# Patient Record
Sex: Male | Born: 1965 | Race: White | Hispanic: No | Smoking: Current every day smoker
Health system: Southern US, Community
[De-identification: ages and names within clinical notes are randomized; demographics above are authoritative.]

## PROBLEM LIST (undated history)

## (undated) ENCOUNTER — Emergency Department (HOSPITAL_COMMUNITY): Admission: EM | Payer: Self-pay | Source: Home / Self Care

## (undated) DIAGNOSIS — I10 Essential (primary) hypertension: Secondary | ICD-10-CM

## (undated) DIAGNOSIS — F32A Depression, unspecified: Secondary | ICD-10-CM

## (undated) DIAGNOSIS — R4589 Other symptoms and signs involving emotional state: Secondary | ICD-10-CM

## (undated) DIAGNOSIS — F329 Major depressive disorder, single episode, unspecified: Secondary | ICD-10-CM

## (undated) DIAGNOSIS — K7010 Alcoholic hepatitis without ascites: Secondary | ICD-10-CM

## (undated) DIAGNOSIS — K922 Gastrointestinal hemorrhage, unspecified: Secondary | ICD-10-CM

## (undated) DIAGNOSIS — R569 Unspecified convulsions: Secondary | ICD-10-CM

## (undated) DIAGNOSIS — K859 Acute pancreatitis without necrosis or infection, unspecified: Secondary | ICD-10-CM

## (undated) DIAGNOSIS — F1010 Alcohol abuse, uncomplicated: Secondary | ICD-10-CM

## (undated) DIAGNOSIS — R4689 Other symptoms and signs involving appearance and behavior: Secondary | ICD-10-CM

## (undated) HISTORY — PX: LEG SURGERY: SHX1003

## (undated) HISTORY — PX: HERNIA REPAIR: SHX51

---
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
## 2015-11-03 ENCOUNTER — Emergency Department (HOSPITAL_COMMUNITY): Payer: Self-pay

## 2015-11-03 ENCOUNTER — Encounter (HOSPITAL_COMMUNITY): Payer: Self-pay

## 2015-11-03 ENCOUNTER — Emergency Department (HOSPITAL_COMMUNITY)
Admission: EM | Admit: 2015-11-03 | Discharge: 2015-11-03 | Disposition: A | Discharge status: Home / Self Care | Payer: Self-pay | Attending: Dermatology | Admitting: Dermatology

## 2015-11-03 DIAGNOSIS — S0083XA Contusion of other part of head, initial encounter: Secondary | ICD-10-CM | POA: Insufficient documentation

## 2015-11-03 DIAGNOSIS — Z5321 Procedure and treatment not carried out due to patient leaving prior to being seen by health care provider: Secondary | ICD-10-CM | POA: Insufficient documentation

## 2015-11-03 DIAGNOSIS — F17210 Nicotine dependence, cigarettes, uncomplicated: Secondary | ICD-10-CM | POA: Insufficient documentation

## 2015-11-03 DIAGNOSIS — S60222A Contusion of left hand, initial encounter: Secondary | ICD-10-CM | POA: Insufficient documentation

## 2015-11-03 DIAGNOSIS — Y939 Activity, unspecified: Secondary | ICD-10-CM | POA: Insufficient documentation

## 2015-11-03 DIAGNOSIS — Y929 Unspecified place or not applicable: Secondary | ICD-10-CM | POA: Insufficient documentation

## 2015-11-03 DIAGNOSIS — Y999 Unspecified external cause status: Secondary | ICD-10-CM | POA: Insufficient documentation
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
## 2015-11-21 ENCOUNTER — Encounter (HOSPITAL_COMMUNITY): Payer: Self-pay

## 2015-11-21 ENCOUNTER — Emergency Department (HOSPITAL_COMMUNITY): Payer: Self-pay

## 2015-11-21 ENCOUNTER — Emergency Department (HOSPITAL_COMMUNITY)
Admission: EM | Admit: 2015-11-21 | Discharge: 2015-11-22 | Disposition: A | Discharge status: Home / Self Care | Payer: Self-pay | Attending: Emergency Medicine | Admitting: Emergency Medicine

## 2015-11-21 DIAGNOSIS — Y9302 Activity, running: Secondary | ICD-10-CM | POA: Insufficient documentation

## 2015-11-21 DIAGNOSIS — F17210 Nicotine dependence, cigarettes, uncomplicated: Secondary | ICD-10-CM | POA: Insufficient documentation

## 2015-11-21 DIAGNOSIS — S0081XA Abrasion of other part of head, initial encounter: Secondary | ICD-10-CM

## 2015-11-21 DIAGNOSIS — Y999 Unspecified external cause status: Secondary | ICD-10-CM | POA: Insufficient documentation

## 2015-11-21 DIAGNOSIS — W19XXXA Unspecified fall, initial encounter: Secondary | ICD-10-CM | POA: Insufficient documentation

## 2015-11-21 DIAGNOSIS — S0292XA Unspecified fracture of facial bones, initial encounter for closed fracture: Secondary | ICD-10-CM

## 2015-11-21 DIAGNOSIS — S42002A Fracture of unspecified part of left clavicle, initial encounter for closed fracture: Secondary | ICD-10-CM

## 2015-11-21 DIAGNOSIS — Y9289 Other specified places as the place of occurrence of the external cause: Secondary | ICD-10-CM | POA: Insufficient documentation

## 2015-11-21 DIAGNOSIS — S00412A Abrasion of left ear, initial encounter: Secondary | ICD-10-CM | POA: Insufficient documentation

## 2015-11-21 DIAGNOSIS — S02401A Maxillary fracture, unspecified, initial encounter for closed fracture: Secondary | ICD-10-CM | POA: Insufficient documentation

## 2015-11-21 DIAGNOSIS — S42022A Displaced fracture of shaft of left clavicle, initial encounter for closed fracture: Secondary | ICD-10-CM | POA: Insufficient documentation

## 2015-11-21 DIAGNOSIS — S0240EA Zygomatic fracture, right side, initial encounter for closed fracture: Secondary | ICD-10-CM | POA: Insufficient documentation

## 2015-11-21 LAB — BASIC METABOLIC PANEL
Anion gap: 8 (ref 5–15)
BUN: 6 mg/dL (ref 6–20)
CALCIUM: 8.8 mg/dL — AB (ref 8.9–10.3)
CO2: 23 mmol/L (ref 22–32)
CREATININE: 0.97 mg/dL (ref 0.61–1.24)
Chloride: 104 mmol/L (ref 101–111)
GFR calc Af Amer: >60 mL/min (ref >60)
GFR calc non Af Amer: >60 mL/min (ref >60)
GLUCOSE: 100 mg/dL — AB (ref 65–99)
Potassium: 3.7 mmol/L (ref 3.5–5.1)
Sodium: 135 mmol/L (ref 135–145)

## 2015-11-21 LAB — CBC WITH DIFFERENTIAL/PLATELET
BASOS PCT: 0 %
Basophils Absolute: 0 10*3/uL (ref 0.0–0.1)
EOS PCT: 2 %
Eosinophils Absolute: 0.2 10*3/uL (ref 0.0–0.7)
HEMATOCRIT: 45.1 % (ref 39.0–52.0)
Hemoglobin: 15.6 g/dL (ref 13.0–17.0)
LYMPHS PCT: 31 %
Lymphs Abs: 2.7 10*3/uL (ref 0.7–4.0)
MCH: 33.1 pg (ref 26.0–34.0)
MCHC: 34.6 g/dL (ref 30.0–36.0)
MCV: 95.8 fL (ref 78.0–100.0)
MONO ABS: 0.9 10*3/uL (ref 0.1–1.0)
MONOS PCT: 10 %
NEUTROS ABS: 4.9 10*3/uL (ref 1.7–7.7)
Neutrophils Relative %: 57 %
Platelets: 265 10*3/uL (ref 150–400)
RBC: 4.71 MIL/uL (ref 4.22–5.81)
RDW: 12.3 % (ref 11.5–15.5)
WBC: 8.7 10*3/uL (ref 4.0–10.5)

## 2015-11-21 LAB — ETHANOL: Alcohol, Ethyl (B): 270 mg/dL — ABNORMAL HIGH (ref <5)

## 2015-11-21 MED ORDER — IBUPROFEN 800 MG PO TABS
800.0000 mg | ORAL_TABLET | Freq: Four times a day (QID) | ORAL | 0 refills | Status: DC | PRN
Start: 2015-11-21 — End: 2016-02-19

## 2015-11-21 MED ORDER — IBUPROFEN 800 MG PO TABS
800.0000 mg | ORAL_TABLET | Freq: Once | ORAL | Status: AC
  Administered 2015-11-21: 800 mg via ORAL
  Filled 2015-11-21: qty 1

## 2015-11-21 NOTE — ED Notes (Signed)
Patient fell back asleep. Waiting on results
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
## 2015-11-21 NOTE — Progress Notes (Signed)
Orthopedic Tech Progress Note Patient Details:  Barry Moore 1965/08/28 [id number]  Ortho Devices Type of Ortho Device: Arm sling Ortho Device/Splint Location: Lt arm Ortho Device/Splint Interventions: Application   Charlott Rakes 11/21/2015, 8:40 PM

## 2015-11-21 NOTE — ED Notes (Signed)
Phlebotomy at the bedside  

## 2015-11-21 NOTE — ED Notes (Signed)
Pt taken to CT and Xray

## 2015-11-22 NOTE — ED Notes (Signed)
Pt advised he was going to leave rather than wait for breakfast. Pt ambulatory.

## 2015-11-22 NOTE — ED Notes (Addendum)
Breakfast tray ordered via phone. Coffee given to pt as requested. Pt noted w/sling on arm and dressed in blue paper scrubs - watching tv. Pt w/discharge paperwork - aware may eat breakfast then leave.

## 2016-02-14 ENCOUNTER — Inpatient Hospital Stay (HOSPITAL_COMMUNITY)
Admission: EM | Admit: 2016-02-14 | Discharge: 2016-02-19 | DRG: 897 | Disposition: A | Discharge status: Home / Self Care | Payer: Self-pay | Attending: Internal Medicine | Admitting: Internal Medicine

## 2016-02-14 ENCOUNTER — Inpatient Hospital Stay (HOSPITAL_COMMUNITY)
Admission: AD | Admit: 2016-02-14 | Payer: Federal, State, Local not specified - Other | Source: Intra-hospital | Admitting: Psychiatry

## 2016-02-14 ENCOUNTER — Encounter (HOSPITAL_COMMUNITY): Payer: Self-pay

## 2016-02-14 DIAGNOSIS — F17210 Nicotine dependence, cigarettes, uncomplicated: Secondary | ICD-10-CM | POA: Diagnosis present

## 2016-02-14 DIAGNOSIS — R7989 Other specified abnormal findings of blood chemistry: Secondary | ICD-10-CM

## 2016-02-14 DIAGNOSIS — I10 Essential (primary) hypertension: Secondary | ICD-10-CM | POA: Diagnosis present

## 2016-02-14 DIAGNOSIS — F10229 Alcohol dependence with intoxication, unspecified: Secondary | ICD-10-CM | POA: Diagnosis present

## 2016-02-14 DIAGNOSIS — F10930 Alcohol use, unspecified with withdrawal, uncomplicated: Secondary | ICD-10-CM | POA: Diagnosis present

## 2016-02-14 DIAGNOSIS — F1020 Alcohol dependence, uncomplicated: Secondary | ICD-10-CM

## 2016-02-14 DIAGNOSIS — Z915 Personal history of self-harm: Secondary | ICD-10-CM

## 2016-02-14 DIAGNOSIS — D6959 Other secondary thrombocytopenia: Secondary | ICD-10-CM | POA: Diagnosis present

## 2016-02-14 DIAGNOSIS — Z833 Family history of diabetes mellitus: Secondary | ICD-10-CM

## 2016-02-14 DIAGNOSIS — F332 Major depressive disorder, recurrent severe without psychotic features: Secondary | ICD-10-CM | POA: Diagnosis present

## 2016-02-14 DIAGNOSIS — Z801 Family history of malignant neoplasm of trachea, bronchus and lung: Secondary | ICD-10-CM

## 2016-02-14 DIAGNOSIS — K700 Alcoholic fatty liver: Secondary | ICD-10-CM | POA: Diagnosis present

## 2016-02-14 DIAGNOSIS — R32 Unspecified urinary incontinence: Secondary | ICD-10-CM | POA: Diagnosis present

## 2016-02-14 DIAGNOSIS — R945 Abnormal results of liver function studies: Secondary | ICD-10-CM

## 2016-02-14 DIAGNOSIS — F319 Bipolar disorder, unspecified: Secondary | ICD-10-CM | POA: Diagnosis present

## 2016-02-14 DIAGNOSIS — Y908 Blood alcohol level of 240 mg/100 ml or more: Secondary | ICD-10-CM | POA: Diagnosis present

## 2016-02-14 DIAGNOSIS — Z825 Family history of asthma and other chronic lower respiratory diseases: Secondary | ICD-10-CM

## 2016-02-14 DIAGNOSIS — F1024 Alcohol dependence with alcohol-induced mood disorder: Secondary | ICD-10-CM | POA: Diagnosis present

## 2016-02-14 DIAGNOSIS — Z590 Homelessness: Secondary | ICD-10-CM

## 2016-02-14 DIAGNOSIS — F10239 Alcohol dependence with withdrawal, unspecified: Principal | ICD-10-CM | POA: Diagnosis present

## 2016-02-14 DIAGNOSIS — F10230 Alcohol dependence with withdrawal, uncomplicated: Secondary | ICD-10-CM | POA: Diagnosis present

## 2016-02-14 DIAGNOSIS — R45851 Suicidal ideations: Secondary | ICD-10-CM | POA: Diagnosis present

## 2016-02-14 DIAGNOSIS — D696 Thrombocytopenia, unspecified: Secondary | ICD-10-CM

## 2016-02-14 DIAGNOSIS — K7010 Alcoholic hepatitis without ascites: Secondary | ICD-10-CM | POA: Diagnosis present

## 2016-02-14 HISTORY — DX: Essential (primary) hypertension: I10

## 2016-02-14 LAB — CBC WITH DIFFERENTIAL/PLATELET
BASOS ABS: 0 10*3/uL (ref 0.0–0.1)
Basophils Relative: 1 %
EOS ABS: 0.2 10*3/uL (ref 0.0–0.7)
EOS PCT: 4 %
HCT: 44.7 % (ref 39.0–52.0)
Hemoglobin: 15.6 g/dL (ref 13.0–17.0)
Lymphocytes Relative: 28 %
Lymphs Abs: 1.8 10*3/uL (ref 0.7–4.0)
MCH: 33.3 pg (ref 26.0–34.0)
MCHC: 34.9 g/dL (ref 30.0–36.0)
MCV: 95.5 fL (ref 78.0–100.0)
MONO ABS: 0.9 10*3/uL (ref 0.1–1.0)
Monocytes Relative: 14 %
Neutro Abs: 3.4 10*3/uL (ref 1.7–7.7)
Neutrophils Relative %: 53 %
PLATELETS: 149 10*3/uL — AB (ref 150–400)
RBC: 4.68 MIL/uL (ref 4.22–5.81)
RDW: 13.9 % (ref 11.5–15.5)
WBC: 6.3 10*3/uL (ref 4.0–10.5)

## 2016-02-14 LAB — COMPREHENSIVE METABOLIC PANEL
ALT: 153 U/L — AB (ref 17–63)
AST: 191 U/L — ABNORMAL HIGH (ref 15–41)
Albumin: 4.7 g/dL (ref 3.5–5.0)
Alkaline Phosphatase: 137 U/L — ABNORMAL HIGH (ref 38–126)
Anion gap: 10 (ref 5–15)
BUN: 11 mg/dL (ref 6–20)
CHLORIDE: 105 mmol/L (ref 101–111)
CO2: 25 mmol/L (ref 22–32)
CREATININE: 0.75 mg/dL (ref 0.61–1.24)
Calcium: 9.3 mg/dL (ref 8.9–10.3)
GFR calc non Af Amer: >60 mL/min (ref >60)
Glucose, Bld: 85 mg/dL (ref 65–99)
POTASSIUM: 4.1 mmol/L (ref 3.5–5.1)
SODIUM: 140 mmol/L (ref 135–145)
Total Bilirubin: 0.7 mg/dL (ref 0.3–1.2)
Total Protein: 8.2 g/dL — ABNORMAL HIGH (ref 6.5–8.1)

## 2016-02-14 LAB — RAPID URINE DRUG SCREEN, HOSP PERFORMED
AMPHETAMINES: NOT DETECTED
BARBITURATES: NOT DETECTED
BENZODIAZEPINES: NOT DETECTED
Cocaine: NOT DETECTED
Opiates: NOT DETECTED
Tetrahydrocannabinol: NOT DETECTED

## 2016-02-14 LAB — MAGNESIUM: MAGNESIUM: 2.3 mg/dL (ref 1.7–2.4)

## 2016-02-14 MED ORDER — ONDANSETRON HCL 4 MG PO TABS: 4.0000 mg | ORAL_TABLET | Freq: Four times a day (QID) | ORAL | Status: DC | PRN

## 2016-02-14 MED ORDER — LORAZEPAM 1 MG PO TABS
0.0000 mg | ORAL_TABLET | Freq: Four times a day (QID) | ORAL | Status: DC
  Administered 2016-02-14: 2 mg via ORAL
  Filled 2016-02-14 (×2): qty 2

## 2016-02-14 MED ORDER — NICOTINE 21 MG/24HR TD PT24
21.0000 mg | MEDICATED_PATCH | Freq: Every day | TRANSDERMAL | Status: DC
  Administered 2016-02-16 – 2016-02-19 (×4): 21 mg via TRANSDERMAL
  Filled 2016-02-14 (×4): qty 1

## 2016-02-14 MED ORDER — ENOXAPARIN SODIUM 40 MG/0.4ML ~~LOC~~ SOLN
40.0000 mg | Freq: Every day | SUBCUTANEOUS | Status: DC
  Administered 2016-02-15 – 2016-02-18 (×5): 40 mg via SUBCUTANEOUS
  Filled 2016-02-14 (×5): qty 0.4

## 2016-02-14 MED ORDER — THIAMINE HCL 100 MG/ML IJ SOLN
Freq: Once | INTRAVENOUS | Status: AC
  Administered 2016-02-14: via INTRAVENOUS
  Filled 2016-02-14: qty 1000

## 2016-02-14 MED ORDER — ALUM & MAG HYDROXIDE-SIMETH 200-200-20 MG/5ML PO SUSP: 30.0000 mL | ORAL | Status: DC | PRN

## 2016-02-14 MED ORDER — LORAZEPAM 2 MG/ML IJ SOLN
2.0000 mg | INTRAMUSCULAR | Status: DC | PRN
  Administered 2016-02-14 – 2016-02-18 (×25): 2 mg via INTRAVENOUS
  Filled 2016-02-14 (×25): qty 1

## 2016-02-14 MED ORDER — ONDANSETRON HCL 4 MG/2ML IJ SOLN
4.0000 mg | Freq: Four times a day (QID) | INTRAMUSCULAR | Status: DC | PRN
  Administered 2016-02-14: 4 mg via INTRAVENOUS
  Filled 2016-02-14: qty 2

## 2016-02-14 MED ORDER — LORAZEPAM 1 MG PO TABS
0.0000 mg | ORAL_TABLET | Freq: Two times a day (BID) | ORAL | Status: DC
  Administered 2016-02-14: 2 mg via ORAL

## 2016-02-14 MED ORDER — ONDANSETRON HCL 4 MG PO TABS: 4.0000 mg | ORAL_TABLET | Freq: Three times a day (TID) | ORAL | Status: DC | PRN

## 2016-02-14 MED ORDER — LORAZEPAM 2 MG/ML IJ SOLN
1.0000 mg | Freq: Once | INTRAMUSCULAR | Status: AC
  Administered 2016-02-14: 1 mg via INTRAVENOUS
  Filled 2016-02-14: qty 1

## 2016-02-14 MED ORDER — LORAZEPAM 1 MG PO TABS
2.0000 mg | ORAL_TABLET | Freq: Once | ORAL | Status: AC
  Administered 2016-02-14: 2 mg via ORAL
  Filled 2016-02-14: qty 2

## 2016-02-14 NOTE — H&P (Addendum)
History and Physical  Patient Name: Barry Moore     [id number]    DOB: Aug 24, 1965    DOA: 02/14/2016 PCP: No PCP Per Patient   Patient coming from: Tent  Chief Complaint: Suicidality  HPI: Barry Moore is a 50 y.o. male with a past medical history significant for homelessness, alcohol use disorder severe and depression who presents with suicidality and alcohol withdrawal.  The patient consumes 1 quart of spirits daily, plus beer, has for years (last sober briefly about three months ago).  His last drink was this morning around 9 or 9:30A, after which he called EMS stating he was thinking of hurting himself and he felt bad.  ED course: -Heart rate 87, RR 20,l BP 126/91, pulse ox 96% -Na 140, K 4.1, Cr 0.75, WBC 6.3K, Hgb 15.6 -Alcohol level 371 mg/dL -AST/ALT 191 and 153, respectively -UDS negative -He was evaluated by TTS and accepted to a bed at [facility] for suicidality, when it was noted that his CIWA (within 12 hrs of last drink) was 15.  He was given lorazepam 2 mg PO and repeat CIWA 14.  He was given additional lorazepam 2 mg PO and repeat CIWA was 13 and so TRH were asked to evaluate for alcohol withdrawal  The patient reports a history of alcohol withdrawal seizure but never delirium or intubation.     ROS: Review of Systems  Constitutional: Positive for diaphoresis.  Neurological: Positive for tremors and headaches.  Psychiatric/Behavioral: Positive for depression and suicidal ideas. The patient is nervous/anxious.   All other systems reviewed and are negative.         Past Medical History:  Diagnosis Date  . Hypertension     History reviewed. No pertinent surgical history.  Social History: Patient lives in a tent.  The patient walks unassisted.  He is from [place].  He smokes.  He drinks lately about 1 fifth of liquor daily, plus beer.  Denies IVDU.  Allergies  Allergen Reactions  . Aspirin Nausea And Vomiting    Family history: family history includes  Diabetes in his mother; Emphysema in his father; Lung cancer in his father.  Prior to Admission medications   Medication Sig Start Date End Date Taking? Authorizing Provider  ibuprofen (ADVIL,MOTRIN) 800 MG tablet Take 1 tablet (800 mg total) by mouth every 6 (six) hours as needed for moderate pain. Patient not taking: Reported on 02/14/2016 11/21/15   Leo Grosser, MD       Physical Exam: BP 149/98   Pulse 104   Resp 26   SpO2 96%  General appearance: Well-developed, adult male, sleeping but easily roused.  Malodorous, hair matted.   Eyes: Anicteric, conjunctiva pink, lids and lashes normal. PERRL.    ENT: No nasal deformity, discharge, epistaxis.  Hearing normal. OP moist without lesions.  Dentures. Skin: Diaphoretic, beads on forehead.  Warm and dry.  No jaundice.  No suspicious rashes or lesions. Cardiac: Tachycardic, regular, nl S1-S2, no murmurs appreciated.  Capillary refill is brisk.  JVP normal.  No LE edema.  Radial and DP pulses 2+ and symmetric. Respiratory: Normal respiratory rate and rhythm.  CTAB without rales or wheezes. Abdomen: Abdomen soft.  No TTP. No ascites, distension.  Liver edge palpated.   MSK: No deformities or effusions.  No cyanosis or clubbing. Neuro: Cranial nerves grossly normal.  Sensation intact to light touch. Speech is fluent.  Muscle strength normal.  Tremor slight at rest, worse with arms extended.    Psych: Sensorium intact and  responding to questions, attention normal but sleepy.  Behavior appropriate.  Affect blunted.     Labs on Admission:  I have personally reviewed following labs and imaging studies: CBC:  Recent Labs Lab 02/14/16 1040  WBC 6.3  NEUTROABS 3.4  HGB 15.6  HCT 44.7  MCV 95.5  PLT 123456*   Basic Metabolic Panel:  Recent Labs Lab 02/14/16 1040  NA 140  K 4.1  CL 105  CO2 25  GLUCOSE 85  BUN 11  CREATININE 0.75  CALCIUM 9.3   GFR: CrCl cannot be calculated (Unknown ideal weight.).  Liver Function  Tests:  Recent Labs Lab 02/14/16 1040  AST 191*  ALT 153*  ALKPHOS 137*  BILITOT 0.7  PROT 8.2*  ALBUMIN 4.7   No results for input(s): LIPASE, AMYLASE in the last 168 hours. No results for input(s): AMMONIA in the last 168 hours. Coagulation Profile: No results for input(s): INR, PROTIME in the last 168 hours. Cardiac Enzymes: No results for input(s): CKTOTAL, CKMB, CKMBINDEX, TROPONINI in the last 168 hours. BNP (last 3 results) No results for input(s): PROBNP in the last 8760 hours. HbA1C: No results for input(s): HGBA1C in the last 72 hours. CBG: No results for input(s): GLUCAP in the last 168 hours. Lipid Profile: No results for input(s): CHOL, HDL, LDLCALC, TRIG, CHOLHDL, LDLDIRECT in the last 72 hours. Thyroid Function Tests: No results for input(s): TSH, T4TOTAL, FREET4, T3FREE, THYROIDAB in the last 72 hours. Anemia Panel: No results for input(s): VITAMINB12, FOLATE, FERRITIN, TIBC, IRON, RETICCTPCT in the last 72 hours. Sepsis Labs: Invalid input(s): PROCALCITONIN, LACTICIDVEN No results found for this or any previous visit (from the past 240 hour(s)).          Assessment/Plan  1. Alcohol withdrawal:  Withdrawal symptoms at presentation while EtOH level still >300 mg/dL.  History of seizures.   -Admit to stepdown -CIWA q4hrs -Lorazepam 2-3 mg PRN -Thiamine and folate supplementation -IVF -Seizure precautions   2. Suicidality:  -1:1 sitter -Consult to TTS -Transfer to [facility] when CIWA stable <8  3. Alcoholic hepatitis, mild:  -IVF -Alcohol abstinence discussed, patient resolved to quit  4. Thrombocytopenia, mild:  Likely from alcohol           DVT prophylaxis: Lovenox  Code Status: FULL  Family Communication: None present  Disposition Plan: Anticipate lorazepam per CIWA protocol and close monitoring in stepdown Consults called: None Admission status: INPATIENT         Medical decision making: Patient seen at 10:00 PM on  02/14/2016.  The patient was discussed with Dr. Ralene Bathe.  What exists of the patient's chart was reviewed in depth and summarized above.  Clinical condition: CIWA persistently 13-15 despite 5 mg lorazepam.  Will monitor CIWA closely.        Edwin Dada [facility] Pager [phone]

## 2016-02-14 NOTE — ED Provider Notes (Signed)
[facility] DEPT Provider Note   CSN: [id number] Arrival date & time: 02/14/16  [id number]     History   Chief Complaint Chief Complaint  Patient presents with  . Suicidal  . Alcohol Intoxication    HPI Barry Moore is a 50 y.o. male.  50 year old male with history of bipolar disorder presents with suicidal ideations due to being homeless. He has a plan to cut his wrists or take himself to death. Reportedly had a seizure prior to arrival here. Patient admits to bladder incontinence. Denies any intentional ingestions at this time. Does have a prior history of suicide attempt. Denies responding to internal stimuli. No homicidal ideations. Has a history of alcohol withdrawal seizures and he did self medicate with alcohol consisting of 2-40 ounce beers as well as a fifth of liquor. Called EMS and was transported here      History reviewed. No pertinent past medical history.  There are no active problems to display for this patient.   History reviewed. No pertinent surgical history.     Home Medications    Prior to Admission medications   Medication Sig Start Date End Date Taking? Authorizing Provider  ibuprofen (ADVIL,MOTRIN) 800 MG tablet Take 1 tablet (800 mg total) by mouth every 6 (six) hours as needed for moderate pain. 11/21/15   Leo Grosser, MD    Family History History reviewed. No pertinent family history.  Social History Social History  Substance Use Topics  . Smoking status: Current Every Day Smoker    Packs/day: 2.00    Types: Cigarettes  . Smokeless tobacco: Never Used  . Alcohol use Yes     Comment: Two Forty's      Allergies   Aspirin   Review of Systems Review of Systems  All other systems reviewed and are negative.    Physical Exam Updated Vital Signs BP 126/91 (BP Location: Right Arm)   Pulse 87   Resp 20   SpO2 96%   Physical Exam  Constitutional: He is oriented to person, place, and time. He appears well-developed and  well-nourished.  Non-toxic appearance. No distress.  HENT:  Head: Normocephalic and atraumatic.  Eyes: Conjunctivae, EOM and lids are normal. Pupils are equal, round, and reactive to light.  Neck: Normal range of motion. Neck supple. No tracheal deviation present. No thyroid mass present.  Cardiovascular: Normal rate, regular rhythm and normal heart sounds.  Exam reveals no gallop.   No murmur heard. Pulmonary/Chest: Effort normal and breath sounds normal. No stridor. No respiratory distress. He has no decreased breath sounds. He has no wheezes. He has no rhonchi. He has no rales.  Abdominal: Soft. Normal appearance and bowel sounds are normal. He exhibits no distension. There is no tenderness. There is no rebound and no CVA tenderness.  Musculoskeletal: Normal range of motion. He exhibits no edema or tenderness.  Neurological: He is alert and oriented to person, place, and time. He has normal strength. No cranial nerve deficit or sensory deficit. GCS eye subscore is 4. GCS verbal subscore is 5. GCS motor subscore is 6.  Skin: Skin is warm and dry. No abrasion and no rash noted.  Psychiatric: His speech is normal. His affect is blunt. He is withdrawn. Thought content is not paranoid and not delusional. He expresses suicidal ideation. He expresses no homicidal ideation. He expresses suicidal plans. He expresses no homicidal plans.  Nursing note and vitals reviewed.    ED Treatments / Results  Labs (all labs ordered are listed, but only  abnormal results are displayed) Labs Reviewed  ETHANOL  RAPID URINE DRUG SCREEN, HOSP PERFORMED  CBC WITH DIFFERENTIAL/PLATELET  COMPREHENSIVE METABOLIC PANEL    EKG  EKG Interpretation None       Radiology No results found.  Procedures Procedures (including critical care time)  Medications Ordered in ED Medications  LORazepam (ATIVAN) tablet 2 mg (not administered)     Initial Impression / Assessment and Plan / ED Course  I have reviewed  the triage vital signs and the nursing notes.  Pertinent labs & imaging results that were available during my care of the patient were reviewed by me and considered in my medical decision making (see chart for details).  Clinical Course     Patient given Ativan here and CIWA score noted. He is resting comfortably. Patient has elevated LFTs likely from his alcohol use. He has active suicidal ideations and I have consult to psychiatry for placement  Final Clinical Impressions(s) / ED Diagnoses   Final diagnoses:  None    New Prescriptions New Prescriptions   No medications on file     Lacretia Leigh, MD 02/14/16 1325

## 2016-02-14 NOTE — ED Notes (Signed)
Dr Zenia Resides made aware of critical ETOH

## 2016-02-14 NOTE — ED Triage Notes (Signed)
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

## 2016-02-14 NOTE — ED Notes (Signed)
Bed: [id number] Expected date: 02/14/16 Expected time: 9:57 AM Means of arrival: Ambulance Comments: ETOH SI

## 2016-02-14 NOTE — BH Assessment (Signed)
[facility] Assessment Progress Note    Patient has been accepted to [facility] at 8pm

## 2016-02-14 NOTE — BH Assessment (Signed)
[facility] Assessment Progress Note   Case was staffed with Lord DNP who recommended an inpatient admission as appropriate bed placement is investigated.     
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
## 2016-02-14 NOTE — BH Assessment (Addendum)
Assessment Note  Barry Moore is an 50 y.o. male that presents this date with thoughts of self harm with a plan to cut his wrist. Patient contacted EMS earlier this date after consuming over 1/5th of liquor and "a lot of beer." Patient has been residing in a tent with some friends reporting he has been homeless for months now. Patient stated he has lost his employment, family and home within the last six months and has been living in a tent with three other people until today. Patient stated they had a verbal altercation this date and they ask patient to leave. Patient stated he was "very overwhelmed with feelings of ending it" and decided to "get really drunk" and cut his wrist. Patient stated he was "too drunk" to hold a knife and contacted EMS to transport him to [facility] to get "some help." Patient reports at least 2 prior attempts at self harm but cannot remember when or where he was treated stating it was "a long time ago." Patient denies any OP treatment or ever being on any MH medications. Patient was still impaired at the time of assessment and was drowsy speaking in a soft slurred voice. Patient is oriented to time/place but is a poor historian. Patient reports daily use of alcohol "for years" but is unable to recall the amount he consumes daily. Patient states "I drink as much of everything I can get everyday." Patient denies any other illicit SA use. Patient does report current withdrawals to include: increased agitation, weakness and tremors. Patient also reports ongoing depression to include symptoms of feelings hopeless and "guilt" over his life. 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 No pertinent past medical history.  History reviewed. No pertinent surgical history.  Family History: History reviewed. No pertinent family history.  Social History:  reports that he has been smoking Cigarettes.  He has been smoking about 2.00 packs per day. He has never used smokeless tobacco. He reports that he drinks alcohol. He reports that he does not use drugs.  Additional Social History:  Alcohol / Drug Use Pain Medications: See MAR Prescriptions: See MAR Over the Counter: See MAR History of alcohol / drug use?: Yes Longest period of sobriety (when/how long): Unknown Withdrawal Symptoms: Tremors, Cramps, Agitation Substance #1 Name of Substance 1: Alcohol 1 - Age of First Use: 20 1 - Amount (size/oz): 12 oz beers 1 - Frequency: daily 1 - Duration: Unknown 1 - Last Use / Amount: 02/14/16 Unknown amount  CIWA: CIWA-Ar BP: 128/89 Pulse Rate: 104 Nausea and Vomiting: 3 Tactile Disturbances: very mild itching, pins and needles, burning or numbness Tremor: two Auditory Disturbances: not present Paroxysmal Sweats: no sweat visible Visual Disturbances: not present Anxiety: three Headache, Fullness in Head: moderately severe Agitation: somewhat more than normal activity Orientation and Clouding of Sensorium: oriented and can  do serial additions CIWA-Ar Total: 14 COWS:    Allergies:  Allergies  Allergen Reactions  . Aspirin Nausea And Vomiting    Home Medications:  (Not in a hospital admission)  OB/GYN Status:  No LMP for male patient.  General Assessment Data Location of Assessment: [facility] TTS Assessment: In system Is this a  Tele or Face-to-Face Assessment?: Face-to-Face Is this an Initial Assessment or a Re-assessment for this encounter?: Initial Assessment Marital status: Single Maiden name: na Is patient pregnant?: No Pregnancy Status: No Living Arrangements: Alone Can pt return to current living arrangement?: Yes Admission Status: Voluntary Is patient capable of signing voluntary admission?: Yes Referral Source: Self/Family/Friend Insurance type: None  Medical Screening Exam ([place]) Medical Exam completed: Yes  Crisis Care Plan Living Arrangements: Alone Legal Guardian:  (na) Name of Psychiatrist: None Name of Therapist: None  Education Status Is patient currently in school?: No Current Grade: na Highest grade of school patient has completed: 65 Name of school: na Contact person: na  Risk to self with the past 6 months Suicidal Ideation: Yes-Currently Present Has patient been a risk to self within the past 6 months prior to admission? : No Suicidal Intent: Yes-Currently Present Has patient had any suicidal intent within the past 6 months prior to admission? : No Is patient at risk for suicide?: Yes Suicidal Plan?: Yes-Currently Present Has patient had any suicidal plan within the past 6 months prior to admission? : No Specify Current Suicidal Plan: Cut wrist with knife Access to Means: Yes Specify Access to Suicidal Means: pt has knife What has been your use of drugs/alcohol within the last 12 months?: current use Previous Attempts/Gestures: Yes How many times?: 2 Other Self Harm Risks: na Triggers for Past Attempts: Unknown Intentional Self Injurious Behavior: None Family Suicide History: No Recent stressful life event(s): Other (Comment) (homeless) Persecutory voices/beliefs?: No Depression: Yes Depression Symptoms: Loss of interest in usual pleasures, Feeling worthless/self pity Substance abuse history and/or treatment for substance abuse?: No Suicide prevention  information given to non-admitted patients: Not applicable  Risk to Others within the past 6 months Homicidal Ideation: No Does patient have any lifetime risk of violence toward others beyond the six months prior to admission? : No Thoughts of Harm to Others: No Current Homicidal Intent: No Current Homicidal Plan: No Access to Homicidal Means: No Identified Victim: na History of harm to others?: No Assessment of Violence: None Noted Violent Behavior Description: na Does patient have access to weapons?: No Criminal Charges Pending?: No Does patient have a court date: No Is patient on probation?: No  Psychosis Hallucinations: None noted Delusions: None noted  Mental Status Report Appearance/Hygiene: In scrubs Eye Contact: Poor Motor Activity: Unremarkable Speech: Slow, Slurred Level of Consciousness: Drowsy Mood: Depressed Affect: Depressed Anxiety Level: Minimal Thought Processes: Coherent, Relevant Judgement: Partial Orientation: Person, Place, Time Obsessive Compulsive Thoughts/Behaviors: None  Cognitive Functioning Concentration: Decreased Memory: Recent Intact IQ: Average Insight: Poor Impulse Control: Poor Appetite: Poor Weight Loss: 10 Weight Gain: 0 Sleep: Decreased Total Hours of Sleep: 5 Vegetative Symptoms: None  ADLScreening [facility] Assessment Services) Patient's cognitive ability adequate to safely complete daily activities?: Yes Patient able to express need for assistance with ADLs?: Yes Independently performs ADLs?: Yes (appropriate for developmental age)  Prior Inpatient Therapy Prior Inpatient Therapy: Yes Prior Therapy Dates:  (pt cannot remember) Prior Therapy Facilty/Provider(s):  (pt does not remember) Reason for Treatment:  (SI attempt)  Prior Outpatient Therapy Prior Outpatient Therapy: No Prior Therapy Dates:  na Prior Therapy Facilty/Provider(s): na Reason for Treatment: na Does patient have an ACCT team?: No Does patient have  Intensive In-House Services?  : No Does patient have [facility] services? : No Does patient have P4CC services?: No  ADL Screening (condition at time of admission) Patient's cognitive ability adequate to safely complete daily activities?: Yes Is the patient deaf or have difficulty hearing?: No Does the patient have difficulty seeing, even when wearing glasses/contacts?: No Does the patient have difficulty concentrating, remembering, or making decisions?: No Patient able to express need for assistance with ADLs?: Yes Does the patient have difficulty dressing or bathing?: No Independently performs ADLs?: Yes (appropriate for developmental age) Does the patient have difficulty walking or climbing stairs?: No Weakness of Legs: None Weakness of Arms/Hands: None  Home Assistive Devices/Equipment Home Assistive Devices/Equipment: Cane (specify quad or straight) (straight)  Therapy Consults (therapy consults require a physician order) PT Evaluation Needed: No OT Evalulation Needed: No SLP Evaluation Needed: No Abuse/Neglect Assessment (Assessment to be complete while patient is alone) Physical Abuse: Denies Verbal Abuse: Denies Sexual Abuse: Denies Exploitation of patient/patient's resources: Denies Self-Neglect: Denies Values / Beliefs Cultural Requests During Hospitalization: None Spiritual Requests During Hospitalization: None Consults Spiritual Care Consult Needed: No Social Work Consult Needed: No Regulatory affairs officer (For Healthcare) Does Patient Have a Medical Advance Directive?: No    Additional Information 1:1 In Past 12 Months?: No CIRT Risk: No Elopement Risk: No Does patient have medical clearance?: Yes     Disposition:  Disposition Initial Assessment Completed for this Encounter: Yes Disposition of Patient: Other dispositions Other disposition(s): Other (Comment) (pt to be re-evaluated in the morning)  On Site Evaluation by:   Reviewed with Physician:    Mamie Nick 02/14/2016 3:19 PM

## 2016-02-15 ENCOUNTER — Inpatient Hospital Stay (HOSPITAL_COMMUNITY): Payer: Self-pay

## 2016-02-15 LAB — CBC
HCT: 40.4 % (ref 39.0–52.0)
HEMOGLOBIN: 13.8 g/dL (ref 13.0–17.0)
MCH: 32.8 pg (ref 26.0–34.0)
MCHC: 34.2 g/dL (ref 30.0–36.0)
MCV: 96 fL (ref 78.0–100.0)
Platelets: 142 10*3/uL — ABNORMAL LOW (ref 150–400)
RBC: 4.21 MIL/uL — ABNORMAL LOW (ref 4.22–5.81)
RDW: 13.8 % (ref 11.5–15.5)
WBC: 8.3 10*3/uL (ref 4.0–10.5)

## 2016-02-15 LAB — COMPREHENSIVE METABOLIC PANEL
ALBUMIN: 4 g/dL (ref 3.5–5.0)
ALK PHOS: 126 U/L (ref 38–126)
ALT: 124 U/L — AB (ref 17–63)
ANION GAP: 7 (ref 5–15)
AST: 125 U/L — ABNORMAL HIGH (ref 15–41)
BILIRUBIN TOTAL: 1.2 mg/dL (ref 0.3–1.2)
BUN: 16 mg/dL (ref 6–20)
CALCIUM: 9.3 mg/dL (ref 8.9–10.3)
CO2: 28 mmol/L (ref 22–32)
CREATININE: 0.76 mg/dL (ref 0.61–1.24)
Chloride: 105 mmol/L (ref 101–111)
GFR calc Af Amer: >60 mL/min (ref >60)
GFR calc non Af Amer: >60 mL/min (ref >60)
GLUCOSE: 105 mg/dL — AB (ref 65–99)
Potassium: 3.9 mmol/L (ref 3.5–5.1)
Sodium: 140 mmol/L (ref 135–145)
TOTAL PROTEIN: 7.2 g/dL (ref 6.5–8.1)

## 2016-02-15 LAB — CK: CK TOTAL: 110 U/L (ref 49–397)

## 2016-02-15 LAB — VITAMIN B12: Vitamin B-12: 420 pg/mL (ref 180–914)

## 2016-02-15 LAB — AMMONIA: Ammonia: 51 umol/L — ABNORMAL HIGH (ref 9–35)

## 2016-02-15 LAB — FOLATE: Folate: 46.3 ng/mL (ref >5.9)

## 2016-02-15 LAB — LIPASE, BLOOD: LIPASE: 36 U/L (ref 11–51)

## 2016-02-15 LAB — PROTIME-INR
INR: 0.96
INR: 0.93
Prothrombin Time: 12.8 seconds (ref 11.4–15.2)
Prothrombin Time: 12.5 seconds (ref 11.4–15.2)

## 2016-02-15 LAB — MRSA PCR SCREENING: MRSA BY PCR: NEGATIVE

## 2016-02-15 MED ORDER — SODIUM CHLORIDE 0.9 % IV SOLN
INTRAVENOUS | Status: AC
  Administered 2016-02-15 – 2016-02-16 (×2): via INTRAVENOUS

## 2016-02-15 MED ORDER — HYDRALAZINE HCL 20 MG/ML IJ SOLN
10.0000 mg | Freq: Once | INTRAMUSCULAR | Status: AC
  Administered 2016-02-15: 10 mg via INTRAVENOUS
  Filled 2016-02-15: qty 1

## 2016-02-15 MED ORDER — HYDRALAZINE HCL 20 MG/ML IJ SOLN
10.0000 mg | Freq: Four times a day (QID) | INTRAMUSCULAR | Status: DC | PRN
  Administered 2016-02-16 – 2016-02-17 (×2): 10 mg via INTRAVENOUS
  Filled 2016-02-15 (×2): qty 1

## 2016-02-15 NOTE — Progress Notes (Signed)
PROGRESS NOTE  Barry Moore [id number] DOB: February 08, 1966 DOA: 02/14/2016 PCP: No PCP Per Patient  HPI/Recap of past 24 hours:  Feeling shaky, denies pain, no fever, no confusion or agitation Sitter in room  Assessment/Plan: Principal Problem:   Severe alcohol withdrawal without perceptual disturbances without complication (HCC) Active Problems:   Major depressive disorder, recurrent severe without psychotic features ([place])   Alcohol use disorder, severe, dependence ([place])   Alcoholic hepatitis without ascites   Thrombocytopenia (HCC)  Alcohol intoxication/alcohol withdrawal Withdrawal symptoms at presentation while EtOH level still >300 mg/dL.  History of seizures.   -Admit to stepdown -CIWA q4hrs -Lorazepam 2-3 mg PRN -Thiamine and folate supplementation -IVF -Seizure precautions  Suicidality:  -uds negative, will check tsh, ammonia level -1:1 sitter -Consult to TTS -Transfer to [facility] when CIWA stable <8   Elevated ast/alt (191/153) with normal alk phos, normal tbili From alcohol? Will check ck, hepatitis panel, liver US uds negative, tylenol level not checked   Mild thrombocytopenia: plt 265 in 11/2015, 149 on admission, possible from alcohol use, will check b12/folate  Alcohol use and smoker: education provided, on ciwa protocol, nicotine patch provided  Homeless, has been on the street since 08/2015  Code Status: full  Family Communication: patient   Disposition Plan: likely will need inpatient psych placement    Consultants:  Psych  Social worker  Procedures:  none  Antibiotics:  none   Objective: BP (!) 158/99 (BP Location: Left Arm)   Pulse 82   Temp 98.2 F (36.8 C) (Oral)   Resp 20   Ht _0  (1.702 m)   Wt 75.5 kg (166 lb 7.2 oz)   SpO2 96%   BMI 26.07 kg/m   Intake/Output Summary (Last 24 hours) at 02/15/16 0910 Last data filed at 02/15/16 0559  Gross per 24 hour  Intake              480 ml  Output              700 ml  Net              -220 ml   Filed Weights   02/14/16 2340  Weight: 75.5 kg (166 lb 7.2 oz)    Exam:   General:  unkempt, flat affect, mild tremor  Cardiovascular: RRR  Respiratory: CTABL  Abdomen: Soft/ND/NT, positive BS  Musculoskeletal: No Edema  Neuro: aaox3  Data Reviewed: Basic Metabolic Panel:  Recent Labs Lab 02/14/16 1040 02/14/16 1053 02/15/16 0332  NA 140  --  140  K 4.1  --  3.9  CL 105  --  105  CO2 25  --  28  GLUCOSE 85  --  105*  BUN 11  --  16  CREATININE 0.75  --  0.76  CALCIUM 9.3  --  9.3  MG  --  2.3  --    Liver Function Tests:  Recent Labs Lab 02/14/16 1040 02/15/16 0332  AST 191* 125*  ALT 153* 124*  ALKPHOS 137* 126  BILITOT 0.7 1.2  PROT 8.2* 7.2  ALBUMIN 4.7 4.0   No results for input(s): LIPASE, AMYLASE in the last 168 hours. No results for input(s): AMMONIA in the last 168 hours. CBC:  Recent Labs Lab 02/14/16 1040 02/15/16 0332  WBC 6.3 8.3  NEUTROABS 3.4  --   HGB 15.6 13.8  HCT 44.7 40.4  MCV 95.5 96.0  PLT 149* 142*   Cardiac Enzymes:   No results for input(s): CKTOTAL, CKMB, CKMBINDEX,  TROPONINI in the last 168 hours. BNP (last 3 results) No results for input(s): BNP in the last 8760 hours.  ProBNP (last 3 results) No results for input(s): PROBNP in the last 8760 hours.  CBG: No results for input(s): GLUCAP in the last 168 hours.  Recent Results (from the past 240 hour(s))  MRSA PCR Screening     Status: None   Collection Time: 02/14/16 11:40 PM  Result Value Ref Range Status   MRSA by PCR NEGATIVE NEGATIVE Final    Comment:        The GeneXpert MRSA Assay (FDA approved for NASAL specimens only), is one component of a comprehensive MRSA colonization surveillance program. It is not intended to diagnose MRSA infection nor to guide or monitor treatment for MRSA infections.      Studies: No results found.  Scheduled Meds: . enoxaparin (LOVENOX) injection  40 mg Subcutaneous QHS  . nicotine  21  mg Transdermal Daily    Continuous Infusions: . sodium chloride       Time spent: 34mns  Nghia Mcentee MD, PhD  [facility] Pager [phone] If 7PM-7AM, please contact night-coverage at [email or web address], password [email or web address]11/26/2017, 9:10 AM  LOS: 1 day

## 2016-02-16 DIAGNOSIS — Z833 Family history of diabetes mellitus: Secondary | ICD-10-CM | POA: Diagnosis not present

## 2016-02-16 DIAGNOSIS — F17210 Nicotine dependence, cigarettes, uncomplicated: Secondary | ICD-10-CM

## 2016-02-16 DIAGNOSIS — F1094 Alcohol use, unspecified with alcohol-induced mood disorder: Secondary | ICD-10-CM | POA: Diagnosis not present

## 2016-02-16 DIAGNOSIS — F10239 Alcohol dependence with withdrawal, unspecified: Secondary | ICD-10-CM | POA: Diagnosis not present

## 2016-02-16 DIAGNOSIS — F332 Major depressive disorder, recurrent severe without psychotic features: Secondary | ICD-10-CM | POA: Diagnosis not present

## 2016-02-16 DIAGNOSIS — K7010 Alcoholic hepatitis without ascites: Secondary | ICD-10-CM

## 2016-02-16 DIAGNOSIS — Z801 Family history of malignant neoplasm of trachea, bronchus and lung: Secondary | ICD-10-CM

## 2016-02-16 DIAGNOSIS — R45851 Suicidal ideations: Secondary | ICD-10-CM

## 2016-02-16 DIAGNOSIS — Z79899 Other long term (current) drug therapy: Secondary | ICD-10-CM

## 2016-02-16 LAB — CBC WITH DIFFERENTIAL/PLATELET
Basophils Absolute: 0 10*3/uL (ref 0.0–0.1)
Basophils Relative: 0 %
Eosinophils Absolute: 0.2 10*3/uL (ref 0.0–0.7)
Eosinophils Relative: 2 %
HEMATOCRIT: 39.3 % (ref 39.0–52.0)
HEMOGLOBIN: 13.4 g/dL (ref 13.0–17.0)
Lymphocytes Relative: 20 %
Lymphs Abs: 1.3 10*3/uL (ref 0.7–4.0)
MCH: 32.8 pg (ref 26.0–34.0)
MCHC: 34.1 g/dL (ref 30.0–36.0)
MCV: 96.1 fL (ref 78.0–100.0)
MONO ABS: 0.9 10*3/uL (ref 0.1–1.0)
MONOS PCT: 15 %
NEUTROS ABS: 3.9 10*3/uL (ref 1.7–7.7)
NEUTROS PCT: 63 %
Platelets: 134 10*3/uL — ABNORMAL LOW (ref 150–400)
RBC: 4.09 MIL/uL — ABNORMAL LOW (ref 4.22–5.81)
RDW: 13.7 % (ref 11.5–15.5)
WBC: 6.2 10*3/uL (ref 4.0–10.5)

## 2016-02-16 LAB — COMPREHENSIVE METABOLIC PANEL
ALK PHOS: 122 U/L (ref 38–126)
ALT: 116 U/L — ABNORMAL HIGH (ref 17–63)
AST: 112 U/L — AB (ref 15–41)
Albumin: 3.6 g/dL (ref 3.5–5.0)
Anion gap: 7 (ref 5–15)
BILIRUBIN TOTAL: 1.1 mg/dL (ref 0.3–1.2)
BUN: 13 mg/dL (ref 6–20)
CALCIUM: 8.8 mg/dL — AB (ref 8.9–10.3)
CO2: 25 mmol/L (ref 22–32)
Chloride: 106 mmol/L (ref 101–111)
Creatinine, Ser: 0.84 mg/dL (ref 0.61–1.24)
GFR calc Af Amer: >60 mL/min (ref >60)
GLUCOSE: 108 mg/dL — AB (ref 65–99)
POTASSIUM: 3.6 mmol/L (ref 3.5–5.1)
Sodium: 138 mmol/L (ref 135–145)
TOTAL PROTEIN: 6.8 g/dL (ref 6.5–8.1)

## 2016-02-16 LAB — HIV ANTIBODY (ROUTINE TESTING W REFLEX): HIV Screen 4th Generation wRfx: NONREACTIVE

## 2016-02-16 LAB — HEPATITIS PANEL, ACUTE
HEP A IGM: NEGATIVE
HEP B S AG: NEGATIVE
Hep B C IgM: NEGATIVE

## 2016-02-16 LAB — AMMONIA: AMMONIA: 37 umol/L — AB (ref 9–35)

## 2016-02-16 LAB — ETHANOL: Alcohol, Ethyl (B): 371 mg/dL (ref <5)

## 2016-02-16 NOTE — Progress Notes (Signed)
PROGRESS NOTE    Barry Moore  [id number] DOB: Apr 26, 1965 DOA: 02/14/2016 PCP: No PCP Per Patient   Brief Narrative: 50 y.o. male with a past medical history significant for homelessness, alcohol use disorder severe and depression who presents with suicidality and alcohol withdrawal.  Assessment & Plan:   #  Severe alcohol withdrawal: Patient is still has some tremor. Continue CIWA protocol. Continue vitamins, Ativan when necessary. -Education provided to the patient -Case manager evaluation for safe discharge planning. Patient is homeless.  #  Major depressive disorder, recurrent severe without psychotic features ([place]) and suicidal ideation:  -Continue bed sitter -Psychiatric consult called today morning. He may need medications, defer to psychiatrist. -Patient denied suicidal or homicidal ideation this morning  #  Alcohol use disorder, severe, dependence ([place]): Monitoring for withdrawal. Patient was educated.    # Fatty liver and elevated liver enzymes due to alcohol abuse: Hepatitis B and C negative. HIV nonreactive. Liver enzymes trending down.  # Thrombocytopenia ([place]): Due to liver disease. Stable platelet. No sign of bleeding.  DVT prophylaxis: Lovenox subcutaneous Code Status: Full code Family Communication: No family present at bedside Disposition Plan: Likely discharge in 1-2 days. Pending psychiatric consult    Consultants:   Called psych today  Procedures: None Antimicrobials: None  Subjective: Patient was seen and examined at bedside. Denied headache, dizziness, chest pain, shortness of breath. Has mild tremor and nausea. No vomiting or abdominal pain. Sitter at bedside.   Objective: Vitals:   02/16/16 0900 02/16/16 1000 02/16/16 1100 02/16/16 1200  BP:  (!) 196/92  (!) 168/101  Pulse: 85 99 85 85  Resp: 17 17 (!) 21 (!) 25  Temp:      TempSrc:      SpO2: 97% 97% 97% 98%  Weight:      Height:        Intake/Output Summary (Last 24 hours) at  02/16/16 1429 Last data filed at 02/16/16 1138  Gross per 24 hour  Intake          2026.25 ml  Output              750 ml  Net          1276.25 ml   Filed Weights   02/14/16 2340  Weight: 75.5 kg (166 lb 7.2 oz)    Examination:  General exam: Appears calm and comfortable Respiratory system: Clear to auscultation. Respiratory effort normal. No wheezing or crackle Cardiovascular system: S1 & S2 heard, RRR.  No pedal edema. Gastrointestinal system: Abdomen is nondistended, soft and nontender. Normal bowel sounds heard. Central nervous system: Alert. Awake and following commands. Extremities: Symmetric 5 x 5 power. Upper extremities tremor Skin: No rashes, lesions or ulcers Psychiatry: Denied suicidal or homicidal ideation today.    Data Reviewed: I have personally reviewed following labs and imaging studies  CBC:  Recent Labs Lab 02/14/16 1040 02/15/16 0332 02/16/16 0325  WBC 6.3 8.3 6.2  NEUTROABS 3.4  --  3.9  HGB 15.6 13.8 13.4  HCT 44.7 40.4 39.3  MCV 95.5 96.0 96.1  PLT 149* 142* [id number]*   Basic Metabolic Panel:  Recent Labs Lab 02/14/16 1040 02/14/16 1053 02/15/16 0332 02/16/16 0325  NA 140  --  140 138  K 4.1  --  3.9 3.6  CL 105  --  105 106  CO2 25  --  28 25  GLUCOSE 85  --  105* 108*  BUN 11  --  16 13  CREATININE 0.75  --  0.76 0.84  CALCIUM 9.3  --  9.3 8.8*  MG  --  2.3  --   --    GFR: Estimated Creatinine Clearance: 97.3 mL/min (by C-G formula based on SCr of 0.84 mg/dL). Liver Function Tests:  Recent Labs Lab 02/14/16 1040 02/15/16 0332 02/16/16 0325  AST 191* 125* 112*  ALT 153* 124* 116*  ALKPHOS 137* 126 122  BILITOT 0.7 1.2 1.1  PROT 8.2* 7.2 6.8  ALBUMIN 4.7 4.0 3.6    Recent Labs Lab 02/15/16 1005  LIPASE 36    Recent Labs Lab 02/15/16 1000 02/16/16 0641  AMMONIA 51* 37*   Coagulation Profile:  Recent Labs Lab 02/15/16 0332 02/15/16 1005  INR 0.96 0.93   Cardiac Enzymes:  Recent Labs Lab 02/15/16 1005   CKTOTAL 110   BNP (last 3 results) No results for input(s): PROBNP in the last 8760 hours. HbA1C: No results for input(s): HGBA1C in the last 72 hours. CBG: No results for input(s): GLUCAP in the last 168 hours. Lipid Profile: No results for input(s): CHOL, HDL, LDLCALC, TRIG, CHOLHDL, LDLDIRECT in the last 72 hours. Thyroid Function Tests: No results for input(s): TSH, T4TOTAL, FREET4, T3FREE, THYROIDAB in the last 72 hours. Anemia Panel:  Recent Labs  02/15/16 1000  VITAMINB12 420  FOLATE 46.3   Sepsis Labs: No results for input(s): PROCALCITON, LATICACIDVEN in the last 168 hours.  Recent Results (from the past 240 hour(s))  MRSA PCR Screening     Status: None   Collection Time: 02/14/16 11:40 PM  Result Value Ref Range Status   MRSA by PCR NEGATIVE NEGATIVE Final    Comment:        The GeneXpert MRSA Assay (FDA approved for NASAL specimens only), is one component of a comprehensive MRSA colonization surveillance program. It is not intended to diagnose MRSA infection nor to guide or monitor treatment for MRSA infections.          Radiology Studies: US Abdomen Limited Ruq  Result Date: 02/15/2016 CLINICAL DATA:  Abnormal liver function test EXAM: US ABDOMEN LIMITED - RIGHT UPPER QUADRANT COMPARISON:  None. FINDINGS: Gallbladder: No gallstones or wall thickening visualized. No sonographic Murphy sign noted by sonographer. Common bile duct: Diameter: 4.3 mm in diameter within normal limits Liver: No focal hepatic mass. There is diffuse increased echogenicity of the liver suspicious for fatty infiltration. IMPRESSION: 1. No gallstones are noted within gallbladder.  Normal CBD. 2. No focal hepatic mass. Diffuse increased echogenicity of the liver suspicious for fatty infiltration. Electronically Signed   By: Lahoma Crocker M.D.   On: 02/15/2016 16:49        Scheduled Meds: . enoxaparin (LOVENOX) injection  40 mg Subcutaneous QHS  . nicotine  21 mg Transdermal  Daily   Continuous Infusions:   LOS: 2 days    Ashir Kunz Tanna Furry, MD [facility] Pager [phone]  If 7PM-7AM, please contact night-coverage [email or web address] Password [facility] 02/16/2016, 2:29 PM

## 2016-02-16 NOTE — Care Management Note (Signed)
Case Management Note  Patient Details  Name: Barry Moore MRN: [id number] Date of Birth: October 06, 1965  Subjective/Objective:         etoh withdrawal           Action/Plan:Date:  February 16, 2016 Chart reviewed for concurrent status and case management needs. Will continue to follow patient progress. Discharge Planning: following for needs Expected discharge date: [id number] Velva Harman, BSN, [place], [place]   Expected Discharge Date:                  Expected Discharge Plan:  Homeless Shelter  In-House Referral:  Clinical Social Work  Discharge planning Services  CM Consult  Post Acute Care Choice:    Choice offered to:     DME Arranged:    DME Agency:     HH Arranged:    Interior Agency:     Status of Service:  In process, will continue to follow  If discussed at Long Length of Stay Meetings, dates discussed:    Additional Comments:  Leeroy Cha, RN 02/16/2016, 10:44 AM

## 2016-02-16 NOTE — Progress Notes (Signed)
PT states that he feels fine and is ready to go. Stated he needed to move his things out of his home before they were thrown out and had no one to help with it, and said he would sign a release so he could leave. MD notified.

## 2016-02-16 NOTE — Consult Note (Signed)
[place] [facility] Consult   Reason for Consult:  Alcohol intoxication and depression with SI Referring Physician:  Dr. Carolin Sicks Patient Identification: Barry Moore MRN:  [id number] Principal Diagnosis: Severe alcohol withdrawal without perceptual disturbances without complication [facility]) Diagnosis:   Patient Active Problem List   Diagnosis Date Noted  . Major depressive disorder, recurrent severe without psychotic features ([place]) [F33.2] 02/14/2016  . Alcohol use disorder, severe, dependence ([place]) [F10.20] 02/14/2016  . Severe alcohol withdrawal without perceptual disturbances without complication ([place]) [M62.863] 02/14/2016  . Alcoholic hepatitis without ascites [K70.10] 02/14/2016  . Thrombocytopenia ([place]) [D69.6] 02/14/2016    Total Time spent with patient: 1 hour  Subjective:   Barry Moore is a 50 y.o. male patient admitted with alcohol intoxication and depression.[name]  HPI:  Barry Moore is a 50 y.o. male with a past medical history significant for homelessness, alcohol use disorder severe and depression who presents with suicidality and alcohol withdrawal.  The patient consumes 1 quart of spirits daily, plus beer, has for years (last sober briefly about three months ago).  His last drink was this morning around 9 or 9:30A, after which he called EMS stating he was thinking of hurting himself and he felt bad.  ED course: -Heart rate 87, RR 20,l BP 126/91, pulse ox 96% -Na 140, K 4.1, Cr 0.75, WBC 6.3K, Hgb 15.6 -Alcohol level 371 mg/dL -AST/ALT 191 and 153, respectively -UDS negative -He was evaluated by TTS and accepted to a bed at [facility] for suicidality, when it was noted that his CIWA (within 12 hrs of last drink) was 15.  He was given lorazepam 2 mg PO and repeat CIWA 14.  He was given additional lorazepam 2 mg PO and repeat CIWA was 13 and so TRH were asked to evaluate for alcohol withdrawal  The patient reports a history of alcohol withdrawal seizure but never  delirium or intubation  Past Psychiatric History: Alcohol dependence, alcohol-induced depression and history of acute psychiatric hospitalization in the state of [place].  Risk to Self: Suicidal Ideation: Yes-Currently Present Suicidal Intent: Yes-Currently Present Is patient at risk for suicide?: Yes Suicidal Plan?: Yes-Currently Present Specify Current Suicidal Plan: Cut wrist with knife Access to Means: Yes Specify Access to Suicidal Means: pt has knife What has been your use of drugs/alcohol within the last 12 months?: current use How many times?: 2 Other Self Harm Risks: na Triggers for Past Attempts: Unknown Intentional Self Injurious Behavior: None Risk to Others: Homicidal Ideation: No Thoughts of Harm to Others: No Current Homicidal Intent: No Current Homicidal Plan: No Access to Homicidal Means: No Identified Victim: na History of harm to others?: No Assessment of Violence: None Noted Violent Behavior Description: na Does patient have access to weapons?: No Criminal Charges Pending?: No Does patient have a court date: No Prior Inpatient Therapy: Prior Inpatient Therapy: Yes Prior Therapy Dates:  (pt cannot remember) Prior Therapy Facilty/Provider(s):  (pt does not remember) Reason for Treatment:  (SI attempt) Prior Outpatient Therapy: Prior Outpatient Therapy: No Prior Therapy Dates: na Prior Therapy Facilty/Provider(s): na Reason for Treatment: na Does patient have an ACCT team?: No Does patient have Intensive In-House Services?  : No Does patient have [facility] services? : No Does patient have P4CC services?: No  Past Medical History:  Past Medical History:  Diagnosis Date  . Hypertension    History reviewed. No pertinent surgical history. Family History:  Family History  Problem Relation Age of Onset  . Diabetes Mother   . Emphysema Father   .  Lung cancer Father    Family Psychiatric  History:Noncontributory Social History:  History  Alcohol Use  .  Yes    Comment: [employer]      History  Drug Use No    Social History   Social History  . Marital status: Divorced    Spouse name: N/A  . Number of children: N/A  . Years of education: N/A   Social History Main Topics  . Smoking status: Current Every Day Smoker    Packs/day: 2.00    Types: Cigarettes  . Smokeless tobacco: Never Used  . Alcohol use Yes     Comment: Two Forty's   . Drug use: No  . Sexual activity: Not Asked   Other Topics Concern  . None   Social History Narrative  . None   Additional Social History:    Allergies:   Allergies  Allergen Reactions  . Aspirin Nausea And Vomiting    Labs:  Results for orders placed or performed during the hospital encounter of 02/14/16 (from the past 48 hour(s))  Magnesium     Status: None   Collection Time: 02/14/16 10:53 AM  Result Value Ref Range   Magnesium 2.3 1.7 - 2.4 mg/dL  Rapid urine drug screen (hospital performed)     Status: None   Collection Time: 02/14/16 12:02 PM  Result Value Ref Range   Opiates NONE DETECTED NONE DETECTED   Cocaine NONE DETECTED NONE DETECTED   Benzodiazepines NONE DETECTED NONE DETECTED   Amphetamines NONE DETECTED NONE DETECTED   Tetrahydrocannabinol NONE DETECTED NONE DETECTED   Barbiturates NONE DETECTED NONE DETECTED    Comment:        DRUG SCREEN FOR MEDICAL PURPOSES ONLY.  IF CONFIRMATION IS NEEDED FOR ANY PURPOSE, NOTIFY LAB WITHIN 5 DAYS.        LOWEST DETECTABLE LIMITS FOR URINE DRUG SCREEN Drug Class       Cutoff (ng/mL) Amphetamine      1000 Barbiturate      200 Benzodiazepine   500 Tricyclics       938 Opiates          300 Cocaine          300 THC              50   MRSA PCR Screening     Status: None   Collection Time: 02/14/16 11:40 PM  Result Value Ref Range   MRSA by PCR NEGATIVE NEGATIVE    Comment:        The GeneXpert MRSA Assay (FDA approved for NASAL specimens only), is one component of a comprehensive MRSA colonization surveillance  program. It is not intended to diagnose MRSA infection nor to guide or monitor treatment for MRSA infections.   Comprehensive metabolic panel     Status: Abnormal   Collection Time: 02/15/16  3:32 AM  Result Value Ref Range   Sodium 140 135 - 145 mmol/L   Potassium 3.9 3.5 - 5.1 mmol/L   Chloride 105 101 - 111 mmol/L   CO2 28 22 - 32 mmol/L   Glucose, Bld 105 (H) 65 - 99 mg/dL   BUN 16 6 - 20 mg/dL   Creatinine, Ser 0.76 0.61 - 1.24 mg/dL   Calcium 9.3 8.9 - 10.3 mg/dL   Total Protein 7.2 6.5 - 8.1 g/dL   Albumin 4.0 3.5 - 5.0 g/dL   AST 125 (H) 15 - 41 U/L   ALT 124 (H) 17 - 63 U/L  Alkaline Phosphatase 126 38 - 126 U/L   Total Bilirubin 1.2 0.3 - 1.2 mg/dL   GFR calc non Af Amer >60 >60 mL/min   GFR calc Af Amer >60 >60 mL/min    Comment: (NOTE) The eGFR has been calculated using the CKD EPI equation. This calculation has not been validated in all clinical situations. eGFR's persistently <60 mL/min signify possible Chronic Kidney Disease.    Anion gap 7 5 - 15  CBC     Status: Abnormal   Collection Time: 02/15/16  3:32 AM  Result Value Ref Range   WBC 8.3 4.0 - 10.5 K/uL   RBC 4.21 (L) 4.22 - 5.81 MIL/uL   Hemoglobin 13.8 13.0 - 17.0 g/dL   HCT 40.4 39.0 - 52.0 %   MCV 96.0 78.0 - 100.0 fL   MCH 32.8 26.0 - 34.0 pg   MCHC 34.2 30.0 - 36.0 g/dL   RDW 13.8 11.5 - 15.5 %   Platelets 142 (L) 150 - 400 K/uL  Protime-INR     Status: None   Collection Time: 02/15/16  3:32 AM  Result Value Ref Range   Prothrombin Time 12.8 11.4 - 15.2 seconds   INR 0.96   Ammonia     Status: Abnormal   Collection Time: 02/15/16 10:00 AM  Result Value Ref Range   Ammonia 51 (H) 9 - 35 umol/L  Vitamin B12     Status: None   Collection Time: 02/15/16 10:00 AM  Result Value Ref Range   Vitamin B-12 420 180 - 914 pg/mL    Comment: (NOTE) This assay is not validated for testing neonatal or myeloproliferative syndrome specimens for Vitamin B12 levels. Performed at [facility]    Folate     Status: None   Collection Time: 02/15/16 10:00 AM  Result Value Ref Range   Folate 46.3 >5.9 ng/mL    Comment: Performed at [facility]  CK     Status: None   Collection Time: 02/15/16 10:05 AM  Result Value Ref Range   Total CK 110 49 - 397 U/L  Lipase, blood     Status: None   Collection Time: 02/15/16 10:05 AM  Result Value Ref Range   Lipase 36 11 - 51 U/L  Protime-INR     Status: None   Collection Time: 02/15/16 10:05 AM  Result Value Ref Range   Prothrombin Time 12.5 11.4 - 15.2 seconds   INR 0.93   CBC with Differential/Platelet     Status: Abnormal   Collection Time: 02/16/16  3:25 AM  Result Value Ref Range   WBC 6.2 4.0 - 10.5 K/uL   RBC 4.09 (L) 4.22 - 5.81 MIL/uL   Hemoglobin 13.4 13.0 - 17.0 g/dL   HCT 39.3 39.0 - 52.0 %   MCV 96.1 78.0 - 100.0 fL   MCH 32.8 26.0 - 34.0 pg   MCHC 34.1 30.0 - 36.0 g/dL   RDW 13.7 11.5 - 15.5 %   Platelets 134 (L) 150 - 400 K/uL   Neutrophils Relative % 63 %   Neutro Abs 3.9 1.7 - 7.7 K/uL   Lymphocytes Relative 20 %   Lymphs Abs 1.3 0.7 - 4.0 K/uL   Monocytes Relative 15 %   Monocytes Absolute 0.9 0.1 - 1.0 K/uL   Eosinophils Relative 2 %   Eosinophils Absolute 0.2 0.0 - 0.7 K/uL   Basophils Relative 0 %   Basophils Absolute 0.0 0.0 - 0.1 K/uL  Comprehensive metabolic panel  Status: Abnormal   Collection Time: 02/16/16  3:25 AM  Result Value Ref Range   Sodium 138 135 - 145 mmol/L   Potassium 3.6 3.5 - 5.1 mmol/L   Chloride 106 101 - 111 mmol/L   CO2 25 22 - 32 mmol/L   Glucose, Bld 108 (H) 65 - 99 mg/dL   BUN 13 6 - 20 mg/dL   Creatinine, Ser 0.84 0.61 - 1.24 mg/dL   Calcium 8.8 (L) 8.9 - 10.3 mg/dL   Total Protein 6.8 6.5 - 8.1 g/dL   Albumin 3.6 3.5 - 5.0 g/dL   AST 112 (H) 15 - 41 U/L   ALT 116 (H) 17 - 63 U/L   Alkaline Phosphatase 122 38 - 126 U/L   Total Bilirubin 1.1 0.3 - 1.2 mg/dL   GFR calc non Af Amer >60 >60 mL/min   GFR calc Af Amer >60 >60 mL/min    Comment: (NOTE) The eGFR  has been calculated using the CKD EPI equation. This calculation has not been validated in all clinical situations. eGFR's persistently <60 mL/min signify possible Chronic Kidney Disease.    Anion gap 7 5 - 15  Ammonia     Status: Abnormal   Collection Time: 02/16/16  6:41 AM  Result Value Ref Range   Ammonia 37 (H) 9 - 35 umol/L    Current Facility-Administered Medications  Medication Dose Route Frequency Provider Last Rate Last Dose  . alum & mag hydroxide-simeth (MAALOX/MYLANTA) 200-200-20 MG/5ML suspension 30 mL  30 mL Oral PRN Lacretia Leigh, MD      . enoxaparin (LOVENOX) injection 40 mg  40 mg Subcutaneous QHS Edwin Dada, MD   40 mg at 02/15/16 2135  . hydrALAZINE (APRESOLINE) injection 10 mg  10 mg Intravenous Q6H PRN Florencia Reasons, MD      . LORazepam (ATIVAN) injection 2-3 mg  2-3 mg Intravenous Q1H PRN Edwin Dada, MD   2 mg at 02/16/16 0930  . nicotine (NICODERM CQ - dosed in mg/24 hours) patch 21 mg  21 mg Transdermal Daily Lacretia Leigh, MD   21 mg at 02/16/16 0923  . ondansetron (ZOFRAN) tablet 4 mg  4 mg Oral Q6H PRN Edwin Dada, MD       Or  . ondansetron (ZOFRAN) injection 4 mg  4 mg Intravenous Q6H PRN Edwin Dada, MD   4 mg at 02/14/16 2359    Musculoskeletal: Strength & Muscle Tone: decreased Gait & Station: unable to stand Patient leans: N/A  Psychiatric Specialty Exam: Physical Exam as per history and physical   ROS patient has been depressed, anxious and having suicidal thoughts and also alcohol withdrawal symptoms including hand tremors. No Fever-chills, No Headache, No changes with Vision or hearing, reports vertigo No problems swallowing food or Liquids, No Chest pain, Cough or Shortness of Breath, No Abdominal pain, No Nausea or Vommitting, Bowel movements are regular, No Blood in stool or Urine, No dysuria, No new skin rashes or bruises, No new joints pains-aches,  No new weakness, tingling, numbness in any  extremity, No recent weight gain or loss, No polyuria, polydypsia or polyphagia,   A full 10 point Review of Systems was done, except as stated above, all other Review of Systems were negative.  Blood pressure (!) 158/96, pulse 80, temperature 98.2 F (36.8 C), resp. rate 18, height 5' 7"  (1.702 m), weight 75.5 kg (166 lb 7.2 oz), SpO2 99 %.Body mass index is 26.07 kg/m.  General Appearance: Disheveled and Guarded  Eye Contact:  Good  Speech:  Clear and Coherent  Volume:  Decreased  Mood:  Anxious and Depressed  Affect:  Constricted and Depressed  Thought Process:  Coherent and Goal Directed  Orientation:  Full (Time, Place, and Person)  Thought Content:  WDL  Suicidal Thoughts:  Yes.  without intent/plan  Homicidal Thoughts:  No  Memory:  Immediate;   Good Recent;   Fair Remote;   Fair  Judgement:  Impaired  Insight:  Fair  Psychomotor Activity:  Restlessness  Concentration:  Concentration: Fair and Attention Span: Fair  Recall:  Good  Fund of Knowledge:  Good  Language:  Good  Akathisia:  Negative  Handed:  Right  AIMS (if indicated):     Assets:  Communication Skills Desire for Improvement Leisure Time Resilience  ADL's:  Intact  Cognition:  WNL  Sleep:        Treatment Plan Summary: 50 years old male admitted with alcohol intoxication and history of alcohol dependence with the alcohol withdrawal seizures. Patient has been under significant psychosocial stresses seems to be homeless, broke up with his relationship and very limited psychosocial support in [place]. Patient minimizes his alcohol withdrawals symptoms, depression and suicidal thoughts/ideation, intention or plans. Patient has no homicidal ideation. Patient has no evidence of psychosis.  Patient is dangerous to leave the hospital without proper treatment for alcohol withdrawal symptoms  Recommended Ativan detox protocol and CIWA monitoring Patient may need involuntary commitment if he refuses treatment  and trying to leave the hospital Referred to the unit social service for alcohol inpatient treatment centers and medically stable like [facility] or [facility]. Patient is currently not willing to participate in [facility] treatment center and prefers chemical dependency intensive outpatient treatment in the does not have appropriate resources for transportation etc.  Appreciate psychiatric consultation and we sign off as of today Please contact [phone] or [phone] if needs further assistance   Daily contact with patient to assess and evaluate symptoms and progress in treatment and Medication management  Disposition:  No evidence of imminent risk to self or others at present.   Patient does not meet criteria for psychiatric inpatient admission. Supportive therapy provided about ongoing stressors. Refer to IOP. or residential treatment Center for chemical dependency   Ambrose Finland, MD 02/16/2016 10:41 AM

## 2016-02-17 DIAGNOSIS — F10239 Alcohol dependence with withdrawal, unspecified: Secondary | ICD-10-CM | POA: Diagnosis not present

## 2016-02-17 DIAGNOSIS — Z833 Family history of diabetes mellitus: Secondary | ICD-10-CM | POA: Diagnosis not present

## 2016-02-17 DIAGNOSIS — F1094 Alcohol use, unspecified with alcohol-induced mood disorder: Secondary | ICD-10-CM | POA: Diagnosis not present

## 2016-02-17 DIAGNOSIS — F332 Major depressive disorder, recurrent severe without psychotic features: Secondary | ICD-10-CM | POA: Diagnosis not present

## 2016-02-17 LAB — CERULOPLASMIN: Ceruloplasmin: 19.3 mg/dL (ref 16.0–31.0)

## 2016-02-17 MED ORDER — VITAMIN B-1 100 MG PO TABS
100.0000 mg | ORAL_TABLET | Freq: Every day | ORAL | Status: DC
  Administered 2016-02-17 – 2016-02-19 (×3): 100 mg via ORAL
  Filled 2016-02-17 (×3): qty 1

## 2016-02-17 MED ORDER — FOLIC ACID 1 MG PO TABS
1.0000 mg | ORAL_TABLET | Freq: Every day | ORAL | Status: DC
  Administered 2016-02-17 – 2016-02-19 (×3): 1 mg via ORAL
  Filled 2016-02-17 (×3): qty 1

## 2016-02-17 NOTE — Progress Notes (Addendum)
PROGRESS NOTE    Barry Moore  [id number] DOB: 1965-08-06 DOA: 02/14/2016 PCP: No PCP Per Patient   Brief Narrative: 50 y.o. male with a past medical history significant for homelessness, alcohol use disorder severe and depression who presents with suicidality and alcohol withdrawal.  Assessment & Plan:   #  Severe alcohol withdrawal: Patient still has upper extremities  Tremor. He has tachycardia and elevated BP consistent with withdrawal. Continue CIWA protocol. Continue vitamins, Ativan when necessary. -Education provided to the patient -Consult SW for safe discharge plan. Patient is homeless.  #  Major depressive disorder, recurrent severe without psychotic features ([place]) and suicidal ideation:  -Continue bed sitter for suicidal ideation. Discussed with Dr. Louretta Shorten from psychiatry. He recommended continue safety sitter, treat withdrawal and  pt is unable to leave AMA. Consult appreciated. -Patient denied suicidal or homicidal ideation this morning. He wants to go home  #  Alcohol use disorder, severe, dependence [facility]): Monitoring for withdrawal. Patient was educated.on CIWA protocol. Rehab inpatient vs outpatient.    # Fatty liver and elevated liver enzymes due to alcohol abuse: Hepatitis B and C negative. HIV nonreactive. Liver enzymes trending down.  # Thrombocytopenia ([place]): Due to liver disease. Stable platelet. No sign of bleeding.  DVT prophylaxis: Lovenox subcutaneous Code Status: Full code Family Communication: No family present at bedside Disposition Plan: Likely discharge in 1-2 days.     Consultants:   psychiatry  Procedures: None Antimicrobials: None  Subjective: Patient was seen and examined at bedside. Patient wanted to go home today. Denied headache, dizziness, nausea, vomiting, chest pain or shortness of breath patient was minimizing symptoms. Upper extremity tremor was visible. Objective: Vitals:   02/17/16 1200 02/17/16 1300 02/17/16 1400  02/17/16 1557  BP: (!) 142/79 (!) 150/80 (!) 138/95   Pulse: 93 (!) 107 96   Resp: (!) 22 (!) 21 (!) 22   Temp:    98.2 F (36.8 C)  TempSrc:    Oral  SpO2: 97% 99% 99%   Weight:      Height:        Intake/Output Summary (Last 24 hours) at 02/17/16 1558 Last data filed at 02/17/16 1437  Gross per 24 hour  Intake             2040 ml  Output                0 ml  Net             2040 ml   Filed Weights   02/14/16 2340  Weight: 75.5 kg (166 lb 7.2 oz)    Examination:  General exam: Not in distress, Respiratory system: Clear to auscultation. Respiratory effort normal. No wheezing or crackle Cardiovascular system: S1 & S2 heard, tachycardic.  No pedal edema. Gastrointestinal system: Abdomen is nondistended, soft and nontender. Normal bowel sounds heard. Central nervous system: Alert. Awake and following commands. Extremities: Symmetric 5 x 5 power. Upper extremities tremor Skin: No rashes, lesions or ulcers Psychiatry: Continue to deny suicidal or homicidal ideation     Data Reviewed: I have personally reviewed following labs and imaging studies  CBC:  Recent Labs Lab 02/14/16 1040 02/15/16 0332 02/16/16 0325  WBC 6.3 8.3 6.2  NEUTROABS 3.4  --  3.9  HGB 15.6 13.8 13.4  HCT 44.7 40.4 39.3  MCV 95.5 96.0 96.1  PLT 149* 142* [id number]*   Basic Metabolic Panel:  Recent Labs Lab 02/14/16 1040 02/14/16 1053 02/15/16 0332 02/16/16 0325  NA 140  --  140 138  K 4.1  --  3.9 3.6  CL 105  --  105 106  CO2 25  --  28 25  GLUCOSE 85  --  105* 108*  BUN 11  --  16 13  CREATININE 0.75  --  0.76 0.84  CALCIUM 9.3  --  9.3 8.8*  MG  --  2.3  --   --    GFR: Estimated Creatinine Clearance: 97.3 mL/min (by C-G formula based on SCr of 0.84 mg/dL). Liver Function Tests:  Recent Labs Lab 02/14/16 1040 02/15/16 0332 02/16/16 0325  AST 191* 125* 112*  ALT 153* 124* 116*  ALKPHOS 137* 126 122  BILITOT 0.7 1.2 1.1  PROT 8.2* 7.2 6.8  ALBUMIN 4.7 4.0 3.6    Recent  Labs Lab 02/15/16 1005  LIPASE 36    Recent Labs Lab 02/15/16 1000 02/16/16 0641  AMMONIA 51* 37*   Coagulation Profile:  Recent Labs Lab 02/15/16 0332 02/15/16 1005  INR 0.96 0.93   Cardiac Enzymes:  Recent Labs Lab 02/15/16 1005  CKTOTAL 110   BNP (last 3 results) No results for input(s): PROBNP in the last 8760 hours. HbA1C: No results for input(s): HGBA1C in the last 72 hours. CBG: No results for input(s): GLUCAP in the last 168 hours. Lipid Profile: No results for input(s): CHOL, HDL, LDLCALC, TRIG, CHOLHDL, LDLDIRECT in the last 72 hours. Thyroid Function Tests: No results for input(s): TSH, T4TOTAL, FREET4, T3FREE, THYROIDAB in the last 72 hours. Anemia Panel:  Recent Labs  02/15/16 1000  VITAMINB12 420  FOLATE 46.3   Sepsis Labs: No results for input(s): PROCALCITON, LATICACIDVEN in the last 168 hours.  Recent Results (from the past 240 hour(s))  MRSA PCR Screening     Status: None   Collection Time: 02/14/16 11:40 PM  Result Value Ref Range Status   MRSA by PCR NEGATIVE NEGATIVE Final    Comment:        The GeneXpert MRSA Assay (FDA approved for NASAL specimens only), is one component of a comprehensive MRSA colonization surveillance program. It is not intended to diagnose MRSA infection nor to guide or monitor treatment for MRSA infections.          Radiology Studies: No results found.      Scheduled Meds: . enoxaparin (LOVENOX) injection  40 mg Subcutaneous QHS  . folic acid  1 mg Oral Daily  . nicotine  21 mg Transdermal Daily  . thiamine  100 mg Oral Daily   Continuous Infusions:   LOS: 3 days    Clydene Burack Tanna Furry, MD [facility] Pager [phone]  If 7PM-7AM, please contact night-coverage [email or web address] Password [facility] 02/17/2016, 3:58 PM

## 2016-02-17 NOTE — Consult Note (Signed)
[place] Psychiatry Consult   Reason for Consult:  Alcohol intoxication and depression with SI Referring Physician:  Dr. Carolin Sicks Patient Identification: Barry Moore MRN:  [id number] Principal Diagnosis: Severe alcohol withdrawal without perceptual disturbances without complication [facility]) Diagnosis:   Patient Active Problem List   Diagnosis Date Noted  . Major depressive disorder, recurrent severe without psychotic features ([place]) [F33.2] 02/14/2016  . Alcohol use disorder, severe, dependence ([place]) [F10.20] 02/14/2016  . Severe alcohol withdrawal without perceptual disturbances without complication ([place]) [F64.332] 02/14/2016  . Alcoholic hepatitis without ascites [K70.10] 02/14/2016  . Thrombocytopenia ([place]) [D69.6] 02/14/2016    Total Time spent with patient: 1 hour  Subjective:   Barry Moore is a 50 y.o. male patient admitted with alcohol intoxication and depression.[name]  HPI:  [name] is a 50 y.o. male with a past medical history significant for homelessness, alcohol use disorder severe and depression who presents with suicidality and alcohol withdrawal.  The patient consumes 1 quart of spirits daily, plus beer, has for years (last sober briefly about three months ago).  His last drink was this morning around 9 or 9:30A, after which he called EMS stating he was thinking of hurting himself and he felt bad.  ED course: -Heart rate 87, RR 20,l BP 126/91, pulse ox 96% -Na 140, K 4.1, Cr 0.75, WBC 6.3K, Hgb 15.6 -Alcohol level 371 mg/dL -AST/ALT 191 and 153, respectively -UDS negative -He was evaluated by TTS and accepted to a bed at [facility] for suicidality, when it was noted that his CIWA (within 12 hrs of last drink) was 15.  He was given lorazepam 2 mg PO and repeat CIWA 14.  He was given additional lorazepam 2 mg PO and repeat CIWA was 13 and so TRH were asked to evaluate for alcohol withdrawal  The patient reports a history of alcohol withdrawal seizure but never  delirium or intubation  Past Psychiatric History: Alcohol dependence, alcohol-induced depression and history of acute psychiatric hospitalization in the state of [place].  Interval history: Patient seen face-to-face for psychiatric consultation follow-up today along with psychiatric LCSW. Patient continued to have mild hand tremors able to eat and drink without stomach upset. Patient reportedly started feeding better today than yesterday. Patient minimizes the possibility of delirium tremens and also withdrawal seizures. Patient is highly focused our ruminated about going back to the woods and correcting his personal belongings. Patient denies current symptoms of depression, suicidal/homicidal ideation, intention or plans.  Risk to Self: Suicidal Ideation: NO Suicidal Intent: No Is patient at risk for suicide?: NO Suicidal Plan?: NO Specify Current Suicidal Plan: None Access to Means:  Specify Access to Suicidal Means:  What has been your use of drugs/alcohol within the last 12 months?: current use How many times?: 2 Other Self Harm Risks: na Triggers for Past Attempts: Unknown Intentional Self Injurious Behavior: None Risk to Others: Homicidal Ideation: No Thoughts of Harm to Others: No Current Homicidal Intent: No Current Homicidal Plan: No Access to Homicidal Means: No Identified Victim: na History of harm to others?: No Assessment of Violence: None Noted Violent Behavior Description: na Does patient have access to weapons?: No Criminal Charges Pending?: No Does patient have a court date: No Prior Inpatient Therapy: Prior Inpatient Therapy: Yes Prior Therapy Dates:  (pt cannot remember) Prior Therapy Facilty/Provider(s):  (pt does not remember) Reason for Treatment:  (SI attempt) Prior Outpatient Therapy: Prior Outpatient Therapy: No Prior Therapy Dates: na Prior Therapy Facilty/Provider(s): na Reason for Treatment: na Does patient have an ACCT  team?: No Does patient have  Intensive In-House Services?  : No Does patient have [facility] services? : No Does patient have P4CC services?: No  Past Medical History:  Past Medical History:  Diagnosis Date  . Hypertension    History reviewed. No pertinent surgical history. Family History:  Family History  Problem Relation Age of Onset  . Diabetes Mother   . Emphysema Father   . Lung cancer Father    Family Psychiatric  History:Noncontributory Social History:  History  Alcohol Use  . Yes    Comment: Two Forty's      History  Drug Use No    Social History   Social History  . Marital status: Divorced    Spouse name: N/A  . Number of children: N/A  . Years of education: N/A   Social History Main Topics  . Smoking status: Current Every Day Smoker    Packs/day: 2.00    Types: Cigarettes  . Smokeless tobacco: Never Used  . Alcohol use Yes     Comment: Two Forty's   . Drug use: No  . Sexual activity: Not Asked   Other Topics Concern  . None   Social History Narrative  . None   Additional Social History:    Allergies:   Allergies  Allergen Reactions  . Aspirin Nausea And Vomiting    Labs:  Results for orders placed or performed during the hospital encounter of 02/14/16 (from the past 48 hour(s))  CBC with Differential/Platelet     Status: Abnormal   Collection Time: 02/16/16  3:25 AM  Result Value Ref Range   WBC 6.2 4.0 - 10.5 K/uL   RBC 4.09 (L) 4.22 - 5.81 MIL/uL   Hemoglobin 13.4 13.0 - 17.0 g/dL   HCT 39.3 39.0 - 52.0 %   MCV 96.1 78.0 - 100.0 fL   MCH 32.8 26.0 - 34.0 pg   MCHC 34.1 30.0 - 36.0 g/dL   RDW 13.7 11.5 - 15.5 %   Platelets 134 (L) 150 - 400 K/uL   Neutrophils Relative % 63 %   Neutro Abs 3.9 1.7 - 7.7 K/uL   Lymphocytes Relative 20 %   Lymphs Abs 1.3 0.7 - 4.0 K/uL   Monocytes Relative 15 %   Monocytes Absolute 0.9 0.1 - 1.0 K/uL   Eosinophils Relative 2 %   Eosinophils Absolute 0.2 0.0 - 0.7 K/uL   Basophils Relative 0 %   Basophils Absolute 0.0 0.0 - 0.1  K/uL  Comprehensive metabolic panel     Status: Abnormal   Collection Time: 02/16/16  3:25 AM  Result Value Ref Range   Sodium 138 135 - 145 mmol/L   Potassium 3.6 3.5 - 5.1 mmol/L   Chloride 106 101 - 111 mmol/L   CO2 25 22 - 32 mmol/L   Glucose, Bld 108 (H) 65 - 99 mg/dL   BUN 13 6 - 20 mg/dL   Creatinine, Ser 0.84 0.61 - 1.24 mg/dL   Calcium 8.8 (L) 8.9 - 10.3 mg/dL   Total Protein 6.8 6.5 - 8.1 g/dL   Albumin 3.6 3.5 - 5.0 g/dL   AST 112 (H) 15 - 41 U/L   ALT 116 (H) 17 - 63 U/L   Alkaline Phosphatase 122 38 - 126 U/L   Total Bilirubin 1.1 0.3 - 1.2 mg/dL   GFR calc non Af Amer >60 >60 mL/min   GFR calc Af Amer >60 >60 mL/min    Comment: (NOTE) The eGFR has been calculated using  the CKD EPI equation. This calculation has not been validated in all clinical situations. eGFR's persistently <60 mL/min signify possible Chronic Kidney Disease.    Anion gap 7 5 - 15  Ceruloplasmin     Status: None   Collection Time: 02/16/16  3:25 AM  Result Value Ref Range   Ceruloplasmin 19.3 16.0 - 31.0 mg/dL    Comment: (NOTE) Performed At: [facility] [place], [place] [id number] Lindon Romp MD GO:[id number]   Ammonia     Status: Abnormal   Collection Time: 02/16/16  6:41 AM  Result Value Ref Range   Ammonia 37 (H) 9 - 35 umol/L    Current Facility-Administered Medications  Medication Dose Route Frequency Provider Last Rate Last Dose  . alum & mag hydroxide-simeth (MAALOX/MYLANTA) 200-200-20 MG/5ML suspension 30 mL  30 mL Oral PRN Lacretia Leigh, MD      . enoxaparin (LOVENOX) injection 40 mg  40 mg Subcutaneous QHS Edwin Dada, MD   40 mg at 02/16/16 2243  . folic acid (FOLVITE) tablet 1 mg  1 mg Oral Daily Dron Tanna Furry, MD   1 mg at 02/17/16 0900  . hydrALAZINE (APRESOLINE) injection 10 mg  10 mg Intravenous Q6H PRN Florencia Reasons, MD   10 mg at 02/17/16 1141  . LORazepam (ATIVAN) injection 2-3 mg  2-3 mg Intravenous Q1H PRN Edwin Dada, MD   2 mg at 02/17/16 1336  . nicotine (NICODERM CQ - dosed in mg/24 hours) patch 21 mg  21 mg Transdermal Daily Lacretia Leigh, MD   21 mg at 02/17/16 1000  . ondansetron (ZOFRAN) tablet 4 mg  4 mg Oral Q6H PRN Edwin Dada, MD       Or  . ondansetron (ZOFRAN) injection 4 mg  4 mg Intravenous Q6H PRN Edwin Dada, MD   4 mg at 02/14/16 2359  . thiamine (VITAMIN B-1) tablet 100 mg  100 mg Oral Daily Dron Tanna Furry, MD   100 mg at 02/17/16 0900    Musculoskeletal: Strength & Muscle Tone: decreased Gait & Station: unable to stand Patient leans: N/A  Psychiatric Specialty Exam: Physical Exam  as per history and physical   ROS  patient has been depressed, anxious and having suicidal thoughts and also alcohol withdrawal symptoms including hand tremors. No Fever-chills, No Headache, No changes with Vision or hearing, reports vertigo No problems swallowing food or Liquids, No Chest pain, Cough or Shortness of Breath, No Abdominal pain, No Nausea or Vommitting, Bowel movements are regular, No Blood in stool or Urine, No dysuria, No new skin rashes or bruises, No new joints pains-aches,  No new weakness, tingling, numbness in any extremity, No recent weight gain or loss, No polyuria, polydypsia or polyphagia,   A full 10 point Review of Systems was done, except as stated above, all other Review of Systems were negative.  Blood pressure (!) 138/95, pulse 96, temperature 98.2 F (36.8 C), temperature source Oral, resp. rate (!) 22, height 5' 7" (1.702 m), weight 75.5 kg (166 lb 7.2 oz), SpO2 99 %.Body mass index is 26.07 kg/m.  General Appearance: Disheveled and Guarded  Eye Contact:  Good  Speech:  Clear and Coherent  Volume:  Decreased  Mood:  Anxious and Depressed  Affect:  Constricted and Depressed  Thought Process:  Coherent and Goal Directed  Orientation:  Full (Time, Place, and Person)  Thought Content:  WDL  Suicidal Thoughts:  Yes.  without  intent/plan  Homicidal  Thoughts:  No  Memory:  Immediate;   Good Recent;   Fair Remote;   Fair  Judgement:  Impaired  Insight:  Fair  Psychomotor Activity:  Restlessness  Concentration:  Concentration: Fair and Attention Span: Fair  Recall:  Good  Fund of Knowledge:  Good  Language:  Good  Akathisia:  Negative  Handed:  Right  AIMS (if indicated):     Assets:  Communication Skills Desire for Improvement Leisure Time Resilience  ADL's:  Intact  Cognition:  WNL  Sleep:        Treatment Plan Summary: 50 years old male admitted with alcohol intoxication and history of alcohol dependence with the alcohol withdrawal seizures. Patient has been under significant psychosocial stresses seems to be homeless, broke up with his relationship and very limited psychosocial support in [place]. Patient Denied alcohol withdrawals delirium, psychosis, withdrawal seizures, depression and suicidal thoughts/ideation, intention or plans. Patient has no homicidal ideation. Patient has no evidence of psychosis.  Case discussed with LCSW who followed with mean during this rounds  Patient minimizes his symptoms of withdrawal and saying he has been out of danger for the delirium tremens since withdrawal seizures. 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 Continue Ativan detox protocol and CIWA monitoring Patient may need involuntary commitment if he refuses treatment and trying to leave the hospital Referred to alcohol inpatient treatment when medically stable like [facility] or [facility] patient stated his priority is going back to the woods and correcting his personal belongings and then may think about going for the rehabilitation treatment.   Appreciate psychiatric consultation and we sign off as of today Please contact [phone] or [phone] if needs further assistance  Disposition: No evidence of imminent risk to self or others at present.   Supportive therapy  provided about ongoing stressors. Refer to IOP. or residential treatment Center for chemical dependency   Ambrose Finland, MD 02/17/2016 3:37 PM

## 2016-02-18 MED ORDER — LABETALOL HCL 100 MG PO TABS
100.0000 mg | ORAL_TABLET | Freq: Two times a day (BID) | ORAL | Status: DC
  Administered 2016-02-18 – 2016-02-19 (×3): 100 mg via ORAL
  Filled 2016-02-18 (×3): qty 1

## 2016-02-18 MED ORDER — TRAMADOL HCL 50 MG PO TABS
50.0000 mg | ORAL_TABLET | Freq: Once | ORAL | Status: AC | PRN
  Administered 2016-02-18: 50 mg via ORAL
  Filled 2016-02-18: qty 1

## 2016-02-18 NOTE — Progress Notes (Signed)
Patient ID: Barry Moore, male   DOB: 1965-09-05, 50 y.o.   MRN: [id number]    PROGRESS NOTE    Yosniel Spragg  [id number] DOB: February 17, 1966 DOA: 02/14/2016  PCP: case manager consult for assistance with setting up PCP  Brief Narrative:   50 y.o.male, homeless, with known alcohol use disorder, severe depressionwho presented with suicidality and alcohol withdrawal.  Assessment & Plan: Severe alcohol withdrawal - still with upper extremity termor and tachycardia, elevated BP consistent with withdrawal  - keep on CIWA protocol for now but OK to transfer to tele unit - continue MVI, thiamine, folate - pt tolerating diet well so far  - Consult SW for safe discharge plan. Patient is homeless  Major depressive disorder, recurrent severe without psychotic features ([place]), with suicidal ideation - psych recommends to continue Air cabin crew, treat withdrawal - pt is unable to leave AMA until treated appropriately, if tries to leave, needs involuntary commitment paper signed  - pt currently denies SI  Hypertension, essential - from alcohol withdrawal - added low dose Labetalol scheduled   Alcohol use disorder, severe, dependence ([place]) - will need rehab inpatient vs outpatient.    Fatty liver and elevated liver enzymes  - due to alcohol abuse - LFT's trending down   Thrombocytopenia (HCC) - due to liver disease in the setting of alcohol abuse - no signs of bleeding - CBC in AM  DVT prophylaxis: Lovenox SQ Code Status: Full  Family Communication: Patient at bedside  Disposition Plan: Home vs inpatient alcohol treatment program in 1-2 days, OK to transfer to tele today   Consultants:   Psych   Procedures:   None  Antimicrobials:   None  Subjective: No events overnight.   Objective: Vitals:   02/17/16 2000 02/18/16 0000 02/18/16 0400 02/18/16 0725  BP: (!) 146/95 (!) 157/99 (!) 160/107 (!) 171/100  Pulse: 95 87 79 87  Resp: (!) 23 (!) 34 19 14  Temp:   97.6 F  (36.4 C)   TempSrc:   Oral   SpO2:  97% 96% 96%  Weight:      Height:        Intake/Output Summary (Last 24 hours) at 02/18/16 0847 Last data filed at 02/18/16 0838  Gross per 24 hour  Intake             2160 ml  Output                0 ml  Net             2160 ml   Filed Weights   02/14/16 2340  Weight: 75.5 kg (166 lb 7.2 oz)    Examination:  General exam: Appears calm and comfortable  Respiratory system: Clear to auscultation. Respiratory effort normal. Cardiovascular system: S1 & S2 heard, RRR. No JVD, murmurs, rubs, gallops or clicks. No pedal edema. Gastrointestinal system: Abdomen is nondistended, soft and nontender. No organomegaly or masses felt. Normal bowel sounds heard. Central nervous system: Alert and oriented. Upper extremity tremor still present.  Extremities: Symmetric 5 x 5 power. Skin: No rashes, lesions or ulcers Psychiatry: Judgement and insight appear normal. Mood & affect appropriate.    Data Reviewed: I have personally reviewed following labs and imaging studies  CBC:  Recent Labs Lab 02/14/16 1040 02/15/16 0332 02/16/16 0325  WBC 6.3 8.3 6.2  NEUTROABS 3.4  --  3.9  HGB 15.6 13.8 13.4  HCT 44.7 40.4 39.3  MCV 95.5 96.0 96.1  PLT 149* 142* 134*  Basic Metabolic Panel:  Recent Labs Lab 02/14/16 1040 02/14/16 1053 02/15/16 0332 02/16/16 0325  NA 140  --  140 138  K 4.1  --  3.9 3.6  CL 105  --  105 106  CO2 25  --  28 25  GLUCOSE 85  --  105* 108*  BUN 11  --  16 13  CREATININE 0.75  --  0.76 0.84  CALCIUM 9.3  --  9.3 8.8*  MG  --  2.3  --   --    Liver Function Tests:  Recent Labs Lab 02/14/16 1040 02/15/16 0332 02/16/16 0325  AST 191* 125* 112*  ALT 153* 124* 116*  ALKPHOS 137* 126 122  BILITOT 0.7 1.2 1.1  PROT 8.2* 7.2 6.8  ALBUMIN 4.7 4.0 3.6    Recent Labs Lab 02/15/16 1005  LIPASE 36    Recent Labs Lab 02/15/16 1000 02/16/16 0641  AMMONIA 51* 37*   Coagulation Profile:  Recent Labs Lab  02/15/16 0332 02/15/16 1005  INR 0.96 0.93   Cardiac Enzymes:  Recent Labs Lab 02/15/16 1005  CKTOTAL 110   Anemia Panel:  Recent Labs  02/15/16 1000  VITAMINB12 79  FOLATE 46.3   Recent Results (from the past 240 hour(s))  MRSA PCR Screening     Status: None   Collection Time: 02/14/16 11:40 PM  Result Value Ref Range Status   MRSA by PCR NEGATIVE NEGATIVE Final    Comment:        The GeneXpert MRSA Assay (FDA approved for NASAL specimens only), is one component of a comprehensive MRSA colonization surveillance program. It is not intended to diagnose MRSA infection nor to guide or monitor treatment for MRSA infections.     Radiology Studies: No results found.    Scheduled Meds: . enoxaparin (LOVENOX) injection  40 mg Subcutaneous QHS  . folic acid  1 mg Oral Daily  . nicotine  21 mg Transdermal Daily  . thiamine  100 mg Oral Daily   Continuous Infusions:   LOS: 4 days   Time spent: 20 minutes   Faye Ramsay, MD [facility] Pager [phone]  If 7PM-7AM, please contact night-coverage [email or web address] Password [email or web address] [facility] 02/18/2016, 8:47 AM

## 2016-02-19 DIAGNOSIS — F332 Major depressive disorder, recurrent severe without psychotic features: Secondary | ICD-10-CM

## 2016-02-19 DIAGNOSIS — F1020 Alcohol dependence, uncomplicated: Secondary | ICD-10-CM

## 2016-02-19 DIAGNOSIS — F10230 Alcohol dependence with withdrawal, uncomplicated: Secondary | ICD-10-CM

## 2016-02-19 LAB — COPPER, SERUM: Copper: 91 ug/dL (ref 72–166)

## 2016-02-19 MED ORDER — LABETALOL HCL 100 MG PO TABS: 100.0000 mg | ORAL_TABLET | Freq: Two times a day (BID) | ORAL | 0 refills | Status: DC

## 2016-02-19 MED ORDER — FOLIC ACID 1 MG PO TABS
1.0000 mg | ORAL_TABLET | Freq: Every day | ORAL | 0 refills | Status: DC
Start: 2016-02-20 — End: 2016-03-09

## 2016-02-19 MED ORDER — THIAMINE HCL 100 MG PO TABS: 100.0000 mg | ORAL_TABLET | Freq: Every day | ORAL | 0 refills | Status: DC

## 2016-02-19 NOTE — Clinical Social Work Psych Assess (Addendum)
Clinical Social Work Nature conservation officer  Clinical Social Worker:  Lia Hopping, LCSW Date/Time:  02/19/2016, 11:03 AM Referred By:  Clinical Social Work Date Referred:  02/19/16 Reason for Referral:  [place]   Presenting Symptoms/Problems  Presenting Symptoms/Problems(in person's/family's own words):  "I just moved here from, [place] I live in a tent community with other residents. I was drinking with friends feeling sad and started drinking a lot. " Presented to ED for suicidal ideations and depression   Abuse/Neglect/Trauma History  Abuse/Neglect/Trauma History:  Denies History Abuse/Neglect/Trauma History Comments (indicate dates):  Did not go into detail about relationship with family.    Psychiatric History  Psychiatric History:  Denies History Psychiatric Medication:     Current Mental Health Hospitalizations/Previous Mental Health History:  Reports No hx   Current Provider:  No PCP Place and Date:  n/a Current Medications:  for [place], [place] as of 02/19/16 1136   Legend:                    Inactive   Active   Linked          Medications 02/19/16 02/20/16 02/21/16 02/22/16 02/23/16 02/24/16 02/25/16  enoxaparin (LOVENOX) injection 40 mg Dose: 40 mg Freq: Daily at bedtime Route: [place] Start: 02/14/16 2345   Admin Instructions:  Pharmacy may adjust. Do NOT expel air bubble from syringe before giving.   2200    2200    2200    2200    2200    3903    0092     folic acid (FOLVITE) tablet 1 mg Dose: 1 mg Freq: Daily Route: PO Start: 02/17/16 1000   1009    1000    1000    1000    1000    1000    1000     labetalol (NORMODYNE) tablet 100 mg Dose: 100 mg Freq: 2 times daily Route: PO Start: 02/18/16 1000   Admin Instructions:  (BETA BLOCKER)   1009   2200    1000   2200    1000   2200    1000   2200    1000   2200    1000   2200    1000   2200     nicotine  (NICODERM CQ - dosed in mg/24 hours) patch 21 mg Dose: 21 mg Freq: Daily Route: TD Start: 02/14/16 1330   Admin Instructions:  Remove old patch before applying new patch   1009   1010    0959   1000    1000    1000    1000    1000    1000     thiamine (VITAMIN B-1) tablet 100 mg Dose: 100 mg Freq: Daily Route: PO Start: 02/17/16 1000   1009    1000    1000    1000    1000    1000    1000     Medications 02/19/16 02/20/16 02/21/16 02/22/16 02/23/16 02/24/16 02/25/16      Previous Inpatient Admission/Date/Reason: Three hospital admissions in six months. Back pain, Fall and intoxication/ SI.    Emotional Health/Current Symptoms  Suicide/Self Harm:   Suicide Attempt in Past (date/description): Patient presented to ED intoxicated reporting to drink his self to death.   Other Harmful Behavior (ex. homicidal ideation) (describe):  No HI   Psychotic/Dissociative Symptoms  Psychotic/Dissociative Symptoms:   Other Psychotic/Dissociative Symptoms:  No reported auditory or visual hallucinations.  Attention/Behavioral Symptoms  Attention/Behavioral Symptoms: Within Normal Limits Other Attention/Behavioral Symptoms:  Homeless, recently came to [place] area from [place].    Cognitive Impairment  Cognitive Impairment:  Orientation - Self, Orientation - Situation, Orientation - Place, Orientation - Time, Within Normal Limits Other Cognitive Impairment:  Alert and Oriented    Mood and Adjustment  Mood and Adjustment:   Calm and Cooperative, [place] about retrieving his belongings    Stress, Anxiety, Trauma, Any Recent Loss/Stressor  Stress, Anxiety, Trauma, Any Recent Loss/Stressor: None Reported Anxiety (frequency):  None reported  Phobia (specify):  None reported  Compulsive Behavior (specify):  None reported  Obsessive Behavior (specify):  None reported  Other Stress, Anxiety, Trauma, Any Recent Loss/Stressor:  Family, employment,  housing.    Substance Abuse/Use  Substance Abuse/Use: Current Substance Use SBIRT Completed (please refer for detailed history): N/A Self-reported Substance Use (last use and frequency): Alcohol use, day of admission   Urinary Drug Screen Completed: Yes Alcohol Level: 371     Environment/Housing/Living Arrangement  Environmental/Housing/Living Arrangement:   Who is in the Home:  Homeless  Emergency Contact:  No contacts listed.    Financial  Financial:  (Medicaid Potential )   Patient's Strengths and Goals  Patient's Strengths and Goals (patient's own words):  " I have heard of these services ([facility], [facility]) and use the [facility].   Clinical Social Worker's Interpretive Summary  Clinical Social Workers Interpretive Summary:  LCSWA met with patient at bedside, explained reason for consult. At this time patient denies SI/HI. Patient reports he has been living in tent community with friends. Patient recently was kicked out of his tent and lost most of his belongings. He moved to [place] from [place] after loosing his job and lost contact with family. Patient reports he is often depressed when he reflects on his life and has SI. Patient reports he drinks a lot of beer and drinks vodka. Patient reports he is open to going to outpatient to follow up with services. LCSWA provided patient with information for [facility] and [facility] services for his depression and alcohol use. Patient reports he is not interested in residential, at this time he is concerned about recovering his belongings.LCSWA explained how patient can follow up, and provided contact information.  LCSWA explained appointment time and walk In services for [facility]. LCSWA provided patient with shelter list and [facility] information. Patient reports he is familiar with the [facility] services.   LCSWA provided patient with bus tickets.     Disposition  Disposition: Outpatient Referral Made/Needed

## 2016-02-19 NOTE — Discharge Instructions (Signed)
Finding Treatment for Addiction °Introduction °WHAT IS ADDICTION? °Addiction is a complex disease of the brain. It causes an uncontrollable (compulsive) need for a substance. You can be addicted to alcohol, illegal drugs, or prescription medicines such as painkillers. Addiction can also be a behavior, like gambling or shopping. The need for the drug or activity can become so strong that you think about it all the time. You can also become physically dependent on a substance. °Addiction can change the way your brain works. Because of these changes, getting more of whatever you are addicted to becomes the most important thing to you and feels better than other activities or relationships. Addiction can lead to changes in health, behavior, emotions, relationships, and choices that affect you and everyone around you. °HOW DO I KNOW IF I NEED TREATMENT FOR ADDICTION? °Addiction is a progressive disease. Without treatment, addiction can get worse. Living with addiction puts you at higher risk for injury, poor health, lost employment, loss of money, and even death. °You might need treatment for addiction if: °· You have tried to stop or cut down, but you cannot. °· Your addiction is causing physical health problems. °· You find it annoying that your friends and family are concerned about your alcohol or substance use. °· You feel guilty about substance abuse or a compulsive behavior. °· You have lied or tried to hide your addiction. °· You need a particular substance or activity to start your day or to calm down. °· You are getting in trouble at school, work, home, or with the police. °· You have done something illegal to support your addiction. °· You are running out of money because of your addiction. °· You have no time for anything other than your addiction. °WHAT TYPES OF TREATMENT ARE AVAILABLE? °The treatment program that is right for you will depend on many factors, including the type of addiction you have.  Treatment programs can be outpatient or inpatient. In an outpatient program, you live at home and go to work or school, but you also go to a clinic for treatment. With an inpatient program, you live and sleep at the program facility during treatment. After treatment, you might need a plan for support during recovery. Other treatment options include: °· Medicine. °¨ Some addictions may be treated with prescription medicines. °¨ You might also need medicine to treat anxiety or depression. °· Counseling and behavior therapy. Therapy can help individuals and families behave in healthier ways and relate more effectively. °· Support groups. Confidential group therapy, such as a 12-step program, can help individuals and families during treatment and recovery. °No single type of program is right for everyone. Many treatment programs involve a combination of education, counseling, and a 12-step, spiritually-based approach. Some treatment programs are government sponsored. They are geared for patients who do not have private insurance. Treatment programs can vary in many respects, such as: °· Cost and types of insurance that are accepted. °· Types of on-site medical services that are offered. °· Length of stay, setting, and size. °· Overall philosophy of treatment. °WHAT SHOULD I CONSIDER WHEN SELECTING A TREATMENT PROGRAM? °It is important to think about your individual requirements when selecting a treatment program. There are a number of things to consider, such as: °· If the program is certified by the appropriate government agency. Even private programs must be certified and employ certified professionals. °· If the program is covered by your insurance. If finances are a concern, the first call you should make   is to your insurance company, if you have health insurance. Ask for a list of treatment programs that are in your network, and confirm any copayments and deductibles that you may have to pay. °¨ If you do not have  insurance, or if you choose to attend a program that does not accept your insurance, discuss whether a payment plan can be set up. °· If treatment is available in languages other than English, if needed. °· If the program offers detoxification treatment, if needed. °· If 12-step meetings are held at the center or if transport is available for patients to attend meetings at other locations. °· If the program is professional, organized, and clean. °· If the program meets all of your needs, including physical and cultural needs. °· If the facility offers specific treatment for your particular addiction. °· If support continues to be offered after you have left the program. °· If your treatment plan is continually looked at to make sure you are receiving the right treatment at the right time. °· If mental health counseling is part of your treatment. °· If medicine is included in treatment, if needed. °· If your family is included in your treatment plan and if support is offered to them throughout the treatment process. °· How the treatment works to prevent relapse. °WHERE ELSE CAN I GET HELP? °· Your health care provider. Ask him or her to help you find addiction treatment. These discussions are confidential. °· The National Council on Alcoholism and Drug Dependence (NCADD). This group has information about treatment centers and programs for people who have an addiction and for family members. °¨ The telephone number is 1-800-NCA-CALL (1-800-622-2255). °¨ The website is https://ncadd.org/about-ncadd/our-affiliates °· The Substance Abuse and Mental Health Services Administration (SAMHSA). This group will help you find publicly funded treatment centers, help hotlines, and counseling services near you. °¨ The telephone number is 1-800-662-HELP (1-800-662-4357). °¨ The website is www.findtreatment.samhsa.gov °In countries outside of the [place] and [place], look in local directories for contact information for services in your  area. °This information is not intended to replace advice given to you by your health care provider. Make sure you discuss any questions you have with your health care provider. °Document Released: 02/04/2005 Document Revised: 08/14/2015 Document Reviewed: 12/25/2013 °© 2017 Elsevier ° °

## 2016-02-19 NOTE — Discharge Summary (Signed)
Physician Discharge Summary  Barry Moore [id number] DOB: 06-22-1965 DOA: 02/14/2016  PCP: No PCP Per Patient  Admit date: 02/14/2016 Discharge date: 02/19/2016  Recommendations for Outpatient Follow-up:  1. Continue folic acid, thiamine on discharge 2. Continue labetalol for BP control   Discharge Diagnoses:  Principal Problem:   Severe alcohol withdrawal without perceptual disturbances without complication (HCC) Active Problems:   Major depressive disorder, recurrent severe without psychotic features (HCC)   Alcohol use disorder, severe, dependence ([place])   Alcoholic hepatitis without ascites   Thrombocytopenia ([place])    Discharge Condition: stable   Diet recommendation: as tolerated   History of present illness:  50 y.o.male, homeless, with known alcohol use disorder, severe depressionwho presented with suicidality and alcohol withdrawal.  Hospital Course:   Assessment & Plan:  Severe alcohol withdrawal - No further withdrawals - Seen by psych, provided outpt rehab resources - No need for sitter at the bedside - Continue thiamine, folic acid  Major depressive disorder, recurrent severe without psychotic features ([place]), with suicidal ideation - Okay for outpt rehab per psych recommendations   Hypertension, essential - from alcohol withdrawal - added low dose Labetalol scheduled  - BP 140/88, HR 84  Alcohol use disorder, severe, dependence ([place]) - will need rehab outpatient.  Fatty liver and elevated liver enzymes  - due to alcohol abuse - LFT's trending down   Thrombocytopenia (HCC) - due to liver disease in the setting of alcohol abuse - no signs of bleeding  DVT prophylaxis: Lovenox SQ Code Status: Full  Family Communication: Patient at bedside    Consultants:   Psych   Procedures:   None  Antimicrobials:   None    Signed:  Leisa Lenz, MD  [facility] 02/19/2016, 10:18 AM  Pager #: [phone]  Time  spent in minutes:less than 30 minutes   Discharge Exam: Vitals:   02/19/16 0602 02/19/16 1012  BP: 140/89 140/88  Pulse: 76 84  Resp: 17 20  Temp: 98.2 F (36.8 C) 98.6 F (37 C)   Vitals:   02/18/16 2019 02/19/16 0239 02/19/16 0602 02/19/16 1012  BP: 133/80 138/89 140/89 140/88  Pulse: 81 82 76 84  Resp: 18 18 17 20   Temp: 98.3 F (36.8 C) 98 F (36.7 C) 98.2 F (36.8 C) 98.6 F (37 C)  TempSrc: Oral Oral Oral Oral  SpO2: 96% 99% 99% 98%  Weight:      Height:        General: Pt is alert, follows commands appropriately, not in acute distress Cardiovascular: Regular rate and rhythm, S1/S2 +, no murmurs Respiratory: Clear to auscultation bilaterally, no wheezing, no crackles, no rhonchi Abdominal: Soft, non tender, non distended, bowel sounds +, no guarding Extremities: no edema, no cyanosis, pulses palpable bilaterally DP and PT Neuro: Grossly nonfocal  Discharge Instructions  Discharge Instructions    Call MD for:  persistant nausea and vomiting    Complete by:  As directed    Call MD for:  redness, tenderness, or signs of infection (pain, swelling, redness, odor or green/yellow discharge around incision site)    Complete by:  As directed    Call MD for:  severe uncontrolled pain    Complete by:  As directed    Diet - low sodium heart healthy    Complete by:  As directed    Discharge instructions    Complete by:  As directed    Continue labetalol for blood pressure control   Increase activity slowly    Complete  by:  As directed        Medication List    STOP taking these medications   ibuprofen 800 MG tablet Commonly known as:  ADVIL,MOTRIN     TAKE these medications   folic acid 1 MG tablet Commonly known as:  FOLVITE Take 1 tablet (1 mg total) by mouth daily. Start taking on:  02/20/2016   labetalol 100 MG tablet Commonly known as:  NORMODYNE Take 1 tablet (100 mg total) by mouth 2 (two) times daily.   thiamine 100 MG tablet Take 1 tablet (100  mg total) by mouth daily. Start taking on:  02/20/2016         The results of significant diagnostics from this hospitalization (including imaging, microbiology, ancillary and laboratory) are listed below for reference.    Significant Diagnostic Studies: US Abdomen Limited Ruq  Result Date: 02/15/2016 CLINICAL DATA:  Abnormal liver function test EXAM: US ABDOMEN LIMITED - RIGHT UPPER QUADRANT COMPARISON:  None. FINDINGS: Gallbladder: No gallstones or wall thickening visualized. No sonographic Murphy sign noted by sonographer. Common bile duct: Diameter: 4.3 mm in diameter within normal limits Liver: No focal hepatic mass. There is diffuse increased echogenicity of the liver suspicious for fatty infiltration. IMPRESSION: 1. No gallstones are noted within gallbladder.  Normal CBD. 2. No focal hepatic mass. Diffuse increased echogenicity of the liver suspicious for fatty infiltration. Electronically Signed   By: Lahoma Crocker M.D.   On: 02/15/2016 16:49    Microbiology: Recent Results (from the past 240 hour(s))  MRSA PCR Screening     Status: None   Collection Time: 02/14/16 11:40 PM  Result Value Ref Range Status   MRSA by PCR NEGATIVE NEGATIVE Final    Comment:        The GeneXpert MRSA Assay (FDA approved for NASAL specimens only), is one component of a comprehensive MRSA colonization surveillance program. It is not intended to diagnose MRSA infection nor to guide or monitor treatment for MRSA infections.      Labs: Basic Metabolic Panel:  Recent Labs Lab 02/14/16 1040 02/14/16 1053 02/15/16 0332 02/16/16 0325  NA 140  --  140 138  K 4.1  --  3.9 3.6  CL 105  --  105 106  CO2 25  --  28 25  GLUCOSE 85  --  105* 108*  BUN 11  --  16 13  CREATININE 0.75  --  0.76 0.84  CALCIUM 9.3  --  9.3 8.8*  MG  --  2.3  --   --    Liver Function Tests:  Recent Labs Lab 02/14/16 1040 02/15/16 0332 02/16/16 0325  AST 191* 125* 112*  ALT 153* 124* 116*  ALKPHOS 137* 126  122  BILITOT 0.7 1.2 1.1  PROT 8.2* 7.2 6.8  ALBUMIN 4.7 4.0 3.6    Recent Labs Lab 02/15/16 1005  LIPASE 36    Recent Labs Lab 02/15/16 1000 02/16/16 0641  AMMONIA 51* 37*   CBC:  Recent Labs Lab 02/14/16 1040 02/15/16 0332 02/16/16 0325  WBC 6.3 8.3 6.2  NEUTROABS 3.4  --  3.9  HGB 15.6 13.8 13.4  HCT 44.7 40.4 39.3  MCV 95.5 96.0 96.1  PLT 149* 142* 134*   Cardiac Enzymes:  Recent Labs Lab 02/15/16 1005  CKTOTAL 110   BNP: BNP (last 3 results) No results for input(s): BNP in the last 8760 hours.  ProBNP (last 3 results) No results for input(s): PROBNP in the last 8760 hours.  CBG:  No results for input(s): GLUCAP in the last 168 hours.

## 2016-02-19 NOTE — Progress Notes (Signed)
Pt will need to call [facility] for an appointment on Monday.  Instructions given to his RN, related to pt taking a shower at present time.

## 2016-02-19 NOTE — Progress Notes (Signed)
Reviewed discharge information with patient. Answered all questions. Patient able to teach back medications and reasons to contact MD/911. Patient verbalizes importance of PCP follow up appointment and to call [facility] on Monday for appt. Barbee [place]ro[place]. Brigitte Pulse, RN

## 2016-03-04 ENCOUNTER — Emergency Department (HOSPITAL_COMMUNITY)
Admission: EM | Admit: 2016-03-04 | Discharge: 2016-03-05 | Disposition: A | Discharge status: Home / Self Care | Payer: Federal, State, Local not specified - Other

## 2016-03-04 DIAGNOSIS — I10 Essential (primary) hypertension: Secondary | ICD-10-CM | POA: Insufficient documentation

## 2016-03-04 DIAGNOSIS — Z79899 Other long term (current) drug therapy: Secondary | ICD-10-CM | POA: Insufficient documentation

## 2016-03-04 DIAGNOSIS — F17210 Nicotine dependence, cigarettes, uncomplicated: Secondary | ICD-10-CM | POA: Insufficient documentation

## 2016-03-04 DIAGNOSIS — F10120 Alcohol abuse with intoxication, uncomplicated: Secondary | ICD-10-CM | POA: Insufficient documentation

## 2016-03-04 DIAGNOSIS — F332 Major depressive disorder, recurrent severe without psychotic features: Secondary | ICD-10-CM | POA: Diagnosis present

## 2016-03-04 DIAGNOSIS — R45851 Suicidal ideations: Secondary | ICD-10-CM

## 2016-03-04 DIAGNOSIS — F1024 Alcohol dependence with alcohol-induced mood disorder: Secondary | ICD-10-CM | POA: Diagnosis present

## 2016-03-04 DIAGNOSIS — F10920 Alcohol use, unspecified with intoxication, uncomplicated: Secondary | ICD-10-CM

## 2016-03-04 LAB — RAPID URINE DRUG SCREEN, HOSP PERFORMED
Amphetamines: NOT DETECTED
BARBITURATES: NOT DETECTED
BENZODIAZEPINES: NOT DETECTED
COCAINE: NOT DETECTED
Opiates: NOT DETECTED
TETRAHYDROCANNABINOL: NOT DETECTED

## 2016-03-04 LAB — CBC WITH DIFFERENTIAL/PLATELET
BASOS ABS: 0.1 10*3/uL (ref 0.0–0.1)
BASOS PCT: 1 %
Eosinophils Absolute: 0.4 10*3/uL (ref 0.0–0.7)
Eosinophils Relative: 4 %
HEMATOCRIT: 40.7 % (ref 39.0–52.0)
HEMOGLOBIN: 15 g/dL (ref 13.0–17.0)
LYMPHS PCT: 30 %
Lymphs Abs: 2.5 10*3/uL (ref 0.7–4.0)
MCH: 33.7 pg (ref 26.0–34.0)
MCHC: 36.9 g/dL — AB (ref 30.0–36.0)
MCV: 91.5 fL (ref 78.0–100.0)
MONO ABS: 0.7 10*3/uL (ref 0.1–1.0)
MONOS PCT: 9 %
NEUTROS ABS: 4.6 10*3/uL (ref 1.7–7.7)
NEUTROS PCT: 56 %
Platelets: 244 10*3/uL (ref 150–400)
RBC: 4.45 MIL/uL (ref 4.22–5.81)
RDW: 13.1 % (ref 11.5–15.5)
WBC: 8.2 10*3/uL (ref 4.0–10.5)

## 2016-03-04 LAB — URINALYSIS, ROUTINE W REFLEX MICROSCOPIC
BILIRUBIN URINE: NEGATIVE
GLUCOSE, UA: NEGATIVE mg/dL
HGB URINE DIPSTICK: NEGATIVE
KETONES UR: NEGATIVE mg/dL
Leukocytes, UA: NEGATIVE
NITRITE: NEGATIVE
Protein, ur: NEGATIVE mg/dL
SPECIFIC GRAVITY, URINE: 1.003 — AB (ref 1.005–1.030)
pH: 5 (ref 5.0–8.0)

## 2016-03-04 LAB — COMPREHENSIVE METABOLIC PANEL
ALBUMIN: 4.4 g/dL (ref 3.5–5.0)
ALT: 74 U/L — ABNORMAL HIGH (ref 17–63)
ANION GAP: 12 (ref 5–15)
AST: 61 U/L — AB (ref 15–41)
Alkaline Phosphatase: 101 U/L (ref 38–126)
BUN: 7 mg/dL (ref 6–20)
CHLORIDE: 105 mmol/L (ref 101–111)
CO2: 24 mmol/L (ref 22–32)
Calcium: 8.9 mg/dL (ref 8.9–10.3)
Creatinine, Ser: 0.81 mg/dL (ref 0.61–1.24)
GFR calc Af Amer: >60 mL/min (ref >60)
GFR calc non Af Amer: >60 mL/min (ref >60)
GLUCOSE: 81 mg/dL (ref 65–99)
POTASSIUM: 3.9 mmol/L (ref 3.5–5.1)
SODIUM: 141 mmol/L (ref 135–145)
Total Bilirubin: 0.5 mg/dL (ref 0.3–1.2)
Total Protein: 7.7 g/dL (ref 6.5–8.1)

## 2016-03-04 LAB — ACETAMINOPHEN LEVEL: Acetaminophen (Tylenol), Serum: <10 ug/mL — ABNORMAL LOW (ref 10–30)

## 2016-03-04 LAB — SALICYLATE LEVEL

## 2016-03-04 LAB — ETHANOL: Alcohol, Ethyl (B): 366 mg/dL (ref <5)

## 2016-03-04 MED ORDER — ONDANSETRON HCL 4 MG PO TABS: 4.0000 mg | ORAL_TABLET | Freq: Three times a day (TID) | ORAL | Status: DC | PRN

## 2016-03-04 MED ORDER — LORAZEPAM 1 MG PO TABS: 0.0000 mg | ORAL_TABLET | Freq: Two times a day (BID) | ORAL | Status: DC

## 2016-03-04 MED ORDER — LORAZEPAM 1 MG PO TABS
0.0000 mg | ORAL_TABLET | Freq: Four times a day (QID) | ORAL | Status: DC
Start: 2016-03-04 — End: 2016-03-05
  Administered 2016-03-04: 2 mg via ORAL
  Administered 2016-03-05: 1 mg via ORAL
  Administered 2016-03-05: 2 mg via ORAL
  Filled 2016-03-04 (×2): qty 2
  Filled 2016-03-04: qty 1

## 2016-03-04 MED ORDER — THIAMINE HCL 100 MG/ML IJ SOLN: 100.0000 mg | Freq: Every day | INTRAMUSCULAR | Status: DC

## 2016-03-04 MED ORDER — IBUPROFEN 200 MG PO TABS: 600.0000 mg | ORAL_TABLET | Freq: Three times a day (TID) | ORAL | Status: DC | PRN

## 2016-03-04 MED ORDER — NICOTINE 21 MG/24HR TD PT24: 21.0000 mg | MEDICATED_PATCH | Freq: Every day | TRANSDERMAL | Status: DC | PRN

## 2016-03-04 MED ORDER — ACETAMINOPHEN 325 MG PO TABS
650.0000 mg | ORAL_TABLET | ORAL | Status: DC | PRN
  Administered 2016-03-05: 650 mg via ORAL
  Filled 2016-03-04: qty 2

## 2016-03-04 MED ORDER — ALUM & MAG HYDROXIDE-SIMETH 200-200-20 MG/5ML PO SUSP: 30.0000 mL | ORAL | Status: DC | PRN

## 2016-03-04 MED ORDER — VITAMIN B-1 100 MG PO TABS
100.0000 mg | ORAL_TABLET | Freq: Every day | ORAL | Status: DC
  Administered 2016-03-04 – 2016-03-05 (×2): 100 mg via ORAL
  Filled 2016-03-04 (×2): qty 1

## 2016-03-04 NOTE — Progress Notes (Signed)
Please use the resources provided to you in emergency room by case manager to assist you're your choice of doctor for follow up  These [place] uninsured resources provide possible primary care providers, resources for discounted medications, housing, dental resources, affordable care act information, plus other resources for [place]

## 2016-03-04 NOTE — ED Notes (Signed)
Bed: [facility] Expected date:  Expected time:  Means of arrival:  Comments: 

## 2016-03-04 NOTE — Progress Notes (Signed)
ED CM left pt uninsured [place] resources in his belonging bag CM spoke with pt who confirms uninsured [employer] resident with no pcp.  CM provided written information to assist pt with determining choice for uninsured accepting pcps, discussed the importance of pcp vs EDP services for f/u care, www.needymeds.org, www.goodrx.com, discounted pharmacies and other [employer] such as [employer] [facility] [employer], affordable care act, financial assistance, uninsured dental services, [place] med assist, DSS and  health department  Provided resources for [employer] uninsured accepting pcps like [facility], family medicine at [employer], [facility], [facility], local urgent care centers, [facility], [facility] family practice, general medical clinics, family services of the [place], [facility] urgent care plus others, medication resources, [facility] out patient pharmacies and housing Provided [employer]

## 2016-03-04 NOTE — ED Notes (Signed)
Bed: [facility] Expected date:  Expected time:  Means of arrival:  Comments: EMS- 50s, M, ETOH/SI

## 2016-03-04 NOTE — BH Assessment (Signed)
Tele Assessment Note   Barry Moore is an 50 y.o. male. Pt reports SI with a plan to shoot himself. Pt reports HI. Pt will not disclose who he wants to kills or if he has a plan. Pt reports auditory and visual hallucinations.  Pt states "I'm tired of being depressed and homeless." Pt reports the following depressive symptoms: isolating, fatigue, loss of interest in activities, and depressed mood most of the day. Pt denies current outpatient treatment. Pt reports previous inpatient treatment but he cannot remember when or where. Pt denies other drug use. Pt denies self-harming behaviors. Pt reports ongoing alcohol abuse. Pt states "I drink as much as I can." Pt denies current family/friend support.  Writer consulted with Theodoro Clock, [place]. Per Theodoro Clock Pt will be re-evaluated in the am.   Diagnosis:  F33.2 MDD; F10.20 Alcohol use, severe  Past Medical History:  Past Medical History:  Diagnosis Date  . Hypertension     No past surgical history on file.  Family History:  Family History  Problem Relation Age of Onset  . Diabetes Mother   . Emphysema Father   . Lung cancer Father     Social History:  reports that he has been smoking Cigarettes.  He has been smoking about 2.00 packs per day. He has never used smokeless tobacco. He reports that he drinks alcohol. He reports that he does not use drugs.  Additional Social History:  Alcohol / Drug Use Pain Medications: Pt denies Prescriptions: Pt denies Over the Counter: Pt denies Longest period of sobriety (when/how long): NA Withdrawal Symptoms: Agitation, Seizures, Aggressive/Assaultive, Fever / Chills, Nausea / Vomiting Onset of Seizures: unknown Date of most recent seizure: 7 months ago Substance #1 Name of Substance 1: Alcohol 1 - Age of First Use: unknown 1 - Amount (size/oz): "as much as I can" 1 - Frequency: daily 1 - Duration: ongoing  CIWA: CIWA-Ar BP: 138/94 Pulse Rate: 94 Nausea and Vomiting: no nausea and no  vomiting Tactile Disturbances: none Tremor: no tremor Auditory Disturbances: not present Paroxysmal Sweats: barely perceptible sweating, palms moist Visual Disturbances: not present Anxiety: two Headache, Fullness in Head: mild Agitation: somewhat more than normal activity Orientation and Clouding of Sensorium: cannot do serial additions or is uncertain about date CIWA-Ar Total: 7 COWS:    PATIENT STRENGTHS: (choose at least two) Capable of independent living Communication skills  Allergies:  Allergies  Allergen Reactions  . Aspirin Nausea And Vomiting    Home Medications:  (Not in a hospital admission)  OB/GYN Status:  No LMP for male patient.  General Assessment Data [facility] of Assessment: [facility] TTS Assessment: In system Is this a Tele or Face-to-Face Assessment?: Face-to-Face Is this an Initial Assessment or a Re-assessment for this encounter?: Initial Assessment Marital status: Single Maiden name: NA Is patient pregnant?: No Pregnancy Status: No Living Arrangements: Other (Comment) (homeless) Can pt return to current living arrangement?: Yes Admission Status: Voluntary Is patient capable of signing voluntary admission?: Yes Referral Source: Self/Family/Friend Insurance type: NA     Crisis Care Plan Living Arrangements: Other (Comment) (homeless) Legal Guardian: Other: (self) Name of Psychiatrist: NA Name of Therapist: NA  Education Status Is patient currently in school?: No Current Grade: NA Highest grade of school patient has completed: 52 Name of school: NA Contact person: NA  Risk to self with the past 6 months Suicidal Ideation: Yes-Currently Present Has patient been a risk to self within the past 6 months prior to admission? : Yes Suicidal Intent: Yes-Currently  Present Has patient had any suicidal intent within the past 6 months prior to admission? : Yes Is patient at risk for suicide?: Yes Suicidal Plan?: Yes-Currently Present Has patient  had any suicidal plan within the past 6 months prior to admission? : No Specify Current Suicidal Plan: NA Access to Means: Yes Specify Access to Suicidal Means: access to pills What has been your use of drugs/alcohol within the last 12 months?: alcohol Previous Attempts/Gestures: Yes How many times?: 2 Other Self Harm Risks: alcohol Triggers for Past Attempts: None known Intentional Self Injurious Behavior: None Family Suicide History: No Recent stressful life event(s): Other (Comment) (homelessness) Persecutory voices/beliefs?: Yes Depression: Yes Depression Symptoms: Tearfulness, Insomnia, Loss of interest in usual pleasures, Feeling worthless/self pity, Feeling angry/irritable, Guilt Substance abuse history and/or treatment for substance abuse?: Yes Suicide prevention information given to non-admitted patients: Not applicable  Risk to Others within the past 6 months Homicidal Ideation: Yes-Currently Present Does patient have any lifetime risk of violence toward others beyond the six months prior to admission? : No Thoughts of Harm to Others: No Current Homicidal Intent: No Current Homicidal Plan: No Access to Homicidal Means: No Identified Victim: no History of harm to others?: No Assessment of Violence: None Noted Violent Behavior Description: NA Does patient have access to weapons?: No Criminal Charges Pending?: Yes Describe Pending Criminal Charges: charges in [place] Does patient have a court date: No Is patient on probation?: No  Psychosis Hallucinations: Auditory, Visual Delusions: None noted  Mental Status Report Appearance/Hygiene: In scrubs Eye Contact: Poor Motor Activity: Unremarkable Speech: Logical/coherent Level of Consciousness: Alert Mood: Depressed Affect: Depressed Anxiety Level: None Thought Processes: Coherent, Relevant Judgement: Unimpaired Orientation: Person, Place, Situation, Time Obsessive Compulsive Thoughts/Behaviors: None  Cognitive  Functioning Concentration: Decreased Memory: Remote Intact, Recent Intact IQ: Average Insight: Fair Impulse Control: Fair Appetite: Fair Weight Loss: 0 Weight Gain: 0 Sleep: Decreased Total Hours of Sleep: 3 Vegetative Symptoms: None  ADLScreening [facility] Assessment Services) Patient's cognitive ability adequate to safely complete daily activities?: Yes Patient able to express need for assistance with ADLs?: Yes Independently performs ADLs?: Yes (appropriate for developmental age)  Prior Inpatient Therapy Prior Inpatient Therapy: Yes Prior Therapy Dates: unknown Prior Therapy Facilty/Provider(s): unknown Reason for Treatment: unknown  Prior Outpatient Therapy Prior Outpatient Therapy: No Prior Therapy Dates: NA Prior Therapy Facilty/Provider(s): NA Reason for Treatment: NA Does patient have an ACCT team?: No Does patient have Intensive In-House Services?  : No Does patient have [facility] services? : No Does patient have P4CC services?: No  ADL Screening (condition at time of admission) Patient's cognitive ability adequate to safely complete daily activities?: Yes Is the patient deaf or have difficulty hearing?: No Does the patient have difficulty seeing, even when wearing glasses/contacts?: No Does the patient have difficulty concentrating, remembering, or making decisions?: No Patient able to express need for assistance with ADLs?: Yes Does the patient have difficulty dressing or bathing?: No Independently performs ADLs?: Yes (appropriate for developmental age) Does the patient have difficulty walking or climbing stairs?: No Weakness of Legs: None Weakness of Arms/Hands: None       Abuse/Neglect Assessment (Assessment to be complete while patient is alone) Physical Abuse: Denies Verbal Abuse: Denies Sexual Abuse: Denies Exploitation of patient/patient's resources: Denies Self-Neglect: Denies     Regulatory affairs officer (For Healthcare) Does Patient Have a Medical  Advance Directive?: No    Additional Information 1:1 In Past 12 Months?: No CIRT Risk: No Elopement Risk: No Does patient have medical clearance?: Yes  Disposition:  Disposition Initial Assessment Completed for this Encounter: Yes Disposition of Patient: Other dispositions (re-eval in the am) Type of inpatient treatment program: Adult Other disposition(s): Other (Comment)  Bekka Qian D 03/04/2016 6:02 PM

## 2016-03-04 NOTE — ED Notes (Addendum)
Pt has been wanded by security. Pt belongings: wallet, black watch, comb, arm band, green sweat shirt, jeans, brown boots. Pt belongings placed I locker 31

## 2016-03-04 NOTE — ED Triage Notes (Signed)
Pt c/o ETOH with last consumption this am (20 40oz beers and 5th of vodka.) Pt partner called 911 for SI. Pt states per [facility] "If I had a gun I'd blow my brains out." Pt reports no food for x2 days. Pt presents in NAD, drowsy with verbal response, hypertensive, and requesting food.

## 2016-03-04 NOTE — ED Provider Notes (Signed)
[facility] DEPT Provider Note   CSN: [id number] Arrival date & time: 03/04/16  1528     History   Chief Complaint Chief Complaint  Patient presents with  . Suicidal  . Alcohol Intoxication    HPI Barry Moore is a 50 y.o. male.  50 year old Caucasian male with a past medical history significant for bipolar disorder, alcohol abuse, alcoholic hepatitis, hypertension that presents to the ED today by EMS for suicidal ideations and alcohol intoxication. Patient states that he wants to cut his wrist with knives. Per EMS report patient told them that "if I had a gun I'd blow my brains out". Patient has history of suicide attempt. He has been seen in the ED with admission to behavioral health unit in the past. Patient denies any attempt at this time. States that he is homeless and has nothing and just "wants his life to end". Patient also endorses homicidal ideation. States that he would "punch people in her throat in the life". He also endorses hearing voices are telling him to hurt himself. He denies any visual hallucinations. Patient has a history of alcohol withdrawal seizures and states that he drank 2040 ounce beers and a fifth of vodka this a.m. Patient states that he does not have any money for food and has not eaten the past 2 days. Patient is requesting food at this time. He denies any fevers, chills, headache, vision changes, lightheadedness, dizziness, chest pain, shortness of breath, abdominal pain, nausea, emesis, urinary symptoms, change in bowel habits, numbness/tingling.        Past Medical History:  Diagnosis Date  . Hypertension     Patient Active Problem List   Diagnosis Date Noted  . Major depressive disorder, recurrent severe without psychotic features ([place]) 02/14/2016  . Alcohol use disorder, severe, dependence ([place]) 02/14/2016  . Severe alcohol withdrawal without perceptual disturbances without complication ([place]) [id number]  . Alcoholic hepatitis without  ascites 02/14/2016  . Thrombocytopenia ([place]) 02/14/2016    No past surgical history on file.     Home Medications    Prior to Admission medications   Medication Sig Start Date End Date Taking? Authorizing Provider  folic acid (FOLVITE) 1 MG tablet Take 1 tablet (1 mg total) by mouth daily. 02/20/16   Robbie Lis, MD  labetalol (NORMODYNE) 100 MG tablet Take 1 tablet (100 mg total) by mouth 2 (two) times daily. 02/19/16   Robbie Lis, MD  thiamine 100 MG tablet Take 1 tablet (100 mg total) by mouth daily. 02/20/16   Robbie Lis, MD    Family History Family History  Problem Relation Age of Onset  . Diabetes Mother   . Emphysema Father   . Lung cancer Father     Social History Social History  Substance Use Topics  . Smoking status: Current Every Day Smoker    Packs/day: 2.00    Types: Cigarettes  . Smokeless tobacco: Never Used  . Alcohol use Yes     Comment: [employer]      Allergies   Aspirin   Review of Systems Review of Systems  Constitutional: Positive for appetite change (unable to afford food). Negative for chills and fever.  HENT: Negative for congestion, ear pain, rhinorrhea and sore throat.   Eyes: Negative for pain and discharge.  Respiratory: Negative for cough and shortness of breath.   Cardiovascular: Negative for chest pain and palpitations.  Gastrointestinal: Negative for abdominal pain, diarrhea, nausea and vomiting.  Genitourinary: Negative for flank pain, frequency, hematuria  and urgency.  Musculoskeletal: Negative for myalgias and neck pain.  Skin: Negative.   Neurological: Negative for dizziness, syncope, weakness, light-headedness, numbness and headaches.  Psychiatric/Behavioral: Positive for hallucinations and suicidal ideas.  All other systems reviewed and are negative.    Physical Exam Updated Vital Signs BP 138/94 (BP Location: Right Arm)   Pulse 94   Temp 98.2 F (36.8 C) (Oral)   Resp 18   SpO2 96%   Physical Exam    Constitutional: He is oriented to person, place, and time. He appears well-developed and well-nourished. No distress.  HENT:  Head: Normocephalic and atraumatic.  Right Ear: Tympanic membrane, external ear and ear canal normal.  Left Ear: Tympanic membrane, external ear and ear canal normal.  Nose: Nose normal.  Mouth/Throat: Uvula is midline, oropharynx is clear and moist and mucous membranes are normal.  Eyes: Conjunctivae are normal. Pupils are equal, round, and reactive to light. Right eye exhibits no discharge. Left eye exhibits no discharge. No scleral icterus.  Neck: Normal range of motion. Neck supple. No thyromegaly present.  Cardiovascular: Normal rate, regular rhythm, normal heart sounds and intact distal pulses.  Exam reveals no gallop and no friction rub.   No murmur heard. Pulmonary/Chest: Effort normal and breath sounds normal. No respiratory distress. He has no wheezes.  Abdominal: Soft. Bowel sounds are normal. He exhibits no distension. There is no tenderness. There is no rebound and no guarding.  Musculoskeletal: Normal range of motion.  Lymphadenopathy:    He has no cervical adenopathy.  Neurological: He is alert and oriented to person, place, and time.  Skin: Skin is warm and dry. Capillary refill takes less than 2 seconds.  Psychiatric: Judgment normal. His speech is delayed. He is withdrawn and actively hallucinating. Thought content is delusional. Cognition and memory are normal. He does not exhibit a depressed mood (crying on exam due to social issues). He expresses suicidal ideation. He expresses suicidal plans.  Nursing note and vitals reviewed.    ED Treatments / Results  Labs (all labs ordered are listed, but only abnormal results are displayed) Labs Reviewed  COMPREHENSIVE METABOLIC PANEL - Abnormal; Notable for the following:       Result Value   AST 61 (*)    ALT 74 (*)    All other components within normal limits  ETHANOL - Abnormal; Notable for the  following:    Alcohol, Ethyl (B) 366 (*)    All other components within normal limits  CBC WITH DIFFERENTIAL/PLATELET - Abnormal; Notable for the following:    MCHC 36.9 (*)    All other components within normal limits  ACETAMINOPHEN LEVEL - Abnormal; Notable for the following:    Acetaminophen (Tylenol), Serum <10 (*)    All other components within normal limits  URINALYSIS, ROUTINE W REFLEX MICROSCOPIC - Abnormal; Notable for the following:    Color, Urine COLORLESS (*)    Specific Gravity, Urine 1.003 (*)    All other components within normal limits  RAPID URINE DRUG SCREEN, HOSP PERFORMED  SALICYLATE LEVEL    EKG  EKG Interpretation  Date/Time:  Thursday March 04 2016 17:54:04 EST Ventricular Rate:  79 PR Interval:  152 QRS Duration: 90 QT Interval:  366 QTC Calculation: 419 R Axis:   85 Text Interpretation:  Normal sinus rhythm Early repolarization Otherwise within normal limits Confirmed by Carmin Muskrat  MD [phone]) on 03/04/2016 5:04:14 PM       Radiology No results found.  Procedures Procedures (including critical care  time)  Medications Ordered in ED Medications  LORazepam (ATIVAN) tablet 0-4 mg (0 mg Oral Not Given 03/04/16 1851)    Followed by  LORazepam (ATIVAN) tablet 0-4 mg (not administered)  thiamine (VITAMIN B-1) tablet 100 mg (100 mg Oral Given 03/04/16 1911)    Or  thiamine (B-1) injection 100 mg ( Intravenous See Alternative 03/04/16 1911)  acetaminophen (TYLENOL) tablet 650 mg (not administered)  ibuprofen (ADVIL,MOTRIN) tablet 600 mg (not administered)  ondansetron (ZOFRAN) tablet 4 mg (not administered)  alum & mag hydroxide-simeth (MAALOX/MYLANTA) 200-200-20 MG/5ML suspension 30 mL (not administered)  nicotine (NICODERM CQ - dosed in mg/24 hours) patch 21 mg (not administered)     Initial Impression / Assessment and Plan / ED Course  I have reviewed the triage vital signs and the nursing notes.  Pertinent labs & imaging results  that were available during my care of the patient were reviewed by me and considered in my medical decision making (see chart for details).  Clinical Course   Patient presents to the ED today for suicidal ideations, homicidal ideation, alcohol intoxication. Medical screening labs were unremarkable except for elevated alcohol level of 366 and liver enzymes were elevated but down from prior results patient with history of alcoholic hepatitis. EKG was unremarkable. Patient feels like he will go through alcohol withdrawals and has a history of alcohol withdrawal seizures. Asked nurse to perform CIWA and will order CIWA orders for ativan. Patient is requesting food on my exam. Very tearful on my exam due to him being homeless and wanting to hurt himself. Patient is actively [place] and I have consulted psychiatry for placement. They recommend patient have re-eval in the AM. Patient see what was performed. From medical standpoint patient has no medical complaints. He is medically cleared and will await re-eval in the morning with CIWA protocol and place. Dicussed patient with Dr. Vanita Panda.  Final Clinical Impressions(s) / ED Diagnoses   Final diagnoses:  Alcoholic intoxication without complication ([place])  Suicidal ideations    New Prescriptions New Prescriptions   No medications on file     Doristine Devoid, Hershal Coria 03/04/16 1918    Carmin Muskrat, MD 03/04/16 [phone]

## 2016-03-05 ENCOUNTER — Inpatient Hospital Stay (HOSPITAL_COMMUNITY)
Admission: AD | Admit: 2016-03-05 | Discharge: 2016-03-09 | DRG: 885 | Disposition: A | Discharge status: Home / Self Care | Payer: Federal, State, Local not specified - Other | Source: Intra-hospital | Attending: Psychiatry | Admitting: Psychiatry

## 2016-03-05 ENCOUNTER — Encounter (HOSPITAL_COMMUNITY): Payer: Self-pay

## 2016-03-05 DIAGNOSIS — Z9114 Patient's other noncompliance with medication regimen: Secondary | ICD-10-CM | POA: Diagnosis not present

## 2016-03-05 DIAGNOSIS — Z811 Family history of alcohol abuse and dependence: Secondary | ICD-10-CM

## 2016-03-05 DIAGNOSIS — F17210 Nicotine dependence, cigarettes, uncomplicated: Secondary | ICD-10-CM | POA: Diagnosis present

## 2016-03-05 DIAGNOSIS — Z590 Homelessness: Secondary | ICD-10-CM

## 2016-03-05 DIAGNOSIS — F419 Anxiety disorder, unspecified: Secondary | ICD-10-CM | POA: Diagnosis present

## 2016-03-05 DIAGNOSIS — F1024 Alcohol dependence with alcohol-induced mood disorder: Secondary | ICD-10-CM | POA: Diagnosis present

## 2016-03-05 DIAGNOSIS — Z888 Allergy status to other drugs, medicaments and biological substances status: Secondary | ICD-10-CM

## 2016-03-05 DIAGNOSIS — Z801 Family history of malignant neoplasm of trachea, bronchus and lung: Secondary | ICD-10-CM

## 2016-03-05 DIAGNOSIS — F10259 Alcohol dependence with alcohol-induced psychotic disorder, unspecified: Secondary | ICD-10-CM | POA: Diagnosis present

## 2016-03-05 DIAGNOSIS — Z602 Problems related to living alone: Secondary | ICD-10-CM | POA: Diagnosis present

## 2016-03-05 DIAGNOSIS — F10239 Alcohol dependence with withdrawal, unspecified: Secondary | ICD-10-CM | POA: Diagnosis present

## 2016-03-05 DIAGNOSIS — G4700 Insomnia, unspecified: Secondary | ICD-10-CM | POA: Diagnosis present

## 2016-03-05 DIAGNOSIS — I10 Essential (primary) hypertension: Secondary | ICD-10-CM | POA: Diagnosis present

## 2016-03-05 DIAGNOSIS — R440 Auditory hallucinations: Secondary | ICD-10-CM | POA: Diagnosis present

## 2016-03-05 DIAGNOSIS — K7010 Alcoholic hepatitis without ascites: Secondary | ICD-10-CM | POA: Diagnosis not present

## 2016-03-05 DIAGNOSIS — F332 Major depressive disorder, recurrent severe without psychotic features: Secondary | ICD-10-CM | POA: Diagnosis present

## 2016-03-05 DIAGNOSIS — Z560 Unemployment, unspecified: Secondary | ICD-10-CM | POA: Diagnosis not present

## 2016-03-05 DIAGNOSIS — Z818 Family history of other mental and behavioral disorders: Secondary | ICD-10-CM

## 2016-03-05 DIAGNOSIS — Z833 Family history of diabetes mellitus: Secondary | ICD-10-CM

## 2016-03-05 DIAGNOSIS — Y908 Blood alcohol level of 240 mg/100 ml or more: Secondary | ICD-10-CM | POA: Diagnosis present

## 2016-03-05 DIAGNOSIS — F10230 Alcohol dependence with withdrawal, uncomplicated: Secondary | ICD-10-CM | POA: Diagnosis not present

## 2016-03-05 DIAGNOSIS — Z8489 Family history of other specified conditions: Secondary | ICD-10-CM

## 2016-03-05 DIAGNOSIS — Z825 Family history of asthma and other chronic lower respiratory diseases: Secondary | ICD-10-CM | POA: Diagnosis not present

## 2016-03-05 DIAGNOSIS — R45851 Suicidal ideations: Secondary | ICD-10-CM

## 2016-03-05 DIAGNOSIS — R441 Visual hallucinations: Secondary | ICD-10-CM | POA: Diagnosis present

## 2016-03-05 DIAGNOSIS — R45850 Homicidal ideations: Secondary | ICD-10-CM | POA: Diagnosis present

## 2016-03-05 DIAGNOSIS — F29 Unspecified psychosis not due to a substance or known physiological condition: Secondary | ICD-10-CM | POA: Diagnosis present

## 2016-03-05 DIAGNOSIS — Z79899 Other long term (current) drug therapy: Secondary | ICD-10-CM

## 2016-03-05 DIAGNOSIS — F1020 Alcohol dependence, uncomplicated: Secondary | ICD-10-CM

## 2016-03-05 DIAGNOSIS — D696 Thrombocytopenia, unspecified: Secondary | ICD-10-CM | POA: Diagnosis present

## 2016-03-05 MED ORDER — IBUPROFEN 600 MG PO TABS: 600.0000 mg | ORAL_TABLET | Freq: Three times a day (TID) | ORAL | Status: DC | PRN

## 2016-03-05 MED ORDER — LORAZEPAM 1 MG PO TABS
0.0000 mg | ORAL_TABLET | Freq: Four times a day (QID) | ORAL | Status: DC
  Administered 2016-03-05 – 2016-03-06 (×2): 1 mg via ORAL
  Filled 2016-03-05 (×2): qty 1

## 2016-03-05 MED ORDER — ACETAMINOPHEN 325 MG PO TABS: 650.0000 mg | ORAL_TABLET | ORAL | Status: DC | PRN

## 2016-03-05 MED ORDER — HYDROXYZINE HCL 25 MG PO TABS
25.0000 mg | ORAL_TABLET | Freq: Every evening | ORAL | Status: AC | PRN
  Administered 2016-03-05: 25 mg via ORAL
  Filled 2016-03-05 (×3): qty 1

## 2016-03-05 MED ORDER — VITAMIN B-1 100 MG PO TABS
100.0000 mg | ORAL_TABLET | Freq: Every day | ORAL | Status: DC
  Administered 2016-03-06: 100 mg via ORAL
  Filled 2016-03-05 (×2): qty 1

## 2016-03-05 MED ORDER — GABAPENTIN 100 MG PO CAPS
200.0000 mg | ORAL_CAPSULE | Freq: Two times a day (BID) | ORAL | Status: DC
  Administered 2016-03-05: 200 mg via ORAL
  Filled 2016-03-05: qty 2

## 2016-03-05 MED ORDER — GABAPENTIN 100 MG PO CAPS
200.0000 mg | ORAL_CAPSULE | Freq: Two times a day (BID) | ORAL | Status: DC
  Administered 2016-03-05 – 2016-03-07 (×4): 200 mg via ORAL
  Filled 2016-03-05 (×10): qty 2

## 2016-03-05 MED ORDER — NICOTINE 21 MG/24HR TD PT24: 21.0000 mg | MEDICATED_PATCH | Freq: Every day | TRANSDERMAL | Status: DC | PRN

## 2016-03-05 MED ORDER — ALUM & MAG HYDROXIDE-SIMETH 200-200-20 MG/5ML PO SUSP: 30.0000 mL | ORAL | Status: DC | PRN

## 2016-03-05 MED ORDER — FLUOXETINE HCL 10 MG PO CAPS
10.0000 mg | ORAL_CAPSULE | Freq: Every day | ORAL | Status: DC
  Administered 2016-03-05: 10 mg via ORAL
  Filled 2016-03-05: qty 1

## 2016-03-05 MED ORDER — NICOTINE POLACRILEX 2 MG MT GUM: 2.0000 mg | CHEWING_GUM | OROMUCOSAL | Status: DC | PRN

## 2016-03-05 MED ORDER — MAGNESIUM HYDROXIDE 400 MG/5ML PO SUSP: 30.0000 mL | Freq: Every day | ORAL | Status: DC | PRN

## 2016-03-05 MED ORDER — LORAZEPAM 1 MG PO TABS: 0.0000 mg | ORAL_TABLET | Freq: Two times a day (BID) | ORAL | Status: DC

## 2016-03-05 MED ORDER — THIAMINE HCL 100 MG/ML IJ SOLN: 100.0000 mg | Freq: Every day | INTRAMUSCULAR | Status: DC

## 2016-03-05 MED ORDER — FLUOXETINE HCL 10 MG PO CAPS
10.0000 mg | ORAL_CAPSULE | Freq: Every day | ORAL | Status: DC
  Administered 2016-03-06: 10 mg via ORAL
  Filled 2016-03-05 (×2): qty 1

## 2016-03-05 MED ORDER — ONDANSETRON HCL 4 MG PO TABS: 4.0000 mg | ORAL_TABLET | Freq: Three times a day (TID) | ORAL | Status: DC | PRN

## 2016-03-05 NOTE — Progress Notes (Signed)
Writer has observed patient up in the dayroom watching tv and no interaction with peers. He was compliant with his scheduled medications and requested a sleep aid which he received. He reported to Probation officer that he has been experiencing visual hallucinations where he thought he saw a black cat running across his room. He denies si/hi or auditory hallucinations. Support given and safety maintained on unit with 15 min checks.

## 2016-03-05 NOTE — ED Notes (Signed)
Patient escorted to shower for privacy. Patient educated about search process and term "contraband " and routine search performed. No contraband found.
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
## 2016-03-05 NOTE — Progress Notes (Signed)
03/05/16 1337:  Pt introduced self to pt and offered activities, pt declined.  Victorino Sparrow, LRT/CTRS

## 2016-03-05 NOTE — Progress Notes (Signed)
Laquincy is a 50 year old male being admitted voluntarily to [facility] from [facility].  He came to the ED with suicidal and homicidal ideation.  He had a plan to shoot self but wouldn't disclose who he wanted to harm.  He reported a hisotry of depression and is currently homeless.  He has history of HTN and cardiac problems but cannot explain.  He is unable to afford his medications so he hasn't been on them in months.  He has been drinking steadily since 11/21/15 and averages 42 ounces of beer daily.  He currently denies SI/HI and is able to contract for safety on the unit.  He hears voices and they are calling his name.  He denies any pain or discomfort and appears to be in no physical distress.  Oriented him to the unit.  Admission paperwork completed and signed.  Belongings searched and secured in locker # 61.  Skin assessment completed and noted tattoos on both his arm and no other skin issues noted.  Q 15 minute checks initiated for safety.  We will monitor the progress towards his goals.

## 2016-03-05 NOTE — BH Assessment (Signed)
[facility] Assessment Progress Note  Per Corena Pilgrim, MD, this pt requires psychiatric hospitalization at this time.  [name], RN, [facility] has assigned pt to [facility] Rm 504-2.  Pt has signed Voluntary Admission and Consent for Treatment, as well as Consent to Release Information to no one, and signed forms have been faxed to [facility].  Pt's nurse, Jan, has been notified, and agrees to send original paperwork along with pt via [facility], and to call report to [phone].  Jalene Mullet, [facility] Triage Specialist [phone]

## 2016-03-05 NOTE — Tx Team (Signed)
Initial Treatment Plan 03/05/2016 5:24 PM [name] [id number]    PATIENT STRESSORS: Financial difficulties Health problems Marital or family conflict Substance abuse   PATIENT STRENGTHS: Capable of independent living General fund of knowledge Motivation for treatment/growth   PATIENT IDENTIFIED PROBLEMS: Depression  Anxiety  Suicidal ideation  Homicidal ideation  Substance abuse  "Try to figure out why I keep getting suicidal thoughts"  "I want to quit drinking"         DISCHARGE CRITERIA:  Improved stabilization in mood, thinking, and/or behavior Verbal commitment to aftercare and medication compliance Withdrawal symptoms are absent or subacute and managed without 24-hour nursing intervention  PRELIMINARY DISCHARGE PLAN: Outpatient therapy Medication management  PATIENT/FAMILY INVOLVEMENT: This treatment plan has been presented to and reviewed with the patient, Barry Moore.  The patient and family have been given the opportunity to ask questions and make suggestions.  Windell Moment, RN 03/05/2016, 5:24 PM

## 2016-03-05 NOTE — Consult Note (Signed)
[facility] Face-to-Face Psychiatry Consult   Reason for Consult:  Psych evaluation Referring Physician:  EDP Patient Identification: Barry Moore MRN:  [id number] Principal Diagnosis: Major depressive disorder, recurrent severe without psychotic features [facility]) Diagnosis:   Patient Active Problem List   Diagnosis Date Noted  . Major depressive disorder, recurrent severe without psychotic features ([place]) [F33.2] 02/14/2016    Priority: High  . Alcohol use disorder, severe, dependence ([place]) [F10.20] 02/14/2016    Priority: High  . Severe alcohol withdrawal without perceptual disturbances without complication ([place]) [Q67.619] 02/14/2016  . Alcoholic hepatitis without ascites [K70.10] 02/14/2016  . Thrombocytopenia ([place]) [D69.6] 02/14/2016    Total Time spent with patient: 45 minutes  Subjective:   Barry Moore is a 50 y.o. male patient admitted with suicidal thoughts and alcohol intoxication.  HPI:  50 year old Caucasian male with a past  history significant for Depression, mood disorder,  alcohol use disorder, alcoholic hepatitis, hypertension who came to [facility] due to worsening depression, suicidal thoughts with plan to blow his head off. Patient reports that he is homeless and has no food to it. However, he has been drinking alcohol on a daily basis. Lat drink was yesterday when he consumed 20 40 oz of beer and a 5th of liquor. Patient is requesting for alcohol detox and he is currently unable to contract for safety.   Past Psychiatric History: as above   Risk to Self: Suicidal Ideation: Yes-Currently Present Suicidal Intent: Yes-Currently Present Is patient at risk for suicide?: Yes Suicidal Plan?: Yes-Currently Present Specify Current Suicidal Plan: NA Access to Means: Yes Specify Access to Suicidal Means: access to pills What has been your use of drugs/alcohol within the last 12 months?: alcohol How many times?: 2 Other Self Harm Risks: alcohol Triggers for Past Attempts: None  known Intentional Self Injurious Behavior: None Risk to Others: Homicidal Ideation: Yes-Currently Present Thoughts of Harm to Others: No Current Homicidal Intent: No Current Homicidal Plan: No Access to Homicidal Means: No Identified Victim: no History of harm to others?: No Assessment of Violence: None Noted Violent Behavior Description: NA Does patient have access to weapons?: No Criminal Charges Pending?: Yes Describe Pending Criminal Charges: charges in [place] Does patient have a court date: No Prior Inpatient Therapy: Prior Inpatient Therapy: Yes Prior Therapy Dates: unknown Prior Therapy Facilty/Provider(s): unknown Reason for Treatment: unknown Prior Outpatient Therapy: Prior Outpatient Therapy: No Prior Therapy Dates: NA Prior Therapy Facilty/Provider(s): NA Reason for Treatment: NA Does patient have an ACCT team?: No Does patient have Intensive In-House Services?  : No Does patient have [facility] services? : No Does patient have P4CC services?: No  Past Medical History:  Past Medical History:  Diagnosis Date  . Hypertension    No past surgical history on file. Family History:  Family History  Problem Relation Age of Onset  . Diabetes Mother   . Emphysema Father   . Lung cancer Father    Family Psychiatric  History:  Social History:  History  Alcohol Use  . Yes    Comment: Two Forty's      History  Drug Use No    Social History   Social History  . Marital status: Divorced    Spouse name: N/A  . Number of children: N/A  . Years of education: N/A   Social History Main Topics  . Smoking status: Current Every Day Smoker    Packs/day: 2.00    Types: Cigarettes  . Smokeless tobacco: Never Used  . Alcohol use Yes  Comment: Two Forty's   . Drug use: No  . Sexual activity: Not on file   Other Topics Concern  . Not on file   Social History Narrative  . No narrative on file   Additional Social History:    Allergies:   Allergies  Allergen  Reactions  . Aspirin Nausea And Vomiting    Labs:  Results for orders placed or performed during the hospital encounter of 03/04/16 (from the past 48 hour(s))  Urine rapid drug screen (hosp performed)not at [facility]     Status: None   Collection Time: 03/04/16  4:00 PM  Result Value Ref Range   Opiates NONE DETECTED NONE DETECTED   Cocaine NONE DETECTED NONE DETECTED   Benzodiazepines NONE DETECTED NONE DETECTED   Amphetamines NONE DETECTED NONE DETECTED   Tetrahydrocannabinol NONE DETECTED NONE DETECTED   Barbiturates NONE DETECTED NONE DETECTED    Comment:        DRUG SCREEN FOR MEDICAL PURPOSES ONLY.  IF CONFIRMATION IS NEEDED FOR ANY PURPOSE, NOTIFY LAB WITHIN 5 DAYS.        LOWEST DETECTABLE LIMITS FOR URINE DRUG SCREEN Drug Class       Cutoff (ng/mL) Amphetamine      1000 Barbiturate      200 Benzodiazepine   638 Tricyclics       756 Opiates          300 Cocaine          300 THC              50   Urinalysis, Routine w reflex microscopic     Status: Abnormal   Collection Time: 03/04/16  4:00 PM  Result Value Ref Range   Color, Urine COLORLESS (A) YELLOW   APPearance CLEAR CLEAR   Specific Gravity, Urine 1.003 (L) 1.005 - 1.030   pH 5.0 5.0 - 8.0   Glucose, UA NEGATIVE NEGATIVE mg/dL   Hgb urine dipstick NEGATIVE NEGATIVE   Bilirubin Urine NEGATIVE NEGATIVE   Ketones, ur NEGATIVE NEGATIVE mg/dL   Protein, ur NEGATIVE NEGATIVE mg/dL   Nitrite NEGATIVE NEGATIVE   Leukocytes, UA NEGATIVE NEGATIVE  Comprehensive metabolic panel     Status: Abnormal   Collection Time: 03/04/16  4:47 PM  Result Value Ref Range   Sodium 141 135 - 145 mmol/L   Potassium 3.9 3.5 - 5.1 mmol/L   Chloride 105 101 - 111 mmol/L   CO2 24 22 - 32 mmol/L   Glucose, Bld 81 65 - 99 mg/dL   BUN 7 6 - 20 mg/dL   Creatinine, Ser 0.81 0.61 - 1.24 mg/dL   Calcium 8.9 8.9 - 10.3 mg/dL   Total Protein 7.7 6.5 - 8.1 g/dL   Albumin 4.4 3.5 - 5.0 g/dL   AST 61 (H) 15 - 41 U/L   ALT 74 (H) 17 - 63 U/L    Alkaline Phosphatase 101 38 - 126 U/L   Total Bilirubin 0.5 0.3 - 1.2 mg/dL   GFR calc non Af Amer >60 >60 mL/min   GFR calc Af Amer >60 >60 mL/min    Comment: (NOTE) The eGFR has been calculated using the CKD EPI equation. This calculation has not been validated in all clinical situations. eGFR's persistently <60 mL/min signify possible Chronic Kidney Disease.    Anion gap 12 5 - 15  Ethanol     Status: Abnormal   Collection Time: 03/04/16  4:47 PM  Result Value Ref Range   Alcohol, Ethyl (B) 366 (  HH) <5 mg/dL    Comment:        LOWEST DETECTABLE LIMIT FOR SERUM ALCOHOL IS 5 mg/dL FOR MEDICAL PURPOSES ONLY CRITICAL RESULT CALLED TO, READ BACK BY AND VERIFIED WITH: DAVIS,N. AT 1744 03/04/16 BY THOMPSON,N.   CBC with Diff     Status: Abnormal   Collection Time: 03/04/16  4:47 PM  Result Value Ref Range   WBC 8.2 4.0 - 10.5 K/uL   RBC 4.45 4.22 - 5.81 MIL/uL   Hemoglobin 15.0 13.0 - 17.0 g/dL   HCT 40.7 39.0 - 52.0 %   MCV 91.5 78.0 - 100.0 fL   MCH 33.7 26.0 - 34.0 pg   MCHC 36.9 (H) 30.0 - 36.0 g/dL   RDW 13.1 11.5 - 15.5 %   Platelets 244 150 - 400 K/uL   Neutrophils Relative % 56 %   Neutro Abs 4.6 1.7 - 7.7 K/uL   Lymphocytes Relative 30 %   Lymphs Abs 2.5 0.7 - 4.0 K/uL   Monocytes Relative 9 %   Monocytes Absolute 0.7 0.1 - 1.0 K/uL   Eosinophils Relative 4 %   Eosinophils Absolute 0.4 0.0 - 0.7 K/uL   Basophils Relative 1 %   Basophils Absolute 0.1 0.0 - 0.1 K/uL  Salicylate level     Status: None   Collection Time: 03/04/16  4:47 PM  Result Value Ref Range   Salicylate Lvl <5.6 2.8 - 30.0 mg/dL  Acetaminophen level     Status: Abnormal   Collection Time: 03/04/16  4:47 PM  Result Value Ref Range   Acetaminophen (Tylenol), Serum <10 (L) 10 - 30 ug/mL    Comment:        THERAPEUTIC CONCENTRATIONS VARY SIGNIFICANTLY. A RANGE OF 10-30 ug/mL MAY BE AN EFFECTIVE CONCENTRATION FOR MANY PATIENTS. HOWEVER, SOME ARE BEST TREATED AT CONCENTRATIONS OUTSIDE  THIS RANGE. ACETAMINOPHEN CONCENTRATIONS >150 ug/mL AT 4 HOURS AFTER INGESTION AND >50 ug/mL AT 12 HOURS AFTER INGESTION ARE OFTEN ASSOCIATED WITH TOXIC REACTIONS.     Current Facility-Administered Medications  Medication Dose Route Frequency Provider Last Rate Last Dose  . acetaminophen (TYLENOL) tablet 650 mg  650 mg Oral Q4H PRN Doristine Devoid, PA-C   650 mg at 03/05/16 0845  . alum & mag hydroxide-simeth (MAALOX/MYLANTA) 200-200-20 MG/5ML suspension 30 mL  30 mL Oral PRN Doristine Devoid, PA-C      . FLUoxetine (PROZAC) capsule 10 mg  10 mg Oral Daily Lawton Dollinger, MD      . gabapentin (NEURONTIN) capsule 200 mg  200 mg Oral BID Zabella Wease, MD      . ibuprofen (ADVIL,MOTRIN) tablet 600 mg  600 mg Oral Q8H PRN Doristine Devoid, PA-C      . LORazepam (ATIVAN) tablet 0-4 mg  0-4 mg Oral Q6H Doristine Devoid, PA-C   1 mg at 03/05/16 1124   Followed by  . [START ON 03/06/2016] LORazepam (ATIVAN) tablet 0-4 mg  0-4 mg Oral Q12H Kenneth T Leaphart, PA-C      . nicotine (NICODERM CQ - dosed in mg/24 hours) patch 21 mg  21 mg Transdermal Daily PRN Doristine Devoid, PA-C      . ondansetron (ZOFRAN) tablet 4 mg  4 mg Oral Q8H PRN Doristine Devoid, PA-C      . thiamine (VITAMIN B-1) tablet 100 mg  100 mg Oral Daily Doristine Devoid, PA-C   100 mg at 03/05/16 0845   Or  . thiamine (B-1) injection 100 mg  100 mg Intravenous Daily Doristine Devoid, PA-C       Current Outpatient Prescriptions  Medication Sig Dispense Refill  . folic acid (FOLVITE) 1 MG tablet Take 1 tablet (1 mg total) by mouth daily. (Patient not taking: Reported on 03/04/2016) 30 tablet 0  . labetalol (NORMODYNE) 100 MG tablet Take 1 tablet (100 mg total) by mouth 2 (two) times daily. (Patient not taking: Reported on 03/04/2016) 60 tablet 0  . thiamine 100 MG tablet Take 1 tablet (100 mg total) by mouth daily. (Patient not taking: Reported on 03/04/2016) 30 tablet 0    Musculoskeletal: Strength &  Muscle Tone: within normal limits Gait & Station: normal Patient leans: N/A  Psychiatric Specialty Exam: Physical Exam  Psychiatric: His speech is delayed. He is slowed and withdrawn. Cognition and memory are normal. He expresses impulsivity. He exhibits a depressed mood. He expresses suicidal ideation.    Review of Systems  Constitutional: Positive for malaise/fatigue.  HENT: Negative.   Eyes: Negative.   Respiratory: Negative.   Cardiovascular: Negative.   Gastrointestinal: Positive for nausea.  Genitourinary: Negative.   Musculoskeletal: Positive for myalgias.  Skin: Negative.   Neurological: Positive for tremors.  Endo/Heme/Allergies: Negative.   Psychiatric/Behavioral: Positive for depression, substance abuse and suicidal ideas. The patient is nervous/anxious and has insomnia.     Blood pressure 155/99, pulse 99, temperature 98 F (36.7 C), resp. rate 18, SpO2 100 %.There is no height or weight on file to calculate BMI.  General Appearance: Disheveled  Eye Contact:  Minimal  Speech:  Clear and Coherent and Slow  Volume:  Decreased  Mood:  Depressed, Dysphoric and Hopeless  Affect:  Constricted  Thought Process:  Coherent and Descriptions of Associations: Intact  Orientation:  Full (Time, Place, and Person)  Thought Content:  Logical  Suicidal Thoughts:  Yes.  with intent/plan  Homicidal Thoughts:  No  Memory:  Immediate;   Good Recent;   Good Remote;   Good  Judgement:  Poor  Insight:  Shallow  Psychomotor Activity:  Decreased and Psychomotor Retardation  Concentration:  Concentration: Fair and Attention Span: Fair  Recall:  Good  Fund of Knowledge:  Good  Language:  Good  Akathisia:  No  Handed:  Right  AIMS (if indicated):     Assets:  Communication Skills Desire for Improvement  ADL's:  Intact  Cognition:  WNL  Sleep:   poor     Treatment Plan Summary: Daily contact with patient to assess and evaluate symptoms and progress in treatment, Medication  management and Plan Continue Alcohol detox protocol. Start Trazodone 119m Qhs as needed for sleep. Start Gabapentin 2070mbid for alcohol withdrawal and mood  Disposition: Recommend psychiatric Inpatient admission when medically cleared. Supportive therapy provided about ongoing stressors.  AkCorena PilgrimMD 03/05/2016 11:43 AM

## 2016-03-05 NOTE — ED Notes (Signed)
Patient admits to [facility] with a plan to shoot himself. Patient denies HI and AVh at this time. Plan of care discussed with patient. Patient voices no complaints or concerns at this time. Encouragement and support provided and safety maintain. Q 15 min safety checks in place and video monitoring.

## 2016-03-06 ENCOUNTER — Encounter (HOSPITAL_COMMUNITY): Payer: Self-pay | Admitting: Psychiatry

## 2016-03-06 DIAGNOSIS — F332 Major depressive disorder, recurrent severe without psychotic features: Secondary | ICD-10-CM | POA: Diagnosis present

## 2016-03-06 DIAGNOSIS — Z801 Family history of malignant neoplasm of trachea, bronchus and lung: Secondary | ICD-10-CM

## 2016-03-06 DIAGNOSIS — Z811 Family history of alcohol abuse and dependence: Secondary | ICD-10-CM

## 2016-03-06 DIAGNOSIS — F17210 Nicotine dependence, cigarettes, uncomplicated: Secondary | ICD-10-CM

## 2016-03-06 DIAGNOSIS — K7010 Alcoholic hepatitis without ascites: Secondary | ICD-10-CM

## 2016-03-06 DIAGNOSIS — F10230 Alcohol dependence with withdrawal, uncomplicated: Secondary | ICD-10-CM

## 2016-03-06 DIAGNOSIS — Z833 Family history of diabetes mellitus: Secondary | ICD-10-CM

## 2016-03-06 DIAGNOSIS — D696 Thrombocytopenia, unspecified: Secondary | ICD-10-CM

## 2016-03-06 DIAGNOSIS — Z79899 Other long term (current) drug therapy: Secondary | ICD-10-CM

## 2016-03-06 MED ORDER — FLUOXETINE HCL 20 MG PO CAPS
20.0000 mg | ORAL_CAPSULE | Freq: Every day | ORAL | Status: DC
  Administered 2016-03-07 – 2016-03-09 (×3): 20 mg via ORAL
  Filled 2016-03-06: qty 1
  Filled 2016-03-06: qty 7
  Filled 2016-03-06 (×4): qty 1

## 2016-03-06 MED ORDER — LORAZEPAM 1 MG PO TABS
1.0000 mg | ORAL_TABLET | Freq: Three times a day (TID) | ORAL | Status: AC
  Administered 2016-03-07 – 2016-03-08 (×3): 1 mg via ORAL
  Filled 2016-03-06 (×3): qty 1

## 2016-03-06 MED ORDER — HYDROXYZINE HCL 25 MG PO TABS
25.0000 mg | ORAL_TABLET | Freq: Four times a day (QID) | ORAL | Status: AC | PRN
  Filled 2016-03-06: qty 1

## 2016-03-06 MED ORDER — LORAZEPAM 1 MG PO TABS
1.0000 mg | ORAL_TABLET | Freq: Two times a day (BID) | ORAL | Status: AC
  Administered 2016-03-08 – 2016-03-09 (×2): 1 mg via ORAL
  Filled 2016-03-06 (×2): qty 1

## 2016-03-06 MED ORDER — LORAZEPAM 1 MG PO TABS: 1.0000 mg | ORAL_TABLET | Freq: Every day | ORAL | Status: DC

## 2016-03-06 MED ORDER — MIRTAZAPINE 7.5 MG PO TABS
7.5000 mg | ORAL_TABLET | Freq: Every day | ORAL | Status: DC
  Administered 2016-03-06: 7.5 mg via ORAL
  Filled 2016-03-06 (×4): qty 1

## 2016-03-06 MED ORDER — LOPERAMIDE HCL 2 MG PO CAPS: 2.0000 mg | ORAL_CAPSULE | ORAL | Status: AC | PRN

## 2016-03-06 MED ORDER — BENZTROPINE MESYLATE 0.5 MG PO TABS
0.5000 mg | ORAL_TABLET | Freq: Two times a day (BID) | ORAL | Status: DC
  Administered 2016-03-06 – 2016-03-09 (×7): 0.5 mg via ORAL
  Filled 2016-03-06: qty 14
  Filled 2016-03-06 (×5): qty 1
  Filled 2016-03-06: qty 14
  Filled 2016-03-06 (×5): qty 1

## 2016-03-06 MED ORDER — HALOPERIDOL 2 MG PO TABS
2.0000 mg | ORAL_TABLET | Freq: Two times a day (BID) | ORAL | Status: DC
  Administered 2016-03-06 – 2016-03-07 (×3): 2 mg via ORAL
  Filled 2016-03-06 (×8): qty 1

## 2016-03-06 MED ORDER — ADULT MULTIVITAMIN W/MINERALS CH
1.0000 | ORAL_TABLET | Freq: Every day | ORAL | Status: DC
  Administered 2016-03-06 – 2016-03-09 (×4): 1 via ORAL
  Filled 2016-03-06 (×6): qty 1

## 2016-03-06 MED ORDER — LORAZEPAM 1 MG PO TABS
1.0000 mg | ORAL_TABLET | Freq: Four times a day (QID) | ORAL | Status: AC
  Administered 2016-03-06 – 2016-03-07 (×4): 1 mg via ORAL
  Filled 2016-03-06 (×4): qty 1

## 2016-03-06 MED ORDER — VITAMIN B-1 100 MG PO TABS
100.0000 mg | ORAL_TABLET | Freq: Every day | ORAL | Status: DC
  Administered 2016-03-07 – 2016-03-09 (×3): 100 mg via ORAL
  Filled 2016-03-06 (×5): qty 1

## 2016-03-06 MED ORDER — LORAZEPAM 1 MG PO TABS
1.0000 mg | ORAL_TABLET | Freq: Four times a day (QID) | ORAL | Status: AC | PRN
  Administered 2016-03-07: 1 mg via ORAL
  Filled 2016-03-06 (×2): qty 1

## 2016-03-06 NOTE — BHH Suicide Risk Assessment (Signed)
[facility] INPATIENT:  Family/Significant Other Suicide Prevention Education  Suicide Prevention Education:  Patient Refusal for Family/Significant Other Suicide Prevention Education: The patient Barry Moore has refused to provide written consent for family/significant other to be provided Family/Significant Other Suicide Prevention Education during admission and/or prior to discharge.  Physician notified.  Berlin Hun Grossman-Orr 03/06/2016, 5:24 PM

## 2016-03-06 NOTE — Progress Notes (Signed)
D: Patient isolative to his room this shift, but is cooperative and pleasant with staff on assessment. Pt denies SI/HI at this time. A: Q 15 minute safety checks, encourage staff/peer interaction and group participation. Administer medications as ordered. R: No signs/symptoms of distress this shift.

## 2016-03-06 NOTE — BHH Suicide Risk Assessment (Signed)
[facility] Admission Suicide Risk Assessment   Nursing information obtained from:  Patient Demographic factors:  Male, Caucasian, Low socioeconomic status, Unemployed, Living alone Current Mental Status:  NA Loss Factors:  Decline in physical health, Financial problems / change in socioeconomic status Historical Factors:  Prior suicide attempts, Family history of mental illness or substance abuse Risk Reduction Factors:  NA  Total Time spent with patient: 30 minutes  Principal Problem: MDD (major depressive disorder), recurrent severe, without psychosis ([place]) Diagnosis:   Patient Active Problem List   Diagnosis Date Noted  . MDD (major depressive disorder), recurrent severe, without psychosis ([place]) [F33.2] 03/06/2016  . Alcohol use disorder, severe, dependence ([place]) [F10.20] 02/14/2016  . Severe alcohol withdrawal without perceptual disturbances without complication ([place]) [id number] 02/14/2016  . Alcoholic hepatitis without ascites [K70.10] 02/14/2016  . Thrombocytopenia ([place]) [D69.6] 02/14/2016   Subjective Data: PT reports worsening anxiety , mood sx, states he has an alcohol problem. Pt also reports he was suicidal and homicidal on admission. Pt reported SI with plan on admission , was planning to use a knife. Pt reports that it is improving. Pt however continues to have HI towards this ex girl friend's ex. Pt reports he wants to get help and wants to go to a substance abuse program and may be an [facility].    Continued Clinical Symptoms:  Alcohol Use Disorder Identification Test Final Score (AUDIT): 16 The "Alcohol Use Disorders Identification Test", Guidelines for Use in Primary Care, Second Edition.  World Pharmacologist [facility]). Score between 0-7:  no or low risk or alcohol related problems. Score between 8-15:  moderate risk of alcohol related problems. Score between 16-19:  high risk of alcohol related problems. Score 20 or above:  warrants further diagnostic evaluation for  alcohol dependence and treatment.   CLINICAL FACTORS:   Severe Anxiety and/or Agitation Panic Attacks Alcohol/Substance Abuse/Dependencies Unstable or Poor Therapeutic Relationship Previous Psychiatric Diagnoses and Treatments   Musculoskeletal: Strength & Muscle Tone: within normal limits Gait & Station: normal Patient leans: N/A  Psychiatric Specialty Exam: Physical Exam  Review of Systems  Musculoskeletal: Positive for myalgias.  Psychiatric/Behavioral: Positive for depression, hallucinations, substance abuse and suicidal ideas. The patient is nervous/anxious and has insomnia.   All other systems reviewed and are negative.   Blood pressure (!) 140/96, pulse 94, temperature 98.5 F (36.9 C), resp. rate 20, height 5\' 8"  (1.727 m), weight 74.4 kg (164 lb), SpO2 100 %.Body mass index is 24.94 kg/m.  General Appearance: Guarded  Eye Contact:  Fair  Speech:  Clear and Coherent  Volume:  Decreased  Mood:  Anxious and Depressed  Affect:  Appropriate  Thought Process:  Goal Directed and Descriptions of Associations: Intact  Orientation:  Full (Time, Place, and Person)  Thought Content:  Hallucinations: voices, Paranoid Ideation and Rumination  Suicidal Thoughts:  Yes.  with intent/plan  Homicidal Thoughts:  Yes.  without intent/plan  Memory:  Immediate;   Fair Recent;   Fair Remote;   Fair  Judgement:  Impaired  Insight:  Shallow  Psychomotor Activity:  Restlessness  Concentration:  Concentration: Fair and Attention Span: Fair  Recall:  [employer] of Knowledge:  Fair  Language:  Fair  Akathisia:  No  Handed:  Right  AIMS (if indicated):     Assets:  Desire for Improvement  ADL's:  Intact  Cognition:  WNL  Sleep:  Number of Hours: 6.75      COGNITIVE FEATURES THAT CONTRIBUTE TO RISK:  Closed-mindedness, Polarized thinking  and Thought constriction (tunnel vision)    SUICIDE RISK:   Moderate:  Frequent suicidal ideation with limited intensity, and duration, some  specificity in terms of plans, no associated intent, good self-control, limited dysphoria/symptomatology, some risk factors present, and identifiable protective factors, including available and accessible social support.   PLAN OF CARE: Patient with anxiety , mood sx, alcoholism , is motivated to get back on medications and go to a substance abuse treatment program. Start CIWA/Ativan protocol. Start Haldol 2 mg po bid for mood sx/psychosis. Cogentin 0.5 mg po bid for eps. Prozac 20 mg po daily for affective sx. Neurontin 200 mg po bid for anxiety/pain. Case discussed with Ricky Ala NP. PLease also see H&P.  I certify that inpatient services furnished can reasonably be expected to improve the patient's condition.  Alyannah Sanks, MD 03/06/2016, 12:32 PM

## 2016-03-06 NOTE — H&P (Signed)
Psychiatric Admission Assessment Adult  Patient Identification: Barry Moore MRN:  [id number] Date of Evaluation:  03/06/2016 Chief Complaint:  ALCOHOL USE,SEVERE SUBSTANCE INDUCED PSYCHOSIS Principal Diagnosis: Psychosis Diagnosis:   Patient Active Problem List   Diagnosis Date Noted  . Psychosis [F29] 03/05/2016  . Major depressive disorder, recurrent severe without psychotic features ([place]) [F33.2] 02/14/2016  . Alcohol use disorder, severe, dependence ([place]) [F10.20] 02/14/2016  . Severe alcohol withdrawal without perceptual disturbances without complication ([place]) [W09.811] 02/14/2016  . Alcoholic hepatitis without ascites [K70.10] 02/14/2016  . Thrombocytopenia ([place]) [D69.6] 02/14/2016   History of Present Illness:Per counselor notes: Barry Moore is an 50 y.o. male. Pt reports SI with a plan to shoot himself. Pt reports HI. Pt will not disclose who he wants to kills or if he has a plan. Pt reports auditory and visual hallucinations.  Pt states "I'm tired of being depressed and homeless." Pt reports the following depressive symptoms: isolating, fatigue, loss of interest in activities, and depressed mood most of the day. Pt denies current outpatient treatment. Pt reports previous inpatient treatment but he cannot remember when or where. Pt denies other drug use. Pt denies self-harming behaviors. Pt reports ongoing alcohol abuse. Pt states "I drink as much as I can." Pt denies current family/friend support. Associated Signs/Symptoms: Depression Symptoms:  depressed mood, difficulty concentrating, hopelessness, loss of energy/fatigue, (Hypo) Manic Symptoms:  Distractibility, Anxiety Symptoms:  Social Anxiety, Psychotic Symptoms:  Hallucinations: None PTSD Symptoms: Avoidance:  Decreased Interest/Participation Total Time spent with patient: 45 minutes  Past Psychiatric History:  Is the patient at risk to self? Yes.    Has the patient been a risk to self in the past 6 months?  Yes.    Has the patient been a risk to self within the distant past? Yes.    Is the patient a risk to others? No.  Has the patient been a risk to others in the past 6 months? No.  Has the patient been a risk to others within the distant past? No.   Prior Inpatient Therapy:   Prior Outpatient Therapy:    Alcohol Screening: 1. How often do you have a drink containing alcohol?: 4 or more times a week 2. How many drinks containing alcohol do you have on a typical day when you are drinking?: 3 or 4 3. How often do you have six or more drinks on one occasion?: Monthly Preliminary Score: 3 4. How often during the last year have you found that you were not able to stop drinking once you had started?: Monthly 5. How often during the last year have you failed to do what was normally expected from you becasue of drinking?: Monthly 6. How often during the last year have you needed a first drink in the morning to get yourself going after a heavy drinking session?: Never 7. How often during the last year have you had a feeling of guilt of remorse after drinking?: Monthly 8. How often during the last year have you been unable to remember what happened the night before because you had been drinking?: Less than monthly 9. Have you or someone else been injured as a result of your drinking?: No 10. Has a relative or friend or a doctor or another health worker been concerned about your drinking or suggested you cut down?: Yes, but not in the last year Alcohol Use Disorder Identification Test Final Score (AUDIT): 16 Brief Intervention: Yes Substance Abuse History in the last 12 months:  Yes.   Consequences  of Substance Abuse: Negative Previous Psychotropic Medications: YES Psychological Evaluations: YES Past Medical History:  Past Medical History:  Diagnosis Date  . Hypertension    History reviewed. No pertinent surgical history. Family History:  Family History  Problem Relation Age of Onset  .  Diabetes Mother   . Emphysema Father   . Lung cancer Father    Family Psychiatric  History:  Tobacco Screening: Have you used any form of tobacco in the last 30 days? (Cigarettes, Smokeless Tobacco, Cigars, and/or Pipes): Yes Tobacco use, Select all that apply: 5 or more cigarettes per day Are you interested in Tobacco Cessation Medications?: Yes, will notify MD for an order Counseled patient on smoking cessation including recognizing danger situations, developing coping skills and basic information about quitting provided: Refused/Declined practical counseling Social History:  History  Alcohol Use  . Yes    Comment: [employer]      History  Drug Use No    Additional Social History:      Pain Medications: Pt denies Prescriptions: Pt denies Over the Counter: Pt denies History of alcohol / drug use?: Yes Longest period of sobriety (when/how long): NA Withdrawal Symptoms: Agitation, Seizures, Aggressive/Assaultive, Fever / Chills, Nausea / Vomiting Onset of Seizures: unknown Date of most recent seizure: 7 months ago Name of Substance 1: Alcohol 1 - Age of First Use: unknown 1 - Amount (size/oz): "as much as I can" 1 - Frequency: daily 1 - Duration: ongoing 1 - Last Use / Amount: 02/14/16 Unknown amount                  Allergies:   Allergies  Allergen Reactions  . Aspirin Nausea And Vomiting   Lab Results:  Results for orders placed or performed during the hospital encounter of 03/04/16 (from the past 48 hour(s))  Urine rapid drug screen (hosp performed)not at [facility]     Status: None   Collection Time: 03/04/16  4:00 PM  Result Value Ref Range   Opiates NONE DETECTED NONE DETECTED   Cocaine NONE DETECTED NONE DETECTED   Benzodiazepines NONE DETECTED NONE DETECTED   Amphetamines NONE DETECTED NONE DETECTED   Tetrahydrocannabinol NONE DETECTED NONE DETECTED   Barbiturates NONE DETECTED NONE DETECTED    Comment:        DRUG SCREEN FOR MEDICAL PURPOSES ONLY.  IF  CONFIRMATION IS NEEDED FOR ANY PURPOSE, NOTIFY LAB WITHIN 5 DAYS.        LOWEST DETECTABLE LIMITS FOR URINE DRUG SCREEN Drug Class       Cutoff (ng/mL) Amphetamine      1000 Barbiturate      200 Benzodiazepine   197 Tricyclics       588 Opiates          300 Cocaine          300 THC              50   Urinalysis, Routine w reflex microscopic     Status: Abnormal   Collection Time: 03/04/16  4:00 PM  Result Value Ref Range   Color, Urine COLORLESS (A) YELLOW   APPearance CLEAR CLEAR   Specific Gravity, Urine 1.003 (L) 1.005 - 1.030   pH 5.0 5.0 - 8.0   Glucose, UA NEGATIVE NEGATIVE mg/dL   Hgb urine dipstick NEGATIVE NEGATIVE   Bilirubin Urine NEGATIVE NEGATIVE   Ketones, ur NEGATIVE NEGATIVE mg/dL   Protein, ur NEGATIVE NEGATIVE mg/dL   Nitrite NEGATIVE NEGATIVE   Leukocytes, UA NEGATIVE NEGATIVE  Comprehensive metabolic panel     Status: Abnormal   Collection Time: 03/04/16  4:47 PM  Result Value Ref Range   Sodium 141 135 - 145 mmol/L   Potassium 3.9 3.5 - 5.1 mmol/L   Chloride 105 101 - 111 mmol/L   CO2 24 22 - 32 mmol/L   Glucose, Bld 81 65 - 99 mg/dL   BUN 7 6 - 20 mg/dL   Creatinine, Ser 0.81 0.61 - 1.24 mg/dL   Calcium 8.9 8.9 - 10.3 mg/dL   Total Protein 7.7 6.5 - 8.1 g/dL   Albumin 4.4 3.5 - 5.0 g/dL   AST 61 (H) 15 - 41 U/L   ALT 74 (H) 17 - 63 U/L   Alkaline Phosphatase 101 38 - 126 U/L   Total Bilirubin 0.5 0.3 - 1.2 mg/dL   GFR calc non Af Amer >60 >60 mL/min   GFR calc Af Amer >60 >60 mL/min    Comment: (NOTE) The eGFR has been calculated using the CKD EPI equation. This calculation has not been validated in all clinical situations. eGFR's persistently <60 mL/min signify possible Chronic Kidney Disease.    Anion gap 12 5 - 15  Ethanol     Status: Abnormal   Collection Time: 03/04/16  4:47 PM  Result Value Ref Range   Alcohol, Ethyl (B) 366 (HH) <5 mg/dL    Comment:        LOWEST DETECTABLE LIMIT FOR SERUM ALCOHOL IS 5 mg/dL FOR MEDICAL  PURPOSES ONLY CRITICAL RESULT CALLED TO, READ BACK BY AND VERIFIED WITH: DAVIS,N. AT 1744 03/04/16 BY THOMPSON,N.   CBC with Diff     Status: Abnormal   Collection Time: 03/04/16  4:47 PM  Result Value Ref Range   WBC 8.2 4.0 - 10.5 K/uL   RBC 4.45 4.22 - 5.81 MIL/uL   Hemoglobin 15.0 13.0 - 17.0 g/dL   HCT 40.7 39.0 - 52.0 %   MCV 91.5 78.0 - 100.0 fL   MCH 33.7 26.0 - 34.0 pg   MCHC 36.9 (H) 30.0 - 36.0 g/dL   RDW 13.1 11.5 - 15.5 %   Platelets 244 150 - 400 K/uL   Neutrophils Relative % 56 %   Neutro Abs 4.6 1.7 - 7.7 K/uL   Lymphocytes Relative 30 %   Lymphs Abs 2.5 0.7 - 4.0 K/uL   Monocytes Relative 9 %   Monocytes Absolute 0.7 0.1 - 1.0 K/uL   Eosinophils Relative 4 %   Eosinophils Absolute 0.4 0.0 - 0.7 K/uL   Basophils Relative 1 %   Basophils Absolute 0.1 0.0 - 0.1 K/uL  Salicylate level     Status: None   Collection Time: 03/04/16  4:47 PM  Result Value Ref Range   Salicylate Lvl <0.7 2.8 - 30.0 mg/dL  Acetaminophen level     Status: Abnormal   Collection Time: 03/04/16  4:47 PM  Result Value Ref Range   Acetaminophen (Tylenol), Serum <10 (L) 10 - 30 ug/mL    Comment:        THERAPEUTIC CONCENTRATIONS VARY SIGNIFICANTLY. A RANGE OF 10-30 ug/mL MAY BE AN EFFECTIVE CONCENTRATION FOR MANY PATIENTS. HOWEVER, SOME ARE BEST TREATED AT CONCENTRATIONS OUTSIDE THIS RANGE. ACETAMINOPHEN CONCENTRATIONS >150 ug/mL AT 4 HOURS AFTER INGESTION AND >50 ug/mL AT 12 HOURS AFTER INGESTION ARE OFTEN ASSOCIATED WITH TOXIC REACTIONS.     Blood Alcohol level:  Lab Results  Component Value Date   ETH 366 [facility]) 03/04/2016   ETH 371 (HH) 02/14/2016  Metabolic Disorder Labs:  No results found for: HGBA1C, MPG No results found for: PROLACTIN No results found for: CHOL, TRIG, HDL, CHOLHDL, VLDL, LDLCALC  Current Medications: Current Facility-Administered Medications  Medication Dose Route Frequency Provider Last Rate Last Dose  . acetaminophen (TYLENOL) tablet 650 mg   650 mg Oral Q4H PRN Benjamine Mola, FNP      . alum & mag hydroxide-simeth (MAALOX/MYLANTA) 200-200-20 MG/5ML suspension 30 mL  30 mL Oral PRN Benjamine Mola, FNP      . FLUoxetine (PROZAC) capsule 10 mg  10 mg Oral Daily Benjamine Mola, FNP   10 mg at 03/06/16 4481  . gabapentin (NEURONTIN) capsule 200 mg  200 mg Oral BID Benjamine Mola, FNP   200 mg at 03/06/16 8563  . hydrOXYzine (ATARAX/VISTARIL) tablet 25 mg  25 mg Oral QHS,MR X 1 Rozetta Nunnery, NP   25 mg at 03/05/16 2206  . ibuprofen (ADVIL,MOTRIN) tablet 600 mg  600 mg Oral Q8H PRN Benjamine Mola, FNP      . LORazepam (ATIVAN) tablet 0-4 mg  0-4 mg Oral Q6H Benjamine Mola, FNP   1 mg at 03/06/16 0617   Followed by  . LORazepam (ATIVAN) tablet 0-4 mg  0-4 mg Oral Q12H John C Withrow, FNP      . magnesium hydroxide (MILK OF MAGNESIA) suspension 30 mL  30 mL Oral Daily PRN Benjamine Mola, FNP      . nicotine polacrilex (NICORETTE) gum 2 mg  2 mg Oral PRN Ursula Alert, MD      . ondansetron (ZOFRAN) tablet 4 mg  4 mg Oral Q8H PRN Benjamine Mola, FNP      . thiamine (VITAMIN B-1) tablet 100 mg  100 mg Oral Daily Benjamine Mola, FNP   100 mg at 03/06/16 0813   Or  . thiamine (B-1) injection 100 mg  100 mg Intravenous Daily Benjamine Mola, FNP       PTA Medications: Prescriptions Prior to Admission  Medication Sig Dispense Refill Last Dose  . folic acid (FOLVITE) 1 MG tablet Take 1 tablet (1 mg total) by mouth daily. (Patient not taking: Reported on 03/04/2016) 30 tablet 0 Not Taking at Unknown time  . labetalol (NORMODYNE) 100 MG tablet Take 1 tablet (100 mg total) by mouth 2 (two) times daily. (Patient not taking: Reported on 03/04/2016) 60 tablet 0 Not Taking at Unknown time  . thiamine 100 MG tablet Take 1 tablet (100 mg total) by mouth daily. (Patient not taking: Reported on 03/04/2016) 30 tablet 0 Not Taking at Unknown time    Musculoskeletal: Strength & Muscle Tone: within normal limits Gait & Station: normal Patient leans:  N/A  Psychiatric Specialty Exam: Physical Exam  Nursing note and vitals reviewed. Constitutional: He is oriented to person, place, and time.  Neurological: He is alert and oriented to person, place, and time.  Psychiatric: He has a normal mood and affect. His behavior is normal.    Review of Systems  Psychiatric/Behavioral: Positive for depression, substance abuse and suicidal ideas. The patient is nervous/anxious.     Blood pressure (!) 140/96, pulse 94, temperature 98.5 F (36.9 C), resp. rate 20, height 5' 8"  (1.727 m), weight 74.4 kg (164 lb), SpO2 100 %.Body mass index is 24.94 kg/m.  General Appearance: Casual paper scrubs   Eye Contact:  Fair  Speech:  Clear and Coherent  Volume:  Normal  Mood:  Depressed and Hopeless  Affect:  Congruent  Thought Process:  Coherent  Orientation:  Full (Time, Place, and Person)  Thought Content:  Hallucinations: Auditory  Suicidal Thoughts:  No patient reports thought on admission, denies during this assessment  Homicidal Thoughts:  No  Memory:  Immediate;   Fair Recent;   Fair Remote;   Fair  Judgement:  Fair  Insight:  Present  Psychomotor Activity:  Normal  Concentration:  Concentration: Fair  Recall:  [employer] of Knowledge:  Fair  Language:  Fair  Akathisia:  No  Handed:  Right  AIMS (if indicated):     Assets:  Communication Skills Desire for Improvement Housing Resilience Social Support  ADL's:  Intact  Cognition:  WNL  Sleep:  Number of Hours: 6.75    I agree with current treatment plan on 03/06/2016, Patient seen face-to-face for psychiatric evaluation follow-up, chart reviewed and case discussed with the MD [place] , Advanced Practice Provider and Treatment team. Reviewed the information documented and agree with the treatment plan.  Treatment Plan Summary: Daily contact with patient to assess and evaluate symptoms and progress in treatment and Medication management  Continue with Prozac 20 mg, Neurontin 200 mg and  Haldol 2 mg mgs for mood stabilization. Continue with Trazodone 50 mg for insomnia Started on CWIA/ Ativan Protocol Will continue to monitor vitals ,medication compliance and treatment side effects while patient is here.  Reviewed labs: BAL - 366, UDS -  CSW will start working on disposition.  Patient to participate in therapeutic milieu  Observation Level/Precautions:  15 minute checks  Laboratory:  CBC Chemistry Profile UDS UA  Psychotherapy:  individual and group session  Medications:  See above  Consultations:  psychiatry  Discharge Concerns: Safety, stabilization, and risk of access to medication and medication stabilization    Estimated LOS: 5-7day  Other:     Physician Treatment Plan for Primary Diagnosis: Psychosis Long Term Goal(s): Improvement in symptoms so as ready for discharge  Short Term Goals: Ability to identify changes in lifestyle to reduce recurrence of condition will improve, Ability to verbalize feelings will improve and Ability to demonstrate self-control will improve  Physician Treatment Plan for Secondary Diagnosis: Principal Problem:   Psychosis  Long Term Goal(s): Improvement in symptoms so as ready for discharge  Short Term Goals: Ability to maintain clinical measurements within normal limits will improve, Compliance with prescribed medications will improve and Ability to identify triggers associated with substance abuse/mental health issues will improve  I certify that inpatient services furnished can reasonably be expected to improve the patient's condition.    [place], NP 12/16/201710:26 AM

## 2016-03-06 NOTE — Progress Notes (Signed)
Did not attend group 

## 2016-03-06 NOTE — B[facility] Group Notes (Signed)
[place] Group Notes:  (Nursing/MHT/Case Management/Adjunct)  Date:  03/06/2016  Time:  2:23 PM  Type of Therapy:  Nurse Education  Participation Level:  Active  Participation Quality:  Appropriate  Affect:  Appropriate  Cognitive:  Appropriate  Insight:  Appropriate  Engagement in Group:  Engaged  Modes of Intervention:  Discussion  Summary of Progress/Problems:  Aura Dials 03/06/2016, 2:23 PM
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
## 2016-03-06 NOTE — B[facility] Group Notes (Signed)
[facility] Group Notes: (Clinical Social Work)   03/06/2016      Type of Therapy:  Group Therapy   Participation Level:  Did Not Attend despite MHT prompting   Selmer Dominion, LCSW 03/06/2016, 1:47 PM

## 2016-03-06 NOTE — Progress Notes (Signed)
Pt present in the milieu with minimal interaction with peers. States that he is tired of living the way he does and feels demoralized but does not have active SI. Said he continues to have some visual hallucinations and some whispers. Reports good appetite and sleep.

## 2016-03-07 LAB — LIPID PANEL
CHOLESTEROL: 215 mg/dL — AB (ref 0–200)
HDL: 105 mg/dL (ref >40)
LDL Cholesterol: 94 mg/dL (ref 0–99)
TRIGLYCERIDES: 82 mg/dL (ref <150)
Total CHOL/HDL Ratio: 2 RATIO
VLDL: 16 mg/dL (ref 0–40)

## 2016-03-07 LAB — TSH: TSH: 1.038 u[IU]/mL (ref 0.350–4.500)

## 2016-03-07 MED ORDER — HALOPERIDOL 5 MG PO TABS
5.0000 mg | ORAL_TABLET | Freq: Two times a day (BID) | ORAL | Status: DC
  Administered 2016-03-07 – 2016-03-09 (×4): 5 mg via ORAL
  Filled 2016-03-07: qty 14
  Filled 2016-03-07: qty 1
  Filled 2016-03-07: qty 14
  Filled 2016-03-07 (×5): qty 1

## 2016-03-07 MED ORDER — MIRTAZAPINE 15 MG PO TABS
15.0000 mg | ORAL_TABLET | Freq: Every day | ORAL | Status: DC
  Administered 2016-03-07: 15 mg via ORAL
  Filled 2016-03-07 (×3): qty 1

## 2016-03-07 MED ORDER — OLANZAPINE 5 MG PO TBDP
5.0000 mg | ORAL_TABLET | Freq: Three times a day (TID) | ORAL | Status: DC | PRN
  Administered 2016-03-07: 5 mg via ORAL
  Filled 2016-03-07: qty 1

## 2016-03-07 MED ORDER — GABAPENTIN 300 MG PO CAPS
300.0000 mg | ORAL_CAPSULE | Freq: Three times a day (TID) | ORAL | Status: DC
  Filled 2016-03-07 (×6): qty 1

## 2016-03-07 MED ORDER — GABAPENTIN 300 MG PO CAPS
300.0000 mg | ORAL_CAPSULE | ORAL | Status: DC
  Administered 2016-03-07 – 2016-03-09 (×5): 300 mg via ORAL
  Filled 2016-03-07 (×5): qty 1
  Filled 2016-03-07: qty 21
  Filled 2016-03-07: qty 1
  Filled 2016-03-07: qty 21
  Filled 2016-03-07 (×2): qty 1
  Filled 2016-03-07: qty 21
  Filled 2016-03-07 (×3): qty 1

## 2016-03-07 NOTE — Progress Notes (Signed)
D: Patient's self inventory sheet: patient has fair sleep, no sleep medication.good  Appetite, low energy level, poor concentration. Rated depression 9/10, hopeless 8/10, anxiety 5/10. SI/HI/AVH: continues to have thoughts of dying and of killing undisclosed other. Physical complaints are denied. Goal is "stay focus on stay hear to do the right thin". Plans to work on "stay focus".   A: Medications administered, assessed medication knowledge and education given on medication regimen.  Emotional support and encouragement given patient. R: Still continues to have some SI and HI , contracts for safety. Safety maintained with 15 minute checks.

## 2016-03-07 NOTE — Progress Notes (Signed)
[facility] MD Progress Note  03/07/2016 11:58 AM Barry Moore  MRN:  [id number] Subjective: Patient states " I am still anxious ."  Objective:Patient presented with worsening depressive sx , SI and HI. Patient with severe alcohol abuse , noncompliant on medications. Pt today seen and chart reviewed. Pt discussed with treatment team. Pt continues to be anxious , depressed- has withdrawal sx. CIWA/VS reviewed. Pt offered PRN Zyprexa for severe anxiety /agitation. Pt also reports sleep issues, will increase his remeron. Pt per RN continues to endorse some HI towards this person outside of the hospital. Pt continues to be motivated to attend groups and participate in milieu. Continue to encourage and support.    Principal Problem: MDD (major depressive disorder), recurrent severe, without psychosis ([place]) Diagnosis:   Patient Active Problem List   Diagnosis Date Noted  . MDD (major depressive disorder), recurrent severe, without psychosis ([place]) [F33.2] 03/06/2016  . Alcohol use disorder, severe, dependence ([place]) [F10.20] 02/14/2016  . Severe alcohol withdrawal without perceptual disturbances without complication ([place]) [id number] 02/14/2016  . Alcoholic hepatitis without ascites [K70.10] 02/14/2016  . Thrombocytopenia ([place]) [D69.6] 02/14/2016   Total Time spent with patient: 25 minutes  Past Psychiatric History: Please see H&P.   Past Medical History:  Past Medical History:  Diagnosis Date  . Hypertension    History reviewed. No pertinent surgical history. Family History:  Family History  Problem Relation Age of Onset  . Diabetes Mother   . Alcoholism Mother   . Emphysema Father   . Lung cancer Father   . Alcoholism Father    Family Psychiatric  History: Please see H&P.  Social History: Please see H&P.  History  Alcohol Use  . Yes    Comment: Two Forty's      History  Drug Use No    Social History   Social History  . Marital status: Divorced    Spouse name: N/A   . Number of children: N/A  . Years of education: N/A   Social History Main Topics  . Smoking status: Current Every Day Smoker    Packs/day: 2.00    Types: Cigarettes  . Smokeless tobacco: Never Used  . Alcohol use Yes     Comment: Two Forty's   . Drug use: No  . Sexual activity: Not Currently   Other Topics Concern  . None   Social History Narrative  . None   Additional Social History:    Pain Medications: Pt denies Prescriptions: Pt denies Over the Counter: Pt denies History of alcohol / drug use?: Yes Longest period of sobriety (when/how long): NA Withdrawal Symptoms: Agitation, Seizures, Aggressive/Assaultive, Fever / Chills, Nausea / Vomiting Onset of Seizures: unknown Date of most recent seizure: 7 months ago Name of Substance 1: Alcohol 1 - Age of First Use: unknown 1 - Amount (size/oz): "as much as I can" 1 - Frequency: daily 1 - Duration: ongoing 1 - Last Use / Amount: 02/14/16 Unknown amount                  Sleep: restless  Appetite:  Fair  Current Medications: Current Facility-Administered Medications  Medication Dose Route Frequency Provider Last Rate Last Dose  . acetaminophen (TYLENOL) tablet 650 mg  650 mg Oral Q4H PRN Benjamine Mola, FNP      . alum & mag hydroxide-simeth (MAALOX/MYLANTA) 200-200-20 MG/5ML suspension 30 mL  30 mL Oral PRN Benjamine Mola, FNP      . benztropine (COGENTIN) tablet 0.5 mg  0.5 mg  Oral BID Ursula Alert, MD   0.5 mg at 03/07/16 0757  . FLUoxetine (PROZAC) capsule 20 mg  20 mg Oral Daily Ursula Alert, MD   20 mg at 03/07/16 0754  . gabapentin (NEURONTIN) capsule 300 mg  300 mg Oral TID Ursula Alert, MD      . haloperidol (HALDOL) tablet 5 mg  5 mg Oral BID Ursula Alert, MD      . hydrOXYzine (ATARAX/VISTARIL) tablet 25 mg  25 mg Oral Q6H PRN Ursula Alert, MD      . ibuprofen (ADVIL,MOTRIN) tablet 600 mg  600 mg Oral Q8H PRN Benjamine Mola, FNP      . loperamide (IMODIUM) capsule 2-4 mg  2-4 mg Oral PRN  Rubee Vega, MD      . LORazepam (ATIVAN) tablet 1 mg  1 mg Oral Q6H PRN Endya Austin, MD      . LORazepam (ATIVAN) tablet 1 mg  1 mg Oral TID Ursula Alert, MD       Followed by  . [START ON 03/08/2016] LORazepam (ATIVAN) tablet 1 mg  1 mg Oral BID Ursula Alert, MD       Followed by  . [START ON 03/10/2016] LORazepam (ATIVAN) tablet 1 mg  1 mg Oral Daily Shonteria Abeln, MD      . magnesium hydroxide (MILK OF MAGNESIA) suspension 30 mL  30 mL Oral Daily PRN Benjamine Mola, FNP      . mirtazapine (REMERON) tablet 7.5 mg  7.5 mg Oral QHS Ursula Alert, MD   7.5 mg at 03/06/16 2109  . multivitamin with minerals tablet 1 tablet  1 tablet Oral Daily Ursula Alert, MD   1 tablet at 03/07/16 0754  . nicotine polacrilex (NICORETTE) gum 2 mg  2 mg Oral PRN Ursula Alert, MD      . OLANZapine zydis (ZYPREXA) disintegrating tablet 5 mg  5 mg Oral TID PRN Ursula Alert, MD      . ondansetron (ZOFRAN) tablet 4 mg  4 mg Oral Q8H PRN Benjamine Mola, FNP      . thiamine (VITAMIN B-1) tablet 100 mg  100 mg Oral Daily Miking Usrey, MD   100 mg at 03/07/16 [id number]    Lab Results:  Results for orders placed or performed during the hospital encounter of 03/05/16 (from the past 48 hour(s))  TSH     Status: None   Collection Time: 03/07/16  6:32 AM  Result Value Ref Range   TSH 1.038 0.350 - 4.500 uIU/mL    Comment: Performed by a 3rd Generation assay with a functional sensitivity of <=0.01 uIU/mL. Performed at [facility]   Lipid panel     Status: Abnormal   Collection Time: 03/07/16  6:32 AM  Result Value Ref Range   Cholesterol 215 (H) 0 - 200 mg/dL   Triglycerides 82 <150 mg/dL   HDL 105 >40 mg/dL   Total CHOL/HDL Ratio 2.0 RATIO   VLDL 16 0 - 40 mg/dL   LDL Cholesterol 94 0 - 99 mg/dL    Comment:        Total Cholesterol/HDL:CHD Risk Coronary Heart Disease Risk Table                     Men   Women  1/2 Average Risk   3.4   3.3  Average Risk       5.0   4.4  2 X  Average Risk   9.6   7.1  3 X Average Risk  23.4   11.0        Use the calculated Patient Ratio above and the CHD Risk Table to determine the patient's CHD Risk.        ATP III CLASSIFICATION (LDL):  <100     mg/dL   Optimal  100-129  mg/dL   Near or Above                    Optimal  130-159  mg/dL   Borderline  160-189  mg/dL   High  >190     mg/dL   Very High Performed at [facility]     Blood Alcohol level:  Lab Results  Component Value Date   ETH 366 (HH) 03/04/2016   ETH 371 (HH) [id number]    Metabolic Disorder Labs: No results found for: HGBA1C, MPG No results found for: PROLACTIN Lab Results  Component Value Date   CHOL 215 (H) 03/07/2016   TRIG 82 03/07/2016   HDL 105 03/07/2016   CHOLHDL 2.0 03/07/2016   VLDL 16 03/07/2016   LDLCALC 94 03/07/2016    Physical Findings: AIMS: Facial and Oral Movements Muscles of Facial Expression: None, normal Lips and Perioral Area: None, normal Jaw: None, normal Tongue: None, normal,Extremity Movements Upper (arms, wrists, hands, fingers): None, normal Lower (legs, knees, ankles, toes): None, normal, Trunk Movements Neck, shoulders, hips: None, normal, Overall Severity Severity of abnormal movements (highest score from questions above): None, normal Incapacitation due to abnormal movements: None, normal Patient's awareness of abnormal movements (rate only patient's report): No Awareness, Dental Status Current problems with teeth and/or dentures?: No Does patient usually wear dentures?: No  CIWA:  CIWA-Ar Total: 0 COWS:     Musculoskeletal: Strength & Muscle Tone: within normal limits Gait & Station: normal Patient leans: N/A  Psychiatric Specialty Exam: Physical Exam  Nursing note and vitals reviewed.   Review of Systems  Psychiatric/Behavioral: Positive for depression and substance abuse. The patient is nervous/anxious.   All other systems reviewed and are negative.   Blood pressure (!) 128/91,  pulse 90, temperature 98.8 F (37.1 C), resp. rate 20, height 5\' 8"  (1.727 m), weight 74.4 kg (164 lb), SpO2 100 %.Body mass index is 24.94 kg/m.  General Appearance: Disheveled  Eye Contact:  Fair  Speech:  Normal Rate  Volume:  Decreased  Mood:  Anxious, Depressed and Irritable  Affect:  Congruent  Thought Process:  Goal Directed and Descriptions of Associations: Circumstantial  Orientation:  Full (Time, Place, and Person)  Thought Content:  Paranoid Ideation and Rumination  Suicidal Thoughts:  No  Homicidal Thoughts:  Yes.  without intent/plan to his ex grl friend's ex.  Memory:  Immediate;   Fair Recent;   Fair Remote;   Fair  Judgement:  Impaired  Insight:  Shallow  Psychomotor Activity:  Restlessness and Tremor  Concentration:  Concentration: Fair and Attention Span: Fair  Recall:  [employer] of Knowledge:  Fair  Language:  Fair  Akathisia:  No  Handed:  Right  AIMS (if indicated):     Assets:  Desire for Improvement  ADL's:  Intact  Cognition:  WNL  Sleep:  Number of Hours: 6.75     Treatment Plan Summary:Patient with depression, alcohol abuse - continues to have anxiety , irritability , sleep issues - continues to be motivated to go to a substance abuse program. Continue treatment- CSW to work on referral.  Daily contact with patient to assess and  evaluate symptoms and progress in treatment and Medication management MDD (major depressive disorder), recurrent severe, without psychosis ([place]) unstable  Will continue today 03/07/16 plan as below except where it is noted.  For Depression: Prozac 20 mg po daily.  For mood sx/psychosis: Increase Haldol to 5 mg po bid. Cogentin 0.5 mg po bid for EPS.  For anxiety sx: Increase Neurontin to 300 mg po tid.  For alcohol use do: Provided substance abuse counseling. CIWA/Ativan protocol. Refer to substance abuse treatment program - he is very motivated to do so.  For insomnia: Increase Remeron to 15 mg po  qhs.  For anxiety/agitation: PRN medications as per agitation protocol.  Reviewed past medical records,treatment plan.   Will continue to monitor vitals ,medication compliance and treatment side effects while patient is here.   Will monitor for medical issues as well as call consult as needed.   Reviewed labs ,pending hba1c, pl, tsh - wnl, lipid panel - wnl , will also order acute hep panel since AST/ALT abnormal.  I have reviewed his EKG - qtc - wnl.  CSW will continue working on disposition.   Patient to participate in therapeutic milieu .      Chauncey Sciulli, MD 03/07/2016, 11:58 AM

## 2016-03-07 NOTE — [facility] Group Notes (Signed)
[facility] Group Notes:  (Nursing/MHT/Case Management/Adjunct)  Date:  03/07/2016  Time:  11:14 AM  Type of Therapy:  patient self inventory group  Participation Level:  Active  Participation Quality:  Appropriate  Affect:  Appropriate  Cognitive:  Appropriate  Insight:  Appropriate  Engagement in Group:  Engaged  Modes of Intervention:  Discussion  Summary of Progress/Problems:  Aura Dials 03/07/2016, 11:14 AM

## 2016-03-07 NOTE — [facility] Group Notes (Signed)
[facility] Group Notes:  (Clinical Social Work)  03/07/2016  11:00AM-12:00PM  Summary of Progress/Problems:  The main focus of today's process group was to listen to a variety of genres of music and to identify that different types of music provoke different responses.  The patient then was able to identify personally what was soothing for them, as well as energizing, as well as how patient can personally use this knowledge in sleep habits, with depression, and with other symptoms.  The patient expressed at the beginning of group the overall feeling of "down/depressed" and said this was at an 8 out of 10 on a scale indicating 10 as the worst depression.  He stated he felt better at the end of group, but said his depression was still at an 8.  He said he uses music at home a lot.  Type of Therapy:  Music Therapy   Participation Level:  Active  Participation Quality:  Attentive and Sharing  Affect:  Blunted  Cognitive:  Oriented  Insight:  Engaged  Engagement in Therapy:  Engaged  Modes of Intervention:   Activity, Exploration  Selmer Dominion, LCSW 03/07/2016

## 2016-03-08 DIAGNOSIS — R45850 Homicidal ideations: Secondary | ICD-10-CM

## 2016-03-08 LAB — HEMOGLOBIN A1C
Hgb A1c MFr Bld: 5.2 % (ref 4.8–5.6)
Mean Plasma Glucose: 103 mg/dL

## 2016-03-08 LAB — PROLACTIN: PROLACTIN: 23.2 ng/mL — AB (ref 4.0–15.2)

## 2016-03-08 MED ORDER — MIRTAZAPINE 30 MG PO TABS
30.0000 mg | ORAL_TABLET | Freq: Every day | ORAL | Status: DC
  Filled 2016-03-08: qty 7
  Filled 2016-03-08 (×3): qty 1

## 2016-03-08 NOTE — Progress Notes (Signed)
[facility] MD Progress Note  03/08/2016 3:34 PM Barry Moore  MRN:  [id number] Subjective: Patient states " I am still feeling depressed, I am hopeful to get into the [facility] because it's long term treatment."   Objective:Barry Moore is awake, alert and oriented *3. Seen resting in dayroom interacting with peers. Denies suicidal or homicidal ideation. Denies auditory or visual hallucination and does not appear to be responding to internal stimuli.Patient reports he is medication compliant without mediation side effects.  States his depression 8/10. Patient reports he knows that he will have to start with [facility], but doesn't want to relapse when he is discharged from here. Reports good appetite and reports resting "okay." Support, encouragement and reassurance was provided.    Principal Problem: MDD (major depressive disorder), recurrent severe, without psychosis ([place]) Diagnosis:   Patient Active Problem List   Diagnosis Date Noted  . MDD (major depressive disorder), recurrent severe, without psychosis ([place]) [F33.2] 03/06/2016  . Alcohol use disorder, severe, dependence ([place]) [F10.20] 02/14/2016  . Severe alcohol withdrawal without perceptual disturbances without complication ([place]) [id number] 02/14/2016  . Alcoholic hepatitis without ascites [K70.10] 02/14/2016  . Thrombocytopenia ([place]) [D69.6] 02/14/2016   Total Time spent with patient: 25 minutes  Past Psychiatric History: Please see H&P.   Past Medical History:  Past Medical History:  Diagnosis Date  . Hypertension    History reviewed. No pertinent surgical history. Family History:  Family History  Problem Relation Age of Onset  . Diabetes Mother   . Alcoholism Mother   . Emphysema Father   . Lung cancer Father   . Alcoholism Father    Family Psychiatric  History: Please see H&P.  Social History: Please see H&P.  History  Alcohol Use  . Yes    Comment: Two Forty's      History  Drug Use No    Social History    Social History  . Marital status: Divorced    Spouse name: N/A  . Number of children: N/A  . Years of education: N/A   Social History Main Topics  . Smoking status: Current Every Day Smoker    Packs/day: 2.00    Types: Cigarettes  . Smokeless tobacco: Never Used  . Alcohol use Yes     Comment: Two Forty's   . Drug use: No  . Sexual activity: Not Currently   Other Topics Concern  . None   Social History Narrative  . None   Additional Social History:    Pain Medications: Pt denies Prescriptions: Pt denies Over the Counter: Pt denies History of alcohol / drug use?: Yes Longest period of sobriety (when/how long): NA Withdrawal Symptoms: Agitation, Seizures, Aggressive/Assaultive, Fever / Chills, Nausea / Vomiting Onset of Seizures: unknown Date of most recent seizure: 7 months ago Name of Substance 1: Alcohol 1 - Age of First Use: unknown 1 - Amount (size/oz): "as much as I can" 1 - Frequency: daily 1 - Duration: ongoing 1 - Last Use / Amount: 02/14/16 Unknown amount                  Sleep: restless  Appetite:  Fair  Current Medications: Current Facility-Administered Medications  Medication Dose Route Frequency Provider Last Rate Last Dose  . acetaminophen (TYLENOL) tablet 650 mg  650 mg Oral Q4H PRN Benjamine Mola, FNP      . alum & mag hydroxide-simeth (MAALOX/MYLANTA) 200-200-20 MG/5ML suspension 30 mL  30 mL Oral PRN Benjamine Mola, FNP      .  benztropine (COGENTIN) tablet 0.5 mg  0.5 mg Oral BID Ursula Alert, MD   0.5 mg at 03/08/16 0826  . FLUoxetine (PROZAC) capsule 20 mg  20 mg Oral Daily Ursula Alert, MD   20 mg at 03/08/16 0826  . gabapentin (NEURONTIN) capsule 300 mg  300 mg Oral [id number] Ursula Alert, MD   300 mg at 03/08/16 [id number]  . haloperidol (HALDOL) tablet 5 mg  5 mg Oral BID Ursula Alert, MD   5 mg at 03/08/16 0827  . hydrOXYzine (ATARAX/VISTARIL) tablet 25 mg  25 mg Oral Q6H PRN Ursula Alert, MD      . ibuprofen (ADVIL,MOTRIN)  tablet 600 mg  600 mg Oral Q8H PRN Benjamine Mola, FNP      . loperamide (IMODIUM) capsule 2-4 mg  2-4 mg Oral PRN Ursula Alert, MD      . LORazepam (ATIVAN) tablet 1 mg  1 mg Oral Q6H PRN Ursula Alert, MD   1 mg at 03/07/16 2108  . LORazepam (ATIVAN) tablet 1 mg  1 mg Oral BID Ursula Alert, MD       Followed by  . [START ON 03/10/2016] LORazepam (ATIVAN) tablet 1 mg  1 mg Oral Daily Saramma Eappen, MD      . magnesium hydroxide (MILK OF MAGNESIA) suspension 30 mL  30 mL Oral Daily PRN Benjamine Mola, FNP      . mirtazapine (REMERON) tablet 15 mg  15 mg Oral QHS Ursula Alert, MD   15 mg at 03/07/16 2108  . multivitamin with minerals tablet 1 tablet  1 tablet Oral Daily Ursula Alert, MD   1 tablet at 03/08/16 0826  . nicotine polacrilex (NICORETTE) gum 2 mg  2 mg Oral PRN Ursula Alert, MD      . OLANZapine zydis (ZYPREXA) disintegrating tablet 5 mg  5 mg Oral TID PRN Ursula Alert, MD   5 mg at 03/07/16 1207  . ondansetron (ZOFRAN) tablet 4 mg  4 mg Oral Q8H PRN Benjamine Mola, FNP      . thiamine (VITAMIN B-1) tablet 100 mg  100 mg Oral Daily Ursula Alert, MD   100 mg at 03/08/16 [id number]    Lab Results:  Results for orders placed or performed during the hospital encounter of 03/05/16 (from the past 48 hour(s))  TSH     Status: None   Collection Time: 03/07/16  6:32 AM  Result Value Ref Range   TSH 1.038 0.350 - 4.500 uIU/mL    Comment: Performed by a 3rd Generation assay with a functional sensitivity of <=0.01 uIU/mL. Performed at [facility]   Lipid panel     Status: Abnormal   Collection Time: 03/07/16  6:32 AM  Result Value Ref Range   Cholesterol 215 (H) 0 - 200 mg/dL   Triglycerides 82 <150 mg/dL   HDL 105 >40 mg/dL   Total CHOL/HDL Ratio 2.0 RATIO   VLDL 16 0 - 40 mg/dL   LDL Cholesterol 94 0 - 99 mg/dL    Comment:        Total Cholesterol/HDL:CHD Risk Coronary Heart Disease Risk Table                     Men   Women  1/2 Average Risk   3.4    3.3  Average Risk       5.0   4.4  2 X Average Risk   9.6   7.1  3 X Average Risk  23.4   11.0        Use the calculated Patient Ratio above and the CHD Risk Table to determine the patient's CHD Risk.        ATP III CLASSIFICATION (LDL):  <100     mg/dL   Optimal  100-129  mg/dL   Near or Above                    Optimal  130-159  mg/dL   Borderline  160-189  mg/dL   High  >190     mg/dL   Very High Performed at [facility]   Hemoglobin A1c     Status: None   Collection Time: 03/07/16  6:32 AM  Result Value Ref Range   Hgb A1c MFr Bld 5.2 4.8 - 5.6 %    Comment: (NOTE)         Pre-diabetes: 5.7 - 6.4         Diabetes: >6.4         Glycemic control for adults with diabetes: <7.0    Mean Plasma Glucose 103 mg/dL    Comment: (NOTE) Performed At: [facility] [address] [place], [place] [id number] Lindon Romp MD [id number] Performed at [facility]   Prolactin     Status: Abnormal   Collection Time: 03/07/16  6:32 AM  Result Value Ref Range   Prolactin 23.2 (H) 4.0 - 15.2 ng/mL    Comment: (NOTE) Performed At: [facility] [address] [place], [place] [id number] Lindon Romp MD [id number] Performed at [facility]     Blood Alcohol level:  Lab Results  Component Value Date   ETH 366 ([place]) 03/04/2016   ETH 371 (HH) [id number]    Metabolic Disorder Labs: Lab Results  Component Value Date   HGBA1C 5.2 03/07/2016   MPG 103 03/07/2016   Lab Results  Component Value Date   PROLACTIN 23.2 (H) 03/07/2016   Lab Results  Component Value Date   CHOL 215 (H) 03/07/2016   TRIG 82 03/07/2016   HDL 105 03/07/2016   CHOLHDL 2.0 03/07/2016   VLDL 16 03/07/2016   LDLCALC 94 03/07/2016    Physical Findings: AIMS: Facial and Oral Movements Muscles of Facial Expression: None, normal Lips and Perioral Area: None, normal Jaw: None, normal Tongue: None, normal,Extremity Movements Upper  (arms, wrists, hands, fingers): None, normal Lower (legs, knees, ankles, toes): None, normal, Trunk Movements Neck, shoulders, hips: None, normal, Overall Severity Severity of abnormal movements (highest score from questions above): None, normal Incapacitation due to abnormal movements: None, normal Patient's awareness of abnormal movements (rate only patient's report): No Awareness, Dental Status Current problems with teeth and/or dentures?: No Does patient usually wear dentures?: No  CIWA:  CIWA-Ar Total: 3 COWS:     Musculoskeletal: Strength & Muscle Tone: within normal limits Gait & Station: normal Patient leans: N/A  Psychiatric Specialty Exam: Physical Exam  Nursing note and vitals reviewed. Constitutional: He is oriented to person, place, and time. He appears well-developed.  Neurological: He is alert and oriented to person, place, and time.  Psychiatric: He has a normal mood and affect.    Review of Systems  Psychiatric/Behavioral: Positive for depression and substance abuse. The patient is nervous/anxious.   All other systems reviewed and are negative.   Blood pressure 134/78, pulse 91, temperature 97.6 F (36.4 C), temperature source Oral, resp. rate 20, height 5\' 8"  (1.727 m), weight 74.4 kg (164  lb), SpO2 100 %.Body mass index is 24.94 kg/m.  General Appearance: Casual paper scrubs  Eye Contact:  Fair  Speech:  Normal Rate  Volume:  Normal  Mood:  Anxious, Depressed and Irritable  Affect:  Congruent  Thought Process:  Goal Directed and Descriptions of Associations: Circumstantial  Orientation:  Full (Time, Place, and Person)  Thought Content:  Paranoid Ideation and Rumination  Suicidal Thoughts:  No  Homicidal Thoughts:  Yes.  without intent/plan   Memory:  Immediate;   Fair Recent;   Fair Remote;   Fair  Judgement:  Impaired  Insight:  Shallow  Psychomotor Activity:  Restlessness  Concentration:  Concentration: Fair  Recall:  [employer] of Knowledge:   Fair  Language:  Fair  Akathisia:  No  Handed:  Right  AIMS (if indicated):     Assets:  Desire for Improvement  ADL's:  Intact  Cognition:  WNL  Sleep:  Number of Hours: 6.75     I agree with current treatment plan on 03/08/2016, Patient seen face-to-face for psychiatric evaluation follow-up, chart reviewed. Reviewed the information documented and agree with the treatment plan.  Treatment Plan Summary: Daily contact with patient to assess and evaluate symptoms and progress in treatment and Medication management MDD (major depressive disorder), recurrent severe, without psychosis ([place]) unstable  For Depression: Prozac 20 mg po daily.  For mood sx/psychosis: continue  Haldol to 5 mg po bid. Cogentin 0.5 mg po bid for EPS.  For anxiety sx: continue  Neurontin to 300 mg po tid.  For alcohol use do: Provided substance abuse counseling. CIWA/Ativan protocol. Refer to substance abuse treatment program - he is very motivated to do so.  For insomnia: Increased Remeron to 15 mg to 30mg  po qhs.  For anxiety/agitation: PRN medications as per agitation protocol.  Reviewed past medical records,treatment plan.   Will continue to monitor vitals ,medication compliance and treatment side effects while patient is here.   Will monitor for medical issues as well as call consult as needed.   Reviewed labs ,pending hba1c, pl, tsh - wnl, lipid panel - wnl , will also order acute hep panel since AST/ALT abnormal.  I have reviewed his EKG - qtc - wnl.  CSW will continue working on disposition.   Patient to participate in therapeutic milieu   Derrill [place], NP 03/08/2016, 3:34 PM   Agree with NP note as above  Neita Garnet, MD

## 2016-03-08 NOTE — Progress Notes (Signed)
Recreation Therapy Notes  Date: 03/08/16 Time: 1000 Location: 500 Hall Dayroom  Group Topic: Coping Skills  Goal Area(s) Addresses:  Patients will be able to identify the triggers for coping skills. Patients will be able to identify coping skills for triggers. Patients will be able to identify the benefits of using coping skills post d/c.   Behavioral Response: Engaged  Intervention: Worksheet, pencils  Activity: Coping Skills Mindmap.  LRT assisted patients in filling out the center rectangle and eight (8) corresponding rectangles with triggers that initiate the need for coping skills.  Once the triggers were filled in, patients were to identify three (3) coping skills for each trigger.        Education: Radiographer, therapeutic, Dentist.   Education Outcome: Acknowledges understanding/In group clarification offered/Needs additional education.   Clinical Observations/Feedback: Pt was active and attentive.  Pt stated some of his coping skills were fishing, reading and listening to music.  Pt stated he needs to get back in AA and get a sponsor.  He also stated that he has tried AA in the past but hasn't stuck with it.    Victorino Sparrow, LRT/CTRS       Ria Comment, Sarthak Rubenstein A 03/08/2016 12:00 PM

## 2016-03-08 NOTE — BHH Group Notes (Signed)
[place] LCSW Group Therapy  03/08/2016 1:15 pm  Type of Therapy: Process Group Therapy  Participation Level:  Active  Participation Quality:  Appropriate  Affect:  Flat  Cognitive:  Oriented  Insight:  Improving  Engagement in Group:  Limited  Engagement in Therapy:  Limited  Modes of Intervention:  Activity, Clarification, Education, Problem-solving and Support  Summary of Progress/Problems: Today's group addressed the issue of overcoming obstacles.  Patients were asked to identify their biggest obstacle post d/c that stands in the way of their on-going success, and then problem solve as to how to manage this. Engaged while present.  Was on the phone with [facility] for part of group.  While there, talked about his need to quit drinking, about his otimism that going to rehab for help, and his hope that he will be able to get his own place and keep a job once he is clean and sober.  "I did it before, there' no reason I can't do it again."  Cited his mother's death 7 years ago as the thing that threw everything off.  Roque Lias B 03/08/2016   3:52 PM

## 2016-03-08 NOTE — Progress Notes (Signed)
DMeredith Leeds had been up and visible in milieu this evening, seen interacting appropriately in milieu. He has complained of on-going anxiety and depression and does report some withdrawal symptoms including agitation and anxiety and was visibly tremulous this evening. Barry Moore did receive all bedtime medications without incident and did not verbalize any complaints of pain. A. Support and encouragement provided. R. Safety maintained, will continue to monitor.

## 2016-03-08 NOTE — Tx Team (Signed)
Interdisciplinary Treatment and Diagnostic Plan Update  03/08/2016 Time of Session: 10:59 AM  Tillie Veldkamp MRN: [id number]  Principal Diagnosis: MDD (major depressive disorder), recurrent severe, without psychosis ([place])  Secondary Diagnoses: Principal Problem:   MDD (major depressive disorder), recurrent severe, without psychosis ([place]) Active Problems:   Alcohol use disorder, severe, dependence ([place])   Current Medications:  Current Facility-Administered Medications  Medication Dose Route Frequency Provider Last Rate Last Dose  . acetaminophen (TYLENOL) tablet 650 mg  650 mg Oral Q4H PRN Benjamine Mola, FNP      . alum & mag hydroxide-simeth (MAALOX/MYLANTA) 200-200-20 MG/5ML suspension 30 mL  30 mL Oral PRN Benjamine Mola, FNP      . benztropine (COGENTIN) tablet 0.5 mg  0.5 mg Oral BID Ursula Alert, MD   0.5 mg at 03/08/16 0826  . FLUoxetine (PROZAC) capsule 20 mg  20 mg Oral Daily Ursula Alert, MD   20 mg at 03/08/16 0826  . gabapentin (NEURONTIN) capsule 300 mg  300 mg Oral [id number] Ursula Alert, MD   300 mg at 03/08/16 [id number]  . haloperidol (HALDOL) tablet 5 mg  5 mg Oral BID Ursula Alert, MD   5 mg at 03/08/16 0827  . hydrOXYzine (ATARAX/VISTARIL) tablet 25 mg  25 mg Oral Q6H PRN Ursula Alert, MD      . ibuprofen (ADVIL,MOTRIN) tablet 600 mg  600 mg Oral Q8H PRN Benjamine Mola, FNP      . loperamide (IMODIUM) capsule 2-4 mg  2-4 mg Oral PRN Ursula Alert, MD      . LORazepam (ATIVAN) tablet 1 mg  1 mg Oral Q6H PRN Ursula Alert, MD   1 mg at 03/07/16 2108  . LORazepam (ATIVAN) tablet 1 mg  1 mg Oral BID Ursula Alert, MD       Followed by  . [START ON 03/10/2016] LORazepam (ATIVAN) tablet 1 mg  1 mg Oral Daily Saramma Eappen, MD      . magnesium hydroxide (MILK OF MAGNESIA) suspension 30 mL  30 mL Oral Daily PRN Benjamine Mola, FNP      . mirtazapine (REMERON) tablet 15 mg  15 mg Oral QHS Ursula Alert, MD   15 mg at 03/07/16 2108  . multivitamin with minerals  tablet 1 tablet  1 tablet Oral Daily Ursula Alert, MD   1 tablet at 03/08/16 0826  . nicotine polacrilex (NICORETTE) gum 2 mg  2 mg Oral PRN Ursula Alert, MD      . OLANZapine zydis (ZYPREXA) disintegrating tablet 5 mg  5 mg Oral TID PRN Ursula Alert, MD   5 mg at 03/07/16 1207  . ondansetron (ZOFRAN) tablet 4 mg  4 mg Oral Q8H PRN Benjamine Mola, FNP      . thiamine (VITAMIN B-1) tablet 100 mg  100 mg Oral Daily Ursula Alert, MD   100 mg at 03/08/16 [id number]    PTA Medications: Prescriptions Prior to Admission  Medication Sig Dispense Refill Last Dose  . folic acid (FOLVITE) 1 MG tablet Take 1 tablet (1 mg total) by mouth daily. (Patient not taking: Reported on 03/04/2016) 30 tablet 0 Not Taking at Unknown time  . labetalol (NORMODYNE) 100 MG tablet Take 1 tablet (100 mg total) by mouth 2 (two) times daily. (Patient not taking: Reported on 03/04/2016) 60 tablet 0 Not Taking at Unknown time  . thiamine 100 MG tablet Take 1 tablet (100 mg total) by mouth daily. (Patient not taking: Reported on 03/04/2016) 30 tablet 0 Not  Taking at Unknown time    Treatment Modalities: Medication Management, Group therapy, Case management,  1 to 1 session with clinician, Psychoeducation, Recreational therapy.   Physician Treatment Plan for Primary Diagnosis: MDD (major depressive disorder), recurrent severe, without psychosis ([place]) Long Term Goal(s): Improvement in symptoms so as ready for discharge  Short Term Goals: Ability to identify changes in lifestyle to reduce recurrence of condition will improve Ability to verbalize feelings will improve Ability to demonstrate self-control will improve Ability to maintain clinical measurements within normal limits will improve Compliance with prescribed medications will improve Ability to identify triggers associated with substance abuse/mental health issues will improve  Medication Management: Evaluate patient's response, side effects, and tolerance of  medication regimen.  Therapeutic Interventions: 1 to 1 sessions, Unit Group sessions and Medication administration.  Evaluation of Outcomes: Progressing  Physician Treatment Plan for Secondary Diagnosis: Principal Problem:   MDD (major depressive disorder), recurrent severe, without psychosis ([place]) Active Problems:   Alcohol use disorder, severe, dependence ([place])   Long Term Goal(s): Improvement in symptoms so as ready for discharge  Short Term Goals: Ability to identify changes in lifestyle to reduce recurrence of condition will improve Ability to verbalize feelings will improve Ability to demonstrate self-control will improve Ability to maintain clinical measurements within normal limits will improve Compliance with prescribed medications will improve Ability to identify triggers associated with substance abuse/mental health issues will improve  Medication Management: Evaluate patient's response, side effects, and tolerance of medication regimen.  Therapeutic Interventions: 1 to 1 sessions, Unit Group sessions and Medication administration.  Evaluation of Outcomes: Progressing   RN Treatment Plan for Primary Diagnosis: MDD (major depressive disorder), recurrent severe, without psychosis ([place]) Long Term Goal(s): Knowledge of disease and therapeutic regimen to maintain health will improve  Short Term Goals: Ability to verbalize feelings will improve and Ability to identify and develop effective coping behaviors will improve  Medication Management: RN will administer medications as ordered by provider, will assess and evaluate patient's response and provide education to patient for prescribed medication. RN will report any adverse and/or side effects to prescribing provider.  Therapeutic Interventions: 1 on 1 counseling sessions, Psychoeducation, Medication administration, Evaluate responses to treatment, Monitor vital signs and CBGs as ordered, Perform/monitor CIWA, COWS, AIMS and  Fall Risk screenings as ordered, Perform wound care treatments as ordered.  Evaluation of Outcomes: Progressing   Recreational Therapy Treatment Plan for Primary Diagnosis: MDD (major depressive disorder), recurrent severe, without psychosis ([place]) Long Term Goal(s): LTG- Patient will participate in recreation therapy tx in at least 2 group sessions without prompting from LRT.  Short Term Goals: Patient will be able to identify at least 5 coping skills for admitting dx by conclusion of recreation therapy tx.  Treatment Modalities: Group and Pet Therapy  Therapeutic Interventions: Psychoeducation  Evaluation of Outcomes: Progressing   LCSW Treatment Plan for Primary Diagnosis: MDD (major depressive disorder), recurrent severe, without psychosis ([place]) Long Term Goal(s): Safe transition to appropriate next level of care at discharge, Engage patient in therapeutic group addressing interpersonal concerns.  Short Term Goals: Engage patient in aftercare planning with referrals and resources  Therapeutic Interventions: Assess for all discharge needs, 1 to 1 time with Social worker, Explore available resources and support systems, Assess for adequacy in community support network, Educate family and significant other(s) on suicide prevention, Complete Psychosocial Assessment, Interpersonal group therapy.  Evaluation of Outcomes: Progressing   Pt states he needs help with drinking-wants to go to rehab. Has an appointment at [facility]  for Wed., phone interview with [facility] today   Progress in Treatment: Attending groups: Yes Participating in groups: Yes Taking medication as prescribed: Yes Toleration medication: Yes, no side effects reported at this time Family/Significant other contact made: No Patient understands diagnosis: Yes AEB asking for help with depression, anxiety, HI and detox Discussing patient identified problems/goals with staff: Yes Medical problems stabilized or resolved: Yes Denies  suicidal/homicidal ideation: Yes Issues/concerns per patient self-inventory: None Other: N/A  New problem(s) identified: None identified at this time.   New Short Term/Long Term Goal(s): None identified at this time.   Discharge Plan or Barriers:   Reason for Continuation of Hospitalization: Anxiety  Depression  Homicidal ideation   Medication stabilization  Withdrawal symptoms  Estimated Length of Stay: 3-5 days  Attendees: Patient: 03/08/2016  10:59 AM  Physician: Ursula Alert, MD 03/08/2016  10:59 AM  Nursing: Phillis Haggis, RN 03/08/2016  10:59 AM  RN Care Manager: Lars Pinks, RN 03/08/2016  10:59 AM  Social Worker: Ripley Fraise 03/08/2016  10:59 AM  Recreational Therapist: Umeka Wrench  03/08/2016  10:59 AM  Other: Norberto Sorenson 03/08/2016  10:59 AM  Other:  03/08/2016  10:59 AM    Scribe for Treatment Team:  Roque Lias 03/08/2016 10:59 AM

## 2016-03-08 NOTE — Progress Notes (Signed)
D: Pt A & O X 3. Denies SI, HI, AVH and pain at present. Presents with depressed affect and mood. Observed interacting well with staff and peers in milieu, though minimal; he's appropriate. Continues to report sweats, mild tremors noted in bilateral hands during CIWA assessment. Rated his anxiety, depression and hopelessness all 8/10. Reports he's sleeping well and his appetite is good.  A: Emotional support and availability provided to pt. Medications administered as prescribed. Encouraged pt to voice needs, attend groups and comply with treatment regimen. Q 15 minutes safety checks maintained on and off unit without issues.  R: Pt receptive to care. Compliant with medications. Denies adverse drug reactions. POC maintained.

## 2016-03-08 NOTE — Progress Notes (Signed)
Recreation Therapy Notes  INPATIENT RECREATION THERAPY ASSESSMENT  Patient Details Name: Barry Moore MRN: [id number] DOB: 03/12/1966 Today's Date: 03/08/2016  Patient Stressors: Other (Comment) (Homeless, no family, not knowing where next meal will come from)  Pt stated he was here for suicidal thoughts.  Coping Skills:   Isolate, Arguments, Substance Abuse, Avoidance, Self-Injury, Exercise, Music, Sports  Personal Challenges: Anger, Concentration, Decision-Making, Expressing Yourself, Relationships, Self-Esteem/Confidence, Social Interaction, Stress Management, Substance Abuse, Trusting Others  Leisure Interests (2+):  Music - Listen, Individual - Reading  Awareness of Community Resources:  Yes  Community Resources:  Park, Art therapist  Current Use: Yes  Patient Strengths:  Biomedical scientist; Communication  Patient Identified Areas of Improvement:  Get off the streets  Current Recreation Participation:  Everyday  Patient Goal for Hospitalization:  "Get my mind back in order"  [place] of Residence:  [place] of Residence:  [place]   Current SI (including self-harm):  No  Current HI:  Yes (Rated a 9 out of 10; person is not here)  Consent to Intern Participation: N/A   Victorino Sparrow, LRT/CTRS  Victorino Sparrow A 03/08/2016, 12:41 PM

## 2016-03-09 LAB — HEPATITIS PANEL, ACUTE
HCV Ab: <0.1 s/co ratio (ref 0.0–0.9)
HEP B C IGM: NEGATIVE
HEP B S AG: NEGATIVE
Hep A IgM: NEGATIVE

## 2016-03-09 MED ORDER — NICOTINE POLACRILEX 2 MG MT GUM: 2.0000 mg | CHEWING_GUM | OROMUCOSAL | 0 refills | Status: DC | PRN

## 2016-03-09 MED ORDER — BENZTROPINE MESYLATE 0.5 MG PO TABS: 0.5000 mg | ORAL_TABLET | Freq: Two times a day (BID) | ORAL | 0 refills | Status: DC

## 2016-03-09 MED ORDER — GABAPENTIN 300 MG PO CAPS: 300.0000 mg | ORAL_CAPSULE | ORAL | 0 refills | Status: DC

## 2016-03-09 MED ORDER — HALOPERIDOL 5 MG PO TABS: 5.0000 mg | ORAL_TABLET | Freq: Two times a day (BID) | ORAL | 0 refills | Status: DC

## 2016-03-09 MED ORDER — FLUOXETINE HCL 20 MG PO CAPS: 20.0000 mg | ORAL_CAPSULE | Freq: Every day | ORAL | 0 refills | Status: DC

## 2016-03-09 MED ORDER — HYDROXYZINE HCL 25 MG PO TABS: 25.0000 mg | ORAL_TABLET | Freq: Four times a day (QID) | ORAL | 0 refills | Status: DC | PRN

## 2016-03-09 MED ORDER — MIRTAZAPINE 30 MG PO TABS: 30.0000 mg | ORAL_TABLET | Freq: Every day | ORAL | 0 refills | Status: DC

## 2016-03-09 NOTE — Progress Notes (Signed)
Recreation Therapy Notes  Date: 03/09/16 Time: 1000 Location: 500 Hall Dayroom  Group Topic: Leisure Education  Goal Area(s) Addresses:  Patient will identify positive leisure activities.  Patient will identify one positive benefit of participation in leisure activities.   Behavioral Response: Engaged  Intervention: Visual merchandiser, markers, scissors, glue sticks, magazines  Activity: Leisure Programmer, systems.  Patients were to create a brochure that showed the various aspects of leisure.  The brochure could include activities from reading to traveling the world.  Patients were to highlight activities that interest them and activities the would like to learn to do or try.  Education: Leisure Education, Dentist  Education Outcome: Acknowledges education/In group clarification offered/Needs additional education  Clinical Observations/Feedback: Pt highlighted activities like music, swimming and mountain bike riding for his brochure.  Pt explained he would like to climb the [employer].   Victorino Sparrow, LRT/CTRS        Victorino Sparrow A 03/09/2016 12:01 PM

## 2016-03-09 NOTE — Progress Notes (Signed)
  [facility] Adult Case Management Discharge Plan :  Will you be returning to the same living situation after discharge:  Yes,  tent At discharge, do you have transportation home?: Yes,  bus pass Do you have the ability to pay for your medications: Yes,  mental health  Release of information consent forms completed and in the chart;  Patient's signature needed at discharge.  Patient to Follow up at: Follow-up Information    [facility] Follow up.   Specialty:  Behavioral Health Why:  Go to the walk-in clinic M-F between 8 and 11 in the next 5 days for your hospital follow up appointment Contact information: [place]  [address] [phone]           Next level of care provider has access to [place] and Suicide Prevention discussed: Yes,  yes  Have you used any form of tobacco in the last 30 days? (Cigarettes, Smokeless Tobacco, Cigars, and/or Pipes): Yes  Has patient been referred to the Quitline?: Patient refused referral  Patient has been referred for addiction treatment: Pt. refused referral  Barry Moore 03/09/2016, 10:36 AM

## 2016-03-09 NOTE — BHH Suicide Risk Assessment (Signed)
[facility] Discharge Suicide Risk Assessment   Principal Problem: MDD (major depressive disorder), recurrent severe, without psychosis ([place]) Discharge Diagnoses:  Patient Active Problem List   Diagnosis Date Noted  . MDD (major depressive disorder), recurrent severe, without psychosis ([place]) [F33.2] 03/06/2016  . Alcohol use disorder, severe, dependence ([place]) [F10.20] 02/14/2016  . Severe alcohol withdrawal without perceptual disturbances without complication ([place]) [id number] 02/14/2016  . Alcoholic hepatitis without ascites [K70.10] 02/14/2016  . Thrombocytopenia ([place]) [D69.6] 02/14/2016    Total Time spent with patient: 30 minutes  Musculoskeletal: Strength & Muscle Tone: within normal limits Gait & Station: normal Patient leans: N/A  Psychiatric Specialty Exam: Review of Systems  Psychiatric/Behavioral: Positive for substance abuse. Negative for depression. The patient is not nervous/anxious.   All other systems reviewed and are negative.   Blood pressure 122/87, pulse (!) 103, temperature 98.5 F (36.9 C), resp. rate 18, height 5\' 8"  (1.727 m), weight 74.4 kg (164 lb), SpO2 100 %.Body mass index is 24.94 kg/m.  General Appearance: Casual  Eye Contact::  Good  Speech:  Clear and Coherent409  Volume:  Normal  Mood:  Euthymic  Affect:  Appropriate  Thought Process:  Goal Directed and Descriptions of Associations: Intact  Orientation:  Full (Time, Place, and Person)  Thought Content:  WDL  Suicidal Thoughts:  No  Homicidal Thoughts:  No  Memory:  Immediate;   Fair Recent;   Fair Remote;   Fair  Judgement:  Fair  Insight:  Fair  Psychomotor Activity:  Normal  Concentration:  Fair  Recall:  [employer] of Knowledge:Fair  Language: Fair  Akathisia:  No  Handed:  Right  AIMS (if indicated):     Assets:  Communication Skills Desire for Improvement  Sleep:  Number of Hours: 6.75  Cognition: WNL  ADL's:  Intact   Mental Status Per Nursing Assessment::   On Admission:   NA  Demographic Factors:  Male  Loss Factors: NA  Historical Factors: Impulsivity  Risk Reduction Factors:   Positive coping skills or problem solving skills  Continued Clinical Symptoms:  Alcohol/Substance Abuse/Dependencies Previous Psychiatric Diagnoses and Treatments  Cognitive Features That Contribute To Risk:  None    Suicide Risk:  Minimal: No identifiable suicidal ideation.  Patients presenting with no risk factors but with morbid ruminations; may be classified as minimal risk based on the severity of the depressive symptoms  Follow-up Information    [facility] Follow up on 03/10/2016.   Why:  12/20 at 11:45 AM for your screening for admission appointment Contact information: [place] [phone]        [facility] Follow up.   Contact information: [place]  [[id number]] [place] [facility]/Follow-up recommendations:  Activity:  no restrictions Diet:  regular Tests:  as needed Other:  none  Knight Oelkers, MD 03/09/2016, 10:01 AM

## 2016-03-09 NOTE — Plan of Care (Signed)
Problem: [facility] Participation in Recreation Therapeutic Interventions Goal: STG-Patient will identify at least five coping skills for ** STG: Coping Skills - Patient will be able to identify at least 5 coping skills for suicidal thoughts by conclusion of recreation therapy tx  Outcome: Completed/Met Date Met: 03/09/16 Pt was able to identify coping skills at the conclusion of coping skills recreation therapy session.  Victorino Sparrow, LRT/CTRS

## 2016-03-09 NOTE — Progress Notes (Signed)
  D: Pt was laying down prior to the assessment. When asked if he was attending group pt stated, "I don't feel good my stomach's bothering me". When asked if he was still suicidal pt stated, "I don't want to shoot myself now". Informed the writer that when he goes to sleep he wakes up because he thinks somebody's talking to him.  Pt has no questions or concerns.    A:  Support and encouragement was offered. 15 min checks continued for safety.  R: Pt remains safe.

## 2016-03-09 NOTE — Tx Team (Signed)
Interdisciplinary Treatment and Diagnostic Plan Update  03/09/2016 Time of Session: 10:31 AM  Barry Moore MRN: [id number]  Principal Diagnosis: MDD (major depressive disorder), recurrent severe, without psychosis ([place])  Secondary Diagnoses: Principal Problem:   MDD (major depressive disorder), recurrent severe, without psychosis ([place]) Active Problems:   Alcohol use disorder, severe, dependence ([place])   Current Medications:  Current Facility-Administered Medications  Medication Dose Route Frequency Provider Last Rate Last Dose  . acetaminophen (TYLENOL) tablet 650 mg  650 mg Oral Q4H PRN Benjamine Mola, FNP      . alum & mag hydroxide-simeth (MAALOX/MYLANTA) 200-200-20 MG/5ML suspension 30 mL  30 mL Oral PRN Benjamine Mola, FNP      . benztropine (COGENTIN) tablet 0.5 mg  0.5 mg Oral BID Ursula Alert, MD   0.5 mg at 03/09/16 0808  . FLUoxetine (PROZAC) capsule 20 mg  20 mg Oral Daily Ursula Alert, MD   20 mg at 03/09/16 0808  . gabapentin (NEURONTIN) capsule 300 mg  300 mg Oral [id number] Saramma Eappen, MD   300 mg at 03/09/16 0808  . haloperidol (HALDOL) tablet 5 mg  5 mg Oral BID Ursula Alert, MD   5 mg at 03/09/16 0808  . ibuprofen (ADVIL,MOTRIN) tablet 600 mg  600 mg Oral Q8H PRN Benjamine Mola, FNP      . [START ON 03/10/2016] LORazepam (ATIVAN) tablet 1 mg  1 mg Oral Daily Saramma Eappen, MD      . magnesium hydroxide (MILK OF MAGNESIA) suspension 30 mL  30 mL Oral Daily PRN Benjamine Mola, FNP      . mirtazapine (REMERON) tablet 30 mg  30 mg Oral QHS Derrill Center, NP      . multivitamin with minerals tablet 1 tablet  1 tablet Oral Daily Ursula Alert, MD   1 tablet at 03/09/16 0808  . nicotine polacrilex (NICORETTE) gum 2 mg  2 mg Oral PRN Ursula Alert, MD      . OLANZapine zydis (ZYPREXA) disintegrating tablet 5 mg  5 mg Oral TID PRN Ursula Alert, MD   5 mg at 03/07/16 1207  . ondansetron (ZOFRAN) tablet 4 mg  4 mg Oral Q8H PRN Benjamine Mola, FNP      . thiamine  (VITAMIN B-1) tablet 100 mg  100 mg Oral Daily Ursula Alert, MD   100 mg at 03/09/16 0807    PTA Medications: Prescriptions Prior to Admission  Medication Sig Dispense Refill Last Dose  . folic acid (FOLVITE) 1 MG tablet Take 1 tablet (1 mg total) by mouth daily. (Patient not taking: Reported on 03/04/2016) 30 tablet 0 Not Taking at Unknown time  . labetalol (NORMODYNE) 100 MG tablet Take 1 tablet (100 mg total) by mouth 2 (two) times daily. (Patient not taking: Reported on 03/04/2016) 60 tablet 0 Not Taking at Unknown time  . thiamine 100 MG tablet Take 1 tablet (100 mg total) by mouth daily. (Patient not taking: Reported on 03/04/2016) 30 tablet 0 Not Taking at Unknown time    Treatment Modalities: Medication Management, Group therapy, Case management,  1 to 1 session with clinician, Psychoeducation, Recreational therapy.   Physician Treatment Plan for Primary Diagnosis: MDD (major depressive disorder), recurrent severe, without psychosis ([place]) Long Term Goal(s): Improvement in symptoms so as ready for discharge  Short Term Goals: Ability to identify changes in lifestyle to reduce recurrence of condition will improve Ability to verbalize feelings will improve Ability to demonstrate self-control will improve Ability to maintain clinical measurements  within normal limits will improve Compliance with prescribed medications will improve Ability to identify triggers associated with substance abuse/mental health issues will improve  Medication Management: Evaluate patient's response, side effects, and tolerance of medication regimen.  Therapeutic Interventions: 1 to 1 sessions, Unit Group sessions and Medication administration.  Evaluation of Outcomes: Adequate for Discharge  Physician Treatment Plan for Secondary Diagnosis: Principal Problem:   MDD (major depressive disorder), recurrent severe, without psychosis ([place]) Active Problems:   Alcohol use disorder, severe, dependence  ([place])   Long Term Goal(s): Improvement in symptoms so as ready for discharge  Short Term Goals: Ability to identify changes in lifestyle to reduce recurrence of condition will improve Ability to verbalize feelings will improve Ability to demonstrate self-control will improve Ability to maintain clinical measurements within normal limits will improve Compliance with prescribed medications will improve Ability to identify triggers associated with substance abuse/mental health issues will improve  Medication Management: Evaluate patient's response, side effects, and tolerance of medication regimen.  Therapeutic Interventions: 1 to 1 sessions, Unit Group sessions and Medication administration.  Evaluation of Outcomes: Adequate for Discharge   RN Treatment Plan for Primary Diagnosis: MDD (major depressive disorder), recurrent severe, without psychosis ([place]) Long Term Goal(s): Knowledge of disease and therapeutic regimen to maintain health will improve  Short Term Goals: Ability to verbalize feelings will improve and Ability to identify and develop effective coping behaviors will improve  Medication Management: RN will administer medications as ordered by provider, will assess and evaluate patient's response and provide education to patient for prescribed medication. RN will report any adverse and/or side effects to prescribing provider.  Therapeutic Interventions: 1 on 1 counseling sessions, Psychoeducation, Medication administration, Evaluate responses to treatment, Monitor vital signs and CBGs as ordered, Perform/monitor CIWA, COWS, AIMS and Fall Risk screenings as ordered, Perform wound care treatments as ordered.  Evaluation of Outcomes: Adequate for Discharge   Recreational Therapy Treatment Plan for Primary Diagnosis: MDD (major depressive disorder), recurrent severe, without psychosis ([place]) Long Term Goal(s): LTG- Patient will participate in recreation therapy tx in at least 2 group  sessions without prompting from LRT.  Short Term Goals: Patient will be able to identify at least 5 coping skills for admitting dx by conclusion of recreation therapy tx.  Treatment Modalities: Group and Pet Therapy  Therapeutic Interventions: Psychoeducation  Evaluation of Outcomes: Adequate for Discharge   LCSW Treatment Plan for Primary Diagnosis: MDD (major depressive disorder), recurrent severe, without psychosis ([place]) Long Term Goal(s): Safe transition to appropriate next level of care at discharge, Engage patient in therapeutic group addressing interpersonal concerns.  Short Term Goals: Engage patient in aftercare planning with referrals and resources  Therapeutic Interventions: Assess for all discharge needs, 1 to 1 time with Social worker, Explore available resources and support systems, Assess for adequacy in community support network, Educate family and significant other(s) on suicide prevention, Complete Psychosocial Assessment, Interpersonal group therapy.  Evaluation of Outcomes: Met   Pt states he needs help with drinking-wants to go to rehab. Has an appointment at [facility] for Wed., phone interview with [facility] today 12/19:  Phone interview went well.  [facility] called to offer bed today.  Pt declined.  Said he did not want to be away from sons over Christmas, and has to check on his belongings at [facility] and in the woods.  D/C to tent, follow up [facility]   Progress in Treatment: Attending groups: Yes Participating in groups: Yes Taking medication as prescribed: Yes Toleration medication: Yes, no side effects  reported at this time Family/Significant other contact made: No Patient understands diagnosis: Yes AEB asking for help with depression, anxiety, HI and detox Discussing patient identified problems/goals with staff: Yes Medical problems stabilized or resolved: Yes Denies suicidal/homicidal ideation: Yes Issues/concerns per patient self-inventory: None Other: N/A  New  problem(s) identified: None identified at this time.   New Short Term/Long Term Goal(s): None identified at this time.   Discharge Plan or Barriers:   Reason for Continuation of Hospitalization:   Estimated Length of Stay: D/C today  Attendees: Patient: 03/09/2016  10:31 AM  Physician: Ursula Alert, MD 03/09/2016  10:31 AM  Nursing: Phillis Haggis, RN 03/09/2016  10:31 AM  RN Care Manager: Lars Pinks, RN 03/09/2016  10:31 AM  Social Worker: Ripley Fraise 03/09/2016  10:31 AM  Recreational Therapist: Marjette  03/09/2016  10:31 AM  Other: Norberto Sorenson 03/09/2016  10:31 AM  Other:  03/09/2016  10:31 AM    Scribe for Treatment Team:  Roque Lias LCSW 03/09/2016 10:31 AM

## 2016-03-09 NOTE — Discharge Summary (Signed)
Physician Discharge Summary Note  Patient:  Barry Moore is an 50 y.o., male MRN:  [id number] DOB:  07/15/1965 Patient phone:  [phone] (home)  Patient address:   [place] [address],  Total Time spent with patient: 30 minutes  Date of Admission:  03/05/2016 Date of Discharge: 03/09/2016  Reason for Admission:  Suicidal ideations with plan to shoot self  Principal Problem: MDD (major depressive disorder), recurrent severe, without psychosis [facility]) Discharge Diagnoses: Patient Active Problem List   Diagnosis Date Noted  . MDD (major depressive disorder), recurrent severe, without psychosis ([place]) [F33.2] 03/06/2016  . Alcohol use disorder, severe, dependence ([place]) [F10.20] 02/14/2016  . Severe alcohol withdrawal without perceptual disturbances without complication ([place]) [id number] 02/14/2016  . Alcoholic hepatitis without ascites [K70.10] 02/14/2016  . Thrombocytopenia ([place]) [D69.6] 02/14/2016    Past Psychiatric History: see HPI  Past Medical History:  Past Medical History:  Diagnosis Date  . Hypertension    History reviewed. No pertinent surgical history. Family History:  Family History  Problem Relation Age of Onset  . Diabetes Mother   . Alcoholism Mother   . Emphysema Father   . Lung cancer Father   . Alcoholism Father    Family Psychiatric  History: see HPI Social History:  History  Alcohol Use  . Yes    Comment: [employer]      History  Drug Use No    Social History   Social History  . Marital status: Divorced    Spouse name: N/A  . Number of children: N/A  . Years of education: N/A   Social History Main Topics  . Smoking status: Current Every Day Smoker    Packs/day: 2.00    Types: Cigarettes  . Smokeless tobacco: Never Used  . Alcohol use Yes     Comment: [employer] Forty's   . Drug use: No  . Sexual activity: Not Currently   Other Topics Concern  . None   Social History Narrative  . None    Hospital Course:  Barry Elmoreis  an 50 y.o.male admitted reporting SI with a plan to shoot himself. Patient reported HI.  Patient  reported auditory and visual hallucinations. Patient  stated "I'm tired of being depressed and homeless." Patient reported ongoing alcohol abuse.  Barry Moore was admitted for MDD (major depressive disorder), recurrent severe, without psychosis ([place]) and crisis management.  Patient was treated with medications with their indications listed below in detail under Medication List.  Medical problems were identified and treated as needed.  Home medications were restarted as appropriate.  Improvement was monitored by observation and Barry Moore daily report of symptom reduction.  Emotional and mental status was monitored by daily self inventory reports completed by Barry Moore and clinical staff.  Patient reported continued improvement, denied any new concerns.  Patient had been compliant on medications and denied side effects.  Support and encouragement was provided.         Barry Moore was evaluated by the treatment team for stability and plans for continued recovery upon discharge.  Patient was offered further treatment options upon discharge including Residential, Intensive Outpatient and Outpatient treatment. Patient will follow up with agency listed below for medication management and counseling.  Encouraged patient to maintain satisfactory support network and home environment.  Advised to adhere to medication compliance and outpatient treatment follow up.  Prescriptions provided.       Barry Moore motivation was an integral factor for scheduling further treatment.  Employment, transportation, bed availability, health  status, family support, and any pending legal issues were also considered during patient's hospital stay.  Upon completion of this admission the patient was both mentally and medically stable for discharge denying suicidal/homicidal ideation, auditory/visual/tactile hallucinations,  delusional thoughts and paranoia.      Physical Findings: AIMS: Facial and Oral Movements Muscles of Facial Expression: None, normal Lips and Perioral Area: None, normal Jaw: None, normal Tongue: None, normal,Extremity Movements Upper (arms, wrists, hands, fingers): None, normal Lower (legs, knees, ankles, toes): None, normal, Trunk Movements Neck, shoulders, hips: None, normal, Overall Severity Severity of abnormal movements (highest score from questions above): None, normal Incapacitation due to abnormal movements: None, normal Patient's awareness of abnormal movements (rate only patient's report): No Awareness, Dental Status Current problems with teeth and/or dentures?: No Does patient usually wear dentures?: No  CIWA:  CIWA-Ar Total: 3 COWS:     Musculoskeletal: Strength & Muscle Tone: within normal limits Gait & Station: normal Patient leans: N/A  Psychiatric Specialty Exam: Physical Exam  Nursing note and vitals reviewed. Psychiatric: He has a normal mood and affect. His speech is normal. Judgment and thought content normal. Cognition and memory are normal.    ROS  Blood pressure 122/87, pulse (!) 103, temperature 98.5 F (36.9 C), resp. rate 18, height 5\' 8"  (1.727 m), weight 74.4 kg (164 lb), SpO2 100 %.Body mass index is 24.94 kg/m.   Have you used any form of tobacco in the last 30 days? (Cigarettes, Smokeless Tobacco, Cigars, and/or Pipes): Yes  Has this patient used any form of tobacco in the last 30 days? (Cigarettes, Smokeless Tobacco, Cigars, and/or Pipes) Yes, Rx given to patient   Blood Alcohol level:  Lab Results  Component Value Date   ETH 366 (HH) 03/04/2016   ETH 371 (HH) [id number]    Metabolic Disorder Labs:  Lab Results  Component Value Date   HGBA1C 5.2 03/07/2016   MPG 103 03/07/2016   Lab Results  Component Value Date   PROLACTIN 23.2 (H) 03/07/2016   Lab Results  Component Value Date   CHOL 215 (H) 03/07/2016   TRIG 82 03/07/2016    HDL 105 03/07/2016   CHOLHDL 2.0 03/07/2016   VLDL 16 03/07/2016   LDLCALC 94 03/07/2016    See Psychiatric Specialty Exam and Suicide Risk Assessment completed by Attending Physician prior to discharge.  Discharge destination:  Home  Is patient on multiple antipsychotic therapies at discharge:  No   Has Patient had three or more failed trials of antipsychotic monotherapy by history:  No  Recommended Plan for Multiple Antipsychotic Therapies: NA   Allergies as of 03/09/2016      Reactions   Aspirin Nausea And Vomiting      Medication List    STOP taking these medications   folic acid 1 MG tablet Commonly known as:  FOLVITE   labetalol 100 MG tablet Commonly known as:  NORMODYNE   thiamine 100 MG tablet     TAKE these medications     Indication  benztropine 0.5 MG tablet Commonly known as:  COGENTIN Take 1 tablet (0.5 mg total) by mouth 2 (two) times daily.  Indication:  Extrapyramidal Reaction caused by Medications   FLUoxetine 20 MG capsule Commonly known as:  PROZAC Take 1 capsule (20 mg total) by mouth daily. Start taking on:  03/10/2016  Indication:  Depression   gabapentin 300 MG capsule Commonly known as:  NEURONTIN Take 1 capsule (300 mg total) by mouth 3 (three) times daily at 8am, 3pm  and bedtime.  Indication:  Alcohol Withdrawal Syndrome   haloperidol 5 MG tablet Commonly known as:  HALDOL Take 1 tablet (5 mg total) by mouth 2 (two) times daily.  Indication:  Psychosis, Schizophrenia   hydrOXYzine 25 MG tablet Commonly known as:  ATARAX/VISTARIL Take 1 tablet (25 mg total) by mouth every 6 (six) hours as needed (anxiety/agitation or CIWA < or = 10).  Indication:  Anxiety Neurosis   mirtazapine 30 MG tablet Commonly known as:  REMERON Take 1 tablet (30 mg total) by mouth at bedtime.  Indication:  Trouble Sleeping   nicotine polacrilex 2 MG gum Commonly known as:  NICORETTE Take 1 each (2 mg total) by mouth as needed for smoking  cessation.  Indication:  Nicotine Addiction      Follow-up Information    [facility] Follow up.   Specialty:  Behavioral Health Why:  Go to the walk-in clinic M-F between 8 and 11 in the next 5 days for your hospital follow up appointment Contact information: [place] [address] [phone]           Follow-up recommendations:  Activity:  as tol Diet:  as tol  Comments:  1.  Take all your medications as prescribed.   2.  Report any adverse side effects to outpatient provider. 3.  Patient instructed to not use alcohol or illegal drugs while on prescription medicines. 4.  In the event of worsening symptoms, instructed patient to call 911, the crisis hotline or go to nearest emergency room for evaluation of symptoms.  Signed: Janett Labella, NP [facility] 03/09/2016, 10:42 AM

## 2016-03-09 NOTE — Progress Notes (Signed)
Patient discharged per physician order; patient denies SI/HI and A/V hallucinations; patient received prescriptions, samples, AVS, suicide risk assessment note, and transition record given to the patient after it was reviewed; patient had no other questions or concerns at this time; patient verbalized and signed that all belongings were returned; patient left the unit ambulatory 

## 2016-04-28 ENCOUNTER — Encounter (HOSPITAL_COMMUNITY): Payer: Self-pay | Admitting: Emergency Medicine

## 2016-04-28 DIAGNOSIS — Z79899 Other long term (current) drug therapy: Secondary | ICD-10-CM | POA: Insufficient documentation

## 2016-04-28 DIAGNOSIS — F17210 Nicotine dependence, cigarettes, uncomplicated: Secondary | ICD-10-CM | POA: Insufficient documentation

## 2016-04-28 DIAGNOSIS — I10 Essential (primary) hypertension: Secondary | ICD-10-CM | POA: Insufficient documentation

## 2016-04-28 DIAGNOSIS — R040 Epistaxis: Secondary | ICD-10-CM | POA: Insufficient documentation

## 2016-04-28 NOTE — ED Triage Notes (Signed)
Pt to triage by [facility] for nosebleed (bilateral nares) and headache x 2 days.  Nosebleed has currently resolved.  Pt states he is suppose to take HTN meds but doesn't.

## 2016-04-29 ENCOUNTER — Emergency Department (HOSPITAL_COMMUNITY): Payer: Self-pay

## 2016-04-29 ENCOUNTER — Emergency Department (HOSPITAL_COMMUNITY)
Admission: EM | Admit: 2016-04-29 | Discharge: 2016-04-29 | Disposition: A | Discharge status: Home / Self Care | Payer: Self-pay | Attending: Emergency Medicine | Admitting: Emergency Medicine

## 2016-04-29 DIAGNOSIS — R040 Epistaxis: Secondary | ICD-10-CM

## 2016-04-29 MED ORDER — KETOROLAC TROMETHAMINE 30 MG/ML IJ SOLN
15.0000 mg | Freq: Once | INTRAMUSCULAR | Status: AC
  Administered 2016-04-29: 15 mg via INTRAMUSCULAR
  Filled 2016-04-29: qty 1

## 2016-04-29 MED ORDER — CEPHALEXIN 500 MG PO CAPS: 500.0000 mg | ORAL_CAPSULE | Freq: Two times a day (BID) | ORAL | 0 refills | Status: DC

## 2016-04-29 MED ORDER — OXYMETAZOLINE HCL 0.05 % NA SOLN
1.0000 | Freq: Once | NASAL | Status: AC
  Administered 2016-04-29: 1 via NASAL
  Filled 2016-04-29: qty 15

## 2016-04-29 MED ORDER — DIPHENHYDRAMINE HCL 25 MG PO CAPS
25.0000 mg | ORAL_CAPSULE | Freq: Once | ORAL | Status: AC
  Administered 2016-04-29: 25 mg via ORAL
  Filled 2016-04-29: qty 1

## 2016-04-29 MED ORDER — METOCLOPRAMIDE HCL 10 MG PO TABS
10.0000 mg | ORAL_TABLET | Freq: Once | ORAL | Status: AC
  Administered 2016-04-29: 10 mg via ORAL
  Filled 2016-04-29: qty 1

## 2016-04-29 NOTE — ED Notes (Signed)
Xray tech stated that once patient stood up for xray, his nose started really bleeding again

## 2016-04-29 NOTE — Discharge Instructions (Signed)
Please read attached information. If you experience any new or worsening signs or symptoms please return to the emergency room for evaluation. Please follow-up with your primary care provider or specialist as discussed. Please use medication prescribed only as directed and discontinue taking if you have any concerning signs or symptoms.   °

## 2016-04-29 NOTE — ED Notes (Signed)
Pt returned from xray

## 2016-04-29 NOTE — ED Notes (Signed)
Pt transported to xray 

## 2016-04-29 NOTE — ED Provider Notes (Signed)
[facility] DEPT Provider Note   CSN: [id number] Arrival date & time: 04/28/16  2059     History   Chief Complaint Chief Complaint  Patient presents with  . Epistaxis    HPI Barry Moore is a 51 y.o. male.  HPI   51 year old male presents today with epistaxis. Patient reports symptoms started 3 days ago with bleeding out of the left nostril. He notes this was intermittent, with more persistent today. He notes very small amount of blood loss. Denies any trauma to the nose, no history of the same. Patient denies any abnormal bruising or bleeding, or bleeding disorders. Patient reports he's had a dry nonproductive cough, upper respiratory congestion and some runny nose. Patient denies any liver dysfunction, aspirin. Patient notes that he drinks alcohol daily. He denies use of antiplatelet or anticoagulation. Patient also notes that over the last several days he's had a frontal headache described as pressure, no associated neurological deficits, fever, neck stiffness, or any other red flags for headache.  Past Medical History:  Diagnosis Date  . Hypertension     Patient Active Problem List   Diagnosis Date Noted  . MDD (major depressive disorder), recurrent severe, without psychosis ([place]) 03/06/2016  . Alcohol use disorder, severe, dependence ([place]) 02/14/2016  . Severe alcohol withdrawal without perceptual disturbances without complication ([place]) [id number]  . Alcoholic hepatitis without ascites 02/14/2016  . Thrombocytopenia ([place]) 02/14/2016    History reviewed. No pertinent surgical history.     Home Medications    Prior to Admission medications   Medication Sig Start Date End Date Taking? Authorizing Provider  benztropine (COGENTIN) 0.5 MG tablet Take 1 tablet (0.5 mg total) by mouth 2 (two) times daily. 03/09/16   Kerrie Buffalo, NP  cephALEXin (KEFLEX) 500 MG capsule Take 1 capsule (500 mg total) by mouth 2 (two) times daily. 04/29/16   Okey Regal, PA-C    FLUoxetine (PROZAC) 20 MG capsule Take 1 capsule (20 mg total) by mouth daily. 03/10/16   Kerrie Buffalo, NP  gabapentin (NEURONTIN) 300 MG capsule Take 1 capsule (300 mg total) by mouth 3 (three) times daily at 8am, 3pm and bedtime. 03/09/16   Kerrie Buffalo, NP  haloperidol (HALDOL) 5 MG tablet Take 1 tablet (5 mg total) by mouth 2 (two) times daily. 03/09/16   Kerrie Buffalo, NP  hydrOXYzine (ATARAX/VISTARIL) 25 MG tablet Take 1 tablet (25 mg total) by mouth every 6 (six) hours as needed (anxiety/agitation or CIWA < or = 10). 03/09/16   Kerrie Buffalo, NP  mirtazapine (REMERON) 30 MG tablet Take 1 tablet (30 mg total) by mouth at bedtime. 03/09/16   Kerrie Buffalo, NP  nicotine polacrilex (NICORETTE) 2 MG gum Take 1 each (2 mg total) by mouth as needed for smoking cessation. 03/09/16   Kerrie Buffalo, NP    Family History Family History  Problem Relation Age of Onset  . Diabetes Mother   . Alcoholism Mother   . Emphysema Father   . Lung cancer Father   . Alcoholism Father     Social History Social History  Substance Use Topics  . Smoking status: Current Every Day Smoker    Packs/day: 2.00    Types: Cigarettes  . Smokeless tobacco: Never Used  . Alcohol use Yes     Comment: [employer]      Allergies   Aspirin   Review of Systems Review of Systems  All other systems reviewed and are negative.    Physical Exam Updated Vital Signs BP 143/88 (BP  Location: Right Arm)   Pulse 102   Temp 98.3 F (36.8 C) (Oral)   Resp 16   SpO2 98%   Physical Exam  Constitutional: He is oriented to person, place, and time. He appears well-developed and well-nourished.  HENT:  Head: Normocephalic and atraumatic.  Epistaxis left nares, no obvious source noted; right near clear  Eyes: Conjunctivae are normal. Pupils are equal, round, and reactive to light. Right eye exhibits no discharge. Left eye exhibits no discharge. No scleral icterus.  Neck: Normal range of motion. No JVD  present. No tracheal deviation present.  Pulmonary/Chest: Effort normal. No stridor.  Neurological: He is alert and oriented to person, place, and time. He has normal strength. No cranial nerve deficit or sensory deficit. Coordination normal. GCS eye subscore is 4. GCS verbal subscore is 5. GCS motor subscore is 6.  Psychiatric: He has a normal mood and affect. His behavior is normal. Judgment and thought content normal.  Nursing note and vitals reviewed.   ED Treatments / Results  Labs (all labs ordered are listed, but only abnormal results are displayed) Labs Reviewed - No data to display  EKG  EKG Interpretation None       Radiology Dg Chest 2 View  Result Date: 04/29/2016 CLINICAL DATA:  Cough and nose bleeds for 3 days. EXAM: CHEST  2 VIEW COMPARISON:  None. FINDINGS: Emphysematous changes in the lungs. Peribronchial thickening and central interstitial changes consistent with chronic bronchitis. Normal heart size and pulmonary vascularity. No focal airspace disease or consolidation in the lungs. No blunting of costophrenic angles. No pneumothorax. Mediastinal contours appear intact. Fracture of the midshaft left clavicle with inferior displacement of distal fracture fragment, previously seen on left shoulder radiographs from 11/21/2015. IMPRESSION: No evidence of active pulmonary disease. Emphysematous and chronic bronchitic changes in the lungs. Chronic left clavicular fracture. Electronically Signed   By: Lucienne Capers M.D.   On: 04/29/2016 03:43    Procedures Procedures (including critical care time)  Medications Ordered in ED Medications  oxymetazoline (AFRIN) 0.05 % nasal spray 1 spray (1 spray Each Nare Given 04/29/16 0319)  ketorolac (TORADOL) 30 MG/ML injection 15 mg (15 mg Intramuscular Given 04/29/16 0319)  metoCLOPramide (REGLAN) tablet 10 mg (10 mg Oral Given 04/29/16 0326)  diphenhydrAMINE (BENADRYL) capsule 25 mg (25 mg Oral Given 04/29/16 0319)     Initial  Impression / Assessment and Plan / ED Course  I have reviewed the triage vital signs and the nursing notes.  Pertinent labs & imaging results that were available during my care of the patient were reviewed by me and considered in my medical decision making (see chart for details).     Labs:  Imaging:DG chest 2 view  Consults:  Therapeutics:  Discharge Meds: Keflex  Assessment/Plan:  Patient presents with epistaxis. No obvious anterior source. Numerous attempts at hemostasis with Afrin and gauze packing. Rhino Rocket was placed which provided hemostasis patient was monitor here in the ED with no noted bleed. Patient will be placed on antibiotics prophylactically due to nasal packing. He is instructed to return the emergency room in 2 days for packing removal and reassessment, return sooner as needed.  Patient also noted to have headache here with nasal congestion and dry nonproductive cough. Patient presentation is consistent with viral URI. He has clear lung sounds, negative chest x-ray with no respiratory distress. These upper respiratory symptoms could be contributing to his nosebleed here, additionally patient is an alcoholic and drinks every day. Most recent laboratory  analysis did not show any significant derangement of his liver function that would require laboratory analysis here with given upper respiratory symptoms. No other bruising bleeding noted. Patient given strict return precautions, he verbalizes understanding and agreement to today's plan had no further questions or concerns. Discharge     Final Clinical Impressions(s) / ED Diagnoses   Final diagnoses:  Epistaxis    New Prescriptions Discharge Medication List as of 04/29/2016  5:40 AM    START taking these medications   Details  cephALEXin (KEFLEX) 500 MG capsule Take 1 capsule (500 mg total) by mouth 2 (two) times daily., Starting Thu 04/29/2016, Print         [employer], PA-C 04/29/16 [id number]    Sherwood Gambler, MD 05/03/16 [id number]

## 2016-05-02 ENCOUNTER — Emergency Department (HOSPITAL_COMMUNITY)
Admission: EM | Admit: 2016-05-02 | Discharge: 2016-05-02 | Disposition: A | Discharge status: Home / Self Care | Payer: Self-pay | Attending: Emergency Medicine | Admitting: Emergency Medicine

## 2016-05-02 ENCOUNTER — Encounter (HOSPITAL_COMMUNITY): Payer: Self-pay

## 2016-05-02 DIAGNOSIS — Z79899 Other long term (current) drug therapy: Secondary | ICD-10-CM | POA: Insufficient documentation

## 2016-05-02 DIAGNOSIS — R040 Epistaxis: Secondary | ICD-10-CM | POA: Insufficient documentation

## 2016-05-02 DIAGNOSIS — F17210 Nicotine dependence, cigarettes, uncomplicated: Secondary | ICD-10-CM | POA: Insufficient documentation

## 2016-05-02 DIAGNOSIS — I10 Essential (primary) hypertension: Secondary | ICD-10-CM | POA: Insufficient documentation

## 2016-05-02 LAB — CBC WITH DIFFERENTIAL/PLATELET
Basophils Absolute: 0 10*3/uL (ref 0.0–0.1)
Basophils Relative: 0 %
Eosinophils Absolute: 0.4 10*3/uL (ref 0.0–0.7)
Eosinophils Relative: 4 %
HCT: 33 % — ABNORMAL LOW (ref 39.0–52.0)
Hemoglobin: 11.7 g/dL — ABNORMAL LOW (ref 13.0–17.0)
Lymphocytes Relative: 19 %
Lymphs Abs: 1.7 10*3/uL (ref 0.7–4.0)
MCH: 33.4 pg (ref 26.0–34.0)
MCHC: 35.5 g/dL (ref 30.0–36.0)
MCV: 94.3 fL (ref 78.0–100.0)
Monocytes Absolute: 0.8 10*3/uL (ref 0.1–1.0)
Monocytes Relative: 9 %
Neutro Abs: 6 10*3/uL (ref 1.7–7.7)
Neutrophils Relative %: 68 %
Platelets: 144 10*3/uL — ABNORMAL LOW (ref 150–400)
RBC: 3.5 MIL/uL — ABNORMAL LOW (ref 4.22–5.81)
RDW: 13.7 % (ref 11.5–15.5)
WBC: 8.8 10*3/uL (ref 4.0–10.5)

## 2016-05-02 LAB — PROTIME-INR
INR: 1
Prothrombin Time: 13.2 seconds (ref 11.4–15.2)

## 2016-05-02 LAB — COMPREHENSIVE METABOLIC PANEL WITH GFR
ALT: 99 U/L — ABNORMAL HIGH (ref 17–63)
AST: 143 U/L — ABNORMAL HIGH (ref 15–41)
Albumin: 4 g/dL (ref 3.5–5.0)
Alkaline Phosphatase: 128 U/L — ABNORMAL HIGH (ref 38–126)
Anion gap: 12 (ref 5–15)
BUN: 10 mg/dL (ref 6–20)
CO2: 22 mmol/L (ref 22–32)
Calcium: 8.3 mg/dL — ABNORMAL LOW (ref 8.9–10.3)
Chloride: 104 mmol/L (ref 101–111)
Creatinine, Ser: 0.82 mg/dL (ref 0.61–1.24)
GFR calc Af Amer: >60 mL/min
GFR calc non Af Amer: >60 mL/min
Glucose, Bld: 121 mg/dL — ABNORMAL HIGH (ref 65–99)
Potassium: 3.5 mmol/L (ref 3.5–5.1)
Sodium: 138 mmol/L (ref 135–145)
Total Bilirubin: 0.3 mg/dL (ref 0.3–1.2)
Total Protein: 7.2 g/dL (ref 6.5–8.1)

## 2016-05-02 MED ORDER — SODIUM CHLORIDE 0.9 % IV BOLUS (SEPSIS)
1000.0000 mL | Freq: Once | INTRAVENOUS | Status: AC
  Administered 2016-05-02: 1000 mL via INTRAVENOUS

## 2016-05-02 MED ORDER — BACITRACIN ZINC 500 UNIT/GM EX OINT
TOPICAL_OINTMENT | Freq: Once | CUTANEOUS | Status: AC
  Administered 2016-05-02: 1 via TOPICAL
  Filled 2016-05-02: qty 0.9

## 2016-05-02 MED ORDER — OXYMETAZOLINE HCL 0.05 % NA SOLN
1.0000 | Freq: Once | NASAL | Status: AC
  Administered 2016-05-02: 1 via NASAL
  Filled 2016-05-02: qty 15

## 2016-05-02 NOTE — Discharge Instructions (Signed)
Please contact ENT specialist in follow-up for reassessment. Please stop using alcohol as this is likely contributing to your bleeds. Please return the emergency room if any new or worsening signs or symptoms present.

## 2016-05-02 NOTE — ED Provider Notes (Signed)
[facility] DEPT Provider Note   CSN: [id number] Arrival date & time: 05/02/16  1235  By signing my name below, I, Julien Nordmann, attest that this documentation has been prepared under the direction and in the presence of [employer], PA-C.  Electronically Signed: Julien Nordmann, ED Scribe. 05/02/16. 8:23 PM.    History   Chief Complaint Chief Complaint  Patient presents with  . Epistaxis   The history is provided by the patient. No language interpreter was used.   HPI Comments: Barry Moore is a 51 y.o. male brought in by ambulance, who has a PMhx of HTN, severe ETOH abuse and withdrawal, and thrombocytopenia presents to the Emergency Department complaining of intermittent, moderate epistaxis from both nares that began today.Patient notes majority of bleeding is out of the left nostril, intermittently has somewhat of the right. Pt was seen on 04/28/16 for moderate epistaxis of the left nare. Pt had packing placed in his nose and was prescribed Keflex but has not been taking them. Pt says he left the packing in for two days as he was advised to and once he took them out, he only had one day of no bleeding from the area. Pt was told to follow up with ENT but he has not. He is an ETOH abuser and reports drinking a 40 oz beer today. He denies fever or any other symptoms.  Past Medical History:  Diagnosis Date  . Hypertension     Patient Active Problem List   Diagnosis Date Noted  . MDD (major depressive disorder), recurrent severe, without psychosis ([place]) 03/06/2016  . Alcohol use disorder, severe, dependence ([place]) 02/14/2016  . Severe alcohol withdrawal without perceptual disturbances without complication ([place]) [id number]  . Alcoholic hepatitis without ascites 02/14/2016  . Thrombocytopenia ([place]) 02/14/2016    History reviewed. No pertinent surgical history.     Home Medications    Prior to Admission medications   Medication Sig Start Date End Date Taking?  Authorizing Provider  benztropine (COGENTIN) 0.5 MG tablet Take 1 tablet (0.5 mg total) by mouth 2 (two) times daily. 03/09/16   Kerrie Buffalo, NP  cephALEXin (KEFLEX) 500 MG capsule Take 1 capsule (500 mg total) by mouth 2 (two) times daily. 04/29/16   Okey Regal, PA-C  FLUoxetine (PROZAC) 20 MG capsule Take 1 capsule (20 mg total) by mouth daily. 03/10/16   Kerrie Buffalo, NP  gabapentin (NEURONTIN) 300 MG capsule Take 1 capsule (300 mg total) by mouth 3 (three) times daily at 8am, 3pm and bedtime. 03/09/16   Kerrie Buffalo, NP  haloperidol (HALDOL) 5 MG tablet Take 1 tablet (5 mg total) by mouth 2 (two) times daily. 03/09/16   Kerrie Buffalo, NP  hydrOXYzine (ATARAX/VISTARIL) 25 MG tablet Take 1 tablet (25 mg total) by mouth every 6 (six) hours as needed (anxiety/agitation or CIWA < or = 10). 03/09/16   Kerrie Buffalo, NP  mirtazapine (REMERON) 30 MG tablet Take 1 tablet (30 mg total) by mouth at bedtime. 03/09/16   Kerrie Buffalo, NP  nicotine polacrilex (NICORETTE) 2 MG gum Take 1 each (2 mg total) by mouth as needed for smoking cessation. 03/09/16   Kerrie Buffalo, NP    Family History Family History  Problem Relation Age of Onset  . Diabetes Mother   . Alcoholism Mother   . Emphysema Father   . Lung cancer Father   . Alcoholism Father     Social History Social History  Substance Use Topics  . Smoking status: Current Every Day Smoker  Packs/day: 2.00    Types: Cigarettes  . Smokeless tobacco: Never Used  . Alcohol use 1.2 oz/week    2 Cans of beer per week     Comment: [employer]      Allergies   Aspirin   Review of Systems Review of Systems  A complete 10 system review of systems was obtained and all systems are negative except as noted in the HPI and PMH.   Physical Exam Updated Vital Signs BP 126/82 (BP Location: Left Arm)   Pulse 89   Temp 98.2 F (36.8 C)   Resp 16   Ht 5\' 8"  (1.727 m)   Wt 80.7 kg   SpO2 96%   BMI 27.06 kg/m   Physical Exam    Constitutional: He is oriented to person, place, and time. He appears well-developed and well-nourished.  HENT:  Head: Normocephalic.  Several areas of very minor friability to the mucosa of the nares. No active bleeding noted.  Eyes: EOM are normal.  Neck: Normal range of motion.  Pulmonary/Chest: Effort normal.  Abdominal: He exhibits no distension.  Musculoskeletal: Normal range of motion.  Neurological: He is alert and oriented to person, place, and time.  Psychiatric: He has a normal mood and affect.  Nursing note and vitals reviewed.    ED Treatments / Results  DIAGNOSTIC STUDIES: Oxygen Saturation is 93% on RA, low by my interpretation.  COORDINATION OF CARE:  1:38 PM Discussed treatment plan with pt at bedside and pt agreed to plan.  Labs (all labs ordered are listed, but only abnormal results are displayed) Labs Reviewed  CBC WITH DIFFERENTIAL/PLATELET - Abnormal; Notable for the following:       Result Value   RBC 3.50 (*)    Hemoglobin 11.7 (*)    HCT 33.0 (*)    Platelets 144 (*)    All other components within normal limits  COMPREHENSIVE METABOLIC PANEL - Abnormal; Notable for the following:    Glucose, Bld 121 (*)    Calcium 8.3 (*)    AST 143 (*)    ALT 99 (*)    Alkaline Phosphatase 128 (*)    All other components within normal limits  PROTIME-INR    EKG  EKG Interpretation None       Radiology No results found.  Procedures Procedures (including critical care time)  Medications Ordered in ED Medications  sodium chloride 0.9 % bolus 1,000 mL (0 mLs Intravenous Stopped 05/02/16 1502)  oxymetazoline (AFRIN) 0.05 % nasal spray 1 spray (1 spray Each Nare Given 05/02/16 1422)  bacitracin ointment (1 application Topical Given 05/02/16 1422)     Initial Impression / Assessment and Plan / ED Course  I have reviewed the triage vital signs and the nursing notes.  Pertinent labs & imaging results that were available during my care of the patient  were reviewed by me and considered in my medical decision making (see chart for details).       Final Clinical Impressions(s) / ED Diagnoses   Final diagnoses:  Epistaxis   Labs: CMP, INR, CBC  Imaging:  Consults:  Discharge Meds:   Assessment/Plan:51 year old male presents today with epistaxis. Patient had no bleeding while here in the ED. She was monitored for several hours, due to recurrence of bleeds, ongoing alcohol use, packing was placed. Pending ENT evaluation. Patient's hemoglobin noted to be 11.7, with most recent urinalysis in December at 9 and November 13. Patient has no signs of symptomatic anemia. Due to ongoing alcohol  use labs were drawn. Patient's INR 1.0. Patient was noted to have elevation in his LFTs and alkaline phosphatase. Patient denies any abdominal pain. Patient was counseled on alcohol abuse, laboratory findings and need for follow-up evaluation. Asian notes he will follow-up with ENT, and will attempt to follow up for primary care evaluation and subsequent specialty evaluation. Patient is instructed to return to the emergency room immediately if he develops any new or worsening signs or symptoms, he verbalized understanding and agreement to today's plan had no further questions or concerns.  I personally performed the services described in this documentation, which was scribed in my presence. The recorded information has been reviewed and is accurate. New Prescriptions Discharge Medication List as of 05/02/2016  4:18 PM       Okey Regal, PA-C 05/02/16 2023    Margette Fast, MD 05/03/16 1122

## 2016-05-02 NOTE — ED Triage Notes (Signed)
PT RECEIVED FROM HOME VIA EMS C/O A NOSE BLEED TO THE EFT NARE. PER EMS, THE PT WAS GIVEN 2 SPRAYS OF AFRIN AND THE BLEEDING STOPPED. PT WAS SEEN AT [facility] 2 DAYS AGO FOR SAME AND GIVEN AFRIN AS WELL. PT ALSO C/O A HEADACHE AND NOT EATING, BUT HAS BEEN DRINKING X2 DAYS, AS WELL. HE STS HE DRANK 1 40 oz BEER TODAY.

## 2016-05-26 ENCOUNTER — Encounter (HOSPITAL_COMMUNITY): Payer: Self-pay | Admitting: Emergency Medicine

## 2016-05-26 ENCOUNTER — Emergency Department (HOSPITAL_COMMUNITY): Payer: Self-pay

## 2016-05-26 ENCOUNTER — Inpatient Hospital Stay (HOSPITAL_COMMUNITY)
Admission: EM | Admit: 2016-05-26 | Discharge: 2016-05-29 | DRG: 101 | Disposition: A | Discharge status: Home / Self Care | Payer: Self-pay | Attending: Internal Medicine | Admitting: Internal Medicine

## 2016-05-26 DIAGNOSIS — Z801 Family history of malignant neoplasm of trachea, bronchus and lung: Secondary | ICD-10-CM

## 2016-05-26 DIAGNOSIS — Z720 Tobacco use: Secondary | ICD-10-CM | POA: Diagnosis present

## 2016-05-26 DIAGNOSIS — K7010 Alcoholic hepatitis without ascites: Secondary | ICD-10-CM | POA: Diagnosis present

## 2016-05-26 DIAGNOSIS — F10239 Alcohol dependence with withdrawal, unspecified: Secondary | ICD-10-CM | POA: Diagnosis present

## 2016-05-26 DIAGNOSIS — Z5900 Homelessness unspecified: Secondary | ICD-10-CM

## 2016-05-26 DIAGNOSIS — F419 Anxiety disorder, unspecified: Secondary | ICD-10-CM | POA: Diagnosis present

## 2016-05-26 DIAGNOSIS — Z825 Family history of asthma and other chronic lower respiratory diseases: Secondary | ICD-10-CM

## 2016-05-26 DIAGNOSIS — F332 Major depressive disorder, recurrent severe without psychotic features: Secondary | ICD-10-CM | POA: Diagnosis present

## 2016-05-26 DIAGNOSIS — F10230 Alcohol dependence with withdrawal, uncomplicated: Secondary | ICD-10-CM

## 2016-05-26 DIAGNOSIS — I10 Essential (primary) hypertension: Secondary | ICD-10-CM | POA: Diagnosis present

## 2016-05-26 DIAGNOSIS — F1020 Alcohol dependence, uncomplicated: Secondary | ICD-10-CM

## 2016-05-26 DIAGNOSIS — Z833 Family history of diabetes mellitus: Secondary | ICD-10-CM

## 2016-05-26 DIAGNOSIS — Z811 Family history of alcohol abuse and dependence: Secondary | ICD-10-CM

## 2016-05-26 DIAGNOSIS — F17210 Nicotine dependence, cigarettes, uncomplicated: Secondary | ICD-10-CM | POA: Diagnosis present

## 2016-05-26 DIAGNOSIS — F1024 Alcohol dependence with alcohol-induced mood disorder: Secondary | ICD-10-CM | POA: Diagnosis present

## 2016-05-26 DIAGNOSIS — R569 Unspecified convulsions: Principal | ICD-10-CM

## 2016-05-26 HISTORY — DX: Major depressive disorder, single episode, unspecified: F32.9

## 2016-05-26 HISTORY — DX: Alcohol abuse, uncomplicated: F10.10

## 2016-05-26 HISTORY — DX: Depression, unspecified: F32.A

## 2016-05-26 HISTORY — DX: Alcoholic hepatitis without ascites: K70.10

## 2016-05-26 HISTORY — DX: Unspecified convulsions: R56.9

## 2016-05-26 LAB — COMPREHENSIVE METABOLIC PANEL
ALT: 93 U/L — ABNORMAL HIGH (ref 17–63)
AST: 188 U/L — ABNORMAL HIGH (ref 15–41)
Albumin: 4.1 g/dL (ref 3.5–5.0)
Alkaline Phosphatase: 191 U/L — ABNORMAL HIGH (ref 38–126)
Anion gap: 10 (ref 5–15)
BUN: 9 mg/dL (ref 6–20)
CO2: 27 mmol/L (ref 22–32)
Calcium: 8.8 mg/dL — ABNORMAL LOW (ref 8.9–10.3)
Chloride: 103 mmol/L (ref 101–111)
Creatinine, Ser: 0.87 mg/dL (ref 0.61–1.24)
GFR calc Af Amer: >60 mL/min (ref >60)
GFR calc non Af Amer: >60 mL/min (ref >60)
Glucose, Bld: 108 mg/dL — ABNORMAL HIGH (ref 65–99)
Potassium: 4 mmol/L (ref 3.5–5.1)
Sodium: 140 mmol/L (ref 135–145)
Total Bilirubin: 0.3 mg/dL (ref 0.3–1.2)
Total Protein: 8 g/dL (ref 6.5–8.1)

## 2016-05-26 LAB — CBC WITH DIFFERENTIAL/PLATELET
Basophils Absolute: 0.1 10*3/uL (ref 0.0–0.1)
Basophils Relative: 1 %
Eosinophils Absolute: 0.4 10*3/uL (ref 0.0–0.7)
Eosinophils Relative: 5 %
HCT: 36.9 % — ABNORMAL LOW (ref 39.0–52.0)
Hemoglobin: 12.7 g/dL — ABNORMAL LOW (ref 13.0–17.0)
Lymphocytes Relative: 17 %
Lymphs Abs: 1.6 10*3/uL (ref 0.7–4.0)
MCH: 32.2 pg (ref 26.0–34.0)
MCHC: 34.4 g/dL (ref 30.0–36.0)
MCV: 93.7 fL (ref 78.0–100.0)
Monocytes Absolute: 1.2 10*3/uL — ABNORMAL HIGH (ref 0.1–1.0)
Monocytes Relative: 13 %
Neutro Abs: 6.2 10*3/uL (ref 1.7–7.7)
Neutrophils Relative %: 66 %
Platelets: 196 10*3/uL (ref 150–400)
RBC: 3.94 MIL/uL — ABNORMAL LOW (ref 4.22–5.81)
RDW: 13.3 % (ref 11.5–15.5)
WBC: 9.5 10*3/uL (ref 4.0–10.5)

## 2016-05-26 LAB — RAPID URINE DRUG SCREEN, HOSP PERFORMED
Amphetamines: NOT DETECTED
Barbiturates: NOT DETECTED
Benzodiazepines: NOT DETECTED
Cocaine: NOT DETECTED
Opiates: NOT DETECTED
Tetrahydrocannabinol: NOT DETECTED

## 2016-05-26 LAB — SALICYLATE LEVEL: Salicylate Lvl: <7 mg/dL (ref 2.8–30.0)

## 2016-05-26 LAB — ACETAMINOPHEN LEVEL: Acetaminophen (Tylenol), Serum: <10 ug/mL — ABNORMAL LOW (ref 10–30)

## 2016-05-26 LAB — ETHANOL: Alcohol, Ethyl (B): 403 mg/dL (ref <5)

## 2016-05-26 MED ORDER — ONDANSETRON HCL 4 MG/2ML IJ SOLN: 4.0000 mg | Freq: Four times a day (QID) | INTRAMUSCULAR | Status: DC | PRN

## 2016-05-26 MED ORDER — LORAZEPAM 2 MG/ML IJ SOLN
0.0000 mg | Freq: Two times a day (BID) | INTRAMUSCULAR | Status: DC
  Filled 2016-05-26: qty 1

## 2016-05-26 MED ORDER — LORAZEPAM 2 MG/ML IJ SOLN
1.0000 mg | Freq: Four times a day (QID) | INTRAMUSCULAR | Status: DC | PRN
  Administered 2016-05-27 – 2016-05-28 (×3): 1 mg via INTRAVENOUS
  Filled 2016-05-26 (×2): qty 1

## 2016-05-26 MED ORDER — THIAMINE HCL 100 MG/ML IJ SOLN: 100.0000 mg | Freq: Every day | INTRAMUSCULAR | Status: DC

## 2016-05-26 MED ORDER — CHLORDIAZEPOXIDE HCL 5 MG PO CAPS: 10.0000 mg | ORAL_CAPSULE | Freq: Once | ORAL | Status: DC

## 2016-05-26 MED ORDER — PANTOPRAZOLE SODIUM 40 MG PO TBEC
40.0000 mg | DELAYED_RELEASE_TABLET | Freq: Every day | ORAL | Status: DC
  Administered 2016-05-27 – 2016-05-28 (×2): 40 mg via ORAL
  Filled 2016-05-26 (×2): qty 1

## 2016-05-26 MED ORDER — CHLORDIAZEPOXIDE HCL 5 MG PO CAPS
5.0000 mg | ORAL_CAPSULE | Freq: Three times a day (TID) | ORAL | Status: DC
  Administered 2016-05-26 – 2016-05-29 (×8): 5 mg via ORAL
  Filled 2016-05-26 (×8): qty 1

## 2016-05-26 MED ORDER — SODIUM CHLORIDE 0.9% FLUSH
3.0000 mL | Freq: Two times a day (BID) | INTRAVENOUS | Status: DC
  Administered 2016-05-27 (×2): 3 mL via INTRAVENOUS

## 2016-05-26 MED ORDER — LORAZEPAM 1 MG PO TABS: 1.0000 mg | ORAL_TABLET | Freq: Four times a day (QID) | ORAL | Status: DC | PRN

## 2016-05-26 MED ORDER — LORAZEPAM 2 MG/ML IJ SOLN: 0.0000 mg | Freq: Two times a day (BID) | INTRAMUSCULAR | Status: DC

## 2016-05-26 MED ORDER — ENOXAPARIN SODIUM 40 MG/0.4ML ~~LOC~~ SOLN
40.0000 mg | SUBCUTANEOUS | Status: DC
  Administered 2016-05-26 – 2016-05-28 (×3): 40 mg via SUBCUTANEOUS
  Filled 2016-05-26 (×3): qty 0.4

## 2016-05-26 MED ORDER — LORAZEPAM 2 MG/ML IJ SOLN
0.0000 mg | Freq: Four times a day (QID) | INTRAMUSCULAR | Status: DC
  Administered 2016-05-26: 2 mg via INTRAVENOUS
  Filled 2016-05-26: qty 1

## 2016-05-26 MED ORDER — ADULT MULTIVITAMIN W/MINERALS CH
1.0000 | ORAL_TABLET | Freq: Every day | ORAL | Status: DC
  Administered 2016-05-27 – 2016-05-29 (×3): 1 via ORAL
  Filled 2016-05-26 (×3): qty 1

## 2016-05-26 MED ORDER — FOLIC ACID 1 MG PO TABS
1.0000 mg | ORAL_TABLET | Freq: Every day | ORAL | Status: DC
  Administered 2016-05-27 – 2016-05-29 (×3): 1 mg via ORAL
  Filled 2016-05-26 (×3): qty 1

## 2016-05-26 MED ORDER — FLUOXETINE HCL 20 MG PO CAPS
20.0000 mg | ORAL_CAPSULE | Freq: Every day | ORAL | Status: DC
  Administered 2016-05-27 – 2016-05-29 (×3): 20 mg via ORAL
  Filled 2016-05-26 (×3): qty 1

## 2016-05-26 MED ORDER — NICOTINE 21 MG/24HR TD PT24
21.0000 mg | MEDICATED_PATCH | Freq: Every day | TRANSDERMAL | Status: DC
  Administered 2016-05-27 – 2016-05-28 (×2): 21 mg via TRANSDERMAL
  Filled 2016-05-26 (×3): qty 1

## 2016-05-26 MED ORDER — ONDANSETRON HCL 4 MG PO TABS: 4.0000 mg | ORAL_TABLET | Freq: Four times a day (QID) | ORAL | Status: DC | PRN

## 2016-05-26 MED ORDER — VITAMIN B-1 100 MG PO TABS
100.0000 mg | ORAL_TABLET | Freq: Every day | ORAL | Status: DC
  Administered 2016-05-27 – 2016-05-29 (×3): 100 mg via ORAL
  Filled 2016-05-26 (×3): qty 1

## 2016-05-26 MED ORDER — SODIUM CHLORIDE 0.9 % IV SOLN
INTRAVENOUS | Status: DC
  Administered 2016-05-26 – 2016-05-29 (×5): via INTRAVENOUS

## 2016-05-26 MED ORDER — LORAZEPAM 2 MG/ML IJ SOLN
0.0000 mg | Freq: Four times a day (QID) | INTRAMUSCULAR | Status: AC
  Administered 2016-05-26 – 2016-05-27 (×4): 2 mg via INTRAVENOUS
  Administered 2016-05-27: 4 mg via INTRAVENOUS
  Administered 2016-05-28 (×2): 2 mg via INTRAVENOUS
  Filled 2016-05-26 (×4): qty 1
  Filled 2016-05-26: qty 2
  Filled 2016-05-26 (×2): qty 1

## 2016-05-26 NOTE — ED Provider Notes (Signed)
[place] DEPT Provider Note   CSN: [id number] Arrival date & time: 05/26/16  1707     History   Chief Complaint Chief Complaint  Patient presents with  . Seizures    HPI Barry Moore is a 51 y.o. male with hx of EtOH abuse presents to ED today with history of seizures. He states he attempted to cut back on his drinking beginning yesterday and has seized 9 times today with seizures lasting up to 4 minutes in duration as per pt's friend. He states he typically drinks "2 fifths and 2-3 40s" and since yesterday has only had 1 70ml bottle of malt liquor. He also developed b/l hand tremor today, as well as frontal HA. He is unsure if he hit his head or bit his tongue during his seizures but his friend states he does not believe so. Does endorse some difficulty walking but no recent falls. Also endorses diarrhea, nausea, lightheadedness, agitation, and  increasing anxiety.   Denies bowel/bladder incontinence, syncope, fever/chills, abd pain, melena, dysuria, hematuria.   The history is provided by the patient and a friend.    Past Medical History:  Diagnosis Date  . Hypertension   . Seizures [facility])     Patient Active Problem List   Diagnosis Date Noted  . Seizure ([place]) 05/26/2016  . Tobacco abuse 05/26/2016  . Homeless 05/26/2016  . MDD (major depressive disorder), recurrent severe, without psychosis ([place]) 03/06/2016  . Alcohol use disorder, severe, dependence ([place]) 02/14/2016  . Severe alcohol withdrawal without perceptual disturbances without complication ([place]) 32/44/0102  . Alcoholic hepatitis without ascites 02/14/2016  . Thrombocytopenia ([place]) 02/14/2016    Past Surgical History:  Procedure Laterality Date  . HERNIA REPAIR    . LEG SURGERY         Home Medications    Prior to Admission medications   Medication Sig Start Date End Date Taking? Authorizing Provider  benztropine (COGENTIN) 0.5 MG tablet Take 1 tablet (0.5 mg total) by mouth 2 (two) times  daily. Patient not taking: Reported on 05/26/2016 03/09/16   Kerrie Buffalo, NP  cephALEXin (KEFLEX) 500 MG capsule Take 1 capsule (500 mg total) by mouth 2 (two) times daily. Patient not taking: Reported on 05/26/2016 04/29/16   Okey Regal, PA-C  FLUoxetine (PROZAC) 20 MG capsule Take 1 capsule (20 mg total) by mouth daily. Patient not taking: Reported on 05/26/2016 03/10/16   Kerrie Buffalo, NP  gabapentin (NEURONTIN) 300 MG capsule Take 1 capsule (300 mg total) by mouth 3 (three) times daily at 8am, 3pm and bedtime. Patient not taking: Reported on 05/26/2016 03/09/16   Kerrie Buffalo, NP  haloperidol (HALDOL) 5 MG tablet Take 1 tablet (5 mg total) by mouth 2 (two) times daily. Patient not taking: Reported on 05/26/2016 03/09/16   Kerrie Buffalo, NP  hydrOXYzine (ATARAX/VISTARIL) 25 MG tablet Take 1 tablet (25 mg total) by mouth every 6 (six) hours as needed (anxiety/agitation or CIWA < or = 10). Patient not taking: Reported on 05/26/2016 03/09/16   Kerrie Buffalo, NP  mirtazapine (REMERON) 30 MG tablet Take 1 tablet (30 mg total) by mouth at bedtime. Patient not taking: Reported on 05/26/2016 03/09/16   Kerrie Buffalo, NP  nicotine polacrilex (NICORETTE) 2 MG gum Take 1 each (2 mg total) by mouth as needed for smoking cessation. Patient not taking: Reported on 05/26/2016 03/09/16   Kerrie Buffalo, NP    Family History Family History  Problem Relation Age of Onset  . Diabetes Mother   . Alcoholism Mother   .  Emphysema Father   . Lung cancer Father   . Alcoholism Father     Social History Social History  Substance Use Topics  . Smoking status: Current Every Day Smoker    Packs/day: 2.00    Types: Cigarettes  . Smokeless tobacco: Never Used  . Alcohol use Yes     Comment: 5th liquor and one Forty daily       Allergies   Aspirin   Review of Systems Review of Systems  Constitutional: Negative for chills and fever.  HENT: Negative for hearing loss and tinnitus.   Eyes: Negative for  discharge and redness.  Respiratory: Negative for shortness of breath.   Cardiovascular: Negative for chest pain and palpitations.  Gastrointestinal: Positive for diarrhea and nausea. Negative for abdominal distention, abdominal pain and vomiting.  Genitourinary: Negative for dysuria and hematuria.  Musculoskeletal: Negative for arthralgias and myalgias.  Neurological: Positive for tremors, seizures, light-headedness and headaches. Negative for syncope.  Psychiatric/Behavioral: Positive for agitation and confusion.     Physical Exam Updated Vital Signs BP 140/96   Pulse 112   Temp 98.3 F (36.8 C) (Oral)   Resp 24   SpO2 97%   Physical Exam  Constitutional:  Anxious appearing, thin, with dirty clothing and nails, unkempt beard  HENT:  Head: Normocephalic and atraumatic.  Right Ear: External ear normal.  Left Ear: External ear normal.  Eyes: Conjunctivae and EOM are normal. Pupils are equal, round, and reactive to light. Right eye exhibits no discharge. Left eye exhibits no discharge. No scleral icterus.  Neck: Normal range of motion. Neck supple. No JVD present. No tracheal deviation present.  Cardiovascular: Normal rate, regular rhythm, normal heart sounds and intact distal pulses.   Pulmonary/Chest: Effort normal.  Globally diminished breath sounds   Abdominal: Soft. Bowel sounds are normal. He exhibits no distension. There is no tenderness.  Musculoskeletal: He exhibits tenderness.  Limited ROM of Ue, ttp of midline at C7 protuberance   Lymphadenopathy:    He has no cervical adenopathy.  Neurological: He is alert.  Oriented to person and place only  Essential tremor of hands noted b/l 5/5 strength in UE, 4/5 strength in LE (pt states this is baseline), sensation intact in all four extremities  Skin: Skin is warm and dry.  Psychiatric: His behavior is normal. Judgment and thought content normal.  Anxious-appearing     ED Treatments / Results  Labs (all labs  ordered are listed, but only abnormal results are displayed) Labs Reviewed  CBC WITH DIFFERENTIAL/PLATELET - Abnormal; Notable for the following:       Result Value   RBC 3.94 (*)    Hemoglobin 12.7 (*)    HCT 36.9 (*)    Monocytes Absolute 1.2 (*)    All other components within normal limits  COMPREHENSIVE METABOLIC PANEL - Abnormal; Notable for the following:    Glucose, Bld 108 (*)    Calcium 8.8 (*)    AST 188 (*)    ALT 93 (*)    Alkaline Phosphatase 191 (*)    All other components within normal limits  ETHANOL - Abnormal; Notable for the following:    Alcohol, Ethyl (B) 403 (*)    All other components within normal limits  ACETAMINOPHEN LEVEL - Abnormal; Notable for the following:    Acetaminophen (Tylenol), Serum <10 (*)    All other components within normal limits  RAPID URINE DRUG SCREEN, HOSP PERFORMED  SALICYLATE LEVEL  COMPREHENSIVE METABOLIC PANEL  CBC  EKG  EKG Interpretation None       Radiology Ct Head Wo Contrast  Result Date: 05/26/2016 CLINICAL DATA:  Witnessed seizure.  Altered mental status. EXAM: CT HEAD WITHOUT CONTRAST CT CERVICAL SPINE WITHOUT CONTRAST TECHNIQUE: Multidetector CT imaging of the head and cervical spine was performed following the standard protocol without intravenous contrast. Multiplanar CT image reconstructions of the cervical spine were also generated. COMPARISON:  CT HEAD AND CERVICAL SPINE 11/21/2015. FINDINGS: CT HEAD FINDINGS Brain: A mild atrophy and white matter changes are advanced for age, but stable no acute infarct, hemorrhage, or mass lesion is present. The ventricles are of normal size. No significant extraaxial fluid collection is present. Cerebellar atrophy is noted. Vascular: Atherosclerotic calcifications are present within the cavernous internal carotid arteries. There is no hyperdense vessel. Skull: The calvarium is within normal limits. No focal lytic or blastic lesions are present. Sinuses/Orbits: The paranasal  sinuses and mastoid air cells are clear. Debris is present within the external auditory canals bilaterally. Globes and orbits are within normal limits. CT CERVICAL SPINE FINDINGS Alignment: AP alignment is anatomic. Rightward curvature is likely positional. Skull base and vertebrae: The craniocervical junction is normal. Vertebral body heights are maintained. No acute fracture or traumatic subluxation is present. Soft tissues and spinal canal: The soft tissues the neck are unremarkable. No significant intracanalicular lesion is evident. Disc levels:  No focal stenosis is evident. Upper chest: The lung apices are clear. IMPRESSION: 1. No acute intracranial abnormality or significant interval change. 2. Mild age advanced atrophy and white matter disease including cerebellar atrophy. 3. No acute fracture or traumatic subluxation within the cervical spine. Electronically Signed   By: San Morelle M.D.   On: 05/26/2016 18:51   Ct Cervical Spine Wo Contrast  Result Date: 05/26/2016 CLINICAL DATA:  Witnessed seizure.  Altered mental status. EXAM: CT HEAD WITHOUT CONTRAST CT CERVICAL SPINE WITHOUT CONTRAST TECHNIQUE: Multidetector CT imaging of the head and cervical spine was performed following the standard protocol without intravenous contrast. Multiplanar CT image reconstructions of the cervical spine were also generated. COMPARISON:  CT HEAD AND CERVICAL SPINE 11/21/2015. FINDINGS: CT HEAD FINDINGS Brain: A mild atrophy and white matter changes are advanced for age, but stable no acute infarct, hemorrhage, or mass lesion is present. The ventricles are of normal size. No significant extraaxial fluid collection is present. Cerebellar atrophy is noted. Vascular: Atherosclerotic calcifications are present within the cavernous internal carotid arteries. There is no hyperdense vessel. Skull: The calvarium is within normal limits. No focal lytic or blastic lesions are present. Sinuses/Orbits: The paranasal sinuses  and mastoid air cells are clear. Debris is present within the external auditory canals bilaterally. Globes and orbits are within normal limits. CT CERVICAL SPINE FINDINGS Alignment: AP alignment is anatomic. Rightward curvature is likely positional. Skull base and vertebrae: The craniocervical junction is normal. Vertebral body heights are maintained. No acute fracture or traumatic subluxation is present. Soft tissues and spinal canal: The soft tissues the neck are unremarkable. No significant intracanalicular lesion is evident. Disc levels:  No focal stenosis is evident. Upper chest: The lung apices are clear. IMPRESSION: 1. No acute intracranial abnormality or significant interval change. 2. Mild age advanced atrophy and white matter disease including cerebellar atrophy. 3. No acute fracture or traumatic subluxation within the cervical spine. Electronically Signed   By: San Morelle M.D.   On: 05/26/2016 18:51    Procedures Procedures (including critical care time)  Medications Ordered in ED Medications  chlordiazePOXIDE (LIBRIUM) capsule  10 mg (not administered)  pantoprazole (PROTONIX) EC tablet 40 mg (not administered)  nicotine (NICODERM CQ - dosed in mg/24 hours) patch 21 mg (not administered)  0.9 %  sodium chloride infusion (not administered)  LORazepam (ATIVAN) tablet 1 mg (not administered)    Or  LORazepam (ATIVAN) injection 1 mg (not administered)  thiamine (VITAMIN B-1) tablet 100 mg (not administered)    Or  thiamine (B-1) injection 100 mg (not administered)  folic acid (FOLVITE) tablet 1 mg (not administered)  multivitamin with minerals tablet 1 tablet (not administered)  LORazepam (ATIVAN) injection 0-4 mg (not administered)    Followed by  LORazepam (ATIVAN) injection 0-4 mg (not administered)  enoxaparin (LOVENOX) injection 40 mg (not administered)  sodium chloride flush (NS) 0.9 % injection 3 mL (not administered)  ondansetron (ZOFRAN) tablet 4 mg (not  administered)    Or  ondansetron (ZOFRAN) injection 4 mg (not administered)  FLUoxetine (PROZAC) capsule 20 mg (not administered)     Initial Impression / Assessment and Plan / ED Course  I have reviewed the triage vital signs and the nursing notes.  Pertinent labs & imaging results that were available during my care of the patient were reviewed by me and considered in my medical decision making (see chart for details).     50yom presents to ED with chief complaint of multiple seizures 2/2 precipitous decrease in alcohol intake. Multiple seizures reported, with associated hand tremor, HA, and nausea/diarrhea.  Question if true seizure due to friend's description of seizures, but definitely tremulous. Pt unsure if he hit his head while seizing but endorses mild neck pain. CT of head and CSP show no acute processes. Labs positive for ethanol, negative for salicylate and ibuprofen toxicity. Likely in alcohol withdrawal. Initiated CIWA protocol; CIWA score 19. Will bring pt into hospital for monitoring and management of EtOH withdrawal.   Final Clinical Impressions(s) / ED Diagnoses   Final diagnoses:  Alcohol withdrawal seizure without complication [facility])    New Prescriptions New Prescriptions   No medications on file     Renita Papa, [place] 05/26/16 [place], MD 05/27/16 [phone]

## 2016-05-26 NOTE — H&P (Signed)
History and Physical    Barry Moore WGY:[id number] DOB: [birth year] DOA: 05/26/2016  Referring MD/NP/PA:   PCP: No PCP Per Patient   Patient coming from:  The patient is coming from home.  At baseline, pt is independent for most of ADL. SNF  Assistant living facility   Retirement center.      Chief Complaint: seizure  HPI: Barry Moore is a 51 y.o. male with medical history significant of alcohol abuse, alcoholic hepatitis, seizure, tobacco abuse, homelessness, depression, hypertension, who presents with seizure.  Pt states he typically drinks "2 fifths and 2-3 40s" and since yesterday has only had 1 62ml bottle of malt liquor since he attempted to cut back on his drinking. Per his friend, pt may have had seized 9 times today, with seizures lasting up to 4 minutes in duration. He also has hand tremors bilaterally today. He is unsure if he hit his head or bit his tongue during his seizures but his friend states he does not believe so. Pt has some difficulty walking, but denies fall, unilateral weakness, difficulty speaking, vision change or hearing loss. He states that he has nausea and loose stool sometimes recently. He also has lightheadedness, agitation and  increasing anxiety. Patient denies chest pain, cough, shortness of breath, abdominal pain or symptoms of UTI.  ED Course: pt was found to have alcohol level was 403, negative UDS, Tylenol level less than 10, salicylate level less than 7, WBC 9.5, creatinine normal electrolytes renal function okay, temperature normal, tachycardia, tachypnea, oxygen saturation 93% on room air. CT head and CT of C-spine negative for acute issues. Pt is placed on tele bed for obs.  Review of Systems:   General: no fevers, chills, no changes in body weight, has poor appetite, has fatigue HEENT: no blurry vision, hearing changes or sore throat Respiratory: no dyspnea, coughing, wheezing CV: no chest pain, no palpitations GI: has and loose stool. No vomiting,  abdominal pain, constipation GU: no dysuria, burning on urination, increased urinary frequency, hematuria  Ext: no leg edema Neuro: no unilateral weakness, numbness, or tingling, no vision change or hearing loss Skin: no rash, no skin tear. MSK: No muscle spasm, no deformity, no limitation of range of movement in spin Heme: No easy bruising.  Travel history: No recent long distant travel.  Allergy:  Allergies  Allergen Reactions  . Aspirin Nausea And Vomiting    Past Medical History:  Diagnosis Date  . Hypertension   . Seizures ([place])     Past Surgical History:  Procedure Laterality Date  . HERNIA REPAIR    . LEG SURGERY      Social History:  reports that he has been smoking Cigarettes.  He has been smoking about 2.00 packs per day. He has never used smokeless tobacco. He reports that he drinks alcohol. He reports that he does not use drugs.  Family History:  Family History  Problem Relation Age of Onset  . Diabetes Mother   . Alcoholism Mother   . Emphysema Father   . Lung cancer Father   . Alcoholism Father      Prior to Admission medications   Medication Sig Start Date End Date Taking? Authorizing Provider  benztropine (COGENTIN) 0.5 MG tablet Take 1 tablet (0.5 mg total) by mouth 2 (two) times daily. Patient not taking: Reported on 05/26/2016 03/09/16   Kerrie Buffalo, NP  cephALEXin (KEFLEX) 500 MG capsule Take 1 capsule (500 mg total) by mouth 2 (two) times daily. Patient not taking: Reported  on 05/26/2016 04/29/16   Okey Regal, PA-C  FLUoxetine (PROZAC) 20 MG capsule Take 1 capsule (20 mg total) by mouth daily. Patient not taking: Reported on 05/26/2016 03/10/16   Kerrie Buffalo, NP  gabapentin (NEURONTIN) 300 MG capsule Take 1 capsule (300 mg total) by mouth 3 (three) times daily at 8am, 3pm and bedtime. Patient not taking: Reported on 05/26/2016 03/09/16   Kerrie Buffalo, NP  haloperidol (HALDOL) 5 MG tablet Take 1 tablet (5 mg total) by mouth 2 (two) times  daily. Patient not taking: Reported on 05/26/2016 03/09/16   Kerrie Buffalo, NP  hydrOXYzine (ATARAX/VISTARIL) 25 MG tablet Take 1 tablet (25 mg total) by mouth every 6 (six) hours as needed (anxiety/agitation or CIWA < or = 10). Patient not taking: Reported on 05/26/2016 03/09/16   Kerrie Buffalo, NP  mirtazapine (REMERON) 30 MG tablet Take 1 tablet (30 mg total) by mouth at bedtime. Patient not taking: Reported on 05/26/2016 03/09/16   Kerrie Buffalo, NP  nicotine polacrilex (NICORETTE) 2 MG gum Take 1 each (2 mg total) by mouth as needed for smoking cessation. Patient not taking: Reported on 05/26/2016 03/09/16   Kerrie Buffalo, NP    Physical Exam: Vitals:   05/26/16 1745 05/26/16 1800 05/26/16 1830 05/26/16 2000  BP:  140/96  112/74  Pulse: 103 103 112 111  Resp: 24 16 24 24   Temp:      TempSrc:      SpO2: 95% 97% 97% 94%   General: Not in acute distress. Pt is anxious and tremulous. HEENT:       Eyes: PERRL, EOMI, no scleral icterus.       ENT: No discharge from the ears and nose, no pharynx injection, no tonsillar enlargement.        Neck: No JVD, no bruit, no mass felt. Heme: No neck lymph node enlargement. Cardiac: S1/S2, RRR, No murmurs, No gallops or rubs. Respiratory: No rales, wheezing, rhonchi or rubs. GI: Soft, nondistended, nontender, no rebound pain, no organomegaly, BS present. GU: No hematuria Ext: No pitting leg edema bilaterally. 2+DP/PT pulse bilaterally. Musculoskeletal: No joint deformities, No joint redness or warmth, no limitation of ROM in spin. Skin: No rashes.  Neuro: drowsy, oriented X3, cranial nerves II-XII grossly intact, moves all extremities normally.  Psych: Patient is not psychotic, no suicidal or hemocidal ideation.  Labs on Admission: I have personally reviewed following labs and imaging studies  CBC:  Recent Labs Lab 05/26/16 1832  WBC 9.5  NEUTROABS 6.2  HGB 12.7*  HCT 36.9*  MCV 93.7  PLT 703   Basic Metabolic Panel:  Recent  Labs Lab 05/26/16 1832  NA 140  K 4.0  CL 103  CO2 27  GLUCOSE 108*  BUN 9  CREATININE 0.87  CALCIUM 8.8*   GFR: CrCl cannot be calculated (Unknown ideal weight.). Liver Function Tests:  Recent Labs Lab 05/26/16 1832  AST 188*  ALT 93*  ALKPHOS 191*  BILITOT 0.3  PROT 8.0  ALBUMIN 4.1   No results for input(s): LIPASE, AMYLASE in the last 168 hours. No results for input(s): AMMONIA in the last 168 hours. Coagulation Profile: No results for input(s): INR, PROTIME in the last 168 hours. Cardiac Enzymes: No results for input(s): CKTOTAL, CKMB, CKMBINDEX, TROPONINI in the last 168 hours. BNP (last 3 results) No results for input(s): PROBNP in the last 8760 hours. HbA1C: No results for input(s): HGBA1C in the last 72 hours. CBG: No results for input(s): GLUCAP in the last 168 hours. Lipid  Profile: No results for input(s): CHOL, HDL, LDLCALC, TRIG, CHOLHDL, LDLDIRECT in the last 72 hours. Thyroid Function Tests: No results for input(s): TSH, T4TOTAL, FREET4, T3FREE, THYROIDAB in the last 72 hours. Anemia Panel: No results for input(s): VITAMINB12, FOLATE, FERRITIN, TIBC, IRON, RETICCTPCT in the last 72 hours. Urine analysis:    Component Value Date/Time   COLORURINE COLORLESS (A) 03/04/2016 1600   APPEARANCEUR CLEAR 03/04/2016 1600   LABSPEC 1.003 (L) 03/04/2016 1600   PHURINE 5.0 03/04/2016 1600   GLUCOSEU NEGATIVE 03/04/2016 1600   HGBUR NEGATIVE 03/04/2016 1600   BILIRUBINUR NEGATIVE 03/04/2016 1600   KETONESUR NEGATIVE 03/04/2016 1600   PROTEINUR NEGATIVE 03/04/2016 1600   NITRITE NEGATIVE 03/04/2016 1600   LEUKOCYTESUR NEGATIVE 03/04/2016 1600   Sepsis Labs: @LABRCNTIP (procalcitonin:4,lacticidven:4) )No results found for this or any previous visit (from the past 240 hour(s)).   Radiological Exams on Admission: Ct Head Wo Contrast  Result Date: 05/26/2016 CLINICAL DATA:  Witnessed seizure.  Altered mental status. EXAM: CT HEAD WITHOUT CONTRAST CT  CERVICAL SPINE WITHOUT CONTRAST TECHNIQUE: Multidetector CT imaging of the head and cervical spine was performed following the standard protocol without intravenous contrast. Multiplanar CT image reconstructions of the cervical spine were also generated. COMPARISON:  CT HEAD AND CERVICAL SPINE 11/21/2015. FINDINGS: CT HEAD FINDINGS Brain: A mild atrophy and white matter changes are advanced for age, but stable no acute infarct, hemorrhage, or mass lesion is present. The ventricles are of normal size. No significant extraaxial fluid collection is present. Cerebellar atrophy is noted. Vascular: Atherosclerotic calcifications are present within the cavernous internal carotid arteries. There is no hyperdense vessel. Skull: The calvarium is within normal limits. No focal lytic or blastic lesions are present. Sinuses/Orbits: The paranasal sinuses and mastoid air cells are clear. Debris is present within the external auditory canals bilaterally. Globes and orbits are within normal limits. CT CERVICAL SPINE FINDINGS Alignment: AP alignment is anatomic. Rightward curvature is likely positional. Skull base and vertebrae: The craniocervical junction is normal. Vertebral body heights are maintained. No acute fracture or traumatic subluxation is present. Soft tissues and spinal canal: The soft tissues the neck are unremarkable. No significant intracanalicular lesion is evident. Disc levels:  No focal stenosis is evident. Upper chest: The lung apices are clear. IMPRESSION: 1. No acute intracranial abnormality or significant interval change. 2. Mild age advanced atrophy and white matter disease including cerebellar atrophy. 3. No acute fracture or traumatic subluxation within the cervical spine. Electronically Signed   By: San Morelle M.D.   On: 05/26/2016 18:51   Ct Cervical Spine Wo Contrast  Result Date: 05/26/2016 CLINICAL DATA:  Witnessed seizure.  Altered mental status. EXAM: CT HEAD WITHOUT CONTRAST CT CERVICAL  SPINE WITHOUT CONTRAST TECHNIQUE: Multidetector CT imaging of the head and cervical spine was performed following the standard protocol without intravenous contrast. Multiplanar CT image reconstructions of the cervical spine were also generated. COMPARISON:  CT HEAD AND CERVICAL SPINE 11/21/2015. FINDINGS: CT HEAD FINDINGS Brain: A mild atrophy and white matter changes are advanced for age, but stable no acute infarct, hemorrhage, or mass lesion is present. The ventricles are of normal size. No significant extraaxial fluid collection is present. Cerebellar atrophy is noted. Vascular: Atherosclerotic calcifications are present within the cavernous internal carotid arteries. There is no hyperdense vessel. Skull: The calvarium is within normal limits. No focal lytic or blastic lesions are present. Sinuses/Orbits: The paranasal sinuses and mastoid air cells are clear. Debris is present within the external auditory canals bilaterally. Globes and orbits  are within normal limits. CT CERVICAL SPINE FINDINGS Alignment: AP alignment is anatomic. Rightward curvature is likely positional. Skull base and vertebrae: The craniocervical junction is normal. Vertebral body heights are maintained. No acute fracture or traumatic subluxation is present. Soft tissues and spinal canal: The soft tissues the neck are unremarkable. No significant intracanalicular lesion is evident. Disc levels:  No focal stenosis is evident. Upper chest: The lung apices are clear. IMPRESSION: 1. No acute intracranial abnormality or significant interval change. 2. Mild age advanced atrophy and white matter disease including cerebellar atrophy. 3. No acute fracture or traumatic subluxation within the cervical spine. Electronically Signed   By: San Morelle M.D.   On: 05/26/2016 18:51     EKG: Independently reviewed. Sinus rhythm, QTC 436, no ischemic change.  Assessment/Plan Principal Problem:   Seizure ([place]) Active Problems:   Alcohol use  disorder, severe, dependence ([facility])   Alcoholic hepatitis without ascites   MDD (major depressive disorder), recurrent severe, without psychosis ([place])   Tobacco abuse   Homeless   Essential hypertension   Seizure [facility]): most likely due to alcohol withdraw since pt attempted to cutting back his drinking. CT scan of head and C spin negative. Airway is protected. Hemodynamically stable. -will place on tele bed for obs -started CIWA protocol -seizure precaution. -CIWA score is 19-->start Librium 5 mg tid -start protonix for possible alcoholic gastritis -IVF: NS 125cc/h  Tobacco abuse and Alcohol abuse: -Did counseling about importance of quitting smoking -Nicotine patch -Did counseling about the importance of quitting drinking -CIWA protocol  Depression and anxiety:  no suicidal or homicidal ideations. Not taking meds. -will resume Fluoxetine now  Homeless: -SW and CM consult  HTN: not taking meds at home. Bp 140/96 -IV hydralazine when necessary   DVT ppx: SQ Lovenox Code Status: Full code Family Communication: None at bed side.  Disposition Plan:  Anticipate discharge to shelter, consulted SW for homeless issue Consults called:  none Admission status: Obs / tele    Date of Service 05/26/2016    Ivor Costa [facility] Pager [phone]  If 7PM-7AM, please contact night-coverage [email or web address] Password [email or web address] 05/26/2016, 9:01 PM

## 2016-05-26 NOTE — ED Triage Notes (Signed)
Per [facility] patient brought in for having seizures witnessed by friend.  Patient did have some tremors noted and gets little agitated with EMS.  Pt reports to EMS that he hasnt had as much ETOH today that he normally drinks.  Patient has 18g left hand. Vital signs 140/88, HR 101, 18RR, 94%.

## 2016-05-27 LAB — COMPREHENSIVE METABOLIC PANEL
ALT: 74 U/L — ABNORMAL HIGH (ref 17–63)
ANION GAP: 10 (ref 5–15)
AST: 138 U/L — ABNORMAL HIGH (ref 15–41)
Albumin: 3.6 g/dL (ref 3.5–5.0)
Alkaline Phosphatase: 143 U/L — ABNORMAL HIGH (ref 38–126)
BILIRUBIN TOTAL: 0.8 mg/dL (ref 0.3–1.2)
BUN: 11 mg/dL (ref 6–20)
CO2: 25 mmol/L (ref 22–32)
Calcium: 8.1 mg/dL — ABNORMAL LOW (ref 8.9–10.3)
Chloride: 108 mmol/L (ref 101–111)
Creatinine, Ser: 0.77 mg/dL (ref 0.61–1.24)
Glucose, Bld: 87 mg/dL (ref 65–99)
POTASSIUM: 3.6 mmol/L (ref 3.5–5.1)
Sodium: 143 mmol/L (ref 135–145)
TOTAL PROTEIN: 6.7 g/dL (ref 6.5–8.1)

## 2016-05-27 LAB — CBC
HEMATOCRIT: 33.3 % — AB (ref 39.0–52.0)
HEMOGLOBIN: 11.1 g/dL — AB (ref 13.0–17.0)
MCH: 31.4 pg (ref 26.0–34.0)
MCHC: 33.3 g/dL (ref 30.0–36.0)
MCV: 94.1 fL (ref 78.0–100.0)
Platelets: 140 10*3/uL — ABNORMAL LOW (ref 150–400)
RBC: 3.54 MIL/uL — AB (ref 4.22–5.81)
RDW: 13.4 % (ref 11.5–15.5)
WBC: 10.7 10*3/uL — AB (ref 4.0–10.5)

## 2016-05-27 LAB — GLUCOSE, CAPILLARY: Glucose-Capillary: 124 mg/dL — ABNORMAL HIGH (ref 65–99)

## 2016-05-27 NOTE — Progress Notes (Signed)
Pt. Arrived to Rm 1508 @ 2115 from the Ed. Pt. Is sleepy but responds appropriately when spoken to. Pt. Is unable to tell me what the day is but is oriented to self and place. Pt. Denies having visual or auditory disturbances. Admits to feeling some pins and needle sensation in BLE and has visible tremors. Upon assessment pt. Is noted to have MASD to his feet with areas between both pinky toes that are tender to touch. Pt's. Feet washed and dried.

## 2016-05-27 NOTE — Progress Notes (Signed)
Initial Nutrition Assessment  DOCUMENTATION CODES:   Not applicable  INTERVENTION:   RD will order Ensure Enlive po BID when diet advanced, each supplement provides 350 kcal and 20 grams of protein  MVI  NUTRITION DIAGNOSIS:   Inadequate oral intake related to inability to eat, poor appetite as evidenced by per patient/family report, NPO status.  GOAL:   Patient will meet greater than or equal to 90% of their needs  MONITOR:   Diet advancement, Labs, Weight trends  REASON FOR ASSESSMENT:   Malnutrition Screening Tool    ASSESSMENT:   51 y.o. male with medical history significant of alcohol abuse, alcoholic hepatitis, seizure, tobacco abuse, homelessness, depression, hypertension, who presents with seizure.   Met with pt in room today. Pt not feeling well today and having tremors. Pt reports poor appetite for two weeks pta. Pt reports that he has lost 10lbs. Per chart, pt has lost 8lbs(4%) in 1 month. This is not significant but worth noting given history. Per H&P, pt is homeless. Pt would like to have Ensure once diet advanced. RD will continue to monitor for pt's needs.   Medications reviewed and include: lovenox, folic acid, MVI, nicotine, protonix, thiamine  Labs reviewed: Ca 8.1(L), AlkPhos 143(H), AST 138(H), ALT 74(H) WBC- 10.7(H)  Nutrition-Focused physical exam completed. Findings are no fat depletion, no muscle depletion, and no edema.   Diet Order:  Diet NPO time specified Except for: Sips with Meds, Ice Chips  Skin:  Reviewed, no issues  Last BM:  3/7  Height:   Ht Readings from Last 1 Encounters:  05/26/16 _0  (1.676 m)    Weight:   Wt Readings from Last 1 Encounters:  05/26/16 170 lb (77.1 kg)    Ideal Body Weight:  64.5 kg  BMI:  Body mass index is 27.44 kg/m.  Estimated Nutritional Needs:   Kcal:  2000-2300kcal/day   Protein:  100-116g/day   Fluid:  >2L/day   EDUCATION NEEDS:   Education needs no appropriate at this  time  Koleen Distance, RD, [place] Pager #[phone]

## 2016-05-27 NOTE — Progress Notes (Addendum)
CSW met with pt to discuss needed homelessness resources, per consult.  CSW and pt discussed pt's reporting that he had a "problem with drinking".  CSW provided active listening and validated the pt's concerns with these issues.  CSW provided pt with a meeting schedule for area Alcoholics Anonymous groups.  CSW provided education to the pt as to the efficacy of 12-step programs for community support for those needing support in addition to or other than outpatient treatment.  Pt appreciated CSW's efforts and thanked the CSW.  CSW completed SBIRT with pt and pt scored a consumption score of 12, a dependence score of 12 and an Alcohol/Substance Related Problem Score of 11.  CSW called [name] at ph: [phone] with pt's verbal permission and confirmed [name] had open beds at one of his "[facility] homes in [place].  [name] stated he would call the pt on the morning of 05/28/16 and ask the pt if the pt would commit to living in this home, staying sober, attend 12-Step meetings, and pay back the [facility] with a percentage of the proceeds of his checks once the pt attained a job.  If pt confirms [name] will pick pt up and transport the pt to his [facility] on the day of discharge.  Pt confirmed with the CSW he would be willing to do so.  [name] was provided with the CSW's phone number at ph: [phone] who is on-duty during the day on 05/28/16 and [name] can answer any questions the CSW on duty might have.   A second CSW consult was placed for medication needs and CM needs to be alerted.  This is a CM consult need.  CSW will contact CM and update physician of possible consult needed for CM for assistance with medications. Pt paged covering physician and was unable to reach.  Pt will need a second consult placed for assistance with medications using MATCH.  CSW updated RN who agreed to contact covering physician on 3/8.  CSW signing off.  Please reconsult if  future social work needs arise.    Barry Moore. Barry Moore, Latanya Presser, LCAS Clinical Social Worker Ph: [phone]

## 2016-05-27 NOTE — Clinical Social Work Note (Signed)
Clinical Social Work Assessment  Patient Details  Name: Barry Moore MRN: [id number] Date of Birth: 1965/11/17  Date of referral:  05/27/16               Reason for consult:  Substance Use/ETOH Abuse, Housing Concerns/Homelessness                Permission sought to share information with:  Facility Sport and exercise psychologist, Family Supports Permission granted to share information::  Yes, Verbal Permission Granted  Name::      Barry Moore:: N/A    Relationship::     Contact Information:    Barry Moore at [employer]  Housing/Transportation Moore arrangements for the past 2 months:  Homeless Source of Information:  Patient Patient Interpreter Needed:  None Criminal Activity/Legal Involvement Pertinent to Current Situation/Hospitalization:    Significant  Do you feel safe going back to the place where you live?  No Need for family participation in patient care:  No (Coment)  Care giving concerns:  None listed by pt/family   Social Worker assessment / plan:  CSW met with pt and confirmed pt's plan to be discharged to [facility] at ph: [phone] to live at discharge.  CSW provided active listening and validated pt's concerns over homelessness and alcohol use.   CSW called Barry Moore with pt's verbal permission and confirmed Barry Moore had open beds at one of his "[facility] homes in [place].  Barry Moore stated he would call the pt on the morning of 05/28/16 and ask the pt if the pt would commit to Moore in this home, staying sober, attend 12-Step meetings, and pay back the [facility] with a percentage of the proceeds of his checks once the pt attained a job.  If pt confirms Barry Moore will pick pt up and transport the pt to his [facility] on the day of discharge.  Pt confirmed with the CSW he would be willing to do so.  Barry Moore was provided with the CSW's phone number at ph: [phone] who is on-duty  during the day on 05/28/16 and Barry Moore can answer any questions the CSW on duty might have .  Pt has been homeless in [place] prior to being admitted to [facility].  Employment status:  Unemployed Forensic scientist:  Self Pay (Medicaid Pending) PT Recommendations:  Not assessed at this time Information / Referral to community resources:  Other (Comment Required), Residential Substance Abuse Treatment Options  Patient/Family's Response to care:  Patient alert and oriented.  Patient agreeable to plan.  Pt currently has no family/Barry support.  Pt.pleasant and appreciated CSW intervention.    Patient/Family's Understanding of and Emotional Response to Diagnosis, Current Treatment, and Prognosis:  None listed by pt/family   Emotional Assessment Appearance:  Appears stated age Attitude/Demeanor/Rapport:    Affect (typically observed):  Calm, Pleasant, Accepting, Adaptable Orientation:  Oriented to Self, Oriented to Place, Oriented to  Time, Oriented to Situation Alcohol / Substance use:    Psych involvement (Current and /or in the community):     Discharge Needs  Concerns to be addressed:  Substance Abuse Concerns Readmission within the last 30 days:  No (2 ED's visits) Current discharge risk:  Substance Abuse Barriers to Discharge:   ([place] (seizures))   Barry Moore, LCSWA 05/27/2016, 9:30 PM

## 2016-05-27 NOTE — Progress Notes (Signed)
PROGRESS NOTE  Barry Moore CHE:[id number] DOB: [birth year] DOA: 05/26/2016 PCP: No PCP Per Patient   LOS: 0 days   Brief Narrative: Barry Moore is a 51 y.o. male with medical history significant of alcohol abuse, alcoholic hepatitis, seizure, tobacco abuse, homelessness, depression, hypertension, who presented with seizures in the setting of alcohol withdrawal.  Assessment & Plan: Principal Problem:   Seizure ([place]) Active Problems:   Alcohol use disorder, severe, dependence ([place])   Alcoholic hepatitis without ascites   MDD (major depressive disorder), recurrent severe, without psychosis ([place])   Tobacco abuse   Homeless   Essential hypertension   Alcohol abuse with withdrawal seizures -Patient drinks significant amounts of alcohol every day, he has been withdrawing and having seizures with an alcohol level of ~ 400. -He appears tremulous this morning, has not had any seizures overnight -Continue CIWA, continue Librium -Seizure precautions  Tobacco abuse -counseled for cessation  Depression and anxiety -continue Fluoxetine now  Homeless -SW and CM consult  HTN -IV hydralazine when necessary -Not on any chronic medications  DVT prophylaxis: Lovenox Code Status: Full code Family Communication: no family present Disposition Plan: home when ready   Consultants:   None   Procedures:   None   Antimicrobials:  None    Subjective: -Feels tremulous, denies any chest pain, denies any shortness of breath, no abdominal pain, nausea or vomiting.  States he has occasional auditory hallucinations when he is half asleep, hears someone calling his name  Objective: Vitals:   05/26/16 2000 05/26/16 2106 05/26/16 2135 05/27/16 0442  BP: 112/74 (!) 117/101 118/78 (!) 144/89  Pulse: 111 111 (!) 110 100  Resp: 24 24 20 19   Temp:   98.9 F (37.2 C) 99.2 F (37.3 C)  TempSrc:   Oral Oral  SpO2: 94% 93% 97% 98%  Weight:   77.1 kg (170 lb)   Height:   5\' 6"  (1.676 m)       Intake/Output Summary (Last 24 hours) at 05/27/16 1217 Last data filed at 05/27/16 0900  Gross per 24 hour  Intake             1025 ml  Output                0 ml  Net             1025 ml   Filed Weights   05/26/16 2135  Weight: 77.1 kg (170 lb)    Examination: Constitutional: NAD Vitals:   05/26/16 2000 05/26/16 2106 05/26/16 2135 05/27/16 0442  BP: 112/74 (!) 117/101 118/78 (!) 144/89  Pulse: 111 111 (!) 110 100  Resp: 24 24 20 19   Temp:   98.9 F (37.2 C) 99.2 F (37.3 C)  TempSrc:   Oral Oral  SpO2: 94% 93% 97% 98%  Weight:   77.1 kg (170 lb)   Height:   5\' 6"  (1.676 m)     Eyes: PERRL, lids and conjunctivae normal, no scleral icterus Respiratory: clear to auscultation bilaterally, no wheezing, no crackles. Normal respiratory effort.  Cardiovascular: Regular rate and rhythm, no murmurs / rubs / gallops. No LE edema. 2+ pedal pulses. No carotid bruits.  Abdomen: no tenderness. Bowel sounds positive.  Musculoskeletal: no clubbing / cyanosis.  Skin: no rashes, lesions, ulcers. No induration. Tattoos present Psychiatric: Normal judgment and insight. Alert and oriented x 3. Normal mood.    Data Reviewed: I have personally reviewed following labs and imaging studies  CBC:  Recent Labs Lab 05/26/16 1832  05/27/16 0620  WBC 9.5 10.7*  NEUTROABS 6.2  --   HGB 12.7* 11.1*  HCT 36.9* 33.3*  MCV 93.7 94.1  PLT 196 242*   Basic Metabolic Panel:  Recent Labs Lab 05/26/16 1832 05/27/16 0620  NA 140 143  K 4.0 3.6  CL 103 108  CO2 27 25  GLUCOSE 108* 87  BUN 9 11  CREATININE 0.87 0.77  CALCIUM 8.8* 8.1*   GFR: Estimated Creatinine Clearance: 106.8 mL/min (by C-G formula based on SCr of 0.77 mg/dL). Liver Function Tests:  Recent Labs Lab 05/26/16 1832 05/27/16 0620  AST 188* 138*  ALT 93* 74*  ALKPHOS 191* 143*  BILITOT 0.3 0.8  PROT 8.0 6.7  ALBUMIN 4.1 3.6   No results for input(s): LIPASE, AMYLASE in the last 168 hours. No results for  input(s): AMMONIA in the last 168 hours. Coagulation Profile: No results for input(s): INR, PROTIME in the last 168 hours. Cardiac Enzymes: No results for input(s): CKTOTAL, CKMB, CKMBINDEX, TROPONINI in the last 168 hours. BNP (last 3 results) No results for input(s): PROBNP in the last 8760 hours. HbA1C: No results for input(s): HGBA1C in the last 72 hours. CBG:  Recent Labs Lab 05/26/16 1728  GLUCAP 124*   Lipid Profile: No results for input(s): CHOL, HDL, LDLCALC, TRIG, CHOLHDL, LDLDIRECT in the last 72 hours. Thyroid Function Tests: No results for input(s): TSH, T4TOTAL, FREET4, T3FREE, THYROIDAB in the last 72 hours. Anemia Panel: No results for input(s): VITAMINB12, FOLATE, FERRITIN, TIBC, IRON, RETICCTPCT in the last 72 hours. Urine analysis:    Component Value Date/Time   COLORURINE COLORLESS (A) 03/04/2016 1600   APPEARANCEUR CLEAR 03/04/2016 1600   LABSPEC 1.003 (L) 03/04/2016 1600   PHURINE 5.0 03/04/2016 1600   GLUCOSEU NEGATIVE 03/04/2016 1600   HGBUR NEGATIVE 03/04/2016 1600   BILIRUBINUR NEGATIVE 03/04/2016 1600   KETONESUR NEGATIVE 03/04/2016 1600   PROTEINUR NEGATIVE 03/04/2016 1600   NITRITE NEGATIVE 03/04/2016 1600   LEUKOCYTESUR NEGATIVE 03/04/2016 1600   Sepsis Labs: Invalid input(s): PROCALCITONIN, LACTICIDVEN  No results found for this or any previous visit (from the past 240 hour(s)).    Radiology Studies: Ct Head Wo Contrast  Result Date: 05/26/2016 CLINICAL DATA:  Witnessed seizure.  Altered mental status. EXAM: CT HEAD WITHOUT CONTRAST CT CERVICAL SPINE WITHOUT CONTRAST TECHNIQUE: Multidetector CT imaging of the head and cervical spine was performed following the standard protocol without intravenous contrast. Multiplanar CT image reconstructions of the cervical spine were also generated. COMPARISON:  CT HEAD AND CERVICAL SPINE 11/21/2015. FINDINGS: CT HEAD FINDINGS Brain: A mild atrophy and white matter changes are advanced for age, but stable  no acute infarct, hemorrhage, or mass lesion is present. The ventricles are of normal size. No significant extraaxial fluid collection is present. Cerebellar atrophy is noted. Vascular: Atherosclerotic calcifications are present within the cavernous internal carotid arteries. There is no hyperdense vessel. Skull: The calvarium is within normal limits. No focal lytic or blastic lesions are present. Sinuses/Orbits: The paranasal sinuses and mastoid air cells are clear. Debris is present within the external auditory canals bilaterally. Globes and orbits are within normal limits. CT CERVICAL SPINE FINDINGS Alignment: AP alignment is anatomic. Rightward curvature is likely positional. Skull base and vertebrae: The craniocervical junction is normal. Vertebral body heights are maintained. No acute fracture or traumatic subluxation is present. Soft tissues and spinal canal: The soft tissues the neck are unremarkable. No significant intracanalicular lesion is evident. Disc levels:  No focal stenosis is evident. Upper chest:  The lung apices are clear. IMPRESSION: 1. No acute intracranial abnormality or significant interval change. 2. Mild age advanced atrophy and white matter disease including cerebellar atrophy. 3. No acute fracture or traumatic subluxation within the cervical spine. Electronically Signed   By: San Morelle M.D.   On: 05/26/2016 18:51   Ct Cervical Spine Wo Contrast  Result Date: 05/26/2016 CLINICAL DATA:  Witnessed seizure.  Altered mental status. EXAM: CT HEAD WITHOUT CONTRAST CT CERVICAL SPINE WITHOUT CONTRAST TECHNIQUE: Multidetector CT imaging of the head and cervical spine was performed following the standard protocol without intravenous contrast. Multiplanar CT image reconstructions of the cervical spine were also generated. COMPARISON:  CT HEAD AND CERVICAL SPINE 11/21/2015. FINDINGS: CT HEAD FINDINGS Brain: A mild atrophy and white matter changes are advanced for age, but stable no acute  infarct, hemorrhage, or mass lesion is present. The ventricles are of normal size. No significant extraaxial fluid collection is present. Cerebellar atrophy is noted. Vascular: Atherosclerotic calcifications are present within the cavernous internal carotid arteries. There is no hyperdense vessel. Skull: The calvarium is within normal limits. No focal lytic or blastic lesions are present. Sinuses/Orbits: The paranasal sinuses and mastoid air cells are clear. Debris is present within the external auditory canals bilaterally. Globes and orbits are within normal limits. CT CERVICAL SPINE FINDINGS Alignment: AP alignment is anatomic. Rightward curvature is likely positional. Skull base and vertebrae: The craniocervical junction is normal. Vertebral body heights are maintained. No acute fracture or traumatic subluxation is present. Soft tissues and spinal canal: The soft tissues the neck are unremarkable. No significant intracanalicular lesion is evident. Disc levels:  No focal stenosis is evident. Upper chest: The lung apices are clear. IMPRESSION: 1. No acute intracranial abnormality or significant interval change. 2. Mild age advanced atrophy and white matter disease including cerebellar atrophy. 3. No acute fracture or traumatic subluxation within the cervical spine. Electronically Signed   By: San Morelle M.D.   On: 05/26/2016 18:51     Scheduled Meds: . chlordiazePOXIDE  5 mg Oral TID  . enoxaparin (LOVENOX) injection  40 mg Subcutaneous Q24H  . FLUoxetine  20 mg Oral Daily  . folic acid  1 mg Oral Daily  . LORazepam  0-4 mg Intravenous Q6H   Followed by  . [START ON 05/28/2016] LORazepam  0-4 mg Intravenous Q12H  . multivitamin with minerals  1 tablet Oral Daily  . nicotine  21 mg Transdermal Daily  . pantoprazole  40 mg Oral Q1200  . sodium chloride flush  3 mL Intravenous Q12H  . thiamine  100 mg Oral Daily   Or  . thiamine  100 mg Intravenous Daily   Continuous Infusions: . sodium  chloride 125 mL/hr at 05/27/16 0612   Marzetta Board, MD, PhD [facility] Pager [phone]  If 7PM-7AM, please contact night-coverage [email or web address] Password [facility] 05/27/2016, 12:17 PM

## 2016-05-27 NOTE — Care Management Note (Signed)
Case Management Note  Patient Details  Name: Barry Moore MRN: [id number] Date of Birth: 1965-07-02  Subjective/Objective:                  Seizure [facility] Action/Plan: Discharge planning Expected Discharge Date:   (unknown)               Expected Discharge Plan:  Homeless Shelter  In-House Referral:     Discharge planning Services  CM Consult  Post Acute Care Choice:    Choice offered to:  Patient  DME Arranged:  N/A DME Agency:  NA  HH Arranged:  NA HH Agency:  NA  Status of Service:  In process, will continue to follow  If discussed at Long Length of Stay Meetings, dates discussed:    Additional Comments: CM met with pt to discuss discharge needs. Pt willing to go to residential [facility] for treatment.  Pt [facility] 2016 at 10:00am (also on AVS).  If pt is in need of medication, CM will need to be contacted for [facility].  CSW to provide bus passes for appointments. CM will continue to follow for additional needs. Dellie Catholic, RN 05/27/2016, 3:13 PM

## 2016-05-28 ENCOUNTER — Encounter (HOSPITAL_COMMUNITY): Payer: Self-pay

## 2016-05-28 LAB — CBC
HCT: 34 % — ABNORMAL LOW (ref 39.0–52.0)
HEMOGLOBIN: 11.3 g/dL — AB (ref 13.0–17.0)
MCH: 30.9 pg (ref 26.0–34.0)
MCHC: 33.2 g/dL (ref 30.0–36.0)
MCV: 92.9 fL (ref 78.0–100.0)
PLATELETS: 136 10*3/uL — AB (ref 150–400)
RBC: 3.66 MIL/uL — ABNORMAL LOW (ref 4.22–5.81)
RDW: 13.1 % (ref 11.5–15.5)
WBC: 12.7 10*3/uL — ABNORMAL HIGH (ref 4.0–10.5)

## 2016-05-28 LAB — COMPREHENSIVE METABOLIC PANEL
ALK PHOS: 155 U/L — AB (ref 38–126)
ALT: 70 U/L — AB (ref 17–63)
ANION GAP: 8 (ref 5–15)
AST: 133 U/L — ABNORMAL HIGH (ref 15–41)
Albumin: 3.7 g/dL (ref 3.5–5.0)
BUN: 7 mg/dL (ref 6–20)
CALCIUM: 8.4 mg/dL — AB (ref 8.9–10.3)
CO2: 23 mmol/L (ref 22–32)
CREATININE: 0.61 mg/dL (ref 0.61–1.24)
Chloride: 104 mmol/L (ref 101–111)
Glucose, Bld: 94 mg/dL (ref 65–99)
Potassium: 3.5 mmol/L (ref 3.5–5.1)
SODIUM: 135 mmol/L (ref 135–145)
Total Bilirubin: 1.2 mg/dL (ref 0.3–1.2)
Total Protein: 6.6 g/dL (ref 6.5–8.1)

## 2016-05-28 LAB — GLUCOSE, CAPILLARY: GLUCOSE-CAPILLARY: 95 mg/dL (ref 65–99)

## 2016-05-28 MED ORDER — ENSURE ENLIVE PO LIQD
237.0000 mL | Freq: Two times a day (BID) | ORAL | Status: DC
  Administered 2016-05-28 – 2016-05-29 (×3): 237 mL via ORAL

## 2016-05-28 NOTE — Progress Notes (Signed)
PHARMACIST - PHYSICIAN COMMUNICATION  DR:   Cruzita Lederer  CONCERNING: IV to Oral Route Change Policy  RECOMMENDATION: This patient is receiving Thiamine 100 mg by the intravenous route. Based on criteria approved by the Pharmacy and Therapeutics Committee, the intravenous medication(s) is/are being converted to the equivalent oral dose form(s).   DESCRIPTION: These criteria include:  The patient is eating (either orally or via tube) and/or has been taking other orally administered medications for a least 24 hours  The patient has no evidence of active gastrointestinal bleeding or impaired GI absorption (gastrectomy, short bowel, patient on TNA or NPO).  If you have questions about this conversion, please contact the Pharmacy Department  []   [phone] )  [facility] []   [phone] )  [facility] []   [phone] )  [name] []   [phone] )  [facility] [x]   [phone] )  [place], [place] 05/28/2016 11:30 AM

## 2016-05-28 NOTE — Progress Notes (Signed)
[id number]/SFSELT Lucero Ide,BSN,RN3,CCM:[phone]/Referral for poss. LTACH transition made to both [facility] and [facility].

## 2016-05-28 NOTE — Progress Notes (Signed)
PROGRESS NOTE  Barry Moore CNO:[id number] DOB: May 11, 1965 DOA: 05/26/2016 PCP: No PCP Per Patient   LOS: 1 day   Brief Narrative: Barry Moore is a 51 y.o. male with medical history significant of alcohol abuse, alcoholic hepatitis, seizure, tobacco abuse, homelessness, depression, hypertension, who presented with seizures in the setting of alcohol withdrawal.  Assessment & Plan: Principal Problem:   Seizure ([place]) Active Problems:   Alcohol use disorder, severe, dependence ([place])   Alcoholic hepatitis without ascites   MDD (major depressive disorder), recurrent severe, without psychosis ([place])   Tobacco abuse   Homeless   Essential hypertension   Alcohol abuse with withdrawal seizures -Patient drinks significant amounts of alcohol every day, he has been withdrawing and having seizures with an alcohol level of ~ 400. -No further seizures -Continue CIWA, continue Librium -Seizure precautions -Seems to be withdrawing less this morning -Appreciate social worker input, patient was given resources for outpatient follow-up in terms of alcohol abstinence  Tobacco abuse -counseled for cessation  Depression and anxiety -continue Fluoxetine now  Homeless -SW and CM consult  HTN -IV hydralazine when necessary -Not on any chronic medications  DVT prophylaxis: Lovenox Code Status: Full code Family Communication: no family present Disposition Plan: home when ready   Consultants:   None   Procedures:   None   Antimicrobials:  None    Subjective: -Continues to feel "rough", no chest pain, no shortness of breath, no fever chills  Objective: Vitals:   05/27/16 0442 05/27/16 1452 05/28/16 0001 05/28/16 0542  BP: (!) 144/89 (!) 156/87 (!) 148/100 (!) 152/100  Pulse: 100 (!) 107 85 91  Resp: 19 18 18 18   Temp: 99.2 F (37.3 C) 99.5 F (37.5 C) 98.5 F (36.9 C) 99.2 F (37.3 C)  TempSrc: Oral Oral Oral Oral  SpO2: 98% 98% 98% 98%  Weight:      Height:         Intake/Output Summary (Last 24 hours) at 05/28/16 1121 Last data filed at 05/27/16 1512  Gross per 24 hour  Intake              240 ml  Output              700 ml  Net             -460 ml   Filed Weights   05/26/16 2135  Weight: 77.1 kg (170 lb)    Examination: Constitutional: NAD Vitals:   05/27/16 0442 05/27/16 1452 05/28/16 0001 05/28/16 0542  BP: (!) 144/89 (!) 156/87 (!) 148/100 (!) 152/100  Pulse: 100 (!) 107 85 91  Resp: 19 18 18 18   Temp: 99.2 F (37.3 C) 99.5 F (37.5 C) 98.5 F (36.9 C) 99.2 F (37.3 C)  TempSrc: Oral Oral Oral Oral  SpO2: 98% 98% 98% 98%  Weight:      Height:        Eyes: PERRL, lids and conjunctivae normal, no scleral icterus Respiratory: clear to auscultation bilaterally, no wheezing, no crackles. Normal respiratory effort.  Cardiovascular: Regular rate and rhythm, no murmurs / rubs / gallops. No LE edema. 2+ pedal pulses. No carotid bruits.  Abdomen: no tenderness. Bowel sounds positive.  Musculoskeletal: no clubbing / cyanosis.  Skin: no rashes, lesions, ulcers. No induration. Tattoos present Psychiatric: Normal judgment and insight. Alert and oriented x 3. Normal mood.    Data Reviewed: I have personally reviewed following labs and imaging studies  CBC:  Recent Labs Lab 05/26/16 1832 05/27/16 0620 05/28/16  0544  WBC 9.5 10.7* 12.7*  NEUTROABS 6.2  --   --   HGB 12.7* 11.1* 11.3*  HCT 36.9* 33.3* 34.0*  MCV 93.7 94.1 92.9  PLT 196 140* 732*   Basic Metabolic Panel:  Recent Labs Lab 05/26/16 1832 05/27/16 0620 05/28/16 0544  NA 140 143 135  K 4.0 3.6 3.5  CL 103 108 104  CO2 27 25 23   GLUCOSE 108* 87 94  BUN 9 11 7   CREATININE 0.87 0.77 0.61  CALCIUM 8.8* 8.1* 8.4*   GFR: Estimated Creatinine Clearance: 106.8 mL/min (by C-G formula based on SCr of 0.61 mg/dL). Liver Function Tests:  Recent Labs Lab 05/26/16 1832 05/27/16 0620 05/28/16 0544  AST 188* 138* 133*  ALT 93* 74* 70*  ALKPHOS 191* 143* 155*   BILITOT 0.3 0.8 1.2  PROT 8.0 6.7 6.6  ALBUMIN 4.1 3.6 3.7   No results for input(s): LIPASE, AMYLASE in the last 168 hours. No results for input(s): AMMONIA in the last 168 hours. Coagulation Profile: No results for input(s): INR, PROTIME in the last 168 hours. Cardiac Enzymes: No results for input(s): CKTOTAL, CKMB, CKMBINDEX, TROPONINI in the last 168 hours. BNP (last 3 results) No results for input(s): PROBNP in the last 8760 hours. HbA1C: No results for input(s): HGBA1C in the last 72 hours. CBG:  Recent Labs Lab 05/26/16 1728 05/28/16 0742  GLUCAP 124* 95   Lipid Profile: No results for input(s): CHOL, HDL, LDLCALC, TRIG, CHOLHDL, LDLDIRECT in the last 72 hours. Thyroid Function Tests: No results for input(s): TSH, T4TOTAL, FREET4, T3FREE, THYROIDAB in the last 72 hours. Anemia Panel: No results for input(s): VITAMINB12, FOLATE, FERRITIN, TIBC, IRON, RETICCTPCT in the last 72 hours. Urine analysis:    Component Value Date/Time   COLORURINE COLORLESS (A) 03/04/2016 1600   APPEARANCEUR CLEAR 03/04/2016 1600   LABSPEC 1.003 (L) 03/04/2016 1600   PHURINE 5.0 03/04/2016 1600   GLUCOSEU NEGATIVE 03/04/2016 1600   HGBUR NEGATIVE 03/04/2016 1600   BILIRUBINUR NEGATIVE 03/04/2016 1600   KETONESUR NEGATIVE 03/04/2016 1600   PROTEINUR NEGATIVE 03/04/2016 1600   NITRITE NEGATIVE 03/04/2016 1600   LEUKOCYTESUR NEGATIVE 03/04/2016 1600   Sepsis Labs: Invalid input(s): PROCALCITONIN, LACTICIDVEN  No results found for this or any previous visit (from the past 240 hour(s)).    Radiology Studies: Ct Head Wo Contrast  Result Date: 05/26/2016 CLINICAL DATA:  Witnessed seizure.  Altered mental status. EXAM: CT HEAD WITHOUT CONTRAST CT CERVICAL SPINE WITHOUT CONTRAST TECHNIQUE: Multidetector CT imaging of the head and cervical spine was performed following the standard protocol without intravenous contrast. Multiplanar CT image reconstructions of the cervical spine were also  generated. COMPARISON:  CT HEAD AND CERVICAL SPINE 11/21/2015. FINDINGS: CT HEAD FINDINGS Brain: A mild atrophy and white matter changes are advanced for age, but stable no acute infarct, hemorrhage, or mass lesion is present. The ventricles are of normal size. No significant extraaxial fluid collection is present. Cerebellar atrophy is noted. Vascular: Atherosclerotic calcifications are present within the cavernous internal carotid arteries. There is no hyperdense vessel. Skull: The calvarium is within normal limits. No focal lytic or blastic lesions are present. Sinuses/Orbits: The paranasal sinuses and mastoid air cells are clear. Debris is present within the external auditory canals bilaterally. Globes and orbits are within normal limits. CT CERVICAL SPINE FINDINGS Alignment: AP alignment is anatomic. Rightward curvature is likely positional. Skull base and vertebrae: The craniocervical junction is normal. Vertebral body heights are maintained. No acute fracture or traumatic subluxation is present.  Soft tissues and spinal canal: The soft tissues the neck are unremarkable. No significant intracanalicular lesion is evident. Disc levels:  No focal stenosis is evident. Upper chest: The lung apices are clear. IMPRESSION: 1. No acute intracranial abnormality or significant interval change. 2. Mild age advanced atrophy and white matter disease including cerebellar atrophy. 3. No acute fracture or traumatic subluxation within the cervical spine. Electronically Signed   By: San Morelle M.D.   On: 05/26/2016 18:51   Ct Cervical Spine Wo Contrast  Result Date: 05/26/2016 CLINICAL DATA:  Witnessed seizure.  Altered mental status. EXAM: CT HEAD WITHOUT CONTRAST CT CERVICAL SPINE WITHOUT CONTRAST TECHNIQUE: Multidetector CT imaging of the head and cervical spine was performed following the standard protocol without intravenous contrast. Multiplanar CT image reconstructions of the cervical spine were also  generated. COMPARISON:  CT HEAD AND CERVICAL SPINE 11/21/2015. FINDINGS: CT HEAD FINDINGS Brain: A mild atrophy and white matter changes are advanced for age, but stable no acute infarct, hemorrhage, or mass lesion is present. The ventricles are of normal size. No significant extraaxial fluid collection is present. Cerebellar atrophy is noted. Vascular: Atherosclerotic calcifications are present within the cavernous internal carotid arteries. There is no hyperdense vessel. Skull: The calvarium is within normal limits. No focal lytic or blastic lesions are present. Sinuses/Orbits: The paranasal sinuses and mastoid air cells are clear. Debris is present within the external auditory canals bilaterally. Globes and orbits are within normal limits. CT CERVICAL SPINE FINDINGS Alignment: AP alignment is anatomic. Rightward curvature is likely positional. Skull base and vertebrae: The craniocervical junction is normal. Vertebral body heights are maintained. No acute fracture or traumatic subluxation is present. Soft tissues and spinal canal: The soft tissues the neck are unremarkable. No significant intracanalicular lesion is evident. Disc levels:  No focal stenosis is evident. Upper chest: The lung apices are clear. IMPRESSION: 1. No acute intracranial abnormality or significant interval change. 2. Mild age advanced atrophy and white matter disease including cerebellar atrophy. 3. No acute fracture or traumatic subluxation within the cervical spine. Electronically Signed   By: San Morelle M.D.   On: 05/26/2016 18:51     Scheduled Meds: . chlordiazePOXIDE  5 mg Oral TID  . enoxaparin (LOVENOX) injection  40 mg Subcutaneous Q24H  . feeding supplement (ENSURE ENLIVE)  237 mL Oral BID BM  . FLUoxetine  20 mg Oral Daily  . folic acid  1 mg Oral Daily  . LORazepam  0-4 mg Intravenous Q6H   Followed by  . LORazepam  0-4 mg Intravenous Q12H  . multivitamin with minerals  1 tablet Oral Daily  . nicotine  21 mg  Transdermal Daily  . pantoprazole  40 mg Oral Q1200  . sodium chloride flush  3 mL Intravenous Q12H  . thiamine  100 mg Oral Daily   Or  . thiamine  100 mg Intravenous Daily   Continuous Infusions: . sodium chloride 125 mL/hr at 05/28/16 0802   Marzetta Board, MD, PhD [facility] Pager [phone]  If 7PM-7AM, please contact night-coverage [email or web address] Password [email or web address] 05/28/2016, 11:21 AM

## 2016-05-28 NOTE — Progress Notes (Signed)
[id number]/referrals to [facility] cancelled per Dr. Rex Kras parties notified./Roan Miklos,BSN,RN3,CCM [phone].

## 2016-05-29 LAB — GLUCOSE, CAPILLARY: GLUCOSE-CAPILLARY: 85 mg/dL (ref 65–99)

## 2016-05-29 MED ORDER — CHLORDIAZEPOXIDE HCL 5 MG PO CAPS: 5.0000 mg | ORAL_CAPSULE | Freq: Three times a day (TID) | ORAL | 0 refills | Status: DC | PRN

## 2016-05-29 MED ORDER — MULTIVITAMINS PO CAPS: 1.0000 | ORAL_CAPSULE | Freq: Every day | ORAL | 0 refills | Status: DC

## 2016-05-29 NOTE — Discharge Summary (Signed)
Physician Discharge Summary  Barry Moore KGM:[id number] DOB: [birth year] DOA: 05/26/2016  PCP: No PCP Per Patient  Admit date: 05/26/2016 Discharge date: 05/29/2016  Admitted From: homeless Disposition:  "[facility]" home  Recommendations for Outpatient Follow-up:  1. Follow up with PCP as established below  Home Health: none Equipment/Devices: none  Discharge Condition: stable CODE STATUS: Full code Diet recommendation: regular  HPI: Per Dr. Blaine Hamper, Barry Moore is a 51 y.o. male with medical history significant of alcohol abuse, alcoholic hepatitis, seizure, tobacco abuse, homelessness, depression, hypertension, who presents with seizure.  Pt states he typically drinks "2 fifths and 2-3 40s" and since yesterday has only had 1 68ml bottle of malt liquor since he attempted to cut back on his drinking. Per his friend, pt may have had seized 9 times today, with seizures lasting up to 4 minutes in duration. He also has hand tremors bilaterally today. He is unsure if he hit his head or bit his tongue during his seizures but his friend states he does not believe so. Pt has some difficulty walking, but denies fall, unilateral weakness, difficulty speaking, vision change or hearing loss. He states that he has nausea and loose stool sometimes recently. He also has lightheadedness, agitation and increasing anxiety. Patient denies chest pain, cough, shortness of breath, abdominal pain or symptoms of UTI.  Hospital Course: Discharge Diagnoses:  Principal Problem:   Seizure [facility]) Active Problems:   Alcohol use disorder, severe, dependence ([place])   Alcoholic hepatitis without ascites   MDD (major depressive disorder), recurrent severe, without psychosis ([place])   Tobacco abuse   Homeless   Essential hypertension   Alcohol abuse with withdrawal seizures -Patient drinks significant amounts of alcohol every day, he has been withdrawing and having seizures with an alcohol level  of ~ 400.  He was monitored closely in the inpatient setting, he had no further seizures, he did exhibit withdrawal symptoms in the first hospital day, he subsequently improved, on the day of discharge he is back to normal, no longer has any tremors, he denies any auditory or visual hallucinations, and is not triggering CIWA.  Social worker was consulted and have discussed with patient, he was referred to local home in [place] who will be willing to give him room and board, helping with remaining abstinent from alcohol, and patient tells me that they will arrange for him to get a job as well.  He is determined to quit alcohol. Tobacco abuse -counseled for cessation Depression and anxiety -continue Fluoxetine now Homeless    Discharge Instructions   Allergies as of 05/29/2016      Reactions   Aspirin Nausea And Vomiting      Medication List    STOP taking these medications   cephALEXin 500 MG capsule Commonly known as:  [place] these medications   benztropine 0.5 MG tablet Commonly known as:  COGENTIN Take 1 tablet (0.5 mg total) by mouth 2 (two) times daily.   chlordiazePOXIDE 5 MG capsule Commonly known as:  LIBRIUM Take 1 capsule (5 mg total) by mouth 3 (three) times daily as needed for anxiety or withdrawal.   FLUoxetine 20 MG capsule Commonly known as:  PROZAC Take 1 capsule (20 mg total) by mouth daily.   gabapentin 300 MG capsule Commonly known as:  NEURONTIN Take 1 capsule (300 mg total) by mouth 3 (three) times daily at 8am, 3pm and bedtime.   haloperidol 5 MG tablet Commonly known as:  HALDOL Take 1 tablet (5 mg total) by mouth 2 (two) times daily.   hydrOXYzine 25 MG tablet Commonly known as:  ATARAX/VISTARIL Take 1 tablet (25 mg total) by mouth every 6 (six) hours as needed (anxiety/agitation or CIWA < or = 10).   mirtazapine 30 MG tablet Commonly known as:  REMERON Take 1 tablet (30 mg total) by mouth at bedtime.   multivitamin capsule Take 1  capsule by mouth daily.   nicotine polacrilex 2 MG gum Commonly known as:  NICORETTE Take 1 each (2 mg total) by mouth as needed for smoking cessation.      Follow-up Information    [facility] Follow up.   Specialty:  Internal Medicine Why:  You have a follow up medical appointment June 22, 2016 at 10:00am.  Contact information: [address] [phone]         Allergies  Allergen Reactions  . Aspirin Nausea And Vomiting    Consultations:  None   Procedures/Studies:  Ct Head Wo Contrast  Result Date: 05/26/2016 CLINICAL DATA:  Witnessed seizure.  Altered mental status. EXAM: CT HEAD WITHOUT CONTRAST CT CERVICAL SPINE WITHOUT CONTRAST TECHNIQUE: Multidetector CT imaging of the head and cervical spine was performed following the standard protocol without intravenous contrast. Multiplanar CT image reconstructions of the cervical spine were also generated. COMPARISON:  CT HEAD AND CERVICAL SPINE 11/21/2015. FINDINGS: CT HEAD FINDINGS Brain: A mild atrophy and white matter changes are advanced for age, but stable no acute infarct, hemorrhage, or mass lesion is present. The ventricles are of normal size. No significant extraaxial fluid collection is present. Cerebellar atrophy is noted. Vascular: Atherosclerotic calcifications are present within the cavernous internal carotid arteries. There is no hyperdense vessel. Skull: The calvarium is within normal limits. No focal lytic or blastic lesions are present. Sinuses/Orbits: The paranasal sinuses and mastoid air cells are clear. Debris is present within the external auditory canals bilaterally. Globes and orbits are within normal limits. CT CERVICAL SPINE FINDINGS Alignment: AP alignment is anatomic. Rightward curvature is likely positional. Skull base and vertebrae: The craniocervical junction is normal. Vertebral body heights are maintained. No acute fracture or traumatic  subluxation is present. Soft tissues and spinal canal: The soft tissues the neck are unremarkable. No significant intracanalicular lesion is evident. Disc levels:  No focal stenosis is evident. Upper chest: The lung apices are clear. IMPRESSION: 1. No acute intracranial abnormality or significant interval change. 2. Mild age advanced atrophy and white matter disease including cerebellar atrophy. 3. No acute fracture or traumatic subluxation within the cervical spine. Electronically Signed   By: San Morelle M.D.   On: 05/26/2016 18:51   Ct Cervical Spine Wo Contrast  Result Date: 05/26/2016 CLINICAL DATA:  Witnessed seizure.  Altered mental status. EXAM: CT HEAD WITHOUT CONTRAST CT CERVICAL SPINE WITHOUT CONTRAST TECHNIQUE: Multidetector CT imaging of the head and cervical spine was performed following the standard protocol without intravenous contrast. Multiplanar CT image reconstructions of the cervical spine were also generated. COMPARISON:  CT HEAD AND CERVICAL SPINE 11/21/2015. FINDINGS: CT HEAD FINDINGS Brain: A mild atrophy and white matter changes are advanced for age, but stable no acute infarct, hemorrhage, or mass lesion is present. The ventricles are of normal size. No significant extraaxial fluid collection is present. Cerebellar atrophy is noted. Vascular: Atherosclerotic calcifications are present within the cavernous internal carotid arteries. There is no hyperdense vessel. Skull: The calvarium is within normal limits. No focal  lytic or blastic lesions are present. Sinuses/Orbits: The paranasal sinuses and mastoid air cells are clear. Debris is present within the external auditory canals bilaterally. Globes and orbits are within normal limits. CT CERVICAL SPINE FINDINGS Alignment: AP alignment is anatomic. Rightward curvature is likely positional. Skull base and vertebrae: The craniocervical junction is normal. Vertebral body heights are maintained. No acute fracture or traumatic  subluxation is present. Soft tissues and spinal canal: The soft tissues the neck are unremarkable. No significant intracanalicular lesion is evident. Disc levels:  No focal stenosis is evident. Upper chest: The lung apices are clear. IMPRESSION: 1. No acute intracranial abnormality or significant interval change. 2. Mild age advanced atrophy and white matter disease including cerebellar atrophy. 3. No acute fracture or traumatic subluxation within the cervical spine. Electronically Signed   By: San Morelle M.D.   On: 05/26/2016 18:51      Subjective: - no chest pain, shortness of breath, no abdominal pain, nausea or vomiting. Feels great, slept well. Asking to go home  Discharge Exam: Vitals:   05/28/16 1949 05/29/16 0423  BP: (!) 149/99 (!) 153/101  Pulse: 90 82  Resp: 16 18  Temp: 99.1 F (37.3 C) 98.6 F (37 C)   Vitals:   05/28/16 0001 05/28/16 0542 05/28/16 1949 05/29/16 0423  BP: (!) 148/100 (!) 152/100 (!) 149/99 (!) 153/101  Pulse: 85 91 90 82  Resp: 18 18 16 18   Temp: 98.5 F (36.9 C) 99.2 F (37.3 C) 99.1 F (37.3 C) 98.6 F (37 C)  TempSrc: Oral Oral Oral Oral  SpO2: 98% 98% 99% 95%  Weight:      Height:        General: Pt is alert, awake, not in acute distress Cardiovascular: RRR, S1/S2 +, no rubs, no gallops Respiratory: CTA bilaterally, no wheezing, no rhonchi Abdominal: Soft, NT, ND, bowel sounds + Extremities: no edema, no cyanosis    The results of significant diagnostics from this hospitalization (including imaging, microbiology, ancillary and laboratory) are listed below for reference.     Microbiology: No results found for this or any previous visit (from the past 240 hour(s)).   Labs: BNP (last 3 results) No results for input(s): BNP in the last 8760 hours. Basic Metabolic Panel:  Recent Labs Lab 05/26/16 1832 05/27/16 0620 05/28/16 0544  NA 140 143 135  K 4.0 3.6 3.5  CL 103 108 104  CO2 27 25 23   GLUCOSE 108* 87 94  BUN 9  11 7   CREATININE 0.87 0.77 0.61  CALCIUM 8.8* 8.1* 8.4*   Liver Function Tests:  Recent Labs Lab 05/26/16 1832 05/27/16 0620 05/28/16 0544  AST 188* 138* 133*  ALT 93* 74* 70*  ALKPHOS 191* 143* 155*  BILITOT 0.3 0.8 1.2  PROT 8.0 6.7 6.6  ALBUMIN 4.1 3.6 3.7   No results for input(s): LIPASE, AMYLASE in the last 168 hours. No results for input(s): AMMONIA in the last 168 hours. CBC:  Recent Labs Lab 05/26/16 1832 05/27/16 0620 05/28/16 0544  WBC 9.5 10.7* 12.7*  NEUTROABS 6.2  --   --   HGB 12.7* 11.1* 11.3*  HCT 36.9* 33.3* 34.0*  MCV 93.7 94.1 92.9  PLT 196 140* 136*   Cardiac Enzymes: No results for input(s): CKTOTAL, CKMB, CKMBINDEX, TROPONINI in the last 168 hours. BNP: Invalid input(s): POCBNP CBG:  Recent Labs Lab 05/26/16 1728 05/28/16 0742 05/29/16 0803  GLUCAP 124* 95 85   D-Dimer No results for input(s): DDIMER in the last 72  hours. Hgb A1c No results for input(s): HGBA1C in the last 72 hours. Lipid Profile No results for input(s): CHOL, HDL, LDLCALC, TRIG, CHOLHDL, LDLDIRECT in the last 72 hours. Thyroid function studies No results for input(s): TSH, T4TOTAL, T3FREE, THYROIDAB in the last 72 hours.  Invalid input(s): FREET3 Anemia work up No results for input(s): VITAMINB12, FOLATE, FERRITIN, TIBC, IRON, RETICCTPCT in the last 72 hours. Urinalysis    Component Value Date/Time   COLORURINE COLORLESS (A) 03/04/2016 1600   APPEARANCEUR CLEAR 03/04/2016 1600   LABSPEC 1.003 (L) 03/04/2016 1600   PHURINE 5.0 03/04/2016 1600   GLUCOSEU NEGATIVE 03/04/2016 1600   HGBUR NEGATIVE 03/04/2016 1600   BILIRUBINUR NEGATIVE 03/04/2016 1600   KETONESUR NEGATIVE 03/04/2016 1600   PROTEINUR NEGATIVE 03/04/2016 1600   NITRITE NEGATIVE 03/04/2016 1600   LEUKOCYTESUR NEGATIVE 03/04/2016 1600   Sepsis Labs Invalid input(s): PROCALCITONIN,  WBC,  LACTICIDVEN Microbiology No results found for this or any previous visit (from the past 240  hour(s)).   Time coordinating discharge: 35 minutes  SIGNED:  Marzetta Board, MD  [facility] 05/29/2016, 1:51 PM Pager [phone]  If 7PM-7AM, please contact night-coverage [email or web address] Password [email or web address]

## 2016-05-29 NOTE — Discharge Instructions (Signed)
Follow with PCP as arranged  Please get a complete blood count and chemistry panel checked by your Primary MD at your next visit, and again as instructed by your Primary MD. Please get your medications reviewed and adjusted by your Primary MD.  Please request your Primary MD to go over all Hospital Tests and Procedure/Radiological results at the follow up, please get all Hospital records sent to your Prim MD by signing hospital release before you go home.  If you had Pneumonia of Lung problems at the Hospital: Please get a 2 view Chest X ray done in 6-8 weeks after hospital discharge or sooner if instructed by your Primary MD.  If you have Congestive Heart Failure: Please call your Cardiologist or Primary MD anytime you have any of the following symptoms:  1) 3 pound weight gain in 24 hours or 5 pounds in 1 week  2) shortness of breath, with or without a dry hacking cough  3) swelling in the hands, feet or stomach  4) if you have to sleep on extra pillows at night in order to breathe  Follow cardiac low salt diet and 1.5 lit/day fluid restriction.  If you have diabetes Accuchecks 4 times/day, Once in AM empty stomach and then before each meal. Log in all results and show them to your primary doctor at your next visit. If any glucose reading is under 80 or above 300 call your primary MD immediately.  If you have Seizure/Convulsions/Epilepsy: Please do not drive, operate heavy machinery, participate in activities at heights or participate in high speed sports until you have seen by Primary MD or a Neurologist and advised to do so again.  If you had Gastrointestinal Bleeding: Please ask your Primary MD to check a complete blood count within one week of discharge or at your next visit. Your endoscopic/colonoscopic biopsies that are pending at the time of discharge, will also need to followed by your Primary MD.  Get Medicines reviewed and adjusted. Please take all your medications with you  for your next visit with your Primary MD  Please request your Primary MD to go over all hospital tests and procedure/radiological results at the follow up, please ask your Primary MD to get all Hospital records sent to his/her office.  If you experience worsening of your admission symptoms, develop shortness of breath, life threatening emergency, suicidal or homicidal thoughts you must seek medical attention immediately by calling 911 or calling your MD immediately  if symptoms less severe.  You must read complete instructions/literature along with all the possible adverse reactions/side effects for all the Medicines you take and that have been prescribed to you. Take any new Medicines after you have completely understood and accpet all the possible adverse reactions/side effects.   Do not drive or operate heavy machinery when taking Pain medications.   Do not take more than prescribed Pain, Sleep and Anxiety Medications  Special Instructions: If you have smoked or chewed Tobacco  in the last 2 yrs please stop smoking, stop any regular Alcohol  and or any Recreational drug use.  Wear Seat belts while driving.  Please note You were cared for by a hospitalist during your hospital stay. If you have any questions about your discharge medications or the care you received while you were in the hospital after you are discharged, you can call the unit and asked to speak with the hospitalist on call if the hospitalist that took care of you is not available. Once you are discharged,  your primary care physician will handle any further medical issues. Please note that NO REFILLS for any discharge medications will be authorized once you are discharged, as it is imperative that you return to your primary care physician (or establish a relationship with a primary care physician if you do not have one) for your aftercare needs so that they can reassess your need for medications and monitor your lab values.  You  can reach the hospitalist office at phone [phone] or fax [phone]   If you do not have a primary care physician, you can call [phone] for a physician referral.  Activity: As tolerated with Full fall precautions use walker/cane & assistance as needed  Diet: regular  Disposition Home

## 2016-05-29 NOTE — Progress Notes (Signed)
05/29/16  1200  Reviewed discharge instructions with patient. Patient verbalized understanding of discharge instructions. Copy of discharge instructions and prescriptions given to patient. Bus pass given to patient.

## 2016-06-14 ENCOUNTER — Encounter (HOSPITAL_COMMUNITY): Payer: Self-pay | Admitting: Emergency Medicine

## 2016-06-14 ENCOUNTER — Emergency Department (HOSPITAL_COMMUNITY)
Admission: EM | Admit: 2016-06-14 | Discharge: 2016-06-15 | Disposition: A | Discharge status: Other Acute Inpatient Hospital | Payer: Self-pay | Attending: Emergency Medicine | Admitting: Emergency Medicine

## 2016-06-14 DIAGNOSIS — F32A Depression, unspecified: Secondary | ICD-10-CM

## 2016-06-14 DIAGNOSIS — F1024 Alcohol dependence with alcohol-induced mood disorder: Secondary | ICD-10-CM | POA: Diagnosis present

## 2016-06-14 DIAGNOSIS — F10920 Alcohol use, unspecified with intoxication, uncomplicated: Secondary | ICD-10-CM

## 2016-06-14 DIAGNOSIS — I10 Essential (primary) hypertension: Secondary | ICD-10-CM | POA: Insufficient documentation

## 2016-06-14 DIAGNOSIS — Z79899 Other long term (current) drug therapy: Secondary | ICD-10-CM | POA: Insufficient documentation

## 2016-06-14 DIAGNOSIS — F329 Major depressive disorder, single episode, unspecified: Secondary | ICD-10-CM | POA: Insufficient documentation

## 2016-06-14 DIAGNOSIS — R45851 Suicidal ideations: Secondary | ICD-10-CM | POA: Insufficient documentation

## 2016-06-14 DIAGNOSIS — F332 Major depressive disorder, recurrent severe without psychotic features: Secondary | ICD-10-CM | POA: Diagnosis present

## 2016-06-14 DIAGNOSIS — F10120 Alcohol abuse with intoxication, uncomplicated: Secondary | ICD-10-CM | POA: Insufficient documentation

## 2016-06-14 DIAGNOSIS — F17210 Nicotine dependence, cigarettes, uncomplicated: Secondary | ICD-10-CM | POA: Insufficient documentation

## 2016-06-14 LAB — COMPREHENSIVE METABOLIC PANEL
ALBUMIN: 3.9 g/dL (ref 3.5–5.0)
ALK PHOS: 192 U/L — AB (ref 38–126)
ALT: 60 U/L (ref 17–63)
ANION GAP: 11 (ref 5–15)
AST: 116 U/L — ABNORMAL HIGH (ref 15–41)
BUN: 6 mg/dL (ref 6–20)
CALCIUM: 8.7 mg/dL — AB (ref 8.9–10.3)
CHLORIDE: 103 mmol/L (ref 101–111)
CO2: 26 mmol/L (ref 22–32)
Creatinine, Ser: 0.66 mg/dL (ref 0.61–1.24)
GFR calc Af Amer: >60 mL/min (ref >60)
GFR calc non Af Amer: >60 mL/min (ref >60)
GLUCOSE: 93 mg/dL (ref 65–99)
Potassium: 3.7 mmol/L (ref 3.5–5.1)
SODIUM: 140 mmol/L (ref 135–145)
Total Bilirubin: 0.5 mg/dL (ref 0.3–1.2)
Total Protein: 8.2 g/dL — ABNORMAL HIGH (ref 6.5–8.1)

## 2016-06-14 LAB — CBC
HEMATOCRIT: 35.5 % — AB (ref 39.0–52.0)
HEMOGLOBIN: 12 g/dL — AB (ref 13.0–17.0)
MCH: 30.1 pg (ref 26.0–34.0)
MCHC: 33.8 g/dL (ref 30.0–36.0)
MCV: 89 fL (ref 78.0–100.0)
Platelets: 167 10*3/uL (ref 150–400)
RBC: 3.99 MIL/uL — AB (ref 4.22–5.81)
RDW: 13.5 % (ref 11.5–15.5)
WBC: 13.1 10*3/uL — AB (ref 4.0–10.5)

## 2016-06-14 LAB — CARBOXYHEMOGLOBIN - COOX: Carboxyhemoglobin: 4.8 % — ABNORMAL HIGH (ref 0.5–1.5)

## 2016-06-14 LAB — RAPID URINE DRUG SCREEN, HOSP PERFORMED
AMPHETAMINES: NOT DETECTED
BARBITURATES: NOT DETECTED
BENZODIAZEPINES: NOT DETECTED
COCAINE: NOT DETECTED
OPIATES: NOT DETECTED
TETRAHYDROCANNABINOL: NOT DETECTED

## 2016-06-14 LAB — I-STAT TROPONIN, ED: Troponin i, poc: 0 ng/mL (ref 0.00–0.08)

## 2016-06-14 LAB — ETHANOL: Alcohol, Ethyl (B): 436 mg/dL (ref <5)

## 2016-06-14 LAB — ACETAMINOPHEN LEVEL

## 2016-06-14 LAB — SALICYLATE LEVEL: Salicylate Lvl: <7 mg/dL (ref 2.8–30.0)

## 2016-06-14 MED ORDER — LORAZEPAM 2 MG/ML IJ SOLN: 0.0000 mg | Freq: Two times a day (BID) | INTRAMUSCULAR | Status: DC

## 2016-06-14 MED ORDER — ACETAMINOPHEN 325 MG PO TABS
650.0000 mg | ORAL_TABLET | ORAL | Status: DC | PRN
  Administered 2016-06-14: 650 mg via ORAL
  Filled 2016-06-14: qty 2

## 2016-06-14 MED ORDER — LORAZEPAM 2 MG/ML IJ SOLN
0.0000 mg | Freq: Four times a day (QID) | INTRAMUSCULAR | Status: DC
  Filled 2016-06-14: qty 1

## 2016-06-14 MED ORDER — VITAMIN B-1 100 MG PO TABS
100.0000 mg | ORAL_TABLET | Freq: Every day | ORAL | Status: DC
  Administered 2016-06-15: 100 mg via ORAL
  Filled 2016-06-14: qty 1

## 2016-06-14 MED ORDER — THIAMINE HCL 100 MG/ML IJ SOLN: 100.0000 mg | Freq: Every day | INTRAMUSCULAR | Status: DC

## 2016-06-14 NOTE — ED Triage Notes (Addendum)
Per EMS, pt's friend called EMS for patient seizure-like activity. EMS witness of episode showed shaking, trembling activity, pt remained alert and oriented throughout, stopped when distracted. No shaking activity during transport. Pt states he is withdrawing from alcohol and needs alcohol, pt's friend states pt drank about 7/8 of a fifth of vodka today, bottle at bedside. Pt reports feeling suicidal, plan to drink himself to death, tried to locate his knife to kill himself in ambulance. Pt asked RN for "a shot to kill me."   Pt adds c/o constant chest pain and SOB x several days.  Per EMS carbon monoxide level was 17, EMS administered high dose oxygen which lowered carbon monoxide to 9.

## 2016-06-14 NOTE — ED Notes (Signed)
Bedside reporting done with [facility]

## 2016-06-14 NOTE — ED Provider Notes (Addendum)
[facility] DEPT Provider Note   CSN: [id number] Arrival date & time: 06/14/16  1537     History   Chief Complaint Chief Complaint  Patient presents with  . Suicidal  . Alcohol Problem    HPI Ahmeer Tuman is a 51 y.o. male. CC:  Alcohol intoxication.   HPI:  52 year old male history of alcoholism. Presents via EMS with an episode of shaking. Not seizure activity per EMS she was awake during his episode of shaking. States he was suicidal. He drinks daily. States that he wants to kill himself by using a knife.  The carbon monoxide level is place of residence was high. Was given high flow O2.  Past Medical History:  Diagnosis Date  . Alcoholic hepatitis   . Depression   . ETOH abuse   . Hypertension   . Seizures [facility])     Patient Active Problem List   Diagnosis Date Noted  . Seizure ([place]) 05/26/2016  . Tobacco abuse 05/26/2016  . Homeless 05/26/2016  . Essential hypertension 05/26/2016  . MDD (major depressive disorder), recurrent severe, without psychosis ([place]) 03/06/2016  . Alcohol use disorder, severe, dependence ([place]) 02/14/2016  . Severe alcohol withdrawal without perceptual disturbances without complication ([place]) 67/02/4579  . Alcoholic hepatitis without ascites 02/14/2016  . Thrombocytopenia ([place]) 02/14/2016    Past Surgical History:  Procedure Laterality Date  . HERNIA REPAIR    . LEG SURGERY         Home Medications    Prior to Admission medications   Medication Sig Start Date End Date Taking? Authorizing Provider  benztropine (COGENTIN) 0.5 MG tablet Take 1 tablet (0.5 mg total) by mouth 2 (two) times daily. Patient not taking: Reported on 05/26/2016 03/09/16   Kerrie Buffalo, NP  chlordiazePOXIDE (LIBRIUM) 5 MG capsule Take 1 capsule (5 mg total) by mouth 3 (three) times daily as needed for anxiety or withdrawal. Patient not taking: Reported on 06/14/2016 05/29/16   Caren Griffins, MD  FLUoxetine (PROZAC) 20 MG capsule Take 1 capsule (20 mg  total) by mouth daily. Patient not taking: Reported on 05/26/2016 03/10/16   Kerrie Buffalo, NP  gabapentin (NEURONTIN) 300 MG capsule Take 1 capsule (300 mg total) by mouth 3 (three) times daily at 8am, 3pm and bedtime. Patient not taking: Reported on 05/26/2016 03/09/16   Kerrie Buffalo, NP  haloperidol (HALDOL) 5 MG tablet Take 1 tablet (5 mg total) by mouth 2 (two) times daily. Patient not taking: Reported on 05/26/2016 03/09/16   Kerrie Buffalo, NP  hydrOXYzine (ATARAX/VISTARIL) 25 MG tablet Take 1 tablet (25 mg total) by mouth every 6 (six) hours as needed (anxiety/agitation or CIWA < or = 10). Patient not taking: Reported on 05/26/2016 03/09/16   Kerrie Buffalo, NP  mirtazapine (REMERON) 30 MG tablet Take 1 tablet (30 mg total) by mouth at bedtime. Patient not taking: Reported on 05/26/2016 03/09/16   Kerrie Buffalo, NP  Multiple Vitamin (MULTIVITAMIN) capsule Take 1 capsule by mouth daily. Patient not taking: Reported on 06/14/2016 05/29/16   Caren Griffins, MD  nicotine polacrilex (NICORETTE) 2 MG gum Take 1 each (2 mg total) by mouth as needed for smoking cessation. Patient not taking: Reported on 05/26/2016 03/09/16   Kerrie Buffalo, NP    Family History Family History  Problem Relation Age of Onset  . Diabetes Mother   . Alcoholism Mother   . Emphysema Father   . Lung cancer Father   . Alcoholism Father     Social History Social History  Substance Use Topics  . Smoking status: Current Every Day Smoker    Packs/day: 2.00    Types: Cigarettes  . Smokeless tobacco: Never Used  . Alcohol use Yes     Comment: 5th liquor and one Forty daily       Allergies   Aspirin   Review of Systems Review of Systems  Constitutional: Negative for appetite change, chills, diaphoresis, fatigue and fever.  HENT: Negative for mouth sores, sore throat and trouble swallowing.   Eyes: Negative for visual disturbance.  Respiratory: Negative for cough, chest tightness, shortness of breath and  wheezing.   Cardiovascular: Negative for chest pain.  Gastrointestinal: Negative for abdominal distention, abdominal pain, diarrhea, nausea and vomiting.  Endocrine: Negative for polydipsia, polyphagia and polyuria.  Genitourinary: Negative for dysuria, frequency and hematuria.  Musculoskeletal: Negative for gait problem.  Skin: Negative for color change, pallor and rash.  Neurological: Negative for dizziness, syncope, light-headedness and headaches.  Hematological: Does not bruise/bleed easily.  Psychiatric/Behavioral: Positive for dysphoric mood and suicidal ideas. Negative for behavioral problems and confusion.     Physical Exam Updated Vital Signs BP 119/64   Pulse 97   Temp 98.9 F (37.2 C) (Oral)   Resp 18   SpO2 93%   Physical Exam  Constitutional: He is oriented to person, place, and time. He appears well-developed and well-nourished. No distress.  Sleeping. Awakens to vigorous stimulation.  HENT:  Head: Normocephalic.  Eyes: Conjunctivae are normal. Pupils are equal, round, and reactive to light. No scleral icterus.  Neck: Normal range of motion. Neck supple. No thyromegaly present.  Cardiovascular: Normal rate and regular rhythm.  Exam reveals no gallop and no friction rub.   No murmur heard. Pulmonary/Chest: Effort normal and breath sounds normal. No respiratory distress. He has no wheezes. He has no rales.  Abdominal: Soft. Bowel sounds are normal. He exhibits no distension. There is no tenderness. There is no rebound.  Musculoskeletal: Normal range of motion.  Neurological: He is alert and oriented to person, place, and time.  Skin: Skin is warm and dry. No rash noted.  Psychiatric: He has a normal mood and affect. His behavior is normal.     ED Treatments / Results  Labs (all labs ordered are listed, but only abnormal results are displayed) Labs Reviewed  COMPREHENSIVE METABOLIC PANEL - Abnormal; Notable for the following:       Result Value   Calcium 8.7  (*)    Total Protein 8.2 (*)    AST 116 (*)    Alkaline Phosphatase 192 (*)    All other components within normal limits  ETHANOL - Abnormal; Notable for the following:    Alcohol, Ethyl (B) 436 (*)    All other components within normal limits  ACETAMINOPHEN LEVEL - Abnormal; Notable for the following:    Acetaminophen (Tylenol), Serum <10 (*)    All other components within normal limits  CBC - Abnormal; Notable for the following:    WBC 13.1 (*)    RBC 3.99 (*)    Hemoglobin 12.0 (*)    HCT 35.5 (*)    All other components within normal limits  CARBOXYHEMOGLOBIN - COOX - Abnormal; Notable for the following:    Carboxyhemoglobin 4.8 (*)    All other components within normal limits  SALICYLATE LEVEL  RAPID URINE DRUG SCREEN, HOSP PERFORMED  I-STAT TROPOININ, ED    EKG  EKG Interpretation None       Radiology No results found.  Procedures Procedures (  including critical care time)  Medications Ordered in ED Medications  LORazepam (ATIVAN) injection 0-4 mg (not administered)    Followed by  LORazepam (ATIVAN) injection 0-4 mg (not administered)  thiamine (VITAMIN B-1) tablet 100 mg (not administered)    Or  thiamine (B-1) injection 100 mg (not administered)  acetaminophen (TYLENOL) tablet 650 mg (650 mg Oral Given 06/14/16 2221)     Initial Impression / Assessment and Plan / ED Course  I have reviewed the triage vital signs and the nursing notes.  Pertinent labs & imaging results that were available during my care of the patient were reviewed by me and considered in my medical decision making (see chart for details).     Patient sobering. Is ambulatory. Recheck alcohol level here. Awaiting TTS evaluation as he is more able to participate in the evaluation with his improving alcohol level.  CIWA protocol initiated.  Final Clinical Impressions(s) / ED Diagnoses   Final diagnoses:  Depression, unspecified depression type  Alcoholic intoxication without  complication ([place])  Suicidal thoughts    Patient exception to Behavioral Health inpatient.  New Prescriptions New Prescriptions   No medications on file     Tanna Furry, MD 06/14/16 0321    Tanna Furry, MD 06/15/16 [phone]

## 2016-06-14 NOTE — ED Notes (Signed)
EMS stated that patient is intoxicated and wanting to harm self. While in EMS truck patient was looking for his knife to stab himself. Patient did not located knife in route. Paramedic and this tech took patients jacket and book bag and didn't see any knife. Just cigarettes and a bottle of vodka. Security was asked to wand patient in hall bed to see if patient has knife on his person. Security attempted to wand patient but was unsuccessful to the stretcher setting off wand alarm. Security will attempt to wand patient when we change his clothing. Patient is too unstable to stand at this time. Patient asking for him alcohol to fix his withdraws.

## 2016-06-14 NOTE — ED Notes (Addendum)
Pt is calm and cooperative, Pt does admit to having SI with a plan to cut himself, and states that they had taken his knife. Pt states that he is trying to sleep to stop thinking about harming himself. Pt  does report thaving auditory hallucinations. Pt has a CIWA of 16.

## 2016-06-14 NOTE — ED Notes (Signed)
Pt has been seen and wand by Security.  Pt has 2 bgs of belongings.

## 2016-06-14 NOTE — Progress Notes (Signed)
Spoke with RN Lanelle Bal about Carboxyhemoglobin. RN is going to obtain venous sample for this lab value.

## 2016-06-15 ENCOUNTER — Encounter (HOSPITAL_COMMUNITY): Payer: Self-pay | Admitting: *Deleted

## 2016-06-15 ENCOUNTER — Inpatient Hospital Stay (HOSPITAL_COMMUNITY)
Admission: AD | Admit: 2016-06-15 | Discharge: 2016-06-23 | DRG: 897 | Disposition: A | Discharge status: Home / Self Care | Payer: No Typology Code available for payment source | Source: Intra-hospital | Attending: Psychiatry | Admitting: Psychiatry

## 2016-06-15 DIAGNOSIS — R45851 Suicidal ideations: Secondary | ICD-10-CM | POA: Diagnosis present

## 2016-06-15 DIAGNOSIS — F332 Major depressive disorder, recurrent severe without psychotic features: Secondary | ICD-10-CM | POA: Diagnosis present

## 2016-06-15 DIAGNOSIS — R569 Unspecified convulsions: Secondary | ICD-10-CM | POA: Diagnosis not present

## 2016-06-15 DIAGNOSIS — F17210 Nicotine dependence, cigarettes, uncomplicated: Secondary | ICD-10-CM | POA: Diagnosis present

## 2016-06-15 DIAGNOSIS — Z590 Homelessness: Secondary | ICD-10-CM | POA: Diagnosis not present

## 2016-06-15 DIAGNOSIS — Z825 Family history of asthma and other chronic lower respiratory diseases: Secondary | ICD-10-CM | POA: Diagnosis not present

## 2016-06-15 DIAGNOSIS — Z801 Family history of malignant neoplasm of trachea, bronchus and lung: Secondary | ICD-10-CM

## 2016-06-15 DIAGNOSIS — Z79899 Other long term (current) drug therapy: Secondary | ICD-10-CM | POA: Diagnosis not present

## 2016-06-15 DIAGNOSIS — F10239 Alcohol dependence with withdrawal, unspecified: Secondary | ICD-10-CM | POA: Diagnosis present

## 2016-06-15 DIAGNOSIS — F419 Anxiety disorder, unspecified: Secondary | ICD-10-CM | POA: Diagnosis present

## 2016-06-15 DIAGNOSIS — F1024 Alcohol dependence with alcohol-induced mood disorder: Principal | ICD-10-CM | POA: Diagnosis present

## 2016-06-15 DIAGNOSIS — Z811 Family history of alcohol abuse and dependence: Secondary | ICD-10-CM

## 2016-06-15 DIAGNOSIS — K7010 Alcoholic hepatitis without ascites: Secondary | ICD-10-CM | POA: Diagnosis present

## 2016-06-15 DIAGNOSIS — Z720 Tobacco use: Secondary | ICD-10-CM | POA: Diagnosis not present

## 2016-06-15 DIAGNOSIS — F1094 Alcohol use, unspecified with alcohol-induced mood disorder: Secondary | ICD-10-CM | POA: Diagnosis present

## 2016-06-15 DIAGNOSIS — I10 Essential (primary) hypertension: Secondary | ICD-10-CM | POA: Diagnosis not present

## 2016-06-15 DIAGNOSIS — Z833 Family history of diabetes mellitus: Secondary | ICD-10-CM | POA: Diagnosis not present

## 2016-06-15 LAB — ETHANOL: Alcohol, Ethyl (B): 114 mg/dL — ABNORMAL HIGH

## 2016-06-15 MED ORDER — LORAZEPAM 1 MG PO TABS
1.0000 mg | ORAL_TABLET | Freq: Two times a day (BID) | ORAL | Status: DC
  Filled 2016-06-15: qty 1

## 2016-06-15 MED ORDER — ENSURE ENLIVE PO LIQD
237.0000 mL | Freq: Two times a day (BID) | ORAL | Status: DC
  Administered 2016-06-16 – 2016-06-23 (×11): 237 mL via ORAL

## 2016-06-15 MED ORDER — LORAZEPAM 1 MG PO TABS
0.0000 mg | ORAL_TABLET | Freq: Four times a day (QID) | ORAL | Status: DC
  Administered 2016-06-15: 1 mg via ORAL
  Administered 2016-06-15 (×3): 2 mg via ORAL
  Filled 2016-06-15: qty 1
  Filled 2016-06-15 (×3): qty 2

## 2016-06-15 MED ORDER — LORAZEPAM 1 MG PO TABS
1.0000 mg | ORAL_TABLET | Freq: Four times a day (QID) | ORAL | Status: AC | PRN
  Administered 2016-06-16: 1 mg via ORAL
  Filled 2016-06-15: qty 1

## 2016-06-15 MED ORDER — HYDROXYZINE HCL 25 MG PO TABS: 25.0000 mg | ORAL_TABLET | Freq: Four times a day (QID) | ORAL | Status: DC | PRN

## 2016-06-15 MED ORDER — ACETAMINOPHEN 325 MG PO TABS: 650.0000 mg | ORAL_TABLET | Freq: Four times a day (QID) | ORAL | Status: DC | PRN

## 2016-06-15 MED ORDER — NICOTINE POLACRILEX 2 MG MT GUM: 2.0000 mg | CHEWING_GUM | OROMUCOSAL | Status: DC | PRN

## 2016-06-15 MED ORDER — THIAMINE HCL 100 MG/ML IJ SOLN: 100.0000 mg | Freq: Once | INTRAMUSCULAR | Status: DC

## 2016-06-15 MED ORDER — LORAZEPAM 1 MG PO TABS
2.0000 mg | ORAL_TABLET | Freq: Once | ORAL | Status: AC
  Administered 2016-06-15: 2 mg via ORAL
  Filled 2016-06-15: qty 2

## 2016-06-15 MED ORDER — PROMETHAZINE HCL 25 MG/ML IJ SOLN
25.0000 mg | Freq: Once | INTRAMUSCULAR | Status: AC
  Administered 2016-06-15: 25 mg via INTRAMUSCULAR
  Filled 2016-06-15: qty 1

## 2016-06-15 MED ORDER — LORAZEPAM 1 MG PO TABS
1.0000 mg | ORAL_TABLET | Freq: Four times a day (QID) | ORAL | Status: AC
  Administered 2016-06-16 – 2016-06-17 (×5): 1 mg via ORAL
  Filled 2016-06-15 (×5): qty 1

## 2016-06-15 MED ORDER — ALUM & MAG HYDROXIDE-SIMETH 200-200-20 MG/5ML PO SUSP: 30.0000 mL | ORAL | Status: DC | PRN

## 2016-06-15 MED ORDER — LOPERAMIDE HCL 2 MG PO CAPS: 2.0000 mg | ORAL_CAPSULE | ORAL | Status: AC | PRN

## 2016-06-15 MED ORDER — GABAPENTIN 300 MG PO CAPS
300.0000 mg | ORAL_CAPSULE | ORAL | Status: DC
  Administered 2016-06-16: 300 mg via ORAL
  Filled 2016-06-15 (×8): qty 1

## 2016-06-15 MED ORDER — LORAZEPAM 1 MG PO TABS: 0.0000 mg | ORAL_TABLET | Freq: Two times a day (BID) | ORAL | Status: DC

## 2016-06-15 MED ORDER — VITAMIN B-1 100 MG PO TABS
100.0000 mg | ORAL_TABLET | Freq: Every day | ORAL | Status: DC
  Administered 2016-06-16 – 2016-06-23 (×8): 100 mg via ORAL
  Filled 2016-06-15 (×11): qty 1

## 2016-06-15 MED ORDER — FLUOXETINE HCL 20 MG PO CAPS
20.0000 mg | ORAL_CAPSULE | Freq: Every day | ORAL | Status: DC
  Administered 2016-06-16: 20 mg via ORAL
  Filled 2016-06-15 (×4): qty 1

## 2016-06-15 MED ORDER — ONDANSETRON 4 MG PO TBDP: 4.0000 mg | ORAL_TABLET | Freq: Four times a day (QID) | ORAL | Status: AC | PRN

## 2016-06-15 MED ORDER — GABAPENTIN 300 MG PO CAPS
300.0000 mg | ORAL_CAPSULE | Freq: Three times a day (TID) | ORAL | Status: DC
  Administered 2016-06-15 (×3): 300 mg via ORAL
  Filled 2016-06-15 (×3): qty 1

## 2016-06-15 MED ORDER — MAGNESIUM HYDROXIDE 400 MG/5ML PO SUSP
30.0000 mL | Freq: Every day | ORAL | Status: DC | PRN
Start: 2016-06-15 — End: 2016-06-23

## 2016-06-15 MED ORDER — LORAZEPAM 1 MG PO TABS
1.0000 mg | ORAL_TABLET | Freq: Three times a day (TID) | ORAL | Status: AC
  Administered 2016-06-17 – 2016-06-18 (×3): 1 mg via ORAL
  Filled 2016-06-15 (×3): qty 1

## 2016-06-15 MED ORDER — TRAZODONE HCL 50 MG PO TABS
50.0000 mg | ORAL_TABLET | Freq: Every evening | ORAL | Status: DC | PRN
  Filled 2016-06-15 (×4): qty 1

## 2016-06-15 MED ORDER — ADULT MULTIVITAMIN W/MINERALS CH
1.0000 | ORAL_TABLET | Freq: Every day | ORAL | Status: DC
  Administered 2016-06-16 – 2016-06-23 (×8): 1 via ORAL
  Filled 2016-06-15 (×11): qty 1

## 2016-06-15 MED ORDER — LORAZEPAM 1 MG PO TABS: 1.0000 mg | ORAL_TABLET | Freq: Every day | ORAL | Status: DC

## 2016-06-15 MED ORDER — FLUOXETINE HCL 20 MG PO CAPS
20.0000 mg | ORAL_CAPSULE | Freq: Every day | ORAL | Status: DC
  Administered 2016-06-15: 20 mg via ORAL
  Filled 2016-06-15: qty 1

## 2016-06-15 NOTE — ED Notes (Signed)
Pt sleeping at present, no distress noted, calm & cooperative, monitoring for safety, Q 15 min checks in effect.

## 2016-06-15 NOTE — BH Assessment (Signed)
[facility] Assessment Progress Note  Per Corena Pilgrim, MD, this pt requires psychiatric hospitalization at this time.  [name], RN, [facility] anticipates that a bed will be available for this pt some time after 21:00, and has pre-assigned pt to [facility] Rm 303-1.  Pt has signed Voluntary Admission and Consent for Treatment, as well as Consent to Release Information to no one, and signed forms have been faxed to [facility].  Pt's nurse, Nena Jordan, has been notified, and agrees to send original paperwork along with pt via [facility], and to call report to [phone] when the time comes.  Jalene Mullet, [place] Triage Specialist [phone]

## 2016-06-15 NOTE — ED Notes (Signed)
Pt presents with SI, plan to drink self to death or cut self with knife.  Pt abuses alcohol and admits to homelessness.  A&O x 3, no distress noted, calm & cooperative.  Monitoring for safety, Q 15 min checks in effect.  Safety check for contraband completed, no items found.

## 2016-06-15 NOTE — ED Notes (Signed)
Report called to [place], [facility].  Pending [facility] transport.

## 2016-06-15 NOTE — Progress Notes (Signed)
06/15/16 1349:  LRT introduced self and offered activities, pt declined.  Victorino Sparrow, LRT/CTRS

## 2016-06-15 NOTE — ED Notes (Signed)
Md informed of elevated CIWA.  Pt is very shaky and sweaty.  Pt vomited once.  Pt contracts for safety.  15 minute checks and video monitoring in place.

## 2016-06-15 NOTE — BH Assessment (Signed)
Tele Assessment Note   Barry Moore is an 51 y.o. male.  -Clinician reviewed note by Dr. Jeneen Rinks.  Presents via EMS with an episode of shaking. Not seizure activity per EMS she was awake during his episode of shaking. States he was suicidal. He drinks daily. States that he wants to kill himself by using a knife.  Patient's friend called EMS because patient was having shaking episode.  Patient says that he has been feeling suicidal.  He drinks heavily and is homeless.  He says "I just want to give up."  Patient says that he does drink heavily but cannot quantify how much.  Patient had to have his pocket knife taken away because he said he would cut his wrists with it.    Patient says he wishes to hurt people that may make fun of him or be hurtful to him.  "I would like to beat the crap out of them."  He has no particular person he wishes to kill.  Patient denies any A/V hallucinations.  Patient is was at [facility] in December '17.  He has no current outpatient provider.  Patient is interested in getting help for his detox.  He reports having seizures but cannot say if it is an underlying condition or if he experiences them with detox.  -Clinician discussed patient care with Patriciaann Clan, PA who recommends inpatient care.  Patient discussed with Dr. Jeneen Rinks also who favors inpatient care.  TTS to seek placement as there are no appropriate beds at [facility].  Diagnosis: ETOH use d/o severe; MDD, recurrent severe  Past Medical History:  Past Medical History:  Diagnosis Date  . Alcoholic hepatitis   . Depression   . ETOH abuse   . Hypertension   . Seizures ([place])     Past Surgical History:  Procedure Laterality Date  . HERNIA REPAIR    . LEG SURGERY      Family History:  Family History  Problem Relation Age of Onset  . Diabetes Mother   . Alcoholism Mother   . Emphysema Father   . Lung cancer Father   . Alcoholism Father     Social History:  reports that he has been smoking Cigarettes.   He has been smoking about 2.00 packs per day. He has never used smokeless tobacco. He reports that he drinks alcohol. He reports that he does not use drugs.  Additional Social History:  Alcohol / Drug Use Pain Medications: See PTA medication list Prescriptions: See PTA medication list Over the Counter: See PTA medication list History of alcohol / drug use?: Yes Withdrawal Symptoms: Agitation, Nausea / Vomiting, Tremors, Irritability, Fever / Chills, Sweats, Seizures, Weakness, Patient aware of relationship between substance abuse and physical/medical complications Onset of Seizures: Pt says he does not know if it purely ETOH withdrawal induced. Date of most recent seizure: Pt says they are frequent. Substance #1 Name of Substance 1: ETOH 1 - Age of First Use: Teens 1 - Amount (size/oz): "I don't know, as much as I can get." 1 - Frequency: Daily 1 - Duration: ongoing 1 - Last Use / Amount: Pt can't recall.  His BAL was 436 at 16:39.  CIWA: CIWA-Ar BP: (!) 142/87 Pulse Rate: 97 Nausea and Vomiting: mild nausea with no vomiting Tactile Disturbances: mild itching, pins and needles, burning or numbness Tremor: moderate, with patient's arms extended Auditory Disturbances: very mild harshness or ability to frighten Paroxysmal Sweats: two Visual Disturbances: not present Anxiety: mildly anxious Headache, Fullness in Head:  severe Agitation: normal activity Orientation and Clouding of Sensorium: oriented and can do serial additions CIWA-Ar Total: 16 COWS:    PATIENT STRENGTHS: (choose at least two) Ability for insight Average or above average intelligence Capable of independent living Communication skills  Allergies:  Allergies  Allergen Reactions  . Aspirin Nausea And Vomiting    Home Medications:  (Not in a hospital admission)  OB/GYN Status:  No LMP for male patient.  General Assessment Data Location of Assessment: [facility] TTS Assessment: In system Is this a Tele or  Face-to-Face Assessment?: Face-to-Face Is this an Initial Assessment or a Re-assessment for this encounter?: Initial Assessment Marital status: Single Is patient pregnant?: No Pregnancy Status: No Living Arrangements: Other (Comment) (Pt is homeless.) Can pt return to current living arrangement?: Yes Admission Status: Voluntary Is patient capable of signing voluntary admission?: Yes Referral Source: Self/Family/Friend (A friend called EMS.) Insurance type: self pay     Crisis Care Plan Living Arrangements: Other (Comment) (Pt is homeless.) Name of Psychiatrist: None Name of Therapist: None  Education Status Is patient currently in school?: No Highest grade of school patient has completed: 11th grade  Risk to self with the past 6 months Suicidal Ideation: Yes-Currently Present Has patient been a risk to self within the past 6 months prior to admission? : Yes Suicidal Intent: Yes-Currently Present Has patient had any suicidal intent within the past 6 months prior to admission? : Yes Is patient at risk for suicide?: Yes Suicidal Plan?: Yes-Currently Present Has patient had any suicidal plan within the past 6 months prior to admission? : Yes Specify Current Suicidal Plan: Cut his wrists Access to Means: Yes Specify Access to Suicidal Means: Had a knive. What has been your use of drugs/alcohol within the last 12 months?: ETOH Previous Attempts/Gestures: Yes How many times?: 2 Other Self Harm Risks: No Triggers for Past Attempts: Unpredictable Intentional Self Injurious Behavior: None Family Suicide History: No Recent stressful life event(s): Other (Comment), Financial Problems (Homelessness) Persecutory voices/beliefs?: Yes Depression: Yes Depression Symptoms: Despondent, Insomnia, Isolating, Loss of interest in usual pleasures, Feeling worthless/self pity Substance abuse history and/or treatment for substance abuse?: Yes Suicide prevention information given to non-admitted  patients: Not applicable  Risk to Others within the past 6 months Homicidal Ideation: No Does patient have any lifetime risk of violence toward others beyond the six months prior to admission? : No Thoughts of Harm to Others: Yes-Currently Present Comment - Thoughts of Harm to Others: Hit people that may make fun of him, etc. Current Homicidal Intent: No Current Homicidal Plan: No Access to Homicidal Means: No Identified Victim: No one History of harm to others?: No Assessment of Violence: None Noted Violent Behavior Description: None reported Does patient have access to weapons?: No Criminal Charges Pending?: Yes Describe Pending Criminal Charges: In [place], none in [place] Does patient have a court date: No Is patient on probation?: No  Psychosis Hallucinations: None noted Delusions: None noted  Mental Status Report Appearance/Hygiene: Disheveled, In scrubs Eye Contact: Good Motor Activity: Freedom of movement, Unremarkable Speech: Logical/coherent Level of Consciousness: Alert Mood: Depressed Affect: Depressed, Sad Anxiety Level: None Thought Processes: Coherent, Relevant Judgement: Impaired Orientation: Appropriate for developmental age Obsessive Compulsive Thoughts/Behaviors: None  Cognitive Functioning Concentration: Decreased Memory: Recent Intact, Remote Intact IQ: Average Insight: Fair Impulse Control: Poor Appetite: Good Weight Loss: 0 Weight Gain: 0 Sleep: Decreased Total Hours of Sleep:  (<4H/D) Vegetative Symptoms: None  ADLScreening [facility] Assessment Services) Patient's cognitive ability adequate to safely complete  daily activities?: Yes Patient able to express need for assistance with ADLs?: Yes Independently performs ADLs?: Yes (appropriate for developmental age)  Prior Inpatient Therapy Prior Inpatient Therapy: Yes Prior Therapy Dates: Dec 2017 Prior Therapy Facilty/Provider(s): [facility] Reason for Treatment: SI/SA  Prior Outpatient Therapy Prior  Outpatient Therapy: No Prior Therapy Dates: N/A Prior Therapy Facilty/Provider(s): N/A Reason for Treatment: N/A Does patient have an ACCT team?: No Does patient have Intensive In-House Services?  : No Does patient have [facility] services? : No Does patient have P4CC services?: No  ADL Screening (condition at time of admission) Patient's cognitive ability adequate to safely complete daily activities?: Yes Is the patient deaf or have difficulty hearing?: No Does the patient have difficulty seeing, even when wearing glasses/contacts?: No Does the patient have difficulty concentrating, remembering, or making decisions?: Yes Patient able to express need for assistance with ADLs?: Yes Does the patient have difficulty dressing or bathing?: No Independently performs ADLs?: Yes (appropriate for developmental age) Does the patient have difficulty walking or climbing stairs?: Yes (Pt says he fell three times last week for no reason.) Weakness of Legs: Both Weakness of Arms/Hands: None       Abuse/Neglect Assessment (Assessment to be complete while patient is alone) Physical Abuse: Denies Verbal Abuse: Denies Sexual Abuse: Denies Exploitation of patient/patient's resources: Denies Self-Neglect: Denies     Regulatory affairs officer (For Healthcare) Does Patient Have a Medical Advance Directive?: No    Additional Information 1:1 In Past 12 Months?: No CIRT Risk: No Elopement Risk: No Does patient have medical clearance?: Yes     Disposition:  Disposition Initial Assessment Completed for this Encounter: Yes Disposition of Patient: Other dispositions Other disposition(s): Other (Comment) (Pt to be reviewed with PA)  Curlene Dolphin Ray 06/15/2016 12:15 AM

## 2016-06-16 DIAGNOSIS — I10 Essential (primary) hypertension: Secondary | ICD-10-CM

## 2016-06-16 DIAGNOSIS — Z79899 Other long term (current) drug therapy: Secondary | ICD-10-CM

## 2016-06-16 DIAGNOSIS — R569 Unspecified convulsions: Secondary | ICD-10-CM

## 2016-06-16 DIAGNOSIS — Z811 Family history of alcohol abuse and dependence: Secondary | ICD-10-CM

## 2016-06-16 DIAGNOSIS — Z590 Homelessness: Secondary | ICD-10-CM

## 2016-06-16 MED ORDER — TRAZODONE HCL 50 MG PO TABS
50.0000 mg | ORAL_TABLET | Freq: Every evening | ORAL | Status: DC | PRN
  Administered 2016-06-16 – 2016-06-17 (×2): 50 mg via ORAL
  Filled 2016-06-16: qty 1

## 2016-06-16 MED ORDER — GABAPENTIN 100 MG PO CAPS
100.0000 mg | ORAL_CAPSULE | Freq: Three times a day (TID) | ORAL | Status: DC
  Administered 2016-06-16 – 2016-06-21 (×15): 100 mg via ORAL
  Filled 2016-06-16 (×23): qty 1

## 2016-06-16 MED ORDER — CITALOPRAM HYDROBROMIDE 10 MG PO TABS
10.0000 mg | ORAL_TABLET | Freq: Every day | ORAL | Status: DC
  Administered 2016-06-17 – 2016-06-21 (×5): 10 mg via ORAL
  Filled 2016-06-16 (×9): qty 1

## 2016-06-16 NOTE — Tx Team (Signed)
Interdisciplinary Treatment and Diagnostic Plan Update 06/16/2016 Time of Session: 9:30am  Barry Moore  MRN: [id number]  Principal Diagnosis: Alcohol-induced mood disorder ([place])  Secondary Diagnoses: Principal Problem:   Alcohol-induced mood disorder ([place])   Current Medications:  Current Facility-Administered Medications  Medication Dose Route Frequency Provider Last Rate Last Dose  . alum & mag hydroxide-simeth (MAALOX/MYLANTA) 200-200-20 MG/5ML suspension 30 mL  30 mL Oral Q4H PRN Laverle Hobby, PA-C      . [START ON 06/17/2016] citalopram (CELEXA) tablet 10 mg  10 mg Oral Daily Fernando A Cobos, MD      . feeding supplement (ENSURE ENLIVE) (ENSURE ENLIVE) liquid 237 mL  237 mL Oral BID BM Laverle Hobby, PA-C   237 mL at 06/16/16 0942  . gabapentin (NEURONTIN) capsule 100 mg  100 mg Oral TID Jenne Campus, MD      . loperamide (IMODIUM) capsule 2-4 mg  2-4 mg Oral PRN Laverle Hobby, PA-C      . LORazepam (ATIVAN) tablet 1 mg  1 mg Oral Q6H PRN Laverle Hobby, PA-C      . LORazepam (ATIVAN) tablet 1 mg  1 mg Oral QID Laverle Hobby, PA-C   1 mg at 06/16/16 1221   Followed by  . [START ON 06/17/2016] LORazepam (ATIVAN) tablet 1 mg  1 mg Oral TID Laverle Hobby, PA-C       Followed by  . [START ON 06/18/2016] LORazepam (ATIVAN) tablet 1 mg  1 mg Oral BID Laverle Hobby, PA-C       Followed by  . [START ON 06/20/2016] LORazepam (ATIVAN) tablet 1 mg  1 mg Oral Daily Spencer E Simon, PA-C      . magnesium hydroxide (MILK OF MAGNESIA) suspension 30 mL  30 mL Oral Daily PRN Laverle Hobby, PA-C      . multivitamin with minerals tablet 1 tablet  1 tablet Oral Daily Laverle Hobby, PA-C   1 tablet at 06/16/16 0941  . nicotine polacrilex (NICORETTE) gum 2 mg  2 mg Oral PRN Laverle Hobby, PA-C      . ondansetron (ZOFRAN-ODT) disintegrating tablet 4 mg  4 mg Oral Q6H PRN Laverle Hobby, PA-C      . thiamine (B-1) injection 100 mg  100 mg Intramuscular Once [employer], PA-C       . thiamine (VITAMIN B-1) tablet 100 mg  100 mg Oral Daily Laverle Hobby, PA-C   100 mg at 06/16/16 0941  . traZODone (DESYREL) tablet 50 mg  50 mg Oral QHS PRN Jenne Campus, MD        PTA Medications: Prescriptions Prior to Admission  Medication Sig Dispense Refill Last Dose  . benztropine (COGENTIN) 0.5 MG tablet Take 1 tablet (0.5 mg total) by mouth 2 (two) times daily. (Patient not taking: Reported on 05/26/2016) 60 tablet 0 Not Taking at Unknown time  . chlordiazePOXIDE (LIBRIUM) 5 MG capsule Take 1 capsule (5 mg total) by mouth 3 (three) times daily as needed for anxiety or withdrawal. (Patient not taking: Reported on 06/14/2016) 10 capsule 0 Not Taking at Unknown time  . FLUoxetine (PROZAC) 20 MG capsule Take 1 capsule (20 mg total) by mouth daily. (Patient not taking: Reported on 05/26/2016) 30 capsule 0 Not Taking at Unknown time  . gabapentin (NEURONTIN) 300 MG capsule Take 1 capsule (300 mg total) by mouth 3 (three) times daily at 8am, 3pm and bedtime. (Patient not taking: Reported on 05/26/2016)  90 capsule 0 Not Taking at Unknown time  . haloperidol (HALDOL) 5 MG tablet Take 1 tablet (5 mg total) by mouth 2 (two) times daily. (Patient not taking: Reported on 05/26/2016) 60 tablet 0 Not Taking at Unknown time  . hydrOXYzine (ATARAX/VISTARIL) 25 MG tablet Take 1 tablet (25 mg total) by mouth every 6 (six) hours as needed (anxiety/agitation or CIWA < or = 10). (Patient not taking: Reported on 05/26/2016) 30 tablet 0 Not Taking at Unknown time  . mirtazapine (REMERON) 30 MG tablet Take 1 tablet (30 mg total) by mouth at bedtime. (Patient not taking: Reported on 05/26/2016) 30 tablet 0 Not Taking at Unknown time  . Multiple Vitamin (MULTIVITAMIN) capsule Take 1 capsule by mouth daily. (Patient not taking: Reported on 06/14/2016) 30 capsule 0 Not Taking at Unknown time  . nicotine polacrilex (NICORETTE) 2 MG gum Take 1 each (2 mg total) by mouth as needed for smoking cessation. (Patient not taking:  Reported on 05/26/2016) 100 tablet 0 Not Taking at Unknown time    Treatment Modalities: Medication Management, Group therapy, Case management,  1 to 1 session with clinician, Psychoeducation, Recreational therapy.  Patient Stressors: Financial difficulties Loss of relationship with girlfriend Medication change or noncompliance Substance abuse Patient Strengths: Ability for insight Average or above average intelligence Curator fund of knowledge  Physician Treatment Plan for Primary Diagnosis: Alcohol-induced mood disorder ([place]) Long Term Goal(s): Improvement in symptoms so as ready for discharge Short Term Goals: Ability to verbalize feelings will improve Ability to disclose and discuss suicidal ideas Ability to demonstrate self-control will improve Ability to identify and develop effective coping behaviors will improve Ability to maintain clinical measurements within normal limits will improve Ability to identify triggers associated with substance abuse/mental health issues will improve  Medication Management: Evaluate patient's response, side effects, and tolerance of medication regimen.  Therapeutic Interventions: 1 to 1 sessions, Unit Group sessions and Medication administration.  Evaluation of Outcomes: Not Met  Physician Treatment Plan for Secondary Diagnosis: Principal Problem:   Alcohol-induced mood disorder ([place])  Long Term Goal(s): Improvement in symptoms so as ready for discharge  Short Term Goals: Ability to verbalize feelings will improve Ability to disclose and discuss suicidal ideas Ability to demonstrate self-control will improve Ability to identify and develop effective coping behaviors will improve Ability to maintain clinical measurements within normal limits will improve Ability to identify triggers associated with substance abuse/mental health issues will improve  Medication Management: Evaluate patient's response, side effects, and  tolerance of medication regimen.  Therapeutic Interventions: 1 to 1 sessions, Unit Group sessions and Medication administration.  Evaluation of Outcomes: Not Met  RN Treatment Plan for Primary Diagnosis: Alcohol-induced mood disorder ([place]) Long Term Goal(s): Knowledge of disease and therapeutic regimen to maintain health will improve  Short Term Goals: Ability to disclose and discuss suicidal ideas and Compliance with prescribed medications will improve  Medication Management: RN will administer medications as ordered by provider, will assess and evaluate patient's response and provide education to patient for prescribed medication. RN will report any adverse and/or side effects to prescribing provider.  Therapeutic Interventions: 1 on 1 counseling sessions, Psychoeducation, Medication administration, Evaluate responses to treatment, Monitor vital signs and CBGs as ordered, Perform/monitor CIWA, COWS, AIMS and Fall Risk screenings as ordered, Perform wound care treatments as ordered.  Evaluation of Outcomes: Not Met  LCSW Treatment Plan for Primary Diagnosis: Alcohol-induced mood disorder ([place]) Long Term Goal(s): Safe transition to appropriate next level of care at discharge, Engage  patient in therapeutic group addressing interpersonal concerns. Short Term Goals: Engage patient in aftercare planning with referrals and resources, Facilitate patient progression through stages of change regarding substance use diagnoses and concerns, Identify triggers associated with mental health/substance abuse issues and Increase skills for wellness and recovery  Therapeutic Interventions: Assess for all discharge needs, 1 to 1 time with Social worker, Explore available resources and support systems, Assess for adequacy in community support network, Educate family and significant other(s) on suicide prevention, Complete Psychosocial Assessment, Interpersonal group therapy.  Evaluation of Outcomes: Not  Met  Progress in Treatment: Attending groups: Pt is new to milieu, continuing to assess  Participating in groups: Pt is new to milieu, continuing to assess  Taking medication as prescribed: Yes, MD continues to assess for medication changes as needed Toleration medication: Yes, no side effects reported at this time Family/Significant other contact made: No, CSW assessing for appropriate contact Patient understands diagnosis: Continuing to assess Discussing patient identified problems/goals with staff: Yes Medical problems stabilized or resolved: Yes Denies suicidal/homicidal ideation: No, pt recently admitted with suicidal ideation. Issues/concerns per patient self-inventory: None Other: N/A  New problem(s) identified: None identified at this time.   New Short Term/Long Term Goal(s): None identified at this time.   Discharge Plan or Barriers: Pt plans to discharge to [facility] or [facility].  Reason for Continuation of Hospitalization:  Anxiety  Depression Medication stabilization Suicidal ideation Withdrawal symptoms  Estimated Length of Stay: 3-5 days  Attendees: Patient: 06/16/2016 3:19 PM  Physician: Dr. Parke Poisson 06/16/2016 3:19 PM  Nursing: Mortimer Fries RN; [place], RN 06/16/2016 3:19 PM  RN Care Manager: Lars Pinks, RN 06/16/2016 3:19 PM  Social Worker: Matthew Saras, [place] 06/16/2016 3:19 PM  Recreational Therapist:  06/16/2016 3:19 PM  Other: Lindell Spar, NP; Samuel Jester, NP 06/16/2016 3:19 PM  Other:  06/16/2016 3:19 PM  Other: 06/16/2016 3:19 PM   Scribe for Treatment Team: Georga Kaufmann, MSW,LCSWA 06/16/2016 3:19 PM

## 2016-06-16 NOTE — Progress Notes (Signed)
D: Patient continues to detox from alcohol.  His CIWA score is over a 10 this afternoon.  He was given prn ativan and will be given another dose at 1700.  Patient has eaten small portions of food throughout the day and drank 2 ensures.  Fluids have been encouraged.  Patient is sweating profusely and tremulous.  He rates his depression and hopelessness; anxiety as a 5.  Patient has been sleeping throughout the day.  He reports withdrawal symptoms as tremors, runny nose, chilling and irritability.  His meals and medications have been brought to him today due to his unsteadiness.  Patient's goal today is to "stay alive and try to feel better."  Patient was able to bathe in the bathtub this afternoon.  His hygiene is extremely poor and he has a no roommate order for same.  He denies any thoughts of self harm. A: Continue to monitor withdrawal symptoms and medicate as needed.  Safety checks completed every 15 minutes per protocol.  Offer support and encouragement as needed. R: Patient is receptive to staff; his behavior is appropriate.

## 2016-06-16 NOTE — Progress Notes (Signed)
Recreation Therapy Notes  Date: 06/16/16 Time: 0930 Location: 300 Hall Dayroom  Group Topic: Stress Management  Goal Area(s) Addresses:  Patient will verbalize importance of using healthy stress management.  Patient will identify positive emotions associated with healthy stress management.   Intervention: Stress Management  Activity :  Body Scan Meditation.  LRT introduced the stress management technique of meditation.  LRT played a meditation that allowed patients to focus on the feelings and sensations they were feeling.  Patients were to follow along as the meditation played to engage in the activity.  Education:  Stress Management, Discharge Planning.   Education Outcome: Acknowledges edcuation/In group clarification offered/Needs additional education  Clinical Observations/Feedback: Pt did not attend group.   Victorino Sparrow, LRT/CTRS         Victorino Sparrow A 06/16/2016 12:26 PM

## 2016-06-16 NOTE — BHH Suicide Risk Assessment (Signed)
[facility] Admission Suicide Risk Assessment   Nursing information obtained from:   patient and chart  Demographic factors:   homelessness, unemployment, limited support system Current Mental Status:   see below  Loss Factors:   death of friend recently, homelessness  Historical Factors:   depression, alcohol dependence, severe  Risk Reduction Factors:   resilience   Total Time spent with patient: 45 minutes Principal Problem:  Alcohol Dependence, Alcohol Induced Mood Disorder  Diagnosis:     Continued Clinical Symptoms:  Alcohol Use Disorder Identification Test Final Score (AUDIT): 37 The "Alcohol Use Disorders Identification Test", Guidelines for Use in Primary Care, Second Edition.  World Pharmacologist [facility]). Score between 0-7:  no or low risk or alcohol related problems. Score between 8-15:  moderate risk of alcohol related problems. Score between 16-19:  high risk of alcohol related problems. Score 20 or above:  warrants further diagnostic evaluation for alcohol dependence and treatment.   CLINICAL FACTORS:  51 year old male, currently homeless. History of alcohol dependence, drinking daily and heavily up to 3/26.  Admitted due to symptoms of WDL and depression .      Psychiatric Specialty Exam: Physical Exam  ROS  Blood pressure (!) 141/97, pulse 81, temperature 98.5 F (36.9 C), temperature source Oral, resp. rate 18, height 5\' 7"  (1.702 m), weight 74.8 kg (165 lb), SpO2 97 %.Body mass index is 25.84 kg/m.   see admit note MSE   COGNITIVE FEATURES THAT CONTRIBUTE TO RISK:  Closed-mindedness and Loss of executive function    SUICIDE RISK:   Moderate:  Frequent suicidal ideation with limited intensity, and duration, some specificity in terms of plans, no associated intent, good self-control, limited dysphoria/symptomatology, some risk factors present, and identifiable protective factors, including available and accessible social support.  PLAN OF CARE: Patient will  be admitted to inpatient psychiatric unit for stabilization and safety. Will provide and encourage milieu participation. Provide medication management and maked adjustments as needed. Will also provide medications to minimize risk of WDL symptoms.  Will follow daily.    I certify that inpatient services furnished can reasonably be expected to improve the patient's condition.   Jenne Campus, MD 06/16/2016, 2:09 PM

## 2016-06-16 NOTE — BHH Group Notes (Signed)
[place] LCSW Group Therapy 06/16/2016 1:15 PM  Type of Therapy: Group Therapy- Emotion Regulation  Participation Level: Pt invited. Did not attend.  Georga Kaufmann, MSW, LCSWA 06/16/2016 3:56 PM

## 2016-06-16 NOTE — Progress Notes (Signed)
NUTRITION ASSESSMENT  Pt identified as at risk on the Malnutrition Screen Tool  INTERVENTION: 1. Educated patient on the importance of nutrition and encouraged intake of food and beverages. 2. Discussed weight goals. 3. Supplements: continue Ensure Enlive BID, each supplement provides 350 kcal and 20 grams of protein. Continue daily multivitamin with minerals.   NUTRITION DIAGNOSIS: Unintentional weight loss related to sub-optimal intake as evidenced by pt report.   Goal: Pt to meet >/= 90% of their estimated nutrition needs.  Monitor:  PO intake  Assessment:  Pt admitted for SI with plan to "drink myself to death". Pt with hx of alcoholic cirrhosis but continues to drink a fifth of liquor and three 40s/day. He did have a prolonged period of sobriety from 2013-2016 during which time he was in jail.  Per review, weight has been fluctuating since September (160-178 lbs) which is likely 2/2 cirrhosis with ongoing alcohol abuse. He does report that recently he has had decreased appetite and PO intakes.   Ensure Jeanne Ivan has already been ordered BID. Continue to encourage PO intakes of meals, snacks, and supplement.     51 y.o. male  Height: Ht Readings from Last 1 Encounters:  06/15/16 5\' 7"  (1.702 m)    Weight: Wt Readings from Last 1 Encounters:  06/15/16 165 lb (74.8 kg)    Weight Hx: Wt Readings from Last 10 Encounters:  06/15/16 165 lb (74.8 kg)  05/26/16 170 lb (77.1 kg)  05/02/16 178 lb (80.7 kg)  03/05/16 164 lb (74.4 kg)  02/14/16 166 lb 7.2 oz (75.5 kg)  11/21/15 170 lb (77.1 kg)    BMI:  Body mass index is 25.84 kg/m. Pt meets criteria for overweight based on current BMI.  Estimated Nutritional Needs: Kcal: 25-30 kcal/kg Protein: > 1 gram protein/kg Fluid: 1 ml/kcal  Diet Order: Diet regular Room service appropriate? Yes; Fluid consistency: Thin Pt is also offered choice of unit snacks mid-morning and mid-afternoon.  Pt is eating as desired.   Lab  results and medications reviewed.     Jarome Matin, MS, RD, LDN, [facility] Inpatient Clinical Dietitian Pager # [phone] After hours/weekend pager # [phone]

## 2016-06-16 NOTE — Progress Notes (Signed)
Pt is a 51 year old male admitted to [facility] voluntarily for SI and substance abuse.  He reports he is here for "suicidal thoughts, I was trying to drink myself to death and I did have a knife."  Pt reports medical history of alcoholic hepatitis, seizures, alcohol abuse, depression, and hypertension.  He reports he drinks "a fifth to a fifth of liquor and three 40's" daily.  He reports his alcohol abuse is on-going and the longest period of sobriety he has had was between 2013 and 2016 "when I was in prison."  He reports he was in prison for "probation violation."  Pt is a high fall risk because he reports he has fallen multiple times recently.  Pt reports he has lost weight recently and has had decreased appetite.  Pt does report SI, denies HI, denies hallucinations, denies pain.  Pt denies history of abuse.  He reports he does not have a support system.    Introduced self to pt.  Admission process and paperwork completed with pt.  Actively listened to pt and offered support and encouragement.  Pt is tremulous and anxious.  On-site provider notified of CIWA score of 11 and Ativan 2 mg POX1 ordered and administered.  Non-invasive body assessment completed.  Pt has tattoos on both forearms and scar on right forearm.  Belongings searched for contraband.  Items not allowed on unit are in locker 26.  Pt oriented to unit and unit routine.  Pt provided with meal and beverage.  Fall prevention techniques reviewed with pt, pt verbalizes understanding.    Pt is safe on the unit.  He was cooperative with admission process.  He verbally contracts for safety and reports he will inform staff of needs and concerns.  Will continue to monitor and assess.
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
## 2016-06-16 NOTE — Progress Notes (Signed)
Per pt request, CSW sent a referral to [facility] via fax number [phone].  Georga Kaufmann, MSW, [place]

## 2016-06-16 NOTE — Tx Team (Signed)
Initial Treatment Plan 06/16/2016 12:17 AM Barry Moore EML:[id number]    PATIENT STRESSORS: Financial difficulties Loss of relationship with girlfriend Medication change or noncompliance Substance abuse   PATIENT STRENGTHS: Ability for insight Average or above average intelligence Communication skills General fund of knowledge   PATIENT IDENTIFIED PROBLEMS: "work on why I keep having these suicidal thoughts and get them to stop."    "drinking is gonna have to stop"    SI  depression  Substance abuse (alcohol)         DISCHARGE CRITERIA:  Improved stabilization in mood, thinking, and/or behavior Withdrawal symptoms are absent or subacute and managed without 24-hour nursing intervention  PRELIMINARY DISCHARGE PLAN: Attend 12-step recovery group Placement in alternative living arrangements  PATIENT/FAMILY INVOLVEMENT: This treatment plan has been presented to and reviewed with the patient, Barry Moore.  The patient and family have been given the opportunity to ask questions and make suggestions.  Karie Kirks, [place] 06/16/2016, 12:17 AM
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
## 2016-06-17 ENCOUNTER — Other Ambulatory Visit: Payer: Self-pay

## 2016-06-17 DIAGNOSIS — F1094 Alcohol use, unspecified with alcohol-induced mood disorder: Secondary | ICD-10-CM

## 2016-06-17 MED ORDER — ACAMPROSATE CALCIUM 333 MG PO TBEC
666.0000 mg | DELAYED_RELEASE_TABLET | Freq: Three times a day (TID) | ORAL | Status: DC
  Administered 2016-06-17 – 2016-06-23 (×18): 666 mg via ORAL
  Filled 2016-06-17 (×7): qty 2
  Filled 2016-06-17 (×2): qty 42
  Filled 2016-06-17 (×7): qty 2
  Filled 2016-06-17: qty 42
  Filled 2016-06-17 (×12): qty 2

## 2016-06-17 MED ORDER — AMLODIPINE BESYLATE 5 MG PO TABS
5.0000 mg | ORAL_TABLET | Freq: Every day | ORAL | Status: DC
  Administered 2016-06-17 – 2016-06-23 (×7): 5 mg via ORAL
  Filled 2016-06-17 (×3): qty 1
  Filled 2016-06-17: qty 7
  Filled 2016-06-17 (×8): qty 1

## 2016-06-17 NOTE — Progress Notes (Signed)
Patient ID: Barry Moore, male   DOB: Jan 29, 1966, 51 y.o.   MRN: [id number] D: Client visible on the unit for a short time then returns to room and plays solitaire. Client reports goal: "making it through the day" "tremors a little bit" depression "8" of 10. Client denies SHI. A: Writer provided emotional support, encouraged client to reports any concerns. Medications reviewed, administered as ordered. Staff will monitor q19min for safety. R: Client is safe on the unit, did not attend karaoke.

## 2016-06-17 NOTE — Progress Notes (Signed)
Pt has been accepted for admission to [facility] tomorrow at 11am. An [facility] representative will pick pt up from the hospital. Pt will need a 21 day supply of medications.   Georga Kaufmann, MSW, [place]

## 2016-06-17 NOTE — Progress Notes (Addendum)
[facility] MD Progress Note  06/17/2016 11:55 AM Barry Moore  MRN:  [id number] Subjective:  Patient reports feeling  " a little bit better today". Symptoms of alcohol withdrawal have been gradually improving . Today reports some ongoing anxiety, but acknowledges overall improvement. Still feels vaguely jittery and diaphoretic .  Denies medication side effects. Objective : I have discussed case with treatment team and have met with patient . Patient presents partially improved compared to yesterday, and at this time does not appear to be in any acute distress or discomfort . He has mild distal tremors, and is not psycho-motorically restless. Vitals are improved . Denies headache, denies visual disturbances.  No disruptive or agitated behaviors on unit. As he improves he is starting to be more visible on unit, participate a little more in milieu. We discussed medication issues- he has never been on Antabuse, Naltrexone, or Campral.    Principal Problem: Alcohol-induced mood disorder ([place]) Diagnosis:   Patient Active Problem List   Diagnosis Date Noted  . Alcohol-induced mood disorder ([place]) [F10.94] 06/15/2016  . Seizure ([place]) [R56.9] 05/26/2016  . Tobacco abuse [Z72.0] 05/26/2016  . Homeless [Z59.0] 05/26/2016  . Essential hypertension [I10] 05/26/2016  . MDD (major depressive disorder), recurrent severe, without psychosis ([place]) [F33.2] 03/06/2016  . Alcohol use disorder, severe, dependence ([place]) [F10.20] 02/14/2016  . Severe alcohol withdrawal without perceptual disturbances without complication ([place]) [M35.361] 02/14/2016  . Alcoholic hepatitis without ascites [K70.10] 02/14/2016  . Thrombocytopenia ([place]) [D69.6] 02/14/2016   Total Time spent with patient: 20 minutes    Past Medical History:  Past Medical History:  Diagnosis Date  . Alcoholic hepatitis   . Depression   . ETOH abuse   . Hypertension   . Seizures ([place])     Past Surgical History:  Procedure Laterality Date  . HERNIA  REPAIR    . LEG SURGERY     Family History:  Family History  Problem Relation Age of Onset  . Diabetes Mother   . Alcoholism Mother   . Emphysema Father   . Lung cancer Father   . Alcoholism Father    Social History:  History  Alcohol Use  . Yes    Comment: 5th liquor and one Forty daily       History  Drug Use No    Social History   Social History  . Marital status: Divorced    Spouse name: N/A  . Number of children: N/A  . Years of education: N/A   Social History Main Topics  . Smoking status: Current Every Day Smoker    Packs/day: 2.00    Types: Cigarettes  . Smokeless tobacco: Never Used  . Alcohol use Yes     Comment: 5th liquor and one Forty daily    . Drug use: No  . Sexual activity: Not Currently   Other Topics Concern  . None   Social History Narrative  . None   Additional Social History:    Pain Medications: denies Prescriptions: denies Over the Counter: denies History of alcohol / drug use?: Yes Longest period of sobriety (when/how long): "3 years when I was in prison" reports it was from 2013-2016 Negative Consequences of Use: Personal relationships, Museum/gallery curator, Work / School Withdrawal Symptoms: Agitation, Sweats, Seizures, Tremors (Hx of seizures) Name of Substance 1: ETOH 1 - Age of First Use: teens 1 - Amount (size/oz): 1/5 of liquor and 1/5 of liquor and (3) 40 oz. beers 1 - Frequency: daily 1 - Duration: ongoing 1 -  Last Use / Amount: yesterday  Sleep: improving   Appetite:  fair , improving   Current Medications: Current Facility-Administered Medications  Medication Dose Route Frequency Provider Last Rate Last Dose  . alum & mag hydroxide-simeth (MAALOX/MYLANTA) 200-200-20 MG/5ML suspension 30 mL  30 mL Oral Q4H PRN Laverle Hobby, PA-C      . citalopram (CELEXA) tablet 10 mg  10 mg Oral Daily Jenne Campus, MD   10 mg at 06/17/16 0815  . feeding supplement (ENSURE ENLIVE) (ENSURE ENLIVE) liquid 237 mL  237 mL Oral BID BM  Maurine Minister Simon, PA-C   237 mL at 06/16/16 1547  . gabapentin (NEURONTIN) capsule 100 mg  100 mg Oral TID Jenne Campus, MD   100 mg at 06/17/16 0815  . loperamide (IMODIUM) capsule 2-4 mg  2-4 mg Oral PRN Laverle Hobby, PA-C      . LORazepam (ATIVAN) tablet 1 mg  1 mg Oral Q6H PRN Laverle Hobby, PA-C   1 mg at 06/16/16 1547  . LORazepam (ATIVAN) tablet 1 mg  1 mg Oral QID Laverle Hobby, PA-C   1 mg at 06/17/16 0815   Followed by  . LORazepam (ATIVAN) tablet 1 mg  1 mg Oral TID Laverle Hobby, PA-C       Followed by  . [START ON 06/18/2016] LORazepam (ATIVAN) tablet 1 mg  1 mg Oral BID Laverle Hobby, PA-C       Followed by  . [START ON 06/20/2016] LORazepam (ATIVAN) tablet 1 mg  1 mg Oral Daily Spencer E Simon, PA-C      . magnesium hydroxide (MILK OF MAGNESIA) suspension 30 mL  30 mL Oral Daily PRN Laverle Hobby, PA-C      . multivitamin with minerals tablet 1 tablet  1 tablet Oral Daily Laverle Hobby, PA-C   1 tablet at 06/17/16 8921  . nicotine polacrilex (NICORETTE) gum 2 mg  2 mg Oral PRN Laverle Hobby, PA-C      . ondansetron (ZOFRAN-ODT) disintegrating tablet 4 mg  4 mg Oral Q6H PRN Laverle Hobby, PA-C      . thiamine (B-1) injection 100 mg  100 mg Intramuscular Once Spencer E Simon, PA-C      . thiamine (VITAMIN B-1) tablet 100 mg  100 mg Oral Daily Laverle Hobby, PA-C   100 mg at 06/17/16 0815  . traZODone (DESYREL) tablet 50 mg  50 mg Oral QHS PRN Jenne Campus, MD   50 mg at 06/16/16 2107    Lab Results: No results found for this or any previous visit (from the past 43 hour(s)).  Blood Alcohol level:  Lab Results  Component Value Date   ETH 114 (H) 06/15/2016   ETH 436 (HH) 19/41/7408    Metabolic Disorder Labs: Lab Results  Component Value Date   HGBA1C 5.2 03/07/2016   MPG 103 03/07/2016   Lab Results  Component Value Date   PROLACTIN 23.2 (H) 03/07/2016   Lab Results  Component Value Date   CHOL 215 (H) 03/07/2016   TRIG 82 03/07/2016    HDL 105 03/07/2016   CHOLHDL 2.0 03/07/2016   VLDL 16 03/07/2016   LDLCALC 94 03/07/2016    Physical Findings: AIMS: Facial and Oral Movements Muscles of Facial Expression: None, normal Lips and Perioral Area: None, normal Jaw: None, normal Tongue: None, normal,Extremity Movements Upper (arms, wrists, hands, fingers): Mild Lower (legs, knees, ankles, toes): None, normal, Trunk Movements Neck,  shoulders, hips: None, normal, Overall Severity Severity of abnormal movements (highest score from questions above): Minimal Incapacitation due to abnormal movements: None, normal Patient's awareness of abnormal movements (rate only patient's report): Aware, no distress, Dental Status Current problems with teeth and/or dentures?: No Does patient usually wear dentures?: No  CIWA:  CIWA-Ar Total: 10 COWS:  COWS Total Score: 7  Musculoskeletal: Strength & Muscle Tone: within normal limits Gait & Station: normal Patient leans: N/A  Psychiatric Specialty Exam: Physical Exam  ROS denies headache, no chest pain, no shortness of breath, no vomiting   Blood pressure (!) 134/94, pulse 99, temperature 98.1 F (36.7 C), temperature source Oral, resp. rate 18, height 5' 7"  (1.702 m), weight 74.8 kg (165 lb), SpO2 97 %.Body mass index is 25.84 kg/m.  General Appearance: Fairly Groomed- improved compared to admission  Eye Contact:  Good  Speech:  Normal Rate  Volume:  Normal  Mood:  less dysphoric, improving mood  Affect:  vaguely anxious, reactive  Thought Process:  Linear and Descriptions of Associations: Intact  Orientation:  Other:  fully alert and attentive   Thought Content:  denies hallucinations, no delusions, not internally preoccupied   Suicidal Thoughts:  No denies suicidal or self injurious ideations, denies homicidal or violent ideations  Homicidal Thoughts:  No  Memory:  recent and remote grossly intact   Judgement:  Fair  Insight:  Fair  Psychomotor Activity:  mild distal tremors,  but not in any acute distress nor agitated or restless at this time  Concentration:  Concentration: Good and Attention Span: Good  Recall:  Good  Fund of Knowledge:  Good  Language:  Good  Akathisia:  Negative  Handed:  Right  AIMS (if indicated):     Assets:  Desire for Improvement Resilience  ADL's:  Intact  Cognition:  WNL  Sleep:  Number of Hours: 5.5   Assessment- patient is improving gradually. Symptoms of alcohol WDL are abating, and today is less tremulous, " less shaky", although does continue to report feeling clammy and diaphoretic. Mood improving , denies SI.   Treatment Plan Summary: Daily contact with patient to assess and evaluate symptoms and progress in treatment, Medication management, Plan inpatient treatment  and medications as below I have discussed case with treatment team and have met with patient. Continue Ativan detox protocol to minimize symptoms of alcohol WDL . Start Campral 666 mgrs TID for alcohol dependence history/ cravings- rationale and side effects reviewed. Continue Celexa 10 mgrs QDAY for depression, anxiety Continue Neurontin 100 mgrs TID for anxiety, pain Continue Trazodone 50 mgrs QHS PRN for insomnia as needed  Treatment team working on disposition planning options- patient encouraged to consider residential setting/rehab after discharge, if possible  * have reviewed with hospitalist consultant- EKG not felt to be concerning for MI, and patient has no chest pain or associated symptoms. Due to persistently elevated BP, in spite of improvement of other dimensions of withdrawal , suggested to start Norvasc 5 mgrs QDAY .  Jenne Campus, MD 06/17/2016, 11:55 AM

## 2016-06-17 NOTE — Progress Notes (Signed)
Adult Psychoeducational Group Note  Date:  06/17/2016 Time:  0900 am  Group Topic/Focus:  Orientation:   The focus of this group is to educate the patient on the purpose and policies of crisis stabilization and provide a format to answer questions about their admission.  The group details unit policies and expectations of patients while admitted.  Participation Level:  Active  Participation Quality:  Appropriate  Affect:  Appropriate  Cognitive:  Appropriate  Insight: Appropriate  Engagement in Group:  Engaged  Modes of Intervention:  Discussion, Education and Orientation  Additional Comments:    Ayari Liwanag L 06/17/2016, 9:32 AM

## 2016-06-17 NOTE — Progress Notes (Signed)
Pt.s BP is elevated at 171/102.  His pulse is 93.  Temp. Is 98.3.  Manually it is 180/100.  Notified Dr. Parke Poisson of same.  EKG to be given.  [facility] is looking at patient, however, he is still detoxing.  Hospitalist consult ordered.

## 2016-06-17 NOTE — BHH Group Notes (Signed)
[facility] Mental Health Association Group Therapy 06/17/2016 1:15pm  Type of Therapy: Mental Health Association Presentation  Participation Level: Active  Participation Quality: Attentive  Affect: Appropriate  Cognitive: Oriented  Insight: Developing/Improving  Engagement in Therapy: Engaged  Modes of Intervention: Discussion, Education and Socialization  Summary of Progress/Problems: Mental Health Association ([place]) Speaker came to talk about his personal journey with substance abuse and addiction. The pt processed ways by which to relate to the speaker. [place] speaker provided handouts and educational information pertaining to groups and services offered by the [facility]. Pt was engaged in speaker's presentation and was receptive to resources provided.    Kara Mead. Marshell Levan, MSW, [facility] 06/17/2016 5:06 PM

## 2016-06-17 NOTE — Progress Notes (Signed)
   D: Pt informed the writer that he was last at [facility] in dec 2017. Writer observed pt in the dayroom with his peers however, didn't note the pt having any interaction.  Pt denied all except a/h, stating that he hears "weird voices".  Pt didn't freely engage in conversation with the writer, appeared to be apprehensive. Pt has no questions or concerns.    A:  Support and encouragement was offered. 15 min checks continued for safety.  R: Pt remains safe.

## 2016-06-17 NOTE — Progress Notes (Signed)
D: Patient states that he "feels better today."  He has been visible in the milieu and has been going to meals.  His withdrawal symptoms are not as severe today.  He reports tremors (are visible), cravings, runny nose, chilling and irritability.  Patient has bathed and body odor not present today.  He rates his depression and hopelessness as a 9; anxiety as an 8.  He reports passive thoughts of self harm, however, he contracts for safety on the unit.  Patient has intermittent auditory hallucinations.  He is quiet and stays mostly to himself.  He is attending groups today and seems more active then yesterday.  He is interested in a bed at [facility] possibly. A: Continue to monitor medication management and MD orders.  Safety checks completed every 15 minutes per protocol.  Offer support and encouragement as needed. R: Patient is receptive to staff; his behavior is appropriate.

## 2016-06-17 NOTE — Progress Notes (Signed)
[facility] TELEPHONE ENCOUNTER NOTE  Patient: Kwamaine Cuppett ZMO:[id number]   PCP: No PCP Per Patient DOB: Oct 29, 1965     DOS: 06/17/2016    Subjective: Received a call from psychiatry Dr. Parke Poisson. Call was regarding patient's elevated blood pressure. Patient does not have any acute complaint at present per Dr. Parke Poisson.  Objective:  Vitals:   06/17/16 1648 06/17/16 1649  BP: (!) 171/102 (!) 180/100  Pulse: 93   Resp: 18   Temp: 98.3 F (36.8 C)    EKG reviewed no evidence of acute ischemia.  Assessment and plan: Recommend to start the patient on Norvasc 5 mg daily. No contraindication based on my discussion as well as review of the chart.  Author: Berle Mull, MD [facility] Pager: [phone] 06/17/2016 4:57 PM   If 7PM-7AM, please contact night-coverage at [email or web address], password [facility]

## 2016-06-17 NOTE — Progress Notes (Signed)
Pt attend wrap up group. Pt said he like to be call Barry Moore. Pt said he do not have a goal. He wanted to sleep. He had a shower. He plans to leave and go to another in patient unit. Pt said he's working make this happen.

## 2016-06-18 DIAGNOSIS — F17210 Nicotine dependence, cigarettes, uncomplicated: Secondary | ICD-10-CM

## 2016-06-18 MED ORDER — LORAZEPAM 1 MG PO TABS
1.0000 mg | ORAL_TABLET | Freq: Every day | ORAL | Status: AC
  Administered 2016-06-21: 1 mg via ORAL
  Filled 2016-06-18: qty 1

## 2016-06-18 MED ORDER — LORAZEPAM 1 MG PO TABS
1.0000 mg | ORAL_TABLET | Freq: Two times a day (BID) | ORAL | Status: AC
  Administered 2016-06-20 (×2): 1 mg via ORAL
  Filled 2016-06-18 (×2): qty 1

## 2016-06-18 MED ORDER — LORAZEPAM 1 MG PO TABS
1.0000 mg | ORAL_TABLET | Freq: Four times a day (QID) | ORAL | Status: AC
  Administered 2016-06-18 (×3): 1 mg via ORAL
  Filled 2016-06-18 (×3): qty 1

## 2016-06-18 MED ORDER — LORAZEPAM 1 MG PO TABS
1.0000 mg | ORAL_TABLET | Freq: Three times a day (TID) | ORAL | Status: AC
  Administered 2016-06-19 (×3): 1 mg via ORAL
  Filled 2016-06-18 (×3): qty 1

## 2016-06-18 MED ORDER — TRAZODONE HCL 50 MG PO TABS
50.0000 mg | ORAL_TABLET | Freq: Every evening | ORAL | Status: DC | PRN
  Administered 2016-06-18 – 2016-06-20 (×4): 50 mg via ORAL
  Filled 2016-06-18 (×10): qty 1

## 2016-06-18 NOTE — Progress Notes (Signed)
D: Pt denies SI/HI/AVH. Pt is pleasant and cooperative. Pt goal for today was to attend groups. A: Pt was offered support and encouragement. Pt was given scheduled medications. Pt was encourage to attend groups. Q 15 minute checks were done for safety.  R:Pt attends groups and interacts well with peers and staff. Pt is taking medication. Pt has no complaints.Pt receptive to treatment and safety maintained on unit.

## 2016-06-18 NOTE — Progress Notes (Signed)
Pt did not attend group. 

## 2016-06-18 NOTE — Progress Notes (Signed)
Patient attended the evening group session and answered all discussion prompts from this Probation officer. Patient stated his goal for the day was to keep living. Patient rated his day an 8 out of 10 and his affect was appropriate.

## 2016-06-18 NOTE — Progress Notes (Signed)
D Ryman says he's " feeling   better" this evening ( as opposed to this morning). He is not as tremulous at dinner, he has been OOB .[name] Observed  using the phone in the hall  this evening and he has been able to attend lunch and dinner meals in the cafe', ambulating there without diff. A He contracts with Probation officer for safety. And he rates his depression, hopelessness and anxiety " 9/9/9", respectively. He remains a little trmulous at [address] and states " it never quite goes away..myalgias hands always shake...but they've calmed down a whold lot. R Safety in place. No withdrawal complications.

## 2016-06-18 NOTE — BHH Group Notes (Signed)
[place] LCSW Group Therapy 06/18/2016 1:15pm  Type of Therapy: Group Therapy- Feelings Around Relapse and Recovery  Participation Level: Pt invited. Did not attend.   Georga Kaufmann, MSW, [place] [phone] 06/18/2016 2:42 PM

## 2016-06-18 NOTE — Plan of Care (Signed)
Problem: Safety: Goal: Ability to remain free from injury will improve Ability to remain free from injury will improve AEB CIWA of 3, assessment and q5min safety checks.

## 2016-06-18 NOTE — Progress Notes (Signed)
[facility] MD Progress Note  06/18/2016 3:17 PM Barry Moore  MRN:  [id number]  Subjective:  Patient reports, "I'm not feeling well today. I'm not good. I got the shakes.".   Objective : I have discussed case with treatment team and have met with patient . Patient presents tremulous today. He was scheduled to be discharged when he presents with the alcohol withdrawal symptoms. Ativan detox protocols re-initiated, discharge cancelled. No disruptive or agitated behaviors on unit.  Principal Problem: Alcohol-induced mood disorder ([place]) Diagnosis:   Patient Active Problem List   Diagnosis Date Noted  . Alcohol-induced mood disorder ([place]) [F10.94] 06/15/2016  . Seizure ([place]) [R56.9] 05/26/2016  . Tobacco abuse [Z72.0] 05/26/2016  . Homeless [Z59.0] 05/26/2016  . Essential hypertension [I10] 05/26/2016  . MDD (major depressive disorder), recurrent severe, without psychosis ([place]) [F33.2] 03/06/2016  . Alcohol use disorder, severe, dependence ([place]) [F10.20] 02/14/2016  . Severe alcohol withdrawal without perceptual disturbances without complication ([place]) [O84.166] 02/14/2016  . Alcoholic hepatitis without ascites [K70.10] 02/14/2016  . Thrombocytopenia ([place]) [D69.6] 02/14/2016   Total Time spent with patient: 20 minutes   Past Medical History:  Past Medical History:  Diagnosis Date  . Alcoholic hepatitis   . Depression   . ETOH abuse   . Hypertension   . Seizures ([place])     Past Surgical History:  Procedure Laterality Date  . HERNIA REPAIR    . LEG SURGERY     Family History:  Family History  Problem Relation Age of Onset  . Diabetes Mother   . Alcoholism Mother   . Emphysema Father   . Lung cancer Father   . Alcoholism Father    Social History:  History  Alcohol Use  . Yes    Comment: 5th liquor and one Forty daily       History  Drug Use No    Social History   Social History  . Marital status: Divorced    Spouse name: N/A  . Number of children: N/A  . Years of  education: N/A   Social History Main Topics  . Smoking status: Current Every Day Smoker    Packs/day: 2.00    Types: Cigarettes  . Smokeless tobacco: Never Used  . Alcohol use Yes     Comment: 5th liquor and one Forty daily    . Drug use: No  . Sexual activity: Not Currently   Other Topics Concern  . None   Social History Narrative  . None   Additional Social History:    Pain Medications: denies Prescriptions: denies Over the Counter: denies History of alcohol / drug use?: Yes Longest period of sobriety (when/how long): "3 years when I was in prison" reports it was from 2013-2016 Negative Consequences of Use: Personal relationships, Museum/gallery curator, Work / School Withdrawal Symptoms: Agitation, Sweats, Seizures, Tremors (Hx of seizures) Name of Substance 1: ETOH 1 - Age of First Use: teens 1 - Amount (size/oz): 1/5 of liquor and 1/5 of liquor and (3) 40 oz. beers 1 - Frequency: daily 1 - Duration: ongoing 1 - Last Use / Amount: yesterday  Sleep: improving   Appetite:  fair , improving   Current Medications: Current Facility-Administered Medications  Medication Dose Route Frequency Provider Last Rate Last Dose  . acamprosate (CAMPRAL) tablet 666 mg  666 mg Oral TID WC Jenne Campus, MD   666 mg at 06/18/16 1312  . alum & mag hydroxide-simeth (MAALOX/MYLANTA) 200-200-20 MG/5ML suspension 30 mL  30 mL Oral Q4H PRN Maurine Minister  Simon, PA-C      . amLODipine (NORVASC) tablet 5 mg  5 mg Oral Daily Jenne Campus, MD   5 mg at 06/18/16 0750  . citalopram (CELEXA) tablet 10 mg  10 mg Oral Daily Jenne Campus, MD   10 mg at 06/18/16 0750  . feeding supplement (ENSURE ENLIVE) (ENSURE ENLIVE) liquid 237 mL  237 mL Oral BID BM Maurine Minister Simon, PA-C   237 mL at 06/17/16 1507  . gabapentin (NEURONTIN) capsule 100 mg  100 mg Oral TID Jenne Campus, MD   100 mg at 06/18/16 1311  . loperamide (IMODIUM) capsule 2-4 mg  2-4 mg Oral PRN Laverle Hobby, PA-C      . LORazepam (ATIVAN)  tablet 1 mg  1 mg Oral Q6H PRN Laverle Hobby, PA-C   1 mg at 06/16/16 1547  . LORazepam (ATIVAN) tablet 1 mg  1 mg Oral QID Jenne Campus, MD   1 mg at 06/18/16 1312   Followed by  . [START ON 06/19/2016] LORazepam (ATIVAN) tablet 1 mg  1 mg Oral TID Jenne Campus, MD       Followed by  . [START ON 06/20/2016] LORazepam (ATIVAN) tablet 1 mg  1 mg Oral BID Jenne Campus, MD       Followed by  . [START ON 06/21/2016] LORazepam (ATIVAN) tablet 1 mg  1 mg Oral Daily Fernando A Cobos, MD      . magnesium hydroxide (MILK OF MAGNESIA) suspension 30 mL  30 mL Oral Daily PRN Laverle Hobby, PA-C      . multivitamin with minerals tablet 1 tablet  1 tablet Oral Daily Laverle Hobby, PA-C   1 tablet at 06/18/16 0750  . nicotine polacrilex (NICORETTE) gum 2 mg  2 mg Oral PRN Laverle Hobby, PA-C      . ondansetron (ZOFRAN-ODT) disintegrating tablet 4 mg  4 mg Oral Q6H PRN Laverle Hobby, PA-C      . thiamine (B-1) injection 100 mg  100 mg Intramuscular Once [employer], PA-C      . thiamine (VITAMIN B-1) tablet 100 mg  100 mg Oral Daily Laverle Hobby, PA-C   100 mg at 06/18/16 0750  . traZODone (DESYREL) tablet 50 mg  50 mg Oral QHS,MR X 1 Rozetta Nunnery, NP   50 mg at 06/18/16 0021   Lab Results: No results found for this or any previous visit (from the past 48 hour(s)).  Blood Alcohol level:  Lab Results  Component Value Date   ETH 114 (H) 06/15/2016   ETH 436 (HH) 85/46/2703   Metabolic Disorder Labs: Lab Results  Component Value Date   HGBA1C 5.2 03/07/2016   MPG 103 03/07/2016   Lab Results  Component Value Date   PROLACTIN 23.2 (H) 03/07/2016   Lab Results  Component Value Date   CHOL 215 (H) 03/07/2016   TRIG 82 03/07/2016   HDL 105 03/07/2016   CHOLHDL 2.0 03/07/2016   VLDL 16 03/07/2016   LDLCALC 94 03/07/2016    Physical Findings: AIMS: Facial and Oral Movements Muscles of Facial Expression: None, normal Lips and Perioral Area: None, normal Jaw: None,  normal Tongue: None, normal,Extremity Movements Upper (arms, wrists, hands, fingers): Mild Lower (legs, knees, ankles, toes): None, normal, Trunk Movements Neck, shoulders, hips: None, normal, Overall Severity Severity of abnormal movements (highest score from questions above): Minimal Incapacitation due to abnormal movements: None, normal Patient's awareness of abnormal  movements (rate only patient's report): Aware, no distress, Dental Status Current problems with teeth and/or dentures?: No Does patient usually wear dentures?: No  CIWA:  CIWA-Ar Total: 4 COWS:  COWS Total Score: 7  Musculoskeletal: Strength & Muscle Tone: within normal limits Gait & Station: normal Patient leans: N/A  Psychiatric Specialty Exam: Physical Exam  ROS denies headache, no chest pain, no shortness of breath, no vomiting   Blood pressure (!) 140/94, pulse (!) 102, temperature 98.4 F (36.9 C), temperature source Oral, resp. rate 15, height _0  (1.702 m), weight 74.8 kg (165 lb), SpO2 97 %.Body mass index is 25.84 kg/m.  General Appearance: Disheveled- improved compared to admission  Eye Contact:  Fair  Speech:  Normal Rate and Slow  Volume:  Decreased  Mood:  Anxious  Affect:  vaguely anxious, reactive  Thought Process:  Linear and Descriptions of Associations: Intact  Orientation:  Other:  fully alert and attentive   Thought Content:  denies hallucinations, no delusions, not internally preoccupied   Suicidal Thoughts:  No denies suicidal or self injurious ideations, denies homicidal or violent ideations  Homicidal Thoughts:  No  Memory:  recent and remote grossly intact   Judgement:  Fair  Insight:  Fair  Psychomotor Activity:  mild distal tremors, but not in any acute distress nor agitated or restless at this time  Concentration:  Concentration: Good and Attention Span: Good  Recall:  Good  Fund of Knowledge:  Good  Language:  Good  Akathisia:  Negative  Handed:  Right  AIMS (if indicated):      Assets:  Desire for Improvement Resilience  ADL's:  Intact  Cognition:  WNL  Sleep:  Number of Hours: 6   Assessment- Patient is improving gradually. Symptoms of alcohol WDL are still lingering & today presents tremulous. more shaky", Mood improving , denies SI.   Treatment Plan Summary: Daily contact with patient to assess and evaluate symptoms and progress in treatment, Medication management, Plan inpatient treatment  and medications as below  Will continue the Ativan detox protocol to minimize symptoms of alcohol WDL .  Will continue Campral 666 mgrs TID for alcohol dependence history/ cravings- rationale and side effects reviewed.  Will continue Celexa 10 mgrs QDAY for depression, anxiety  Will continue Neurontin 100 mgrs TID for anxiety, pain  Will continue Trazodone 50 mgrs QHS PRN for insomnia as needed   Treatment team working on disposition planning options- patient encouraged to consider residential setting/rehab after discharge, if possible   Attending Md had reviewed with hospitalist consultant- EKG not felt to be concerning for MI, and patient has no chest pain or associated symptoms. Due to persistently elevated BP, in spite of improvement of other dimensions of withdrawal, will continue Norvasc 5 mgrs QDAY .  Encarnacion Slates, NP, PMHNP, FNP-BC. 06/18/2016, 3:17 PMPatient ID: Barry Moore, male   DOB: 11-01-1965, 51 y.o.   MRN: [id number]

## 2016-06-18 NOTE — Progress Notes (Signed)
Per MD, pt will NOT be discharging to [facility] today. Pt is still currently withdrawing. CSW notified Shayla from [facility] [phone]). NP and RN also aware.  Georga Kaufmann, MSW, [place]

## 2016-06-18 NOTE — B[facility] Group Notes (Signed)
[facility] LCSW Aftercare Discharge Planning Group Note   06/18/2016 9:53 AM  Participation Quality:  Pt invited. Did not attend.   Georga Kaufmann, MSW, Latanya Presser

## 2016-06-18 NOTE — Progress Notes (Signed)
Recreation Therapy Notes  Date: 06/18/16 Time: 0930 Location: 300 Hall Dayroom  Group Topic: Stress Management  Goal Area(s) Addresses:  Patient will verbalize importance of using healthy stress management.  Patient will identify positive emotions associated with healthy stress management.   Intervention: Stress Management  Activity :  Self Forgiveness.  LRT introduced the stress management technique of meditation.  LRT played a meditation focused on self forgiveness allowing patients to participate in the technique.  Patients were to follow along as the meditation played to engage in the technique.  Education:  Stress Management, Discharge Planning.   Education Outcome: Acknowledges edcuation/In group clarification offered/Needs additional education  Clinical Observations/Feedback: Pt did not attend group.   Victorino Sparrow, LRT/CTRS         Victorino Sparrow A 06/18/2016 11:56 AM

## 2016-06-19 DIAGNOSIS — Z720 Tobacco use: Secondary | ICD-10-CM

## 2016-06-19 NOTE — BHH Group Notes (Signed)
Adult Group Therapy Note  Date:  06/19/2016  Time: 10:00AM-11:00AM  Group Topic/Focus: Today's group focused on the topic of healthy and unhealthy coping skills.  Commonalities were found in group members, including drinking, using drugs, binge eating, isolation, anger outbursts, isolation and suicide attempts.  The reasons unhealthy coping skills are used were listed by group members, with a focus on them being fast, easy, and initially working.  The down sides were also discussed at length.  We ended the discussion by brainstorming ways to learn specific new healthy coping skills that patients said they would like to try.  Participation Level:  Active  Participation Quality:  Attentive  Affect:  Blunted  Cognitive:  Appropriate  Insight: Improving  Engagement in Group:  Improving  Modes of Intervention:  Activity, Discussion and Support  Additional Comments:  The patient expressed initially that he could not identify any healthy coping skills he uses, but eventually said he likes mountain bike riding and get off in the woods to take in nature.  He said that he likes to think that drinking is helpful for him because he does it so much and it did initially help; however, he acknowledged that he needs to go to the liquor store first thing in the morning, and is harming his body.  He spoke little except when called on directly.  Barry Moore 06/19/2016 , 1:06 PM

## 2016-06-19 NOTE — Progress Notes (Signed)
D: Pt visible in milieu at intervals during shift. Pleasant and animated on initial approach. Denies SI, HI, AVH and pain, bilateral hand tremors noted at time of medication administration. Pt A & O X3."I still have some shakes and sweats but I'm doing ok" when asked about withdrawal symptoms. Rates his depression 8/10, hopelessness 9/10 and anxiety 9/10 on self inventory sheet. States appetite is good with fair sleep, normal energy and poor concentration level.  A: Medications administered as per physician's order. Support and availability provided to pt. Encouraged pt to voice concerns, attend groups and comply with medications as ordered. Q 15 minutes safety checks continues without outburst or self harm gestures.  R: Pt receptive to care. Compliant with medications, denies adverse drug reactions. Attended scheduled groups. Tolerated all PO intake well. Remains safe on and off unit. Denies concerns at this time.

## 2016-06-19 NOTE — Progress Notes (Signed)
[facility] MD Progress Note  06/19/2016 3:08 PM Terrie Grajales  MRN:  [id number]  Subjective:  Patient reports " I am feeling better today than yesterday." Reports they have been working on getting my blood pressure down   Objective :[name] is awake, alert and oriented *3.  Denies suicidal or homicidal ideation. Denies auditory or visual hallucination and does not appear to be responding to internal stimuli.  Patient interacts well with staff and others. Patient reports he is medication compliant without mediation side effects. Patient reports he feels ready to discharge on Monday. patient reports he is resting well throughout the night. Support, encouragement and reassurance was provided.   Principal Problem: Alcohol-induced mood disorder ([place]) Diagnosis:   Patient Active Problem List   Diagnosis Date Noted  . Alcohol-induced mood disorder ([place]) [F10.94] 06/15/2016  . Seizure ([place]) [R56.9] 05/26/2016  . Tobacco abuse [Z72.0] 05/26/2016  . Homeless [Z59.0] 05/26/2016  . Essential hypertension [I10] 05/26/2016  . MDD (major depressive disorder), recurrent severe, without psychosis ([place]) [F33.2] 03/06/2016  . Alcohol use disorder, severe, dependence ([place]) [F10.20] 02/14/2016  . Severe alcohol withdrawal without perceptual disturbances without complication ([place]) [W09.811] 02/14/2016  . Alcoholic hepatitis without ascites [K70.10] 02/14/2016  . Thrombocytopenia ([place]) [D69.6] 02/14/2016   Total Time spent with patient: 20 minutes   Past Medical History:  Past Medical History:  Diagnosis Date  . Alcoholic hepatitis   . Depression   . ETOH abuse   . Hypertension   . Seizures ([place])     Past Surgical History:  Procedure Laterality Date  . HERNIA REPAIR    . LEG SURGERY     Family History:  Family History  Problem Relation Age of Onset  . Diabetes Mother   . Alcoholism Mother   . Emphysema Father   . Lung cancer Father   . Alcoholism Father    Social History:  History   Alcohol Use  . Yes    Comment: 5th liquor and one Forty daily       History  Drug Use No    Social History   Social History  . Marital status: Divorced    Spouse name: N/A  . Number of children: N/A  . Years of education: N/A   Social History Main Topics  . Smoking status: Current Every Day Smoker    Packs/day: 2.00    Types: Cigarettes  . Smokeless tobacco: Never Used  . Alcohol use Yes     Comment: 5th liquor and one Forty daily    . Drug use: No  . Sexual activity: Not Currently   Other Topics Concern  . None   Social History Narrative  . None   Additional Social History:    Pain Medications: denies Prescriptions: denies Over the Counter: denies History of alcohol / drug use?: Yes Longest period of sobriety (when/how long): "3 years when I was in prison" reports it was from 2013-2016 Negative Consequences of Use: Personal relationships, Museum/gallery curator, Work / School Withdrawal Symptoms: Agitation, Sweats, Seizures, Tremors (Hx of seizures) Name of Substance 1: ETOH 1 - Age of First Use: teens 1 - Amount (size/oz): 1/5 of liquor and 1/5 of liquor and (3) 40 oz. beers 1 - Frequency: daily 1 - Duration: ongoing 1 - Last Use / Amount: yesterday  Sleep: improving   Appetite:  fair , improving   Current Medications: Current Facility-Administered Medications  Medication Dose Route Frequency Provider Last Rate Last Dose  . acamprosate (CAMPRAL) tablet 666 mg  666 mg  Oral TID WC Myer Peer Cobos, MD   666 mg at 06/19/16 1146  . alum & mag hydroxide-simeth (MAALOX/MYLANTA) 200-200-20 MG/5ML suspension 30 mL  30 mL Oral Q4H PRN Laverle Hobby, PA-C      . amLODipine (NORVASC) tablet 5 mg  5 mg Oral Daily Jenne Campus, MD   5 mg at 06/19/16 0834  . citalopram (CELEXA) tablet 10 mg  10 mg Oral Daily Jenne Campus, MD   10 mg at 06/19/16 0834  . feeding supplement (ENSURE ENLIVE) (ENSURE ENLIVE) liquid 237 mL  237 mL Oral BID BM Maurine Minister Simon, PA-C   237 mL at  06/19/16 1020  . gabapentin (NEURONTIN) capsule 100 mg  100 mg Oral TID Jenne Campus, MD   100 mg at 06/19/16 1146  . LORazepam (ATIVAN) tablet 1 mg  1 mg Oral TID Jenne Campus, MD   1 mg at 06/19/16 1146   Followed by  . [START ON 06/20/2016] LORazepam (ATIVAN) tablet 1 mg  1 mg Oral BID Jenne Campus, MD       Followed by  . [START ON 06/21/2016] LORazepam (ATIVAN) tablet 1 mg  1 mg Oral Daily Fernando A Cobos, MD      . magnesium hydroxide (MILK OF MAGNESIA) suspension 30 mL  30 mL Oral Daily PRN Laverle Hobby, PA-C      . multivitamin with minerals tablet 1 tablet  1 tablet Oral Daily Laverle Hobby, PA-C   1 tablet at 06/19/16 [phone]  . nicotine polacrilex (NICORETTE) gum 2 mg  2 mg Oral PRN Laverle Hobby, PA-C      . thiamine (B-1) injection 100 mg  100 mg Intramuscular Once [employer], PA-C      . thiamine (VITAMIN B-1) tablet 100 mg  100 mg Oral Daily Laverle Hobby, PA-C   100 mg at 06/19/16 1779  . traZODone (DESYREL) tablet 50 mg  50 mg Oral QHS,MR X 1 Rozetta Nunnery, NP   50 mg at 06/18/16 2146   Lab Results: No results found for this or any previous visit (from the past 48 hour(s)).  Blood Alcohol level:  Lab Results  Component Value Date   ETH 114 (H) 06/15/2016   ETH 436 (HH) 39/05/90   Metabolic Disorder Labs: Lab Results  Component Value Date   HGBA1C 5.2 03/07/2016   MPG 103 03/07/2016   Lab Results  Component Value Date   PROLACTIN 23.2 (H) 03/07/2016   Lab Results  Component Value Date   CHOL 215 (H) 03/07/2016   TRIG 82 03/07/2016   HDL 105 03/07/2016   CHOLHDL 2.0 03/07/2016   VLDL 16 03/07/2016   LDLCALC 94 03/07/2016    Physical Findings: AIMS: Facial and Oral Movements Muscles of Facial Expression: None, normal Lips and Perioral Area: None, normal Jaw: None, normal Tongue: None, normal,Extremity Movements Upper (arms, wrists, hands, fingers): Mild (tremors) Lower (legs, knees, ankles, toes): None, normal, Trunk  Movements Neck, shoulders, hips: None, normal, Overall Severity Severity of abnormal movements (highest score from questions above): Minimal Incapacitation due to abnormal movements: None, normal Patient's awareness of abnormal movements (rate only patient's report): Aware, no distress, Dental Status Current problems with teeth and/or dentures?: No Does patient usually wear dentures?: No  CIWA:  CIWA-Ar Total: 1 COWS:  COWS Total Score: 7  Musculoskeletal: Strength & Muscle Tone: within normal limits Gait & Station: normal Patient leans: N/A  Psychiatric Specialty Exam: Physical Exam  Nursing note and vitals reviewed. Constitutional: He is oriented to person, place, and time. He appears well-developed.  Neurological: He is alert and oriented to person, place, and time.  Skin: Skin is warm and dry.  Psychiatric: He has a normal mood and affect. His behavior is normal.    Review of Systems  Psychiatric/Behavioral: Positive for depression. The patient is nervous/anxious.    denies headache, no chest pain, no shortness of breath, no vomiting   Blood pressure 125/75, pulse 76, temperature 98 F (36.7 C), temperature source Oral, resp. rate 18, height 5\' 7"  (1.702 m), weight 74.8 kg (165 lb), SpO2 97 %.Body mass index is 25.84 kg/m.  General Appearance: Casual  Eye Contact:  Fair  Speech:  Normal Rate and Slow  Volume:  Decreased  Mood:  Anxious  Affect:  Depressed and Flat  Thought Process:  Coherent and Linear  Orientation:  Full (Time, Place, and Person)  Thought Content:  denies hallucinations, no delusions, not internally preoccupied   Suicidal Thoughts:  No   Homicidal Thoughts:  No  Memory:  recent and remote grossly intact   Judgement:  Fair  Insight:  Fair  Psychomotor Activity:  Normal  Concentration:  Concentration: Good and Attention Span: Good  Recall:  Good  Fund of Knowledge:  Good  Language:  Good  Akathisia:  Negative  Handed:  Right  AIMS (if indicated):      Assets:  Desire for Improvement Resilience  ADL's:  Intact  Cognition:  WNL  Sleep:  Number of Hours: 6.75     I agree with current treatment plan on 06-19-2016, Patient seen face-to-face for psychiatric evaluation follow-up, chart reviewed. Reviewed the information documented and agree with the treatment plan.  Treatment Plan Summary: Daily contact with patient to assess and evaluate symptoms and progress in treatment, Medication management, Plan inpatient treatment  and medications as below  -continue with current treatment plan as listed below on 06-19-2016 except where noted.  Will continue the Ativan detox protocol to minimize symptoms of alcohol WDL .  Will continue Campral 666 mgrs TID for alcohol dependence history/ cravings- rationale and side effects reviewed.  Will continue Celexa 10 mgrs QDAY for depression, anxiety  Will continue Neurontin 100 mgrs TID for anxiety, pain  Will continue Trazodone 50 mgrs QHS PRN for insomnia as needed   Treatment team working on disposition planning options- patient encouraged to consider residential setting/rehab after discharge, if possible   Noted : 3/28/2018Attending Md had reviewed with hospitalist consultant- EKG not felt to be concerning for MI, and patient has no chest pain or associated symptoms. Due to persistently elevated BP, in spite of improvement of other dimensions of withdrawal, will continue Norvasc 5 mgrs QDAY .  Derrill [place], NP 06/19/2016, 3:08 PM

## 2016-06-19 NOTE — Progress Notes (Signed)
D    Pt is pleasant on approach and cooperative    He reports minimal signs and symptoms of withdrawal and said he is feeling much better    Pt requested his sleep medication A     Verbal support given   Medications administered and effectiveness monitored  Q 15 min checks R    Pt safe and receptive to verbal support

## 2016-06-20 NOTE — Progress Notes (Signed)
D    Pt is pleasant on approach and cooperative    He reports minimal signs and symptoms of withdrawal and said he is feeling much better    Pt requested his sleep medication   Pt reports feeling much better than when he first came in A     Verbal support given   Medications administered and effectiveness monitored  Q 15 min checks R    Pt safe and receptive to verbal support

## 2016-06-20 NOTE — Progress Notes (Signed)
Adult Psychoeducational Group Note  Date:  06/20/2016 Time:  9:05 PM  Group Topic/Focus:  Wrap-Up Group:   The focus of this group is to help patients review their daily goal of treatment and discuss progress on daily workbooks.  Participation Level:  Active  Participation Quality:  Appropriate and Attentive  Affect:  Appropriate  Cognitive:  Appropriate  Insight: Appropriate  Engagement in Group:  Engaged  Modes of Intervention:  Discussion  Additional Comments:  Pt stated he didn't really have a goal for today. Pt stated he enjoyed being able to go outside and get some fresh air.  Clint Bolder 06/20/2016, 9:05 PM

## 2016-06-20 NOTE — Progress Notes (Signed)
[facility] MD Progress Note  06/20/2016 1:14 PM Barry Moore  MRN:  [id number]  Subjective:  Patient reports " I am doing okay"    Objective :Barry Moore seen resting in bed room. Awake and alert. Reports he has been attending group sessions. States he get's alone with peers and staff. Patient denies suicidal or homicidal ideation or thoughts of self harm. Denies auditory or visual hallucination and does not appear to be responding to internal stimuli.  Patient interacts well with staff and others. Patient reports he is medication compliant without mediation side effects. patient continues to report he is resting well throughout the night. Support, encouragement and reassurance was provided.   Principal Problem: Alcohol-induced mood disorder ([place]) Diagnosis:   Patient Active Problem List   Diagnosis Date Noted  . Alcohol-induced mood disorder ([place]) [F10.94] 06/15/2016  . Seizure ([place]) [R56.9] 05/26/2016  . Tobacco abuse [Z72.0] 05/26/2016  . Homeless [Z59.0] 05/26/2016  . Essential hypertension [I10] 05/26/2016  . MDD (major depressive disorder), recurrent severe, without psychosis ([place]) [F33.2] 03/06/2016  . Alcohol use disorder, severe, dependence ([place]) [F10.20] 02/14/2016  . Severe alcohol withdrawal without perceptual disturbances without complication ([place]) [B34.193] 02/14/2016  . Alcoholic hepatitis without ascites [K70.10] 02/14/2016  . Thrombocytopenia ([place]) [D69.6] 02/14/2016   Total Time spent with patient: 20 minutes   Past Medical History:  Past Medical History:  Diagnosis Date  . Alcoholic hepatitis   . Depression   . ETOH abuse   . Hypertension   . Seizures ([place])     Past Surgical History:  Procedure Laterality Date  . HERNIA REPAIR    . LEG SURGERY     Family History:  Family History  Problem Relation Age of Onset  . Diabetes Mother   . Alcoholism Mother   . Emphysema Father   . Lung cancer Father   . Alcoholism Father    Social History:  History   Alcohol Use  . Yes    Comment: 5th liquor and one Forty daily       History  Drug Use No    Social History   Social History  . Marital status: Divorced    Spouse name: N/A  . Number of children: N/A  . Years of education: N/A   Social History Main Topics  . Smoking status: Current Every Day Smoker    Packs/day: 2.00    Types: Cigarettes  . Smokeless tobacco: Never Used  . Alcohol use Yes     Comment: 5th liquor and one Forty daily    . Drug use: No  . Sexual activity: Not Currently   Other Topics Concern  . None   Social History Narrative  . None   Additional Social History:    Pain Medications: denies Prescriptions: denies Over the Counter: denies History of alcohol / drug use?: Yes Longest period of sobriety (when/how long): "3 years when I was in prison" reports it was from 2013-2016 Negative Consequences of Use: Personal relationships, Museum/gallery curator, Work / School Withdrawal Symptoms: Agitation, Sweats, Seizures, Tremors (Hx of seizures) Name of Substance 1: ETOH 1 - Age of First Use: teens 1 - Amount (size/oz): 1/5 of liquor and 1/5 of liquor and (3) 40 oz. beers 1 - Frequency: daily 1 - Duration: ongoing 1 - Last Use / Amount: yesterday  Sleep: improving   Appetite:  fair , improving   Current Medications: Current Facility-Administered Medications  Medication Dose Route Frequency Provider Last Rate Last Dose  . acamprosate (CAMPRAL) tablet 666 mg  666 mg Oral TID WC Jenne Campus, MD   666 mg at 06/20/16 1206  . alum & mag hydroxide-simeth (MAALOX/MYLANTA) 200-200-20 MG/5ML suspension 30 mL  30 mL Oral Q4H PRN Laverle Hobby, PA-C      . amLODipine (NORVASC) tablet 5 mg  5 mg Oral Daily Jenne Campus, MD   5 mg at 06/20/16 0749  . citalopram (CELEXA) tablet 10 mg  10 mg Oral Daily Jenne Campus, MD   10 mg at 06/20/16 0749  . feeding supplement (ENSURE ENLIVE) (ENSURE ENLIVE) liquid 237 mL  237 mL Oral BID BM Laverle Hobby, PA-C   237 mL at  06/20/16 1206  . gabapentin (NEURONTIN) capsule 100 mg  100 mg Oral TID Jenne Campus, MD   100 mg at 06/20/16 1206  . LORazepam (ATIVAN) tablet 1 mg  1 mg Oral BID Jenne Campus, MD   1 mg at 06/20/16 0759   Followed by  . [START ON 06/21/2016] LORazepam (ATIVAN) tablet 1 mg  1 mg Oral Daily Fernando A Cobos, MD      . magnesium hydroxide (MILK OF MAGNESIA) suspension 30 mL  30 mL Oral Daily PRN Laverle Hobby, PA-C      . multivitamin with minerals tablet 1 tablet  1 tablet Oral Daily Laverle Hobby, PA-C   1 tablet at 06/20/16 0750  . nicotine polacrilex (NICORETTE) gum 2 mg  2 mg Oral PRN Laverle Hobby, PA-C      . thiamine (B-1) injection 100 mg  100 mg Intramuscular Once [employer], PA-C      . thiamine (VITAMIN B-1) tablet 100 mg  100 mg Oral Daily Laverle Hobby, PA-C   100 mg at 06/20/16 0749  . traZODone (DESYREL) tablet 50 mg  50 mg Oral QHS,MR X 1 Rozetta Nunnery, NP   50 mg at 06/19/16 2222   Lab Results: No results found for this or any previous visit (from the past 51 hour(s)).  Blood Alcohol level:  Lab Results  Component Value Date   ETH 114 (H) 06/15/2016   ETH 436 (HH) 14/48/1856   Metabolic Disorder Labs: Lab Results  Component Value Date   HGBA1C 5.2 03/07/2016   MPG 103 03/07/2016   Lab Results  Component Value Date   PROLACTIN 23.2 (H) 03/07/2016   Lab Results  Component Value Date   CHOL 215 (H) 03/07/2016   TRIG 82 03/07/2016   HDL 105 03/07/2016   CHOLHDL 2.0 03/07/2016   VLDL 16 03/07/2016   LDLCALC 94 03/07/2016    Physical Findings: AIMS: Facial and Oral Movements Muscles of Facial Expression: None, normal Lips and Perioral Area: None, normal Jaw: None, normal Tongue: None, normal,Extremity Movements Upper (arms, wrists, hands, fingers): Mild (tremors) Lower (legs, knees, ankles, toes): None, normal, Trunk Movements Neck, shoulders, hips: None, normal, Overall Severity Severity of abnormal movements (highest score from  questions above): Minimal Incapacitation due to abnormal movements: None, normal Patient's awareness of abnormal movements (rate only patient's report): Aware, no distress, Dental Status Current problems with teeth and/or dentures?: No Does patient usually wear dentures?: No  CIWA:  CIWA-Ar Total: 1 COWS:  COWS Total Score: 7  Musculoskeletal: Strength & Muscle Tone: within normal limits Gait & Station: normal Patient leans: N/A  Psychiatric Specialty Exam: Physical Exam  Nursing note and vitals reviewed. Constitutional: He is oriented to person, place, and time. He appears well-developed.  Cardiovascular: Normal rate.   Neurological: He  is alert and oriented to person, place, and time.  Skin: Skin is warm and dry.  Psychiatric: He has a normal mood and affect. His behavior is normal.    Review of Systems  Psychiatric/Behavioral: Positive for depression. The patient is nervous/anxious.    denies headache, no chest pain, no shortness of breath, no vomiting   Blood pressure 139/80, pulse 86, temperature 98.3 F (36.8 C), temperature source Oral, resp. rate 18, height 5\' 7"  (1.702 m), weight 74.8 kg (165 lb), SpO2 100 %.Body mass index is 25.84 kg/m.  General Appearance: Casual and Guarded  Eye Contact:  Fair  Speech:  Normal Rate  Volume:  Decreased  Mood:  Depressed  Affect:  Congruent  Thought Process:  Coherent and Linear  Orientation:  Full (Time, Place, and Person)  Thought Content:  Hallucinations: None and Rumination  Suicidal Thoughts:  No   Homicidal Thoughts:  No  Memory:  Immediate;   Fair Recent;   Fair Remote;   Fair  Judgement:  Fair  Insight:  Fair  Psychomotor Activity:  Normal  Concentration:  Concentration: Good and Attention Span: Good  Recall:  Good  Fund of Knowledge:  Good  Language:  Good  Akathisia:  Negative  Handed:  Right  AIMS (if indicated):     Assets:  Desire for Improvement Resilience  ADL's:  Intact  Cognition:  WNL  Sleep:   Number of Hours: 6.25     I agree with current treatment plan on 06/20/2016, Patient seen face-to-face for psychiatric evaluation follow-up, chart reviewed. Reviewed the information documented and agree with the treatment plan.  Treatment Plan Summary: Daily contact with patient to assess and evaluate symptoms and progress in treatment, Medication management, Plan inpatient treatment  and medications as below  -continue with current treatment plan as listed below on 06/20/2016 except where noted.  Will continue the Ativan detox protocol to minimize symptoms of alcohol WDL .  Will continue Campral 666 mgrs TID for alcohol dependence history/ cravings- rationale and side effects reviewed.  Will continue Celexa 10 mgrs QDAY for depression, anxiety  Will continue Neurontin 100 mgrs TID for anxiety, pain  Will continue Trazodone 50 mgrs QHS PRN for insomnia as needed   Treatment team working on disposition planning options- patient encouraged to consider residential setting/rehab after discharge, if possible   Noted : 3/28/2018Attending Md had reviewed with hospitalist consultant- EKG not felt to be concerning for MI, and patient has no chest pain or associated symptoms. Due to persistently elevated BP, in spite of improvement of other dimensions of withdrawal, will continue Norvasc 5 mgrs QDAY .  Derrill Center, NP 06/20/2016, 1:14 PM

## 2016-06-20 NOTE — BHH Suicide Risk Assessment (Signed)
[place] INPATIENT:  Family/Significant Other Suicide Prevention Education  Suicide Prevention Education:  Patient Refusal for Family/Significant Other Suicide Prevention Education: The patient Barry Moore has refused to provide written consent for family/significant other to be provided Family/Significant Other Suicide Prevention Education during admission and/or prior to discharge.  Physician notified.  Pt states he has nobody to contact.  Berlin Hun Grossman-Orr 06/20/2016, 6:09 PM

## 2016-06-20 NOTE — Progress Notes (Addendum)
D: Barry Moore noted he does not remember what really brought him. "I had a lot of drinks that day, lost my backpack and I just felt like killing myself, suicidal." Denies SI/AH/VH. He resides in a tent near [place] on [address]. He expresses concern of losing his tent and other personal items. He has no family support. He is undecided about inpt services with [facility]. Tremors are noted during the day.    A: Barry Moore encouraged to speak with social worker about placement, outpt, and inpt services. He has at hand outpt resources that he has interest in. Encourage him to attend groups more.   R: Educated pt on medications and resources. Pt has no complaints. He will attempt to go to more groups and to communicate what his needs are.

## 2016-06-20 NOTE — BHH Group Notes (Signed)
Adult Therapy Group Note  Date: 06/20/2016  Time:  10:00-11:00AM  Group Topic/Focus: Healthy Support Systems   Building Self Esteem:   The focus of this group was to assist patients in identifying their current healthy supports and unhealthy supports, then discussing how to add additional healthy supports and reduce existing unhealthy ones.    Participation Level:  Active  Participation Quality:  Attentive and Sharing  Affect:  Appropriate and Depressed  Cognitive:  Appropriate  Insight: Good  Engagement in Group:  Engaged  Modes of Intervention:  Discussion and Support  Additional Comments:  The patient expressed that a current health support is nonexistent while an unhealthy support is himself.  He spoke quite a bit throughout this group, talking about his anger at sister that he still holds after 15 years.  He was able to eventually decide that perhaps she could be of some support to him, if he would let her.  He stated that he does not want to return [place] 2 [place], asked for an [employer] for [place].Maretta Los, LCSW 06/20/2016  1:08 PM

## 2016-06-21 MED ORDER — CITALOPRAM HYDROBROMIDE 20 MG PO TABS
20.0000 mg | ORAL_TABLET | Freq: Every day | ORAL | Status: DC
  Administered 2016-06-22 – 2016-06-23 (×2): 20 mg via ORAL
  Filled 2016-06-21: qty 7
  Filled 2016-06-21 (×4): qty 1

## 2016-06-21 MED ORDER — TRAZODONE HCL 100 MG PO TABS
100.0000 mg | ORAL_TABLET | Freq: Every evening | ORAL | Status: DC | PRN
  Administered 2016-06-21 – 2016-06-22 (×3): 100 mg via ORAL
  Filled 2016-06-21 (×3): qty 1
  Filled 2016-06-21: qty 28
  Filled 2016-06-21 (×2): qty 1
  Filled 2016-06-21: qty 28
  Filled 2016-06-21 (×3): qty 1
  Filled 2016-06-21: qty 28
  Filled 2016-06-21: qty 1

## 2016-06-21 MED ORDER — GABAPENTIN 100 MG PO CAPS
200.0000 mg | ORAL_CAPSULE | Freq: Three times a day (TID) | ORAL | Status: DC
  Administered 2016-06-21 – 2016-06-23 (×6): 200 mg via ORAL
  Filled 2016-06-21: qty 2
  Filled 2016-06-21 (×2): qty 84
  Filled 2016-06-21 (×2): qty 2
  Filled 2016-06-21: qty 84
  Filled 2016-06-21 (×8): qty 2

## 2016-06-21 NOTE — BHH Group Notes (Signed)
[place] LCSW Group Therapy  06/21/2016 1:15pm  Type of Therapy: Group Therapy   Topic: Overcoming Obstacles  Participation Level: Active  Participation Quality: Appropriate   Affect: Appropriate  Cognitive: Appropriate and Oriented  Insight: Developing/Improving and Improving  Engagement in Therapy: Improving  Modes of Intervention: Discussion, Exploration, Problem-solving and Support  Description of Group:  In this group patients will be encouraged to explore what they see as obstacles to their own wellness and recovery. They will be guided to discuss their thoughts, feelings, and behaviors related to these obstacles. The group will process together ways to cope with barriers, with attention given to specific choices patients can make. Each patient will be challenged to identify changes they are motivated to make in order to overcome their obstacles. This group will be process-oriented, with patients participating in exploration of their own experiences as well as giving and receiving support and challenge from other group members.  Summary of Patient Progress:  Pt states that the biggest obstacle he is facing is maintaining his recovery. Pt states that he would still like to discharge to [facility] and go through residential treatment. Pt states that he really doesn't want to go through residential treatment but he knows that it is the best chance he has at recovery so he will do it anyway.  Therapeutic Modalities:  Cognitive Behavioral Therapy Solution Focused Therapy Motivational Interviewing Relapse Prevention [place], MSW, [place]

## 2016-06-21 NOTE — Progress Notes (Signed)
Per pt request, CSW called [facility] to follow-up on the status of pt's referral. Intake worker states there are no beds available today but she will call this writer back. Awaiting call back.  Georga Kaufmann, MSW, [place]

## 2016-06-21 NOTE — Plan of Care (Signed)
Problem: Activity: Goal: Interest or engagement in activities will improve Outcome: Progressing Pt participates in most unit activities Goal: Sleeping patterns will improve Outcome: Progressing Reports improved sleep

## 2016-06-21 NOTE — Progress Notes (Signed)
[facility] MD Progress Note  06/21/2016 1:52 PM Mason Dibiasio  MRN:  [id number]  Subjective:  Patient reports he is doing better . He is still reporting some residual symptoms of alcohol WDL , primarily vague distal tremors, a mild sense of being jittery, and alcohol cravings. States , however, that he is feeling " better every day". Is experiencing alcohol cravings and states for example that he is ambivalent about a back pack he had prior to admission " I don't know if I lost it or not, but I am thinking of it partly because I know there is a bottle of liquor in there ". He is tolerating medications well .   Objective : I have discussed case with treatment team and I have met with patient . Presents improved compared to admission . Patient had presented with protracted and even worsening symptoms of alcohol WDL as he completed routine Ativan detox protocol, so that it was restarted/continued, to minimize risk of worsening detox or even emerging delirium. At this time improved , with only subtle distal tremors, vague anxiety. No visual disturbances, 0x 3, no evidence of delirium or confusion. Denies medication side effects. As he improves he is becoming more visible in day room, groups.   Principal Problem: Alcohol-induced mood disorder ([place]) Diagnosis:   Patient Active Problem List   Diagnosis Date Noted  . Alcohol-induced mood disorder ([place]) [F10.94] 06/15/2016  . Seizure ([place]) [R56.9] 05/26/2016  . Tobacco abuse [Z72.0] 05/26/2016  . Homeless [Z59.0] 05/26/2016  . Essential hypertension [I10] 05/26/2016  . MDD (major depressive disorder), recurrent severe, without psychosis ([place]) [F33.2] 03/06/2016  . Alcohol use disorder, severe, dependence ([place]) [F10.20] 02/14/2016  . Severe alcohol withdrawal without perceptual disturbances without complication ([place]) [L45.625] 02/14/2016  . Alcoholic hepatitis without ascites [K70.10] 02/14/2016  . Thrombocytopenia ([place]) [D69.6] 02/14/2016   Total  Time spent with patient: 20 minutes    Past Medical History:  Past Medical History:  Diagnosis Date  . Alcoholic hepatitis   . Depression   . ETOH abuse   . Hypertension   . Seizures ([place])     Past Surgical History:  Procedure Laterality Date  . HERNIA REPAIR    . LEG SURGERY     Family History:  Family History  Problem Relation Age of Onset  . Diabetes Mother   . Alcoholism Mother   . Emphysema Father   . Lung cancer Father   . Alcoholism Father    Social History:  History  Alcohol Use  . Yes    Comment: 5th liquor and one Forty daily       History  Drug Use No    Social History   Social History  . Marital status: Divorced    Spouse name: N/A  . Number of children: N/A  . Years of education: N/A   Social History Main Topics  . Smoking status: Current Every Day Smoker    Packs/day: 2.00    Types: Cigarettes  . Smokeless tobacco: Never Used  . Alcohol use Yes     Comment: 5th liquor and one Forty daily    . Drug use: No  . Sexual activity: Not Currently   Other Topics Concern  . None   Social History Narrative  . None   Additional Social History:    Pain Medications: denies Prescriptions: denies Over the Counter: denies History of alcohol / drug use?: Yes Longest period of sobriety (when/how long): "3 years when I was in prison" reports it was  from 2013-2016 Negative Consequences of Use: Personal relationships, Financial, Work / School Withdrawal Symptoms: Agitation, Sweats, Seizures, Tremors (Hx of seizures) Name of Substance 1: ETOH 1 - Age of First Use: teens 1 - Amount (size/oz): 1/5 of liquor and 1/5 of liquor and (3) 40 oz. beers 1 - Frequency: daily 1 - Duration: ongoing 1 - Last Use / Amount: yesterday  Sleep: improved  Appetite:  improving   Current Medications: Current Facility-Administered Medications  Medication Dose Route Frequency Provider Last Rate Last Dose  . acamprosate (CAMPRAL) tablet 666 mg  666 mg Oral TID WC  Jenne Campus, MD   666 mg at 06/21/16 1159  . alum & mag hydroxide-simeth (MAALOX/MYLANTA) 200-200-20 MG/5ML suspension 30 mL  30 mL Oral Q4H PRN Laverle Hobby, PA-C      . amLODipine (NORVASC) tablet 5 mg  5 mg Oral Daily Jenne Campus, MD   5 mg at 06/21/16 0801  . citalopram (CELEXA) tablet 10 mg  10 mg Oral Daily Jenne Campus, MD   10 mg at 06/21/16 0801  . feeding supplement (ENSURE ENLIVE) (ENSURE ENLIVE) liquid 237 mL  237 mL Oral BID BM Laverle Hobby, PA-C   237 mL at 06/20/16 1206  . gabapentin (NEURONTIN) capsule 100 mg  100 mg Oral TID Jenne Campus, MD   100 mg at 06/21/16 1159  . magnesium hydroxide (MILK OF MAGNESIA) suspension 30 mL  30 mL Oral Daily PRN Laverle Hobby, PA-C      . multivitamin with minerals tablet 1 tablet  1 tablet Oral Daily Laverle Hobby, PA-C   1 tablet at 06/21/16 0801  . nicotine polacrilex (NICORETTE) gum 2 mg  2 mg Oral PRN Laverle Hobby, PA-C      . thiamine (B-1) injection 100 mg  100 mg Intramuscular Once [employer], PA-C      . thiamine (VITAMIN B-1) tablet 100 mg  100 mg Oral Daily Laverle Hobby, PA-C   100 mg at 06/21/16 0801  . traZODone (DESYREL) tablet 50 mg  50 mg Oral QHS,MR X 1 Rozetta Nunnery, NP   50 mg at 06/20/16 2135   Lab Results: No results found for this or any previous visit (from the past 48 hour(s)).  Blood Alcohol level:  Lab Results  Component Value Date   ETH 114 (H) 06/15/2016   ETH 436 (HH) 32/44/0102   Metabolic Disorder Labs: Lab Results  Component Value Date   HGBA1C 5.2 03/07/2016   MPG 103 03/07/2016   Lab Results  Component Value Date   PROLACTIN 23.2 (H) 03/07/2016   Lab Results  Component Value Date   CHOL 215 (H) 03/07/2016   TRIG 82 03/07/2016   HDL 105 03/07/2016   CHOLHDL 2.0 03/07/2016   VLDL 16 03/07/2016   LDLCALC 94 03/07/2016    Physical Findings: AIMS: Facial and Oral Movements Muscles of Facial Expression: None, normal Lips and Perioral Area: None,  normal Jaw: None, normal Tongue: None, normal,Extremity Movements Upper (arms, wrists, hands, fingers): Mild (tremors) Lower (legs, knees, ankles, toes): None, normal, Trunk Movements Neck, shoulders, hips: None, normal, Overall Severity Severity of abnormal movements (highest score from questions above): Minimal Incapacitation due to abnormal movements: None, normal Patient's awareness of abnormal movements (rate only patient's report): Aware, no distress, Dental Status Current problems with teeth and/or dentures?: No Does patient usually wear dentures?: No  CIWA:  CIWA-Ar Total: 3 COWS:  COWS Total Score: 7  Musculoskeletal:  Strength & Muscle Tone: within normal limits Gait & Station: normal Patient leans: N/A  Psychiatric Specialty Exam: Physical Exam  Nursing note and vitals reviewed. Constitutional: He is oriented to person, place, and time. He appears well-developed.  Cardiovascular: Normal rate.   Neurological: He is alert and oriented to person, place, and time.  Skin: Skin is warm and dry.  Psychiatric: He has a normal mood and affect. His behavior is normal.      Review of Systems  Psychiatric/Behavioral: Positive for depression. The patient is nervous/anxious.   mild headache, intermittent- no chest pain, no shortness of breath, no vomiting   Blood pressure 133/83, pulse 82, temperature 98.2 F (36.8 C), temperature source Oral, resp. rate 18, height 5' 7" (1.702 m), weight 74.8 kg (165 lb), SpO2 100 %.Body mass index is 25.84 kg/m.  General Appearance: Fairly Groomed- improved compared to admission  Eye Contact:  Good  Speech:  Normal Rate  Volume:  Normal  Mood:  states he is feeling better   Affect:  more reactive   Thought Process:  Linear and Descriptions of Associations: Intact  Orientation:  Full (Time, Place, and Person)  Thought Content:  denies hallucinations,no delusions   Suicidal Thoughts:  No at this time denies suicidal or self injurious  ideations, denies homicidal or violent ideations  Homicidal Thoughts:  No  Memory:  Recent and remote grossly intact   Judgement:  Other:  improving   Insight:  improving  Psychomotor Activity:  Normal  Concentration:  Concentration: Good and Attention Span: Good  Recall:  Good  Fund of Knowledge:  Good  Language:  Good  Akathisia:  Negative  Handed:  Right  AIMS (if indicated):     Assets:  Desire for Improvement Resilience  ADL's:  Intact  Cognition:  WNL  Sleep:  Number of Hours: 6.5    Assessment - patient is reporting improvement and alcohol WDL symptoms, which had been persistent, are currently much improved after added days of Ativan taper. Mood is improved, remains vaguely anxious, partly related to cravings. Denies medication side effects.   Treatment Plan Summary: Treatment plan reviewed as below today 4/2.  Daily contact with patient to assess and evaluate symptoms and progress in treatment, Medication management, Plan inpatient treatment  and medications as below  -Continue to encourage group and milieu participation to work on coping skills and symptom reduction   He is currently completing  Ativan detox protocol to minimize symptoms of alcohol WDL .  Continue  Campral 666 mgrs TID for alcohol dependence history/ cravings  Increase Celexa to 20  mgrs QDAY for depression, anxiety  Increase  Neurontin to 200 mgrs TID for anxiety, pain  Increase Trazodone to 100  mgrs QHS PRN for insomnia as needed   Treatment team working on disposition planning options- I have continued to encourage patient to go to a residential rehab after discharge .     Jenne Campus, MD 06/21/2016, 1:52 PM  Patient ID: [name], male   DOB: [birth year], 51 y.o.   MRN: [id number]

## 2016-06-21 NOTE — Tx Team (Signed)
Interdisciplinary Treatment and Diagnostic Plan Update 06/21/2016 Time of Session: 9:30am  Barry Moore  MRN: [id number]  Principal Diagnosis: Alcohol-induced mood disorder ([place])  Secondary Diagnoses: Principal Problem:   Alcohol-induced mood disorder ([place])   Current Medications:  Current Facility-Administered Medications  Medication Dose Route Frequency Provider Last Rate Last Dose  . acamprosate (CAMPRAL) tablet 666 mg  666 mg Oral TID WC Jenne Campus, MD   666 mg at 06/21/16 1159  . alum & mag hydroxide-simeth (MAALOX/MYLANTA) 200-200-20 MG/5ML suspension 30 mL  30 mL Oral Q4H PRN Laverle Hobby, PA-C      . amLODipine (NORVASC) tablet 5 mg  5 mg Oral Daily Jenne Campus, MD   5 mg at 06/21/16 0801  . [START ON 06/22/2016] citalopram (CELEXA) tablet 20 mg  20 mg Oral Daily Myer Peer Cobos, MD      . feeding supplement (ENSURE ENLIVE) (ENSURE ENLIVE) liquid 237 mL  237 mL Oral BID BM Laverle Hobby, PA-C   237 mL at 06/21/16 1412  . gabapentin (NEURONTIN) capsule 200 mg  200 mg Oral TID Jenne Campus, MD      . magnesium hydroxide (MILK OF MAGNESIA) suspension 30 mL  30 mL Oral Daily PRN Laverle Hobby, PA-C      . multivitamin with minerals tablet 1 tablet  1 tablet Oral Daily Laverle Hobby, PA-C   1 tablet at 06/21/16 0801  . nicotine polacrilex (NICORETTE) gum 2 mg  2 mg Oral PRN Laverle Hobby, PA-C      . thiamine (B-1) injection 100 mg  100 mg Intramuscular Once [employer], PA-C      . thiamine (VITAMIN B-1) tablet 100 mg  100 mg Oral Daily Laverle Hobby, PA-C   100 mg at 06/21/16 0801  . traZODone (DESYREL) tablet 100 mg  100 mg Oral QHS,MR X 1 Jenne Campus, MD        PTA Medications: Prescriptions Prior to Admission  Medication Sig Dispense Refill Last Dose  . benztropine (COGENTIN) 0.5 MG tablet Take 1 tablet (0.5 mg total) by mouth 2 (two) times daily. (Patient not taking: Reported on 05/26/2016) 60 tablet 0 Not Taking at Unknown time  .  chlordiazePOXIDE (LIBRIUM) 5 MG capsule Take 1 capsule (5 mg total) by mouth 3 (three) times daily as needed for anxiety or withdrawal. (Patient not taking: Reported on 06/14/2016) 10 capsule 0 Not Taking at Unknown time  . FLUoxetine (PROZAC) 20 MG capsule Take 1 capsule (20 mg total) by mouth daily. (Patient not taking: Reported on 05/26/2016) 30 capsule 0 Not Taking at Unknown time  . gabapentin (NEURONTIN) 300 MG capsule Take 1 capsule (300 mg total) by mouth 3 (three) times daily at 8am, 3pm and bedtime. (Patient not taking: Reported on 05/26/2016) 90 capsule 0 Not Taking at Unknown time  . haloperidol (HALDOL) 5 MG tablet Take 1 tablet (5 mg total) by mouth 2 (two) times daily. (Patient not taking: Reported on 05/26/2016) 60 tablet 0 Not Taking at Unknown time  . hydrOXYzine (ATARAX/VISTARIL) 25 MG tablet Take 1 tablet (25 mg total) by mouth every 6 (six) hours as needed (anxiety/agitation or CIWA < or = 10). (Patient not taking: Reported on 05/26/2016) 30 tablet 0 Not Taking at Unknown time  . mirtazapine (REMERON) 30 MG tablet Take 1 tablet (30 mg total) by mouth at bedtime. (Patient not taking: Reported on 05/26/2016) 30 tablet 0 Not Taking at Unknown time  . Multiple Vitamin (MULTIVITAMIN)  capsule Take 1 capsule by mouth daily. (Patient not taking: Reported on 06/14/2016) 30 capsule 0 Not Taking at Unknown time  . nicotine polacrilex (NICORETTE) 2 MG gum Take 1 each (2 mg total) by mouth as needed for smoking cessation. (Patient not taking: Reported on 05/26/2016) 100 tablet 0 Not Taking at Unknown time    Treatment Modalities: Medication Management, Group therapy, Case management,  1 to 1 session with clinician, Psychoeducation, Recreational therapy.  Patient Stressors: Financial difficulties Loss of relationship with girlfriend Medication change or noncompliance Substance abuse Patient Strengths: Ability for insight Average or above average intelligence Curator fund of  knowledge  Physician Treatment Plan for Primary Diagnosis: Alcohol-induced mood disorder ([place]) Long Term Goal(s): Improvement in symptoms so as ready for discharge Short Term Goals: Ability to verbalize feelings will improve Ability to disclose and discuss suicidal ideas Ability to demonstrate self-control will improve Ability to identify and develop effective coping behaviors will improve Ability to maintain clinical measurements within normal limits will improve Ability to identify triggers associated with substance abuse/mental health issues will improve  Medication Management: Evaluate patient's response, side effects, and tolerance of medication regimen.  Therapeutic Interventions: 1 to 1 sessions, Unit Group sessions and Medication administration.  Evaluation of Outcomes: Progressing  Physician Treatment Plan for Secondary Diagnosis: Principal Problem:   Alcohol-induced mood disorder ([place])  Long Term Goal(s): Improvement in symptoms so as ready for discharge  Short Term Goals: Ability to verbalize feelings will improve Ability to disclose and discuss suicidal ideas Ability to demonstrate self-control will improve Ability to identify and develop effective coping behaviors will improve Ability to maintain clinical measurements within normal limits will improve Ability to identify triggers associated with substance abuse/mental health issues will improve  Medication Management: Evaluate patient's response, side effects, and tolerance of medication regimen.  Therapeutic Interventions: 1 to 1 sessions, Unit Group sessions and Medication administration.  Evaluation of Outcomes: Progressing  RN Treatment Plan for Primary Diagnosis: Alcohol-induced mood disorder ([place]) Long Term Goal(s): Knowledge of disease and therapeutic regimen to maintain health will improve  Short Term Goals: Ability to disclose and discuss suicidal ideas and Compliance with prescribed medications will  improve  Medication Management: RN will administer medications as ordered by provider, will assess and evaluate patient's response and provide education to patient for prescribed medication. RN will report any adverse and/or side effects to prescribing provider.  Therapeutic Interventions: 1 on 1 counseling sessions, Psychoeducation, Medication administration, Evaluate responses to treatment, Monitor vital signs and CBGs as ordered, Perform/monitor CIWA, COWS, AIMS and Fall Risk screenings as ordered, Perform wound care treatments as ordered.  Evaluation of Outcomes: Progressing  LCSW Treatment Plan for Primary Diagnosis: Alcohol-induced mood disorder ([place]) Long Term Goal(s): Safe transition to appropriate next level of care at discharge, Engage patient in therapeutic group addressing interpersonal concerns. Short Term Goals: Engage patient in aftercare planning with referrals and resources, Facilitate patient progression through stages of change regarding substance use diagnoses and concerns, Identify triggers associated with mental health/substance abuse issues and Increase skills for wellness and recovery  Therapeutic Interventions: Assess for all discharge needs, 1 to 1 time with Social worker, Explore available resources and support systems, Assess for adequacy in community support network, Educate family and significant other(s) on suicide prevention, Complete Psychosocial Assessment, Interpersonal group therapy.  Evaluation of Outcomes: Progressing  Progress in Treatment: Attending groups: Yes Participating in groups: Yes Taking medication as prescribed: Yes, MD continues to assess for medication changes as needed Toleration medication: Yes,  no side effects reported at this time Family/Significant other contact made: No, pt declined. Patient understands diagnosis: Developing insight Discussing patient identified problems/goals with staff: Yes Medical problems stabilized or resolved:  Yes Denies suicidal/homicidal ideation: Yes Issues/concerns per patient self-inventory: None Other: N/A  New problem(s) identified: None identified at this time.   New Short Term/Long Term Goal(s): None identified at this time.   Discharge Plan or Barriers: Pt plans to discharge to [facility] or [facility].  Reason for Continuation of Hospitalization:  Anxiety  Depression Medication stabilization Suicidal ideation Withdrawal symptoms  Estimated Length of Stay: 2-4 days  Attendees: Patient: 06/21/2016 3:06 PM  Physician: Dr. Parke Poisson 06/21/2016 3:06 PM  Nursing: Trinna Post RN; Chrys Racer, RN 06/21/2016 3:06 PM  RN Care Manager: Lars Pinks, RN 06/21/2016 3:06 PM  Social Worker: Matthew Saras, [place] 06/21/2016 3:06 PM  Recreational Therapist:  06/21/2016 3:06 PM  Other: Lindell Spar, NP; Samuel Jester, NP 06/21/2016 3:06 PM  Other:  06/21/2016 3:06 PM  Other: 06/21/2016 3:06 PM   Scribe for Treatment Team: Georga Kaufmann, MSW,LCSWA 06/21/2016 3:06 PM

## 2016-06-21 NOTE — BHH Group Notes (Signed)
[facility] LCSW Aftercare Discharge Planning Group Note   06/21/2016 12:21 PM  Participation Quality: Pt invited. Did not attend.   Georga Kaufmann, MSW, Latanya Presser

## 2016-06-21 NOTE — Progress Notes (Signed)
Patient ID: Barry Moore, male   DOB: 05-02-1965, 51 y.o.   MRN: [id number]  D.Patient complained of slightly blurred vision and a headache. Denies SI and HI at this time. Has slept in some this AM. Affect brighter at noon than this AM. A. Meds given as ordered.  R. Patient cooperative.

## 2016-06-21 NOTE — Progress Notes (Signed)
Recreation Therapy Notes  Date: 06/21/16 Time: 0930 Location: [address] Dayroom  Group Topic: Stress Management  Goal Area(s) Addresses:  Patient will verbalize importance of using healthy stress management.  Patient will identify positive emotions associated with healthy stress management.   Intervention: Stress Management  Activity :  LRT introduced the stress management technique of guided imagery.  LRT read a script to engage patients in guided imagery.  Patients were to follow along as the script was read in order to fully participate in the technique.  Education:  Stress Management, Discharge Planning.   Education Outcome: Acknowledges edcuation/In group clarification offered/Needs additional education  Clinical Observations/Feedback: Pt did not attend group.   Victorino Sparrow, LRT/CTRS         Victorino Sparrow A 06/21/2016 12:06 PM

## 2016-06-21 NOTE — Progress Notes (Signed)
Adult Psychoeducational Group Note  Date:  06/21/2016 Time:  8:41 PM  Group Topic/Focus:  Wrap-Up Group:   The focus of this group is to help patients review their daily goal of treatment and discuss progress on daily workbooks.  Participation Level:  Active  Participation Quality:  Appropriate and Attentive  Affect:  Appropriate  Cognitive:  Appropriate  Insight: Appropriate  Engagement in Group:  Engaged  Modes of Intervention:  Discussion  Additional Comments:  Pt attended group, pt stated he did not have a goal, nothing really happens around here, but he did make it through another day. Pt stated something positive for today is that he is trying to get into [facility].  Clint Bolder 06/21/2016, 8:41 PM

## 2016-06-22 ENCOUNTER — Ambulatory Visit: Payer: Self-pay | Admitting: Family Medicine

## 2016-06-22 NOTE — Progress Notes (Signed)
Pt attend group his day was a 10. Pt said he did not have a goal.

## 2016-06-22 NOTE — Progress Notes (Signed)
D:  Patient's self inventory sheet, patient has fair sleep, no sleep medication given.  Good appetite, normal energy level, poor concentration.  Rated depression 5, hopeless 3, denied anxiety.  Withdrawals, runny nose, irritability.  Denied SI.  Physical problems, headaches.  Denied physical pain.  Goal is discharge.  Plans to talk to MD.  Does have discharge plans. A:  Medications administered per MD orders.  Emotional support and encouragement given patient. R:  Denied SI and HI, contracts for safety.  Denied A/V hallucinations. Safety maintained with 15 minute checks.

## 2016-06-22 NOTE — Progress Notes (Signed)
Pt has a [facility] screening date on Monday 4/9 @7 :45am. CSW also called [facility] to see if there are beds available. [facility] will not have any beds available until Thursday at the earliest.   Georga Kaufmann, MSW, [place]

## 2016-06-22 NOTE — Progress Notes (Signed)
[name] has been up and visible in milieu this evening, attended and participated in group activity. He spoke about feeling good, reports no signs or symptoms of withdrawal and does not appear in any acute withdrawal. He spoke about hopefully going to [facility] soon, did not verbalize any complaints of pain and did receive bedtime medication without incident. A. Support and encouragement provided, R. Safety maintained, will continue to monitor.

## 2016-06-22 NOTE — [facility] Group Notes (Signed)
The focus of this group is to educate the patient on the purpose and policies of crisis stabilization and provide a format to answer questions about their admission.  The group details unit policies and expectations of patients while admitted.  Patient attended 0900 nurse education orientation group this morning.  Patient actively participated and had appropriate affect.  Patient was alert.  Patient had appropriate inisight and appropriate engagement.  Today patient will work on 3 goals for discharge.

## 2016-06-22 NOTE — Plan of Care (Signed)
Problem: Education: Goal: Emotional status will improve Outcome: Progressing Nurse discussed depression/coping skills with patient.

## 2016-06-22 NOTE — Progress Notes (Signed)
[facility] MD Progress Note  06/22/2016 3:35 PM Barry Moore  MRN:  [id number]  Subjective:  Patient is feeling better, and residual symptoms of alcohol withdrawal have abated/resolved. At this time denies any active alcohol WDL symptoms. Denies medication side effects. .   Objective : I have discussed case with treatment team and I have met with patient . Patient is presenting with improved mood and range of affect. As reviewed in prior notes, patient had a somewhat protracted alcohol WDL with worsening /recurrence of withdrawal symptoms as Ativan detox protocol progressed, necessitating longer Ativan taper. At this time, however, he is not presenting with any current symptoms of alcohol WDL, and residual anxiety, jitteriness have tended to resolve. Denies medication side effects. Less alcohol cravings today. He is interested in going to a Rehab residential program after discharge but is concerned about possible rules regarding not being able to smoke there. We discussed and stated " I guess it's not a big deal, I have not been smoking here ".  No disruptive or agitated behaviors on unit, going to some groups .   Principal Problem: Alcohol-induced mood disorder ([place]) Diagnosis:   Patient Active Problem List   Diagnosis Date Noted  . Alcohol-induced mood disorder ([place]) [F10.94] 06/15/2016  . Seizure ([place]) [R56.9] 05/26/2016  . Tobacco abuse [Z72.0] 05/26/2016  . Homeless [Z59.0] 05/26/2016  . Essential hypertension [I10] 05/26/2016  . MDD (major depressive disorder), recurrent severe, without psychosis ([place]) [F33.2] 03/06/2016  . Alcohol use disorder, severe, dependence ([place]) [F10.20] 02/14/2016  . Severe alcohol withdrawal without perceptual disturbances without complication ([place]) [W23.762] 02/14/2016  . Alcoholic hepatitis without ascites [K70.10] 02/14/2016  . Thrombocytopenia ([place]) [D69.6] 02/14/2016   Total Time spent with patient: 20 minutes   Past Medical History:  Past Medical  History:  Diagnosis Date  . Alcoholic hepatitis   . Depression   . ETOH abuse   . Hypertension   . Seizures ([place])     Past Surgical History:  Procedure Laterality Date  . HERNIA REPAIR    . LEG SURGERY     Family History:  Family History  Problem Relation Age of Onset  . Diabetes Mother   . Alcoholism Mother   . Emphysema Father   . Lung cancer Father   . Alcoholism Father    Social History:  History  Alcohol Use  . Yes    Comment: 5th liquor and one Forty daily       History  Drug Use No    Social History   Social History  . Marital status: Divorced    Spouse name: N/A  . Number of children: N/A  . Years of education: N/A   Social History Main Topics  . Smoking status: Current Every Day Smoker    Packs/day: 2.00    Types: Cigarettes  . Smokeless tobacco: Never Used  . Alcohol use Yes     Comment: 5th liquor and one Forty daily    . Drug use: No  . Sexual activity: Not Currently   Other Topics Concern  . None   Social History Narrative  . None   Additional Social History:    Pain Medications: denies Prescriptions: denies Over the Counter: denies History of alcohol / drug use?: Yes Longest period of sobriety (when/how long): "3 years when I was in prison" reports it was from 2013-2016 Negative Consequences of Use: Personal relationships, Museum/gallery curator, Work / School Withdrawal Symptoms: Agitation, Sweats, Seizures, Tremors (Hx of seizures) Name of Substance 1: ETOH 1 -  Age of First Use: teens 1 - Amount (size/oz): 1/5 of liquor and 1/5 of liquor and (3) 40 oz. beers 1 - Frequency: daily 1 - Duration: ongoing 1 - Last Use / Amount: yesterday  Sleep: Good  Appetite:  Good  Current Medications: Current Facility-Administered Medications  Medication Dose Route Frequency Provider Last Rate Last Dose  . acamprosate (CAMPRAL) tablet 666 mg  666 mg Oral TID WC Jenne Campus, MD   666 mg at 06/22/16 1137  . alum & mag hydroxide-simeth  (MAALOX/MYLANTA) 200-200-20 MG/5ML suspension 30 mL  30 mL Oral Q4H PRN Laverle Hobby, PA-C      . amLODipine (NORVASC) tablet 5 mg  5 mg Oral Daily Jenne Campus, MD   5 mg at 06/22/16 0834  . citalopram (CELEXA) tablet 20 mg  20 mg Oral Daily Jenne Campus, MD   20 mg at 06/22/16 0834  . feeding supplement (ENSURE ENLIVE) (ENSURE ENLIVE) liquid 237 mL  237 mL Oral BID BM Maurine Minister Simon, PA-C   237 mL at 06/22/16 1137  . gabapentin (NEURONTIN) capsule 200 mg  200 mg Oral TID Jenne Campus, MD   200 mg at 06/22/16 1136  . magnesium hydroxide (MILK OF MAGNESIA) suspension 30 mL  30 mL Oral Daily PRN Laverle Hobby, PA-C      . multivitamin with minerals tablet 1 tablet  1 tablet Oral Daily Laverle Hobby, PA-C   1 tablet at 06/22/16 9675  . nicotine polacrilex (NICORETTE) gum 2 mg  2 mg Oral PRN Laverle Hobby, PA-C      . thiamine (B-1) injection 100 mg  100 mg Intramuscular Once Spencer E Simon, PA-C      . thiamine (VITAMIN B-1) tablet 100 mg  100 mg Oral Daily Laverle Hobby, PA-C   100 mg at 06/22/16 0835  . traZODone (DESYREL) tablet 100 mg  100 mg Oral QHS,MR X 1 Jenne Campus, MD   100 mg at 06/21/16 2131   Lab Results: No results found for this or any previous visit (from the past 48 hour(s)).  Blood Alcohol level:  Lab Results  Component Value Date   ETH 114 (H) 06/15/2016   ETH 436 (HH) 91/63/8466   Metabolic Disorder Labs: Lab Results  Component Value Date   HGBA1C 5.2 03/07/2016   MPG 103 03/07/2016   Lab Results  Component Value Date   PROLACTIN 23.2 (H) 03/07/2016   Lab Results  Component Value Date   CHOL 215 (H) 03/07/2016   TRIG 82 03/07/2016   HDL 105 03/07/2016   CHOLHDL 2.0 03/07/2016   VLDL 16 03/07/2016   LDLCALC 94 03/07/2016    Physical Findings: AIMS: Facial and Oral Movements Muscles of Facial Expression: None, normal Lips and Perioral Area: None, normal Jaw: None, normal Tongue: None, normal,Extremity Movements Upper (arms,  wrists, hands, fingers): Mild (tremors) Lower (legs, knees, ankles, toes): None, normal, Trunk Movements Neck, shoulders, hips: None, normal, Overall Severity Severity of abnormal movements (highest score from questions above): Minimal Incapacitation due to abnormal movements: None, normal Patient's awareness of abnormal movements (rate only patient's report): Aware, no distress, Dental Status Current problems with teeth and/or dentures?: No Does patient usually wear dentures?: No  CIWA:  CIWA-Ar Total: 1 COWS:  COWS Total Score: 7  Musculoskeletal: Strength & Muscle Tone: within normal limits Gait & Station: normal Patient leans: N/A  Psychiatric Specialty Exam: Physical Exam  Nursing note and vitals reviewed. Constitutional: He is  oriented to person, place, and time. He appears well-developed.  Cardiovascular: Normal rate.   Neurological: He is alert and oriented to person, place, and time.  Skin: Skin is warm and dry.  Psychiatric: He has a normal mood and affect. His behavior is normal.      Review of Systems  Psychiatric/Behavioral: Positive for depression. The patient is nervous/anxious.   no headache, no chest pain, no shortness of breath. No vomiting   Blood pressure 128/84, pulse 74, temperature 98.1 F (36.7 C), temperature source Oral, resp. rate 18, height 5' 7" (1.702 m), weight 74.8 kg (165 lb), SpO2 100 %.Body mass index is 25.84 kg/m.  General Appearance: improved grooming   Eye Contact:  Good  Speech:  Normal Rate  Volume:  Normal  Mood:  Euthymic  Affect:  Full Range  Thought Process:  Goal Directed and Descriptions of Associations: Intact  Orientation:  Other:  fully alert and attentive   Thought Content:  no hallucinations,. no delusions   Suicidal Thoughts:  No at this time denies suicidal or self injurious ideations, denies homicidal or violent ideations  Homicidal Thoughts:  No  Memory:  Recent and remote grossly intact   Judgement:  Other:   improving   Insight:  Present  Psychomotor Activity:  Normal no residual symptoms of alcohol WDL at this time- no tremors, no diaphoresis, no restlessness   Concentration:  Concentration: Good and Attention Span: Good  Recall:  Good  Fund of Knowledge:  Good  Language:  Good  Akathisia:  No  Handed:  Right  AIMS (if indicated):     Assets:  Desire for Improvement Resilience  ADL's:  Intact  Cognition:  WNL  Sleep:  Number of Hours: 6.5    Assessment - patient is improved compared to admission. No residual symptoms of alcohol WDL at this time , mood much improved and currently euthymic, and future oriented.   Treatment Plan Summary:   Treatment plan reviewed as below today 4/3 .  Daily contact with patient to assess and evaluate symptoms and progress in treatment, Medication management, Plan inpatient treatment  and medications as below  -Continue to encourage group and milieu participation to work on coping skills and symptom reduction     Continue  Campral 666 mgrs TID for alcohol dependence history/ cravings  Continue  Celexa 20  mgrs QDAY for depression, anxiety  Continue  Neurontin  200 mgrs TID for anxiety, pain  Continue Trazodone  100  mgrs QHS PRN for insomnia as needed    At this time focus is on discharge planning, CSW has sent out  referrals to rehab settings .     Jenne Campus, MD 06/22/2016, 3:35 PM  Patient ID: Barry Moore, male   DOB: [birth year], 51 y.o.   MRN: [id number]

## 2016-06-22 NOTE — B[facility] Group Notes (Signed)
[place] LCSW Group Therapy 06/22/2016 1:15 PM  Type of Therapy: Group Therapy- Feelings about Diagnosis  Participation Level: Active   Participation Quality:  Appropriate  Affect:  Appropriate  Cognitive: Alert and Oriented   Insight:  Developing   Engagement in Therapy: Developing/Improving and Engaged   Modes of Intervention: Clarification, Confrontation, Discussion, Education, Exploration, Limit-setting, Orientation, Problem-solving, Rapport Building, Art therapist, Socialization and Support  Description of Group:   This group will allow patients to explore their thoughts and feelings about diagnoses they have received. Patients will be guided to explore their level of understanding and acceptance of these diagnoses. Facilitator will encourage patients to process their thoughts and feelings about the reactions of others to their diagnosis, and will guide patients in identifying ways to discuss their diagnosis with significant others in their lives. This group will be process-oriented, with patients participating in exploration of their own experiences as well as giving and receiving support and challenge from other group members.  Summary of Progress/Problems:   Pt states that he receives a new diagnosis almost every time he comes into the hospital. Pt reported that he does feel the diagnosis of MDD and Alcohol Use Disorder is accurate for him. Pt mentioned that he does not share his diagnosis with any of his friends because he feels it is personal and something that a lot of people do not understand.  Therapeutic Modalities:   Cognitive Behavioral Therapy Solution Focused Therapy Motivational Interviewing Relapse Prevention Therapy  Georga Kaufmann, MSW, [facility] 06/22/2016 3:41 PM

## 2016-06-22 NOTE — [facility] Group Notes (Signed)
Pt attended spiritual care group on grief and loss facilitated by chaplain Jerene Pitch   Group opened with brief discussion and psycho-social ed around grief and loss in relationships and in relation to self - identifying life patterns, circumstances, changes that cause losses. Established group norm of speaking from own life experience. Group goal of establishing open and affirming space for members to share loss and experience with grief, normalize grief experience and provide psycho social education and grief support.    Barry Moore, who introduced himself as buddy, was present through group with exception of being pulled to speak with treatment team.  He returned to group afterward.  He was attentive to group members.  Did not contribute to group discussion.    Barry Moore, [place]

## 2016-06-23 MED ORDER — CITALOPRAM HYDROBROMIDE 20 MG PO TABS: 20.0000 mg | ORAL_TABLET | Freq: Every day | ORAL | 0 refills | Status: DC

## 2016-06-23 MED ORDER — AMLODIPINE BESYLATE 5 MG PO TABS: 5.0000 mg | ORAL_TABLET | Freq: Every day | ORAL | 0 refills | Status: DC

## 2016-06-23 MED ORDER — ACAMPROSATE CALCIUM 333 MG PO TBEC: 666.0000 mg | DELAYED_RELEASE_TABLET | Freq: Three times a day (TID) | ORAL | 0 refills | Status: DC

## 2016-06-23 MED ORDER — TRAZODONE HCL 100 MG PO TABS: 100.0000 mg | ORAL_TABLET | Freq: Every evening | ORAL | 0 refills | Status: DC | PRN

## 2016-06-23 MED ORDER — NICOTINE POLACRILEX 2 MG MT GUM: 2.0000 mg | CHEWING_GUM | OROMUCOSAL | 0 refills | Status: DC | PRN

## 2016-06-23 MED ORDER — GABAPENTIN 100 MG PO CAPS: 200.0000 mg | ORAL_CAPSULE | Freq: Three times a day (TID) | ORAL | 0 refills | Status: DC

## 2016-06-23 NOTE — B[facility] Suicide Risk Assessment (Signed)
[facility] Discharge Suicide Risk Assessment   Principal Problem: Alcohol-induced mood disorder [facility]) Discharge Diagnoses:  Patient Active Problem List   Diagnosis Date Noted  . Alcohol-induced mood disorder ([place]) [F10.94] 06/15/2016  . Seizure ([place]) [R56.9] 05/26/2016  . Tobacco abuse [Z72.0] 05/26/2016  . Homeless [Z59.0] 05/26/2016  . Essential hypertension [I10] 05/26/2016  . MDD (major depressive disorder), recurrent severe, without psychosis ([place]) [F33.2] 03/06/2016  . Alcohol use disorder, severe, dependence ([place]) [F10.20] 02/14/2016  . Severe alcohol withdrawal without perceptual disturbances without complication ([place]) [T55.732] 02/14/2016  . Alcoholic hepatitis without ascites [K70.10] 02/14/2016  . Thrombocytopenia ([place]) [D69.6] 02/14/2016    Total Time spent with patient: 30 minutes  Musculoskeletal: Strength & Muscle Tone: within normal limits Gait & Station: normal Patient leans: N/A  Psychiatric Specialty Exam: ROS no chest pain, no shortness of breath  Blood pressure 114/75, pulse 87, temperature 98.2 F (36.8 C), temperature source Oral, resp. rate 18, height 5\' 7"  (1.702 m), weight 74.8 kg (165 lb), SpO2 100 %.Body mass index is 25.84 kg/m.  General Appearance: improved grooming  Eye Contact::  Good  Speech:  Normal Rate409  Volume:  Normal  Mood:  improved mood, currently euthymic, denies depression   Affect:  Appropriate and Full Range  Thought Process:  Linear and Descriptions of Associations: Intact  Orientation:  Full (Time, Place, and Person)  Thought Content:  denies hallucinations, no delusions , not internally preoccupied   Suicidal Thoughts:  No denies any suicidal or self injurious ideations, denies any homicidal or violent ideations   Homicidal Thoughts:  No  Memory:  recent and remote grossly intact  Judgement:  Other:  improving   Insight:  improving  Psychomotor Activity:  Normal- no residual symptoms of WDL   Concentration:  Good  Recall:  Good   Fund of Knowledge:Good  Language: Good  Akathisia:  Negative  Handed:  Right  AIMS (if indicated):     Assets:  Communication Skills Desire for Improvement Resilience  Sleep:  Number of Hours: 6.5  Cognition: WNL  ADL's:  Intact   Mental Status Per Nursing Assessment::   On Admission:     Demographic Factors:  51 year old male, currently homeless  Loss Factors: Homelessness, limited support network, alcohol dependence  Historical Factors: History of alcohol dependence. History of depression  Risk Reduction Factors:   Positive coping skills or problem solving skills  Continued Clinical Symptoms:  At this time patient is much improved compared to admission- no residual WDL symptoms. Mood improved and currently presents euthymic, with full range of affect. No thought disorder, no suicidal or self injurious ideations, no psychotic symptoms. Behavior on unit in good control. Denies medication side effects  Cognitive Features That Contribute To Risk:  No gross cognitive deficits noted upon discharge. Is alert , attentive, and oriented x 3   Suicide Risk:  Mild:  Suicidal ideation of limited frequency, intensity, duration, and specificity.  There are no identifiable plans, no associated intent, mild dysphoria and related symptoms, good self-control (both objective and subjective assessment), few other risk factors, and identifiable protective factors, including available and accessible social support.  Follow-up Information    [facility] Follow up on 06/18/2016.   Why:  You have been accepted for residential treatment at [facility]. The driver will pick you up at 11AM. Please have 21 day supply of medications.  Contact information: [place].  [place], [place]  [place]: [phone] F: [phone]  Plan Of Care/Follow-up recommendations:  Activity:  as tolerated  Diet:  Regular Tests:  NA Other:  See below Patient is requesting to discharge today and there are no  grounds for involuntary commitment at this time Encouraged to remain in hospital until a bed available at [facility] or similar, but at this time prefers to be discharged. States he plans to follow up , maintain sobriety. 12 step participation encouraged . Jenne Campus, MD 06/23/2016, 11:14 AM

## 2016-06-23 NOTE — Discharge Summary (Signed)
Physician Discharge Summary Note  Patient:  Barry Moore is an 51 y.o., male MRN:  [id number] DOB:  September 17, 1965 Patient phone:  [phone] (home)  Patient address:   [place] [address],  Total Time spent with patient: 30 minutes  Date of Admission:  06/15/2016 Date of Discharge: 06/23/2016  Reason for Admission:  Substance abuse  Principal Problem: Alcohol-induced mood disorder [facility]) Discharge Diagnoses: Patient Active Problem List   Diagnosis Date Noted  . Alcohol-induced mood disorder ([place]) [F10.94] 06/15/2016  . Seizure ([place]) [R56.9] 05/26/2016  . Tobacco abuse [Z72.0] 05/26/2016  . Homeless [Z59.0] 05/26/2016  . Essential hypertension [I10] 05/26/2016  . MDD (major depressive disorder), recurrent severe, without psychosis ([place]) [F33.2] 03/06/2016  . Alcohol use disorder, severe, dependence ([place]) [F10.20] 02/14/2016  . Severe alcohol withdrawal without perceptual disturbances without complication ([place]) [O16.073] 02/14/2016  . Alcoholic hepatitis without ascites [K70.10] 02/14/2016  . Thrombocytopenia ([place]) [D69.6] 02/14/2016    Past Psychiatric History: see HPI  Past Medical History:  Past Medical History:  Diagnosis Date  . Alcoholic hepatitis   . Depression   . ETOH abuse   . Hypertension   . Seizures ([place])     Past Surgical History:  Procedure Laterality Date  . HERNIA REPAIR    . LEG SURGERY     Family History:  Family History  Problem Relation Age of Onset  . Diabetes Mother   . Alcoholism Mother   . Emphysema Father   . Lung cancer Father   . Alcoholism Father    Family Psychiatric  History: see HPI Social History:  History  Alcohol Use  . Yes    Comment: 5th liquor and one Forty daily       History  Drug Use No    Social History   Social History  . Marital status: Divorced    Spouse name: N/A  . Number of children: N/A  . Years of education: N/A   Social History Main Topics  . Smoking status: Current Every Day Smoker     Packs/day: 2.00    Types: Cigarettes  . Smokeless tobacco: Never Used  . Alcohol use Yes     Comment: 5th liquor and one Forty daily    . Drug use: No  . Sexual activity: Not Currently   Other Topics Concern  . None   Social History Narrative  . None    Hospital Course:  Barry Moore, 51 year old male, currently homeless . Presented to ED voluntarily, after a friend called EMS due to seizure like symptoms in the context of alcohol WDL .  Admission BAL ( 3/26) was 436. UDS was negative.  Barry Moore was admitted for Alcohol-induced mood disorder ([place]) and crisis management.  Patient was treated with medications with their indications listed below in detail under Medication List.  Medical problems were identified and treated as needed.  Home medications were restarted as appropriate.  Improvement was monitored by observation and Barry Moore.  Emotional and mental status was monitored by daily self inventory reports completed by Barry Moore and clinical staff.  Patient reported continued improvement, denied any new concerns.  Patient had been compliant on medications and denied side effects.  Support and encouragement was provided.    Patient encouraged to attend groups to help with recognizing triggers of emotional crises and de-stabilizations.  Patient encouraged to attend group to help identify the positive things in life that would help in dealing with feelings of loss, depression  and unhealthy or abusive tendencies.         Barry Moore was evaluated by the treatment team for stability and plans for continued recovery upon discharge.  Patient was offered further treatment options upon discharge including Residential, Intensive Outpatient and Outpatient treatment. Patient will follow up with agency listed below for medication management and counseling.  Encouraged patient to maintain satisfactory support network and home environment.  Advised to  adhere to medication compliance and outpatient treatment follow up.  Prescriptions provided.       Barry Moore motivation was an integral factor for scheduling further treatment.  Employment, transportation, bed availability, health status, family support, and any pending legal issues were also considered during patient's hospital stay.  Upon completion of this admission the patient was both mentally and medically stable for discharge denying suicidal/homicidal ideation, auditory/visual/tactile hallucinations, delusional thoughts and paranoia.      Physical Findings: AIMS: Facial and Oral Movements Muscles of Facial Expression: None, normal Lips and Perioral Area: None, normal Jaw: None, normal Tongue: None, normal,Extremity Movements Upper (arms, wrists, hands, fingers): None, normal Lower (legs, knees, ankles, toes): None, normal, Trunk Movements Neck, shoulders, hips: None, normal, Overall Severity Severity of abnormal movements (highest score from questions above): None, normal Incapacitation due to abnormal movements: None, normal Patient's awareness of abnormal movements (rate only patient's report): No Awareness, Dental Status Current problems with teeth and/or dentures?: No Does patient usually wear dentures?: No  CIWA:  CIWA-Ar Total: 0 COWS:  COWS Total Score: 1  Musculoskeletal: Strength & Muscle Tone: within normal limits Gait & Station: normal Patient leans: N/A  Psychiatric Specialty Exam:  SEE MD SRA Physical Exam  Nursing note and vitals reviewed.   ROS  Blood pressure 114/75, pulse 87, temperature 98.2 F (36.8 C), temperature source Oral, resp. rate 18, height 5\' 7"  (1.702 m), weight 74.8 kg (165 lb), SpO2 100 %.Body mass index is 25.84 kg/m.    Have you used any form of tobacco in the last 30 days? (Cigarettes, Smokeless Tobacco, Cigars, and/or Pipes): Yes  Has this patient used any form of tobacco in the last 30 days? (Cigarettes, Smokeless Tobacco, Cigars,  and/or Pipes) Yes, N/A  Blood Alcohol level:  Lab Results  Component Value Date   ETH 114 (H) 06/15/2016   ETH 436 (HH) 22/97/9892    Metabolic Disorder Labs:  Lab Results  Component Value Date   HGBA1C 5.2 03/07/2016   MPG 103 03/07/2016   Lab Results  Component Value Date   PROLACTIN 23.2 (H) 03/07/2016   Lab Results  Component Value Date   CHOL 215 (H) 03/07/2016   TRIG 82 03/07/2016   HDL 105 03/07/2016   CHOLHDL 2.0 03/07/2016   VLDL 16 03/07/2016   LDLCALC 94 03/07/2016    See Psychiatric Specialty Exam and Suicide Risk Assessment completed by Attending Physician prior to discharge.  Discharge destination:  Home  Is patient on multiple antipsychotic therapies at discharge:  No   Has Patient had three or more failed trials of antipsychotic monotherapy by history:  No  Recommended Plan for Multiple Antipsychotic Therapies: NA   Allergies as of 06/23/2016      Reactions   Aspirin Nausea And Vomiting      Medication List    STOP taking these medications   benztropine 0.5 MG tablet Commonly known as:  COGENTIN   chlordiazePOXIDE 5 MG capsule Commonly known as:  LIBRIUM   FLUoxetine 20 MG capsule Commonly known as:  PROZAC   haloperidol  5 MG tablet Commonly known as:  HALDOL   hydrOXYzine 25 MG tablet Commonly known as:  ATARAX/VISTARIL   mirtazapine 30 MG tablet Commonly known as:  REMERON   multivitamin capsule     TAKE these medications     Indication  acamprosate 333 MG tablet Commonly known as:  CAMPRAL Take 2 tablets (666 mg total) by mouth 3 (three) times daily with meals.  Indication:  Excessive Use of Alcohol   amLODipine 5 MG tablet Commonly known as:  NORVASC Take 1 tablet (5 mg total) by mouth daily. Start taking on:  06/24/2016  Indication:  High Blood Pressure Disorder   citalopram 20 MG tablet Commonly known as:  CELEXA Take 1 tablet (20 mg total) by mouth daily. Start taking on:  06/24/2016  Indication:  Depression    gabapentin 100 MG capsule Commonly known as:  NEURONTIN Take 2 capsules (200 mg total) by mouth 3 (three) times daily. What changed:  medication strength  how much to take  when to take this  Indication:  Alcohol Withdrawal Syndrome   nicotine polacrilex 2 MG gum Commonly known as:  NICORETTE Take 1 each (2 mg total) by mouth as needed for smoking cessation.  Indication:  Nicotine Addiction   traZODone 100 MG tablet Commonly known as:  DESYREL Take 1 tablet (100 mg total) by mouth at bedtime and may repeat dose one time if needed.  Indication:  Trouble Sleeping      Follow-up Information    [facility] Follow up.   Specialty:  Behavioral Health Why:  Please go for a walk-in appointment to be established with the intensive outpatient program. Walk-in hours are Jory Sims, and Fridays 12PM-3PM. Contact information: [place] [address] [phone]        [facility] Follow up.   Why:  A referral for residential treatment has been made on your behalf. Please continue to call and check for bed availability. Thank you. Contact information: [place], [place] [address] P: [phone] F: [phone]          Follow-up recommendations:  Activity:  as tol Diet:  as tol  Comments:  1.  Take all your medications as prescribed.   2.  Report any adverse side effects to outpatient provider. 3.  Patient instructed to not use alcohol or illegal drugs while on prescription medicines. 4.  In the event of worsening symptoms, instructed patient to call 911, the crisis hotline or go to nearest emergency room for evaluation of symptoms.  Signed: Janett Labella, NP [facility], [place] 06/23/2016, 3:53 PM   Patient seen, Suicide Assessment Completed.  Disposition Plan Reviewed

## 2016-06-23 NOTE — Progress Notes (Signed)
Nursing Progress Note: 7p-7a D: Pt currently presents with a flat/pleasant affect and behavior. Pt states "I don't want to go to an inpatient facility. I just wanna go home after I discharge from here." Interacting well with milieu. Pt reports good sleep with current medication regimen.   A: Pt provided with medications per providers orders. Pt's labs and vitals were monitored throughout the night. Pt supported emotionally and encouraged to express concerns and questions. Pt educated on medications.  R: Pt's safety ensured with 15 minute and environmental checks. Pt currently denies SI/HI/Self Harm and AVH. Pt verbally contracts to seek staff if SI/HI or A/VH occurs and to consult with staff before acting on any harmful thoughts. Will continue to monitor.

## 2016-06-23 NOTE — Progress Notes (Signed)
  [facility] Adult Case Management Discharge Plan :  Will you be returning to the same living situation after discharge:  Yes,  pt returning to [place]. At discharge, do you have transportation home?: Yes,  bus pass provided. Do you have the ability to pay for your medications: Yes,  prescriptions and samples provided.  Release of information consent forms completed and in the chart;  Patient's signature needed at discharge.  Patient to Follow up at: Follow-up Information    [facility] Follow up.   Specialty:  Behavioral Health Why:  Please go for a walk-in appointment to be established with the intensive outpatient program. Walk-in hours are Jory Sims, and Fridays 12PM-3PM. Contact information: [place] [address] [phone]        [facility] Follow up.   Why:  A referral for residential treatment has been made on your behalf. Please continue to call and check for bed availability. Thank you. Contact information: [place], [place] [address] P: [phone] F: [phone]          Next level of care provider has access to [place] and Suicide Prevention discussed: Yes,  with pt.  Have you used any form of tobacco in the last 30 days? (Cigarettes, Smokeless Tobacco, Cigars, and/or Pipes): Yes  Has patient been referred to the Quitline?: Patient refused referral  Patient has been referred for addiction treatment: Yes  Georga Kaufmann 06/23/2016, 2:36 PM

## 2016-06-23 NOTE — Progress Notes (Signed)
Discharge note: Pt received both written and verbal discharge instructions. Pt verbalized understanding of discharge instructions. Pt agreed to f/u appt and med regimen. Pt received sample meds, prescriptions, AVS, SRA, bus pass and transitional record. Pt gathered belongings from room and locker. Pt safely discharged to the lobby.

## 2016-06-23 NOTE — Progress Notes (Signed)
Recreation Therapy Notes  Date: 06/23/16 Time: 0930 Location: 300 Hall Dayroom  Group Topic: Stress Management  Goal Area(s) Addresses:  Patient will verbalize importance of using healthy stress management.  Patient will identify positive emotions associated with healthy stress management.   Intervention: Stress Management  Activity :  Progressive Muscle Relaxation.  LRT introduced the stress management technique of progressive muscle relaxation.  LRT read a script to allow patients to tense and relax each muscle group individually.  Patients were to follow along as LRT read script to engage in activity.  Education:  Stress Management, Discharge Planning.   Education Outcome: Acknowledges edcuation/In group clarification offered/Needs additional education  Clinical Observations/Feedback: Pt did not attend group.   Victorino Sparrow, LRT/CTRS         Victorino Sparrow A 06/23/2016 12:21 PM

## 2016-07-05 ENCOUNTER — Encounter (HOSPITAL_COMMUNITY): Payer: Self-pay | Admitting: *Deleted

## 2016-07-05 ENCOUNTER — Emergency Department (HOSPITAL_COMMUNITY)
Admission: EM | Admit: 2016-07-05 | Discharge: 2016-07-06 | Disposition: A | Discharge status: Other Acute Inpatient Hospital | Payer: Self-pay | Attending: Emergency Medicine | Admitting: Emergency Medicine

## 2016-07-05 DIAGNOSIS — F17210 Nicotine dependence, cigarettes, uncomplicated: Secondary | ICD-10-CM | POA: Insufficient documentation

## 2016-07-05 DIAGNOSIS — I10 Essential (primary) hypertension: Secondary | ICD-10-CM | POA: Insufficient documentation

## 2016-07-05 DIAGNOSIS — F1020 Alcohol dependence, uncomplicated: Secondary | ICD-10-CM

## 2016-07-05 DIAGNOSIS — F1029 Alcohol dependence with unspecified alcohol-induced disorder: Secondary | ICD-10-CM | POA: Insufficient documentation

## 2016-07-05 DIAGNOSIS — F332 Major depressive disorder, recurrent severe without psychotic features: Secondary | ICD-10-CM | POA: Diagnosis present

## 2016-07-05 DIAGNOSIS — F1024 Alcohol dependence with alcohol-induced mood disorder: Secondary | ICD-10-CM | POA: Diagnosis present

## 2016-07-05 DIAGNOSIS — Z79899 Other long term (current) drug therapy: Secondary | ICD-10-CM | POA: Insufficient documentation

## 2016-07-05 LAB — CBC
HCT: 37.2 % — ABNORMAL LOW (ref 39.0–52.0)
Hemoglobin: 12.6 g/dL — ABNORMAL LOW (ref 13.0–17.0)
MCH: 29.9 pg (ref 26.0–34.0)
MCHC: 33.9 g/dL (ref 30.0–36.0)
MCV: 88.4 fL (ref 78.0–100.0)
PLATELETS: 213 10*3/uL (ref 150–400)
RBC: 4.21 MIL/uL — ABNORMAL LOW (ref 4.22–5.81)
RDW: 13.9 % (ref 11.5–15.5)
WBC: 13.5 10*3/uL — ABNORMAL HIGH (ref 4.0–10.5)

## 2016-07-05 LAB — COMPREHENSIVE METABOLIC PANEL
ALBUMIN: 3.9 g/dL (ref 3.5–5.0)
ALK PHOS: 170 U/L — AB (ref 38–126)
ALT: 38 U/L (ref 17–63)
AST: 71 U/L — AB (ref 15–41)
Anion gap: 10 (ref 5–15)
BILIRUBIN TOTAL: 0.2 mg/dL — AB (ref 0.3–1.2)
BUN: 10 mg/dL (ref 6–20)
CALCIUM: 8.4 mg/dL — AB (ref 8.9–10.3)
CO2: 24 mmol/L (ref 22–32)
CREATININE: 0.81 mg/dL (ref 0.61–1.24)
Chloride: 105 mmol/L (ref 101–111)
GFR calc Af Amer: >60 mL/min (ref >60)
GFR calc non Af Amer: >60 mL/min (ref >60)
GLUCOSE: 90 mg/dL (ref 65–99)
Potassium: 3.6 mmol/L (ref 3.5–5.1)
Sodium: 139 mmol/L (ref 135–145)
TOTAL PROTEIN: 7.5 g/dL (ref 6.5–8.1)

## 2016-07-05 LAB — RAPID URINE DRUG SCREEN, HOSP PERFORMED
Amphetamines: NOT DETECTED
Barbiturates: NOT DETECTED
Benzodiazepines: NOT DETECTED
Cocaine: NOT DETECTED
OPIATES: NOT DETECTED
TETRAHYDROCANNABINOL: NOT DETECTED

## 2016-07-05 LAB — ETHANOL: Alcohol, Ethyl (B): 368 mg/dL (ref <5)

## 2016-07-05 MED ORDER — AMLODIPINE BESYLATE 5 MG PO TABS
5.0000 mg | ORAL_TABLET | Freq: Every day | ORAL | Status: DC
  Administered 2016-07-06: 5 mg via ORAL
  Filled 2016-07-05: qty 1

## 2016-07-05 MED ORDER — LORAZEPAM 1 MG PO TABS
0.0000 mg | ORAL_TABLET | Freq: Four times a day (QID) | ORAL | Status: DC
  Administered 2016-07-06: 1 mg via ORAL
  Filled 2016-07-05: qty 1

## 2016-07-05 MED ORDER — LORAZEPAM 1 MG PO TABS: 0.0000 mg | ORAL_TABLET | Freq: Two times a day (BID) | ORAL | Status: DC

## 2016-07-05 NOTE — ED Notes (Signed)
ED Provider at bedside. Yao 

## 2016-07-05 NOTE — ED Notes (Signed)
Urine sample requested.

## 2016-07-05 NOTE — ED Notes (Signed)
Bed: WA29 Expected date:  Expected time:  Means of arrival:  Comments: 51 yr old ETOH/SI

## 2016-07-05 NOTE — ED Provider Notes (Signed)
[facility] DEPT Provider Note   CSN: [id number] Arrival date & time: 07/05/16  2113     History   Chief Complaint Chief Complaint  Patient presents with  . Depression    HPI Barry Moore is a 51 y.o. male history of alcohol abuse, alcohol-induced hepatitis, alcohol-induced mood disorder, hypertension, alcohol withdraw seizures here presenting with depression, alcohol intoxication. Patient lives out in the street and is currently homeless. Patient states that his friend died yesterday and he has been binging on alcohol. He has been drinking constantly and has thoughts of killing himself. He plans to drink himself to death or cut himself or overdose on other medications. He was recently admitted to [facility] for alcohol-induced mood disorder and hasn't been taking his meds.   The history is provided by the patient.    Past Medical History:  Diagnosis Date  . Alcoholic hepatitis   . Depression   . ETOH abuse   . Hypertension   . Seizures [facility])     Patient Active Problem List   Diagnosis Date Noted  . Alcohol-induced mood disorder ([place]) 06/15/2016  . Seizure ([place]) 05/26/2016  . Tobacco abuse 05/26/2016  . Homeless 05/26/2016  . Essential hypertension 05/26/2016  . MDD (major depressive disorder), recurrent severe, without psychosis ([place]) 03/06/2016  . Alcohol use disorder, severe, dependence ([place]) 02/14/2016  . Severe alcohol withdrawal without perceptual disturbances without complication ([place]) 68/02/7516  . Alcoholic hepatitis without ascites 02/14/2016  . Thrombocytopenia ([place]) 02/14/2016    Past Surgical History:  Procedure Laterality Date  . HERNIA REPAIR    . LEG SURGERY         Home Medications    Prior to Admission medications   Medication Sig Start Date End Date Taking? Authorizing Provider  acamprosate (CAMPRAL) 333 MG tablet Take 2 tablets (666 mg total) by mouth 3 (three) times daily with meals. 06/23/16   Kerrie Buffalo, NP  amLODipine (NORVASC) 5 MG  tablet Take 1 tablet (5 mg total) by mouth daily. 06/24/16   Kerrie Buffalo, NP  citalopram (CELEXA) 20 MG tablet Take 1 tablet (20 mg total) by mouth daily. 06/24/16   Kerrie Buffalo, NP  gabapentin (NEURONTIN) 100 MG capsule Take 2 capsules (200 mg total) by mouth 3 (three) times daily. 06/23/16   Kerrie Buffalo, NP  nicotine polacrilex (NICORETTE) 2 MG gum Take 1 each (2 mg total) by mouth as needed for smoking cessation. 06/23/16   Kerrie Buffalo, NP  traZODone (DESYREL) 100 MG tablet Take 1 tablet (100 mg total) by mouth at bedtime and may repeat dose one time if needed. 06/23/16   Kerrie Buffalo, NP    Family History Family History  Problem Relation Age of Onset  . Diabetes Mother   . Alcoholism Mother   . Emphysema Father   . Lung cancer Father   . Alcoholism Father     Social History Social History  Substance Use Topics  . Smoking status: Current Every Day Smoker    Packs/day: 2.00    Types: Cigarettes  . Smokeless tobacco: Never Used  . Alcohol use Yes     Comment: 5th liquor and one Forty daily       Allergies   Aspirin   Review of Systems Review of Systems  Psychiatric/Behavioral: Positive for depression, dysphoric mood and suicidal ideas.  All other systems reviewed and are negative.    Physical Exam Updated Vital Signs BP (!) 146/91   Pulse 85   Temp 98.7 F (37.1 C) (Oral)  Resp 18   SpO2 98%   Physical Exam  Constitutional:  Intoxicated   HENT:  Head: Normocephalic.  Mouth/Throat: Oropharynx is clear and moist.  Eyes: EOM are normal. Pupils are equal, round, and reactive to light.  Neck: Normal range of motion. Neck supple.  Cardiovascular: Normal rate, regular rhythm and normal heart sounds.   Pulmonary/Chest: Effort normal and breath sounds normal. No respiratory distress. He has no wheezes. He has no rales.  Abdominal: Soft. Bowel sounds are normal. He exhibits no distension. There is no tenderness. There is no guarding.  Musculoskeletal: Normal  range of motion.  Neurological: He is alert.  Intoxicated. Slightly sleepy. Nl strength throughout   Skin: Skin is warm.  Psychiatric:  Depressed, poor judgment   Nursing note and vitals reviewed.    ED Treatments / Results  Labs (all labs ordered are listed, but only abnormal results are displayed) Labs Reviewed  COMPREHENSIVE METABOLIC PANEL - Abnormal; Notable for the following:       Result Value   Calcium 8.4 (*)    AST 71 (*)    Alkaline Phosphatase 170 (*)    Total Bilirubin 0.2 (*)    All other components within normal limits  ETHANOL - Abnormal; Notable for the following:    Alcohol, Ethyl (B) 368 (*)    All other components within normal limits  CBC - Abnormal; Notable for the following:    WBC 13.5 (*)    RBC 4.21 (*)    Hemoglobin 12.6 (*)    HCT 37.2 (*)    All other components within normal limits  RAPID URINE DRUG SCREEN, HOSP PERFORMED    EKG  EKG Interpretation None       Radiology No results found.  Procedures Procedures (including critical care time)  Medications Ordered in ED Medications  LORazepam (ATIVAN) tablet 0-4 mg (not administered)    Followed by  LORazepam (ATIVAN) tablet 0-4 mg (not administered)  amLODipine (NORVASC) tablet 5 mg (not administered)     Initial Impression / Assessment and Plan / ED Course  I have reviewed the triage vital signs and the nursing notes.  Pertinent labs & imaging results that were available during my care of the patient were reviewed by me and considered in my medical decision making (see chart for details).     Barry Moore is a 51 y.o. male here with alcohol intoxication, depression. Appears intoxicated and very depressed with suicidal ideation and plan. His ETOH was 368. LFTs baseline. Will start on CIWA.   11:32 PM TTS tried to assess patient and patient appears intoxicated. They will see patient in AM when he is more sober. Medically cleared.    Final Clinical Impressions(s) / ED  Diagnoses   Final diagnoses:  None    New Prescriptions New Prescriptions   No medications on file     Drenda Freeze, MD 07/05/16 2332

## 2016-07-05 NOTE — ED Notes (Signed)
EMS sts patient has attention seeking behavior and is seen here often; EMS sts they smell alcohol on his breath; pt sts he "wants a gun to shoot himself"

## 2016-07-05 NOTE — ED Notes (Signed)
[phone]  Call TTS when patient is awake and more sober

## 2016-07-05 NOTE — BH Assessment (Signed)
[facility] Assessment Progress Note   Clinician spoke to nurse Estill Bamberg.  She said that patient is fast asleep.  He had a BAL of 368 at 21:35.  Clinician asked her to let us know when patient was alert and oriented.  Clinician also spoke with Dr. Darl Householder and requested the consult be taken out until patient is more alert and oriented.  He said that was fine as long as patient is on the list to be seen in AM.

## 2016-07-05 NOTE — ED Triage Notes (Signed)
Patient walked to a gas station and asked to use the phone to call 911; pt is homeless; patient sts he has rib pain and hes having suicidial thoughts; sts his depression is worse than usual

## 2016-07-06 ENCOUNTER — Inpatient Hospital Stay (HOSPITAL_COMMUNITY)
Admission: EM | Admit: 2016-07-06 | Discharge: 2016-07-12 | DRG: 897 | Disposition: A | Discharge status: Home / Self Care | Payer: No Typology Code available for payment source | Source: Intra-hospital | Attending: Psychiatry | Admitting: Psychiatry

## 2016-07-06 ENCOUNTER — Encounter (HOSPITAL_COMMUNITY): Payer: Self-pay

## 2016-07-06 DIAGNOSIS — Z811 Family history of alcohol abuse and dependence: Secondary | ICD-10-CM | POA: Diagnosis not present

## 2016-07-06 DIAGNOSIS — F17210 Nicotine dependence, cigarettes, uncomplicated: Secondary | ICD-10-CM | POA: Diagnosis present

## 2016-07-06 DIAGNOSIS — Z590 Homelessness: Secondary | ICD-10-CM

## 2016-07-06 DIAGNOSIS — F1024 Alcohol dependence with alcohol-induced mood disorder: Secondary | ICD-10-CM | POA: Diagnosis present

## 2016-07-06 DIAGNOSIS — I10 Essential (primary) hypertension: Secondary | ICD-10-CM | POA: Diagnosis present

## 2016-07-06 DIAGNOSIS — F332 Major depressive disorder, recurrent severe without psychotic features: Secondary | ICD-10-CM | POA: Diagnosis present

## 2016-07-06 DIAGNOSIS — Z825 Family history of asthma and other chronic lower respiratory diseases: Secondary | ICD-10-CM

## 2016-07-06 DIAGNOSIS — Z801 Family history of malignant neoplasm of trachea, bronchus and lung: Secondary | ICD-10-CM | POA: Diagnosis not present

## 2016-07-06 DIAGNOSIS — Z833 Family history of diabetes mellitus: Secondary | ICD-10-CM | POA: Diagnosis not present

## 2016-07-06 DIAGNOSIS — F10239 Alcohol dependence with withdrawal, unspecified: Secondary | ICD-10-CM | POA: Diagnosis present

## 2016-07-06 MED ORDER — LORAZEPAM 1 MG PO TABS: 0.0000 mg | ORAL_TABLET | Freq: Two times a day (BID) | ORAL | Status: DC

## 2016-07-06 MED ORDER — ONDANSETRON 4 MG PO TBDP: 4.0000 mg | ORAL_TABLET | Freq: Four times a day (QID) | ORAL | Status: AC | PRN

## 2016-07-06 MED ORDER — MAGNESIUM HYDROXIDE 400 MG/5ML PO SUSP: 30.0000 mL | Freq: Every day | ORAL | Status: DC | PRN

## 2016-07-06 MED ORDER — LORAZEPAM 1 MG PO TABS
1.0000 mg | ORAL_TABLET | Freq: Four times a day (QID) | ORAL | Status: AC
  Administered 2016-07-06 (×3): 1 mg via ORAL
  Filled 2016-07-06 (×3): qty 1

## 2016-07-06 MED ORDER — ADULT MULTIVITAMIN W/MINERALS CH
1.0000 | ORAL_TABLET | Freq: Every day | ORAL | Status: DC
  Administered 2016-07-06 – 2016-07-12 (×7): 1 via ORAL
  Filled 2016-07-06 (×10): qty 1

## 2016-07-06 MED ORDER — VITAMIN B-1 100 MG PO TABS
100.0000 mg | ORAL_TABLET | Freq: Every day | ORAL | Status: DC
  Administered 2016-07-07 – 2016-07-12 (×6): 100 mg via ORAL
  Filled 2016-07-06 (×8): qty 1

## 2016-07-06 MED ORDER — AMLODIPINE BESYLATE 5 MG PO TABS
5.0000 mg | ORAL_TABLET | Freq: Every day | ORAL | Status: DC
  Administered 2016-07-07 – 2016-07-12 (×6): 5 mg via ORAL
  Filled 2016-07-06: qty 1
  Filled 2016-07-06: qty 21
  Filled 2016-07-06 (×6): qty 1

## 2016-07-06 MED ORDER — THIAMINE HCL 100 MG/ML IJ SOLN
100.0000 mg | Freq: Once | INTRAMUSCULAR | Status: AC
  Administered 2016-07-06: 100 mg via INTRAMUSCULAR
  Filled 2016-07-06: qty 2

## 2016-07-06 MED ORDER — CITALOPRAM HYDROBROMIDE 10 MG PO TABS
10.0000 mg | ORAL_TABLET | Freq: Every day | ORAL | Status: DC
  Administered 2016-07-07 – 2016-07-09 (×3): 10 mg via ORAL
  Filled 2016-07-06 (×5): qty 1

## 2016-07-06 MED ORDER — GABAPENTIN 300 MG PO CAPS
300.0000 mg | ORAL_CAPSULE | Freq: Three times a day (TID) | ORAL | Status: DC
  Administered 2016-07-06: 300 mg via ORAL
  Filled 2016-07-06: qty 1

## 2016-07-06 MED ORDER — LOPERAMIDE HCL 2 MG PO CAPS: 2.0000 mg | ORAL_CAPSULE | ORAL | Status: AC | PRN

## 2016-07-06 MED ORDER — HYDROXYZINE HCL 25 MG PO TABS: 25.0000 mg | ORAL_TABLET | Freq: Four times a day (QID) | ORAL | Status: AC | PRN

## 2016-07-06 MED ORDER — GABAPENTIN 300 MG PO CAPS
300.0000 mg | ORAL_CAPSULE | Freq: Three times a day (TID) | ORAL | Status: DC
  Filled 2016-07-06 (×2): qty 1

## 2016-07-06 MED ORDER — LORAZEPAM 1 MG PO TABS
0.0000 mg | ORAL_TABLET | Freq: Four times a day (QID) | ORAL | Status: DC
  Administered 2016-07-06: 2 mg via ORAL

## 2016-07-06 MED ORDER — ACETAMINOPHEN 325 MG PO TABS
650.0000 mg | ORAL_TABLET | Freq: Four times a day (QID) | ORAL | Status: DC | PRN
  Administered 2016-07-10 (×2): 650 mg via ORAL
  Filled 2016-07-06 (×2): qty 2

## 2016-07-06 MED ORDER — LORAZEPAM 1 MG PO TABS
1.0000 mg | ORAL_TABLET | Freq: Three times a day (TID) | ORAL | Status: AC
  Administered 2016-07-07 (×3): 1 mg via ORAL
  Filled 2016-07-06 (×3): qty 1

## 2016-07-06 MED ORDER — GABAPENTIN 100 MG PO CAPS
100.0000 mg | ORAL_CAPSULE | Freq: Three times a day (TID) | ORAL | Status: DC
  Administered 2016-07-06 – 2016-07-09 (×9): 100 mg via ORAL
  Filled 2016-07-06 (×14): qty 1

## 2016-07-06 MED ORDER — LORAZEPAM 1 MG PO TABS
ORAL_TABLET | ORAL | Status: AC
  Administered 2016-07-06: 2 mg via ORAL
  Filled 2016-07-06: qty 2

## 2016-07-06 MED ORDER — LORAZEPAM 1 MG PO TABS
1.0000 mg | ORAL_TABLET | Freq: Two times a day (BID) | ORAL | Status: AC
  Administered 2016-07-08 (×2): 1 mg via ORAL
  Filled 2016-07-06 (×2): qty 1

## 2016-07-06 MED ORDER — LORAZEPAM 1 MG PO TABS
1.0000 mg | ORAL_TABLET | Freq: Four times a day (QID) | ORAL | Status: AC | PRN
  Administered 2016-07-06 – 2016-07-07 (×2): 1 mg via ORAL
  Filled 2016-07-06 (×2): qty 1

## 2016-07-06 MED ORDER — LORAZEPAM 1 MG PO TABS
1.0000 mg | ORAL_TABLET | Freq: Every day | ORAL | Status: AC
  Administered 2016-07-09: 1 mg via ORAL
  Filled 2016-07-06: qty 1

## 2016-07-06 NOTE — Progress Notes (Signed)
Nursing Progress Note: 7p-7a D: Pt currently presents with a sad/depressed/pained affect and behavior. Pt states "I'm detoxing so bad this time." Pt's most recent CIWA scored a 12. Not Interacting with milieu. Pt reports good sleep with current medication regimen.   A: Pt provided with medications per providers orders. Pt's labs and vitals were monitored throughout the night. Pt supported emotionally and encouraged to express concerns and questions. Pt educated on medications.  R: Pt's safety ensured with 15 minute and environmental checks. Pt currently denies SI/HI/Self Harm and AVH. Pt verbally contracts to seek staff if SI/HI or A/VH occurs and to consult with staff before acting on any harmful thoughts. Will continue to monitor.

## 2016-07-06 NOTE — ED Notes (Addendum)
Pt stated "I moved here from [place] about 8 months ago.  I followed a woman I met @ a homeless shelter in [place] down here.  I have kids but they don't want nothing to do with me.  They live in [place] too.  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  Being homeless is not a healthy lifestyle.  I live in a tent behind [place] on [address]."

## 2016-07-06 NOTE — Progress Notes (Signed)
Admission note:  Barry Moore was admitted to [facility] hall after reporting that he wanted to drink himself to death. His BAL in the ED was 368 yesterday at 21:35. His CIWA on admission was 14. He was so tremulous it affected his ability to hold a pen, he complained of a headache 8/10, and he was visibly sweaty. He denied SI, HI, and AVH at admission, though he admitted he had felt SI without a plan yesterday and had felt HI yesterday to a guy named [place], whom he said gave drugs to a friend who died recently.   Barry Moore's last drink was right before the rescue squad showed up, per pt. He says he has had a seizure this week. He reports a hx of seizures that are related to alcohol withdrawal as well as ones that aren't withdrawal-related. He said he can usually tell when one is coming on.   No contraband was found during his skin assessment. He had tattoos on both forearms, a scar on his right forearms, and dry/cracked feet. He had a scab on his left knee as well as a lump of unknown origin on his lower right leg that he said has been there for years.   Pt smokes 1 1/2PPD but declined the patch or gum. He said his longest time of sobriety has been 3 1/2 - 4 years while he was in prison. He said he lives in a tent behind [place] and has no source of support. He was at [facility] in December and March.   Pt was oriented to unit, medicated, and is now resting with eyes closed. Fifteen-minute checks are in place. Will continue to monitor for needs/safety.

## 2016-07-06 NOTE — BH Assessment (Signed)
[facility] Assessment Progress Note  Per Patriciaann Clan, PA, this pt requires psychiatric hospitalization at this time.  [name], RN, [facility] has assigned pt to [facility] Rm 401-2; they are now ready to receive pt.  Pt has signed Voluntary Admission and Consent for Treatment, as well as Consent to Release Information to no one, and signed forms have been faxed to [facility].  Pt's nurse has been notified, and agrees to send original paperwork along with pt via [place], and to call report to [phone].  Jalene Mullet, [place] Triage Specialist [phone]

## 2016-07-06 NOTE — BHH Suicide Risk Assessment (Signed)
[facility] INPATIENT:  Family/Significant Other Suicide Prevention Education  Suicide Prevention Education:  Patient Refusal for Family/Significant Other Suicide Prevention Education: The patient Barry Moore has refused to provide written consent for family/significant other to be provided Family/Significant Other Suicide Prevention Education during admission and/or prior to discharge.  Physician notified.  SPE completed with pt, as pt refused to consent to family contact. SPI pamphlet provided to pt and pt was encouraged to share information with support network, ask questions, and talk about any concerns relating to SPE. Pt denies access to guns/firearms and verbalized understanding of information provided. Mobile Crisis information also provided to pt.   [name] N Smart LCSW 07/06/2016, 3:13 PM

## 2016-07-06 NOTE — Tx Team (Addendum)
Initial Treatment Plan 07/06/2016 1:09 PM Barry Moore VBT:[id number]    PATIENT STRESSORS: Financial difficulties Substance abuse   PATIENT STRENGTHS: Ability for insight Agricultural engineer for treatment/growth   PATIENT IDENTIFIED PROBLEMS: "Learn how to deal with grief"  "Learn how to not drink"  "suicidal"                 DISCHARGE CRITERIA:  Improved stabilization in mood, thinking, and/or behavior  PRELIMINARY DISCHARGE PLAN: Attend 12-step recovery group Outpatient therapy  PATIENT/FAMILY INVOLVEMENT: This treatment plan has been presented to and reviewed with the patient, Barry Moore.  The patient and family have been given the opportunity to ask questions and make suggestions.  Marya Landry, RN 07/06/2016, 1:09 PM

## 2016-07-06 NOTE — BH Assessment (Signed)
Tele Assessment Note   Barry Moore is an 51 y.o. male.  -Clinician reviewed note from Dr. Darl Householder.  Patient was too inebriated and sleepy to be assessed last night.  Patient was assessed this morning when he was alert and oriented.    Patient went to a store and asked to use the phone.  He called EMS to let them know he was suicidal.  Patient was transported to [facility].  Patient told Dr. Darl Householder that he planned to "drink himself to death."  His BAL was 368 at 21:35.    Patient still is endorsing SI.  He says "there's lots of ways" when asked how he would carry out suicide.  Patient says he has tried to kill himself twice before, once by overdose.  Pt cannot contract for safety.    Patient is depressed about the death of a friend of his earlier this week (either Sunday or yesterday).  Patient thinks that his friend was hanging around another guy that was giving him drugs which may have contributed to his death.  Patient would like to harm the person that may have given his friend drugs.  No real plan or intention however.   Patient denies any A/V hallucinations.  Patient drinks about half a gallon of liquor over the course of two days on average.  Last night he said he finished off a half gallon then drank about six 40's.  Patient says he has a history of ETOH withdrawal seizures.  Patient says his last one was "the other day."  Patient denies the use of any other illicit drugs.  Pt is currently homeless.  He lives in a tent behind [place] on [address]  Patient has children but they have nothing to do with him.  Pt has no outpatient care.  He has been to [facility] in Dec 2017 and 06/15/16.  -Clinician reviewed patient with Patriciaann Clan, PA who recommends inpatient care.  Patient accepted to [facility] 401-2 by Dr. Parke Poisson.  Daytme AC to make decision on when patient to come to [facility].  Diagnosis: MDD, recurrent severe; ETOH use d/o severe  Past Medical History:  Past Medical History:  Diagnosis Date  . Alcoholic  hepatitis   . Depression   . ETOH abuse   . Hypertension   . Seizures ([place])     Past Surgical History:  Procedure Laterality Date  . HERNIA REPAIR    . LEG SURGERY      Family History:  Family History  Problem Relation Age of Onset  . Diabetes Mother   . Alcoholism Mother   . Emphysema Father   . Lung cancer Father   . Alcoholism Father     Social History:  reports that he has been smoking Cigarettes.  He has been smoking about 2.00 packs per day. He has never used smokeless tobacco. He reports that he drinks alcohol. He reports that he does not use drugs.  Additional Social History:  Alcohol / Drug Use Pain Medications: None Prescriptions: None Over the Counter: None History of alcohol / drug use?: Yes Withdrawal Symptoms: Seizures, Sweats, Nausea / Vomiting, Fever / Chills, Tremors, Diarrhea, Patient aware of relationship between substance abuse and physical/medical complications Onset of Seizures: Detox Date of most recent seizure: "the other day" Substance #1 Name of Substance 1: ETOH ( usually liquor) 1 - Age of First Use: 51 years of age 79 - Amount (size/oz): "A half gallon will last me two days." 1 - Frequency: Daily 1 -  Duration: Last two months at this rate 1 - Last Use / Amount: 04/16 "Finished a half gallon and six 40's"  CIWA: CIWA-Ar BP: (!) 142/91 Pulse Rate: 93 Nausea and Vomiting: no nausea and no vomiting Tactile Disturbances: none Tremor: severe, even with arms not extended Auditory Disturbances: not present Paroxysmal Sweats: no sweat visible Visual Disturbances: not present Anxiety: no anxiety, at ease Headache, Fullness in Head: none present Agitation: normal activity Orientation and Clouding of Sensorium: oriented and can do serial additions CIWA-Ar Total: 7 COWS:    PATIENT STRENGTHS: (choose at least two) Ability for insight Average or above average intelligence Capable of independent living Communication skills  Allergies:   Allergies  Allergen Reactions  . Aspirin Nausea And Vomiting    Home Medications:  (Not in a hospital admission)  OB/GYN Status:  No LMP for male patient.  General Assessment Data Location of Assessment: [facility] ED TTS Assessment: In system Is this a Tele or Face-to-Face Assessment?: Tele Assessment Is this an Initial Assessment or a Re-assessment for this encounter?: Initial Assessment Marital status: Single Is patient pregnant?: No Pregnancy Status: No Living Arrangements: Other (Comment) (Pt is homeless) Can pt return to current living arrangement?: Yes Admission Status: Voluntary Is patient capable of signing voluntary admission?: Yes Referral Source: Self/Family/Friend (Pt called EMS from a store.) Insurance type: self pay     Crisis Care Plan Living Arrangements: Other (Comment) (Pt is homeless) Name of Psychiatrist: None Name of Therapist: None  Education Status Is patient currently in school?: No Highest grade of school patient has completed: GED  Risk to self with the past 6 months Suicidal Ideation: Yes-Currently Present Has patient been a risk to self within the past 6 months prior to admission? : Yes Suicidal Intent: Yes-Currently Present Has patient had any suicidal intent within the past 6 months prior to admission? : Yes Is patient at risk for suicide?: Yes Suicidal Plan?: Yes-Currently Present Has patient had any suicidal plan within the past 6 months prior to admission? : Yes Specify Current Suicidal Plan: Drink himself to death Access to Means: Yes Specify Access to Suicidal Means: ETOH What has been your use of drugs/alcohol within the last 12 months?: ETOH Previous Attempts/Gestures: Yes How many times?: 2 Other Self Harm Risks: None Triggers for Past Attempts: Unpredictable Intentional Self Injurious Behavior: None Family Suicide History: No Recent stressful life event(s): Loss (Comment), Other (Comment) (Friend died earlier in week;  homelessness) Persecutory voices/beliefs?: Yes Depression: Yes Depression Symptoms: Despondent, Guilt, Loss of interest in usual pleasures, Feeling worthless/self pity, Isolating, Insomnia Substance abuse history and/or treatment for substance abuse?: Yes Suicide prevention information given to non-admitted patients: Not applicable  Risk to Others within the past 6 months Homicidal Ideation: No Does patient have any lifetime risk of violence toward others beyond the six months prior to admission? : No Thoughts of Harm to Others: Yes-Currently Present Comment - Thoughts of Harm to Others: Yes, harm the person that may have contributed to friend's death Current Homicidal Intent: No Current Homicidal Plan: No Access to Homicidal Means: No Identified Victim: Unknown History of harm to others?: No Assessment of Violence: None Noted Violent Behavior Description: Arguments but no fithts Does patient have access to weapons?: No Criminal Charges Pending?: Yes Describe Pending Criminal Charges: In [place] but not in [place] Does patient have a court date: No Is patient on probation?: No  Psychosis Hallucinations: None noted Delusions: None noted  Mental Status Report Appearance/Hygiene: Disheveled, In scrubs Eye Contact: Good Motor  Activity: Freedom of movement, Unremarkable Speech: Logical/coherent Level of Consciousness: Alert Mood: Depressed, Despair, Helpless, Sad Affect: Depressed, Sad Anxiety Level: Minimal Thought Processes: Coherent, Relevant Judgement: Impaired Orientation: Person, Place, Situation Obsessive Compulsive Thoughts/Behaviors: None  Cognitive Functioning Concentration: Decreased Memory: Recent Impaired, Remote Intact IQ: Average Insight: Poor Impulse Control: Poor Appetite: Fair Weight Loss:  (Drinking more than eating.) Weight Gain: 0 Sleep: Decreased Total Hours of Sleep:  (<4H/D) Vegetative Symptoms: Not bathing  ADLScreening [facility] Assessment  Services) Patient's cognitive ability adequate to safely complete daily activities?: Yes Patient able to express need for assistance with ADLs?: Yes Independently performs ADLs?: Yes (appropriate for developmental age)  Prior Inpatient Therapy Prior Inpatient Therapy: Yes Prior Therapy Dates: Dec 2017; 05/2016 Prior Therapy Facilty/Provider(s): [facility] Reason for Treatment: SI  Prior Outpatient Therapy Prior Outpatient Therapy: No Prior Therapy Dates: N/A Prior Therapy Facilty/Provider(s): N/A Reason for Treatment: N/A Does patient have an ACCT team?: No Does patient have Intensive In-House Services?  : No Does patient have [facility] services? : No Does patient have P4CC services?: No  ADL Screening (condition at time of admission) Patient's cognitive ability adequate to safely complete daily activities?: Yes Is the patient deaf or have difficulty hearing?: No Does the patient have difficulty seeing, even when wearing glasses/contacts?: No Does the patient have difficulty concentrating, remembering, or making decisions?: Yes Patient able to express need for assistance with ADLs?: Yes Does the patient have difficulty dressing or bathing?: No Independently performs ADLs?: Yes (appropriate for developmental age) Does the patient have difficulty walking or climbing stairs?: No Weakness of Legs: None Weakness of Arms/Hands: None       Abuse/Neglect Assessment (Assessment to be complete while patient is alone) Physical Abuse: Denies Verbal Abuse: Yes, past (Comment) (Past emotional abuse.) Sexual Abuse: Denies Exploitation of patient/patient's resources: Denies Self-Neglect: Denies     Regulatory affairs officer (For Healthcare) Does Patient Have a Medical Advance Directive?: No Would patient like information on creating a medical advance directive?: No - Patient declined    Additional Information 1:1 In Past 12 Months?: No CIRT Risk: No Elopement Risk: No Does patient have medical  clearance?: Yes     Disposition:  Disposition Initial Assessment Completed for this Encounter: Yes Disposition of Patient: Other dispositions Other disposition(s): Other (Comment) (Pt to be reviewed by PA)  Raymondo Band 07/06/2016 6:45 AM

## 2016-07-06 NOTE — [facility] Suicide Risk Assessment (Signed)
[facility] Admission Suicide Risk Assessment   Nursing information obtained from:    patient and chart  Demographic factors:   52 year old male, currently homeless  Current Mental Status:   see below Loss Factors:   homelessness  Historical Factors:   alcohol dependence Risk Reduction Factors:   resilience   Total Time spent with patient: 45 minutes Principal Problem: Major depressive disorder, recurrent severe without psychotic features ([place]) Diagnosis:   Patient Active Problem List   Diagnosis Date Noted  . Major depressive disorder, recurrent severe without psychotic features ([place]) [F33.2] 07/06/2016  . Alcohol-induced mood disorder ([place]) [F10.94] 06/15/2016  . Seizure ([place]) [R56.9] 05/26/2016  . Tobacco abuse [Z72.0] 05/26/2016  . Homeless [Z59.0] 05/26/2016  . Essential hypertension [I10] 05/26/2016  . MDD (major depressive disorder), recurrent severe, without psychosis ([place]) [F33.2] 03/06/2016  . Alcohol use disorder, severe, dependence ([place]) [F10.20] 02/14/2016  . Severe alcohol withdrawal without perceptual disturbances without complication ([place]) [K35.465] 02/14/2016  . Alcoholic hepatitis without ascites [K70.10] 02/14/2016  . Thrombocytopenia ([place]) [D69.6] 02/14/2016    Continued Clinical Symptoms:  Alcohol Use Disorder Identification Test Final Score (AUDIT): 36 The "Alcohol Use Disorders Identification Test", Guidelines for Use in Primary Care, Second Edition.  World Pharmacologist [facility]). Score between 0-7:  no or low risk or alcohol related problems. Score between 8-15:  moderate risk of alcohol related problems. Score between 16-19:  high risk of alcohol related problems. Score 20 or above:  warrants further diagnostic evaluation for alcohol dependence and treatment.   CLINICAL FACTORS:  Patient is a 51 year old male, currently homeless. He is well known to our unit from recent psychiatric admission, from 3/27 through 06/23/16. At the time was admitted due to heavy,  daily alcohol consumption, with an admission BAL of 436, and depression. He was discharged on Celexa , Campral, Neurontin. He has a history of severe alcohol dependence. States that 2-3 days  after his recent discharge from the inpatient unit  he relapsed on ETOH . Since then he has been drinking daily and heavily. His admission BAL was 368. Admission UDS negative . Of note, patient states he has not been taking prescribed medications over recent days .  He presented to the ED reporting worsening depression , suicidal ideations. At this time presents with symptoms of alcohol WDL - presents flushed, tremulous.  Dx- Alcohol Dependence, Alcohol WDL , Alcohol Induced Mood Disorder, versus MDD  Plan - Ativan Detox Protocol to minimize risk of alcohol WDL symptoms , continue Neurontin at 100 mgrs TID, restart Celexa at 10 mgrs QDAY initially    Musculoskeletal: Strength & Muscle Tone: tremulous, vaguely restless  Gait & Station: in bed , gait not examined at this time Patient leans: N/A  Psychiatric Specialty Exam: Physical Exam  ROS (+) headache, no chest pain, no shortness of breath, no vomiting   Blood pressure (!) 150/94, pulse 97, temperature 98 F (36.7 C), temperature source Oral, resp. rate 16, height 5\' 8"  (1.727 m), weight 81.6 kg (180 lb), SpO2 100 %.Body mass index is 27.37 kg/m.  General Appearance: Disheveled  Eye Contact:  Fair  Speech:  Normal Rate  Volume:  Decreased  Mood:  depressed, anxious   Affect:  constricted, anxious   Thought Process:  Linear and Descriptions of Associations: Intact  Orientation:  Full (Time, Place, and Person)- no evidence of delirium at this time  Thought Content:  denies hallucinations, no delusions expressed   Suicidal Thoughts:  No at this time denies  suicidal plan or intention and contracts for safety on unit, also denies any homicidal or violent ideations  Homicidal Thoughts:  No  Memory:  recent and remote grossly intact   Judgement:  Fair   Insight:  Fair  Psychomotor Activity:  tremulous  Concentration:  Concentration: Fair and Attention Span: Fair  Recall:  [place] of Knowledge:  Good  Language:  Good  Akathisia:  Negative  Handed:  Right  AIMS (if indicated):     Assets:  Desire for Improvement Resilience  ADL's:  Impaired  Cognition:  WNL  Sleep:         COGNITIVE FEATURES THAT CONTRIBUTE TO RISK:  Closed-mindedness and Loss of executive function    SUICIDE RISK:   Moderate:  Frequent suicidal ideation with limited intensity, and duration, some specificity in terms of plans, no associated intent, good self-control, limited dysphoria/symptomatology, some risk factors present, and identifiable protective factors, including available and accessible social support.  PLAN OF CARE: Admit patient to inpatient unit for the purpose of detoxification/ management of withdrawal. Provide support and encouragement regarding sobriety/abstinence efforts. Will follow daily.   I certify that inpatient services furnished can reasonably be expected to improve the patient's condition.   Jenne Campus, MD 07/06/2016, 2:12 PM

## 2016-07-06 NOTE — Progress Notes (Signed)
Recreation Therapy Notes  Animal-Assisted Activity (AAA) Program Checklist/Progress Notes Patient Eligibility Criteria Checklist & Daily Group note for Rec TxIntervention  Date: 04.17.2018 Time: 2:45pm Location: [address]   AAA/T Program Assumption of Risk Form signed by Patient/ or Parent Legal Guardian Yes  Patient is free of allergies or sever asthma Yes  Patient reports no fear of animals Yes  Patient reports no history of cruelty to animals Yes  Patient understands his/her participation is voluntary Yes  Behavioral Response: Did not attend.   Laureen Ochs Aluna Whiston, LRT/CTRS        Izora Benn L 07/06/2016 3:09 PM

## 2016-07-06 NOTE — Plan of Care (Signed)
Problem: Safety: Goal: Ability to remain free from injury will improve Outcome: Progressing Pt remains free from harm with q14min checks in place. Pt medicated per orders.

## 2016-07-06 NOTE — ED Notes (Signed)
Report given to Santiago Glad, Therapist, sports at [facility].    Contacted [facility] and they are behind.  Will comes ASAP.

## 2016-07-06 NOTE — H&P (Signed)
Psychiatric Admission Assessment Adult  Patient Identification: Barry Moore MRN:  [id number] Date of Evaluation:  07/06/2016 Chief Complaint:  Etoh use disorder severe MDD,rec,sev Principal Diagnosis: Major depressive disorder, recurrent severe without psychotic features ([place]) Diagnosis:   Patient Active Problem List   Diagnosis Date Noted  . Major depressive disorder, recurrent severe without psychotic features ([place]) [F33.2] 07/06/2016    Priority: High  . Alcohol-induced mood disorder ([place]) [F10.94] 06/15/2016  . Seizure ([place]) [R56.9] 05/26/2016  . Tobacco abuse [Z72.0] 05/26/2016  . Homeless [Z59.0] 05/26/2016  . Essential hypertension [I10] 05/26/2016  . MDD (major depressive disorder), recurrent severe, without psychosis ([place]) [F33.2] 03/06/2016  . Alcohol use disorder, severe, dependence ([place]) [F10.20] 02/14/2016  . Severe alcohol withdrawal without perceptual disturbances without complication ([place]) [L07.867] 02/14/2016  . Alcoholic hepatitis without ascites [K70.10] 02/14/2016  . Thrombocytopenia ([place]) [D69.6] 02/14/2016   History of Present Illness:  Barry Moore, 51 yo male patient, homeless, single, presented to ED intoxicated, stating that he "wanted to drink himself to death."  His BAL 368.  He was recently discharged from [facility] with date on 06/24/15.  He states that he didn't do well after he left, his best friend was killed by someone who gave him drugs.  He felt homicidal towards this person.  Patient was alert upon assessment, however, diaphoretic and tremulous.    He states that he drinks daily.  He states he wishes to learn better coping skills to deal with grief and learn not to drink.  Associated Signs/Symptoms: Depression Symptoms:  depressed mood, difficulty concentrating, hopelessness, (Hypo) Manic Symptoms:  Labiality of Mood, Anxiety Symptoms:  Excessive Worry, Psychotic Symptoms:  NA PTSD Symptoms: NA Total Time spent with patient: 30 minutes  Past  Psychiatric History:   See HPI Is the patient at risk to self? Yes.    Has the patient been a risk to self in the past 6 months? Yes.    Has the patient been a risk to self within the distant past? Yes.    Is the patient a risk to others? No.  Has the patient been a risk to others in the past 6 months? No.  Has the patient been a risk to others within the distant past? No.   Prior Inpatient Therapy:   Prior Outpatient Therapy:    Alcohol Screening: 1. How often do you have a drink containing alcohol?: 4 or more times a week 2. How many drinks containing alcohol do you have on a typical day when you are drinking?: 10 or more 3. How often do you have six or more drinks on one occasion?: Daily or almost daily Preliminary Score: 8 4. How often during the last year have you found that you were not able to stop drinking once you had started?: Daily or almost daily 5. How often during the last year have you failed to do what was normally expected from you becasue of drinking?: Daily or almost daily 6. How often during the last year have you needed a first drink in the morning to get yourself going after a heavy drinking session?: Daily or almost daily 7. How often during the last year have you had a feeling of guilt of remorse after drinking?: Daily or almost daily 8. How often during the last year have you been unable to remember what happened the night before because you had been drinking?: Daily or almost daily 9. Have you or someone else been injured as a result of your drinking?: No 10.  Has a relative or friend or a doctor or another health worker been concerned about your drinking or suggested you cut down?: Yes, during the last year Alcohol Use Disorder Identification Test Final Score (AUDIT): 36 Brief Intervention: Yes Substance Abuse History in the last 12 months:  Yes.   Consequences of Substance Abuse: inpatient admission for crisis managment Previous Psychotropic Medications: Yes   Psychological Evaluations: No  Past Medical History:  Past Medical History:  Diagnosis Date  . Alcoholic hepatitis   . Depression   . ETOH abuse   . Hypertension   . Seizures ([place])     Past Surgical History:  Procedure Laterality Date  . HERNIA REPAIR    . LEG SURGERY     Family History:  Family History  Problem Relation Age of Onset  . Diabetes Mother   . Alcoholism Mother   . Emphysema Father   . Lung cancer Father   . Alcoholism Father    Family Psychiatric  History: see HPI Tobacco Screening: Have you used any form of tobacco in the last 30 days? (Cigarettes, Smokeless Tobacco, Cigars, and/or Pipes): Yes Tobacco use, Select all that apply: 5 or more cigarettes per day Are you interested in Tobacco Cessation Medications?: No, patient refused Counseled patient on smoking cessation including recognizing danger situations, developing coping skills and basic information about quitting provided: Refused/Declined practical counseling Social History:  History  Alcohol Use  . Yes    Comment: 5th liquor and one Forty daily       History  Drug Use No    Additional Social History:                           Allergies:   Allergies  Allergen Reactions  . Aspirin Nausea And Vomiting   Lab Results:  Results for orders placed or performed during the hospital encounter of 07/05/16 (from the past 48 hour(s))  Comprehensive metabolic panel     Status: Abnormal   Collection Time: 07/05/16  9:35 PM  Result Value Ref Range   Sodium 139 135 - 145 mmol/L   Potassium 3.6 3.5 - 5.1 mmol/L   Chloride 105 101 - 111 mmol/L   CO2 24 22 - 32 mmol/L   Glucose, Bld 90 65 - 99 mg/dL   BUN 10 6 - 20 mg/dL   Creatinine, Ser 0.81 0.61 - 1.24 mg/dL   Calcium 8.4 (L) 8.9 - 10.3 mg/dL   Total Protein 7.5 6.5 - 8.1 g/dL   Albumin 3.9 3.5 - 5.0 g/dL   AST 71 (H) 15 - 41 U/L   ALT 38 17 - 63 U/L   Alkaline Phosphatase 170 (H) 38 - 126 U/L   Total Bilirubin 0.2 (L) 0.3 - 1.2 mg/dL    GFR calc non Af Amer >60 >60 mL/min   GFR calc Af Amer >60 >60 mL/min    Comment: (NOTE) The eGFR has been calculated using the CKD EPI equation. This calculation has not been validated in all clinical situations. eGFR's persistently <60 mL/min signify possible Chronic Kidney Disease.    Anion gap 10 5 - 15  Ethanol     Status: Abnormal   Collection Time: 07/05/16  9:35 PM  Result Value Ref Range   Alcohol, Ethyl (B) 368 (HH) <5 mg/dL    Comment:        LOWEST DETECTABLE LIMIT FOR SERUM ALCOHOL IS 5 mg/dL FOR MEDICAL PURPOSES ONLY CRITICAL RESULT CALLED TO, READ  BACK BY AND VERIFIED WITH: LAWSON,A RN 4.16.18 _0  ZANDO,C   cbc     Status: Abnormal   Collection Time: 07/05/16  9:35 PM  Result Value Ref Range   WBC 13.5 (H) 4.0 - 10.5 K/uL   RBC 4.21 (L) 4.22 - 5.81 MIL/uL   Hemoglobin 12.6 (L) 13.0 - 17.0 g/dL   HCT 37.2 (L) 39.0 - 52.0 %   MCV 88.4 78.0 - 100.0 fL   MCH 29.9 26.0 - 34.0 pg   MCHC 33.9 30.0 - 36.0 g/dL   RDW 13.9 11.5 - 15.5 %   Platelets 213 150 - 400 K/uL  Rapid urine drug screen (hospital performed)     Status: None   Collection Time: 07/05/16  9:40 PM  Result Value Ref Range   Opiates NONE DETECTED NONE DETECTED   Cocaine NONE DETECTED NONE DETECTED   Benzodiazepines NONE DETECTED NONE DETECTED   Amphetamines NONE DETECTED NONE DETECTED   Tetrahydrocannabinol NONE DETECTED NONE DETECTED   Barbiturates NONE DETECTED NONE DETECTED    Comment:        DRUG SCREEN FOR MEDICAL PURPOSES ONLY.  IF CONFIRMATION IS NEEDED FOR ANY PURPOSE, NOTIFY LAB WITHIN 5 DAYS.        LOWEST DETECTABLE LIMITS FOR URINE DRUG SCREEN Drug Class       Cutoff (ng/mL) Amphetamine      1000 Barbiturate      200 Benzodiazepine   326 Tricyclics       712 Opiates          300 Cocaine          300 THC              50     Blood Alcohol level:  Lab Results  Component Value Date   ETH 368 (HH) 07/05/2016   ETH 114 (H) 45/80/9983    Metabolic Disorder Labs:  Lab  Results  Component Value Date   HGBA1C 5.2 03/07/2016   MPG 103 03/07/2016   Lab Results  Component Value Date   PROLACTIN 23.2 (H) 03/07/2016   Lab Results  Component Value Date   CHOL 215 (H) 03/07/2016   TRIG 82 03/07/2016   HDL 105 03/07/2016   CHOLHDL 2.0 03/07/2016   VLDL 16 03/07/2016   LDLCALC 94 03/07/2016    Current Medications: Current Facility-Administered Medications  Medication Dose Route Frequency Provider Last Rate Last Dose  . acetaminophen (TYLENOL) tablet 650 mg  650 mg Oral Q6H PRN Patrecia Pour, NP      . Derrill Memo ON 07/07/2016] amLODipine (NORVASC) tablet 5 mg  5 mg Oral Daily Patrecia Pour, NP      . gabapentin (NEURONTIN) capsule 300 mg  300 mg Oral TID Patrecia Pour, NP      . LORazepam (ATIVAN) tablet 0-4 mg  0-4 mg Oral Q6H Patrecia Pour, NP   2 mg at 07/06/16 1209   Followed by  . [START ON 07/08/2016] LORazepam (ATIVAN) tablet 0-4 mg  0-4 mg Oral Q12H Patrecia Pour, NP      . magnesium hydroxide (MILK OF MAGNESIA) suspension 30 mL  30 mL Oral Daily PRN Patrecia Pour, NP       PTA Medications: Prescriptions Prior to Admission  Medication Sig Dispense Refill Last Dose  . acamprosate (CAMPRAL) 333 MG tablet Take 2 tablets (666 mg total) by mouth 3 (three) times daily with meals. (Patient not taking: Reported on 07/06/2016) 180 tablet 0 Not Taking at  Unknown time  . citalopram (CELEXA) 20 MG tablet Take 1 tablet (20 mg total) by mouth daily. (Patient not taking: Reported on 07/06/2016) 30 tablet 0 Not Taking at Unknown time  . gabapentin (NEURONTIN) 100 MG capsule Take 2 capsules (200 mg total) by mouth 3 (three) times daily. (Patient not taking: Reported on 07/06/2016) 180 capsule 0 Not Taking at Unknown time  . nicotine polacrilex (NICORETTE) 2 MG gum Take 1 each (2 mg total) by mouth as needed for smoking cessation. (Patient not taking: Reported on 07/06/2016) 100 tablet 0 Not Taking at Unknown time  . traZODone (DESYREL) 100 MG tablet Take 1 tablet (100  mg total) by mouth at bedtime and may repeat dose one time if needed. (Patient not taking: Reported on 07/06/2016) 30 tablet 0 Not Taking at Unknown time   Musculoskeletal: Strength & Muscle Tone: within normal limits Gait & Station: normal Patient leans: N/A  Psychiatric Specialty Exam: Physical Exam  Nursing note and vitals reviewed.   ROS  Blood pressure (!) 150/94, pulse 97, temperature 98 F (36.7 C), temperature source Oral, resp. rate 16, height '5\' 8"'$  (1.727 m), weight 81.6 kg (180 lb), SpO2 100 %.Body mass index is 27.37 kg/m.  General Appearance: Disheveled  Eye Contact:  Minimal  Speech:  Normal Rate  Volume:  Normal  Mood:  Anxious, Depressed and Hopeless  Affect:  Constricted  Thought Process:  NA  Orientation:  Full (Time, Place, and Person)  Thought Content:  Logical  Suicidal Thoughts:  No  Homicidal Thoughts:  No  Memory:  Immediate;   Fair Recent;   Fair Remote;   Fair  Judgement:  Fair  Insight:  Fair  Psychomotor Activity:  Normal  Concentration:  Concentration: Fair and Attention Span: Fair  Recall:  [employer] of Knowledge:  Fair  Language:  Fair  Akathisia:  No  Handed:  Right  AIMS (if indicated):     Assets:  Desire for Improvement Social Support  ADL's:  Intact  Cognition:  WNL  Sleep:  poor   Treatment Plan Summary: Admit for crisis management and mood stabilization. Medication management to re-stabilize current mood symptoms Group counseling sessions for coping skills Medical consults as needed Review and reinstate any pertinent home medications for other health problems  Observation Level/Precautions:  15 minute checks  Laboratory:  per ED  Psychotherapy:  group  Medications:  As per medlist  Consultations:  As needed  Discharge Concerns:  safety  Estimated LOS:  2-7 days  Other:     Physician Treatment Plan for Primary Diagnosis: Major depressive disorder, recurrent severe without psychotic features ([place]) Long Term Goal(s):  Improvement in symptoms so as ready for discharge  Short Term Goals: Ability to identify changes in lifestyle to reduce recurrence of condition will improve, Ability to verbalize feelings will improve, Ability to disclose and discuss suicidal ideas, Ability to demonstrate self-control will improve, Ability to identify and develop effective coping behaviors will improve, Ability to maintain clinical measurements within normal limits will improve, Compliance with prescribed medications will improve and Ability to identify triggers associated with substance abuse/mental health issues will improve  Physician Treatment Plan for Secondary Diagnosis: Principal Problem:   Major depressive disorder, recurrent severe without psychotic features ([place])  Long Term Goal(s): Improvement in symptoms so as ready for discharge  Short Term Goals: Ability to identify changes in lifestyle to reduce recurrence of condition will improve, Ability to verbalize feelings will improve, Ability to disclose and discuss suicidal ideas, Ability  to demonstrate self-control will improve, Ability to identify and develop effective coping behaviors will improve, Ability to maintain clinical measurements within normal limits will improve, Compliance with prescribed medications will improve and Ability to identify triggers associated with substance abuse/mental health issues will improve  I certify that inpatient services furnished can reasonably be expected to improve the patient's condition.    Janett Labella, NP [facility] 4/17/20181:18 PM  I have discussed case with NP and have met with patient Agree with NP note and assessment  Patient is a 52 year old male, currently homeless. He is well known to our unit from recent psychiatric admission, from 3/27 through 06/23/16. At the time was admitted due to heavy, daily alcohol consumption, with an admission BAL of 436, and depression. He was discharged on Celexa , Campral, Neurontin. He has a  history of severe alcohol dependence. States that 2-3 days  after his recent discharge from the inpatient unit  he relapsed on ETOH . Since then he has been drinking daily and heavily. His admission BAL was 368. Admission UDS negative . Of note, patient states he has not been taking prescribed medications over recent days .  He presented to the ED reporting worsening depression , suicidal ideations. At this time presents with symptoms of alcohol WDL - presents flushed, tremulous.  Dx- Alcohol Dependence, Alcohol WDL , Alcohol Induced Mood Disorder, versus MDD  Plan - Ativan Detox Protocol to minimize risk of alcohol WDL symptoms , continue Neurontin at 100 mgrs TID, restart Celexa at 10 mgrs QDAY initially

## 2016-07-06 NOTE — ED Notes (Signed)
TTS assessment in progress via tele psych with [name].
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
## 2016-07-07 NOTE — BHH Group Notes (Signed)
[place] LCSW Group Therapy 07/07/2016 1:15pm  Type of Therapy and Topic: Group Therapy: Avoiding Self-Sabotaging and Enabling Behaviors   Participation Level: Minimal  Description of Group:  Learn how to identify obstacles, self-sabotaging and enabling behaviors, what are they, why do we do them and what needs do these behaviors meet? Discuss unhealthy relationships and how to have positive healthy boundaries with those that sabotage and enable. Explore aspects of self-sabotage and enabling in yourself and how to limit these self-destructive behaviors in everyday life. A scaling question is used to help patient look at where they are now in their motivation to change, from 1 to 10 (lowest to highest motivation).   Therapeutic Goals:  1. Patient will identify one obstacle that relates to self-sabotage and enabling behaviors 2. Patient will identify one personal self-sabotaging or enabling behavior they did prior to admission 3. Patient able to establish a plan to change the above identified behavior they did prior to admission:  4. Patient will demonstrate ability to communicate their needs through discussion and/or role plays.  Summary of Patient Progress:  Pt was attentive to group discussion but did not participate in discussion.   Therapeutic Modalities:  Cognitive Behavioral Therapy  Person-Centered Therapy  Motivational Interviewing   Adriana Reams, LCSW 07/07/2016 5:07 PM

## 2016-07-07 NOTE — Progress Notes (Signed)
Patient ID: Barry Moore, male   DOB: Feb 15, 1966, 51 y.o.   MRN: [id number]  DAR: Pt. Denies SI/HI and A/V Hallucinations. He reports sleep is poor, appetite is poor, energy level is low, and concentration is poor. He rates his depression and hopelessness 9/10. He rates his anxiety 8/10. Scheduled medications administered to patient per physician's orders. Patient reports withdrawal symptoms including agitation, irritability, chilling, runny nose, and is visibly tremulous. He is given a PRN Ativan along with scheduled Ativan to combat his withdrawal symptoms. MD Cobos was made aware of this and patient's CIWA score. Writer also informed MD Cobos of patient's elevated BP. Patient presents with flat affect and depressed mood. He is disheveled with body odor and currently poor hygiene. He is seen in the milieu intermittently, he reports "I'm trying to walk and see if it makes me feel better." Q15 minute checks are maintained for safety.

## 2016-07-07 NOTE — Progress Notes (Signed)
Pt rated his day a 3. Pt goal is to learn how to stop thinking about losing his friends last weeks. Pt state two of his close friends died back to back last week and he is not coping well with their lose. This Probation officer asked if he needed to talk with a chaplin and he refused.

## 2016-07-07 NOTE — Progress Notes (Signed)
Pt did not attend wrap-up group   

## 2016-07-07 NOTE — Progress Notes (Signed)
Nursing Progress Note: 7p-7a D: Pt currently presents with a depressed/anxious affect and behavior. Pt states "I feel like my detox is definitely better than it was yesterday." Interacting appropriately with milieu. Pt reports ok sleep with current medication regimen.   A: Pt provided with medications per providers orders. Pt's labs and vitals were monitored throughout the night. Pt supported emotionally and encouraged to express concerns and questions. Pt educated on medications.  R: Pt's safety ensured with 15 minute and environmental checks. Pt currently denies SI/HI/Self Harm and AVH. Pt verbally contracts to seek staff if SI/HI or A/VH occurs and to consult with staff before acting on any harmful thoughts. Will continue to monitor.

## 2016-07-07 NOTE — Progress Notes (Signed)
Recreation Therapy Notes  Date: 07/07/16 Time: 0930 Location: 400 Hall Dayroom  Group Topic: Stress Management  Goal Area(s) Addresses:  Patient will verbalize importance of using healthy stress management.  Patient will identify positive emotions associated with healthy stress management.   Intervention: Stress Management  Activity :  Meditation.  LRT introduced the stress management technique of meditation.  LRT played a meditation on choice from the Calm app to guide patients through the meditation.  Patients were to follow along as the meditation was played to engage in the technique.  Education:  Stress Management, Discharge Planning.   Education Outcome: Acknowledges edcuation/In group clarification offered/Needs additional education  Clinical Observations/Feedback: Pt did not attend group.   Victorino Sparrow, LRT/CTRS         Victorino Sparrow A 07/07/2016 12:50 PM

## 2016-07-07 NOTE — Progress Notes (Signed)
[facility] MD Progress Note  07/07/2016 2:12 PM Barry Moore  MRN:  [id number] Subjective:  Patient reports feeling partially better today, although states that he still feels shaky and tremulous . Denies any suicidal ideations, but does state he is feeling depressed, which he feels is related in part to recent deaths of friends, one of whom died about 1-2 weeks ago from drug overdose. Objective : I have discussed case with treatment team and have met with patient . Patient presents tremulous, jittery, but less severely so than yesterday. He does continue to score on CIWA scores, and as discussed with Nursing staff, CIWA score was 16 earlier today. At this time he is tolerating Ativan detox protocol well, denies side effects. As above, reports a sense of depression/ grief, relating to death of friend recently . Responsive to support, encouragement, empathy. Denies any current suicidal ideations.   Principal Problem: Alcohol dependence with alcohol-induced mood disorder ([place]) Diagnosis:   Patient Active Problem List   Diagnosis Date Noted  . Major depressive disorder, recurrent severe without psychotic features ([place]) [F33.2] 07/06/2016  . Alcohol-induced mood disorder ([place]) [F10.94] 06/15/2016  . Seizure ([place]) [R56.9] 05/26/2016  . Tobacco abuse [Z72.0] 05/26/2016  . Homeless [Z59.0] 05/26/2016  . Essential hypertension [I10] 05/26/2016  . MDD (major depressive disorder), recurrent severe, without psychosis ([place]) [F33.2] 03/06/2016  . Alcohol dependence with alcohol-induced mood disorder ([place]) [F10.24] 02/14/2016  . Severe alcohol withdrawal without perceptual disturbances without complication ([place]) [Y30.160] 02/14/2016  . Alcoholic hepatitis without ascites [K70.10] 02/14/2016  . Thrombocytopenia ([place]) [D69.6] 02/14/2016   Total Time spent with patient: 20 minutes  Past Medical History:  Past Medical History:  Diagnosis Date  . Alcoholic hepatitis   . Depression   . ETOH abuse   .  Hypertension   . Seizures ([place])     Past Surgical History:  Procedure Laterality Date  . HERNIA REPAIR    . LEG SURGERY     Family History:  Family History  Problem Relation Age of Onset  . Diabetes Mother   . Alcoholism Mother   . Emphysema Father   . Lung cancer Father   . Alcoholism Father    Social History:  History  Alcohol Use  . Yes    Comment: 5th liquor and one Forty daily       History  Drug Use No    Social History   Social History  . Marital status: Divorced    Spouse name: N/A  . Number of children: N/A  . Years of education: N/A   Social History Main Topics  . Smoking status: Current Every Day Smoker    Packs/day: 2.00    Types: Cigarettes  . Smokeless tobacco: Never Used  . Alcohol use Yes     Comment: 5th liquor and one Forty daily    . Drug use: No  . Sexual activity: Not Currently   Other Topics Concern  . None   Social History Narrative  . None   Additional Social History:   Sleep: improving   Appetite:  improving   Current Medications: Current Facility-Administered Medications  Medication Dose Route Frequency Provider Last Rate Last Dose  . acetaminophen (TYLENOL) tablet 650 mg  650 mg Oral Q6H PRN Patrecia Pour, NP      . amLODipine (NORVASC) tablet 5 mg  5 mg Oral Daily Patrecia Pour, NP   5 mg at 07/07/16 0819  . citalopram (CELEXA) tablet 10 mg  10 mg Oral Daily Felicita Gage  A Lillion Elbert, MD   10 mg at 07/07/16 0819  . gabapentin (NEURONTIN) capsule 100 mg  100 mg Oral TID Jenne Campus, MD   100 mg at 07/07/16 1125  . hydrOXYzine (ATARAX/VISTARIL) tablet 25 mg  25 mg Oral Q6H PRN Jenne Campus, MD      . loperamide (IMODIUM) capsule 2-4 mg  2-4 mg Oral PRN Jenne Campus, MD      . LORazepam (ATIVAN) tablet 1 mg  1 mg Oral Q6H PRN Jenne Campus, MD   1 mg at 07/07/16 1125  . LORazepam (ATIVAN) tablet 1 mg  1 mg Oral TID Jenne Campus, MD   1 mg at 07/07/16 1125   Followed by  . [START ON 07/08/2016] LORazepam (ATIVAN)  tablet 1 mg  1 mg Oral BID Jenne Campus, MD       Followed by  . [START ON 07/09/2016] LORazepam (ATIVAN) tablet 1 mg  1 mg Oral Daily Airam Runions A Denece Shearer, MD      . magnesium hydroxide (MILK OF MAGNESIA) suspension 30 mL  30 mL Oral Daily PRN Patrecia Pour, NP      . multivitamin with minerals tablet 1 tablet  1 tablet Oral Daily Jenne Campus, MD   1 tablet at 07/07/16 0818  . ondansetron (ZOFRAN-ODT) disintegrating tablet 4 mg  4 mg Oral Q6H PRN Jenne Campus, MD      . thiamine (VITAMIN B-1) tablet 100 mg  100 mg Oral Daily Jenne Campus, MD   100 mg at 07/07/16 0818    Lab Results:  Results for orders placed or performed during the hospital encounter of 07/05/16 (from the past 48 hour(s))  Comprehensive metabolic panel     Status: Abnormal   Collection Time: 07/05/16  9:35 PM  Result Value Ref Range   Sodium 139 135 - 145 mmol/L   Potassium 3.6 3.5 - 5.1 mmol/L   Chloride 105 101 - 111 mmol/L   CO2 24 22 - 32 mmol/L   Glucose, Bld 90 65 - 99 mg/dL   BUN 10 6 - 20 mg/dL   Creatinine, Ser 0.81 0.61 - 1.24 mg/dL   Calcium 8.4 (L) 8.9 - 10.3 mg/dL   Total Protein 7.5 6.5 - 8.1 g/dL   Albumin 3.9 3.5 - 5.0 g/dL   AST 71 (H) 15 - 41 U/L   ALT 38 17 - 63 U/L   Alkaline Phosphatase 170 (H) 38 - 126 U/L   Total Bilirubin 0.2 (L) 0.3 - 1.2 mg/dL   GFR calc non Af Amer >60 >60 mL/min   GFR calc Af Amer >60 >60 mL/min    Comment: (NOTE) The eGFR has been calculated using the CKD EPI equation. This calculation has not been validated in all clinical situations. eGFR's persistently <60 mL/min signify possible Chronic Kidney Disease.    Anion gap 10 5 - 15  Ethanol     Status: Abnormal   Collection Time: 07/05/16  9:35 PM  Result Value Ref Range   Alcohol, Ethyl (B) 368 (HH) <5 mg/dL    Comment:        LOWEST DETECTABLE LIMIT FOR SERUM ALCOHOL IS 5 mg/dL FOR MEDICAL PURPOSES ONLY CRITICAL RESULT CALLED TO, READ BACK BY AND VERIFIED WITH: LAWSON,A RN 4.16.18 _0   ZANDO,C   cbc     Status: Abnormal   Collection Time: 07/05/16  9:35 PM  Result Value Ref Range   WBC 13.5 (H) 4.0 - 10.5 K/uL  RBC 4.21 (L) 4.22 - 5.81 MIL/uL   Hemoglobin 12.6 (L) 13.0 - 17.0 g/dL   HCT 37.2 (L) 39.0 - 52.0 %   MCV 88.4 78.0 - 100.0 fL   MCH 29.9 26.0 - 34.0 pg   MCHC 33.9 30.0 - 36.0 g/dL   RDW 13.9 11.5 - 15.5 %   Platelets 213 150 - 400 K/uL  Rapid urine drug screen (hospital performed)     Status: None   Collection Time: 07/05/16  9:40 PM  Result Value Ref Range   Opiates NONE DETECTED NONE DETECTED   Cocaine NONE DETECTED NONE DETECTED   Benzodiazepines NONE DETECTED NONE DETECTED   Amphetamines NONE DETECTED NONE DETECTED   Tetrahydrocannabinol NONE DETECTED NONE DETECTED   Barbiturates NONE DETECTED NONE DETECTED    Comment:        DRUG SCREEN FOR MEDICAL PURPOSES ONLY.  IF CONFIRMATION IS NEEDED FOR ANY PURPOSE, NOTIFY LAB WITHIN 5 DAYS.        LOWEST DETECTABLE LIMITS FOR URINE DRUG SCREEN Drug Class       Cutoff (ng/mL) Amphetamine      1000 Barbiturate      200 Benzodiazepine   450 Tricyclics       388 Opiates          300 Cocaine          300 THC              50     Blood Alcohol level:  Lab Results  Component Value Date   ETH 368 (HH) 07/05/2016   ETH 114 (H) 82/80/0349    Metabolic Disorder Labs: Lab Results  Component Value Date   HGBA1C 5.2 03/07/2016   MPG 103 03/07/2016   Lab Results  Component Value Date   PROLACTIN 23.2 (H) 03/07/2016   Lab Results  Component Value Date   CHOL 215 (H) 03/07/2016   TRIG 82 03/07/2016   HDL 105 03/07/2016   CHOLHDL 2.0 03/07/2016   VLDL 16 03/07/2016   LDLCALC 94 03/07/2016    Physical Findings: AIMS: Facial and Oral Movements Muscles of Facial Expression: None, normal Lips and Perioral Area: None, normal Jaw: None, normal Tongue: None, normal,Extremity Movements Upper (arms, wrists, hands, fingers): None, normal Lower (legs, knees, ankles, toes): None, normal, Trunk  Movements Neck, shoulders, hips: None, normal, Overall Severity Severity of abnormal movements (highest score from questions above): None, normal Incapacitation due to abnormal movements: None, normal Patient's awareness of abnormal movements (rate only patient's report): No Awareness, Dental Status Current problems with teeth and/or dentures?: No Does patient usually wear dentures?: No  CIWA:  CIWA-Ar Total: 16 COWS:     Musculoskeletal: Strength & Muscle Tone: remains tremulous  Gait & Station: normal Patient leans: N/A  Psychiatric Specialty Exam: Physical Exam  ROS describes mild headache, denies visual disturbances , no chest pain, no shortness of breath, no vomiting   Blood pressure (!) 153/93, pulse 82, temperature 98.8 F (37.1 C), temperature source Oral, resp. rate 18, height _0  (1.727 m), weight 81.6 kg (180 lb), SpO2 100 %.Body mass index is 27.37 kg/m.  General Appearance: Fairly Groomed  Eye Contact:  Fair  Speech:  Normal Rate  Volume:  Decreased  Mood:  depressed, anxious   Affect:  remains constricted, but does smile appropriately at times   Thought Process:  Linear and Descriptions of Associations: Intact  Orientation:  Full (Time, Place, and Person) no evidence of delirium at this time   Thought  Content:  denies hallucinations, no delusions expressed, no visual hallucinations or disturbances   Suicidal Thoughts:  No currently denies any suicidal plan or intention, denies any homicidal or violent ideations   Homicidal Thoughts:  No  Memory:  recent and remote grossly intact   Judgement:  Fair  Insight:  Fair  Psychomotor Activity:  less restless, remains tremulous   Concentration:  Concentration: Good and Attention Span: Good  Recall:  Good  Fund of Knowledge:  Good  Language:  Good  Akathisia:  Negative  Handed:  Right  AIMS (if indicated):     Assets:  Desire for Improvement Resilience  ADL's:  Improving   Cognition:  WNL  Sleep:  Number of Hours:  6.75   Assessment - patient presents with ongoing symptoms of alcohol WDL  , but less severe than yesterday. Remains tremulous, jittery, but not presenting with headache, visual disturbances, mentation changes or confusion. Tolerating Ativan detox protocol well . Reports depression, partly related to recent death of a friend, but denies any active SI.   Treatment Plan Summary: Daily contact with patient to assess and evaluate symptoms and progress in treatment, Medication management, Plan inpatient admission  and medications as below Encourage ongoing group and milieu participation to work on coping skills and symptom reduction Continue Celexa 10 mgrs QDAY for depression, anxiety  Continue Neurontin 100 mgrs TID for mood disorder, anxiety  Continue Ativan detox protocol to minimize symptoms of alcohol WDL Continue Norvasc 5 mgrs QDAY for HTN.  Treatment team working on disposition planning - patient encouraged to consider going to a rehab after discharge, particularly in the context of recent discharge/relapse.  Jenne Campus, MD 07/07/2016, 2:12 PM

## 2016-07-08 MED ORDER — IBUPROFEN 600 MG PO TABS
600.0000 mg | ORAL_TABLET | Freq: Four times a day (QID) | ORAL | Status: DC | PRN
  Administered 2016-07-08 – 2016-07-09 (×3): 600 mg via ORAL
  Filled 2016-07-08 (×3): qty 1

## 2016-07-08 MED ORDER — TRAZODONE HCL 100 MG PO TABS
100.0000 mg | ORAL_TABLET | Freq: Every evening | ORAL | Status: DC | PRN
  Administered 2016-07-08 – 2016-07-11 (×4): 100 mg via ORAL
  Filled 2016-07-08: qty 42
  Filled 2016-07-08 (×3): qty 1
  Filled 2016-07-08: qty 14
  Filled 2016-07-08: qty 42
  Filled 2016-07-08 (×4): qty 1
  Filled 2016-07-08: qty 42
  Filled 2016-07-08 (×5): qty 1

## 2016-07-08 NOTE — Progress Notes (Signed)
Patient ID: Moua Rasmusson, male   DOB: 12-20-1965, 51 y.o.   MRN: [id number]  Pt currently presents with a flat affect and anxious behavior. Pt states "it was a better day." Exhibiting improving signs and symptoms of withdrawal including tremors and malaise. Pt reports good sleep with current medication regimen. Pt chooses not to attend karaoke group tonight. Interacts minimally with peers.   Pt provided with medications per providers orders. Pt's labs and vitals were monitored throughout the night. Pt given a 1:1 about emotional and mental status. Pt supported and encouraged to express concerns and questions. Pt educated on medications.  Pt's safety ensured with 15 minute and environmental checks.  Pt endorses passive HI towards someone outside of [facility], refused to elaborate. Pt currently denies SI and A/V hallucinations. Pt verbally agrees to seek staff if SI or A/VH occurs, HI worsens and to consult with staff before acting on any harmful thoughts. Will continue POC.

## 2016-07-08 NOTE — BHH Group Notes (Signed)
[place] LCSW Group Therapy 07/08/2016 1:15 PM  Type of Therapy: Group Therapy- Feelings about Diagnosis  Participation Level: Minimal  Participation Quality:  Attentive  Affect:  Flat  Cognitive: Alert and Oriented   Insight:  Unable to assess  Engagement in Therapy: Limited   Modes of Intervention: Clarification, Confrontation, Discussion, Education, Exploration, Limit-setting, Orientation, Problem-solving, Rapport Building, Art therapist, Socialization and Support  Description of Group:   This group will allow patients to explore their thoughts and feelings about diagnoses they have received. Patients will be guided to explore their level of understanding and acceptance of these diagnoses. Facilitator will encourage patients to process their thoughts and feelings about the reactions of others to their diagnosis, and will guide patients in identifying ways to discuss their diagnosis with significant others in their lives. This group will be process-oriented, with patients participating in exploration of their own experiences as well as giving and receiving support and challenge from other group members.  Summary of Progress/Problems:  Pt did not participate in group discussion but remained attentive throughout.  Therapeutic Modalities:   Cognitive Behavioral Therapy Solution Focused Therapy Motivational Interviewing Relapse Prevention Therapy  Adriana Reams, LCSW 07/08/2016 3:43 PM

## 2016-07-08 NOTE — Progress Notes (Signed)
Patient ID: Barry Moore, male   DOB: 02/17/1966, 51 y.o.   MRN: [id number]  DAR: Pt. Denies SI/HI and A/V Hallucinations. He presents with depressed affect and mood. He reports sleep is poor, appetite is fair, energy level is normal, and concentration is poor. He rates depression 9/10, hopelessness 9/10, and anxiety 0/10. Patient does report a headache and received PRN Ibuprofen when provided some relief. He reports withdrawal symptoms including tremors, diarrhea, runny nose, and irritability. He also reports some lightheadedness. Patient's BP remains elevated, MD Cobos is aware. Support and encouragement provided to the patient to come to writer with questions or concerns. Scheduled medications administered to patient per physician's orders. Patient is seen in the milieu more frequently today than yesterday. He appears less tremulous and his CIWA score is decreasing. Q15 minute checks are maintained for safety.

## 2016-07-08 NOTE — Progress Notes (Signed)
[facility] MD Progress Note  07/08/2016 6:02 PM Barry Moore  MRN:  [id number] Subjective:  Patient reports feeling better, and states he feels less shaky and less tremulous than yesterday. He reports ongoing sadness and depression regarding recent death of a friend, whom he states died from accidental opiate overdose. Denies any suicidal ideations. Objective : I have discussed case with treatment team and have met with patient . Patient presents improved today- still slightly tremulous, but definitely less so than yesterday, when tremors were very apparent. Gait improved. At this time not restless , not agitated, and denies any visual or ocular disturbances. Tolerating detox protocol well, denies side effects- not  sedated- continues to present alert and attentive . As above, reports depression related to loss of friend, but presents with improving range of affect .  No disruptive or agitated behaviors on unit, going to some groups.   Principal Problem: Alcohol dependence with alcohol-induced mood disorder ([place]) Diagnosis:   Patient Active Problem List   Diagnosis Date Noted  . Major depressive disorder, recurrent severe without psychotic features ([place]) [F33.2] 07/06/2016  . Alcohol-induced mood disorder ([place]) [F10.94] 06/15/2016  . Seizure ([place]) [R56.9] 05/26/2016  . Tobacco abuse [Z72.0] 05/26/2016  . Homeless [Z59.0] 05/26/2016  . Essential hypertension [I10] 05/26/2016  . MDD (major depressive disorder), recurrent severe, without psychosis ([place]) [F33.2] 03/06/2016  . Alcohol dependence with alcohol-induced mood disorder ([place]) [F10.24] 02/14/2016  . Severe alcohol withdrawal without perceptual disturbances without complication ([place]) [M01.027] 02/14/2016  . Alcoholic hepatitis without ascites [K70.10] 02/14/2016  . Thrombocytopenia ([place]) [D69.6] 02/14/2016   Total Time spent with patient: 20 minutes  Past Medical History:  Past Medical History:  Diagnosis Date  . Alcoholic hepatitis    . Depression   . ETOH abuse   . Hypertension   . Seizures ([place])     Past Surgical History:  Procedure Laterality Date  . HERNIA REPAIR    . LEG SURGERY     Family History:  Family History  Problem Relation Age of Onset  . Diabetes Mother   . Alcoholism Mother   . Emphysema Father   . Lung cancer Father   . Alcoholism Father    Social History:  History  Alcohol Use  . Yes    Comment: 5th liquor and one Forty daily       History  Drug Use No    Social History   Social History  . Marital status: Divorced    Spouse name: N/A  . Number of children: N/A  . Years of education: N/A   Social History Main Topics  . Smoking status: Current Every Day Smoker    Packs/day: 2.00    Types: Cigarettes  . Smokeless tobacco: Never Used  . Alcohol use Yes     Comment: 5th liquor and one Forty daily    . Drug use: No  . Sexual activity: Not Currently   Other Topics Concern  . None   Social History Narrative  . None   Additional Social History:   Sleep: better   Appetite:  better  Current Medications: Current Facility-Administered Medications  Medication Dose Route Frequency Provider Last Rate Last Dose  . acetaminophen (TYLENOL) tablet 650 mg  650 mg Oral Q6H PRN Patrecia Pour, NP      . amLODipine (NORVASC) tablet 5 mg  5 mg Oral Daily Patrecia Pour, NP   5 mg at 07/08/16 0753  . citalopram (CELEXA) tablet 10 mg  10 mg Oral Daily  Jenne Campus, MD   10 mg at 07/08/16 0753  . gabapentin (NEURONTIN) capsule 100 mg  100 mg Oral TID Jenne Campus, MD   100 mg at 07/08/16 1724  . hydrOXYzine (ATARAX/VISTARIL) tablet 25 mg  25 mg Oral Q6H PRN Jenne Campus, MD      . ibuprofen (ADVIL,MOTRIN) tablet 600 mg  600 mg Oral Q6H PRN Jenne Campus, MD   600 mg at 07/08/16 1507  . loperamide (IMODIUM) capsule 2-4 mg  2-4 mg Oral PRN Jenne Campus, MD      . LORazepam (ATIVAN) tablet 1 mg  1 mg Oral Q6H PRN Jenne Campus, MD   1 mg at 07/07/16 1125  . [START ON  07/09/2016] LORazepam (ATIVAN) tablet 1 mg  1 mg Oral Daily Fernando A Cobos, MD      . magnesium hydroxide (MILK OF MAGNESIA) suspension 30 mL  30 mL Oral Daily PRN Patrecia Pour, NP      . multivitamin with minerals tablet 1 tablet  1 tablet Oral Daily Jenne Campus, MD   1 tablet at 07/08/16 0753  . ondansetron (ZOFRAN-ODT) disintegrating tablet 4 mg  4 mg Oral Q6H PRN Jenne Campus, MD      . thiamine (VITAMIN B-1) tablet 100 mg  100 mg Oral Daily Jenne Campus, MD   100 mg at 07/08/16 0753    Lab Results:  No results found for this or any previous visit (from the past 48 hour(s)).  Blood Alcohol level:  Lab Results  Component Value Date   ETH 368 (HH) 07/05/2016   ETH 114 (H) 97/04/6376    Metabolic Disorder Labs: Lab Results  Component Value Date   HGBA1C 5.2 03/07/2016   MPG 103 03/07/2016   Lab Results  Component Value Date   PROLACTIN 23.2 (H) 03/07/2016   Lab Results  Component Value Date   CHOL 215 (H) 03/07/2016   TRIG 82 03/07/2016   HDL 105 03/07/2016   CHOLHDL 2.0 03/07/2016   VLDL 16 03/07/2016   LDLCALC 94 03/07/2016    Physical Findings: AIMS: Facial and Oral Movements Muscles of Facial Expression: None, normal Lips and Perioral Area: None, normal Jaw: None, normal Tongue: None, normal,Extremity Movements Upper (arms, wrists, hands, fingers): None, normal Lower (legs, knees, ankles, toes): None, normal, Trunk Movements Neck, shoulders, hips: None, normal, Overall Severity Severity of abnormal movements (highest score from questions above): None, normal Incapacitation due to abnormal movements: None, normal Patient's awareness of abnormal movements (rate only patient's report): No Awareness, Dental Status Current problems with teeth and/or dentures?: No Does patient usually wear dentures?: No  CIWA:  CIWA-Ar Total: 4 COWS:     Musculoskeletal: Strength & Muscle Tone: less tremulous  Gait & Station: normal Patient leans:  N/A  Psychiatric Specialty Exam: Physical Exam  ROS no headache today, no visual disturbances, no vomiting   Blood pressure 132/86, pulse 98, temperature 98.5 F (36.9 C), temperature source Oral, resp. rate 18, height _0  (1.727 m), weight 81.6 kg (180 lb), SpO2 100 %.Body mass index is 27.37 kg/m.  General Appearance: improved grooming   Eye Contact:  improving   Speech:  Normal Rate  Volume:  Decreased  Mood:  less depressed, mood improving   Affect:  remains sad, but affect more reactive   Thought Process:  Goal Directed and Descriptions of Associations: Intact  Orientation:  Full (Time, Place, and Person) no evidence of delirium at this time  Thought Content:  no hallucinations, no delusions, not internally preoccupied   Suicidal Thoughts:  No currently denies any suicidal plan or intention, denies any homicidal or violent ideations   Homicidal Thoughts:  No  Memory:  recent and remote grossly intact   Judgement:  Other:  improving   Insight:  improving   Psychomotor Activity:  improved, less tremulous, no restlessness   Concentration:  Concentration: Good and Attention Span: Good  Recall:  Good  Fund of Knowledge:  Good  Language:  Good  Akathisia:  No  Handed:  Right  AIMS (if indicated):     Assets:  Desire for Improvement Resilience  ADL's:  Improving    Cognition:  WNL  Sleep:  Number of Hours: 5.5   Assessment - patient improving gradually- today significantly less tremulous and symptoms of alcohol WDL appear to be abating . Not restless or in any acute distress at this time. Vitals stable. Remains depressed, sad , but affect more reactive and denies suicidal ideations. Tolerating medications well .    Treatment Plan Summary: Treatment plan reviewed as below today 4/19 Daily contact with patient to assess and evaluate symptoms and progress in treatment, Medication management, Plan inpatient admission  and medications as below Encourage ongoing group and milieu  participation to work on coping skills and symptom reduction Continue Celexa 10 mgrs QDAY for depression, anxiety  Continue Neurontin 100 mgrs TID for mood disorder, anxiety  Continue Ativan detox protocol to minimize symptoms of alcohol WDL Continue Norvasc 5 mgrs QDAY for HTN.  Treatment team working on disposition planning - patient encouraged to consider going to a rehab after discharge, particularly in the context of recent discharge/relapse.  Jenne Campus, MD 07/08/2016, 6:02 PMPatient ID: Barry Moore, male   DOB: [birth year], 51 y.o.   MRN: [id number]

## 2016-07-09 DIAGNOSIS — R45851 Suicidal ideations: Secondary | ICD-10-CM

## 2016-07-09 DIAGNOSIS — F17210 Nicotine dependence, cigarettes, uncomplicated: Secondary | ICD-10-CM

## 2016-07-09 MED ORDER — GABAPENTIN 300 MG PO CAPS
300.0000 mg | ORAL_CAPSULE | Freq: Every day | ORAL | Status: DC
  Administered 2016-07-09 – 2016-07-11 (×3): 300 mg via ORAL
  Filled 2016-07-09 (×4): qty 1
  Filled 2016-07-09: qty 21
  Filled 2016-07-09: qty 1

## 2016-07-09 MED ORDER — CITALOPRAM HYDROBROMIDE 20 MG PO TABS
20.0000 mg | ORAL_TABLET | Freq: Every day | ORAL | Status: DC
Start: 2016-07-10 — End: 2016-07-12
  Administered 2016-07-10 – 2016-07-12 (×3): 20 mg via ORAL
  Filled 2016-07-09: qty 1
  Filled 2016-07-09: qty 21
  Filled 2016-07-09 (×3): qty 1

## 2016-07-09 NOTE — Plan of Care (Signed)
Problem: Physical Regulation: Goal: Complications related to the disease process, condition or treatment will be avoided or minimized Outcome: Progressing Withdrawal symptoms minimized by medication regimen  Problem: Safety: Goal: Ability to remain free from injury will improve Outcome: Progressing Pt remains safe on the unit

## 2016-07-09 NOTE — Progress Notes (Signed)
D    Pt is pleasant on approach and cooperative    He denies SI/HI and denies A/V Hallucinations   He tends to isolate but his behavior and interactions are appropriate He is hoping to get into a long term treatment program A    Verbal support given   Medications administered and effectiveness monitored   Q 15 min checks    R    Pt is safe at present time and receptive to verbal support

## 2016-07-09 NOTE — Progress Notes (Signed)
[facility] MD Progress Note  07/09/2016 1:27 PM Barry Moore  MRN:  [id number] Subjective:  Time discussing with Mr. Barry Moore. He reports that he knows his alcohol has been a problem, and he really wants to quit. He is agreeable to arrangements being made for him to go to [facility].  He has never been there before. He is hopeful he'll be able to stop alcohol use. He feels like Celexa has helped a little bit, but too soon to say. He continues to have trouble sleeping, and is agreeable to [name] consolidating gabapentin to 300 mg at night. He denies any acute SI in hospital, but continues with passive SI and hopelessness.  He feels like the withdrawals are mild and generally gone at this point.  Seizures are altered mental status.  Principal Problem: Alcohol dependence with alcohol-induced mood disorder ([place]) Diagnosis:   Patient Active Problem List   Diagnosis Date Noted  . Major depressive disorder, recurrent severe without psychotic features ([place]) [F33.2] 07/06/2016  . Alcohol-induced mood disorder ([place]) [F10.94] 06/15/2016  . Seizure ([place]) [R56.9] 05/26/2016  . Tobacco abuse [Z72.0] 05/26/2016  . Homeless [Z59.0] 05/26/2016  . Essential hypertension [I10] 05/26/2016  . MDD (major depressive disorder), recurrent severe, without psychosis ([place]) [F33.2] 03/06/2016  . Alcohol dependence with alcohol-induced mood disorder ([place]) [F10.24] 02/14/2016  . Severe alcohol withdrawal without perceptual disturbances without complication ([place]) [N36.144] 02/14/2016  . Alcoholic hepatitis without ascites [K70.10] 02/14/2016  . Thrombocytopenia ([place]) [D69.6] 02/14/2016   Total Time spent with patient: 20 minutes  Past Psychiatric History: See intake H&P for full details. Reviewed, with no updates at this time.   Past Medical History:  Past Medical History:  Diagnosis Date  . Alcoholic hepatitis   . Depression   . ETOH abuse   . Hypertension   . Seizures ([place])     Past Surgical History:  Procedure Laterality Date   . HERNIA REPAIR    . LEG SURGERY     Family History:  Family History  Problem Relation Age of Onset  . Diabetes Mother   . Alcoholism Mother   . Emphysema Father   . Lung cancer Father   . Alcoholism Father    Family Psychiatric  History: See intake H&P for full details. Reviewed, with no updates at this time.  Social History:  History  Alcohol Use  . Yes    Comment: 5th liquor and one Forty daily       History  Drug Use No    Social History   Social History  . Marital status: Divorced    Spouse name: N/A  . Number of children: N/A  . Years of education: N/A   Social History Main Topics  . Smoking status: Current Every Day Smoker    Packs/day: 2.00    Types: Cigarettes  . Smokeless tobacco: Never Used  . Alcohol use Yes     Comment: 5th liquor and one Forty daily    . Drug use: No  . Sexual activity: Not Currently   Other Topics Concern  . None   Social History Narrative  . None   Additional Social History:                         Sleep: Fair  Appetite:  Fair  Current Medications: Current Facility-Administered Medications  Medication Dose Route Frequency Provider Last Rate Last Dose  . acetaminophen (TYLENOL) tablet 650 mg  650 mg Oral Q6H PRN Patrecia Pour, NP      .  amLODipine (NORVASC) tablet 5 mg  5 mg Oral Daily Patrecia Pour, NP   5 mg at 07/09/16 9233  . [START ON 07/10/2016] citalopram (CELEXA) tablet 20 mg  20 mg Oral Daily Aundra Dubin, MD      . gabapentin (NEURONTIN) capsule 300 mg  300 mg Oral QHS Aundra Dubin, MD      . hydrOXYzine (ATARAX/VISTARIL) tablet 25 mg  25 mg Oral Q6H PRN Jenne Campus, MD      . ibuprofen (ADVIL,MOTRIN) tablet 600 mg  600 mg Oral Q6H PRN Jenne Campus, MD   600 mg at 07/09/16 0076  . loperamide (IMODIUM) capsule 2-4 mg  2-4 mg Oral PRN Jenne Campus, MD      . LORazepam (ATIVAN) tablet 1 mg  1 mg Oral Q6H PRN Jenne Campus, MD   1 mg at 07/07/16 1125  . magnesium  hydroxide (MILK OF MAGNESIA) suspension 30 mL  30 mL Oral Daily PRN Patrecia Pour, NP      . multivitamin with minerals tablet 1 tablet  1 tablet Oral Daily Jenne Campus, MD   1 tablet at 07/09/16 2263  . ondansetron (ZOFRAN-ODT) disintegrating tablet 4 mg  4 mg Oral Q6H PRN Jenne Campus, MD      . thiamine (VITAMIN B-1) tablet 100 mg  100 mg Oral Daily Jenne Campus, MD   100 mg at 07/09/16 0921  . traZODone (DESYREL) tablet 100 mg  100 mg Oral QHS,MR X 1 Rozetta Nunnery, NP   100 mg at 07/08/16 2200    Lab Results: No results found for this or any previous visit (from the past 48 hour(s)).  Blood Alcohol level:  Lab Results  Component Value Date   ETH 368 (HH) 07/05/2016   ETH 114 (H) 33/54/5625    Metabolic Disorder Labs: Lab Results  Component Value Date   HGBA1C 5.2 03/07/2016   MPG 103 03/07/2016   Lab Results  Component Value Date   PROLACTIN 23.2 (H) 03/07/2016   Lab Results  Component Value Date   CHOL 215 (H) 03/07/2016   TRIG 82 03/07/2016   HDL 105 03/07/2016   CHOLHDL 2.0 03/07/2016   VLDL 16 03/07/2016   LDLCALC 94 03/07/2016    Physical Findings: AIMS: Facial and Oral Movements Muscles of Facial Expression: None, normal Lips and Perioral Area: None, normal Jaw: None, normal Tongue: None, normal,Extremity Movements Upper (arms, wrists, hands, fingers): None, normal Lower (legs, knees, ankles, toes): None, normal, Trunk Movements Neck, shoulders, hips: None, normal, Overall Severity Severity of abnormal movements (highest score from questions above): None, normal Incapacitation due to abnormal movements: None, normal Patient's awareness of abnormal movements (rate only patient's report): No Awareness, Dental Status Current problems with teeth and/or dentures?: No Does patient usually wear dentures?: No  CIWA:  CIWA-Ar Total: 0 COWS:     Musculoskeletal: Strength & Muscle Tone: within normal limits Gait & Station: normal Patient leans:  N/A  Psychiatric Specialty Exam: Physical Exam  ROS  Blood pressure 125/78, pulse 74, temperature 97.5 F (36.4 C), resp. rate 18, height 5\' 8"  (1.727 m), weight 81.6 kg (180 lb), SpO2 100 %.Body mass index is 27.37 kg/m.  General Appearance: Casual, Fairly Groomed and Large beard  Eye Contact:  Good  Speech:  Clear and Coherent  Volume:  Normal  Mood:  Dysphoric  Affect:  Congruent  Thought Process:  Goal Directed  Orientation:  Full (Time, Place, and Person)  Thought Content:  Logical  Suicidal Thoughts:  Yes.  without intent/plan  Homicidal Thoughts:  No  Memory:  Immediate;   Good  Judgement:  Good  Insight:  Good and Fair  Psychomotor Activity:  Normal  Concentration:  Concentration: Fair  Recall:  NA  Fund of Knowledge:  Good  Language:  Good  Akathisia:  Negative  Handed:  Right  AIMS (if indicated):     Assets:  Communication Skills Desire for Improvement  ADL's:  Intact  Cognition:  WNL  Sleep:  Number of Hours: 6.5     Treatment Plan Summary: Barry Moore is a 51 year old male with alcohol use disorder and depression.  He is out of the acute withdrawal from alcohol, but we continue to adjust medications for depression and insomnia which have been driving factors in his alcohol use. He is also been homeless and living in a tent which is an understandable trigger for his depression and alcohol use. He is agreeable to participating in alcohol rehabilitation at [facility].  I believe he'd be a good candidate for this, and he has not previously received this treatment.  He has been able to stay safe on unit.  He has high risk for relapse, and self-harm, and readmission. Requires continued psychiatric care for appropriate disposition.  Celexa increased to 20 mg Gabapentin consolidated to 300 mg nightly Trazodone 100 mg, with additional when necessary if needed Continue to monitor for any left over withdrawal symptoms No acute medical issues, continue antihypertensive with  amlodipine Tylenol and ibuprofen when necessary for arthritis pain Continue to coordinate with social work for transition to [employer], MD 07/09/2016, 1:27 PM

## 2016-07-09 NOTE — [facility] Group Notes (Signed)
Type of Therapy:  Group therapy  Participation Level:  Active  Participation Quality:  Attentive  Affect:  Flat  Cognitive:  Oriented  Insight:  Limited  Engagement in Therapy:  Limited  Modes of Intervention:  Discussion, Socialization  Summary of Progress/Problems:  Invited, chose not to attend.   Chaplain was here to lead a group on themes of hope and courage.

## 2016-07-09 NOTE — Progress Notes (Signed)
Nursing Note 07/09/2016 [id number]  Data Reports sleeping fair with PRN sleep med.  Rates depression 8/10, hopelessness 10/10, and anxiety 10/10. Affect 9 mood "5."  Denies HI, SI, AVH.  Reports headache this AM.   Attending groups, appropriate and polite with peers.  Spends free time in room usually reading or resting.  Action Spoke with patient 1:1, nurse offered support to patient throughout shift.  Received PRN for headache.  Continues to be monitored on 15 minute checks for safety.  Response PRN effective.

## 2016-07-09 NOTE — Progress Notes (Signed)
Recreation Therapy Notes  Date: 07/09/16 Time: 1000 Location: 300 Hall Dayroom  Group Topic: Communication, Team Building, Problem Solving  Goal Area(s) Addresses:  Patient will effectively work with peer towards shared goal.  Patient will identify skills used to make activity successful.  Patient will identify how skills used during activity can be used to reach post d/c goals.   Behavioral Response: Engaged  Intervention: STEM Activity  Activity: Geophysicist/field seismologist. In teams patients were given 12 plastic drinking straws and a length of masking tape. Using the materials provided patients were asked to build a landing pad to catch a golf ball dropped from approximately 6 feet in the air.   Education: Education officer, community, Discharge Planning   Education Outcome: Acknowledges education/In group clarification offered/Needs additional education.   Clinical Observations/Feedback: Pt was very active with group.  Pt was bright, social and helped the group come up with an idea to complete the activity.   Victorino Sparrow, LRT/CTRS         Victorino Sparrow A 07/09/2016 12:11 PM

## 2016-07-09 NOTE — Tx Team (Signed)
Interdisciplinary Treatment and Diagnostic Plan Update  07/09/2016 Time of Session: 0930 Barry Moore MRN: [id number]  Principal Diagnosis: Alcohol dependence with alcohol-induced mood disorder ([place])  Secondary Diagnoses: Principal Problem:   Alcohol dependence with alcohol-induced mood disorder ([place]) Active Problems:   Major depressive disorder, recurrent severe without psychotic features ([place])   Current Medications:  Current Facility-Administered Medications  Medication Dose Route Frequency Provider Last Rate Last Dose  . acetaminophen (TYLENOL) tablet 650 mg  650 mg Oral Q6H PRN Patrecia Pour, NP      . amLODipine (NORVASC) tablet 5 mg  5 mg Oral Daily Patrecia Pour, NP   5 mg at 07/09/16 1324  . [START ON 07/10/2016] citalopram (CELEXA) tablet 20 mg  20 mg Oral Daily Aundra Dubin, MD      . gabapentin (NEURONTIN) capsule 300 mg  300 mg Oral QHS Aundra Dubin, MD      . ibuprofen (ADVIL,MOTRIN) tablet 600 mg  600 mg Oral Q6H PRN Jenne Campus, MD   600 mg at 07/09/16 4010  . magnesium hydroxide (MILK OF MAGNESIA) suspension 30 mL  30 mL Oral Daily PRN Patrecia Pour, NP      . multivitamin with minerals tablet 1 tablet  1 tablet Oral Daily Jenne Campus, MD   1 tablet at 07/09/16 2725  . thiamine (VITAMIN B-1) tablet 100 mg  100 mg Oral Daily Jenne Campus, MD   100 mg at 07/09/16 3664  . traZODone (DESYREL) tablet 100 mg  100 mg Oral QHS,MR X 1 Rozetta Nunnery, NP   100 mg at 07/08/16 2200   PTA Medications: Prescriptions Prior to Admission  Medication Sig Dispense Refill Last Dose  . acamprosate (CAMPRAL) 333 MG tablet Take 2 tablets (666 mg total) by mouth 3 (three) times daily with meals. (Patient not taking: Reported on 07/06/2016) 180 tablet 0 Not Taking at Unknown time  . citalopram (CELEXA) 20 MG tablet Take 1 tablet (20 mg total) by mouth daily. (Patient not taking: Reported on 07/06/2016) 30 tablet 0 Not Taking at Unknown time  . gabapentin  (NEURONTIN) 100 MG capsule Take 2 capsules (200 mg total) by mouth 3 (three) times daily. (Patient not taking: Reported on 07/06/2016) 180 capsule 0 Not Taking at Unknown time  . nicotine polacrilex (NICORETTE) 2 MG gum Take 1 each (2 mg total) by mouth as needed for smoking cessation. (Patient not taking: Reported on 07/06/2016) 100 tablet 0 Not Taking at Unknown time  . traZODone (DESYREL) 100 MG tablet Take 1 tablet (100 mg total) by mouth at bedtime and may repeat dose one time if needed. (Patient not taking: Reported on 07/06/2016) 30 tablet 0 Not Taking at Unknown time    Patient Stressors: Financial difficulties Substance abuse  Patient Strengths: Ability for Estate manager/land agent for treatment/growth  Treatment Modalities: Medication Management, Group therapy, Case management,  1 to 1 session with clinician, Psychoeducation, Recreational therapy.   Physician Treatment Plan for Primary Diagnosis: Alcohol dependence with alcohol-induced mood disorder ([place]) Long Term Goal(s): Improvement in symptoms so as ready for discharge Improvement in symptoms so as ready for discharge   Short Term Goals: Ability to identify changes in lifestyle to reduce recurrence of condition will improve Ability to verbalize feelings will improve Ability to disclose and discuss suicidal ideas Ability to demonstrate self-control will improve Ability to identify and develop effective coping behaviors will improve Ability to maintain clinical measurements within normal limits will improve Compliance with prescribed  medications will improve Ability to identify triggers associated with substance abuse/mental health issues will improve Ability to identify changes in lifestyle to reduce recurrence of condition will improve Ability to verbalize feelings will improve Ability to disclose and discuss suicidal ideas Ability to demonstrate self-control will improve Ability to identify and develop  effective coping behaviors will improve Ability to maintain clinical measurements within normal limits will improve Compliance with prescribed medications will improve Ability to identify triggers associated with substance abuse/mental health issues will improve  Medication Management: Evaluate patient's response, side effects, and tolerance of medication regimen.  Therapeutic Interventions: 1 to 1 sessions, Unit Group sessions and Medication administration.  Evaluation of Outcomes: Progressing  Physician Treatment Plan for Secondary Diagnosis: Principal Problem:   Alcohol dependence with alcohol-induced mood disorder ([place]) Active Problems:   Major depressive disorder, recurrent severe without psychotic features ([place])  Long Term Goal(s): Improvement in symptoms so as ready for discharge Improvement in symptoms so as ready for discharge   Short Term Goals: Ability to identify changes in lifestyle to reduce recurrence of condition will improve Ability to verbalize feelings will improve Ability to disclose and discuss suicidal ideas Ability to demonstrate self-control will improve Ability to identify and develop effective coping behaviors will improve Ability to maintain clinical measurements within normal limits will improve Compliance with prescribed medications will improve Ability to identify triggers associated with substance abuse/mental health issues will improve Ability to identify changes in lifestyle to reduce recurrence of condition will improve Ability to verbalize feelings will improve Ability to disclose and discuss suicidal ideas Ability to demonstrate self-control will improve Ability to identify and develop effective coping behaviors will improve Ability to maintain clinical measurements within normal limits will improve Compliance with prescribed medications will improve Ability to identify triggers associated with substance abuse/mental health issues will improve      Medication Management: Evaluate patient's response, side effects, and tolerance of medication regimen.  Therapeutic Interventions: 1 to 1 sessions, Unit Group sessions and Medication administration.  Evaluation of Outcomes: Progressing   RN Treatment Plan for Primary Diagnosis: Alcohol dependence with alcohol-induced mood disorder ([place]) Long Term Goal(s): Knowledge of disease and therapeutic regimen to maintain health will improve  Short Term Goals: Ability to remain free from injury will improve, Ability to verbalize feelings will improve and Ability to disclose and discuss suicidal ideas  Medication Management: RN will administer medications as ordered by provider, will assess and evaluate patient's response and provide education to patient for prescribed medication. RN will report any adverse and/or side effects to prescribing provider.  Therapeutic Interventions: 1 on 1 counseling sessions, Psychoeducation, Medication administration, Evaluate responses to treatment, Monitor vital signs and CBGs as ordered, Perform/monitor CIWA, COWS, AIMS and Fall Risk screenings as ordered, Perform wound care treatments as ordered.  Evaluation of Outcomes: Progressing   LCSW Treatment Plan for Primary Diagnosis: Alcohol dependence with alcohol-induced mood disorder ([place]) Long Term Goal(s): Safe transition to appropriate next level of care at discharge, Engage patient in therapeutic group addressing interpersonal concerns.  Short Term Goals: Engage patient in aftercare planning with referrals and resources, Facilitate patient progression through stages of change regarding substance use diagnoses and concerns and Identify triggers associated with mental health/substance abuse issues  Therapeutic Interventions: Assess for all discharge needs, 1 to 1 time with Social worker, Explore available resources and support systems, Assess for adequacy in community support network, Educate family and significant  other(s) on suicide prevention, Complete Psychosocial Assessment, Interpersonal group therapy.  Evaluation of Outcomes:  Progressing   Progress in Treatment: Attending groups: Yes. Participating in groups: Yes. Taking medication as prescribed: Yes. Toleration medication: Yes. Family/Significant other contact made: SPE completed with pt; pt declined to consent to family contact.  Patient understands diagnosis: Yes. Discussing patient identified problems/goals with staff: Yes. Medical problems stabilized or resolved: Yes. Denies suicidal/homicidal ideation: Yes. Issues/concerns per patient self-inventory: No. Other: n/a   New problem(s) identified: No, Describe:  n/a  New Short Term/Long Term Goal(s): detox; medication stabilization; development of comprehensive mental wellness/sobriety plan.   Discharge Plan or Barriers: CSW assessing. Pt referred to [facility] and [facility]; [facility] for outpatient. Hoping to get accepted for inpatient treatment.   Reason for Continuation of Hospitalization: Depression Medication stabilization Suicidal ideation Withdrawal symptoms  Estimated Length of Stay: 3-5 days   Attendees: Patient: 07/09/2016 4:02 PM  Physician: Dr. Parke Poisson MD 07/09/2016 4:02 PM  Nursing: Melina Copa RN 07/09/2016 4:02 PM  RN Care Manager: Lars Pinks CM 07/09/2016 4:02 PM  Social Worker: Press photographer, LCSW 07/09/2016 4:02 PM  Recreational Therapist:X  07/09/2016 4:02 PM  Other: Lindell Spar NP 07/09/2016 4:02 PM  Other:  07/09/2016 4:02 PM  Other: 07/09/2016 4:02 PM    Scribe for Treatment Team: [place], LCSW 07/09/2016 4:02 PM

## 2016-07-09 NOTE — Progress Notes (Addendum)
Pt did not attend Karaoke group in the cafeteria. Pt encouraged to come, pt remained in his room.

## 2016-07-10 NOTE — Progress Notes (Signed)
Patient attended wrap-up group and said that the had a up and down day. His day was a 6 and he feels happy that he woke up this morning.

## 2016-07-10 NOTE — Progress Notes (Addendum)
  DATA ACTION RESPONSE  Objective- Pt. is visible in the dayroom, sitting quietly. Presents with an anxious/depressed affect and mood. Pt. was appropriate and assertive with interaction. Subjective- Denies having any SI//AVH at this time. Rates pain 7/10; Headache. Pt. states "I think I'm going to Barry Moore. My day has been alright". Continues to be cooperative and remain safe & pleasant on the unit.  1:1 interaction in private to establish rapport. Encouragement, education, & support given from staff. Meds. ordered and administered. PRN Tylenol  requested and will re-eval accordingly.   Safety maintained with Q 15 checks. Continues to follow treatment plan and will monitor closely. No additonal questions/concerns noted.

## 2016-07-10 NOTE — BHH Group Notes (Signed)
[place] Group Notes:  (Clinical Social Work)  07/10/2016  10:00-11:00AM  Summary of Progress/Problems:   Today's process group involved patients discussing their feelings related to being hospitalized, as well as benefits they see to being in the hospital.  Patients shared their feelings freely and provided support to each other.  Depression was a commonality and various patients told their specific stories.  Resources in the community to help at discharge were mentioned, such as PATH and ACTT.    The patient expressed a primary feeling about being hospitalized is "content" and he told the story of how he was recently discharged and relapsed when he found out a friend had died (calling this his best friend at a later mention).  He stated he realized that when he was last at [facility], he should have stayed until admitted to [facility], but he was impatient to not feel locked in anymore, so he left.  He is aware that his blood alcohol content was over 500 at admission, is not sure when it would reach a lethal point.  He spoke a lot during group and was supportive of others.  Type of Therapy:  Group Therapy - Process  Participation Level:  Active  Participation Quality:  Attentive, Sharing and Supportive  Affect:  Blunted  Cognitive:  Appropriate and Oriented  Insight:  Developing/Improving  Engagement in Therapy:  Engaged  Modes of Intervention:  Exploration, Discussion  Selmer Dominion, LCSW 07/10/2016, 11:00 AM

## 2016-07-10 NOTE — Progress Notes (Signed)
[facility] MD Progress Note  07/10/2016 11:34 AM Barry Moore  MRN:  [id number] Subjective:  Barry Moore reports he slept well last night with increase in gabapentin. He reports that his mood continues to be down and depressed, but he is tolerating the increase in Celexa. He reports he was previously on Celexa 40 mg, and would like to eventually get to that dose. He understands that the plan is again to proceed with [facility].  He has worries about relapsing on discharge, and wants to make sure he has good follow-up arranged. He has been participating on the milieu. No agitated behaviors. No other acute questions or concerns at this time.  Principal Problem: Alcohol dependence with alcohol-induced mood disorder ([place]) Diagnosis:   Patient Active Problem List   Diagnosis Date Noted  . Major depressive disorder, recurrent severe without psychotic features ([place]) [F33.2] 07/06/2016  . Alcohol-induced mood disorder ([place]) [F10.94] 06/15/2016  . Seizure ([place]) [R56.9] 05/26/2016  . Tobacco abuse [Z72.0] 05/26/2016  . Homeless [Z59.0] 05/26/2016  . Essential hypertension [I10] 05/26/2016  . MDD (major depressive disorder), recurrent severe, without psychosis ([place]) [F33.2] 03/06/2016  . Alcohol dependence with alcohol-induced mood disorder ([place]) [F10.24] 02/14/2016  . Severe alcohol withdrawal without perceptual disturbances without complication ([place]) [N02.725] 02/14/2016  . Alcoholic hepatitis without ascites [K70.10] 02/14/2016  . Thrombocytopenia ([place]) [D69.6] 02/14/2016   Total Time spent with patient: 20 minutes  Past Psychiatric History: See intake H&P for full details. Reviewed, with no updates at this time.   Past Medical History:  Past Medical History:  Diagnosis Date  . Alcoholic hepatitis   . Depression   . ETOH abuse   . Hypertension   . Seizures ([place])     Past Surgical History:  Procedure Laterality Date  . HERNIA REPAIR    . LEG SURGERY     Family History:  Family History   Problem Relation Age of Onset  . Diabetes Mother   . Alcoholism Mother   . Emphysema Father   . Lung cancer Father   . Alcoholism Father    Family Psychiatric  History: See intake H&P for full details. Reviewed, with no updates at this time.  Social History:  History  Alcohol Use  . Yes    Comment: 5th liquor and one Forty daily       History  Drug Use No    Social History   Social History  . Marital status: Divorced    Spouse name: N/A  . Number of children: N/A  . Years of education: N/A   Social History Main Topics  . Smoking status: Current Every Day Smoker    Packs/day: 2.00    Types: Cigarettes  . Smokeless tobacco: Never Used  . Alcohol use Yes     Comment: 5th liquor and one Forty daily    . Drug use: No  . Sexual activity: Not Currently   Other Topics Concern  . None   Social History Narrative  . None   Additional Social History:                         Sleep: Fair  Appetite:  Fair  Current Medications: Current Facility-Administered Medications  Medication Dose Route Frequency Provider Last Rate Last Dose  . acetaminophen (TYLENOL) tablet 650 mg  650 mg Oral Q6H PRN Patrecia Pour, NP   650 mg at 07/10/16 1130  . amLODipine (NORVASC) tablet 5 mg  5 mg Oral Daily Asa Saunas  Lord, NP   5 mg at 07/10/16 0927  . citalopram (CELEXA) tablet 20 mg  20 mg Oral Daily Aundra Dubin, MD   20 mg at 07/10/16 2330  . gabapentin (NEURONTIN) capsule 300 mg  300 mg Oral QHS Aundra Dubin, MD   300 mg at 07/09/16 2208  . ibuprofen (ADVIL,MOTRIN) tablet 600 mg  600 mg Oral Q6H PRN Jenne Campus, MD   600 mg at 07/09/16 0924  . magnesium hydroxide (MILK OF MAGNESIA) suspension 30 mL  30 mL Oral Daily PRN Patrecia Pour, NP      . multivitamin with minerals tablet 1 tablet  1 tablet Oral Daily Jenne Campus, MD   1 tablet at 07/10/16 0762  . thiamine (VITAMIN B-1) tablet 100 mg  100 mg Oral Daily Jenne Campus, MD   100 mg at 07/10/16  0926  . traZODone (DESYREL) tablet 100 mg  100 mg Oral QHS,MR X 1 Rozetta Nunnery, NP   100 mg at 07/09/16 2208    Lab Results: No results found for this or any previous visit (from the past 48 hour(s)).  Blood Alcohol level:  Lab Results  Component Value Date   ETH 368 (HH) 07/05/2016   ETH 114 (H) 26/33/3545    Metabolic Disorder Labs: Lab Results  Component Value Date   HGBA1C 5.2 03/07/2016   MPG 103 03/07/2016   Lab Results  Component Value Date   PROLACTIN 23.2 (H) 03/07/2016   Lab Results  Component Value Date   CHOL 215 (H) 03/07/2016   TRIG 82 03/07/2016   HDL 105 03/07/2016   CHOLHDL 2.0 03/07/2016   VLDL 16 03/07/2016   LDLCALC 94 03/07/2016    Physical Findings: AIMS: Facial and Oral Movements Muscles of Facial Expression: None, normal Lips and Perioral Area: None, normal Jaw: None, normal Tongue: None, normal,Extremity Movements Upper (arms, wrists, hands, fingers): None, normal Lower (legs, knees, ankles, toes): None, normal, Trunk Movements Neck, shoulders, hips: None, normal, Overall Severity Severity of abnormal movements (highest score from questions above): None, normal Incapacitation due to abnormal movements: None, normal Patient's awareness of abnormal movements (rate only patient's report): No Awareness, Dental Status Current problems with teeth and/or dentures?: No Does patient usually wear dentures?: No  CIWA:  CIWA-Ar Total: 4 COWS:     Musculoskeletal: Strength & Muscle Tone: within normal limits Gait & Station: normal Patient leans: N/A  Psychiatric Specialty Exam: Physical Exam  ROS  Blood pressure (!) 135/91, pulse 92, temperature 98.1 F (36.7 C), temperature source Oral, resp. rate 20, height 5\' 8"  (1.727 m), weight 81.6 kg (180 lb), SpO2 100 %.Body mass index is 27.37 kg/m.  General Appearance: Casual, Fairly Groomed and Large beard  Eye Contact:  Good  Speech:  Clear and Coherent  Volume:  Normal  Mood:  Dysphoric   Affect:  Congruent  Thought Process:  Goal Directed  Orientation:  Full (Time, Place, and Person)  Thought Content:  Logical  Suicidal Thoughts:  Yes.  without intent/plan  Homicidal Thoughts:  No  Memory:  Immediate;   Good  Judgement:  Good  Insight:  Good and Fair  Psychomotor Activity:  Normal  Concentration:  Concentration: Fair  Recall:  NA  Fund of Knowledge:  Good  Language:  Good  Akathisia:  Negative  Handed:  Right  AIMS (if indicated):     Assets:  Communication Skills Desire for Improvement  ADL's:  Intact  Cognition:  WNL  Sleep:  Number of Hours: 6.75    Treatment Plan Summary: Shakir Petrosino is a 51 year old male with alcohol use disorder and depression. Continuing to titrate medications and prepare for dispo.  No behavioral issues on unit during admission.  No acute changes today.  Celexa increased to 20 mg Gabapentin consolidated to 300 mg nightly Trazodone 100 mg, with additional when necessary if needed Continue to monitor for any left over withdrawal symptoms No acute medical issues, continue antihypertensive with amlodipine Tylenol and ibuprofen when necessary for arthritis pain Continue to coordinate with social work for transition to [employer], MD 07/10/2016, 11:34 AM

## 2016-07-10 NOTE — BHH Group Notes (Signed)
[place] Group Notes:  (Nursing/MHT/Case Management/Adjunct)  Date:  07/10/2016  Time:  2:06 PM  Type of Therapy:  Nurse Education  Participation Level:  Active  Participation Quality:  Appropriate  Affect:  Appropriate  Cognitive:  Appropriate  Insight:  Appropriate  Engagement in Group:  Engaged  Modes of Intervention:  Problem-solving  Summary of Progress/Problems: Pt attended CBT group.   Barry Moore 07/10/2016, 2:06 PM

## 2016-07-10 NOTE — Plan of Care (Addendum)
Problem: Safety: Goal: Periods of time without injury will increase Outcome: Progressing Pt. denies SI/AVH at this time, endorses HI, remains a high fall risk (hx of seizures), Q 15 checks in effect.

## 2016-07-10 NOTE — Progress Notes (Signed)
Data. Patient denies SI/HI/AVH. Patient interacting well with staff and other patients. Affect is flat. But does brighten with interaction. Patient was able to give insightful answers during group.  Action. Emotional support and encouragement offered. Education provided on medication, indications and side effect. Q 15 minute checks done for safety. Response. Safety on the unit maintained through 15 minute checks.  Medications taken as prescribed. Attended groups. Remained calm and appropriate through out shift.

## 2016-07-11 NOTE — Progress Notes (Signed)
Adult Psychoeducational Group Note  Date:  07/11/2016 Time:  8:41 PM  Group Topic/Focus:  Wrap-Up Group:   The focus of this group is to help patients review their daily goal of treatment and discuss progress on daily workbooks.  Participation Level:  Active  Participation Quality:  Appropriate  Affect:  Appropriate  Cognitive:  Appropriate  Insight: Appropriate  Engagement in Group:  Engaged  Modes of Intervention:  Socialization and Support  Additional Comments:  Patient attended and participated in group tonight. He reports that his day went well. He did not do anything of significant he just felt better today.  He went for his meals and attended his groups.  Salley Scarlet [facility] 07/11/2016, 8:41 PM

## 2016-07-11 NOTE — Progress Notes (Signed)
[facility] MD Progress Note  07/11/2016 8:47 AM Barry Moore  MRN:  [id number] Subjective:  Barry Moore slept well overnight. No behavioral issues on unit. Continues with worried and depressed mood, but is hopeful the medications will take effect. He understands we are working on disposition at this time. Has no questions or concerns presently.  Principal Problem: Alcohol dependence with alcohol-induced mood disorder (Barry Moore) Diagnosis:   Patient Active Problem List   Diagnosis Date Noted  . Major depressive disorder, recurrent severe without psychotic features ([place]) [F33.2] 07/06/2016  . Alcohol-induced mood disorder ([place]) [F10.94] 06/15/2016  . Seizure ([place]) [R56.9] 05/26/2016  . Tobacco abuse [Z72.0] 05/26/2016  . Homeless [Z59.0] 05/26/2016  . Essential hypertension [I10] 05/26/2016  . MDD (major depressive disorder), recurrent severe, without psychosis ([place]) [F33.2] 03/06/2016  . Alcohol dependence with alcohol-induced mood disorder ([place]) [F10.24] 02/14/2016  . Severe alcohol withdrawal without perceptual disturbances without complication ([place]) [G18.299] 02/14/2016  . Alcoholic hepatitis without ascites [K70.10] 02/14/2016  . Thrombocytopenia ([place]) [D69.6] 02/14/2016   Total Time spent with patient: 20 minutes  Past Psychiatric History: See intake H&P for full details. Reviewed, with no updates at this time.   Past Medical History:  Past Medical History:  Diagnosis Date  . Alcoholic hepatitis   . Depression   . ETOH abuse   . Hypertension   . Seizures ([place])     Past Surgical History:  Procedure Laterality Date  . HERNIA REPAIR    . LEG SURGERY     Family History:  Family History  Problem Relation Age of Onset  . Diabetes Mother   . Alcoholism Mother   . Emphysema Father   . Lung cancer Father   . Alcoholism Father    Family Psychiatric  History: See intake H&P for full details. Reviewed, with no updates at this time.  Social History:  History  Alcohol Use  . Yes     Comment: 5th liquor and one Forty daily       History  Drug Use No    Social History   Social History  . Marital status: Divorced    Spouse name: N/A  . Number of children: N/A  . Years of education: N/A   Social History Main Topics  . Smoking status: Current Every Day Smoker    Packs/day: 2.00    Types: Cigarettes  . Smokeless tobacco: Never Used  . Alcohol use Yes     Comment: 5th liquor and one Forty daily    . Drug use: No  . Sexual activity: Not Currently   Other Topics Concern  . None   Social History Narrative  . None   Additional Social History:                         Sleep: Fair  Appetite:  Fair  Current Medications: Current Facility-Administered Medications  Medication Dose Route Frequency Provider Last Rate Last Dose  . acetaminophen (TYLENOL) tablet 650 mg  650 mg Oral Q6H PRN Patrecia Pour, NP   650 mg at 07/10/16 2155  . amLODipine (NORVASC) tablet 5 mg  5 mg Oral Daily Patrecia Pour, NP   5 mg at 07/11/16 0750  . citalopram (CELEXA) tablet 20 mg  20 mg Oral Daily Aundra Dubin, MD   20 mg at 07/11/16 0750  . gabapentin (NEURONTIN) capsule 300 mg  300 mg Oral QHS Aundra Dubin, MD   300 mg at 07/10/16 2154  .  ibuprofen (ADVIL,MOTRIN) tablet 600 mg  600 mg Oral Q6H PRN Jenne Campus, MD   600 mg at 07/09/16 0924  . magnesium hydroxide (MILK OF MAGNESIA) suspension 30 mL  30 mL Oral Daily PRN Patrecia Pour, NP      . multivitamin with minerals tablet 1 tablet  1 tablet Oral Daily Jenne Campus, MD   1 tablet at 07/11/16 0750  . thiamine (VITAMIN B-1) tablet 100 mg  100 mg Oral Daily Jenne Campus, MD   100 mg at 07/11/16 0750  . traZODone (DESYREL) tablet 100 mg  100 mg Oral QHS,MR X 1 Rozetta Nunnery, NP   100 mg at 07/10/16 2154    Lab Results: No results found for this or any previous visit (from the past 48 hour(s)).  Blood Alcohol level:  Lab Results  Component Value Date   ETH 368 (HH) 07/05/2016   ETH  114 (H) 53/97/6734    Metabolic Disorder Labs: Lab Results  Component Value Date   HGBA1C 5.2 03/07/2016   MPG 103 03/07/2016   Lab Results  Component Value Date   PROLACTIN 23.2 (H) 03/07/2016   Lab Results  Component Value Date   CHOL 215 (H) 03/07/2016   TRIG 82 03/07/2016   HDL 105 03/07/2016   CHOLHDL 2.0 03/07/2016   VLDL 16 03/07/2016   LDLCALC 94 03/07/2016    Physical Findings: AIMS: Facial and Oral Movements Muscles of Facial Expression: None, normal Lips and Perioral Area: None, normal Jaw: None, normal Tongue: None, normal,Extremity Movements Upper (arms, wrists, hands, fingers): None, normal Lower (legs, knees, ankles, toes): None, normal, Trunk Movements Neck, shoulders, hips: None, normal, Overall Severity Severity of abnormal movements (highest score from questions above): None, normal Incapacitation due to abnormal movements: None, normal Patient's awareness of abnormal movements (rate only patient's report): No Awareness, Dental Status Current problems with teeth and/or dentures?: No Does patient usually wear dentures?: No  CIWA:  CIWA-Ar Total: 0 COWS:     Musculoskeletal: Strength & Muscle Tone: within normal limits Gait & Station: normal Patient leans: N/A  Psychiatric Specialty Exam: Physical Exam  ROS  Blood pressure 131/85, pulse 84, temperature 98.1 F (36.7 C), temperature source Oral, resp. rate 20, height 5\' 8"  (1.727 m), weight 81.6 kg (180 lb), SpO2 100 %.Body mass index is 27.37 kg/m.  General Appearance: Casual, Fairly Groomed and Large beard  Eye Contact:  Good  Speech:  Clear and Coherent  Volume:  Normal  Mood:  Euthymic  Affect:  Congruent  Thought Process:  Goal Directed  Orientation:  Full (Time, Place, and Person)  Thought Content:  Logical  Suicidal Thoughts:  Yes.  without intent/plan  Homicidal Thoughts:  No  Memory:  Immediate;   Good  Judgement:  Good  Insight:  Good and Fair  Psychomotor Activity:  Normal   Concentration:  Concentration: Fair  Recall:  NA  Fund of Knowledge:  Good  Language:  Good  Akathisia:  Negative  Handed:  Right  AIMS (if indicated):     Assets:  Communication Skills Desire for Improvement  ADL's:  Intact  Cognition:  WNL  Sleep:  Number of Hours: 6.75    Treatment Plan Summary: Rashaan Wyles is a 51 year old male with alcohol use disorder and depression. Continuing to titrate medications and prepare for dispo.  No behavioral issues on unit during admission. Working towards disposition, no changes today.  Celexa 20 mg for mood, depression Gabapentin 300 mg nightly Trazodone 100 mg,  with additional when necessary if needed No acute medical issues, continue antihypertensive with amlodipine Tylenol and ibuprofen when necessary for arthritis pain Continue to coordinate with social work for transition to [employer] (patient preference for sobriety treatment)  Aundra Dubin, MD 07/11/2016, 8:47 AM

## 2016-07-11 NOTE — BHH Group Notes (Signed)
[place] [facility] Group Notes:  (Clinical Social Work)  07/11/2016   10:00-11:00AM  Summary of Progress/Problems:  The main focus of today's process group was to listen to a variety of genres of music and to identify that different types of music provoke different responses.  The patient then was able to identify personally what was soothing for them, as well as energizing, as well as how patient can personally use this knowledge in sleep habits, with depression, and with other symptoms.  The patient expressed at the beginning of group the overall feeling of confused and lost at a level of 7 on a 1-10 scale.  At the end of group, he stated he no longer felt confused, but was content.  He interacted quite a bit throughout group.  Type of Therapy:  Music Therapy   Participation Level:  Active  Participation Quality:  Attentive and Sharing  Affect:  Blunted  Cognitive:  Oriented  Insight:  Engaged  Engagement in Therapy:  Engaged  Modes of Intervention:   Activity, Exploration  Selmer Dominion, LCSW 07/11/2016

## 2016-07-11 NOTE — BHH Group Notes (Signed)
[place] Group Notes:  (Nursing/MHT/Case Management/Adjunct)  Date:  07/11/2016  Time:  1:15  Type of Therapy:  Nurse Education  Participation Level:  Active  Participation Quality:  Appropriate, Sharing and Supportive  Affect:  Appropriate  Cognitive:  Alert and Appropriate  Insight:  Appropriate and Good  Engagement in Group:  Engaged and Supportive  Modes of Intervention:  Clarification, Discussion, Education and Problem-solving  Summary of Progress/Problems::CBT Group: changing negative thoughts to positive.  [name] 07/11/2016, 2:25 PM

## 2016-07-11 NOTE — Progress Notes (Addendum)
Patient ID: Barry Moore, male   DOB: Sep 21, 1965, 51 y.o.   MRN: [id number] D:Affect is appropriate to mood.He has been attending and participating in all groups and activities today.Pt rates both his depression and anxiety as a 6 today. A:Support and encouragement offered. R:Receptive. No complaints of pain or problems at this time.

## 2016-07-12 MED ORDER — AMLODIPINE BESYLATE 5 MG PO TABS: 5.0000 mg | ORAL_TABLET | Freq: Every day | ORAL | 0 refills | Status: DC

## 2016-07-12 MED ORDER — TRAZODONE HCL 100 MG PO TABS: 100.0000 mg | ORAL_TABLET | Freq: Every evening | ORAL | 0 refills | Status: DC | PRN

## 2016-07-12 MED ORDER — GABAPENTIN 300 MG PO CAPS: 300.0000 mg | ORAL_CAPSULE | Freq: Every day | ORAL | 0 refills | Status: DC

## 2016-07-12 MED ORDER — CITALOPRAM HYDROBROMIDE 20 MG PO TABS: 20.0000 mg | ORAL_TABLET | Freq: Every day | ORAL | 0 refills | Status: DC

## 2016-07-12 NOTE — Progress Notes (Signed)
Discharge note: Pt received both written and verbal discharge instructions. Pt verbalized understanding of discharge instructions. Pt agreed to f/u appt and med regimen. Pt received SRA, AVS, transitional record, prescriptions and sample meds. Pt gathered belongings from room and locker. Pt safely discharged to the lobby and picked up by [facility] driver. Writer handed off packet containing meds to [place].

## 2016-07-12 NOTE — Progress Notes (Signed)
  [facility] Adult Case Management Discharge Plan :  Will you be returning to the same living situation after discharge:  No.Pt accepted to [facility].  At discharge, do you have transportation home?: Yes,  [facility] drive will pick him up at 1pm Do you have the ability to pay for your medications: Yes,  mental health  Release of information consent forms completed and submitted to medical records by CSW.  Patient to Follow up at: Follow-up Information    [facility] Follow up.   Specialty:  Behavioral Health Why:  Walk in between 8am-9am to be assessed for hospital/treatment facilty follow-up, medication management, and counseling services. Please go within 7 days of hospital or treatment discharge. Thank you.  Contact information: [place] [address] [phone]        [facility] Follow up.   Why:  You have been accepted to [facility] for today at 1pm. Please make sure you have 21 day supply of medications prior to discharge. [facility] driver will pick you up and transport you directly to facility. Thank you.  Contact information: [address] [place], [place] [address] Phone: [phone] Fax: [phone]          Next level of care provider has access to [facility] and Suicide Prevention discussed: Yes,  SPE completed with pt; pt declined to consent to family contact. SPI pamphlet and Mobile Crisis information provided.  Have you used any form of tobacco in the last 30 days? (Cigarettes, Smokeless Tobacco, Cigars, and/or Pipes): Yes  Has patient been referred to the Quitline?: Patient refused referral  Patient has been referred for addiction treatment: Yes  Jesusmanuel Erbes N Smart LCSW 07/12/2016, 10:14 AM

## 2016-07-12 NOTE — BHH Suicide Risk Assessment (Signed)
[facility] Discharge Suicide Risk Assessment   Principal Problem: Alcohol dependence with alcohol-induced mood disorder [facility]) Discharge Diagnoses:  Patient Active Problem List   Diagnosis Date Noted  . Major depressive disorder, recurrent severe without psychotic features ([place]) [F33.2] 07/06/2016  . Alcohol-induced mood disorder ([place]) [F10.94] 06/15/2016  . Seizure ([place]) [R56.9] 05/26/2016  . Tobacco abuse [Z72.0] 05/26/2016  . Homeless [Z59.0] 05/26/2016  . Essential hypertension [I10] 05/26/2016  . MDD (major depressive disorder), recurrent severe, without psychosis ([place]) [F33.2] 03/06/2016  . Alcohol dependence with alcohol-induced mood disorder ([place]) [F10.24] 02/14/2016  . Severe alcohol withdrawal without perceptual disturbances without complication ([place]) [I34.742] 02/14/2016  . Alcoholic hepatitis without ascites [K70.10] 02/14/2016  . Thrombocytopenia ([place]) [D69.6] 02/14/2016    Total Time spent with patient: 30 minutes  Musculoskeletal: Strength & Muscle Tone: within normal limits Gait & Station: normal Patient leans: N/A  Psychiatric Specialty Exam: ROS denies headache, no chest pain, no shortness of breath   Blood pressure 120/87, pulse 100, temperature 98.6 F (37 C), temperature source Oral, resp. rate 20, height 5\' 8"  (1.727 m), weight 81.6 kg (180 lb), SpO2 100 %.Body mass index is 27.37 kg/m.  General Appearance: Well Groomed  Eye Contact::  Good  Speech:  Normal Rate409  Volume:  Normal  Mood:  mood improved, and now euthymic   Affect:  Appropriate and Full Range  Thought Process:  Linear  Orientation:  Full (Time, Place, and Person)  Thought Content:  no hallucinations, no delusions, not internally preoccupied   Suicidal Thoughts:  No denies any suicidal or self injurious ideations, no violent or homicidal ideations   Homicidal Thoughts:  No  Memory:  recent and remote grossly intact   Judgement:  Other:  improved   Insight:  improved   Psychomotor Activity:   Normal  Concentration:  Good  Recall:  Good  Fund of Knowledge:Good  Language: Good  Akathisia:  Negative  Handed:  Right  AIMS (if indicated):     Assets:  Communication Skills Desire for Improvement  Sleep:  Number of Hours: 6.75  Cognition: WNL  ADL's:  Intact   Mental Status Per Nursing Assessment::   On Admission:     Demographic Factors:  51 year old male, currently homeless, unemployed  Loss Factors: Relapse, homelessness, recent death of a friend   Historical Factors: History of alcohol dependence, history of depression, prior psychiatric admissions, history of suicide attempt   Risk Reduction Factors:   Positive coping skills or problem solving skills  Continued Clinical Symptoms:  At this time patient is improved compared to admission - presents alert, attentive, well related, mood improved, and at this time presents euthymic, full range of affect, no thought disorder, no suicidal or self injurious ideations, no hallucinations, no delusions, not internally preoccupied . Denies medication side effects. No ongoing symptoms of alcohol WDL at this time Behavior on unit in good control  Cognitive Features That Contribute To Risk:  No gross cognitive deficits noted upon discharge. Is alert , attentive, and oriented x 3   Suicide Risk:  Mild:  Suicidal ideation of limited frequency, intensity, duration, and specificity.  There are no identifiable plans, no associated intent, mild dysphoria and related symptoms, good self-control (both objective and subjective assessment), few other risk factors, and identifiable protective factors, including available and accessible social support.  Follow-up Information    [facility] Follow up.   Specialty:  Behavioral Health Why:  Walk in between 8am-9am to be assessed for hospital/treatment facilty follow-up, medication management,  and counseling services. Please go within 7 days of hospital or treatment discharge. Thank you.  Contact  information: [place] [address] [phone]        [facility] Follow up.   Why:  You have been accepted to [facility] for today at 1pm. Please make sure you have 21 day supply of medications prior to discharge. [facility] driver will pick you up and transport you directly to facility. Thank you.  Contact information: [address]. [place], [place] [address] Phone: [phone] Fax: [phone]          Plan Of Care/Follow-up recommendations:  Activity:  as tolerated  Diet:  Heart Healthy Tests:  NA Other:  See below  Patient is going to [facility] rehab for further treatment. Leaving unit in good spirits.   Jenne Campus, MD 07/12/2016, 10:19 AM

## 2016-07-12 NOTE — Discharge Summary (Signed)
Physician Discharge Summary Note  Patient:  Barry Moore is an 51 y.o., male MRN:  [id number] DOB:  1965-10-04 Patient phone:  [phone] (home)  Patient address:   [place] [address],  Total Time spent with patient: 30 minutes  Date of Admission:  07/06/2016 Date of Discharge: 07/12/2016  Reason for Admission:  Alcohol intoxication  Principal Problem: Alcohol dependence with alcohol-induced mood disorder [facility]) Discharge Diagnoses: Patient Active Problem List   Diagnosis Date Noted  . Major depressive disorder, recurrent severe without psychotic features ([place]) [F33.2] 07/06/2016    Priority: High  . Alcohol-induced mood disorder ([place]) [F10.94] 06/15/2016  . Seizure ([place]) [R56.9] 05/26/2016  . Tobacco abuse [Z72.0] 05/26/2016  . Homeless [Z59.0] 05/26/2016  . Essential hypertension [I10] 05/26/2016  . MDD (major depressive disorder), recurrent severe, without psychosis ([place]) [F33.2] 03/06/2016  . Alcohol dependence with alcohol-induced mood disorder ([place]) [F10.24] 02/14/2016  . Severe alcohol withdrawal without perceptual disturbances without complication ([place]) [M84.132] 02/14/2016  . Alcoholic hepatitis without ascites [K70.10] 02/14/2016  . Thrombocytopenia ([place]) [D69.6] 02/14/2016    Past Psychiatric History: see HPI  Past Medical History:  Past Medical History:  Diagnosis Date  . Alcoholic hepatitis   . Depression   . ETOH abuse   . Hypertension   . Seizures ([place])     Past Surgical History:  Procedure Laterality Date  . HERNIA REPAIR    . LEG SURGERY     Family History:  Family History  Problem Relation Age of Onset  . Diabetes Mother   . Alcoholism Mother   . Emphysema Father   . Lung cancer Father   . Alcoholism Father    Family Psychiatric  History: see HPI Social History:  History  Alcohol Use  . Yes    Comment: 5th liquor and one Forty daily       History  Drug Use No    Social History   Social History  . Marital status:  Divorced    Spouse name: N/A  . Number of children: N/A  . Years of education: N/A   Social History Main Topics  . Smoking status: Current Every Day Smoker    Packs/day: 2.00    Types: Cigarettes  . Smokeless tobacco: Never Used  . Alcohol use Yes     Comment: 5th liquor and one Forty daily    . Drug use: No  . Sexual activity: Not Currently   Other Topics Concern  . None   Social History Narrative  . None    Hospital Course:  Barry Moore, 51 yo male patient, homeless, single, presented to ED intoxicated, stating that he "wanted to drink himself to death."  His BAL 368.  He was recently discharged from [facility] with date on 06/24/15.  He stated that he didn't do well after he left [facility].    Barry Moore was admitted for Alcohol dependence with alcohol-induced mood disorder ([place]) and crisis management.  Patient was treated with medications with their indications listed below in detail under Medication List.  Medical problems were identified and treated as needed.  Home medications were restarted as appropriate.  Improvement was monitored by observation and Barry Moore daily report of symptom reduction.  Emotional and mental status was monitored by daily self inventory reports completed by Barry Moore and clinical staff.  Patient reported continued improvement, denied any new concerns.  Patient had been compliant on medications and denied side effects.  Support and encouragement was provided.  Barry Moore was evaluated by the treatment team for stability and plans for continued recovery upon discharge.  Patient was offered further treatment options upon discharge including Residential, Intensive Outpatient and Outpatient treatment. Patient will follow up with agency listed below for medication management and counseling.  Encouraged patient to maintain satisfactory support network and home environment.  Advised to adhere to medication compliance and outpatient treatment follow  up.  Prescriptions provided.       Barry Moore motivation was an integral factor for scheduling further treatment.  Employment, transportation, bed availability, health status, family support, and any pending legal issues were also considered during patient's hospital stay.  Upon completion of this admission the patient was both mentally and medically stable for discharge denying suicidal/homicidal ideation, auditory/visual/tactile hallucinations, delusional thoughts and paranoia.      Physical Findings: AIMS: Facial and Oral Movements Muscles of Facial Expression: None, normal Lips and Perioral Area: None, normal Jaw: None, normal Tongue: None, normal,Extremity Movements Upper (arms, wrists, hands, fingers): None, normal Lower (legs, knees, ankles, toes): None, normal, Trunk Movements Neck, shoulders, hips: None, normal, Overall Severity Severity of abnormal movements (highest score from questions above): None, normal Incapacitation due to abnormal movements: None, normal Patient's awareness of abnormal movements (rate only patient's report): No Awareness, Dental Status Current problems with teeth and/or dentures?: No Does patient usually wear dentures?: No  CIWA:  CIWA-Ar Total: 0 COWS:     Musculoskeletal: Strength & Muscle Tone: within normal limits Gait & Station: normal Patient leans: N/A  Psychiatric Specialty Exam:  See MD SRA Physical Exam  Nursing note and vitals reviewed. Psychiatric: He has a normal mood and affect. His speech is normal and behavior is normal. Thought content normal.    ROS  Blood pressure 120/87, pulse 100, temperature 98.6 F (37 C), temperature source Oral, resp. rate 20, height 5\' 8"  (1.727 m), weight 81.6 kg (180 lb), SpO2 100 %.Body mass index is 27.37 kg/m.   Have you used any form of tobacco in the last 30 days? (Cigarettes, Smokeless Tobacco, Cigars, and/or Pipes): Yes  Has this patient used any form of tobacco in the last 30 days?  (Cigarettes, Smokeless Tobacco, Cigars, and/or Pipes) Yes, NA  Blood Alcohol level:  Lab Results  Component Value Date   ETH 368 (HH) 07/05/2016   ETH 114 (H) 70/48/8891    Metabolic Disorder Labs:  Lab Results  Component Value Date   HGBA1C 5.2 03/07/2016   MPG 103 03/07/2016   Lab Results  Component Value Date   PROLACTIN 23.2 (H) 03/07/2016   Lab Results  Component Value Date   CHOL 215 (H) 03/07/2016   TRIG 82 03/07/2016   HDL 105 03/07/2016   CHOLHDL 2.0 03/07/2016   VLDL 16 03/07/2016   LDLCALC 94 03/07/2016    See Psychiatric Specialty Exam and Suicide Risk Assessment completed by Attending Physician prior to discharge.  Discharge destination:  Home  Is patient on multiple antipsychotic therapies at discharge:  No   Has Patient had three or more failed trials of antipsychotic monotherapy by history:  No  Recommended Plan for Multiple Antipsychotic Therapies: NA   Allergies as of 07/12/2016      Reactions   Aspirin Nausea And Vomiting      Medication List    STOP taking these medications   acamprosate 333 MG tablet Commonly known as:  CAMPRAL   nicotine polacrilex 2 MG gum Commonly known as:  NICORETTE     TAKE these medications  Indication  amLODipine 5 MG tablet Commonly known as:  NORVASC Take 1 tablet (5 mg total) by mouth daily. Start taking on:  07/13/2016  Indication:  High Blood Pressure Disorder   citalopram 20 MG tablet Commonly known as:  CELEXA Take 1 tablet (20 mg total) by mouth daily. Start taking on:  07/13/2016  Indication:  Depression   gabapentin 300 MG capsule Commonly known as:  NEURONTIN Take 1 capsule (300 mg total) by mouth at bedtime. What changed:  medication strength  how much to take  when to take this  Indication:  Agitation   traZODone 100 MG tablet Commonly known as:  DESYREL Take 1 tablet (100 mg total) by mouth at bedtime and may repeat dose one time if needed.  Indication:  Trouble Sleeping       Follow-up Information    [facility] Follow up.   Specialty:  Behavioral Health Why:  Walk in between 8am-9am to be assessed for hospital/treatment facilty follow-up, medication management, and counseling services. Please go within 7 days of hospital or treatment discharge. Thank you.  Contact information: [place] [address] [phone]        [facility] Follow up.   Why:  You have been accepted to [facility] for today at 1pm. Please make sure you have 21 day supply of medications prior to discharge. [facility] driver will pick you up and transport you directly to facility. Thank you.  Contact information: [address] [place], [place] [address] Phone: [phone] Fax: [phone]          Follow-up recommendations:  Activity:  as tol Diet:  as tol  Comments:  1.  Take all your medications as prescribed.   2.  Report any adverse side effects to outpatient provider. 3.  Patient instructed to not use alcohol or illegal drugs while on prescription medicines. 4.  In the event of worsening symptoms, instructed patient to call 911, the crisis hotline or go to nearest emergency room for evaluation of symptoms.  Signed: Janett Labella, NP [facility] 07/12/2016, 1:57 PM   Patient seen, Suicide Assessment Completed.  Disposition Plan Reviewed

## 2016-07-12 NOTE — Progress Notes (Signed)
D   Pt is pleasant on approach and cooperative    He asked if he would be discharged tomorrow and said he thinks he is ready but he wants to go for long term rehab A   Verbal support given   Medications administered and effectiveness monitored    Q 15 min checks R  Pt is safe at present

## 2016-07-12 NOTE — Tx Team (Signed)
Interdisciplinary Treatment and Diagnostic Plan Update  07/12/2016 Time of Session: 0930 Barry Moore MRN: [id number]  Principal Diagnosis: Alcohol dependence with alcohol-induced mood disorder ([place])  Secondary Diagnoses: Principal Problem:   Alcohol dependence with alcohol-induced mood disorder ([place]) Active Problems:   Major depressive disorder, recurrent severe without psychotic features ([place])   Current Medications:  Current Facility-Administered Medications  Medication Dose Route Frequency Provider Last Rate Last Dose  . acetaminophen (TYLENOL) tablet 650 mg  650 mg Oral Q6H PRN Patrecia Pour, NP   650 mg at 07/10/16 2155  . amLODipine (NORVASC) tablet 5 mg  5 mg Oral Daily Patrecia Pour, NP   5 mg at 07/12/16 0831  . citalopram (CELEXA) tablet 20 mg  20 mg Oral Daily Aundra Dubin, MD   20 mg at 07/12/16 0831  . gabapentin (NEURONTIN) capsule 300 mg  300 mg Oral QHS Aundra Dubin, MD   300 mg at 07/11/16 2203  . ibuprofen (ADVIL,MOTRIN) tablet 600 mg  600 mg Oral Q6H PRN Jenne Campus, MD   600 mg at 07/09/16 0924  . magnesium hydroxide (MILK OF MAGNESIA) suspension 30 mL  30 mL Oral Daily PRN Patrecia Pour, NP      . multivitamin with minerals tablet 1 tablet  1 tablet Oral Daily Jenne Campus, MD   1 tablet at 07/12/16 0831  . thiamine (VITAMIN B-1) tablet 100 mg  100 mg Oral Daily Jenne Campus, MD   100 mg at 07/12/16 0831  . traZODone (DESYREL) tablet 100 mg  100 mg Oral QHS,MR X 1 Rozetta Nunnery, NP   100 mg at 07/11/16 2203   PTA Medications: Prescriptions Prior to Admission  Medication Sig Dispense Refill Last Dose  . acamprosate (CAMPRAL) 333 MG tablet Take 2 tablets (666 mg total) by mouth 3 (three) times daily with meals. (Patient not taking: Reported on 07/06/2016) 180 tablet 0 Not Taking at Unknown time  . citalopram (CELEXA) 20 MG tablet Take 1 tablet (20 mg total) by mouth daily. (Patient not taking: Reported on 07/06/2016) 30 tablet 0 Not  Taking at Unknown time  . gabapentin (NEURONTIN) 100 MG capsule Take 2 capsules (200 mg total) by mouth 3 (three) times daily. (Patient not taking: Reported on 07/06/2016) 180 capsule 0 Not Taking at Unknown time  . nicotine polacrilex (NICORETTE) 2 MG gum Take 1 each (2 mg total) by mouth as needed for smoking cessation. (Patient not taking: Reported on 07/06/2016) 100 tablet 0 Not Taking at Unknown time  . traZODone (DESYREL) 100 MG tablet Take 1 tablet (100 mg total) by mouth at bedtime and may repeat dose one time if needed. (Patient not taking: Reported on 07/06/2016) 30 tablet 0 Not Taking at Unknown time    Patient Stressors: Financial difficulties Substance abuse  Patient Strengths: Ability for Estate manager/land agent for treatment/growth  Treatment Modalities: Medication Management, Group therapy, Case management,  1 to 1 session with clinician, Psychoeducation, Recreational therapy.   Physician Treatment Plan for Primary Diagnosis: Alcohol dependence with alcohol-induced mood disorder ([place]) Long Term Goal(s): Improvement in symptoms so as ready for discharge Improvement in symptoms so as ready for discharge   Short Term Goals: Ability to identify changes in lifestyle to reduce recurrence of condition will improve Ability to verbalize feelings will improve Ability to disclose and discuss suicidal ideas Ability to demonstrate self-control will improve Ability to identify and develop effective coping behaviors will improve Ability to maintain clinical measurements within normal  limits will improve Compliance with prescribed medications will improve Ability to identify triggers associated with substance abuse/mental health issues will improve Ability to identify changes in lifestyle to reduce recurrence of condition will improve Ability to verbalize feelings will improve Ability to disclose and discuss suicidal ideas Ability to demonstrate self-control will  improve Ability to identify and develop effective coping behaviors will improve Ability to maintain clinical measurements within normal limits will improve Compliance with prescribed medications will improve Ability to identify triggers associated with substance abuse/mental health issues will improve  Medication Management: Evaluate patient's response, side effects, and tolerance of medication regimen.  Therapeutic Interventions: 1 to 1 sessions, Unit Group sessions and Medication administration.  Evaluation of Outcomes: Met  Physician Treatment Plan for Secondary Diagnosis: Principal Problem:   Alcohol dependence with alcohol-induced mood disorder ([place]) Active Problems:   Major depressive disorder, recurrent severe without psychotic features ([place])  Long Term Goal(s): Improvement in symptoms so as ready for discharge Improvement in symptoms so as ready for discharge   Short Term Goals: Ability to identify changes in lifestyle to reduce recurrence of condition will improve Ability to verbalize feelings will improve Ability to disclose and discuss suicidal ideas Ability to demonstrate self-control will improve Ability to identify and develop effective coping behaviors will improve Ability to maintain clinical measurements within normal limits will improve Compliance with prescribed medications will improve Ability to identify triggers associated with substance abuse/mental health issues will improve Ability to identify changes in lifestyle to reduce recurrence of condition will improve Ability to verbalize feelings will improve Ability to disclose and discuss suicidal ideas Ability to demonstrate self-control will improve Ability to identify and develop effective coping behaviors will improve Ability to maintain clinical measurements within normal limits will improve Compliance with prescribed medications will improve Ability to identify triggers associated with substance  abuse/mental health issues will improve     Medication Management: Evaluate patient's response, side effects, and tolerance of medication regimen.  Therapeutic Interventions: 1 to 1 sessions, Unit Group sessions and Medication administration.  Evaluation of Outcomes: Met   RN Treatment Plan for Primary Diagnosis: Alcohol dependence with alcohol-induced mood disorder ([facility]) Long Term Goal(s): Knowledge of disease and therapeutic regimen to maintain health will improve  Short Term Goals: Ability to remain free from injury will improve, Ability to verbalize feelings will improve and Ability to disclose and discuss suicidal ideas  Medication Management: RN will administer medications as ordered by provider, will assess and evaluate patient's response and provide education to patient for prescribed medication. RN will report any adverse and/or side effects to prescribing provider.  Therapeutic Interventions: 1 on 1 counseling sessions, Psychoeducation, Medication administration, Evaluate responses to treatment, Monitor vital signs and CBGs as ordered, Perform/monitor CIWA, COWS, AIMS and Fall Risk screenings as ordered, Perform wound care treatments as ordered.  Evaluation of Outcomes: Met   LCSW Treatment Plan for Primary Diagnosis: Alcohol dependence with alcohol-induced mood disorder ([place]) Long Term Goal(s): Safe transition to appropriate next level of care at discharge, Engage patient in therapeutic group addressing interpersonal concerns.  Short Term Goals: Engage patient in aftercare planning with referrals and resources, Facilitate patient progression through stages of change regarding substance use diagnoses and concerns and Identify triggers associated with mental health/substance abuse issues  Therapeutic Interventions: Assess for all discharge needs, 1 to 1 time with Social worker, Explore available resources and support systems, Assess for adequacy in community support network,  Educate family and significant other(s) on suicide prevention, Complete Psychosocial Assessment, Interpersonal  group therapy.  Evaluation of Outcomes: Met   Progress in Treatment: Attending groups: Yes. Participating in groups: Yes. Taking medication as prescribed: Yes. Toleration medication: Yes. Family/Significant other contact made: SPE completed with pt; pt declined to consent to family contact.  Patient understands diagnosis: Yes. Discussing patient identified problems/goals with staff: Yes. Medical problems stabilized or resolved: Yes. Denies suicidal/homicidal ideation: Yes. Issues/concerns per patient self-inventory: No. Other: n/a   New problem(s) identified: No, Describe:  n/a  New Short Term/Long Term Goal(s): detox; medication stabilization; development of comprehensive mental wellness/sobriety plan.   Discharge Plan or Barriers: Pt accepted to [facility] for today---Driver will pick him up at 1pm. 21 day supply of medications required. Treatment team notified of above.   Reason for Continuation of Hospitalization: none  Estimated Length of Stay: discharge today   Attendees: Patient: 07/12/2016 10:15 AM  Physician: Dr. Parke Poisson MD 07/12/2016 10:15 AM  Nursing: Beola Cord; Cherene Julian RN 07/12/2016 10:15 AM  RN Care Manager: Lars Pinks CM 07/12/2016 10:15 AM  Social Worker: Press photographer, LCSW 07/12/2016 10:15 AM  Recreational Therapist:X  07/12/2016 10:15 AM  Other: Lindell Spar NP; May Augustin NP 07/12/2016 10:15 AM  Other:  07/12/2016 10:15 AM  Other: 07/12/2016 10:15 AM    Scribe for Treatment Team: [place], LCSW 07/12/2016 10:15 AM

## 2016-08-13 ENCOUNTER — Emergency Department (HOSPITAL_COMMUNITY)
Admission: EM | Admit: 2016-08-13 | Discharge: 2016-08-14 | Disposition: A | Discharge status: Home / Self Care | Payer: Federal, State, Local not specified - Other | Attending: Emergency Medicine | Admitting: Emergency Medicine

## 2016-08-13 ENCOUNTER — Encounter (HOSPITAL_COMMUNITY): Payer: Self-pay | Admitting: Nurse Practitioner

## 2016-08-13 DIAGNOSIS — F17210 Nicotine dependence, cigarettes, uncomplicated: Secondary | ICD-10-CM | POA: Insufficient documentation

## 2016-08-13 DIAGNOSIS — Z5900 Homelessness unspecified: Secondary | ICD-10-CM

## 2016-08-13 DIAGNOSIS — R569 Unspecified convulsions: Secondary | ICD-10-CM | POA: Insufficient documentation

## 2016-08-13 DIAGNOSIS — F10920 Alcohol use, unspecified with intoxication, uncomplicated: Secondary | ICD-10-CM | POA: Insufficient documentation

## 2016-08-13 DIAGNOSIS — R45851 Suicidal ideations: Secondary | ICD-10-CM | POA: Insufficient documentation

## 2016-08-13 DIAGNOSIS — F1024 Alcohol dependence with alcohol-induced mood disorder: Secondary | ICD-10-CM | POA: Diagnosis present

## 2016-08-13 DIAGNOSIS — F332 Major depressive disorder, recurrent severe without psychotic features: Secondary | ICD-10-CM | POA: Diagnosis present

## 2016-08-13 DIAGNOSIS — F1010 Alcohol abuse, uncomplicated: Secondary | ICD-10-CM

## 2016-08-13 DIAGNOSIS — I10 Essential (primary) hypertension: Secondary | ICD-10-CM | POA: Insufficient documentation

## 2016-08-13 LAB — CBC
HEMATOCRIT: 36.1 % — AB (ref 39.0–52.0)
HEMOGLOBIN: 12.4 g/dL — AB (ref 13.0–17.0)
MCH: 28.7 pg (ref 26.0–34.0)
MCHC: 34.3 g/dL (ref 30.0–36.0)
MCV: 83.6 fL (ref 78.0–100.0)
Platelets: 108 10*3/uL — ABNORMAL LOW (ref 150–400)
RBC: 4.32 MIL/uL (ref 4.22–5.81)
RDW: 15.6 % — ABNORMAL HIGH (ref 11.5–15.5)
WBC: 5.5 10*3/uL (ref 4.0–10.5)

## 2016-08-13 LAB — COMPREHENSIVE METABOLIC PANEL
ALBUMIN: 4.2 g/dL (ref 3.5–5.0)
ALT: 33 U/L (ref 17–63)
AST: 90 U/L — AB (ref 15–41)
Alkaline Phosphatase: 134 U/L — ABNORMAL HIGH (ref 38–126)
Anion gap: 12 (ref 5–15)
BUN: 7 mg/dL (ref 6–20)
CHLORIDE: 109 mmol/L (ref 101–111)
CO2: 21 mmol/L — ABNORMAL LOW (ref 22–32)
Calcium: 8.7 mg/dL — ABNORMAL LOW (ref 8.9–10.3)
Creatinine, Ser: 0.91 mg/dL (ref 0.61–1.24)
GFR calc Af Amer: >60 mL/min (ref >60)
GFR calc non Af Amer: >60 mL/min (ref >60)
GLUCOSE: 94 mg/dL (ref 65–99)
POTASSIUM: 3.2 mmol/L — AB (ref 3.5–5.1)
Sodium: 142 mmol/L (ref 135–145)
Total Bilirubin: 0.7 mg/dL (ref 0.3–1.2)
Total Protein: 7.7 g/dL (ref 6.5–8.1)

## 2016-08-13 LAB — RAPID URINE DRUG SCREEN, HOSP PERFORMED
AMPHETAMINES: NOT DETECTED
BARBITURATES: NOT DETECTED
Benzodiazepines: NOT DETECTED
COCAINE: NOT DETECTED
Opiates: NOT DETECTED
TETRAHYDROCANNABINOL: NOT DETECTED

## 2016-08-13 LAB — URINALYSIS, ROUTINE W REFLEX MICROSCOPIC
BILIRUBIN URINE: NEGATIVE
GLUCOSE, UA: NEGATIVE mg/dL
HGB URINE DIPSTICK: NEGATIVE
KETONES UR: NEGATIVE mg/dL
LEUKOCYTES UA: NEGATIVE
Nitrite: NEGATIVE
PROTEIN: NEGATIVE mg/dL
Specific Gravity, Urine: 1.003 — ABNORMAL LOW (ref 1.005–1.030)
pH: 6 (ref 5.0–8.0)

## 2016-08-13 LAB — ETHANOL: ALCOHOL ETHYL (B): 392 mg/dL — AB (ref <5)

## 2016-08-13 MED ORDER — VITAMIN B-1 100 MG PO TABS
100.0000 mg | ORAL_TABLET | Freq: Every day | ORAL | Status: DC
  Administered 2016-08-13 – 2016-08-14 (×2): 100 mg via ORAL
  Filled 2016-08-13 (×3): qty 1

## 2016-08-13 MED ORDER — LORAZEPAM 2 MG/ML IJ SOLN
0.0000 mg | Freq: Two times a day (BID) | INTRAMUSCULAR | Status: DC
Start: 2016-08-16 — End: 2016-08-14

## 2016-08-13 MED ORDER — ONDANSETRON HCL 4 MG PO TABS
4.0000 mg | ORAL_TABLET | Freq: Three times a day (TID) | ORAL | Status: DC | PRN
  Administered 2016-08-13: 4 mg via ORAL
  Filled 2016-08-13: qty 1

## 2016-08-13 MED ORDER — ZOLPIDEM TARTRATE 5 MG PO TABS
5.0000 mg | ORAL_TABLET | Freq: Every evening | ORAL | Status: DC | PRN
  Administered 2016-08-13: 5 mg via ORAL
  Filled 2016-08-13: qty 1

## 2016-08-13 MED ORDER — NICOTINE 21 MG/24HR TD PT24
21.0000 mg | MEDICATED_PATCH | Freq: Every day | TRANSDERMAL | Status: DC
  Filled 2016-08-13 (×2): qty 1

## 2016-08-13 MED ORDER — THIAMINE HCL 100 MG/ML IJ SOLN: 100.0000 mg | Freq: Every day | INTRAMUSCULAR | Status: DC

## 2016-08-13 MED ORDER — AMLODIPINE BESYLATE 5 MG PO TABS
5.0000 mg | ORAL_TABLET | Freq: Every day | ORAL | Status: DC
  Administered 2016-08-13 – 2016-08-14 (×2): 5 mg via ORAL
  Filled 2016-08-13 (×2): qty 1

## 2016-08-13 MED ORDER — LORAZEPAM 2 MG/ML IJ SOLN: 0.0000 mg | Freq: Four times a day (QID) | INTRAMUSCULAR | Status: DC

## 2016-08-13 MED ORDER — LORAZEPAM 1 MG PO TABS
0.0000 mg | ORAL_TABLET | Freq: Four times a day (QID) | ORAL | Status: DC
  Administered 2016-08-13: 1 mg via ORAL
  Administered 2016-08-13: 2 mg via ORAL
  Administered 2016-08-14: 1 mg via ORAL
  Administered 2016-08-14: 2 mg via ORAL
  Filled 2016-08-13 (×2): qty 1
  Filled 2016-08-13 (×2): qty 2

## 2016-08-13 MED ORDER — LORAZEPAM 1 MG PO TABS: 0.0000 mg | ORAL_TABLET | Freq: Two times a day (BID) | ORAL | Status: DC

## 2016-08-13 NOTE — BH Assessment (Signed)
[facility] Assessment Progress Note   Case was staffed with Lord DNP who recommended patient be re-evaluated in the a.m.    

## 2016-08-13 NOTE — ED Provider Notes (Signed)
[facility] DEPT Provider Note   CSN: [id number] Arrival date & time: 08/13/16  1443     History   Chief Complaint Chief Complaint  Patient presents with  . Alcohol Intoxication  . Suicidal    HPI Barry Moore is a 51 y.o. male.  Patient is a 51 year old male with a history of chronic alcohol abuse, seizures, hypertension presenting today with alcohol intoxication and suicidal ideation. Patient states he's been homeless for about a year and he feels very depressed and down. He is feeling worse and worse and would like to just end it all. He states he is planning on drinking himself to death today. However someone else who lives in the woods with him called 911. Patient states the paramedics ruined his plan. He was last here a few months ago but states he has not filled her continued taking his depression medication. He is also not taking any blood pressure medication. He denies any drug use, nausea, vomiting, chest pain or shortness of breath.   The history is provided by the patient.  Alcohol Intoxication  This is a recurrent problem.    Past Medical History:  Diagnosis Date  . Alcoholic hepatitis   . Depression   . ETOH abuse   . Hypertension   . Seizures [facility])     Patient Active Problem List   Diagnosis Date Noted  . Major depressive disorder, recurrent severe without psychotic features ([place]) 07/06/2016  . Alcohol-induced mood disorder ([place]) 06/15/2016  . Seizure ([place]) 05/26/2016  . Tobacco abuse 05/26/2016  . Homeless 05/26/2016  . Essential hypertension 05/26/2016  . MDD (major depressive disorder), recurrent severe, without psychosis ([place]) 03/06/2016  . Alcohol dependence with alcohol-induced mood disorder ([place]) 02/14/2016  . Severe alcohol withdrawal without perceptual disturbances without complication ([place]) 26/83/4196  . Alcoholic hepatitis without ascites 02/14/2016  . Thrombocytopenia ([place]) 02/14/2016    Past Surgical History:  Procedure Laterality  Date  . HERNIA REPAIR    . LEG SURGERY         Home Medications    Prior to Admission medications   Medication Sig Start Date End Date Taking? Authorizing Provider  acetaminophen (TYLENOL) 325 MG tablet Take 975 mg by mouth every 6 (six) hours as needed.   Yes [provider]  amLODipine (NORVASC) 5 MG tablet Take 1 tablet (5 mg total) by mouth daily. Patient not taking: Reported on 08/13/2016 07/13/16   Kerrie Buffalo, NP  citalopram (CELEXA) 20 MG tablet Take 1 tablet (20 mg total) by mouth daily. Patient not taking: Reported on 08/13/2016 07/13/16   Kerrie Buffalo, NP  gabapentin (NEURONTIN) 300 MG capsule Take 1 capsule (300 mg total) by mouth at bedtime. Patient not taking: Reported on 08/13/2016 07/12/16   Kerrie Buffalo, NP  traZODone (DESYREL) 100 MG tablet Take 1 tablet (100 mg total) by mouth at bedtime and may repeat dose one time if needed. Patient not taking: Reported on 08/13/2016 07/12/16   Kerrie Buffalo, NP    Family History Family History  Problem Relation Age of Onset  . Diabetes Mother   . Alcoholism Mother   . Emphysema Father   . Lung cancer Father   . Alcoholism Father     Social History Social History  Substance Use Topics  . Smoking status: Current Every Day Smoker    Packs/day: 2.00    Types: Cigarettes  . Smokeless tobacco: Never Used  . Alcohol use Yes     Comment: 5th liquor and one Forty daily  Allergies   Aspirin   Review of Systems Review of Systems  All other systems reviewed and are negative.    Physical Exam Updated Vital Signs BP 138/84 (BP Location: Right Arm)   Pulse 87   Temp 97.9 F (36.6 C) (Oral)   Resp 18   SpO2 94%   Physical Exam  Constitutional: He is oriented to person, place, and time. He appears well-developed and well-nourished. No distress.  HENT:  Head: Normocephalic and atraumatic.  Mouth/Throat: Oropharynx is clear and moist.  Eyes: Conjunctivae and EOM are normal. Pupils are equal,  round, and reactive to light.  Neck: Normal range of motion. Neck supple.  Cardiovascular: Normal rate, regular rhythm and intact distal pulses.   No murmur heard. Pulmonary/Chest: Effort normal and breath sounds normal. No respiratory distress. He has no wheezes. He has no rales.  Abdominal: Soft. He exhibits no distension. There is tenderness in the right lower quadrant, suprapubic area and left lower quadrant. There is no rebound and no guarding.  Musculoskeletal: Normal range of motion. He exhibits no edema or tenderness.  Neurological: He is alert and oriented to person, place, and time.  Skin: Skin is warm and dry. No rash noted. No erythema.  Psychiatric: His affect is blunt. Cognition and memory are impaired. He exhibits a depressed mood. He expresses suicidal ideation. He expresses suicidal plans.  Tearful on exam.  Desires to drink himself to death  Nursing note and vitals reviewed.    ED Treatments / Results  Labs (all labs ordered are listed, but only abnormal results are displayed) Labs Reviewed  COMPREHENSIVE METABOLIC PANEL - Abnormal; Notable for the following:       Result Value   Potassium 3.2 (*)    CO2 21 (*)    Calcium 8.7 (*)    AST 90 (*)    Alkaline Phosphatase 134 (*)    All other components within normal limits  ETHANOL - Abnormal; Notable for the following:    Alcohol, Ethyl (B) 392 (*)    All other components within normal limits  CBC - Abnormal; Notable for the following:    Hemoglobin 12.4 (*)    HCT 36.1 (*)    RDW 15.6 (*)    Platelets 108 (*)    All other components within normal limits  URINALYSIS, ROUTINE W REFLEX MICROSCOPIC - Abnormal; Notable for the following:    Color, Urine STRAW (*)    Specific Gravity, Urine 1.003 (*)    All other components within normal limits  RAPID URINE DRUG SCREEN, HOSP PERFORMED    EKG  EKG Interpretation None       Radiology No results found.  Procedures Procedures (including critical care  time)  Medications Ordered in ED Medications - No data to display   Initial Impression / Assessment and Plan / ED Course  I have reviewed the triage vital signs and the nursing notes.  Pertinent labs & imaging results that were available during my care of the patient were reviewed by me and considered in my medical decision making (see chart for details).     Patient who is a chronic alcoholic presents today with symptoms of being depressed and wanting to drink himself to death. He repeatedly states he just wants to die. He was doing that by drinking. He does not know how much she has drank today but was drinking liquor. His drug use and does not take any chronic medications. Patient has some mild lower abdominal tenderness which he  does not complain of until abdomen is palpated. He denies any nausea vomiting or diarrhea. He states his belly always hurts in these locations. Vital signs are within normal limits. Lab work is pending. TTS to evaluate.  4:46 PM Labs without acute findings. LFTs improved from some prior evaluation. No evidence of UTI.  Patient is medically clear. TTS has evaluated and recommends observation overnight and reevaluation in the morning. Patient was started on C1. He was started back on his blood pressure medication.  Final Clinical Impressions(s) / ED Diagnoses   Final diagnoses:  Alcohol abuse  Suicidal ideations    New Prescriptions New Prescriptions   No medications on file     Blanchie Dessert, MD 08/13/16 1646

## 2016-08-13 NOTE — ED Notes (Signed)
Patient placed in purple scrubs and wanded.

## 2016-08-13 NOTE — ED Notes (Signed)
Bed: WA27 Expected date:  Expected time:  Means of arrival:  Comments: 

## 2016-08-13 NOTE — BH Assessment (Addendum)
Assessment Note  Barry Moore is an 51 y.o. male that presents this date with thoughts of self harm with a plan to "drink himself to death." Patient is actively impaired at the time of the assessment and renders limited information. Patient has multiple admission to [facility] for similar symptoms with last admission on 07/06/16 when patient presented with similar symptoms being suicidal and impaired. [facility] for concerns of alcohol intoxication & suicidality. Pt reports to EMS that he intends to 'go to the woods and drink myself to death'. EMS called by pt's friend Patient was drowsy during assessment and found it difficult to answer some of the questions on the assessment. Patient did state he intended to "drink himself to death." Information for purposes of assessment was obtained from previous admission notes. BAL was not available at the time of the assessment. Patient was vague in reference to amount consumed this date stating "I drank a lot."  Patient denies any A/V hallucinations or H/I. Patient states "no, no" to most all questions. Per notes, patient drinks about half a gallon of liquor over the course of two days on average. Patient has a history of ETOH withdrawal seizures. Patient denies the use of any other illicit drugs. Patient is currently homeless. Per history patient has no history of OP treatment or is on any MH medications. Patient did answer "yes" when asked if he was depressed but was vague in reference to symptoms. Patient has history of depression per notes.Case was staffed with Reita Cliche DNP who recommended patient be re-evaluated in the a.m.   Diagnosis: MDD recurrent without psychotic features, ETOH abuse severe  Past Medical History:  Past Medical History:  Diagnosis Date  . Alcoholic hepatitis   . Depression   . ETOH abuse   . Hypertension   . Seizures ([place])     Past Surgical History:  Procedure Laterality Date  . HERNIA REPAIR    . LEG SURGERY       Family History:  Family History  Problem Relation Age of Onset  . Diabetes Mother   . Alcoholism Mother   . Emphysema Father   . Lung cancer Father   . Alcoholism Father     Social History:  reports that he has been smoking Cigarettes.  He has been smoking about 2.00 packs per day. He has never used smokeless tobacco. He reports that he drinks alcohol. He reports that he does not use drugs.  Additional Social History:  Alcohol / Drug Use Pain Medications: None Prescriptions: None Over the Counter: None History of alcohol / drug use?: Yes Longest period of sobriety (when/how long): "3 years when I was in prison" reports it was from 2013-2016 Withdrawal Symptoms: Agitation, Tremors, Nausea / Vomiting Onset of Seizures: Detox Date of most recent seizure: Pt states had 3 seizures today Substance #1 Name of Substance 1: ETOH ( usually liquor) 1 - Age of First Use: 51 years of age 16 - Amount (size/oz): half gallon or more and 12 oz beers 1 - Frequency: Daily 1 - Duration: Pt states "forever" 1 - Last Use / Amount: 08/13/16 4 12  oz beers  CIWA: CIWA-Ar BP: 138/84 Pulse Rate: 87 Nausea and Vomiting: no nausea and no vomiting Tactile Disturbances: none Tremor: no tremor Auditory Disturbances: not present Paroxysmal Sweats: no sweat visible Visual Disturbances: not present Anxiety: no anxiety, at ease Headache, Fullness in Head: none present Agitation: normal activity Orientation and Clouding of Sensorium: oriented and can do serial additions  CIWA-Ar Total: 0 COWS:    Allergies:  Allergies  Allergen Reactions  . Aspirin Nausea And Vomiting    Home Medications:  (Not in a hospital admission)  OB/GYN Status:  No LMP for male patient.  General Assessment Data Location of Assessment: [facility] TTS Assessment: In system Is this a Tele or Face-to-Face Assessment?: Face-to-Face Is this an Initial Assessment or a Re-assessment for this encounter?: Initial  Assessment Marital status: Single Maiden name: NA Is patient pregnant?: No Pregnancy Status: No Living Arrangements: Alone (Homeless) Can pt return to current living arrangement?: Yes Admission Status: Voluntary Is patient capable of signing voluntary admission?: Yes Referral Source: Self/Family/Friend Insurance type: Self Pay  Medical Screening Exam ([place]) Medical Exam completed: Yes  Crisis Care Plan Living Arrangements: Alone (Homeless) Legal Guardian:  (NA) Name of Psychiatrist: None Name of Therapist: None  Education Status Is patient currently in school?: No Current Grade: NA (NA) Highest grade of school patient has completed: NA Name of school:  (NA) Contact person: NA  Risk to self with the past 6 months Suicidal Ideation: Yes-Currently Present Has patient been a risk to self within the past 6 months prior to admission? : Yes Suicidal Intent: Yes-Currently Present Has patient had any suicidal intent within the past 6 months prior to admission? : Yes Is patient at risk for suicide?: Yes Suicidal Plan?: Yes-Currently Present Has patient had any suicidal plan within the past 6 months prior to admission? : Yes Specify Current Suicidal Plan: Drink himslf to death Access to Means: Yes Specify Access to Suicidal Means: Has ETOH What has been your use of drugs/alcohol within the last 12 months?: Current use Previous Attempts/Gestures: Yes How many times?:  (Multiple per patient) Other Self Harm Risks: NA Triggers for Past Attempts: Unknown Intentional Self Injurious Behavior: None Family Suicide History: No Recent stressful life event(s): Other (Comment) (being lonely) Persecutory voices/beliefs?: No Depression: Yes Depression Symptoms:  (pt states "everything") Substance abuse history and/or treatment for substance abuse?: Yes Suicide prevention information given to non-admitted patients: Not applicable  Risk to Others within the past 6  months Homicidal Ideation: No Does patient have any lifetime risk of violence toward others beyond the six months prior to admission? : No Thoughts of Harm to Others: No Comment - Thoughts of Harm to Others: NA Current Homicidal Intent: No Current Homicidal Plan: No Access to Homicidal Means: No Identified Victim: NA History of harm to others?: No Assessment of Violence: None Noted Violent Behavior Description: NA Does patient have access to weapons?: No Criminal Charges Pending?: No Describe Pending Criminal Charges: NA Does patient have a court date: No Is patient on probation?: No  Psychosis Hallucinations: None noted Delusions: None noted  Mental Status Report Appearance/Hygiene: In hospital gown Eye Contact: Fair Motor Activity: Freedom of movement Speech: Soft, Slow Level of Consciousness: Drowsy Mood: Depressed Affect: Depressed Anxiety Level: Minimal Thought Processes: Relevant Judgement: Impaired Orientation: Place Obsessive Compulsive Thoughts/Behaviors: Minimal  Cognitive Functioning Concentration: Decreased Memory: Recent Intact IQ: Average Insight: Poor Impulse Control: Poor Appetite:  (UTA) Weight Loss: 0 Weight Gain:  (UTA) Sleep:  (UTA) Total Hours of Sleep:  (UTA)  ADLScreening [facility] Assessment Services) Patient's cognitive ability adequate to safely complete daily activities?: Yes Patient able to express need for assistance with ADLs?: Yes Independently performs ADLs?: Yes (appropriate for developmental age)  Prior Inpatient Therapy Prior Inpatient Therapy: Yes Prior Therapy Dates: 2018, 2017 Prior Therapy Facilty/Provider(s): [facility] Reason for Treatment: SI, SA issues  Prior Outpatient  Therapy Prior Outpatient Therapy: No Prior Therapy Dates: NA Prior Therapy Facilty/Provider(s): NA Reason for Treatment: NA Does patient have an ACCT team?: No Does patient have Intensive In-House Services?  : No Does patient have [facility] services? :  No Does patient have P4CC services?: No  ADL Screening (condition at time of admission) Patient's cognitive ability adequate to safely complete daily activities?: Yes Is the patient deaf or have difficulty hearing?: No Does the patient have difficulty seeing, even when wearing glasses/contacts?: No Does the patient have difficulty concentrating, remembering, or making decisions?: No Patient able to express need for assistance with ADLs?: Yes Does the patient have difficulty dressing or bathing?: No Independently performs ADLs?: Yes (appropriate for developmental age) Does the patient have difficulty walking or climbing stairs?: No Weakness of Legs: None Weakness of Arms/Hands: None  Home Assistive Devices/Equipment Home Assistive Devices/Equipment: None  Therapy Consults (therapy consults require a physician order) PT Evaluation Needed: No OT Evalulation Needed: No SLP Evaluation Needed: No Abuse/Neglect Assessment (Assessment to be complete while patient is alone) Physical Abuse: Denies Verbal Abuse: Yes, past (Comment) (past hx with family) Sexual Abuse: Denies Exploitation of patient/patient's resources: Denies Self-Neglect: Denies Values / Beliefs Cultural Requests During Hospitalization: None Spiritual Requests During Hospitalization: None Consults Spiritual Care Consult Needed: No Social Work Consult Needed: No Regulatory affairs officer (For Healthcare) Does Patient Have a Medical Advance Directive?: No Would patient like information on creating a medical advance directive?: No - Patient declined    Additional Information 1:1 In Past 12 Months?: No CIRT Risk: No Elopement Risk: No Does patient have medical clearance?: Yes     Disposition: Case was staffed with Reita Cliche DNP who recommended patient be re-evaluated in the a.m. Disposition Initial Assessment Completed for this Encounter: Yes Disposition of Patient: Other dispositions Other disposition(s): Other (Comment)  (re-eval in the a.m.)  On Site Evaluation by:   Reviewed with Physician:    Mamie Nick 08/13/2016 4:27 PM

## 2016-08-13 NOTE — ED Notes (Signed)
Bed: WA09 Expected date:  Expected time:  Means of arrival:  Comments: 51 yo seizure-ETOH

## 2016-08-13 NOTE — ED Triage Notes (Signed)
[facility] presents [facility] for concerns of alcohol intoxication & suicidality. Pt reports to EMS that he intends to 'go to the woods and drink myself to death'. EMS called by pt's friend.

## 2016-08-14 ENCOUNTER — Encounter (HOSPITAL_COMMUNITY): Payer: Self-pay | Admitting: Registered Nurse

## 2016-08-14 DIAGNOSIS — F1010 Alcohol abuse, uncomplicated: Secondary | ICD-10-CM | POA: Diagnosis not present

## 2016-08-14 DIAGNOSIS — F1024 Alcohol dependence with alcohol-induced mood disorder: Secondary | ICD-10-CM | POA: Diagnosis not present

## 2016-08-14 DIAGNOSIS — Z811 Family history of alcohol abuse and dependence: Secondary | ICD-10-CM

## 2016-08-14 DIAGNOSIS — F332 Major depressive disorder, recurrent severe without psychotic features: Secondary | ICD-10-CM | POA: Diagnosis not present

## 2016-08-14 DIAGNOSIS — F17210 Nicotine dependence, cigarettes, uncomplicated: Secondary | ICD-10-CM

## 2016-08-14 DIAGNOSIS — Z590 Homelessness: Secondary | ICD-10-CM

## 2016-08-14 MED ORDER — GABAPENTIN 100 MG PO CAPS: 100.0000 mg | ORAL_CAPSULE | Freq: Three times a day (TID) | ORAL | 0 refills | Status: DC

## 2016-08-14 MED ORDER — GABAPENTIN 100 MG PO CAPS
100.0000 mg | ORAL_CAPSULE | Freq: Three times a day (TID) | ORAL | Status: DC
  Administered 2016-08-14: 100 mg via ORAL
  Filled 2016-08-14: qty 1

## 2016-08-14 NOTE — ED Notes (Signed)
Patient is requesting to speak with doctor, states he is ready to leave.

## 2016-08-14 NOTE — [facility] Suicide Risk Assessment (Cosign Needed)
Suicide Risk Assessment  Discharge Assessment   [facility] Discharge Suicide Risk Assessment   Principal Problem: MDD (major depressive disorder), recurrent severe, without psychosis ([place]) Discharge Diagnoses:  Patient Active Problem List   Diagnosis Date Noted  . Alcohol abuse [F10.10]   . Major depressive disorder, recurrent severe without psychotic features ([place]) [F33.2] 07/06/2016  . Alcohol-induced mood disorder ([place]) [F10.94] 06/15/2016  . Seizure ([place]) [R56.9] 05/26/2016  . Tobacco abuse [Z72.0] 05/26/2016  . Homeless [Z59.0] 05/26/2016  . Essential hypertension [I10] 05/26/2016  . MDD (major depressive disorder), recurrent severe, without psychosis ([place]) [F33.2] 03/06/2016  . Alcohol dependence with alcohol-induced mood disorder ([place]) [F10.24] 02/14/2016  . Severe alcohol withdrawal without perceptual disturbances without complication ([place]) [M09.470] 02/14/2016  . Alcoholic hepatitis without ascites [K70.10] 02/14/2016  . Thrombocytopenia ([place]) [D69.6] 02/14/2016    Total Time spent with patient: 30 minutes  Musculoskeletal: Strength & Muscle Tone: within normal limits Gait & Station: normal Patient leans: N/A  Psychiatric Specialty Exam: Psychiatric Specialty Exam: Physical Exam  Neck: Normal range of motion.  Respiratory: Effort normal.  Neurological: He is alert.    ROS  Blood pressure (!) 159/83, pulse 83, temperature 98.7 F (37.1 C), resp. rate 18, SpO2 97 %.There is no height or weight on file to calculate BMI.  General Appearance: Fairly Groomed  Eye Contact:  Good  Speech:  Clear and Coherent and Normal Rate  Volume:  Normal  Mood:  Anxious  Affect:  Appropriate  Thought Process:  Coherent  Orientation:  Full (Time, Place, and Person)  Thought Content:  Denies hallucinations, delusions, and paranoia  Suicidal Thoughts:  Denies at this time  Homicidal Thoughts:  No  Memory:  Immediate;   Good Recent;   Good Remote;   Good  Judgement:  Fair  Insight:   Fair  Psychomotor Activity:  Tremor  Concentration:  Concentration: Fair and Attention Span: Fair  Recall:  Good  Fund of Knowledge:  Fair  Language:  Good  Akathisia:  No  Handed:  Right  AIMS (if indicated):     Assets:  Communication Skills Desire for Improvement  ADL's:  Intact  Cognition:  WNL  Sleep:       Mental Status Per Nursing Assessment::   On Admission:     Demographic Factors:  Male, Caucasian and Low socioeconomic status  Loss Factors: NA  Historical Factors: Family history of mental illness or substance abuse and Impulsivity  Risk Reduction Factors:   Wanting to get better and quite drinking alcohol  Continued Clinical Symptoms:  Alcohol/Substance Abuse/Dependencies  Cognitive Features That Contribute To Risk:  None    Suicide Risk:  Minimal: No identifiable suicidal ideation.  Patients presenting with no risk factors but with morbid ruminations; may be classified as minimal risk based on the severity of the depressive symptoms    Plan Of Care/Follow-up recommendations:  Activity:  As tolerated Diet:  Heart healthy Other:  Follow up with [facility]  Rankin, [place], NP 08/14/2016, 4:45 PM

## 2016-08-14 NOTE — ED Notes (Signed)
Discharge instructions reviewed with patient using teach back method no questions at this time. 

## 2016-08-14 NOTE — Consult Note (Signed)
[facility] Face-to-Face Psychiatry Consult   Reason for Consult:  Suicidal ideation Referring Physician:  EDP Patient Identification: Barry Moore MRN:  [id number] Principal Diagnosis: MDD (major depressive disorder), recurrent severe, without psychosis ([place]) Diagnosis:   Patient Active Problem List   Diagnosis Date Noted  . Major depressive disorder, recurrent severe without psychotic features ([place]) [F33.2] 07/06/2016  . Alcohol-induced mood disorder ([place]) [F10.94] 06/15/2016  . Seizure ([place]) [R56.9] 05/26/2016  . Tobacco abuse [Z72.0] 05/26/2016  . Homeless [Z59.0] 05/26/2016  . Essential hypertension [I10] 05/26/2016  . MDD (major depressive disorder), recurrent severe, without psychosis ([place]) [F33.2] 03/06/2016  . Alcohol dependence with alcohol-induced mood disorder ([employer]) [F10.24] 02/14/2016  . Severe alcohol withdrawal without perceptual disturbances without complication ([place]) [R83.094] 02/14/2016  . Alcoholic hepatitis without ascites [K70.10] 02/14/2016  . Thrombocytopenia ([place]) [D69.6] 02/14/2016    Total Time spent with patient: 45 minutes  Subjective:   Barry Moore is a 51 y.o. male patient present to [facility] intoxicated with complaints of suicidal ideation.  HPI:  patient seen by Dr. Modesta Messing and this provider.  Chart reviewed and face to face evaluation on 08/14/16.   On evaluation:  Barry Moore reports that he was having a bad day yesterday with worsening depression.  States that he recently had two friends that were killed 2 months ago.  Also states that he has no one else; "my friends are dead, my mama, sister, brother and daddy are dead.  I don't have nobody."  Reporting that feeling lonely is his biggest stressor.  States that he stop taking his psychiatric medication after discharge from psychiatric hospital because he doesn't like how it makes him hill.   Patient states that he is drinking fifth of vodka to 3-4 forty ounce beers a day.  Reports that he does have a history  of withdrawal seizures.   At this time patient denies suicidal/homicidal ideation, psychosis, and paranoia.     Past Psychiatric History: Depression, Alcohol abuse/dependence.  Inpatient and outpatient psychiatric services.  Prior psychotropic but not compliant related to not like how medications make him fill  Risk to Self: Suicidal Ideation: Yes-Currently Present Suicidal Intent: Yes-Currently Present Is patient at risk for suicide?: Yes Suicidal Plan?: Yes-Currently Present Specify Current Suicidal Plan: Drink himslf to death Access to Means: Yes Specify Access to Suicidal Means: Has ETOH What has been your use of drugs/alcohol within the last 12 months?: Current use How many times?:  (Multiple per patient) Other Self Harm Risks: NA Triggers for Past Attempts: Unknown Intentional Self Injurious Behavior: None Risk to Others: Homicidal Ideation: No Thoughts of Harm to Others: No Comment - Thoughts of Harm to Others: NA Current Homicidal Intent: No Current Homicidal Plan: No Access to Homicidal Means: No Identified Victim: NA History of harm to others?: No Assessment of Violence: None Noted Violent Behavior Description: NA Does patient have access to weapons?: No Criminal Charges Pending?: No Describe Pending Criminal Charges: NA Does patient have a court date: No Prior Inpatient Therapy: Prior Inpatient Therapy: Yes Prior Therapy Dates: 2018, 2017 Prior Therapy Facilty/Provider(s): [facility] Reason for Treatment: SI, SA issues Prior Outpatient Therapy: Prior Outpatient Therapy: No Prior Therapy Dates: NA Prior Therapy Facilty/Provider(s): NA Reason for Treatment: NA Does patient have an ACCT team?: No Does patient have Intensive In-House Services?  : No Does patient have [facility] services? : No Does patient have P4CC services?: No  Past Medical History:  Past Medical History:  Diagnosis Date  . Alcoholic hepatitis   . Depression   .  ETOH abuse   . Hypertension   .  Seizures ([place])     Past Surgical History:  Procedure Laterality Date  . HERNIA REPAIR    . LEG SURGERY     Family History:  Family History  Problem Relation Age of Onset  . Diabetes Mother   . Alcoholism Mother   . Emphysema Father   . Lung cancer Father   . Alcoholism Father    Family Psychiatric  History:  Alcoholism mother and father Social History:  History  Alcohol Use  . Yes    Comment: 5th liquor and one Forty daily       History  Drug Use No    Social History   Social History  . Marital status: Divorced    Spouse name: N/A  . Number of children: N/A  . Years of education: N/A   Social History Main Topics  . Smoking status: Current Every Day Smoker    Packs/day: 2.00    Types: Cigarettes  . Smokeless tobacco: Never Used  . Alcohol use Yes     Comment: 5th liquor and one Forty daily    . Drug use: No  . Sexual activity: Not Currently   Other Topics Concern  . None   Social History Narrative  . None   Additional Social History:    Allergies:   Allergies  Allergen Reactions  . Aspirin Nausea And Vomiting    Labs:  Results for orders placed or performed during the hospital encounter of 08/13/16 (from the past 48 hour(s))  Comprehensive metabolic panel     Status: Abnormal   Collection Time: 08/13/16  3:22 PM  Result Value Ref Range   Sodium 142 135 - 145 mmol/L   Potassium 3.2 (L) 3.5 - 5.1 mmol/L   Chloride 109 101 - 111 mmol/L   CO2 21 (L) 22 - 32 mmol/L   Glucose, Bld 94 65 - 99 mg/dL   BUN 7 6 - 20 mg/dL   Creatinine, Ser 0.91 0.61 - 1.24 mg/dL   Calcium 8.7 (L) 8.9 - 10.3 mg/dL   Total Protein 7.7 6.5 - 8.1 g/dL   Albumin 4.2 3.5 - 5.0 g/dL   AST 90 (H) 15 - 41 U/L   ALT 33 17 - 63 U/L   Alkaline Phosphatase 134 (H) 38 - 126 U/L   Total Bilirubin 0.7 0.3 - 1.2 mg/dL   GFR calc non Af Amer >60 >60 mL/min   GFR calc Af Amer >60 >60 mL/min    Comment: (NOTE) The eGFR has been calculated using the CKD EPI equation. This calculation  has not been validated in all clinical situations. eGFR's persistently <60 mL/min signify possible Chronic Kidney Disease.    Anion gap 12 5 - 15  Ethanol     Status: Abnormal   Collection Time: 08/13/16  3:22 PM  Result Value Ref Range   Alcohol, Ethyl (B) 392 (HH) <5 mg/dL    Comment:        LOWEST DETECTABLE LIMIT FOR SERUM ALCOHOL IS 5 mg/dL FOR MEDICAL PURPOSES ONLY CRITICAL RESULT CALLED TO, READ BACK BY AND VERIFIED WITH: J.TALKINGTON AT 3005 ON 08/13/16 BY N.THOMPSON   cbc     Status: Abnormal   Collection Time: 08/13/16  3:22 PM  Result Value Ref Range   WBC 5.5 4.0 - 10.5 K/uL   RBC 4.32 4.22 - 5.81 MIL/uL   Hemoglobin 12.4 (L) 13.0 - 17.0 g/dL   HCT 36.1 (L) 39.0 - 52.0 %  MCV 83.6 78.0 - 100.0 fL   MCH 28.7 26.0 - 34.0 pg   MCHC 34.3 30.0 - 36.0 g/dL   RDW 15.6 (H) 11.5 - 15.5 %   Platelets 108 (L) 150 - 400 K/uL    Comment: SPECIMEN CHECKED FOR CLOTS PLATELET COUNT CONFIRMED BY SMEAR   Rapid urine drug screen (hospital performed)     Status: None   Collection Time: 08/13/16  4:26 PM  Result Value Ref Range   Opiates NONE DETECTED NONE DETECTED   Cocaine NONE DETECTED NONE DETECTED   Benzodiazepines NONE DETECTED NONE DETECTED   Amphetamines NONE DETECTED NONE DETECTED   Tetrahydrocannabinol NONE DETECTED NONE DETECTED   Barbiturates NONE DETECTED NONE DETECTED    Comment:        DRUG SCREEN FOR MEDICAL PURPOSES ONLY.  IF CONFIRMATION IS NEEDED FOR ANY PURPOSE, NOTIFY LAB WITHIN 5 DAYS.        LOWEST DETECTABLE LIMITS FOR URINE DRUG SCREEN Drug Class       Cutoff (ng/mL) Amphetamine      1000 Barbiturate      200 Benzodiazepine   573 Tricyclics       220 Opiates          300 Cocaine          300 THC              50   Urinalysis, Routine w reflex microscopic     Status: Abnormal   Collection Time: 08/13/16  4:26 PM  Result Value Ref Range   Color, Urine STRAW (A) YELLOW   APPearance CLEAR CLEAR   Specific Gravity, Urine 1.003 (L) 1.005 - 1.030    pH 6.0 5.0 - 8.0   Glucose, UA NEGATIVE NEGATIVE mg/dL   Hgb urine dipstick NEGATIVE NEGATIVE   Bilirubin Urine NEGATIVE NEGATIVE   Ketones, ur NEGATIVE NEGATIVE mg/dL   Protein, ur NEGATIVE NEGATIVE mg/dL   Nitrite NEGATIVE NEGATIVE   Leukocytes, UA NEGATIVE NEGATIVE    Current Facility-Administered Medications  Medication Dose Route Frequency Provider Last Rate Last Dose  . amLODipine (NORVASC) tablet 5 mg  5 mg Oral Daily Blanchie Dessert, MD   5 mg at 08/14/16 1005  . gabapentin (NEURONTIN) capsule 100 mg  100 mg Oral TID Norman Clay, MD      . LORazepam (ATIVAN) injection 0-4 mg  0-4 mg Intravenous Q6H Blanchie Dessert, MD       Or  . LORazepam (ATIVAN) tablet 0-4 mg  0-4 mg Oral Q6H Plunkett, Whitney, MD   1 mg at 08/14/16 1005  . [START ON 08/16/2016] LORazepam (ATIVAN) injection 0-4 mg  0-4 mg Intravenous Q12H Blanchie Dessert, MD       Or  . Derrill Memo ON 08/16/2016] LORazepam (ATIVAN) tablet 0-4 mg  0-4 mg Oral Q12H Plunkett, Whitney, MD      . nicotine (NICODERM CQ - dosed in mg/24 hours) patch 21 mg  21 mg Transdermal Daily Plunkett, Whitney, MD      . ondansetron (ZOFRAN) tablet 4 mg  4 mg Oral Q8H PRN Blanchie Dessert, MD   4 mg at 08/13/16 2103  . thiamine (VITAMIN B-1) tablet 100 mg  100 mg Oral Daily Blanchie Dessert, MD   100 mg at 08/14/16 1005   Or  . thiamine (B-1) injection 100 mg  100 mg Intravenous Daily Plunkett, Whitney, MD      . zolpidem (AMBIEN) tablet 5 mg  5 mg Oral QHS PRN Blanchie Dessert, MD   5  mg at 08/13/16 2154   Current Outpatient Prescriptions  Medication Sig Dispense Refill  . acetaminophen (TYLENOL) 325 MG tablet Take 975 mg by mouth every 6 (six) hours as needed.    [name] amLODipine (NORVASC) 5 MG tablet Take 1 tablet (5 mg total) by mouth daily. (Patient not taking: Reported on 08/13/2016) 30 tablet 0  . citalopram (CELEXA) 20 MG tablet Take 1 tablet (20 mg total) by mouth daily. (Patient not taking: Reported on 08/13/2016) 30 tablet 0  .  gabapentin (NEURONTIN) 300 MG capsule Take 1 capsule (300 mg total) by mouth at bedtime. (Patient not taking: Reported on 08/13/2016) 30 capsule 0  . traZODone (DESYREL) 100 MG tablet Take 1 tablet (100 mg total) by mouth at bedtime and may repeat dose one time if needed. (Patient not taking: Reported on 08/13/2016) 30 tablet 0    Musculoskeletal: Strength & Muscle Tone: within normal limits Gait & Station: normal Patient leans: N/A  Psychiatric Specialty Exam: Physical Exam  Neck: Normal range of motion.  Respiratory: Effort normal.  Neurological: He is alert.    ROS  Blood pressure (!) 159/83, pulse 83, temperature 98.7 F (37.1 C), resp. rate 18, SpO2 97 %.There is no height or weight on file to calculate BMI.  General Appearance: Fairly Groomed  Eye Contact:  Good  Speech:  Clear and Coherent and Normal Rate  Volume:  Normal  Mood:  Anxious and Depressed  Affect:  Appropriate  Thought Process:  Coherent  Orientation:  Full (Time, Place, and Person)  Thought Content:  Denies hallucinations, delusions, and paranoia  Suicidal Thoughts:  Denies at this time  Homicidal Thoughts:  No  Memory:  Immediate;   Good Recent;   Good Remote;   Good  Judgement:  Fair  Insight:  Fair  Psychomotor Activity:  Restlessness and Tremor  Concentration:  Concentration: Fair and Attention Span: Fair  Recall:  Good  Fund of Knowledge:  Fair  Language:  Good  Akathisia:  No  Handed:  Right  AIMS (if indicated):     Assets:  Communication Skills Desire for Improvement  ADL's:  Intact  Cognition:  WNL  Sleep:        Treatment Plan Summary: Plan Monitor over night for withdrawal symptoms  Disposition: Start detox protocol and monitor for withdrawl symptoms.  Possible discharge tomorrow   Addendum:  Patient states that he is not suicidal/homicidal and denies psychosis and paranoia.  States that he is ready to go home and that he does not need detox.  Will discharge home to follow up with  [facility].  Deanta Mincey, NP 08/14/2016 1:08 PM

## 2016-08-17 ENCOUNTER — Emergency Department (HOSPITAL_COMMUNITY)
Admission: EM | Admit: 2016-08-17 | Discharge: 2016-08-18 | Disposition: A | Discharge status: Home / Self Care | Payer: Self-pay | Attending: Emergency Medicine | Admitting: Emergency Medicine

## 2016-08-17 DIAGNOSIS — F1010 Alcohol abuse, uncomplicated: Secondary | ICD-10-CM | POA: Insufficient documentation

## 2016-08-17 DIAGNOSIS — F17210 Nicotine dependence, cigarettes, uncomplicated: Secondary | ICD-10-CM | POA: Insufficient documentation

## 2016-08-17 DIAGNOSIS — Z79899 Other long term (current) drug therapy: Secondary | ICD-10-CM | POA: Insufficient documentation

## 2016-08-17 DIAGNOSIS — I10 Essential (primary) hypertension: Secondary | ICD-10-CM | POA: Insufficient documentation

## 2016-08-17 LAB — CBC WITH DIFFERENTIAL/PLATELET
BASOS ABS: 0 10*3/uL (ref 0.0–0.1)
BASOS PCT: 1 %
EOS ABS: 0.3 10*3/uL (ref 0.0–0.7)
EOS PCT: 5 %
HCT: 36.3 % — ABNORMAL LOW (ref 39.0–52.0)
HEMOGLOBIN: 12.1 g/dL — AB (ref 13.0–17.0)
LYMPHS ABS: 2.5 10*3/uL (ref 0.7–4.0)
Lymphocytes Relative: 42 %
MCH: 28.9 pg (ref 26.0–34.0)
MCHC: 33.3 g/dL (ref 30.0–36.0)
MCV: 86.6 fL (ref 78.0–100.0)
Monocytes Absolute: 0.6 10*3/uL (ref 0.1–1.0)
Monocytes Relative: 10 %
NEUTROS PCT: 42 %
Neutro Abs: 2.5 10*3/uL (ref 1.7–7.7)
PLATELETS: 174 10*3/uL (ref 150–400)
RBC: 4.19 MIL/uL — AB (ref 4.22–5.81)
RDW: 16.8 % — ABNORMAL HIGH (ref 11.5–15.5)
WBC: 5.8 10*3/uL (ref 4.0–10.5)
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
## 2016-08-17 NOTE — ED Notes (Signed)
seizure pads applied to bed rails.

## 2016-08-18 LAB — BASIC METABOLIC PANEL
ANION GAP: 7 (ref 5–15)
BUN: 7 mg/dL (ref 6–20)
CALCIUM: 8.7 mg/dL — AB (ref 8.9–10.3)
CHLORIDE: 107 mmol/L (ref 101–111)
CO2: 24 mmol/L (ref 22–32)
Creatinine, Ser: 0.83 mg/dL (ref 0.61–1.24)
GFR calc non Af Amer: >60 mL/min (ref >60)
Glucose, Bld: 107 mg/dL — ABNORMAL HIGH (ref 65–99)
Potassium: 4.7 mmol/L (ref 3.5–5.1)
SODIUM: 138 mmol/L (ref 135–145)

## 2016-08-18 LAB — ETHANOL: ALCOHOL ETHYL (B): 462 mg/dL — AB (ref <5)

## 2016-08-18 NOTE — ED Notes (Signed)
Pt is in stable condition upon d/c and ambulates from ED. Pt was given a bus pass prior to dc.

## 2016-08-19 ENCOUNTER — Emergency Department (HOSPITAL_COMMUNITY)
Admission: EM | Admit: 2016-08-19 | Discharge: 2016-08-20 | Disposition: A | Discharge status: Home / Self Care | Payer: Federal, State, Local not specified - Other

## 2016-08-19 ENCOUNTER — Encounter (HOSPITAL_COMMUNITY): Payer: Self-pay

## 2016-08-19 ENCOUNTER — Emergency Department (HOSPITAL_COMMUNITY): Payer: Self-pay

## 2016-08-19 DIAGNOSIS — I10 Essential (primary) hypertension: Secondary | ICD-10-CM | POA: Insufficient documentation

## 2016-08-19 DIAGNOSIS — F17210 Nicotine dependence, cigarettes, uncomplicated: Secondary | ICD-10-CM | POA: Insufficient documentation

## 2016-08-19 DIAGNOSIS — F329 Major depressive disorder, single episode, unspecified: Secondary | ICD-10-CM | POA: Insufficient documentation

## 2016-08-19 DIAGNOSIS — Z79899 Other long term (current) drug therapy: Secondary | ICD-10-CM | POA: Insufficient documentation

## 2016-08-19 DIAGNOSIS — Z9114 Patient's other noncompliance with medication regimen: Secondary | ICD-10-CM | POA: Insufficient documentation

## 2016-08-19 DIAGNOSIS — F1024 Alcohol dependence with alcohol-induced mood disorder: Secondary | ICD-10-CM | POA: Diagnosis present

## 2016-08-19 DIAGNOSIS — R45850 Homicidal ideations: Secondary | ICD-10-CM

## 2016-08-19 DIAGNOSIS — F331 Major depressive disorder, recurrent, moderate: Secondary | ICD-10-CM

## 2016-08-19 LAB — COMPREHENSIVE METABOLIC PANEL
ALBUMIN: 4.7 g/dL (ref 3.5–5.0)
ALT: 27 U/L (ref 17–63)
ANION GAP: 12 (ref 5–15)
AST: 53 U/L — AB (ref 15–41)
Alkaline Phosphatase: 101 U/L (ref 38–126)
BILIRUBIN TOTAL: 0.6 mg/dL (ref 0.3–1.2)
BUN: 8 mg/dL (ref 6–20)
CHLORIDE: 105 mmol/L (ref 101–111)
CO2: 24 mmol/L (ref 22–32)
Calcium: 8.8 mg/dL — ABNORMAL LOW (ref 8.9–10.3)
Creatinine, Ser: 0.81 mg/dL (ref 0.61–1.24)
GFR calc Af Amer: >60 mL/min (ref >60)
GLUCOSE: 87 mg/dL (ref 65–99)
POTASSIUM: 3.4 mmol/L — AB (ref 3.5–5.1)
Sodium: 141 mmol/L (ref 135–145)
TOTAL PROTEIN: 8.1 g/dL (ref 6.5–8.1)

## 2016-08-19 LAB — URINALYSIS, ROUTINE W REFLEX MICROSCOPIC
Bilirubin Urine: NEGATIVE
GLUCOSE, UA: NEGATIVE mg/dL
Hgb urine dipstick: NEGATIVE
Ketones, ur: NEGATIVE mg/dL
Leukocytes, UA: NEGATIVE
Nitrite: NEGATIVE
PH: 6 (ref 5.0–8.0)
Protein, ur: NEGATIVE mg/dL
SPECIFIC GRAVITY, URINE: 1.002 — AB (ref 1.005–1.030)

## 2016-08-19 LAB — CBC
HCT: 37.5 % — ABNORMAL LOW (ref 39.0–52.0)
Hemoglobin: 12.6 g/dL — ABNORMAL LOW (ref 13.0–17.0)
MCH: 28.6 pg (ref 26.0–34.0)
MCHC: 33.6 g/dL (ref 30.0–36.0)
MCV: 85 fL (ref 78.0–100.0)
PLATELETS: 157 10*3/uL (ref 150–400)
RBC: 4.41 MIL/uL (ref 4.22–5.81)
RDW: 16.8 % — AB (ref 11.5–15.5)
WBC: 7 10*3/uL (ref 4.0–10.5)

## 2016-08-19 LAB — ETHANOL: ALCOHOL ETHYL (B): 435 mg/dL — AB (ref <5)

## 2016-08-19 LAB — RAPID URINE DRUG SCREEN, HOSP PERFORMED
AMPHETAMINES: NOT DETECTED
BENZODIAZEPINES: NOT DETECTED
Barbiturates: NOT DETECTED
COCAINE: NOT DETECTED
Opiates: NOT DETECTED
Tetrahydrocannabinol: NOT DETECTED

## 2016-08-19 LAB — ACETAMINOPHEN LEVEL

## 2016-08-19 LAB — SALICYLATE LEVEL: Salicylate Lvl: <7 mg/dL (ref 2.8–30.0)

## 2016-08-19 LAB — LIPASE, BLOOD: Lipase: 46 U/L (ref 11–51)

## 2016-08-19 MED ORDER — GABAPENTIN 300 MG PO CAPS
300.0000 mg | ORAL_CAPSULE | Freq: Every day | ORAL | Status: DC
  Administered 2016-08-19: 300 mg via ORAL
  Filled 2016-08-19: qty 1

## 2016-08-19 MED ORDER — AMLODIPINE BESYLATE 5 MG PO TABS
5.0000 mg | ORAL_TABLET | Freq: Every day | ORAL | Status: DC
  Administered 2016-08-19 – 2016-08-20 (×2): 5 mg via ORAL
  Filled 2016-08-19 (×2): qty 1

## 2016-08-19 MED ORDER — VITAMIN B-1 100 MG PO TABS
100.0000 mg | ORAL_TABLET | Freq: Every day | ORAL | Status: DC
  Administered 2016-08-19 – 2016-08-20 (×2): 100 mg via ORAL
  Filled 2016-08-19 (×2): qty 1

## 2016-08-19 MED ORDER — CITALOPRAM HYDROBROMIDE 10 MG PO TABS
20.0000 mg | ORAL_TABLET | Freq: Every day | ORAL | Status: DC
  Administered 2016-08-19 – 2016-08-20 (×2): 20 mg via ORAL
  Filled 2016-08-19 (×2): qty 2

## 2016-08-19 MED ORDER — THIAMINE HCL 100 MG/ML IJ SOLN: 100.0000 mg | Freq: Every day | INTRAMUSCULAR | Status: DC

## 2016-08-19 MED ORDER — TRAZODONE HCL 100 MG PO TABS
100.0000 mg | ORAL_TABLET | Freq: Every evening | ORAL | Status: DC | PRN
  Administered 2016-08-19: 100 mg via ORAL
  Filled 2016-08-19: qty 1

## 2016-08-19 MED ORDER — LORAZEPAM 1 MG PO TABS
0.0000 mg | ORAL_TABLET | Freq: Four times a day (QID) | ORAL | Status: DC
  Administered 2016-08-19: 2 mg via ORAL
  Filled 2016-08-19: qty 2

## 2016-08-19 MED ORDER — LORAZEPAM 2 MG/ML IJ SOLN: 0.0000 mg | Freq: Four times a day (QID) | INTRAMUSCULAR | Status: DC

## 2016-08-19 MED ORDER — LORAZEPAM 1 MG PO TABS: 0.0000 mg | ORAL_TABLET | Freq: Two times a day (BID) | ORAL | Status: DC

## 2016-08-19 MED ORDER — LORAZEPAM 2 MG/ML IJ SOLN: 0.0000 mg | Freq: Two times a day (BID) | INTRAMUSCULAR | Status: DC
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
## 2016-08-19 NOTE — Care Management (Signed)
ED CM spoke with the patient at the bedside. Patient provided with contact information for [facility]. Encouraged to call or go to the clinic during walk-in hours. Discussed the availability of Financial and Social Services as well as a Administrator, sports. Provided the patient with a coupon for Gabapentin 300 mg tabs for $4.66 at [facility] and discussed the location of [facility] in [facility]. Informed the patient Celexa and Trazodone are $4 at [facility] and Norvasc is $12 at [facility]. He states he is aware of 2 of the medications being low cost at [employer]. Patient verbalizes understanding of the information provided. Presenter, broadcasting BSN CCM

## 2016-08-19 NOTE — BH Assessment (Signed)
Per Jinny Blossom, NP, patient to remain in the ED overnight. Pending am psych evaluation.

## 2016-08-19 NOTE — BH Assessment (Signed)
Assessment Note  Barry Moore is an 51 y.o. male. He presents to [facility] with medical complaints and intoxicated.  He expressed thoughts of wanting to harm others. He sts that it's 3 people that he wants to harm. He refused to disclose andy further details about these individuals. He did share that 2 of those individuals that he wants to harm live in [place] and the other lives in [place]. He would not explain his reason for homicidal ideations toward these individuals. He state that he would harm them by, "Beating the hell out of him". Patient asked if he has access to means and he responded, "I have two fist and two fee". He denies a history of violent or aggressive behaviors. Patient denies suicidal thoughts. He does have a history of suicide attempts (3x's). Current stressors include homelessness and loneliness. He denies current AVH's. He has a history of hearing voices that call his name. He reports occasional cocaine use. Last use was 2 yrs ago. He drinks alcohol regularly. Last use today. Patient's BAL was 435 upon arrival. Patient hospitalized at [facility] in the past.  Last [facility] admission was 07/06/2016. He does not have a current outpatient provider.   Diagnosis: Depressive Disorder and Alcohol Use Disorder, Severe  Past Medical History:  Past Medical History:  Diagnosis Date  . Alcoholic hepatitis   . Depression   . ETOH abuse   . Hypertension   . Seizures ([place])     Past Surgical History:  Procedure Laterality Date  . HERNIA REPAIR    . LEG SURGERY      Family History:  Family History  Problem Relation Age of Onset  . Diabetes Mother   . Alcoholism Mother   . Emphysema Father   . Lung cancer Father   . Alcoholism Father     Social History:  reports that he has been smoking Cigarettes.  He has been smoking about 2.00 packs per day. He has never used smokeless tobacco. He reports that he drinks alcohol. He reports that he does not use drugs.  Additional Social History:  Alcohol  / Drug Use Pain Medications: None Prescriptions: None Over the Counter: None History of alcohol / drug use?: Yes Longest period of sobriety (when/how long): "3 years when I was in prison" reports it was from 2013-2016; since being out of prison 15 days  Negative Consequences of Use: Personal relationships, Financial, Work / School Withdrawal Symptoms: Agitation, Tremors, Nausea / Vomiting Onset of Seizures: Detox Date of most recent seizure: "The other day......I had several back to back" Substance #1 Name of Substance 1: ETOH ( usually liquor) 1 - Age of First Use: 51 years of age 58 - Amount (size/oz): half gallon or more and 12 oz beers 1 - Frequency: Daily 1 - Duration: Pt states "forever" 1 - Last Use / Amount: 08/19/2016 Substance #2 Name of Substance 2: Cocaine  2 - Age of First Use: "I don't know" 2 - Amount (size/oz): "No much" 2 - Frequency: "Not very often...it's really not my thing" 2 - Duration: on-going  2 - Last Use / Amount: "2 days ago"; "I don't know how much...I took a puff through a pipe.Marland KitchenMarland KitchenI just wanted to get a blast"  CIWA: CIWA-Ar BP: 132/83 Pulse Rate: 98 Nausea and Vomiting: no nausea and no vomiting Tactile Disturbances: very mild itching, pins and needles, burning or numbness Tremor: not visible, but can be felt fingertip to fingertip Auditory Disturbances: very mild harshness or ability to frighten Paroxysmal Sweats: no  sweat visible Visual Disturbances: very mild sensitivity Anxiety: mildly anxious Headache, Fullness in Head: none present Agitation: two Orientation and Clouding of Sensorium: oriented and can do serial additions CIWA-Ar Total: 7 COWS:    Allergies:  Allergies  Allergen Reactions  . Aspirin Nausea And Vomiting    Home Medications:  (Not in a hospital admission)  OB/GYN Status:  No LMP for male patient.  General Assessment Data [facility] of Assessment: [facility] TTS Assessment: In system Is this a Tele or Face-to-Face  Assessment?: Face-to-Face Is this an Initial Assessment or a Re-assessment for this encounter?: Initial Assessment Marital status: Single Maiden name:  (n/a) Is patient pregnant?: No Pregnancy Status: No Living Arrangements: Alone (Homeless) Can pt return to current living arrangement?: Yes Admission Status: Voluntary Is patient capable of signing voluntary admission?: Yes Referral Source: Self/Family/Friend Insurance type:  (Self Pay )     Crisis Care Plan Living Arrangements: Alone (Homeless) Legal Guardian: Other: (no legal guardian ) Name of Psychiatrist: None Name of Therapist: None  Education Status Is patient currently in school?: No Current Grade:  (n/a) Highest grade of school patient has completed: NA Name of school:  (NA) Contact person: NA  Risk to self with the past 6 months Suicidal Ideation: No-Not Currently/Within Last 6 Months Has patient been a risk to self within the past 6 months prior to admission? : No Suicidal Intent: No-Not Currently/Within Last 6 Months Has patient had any suicidal intent within the past 6 months prior to admission? : No Is patient at risk for suicide?: No Suicidal Plan?: No-Not Currently/Within Last 6 Months Has patient had any suicidal plan within the past 6 months prior to admission? : No Specify Current Suicidal Plan:  (denies SI) Access to Means: No Specify Access to Suicidal Means:  (no access to firearms) What has been your use of drugs/alcohol within the last 12 months?:  (patient has a hx of cocaine use and alcohol ) Previous Attempts/Gestures: Yes How many times?:  (3x's) Other Self Harm Risks:  (denies ) Triggers for Past Attempts: Other (Comment) ("living in the woods", not having anyone to talk to ) Intentional Self Injurious Behavior: None Family Suicide History: No Recent stressful life event(s): Other (Comment) (living arrangements (living in woods), bad relationships) Persecutory voices/beliefs?: No Depression:  Yes Depression Symptoms: Feeling angry/irritable, Loss of interest in usual pleasures, Fatigue, Isolating Substance abuse history and/or treatment for substance abuse?: Yes Suicide prevention information given to non-admitted patients: Not applicable  Risk to Others within the past 6 months Homicidal Ideation: Yes-Currently Present Does patient have any lifetime risk of violence toward others beyond the six months prior to admission? : Yes (comment) Thoughts of Harm to Others: Yes-Currently Present Comment - Thoughts of Harm to Others:  ("I have 3 people that I want to hurt.Marland KitchenMarland KitchenI'm not giving no nam) Current Homicidal Intent: Yes-Currently Present Current Homicidal Plan: Yes-Currently Present Describe Current Homicidal Plan:  ("I'm gonna beat they ass") Access to Homicidal Means: Yes Describe Access to Homicidal Means:  ("I have 2 fist and 2 feet") Identified Victim:  (3 people...2 in [place]...1 in [place]) History of harm to others?: No Assessment of Violence: None Noted Violent Behavior Description:  (currently calm and cooperative ) Does patient have access to weapons?: No Criminal Charges Pending?:  ("I have charges in [place].Marland KitchenMarland KitchenI'm not worried about it") Describe Pending Criminal Charges:  (Trespassing charges ) Does patient have a court date: No Is patient on probation?: No  Psychosis Hallucinations: Auditory ("I was hearing voices  last night") Delusions: None noted  Mental Status Report Appearance/Hygiene: In hospital gown Eye Contact: Fair Motor Activity: Freedom of movement Speech: Soft, Slow Level of Consciousness: Drowsy Mood: Depressed Affect: Depressed Anxiety Level: Minimal Thought Processes: Relevant Judgement: Impaired Orientation: Place, Situation, Time, Person Obsessive Compulsive Thoughts/Behaviors: Minimal  Cognitive Functioning Concentration: Decreased Memory: Remote Intact, Recent Intact IQ: Average Insight: Poor Impulse Control: Poor Appetite:  Good Weight Loss:  (0) Weight Gain:  (0) Sleep:  ("Not enough") Vegetative Symptoms: Not bathing  ADLScreening [facility] Assessment Services) Patient's cognitive ability adequate to safely complete daily activities?: Yes Patient able to express need for assistance with ADLs?: Yes Independently performs ADLs?: Yes (appropriate for developmental age)  Prior Inpatient Therapy Prior Inpatient Therapy: Yes Prior Therapy Dates: 2018, 2017 Prior Therapy Facilty/Provider(s): [facility] Reason for Treatment: SI, SA issues  Prior Outpatient Therapy Prior Outpatient Therapy: No Prior Therapy Dates: NA Prior Therapy Facilty/Provider(s): NA Reason for Treatment: NA Does patient have an ACCT team?: No Does patient have Intensive In-House Services?  : No Does patient have [facility] services? : No Does patient have P4CC services?: No  ADL Screening (condition at time of admission) Patient's cognitive ability adequate to safely complete daily activities?: Yes Patient able to express need for assistance with ADLs?: Yes Independently performs ADLs?: Yes (appropriate for developmental age)             Regulatory affairs officer (For Healthcare) Does Patient Have a Medical Advance Directive?: No Would patient like information on creating a medical advance directive?: No - Patient declined Nutrition Screen- [place] Adult/[facility] Patient's home diet: Regular  Additional Information 1:1 In Past 12 Months?: No CIRT Risk: No Elopement Risk: No Does patient have medical clearance?: Yes     Disposition:  Disposition Initial Assessment Completed for this Encounter: Yes  On Site Evaluation by:   Reviewed with Physician:    Waldon Merl 08/19/2016 6:00 PM

## 2016-08-19 NOTE — ED Triage Notes (Signed)
PT RECEIVED VIA EMS C/O LEFT ARM NUMBNESS X2 WEEKS. PT ALSO C/O ABDOMINAL PAIN WITH HICCUPS X1 WEEK. PER EMS, +ETOH, AND WHEN THE PT GOT INTO THEIR TRUCK, HE STS "I'M SUICIDAL, AND I WILL HURT PEOPLE BECAUSE I CAN, NOT BECAUSE I WANT TO." PT ALSO STS TO ME THAT HE HAS TRIED MULTIPLE TIMES TO END HIS LIFE.

## 2016-08-19 NOTE — Progress Notes (Signed)
CSW met with the pt and provided active listening and validated patient's concerns.  CSW provided pt with a meeting schedule for area Alcoholics Anonymous and Narcotics Anonymous 12-Step meetings.  CSW provided education to the pt as to the efficacy of 12-step programs for community support for those needing support in addition to or other than outpatient treatment.  CSW provided pt with address and walk-in clinic hours at [facility] to assist pt in seeking assistance with [place].  Pt appreciated CSW's efforts and thanked the CSW.  Alphonse Guild. Gaetano Romberger, Latanya Presser, LCAS Clinical Social Worker Ph: [phone]

## 2016-08-20 DIAGNOSIS — Z811 Family history of alcohol abuse and dependence: Secondary | ICD-10-CM | POA: Diagnosis not present

## 2016-08-20 DIAGNOSIS — F17210 Nicotine dependence, cigarettes, uncomplicated: Secondary | ICD-10-CM

## 2016-08-20 DIAGNOSIS — F1024 Alcohol dependence with alcohol-induced mood disorder: Secondary | ICD-10-CM

## 2016-08-20 MED ORDER — GABAPENTIN 300 MG PO CAPS: 300.0000 mg | ORAL_CAPSULE | Freq: Three times a day (TID) | ORAL | 0 refills | Status: DC

## 2016-08-20 MED ORDER — CITALOPRAM HYDROBROMIDE 20 MG PO TABS: 10.0000 mg | ORAL_TABLET | Freq: Every day | ORAL | 0 refills | Status: DC

## 2016-08-20 MED ORDER — GABAPENTIN 300 MG PO CAPS
300.0000 mg | ORAL_CAPSULE | Freq: Three times a day (TID) | ORAL | Status: DC
  Administered 2016-08-20: 300 mg via ORAL
  Filled 2016-08-20: qty 1

## 2016-08-20 NOTE — Discharge Instructions (Signed)
To help you maintain a sober lifestyle, a substance abuse treatment program may be beneficial to you.  Contact one of the following facilities at your earliest opportunity to ask about enrolling:  RESIDENTIAL PROGRAMS:       [facility]      [place], [place] [address]      [phone]       [facility]      [address] [place], [place] [address]      [phone]       [facility]      [place], [place] [address]      [phone]  OUTPATIENT PROGRAMS:       [facility]      [address] [place]. [place], [place] [address]      [phone]      New patients are seen at the walk-in clinic every Tuesday from 9:00 am - 12:00 pm.

## 2016-08-20 NOTE — BH Assessment (Signed)
[facility] Assessment Progress Note  Per Corena Pilgrim, MD, this pt does not require psychiatric hospitalization at this time.  Pt is to be discharged from [facility] with referral information for area substance abuse treatment providers.  This has been included in pt's discharge instructions.  Pt's nurse, Caren Griffins, has been notified.  Jalene Mullet, [place] Triage Specialist [phone]

## 2016-08-20 NOTE — Consult Note (Addendum)
[facility] Face-to-Face Psychiatry Consult   Reason for Consult:  Alcohol abuse with homicidal ideations Referring Physician:  EDP Patient Identification: Barry Moore MRN:  [id number] Principal Diagnosis: Alcohol dependence with alcohol-induced mood disorder [facility]) Diagnosis:   Patient Active Problem List   Diagnosis Date Noted  . Alcohol dependence with alcohol-induced mood disorder (HCC) [F10.24] 02/14/2016    Priority: High  . Alcohol abuse [F10.10]   . Major depressive disorder, recurrent severe without psychotic features (HCC) [F33.2] 07/06/2016  . Alcohol-induced mood disorder (HCC) [F10.94] 06/15/2016  . Seizure (HCC) [R56.9] 05/26/2016  . Tobacco abuse [Z72.0] 05/26/2016  . Homeless [Z59.0] 05/26/2016  . Essential hypertension [I10] 05/26/2016  . Severe alcohol withdrawal without perceptual disturbances without complication (HCC) [F10.230] 02/14/2016  . Alcoholic hepatitis without ascites [K70.10] 02/14/2016  . Thrombocytopenia (HCC) [D69.6] 02/14/2016    Total Time spent with patient: 45 minutes  Subjective:   Barry Moore is a 51 y.o. male patient does not warrant admission.  HPI:  51 yo male who presented to the ED with vague homicidal ideations.  Today on assessment, he denies coming to the ED for alcohol dependence, suicidal or homicidal ideations.  "That's not why I came, I came because my hands feel numb."  Discussed side effects of drinking and paresis but patient reported ambivalence about rehab as he "just at [facility]."  No suicidal/homicidal ideations, hallucinations, or withdrawal symptoms.  Past Psychiatric History: alcohol dependence, depression  Risk to Self: NOne Risk to Others: None Prior Inpatient Therapy: Prior Inpatient Therapy: Yes Prior Therapy Dates: 2018, 2017 Prior Therapy Facilty/Provider(s): [facility] Reason for Treatment: SI, SA issues Prior Outpatient Therapy: Prior Outpatient Therapy: No Prior Therapy Dates: NA Prior Therapy Facilty/Provider(s):  NA Reason for Treatment: NA Does patient have an ACCT team?: No Does patient have Intensive In-House Services?  : No Does patient have [facility] services? : No Does patient have P4CC services?: No  Past Medical History:  Past Medical History:  Diagnosis Date  . Alcoholic hepatitis   . Depression   . ETOH abuse   . Hypertension   . Seizures (HCC)     Past Surgical History:  Procedure Laterality Date  . HERNIA REPAIR    . LEG SURGERY     Family History:  Family History  Problem Relation Age of Onset  . Diabetes Mother   . Alcoholism Mother   . Emphysema Father   . Lung cancer Father   . Alcoholism Father    Family Psychiatric  History: unknown Social History:  History  Alcohol Use  . Yes    Comment: 5th liquor and one Forty daily       History  Drug Use No    Social History   Social History  . Marital status: Divorced    Spouse name: N/A  . Number of children: N/A  . Years of education: N/A   Social History Main Topics  . Smoking status: Current Every Day Smoker    Packs/day: 2.00    Types: Cigarettes  . Smokeless tobacco: Never Used  . Alcohol use Yes     Comment: 5th liquor and one Forty daily    . Drug use: No  . Sexual activity: Not Currently   Other Topics Concern  . None   Social History Narrative  . None   Additional Social History:    Allergies:   Allergies  Allergen Reactions  . Aspirin Nausea And Vomiting    Labs:  Results for orders placed or performed during the hospital  encounter of 08/19/16 (from the past 48 hour(s))  Rapid urine drug screen (hospital performed)     Status: None   Collection Time: 08/19/16  2:55 PM  Result Value Ref Range   Opiates NONE DETECTED NONE DETECTED   Cocaine NONE DETECTED NONE DETECTED   Benzodiazepines NONE DETECTED NONE DETECTED   Amphetamines NONE DETECTED NONE DETECTED   Tetrahydrocannabinol NONE DETECTED NONE DETECTED   Barbiturates NONE DETECTED NONE DETECTED    Comment:        DRUG SCREEN  FOR MEDICAL PURPOSES ONLY.  IF CONFIRMATION IS NEEDED FOR ANY PURPOSE, NOTIFY LAB WITHIN 5 DAYS.        LOWEST DETECTABLE LIMITS FOR URINE DRUG SCREEN Drug Class       Cutoff (ng/mL) Amphetamine      1000 Barbiturate      200 Benzodiazepine   829 Tricyclics       562 Opiates          300 Cocaine          300 THC              50   Urinalysis, Routine w reflex microscopic     Status: Abnormal   Collection Time: 08/19/16  2:55 PM  Result Value Ref Range   Color, Urine STRAW (A) YELLOW   APPearance CLEAR CLEAR   Specific Gravity, Urine 1.002 (L) 1.005 - 1.030   pH 6.0 5.0 - 8.0   Glucose, UA NEGATIVE NEGATIVE mg/dL   Hgb urine dipstick NEGATIVE NEGATIVE   Bilirubin Urine NEGATIVE NEGATIVE   Ketones, ur NEGATIVE NEGATIVE mg/dL   Protein, ur NEGATIVE NEGATIVE mg/dL   Nitrite NEGATIVE NEGATIVE   Leukocytes, UA NEGATIVE NEGATIVE  Comprehensive metabolic panel     Status: Abnormal   Collection Time: 08/19/16  3:04 PM  Result Value Ref Range   Sodium 141 135 - 145 mmol/L   Potassium 3.4 (L) 3.5 - 5.1 mmol/L   Chloride 105 101 - 111 mmol/L   CO2 24 22 - 32 mmol/L   Glucose, Bld 87 65 - 99 mg/dL   BUN 8 6 - 20 mg/dL   Creatinine, Ser 0.81 0.61 - 1.24 mg/dL   Calcium 8.8 (L) 8.9 - 10.3 mg/dL   Total Protein 8.1 6.5 - 8.1 g/dL   Albumin 4.7 3.5 - 5.0 g/dL   AST 53 (H) 15 - 41 U/L   ALT 27 17 - 63 U/L   Alkaline Phosphatase 101 38 - 126 U/L   Total Bilirubin 0.6 0.3 - 1.2 mg/dL   GFR calc non Af Amer >60 >60 mL/min   GFR calc Af Amer >60 >60 mL/min    Comment: (NOTE) The eGFR has been calculated using the CKD EPI equation. This calculation has not been validated in all clinical situations. eGFR's persistently <60 mL/min signify possible Chronic Kidney Disease.    Anion gap 12 5 - 15  Ethanol     Status: Abnormal   Collection Time: 08/19/16  3:04 PM  Result Value Ref Range   Alcohol, Ethyl (B) 435 (HH) <5 mg/dL    Comment:        LOWEST DETECTABLE LIMIT FOR SERUM ALCOHOL  IS 5 mg/dL FOR MEDICAL PURPOSES ONLY CRITICAL RESULT CALLED TO, READ BACK BY AND VERIFIED WITH: K.Pisgah AT 1308 ON 08/19/16 BY N.THOMPSON   Salicylate level     Status: None   Collection Time: 08/19/16  3:04 PM  Result Value Ref Range   Salicylate Lvl <6.5 2.8 -  30.0 mg/dL  Acetaminophen level     Status: Abnormal   Collection Time: 08/19/16  3:04 PM  Result Value Ref Range   Acetaminophen (Tylenol), Serum <10 (L) 10 - 30 ug/mL    Comment:        THERAPEUTIC CONCENTRATIONS VARY SIGNIFICANTLY. A RANGE OF 10-30 ug/mL MAY BE AN EFFECTIVE CONCENTRATION FOR MANY PATIENTS. HOWEVER, SOME ARE BEST TREATED AT CONCENTRATIONS OUTSIDE THIS RANGE. ACETAMINOPHEN CONCENTRATIONS >150 ug/mL AT 4 HOURS AFTER INGESTION AND >50 ug/mL AT 12 HOURS AFTER INGESTION ARE OFTEN ASSOCIATED WITH TOXIC REACTIONS.   cbc     Status: Abnormal   Collection Time: 08/19/16  3:04 PM  Result Value Ref Range   WBC 7.0 4.0 - 10.5 K/uL   RBC 4.41 4.22 - 5.81 MIL/uL   Hemoglobin 12.6 (L) 13.0 - 17.0 g/dL   HCT 37.5 (L) 39.0 - 52.0 %   MCV 85.0 78.0 - 100.0 fL   MCH 28.6 26.0 - 34.0 pg   MCHC 33.6 30.0 - 36.0 g/dL   RDW 16.8 (H) 11.5 - 15.5 %   Platelets 157 150 - 400 K/uL  Lipase, blood     Status: None   Collection Time: 08/19/16  3:04 PM  Result Value Ref Range   Lipase 46 11 - 51 U/L    Current Facility-Administered Medications  Medication Dose Route Frequency Provider Last Rate Last Dose  . amLODipine (NORVASC) tablet 5 mg  5 mg Oral Daily Kinnie Feil, PA-C   5 mg at 08/19/16 1703  . citalopram (CELEXA) tablet 20 mg  20 mg Oral Daily Kinnie Feil, PA-C   20 mg at 08/19/16 1703  . gabapentin (NEURONTIN) capsule 300 mg  300 mg Oral QHS Kinnie Feil, PA-C   300 mg at 08/19/16 2136  . LORazepam (ATIVAN) injection 0-4 mg  0-4 mg Intravenous Q6H Kinnie Feil, PA-C       Or  . LORazepam (ATIVAN) tablet 0-4 mg  0-4 mg Oral Q6H Kinnie Feil, PA-C   2 mg at 08/19/16 1703  .  [START ON 08/22/2016] LORazepam (ATIVAN) injection 0-4 mg  0-4 mg Intravenous Q12H Gibbons, Claudia J, PA-C       Or  . Derrill Memo ON 08/22/2016] LORazepam (ATIVAN) tablet 0-4 mg  0-4 mg Oral Q12H Gibbons, Claudia J, PA-C      . thiamine (VITAMIN B-1) tablet 100 mg  100 mg Oral Daily Carmon Sails J, PA-C   100 mg at 08/19/16 1703   Or  . thiamine (B-1) injection 100 mg  100 mg Intravenous Daily Gibbons, Claudia J, PA-C      . traZODone (DESYREL) tablet 100 mg  100 mg Oral QHS,MR X 1 Kinnie Feil, PA-C   100 mg at 08/19/16 2136   Current Outpatient Prescriptions  Medication Sig Dispense Refill  . acetaminophen (TYLENOL) 325 MG tablet Take 975 mg by mouth every 6 (six) hours as needed for mild pain.     [name] amLODipine (NORVASC) 5 MG tablet Take 1 tablet (5 mg total) by mouth daily. (Patient not taking: Reported on 08/13/2016) 30 tablet 0  . citalopram (CELEXA) 20 MG tablet Take 1 tablet (20 mg total) by mouth daily. (Patient not taking: Reported on 08/13/2016) 30 tablet 0  . gabapentin (NEURONTIN) 100 MG capsule Take 1 capsule (100 mg total) by mouth 3 (three) times daily. (Patient not taking: Reported on 08/18/2016) 90 capsule 0  . gabapentin (NEURONTIN) 300 MG capsule Take 1 capsule (300 mg  total) by mouth at bedtime. (Patient not taking: Reported on 08/13/2016) 30 capsule 0  . traZODone (DESYREL) 100 MG tablet Take 1 tablet (100 mg total) by mouth at bedtime and may repeat dose one time if needed. (Patient not taking: Reported on 08/13/2016) 30 tablet 0    Musculoskeletal: Strength & Muscle Tone: within normal limits Gait & Station: normal Patient leans: N/A  Psychiatric Specialty Exam: Physical Exam  Constitutional: He is oriented to person, place, and time. He appears well-developed and well-nourished.  HENT:  Head: Normocephalic.  Neck: Normal range of motion.  Respiratory: Effort normal.  Musculoskeletal: Normal range of motion.  Neurological: He is alert and oriented to person, place,  and time.  Psychiatric: He has a normal mood and affect. His speech is normal and behavior is normal. Judgment and thought content normal. Cognition and memory are normal.    Review of Systems  Psychiatric/Behavioral: Positive for substance abuse.  All other systems reviewed and are negative.   Blood pressure (!) 152/95, pulse 96, temperature 98.8 F (37.1 C), temperature source Oral, resp. rate 20, height _0  (1.753 m), weight 81.6 kg (180 lb), SpO2 96 %.Body mass index is 26.58 kg/m.  General Appearance: Disheveled  Eye Contact:  Good  Speech:  Normal Rate  Volume:  Normal  Mood:  Euthymic  Affect:  Congruent  Thought Process:  Coherent and Descriptions of Associations: Intact  Orientation:  Full (Time, Place, and Person)  Thought Content:  WDL and Logical  Suicidal Thoughts:  No  Homicidal Thoughts:  No  Memory:  Immediate;   Good Recent;   Good Remote;   Good  Judgement:  Fair  Insight:  Fair  Psychomotor Activity:  Normal  Concentration:  Concentration: Good and Attention Span: Good  Recall:  Good  Fund of Knowledge:  Fair  Language:  Good  Akathisia:  No  Handed:  Right  AIMS (if indicated):     Assets:  Leisure Time Physical Health Resilience Social Support  ADL's:  Intact  Cognition:  WNL  Sleep:        Treatment Plan Summary: Daily contact with patient to assess and evaluate symptoms and progress in treatment, Medication management and Plan alcohol abuse with alcohol induced mood disorder:  -Crisis stabilization -Medication management:  Ativan alcohol detox protocol implemented along with Gabapentin 300 mg TID for withdrawal symptoms.  Rx given for gabapentin and Celexa 10 mg daily for depression (decreased due to not taking it). -Individual and substance abuse counseling -Substance abuse resources  Disposition: No evidence of imminent risk to self or others at present.    Waylan Boga, NP 08/20/2016 10:25 AM  Patient seen face-to-face for psychiatric  evaluation, chart reviewed and case discussed with the physician extender and developed treatment plan. Reviewed the information documented and agree with the treatment plan. Corena Pilgrim, MD

## 2016-08-20 NOTE — [facility] Suicide Risk Assessment (Signed)
Suicide Risk Assessment  Discharge Assessment   [facility] Discharge Suicide Risk Assessment   Principal Problem: Alcohol dependence with alcohol-induced mood disorder [facility]) Discharge Diagnoses:  Patient Active Problem List   Diagnosis Date Noted  . Alcohol dependence with alcohol-induced mood disorder ([place]) [F10.24] 02/14/2016    Priority: High  . Alcohol abuse [F10.10]   . Major depressive disorder, recurrent severe without psychotic features ([place]) [F33.2] 07/06/2016  . Alcohol-induced mood disorder ([place]) [F10.94] 06/15/2016  . Seizure ([place]) [R56.9] 05/26/2016  . Tobacco abuse [Z72.0] 05/26/2016  . Homeless [Z59.0] 05/26/2016  . Essential hypertension [I10] 05/26/2016  . Severe alcohol withdrawal without perceptual disturbances without complication ([place]) [X79.390] 02/14/2016  . Alcoholic hepatitis without ascites [K70.10] 02/14/2016  . Thrombocytopenia ([place]) [D69.6] 02/14/2016    Total Time spent with patient: 45 minutes  Musculoskeletal: Strength & Muscle Tone: within normal limits Gait & Station: normal Patient leans: N/A  Psychiatric Specialty Exam: Physical Exam  Constitutional: He is oriented to person, place, and time. He appears well-developed and well-nourished.  HENT:  Head: Normocephalic.  Neck: Normal range of motion.  Respiratory: Effort normal.  Musculoskeletal: Normal range of motion.  Neurological: He is alert and oriented to person, place, and time.  Psychiatric: He has a normal mood and affect. His speech is normal and behavior is normal. Judgment and thought content normal. Cognition and memory are normal.    Review of Systems  Psychiatric/Behavioral: Positive for substance abuse.  All other systems reviewed and are negative.   Blood pressure (!) 152/95, pulse 96, temperature 98.8 F (37.1 C), temperature source Oral, resp. rate 20, height 5\' 9"  (1.753 m), weight 81.6 kg (180 lb), SpO2 96 %.Body mass index is 26.58 kg/m.  General Appearance: Disheveled   Eye Contact:  Good  Speech:  Normal Rate  Volume:  Normal  Mood:  Euthymic  Affect:  Congruent  Thought Process:  Coherent and Descriptions of Associations: Intact  Orientation:  Full (Time, Place, and Person)  Thought Content:  WDL and Logical  Suicidal Thoughts:  No  Homicidal Thoughts:  No  Memory:  Immediate;   Good Recent;   Good Remote;   Good  Judgement:  Fair  Insight:  Fair  Psychomotor Activity:  Normal  Concentration:  Concentration: Good and Attention Span: Good  Recall:  Good  Fund of Knowledge:  Fair  Language:  Good  Akathisia:  No  Handed:  Right  AIMS (if indicated):     Assets:  Leisure Time Physical Health Resilience Social Support  ADL's:  Intact  Cognition:  WNL  Sleep:       Mental Status Per Nursing Assessment::   On Admission:   Alcohol abuse with homicidal ideations  Demographic Factors:  Male and Caucasian  Loss Factors: NA  Historical Factors: NA  Risk Reduction Factors:   Sense of responsibility to family and Positive social support  Continued Clinical Symptoms:  None   Cognitive Features That Contribute To Risk:  None    Suicide Risk:  Minimal: No identifiable suicidal ideation.  Patients presenting with no risk factors but with morbid ruminations; may be classified as minimal risk based on the severity of the depressive symptoms    Plan Of Care/Follow-up recommendations:  Activity:  as tolerated Diet:  heart healhty diet  Aaria Happ, NP 08/20/2016, 10:32 AM

## 2016-08-25 NOTE — ED Provider Notes (Signed)
[facility] DEPT Provider Note   CSN: [id number] Arrival date & time: 08/17/16  2305     History   Chief Complaint Chief Complaint  Patient presents with  . Seizures    HPI Barry Moore is a 51 y.o. male.  Patient is significantly intoxicated and is an unreliable historian. Patient presents by EMS with complaint of seizure. Per EMS he reports he has had seizures in the past. Per records, the patient does not take seizure medications. He denies pain.    The history is provided by the patient and the EMS personnel. No language interpreter was used.    Past Medical History:  Diagnosis Date  . Alcoholic hepatitis   . Depression   . ETOH abuse   . Hypertension   . Seizures [facility])     Patient Active Problem List   Diagnosis Date Noted  . Alcohol abuse   . Major depressive disorder, recurrent severe without psychotic features ([place]) 07/06/2016  . Alcohol-induced mood disorder ([place]) 06/15/2016  . Seizure ([place]) 05/26/2016  . Tobacco abuse 05/26/2016  . Homeless 05/26/2016  . Essential hypertension 05/26/2016  . Alcohol dependence with alcohol-induced mood disorder ([place]) 02/14/2016  . Severe alcohol withdrawal without perceptual disturbances without complication ([place]) 18/29/9371  . Alcoholic hepatitis without ascites 02/14/2016  . Thrombocytopenia ([place]) 02/14/2016    Past Surgical History:  Procedure Laterality Date  . HERNIA REPAIR    . LEG SURGERY         Home Medications    Prior to Admission medications   Medication Sig Start Date End Date Taking? Authorizing Provider  acetaminophen (TYLENOL) 325 MG tablet Take 975 mg by mouth every 6 (six) hours as needed for mild pain.    Yes [provider]  amLODipine (NORVASC) 5 MG tablet Take 1 tablet (5 mg total) by mouth daily. Patient not taking: Reported on 08/13/2016 07/13/16   Kerrie Buffalo, NP  citalopram (CELEXA) 20 MG tablet Take 0.5 tablets (10 mg total) by mouth daily. 08/20/16   Patrecia Pour, NP   gabapentin (NEURONTIN) 100 MG capsule Take 1 capsule (100 mg total) by mouth 3 (three) times daily. Patient not taking: Reported on 08/18/2016 08/14/16   Rankin, Shuvon B, NP  gabapentin (NEURONTIN) 300 MG capsule Take 1 capsule (300 mg total) by mouth at bedtime. Patient not taking: Reported on 08/13/2016 07/12/16   Kerrie Buffalo, NP  gabapentin (NEURONTIN) 300 MG capsule Take 1 capsule (300 mg total) by mouth 3 (three) times daily. 08/20/16   Patrecia Pour, NP  traZODone (DESYREL) 100 MG tablet Take 1 tablet (100 mg total) by mouth at bedtime and may repeat dose one time if needed. Patient not taking: Reported on 08/13/2016 07/12/16   Kerrie Buffalo, NP    Family History Family History  Problem Relation Age of Onset  . Diabetes Mother   . Alcoholism Mother   . Emphysema Father   . Lung cancer Father   . Alcoholism Father     Social History Social History  Substance Use Topics  . Smoking status: Current Every Day Smoker    Packs/day: 2.00    Types: Cigarettes  . Smokeless tobacco: Never Used  . Alcohol use Yes     Comment: 5th liquor and one Forty daily       Allergies   Aspirin   Review of Systems Review of Systems  Reason unable to perform ROS: Significantly intoxicated.     Physical Exam Updated Vital Signs BP (!) 124/93  Pulse 80   Temp 97.5 F (36.4 C) (Oral)   Resp 18   SpO2 95%   Physical Exam  Constitutional: He appears well-developed and well-nourished.  Acutely intoxicated. Disheveled.  HENT:  Head: Normocephalic.  Neck: Normal range of motion. Neck supple.  Cardiovascular: Normal rate and regular rhythm.   Pulmonary/Chest: Effort normal and breath sounds normal.  Abdominal: Soft. Bowel sounds are normal. There is no tenderness. There is no rebound and no guarding.  Musculoskeletal: Normal range of motion.  Skin: Skin is warm and dry. No rash noted.  Psychiatric: He has a normal mood and affect.     ED Treatments / Results  Labs (all labs  ordered are listed, but only abnormal results are displayed) Labs Reviewed  BASIC METABOLIC PANEL - Abnormal; Notable for the following:       Result Value   Glucose, Bld 107 (*)    Calcium 8.7 (*)    All other components within normal limits  CBC WITH DIFFERENTIAL/PLATELET - Abnormal; Notable for the following:    RBC 4.19 (*)    Hemoglobin 12.1 (*)    HCT 36.3 (*)    RDW 16.8 (*)    All other components within normal limits  ETHANOL - Abnormal; Notable for the following:    Alcohol, Ethyl (B) 462 (*)    All other components within normal limits  CBG MONITORING, ED    EKG  EKG Interpretation None       Radiology No results found.  Procedures Procedures (including critical care time)  Medications Ordered in ED Medications - No data to display   Initial Impression / Assessment and Plan / ED Course  I have reviewed the triage vital signs and the nursing notes.  Pertinent labs & imaging results that were available during my care of the patient were reviewed by me and considered in my medical decision making (see chart for details).     Patient presents via EMS with complaint of seizures. On arrival the patient is acutely intoxicated and reports he cannot remember why he is here.  He is observed over several hours. No seizure activity in the ED.   He remains easily awakened on multiple rechecks through the night. On final morning re-evaluation:  The patient is more alert. He is eating and drinking. He remembers that EMS was called due to seizure because he had an aura. Labs are unremarkable with the exception of a BA of 462 at 11:45 pm. He denies SI/HI/AVH.   He can be discharged home. He has been ambulatory and is steady.   Final Clinical Impressions(s) / ED Diagnoses   Final diagnoses:  Alcohol abuse    New Prescriptions Discharge Medication List as of 08/18/2016 10:00 AM       Charlann Lange, PA-C 97/53/00 5110    Delora Fuel, MD 21/11/73 1453

## 2016-09-27 ENCOUNTER — Encounter (HOSPITAL_COMMUNITY): Payer: Self-pay | Admitting: Emergency Medicine

## 2016-09-27 ENCOUNTER — Emergency Department (HOSPITAL_COMMUNITY)
Admission: EM | Admit: 2016-09-27 | Discharge: 2016-09-28 | Disposition: A | Discharge status: Home / Self Care | Payer: Self-pay | Attending: Emergency Medicine | Admitting: Emergency Medicine

## 2016-09-27 DIAGNOSIS — Z9114 Patient's other noncompliance with medication regimen: Secondary | ICD-10-CM | POA: Insufficient documentation

## 2016-09-27 DIAGNOSIS — F17210 Nicotine dependence, cigarettes, uncomplicated: Secondary | ICD-10-CM | POA: Insufficient documentation

## 2016-09-27 DIAGNOSIS — I10 Essential (primary) hypertension: Secondary | ICD-10-CM | POA: Insufficient documentation

## 2016-09-27 DIAGNOSIS — Z5900 Homelessness unspecified: Secondary | ICD-10-CM

## 2016-09-27 DIAGNOSIS — F1910 Other psychoactive substance abuse, uncomplicated: Secondary | ICD-10-CM | POA: Insufficient documentation

## 2016-09-27 DIAGNOSIS — Z79899 Other long term (current) drug therapy: Secondary | ICD-10-CM | POA: Insufficient documentation

## 2016-09-27 DIAGNOSIS — F10920 Alcohol use, unspecified with intoxication, uncomplicated: Secondary | ICD-10-CM | POA: Insufficient documentation

## 2016-09-27 LAB — RAPID URINE DRUG SCREEN, HOSP PERFORMED
AMPHETAMINES: NOT DETECTED
BARBITURATES: NOT DETECTED
Benzodiazepines: NOT DETECTED
Cocaine: NOT DETECTED
Opiates: NOT DETECTED
Tetrahydrocannabinol: NOT DETECTED

## 2016-09-27 LAB — COMPREHENSIVE METABOLIC PANEL
ALBUMIN: 4.1 g/dL (ref 3.5–5.0)
ALK PHOS: 131 U/L — AB (ref 38–126)
ALT: 116 U/L — ABNORMAL HIGH (ref 17–63)
ANION GAP: 12 (ref 5–15)
AST: 328 U/L — ABNORMAL HIGH (ref 15–41)
BUN: <5 mg/dL — ABNORMAL LOW (ref 6–20)
CALCIUM: 8.6 mg/dL — AB (ref 8.9–10.3)
CO2: 23 mmol/L (ref 22–32)
Chloride: 102 mmol/L (ref 101–111)
Creatinine, Ser: 0.8 mg/dL (ref 0.61–1.24)
GFR calc non Af Amer: >60 mL/min (ref >60)
GLUCOSE: 98 mg/dL (ref 65–99)
Potassium: 4 mmol/L (ref 3.5–5.1)
Sodium: 137 mmol/L (ref 135–145)
TOTAL PROTEIN: 7.5 g/dL (ref 6.5–8.1)
Total Bilirubin: 0.8 mg/dL (ref 0.3–1.2)

## 2016-09-27 LAB — CBC WITH DIFFERENTIAL/PLATELET
Basophils Absolute: 0 10*3/uL (ref 0.0–0.1)
Basophils Relative: 1 %
Eosinophils Absolute: 0.1 10*3/uL (ref 0.0–0.7)
Eosinophils Relative: 3 %
HEMATOCRIT: 36.7 % — AB (ref 39.0–52.0)
Hemoglobin: 12.8 g/dL — ABNORMAL LOW (ref 13.0–17.0)
LYMPHS PCT: 30 %
Lymphs Abs: 1.1 10*3/uL (ref 0.7–4.0)
MCH: 30.1 pg (ref 26.0–34.0)
MCHC: 34.9 g/dL (ref 30.0–36.0)
MCV: 86.4 fL (ref 78.0–100.0)
MONO ABS: 0.6 10*3/uL (ref 0.1–1.0)
MONOS PCT: 17 %
NEUTROS ABS: 1.8 10*3/uL (ref 1.7–7.7)
Neutrophils Relative %: 50 %
Platelets: 84 10*3/uL — ABNORMAL LOW (ref 150–400)
RBC: 4.25 MIL/uL (ref 4.22–5.81)
RDW: 16.3 % — AB (ref 11.5–15.5)
WBC: 3.7 10*3/uL — ABNORMAL LOW (ref 4.0–10.5)

## 2016-09-27 LAB — URINALYSIS, ROUTINE W REFLEX MICROSCOPIC
BILIRUBIN URINE: NEGATIVE
Glucose, UA: NEGATIVE mg/dL
Hgb urine dipstick: NEGATIVE
Ketones, ur: NEGATIVE mg/dL
Leukocytes, UA: NEGATIVE
NITRITE: NEGATIVE
PROTEIN: NEGATIVE mg/dL
SPECIFIC GRAVITY, URINE: 1.004 — AB (ref 1.005–1.030)
pH: 6 (ref 5.0–8.0)

## 2016-09-27 LAB — PROTIME-INR
INR: 0.97
Prothrombin Time: 12.9 seconds (ref 11.4–15.2)

## 2016-09-27 LAB — MAGNESIUM: Magnesium: 2.3 mg/dL (ref 1.7–2.4)

## 2016-09-27 LAB — ETHANOL: Alcohol, Ethyl (B): 404 mg/dL (ref <5)

## 2016-09-27 MED ORDER — VITAMIN B-1 100 MG PO TABS: 100.0000 mg | ORAL_TABLET | Freq: Every day | ORAL | Status: DC

## 2016-09-27 MED ORDER — SODIUM CHLORIDE 0.9 % IV SOLN
INTRAVENOUS | Status: DC
  Administered 2016-09-27: 22:00:00 via INTRAVENOUS

## 2016-09-27 MED ORDER — CHLORDIAZEPOXIDE HCL 25 MG PO CAPS: ORAL_CAPSULE | ORAL | 0 refills | Status: DC

## 2016-09-27 MED ORDER — SODIUM CHLORIDE 0.9 % IV BOLUS (SEPSIS)
1000.0000 mL | Freq: Once | INTRAVENOUS | Status: AC
  Administered 2016-09-27: 1000 mL via INTRAVENOUS

## 2016-09-27 MED ORDER — LORAZEPAM 1 MG PO TABS: 0.0000 mg | ORAL_TABLET | Freq: Two times a day (BID) | ORAL | Status: DC

## 2016-09-27 MED ORDER — LORAZEPAM 2 MG/ML IJ SOLN
0.0000 mg | Freq: Four times a day (QID) | INTRAMUSCULAR | Status: DC
  Administered 2016-09-27: 2 mg via INTRAVENOUS
  Administered 2016-09-28: 1 mg via INTRAVENOUS
  Filled 2016-09-27 (×2): qty 1

## 2016-09-27 MED ORDER — LORAZEPAM 2 MG/ML IJ SOLN: 0.0000 mg | Freq: Two times a day (BID) | INTRAMUSCULAR | Status: DC

## 2016-09-27 MED ORDER — THIAMINE HCL 100 MG/ML IJ SOLN
100.0000 mg | Freq: Every day | INTRAMUSCULAR | Status: DC
  Administered 2016-09-27: 100 mg via INTRAVENOUS
  Filled 2016-09-27: qty 2

## 2016-09-27 MED ORDER — LORAZEPAM 1 MG PO TABS: 0.0000 mg | ORAL_TABLET | Freq: Four times a day (QID) | ORAL | Status: DC

## 2016-09-27 NOTE — ED Notes (Signed)
Bed: WA07 Expected date:  Expected time:  Means of arrival:  Comments: EMS seizure 

## 2016-09-27 NOTE — ED Provider Notes (Signed)
[facility] DEPT Provider Note   CSN: [id number] Arrival date & time: 09/27/16  0240     History   Chief Complaint Chief Complaint  Patient presents with  . Seizures  . Alcohol Intoxication    HPI Barry Moore is a 51 y.o. male.  Pt presents to the ED today for evaluation of frequent seizures.  The pt said that he drinks a lot and used cocaine yesterday.  He has a hx of seizures.  He has not taken any of his meds in over a month because he does not have them.  He is homeless and has no Insurance underwriter.  He denies any injury with his seizures.  He was not incontinent.  The pt has not stopped drinking and last drank right before EMS arrived.      Past Medical History:  Diagnosis Date  . Alcoholic hepatitis   . Depression   . ETOH abuse   . Hypertension   . Seizures [facility])     Patient Active Problem List   Diagnosis Date Noted  . Alcohol abuse   . Major depressive disorder, recurrent severe without psychotic features ([place]) 07/06/2016  . Alcohol-induced mood disorder ([place]) 06/15/2016  . Seizure ([place]) 05/26/2016  . Tobacco abuse 05/26/2016  . Homeless 05/26/2016  . Essential hypertension 05/26/2016  . Alcohol dependence with alcohol-induced mood disorder ([place]) 02/14/2016  . Severe alcohol withdrawal without perceptual disturbances without complication ([place]) 97/35/3299  . Alcoholic hepatitis without ascites 02/14/2016  . Thrombocytopenia ([place]) 02/14/2016    Past Surgical History:  Procedure Laterality Date  . HERNIA REPAIR    . LEG SURGERY         Home Medications    Prior to Admission medications   Medication Sig Start Date End Date Taking? Authorizing Provider  citalopram (CELEXA) 20 MG tablet Take 0.5 tablets (10 mg total) by mouth daily. 08/20/16  Yes Patrecia Pour, NP  gabapentin (NEURONTIN) 300 MG capsule Take 1 capsule (300 mg total) by mouth 3 (three) times daily. 08/20/16  Yes Patrecia Pour, NP  acetaminophen (TYLENOL) 325 MG tablet Take 975 mg  by mouth every 6 (six) hours as needed for mild pain.     [provider]  amLODipine (NORVASC) 5 MG tablet Take 1 tablet (5 mg total) by mouth daily. Patient not taking: Reported on 08/13/2016 07/13/16   Kerrie Buffalo, NP  chlordiazePOXIDE (LIBRIUM) 25 MG capsule 50mg  PO TID x 1D, then 25-50mg  PO BID X 1D, then 25-50mg  PO QD X 1D 09/27/16   Isla Pence, MD  gabapentin (NEURONTIN) 100 MG capsule Take 1 capsule (100 mg total) by mouth 3 (three) times daily. Patient not taking: Reported on 08/18/2016 08/14/16   Rankin, Shuvon B, NP  gabapentin (NEURONTIN) 300 MG capsule Take 1 capsule (300 mg total) by mouth at bedtime. Patient not taking: Reported on 08/13/2016 07/12/16   Kerrie Buffalo, NP  traZODone (DESYREL) 100 MG tablet Take 1 tablet (100 mg total) by mouth at bedtime and may repeat dose one time if needed. Patient not taking: Reported on 08/13/2016 07/12/16   Kerrie Buffalo, NP    Family History Family History  Problem Relation Age of Onset  . Diabetes Mother   . Alcoholism Mother   . Emphysema Father   . Lung cancer Father   . Alcoholism Father     Social History Social History  Substance Use Topics  . Smoking status: Current Every Day Smoker    Packs/day: 2.00    Types: Cigarettes  .  Smokeless tobacco: Never Used  . Alcohol use Yes     Comment: 5th liquor and one Forty daily       Allergies   Aspirin   Review of Systems Review of Systems  Neurological: Positive for seizures.  All other systems reviewed and are negative.    Physical Exam Updated Vital Signs BP 109/68   Pulse 81   Temp 98.2 F (36.8 C) (Oral)   Resp 19   Ht 5\' 10"  (1.778 m)   Wt 77.1 kg (170 lb)   SpO2 93%   BMI 24.39 kg/m   Physical Exam  Constitutional: He is oriented to person, place, and time. He appears well-developed and well-nourished.  HENT:  Head: Normocephalic and atraumatic.  Right Ear: External ear normal.  Left Ear: External ear normal.  Nose: Nose normal.    Mouth/Throat: Oropharynx is clear and moist.  Eyes: Conjunctivae and EOM are normal. Pupils are equal, round, and reactive to light.  Neck: Normal range of motion. Neck supple.  Cardiovascular: Normal rate, regular rhythm, normal heart sounds and intact distal pulses.   Pulmonary/Chest: Effort normal and breath sounds normal.  Abdominal: Soft. Bowel sounds are normal.  Musculoskeletal: Normal range of motion.  Neurological: He is alert and oriented to person, place, and time.  Skin: Skin is warm.  Psychiatric: He has a normal mood and affect. His behavior is normal. Thought content normal.  Nursing note and vitals reviewed.    ED Treatments / Results  Labs (all labs ordered are listed, but only abnormal results are displayed) Labs Reviewed  CBC WITH DIFFERENTIAL/PLATELET - Abnormal; Notable for the following:       Result Value   WBC 3.7 (*)    Hemoglobin 12.8 (*)    HCT 36.7 (*)    RDW 16.3 (*)    Platelets 84 (*)    All other components within normal limits  COMPREHENSIVE METABOLIC PANEL - Abnormal; Notable for the following:    BUN <5 (*)    Calcium 8.6 (*)    AST 328 (*)    ALT 116 (*)    Alkaline Phosphatase 131 (*)    All other components within normal limits  ETHANOL - Abnormal; Notable for the following:    Alcohol, Ethyl (B) 404 (*)    All other components within normal limits  URINALYSIS, ROUTINE W REFLEX MICROSCOPIC - Abnormal; Notable for the following:    Specific Gravity, Urine 1.004 (*)    All other components within normal limits  MAGNESIUM  PROTIME-INR  RAPID URINE DRUG SCREEN, HOSP PERFORMED  CBG MONITORING, ED    EKG  EKG Interpretation  Date/Time:  Monday September 27 2016 19:38:38 EDT Ventricular Rate:  93 PR Interval:    QRS Duration: 98 QT Interval:  343 QTC Calculation: 427 R Axis:   73 Text Interpretation:  Sinus rhythm ST elev, probable normal early repol pattern Confirmed by Isla Pence [phone]) on 09/27/2016 7:57:13 PM        Radiology No results found.  Procedures Procedures (including critical care time)  Medications Ordered in ED Medications  sodium chloride 0.9 % bolus 1,000 mL (0 mLs Intravenous Stopped 09/27/16 2134)    And  0.9 %  sodium chloride infusion ( Intravenous New Bag/Given 09/27/16 2135)  LORazepam (ATIVAN) injection 0-4 mg (2 mg Intravenous Given 09/27/16 2002)    Or  LORazepam (ATIVAN) tablet 0-4 mg ( Oral See Alternative 09/27/16 2002)  LORazepam (ATIVAN) injection 0-4 mg (not administered)  Or  LORazepam (ATIVAN) tablet 0-4 mg (not administered)  thiamine (VITAMIN B-1) tablet 100 mg ( Oral See Alternative 09/27/16 2002)    Or  thiamine (B-1) injection 100 mg (100 mg Intravenous Given 09/27/16 2002)     Initial Impression / Assessment and Plan / ED Course  I have reviewed the triage vital signs and the nursing notes.  Pertinent labs & imaging results that were available during my care of the patient were reviewed by me and considered in my medical decision making (see chart for details).  Pt has been asleep since he was given his ativan.  Plan is to watch until clinically sober, then he can be discharged.  He will be given a rx to [facility] and a resource guide for substance abuse treatment.  Final Clinical Impressions(s) / ED Diagnoses   Final diagnoses:  Alcoholic intoxication without complication ([place])  Noncompliance with medications  Polysubstance abuse  Homeless    New Prescriptions New Prescriptions   CHLORDIAZEPOXIDE (LIBRIUM) 25 MG CAPSULE    50mg  PO TID x 1D, then 25-50mg  PO BID X 1D, then 25-50mg  PO QD X 1D     Isla Pence, MD 09/27/16 2333

## 2016-09-27 NOTE — ED Notes (Signed)
Dr.Haviland notified of pt CIWA score of 14.

## 2016-09-27 NOTE — ED Notes (Signed)
Pt laying in bed having episodes of tremors but able to follow commands and stops having tremors after being redirected. Seizure pads applied.

## 2016-09-27 NOTE — ED Triage Notes (Signed)
Pt presents by EMS for evaluation of frequent seizures. Pt reports that he has not taken any medications in over a month. Pt reports to daily drinking and used cocaine yesterday. Pt currently alert and oriented x 4 at this time. EMS reported no seizure activity during transport.

## 2016-09-28 NOTE — ED Notes (Signed)
No seizure activity noted since arrival. Pt currently laying in bed no acute distress.

## 2016-09-28 NOTE — ED Provider Notes (Signed)
5:35 AM Patient now awake and ambulatory. CIWA score is 4.     Jewelene Mairena, Jenny Reichmann, MD 09/28/16 [phone]

## 2016-09-28 NOTE — ED Notes (Signed)
Pt was able to ambulate in hallway without difficulty.

## 2016-09-28 NOTE — ED Notes (Signed)
Pt given crackers and gingerale.

## 2016-10-04 ENCOUNTER — Encounter (HOSPITAL_COMMUNITY): Payer: Self-pay

## 2016-10-04 ENCOUNTER — Emergency Department (HOSPITAL_COMMUNITY)
Admission: EM | Admit: 2016-10-04 | Discharge: 2016-10-05 | Disposition: A | Discharge status: Home / Self Care | Payer: Self-pay | Attending: Emergency Medicine | Admitting: Emergency Medicine

## 2016-10-04 DIAGNOSIS — Z79899 Other long term (current) drug therapy: Secondary | ICD-10-CM | POA: Insufficient documentation

## 2016-10-04 DIAGNOSIS — F17210 Nicotine dependence, cigarettes, uncomplicated: Secondary | ICD-10-CM | POA: Insufficient documentation

## 2016-10-04 DIAGNOSIS — F1010 Alcohol abuse, uncomplicated: Secondary | ICD-10-CM | POA: Insufficient documentation

## 2016-10-04 DIAGNOSIS — R112 Nausea with vomiting, unspecified: Secondary | ICD-10-CM | POA: Insufficient documentation

## 2016-10-04 DIAGNOSIS — R197 Diarrhea, unspecified: Secondary | ICD-10-CM | POA: Insufficient documentation

## 2016-10-04 DIAGNOSIS — R109 Unspecified abdominal pain: Secondary | ICD-10-CM | POA: Insufficient documentation

## 2016-10-04 DIAGNOSIS — I10 Essential (primary) hypertension: Secondary | ICD-10-CM | POA: Insufficient documentation

## 2016-10-04 NOTE — ED Triage Notes (Signed)
Pt here via EMS with complaints of generalized pain that began today. Pt told EMS he has been drinking etoh today as well.

## 2016-10-05 LAB — COMPREHENSIVE METABOLIC PANEL
ALBUMIN: 3.8 g/dL (ref 3.5–5.0)
ALT: 120 U/L — ABNORMAL HIGH (ref 17–63)
ANION GAP: 10 (ref 5–15)
AST: 253 U/L — ABNORMAL HIGH (ref 15–41)
Alkaline Phosphatase: 146 U/L — ABNORMAL HIGH (ref 38–126)
BILIRUBIN TOTAL: 0.3 mg/dL (ref 0.3–1.2)
BUN: 5 mg/dL — ABNORMAL LOW (ref 6–20)
CO2: 24 mmol/L (ref 22–32)
Calcium: 8.6 mg/dL — ABNORMAL LOW (ref 8.9–10.3)
Chloride: 107 mmol/L (ref 101–111)
Creatinine, Ser: 0.69 mg/dL (ref 0.61–1.24)
GFR calc non Af Amer: >60 mL/min (ref >60)
GLUCOSE: 100 mg/dL — AB (ref 65–99)
POTASSIUM: 3.4 mmol/L — AB (ref 3.5–5.1)
SODIUM: 141 mmol/L (ref 135–145)
TOTAL PROTEIN: 7.1 g/dL (ref 6.5–8.1)

## 2016-10-05 LAB — CBC WITH DIFFERENTIAL/PLATELET
BASOS PCT: 2 %
Basophils Absolute: 0.1 10*3/uL (ref 0.0–0.1)
EOS ABS: 0.2 10*3/uL (ref 0.0–0.7)
Eosinophils Relative: 4 %
HCT: 34.7 % — ABNORMAL LOW (ref 39.0–52.0)
Hemoglobin: 11.7 g/dL — ABNORMAL LOW (ref 13.0–17.0)
Lymphocytes Relative: 35 %
Lymphs Abs: 1.4 10*3/uL (ref 0.7–4.0)
MCH: 30.2 pg (ref 26.0–34.0)
MCHC: 33.7 g/dL (ref 30.0–36.0)
MCV: 89.7 fL (ref 78.0–100.0)
MONO ABS: 0.8 10*3/uL (ref 0.1–1.0)
MONOS PCT: 20 %
Neutro Abs: 1.6 10*3/uL — ABNORMAL LOW (ref 1.7–7.7)
Neutrophils Relative %: 39 %
Platelets: 141 10*3/uL — ABNORMAL LOW (ref 150–400)
RBC: 3.87 MIL/uL — ABNORMAL LOW (ref 4.22–5.81)
RDW: 15.9 % — AB (ref 11.5–15.5)
WBC: 4 10*3/uL (ref 4.0–10.5)

## 2016-10-05 LAB — URINALYSIS, ROUTINE W REFLEX MICROSCOPIC
Bilirubin Urine: NEGATIVE
Glucose, UA: NEGATIVE mg/dL
Hgb urine dipstick: NEGATIVE
KETONES UR: NEGATIVE mg/dL
LEUKOCYTES UA: NEGATIVE
NITRITE: NEGATIVE
PH: 5 (ref 5.0–8.0)
Protein, ur: NEGATIVE mg/dL
Specific Gravity, Urine: 1.005 (ref 1.005–1.030)

## 2016-10-05 LAB — LIPASE, BLOOD: Lipase: 53 U/L — ABNORMAL HIGH (ref 11–51)

## 2016-10-05 MED ORDER — ONDANSETRON 4 MG PO TBDP: 4.0000 mg | ORAL_TABLET | Freq: Three times a day (TID) | ORAL | 0 refills | Status: DC | PRN

## 2016-10-05 MED ORDER — ONDANSETRON HCL 4 MG/2ML IJ SOLN
4.0000 mg | Freq: Once | INTRAMUSCULAR | Status: AC
  Administered 2016-10-05: 4 mg via INTRAVENOUS
  Filled 2016-10-05: qty 2

## 2016-10-05 MED ORDER — SODIUM CHLORIDE 0.9 % IV BOLUS (SEPSIS)
1000.0000 mL | Freq: Once | INTRAVENOUS | Status: AC
  Administered 2016-10-05: 1000 mL via INTRAVENOUS

## 2016-10-05 NOTE — ED Provider Notes (Signed)
[facility] DEPT Provider Note   CSN: [id number] Arrival date & time: 10/04/16  2223     History   Chief Complaint Chief Complaint  Patient presents with  . Pain    generalized   . Alcohol Intoxication    HPI Barry Moore is a 51 y.o. male.  HPI   Patient is a 51 year old male with history of hypertension, seizures, alcohol abuse, alcoholic hepatitis who presents to the ED via EMS with complaint of abdominal pain. Patient reports having intermittent mid abdominal pain for the past 2 days with associated nausea and NBNB vomiting. He reports having chronic nonbloody diarrhea at baseline which is unchanged. Denies any other associated symptoms. Patient notes he has been drinking alcohol today, reports drinking 240 ounce beers. Denies fever, chills, chest pain, shortness of breath, hematemesis, hematochezia, urinary symptoms, hematuria, flank pain, rectal bleeding. Denies taking any medications for his symptoms. Denies any known sick contacts. Denies any recent antibiotics. Abdominal surgical history includes hernia repair.  Past Medical History:  Diagnosis Date  . Alcoholic hepatitis   . Depression   . ETOH abuse   . Hypertension   . Seizures [facility])     Patient Active Problem List   Diagnosis Date Noted  . Alcohol abuse   . Major depressive disorder, recurrent severe without psychotic features ([place]) 07/06/2016  . Alcohol-induced mood disorder ([place]) 06/15/2016  . Seizure ([place]) 05/26/2016  . Tobacco abuse 05/26/2016  . Homeless 05/26/2016  . Essential hypertension 05/26/2016  . Alcohol dependence with alcohol-induced mood disorder ([place]) 02/14/2016  . Severe alcohol withdrawal without perceptual disturbances without complication ([place]) 23/76/2831  . Alcoholic hepatitis without ascites 02/14/2016  . Thrombocytopenia ([place]) 02/14/2016    Past Surgical History:  Procedure Laterality Date  . HERNIA REPAIR    . LEG SURGERY         Home Medications    Prior to  Admission medications   Medication Sig Start Date End Date Taking? Authorizing Provider  citalopram (CELEXA) 20 MG tablet Take 0.5 tablets (10 mg total) by mouth daily. 08/20/16  Yes Patrecia Pour, NP  gabapentin (NEURONTIN) 300 MG capsule Take 1 capsule (300 mg total) by mouth 3 (three) times daily. 08/20/16  Yes Patrecia Pour, NP  chlordiazePOXIDE (LIBRIUM) 25 MG capsule 6m PO TID x 1D, then 25-566mPO BID X 1D, then 25-5061mO QD X 1D Patient not taking: Reported on 10/04/2016 09/27/16   HavIsla PenceD  ondansetron (ZOFRAN ODT) 4 MG disintegrating tablet Take 1 tablet (4 mg total) by mouth every 8 (eight) hours as needed for nausea or vomiting. 10/05/16   NadNona DellA-C    Family History Family History  Problem Relation Age of Onset  . Diabetes Mother   . Alcoholism Mother   . Emphysema Father   . Lung cancer Father   . Alcoholism Father     Social History Social History  Substance Use Topics  . Smoking status: Current Every Day Smoker    Packs/day: 2.00    Types: Cigarettes  . Smokeless tobacco: Never Used  . Alcohol use Yes     Comment: 5th liquor and one Forty daily       Allergies   Aspirin   Review of Systems Review of Systems  Gastrointestinal: Positive for abdominal pain, nausea and vomiting. Constipation: chronic.  All other systems reviewed and are negative.    Physical Exam Updated Vital Signs BP 124/80   Pulse 72   Temp 98.1 F (36.7 C) (  Oral)   Resp 18   SpO2 96%   Physical Exam  Constitutional: He is oriented to person, place, and time. He appears well-developed and well-nourished.  Pt appears discheveled  HENT:  Head: Normocephalic and atraumatic.  Mouth/Throat: Uvula is midline, oropharynx is clear and moist and mucous membranes are normal. No oropharyngeal exudate, posterior oropharyngeal edema, posterior oropharyngeal erythema or tonsillar abscesses. No tonsillar exudate.  Eyes: Conjunctivae and EOM are normal. Right eye  exhibits no discharge. Left eye exhibits no discharge. No scleral icterus.  Neck: Normal range of motion. Neck supple.  Cardiovascular: Normal rate, regular rhythm, normal heart sounds and intact distal pulses.   Pulmonary/Chest: Effort normal and breath sounds normal. No respiratory distress. He has no wheezes. He has no rales. He exhibits no tenderness.  Abdominal: Soft. Normal appearance and bowel sounds are normal. He exhibits no distension and no mass. There is tenderness. There is no rigidity, no rebound, no guarding and no CVA tenderness. No hernia.  Mild diffuse mid and lower abdominal tenderness  Musculoskeletal: Normal range of motion. He exhibits no edema.  Neurological: He is alert and oriented to person, place, and time.  Skin: Skin is warm and dry. He is not diaphoretic.  Nursing note and vitals reviewed.    ED Treatments / Results  Labs (all labs ordered are listed, but only abnormal results are displayed) Labs Reviewed  CBC WITH DIFFERENTIAL/PLATELET - Abnormal; Notable for the following:       Result Value   RBC 3.87 (*)    Hemoglobin 11.7 (*)    HCT 34.7 (*)    RDW 15.9 (*)    Platelets 141 (*)    Neutro Abs 1.6 (*)    All other components within normal limits  COMPREHENSIVE METABOLIC PANEL - Abnormal; Notable for the following:    Potassium 3.4 (*)    Glucose, Bld 100 (*)    BUN 5 (*)    Calcium 8.6 (*)    AST 253 (*)    ALT 120 (*)    Alkaline Phosphatase 146 (*)    All other components within normal limits  LIPASE, BLOOD - Abnormal; Notable for the following:    Lipase 53 (*)    All other components within normal limits  URINALYSIS, ROUTINE W REFLEX MICROSCOPIC - Abnormal; Notable for the following:    Color, Urine STRAW (*)    All other components within normal limits    EKG  EKG Interpretation None       Radiology No results found.  Procedures Procedures (including critical care time)  Medications Ordered in ED Medications  sodium  chloride 0.9 % bolus 1,000 mL (0 mLs Intravenous Stopped 10/05/16 0300)  ondansetron (ZOFRAN) injection 4 mg (4 mg Intravenous Given 10/05/16 0353)     Initial Impression / Assessment and Plan / ED Course  I have reviewed the triage vital signs and the nursing notes.  Pertinent labs & imaging results that were available during my care of the patient were reviewed by me and considered in my medical decision making (see chart for details).     Patient presents with mid abdominal pain with associated nausea and vomiting for the past 2 days. Endorses drinking alcohol. VSS. Exam revealed mild tenderness over mid and lower abdomen, no peritoneal signs. Remaining exam unremarkable. Patient given IV fluids and Zofran in the ED. Labs showed elevated LFTs; AST 253, ALT 120, Alk Phos 146. Chart review shows pt with chronically elevated LFTs; today's labs appear improved  most prior ED visit on 09/27/16 for alcohol intoxication. Remaining labs and UA unremarkable. Suspect pt's sxs may be due to gastritis from alcohol abuse or viral gastritis. On repeat exam patient does not have a surgical abdomin and there are no peritoneal signs. Tolerating PO. No indication of appendicitis, bowel obstruction, bowel perforation, cholecystitis, diverticulitis.  Patient discharged home with symptomatic treatment and given strict instructions for follow-up with their primary care physician.  I have also discussed reasons to return immediately to the ER.  Patient expresses understanding and agrees with plan.     Final Clinical Impressions(s) / ED Diagnoses   Final diagnoses:  Alcohol abuse  Abdominal pain, unspecified abdominal location  Non-intractable vomiting with nausea, unspecified vomiting type    New Prescriptions New Prescriptions   ONDANSETRON (ZOFRAN ODT) 4 MG DISINTEGRATING TABLET    Take 1 tablet (4 mg total) by mouth every 8 (eight) hours as needed for nausea or vomiting.     Nona Dell,  PA-C 43/71/90 [id number]    Delora Fuel, MD 17/11/65 [phone]

## 2016-10-05 NOTE — ED Notes (Addendum)
Pt tolerated oral intake well.  

## 2016-10-05 NOTE — Discharge Instructions (Signed)
Take your medication as prescribed as needed for nausea. I recommend refraining from drinking alcohol. Please follow up with a primary care provider from the Resource Guide provided below in 4-5 days as needed. Please return to the Emergency Department if symptoms worsen or new onset of fever, chest pain, difficulty breathing, new/worsening abdominal pain, vomiting, blood, unable to keep fluids down, blood in stool, urinary symptoms.

## 2016-10-14 ENCOUNTER — Encounter (HOSPITAL_COMMUNITY): Payer: Self-pay

## 2016-10-14 ENCOUNTER — Emergency Department (HOSPITAL_COMMUNITY)
Admission: EM | Admit: 2016-10-14 | Discharge: 2016-10-15 | Disposition: A | Discharge status: Home / Self Care | Payer: Self-pay | Attending: Emergency Medicine | Admitting: Emergency Medicine

## 2016-10-14 DIAGNOSIS — F10920 Alcohol use, unspecified with intoxication, uncomplicated: Secondary | ICD-10-CM | POA: Insufficient documentation

## 2016-10-14 DIAGNOSIS — R45850 Homicidal ideations: Secondary | ICD-10-CM | POA: Insufficient documentation

## 2016-10-14 DIAGNOSIS — R440 Auditory hallucinations: Secondary | ICD-10-CM | POA: Insufficient documentation

## 2016-10-14 DIAGNOSIS — K529 Noninfective gastroenteritis and colitis, unspecified: Secondary | ICD-10-CM

## 2016-10-14 DIAGNOSIS — I10 Essential (primary) hypertension: Secondary | ICD-10-CM | POA: Insufficient documentation

## 2016-10-14 DIAGNOSIS — R45851 Suicidal ideations: Secondary | ICD-10-CM | POA: Insufficient documentation

## 2016-10-14 DIAGNOSIS — R7401 Elevation of levels of liver transaminase levels: Secondary | ICD-10-CM

## 2016-10-14 DIAGNOSIS — R740 Nonspecific elevation of levels of transaminase and lactic acid dehydrogenase [LDH]: Secondary | ICD-10-CM | POA: Insufficient documentation

## 2016-10-14 DIAGNOSIS — Z720 Tobacco use: Secondary | ICD-10-CM

## 2016-10-14 DIAGNOSIS — Z5900 Homelessness unspecified: Secondary | ICD-10-CM

## 2016-10-14 DIAGNOSIS — F339 Major depressive disorder, recurrent, unspecified: Secondary | ICD-10-CM | POA: Insufficient documentation

## 2016-10-14 DIAGNOSIS — R112 Nausea with vomiting, unspecified: Secondary | ICD-10-CM | POA: Insufficient documentation

## 2016-10-14 DIAGNOSIS — R197 Diarrhea, unspecified: Secondary | ICD-10-CM | POA: Insufficient documentation

## 2016-10-14 DIAGNOSIS — F17210 Nicotine dependence, cigarettes, uncomplicated: Secondary | ICD-10-CM | POA: Insufficient documentation

## 2016-10-14 DIAGNOSIS — Z79899 Other long term (current) drug therapy: Secondary | ICD-10-CM | POA: Insufficient documentation

## 2016-10-14 DIAGNOSIS — F1010 Alcohol abuse, uncomplicated: Secondary | ICD-10-CM

## 2016-10-14 DIAGNOSIS — D649 Anemia, unspecified: Secondary | ICD-10-CM | POA: Insufficient documentation

## 2016-10-14 LAB — CBC WITH DIFFERENTIAL/PLATELET
Basophils Absolute: 0.1 10*3/uL (ref 0.0–0.1)
Basophils Relative: 2 %
Eosinophils Absolute: 0.2 10*3/uL (ref 0.0–0.7)
Eosinophils Relative: 5 %
HEMATOCRIT: 36.4 % — AB (ref 39.0–52.0)
HEMOGLOBIN: 12.1 g/dL — AB (ref 13.0–17.0)
LYMPHS ABS: 1.6 10*3/uL (ref 0.7–4.0)
LYMPHS PCT: 34 %
MCH: 29.5 pg (ref 26.0–34.0)
MCHC: 33.2 g/dL (ref 30.0–36.0)
MCV: 88.8 fL (ref 78.0–100.0)
MONOS PCT: 20 %
Monocytes Absolute: 0.9 10*3/uL (ref 0.1–1.0)
NEUTROS ABS: 1.9 10*3/uL (ref 1.7–7.7)
NEUTROS PCT: 39 %
Platelets: 213 10*3/uL (ref 150–400)
RBC: 4.1 MIL/uL — AB (ref 4.22–5.81)
RDW: 15 % (ref 11.5–15.5)
WBC: 4.7 10*3/uL (ref 4.0–10.5)

## 2016-10-14 LAB — COMPREHENSIVE METABOLIC PANEL
ALT: 128 U/L — AB (ref 17–63)
AST: 202 U/L — ABNORMAL HIGH (ref 15–41)
Albumin: 4 g/dL (ref 3.5–5.0)
Alkaline Phosphatase: 110 U/L (ref 38–126)
Anion gap: 11 (ref 5–15)
BUN: <5 mg/dL — ABNORMAL LOW (ref 6–20)
CHLORIDE: 104 mmol/L (ref 101–111)
CO2: 24 mmol/L (ref 22–32)
CREATININE: 0.83 mg/dL (ref 0.61–1.24)
Calcium: 8.4 mg/dL — ABNORMAL LOW (ref 8.9–10.3)
Glucose, Bld: 93 mg/dL (ref 65–99)
Potassium: 3.5 mmol/L (ref 3.5–5.1)
Sodium: 139 mmol/L (ref 135–145)
Total Bilirubin: 0.6 mg/dL (ref 0.3–1.2)
Total Protein: 7 g/dL (ref 6.5–8.1)

## 2016-10-14 LAB — ETHANOL: ALCOHOL ETHYL (B): 380 mg/dL — AB (ref <5)

## 2016-10-14 LAB — SALICYLATE LEVEL

## 2016-10-14 LAB — ACETAMINOPHEN LEVEL: Acetaminophen (Tylenol), Serum: <10 ug/mL — ABNORMAL LOW (ref 10–30)

## 2016-10-14 LAB — LIPASE, BLOOD: Lipase: 58 U/L — ABNORMAL HIGH (ref 11–51)

## 2016-10-14 LAB — POC OCCULT BLOOD, ED: FECAL OCCULT BLD: NEGATIVE

## 2016-10-14 MED ORDER — LORAZEPAM 2 MG/ML IJ SOLN
0.0000 mg | Freq: Four times a day (QID) | INTRAMUSCULAR | Status: DC
Start: 2016-10-14 — End: 2016-10-15
  Administered 2016-10-14: 1 mg via INTRAVENOUS
  Filled 2016-10-14: qty 1

## 2016-10-14 MED ORDER — M.V.I. ADULT IV INJ
INJECTION | Freq: Once | INTRAVENOUS | Status: AC
  Administered 2016-10-14: via INTRAVENOUS
  Filled 2016-10-14: qty 1000

## 2016-10-14 MED ORDER — THIAMINE HCL 100 MG/ML IJ SOLN
100.0000 mg | Freq: Every day | INTRAMUSCULAR | Status: DC
  Administered 2016-10-14: 100 mg via INTRAVENOUS
  Filled 2016-10-14: qty 2

## 2016-10-14 MED ORDER — LORAZEPAM 2 MG/ML IJ SOLN
0.0000 mg | Freq: Two times a day (BID) | INTRAMUSCULAR | Status: DC
Start: 2016-10-17 — End: 2016-10-15

## 2016-10-14 MED ORDER — LORAZEPAM 1 MG PO TABS
0.0000 mg | ORAL_TABLET | Freq: Four times a day (QID) | ORAL | Status: DC
Start: 2016-10-14 — End: 2016-10-15
  Administered 2016-10-15: 1 mg via ORAL
  Filled 2016-10-14: qty 1
  Filled 2016-10-14: qty 4

## 2016-10-14 MED ORDER — LORAZEPAM 1 MG PO TABS
0.0000 mg | ORAL_TABLET | Freq: Two times a day (BID) | ORAL | Status: DC
  Administered 2016-10-15: 4 mg via ORAL

## 2016-10-14 MED ORDER — VITAMIN B-1 100 MG PO TABS
100.0000 mg | ORAL_TABLET | Freq: Every day | ORAL | Status: DC
  Administered 2016-10-15: 100 mg via ORAL
  Filled 2016-10-14: qty 1

## 2016-10-14 MED ORDER — ONDANSETRON HCL 4 MG/2ML IJ SOLN
4.0000 mg | Freq: Once | INTRAMUSCULAR | Status: AC
  Administered 2016-10-14: 4 mg via INTRAVENOUS
  Filled 2016-10-14: qty 2

## 2016-10-14 NOTE — ED Notes (Signed)
Pt appears to be intoxicated stating "I just want to die".

## 2016-10-14 NOTE — ED Triage Notes (Signed)
Pt arrived via [facility] c/o seizure activity tonight, pt states he drank more then usual endorses at least 4-40oz's. N/V/D. And states that he is SI and HI.

## 2016-10-14 NOTE — ED Provider Notes (Signed)
[facility] DEPT Provider Note   CSN: [id number] Arrival date & time: 10/14/16  2158     History   Chief Complaint Chief Complaint  Patient presents with  . Seizures  . Alcohol Intoxication  . Suicidal  . Homicidal    HPI Lehman Whiteley is a 51 y.o. male with a PMHx of alcohol abuse, alcoholic hepatitis, depression, HTN, seizures, thrombocytopenia, chronic diarrhea, and homelessness, who presents to the ED via EMS with complaints of SI, HI, and alcohol intoxication. LEVEL 5 CAVEAT DUE TO INTOXICATION. Patient states "I want to die", when asked if he has a plan he states that his knife was taken away from him so "that's out". He states that if he had access to his knife he would cut himself. He endorses having thoughts of hurting other people by "using my hands" but is very vague and does not give further details. He admits to drinking approximately three or four 40oz beers today. He also complains of nausea and vomiting 1 week, approximately 3 episodes daily of nonbloody nonbilious emesis, however as the initial evaluation comes to an end, his first request is "something cold to drink". He states that his epigastric area is also sore from vomiting. He mentions that he had one episode of bright red blood on the toilet paper when he wiped, no further episodes of rectal bleeding, and states that it was painless. He states that he has chronic diarrhea which is unchanged. Lastly he mentions that earlier this morning he heard voices talking to him "hollering my name". He denies visual hallucinations or drug use. Admits to being a cigarette smoker. He states he doesn't take any of his medications because he cannot afford them[place] and does not see a primary care doctor. Chart review reveals that he has been seen in the ED several times in the last few months, usually for intoxication/abd pain/n/v/d/psych issues, with the most recent ED visit on 10/04/16 (for c/o abd pain/n/v and  intoxicated). He denies rectal pain, fevers, urinary complaints, or any other complaints at this time, although intoxicated state limits evaluation slightly.    The history is provided by the patient and medical records. No language interpreter was used.  Alcohol Intoxication  This is a chronic problem. The current episode started more than 1 week ago. The problem occurs constantly. The problem has not changed since onset.Associated symptoms include abdominal pain. The symptoms are aggravated by drinking. Nothing relieves the symptoms. He has tried nothing for the symptoms. The treatment provided no relief.    Past Medical History:  Diagnosis Date  . Alcoholic hepatitis   . Depression   . ETOH abuse   . Hypertension   . Seizures [facility])     Patient Active Problem List   Diagnosis Date Noted  . Alcohol abuse   . Major depressive disorder, recurrent severe without psychotic features ([place]) 07/06/2016  . 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 Seizure ([place]) 05/26/2016  . Tobacco abuse 05/26/2016  . Homeless 05/26/2016  . Essential hypertension 05/26/2016  . Alcohol dependence with alcohol-induced mood disorder ([place]) 02/14/2016  . Severe alcohol withdrawal without perceptual disturbances without complication ([place]) 62/83/6629  . Alcoholic hepatitis without ascites 02/14/2016  . Thrombocytopenia ([place]) 02/14/2016    Past Surgical History:  Procedure Laterality Date  . HERNIA REPAIR    . LEG SURGERY         Home Medications    Prior to Admission medications   Medication Sig Start Date End  Date Taking? Authorizing Provider  chlordiazePOXIDE (LIBRIUM) 25 MG capsule 50mg  PO TID x 1D, then 25-50mg  PO BID X 1D, then 25-50mg  PO QD X 1D Patient not taking: Reported on 10/04/2016 09/27/16   Isla Pence, MD  citalopram (CELEXA) 20 MG tablet Take 0.5 tablets (10 mg total) by mouth daily. 08/20/16   Patrecia Pour, NP  gabapentin (NEURONTIN) 300 MG capsule Take 1 capsule (300 mg  total) by mouth 3 (three) times daily. 08/20/16   Patrecia Pour, NP  ondansetron (ZOFRAN ODT) 4 MG disintegrating tablet Take 1 tablet (4 mg total) by mouth every 8 (eight) hours as needed for nausea or vomiting. 10/05/16   Nona Dell, PA-C    Family History Family History  Problem Relation Age of Onset  . Diabetes Mother   . Alcoholism Mother   . Emphysema Father   . Lung cancer Father   . Alcoholism Father     Social History Social History  Substance Use Topics  . Smoking status: Current Every Day Smoker    Packs/day: 2.00    Types: Cigarettes  . Smokeless tobacco: Never Used  . Alcohol use Yes     Comment: 5th liquor and one Forty daily       Allergies   Aspirin   Review of Systems Review of Systems  Unable to perform ROS: Other  Constitutional: Negative for fever.  Gastrointestinal: Positive for abdominal pain, anal bleeding, diarrhea (chronic and unchanged), nausea and vomiting. Negative for rectal pain.  Genitourinary: Negative for dysuria and hematuria.  Neurological: Positive for seizures.  Psychiatric/Behavioral: Positive for hallucinations and suicidal ideas.   Level 5 caveat due to intoxication  Physical Exam Updated Vital Signs BP 127/86 (BP Location: Right Arm)   Pulse 92   Temp 98.2 F (36.8 C) (Oral)   Resp (!) 23   Ht 5\' 7"  (1.702 m)   Wt 81.6 kg (180 lb)   SpO2 96%   BMI 28.19 kg/m   Physical Exam  Constitutional: He is oriented to person, place, and time. Vital signs are normal. He appears well-developed and well-nourished.  Non-toxic appearance. No distress.  Afebrile, nontoxic, NAD, disheveled, clearly intoxicated, smells of alcohol, seems more interested in watching TV than discussing his complaints  HENT:  Head: Normocephalic and atraumatic.  Mouth/Throat: Oropharynx is clear and moist and mucous membranes are normal.  Eyes: Conjunctivae and EOM are normal. Right eye exhibits no discharge. Left eye exhibits no discharge.    Neck: Normal range of motion. Neck supple.  Cardiovascular: Normal rate, regular rhythm, normal heart sounds and intact distal pulses.  Exam reveals no gallop and no friction rub.   No murmur heard. Pulmonary/Chest: Effort normal and breath sounds normal. No respiratory distress. He has no decreased breath sounds. He has no wheezes. He has no rhonchi. He has no rales.  Abdominal: Soft. Normal appearance and bowel sounds are normal. He exhibits no distension. There is generalized tenderness. There is no rigidity, no rebound, no guarding, no CVA tenderness, no tenderness at McBurney's point and negative Murphy's sign.  Soft, nondistended, +BS throughout, with mild diffuse abd TTP without focal area of tenderness, no r/g/r, neg murphy's, neg mcburney's, no CVA TTP   Genitourinary: Prostate normal. Rectal exam shows external hemorrhoid. Rectal exam shows no internal hemorrhoid, no fissure, no mass, no tenderness, anal tone normal and guaiac negative stool.  Genitourinary Comments: Chaperone present No gross blood noted on rectal exam, normal tone, no tenderness, no mass or fissure, small old  ext hemorrhoidal skin tags, but no active/thrombosed hemorrhoids. FOBT neg Prostate without enlargement, warmth, tenderness, or bogginess.   Musculoskeletal: Normal range of motion.  Neurological: He is alert and oriented to person, place, and time. He has normal strength. No sensory deficit.  Skin: Skin is warm, dry and intact. No rash noted.  Psychiatric: He is actively hallucinating. He exhibits a depressed mood. He expresses homicidal and suicidal ideation. He expresses suicidal plans and homicidal plans.  Clearly intoxicated. Depressed affect. Endorsing SI and HI with vague plans for both, reports auditory hallucinations without visual hallucinations but doesn't seem to be responding to internal stimuli.   Nursing note and vitals reviewed.  23:31 CIWA Alcohol Scale BF  CIWA-Ar  BP: 124/89  Pulse Rate: 89   Nausea and Vomiting: mild nausea with no vomiting  Tactile Disturbances: none  Tremor: two  Auditory Disturbances: not present  Paroxysmal Sweats: no sweat visible  Visual Disturbances: not present  Anxiety: mildly anxious  Headache, Fullness in Head: very mild  Agitation: somewhat more than normal activity  Orientation and Clouding of Sensorium: cannot do serial additions or is uncertain about date  CIWA-Ar Total: 7     ED Treatments / Results  Labs (all labs ordered are listed, but only abnormal results are displayed) Labs Reviewed  COMPREHENSIVE METABOLIC PANEL - Abnormal; Notable for the following:       Result Value   BUN <5 (*)    Calcium 8.4 (*)    AST 202 (*)    ALT 128 (*)    All other components within normal limits  ETHANOL - Abnormal; Notable for the following:    Alcohol, Ethyl (B) 380 (*)    All other components within normal limits  ACETAMINOPHEN LEVEL - Abnormal; Notable for the following:    Acetaminophen (Tylenol), Serum <10 (*)    All other components within normal limits  LIPASE, BLOOD - Abnormal; Notable for the following:    Lipase 58 (*)    All other components within normal limits  CBC WITH DIFFERENTIAL/PLATELET - Abnormal; Notable for the following:    RBC 4.10 (*)    Hemoglobin 12.1 (*)    HCT 36.4 (*)    All other components within normal limits  SALICYLATE LEVEL  URINALYSIS, ROUTINE W REFLEX MICROSCOPIC  RAPID URINE DRUG SCREEN, HOSP PERFORMED  POC OCCULT BLOOD, ED    EKG  EKG Interpretation None       Radiology No results found.  Procedures Procedures (including critical care time)  Medications Ordered in ED Medications  LORazepam (ATIVAN) injection 0-4 mg (0 mg Intravenous Hold 10/15/16 0416)    Or  LORazepam (ATIVAN) tablet 0-4 mg ( Oral See Alternative 10/15/16 0416)  LORazepam (ATIVAN) injection 0-4 mg (not administered)    Or  LORazepam (ATIVAN) tablet 0-4 mg (not administered)  thiamine (VITAMIN B-1) tablet 100 mg (  Oral See Alternative 10/14/16 2336)    Or  thiamine (B-1) injection 100 mg (100 mg Intravenous Given 10/14/16 2336)  citalopram (CELEXA) tablet 10 mg (not administered)  gabapentin (NEURONTIN) capsule 300 mg (not administered)  zolpidem (AMBIEN) tablet 5 mg (not administered)  ondansetron (ZOFRAN) tablet 4 mg (not administered)  alum & mag hydroxide-simeth (MAALOX/MYLANTA) 200-200-20 MG/5ML suspension 30 mL (not administered)  nicotine (NICODERM CQ - dosed in mg/24 hours) patch 21 mg (not administered)  ondansetron (ZOFRAN) injection 4 mg (4 mg Intravenous Given 10/14/16 2336)  sodium chloride 0.9 % 1,000 mL with thiamine 782 mg, folic acid 1 mg,  multivitamins adult 10 mL infusion ( Intravenous Stopped 10/14/16 2352)     Initial Impression / Assessment and Plan / ED Course  I have reviewed the triage vital signs and the nursing notes.  Pertinent labs & imaging results that were available during my care of the patient were reviewed by me and considered in my medical decision making (see chart for details).     51 y.o. male here with c/o SI, HI, and intoxication. Also c/o N/V/epigastric soreness x1wk. Reports chronic unchanged diarrhea. Also states BRBPR x1 episode this morning, and hearing voices saying his name this morning. Clearly intoxicated, and level 5 caveat applies. Pt seen in ED 10 days ago for n/v/d/abd pain complaints, and has had multiple ED visits in the last several months, mostly for EtOH abuse. On exam, diffuse abd TTP without focal area of tenderness, nonperitoneal; appears intoxicated, disheveled; more interested in watching TV than talking to me. Rectal exam with old hemorrhoidal skin tags but no active hemorrhoids, no gross blood, no tenderness. Will get labs, CIWA score, FOBT, and give banana bag of fluids and zofran, then reassess shortly.   5:05 AM EtOH level 380. CBC w/diff with chronic baseline anemia. CMP with baseline transaminitis which is similar to prior visits.  Lipase marginally elevated at 58, doubt acute pancreatitis at this value; could actually reflect chronic pancreatitis.  FOBT neg. Salicylate and acetaminophen levels WNL. CIWA score 7. U/A unremarkable. UDS pending, but does not interfere with med clearance. Pt feeling improved after meds, tolerating PO well, and has no ongoing medical complaints at this time. Pt medically cleared at this time. Psych hold orders, CIWA orders, and home med orders placed. Please see TTS notes for further documentation of care/dispo. PLEASE NOTE THAT PT IS HERE VOLUNTARILY AT THIS TIME, IF PT TRIES TO LEAVE THEY WOULD NEED REASSESSMENT TO SEE IF HE WOULD NEED TO HAVE IVC PAPERWORK TAKEN OUT. Pt stable at time of med clearance.  Advised importance of EtOH and tobacco cessation.   Final Clinical Impressions(s) / ED Diagnoses   Final diagnoses:  Suicidal ideation  Homicidal ideation  Alcoholic intoxication without complication ([place])  Alcohol abuse  Tobacco user  Nausea and vomiting in adult patient  Chronic diarrhea  Homelessness  Transaminitis  Chronic anemia    New Prescriptions New Prescriptions   No medications on [address], [place], [place] 10/15/16 0505    Little, Wenda Overland, MD 10/15/16 1255

## 2016-10-15 LAB — URINALYSIS, ROUTINE W REFLEX MICROSCOPIC
BILIRUBIN URINE: NEGATIVE
Glucose, UA: NEGATIVE mg/dL
HGB URINE DIPSTICK: NEGATIVE
Ketones, ur: NEGATIVE mg/dL
Leukocytes, UA: NEGATIVE
Nitrite: NEGATIVE
Protein, ur: NEGATIVE mg/dL
Specific Gravity, Urine: 1.006 (ref 1.005–1.030)
pH: 5 (ref 5.0–8.0)

## 2016-10-15 LAB — RAPID URINE DRUG SCREEN, HOSP PERFORMED
AMPHETAMINES: NOT DETECTED
BENZODIAZEPINES: NOT DETECTED
Barbiturates: NOT DETECTED
Cocaine: POSITIVE — AB
OPIATES: NOT DETECTED
Tetrahydrocannabinol: NOT DETECTED

## 2016-10-15 MED ORDER — NICOTINE 21 MG/24HR TD PT24
21.0000 mg | MEDICATED_PATCH | Freq: Every day | TRANSDERMAL | Status: DC
  Administered 2016-10-15: 21 mg via TRANSDERMAL
  Filled 2016-10-15: qty 1

## 2016-10-15 MED ORDER — ZOLPIDEM TARTRATE 5 MG PO TABS: 5.0000 mg | ORAL_TABLET | Freq: Every evening | ORAL | Status: DC | PRN

## 2016-10-15 MED ORDER — ONDANSETRON HCL 4 MG PO TABS
4.0000 mg | ORAL_TABLET | Freq: Three times a day (TID) | ORAL | Status: DC | PRN
  Administered 2016-10-15: 4 mg via ORAL
  Filled 2016-10-15: qty 1

## 2016-10-15 MED ORDER — CITALOPRAM HYDROBROMIDE 10 MG PO TABS
10.0000 mg | ORAL_TABLET | Freq: Every day | ORAL | Status: DC
  Administered 2016-10-15: 10 mg via ORAL
  Filled 2016-10-15: qty 1

## 2016-10-15 MED ORDER — ALUM & MAG HYDROXIDE-SIMETH 200-200-20 MG/5ML PO SUSP: 30.0000 mL | Freq: Four times a day (QID) | ORAL | Status: DC | PRN

## 2016-10-15 MED ORDER — GABAPENTIN 300 MG PO CAPS
300.0000 mg | ORAL_CAPSULE | Freq: Three times a day (TID) | ORAL | Status: DC
  Administered 2016-10-15: 300 mg via ORAL
  Filled 2016-10-15: qty 1

## 2016-10-15 NOTE — ED Provider Notes (Signed)
Pt presented to the ED yesterday with alcohol intoxication.  He told the provider that he had some suicidal and homicidal ideations.  He was kept overnight for observation.  He denies any specific plans for suicide or homicide.  The pt was reevaluated this afternoon and does not meet inpatient criteria.  The pt is in agreement for outpatient tx.  The pt is able to contract for safety.  I also reexamined him.  He has no current plan for suicide or homicide.  He will be provided with resources for outpatient treatment.   Isla Pence, MD 10/15/16 [phone]

## 2016-10-15 NOTE — ED Notes (Signed)
Lunch tray ordered; regular diet 

## 2016-10-15 NOTE — ED Notes (Signed)
TTS in process 

## 2016-10-15 NOTE — ED Notes (Signed)
Pt given [facility] pamphlett as well as a print out of Substance / Alcohol abuse resouces. Pt verbalized understanding of follow up instructions with [facility]. Pt also given a [place] sandwich, sprite, apple sauce, saltine crackers, graham crackers with peanut butter and a pack of cookies at d/c. Pt also given a bus pass. Pt wheeled out of ED via wheelchair by this RN and pt ambulated to bus stop with independent steady gait.

## 2016-10-15 NOTE — Progress Notes (Signed)
Per Hughie Closs, NP, patient does not meet criteria for inpatient treatment. Patient is recommended for discharge and to follow up with outpatient provider and SA treatment.   CSW spoke to [facility] admissions for possible beds, and per [facility] they do not have any available beds at this time.   CSW faxed available SA treatment resources to patient's nurse, Doylene Bode, RN at [phone]).   [facility] accepts walk-ins, Monday - Friday from 8:30am - 5:00pm.   Doylene Bode, RN, notified and agreed to provide [facility] resources to patient at discharge.    Radonna Ricker MSW, [facility] Disposition [phone]

## 2016-10-15 NOTE — ED Notes (Signed)
This RN informed Dr. Gilford Raid that assessment team that they are recommending d/c.

## 2016-10-15 NOTE — ED Notes (Signed)
TTS Complete 

## 2016-10-15 NOTE — BH Assessment (Addendum)
Tele Assessment Note   Barry Moore is an 51 y.o.separated male, who voluntarily came into [facility]. Patient reported having suicidal ideations, with a plan to cut himself with a knife on his wrists. Patient reported having access to knives.  Patient stated that he is currently having homicidal ideations, without a plan, against an individual that is currently incarcerated.  Patient reports daily use of alcohol and occasional use of Cannabis.  Patient stated that he frequently experiences blackouts when use alcohol.  Per Labs (10/14/2016), Patient's Ethanol Level was 380 mg/dL. Patient reported experiences with auditory hallucinations of someone yelling his name.  Patient reported experiences with depressive symptoms, such as fatigue, insomnia, isolation, tearfulness, feelings of worthlessness, guilt, loss of interest in previously enjoyable activities, and recurrent thoughts of wanting to hurt identified individuals. Patient denies VH or self-injurious behaviors.   Patient reported currently being homeless and staying in a tent in the woods. Patient identified "life" as his current stressors.  Patient reported being homeless and separated from his wife. Patient reported being arrested in 2014 for grand larceny.   Patient reported receiving past inpatient treatment at [facility] and [facility] for substance abuse, depression, and suicidal ideations, however not following through with discharge recommendations from both facilities.  Patient reported currently receiving no outpatient treatment from providers.  Patient reported having a period of sobriety for 1 year.    During assessment, Patient was calm and cooperative.  Patient was dressed in scrubs and laying in his bed.  Patient was oriented to the time, person, location, and situation.  Patient's eye contact was good and exhibited freedom of movement.  Patient's speech was logical and coherent.  Patient thought process appeared to be coherent, relevant, and  circumstantial, while judgement appeared to be unimpaired.  Patient stated that he wanted to receive inpatient treatment.    Diagnosis: Major depressive disorder, recurrent, severe with psychotic features Alcohol Use Disorder Cannabis Use Disorder  Per Lindon Romp, NP: AM psych evaluation recommended.   Past Medical History:  Past Medical History:  Diagnosis Date  . Alcoholic hepatitis   . Depression   . ETOH abuse   . Hypertension   . Seizures ([place])     Past Surgical History:  Procedure Laterality Date  . HERNIA REPAIR    . LEG SURGERY      Family History:  Family History  Problem Relation Age of Onset  . Diabetes Mother   . Alcoholism Mother   . Emphysema Father   . Lung cancer Father   . Alcoholism Father     Social History:  reports that he has been smoking Cigarettes.  He has been smoking about 2.00 packs per day. He has never used smokeless tobacco. He reports that he drinks alcohol. He reports that he uses drugs, including Cocaine.  Additional Social History:  Alcohol / Drug Use Pain Medications: Patient denies Prescriptions: Patient denies Over the Counter: Patient denies History of alcohol / drug use?: Yes Substance #1 Name of Substance 1: Alcohol 1 - Age of First Use: 11  1 - Amount (size/oz): 3-4 40oz 1 - Frequency: Daily  1 - Duration: Ongoing 1 - Last Use / Amount: 10/14/2016 Substance #2 Name of Substance 2: Cocaine  2 - Age of First Use: 25 2 - Amount (size/oz):  Various  2 - Frequency: Unknown 2 - Duration: Ongoing 2 - Last Use / Amount: 10/14/2016  CIWA: CIWA-Ar BP: 110/75 Pulse Rate: 73 Nausea and Vomiting: no nausea and no vomiting Tactile Disturbances:  none Tremor: not visible, but can be felt fingertip to fingertip Auditory Disturbances: not present Paroxysmal Sweats: no sweat visible Visual Disturbances: not present Anxiety: no anxiety, at ease Headache, Fullness in Head: none present Agitation: somewhat more than normal  activity Orientation and Clouding of Sensorium: oriented and can do serial additions CIWA-Ar Total: 2 COWS:    PATIENT STRENGTHS: (choose at least two) Ability for insight Average or above average intelligence Capable of independent living Communication skills General fund of knowledge Motivation for treatment/growth  Allergies:  Allergies  Allergen Reactions  . Aspirin Nausea And Vomiting    Home Medications:  (Not in a hospital admission)  OB/GYN Status:  No LMP for male patient.  General Assessment Data Location of Assessment: [facility] ED TTS Assessment: In system Is this a Tele or Face-to-Face Assessment?: Tele Assessment Is this an Initial Assessment or a Re-assessment for this encounter?: Initial Assessment Marital status: Separated Maiden name: N/A Is patient pregnant?: No Pregnancy Status: No Living Arrangements: Alone Can pt return to current living arrangement?: Yes Admission Status: Voluntary Is patient capable of signing voluntary admission?: Yes Referral Source: Self/Family/Friend Insurance type: None     Crisis Care Plan Living Arrangements: Alone Legal Guardian: Other: (Self) Name of Psychiatrist: None Name of Therapist: None  Education Status Is patient currently in school?: No Current Grade: N/A Highest grade of school patient has completed: N/A Name of school: N/A Contact person: NA  Risk to self with the past 6 months Suicidal Ideation: Yes-Currently Present Has patient been a risk to self within the past 6 months prior to admission? : No Suicidal Intent: Yes-Currently Present Has patient had any suicidal intent within the past 6 months prior to admission? : No Is patient at risk for suicide?: Yes Suicidal Plan?: Yes-Currently Present Has patient had any suicidal plan within the past 6 months prior to admission? : No Specify Current Suicidal Plan: Pt. reports having plan to cut himself on the wrists with a knife. Access to Means:  Yes Specify Access to Suicidal Means: Pt.  reports having access to knives What has been your use of drugs/alcohol within the last 12 months?: Alcohol and Cannabis Previous Attempts/Gestures: Yes How many times?: 0 Other Self Harm Risks: Patient denies  Triggers for Past Attempts: Other (Comment), Unknown, Unpredictable (Patient reported being unable to idenitify past triggers) Intentional Self Injurious Behavior: None Family Suicide History: No Recent stressful life event(s): Other (Comment) Persecutory voices/beliefs?: No Depression: Yes Depression Symptoms: Insomnia, Tearfulness, Isolating, Fatigue, Guilt, Loss of interest in usual pleasures, Feeling worthless/self pity, Feeling angry/irritable Substance abuse history and/or treatment for substance abuse?: Yes Suicide prevention information given to non-admitted patients: Not applicable  Risk to Others within the past 6 months Homicidal Ideation: Yes-Currently Present Does patient have any lifetime risk of violence toward others beyond the six months prior to admission? : Yes (comment) Thoughts of Harm to Others: Yes-Currently Present Comment - Thoughts of Harm to Others: Patient to harm an individual that is currently incarcerated. Current Homicidal Intent: No-Not Currently/Within Last 6 Months Current Homicidal Plan: No Describe Current Homicidal Plan: Patient reports having no plan Access to Homicidal Means: Yes Describe Access to Homicidal Means: Patient reports having access to knives Identified Victim: Patient reports thoughts of harms an unknown individual History of harm to others?: No (Patient denies) Assessment of Violence: On admission Violent Behavior Description: Patient reported having no identified plan Does patient have access to weapons?: Yes (Comment) (Patient reported having access to knives) Criminal Charges Pending?: No (  Patient denies) Describe Pending Criminal Charges: Patient denies Does patient have a  court date: No (Patient denies) Is patient on probation?: No  Psychosis Hallucinations: Auditory Delusions: None noted  Mental Status Report Appearance/Hygiene: In scrubs Eye Contact: Good Motor Activity: Freedom of movement, Unremarkable Speech: Logical/coherent Level of Consciousness: Alert Mood: Depressed, Worthless, low self-esteem Affect: Appropriate to circumstance, Depressed Anxiety Level: None Thought Processes: Coherent, Relevant, Circumstantial Judgement: Unimpaired Orientation: Person, Place, Time, Situation Obsessive Compulsive Thoughts/Behaviors: None  Cognitive Functioning Concentration: Good Memory: Recent Impaired, Remote Intact IQ: Average Insight: Fair Impulse Control: Poor Appetite: Fair Weight Loss: 15 (Per Pt. "A couple of months") Weight Gain: 0 Sleep: Decreased Total Hours of Sleep: 3 Vegetative Symptoms: None  ADLScreening [facility] Assessment Services) Patient's cognitive ability adequate to safely complete daily activities?: Yes Patient able to express need for assistance with ADLs?: Yes Independently performs ADLs?: Yes (appropriate for developmental age)  Prior Inpatient Therapy Prior Inpatient Therapy: Yes Prior Therapy Dates: 2018, 2017 Prior Therapy Facilty/Provider(s): [facility] Reason for Treatment: SI, SA issues  Prior Outpatient Therapy Prior Outpatient Therapy: No Prior Therapy Dates: NA Prior Therapy Facilty/Provider(s): NA Reason for Treatment: NA Does patient have an ACCT team?: No Does patient have Intensive In-House Services?  : No Does patient have [facility] services? : No Does patient have P4CC services?: No  ADL Screening (condition at time of admission) Patient's cognitive ability adequate to safely complete daily activities?: Yes Is the patient deaf or have difficulty hearing?: No Does the patient have difficulty seeing, even when wearing glasses/contacts?: No Does the patient have difficulty concentrating,  remembering, or making decisions?: No Patient able to express need for assistance with ADLs?: Yes Does the patient have difficulty dressing or bathing?: No Independently performs ADLs?: Yes (appropriate for developmental age) Does the patient have difficulty walking or climbing stairs?: No Weakness of Legs: None Weakness of Arms/Hands: None  Home Assistive Devices/Equipment Home Assistive Devices/Equipment: None    Abuse/Neglect Assessment (Assessment to be complete while patient is alone) Physical Abuse: Denies Verbal Abuse: Denies Sexual Abuse: Denies Exploitation of patient/patient's resources: Denies Self-Neglect: Denies     Regulatory affairs officer (For Healthcare) Does Patient Have a Medical Advance Directive?: No Would patient like information on creating a medical advance directive?: No - Patient declined    Additional Information 1:1 In Past 12 Months?: No CIRT Risk: No Elopement Risk: No Does patient have medical clearance?: No (Pending updated Lab results)     Disposition:  Disposition Initial Assessment Completed for this Encounter: Yes (Per Lindon Romp, NP) Disposition of Patient:  (AM psych evaluation) Other disposition(s): Other (Comment) (AM psych evaluation)  Marcine Matar 10/15/2016 6:33 AM

## 2016-10-15 NOTE — Consult Note (Signed)
Telepsych Consultation   Reason for Consult: Depression and alcohol use disorder Referring Physician: EDP Patient Identification: Barry Moore MRN:  [id number] Principal Diagnosis: <principal problem not specified> Diagnosis:   Patient Active Problem List   Diagnosis Date Noted  . Alcohol abuse [F10.10]   . Major depressive disorder, recurrent severe without psychotic features ([place]) [F33.2] 07/06/2016  . Alcohol-induced mood disorder ([place]) [F10.94] 06/15/2016  . Seizure ([place]) [R56.9] 05/26/2016  . Tobacco abuse [Z72.0] 05/26/2016  . Homeless [Z59.0] 05/26/2016  . Essential hypertension [I10] 05/26/2016  . Alcohol dependence with alcohol-induced mood disorder ([place]) [F10.24] 02/14/2016  . Severe alcohol withdrawal without perceptual disturbances without complication ([place]) [B15.176] 02/14/2016  . Alcoholic hepatitis without ascites [K70.10] 02/14/2016  . Thrombocytopenia ([place]) [D69.6] 02/14/2016    Total Time spent with patient: 30 minutes  Subjective:   Barry Moore is a 51 y.o. male patient admitted with Major depressive disorder, recurrent, severe with psychotic features. Alcohol Use Disorder. Cannabis Use Disorder.  HPI: Per the assessment completed earlier today by Leroy Sea: Barry Moore is an 51 y.o.separated male, who voluntarily came into [facility]. Patient reported having suicidal ideations, with a plan to cut himself with a knife on his wrists. Patient reported having access to knives.  Patient stated that he is currently having homicidal ideations, without a plan, against an individual that is currently incarcerated.  Patient reports daily use of alcohol and occasional use of Cannabis.  Patient stated that he frequently experiences blackouts when use alcohol.  Per Labs (10/14/2016), Patient's Ethanol Level was 380 mg/dL. Patient reported experiences with auditory hallucinations of someone yelling his name.  Patient reported experiences with depressive symptoms, such as  fatigue, insomnia, isolation, tearfulness, feelings of worthlessness, guilt, loss of interest in previously enjoyable activities, and recurrent thoughts of wanting to hurt identified individuals. Patient denies VH or self-injurious behaviors.   Patient reported currently being homeless and staying in a tent in the woods. Patient identified "life" as his current stressors.  Patient reported being homeless and separated from his wife. Patient reported being arrested in 2014 for grand larceny.   Patient reported receiving past inpatient treatment at [facility] and [facility] for substance abuse, depression, and suicidal ideations, however not following through with discharge recommendations from both facilities.  Patient reported currently receiving no outpatient treatment from providers.  Patient reported having a period of sobriety for 1 year.    During assessment, Patient was calm and cooperative.  Patient was dressed in scrubs and laying in his bed.  Patient was oriented to the time, person, location, and situation.  Patient's eye contact was good and exhibited freedom of movement.  Patient's speech was logical and coherent.  Patient thought process appeared to be coherent, relevant, and circumstantial, while judgement appeared to be unimpaired.  Patient stated that he wanted to receive inpatient treatment.   On Exam: Patient was seen, chart reviewed with treatment team. Patient was in bed, awake, alert and oriented x4. Patient reiterated the reason for this hospital admission as documented above. Patient stated, "I'm still depressed and somewhat having SI but I have no plans at this time". Patient also stating that he has HI towards his ex girl friend for abandoning him but he denies any plan also. Patient currently denies any [facility] stating that his current stressor is living in the wood for the past 1 week. Patient stated he hasn't been on any medications for sometime now. He admits to substance use but stated that  he wants to get help  and agrees to follow up with OP resources this time. Patient was able to contract for safety.     Past Psychiatric History: See H&P  Risk to Self: Suicidal Ideation: Yes-Currently Present Suicidal Intent: Yes-Currently Present Is patient at risk for suicide?: Yes Suicidal Plan?: Yes-Currently Present Specify Current Suicidal Plan: Pt. reports having plan to cut himself on the wrists with a knife. Access to Means: Yes Specify Access to Suicidal Means: Pt.  reports having access to knives What has been your use of drugs/alcohol within the last 12 months?: Alcohol and Cannabis How many times?: 0 Other Self Harm Risks: Patient denies  Triggers for Past Attempts: Other (Comment), Unknown, Unpredictable (Patient reported being unable to idenitify past triggers) Intentional Self Injurious Behavior: None Risk to Others: Homicidal Ideation: Yes-Currently Present Thoughts of Harm to Others: Yes-Currently Present Comment - Thoughts of Harm to Others: Patient to harm an individual that is currently incarcerated. Current Homicidal Intent: No-Not Currently/Within Last 6 Months Current Homicidal Plan: No Describe Current Homicidal Plan: Patient reports having no plan Access to Homicidal Means: Yes Describe Access to Homicidal Means: Patient reports having access to knives Identified Victim: Patient reports thoughts of harms an unknown individual History of harm to others?: No (Patient denies) Assessment of Violence: On admission Violent Behavior Description: Patient reported having no identified plan Does patient have access to weapons?: Yes (Comment) (Patient reported having access to knives) Criminal Charges Pending?: No (Patient denies) Describe Pending Criminal Charges: Patient denies Does patient have a court date: No (Patient denies) Prior Inpatient Therapy: Prior Inpatient Therapy: Yes Prior Therapy Dates: 2018, 2017 Prior Therapy Facilty/Provider(s): [facility] Reason for Treatment: SI, SA issues Prior Outpatient Therapy: Prior Outpatient Therapy: No Prior Therapy Dates: NA Prior Therapy Facilty/Provider(s): NA Reason for Treatment: NA Does patient have an ACCT team?: No Does patient have Intensive In-House Services?  : No Does patient have [facility] services? : No Does patient have P4CC services?: No  Past Medical History:  Past Medical History:  Diagnosis Date  . Alcoholic hepatitis   . Depression   . ETOH abuse   . Hypertension   . Seizures ([place])     Past Surgical History:  Procedure Laterality Date  . HERNIA REPAIR    . LEG SURGERY     Family History:  Family History  Problem Relation Age of Onset  . Diabetes Mother   . Alcoholism Mother   . Emphysema Father   . Lung cancer Father   . Alcoholism Father    Family Psychiatric  History:   Social History:  History  Alcohol Use  . Yes    Comment: 5th liquor and one Forty daily       History  Drug Use  . Types: Cocaine    Social History   Social History  . Marital status: Divorced    Spouse name: N/A  . Number of children: N/A  . Years of education: N/A   Social History Main Topics  . Smoking status: Current Every Day Smoker    Packs/day: 2.00    Types: Cigarettes  . Smokeless tobacco: Never Used  . Alcohol use Yes     Comment: 5th liquor and one Forty daily    . Drug use: Yes    Types: Cocaine  . Sexual activity: Not Currently   Other Topics Concern  . None   Social History Narrative  . None   Additional Social History:    Allergies:   Allergies  Allergen  Reactions  . Aspirin Nausea And Vomiting    Labs:  Results for orders placed or performed during the hospital encounter of 10/14/16 (from the past 48 hour(s))  POC occult blood, ED Provider will collect     Status: None   Collection Time: 10/14/16 10:47 PM  Result Value Ref Range   Fecal Occult Bld NEGATIVE NEGATIVE  Comprehensive metabolic panel     Status: Abnormal   Collection  Time: 10/14/16 11:05 PM  Result Value Ref Range   Sodium 139 135 - 145 mmol/L   Potassium 3.5 3.5 - 5.1 mmol/L   Chloride 104 101 - 111 mmol/L   CO2 24 22 - 32 mmol/L   Glucose, Bld 93 65 - 99 mg/dL   BUN <5 (L) 6 - 20 mg/dL   Creatinine, Ser 0.83 0.61 - 1.24 mg/dL   Calcium 8.4 (L) 8.9 - 10.3 mg/dL   Total Protein 7.0 6.5 - 8.1 g/dL   Albumin 4.0 3.5 - 5.0 g/dL   AST 202 (H) 15 - 41 U/L   ALT 128 (H) 17 - 63 U/L   Alkaline Phosphatase 110 38 - 126 U/L   Total Bilirubin 0.6 0.3 - 1.2 mg/dL   GFR calc non Af Amer >60 >60 mL/min   GFR calc Af Amer >60 >60 mL/min    Comment: (NOTE) The eGFR has been calculated using the CKD EPI equation. This calculation has not been validated in all clinical situations. eGFR's persistently <60 mL/min signify possible Chronic Kidney Disease.    Anion gap 11 5 - 15  Ethanol     Status: Abnormal   Collection Time: 10/14/16 11:05 PM  Result Value Ref Range   Alcohol, Ethyl (B) 380 (HH) <5 mg/dL    Comment:        LOWEST DETECTABLE LIMIT FOR SERUM ALCOHOL IS 5 mg/dL FOR MEDICAL PURPOSES ONLY CRITICAL RESULT CALLED TO, READ BACK BY AND VERIFIED WITH: MARSHALL,C RN 10/14/2016 8101 JORDANS   Salicylate level     Status: None   Collection Time: 10/14/16 11:05 PM  Result Value Ref Range   Salicylate Lvl <7.5 2.8 - 30.0 mg/dL  Acetaminophen level     Status: Abnormal   Collection Time: 10/14/16 11:05 PM  Result Value Ref Range   Acetaminophen (Tylenol), Serum <10 (L) 10 - 30 ug/mL    Comment:        THERAPEUTIC CONCENTRATIONS VARY SIGNIFICANTLY. A RANGE OF 10-30 ug/mL MAY BE AN EFFECTIVE CONCENTRATION FOR MANY PATIENTS. HOWEVER, SOME ARE BEST TREATED AT CONCENTRATIONS OUTSIDE THIS RANGE. ACETAMINOPHEN CONCENTRATIONS >150 ug/mL AT 4 HOURS AFTER INGESTION AND >50 ug/mL AT 12 HOURS AFTER INGESTION ARE OFTEN ASSOCIATED WITH TOXIC REACTIONS.   Lipase, blood     Status: Abnormal   Collection Time: 10/14/16 11:05 PM  Result Value Ref Range    Lipase 58 (H) 11 - 51 U/L  CBC with Differential     Status: Abnormal   Collection Time: 10/14/16 11:05 PM  Result Value Ref Range   WBC 4.7 4.0 - 10.5 K/uL   RBC 4.10 (L) 4.22 - 5.81 MIL/uL   Hemoglobin 12.1 (L) 13.0 - 17.0 g/dL   HCT 36.4 (L) 39.0 - 52.0 %   MCV 88.8 78.0 - 100.0 fL   MCH 29.5 26.0 - 34.0 pg   MCHC 33.2 30.0 - 36.0 g/dL   RDW 15.0 11.5 - 15.5 %   Platelets 213 150 - 400 K/uL   Neutrophils Relative % 39 %   Neutro Abs  1.9 1.7 - 7.7 K/uL   Lymphocytes Relative 34 %   Lymphs Abs 1.6 0.7 - 4.0 K/uL   Monocytes Relative 20 %   Monocytes Absolute 0.9 0.1 - 1.0 K/uL   Eosinophils Relative 5 %   Eosinophils Absolute 0.2 0.0 - 0.7 K/uL   Basophils Relative 2 %   Basophils Absolute 0.1 0.0 - 0.1 K/uL  Rapid urine drug screen (hospital performed)     Status: Abnormal   Collection Time: 10/15/16  4:25 AM  Result Value Ref Range   Opiates NONE DETECTED NONE DETECTED   Cocaine POSITIVE (A) NONE DETECTED   Benzodiazepines NONE DETECTED NONE DETECTED   Amphetamines NONE DETECTED NONE DETECTED   Tetrahydrocannabinol NONE DETECTED NONE DETECTED   Barbiturates NONE DETECTED NONE DETECTED    Comment:        DRUG SCREEN FOR MEDICAL PURPOSES ONLY.  IF CONFIRMATION IS NEEDED FOR ANY PURPOSE, NOTIFY LAB WITHIN 5 DAYS.        LOWEST DETECTABLE LIMITS FOR URINE DRUG SCREEN Drug Class       Cutoff (ng/mL) Amphetamine      1000 Barbiturate      200 Benzodiazepine   638 Tricyclics       756 Opiates          300 Cocaine          300 THC              50   Urinalysis, Routine w reflex microscopic     Status: None   Collection Time: 10/15/16  4:25 AM  Result Value Ref Range   Color, Urine YELLOW YELLOW   APPearance CLEAR CLEAR   Specific Gravity, Urine 1.006 1.005 - 1.030   pH 5.0 5.0 - 8.0   Glucose, UA NEGATIVE NEGATIVE mg/dL   Hgb urine dipstick NEGATIVE NEGATIVE   Bilirubin Urine NEGATIVE NEGATIVE   Ketones, ur NEGATIVE NEGATIVE mg/dL   Protein, ur NEGATIVE NEGATIVE  mg/dL   Nitrite NEGATIVE NEGATIVE   Leukocytes, UA NEGATIVE NEGATIVE    Current Facility-Administered Medications  Medication Dose Route Frequency Provider Last Rate Last Dose  . alum & mag hydroxide-simeth (MAALOX/MYLANTA) 200-200-20 MG/5ML suspension 30 mL  30 mL Oral Q6H PRN Street, [place], [place]      . citalopram (CELEXA) tablet 10 mg  10 mg Oral Daily [address], [place], [place]   10 mg at 10/15/16 1313  . gabapentin (NEURONTIN) capsule 300 mg  300 mg Oral TID [address], [place], [place]   300 mg at 10/15/16 1313  . LORazepam (ATIVAN) injection 0-4 mg  0-4 mg Intravenous [address], [place], [place]   Stopped at 10/15/16 [phone]   Or  . LORazepam (ATIVAN) tablet 0-4 mg  0-4 mg Oral [address] [place], [place]   1 mg at 10/15/16 1312  . [START ON 10/17/2016] LORazepam (ATIVAN) injection 0-4 mg  0-4 mg Intravenous [address] [place], [place]       Or  . Derrill Memo ON 10/17/2016] LORazepam (ATIVAN) tablet 0-4 mg  0-4 mg Oral [address], [place], [place]   4 mg at 10/15/16 0749  . nicotine (NICODERM CQ - dosed in mg/24 hours) patch 21 mg  21 mg Transdermal Daily [address], [place], [place]   21 mg at 10/15/16 1313  . ondansetron (ZOFRAN) tablet 4 mg  4 mg Oral Q8H PRN [address], [place], [place]   4 mg at 10/15/16 0748  . thiamine (VITAMIN B-1) tablet 100 mg  100 mg Oral Daily [address] [place], [place]   100  mg at 10/15/16 0748   Or  . thiamine (B-1) injection 100 mg  100 mg Intravenous Daily [address], [place], [place]   100 mg at 10/14/16 2336  . zolpidem (AMBIEN) tablet 5 mg  5 mg Oral QHS PRN [address], [place], [place]       Current Outpatient Prescriptions  Medication Sig Dispense Refill  . citalopram (CELEXA) 20 MG tablet Take 0.5 tablets (10 mg total) by mouth daily. 30 tablet 0  . gabapentin (NEURONTIN) 300 MG capsule Take 1 capsule (300 mg total) by mouth 3 (three) times daily. 90 capsule 0  . ondansetron (ZOFRAN ODT) 4 MG disintegrating tablet Take 1 tablet (4 mg total) by mouth every 8 (eight) hours as needed for nausea  or vomiting. 10 tablet 0  . chlordiazePOXIDE (LIBRIUM) 25 MG capsule 41m PO TID x 1D, then 25-533mPO BID X 1D, then 25-5025mO QD X 1D (Patient not taking: Reported on 10/04/2016) 10 capsule 0    Musculoskeletal: UTA via camera  Psychiatric Specialty Exam: Physical Exam  Nursing note and vitals reviewed.   Review of Systems  Psychiatric/Behavioral: Positive for depression, substance abuse and suicidal ideas. Negative for hallucinations and memory loss. The patient is not nervous/anxious and does not have insomnia.   All other systems reviewed and are negative.   Blood pressure (!) 149/91, pulse 82, temperature 98.2 F (36.8 C), temperature source Oral, resp. rate 18, height _0  (1.702 m), weight 81.6 kg (180 lb), SpO2 99 %.Body mass index is 28.19 kg/m.  General Appearance: Disheveled  Eye Contact:  Fair  Speech:  Clear and Coherent and Normal Rate  Volume:  Normal  Mood:  Depressed and Hopeless  Affect:  Constricted  Thought Process:  Coherent and Goal Directed  Orientation:  Full (Time, Place, and Person)  Thought Content:  WDL and Logical  Suicidal Thoughts:  Yes.  without intent/plan  Homicidal Thoughts:  Yes.  without intent/plan  Memory:  Immediate;   Good Recent;   Good Remote;   Fair  Judgement:  Intact  Insight:  Present  Psychomotor Activity:  Normal  Concentration:  Concentration: Good and Attention Span: Good  Recall:  Good  Fund of Knowledge:  Good  Language:  Good  Akathisia:  Negative  Handed:  Right  AIMS (if indicated):     Assets:  Communication Skills Desire for Improvement Physical Health  ADL's:  Intact  Cognition:  WNL  Sleep:        Treatment Plan Summary: Plan to discharge patient when medically cleared.  Patient able to contract for safety and agrees to follow up with OP treatment places this time.  Disposition: No evidence of imminent risk to self or others at present.   Patient does not meet criteria for psychiatric inpatient  admission. Supportive therapy provided about ongoing stressors. Refer to IOP. Discussed crisis plan, support from social network, calling 911, coming to the Emergency Department, and calling Suicide Hotline.    JusVicenta AlyP 10/15/2016 2:11 PM

## 2016-10-15 NOTE — ED Notes (Signed)
Pt calm and cooperative yet startles easily. Pt states he feels very nauseated. New CIWA finished and pt medicated. Very appreciative. States "i couldn't hurt myself right now if I wanted to". Sitter at bedside.

## 2016-10-15 NOTE — ED Notes (Signed)
Patient attempting to give a urine sample now

## 2016-10-22 ENCOUNTER — Emergency Department (HOSPITAL_COMMUNITY): Payer: Self-pay

## 2016-10-22 ENCOUNTER — Encounter (HOSPITAL_COMMUNITY): Payer: Self-pay | Admitting: Emergency Medicine

## 2016-10-22 ENCOUNTER — Emergency Department (HOSPITAL_COMMUNITY)
Admission: EM | Admit: 2016-10-22 | Discharge: 2016-10-23 | Disposition: A | Discharge status: Home / Self Care | Payer: Self-pay | Attending: Emergency Medicine | Admitting: Emergency Medicine

## 2016-10-22 ENCOUNTER — Emergency Department (HOSPITAL_COMMUNITY)
Admission: EM | Admit: 2016-10-22 | Discharge: 2016-10-22 | Disposition: A | Discharge status: Home / Self Care | Payer: Self-pay | Attending: Emergency Medicine | Admitting: Emergency Medicine

## 2016-10-22 DIAGNOSIS — I10 Essential (primary) hypertension: Secondary | ICD-10-CM | POA: Insufficient documentation

## 2016-10-22 DIAGNOSIS — F1010 Alcohol abuse, uncomplicated: Secondary | ICD-10-CM | POA: Insufficient documentation

## 2016-10-22 DIAGNOSIS — Z79899 Other long term (current) drug therapy: Secondary | ICD-10-CM | POA: Insufficient documentation

## 2016-10-22 DIAGNOSIS — F10920 Alcohol use, unspecified with intoxication, uncomplicated: Secondary | ICD-10-CM

## 2016-10-22 DIAGNOSIS — F17210 Nicotine dependence, cigarettes, uncomplicated: Secondary | ICD-10-CM | POA: Insufficient documentation

## 2016-10-22 DIAGNOSIS — G8929 Other chronic pain: Secondary | ICD-10-CM | POA: Insufficient documentation

## 2016-10-22 DIAGNOSIS — R112 Nausea with vomiting, unspecified: Secondary | ICD-10-CM | POA: Insufficient documentation

## 2016-10-22 DIAGNOSIS — F329 Major depressive disorder, single episode, unspecified: Secondary | ICD-10-CM | POA: Insufficient documentation

## 2016-10-22 DIAGNOSIS — R0789 Other chest pain: Secondary | ICD-10-CM | POA: Insufficient documentation

## 2016-10-22 DIAGNOSIS — R109 Unspecified abdominal pain: Secondary | ICD-10-CM | POA: Insufficient documentation

## 2016-10-22 DIAGNOSIS — R197 Diarrhea, unspecified: Secondary | ICD-10-CM | POA: Insufficient documentation

## 2016-10-22 LAB — BASIC METABOLIC PANEL
Anion gap: 10 (ref 5–15)
BUN: <5 mg/dL — ABNORMAL LOW (ref 6–20)
CALCIUM: 8.6 mg/dL — AB (ref 8.9–10.3)
CO2: 27 mmol/L (ref 22–32)
CREATININE: 0.79 mg/dL (ref 0.61–1.24)
Chloride: 105 mmol/L (ref 101–111)
GFR calc non Af Amer: >60 mL/min (ref >60)
Glucose, Bld: 100 mg/dL — ABNORMAL HIGH (ref 65–99)
Potassium: 3.4 mmol/L — ABNORMAL LOW (ref 3.5–5.1)
SODIUM: 142 mmol/L (ref 135–145)

## 2016-10-22 LAB — URINALYSIS, ROUTINE W REFLEX MICROSCOPIC
BILIRUBIN URINE: NEGATIVE
GLUCOSE, UA: NEGATIVE mg/dL
HGB URINE DIPSTICK: NEGATIVE
KETONES UR: NEGATIVE mg/dL
Leukocytes, UA: NEGATIVE
Nitrite: NEGATIVE
PROTEIN: NEGATIVE mg/dL
Specific Gravity, Urine: 1.005 (ref 1.005–1.030)
pH: 5 (ref 5.0–8.0)

## 2016-10-22 LAB — HEPATIC FUNCTION PANEL
ALBUMIN: 4.1 g/dL (ref 3.5–5.0)
ALT: 82 U/L — ABNORMAL HIGH (ref 17–63)
AST: 148 U/L — ABNORMAL HIGH (ref 15–41)
Alkaline Phosphatase: 137 U/L — ABNORMAL HIGH (ref 38–126)
BILIRUBIN TOTAL: 0.5 mg/dL (ref 0.3–1.2)
Total Protein: 7.4 g/dL (ref 6.5–8.1)

## 2016-10-22 LAB — RAPID URINE DRUG SCREEN, HOSP PERFORMED
Amphetamines: NOT DETECTED
Barbiturates: NOT DETECTED
Benzodiazepines: NOT DETECTED
Cocaine: NOT DETECTED
Opiates: NOT DETECTED
Tetrahydrocannabinol: NOT DETECTED

## 2016-10-22 LAB — CBC
HCT: 38.8 % — ABNORMAL LOW (ref 39.0–52.0)
Hemoglobin: 13 g/dL (ref 13.0–17.0)
MCH: 29.7 pg (ref 26.0–34.0)
MCHC: 33.5 g/dL (ref 30.0–36.0)
MCV: 88.8 fL (ref 78.0–100.0)
PLATELETS: 155 10*3/uL (ref 150–400)
RBC: 4.37 MIL/uL (ref 4.22–5.81)
RDW: 14.5 % (ref 11.5–15.5)
WBC: 5.8 10*3/uL (ref 4.0–10.5)

## 2016-10-22 LAB — LIPASE, BLOOD: LIPASE: 61 U/L — AB (ref 11–51)

## 2016-10-22 LAB — I-STAT TROPONIN, ED: TROPONIN I, POC: 0 ng/mL (ref 0.00–0.08)

## 2016-10-22 LAB — ETHANOL: ALCOHOL ETHYL (B): 394 mg/dL — AB (ref <5)

## 2016-10-22 LAB — CBG MONITORING, ED: GLUCOSE-CAPILLARY: 94 mg/dL (ref 65–99)

## 2016-10-22 MED ORDER — ONDANSETRON HCL 4 MG/2ML IJ SOLN
4.0000 mg | Freq: Once | INTRAMUSCULAR | Status: AC
  Administered 2016-10-22: 4 mg via INTRAVENOUS
  Filled 2016-10-22: qty 2

## 2016-10-22 MED ORDER — SODIUM CHLORIDE 0.9 % IV BOLUS (SEPSIS)
1000.0000 mL | Freq: Once | INTRAVENOUS | Status: AC
  Administered 2016-10-22: 1000 mL via INTRAVENOUS

## 2016-10-22 MED ORDER — KETOROLAC TROMETHAMINE 30 MG/ML IJ SOLN
30.0000 mg | Freq: Once | INTRAMUSCULAR | Status: AC
  Administered 2016-10-22: 30 mg via INTRAVENOUS
  Filled 2016-10-22: qty 1

## 2016-10-22 MED ORDER — IOPAMIDOL (ISOVUE-300) INJECTION 61%
INTRAVENOUS | Status: AC
  Administered 2016-10-22: 100 mL
  Filled 2016-10-22: qty 100

## 2016-10-22 NOTE — ED Provider Notes (Addendum)
By signing my name below, I, Theresia Bough, attest that this documentation has been prepared under the direction and in the presence of Emunah Texidor, Delice Bison, DO. Electronically Signed: Theresia Bough, ED Scribe. 10/22/16. 1:55 AM.  TIME SEEN: 1:48 AM  CHIEF COMPLAINT: CP, chronic cough, nausea, vomiting, diarrhea, abdominal pain  HPI: HPI Comments: Barry Moore is a 51 y.o. male with a PMHx of HTN, alcohol abuse and seizures, who presents to the Emergency Department via EMS with multiple complaints. First patient is complaining of left-sided, constant CP onset 2 days ago. Pt states pain is exacerbated by coughing. Describes it as a sharp pain. Has associated shortness of breath. Pt reports associated a productive cough (2-3 months) with white sputum.  He also reports he has had nausea, vomiting (last time was just PTA), diarrhea (2-3 days), and RLQ and LLQ abdominal pain (2 weeks). Pt endorses tobacco use and alcohol consumption of 2-3 40 oz a day. Pt states he does not take seizure medication even though he is supposed to. Can't recall name of medicine.  Describes these as alcohol withdrawal seizures.  No hx of abdominal surgery. No dysuria, hematuria other complaints at this time.   ROS: See HPI Constitutional: no fever  Eyes: no drainage  ENT: no runny nose   Cardiovascular:  +chest pain  Resp: no SOB, +cough GI: +vomiting, +nausea, +diarrhea, +abdominal pain GU: no dysuria, no hematuria Integumentary: no rash  Allergy: no hives  Musculoskeletal: no leg swelling  Neurological: no slurred speech ROS otherwise negative  PAST MEDICAL HISTORY/PAST SURGICAL HISTORY:  Past Medical History:  Diagnosis Date  . Alcoholic hepatitis   . Depression   . ETOH abuse   . Hypertension   . Seizures ([place])     MEDICATIONS:  Prior to Admission medications   Medication Sig Start Date End Date Taking? Authorizing Provider  chlordiazePOXIDE (LIBRIUM) 25 MG capsule 50mg  PO TID x 1D, then 25-50mg  PO  BID X 1D, then 25-50mg  PO QD X 1D Patient not taking: Reported on 10/04/2016 09/27/16   Isla Pence, MD  citalopram (CELEXA) 20 MG tablet Take 0.5 tablets (10 mg total) by mouth daily. 08/20/16   Patrecia Pour, NP  gabapentin (NEURONTIN) 300 MG capsule Take 1 capsule (300 mg total) by mouth 3 (three) times daily. 08/20/16   Patrecia Pour, NP  ondansetron (ZOFRAN ODT) 4 MG disintegrating tablet Take 1 tablet (4 mg total) by mouth every 8 (eight) hours as needed for nausea or vomiting. 10/05/16   Nona Dell, PA-C    ALLERGIES:  Allergies  Allergen Reactions  . Aspirin Nausea And Vomiting    SOCIAL HISTORY:  Social History  Substance Use Topics  . Smoking status: Current Every Day Smoker    Packs/day: 1.00    Types: Cigarettes  . Smokeless tobacco: Never Used  . Alcohol use Yes     Comment: 5th liquor and one Forty daily      FAMILY HISTORY: Family History  Problem Relation Age of Onset  . Diabetes Mother   . Alcoholism Mother   . Emphysema Father   . Lung cancer Father   . Alcoholism Father     EXAM: BP 123/87   Pulse 72   Resp 19   Ht 5\' 7"  (1.702 m)   Wt 180 lb (81.6 kg)   SpO2 97%   BMI 28.19 kg/m  CONSTITUTIONAL: Alert and oriented and responds appropriately to questions. Appears intoxicated, chronically ill-appearing, afebrile, no distress, pleasant HEAD: Normocephalic, atraumatic EYES:  Conjunctivae clear, pupils appear equal, EOMI ENT: normal nose; moist mucous membranes NECK: Supple, no meningismus, no nuchal rigidity, no LAD  CARD: RRR; S1 and S2 appreciated; no murmurs, no clicks, no rubs, no gallops RESP: Normal chest excursion without splinting or tachypnea; breath sounds clear and equal bilaterally; no wheezes, no rhonchi, no rales, no hypoxia or respiratory distress, speaking full sentences ABD/GI: Normal bowel sounds; non-distended; soft, tender throughout the abdomen with intermittent guarding in the right and left lower quadrants, no  rebound, no peritoneal signs, no hepatosplenomegaly BACK:  The back appears normal and is non-tender to palpation, there is no CVA tenderness EXT: Normal ROM in all joints; non-tender to palpation; no edema; normal capillary refill; no cyanosis, no calf tenderness or swelling    SKIN: Normal color for age and race; warm; no rash NEURO: Moves all extremities equally, cranial nerves II through contact, normal speech, sensation to light touch intact diffusely PSYCH: The patient's mood and manner are appropriate. Grooming and personal hygiene are appropriate.  MEDICAL DECISION MAKING: Patient here with multiple complaints. His chest pain seems atypical and is worse with coughing. Has been present for over 24 hours and is constant. Doubt ACS or PE. Doubt dissection. EKG shows no ischemic changes. We'll obtain cardiac labs, chest x-ray.  Also linear nausea, vomiting and diarrhea with abdominal pain. He is tender throughout the abdomen with guarding in the right and left lower quadrants. Differential diagnosis includes appendicitis, colitis, diverticulitis, bowel obstruction, UTI. Will obtain abdominal labs, CT of his abdomen and pelvis, urinalysis.  Will treat symptoms with IV fluids, Toradol, Zofran and reassess.  ED PROGRESS: Patient's workup has been unremarkable other than mild elevation of his liver function tests which is consistent with alcohol abuse. His alcohol level is greater than 300. Troponin negative. Chest x-ray clear. CT of the abdomen and pelvis shows no acute abnormalities. He is resting comfortably. When he is aroused he is requesting something to eat or drink. We'll monitor until clinically sober.   6:30 AM  Patient has been able to eat and drink without any difficulty. He has been able to ambulate without assistance. His speech is clear. He has no current complaints. No vomiting or diarrhea in the emergency department. I feel he is safe to be discharged. I have provided him with  outpatient follow-up information.  At this time, I do not feel there is any life-threatening condition present. I have reviewed and discussed all results (EKG, imaging, lab, urine as appropriate) and exam findings with patient/family. I have reviewed nursing notes and appropriate previous records.  I feel the patient is safe to be discharged home without further emergent workup and can continue workup as an outpatient as needed. Discussed usual and customary return precautions. Patient/family verbalize understanding and are comfortable with this plan.  Outpatient follow-up has been provided if needed. All questions have been answered.      EKG Interpretation  Date/Time:  Friday October 22 2016 01:25:43 EDT Ventricular Rate:  75 PR Interval:    QRS Duration: 97 QT Interval:  380 QTC Calculation: 425 R Axis:   77 Text Interpretation:  Sinus rhythm ST elev, probable normal early repol pattern Baseline wander in lead(s) I No significant change since last tracing Confirmed by Pryor Curia [phone]) on 10/22/2016 1:34:52 AM        I personally performed the services described in this documentation, which was scribed in my presence. The recorded information has been reviewed and is accurate.  Lupe Handley, Delice Bison, DO 10/22/16 [place], Delice Bison, DO 10/22/16 2352

## 2016-10-22 NOTE — Discharge Instructions (Addendum)
To find a primary care or specialty doctor please call [phone] or [phone] to access "[facility] Find a Doctor Service." ° °You may also go on the [facility] website at [email or web address] ° °There are also multiple [facility], [facility] and [facility] practices throughout the [place] that are frequently accepting new patients. You may find a clinic that is close to your home and contact them. ° °[facility] -  °[address] °[place] [address] [phone] ° ° °[facility] -  °[address] °[place] [address] [phone] ° ° °[facility] - °[address]  °[place] [address] °[phone] ° ° °

## 2016-10-22 NOTE — ED Notes (Signed)
BIB EMS, pt is homeless. Reports CP X several days, cough X several years, N/V/D X 2 weeks. ETOH on board.

## 2016-10-22 NOTE — ED Notes (Signed)
Bed: KC00 Expected date:  Expected time:  Means of arrival:  Comments: EMS 51 yo male seizure/alcohol withdrawal

## 2016-10-22 NOTE — ED Provider Notes (Signed)
[facility] DEPT Provider Note   CSN: [id number] Arrival date & time: 10/22/16  2141     History   Chief Complaint Chief Complaint  Patient presents with  . Seizures    HPI Barry Moore is a 51 y.o. male.  HPI   51 year old male with a history of hypertension, alcohol abuse, seizures presents to the emergency department with alcohol intoxication. Patient was picked up via EMS and brought here for evaluation patient reports drinking several beers today, unable to quantify how many he is had. Patient also reports smoking crack cocaine over the last several days, noting he has not slept in 2 days. Patient reports generalized body aches. Patient was seen this morning for similar complaints. Patient had workup here in the ED including laboratory analysis that was significant for elevated LFTs likely secondary to chronic alcohol abuse. Patient has been seen numerous times for similar presentation. Patient denies any focal pain, trauma, or any other significant complaints.   Past Medical History:  Diagnosis Date  . Alcoholic hepatitis   . Depression   . ETOH abuse   . Hypertension   . Seizures [facility])     Patient Active Problem List   Diagnosis Date Noted  . Alcohol abuse   . Major depressive disorder, recurrent severe without psychotic features ([place]) 07/06/2016  . Alcohol-induced mood disorder ([place]) 06/15/2016  . Seizure ([place]) 05/26/2016  . Tobacco abuse 05/26/2016  . Homeless 05/26/2016  . Essential hypertension 05/26/2016  . Alcohol dependence with alcohol-induced mood disorder ([place]) 02/14/2016  . Severe alcohol withdrawal without perceptual disturbances without complication ([place]) 78/29/5621  . Alcoholic hepatitis without ascites 02/14/2016  . Thrombocytopenia ([place]) 02/14/2016    Past Surgical History:  Procedure Laterality Date  . HERNIA REPAIR    . LEG SURGERY         Home Medications    Prior to Admission medications   Medication Sig Start Date End Date  Taking? Authorizing Provider  citalopram (CELEXA) 20 MG tablet Take 0.5 tablets (10 mg total) by mouth daily. 08/20/16  Yes Patrecia Pour, NP  gabapentin (NEURONTIN) 300 MG capsule Take 1 capsule (300 mg total) by mouth 3 (three) times daily. 08/20/16  Yes Patrecia Pour, NP  ondansetron (ZOFRAN ODT) 4 MG disintegrating tablet Take 1 tablet (4 mg total) by mouth every 8 (eight) hours as needed for nausea or vomiting. 10/05/16  Yes Nona Dell, PA-C    Family History Family History  Problem Relation Age of Onset  . Diabetes Mother   . Alcoholism Mother   . Emphysema Father   . Lung cancer Father   . Alcoholism Father     Social History Social History  Substance Use Topics  . Smoking status: Current Every Day Smoker    Packs/day: 1.00    Types: Cigarettes  . Smokeless tobacco: Never Used  . Alcohol use Yes     Comment: 5th liquor and one Forty daily       Allergies   Aspirin   Review of Systems Review of Systems  Unable to perform ROS: Other     Physical Exam Updated Vital Signs BP (!) 136/94 (BP Location: Right Arm)   Pulse 91   Temp 98.8 F (37.1 C) (Oral)   Resp 16   SpO2 97%   Physical Exam  Constitutional: He is oriented to person, place, and time. He appears well-developed and well-nourished.  HENT:  Head: Normocephalic and atraumatic.  Eyes: Pupils are equal, round, and reactive to light.  Conjunctivae are normal. Right eye exhibits no discharge. Left eye exhibits no discharge. No scleral icterus.  Neck: Normal range of motion. No JVD present. No tracheal deviation present.  Cardiovascular: Normal rate, regular rhythm and normal heart sounds.   Pulmonary/Chest: Effort normal and breath sounds normal. No stridor. No respiratory distress. He has no wheezes. He has no rales. He exhibits no tenderness.  Abdominal: Soft. He exhibits no distension. There is no tenderness.  Neurological: He is alert and oriented to person, place, and time. Coordination  normal.  No tremors noted  Psychiatric: He has a normal mood and affect. His behavior is normal. Judgment and thought content normal.  Nursing note and vitals reviewed.    ED Treatments / Results  Labs (all labs ordered are listed, but only abnormal results are displayed) Labs Reviewed  CBG MONITORING, ED    EKG  EKG Interpretation None       Radiology Dg Chest 2 View  Result Date: 10/22/2016 CLINICAL DATA:  51 year old male with chest pain and cough. EXAM: CHEST  2 VIEW COMPARISON:  Chest radiograph dated 04/29/2016 FINDINGS: The heart size and mediastinal contours are within normal limits. Both lungs are clear. The visualized skeletal structures are unremarkable. IMPRESSION: No active cardiopulmonary disease. Electronically Signed   By: Anner Crete M.D.   On: 10/22/2016 01:51   Ct Abdomen Pelvis W Contrast  Result Date: 10/22/2016 CLINICAL DATA:  Chest pain for several days. Cough for several years. Nausea, vomiting, and diarrhea for 2 weeks. EXAM: CT ABDOMEN AND PELVIS WITH CONTRAST TECHNIQUE: Multidetector CT imaging of the abdomen and pelvis was performed using the standard protocol following bolus administration of intravenous contrast. CONTRAST:  18mL ISOVUE-300 IOPAMIDOL (ISOVUE-300) INJECTION 61% COMPARISON:  None. FINDINGS: Lower chest: Mild dependent atelectasis in the lung bases. Hepatobiliary: Diffuse fatty infiltration of the liver. No focal liver abnormality is seen. No gallstones, gallbladder wall thickening, or biliary dilatation. Pancreas: Unremarkable. No pancreatic ductal dilatation or surrounding inflammatory changes. Spleen: Normal in size without focal abnormality. Adrenals/Urinary Tract: Adrenal glands are unremarkable. Kidneys are normal, without renal calculi, focal lesion, or hydronephrosis. Bladder is unremarkable. Stomach/Bowel: Stomach, small bowel, and colon are mostly decompressed. No inflammatory infiltrative changes. No discrete wall thickening  appreciated. Appendix is normal. Vascular/Lymphatic: Aortic atherosclerosis. No enlarged abdominal or pelvic lymph nodes. Reproductive: Prostate is unremarkable. Other: No abdominal wall hernia or abnormality. No abdominopelvic ascites. Musculoskeletal: No acute or significant osseous findings. IMPRESSION: No acute process demonstrated in the abdomen or pelvis. No evidence of bowel obstruction or inflammation. Diffuse fatty infiltration of the liver. Aortic atherosclerosis. Electronically Signed   By: Lucienne Capers M.D.   On: 10/22/2016 03:15    Procedures Procedures (including critical care time)  Medications Ordered in ED Medications - No data to display   Initial Impression / Assessment and Plan / ED Course  I have reviewed the triage vital signs and the nursing notes.  Pertinent labs & imaging results that were available during my care of the patient were reviewed by me and considered in my medical decision making (see chart for details).      Final Clinical Impressions(s) / ED Diagnoses   Final diagnoses:  Alcohol abuse  Alcoholic intoxication without complication (HCC)   Labs: CBG 94  Imaging:  Consults:  Therapeutics:  Discharge Meds:   Assessment/Plan: 51 year old male presents today with alcohol intoxication. Patient has a history of the same, patient was seen this morning with similar presentation. He had workup that was reassuring. Patient  was discharged home. He notes drinking alcohol and smoking crack cocaine today. Upon arrival he shows no signs of acute distress, he is clearly intoxicated. No signs of trauma. CBG performed here, no further laboratory analysis necessary as patient is here with similar presentation to this morning. Patient will be monitored here in the ED until clinically sober, reassessed with likely discharge.  Patient reassessed clinically sober and discharged at this time with outpatient follow-up.      New Prescriptions Discharge  Medication List as of 10/23/2016  5:55 AM       Okey Regal, PA-C 10/23/16 4854    Davonna Belling, MD 11/01/16 [phone]

## 2016-10-22 NOTE — ED Triage Notes (Addendum)
Per EMS pt states he  Normally has 9 to 10 40 oz beers a day and today he only had 3 or 4. Tremors noted by EMS on arrival no postical symptoms noted. Hx seizure pt wants to decrease drinking ETOH and is trying to decrease

## 2016-10-22 NOTE — ED Notes (Signed)
Patient ambulated down the hall and back to room with slow, even, steady gait after having mild difficulty standing.  Pt then ambulated to BR.

## 2016-10-23 NOTE — ED Notes (Signed)
Patient given sandwich and sprite 

## 2016-10-23 NOTE — ED Notes (Signed)
Bed: WA23 Expected date:  Expected time:  Means of arrival:  Comments: Room 4 

## 2016-10-23 NOTE — Discharge Instructions (Signed)
,  Please read attached information. If you experience any new or worsening signs or symptoms please return to the emergency room for evaluation. Please follow-up with your primary care provider or specialist as discussed.

## 2016-10-25 ENCOUNTER — Encounter (HOSPITAL_COMMUNITY): Payer: Self-pay | Admitting: Emergency Medicine

## 2016-10-25 DIAGNOSIS — Z5321 Procedure and treatment not carried out due to patient leaving prior to being seen by health care provider: Secondary | ICD-10-CM | POA: Insufficient documentation

## 2016-10-25 LAB — CBC
HCT: 36.3 % — ABNORMAL LOW (ref 39.0–52.0)
Hemoglobin: 12.5 g/dL — ABNORMAL LOW (ref 13.0–17.0)
MCH: 30.2 pg (ref 26.0–34.0)
MCHC: 34.4 g/dL (ref 30.0–36.0)
MCV: 87.7 fL (ref 78.0–100.0)
PLATELETS: 110 10*3/uL — AB (ref 150–400)
RBC: 4.14 MIL/uL — AB (ref 4.22–5.81)
RDW: 14.5 % (ref 11.5–15.5)
WBC: 5.1 10*3/uL (ref 4.0–10.5)

## 2016-10-25 LAB — COMPREHENSIVE METABOLIC PANEL
ALK PHOS: 129 U/L — AB (ref 38–126)
ALT: 67 U/L — AB (ref 17–63)
AST: 136 U/L — ABNORMAL HIGH (ref 15–41)
Albumin: 4 g/dL (ref 3.5–5.0)
Anion gap: 11 (ref 5–15)
BUN: 7 mg/dL (ref 6–20)
CALCIUM: 8.8 mg/dL — AB (ref 8.9–10.3)
CHLORIDE: 103 mmol/L (ref 101–111)
CO2: 25 mmol/L (ref 22–32)
CREATININE: 0.69 mg/dL (ref 0.61–1.24)
Glucose, Bld: 115 mg/dL — ABNORMAL HIGH (ref 65–99)
Potassium: 3.3 mmol/L — ABNORMAL LOW (ref 3.5–5.1)
Sodium: 139 mmol/L (ref 135–145)
Total Bilirubin: 0.3 mg/dL (ref 0.3–1.2)
Total Protein: 7.5 g/dL (ref 6.5–8.1)

## 2016-10-25 LAB — TYPE AND SCREEN
ABO/RH(D): O POS
ANTIBODY SCREEN: NEGATIVE

## 2016-10-25 NOTE — ED Triage Notes (Signed)
Pt also reports to drinking beer prior to EMS arrival.

## 2016-10-25 NOTE — ED Triage Notes (Addendum)
Pt reports that he has been having nausea and vomiting today and then had nose bleed. No active bleeding at this time. Pt states last episode of vomiting was at 1600 and was bloody in color that occurred prior to nose bleed.

## 2016-10-26 ENCOUNTER — Emergency Department (HOSPITAL_COMMUNITY)
Admission: EM | Admit: 2016-10-26 | Discharge: 2016-10-26 | Discharge status: Against Medical Advice | Payer: Self-pay | Attending: Emergency Medicine | Admitting: Emergency Medicine

## 2016-10-26 LAB — ABO/RH: ABO/RH(D): O POS

## 2016-10-26 NOTE — ED Notes (Signed)
No answer when called for room 

## 2016-11-01 ENCOUNTER — Encounter (HOSPITAL_COMMUNITY): Payer: Self-pay | Admitting: Emergency Medicine

## 2016-11-01 ENCOUNTER — Inpatient Hospital Stay (HOSPITAL_COMMUNITY)
Admission: EM | Admit: 2016-11-01 | Discharge: 2016-11-03 | DRG: 378 | Disposition: A | Discharge status: Home / Self Care | Payer: Self-pay | Attending: Family Medicine | Admitting: Family Medicine

## 2016-11-01 DIAGNOSIS — K449 Diaphragmatic hernia without obstruction or gangrene: Secondary | ICD-10-CM | POA: Diagnosis present

## 2016-11-01 DIAGNOSIS — I10 Essential (primary) hypertension: Secondary | ICD-10-CM | POA: Diagnosis present

## 2016-11-01 DIAGNOSIS — K922 Gastrointestinal hemorrhage, unspecified: Secondary | ICD-10-CM | POA: Diagnosis present

## 2016-11-01 DIAGNOSIS — Z8711 Personal history of peptic ulcer disease: Secondary | ICD-10-CM

## 2016-11-01 DIAGNOSIS — F332 Major depressive disorder, recurrent severe without psychotic features: Secondary | ICD-10-CM | POA: Diagnosis present

## 2016-11-01 DIAGNOSIS — F10239 Alcohol dependence with withdrawal, unspecified: Secondary | ICD-10-CM | POA: Diagnosis present

## 2016-11-01 DIAGNOSIS — Z5900 Homelessness unspecified: Secondary | ICD-10-CM

## 2016-11-01 DIAGNOSIS — Z79899 Other long term (current) drug therapy: Secondary | ICD-10-CM

## 2016-11-01 DIAGNOSIS — K269 Duodenal ulcer, unspecified as acute or chronic, without hemorrhage or perforation: Secondary | ICD-10-CM | POA: Diagnosis present

## 2016-11-01 DIAGNOSIS — D62 Acute posthemorrhagic anemia: Secondary | ICD-10-CM | POA: Diagnosis present

## 2016-11-01 DIAGNOSIS — K3189 Other diseases of stomach and duodenum: Secondary | ICD-10-CM | POA: Diagnosis present

## 2016-11-01 DIAGNOSIS — K2971 Gastritis, unspecified, with bleeding: Secondary | ICD-10-CM

## 2016-11-01 DIAGNOSIS — F1010 Alcohol abuse, uncomplicated: Secondary | ICD-10-CM | POA: Diagnosis present

## 2016-11-01 DIAGNOSIS — F1410 Cocaine abuse, uncomplicated: Secondary | ICD-10-CM | POA: Diagnosis present

## 2016-11-01 DIAGNOSIS — K219 Gastro-esophageal reflux disease without esophagitis: Secondary | ICD-10-CM | POA: Diagnosis present

## 2016-11-01 DIAGNOSIS — K760 Fatty (change of) liver, not elsewhere classified: Secondary | ICD-10-CM | POA: Diagnosis present

## 2016-11-01 DIAGNOSIS — D649 Anemia, unspecified: Secondary | ICD-10-CM

## 2016-11-01 DIAGNOSIS — F331 Major depressive disorder, recurrent, moderate: Secondary | ICD-10-CM

## 2016-11-01 DIAGNOSIS — K2980 Duodenitis without bleeding: Secondary | ICD-10-CM | POA: Diagnosis present

## 2016-11-01 DIAGNOSIS — F419 Anxiety disorder, unspecified: Secondary | ICD-10-CM | POA: Diagnosis present

## 2016-11-01 DIAGNOSIS — K766 Portal hypertension: Secondary | ICD-10-CM | POA: Diagnosis present

## 2016-11-01 DIAGNOSIS — F1020 Alcohol dependence, uncomplicated: Secondary | ICD-10-CM

## 2016-11-01 DIAGNOSIS — Z9114 Patient's other noncompliance with medication regimen: Secondary | ICD-10-CM

## 2016-11-01 DIAGNOSIS — Z886 Allergy status to analgesic agent status: Secondary | ICD-10-CM

## 2016-11-01 DIAGNOSIS — K7010 Alcoholic hepatitis without ascites: Secondary | ICD-10-CM | POA: Diagnosis present

## 2016-11-01 DIAGNOSIS — F17210 Nicotine dependence, cigarettes, uncomplicated: Secondary | ICD-10-CM | POA: Diagnosis present

## 2016-11-01 DIAGNOSIS — K921 Melena: Principal | ICD-10-CM | POA: Diagnosis present

## 2016-11-01 DIAGNOSIS — D696 Thrombocytopenia, unspecified: Secondary | ICD-10-CM | POA: Diagnosis present

## 2016-11-01 DIAGNOSIS — Z811 Family history of alcohol abuse and dependence: Secondary | ICD-10-CM

## 2016-11-01 LAB — COMPREHENSIVE METABOLIC PANEL
ALK PHOS: 110 U/L (ref 38–126)
ALT: 46 U/L (ref 17–63)
ANION GAP: 13 (ref 5–15)
AST: 97 U/L — AB (ref 15–41)
Albumin: 3.9 g/dL (ref 3.5–5.0)
BILIRUBIN TOTAL: 0.7 mg/dL (ref 0.3–1.2)
BUN: 13 mg/dL (ref 6–20)
CHLORIDE: 104 mmol/L (ref 101–111)
CO2: 22 mmol/L (ref 22–32)
CREATININE: 0.62 mg/dL (ref 0.61–1.24)
Calcium: 8.3 mg/dL — ABNORMAL LOW (ref 8.9–10.3)
GFR calc Af Amer: >60 mL/min (ref >60)
GLUCOSE: 89 mg/dL (ref 65–99)
POTASSIUM: 3.6 mmol/L (ref 3.5–5.1)
Sodium: 139 mmol/L (ref 135–145)
Total Protein: 7.1 g/dL (ref 6.5–8.1)

## 2016-11-01 LAB — CBC
HEMATOCRIT: 31.6 % — AB (ref 39.0–52.0)
Hemoglobin: 10.9 g/dL — ABNORMAL LOW (ref 13.0–17.0)
MCH: 30.1 pg (ref 26.0–34.0)
MCHC: 34.5 g/dL (ref 30.0–36.0)
MCV: 87.3 fL (ref 78.0–100.0)
PLATELETS: 86 10*3/uL — AB (ref 150–400)
RBC: 3.62 MIL/uL — ABNORMAL LOW (ref 4.22–5.81)
RDW: 14.5 % (ref 11.5–15.5)
WBC: 6.6 10*3/uL (ref 4.0–10.5)

## 2016-11-01 LAB — POC OCCULT BLOOD, ED: Fecal Occult Bld: POSITIVE — AB

## 2016-11-01 LAB — TYPE AND SCREEN
ABO/RH(D): O POS
Antibody Screen: NEGATIVE

## 2016-11-01 LAB — PROTIME-INR
INR: 1.01
PROTHROMBIN TIME: 13.3 s (ref 11.4–15.2)

## 2016-11-01 LAB — LIPASE, BLOOD: Lipase: 37 U/L (ref 11–51)

## 2016-11-01 MED ORDER — LORAZEPAM 1 MG PO TABS
1.0000 mg | ORAL_TABLET | Freq: Four times a day (QID) | ORAL | Status: DC | PRN
  Administered 2016-11-01 – 2016-11-03 (×2): 1 mg via ORAL
  Filled 2016-11-01 (×2): qty 1

## 2016-11-01 MED ORDER — SODIUM CHLORIDE 0.9 % IV BOLUS (SEPSIS)
1000.0000 mL | Freq: Once | INTRAVENOUS | Status: AC
  Administered 2016-11-01: 1000 mL via INTRAVENOUS

## 2016-11-01 MED ORDER — GI COCKTAIL ~~LOC~~
30.0000 mL | Freq: Once | ORAL | Status: AC
  Administered 2016-11-01: 30 mL via ORAL
  Filled 2016-11-01: qty 30

## 2016-11-01 MED ORDER — CITALOPRAM HYDROBROMIDE 20 MG PO TABS
10.0000 mg | ORAL_TABLET | Freq: Every day | ORAL | Status: DC
  Administered 2016-11-01 – 2016-11-03 (×2): 10 mg via ORAL
  Filled 2016-11-01 (×2): qty 1

## 2016-11-01 MED ORDER — LORAZEPAM 2 MG/ML IJ SOLN
1.0000 mg | Freq: Four times a day (QID) | INTRAMUSCULAR | Status: DC | PRN
  Administered 2016-11-01: 1 mg via INTRAVENOUS
  Filled 2016-11-01: qty 1

## 2016-11-01 MED ORDER — ONDANSETRON HCL 4 MG/2ML IJ SOLN
4.0000 mg | Freq: Once | INTRAMUSCULAR | Status: AC
  Administered 2016-11-01: 4 mg via INTRAVENOUS
  Filled 2016-11-01: qty 2

## 2016-11-01 MED ORDER — ONDANSETRON HCL 4 MG PO TABS: 4.0000 mg | ORAL_TABLET | Freq: Four times a day (QID) | ORAL | Status: DC | PRN

## 2016-11-01 MED ORDER — PANTOPRAZOLE SODIUM 40 MG IV SOLR
40.0000 mg | Freq: Two times a day (BID) | INTRAVENOUS | Status: DC
  Administered 2016-11-01 – 2016-11-03 (×5): 40 mg via INTRAVENOUS
  Filled 2016-11-01 (×5): qty 40

## 2016-11-01 MED ORDER — METOCLOPRAMIDE HCL 5 MG/ML IJ SOLN
10.0000 mg | Freq: Once | INTRAMUSCULAR | Status: AC
  Administered 2016-11-01: 10 mg via INTRAVENOUS
  Filled 2016-11-01: qty 2

## 2016-11-01 MED ORDER — ONDANSETRON HCL 4 MG/2ML IJ SOLN: 4.0000 mg | Freq: Once | INTRAMUSCULAR | Status: DC | PRN

## 2016-11-01 MED ORDER — THIAMINE HCL 100 MG/ML IJ SOLN
100.0000 mg | Freq: Every day | INTRAMUSCULAR | Status: DC
  Administered 2016-11-02: 100 mg via INTRAVENOUS
  Filled 2016-11-01 (×3): qty 2

## 2016-11-01 MED ORDER — ZOLPIDEM TARTRATE 5 MG PO TABS: 5.0000 mg | ORAL_TABLET | Freq: Every evening | ORAL | Status: DC | PRN

## 2016-11-01 MED ORDER — SODIUM CHLORIDE 0.9 % IV SOLN
INTRAVENOUS | Status: DC
  Administered 2016-11-01: 13:00:00 via INTRAVENOUS

## 2016-11-01 MED ORDER — ONDANSETRON HCL 4 MG/2ML IJ SOLN: 4.0000 mg | Freq: Four times a day (QID) | INTRAMUSCULAR | Status: DC | PRN

## 2016-11-01 MED ORDER — FOLIC ACID 1 MG PO TABS
1.0000 mg | ORAL_TABLET | Freq: Every day | ORAL | Status: DC
  Administered 2016-11-01 – 2016-11-03 (×2): 1 mg via ORAL
  Filled 2016-11-01 (×2): qty 1

## 2016-11-01 MED ORDER — GABAPENTIN 300 MG PO CAPS
300.0000 mg | ORAL_CAPSULE | Freq: Three times a day (TID) | ORAL | Status: DC
  Administered 2016-11-01 – 2016-11-03 (×5): 300 mg via ORAL
  Filled 2016-11-01 (×5): qty 1

## 2016-11-01 MED ORDER — VITAMIN B-1 100 MG PO TABS
100.0000 mg | ORAL_TABLET | Freq: Every day | ORAL | Status: DC
  Administered 2016-11-01 – 2016-11-03 (×2): 100 mg via ORAL
  Filled 2016-11-01 (×2): qty 1

## 2016-11-01 MED ORDER — ADULT MULTIVITAMIN W/MINERALS CH
1.0000 | ORAL_TABLET | Freq: Every day | ORAL | Status: DC
  Administered 2016-11-01 – 2016-11-03 (×3): 1 via ORAL
  Filled 2016-11-01 (×2): qty 1

## 2016-11-01 NOTE — ED Triage Notes (Signed)
Pt is a 4 (40 oz) a day drinker.  Pt last drank last night.  Reports gen abd pain and dark tarry stools since yesterday.  Nausea and dry heaves but no vomiting.  Given 4 mg zofran en route.

## 2016-11-01 NOTE — ED Provider Notes (Signed)
[facility] DEPT Provider Note   CSN: [id number] Arrival date & time: 11/01/16  0813     History   Chief Complaint Chief Complaint  Patient presents with  . Abdominal Pain  . Nausea    HPI Barry Moore is a 51 y.o. male.  The history is provided by the patient.  Abdominal Pain   This is a recurrent problem. The current episode started yesterday. The problem occurs constantly. The problem has not changed since onset.The pain is associated with alcohol use. The pain is located in the epigastric region and LUQ. The pain is moderate. Associated symptoms include melena and nausea. Pertinent negatives include anorexia, belching, diarrhea, hematochezia, vomiting, dysuria, hematuria and headaches. The symptoms are aggravated by drinking alcohol. Nothing relieves the symptoms. His past medical history is significant for PUD and GERD.    Past Medical History:  Diagnosis Date  . Alcoholic hepatitis   . Depression   . ETOH abuse   . Hypertension   . Seizures [facility])     Patient Active Problem List   Diagnosis Date Noted  . Alcohol abuse   . Major depressive disorder, recurrent severe without psychotic features ([place]) 07/06/2016  . 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 Seizure ([place]) 05/26/2016  . Tobacco abuse 05/26/2016  . Homeless 05/26/2016  . Essential hypertension 05/26/2016  . Alcohol dependence with alcohol-induced mood disorder ([place]) 02/14/2016  . Severe alcohol withdrawal without perceptual disturbances without complication ([place]) 25/63/8937  . Alcoholic hepatitis without ascites 02/14/2016  . Thrombocytopenia ([place]) 02/14/2016    Past Surgical History:  Procedure Laterality Date  . HERNIA REPAIR    . LEG SURGERY         Home Medications    Prior to Admission medications   Medication Sig Start Date End Date Taking? Authorizing Provider  citalopram (CELEXA) 20 MG tablet Take 0.5 tablets (10 mg total) by mouth daily. Patient not taking: Reported on  11/01/2016 08/20/16   Patrecia Pour, NP  gabapentin (NEURONTIN) 300 MG capsule Take 1 capsule (300 mg total) by mouth 3 (three) times daily. Patient not taking: Reported on 11/01/2016 08/20/16   Patrecia Pour, NP  ondansetron (ZOFRAN ODT) 4 MG disintegrating tablet Take 1 tablet (4 mg total) by mouth every 8 (eight) hours as needed for nausea or vomiting. Patient not taking: Reported on 11/01/2016 10/05/16   Nona Dell, PA-C    Family History Family History  Problem Relation Age of Onset  . Diabetes Mother   . Alcoholism Mother   . Emphysema Father   . Lung cancer Father   . Alcoholism Father     Social History Social History  Substance Use Topics  . Smoking status: Current Every Day Smoker    Packs/day: 1.00    Types: Cigarettes  . Smokeless tobacco: Never Used  . Alcohol use Yes     Comment: 5th liquor and one Forty daily       Allergies   Aspirin   Review of Systems Review of Systems  Gastrointestinal: Positive for abdominal pain, melena and nausea. Negative for anorexia, diarrhea, hematochezia and vomiting.  Genitourinary: Negative for dysuria and hematuria.  Neurological: Negative for headaches.  All other systems are reviewed and are negative for acute change except as noted in the HPI    Physical Exam Updated Vital Signs BP (!) 155/88   Pulse 97   Temp 98.1 F (36.7 C) (Oral)   Resp 16   Ht 5\' 8"  (1.727 m)   Wt  77.1 kg (170 lb)   SpO2 100% Comment: Simultaneous filing. User may not have seen previous data.  BMI 25.85 kg/m   Physical Exam  Constitutional: He is oriented to person, place, and time. He appears well-developed and well-nourished. No distress.  HENT:  Head: Normocephalic and atraumatic.  Nose: Nose normal.  Eyes: Pupils are equal, round, and reactive to light. Conjunctivae and EOM are normal. Right eye exhibits no discharge. Left eye exhibits no discharge. No scleral icterus.  Neck: Normal range of motion. Neck supple.    Cardiovascular: Normal rate and regular rhythm.  Exam reveals no gallop and no friction rub.   No murmur heard. Pulmonary/Chest: Effort normal and breath sounds normal. No stridor. No respiratory distress. He has no rales.  Abdominal: Soft. He exhibits no distension. There is tenderness in the epigastric area and left upper quadrant. There is no rigidity, no rebound and no guarding.  Genitourinary: Rectal exam shows guaiac positive stool.  Genitourinary Comments: Dark, pasty stool  Musculoskeletal: He exhibits no edema or tenderness.  Neurological: He is alert and oriented to person, place, and time.  Skin: Skin is warm and dry. No rash noted. He is not diaphoretic. No erythema.  Psychiatric: He has a normal mood and affect.  Vitals reviewed.    ED Treatments / Results  Labs (all labs ordered are listed, but only abnormal results are displayed) Labs Reviewed  COMPREHENSIVE METABOLIC PANEL - Abnormal; Notable for the following:       Result Value   Calcium 8.3 (*)    AST 97 (*)    All other components within normal limits  CBC - Abnormal; Notable for the following:    RBC 3.62 (*)    Hemoglobin 10.9 (*)    HCT 31.6 (*)    Platelets 86 (*)    All other components within normal limits  POC OCCULT BLOOD, ED - Abnormal; Notable for the following:    Fecal Occult Bld POSITIVE (*)    All other components within normal limits  LIPASE, BLOOD  TYPE AND SCREEN    EKG  EKG Interpretation  Date/Time:  Monday November 01 2016 08:47:15 EDT Ventricular Rate:  100 PR Interval:    QRS Duration: 88 QT Interval:  346 QTC Calculation: 447 R Axis:   78 Text Interpretation:  Sinus tachycardia Otherwise no significant change Confirmed by Addison Lank [phone]) on 11/01/2016 10:59:20 AM       Radiology No results found.  Procedures Procedures (including critical care time)  Medications Ordered in ED Medications  ondansetron (ZOFRAN) injection 4 mg (not administered)  sodium chloride  0.9 % bolus 1,000 mL (0 mLs Intravenous Stopped 11/01/16 1054)  ondansetron (ZOFRAN) injection 4 mg (4 mg Intravenous Given 11/01/16 0858)  gi cocktail (Maalox,Lidocaine,Donnatal) (30 mLs Oral Given 11/01/16 0858)  metoCLOPramide (REGLAN) injection 10 mg (10 mg Intravenous Given 11/01/16 1054)  sodium chloride 0.9 % bolus 1,000 mL (1,000 mLs Intravenous New Bag/Given 11/01/16 1056)     Initial Impression / Assessment and Plan / ED Course  I have reviewed the triage vital signs and the nursing notes.  Pertinent labs & imaging results that were available during my care of the patient were reviewed by me and considered in my medical decision making (see chart for details).  Clinical Course as of Nov 01 1137  Mon Nov 01, 2016  2683 Concern for alcoholic gastritis vs PUD vs pancreatitis.  Abd with LUQ and epigastric tenderness. Not suggestive of peritonitis.  EKG nonischemic. Low  suspicion for cardiac etiology.   [PC]  1157 Labs without evidence of pancreatitis or biliary obstruction. Presentation is most consistent with likely alcoholic gastritis versus peptic ulcer disease. Patient is noted to have a 1.5 g Hb drop in 1 week. Will admit for serial hemoglobin checks and likely endoscopy.  [PC]  2620 Case is discussed with Dr. Collene Mares from gastroenterology who will see the patient and perform endoscopy in the morning. Will discuss case with hospitalist for admission  [PC]    Clinical Course User Index [PC] Lysette Lindenbaum, Grayce Sessions, MD      Final Clinical Impressions(s) / ED Diagnoses   Final diagnoses:  Upper GI bleed  Anemia, unspecified type      Leonette Monarch Grayce Sessions, MD 11/01/16 1140

## 2016-11-01 NOTE — H&P (Signed)
[facility]                                                                                    Patient Demographics  Kevon Tench, is a 51 y.o. male  CSN: [id number]  MRN: [id number]  DOB - September 28, 1965  Admit Date - 11/01/2016  Outpatient Primary MD for the patient is Patient, No Pcp Per   With History of -  Past Medical History:  Diagnosis Date  . Alcoholic hepatitis   . Depression   . ETOH abuse   . Hypertension   . Seizures ([place])       Past Surgical History:  Procedure Laterality Date  . HERNIA REPAIR    . LEG SURGERY      in for   Chief Complaint  Patient presents with  . Abdominal Pain  . Nausea     HPI  Cristiano Capri  is a 51 y.o. male, With past medical history significant for alcoholism, peptic ulcer disease, depression ,seizures and alcoholic hepatitis presenting with 2 days history of nausea, melena and abdominal discomfort . In the emergency room his guaiac was positive. Dr. Collene Mares from GI was consulted and will do EGD in a.m. Hemoglobin is 10.9  Review of Systems    In addition to the HPI above, No Fever-chills, No Headache, No changes with Vision or hearing, No problems swallowing food or Liquids, No Chest pain, Cough or Shortness of Breath, No dysuria, No new skin rashes or bruises, No new joints pains-aches,  No new weakness, tingling, numbness in any extremity, No recent weight gain or loss, No polyuria, polydypsia or polyphagia, No significant Mental Stressors.  A full 10 point Review of Systems was done, except as stated above, all other Review of Systems were negative.   Social History Social History  Substance Use Topics  . Smoking status: Current Every Day Smoker    Packs/day: 1.00    Types: Cigarettes  . Smokeless tobacco: Never Used  . Alcohol use Yes     Comment: 5th liquor and one Forty daily       Family History Family History  Problem Relation Age of Onset  . Diabetes Mother   . Alcoholism Mother    . Emphysema Father   . Lung cancer Father   . Alcoholism Father      Prior to Admission medications   Medication Sig Start Date End Date Taking? Authorizing Provider  citalopram (CELEXA) 20 MG tablet Take 0.5 tablets (10 mg total) by mouth daily. Patient not taking: Reported on 11/01/2016 08/20/16   Patrecia Pour, NP  gabapentin (NEURONTIN) 300 MG capsule Take 1 capsule (300 mg total) by mouth 3 (three) times daily. Patient not taking: Reported on 11/01/2016 08/20/16   Patrecia Pour, NP  ondansetron (ZOFRAN ODT) 4 MG disintegrating tablet Take 1 tablet (4 mg total) by mouth every 8 (eight) hours as needed for nausea or vomiting. Patient not taking: Reported on 11/01/2016 10/05/16   Nona Dell, PA-C    Allergies  Allergen Reactions  . Aspirin Nausea And Vomiting    Physical Exam  Vitals  Blood pressure (!) 136/92, pulse 90, temperature 98.4  F (36.9 C), temperature source Oral, resp. rate 12, height 5\' 8"  (1.727 m), weight 77.1 kg (170 lb), SpO2 99 %.   1. General Elderly male, in withdrawal , shaking in bed  2. Flat affect and insight, Not Suicidal or Homicidal, Awake Alert, Oriented X 3.  3. No F.N deficits, grossly, patient moving all extremities   4. Ears and Eyes appear Normal, Conjunctivae clear, PERRLA. Moist Oral Mucosa.  5. Supple Neck, No JVD, No cervical lymphadenopathy appriciated, No Carotid Bruits.  6. Symmetrical Chest wall movement, Good air movement bilaterally, CTAB.  7. RRR, No Gallops, Rubs or Murmurs, No Parasternal Heave.  8. Positive Bowel Sounds, epigastric tenderness noted, No organomegaly appriciated,No rebound -guarding or rigidity.  9.  No Cyanosis, Normal Skin Turgor, No Skin Rash or Bruise.  10. Good muscle tone,  joints appear normal , no effusions, Normal ROM.    Data Review  CBC  Recent Labs Lab 10/25/16 2214 11/01/16 0900  WBC 5.1 6.6  HGB 12.5* 10.9*  HCT 36.3* 31.6*  PLT 110* 86*  MCV 87.7 87.3  MCH 30.2  30.1  MCHC 34.4 34.5  RDW 14.5 14.5   ------------------------------------------------------------------------------------------------------------------  Chemistries   Recent Labs Lab 10/25/16 2214 11/01/16 0900  NA 139 139  K 3.3* 3.6  CL 103 104  CO2 25 22  GLUCOSE 115* 89  BUN 7 13  CREATININE 0.69 0.62  CALCIUM 8.8* 8.3*  AST 136* 97*  ALT 67* 46  ALKPHOS 129* 110  BILITOT 0.3 0.7   ------------------------------------------------------------------------------------------------------------------ estimated creatinine clearance is 105.7 mL/min (by C-G formula based on SCr of 0.62 mg/dL). ------------------------------------------------------------------------------------------------------------------ No results for input(s): TSH, T4TOTAL, T3FREE, THYROIDAB in the last 72 hours.  Invalid input(s): FREET3   Coagulation profile No results for input(s): INR, PROTIME in the last 168 hours. ------------------------------------------------------------------------------------------------------------------- No results for input(s): DDIMER in the last 72 hours. -------------------------------------------------------------------------------------------------------------------  Cardiac Enzymes No results for input(s): CKMB, TROPONINI, MYOGLOBIN in the last 168 hours.  Invalid input(s): CK ------------------------------------------------------------------------------------------------------------------ Invalid input(s): POCBNP   ---------------------------------------------------------------------------------------------------------------  Urinalysis    Component Value Date/Time   COLORURINE STRAW (A) 10/22/2016 0235   APPEARANCEUR CLEAR 10/22/2016 0235   LABSPEC 1.005 10/22/2016 0235   PHURINE 5.0 10/22/2016 0235   GLUCOSEU NEGATIVE 10/22/2016 0235   HGBUR NEGATIVE 10/22/2016 0235   BILIRUBINUR NEGATIVE 10/22/2016 0235   KETONESUR NEGATIVE 10/22/2016 0235   PROTEINUR  NEGATIVE 10/22/2016 0235   NITRITE NEGATIVE 10/22/2016 0235   LEUKOCYTESUR NEGATIVE 10/22/2016 0235    ----------------------------------------------------------------------------------------------------------------  Imaging results:   Dg Chest 2 View  Result Date: 10/22/2016 CLINICAL DATA:  51 year old male with chest pain and cough. EXAM: CHEST  2 VIEW COMPARISON:  Chest radiograph dated 04/29/2016 FINDINGS: The heart size and mediastinal contours are within normal limits. Both lungs are clear. The visualized skeletal structures are unremarkable. IMPRESSION: No active cardiopulmonary disease. Electronically Signed   By: Anner Crete M.D.   On: 10/22/2016 01:51   Ct Abdomen Pelvis W Contrast  Result Date: 10/22/2016 CLINICAL DATA:  Chest pain for several days. Cough for several years. Nausea, vomiting, and diarrhea for 2 weeks. EXAM: CT ABDOMEN AND PELVIS WITH CONTRAST TECHNIQUE: Multidetector CT imaging of the abdomen and pelvis was performed using the standard protocol following bolus administration of intravenous contrast. CONTRAST:  159mL ISOVUE-300 IOPAMIDOL (ISOVUE-300) INJECTION 61% COMPARISON:  None. FINDINGS: Lower chest: Mild dependent atelectasis in the lung bases. Hepatobiliary: Diffuse fatty infiltration of the liver. No focal liver abnormality is seen. No gallstones, gallbladder wall thickening,  or biliary dilatation. Pancreas: Unremarkable. No pancreatic ductal dilatation or surrounding inflammatory changes. Spleen: Normal in size without focal abnormality. Adrenals/Urinary Tract: Adrenal glands are unremarkable. Kidneys are normal, without renal calculi, focal lesion, or hydronephrosis. Bladder is unremarkable. Stomach/Bowel: Stomach, small bowel, and colon are mostly decompressed. No inflammatory infiltrative changes. No discrete wall thickening appreciated. Appendix is normal. Vascular/Lymphatic: Aortic atherosclerosis. No enlarged abdominal or pelvic lymph nodes. Reproductive:  Prostate is unremarkable. Other: No abdominal wall hernia or abnormality. No abdominopelvic ascites. Musculoskeletal: No acute or significant osseous findings. IMPRESSION: No acute process demonstrated in the abdomen or pelvis. No evidence of bowel obstruction or inflammation. Diffuse fatty infiltration of the liver. Aortic atherosclerosis. Electronically Signed   By: Lucienne Capers M.D.   On: 10/22/2016 03:15      Assessment & Plan  1. Melena with anemia 2. Alcoholism with withdrawal, last drink was yesterday 3. Depression 4. Homeless  Plan  Admit to step down IV fluids IV Ativan/monitor for withdrawal Dr. Collene Mares was consulted. Patient for EGD in a.m. if stable. Monitor hemoglobin hematocrit   DVT Prophylaxis SCDs  AM Labs Ordered, also please review Full Orders  .  Code Status full  Disposition Plan: To be determined  Time spent in minutes : 38 minutes  Condition GUARDED   @SIGNATURE @

## 2016-11-01 NOTE — Progress Notes (Signed)
LCSW consulted for homeless resources:    LCSW has reviewed chart.  Patient is well known to [facility] and has been given homeless resources including referrals to [facility], [facility], [place] and [facility] for follow up and care in the last 2-3 months.  Patient continues to consume ETOH and reports abdominal pain.  Patient has chronic alcohol dependence with drinking daily.  Patient has been given information to [facility] and is familiar with program and also can be assisted with Case management for medical follow up and housing concerns with [facility].  Patient not to be admitted from ED.  Information for [facility] has been placed on AVS in which patient can follow up for Day resources. Aware of services at [facility].  No other needs.  Will sign off.  Lane Hacker, MSW Clinical Social Work: [employer] Coverage for :  [phone]

## 2016-11-01 NOTE — Progress Notes (Addendum)
CM noted pt has been to the ED 13 times with 3 admissions in the last 6 months. Spoke with pt who states he is homeless and is familiar with [facility] but has never seen Audrea Muscat.  Encouraged pt to sign up to see her at D/C.  Placed provider information on AVS for time of D/C.  Consulted CSW for homeless resources.  No further CM needs noted at this time.

## 2016-11-01 NOTE — Consult Note (Addendum)
UNASSIGNED PATIENT Reason for Consult: Abdominal pain and black stools. Referring Physician: THP  Barry Moore is an 51 y.o. male.  HPI: 51 year old white male, with multiple [13] ER visits in the last 3 months. Patient gives a history of acute epigastric pain that started yesterday and worsened this morning. He noticed some dark tarry stools along with the abdominal pain. He has a longstanding history of acid reflux but denies having any dysphagia or odynophagia. He denies having frank hematochezia. He has a longstanding history of alcohol and cocaine abuse. He has had some dry heaves but denies having any nausea or vomiting. He has a good appetite and his weight has been stable. He has a previous history of PUD. He denies the use of NSAIDS. His hemoglobin was 12.5 gm/dl on 10/25/2016.  Past Medical History:  Diagnosis Date  . Alcoholic hepatitis   . Depression   . ETOH abuse   . Hypertension   . Seizures ([place])    Past Surgical History:  Procedure Laterality Date  . HERNIA REPAIR    . LEG SURGERY     Family History  Problem Relation Age of Onset  . Diabetes Mother   . Alcoholism Mother   . Emphysema Father   . Lung cancer Father   . Alcoholism Father    Social History:  reports that he has been smoking Cigarettes.  He has been smoking about 1.00 pack per day. He has never used smokeless tobacco. He reports that he drinks alcohol, four 40 ozs of beer per day. He reports that he uses drugs, including cocaine and cannabis.  Allergies:  Allergies  Allergen Reactions  . Aspirin Nausea And Vomiting   Medications: I have reviewed the patient's current medications.  Results for orders placed or performed during the hospital encounter of 11/01/16 (from the past 48 hour(s))  Lipase, blood     Status: None   Collection Time: 11/01/16  9:00 AM  Result Value Ref Range   Lipase 37 11 - 51 U/L  Comprehensive metabolic panel     Status: Abnormal   Collection Time: 11/01/16  9:00 AM   Result Value Ref Range   Sodium 139 135 - 145 mmol/L   Potassium 3.6 3.5 - 5.1 mmol/L   Chloride 104 101 - 111 mmol/L   CO2 22 22 - 32 mmol/L   Glucose, Bld 89 65 - 99 mg/dL   BUN 13 6 - 20 mg/dL   Creatinine, Ser 0.62 0.61 - 1.24 mg/dL   Calcium 8.3 (L) 8.9 - 10.3 mg/dL   Total Protein 7.1 6.5 - 8.1 g/dL   Albumin 3.9 3.5 - 5.0 g/dL   AST 97 (H) 15 - 41 U/L   ALT 46 17 - 63 U/L   Alkaline Phosphatase 110 38 - 126 U/L   Total Bilirubin 0.7 0.3 - 1.2 mg/dL   GFR calc non Af Amer >60 >60 mL/min   GFR calc Af Amer >60 >60 mL/min    Comment: (NOTE) The eGFR has been calculated using the CKD EPI equation. This calculation has not been validated in all clinical situations. eGFR's persistently <60 mL/min signify possible Chronic Kidney Disease.    Anion gap 13 5 - 15  CBC     Status: Abnormal   Collection Time: 11/01/16  9:00 AM  Result Value Ref Range   WBC 6.6 4.0 - 10.5 K/uL   RBC 3.62 (L) 4.22 - 5.81 MIL/uL   Hemoglobin 10.9 (L) 13.0 - 17.0 g/dL  HCT 31.6 (L) 39.0 - 52.0 %   MCV 87.3 78.0 - 100.0 fL   MCH 30.1 26.0 - 34.0 pg   MCHC 34.5 30.0 - 36.0 g/dL   RDW 14.5 11.5 - 15.5 %   Platelets 86 (L) 150 - 400 K/uL    Comment: REPEATED TO VERIFY SPECIMEN CHECKED FOR CLOTS PLATELET COUNT CONFIRMED BY SMEAR   Type and screen [place]     Status: None   Collection Time: 11/01/16  9:04 AM  Result Value Ref Range   ABO/RH(D) O POS    Antibody Screen NEG    Sample Expiration 11/04/2016   POC occult blood, ED Provider will collect     Status: Abnormal   Collection Time: 11/01/16  9:14 AM  Result Value Ref Range   Fecal Occult Bld POSITIVE (A) NEGATIVE  Protime-INR     Status: None   Collection Time: 11/01/16  2:55 PM  Result Value Ref Range   Prothrombin Time 13.3 11.4 - 15.2 seconds   INR 1.01    Review of Systems  Constitutional: Positive for malaise/fatigue. Negative for chills, fever and weight loss.  HENT: Negative.   Eyes: Negative.    Gastrointestinal: Positive for abdominal pain, blood in stool, heartburn, melena and nausea. Negative for constipation and diarrhea.  Genitourinary: Negative.   Musculoskeletal: Negative.   Skin: Negative.   Neurological: Positive for weakness and headaches. Negative for dizziness, tingling, tremors, sensory change, speech change, focal weakness, seizures and loss of consciousness.  Endo/Heme/Allergies: Negative.   Psychiatric/Behavioral: Positive for substance abuse. Negative for depression, hallucinations, memory loss and suicidal ideas. The patient is nervous/anxious. The patient does not have insomnia.    Blood pressure 135/83, pulse 87, temperature 98.4 F (36.9 C), temperature source Oral, resp. rate 12, height _0  (1.727 m), weight 77.1 kg (170 lb), SpO2 98 %. Physical Exam  Constitutional: He is oriented to person, place, and time. He appears well-developed and well-nourished.  HENT:  Head: Normocephalic and atraumatic.  Eyes: Pupils are equal, round, and reactive to light. Conjunctivae and EOM are normal.  Neck: Normal range of motion. Neck supple.  Cardiovascular: Normal rate and regular rhythm.   Respiratory: Effort normal and breath sounds normal.  GI: Soft. Bowel sounds are normal. There is tenderness.  Neurological: He is alert and oriented to person, place, and time.  Skin: Skin is warm and dry.  Psychiatric: He has a normal mood and affect. His behavior is normal. Judgment and thought content normal.   Assessment/Plan: 1) Epigastric pain with melena and anemia-EGD planned for tomorrow-PPI's and clears for now. 2) Drug abuse/Alcohol abuse with thrombocytopenia-watch for withdrawal symptoms-CIWA protocol. 3) Major depressive disorder-On Celexa.  4) Poor social situation-homeless-clinical Education officer, museum has seen him already.  Barry Moore 11/01/2016, 3:50 PM

## 2016-11-01 NOTE — ED Notes (Signed)
Called to give RN report unavaliable, Charged stated call back in 15 minutes.

## 2016-11-01 NOTE — ED Notes (Signed)
Bed: WA20 Expected date:  Expected time:  Means of arrival:  Comments: 51 yo male, abd pain, dark stool

## 2016-11-01 NOTE — ED Notes (Signed)
Pt is alert and oriented x 4 and is verbally responsive. Pt has increased gross tremors. MD made aware

## 2016-11-02 ENCOUNTER — Inpatient Hospital Stay (HOSPITAL_COMMUNITY): Payer: Self-pay | Admitting: Anesthesiology

## 2016-11-02 ENCOUNTER — Encounter (HOSPITAL_COMMUNITY): Payer: Self-pay | Admitting: *Deleted

## 2016-11-02 ENCOUNTER — Encounter (HOSPITAL_COMMUNITY)
Admission: EM | Disposition: A | Discharge status: Home / Self Care | Payer: Self-pay | Source: Home / Self Care | Attending: Family Medicine

## 2016-11-02 DIAGNOSIS — I10 Essential (primary) hypertension: Secondary | ICD-10-CM

## 2016-11-02 DIAGNOSIS — D649 Anemia, unspecified: Secondary | ICD-10-CM

## 2016-11-02 DIAGNOSIS — F1010 Alcohol abuse, uncomplicated: Secondary | ICD-10-CM

## 2016-11-02 DIAGNOSIS — F332 Major depressive disorder, recurrent severe without psychotic features: Secondary | ICD-10-CM

## 2016-11-02 DIAGNOSIS — K7010 Alcoholic hepatitis without ascites: Secondary | ICD-10-CM

## 2016-11-02 DIAGNOSIS — K922 Gastrointestinal hemorrhage, unspecified: Secondary | ICD-10-CM

## 2016-11-02 DIAGNOSIS — D696 Thrombocytopenia, unspecified: Secondary | ICD-10-CM

## 2016-11-02 DIAGNOSIS — Z590 Homelessness: Secondary | ICD-10-CM

## 2016-11-02 HISTORY — PX: ESOPHAGOGASTRODUODENOSCOPY (EGD) WITH PROPOFOL: SHX5813

## 2016-11-02 LAB — CBC
HCT: 29.4 % — ABNORMAL LOW (ref 39.0–52.0)
Hemoglobin: 9.8 g/dL — ABNORMAL LOW (ref 13.0–17.0)
MCH: 29.3 pg (ref 26.0–34.0)
MCHC: 33.3 g/dL (ref 30.0–36.0)
MCV: 87.8 fL (ref 78.0–100.0)
Platelets: 76 K/uL — ABNORMAL LOW (ref 150–400)
RBC: 3.35 MIL/uL — ABNORMAL LOW (ref 4.22–5.81)
RDW: 14.5 % (ref 11.5–15.5)
WBC: 6.7 K/uL (ref 4.0–10.5)

## 2016-11-02 SURGERY — EGD (ESOPHAGOGASTRODUODENOSCOPY)
Anesthesia: Monitor Anesthesia Care

## 2016-11-02 SURGERY — ESOPHAGOGASTRODUODENOSCOPY (EGD) WITH PROPOFOL
Anesthesia: Monitor Anesthesia Care

## 2016-11-02 MED ORDER — PROPOFOL 10 MG/ML IV BOLUS
INTRAVENOUS | Status: AC
  Filled 2016-11-02: qty 40

## 2016-11-02 MED ORDER — LACTATED RINGERS IV SOLN
INTRAVENOUS | Status: DC
  Administered 2016-11-02 (×2): via INTRAVENOUS

## 2016-11-02 MED ORDER — PROPOFOL 500 MG/50ML IV EMUL
INTRAVENOUS | Status: DC | PRN
  Administered 2016-11-02: 150 ug/kg/min via INTRAVENOUS

## 2016-11-02 MED ORDER — PROPOFOL 500 MG/50ML IV EMUL
INTRAVENOUS | Status: DC | PRN
  Administered 2016-11-02 (×2): 40 mg via INTRAVENOUS
  Administered 2016-11-02 (×2): 50 mg via INTRAVENOUS

## 2016-11-02 MED ORDER — PROPOFOL 10 MG/ML IV BOLUS
INTRAVENOUS | Status: AC
  Filled 2016-11-02: qty 20

## 2016-11-02 MED ORDER — HYDRALAZINE HCL 20 MG/ML IJ SOLN
10.0000 mg | INTRAMUSCULAR | Status: DC | PRN
  Filled 2016-11-02: qty 0.5

## 2016-11-02 MED ORDER — SODIUM CHLORIDE 0.9 % IV SOLN: INTRAVENOUS | Status: DC

## 2016-11-02 SURGICAL SUPPLY — 14 items

## 2016-11-02 NOTE — H&P (View-Only) (Signed)
UNASSIGNED PATIENT Reason for Consult: Abdominal pain and black stools. Referring Physician: THP  Jonanthony Nahar is an 51 y.o. male.  HPI: 51 year old white male, with multiple [13] ER visits in the last 3 months. Patient gives a history of acute epigastric pain that started yesterday and worsened this morning. He noticed some dark tarry stools along with the abdominal pain. He has a longstanding history of acid reflux but denies having any dysphagia or odynophagia. He denies having frank hematochezia. He has a longstanding history of alcohol and cocaine abuse. He has had some dry heaves but denies having any nausea or vomiting. He has a good appetite and his weight has been stable. He has a previous history of PUD. He denies the use of NSAIDS. His hemoglobin was 12.5 gm/dl on 10/25/2016.  Past Medical History:  Diagnosis Date  . Alcoholic hepatitis   . Depression   . ETOH abuse   . Hypertension   . Seizures ([place])    Past Surgical History:  Procedure Laterality Date  . HERNIA REPAIR    . LEG SURGERY     Family History  Problem Relation Age of Onset  . Diabetes Mother   . Alcoholism Mother   . Emphysema Father   . Lung cancer Father   . Alcoholism Father    Social History:  reports that he has been smoking Cigarettes.  He has been smoking about 1.00 pack per day. He has never used smokeless tobacco. He reports that he drinks alcohol, four 40 ozs of beer per day. He reports that he uses drugs, including cocaine and cannabis.  Allergies:  Allergies  Allergen Reactions  . Aspirin Nausea And Vomiting   Medications: I have reviewed the patient's current medications.  Results for orders placed or performed during the hospital encounter of 11/01/16 (from the past 48 hour(s))  Lipase, blood     Status: None   Collection Time: 11/01/16  9:00 AM  Result Value Ref Range   Lipase 37 11 - 51 U/L  Comprehensive metabolic panel     Status: Abnormal   Collection Time: 11/01/16  9:00 AM   Result Value Ref Range   Sodium 139 135 - 145 mmol/L   Potassium 3.6 3.5 - 5.1 mmol/L   Chloride 104 101 - 111 mmol/L   CO2 22 22 - 32 mmol/L   Glucose, Bld 89 65 - 99 mg/dL   BUN 13 6 - 20 mg/dL   Creatinine, Ser 0.62 0.61 - 1.24 mg/dL   Calcium 8.3 (L) 8.9 - 10.3 mg/dL   Total Protein 7.1 6.5 - 8.1 g/dL   Albumin 3.9 3.5 - 5.0 g/dL   AST 97 (H) 15 - 41 U/L   ALT 46 17 - 63 U/L   Alkaline Phosphatase 110 38 - 126 U/L   Total Bilirubin 0.7 0.3 - 1.2 mg/dL   GFR calc non Af Amer >60 >60 mL/min   GFR calc Af Amer >60 >60 mL/min    Comment: (NOTE) The eGFR has been calculated using the CKD EPI equation. This calculation has not been validated in all clinical situations. eGFR's persistently <60 mL/min signify possible Chronic Kidney Disease.    Anion gap 13 5 - 15  CBC     Status: Abnormal   Collection Time: 11/01/16  9:00 AM  Result Value Ref Range   WBC 6.6 4.0 - 10.5 K/uL   RBC 3.62 (L) 4.22 - 5.81 MIL/uL   Hemoglobin 10.9 (L) 13.0 - 17.0 g/dL  HCT 31.6 (L) 39.0 - 52.0 %   MCV 87.3 78.0 - 100.0 fL   MCH 30.1 26.0 - 34.0 pg   MCHC 34.5 30.0 - 36.0 g/dL   RDW 14.5 11.5 - 15.5 %   Platelets 86 (L) 150 - 400 K/uL    Comment: REPEATED TO VERIFY SPECIMEN CHECKED FOR CLOTS PLATELET COUNT CONFIRMED BY SMEAR   Type and screen [place]     Status: None   Collection Time: 11/01/16  9:04 AM  Result Value Ref Range   ABO/RH(D) O POS    Antibody Screen NEG    Sample Expiration 11/04/2016   POC occult blood, ED Provider will collect     Status: Abnormal   Collection Time: 11/01/16  9:14 AM  Result Value Ref Range   Fecal Occult Bld POSITIVE (A) NEGATIVE  Protime-INR     Status: None   Collection Time: 11/01/16  2:55 PM  Result Value Ref Range   Prothrombin Time 13.3 11.4 - 15.2 seconds   INR 1.01    Review of Systems  Constitutional: Positive for malaise/fatigue. Negative for chills, fever and weight loss.  HENT: Negative.   Eyes: Negative.    Gastrointestinal: Positive for abdominal pain, blood in stool, heartburn, melena and nausea. Negative for constipation and diarrhea.  Genitourinary: Negative.   Musculoskeletal: Negative.   Skin: Negative.   Neurological: Positive for weakness and headaches. Negative for dizziness, tingling, tremors, sensory change, speech change, focal weakness, seizures and loss of consciousness.  Endo/Heme/Allergies: Negative.   Psychiatric/Behavioral: Positive for substance abuse. Negative for depression, hallucinations, memory loss and suicidal ideas. The patient is nervous/anxious. The patient does not have insomnia.    Blood pressure 135/83, pulse 87, temperature 98.4 F (36.9 C), temperature source Oral, resp. rate 12, height _0  (1.727 m), weight 77.1 kg (170 lb), SpO2 98 %. Physical Exam  Constitutional: He is oriented to person, place, and time. He appears well-developed and well-nourished.  HENT:  Head: Normocephalic and atraumatic.  Eyes: Pupils are equal, round, and reactive to light. Conjunctivae and EOM are normal.  Neck: Normal range of motion. Neck supple.  Cardiovascular: Normal rate and regular rhythm.   Respiratory: Effort normal and breath sounds normal.  GI: Soft. Bowel sounds are normal. There is tenderness.  Neurological: He is alert and oriented to person, place, and time.  Skin: Skin is warm and dry.  Psychiatric: He has a normal mood and affect. His behavior is normal. Judgment and thought content normal.   Assessment/Plan: 1) Epigastric pain with melena and anemia-EGD planned for tomorrow-PPI's and clears for now. 2) Drug abuse/Alcohol abuse with thrombocytopenia-watch for withdrawal symptoms-CIWA protocol. 3) Major depressive disorder-On Celexa.  4) Poor social situation-homeless-clinical Education officer, museum has seen him already.  Shiheem Corporan 11/01/2016, 3:50 PM

## 2016-11-02 NOTE — Anesthesia Preprocedure Evaluation (Addendum)
Anesthesia Evaluation  Patient identified by MRN, date of birth, ID band Patient awake    Reviewed: Allergy & Precautions, NPO status , Patient's Chart, lab work & pertinent test results  Airway Mallampati: II  TM Distance: >3 FB Neck ROM: Full    Dental no notable dental hx. (+) Edentulous Upper, Edentulous Lower   Pulmonary Current Smoker,    Pulmonary exam normal breath sounds clear to auscultation       Cardiovascular hypertension, Normal cardiovascular exam Rhythm:Regular Rate:Normal     Neuro/Psych Seizures -, Well Controlled,  negative psych ROS   GI/Hepatic negative GI ROS, (+)     substance abuse  alcohol use,   Endo/Other  negative endocrine ROS  Renal/GU negative Renal ROS  negative genitourinary   Musculoskeletal negative musculoskeletal ROS (+)   Abdominal   Peds negative pediatric ROS (+)  Hematology negative hematology ROS (+)   Anesthesia Other Findings   Reproductive/Obstetrics negative OB ROS                             Anesthesia Physical Anesthesia Plan  ASA: III  Anesthesia Plan: MAC   Post-op Pain Management:    Induction:   PONV Risk Score and Plan: Treatment may vary due to age or medical condition  Airway Management Planned: Simple Face Mask  Additional Equipment:   Intra-op Plan:   Post-operative Plan:   Informed Consent: I have reviewed the patients History and Physical, chart, labs and discussed the procedure including the risks, benefits and alternatives for the proposed anesthesia with the patient or authorized representative who has indicated his/her understanding and acceptance.   Dental advisory given  Plan Discussed with: CRNA  Anesthesia Plan Comments:         Anesthesia Quick Evaluation

## 2016-11-02 NOTE — Op Note (Signed)
[facility] Patient Name: Barry Moore Procedure Date: 11/02/2016 MRN: [id number] Attending MD: Carol Ada , MD Date of Birth: 1965/09/08 CSN: [id number] Age: 51 Admit Type: Inpatient Procedure:                Upper GI endoscopy Indications:              Melena Providers:                Carol Ada, MD, Laverta Baltimore RN, RN, Cletis Athens, Technician Referring MD:              Medicines:                Propofol per Anesthesia Complications:            No immediate complications. Estimated Blood Loss:     Estimated blood loss: none. Estimated blood loss:                            none. Estimated blood loss was minimal. Procedure:                Pre-Anesthesia Assessment:                           - Prior to the procedure, a History and Physical                            was performed, and patient medications and                            allergies were reviewed. The patient's tolerance of                            previous anesthesia was also reviewed. The risks                            and benefits of the procedure and the sedation                            options and risks were discussed with the patient.                            All questions were answered, and informed consent                            was obtained. Prior Anticoagulants: The patient has                            taken no previous anticoagulant or antiplatelet                            agents. ASA Grade Assessment: III - A patient with  severe systemic disease. After reviewing the risks                            and benefits, the patient was deemed in                            satisfactory condition to undergo the procedure.                           - Sedation was administered by an anesthesia                            professional. Deep sedation was attained.                           After obtaining informed consent, the  endoscope was                            passed under direct vision. Throughout the                            procedure, the patient's blood pressure, pulse, and                            oxygen saturations were monitored continuously. The                            Olympus GIF-[id number] ([id number]) endoscope was                            introduced through the mouth, and advanced to the                            second part of duodenum. The upper GI endoscopy was                            accomplished without difficulty. The patient                            tolerated the procedure well. Scope In: Scope Out: Findings:      A 3 cm hiatal hernia was present.      A non-obstructing and mild Schatzki ring (acquired) was found in the       lower third of the esophagus.      Mild portal hypertensive gastropathy was found in the entire examined       stomach. Biopsies were taken with a cold forceps for Helicobacter pylori       testing.      One non-bleeding cratered duodenal ulcer with no stigmata of bleeding       was found in the duodenal bulb. The lesion was 5 mm in largest dimension.      Segmental moderate inflammation characterized by erythema and       granularity was found in the duodenal bulb. Impression:               - 3 cm  hiatal hernia.                           - Non-obstructing and mild Schatzki ring.                           - Portal hypertensive gastropathy.                           - One non-bleeding duodenal ulcer with no stigmata                            of bleeding.                           - Duodenitis.                           - No specimens collected. Moderate Sedation:      N/A- Per Anesthesia Care Recommendation:           - Patient has a contact number available for                            emergencies. The signs and symptoms of potential                            delayed complications were discussed with the                            patient. Return  to normal activities tomorrow.                            Written discharge instructions were provided to the                            patient.                           - Resume regular diet.                           - Continue present medications.                           - Await pathology results. Procedure Code(s):        --- Professional ---                           [phone], Esophagogastroduodenoscopy, flexible,                            transoral; with biopsy, single or multiple Diagnosis Code(s):        --- Professional ---                           K26.9, Duodenal ulcer, unspecified as acute or  chronic, without hemorrhage or perforation                           K44.9, Diaphragmatic hernia without obstruction or                            gangrene                           K22.2, Esophageal obstruction                           K76.6, Portal hypertension                           K31.89, Other diseases of stomach and duodenum                           K29.80, Duodenitis without bleeding                           K92.1, Melena (includes Hematochezia) CPT copyright 2016 American Medical Association. All rights reserved. The codes documented in this report are preliminary and upon coder review may  be revised to meet current compliance requirements. Carol Ada, MD Carol Ada, MD 11/02/2016 2:38:25 PM This report has been signed electronically. Number of Addenda: 0

## 2016-11-02 NOTE — Interval H&P Note (Signed)
History and Physical Interval Note:  11/02/2016 2:11 PM  Barry Moore  has presented today for surgery, with the diagnosis of melena  The various methods of treatment have been discussed with the patient and family. After consideration of risks, benefits and other options for treatment, the patient has consented to  Procedure(s): ESOPHAGOGASTRODUODENOSCOPY (EGD) WITH PROPOFOL (N/A) as a surgical intervention .  The patient's history has been reviewed, patient examined, no change in status, stable for surgery.  I have reviewed the patient's chart and labs.  Questions were answered to the patient's satisfaction.     Elise Gladden D

## 2016-11-02 NOTE — Transfer of Care (Signed)
Immediate Anesthesia Transfer of Care Note  Patient: Barry Moore  Procedure(s) Performed: Procedure(s): ESOPHAGOGASTRODUODENOSCOPY (EGD) WITH PROPOFOL (N/A)  Patient Location: PACU  Anesthesia Type:MAC  Level of Consciousness: awake, alert  and oriented  Airway & Oxygen Therapy: Patient Spontanous Breathing and Patient connected to nasal cannula oxygen  Post-op Assessment: Report given to RN and Post -op Vital signs reviewed and stable  Post vital signs: Reviewed and stable  Last Vitals:  Vitals:   11/02/16 1200 11/02/16 1345  BP: (!) 148/90 (!) 156/98  Pulse: 80 94  Resp: 17 19  Temp: 36.8 C 37.1 C  SpO2: 99% 98%    Last Pain:  Vitals:   11/02/16 1415  TempSrc:   PainSc: 0-No pain         Complications: No apparent anesthesia complications

## 2016-11-02 NOTE — Care Management Note (Signed)
Case Management Note  Patient Details  Name: Barry Moore MRN: [id number] Date of Birth: 1965-05-05  Subjective/Objective:      Chronic ETOH abuse              Action/Plan: Date:  November 02, 2016  Chart reviewed for concurrent status and case management needs.  Will continue to follow patient progress.  Discharge Planning: following for needs  Expected discharge date: [id number]  Velva Harman, BSN, [place], [place]   Expected Discharge Date:   (unknown)               Expected Discharge Plan:  Homeless Shelter  In-House Referral:  Clinical Social Work  Discharge planning Services  CM Consult  Post Acute Care Choice:    Choice offered to:  Patient  DME Arranged:    DME Agency:     HH Arranged:    [place] Agency:     Status of Service:  Completed, signed off  If discussed at [employer], dates discussed:    Additional Comments:  Leeroy Cha, RN 11/02/2016, 9:23 AM

## 2016-11-02 NOTE — Anesthesia Procedure Notes (Signed)
Date/Time: 11/02/2016 2:14 PM Performed by: Glory Buff Oxygen Delivery Method: Nasal cannula

## 2016-11-02 NOTE — Progress Notes (Signed)
PROGRESS NOTE  Barry Moore  QIW:[id number] DOB: 05/28/1965 DOA: 11/01/2016 PCP: Patient, No Pcp Per   Brief Narrative: Barry Moore is a 51 y.o. male with a history of alcohol and cocaine abuse, depression, and PUD who presented to the ED for abdominal pain and melena, +FOBT in ED, with hemoglobin down to 10.9 from 12.5mg /dl. He was admitted to the step down unit, placed on CIWA protocol, and GI consulted. EGD performed 8/14 showed portal gastropathy, duodenitis and nonbleeding duodenal ulcer.   Assessment & Plan: Principal Problem:   GI bleed Active Problems:   Alcoholic hepatitis without ascites   Thrombocytopenia (HCC)   Homeless   Essential hypertension   Major depressive disorder, recurrent severe without psychotic features ([place])   Alcohol abuse  Melena and acute blood loss anemia: Hgb down to 9.8 this AM. EGD showed duodenal ulcer without stigmata of bleeding, portal gastropathy and duodenitis, no active bleeding. Symptoms have resolved since admission.  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  - CIWA protocol initiated.  - Folate, thiamine, MVM - CSW consulted. Pt is homeless, has had 11 ED visits in past 6 months.  Alcoholic hepatitis: AST mildly elevated, which is chronic, otherwise normal LFTs. No indication for steroids. CT 8/3 showed diffuse fatty infiltration without cirrhotic changes. - Monitor  Thrombocytopenia: Chronic, and dropping without current bleeding. Not getting heparin. Spleen normal on imaging 8/3.  - Monitor with CBC  MDD: 2 [facility] admissions in past 6 months. Currently has flat affect.  - Continue celexa, neurontin - Psychiatry consulted.  Essential HTN: No home medications. - IV hydralazine prn severe HTN  DVT prophylaxis: SCDs Code Status: Full Family Communication: None at bedside Disposition Plan: Monitor CBC and can DC if symptoms remain  resolved and Hgb stabilizes. Will need to monitor closely for EtOH withdrawal overnight  Consultants:   GI, Dr. Benson Norway  Procedures:   EGD 11/02/2016 by Dr. Benson Norway:  - 3 cm hiatal hernia. - Non-obstructing and mild Schatzki ring. - Portal hypertensive gastropathy. - One non-bleeding duodenal ulcer with no stigmata of bleeding. - Duodenitis.  Antimicrobials:  None   Subjective: No BM since admission, denies N/V/hematemesis or other bleeding. No chest pain, dyspnea, dizziness, palpitations or leg swelling.   Objective: BP 119/76   Pulse 82   Temp 98 F (36.7 C) (Oral)   Resp 20   Ht 5\' 7"  (1.702 m)   Wt 76 kg (167 lb 8.8 oz)   SpO2 100%   BMI 26.24 kg/m   General exam: Chronically ill-appearing male in no distress Respiratory system: Non-labored breathing room air. Clear to auscultation bilaterally.  Cardiovascular system: Regular rate and rhythm. No murmur, rub, or gallop. No JVD, and no pedal edema. Gastrointestinal system: Abdomen soft, non-tender, non-distended, with normoactive bowel sounds. No organomegaly or masses felt. Central nervous system: Alert and oriented. No focal neurological deficits, no tremor noted. Extremities: Warm, no deformities Skin: No rashes, lesions no ulcers Psychiatry: Judgement and insight appear normal. Mood & affect appropriate.   CBC:  Recent Labs Lab 11/01/16 0900 11/02/16 0314  WBC 6.6 6.7  HGB 10.9* 9.8*  HCT 31.6* 29.4*  MCV 87.3 87.8  PLT 86* 76*   Basic Metabolic Panel:  Recent Labs Lab 11/01/16 0900  NA 139  K 3.6  CL 104  CO2 22  GLUCOSE 89  BUN 13  CREATININE 0.62  CALCIUM 8.3*  GFR: Estimated Creatinine Clearance: 102.1 mL/min (by C-G formula based on SCr of 0.62 mg/dL). Liver Function Tests:  Recent Labs Lab 11/01/16 0900  AST 97*  ALT 46  ALKPHOS 110  BILITOT 0.7  PROT 7.1  ALBUMIN 3.9    Recent Labs Lab 11/01/16 0900  LIPASE 37   No results for input(s): AMMONIA in the last 168  hours. Coagulation Profile:  Recent Labs Lab 11/01/16 1455  INR 1.01    Time spent: 25 minutes.  Vance Gather, MD [facility] Pager [phone]  If 7PM-7AM, please contact night-coverage [email or web address] Password [facility] 11/02/2016, 3:38 PM

## 2016-11-03 DIAGNOSIS — Z9114 Patient's other noncompliance with medication regimen: Secondary | ICD-10-CM

## 2016-11-03 DIAGNOSIS — Z811 Family history of alcohol abuse and dependence: Secondary | ICD-10-CM

## 2016-11-03 DIAGNOSIS — Z560 Unemployment, unspecified: Secondary | ICD-10-CM

## 2016-11-03 DIAGNOSIS — F17210 Nicotine dependence, cigarettes, uncomplicated: Secondary | ICD-10-CM

## 2016-11-03 DIAGNOSIS — F1490 Cocaine use, unspecified, uncomplicated: Secondary | ICD-10-CM

## 2016-11-03 LAB — CBC
HEMATOCRIT: 29 % — AB (ref 39.0–52.0)
HEMOGLOBIN: 9.9 g/dL — AB (ref 13.0–17.0)
MCH: 30.5 pg (ref 26.0–34.0)
MCHC: 34.1 g/dL (ref 30.0–36.0)
MCV: 89.2 fL (ref 78.0–100.0)
PLATELETS: 93 10*3/uL — AB (ref 150–400)
RBC: 3.25 MIL/uL — AB (ref 4.22–5.81)
RDW: 14.3 % (ref 11.5–15.5)
WBC: 7.9 10*3/uL (ref 4.0–10.5)

## 2016-11-03 LAB — MRSA PCR SCREENING: MRSA by PCR: NEGATIVE

## 2016-11-03 MED ORDER — ESOMEPRAZOLE MAGNESIUM 40 MG PO CPDR: 40.0000 mg | DELAYED_RELEASE_CAPSULE | Freq: Every day | ORAL | 0 refills | Status: DC

## 2016-11-03 MED ORDER — CITALOPRAM HYDROBROMIDE 20 MG PO TABS: 10.0000 mg | ORAL_TABLET | Freq: Every day | ORAL | 0 refills | Status: DC

## 2016-11-03 NOTE — Discharge Summary (Signed)
Physician Discharge Summary  Barry Moore DGU:[id number] DOB: 02/16/1966 DOA: 11/01/2016  PCP: Patient, No Pcp Per  Admit date: 11/01/2016 Discharge date: 11/03/2016  Admitted From: Homeless Disposition: Homeless   Recommendations for Outpatient Follow-up:  1. Follow up with [facility] for continued support. 2. Continued alcohol cessation efforts.   Home Health: None Equipment/Devices: None Discharge Condition: Stable CODE STATUS: Full Diet recommendation: Regular  Brief/Interim Summary: Barry Moore is a 51 y.o. male with a history of alcohol and cocaine abuse, depression, and PUD who presented to the ED for abdominal pain and melena, +FOBT in ED, with hemoglobin down to 10.9 from 12.5mg /dl. He was admitted to the step down unit, placed on CIWA protocol, and GI consulted. EGD performed 8/14 showed portal gastropathy, duodenitis and nonbleeding duodenal ulcer. PPI was continued and hemoglobin remained stable without recurrence of symptoms.   Discharge Diagnoses:  Principal Problem:   GI bleed Active Problems:   Alcoholic hepatitis without ascites   Thrombocytopenia (HCC)   Homeless   Essential hypertension   Major depressive disorder, recurrent severe without psychotic features ([place])   Alcohol abuse  Melena and acute blood loss anemia: Hgb down to 9.8 this AM. EGD showed duodenal ulcer without stigmata of bleeding, portal gastropathy and duodenitis, no active bleeding. 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  - CIWA was provided while inpatient - Folate, thiamine, MVM - CSW consulted. Pt is homeless, has had 11 ED visits in past 6 months. - Please see CSW consult note for more details: Patient is referred to counselor at [facility], [facility] who can provide application process for disability, medical care needed for  nervousness including free medication. Encouraged to stay sober with Alcohol and cocaine and patient agrees with the advice  Alcoholic hepatitis: AST mildly elevated, which is chronic, otherwise normal LFTs. No indication for steroids. CT 8/3 showed diffuse fatty infiltration without cirrhotic changes. - Monitor  Thrombocytopenia: Chronic, stable. Was 20 > 76 > 93. Not getting heparin. Spleen normal on imaging 8/3.  - Monitor with CBC at follow up  MDD: 2 [facility] admissions in past 6 months. Denies SI/HI - Continue celexa, neurontin - Psychiatry consulted: Reestablish outpatient medication management citalopram 10 mg daily for depression and gabapentin 300 mg 3 times daily for anxiety  Essential HTN: No home medications. - IV hydralazine prn severe HTN  Discharge Instructions Discharge Instructions    Discharge instructions    Complete by:  As directed    You were admitted for upper GI bleeding that may have been related to a duodenal ulcer and irritation of the lining of your stomach and small intestine. This is related to your alcohol use. You are stable for discharge with the following recommendations:  - Stop alcohol - Take nexium daily and continue this until you follow up with your doctor - Start back taking celexa for depression - Seek medical attention if your symptoms return     Allergies as of 11/03/2016      Reactions   Aspirin Nausea And Vomiting      Medication List    TAKE these medications   citalopram 20 MG tablet Commonly known as:  CELEXA Take 0.5 tablets (10 mg total) by mouth daily.   esomeprazole 40 MG capsule Commonly known as:  NEXIUM Take 1 capsule (40 mg total) by mouth daily.   gabapentin 300  MG capsule Commonly known as:  NEURONTIN Take 1 capsule (300 mg total) by mouth 3 (three) times daily.   ondansetron 4 MG disintegrating tablet Commonly known as:  ZOFRAN ODT Take 1 tablet (4 mg total) by mouth every 8 (eight) hours as needed for nausea  or vomiting.      Follow-up Information    Placey, Audrea Muscat, NP. Schedule an appointment as soon as possible for a visit.   Why:  Please go to the [facility] and fill out the medical intake form so that Audrea Muscat can start seeing you for your medical needs. Contact information: [place] [address] [phone]          Allergies  Allergen Reactions  . Aspirin Nausea And Vomiting    Consultations:  GI, Dr. Benson Norway  Psychiatry, Dr. Louretta Shorten  Procedures/Studies: Dg Chest 2 View  Result Date: 10/22/2016 CLINICAL DATA:  51 year old male with chest pain and cough. EXAM: CHEST  2 VIEW COMPARISON:  Chest radiograph dated 04/29/2016 FINDINGS: The heart size and mediastinal contours are within normal limits. Both lungs are clear. The visualized skeletal structures are unremarkable. IMPRESSION: No active cardiopulmonary disease. Electronically Signed   By: Anner Crete M.D.   On: 10/22/2016 01:51   Ct Abdomen Pelvis W Contrast  Result Date: 10/22/2016 CLINICAL DATA:  Chest pain for several days. Cough for several years. Nausea, vomiting, and diarrhea for 2 weeks. EXAM: CT ABDOMEN AND PELVIS WITH CONTRAST TECHNIQUE: Multidetector CT imaging of the abdomen and pelvis was performed using the standard protocol following bolus administration of intravenous contrast. CONTRAST:  142mL ISOVUE-300 IOPAMIDOL (ISOVUE-300) INJECTION 61% COMPARISON:  None. FINDINGS: Lower chest: Mild dependent atelectasis in the lung bases. Hepatobiliary: Diffuse fatty infiltration of the liver. No focal liver abnormality is seen. No gallstones, gallbladder wall thickening, or biliary dilatation. Pancreas: Unremarkable. No pancreatic ductal dilatation or surrounding inflammatory changes. Spleen: Normal in size without focal abnormality. Adrenals/Urinary Tract: Adrenal glands are unremarkable. Kidneys are normal, without renal calculi, focal lesion, or hydronephrosis. Bladder is unremarkable. Stomach/Bowel:  Stomach, small bowel, and colon are mostly decompressed. No inflammatory infiltrative changes. No discrete wall thickening appreciated. Appendix is normal. Vascular/Lymphatic: Aortic atherosclerosis. No enlarged abdominal or pelvic lymph nodes. Reproductive: Prostate is unremarkable. Other: No abdominal wall hernia or abnormality. No abdominopelvic ascites. Musculoskeletal: No acute or significant osseous findings. IMPRESSION: No acute process demonstrated in the abdomen or pelvis. No evidence of bowel obstruction or inflammation. Diffuse fatty infiltration of the liver. Aortic atherosclerosis. Electronically Signed   By: Lucienne Capers M.D.   On: 10/22/2016 03:15   Subjective: Pt with mild abdominal pain, improved from previously. No bleeding noted. No dyspnea, dizziness, chest pain, palpitations.   Discharge Exam: Vitals:   11/03/16 1029 11/03/16 1405  BP: 132/86 123/73  Pulse: 74 79  Resp: 18 18  Temp:  99.3 F (37.4 C)  SpO2: 99% 99%   General: Pt is alert, awake, not in acute distress Cardiovascular: RRR, S1/S2 +, no rubs, no gallops Respiratory: CTA bilaterally, no wheezing, no rhonchi Abdominal: Soft, NT, ND, bowel sounds + Extremities: No edema, no cyanosis  Labs: Basic Metabolic Panel:  Recent Labs Lab 11/01/16 0900  NA 139  K 3.6  CL 104  CO2 22  GLUCOSE 89  BUN 13  CREATININE 0.62  CALCIUM 8.3*   Liver Function Tests:  Recent Labs Lab 11/01/16 0900  AST 97*  ALT 46  ALKPHOS 110  BILITOT 0.7  PROT 7.1  ALBUMIN 3.9  Recent Labs Lab 11/01/16 0900  LIPASE 37   CBC:  Recent Labs Lab 11/01/16 0900 11/02/16 0314 11/03/16 0300  WBC 6.6 6.7 7.9  HGB 10.9* 9.8* 9.9*  HCT 31.6* 29.4* 29.0*  MCV 87.3 87.8 89.2  PLT 86* 76* 93*    Time coordinating discharge: Approximately 40 minutes  Vance Gather, MD  [facility] 11/03/2016, 5:35 PM Pager [phone]

## 2016-11-03 NOTE — Progress Notes (Signed)
Received pt as transfer from ICU. Agree with earlier assessment. Voicing no complaints at present. Will continue to monitor. Eulas Post, RN

## 2016-11-03 NOTE — Anesthesia Postprocedure Evaluation (Signed)
Anesthesia Post Note  Patient: Barry Moore  Procedure(s) Performed: Procedure(s) (LRB): ESOPHAGOGASTRODUODENOSCOPY (EGD) WITH PROPOFOL (N/A)     Patient location during evaluation: Endoscopy Anesthesia Type: MAC Level of consciousness: awake and alert Pain management: pain level controlled Vital Signs Assessment: post-procedure vital signs reviewed and stable Respiratory status: spontaneous breathing, nonlabored ventilation, respiratory function stable and patient connected to nasal cannula oxygen Cardiovascular status: stable and blood pressure returned to baseline Anesthetic complications: no    Last Vitals:  Vitals:   11/03/16 0955 11/03/16 1029  BP:  132/86  Pulse: 78 74  Resp: 18 18  Temp:    SpO2: 99% 99%    Last Pain:  Vitals:   11/03/16 1029  TempSrc: Oral  PainSc:                  Montez Hageman

## 2016-11-03 NOTE — Consult Note (Signed)
[facility] Face-to-Face Psychiatry Consult   Reason for Consult:  Depression and alcohol abuse. Referring Physician:  Dr. Bonner Puna Patient Identification: Barry Moore MRN:  [id number] Principal Diagnosis: GI bleed Diagnosis:   Patient Active Problem List   Diagnosis Date Noted  . GI bleed [K92.2] 11/01/2016  . Alcohol abuse [F10.10]   . Major depressive disorder, recurrent severe without psychotic features ([place]) [F33.2] 07/06/2016  . Alcohol-induced mood disorder ([place]) [F10.94] 06/15/2016  . Seizure ([place]) [R56.9] 05/26/2016  . Tobacco abuse [Z72.0] 05/26/2016  . Homeless [Z59.0] 05/26/2016  . Essential hypertension [I10] 05/26/2016  . Alcohol dependence with alcohol-induced mood disorder ([place]) [F10.24] 02/14/2016  . Severe alcohol withdrawal without perceptual disturbances without complication ([place]) [B01.751] 02/14/2016  . Alcoholic hepatitis without ascites [K70.10] 02/14/2016  . Thrombocytopenia ([place]) [D69.6] 02/14/2016    Total Time spent with patient: 1 hour  Subjective:   Barry Moore is a 51 y.o. male patient admitted with depression and alcohol abuse.  HPI:  Barry Moore  is a 51 y.o. male, seen with LCSW, chart reviewed  and case discussed with Dr. Bonner Puna for face-to-face psychiatric consultation and evaluation of depression and alcohol use disorder. Patient stated that he has been staying in the tent, receiving food stamps and has a Social worker at [facility] for services. Reportedly patient participated in [place] for 2 weeks after previous acute psychiatric hospitalization at [facility] in April 2018. Patient reported he did not follow up with the medication management at [facility], saying he does not have money to pay his medications. Patient reported he was able to stay sober for 2 weeks and then relapsed after he found one of his friend overdosed on heroin. Patient also reported he came to the hospital because of black stools. Patient also has a fine hand tremors on bilaterally during  this evaluation. Patient received consultation from the gastroenterology and received endoscopy procedure which indicated peptic ulcers and duodenal ulcers may be the reason for the black stool. Patient reported his smoking 440 ounces of beer for the last 4-5 months. Patient knows he needs to stop drinking because of his acute deterioration and hoping to receive services from his counselor Glennis Brink at [facility], and LCSW provided brochure and educated about services available for people with homelessness. Patient also showing interest to start process of application for disability as not able to work secondary to fall 3 months ago. Patient denied current symptoms of suicidal/homicidal ideation, intention or plans. Patient has no evidence of psychosis.  Review of labs indicated he has blood alcohol level more than 380 on July 9, 26 and 10/22/2016. His urine drug screen is positive for cocaine on 10/15/2016. Patient later injection especially AST was elevated to 97,   His hemoglobin is 9.9 and hematocrit is 29 as of today.   Past Psychiatric History: Patient has been suffering with alcohol use disorder, received inpatient psychiatric hospitalization for alcohol-induced depression and alcohol detox treatment and also referred to alcohol rehabilitation treatment. His last hospitalization at behavioral [facility] was April, 2018.  Risk to Self: Is patient at risk for suicide?: No Risk to Others:   Prior Inpatient Therapy:   Prior Outpatient Therapy:    Past Medical History:  Past Medical History:  Diagnosis Date  . Alcoholic hepatitis   . Depression   . ETOH abuse   . Hypertension   . Seizures ([place])     Past Surgical History:  Procedure Laterality Date  . HERNIA REPAIR    . LEG SURGERY  Family History:  Family History  Problem Relation Age of Onset  . Diabetes Mother   . Alcoholism Mother   . Emphysema Father   . Lung cancer Father   . Alcoholism Father    Family Psychiatric   History: unknown Social History:  History  Alcohol Use  . Yes    Comment: 5th liquor and one Forty daily       History  Drug Use  . Types: Cocaine    Social History   Social History  . Marital status: Divorced    Spouse name: N/A  . Number of children: N/A  . Years of education: N/A   Social History Main Topics  . Smoking status: Current Every Day Smoker    Packs/day: 1.00    Types: Cigarettes  . Smokeless tobacco: Never Used  . Alcohol use Yes     Comment: 5th liquor and one Forty daily    . Drug use: Yes    Types: Cocaine  . Sexual activity: Not Currently   Other Topics Concern  . None   Social History Narrative  . None   Additional Social History:    Allergies:   Allergies  Allergen Reactions  . Aspirin Nausea And Vomiting    Labs:  Results for orders placed or performed during the hospital encounter of 11/01/16 (from the past 48 hour(s))  Protime-INR     Status: None   Collection Time: 11/01/16  2:55 PM  Result Value Ref Range   Prothrombin Time 13.3 11.4 - 15.2 seconds   INR 1.01   CBC     Status: Abnormal   Collection Time: 11/02/16  3:14 AM  Result Value Ref Range   WBC 6.7 4.0 - 10.5 K/uL   RBC 3.35 (L) 4.22 - 5.81 MIL/uL   Hemoglobin 9.8 (L) 13.0 - 17.0 g/dL   HCT 29.4 (L) 39.0 - 52.0 %   MCV 87.8 78.0 - 100.0 fL   MCH 29.3 26.0 - 34.0 pg   MCHC 33.3 30.0 - 36.0 g/dL   RDW 14.5 11.5 - 15.5 %   Platelets 76 (L) 150 - 400 K/uL    Comment: CONSISTENT WITH PREVIOUS RESULT  CBC     Status: Abnormal   Collection Time: 11/03/16  3:00 AM  Result Value Ref Range   WBC 7.9 4.0 - 10.5 K/uL   RBC 3.25 (L) 4.22 - 5.81 MIL/uL   Hemoglobin 9.9 (L) 13.0 - 17.0 g/dL   HCT 29.0 (L) 39.0 - 52.0 %   MCV 89.2 78.0 - 100.0 fL   MCH 30.5 26.0 - 34.0 pg   MCHC 34.1 30.0 - 36.0 g/dL   RDW 14.3 11.5 - 15.5 %   Platelets 93 (L) 150 - 400 K/uL    Comment: CONSISTENT WITH PREVIOUS RESULT    Current Facility-Administered Medications  Medication Dose Route  Frequency Provider Last Rate Last Dose  . citalopram (CELEXA) tablet 10 mg  10 mg Oral Daily Merton Border, MD   10 mg at 11/03/16 0939  . folic acid (FOLVITE) tablet 1 mg  1 mg Oral Daily Merton Border, MD   1 mg at 11/03/16 0939  . gabapentin (NEURONTIN) capsule 300 mg  300 mg Oral TID Merton Border, MD   300 mg at 11/03/16 0814  . hydrALAZINE (APRESOLINE) injection 10 mg  10 mg Intravenous Q4H PRN Patrecia Pour, MD      . LORazepam (ATIVAN) tablet 1 mg  1 mg Oral Q6H PRN Merton Border, MD  1 mg at 11/01/16 2130   Or  . LORazepam (ATIVAN) injection 1 mg  1 mg Intravenous Q6H PRN Merton Border, MD   1 mg at 11/01/16 1321  . multivitamin with minerals tablet 1 tablet  1 tablet Oral Daily Merton Border, MD   1 tablet at 11/03/16 [phone]  . ondansetron (ZOFRAN) tablet 4 mg  4 mg Oral Q6H PRN Merton Border, MD       Or  . ondansetron (ZOFRAN) injection 4 mg  4 mg Intravenous Q6H PRN Merton Border, MD      . pantoprazole (PROTONIX) injection 40 mg  40 mg Intravenous Q12H Merton Border, MD   40 mg at 11/03/16 0939  . thiamine (VITAMIN B-1) tablet 100 mg  100 mg Oral Daily Merton Border, MD   100 mg at 11/03/16 2841   Or  . thiamine (B-1) injection 100 mg  100 mg Intravenous Daily Merton Border, MD   100 mg at 11/02/16 1012  . zolpidem (AMBIEN) tablet 5 mg  5 mg Oral QHS PRN,MR X 1 Merton Border, MD        Musculoskeletal: Strength & Muscle Tone: decreased Gait & Station: unable to stand Patient leans: N/A  Psychiatric Specialty Exam: Physical Exam as per history and physical   ROS endorses drinking alcohol, black stools, nausea and vomiting along with fine hand tremors. Patient is known for alcohol use disorder and received treatment in the past. Patient denied shortness of breath and chest pain. No Fever-chills, No Headache, No changes with Vision or hearing, reports vertigo No problems swallowing food or Liquids, No Chest pain, Cough or Shortness of Breath, No Abdominal pain, No Nausea or Vommitting, Bowel  movements are regular, No Blood in stool or Urine, No dysuria, No new skin rashes or bruises, No new joints pains-aches,  No new weakness, tingling, numbness in any extremity, No recent weight gain or loss, No polyuria, polydypsia or polyphagia,   A full 10 point Review of Systems was done, except as stated above, all other Review of Systems were negative.  Blood pressure 132/86, pulse 74, temperature 98.6 F (37 C), temperature source Oral, resp. rate 18, height 5\' 7"  (1.702 m), weight 76 kg (167 lb 8.8 oz), SpO2 99 %.Body mass index is 26.24 kg/m.  General Appearance: Casual  Eye Contact:  Good  Speech:  Clear and Coherent and Slow  Volume:  Decreased  Mood:  Depressed  Affect:  Constricted and Depressed  Thought Process:  Coherent and Goal Directed  Orientation:  Full (Time, Place, and Person)  Thought Content:  Rumination  Suicidal Thoughts:  No  Homicidal Thoughts:  No  Memory:  Immediate;   Good Recent;   Fair Remote;   Fair  Judgement:  Impaired  Insight:  Fair  Psychomotor Activity:  Decreased and Tremor  Concentration:  Concentration: Fair and Attention Span: Fair  Recall:  Good  Fund of Knowledge:  Good  Language:  Good  Akathisia:  Negative  Handed:  Right  AIMS (if indicated):     Assets:  Communication Skills Desire for Improvement Leisure Time Resilience Social Support Transportation Others:  lives in tent, other wise he is considered homeless.  ADL's:  Intact  Cognition:  WNL  Sleep:        Treatment Plan Summary: 51 years old male with homelessness, noncompliant medications, cocaine abuse and alcohol use disorder presented with nausea, vomiting and black stools. Patient is currently medically stable and has mild fine tremors.  Alcohol use disorder  Cocaine abuse Alcohol-induced mood disorder Noncompliant with medication management Homelessness  Recommendation: Monitor for alcohol withdrawal symptoms, ativan alcohol detox protocol and  CIWA Continue supportive care including thiamine supplementation Reestablishes outpatient medication management citalopram 10 mg daily for depression and gabapentin 300 mg 3 times daily for anxiety Patient has no safety concerns and contract for safety while in the hospital.  Please see CSW consult note for more details: Patient is referred to counselor at [facility], Glennis Brink who can provide application process for disability, medical care needed for nervousness including free medication. Encouraged to stay sober with Alcohol and cocaine and patient agrees with the advice  Disposition: No evidence of imminent risk to self or others at present.   Supportive therapy provided about ongoing stressors.  Ambrose Finland, MD 11/03/2016 12:43 PM

## 2016-11-03 NOTE — Clinical Social Work Note (Signed)
Clinical Social Work Assessment  Patient Details  Name: Barry Moore MRN: [id number] Date of Birth: 01-18-1966  Date of referral:  11/03/16               Reason for consult:                   Permission sought to share information with:    Permission granted to share information::  Yes, Verbal Permission Granted  Name::        Agency::     Relationship::     Contact Information:     Housing/Transportation Living arrangements for the past 2 months:  Homeless Source of Information:  Patient Patient Interpreter Needed:  None Criminal Activity/Legal Involvement Pertinent to Current Situation/Hospitalization:  No Significant Relationships:  Adult Children Lives with:  Self Do you feel safe going back to the place where you live?  Yes Need for family participation in patient care:  No, pt. Is independent.  Care giving concerns: Depression/ Community resources for [employer] / plan:  CSW and psychiatrist met with patient at bedside, explain role and reason for visit. Patient a&O x4 and agreeable to talk. Patient reports he is homeless, lives in tent and plans to return at discharge. Patient was recently at [facility] for depression and etoh use. He was discharged to [facility] for 15 day treatment. Patient reports soon after he started drinking again after learning his best friend overdosed on heroin. Patient reports he has been feeling depressed but denies suicidal or homicidal ideations.  The patient reports he has been working with counselor at the [facility] to find housing and apply for disability. Patient reports, " at times I get inpatient and I cannot never finish what I start." Patient given information for free healthcare through [place] at [facility].  Patient given information about [facility] in past, pt. Has not followed up because he is "inaptient" and cannot wait. Patient reports he will try not to be inpatient with others and follow up for  medication management.   Plan: Follow up with [facility] for continued support.   Employment status:  Unemployed Forensic scientist:  Medicare PT Recommendations:  Not assessed at this time Information / Referral to community resources:  Other [facility])  Patient/Family's Response to care: Agreeable and responding well to care.   Patient/Family's Understanding of and Emotional Response to Diagnosis, Current Treatment, and Prognosis: No family at bedside.  Patient reports, "My stools are black." Patient is concern about internal bleeding. He understands his lack of nutrstion and daily alcohol use is affecting his health.  Patient reports, "I have to stop drinking." Patient is not interested in completing another residential treatment program at this time.   Emotional Assessment Appearance:    Attitude/Demeanor/Rapport:    Affect (typically observed):  Calm, Pleasant Orientation:  Oriented to Self, Oriented to Place, Oriented to  Time, Oriented to Situation Alcohol / Substance use:  Alcohol Use Psych involvement (Current and /or in the community):  No (Comment)  Discharge Needs  Concerns to be addressed:  Discharge Planning Concerns Readmission within the last 30 days:  No Current discharge risk:  None Barriers to Discharge:  Continued Medical Work up   [employer], [place] 11/03/2016, 3:20 PM

## 2016-11-04 ENCOUNTER — Encounter (HOSPITAL_COMMUNITY): Payer: Self-pay | Admitting: Gastroenterology

## 2016-11-06 ENCOUNTER — Encounter (HOSPITAL_COMMUNITY): Payer: Self-pay | Admitting: Emergency Medicine

## 2016-11-06 ENCOUNTER — Emergency Department (HOSPITAL_COMMUNITY)
Admission: EM | Admit: 2016-11-06 | Discharge: 2016-11-07 | Disposition: A | Discharge status: Home / Self Care | Payer: Self-pay | Attending: Emergency Medicine | Admitting: Emergency Medicine

## 2016-11-06 DIAGNOSIS — I10 Essential (primary) hypertension: Secondary | ICD-10-CM | POA: Insufficient documentation

## 2016-11-06 DIAGNOSIS — K920 Hematemesis: Secondary | ICD-10-CM | POA: Insufficient documentation

## 2016-11-06 DIAGNOSIS — D649 Anemia, unspecified: Secondary | ICD-10-CM

## 2016-11-06 DIAGNOSIS — F10929 Alcohol use, unspecified with intoxication, unspecified: Secondary | ICD-10-CM

## 2016-11-06 DIAGNOSIS — F1024 Alcohol dependence with alcohol-induced mood disorder: Secondary | ICD-10-CM | POA: Insufficient documentation

## 2016-11-06 DIAGNOSIS — R45850 Homicidal ideations: Secondary | ICD-10-CM | POA: Insufficient documentation

## 2016-11-06 DIAGNOSIS — F17210 Nicotine dependence, cigarettes, uncomplicated: Secondary | ICD-10-CM | POA: Insufficient documentation

## 2016-11-06 DIAGNOSIS — R45851 Suicidal ideations: Secondary | ICD-10-CM | POA: Insufficient documentation

## 2016-11-06 LAB — COMPREHENSIVE METABOLIC PANEL
ALBUMIN: 3.5 g/dL (ref 3.5–5.0)
ALT: 173 U/L — AB (ref 17–63)
AST: 236 U/L — AB (ref 15–41)
Alkaline Phosphatase: 155 U/L — ABNORMAL HIGH (ref 38–126)
Anion gap: 10 (ref 5–15)
BUN: 6 mg/dL (ref 6–20)
CHLORIDE: 108 mmol/L (ref 101–111)
CO2: 23 mmol/L (ref 22–32)
CREATININE: 0.85 mg/dL (ref 0.61–1.24)
Calcium: 8.8 mg/dL — ABNORMAL LOW (ref 8.9–10.3)
GFR calc non Af Amer: >60 mL/min (ref >60)
GLUCOSE: 110 mg/dL — AB (ref 65–99)
Potassium: 3.5 mmol/L (ref 3.5–5.1)
SODIUM: 141 mmol/L (ref 135–145)
Total Bilirubin: 0.4 mg/dL (ref 0.3–1.2)
Total Protein: 6.8 g/dL (ref 6.5–8.1)

## 2016-11-06 LAB — CBC WITH DIFFERENTIAL/PLATELET
BASOS ABS: 0 10*3/uL (ref 0.0–0.1)
Basophils Relative: 1 %
EOS ABS: 0.3 10*3/uL (ref 0.0–0.7)
EOS PCT: 4 %
HCT: 27.2 % — ABNORMAL LOW (ref 39.0–52.0)
HEMOGLOBIN: 9.1 g/dL — AB (ref 13.0–17.0)
Lymphocytes Relative: 33 %
Lymphs Abs: 1.9 10*3/uL (ref 0.7–4.0)
MCH: 29.8 pg (ref 26.0–34.0)
MCHC: 33.5 g/dL (ref 30.0–36.0)
MCV: 89.2 fL (ref 78.0–100.0)
Monocytes Absolute: 1.3 10*3/uL — ABNORMAL HIGH (ref 0.1–1.0)
Monocytes Relative: 22 %
NEUTROS PCT: 40 %
Neutro Abs: 2.3 10*3/uL (ref 1.7–7.7)
PLATELETS: 217 10*3/uL (ref 150–400)
RBC: 3.05 MIL/uL — ABNORMAL LOW (ref 4.22–5.81)
RDW: 15.6 % — ABNORMAL HIGH (ref 11.5–15.5)
WBC: 5.9 10*3/uL (ref 4.0–10.5)

## 2016-11-06 LAB — RAPID URINE DRUG SCREEN, HOSP PERFORMED
Amphetamines: NOT DETECTED
BARBITURATES: NOT DETECTED
BENZODIAZEPINES: NOT DETECTED
COCAINE: NOT DETECTED
OPIATES: NOT DETECTED
TETRAHYDROCANNABINOL: NOT DETECTED

## 2016-11-06 LAB — LIPASE, BLOOD: Lipase: 71 U/L — ABNORMAL HIGH (ref 11–51)

## 2016-11-06 LAB — SALICYLATE LEVEL: Salicylate Lvl: <7 mg/dL (ref 2.8–30.0)

## 2016-11-06 LAB — ETHANOL: Alcohol, Ethyl (B): 379 mg/dL (ref <5)

## 2016-11-06 LAB — ACETAMINOPHEN LEVEL: Acetaminophen (Tylenol), Serum: <10 ug/mL — ABNORMAL LOW (ref 10–30)

## 2016-11-06 MED ORDER — SODIUM CHLORIDE 0.9 % IV SOLN
INTRAVENOUS | Status: DC
  Administered 2016-11-06: 22:00:00 via INTRAVENOUS

## 2016-11-06 NOTE — ED Provider Notes (Signed)
[facility] DEPT Provider Note   CSN: [id number] Arrival date & time: 11/06/16  2025     History   Chief Complaint No chief complaint on file.   HPI Barry Moore is a 51 y.o. male.  HPI Patient has multiple medical problems including alcohol abuse, depression and a recent hospitalization in the past week for a GI bleed. Patient was found to have an ulcer without any active bleeding. Patient was instructed to avoid any alcohol or drug use. Patient states he was out drinking alcohol today. He was found at the bus stop complaining of homicidal and suicidal ideation. Patient told EMS he wanted to kill himself. He wanted to run into traffic and get hit. EMS also reported that bystanders indicated he may have had a seizure although it is not clear.  Patient is very drowsy here in the emergency room. He admits he has been drinking. He is depressed and does not want to live any longer. Past Medical History:  Diagnosis Date  . Alcoholic hepatitis   . Depression   . ETOH abuse   . Hypertension   . Seizures [facility])     Patient Active Problem List   Diagnosis Date Noted  . GI bleed 11/01/2016  . Alcohol abuse   . Major depressive disorder, recurrent severe without psychotic features ([place]) 07/06/2016  . Alcohol-induced mood disorder ([place]) 06/15/2016  . Seizure ([place]) 05/26/2016  . Tobacco abuse 05/26/2016  . Homeless 05/26/2016  . Essential hypertension 05/26/2016  . Alcohol dependence with alcohol-induced mood disorder ([place]) 02/14/2016  . Severe alcohol withdrawal without perceptual disturbances without complication ([place]) 10/62/6948  . Alcoholic hepatitis without ascites 02/14/2016  . Thrombocytopenia ([place]) 02/14/2016    Past Surgical History:  Procedure Laterality Date  . ESOPHAGOGASTRODUODENOSCOPY (EGD) WITH PROPOFOL N/A 11/02/2016   Procedure: ESOPHAGOGASTRODUODENOSCOPY (EGD) WITH PROPOFOL;  Surgeon: Carol Ada, MD;  Location: [facility] ENDOSCOPY;  Service: Endoscopy;  Laterality:  N/A;  . HERNIA REPAIR    . LEG SURGERY         Home Medications    Prior to Admission medications   Medication Sig Start Date End Date Taking? Authorizing Provider  citalopram (CELEXA) 20 MG tablet Take 0.5 tablets (10 mg total) by mouth daily. Patient not taking: Reported on 11/06/2016 11/03/16   Patrecia Pour, MD  esomeprazole (NEXIUM) 40 MG capsule Take 1 capsule (40 mg total) by mouth daily. Patient not taking: Reported on 11/06/2016 11/03/16   Patrecia Pour, MD  gabapentin (NEURONTIN) 300 MG capsule Take 1 capsule (300 mg total) by mouth 3 (three) times daily. Patient not taking: Reported on 11/01/2016 08/20/16   Patrecia Pour, NP  ondansetron (ZOFRAN ODT) 4 MG disintegrating tablet Take 1 tablet (4 mg total) by mouth every 8 (eight) hours as needed for nausea or vomiting. Patient not taking: Reported on 11/01/2016 10/05/16   Nona Dell, PA-C    Family History Family History  Problem Relation Age of Onset  . Diabetes Mother   . Alcoholism Mother   . Emphysema Father   . Lung cancer Father   . Alcoholism Father     Social History Social History  Substance Use Topics  . Smoking status: Current Every Day Smoker    Packs/day: 1.00    Types: Cigarettes  . Smokeless tobacco: Never Used  . Alcohol use Yes     Comment: 5th liquor and one Forty daily       Allergies   Aspirin   Review of Systems Review of  Systems  All other systems reviewed and are negative.    Physical Exam Updated Vital Signs BP (!) 106/57   Pulse 89   Temp 97.7 F (36.5 C) (Oral)   Resp 16   Ht 1.727 m (5\' 8" )   Wt 77.1 kg (170 lb)   SpO2 94%   BMI 25.85 kg/m   Physical Exam  Constitutional: He appears lethargic. No distress.  HENT:  Head: Normocephalic and atraumatic.  Right Ear: External ear normal.  Left Ear: External ear normal.  Eyes: Conjunctivae are normal. Right eye exhibits no discharge. Left eye exhibits no discharge. No scleral icterus.  Neck: Neck supple. No  tracheal deviation present.  Cardiovascular: Normal rate, regular rhythm and intact distal pulses.   Pulmonary/Chest: Effort normal and breath sounds normal. No stridor. No respiratory distress. He has no wheezes. He has no rales.  Abdominal: Soft. Bowel sounds are normal. He exhibits no distension. There is no tenderness. There is no rebound and no guarding.  Musculoskeletal: He exhibits no edema or tenderness.  Neurological: He appears lethargic. No cranial nerve deficit (no facial droop, extraocular movements intact, no slurred speech) or sensory deficit. He exhibits normal muscle tone. He displays no seizure activity.  The patient is somnolent, his speech is slurred however does follow commands and answer questions after I shake his arm and speak to him loudly  Skin: Skin is warm and dry. No rash noted. He is not diaphoretic.  Psychiatric: He has a normal mood and affect.  Nursing note and vitals reviewed.    ED Treatments / Results  Labs (all labs ordered are listed, but only abnormal results are displayed) Labs Reviewed  CBC WITH DIFFERENTIAL/PLATELET - Abnormal; Notable for the following:       Result Value   RBC 3.05 (*)    Hemoglobin 9.1 (*)    HCT 27.2 (*)    RDW 15.6 (*)    Monocytes Absolute 1.3 (*)    All other components within normal limits  COMPREHENSIVE METABOLIC PANEL - Abnormal; Notable for the following:    Glucose, Bld 110 (*)    Calcium 8.8 (*)    AST 236 (*)    ALT 173 (*)    Alkaline Phosphatase 155 (*)    All other components within normal limits  ETHANOL - Abnormal; Notable for the following:    Alcohol, Ethyl (B) 379 (*)    All other components within normal limits  ACETAMINOPHEN LEVEL - Abnormal; Notable for the following:    Acetaminophen (Tylenol), Serum <10 (*)    All other components within normal limits  LIPASE, BLOOD - Abnormal; Notable for the following:    Lipase 71 (*)    All other components within normal limits  SALICYLATE LEVEL  RAPID  URINE DRUG SCREEN, HOSP PERFORMED     Procedures Procedures (including critical care time)  Medications Ordered in ED Medications  0.9 %  sodium chloride infusion ( Intravenous New Bag/Given 11/06/16 2150)     Initial Impression / Assessment and Plan / ED Course  I have reviewed the triage vital signs and the nursing notes.  Pertinent labs & imaging results that were available during my care of the patient were reviewed by me and considered in my medical decision making (see chart for details).   patient presents to the emergency room with recurrent alcohol intoxication. Unfortunately this is a recurrent issue for the patient. He is seen frequently in the emergency room. Laboratory tests show a stable anemia and  profound alcohol intoxication. The patient complained of suicidal ideation earlier. He will need to be reassessed once he sobers up.  Final Clinical Impressions(s) / ED Diagnoses   Final diagnoses:  Alcoholic intoxication with complication [facility])    New Prescriptions New Prescriptions   No medications on file     Dorie Rank, MD 11/06/16 2318

## 2016-11-06 NOTE — ED Triage Notes (Signed)
Per EMS, pt. Picked up from bus stop with complaint of  HI,SI and possible seizure per friend's report to EMS. ETOH. Pt. Last drink this a.m. With 4 of 40 oz . Pt. Stated to EMS ' I want to kill myself, I run on a traffic earlier but did not get hit." I wanted to kill that person that always annoyed me."  Pt.'s friend he witnessed pt. Had tremors / siezure like activity prior to calling EMS. Alert and oriented upon arrival to ED, calm and cooperative.

## 2016-11-06 NOTE — ED Notes (Signed)
Bed: FT95 Expected date:  Expected time:  Means of arrival:  Comments: 52m states feels like seizure with etoh.

## 2016-11-07 NOTE — ED Provider Notes (Signed)
6:55 AM Patient now awake and alert. He denies suicidal ideation or homicidal ideation now.     Aymee Fomby, Jenny Reichmann, MD 11/07/16 [phone]

## 2016-11-17 ENCOUNTER — Emergency Department (HOSPITAL_COMMUNITY)
Admission: EM | Admit: 2016-11-17 | Discharge: 2016-11-18 | Disposition: A | Discharge status: Home / Self Care | Payer: Self-pay | Attending: Emergency Medicine | Admitting: Emergency Medicine

## 2016-11-17 ENCOUNTER — Encounter (HOSPITAL_COMMUNITY): Payer: Self-pay | Admitting: Emergency Medicine

## 2016-11-17 DIAGNOSIS — F1910 Other psychoactive substance abuse, uncomplicated: Secondary | ICD-10-CM | POA: Insufficient documentation

## 2016-11-17 DIAGNOSIS — F1024 Alcohol dependence with alcohol-induced mood disorder: Secondary | ICD-10-CM | POA: Diagnosis present

## 2016-11-17 DIAGNOSIS — R45850 Homicidal ideations: Secondary | ICD-10-CM | POA: Insufficient documentation

## 2016-11-17 DIAGNOSIS — R45851 Suicidal ideations: Secondary | ICD-10-CM | POA: Insufficient documentation

## 2016-11-17 DIAGNOSIS — F17210 Nicotine dependence, cigarettes, uncomplicated: Secondary | ICD-10-CM | POA: Insufficient documentation

## 2016-11-17 DIAGNOSIS — I10 Essential (primary) hypertension: Secondary | ICD-10-CM | POA: Insufficient documentation

## 2016-11-17 DIAGNOSIS — F10920 Alcohol use, unspecified with intoxication, uncomplicated: Secondary | ICD-10-CM

## 2016-11-17 DIAGNOSIS — F1410 Cocaine abuse, uncomplicated: Secondary | ICD-10-CM | POA: Diagnosis present

## 2016-11-17 LAB — CBC
HCT: 28.4 % — ABNORMAL LOW (ref 39.0–52.0)
HEMOGLOBIN: 9.5 g/dL — AB (ref 13.0–17.0)
MCH: 28.7 pg (ref 26.0–34.0)
MCHC: 33.5 g/dL (ref 30.0–36.0)
MCV: 85.8 fL (ref 78.0–100.0)
PLATELETS: 138 10*3/uL — AB (ref 150–400)
RBC: 3.31 MIL/uL — ABNORMAL LOW (ref 4.22–5.81)
RDW: 15.6 % — ABNORMAL HIGH (ref 11.5–15.5)
WBC: 7.8 10*3/uL (ref 4.0–10.5)

## 2016-11-17 LAB — RAPID URINE DRUG SCREEN, HOSP PERFORMED
Amphetamines: NOT DETECTED
Barbiturates: NOT DETECTED
Benzodiazepines: NOT DETECTED
COCAINE: POSITIVE — AB
OPIATES: NOT DETECTED
TETRAHYDROCANNABINOL: NOT DETECTED

## 2016-11-17 MED ORDER — LORAZEPAM 2 MG/ML IJ SOLN
2.0000 mg | Freq: Once | INTRAMUSCULAR | Status: AC
  Administered 2016-11-17: 2 mg via INTRAVENOUS
  Filled 2016-11-17: qty 1

## 2016-11-17 NOTE — ED Triage Notes (Signed)
Pt brought in by EMS after he was seen having a seizure in his sleeping bag [place] at [place]  Pt is homeless Pt has hx of seizures and is noncompliant with his meds  Pt states he has had general malaise for about a week  Pt also states he has been trying to stop drinking for about a week   Pt also states that he has been having tremors in his arms off and on for about a month   EMS states pt was never postictal with them

## 2016-11-17 NOTE — ED Provider Notes (Addendum)
[place] DEPT Provider Note   CSN: [id number] Arrival date & time: 11/17/16  2243     History   Chief Complaint Chief Complaint  Patient presents with  . Seizures    HPI Barry Moore is a 51 y.o. male.  Patient presents to the ER for evaluation after seizure. Patient thinks he had 3 seizures today. He is brought to the emergency department by ambulance. EMS did not witness any seizure activity or postictal state. Patient reports that he is under stress, has an individual that he wants to kill. He says that he had a plan to kill the person and that kill himself. He is not actively wanting to harm anybody at this time because "I can get my hands on him right now".      Past Medical History:  Diagnosis Date  . Alcoholic hepatitis   . Depression   . ETOH abuse   . Hypertension   . Seizures [facility])     Patient Active Problem List   Diagnosis Date Noted  . GI bleed 11/01/2016  . Alcohol abuse   . Major depressive disorder, recurrent severe without psychotic features ([place]) 07/06/2016  . Alcohol-induced mood disorder ([place]) 06/15/2016  . Seizure ([place]) 05/26/2016  . Tobacco abuse 05/26/2016  . Homeless 05/26/2016  . Essential hypertension 05/26/2016  . Alcohol dependence with alcohol-induced mood disorder ([place]) 02/14/2016  . Severe alcohol withdrawal without perceptual disturbances without complication ([place]) 29/52/8413  . Alcoholic hepatitis without ascites 02/14/2016  . Thrombocytopenia ([place]) 02/14/2016    Past Surgical History:  Procedure Laterality Date  . ESOPHAGOGASTRODUODENOSCOPY (EGD) WITH PROPOFOL N/A 11/02/2016   Procedure: ESOPHAGOGASTRODUODENOSCOPY (EGD) WITH PROPOFOL;  Surgeon: Carol Ada, MD;  Location[facility] END[facility];  Service: Endoscopy;  Laterality: N/A;  . HERNIA REPAIR    . LEG SURGERY         Home Medications    Prior to Admission medications   Not on File    Family History Family History  Problem Relation Age of Onset  . Diabetes  Mother   . Alcoholism Mother   . Emphysema Father   . Lung cancer Father   . Alcoholism Father     Social History Social History  Substance Use Topics  . Smoking status: Current Every Day Smoker    Packs/day: 1.00    Types: Cigarettes  . Smokeless tobacco: Never Used  . Alcohol use Yes     Comment: 5th liquor and one Forty daily       Allergies   Aspirin   Review of Systems Review of Systems  Neurological: Positive for seizures.  Psychiatric/Behavioral: Positive for suicidal ideas.  All other systems reviewed and are negative.    Physical Exam Updated Vital Signs BP 122/82   Pulse 82   Temp 97.9 F (36.6 C) (Oral)   Resp 18   SpO2 96%   Physical Exam  Constitutional: He is oriented to person, place, and time. He appears well-developed and well-nourished. No distress.  HENT:  Head: Normocephalic and atraumatic.  Right Ear: Hearing normal.  Left Ear: Hearing normal.  Nose: Nose normal.  Mouth/Throat: Oropharynx is clear and moist and mucous membranes are normal.  Eyes: Pupils are equal, round, and reactive to light. Conjunctivae and EOM are normal.  Neck: Normal range of motion. Neck supple.  Cardiovascular: Regular rhythm, S1 normal and S2 normal.  Exam reveals no gallop and no friction rub.   No murmur heard. Pulmonary/Chest: Effort normal and breath sounds normal. No respiratory distress. He  exhibits no tenderness.  Abdominal: Soft. Normal appearance and bowel sounds are normal. There is no hepatosplenomegaly. There is no tenderness. There is no rebound, no guarding, no tenderness at McBurney's point and negative Murphy's sign. No hernia.  Musculoskeletal: Normal range of motion.  Neurological: He is alert and oriented to person, place, and time. He has normal strength. No cranial nerve deficit or sensory deficit. Coordination normal. GCS eye subscore is 4. GCS verbal subscore is 5. GCS motor subscore is 6.  Skin: Skin is warm, dry and intact. No rash noted.  No cyanosis.  Psychiatric: He has a normal mood and affect. His speech is normal and behavior is normal. He expresses homicidal and suicidal ideation. He expresses suicidal plans and homicidal plans.  Nursing note and vitals reviewed.    ED Treatments / Results  Labs (all labs ordered are listed, but only abnormal results are displayed) Labs Reviewed  CBC - Abnormal; Notable for the following:       Result Value   RBC 3.31 (*)    Hemoglobin 9.5 (*)    HCT 28.4 (*)    RDW 15.6 (*)    Platelets 138 (*)    All other components within normal limits  COMPREHENSIVE METABOLIC PANEL - Abnormal; Notable for the following:    Calcium 8.6 (*)    AST 111 (*)    Alkaline Phosphatase 136 (*)    All other components within normal limits  ETHANOL - Abnormal; Notable for the following:    Alcohol, Ethyl (B) 355 (*)    All other components within normal limits  RAPID URINE DRUG SCREEN, HOSP PERFORMED - Abnormal; Notable for the following:    Cocaine POSITIVE (*)    All other components within normal limits    EKG  EKG Interpretation  Date/Time:  Wednesday November 17 2016 23:29:30 EDT Ventricular Rate:  98 PR Interval:    QRS Duration: 92 QT Interval:  338 QTC Calculation: 432 R Axis:   99 Text Interpretation:  Right and left arm electrode reversal, interpretation assumes no reversal Sinus rhythm Borderline right axis deviation Probable anteroseptal infarct, old Repol abnrm suggests ischemia, lateral leads Confirmed by Orpah Greek [phone]) on 11/18/2016 12:23:03 AM       Radiology No results found.  Procedures Procedures (including critical care time)  Medications Ordered in ED Medications  LORazepam (ATIVAN) injection 2 mg (2 mg Intravenous Given 11/17/16 2328)     Initial Impression / Assessment and Plan / ED Course  I have reviewed the triage vital signs and the nursing notes.  Pertinent labs & imaging results that were available during my care of the patient were  reviewed by me and considered in my medical decision making (see chart for details).     Patient well-known to the ER for frequent visits for alcohol abuse, polysubstance abuse, homicidality and suicidality. Patient generally retracts his statements after he is sober. He is intoxicated at arrival, alcohol 355. Will allow to sleep and then reevaluate when sober.  He did report that he had seizures earlier today. He reports previous history of seizures. It's not clear if these are alcohol related or if he actually has had 2 seizures in the past. No witnessed episodes here in the ER, EMS report that he was not postictal.  06:20 Addendum -  patient easily awakened this morning. He does not appear to be significantly intoxicated at this time. I once again asked if he was in a danger of harming himself or  others. He thinks he still feels like he wants to harm himself, would like to talk to psychiatry. TTS consult placed.  Final Clinical Impressions(s) / ED Diagnoses   Final diagnoses:  Alcoholic intoxication without complication ([place])  Polysubstance abuse  Homicidal ideation  Suicidal ideation    New Prescriptions New Prescriptions   No medications on file     Orpah Greek, MD 11/18/16 0221    Orpah Greek, MD 11/18/16 [phone]

## 2016-11-18 DIAGNOSIS — F1024 Alcohol dependence with alcohol-induced mood disorder: Secondary | ICD-10-CM

## 2016-11-18 DIAGNOSIS — F1410 Cocaine abuse, uncomplicated: Secondary | ICD-10-CM | POA: Diagnosis present

## 2016-11-18 LAB — COMPREHENSIVE METABOLIC PANEL
ALBUMIN: 3.6 g/dL (ref 3.5–5.0)
ALT: 49 U/L (ref 17–63)
AST: 111 U/L — AB (ref 15–41)
Alkaline Phosphatase: 136 U/L — ABNORMAL HIGH (ref 38–126)
Anion gap: 9 (ref 5–15)
BUN: 7 mg/dL (ref 6–20)
CHLORIDE: 106 mmol/L (ref 101–111)
CO2: 26 mmol/L (ref 22–32)
CREATININE: 0.79 mg/dL (ref 0.61–1.24)
Calcium: 8.6 mg/dL — ABNORMAL LOW (ref 8.9–10.3)
GFR calc Af Amer: >60 mL/min (ref >60)
GFR calc non Af Amer: >60 mL/min (ref >60)
GLUCOSE: 99 mg/dL (ref 65–99)
POTASSIUM: 3.7 mmol/L (ref 3.5–5.1)
SODIUM: 141 mmol/L (ref 135–145)
Total Bilirubin: 0.5 mg/dL (ref 0.3–1.2)
Total Protein: 7 g/dL (ref 6.5–8.1)

## 2016-11-18 LAB — ETHANOL: ALCOHOL ETHYL (B): 355 mg/dL — AB (ref <5)

## 2016-11-18 MED ORDER — LORAZEPAM 1 MG PO TABS
0.0000 mg | ORAL_TABLET | Freq: Two times a day (BID) | ORAL | Status: DC
Start: 2016-11-20 — End: 2016-11-18

## 2016-11-18 MED ORDER — THIAMINE HCL 100 MG/ML IJ SOLN: 100.0000 mg | Freq: Every day | INTRAMUSCULAR | Status: DC

## 2016-11-18 MED ORDER — VITAMIN B-1 100 MG PO TABS
100.0000 mg | ORAL_TABLET | Freq: Every day | ORAL | Status: DC
  Administered 2016-11-18: 100 mg via ORAL
  Filled 2016-11-18: qty 1

## 2016-11-18 MED ORDER — LORAZEPAM 1 MG PO TABS
0.0000 mg | ORAL_TABLET | Freq: Four times a day (QID) | ORAL | Status: DC
Start: 2016-11-18 — End: 2016-11-18
  Administered 2016-11-18: 2 mg via ORAL
  Filled 2016-11-18: qty 2

## 2016-11-18 MED ORDER — NICOTINE 21 MG/24HR TD PT24: 21.0000 mg | MEDICATED_PATCH | Freq: Every day | TRANSDERMAL | Status: DC

## 2016-11-18 MED ORDER — LORAZEPAM 2 MG/ML IJ SOLN: 0.0000 mg | Freq: Four times a day (QID) | INTRAMUSCULAR | Status: DC

## 2016-11-18 MED ORDER — ONDANSETRON HCL 4 MG PO TABS: 4.0000 mg | ORAL_TABLET | Freq: Three times a day (TID) | ORAL | Status: DC | PRN

## 2016-11-18 MED ORDER — ACETAMINOPHEN 325 MG PO TABS: 650.0000 mg | ORAL_TABLET | ORAL | Status: DC | PRN

## 2016-11-18 MED ORDER — LORAZEPAM 2 MG/ML IJ SOLN: 0.0000 mg | Freq: Two times a day (BID) | INTRAMUSCULAR | Status: DC

## 2016-11-18 NOTE — BH Assessment (Signed)
[place] Assessment Progress Note  Per Corena Pilgrim, MD, this pt does not require psychiatric hospitalization at this time.  Pt is to be discharged from [facility] with outpatient referrals.  Discharge instructions include information for [facility], as well information for area substance abuse treatment providers.  They also include information about supportive services for the homeless.  Pt's nurse, Diane, has been notified.  Jalene Mullet, [place] Triage Specialist [phone]
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
## 2016-11-18 NOTE — ED Notes (Signed)
Pt discharged home. Discharged instructions read to pt who verbalized understanding. All belongings returned to pt who signed for same. Denies SI/HI, is not delusional and not responding to internal stimuli. Escorted pt to the ED exit.    

## 2016-11-18 NOTE — Discharge Instructions (Signed)
For your ongoing mental health needs, you are advised to follow up with [facility].  New and returning patients are seen at their walk-in clinic.  Walk-in hours are Monday - Friday from 8:00 am - 3:00 pm.  Walk-in patients are seen on a first come, first served basis.  Try to arrive as early as possible for he best chance of being seen the same day:       [facility]      [address], [place] [address]      [phone]  To help you maintain a sober lifestyle, a substance abuse treatment program may be beneficial to you.  Contact one of the following facilities at your earliest opportunity to ask about enrolling:  RESIDENTIAL PROGRAMS:       [place], [place] [address]      [phone]       [facility]      [address] [place], [place] [address]      [phone]       [facility]      [place], [place] [address]      [phone]  OUTPATIENT PROGRAMS:       [facility]      [place], [place] [address]      [phone]      New patients are seen at the walk-in clinic every Tuesday from 9:00 am - 12:00 pm   For your shelter needs, contact the following service providers:       [facility] ([facility])      [place], [place] [address]      [phone]       [facility]      [address]      [place], [place] [address]      [phone]  For day shelter and other supportive services for the homeless, contact the [facility]):       [place]      [place], [place] [address]      [phone]  For transitional housing, contact one of the following agencies.  They provide longer term housing than a shelter, but there is an application process:       [facility]      [address]      [place], [place]  [address]      [phone]       [facility]. [address], [place] [address]      [phone]
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
## 2016-11-18 NOTE — ED Notes (Signed)
Introduced self to patient. Pt oriented to unit expectations.  Assessed pt for:  A) Anxiety &/or agitation: On admission to the [facility] pt is calm and cooperative, but irritable. He reports alcohol use and homelessness.   S) Safety: Safety maintained with q-15-minute checks and hourly rounds by staff.  A) ADLs: Pt able to perform ADLs independently.  P) Pick-Up (room cleanliness): Pt's room clean and free of clutter.

## 2016-11-22 ENCOUNTER — Emergency Department (HOSPITAL_COMMUNITY): Payer: Self-pay

## 2016-11-22 ENCOUNTER — Encounter (HOSPITAL_COMMUNITY): Payer: Self-pay

## 2016-11-22 ENCOUNTER — Emergency Department (HOSPITAL_COMMUNITY)
Admission: EM | Admit: 2016-11-22 | Discharge: 2016-11-23 | Disposition: A | Discharge status: Home / Self Care | Payer: Self-pay | Attending: Emergency Medicine | Admitting: Emergency Medicine

## 2016-11-22 DIAGNOSIS — I10 Essential (primary) hypertension: Secondary | ICD-10-CM | POA: Insufficient documentation

## 2016-11-22 DIAGNOSIS — Z590 Homelessness: Secondary | ICD-10-CM | POA: Insufficient documentation

## 2016-11-22 DIAGNOSIS — Y908 Blood alcohol level of 240 mg/100 ml or more: Secondary | ICD-10-CM | POA: Insufficient documentation

## 2016-11-22 DIAGNOSIS — F17210 Nicotine dependence, cigarettes, uncomplicated: Secondary | ICD-10-CM | POA: Insufficient documentation

## 2016-11-22 DIAGNOSIS — F10929 Alcohol use, unspecified with intoxication, unspecified: Secondary | ICD-10-CM | POA: Insufficient documentation

## 2016-11-22 LAB — COMPREHENSIVE METABOLIC PANEL
ALK PHOS: 150 U/L — AB (ref 38–126)
ALT: 42 U/L (ref 17–63)
ANION GAP: 9 (ref 5–15)
AST: 126 U/L — ABNORMAL HIGH (ref 15–41)
Albumin: 3.8 g/dL (ref 3.5–5.0)
BILIRUBIN TOTAL: 0.5 mg/dL (ref 0.3–1.2)
BUN: 6 mg/dL (ref 6–20)
CALCIUM: 8.8 mg/dL — AB (ref 8.9–10.3)
CO2: 22 mmol/L (ref 22–32)
CREATININE: 0.8 mg/dL (ref 0.61–1.24)
Chloride: 106 mmol/L (ref 101–111)
GFR calc non Af Amer: >60 mL/min (ref >60)
Glucose, Bld: 117 mg/dL — ABNORMAL HIGH (ref 65–99)
Potassium: 3.4 mmol/L — ABNORMAL LOW (ref 3.5–5.1)
Sodium: 137 mmol/L (ref 135–145)
TOTAL PROTEIN: 7 g/dL (ref 6.5–8.1)

## 2016-11-22 LAB — MAGNESIUM: Magnesium: 2.2 mg/dL (ref 1.7–2.4)

## 2016-11-22 LAB — CBC WITH DIFFERENTIAL/PLATELET
Basophils Absolute: 0.1 10*3/uL (ref 0.0–0.1)
Basophils Relative: 1 %
Eosinophils Absolute: 0.4 10*3/uL (ref 0.0–0.7)
Eosinophils Relative: 4 %
HCT: 29.7 % — ABNORMAL LOW (ref 39.0–52.0)
HEMOGLOBIN: 9.7 g/dL — AB (ref 13.0–17.0)
LYMPHS PCT: 19 %
Lymphs Abs: 1.8 10*3/uL (ref 0.7–4.0)
MCH: 28 pg (ref 26.0–34.0)
MCHC: 32.7 g/dL (ref 30.0–36.0)
MCV: 85.8 fL (ref 78.0–100.0)
MONOS PCT: 15 %
Monocytes Absolute: 1.5 10*3/uL — ABNORMAL HIGH (ref 0.1–1.0)
NEUTROS ABS: 5.8 10*3/uL (ref 1.7–7.7)
NEUTROS PCT: 61 %
Platelets: 91 10*3/uL — ABNORMAL LOW (ref 150–400)
RBC: 3.46 MIL/uL — AB (ref 4.22–5.81)
RDW: 15.9 % — ABNORMAL HIGH (ref 11.5–15.5)
WBC: 9.5 10*3/uL (ref 4.0–10.5)

## 2016-11-22 LAB — RAPID URINE DRUG SCREEN, HOSP PERFORMED
Amphetamines: NOT DETECTED
Barbiturates: NOT DETECTED
Benzodiazepines: NOT DETECTED
Cocaine: POSITIVE — AB
OPIATES: NOT DETECTED
TETRAHYDROCANNABINOL: NOT DETECTED

## 2016-11-22 LAB — ETHANOL: ALCOHOL ETHYL (B): 421 mg/dL — AB (ref <5)

## 2016-11-22 LAB — CBG MONITORING, ED: Glucose-Capillary: 119 mg/dL — ABNORMAL HIGH (ref 65–99)

## 2016-11-22 MED ORDER — LORAZEPAM 2 MG/ML IJ SOLN: 0.0000 mg | Freq: Two times a day (BID) | INTRAMUSCULAR | Status: DC

## 2016-11-22 MED ORDER — LORAZEPAM 2 MG/ML IJ SOLN
0.0000 mg | Freq: Four times a day (QID) | INTRAMUSCULAR | Status: DC
Start: 2016-11-22 — End: 2016-11-23
  Administered 2016-11-23: 1 mg via INTRAVENOUS
  Filled 2016-11-22: qty 1

## 2016-11-22 MED ORDER — THIAMINE HCL 100 MG/ML IJ SOLN
Freq: Once | INTRAVENOUS | Status: AC
  Administered 2016-11-22: 23:00:00 via INTRAVENOUS
  Filled 2016-11-22: qty 1000

## 2016-11-22 MED ORDER — LORAZEPAM 1 MG PO TABS: 0.0000 mg | ORAL_TABLET | Freq: Two times a day (BID) | ORAL | Status: DC

## 2016-11-22 MED ORDER — LORAZEPAM 1 MG PO TABS: 0.0000 mg | ORAL_TABLET | Freq: Four times a day (QID) | ORAL | Status: DC

## 2016-11-22 MED ORDER — LORAZEPAM 2 MG/ML IJ SOLN
1.0000 mg | Freq: Once | INTRAMUSCULAR | Status: AC
  Administered 2016-11-22: 1 mg via INTRAVENOUS
  Filled 2016-11-22: qty 1

## 2016-11-22 NOTE — ED Notes (Signed)
ED Provider at bedside. 

## 2016-11-22 NOTE — ED Notes (Signed)
Patient transported to CT 

## 2016-11-22 NOTE — ED Provider Notes (Signed)
[facility] DEPT Provider Note   CSN: [id number] Arrival date & time: 11/22/16  2137     History   Chief Complaint Chief Complaint  Patient presents with  . Seizures    HPI Barry Moore is a 51 y.o. male.  The history is provided by the patient and medical records. No language interpreter was used.  Seizures   Associated symptoms include headaches.   Barry Moore is a 51 y.o. male  with a PMH of alcohol abuse, alcoholic hepatitis, homelessness, seizures, thrombocytopenia who presents to the Emergency Department complaining of three seizures earlier today. Patient states that he is homeless and tried to "drink myself to death" tonight. He states that a friend told him he started shaking like a seizure and that it lasted a couple minutes then resolved. He then had two similar seizure-like episodes. At some point through these incidents, patient did have bowel incontinence. No bladder incontinence. He notes hx of similar in the past when he drinks too much. No meds prior to arrival for symptoms. Feels tired and has headache at present.  Past Medical History:  Diagnosis Date  . Alcoholic hepatitis   . Depression   . ETOH abuse   . Hypertension   . Seizures [facility])     Patient Active Problem List   Diagnosis Date Noted  . Cocaine abuse 11/18/2016  . GI bleed 11/01/2016  . Alcohol abuse   . Major depressive disorder, recurrent severe without psychotic features ([place]) 07/06/2016  . Alcohol-induced mood disorder ([place]) 06/15/2016  . Seizure ([place]) 05/26/2016  . Tobacco abuse 05/26/2016  . Homeless 05/26/2016  . Essential hypertension 05/26/2016  . Alcohol dependence with alcohol-induced mood disorder ([place]) 02/14/2016  . Severe alcohol withdrawal without perceptual disturbances without complication ([place]) 81/82/9937  . Alcoholic hepatitis without ascites 02/14/2016  . Thrombocytopenia ([place]) 02/14/2016    Past Surgical History:  Procedure Laterality Date  .  ESOPHAGOGASTRODUODENOSCOPY (EGD) WITH PROPOFOL N/A 11/02/2016   Procedure: ESOPHAGOGASTRODUODENOSCOPY (EGD) WITH PROPOFOL;  Surgeon: Carol Ada, MD;  Location[facility];  Service: Endoscopy;  Laterality: N/A;  . HERNIA REPAIR    . LEG SURGERY         Home Medications    Prior to Admission medications   Not on File    Family History Family History  Problem Relation Age of Onset  . Diabetes Mother   . Alcoholism Mother   . Emphysema Father   . Lung cancer Father   . Alcoholism Father     Social History Social History  Substance Use Topics  . Smoking status: Current Every Day Smoker    Packs/day: 1.00    Types: Cigarettes  . Smokeless tobacco: Never Used  . Alcohol use Yes     Comment: 5th liquor and one Forty daily       Allergies   Aspirin   Review of Systems Review of Systems  Constitutional: Positive for fatigue.  Neurological: Positive for seizures and headaches.  All other systems reviewed and are negative.    Physical Exam Updated Vital Signs BP 103/72   Pulse 86   Temp 98.2 F (36.8 C) (Oral)   Resp (!) 22   SpO2 96%   Physical Exam  Constitutional: He appears well-developed and well-nourished. No distress.  HENT:  Head: Normocephalic and atraumatic.  Eyes: Pupils are equal, round, and reactive to light.  + horizontal nystagmus  Cardiovascular: Normal rate, regular rhythm and normal heart sounds.   No murmur heard. Pulmonary/Chest: Effort normal and breath  sounds normal. No respiratory distress. He has no wheezes. He has no rales.  Abdominal: Soft. He exhibits no distension. There is no tenderness.  Neurological:  Alert and oriented x3. Goal-oriented speech. CN 2-12 grossly intact. Normal finger-to-nose and rapid alternating movements. No drift. Strength and sensation intact.  Skin: Skin is warm and dry.  Nursing note and vitals reviewed.    ED Treatments / Results  Labs (all labs ordered are listed, but only abnormal results are  displayed) Labs Reviewed  CBC WITH DIFFERENTIAL/PLATELET - Abnormal; Notable for the following:       Result Value   RBC 3.46 (*)    Hemoglobin 9.7 (*)    HCT 29.7 (*)    RDW 15.9 (*)    Platelets 91 (*)    Monocytes Absolute 1.5 (*)    All other components within normal limits  COMPREHENSIVE METABOLIC PANEL - Abnormal; Notable for the following:    Potassium 3.4 (*)    Glucose, Bld 117 (*)    Calcium 8.8 (*)    AST 126 (*)    Alkaline Phosphatase 150 (*)    All other components within normal limits  ETHANOL - Abnormal; Notable for the following:    Alcohol, Ethyl (B) 421 (*)    All other components within normal limits  RAPID URINE DRUG SCREEN, HOSP PERFORMED - Abnormal; Notable for the following:    Cocaine POSITIVE (*)    All other components within normal limits  CBG MONITORING, ED - Abnormal; Notable for the following:    Glucose-Capillary 119 (*)    All other components within normal limits  MAGNESIUM    EKG  EKG Interpretation None       Radiology Ct Head Wo Contrast  Result Date: 11/22/2016 CLINICAL DATA:  51 y/o M; seizures with history of ETOH, hypertension, and alcoholic hepatitis. EXAM: CT HEAD WITHOUT CONTRAST TECHNIQUE: Contiguous axial images were obtained from the base of the skull through the vertex without intravenous contrast. COMPARISON:  05/26/2016 CT head FINDINGS: Brain: No evidence of acute infarction, hemorrhage, hydrocephalus, extra-axial collection or mass lesion/mass effect. Stable mild chronic microvascular ischemic changes and parenchymal volume loss of the brain. Vascular: Mild calcific atherosclerosis of carotid siphons. No hyperdense vessel identified. Skull: Normal. Negative for fracture or focal lesion. Sinuses/Orbits: No acute finding. Other: None. IMPRESSION: 1. No acute intracranial abnormality identified. 2. Stable mild chronic microvascular ischemic changes and parenchymal volume loss of the brain. Electronically Signed   By: Kristine Garbe M.D.   On: 11/22/2016 23:22    Procedures Procedures (including critical care time)  Medications Ordered in ED Medications  LORazepam (ATIVAN) injection 0-4 mg (0 mg Intravenous Not Given 11/22/16 2313)    Or  LORazepam (ATIVAN) tablet 0-4 mg ( Oral See Alternative 11/22/16 2313)  LORazepam (ATIVAN) injection 0-4 mg (not administered)    Or  LORazepam (ATIVAN) tablet 0-4 mg (not administered)  LORazepam (ATIVAN) injection 1 mg (1 mg Intravenous Given 11/22/16 2258)  sodium chloride 0.9 % 1,000 mL with thiamine 161 mg, folic acid 1 mg, multivitamins adult 10 mL infusion ( Intravenous New Bag/Given 11/22/16 2259)     Initial Impression / Assessment and Plan / ED Course  I have reviewed the triage vital signs and the nursing notes.  Pertinent labs & imaging results that were available during my care of the patient were reviewed by me and considered in my medical decision making (see chart for details).    Barry Moore is a 51 y.o.  male who presents to ED for seizure-like activity. Hx of ETOH abuse and seizures in the past. He states that he is suppose to be on medication for seizures, but unsure of the name and has not been taking them. CN 2-12 intact. Does have horizontal nystagmus on exam. He is alert and oriented x 3. . He endorses that he is homeless and has nothing to live for, therefore tried to "drink myself to death" tonight. ETOH of 421. Labs otherwise baseline. CT head with no acute abnormalities. TTS consulted who state that ETOH must be less than 200 for their assessment. Patient given Ativan upon arrival and placed on CIWA protocol. Care assumed by oncoming provider PA Sanders. Case discussed, plan agreed upon. Will continue to monitor patient and follow up on TTS recommendations.     Final Clinical Impressions(s) / ED Diagnoses   Final diagnoses:  Alcohol use with intoxication [facility])    New Prescriptions New Prescriptions   No medications on file       Ward, [name], PA-C 11/23/16 0101    Little, Wenda Overland, MD 11/23/16 [phone]

## 2016-11-22 NOTE — ED Triage Notes (Signed)
Pt from home ETOH on board, hx of psuedoseizures. Pt admits to drinking 5 40oz beers today. Pt was on the ground drinking with friends and starting having a seizure. Per EMS pt had 4 seizures on the way to the ED. Pt alert, BP 124/86 Hr 90 CBG 136. nad at this time

## 2016-11-23 NOTE — BH Assessment (Signed)
CSW spoke with Minna Merritts, RN, who reports pt is still asleep and was also given ativan.  She asked that TTS assessment wait for a few more hours. Winferd Humphrey, MSW, LCSW Clinical Social Worker 11/23/2016 5:53 AM

## 2016-11-23 NOTE — ED Notes (Signed)
Pt given ginger ale and pocket knife given to security. Pt very cooperative.

## 2016-11-23 NOTE — Progress Notes (Signed)
Per Hughie Closs, NP, she recommends that the patient discharge to a substance abuse treatment facility for detox services.      Patient meets criteria for inpatient substance abuse treatment. CSW faxed referrals to the following inpatient facilities for review:  [facility], [facility]  TTS will continue to seek bed placement.  Radonna Ricker MSW, [place] Disposition [phone]
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
## 2016-11-23 NOTE — ED Provider Notes (Signed)
Pt signed out to me by L Sanders PA-C. He is a 51 year old male who presents with alcohol intoxication and possible seizure activity. No hx of alcohol withdrawal seizure but he was admitted to [facility] back in March for similar presentation. He is also expressing SI stating he drank large amounts of alcohol to kill himself. Vitals are normal at shift change and he is still intoxicated. ETOH is in the 400s as of 10PM last night. Plan is TTS consult in the morning when he is more sober to participate.  Psych recommendations are for outpatient f/u. He has had no seizure activity in the ED. Will d/c.   Recardo Evangelist, PA-C 11/23/16 1458    Merryl Hacker, MD 11/24/16 [phone]

## 2016-11-23 NOTE — ED Provider Notes (Signed)
Assumed care from PA Ward at shift change.  See her note for full H&P.  Briefly, patient reports suicidal ideation with plan to drink himself to death.  Reports he had 3 seizures earlier today.  Labs consistent with alcohol intoxication.  Patient given ativan upon arrival, on CIWA protocol.  Plan:  TTS consult pending-- needs to sober before can be assessed.  Will remains on CIWA protocol.  5:47 AM Patient has been resting here.  Attempted to wake patient, still intoxicated wit some difficulty with cohesive thoughts. Will allow to sober for TTS assessment.  Care will be signed out to morning team to follow-up on this.   Larene Pickett, PA-C 11/23/16 3545    Merryl Hacker, MD 11/24/16 [phone]

## 2016-11-23 NOTE — ED Notes (Signed)
TTs at bedside awaiting cal.

## 2016-12-02 ENCOUNTER — Emergency Department (HOSPITAL_COMMUNITY)
Admission: EM | Admit: 2016-12-02 | Discharge: 2016-12-03 | Disposition: A | Discharge status: Home / Self Care | Payer: Self-pay | Attending: Emergency Medicine | Admitting: Emergency Medicine

## 2016-12-02 ENCOUNTER — Encounter (HOSPITAL_COMMUNITY): Payer: Self-pay

## 2016-12-02 DIAGNOSIS — F17210 Nicotine dependence, cigarettes, uncomplicated: Secondary | ICD-10-CM | POA: Insufficient documentation

## 2016-12-02 DIAGNOSIS — R079 Chest pain, unspecified: Secondary | ICD-10-CM | POA: Insufficient documentation

## 2016-12-02 DIAGNOSIS — F10929 Alcohol use, unspecified with intoxication, unspecified: Secondary | ICD-10-CM | POA: Insufficient documentation

## 2016-12-02 NOTE — ED Triage Notes (Signed)
Pt here for detox, last drink was prior to coming to the ED

## 2016-12-02 NOTE — ED Notes (Signed)
Bed: [facility] Expected date:  Expected time:  Means of arrival:  Comments: 

## 2016-12-03 ENCOUNTER — Emergency Department (HOSPITAL_COMMUNITY): Payer: Self-pay

## 2016-12-03 LAB — I-STAT TROPONIN, ED
TROPONIN I, POC: 0 ng/mL (ref 0.00–0.08)
Troponin i, poc: 0 ng/mL (ref 0.00–0.08)

## 2016-12-03 LAB — I-STAT CHEM 8, ED
BUN: 6 mg/dL (ref 6–20)
Calcium, Ion: 1.02 mmol/L — ABNORMAL LOW (ref 1.15–1.40)
Chloride: 105 mmol/L (ref 101–111)
Creatinine, Ser: 1.1 mg/dL (ref 0.61–1.24)
GLUCOSE: 97 mg/dL (ref 65–99)
HCT: 33 % — ABNORMAL LOW (ref 39.0–52.0)
HEMOGLOBIN: 11.2 g/dL — AB (ref 13.0–17.0)
POTASSIUM: 3.9 mmol/L (ref 3.5–5.1)
Sodium: 142 mmol/L (ref 135–145)
TCO2: 27 mmol/L (ref 22–32)

## 2016-12-03 NOTE — ED Notes (Signed)
Pt walked to the bathroom. 

## 2016-12-03 NOTE — ED Provider Notes (Signed)
[facility] DEPT Provider Note   CSN: [id number] Arrival date & time: 12/02/16  2150     History   Chief Complaint Chief Complaint  Patient presents with  . detox    HPI Barry Moore is a 51 y.o. male.  HPI 51 year old Caucasian male past medical history significant for alcohol withdrawal seizures, hypertension, EtOH abuse, depression presents to the emergency department today wanting detox.patient states he wants help for his alcohol abuse. Wells to quit drinking. Patient has been seen several times in the past for same and is well-known to the ED. On my examination patient does complain of "chest pain". Cannot specify the chest pain. Denies any associated diaphoresis, nausea, emesis. Denies any pain at this time. States that his pain was several hours prior to going to the ED. The patient states that his last drink was several hours prior to going to the ED. Cannot quantify how much he drinks daily. He reports "a lot of alcohol daily". Patient denies any associated withdrawal symptoms at this time.patient requesting food and something to drink.  Denies any associated SI, HI, auditory or visual hallucinations. Does report tobacco use. Denies any other illicit drug use.  Patient denies any cardiac history. Denies any significant family cardiac history. Denies any history of PE/DVT, prolonged immobilizations, recent hospitalizations/surgeries, unilateral leg swelling or calf tenderness. Patient denies any associated exertional chest pain or pleuritic chest pain.  Pt denies any fever, chill, ha, vision changes, lightheadedness, dizziness, congestion, neck pain, sob, cough, abd pain, n/v/d, urinary symptoms, change in bowel habits, melena, hematochezia, lower extremity paresthesias.  Past Medical History:  Diagnosis Date  . Alcoholic hepatitis   . Depression   . ETOH abuse   . Hypertension   . Seizures [facility])     Patient Active Problem List   Diagnosis Date Noted  . Cocaine  abuse 11/18/2016  . GI bleed 11/01/2016  . Alcohol abuse   . Major depressive disorder, recurrent severe without psychotic features ([place]) 07/06/2016  . Alcohol-induced mood disorder ([place]) 06/15/2016  . Seizure ([place]) 05/26/2016  . Tobacco abuse 05/26/2016  . Homeless 05/26/2016  . Essential hypertension 05/26/2016  . Alcohol dependence with alcohol-induced mood disorder ([place]) 02/14/2016  . Severe alcohol withdrawal without perceptual disturbances without complication ([place]) 15/17/6160  . Alcoholic hepatitis without ascites 02/14/2016  . Thrombocytopenia ([place]) 02/14/2016    Past Surgical History:  Procedure Laterality Date  . ESOPHAGOGASTRODUODENOSCOPY (EGD) WITH PROPOFOL N/A 11/02/2016   Procedure: ESOPHAGOGASTRODUODENOSCOPY (EGD) WITH PROPOFOL;  Surgeon: Carol Ada, MD;  Location: [facility] ENDOSCOPY;  Service: Endoscopy;  Laterality: N/A;  . HERNIA REPAIR    . LEG SURGERY         Home Medications    Prior to Admission medications   Not on File    Family History Family History  Problem Relation Age of Onset  . Diabetes Mother   . Alcoholism Mother   . Emphysema Father   . Lung cancer Father   . Alcoholism Father     Social History Social History  Substance Use Topics  . Smoking status: Current Every Day Smoker    Packs/day: 1.00    Types: Cigarettes  . Smokeless tobacco: Never Used  . Alcohol use Yes     Comment: 5th liquor and one Forty daily       Allergies   Aspirin   Review of Systems Review of Systems  Constitutional: Negative for chills and fever.  HENT: Negative for congestion and sore throat.   Eyes: Negative for  visual disturbance.  Respiratory: Negative for cough and shortness of breath.   Cardiovascular: Positive for chest pain. Negative for palpitations and leg swelling.  Gastrointestinal: Negative for abdominal pain, diarrhea, nausea and vomiting.  Genitourinary: Negative for dysuria, flank pain, frequency, hematuria, scrotal swelling,  testicular pain and urgency.  Musculoskeletal: Negative for arthralgias and myalgias.  Skin: Negative for rash.  Neurological: Negative for dizziness, syncope, weakness, light-headedness, numbness and headaches.  Psychiatric/Behavioral: Negative for sleep disturbance. The patient is not nervous/anxious.      Physical Exam Updated Vital Signs BP 118/73 (BP Location: Right Arm)   Pulse 88   Temp 98.4 F (36.9 C) (Oral)   Resp 18   SpO2 96%   Physical Exam  Constitutional: He is oriented to person, place, and time. He appears well-developed and well-nourished.  Non-toxic appearance. No distress.  HENT:  Head: Normocephalic and atraumatic.  Nose: Nose normal.  Mouth/Throat: Oropharynx is clear and moist.  Eyes: Pupils are equal, round, and reactive to light. Conjunctivae are normal. Right eye exhibits no discharge. Left eye exhibits no discharge.  Neck: Normal range of motion. Neck supple. No JVD present. No tracheal deviation present.  Cardiovascular: Normal rate, regular rhythm, normal heart sounds and intact distal pulses.  Exam reveals no gallop and no friction rub.   No murmur heard. Pulmonary/Chest: Effort normal and breath sounds normal. No respiratory distress. He has no wheezes. He has no rales. He exhibits no tenderness.  No hypoxia or tachypnea.  Abdominal: Soft. Bowel sounds are normal. He exhibits no distension. There is no tenderness. There is no rebound and no guarding.  Musculoskeletal: Normal range of motion.  No lower extremity edema or calf tenderness.  Lymphadenopathy:    He has no cervical adenopathy.  Neurological: He is alert and oriented to person, place, and time.  Skin: Skin is warm and dry. Capillary refill takes less than 2 seconds. He is not diaphoretic.  Psychiatric: His behavior is normal. Judgment and thought content normal.  Nursing note and vitals reviewed.    ED Treatments / Results  Labs (all labs ordered are listed, but only abnormal results  are displayed) Labs Reviewed  I-STAT CHEM 8, ED - Abnormal; Notable for the following:       Result Value   Calcium, Ion 1.02 (*)    Hemoglobin 11.2 (*)    HCT 33.0 (*)    All other components within normal limits  I-STAT TROPONIN, ED  I-STAT TROPONIN, ED    EKG  EKG Interpretation  Date/Time:  Friday December 03 2016 00:41:27 EDT Ventricular Rate:  88 PR Interval:    QRS Duration: 97 QT Interval:  360 QTC Calculation: 436 R Axis:   86 Text Interpretation:  Sinus rhythm Confirmed by Dory Horn) on 12/03/2016 7:14:05 AM       Radiology Dg Chest 2 View  Result Date: 12/03/2016 CLINICAL DATA:  Mid chest pain today. EXAM: CHEST  2 VIEW COMPARISON:  Radiographs 10/22/2016 FINDINGS: The cardiomediastinal contours are normal. Chronic hyperinflation and bronchitic change. Pulmonary vasculature is normal. No consolidation, pleural effusion, or pneumothorax. Remote left clavicle fracture. No acute osseous abnormalities are seen. Mild scoliotic curvature of the spine, stable from prior. IMPRESSION: No active cardiopulmonary disease. Electronically Signed   By: Jeb Levering M.D.   On: 12/03/2016 00:57    Procedures Procedures (including critical care time)  Medications Ordered in ED Medications - No data to display   Initial Impression / Assessment and Plan / ED Course  I have reviewed the triage vital signs and the nursing notes.  Pertinent labs & imaging results that were available during my care of the patient were reviewed by me and considered in my medical decision making (see chart for details).     Patient presents to the ED with alcohol intoxication, and chest pain. Chest pain sounds atypical. Denies any current symptoms at this time. Patient is intoxicated and smells of alcohol. Patient is well-known to the emergency department and has been seen several times in the past for same. Patient denies any associated symptoms of fever, cough, diaphoresis, nausea,  emesis, shortness of breath.  eKG showed normal sinus rhythm. I-STAT troponin is negative. I-STAT chem 8 is reassuring. Patient's chest pain seems atypical. Clinical presentation does not seem concerning for PE/ACS/dissection. Heart pathway score is 3. Pt will need repeat trop and reassessment to rule clinically sober before discharge.   The patient does not appear to be withdrawing from alcohol this time.denies any associated SI, HI, does not appear to be responding to internal stimuli.  Patient would like detox however patient can be seen in the outpatient setting for this as he has been seen several times in the past for same and continues to drink.  Care handoff to [place]. Pt has pending at this time repeat trop.  Disposition likely home being the pending lab and test results. Care dicussed and plan agreed upon with oncoming PA. Pt updated on plan of care and is currently hemodynamically stable at this time with normal vs.    Dicussed with Dr. Randal Buba who is agreeable to the above plan.   Final Clinical Impressions(s) / ED Diagnoses   Final diagnoses:  Alcoholic intoxication with complication [facility])  Chest pain, unspecified type    New Prescriptions New Prescriptions   No medications on file     Aaron Edelman 12/03/16 [place], April, MD 12/03/16 [phone]

## 2016-12-03 NOTE — Discharge Instructions (Addendum)
Return to ER for new or worsening symptoms, any additional concerns.  °

## 2016-12-03 NOTE — ED Provider Notes (Signed)
Care assumed from previous provider PA Imogene. Please see note for further details. Case discussed, plan agreed upon. Will follow up on 2nd troponin. If negative, patient safe for discharge once clinically sober.   2nd troponin negative.   Patient has eaten multiple trays of food, ambulated independently with steady gait. Patient re-evaluated with clear speech. Clinically sober and discharged home in stable condition.     Tonishia Steffy, [name], PA-C 12/03/16 4210    Charlesetta Shanks, MD 12/07/16 [phone]

## 2016-12-03 NOTE — ED Notes (Signed)
Spoke with ED Provider, once patient can walk and is sober pt may be discharged.

## 2016-12-04 ENCOUNTER — Encounter (HOSPITAL_COMMUNITY): Payer: Self-pay

## 2016-12-04 ENCOUNTER — Emergency Department (HOSPITAL_COMMUNITY)
Admission: EM | Admit: 2016-12-04 | Discharge: 2016-12-05 | Disposition: A | Discharge status: Home / Self Care | Payer: Self-pay | Attending: Emergency Medicine | Admitting: Emergency Medicine

## 2016-12-04 DIAGNOSIS — R1010 Upper abdominal pain, unspecified: Secondary | ICD-10-CM | POA: Insufficient documentation

## 2016-12-04 DIAGNOSIS — F1010 Alcohol abuse, uncomplicated: Secondary | ICD-10-CM

## 2016-12-04 DIAGNOSIS — F17210 Nicotine dependence, cigarettes, uncomplicated: Secondary | ICD-10-CM | POA: Insufficient documentation

## 2016-12-04 DIAGNOSIS — F10129 Alcohol abuse with intoxication, unspecified: Secondary | ICD-10-CM | POA: Insufficient documentation

## 2016-12-04 LAB — COMPREHENSIVE METABOLIC PANEL
ALT: 42 U/L (ref 17–63)
AST: 105 U/L — AB (ref 15–41)
Albumin: 4 g/dL (ref 3.5–5.0)
Alkaline Phosphatase: 126 U/L (ref 38–126)
Anion gap: 12 (ref 5–15)
BUN: 6 mg/dL (ref 6–20)
CALCIUM: 9.4 mg/dL (ref 8.9–10.3)
CO2: 24 mmol/L (ref 22–32)
CREATININE: 0.99 mg/dL (ref 0.61–1.24)
Chloride: 101 mmol/L (ref 101–111)
GFR calc Af Amer: >60 mL/min (ref >60)
Glucose, Bld: 99 mg/dL (ref 65–99)
Potassium: 3.8 mmol/L (ref 3.5–5.1)
Sodium: 137 mmol/L (ref 135–145)
TOTAL PROTEIN: 7.9 g/dL (ref 6.5–8.1)
Total Bilirubin: 0.6 mg/dL (ref 0.3–1.2)

## 2016-12-04 LAB — LIPASE, BLOOD: Lipase: 51 U/L (ref 11–51)

## 2016-12-04 LAB — CBC
HCT: 31.7 % — ABNORMAL LOW (ref 39.0–52.0)
Hemoglobin: 10.5 g/dL — ABNORMAL LOW (ref 13.0–17.0)
MCH: 27.6 pg (ref 26.0–34.0)
MCHC: 33.1 g/dL (ref 30.0–36.0)
MCV: 83.4 fL (ref 78.0–100.0)
PLATELETS: 183 10*3/uL (ref 150–400)
RBC: 3.8 MIL/uL — ABNORMAL LOW (ref 4.22–5.81)
RDW: 16.4 % — AB (ref 11.5–15.5)
WBC: 9.1 10*3/uL (ref 4.0–10.5)

## 2016-12-04 LAB — URINALYSIS, ROUTINE W REFLEX MICROSCOPIC
Bilirubin Urine: NEGATIVE
GLUCOSE, UA: NEGATIVE mg/dL
Hgb urine dipstick: NEGATIVE
Ketones, ur: NEGATIVE mg/dL
LEUKOCYTES UA: NEGATIVE
NITRITE: NEGATIVE
PROTEIN: NEGATIVE mg/dL
Specific Gravity, Urine: 1.002 — ABNORMAL LOW (ref 1.005–1.030)
pH: 6 (ref 5.0–8.0)

## 2016-12-04 NOTE — ED Notes (Signed)
Pt cursing and asking for food refuses to get into hospital gown.

## 2016-12-04 NOTE — ED Triage Notes (Signed)
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

## 2016-12-04 NOTE — ED Notes (Signed)
Bed: JK82 Expected date:  Expected time:  Means of arrival:  Comments: 51 yo m/ vomiting/diarrhea

## 2016-12-04 NOTE — ED Notes (Addendum)
No respiratory or acute distress noted resting in bed with eyes closed call light in reach no seizure activity noted seizure precautions in effect.

## 2016-12-04 NOTE — ED Provider Notes (Addendum)
[facility] DEPT Provider Note   CSN: [id number] Arrival date & time: 12/04/16  2029     History   Chief Complaint Chief Complaint  Patient presents with  . Withdrawal    HPI Barry Moore is a 51 y.o. male.  Patient is a 51 year old male with a history of alcohol abuse and alcohol withdrawal seizures who presents with intoxication and abdominal pain.  There is a questionable seizure prior to patient being evaluated by EMS but this is not confirmed by the patient. He states he's not sure why he is here in the emergency room although he does have some abdominal pain. He states he also had some associated vomiting. He denies any fevers. He currently denies any other complaints.      Past Medical History:  Diagnosis Date  . Alcoholic hepatitis   . Depression   . ETOH abuse   . Hypertension   . Seizures [facility])     Patient Active Problem List   Diagnosis Date Noted  . Cocaine abuse 11/18/2016  . GI bleed 11/01/2016  . Alcohol abuse   . Major depressive disorder, recurrent severe without psychotic features ([place]) 07/06/2016  . Alcohol-induced mood disorder ([place]) 06/15/2016  . Seizure ([place]) 05/26/2016  . Tobacco abuse 05/26/2016  . Homeless 05/26/2016  . Essential hypertension 05/26/2016  . Alcohol dependence with alcohol-induced mood disorder ([place]) 02/14/2016  . Severe alcohol withdrawal without perceptual disturbances without complication ([place]) 40/34/7425  . Alcoholic hepatitis without ascites 02/14/2016  . Thrombocytopenia ([place]) 02/14/2016    Past Surgical History:  Procedure Laterality Date  . ESOPHAGOGASTRODUODENOSCOPY (EGD) WITH PROPOFOL N/A 11/02/2016   Procedure: ESOPHAGOGASTRODUODENOSCOPY (EGD) WITH PROPOFOL;  Surgeon: Carol Ada, MD;  Location[facility];  Service: Endoscopy;  Laterality: N/A;  . HERNIA REPAIR    . LEG SURGERY         Home Medications    Prior to Admission medications   Not on File    Family History Family History    Problem Relation Age of Onset  . Diabetes Mother   . Alcoholism Mother   . Emphysema Father   . Lung cancer Father   . Alcoholism Father     Social History Social History  Substance Use Topics  . Smoking status: Current Every Day Smoker    Packs/day: 1.00    Types: Cigarettes  . Smokeless tobacco: Never Used  . Alcohol use Yes     Comment: 5th liquor and one Forty daily       Allergies   Aspirin   Review of Systems Review of Systems  Constitutional: Negative for chills, diaphoresis, fatigue and fever.  HENT: Negative for congestion, rhinorrhea and sneezing.   Eyes: Negative.   Respiratory: Negative for cough, chest tightness and shortness of breath.   Cardiovascular: Negative for chest pain and leg swelling.  Gastrointestinal: Positive for abdominal pain, nausea and vomiting. Negative for blood in stool and diarrhea.  Genitourinary: Negative for difficulty urinating, flank pain, frequency and hematuria.  Musculoskeletal: Negative for arthralgias and back pain.  Skin: Negative for rash.  Neurological: Negative for dizziness, speech difficulty, weakness, numbness and headaches.     Physical Exam Updated Vital Signs BP 126/75 (BP Location: Right Arm)   Pulse 92   Temp 98.2 F (36.8 C) (Oral)   Resp 18   Ht 5\' 8"  (1.727 m)   Wt 77.1 kg (170 lb)   SpO2 96%   BMI 25.85 kg/m   Physical Exam  Constitutional: He is oriented to person,  place, and time. He appears well-developed and well-nourished.  Patient is disheveled  HENT:  Head: Normocephalic and atraumatic.  Eyes: Pupils are equal, round, and reactive to light.  Neck: Normal range of motion. Neck supple.  Cardiovascular: Normal rate, regular rhythm and normal heart sounds.   Pulmonary/Chest: Effort normal and breath sounds normal. No respiratory distress. He has no wheezes. He has no rales. He exhibits no tenderness.  Abdominal: Soft. Bowel sounds are normal. There is tenderness (mild tenderness across his  upper abdomen). There is no rebound and no guarding.  Musculoskeletal: Normal range of motion. He exhibits no edema.  Lymphadenopathy:    He has no cervical adenopathy.  Neurological: He is alert and oriented to person, place, and time.  Skin: Skin is warm and dry. No rash noted.  Psychiatric: He has a normal mood and affect.     ED Treatments / Results  Labs (all labs ordered are listed, but only abnormal results are displayed) Labs Reviewed  COMPREHENSIVE METABOLIC PANEL - Abnormal; Notable for the following:       Result Value   AST 105 (*)    All other components within normal limits  CBC - Abnormal; Notable for the following:    RBC 3.80 (*)    Hemoglobin 10.5 (*)    HCT 31.7 (*)    RDW 16.4 (*)    All other components within normal limits  URINALYSIS, ROUTINE W REFLEX MICROSCOPIC - Abnormal; Notable for the following:    Color, Urine STRAW (*)    Specific Gravity, Urine 1.002 (*)    All other components within normal limits  LIPASE, BLOOD    EKG  EKG Interpretation None       Radiology Dg Chest 2 View  Result Date: 12/03/2016 CLINICAL DATA:  Mid chest pain today. EXAM: CHEST  2 VIEW COMPARISON:  Radiographs 10/22/2016 FINDINGS: The cardiomediastinal contours are normal. Chronic hyperinflation and bronchitic change. Pulmonary vasculature is normal. No consolidation, pleural effusion, or pneumothorax. Remote left clavicle fracture. No acute osseous abnormalities are seen. Mild scoliotic curvature of the spine, stable from prior. IMPRESSION: No active cardiopulmonary disease. Electronically Signed   By: Jeb Levering M.D.   On: 12/03/2016 00:57    Procedures Procedures (including critical care time)  Medications Ordered in ED Medications - No data to display   Initial Impression / Assessment and Plan / ED Course  I have reviewed the triage vital signs and the nursing notes.  Pertinent labs & imaging results that were available during my care of the patient  were reviewed by me and considered in my medical decision making (see chart for details).     Patient is a 51 year old male who is a frequent visitor to the emergency department for alcohol abuse. He appears to be intoxicated. His abdomen exam is non-concerning. His labs are similar to prior values. He will need to be observed until clinically sober.  Dr. Randal Buba to take over care.  Final Clinical Impressions(s) / ED Diagnoses   Final diagnoses:  Alcohol abuse  Pain of upper abdomen    New Prescriptions New Prescriptions   No medications on file     Malvin Johns, MD 12/04/16 [id number]    Malvin Johns, MD 12/04/16 2333

## 2016-12-04 NOTE — ED Notes (Signed)
No respiratory or acute distress noted resting in bed with eyes closed call light in reach. 

## 2016-12-04 NOTE — ED Notes (Signed)
Seizure precaution in effect with padded bed rails, suction, and call light in reach.

## 2016-12-05 NOTE — ED Notes (Signed)
No respiratory or acute distress noted resting in bed with eyes closed no seizure activity noted seizure precautions in effect call light in reach.

## 2016-12-05 NOTE — ED Notes (Signed)
No respiratory or acute distress noted resting in bed with eyes closed call light in reach. 

## 2016-12-05 NOTE — Discharge Instructions (Signed)
Please see attached resource guide for shelters in the area.   Decrease alcohol consumption.   Return to ER for new or worsening symptoms, any additional concerns.

## 2016-12-05 NOTE — ED Notes (Addendum)
Pt ambulated over 30 feet w/o assist.   Pt showed no signs of distress.  Pt sitting on side of bed eating.

## 2016-12-05 NOTE — ED Provider Notes (Signed)
Patient in ED for ETOH use. See prior provider H&P for full history. Plan to observe until clinically sober. Patient evaluated.   Gen: afebrile, VSS HEENT: Atraumatic, EOMI Resp: no resp distress CV: RRR Abd: soft, ND. Mild epigastric tenderness without rebound or guarding.  MsK: moving all extremities well Neuro: A&O x4  Patient endorses being hungry. Otherwise no complaints. Patient ambulated with steady gait. Given sandwich and tolerated well. Resource guides for homeless shelters provided. All questions answered.    Barry Moore, [name], PA-C 12/05/16 [name], [name], MD 12/05/16 1536

## 2016-12-12 ENCOUNTER — Emergency Department (HOSPITAL_COMMUNITY)
Admission: EM | Admit: 2016-12-12 | Discharge: 2016-12-13 | Disposition: A | Discharge status: Home / Self Care | Payer: Self-pay | Attending: Emergency Medicine | Admitting: Emergency Medicine

## 2016-12-12 ENCOUNTER — Emergency Department (HOSPITAL_COMMUNITY): Payer: Self-pay

## 2016-12-12 ENCOUNTER — Encounter (HOSPITAL_COMMUNITY): Payer: Self-pay | Admitting: Emergency Medicine

## 2016-12-12 DIAGNOSIS — I10 Essential (primary) hypertension: Secondary | ICD-10-CM | POA: Insufficient documentation

## 2016-12-12 DIAGNOSIS — R079 Chest pain, unspecified: Secondary | ICD-10-CM | POA: Insufficient documentation

## 2016-12-12 DIAGNOSIS — K279 Peptic ulcer, site unspecified, unspecified as acute or chronic, without hemorrhage or perforation: Secondary | ICD-10-CM | POA: Insufficient documentation

## 2016-12-12 DIAGNOSIS — R112 Nausea with vomiting, unspecified: Secondary | ICD-10-CM | POA: Insufficient documentation

## 2016-12-12 DIAGNOSIS — F1410 Cocaine abuse, uncomplicated: Secondary | ICD-10-CM | POA: Insufficient documentation

## 2016-12-12 DIAGNOSIS — F17210 Nicotine dependence, cigarettes, uncomplicated: Secondary | ICD-10-CM | POA: Insufficient documentation

## 2016-12-12 DIAGNOSIS — F1910 Other psychoactive substance abuse, uncomplicated: Secondary | ICD-10-CM

## 2016-12-12 DIAGNOSIS — K859 Acute pancreatitis without necrosis or infection, unspecified: Secondary | ICD-10-CM | POA: Insufficient documentation

## 2016-12-12 DIAGNOSIS — K861 Other chronic pancreatitis: Secondary | ICD-10-CM | POA: Insufficient documentation

## 2016-12-12 DIAGNOSIS — Z79899 Other long term (current) drug therapy: Secondary | ICD-10-CM | POA: Insufficient documentation

## 2016-12-12 LAB — COMPREHENSIVE METABOLIC PANEL
ALT: 59 U/L (ref 17–63)
AST: 138 U/L — ABNORMAL HIGH (ref 15–41)
Albumin: 3.9 g/dL (ref 3.5–5.0)
Alkaline Phosphatase: 128 U/L — ABNORMAL HIGH (ref 38–126)
Anion gap: 11 (ref 5–15)
BILIRUBIN TOTAL: 0.3 mg/dL (ref 0.3–1.2)
BUN: 5 mg/dL — AB (ref 6–20)
CHLORIDE: 106 mmol/L (ref 101–111)
CO2: 23 mmol/L (ref 22–32)
CREATININE: 0.77 mg/dL (ref 0.61–1.24)
Calcium: 8.6 mg/dL — ABNORMAL LOW (ref 8.9–10.3)
Glucose, Bld: 103 mg/dL — ABNORMAL HIGH (ref 65–99)
Potassium: 3.7 mmol/L (ref 3.5–5.1)
Sodium: 140 mmol/L (ref 135–145)
TOTAL PROTEIN: 7.2 g/dL (ref 6.5–8.1)

## 2016-12-12 LAB — CBC
HCT: 32.7 % — ABNORMAL LOW (ref 39.0–52.0)
Hemoglobin: 10.5 g/dL — ABNORMAL LOW (ref 13.0–17.0)
MCH: 26.9 pg (ref 26.0–34.0)
MCHC: 32.1 g/dL (ref 30.0–36.0)
MCV: 83.6 fL (ref 78.0–100.0)
PLATELETS: 159 10*3/uL (ref 150–400)
RBC: 3.91 MIL/uL — AB (ref 4.22–5.81)
RDW: 16.4 % — AB (ref 11.5–15.5)
WBC: 5.9 10*3/uL (ref 4.0–10.5)

## 2016-12-12 LAB — LIPASE, BLOOD: LIPASE: 93 U/L — AB (ref 11–51)

## 2016-12-12 LAB — TROPONIN I

## 2016-12-12 MED ORDER — GI COCKTAIL ~~LOC~~
30.0000 mL | Freq: Once | ORAL | Status: AC
  Administered 2016-12-12: 30 mL via ORAL
  Filled 2016-12-12: qty 30

## 2016-12-12 MED ORDER — HYDROMORPHONE HCL 1 MG/ML IJ SOLN: 1.0000 mg | Freq: Once | INTRAMUSCULAR | Status: DC

## 2016-12-12 MED ORDER — LORAZEPAM 2 MG/ML IJ SOLN
1.0000 mg | Freq: Once | INTRAMUSCULAR | Status: AC
  Administered 2016-12-12: 1 mg via INTRAVENOUS
  Filled 2016-12-12: qty 1

## 2016-12-12 MED ORDER — PANTOPRAZOLE SODIUM 40 MG IV SOLR
40.0000 mg | Freq: Once | INTRAVENOUS | Status: AC
  Administered 2016-12-12: 40 mg via INTRAVENOUS
  Filled 2016-12-12: qty 40

## 2016-12-12 MED ORDER — CLOPIDOGREL BISULFATE 75 MG PO TABS
75.0000 mg | ORAL_TABLET | Freq: Once | ORAL | Status: AC
  Administered 2016-12-13: 75 mg via ORAL
  Filled 2016-12-12: qty 1

## 2016-12-12 MED ORDER — LIDOCAINE VISCOUS 2 % MT SOLN: 15.0000 mL | Freq: Once | OROMUCOSAL | Status: DC

## 2016-12-12 MED ORDER — HYDROCODONE-ACETAMINOPHEN 5-325 MG PO TABS
1.0000 | ORAL_TABLET | Freq: Once | ORAL | Status: DC
  Filled 2016-12-12: qty 1

## 2016-12-12 MED ORDER — DOXYCYCLINE HYCLATE 100 MG PO TABS: 100.0000 mg | ORAL_TABLET | Freq: Once | ORAL | Status: DC

## 2016-12-12 NOTE — ED Provider Notes (Signed)
[facility] DEPT Provider Note   CSN: [id number] Arrival date & time: 12/12/16  2107     History   Chief Complaint Chief Complaint  Patient presents with  . Abdominal Pain  . Emesis    HPI Barry Moore is a 51 y.o. male.  HPI Pt comes in with cc of abd pain and emesis. Pt has had 3 episodes of emesis and he has mucus coming out. Pt has not seen any blood. His nausea is continuous. Pt also has abd pain in the periumbilical region that is constant and started yday. Pain is non radiating. Patient has no pain with urination, blood in the urine, or frequent urination. ROS is also positive for chest pain, which is described as sharp pain, constant and has been present since past few hours. Pt's pain is worse with ambulation and with deep breathing. Pain is non radiating. Pt has hx of gastritis. Pt has no CAD hx.  Pt smoked 1.5 ppd and admits to cocaine use every other day. Pt also drinks 3-6 x 40 oz beers/day.  Past Medical History:  Diagnosis Date  . Alcoholic hepatitis   . Depression   . ETOH abuse   . Hypertension   . Seizures [facility])     Patient Active Problem List   Diagnosis Date Noted  . Cocaine abuse 11/18/2016  . GI bleed 11/01/2016  . Alcohol abuse   . Major depressive disorder, recurrent severe without psychotic features ([place]) 07/06/2016  . Alcohol-induced mood disorder ([place]) 06/15/2016  . Seizure ([place]) 05/26/2016  . Tobacco abuse 05/26/2016  . Homeless 05/26/2016  . Essential hypertension 05/26/2016  . Alcohol dependence with alcohol-induced mood disorder ([place]) 02/14/2016  . Severe alcohol withdrawal without perceptual disturbances without complication ([place]) 15/17/6160  . Alcoholic hepatitis without ascites 02/14/2016  . Thrombocytopenia ([place]) 02/14/2016    Past Surgical History:  Procedure Laterality Date  . ESOPHAGOGASTRODUODENOSCOPY (EGD) WITH PROPOFOL N/A 11/02/2016   Procedure: ESOPHAGOGASTRODUODENOSCOPY (EGD) WITH PROPOFOL;  Surgeon: Carol Ada,  MD;  Location: [facility] ENDOSCOPY;  Service: Endoscopy;  Laterality: N/A;  . HERNIA REPAIR    . LEG SURGERY         Home Medications    Prior to Admission medications   Medication Sig Start Date End Date Taking? Authorizing Provider  citalopram (CELEXA) 10 MG tablet Take 10 mg by mouth daily.    [provider]  ClonazePAM (KLONOPIN PO) Take 1 tablet by mouth every 3 (three) hours as needed.    [provider]    Family History Family History  Problem Relation Age of Onset  . Diabetes Mother   . Alcoholism Mother   . Emphysema Father   . Lung cancer Father   . Alcoholism Father     Social History Social History  Substance Use Topics  . Smoking status: Current Every Day Smoker    Packs/day: 1.00    Types: Cigarettes  . Smokeless tobacco: Never Used  . Alcohol use Yes     Comment: 5th liquor and one Forty daily       Allergies   Aspirin   Review of Systems Review of Systems  Constitutional: Positive for activity change.  Respiratory: Positive for cough.   Cardiovascular: Positive for chest pain.  Gastrointestinal: Positive for abdominal pain, nausea and vomiting.  Genitourinary: Negative for dysuria.  All other systems reviewed and are negative.    Physical Exam Updated Vital Signs BP 121/68   Pulse 87   Temp 98.2 F (36.8 C) (Oral)  Resp 20   SpO2 92%   Physical Exam  Constitutional: He is oriented to person, place, and time. He appears well-developed.  HENT:  Head: Atraumatic.  Neck: Neck supple.  Cardiovascular: Normal rate and intact distal pulses.   Pulmonary/Chest: Effort normal.  Abdominal: Soft. There is tenderness. There is guarding. There is no rebound.  generalized tenderness  Neurological: He is alert and oriented to person, place, and time.  Skin: Skin is warm.  Nursing note and vitals reviewed.    ED Treatments / Results  Labs (all labs ordered are listed, but only abnormal results are displayed) Labs Reviewed    LIPASE, BLOOD - Abnormal; Notable for the following:       Result Value   Lipase 93 (*)    All other components within normal limits  COMPREHENSIVE METABOLIC PANEL - Abnormal; Notable for the following:    Glucose, Bld 103 (*)    BUN 5 (*)    Calcium 8.6 (*)    AST 138 (*)    Alkaline Phosphatase 128 (*)    All other components within normal limits  CBC - Abnormal; Notable for the following:    RBC 3.91 (*)    Hemoglobin 10.5 (*)    HCT 32.7 (*)    RDW 16.4 (*)    All other components within normal limits  TROPONIN I  URINALYSIS, ROUTINE W REFLEX MICROSCOPIC    EKG  EKG Interpretation  Date/Time:  Sunday December 12 2016 22:17:17 EDT Ventricular Rate:  80 PR Interval:    QRS Duration: 96 QT Interval:  382 QTC Calculation: 441 R Axis:   84 Text Interpretation:  Sinus rhythm Anteroseptal infarct, age indeterminate Borderline ST elevation, inferior leads No acute changes Nonspecific ST abnormality  - not new Confirmed by Varney Biles [phone]) on 12/12/2016 11:10:11 PM       Radiology No results found.  Procedures Procedures (including critical care time)  Medications Ordered in ED Medications  HYDROcodone-acetaminophen (NORCO/VICODIN) 5-325 MG per tablet 1 tablet (not administered)  pantoprazole (PROTONIX) injection 40 mg (40 mg Intravenous Given 12/12/16 2203)  gi cocktail (Maalox,Lidocaine,Donnatal) (30 mLs Oral Given 12/12/16 2203)  LORazepam (ATIVAN) injection 1 mg (1 mg Intravenous Given 12/12/16 2203)     Initial Impression / Assessment and Plan / ED Course  I have reviewed the triage vital signs and the nursing notes.  Pertinent labs & imaging results that were available during my care of the patient were reviewed by me and considered in my medical decision making (see chart for details).    Pt comes in with cc of abd pain. Abd pain is generalized. Pt has hx of PUD and pancreatitis hx and he drinks actively. We will get CT non contrast to ensure there  is no free air.  Pt also has chest pain. Admits to substance abuse. Pt has bilateral, equal radial pulse. Pain is not radiating to the back. Pt has cocaine abuse hx, no CAD hx.  Concerns for dissection is low. We will get trops x 2 and aspirin.   Final Clinical Impressions(s) / ED Diagnoses   Final diagnoses:  Acute on chronic pancreatitis (HCC)  PUD (peptic ulcer disease)  Chest pain, unspecified type  Polysubstance abuse    New Prescriptions New Prescriptions   No medications on file     Varney Biles, MD 12/12/16 2328

## 2016-12-12 NOTE — ED Triage Notes (Signed)
[facility] [place] [facility] [place] [facility] with c/o CP and abd pain today with N/V; pt was loaded into back of ambulance and asked paramedic "where are you taking me?" Paramedic responded with "You reported CP so Im taking you to the hospital", pt replied "oh"; EMS reports guarding and tenderness in all quadrants; pt reported drinking 3 32oz beers today; EMS gave no medication en route

## 2016-12-12 NOTE — ED Notes (Signed)
Patient transported to CT 

## 2016-12-13 LAB — TROPONIN I: Troponin I: <0.03 ng/mL (ref <0.03)

## 2016-12-13 LAB — I-STAT TROPONIN, ED: Troponin i, poc: 0.01 ng/mL (ref 0.00–0.08)

## 2016-12-13 MED ORDER — HYDROCODONE-ACETAMINOPHEN 5-325 MG PO TABS: 1.0000 | ORAL_TABLET | Freq: Four times a day (QID) | ORAL | 0 refills | Status: DC | PRN

## 2016-12-13 MED ORDER — OMEPRAZOLE 20 MG PO CPDR: 20.0000 mg | DELAYED_RELEASE_CAPSULE | Freq: Every day | ORAL | 0 refills | Status: DC

## 2016-12-13 MED ORDER — AZITHROMYCIN 250 MG PO TABS: 250.0000 mg | ORAL_TABLET | Freq: Every day | ORAL | 0 refills | Status: DC

## 2016-12-13 MED ORDER — ACETAMINOPHEN 325 MG PO TABS: 650.0000 mg | ORAL_TABLET | Freq: Four times a day (QID) | ORAL | 0 refills | Status: DC | PRN

## 2016-12-13 NOTE — Discharge Instructions (Signed)
All the results in the ER are normal, except for elevated pancreas enzyme and possibly a pneumonia - which is the likely cause of your abdominal pain and chest pain. Cocaine use likely causing chest pain.  The workup in the ER is not complete, and is limited to screening for life threatening and emergent conditions only, so please see a primary care doctor for further evaluation.  Refrain from drug use and alcohol abuse. Take the medicine prescribed.

## 2016-12-13 NOTE — ED Provider Notes (Signed)
Patient with abdominal pain and vomiting signed out to me pending repeat troponin. Repeat troponin is normal. Patient is resting comfortably, and is actually sleeping to the point where I had difficulty waking him up to explain the test results. He is discharged with prescriptions and instructions per Dr. Kathrynn Humble.   Delora Fuel, MD 12/12/28 [phone]

## 2016-12-21 ENCOUNTER — Encounter (HOSPITAL_COMMUNITY): Payer: Self-pay | Admitting: Emergency Medicine

## 2016-12-21 DIAGNOSIS — R1084 Generalized abdominal pain: Secondary | ICD-10-CM | POA: Insufficient documentation

## 2016-12-21 DIAGNOSIS — F17210 Nicotine dependence, cigarettes, uncomplicated: Secondary | ICD-10-CM | POA: Insufficient documentation

## 2016-12-21 DIAGNOSIS — F10920 Alcohol use, unspecified with intoxication, uncomplicated: Secondary | ICD-10-CM | POA: Insufficient documentation

## 2016-12-21 DIAGNOSIS — D649 Anemia, unspecified: Secondary | ICD-10-CM | POA: Insufficient documentation

## 2016-12-21 DIAGNOSIS — D696 Thrombocytopenia, unspecified: Secondary | ICD-10-CM | POA: Insufficient documentation

## 2016-12-21 DIAGNOSIS — R740 Nonspecific elevation of levels of transaminase and lactic acid dehydrogenase [LDH]: Secondary | ICD-10-CM | POA: Insufficient documentation

## 2016-12-21 DIAGNOSIS — I10 Essential (primary) hypertension: Secondary | ICD-10-CM | POA: Insufficient documentation

## 2016-12-21 NOTE — ED Triage Notes (Signed)
Per [facility] patient states he does not feel well from not eating for past 3 days. ETOH on board. Patient also c/o hiccups for past 3 days causing chest wall pain.

## 2016-12-22 ENCOUNTER — Emergency Department (HOSPITAL_COMMUNITY): Payer: Self-pay

## 2016-12-22 ENCOUNTER — Emergency Department (HOSPITAL_COMMUNITY)
Admission: EM | Admit: 2016-12-22 | Discharge: 2016-12-22 | Disposition: A | Discharge status: Home / Self Care | Payer: Self-pay | Attending: Emergency Medicine | Admitting: Emergency Medicine

## 2016-12-22 ENCOUNTER — Encounter (HOSPITAL_COMMUNITY): Payer: Self-pay | Admitting: Radiology

## 2016-12-22 DIAGNOSIS — F10920 Alcohol use, unspecified with intoxication, uncomplicated: Secondary | ICD-10-CM

## 2016-12-22 DIAGNOSIS — R7401 Elevation of levels of liver transaminase levels: Secondary | ICD-10-CM

## 2016-12-22 DIAGNOSIS — D649 Anemia, unspecified: Secondary | ICD-10-CM

## 2016-12-22 DIAGNOSIS — R740 Nonspecific elevation of levels of transaminase and lactic acid dehydrogenase [LDH]: Secondary | ICD-10-CM

## 2016-12-22 DIAGNOSIS — D696 Thrombocytopenia, unspecified: Secondary | ICD-10-CM

## 2016-12-22 DIAGNOSIS — R109 Unspecified abdominal pain: Secondary | ICD-10-CM

## 2016-12-22 LAB — CBC WITH DIFFERENTIAL/PLATELET
Basophils Absolute: 0 10*3/uL (ref 0.0–0.1)
Basophils Relative: 1 %
EOS PCT: 6 %
Eosinophils Absolute: 0.4 10*3/uL (ref 0.0–0.7)
HCT: 30.8 % — ABNORMAL LOW (ref 39.0–52.0)
Hemoglobin: 10 g/dL — ABNORMAL LOW (ref 13.0–17.0)
LYMPHS ABS: 1.4 10*3/uL (ref 0.7–4.0)
LYMPHS PCT: 24 %
MCH: 26.7 pg (ref 26.0–34.0)
MCHC: 32.5 g/dL (ref 30.0–36.0)
MCV: 82.1 fL (ref 78.0–100.0)
MONO ABS: 0.9 10*3/uL (ref 0.1–1.0)
MONOS PCT: 16 %
Neutro Abs: 3.1 10*3/uL (ref 1.7–7.7)
Neutrophils Relative %: 53 %
PLATELETS: 140 10*3/uL — AB (ref 150–400)
RBC: 3.75 MIL/uL — AB (ref 4.22–5.81)
RDW: 17.2 % — ABNORMAL HIGH (ref 11.5–15.5)
WBC: 5.8 10*3/uL (ref 4.0–10.5)

## 2016-12-22 LAB — COMPREHENSIVE METABOLIC PANEL
ALBUMIN: 3.9 g/dL (ref 3.5–5.0)
ALK PHOS: 109 U/L (ref 38–126)
ALT: 59 U/L (ref 17–63)
ANION GAP: 12 (ref 5–15)
AST: 162 U/L — ABNORMAL HIGH (ref 15–41)
BILIRUBIN TOTAL: 0.6 mg/dL (ref 0.3–1.2)
BUN: <5 mg/dL — ABNORMAL LOW (ref 6–20)
CALCIUM: 8.5 mg/dL — AB (ref 8.9–10.3)
CO2: 20 mmol/L — ABNORMAL LOW (ref 22–32)
Chloride: 105 mmol/L (ref 101–111)
Creatinine, Ser: 0.72 mg/dL (ref 0.61–1.24)
Glucose, Bld: 87 mg/dL (ref 65–99)
POTASSIUM: 3.9 mmol/L (ref 3.5–5.1)
Sodium: 137 mmol/L (ref 135–145)
TOTAL PROTEIN: 7.2 g/dL (ref 6.5–8.1)

## 2016-12-22 LAB — ETHANOL: ALCOHOL ETHYL (B): 235 mg/dL — AB (ref <10)

## 2016-12-22 LAB — LIPASE, BLOOD: Lipase: 38 U/L (ref 11–51)

## 2016-12-22 MED ORDER — IOPAMIDOL (ISOVUE-300) INJECTION 61%
INTRAVENOUS | Status: AC
  Administered 2016-12-22: 100 mL via INTRAVENOUS
  Filled 2016-12-22: qty 100

## 2016-12-22 MED ORDER — SODIUM CHLORIDE 0.9 % IV BOLUS (SEPSIS)
1000.0000 mL | Freq: Once | INTRAVENOUS | Status: AC
  Administered 2016-12-22: 1000 mL via INTRAVENOUS

## 2016-12-22 MED ORDER — IOPAMIDOL (ISOVUE-300) INJECTION 61%
100.0000 mL | Freq: Once | INTRAVENOUS | Status: AC | PRN
  Administered 2016-12-22: 100 mL via INTRAVENOUS

## 2016-12-22 NOTE — ED Provider Notes (Signed)
[facility] DEPT Provider Note   CSN: [id number] Arrival date & time: 12/21/16  2217     History   Chief Complaint Chief Complaint  Patient presents with  . Alcohol Intoxication    HPI Barry Moore is a 51 y.o. male.  The history is provided by the patient.  He complains of chest and abdomen and back pain for the last 3 days. Pain is constant and he rates it at 8/10. Nothing makes it better, nothing makes it worse. He states he has not eaten anything in there is 3 days, but has been drinking approximately 80 ounces of beer a day. He denies fever, chills, sweats. There has been no nausea or vomiting but he has had diarrhea.he has not taken anything to treat this.  Past Medical History:  Diagnosis Date  . Alcoholic hepatitis   . Depression   . ETOH abuse   . Hypertension   . Seizures [facility])     Patient Active Problem List   Diagnosis Date Noted  . Cocaine abuse ([place]) 11/18/2016  . GI bleed 11/01/2016  . Alcohol abuse   . Major depressive disorder, recurrent severe without psychotic features ([place]) 07/06/2016  . Alcohol-induced mood disorder ([place]) 06/15/2016  . Seizure ([place]) 05/26/2016  . Tobacco abuse 05/26/2016  . Homeless 05/26/2016  . Essential hypertension 05/26/2016  . Alcohol dependence with alcohol-induced mood disorder ([place]) 02/14/2016  . Severe alcohol withdrawal without perceptual disturbances without complication ([place]) 78/29/5621  . Alcoholic hepatitis without ascites 02/14/2016  . Thrombocytopenia ([place]) 02/14/2016    Past Surgical History:  Procedure Laterality Date  . ESOPHAGOGASTRODUODENOSCOPY (EGD) WITH PROPOFOL N/A 11/02/2016   Procedure: ESOPHAGOGASTRODUODENOSCOPY (EGD) WITH PROPOFOL;  Surgeon: Carol Ada, MD;  Location: [facility];  Service: Endoscopy;  Laterality: N/A;  . HERNIA REPAIR    . LEG SURGERY         Home Medications    Prior to Admission medications   Medication Sig Start Date End Date Taking? Authorizing Provider    acetaminophen (TYLENOL) 325 MG tablet Take 2 tablets (650 mg total) by mouth every 6 (six) hours as needed. 12/13/16   Varney Biles, MD  azithromycin (ZITHROMAX) 250 MG tablet Take 1 tablet (250 mg total) by mouth daily. Take first 2 tablets together, then 1 every day until finished. 12/13/16   Varney Biles, MD  citalopram (CELEXA) 10 MG tablet Take 10 mg by mouth daily.    [provider]  ClonazePAM (KLONOPIN PO) Take 1 tablet by mouth every 3 (three) hours as needed.    [provider]  HYDROcodone-acetaminophen (NORCO/VICODIN) 5-325 MG tablet Take 1 tablet by mouth every 6 (six) hours as needed. 12/13/16   Varney Biles, MD  omeprazole (PRILOSEC) 20 MG capsule Take 1 capsule (20 mg total) by mouth daily. 12/13/16   Varney Biles, MD    Family History Family History  Problem Relation Age of Onset  . Diabetes Mother   . Alcoholism Mother   . Emphysema Father   . Lung cancer Father   . Alcoholism Father     Social History Social History  Substance Use Topics  . Smoking status: Current Every Day Smoker    Packs/day: 1.00    Types: Cigarettes  . Smokeless tobacco: Never Used  . Alcohol use Yes     Comment: 5th liquor and one Forty daily       Allergies   Aspirin   Review of Systems Review of Systems  All other systems reviewed and are negative.  Physical Exam Updated Vital Signs BP 120/78 (BP Location: Right Arm)   Pulse 90   Temp 98.4 F (36.9 C) (Oral)   Resp 18   SpO2 96%   Physical Exam  Nursing note and vitals reviewed.  51 year old male, resting comfortably and in no acute distress. Vital signs are normal. Oxygen saturation is 96%, which is normal. Head is normocephalic and atraumatic. PERRLA, EOMI. Oropharynx is clear. Neck is nontender and supple without adenopathy or JVD. Back is nontender and there is no CVA tenderness. Lungs are clear without rales, wheezes, or rhonchi. Chest is nontender. Heart has regular rate and  rhythm without murmur. Abdomen is soft, flat, with moderate tenderness across the lower abdomen. There is no rebound or guarding. There are no masses or hepatosplenomegaly and peristalsis is hypoactive. Extremities have no cyanosis or edema, full range of motion is present. Skin is warm and dry without rash. Neurologic: Mental status is normal, cranial nerves are intact, there are no motor or sensory deficits.  ED Treatments / Results  Labs (all labs ordered are listed, but only abnormal results are displayed) Labs Reviewed  COMPREHENSIVE METABOLIC PANEL - Abnormal; Notable for the following:       Result Value   CO2 20 (*)    BUN <5 (*)    Calcium 8.5 (*)    AST 162 (*)    All other components within normal limits  ETHANOL - Abnormal; Notable for the following:    Alcohol, Ethyl (B) 235 (*)    All other components within normal limits  CBC WITH DIFFERENTIAL/PLATELET - Abnormal; Notable for the following:    RBC 3.75 (*)    Hemoglobin 10.0 (*)    HCT 30.8 (*)    RDW 17.2 (*)    Platelets 140 (*)    All other components within normal limits  LIPASE, BLOOD  URINALYSIS, ROUTINE W REFLEX MICROSCOPIC    Radiology Ct Abdomen Pelvis W Contrast  Result Date: 12/22/2016 CLINICAL DATA:  Abdominal pain.  Alcohol abuse.  Not eating. EXAM: CT ABDOMEN AND PELVIS WITH CONTRAST TECHNIQUE: Multidetector CT imaging of the abdomen and pelvis was performed using the standard protocol following bolus administration of intravenous contrast. CONTRAST:  100 mL Isovue-300 COMPARISON:  12/12/2016 FINDINGS: Lower chest: Peripheral densities in the lower lungs may represent a combination atelectasis and mild scarring. No pleural effusions. Hepatobiliary: Diffuse low-density of the liver is compatible with steatosis. Portal venous system is patent. No suspicious liver lesions. No biliary dilatation. Normal gallbladder. Pancreas: Normal appearance of the pancreas without inflammation or duct dilatation. Spleen:  Normal appearance of spleen without enlargement. Adrenals/Urinary Tract: Normal adrenal glands. Urinary bladder is markedly distended and extends into the abdomen. 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 No hydronephrosis. No suspicious renal lesions. Stomach/Bowel: Normal appearance of the stomach and duodenum. There is no evidence for bowel dilatation or obstruction. No focal bowel inflammation. Normal appendix. Vascular/Lymphatic: No lymph node enlargement in the abdomen or pelvis. Atherosclerotic calcifications involving the aorta and iliac arteries without aneurysm. Main visceral arteries appear to be patent. Reproductive: Prostate and seminal vesicles are unremarkable. Other: No free fluid.  No free air. Musculoskeletal: Disc space narrowing at L5-S1. No acute abnormality. IMPRESSION: Urinary bladder is markedly distended and could be related to urinary retention. No acute inflammatory changes involving the urinary system. Hepatic steatosis. Electronically Signed   By: Markus Daft M.D.   On: 12/22/2016 08:08    Procedures Procedures (including critical care time)  Medications Ordered in ED Medications  sodium chloride 0.9 % bolus 1,000 mL (0 mLs Intravenous Stopped 12/22/16 0644)  iopamidol (ISOVUE-300) 61 % injection 100 mL (100 mLs Intravenous Contrast Given 12/22/16 0741)     Initial Impression / Assessment and Plan / ED Course  I have reviewed the triage vital signs and the nursing notes.  Pertinent labs & imaging results that were available during my care of the patient were reviewed by me and considered in my medical decision making (see chart for details).  Abdominal pain of uncertain cause.old records are reviewed, and he has history of upper GI bleed and multiple ED visits for alcohol abuse. He has had CT scans of his abdomen which were essentially unremarkable, thalassemia either ED visits with similar pain. Will check screening labs and CT of abdomen and  pelvis.  Laboratory workup shows stable anemia and elevation of AST, mild thrombocytopenia presumably secondary to liver disease and within the range that he has been in before. CT of abdomen and pelvis are unremarkable. Patient is advised of these findings and is discharged.  Final Clinical Impressions(s) / ED Diagnoses   Final diagnoses:  Alcoholic intoxication without complication ([place])  Abdominal pain, unspecified abdominal location  Elevated AST (SGOT)  Normochromic normocytic anemia  Thrombocytopenia (HCC)    New Prescriptions New Prescriptions   No medications on file     Delora Fuel, MD 64/40/34 [phone]

## 2016-12-22 NOTE — Discharge Instructions (Signed)
Stop drinking alcohol! Take ibuprofen as needed for pain.

## 2016-12-28 ENCOUNTER — Encounter (HOSPITAL_COMMUNITY): Payer: Self-pay | Admitting: Nurse Practitioner

## 2016-12-28 ENCOUNTER — Emergency Department (HOSPITAL_COMMUNITY)
Admission: EM | Admit: 2016-12-28 | Discharge: 2016-12-30 | Disposition: A | Discharge status: Home / Self Care | Payer: Self-pay | Attending: Emergency Medicine | Admitting: Emergency Medicine

## 2016-12-28 DIAGNOSIS — Z79899 Other long term (current) drug therapy: Secondary | ICD-10-CM | POA: Insufficient documentation

## 2016-12-28 DIAGNOSIS — Z5900 Homelessness unspecified: Secondary | ICD-10-CM

## 2016-12-28 DIAGNOSIS — F1024 Alcohol dependence with alcohol-induced mood disorder: Secondary | ICD-10-CM

## 2016-12-28 DIAGNOSIS — F10920 Alcohol use, unspecified with intoxication, uncomplicated: Secondary | ICD-10-CM

## 2016-12-28 DIAGNOSIS — F17210 Nicotine dependence, cigarettes, uncomplicated: Secondary | ICD-10-CM | POA: Insufficient documentation

## 2016-12-28 DIAGNOSIS — F10929 Alcohol use, unspecified with intoxication, unspecified: Secondary | ICD-10-CM | POA: Insufficient documentation

## 2016-12-28 LAB — COMPREHENSIVE METABOLIC PANEL
ALT: 55 U/L (ref 17–63)
AST: 128 U/L — AB (ref 15–41)
Albumin: 4.3 g/dL (ref 3.5–5.0)
Alkaline Phosphatase: 120 U/L (ref 38–126)
Anion gap: 11 (ref 5–15)
BILIRUBIN TOTAL: 0.5 mg/dL (ref 0.3–1.2)
BUN: 5 mg/dL — AB (ref 6–20)
CALCIUM: 9.1 mg/dL (ref 8.9–10.3)
CO2: 24 mmol/L (ref 22–32)
CREATININE: 0.79 mg/dL (ref 0.61–1.24)
Chloride: 103 mmol/L (ref 101–111)
Glucose, Bld: 100 mg/dL — ABNORMAL HIGH (ref 65–99)
Potassium: 3.9 mmol/L (ref 3.5–5.1)
Sodium: 138 mmol/L (ref 135–145)
TOTAL PROTEIN: 8.2 g/dL — AB (ref 6.5–8.1)

## 2016-12-28 LAB — CBC
HCT: 34.3 % — ABNORMAL LOW (ref 39.0–52.0)
Hemoglobin: 11.2 g/dL — ABNORMAL LOW (ref 13.0–17.0)
MCH: 26.5 pg (ref 26.0–34.0)
MCHC: 32.7 g/dL (ref 30.0–36.0)
MCV: 81.3 fL (ref 78.0–100.0)
PLATELETS: 187 10*3/uL (ref 150–400)
RBC: 4.22 MIL/uL (ref 4.22–5.81)
RDW: 17.3 % — AB (ref 11.5–15.5)
WBC: 7.3 10*3/uL (ref 4.0–10.5)

## 2016-12-28 LAB — RAPID URINE DRUG SCREEN, HOSP PERFORMED
AMPHETAMINES: NOT DETECTED
BARBITURATES: NOT DETECTED
Benzodiazepines: NOT DETECTED
COCAINE: POSITIVE — AB
Opiates: NOT DETECTED
TETRAHYDROCANNABINOL: NOT DETECTED

## 2016-12-28 LAB — SALICYLATE LEVEL: Salicylate Lvl: <7 mg/dL (ref 2.8–30.0)

## 2016-12-28 LAB — ETHANOL: ALCOHOL ETHYL (B): 386 mg/dL — AB (ref <10)

## 2016-12-28 LAB — ACETAMINOPHEN LEVEL: Acetaminophen (Tylenol), Serum: <10 ug/mL — ABNORMAL LOW (ref 10–30)

## 2016-12-28 MED ORDER — NICOTINE 21 MG/24HR TD PT24
21.0000 mg | MEDICATED_PATCH | Freq: Every day | TRANSDERMAL | Status: DC
  Administered 2016-12-28 – 2016-12-30 (×3): 21 mg via TRANSDERMAL
  Filled 2016-12-28 (×3): qty 1

## 2016-12-28 MED ORDER — LORAZEPAM 1 MG PO TABS: 0.0000 mg | ORAL_TABLET | Freq: Two times a day (BID) | ORAL | Status: DC

## 2016-12-28 MED ORDER — LORAZEPAM 2 MG/ML IJ SOLN: 0.0000 mg | Freq: Two times a day (BID) | INTRAMUSCULAR | Status: DC

## 2016-12-28 MED ORDER — THIAMINE HCL 100 MG/ML IJ SOLN: 100.0000 mg | Freq: Every day | INTRAMUSCULAR | Status: DC

## 2016-12-28 MED ORDER — VITAMIN B-1 100 MG PO TABS
100.0000 mg | ORAL_TABLET | Freq: Every day | ORAL | Status: DC
  Administered 2016-12-28 – 2016-12-30 (×3): 100 mg via ORAL
  Filled 2016-12-28 (×3): qty 1

## 2016-12-28 MED ORDER — ONDANSETRON HCL 4 MG PO TABS
4.0000 mg | ORAL_TABLET | Freq: Three times a day (TID) | ORAL | Status: DC | PRN
  Administered 2016-12-28: 4 mg via ORAL
  Filled 2016-12-28: qty 1

## 2016-12-28 MED ORDER — LORAZEPAM 1 MG PO TABS
0.0000 mg | ORAL_TABLET | Freq: Four times a day (QID) | ORAL | Status: DC
  Administered 2016-12-28: 1 mg via ORAL
  Administered 2016-12-29: 2 mg via ORAL
  Administered 2016-12-29 – 2016-12-30 (×4): 1 mg via ORAL
  Administered 2016-12-30: 2 mg via ORAL
  Filled 2016-12-28 (×2): qty 1
  Filled 2016-12-28: qty 2
  Filled 2016-12-28: qty 1
  Filled 2016-12-28: qty 2
  Filled 2016-12-28 (×2): qty 1

## 2016-12-28 MED ORDER — LORAZEPAM 2 MG/ML IJ SOLN: 0.0000 mg | Freq: Four times a day (QID) | INTRAMUSCULAR | Status: DC

## 2016-12-28 MED ORDER — ZOLPIDEM TARTRATE 5 MG PO TABS: 5.0000 mg | ORAL_TABLET | Freq: Every evening | ORAL | Status: DC | PRN

## 2016-12-28 MED ORDER — ALUM & MAG HYDROXIDE-SIMETH 200-200-20 MG/5ML PO SUSP: 30.0000 mL | Freq: Four times a day (QID) | ORAL | Status: DC | PRN

## 2016-12-28 MED ORDER — ACETAMINOPHEN 325 MG PO TABS
650.0000 mg | ORAL_TABLET | ORAL | Status: DC | PRN
  Administered 2016-12-28: 650 mg via ORAL
  Filled 2016-12-28: qty 2

## 2016-12-28 NOTE — ED Triage Notes (Signed)
Patient is homeless. Patient had a seizure due to ETOH withdrawals and not taking seizure medication which he has not took in a "long time". Patient is also having SI.

## 2016-12-28 NOTE — ED Notes (Signed)
Bed: RZ73 Expected date:  Expected time:  Means of arrival:  Comments: EMS-ETOH/SZ/SI

## 2016-12-28 NOTE — ED Provider Notes (Signed)
[facility] DEPT Provider Note   CSN: [id number] Arrival date & time: 12/28/16  1557     History   Chief Complaint No chief complaint on file.   HPI Barry Moore is a 51 y.o. male.  Patient presents for evaluation of "seizures," and "drinking too much."  He is homeless.  He drinks alcohol daily varying amounts.  He has seizures 2-3 times a week which he feels are unrelated to use of alcohol.  He states he can feel them coming on lies down then "have a seizure."  He is unable to describe them further.  He has not had witnesses to the seizures that he can recall.  He has had frequent ED evaluations.  He is not currently taking any other medications.  He admits to using crack cocaine as well.  He typically smokes it.  He denies use of other illegal substances.  He denies recent illnesses including chest pain, cough, fever, chills, nausea, vomiting or diarrhea.  There are no other known modifying factors.  HPI  Past Medical History:  Diagnosis Date  . Alcoholic hepatitis   . Depression   . ETOH abuse   . Hypertension   . Seizures [facility])     Patient Active Problem List   Diagnosis Date Noted  . Cocaine abuse ([place]) 11/18/2016  . GI bleed 11/01/2016  . Alcohol abuse   . Major depressive disorder, recurrent severe without psychotic features ([place]) 07/06/2016  . 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 Seizure ([place]) 05/26/2016  . Tobacco abuse 05/26/2016  . Homeless 05/26/2016  . Essential hypertension 05/26/2016  . Alcohol dependence with alcohol-induced mood disorder ([place]) 02/14/2016  . Severe alcohol withdrawal without perceptual disturbances without complication ([place]) 82/50/5397  . Alcoholic hepatitis without ascites 02/14/2016  . Thrombocytopenia ([place]) 02/14/2016    Past Surgical History:  Procedure Laterality Date  . ESOPHAGOGASTRODUODENOSCOPY (EGD) WITH PROPOFOL N/A 11/02/2016   Procedure: ESOPHAGOGASTRODUODENOSCOPY (EGD) WITH PROPOFOL;  Surgeon: Carol Ada, MD;  Location: [facility];  Service: Endoscopy;  Laterality: N/A;  . HERNIA REPAIR    . LEG SURGERY         Home Medications    Prior to Admission medications   Medication Sig Start Date End Date Taking? Authorizing Provider  acetaminophen (TYLENOL) 325 MG tablet Take 2 tablets (650 mg total) by mouth every 6 (six) hours as needed. 12/13/16   Varney Biles, MD  azithromycin (ZITHROMAX) 250 MG tablet Take 1 tablet (250 mg total) by mouth daily. Take first 2 tablets together, then 1 every day until finished. 12/13/16   Varney Biles, MD  citalopram (CELEXA) 10 MG tablet Take 10 mg by mouth daily.    [provider]  ClonazePAM (KLONOPIN PO) Take 1 tablet by mouth every 3 (three) hours as needed.    [provider]  HYDROcodone-acetaminophen (NORCO/VICODIN) 5-325 MG tablet Take 1 tablet by mouth every 6 (six) hours as needed. 12/13/16   Varney Biles, MD  omeprazole (PRILOSEC) 20 MG capsule Take 1 capsule (20 mg total) by mouth daily. 12/13/16   Varney Biles, MD    Family History Family History  Problem Relation Age of Onset  . Diabetes Mother   . Alcoholism Mother   . Emphysema Father   . Lung cancer Father   . Alcoholism Father     Social History Social History  Substance Use Topics  . Smoking status: Current Every Day Smoker    Packs/day: 1.00    Types: Cigarettes  . Smokeless tobacco: Never  Used  . Alcohol use Yes     Comment: 5th liquor and one Forty daily       Allergies   Aspirin   Review of Systems Review of Systems  All other systems reviewed and are negative.    Physical Exam Updated Vital Signs BP (!) 142/80   Pulse 100   Temp 98.3 F (36.8 C)   Resp 16   Ht 5\' 8"  (1.727 m)   Wt 77.1 kg (170 lb)   SpO2 98%   BMI 25.85 kg/m   Physical Exam  Constitutional: He is oriented to person, place, and time. He appears well-developed and well-nourished. No distress.  He appears robust  HENT:  Head: Normocephalic and  atraumatic.  Right Ear: External ear normal.  Left Ear: External ear normal.  Eyes: Pupils are equal, round, and reactive to light. Conjunctivae and EOM are normal.  Neck: Normal range of motion and phonation normal. Neck supple.  Cardiovascular: Normal rate.   Pulmonary/Chest: Effort normal. He exhibits no bony tenderness.  Musculoskeletal: Normal range of motion.  Neurological: He is alert and oriented to person, place, and time. No cranial nerve deficit or sensory deficit. He exhibits normal muscle tone. Coordination normal.  No dysarthria, aphasia or nystagmus.  Skin: Skin is warm, dry and intact.  Psychiatric: He has a normal mood and affect. His behavior is normal. Judgment and thought content normal.  Nursing note and vitals reviewed.    ED Treatments / Results  Labs (all labs ordered are listed, but only abnormal results are displayed) Labs Reviewed  RAPID URINE DRUG SCREEN, HOSP PERFORMED - Abnormal; Notable for the following:       Result Value   Cocaine POSITIVE (*)    All other components within normal limits  COMPREHENSIVE METABOLIC PANEL - Abnormal; Notable for the following:    Glucose, Bld 100 (*)    BUN 5 (*)    Total Protein 8.2 (*)    AST 128 (*)    All other components within normal limits  ETHANOL - Abnormal; Notable for the following:    Alcohol, Ethyl (B) 386 (*)    All other components within normal limits  ACETAMINOPHEN LEVEL - Abnormal; Notable for the following:    Acetaminophen (Tylenol), Serum <10 (*)    All other components within normal limits  CBC - Abnormal; Notable for the following:    Hemoglobin 11.2 (*)    HCT 34.3 (*)    RDW 17.3 (*)    All other components within normal limits  SALICYLATE LEVEL    EKG  EKG Interpretation None       Radiology No results found.  Procedures Procedures (including critical care time)  Medications Ordered in ED Medications  LORazepam (ATIVAN) injection 0-4 mg (not administered)    Or    LORazepam (ATIVAN) tablet 0-4 mg (not administered)  LORazepam (ATIVAN) injection 0-4 mg (not administered)    Or  LORazepam (ATIVAN) tablet 0-4 mg (not administered)  thiamine (VITAMIN B-1) tablet 100 mg (not administered)    Or  thiamine (B-1) injection 100 mg (not administered)  acetaminophen (TYLENOL) tablet 650 mg (not administered)  zolpidem (AMBIEN) tablet 5 mg (not administered)  ondansetron (ZOFRAN) tablet 4 mg (not administered)  alum & mag hydroxide-simeth (MAALOX/MYLANTA) 200-200-20 MG/5ML suspension 30 mL (not administered)  nicotine (NICODERM CQ - dosed in mg/24 hours) patch 21 mg (not administered)     Initial Impression / Assessment and Plan / ED Course  I have reviewed  the triage vital signs and the nursing notes.  Pertinent labs & imaging results that were available during my care of the patient were reviewed by me and considered in my medical decision making (see chart for details).  Clinical Course as of Dec 28 1801  Tue Dec 28, 2016  1726 Normal Acetaminophen (Tylenol), S: (!) <10 [EW]  1726 Normal Sodium: 138 [EW]  1726 Normal Potassium: 3.9 [EW]  1727 Normal Creatinine: 0.79 [EW]  1727 Slightly low Hemoglobin: (!) 11.2 [EW]  1727 Normal WBC: 7.3 [EW]  1727 Markedly elevated, in non-intoxicated appearing patient Alcohol, Ethyl (B): (!!) 386 [EW]  1727 Abnormal, substance of abuse COCAINE: (!) POSITIVE [EW]  1801 Patient is resting comfortably, eyes shut but arouses to voice.  He would like to stay here to metabolize alcohol and see if he has a seizure.  Since he is homeless this is an appropriate strategy.  Holding orders, and symptomatic treatment ordered.  We will also give 1 dose of thiamine.  We will plan on observation for 20-24 hours.    [EW]    Clinical Course User Index [EW] Daleen Bo, MD     Patient Vitals for the past 24 hrs:  BP Temp Pulse Resp SpO2 Height Weight  12/28/16 1609 - - - - - 5\' 8"  (1.727 m) 77.1 kg (170 lb)  12/28/16 1607  (!) 142/80 98.3 F (36.8 C) 100 16 98 % - -         Final Clinical Impressions(s) / ED Diagnoses   Final diagnoses:  Alcoholic intoxication without complication ([place])  Homelessness    Homelessness with alcoholism and current alcohol intoxication.  Patient requires observation, and may need treatment for alcohol withdrawal syndrome.  Nursing Notes Reviewed/ Care Coordinated Applicable Imaging Reviewed Interpretation of Laboratory Data incorporated into ED treatment  Plan: As per oncoming provider team, dependent on course of observation.   New Prescriptions New Prescriptions   No medications on file     Daleen Bo, MD 12/28/16 1806

## 2016-12-29 ENCOUNTER — Encounter (HOSPITAL_COMMUNITY): Payer: Self-pay | Admitting: Behavioral Health

## 2016-12-29 MED ORDER — LORAZEPAM 1 MG PO TABS
2.0000 mg | ORAL_TABLET | Freq: Once | ORAL | Status: AC
  Administered 2016-12-29: 2 mg via ORAL
  Filled 2016-12-29: qty 2

## 2016-12-29 MED ORDER — TRAZODONE HCL 50 MG PO TABS
50.0000 mg | ORAL_TABLET | Freq: Every day | ORAL | Status: DC
  Administered 2016-12-29: 50 mg via ORAL
  Filled 2016-12-29: qty 1

## 2016-12-29 MED ORDER — HYDROXYZINE HCL 25 MG PO TABS
50.0000 mg | ORAL_TABLET | Freq: Three times a day (TID) | ORAL | Status: DC
  Administered 2016-12-29 – 2016-12-30 (×3): 50 mg via ORAL
  Filled 2016-12-29 (×3): qty 2

## 2016-12-29 MED ORDER — GABAPENTIN 300 MG PO CAPS
300.0000 mg | ORAL_CAPSULE | Freq: Three times a day (TID) | ORAL | Status: DC
Start: 2016-12-29 — End: 2016-12-30
  Administered 2016-12-29 – 2016-12-30 (×3): 300 mg via ORAL
  Filled 2016-12-29 (×3): qty 1

## 2016-12-29 MED ORDER — GABAPENTIN 300 MG PO CAPS: 300.0000 mg | ORAL_CAPSULE | Freq: Three times a day (TID) | ORAL | Status: DC

## 2016-12-29 MED ORDER — HYDROXYZINE HCL 25 MG PO TABS: 50.0000 mg | ORAL_TABLET | Freq: Three times a day (TID) | ORAL | Status: DC

## 2016-12-29 NOTE — Progress Notes (Addendum)
12/29/16 1425:  LRT introduced self to pt and offered activities.  Pt declined.  Victorino Sparrow, LRT/CTRS

## 2016-12-29 NOTE — ED Notes (Signed)
;  peer support into see

## 2016-12-29 NOTE — ED Notes (Signed)
TTS into see 

## 2016-12-29 NOTE — ED Notes (Signed)
Patient denies SI/HI/VH at this time. Patient reports increase depression and AH. Plan of care discussed. Encouragement and support provided and safety maintain. Q 15 min safety checks remain in place and video monitoring.
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
## 2016-12-29 NOTE — Patient Outreach (Signed)
ED Peer Support Specialist Patient Intake (Complete at intake & 30-60 Day Follow-up)  Name: Barry Moore  MRN: [id number]  Age: 51 y.o.   Date of Admission: 12/29/2016  Intake: 30 Day Comments:      Primary Reason Admitted: Patient is homeless. Patient had a seizure due to ETOH withdrawals and not taking seizure medication which he has not took in a "long time". Patient is also having SI.   Lab values: Alcohol/ETOH: Positive Positive UDS? No Amphetamines: No Barbiturates: No Benzodiazepines: No Cocaine: Yes Opiates: No Cannabinoids: No  Demographic information: Gender: Male Ethnicity:   Marital Status: Single Insurance Status: Uninsured/Self-pay Ecologist (Work Neurosurgeon, Physicist, medical, etc.: Yes (Food stamps) Lives with: Alone Living situation: Homeless  Reported Patient History: Patient reported health conditions: Depression Patient aware of HIV and hepatitis status: No  In past year, has patient visited ED for any reason? Yes  Number of ED visits: 5  Reason(s) for visit: withdrawl seizures  In past year, has patient been hospitalized for any reason? Yes  Number of hospitalizations:    Reason(s) for hospitalization:    In past year, has patient been arrested? No  Number of arrests:    Reason(s) for arrest:    In past year, has patient been incarcerated? No  Number of incarcerations:    Reason(s) for incarceration:    In past year, has patient received medication-assisted treatment? No  In past year, patient received the following treatments:    In past year, has patient received any harm reduction services? No  Did this include any of the following?    In past year, has patient received care from a mental health provider for diagnosis other than SUD? No  In past year, is this first time patient has overdosed? No  Number of past overdoses: 0  In past year, is this first time patient has been hospitalized for an  overdose? No  Number of hospitalizations for overdose(s):    Is patient currently receiving treatment for a mental health diagnosis? No  Patient reports experiencing difficulty participating in SUD treatment: No    Most important reason(s) for this difficulty?    Has patient received prior services for treatment? No  In past, patient has received services from following agencies:    Plan of Care:  Suggested follow up at these agencies/treatment centers: Other (comment) (detox [facility])  Other information: CPSS Aaron Edelman and John processed with Pt to see what plan of care Pt wanted to work towards after being discharged. CPSS John addressed the fact that Pt could benefit from detox services, and gave Pt information on several facilities.   Aaron Edelman Sampson Self, CPSS  12/29/2016 11:30 AM

## 2016-12-29 NOTE — ED Notes (Signed)
Gatorade given and patient encouraged to drink.

## 2016-12-29 NOTE — ED Notes (Signed)
Dr Tomi Bamberger updated and will see

## 2016-12-29 NOTE — ED Notes (Signed)
community support person into see

## 2016-12-30 DIAGNOSIS — F1024 Alcohol dependence with alcohol-induced mood disorder: Secondary | ICD-10-CM

## 2016-12-30 DIAGNOSIS — Z590 Homelessness: Secondary | ICD-10-CM

## 2016-12-30 DIAGNOSIS — R450 Nervousness: Secondary | ICD-10-CM

## 2016-12-30 DIAGNOSIS — Z811 Family history of alcohol abuse and dependence: Secondary | ICD-10-CM

## 2016-12-30 DIAGNOSIS — F17210 Nicotine dependence, cigarettes, uncomplicated: Secondary | ICD-10-CM

## 2016-12-30 DIAGNOSIS — F419 Anxiety disorder, unspecified: Secondary | ICD-10-CM

## 2016-12-30 DIAGNOSIS — F1410 Cocaine abuse, uncomplicated: Secondary | ICD-10-CM

## 2016-12-30 DIAGNOSIS — R4587 Impulsiveness: Secondary | ICD-10-CM

## 2016-12-30 DIAGNOSIS — R569 Unspecified convulsions: Secondary | ICD-10-CM

## 2016-12-30 MED ORDER — HYDROXYZINE HCL 50 MG PO TABS: 50.0000 mg | ORAL_TABLET | Freq: Three times a day (TID) | ORAL | 0 refills | Status: DC

## 2016-12-30 MED ORDER — TRAZODONE HCL 50 MG PO TABS: 50.0000 mg | ORAL_TABLET | Freq: Every day | ORAL | 0 refills | Status: DC

## 2016-12-30 MED ORDER — GABAPENTIN 300 MG PO CAPS: 300.0000 mg | ORAL_CAPSULE | Freq: Three times a day (TID) | ORAL | 0 refills | Status: DC

## 2016-12-30 NOTE — ED Notes (Addendum)
Discharge Note:  Patient AVS and prescriptions given to and gone over with patient and peer support from community (consent to release information on chart), all questions/concerns addressed. Patient denies SI/HI/AVH at this time. Patient discharged home per MD orders. Personal property returned and patient signed e-signature. Patient ambulatory off the unit.

## 2016-12-30 NOTE — Discharge Instructions (Signed)
Follow up for outpatient substance abuse treatment:  [facility] [place], [place] [address] Office: [phone]   Fax: [phone]  [facility]

## 2016-12-30 NOTE — ED Notes (Signed)
Social worker at bedside.

## 2016-12-30 NOTE — Consult Note (Signed)
[facility] Face-to-Face Psychiatry Consult   Reason for Consult: Alcohol intoxication Referring Physician:  EDP Patient Identification: Barry Moore MRN:  [id number] Principal Diagnosis: Alcohol dependence with alcohol-induced mood disorder [facility]) Diagnosis:   Patient Active Problem List   Diagnosis Date Noted  . Cocaine abuse ([place]) [F14.10] 11/18/2016  . GI bleed [K92.2] 11/01/2016  . Alcohol abuse [F10.10]   . 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 Alcohol-induced mood disorder ([place]) [F10.94] 06/15/2016  . Seizure ([place]) [R56.9] 05/26/2016  . Tobacco abuse [Z72.0] 05/26/2016  . Homeless [Z59.0] 05/26/2016  . Essential hypertension [I10] 05/26/2016  . Alcohol dependence with alcohol-induced mood disorder ([place]) [F10.24] 02/14/2016  . Severe alcohol withdrawal without perceptual disturbances without complication ([place]) [F62.130] 02/14/2016  . Alcoholic hepatitis without ascites [K70.10] 02/14/2016  . Thrombocytopenia ([place]) [D69.6] 02/14/2016    Total Time spent with patient: 45 minutes  Subjective:   Barry Moore is a 51 y.o. male patient admitted with alcohol intoxication.  HPI:  Pt was seen and chart reviewed with treatment team and Dr Darleene Cleaver. Pt has along history of alcoholism and drug abuse. Pt has frequented local emergency rooms 18 times in the past 6 months and has had two inpatient admissions. Pt refuses to follow up with outpatient resources which are continually provided to him. Pt's BAL 386 and UDS positive for cocaine on admission. Pt is homeless and states he has a seizure disorder. No seizures witnessed while in the [facility]. Pt was seen by Peer Support Specialist for assistance with finding community resources available for substance abuse treatment and support.  Pt denies suicidal/homicidal ideation, denies auditory/visual hallucinations and does not appear to be responding to internal stimuli. Pt is psychiatrically clear  for discharge.   Past Psychiatric History: As above  Risk to Self: None Risk to Others: None Prior Inpatient Therapy: Prior Inpatient Therapy: Yes Prior Therapy Dates: 2018,2017 Prior Therapy Facilty/Provider(s): [facility] and [facility] Reason for Treatment: SI, SA issues Prior Outpatient Therapy: Prior Outpatient Therapy: No Does patient have an ACCT team?: No Does patient have Intensive In-House Services?  : No Does patient have [facility] services? : No Does patient have P4CC services?: No  Past Medical History:  Past Medical History:  Diagnosis Date  . Alcoholic hepatitis   . Depression   . ETOH abuse   . Hypertension   . Seizures ([place])     Past Surgical History:  Procedure Laterality Date  . ESOPHAGOGASTRODUODENOSCOPY (EGD) WITH PROPOFOL N/A 11/02/2016   Procedure: ESOPHAGOGASTRODUODENOSCOPY (EGD) WITH PROPOFOL;  Surgeon: Carol Ada, MD;  Location: [facility];  Service: Endoscopy;  Laterality: N/A;  . HERNIA REPAIR    . LEG SURGERY     Family History:  Family History  Problem Relation Age of Onset  . Diabetes Mother   . Alcoholism Mother   . Emphysema Father   . Lung cancer Father   . Alcoholism Father    Family Psychiatric  History: Unknown Social History:  History  Alcohol Use  . Yes    Comment: BAC on admission was 386     History  Drug Use  . Frequency: 3.0 times per week  . Types: Cocaine    Comment: uds + Coc    Social History   Social History  . Marital status: Divorced    Spouse name: N/A  . Number of children: N/A  . Years of education: N/A   Social History Main Topics  . Smoking status: Current Every Day Smoker  Packs/day: 1.00    Types: Cigarettes  . Smokeless tobacco: Never Used  . Alcohol use Yes     Comment: BAC on admission was 386  . Drug use: Yes    Frequency: 3.0 times per week    Types: Cocaine     Comment: uds + Coc  . Sexual activity: Not Currently   Other Topics Concern  . None   Social History Narrative  . None    Additional Social History:    Allergies:   Allergies  Allergen Reactions  . Aspirin Nausea And Vomiting    Labs:  Results for orders placed or performed during the hospital encounter of 12/28/16 (from the past 48 hour(s))  Rapid urine drug screen (hospital performed)     Status: Abnormal   Collection Time: 12/28/16  4:24 PM  Result Value Ref Range   Opiates NONE DETECTED NONE DETECTED   Cocaine POSITIVE (A) NONE DETECTED   Benzodiazepines NONE DETECTED NONE DETECTED   Amphetamines NONE DETECTED NONE DETECTED   Tetrahydrocannabinol NONE DETECTED NONE DETECTED   Barbiturates NONE DETECTED NONE DETECTED    Comment:        DRUG SCREEN FOR MEDICAL PURPOSES ONLY.  IF CONFIRMATION IS NEEDED FOR ANY PURPOSE, NOTIFY LAB WITHIN 5 DAYS.        LOWEST DETECTABLE LIMITS FOR URINE DRUG SCREEN Drug Class       Cutoff (ng/mL) Amphetamine      1000 Barbiturate      200 Benzodiazepine   415 Tricyclics       830 Opiates          300 Cocaine          300 THC              50   Comprehensive metabolic panel     Status: Abnormal   Collection Time: 12/28/16  4:26 PM  Result Value Ref Range   Sodium 138 135 - 145 mmol/L   Potassium 3.9 3.5 - 5.1 mmol/L   Chloride 103 101 - 111 mmol/L   CO2 24 22 - 32 mmol/L   Glucose, Bld 100 (H) 65 - 99 mg/dL   BUN 5 (L) 6 - 20 mg/dL   Creatinine, Ser 0.79 0.61 - 1.24 mg/dL   Calcium 9.1 8.9 - 10.3 mg/dL   Total Protein 8.2 (H) 6.5 - 8.1 g/dL   Albumin 4.3 3.5 - 5.0 g/dL   AST 128 (H) 15 - 41 U/L   ALT 55 17 - 63 U/L   Alkaline Phosphatase 120 38 - 126 U/L   Total Bilirubin 0.5 0.3 - 1.2 mg/dL   GFR calc non Af Amer >60 >60 mL/min   GFR calc Af Amer >60 >60 mL/min    Comment: (NOTE) The eGFR has been calculated using the CKD EPI equation. This calculation has not been validated in all clinical situations. eGFR's persistently <60 mL/min signify possible Chronic Kidney Disease.    Anion gap 11 5 - 15  Ethanol     Status: Abnormal    Collection Time: 12/28/16  4:26 PM  Result Value Ref Range   Alcohol, Ethyl (B) 386 (HH) <10 mg/dL    Comment:        LOWEST DETECTABLE LIMIT FOR SERUM ALCOHOL IS 10 mg/dL FOR MEDICAL PURPOSES ONLY Please note change in reference range. CRITICAL RESULT CALLED TO, READ BACK BY AND VERIFIED WITH: R.LOCKAMY AT 1722 ON 12/28/16 BY N.THOMPSON   Salicylate level     Status: None  Collection Time: 12/28/16  4:26 PM  Result Value Ref Range   Salicylate Lvl <6.2 2.8 - 30.0 mg/dL  Acetaminophen level     Status: Abnormal   Collection Time: 12/28/16  4:26 PM  Result Value Ref Range   Acetaminophen (Tylenol), Serum <10 (L) 10 - 30 ug/mL    Comment:        THERAPEUTIC CONCENTRATIONS VARY SIGNIFICANTLY. A RANGE OF 10-30 ug/mL MAY BE AN EFFECTIVE CONCENTRATION FOR MANY PATIENTS. HOWEVER, SOME ARE BEST TREATED AT CONCENTRATIONS OUTSIDE THIS RANGE. ACETAMINOPHEN CONCENTRATIONS >150 ug/mL AT 4 HOURS AFTER INGESTION AND >50 ug/mL AT 12 HOURS AFTER INGESTION ARE OFTEN ASSOCIATED WITH TOXIC REACTIONS.   cbc     Status: Abnormal   Collection Time: 12/28/16  4:26 PM  Result Value Ref Range   WBC 7.3 4.0 - 10.5 K/uL   RBC 4.22 4.22 - 5.81 MIL/uL   Hemoglobin 11.2 (L) 13.0 - 17.0 g/dL   HCT 34.3 (L) 39.0 - 52.0 %   MCV 81.3 78.0 - 100.0 fL   MCH 26.5 26.0 - 34.0 pg   MCHC 32.7 30.0 - 36.0 g/dL   RDW 17.3 (H) 11.5 - 15.5 %   Platelets 187 150 - 400 K/uL    Current Facility-Administered Medications  Medication Dose Route Frequency Provider Last Rate Last Dose  . acetaminophen (TYLENOL) tablet 650 mg  650 mg Oral Q4H PRN Daleen Bo, MD   650 mg at 12/28/16 2245  . alum & mag hydroxide-simeth (MAALOX/MYLANTA) 200-200-20 MG/5ML suspension 30 mL  30 mL Oral Q6H PRN Daleen Bo, MD      . gabapentin (NEURONTIN) capsule 300 mg  300 mg Oral TID Ethelene Hal, NP   300 mg at 12/30/16 0959  . hydrOXYzine (ATARAX/VISTARIL) tablet 50 mg  50 mg Oral TID Ethelene Hal, NP   50 mg  at 12/30/16 0959  . LORazepam (ATIVAN) injection 0-4 mg  0-4 mg Intravenous Q6H Daleen Bo, MD       Or  . LORazepam (ATIVAN) tablet 0-4 mg  0-4 mg Oral Q6H Daleen Bo, MD   1 mg at 12/30/16 0604  . [START ON 12/31/2016] LORazepam (ATIVAN) injection 0-4 mg  0-4 mg Intravenous Q12H Daleen Bo, MD       Or  . Derrill Memo ON 12/31/2016] LORazepam (ATIVAN) tablet 0-4 mg  0-4 mg Oral Q12H Daleen Bo, MD      . nicotine (NICODERM CQ - dosed in mg/24 hours) patch 21 mg  21 mg Transdermal Daily Daleen Bo, MD   21 mg at 12/30/16 1000  . ondansetron (ZOFRAN) tablet 4 mg  4 mg Oral Q8H PRN Daleen Bo, MD   4 mg at 12/28/16 2237  . thiamine (VITAMIN B-1) tablet 100 mg  100 mg Oral Daily Daleen Bo, MD   100 mg at 12/30/16 1000   Or  . thiamine (B-1) injection 100 mg  100 mg Intravenous Daily Daleen Bo, MD      . traZODone (DESYREL) tablet 50 mg  50 mg Oral QHS Ethelene Hal, NP   50 mg at 12/29/16 2152   Current Outpatient Prescriptions  Medication Sig Dispense Refill  . acetaminophen (TYLENOL) 325 MG tablet Take 2 tablets (650 mg total) by mouth every 6 (six) hours as needed. 30 tablet 0  . azithromycin (ZITHROMAX) 250 MG tablet Take 1 tablet (250 mg total) by mouth daily. Take first 2 tablets together, then 1 every day until finished. 6 tablet 0  . citalopram (CELEXA)  10 MG tablet Take 10 mg by mouth daily.    . ClonazePAM (KLONOPIN PO) Take 1 tablet by mouth every 3 (three) hours as needed.    [name] HYDROcodone-acetaminophen (NORCO/VICODIN) 5-325 MG tablet Take 1 tablet by mouth every 6 (six) hours as needed. 6 tablet 0  . omeprazole (PRILOSEC) 20 MG capsule Take 1 capsule (20 mg total) by mouth daily. 60 capsule 0    Musculoskeletal: Strength & Muscle Tone: within normal limits Gait & Station: normal Patient leans: N/A  Psychiatric Specialty Exam: Physical Exam  Constitutional: He is oriented to person, place, and time. He appears well-developed and  well-nourished.  HENT:  Head: Normocephalic.  Respiratory: Effort normal.  Musculoskeletal: Normal range of motion.  Neurological: He is alert and oriented to person, place, and time.  Psychiatric: His speech is normal and behavior is normal. Thought content normal. His mood appears anxious. Cognition and memory are impaired (ETOH and Cocaine abuse). He expresses impulsivity. He exhibits a depressed mood.    Review of Systems  Psychiatric/Behavioral: Positive for depression and substance abuse. Negative for hallucinations, memory loss and suicidal ideas. The patient is nervous/anxious. The patient does not have insomnia.   All other systems reviewed and are negative.   Blood pressure (!) 149/89, pulse 89, temperature 98.6 F (37 C), temperature source Oral, resp. rate 16, height 5' 8"  (1.727 m), weight 77.1 kg (170 lb), SpO2 100 %.Body mass index is 25.85 kg/m.  General Appearance: Disheveled  Eye Contact:  Fair  Speech:  Clear and Coherent and Normal Rate  Volume:  Normal  Mood:  Anxious and Depressed  Affect:  Congruent and Depressed  Thought Process:  Coherent and Linear  Orientation:  Full (Time, Place, and Person)  Thought Content:  Logical  Suicidal Thoughts:  No  Homicidal Thoughts:  No  Memory:  Immediate;   Good Recent;   Fair Remote;   Fair  Judgement:  Fair  Insight:  Lacking  Psychomotor Activity:  Normal  Concentration:  Concentration: Good and Attention Span: Good  Recall:  Good  Fund of Knowledge:  Good  Language:  Good  Akathisia:  No  Handed:  Right  AIMS (if indicated):     Assets:  Agricultural consultant Resilience Social Support  ADL's:  Intact  Cognition:  WNL  Sleep:        Treatment Plan Summary: Plan Alcohol dependence with alcohol induced mood disorder  Follow up with Alcohol and Drug services for substance abuse treatment Stay in contact with Peer Support Specialist in the community (business card was  provided) Avoid the use of alcohol and illicit drugs Take all medications as prescribed  Disposition: No evidence of imminent risk to self or others at present.   Patient does not meet criteria for psychiatric inpatient admission. Supportive therapy provided about ongoing stressors. Discussed crisis plan, support from social network, calling 911, coming to the Emergency Department, and calling Suicide Hotline.  Ethelene Hal, NP 12/30/2016 10:30 AM  Patient seen face-to-face for psychiatric evaluation, chart reviewed and case discussed with the physician extender and developed treatment plan. Reviewed the information documented and agree with the treatment plan. Corena Pilgrim, MD

## 2016-12-30 NOTE — [facility] Suicide Risk Assessment (Signed)
Suicide Risk Assessment  Discharge Assessment   [facility] Discharge Suicide Risk Assessment   Principal Problem: Alcohol dependence with alcohol-induced mood disorder [facility]) Discharge Diagnoses:  Patient Active Problem List   Diagnosis Date Noted  . Cocaine abuse ([place]) [F14.10] 11/18/2016  . GI bleed [K92.2] 11/01/2016  . Alcohol abuse [F10.10]   . Major depressive disorder, recurrent severe without psychotic features ([place]) [F33.2] 07/06/2016  . Alcohol-induced mood disorder ([place]) [F10.94] 06/15/2016  . Seizure ([place]) [R56.9] 05/26/2016  . Tobacco abuse [Z72.0] 05/26/2016  . Homeless [Z59.0] 05/26/2016  . Essential hypertension [I10] 05/26/2016  . Alcohol dependence with alcohol-induced mood disorder ([place]) [F10.24] 02/14/2016  . Severe alcohol withdrawal without perceptual disturbances without complication ([place]) [Q03.474] 02/14/2016  . Alcoholic hepatitis without ascites [K70.10] 02/14/2016  . Thrombocytopenia ([place]) [D69.6] 02/14/2016    Total Time spent with patient: 45 minutes  Musculoskeletal: Strength & Muscle Tone: within normal limits Gait & Station: normal Patient leans: N/A  Psychiatric Specialty Exam: Physical Exam  Constitutional: He is oriented to person, place, and time. He appears well-developed and well-nourished.  HENT:  Head: Normocephalic.  Respiratory: Effort normal.  Musculoskeletal: Normal range of motion.  Neurological: He is alert and oriented to person, place, and time.  Psychiatric: His speech is normal and behavior is normal. Thought content normal. His mood appears anxious. Cognition and memory are impaired (ETOH and Cocaine abuse). He expresses impulsivity. He exhibits a depressed mood.   Review of Systems  Psychiatric/Behavioral: Positive for depression and substance abuse. Negative for hallucinations, memory loss and suicidal ideas. The patient is nervous/anxious. The patient does not have insomnia.   All other systems reviewed and are  negative.  Blood pressure (!) 149/89, pulse 89, temperature 98.6 F (37 C), temperature source Oral, resp. rate 16, height 5\' 8"  (1.727 m), weight 77.1 kg (170 lb), SpO2 100 %.Body mass index is 25.85 kg/m. General Appearance: Disheveled Eye Contact:  Fair Speech:  Clear and Coherent and Normal Rate Volume:  Normal Mood:  Anxious and Depressed Affect:  Congruent and Depressed Thought Process:  Coherent and Linear Orientation:  Full (Time, Place, and Person) Thought Content:  Logical Suicidal Thoughts:  No Homicidal Thoughts:  No Memory:  Immediate;   Good Recent;   Fair Remote;   Fair Judgement:  Fair Insight:  Lacking Psychomotor Activity:  Normal Concentration:  Concentration: Good and Attention Span: Good Recall:  Good Fund of Knowledge:  Good Language:  Good Akathisia:  No Handed:  Right AIMS (if indicated):    Assets:  Agricultural consultant Resilience Social Support ADL's:  Intact Cognition:  WNL   Mental Status Per Nursing Assessment::   On Admission:   Intoxicated  Demographic Factors:  Male, Caucasian, Low socioeconomic status, Living alone and Unemployed  Loss Factors: Decline in physical health and Financial problems/change in socioeconomic status  Historical Factors: Impulsivity  Risk Reduction Factors:   Sense of responsibility to family  Continued Clinical Symptoms:  Depression:   Comorbid alcohol abuse/dependence Impulsivity Alcohol/Substance Abuse/Dependencies More than one psychiatric diagnosis Previous Psychiatric Diagnoses and Treatments Medical Diagnoses and Treatments/Surgeries  Cognitive Features That Contribute To Risk:  Closed-mindedness    Suicide Risk:  Minimal: No identifiable suicidal ideation.  Patients presenting with no risk factors but with morbid ruminations; may be classified as minimal risk based on the severity of the depressive symptoms    Plan Of Care/Follow-up recommendations:   Activity:  as tolerated Diet:  Heart Healthy  Ethelene Hal, NP 12/30/2016, 11:37 AM

## 2017-01-06 ENCOUNTER — Emergency Department (HOSPITAL_COMMUNITY): Admission: EM | Admit: 2017-01-06 | Discharge: 2017-01-06 | Discharge status: Against Medical Advice | Payer: Self-pay

## 2017-01-06 NOTE — ED Notes (Signed)
Called pt, no response. 

## 2017-01-18 ENCOUNTER — Emergency Department (HOSPITAL_COMMUNITY)
Admission: EM | Admit: 2017-01-18 | Discharge: 2017-01-19 | Disposition: A | Discharge status: Home / Self Care | Payer: Self-pay | Attending: Emergency Medicine | Admitting: Emergency Medicine

## 2017-01-18 DIAGNOSIS — Z79899 Other long term (current) drug therapy: Secondary | ICD-10-CM | POA: Insufficient documentation

## 2017-01-18 DIAGNOSIS — F10929 Alcohol use, unspecified with intoxication, unspecified: Secondary | ICD-10-CM | POA: Insufficient documentation

## 2017-01-18 DIAGNOSIS — F10920 Alcohol use, unspecified with intoxication, uncomplicated: Secondary | ICD-10-CM

## 2017-01-18 DIAGNOSIS — F17210 Nicotine dependence, cigarettes, uncomplicated: Secondary | ICD-10-CM | POA: Insufficient documentation

## 2017-01-18 NOTE — ED Notes (Signed)
Bed: [facility] Expected date:  Expected time:  Means of arrival:  Comments: 

## 2017-01-18 NOTE — ED Notes (Signed)
Patient asleep in the lobby. Equal chest rise and fall noted.

## 2017-01-19 ENCOUNTER — Encounter (HOSPITAL_COMMUNITY): Payer: Self-pay

## 2017-01-19 MED ORDER — ONDANSETRON 4 MG PO TBDP
4.0000 mg | ORAL_TABLET | Freq: Once | ORAL | Status: AC
  Administered 2017-01-19: 4 mg via ORAL
  Filled 2017-01-19: qty 1

## 2017-01-19 NOTE — ED Triage Notes (Signed)
Pt in from EMS intoxicated, pt sleeping in lobby

## 2017-01-19 NOTE — ED Notes (Signed)
Pt tolerated fluid/PO challenge well---- has had no nausea or vomiting.

## 2017-01-19 NOTE — ED Provider Notes (Signed)
[place] DEPT Provider Note   CSN: [id number] Arrival date & time: 01/18/17  2026     History   Chief Complaint Chief Complaint  Patient presents with  . Alcohol Intoxication    HPI Barry Moore is a 51 y.o. male.  HPI  This is a 51 year old male with a history of alcohol abuse, hypertension, seizures who presents with intoxication.  Patient has been sleeping in the lobby for approximately 8 hours.  On my evaluation he states that he came because he feels nauseous and jittery.  He drank several 40s of alcohol yesterday.  He drinks daily.  Reports one episode of nonbloody emesis.  No abdominal pain, shortness of breath, chest pain.  Patient states that he feel "sick."  Denies any fevers.  Patient is homeless.  Past Medical History:  Diagnosis Date  . Alcoholic hepatitis   . Depression   . ETOH abuse   . Hypertension   . Seizures [facility])     Patient Active Problem List   Diagnosis Date Noted  . Cocaine abuse ([place]) 11/18/2016  . GI bleed 11/01/2016  . Alcohol abuse   . Major depressive disorder, recurrent severe without psychotic features ([place]) 07/06/2016  . Alcohol-induced mood disorder ([place]) 06/15/2016  . Seizure ([place]) 05/26/2016  . Tobacco abuse 05/26/2016  . Homeless 05/26/2016  . Essential hypertension 05/26/2016  . Alcohol dependence with alcohol-induced mood disorder ([place]) 02/14/2016  . Severe alcohol withdrawal without perceptual disturbances without complication ([place]) 79/89/2119  . Alcoholic hepatitis without ascites 02/14/2016  . Thrombocytopenia ([place]) 02/14/2016    Past Surgical History:  Procedure Laterality Date  . ESOPHAGOGASTRODUODENOSCOPY (EGD) WITH PROPOFOL N/A 11/02/2016   Procedure: ESOPHAGOGASTRODUODENOSCOPY (EGD) WITH PROPOFOL;  Surgeon: Carol Ada, MD;  Location: [facility];  Service: Endoscopy;  Laterality: N/A;  . HERNIA REPAIR    . LEG SURGERY         Home Medications    Prior to Admission  medications   Medication Sig Start Date End Date Taking? Authorizing Provider  acetaminophen (TYLENOL) 325 MG tablet Take 2 tablets (650 mg total) by mouth every 6 (six) hours as needed. 12/13/16   Varney Biles, MD  azithromycin (ZITHROMAX) 250 MG tablet Take 1 tablet (250 mg total) by mouth daily. Take first 2 tablets together, then 1 every day until finished. 12/13/16   Varney Biles, MD  citalopram (CELEXA) 10 MG tablet Take 10 mg by mouth daily.    [provider]  ClonazePAM (KLONOPIN PO) Take 1 tablet by mouth every 3 (three) hours as needed.    [provider]  gabapentin (NEURONTIN) 300 MG capsule Take 1 capsule (300 mg total) by mouth 3 (three) times daily. 12/30/16   Ethelene Hal, NP  HYDROcodone-acetaminophen (NORCO/VICODIN) 5-325 MG tablet Take 1 tablet by mouth every 6 (six) hours as needed. 12/13/16   Varney Biles, MD  hydrOXYzine (ATARAX/VISTARIL) 50 MG tablet Take 1 tablet (50 mg total) by mouth 3 (three) times daily. 12/30/16   Ethelene Hal, NP  omeprazole (PRILOSEC) 20 MG capsule Take 1 capsule (20 mg total) by mouth daily. 12/13/16   Varney Biles, MD  traZODone (DESYREL) 50 MG tablet Take 1 tablet (50 mg total) by mouth at bedtime. 12/30/16   Ethelene Hal, NP    Family History Family History  Problem Relation Age of Onset  . Diabetes Mother   . Alcoholism Mother   . Emphysema Father   . Lung cancer Father   . Alcoholism Father  Social History Social History  Substance Use Topics  . Smoking status: Current Every Day Smoker    Packs/day: 1.00    Types: Cigarettes  . Smokeless tobacco: Never Used  . Alcohol use Yes     Comment: BAC on admission was 386     Allergies   Aspirin   Review of Systems Review of Systems  Constitutional: Negative for fever.  Respiratory: Negative for shortness of breath.   Cardiovascular: Negative for chest pain.  Gastrointestinal: Positive for nausea and vomiting. Negative for  abdominal pain and diarrhea.  Neurological: Positive for tremors.  Psychiatric/Behavioral: Negative for suicidal ideas. The patient is not nervous/anxious.   All other systems reviewed and are negative.    Physical Exam Updated Vital Signs BP (!) 155/82 (BP Location: Left Arm)   Pulse 100   Resp 17   SpO2 97%   Physical Exam  Constitutional: He is oriented to person, place, and time. He appears well-developed and well-nourished.  Disheveled appearing, dirty, no acute distress  HENT:  Head: Normocephalic and atraumatic.  Bilateral injected conjunctiva  Eyes: Pupils are equal, round, and reactive to light.  Pupils 4 mm reactive bilaterally  Cardiovascular: Normal rate, regular rhythm and normal heart sounds.   No murmur heard. Pulmonary/Chest: Effort normal and breath sounds normal. No respiratory distress. He has no wheezes.  Abdominal: Soft. Bowel sounds are normal. There is no tenderness. There is no rebound.  Musculoskeletal: He exhibits no edema.  Neurological: He is alert and oriented to person, place, and time.  Slight resting tremor noted  Skin: Skin is warm and dry.  Psychiatric: He has a normal mood and affect.  Nursing note and vitals reviewed.    ED Treatments / Results  Labs (all labs ordered are listed, but only abnormal results are displayed) Labs Reviewed - No data to display  EKG  EKG Interpretation None       Radiology No results found.  Procedures Procedures (including critical care time)  Medications Ordered in ED Medications  ondansetron (ZOFRAN-ODT) disintegrating tablet 4 mg (4 mg Oral Given 01/19/17 0446)     Initial Impression / Assessment and Plan / ED Course  I have reviewed the triage vital signs and the nursing notes.  Pertinent labs & imaging results that were available during my care of the patient were reviewed by me and considered in my medical decision making (see chart for details).  Clinical Course as of Jan 20 508    Wed Jan 19, 2017  0508 Patient tolerating p.o. without difficulty.    [CH]    Clinical Course User Index [CH] Horton, Barbette Hair, MD    Patient presents with alcohol intoxication.  States he does not feel well but has slept in the waiting room for the last 8 hours.  He has a mild tremor but otherwise no significant signs of withdrawal.  Heart rate is 100.  Blood pressure 155/82.  Exam is largely benign.  Patient was given Zofran and is tolerating fluids without difficulty.  Patient has been medically screened for acute emergency.  Will discharge home.  After history, exam, and medical workup I feel the patient has been appropriately medically screened and is safe for discharge home. Pertinent diagnoses were discussed with the patient. Patient was given return precautions.   Final Clinical Impressions(s) / ED Diagnoses   Final diagnoses:  Alcoholic intoxication without complication ([place])    New Prescriptions New Prescriptions   No medications on file     Horton,  Barbette Hair, MD 01/19/17 [phone]

## 2017-01-19 NOTE — ED Notes (Signed)
Pt was given sprite and [place] sandwich for fluid/PO challenge.

## 2017-02-08 ENCOUNTER — Other Ambulatory Visit: Payer: Self-pay

## 2017-02-08 ENCOUNTER — Inpatient Hospital Stay (HOSPITAL_COMMUNITY)
Admission: EM | Admit: 2017-02-08 | Discharge: 2017-02-12 | DRG: 378 | Disposition: A | Discharge status: Home / Self Care | Payer: Self-pay | Attending: Internal Medicine | Admitting: Internal Medicine

## 2017-02-08 ENCOUNTER — Encounter (HOSPITAL_COMMUNITY): Payer: Self-pay

## 2017-02-08 DIAGNOSIS — D500 Iron deficiency anemia secondary to blood loss (chronic): Secondary | ICD-10-CM

## 2017-02-08 DIAGNOSIS — K921 Melena: Principal | ICD-10-CM | POA: Diagnosis present

## 2017-02-08 DIAGNOSIS — Z811 Family history of alcohol abuse and dependence: Secondary | ICD-10-CM

## 2017-02-08 DIAGNOSIS — F17210 Nicotine dependence, cigarettes, uncomplicated: Secondary | ICD-10-CM | POA: Diagnosis present

## 2017-02-08 DIAGNOSIS — Z8711 Personal history of peptic ulcer disease: Secondary | ICD-10-CM

## 2017-02-08 DIAGNOSIS — I10 Essential (primary) hypertension: Secondary | ICD-10-CM | POA: Diagnosis present

## 2017-02-08 DIAGNOSIS — Z833 Family history of diabetes mellitus: Secondary | ICD-10-CM

## 2017-02-08 DIAGNOSIS — K922 Gastrointestinal hemorrhage, unspecified: Secondary | ICD-10-CM | POA: Diagnosis present

## 2017-02-08 DIAGNOSIS — Z720 Tobacco use: Secondary | ICD-10-CM | POA: Diagnosis present

## 2017-02-08 DIAGNOSIS — T471X6A Underdosing of other antacids and anti-gastric-secretion drugs, initial encounter: Secondary | ICD-10-CM | POA: Diagnosis present

## 2017-02-08 DIAGNOSIS — K7010 Alcoholic hepatitis without ascites: Secondary | ICD-10-CM | POA: Diagnosis present

## 2017-02-08 DIAGNOSIS — F10939 Alcohol use, unspecified with withdrawal, unspecified: Secondary | ICD-10-CM | POA: Diagnosis present

## 2017-02-08 DIAGNOSIS — Z91128 Patient's intentional underdosing of medication regimen for other reason: Secondary | ICD-10-CM

## 2017-02-08 DIAGNOSIS — Z825 Family history of asthma and other chronic lower respiratory diseases: Secondary | ICD-10-CM

## 2017-02-08 DIAGNOSIS — D62 Acute posthemorrhagic anemia: Secondary | ICD-10-CM | POA: Diagnosis present

## 2017-02-08 DIAGNOSIS — G40909 Epilepsy, unspecified, not intractable, without status epilepticus: Secondary | ICD-10-CM | POA: Diagnosis present

## 2017-02-08 DIAGNOSIS — R569 Unspecified convulsions: Secondary | ICD-10-CM

## 2017-02-08 DIAGNOSIS — Z801 Family history of malignant neoplasm of trachea, bronchus and lung: Secondary | ICD-10-CM

## 2017-02-08 DIAGNOSIS — Z590 Homelessness: Secondary | ICD-10-CM

## 2017-02-08 DIAGNOSIS — F10239 Alcohol dependence with withdrawal, unspecified: Secondary | ICD-10-CM | POA: Diagnosis present

## 2017-02-08 LAB — PROTIME-INR
INR: 0.95
Prothrombin Time: 12.6 seconds (ref 11.4–15.2)

## 2017-02-08 LAB — COMPREHENSIVE METABOLIC PANEL
ALBUMIN: 3.7 g/dL (ref 3.5–5.0)
ALT: 93 U/L — ABNORMAL HIGH (ref 17–63)
AST: 215 U/L — AB (ref 15–41)
Alkaline Phosphatase: 104 U/L (ref 38–126)
Anion gap: 10 (ref 5–15)
BUN: 9 mg/dL (ref 6–20)
CHLORIDE: 102 mmol/L (ref 101–111)
CO2: 22 mmol/L (ref 22–32)
Calcium: 8.8 mg/dL — ABNORMAL LOW (ref 8.9–10.3)
Creatinine, Ser: 0.76 mg/dL (ref 0.61–1.24)
GFR calc Af Amer: >60 mL/min (ref >60)
GLUCOSE: 95 mg/dL (ref 65–99)
POTASSIUM: 3.4 mmol/L — AB (ref 3.5–5.1)
SODIUM: 134 mmol/L — AB (ref 135–145)
TOTAL PROTEIN: 7 g/dL (ref 6.5–8.1)
Total Bilirubin: 0.4 mg/dL (ref 0.3–1.2)

## 2017-02-08 LAB — ETHANOL: ALCOHOL ETHYL (B): 226 mg/dL — AB (ref <10)

## 2017-02-08 LAB — CBC
HEMATOCRIT: 20.8 % — AB (ref 39.0–52.0)
Hemoglobin: 6.7 g/dL — CL (ref 13.0–17.0)
MCH: 25.5 pg — ABNORMAL LOW (ref 26.0–34.0)
MCHC: 32.2 g/dL (ref 30.0–36.0)
MCV: 79.1 fL (ref 78.0–100.0)
Platelets: 157 10*3/uL (ref 150–400)
RBC: 2.63 MIL/uL — ABNORMAL LOW (ref 4.22–5.81)
RDW: 17.5 % — AB (ref 11.5–15.5)
WBC: 6.6 10*3/uL (ref 4.0–10.5)

## 2017-02-08 LAB — LIPASE, BLOOD: Lipase: 58 U/L — ABNORMAL HIGH (ref 11–51)

## 2017-02-08 LAB — POC OCCULT BLOOD, ED: Fecal Occult Bld: POSITIVE — AB

## 2017-02-08 LAB — PREPARE RBC (CROSSMATCH)

## 2017-02-08 MED ORDER — SODIUM CHLORIDE 0.9 % IV BOLUS (SEPSIS)
1000.0000 mL | Freq: Once | INTRAVENOUS | Status: AC
  Administered 2017-02-08: 1000 mL via INTRAVENOUS

## 2017-02-08 MED ORDER — PANTOPRAZOLE SODIUM 40 MG IV SOLR
40.0000 mg | Freq: Once | INTRAVENOUS | Status: AC
  Administered 2017-02-08: 40 mg via INTRAVENOUS
  Filled 2017-02-08: qty 40

## 2017-02-08 MED ORDER — SODIUM CHLORIDE 0.9 % IV SOLN
10.0000 mL/h | Freq: Once | INTRAVENOUS | Status: AC
  Administered 2017-02-08: 10 mL/h via INTRAVENOUS

## 2017-02-08 MED ORDER — THIAMINE HCL 100 MG/ML IJ SOLN
100.0000 mg | Freq: Once | INTRAMUSCULAR | Status: AC
  Administered 2017-02-08: 100 mg via INTRAVENOUS
  Filled 2017-02-08: qty 2

## 2017-02-08 MED ORDER — LORAZEPAM 2 MG/ML IJ SOLN
1.0000 mg | Freq: Once | INTRAMUSCULAR | Status: AC
  Administered 2017-02-08: 1 mg via INTRAVENOUS
  Filled 2017-02-08: qty 1

## 2017-02-08 NOTE — ED Triage Notes (Signed)
Pt presents with [facility] c/o blood in stool since Sunday. He states that his stools have been "black diarrhea." He endorses drink 1 beer.  A&Ox4.

## 2017-02-08 NOTE — H&P (Signed)
History and Physical    Barry Moore OAC:[id number] DOB: November 10, 1965 DOA: 02/08/2017  PCP: Patient, No Pcp Per   Patient coming from: Homeless.  I have personally briefly reviewed patient's old medical records in [place]  Chief Complaint: "Black diarrhea"  HPI: Barry Moore is a 51 y.o. male with medical history significant of alcohol abuse, history of alcoholic hepatitis, depression, history of alcohol induced mood disorder, hypertension, seizure disorder who is coming to the emergency department with complaints of having melena since Sunday associated with epigastric abdominal pain.  He also complains of postural dizziness and palpitations. He denies nausea, emesis, constipation or hematochezia. Denies fever, chills, headaches, dyspnea, chest pain, diaphoresis, PND, orthopnea or pitting edema of the lower extremities.  Denies GU symptoms.  Patient stated that he feels like he is getting alcohol withdrawal.  ED Course: Initial vital signs in the emergency department temperature 36.7C, pulse 90, respirations 14, blood pressure 109/86 mmHg and O2 sat 100% on room air.  Patient was given Protonix 40 mg IVP, thiamine 100 mg IVP, 1000 mL normal saline bolus and a 2 unit PRBC transfusion was started.  Workup shows positive FOBT.  WBC was 6.6, hemoglobin 6.7 g/dL and platelets 157.  Sodium was 134, potassium 3.4 mmol/L.  His calcium was 8.8 and ethanol level was 226 mg/dL.  AST was 215 and ALT 93 u/L. All other chemistry values were normal.  Review of Systems: As per HPI otherwise 10 point review of systems negative.    Past Medical History:  Diagnosis Date  . Alcoholic hepatitis   . Depression   . ETOH abuse   . Hypertension   . Seizures ([place])     Past Surgical History:  Procedure Laterality Date  . ESOPHAGOGASTRODUODENOSCOPY (EGD) WITH PROPOFOL N/A 11/02/2016   Procedure: ESOPHAGOGASTRODUODENOSCOPY (EGD) WITH PROPOFOL;  Surgeon: Carol Ada, MD;  Location: [facility];   Service: Endoscopy;  Laterality: N/A;  . HERNIA REPAIR    . LEG SURGERY       reports that he has been smoking cigarettes.  He has been smoking about 1.00 pack per day. he has never used smokeless tobacco. He reports that he drinks alcohol. He reports that he uses drugs. Drug: Cocaine. Frequency: 3.00 times per week.  Allergies  Allergen Reactions  . Aspirin Nausea And Vomiting    Family History  Problem Relation Age of Onset  . Diabetes Mother   . Alcoholism Mother   . Emphysema Father   . Lung cancer Father   . Alcoholism Father     Prior to Admission medications   Medication Sig Start Date End Date Taking? Authorizing Provider  acetaminophen (TYLENOL) 500 MG tablet Take 1,000 mg by mouth every 6 (six) hours as needed for headache.   Yes [provider]  acetaminophen (TYLENOL) 325 MG tablet Take 2 tablets (650 mg total) by mouth every 6 (six) hours as needed. Patient not taking: Reported on 02/08/2017 12/13/16   Varney Biles, MD  azithromycin (ZITHROMAX) 250 MG tablet Take 1 tablet (250 mg total) by mouth daily. Take first 2 tablets together, then 1 every day until finished. Patient not taking: Reported on 02/08/2017 12/13/16   Varney Biles, MD  gabapentin (NEURONTIN) 300 MG capsule Take 1 capsule (300 mg total) by mouth 3 (three) times daily. Patient not taking: Reported on 02/08/2017 12/30/16   Ethelene Hal, NP  HYDROcodone-acetaminophen (NORCO/VICODIN) 5-325 MG tablet Take 1 tablet by mouth every 6 (six) hours as needed. Patient not taking: Reported on  02/08/2017 12/13/16   Varney Biles, MD  hydrOXYzine (ATARAX/VISTARIL) 50 MG tablet Take 1 tablet (50 mg total) by mouth 3 (three) times daily. Patient not taking: Reported on 02/08/2017 12/30/16   Ethelene Hal, NP  omeprazole (PRILOSEC) 20 MG capsule Take 1 capsule (20 mg total) by mouth daily. Patient not taking: Reported on 02/08/2017 12/13/16   Varney Biles, MD  traZODone (DESYREL) 50 MG  tablet Take 1 tablet (50 mg total) by mouth at bedtime. Patient not taking: Reported on 02/08/2017 12/30/16   Ethelene Hal, NP    Physical Exam: Vitals:   02/08/17 2253 02/08/17 2330 02/08/17 2340 02/08/17 2342  BP: 125/78 125/67 125/67   Pulse: 91 94 95 95  Resp: 18 17 17 17   Temp:  98.4 F (36.9 C) 98.4 F (36.9 C)   TempSrc:  Oral Oral   SpO2: 94% 100% 100%     Constitutional: Disheveled, but in NAD, calm, comfortable. Eyes: PERRL, lids and conjunctivae are pale. ENMT: Mucous membranes are moist. Posterior pharynx clear of any exudate or lesions. Poor state of repair of dentition.  Neck: normal, supple, no masses, no thyromegaly Respiratory: Clear to auscultation bilaterally, no wheezing, no crackles. Normal respiratory effort. No accessory muscle use.  Cardiovascular: Regular rate and rhythm, no murmurs / rubs / gallops. No extremity edema. 2+ pedal pulses. No carotid bruits.  Abdomen: Soft, positive epigastric tenderness, no guarding/rebound/masses palpated. No hepatosplenomegaly. Bowel sounds positive.  Musculoskeletal: no clubbing / cyanosis. No joint deformity upper and lower extremities. Good ROM, no contractures. Normal muscle tone.  Skin: no significant rashes, lesions, ulcers on limited skin exam. Neurologic: CN 2-12 grossly intact. Sensation intact, DTR normal. Strength 5/5 in all 4.  Psychiatric: Alert and oriented x 4.  Mildly anxious mood.    Labs on Admission: I have personally reviewed following labs and imaging studies  CBC: Recent Labs  Lab 02/08/17 2145  WBC 6.6  HGB 6.7*  HCT 20.8*  MCV 79.1  PLT 242   Basic Metabolic Panel: Recent Labs  Lab 02/08/17 2145  NA 134*  K 3.4*  CL 102  CO2 22  GLUCOSE 95  BUN 9  CREATININE 0.76  CALCIUM 8.8*   GFR: CrCl cannot be calculated (Unknown ideal weight.). Liver Function Tests: Recent Labs  Lab 02/08/17 2145  AST 215*  ALT 93*  ALKPHOS 104  BILITOT 0.4  PROT 7.0  ALBUMIN 3.7    Recent Labs  Lab 02/08/17 2240  LIPASE 58*   No results for input(s): AMMONIA in the last 168 hours. Coagulation Profile: Recent Labs  Lab 02/08/17 2240  INR 0.95   Cardiac Enzymes: No results for input(s): CKTOTAL, CKMB, CKMBINDEX, TROPONINI in the last 168 hours. BNP (last 3 results) No results for input(s): PROBNP in the last 8760 hours. HbA1C: No results for input(s): HGBA1C in the last 72 hours. CBG: No results for input(s): GLUCAP in the last 168 hours. Lipid Profile: No results for input(s): CHOL, HDL, LDLCALC, TRIG, CHOLHDL, LDLDIRECT in the last 72 hours. Thyroid Function Tests: No results for input(s): TSH, T4TOTAL, FREET4, T3FREE, THYROIDAB in the last 72 hours. Anemia Panel: No results for input(s): VITAMINB12, FOLATE, FERRITIN, TIBC, IRON, RETICCTPCT in the last 72 hours. Urine analysis:    Component Value Date/Time   COLORURINE STRAW (A) 12/04/2016 2059   APPEARANCEUR CLEAR 12/04/2016 2059   LABSPEC 1.002 (L) 12/04/2016 2059   PHURINE 6.0 12/04/2016 2059   GLUCOSEU NEGATIVE 12/04/2016 2059   HGBUR NEGATIVE 12/04/2016 2059  [place] NEGATIVE 12/04/2016 2059   [place] NEGATIVE 12/04/2016 2059   PROTEINUR NEGATIVE 12/04/2016 2059   NITRITE NEGATIVE 12/04/2016 2059   LEUKOCYTESUR NEGATIVE 12/04/2016 2059    Radiological Exams on Admission: No results found.  EKG: Independently reviewed.  Assessment/Plan Principal Problem:   UGI bleed Admit to stepdown/inpatient. Continue PRBC transfusion. Monitor hematocrit and hemoglobin. Pantoprazole 40 mg IVP every 12 hours. Please consult GI in the morning.  Active Problems:   Alcohol withdrawal (HCC) CiWA protocol has been started with IV lorazepam. Continue thiamine, folic acid and multivitamin supplementation. Magnesium sulfate 2 g IVPB given.    Seizure [facility]) The patient was unable to specify which type, likely due to EtOH withdrawal. Not currently taking anti-epileptic medications.     Tobacco abuse Declined nicotine replacement therapy. Tobacco cessation information to be provided by the staff.    Essential hypertension Currently not taking antihypertensives. Will defer the use of medications at this time due to low H&H. Monitor blood pressure.     DVT prophylaxis: SCDs. Code Status: Full code. Family Communication:  Disposition Plan: Admit for blood transfusion and alcohol withdrawal treatment. GI to evaluate in the morning. Consults called:  Admission status: Inpatient/stepdown.   Reubin Milan MD [facility] Pager [phone].  If 7PM-7AM, please contact night-coverage [email or web address] Password [email or web address]  02/08/2017, 11:55 PM

## 2017-02-08 NOTE — ED Provider Notes (Signed)
[facility]-ICU/STEPDOWN Provider Note   CSN: [id number] Arrival date & time: 02/08/17  2059     History   Chief Complaint Chief Complaint  Patient presents with  . Rectal Bleeding    HPI Barry Moore is a 51 y.o. male.  The history is provided by the patient. No language interpreter was used.  Rectal Bleeding    Barry Moore is a 51 y.o. male who presents to the Emergency Department complaining of black stools.  He reports of black stools for the last 3 days.  He has associated lower abdominal pain is been ongoing for the last several weeks.  He endorses associated generalized weakness and fatigue.  He does have a history of alcohol abuse and drinks alcohol regularly.  Last drink was today.  He denies any nausea or vomiting.  Symptoms are moderate, constant, worsening.  Past Medical History:  Diagnosis Date  . Alcoholic hepatitis   . Depression   . ETOH abuse   . Hypertension   . Seizures [facility])     Patient Active Problem List   Diagnosis Date Noted  . UGI bleed 02/08/2017  . Alcohol withdrawal ([place]) 02/08/2017  . Cocaine abuse ([place]) 11/18/2016  . GI bleed 11/01/2016  . Alcohol abuse   . Major depressive disorder, recurrent severe without psychotic features ([place]) 07/06/2016  . Alcohol-induced mood disorder ([place]) 06/15/2016  . Seizure ([place]) 05/26/2016  . Tobacco abuse 05/26/2016  . Homeless 05/26/2016  . Essential hypertension 05/26/2016  . Alcohol dependence with alcohol-induced mood disorder ([place]) 02/14/2016  . Severe alcohol withdrawal without perceptual disturbances without complication ([place]) 75/12/2583  . Alcoholic hepatitis without ascites 02/14/2016  . Thrombocytopenia ([place]) 02/14/2016    Past Surgical History:  Procedure Laterality Date  . ESOPHAGOGASTRODUODENOSCOPY (EGD) WITH PROPOFOL N/A 11/02/2016   Procedure: ESOPHAGOGASTRODUODENOSCOPY (EGD) WITH PROPOFOL;  Surgeon: Carol Ada, MD;  Location: [facility];  Service:  Endoscopy;  Laterality: N/A;  . HERNIA REPAIR    . LEG SURGERY         Home Medications    Prior to Admission medications   Medication Sig Start Date End Date Taking? Authorizing Provider  acetaminophen (TYLENOL) 500 MG tablet Take 1,000 mg by mouth every 6 (six) hours as needed for headache.   Yes [provider]  acetaminophen (TYLENOL) 325 MG tablet Take 2 tablets (650 mg total) by mouth every 6 (six) hours as needed. Patient not taking: Reported on 02/08/2017 12/13/16   Varney Biles, MD  azithromycin (ZITHROMAX) 250 MG tablet Take 1 tablet (250 mg total) by mouth daily. Take first 2 tablets together, then 1 every day until finished. Patient not taking: Reported on 02/08/2017 12/13/16   Varney Biles, MD  gabapentin (NEURONTIN) 300 MG capsule Take 1 capsule (300 mg total) by mouth 3 (three) times daily. Patient not taking: Reported on 02/08/2017 12/30/16   Ethelene Hal, NP  HYDROcodone-acetaminophen (NORCO/VICODIN) 5-325 MG tablet Take 1 tablet by mouth every 6 (six) hours as needed. Patient not taking: Reported on 02/08/2017 12/13/16   Varney Biles, MD  hydrOXYzine (ATARAX/VISTARIL) 50 MG tablet Take 1 tablet (50 mg total) by mouth 3 (three) times daily. Patient not taking: Reported on 02/08/2017 12/30/16   Ethelene Hal, NP  omeprazole (PRILOSEC) 20 MG capsule Take 1 capsule (20 mg total) by mouth daily. Patient not taking: Reported on 02/08/2017 12/13/16   Varney Biles, MD  traZODone (DESYREL) 50 MG tablet Take 1 tablet (50 mg total) by mouth at bedtime. Patient not taking:  Reported on 02/08/2017 12/30/16   Ethelene Hal, NP    Family History Family History  Problem Relation Age of Onset  . Diabetes Mother   . Alcoholism Mother   . Emphysema Father   . Lung cancer Father   . Alcoholism Father     Social History Social History   Tobacco Use  . Smoking status: Current Every Day Smoker    Packs/day: 1.00    Types: Cigarettes  .  Smokeless tobacco: Never Used  Substance Use Topics  . Alcohol use: Yes    Comment: BAC on admission was 386  . Drug use: Yes    Frequency: 3.0 times per week    Types: Cocaine    Comment: uds + Coc     Allergies   Aspirin   Review of Systems Review of Systems  Gastrointestinal: Positive for hematochezia.  All other systems reviewed and are negative.    Physical Exam Updated Vital Signs BP 132/67 (BP Location: Left Arm)   Pulse 95   Temp 98.4 F (36.9 C) (Oral)   Resp 20   Ht 5\' 8"  (1.727 m)   Wt 79.1 kg (174 lb 6.1 oz)   SpO2 98%   BMI 26.51 kg/m   Physical Exam  Constitutional: He is oriented to person, place, and time. He appears well-developed and well-nourished.  Disheveled  HENT:  Head: Normocephalic and atraumatic.  Cardiovascular: Regular rhythm.  No murmur heard. tachcyardic  Pulmonary/Chest: Effort normal and breath sounds normal. No respiratory distress.  Abdominal: Soft. There is no rebound and no guarding.  Moderate diffuse abdominal tenderness  Genitourinary:  Genitourinary Comments: Melanotic stool  Musculoskeletal: He exhibits no edema or tenderness.  Neurological: He is alert and oriented to person, place, and time.  Skin: Skin is warm and dry.  Psychiatric: He has a normal mood and affect. His behavior is normal.  Nursing note and vitals reviewed.    ED Treatments / Results  Labs (all labs ordered are listed, but only abnormal results are displayed) Labs Reviewed  COMPREHENSIVE METABOLIC PANEL - Abnormal; Notable for the following components:      Result Value   Sodium 134 (*)    Potassium 3.4 (*)    Calcium 8.8 (*)    AST 215 (*)    ALT 93 (*)    All other components within normal limits  CBC - Abnormal; Notable for the following components:   RBC 2.63 (*)    Hemoglobin 6.7 (*)    HCT 20.8 (*)    MCH 25.5 (*)    RDW 17.5 (*)    All other components within normal limits  ETHANOL - Abnormal; Notable for the following  components:   Alcohol, Ethyl (B) 226 (*)    All other components within normal limits  LIPASE, BLOOD - Abnormal; Notable for the following components:   Lipase 58 (*)    All other components within normal limits  POC OCCULT BLOOD, ED - Abnormal; Notable for the following components:   Fecal Occult Bld POSITIVE (*)    All other components within normal limits  MRSA PCR SCREENING  PROTIME-INR  MAGNESIUM  PHOSPHORUS  TYPE AND SCREEN  PREPARE RBC (CROSSMATCH)    EKG  EKG Interpretation None       Radiology No results found.  Procedures Procedures (including critical care time) CRITICAL CARE Performed by: Quintella Reichert   Total critical care time: 35 minutes  Critical care time was exclusive of separately billable procedures and treating  other patients.  Critical care was necessary to treat or prevent imminent or life-threatening deterioration.  Critical care was time spent personally by me on the following activities: development of treatment plan with patient and/or surrogate as well as nursing, discussions with consultants, evaluation of patient's response to treatment, examination of patient, obtaining history from patient or surrogate, ordering and performing treatments and interventions, ordering and review of laboratory studies, ordering and review of radiographic studies, pulse oximetry and re-evaluation of patient's condition.  Medications Ordered in ED Medications  sodium chloride 0.9 % bolus 1,000 mL (0 mLs Intravenous Stopped 02/08/17 2345)  0.9 %  sodium chloride infusion (0 mL/hr Intravenous Stopping Infusion hung by another clincian 02/09/17 0049)  thiamine (B-1) injection 100 mg (100 mg Intravenous Given 02/08/17 2302)  LORazepam (ATIVAN) injection 1 mg (1 mg Intravenous Given 02/08/17 2305)  pantoprazole (PROTONIX) injection 40 mg (40 mg Intravenous Given 02/08/17 2300)     Initial Impression / Assessment and Plan / ED Course  I have reviewed the triage  vital signs and the nursing notes.  Pertinent labs & imaging results that were available during my care of the patient were reviewed by me and considered in my medical decision making (see chart for details).     Patient here for evaluation of weakness, black stools at home.  He is generally weak on examination and intoxicated.  He does have melanotic stool and hemoglobin of 6.7.  He was treated with IV fluids, Protonix, PRBC transfusion for symptomatic anemia.  Hospitalist consulted for admission for further treatment.  Final Clinical Impressions(s) / ED Diagnoses   Final diagnoses:  None    ED Discharge Orders    None       Quintella Reichert, MD 02/09/17 [phone]

## 2017-02-09 LAB — MAGNESIUM: Magnesium: 2.3 mg/dL (ref 1.7–2.4)

## 2017-02-09 LAB — COMPREHENSIVE METABOLIC PANEL
ALK PHOS: 86 U/L (ref 38–126)
ALT: 100 U/L — AB (ref 17–63)
AST: 233 U/L — AB (ref 15–41)
Albumin: 3.2 g/dL — ABNORMAL LOW (ref 3.5–5.0)
Anion gap: 7 (ref 5–15)
BILIRUBIN TOTAL: 0.8 mg/dL (ref 0.3–1.2)
BUN: 8 mg/dL (ref 6–20)
CALCIUM: 7.8 mg/dL — AB (ref 8.9–10.3)
CHLORIDE: 108 mmol/L (ref 101–111)
CO2: 21 mmol/L — ABNORMAL LOW (ref 22–32)
CREATININE: 0.74 mg/dL (ref 0.61–1.24)
Glucose, Bld: 89 mg/dL (ref 65–99)
Potassium: 3.7 mmol/L (ref 3.5–5.1)
Sodium: 136 mmol/L (ref 135–145)
Total Protein: 6.1 g/dL — ABNORMAL LOW (ref 6.5–8.1)

## 2017-02-09 LAB — MRSA PCR SCREENING: MRSA by PCR: NEGATIVE

## 2017-02-09 LAB — CBC WITH DIFFERENTIAL/PLATELET
BASOS ABS: 0 10*3/uL (ref 0.0–0.1)
Basophils Relative: 1 %
EOS PCT: 4 %
Eosinophils Absolute: 0.2 10*3/uL (ref 0.0–0.7)
HEMATOCRIT: 24 % — AB (ref 39.0–52.0)
HEMOGLOBIN: 7.9 g/dL — AB (ref 13.0–17.0)
LYMPHS ABS: 1 10*3/uL (ref 0.7–4.0)
LYMPHS PCT: 20 %
MCH: 26.5 pg (ref 26.0–34.0)
MCHC: 32.9 g/dL (ref 30.0–36.0)
MCV: 80.5 fL (ref 78.0–100.0)
Monocytes Absolute: 0.6 10*3/uL (ref 0.1–1.0)
Monocytes Relative: 12 %
NEUTROS ABS: 3.1 10*3/uL (ref 1.7–7.7)
NEUTROS PCT: 63 %
PLATELETS: 146 10*3/uL — AB (ref 150–400)
RBC: 2.98 MIL/uL — AB (ref 4.22–5.81)
RDW: 17.1 % — ABNORMAL HIGH (ref 11.5–15.5)
WBC: 4.9 10*3/uL (ref 4.0–10.5)

## 2017-02-09 LAB — PHOSPHORUS: Phosphorus: 4.5 mg/dL (ref 2.5–4.6)

## 2017-02-09 LAB — HEMOGLOBIN: Hemoglobin: 8.8 g/dL — ABNORMAL LOW (ref 13.0–17.0)

## 2017-02-09 MED ORDER — LORAZEPAM 2 MG/ML IJ SOLN
2.0000 mg | INTRAMUSCULAR | Status: DC | PRN
  Administered 2017-02-09 – 2017-02-11 (×6): 2 mg via INTRAVENOUS
  Filled 2017-02-09 (×6): qty 1

## 2017-02-09 MED ORDER — HYDROCODONE-ACETAMINOPHEN 5-325 MG PO TABS: 1.0000 | ORAL_TABLET | ORAL | Status: DC | PRN

## 2017-02-09 MED ORDER — ADULT MULTIVITAMIN W/MINERALS CH
1.0000 | ORAL_TABLET | Freq: Every day | ORAL | Status: DC
  Administered 2017-02-10 – 2017-02-12 (×3): 1 via ORAL
  Filled 2017-02-09 (×3): qty 1

## 2017-02-09 MED ORDER — ENSURE ENLIVE PO LIQD
237.0000 mL | Freq: Two times a day (BID) | ORAL | Status: DC
  Administered 2017-02-10 – 2017-02-12 (×6): 237 mL via ORAL

## 2017-02-09 MED ORDER — PANTOPRAZOLE SODIUM 40 MG IV SOLR
40.0000 mg | Freq: Two times a day (BID) | INTRAVENOUS | Status: DC
  Administered 2017-02-09 – 2017-02-10 (×4): 40 mg via INTRAVENOUS
  Filled 2017-02-09 (×4): qty 40

## 2017-02-09 NOTE — Progress Notes (Signed)
Initial Nutrition Assessment  DOCUMENTATION CODES:   Not applicable  INTERVENTION:    Provide MVI daily  Ensure Enlive po BID, each supplement provides 350 kcal and 20 grams of protein  NUTRITION DIAGNOSIS:   Inadequate oral intake related to social / environmental circumstances(homlessness/food insecurity) as evidenced by per patient/family report.  GOAL:   Patient will meet greater than or equal to 90% of their needs  MONITOR:   PO intake, Supplement acceptance, Weight trends, Labs  REASON FOR ASSESSMENT:   Malnutrition Screening Tool    ASSESSMENT:   Pt with PMH significant for alcohol abuse, homelessness, alcoholic hepatitis, HTN, and seizures. Recently admitted for upper GI bleed 11/03/16, had EGD that reveled duodenal ulcer, was prescribed protonix but did not fill the prescription.  Presents this admission with complaints of melena that started 3 days prior to admission and epigastric abdominal pain. Admitted for lower GI bleed and alcohol withdrawal.    Spoke with pt at bedside. Observed pt to be restless, constantly moving his extremities. Currently NPO.  Pt denies any loss in appetite but had significant decreased PO intake in the last three months related to food insecurity. Pt typically eats soup twice per day and drinks 2-4 40 oz beers. Pt does not go to homeless shelter or soup kitchens because he "cant stand the crowd." Discussed the effects alcohol has on nutrition status. Pt amendable to supplementation this hospital stay.  Pt reports a UBW of 180 lb and a recent wt loss of 10 lb. Records indicate pt has gained weight from admission in August 2018.  Nutrition-Focused physical exam completed. See findings below.   Medications reviewed and include: protonix Labs reviewed: Lipase 58 (H) AST 233 (H) ALT 100 (H)  NUTRITION - FOCUSED PHYSICAL EXAM:    Most Recent Value  Orbital Region  No depletion  Upper Arm Region  No depletion  Thoracic and Lumbar Region   Unable to assess  Buccal Region  No depletion  Temple Region  No depletion  Clavicle Bone Region  No depletion  Clavicle and Acromion Bone Region  No depletion  Scapular Bone Region  Unable to assess  Dorsal Hand  No depletion  Patellar Region  No depletion  Anterior Thigh Region  No depletion  Posterior Calf Region  No depletion  Edema (RD Assessment)  None  Hair  Reviewed  Eyes  Reviewed  Mouth  Unable to assess  Skin  Reviewed  Nails  Reviewed       Diet Order:  Diet NPO time specified  EDUCATION NEEDS:   Education needs have been addressed  Skin:  Skin Assessment: Reviewed RN Assessment  Last BM:  02/08/17  Height:   Ht Readings from Last 1 Encounters:  02/09/17 5\' 8"  (1.727 m)    Weight:   Wt Readings from Last 1 Encounters:  02/09/17 174 lb 6.1 oz (79.1 kg)    Ideal Body Weight:  70 kg  BMI:  Body mass index is 26.51 kg/m.  Estimated Nutritional Needs:   Kcal:  1900-2100 kcal/day  Protein:  90-100 g/day  Fluid:  >1.9 L/day    Mariana Single RD, LDN Clinical Nutrition Pager # - [phone]

## 2017-02-09 NOTE — Progress Notes (Signed)
[facility] PROGRESS NOTE    Progress Note  Barry Moore  WUJ:[id number] DOB: 05/22/1965 DOA: 02/08/2017 PCP: Patient, No Pcp Per     Brief Narrative:   Barry Moore is an 51 y.o. male past medical history a lot of all views and alcoholic hepatitis, essential hypertension seizure disorder, recently discharged from the hospital for upper GI bleed with an EGD on 11/03/2016 that showed duodenal ulcer without stigmata of bleeding, portal gastropathy and duodenitis, who is coming into the emergency room complaining of a multiple melanotic stools that started 3 days prior to admission with epigastric abdominal pain and postural  Assessment/Plan:   Lower GI bleed: Hemoglobin on 12/12/2016 was 10.5 on admission and EGD was 6.7 Status post one unit of packed red blood cells, head is repeated hemoglobin is 7.9 Recent EGD on 11/03/2016 that showed duodenal ulcer. Will continue conservative management with IV Protonix. He related he never filled his Protonix when he was discharged on 11/03/2016.  Alcohol withdrawal (HCC) Continue Ativan protocol, the with oral thiamine and folate.  Elevated LFTs: Ratio is 2-1, AST to ALT. Likely due to abuse Billi of 0.8 and alkaline phosphatase is 88.  Seizure ([place]) No signs of withdrawals, he is currently not taking antiepileptic medication. Was not able to specify which medication he is taking.  Tobacco abuse He declined nicotine patch, he was counseled.  Essential hypertension Blood pressure seems to be stable continue to hold antihypertensive medication.   DVT prophylaxis: SCD's Family Communication:none Disposition Plan/Barrier to D/C: keep in SDU Code Status:     Code Status Orders  (From admission, onward)        Start     Ordered   02/09/17 0107  Full code  Continuous     02/09/17 0106    Code Status History    Date Active Date Inactive Code Status Order ID Comments User Context   12/28/2016 18:01 12/30/2016 17:35 Full  Code [id number]  Daleen Bo, MD ED   11/22/2016 23:06 11/23/2016 17:32 Full Code [id number]  Ward, [name], PA-C ED   11/18/2016 06:25 11/18/2016 14:25 Full Code [id number]  Orpah Greek, MD ED   11/01/2016 13:10 11/03/2016 21:30 Full Code [id number]  Merton Border, MD ED   11/01/2016 12:44 11/01/2016 13:10 Full Code [id number]  Merton Border, MD ED   10/15/2016 05:04 10/15/2016 19:02 Full Code [id number]  Street, [place], PA-C ED   08/19/2016 16:25 08/20/2016 13:59 Full Code [id number]  Kinnie Feil, PA-C ED   08/13/2016 15:57 08/14/2016 20:31 Full Code [id number]  Blanchie Dessert, MD ED   07/06/2016 12:00 07/12/2016 18:28 Full Code [id number]  Patrecia Pour, NP Inpatient   07/05/2016 22:30 07/06/2016 11:32 Full Code [id number]  Drenda Freeze, MD ED   06/15/2016 23:47 06/23/2016 18:54 Full Code [id number]  Laverle Hobby, PA-C Inpatient   06/14/2016 22:07 06/15/2016 23:12 Full Code [id number]  Tanna Furry, MD ED   05/26/2016 19:55 05/29/2016 15:47 Full Code [id number]  Ivor Costa, MD ED   05/26/2016 18:04 05/26/2016 19:55 Full Code [id number]  Renita Papa, [place] ED   03/05/2016 18:26 03/09/2016 21:12 Full Code [id number]  Benjamine Mola, [place] Inpatient   03/05/2016 18:26 03/05/2016 18:26 Full Code [id number]  Benjamine Mola, [place] Inpatient   03/04/2016 18:38 03/05/2016 16:26 Full Code [id number]  Aaron Edelman ED   02/14/2016 23:35 02/19/2016 15:34 Full Code [id number]  Edwin Dada, MD ED   02/14/2016 13:23 02/14/2016 23:35 Full Code [id number]  Lacretia Leigh, MD  ED        IV Access:    Peripheral IV   Procedures and diagnostic studies:   No results found.   Medical Consultants:    None.  Anti-Infectives:   None  Subjective:    Daquann Scruton no new complaints he denies any abdominal pain.  Objective:    Vitals:   02/09/17 0400 02/09/17 0424 02/09/17 0500 02/09/17 0600  BP: 128/73 135/74 140/78 140/77  Pulse: (!) 101 96 95 91  Resp: (!) 23 (!) 22 (!) 21  18  Temp:  98.3 F (36.8 C)    TempSrc:  Oral    SpO2: 99% 98% 94% 98%  Weight:      Height:        Intake/Output Summary (Last 24 hours) at 02/09/2017 0739 Last data filed at 02/09/2017 0600 Gross per 24 hour  Intake 1791.83 ml  Output -  Net 1791.83 ml   Filed Weights   02/09/17 0025  Weight: 79.1 kg (174 lb 6.1 oz)    Exam: General exam: In no acute distress. Respiratory system: Good air movement clear to auscultation.. Cardiovascular system: Positive S1-S2 regular rate and rhythm no JVD. Gastrointestinal system: Some mild epigastric tenderness. Central nervous system: Alert and oriented. No focal neurological deficits. Extremities: No lower extremity edema. Skin: No rashes, lesions or ulcers Psychiatry: Judgement and insight appear normal. Mood & affect appropriate.    Data Reviewed:    Labs: Basic Metabolic Panel: Recent Labs  Lab 02/08/17 2145 02/08/17 2240 02/09/17 0623  NA 134*  --  136  K 3.4*  --  3.7  CL 102  --  108  CO2 22  --  21*  GLUCOSE 95  --  89  BUN 9  --  8  CREATININE 0.76  --  0.74  CALCIUM 8.8*  --  7.8*  MG  --  2.3  --   PHOS  --  4.5  --    GFR Estimated Creatinine Clearance: 104.5 mL/min (by C-G formula based on SCr of 0.74 mg/dL). Liver Function Tests: Recent Labs  Lab 02/08/17 2145 02/09/17 0623  AST 215* 233*  ALT 93* 100*  ALKPHOS 104 86  BILITOT 0.4 0.8  PROT 7.0 6.1*  ALBUMIN 3.7 3.2*   Recent Labs  Lab 02/08/17 2240  LIPASE 58*   No results for input(s): AMMONIA in the last 168 hours. Coagulation profile Recent Labs  Lab 02/08/17 2240  INR 0.95    CBC: Recent Labs  Lab 02/08/17 2145 02/09/17 0623  WBC 6.6 4.9  NEUTROABS  --  3.1  HGB 6.7* 7.9*  HCT 20.8* 24.0*  MCV 79.1 80.5  PLT 157 146*   Cardiac Enzymes: No results for input(s): CKTOTAL, CKMB, CKMBINDEX, TROPONINI in the last 168 hours. BNP (last 3 results) No results for input(s): PROBNP in the last 8760 hours. CBG: No results for  input(s): GLUCAP in the last 168 hours. D-Dimer: No results for input(s): DDIMER in the last 72 hours. Hgb A1c: No results for input(s): HGBA1C in the last 72 hours. Lipid Profile: No results for input(s): CHOL, HDL, LDLCALC, TRIG, CHOLHDL, LDLDIRECT in the last 72 hours. Thyroid function studies: No results for input(s): TSH, T4TOTAL, T3FREE, THYROIDAB in the last 72 hours.  Invalid input(s): FREET3 Anemia work up: No results for input(s): VITAMINB12, FOLATE, FERRITIN, TIBC, IRON, RETICCTPCT in the last 72 hours. Sepsis Labs: Recent Labs  Lab 02/08/17 2145 02/09/17 0623  WBC 6.6 4.9   Microbiology Recent Results (from  the past 240 hour(s))  MRSA PCR Screening     Status: None   Collection Time: 02/09/17 12:25 AM  Result Value Ref Range Status   MRSA by PCR NEGATIVE NEGATIVE Final    Comment:        The GeneXpert MRSA Assay (FDA approved for NASAL specimens only), is one component of a comprehensive MRSA colonization surveillance program. It is not intended to diagnose MRSA infection nor to guide or monitor treatment for MRSA infections.      Medications:   . pantoprazole (PROTONIX) IV  40 mg Intravenous Q12H   Continuous Infusions:    LOS: 1 day   Charlynne Cousins  [facility] Pager [phone]  *Please refer to [email or web address], password [email or web address] to get updated schedule on who will round on this patient, as hospitalists switch teams weekly. If 7PM-7AM, please contact night-coverage at [email or web address], password [email or web address] for any overnight needs.  02/09/2017, 7:39 AM

## 2017-02-10 DIAGNOSIS — Z720 Tobacco use: Secondary | ICD-10-CM

## 2017-02-10 DIAGNOSIS — I10 Essential (primary) hypertension: Secondary | ICD-10-CM

## 2017-02-10 LAB — CBC WITH DIFFERENTIAL/PLATELET
Basophils Absolute: 0 10*3/uL (ref 0.0–0.1)
Basophils Relative: 1 %
EOS ABS: 0.2 10*3/uL (ref 0.0–0.7)
Eosinophils Relative: 3 %
HEMATOCRIT: 27.8 % — AB (ref 39.0–52.0)
HEMOGLOBIN: 8.8 g/dL — AB (ref 13.0–17.0)
LYMPHS ABS: 1.3 10*3/uL (ref 0.7–4.0)
LYMPHS PCT: 20 %
MCH: 25.7 pg — AB (ref 26.0–34.0)
MCHC: 31.7 g/dL (ref 30.0–36.0)
MCV: 81.3 fL (ref 78.0–100.0)
MONOS PCT: 13 %
Monocytes Absolute: 0.9 10*3/uL (ref 0.1–1.0)
NEUTROS ABS: 4.2 10*3/uL (ref 1.7–7.7)
NEUTROS PCT: 63 %
Platelets: 157 10*3/uL (ref 150–400)
RBC: 3.42 MIL/uL — ABNORMAL LOW (ref 4.22–5.81)
RDW: 17.1 % — ABNORMAL HIGH (ref 11.5–15.5)
WBC: 6.6 10*3/uL (ref 4.0–10.5)

## 2017-02-10 LAB — COMPREHENSIVE METABOLIC PANEL
ALK PHOS: 93 U/L (ref 38–126)
ALT: 93 U/L — ABNORMAL HIGH (ref 17–63)
ANION GAP: 7 (ref 5–15)
AST: 148 U/L — ABNORMAL HIGH (ref 15–41)
Albumin: 3.4 g/dL — ABNORMAL LOW (ref 3.5–5.0)
BILIRUBIN TOTAL: 1 mg/dL (ref 0.3–1.2)
BUN: 10 mg/dL (ref 6–20)
CALCIUM: 8.3 mg/dL — AB (ref 8.9–10.3)
CO2: 24 mmol/L (ref 22–32)
CREATININE: 0.71 mg/dL (ref 0.61–1.24)
Chloride: 102 mmol/L (ref 101–111)
GFR calc non Af Amer: >60 mL/min (ref >60)
Glucose, Bld: 93 mg/dL (ref 65–99)
Potassium: 3.6 mmol/L (ref 3.5–5.1)
Sodium: 133 mmol/L — ABNORMAL LOW (ref 135–145)
TOTAL PROTEIN: 6.6 g/dL (ref 6.5–8.1)

## 2017-02-10 LAB — TYPE AND SCREEN
ABO/RH(D): O POS
Antibody Screen: NEGATIVE
UNIT DIVISION: 0
Unit division: 0

## 2017-02-10 LAB — BPAM RBC
Blood Product Expiration Date: 201812122359
Blood Product Expiration Date: 201812122359
ISSUE DATE / TIME: 201811210208
ISSUE DATE / TIME: 201811202327
UNIT TYPE AND RH: 5100
Unit Type and Rh: 5100

## 2017-02-10 LAB — HEMOGLOBIN: Hemoglobin: 8.8 g/dL — ABNORMAL LOW (ref 13.0–17.0)

## 2017-02-10 MED ORDER — TRAZODONE HCL 50 MG PO TABS
50.0000 mg | ORAL_TABLET | Freq: Every day | ORAL | Status: DC
  Administered 2017-02-10 – 2017-02-11 (×2): 50 mg via ORAL
  Filled 2017-02-10 (×2): qty 1

## 2017-02-10 MED ORDER — HYDROXYZINE HCL 25 MG PO TABS
50.0000 mg | ORAL_TABLET | Freq: Three times a day (TID) | ORAL | Status: DC
  Administered 2017-02-10 – 2017-02-12 (×7): 50 mg via ORAL
  Filled 2017-02-10 (×7): qty 2

## 2017-02-10 MED ORDER — GABAPENTIN 300 MG PO CAPS
300.0000 mg | ORAL_CAPSULE | Freq: Three times a day (TID) | ORAL | Status: DC
  Administered 2017-02-10 – 2017-02-12 (×7): 300 mg via ORAL
  Filled 2017-02-10 (×7): qty 1

## 2017-02-10 NOTE — Progress Notes (Signed)
[facility] PROGRESS NOTE    Progress Note  Barry Moore  JKK:[id number] DOB: 1965-08-21 DOA: 02/08/2017 PCP: Patient, No Pcp Per     Brief Narrative:   Barry Moore is an 51 y.o. male past medical history a lot of all views and alcoholic hepatitis, essential hypertension seizure disorder, recently discharged from the hospital for upper GI bleed with an EGD on 11/03/2016 that showed duodenal ulcer without stigmata of bleeding, portal gastropathy and duodenitis, who is coming into the emergency room complaining of a multiple melanotic stools that started 3 days prior to admission with epigastric abdominal pain and postural  Assessment/Plan:   Lower GI bleed: Hemoglobin on 12/12/2016 was 10.5 on admission and EGD was 6.7 Status post one unit of packed red blood cells, Hbg stable 8.8 Recent EGD on 11/03/2016 that showed duodenal ulcer. Continue  IV Protonix. He related he never filled his Protonix when he was discharged on 11/03/2016.  Alcohol withdrawal (HCC) Continue Ativan protocol, the with oral thiamine and folate. He has no signs of withdrawl  Elevated LFTs: Ratio is 2-1, AST to ALT. Likely due to alcohol abuse. LFT's are improving. Billi of 0.8 and alkaline phosphatase is 88.  Seizure ([place]) No signs of withdrawals, he is currently not taking antiepileptic medication. Was not able to specify which medication he is taking.  Tobacco abuse He declined nicotine patch, he was counseled.  Essential hypertension Blood pressure seems to be stable continue to hold antihypertensive medication.   DVT prophylaxis: SCD's Family Communication:none Disposition Plan/Barrier to D/C: transfer to SDU Code Status:     Code Status Orders  (From admission, onward)        Start     Ordered   02/09/17 0107  Full code  Continuous     02/09/17 0106    Code Status History    Date Active Date Inactive Code Status Order ID Comments User Context   12/28/2016 18:01 12/30/2016  17:35 Full Code [id number]  Daleen Bo, MD ED   11/22/2016 23:06 11/23/2016 17:32 Full Code [id number]  Ward, [name], PA-C ED   11/18/2016 06:25 11/18/2016 14:25 Full Code [id number]  Orpah Greek, MD ED   11/01/2016 13:10 11/03/2016 21:30 Full Code [id number]  Merton Border, MD ED   11/01/2016 12:44 11/01/2016 13:10 Full Code [id number]  Merton Border, MD ED   10/15/2016 05:04 10/15/2016 19:02 Full Code [id number]  Street, [place], PA-C ED   08/19/2016 16:25 08/20/2016 13:59 Full Code [id number]  Kinnie Feil, PA-C ED   08/13/2016 15:57 08/14/2016 20:31 Full Code [id number]  Blanchie Dessert, MD ED   07/06/2016 12:00 07/12/2016 18:28 Full Code [id number]  Patrecia Pour, NP Inpatient   07/05/2016 22:30 07/06/2016 11:32 Full Code [id number]  Drenda Freeze, MD ED   06/15/2016 23:47 06/23/2016 18:54 Full Code [id number]  Laverle Hobby, PA-C Inpatient   06/14/2016 22:07 06/15/2016 23:12 Full Code [id number]  Tanna Furry, MD ED   05/26/2016 19:55 05/29/2016 15:47 Full Code [id number]  Ivor Costa, MD ED   05/26/2016 18:04 05/26/2016 19:55 Full Code [id number]  Renita Papa, [place] ED   03/05/2016 18:26 03/09/2016 21:12 Full Code [id number]  Benjamine Mola, [place] Inpatient   03/05/2016 18:26 03/05/2016 18:26 Full Code [id number]  Benjamine Mola, [place] Inpatient   03/04/2016 18:38 03/05/2016 16:26 Full Code [id number]  Aaron Edelman ED   02/14/2016 23:35 02/19/2016 15:34 Full Code [id number]  Edwin Dada, MD ED   02/14/2016 13:23 02/14/2016 23:35 Full Code [id number]  Lacretia Leigh, MD ED        IV Access:    Peripheral IV   Procedures and diagnostic studies:   No results found.   Medical Consultants:    None.  Anti-Infectives:   None  Subjective:    Barry Moore feels good,no new complains.  Objective:    Vitals:   02/10/17 0400 02/10/17 0440 02/10/17 0500 02/10/17 0600  BP:  (!) 159/92 (!) 149/84 (!) 143/74  Pulse: 72 86 91 82  Resp: 17 13 (!) 22 19  Temp:        TempSrc:      SpO2: 99% 95% 97% 99%  Weight:   78.2 kg (172 lb 6.4 oz)   Height:        Intake/Output Summary (Last 24 hours) at 02/10/2017 0721 Last data filed at 02/10/2017 0600 Gross per 24 hour  Intake 1220 ml  Output 2100 ml  Net -880 ml   Filed Weights   02/09/17 0025 02/10/17 0500  Weight: 79.1 kg (174 lb 6.1 oz) 78.2 kg (172 lb 6.4 oz)    Exam: General exam: In no acute distress. Respiratory system: Good air movement clear to auscultation.. Cardiovascular system: Positive S1-S2 regular rate and rhythm no JVD. Gastrointestinal system: Some mild epigastric tenderness, no rebound or guarding. Central nervous system: Alert and oriented. No focal neurological deficits. Extremities: No lower extremity edema. Skin: No rashes, lesions or ulcers Psychiatry: Judgement and insight appear normal. Mood & affect appropriate.    Data Reviewed:    Labs: Basic Metabolic Panel: Recent Labs  Lab 02/08/17 2145 02/08/17 2240 02/09/17 0623 02/10/17 0127  NA 134*  --  136 133*  K 3.4*  --  3.7 3.6  CL 102  --  108 102  CO2 22  --  21* 24  GLUCOSE 95  --  89 93  BUN 9  --  8 10  CREATININE 0.76  --  0.74 0.71  CALCIUM 8.8*  --  7.8* 8.3*  MG  --  2.3  --   --   PHOS  --  4.5  --   --    GFR Estimated Creatinine Clearance: 104.5 mL/min (by C-G formula based on SCr of 0.71 mg/dL). Liver Function Tests: Recent Labs  Lab 02/08/17 2145 02/09/17 0623 02/10/17 0127  AST 215* 233* 148*  ALT 93* 100* 93*  ALKPHOS 104 86 93  BILITOT 0.4 0.8 1.0  PROT 7.0 6.1* 6.6  ALBUMIN 3.7 3.2* 3.4*   Recent Labs  Lab 02/08/17 2240  LIPASE 58*   No results for input(s): AMMONIA in the last 168 hours. Coagulation profile Recent Labs  Lab 02/08/17 2240  INR 0.95    CBC: Recent Labs  Lab 02/08/17 2145 02/09/17 0623 02/09/17 1924 02/10/17 0127  WBC 6.6 4.9  --  6.6  NEUTROABS  --  3.1  --  4.2  HGB 6.7* 7.9* 8.8* 8.8*  8.8*  HCT 20.8* 24.0*  --  27.8*  MCV 79.1 80.5   --  81.3  PLT 157 146*  --  157   Cardiac Enzymes: No results for input(s): CKTOTAL, CKMB, CKMBINDEX, TROPONINI in the last 168 hours. BNP (last 3 results) No results for input(s): PROBNP in the last 8760 hours. CBG: No results for input(s): GLUCAP in the last 168 hours. D-Dimer: No results for input(s): DDIMER in the last 72 hours. Hgb A1c: No results for input(s): HGBA1C in the last 72 hours. Lipid Profile: No results for input(s): CHOL, HDL,  LDLCALC, TRIG, CHOLHDL, LDLDIRECT in the last 72 hours. Thyroid function studies: No results for input(s): TSH, T4TOTAL, T3FREE, THYROIDAB in the last 72 hours.  Invalid input(s): FREET3 Anemia work up: No results for input(s): VITAMINB12, FOLATE, FERRITIN, TIBC, IRON, RETICCTPCT in the last 72 hours. Sepsis Labs: Recent Labs  Lab 02/08/17 2145 02/09/17 0623 02/10/17 0127  WBC 6.6 4.9 6.6   Microbiology Recent Results (from the past 240 hour(s))  MRSA PCR Screening     Status: None   Collection Time: 02/09/17 12:25 AM  Result Value Ref Range Status   MRSA by PCR NEGATIVE NEGATIVE Final    Comment:        The GeneXpert MRSA Assay (FDA approved for NASAL specimens only), is one component of a comprehensive MRSA colonization surveillance program. It is not intended to diagnose MRSA infection nor to guide or monitor treatment for MRSA infections.      Medications:   . feeding supplement (ENSURE ENLIVE)  237 mL Oral BID BM  . multivitamin with minerals  1 tablet Oral Daily  . pantoprazole (PROTONIX) IV  40 mg Intravenous Q12H   Continuous Infusions:    LOS: 2 days   Charlynne Cousins  [facility] Pager [phone]  *Please refer to [email or web address], password [email or web address] to get updated schedule on who will round on this patient, as hospitalists switch teams weekly. If 7PM-7AM, please contact night-coverage at [email or web address], password [email or web address] for any overnight needs.  02/10/2017, 7:21 AM

## 2017-02-10 NOTE — Progress Notes (Signed)
Transferred to room 1512 via wheelchair. Report given to RN.

## 2017-02-11 DIAGNOSIS — R569 Unspecified convulsions: Secondary | ICD-10-CM

## 2017-02-11 LAB — CBC WITH DIFFERENTIAL/PLATELET
Basophils Absolute: 0 10*3/uL (ref 0.0–0.1)
Basophils Relative: 1 %
Eosinophils Absolute: 0.3 10*3/uL (ref 0.0–0.7)
Eosinophils Relative: 4 %
HCT: 29.4 % — ABNORMAL LOW (ref 39.0–52.0)
Hemoglobin: 9.4 g/dL — ABNORMAL LOW (ref 13.0–17.0)
Lymphocytes Relative: 20 %
Lymphs Abs: 1.5 10*3/uL (ref 0.7–4.0)
MCH: 26.3 pg (ref 26.0–34.0)
MCHC: 32 g/dL (ref 30.0–36.0)
MCV: 82.4 fL (ref 78.0–100.0)
Monocytes Absolute: 1.3 10*3/uL — ABNORMAL HIGH (ref 0.1–1.0)
Monocytes Relative: 18 %
Neutro Abs: 4.3 10*3/uL (ref 1.7–7.7)
Neutrophils Relative %: 57 %
Platelets: 209 10*3/uL (ref 150–400)
RBC: 3.57 MIL/uL — ABNORMAL LOW (ref 4.22–5.81)
RDW: 18.3 % — ABNORMAL HIGH (ref 11.5–15.5)
WBC: 7.4 10*3/uL (ref 4.0–10.5)

## 2017-02-11 LAB — COMPREHENSIVE METABOLIC PANEL
ALT: 139 U/L — ABNORMAL HIGH (ref 17–63)
AST: 241 U/L — ABNORMAL HIGH (ref 15–41)
Albumin: 3.5 g/dL (ref 3.5–5.0)
Alkaline Phosphatase: 108 U/L (ref 38–126)
Anion gap: 6 (ref 5–15)
BUN: 14 mg/dL (ref 6–20)
CO2: 28 mmol/L (ref 22–32)
Calcium: 9 mg/dL (ref 8.9–10.3)
Chloride: 103 mmol/L (ref 101–111)
Creatinine, Ser: 0.88 mg/dL (ref 0.61–1.24)
GFR calc Af Amer: >60 mL/min (ref >60)
GFR calc non Af Amer: >60 mL/min (ref >60)
Glucose, Bld: 104 mg/dL — ABNORMAL HIGH (ref 65–99)
Potassium: 4.2 mmol/L (ref 3.5–5.1)
Sodium: 137 mmol/L (ref 135–145)
Total Bilirubin: 0.6 mg/dL (ref 0.3–1.2)
Total Protein: 6.7 g/dL (ref 6.5–8.1)

## 2017-02-11 LAB — GLUCOSE, CAPILLARY: Glucose-Capillary: 127 mg/dL — ABNORMAL HIGH (ref 65–99)

## 2017-02-11 MED ORDER — PANTOPRAZOLE SODIUM 40 MG PO TBEC: 40.0000 mg | DELAYED_RELEASE_TABLET | Freq: Two times a day (BID) | ORAL | 3 refills | Status: DC

## 2017-02-11 MED ORDER — PANTOPRAZOLE SODIUM 40 MG PO TBEC: 40.0000 mg | DELAYED_RELEASE_TABLET | Freq: Every day | ORAL | Status: DC

## 2017-02-11 MED ORDER — PANTOPRAZOLE SODIUM 40 MG PO TBEC
40.0000 mg | DELAYED_RELEASE_TABLET | Freq: Two times a day (BID) | ORAL | Status: DC
  Administered 2017-02-11 – 2017-02-12 (×3): 40 mg via ORAL
  Filled 2017-02-11 (×4): qty 1

## 2017-02-11 MED FILL — PANTOPRAZOLE SOD DR 40 MG T: 40 | 30 days supply | Qty: 60 | Fill #0

## 2017-02-11 NOTE — Progress Notes (Addendum)
[facility] PROGRESS NOTE    Progress Note  Graham Hyun  CBJ:[id number] DOB: 1965/10/13 DOA: 02/08/2017 PCP: Patient, No Pcp Per     Brief Narrative:   Barry Moore is an 51 y.o. male past medical history a lot of all views and alcoholic hepatitis, essential hypertension seizure disorder, recently discharged from the hospital for upper GI bleed with an EGD on 11/03/2016 that showed duodenal ulcer without stigmata of bleeding, portal gastropathy and duodenitis, who is coming into the emergency room complaining of a multiple melanotic stools that started 3 days prior to admission with epigastric abdominal pain and postural  Assessment/Plan:   Upper GI bleed: Hemoglobin has remained stable ranging from 9.4-8.8. He's had no further melanotic stools. Recent EGD on 11/03/2016 that showed duodenal ulcer. Continue  IV Protonix. He related he never filled his Protonix when he was discharged on 11/03/2016. He relates some nausea but no vomiting occasional epiGastric pain.  Alcohol withdrawal (HCC) Continue Ativan protocol, the with oral thiamine and folate. He has no signs of withdrawl  Elevated LFTs: Ratio is 2-1, AST to ALT. Likely due to alcohol abuse. LFT's are improving. Billi of 0.8 and alkaline phosphatase is 88.  Seizure ([place]) No signs of withdrawals, he is currently not taking antiepileptic medication. Was not able to specify which medication he is taking.  Tobacco abuse He declined nicotine patch, he was counseled.  Essential hypertension Blood pressure seems to be stable continue to hold antihypertensive medication.   DVT prophylaxis: SCD's Family Communication:none Disposition Plan/Barrier to D/C: Home in am Code Status:     Code Status Orders  (From admission, onward)        Start     Ordered   02/09/17 0107  Full code  Continuous     02/09/17 0106    Code Status History    Date Active Date Inactive Code Status Order ID Comments User Context   12/28/2016 18:01 12/30/2016 17:35 Full Code [id number]  Daleen Bo, MD ED   11/22/2016 23:06 11/23/2016 17:32 Full Code [id number]  Ward, [name], PA-C ED   11/18/2016 06:25 11/18/2016 14:25 Full Code [id number]  Orpah Greek, MD ED   11/01/2016 13:10 11/03/2016 21:30 Full Code [id number]  Merton Border, MD ED   11/01/2016 12:44 11/01/2016 13:10 Full Code [id number]  Merton Border, MD ED   10/15/2016 05:04 10/15/2016 19:02 Full Code [id number]  Street, [place], PA-C ED   08/19/2016 16:25 08/20/2016 13:59 Full Code [id number]  Kinnie Feil, PA-C ED   08/13/2016 15:57 08/14/2016 20:31 Full Code [id number]  Blanchie Dessert, MD ED   07/06/2016 12:00 07/12/2016 18:28 Full Code [id number]  Patrecia Pour, NP Inpatient   07/05/2016 22:30 07/06/2016 11:32 Full Code [id number]  Drenda Freeze, MD ED   06/15/2016 23:47 06/23/2016 18:54 Full Code [id number]  Laverle Hobby, PA-C Inpatient   06/14/2016 22:07 06/15/2016 23:12 Full Code [id number]  Tanna Furry, MD ED   05/26/2016 19:55 05/29/2016 15:47 Full Code [id number]  Ivor Costa, MD ED   05/26/2016 18:04 05/26/2016 19:55 Full Code [id number]  Renita Papa, [place] ED   03/05/2016 18:26 03/09/2016 21:12 Full Code [id number]  Benjamine Mola, [place] Inpatient   03/05/2016 18:26 03/05/2016 18:26 Full Code [id number]  Benjamine Mola, [place] Inpatient   03/04/2016 18:38 03/05/2016 16:26 Full Code [id number]  Aaron Edelman ED   02/14/2016 23:35 02/19/2016 15:34 Full Code [id number]  Edwin Dada, MD ED   02/14/2016 13:23 02/14/2016 23:35 Full Code [id number]  Lacretia Leigh, MD ED        IV Access:    Peripheral IV   Procedures and diagnostic studies:   No results found.   Medical Consultants:    None.  Anti-Infectives:   None  Subjective:    [name] feels good,no new complains.  Objective:    Vitals:   02/10/17 1300 02/10/17 1610 02/10/17 2033 02/11/17 0515  BP: 127/85 126/82 (!) 131/95 127/84  Pulse: 100 97 82 95  Resp: (!)  21 (!) 21 20 20   Temp: 98 F (36.7 C) 98.3 F (36.8 C) 98.9 F (37.2 C) 98.6 F (37 C)  TempSrc: Oral Oral Oral Oral  SpO2: 96% 100% 99% 100%  Weight:      Height:        Intake/Output Summary (Last 24 hours) at 02/11/2017 0817 Last data filed at 02/10/2017 1759 Gross per 24 hour  Intake 480 ml  Output -  Net 480 ml   Filed Weights   02/09/17 0025 02/10/17 0500  Weight: 79.1 kg (174 lb 6.1 oz) 78.2 kg (172 lb 6.4 oz)    Exam: General exam: In no acute distress. Respiratory system: Good air movement clear to auscultation.. Cardiovascular system: Positive S1-S2 regular rate and rhythm no JVD. Gastrointestinal system: Some mild epigastric tenderness, no rebound or guarding. Central nervous system: Alert and oriented. No focal neurological deficits. Extremities: No lower extremity edema. Skin: No rashes, lesions or ulcers Psychiatry: Judgement and insight appear normal. Mood & affect appropriate.    Data Reviewed:    Labs: Basic Metabolic Panel: Recent Labs  Lab 02/08/17 2145 02/08/17 2240 02/09/17 0623 02/10/17 0127 02/11/17 0505  NA 134*  --  136 133* 137  K 3.4*  --  3.7 3.6 4.2  CL 102  --  108 102 103  CO2 22  --  21* 24 28  GLUCOSE 95  --  89 93 104*  BUN 9  --  8 10 14   CREATININE 0.76  --  0.74 0.71 0.88  CALCIUM 8.8*  --  7.8* 8.3* 9.0  MG  --  2.3  --   --   --   PHOS  --  4.5  --   --   --    GFR Estimated Creatinine Clearance: 95 mL/min (by C-G formula based on SCr of 0.88 mg/dL). Liver Function Tests: Recent Labs  Lab 02/08/17 2145 02/09/17 0623 02/10/17 0127 02/11/17 0505  AST 215* 233* 148* 241*  ALT 93* 100* 93* 139*  ALKPHOS 104 86 93 108  BILITOT 0.4 0.8 1.0 0.6  PROT 7.0 6.1* 6.6 6.7  ALBUMIN 3.7 3.2* 3.4* 3.5   Recent Labs  Lab 02/08/17 2240  LIPASE 58*   No results for input(s): AMMONIA in the last 168 hours. Coagulation profile Recent Labs  Lab 02/08/17 2240  INR 0.95    CBC: Recent Labs  Lab 02/08/17 2145  02/09/17 0623 02/09/17 1924 02/10/17 0127 02/11/17 0505  WBC 6.6 4.9  --  6.6 7.4  NEUTROABS  --  3.1  --  4.2 4.3  HGB 6.7* 7.9* 8.8* 8.8*  8.8* 9.4*  HCT 20.8* 24.0*  --  27.8* 29.4*  MCV 79.1 80.5  --  81.3 82.4  PLT 157 146*  --  157 209   Cardiac Enzymes: No results for input(s): CKTOTAL, CKMB, CKMBINDEX, TROPONINI in the last 168 hours. BNP (last 3 results) No results for input(s): PROBNP in the last 8760 hours. CBG: Recent Labs  Lab 02/11/17  0006  GLUCAP 127*   D-Dimer: No results for input(s): DDIMER in the last 72 hours. Hgb A1c: No results for input(s): HGBA1C in the last 72 hours. Lipid Profile: No results for input(s): CHOL, HDL, LDLCALC, TRIG, CHOLHDL, LDLDIRECT in the last 72 hours. Thyroid function studies: No results for input(s): TSH, T4TOTAL, T3FREE, THYROIDAB in the last 72 hours.  Invalid input(s): FREET3 Anemia work up: No results for input(s): VITAMINB12, FOLATE, FERRITIN, TIBC, IRON, RETICCTPCT in the last 72 hours. Sepsis Labs: Recent Labs  Lab 02/08/17 2145 02/09/17 0623 02/10/17 0127 02/11/17 0505  WBC 6.6 4.9 6.6 7.4   Microbiology Recent Results (from the past 240 hour(s))  MRSA PCR Screening     Status: None   Collection Time: 02/09/17 12:25 AM  Result Value Ref Range Status   MRSA by PCR NEGATIVE NEGATIVE Final    Comment:        The GeneXpert MRSA Assay (FDA approved for NASAL specimens only), is one component of a comprehensive MRSA colonization surveillance program. It is not intended to diagnose MRSA infection nor to guide or monitor treatment for MRSA infections.      Medications:   . feeding supplement (ENSURE ENLIVE)  237 mL Oral BID BM  . gabapentin  300 mg Oral TID  . hydrOXYzine  50 mg Oral TID  . multivitamin with minerals  1 tablet Oral Daily  . pantoprazole (PROTONIX) IV  40 mg Intravenous Q12H  . traZODone  50 mg Oral QHS   Continuous Infusions:    LOS: 3 days   Charlynne Cousins  [facility] Pager [phone]  *Please refer to [email or web address], password [email or web address] to get updated schedule on who will round on this patient, as hospitalists switch teams weekly. If 7PM-7AM, please contact night-coverage at [email or web address], password [email or web address] for any overnight needs.  02/11/2017, 8:17 AM

## 2017-02-12 LAB — CBC
HCT: 30 % — ABNORMAL LOW (ref 39.0–52.0)
Hemoglobin: 9.5 g/dL — ABNORMAL LOW (ref 13.0–17.0)
MCH: 25.9 pg — AB (ref 26.0–34.0)
MCHC: 31.7 g/dL (ref 30.0–36.0)
MCV: 81.7 fL (ref 78.0–100.0)
PLATELETS: 258 10*3/uL (ref 150–400)
RBC: 3.67 MIL/uL — ABNORMAL LOW (ref 4.22–5.81)
RDW: 18.3 % — ABNORMAL HIGH (ref 11.5–15.5)
WBC: 7.6 10*3/uL (ref 4.0–10.5)

## 2017-02-12 NOTE — Progress Notes (Signed)
Pt is discharged to home. DC instructions given. No concerns voiced. Prescription x 1 given for protonix. Bus pass also given to pt. Pt left unit ambulatory and in stable condition. Received AM meds prior to DC. Hale Bogus.

## 2017-02-12 NOTE — Clinical Social Work Note (Signed)
CSW notified by nursing to provide a bus pass for patient.  Pass given to nurse for patient who is facilitating d/c.  No further SW intervention indicated.  SW signing off.  Lorie Phenix. Pauline Good, [place]  (weekend coverage)

## 2017-02-12 NOTE — Discharge Summary (Addendum)
Physician Discharge Summary  Barry Moore RWE:[id number] DOB: 02-Nov-1965 DOA: 02/08/2017  PCP: Patient, No Pcp Per  Admit date: 02/08/2017 Discharge date: 02/21/2017  Admitted From: home Disposition:  Home  Recommendations for Outpatient Follow-up:  1. Follow up with PCP in 1-2 weeks   Home Health:no Equipment/Devices:None  Discharge Condition:stable CODE STATUS:DNR Diet recommendation: Heart Healthy  Brief/Interim Summary: 51 y.o. male past medical history a lot of all views and alcoholic hepatitis, essential hypertension seizure disorder, recently discharged from the hospital for upper GI bleed with an EGD on 11/03/2016 that showed duodenal ulcer without stigmata of bleeding, portal gastropathy and duodenitis, who is coming into the emergency room complaining of a multiple melanotic stools that started 3 days prior to admission with epigastric abdominal pain and postural    Discharge Diagnoses:  Principal Problem:   UGI bleed Active Problems:   Seizure ([place])   Tobacco abuse   Essential hypertension   Alcohol withdrawal ([place])   Acute blood loss anemia  Upper GI bleed/acute blood loss anemia: The patient relates he did not refill his medication, on 11/03/2016 he had an EGD that showed duodenal ulcer.  Since discharge from the hospital he has not taking his Protonix but he started having melanotic stools the day prior to admission. He was started on IV Protonix, on admission his hemoglobin was 6.7, after unit of packed red blood cells his hemoglobin came up to 8 and has been stable since then. We will talk to the case manager to have obtained his Protonix which she will continue to take for 6 weeks follow-up with his GI appointment.  Alcohol abuse: He was started on the Ativan protocol thiamine and folate he showed no signs of withdrawals. This is definitely playing a a role in him being noncompliant with his medication.  Next  Related LFTs: In the ratio of 2-1 AST to ALT  bilirubin and alkaline phosphatase are below the course of, this is likely due to alcohol abuse repeated LFTs are trending down.  Seizure disorder: No  signs of withdrawals he is currently not taking any antiepileptic medications no events during hospital stay.  Tobacco abuse: He declined nicotine patch.  Essential hypertension: No changes were made. Discharge Instructions  Discharge Instructions    Diet - low sodium heart healthy   Complete by:  As directed    Increase activity slowly   Complete by:  As directed      Allergies as of 02/12/2017      Reactions   Aspirin Nausea And Vomiting      Medication List    STOP taking these medications   azithromycin 250 MG tablet Commonly known as:  ZITHROMAX   HYDROcodone-acetaminophen 5-325 MG tablet Commonly known as:  NORCO/VICODIN   omeprazole 20 MG capsule Commonly known as:  PRILOSEC     TAKE these medications   acetaminophen 500 MG tablet Commonly known as:  TYLENOL Take 1,000 mg by mouth every 6 (six) hours as needed for headache. What changed:  Another medication with the same name was removed. Continue taking this medication, and follow the directions you see here.   gabapentin 300 MG capsule Commonly known as:  NEURONTIN Take 1 capsule (300 mg total) by mouth 3 (three) times daily.   hydrOXYzine 50 MG tablet Commonly known as:  ATARAX/VISTARIL Take 1 tablet (50 mg total) by mouth 3 (three) times daily.   pantoprazole 40 MG tablet Commonly known as:  PROTONIX Take 1 tablet (40 mg total) by mouth 2 (two)  times daily.   traZODone 50 MG tablet Commonly known as:  DESYREL Take 1 tablet (50 mg total) by mouth at bedtime.      Follow-up [facility] Follow up.   Why:  please call to arrange follow appointment Contact information: [place] [id number] [phone]         Allergies  Allergen Reactions  . Aspirin Nausea  And Vomiting    Consultations:  None   Procedures/Studies: No results found.   Subjective: No complaints feels great no abdominal pain.  Discharge Exam: Vitals:   02/11/17 2030 02/12/17 0606  BP: (!) 145/89 126/74  Pulse: 83 91  Resp: 18 18  Temp: 98.3 F (36.8 C) 98.4 F (36.9 C)  SpO2: 99% 98%   Vitals:   02/11/17 0515 02/11/17 1429 02/11/17 2030 02/12/17 0606  BP: 127/84 135/82 (!) 145/89 126/74  Pulse: 95 93 83 91  Resp: 20 20 18 18   Temp: 98.6 F (37 C) 99.7 F (37.6 C) 98.3 F (36.8 C) 98.4 F (36.9 C)  TempSrc: Oral Oral Oral Oral  SpO2: 100% 98% 99% 98%  Weight:      Height:        General: Pt is alert, awake, not in acute distress Cardiovascular: RRR, S1/S2 +, no rubs, no gallops Respiratory: CTA bilaterally, no wheezing, no rhonchi Abdominal: Soft, NT, ND, bowel sounds + Extremities: no edema, no cyanosis    The results of significant diagnostics from this hospitalization (including imaging, microbiology, ancillary and laboratory) are listed below for reference.     Microbiology: No results found for this or any previous visit (from the past 240 hour(s)).   Labs: BNP (last 3 results) No results for input(s): BNP in the last 8760 hours. Basic Metabolic Panel: No results for input(s): NA, K, CL, CO2, GLUCOSE, BUN, CREATININE, CALCIUM, MG, PHOS in the last 168 hours. Liver Function Tests: No results for input(s): AST, ALT, ALKPHOS, BILITOT, PROT, ALBUMIN in the last 168 hours. No results for input(s): LIPASE, AMYLASE in the last 168 hours. No results for input(s): AMMONIA in the last 168 hours. CBC: No results for input(s): WBC, NEUTROABS, HGB, HCT, MCV, PLT in the last 168 hours. Cardiac Enzymes: No results for input(s): CKTOTAL, CKMB, CKMBINDEX, TROPONINI in the last 168 hours. BNP: Invalid input(s): POCBNP CBG: No results for input(s): GLUCAP in the last 168 hours. D-Dimer No results for input(s): DDIMER in the last 72 hours. Hgb  A1c No results for input(s): HGBA1C in the last 72 hours. Lipid Profile No results for input(s): CHOL, HDL, LDLCALC, TRIG, CHOLHDL, LDLDIRECT in the last 72 hours. Thyroid function studies No results for input(s): TSH, T4TOTAL, T3FREE, THYROIDAB in the last 72 hours.  Invalid input(s): FREET3 Anemia work up No results for input(s): VITAMINB12, FOLATE, FERRITIN, TIBC, IRON, RETICCTPCT in the last 72 hours. Urinalysis    Component Value Date/Time   COLORURINE STRAW (A) 12/04/2016 2059   APPEARANCEUR CLEAR 12/04/2016 2059   LABSPEC 1.002 (L) 12/04/2016 2059   PHURINE 6.0 12/04/2016 2059   GLUCOSEU NEGATIVE 12/04/2016 2059   HGBUR NEGATIVE 12/04/2016 2059   BILIRUBINUR NEGATIVE 12/04/2016 2059   [place] NEGATIVE 12/04/2016 2059   PROTEINUR NEGATIVE 12/04/2016 2059   NITRITE NEGATIVE 12/04/2016 2059   LEUKOCYTESUR NEGATIVE 12/04/2016 2059   Sepsis Labs Invalid input(s): PROCALCITONIN,  WBC,  LACTICIDVEN Microbiology No results found for this or any previous visit (from the past 240 hour(s)).  Time coordinating discharge: Over 30 minutes  SIGNED:   Charlynne Cousins, MD  [facility] 02/21/2017, 1:17 PM Pager   If 7PM-7AM, please contact night-coverage [email or web address] Password [email or web address]

## 2017-02-21 DIAGNOSIS — D500 Iron deficiency anemia secondary to blood loss (chronic): Secondary | ICD-10-CM

## 2017-02-21 DIAGNOSIS — D62 Acute posthemorrhagic anemia: Secondary | ICD-10-CM

## 2017-02-28 ENCOUNTER — Emergency Department (HOSPITAL_COMMUNITY)
Admission: EM | Admit: 2017-02-28 | Discharge: 2017-02-28 | Disposition: A | Discharge status: Home / Self Care | Payer: Self-pay | Attending: Emergency Medicine | Admitting: Emergency Medicine

## 2017-02-28 ENCOUNTER — Encounter (HOSPITAL_COMMUNITY): Payer: Self-pay

## 2017-02-28 ENCOUNTER — Emergency Department (HOSPITAL_COMMUNITY): Payer: Self-pay

## 2017-02-28 ENCOUNTER — Other Ambulatory Visit: Payer: Self-pay

## 2017-02-28 DIAGNOSIS — F17210 Nicotine dependence, cigarettes, uncomplicated: Secondary | ICD-10-CM | POA: Insufficient documentation

## 2017-02-28 DIAGNOSIS — F10920 Alcohol use, unspecified with intoxication, uncomplicated: Secondary | ICD-10-CM | POA: Insufficient documentation

## 2017-02-28 DIAGNOSIS — R42 Dizziness and giddiness: Secondary | ICD-10-CM | POA: Insufficient documentation

## 2017-02-28 DIAGNOSIS — Y939 Activity, unspecified: Secondary | ICD-10-CM | POA: Insufficient documentation

## 2017-02-28 DIAGNOSIS — I10 Essential (primary) hypertension: Secondary | ICD-10-CM | POA: Insufficient documentation

## 2017-02-28 DIAGNOSIS — Z79899 Other long term (current) drug therapy: Secondary | ICD-10-CM | POA: Insufficient documentation

## 2017-02-28 DIAGNOSIS — D649 Anemia, unspecified: Secondary | ICD-10-CM | POA: Insufficient documentation

## 2017-02-28 DIAGNOSIS — Y999 Unspecified external cause status: Secondary | ICD-10-CM | POA: Insufficient documentation

## 2017-02-28 DIAGNOSIS — W19XXXA Unspecified fall, initial encounter: Secondary | ICD-10-CM | POA: Insufficient documentation

## 2017-02-28 DIAGNOSIS — S0990XA Unspecified injury of head, initial encounter: Secondary | ICD-10-CM | POA: Insufficient documentation

## 2017-02-28 DIAGNOSIS — Y929 Unspecified place or not applicable: Secondary | ICD-10-CM | POA: Insufficient documentation

## 2017-02-28 LAB — BASIC METABOLIC PANEL
ANION GAP: 9 (ref 5–15)
BUN: 7 mg/dL (ref 6–20)
CHLORIDE: 105 mmol/L (ref 101–111)
CO2: 22 mmol/L (ref 22–32)
Calcium: 9.2 mg/dL (ref 8.9–10.3)
Creatinine, Ser: 0.87 mg/dL (ref 0.61–1.24)
GFR calc non Af Amer: >60 mL/min (ref >60)
Glucose, Bld: 101 mg/dL — ABNORMAL HIGH (ref 65–99)
Potassium: 3.6 mmol/L (ref 3.5–5.1)
Sodium: 136 mmol/L (ref 135–145)

## 2017-02-28 LAB — CBC
HEMATOCRIT: 28.8 % — AB (ref 39.0–52.0)
HEMOGLOBIN: 8.8 g/dL — AB (ref 13.0–17.0)
MCH: 24 pg — AB (ref 26.0–34.0)
MCHC: 30.6 g/dL (ref 30.0–36.0)
MCV: 78.7 fL (ref 78.0–100.0)
Platelets: 198 10*3/uL (ref 150–400)
RBC: 3.66 MIL/uL — AB (ref 4.22–5.81)
RDW: 18.7 % — ABNORMAL HIGH (ref 11.5–15.5)
WBC: 6.4 10*3/uL (ref 4.0–10.5)

## 2017-02-28 LAB — URINALYSIS, ROUTINE W REFLEX MICROSCOPIC
Bilirubin Urine: NEGATIVE
GLUCOSE, UA: NEGATIVE mg/dL
HGB URINE DIPSTICK: NEGATIVE
Ketones, ur: NEGATIVE mg/dL
Leukocytes, UA: NEGATIVE
Nitrite: NEGATIVE
Protein, ur: NEGATIVE mg/dL
SPECIFIC GRAVITY, URINE: 1.002 — AB (ref 1.005–1.030)
pH: 6 (ref 5.0–8.0)

## 2017-02-28 LAB — ETHANOL: ALCOHOL ETHYL (B): 329 mg/dL — AB (ref <10)

## 2017-02-28 MED ORDER — FERROUS SULFATE 325 (65 FE) MG PO TABS: 325.0000 mg | ORAL_TABLET | Freq: Every day | ORAL | 0 refills | Status: DC

## 2017-02-28 MED ORDER — DOCUSATE SODIUM 100 MG PO CAPS: 100.0000 mg | ORAL_CAPSULE | Freq: Every day | ORAL | 0 refills | Status: DC

## 2017-02-28 MED ORDER — VITAMIN B-1 100 MG PO TABS
100.0000 mg | ORAL_TABLET | Freq: Once | ORAL | Status: AC
  Administered 2017-02-28: 100 mg via ORAL
  Filled 2017-02-28: qty 1

## 2017-02-28 NOTE — ED Triage Notes (Signed)
Per EMS, pt endorses dizziness x 2 weeks, pt state that he fell 2 days ago and that dizziness "is normal for me" pt admits to heavy use of etoh today. VS 138/96, Pulse 92, CBG 125, RR 18, spo2 98% on RA. Pt ambulatory.

## 2017-02-28 NOTE — ED Notes (Signed)
Pt states he drank an unknown amount of beer before coming to hospital.

## 2017-02-28 NOTE — ED Notes (Signed)
Pt very upset regarding d/c, pt angry we are not keeping him d/t his homelessness.  Pt steady on feet, dressed himself and ambulated from room, [facility] offered to call [facility] for patient, pt angrily states to staff and [employer] he would rather sleep in [place] than go to [facility].  Steady d/c from department with [employer]

## 2017-02-28 NOTE — ED Provider Notes (Signed)
[facility] EMERGENCY DEPARTMENT Provider Note   CSN: [id number] Arrival date & time: 02/28/17  1936     History   Chief Complaint Chief Complaint  Patient presents with  . Dizziness    HPI Barry Moore is a 51 y.o. male.  51yo M w/ PMH including alcohol abuse, homelessness, GI bleed who presents with head injury and dizziness.  Patient states he called EMS for hitting the back of his head when he fell 2 days ago.  He states that he has had dizziness for 2 weeks but stated "this is normal for me."  He admits to heavy alcohol use, at least 3 or 4 40s per day.  He admits to being homeless.   The history is provided by the patient.  Dizziness    Past Medical History:  Diagnosis Date  . Alcoholic hepatitis   . Depression   . ETOH abuse   . Hypertension   . Seizures [facility])     Patient Active Problem List   Diagnosis Date Noted  . Acute blood loss anemia 02/21/2017  . UGI bleed 02/08/2017  . Alcohol withdrawal ([place]) 02/08/2017  . Cocaine abuse ([place]) 11/18/2016  . GI bleed 11/01/2016  . Alcohol abuse   . Major depressive disorder, recurrent severe without psychotic features ([place]) 07/06/2016  . 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 Seizure ([place]) 05/26/2016  . Tobacco abuse 05/26/2016  . Homeless 05/26/2016  . Essential hypertension 05/26/2016  . Alcohol dependence with alcohol-induced mood disorder ([place]) 02/14/2016  . Severe alcohol withdrawal without perceptual disturbances without complication ([place]) 67/89/3810  . Alcoholic hepatitis without ascites 02/14/2016  . Thrombocytopenia ([place]) 02/14/2016    Past Surgical History:  Procedure Laterality Date  . ESOPHAGOGASTRODUODENOSCOPY (EGD) WITH PROPOFOL N/A 11/02/2016   Procedure: ESOPHAGOGASTRODUODENOSCOPY (EGD) WITH PROPOFOL;  Surgeon: Carol Ada, MD;  Location: [facility] ENDOSCOPY;  Service: Endoscopy;  Laterality: N/A;  . HERNIA REPAIR    . LEG SURGERY         Home Medications     Prior to Admission medications   Medication Sig Start Date End Date Taking? Authorizing Provider  acetaminophen (TYLENOL) 500 MG tablet Take 1,000 mg by mouth every 6 (six) hours as needed (for headaches).    Yes [provider]  pantoprazole (PROTONIX) 40 MG tablet Take 1 tablet (40 mg total) by mouth 2 (two) times daily. 02/11/17  Yes Charlynne Cousins, MD  docusate sodium (COLACE) 100 MG capsule Take 1 capsule (100 mg total) by mouth daily. 02/28/17   Little, Wenda Overland, MD  ferrous sulfate 325 (65 FE) MG tablet Take 1 tablet (325 mg total) by mouth daily. 02/28/17   Little, Wenda Overland, MD  gabapentin (NEURONTIN) 300 MG capsule Take 1 capsule (300 mg total) by mouth 3 (three) times daily. Patient not taking: Reported on 02/28/2017 12/30/16   Ethelene Hal, NP  hydrOXYzine (ATARAX/VISTARIL) 50 MG tablet Take 1 tablet (50 mg total) by mouth 3 (three) times daily. Patient not taking: Reported on 02/28/2017 12/30/16   Ethelene Hal, NP  traZODone (DESYREL) 50 MG tablet Take 1 tablet (50 mg total) by mouth at bedtime. Patient not taking: Reported on 02/28/2017 12/30/16   Ethelene Hal, NP    Family History Family History  Problem Relation Age of Onset  . Diabetes Mother   . Alcoholism Mother   . Emphysema Father   . Lung cancer Father   . Alcoholism Father     Social History Social History  Tobacco Use  . Smoking status: Current Every Day Smoker    Packs/day: 1.00    Types: Cigarettes  . Smokeless tobacco: Never Used  Substance Use Topics  . Alcohol use: Yes    Comment: BAC on admission was 386  . Drug use: Yes    Frequency: 3.0 times per week    Types: Cocaine    Comment: last use today 02/28/17     Allergies   Tomato and Aspirin   Review of Systems Review of Systems  Neurological: Positive for dizziness.   All other systems reviewed and are negative except that which was mentioned in HPI   Physical Exam Updated Vital  Signs BP 119/60   Pulse (!) 104   Temp 97.9 F (36.6 C) (Oral)   Resp 18   Ht 5\' 8"  (1.727 m)   Wt 78 kg (172 lb)   SpO2 97%   BMI 26.15 kg/m   Physical Exam  Constitutional: He is oriented to person, place, and time. He appears well-developed and well-nourished. No distress.  Sleeping comfortably  HENT:  Head: Normocephalic.  Moist mucous membranes Abrasion posterior scalp  Eyes: Conjunctivae are normal.  Neck: Neck supple.  Cardiovascular: Normal rate, regular rhythm and normal heart sounds.  No murmur heard. Pulmonary/Chest: Effort normal and breath sounds normal.  Abdominal: Soft. Bowel sounds are normal. He exhibits no distension. There is no tenderness.  Musculoskeletal: He exhibits no edema.  Neurological: He is alert and oriented to person, place, and time. He exhibits normal muscle tone.  Slow, fluent speech  Skin: Skin is warm and dry.  Psychiatric:  Disheveled, flat affect  Nursing note and vitals reviewed.    ED Treatments / Results  Labs (all labs ordered are listed, but only abnormal results are displayed) Labs Reviewed  BASIC METABOLIC PANEL - Abnormal; Notable for the following components:      Result Value   Glucose, Bld 101 (*)    All other components within normal limits  CBC - Abnormal; Notable for the following components:   RBC 3.66 (*)    Hemoglobin 8.8 (*)    HCT 28.8 (*)    MCH 24.0 (*)    RDW 18.7 (*)    All other components within normal limits  URINALYSIS, ROUTINE W REFLEX MICROSCOPIC - Abnormal; Notable for the following components:   Color, Urine COLORLESS (*)    Specific Gravity, Urine 1.002 (*)    All other components within normal limits  ETHANOL - Abnormal; Notable for the following components:   Alcohol, Ethyl (B) 329 (*)    All other components within normal limits    EKG  EKG Interpretation  Date/Time:  Monday February 28 2017 19:41:18 EST Ventricular Rate:  102 PR Interval:  136 QRS Duration: 92 QT  Interval:  332 QTC Calculation: 432 R Axis:   80 Text Interpretation:  Sinus tachycardia Otherwise normal ECG tachycardia new,  otherwise similar to previous Confirmed by Theotis Burrow [id number]) on 02/28/2017 9:22:36 PM       Radiology Ct Head Wo Contrast  Result Date: 02/28/2017 CLINICAL DATA:  Dizziness for 2 weeks EXAM: CT HEAD WITHOUT CONTRAST TECHNIQUE: Contiguous axial images were obtained from the base of the skull through the vertex without intravenous contrast. COMPARISON:  11/22/2016 FINDINGS: Brain: Very mild atrophic changes are noted. No findings to suggest acute hemorrhage, acute infarction or space-occupying mass lesion are noted. Vascular: No hyperdense vessel or unexpected calcification. Skull: Normal. Negative for fracture or focal lesion. Sinuses/Orbits: No  acute finding. Other: None. IMPRESSION: Mild atrophy.  No acute abnormality noted. Electronically Signed   By: Inez Catalina M.D.   On: 02/28/2017 20:12    Procedures Procedures (including critical care time)  Medications Ordered in ED Medications  thiamine (VITAMIN B-1) tablet 100 mg (100 mg Oral Given 02/28/17 2143)     Initial Impression / Assessment and Plan / ED Course  I have reviewed the triage vital signs and the nursing notes.  Pertinent labs & imaging results that were available during my care of the patient were reviewed by me and considered in my medical decision making (see chart for details).    Pt here w/ alcohol intoxication, hit head recently.  Head CT negative acute, vital signs stable, afebrile.  His hemoglobin is 8.8 which is stable from previous.  I did remind him of his anemia and need supplementation.  He was not demonstrating any signs of acute alcohol withdrawal, able to ambulate and tolerating PO.  Patient discharged in satisfactory condition.  Final Clinical Impressions(s) / ED Diagnoses   Final diagnoses:  Minor head injury, initial encounter  Dizziness  Alcoholic intoxication  without complication (HCC)  Anemia, unspecified type    ED Discharge Orders        Ordered    ferrous sulfate 325 (65 FE) MG tablet  Daily     02/28/17 2249    docusate sodium (COLACE) 100 MG capsule  Daily     02/28/17 2249       Little, Wenda Overland, MD 02/28/17 2330

## 2017-03-08 ENCOUNTER — Encounter (HOSPITAL_COMMUNITY): Payer: Self-pay | Admitting: Emergency Medicine

## 2017-03-08 ENCOUNTER — Emergency Department (HOSPITAL_COMMUNITY): Payer: Self-pay

## 2017-03-08 ENCOUNTER — Emergency Department (HOSPITAL_COMMUNITY)
Admission: EM | Admit: 2017-03-08 | Discharge: 2017-03-09 | Disposition: A | Discharge status: Home / Self Care | Payer: Self-pay | Attending: Emergency Medicine | Admitting: Emergency Medicine

## 2017-03-08 DIAGNOSIS — I10 Essential (primary) hypertension: Secondary | ICD-10-CM | POA: Insufficient documentation

## 2017-03-08 DIAGNOSIS — R112 Nausea with vomiting, unspecified: Secondary | ICD-10-CM | POA: Insufficient documentation

## 2017-03-08 DIAGNOSIS — R197 Diarrhea, unspecified: Secondary | ICD-10-CM | POA: Insufficient documentation

## 2017-03-08 DIAGNOSIS — R102 Pelvic and perineal pain: Secondary | ICD-10-CM

## 2017-03-08 DIAGNOSIS — F17210 Nicotine dependence, cigarettes, uncomplicated: Secondary | ICD-10-CM | POA: Insufficient documentation

## 2017-03-08 DIAGNOSIS — F1099 Alcohol use, unspecified with unspecified alcohol-induced disorder: Secondary | ICD-10-CM | POA: Insufficient documentation

## 2017-03-08 DIAGNOSIS — N390 Urinary tract infection, site not specified: Secondary | ICD-10-CM | POA: Insufficient documentation

## 2017-03-08 HISTORY — DX: Acute pancreatitis without necrosis or infection, unspecified: K85.90

## 2017-03-08 LAB — COMPREHENSIVE METABOLIC PANEL
ALBUMIN: 3.7 g/dL (ref 3.5–5.0)
ALK PHOS: 122 U/L (ref 38–126)
ALT: 50 U/L (ref 17–63)
ANION GAP: 8 (ref 5–15)
AST: 96 U/L — ABNORMAL HIGH (ref 15–41)
BILIRUBIN TOTAL: 0.3 mg/dL (ref 0.3–1.2)
BUN: 6 mg/dL (ref 6–20)
CALCIUM: 8.6 mg/dL — AB (ref 8.9–10.3)
CO2: 25 mmol/L (ref 22–32)
Chloride: 108 mmol/L (ref 101–111)
Creatinine, Ser: 0.83 mg/dL (ref 0.61–1.24)
Glucose, Bld: 95 mg/dL (ref 65–99)
POTASSIUM: 3.8 mmol/L (ref 3.5–5.1)
Sodium: 141 mmol/L (ref 135–145)
TOTAL PROTEIN: 7.4 g/dL (ref 6.5–8.1)

## 2017-03-08 LAB — URINALYSIS, ROUTINE W REFLEX MICROSCOPIC
BILIRUBIN URINE: NEGATIVE
Glucose, UA: NEGATIVE mg/dL
Hgb urine dipstick: NEGATIVE
Ketones, ur: NEGATIVE mg/dL
LEUKOCYTES UA: NEGATIVE
NITRITE: NEGATIVE
PH: 6 (ref 5.0–8.0)
Protein, ur: NEGATIVE mg/dL
SPECIFIC GRAVITY, URINE: 1.009 (ref 1.005–1.030)

## 2017-03-08 LAB — CBC
HEMATOCRIT: 28.8 % — AB (ref 39.0–52.0)
HEMOGLOBIN: 8.9 g/dL — AB (ref 13.0–17.0)
MCH: 23.6 pg — ABNORMAL LOW (ref 26.0–34.0)
MCHC: 30.9 g/dL (ref 30.0–36.0)
MCV: 76.4 fL — ABNORMAL LOW (ref 78.0–100.0)
Platelets: 203 10*3/uL (ref 150–400)
RBC: 3.77 MIL/uL — AB (ref 4.22–5.81)
RDW: 18.6 % — ABNORMAL HIGH (ref 11.5–15.5)
WBC: 4.8 10*3/uL (ref 4.0–10.5)

## 2017-03-08 LAB — LIPASE, BLOOD: Lipase: 56 U/L — ABNORMAL HIGH (ref 11–51)

## 2017-03-08 LAB — ETHANOL: ALCOHOL ETHYL (B): 271 mg/dL — AB (ref <10)

## 2017-03-08 MED ORDER — IOPAMIDOL (ISOVUE-300) INJECTION 61%
INTRAVENOUS | Status: AC
  Filled 2017-03-08: qty 30

## 2017-03-08 MED ORDER — SODIUM CHLORIDE 0.9 % IV BOLUS (SEPSIS)
1000.0000 mL | Freq: Once | INTRAVENOUS | Status: AC
  Administered 2017-03-08: 1000 mL via INTRAVENOUS

## 2017-03-08 MED ORDER — IOPAMIDOL (ISOVUE-300) INJECTION 61%
INTRAVENOUS | Status: AC
  Administered 2017-03-08: 100 mL
  Filled 2017-03-08: qty 75

## 2017-03-08 MED ORDER — CEPHALEXIN 500 MG PO CAPS: 500.0000 mg | ORAL_CAPSULE | Freq: Two times a day (BID) | ORAL | 0 refills | Status: AC

## 2017-03-08 MED ORDER — ONDANSETRON HCL 4 MG/2ML IJ SOLN
4.0000 mg | Freq: Once | INTRAMUSCULAR | Status: AC
  Administered 2017-03-08: 4 mg via INTRAVENOUS
  Filled 2017-03-08: qty 2

## 2017-03-08 MED ORDER — PANTOPRAZOLE SODIUM 40 MG IV SOLR
40.0000 mg | Freq: Once | INTRAVENOUS | Status: AC
  Administered 2017-03-08: 40 mg via INTRAVENOUS
  Filled 2017-03-08: qty 40

## 2017-03-08 MED ORDER — ONDANSETRON 4 MG PO TBDP: 4.0000 mg | ORAL_TABLET | Freq: Three times a day (TID) | ORAL | 0 refills | Status: DC | PRN

## 2017-03-08 MED ORDER — MORPHINE SULFATE (PF) 4 MG/ML IV SOLN
4.0000 mg | Freq: Once | INTRAVENOUS | Status: AC
  Administered 2017-03-08: 4 mg via INTRAVENOUS
  Filled 2017-03-08: qty 1

## 2017-03-08 NOTE — ED Notes (Signed)
Pt reports new onset of abd pain earlier today.  Pt drinks liquor and beer all day.  He denies any n/v/d.  Pt is A&Ox 4.  Calm and cooperative.  Pt reports mid abd pain, very tender to palpation.

## 2017-03-08 NOTE — ED Triage Notes (Signed)
Pt reports lower abd pain present since this AM, states pain is sharp, 8/10. Reports diarrhea. Pt has hx of alcoholic pancreatitis.

## 2017-03-08 NOTE — ED Notes (Signed)
Patient transported to CT 

## 2017-03-08 NOTE — ED Provider Notes (Signed)
[facility] EMERGENCY DEPARTMENT Provider Note   CSN: [id number] Arrival date & time: 03/08/17  2035     History   Chief Complaint Chief Complaint  Patient presents with  . Abdominal Pain    HPI Emani Morad is a 51 y.o. male.  HPI 51 year old male who presents with abdominal pain.  He has a history of alcohol abuse, hypertension, alcoholic pancreatitis and peptic ulcer disease.  States onset of sharp lower abdominal pain that started this evening associated with dry heaving and diarrhea.  Does endorse drinking daily, 3-440 ounces per day.  Denies any fevers, chills, melena or hematochezia, dysuria, urinary frequency or hematuria.  Unsure if he has had similar symptoms in the past.  States that pain is very different from that of peptic ulcer disease and pancreatitis.   Past Medical History:  Diagnosis Date  . Alcoholic hepatitis   . Depression   . ETOH abuse   . Hypertension   . Pancreatitis   . Seizures [facility])     Patient Active Problem List   Diagnosis Date Noted  . Acute blood loss anemia 02/21/2017  . UGI bleed 02/08/2017  . Alcohol withdrawal ([name]) 02/08/2017  . Cocaine abuse ([place]) 11/18/2016  . GI bleed 11/01/2016  . Alcohol abuse   . 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 Seizure ([place]) 05/26/2016  . Tobacco abuse 05/26/2016  . Homeless 05/26/2016  . Essential hypertension 05/26/2016  . Alcohol dependence with alcohol-induced mood disorder ([place]) 02/14/2016  . Severe alcohol withdrawal without perceptual disturbances without complication ([place]) 23/55/7322  . Alcoholic hepatitis without ascites 02/14/2016  . Thrombocytopenia ([place]) 02/14/2016    Past Surgical History:  Procedure Laterality Date  . ESOPHAGOGASTRODUODENOSCOPY (EGD) WITH PROPOFOL N/A 11/02/2016   Procedure: ESOPHAGOGASTRODUODENOSCOPY (EGD) WITH PROPOFOL;  Surgeon: Carol Ada, MD;  Location: [facility] ENDOSCOPY;  Service: Endoscopy;  Laterality: N/A;  . HERNIA REPAIR    . LEG SURGERY         Home Medications    Prior to Admission medications   Medication Sig Start Date End Date Taking? Authorizing Provider  cephALEXin (KEFLEX) 500 MG capsule Take 1 capsule (500 mg total) by mouth 2 (two) times daily for 7 days. 03/08/17 03/15/17  Forde Dandy, MD  docusate sodium (COLACE) 100 MG capsule Take 1 capsule (100 mg total) by mouth daily. Patient not taking: Reported on 03/08/2017 02/28/17   Little, Wenda Overland, MD  ferrous sulfate 325 (65 FE) MG tablet Take 1 tablet (325 mg total) by mouth daily. Patient not taking: Reported on 03/08/2017 02/28/17   Little, Wenda Overland, MD  gabapentin (NEURONTIN) 300 MG capsule Take 1 capsule (300 mg total) by mouth 3 (three) times daily. Patient not taking: Reported on 02/28/2017 12/30/16   Ethelene Hal, NP  hydrOXYzine (ATARAX/VISTARIL) 50 MG tablet Take 1 tablet (50 mg total) by mouth 3 (three) times daily. Patient not taking: Reported on 02/28/2017 12/30/16   Ethelene Hal, NP  ondansetron (ZOFRAN ODT) 4 MG disintegrating tablet Take 1 tablet (4 mg total) by mouth every 8 (eight) hours as needed for nausea or vomiting. 03/08/17   Forde Dandy, MD  pantoprazole (PROTONIX) 40 MG tablet Take 1 tablet (40 mg total) by mouth 2 (two) times daily. Patient not taking: Reported on 03/08/2017 02/11/17   Charlynne Cousins, MD  traZODone (DESYREL) 50 MG tablet Take 1 tablet (50 mg total) by mouth at bedtime.  Patient not taking: Reported on 02/28/2017 12/30/16   Ethelene Hal, NP    Family History Family History  Problem Relation Age of Onset  . Diabetes Mother   . Alcoholism Mother   . Emphysema Father   . Lung cancer Father   . Alcoholism Father     Social History Social History   Tobacco Use  . Smoking status: Current Every Day Smoker    Packs/day: 1.00    Types: Cigarettes  . Smokeless  tobacco: Never Used  Substance Use Topics  . Alcohol use: Yes    Comment: BAC on admission was 386  . Drug use: Yes    Frequency: 3.0 times per week    Types: Cocaine    Comment: last use today 02/28/17     Allergies   Tomato and Aspirin   Review of Systems Review of Systems  Constitutional: Negative for fever.  Respiratory: Negative for shortness of breath.   Cardiovascular: Negative for chest pain.  Gastrointestinal: Positive for abdominal pain, diarrhea and nausea.  Genitourinary: Negative for dysuria and frequency.  All other systems reviewed and are negative.    Physical Exam Updated Vital Signs BP 138/83   Pulse 88   Temp 98.3 F (36.8 C) (Oral)   Resp 18   Ht 5\' 8"  (1.727 m)   Wt 77.1 kg (170 lb)   SpO2 97%   BMI 25.85 kg/m   Physical Exam Physical Exam  Nursing note and vitals reviewed. Constitutional: Well developed, well nourished, disheveled, non-toxic, and in no acute distress Head: Normocephalic and atraumatic.  Mouth/Throat: Oropharynx is clear and moist.  Neck: Normal range of motion. Neck supple.  Cardiovascular: Normal rate and regular rhythm.   Pulmonary/Chest: Effort normal and breath sounds normal.  Abdominal: Soft.  Mild distention.  There is diffuse tenderness, worse in the suprapubic and left lower quadrant. There is no rebound.  With guarding on exam. Musculoskeletal: Normal range of motion.  Neurological: Alert, no facial droop, fluent speech, moves all extremities symmetrically Skin: Skin is warm and dry.  Psychiatric: Cooperative   ED Treatments / Results  Labs (all labs ordered are listed, but only abnormal results are displayed) Labs Reviewed  LIPASE, BLOOD - Abnormal; Notable for the following components:      Result Value   Lipase 56 (*)    All other components within normal limits  COMPREHENSIVE METABOLIC PANEL - Abnormal; Notable for the following components:   Calcium 8.6 (*)    AST 96 (*)    All other components  within normal limits  CBC - Abnormal; Notable for the following components:   RBC 3.77 (*)    Hemoglobin 8.9 (*)    HCT 28.8 (*)    MCV 76.4 (*)    MCH 23.6 (*)    RDW 18.6 (*)    All other components within normal limits  ETHANOL - Abnormal; Notable for the following components:   Alcohol, Ethyl (B) 271 (*)    All other components within normal limits  URINE CULTURE  URINALYSIS, ROUTINE W REFLEX MICROSCOPIC    EKG  EKG Interpretation None       Radiology Ct Abdomen Pelvis W Contrast  Result Date: 03/08/2017 CLINICAL DATA:  Abdomen distension with nausea and vomiting EXAM: CT ABDOMEN AND PELVIS WITH CONTRAST TECHNIQUE: Multidetector CT imaging of the abdomen and pelvis was performed using the standard protocol following bolus administration of intravenous contrast. CONTRAST:  115mL ISOVUE-300 IOPAMIDOL (ISOVUE-300) INJECTION 61% COMPARISON:  12/22/2016, 12/12/2016, 10/22/2016  FINDINGS: Lower chest: No acute abnormality. Hepatobiliary: Hepatic steatosis. No calcified gallstones or biliary dilatation Pancreas: Unremarkable. No pancreatic ductal dilatation or surrounding inflammatory changes. Spleen: Normal in size without focal abnormality. Adrenals/Urinary Tract: Adrenal glands are unremarkable. Kidneys are normal, without renal calculi, focal lesion, or hydronephrosis. Bladder is thick walled. Stomach/Bowel: Stomach is within normal limits. Appendix appears normal. No evidence of bowel wall thickening, distention, or inflammatory changes. Vascular/Lymphatic: Aortic atherosclerosis. No enlarged abdominal or pelvic lymph nodes. Reproductive: Slightly enlarged prostate Other: Negative for free air or free fluid Musculoskeletal: No acute or significant osseous findings. Degenerative changes at L5-S1 IMPRESSION: 1. Thick-walled appearance of the urinary bladder, suggest correlation with urinalysis to evaluate for cystitis. 2. Otherwise no acute abnormalities are seen 3. Hepatic steatosis  Electronically Signed   By: Donavan Foil M.D.   On: 03/08/2017 22:33    Procedures Procedures (including critical care time)  Medications Ordered in ED Medications  iopamidol (ISOVUE-300) 61 % injection (not administered)  sodium chloride 0.9 % bolus 1,000 mL (0 mLs Intravenous Stopped 03/08/17 2316)  ondansetron (ZOFRAN) injection 4 mg (4 mg Intravenous Given 03/08/17 2119)  pantoprazole (PROTONIX) injection 40 mg (40 mg Intravenous Given 03/08/17 2125)  morphine 4 MG/ML injection 4 mg (4 mg Intravenous Given 03/08/17 2120)  iopamidol (ISOVUE-300) 61 % injection (100 mLs  Contrast Given 03/08/17 2156)     Initial Impression / Assessment and Plan / ED Course  I have reviewed the triage vital signs and the nursing notes.  Pertinent labs & imaging results that were available during my care of the patient were reviewed by me and considered in my medical decision making (see chart for details).     51 year old male who presents with low abdominal pain.  Patient is nontoxic in no acute distress with normal vital signs.  He has no signs of alcohol withdrawal during the ED stay.  Abdomen is soft, but with primarily suprapubic and left lower quadrant abdominal pain.  CT was obtained to rule out potential diverticulitis or other serious intra-abdominal process.  This is visualized and shows evidence of potential cystitis/bladder wall thickening.  He does have pain primarily in this region and also with some urinary frequency.  UA however does not suggest infection.  However given his symptoms we will treat for potential UTI with course of Keflex.  Remainder blood work close to baseline.  Patient does feel improved in the ED.  Has been able to tolerate p.o. intake.  Will be discharged home. Strict return and follow-up instructions reviewed. He expressed understanding of all discharge instructions and felt comfortable with the plan of care.   Final Clinical Impressions(s) / ED Diagnoses    Final diagnoses:  Suprapubic abdominal pain  Lower urinary tract infectious disease    ED Discharge Orders        Ordered    ondansetron (ZOFRAN ODT) 4 MG disintegrating tablet  Every 8 hours PRN     03/08/17 2321    cephALEXin (KEFLEX) 500 MG capsule  2 times daily     03/08/17 2321       Forde Dandy, MD 03/08/17 [phone]

## 2017-03-08 NOTE — Discharge Instructions (Signed)
Your CT scan shows possible urinary tract infection. Although your urine is normal, your pain is primarily over the bladder. Please take a course of antibiotics for possible UTI.  Return without fail for worsening symptoms, including fever, intractable vomiting, escalating pain, or any other symptoms concerning ot you

## 2017-03-08 NOTE — ED Notes (Addendum)
Patient has been waiting in waiting room over 4 hours to be triaged.  Original arrival time via ambulance was at 1630.

## 2017-03-10 LAB — URINE CULTURE: CULTURE: NO GROWTH

## 2017-03-17 ENCOUNTER — Inpatient Hospital Stay (HOSPITAL_COMMUNITY)
Admission: EM | Admit: 2017-03-17 | Discharge: 2017-03-19 | DRG: 897 | Disposition: A | Discharge status: Home / Self Care | Payer: Self-pay | Attending: Internal Medicine | Admitting: Internal Medicine

## 2017-03-17 DIAGNOSIS — Z5900 Homelessness unspecified: Secondary | ICD-10-CM

## 2017-03-17 DIAGNOSIS — F10239 Alcohol dependence with withdrawal, unspecified: Principal | ICD-10-CM | POA: Diagnosis present

## 2017-03-17 DIAGNOSIS — F10939 Alcohol use, unspecified with withdrawal, unspecified: Secondary | ICD-10-CM | POA: Diagnosis present

## 2017-03-17 DIAGNOSIS — Z801 Family history of malignant neoplasm of trachea, bronchus and lung: Secondary | ICD-10-CM

## 2017-03-17 DIAGNOSIS — I10 Essential (primary) hypertension: Secondary | ICD-10-CM | POA: Diagnosis present

## 2017-03-17 DIAGNOSIS — Z811 Family history of alcohol abuse and dependence: Secondary | ICD-10-CM

## 2017-03-17 DIAGNOSIS — G4089 Other seizures: Secondary | ICD-10-CM | POA: Diagnosis present

## 2017-03-17 DIAGNOSIS — Y908 Blood alcohol level of 240 mg/100 ml or more: Secondary | ICD-10-CM | POA: Diagnosis present

## 2017-03-17 DIAGNOSIS — Z825 Family history of asthma and other chronic lower respiratory diseases: Secondary | ICD-10-CM

## 2017-03-17 DIAGNOSIS — D509 Iron deficiency anemia, unspecified: Secondary | ICD-10-CM | POA: Diagnosis present

## 2017-03-17 DIAGNOSIS — F10229 Alcohol dependence with intoxication, unspecified: Secondary | ICD-10-CM | POA: Diagnosis present

## 2017-03-17 DIAGNOSIS — F1010 Alcohol abuse, uncomplicated: Secondary | ICD-10-CM | POA: Diagnosis present

## 2017-03-17 DIAGNOSIS — D649 Anemia, unspecified: Secondary | ICD-10-CM | POA: Diagnosis present

## 2017-03-17 DIAGNOSIS — Z833 Family history of diabetes mellitus: Secondary | ICD-10-CM

## 2017-03-17 DIAGNOSIS — R569 Unspecified convulsions: Secondary | ICD-10-CM

## 2017-03-17 NOTE — ED Notes (Signed)
Bed: [facility] Expected date:  Expected time:  Means of arrival:  Comments: 

## 2017-03-18 ENCOUNTER — Encounter (HOSPITAL_COMMUNITY): Payer: Self-pay | Admitting: Emergency Medicine

## 2017-03-18 ENCOUNTER — Emergency Department (HOSPITAL_COMMUNITY): Payer: Self-pay

## 2017-03-18 ENCOUNTER — Other Ambulatory Visit: Payer: Self-pay

## 2017-03-18 DIAGNOSIS — Z590 Homelessness: Secondary | ICD-10-CM

## 2017-03-18 DIAGNOSIS — D509 Iron deficiency anemia, unspecified: Secondary | ICD-10-CM | POA: Diagnosis present

## 2017-03-18 DIAGNOSIS — D649 Anemia, unspecified: Secondary | ICD-10-CM

## 2017-03-18 DIAGNOSIS — F10230 Alcohol dependence with withdrawal, uncomplicated: Secondary | ICD-10-CM

## 2017-03-18 DIAGNOSIS — R569 Unspecified convulsions: Secondary | ICD-10-CM

## 2017-03-18 LAB — COMPREHENSIVE METABOLIC PANEL
ALBUMIN: 3.9 g/dL (ref 3.5–5.0)
ALK PHOS: 140 U/L — AB (ref 38–126)
ALT: 41 U/L (ref 17–63)
ANION GAP: 8 (ref 5–15)
AST: 93 U/L — ABNORMAL HIGH (ref 15–41)
BILIRUBIN TOTAL: 0.6 mg/dL (ref 0.3–1.2)
BUN: 6 mg/dL (ref 6–20)
CALCIUM: 8.6 mg/dL — AB (ref 8.9–10.3)
CO2: 24 mmol/L (ref 22–32)
Chloride: 108 mmol/L (ref 101–111)
Creatinine, Ser: 0.81 mg/dL (ref 0.61–1.24)
GLUCOSE: 112 mg/dL — AB (ref 65–99)
POTASSIUM: 4.4 mmol/L (ref 3.5–5.1)
Sodium: 140 mmol/L (ref 135–145)
TOTAL PROTEIN: 7.9 g/dL (ref 6.5–8.1)

## 2017-03-18 LAB — CBC
HCT: 28.3 % — ABNORMAL LOW (ref 39.0–52.0)
HEMOGLOBIN: 8.8 g/dL — AB (ref 13.0–17.0)
MCH: 23.8 pg — AB (ref 26.0–34.0)
MCHC: 31.1 g/dL (ref 30.0–36.0)
MCV: 76.5 fL — ABNORMAL LOW (ref 78.0–100.0)
PLATELETS: 211 10*3/uL (ref 150–400)
RBC: 3.7 MIL/uL — AB (ref 4.22–5.81)
RDW: 19 % — ABNORMAL HIGH (ref 11.5–15.5)
WBC: 4.8 10*3/uL (ref 4.0–10.5)

## 2017-03-18 LAB — I-STAT CG4 LACTIC ACID, ED
LACTIC ACID, VENOUS: 1.19 mmol/L (ref 0.5–1.9)
Lactic Acid, Venous: 1.72 mmol/L (ref 0.5–1.9)

## 2017-03-18 LAB — ACETAMINOPHEN LEVEL

## 2017-03-18 LAB — ETHANOL: ALCOHOL ETHYL (B): 369 mg/dL — AB (ref <10)

## 2017-03-18 LAB — HIV ANTIBODY (ROUTINE TESTING W REFLEX): HIV SCREEN 4TH GENERATION: NONREACTIVE

## 2017-03-18 LAB — SALICYLATE LEVEL

## 2017-03-18 MED ORDER — LORAZEPAM 1 MG PO TABS
0.0000 mg | ORAL_TABLET | Freq: Two times a day (BID) | ORAL | Status: DC
Start: 2017-03-20 — End: 2017-03-19

## 2017-03-18 MED ORDER — LORAZEPAM 2 MG/ML IJ SOLN: 0.0000 mg | Freq: Two times a day (BID) | INTRAMUSCULAR | Status: DC

## 2017-03-18 MED ORDER — TRAZODONE HCL 50 MG PO TABS
50.0000 mg | ORAL_TABLET | Freq: Every day | ORAL | Status: DC
  Administered 2017-03-18: 50 mg via ORAL
  Filled 2017-03-18: qty 1

## 2017-03-18 MED ORDER — ACETAMINOPHEN 650 MG RE SUPP: 650.0000 mg | Freq: Four times a day (QID) | RECTAL | Status: DC | PRN

## 2017-03-18 MED ORDER — FERROUS SULFATE 325 (65 FE) MG PO TABS
325.0000 mg | ORAL_TABLET | Freq: Every day | ORAL | Status: DC
  Administered 2017-03-18 – 2017-03-19 (×2): 325 mg via ORAL
  Filled 2017-03-18 (×2): qty 1

## 2017-03-18 MED ORDER — ONDANSETRON HCL 4 MG PO TABS: 4.0000 mg | ORAL_TABLET | Freq: Four times a day (QID) | ORAL | Status: DC | PRN

## 2017-03-18 MED ORDER — HYDROXYZINE HCL 25 MG PO TABS
50.0000 mg | ORAL_TABLET | Freq: Three times a day (TID) | ORAL | Status: DC
  Administered 2017-03-18 – 2017-03-19 (×4): 50 mg via ORAL
  Filled 2017-03-18 (×4): qty 2

## 2017-03-18 MED ORDER — LORAZEPAM 2 MG/ML IJ SOLN
0.0000 mg | Freq: Four times a day (QID) | INTRAMUSCULAR | Status: DC
Start: 2017-03-18 — End: 2017-03-19
  Administered 2017-03-18: 1 mg via INTRAVENOUS
  Filled 2017-03-18: qty 1

## 2017-03-18 MED ORDER — ONDANSETRON 4 MG PO TBDP: 4.0000 mg | ORAL_TABLET | Freq: Three times a day (TID) | ORAL | Status: DC | PRN

## 2017-03-18 MED ORDER — ACETAMINOPHEN 325 MG PO TABS
650.0000 mg | ORAL_TABLET | Freq: Four times a day (QID) | ORAL | Status: DC | PRN
Start: 2017-03-18 — End: 2017-03-19

## 2017-03-18 MED ORDER — LORAZEPAM 1 MG PO TABS
0.0000 mg | ORAL_TABLET | Freq: Four times a day (QID) | ORAL | Status: DC
  Administered 2017-03-18 (×2): 2 mg via ORAL
  Filled 2017-03-18 (×2): qty 2

## 2017-03-18 MED ORDER — GABAPENTIN 300 MG PO CAPS
300.0000 mg | ORAL_CAPSULE | Freq: Three times a day (TID) | ORAL | Status: DC
  Administered 2017-03-18 – 2017-03-19 (×4): 300 mg via ORAL
  Filled 2017-03-18 (×4): qty 1

## 2017-03-18 MED ORDER — VITAMIN B-1 100 MG PO TABS
100.0000 mg | ORAL_TABLET | Freq: Every day | ORAL | Status: DC
  Administered 2017-03-18 – 2017-03-19 (×2): 100 mg via ORAL
  Filled 2017-03-18 (×2): qty 1

## 2017-03-18 MED ORDER — THIAMINE HCL 100 MG/ML IJ SOLN
Freq: Once | INTRAVENOUS | Status: AC
  Administered 2017-03-18: 04:00:00 via INTRAVENOUS
  Filled 2017-03-18: qty 1000

## 2017-03-18 MED ORDER — FAMOTIDINE IN NACL 20-0.9 MG/50ML-% IV SOLN
20.0000 mg | INTRAVENOUS | Status: AC
  Administered 2017-03-18: 20 mg via INTRAVENOUS
  Filled 2017-03-18: qty 50

## 2017-03-18 MED ORDER — FOLIC ACID 1 MG PO TABS
1.0000 mg | ORAL_TABLET | Freq: Every day | ORAL | Status: DC
  Administered 2017-03-18 – 2017-03-19 (×2): 1 mg via ORAL
  Filled 2017-03-18 (×2): qty 1

## 2017-03-18 MED ORDER — ONDANSETRON HCL 4 MG/2ML IJ SOLN: 4.0000 mg | Freq: Four times a day (QID) | INTRAMUSCULAR | Status: DC | PRN

## 2017-03-18 MED ORDER — THIAMINE HCL 100 MG/ML IJ SOLN: 100.0000 mg | Freq: Every day | INTRAMUSCULAR | Status: DC

## 2017-03-18 MED ORDER — PANTOPRAZOLE SODIUM 40 MG PO TBEC
40.0000 mg | DELAYED_RELEASE_TABLET | Freq: Two times a day (BID) | ORAL | Status: DC
  Administered 2017-03-18 – 2017-03-19 (×3): 40 mg via ORAL
  Filled 2017-03-18 (×3): qty 1

## 2017-03-18 MED ORDER — DOCUSATE SODIUM 100 MG PO CAPS
100.0000 mg | ORAL_CAPSULE | Freq: Every day | ORAL | Status: DC
  Administered 2017-03-18 – 2017-03-19 (×2): 100 mg via ORAL
  Filled 2017-03-18 (×2): qty 1

## 2017-03-18 NOTE — ED Notes (Signed)
Pt reminded again that we need a urine sample. Urinal still at bedside.

## 2017-03-18 NOTE — ED Provider Notes (Signed)
[facility] DEPT Provider Note   CSN: [id number] Arrival date & time: 03/17/17  2353     History   Chief Complaint Chief Complaint  Patient presents with  . Delirium Tremens (DTS)    HPI Barry Moore is a 51 y.o. male.  51 year old male with past medical history including alcohol abuse, alcoholic hepatitis, alcohol withdrawal seizures, hypertension who presents with seizure-like activity.  Patient was brought in by EMS after a friend called them when they witnessed seizure-like activity for approximately 1.5 minutes.  No seizure activity on EMS arrival.  The patient is unable to recall any of these details.  He does endorse history of alcohol withdrawal seizures.  He reports that he drank less than usual today but did have some alcohol this evening.  He denies any fevers, vomiting, or recent illness.  LEVEL  5 CAVEAT DUE TO AMS   The history is provided by the patient and the EMS personnel.    Past Medical History:  Diagnosis Date  . Alcoholic hepatitis   . Depression   . ETOH abuse   . Hypertension   . Pancreatitis   . Seizures [facility])     Patient Active Problem List   Diagnosis Date Noted  . Anemia 03/18/2017  . Acute blood loss anemia 02/21/2017  . UGI bleed 02/08/2017  . Alcohol withdrawal ([place]) 02/08/2017  . Cocaine abuse ([place]) 11/18/2016  . GI bleed 11/01/2016  . Alcohol abuse   . 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 Seizure ([place]) 05/26/2016  . Tobacco abuse 05/26/2016  . Homeless 05/26/2016  . Essential hypertension 05/26/2016  . Alcohol dependence with alcohol-induced mood disorder ([place]) 02/14/2016  . Severe alcohol withdrawal without perceptual disturbances without complication ([place]) 73/71/0626  . Alcoholic hepatitis without ascites 02/14/2016  . Thrombocytopenia ([place]) 02/14/2016    Past Surgical History:  Procedure  Laterality Date  . ESOPHAGOGASTRODUODENOSCOPY (EGD) WITH PROPOFOL N/A 11/02/2016   Procedure: ESOPHAGOGASTRODUODENOSCOPY (EGD) WITH PROPOFOL;  Surgeon: Carol Ada, MD;  Location: [facility] ENDOS[facility];  Service: Endoscopy;  Laterality: N/A;  . HERNIA REPAIR    . LEG SURGERY         Home Medications    Prior to Admission medications   Medication Sig Start Date End Date Taking? Authorizing Provider  docusate sodium (COLACE) 100 MG capsule Take 1 capsule (100 mg total) by mouth daily. 02/28/17  Yes Little, Wenda Overland, MD  ferrous sulfate 325 (65 FE) MG tablet Take 1 tablet (325 mg total) by mouth daily. 02/28/17  Yes Little, Wenda Overland, MD  gabapentin (NEURONTIN) 300 MG capsule Take 1 capsule (300 mg total) by mouth 3 (three) times daily. 12/30/16  Yes Ethelene Hal, NP  hydrOXYzine (ATARAX/VISTARIL) 50 MG tablet Take 1 tablet (50 mg total) by mouth 3 (three) times daily. 12/30/16  Yes Ethelene Hal, NP  ondansetron (ZOFRAN ODT) 4 MG disintegrating tablet Take 1 tablet (4 mg total) by mouth every 8 (eight) hours as needed for nausea or vomiting. 03/08/17  Yes Forde Dandy, MD  pantoprazole (PROTONIX) 40 MG tablet Take 1 tablet (40 mg total) by mouth 2 (two) times daily. 02/11/17  Yes Charlynne Cousins, MD  traZODone (DESYREL) 50 MG tablet Take 1 tablet (50 mg total) by mouth at bedtime. 12/30/16  Yes Ethelene Hal, NP    Family History Family History  Problem Relation Age of Onset  . Diabetes Mother   .  Alcoholism Mother   . Emphysema Father   . Lung cancer Father   . Alcoholism Father     Social History Social History   Tobacco Use  . Smoking status: Current Every Day Smoker    Packs/day: 1.00    Types: Cigarettes  . Smokeless tobacco: Never Used  Substance Use Topics  . Alcohol use: Yes    Comment: BAC on admission was 386  . Drug use: Yes    Frequency: 3.0 times per week    Types: Cocaine    Comment: last use today 02/28/17     Allergies     Tomato and Aspirin   Review of Systems Review of Systems  Unable to perform ROS: Mental status change     Physical Exam Updated Vital Signs BP 130/85   Pulse 91   Temp 98.3 F (36.8 C) (Oral)   Resp 17   Ht 5\' 8"  (1.727 m)   Wt 77.1 kg (170 lb)   SpO2 97%   BMI 25.85 kg/m   Physical Exam  Constitutional: He appears well-developed. No distress.  Appears older than stated age, sleepy  HENT:  Head: Normocephalic.  Moist mucous membranes Healing abrasion posterior scalp  Eyes: Conjunctivae are normal. Pupils are equal, round, and reactive to light.  Neck: Neck supple.  Cardiovascular: Normal rate, regular rhythm and normal heart sounds.  No murmur heard. Pulmonary/Chest: Effort normal and breath sounds normal.  Abdominal: Soft. Bowel sounds are normal. He exhibits no distension. There is no tenderness.  Musculoskeletal: He exhibits no edema.  Neurological: He is alert.  Sleepy but arouses to voice, moving all 4 extremities equally, slowed speech  Skin: Skin is warm and dry.  Psychiatric:  Disheveled appearance  Nursing note and vitals reviewed.    ED Treatments / Results  Labs (all labs ordered are listed, but only abnormal results are displayed) Labs Reviewed  COMPREHENSIVE METABOLIC PANEL - Abnormal; Notable for the following components:      Result Value   Glucose, Bld 112 (*)    Calcium 8.6 (*)    AST 93 (*)    Alkaline Phosphatase 140 (*)    All other components within normal limits  ETHANOL - Abnormal; Notable for the following components:   Alcohol, Ethyl (B) 369 (*)    All other components within normal limits  CBC - Abnormal; Notable for the following components:   RBC 3.70 (*)    Hemoglobin 8.8 (*)    HCT 28.3 (*)    MCV 76.5 (*)    MCH 23.8 (*)    RDW 19.0 (*)    All other components within normal limits  ACETAMINOPHEN LEVEL - Abnormal; Notable for the following components:   Acetaminophen (Tylenol), Serum <10 (*)    All other components  within normal limits  SALICYLATE LEVEL  RAPID URINE DRUG SCREEN, HOSP PERFORMED  HIV ANTIBODY (ROUTINE TESTING)  I-STAT CG4 LACTIC ACID, ED  I-STAT CG4 LACTIC ACID, ED    EKG  EKG Interpretation None       Radiology Ct Head Wo Contrast  Result Date: 03/18/2017 CLINICAL DATA:  51 year old male with seizures. EXAM: CT HEAD WITHOUT CONTRAST TECHNIQUE: Contiguous axial images were obtained from the base of the skull through the vertex without intravenous contrast. COMPARISON:  Head CT dated 02/28/2017 FINDINGS: Brain: The ventricles and sulci are appropriate size for patient's age. Minimal periventricular and deep white matter chronic microvascular ischemic changes noted. There is no acute intracranial hemorrhage. No mass effect or  midline shift noted. No extra-axial fluid collection. Vascular: No hyperdense vessel or unexpected calcification. Skull: Normal. Negative for fracture or focal lesion. Sinuses/Orbits: No acute finding. Other: None IMPRESSION: No acute intracranial pathology. Electronically Signed   By: Anner Crete M.D.   On: 03/18/2017 02:06    Procedures Procedures (including critical care time)  Medications Ordered in ED Medications  LORazepam (ATIVAN) injection 0-4 mg (1 mg Intravenous Given 03/18/17 0134)    Or  LORazepam (ATIVAN) tablet 0-4 mg ( Oral See Alternative 03/18/17 0134)  LORazepam (ATIVAN) injection 0-4 mg (not administered)    Or  LORazepam (ATIVAN) tablet 0-4 mg (not administered)  thiamine (VITAMIN B-1) tablet 100 mg (not administered)    Or  thiamine (B-1) injection 100 mg (not administered)  ferrous sulfate tablet 325 mg (not administered)  gabapentin (NEURONTIN) capsule 300 mg (not administered)  docusate sodium (COLACE) capsule 100 mg (not administered)  hydrOXYzine (ATARAX/VISTARIL) tablet 50 mg (not administered)  pantoprazole (PROTONIX) EC tablet 40 mg (not administered)  traZODone (DESYREL) tablet 50 mg (not administered)  acetaminophen  (TYLENOL) tablet 650 mg (not administered)    Or  acetaminophen (TYLENOL) suppository 650 mg (not administered)  ondansetron (ZOFRAN) tablet 4 mg (not administered)    Or  ondansetron (ZOFRAN) injection 4 mg (not administered)  famotidine (PEPCID) IVPB 20 mg premix (0 mg Intravenous Stopped 03/18/17 0239)  sodium chloride 0.9 % 1,000 mL with thiamine 353 mg, folic acid 1 mg, multivitamins adult 10 mL infusion ( Intravenous Stopped 03/18/17 0608)     Initial Impression / Assessment and Plan / ED Course  I have reviewed the triage vital signs and the nursing notes.  Pertinent labs & imaging results that were available during my care of the patient were reviewed by me and considered in my medical decision making (see chart for details).    Pt w/ seizure activity witnessed by friend, has h/o alcohol withdrawal sz. No focal neuro deficits or seizure activity on exam. VS stable.  Labs notable for AST 93, alcohol level 369, hemoglobin 8.8 with history of anemia.  He later told me that he had his had a few weeks ago therefore obtain CT head which was negative for acute process.  I am concerned about his seizure-like activity in the setting of trying to cut back alcohol today.  He has history of the same.  Therefore recommended admission, which I discussed with Dr. Alcario Drought. Pt admitted for CIWA, received 1 dose ativan in ED.  Final Clinical Impressions(s) / ED Diagnoses   Final diagnoses:  None    ED Discharge Orders    None       Little, Wenda Overland, MD 03/18/17 [phone]

## 2017-03-18 NOTE — ED Notes (Signed)
Bed: WA14 Expected date:  Expected time:  Means of arrival:  Comments: Triage 4  

## 2017-03-18 NOTE — ED Triage Notes (Signed)
Pt presents by EMS for possible seizure activity. EMS reports that friend on scene had seizure for appx 1.9min. EMS reported that pt was not postictal on scene.

## 2017-03-18 NOTE — ED Notes (Signed)
Urinal at bedside. Pt aware we need a sample.

## 2017-03-18 NOTE — ED Notes (Signed)
Pt sleeping during ciwa but administering 1 mg ativan with score of 8 via triage

## 2017-03-18 NOTE — ED Notes (Signed)
Called lab add on HIV blood testing done, with past rainbow collect sent

## 2017-03-18 NOTE — H&P (Signed)
History and Physical    Barry Moore YDX:[id number] DOB: [birth year] DOA: 03/17/2017  PCP: Patient, No Pcp Per  Patient coming from: Homeless  I have personally briefly reviewed patient's old medical records in [place]  Chief Complaint: Seizure  HPI: Barry Moore is a 51 y.o. male with medical history significant of EtOH abuse, GIB.  Patient apparently was drinking less than usual today in an effort to cut back.  Began to feel ill with withdrawal symptoms so drank a lot of alcohol.  Immediately after this he had a reported seizure lasting 1.5 mins per friend who was on scene when EMS arrived.   ED Course: BAL 369, no evidence of further withdrawal at this time.  Patient put on CIWA.  EDP pushing for admission due to h/o seizure.  Patient expressing desire to go through detox at this time.   Review of Systems: As per HPI otherwise 10 point review of systems negative.   Past Medical History:  Diagnosis Date  . Alcoholic hepatitis   . Depression   . ETOH abuse   . Hypertension   . Pancreatitis   . Seizures ([place])     Past Surgical History:  Procedure Laterality Date  . ESOPHAGOGASTRODUODENOSCOPY (EGD) WITH PROPOFOL N/A 11/02/2016   Procedure: ESOPHAGOGASTRODUODENOSCOPY (EGD) WITH PROPOFOL;  Surgeon: Carol Ada, MD;  Location: [facility];  Service: Endoscopy;  Laterality: N/A;  . HERNIA REPAIR    . LEG SURGERY       reports that he has been smoking cigarettes.  He has been smoking about 1.00 pack per day. he has never used smokeless tobacco. He reports that he drinks alcohol. He reports that he uses drugs. Drug: Cocaine. Frequency: 3.00 times per week.  Allergies  Allergen Reactions  . Tomato Shortness Of Breath and Nausea And Vomiting  . Aspirin Nausea And Vomiting    Family History  Problem Relation Age of Onset  . Diabetes Mother   . Alcoholism Mother   . Emphysema Father   . Lung cancer Father   . Alcoholism Father      Prior to Admission  medications   Medication Sig Start Date End Date Taking? Authorizing Provider  docusate sodium (COLACE) 100 MG capsule Take 1 capsule (100 mg total) by mouth daily. 02/28/17  Yes Little, Wenda Overland, MD  ferrous sulfate 325 (65 FE) MG tablet Take 1 tablet (325 mg total) by mouth daily. 02/28/17  Yes Little, Wenda Overland, MD  gabapentin (NEURONTIN) 300 MG capsule Take 1 capsule (300 mg total) by mouth 3 (three) times daily. 12/30/16  Yes Ethelene Hal, NP  hydrOXYzine (ATARAX/VISTARIL) 50 MG tablet Take 1 tablet (50 mg total) by mouth 3 (three) times daily. 12/30/16  Yes Ethelene Hal, NP  ondansetron (ZOFRAN ODT) 4 MG disintegrating tablet Take 1 tablet (4 mg total) by mouth every 8 (eight) hours as needed for nausea or vomiting. 03/08/17  Yes Forde Dandy, MD  pantoprazole (PROTONIX) 40 MG tablet Take 1 tablet (40 mg total) by mouth 2 (two) times daily. 02/11/17  Yes Charlynne Cousins, MD  traZODone (DESYREL) 50 MG tablet Take 1 tablet (50 mg total) by mouth at bedtime. 12/30/16  Yes Ethelene Hal, NP    Physical Exam: Vitals:   03/18/17 0007 03/18/17 0114 03/18/17 0206 03/18/17 0334  BP: 124/74 132/82 135/79 130/80  Pulse: 95 87 92 98  Resp:   20 18  Temp:    98.3 F (36.8 C)  TempSrc:  Oral  SpO2:   97% 97%  Weight:      Height:        Constitutional: NAD, calm, comfortable Eyes: PERRL, lids and conjunctivae normal ENMT: Mucous membranes are moist. Posterior pharynx clear of any exudate or lesions.Normal dentition.  Neck: normal, supple, no masses, no thyromegaly Respiratory: clear to auscultation bilaterally, no wheezing, no crackles. Normal respiratory effort. No accessory muscle use.  Cardiovascular: Regular rate and rhythm, no murmurs / rubs / gallops. No extremity edema. 2+ pedal pulses. No carotid bruits.  Abdomen: no tenderness, no masses palpated. No hepatosplenomegaly. Bowel sounds positive.  Musculoskeletal: no clubbing / cyanosis. No  joint deformity upper and lower extremities. Good ROM, no contractures. Normal muscle tone.  Skin: no rashes, lesions, ulcers. No induration Neurologic: CN 2-12 grossly intact. Sensation intact, DTR normal. Strength 5/5 in all 4.  Psychiatric: Normal judgment and insight. Alert and oriented x 3. Normal mood.    Labs on Admission: I have personally reviewed following labs and imaging studies  CBC: Recent Labs  Lab 03/18/17 0112  WBC 4.8  HGB 8.8*  HCT 28.3*  MCV 76.5*  PLT 621   Basic Metabolic Panel: Recent Labs  Lab 03/18/17 0112  NA 140  K 4.4  CL 108  CO2 24  GLUCOSE 112*  BUN 6  CREATININE 0.81  CALCIUM 8.6*   GFR: Estimated Creatinine Clearance: 103.2 mL/min (by C-G formula based on SCr of 0.81 mg/dL). Liver Function Tests: Recent Labs  Lab 03/18/17 0112  AST 93*  ALT 41  ALKPHOS 140*  BILITOT 0.6  PROT 7.9  ALBUMIN 3.9   No results for input(s): LIPASE, AMYLASE in the last 168 hours. No results for input(s): AMMONIA in the last 168 hours. Coagulation Profile: No results for input(s): INR, PROTIME in the last 168 hours. Cardiac Enzymes: No results for input(s): CKTOTAL, CKMB, CKMBINDEX, TROPONINI in the last 168 hours. BNP (last 3 results) No results for input(s): PROBNP in the last 8760 hours. HbA1C: No results for input(s): HGBA1C in the last 72 hours. CBG: No results for input(s): GLUCAP in the last 168 hours. Lipid Profile: No results for input(s): CHOL, HDL, LDLCALC, TRIG, CHOLHDL, LDLDIRECT in the last 72 hours. Thyroid Function Tests: No results for input(s): TSH, T4TOTAL, FREET4, T3FREE, THYROIDAB in the last 72 hours. Anemia Panel: No results for input(s): VITAMINB12, FOLATE, FERRITIN, TIBC, IRON, RETICCTPCT in the last 72 hours. Urine analysis:    Component Value Date/Time   COLORURINE YELLOW 03/08/2017 2050   APPEARANCEUR CLEAR 03/08/2017 2050   LABSPEC 1.009 03/08/2017 2050   [place] 6.0 03/08/2017 2050   GLUCOSEU NEGATIVE  03/08/2017 2050   HGBUR NEGATIVE 03/08/2017 2050   [place] NEGATIVE 03/08/2017 2050   [place] 03/08/2017 2050   PROTEINUR NEGATIVE 03/08/2017 2050   NITRITE NEGATIVE 03/08/2017 2050   LEUKOCYTESUR NEGATIVE 03/08/2017 2050    Radiological Exams on Admission: Ct Head Wo Contrast  Result Date: 03/18/2017 CLINICAL DATA:  51 year old male with seizures. EXAM: CT HEAD WITHOUT CONTRAST TECHNIQUE: Contiguous axial images were obtained from the base of the skull through the vertex without intravenous contrast. COMPARISON:  Head CT dated 02/28/2017 FINDINGS: Brain: The ventricles and sulci are appropriate size for patient's age. Minimal periventricular and deep white matter chronic microvascular ischemic changes noted. There is no acute intracranial hemorrhage. No mass effect or midline shift noted. No extra-axial fluid collection. Vascular: No hyperdense vessel or unexpected calcification. Skull: Normal. Negative for fracture or focal lesion. Sinuses/Orbits: No acute finding. Other:  None IMPRESSION: No acute intracranial pathology. Electronically Signed   By: Anner Crete M.D.   On: 03/18/2017 02:06    EKG: Independently reviewed.  Assessment/Plan Principal Problem:   Seizure [facility] Active Problems:   Homeless   Essential hypertension   Alcohol abuse   Alcohol withdrawal (HCC)   Anemia    1. Seizure - 1. Likely related to EtOH withdrawal 2. Self treated with EtOH intake for now 3. Putting on CIWA to prevent further seizures 2. EtOH abuse - 1. CIWA 2. Tele monitor 3. SW consult 3. Anemia - stable over past couple of weeks 1. Continue PO iron 2. Monitor CBC while inpatient 3. Note that patient had GIB in Nov  DVT prophylaxis: SCDs Code Status: Full Family Communication: No family in room Disposition Plan: Home Consults called: None Admission status: Place in [place], [place] Hospital[facility] Pager [phone]  If 7AM-7PM, please contact day  team taking care of patient [email or web address] Password [facility]  03/18/2017, 5:32 AM

## 2017-03-18 NOTE — Progress Notes (Signed)
Patient is a 51 year old homeless male admitted overnight for the management of alcohol intoxication and alcohol withdrawal seizure. Patient seen and examined the bedside this morning.  His vitals are stable. Patient reported daily drinking of 4-5 bottles of 40 ounce beer. Patient was having mild alcohol withdrawal symptoms  along with hand tremors.  Currently alert and oriented, not agitated.  CIWA protocol has been started. We will continue to monitor the patient. Anticipate discharge tomorrow. Social worker has been consulted for homelessness.

## 2017-03-18 NOTE — ED Notes (Signed)
Asked for urine  

## 2017-03-19 MED ORDER — FOLIC ACID 1 MG PO TABS: 1.0000 mg | ORAL_TABLET | Freq: Every day | ORAL | 0 refills | Status: DC

## 2017-03-19 MED ORDER — THIAMINE HCL 100 MG PO TABS: 100.0000 mg | ORAL_TABLET | Freq: Every day | ORAL | 0 refills | Status: DC

## 2017-03-19 NOTE — Discharge Instructions (Signed)
Alcohol Abuse and Nutrition °Alcohol abuse is any pattern of alcohol consumption that harms your health, relationships, or work. Alcohol abuse can affect how your body breaks down and absorbs nutrients from food by causing your liver to work abnormally. Additionally, many people who abuse alcohol do not eat enough carbohydrates, protein, fat, vitamins, and minerals. This can cause poor nutrition (malnutrition) and a lack of nutrients (nutrient deficiencies), which can lead to further complications. °Nutrients that are commonly lacking (deficient) among people who abuse alcohol include: °· Vitamins. °? Vitamin A. This is stored in your liver. It is important for your vision, metabolism, and ability to fight off infections (immunity). °? B vitamins. These include vitamins such as folate, thiamin, and niacin. These are important in new cell growth and maintenance. °? Vitamin C. This plays an important role in iron absorption, wound healing, and immunity. °? Vitamin D. This is produced by your liver, but you can also get vitamin D from food. Vitamin D is necessary for your body to absorb and use calcium. °· Minerals. °? Calcium. This is important for your bones and your heart and blood vessel (cardiovascular) function. °? Iron. This is important for blood, muscle, and nervous system functioning. °? Magnesium. This plays an important role in muscle and nerve function, and it helps to control blood sugar and blood pressure. °? Zinc. This is important for the normal function of your nervous system and digestive system (gastrointestinal tract). ° °Nutrition is an essential component of therapy for alcohol abuse. Your health care provider or dietitian will work with you to design a plan that can help restore nutrients to your body and prevent potential complications. °What is my plan? °Your dietitian may develop a specific diet plan that is based on your condition and any other complications you may have. A diet plan will  commonly include: °· A balanced diet. °? Grains: 6-8 oz per day. °? Vegetables: 2-3 cups per day. °? Fruits: 1-2 cups per day. °? Meat and other protein: 5-6 oz per day. °? Dairy: 2-3 cups per day. °· Vitamin and mineral supplements. ° °What do I need to know about alcohol and nutrition? °· Consume foods that are high in antioxidants, such as grapes, berries, nuts, green tea, and dark green and orange vegetables. This can help to counteract some of the stress that is placed on your liver by consuming alcohol. °· Avoid food and drinks that are high in fat and sugar. Foods such as sugared soft drinks, salty snack foods, and candy contain empty calories. This means that they lack important nutrients such as protein, fiber, and vitamins. °· Eat frequent meals and snacks. Try to eat 5-6 small meals each day. °· Eat a variety of fresh fruits and vegetables each day. This will help you get plenty of water, fiber, and vitamins in your diet. °· Drink plenty of water and other clear fluids. Try to drink at least 48-64 oz (1.5-2 L) of water per day. °· If you are a vegetarian, eat a variety of protein-rich foods. Pair whole grains with plant-based proteins at meals and snacks to obtain the greatest nutrient benefit from your food. For example, eat rice with beans, put peanut butter on whole-grain toast, or eat oatmeal with sunflower seeds. °· Soak beans and whole grains overnight before cooking. This can help your body to absorb the nutrients more easily. °· Include foods fortified with vitamins and minerals in your diet. Commonly fortified foods include milk, orange juice, cereal, and bread. °·   If you are malnourished, your dietitian may recommend a high-protein, high-calorie diet. This may include: ? 2,000-3,000 calories (kilocalories) per day. ? 70-100 grams of protein per day.  Your health care provider may recommend a complete nutritional supplement beverage. This can help to restore calories, protein, and vitamins to  your body. Depending on your condition, you may be advised to consume this instead of or in addition to meals.  Limit your intake of caffeine. Replace drinks like coffee and black tea with decaffeinated coffee and herbal tea.  Eat a variety of foods that are high in omega fatty acids. These include fish, nuts and seeds, and soybeans. These foods may help your liver to recover and may also stabilize your mood.  Certain medicines may cause changes in your appetite, taste, and weight. Work with your health care provider and dietitian to make any adjustments to your medicines and diet plan.  Include other healthy lifestyle choices in your daily routine. ? Be physically active. ? Get enough sleep. ? Spend time doing activities that you enjoy.  If you are unable to take in enough food and calories by mouth, your health care provider may recommend a feeding tube. This is a tube that passes through your nose and throat, directly into your stomach. Nutritional supplement beverages can be given to you through the feeding tube to help you get the nutrients you need.  Take vitamin or mineral supplements as recommended by your health care provider. What foods can I eat? Grains Enriched pasta. Enriched rice. Fortified whole-grain bread. Fortified whole-grain cereal. Barley. Brown rice. Quinoa. [place]. Vegetables All fresh, frozen, and canned vegetables. Spinach. Kale. Artichoke. Carrots. Winter squash and pumpkin. Sweet potatoes. Broccoli. Cabbage. Cucumbers. Tomatoes. Sweet peppers. Green beans. Peas. Corn. Fruits All fresh and frozen fruits. Berries. Grapes. Mango. Papaya. Guava. Cherries. Apples. Bananas. Peaches. Plums. Pineapple. Watermelon. Cantaloupe. Oranges. Avocado. Meats and Other Protein Sources Beef liver. Lean beef. Pork. Fresh and canned chicken. Fresh fish. Oysters. Sardines. Canned tuna. Shrimp. Eggs with yolks. Nuts and seeds. Peanut butter. Beans and lentils. Soybeans.  Tofu. Dairy Whole, low-fat, and nonfat milk. Whole, low-fat, and nonfat yogurt. Cottage cheese. Sour cream. Hard and soft cheeses. Beverages Water. Herbal tea. Decaffeinated coffee. Decaffeinated green tea. 100% fruit juice. 100% vegetable juice. Instant breakfast shakes. Condiments Ketchup. Mayonnaise. Mustard. Salad dressing. Barbecue sauce. Sweets and Desserts Sugar-free ice cream. Sugar-free pudding. Sugar-free gelatin. Fats and Oils Butter. Vegetable oil, flaxseed oil, olive oil, and walnut oil. Other Complete nutrition shakes. Protein bars. Sugar-free gum. The items listed above may not be a complete list of recommended foods or beverages. Contact your dietitian for more options. What foods are not recommended? Grains Sugar-sweetened breakfast cereals. Flavored instant oatmeal. Fried breads. Vegetables Breaded or deep-fried vegetables. Fruits Dried fruit with added sugar. Candied fruit. Canned fruit in syrup. Meats and Other Protein Sources Breaded or deep-fried meats. Dairy Flavored milks. Fried cheese curds or fried cheese sticks. Beverages Alcohol. Sugar-sweetened soft drinks. Sugar-sweetened tea. Caffeinated coffee and tea. Condiments Sugar. Honey. Agave nectar. Molasses. Sweets and Desserts Chocolate. Cake. Cookies. Candy. Other Potato chips. Pretzels. Salted nuts. Candied nuts. The items listed above may not be a complete list of foods and beverages to avoid. Contact your dietitian for more information. This information is not intended to replace advice given to you by your health care provider. Make sure you discuss any questions you have with your health care provider. Document Released: 12/31/2004 Document Revised: 07/16/2015 Document Reviewed: 10/09/2013 Elsevier Interactive Patient Education  2018 [employer].

## 2017-03-19 NOTE — Discharge Summary (Signed)
Physician Discharge Summary  Barry Moore TIW:[id number] DOB: 26-Jul-1965 DOA: 03/17/2017  PCP: Patient, No Pcp Per  Admit date: 03/17/2017 Discharge date: 03/19/2017  Recommendations for Outpatient Follow-up:  1. Check CBC and BMP in outpt setting  Discharge Diagnoses:  Principal Problem:   Seizure [facility]) Active Problems:   Homeless   Essential hypertension   Alcohol abuse   Alcohol withdrawal ([place])   Anemia    Discharge Condition: stable   Diet recommendation: as tolerated   History of present illness:  51 year old homeless male admitted overnight for the management of alcohol intoxication and alcohol withdrawal seizure. Patient seen and examined the bedside this morning.  His vitals are stable. Patient reported daily drinking of 4-5 bottles of 40 ounce beer. Patient was having mild alcohol withdrawal symptoms  along with hand tremors.  Currently alert and oriented, not agitated. CIWA protocol has been started.   Hospital Course:   Principal Problem:   Seizure ([place]) - Related to Alcohol abuse - No seizures since admission  Active Problems:   Homeless - SW assistance appreciated     Essential hypertension - Stable    Alcohol abuse - Counseled on cessation     Alcohol withdrawal ([place]) - Stable - Continue thiamine and folic acid    Signed:  Leisa Lenz, MD  [facility] 03/19/2017, 1:56 PM  Pager #: [phone]  Time spent in minutes: more than 30 minutes  Procedures:  None   Consultations:  None   Discharge Exam: Vitals:   03/19/17 0852 03/19/17 1307  BP: (!) 162/89 (!) 145/81  Pulse: (!) 104 99  Resp:  16  Temp:  98.3 F (36.8 C)  SpO2:  99%   Vitals:   03/19/17 0559 03/19/17 0600 03/19/17 0852 03/19/17 1307  BP: (!) 164/89 (!) 160/88 (!) 162/89 (!) 145/81  Pulse: 87 89 (!) 104 99  Resp: 16   16  Temp: 98.7 F (37.1 C)   98.3 F (36.8 C)  TempSrc: Oral   Oral  SpO2: 98%   99%  Weight:      Height:        General:  Pt is alert, follows commands appropriately, not in acute distress Cardiovascular: Regular rate and rhythm, S1/S2 +, no murmurs Respiratory: Clear to auscultation bilaterally, no wheezing, no crackles, no rhonchi Abdominal: Soft, non tender, non distended, bowel sounds +, no guarding Extremities: no edema, no cyanosis, pulses palpable bilaterally DP and PT Neuro: Grossly nonfocal  Discharge Instructions  Discharge Instructions    Call MD for:  persistant nausea and vomiting   Complete by:  As directed    Call MD for:  redness, tenderness, or signs of infection (pain, swelling, redness, odor or green/yellow discharge around incision site)   Complete by:  As directed    Call MD for:  severe uncontrolled pain   Complete by:  As directed    Diet - low sodium heart healthy   Complete by:  As directed    Increase activity slowly   Complete by:  As directed      Allergies as of 03/19/2017      Reactions   Tomato Shortness Of Breath, Nausea And Vomiting   Aspirin Nausea And Vomiting      Medication List    TAKE these medications   docusate sodium 100 MG capsule Commonly known as:  COLACE Take 1 capsule (100 mg total) by mouth daily.   ferrous sulfate 325 (65 FE) MG tablet Take 1 tablet (325 mg total) by  mouth daily.   folic acid 1 MG tablet Commonly known as:  FOLVITE Take 1 tablet (1 mg total) by mouth daily. Start taking on:  03/20/2017   gabapentin 300 MG capsule Commonly known as:  NEURONTIN Take 1 capsule (300 mg total) by mouth 3 (three) times daily.   hydrOXYzine 50 MG tablet Commonly known as:  ATARAX/VISTARIL Take 1 tablet (50 mg total) by mouth 3 (three) times daily.   ondansetron 4 MG disintegrating tablet Commonly known as:  ZOFRAN ODT Take 1 tablet (4 mg total) by mouth every 8 (eight) hours as needed for nausea or vomiting.   pantoprazole 40 MG tablet Commonly known as:  PROTONIX Take 1 tablet (40 mg total) by mouth 2 (two) times daily.   thiamine 100  MG tablet Take 1 tablet (100 mg total) by mouth daily. Start taking on:  03/20/2017   traZODone 50 MG tablet Commonly known as:  DESYREL Take 1 tablet (50 mg total) by mouth at bedtime.         The results of significant diagnostics from this hospitalization (including imaging, microbiology, ancillary and laboratory) are listed below for reference.    Significant Diagnostic Studies: Ct Head Wo Contrast  Result Date: 03/18/2017 CLINICAL DATA:  51 year old male with seizures. EXAM: CT HEAD WITHOUT CONTRAST TECHNIQUE: Contiguous axial images were obtained from the base of the skull through the vertex without intravenous contrast. COMPARISON:  Head CT dated 02/28/2017 FINDINGS: Brain: The ventricles and sulci are appropriate size for patient's age. Minimal periventricular and deep white matter chronic microvascular ischemic changes noted. There is no acute intracranial hemorrhage. No mass effect or midline shift noted. No extra-axial fluid collection. Vascular: No hyperdense vessel or unexpected calcification. Skull: Normal. Negative for fracture or focal lesion. Sinuses/Orbits: No acute finding. Other: None IMPRESSION: No acute intracranial pathology. Electronically Signed   By: Anner Crete M.D.   On: 03/18/2017 02:06   Ct Head Wo Contrast  Result Date: 02/28/2017 CLINICAL DATA:  Dizziness for 2 weeks EXAM: CT HEAD WITHOUT CONTRAST TECHNIQUE: Contiguous axial images were obtained from the base of the skull through the vertex without intravenous contrast. COMPARISON:  11/22/2016 FINDINGS: Brain: Very mild atrophic changes are noted. No findings to suggest acute hemorrhage, acute infarction or space-occupying mass lesion are noted. Vascular: No hyperdense vessel or unexpected calcification. Skull: Normal. Negative for fracture or focal lesion. Sinuses/Orbits: No acute finding. Other: None. IMPRESSION: Mild atrophy.  No acute abnormality noted. Electronically Signed   By: Inez Catalina M.D.    On: 02/28/2017 20:12   Ct Abdomen Pelvis W Contrast  Result Date: 03/08/2017 CLINICAL DATA:  Abdomen distension with nausea and vomiting EXAM: CT ABDOMEN AND PELVIS WITH CONTRAST TECHNIQUE: Multidetector CT imaging of the abdomen and pelvis was performed using the standard protocol following bolus administration of intravenous contrast. CONTRAST:  117mL ISOVUE-300 IOPAMIDOL (ISOVUE-300) INJECTION 61% COMPARISON:  12/22/2016, 12/12/2016, 10/22/2016 FINDINGS: Lower chest: No acute abnormality. Hepatobiliary: Hepatic steatosis. No calcified gallstones or biliary dilatation Pancreas: Unremarkable. No pancreatic ductal dilatation or surrounding inflammatory changes. Spleen: Normal in size without focal abnormality. Adrenals/Urinary Tract: Adrenal glands are unremarkable. Kidneys are normal, without renal calculi, focal lesion, or hydronephrosis. Bladder is thick walled. Stomach/Bowel: Stomach is within normal limits. Appendix appears normal. No evidence of bowel wall thickening, distention, or inflammatory changes. Vascular/Lymphatic: Aortic atherosclerosis. No enlarged abdominal or pelvic lymph nodes. Reproductive: Slightly enlarged prostate Other: Negative for free air or free fluid Musculoskeletal: No acute or significant osseous findings. Degenerative changes at  L5-S1 IMPRESSION: 1. Thick-walled appearance of the urinary bladder, suggest correlation with urinalysis to evaluate for cystitis. 2. Otherwise no acute abnormalities are seen 3. Hepatic steatosis Electronically Signed   By: Donavan Foil M.D.   On: 03/08/2017 22:33    Microbiology: No results found for this or any previous visit (from the past 240 hour(s)).   Labs: Basic Metabolic Panel: Recent Labs  Lab 03/18/17 0112  NA 140  K 4.4  CL 108  CO2 24  GLUCOSE 112*  BUN 6  CREATININE 0.81  CALCIUM 8.6*   Liver Function Tests: Recent Labs  Lab 03/18/17 0112  AST 93*  ALT 41  ALKPHOS 140*  BILITOT 0.6  PROT 7.9  ALBUMIN 3.9    No results for input(s): LIPASE, AMYLASE in the last 168 hours. No results for input(s): AMMONIA in the last 168 hours. CBC: Recent Labs  Lab 03/18/17 0112  WBC 4.8  HGB 8.8*  HCT 28.3*  MCV 76.5*  PLT 211   Cardiac Enzymes: No results for input(s): CKTOTAL, CKMB, CKMBINDEX, TROPONINI in the last 168 hours. BNP: BNP (last 3 results) No results for input(s): BNP in the last 8760 hours.  ProBNP (last 3 results) No results for input(s): PROBNP in the last 8760 hours.  CBG: No results for input(s): GLUCAP in the last 168 hours.

## 2017-03-19 NOTE — Progress Notes (Signed)
Discharge instructions reviewed with patient using teach back method. Patient in need of bus pass for transportation home social worker notified.

## 2017-03-24 ENCOUNTER — Emergency Department (HOSPITAL_COMMUNITY)
Admission: EM | Admit: 2017-03-24 | Discharge: 2017-03-25 | Disposition: A | Discharge status: Home / Self Care | Payer: Self-pay | Attending: Emergency Medicine | Admitting: Emergency Medicine

## 2017-03-24 ENCOUNTER — Other Ambulatory Visit: Payer: Self-pay

## 2017-03-24 ENCOUNTER — Encounter (HOSPITAL_COMMUNITY): Payer: Self-pay | Admitting: Emergency Medicine

## 2017-03-24 DIAGNOSIS — I10 Essential (primary) hypertension: Secondary | ICD-10-CM | POA: Insufficient documentation

## 2017-03-24 DIAGNOSIS — Z79899 Other long term (current) drug therapy: Secondary | ICD-10-CM | POA: Insufficient documentation

## 2017-03-24 DIAGNOSIS — F17210 Nicotine dependence, cigarettes, uncomplicated: Secondary | ICD-10-CM | POA: Insufficient documentation

## 2017-03-24 DIAGNOSIS — F10929 Alcohol use, unspecified with intoxication, unspecified: Secondary | ICD-10-CM | POA: Insufficient documentation

## 2017-03-24 LAB — COMPREHENSIVE METABOLIC PANEL
ALBUMIN: 3.6 g/dL (ref 3.5–5.0)
ALT: 50 U/L (ref 17–63)
AST: 91 U/L — ABNORMAL HIGH (ref 15–41)
Alkaline Phosphatase: 110 U/L (ref 38–126)
Anion gap: 8 (ref 5–15)
BILIRUBIN TOTAL: 0.1 mg/dL — AB (ref 0.3–1.2)
BUN: 6 mg/dL (ref 6–20)
CO2: 25 mmol/L (ref 22–32)
Calcium: 8.4 mg/dL — ABNORMAL LOW (ref 8.9–10.3)
Chloride: 107 mmol/L (ref 101–111)
Creatinine, Ser: 0.74 mg/dL (ref 0.61–1.24)
GFR calc Af Amer: >60 mL/min (ref >60)
GFR calc non Af Amer: >60 mL/min (ref >60)
GLUCOSE: 99 mg/dL (ref 65–99)
Potassium: 3.6 mmol/L (ref 3.5–5.1)
SODIUM: 140 mmol/L (ref 135–145)
TOTAL PROTEIN: 7.2 g/dL (ref 6.5–8.1)

## 2017-03-24 LAB — CBC
HEMATOCRIT: 26.9 % — AB (ref 39.0–52.0)
Hemoglobin: 8.5 g/dL — ABNORMAL LOW (ref 13.0–17.0)
MCH: 23.9 pg — ABNORMAL LOW (ref 26.0–34.0)
MCHC: 31.6 g/dL (ref 30.0–36.0)
MCV: 75.6 fL — ABNORMAL LOW (ref 78.0–100.0)
Platelets: 195 10*3/uL (ref 150–400)
RBC: 3.56 MIL/uL — ABNORMAL LOW (ref 4.22–5.81)
RDW: 19.8 % — AB (ref 11.5–15.5)
WBC: 4 10*3/uL (ref 4.0–10.5)

## 2017-03-24 LAB — LIPASE, BLOOD: LIPASE: 53 U/L — AB (ref 11–51)

## 2017-03-24 LAB — ETHANOL: Alcohol, Ethyl (B): 312 mg/dL (ref <10)

## 2017-03-24 MED ORDER — CHLORDIAZEPOXIDE HCL 25 MG PO CAPS: ORAL_CAPSULE | ORAL | 0 refills | Status: DC

## 2017-03-24 MED ORDER — SODIUM CHLORIDE 0.9 % IV BOLUS (SEPSIS)
1000.0000 mL | Freq: Once | INTRAVENOUS | Status: AC
  Administered 2017-03-24: 1000 mL via INTRAVENOUS

## 2017-03-24 MED ORDER — LORAZEPAM 2 MG/ML IJ SOLN
1.0000 mg | Freq: Once | INTRAMUSCULAR | Status: AC
  Administered 2017-03-24: 1 mg via INTRAVENOUS
  Filled 2017-03-24: qty 1

## 2017-03-24 NOTE — ED Triage Notes (Signed)
Patient BIB EMS for possible seizure activity. Patient has history of seizures and has history of being non-compliant with his medications. Patient's intoxicated friend told EMS that the patient had 3 seizures in the past hour. Patient did not experience any post-ichtal symptoms for EMS. Patient AxOx4 in triage. Patient complaining of abdominal pain with nausea/vomiting starting at 0700 today. Patient states he has history of stomach ulcer and is concerned he has "another one."  EMS Vital Signs: CBG 154, HR 96, RR20, BP 136/82, 98% on RA.

## 2017-03-24 NOTE — Discharge Instructions (Signed)
Follow-up with follow-up with the primary care doctor.  Please consider seeing an alcohol treatment center

## 2017-03-24 NOTE — ED Notes (Signed)
Bed: WA08 Expected date:  Expected time:  Means of arrival:  Comments: EMS seizure

## 2017-03-24 NOTE — ED Provider Notes (Signed)
[facility] DEPT Provider Note   CSN: [id number] Arrival date & time: 03/24/17  1853     History   Chief Complaint No chief complaint on file.   HPI Barry Moore is a 52 y.o. male.  HPI Patient has a history of alcohol abuse, alcohol withdrawal and seizures.  Patient was recently admitted to the hospital on December 27.  Patient was admitted to hospital for alcohol withdrawal seizures.  Patient presents to the emergency room for similar symptoms.  Patient admits to drinking alcohol today.  He states he had a seizure today.  Is also been having some abdominal pain.  He called EMS and was brought to the emergency room.  Patient medical history is complicated by homelessness. Past Medical History:  Diagnosis Date  . Alcoholic hepatitis   . Depression   . ETOH abuse   . Hypertension   . Pancreatitis   . Seizures [facility])     Patient Active Problem List   Diagnosis Date Noted  . Anemia 03/18/2017  . Acute blood loss anemia 02/21/2017  . UGI bleed 02/08/2017  . Alcohol withdrawal ([place]) 02/08/2017  . Cocaine abuse ([place]) 11/18/2016  . GI bleed 11/01/2016  . Alcohol abuse   . 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 Seizure ([place]) 05/26/2016  . Tobacco abuse 05/26/2016  . Homeless 05/26/2016  . Essential hypertension 05/26/2016  . Alcohol dependence with alcohol-induced mood disorder ([place]) 02/14/2016  . Severe alcohol withdrawal without perceptual disturbances without complication ([place]) 59/16/3846  . Alcoholic hepatitis without ascites 02/14/2016  . Thrombocytopenia ([place]) 02/14/2016    Past Surgical History:  Procedure Laterality Date  . ESOPHAGOGASTRODUODENOSCOPY (EGD) WITH PROPOFOL N/A 11/02/2016   Procedure: ESOPHAGOGASTRODUODENOSCOPY (EGD) WITH PROPOFOL;  Surgeon: Carol Ada, MD;  Location: [facility] ENDOSCOPY;  Service: Endoscopy;  Laterality:  N/A;  . HERNIA REPAIR    . LEG SURGERY         Home Medications    Prior to Admission medications   Medication Sig Start Date End Date Taking? Authorizing Provider  chlordiazePOXIDE (LIBRIUM) 25 MG capsule 50mg  PO TID x 1D, then 25-50mg  PO BID X 1D, then 25-50mg  PO QD X 1D 03/24/17   Dorie Rank, MD  docusate sodium (COLACE) 100 MG capsule Take 1 capsule (100 mg total) by mouth daily. 02/28/17   Little, Wenda Overland, MD  ferrous sulfate 325 (65 FE) MG tablet Take 1 tablet (325 mg total) by mouth daily. 02/28/17   Little, Wenda Overland, MD  folic acid (FOLVITE) 1 MG tablet Take 1 tablet (1 mg total) by mouth daily. 03/20/17   Robbie Lis, MD  gabapentin (NEURONTIN) 300 MG capsule Take 1 capsule (300 mg total) by mouth 3 (three) times daily. 12/30/16   Ethelene Hal, NP  hydrOXYzine (ATARAX/VISTARIL) 50 MG tablet Take 1 tablet (50 mg total) by mouth 3 (three) times daily. 12/30/16   Ethelene Hal, NP  ondansetron (ZOFRAN ODT) 4 MG disintegrating tablet Take 1 tablet (4 mg total) by mouth every 8 (eight) hours as needed for nausea or vomiting. 03/08/17   Forde Dandy, MD  pantoprazole (PROTONIX) 40 MG tablet Take 1 tablet (40 mg total) by mouth 2 (two) times daily. 02/11/17   Charlynne Cousins, MD  thiamine 100 MG tablet Take 1 tablet (100 mg total) by mouth daily. 03/20/17   Robbie Lis, MD  traZODone (DESYREL) 50 MG tablet Take 1 tablet (50  mg total) by mouth at bedtime. 12/30/16   Ethelene Hal, NP    Family History Family History  Problem Relation Age of Onset  . Diabetes Mother   . Alcoholism Mother   . Emphysema Father   . Lung cancer Father   . Alcoholism Father     Social History Social History   Tobacco Use  . Smoking status: Current Every Day Smoker    Packs/day: 1.00    Types: Cigarettes  . Smokeless tobacco: Never Used  Substance Use Topics  . Alcohol use: Yes    Comment: BAC on admission was 386  . Drug use: Yes    Frequency: 3.0  times per week    Types: Cocaine    Comment: last use today 02/28/17     Allergies   Tomato and Aspirin   Review of Systems Review of Systems  Constitutional: Negative for fever.  HENT: Negative for trouble swallowing.   Respiratory: Negative for cough and shortness of breath.   Cardiovascular: Negative for chest pain.  Gastrointestinal: Negative for diarrhea and vomiting.  Genitourinary: Negative for dysuria.  Neurological: Positive for seizures. Negative for numbness.  All other systems reviewed and are negative.    Physical Exam Updated Vital Signs BP 116/71   Pulse 99   Temp 98.8 F (37.1 C) (Oral)   Resp (!) 23   SpO2 94%   Physical Exam  Constitutional: He appears well-developed and well-nourished. No distress.  HENT:  Head: Normocephalic and atraumatic.  Right Ear: External ear normal.  Left Ear: External ear normal.  Eyes: Conjunctivae are normal. Right eye exhibits no discharge. Left eye exhibits no discharge. No scleral icterus.  Neck: Neck supple. No tracheal deviation present.  Cardiovascular: Normal rate, regular rhythm and intact distal pulses.  Pulmonary/Chest: Effort normal and breath sounds normal. No stridor. No respiratory distress. He has no wheezes. He has no rales.  Abdominal: Soft. Bowel sounds are normal. He exhibits no distension. There is tenderness in the epigastric area. There is no rebound and no guarding.  Musculoskeletal: He exhibits no edema or tenderness.  Neurological: He is alert. He has normal strength. No cranial nerve deficit (no facial droop, extraocular movements intact, no slurred speech) or sensory deficit. He exhibits normal muscle tone. He displays no seizure activity. Coordination normal.  Skin: Skin is warm and dry. No rash noted.  Psychiatric: He has a normal mood and affect.  Nursing note and vitals reviewed.    ED Treatments / Results  Labs (all labs ordered are listed, but only abnormal results are displayed) Labs  Reviewed  CBC - Abnormal; Notable for the following components:      Result Value   RBC 3.56 (*)    Hemoglobin 8.5 (*)    HCT 26.9 (*)    MCV 75.6 (*)    MCH 23.9 (*)    RDW 19.8 (*)    All other components within normal limits  COMPREHENSIVE METABOLIC PANEL - Abnormal; Notable for the following components:   Calcium 8.4 (*)    AST 91 (*)    Total Bilirubin 0.1 (*)    All other components within normal limits  LIPASE, BLOOD - Abnormal; Notable for the following components:   Lipase 53 (*)    All other components within normal limits  ETHANOL - Abnormal; Notable for the following components:   Alcohol, Ethyl (B) 312 (*)    All other components within normal limits  URINALYSIS, ROUTINE W REFLEX MICROSCOPIC   Procedures  Procedures (including critical care time)  Medications Ordered in ED Medications  LORazepam (ATIVAN) injection 1 mg (1 mg Intravenous Given 03/24/17 2025)  sodium chloride 0.9 % bolus 1,000 mL (1,000 mLs Intravenous New Bag/Given 03/24/17 2025)     Initial Impression / Assessment and Plan / ED Course  I have reviewed the triage vital signs and the nursing notes.  Pertinent labs & imaging results that were available during my care of the patient were reviewed by me and considered in my medical decision making (see chart for details).  Clinical Course as of Mar 24 2354  Thu Mar 24, 2017  2049 Resting comfortably.  Not tremulous  [JK]  2242 Anemia is stable.  Alcohol intoxication noted  [JK]    Clinical Course User Index [JK] Dorie Rank, MD   Patient presented to the emergency room with complaints of possible seizures and alcohol intoxication.  Patient remained stable in the emergency room.  He did not have any seizure activity.  His alcohol level was elevated.  Patient was monitored in the emergency room.  He remained stable.  No signs of DTs.  Will discharge home with a prescription for Librium  Final Clinical Impressions(s) / ED Diagnoses   Final diagnoses:    Alcoholic intoxication with complication [facility])    ED Discharge Orders        Ordered    chlordiazePOXIDE (LIBRIUM) 25 MG capsule     03/24/17 2354       Dorie Rank, MD 03/24/17 2357

## 2017-03-26 ENCOUNTER — Encounter (HOSPITAL_COMMUNITY): Payer: Self-pay | Admitting: Emergency Medicine

## 2017-03-26 ENCOUNTER — Emergency Department (HOSPITAL_COMMUNITY)
Admission: EM | Admit: 2017-03-26 | Discharge: 2017-03-27 | Disposition: A | Discharge status: Home / Self Care | Payer: Self-pay | Attending: Emergency Medicine | Admitting: Emergency Medicine

## 2017-03-26 DIAGNOSIS — F17210 Nicotine dependence, cigarettes, uncomplicated: Secondary | ICD-10-CM | POA: Insufficient documentation

## 2017-03-26 DIAGNOSIS — R110 Nausea: Secondary | ICD-10-CM | POA: Insufficient documentation

## 2017-03-26 DIAGNOSIS — I10 Essential (primary) hypertension: Secondary | ICD-10-CM | POA: Insufficient documentation

## 2017-03-26 DIAGNOSIS — Z79899 Other long term (current) drug therapy: Secondary | ICD-10-CM | POA: Insufficient documentation

## 2017-03-26 MED ORDER — ONDANSETRON 4 MG PO TBDP
4.0000 mg | ORAL_TABLET | Freq: Once | ORAL | Status: AC
  Administered 2017-03-26: 4 mg via ORAL
  Filled 2017-03-26: qty 1

## 2017-03-26 MED ORDER — ONDANSETRON 4 MG PO TBDP: 4.0000 mg | ORAL_TABLET | Freq: Three times a day (TID) | ORAL | 0 refills | Status: DC | PRN

## 2017-03-26 NOTE — Discharge Instructions (Signed)
Take Zofran as needed for nausea. Return to ED for worsening symptoms, vomiting, vomiting up blood, severe abdominal pain.

## 2017-03-26 NOTE — ED Provider Notes (Signed)
[facility] EMERGENCY DEPARTMENT Provider Note   CSN: [id number] Arrival date & time: 03/26/17  2217     History   Chief Complaint Chief Complaint  Patient presents with  . Nausea  . Alcohol Intoxication    HPI Barry Moore is a 52 y.o. male with a past medical history of EtOH abuse, alcoholic hepatitis, depression, hypertension, who presents to ED for evaluation of nausea. States that he began feeling nauseous this morning.  States that every time he tries to eat something he gets nauseous.  He denies any vomiting, hematemesis, abdominal pain, head injuries or falls.  He is not taking any medications to help with nausea prior to arrival.  HPI  Past Medical History:  Diagnosis Date  . Alcoholic hepatitis   . Depression   . ETOH abuse   . Hypertension   . Pancreatitis   . Seizures [facility])     Patient Active Problem List   Diagnosis Date Noted  . Anemia 03/18/2017  . Acute blood loss anemia 02/21/2017  . UGI bleed 02/08/2017  . Alcohol withdrawal ([place]) 02/08/2017  . Cocaine abuse ([place]) 11/18/2016  . GI bleed 11/01/2016  . Alcohol abuse   . 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 Seizure ([place]) 05/26/2016  . Tobacco abuse 05/26/2016  . Homeless 05/26/2016  . Essential hypertension 05/26/2016  . Alcohol dependence with alcohol-induced mood disorder ([place]) 02/14/2016  . Severe alcohol withdrawal without perceptual disturbances without complication ([place]) 55/73/2202  . Alcoholic hepatitis without ascites 02/14/2016  . Thrombocytopenia ([place]) 02/14/2016    Past Surgical History:  Procedure Laterality Date  . ESOPHAGOGASTRODUODENOSCOPY (EGD) WITH PROPOFOL N/A 11/02/2016   Procedure: ESOPHAGOGASTRODUODENOSCOPY (EGD) WITH PROPOFOL;  Surgeon: Carol Ada, MD;  Location: [facility] ENDOSCOPY;  Service: Endoscopy;  Laterality: N/A;  . HERNIA REPAIR    . LEG SURGERY          Home Medications    Prior to Admission medications   Medication Sig Start Date End Date Taking? Authorizing Provider  chlordiazePOXIDE (LIBRIUM) 25 MG capsule 50mg  PO TID x 1D, then 25-50mg  PO BID X 1D, then 25-50mg  PO QD X 1D 03/24/17   Dorie Rank, MD  docusate sodium (COLACE) 100 MG capsule Take 1 capsule (100 mg total) by mouth daily. 02/28/17   Little, Wenda Overland, MD  ferrous sulfate 325 (65 FE) MG tablet Take 1 tablet (325 mg total) by mouth daily. 02/28/17   Little, Wenda Overland, MD  folic acid (FOLVITE) 1 MG tablet Take 1 tablet (1 mg total) by mouth daily. 03/20/17   Robbie Lis, MD  gabapentin (NEURONTIN) 300 MG capsule Take 1 capsule (300 mg total) by mouth 3 (three) times daily. 12/30/16   Ethelene Hal, NP  hydrOXYzine (ATARAX/VISTARIL) 50 MG tablet Take 1 tablet (50 mg total) by mouth 3 (three) times daily. 12/30/16   Ethelene Hal, NP  ondansetron (ZOFRAN ODT) 4 MG disintegrating tablet Take 1 tablet (4 mg total) by mouth every 8 (eight) hours as needed for nausea or vomiting. 03/26/17   Laurren Lepkowski, PA-C  pantoprazole (PROTONIX) 40 MG tablet Take 1 tablet (40 mg total) by mouth 2 (two) times daily. 02/11/17   Charlynne Cousins, MD  thiamine 100 MG tablet Take 1 tablet (100 mg total) by mouth daily. 03/20/17   Robbie Lis, MD  traZODone (DESYREL) 50 MG tablet Take 1 tablet (50 mg total) by mouth at bedtime. 12/30/16  Ethelene Hal, NP    Family History Family History  Problem Relation Age of Onset  . Diabetes Mother   . Alcoholism Mother   . Emphysema Father   . Lung cancer Father   . Alcoholism Father     Social History Social History   Tobacco Use  . Smoking status: Current Every Day Smoker    Packs/day: 1.00    Types: Cigarettes  . Smokeless tobacco: Never Used  Substance Use Topics  . Alcohol use: Yes    Comment: BAC on admission was 386  . Drug use: Yes    Frequency: 3.0 times per week    Types: Cocaine     Comment: last use today 02/28/17     Allergies   Tomato and Aspirin   Review of Systems Review of Systems  Constitutional: Negative for chills and fever.  Respiratory: Negative for shortness of breath.   Gastrointestinal: Positive for nausea. Negative for abdominal pain, constipation, diarrhea and vomiting.  Genitourinary: Negative for dysuria.  Skin: Negative for wound.     Physical Exam Updated Vital Signs BP (!) 140/98 (BP Location: Right Arm)   Pulse 80   Temp 97.8 F (36.6 C) (Oral)   Resp 15   Ht 5\' 10"  (1.778 m)   Wt 83.5 kg (184 lb)   SpO2 100%   BMI 26.40 kg/m   Physical Exam  Constitutional: He appears well-developed and well-nourished. No distress.  Nontoxic appearing and in no acute distress.  HENT:  Head: Normocephalic and atraumatic.  Eyes: Conjunctivae and EOM are normal. No scleral icterus.  Neck: Normal range of motion.  Cardiovascular: Normal rate, regular rhythm and normal heart sounds.  Pulmonary/Chest: Effort normal and breath sounds normal. No respiratory distress.  Abdominal: Soft. Bowel sounds are normal. There is no tenderness.  No abdominal tenderness to palpation.  Neurological: He is alert.  Skin: No rash noted. He is not diaphoretic.  Psychiatric: He has a normal mood and affect.  Nursing note and vitals reviewed.    ED Treatments / Results  Labs (all labs ordered are listed, but only abnormal results are displayed) Labs Reviewed - No data to display  EKG  EKG Interpretation None       Radiology No results found.  Procedures Procedures (including critical care time)  Medications Ordered in ED Medications  ondansetron (ZOFRAN-ODT) disintegrating tablet 4 mg (4 mg Oral Given 03/26/17 2338)     Initial Impression / Assessment and Plan / ED Course  I have reviewed the triage vital signs and the nursing notes.  Pertinent labs & imaging results that were available during my care of the patient were reviewed by me and  considered in my medical decision making (see chart for details).     Patient, with a past medical history of EtOH abuse, alcoholic hepatitis, depression, hypertension who presents to ED for evaluation of nausea since this morning.  States nausea every time he tries to eat.  Denies any vomiting, hematemesis, abdominal pain, head injuries or falls.  States that he otherwise feels fine.  He admits to drinking alcohol today.  On physical exam patient does not have any tremors or other abnormalities noted.  Does not appear to be in withdrawal.  Normal heart rate noted.  No abdominal tenderness to palpation here.  He has been resting comfortably on examination with no emesis or complaints of nausea.  Patient given Zofran with relief in his symptoms, states he is no longer feeling "queasy."  Will discharge  home with Zofran and advised him to take as needed.  Patient appears stable for discharge at this time.  Strict return precautions given.  Final Clinical Impressions(s) / ED Diagnoses   Final diagnoses:  Nausea    ED Discharge Orders        Ordered    ondansetron (ZOFRAN ODT) 4 MG disintegrating tablet  Every 8 hours PRN     03/26/17 2359     Portions of this note were generated with Dragon dictation software. Dictation errors may occur despite best attempts at proofreading.    Delia Heady, PA-C 03/27/17 0025    Little, Wenda Overland, MD 03/28/17 2042

## 2017-03-26 NOTE — ED Triage Notes (Signed)
Per EMS, pt homeless and called out nausea, denies emesis. Pt admits to drinking beer/liquer.  Pt alert and oriented.  Pt requested detox.

## 2017-03-26 NOTE — ED Notes (Signed)
Pt sleeping soundly at this time in exam chair, NAD, no emesis

## 2017-04-01 ENCOUNTER — Emergency Department (HOSPITAL_COMMUNITY)
Admission: EM | Admit: 2017-04-01 | Discharge: 2017-04-02 | Disposition: A | Discharge status: Home / Self Care | Payer: Self-pay | Attending: Emergency Medicine | Admitting: Emergency Medicine

## 2017-04-01 ENCOUNTER — Encounter (HOSPITAL_COMMUNITY): Payer: Self-pay | Admitting: Nurse Practitioner

## 2017-04-01 DIAGNOSIS — R109 Unspecified abdominal pain: Secondary | ICD-10-CM | POA: Insufficient documentation

## 2017-04-01 DIAGNOSIS — Z5321 Procedure and treatment not carried out due to patient leaving prior to being seen by health care provider: Secondary | ICD-10-CM | POA: Insufficient documentation

## 2017-04-01 DIAGNOSIS — F10929 Alcohol use, unspecified with intoxication, unspecified: Secondary | ICD-10-CM | POA: Insufficient documentation

## 2017-04-01 DIAGNOSIS — R110 Nausea: Secondary | ICD-10-CM | POA: Insufficient documentation

## 2017-04-01 LAB — COMPREHENSIVE METABOLIC PANEL
ALBUMIN: 4.1 g/dL (ref 3.5–5.0)
ALK PHOS: 106 U/L (ref 38–126)
ALT: 46 U/L (ref 17–63)
AST: 94 U/L — ABNORMAL HIGH (ref 15–41)
Anion gap: 9 (ref 5–15)
BUN: 7 mg/dL (ref 6–20)
CALCIUM: 8.9 mg/dL (ref 8.9–10.3)
CHLORIDE: 107 mmol/L (ref 101–111)
CO2: 24 mmol/L (ref 22–32)
Creatinine, Ser: 0.9 mg/dL (ref 0.61–1.24)
GFR calc Af Amer: >60 mL/min (ref >60)
GFR calc non Af Amer: >60 mL/min (ref >60)
GLUCOSE: 103 mg/dL — AB (ref 65–99)
Potassium: 4 mmol/L (ref 3.5–5.1)
SODIUM: 140 mmol/L (ref 135–145)
Total Bilirubin: 0.5 mg/dL (ref 0.3–1.2)
Total Protein: 8 g/dL (ref 6.5–8.1)

## 2017-04-01 LAB — CBC
HCT: 29.9 % — ABNORMAL LOW (ref 39.0–52.0)
Hemoglobin: 9.2 g/dL — ABNORMAL LOW (ref 13.0–17.0)
MCH: 23.2 pg — AB (ref 26.0–34.0)
MCHC: 30.8 g/dL (ref 30.0–36.0)
MCV: 75.5 fL — AB (ref 78.0–100.0)
Platelets: 217 10*3/uL (ref 150–400)
RBC: 3.96 MIL/uL — AB (ref 4.22–5.81)
RDW: 19.6 % — ABNORMAL HIGH (ref 11.5–15.5)
WBC: 4.6 10*3/uL (ref 4.0–10.5)

## 2017-04-01 LAB — ETHANOL: ALCOHOL ETHYL (B): 314 mg/dL — AB (ref <10)

## 2017-04-01 LAB — LIPASE, BLOOD: LIPASE: 56 U/L — AB (ref 11–51)

## 2017-04-01 NOTE — ED Notes (Signed)
Pt aware urine sample is needed, pt unable at this time. Pt is ambulatory in lobby

## 2017-04-01 NOTE — ED Triage Notes (Signed)
Pt is c/o abdominal pain with nausea. Reports drinking liquor earlier today. C/o constant nausea without emesis.

## 2017-04-02 NOTE — ED Notes (Signed)
Per registration, patient left. 

## 2017-04-02 NOTE — ED Notes (Signed)
Patient called for room x3 

## 2017-04-02 NOTE — ED Notes (Signed)
Patient called twice with no answer.

## 2017-04-02 NOTE — ED Notes (Signed)
Pt sleeping soundly in lobby

## 2017-04-04 ENCOUNTER — Emergency Department (HOSPITAL_COMMUNITY)
Admission: EM | Admit: 2017-04-04 | Discharge: 2017-04-05 | Disposition: A | Discharge status: Home / Self Care | Payer: Self-pay | Attending: Emergency Medicine | Admitting: Emergency Medicine

## 2017-04-04 ENCOUNTER — Encounter (HOSPITAL_COMMUNITY): Payer: Self-pay

## 2017-04-04 DIAGNOSIS — F17210 Nicotine dependence, cigarettes, uncomplicated: Secondary | ICD-10-CM | POA: Insufficient documentation

## 2017-04-04 DIAGNOSIS — F10920 Alcohol use, unspecified with intoxication, uncomplicated: Secondary | ICD-10-CM

## 2017-04-04 DIAGNOSIS — I10 Essential (primary) hypertension: Secondary | ICD-10-CM | POA: Insufficient documentation

## 2017-04-04 DIAGNOSIS — Z79899 Other long term (current) drug therapy: Secondary | ICD-10-CM | POA: Insufficient documentation

## 2017-04-04 DIAGNOSIS — F10929 Alcohol use, unspecified with intoxication, unspecified: Secondary | ICD-10-CM | POA: Insufficient documentation

## 2017-04-04 LAB — CBC WITH DIFFERENTIAL/PLATELET
BASOS ABS: 0 10*3/uL (ref 0.0–0.1)
Basophils Relative: 1 %
EOS PCT: 5 %
Eosinophils Absolute: 0.2 10*3/uL (ref 0.0–0.7)
HCT: 29.1 % — ABNORMAL LOW (ref 39.0–52.0)
Hemoglobin: 9.1 g/dL — ABNORMAL LOW (ref 13.0–17.0)
LYMPHS PCT: 39 %
Lymphs Abs: 1.5 10*3/uL (ref 0.7–4.0)
MCH: 23.6 pg — ABNORMAL LOW (ref 26.0–34.0)
MCHC: 31.3 g/dL (ref 30.0–36.0)
MCV: 75.6 fL — AB (ref 78.0–100.0)
Monocytes Absolute: 0.6 10*3/uL (ref 0.1–1.0)
Monocytes Relative: 16 %
Neutro Abs: 1.5 10*3/uL — ABNORMAL LOW (ref 1.7–7.7)
Neutrophils Relative %: 39 %
PLATELETS: 171 10*3/uL (ref 150–400)
RBC: 3.85 MIL/uL — AB (ref 4.22–5.81)
RDW: 19.6 % — ABNORMAL HIGH (ref 11.5–15.5)
WBC: 3.9 10*3/uL — AB (ref 4.0–10.5)

## 2017-04-04 NOTE — ED Provider Notes (Signed)
[facility] DEPT Provider Note   CSN: [id number] Arrival date & time: 04/04/17  2306     History   Chief Complaint Chief Complaint  Patient presents with  . Withdrawal    HPI Barry Moore is a 52 y.o. male.  HPI  This is a 52 year old male with a history of alcohol abuse, seizures, pancreatitis who presents with bright red blood per rectum.  Patient was triaged with concerns for alcohol withdrawal.  However, he is snoring on my initial assessment.  He is arousable and able to provide history.  He states that he developed bright red blood per rectum today.  His last drink was today.  He drinks daily.  Denies any bloody emesis.  Does report some lower abdominal pain which she rates at 6 out of 10.  Denies any dizziness, chest pain, shortness of breath.  Denies any fevers.  Past Medical History:  Diagnosis Date  . Alcoholic hepatitis   . Depression   . ETOH abuse   . Hypertension   . Pancreatitis   . Seizures [facility])     Patient Active Problem List   Diagnosis Date Noted  . Anemia 03/18/2017  . Acute blood loss anemia 02/21/2017  . UGI bleed 02/08/2017  . Alcohol withdrawal ([place]) 02/08/2017  . Cocaine abuse ([place]) 11/18/2016  . GI bleed 11/01/2016  . Alcohol abuse   . 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 Seizure ([place]) 05/26/2016  . Tobacco abuse 05/26/2016  . Homeless 05/26/2016  . Essential hypertension 05/26/2016  . Alcohol dependence with alcohol-induced mood disorder ([place]) 02/14/2016  . Severe alcohol withdrawal without perceptual disturbances without complication ([place]) 26/94/8546  . Alcoholic hepatitis without ascites 02/14/2016  . Thrombocytopenia ([place]) 02/14/2016    Past Surgical History:  Procedure Laterality Date  . ESOPHAGOGASTRODUODENOSCOPY (EGD) WITH PROPOFOL N/A 11/02/2016   Procedure: ESOPHAGOGASTRODUODENOSCOPY (EGD)  WITH PROPOFOL;  Surgeon: Carol Ada, MD;  [facility] END[facility];  Service: Endoscopy;  Laterality: N/A;  . HERNIA REPAIR    . LEG SURGERY         Home Medications    Prior to Admission medications   Medication Sig Start Date End Date Taking? Authorizing Provider  docusate sodium (COLACE) 100 MG capsule Take 1 capsule (100 mg total) by mouth daily. Patient taking differently: Take 100 mg by mouth daily as needed for mild constipation or moderate constipation.  02/28/17  Yes Little, Wenda Overland, MD  ferrous sulfate 325 (65 FE) MG tablet Take 1 tablet (325 mg total) by mouth daily. 02/28/17  Yes Little, Wenda Overland, MD  folic acid (FOLVITE) 1 MG tablet Take 1 tablet (1 mg total) by mouth daily. 03/20/17  Yes Robbie Lis, MD  gabapentin (NEURONTIN) 300 MG capsule Take 1 capsule (300 mg total) by mouth 3 (three) times daily. Patient taking differently: Take 300 mg by mouth 3 (three) times daily as needed (pain).  12/30/16  Yes Ethelene Hal, NP  hydrOXYzine (ATARAX/VISTARIL) 50 MG tablet Take 1 tablet (50 mg total) by mouth 3 (three) times daily. Patient taking differently: Take 50 mg by mouth every 8 (eight) hours as needed for anxiety.  12/30/16  Yes Ethelene Hal, NP  pantoprazole (PROTONIX) 40 MG tablet Take 1 tablet (40 mg total) by mouth 2 (two) times daily. Patient taking differently: Take 40 mg by mouth 2 (two) times daily as needed (reflux).  02/11/17  Yes Charlynne Cousins, MD  thiamine 100 MG tablet Take 1 tablet (100 mg total) by mouth daily. 03/20/17  Yes Robbie Lis, MD  traZODone (DESYREL) 50 MG tablet Take 1 tablet (50 mg total) by mouth at bedtime. Patient taking differently: Take 50 mg by mouth at bedtime as needed for sleep.  12/30/16  Yes Ethelene Hal, NP  chlordiazePOXIDE (LIBRIUM) 25 MG capsule 50mg  PO TID x 1D, then 25-50mg  PO BID X 1D, then 25-50mg  PO QD X 1D Patient not taking: Reported on 04/04/2017 03/24/17   Dorie Rank, MD    omeprazole (PRILOSEC) 20 MG capsule Take 1 capsule (20 mg total) by mouth daily. 04/05/17   Kristen Bushway, Barbette Hair, MD  ondansetron (ZOFRAN ODT) 4 MG disintegrating tablet Take 1 tablet (4 mg total) by mouth every 8 (eight) hours as needed for nausea or vomiting. Patient not taking: Reported on 04/04/2017 03/26/17   Delia Heady, PA-C    Family History Family History  Problem Relation Age of Onset  . Diabetes Mother   . Alcoholism Mother   . Emphysema Father   . Lung cancer Father   . Alcoholism Father     Social History Social History   Tobacco Use  . Smoking status: Current Every Day Smoker    Packs/day: 1.00    Types: Cigarettes  . Smokeless tobacco: Never Used  Substance Use Topics  . Alcohol use: Yes    Comment: BAC on admission was 386  . Drug use: Yes    Frequency: 3.0 times per week    Types: Cocaine    Comment: last use today 02/28/17     Allergies   Tomato and Aspirin   Review of Systems Review of Systems  Constitutional: Negative for fever.  Respiratory: Negative for shortness of breath.   Cardiovascular: Negative for chest pain.  Gastrointestinal: Positive for abdominal pain and blood in stool. Negative for nausea and vomiting.  Genitourinary: Negative for dysuria.  Neurological: Negative for weakness.  All other systems reviewed and are negative.    Physical Exam Updated Vital Signs BP 134/87   Pulse 88   Temp 98.3 F (36.8 C) (Oral)   Resp 18   Ht 5\' 10"  (1.778 m)   Wt 83.5 kg (184 lb)   SpO2 96%   BMI 26.40 kg/m   Physical Exam  Constitutional: He is oriented to person, place, and time.  Disheveled appearing, smells of alcohol, no acute distress  HENT:  Head: Normocephalic and atraumatic.  Eyes: Pupils are equal, round, and reactive to light.  Cardiovascular: Normal rate, regular rhythm and normal heart sounds.  No murmur heard. Pulmonary/Chest: Effort normal and breath sounds normal. No respiratory distress. He has no wheezes.   Abdominal: Soft. Bowel sounds are normal. There is tenderness. There is no rebound.  Mild diffuse tenderness to palpation without rebound or guarding  Musculoskeletal: He exhibits no edema.  Neurological: He is alert and oriented to person, place, and time.  No tremors noted  Skin: Skin is warm and dry.  Psychiatric: He has a normal mood and affect.  Appears intoxicated  Nursing note and vitals reviewed.    ED Treatments / Results  Labs (all labs ordered are listed, but only abnormal results are displayed) Labs Reviewed  CBC WITH DIFFERENTIAL/PLATELET - Abnormal; Notable for the following components:      Result Value   WBC 3.9 (*)    RBC 3.85 (*)    Hemoglobin 9.1 (*)    HCT 29.1 (*)    MCV 75.6 (*)  MCH 23.6 (*)    RDW 19.6 (*)    Neutro Abs 1.5 (*)    All other components within normal limits  COMPREHENSIVE METABOLIC PANEL - Abnormal; Notable for the following components:   Glucose, Bld 109 (*)    Calcium 8.8 (*)    AST 87 (*)    All other components within normal limits  LIPASE, BLOOD - Abnormal; Notable for the following components:   Lipase 55 (*)    All other components within normal limits  ETHANOL - Abnormal; Notable for the following components:   Alcohol, Ethyl (B) 333 (*)    All other components within normal limits  POC OCCULT BLOOD, ED    EKG  EKG Interpretation None       Radiology No results found.  Procedures Procedures (including critical care time)  Medications Ordered in ED Medications - No data to display   Initial Impression / Assessment and Plan / ED Course  I have reviewed the triage vital signs and the nursing notes.  Pertinent labs & imaging results that were available during my care of the patient were reviewed by me and considered in my medical decision making (see chart for details).  Clinical Course as of Apr 05 604  Tue Apr 05, 2017  4650 Patient continues to sleep.  Refusing rectal exam.  No indications of acute  bleeding.  [CH]    Clinical Course User Index [CH] Ersilia Brawley, Barbette Hair, MD    Patient presents with reported bloody stools.  He was brought in by EMS with concerns for DTs.  He denies any seizures to me.  He is acutely intoxicated and his initial see was score is 0.  Is hemodynamically stable.  Lab work obtained.  No evidence of anemia.  On multiple rechecks he is resting without difficulty.  Nursing did attempt Hemoccult exam; however, there was not enough to test.  Denied any blood on her finger.  Patient declined repeat exam for me.  No active evidence of ongoing bleeding.  Blood alcohol level 333.  Patient slept without any incident throughout the night.  On recheck this morning he is arousable and ambulatory independently.  No signs or symptoms of withdrawal at this time.  After history, exam, and medical workup I feel the patient has been appropriately medically screened and is safe for discharge home. Pertinent diagnoses were discussed with the patient. Patient was given return precautions.   Final Clinical Impressions(s) / ED Diagnoses   Final diagnoses:  Alcoholic intoxication without complication [facility])    ED Discharge Orders        Ordered    omeprazole (PRILOSEC) 20 MG capsule  Daily     04/05/17 0605       Merryl Hacker, MD 04/05/17 [phone]

## 2017-04-04 NOTE — ED Triage Notes (Signed)
Pt arrived via [facility] with delirium tremors. Pt reports he normally drinks roughly 4 pints a day and has been trying to cut down.

## 2017-04-04 NOTE — ED Notes (Signed)
Bed: TX64 Expected date:  Expected time:  Means of arrival:  Comments: 52 yo M/Alcohol withdrawal

## 2017-04-05 LAB — COMPREHENSIVE METABOLIC PANEL
ALBUMIN: 3.9 g/dL (ref 3.5–5.0)
ALT: 41 U/L (ref 17–63)
AST: 87 U/L — AB (ref 15–41)
Alkaline Phosphatase: 110 U/L (ref 38–126)
Anion gap: 11 (ref 5–15)
BUN: 8 mg/dL (ref 6–20)
CHLORIDE: 107 mmol/L (ref 101–111)
CO2: 22 mmol/L (ref 22–32)
CREATININE: 0.67 mg/dL (ref 0.61–1.24)
Calcium: 8.8 mg/dL — ABNORMAL LOW (ref 8.9–10.3)
GFR calc Af Amer: >60 mL/min (ref >60)
GLUCOSE: 109 mg/dL — AB (ref 65–99)
Potassium: 3.7 mmol/L (ref 3.5–5.1)
Sodium: 140 mmol/L (ref 135–145)
Total Bilirubin: 0.5 mg/dL (ref 0.3–1.2)
Total Protein: 7.2 g/dL (ref 6.5–8.1)

## 2017-04-05 LAB — ETHANOL: Alcohol, Ethyl (B): 333 mg/dL (ref <10)

## 2017-04-05 LAB — LIPASE, BLOOD: Lipase: 55 U/L — ABNORMAL HIGH (ref 11–51)

## 2017-04-05 MED ORDER — OMEPRAZOLE 20 MG PO CPDR: 20.0000 mg | DELAYED_RELEASE_CAPSULE | Freq: Every day | ORAL | 0 refills | Status: DC

## 2017-04-07 ENCOUNTER — Other Ambulatory Visit: Payer: Self-pay

## 2017-04-07 ENCOUNTER — Encounter (HOSPITAL_COMMUNITY): Payer: Self-pay | Admitting: Emergency Medicine

## 2017-04-07 ENCOUNTER — Emergency Department (HOSPITAL_COMMUNITY)
Admission: EM | Admit: 2017-04-07 | Discharge: 2017-04-07 | Discharge status: Against Medical Advice | Payer: Self-pay | Attending: Emergency Medicine | Admitting: Emergency Medicine

## 2017-04-07 DIAGNOSIS — Z5321 Procedure and treatment not carried out due to patient leaving prior to being seen by health care provider: Secondary | ICD-10-CM | POA: Insufficient documentation

## 2017-04-07 DIAGNOSIS — M549 Dorsalgia, unspecified: Secondary | ICD-10-CM | POA: Insufficient documentation

## 2017-04-07 NOTE — ED Notes (Signed)
Pt does not want to stay. This tech encouraged pt to stay and informed him to please come back if symptoms change or worsen

## 2017-04-09 ENCOUNTER — Encounter (HOSPITAL_COMMUNITY): Payer: Self-pay

## 2017-04-09 ENCOUNTER — Other Ambulatory Visit: Payer: Self-pay

## 2017-04-09 ENCOUNTER — Emergency Department (HOSPITAL_COMMUNITY)
Admission: EM | Admit: 2017-04-09 | Discharge: 2017-04-09 | Disposition: A | Discharge status: Home / Self Care | Payer: Self-pay | Attending: Emergency Medicine | Admitting: Emergency Medicine

## 2017-04-09 DIAGNOSIS — F10929 Alcohol use, unspecified with intoxication, unspecified: Secondary | ICD-10-CM | POA: Insufficient documentation

## 2017-04-09 DIAGNOSIS — Z79899 Other long term (current) drug therapy: Secondary | ICD-10-CM | POA: Insufficient documentation

## 2017-04-09 DIAGNOSIS — F17210 Nicotine dependence, cigarettes, uncomplicated: Secondary | ICD-10-CM | POA: Insufficient documentation

## 2017-04-09 DIAGNOSIS — I10 Essential (primary) hypertension: Secondary | ICD-10-CM | POA: Insufficient documentation

## 2017-04-09 DIAGNOSIS — F10920 Alcohol use, unspecified with intoxication, uncomplicated: Secondary | ICD-10-CM

## 2017-04-09 LAB — BASIC METABOLIC PANEL
Anion gap: 11 (ref 5–15)
BUN: 5 mg/dL — ABNORMAL LOW (ref 6–20)
CHLORIDE: 107 mmol/L (ref 101–111)
CO2: 20 mmol/L — ABNORMAL LOW (ref 22–32)
Calcium: 8.2 mg/dL — ABNORMAL LOW (ref 8.9–10.3)
Creatinine, Ser: 0.66 mg/dL (ref 0.61–1.24)
GFR calc Af Amer: >60 mL/min (ref >60)
GFR calc non Af Amer: >60 mL/min (ref >60)
GLUCOSE: 92 mg/dL (ref 65–99)
POTASSIUM: 3.8 mmol/L (ref 3.5–5.1)
Sodium: 138 mmol/L (ref 135–145)

## 2017-04-09 LAB — CBC
HEMATOCRIT: 30.2 % — AB (ref 39.0–52.0)
HEMOGLOBIN: 9.1 g/dL — AB (ref 13.0–17.0)
MCH: 22.6 pg — ABNORMAL LOW (ref 26.0–34.0)
MCHC: 30.1 g/dL (ref 30.0–36.0)
MCV: 74.9 fL — AB (ref 78.0–100.0)
Platelets: 178 10*3/uL (ref 150–400)
RBC: 4.03 MIL/uL — ABNORMAL LOW (ref 4.22–5.81)
RDW: 19.5 % — AB (ref 11.5–15.5)
WBC: 4.3 10*3/uL (ref 4.0–10.5)

## 2017-04-09 LAB — URINALYSIS, ROUTINE W REFLEX MICROSCOPIC
Bilirubin Urine: NEGATIVE
GLUCOSE, UA: NEGATIVE mg/dL
Hgb urine dipstick: NEGATIVE
KETONES UR: NEGATIVE mg/dL
LEUKOCYTES UA: NEGATIVE
Nitrite: NEGATIVE
PH: 6 (ref 5.0–8.0)
Protein, ur: NEGATIVE mg/dL
SPECIFIC GRAVITY, URINE: 1.001 — AB (ref 1.005–1.030)

## 2017-04-09 LAB — ETHANOL: Alcohol, Ethyl (B): 386 mg/dL (ref <10)

## 2017-04-09 MED ORDER — PANTOPRAZOLE SODIUM 40 MG IV SOLR
40.0000 mg | Freq: Once | INTRAVENOUS | Status: AC
  Administered 2017-04-09: 40 mg via INTRAVENOUS
  Filled 2017-04-09: qty 40

## 2017-04-09 MED ORDER — CHLORDIAZEPOXIDE HCL 25 MG PO CAPS: ORAL_CAPSULE | ORAL | 0 refills | Status: DC

## 2017-04-09 MED ORDER — THIAMINE HCL 100 MG/ML IJ SOLN
100.0000 mg | Freq: Once | INTRAMUSCULAR | Status: AC
  Administered 2017-04-09: 1 mg via INTRAVENOUS
  Filled 2017-04-09: qty 2

## 2017-04-09 MED ORDER — SODIUM CHLORIDE 0.9 % IV BOLUS (SEPSIS)
1000.0000 mL | Freq: Once | INTRAVENOUS | Status: AC
  Administered 2017-04-09: 1000 mL via INTRAVENOUS

## 2017-04-09 NOTE — ED Triage Notes (Signed)
Pt presents via [facility] for evaluation of near syncopal episode and tremors. Patient seen on 1/14 for similar, however, ems reports when they attempted to stand patient he lost radial pulses and had increased tremors. BP 767 systolic while supine. Patient endorses ETOH use routinely.

## 2017-04-09 NOTE — Discharge Instructions (Signed)
It was my pleasure taking care of you today!   Librium will help prevent alcohol withdrawal symptoms if you decide to cut back on your drinking. I highly encourage you to do so. Please follow up with your primary care provider. If you do not have one, please see the information below. Return to ER for new or worsening symptoms, any additional concerns.   To find a primary care or specialty doctor please call [phone] or [phone] to access "[facility]."  You may also go on the [facility] website at [email or web address]  There are also multiple [facility] and [facility] practices throughout the [place] that are frequently accepting new patients. You may find a clinic that is close to your home and contact them.  [place] [address] [phone]  [facility] in [place] (also locations in [place] and [place]) - [place] [phone]  [name] [phone]

## 2017-04-09 NOTE — ED Provider Notes (Signed)
[facility] EMERGENCY DEPARTMENT Provider Note   CSN: [id number] Arrival date & time: 04/09/17  1121     History   Chief Complaint Chief Complaint  Patient presents with  . Near Syncope    HPI Barry Moore is a 52 y.o. male.  The history is provided by the patient and medical records. No language interpreter was used.  Near Syncope    Barry Moore is a 52 y.o. male  with a PMH of ETOH abuse, seizures who presents to the Emergency Department complaining of seizure-like-activity prior to arrival. Per patient, he was with a friend who noticed he started shaking very badly. He drank "2 or 3 40's" and shaking resolved. He is concerned that he had a seizure and called EMS. His last drink prior to today was yesterday - he states that he had several liquor drinks and beer throughout the day yesterday. Denies head injury. No head aches. No nausea, vomiting or blood in the stool. No abdominal pain. No medications taken prior to arrival for symptoms.  Past Medical History:  Diagnosis Date  . Alcoholic hepatitis   . Depression   . ETOH abuse   . Hypertension   . Pancreatitis   . Seizures [facility])     Patient Active Problem List   Diagnosis Date Noted  . Anemia 03/18/2017  . Acute blood loss anemia 02/21/2017  . UGI bleed 02/08/2017  . Alcohol withdrawal ([place]) 02/08/2017  . Cocaine abuse ([place]) 11/18/2016  . GI bleed 11/01/2016  . Alcohol abuse   . 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 Seizure ([place]) 05/26/2016  . Tobacco abuse 05/26/2016  . Homeless 05/26/2016  . Essential hypertension 05/26/2016  . Alcohol dependence with alcohol-induced mood disorder ([place]) 02/14/2016  . Severe alcohol withdrawal without perceptual disturbances without complication ([place]) 90/24/0973  . Alcoholic hepatitis without ascites 02/14/2016  . Thrombocytopenia ([place]) 02/14/2016     Past Surgical History:  Procedure Laterality Date  . ESOPHAGOGASTRODUODENOSCOPY (EGD) WITH PROPOFOL N/A 11/02/2016   Procedure: ESOPHAGOGASTRODUODENOSCOPY (EGD) WITH PROPOFOL;  Surgeon: Carol Ada, MD;  Location: [facility] ENDOSCOPY;  Service: Endoscopy;  Laterality: N/A;  . HERNIA REPAIR    . LEG SURGERY         Home Medications    Prior to Admission medications   Medication Sig Start Date End Date Taking? Authorizing Provider  chlordiazePOXIDE (LIBRIUM) 25 MG capsule 50mg  PO TID x 1D, then 25-50mg  PO BID X 1D, then 25-50mg  PO QD X 1D 04/09/17   Epiphany Seltzer, [name], PA-C  docusate sodium (COLACE) 100 MG capsule Take 1 capsule (100 mg total) by mouth daily. Patient taking differently: Take 100 mg by mouth daily as needed for mild constipation or moderate constipation.  02/28/17   Little, Wenda Overland, MD  ferrous sulfate 325 (65 FE) MG tablet Take 1 tablet (325 mg total) by mouth daily. 02/28/17   Little, Wenda Overland, MD  folic acid (FOLVITE) 1 MG tablet Take 1 tablet (1 mg total) by mouth daily. 03/20/17   Robbie Lis, MD  gabapentin (NEURONTIN) 300 MG capsule Take 1 capsule (300 mg total) by mouth 3 (three) times daily. Patient taking differently: Take 300 mg by mouth 3 (three) times daily as needed (pain).  12/30/16   Ethelene Hal, NP  hydrOXYzine (ATARAX/VISTARIL) 50 MG tablet Take 1 tablet (50 mg total) by mouth 3 (three) times daily. Patient taking differently: Take 50 mg by mouth  every 8 (eight) hours as needed for anxiety.  12/30/16   Ethelene Hal, NP  omeprazole (PRILOSEC) 20 MG capsule Take 1 capsule (20 mg total) by mouth daily. 04/05/17   Horton, Barbette Hair, MD  ondansetron (ZOFRAN ODT) 4 MG disintegrating tablet Take 1 tablet (4 mg total) by mouth every 8 (eight) hours as needed for nausea or vomiting. Patient not taking: Reported on 04/04/2017 03/26/17   Delia Heady, PA-C  pantoprazole (PROTONIX) 40 MG tablet Take 1 tablet (40 mg total) by mouth 2 (two)  times daily. Patient taking differently: Take 40 mg by mouth 2 (two) times daily as needed (reflux).  02/11/17   Charlynne Cousins, MD  thiamine 100 MG tablet Take 1 tablet (100 mg total) by mouth daily. 03/20/17   Robbie Lis, MD  traZODone (DESYREL) 50 MG tablet Take 1 tablet (50 mg total) by mouth at bedtime. Patient taking differently: Take 50 mg by mouth at bedtime as needed for sleep.  12/30/16   Ethelene Hal, NP    Family History Family History  Problem Relation Age of Onset  . Diabetes Mother   . Alcoholism Mother   . Emphysema Father   . Lung cancer Father   . Alcoholism Father     Social History Social History   Tobacco Use  . Smoking status: Current Every Day Smoker    Packs/day: 1.00    Types: Cigarettes  . Smokeless tobacco: Never Used  Substance Use Topics  . Alcohol use: Yes    Comment: 4-5 40oz beers daily  . Drug use: Yes    Frequency: 3.0 times per week    Types: Cocaine    Comment: last use today 2 weeks ago (04-09-17)      Allergies   Tomato and Aspirin   Review of Systems Review of Systems  Cardiovascular: Positive for near-syncope.  Neurological: Positive for tremors and seizures.  All other systems reviewed and are negative.    Physical Exam Updated Vital Signs BP 124/82   Pulse 87   Temp 98.2 F (36.8 C) (Oral)   Resp (!) 21   SpO2 99%   Physical Exam  Constitutional: He is oriented to person, place, and time. He appears well-developed and well-nourished. No distress.  HENT:  Head: Normocephalic and atraumatic.  Cardiovascular: Normal rate, regular rhythm and normal heart sounds.  No murmur heard. Pulmonary/Chest: Effort normal and breath sounds normal. No respiratory distress.  Abdominal: Soft. He exhibits no distension. There is no tenderness.  Neurological: He is alert and oriented to person, place, and time.  A&Ox3. Speech goal oriented. CN 2-12 grossly intact. No drift. Strength and sensation intact.  Skin:  Skin is warm and dry.  Nursing note and vitals reviewed.    ED Treatments / Results  Labs (all labs ordered are listed, but only abnormal results are displayed) Labs Reviewed  BASIC METABOLIC PANEL - Abnormal; Notable for the following components:      Result Value   CO2 20 (*)    BUN 5 (*)    Calcium 8.2 (*)    All other components within normal limits  CBC - Abnormal; Notable for the following components:   RBC 4.03 (*)    Hemoglobin 9.1 (*)    HCT 30.2 (*)    MCV 74.9 (*)    MCH 22.6 (*)    RDW 19.5 (*)    All other components within normal limits  URINALYSIS, ROUTINE W REFLEX MICROSCOPIC - Abnormal; Notable for  the following components:   Color, Urine COLORLESS (*)    Specific Gravity, Urine 1.001 (*)    All other components within normal limits  ETHANOL - Abnormal; Notable for the following components:   Alcohol, Ethyl (B) 386 (*)    All other components within normal limits  CBG MONITORING, ED    EKG  EKG Interpretation  Date/Time:  Saturday April 09 2017 11:39:57 EST Ventricular Rate:  85 PR Interval:    QRS Duration: 99 QT Interval:  375 QTC Calculation: 446 R Axis:   86 Text Interpretation:  Sinus rhythm Confirmed by Tanna Furry [phone]) on 04/09/2017 12:19:52 PM Also confirmed by Tanna Furry [phone]), editor Laurena Spies [phone])  on 04/09/2017 1:13:13 PM       Radiology No results found.  Procedures Procedures (including critical care time)  Medications Ordered in ED Medications  sodium chloride 0.9 % bolus 1,000 mL (1,000 mLs Intravenous New Bag/Given 04/09/17 1322)  thiamine (B-1) injection 100 mg (1 mg Intravenous Given 04/09/17 1323)  pantoprazole (PROTONIX) injection 40 mg (40 mg Intravenous Given 04/09/17 1323)     Initial Impression / Assessment and Plan / ED Course  I have reviewed the triage vital signs and the nursing notes.  Pertinent labs & imaging results that were available during my care of the patient were reviewed by me and  considered in my medical decision making (see chart for details).    Barry Moore is a 52 y.o. male who presents to ED for possible seizure and alcohol intoxication. Labs reviewed: ETOH of 386, otherwise baseline. Patient remained stable in the emergency room. He did not have any seizure activity and remained stable. No signs of DTs. Eating and drinking fine.  Will discharge home with a prescription for Librium once able to safely ambulate with nursing staff.    Final Clinical Impressions(s) / ED Diagnoses   Final diagnoses:  Alcoholic intoxication without complication [facility])    ED Discharge Orders        Ordered    chlordiazePOXIDE (LIBRIUM) 25 MG capsule     04/09/17 1509       Tatiyanna Lashley, [name], PA-C 04/09/17 1524    Tanna Furry, MD 04/10/17 2145

## 2017-04-14 ENCOUNTER — Emergency Department (HOSPITAL_COMMUNITY)
Admission: EM | Admit: 2017-04-14 | Discharge: 2017-04-14 | Disposition: A | Discharge status: Home / Self Care | Payer: Self-pay | Attending: Emergency Medicine | Admitting: Emergency Medicine

## 2017-04-14 ENCOUNTER — Encounter (HOSPITAL_COMMUNITY): Payer: Self-pay | Admitting: Emergency Medicine

## 2017-04-14 DIAGNOSIS — F10929 Alcohol use, unspecified with intoxication, unspecified: Secondary | ICD-10-CM | POA: Insufficient documentation

## 2017-04-14 DIAGNOSIS — Z5321 Procedure and treatment not carried out due to patient leaving prior to being seen by health care provider: Secondary | ICD-10-CM | POA: Insufficient documentation

## 2017-04-14 NOTE — ED Triage Notes (Signed)
Per EMS-states he is having tremors-ETOH since 0700-states he hasn't eaten and complaining of lower back pain

## 2017-04-16 ENCOUNTER — Emergency Department (HOSPITAL_COMMUNITY)
Admission: EM | Admit: 2017-04-16 | Discharge: 2017-04-16 | Disposition: A | Discharge status: Home / Self Care | Payer: Self-pay | Attending: Emergency Medicine | Admitting: Emergency Medicine

## 2017-04-16 ENCOUNTER — Other Ambulatory Visit: Payer: Self-pay

## 2017-04-16 ENCOUNTER — Encounter (HOSPITAL_COMMUNITY): Payer: Self-pay | Admitting: *Deleted

## 2017-04-16 DIAGNOSIS — F10929 Alcohol use, unspecified with intoxication, unspecified: Secondary | ICD-10-CM | POA: Insufficient documentation

## 2017-04-16 DIAGNOSIS — Z5321 Procedure and treatment not carried out due to patient leaving prior to being seen by health care provider: Secondary | ICD-10-CM | POA: Insufficient documentation

## 2017-04-16 NOTE — ED Notes (Signed)
Was about to obtain Pt's vitals in lobby. Pt became irritated and got up and stormed out of the lobby. Pt stated he was leaving, not sure if Pt actually left or not. Have not seen Pt reenter lobby.

## 2017-04-16 NOTE — ED Notes (Signed)
Pt knife given to security to lockup until d/c

## 2017-04-16 NOTE — ED Triage Notes (Signed)
Friend called EMS stating pt was having seizures. He was noted to be rolling on ground without s/s of true seizures and was not post ictal. ETOH involved.

## 2017-04-22 ENCOUNTER — Emergency Department (HOSPITAL_COMMUNITY)
Admission: EM | Admit: 2017-04-22 | Discharge: 2017-04-23 | Disposition: A | Discharge status: Home / Self Care | Payer: Self-pay | Attending: Emergency Medicine | Admitting: Emergency Medicine

## 2017-04-22 ENCOUNTER — Encounter (HOSPITAL_COMMUNITY): Payer: Self-pay | Admitting: Emergency Medicine

## 2017-04-22 ENCOUNTER — Other Ambulatory Visit: Payer: Self-pay

## 2017-04-22 DIAGNOSIS — D509 Iron deficiency anemia, unspecified: Secondary | ICD-10-CM | POA: Insufficient documentation

## 2017-04-22 DIAGNOSIS — Z79899 Other long term (current) drug therapy: Secondary | ICD-10-CM | POA: Insufficient documentation

## 2017-04-22 DIAGNOSIS — F1410 Cocaine abuse, uncomplicated: Secondary | ICD-10-CM | POA: Insufficient documentation

## 2017-04-22 DIAGNOSIS — F332 Major depressive disorder, recurrent severe without psychotic features: Secondary | ICD-10-CM | POA: Insufficient documentation

## 2017-04-22 DIAGNOSIS — R7401 Elevation of levels of liver transaminase levels: Secondary | ICD-10-CM

## 2017-04-22 DIAGNOSIS — R740 Nonspecific elevation of levels of transaminase and lactic acid dehydrogenase [LDH]: Secondary | ICD-10-CM | POA: Insufficient documentation

## 2017-04-22 DIAGNOSIS — F17210 Nicotine dependence, cigarettes, uncomplicated: Secondary | ICD-10-CM | POA: Insufficient documentation

## 2017-04-22 DIAGNOSIS — I10 Essential (primary) hypertension: Secondary | ICD-10-CM | POA: Insufficient documentation

## 2017-04-22 DIAGNOSIS — F10120 Alcohol abuse with intoxication, uncomplicated: Secondary | ICD-10-CM | POA: Insufficient documentation

## 2017-04-22 LAB — COMPREHENSIVE METABOLIC PANEL
ALBUMIN: 4 g/dL (ref 3.5–5.0)
ALT: 104 U/L — ABNORMAL HIGH (ref 17–63)
AST: 195 U/L — AB (ref 15–41)
Alkaline Phosphatase: 125 U/L (ref 38–126)
Anion gap: 14 (ref 5–15)
BUN: 6 mg/dL (ref 6–20)
CHLORIDE: 103 mmol/L (ref 101–111)
CO2: 20 mmol/L — AB (ref 22–32)
CREATININE: 0.98 mg/dL (ref 0.61–1.24)
Calcium: 8.9 mg/dL (ref 8.9–10.3)
GFR calc non Af Amer: >60 mL/min (ref >60)
GLUCOSE: 116 mg/dL — AB (ref 65–99)
Potassium: 3.6 mmol/L (ref 3.5–5.1)
SODIUM: 137 mmol/L (ref 135–145)
Total Bilirubin: 0.5 mg/dL (ref 0.3–1.2)
Total Protein: 7.3 g/dL (ref 6.5–8.1)

## 2017-04-22 LAB — RAPID URINE DRUG SCREEN, HOSP PERFORMED
AMPHETAMINES: NOT DETECTED
BARBITURATES: NOT DETECTED
BENZODIAZEPINES: POSITIVE — AB
Cocaine: POSITIVE — AB
Opiates: NOT DETECTED
Tetrahydrocannabinol: NOT DETECTED

## 2017-04-22 LAB — CBC
HEMATOCRIT: 30.9 % — AB (ref 39.0–52.0)
Hemoglobin: 9.4 g/dL — ABNORMAL LOW (ref 13.0–17.0)
MCH: 22.2 pg — AB (ref 26.0–34.0)
MCHC: 30.4 g/dL (ref 30.0–36.0)
MCV: 73 fL — AB (ref 78.0–100.0)
Platelets: 179 10*3/uL (ref 150–400)
RBC: 4.23 MIL/uL (ref 4.22–5.81)
RDW: 19.4 % — AB (ref 11.5–15.5)
WBC: 4.7 10*3/uL (ref 4.0–10.5)

## 2017-04-22 LAB — ETHANOL: Alcohol, Ethyl (B): 368 mg/dL (ref <10)

## 2017-04-22 MED ORDER — LORAZEPAM 1 MG PO TABS: 0.0000 mg | ORAL_TABLET | Freq: Two times a day (BID) | ORAL | Status: DC

## 2017-04-22 MED ORDER — LORAZEPAM 2 MG/ML IJ SOLN
0.0000 mg | Freq: Four times a day (QID) | INTRAMUSCULAR | Status: DC
  Administered 2017-04-23: 1 mg via INTRAVENOUS
  Filled 2017-04-22: qty 1

## 2017-04-22 MED ORDER — SODIUM CHLORIDE 0.9 % IV BOLUS (SEPSIS)
1000.0000 mL | Freq: Once | INTRAVENOUS | Status: AC
  Administered 2017-04-23: 1000 mL via INTRAVENOUS

## 2017-04-22 MED ORDER — VITAMIN B-1 100 MG PO TABS: 100.0000 mg | ORAL_TABLET | Freq: Every day | ORAL | Status: DC

## 2017-04-22 MED ORDER — THIAMINE HCL 100 MG/ML IJ SOLN: 100.0000 mg | Freq: Every day | INTRAMUSCULAR | Status: DC

## 2017-04-22 MED ORDER — LORAZEPAM 2 MG/ML IJ SOLN: 0.0000 mg | Freq: Two times a day (BID) | INTRAMUSCULAR | Status: DC

## 2017-04-22 MED ORDER — LORAZEPAM 1 MG PO TABS: 0.0000 mg | ORAL_TABLET | Freq: Four times a day (QID) | ORAL | Status: DC

## 2017-04-22 NOTE — ED Notes (Signed)
ED Provider at bedside. 

## 2017-04-22 NOTE — ED Triage Notes (Signed)
Pt to ED stating he wants detox for ETOH.  Pt st's he has had 2-3 pints of liquor today plus 2-3 40oz beers.  Pt is homeless.

## 2017-04-22 NOTE — ED Notes (Signed)
Pt requesting something to eat. Ok per [employer] pt given a [place] sandwich, apple sauce, graham crackers, peanut butter and a coke to drink

## 2017-04-22 NOTE — ED Provider Notes (Addendum)
[facility] EMERGENCY DEPARTMENT Provider Note   CSN: [id number] Arrival date & time: 04/22/17  2124     History   Chief Complaint Chief Complaint  Patient presents with  . Medical Clearance    HPI Barry Moore is a 52 y.o. male.  The history is provided by the patient and a caregiver. The history is limited by the condition of the patient (Severe intoxication).  He is brought in by a case manager because he is wanting to go through detox for alcohol.  He had consumed several pints of liquor today.  Case manager states that he does have history of alcohol withdrawal seizures.  He had attempted to call [facility], but they were not answering the phones.  Patient is sleeping and able to grunt when aroused, but not able to actually answer any questions.  Past Medical History:  Diagnosis Date  . Alcoholic hepatitis   . Depression   . ETOH abuse   . Hypertension   . Pancreatitis   . Seizures [facility])     Patient Active Problem List   Diagnosis Date Noted  . Anemia 03/18/2017  . Acute blood loss anemia 02/21/2017  . UGI bleed 02/08/2017  . Alcohol withdrawal ([place]) 02/08/2017  . Cocaine abuse ([place]) 11/18/2016  . GI bleed 11/01/2016  . Alcohol abuse   . Major depressive disorder, recurrent severe without psychotic features ([place]) 07/06/2016  . 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 Seizure ([place]) 05/26/2016  . Tobacco abuse 05/26/2016  . Homeless 05/26/2016  . Essential hypertension 05/26/2016  . Alcohol dependence with alcohol-induced mood disorder ([place]) 02/14/2016  . Severe alcohol withdrawal without perceptual disturbances without complication ([place]) 61/60/7371  . Alcoholic hepatitis without ascites 02/14/2016  . Thrombocytopenia ([place]) 02/14/2016    Past Surgical History:  Procedure Laterality Date  . ESOPHAGOGASTRODUODENOSCOPY (EGD) WITH PROPOFOL N/A 11/02/2016   Procedure: ESOPHAGOGASTRODUODENOSCOPY (EGD) WITH PROPOFOL;  Surgeon: Carol Ada,  MD;  Location: [facility] ENDOSCOPY;  Service: Endoscopy;  Laterality: N/A;  . HERNIA REPAIR    . LEG SURGERY         Home Medications    Prior to Admission medications   Medication Sig Start Date End Date Taking? Authorizing Provider  chlordiazePOXIDE (LIBRIUM) 25 MG capsule 50mg  PO TID x 1D, then 25-50mg  PO BID X 1D, then 25-50mg  PO QD X 1D 04/09/17   [place]ard, [name], PA-C  docusate sodium (COLACE) 100 MG capsule Take 1 capsule (100 mg total) by mouth daily. Patient taking differently: Take 100 mg by mouth daily as needed for mild constipation or moderate constipation.  02/28/17   Little, Wenda Overland, MD  ferrous sulfate 325 (65 FE) MG tablet Take 1 tablet (325 mg total) by mouth daily. 02/28/17   Little, Wenda Overland, MD  folic acid (FOLVITE) 1 MG tablet Take 1 tablet (1 mg total) by mouth daily. 03/20/17   Robbie Lis, MD  gabapentin (NEURONTIN) 300 MG capsule Take 1 capsule (300 mg total) by mouth 3 (three) times daily. Patient taking differently: Take 300 mg by mouth 3 (three) times daily as needed (pain).  12/30/16   Ethelene Hal, NP  hydrOXYzine (ATARAX/VISTARIL) 50 MG tablet Take 1 tablet (50 mg total) by mouth 3 (three) times daily. Patient taking differently: Take 50 mg by mouth every 8 (eight) hours as needed for anxiety.  12/30/16   Ethelene Hal, NP  omeprazole (PRILOSEC) 20 MG capsule Take 1 capsule (20 mg total) by mouth daily. 04/05/17   [place],  Barbette Hair, MD  ondansetron (ZOFRAN ODT) 4 MG disintegrating tablet Take 1 tablet (4 mg total) by mouth every 8 (eight) hours as needed for nausea or vomiting. Patient not taking: Reported on 04/04/2017 03/26/17   Delia Heady, PA-C  pantoprazole (PROTONIX) 40 MG tablet Take 1 tablet (40 mg total) by mouth 2 (two) times daily. Patient taking differently: Take 40 mg by mouth 2 (two) times daily as needed (reflux).  02/11/17   Charlynne Cousins, MD  thiamine 100 MG tablet Take 1 tablet (100 mg total) by mouth daily.  03/20/17   Robbie Lis, MD  traZODone (DESYREL) 50 MG tablet Take 1 tablet (50 mg total) by mouth at bedtime. Patient taking differently: Take 50 mg by mouth at bedtime as needed for sleep.  12/30/16   Ethelene Hal, NP    Family History Family History  Problem Relation Age of Onset  . Diabetes Mother   . Alcoholism Mother   . Emphysema Father   . Lung cancer Father   . Alcoholism Father     Social History Social History   Tobacco Use  . Smoking status: Current Every Day Smoker    Packs/day: 1.00    Types: Cigarettes  . Smokeless tobacco: Never Used  Substance Use Topics  . Alcohol use: Yes    Comment: 4-5 40oz beers daily  . Drug use: Yes    Frequency: 3.0 times per week    Types: Cocaine    Comment: last use today 2 weeks ago (04-09-17)      Allergies   Tomato and Aspirin   Review of Systems Review of Systems  Unable to perform ROS: Mental status change     Physical Exam Updated Vital Signs BP 138/81 (BP Location: Right Arm)   Pulse (!) 112   Temp 98.7 F (37.1 C) (Oral)   Resp 18   SpO2 96%   Physical Exam  Nursing note and vitals reviewed.  52 year old male, resting comfortably and in no acute distress. Vital signs are significant for tachycardia. Oxygen saturation is 96%, which is normal.  He is somnolent but arousable. Head is normocephalic and atraumatic. PERRLA, EOMI. Oropharynx is clear. Neck is nontender and supple without adenopathy or JVD. Back is nontender and there is no CVA tenderness. Lungs are clear without rales, wheezes, or rhonchi. Chest is nontender. Heart has regular rate and rhythm without murmur. Abdomen is soft, flat, nontender without masses or hepatosplenomegaly and peristalsis is normoactive. Extremities have no cyanosis or edema, full range of motion is present. Skin is warm and dry without rash. Neurologic: He is somnolent but arousable but will not speak or follow commands, cranial nerves are intact, there are  no motor or sensory deficits.  ED Treatments / Results  Labs (all labs ordered are listed, but only abnormal results are displayed) Labs Reviewed  COMPREHENSIVE METABOLIC PANEL - Abnormal; Notable for the following components:      Result Value   CO2 20 (*)    Glucose, Bld 116 (*)    AST 195 (*)    ALT 104 (*)    All other components within normal limits  ETHANOL - Abnormal; Notable for the following components:   Alcohol, Ethyl (B) 368 (*)    All other components within normal limits  CBC - Abnormal; Notable for the following components:   Hemoglobin 9.4 (*)    HCT 30.9 (*)    MCV 73.0 (*)    MCH 22.2 (*)  RDW 19.4 (*)    All other components within normal limits  RAPID URINE DRUG SCREEN, HOSP PERFORMED - Abnormal; Notable for the following components:   Cocaine POSITIVE (*)    Benzodiazepines POSITIVE (*)    All other components within normal limits     Procedures Procedures (including critical care time)  Medications Ordered in ED Medications  LORazepam (ATIVAN) injection 0-4 mg (0 mg Intravenous Not Given 04/23/17 0615)    Or  LORazepam (ATIVAN) tablet 0-4 mg ( Oral See Alternative 04/23/17 0615)  LORazepam (ATIVAN) injection 0-4 mg (not administered)    Or  LORazepam (ATIVAN) tablet 0-4 mg (not administered)  thiamine (VITAMIN B-1) tablet 100 mg (not administered)    Or  thiamine (B-1) injection 100 mg (not administered)  nicotine (NICODERM CQ - dosed in mg/24 hours) patch 14 mg (not administered)  alum & mag hydroxide-simeth (MAALOX/MYLANTA) 200-200-20 MG/5ML suspension 30 mL (not administered)  ondansetron (ZOFRAN) tablet 4 mg (not administered)  acetaminophen (TYLENOL) tablet 650 mg (not administered)  gabapentin (NEURONTIN) capsule 300 mg (not administered)  pantoprazole (PROTONIX) EC tablet 40 mg (not administered)  hydrOXYzine (ATARAX/VISTARIL) tablet 50 mg (not administered)  sodium chloride 0.9 % bolus 1,000 mL (0 mLs Intravenous Stopped 04/23/17 0127)      Initial Impression / Assessment and Plan / ED Course  I have reviewed the triage vital signs and the nursing notes.  Pertinent labs & imaging results that were available during my care of the patient were reviewed by me and considered in my medical decision making (see chart for details).  Severe alcohol intoxication.  Patient is too intoxicated currently to give any history.  Old records are reviewed, and he does have history of depression, multiple ED visits for alcohol intoxication, admissions for upper GI bleed secondary to gastric ulcer and portal gastropathy, and also an admission for alcohol withdrawal seizure.  Laboratory workup shows severely elevated alcohol level, elevation of transaminases which is significantly increased from January 14, chronic anemia which is unchanged from baseline.  Drug screen is positive for benzodiazepines, cocaine.  Since he is tachycardic, he will be given IV fluids.  He will be need to be kept in the ED in July can do a proper interview.  Given history of alcohol withdrawal seizures, he is prophylactically started on CIWA protocol.  7:11 AM Patient has slept through the night.  I have gone back to interview him and he confirms history of alcohol abuse and desire for going through detox.  He admits to severe depression with crying spells and anhedonia.  He also admits to some auditory hallucinations but no command hallucinations.  He denies suicidal ideation.  He was offered evaluation by TTS to see if he would qualify for inpatient detox.  However, he states that he does not wish to stay and will pursue outpatient detox.  He is given outpatient detox resources.  Advised to return if he changes his mind.  Final Clinical Impressions(s) / ED Diagnoses   Final diagnoses:  Severe episode of recurrent major depressive disorder, with psychotic features ([place])  Alcohol abuse with intoxication, uncomplicated ([place])  Cocaine abuse ([place])  Elevated transaminase level   Microcytic anemia    ED Discharge Orders    None       Delora Fuel, MD 70/62/37 [id number]    Delora Fuel, MD 15/17/61 [phone]

## 2017-04-23 MED ORDER — PANTOPRAZOLE SODIUM 40 MG PO TBEC: 40.0000 mg | DELAYED_RELEASE_TABLET | Freq: Every day | ORAL | Status: DC

## 2017-04-23 MED ORDER — GABAPENTIN 300 MG PO CAPS: 300.0000 mg | ORAL_CAPSULE | Freq: Three times a day (TID) | ORAL | Status: DC | PRN

## 2017-04-23 MED ORDER — NICOTINE 14 MG/24HR TD PT24: 14.0000 mg | MEDICATED_PATCH | Freq: Every day | TRANSDERMAL | Status: DC

## 2017-04-23 MED ORDER — ONDANSETRON HCL 4 MG PO TABS: 4.0000 mg | ORAL_TABLET | Freq: Three times a day (TID) | ORAL | Status: DC | PRN

## 2017-04-23 MED ORDER — ALUM & MAG HYDROXIDE-SIMETH 200-200-20 MG/5ML PO SUSP: 30.0000 mL | Freq: Four times a day (QID) | ORAL | Status: DC | PRN

## 2017-04-23 MED ORDER — HYDROXYZINE HCL 50 MG PO TABS: 50.0000 mg | ORAL_TABLET | Freq: Three times a day (TID) | ORAL | Status: DC | PRN

## 2017-04-23 MED ORDER — ACETAMINOPHEN 325 MG PO TABS: 650.0000 mg | ORAL_TABLET | ORAL | Status: DC | PRN

## 2017-04-23 NOTE — ED Notes (Signed)
Patient stated he did not want any treatment and wants to be discharged immediately. MD notified.

## 2017-04-23 NOTE — ED Notes (Signed)
Per family friend needs a medical release to get pt in a placement.

## 2017-04-26 ENCOUNTER — Other Ambulatory Visit: Payer: Self-pay

## 2017-04-26 ENCOUNTER — Emergency Department (HOSPITAL_COMMUNITY)
Admission: EM | Admit: 2017-04-26 | Discharge: 2017-04-28 | Disposition: A | Discharge status: Home / Self Care | Payer: Self-pay | Attending: Emergency Medicine | Admitting: Emergency Medicine

## 2017-04-26 DIAGNOSIS — F1410 Cocaine abuse, uncomplicated: Secondary | ICD-10-CM | POA: Insufficient documentation

## 2017-04-26 DIAGNOSIS — Z79899 Other long term (current) drug therapy: Secondary | ICD-10-CM | POA: Insufficient documentation

## 2017-04-26 DIAGNOSIS — R740 Nonspecific elevation of levels of transaminase and lactic acid dehydrogenase [LDH]: Secondary | ICD-10-CM | POA: Insufficient documentation

## 2017-04-26 DIAGNOSIS — R45851 Suicidal ideations: Secondary | ICD-10-CM | POA: Insufficient documentation

## 2017-04-26 DIAGNOSIS — Y908 Blood alcohol level of 240 mg/100 ml or more: Secondary | ICD-10-CM | POA: Insufficient documentation

## 2017-04-26 DIAGNOSIS — F1024 Alcohol dependence with alcohol-induced mood disorder: Secondary | ICD-10-CM | POA: Diagnosis present

## 2017-04-26 DIAGNOSIS — D649 Anemia, unspecified: Secondary | ICD-10-CM | POA: Insufficient documentation

## 2017-04-26 DIAGNOSIS — F329 Major depressive disorder, single episode, unspecified: Secondary | ICD-10-CM | POA: Insufficient documentation

## 2017-04-26 DIAGNOSIS — F10120 Alcohol abuse with intoxication, uncomplicated: Secondary | ICD-10-CM | POA: Insufficient documentation

## 2017-04-26 DIAGNOSIS — F17210 Nicotine dependence, cigarettes, uncomplicated: Secondary | ICD-10-CM | POA: Insufficient documentation

## 2017-04-26 DIAGNOSIS — I10 Essential (primary) hypertension: Secondary | ICD-10-CM | POA: Insufficient documentation

## 2017-04-26 LAB — COMPREHENSIVE METABOLIC PANEL
ALBUMIN: 4 g/dL (ref 3.5–5.0)
ALK PHOS: 129 U/L — AB (ref 38–126)
ALT: 80 U/L — ABNORMAL HIGH (ref 17–63)
ANION GAP: 8 (ref 5–15)
AST: 162 U/L — AB (ref 15–41)
BUN: 7 mg/dL (ref 6–20)
CALCIUM: 8.9 mg/dL (ref 8.9–10.3)
CO2: 27 mmol/L (ref 22–32)
Chloride: 106 mmol/L (ref 101–111)
Creatinine, Ser: 0.82 mg/dL (ref 0.61–1.24)
GFR calc Af Amer: >60 mL/min (ref >60)
GFR calc non Af Amer: >60 mL/min (ref >60)
GLUCOSE: 97 mg/dL (ref 65–99)
Potassium: 3.8 mmol/L (ref 3.5–5.1)
SODIUM: 141 mmol/L (ref 135–145)
Total Bilirubin: 0.2 mg/dL — ABNORMAL LOW (ref 0.3–1.2)
Total Protein: 8.1 g/dL (ref 6.5–8.1)

## 2017-04-26 LAB — CBC
HEMATOCRIT: 29.3 % — AB (ref 39.0–52.0)
Hemoglobin: 9.1 g/dL — ABNORMAL LOW (ref 13.0–17.0)
MCH: 22.2 pg — ABNORMAL LOW (ref 26.0–34.0)
MCHC: 31.1 g/dL (ref 30.0–36.0)
MCV: 71.5 fL — AB (ref 78.0–100.0)
Platelets: 184 10*3/uL (ref 150–400)
RBC: 4.1 MIL/uL — ABNORMAL LOW (ref 4.22–5.81)
RDW: 19.4 % — ABNORMAL HIGH (ref 11.5–15.5)
WBC: 5.4 10*3/uL (ref 4.0–10.5)

## 2017-04-26 LAB — ETHANOL: Alcohol, Ethyl (B): 365 mg/dL (ref <10)

## 2017-04-26 LAB — ACETAMINOPHEN LEVEL

## 2017-04-26 LAB — SALICYLATE LEVEL: Salicylate Lvl: <7 mg/dL (ref 2.8–30.0)

## 2017-04-26 NOTE — ED Provider Notes (Signed)
[facility] DEPT Provider Note   CSN: [id number] Arrival date & time: 04/26/17  2138     History   Chief Complaint Chief Complaint  Patient presents with  . Medical Clearance  Level 5 caveat patient with altered mental status  HPI Barry Moore is a 52 y.o. male.  Patient presented to the triage area stating that he wanted to kill himself.  Reportedly [employer] took a knife from the patient.  Presently he is somnolent arousable to tactile stimulus and falls asleep after a few seconds.  He admits to drinking alcohol earlier tonight.  HPI  Past Medical History:  Diagnosis Date  . Alcoholic hepatitis   . Depression   . ETOH abuse   . Hypertension   . Pancreatitis   . Seizures [facility])     Patient Active Problem List   Diagnosis Date Noted  . Anemia 03/18/2017  . Acute blood loss anemia 02/21/2017  . UGI bleed 02/08/2017  . Alcohol withdrawal ([place]) 02/08/2017  . Cocaine abuse ([place]) 11/18/2016  . GI bleed 11/01/2016  . Alcohol abuse   . Major depressive disorder, recurrent severe without psychotic features ([place]) 07/06/2016  . Alcohol-induced mood disorder ([place]) 06/15/2016  . Seizure ([place]) 05/26/2016  . Tobacco abuse 05/26/2016  . Homeless 05/26/2016  . Essential hypertension 05/26/2016  . Alcohol dependence with alcohol-induced mood disorder ([place]) 02/14/2016  . Severe alcohol withdrawal without perceptual disturbances without complication ([place]) 78/24/2353  . Alcoholic hepatitis without ascites 02/14/2016  . Thrombocytopenia ([place]) 02/14/2016    Past Surgical History:  Procedure Laterality Date  . ESOPHAGOGASTRODUODENOSCOPY (EGD) WITH PROPOFOL N/A 11/02/2016   Procedure: ESOPHAGOGASTRODUODENOSCOPY (EGD) WITH PROPOFOL;  Surgeon: Carol Ada, MD;  Location: [facility] ENDOSCOPY;  Service: Endoscopy;  Laterality: N/A;  . HERNIA REPAIR    . LEG SURGERY         Home Medications    Prior to Admission medications   Medication Sig Start Date End Date  Taking? Authorizing Provider  chlordiazePOXIDE (LIBRIUM) 25 MG capsule 50mg  PO TID x 1D, then 25-50mg  PO BID X 1D, then 25-50mg  PO QD X 1D Patient not taking: Reported on 04/26/2017 04/09/17   Ward, [name], PA-C  docusate sodium (COLACE) 100 MG capsule Take 1 capsule (100 mg total) by mouth daily. Patient not taking: Reported on 04/26/2017 02/28/17   Little, Wenda Overland, MD  ferrous sulfate 325 (65 FE) MG tablet Take 1 tablet (325 mg total) by mouth daily. Patient not taking: Reported on 04/26/2017 02/28/17   Little, Wenda Overland, MD  folic acid (FOLVITE) 1 MG tablet Take 1 tablet (1 mg total) by mouth daily. Patient not taking: Reported on 04/26/2017 03/20/17   Robbie Lis, MD  gabapentin (NEURONTIN) 300 MG capsule Take 1 capsule (300 mg total) by mouth 3 (three) times daily. Patient not taking: Reported on 04/26/2017 12/30/16   Ethelene Hal, NP  hydrOXYzine (ATARAX/VISTARIL) 50 MG tablet Take 1 tablet (50 mg total) by mouth 3 (three) times daily. Patient not taking: Reported on 04/26/2017 12/30/16   Ethelene Hal, NP  omeprazole (PRILOSEC) 20 MG capsule Take 1 capsule (20 mg total) by mouth daily. Patient not taking: Reported on 04/26/2017 04/05/17   Horton, Barbette Hair, MD  ondansetron (ZOFRAN ODT) 4 MG disintegrating tablet Take 1 tablet (4 mg total) by mouth every 8 (eight) hours as needed for nausea or vomiting. Patient not taking: Reported on 04/04/2017 03/26/17   Delia Heady, PA-C  pantoprazole (PROTONIX) 40 MG tablet Take 1 tablet (  40 mg total) by mouth 2 (two) times daily. Patient not taking: Reported on 04/26/2017 02/11/17   Charlynne Cousins, MD  thiamine 100 MG tablet Take 1 tablet (100 mg total) by mouth daily. Patient not taking: Reported on 04/26/2017 03/20/17   Robbie Lis, MD  traZODone (DESYREL) 50 MG tablet Take 1 tablet (50 mg total) by mouth at bedtime. Patient not taking: Reported on 04/26/2017 12/30/16   Ethelene Hal, NP    Family History Family  History  Problem Relation Age of Onset  . Diabetes Mother   . Alcoholism Mother   . Emphysema Father   . Lung cancer Father   . Alcoholism Father     Social History Social History   Tobacco Use  . Smoking status: Current Every Day Smoker    Packs/day: 1.00    Types: Cigarettes  . Smokeless tobacco: Never Used  Substance Use Topics  . Alcohol use: Yes    Comment: 4-5 40oz beers daily  . Drug use: Yes    Frequency: 3.0 times per week    Types: Cocaine    Comment: last use today 2 weeks ago (04-09-17)      Allergies   Tomato and Aspirin   Review of Systems Review of Systems  Unable to perform ROS: Mental status change     Physical Exam Updated Vital Signs reviewed, nurse's notes reviewed. SpO2 99%   Physical Exam HEENT exam no facial asymmetry neck supple trachea midline lungs clear to auscultation.  Heart normal rate abdomen nondistended nontender.  All 4 extremities or contusion abrasion or tenderness neurovascular intact.  Skin warm dry  Neurologic exam sleepy arousable to tactile stimulus.  Speech is slurred.  Appears intoxicated, moves all extremities cranial nerves II through XII intact  ED Treatments / Results  Labs (all labs ordered are listed, but only abnormal results are displayed) Labs Reviewed  COMPREHENSIVE METABOLIC PANEL - Abnormal; Notable for the following components:      Result Value   AST 162 (*)    ALT 80 (*)    Alkaline Phosphatase 129 (*)    Total Bilirubin 0.2 (*)    All other components within normal limits  ETHANOL - Abnormal; Notable for the following components:   Alcohol, Ethyl (B) 365 (*)    All other components within normal limits  ACETAMINOPHEN LEVEL - Abnormal; Notable for the following components:   Acetaminophen (Tylenol), Serum <10 (*)    All other components within normal limits  CBC - Abnormal; Notable for the following components:   RBC 4.10 (*)    Hemoglobin 9.1 (*)    HCT 29.3 (*)    MCV 71.5 (*)    MCH 22.2  (*)    RDW 19.4 (*)    All other components within normal limits  SALICYLATE LEVEL  RAPID URINE DRUG SCREEN, HOSP PERFORMED   Results for orders placed or performed during the hospital encounter of 04/26/17  Comprehensive metabolic panel  Result Value Ref Range   Sodium 141 135 - 145 mmol/L   Potassium 3.8 3.5 - 5.1 mmol/L   Chloride 106 101 - 111 mmol/L   CO2 27 22 - 32 mmol/L   Glucose, Bld 97 65 - 99 mg/dL   BUN 7 6 - 20 mg/dL   Creatinine, Ser 0.82 0.61 - 1.24 mg/dL   Calcium 8.9 8.9 - 10.3 mg/dL   Total Protein 8.1 6.5 - 8.1 g/dL   Albumin 4.0 3.5 - 5.0 g/dL   AST  162 (H) 15 - 41 U/L   ALT 80 (H) 17 - 63 U/L   Alkaline Phosphatase 129 (H) 38 - 126 U/L   Total Bilirubin 0.2 (L) 0.3 - 1.2 mg/dL   GFR calc non Af Amer >60 >60 mL/min   GFR calc Af Amer >60 >60 mL/min   Anion gap 8 5 - 15  Ethanol  Result Value Ref Range   Alcohol, Ethyl (B) 365 (HH) <74 mg/dL  Salicylate level  Result Value Ref Range   Salicylate Lvl <1.6 2.8 - 30.0 mg/dL  Acetaminophen level  Result Value Ref Range   Acetaminophen (Tylenol), Serum <10 (L) 10 - 30 ug/mL  cbc  Result Value Ref Range   WBC 5.4 4.0 - 10.5 K/uL   RBC 4.10 (L) 4.22 - 5.81 MIL/uL   Hemoglobin 9.1 (L) 13.0 - 17.0 g/dL   HCT 29.3 (L) 39.0 - 52.0 %   MCV 71.5 (L) 78.0 - 100.0 fL   MCH 22.2 (L) 26.0 - 34.0 pg   MCHC 31.1 30.0 - 36.0 g/dL   RDW 19.4 (H) 11.5 - 15.5 %   Platelets 184 150 - 400 K/uL   No results found.  EKG  EKG Interpretation None       Radiology No results found.  Procedures Procedures (including critical care time)  Medications Ordered in ED Medications - No data to display   Initial Impression / Assessment and Plan / ED Course  I have reviewed the triage vital signs and the nursing notes.  Pertinent labs & imaging results that were available during my care of the patient were reviewed by me and considered in my medical decision making (see chart for details).     At this point patient  is not medically cleared as he is intoxicated.  Patient is anemic however hemoglobin stable and anemia is chronic.  Also has chronically elevated transaminases Patient signed out to Dr.Nanavati 11:45 PM Final Clinical Impressions(s) / ED Diagnoses  Dx#1 acute alcohol intoxication #2 anemia #3 elevated transaminases #4 chronic alcohol abuse  Final diagnoses:  None    ED Discharge Orders    None       Orlie Dakin, MD 04/26/17 2350

## 2017-04-26 NOTE — ED Triage Notes (Signed)
Pt making suicidal statements pulled knife out tonight threatening to cut self and [employer] took blade from Mr Behar. Hx of suicidal ideation for the last 15 yrs

## 2017-04-27 LAB — RAPID URINE DRUG SCREEN, HOSP PERFORMED
AMPHETAMINES: NOT DETECTED
BENZODIAZEPINES: POSITIVE — AB
Barbiturates: NOT DETECTED
Cocaine: POSITIVE — AB
Opiates: NOT DETECTED
Tetrahydrocannabinol: NOT DETECTED

## 2017-04-27 MED ORDER — ZOLPIDEM TARTRATE 5 MG PO TABS: 5.0000 mg | ORAL_TABLET | Freq: Every evening | ORAL | Status: DC | PRN

## 2017-04-27 MED ORDER — HALOPERIDOL 5 MG PO TABS: 5.0000 mg | ORAL_TABLET | Freq: Four times a day (QID) | ORAL | Status: DC | PRN

## 2017-04-27 MED ORDER — ALUM & MAG HYDROXIDE-SIMETH 200-200-20 MG/5ML PO SUSP: 30.0000 mL | Freq: Four times a day (QID) | ORAL | Status: DC | PRN

## 2017-04-27 MED ORDER — ONDANSETRON HCL 4 MG PO TABS
4.0000 mg | ORAL_TABLET | Freq: Three times a day (TID) | ORAL | Status: DC | PRN
Start: 2017-04-27 — End: 2017-04-28

## 2017-04-27 MED ORDER — BENZTROPINE MESYLATE 1 MG PO TABS: 1.0000 mg | ORAL_TABLET | Freq: Four times a day (QID) | ORAL | Status: DC | PRN

## 2017-04-27 NOTE — ED Notes (Signed)
No changes from previous am psych assessment.

## 2017-04-27 NOTE — ED Notes (Signed)
Report given to SAPPU  

## 2017-04-27 NOTE — ED Notes (Signed)
SBAR Report received from previous nurse. Pt received calm and visible on unit. Pt reports he is tired and would like to answer assessment questions later and appears otherwise stable and free of distress. Pt reminded of camera surveillance, q 15 min rounds, and rules of the milieu. Will continue to assess.

## 2017-04-27 NOTE — ED Notes (Signed)
Bed: WBH40 Expected date:  Expected time:  Means of arrival:  Comments: 26 

## 2017-04-27 NOTE — ED Notes (Signed)
Pt refused VS, snack, or any needs at this time. Will continue to assess.

## 2017-04-27 NOTE — BH Assessment (Signed)
Assessment Note  Barry Moore is an 52 y.o. male who came to [facility] after grabbing a knife with intent to cut his wrists but his friend intervened. Pt states that he has been depressed recently and felt suicidal last night. Pt was intoxicated when he pulled out the knife. He denies current suicidal ideations. Pt states that he has no family because they are all dead. He is currently homeless and living in a [place] the woods. He states that he drinks 6-7 40 ounce beers daily and sometimes drinks liquor on top of that. Pt states that he has had seizures and hallucinations from alcohol withdrawal in the past. He believes he might have had a seizure "a couple days ago".  Pt is currently feeling nauseaus from drinking last night. Pt states that he has had "thoughts to harm others that irritate him" but no homicidal thoughts or plan to harm anyone. Pt denies AVH at this time. Pt has been on medication for depression in the past but has not taken anything recently. He does not have an outpatient provider at this time. Pt states that he has been drinking "all his life" and has been to substance abuse treatment but has never been able to stay sober. He states that the only time he was sober was 4.5 years when he was in jail from 2012-2016.   Observe overnight per Dr. Mariea Clonts, peer support consulted.  Diagnosis: Major Depressive Disorder, Recurrent Severe, Alcohol Use Disorder Severe   Past Medical History:  Past Medical History:  Diagnosis Date  . Alcoholic hepatitis   . Depression   . ETOH abuse   . Hypertension   . Pancreatitis   . Seizures ([place])     Past Surgical History:  Procedure Laterality Date  . ESOPHAGOGASTRODUODENOSCOPY (EGD) WITH PROPOFOL N/A 11/02/2016   Procedure: ESOPHAGOGASTRODUODENOSCOPY (EGD) WITH PROPOFOL;  Surgeon: Carol Ada, MD;  [facility];  Service: Endoscopy;  Laterality: N/A;  . HERNIA REPAIR    . LEG SURGERY      Family History:  Family History  Problem  Relation Age of Onset  . Diabetes Mother   . Alcoholism Mother   . Emphysema Father   . Lung cancer Father   . Alcoholism Father     Social History:  reports that he has been smoking cigarettes.  He has been smoking about 1.00 pack per day. he has never used smokeless tobacco. He reports that he drinks alcohol. He reports that he uses drugs. Drug: Cocaine. Frequency: 3.00 times per week.  Additional Social History:  Alcohol / Drug Use Pain Medications: See MAR Prescriptions: See MAR Over the Counter: See MAR History of alcohol / drug use?: Yes Longest period of sobriety (when/how long): none Negative Consequences of Use: Personal relationships, Financial, Work / School Substance #1 Name of Substance 1: Alcohol  1 - Age of First Use: teenager 1 - Amount (size/oz): 6-7 40 oz beers 1 - Frequency: daily  1 - Last Use / Amount: last night  CIWA: CIWA-Ar BP: 115/72 Pulse Rate: 84 Nausea and Vomiting: no nausea and no vomiting Tactile Disturbances: none Tremor: no tremor Auditory Disturbances: not present Paroxysmal Sweats: no sweat visible Visual Disturbances: not present Anxiety: mildly anxious Headache, Fullness in Head: none present Agitation: normal activity Orientation and Clouding of Sensorium: oriented and can do serial additions CIWA-Ar Total: 1 COWS:    Allergies:  Allergies  Allergen Reactions  . Tomato Shortness Of Breath and Nausea And Vomiting  . Aspirin Nausea And  Vomiting    Home Medications:  (Not in a hospital admission)  OB/GYN Status:  No LMP for male patient.  General Assessment Data [facility] of [facility]: [facility] ED TTS Assessment: In system Is this a Tele or Face-to-Face Assessment?: Face-to-Face Is this an Initial Assessment or a Re-assessment for this encounter?: Initial Assessment Marital status: Single Is patient pregnant?: No Pregnancy Status: No Living Arrangements: (homeless) Can pt return to current living arrangement?: Yes Admission  Status: Voluntary Is patient capable of signing voluntary admission?: Yes Referral Source: Self/Family/Friend Insurance type: None     Crisis Care Plan Living Arrangements: (homeless) Legal Guardian: (None) Name of Psychiatrist: None Name of Therapist: None  Education Status Is patient currently in school?: No Highest grade of school patient has completed: GED  Risk to self with the past 6 months Suicidal Ideation: Yes-Currently Present Has patient been a risk to self within the past 6 months prior to admission? : Yes Suicidal Intent: Yes-Currently Present Has patient had any suicidal intent within the past 6 months prior to admission? : Yes Is patient at risk for suicide?: Yes Suicidal Plan?: Yes-Currently Present Has patient had any suicidal plan within the past 6 months prior to admission? : Yes Specify Current Suicidal Plan: cut wrists with a knife Access to Means: Yes Specify Access to Suicidal Means: access to knives What has been your use of drugs/alcohol within the last 12 months?: chronic alcoholic Previous Attempts/Gestures: Yes How many times?: (multiple) Other Self Harm Risks: yes Triggers for Past Attempts: Unpredictable Intentional Self Injurious Behavior: None Family Suicide History: Unknown Recent stressful life event(s): Loss (Comment), Recent negative physical changes Persecutory voices/beliefs?: No Depression: Yes Depression Symptoms: Despondent, Feeling worthless/self pity, Insomnia, Feeling angry/irritable, Loss of interest in usual pleasures Substance abuse history and/or treatment for substance abuse?: Yes Suicide prevention information given to non-admitted patients: Not applicable  Risk to Others within the past 6 months Homicidal Ideation: No Does patient have any lifetime risk of violence toward others beyond the six months prior to admission? : No Thoughts of Harm to Others: Yes-Currently Present Comment - Thoughts of Harm to Others: states he  would like to "hurt those that irritate him" Current Homicidal Intent: No Current Homicidal Plan: No Access to Homicidal Means: No Identified Victim: none History of harm to others?: No Assessment of Violence: None Noted Violent Behavior Description: none Does patient have access to weapons?: No Criminal Charges Pending?: No Does patient have a court date: No Is patient on probation?: No  Psychosis Hallucinations: None noted Delusions: None noted  Mental Status Report Appearance/Hygiene: Disheveled Eye Contact: Fair Motor Activity: Freedom of movement Speech: Logical/coherent Level of Consciousness: Alert Mood: Depressed Affect: Appropriate to circumstance Anxiety Level: None Thought Processes: Coherent Judgement: Impaired Orientation: Person, Place, Time, Situation Obsessive Compulsive Thoughts/Behaviors: None  Cognitive Functioning Concentration: Normal Memory: Recent Intact, Remote Intact IQ: Average Insight: Poor Impulse Control: Poor Appetite: Poor Weight Loss: 0 Weight Gain: 0 Sleep: Decreased Total Hours of Sleep: 4  ADLScreening [facility] Assessment Services) Patient's cognitive ability adequate to safely complete daily activities?: Yes Patient able to express need for assistance with ADLs?: Yes Independently performs ADLs?: Yes (appropriate for developmental age)  Prior Inpatient Therapy Prior Inpatient Therapy: Yes Prior Therapy Dates: multiple Prior Therapy Facilty/Provider(s): [facility] Reason for Treatment: Alcoholism, depression  Prior Outpatient Therapy Prior Outpatient Therapy: Yes Does patient have an ACCT team?: No Does patient have Intensive In-House Services?  : No Does patient have [facility] services? : No Does patient have P4CC  services?: No  ADL Screening (condition at time of admission) Patient's cognitive ability adequate to safely complete daily activities?: Yes Is the patient deaf or have difficulty hearing?: No Does the patient have  difficulty seeing, even when wearing glasses/contacts?: No Does the patient have difficulty concentrating, remembering, or making decisions?: No Patient able to express need for assistance with ADLs?: Yes Does the patient have difficulty dressing or bathing?: No Independently performs ADLs?: Yes (appropriate for developmental age) Does the patient have difficulty walking or climbing stairs?: No Weakness of Legs: None Weakness of Arms/Hands: None  Home Assistive Devices/Equipment Home Assistive Devices/Equipment: None  Therapy Consults (therapy consults require a physician order) PT Evaluation Needed: No OT Evalulation Needed: No SLP Evaluation Needed: No Abuse/Neglect Assessment (Assessment to be complete while patient is alone) Abuse/Neglect Assessment Can Be Completed: Yes Physical Abuse: Denies Verbal Abuse: Yes, past (Comment) Sexual Abuse: Denies Exploitation of patient/patient's resources: Denies Self-Neglect: Yes, present (Comment) Values / Beliefs Cultural Requests During Hospitalization: None Spiritual Requests During Hospitalization: None Consults Spiritual Care Consult Needed: No Social Work Consult Needed: No Regulatory affairs officer (For Healthcare) Does Patient Have a Medical Advance Directive?: No Would patient like information on creating a medical advance directive?: No - Patient declined    Additional Information 1:1 In Past 12 Months?: No CIRT Risk: No Elopement Risk: No Does patient have medical clearance?: Yes     Disposition:  Disposition Initial Assessment Completed for this Encounter: Yes Disposition of Patient: Other dispositions  On Site Evaluation by:  Mindi Curling, LCAS  Reviewed with Physician:  Dr. Annamary Rummage [facility] 04/27/2017 10:10 AM

## 2017-04-28 ENCOUNTER — Ambulatory Visit (HOSPITAL_COMMUNITY): Payer: Self-pay

## 2017-04-28 DIAGNOSIS — Z811 Family history of alcohol abuse and dependence: Secondary | ICD-10-CM

## 2017-04-28 DIAGNOSIS — F1024 Alcohol dependence with alcohol-induced mood disorder: Secondary | ICD-10-CM

## 2017-04-28 DIAGNOSIS — F17210 Nicotine dependence, cigarettes, uncomplicated: Secondary | ICD-10-CM

## 2017-04-28 DIAGNOSIS — R45851 Suicidal ideations: Secondary | ICD-10-CM

## 2017-04-28 MED ORDER — THIAMINE HCL 100 MG/ML IJ SOLN: 100.0000 mg | Freq: Every day | INTRAMUSCULAR | Status: DC

## 2017-04-28 MED ORDER — VITAMIN B-1 100 MG PO TABS
100.0000 mg | ORAL_TABLET | Freq: Every day | ORAL | Status: DC
  Administered 2017-04-28: 100 mg via ORAL
  Filled 2017-04-28: qty 1

## 2017-04-28 MED ORDER — LORAZEPAM 1 MG PO TABS: 0.0000 mg | ORAL_TABLET | Freq: Two times a day (BID) | ORAL | Status: DC

## 2017-04-28 MED ORDER — LORAZEPAM 2 MG/ML IJ SOLN: 0.0000 mg | Freq: Two times a day (BID) | INTRAMUSCULAR | Status: DC

## 2017-04-28 MED ORDER — LORAZEPAM 2 MG/ML IJ SOLN: 0.0000 mg | Freq: Four times a day (QID) | INTRAMUSCULAR | Status: DC

## 2017-04-28 MED ORDER — LORAZEPAM 1 MG PO TABS
0.0000 mg | ORAL_TABLET | Freq: Four times a day (QID) | ORAL | Status: DC
  Administered 2017-04-28: 1 mg via ORAL
  Filled 2017-04-28: qty 1

## 2017-04-28 NOTE — BHH Suicide Risk Assessment (Signed)
Suicide Risk Assessment  Discharge Assessment   [facility] Discharge Suicide Risk Assessment   Principal Problem: Alcohol dependence with alcohol-induced mood disorder [facility]) Discharge Diagnoses:  Patient Active Problem List   Diagnosis Date Noted  . Cocaine abuse ([place]) [F14.10] 11/18/2016    Priority: High  . Alcohol dependence with alcohol-induced mood disorder ([place]) [F10.24] 02/14/2016    Priority: High  . Anemia [D64.9] 03/18/2017  . Acute blood loss anemia [D62] 02/21/2017  . UGI bleed [K92.2] 02/08/2017  . Alcohol withdrawal ([place]) [F10.239] 02/08/2017  . GI bleed [K92.2] 11/01/2016  . Alcohol abuse [F10.10]   . Major depressive disorder, recurrent severe without psychotic features ([place]) [F33.2] 07/06/2016  . Alcohol-induced mood disorder ([place]) [F10.94] 06/15/2016  . Seizure ([place]) [R56.9] 05/26/2016  . Tobacco abuse [Z72.0] 05/26/2016  . Homeless [Z59.0] 05/26/2016  . Essential hypertension [I10] 05/26/2016  . Severe alcohol withdrawal without perceptual disturbances without complication ([place]) [K99.833] 02/14/2016  . Alcoholic hepatitis without ascites [K70.10] 02/14/2016  . Thrombocytopenia ([place]) [D69.6] 02/14/2016    Total Time spent with patient: 45 minutes   Musculoskeletal: Strength & Muscle Tone: within normal limits Gait & Station: normal Patient leans: N/A  Psychiatric Specialty Exam: Physical Exam  Constitutional: He is oriented to person, place, and time. He appears well-developed and well-nourished.  HENT:  Head: Normocephalic.  Neck: Normal range of motion.  Respiratory: Effort normal.  Musculoskeletal: Normal range of motion.  Neurological: He is alert and oriented to person, place, and time.  Psychiatric: He has a normal mood and affect. His speech is normal and behavior is normal. Judgment and thought content normal. Cognition and memory are normal.    Review of Systems  Psychiatric/Behavioral: Positive for substance abuse.  All other systems reviewed  and are negative.   Blood pressure (!) 154/100, pulse 79, temperature 98.7 F (37.1 C), temperature source Oral, resp. rate 18, SpO2 98 %.There is no height or weight on file to calculate BMI.  General Appearance: Disheveled  Eye Contact:  Good  Speech:  Normal Rate  Volume:  Normal  Mood:  Euthymic  Affect:  Congruent  Thought Process:  Coherent and Descriptions of Associations: Intact  Orientation:  Full (Time, Place, and Person)  Thought Content:  WDL and Logical  Suicidal Thoughts:  No  Homicidal Thoughts:  No  Memory:  Immediate;   Good Recent;   Good Remote;   Good  Judgement:  Fair  Insight:  Fair  Psychomotor Activity:  Normal  Concentration:  Concentration: Good and Attention Span: Good  Recall:  Good  Fund of Knowledge:  Fair  Language:  Good  Akathisia:  No  Handed:  Right  AIMS (if indicated):     Assets:  Leisure Time Physical Health Resilience Social Support  ADL's:  Intact  Cognition:  WNL  Sleep:      Mental Status Per Nursing Assessment::   On Admission:   alcohol abuse with suicidal ideations  Demographic Factors:  Male and Caucasian  Loss Factors: NA  Historical Factors: NA  Risk Reduction Factors:   Sense of responsibility to family  Continued Clinical Symptoms:  None   Cognitive Features That Contribute To Risk:  None    Suicide Risk:  Minimal: No identifiable suicidal ideation.  Patients presenting with no risk factors but with morbid ruminations; may be classified as minimal risk based on the severity of the depressive symptoms    Plan Of Care/Follow-up recommendations:  Activity:  as tolerated Diet:  heart healthy diet  Novia Lansberry,  NP 04/28/2017, 2:37 PM

## 2017-04-28 NOTE — ED Notes (Signed)
Pt d/c home per MD order. Discharge summary reviewed with pt. Pt verbalizes understanding. Pt denies SI/HI/AVH. Pt signed e-signature. Bus pass provided per pt request. Pt ambulatory off unit with [facility].

## 2017-04-28 NOTE — Consult Note (Signed)
[facility] Face-to-Face Psychiatry Consult   Reason for Consult:  Alcohol abuse with suicidal ideations Referring Physician:  EDP Patient Identification: Barry Moore MRN:  [id number] Principal Diagnosis: Alcohol dependence with alcohol-induced mood disorder [facility]) Diagnosis:   Patient Active Problem List   Diagnosis Date Noted  . Cocaine abuse ([place]) [F14.10] 11/18/2016    Priority: High  . Alcohol dependence with alcohol-induced mood disorder ([place]) [F10.24] 02/14/2016    Priority: High  . Anemia [D64.9] 03/18/2017  . Acute blood loss anemia [D62] 02/21/2017  . UGI bleed [K92.2] 02/08/2017  . Alcohol withdrawal ([place]) [F10.239] 02/08/2017  . GI bleed [K92.2] 11/01/2016  . Alcohol abuse [F10.10]   . Major depressive disorder, recurrent severe without psychotic features ([place]) [F33.2] 07/06/2016  . Alcohol-induced mood disorder ([place]) [F10.94] 06/15/2016  . Seizure ([place]) [R56.9] 05/26/2016  . Tobacco abuse [Z72.0] 05/26/2016  . Homeless [Z59.0] 05/26/2016  . Essential hypertension [I10] 05/26/2016  . Severe alcohol withdrawal without perceptual disturbances without complication ([place]) [D32.202] 02/14/2016  . Alcoholic hepatitis without ascites [K70.10] 02/14/2016  . Thrombocytopenia ([place]) [D69.6] 02/14/2016    Total Time spent with patient: 45 minutes  Subjective:   Barry Moore is a 52 y.o. male patient does not warrant admission.  HPI:  52 yo male who presented to the ED with alcohol intoxication with suicidal ideations.  Today on assessment, he denies suicidal/homicidal ideations, hallucinations, and withdrawal symptoms.  Peer support met with him but he had no interest in rehabs or recovery facilities for his alcohol dependence.  Requested to leave, stable for discharge.  Past Psychiatric History: alcohol dependence  Risk to Self: None Risk to Others: None Prior Inpatient Therapy: Prior Inpatient Therapy: Yes Prior Therapy Dates: multiple Prior Therapy Facilty/Provider(s):  [facility] Reason for Treatment: Alcoholism, depression Prior Outpatient Therapy: Prior Outpatient Therapy: Yes Does patient have an ACCT team?: No Does patient have Intensive In-House Services?  : No Does patient have [facility] services? : No Does patient have P4CC services?: No  Past Medical History:  Past Medical History:  Diagnosis Date  . Alcoholic hepatitis   . Depression   . ETOH abuse   . Hypertension   . Pancreatitis   . Seizures ([place])     Past Surgical History:  Procedure Laterality Date  . ESOPHAGOGASTRODUODENOSCOPY (EGD) WITH PROPOFOL N/A 11/02/2016   Procedure: ESOPHAGOGASTRODUODENOSCOPY (EGD) WITH PROPOFOL;  Surgeon: Carol Ada, MD;  Location: [facility] END[facility]COPY;  Service: Endoscopy;  Laterality: N/A;  . HERNIA REPAIR    . LEG SURGERY     Family History:  Family History  Problem Relation Age of Onset  . Diabetes Mother   . Alcoholism Mother   . Emphysema Father   . Lung cancer Father   . Alcoholism Father    Family Psychiatric  History: none Social History:  Social History   Substance and Sexual Activity  Alcohol Use Yes   Comment: 4-5 40oz beers daily     Social History   Substance and Sexual Activity  Drug Use Yes  . Frequency: 3.0 times per week  . Types: Cocaine   Comment: last use today 2 weeks ago (04-09-17)     Social History   Socioeconomic History  . Marital status: Divorced    Spouse name: Not on file  . Number of children: Not on file  . Years of education: Not on file  . Highest education level: Not on file  Social Needs  . Financial resource strain: Not on file  . Food insecurity - worry: Not on file  .  Food insecurity - inability: Not on file  . Transportation needs - medical: Not on file  . Transportation needs - non-medical: Not on file  Occupational History  . Not on file  Tobacco Use  . Smoking status: Current Every Day Smoker    Packs/day: 1.00    Types: Cigarettes  . Smokeless tobacco: Never Used  Substance and Sexual Activity   . Alcohol use: Yes    Comment: 4-5 40oz beers daily  . Drug use: Yes    Frequency: 3.0 times per week    Types: Cocaine    Comment: last use today 2 weeks ago (04-09-17)   . Sexual activity: Not Currently  Other Topics Concern  . Not on file  Social History Narrative  . Not on file   Additional Social History: N/A    Allergies:   Allergies  Allergen Reactions  . Tomato Shortness Of Breath and Nausea And Vomiting  . Aspirin Nausea And Vomiting    Labs:  Results for orders placed or performed during the hospital encounter of 04/26/17 (from the past 48 hour(s))  Comprehensive metabolic panel     Status: Abnormal   Collection Time: 04/26/17  9:58 PM  Result Value Ref Range   Sodium 141 135 - 145 mmol/L   Potassium 3.8 3.5 - 5.1 mmol/L   Chloride 106 101 - 111 mmol/L   CO2 27 22 - 32 mmol/L   Glucose, Bld 97 65 - 99 mg/dL   BUN 7 6 - 20 mg/dL   Creatinine, Ser 0.82 0.61 - 1.24 mg/dL   Calcium 8.9 8.9 - 10.3 mg/dL   Total Protein 8.1 6.5 - 8.1 g/dL   Albumin 4.0 3.5 - 5.0 g/dL   AST 162 (H) 15 - 41 U/L   ALT 80 (H) 17 - 63 U/L   Alkaline Phosphatase 129 (H) 38 - 126 U/L   Total Bilirubin 0.2 (L) 0.3 - 1.2 mg/dL   GFR calc non Af Amer >60 >60 mL/min   GFR calc Af Amer >60 >60 mL/min    Comment: (NOTE) The eGFR has been calculated using the CKD EPI equation. This calculation has not been validated in all clinical situations. eGFR's persistently <60 mL/min signify possible Chronic Kidney Disease.    Anion gap 8 5 - 15    Comment: Performed at [facility], [place] [address], [place], [place] [address]  Ethanol     Status: Abnormal   Collection Time: 04/26/17  9:58 PM  Result Value Ref Range   Alcohol, Ethyl (B) 365 (HH) <10 mg/dL    Comment:        LOWEST DETECTABLE LIMIT FOR SERUM ALCOHOL IS 10 mg/dL FOR MEDICAL PURPOSES ONLY CRITICAL RESULT CALLED TO, READ BACK BY AND VERIFIED WITH: RAND,K RN 2.5.19 _0  ZANDO,C Performed at [facility], [place] [address], [place], [place] [address]   Salicylate level     Status: None   Collection Time: 04/26/17  9:58 PM  Result Value Ref Range   Salicylate Lvl <8.6 2.8 - 30.0 mg/dL    Comment: Performed at [facility], [place] [address], [place], [place] [address]  Acetaminophen level     Status: Abnormal   Collection Time: 04/26/17  9:58 PM  Result Value Ref Range   Acetaminophen (Tylenol), Serum <10 (L) 10 - 30 ug/mL    Comment:        THERAPEUTIC CONCENTRATIONS VARY SIGNIFICANTLY. A RANGE OF 10-30 ug/mL MAY BE AN EFFECTIVE CONCENTRATION  FOR MANY PATIENTS. HOWEVER, SOME ARE BEST TREATED AT CONCENTRATIONS OUTSIDE THIS RANGE. ACETAMINOPHEN CONCENTRATIONS >150 ug/mL AT 4 HOURS AFTER INGESTION AND >50 ug/mL AT 12 HOURS AFTER INGESTION ARE OFTEN ASSOCIATED WITH TOXIC REACTIONS. Performed at [facility] [place] [address] [place], [place] [address]   cbc     Status: Abnormal   Collection Time: 04/26/17  9:58 PM  Result Value Ref Range   WBC 5.4 4.0 - 10.5 K/uL   RBC 4.10 (L) 4.22 - 5.81 MIL/uL   Hemoglobin 9.1 (L) 13.0 - 17.0 g/dL   HCT 29.3 (L) 39.0 - 52.0 %   MCV 71.5 (L) 78.0 - 100.0 fL   MCH 22.2 (L) 26.0 - 34.0 pg   MCHC 31.1 30.0 - 36.0 g/dL   RDW 19.4 (H) 11.5 - 15.5 %   Platelets 184 150 - 400 K/uL    Comment: Performed at [facility], [place] [address], [place], [place] [address]  Rapid urine drug screen (hospital performed)     Status: Abnormal   Collection Time: 04/27/17  3:15 AM  Result Value Ref Range   Opiates NONE DETECTED NONE DETECTED   Cocaine POSITIVE (A) NONE DETECTED   Benzodiazepines POSITIVE (A) NONE DETECTED   Amphetamines NONE DETECTED NONE DETECTED   Tetrahydrocannabinol NONE DETECTED NONE DETECTED   Barbiturates NONE DETECTED NONE DETECTED    Comment: (NOTE) DRUG SCREEN FOR MEDICAL PURPOSES ONLY.  IF CONFIRMATION IS NEEDED FOR ANY PURPOSE, NOTIFY LAB WITHIN 5  DAYS. LOWEST DETECTABLE LIMITS FOR URINE DRUG SCREEN Drug Class                     Cutoff (ng/mL) Amphetamine and metabolites    1000 Barbiturate and metabolites    200 Benzodiazepine                 098 Tricyclics and metabolites     300 Opiates and metabolites        300 Cocaine and metabolites        300 THC                            50 Performed at [facility], [place] [address] [place],  [address]     Current Facility-Administered Medications  Medication Dose Route Frequency Provider Last Rate Last Dose  . alum & mag hydroxide-simeth (MAALOX/MYLANTA) 200-200-20 MG/5ML suspension 30 mL  30 mL Oral Q6H PRN Nanavati, Ankit, MD      . haloperidol (HALDOL) tablet 5 mg  5 mg Oral Q6H PRN Nanavati, Ankit, MD       And  . benztropine (COGENTIN) tablet 1 mg  1 mg Oral Q6H PRN Nanavati, Ankit, MD      . LORazepam (ATIVAN) injection 0-4 mg  0-4 mg Intravenous Q6H Lord, Asa Saunas, NP       Or  . LORazepam (ATIVAN) tablet 0-4 mg  0-4 mg Oral Q6H Patrecia Pour, NP   1 mg at 04/28/17 7829  . [START ON 04/30/2017] LORazepam (ATIVAN) injection 0-4 mg  0-4 mg Intravenous Q12H Patrecia Pour, NP       Or  . Derrill Memo ON 04/30/2017] LORazepam (ATIVAN) tablet 0-4 mg  0-4 mg Oral Q12H Lord, Asa Saunas, NP      . ondansetron [facility]) tablet 4 mg  4 mg Oral Q8H PRN Varney Biles, MD      . thiamine (VITAMIN B-1) tablet  100 mg  100 mg Oral Daily Patrecia Pour, NP   100 mg at 04/28/17 7829   Or  . thiamine (B-1) injection 100 mg  100 mg Intravenous Daily Patrecia Pour, NP       Current Outpatient Medications  Medication Sig Dispense Refill  . docusate sodium (COLACE) 100 MG capsule Take 1 capsule (100 mg total) by mouth daily. (Patient not taking: Reported on 04/26/2017) 30 capsule 0  . ferrous sulfate 325 (65 FE) MG tablet Take 1 tablet (325 mg total) by mouth daily. (Patient not taking: Reported on 04/26/2017) 30 tablet 0  . folic acid (FOLVITE) 1 MG tablet Take 1 tablet (1  mg total) by mouth daily. (Patient not taking: Reported on 04/26/2017) 30 tablet 0  . gabapentin (NEURONTIN) 300 MG capsule Take 1 capsule (300 mg total) by mouth 3 (three) times daily. (Patient not taking: Reported on 04/26/2017) 90 capsule 0  . hydrOXYzine (ATARAX/VISTARIL) 50 MG tablet Take 1 tablet (50 mg total) by mouth 3 (three) times daily. (Patient not taking: Reported on 04/26/2017) 30 tablet 0  . omeprazole (PRILOSEC) 20 MG capsule Take 1 capsule (20 mg total) by mouth daily. (Patient not taking: Reported on 04/26/2017) 30 capsule 0  . thiamine 100 MG tablet Take 1 tablet (100 mg total) by mouth daily. (Patient not taking: Reported on 04/26/2017) 30 tablet 0  . traZODone (DESYREL) 50 MG tablet Take 1 tablet (50 mg total) by mouth at bedtime. (Patient not taking: Reported on 04/26/2017) 30 tablet 0    Musculoskeletal: Strength & Muscle Tone: within normal limits Gait & Station: normal Patient leans: N/A  Psychiatric Specialty Exam: Physical Exam  Nursing note and vitals reviewed. Constitutional: He is oriented to person, place, and time. He appears well-developed and well-nourished.  HENT:  Head: Normocephalic.  Neck: Normal range of motion.  Respiratory: Effort normal.  Musculoskeletal: Normal range of motion.  Neurological: He is alert and oriented to person, place, and time.  Psychiatric: He has a normal mood and affect. His speech is normal and behavior is normal. Judgment and thought content normal. Cognition and memory are normal.    Review of Systems  Psychiatric/Behavioral: Positive for substance abuse.  All other systems reviewed and are negative.   Blood pressure (!) 154/100, pulse 79, temperature 98.7 F (37.1 C), temperature source Oral, resp. rate 18, SpO2 98 %.There is no height or weight on file to calculate BMI.  General Appearance: Disheveled  Eye Contact:  Good  Speech:  Normal Rate  Volume:  Normal  Mood:  Euthymic  Affect:  Congruent  Thought Process:  Coherent  and Descriptions of Associations: Intact  Orientation:  Full (Time, Place, and Person)  Thought Content:  WDL and Logical  Suicidal Thoughts:  No  Homicidal Thoughts:  No  Memory:  Immediate;   Good Recent;   Good Remote;   Good  Judgement:  Fair  Insight:  Fair  Psychomotor Activity:  Normal  Concentration:  Concentration: Good and Attention Span: Good  Recall:  Good  Fund of Knowledge:  Fair  Language:  Good  Akathisia:  No  Handed:  Right  AIMS (if indicated):   N/A  Assets:  Leisure Time Physical Health Resilience Social Support  ADL's:  Intact  Cognition:  WNL  Sleep:   N/A     Treatment Plan Summary: Daily contact with patient to assess and evaluate symptoms and progress in treatment, Medication management and Plan alcohol abuse with alcohol induced mood disorder:  -  Crisis stabilization -Medication management:  Ativan alcohol detox protocol started -Individual and substance abuse counseling -Peer support counseling Disposition: No evidence of imminent risk to self or others at present.   Patient does not meet criteria for psychiatric inpatient admission.  Waylan Boga, NP 04/28/2017 2:30 PM   Patient seen face-to-face for psychiatric evaluation, chart reviewed and case discussed with the physician extender and developed treatment plan. Reviewed the information documented and agree with the treatment plan.  Buford Dresser, DO 04/28/17 4:40 PM

## 2017-04-28 NOTE — Discharge Instructions (Signed)
For your mental health needs, you are advised to follow up with [facility].  New and returning patients are seen at their walk-in clinic.  Walk-in hours are Monday - Friday from 8:00 am - 3:00 pm.  Walk-in patients are seen on a first come, first served basis.  Try to arrive as early as possible for he best chance of being seen the same day: ° °     [facility] °     [address] °     [place], [place] [address] °     [phone] °

## 2017-04-28 NOTE — Patient Outreach (Signed)
[facility]PSS met with the patient and provided substance use recovery support. Patient stated that he is not interested in substance use treatment at this time. CPSS still provided support for the patient and talked to the patient about AA meetings. Patient is currently homeless. CPSS has met with the patient multiple times in the past year and provided support. The patient was working with a peer support specialist at the [facility] as well. CPSS also provided CPSS contact information. CPSS encouraged the patient to contact CPSS at anytime for substance use recovery support.

## 2017-04-28 NOTE — BH Assessment (Signed)
[facility] Assessment Progress Note  Per Buford Dresser, DO, this pt does not require psychiatric hospitalization at this time.  Pt is to be discharged from [facility] with recommendation to follow up with [facility].  This has been included in pt's discharge instructions.  Pt would also benefit from seeing Peer Support Specialists; they will be asked to speak to pt.  Pt's nurse, Caryl Pina, has been notified.  Jalene Mullet, [place] Triage Specialist [phone]

## 2017-05-05 ENCOUNTER — Emergency Department (HOSPITAL_COMMUNITY): Payer: Self-pay

## 2017-05-05 ENCOUNTER — Encounter (HOSPITAL_COMMUNITY): Payer: Self-pay | Admitting: Emergency Medicine

## 2017-05-05 DIAGNOSIS — I10 Essential (primary) hypertension: Secondary | ICD-10-CM | POA: Insufficient documentation

## 2017-05-05 DIAGNOSIS — F17210 Nicotine dependence, cigarettes, uncomplicated: Secondary | ICD-10-CM | POA: Insufficient documentation

## 2017-05-05 DIAGNOSIS — F332 Major depressive disorder, recurrent severe without psychotic features: Secondary | ICD-10-CM | POA: Insufficient documentation

## 2017-05-05 DIAGNOSIS — R45850 Homicidal ideations: Secondary | ICD-10-CM | POA: Insufficient documentation

## 2017-05-05 DIAGNOSIS — F1020 Alcohol dependence, uncomplicated: Secondary | ICD-10-CM | POA: Insufficient documentation

## 2017-05-05 DIAGNOSIS — R45851 Suicidal ideations: Secondary | ICD-10-CM | POA: Insufficient documentation

## 2017-05-05 DIAGNOSIS — Z79899 Other long term (current) drug therapy: Secondary | ICD-10-CM | POA: Insufficient documentation

## 2017-05-05 LAB — CBC
HEMATOCRIT: 30.9 % — AB (ref 39.0–52.0)
HEMOGLOBIN: 9.3 g/dL — AB (ref 13.0–17.0)
MCH: 21.8 pg — AB (ref 26.0–34.0)
MCHC: 30.1 g/dL (ref 30.0–36.0)
MCV: 72.5 fL — ABNORMAL LOW (ref 78.0–100.0)
Platelets: 254 10*3/uL (ref 150–400)
RBC: 4.26 MIL/uL (ref 4.22–5.81)
RDW: 19.3 % — ABNORMAL HIGH (ref 11.5–15.5)
WBC: 4.7 10*3/uL (ref 4.0–10.5)

## 2017-05-05 LAB — BASIC METABOLIC PANEL
ANION GAP: 12 (ref 5–15)
BUN: <5 mg/dL — ABNORMAL LOW (ref 6–20)
CO2: 22 mmol/L (ref 22–32)
Calcium: 8.8 mg/dL — ABNORMAL LOW (ref 8.9–10.3)
Chloride: 104 mmol/L (ref 101–111)
Creatinine, Ser: 0.89 mg/dL (ref 0.61–1.24)
GFR calc non Af Amer: >60 mL/min (ref >60)
GLUCOSE: 119 mg/dL — AB (ref 65–99)
POTASSIUM: 3.5 mmol/L (ref 3.5–5.1)
Sodium: 138 mmol/L (ref 135–145)

## 2017-05-05 LAB — RAPID URINE DRUG SCREEN, HOSP PERFORMED
AMPHETAMINES: NOT DETECTED
Barbiturates: NOT DETECTED
Benzodiazepines: POSITIVE — AB
COCAINE: NOT DETECTED
OPIATES: NOT DETECTED
TETRAHYDROCANNABINOL: NOT DETECTED

## 2017-05-05 LAB — ETHANOL: Alcohol, Ethyl (B): 363 mg/dL (ref <10)

## 2017-05-05 LAB — LIPASE, BLOOD: Lipase: 60 U/L — ABNORMAL HIGH (ref 11–51)

## 2017-05-05 LAB — I-STAT TROPONIN, ED: TROPONIN I, POC: 0 ng/mL (ref 0.00–0.08)

## 2017-05-05 MED ORDER — ACETAMINOPHEN 325 MG PO TABS: 325.0000 mg | ORAL_TABLET | Freq: Once | ORAL | Status: DC

## 2017-05-05 NOTE — ED Notes (Signed)
Called to make them aware of need for sitter

## 2017-05-05 NOTE — ED Notes (Signed)
Patient reports to PA thoughts of SI.

## 2017-05-05 NOTE — ED Triage Notes (Signed)
Per EMS:  Patient picked up behind a local store with c/o SOB, generalized weakness, diarrhea (x 3 days), nausea and emesis chest pain and ETOH abuse.

## 2017-05-05 NOTE — ED Provider Notes (Signed)
Patient placed in Quick Look pathway, seen and evaluated   Chief Complaint: Chest Pain and Suicidal Ideation  HPI:  Presents with thoughts of killing himself with a knife for the past two days. States that he has tried overdosing with "pills" in the past. Has drank alcohol today, denies recreational drug use today. Also reports bilateral anterior chest pain which is sharp and stabbing with associated shortness of breath. Also endorses diaphoresis. Denies history of MI.   ROS: + SOB and SI  Physical Exam:   Gen: No distress  Neuro: Awake and Alert  Skin: Warm    Focused Exam: Lungs CTA. Heart regular rate and rhythm.    Initiation of care has begun. The patient has been counseled on the process, plan, and necessity for staying for the completion/evaluation, and the remainder of the medical screening examination    Barry Moore 05/05/17 1904    Daleen Bo, MD 05/06/17 1116

## 2017-05-06 ENCOUNTER — Emergency Department (HOSPITAL_COMMUNITY)
Admission: EM | Admit: 2017-05-06 | Discharge: 2017-05-06 | Disposition: A | Discharge status: Home / Self Care | Payer: Self-pay | Attending: Emergency Medicine | Admitting: Emergency Medicine

## 2017-05-06 ENCOUNTER — Emergency Department (HOSPITAL_COMMUNITY): Payer: Self-pay

## 2017-05-06 DIAGNOSIS — R0789 Other chest pain: Secondary | ICD-10-CM

## 2017-05-06 DIAGNOSIS — G8929 Other chronic pain: Secondary | ICD-10-CM

## 2017-05-06 DIAGNOSIS — R109 Unspecified abdominal pain: Secondary | ICD-10-CM

## 2017-05-06 DIAGNOSIS — R45851 Suicidal ideations: Secondary | ICD-10-CM

## 2017-05-06 DIAGNOSIS — R45850 Homicidal ideations: Secondary | ICD-10-CM

## 2017-05-06 DIAGNOSIS — R509 Fever, unspecified: Secondary | ICD-10-CM

## 2017-05-06 LAB — INFLUENZA PANEL BY PCR (TYPE A & B)
Influenza A By PCR: NEGATIVE
Influenza B By PCR: NEGATIVE

## 2017-05-06 LAB — URINALYSIS, ROUTINE W REFLEX MICROSCOPIC
Bilirubin Urine: NEGATIVE
GLUCOSE, UA: NEGATIVE mg/dL
Hgb urine dipstick: NEGATIVE
KETONES UR: NEGATIVE mg/dL
LEUKOCYTES UA: NEGATIVE
NITRITE: NEGATIVE
Protein, ur: NEGATIVE mg/dL
SPECIFIC GRAVITY, URINE: 1.011 (ref 1.005–1.030)
pH: 5 (ref 5.0–8.0)

## 2017-05-06 LAB — I-STAT TROPONIN, ED: Troponin i, poc: 0 ng/mL (ref 0.00–0.08)

## 2017-05-06 MED ORDER — LORAZEPAM 2 MG/ML IJ SOLN: 0.0000 mg | Freq: Two times a day (BID) | INTRAMUSCULAR | Status: DC

## 2017-05-06 MED ORDER — LORAZEPAM 2 MG/ML IJ SOLN: 0.0000 mg | Freq: Four times a day (QID) | INTRAMUSCULAR | Status: DC

## 2017-05-06 MED ORDER — LORAZEPAM 1 MG PO TABS
0.0000 mg | ORAL_TABLET | Freq: Four times a day (QID) | ORAL | Status: DC
  Administered 2017-05-06: 1 mg via ORAL
  Filled 2017-05-06: qty 1

## 2017-05-06 MED ORDER — IOPAMIDOL (ISOVUE-300) INJECTION 61%
INTRAVENOUS | Status: AC
  Administered 2017-05-06: 100 mL
  Filled 2017-05-06: qty 100

## 2017-05-06 MED ORDER — LORAZEPAM 1 MG PO TABS: 0.0000 mg | ORAL_TABLET | Freq: Two times a day (BID) | ORAL | Status: DC

## 2017-05-06 MED ORDER — ACETAMINOPHEN 500 MG PO TABS: 500.0000 mg | ORAL_TABLET | Freq: Four times a day (QID) | ORAL | Status: DC | PRN

## 2017-05-06 MED ORDER — DICYCLOMINE HCL 10 MG/ML IM SOLN
20.0000 mg | Freq: Once | INTRAMUSCULAR | Status: AC
  Administered 2017-05-06: 20 mg via INTRAMUSCULAR
  Filled 2017-05-06: qty 2

## 2017-05-06 MED ORDER — VITAMIN B-1 100 MG PO TABS: 100.0000 mg | ORAL_TABLET | Freq: Every day | ORAL | Status: DC

## 2017-05-06 MED ORDER — SODIUM CHLORIDE 0.9 % IV BOLUS (SEPSIS)
1000.0000 mL | Freq: Once | INTRAVENOUS | Status: AC
  Administered 2017-05-06: 1000 mL via INTRAVENOUS

## 2017-05-06 MED ORDER — FOLIC ACID 1 MG PO TABS: 1.0000 mg | ORAL_TABLET | Freq: Every day | ORAL | Status: DC

## 2017-05-06 MED ORDER — THIAMINE HCL 100 MG/ML IJ SOLN: 100.0000 mg | Freq: Every day | INTRAMUSCULAR | Status: DC

## 2017-05-06 MED ORDER — ONDANSETRON HCL 4 MG/2ML IJ SOLN
4.0000 mg | Freq: Once | INTRAMUSCULAR | Status: AC
  Administered 2017-05-06: 4 mg via INTRAVENOUS
  Filled 2017-05-06: qty 2

## 2017-05-06 NOTE — ED Provider Notes (Signed)
TIME SEEN: 1:27 AM  CHIEF COMPLAINT: Multiple complaints  HPI: Patient is a 52 year old male with history of alcohol abuse, hypertension, pancreatitis who presents to the emergency department with multiple complaints.  Patient reports that he has had sharp lower abdominal pain for the past 2 days.  No aggravating or relieving has also had diarrhea without bloody stool or melena.  Has had nausea and dry heaves but no vomiting.  No history of previous abdominal surgery.  Found to have a temperature of 101 with EMS.  Also complaining of middle sharp chest pain that is "constant" for the past day.  No shortness of breath.  No aggravating factors.  States it feels better when he is staying still.  Has also had cough that is nonproductive.  Has felt weak all over.  Did not have a flu shot this year.  Also endorses suicidal thoughts and homicidal thoughts.  No hallucinations.  ROS: See HPI Constitutional:  fever  Eyes: no drainage  ENT: no runny nose   Cardiovascular:   chest pain  Resp: no SOB  GI: no vomiting, + diarrhea GU: no dysuria Integumentary: no rash  Allergy: no hives  Musculoskeletal: no leg swelling  Neurological: no slurred speech ROS otherwise negative  PAST MEDICAL HISTORY/PAST SURGICAL HISTORY:  Past Medical History:  Diagnosis Date  . Alcoholic hepatitis   . Depression   . ETOH abuse   . Hypertension   . Pancreatitis   . Seizures ([place])     MEDICATIONS:  Prior to Admission medications   Medication Sig Start Date End Date Taking? Authorizing Provider  docusate sodium (COLACE) 100 MG capsule Take 1 capsule (100 mg total) by mouth daily. Patient not taking: Reported on 04/26/2017 02/28/17   Little, Wenda Overland, MD  ferrous sulfate 325 (65 FE) MG tablet Take 1 tablet (325 mg total) by mouth daily. Patient not taking: Reported on 04/26/2017 02/28/17   Little, Wenda Overland, MD  folic acid (FOLVITE) 1 MG tablet Take 1 tablet (1 mg total) by mouth daily. Patient not taking:  Reported on 04/26/2017 03/20/17   Robbie Lis, MD  gabapentin (NEURONTIN) 300 MG capsule Take 1 capsule (300 mg total) by mouth 3 (three) times daily. Patient not taking: Reported on 04/26/2017 12/30/16   Ethelene Hal, NP  hydrOXYzine (ATARAX/VISTARIL) 50 MG tablet Take 1 tablet (50 mg total) by mouth 3 (three) times daily. Patient not taking: Reported on 04/26/2017 12/30/16   Ethelene Hal, NP  omeprazole (PRILOSEC) 20 MG capsule Take 1 capsule (20 mg total) by mouth daily. Patient not taking: Reported on 04/26/2017 04/05/17   Horton, Barbette Hair, MD  thiamine 100 MG tablet Take 1 tablet (100 mg total) by mouth daily. Patient not taking: Reported on 04/26/2017 03/20/17   Robbie Lis, MD  traZODone (DESYREL) 50 MG tablet Take 1 tablet (50 mg total) by mouth at bedtime. Patient not taking: Reported on 04/26/2017 12/30/16   Ethelene Hal, NP    ALLERGIES:  Allergies  Allergen Reactions  . Tomato Shortness Of Breath and Nausea And Vomiting  . Aspirin Nausea And Vomiting    SOCIAL HISTORY:  Social History   Tobacco Use  . Smoking status: Current Every Day Smoker    Packs/day: 1.00    Types: Cigarettes  . Smokeless tobacco: Never Used  Substance Use Topics  . Alcohol use: Yes    Comment: 4-5 40oz beers daily    FAMILY HISTORY: Family History  Problem Relation Age of Onset  .  Diabetes Mother   . Alcoholism Mother   . Emphysema Father   . Lung cancer Father   . Alcoholism Father     EXAM: BP (!) 146/77 (BP Location: Right Arm)   Pulse 94   Temp 98.6 F (37 C) (Oral)   Resp 18   SpO2 97%  CONSTITUTIONAL: Alert and oriented and responds appropriately to questions.  Afebrile here, chronically ill-appearing, does not appear significantly intoxicated at this time but does smell of alcohol HEAD: Normocephalic EYES: Conjunctivae clear, pupils appear equal, EOMI ENT: normal nose; moist mucous membranes; No pharyngeal erythema or petechiae, no tonsillar  hypertrophy or exudate, no uvular deviation, no unilateral swelling, no trismus or drooling, no muffled voice, normal phonation, no stridor, no dental caries present, no drainable dental abscess noted, no Ludwig's angina, tongue sits flat in the bottom of the mouth, no angioedema, no facial erythema or warmth, no facial swelling; no pain with movement of the neck. NECK: Supple, no meningismus, no nuchal rigidity, no LAD  CARD: RRR; S1 and S2 appreciated; no murmurs, no clicks, no rubs, no gallops RESP: Normal chest excursion without splinting or tachypnea; breath sounds clear and equal bilaterally; no wheezes, no rhonchi, no rales, no hypoxia or respiratory distress, speaking full sentences ABD/GI: Normal bowel sounds; non-distended; soft, diffusely tender throughout the abdomen, no rebound, no guarding, no peritoneal signs, no hepatosplenomegaly BACK:  The back appears normal and is non-tender to palpation, there is no CVA tenderness EXT: Normal ROM in all joints; non-tender to palpation; no edema; normal capillary refill; no cyanosis, no calf tenderness or swelling    SKIN: Normal color for age and race; warm; no rash NEURO: Moves all extremities equally PSYCH: Patient endorses SI and HI.  Unable to contract for safety.  No hallucinations.  MEDICAL DECISION MAKING: Patient here with multiple complaints.  Found to be febrile with EMS.  Otherwise hematin appearing.  Reports cough, nausea, diarrhea.  No headache or neck pain.  Lungs are clear.  Chest x-ray shows no sign of pneumonia.  Diffusely tender throughout the abdomen but frequently presents for the same but given now with new presentation of fever will obtain CT of his abdomen pelvis for further evaluation.  Differential includes appendicitis, colitis, diverticulitis, bowel obstruction, pancreatitis.  Also endorsing SI and HI and unable to contract for safety.  Labs are unremarkable.  No leukocytosis.  LFTs and lipase at baseline.  Urine shows no  sign of infection.  Drug screen positive for benzodiazepines.  He has had 2 troponins that are negative.  EKG shows no ischemic abnormality.  Plan is to obtain a CT of abdomen and pelvis.  Will treat symptoms with IV fluids, Zofran, Bentyl.  If no acute abnormality seen on CT scan, will consult TTS for evaluation.  ED PROGRESS: CT of the abdomen pelvis shows no acute abnormality.  Question chronic cystitis but urine shows no sign of infection.  Patient is medically cleared.  Will consult TTS.  5:30 AM  D/w Marijean Bravo with [facility].  Patient does not meet inpatient criteria for psychiatric admission.  Will discharge patient with outpatient resources.  Flu swab is pending but patient states he could not afford Tamiflu at this time.  Recommended supportive care instructions.  At this time, I do not feel there is any life-threatening condition present. I have reviewed and discussed all results (EKG, imaging, lab, urine as appropriate) and exam findings with patient/family. I have reviewed nursing notes and appropriate previous records.  I feel the patient  is safe to be discharged home without further emergent workup and can continue workup as an outpatient as needed. Discussed usual and customary return precautions. Patient/family verbalize understanding and are comfortable with this plan.  Outpatient follow-up has been provided if needed. All questions have been answered.    EKG Interpretation  Date/Time:  Thursday May 05 2017 18:51:58 EST Ventricular Rate:  104 PR Interval:  140 QRS Duration: 86 QT Interval:  326 QTC Calculation: 428 R Axis:   87 Text Interpretation:  Sinus tachycardia Otherwise normal ECG No significant change since last tracing Confirmed by Josecarlos Harriott, Cyril Mourning [phone]) on 05/06/2017 1:28:04 AM         Carylon Tamburro, Delice Bison, DO 05/06/17 0532

## 2017-05-06 NOTE — ED Notes (Signed)
TTS at bedside. 

## 2017-05-06 NOTE — ED Notes (Addendum)
Regular breakfast tray ordered, advised of tomato allergy. Pt signed Medical Clearance pt policy, copy placed at bedside.
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
## 2017-05-06 NOTE — ED Notes (Signed)
This RN returned valuables and belongings to pt. Pt signed and given copies of inventory sheet and valuables sheet. Copies placed in medical records. Original sheets placed in discharge binder.

## 2017-05-06 NOTE — Discharge Instructions (Signed)
Your labs, urine, CT scan, chest x-ray, EKG were normal today.  You have been seen by the psychiatry team do not meet criteria for inpatient psychiatric treatment.  You did have a fever with EMS.  Your flu swab is pending.  This is treated just like any other viral illness with rest, increase fluids, alternating Tylenol and Motrin for fever and pain.  You may use over-the-counter Imodium for diarrhea.

## 2017-05-06 NOTE — ED Notes (Signed)
Patient wearing maroon  paper scrubs , sitter at bedside , patient's personal belongings inventoried and stored at locker#[id number] , valuables ( wrist watch , wallet and money) at security office safe , wanded by security at triage , sleeping with no distress/respirations unlabored.

## 2017-05-14 ENCOUNTER — Emergency Department (HOSPITAL_COMMUNITY)
Admission: EM | Admit: 2017-05-14 | Discharge: 2017-05-15 | Disposition: A | Discharge status: Other Acute Inpatient Hospital | Payer: Self-pay | Attending: Emergency Medicine | Admitting: Emergency Medicine

## 2017-05-14 ENCOUNTER — Encounter (HOSPITAL_COMMUNITY): Payer: Self-pay | Admitting: Emergency Medicine

## 2017-05-14 DIAGNOSIS — R45851 Suicidal ideations: Secondary | ICD-10-CM | POA: Insufficient documentation

## 2017-05-14 DIAGNOSIS — F332 Major depressive disorder, recurrent severe without psychotic features: Secondary | ICD-10-CM | POA: Insufficient documentation

## 2017-05-14 DIAGNOSIS — F10129 Alcohol abuse with intoxication, unspecified: Secondary | ICD-10-CM | POA: Insufficient documentation

## 2017-05-14 DIAGNOSIS — Y908 Blood alcohol level of 240 mg/100 ml or more: Secondary | ICD-10-CM | POA: Insufficient documentation

## 2017-05-14 DIAGNOSIS — X58XXXA Exposure to other specified factors, initial encounter: Secondary | ICD-10-CM | POA: Insufficient documentation

## 2017-05-14 DIAGNOSIS — I10 Essential (primary) hypertension: Secondary | ICD-10-CM | POA: Insufficient documentation

## 2017-05-14 DIAGNOSIS — Y929 Unspecified place or not applicable: Secondary | ICD-10-CM | POA: Insufficient documentation

## 2017-05-14 DIAGNOSIS — Y939 Activity, unspecified: Secondary | ICD-10-CM | POA: Insufficient documentation

## 2017-05-14 DIAGNOSIS — T1491XA Suicide attempt, initial encounter: Secondary | ICD-10-CM | POA: Insufficient documentation

## 2017-05-14 DIAGNOSIS — F17210 Nicotine dependence, cigarettes, uncomplicated: Secondary | ICD-10-CM | POA: Insufficient documentation

## 2017-05-14 DIAGNOSIS — Y999 Unspecified external cause status: Secondary | ICD-10-CM | POA: Insufficient documentation

## 2017-05-14 LAB — CBC
HEMATOCRIT: 32.1 % — AB (ref 39.0–52.0)
HEMOGLOBIN: 9.8 g/dL — AB (ref 13.0–17.0)
MCH: 22.2 pg — ABNORMAL LOW (ref 26.0–34.0)
MCHC: 30.5 g/dL (ref 30.0–36.0)
MCV: 72.6 fL — ABNORMAL LOW (ref 78.0–100.0)
Platelets: 190 10*3/uL (ref 150–400)
RBC: 4.42 MIL/uL (ref 4.22–5.81)
RDW: 19.4 % — ABNORMAL HIGH (ref 11.5–15.5)
WBC: 4.2 10*3/uL (ref 4.0–10.5)

## 2017-05-14 LAB — RAPID URINE DRUG SCREEN, HOSP PERFORMED
AMPHETAMINES: NOT DETECTED
BARBITURATES: NOT DETECTED
Benzodiazepines: NOT DETECTED
COCAINE: NOT DETECTED
Opiates: NOT DETECTED
TETRAHYDROCANNABINOL: NOT DETECTED

## 2017-05-14 LAB — COMPREHENSIVE METABOLIC PANEL
ALBUMIN: 4.2 g/dL (ref 3.5–5.0)
ALK PHOS: 110 U/L (ref 38–126)
ALT: 59 U/L (ref 17–63)
AST: 127 U/L — ABNORMAL HIGH (ref 15–41)
Anion gap: 12 (ref 5–15)
BUN: <5 mg/dL — ABNORMAL LOW (ref 6–20)
CALCIUM: 9 mg/dL (ref 8.9–10.3)
CO2: 24 mmol/L (ref 22–32)
CREATININE: 0.77 mg/dL (ref 0.61–1.24)
Chloride: 104 mmol/L (ref 101–111)
GFR calc Af Amer: >60 mL/min (ref >60)
GFR calc non Af Amer: >60 mL/min (ref >60)
GLUCOSE: 100 mg/dL — AB (ref 65–99)
Potassium: 4.5 mmol/L (ref 3.5–5.1)
SODIUM: 140 mmol/L (ref 135–145)
Total Bilirubin: 0.5 mg/dL (ref 0.3–1.2)
Total Protein: 8.2 g/dL — ABNORMAL HIGH (ref 6.5–8.1)

## 2017-05-14 LAB — ETHANOL: Alcohol, Ethyl (B): 398 mg/dL (ref <10)

## 2017-05-14 LAB — ACETAMINOPHEN LEVEL

## 2017-05-14 LAB — SALICYLATE LEVEL: Salicylate Lvl: <7 mg/dL (ref 2.8–30.0)

## 2017-05-14 MED ORDER — LORAZEPAM 2 MG/ML IJ SOLN: 0.0000 mg | Freq: Four times a day (QID) | INTRAMUSCULAR | Status: DC

## 2017-05-14 MED ORDER — NICOTINE 21 MG/24HR TD PT24
21.0000 mg | MEDICATED_PATCH | Freq: Every day | TRANSDERMAL | Status: DC
  Administered 2017-05-14: 21 mg via TRANSDERMAL
  Filled 2017-05-14: qty 1

## 2017-05-14 MED ORDER — VITAMIN B-1 100 MG PO TABS
100.0000 mg | ORAL_TABLET | Freq: Every day | ORAL | Status: DC
  Administered 2017-05-14 – 2017-05-15 (×2): 100 mg via ORAL
  Filled 2017-05-14 (×2): qty 1

## 2017-05-14 MED ORDER — ONDANSETRON HCL 4 MG PO TABS
4.0000 mg | ORAL_TABLET | Freq: Three times a day (TID) | ORAL | Status: DC | PRN
  Administered 2017-05-14 – 2017-05-15 (×2): 4 mg via ORAL
  Filled 2017-05-14 (×2): qty 1

## 2017-05-14 MED ORDER — IBUPROFEN 400 MG PO TABS
600.0000 mg | ORAL_TABLET | Freq: Three times a day (TID) | ORAL | Status: DC | PRN
  Administered 2017-05-14: 600 mg via ORAL
  Filled 2017-05-14: qty 1

## 2017-05-14 MED ORDER — LORAZEPAM 1 MG PO TABS
0.0000 mg | ORAL_TABLET | Freq: Four times a day (QID) | ORAL | Status: DC
  Administered 2017-05-14 – 2017-05-15 (×2): 1 mg via ORAL
  Administered 2017-05-15: 2 mg via ORAL
  Filled 2017-05-14 (×4): qty 1

## 2017-05-14 MED ORDER — THIAMINE HCL 100 MG/ML IJ SOLN: 100.0000 mg | Freq: Every day | INTRAMUSCULAR | Status: DC

## 2017-05-14 MED ORDER — LORAZEPAM 2 MG/ML IJ SOLN: 0.0000 mg | Freq: Two times a day (BID) | INTRAMUSCULAR | Status: DC

## 2017-05-14 MED ORDER — LORAZEPAM 1 MG PO TABS: 0.0000 mg | ORAL_TABLET | Freq: Two times a day (BID) | ORAL | Status: DC

## 2017-05-14 NOTE — BH Assessment (Signed)
Received TTS consult request. Spoke to RN who said Pt is not in a room yet. ED staff will call TTS at [phone] when Pt is in a private area and tele-cart is ready.   Orpah Greek Anson Fret, LPC, [facility], [facility] Triage Specialist [phone]

## 2017-05-14 NOTE — ED Notes (Signed)
TTS assessment in process now.
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
## 2017-05-14 NOTE — ED Triage Notes (Signed)
Pt presents with [employer] for SI and ETOH intoxication; pt states plan to shoot self with gun or cut with knife; [employer] took weapon from patient PTA; pt arrives in wet clothing; pt states he is homeless and has no resources; pt states he drinks 4-5 40oz beers daily; pt also states he has been here before and given resources but no transportation; currently voluntary

## 2017-05-15 ENCOUNTER — Encounter (HOSPITAL_COMMUNITY): Payer: Self-pay

## 2017-05-15 ENCOUNTER — Inpatient Hospital Stay (HOSPITAL_COMMUNITY)
Admission: AD | Admit: 2017-05-15 | Discharge: 2017-05-19 | DRG: 885 | Disposition: A | Discharge status: Home / Self Care | Payer: Federal, State, Local not specified - Other | Source: Intra-hospital | Attending: Psychiatry | Admitting: Psychiatry

## 2017-05-15 ENCOUNTER — Other Ambulatory Visit: Payer: Self-pay

## 2017-05-15 DIAGNOSIS — F4310 Post-traumatic stress disorder, unspecified: Secondary | ICD-10-CM | POA: Diagnosis present

## 2017-05-15 DIAGNOSIS — K7010 Alcoholic hepatitis without ascites: Secondary | ICD-10-CM | POA: Diagnosis present

## 2017-05-15 DIAGNOSIS — Z825 Family history of asthma and other chronic lower respiratory diseases: Secondary | ICD-10-CM | POA: Diagnosis not present

## 2017-05-15 DIAGNOSIS — G4700 Insomnia, unspecified: Secondary | ICD-10-CM | POA: Diagnosis present

## 2017-05-15 DIAGNOSIS — Z590 Homelessness: Secondary | ICD-10-CM | POA: Diagnosis not present

## 2017-05-15 DIAGNOSIS — F17210 Nicotine dependence, cigarettes, uncomplicated: Secondary | ICD-10-CM | POA: Diagnosis not present

## 2017-05-15 DIAGNOSIS — Z833 Family history of diabetes mellitus: Secondary | ICD-10-CM | POA: Diagnosis not present

## 2017-05-15 DIAGNOSIS — R45851 Suicidal ideations: Secondary | ICD-10-CM | POA: Diagnosis not present

## 2017-05-15 DIAGNOSIS — F10239 Alcohol dependence with withdrawal, unspecified: Secondary | ICD-10-CM | POA: Diagnosis present

## 2017-05-15 DIAGNOSIS — Z91018 Allergy to other foods: Secondary | ICD-10-CM

## 2017-05-15 DIAGNOSIS — Y908 Blood alcohol level of 240 mg/100 ml or more: Secondary | ICD-10-CM | POA: Diagnosis not present

## 2017-05-15 DIAGNOSIS — Z888 Allergy status to other drugs, medicaments and biological substances status: Secondary | ICD-10-CM | POA: Diagnosis not present

## 2017-05-15 DIAGNOSIS — F332 Major depressive disorder, recurrent severe without psychotic features: Secondary | ICD-10-CM | POA: Diagnosis not present

## 2017-05-15 DIAGNOSIS — Z915 Personal history of self-harm: Secondary | ICD-10-CM | POA: Diagnosis not present

## 2017-05-15 DIAGNOSIS — Z818 Family history of other mental and behavioral disorders: Secondary | ICD-10-CM | POA: Diagnosis not present

## 2017-05-15 DIAGNOSIS — F1024 Alcohol dependence with alcohol-induced mood disorder: Secondary | ICD-10-CM | POA: Diagnosis present

## 2017-05-15 DIAGNOSIS — Z91410 Personal history of adult physical and sexual abuse: Secondary | ICD-10-CM

## 2017-05-15 DIAGNOSIS — Z811 Family history of alcohol abuse and dependence: Secondary | ICD-10-CM

## 2017-05-15 DIAGNOSIS — I10 Essential (primary) hypertension: Secondary | ICD-10-CM | POA: Diagnosis present

## 2017-05-15 DIAGNOSIS — Z801 Family history of malignant neoplasm of trachea, bronchus and lung: Secondary | ICD-10-CM | POA: Diagnosis not present

## 2017-05-15 DIAGNOSIS — R569 Unspecified convulsions: Secondary | ICD-10-CM | POA: Diagnosis present

## 2017-05-15 MED ORDER — LORAZEPAM 1 MG PO TABS
1.0000 mg | ORAL_TABLET | Freq: Four times a day (QID) | ORAL | Status: AC
  Administered 2017-05-15 – 2017-05-16 (×4): 1 mg via ORAL
  Filled 2017-05-15 (×4): qty 1

## 2017-05-15 MED ORDER — LORAZEPAM 1 MG PO TABS
1.0000 mg | ORAL_TABLET | Freq: Every day | ORAL | Status: AC
  Administered 2017-05-19: 1 mg via ORAL
  Filled 2017-05-15: qty 1

## 2017-05-15 MED ORDER — LOPERAMIDE HCL 2 MG PO CAPS: 2.0000 mg | ORAL_CAPSULE | ORAL | Status: AC | PRN

## 2017-05-15 MED ORDER — TRAZODONE HCL 50 MG PO TABS
50.0000 mg | ORAL_TABLET | Freq: Every evening | ORAL | Status: DC | PRN
  Administered 2017-05-15: 50 mg via ORAL
  Filled 2017-05-15: qty 1

## 2017-05-15 MED ORDER — GABAPENTIN 100 MG PO CAPS
100.0000 mg | ORAL_CAPSULE | Freq: Three times a day (TID) | ORAL | Status: DC
  Administered 2017-05-15 – 2017-05-16 (×3): 100 mg via ORAL
  Filled 2017-05-15 (×7): qty 1

## 2017-05-15 MED ORDER — LORAZEPAM 1 MG PO TABS
1.0000 mg | ORAL_TABLET | Freq: Three times a day (TID) | ORAL | Status: AC
  Administered 2017-05-16 – 2017-05-17 (×3): 1 mg via ORAL
  Filled 2017-05-15 (×3): qty 1

## 2017-05-15 MED ORDER — ONDANSETRON 4 MG PO TBDP: 4.0000 mg | ORAL_TABLET | Freq: Four times a day (QID) | ORAL | Status: AC | PRN

## 2017-05-15 MED ORDER — CITALOPRAM HYDROBROMIDE 10 MG PO TABS
10.0000 mg | ORAL_TABLET | Freq: Every day | ORAL | Status: DC
Start: 2017-05-16 — End: 2017-05-17
  Administered 2017-05-16 – 2017-05-17 (×2): 10 mg via ORAL
  Filled 2017-05-15 (×3): qty 1

## 2017-05-15 MED ORDER — LORAZEPAM 1 MG PO TABS
1.0000 mg | ORAL_TABLET | Freq: Four times a day (QID) | ORAL | Status: AC | PRN
  Filled 2017-05-15: qty 1

## 2017-05-15 MED ORDER — VITAMIN B-1 100 MG PO TABS
100.0000 mg | ORAL_TABLET | Freq: Every day | ORAL | Status: DC
  Administered 2017-05-16 – 2017-05-19 (×4): 100 mg via ORAL
  Filled 2017-05-15 (×5): qty 1

## 2017-05-15 MED ORDER — MAGNESIUM HYDROXIDE 400 MG/5ML PO SUSP: 30.0000 mL | Freq: Every day | ORAL | Status: DC | PRN

## 2017-05-15 MED ORDER — LORAZEPAM 1 MG PO TABS
1.0000 mg | ORAL_TABLET | Freq: Two times a day (BID) | ORAL | Status: AC
  Administered 2017-05-17 – 2017-05-18 (×2): 1 mg via ORAL
  Filled 2017-05-15 (×2): qty 1

## 2017-05-15 MED ORDER — ADULT MULTIVITAMIN W/MINERALS CH
1.0000 | ORAL_TABLET | Freq: Every day | ORAL | Status: DC
  Administered 2017-05-15 – 2017-05-19 (×5): 1 via ORAL
  Filled 2017-05-15 (×7): qty 1

## 2017-05-15 MED ORDER — HYDROXYZINE HCL 25 MG PO TABS
25.0000 mg | ORAL_TABLET | Freq: Four times a day (QID) | ORAL | Status: AC | PRN
  Administered 2017-05-15 – 2017-05-17 (×3): 25 mg via ORAL
  Filled 2017-05-15 (×3): qty 1

## 2017-05-15 MED ORDER — ALUM & MAG HYDROXIDE-SIMETH 200-200-20 MG/5ML PO SUSP: 30.0000 mL | ORAL | Status: DC | PRN

## 2017-05-15 NOTE — Progress Notes (Signed)
D.  Pt pleasant on approach, moderate s/s of withdrawal noted.  Pt did attend evening AA group, observed engaged in appropriate interaction with peers on the unit.  Pt is a new admission today, see admission note.  Pt denies SI/HI/AVH at this time.  A.  Support and encouragement offered, medication given as ordered.  R.  Pt remains safe on the unit, will continue to monitor.

## 2017-05-15 NOTE — Progress Notes (Signed)
Patient did attend the evening speaker AA meeting.  

## 2017-05-15 NOTE — Progress Notes (Signed)
Quency Tober is a 52 y.o. male Voluntary admitted for alcohol use and suiced attempt from [facility]. Pt stated he has been struggling with depression and has been self medicating with alcohol. Pt also stated he has been homeless for 17 years and has been living in a tent. Pt stated all his family is deceased except  for two sisters who does not want anything to do with him. Pt reports no support system, no insurance and so can not afford his medications. Pt would like to linked with long term txt upon discharge. Pt goal is to back on his medication and to learn hoe to live without drinking so much. Pt alert and oriented x 4, calm and cooperative with admission and answered all the questions. Denied SI/HI and AVH.  Consents signed, skin/belongings search completed and pt oriented to unit. Pt stable at this time. Pt given the opportunity to express concerns and ask questions. Pt given toiletries. Will continue to monitor.
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
## 2017-05-15 NOTE — BHH Suicide Risk Assessment (Signed)
[facility] Admission Suicide Risk Assessment   Nursing information obtained from:   patient and chart  Demographic factors:   52 year old male, homeless, unemployed  Current Mental Status:   see below Loss Factors:   homelessness , poor support system  Historical Factors:   alcohol dependence, depression Risk Reduction Factors:   resilience   Total Time spent with patient: 45 minutes Principal Problem:  Alcohol Induced Mood Disorder, Depressed, Alcohol Dependence  Diagnosis:  As above   Continued Clinical Symptoms:  Alcohol Use Disorder Identification Test Final Score (AUDIT): 30 The "Alcohol Use Disorders Identification Test", Guidelines for Use in Primary Care, Second Edition.  World Pharmacologist [facility]). Score between 0-7:  no or low risk or alcohol related problems. Score between 8-15:  moderate risk of alcohol related problems. Score between 16-19:  high risk of alcohol related problems. Score 20 or above:  warrants further diagnostic evaluation for alcohol dependence and treatment.   CLINICAL FACTORS:  52 year old male, homeless. Long history of Alcohol Dependence, as well as of depression, which he states is aggravated by alcohol but present even when sober. He presented via [facility] due to worsening depression and suicidal ideations of cutting self of shooting self . He has been drinking heavily and daily. (+) history of WDL seizures during previous WDLs .   Psychiatric Specialty Exam: Physical Exam  ROS  Blood pressure (!) 144/86, pulse (!) 108, temperature 99.3 F (37.4 C), temperature source Oral, resp. rate 18, height 5\' 8"  (1.727 m), weight 81.6 kg (180 lb), SpO2 98 %.Body mass index is 27.37 kg/m.  See admit note MSE    COGNITIVE FEATURES THAT CONTRIBUTE TO RISK:  Closed-mindedness and Loss of executive function    SUICIDE RISK:   Moderate:  Frequent suicidal ideation with limited intensity, and duration, some specificity in terms of plans, no associated intent, good  self-control, limited dysphoria/symptomatology, some risk factors present, and identifiable protective factors, including available and accessible social support.  PLAN OF CARE: Patient will be admitted to inpatient psychiatric unit for stabilization and safety. Will provide and encourage milieu participation. Provide medication management and maked adjustments as needed. Will also provide medication management to minimize risk of WDL.  Will follow daily.    I certify that inpatient services furnished can reasonably be expected to improve the patient's condition.   Jenne Campus, MD 05/15/2017, 3:36 PM

## 2017-05-15 NOTE — BHH Counselor (Signed)
Clinician re-assessed pt, who is currently experiencing ETOH w/d. Pt shares he is currently feeling nauseous and did not eat breakfast, though he was able to eat a [place] sandwich last night after arriving. Pt states he "tossed and turned" all night. Pt states he has "pretty much given up," though expresses no HI. Pt denies AVH.

## 2017-05-15 NOTE — Tx Team (Signed)
Initial Treatment Plan 05/15/2017 3:43 PM Barry Moore [id number]:[id number]    PATIENT STRESSORS: Financial difficulties Legal issue Medication change or noncompliance Occupational concerns Substance abuse   PATIENT STRENGTHS: Ability for insight Agricultural engineer for treatment/growth   PATIENT IDENTIFIED PROBLEMS:     Depression  Substance abuse  "get back on medications."  "Learn how to live without drinking so much."           DISCHARGE CRITERIA:  Ability to meet basic life and health needs Adequate post-discharge living arrangements Improved stabilization in mood, thinking, and/or behavior Motivation to continue treatment in a less acute level of care Reduction of life-threatening or endangering symptoms to within safe limits Verbal commitment to aftercare and medication compliance  PRELIMINARY DISCHARGE PLAN: Attend aftercare/continuing care group Attend PHP/IOP Attend 12-step recovery group Outpatient therapy Placement in alternative living arrangements  PATIENT/FAMILY INVOLVEMENT: This treatment plan has been presented to and reviewed with the patient, Barry Moore, and/or family member.  The patient and family have been given the opportunity to ask questions and make suggestions.  Barry Phoenix, RN 05/15/2017, 3:43 PM

## 2017-05-15 NOTE — Progress Notes (Signed)
Per Doree Albee, pt has been accepted to [facility] bed 300/02. Accepting provider is Lindon Romp NP, Attending provider is Dr. Parke Poisson. Patient can arrive by 1300. Number for report is [phone].   Maxie Better, MSW, LCSW Clinical Social Worker 05/15/2017 9:00 AM

## 2017-05-15 NOTE — ED Notes (Signed)
Pt voiced agreement and understanding of acceptance to [facility]. Pt signed consent forms - copy faxed to [facility], copy sent to Medical Records, and original placed in folder for [facility].

## 2017-05-15 NOTE — ED Notes (Signed)
Re-TTS being performed.  

## 2017-05-15 NOTE — ED Provider Notes (Signed)
Pt has been accepted at [facility] and there is ready for transfer.  Vitals stable.     Dorie Rank, MD 05/15/17 1213

## 2017-05-16 DIAGNOSIS — G4700 Insomnia, unspecified: Secondary | ICD-10-CM

## 2017-05-16 DIAGNOSIS — R450 Nervousness: Secondary | ICD-10-CM

## 2017-05-16 DIAGNOSIS — F419 Anxiety disorder, unspecified: Secondary | ICD-10-CM

## 2017-05-16 DIAGNOSIS — F17210 Nicotine dependence, cigarettes, uncomplicated: Secondary | ICD-10-CM

## 2017-05-16 DIAGNOSIS — F1490 Cocaine use, unspecified, uncomplicated: Secondary | ICD-10-CM

## 2017-05-16 DIAGNOSIS — R45851 Suicidal ideations: Secondary | ICD-10-CM

## 2017-05-16 MED ORDER — TRAZODONE HCL 100 MG PO TABS
100.0000 mg | ORAL_TABLET | Freq: Every evening | ORAL | Status: DC | PRN
  Administered 2017-05-16 – 2017-05-18 (×3): 100 mg via ORAL
  Filled 2017-05-16: qty 7
  Filled 2017-05-16 (×3): qty 1

## 2017-05-16 MED ORDER — GABAPENTIN 300 MG PO CAPS
300.0000 mg | ORAL_CAPSULE | Freq: Three times a day (TID) | ORAL | Status: DC
  Administered 2017-05-16 – 2017-05-19 (×9): 300 mg via ORAL
  Filled 2017-05-16 (×4): qty 1
  Filled 2017-05-16: qty 21
  Filled 2017-05-16 (×4): qty 1
  Filled 2017-05-16: qty 21
  Filled 2017-05-16 (×2): qty 1
  Filled 2017-05-16: qty 21
  Filled 2017-05-16 (×2): qty 1

## 2017-05-16 NOTE — Progress Notes (Signed)
Patient ID: Barry Moore, male   DOB: Aug 28, 1965, 52 y.o.   MRN: [id number]  Pt currently presents with a blunted affect and appropriate behavior. Pt endorses ongoing anxiety about relationship and thoughts of wanting to harm (HI) someone "not here." Pt reports good sleep with current medication regimen.   Pt provided with medications per providers orders. Pt's labs and vitals were monitored throughout the night. Pt given a 1:1 about emotional and mental status. Pt supported and encouraged to express concerns and questions. Pt educated on medications.  Pt's safety ensured with 15 minute and environmental checks. Pt currently denies SI and A/V hallucinations. Pt verbally agrees to seek staff if SI or A/VH occurs and to consult with staff before acting on any harmful thoughts. Will continue POC.

## 2017-05-16 NOTE — BHH Group Notes (Signed)
Pt attended spiritual care group on grief and loss facilitated by chaplain Jerene Pitch   Group opened with brief discussion and psycho-social ed around grief and loss in relationships and in relation to self - identifying life patterns, circumstances, changes that cause losses. Established group norm of speaking from own life experience. Group goal of establishing open and affirming space for members to share loss and experience with grief, normalize grief experience and provide psycho social education and grief support.  Donnald (Buddy) was present throughout group.  He was attentive to group discussion.  Did not engage in discussion.    [facility] / [place], MDiv

## 2017-05-16 NOTE — BHH Suicide Risk Assessment (Signed)
[facility] INPATIENT:  Family/Significant Other Suicide Prevention Education  Suicide Prevention Education:  Patient Refusal for Family/Significant Other Suicide Prevention Education: The patient Barry Moore has refused to provide written consent for family/significant other to be provided Family/Significant Other Suicide Prevention Education during admission and/or prior to discharge.  Physician notified.  SPE completed with pt, as pt refused to consent to family contact. SPI pamphlet provided to pt and pt was encouraged to share information with support network, ask questions, and talk about any concerns relating to SPE. Pt denies access to guns/firearms and verbalized understanding of information provided. Mobile Crisis information also provided to pt.   Marielle Mantione N Smart LCSW 05/16/2017, 10:45 AM

## 2017-05-16 NOTE — Progress Notes (Addendum)
[facility] MD Progress Note  05/16/2017 1:51 PM Barry Moore  MRN:  [id number]   Subjective:  Patient reports that he is doing ok today. He reports his depression at 8/10  And his anxiety at 8/10. He reports intermittent SI, but denies any HI/AVH and contracts for safety. He reports that he doesn't want another residential treatment program and just wants to be set up with outpatient services.He reports poor sleep and would like to have his Trazodone increased as well. He denies any medication side effects. He reports feeling "jittery inside" due to withdrawals.  Objective: Patient's chart and findings reviewed and discussed with treatment team. Patient is pleasant and cooperative. He presents in the day room and has been interacting appropriately and attending groups. Will increase Trazodone for improved sleep.   Principal Problem: Severe recurrent major depression without psychotic features ([place]) Diagnosis:   Patient Active Problem List   Diagnosis Date Noted  . Severe recurrent major depression without psychotic features ([place]) [F33.2] 05/15/2017  . Anemia [D64.9] 03/18/2017  . Acute blood loss anemia [D62] 02/21/2017  . UGI bleed [K92.2] 02/08/2017  . Alcohol withdrawal ([place]) [F10.239] 02/08/2017  . Cocaine abuse ([place]) [F14.10] 11/18/2016  . GI bleed [K92.2] 11/01/2016  . Alcohol abuse [F10.10]   . Major depressive disorder, recurrent severe without psychotic features ([place]) [F33.2] 07/06/2016  . Alcohol-induced mood disorder ([place]) [F10.94] 06/15/2016  . Seizure ([place]) [R56.9] 05/26/2016  . Tobacco abuse [Z72.0] 05/26/2016  . Homeless [Z59.0] 05/26/2016  . Essential hypertension [I10] 05/26/2016  . Alcohol dependence with alcohol-induced mood disorder ([place]) [F10.24] 02/14/2016  . Severe alcohol withdrawal without perceptual disturbances without complication ([place]) [G29.528] 02/14/2016  . Alcoholic hepatitis without ascites [K70.10] 02/14/2016  . Thrombocytopenia ([place]) [D69.6] 02/14/2016    Total Time spent with patient: 30 minutes  Past Psychiatric History: See H&P  Past Medical History:  Past Medical History:  Diagnosis Date  . Alcoholic hepatitis   . Depression   . ETOH abuse   . Hypertension   . Pancreatitis   . Seizures ([place])     Past Surgical History:  Procedure Laterality Date  . ESOPHAGOGASTRODUODENOSCOPY (EGD) WITH PROPOFOL N/A 11/02/2016   Procedure: ESOPHAGOGASTRODUODENOSCOPY (EGD) WITH PROPOFOL;  Surgeon: Carol Ada, MD;  Location: [facility];  Service: Endoscopy;  Laterality: N/A;  . HERNIA REPAIR    . LEG SURGERY     Family History:  Family History  Problem Relation Age of Onset  . Diabetes Mother   . Alcoholism Mother   . Emphysema Father   . Lung cancer Father   . Alcoholism Father    Family Psychiatric  History: See H&P Social History:  Social History   Substance and Sexual Activity  Alcohol Use Yes   Comment: 4-5 40oz beers daily     Social History   Substance and Sexual Activity  Drug Use Yes  . Frequency: 3.0 times per week  . Types: Cocaine   Comment: last use today 2 weeks ago (04-09-17)     Social History   Socioeconomic History  . Marital status: Divorced    Spouse name: None  . Number of children: None  . Years of education: None  . Highest education level: None  Social Needs  . Financial resource strain: None  . Food insecurity - worry: None  . Food insecurity - inability: None  . Transportation needs - medical: None  . Transportation needs - non-medical: None  Occupational History  . None  Tobacco Use  . Smoking status: Current Every  Day Smoker    Packs/day: 1.00    Types: Cigarettes  . Smokeless tobacco: Never Used  Substance and Sexual Activity  . Alcohol use: Yes    Comment: 4-5 40oz beers daily  . Drug use: Yes    Frequency: 3.0 times per week    Types: Cocaine    Comment: last use today 2 weeks ago (04-09-17)   . Sexual activity: Not Currently  Other Topics Concern  . None  Social History  Narrative  . None   Additional Social History:    Pain Medications: None Prescriptions: None Over the Counter: Tylenol when needed. History of alcohol / drug use?: Yes Longest period of sobriety (when/how long): Four years when in prison Negative Consequences of Use: Personal relationships, Museum/gallery curator, Work / School Withdrawal Symptoms: Nausea / Vomiting, Tremors, Patient aware of relationship between substance abuse and physical/medical complications, Seizures, Sweats Onset of Seizures: ETOH withdrawal Date of most recent seizure: One week ago Name of Substance 1: ETOH 1 - Age of First Use: Teens 1 - Amount (size/oz): 1/5 liquor and four or five forties 1 - Frequency: Daily 1 - Duration: Ongoing  1 - Last Use / Amount: 05/14/17, One fifth of liquor and four 40-ounce beers                  Sleep: Fair  Appetite:  Good  Current Medications: Current Facility-Administered Medications  Medication Dose Route Frequency Provider Last Rate Last Dose  . alum & mag hydroxide-simeth (MAALOX/MYLANTA) 200-200-20 MG/5ML suspension 30 mL  30 mL Oral Q4H PRN Lindon Romp A, NP      . citalopram (CELEXA) tablet 10 mg  10 mg Oral Daily Cobos, Myer Peer, MD   10 mg at 05/16/17 0827  . gabapentin (NEURONTIN) capsule 100 mg  100 mg Oral TID Cobos, Myer Peer, MD   100 mg at 05/16/17 1347  . hydrOXYzine (ATARAX/VISTARIL) tablet 25 mg  25 mg Oral Q6H PRN Lindon Romp A, NP   25 mg at 05/15/17 2202  . loperamide (IMODIUM) capsule 2-4 mg  2-4 mg Oral PRN Lindon Romp A, NP      . LORazepam (ATIVAN) tablet 1 mg  1 mg Oral Q6H PRN Lindon Romp A, NP      . LORazepam (ATIVAN) tablet 1 mg  1 mg Oral TID Rozetta Nunnery, NP       Followed by  . [START ON 05/17/2017] LORazepam (ATIVAN) tablet 1 mg  1 mg Oral BID Rozetta Nunnery, NP       Followed by  . [START ON 05/19/2017] LORazepam (ATIVAN) tablet 1 mg  1 mg Oral Daily Lindon Romp A, NP      . magnesium hydroxide (MILK OF MAGNESIA) suspension 30 mL   30 mL Oral Daily PRN Lindon Romp A, NP      . multivitamin with minerals tablet 1 tablet  1 tablet Oral Daily Lindon Romp A, NP   1 tablet at 05/16/17 0827  . ondansetron (ZOFRAN-ODT) disintegrating tablet 4 mg  4 mg Oral Q6H PRN Lindon Romp A, NP      . thiamine (VITAMIN B-1) tablet 100 mg  100 mg Oral Daily Lindon Romp A, NP   100 mg at 05/16/17 0827  . traZODone (DESYREL) tablet 100 mg  100 mg Oral QHS PRN Money, Lowry Ram, FNP        Lab Results:  Results for orders placed or performed during the hospital encounter of 05/14/17 (from the past 48  hour(s))  Rapid urine drug screen (hospital performed)     Status: None   Collection Time: 05/14/17  6:04 PM  Result Value Ref Range   Opiates NONE DETECTED NONE DETECTED   Cocaine NONE DETECTED NONE DETECTED   Benzodiazepines NONE DETECTED NONE DETECTED   Amphetamines NONE DETECTED NONE DETECTED   Tetrahydrocannabinol NONE DETECTED NONE DETECTED   Barbiturates NONE DETECTED NONE DETECTED    Comment: (NOTE) DRUG SCREEN FOR MEDICAL PURPOSES ONLY.  IF CONFIRMATION IS NEEDED FOR ANY PURPOSE, NOTIFY LAB WITHIN 5 DAYS. LOWEST DETECTABLE LIMITS FOR URINE DRUG SCREEN Drug Class                     Cutoff (ng/mL) Amphetamine and metabolites    1000 Barbiturate and metabolites    200 Benzodiazepine                 778 Tricyclics and metabolites     300 Opiates and metabolites        300 Cocaine and metabolites        300 THC                            50 Performed at [facility], [place] [address] [place], [place] [address]   Comprehensive metabolic panel     Status: Abnormal   Collection Time: 05/14/17  6:05 PM  Result Value Ref Range   Sodium 140 135 - 145 mmol/L   Potassium 4.5 3.5 - 5.1 mmol/L   Chloride 104 101 - 111 mmol/L   CO2 24 22 - 32 mmol/L   Glucose, Bld 100 (H) 65 - 99 mg/dL   BUN <5 (L) 6 - 20 mg/dL   Creatinine, Ser 0.77 0.61 - 1.24 mg/dL   Calcium 9.0 8.9 - 10.3 mg/dL   Total Protein 8.2 (H) 6.5 - 8.1 g/dL    Albumin 4.2 3.5 - 5.0 g/dL   AST 127 (H) 15 - 41 U/L   ALT 59 17 - 63 U/L   Alkaline Phosphatase 110 38 - 126 U/L   Total Bilirubin 0.5 0.3 - 1.2 mg/dL   GFR calc non Af Amer >60 >60 mL/min   GFR calc Af Amer >60 >60 mL/min    Comment: (NOTE) The eGFR has been calculated using the CKD EPI equation. This calculation has not been validated in all clinical situations. eGFR's persistently <60 mL/min signify possible Chronic Kidney Disease.    Anion gap 12 5 - 15    Comment: Performed at [facility] [address] [place], [place] [address]  Ethanol     Status: Abnormal   Collection Time: 05/14/17  6:05 PM  Result Value Ref Range   Alcohol, Ethyl (B) 398 (HH) <10 mg/dL    Comment: CRITICAL RESULT CALLED TO, READ BACK BY AND VERIFIED WITH: LOWEST DETECTABLE LIMIT FOR SERUM ALCOHOL IS 10 mg/dL FOR MEDICAL PURPOSES ONLY Melonie Florida, RN [phone] 05/14/2017 TH[place]ON V Performed at [facility], [place] [address], [place], [place] [address]   Salicylate level     Status: None   Collection Time: 05/14/17  6:05 PM  Result Value Ref Range   Salicylate Lvl <6.1 2.8 - 30.0 mg/dL    Comment: Performed at [facility] [address] [place],  [address]  Acetaminophen level     Status: Abnormal   Collection Time: 05/14/17  6:05 PM  Result Value Ref  Range   Acetaminophen (Tylenol), Serum <10 (L) 10 - 30 ug/mL    Comment:        THERAPEUTIC CONCENTRATIONS VARY SIGNIFICANTLY. A RANGE OF 10-30 ug/mL MAY BE AN EFFECTIVE CONCENTRATION FOR MANY PATIENTS. HOWEVER, SOME ARE BEST TREATED AT CONCENTRATIONS OUTSIDE THIS RANGE. ACETAMINOPHEN CONCENTRATIONS >150 ug/mL AT 4 HOURS AFTER INGESTION AND >50 ug/mL AT 12 HOURS AFTER INGESTION ARE OFTEN ASSOCIATED WITH TOXIC REACTIONS. Performed at [facility], [place] [address] [place], [place] [address]   cbc     Status: Abnormal   Collection Time: 05/14/17  6:05 PM  Result Value Ref Range   WBC 4.2 4.0 - 10.5 K/uL   RBC  4.42 4.22 - 5.81 MIL/uL   Hemoglobin 9.8 (L) 13.0 - 17.0 g/dL   HCT 32.1 (L) 39.0 - 52.0 %   MCV 72.6 (L) 78.0 - 100.0 fL   MCH 22.2 (L) 26.0 - 34.0 pg   MCHC 30.5 30.0 - 36.0 g/dL   RDW 19.4 (H) 11.5 - 15.5 %   Platelets 190 150 - 400 K/uL    Comment: Performed at [facility], [place] [address] [place], [place] [address]    Blood Alcohol level:  Lab Results  Component Value Date   ETH 398 [facility]) 05/14/2017   ETH 363 (HH) 62/95/2841    Metabolic Disorder Labs: Lab Results  Component Value Date   HGBA1C 5.2 03/07/2016   MPG 103 03/07/2016   Lab Results  Component Value Date   PROLACTIN 23.2 (H) 03/07/2016   Lab Results  Component Value Date   CHOL 215 (H) 03/07/2016   TRIG 82 03/07/2016   HDL 105 03/07/2016   CHOLHDL 2.0 03/07/2016   VLDL 16 03/07/2016   LDLCALC 94 03/07/2016    Physical Findings: AIMS: Facial and Oral Movements Muscles of Facial Expression: None, normal Lips and Perioral Area: None, normal Jaw: None, normal Tongue: None, normal,Extremity Movements Upper (arms, wrists, hands, fingers): None, normal Lower (legs, knees, ankles, toes): None, normal, Trunk Movements Neck, shoulders, hips: None, normal, Overall Severity Severity of abnormal movements (highest score from questions above): None, normal Incapacitation due to abnormal movements: None, normal Patient's awareness of abnormal movements (rate only patient's report): No Awareness, Dental Status Current problems with teeth and/or dentures?: No Does patient usually wear dentures?: No  CIWA:  CIWA-Ar Total: 2 COWS:  COWS Total Score: 8  Musculoskeletal: Strength & Muscle Tone: within normal limits Gait & Station: normal Patient leans: N/A  Psychiatric Specialty Exam: Physical Exam  Nursing note and vitals reviewed. Constitutional: He is oriented to person, place, and time. He appears well-developed and well-nourished.  Cardiovascular: Normal rate.  Respiratory: Effort normal.   Musculoskeletal: Normal range of motion.  Neurological: He is alert and oriented to person, place, and time.  Skin: Skin is warm.    Review of Systems  Constitutional: Negative.   HENT: Negative.   Eyes: Negative.   Respiratory: Negative.   Cardiovascular: Negative.   Gastrointestinal: Negative.   Genitourinary: Negative.   Musculoskeletal: Negative.   Skin: Negative.   Neurological: Negative.   Endo/Heme/Allergies: Negative.   Psychiatric/Behavioral: Positive for depression, substance abuse and suicidal ideas. Negative for hallucinations. The patient is nervous/anxious.     Blood pressure (!) 148/88, pulse 82, temperature 98.2 F (36.8 C), temperature source Oral, resp. rate 18, height 5' 8"  (1.727 m), weight 81.6 kg (180 lb), SpO2 98 %.Body mass index is 27.37 kg/m.  General Appearance: Casual  Eye Contact:  Good  Speech:  Clear and Coherent and Normal Rate  Volume:  Normal  Mood:  Anxious and Depressed  Affect:  Congruent  Thought Process:  Goal Directed and Descriptions of Associations: Intact  Orientation:  Full (Time, Place, and Person)  Thought Content:  WDL  Suicidal Thoughts:  Yes.  without intent/plan intermittent  Homicidal Thoughts:  No  Memory:  Immediate;   Good Recent;   Good Remote;   Good  Judgement:  Fair  Insight:  Good  Psychomotor Activity:  Normal  Concentration:  Concentration: Good and Attention Span: Good  Recall:  Good  Fund of Knowledge:  Good  Language:  Good  Akathisia:  No  Handed:  Right  AIMS (if indicated):     Assets:  Communication Skills Desire for Improvement Financial Resources/Insurance Social Support  ADL's:  Intact  Cognition:  WNL  Sleep:  Number of Hours: 4.25   Problems Addressed: MDD severe Alcohol dependence  Treatment Plan Summary: Daily contact with patient to assess and evaluate symptoms and progress in treatment, Medication management and Plan is to:  -Increase Trazodone 100 mg PO QHS PRN for  insomnia -Continue Celexa 10 mg PO [place] for mood stability -Continue Ativan Detox Protocol -Increase Gabapentin 300 mg PO TID for withdrawal symptoms -Continue Vistaril 25 mg PO Q6H PRN for anxiety -Encourage group therapy participation  Lewis Shock, FNP 05/16/2017, 1:51 PM   Agree with NP Progress Note
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
## 2017-05-16 NOTE — Progress Notes (Signed)
DAR NOTE: Pt present with flat affect and depressed mood in the unit. Pt has been isolating himself not interacting much. Pt has been encouraged to take a shower due to bad odor, but not willing to shower. Pt denies physical pain, took all his meds as scheduled. As per self inventory, pt had a good night sleep, good appetite, normal energy, and good concentration. Pt rate depression at 08, hopeless ness at 08, and anxiety at 08. Pt's safety ensured with 15 minute and environmental checks. Pt currently denies SI/HI and A/V hallucinations. Pt verbally agrees to seek staff if SI/HI or A/VH occurs and to consult with staff before acting on these thoughts. Will continue POC.

## 2017-05-16 NOTE — Progress Notes (Signed)
Adult Psychoeducational Group Note  Date:  05/16/2017 Time:  10:48 AM  Group Topic/Focus:  Wellness Toolbox:   The focus of this group is to discuss various aspects of wellness, balancing those aspects and exploring ways to increase the ability to experience wellness.  Patients will create a wellness toolbox for use upon discharge.  Participation Level:  None  Participation Quality:  Inattentive  Affect:  Flat  Cognitive:  Alert  Insight: None  Engagement in Group:  None  Modes of Intervention:  Discussion and Education  Additional Comments:  Pt was able to attend group this morning but did not want to participate.  [name] E 05/16/2017, 10:48 AM

## 2017-05-16 NOTE — [facility] Group Notes (Signed)
LCSW Group Therapy Note   05/16/2017 1:15pm   Type of Therapy and Topic:  Group Therapy:  Overcoming Obstacles   Participation Level:  Active   Description of Group:    In this group patients will be encouraged to explore what they see as obstacles to their own wellness and recovery. They will be guided to discuss their thoughts, feelings, and behaviors related to these obstacles. The group will process together ways to cope with barriers, with attention given to specific choices patients can make. Each patient will be challenged to identify changes they are motivated to make in order to overcome their obstacles. This group will be process-oriented, with patients participating in exploration of their own experiences as well as giving and receiving support and challenge from other group members.   Therapeutic Goals: 1. Patient will identify personal and current obstacles as they relate to admission. 2. Patient will identify barriers that currently interfere with their wellness or overcoming obstacles.  3. Patient will identify feelings, thought process and behaviors related to these barriers. 4. Patient will identify two changes they are willing to make to overcome these obstacles:      Summary of Patient Progress   Shelley was attentive and engaged during today's processing group. He shared that his biggest obstacle is maintaining his sobriety. He expressed some interest in [facility] and was provided with information about this program. Iverson shared that he has been living in a tent and finds it difficult to remain sober but is not able to commit to residential treatment at this time. Raiden shared resources and experiences with others in the group setting and continues to make progress in the group setting.    Therapeutic Modalities:   Cognitive Behavioral Therapy Solution Focused Therapy Motivational Interviewing Relapse Prevention Therapy  Kimber Relic Smart, LCSW 05/16/2017  2:58 PM

## 2017-05-16 NOTE — Tx Team (Signed)
Interdisciplinary Treatment and Diagnostic Plan Update  05/16/2017 Time of Session: 0830AM Barry Moore MRN: [id number]  Principal Diagnosis: MDD, recurrent, severe without psychotic features  Secondary Diagnoses: Active Problems:   Severe recurrent major depression without psychotic features (HCC)   Current Medications:  Current Facility-Administered Medications  Medication Dose Route Frequency Provider Last Rate Last Dose  . alum & mag hydroxide-simeth (MAALOX/MYLANTA) 200-200-20 MG/5ML suspension 30 mL  30 mL Oral Q4H PRN Lindon Romp A, NP      . citalopram (CELEXA) tablet 10 mg  10 mg Oral Daily Cobos, Myer Peer, MD   10 mg at 05/16/17 0827  . gabapentin (NEURONTIN) capsule 100 mg  100 mg Oral TID Cobos, Myer Peer, MD   100 mg at 05/16/17 0827  . hydrOXYzine (ATARAX/VISTARIL) tablet 25 mg  25 mg Oral Q6H PRN Lindon Romp A, NP   25 mg at 05/15/17 2202  . loperamide (IMODIUM) capsule 2-4 mg  2-4 mg Oral PRN Lindon Romp A, NP      . LORazepam (ATIVAN) tablet 1 mg  1 mg Oral Q6H PRN Lindon Romp A, NP      . LORazepam (ATIVAN) tablet 1 mg  1 mg Oral QID Lindon Romp A, NP   1 mg at 05/16/17 0827   Followed by  . LORazepam (ATIVAN) tablet 1 mg  1 mg Oral TID Rozetta Nunnery, NP       Followed by  . [START ON 05/17/2017] LORazepam (ATIVAN) tablet 1 mg  1 mg Oral BID Rozetta Nunnery, NP       Followed by  . [START ON 05/19/2017] LORazepam (ATIVAN) tablet 1 mg  1 mg Oral Daily Lindon Romp A, NP      . magnesium hydroxide (MILK OF MAGNESIA) suspension 30 mL  30 mL Oral Daily PRN Lindon Romp A, NP      . multivitamin with minerals tablet 1 tablet  1 tablet Oral Daily Lindon Romp A, NP   1 tablet at 05/16/17 0827  . ondansetron (ZOFRAN-ODT) disintegrating tablet 4 mg  4 mg Oral Q6H PRN Lindon Romp A, NP      . thiamine (VITAMIN B-1) tablet 100 mg  100 mg Oral Daily Lindon Romp A, NP   100 mg at 05/16/17 0827  . traZODone (DESYREL) tablet 50 mg  50 mg Oral QHS PRN Rozetta Nunnery, NP    50 mg at 05/15/17 2202   PTA Medications: Medications Prior to Admission  Medication Sig Dispense Refill Last Dose  . folic acid (FOLVITE) 1 MG tablet Take 1 tablet (1 mg total) by mouth daily. (Patient not taking: Reported on 04/26/2017) 30 tablet 0 Not Taking at Unknown time    Patient Stressors: Financial difficulties Legal issue Medication change or noncompliance Occupational concerns Substance abuse  Patient Strengths: Ability for insight Agricultural engineer for treatment/growth  Treatment Modalities: Medication Management, Group therapy, Case management,  1 to 1 session with clinician, Psychoeducation, Recreational therapy.   Physician Treatment Plan for Primary Diagnosis: MDD, recurrent, severe without psychotic features Long Term Goal(s): Improvement in symptoms so as ready for discharge Improvement in symptoms so as ready for discharge   Short Term Goals: Ability to identify triggers associated with substance abuse/mental health issues will improve Ability to identify changes in lifestyle to reduce recurrence of condition will improve Ability to verbalize feelings will improve Ability to disclose and discuss suicidal ideas Ability to demonstrate self-control will improve Ability to identify and develop effective coping behaviors will improve  Ability to maintain clinical measurements within normal limits will improve Compliance with prescribed medications will improve  Medication Management: Evaluate patient's response, side effects, and tolerance of medication regimen.  Therapeutic Interventions: 1 to 1 sessions, Unit Group sessions and Medication administration.  Evaluation of Outcomes: Not Met  Physician Treatment Plan for Secondary Diagnosis: Active Problems:   Severe recurrent major depression without psychotic features ([place])  Long Term Goal(s): Improvement in symptoms so as ready for discharge Improvement in symptoms so as ready for discharge    Short Term Goals: Ability to identify triggers associated with substance abuse/mental health issues will improve Ability to identify changes in lifestyle to reduce recurrence of condition will improve Ability to verbalize feelings will improve Ability to disclose and discuss suicidal ideas Ability to demonstrate self-control will improve Ability to identify and develop effective coping behaviors will improve Ability to maintain clinical measurements within normal limits will improve Compliance with prescribed medications will improve     Medication Management: Evaluate patient's response, side effects, and tolerance of medication regimen.  Therapeutic Interventions: 1 to 1 sessions, Unit Group sessions and Medication administration.  Evaluation of Outcomes: Not Met   RN Treatment Plan for Primary Diagnosis:MDD, recurrent, severe without psychotic features  Long Term Goal(s): Knowledge of disease and therapeutic regimen to maintain health will improve  Short Term Goals: Ability to remain free from injury will improve, Ability to demonstrate self-control, Ability to disclose and discuss suicidal ideas and Ability to identify and develop effective coping behaviors will improve  Medication Management: RN will administer medications as ordered by provider, will assess and evaluate patient's response and provide education to patient for prescribed medication. RN will report any adverse and/or side effects to prescribing provider.  Therapeutic Interventions: 1 on 1 counseling sessions, Psychoeducation, Medication administration, Evaluate responses to treatment, Monitor vital signs and CBGs as ordered, Perform/monitor CIWA, COWS, AIMS and Fall Risk screenings as ordered, Perform wound care treatments as ordered.  Evaluation of Outcomes: Not Met   LCSW Treatment Plan for Primary Diagnosis: MDD, recurrent, severe without psychotic features Long Term Goal(s): Safe transition to appropriate next  level of care at discharge, Engage patient in therapeutic group addressing interpersonal concerns.  Short Term Goals: Engage patient in aftercare planning with referrals and resources, Facilitate patient progression through stages of change regarding substance use diagnoses and concerns and Identify triggers associated with mental health/substance abuse issues  Therapeutic Interventions: Assess for all discharge needs, 1 to 1 time with Social worker, Explore available resources and support systems, Assess for adequacy in community support network, Educate family and significant other(s) on suicide prevention, Complete Psychosocial Assessment, Interpersonal group therapy.  Evaluation of Outcomes: Not Met   Progress in Treatment: Attending groups: No.  New to unit. Continuing to assess.  Participating in groups: No. Taking medication as prescribed: Yes. Toleration medication: Yes. Family/Significant other contact made: No, will contact:  family member if patient consents to collateral contact.  Patient understands diagnosis: Yes. Discussing patient identified problems/goals with staff: Yes. Medical problems stabilized or resolved: Yes. Denies suicidal/homicidal ideation: Yes. Issues/concerns per patient self-inventory: No. Other: n/a   New problem(s) identified: No, Describe:  n/a  New Short Term/Long Term Goal(s): detox, medication management for mood stabilization; elimination of SI thoughts; development of comprehensive mental wellness/sobriety plan.   Patient Goal: "to get back on my medication and get some outpatient help."  Discharge Plan or Barriers: CSW assessing. Pt reports that he has been homeless and living in tent for 17 yrs and  has no family supports and no money to afford medications. He is interested in long term residential treatment. [place] pamphlet and AA/NA list provided for additional community supports.   Reason for Continuation of Hospitalization:  Anxiety Depression Medication stabilization Suicidal ideation Withdrawal symptoms  Estimated Length of Stay: Thursday, 05/19/17  Attendees: Patient: Barry Moore 05/16/2017 8:45 AM  Physician: Dr. Parke Poisson MD 05/16/2017 8:45 AM  Nursing: Opal Sidles RN 05/16/2017 8:45 AM  RN Care Manager: Lars Pinks CM 05/16/2017 8:45 AM  Social Worker: Maxie Better, LCSW 05/16/2017 8:45 AM  Recreational Therapist: x 05/16/2017 8:45 AM  Other: Lindell Spar NP; Ricky Ala NP; Lindell Spar NP 05/16/2017 8:45 AM  Other:  05/16/2017 8:45 AM  Other: 05/16/2017 8:45 AM    Scribe for Treatment Team: [place], LCSW 05/16/2017 8:45 AM

## 2017-05-16 NOTE — Progress Notes (Signed)
Pt attended AA group this evening.  

## 2017-05-17 MED ORDER — LISINOPRIL 10 MG PO TABS
10.0000 mg | ORAL_TABLET | Freq: Every day | ORAL | Status: DC
  Administered 2017-05-17 – 2017-05-19 (×3): 10 mg via ORAL
  Filled 2017-05-17: qty 7
  Filled 2017-05-17: qty 2
  Filled 2017-05-17 (×4): qty 1

## 2017-05-17 MED ORDER — CITALOPRAM HYDROBROMIDE 20 MG PO TABS
20.0000 mg | ORAL_TABLET | Freq: Every day | ORAL | Status: DC
  Administered 2017-05-18 – 2017-05-19 (×2): 20 mg via ORAL
  Filled 2017-05-17 (×3): qty 1
  Filled 2017-05-17: qty 7

## 2017-05-17 NOTE — [facility] Group Notes (Signed)
[facility] Mental Health Association Group Therapy 05/17/2017 1:15pm  Type of Therapy: Mental Health Association Presentation  Participation Level: Active  Participation Quality: Attentive  Affect: Appropriate  Cognitive: Oriented  Insight: Developing/Improving  Engagement in Therapy: Engaged  Modes of Intervention: Discussion, Education and Socialization  Summary of Progress/Problems: [place] Speaker came to talk about his personal journey with mental health. The pt processed ways by which to relate to the speaker. [place] speaker provided handouts and educational information pertaining to groups and services offered by the [facility]. Pt was engaged in speaker's presentation and was receptive to resources provided.    [employer], LCSW 05/17/2017 3:24 PM

## 2017-05-17 NOTE — Progress Notes (Addendum)
Patient ID: Barry Moore, male   DOB: 08/16/1965, 52 y.o.   MRN: [id number]  Pt currently presents with a flat affect and depressed, guarded behavior. Pt reports ambivalence about his sobriety post discharge. Pt states "I need to get new friends, my best friend is a guy who lives in a tent next to me, he is not sober." Pt reports intermittent sleep with current medication regimen.   Pt provided with medications per providers orders. Pt's labs and vitals were monitored throughout the night. Pt given a 1:1 about emotional and mental status. Pt supported and encouraged to express concerns and questions. Pt educated on medications. Encouraged to consult with writer if unable to sleep.   Pt's safety ensured with 15 minute and environmental checks. Pt currently denies SI/HI and A/V hallucinations. Pt verbally agrees to seek staff if SI/HI or A/VH occurs and to consult with staff before acting on any harmful thoughts. Pt reports he is going to be following up with [facility] post discharge. Will continue POC.

## 2017-05-17 NOTE — Progress Notes (Signed)
D:Pt rates depression and anxiety as an 8 on 0-10 scale and with 10 being the most. Pt is having tremors and agitation. He c/o of a headache this morning and was offered aromatherapy that he refused.  A:Reported to NP/MD about pt's complaint. Offered encouragement and 15 minute checks. R:Pt denies si and hi. Safety maintained on the unit.

## 2017-05-17 NOTE — Progress Notes (Signed)
[facility] Group Notes:  (Nursing/MHT/Case Management/Adjunct)  Date:  05/17/2017  Time:  4:40 PM  Type of Therapy:  Nurse Education  Participation Level:  Active  Participation Quality:  Appropriate  Affect:  Flat  Cognitive:  Alert  Insight:  Appropriate  Engagement in Group:  Engaged  Modes of Intervention:  Activity, Education, Socialization and Support  Summary of Progress/Problems:The purpose of this group is to support patient's in mindfulness and relaxation.  Mosie Lukes 05/17/2017, 4:40 PM

## 2017-05-17 NOTE — Progress Notes (Signed)
[facility] MD Progress Note  05/17/2017 1:04 PM Barry Moore  MRN:  [id number]   Subjective:  Patient states he is feeling better today. He denies any SI/HI/AVH and contracts for safety. He denies any withdrawal symptoms today. He reports some disrupted sleep, but still feels rested. He still rates his depression at 8/10 and his anxiety at 8/10. He reports that his mood has been improving though.    Objective: Patient's chart and findings reviewed and discussed with treatment team. Patient presents in his room and is pleasant and cooperative. He has been attending group and participating. Due to him reporting stable depression, but at an 8 will increase the Celexa 20 mg Daily.     Principal Problem: Severe recurrent major depression without psychotic features ([place]) Diagnosis:   Patient Active Problem List   Diagnosis Date Noted  . Severe recurrent major depression without psychotic features ([place]) [F33.2] 05/15/2017  . Anemia [D64.9] 03/18/2017  . Acute blood loss anemia [D62] 02/21/2017  . UGI bleed [K92.2] 02/08/2017  . Alcohol withdrawal ([place]) [F10.239] 02/08/2017  . Cocaine abuse ([place]) [F14.10] 11/18/2016  . GI bleed [K92.2] 11/01/2016  . Alcohol abuse [F10.10]   . Major depressive disorder, recurrent severe without psychotic features ([place]) [F33.2] 07/06/2016  . Alcohol-induced mood disorder ([place]) [F10.94] 06/15/2016  . Seizure ([place]) [R56.9] 05/26/2016  . Tobacco abuse [Z72.0] 05/26/2016  . Homeless [Z59.0] 05/26/2016  . Essential hypertension [I10] 05/26/2016  . Alcohol dependence with alcohol-induced mood disorder ([place]) [F10.24] 02/14/2016  . Severe alcohol withdrawal without perceptual disturbances without complication ([place]) [Y69.485] 02/14/2016  . Alcoholic hepatitis without ascites [K70.10] 02/14/2016  . Thrombocytopenia ([place]) [D69.6] 02/14/2016   Total Time spent with patient: 30 minutes  Past Psychiatric History: See H&P  Past Medical History:  Past Medical History:   Diagnosis Date  . Alcoholic hepatitis   . Depression   . ETOH abuse   . Hypertension   . Pancreatitis   . Seizures ([place])     Past Surgical History:  Procedure Laterality Date  . ESOPHAGOGASTRODUODENOSCOPY (EGD) WITH PROPOFOL N/A 11/02/2016   Procedure: ESOPHAGOGASTRODUODENOSCOPY (EGD) WITH PROPOFOL;  Surgeon: Carol Ada, MD;  Location: [facility] ENDOSCOPY;  Service: Endoscopy;  Laterality: N/A;  . HERNIA REPAIR    . LEG SURGERY     Family History:  Family History  Problem Relation Age of Onset  . Diabetes Mother   . Alcoholism Mother   . Emphysema Father   . Lung cancer Father   . Alcoholism Father    Family Psychiatric  History: See H&P Social History:  Social History   Substance and Sexual Activity  Alcohol Use Yes   Comment: 4-5 40oz beers daily     Social History   Substance and Sexual Activity  Drug Use Yes  . Frequency: 3.0 times per week  . Types: Cocaine   Comment: last use today 2 weeks ago (04-09-17)     Social History   Socioeconomic History  . Marital status: Divorced    Spouse name: None  . Number of children: None  . Years of education: None  . Highest education level: None  Social Needs  . Financial resource strain: None  . Food insecurity - worry: None  . Food insecurity - inability: None  . Transportation needs - medical: None  . Transportation needs - non-medical: None  Occupational History  . None  Tobacco Use  . Smoking status: Current Every Day Smoker    Packs/day: 1.00    Types: Cigarettes  . Smokeless  tobacco: Never Used  Substance and Sexual Activity  . Alcohol use: Yes    Comment: 4-5 40oz beers daily  . Drug use: Yes    Frequency: 3.0 times per week    Types: Cocaine    Comment: last use today 2 weeks ago (04-09-17)   . Sexual activity: Not Currently  Other Topics Concern  . None  Social History Narrative  . None   Additional Social History:    Pain Medications: None Prescriptions: None Over the Counter: Tylenol when  needed. History of alcohol / drug use?: Yes Longest period of sobriety (when/how long): Four years when in prison Negative Consequences of Use: Personal relationships, Museum/gallery curator, Work / School Withdrawal Symptoms: Nausea / Vomiting, Tremors, Patient aware of relationship between substance abuse and physical/medical complications, Seizures, Sweats Onset of Seizures: ETOH withdrawal Date of most recent seizure: One week ago Name of Substance 1: ETOH 1 - Age of First Use: Teens 1 - Amount (size/oz): 1/5 liquor and four or five forties 1 - Frequency: Daily 1 - Duration: Ongoing  1 - Last Use / Amount: 05/14/17, One fifth of liquor and four 40-ounce beers                  Sleep: Fair  Appetite:  Good  Current Medications: Current Facility-Administered Medications  Medication Dose Route Frequency Provider Last Rate Last Dose  . alum & mag hydroxide-simeth (MAALOX/MYLANTA) 200-200-20 MG/5ML suspension 30 mL  30 mL Oral Q4H PRN Rozetta Nunnery, NP      . Derrill Memo ON 05/18/2017] citalopram (CELEXA) tablet 20 mg  20 mg Oral Daily Money, Travis B, FNP      . gabapentin (NEURONTIN) capsule 300 mg  300 mg Oral TID Money, Lowry Ram, FNP   300 mg at 05/17/17 1252  . hydrOXYzine (ATARAX/VISTARIL) tablet 25 mg  25 mg Oral Q6H PRN Lindon Romp A, NP   25 mg at 05/16/17 2225  . lisinopril (PRINIVIL,ZESTRIL) tablet 10 mg  10 mg Oral Daily Money, Travis B, FNP      . loperamide (IMODIUM) capsule 2-4 mg  2-4 mg Oral PRN Lindon Romp A, NP      . LORazepam (ATIVAN) tablet 1 mg  1 mg Oral Q6H PRN Lindon Romp A, NP      . LORazepam (ATIVAN) tablet 1 mg  1 mg Oral BID Rozetta Nunnery, NP       Followed by  . [START ON 05/19/2017] LORazepam (ATIVAN) tablet 1 mg  1 mg Oral Daily Lindon Romp A, NP      . magnesium hydroxide (MILK OF MAGNESIA) suspension 30 mL  30 mL Oral Daily PRN Lindon Romp A, NP      . multivitamin with minerals tablet 1 tablet  1 tablet Oral Daily Lindon Romp A, NP   1 tablet at 05/17/17  0831  . ondansetron (ZOFRAN-ODT) disintegrating tablet 4 mg  4 mg Oral Q6H PRN Lindon Romp A, NP      . thiamine (VITAMIN B-1) tablet 100 mg  100 mg Oral Daily Lindon Romp A, NP   100 mg at 05/17/17 9702  . traZODone (DESYREL) tablet 100 mg  100 mg Oral QHS PRN Money, Lowry Ram, FNP   100 mg at 05/16/17 2225    Lab Results:  No results found for this or any previous visit (from the past 48 hour(s)).  Blood Alcohol level:  Lab Results  Component Value Date   ETH 398 (HH) 05/14/2017   ETH 363 (  HH) 91/47/8295    Metabolic Disorder Labs: Lab Results  Component Value Date   HGBA1C 5.2 03/07/2016   MPG 103 03/07/2016   Lab Results  Component Value Date   PROLACTIN 23.2 (H) 03/07/2016   Lab Results  Component Value Date   CHOL 215 (H) 03/07/2016   TRIG 82 03/07/2016   HDL 105 03/07/2016   CHOLHDL 2.0 03/07/2016   VLDL 16 03/07/2016   LDLCALC 94 03/07/2016    Physical Findings: AIMS: Facial and Oral Movements Muscles of Facial Expression: None, normal Lips and Perioral Area: None, normal Jaw: None, normal Tongue: None, normal,Extremity Movements Upper (arms, wrists, hands, fingers): None, normal Lower (legs, knees, ankles, toes): None, normal, Trunk Movements Neck, shoulders, hips: None, normal, Overall Severity Severity of abnormal movements (highest score from questions above): None, normal Incapacitation due to abnormal movements: None, normal Patient's awareness of abnormal movements (rate only patient's report): No Awareness, Dental Status Current problems with teeth and/or dentures?: No Does patient usually wear dentures?: No  CIWA:  CIWA-Ar Total: 0 COWS:  COWS Total Score: 8  Musculoskeletal: Strength & Muscle Tone: within normal limits Gait & Station: normal Patient leans: N/A  Psychiatric Specialty Exam: Physical Exam  Nursing note and vitals reviewed. Constitutional: He is oriented to person, place, and time. He appears well-developed and  well-nourished.  Cardiovascular: Normal rate.  Respiratory: Effort normal.  Musculoskeletal: Normal range of motion.  Neurological: He is alert and oriented to person, place, and time.  Skin: Skin is warm.    Review of Systems  Constitutional: Negative.   HENT: Negative.   Eyes: Negative.   Respiratory: Negative.   Cardiovascular: Negative.   Gastrointestinal: Negative.   Genitourinary: Negative.   Musculoskeletal: Negative.   Skin: Negative.   Neurological: Negative.   Endo/Heme/Allergies: Negative.   Psychiatric/Behavioral: Positive for depression, substance abuse and suicidal ideas. Negative for hallucinations. The patient is nervous/anxious.     Blood pressure (!) 148/81, pulse 89, temperature 98.3 F (36.8 C), temperature source Oral, resp. rate 18, height 5\' 8"  (1.727 m), weight 81.6 kg (180 lb), SpO2 98 %.Body mass index is 27.37 kg/m.  General Appearance: Casual  Eye Contact:  Good  Speech:  Clear and Coherent and Normal Rate  Volume:  Normal  Mood:  Anxious and Depressed  Affect:  Congruent  Thought Process:  Goal Directed and Descriptions of Associations: Intact  Orientation:  Full (Time, Place, and Person)  Thought Content:  WDL  Suicidal Thoughts:  No   Homicidal Thoughts:  No  Memory:  Immediate;   Good Recent;   Good Remote;   Good  Judgement:  Fair  Insight:  Good  Psychomotor Activity:  Normal  Concentration:  Concentration: Good and Attention Span: Good  Recall:  Good  Fund of Knowledge:  Good  Language:  Good  Akathisia:  No  Handed:  Right  AIMS (if indicated):     Assets:  Communication Skills Desire for Improvement Financial Resources/Insurance Social Support  ADL's:  Intact  Cognition:  WNL  Sleep:  Number of Hours: 6   Problems Addressed: MDD severe Alcohol dependence HTN  Treatment Plan Summary: Daily contact with patient to assess and evaluate symptoms and progress in treatment, Medication management and Plan is to:   -ContinueTrazodone 100 mg PO QHS PRN for insomnia -Increase Celexa 20 mg PO [place] for mood stability -Continue Ativan Detox Protocol -Start Lisinopril 10 mg PO Daily for HTN -Continue Gabapentin 300 mg PO TID for withdrawal symptoms -Continue Vistaril 25  mg PO Q6H PRN for anxiety -Encourage group therapy participation  Lewis Shock, FNP 05/17/2017, 1:04 PM

## 2017-05-17 NOTE — Plan of Care (Signed)
No self injurious behavior observed or expressed. 

## 2017-05-18 NOTE — Progress Notes (Signed)
[facility] MD Progress Note  05/18/2017 3:28 PM Barry Moore  MRN:  [id number]   Subjective:  I have to face living in a tent but since I been here things have gotten much better. I just dont have a shower to stay clean.    Objective: Patient's chart and findings reviewed and discussed with treatment team. He notes much improvement since his admission to the hospital " I feel pretty good actually. " He rates his depression 0/10 and anxiety 0/10 with 10 being the worse. Upon discharge he plans to remain compliant on his medications and go to [facility] for medication assistance. He is attending groups and denies any behavioral disturbances. He has learned that he is hyperfocused on the past and that it triggers his anger. Goal today is to prepare for discharge. He is eating and sleeping well with no disturbances. He is taking his medication well, with no side effects.  Principal Problem: Severe recurrent major depression without psychotic features ([place]) Diagnosis:   Patient Active Problem List   Diagnosis Date Noted  . Severe recurrent major depression without psychotic features ([place]) [F33.2] 05/15/2017  . Anemia [D64.9] 03/18/2017  . Acute blood loss anemia [D62] 02/21/2017  . UGI bleed [K92.2] 02/08/2017  . Alcohol withdrawal ([place]) [F10.239] 02/08/2017  . Cocaine abuse ([place]) [F14.10] 11/18/2016  . GI bleed [K92.2] 11/01/2016  . Alcohol abuse [F10.10]   . Major depressive disorder, recurrent severe without psychotic features ([place]) [F33.2] 07/06/2016  . Alcohol-induced mood disorder ([place]) [F10.94] 06/15/2016  . Seizure ([place]) [R56.9] 05/26/2016  . Tobacco abuse [Z72.0] 05/26/2016  . Homeless [Z59.0] 05/26/2016  . Essential hypertension [I10] 05/26/2016  . Alcohol dependence with alcohol-induced mood disorder ([place]) [F10.24] 02/14/2016  . Severe alcohol withdrawal without perceptual disturbances without complication ([place]) [V78.469] 02/14/2016  . Alcoholic hepatitis without ascites [K70.10] 02/14/2016   . Thrombocytopenia ([place]) [D69.6] 02/14/2016   Total Time spent with patient: 30 minutes  Past Psychiatric History: See H&P  Past Medical History:  Past Medical History:  Diagnosis Date  . Alcoholic hepatitis   . Depression   . ETOH abuse   . Hypertension   . Pancreatitis   . Seizures ([place])     Past Surgical History:  Procedure Laterality Date  . ESOPHAGOGASTRODUODENOSCOPY (EGD) WITH PROPOFOL N/A 11/02/2016   Procedure: ESOPHAGOGASTRODUODENOSCOPY (EGD) WITH PROPOFOL;  Surgeon: Carol Ada, MD;  Location: [facility] ENDOSCOPY;  Service: Endoscopy;  Laterality: N/A;  . HERNIA REPAIR    . LEG SURGERY     Family History:  Family History  Problem Relation Age of Onset  . Diabetes Mother   . Alcoholism Mother   . Emphysema Father   . Lung cancer Father   . Alcoholism Father    Family Psychiatric  History: See H&P Social History:  Social History   Substance and Sexual Activity  Alcohol Use Yes   Comment: 4-5 40oz beers daily     Social History   Substance and Sexual Activity  Drug Use Yes  . Frequency: 3.0 times per week  . Types: Cocaine   Comment: last use today 2 weeks ago (04-09-17)     Social History   Socioeconomic History  . Marital status: Divorced    Spouse name: None  . Number of children: None  . Years of education: None  . Highest education level: None  Social Needs  . Financial resource strain: None  . Food insecurity - worry: None  . Food insecurity - inability: None  . Transportation needs - medical: None  .  Transportation needs - non-medical: None  Occupational History  . None  Tobacco Use  . Smoking status: Current Every Day Smoker    Packs/day: 1.00    Types: Cigarettes  . Smokeless tobacco: Never Used  Substance and Sexual Activity  . Alcohol use: Yes    Comment: 4-5 40oz beers daily  . Drug use: Yes    Frequency: 3.0 times per week    Types: Cocaine    Comment: last use today 2 weeks ago (04-09-17)   . Sexual activity: Not Currently   Other Topics Concern  . None  Social History Narrative  . None   Additional Social History:    Pain Medications: None Prescriptions: None Over the Counter: Tylenol when needed. History of alcohol / drug use?: Yes Longest period of sobriety (when/how long): Four years when in prison Negative Consequences of Use: Personal relationships, Museum/gallery curator, Work / School Withdrawal Symptoms: Nausea / Vomiting, Tremors, Patient aware of relationship between substance abuse and physical/medical complications, Seizures, Sweats Onset of Seizures: ETOH withdrawal Date of most recent seizure: One week ago Name of Substance 1: ETOH 1 - Age of First Use: Teens 1 - Amount (size/oz): 1/5 liquor and four or five forties 1 - Frequency: Daily 1 - Duration: Ongoing  1 - Last Use / Amount: 05/14/17, One fifth of liquor and four 40-ounce beers                  Sleep: Fair  Appetite:  Good  Current Medications: Current Facility-Administered Medications  Medication Dose Route Frequency Provider Last Rate Last Dose  . alum & mag hydroxide-simeth (MAALOX/MYLANTA) 200-200-20 MG/5ML suspension 30 mL  30 mL Oral Q4H PRN Lindon Romp A, NP      . citalopram (CELEXA) tablet 20 mg  20 mg Oral Daily Money, Lowry Ram, FNP   20 mg at 05/18/17 2297  . gabapentin (NEURONTIN) capsule 300 mg  300 mg Oral TID Money, Lowry Ram, FNP   300 mg at 05/18/17 1211  . lisinopril (PRINIVIL,ZESTRIL) tablet 10 mg  10 mg Oral Daily Money, Lowry Ram, FNP   10 mg at 05/18/17 9892  . [START ON 05/19/2017] LORazepam (ATIVAN) tablet 1 mg  1 mg Oral Daily Lindon Romp A, NP      . magnesium hydroxide (MILK OF MAGNESIA) suspension 30 mL  30 mL Oral Daily PRN Lindon Romp A, NP      . multivitamin with minerals tablet 1 tablet  1 tablet Oral Daily Lindon Romp A, NP   1 tablet at 05/18/17 0913  . thiamine (VITAMIN B-1) tablet 100 mg  100 mg Oral Daily Lindon Romp A, NP   100 mg at 05/18/17 0913  . traZODone (DESYREL) tablet 100 mg  100  mg Oral QHS PRN Money, Lowry Ram, FNP   100 mg at 05/17/17 2149    Lab Results:  No results found for this or any previous visit (from the past 48 hour(s)).  Blood Alcohol level:  Lab Results  Component Value Date   ETH 398 (HH) 05/14/2017   ETH 363 (HH) 11/94/1740    Metabolic Disorder Labs: Lab Results  Component Value Date   HGBA1C 5.2 03/07/2016   MPG 103 03/07/2016   Lab Results  Component Value Date   PROLACTIN 23.2 (H) 03/07/2016   Lab Results  Component Value Date   CHOL 215 (H) 03/07/2016   TRIG 82 03/07/2016   HDL 105 03/07/2016   CHOLHDL 2.0 03/07/2016   VLDL 16 03/07/2016  [place] 94 03/07/2016    Physical Findings: AIMS: Facial and Oral Movements Muscles of Facial Expression: None, normal Lips and Perioral Area: None, normal Jaw: None, normal Tongue: None, normal,Extremity Movements Upper (arms, wrists, hands, fingers): None, normal Lower (legs, knees, ankles, toes): None, normal, Trunk Movements Neck, shoulders, hips: None, normal, Overall Severity Severity of abnormal movements (highest score from questions above): None, normal Incapacitation due to abnormal movements: None, normal Patient's awareness of abnormal movements (rate only patient's report): No Awareness, Dental Status Current problems with teeth and/or dentures?: No Does patient usually wear dentures?: No  CIWA:  CIWA-Ar Total: 1 COWS:  COWS Total Score: 8  Musculoskeletal: Strength & Muscle Tone: within normal limits Gait & Station: normal Patient leans: N/A  Psychiatric Specialty Exam: Physical Exam  Nursing note and vitals reviewed. Constitutional: He is oriented to person, place, and time. He appears well-developed and well-nourished.  Cardiovascular: Normal rate.  Respiratory: Effort normal.  Musculoskeletal: Normal range of motion.  Neurological: He is alert and oriented to person, place, and time.  Skin: Skin is warm.    Review of Systems  Constitutional: Negative.    HENT: Negative.   Eyes: Negative.   Respiratory: Negative.   Cardiovascular: Negative.   Gastrointestinal: Negative.   Genitourinary: Negative.   Musculoskeletal: Negative.   Skin: Negative.   Neurological: Negative.   Endo/Heme/Allergies: Negative.   Psychiatric/Behavioral: Positive for depression, substance abuse and suicidal ideas. Negative for hallucinations. The patient is nervous/anxious.     Blood pressure 104/63, pulse 93, temperature 97.7 F (36.5 C), temperature source Oral, resp. rate 18, height 5\' 8"  (1.727 m), weight 81.6 kg (180 lb), SpO2 98 %.Body mass index is 27.37 kg/m.  General Appearance: Casual  Eye Contact:  Good  Speech:  Clear and Coherent and Normal Rate  Volume:  Normal  Mood:  Anxious and Depressed  Affect:  Congruent  Thought Process:  Goal Directed and Descriptions of Associations: Intact  Orientation:  Full (Time, Place, and Person)  Thought Content:  WDL  Suicidal Thoughts:  No   Homicidal Thoughts:  No  Memory:  Immediate;   Good Recent;   Good Remote;   Good  Judgement:  Fair  Insight:  Good  Psychomotor Activity:  Normal  Concentration:  Concentration: Good and Attention Span: Good  Recall:  Good  Fund of Knowledge:  Good  Language:  Good  Akathisia:  No  Handed:  Right  AIMS (if indicated):     Assets:  Communication Skills Desire for Improvement Financial Resources/Insurance Social Support  ADL's:  Intact  Cognition:  WNL  Sleep:  Number of Hours: 6   Problems Addressed: MDD severe Alcohol dependence HTN  Treatment Plan Summary: Daily contact with patient to assess and evaluate symptoms and progress in treatment, Medication management and Plan is to:  -ContinueTrazodone 100 mg PO QHS PRN for insomnia -COntinue Celexa 20 mg PO [place] for mood stability -Continue Ativan Detox Protocol -Continue Lisinopril 10 mg PO Daily for HTN -Continue Gabapentin 300 mg PO TID for withdrawal symptoms -Continue Vistaril 25 mg PO Q6H PRN for  anxiety -Encourage group therapy participation  Nanci Pina, FNP 05/18/2017, 3:28 PM

## 2017-05-18 NOTE — Plan of Care (Signed)
Patient verbalizes understanding of information, education provided. 

## 2017-05-18 NOTE — Progress Notes (Signed)
D: Patient observed isolative to room until midday however has been visible since lunch. Attended afternoon SW group.  Patient forwards minimal information. Patient's affect flat, mood depressed, ambivalent. Per self inventory and discussions with writer, rates depression, hopelessness and anxiety all at a 6/10. Rates sleep as fair, appetite as fair, energy as low and concentration as poor.  States goal for today is to "stay here." Denies pain. Slightly tremulous and reports withdrawal related irritability.   A: Medicated per orders, no prns requested or required. Level III obs in place for safety. Emotional support offered and self inventory reviewed. Encouraged completion of Suicide Safety Plan and programming participation. Discussed POC with MD, SW.  Fall prevention plan in place and reviewed with patient as pt is a high fall risk due to hx of falls.   R: Patient verbalizes understanding of POC, falls prevention education.  Patient denies SI/HI/AVH and remains safe on level III obs. Will continue to monitor closely and make verbal contact frequently.

## 2017-05-18 NOTE — Progress Notes (Signed)
The patient is interacting well with peers. He was in day room palying cards with peers at the beginning of the shift. He denied SI/HI and denied Hallucinations. Patient denied withdrawal symptoms . He attended NA group and participated in the group. Patient seemed to be doing well. He denied SI/HI and denied hallucinations. Q 15 minute checks continues for safety.

## 2017-05-18 NOTE — [facility] Group Notes (Signed)
LCSW Group Therapy Note 05/18/2017 12:56 PM  Type of Therapy/Topic: Group Therapy: Feelings about Diagnosis  Participation Level: Active   Description of Group:  This group will allow patients to explore their thoughts and feelings about diagnoses they have received. Patients will be guided to explore their level of understanding and acceptance of these diagnoses. Facilitator will encourage patients to process their thoughts and feelings about the reactions of others to their diagnosis and will guide patients in identifying ways to discuss their diagnosis with significant others in their lives. This group will be process-oriented, with patients participating in exploration of their own experiences, giving and receiving support, and processing challenge from other group members.  Therapeutic Goals: 1. Patient will demonstrate understanding of diagnosis as evidenced by identifying two or more symptoms of the disorder 2. Patient will be able to express two feelings regarding the diagnosis 3. Patient will demonstrate their ability to communicate their needs through discussion and/or role play  Summary of Patient Progress:  Barry Moore was engaged throughout the group session. He participated and contributed to the group's discussion. Barry Moore reports that his mental health diagnosis doesn't effect him. He states that his main issue is drinking to numb past traumatic experiences. Barry Moore states that he plans to build a stronger support network when he discharges from the hospital. He also reports that he plans to look for work and to quit drinking alcohol.   Therapeutic Modalities:  Cognitive Behavioral Therapy Brief Therapy Feelings Identification    [place]iv[place] Clinical Social Worker

## 2017-05-18 NOTE — [facility] Group Notes (Signed)
[place] Group Notes:  (Nursing/MHT/Case Management/Adjunct)  Date:  05/18/2017  Time:  1630  Type of Therapy:  Nurse Education - Promoting Wellness Through Positive Self Talk  Participation Level:  Minimal  Participation Quality:  Attentive  Affect:  Flat  Cognitive:  Alert  Insight:  Limited  Engagement in Group:  Limited  Modes of Intervention:  Discussion, Education and Support  Summary of Progress/Problems: Patient attended group however did not participate in discussion. Patients were taught how to rephrase negative self talk to promote positive self esteem and wellness.  Loletta Specter [facility] 05/18/2017, 5:34 PM

## 2017-05-18 NOTE — Progress Notes (Signed)
Recreation Therapy Notes  Date: 05/18/17 Time: 0930 Location: 300 Hall Dayroom  Group Topic: Stress Management  Goal Area(s) Addresses:  Patient will verbalize importance of using healthy stress management.  Patient will identify positive emotions associated with healthy stress management.   Intervention: Stress Management  Activity :  Progressive Muscle Relaxation.  LRT introduced the stress management technique of progressive muscle relaxation.  LRT read a script to guide the patients through the process of tensing and relaxing each muscle group individually.    Education:  Stress Management, Discharge Planning.   Education Outcome: Acknowledges edcuation/In group clarification offered/Needs additional education  Clinical Observations/Feedback: Pt did not attend group.     Victorino Sparrow, LRT/CTRS         Victorino Sparrow A 05/18/2017 11:56 AM

## 2017-05-19 MED ORDER — LISINOPRIL 10 MG PO TABS: 10.0000 mg | ORAL_TABLET | Freq: Every day | ORAL | 0 refills | Status: DC

## 2017-05-19 MED ORDER — CITALOPRAM HYDROBROMIDE 20 MG PO TABS: 20.0000 mg | ORAL_TABLET | Freq: Every day | ORAL | 0 refills | Status: DC

## 2017-05-19 MED ORDER — GABAPENTIN 300 MG PO CAPS: 300.0000 mg | ORAL_CAPSULE | Freq: Three times a day (TID) | ORAL | 0 refills | Status: DC

## 2017-05-19 MED ORDER — TRAZODONE HCL 100 MG PO TABS: 100.0000 mg | ORAL_TABLET | Freq: Every evening | ORAL | 0 refills | Status: DC | PRN

## 2017-05-19 NOTE — Discharge Summary (Signed)
Physician Discharge Summary Note  Patient:  Barry Moore is an 52 y.o., male MRN:  [id number] DOB:  Jul 10, 1965 Patient phone:  [phone] (home)  Patient address:   [place] [address],  Total Time spent with patient: 20 minutes  Date of Admission:  05/15/2017 Date of Discharge: 05/19/17  Reason for Admission:  Worsening depression with SI  Principal Problem: Severe recurrent major depression without psychotic features [facility]) Discharge Diagnoses: Patient Active Problem List   Diagnosis Date Noted  . Severe recurrent major depression without psychotic features ([place]) [F33.2] 05/15/2017  . Anemia [D64.9] 03/18/2017  . Acute blood loss anemia [D62] 02/21/2017  . UGI bleed [K92.2] 02/08/2017  . Alcohol withdrawal ([place]) [F10.239] 02/08/2017  . Cocaine abuse ([place]) [F14.10] 11/18/2016  . GI bleed [K92.2] 11/01/2016  . Alcohol abuse [F10.10]   . Major depressive disorder, recurrent severe without psychotic features ([place]) [F33.2] 07/06/2016  . Alcohol-induced mood disorder ([place]) [F10.94] 06/15/2016  . Seizure ([place]) [R56.9] 05/26/2016  . Tobacco abuse [Z72.0] 05/26/2016  . Homeless [Z59.0] 05/26/2016  . Essential hypertension [I10] 05/26/2016  . Alcohol dependence with alcohol-induced mood disorder ([place]) [F10.24] 02/14/2016  . Severe alcohol withdrawal without perceptual disturbances without complication ([place]) [H70.263] 02/14/2016  . Alcoholic hepatitis without ascites [K70.10] 02/14/2016  . Thrombocytopenia ([place]) [D69.6] 02/14/2016    Past Psychiatric History: patient states he has a long history of depression, which he feels is exacerbated by, but independent of, alcohol abuse . Denies history of mania or bipolarity. Reports history of PTSD symptoms as above . History of suicide attempt by overdosing about 15 years ago. No history of self cutting. Reports isolated hallucinations, which he associates with alcohol .  He has been admitted to psychiatric unit in the past, most  recently 06/2016. He has also been at [facility] rehab in the past, which he states he completed successfully   Past Medical History:  Past Medical History:  Diagnosis Date  . Alcoholic hepatitis   . Depression   . ETOH abuse   . Hypertension   . Pancreatitis   . Seizures ([place])     Past Surgical History:  Procedure Laterality Date  . ESOPHAGOGASTRODUODENOSCOPY (EGD) WITH PROPOFOL N/A 11/02/2016   Procedure: ESOPHAGOGASTRODUODENOSCOPY (EGD) WITH PROPOFOL;  Surgeon: Carol Ada, MD;  Location: [facility] ENDOSCOPY;  Service: Endoscopy;  Laterality: N/A;  . HERNIA REPAIR    . LEG SURGERY     Family History:  Family History  Problem Relation Age of Onset  . Diabetes Mother   . Alcoholism Mother   . Emphysema Father   . Lung cancer Father   . Alcoholism Father    Family Psychiatric  History: states both biological parents were alcoholic. No history of suicides of family. States that his mother had history of depression   Social History:  Social History   Substance and Sexual Activity  Alcohol Use Yes   Comment: 4-5 40oz beers daily     Social History   Substance and Sexual Activity  Drug Use Yes  . Frequency: 3.0 times per week  . Types: Cocaine   Comment: last use today 2 weeks ago (04-09-17)     Social History   Socioeconomic History  . Marital status: Divorced    Spouse name: None  . Number of children: None  . Years of education: None  . Highest education level: None  Social Needs  . Financial resource strain: None  . Food insecurity - worry: None  . Food insecurity - inability: None  . Transportation  needs - medical: None  . Transportation needs - non-medical: None  Occupational History  . None  Tobacco Use  . Smoking status: Current Every Day Smoker    Packs/day: 1.00    Types: Cigarettes  . Smokeless tobacco: Never Used  Substance and Sexual Activity  . Alcohol use: Yes    Comment: 4-5 40oz beers daily  . Drug use: Yes    Frequency: 3.0 times per week     Types: Cocaine    Comment: last use today 2 weeks ago (04-09-17)   . Sexual activity: Not Currently  Other Topics Concern  . None  Social History Narrative  . None    Hospital Course:   05/15/17 [facility] MD Assessment: 52 year old male, presented to ED via [employer]. States a friend of his called 911. Patient reports history of depression which he feels has been worsening . He reports suicidal ideations, and had been thinking of shooting or stabbing self . Patient has a long history of alcohol dependence and has been drinking heavily, and daily, up to a fifth of liquor and 4-5 40 ounce beers. Admission BAL 398. Admission UDS negative. Endorses neuro-vegetative symptoms of depression as below. Denies psychotic symptoms.  Patient remained on the [facility] unit for 4 days and stabilized with sobriety, medication, and therapy. Patient completed detox protocol, started on Celexa and titrated to 20 mg Daily, Gabapentin 300 mg TID, Trazodone 100 mg QHS PRN, and Lisinopril 10 mg Daily for HTN. Patient showed improvement with improved mood, affect, sleep, appetite, and interaction. Patient was seen in the day room interacting with peers and staff appropriately. Patient has been seen attending groups and participating. He was offered various programs and residential to attend, but he refused them and only wanted outpatient treatment. Patient agrees to follow up at [facility]. Patient denies any SI/HI/AVH and contracts for safety. Patient is provided with prescriptions and samples for his medications upon discharge.    Physical Findings: AIMS: Facial and Oral Movements Muscles of Facial Expression: None, normal Lips and Perioral Area: None, normal Jaw: None, normal Tongue: None, normal,Extremity Movements Upper (arms, wrists, hands, fingers): None, normal Lower (legs, knees, ankles, toes): None, normal, Trunk Movements Neck, shoulders, hips: None, normal, Overall Severity Severity of abnormal movements (highest score from  questions above): None, normal Incapacitation due to abnormal movements: None, normal Patient's awareness of abnormal movements (rate only patient's report): No Awareness, Dental Status Current problems with teeth and/or dentures?: No Does patient usually wear dentures?: No  CIWA:  CIWA-Ar Total: 1 COWS:  COWS Total Score: 8  Musculoskeletal: Strength & Muscle Tone: within normal limits Gait & Station: normal Patient leans: N/A  Psychiatric Specialty Exam: Physical Exam  Nursing note and vitals reviewed. Constitutional: He is oriented to person, place, and time. He appears well-developed and well-nourished.  Cardiovascular: Normal rate.  Respiratory: Effort normal.  Musculoskeletal: Normal range of motion.  Neurological: He is alert and oriented to person, place, and time.  Skin: Skin is warm.    Review of Systems  Constitutional: Negative.   HENT: Negative.   Eyes: Negative.   Respiratory: Negative.   Cardiovascular: Negative.   Gastrointestinal: Negative.   Genitourinary: Negative.   Musculoskeletal: Negative.   Skin: Negative.   Neurological: Negative.   Endo/Heme/Allergies: Negative.   Psychiatric/Behavioral: Negative.     Blood pressure 112/71, pulse 90, temperature 98.2 F (36.8 C), temperature source Oral, resp. rate 16, height 5\' 8"  (1.727 m), weight 81.6 kg (180 lb), SpO2 98 %.Body mass  index is 27.37 kg/m.  General Appearance: Casual  Eye Contact:  Good  Speech:  Clear and Coherent and Normal Rate  Volume:  Normal  Mood:  Euthymic  Affect:  Appropriate  Thought Process:  Goal Directed and Descriptions of Associations: Intact  Orientation:  Full (Time, Place, and Person)  Thought Content:  WDL  Suicidal Thoughts:  No  Homicidal Thoughts:  No  Memory:  Immediate;   Good Recent;   Good Remote;   Good  Judgement:  Good  Insight:  Good  Psychomotor Activity:  Normal  Concentration:  Concentration: Good and Attention Span: Good  Recall:  Good  Fund of  Knowledge:  Good  Language:  Good  Akathisia:  No  Handed:  Right  AIMS (if indicated):     Assets:  Communication Skills Desire for Improvement Financial Resources/Insurance Social Support  ADL's:  Intact  Cognition:  WNL  Sleep:  Number of Hours: 5.75     Have you used any form of tobacco in the last 30 days? (Cigarettes, Smokeless Tobacco, Cigars, and/or Pipes): Yes  Has this patient used any form of tobacco in the last 30 days? (Cigarettes, Smokeless Tobacco, Cigars, and/or Pipes) Yes, Yes, A prescription for an FDA-approved tobacco cessation medication was offered at discharge and the patient refused  Blood Alcohol level:  Lab Results  Component Value Date   ETH 398 ([place]) 05/14/2017   ETH 363 ([place]) 86/75/4492    Metabolic Disorder Labs:  Lab Results  Component Value Date   HGBA1C 5.2 03/07/2016   MPG 103 03/07/2016   Lab Results  Component Value Date   PROLACTIN 23.2 (H) 03/07/2016   Lab Results  Component Value Date   CHOL 215 (H) 03/07/2016   TRIG 82 03/07/2016   HDL 105 03/07/2016   CHOLHDL 2.0 03/07/2016   VLDL 16 03/07/2016   LDLCALC 94 03/07/2016    See Psychiatric Specialty Exam and Suicide Risk Assessment completed by Attending Physician prior to discharge.  Discharge destination:  Home  Is patient on multiple antipsychotic therapies at discharge:  No   Has Patient had three or more failed trials of antipsychotic monotherapy by history:  No  Recommended Plan for Multiple Antipsychotic Therapies: NA   Allergies as of 05/19/2017      Reactions   Tomato Shortness Of Breath, Nausea And Vomiting   Aspirin Nausea And Vomiting      Medication List    STOP taking these medications   folic acid 1 MG tablet Commonly known as:  FOLVITE     TAKE these medications     Indication  citalopram 20 MG tablet Commonly known as:  CELEXA Take 1 tablet (20 mg total) by mouth daily. For mood control Start taking on:  05/20/2017  Indication:  mood  stability   gabapentin 300 MG capsule Commonly known as:  NEURONTIN Take 1 capsule (300 mg total) by mouth 3 (three) times daily. for withdrawal symptoms  Indication:  Alcohol Withdrawal Syndrome   lisinopril 10 MG tablet Commonly known as:  PRINIVIL,ZESTRIL Take 1 tablet (10 mg total) by mouth daily. Start taking on:  05/20/2017  Indication:  High Blood Pressure Disorder   traZODone 100 MG tablet Commonly known as:  DESYREL Take 1 tablet (100 mg total) by mouth at bedtime as needed for sleep.  Indication:  Trouble Sleeping      Follow-up Information    [facility] Follow up on 05/23/2017.   Specialty:  Behavioral Health Why:  Hospital follow-up on Monday,  3/4 at 8:15AM. Please bring: photo ID, social security card, and hospital discharge paperwork with you to this appt if you have them. Thank you.  Contact information: [address] [place] [address] [phone]           Follow-up recommendations:  Continue activity as tolerated. Continue diet as recommended by your PCP. Ensure to keep all appointments with outpatient providers.  Comments:  Patient is instructed prior to discharge to: Take all medications as prescribed by his/her mental healthcare provider. Report any adverse effects and or reactions from the medicines to his/her outpatient provider promptly. Patient has been instructed & cautioned: To not engage in alcohol and or illegal drug use while on prescription medicines. In the event of worsening symptoms, patient is instructed to call the crisis hotline, 911 and or go to the nearest ED for appropriate evaluation and treatment of symptoms. To follow-up with his/her primary care provider for your other medical issues, concerns and or health care needs.    Signed: [place], FNP 05/19/2017, 9:05 AM

## 2017-05-19 NOTE — Plan of Care (Signed)
Patient has not engaged in self harm. Denies thoughts, intent to do so.  Patient is med compliant.

## 2017-05-19 NOTE — Progress Notes (Signed)
  [facility] Adult Case Management Discharge Plan :  Will you be returning to the same living situation after discharge:  Yes,  pt plans to return to his tent. At discharge, do you have transportation home?: Yes,  bus pass provided Do you have the ability to pay for your medications: Yes,  mental health  Release of information consent forms completed and submitted to medical records by CSW.  Patient to Follow up at: Follow-up Information    [facility] Follow up on 05/23/2017.   Specialty:  Behavioral Health Why:  Hospital follow-up on Monday, 3/4 at 8:15AM. Please bring: photo ID, social security card, and hospital discharge paperwork with you to this appt if you have them. Thank you.  Contact information: [place] [address] [phone]           Next level of care provider has access to [place] and Suicide Prevention discussed: Yes,  SPE completed with pt; pt declined to consent to family contact. SPI pamphlet and Mobile Crisis information provided.   Have you used any form of tobacco in the last 30 days? (Cigarettes, Smokeless Tobacco, Cigars, and/or Pipes): Yes  Has patient been referred to the Quitline?: Patient refused referral  Patient has been referred for addiction treatment: Yes  [employer], LCSW 05/19/2017, 9:12 AM

## 2017-05-19 NOTE — Progress Notes (Signed)
Patient verbalizes readiness for discharge. Follow up plan explained, AVS, transition record and SRA given along with prescriptions. Sample meds provided. All belongings returned. Suicide Safety Plan completed, on chart and copy given to patient. Patient verbalizes understanding. Denies SI/HI and assures this Probation officer he will seek assistance should that change. Patient discharged ambulatory and in stable condition to with bus passes and directions to bus stop.

## 2017-05-19 NOTE — Progress Notes (Signed)
D: Patient observed up and visible in milieu however did not elect to go outside at rec time. Patient states he is leaving today and feels comfortable with plan. Patient's affect flat but brightens with interaction. Mood depressed but pleasant. Per self inventory and discussions with writer, rates depression at a 5/10, hopelessness at a 2/10 and anxiety at a 2/10. Rates sleep as good, appetite as fair, energy as normal and concentration as poor.  States goal for today is "I am getting out today." Denies pain, physical complaints.   A: Medicated per orders, no prns requested or required. Level III obs in place for safety. Emotional support offered and self inventory reviewed. Encouraged completion of Suicide Safety Plan and programming participation. Discussed POC with MD, SW.  Fall prevention plan in place and reviewed with patient as pt is a high fall risk due to hx of falls PTA.   R: Patient verbalizes understanding of POC, falls prevention education. Patient denies SI/HI/AVH and remains safe on level III obs. Will continue to monitor closely and make verbal contact frequently.

## 2017-05-19 NOTE — Progress Notes (Signed)
Pt attended morning Orientation/Daily Goals Group and stated that for the day he would like to work towards discharge within the next few days and learning to have patience with people in the real world. Pt is aware of the rules of the unit and has agreed to abide by them.

## 2017-05-19 NOTE — BHH Suicide Risk Assessment (Signed)
[facility] Discharge Suicide Risk Assessment   Principal Problem: Severe recurrent major depression without psychotic features [facility]) Discharge Diagnoses: Substance Induced Mood Disorder Patient Active Problem List   Diagnosis Date Noted  . Severe recurrent major depression without psychotic features ([place]) [F33.2] 05/15/2017  . Anemia [D64.9] 03/18/2017  . Acute blood loss anemia [D62] 02/21/2017  . UGI bleed [K92.2] 02/08/2017  . Alcohol withdrawal ([place]) [F10.239] 02/08/2017  . Cocaine abuse ([place]) [F14.10] 11/18/2016  . GI bleed [K92.2] 11/01/2016  . Alcohol abuse [F10.10]   . Major depressive disorder, recurrent severe without psychotic features ([place]) [F33.2] 07/06/2016  . Alcohol-induced mood disorder ([place]) [F10.94] 06/15/2016  . Seizure ([place]) [R56.9] 05/26/2016  . Tobacco abuse [Z72.0] 05/26/2016  . Homeless [Z59.0] 05/26/2016  . Essential hypertension [I10] 05/26/2016  . Alcohol dependence with alcohol-induced mood disorder ([place]) [F10.24] 02/14/2016  . Severe alcohol withdrawal without perceptual disturbances without complication ([place]) [X51.700] 02/14/2016  . Alcoholic hepatitis without ascites [K70.10] 02/14/2016  . Thrombocytopenia ([place]) [D69.6] 02/14/2016    Total Time spent with patient: 45 minutes  Musculoskeletal: Strength & Muscle Tone: within normal limits Gait & Station: normal Patient leans: N/A  Psychiatric Specialty Exam: Review of Systems  Constitutional: Negative.   HENT: Negative.   Eyes: Negative.   Respiratory: Negative.   Cardiovascular: Negative.   Gastrointestinal: Negative.   Genitourinary: Negative.   Musculoskeletal: Negative.   Skin: Negative.   Neurological: Negative.   Endo/Heme/Allergies: Negative.   Psychiatric/Behavioral: Negative for depression, hallucinations, memory loss, substance abuse and suicidal ideas. The patient is not nervous/anxious and does not have insomnia.     Blood pressure 112/71, pulse 90, temperature 98.2 F (36.8 C),  temperature source Oral, resp. rate 16, height 5' 8" (1.727 m), weight 81.6 kg (180 lb), SpO2 98 %.Body mass index is 27.37 kg/m.  General Appearance:  Limited grooming, pleasant and engaged well. Not in any distress. No tremors, not sweaty.   Eye Contact::  Good  Speech:  Spontaneous, normal prosody. Normal tone and rate.   Volume:  Normal  Mood:  Euthymic  Affect:  Appropriate and Full Range  Thought Process:  Goal Directed  Orientation:  Full (Time, Place, and Person)  Thought Content:  No delusional theme. No preoccupation with violent thoughts. No negative ruminations. No obsession.  No hallucination in any modality.   Suicidal Thoughts:  No  Homicidal Thoughts:  No  Memory:  Immediate;   Good Recent;   Good Remote;   Good  Judgement:  Good  Insight:  Fair  Psychomotor Activity:  Normal  Concentration:  Good  Recall:  Good  Fund of Knowledge:Good  Language: Good  Akathisia:  Negative  Handed:    AIMS (if indicated):     Assets:  Communication Skills Desire for Improvement Resilience  Sleep:  Number of Hours: 5.75  Cognition: WNL  ADL's:  Fair   Clinical Assessment::    52 y.o Caucasian male, unemployed, homeless, has chronically lived in a tent. Background history of AUD and mood disorder. Multiple presentation to the ER in the past couple of weeks. Usually presents intoxicated. This presentation was via the police. BAL 398 mg/dl. While intoxicated, he expressed thoughts of slitting own vessel. He expressed thoughts of shooting self. He reported thoughts of homicide. Patient has detoxed well from alcohol. He has been started on medications for mood disorder.  Chart reviewed today. Patient discussed at team today. Team members feels that patient is back to his baseline level of function. Team agrees with plan to  discharge patient today.  Nursing staff reports that patient has been appropriate on the unit. Patient has been interacting well with peers. No behavioral issues.  Patient has not voiced any suicidal thoughts. Patient has not been observed to be internally stimulated. Patient has been adherent with treatment recommendations. Patient has been tolerating their medication well.   I met with him today, tells me that he was intoxicated and said things he cannot remember. Says the police showed up and offered to give him a ride to the hospital. Patient says he is grateful for the opportunity to come off alcohol. Says he plans to stay sober by attending AA and follow up appointment. Says he had been drinking on a daily basis. He was waking at night just to drink. He is aware of the effects it has had on his liver. Patient says it's time to turn it around. Says he had been on anti-craving agent in the past but it did not help. Refused offer to try Naltrexone. Patient dismissed homicidal thoughts he made at presentation. Tells me that he was drunk then. He denies any lingering suicidal thoughts. No thoughts of violence. Reports that he is in good spirits. Not feeling depressed. Reports normal energy and interest. Has been maintaining normal biological functions. He is able to think clearly. He is able to focus on task. His thoughts are not crowded or racing. No evidence of mania. No hallucination in any modality. He is not making any delusional statement. No passivity of will/thought. He is fully in touch with reality. No access to weapons. No new stressor.    Demographic Factors:  Male, Divorced or widowed, Caucasian, Low socioeconomic status and Living alone  Loss Factors: Financial problems/change in socioeconomic status  Historical Factors: Impulsivity  Risk Reduction Factors:   Positive social support, Positive therapeutic relationship and Positive coping skills or problem solving skills  Continued Clinical Symptoms:  As above   Cognitive Features That Contribute To Risk:  None    Suicide Risk:  Minimal: No identifiable suicidal ideation.  Patient is not  having any thoughts of suicide at this time. Modifiable risk factors targeted during this admission includes depression  and substance use. Demographical and historical risk factors cannot be modified. Patient is now engaging well. Patient is reliable and is future oriented. We have buffered patient's support structures. At this point, patient is at low risk of suicide. Patient is aware of the effects of psychoactive substances on decision making process. Patient has been provided with emergency contacts. Patient acknowledges to use resources provided if unforseen circumstances changes their current risk stratification.    Follow-up Information    [facility] Follow up on 05/23/2017.   Specialty:  Behavioral Health Why:  Hospital follow-up on Monday, 3/4 at 8:15AM. Please bring: photo ID, social security card, and hospital discharge paperwork with you to this appt if you have them. Thank you.  Contact information: [place] [address] [phone]           Plan Of Care/Follow-up recommendations:  1. Continue current psychotropic medications 2. Mental health and addiction follow up as arranged.  3. Provided limited quantity of prescriptions   [place], MD 05/19/2017, 10:21 AM

## 2017-05-21 ENCOUNTER — Encounter (HOSPITAL_COMMUNITY): Payer: Self-pay | Admitting: Emergency Medicine

## 2017-05-21 ENCOUNTER — Other Ambulatory Visit: Payer: Self-pay

## 2017-05-21 ENCOUNTER — Emergency Department (HOSPITAL_COMMUNITY)
Admission: EM | Admit: 2017-05-21 | Discharge: 2017-05-22 | Disposition: A | Discharge status: Home / Self Care | Payer: Self-pay | Attending: Emergency Medicine | Admitting: Emergency Medicine

## 2017-05-21 DIAGNOSIS — Z79899 Other long term (current) drug therapy: Secondary | ICD-10-CM | POA: Insufficient documentation

## 2017-05-21 DIAGNOSIS — I10 Essential (primary) hypertension: Secondary | ICD-10-CM | POA: Insufficient documentation

## 2017-05-21 DIAGNOSIS — F10920 Alcohol use, unspecified with intoxication, uncomplicated: Secondary | ICD-10-CM | POA: Insufficient documentation

## 2017-05-21 DIAGNOSIS — R740 Nonspecific elevation of levels of transaminase and lactic acid dehydrogenase [LDH]: Secondary | ICD-10-CM | POA: Insufficient documentation

## 2017-05-21 DIAGNOSIS — F332 Major depressive disorder, recurrent severe without psychotic features: Secondary | ICD-10-CM | POA: Insufficient documentation

## 2017-05-21 DIAGNOSIS — F17210 Nicotine dependence, cigarettes, uncomplicated: Secondary | ICD-10-CM | POA: Insufficient documentation

## 2017-05-21 DIAGNOSIS — F1994 Other psychoactive substance use, unspecified with psychoactive substance-induced mood disorder: Secondary | ICD-10-CM | POA: Insufficient documentation

## 2017-05-21 DIAGNOSIS — R7401 Elevation of levels of liver transaminase levels: Secondary | ICD-10-CM

## 2017-05-21 DIAGNOSIS — F1010 Alcohol abuse, uncomplicated: Secondary | ICD-10-CM

## 2017-05-21 LAB — CBC
HEMATOCRIT: 29.8 % — AB (ref 39.0–52.0)
Hemoglobin: 8.9 g/dL — ABNORMAL LOW (ref 13.0–17.0)
MCH: 21.7 pg — AB (ref 26.0–34.0)
MCHC: 29.9 g/dL — ABNORMAL LOW (ref 30.0–36.0)
MCV: 72.7 fL — AB (ref 78.0–100.0)
PLATELETS: 208 10*3/uL (ref 150–400)
RBC: 4.1 MIL/uL — ABNORMAL LOW (ref 4.22–5.81)
RDW: 19.4 % — AB (ref 11.5–15.5)
WBC: 4.7 10*3/uL (ref 4.0–10.5)

## 2017-05-21 LAB — COMPREHENSIVE METABOLIC PANEL
ALK PHOS: 96 U/L (ref 38–126)
ALT: 166 U/L — ABNORMAL HIGH (ref 17–63)
AST: 159 U/L — AB (ref 15–41)
Albumin: 4 g/dL (ref 3.5–5.0)
Anion gap: 9 (ref 5–15)
BUN: 7 mg/dL (ref 6–20)
CALCIUM: 8.8 mg/dL — AB (ref 8.9–10.3)
CO2: 25 mmol/L (ref 22–32)
CREATININE: 0.82 mg/dL (ref 0.61–1.24)
Chloride: 107 mmol/L (ref 101–111)
GFR calc Af Amer: >60 mL/min (ref >60)
Glucose, Bld: 119 mg/dL — ABNORMAL HIGH (ref 65–99)
POTASSIUM: 3.7 mmol/L (ref 3.5–5.1)
Sodium: 141 mmol/L (ref 135–145)
TOTAL PROTEIN: 7.7 g/dL (ref 6.5–8.1)
Total Bilirubin: 0.2 mg/dL — ABNORMAL LOW (ref 0.3–1.2)

## 2017-05-21 LAB — SALICYLATE LEVEL: Salicylate Lvl: <7 mg/dL (ref 2.8–30.0)

## 2017-05-21 LAB — ACETAMINOPHEN LEVEL: Acetaminophen (Tylenol), Serum: <10 ug/mL — ABNORMAL LOW (ref 10–30)

## 2017-05-21 LAB — ETHANOL: ALCOHOL ETHYL (B): 370 mg/dL — AB (ref <10)

## 2017-05-21 LAB — I-STAT TROPONIN, ED: TROPONIN I, POC: 0 ng/mL (ref 0.00–0.08)

## 2017-05-21 LAB — RAPID URINE DRUG SCREEN, HOSP PERFORMED
Amphetamines: NOT DETECTED
BARBITURATES: NOT DETECTED
Benzodiazepines: NOT DETECTED
Cocaine: NOT DETECTED
Opiates: NOT DETECTED
Tetrahydrocannabinol: NOT DETECTED

## 2017-05-21 MED ORDER — LORAZEPAM 2 MG/ML IJ SOLN: 0.0000 mg | Freq: Two times a day (BID) | INTRAMUSCULAR | Status: DC

## 2017-05-21 MED ORDER — VITAMIN B-1 100 MG PO TABS: 100.0000 mg | ORAL_TABLET | Freq: Every day | ORAL | Status: DC

## 2017-05-21 MED ORDER — LORAZEPAM 1 MG PO TABS
0.0000 mg | ORAL_TABLET | Freq: Four times a day (QID) | ORAL | Status: DC
  Administered 2017-05-21: 1 mg via ORAL
  Filled 2017-05-21: qty 1

## 2017-05-21 MED ORDER — LORAZEPAM 1 MG PO TABS: 0.0000 mg | ORAL_TABLET | Freq: Two times a day (BID) | ORAL | Status: DC

## 2017-05-21 MED ORDER — LORAZEPAM 2 MG/ML IJ SOLN: 0.0000 mg | Freq: Four times a day (QID) | INTRAMUSCULAR | Status: DC

## 2017-05-21 MED ORDER — THIAMINE HCL 100 MG/ML IJ SOLN: 100.0000 mg | Freq: Every day | INTRAMUSCULAR | Status: DC

## 2017-05-21 NOTE — ED Provider Notes (Signed)
[facility] DEPT Provider Note   CSN: [id number] Arrival date & time: 05/21/17  1934     History   Chief Complaint Chief Complaint  Patient presents with  . Alcohol Intoxication  . Suicidal    HPI Barry Moore is a 52 y.o. male who presents with SI. PMH significant for homelessness, frequent ED visits, substance induced depression/anxiety, HTN, polysubstance abuse. The patient is currently intoxicated. He states that he was doing better but then he got upset at someone and started drinking again. He reports passive SI stating that "what's the point". He was last admitted at [facility] from 2/23-2/28. He did not have acute withdrawal symptoms during last admission. He reports vague chest pain which he states "they told me it's from stress", a headache, and back pain. He denies recent falls. He is able to walk.  LEVEL 5 caveat due to intoxication  HPI  Past Medical History:  Diagnosis Date  . Alcoholic hepatitis   . Depression   . ETOH abuse   . Hypertension   . Pancreatitis   . Seizures [facility])     Patient Active Problem List   Diagnosis Date Noted  . Severe recurrent major depression without psychotic features ([place]) 05/15/2017  . Anemia 03/18/2017  . Acute blood loss anemia 02/21/2017  . UGI bleed 02/08/2017  . Alcohol withdrawal ([place]) 02/08/2017  . Cocaine abuse ([place]) 11/18/2016  . GI bleed 11/01/2016  . Alcohol abuse   . Major depressive disorder, recurrent severe without psychotic features ([place]) 07/06/2016  . Alcohol-induced mood disorder ([place]) 06/15/2016  . Seizure ([place]) 05/26/2016  . Tobacco abuse 05/26/2016  . Homeless 05/26/2016  . Essential hypertension 05/26/2016  . Alcohol dependence with alcohol-induced mood disorder ([place]) 02/14/2016  . Severe alcohol withdrawal without perceptual disturbances without complication ([place]) 09/32/6712  . Alcoholic hepatitis without ascites 02/14/2016  . Thrombocytopenia ([place]) 02/14/2016    Past  Surgical History:  Procedure Laterality Date  . ESOPHAGOGASTRODUODENOSCOPY (EGD) WITH PROPOFOL N/A 11/02/2016   Procedure: ESOPHAGOGASTRODUODENOSCOPY (EGD) WITH PROPOFOL;  Surgeon: Carol Ada, MD;  Location: [facility] END[facility];  Service: Endoscopy;  Laterality: N/A;  . HERNIA REPAIR    . LEG SURGERY         Home Medications    Prior to Admission medications   Medication Sig Start Date End Date Taking? Authorizing Provider  citalopram (CELEXA) 20 MG tablet Take 1 tablet (20 mg total) by mouth daily. For mood control 05/20/17   Money, Lowry Ram, FNP  gabapentin (NEURONTIN) 300 MG capsule Take 1 capsule (300 mg total) by mouth 3 (three) times daily. for withdrawal symptoms 05/19/17   Money, Lowry Ram, FNP  lisinopril (PRINIVIL,ZESTRIL) 10 MG tablet Take 1 tablet (10 mg total) by mouth daily. 05/20/17   Money, Lowry Ram, FNP  traZODone (DESYREL) 100 MG tablet Take 1 tablet (100 mg total) by mouth at bedtime as needed for sleep. 05/19/17   Money, Lowry Ram, FNP    Family History Family History  Problem Relation Age of Onset  . Diabetes Mother   . Alcoholism Mother   . Emphysema Father   . Lung cancer Father   . Alcoholism Father     Social History Social History   Tobacco Use  . Smoking status: Current Every Day Smoker    Packs/day: 1.00    Types: Cigarettes  . Smokeless tobacco: Never Used  Substance Use Topics  . Alcohol use: Yes    Comment: 4-5 40oz beers daily  . Drug use: Yes  Frequency: 3.0 times Barry week    Types: Cocaine    Comment: last use today 2 weeks ago (04-09-17)      Allergies   Tomato and Aspirin   Review of Systems Review of Systems  Respiratory: Negative for shortness of breath.   Cardiovascular: Positive for chest pain.  Gastrointestinal: Negative for abdominal pain, nausea and vomiting.  Musculoskeletal: Positive for back pain. Negative for gait problem.  Neurological: Positive for headaches.  Psychiatric/Behavioral: Positive for dysphoric mood and  suicidal ideas. Negative for self-injury.  All other systems reviewed and are negative.    Physical Exam Updated Vital Signs BP 122/76   Pulse 70   Temp 98.1 F (36.7 C) (Oral)   Resp 18   Ht 5\' 7"  (1.702 m)   Wt 86.2 kg (190 lb)   SpO2 100%   BMI 29.76 kg/m   Physical Exam  Constitutional: He is oriented to person, place, and time. He appears well-developed and well-nourished. No distress.  Clinically intoxicated, disheveled  HENT:  Head: Normocephalic and atraumatic.  Eyes: Conjunctivae are normal. Pupils are equal, round, and reactive to light. Right eye exhibits no discharge. Left eye exhibits no discharge. No scleral icterus.  Neck: Normal range of motion.  Cardiovascular: Normal rate and regular rhythm.  Pulmonary/Chest: Effort normal and breath sounds normal. No respiratory distress.  Abdominal: Soft. Bowel sounds are normal. He exhibits no distension. There is no tenderness.  Musculoskeletal:  Minimal low back tenderness  Ambulatory  Neurological: He is alert and oriented to person, place, and time.  Skin: Skin is warm and dry.  Psychiatric: He has a normal mood and affect. His behavior is normal.  Nursing note and vitals reviewed.    ED Treatments / Results  Labs (all labs ordered are listed, but only abnormal results are displayed) Labs Reviewed  COMPREHENSIVE METABOLIC PANEL - Abnormal; Notable for the following components:      Result Value   Glucose, Bld 119 (*)    Calcium 8.8 (*)    AST 159 (*)    ALT 166 (*)    Total Bilirubin 0.2 (*)    All other components within normal limits  ETHANOL - Abnormal; Notable for the following components:   Alcohol, Ethyl (B) 370 (*)    All other components within normal limits  ACETAMINOPHEN LEVEL - Abnormal; Notable for the following components:   Acetaminophen (Tylenol), Serum <10 (*)    All other components within normal limits  CBC - Abnormal; Notable for the following components:   RBC 4.10 (*)     Hemoglobin 8.9 (*)    HCT 29.8 (*)    MCV 72.7 (*)    MCH 21.7 (*)    MCHC 29.9 (*)    RDW 19.4 (*)    All other components within normal limits  SALICYLATE LEVEL  RAPID URINE DRUG SCREEN, HOSP PERFORMED  I-STAT TROPONIN, ED    EKG  EKG Interpretation  Date/Time:  Saturday May 21 2017 21:37:37 EST Ventricular Rate:  96 PR Interval:  154 QRS Duration: 94 QT Interval:  338 QTC Calculation: 427 R Axis:   91 Text Interpretation:  Normal sinus rhythm Rightward axis Borderline ECG Since last EKG, TWI in lead III Otherwise no significant change Confirmed by Duffy Bruce [phone]) on 05/21/2017 10:50:21 PM       Radiology No results found.  Procedures Procedures (including critical care time)  Medications Ordered in ED Medications  LORazepam (ATIVAN) injection 0-4 mg (not administered)  Or  LORazepam (ATIVAN) tablet 0-4 mg (not administered)  LORazepam (ATIVAN) injection 0-4 mg (not administered)    Or  LORazepam (ATIVAN) tablet 0-4 mg (not administered)  thiamine (VITAMIN B-1) tablet 100 mg (not administered)    Or  thiamine (B-1) injection 100 mg (not administered)     Initial Impression / Assessment and Plan / ED Course  I have reviewed the triage vital signs and the nursing notes.  Pertinent labs & imaging results that were available during my care of the patient were reviewed by me and considered in my medical decision making (see chart for details).  52 year old male presents acutely intoxicated with SI. Vitals are normal. He reports chest pain, headache, back pain. Exam is overall unremarkable. CBC is remarkable for anemia which is slightly worse than baseline. CMP is remarkable for elevated transaminases which is due to his ongoing alcohol abuse. He has known liver disease. EKG is NSR. Trop is 0. He is vague in describing his symptoms therefore likely non-cardiac. UDS is normal however ETOH is 370 which is around his baseline when he presents to the ED. He has  passive SI which usually resolves when he is sober. He was placed on CIWA protocol and TTS consult was placed.  TTS recommends monitoring and reassessment in the morning.  Final Clinical Impressions(s) / ED Diagnoses   Final diagnoses:  Alcohol abuse  Alcoholic intoxication without complication ([place])  Elevated transaminase level  Suicidal ideation    ED Discharge Orders    None       Barry Evangelist, PA-C 05/21/17 2314    Duffy Bruce, MD 05/22/17 [phone]

## 2017-05-21 NOTE — ED Notes (Signed)
TTS at bedside to assess pt.

## 2017-05-21 NOTE — ED Notes (Signed)
Bed: [facility] Expected date:  Expected time:  Means of arrival:  Comments: ems 
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
## 2017-05-21 NOTE — ED Notes (Signed)
ED Provider at bedside. 

## 2017-05-21 NOTE — ED Notes (Signed)
PT ambulated to conference room to have TTS consult done

## 2017-05-21 NOTE — ED Triage Notes (Signed)
PT presents by EMS for pt wanting detox from alcohol. EMS reports pt had drank vodka and beer prior to arrival. Pt reports having an episode and wants to die. Pt denies any plan of harm at this time. Pt states he hasn't drank enough tonight because he has not passed out.

## 2017-05-21 NOTE — ED Notes (Addendum)
Date and time results received: 05/21/17 10:18 PM  Test: ethanol Critical Value: 370  Name of Provider Notified: Informed RN Ilene  Orders Received? Or Actions Taken?: Informed RN Kallie Locks and MD Issacs

## 2017-05-22 NOTE — ED Notes (Signed)
Pt. Denied to be SI/HI. Alert and oriented x4. Stated " I got a lot of things to do today and im ready to go. im fine now"

## 2017-05-22 NOTE — ED Provider Notes (Signed)
6:05 AM Patient is up, awake, alert.  He is requesting discharge.  He denies any suicidal or homicidal thoughts.  When asked if the patient would kill himself if he left the emergency department he responds, "Why? I have a wonderful life to live".  Plan for discharge with primary care follow-up as needed.  Return precautions discussed and provided.  Patient discharged in stable condition with no unaddressed concerns.  Vitals:   05/21/17 2331 05/22/17 0441 05/22/17 0446 05/22/17 0603  BP: 117/84 123/76 123/75 (!) 153/92  Pulse: 87 88 88 92  Resp:  17  18  Temp:      TempSrc:      SpO2:  97%  96%  Weight:      Height:          Antonietta Breach, PA-C 05/22/17 0606    Carmin Muskrat, MD 05/22/17 [phone]

## 2017-05-23 ENCOUNTER — Other Ambulatory Visit: Payer: Self-pay

## 2017-05-23 ENCOUNTER — Emergency Department (HOSPITAL_COMMUNITY)
Admission: EM | Admit: 2017-05-23 | Discharge: 2017-05-23 | Disposition: A | Discharge status: Home / Self Care | Payer: Self-pay | Attending: Emergency Medicine | Admitting: Emergency Medicine

## 2017-05-23 ENCOUNTER — Encounter (HOSPITAL_COMMUNITY): Payer: Self-pay | Admitting: Emergency Medicine

## 2017-05-23 DIAGNOSIS — Z590 Homelessness: Secondary | ICD-10-CM | POA: Insufficient documentation

## 2017-05-23 DIAGNOSIS — F17210 Nicotine dependence, cigarettes, uncomplicated: Secondary | ICD-10-CM | POA: Insufficient documentation

## 2017-05-23 DIAGNOSIS — F10229 Alcohol dependence with intoxication, unspecified: Secondary | ICD-10-CM | POA: Insufficient documentation

## 2017-05-23 DIAGNOSIS — R0602 Shortness of breath: Secondary | ICD-10-CM | POA: Insufficient documentation

## 2017-05-23 DIAGNOSIS — Z79899 Other long term (current) drug therapy: Secondary | ICD-10-CM | POA: Insufficient documentation

## 2017-05-23 DIAGNOSIS — F10920 Alcohol use, unspecified with intoxication, uncomplicated: Secondary | ICD-10-CM

## 2017-05-23 NOTE — ED Provider Notes (Signed)
[facility] DEPT Provider Note   CSN: [id number] Arrival date & time: 05/23/17  0240     History   Chief Complaint Chief Complaint  Patient presents with  . Cold Extremity    HPI Barry Moore is a 52 y.o. male.  Patient comes in stating that he is cold and his clothes are wet.  He reports that this is making him feel bad.  He does not have any other specific complaints.      Past Medical History:  Diagnosis Date  . Alcoholic hepatitis   . Depression   . ETOH abuse   . Hypertension   . Pancreatitis   . Seizures [facility]     Patient Active Problem List   Diagnosis Date Noted  . Severe recurrent major depression without psychotic features ([place]) 05/15/2017  . Anemia 03/18/2017  . Acute blood loss anemia 02/21/2017  . UGI bleed 02/08/2017  . Alcohol withdrawal ([place]) 02/08/2017  . Cocaine abuse ([place]) 11/18/2016  . GI bleed 11/01/2016  . Alcohol abuse   . Major depressive disorder, recurrent severe without psychotic features ([place]) 07/06/2016  . Alcohol-induced mood disorder ([place]) 06/15/2016  . Seizure ([place]) 05/26/2016  . Tobacco abuse 05/26/2016  . Homeless 05/26/2016  . Essential hypertension 05/26/2016  . Alcohol dependence with alcohol-induced mood disorder ([place]) 02/14/2016  . Severe alcohol withdrawal without perceptual disturbances without complication ([place]) 02/77/4128  . Alcoholic hepatitis without ascites 02/14/2016  . Thrombocytopenia ([place]) 02/14/2016    Past Surgical History:  Procedure Laterality Date  . ESOPHAGOGASTRODUODENOSCOPY (EGD) WITH PROPOFOL N/A 11/02/2016   Procedure: ESOPHAGOGASTRODUODENOSCOPY (EGD) WITH PROPOFOL;  Surgeon: Carol Ada, MD;  Location: [facility] ENDOSCOPY;  Service: Endoscopy;  Laterality: N/A;  . HERNIA REPAIR    . LEG SURGERY         Home Medications    Prior to Admission medications   Medication Sig Start Date End Date Taking? Authorizing Provider  citalopram (CELEXA) 20 MG tablet Take 1  tablet (20 mg total) by mouth daily. For mood control 05/20/17   Money, Lowry Ram, FNP  gabapentin (NEURONTIN) 300 MG capsule Take 1 capsule (300 mg total) by mouth 3 (three) times daily. for withdrawal symptoms 05/19/17   Money, Lowry Ram, FNP  lisinopril (PRINIVIL,ZESTRIL) 10 MG tablet Take 1 tablet (10 mg total) by mouth daily. 05/20/17   Money, Lowry Ram, FNP  traZODone (DESYREL) 100 MG tablet Take 1 tablet (100 mg total) by mouth at bedtime as needed for sleep. 05/19/17   Money, Lowry Ram, FNP    Family History Family History  Problem Relation Age of Onset  . Diabetes Mother   . Alcoholism Mother   . Emphysema Father   . Lung cancer Father   . Alcoholism Father     Social History Social History   Tobacco Use  . Smoking status: Current Every Day Smoker    Packs/day: 1.00    Types: Cigarettes  . Smokeless tobacco: Never Used  Substance Use Topics  . Alcohol use: Yes    Comment: 4-5 40oz beers daily  . Drug use: Yes    Frequency: 3.0 times per week    Types: Cocaine    Comment: last use today 2 weeks ago (04-09-17)      Allergies   Tomato and Aspirin   Review of Systems Review of Systems  Respiratory: Positive for shortness of breath.   Cardiovascular: Negative for chest pain.  All other systems reviewed and are negative.    Physical Exam Updated  Vital Signs BP 124/84 (BP Location: Left Arm)   Pulse 79   Temp 97.8 F (36.6 C) (Oral)   Resp 16   Ht 5\' 7"  (1.702 m)   Wt 86.2 kg (190 lb)   SpO2 97%   BMI 29.76 kg/m   Physical Exam  Constitutional: He appears well-developed and well-nourished. No distress.  HENT:  Head: Normocephalic and atraumatic.  Right Ear: Hearing normal.  Left Ear: Hearing normal.  Nose: Nose normal.  Mouth/Throat: Oropharynx is clear and moist and mucous membranes are normal.  Eyes: Conjunctivae and EOM are normal. Pupils are equal, round, and reactive to light.  Neck: Normal range of motion. Neck supple.  Cardiovascular: Regular rhythm,  S1 normal and S2 normal. Exam reveals no gallop and no friction rub.  No murmur heard. Pulmonary/Chest: Effort normal and breath sounds normal. No respiratory distress. He exhibits no tenderness.  Abdominal: Soft. Normal appearance and bowel sounds are normal. There is no hepatosplenomegaly. There is no tenderness. There is no rebound, no guarding, no tenderness at McBurney's point and negative Murphy's sign. No hernia.  Musculoskeletal: Normal range of motion.  Neurological: He is alert. He has normal strength. No cranial nerve deficit or sensory deficit. Coordination normal. GCS eye subscore is 4. GCS verbal subscore is 5. GCS motor subscore is 6.  Sleepy but awakens to voice  Skin: Skin is warm, dry and intact. No rash noted. No cyanosis.  Psychiatric: His behavior is normal. Thought content normal.  Nursing note and vitals reviewed.    ED Treatments / Results  Labs (all labs ordered are listed, but only abnormal results are displayed) Labs Reviewed - No data to display  EKG  EKG Interpretation None       Radiology No results found.  Procedures Procedures (including critical care time)  Medications Ordered in ED Medications - No data to display   Initial Impression / Assessment and Plan / ED Course  I have reviewed the triage vital signs and the nursing notes.  Pertinent labs & imaging results that were available during my care of the patient were reviewed by me and considered in my medical decision making (see chart for details).     This is the 25th visit in 6 months for this patient.  He is a chronic alcoholic and frequently presents secondary to alcohol intoxication and homelessness.  He appears well and has no specific complaints.  We will allow to sober and then reevaluate.  Final Clinical Impressions(s) / ED Diagnoses   Final diagnoses:  Alcoholic intoxication without complication [facility])    ED Discharge Orders    None       Orpah Greek,  MD 05/23/17 [phone]

## 2017-05-23 NOTE — ED Triage Notes (Signed)
Patient is complaining that he is cold and wet. Patient states this is making him feel bad.

## 2017-05-25 ENCOUNTER — Emergency Department (HOSPITAL_COMMUNITY)
Admission: EM | Admit: 2017-05-25 | Discharge: 2017-05-25 | Disposition: A | Discharge status: Home / Self Care | Payer: Self-pay | Attending: Emergency Medicine | Admitting: Emergency Medicine

## 2017-05-25 ENCOUNTER — Encounter (HOSPITAL_COMMUNITY): Payer: Self-pay

## 2017-05-25 ENCOUNTER — Other Ambulatory Visit: Payer: Self-pay

## 2017-05-25 ENCOUNTER — Emergency Department (HOSPITAL_COMMUNITY): Payer: Self-pay

## 2017-05-25 DIAGNOSIS — I10 Essential (primary) hypertension: Secondary | ICD-10-CM | POA: Insufficient documentation

## 2017-05-25 DIAGNOSIS — Z79899 Other long term (current) drug therapy: Secondary | ICD-10-CM | POA: Insufficient documentation

## 2017-05-25 DIAGNOSIS — F17210 Nicotine dependence, cigarettes, uncomplicated: Secondary | ICD-10-CM | POA: Insufficient documentation

## 2017-05-25 DIAGNOSIS — J189 Pneumonia, unspecified organism: Secondary | ICD-10-CM | POA: Insufficient documentation

## 2017-05-25 DIAGNOSIS — J181 Lobar pneumonia, unspecified organism: Secondary | ICD-10-CM

## 2017-05-25 MED ORDER — LEVOFLOXACIN 750 MG PO TABS: 750.0000 mg | ORAL_TABLET | Freq: Every day | ORAL | 0 refills | Status: DC

## 2017-05-25 MED ORDER — LIDOCAINE HCL (PF) 1 % IJ SOLN
INTRAMUSCULAR | Status: AC
  Administered 2017-05-25: 2.1 mL
  Filled 2017-05-25: qty 5

## 2017-05-25 MED ORDER — ONDANSETRON 4 MG PO TBDP
4.0000 mg | ORAL_TABLET | Freq: Once | ORAL | Status: AC
Start: 2017-05-25 — End: 2017-05-25
  Administered 2017-05-25: 4 mg via ORAL
  Filled 2017-05-25: qty 1

## 2017-05-25 MED ORDER — CEFTRIAXONE SODIUM 1 G IJ SOLR
1.0000 g | Freq: Once | INTRAMUSCULAR | Status: AC
  Administered 2017-05-25: 1 g via INTRAMUSCULAR
  Filled 2017-05-25: qty 10

## 2017-05-25 MED ORDER — AZITHROMYCIN 250 MG PO TABS
500.0000 mg | ORAL_TABLET | Freq: Once | ORAL | Status: AC
  Administered 2017-05-25: 500 mg via ORAL
  Filled 2017-05-25: qty 2

## 2017-05-25 NOTE — ED Provider Notes (Signed)
[place] EMERGENCY DEPARTMENT Provider Note   CSN: [id number] Arrival date & time: 05/25/17  1918     History   Chief Complaint Chief Complaint  Patient presents with  . URI    HPI   Blood pressure (!) 138/96, pulse 75, temperature 98.7 F (37.1 C), temperature source Oral, height 5\' 7"  (1.702 m), weight 86.2 kg (190 lb), SpO2 99 %.  Clearance Chenault is a 52 y.o. male complaining of nausea, productive cough and shortness of breath worsening over the course of last day.  He denies fevers, chills, emesis, chest pain.  Past Medical History:  Diagnosis Date  . Alcoholic hepatitis   . Depression   . ETOH abuse   . Hypertension   . Pancreatitis   . Seizures [facility])     Patient Active Problem List   Diagnosis Date Noted  . Severe recurrent major depression without psychotic features ([place]) 05/15/2017  . Anemia 03/18/2017  . Acute blood loss anemia 02/21/2017  . UGI bleed 02/08/2017  . Alcohol withdrawal ([place]) 02/08/2017  . Cocaine abuse ([place]) 11/18/2016  . GI bleed 11/01/2016  . Alcohol abuse   . Major depressive disorder, recurrent severe without psychotic features ([place]) 07/06/2016  . 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 Seizure ([place]) 05/26/2016  . Tobacco abuse 05/26/2016  . Homeless 05/26/2016  . Essential hypertension 05/26/2016  . Alcohol dependence with alcohol-induced mood disorder ([place]) 02/14/2016  . Severe alcohol withdrawal without perceptual disturbances without complication ([place]) 61/60/7371  . Alcoholic hepatitis without ascites 02/14/2016  . Thrombocytopenia ([place]) 02/14/2016    Past Surgical History:  Procedure Laterality Date  . ESOPHAGOGASTRODUODENOSCOPY (EGD) WITH PROPOFOL N/A 11/02/2016   Procedure: ESOPHAGOGASTRODUODENOSCOPY (EGD) WITH PROPOFOL;  Surgeon: Carol Ada, MD;  [facility];  Service: Endoscopy;  Laterality: N/A;  . HERNIA REPAIR    . LEG SURGERY         Home Medications    Prior to  Admission medications   Medication Sig Start Date End Date Taking? Authorizing Provider  citalopram (CELEXA) 20 MG tablet Take 1 tablet (20 mg total) by mouth daily. For mood control 05/20/17   Money, Lowry Ram, FNP  gabapentin (NEURONTIN) 300 MG capsule Take 1 capsule (300 mg total) by mouth 3 (three) times daily. for withdrawal symptoms 05/19/17   Money, Lowry Ram, FNP  levofloxacin (LEVAQUIN) 750 MG tablet Take 1 tablet (750 mg total) by mouth daily. X 7 days 05/25/17   Raesean Bartoletti, Elmyra Ricks, PA-C  lisinopril (PRINIVIL,ZESTRIL) 10 MG tablet Take 1 tablet (10 mg total) by mouth daily. 05/20/17   Money, Lowry Ram, FNP  traZODone (DESYREL) 100 MG tablet Take 1 tablet (100 mg total) by mouth at bedtime as needed for sleep. 05/19/17   Money, Lowry Ram, FNP    Family History Family History  Problem Relation Age of Onset  . Diabetes Mother   . Alcoholism Mother   . Emphysema Father   . Lung cancer Father   . Alcoholism Father     Social History Social History   Tobacco Use  . Smoking status: Current Every Day Smoker    Packs/day: 1.00    Types: Cigarettes  . Smokeless tobacco: Never Used  Substance Use Topics  . Alcohol use: Yes    Comment: 4-5 40oz beers daily  . Drug use: Yes    Frequency: 3.0 times per week    Types: Cocaine    Comment: last use today 2 weeks ago (04-09-17)      Allergies  Tomato and Aspirin   Review of Systems Review of Systems  A complete review of systems was obtained and all systems are negative except as noted in the HPI and PMH.   Physical Exam Updated Vital Signs BP 125/77   Pulse 85   Temp 98.7 F (37.1 C) (Oral)   Resp 20   Ht 5\' 7"  (1.702 m)   Wt 86.2 kg (190 lb)   SpO2 99%   BMI 29.76 kg/m   Physical Exam  Constitutional: He is oriented to person, place, and time. He appears well-developed and well-nourished. No distress.  HENT:  Head: Normocephalic and atraumatic.  Right Ear: External ear normal.  Left Ear: External ear normal.    Mouth/Throat: Oropharynx is clear and moist. No oropharyngeal exudate.  No drooling or stridor. Posterior pharynx mildly erythematous no significant tonsillar hypertrophy. No exudate. Soft palate rises symmetrically. No TTP or induration under tongue.   No tenderness to palpation of frontal or bilateral maxillary sinuses.  Mild mucosal edema in the nares with scant rhinorrhea.  Bilateral tympanic membranes with normal architecture and good light reflex.    Eyes: Conjunctivae and EOM are normal. Pupils are equal, round, and reactive to light.  Neck: Normal range of motion. Neck supple.  Cardiovascular: Normal rate, regular rhythm and intact distal pulses.  Pulmonary/Chest: Effort normal and breath sounds normal. No stridor. No respiratory distress. He has no wheezes. He has no rales. He exhibits no tenderness.  Abdominal: Soft. There is no tenderness. There is no rebound and no guarding.  Musculoskeletal: Normal range of motion.  Neurological: He is alert and oriented to person, place, and time.  Skin: He is not diaphoretic.  Psychiatric: He has a normal mood and affect.  Nursing note and vitals reviewed.    ED Treatments / Results  Labs (all labs ordered are listed, but only abnormal results are displayed) Labs Reviewed - No data to display  EKG  EKG Interpretation None       Radiology Dg Chest 2 View  Result Date: 05/25/2017 CLINICAL DATA:  Cough and body pain EXAM: CHEST - 2 VIEW COMPARISON:  05/05/2017 FINDINGS: The heart size and mediastinal contours are within normal limits. Both lungs are well aerated. Increased density is noted in the medial aspect of the right lung base projecting in the right lower lobe consistent with early infiltrate. 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 IMPRESSION: Early infiltrate in the right lower lobe. Electronically Signed   By: Inez Catalina M.D.   On: 05/25/2017 20:06     Procedures Procedures (including critical care time)  Medications Ordered in ED Medications  cefTRIAXone (ROCEPHIN) injection 1 g (1 g Intramuscular Given 05/25/17 2249)  azithromycin (ZITHROMAX) tablet 500 mg (500 mg Oral Given 05/25/17 2248)  ondansetron (ZOFRAN-ODT) disintegrating tablet 4 mg (4 mg Oral Given 05/25/17 2248)  lidocaine (PF) (XYLOCAINE) 1 % injection (2.1 mLs  Given 05/25/17 2249)     Initial Impression / Assessment and Plan / ED Course  I have reviewed the triage vital signs and the nursing notes.  Pertinent labs & imaging results that were available during my care of the patient were reviewed by me and considered in my medical decision making (see chart for details).     Vitals:   05/25/17 1922 05/25/17 1923 05/25/17 2300  BP: (!) 138/96  125/77  Pulse: 75  85  Resp:   20  Temp: 98.7 F (37.1 C)    TempSrc: Oral  SpO2: 99%  99%  Weight:  86.2 kg (190 lb)   Height:  5\' 7"  (1.702 m)     Medications  cefTRIAXone (ROCEPHIN) injection 1 g (1 g Intramuscular Given 05/25/17 2249)  azithromycin (ZITHROMAX) tablet 500 mg (500 mg Oral Given 05/25/17 2248)  ondansetron (ZOFRAN-ODT) disintegrating tablet 4 mg (4 mg Oral Given 05/25/17 2248)  lidocaine (PF) (XYLOCAINE) 1 % injection (2.1 mLs  Given 05/25/17 2249)    Jesse Nosbisch is 52 y.o. male presenting with cough, shortness of breath and nausea onset yesterday, patient afebrile, vital signs reassuring, he saturating well on room air, lung sounds clear to auscultation.  This gentleman is a smoker and heavy drinker, chest x-ray reveals a early right lower lobe pneumonia.  Initially patient given Rocephin and azithromycin however after discussion with attending will write prescription for Levaquin for broader coverage at discharge.  Extensive discussion of return precautions and patient verbalized understanding and teach back technique.  Evaluation does not show pathology that would require ongoing emergent intervention or  inpatient treatment. Pt is hemodynamically stable and mentating appropriately. Discussed findings and plan with patient/guardian, who agrees with care plan. All questions answered. Return precautions discussed and outpatient follow up given.      Final Clinical Impressions(s) / ED Diagnoses   Final diagnoses:  Community acquired pneumonia of right lower lobe of lung [facility])    ED Discharge Orders        Ordered    levofloxacin (LEVAQUIN) 750 MG tablet  Daily     05/25/17 2321       Kysa Calais, Charna Elizabeth 05/25/17 2331    Julianne Rice, MD 05/30/17 0730

## 2017-05-25 NOTE — ED Triage Notes (Signed)
Pt endorses cold sx, sore throat and cough x 3 days. VS 138/88, HR 75.

## 2017-05-25 NOTE — Discharge Instructions (Signed)
Do not hesitate to return to the emergency room for any new, worsening or concerning symptoms. ° °Please obtain primary care using resource guide below. Let them know that you were seen in the emergency room and that they will need to obtain records for further outpatient management. ° ° °

## 2017-05-29 ENCOUNTER — Emergency Department (HOSPITAL_COMMUNITY): Admit: 2017-05-29 | Discharge: 2017-05-30 | Discharge status: Against Medical Advice | Payer: Self-pay

## 2017-05-30 NOTE — ED Triage Notes (Signed)
Pt continues to sleep in lobby

## 2017-05-30 NOTE — ED Triage Notes (Signed)
Pt sleeping in lobby, in no acute distress

## 2017-05-30 NOTE — ED Triage Notes (Signed)
Pt didn't answer when called again for triage

## 2017-05-31 ENCOUNTER — Emergency Department (HOSPITAL_COMMUNITY)
Admission: EM | Admit: 2017-05-31 | Discharge: 2017-06-01 | Disposition: A | Discharge status: Home / Self Care | Payer: Self-pay | Attending: Emergency Medicine | Admitting: Emergency Medicine

## 2017-05-31 DIAGNOSIS — F10920 Alcohol use, unspecified with intoxication, uncomplicated: Secondary | ICD-10-CM

## 2017-05-31 DIAGNOSIS — F1010 Alcohol abuse, uncomplicated: Secondary | ICD-10-CM

## 2017-05-31 DIAGNOSIS — I10 Essential (primary) hypertension: Secondary | ICD-10-CM | POA: Insufficient documentation

## 2017-05-31 DIAGNOSIS — Z79899 Other long term (current) drug therapy: Secondary | ICD-10-CM | POA: Insufficient documentation

## 2017-05-31 DIAGNOSIS — F17210 Nicotine dependence, cigarettes, uncomplicated: Secondary | ICD-10-CM | POA: Insufficient documentation

## 2017-05-31 DIAGNOSIS — F10120 Alcohol abuse with intoxication, uncomplicated: Secondary | ICD-10-CM | POA: Insufficient documentation

## 2017-05-31 NOTE — ED Triage Notes (Signed)
EMS stated, he wants to be detoxed, He smells ETOH.

## 2017-05-31 NOTE — ED Notes (Signed)
Pt reports he wants to quit drinking, states he had a "full 40 today."  Pt is alert and oriented.  Speech is slurred, but pt was able to ambulate to the wheelchair and then to the chairs in the waiting room.  Denies S/I

## 2017-06-01 LAB — COMPREHENSIVE METABOLIC PANEL
ALBUMIN: 3.7 g/dL (ref 3.5–5.0)
ALT: 50 U/L (ref 17–63)
ANION GAP: 13 (ref 5–15)
AST: 74 U/L — ABNORMAL HIGH (ref 15–41)
Alkaline Phosphatase: 97 U/L (ref 38–126)
BILIRUBIN TOTAL: 0.4 mg/dL (ref 0.3–1.2)
BUN: <5 mg/dL — ABNORMAL LOW (ref 6–20)
CO2: 21 mmol/L — ABNORMAL LOW (ref 22–32)
Calcium: 8.3 mg/dL — ABNORMAL LOW (ref 8.9–10.3)
Chloride: 106 mmol/L (ref 101–111)
Creatinine, Ser: 0.83 mg/dL (ref 0.61–1.24)
GFR calc non Af Amer: >60 mL/min (ref >60)
GLUCOSE: 92 mg/dL (ref 65–99)
POTASSIUM: 4 mmol/L (ref 3.5–5.1)
Sodium: 140 mmol/L (ref 135–145)
TOTAL PROTEIN: 7 g/dL (ref 6.5–8.1)

## 2017-06-01 LAB — CBC WITH DIFFERENTIAL/PLATELET
BASOS ABS: 0.1 10*3/uL (ref 0.0–0.1)
Basophils Relative: 2 %
EOS ABS: 0.2 10*3/uL (ref 0.0–0.7)
Eosinophils Relative: 7 %
HEMATOCRIT: 30.5 % — AB (ref 39.0–52.0)
Hemoglobin: 9 g/dL — ABNORMAL LOW (ref 13.0–17.0)
LYMPHS ABS: 1.5 10*3/uL (ref 0.7–4.0)
Lymphocytes Relative: 43 %
MCH: 21.1 pg — ABNORMAL LOW (ref 26.0–34.0)
MCHC: 29.5 g/dL — ABNORMAL LOW (ref 30.0–36.0)
MCV: 71.4 fL — ABNORMAL LOW (ref 78.0–100.0)
Monocytes Absolute: 0.4 10*3/uL (ref 0.1–1.0)
Monocytes Relative: 12 %
NEUTROS ABS: 1.3 10*3/uL — AB (ref 1.7–7.7)
Neutrophils Relative %: 36 %
Platelets: 208 10*3/uL (ref 150–400)
RBC: 4.27 MIL/uL (ref 4.22–5.81)
RDW: 18.8 % — AB (ref 11.5–15.5)
WBC: 3.5 10*3/uL — ABNORMAL LOW (ref 4.0–10.5)

## 2017-06-01 LAB — ETHANOL: Alcohol, Ethyl (B): 265 mg/dL — ABNORMAL HIGH (ref <10)

## 2017-06-01 LAB — LIPASE, BLOOD: LIPASE: 44 U/L (ref 11–51)

## 2017-06-01 MED ORDER — LORAZEPAM 2 MG/ML IJ SOLN
0.0000 mg | Freq: Four times a day (QID) | INTRAMUSCULAR | Status: DC
  Administered 2017-06-01: 2 mg via INTRAVENOUS
  Filled 2017-06-01: qty 1

## 2017-06-01 MED ORDER — SODIUM CHLORIDE 0.9 % IV BOLUS (SEPSIS)
1000.0000 mL | Freq: Once | INTRAVENOUS | Status: AC
  Administered 2017-06-01: 1000 mL via INTRAVENOUS

## 2017-06-01 MED ORDER — ONDANSETRON HCL 4 MG/2ML IJ SOLN
4.0000 mg | Freq: Once | INTRAMUSCULAR | Status: AC
Start: 2017-06-01 — End: 2017-06-01
  Administered 2017-06-01: 4 mg via INTRAVENOUS
  Filled 2017-06-01: qty 2

## 2017-06-01 MED ORDER — VITAMIN B-1 100 MG PO TABS: 100.0000 mg | ORAL_TABLET | Freq: Every day | ORAL | Status: DC

## 2017-06-01 MED ORDER — THIAMINE HCL 100 MG/ML IJ SOLN: 100.0000 mg | Freq: Every day | INTRAMUSCULAR | Status: DC

## 2017-06-01 MED ORDER — LORAZEPAM 1 MG PO TABS
0.0000 mg | ORAL_TABLET | Freq: Four times a day (QID) | ORAL | Status: DC
  Administered 2017-06-01: 1 mg via ORAL
  Filled 2017-06-01: qty 1

## 2017-06-01 MED ORDER — PANTOPRAZOLE SODIUM 40 MG IV SOLR
40.0000 mg | Freq: Once | INTRAVENOUS | Status: AC
  Administered 2017-06-01: 40 mg via INTRAVENOUS
  Filled 2017-06-01: qty 40

## 2017-06-01 MED ORDER — LORAZEPAM 2 MG/ML IJ SOLN: 0.0000 mg | Freq: Two times a day (BID) | INTRAMUSCULAR | Status: DC

## 2017-06-01 MED ORDER — LORAZEPAM 1 MG PO TABS: 0.0000 mg | ORAL_TABLET | Freq: Two times a day (BID) | ORAL | Status: DC

## 2017-06-01 NOTE — ED Provider Notes (Signed)
[place] EMERGENCY DEPARTMENT Provider Note   CSN: [id number] Arrival date & time: 05/31/17  1497     History   Chief Complaint Chief Complaint  Patient presents with  . Alcohol Problem    HPI Barry Moore is a 52 y.o. male.  Patient with PMH of alcohol abuse, pancreatitis, alcoholic hepatitis, and withdrawal and seizures presents to the ED with a chief complaint of abdominal pain.  He states that the pain started 3 days ago.  He reports having nausea and vomiting for the same amount of time.  He reports having diarrhea x 1 week.  He has not taken anything for his symptoms but has tried to "drown the pain in alcohol."  He states that his last drink was today.  He denies any other associated symptoms.  Denies SI/HI.   The history is provided by the patient. No language interpreter was used.    Past Medical History:  Diagnosis Date  . Alcoholic hepatitis   . Depression   . ETOH abuse   . Hypertension   . Pancreatitis   . Seizures [facility])     Patient Active Problem List   Diagnosis Date Noted  . Severe recurrent major depression without psychotic features ([place]) 05/15/2017  . Anemia 03/18/2017  . Acute blood loss anemia 02/21/2017  . UGI bleed 02/08/2017  . Alcohol withdrawal ([place]) 02/08/2017  . Cocaine abuse ([place]) 11/18/2016  . GI bleed 11/01/2016  . Alcohol abuse   . Major depressive disorder, recurrent severe without psychotic features ([place]) 07/06/2016  . Alcohol-induced mood disorder ([place]) 06/15/2016  . Seizure ([place]) 05/26/2016  . Tobacco abuse 05/26/2016  . Homeless 05/26/2016  . Essential hypertension 05/26/2016  . Alcohol dependence with alcohol-induced mood disorder ([place]) 02/14/2016  . Severe alcohol withdrawal without perceptual disturbances without complication ([place]) 02/63/7858  . Alcoholic hepatitis without ascites 02/14/2016  . Thrombocytopenia ([place]) 02/14/2016    Past Surgical History:  Procedure Laterality Date  .  ESOPHAGOGASTRODUODENOSCOPY (EGD) WITH PROPOFOL N/A 11/02/2016   Procedure: ESOPHAGOGASTRODUODENOSCOPY (EGD) WITH PROPOFOL;  Surgeon: Carol Ada, MD;  [facility] ENDOS[facility];  Service: Endoscopy;  Laterality: N/A;  . HERNIA REPAIR    . LEG SURGERY         Home Medications    Prior to Admission medications   Medication Sig Start Date End Date Taking? Authorizing Provider  citalopram (CELEXA) 20 MG tablet Take 1 tablet (20 mg total) by mouth daily. For mood control 05/20/17   Money, Lowry Ram, FNP  gabapentin (NEURONTIN) 300 MG capsule Take 1 capsule (300 mg total) by mouth 3 (three) times daily. for withdrawal symptoms 05/19/17   Money, Lowry Ram, FNP  levofloxacin (LEVAQUIN) 750 MG tablet Take 1 tablet (750 mg total) by mouth daily. X 7 days Patient not taking: Reported on 05/30/2017 05/25/17   Pisciotta, Elmyra Ricks, PA-C  lisinopril (PRINIVIL,ZESTRIL) 10 MG tablet Take 1 tablet (10 mg total) by mouth daily. 05/20/17   Money, Lowry Ram, FNP  traZODone (DESYREL) 100 MG tablet Take 1 tablet (100 mg total) by mouth at bedtime as needed for sleep. 05/19/17   Money, Lowry Ram, FNP    Family History Family History  Problem Relation Age of Onset  . Diabetes Mother   . Alcoholism Mother   . Emphysema Father   . Lung cancer Father   . Alcoholism Father     Social History Social History   Tobacco Use  . Smoking status: Current Every Day Smoker    Packs/day: 1.00  Types: Cigarettes  . Smokeless tobacco: Never Used  Substance Use Topics  . Alcohol use: Yes    Comment: 4-5 40oz beers daily  . Drug use: Yes    Frequency: 3.0 times per week    Types: Cocaine    Comment: last use today 2 weeks ago (04-09-17)      Allergies   Tomato and Aspirin   Review of Systems Review of Systems  All other systems reviewed and are negative.    Physical Exam Updated Vital Signs BP (!) 148/75 (BP Location: Right Arm)   Pulse (!) 103   Temp 99 F (37.2 C) (Oral)   Resp 16   SpO2 95%   Physical  Exam  Constitutional: He is oriented to person, place, and time. He appears well-developed and well-nourished.  HENT:  Head: Normocephalic and atraumatic.  Eyes: Conjunctivae and EOM are normal. Pupils are equal, round, and reactive to light. Right eye exhibits no discharge. Left eye exhibits no discharge. No scleral icterus.  Neck: Normal range of motion. Neck supple. No JVD present.  Cardiovascular: Normal rate, regular rhythm and normal heart sounds. Exam reveals no gallop and no friction rub.  No murmur heard. Pulmonary/Chest: Effort normal and breath sounds normal. No respiratory distress. He has no wheezes. He has no rales. He exhibits no tenderness.  Abdominal: Soft. He exhibits no distension and no mass. There is no tenderness. There is no rebound and no guarding.  Significant upper abdominal tenderness No focal lower abdominal tenderness  Musculoskeletal: Normal range of motion. He exhibits no edema or tenderness.  Neurological: He is alert and oriented to person, place, and time.  Skin: Skin is warm and dry.  Psychiatric: He has a normal mood and affect. His behavior is normal. Judgment and thought content normal.  Nursing note and vitals reviewed.    ED Treatments / Results  Labs (all labs ordered are listed, but only abnormal results are displayed) Labs Reviewed  CBC WITH DIFFERENTIAL/PLATELET - Abnormal; Notable for the following components:      Result Value   WBC 3.5 (*)    Hemoglobin 9.0 (*)    HCT 30.5 (*)    MCV 71.4 (*)    MCH 21.1 (*)    MCHC 29.5 (*)    RDW 18.8 (*)    Neutro Abs 1.3 (*)    All other components within normal limits  COMPREHENSIVE METABOLIC PANEL - Abnormal; Notable for the following components:   CO2 21 (*)    BUN <5 (*)    Calcium 8.3 (*)    AST 74 (*)    All other components within normal limits  ETHANOL - Abnormal; Notable for the following components:   Alcohol, Ethyl (B) 265 (*)    All other components within normal limits    LIPASE, BLOOD    EKG  EKG Interpretation None       Radiology No results found.  Procedures Procedures (including critical care time)  Medications Ordered in ED Medications  LORazepam (ATIVAN) injection 0-4 mg ( Intravenous See Alternative 06/01/17 1324)    Or  LORazepam (ATIVAN) tablet 0-4 mg (1 mg Oral Given 06/01/17 0608)  LORazepam (ATIVAN) injection 0-4 mg (not administered)    Or  LORazepam (ATIVAN) tablet 0-4 mg (not administered)  thiamine (VITAMIN B-1) tablet 100 mg (not administered)    Or  thiamine (B-1) injection 100 mg (not administered)  sodium chloride 0.9 % bolus 1,000 mL (0 mLs Intravenous Stopped 06/01/17 0521)  ondansetron (ZOFRAN)  injection 4 mg (4 mg Intravenous Given 06/01/17 0137)  pantoprazole (PROTONIX) injection 40 mg (40 mg Intravenous Given 06/01/17 0138)     Initial Impression / Assessment and Plan / ED Course  I have reviewed the triage vital signs and the nursing notes.  Pertinent labs & imaging results that were available during my care of the patient were reviewed by me and considered in my medical decision making (see chart for details).     Patient is intoxicated.  He is complaining of abdominal pain.  Laboratory workup is reassuring.  Lipase not elevated.  LFTs look better than in the past.  Ethanol level was 265.  Patient has had time to sober up.  He is drinking without any problems now.  I reassessed the patient, and he feels that he is ready to be discharged.  His current CIWA is 8, will give one dose of ativan here prior to discharge.  Abdominal pain and vomiting likely alcohol gastritis.  Final Clinical Impressions(s) / ED Diagnoses   Final diagnoses:  Alcohol abuse  Alcoholic intoxication without complication [facility])    ED Discharge Orders    None       Montine Circle, PA-C 15/40/08 [id number]    Delora Fuel, MD 95/09/32 [phone]

## 2017-06-01 NOTE — ED Notes (Signed)
Pt was able to drink fluids without any problems

## 2017-06-01 NOTE — ED Notes (Signed)
PT states understanding of care given, follow up care. PT ambulated from ED to car with a steady gait.  

## 2017-06-02 ENCOUNTER — Emergency Department (HOSPITAL_COMMUNITY)
Admission: EM | Admit: 2017-06-02 | Discharge: 2017-06-03 | Disposition: A | Discharge status: Home / Self Care | Payer: Self-pay | Attending: Emergency Medicine | Admitting: Emergency Medicine

## 2017-06-02 ENCOUNTER — Other Ambulatory Visit: Payer: Self-pay

## 2017-06-02 ENCOUNTER — Encounter (HOSPITAL_COMMUNITY): Payer: Self-pay | Admitting: *Deleted

## 2017-06-02 DIAGNOSIS — F17210 Nicotine dependence, cigarettes, uncomplicated: Secondary | ICD-10-CM | POA: Insufficient documentation

## 2017-06-02 DIAGNOSIS — F10920 Alcohol use, unspecified with intoxication, uncomplicated: Secondary | ICD-10-CM | POA: Insufficient documentation

## 2017-06-02 DIAGNOSIS — I10 Essential (primary) hypertension: Secondary | ICD-10-CM | POA: Insufficient documentation

## 2017-06-02 NOTE — ED Notes (Signed)
[place]  I guess I'm here because I'm too drunk."

## 2017-06-02 NOTE — ED Provider Notes (Signed)
TIME SEEN: 11:40 PM  CHIEF COMPLAINT: Alcohol abuse  HPI: Patient is a 52 year old male well-known to our emergency department 27 visits in the past 6 months with history of alcohol abuse, depression, chronic pancreatitis who presents to the emergency department today intoxicated.  It is difficult to get a history from patient this time given his intoxication.  He states to me "I am just dying".  He does not endorse any specific symptoms at this time.  Does not endorse suicidality.  No HI or hallucinations.  He states that he has drank "a lot" today.  States he is currently homeless.  ROS: Level 5 caveat secondary to alcohol intoxication  PAST MEDICAL HISTORY/PAST SURGICAL HISTORY:  Past Medical History:  Diagnosis Date  . Alcoholic hepatitis   . Depression   . ETOH abuse   . Hypertension   . Pancreatitis   . Seizures ([place])     MEDICATIONS:  Prior to Admission medications   Medication Sig Start Date End Date Taking? Authorizing Provider  citalopram (CELEXA) 20 MG tablet Take 1 tablet (20 mg total) by mouth daily. For mood control Patient not taking: Reported on 06/02/2017 05/20/17   Money, Lowry Ram, FNP  gabapentin (NEURONTIN) 300 MG capsule Take 1 capsule (300 mg total) by mouth 3 (three) times daily. for withdrawal symptoms Patient not taking: Reported on 06/02/2017 05/19/17   Money, Lowry Ram, FNP  levofloxacin (LEVAQUIN) 750 MG tablet Take 1 tablet (750 mg total) by mouth daily. X 7 days Patient not taking: Reported on 05/30/2017 05/25/17   Pisciotta, Elmyra Ricks, PA-C  lisinopril (PRINIVIL,ZESTRIL) 10 MG tablet Take 1 tablet (10 mg total) by mouth daily. Patient not taking: Reported on 06/02/2017 05/20/17   Money, Lowry Ram, FNP  traZODone (DESYREL) 100 MG tablet Take 1 tablet (100 mg total) by mouth at bedtime as needed for sleep. Patient not taking: Reported on 06/02/2017 05/19/17   Money, Lowry Ram, FNP    ALLERGIES:  Allergies  Allergen Reactions  . Tomato Shortness Of Breath and Nausea And  Vomiting  . Aspirin Nausea And Vomiting    SOCIAL HISTORY:  Social History   Tobacco Use  . Smoking status: Current Every Day Smoker    Packs/day: 1.00    Types: Cigarettes  . Smokeless tobacco: Never Used  Substance Use Topics  . Alcohol use: Yes    Comment: 4-5 40oz beers daily    FAMILY HISTORY: Family History  Problem Relation Age of Onset  . Diabetes Mother   . Alcoholism Mother   . Emphysema Father   . Lung cancer Father   . Alcoholism Father     EXAM: BP (!) 164/98 (BP Location: Right Arm)   Pulse (!) 107   Temp 98.8 F (37.1 C) (Oral)   Resp 16   Ht 5\' 7"  (1.702 m)   Wt 86.2 kg (190 lb)   SpO2 96%   BMI 29.76 kg/m  CONSTITUTIONAL: Alert and oriented and responds appropriately to questions.  Ill-appearing, smells strongly of alcohol, intoxicated, eating a sandwich and drinking a soda when I walk into the room HEAD: Normocephalic, atraumatic EYES: Conjunctivae clear, pupils appear equal, EOMI ENT: normal nose; moist mucous membranes NECK: Supple, no meningismus, no nuchal rigidity, no LAD  CARD: Regular and minimally tachycardic; S1 and S2 appreciated; no murmurs, no clicks, no rubs, no gallops RESP: Normal chest excursion without splinting or tachypnea; breath sounds clear and equal bilaterally; no wheezes, no rhonchi, no rales, no hypoxia or respiratory distress, speaking full sentences  ABD/GI: Normal bowel sounds; non-distended; soft, mildly tender throughout the upper abdomen, no rebound, no guarding, no peritoneal signs, no hepatosplenomegaly BACK:  The back appears normal and is non-tender to palpation, there is no CVA tenderness EXT: Normal ROM in all joints; non-tender to palpation; no edema; normal capillary refill; no cyanosis, no calf tenderness or swelling    SKIN: Normal color for age and race; warm; no rash NEURO: Moves all extremities equally PSYCH: Patient is intoxicated.  Has poor personal hygiene.  MEDICAL DECISION MAKING: Patient here with  alcohol intoxication.  It is difficult to determine what brought patient to the emergency department.  He does not seem to have any specific complaints.  He will need to be monitored until clinically sober and reassess.  Hemodynamically stable.  No sign of trauma on exam.  ED PROGRESS: Patient has been resting comfortably with CIWAs scores of 0.  I woke him up and he states he has no complaints other than chronic abdominal pain.  Patient laughs when I am talking to him.  He has been able to eat and drink here.  No vomiting.  He states he is ready to go home "whenever we are ready to discharge him".  I feel he is safe for discharge.  He has no acute emergent complaints today.   At this time, I do not feel there is any life-threatening condition present. I have reviewed and discussed all results (EKG, imaging, lab, urine as appropriate) and exam findings with patient/family. I have reviewed nursing notes and appropriate previous records.  I feel the patient is safe to be discharged home without further emergent workup and can continue workup as an outpatient as needed. Discussed usual and customary return precautions. Patient/family verbalize understanding and are comfortable with this plan.  Outpatient follow-up has been provided if needed. All questions have been answered.     Katryna Tschirhart, Delice Bison, DO 06/03/17 [phone]

## 2017-06-02 NOTE — ED Triage Notes (Signed)
Pt brought in by EMS for alcohol use  Pt requested to come here for detox  Pt states he cannot afford his medications and is noncompliant when he has them  Pt states he has seizures when he attempts to detox himself

## 2017-06-03 MED ORDER — GABAPENTIN 300 MG PO CAPS: 300.0000 mg | ORAL_CAPSULE | Freq: Three times a day (TID) | ORAL | Status: DC

## 2017-06-03 MED ORDER — CITALOPRAM HYDROBROMIDE 10 MG PO TABS: 20.0000 mg | ORAL_TABLET | Freq: Every day | ORAL | Status: DC

## 2017-06-03 MED ORDER — THIAMINE HCL 100 MG/ML IJ SOLN: 100.0000 mg | Freq: Every day | INTRAMUSCULAR | Status: DC

## 2017-06-03 MED ORDER — LORAZEPAM 1 MG PO TABS: 0.0000 mg | ORAL_TABLET | Freq: Four times a day (QID) | ORAL | Status: DC

## 2017-06-03 MED ORDER — VITAMIN B-1 100 MG PO TABS: 100.0000 mg | ORAL_TABLET | Freq: Every day | ORAL | Status: DC

## 2017-06-03 MED ORDER — LORAZEPAM 1 MG PO TABS: 0.0000 mg | ORAL_TABLET | Freq: Two times a day (BID) | ORAL | Status: DC

## 2017-06-03 MED ORDER — LORAZEPAM 2 MG/ML IJ SOLN: 0.0000 mg | Freq: Two times a day (BID) | INTRAMUSCULAR | Status: DC

## 2017-06-03 MED ORDER — TRAZODONE HCL 100 MG PO TABS: 100.0000 mg | ORAL_TABLET | Freq: Every evening | ORAL | Status: DC | PRN

## 2017-06-03 MED ORDER — LISINOPRIL 10 MG PO TABS
10.0000 mg | ORAL_TABLET | Freq: Every day | ORAL | Status: DC
  Filled 2017-06-03: qty 1

## 2017-06-03 MED ORDER — LORAZEPAM 2 MG/ML IJ SOLN: 0.0000 mg | Freq: Four times a day (QID) | INTRAMUSCULAR | Status: DC

## 2017-06-03 NOTE — Discharge Instructions (Signed)
To find a primary care or specialty doctor please call [phone] or [phone] to access "[place] Find a Doctor Service." ° °You may also go on the [facility] website at [email or web address] ° °There are also multiple [facility], [facility], [facility] and [facility] practices throughout the [place] that are frequently accepting new patients. You may find a clinic that is close to your home and contact them. ° °[facility] -  °[address] °[place] [address] °[phone] ° ° °[facility] -  °[address] °[place] [address] °[phone] ° ° °[facility] - °[address]  °[place] [address] °[phone] ° ° °

## 2017-06-06 ENCOUNTER — Emergency Department (HOSPITAL_COMMUNITY)
Admission: EM | Admit: 2017-06-06 | Discharge: 2017-06-07 | Disposition: A | Discharge status: Home / Self Care | Payer: Self-pay | Attending: Emergency Medicine | Admitting: Emergency Medicine

## 2017-06-06 ENCOUNTER — Encounter (HOSPITAL_COMMUNITY): Payer: Self-pay | Admitting: Emergency Medicine

## 2017-06-06 DIAGNOSIS — F332 Major depressive disorder, recurrent severe without psychotic features: Secondary | ICD-10-CM

## 2017-06-06 DIAGNOSIS — F1094 Alcohol use, unspecified with alcohol-induced mood disorder: Secondary | ICD-10-CM

## 2017-06-06 DIAGNOSIS — R45851 Suicidal ideations: Secondary | ICD-10-CM | POA: Insufficient documentation

## 2017-06-06 DIAGNOSIS — D696 Thrombocytopenia, unspecified: Secondary | ICD-10-CM

## 2017-06-06 DIAGNOSIS — I10 Essential (primary) hypertension: Secondary | ICD-10-CM | POA: Insufficient documentation

## 2017-06-06 DIAGNOSIS — Y908 Blood alcohol level of 240 mg/100 ml or more: Secondary | ICD-10-CM | POA: Insufficient documentation

## 2017-06-06 DIAGNOSIS — F1024 Alcohol dependence with alcohol-induced mood disorder: Secondary | ICD-10-CM | POA: Diagnosis present

## 2017-06-06 DIAGNOSIS — F17210 Nicotine dependence, cigarettes, uncomplicated: Secondary | ICD-10-CM | POA: Insufficient documentation

## 2017-06-06 NOTE — ED Notes (Signed)
Two labeled patient belongings bags placed in triage cabinet.

## 2017-06-06 NOTE — ED Notes (Signed)
Patient changed into paper scrubs. 

## 2017-06-06 NOTE — ED Provider Notes (Signed)
[facility] DEPT Provider Note   CSN: [id number] Arrival date & time: 06/06/17  2218     History   Chief Complaint Chief Complaint  Patient presents with  . Suicidal    HPI Barry Moore is a 52 y.o. male.  Level 5 caveat secondary to alcohol intoxication.  52 year old male with a history of 27 visits in the past 6 months presents to the emergency department intoxicated.  He cannot tell me specifically why he is here.  He is complaining about his backpack and carrying things.  In triage, patient referencing suicidal thoughts and requesting detox.  He has been seen for the specific complaints on a number of prior occasions and often leaves in the morning when he is sober and no longer suicidal.  He denies any other illicit drug use today.  He is homeless.      Past Medical History:  Diagnosis Date  . Alcoholic hepatitis   . Depression   . ETOH abuse   . Hypertension   . Pancreatitis   . Seizures [facility])     Patient Active Problem List   Diagnosis Date Noted  . Severe recurrent major depression without psychotic features ([place]) 05/15/2017  . Anemia 03/18/2017  . Acute blood loss anemia 02/21/2017  . UGI bleed 02/08/2017  . Alcohol withdrawal ([place]) 02/08/2017  . Cocaine abuse ([place]) 11/18/2016  . GI bleed 11/01/2016  . Alcohol abuse   . Major depressive disorder, recurrent severe without psychotic features ([place]) 07/06/2016  . 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 Seizure ([place]) 05/26/2016  . Tobacco abuse 05/26/2016  . Homeless 05/26/2016  . Essential hypertension 05/26/2016  . Alcohol dependence with alcohol-induced mood disorder ([place]) 02/14/2016  . Severe alcohol withdrawal without perceptual disturbances without complication ([place]) 96/22/2979  . Alcoholic hepatitis without ascites 02/14/2016  . Thrombocytopenia ([place]) 02/14/2016    Past Surgical History:  Procedure Laterality Date  . ESOPHAGOGASTRODUODENOSCOPY (EGD) WITH  PROPOFOL N/A 11/02/2016   Procedure: ESOPHAGOGASTRODUODENOSCOPY (EGD) WITH PROPOFOL;  Surgeon: Carol Ada, MD;  Location: [facility] ENDOSCOPY;  Service: Endoscopy;  Laterality: N/A;  . HERNIA REPAIR    . LEG SURGERY         Home Medications    Prior to Admission medications   Medication Sig Start Date End Date Taking? Authorizing Provider  citalopram (CELEXA) 20 MG tablet Take 1 tablet (20 mg total) by mouth daily. For mood control Patient not taking: Reported on 06/02/2017 05/20/17   Money, Lowry Ram, FNP  gabapentin (NEURONTIN) 300 MG capsule Take 1 capsule (300 mg total) by mouth 3 (three) times daily. for withdrawal symptoms Patient not taking: Reported on 06/02/2017 05/19/17   Money, Lowry Ram, FNP  lisinopril (PRINIVIL,ZESTRIL) 10 MG tablet Take 1 tablet (10 mg total) by mouth daily. Patient not taking: Reported on 06/02/2017 05/20/17   Money, Lowry Ram, FNP  traZODone (DESYREL) 100 MG tablet Take 1 tablet (100 mg total) by mouth at bedtime as needed for sleep. Patient not taking: Reported on 06/02/2017 05/19/17   Money, Lowry Ram, FNP    Family History Family History  Problem Relation Age of Onset  . Diabetes Mother   . Alcoholism Mother   . Emphysema Father   . Lung cancer Father   . Alcoholism Father     Social History Social History   Tobacco Use  . Smoking status: Current Every Day Smoker    Packs/day: 1.00    Types: Cigarettes  . Smokeless tobacco: Never Used  Substance Use  Topics  . Alcohol use: Yes    Comment: 4-5 40oz beers daily  . Drug use: Yes    Frequency: 3.0 times per week    Types: Cocaine    Comment: last use today 2 weeks ago (04-09-17)      Allergies   Tomato and Aspirin   Review of Systems Review of Systems  Unable to perform ROS: Other  Patient intoxicated   Physical Exam Updated Vital Signs BP 132/78 (BP Location: Right Arm)   Pulse 98   Temp 98.7 F (37.1 C) (Oral)   Resp 18   SpO2 100%   Physical Exam  Constitutional: He is oriented to  person, place, and time. He appears well-developed and well-nourished. No distress.  Nontoxic appearing and in no distress. Dirty.  HENT:  Head: Normocephalic and atraumatic.  Eyes: Conjunctivae and EOM are normal. No scleral icterus.  Neck: Normal range of motion.  Pulmonary/Chest: Effort normal. No stridor. No respiratory distress.  Respirations even and unlabored  Musculoskeletal: Normal range of motion.  Neurological: He is alert and oriented to person, place, and time. He exhibits normal muscle tone. Coordination normal.  Skin: Skin is warm and dry. No rash noted. He is not diaphoretic. No erythema. No pallor.  Psychiatric: He has a normal mood and affect. His speech is slurred. He is slowed.  Nursing note and vitals reviewed.    ED Treatments / Results  Labs (all labs ordered are listed, but only abnormal results are displayed) Labs Reviewed  COMPREHENSIVE METABOLIC PANEL - Abnormal; Notable for the following components:      Result Value   Calcium 8.7 (*)    AST 113 (*)    ALT 76 (*)    All other components within normal limits  ETHANOL - Abnormal; Notable for the following components:   Alcohol, Ethyl (B) 339 (*)    All other components within normal limits  ACETAMINOPHEN LEVEL - Abnormal; Notable for the following components:   Acetaminophen (Tylenol), Serum <10 (*)    All other components within normal limits  CBC - Abnormal; Notable for the following components:   WBC 3.6 (*)    Hemoglobin 9.4 (*)    HCT 31.0 (*)    MCV 71.3 (*)    MCH 21.6 (*)    RDW 18.8 (*)    Platelets 82 (*)    All other components within normal limits  SALICYLATE LEVEL  RAPID URINE DRUG SCREEN, HOSP PERFORMED    EKG  EKG Interpretation None       Radiology No results found.  Procedures Procedures (including critical care time)  Medications Ordered in ED Medications  LORazepam (ATIVAN) injection 0-4 mg (not administered)    Or  LORazepam (ATIVAN) tablet 0-4 mg (not  administered)  LORazepam (ATIVAN) injection 0-4 mg (not administered)    Or  LORazepam (ATIVAN) tablet 0-4 mg (not administered)  thiamine (VITAMIN B-1) tablet 100 mg (not administered)    Or  thiamine (B-1) injection 100 mg (not administered)  ondansetron (ZOFRAN) tablet 4 mg (not administered)  nicotine (NICODERM CQ - dosed in mg/24 hours) patch 21 mg (not administered)     6:28 AM Patient reassessed.  He is easily awoken.  Speech is clear.  He is clinically sober.  Patient does report continued suicidal ideations without plan.  Will have him evaluated by psychiatry.  Holding orders placed as well as CIWA protocol orders.   Initial Impression / Assessment and Plan / ED Course  I have reviewed  the triage vital signs and the nursing notes.  Pertinent labs & imaging results that were available during my care of the patient were reviewed by me and considered in my medical decision making (see chart for details).     Patient presenting to the emergency department intoxicated.  History of multiple prior ED visits with underlying psychiatric complaints.  The patient has been able to sober in the emergency department for just under 9 hours.  On repeat assessment he continues to complain of suicidal ideations.  No expressed plan.  We will have him evaluated by psychiatry for further recommendations.  The patient has been medically cleared at this time.  He does have a thrombocytopenia which is fairly new, but historically chronic on chart review.  He was noted to have a similar and slightly more severe thrombocytopenia in August 2018.  This is most likely attributable to his ongoing alcohol abuse.  Disposition to be determined by oncoming ED provider.   Final Clinical Impressions(s) / ED Diagnoses   Final diagnoses:  Alcohol-induced mood disorder ([place])  Thrombocytopenia [facility])    ED Discharge Orders    None       Antonietta Breach, PA-C 06/07/17 0631    Palumbo, April, MD 06/07/17  [phone]

## 2017-06-06 NOTE — ED Triage Notes (Signed)
Per [facility] pt reports having SI thoughts and requesting detox. Patient calm and cooperative in triage.

## 2017-06-06 NOTE — ED Notes (Signed)
Bed: WTR7 Expected date:  Expected time:  Means of arrival:  Comments: 

## 2017-06-06 NOTE — ED Notes (Signed)
Bed: WTR6 Expected date:  Expected time:  Means of arrival:  Comments: 

## 2017-06-07 LAB — COMPREHENSIVE METABOLIC PANEL
ALBUMIN: 4 g/dL (ref 3.5–5.0)
ALK PHOS: 110 U/L (ref 38–126)
ALT: 76 U/L — ABNORMAL HIGH (ref 17–63)
ANION GAP: 13 (ref 5–15)
AST: 113 U/L — ABNORMAL HIGH (ref 15–41)
BILIRUBIN TOTAL: 0.5 mg/dL (ref 0.3–1.2)
BUN: 7 mg/dL (ref 6–20)
CALCIUM: 8.7 mg/dL — AB (ref 8.9–10.3)
CO2: 23 mmol/L (ref 22–32)
Chloride: 105 mmol/L (ref 101–111)
Creatinine, Ser: 0.81 mg/dL (ref 0.61–1.24)
GFR calc Af Amer: >60 mL/min (ref >60)
GFR calc non Af Amer: >60 mL/min (ref >60)
GLUCOSE: 86 mg/dL (ref 65–99)
POTASSIUM: 3.9 mmol/L (ref 3.5–5.1)
Sodium: 141 mmol/L (ref 135–145)
Total Protein: 7.8 g/dL (ref 6.5–8.1)

## 2017-06-07 LAB — CBC
HEMATOCRIT: 31 % — AB (ref 39.0–52.0)
Hemoglobin: 9.4 g/dL — ABNORMAL LOW (ref 13.0–17.0)
MCH: 21.6 pg — ABNORMAL LOW (ref 26.0–34.0)
MCHC: 30.3 g/dL (ref 30.0–36.0)
MCV: 71.3 fL — AB (ref 78.0–100.0)
PLATELETS: 82 10*3/uL — AB (ref 150–400)
RBC: 4.35 MIL/uL (ref 4.22–5.81)
RDW: 18.8 % — AB (ref 11.5–15.5)
WBC: 3.6 10*3/uL — AB (ref 4.0–10.5)

## 2017-06-07 LAB — RAPID URINE DRUG SCREEN, HOSP PERFORMED
Amphetamines: NOT DETECTED
BARBITURATES: NOT DETECTED
Benzodiazepines: NOT DETECTED
Cocaine: NOT DETECTED
Opiates: NOT DETECTED
Tetrahydrocannabinol: NOT DETECTED

## 2017-06-07 LAB — ETHANOL: ALCOHOL ETHYL (B): 339 mg/dL — AB (ref <10)

## 2017-06-07 LAB — ACETAMINOPHEN LEVEL

## 2017-06-07 LAB — SALICYLATE LEVEL: Salicylate Lvl: <7 mg/dL (ref 2.8–30.0)

## 2017-06-07 MED ORDER — NICOTINE 21 MG/24HR TD PT24: 21.0000 mg | MEDICATED_PATCH | Freq: Every day | TRANSDERMAL | Status: DC

## 2017-06-07 MED ORDER — ONDANSETRON HCL 4 MG PO TABS: 4.0000 mg | ORAL_TABLET | Freq: Three times a day (TID) | ORAL | Status: DC | PRN

## 2017-06-07 MED ORDER — VITAMIN B-1 100 MG PO TABS: 100.0000 mg | ORAL_TABLET | Freq: Every day | ORAL | Status: DC

## 2017-06-07 MED ORDER — LORAZEPAM 1 MG PO TABS
0.0000 mg | ORAL_TABLET | Freq: Four times a day (QID) | ORAL | Status: DC
  Administered 2017-06-07: 1 mg via ORAL
  Filled 2017-06-07: qty 1

## 2017-06-07 MED ORDER — LORAZEPAM 1 MG PO TABS: 0.0000 mg | ORAL_TABLET | Freq: Two times a day (BID) | ORAL | Status: DC

## 2017-06-07 MED ORDER — THIAMINE HCL 100 MG/ML IJ SOLN: 100.0000 mg | Freq: Every day | INTRAMUSCULAR | Status: DC

## 2017-06-07 MED ORDER — LORAZEPAM 2 MG/ML IJ SOLN: 0.0000 mg | Freq: Two times a day (BID) | INTRAMUSCULAR | Status: DC

## 2017-06-07 MED ORDER — LORAZEPAM 2 MG/ML IJ SOLN: 0.0000 mg | Freq: Four times a day (QID) | INTRAMUSCULAR | Status: DC

## 2017-06-07 NOTE — [facility] Suicide Risk Assessment (Signed)
Suicide Risk Assessment  Discharge Assessment   [facility] Discharge Suicide Risk Assessment   Principal Problem: Alcohol dependence with alcohol-induced mood disorder [facility]) Discharge Diagnoses:  Patient Active Problem List   Diagnosis Date Noted  . Severe recurrent major depression without psychotic features ([place]) [F33.2] 05/15/2017  . Anemia [D64.9] 03/18/2017  . Acute blood loss anemia [D62] 02/21/2017  . UGI bleed [K92.2] 02/08/2017  . Alcohol withdrawal ([place]) [F10.239] 02/08/2017  . Cocaine abuse ([place]) [F14.10] 11/18/2016  . GI bleed [K92.2] 11/01/2016  . Alcohol abuse [F10.10]   . Major depressive disorder, recurrent severe without psychotic features ([place]) [F33.2] 07/06/2016  . Alcohol-induced mood disorder ([place]) [F10.94] 06/15/2016  . Seizure ([place]) [R56.9] 05/26/2016  . Tobacco abuse [Z72.0] 05/26/2016  . Homeless [Z59.0] 05/26/2016  . Essential hypertension [I10] 05/26/2016  . Alcohol dependence with alcohol-induced mood disorder ([place]) [F10.24] 02/14/2016  . Severe alcohol withdrawal without perceptual disturbances without complication ([place]) [A63.016] 02/14/2016  . Alcoholic hepatitis without ascites [K70.10] 02/14/2016  . Thrombocytopenia ([place]) [D69.6] 02/14/2016   Pt was seen and chart reviewed with treatment team. Pt has presented to the [facility] 24 times in the past 6 months for the same reason. He has a history of alcohol abuse and homelessness. Pt has a high degree of secondary gain by seeking shelter and food in the emergency room. Pt has a peer support specialist with [facility] and he has been instructed to contact her for help with housing. Pt is psychiatrically clear for discharge.   Total Time spent with patient: 30 minutes  Musculoskeletal: Strength & Muscle Tone: within normal limits Gait & Station: normal Patient leans: N/A  Psychiatric Specialty Exam:   Blood pressure (!) 144/77, pulse 93, temperature 98.4 F (36.9 C), temperature source Oral, resp. rate 18, SpO2 98  %.There is no height or weight on file to calculate BMI.  General Appearance: Disheveled  Eye Contact::  Good  Speech:  Clear and [id number]  Volume:  Normal  Mood:  Depressed  Affect:  Congruent and Depressed  Thought Process:  Coherent, Goal Directed and Linear  Orientation:  Full (Time, Place, and Person)  Thought Content:  Logical  Suicidal Thoughts:  No  Homicidal Thoughts:  No  Memory:  Immediate;   Good Recent;   Good Remote;   Fair  Judgement:  Poor  Insight:  Lacking  Psychomotor Activity:  Normal  Concentration:  Good  Recall:  Good  Fund of Knowledge:Good  Language: Good  Akathisia:  No  Handed:  Right  AIMS (if indicated):     Assets:  Communication Skills  Sleep:     Cognition: WNL  ADL's:  Intact   Mental Status Per Nursing Assessment::   On Admission:     Demographic Factors:  Male  Loss Factors: Financial problems/change in socioeconomic status  Historical Factors: Impulsivity  Risk Reduction Factors:   Sense of responsibility to family  Continued Clinical Symptoms:  Depression:   Comorbid alcohol abuse/dependence Impulsivity Alcohol/Substance Abuse/Dependencies  Cognitive Features That Contribute To Risk:  Closed-mindedness    Suicide Risk:  Minimal: No identifiable suicidal ideation.  Patients presenting with no risk factors but with morbid ruminations; may be classified as minimal risk based on the severity of the depressive symptoms  Follow-up Information    [facility]. Go to.   Specialty:  Behavioral Health Why:  Please go to [facility] for medication management. Contact information: [place] [address] [phone]        [facility] Peer Support Specialist)  Follow up.   Why:  Please contact Glennis Brink, your peer support specialist with [facility], for assistance with self-sufficiency.  Contact information: [address] [place], [place] [address] [phone]          Plan Of Care/Follow-up  recommendations:  Activity:  as tolerated Diet:  Heart Healthy  Ethelene Hal, NP 06/07/2017, 1:03 PM

## 2017-06-07 NOTE — BH Assessment (Addendum)
Assessment Note  Barry Moore is a 52 y.o. male who presents to [facility] due to feeling "like I was going to hurt myself". Pt reports he feels the same way today. Pt reports having these thoughts for @ a month. Pt acknowledges that his daily alcohol use probably "didn't help it". Pt reports his main stressor as being homeless, which he has been for several years now. Pt indicates that he's "just tired and fed up". Pt denies any suicidal plans, just passive thoughts ("I just don't wanna be here).   Pt was at [place] for treatment in February and d/c on 05/19/2017 w/ a recommendation to f/u with [facility] for med mgmt. Pt reports that he missed the appt b/c he forgot about it. Pt also reports occasional AH of his name being called. No HI. Pt is unsure what he needs or what will help him.  Pt has a Midwife peer support specialist through [facility], who helps homeless residents with mental health issues to obtain self-sufficiency. Her name is Glennis Brink. Pt reports that she doesn't appear to be doing what she is supposed to do so he doesn't feel that he needs to go out of his way to meet with her. He gave clinician permission to contact Ms. Margorie John.   Clinician contacted Ms. Margorie John [phone]) and left a message, requesting a call back.   Pt recommended for d/c. [place] notified.   Diagnosis: Alcohol Use d/o, severe; MDD, recurrent episode, moderate  Past Medical History:  Past Medical History:  Diagnosis Date  . Alcoholic hepatitis   . Depression   . ETOH abuse   . Hypertension   . Pancreatitis   . Seizures ([place])     Past Surgical History:  Procedure Laterality Date  . ESOPHAGOGASTRODUODENOSCOPY (EGD) WITH PROPOFOL N/A 11/02/2016   Procedure: ESOPHAGOGASTRODUODENOSCOPY (EGD) WITH PROPOFOL;  Surgeon: Carol Ada, MD;  Location[facility];  Service: Endoscopy;  Laterality: N/A;  . HERNIA REPAIR    . LEG SURGERY      Family History:  Family History  Problem Relation Age of Onset  .  Diabetes Mother   . Alcoholism Mother   . Emphysema Father   . Lung cancer Father   . Alcoholism Father     Social History:  reports that he has been smoking cigarettes.  He has been smoking about 1.00 pack per day. he has never used smokeless tobacco. He reports that he drinks alcohol. He reports that he uses drugs. Drug: Cocaine. Frequency: 3.00 times per week.  Additional Social History:  Alcohol / Drug Use Pain Medications: denies Prescriptions: denies Over the Counter: denies History of alcohol / drug use?: Yes Longest period of sobriety (when/how long): Four years when in prison Negative Consequences of Use: Personal relationships, Museum/gallery curator, Work / School Withdrawal Symptoms: Nausea / Vomiting, Tremors, Patient aware of relationship between substance abuse and physical/medical complications, Seizures, Sweats Onset of Seizures: ETOH withdrawal Substance #1 Name of Substance 1: Alcohol 1 - Age of First Use: in teens 1 - Frequency: daily 1 - Duration: ongoing 1 - Last Use / Amount: 06/06/2017  CIWA: CIWA-Ar BP: 138/77 Pulse Rate: 100 Nausea and Vomiting: no nausea and no vomiting Tactile Disturbances: very mild itching, pins and needles, burning or numbness Tremor: not visible, but can be felt fingertip to fingertip Auditory Disturbances: not present Paroxysmal Sweats: barely perceptible sweating, palms moist Visual Disturbances: not present Anxiety: mildly anxious Headache, Fullness in Head: none present Agitation: somewhat more than normal activity Orientation and  Clouding of Sensorium: oriented and can do serial additions CIWA-Ar Total: 5 COWS:    Allergies:  Allergies  Allergen Reactions  . Tomato Shortness Of Breath and Nausea And Vomiting  . Aspirin Nausea And Vomiting    Home Medications:  (Not in a hospital admission)  OB/GYN Status:  No LMP for male patient.  General Assessment Data [facility]: [facility] TTS Assessment: In system Is this a  Tele or Face-to-Face Assessment?: Face-to-Face Is this an Initial Assessment or a Re-assessment for this encounter?: Initial Assessment Marital status: Single Living Arrangements: Other (Comment)(Homeless) Can pt return to current living arrangement?: Yes Admission Status: Voluntary Is patient capable of signing voluntary admission?: Yes Referral Source: Self/Family/Friend     Crisis Care Plan Living Arrangements: Other (Comment)(Homeless) Name of Psychiatrist: none Name of Therapist: none  Education Status Is patient currently in school?: No Is the patient employed, unemployed or receiving disability?: Unemployed  Risk to self with the past 6 months Suicidal Ideation: Yes-Currently Present Has patient been a risk to self within the past 6 months prior to admission? : No Suicidal Intent: No Has patient had any suicidal intent within the past 6 months prior to admission? : No Is patient at risk for suicide?: No Suicidal Plan?: No Has patient had any suicidal plan within the past 6 months prior to admission? : No Access to Means: No Previous Attempts/Gestures: Yes Triggers for Past Attempts: Unknown Intentional Self Injurious Behavior: None Family Suicide History: Unknown Recent stressful life event(s): Other (Comment)(homelessness) Persecutory voices/beliefs?: No Depression: Yes Depression Symptoms: Fatigue, Loss of interest in usual pleasures, Feeling worthless/self pity, Feeling angry/irritable Substance abuse history and/or treatment for substance abuse?: Yes Suicide prevention information given to non-admitted patients: Yes  Risk to Others within the past 6 months Homicidal Ideation: No Does patient have any lifetime risk of violence toward others beyond the six months prior to admission? : No Thoughts of Harm to Others: No Current Homicidal Intent: No Current Homicidal Plan: No Access to Homicidal Means: No History of harm to others?: No Assessment of Violence:  None Noted Does patient have access to weapons?: No Criminal Charges Pending?: No Does patient have a court date: No Is patient on probation?: No  Psychosis Hallucinations: Auditory Delusions: None noted  Mental Status Report Appearance/Hygiene: Disheveled Eye Contact: Fair Motor Activity: Unremarkable Speech: Logical/coherent Level of Consciousness: Quiet/awake, Drowsy Mood: Depressed, Helpless Affect: Appropriate to circumstance Anxiety Level: Minimal Thought Processes: Coherent, Relevant Judgement: Partial Orientation: Person, Place, Situation, Time Obsessive Compulsive Thoughts/Behaviors: None  Cognitive Functioning Concentration: Normal Memory: Recent Intact, Remote Intact Is patient IDD: No Is patient DD?: No Insight: Fair Impulse Control: Fair Appetite: Fair Have you had any weight changes? : No Change Sleep: No Change Vegetative Symptoms: None  ADLScreening [facility] Assessment Services) Patient's cognitive ability adequate to safely complete daily activities?: Yes Patient able to express need for assistance with ADLs?: Yes Independently performs ADLs?: Yes (appropriate for developmental age)  Prior Inpatient Therapy Prior Inpatient Therapy: Yes Prior Therapy Dates: February 2019. Prior Therapy Facilty/Provider(s): [facility] Reason for Treatment: Alcoholism, depression  Prior Outpatient Therapy Prior Outpatient Therapy: No Does patient have an ACCT team?: No Does patient have Intensive In-House Services?  : No Does patient have [facility] services? : No Does patient have P4CC services?: No  ADL Screening (condition at time of admission) Patient's cognitive ability adequate to safely complete daily activities?: Yes Is the patient deaf or have difficulty hearing?: No Does the patient have difficulty seeing, even  when wearing glasses/contacts?: No Does the patient have difficulty concentrating, remembering, or making decisions?: No Patient able to express need  for assistance with ADLs?: Yes Does the patient have difficulty dressing or bathing?: No Independently performs ADLs?: Yes (appropriate for developmental age) Does the patient have difficulty walking or climbing stairs?: No Weakness of Legs: None Weakness of Arms/Hands: None  Home Assistive Devices/Equipment Home Assistive Devices/Equipment: None  Therapy Consults (therapy consults require a physician order) PT Evaluation Needed: No OT Evalulation Needed: No SLP Evaluation Needed: No Abuse/Neglect Assessment (Assessment to be complete while patient is alone) Physical Abuse: Denies(Pt denies. ) Verbal Abuse: Denies(Pt denies. ) Sexual Abuse: Denies(Pt denies. ) Exploitation of patient/patient's resources: Denies(Pt denies. ) Self-Neglect: Denies(Pt denies. )     Advance Directives (For Healthcare) Does Patient Have a Medical Advance Directive?: No Would patient like information on creating a medical advance directive?: No - Patient declined Nutrition Screen- [facility] Adult/[facility] Patient's home diet: Regular  Additional Information 1:1 In Past 12 Months?: No CIRT Risk: No Elopement Risk: No Does patient have medical clearance?: Yes     Disposition:  Disposition Initial Assessment Completed for this Encounter: Yes(consulted with Dr. Mariea Clonts)  On Site Evaluation by:   Reviewed with Physician:    Rexene Edison 06/07/2017 10:27 AM

## 2017-06-11 ENCOUNTER — Emergency Department (HOSPITAL_COMMUNITY)
Admission: EM | Admit: 2017-06-11 | Discharge: 2017-06-12 | Disposition: A | Discharge status: Home / Self Care | Payer: Self-pay | Attending: Emergency Medicine | Admitting: Emergency Medicine

## 2017-06-11 ENCOUNTER — Encounter (HOSPITAL_COMMUNITY): Payer: Self-pay

## 2017-06-11 DIAGNOSIS — M79604 Pain in right leg: Secondary | ICD-10-CM | POA: Insufficient documentation

## 2017-06-11 DIAGNOSIS — Z5321 Procedure and treatment not carried out due to patient leaving prior to being seen by health care provider: Secondary | ICD-10-CM | POA: Insufficient documentation

## 2017-06-11 NOTE — ED Triage Notes (Signed)
Pt called EMS for leg pain, pt is intoxicated

## 2017-06-11 NOTE — ED Notes (Signed)
Pt is alert to pain, and he is sleeping on the couch in the lobby. Un-rousable to get up to get vital signs.

## 2017-06-12 NOTE — ED Triage Notes (Signed)
Pt is asleep in the lobby

## 2017-06-17 ENCOUNTER — Other Ambulatory Visit: Payer: Self-pay

## 2017-06-17 ENCOUNTER — Emergency Department (HOSPITAL_COMMUNITY)
Admission: EM | Admit: 2017-06-17 | Discharge: 2017-06-19 | Disposition: A | Discharge status: Home / Self Care | Payer: Self-pay | Attending: Emergency Medicine | Admitting: Emergency Medicine

## 2017-06-17 DIAGNOSIS — F17210 Nicotine dependence, cigarettes, uncomplicated: Secondary | ICD-10-CM | POA: Insufficient documentation

## 2017-06-17 DIAGNOSIS — F332 Major depressive disorder, recurrent severe without psychotic features: Secondary | ICD-10-CM | POA: Insufficient documentation

## 2017-06-17 DIAGNOSIS — R45851 Suicidal ideations: Secondary | ICD-10-CM | POA: Insufficient documentation

## 2017-06-17 DIAGNOSIS — I10 Essential (primary) hypertension: Secondary | ICD-10-CM | POA: Insufficient documentation

## 2017-06-17 DIAGNOSIS — F10220 Alcohol dependence with intoxication, uncomplicated: Secondary | ICD-10-CM | POA: Insufficient documentation

## 2017-06-17 DIAGNOSIS — Y908 Blood alcohol level of 240 mg/100 ml or more: Secondary | ICD-10-CM | POA: Insufficient documentation

## 2017-06-17 DIAGNOSIS — Z79899 Other long term (current) drug therapy: Secondary | ICD-10-CM | POA: Insufficient documentation

## 2017-06-17 DIAGNOSIS — F10920 Alcohol use, unspecified with intoxication, uncomplicated: Secondary | ICD-10-CM

## 2017-06-17 DIAGNOSIS — F1024 Alcohol dependence with alcohol-induced mood disorder: Secondary | ICD-10-CM | POA: Diagnosis present

## 2017-06-17 LAB — COMPREHENSIVE METABOLIC PANEL
ALBUMIN: 4.2 g/dL (ref 3.5–5.0)
ALT: 48 U/L (ref 17–63)
AST: 127 U/L — AB (ref 15–41)
Alkaline Phosphatase: 113 U/L (ref 38–126)
Anion gap: 12 (ref 5–15)
BUN: 6 mg/dL (ref 6–20)
CHLORIDE: 108 mmol/L (ref 101–111)
CO2: 21 mmol/L — ABNORMAL LOW (ref 22–32)
Calcium: 8.7 mg/dL — ABNORMAL LOW (ref 8.9–10.3)
Creatinine, Ser: 0.79 mg/dL (ref 0.61–1.24)
GFR calc Af Amer: >60 mL/min (ref >60)
GFR calc non Af Amer: >60 mL/min (ref >60)
GLUCOSE: 110 mg/dL — AB (ref 65–99)
POTASSIUM: 3.5 mmol/L (ref 3.5–5.1)
Sodium: 141 mmol/L (ref 135–145)
Total Bilirubin: 0.3 mg/dL (ref 0.3–1.2)
Total Protein: 8 g/dL (ref 6.5–8.1)

## 2017-06-17 LAB — CBC
HEMATOCRIT: 30.2 % — AB (ref 39.0–52.0)
Hemoglobin: 9.2 g/dL — ABNORMAL LOW (ref 13.0–17.0)
MCH: 22.2 pg — ABNORMAL LOW (ref 26.0–34.0)
MCHC: 30.5 g/dL (ref 30.0–36.0)
MCV: 72.9 fL — AB (ref 78.0–100.0)
PLATELETS: 128 10*3/uL — AB (ref 150–400)
RBC: 4.14 MIL/uL — AB (ref 4.22–5.81)
RDW: 19.9 % — ABNORMAL HIGH (ref 11.5–15.5)
WBC: 3.7 10*3/uL — AB (ref 4.0–10.5)

## 2017-06-17 LAB — RAPID URINE DRUG SCREEN, HOSP PERFORMED
AMPHETAMINES: NOT DETECTED
BARBITURATES: NOT DETECTED
BENZODIAZEPINES: NOT DETECTED
Cocaine: NOT DETECTED
Opiates: NOT DETECTED
Tetrahydrocannabinol: NOT DETECTED

## 2017-06-17 LAB — ETHANOL: Alcohol, Ethyl (B): 425 mg/dL (ref <10)

## 2017-06-17 LAB — SALICYLATE LEVEL: Salicylate Lvl: <7 mg/dL (ref 2.8–30.0)

## 2017-06-17 LAB — ACETAMINOPHEN LEVEL: Acetaminophen (Tylenol), Serum: <10 ug/mL — ABNORMAL LOW (ref 10–30)

## 2017-06-17 MED ORDER — LORAZEPAM 2 MG/ML IJ SOLN: 0.0000 mg | Freq: Four times a day (QID) | INTRAMUSCULAR | Status: DC

## 2017-06-17 MED ORDER — ONDANSETRON HCL 4 MG PO TABS: 4.0000 mg | ORAL_TABLET | Freq: Three times a day (TID) | ORAL | Status: DC | PRN

## 2017-06-17 MED ORDER — NICOTINE 21 MG/24HR TD PT24
21.0000 mg | MEDICATED_PATCH | Freq: Every day | TRANSDERMAL | Status: DC
  Administered 2017-06-17 – 2017-06-19 (×3): 21 mg via TRANSDERMAL
  Filled 2017-06-17 (×3): qty 1

## 2017-06-17 MED ORDER — THIAMINE HCL 100 MG/ML IJ SOLN: 100.0000 mg | Freq: Every day | INTRAMUSCULAR | Status: DC

## 2017-06-17 MED ORDER — LORAZEPAM 1 MG PO TABS: 0.0000 mg | ORAL_TABLET | Freq: Two times a day (BID) | ORAL | Status: DC

## 2017-06-17 MED ORDER — ALUM & MAG HYDROXIDE-SIMETH 200-200-20 MG/5ML PO SUSP: 30.0000 mL | Freq: Four times a day (QID) | ORAL | Status: DC | PRN

## 2017-06-17 MED ORDER — LORAZEPAM 1 MG PO TABS
0.0000 mg | ORAL_TABLET | Freq: Four times a day (QID) | ORAL | Status: DC
  Administered 2017-06-17 – 2017-06-18 (×4): 2 mg via ORAL
  Administered 2017-06-19: 1 mg via ORAL
  Filled 2017-06-17: qty 2
  Filled 2017-06-17: qty 1
  Filled 2017-06-17 (×3): qty 2

## 2017-06-17 MED ORDER — LORAZEPAM 2 MG/ML IJ SOLN: 0.0000 mg | Freq: Two times a day (BID) | INTRAMUSCULAR | Status: DC

## 2017-06-17 MED ORDER — VITAMIN B-1 100 MG PO TABS
100.0000 mg | ORAL_TABLET | Freq: Every day | ORAL | Status: DC
  Administered 2017-06-17 – 2017-06-19 (×3): 100 mg via ORAL
  Filled 2017-06-17 (×3): qty 1

## 2017-06-17 NOTE — ED Notes (Addendum)
Date and time results received: 06/17/17 2141 (use smartphrase ".now" to insert current time)  Test: ETOH Critical Value: 425  Name of Provider Notified:  Elmyra Ricks PA  Orders Received? Or Actions Taken?: Orders Received - See Orders for details

## 2017-06-17 NOTE — ED Notes (Signed)
Pt's knife given to security with pt sticker.

## 2017-06-17 NOTE — ED Triage Notes (Signed)
Pt here with police. Pt reports he wants to kill himself.

## 2017-06-17 NOTE — ED Notes (Signed)
Bed: [facility] Expected date:  Expected time:  Means of arrival:  Comments: 

## 2017-06-17 NOTE — BHH Counselor (Signed)
Clinician noted pt's BAL was 425 at 2052. Per Shana Chute, pt is not medically cleared. Clinician spoke to [place], [place] and expressed TTS consult is cannot be completed until pt's BAL is lower as his TTS consult will be inaccurate.   Per Elmyra Ricks, [place] have pt assessed by TTS during day shift as the pt will be sober and alert. Per chart, pt has 24 ED visits in the past six months.   Updated disposition discussed with Penelope Galas.   Vertell Novak, MS, [facility], [facility] Triage Specialist [phone]

## 2017-06-17 NOTE — ED Provider Notes (Signed)
[facility] DEPT Provider Note   CSN: [id number] Arrival date & time: 06/17/17  1932     History   Chief Complaint Chief Complaint  Patient presents with  . Suicidal    HPI   Blood pressure 111/68, pulse (!) 103, temperature 98.6 F (37 C), temperature source Oral, resp. rate 18, height 5\' 7"  (1.702 m), weight 86.2 kg (190 lb), SpO2 96 %.  Barry Moore is a 52 y.o. male brought in with police for suicidal ideation, he has a plan to overdose or take a knife and slit his wrists.  He states he has a prior suicide attempt and that he tried to overdose in the past.  He also is planning to drink himself to death.  He is endorsing auditory command hallucinations to kill himself.  No homicidal ideation, no other drug abuse.  No pain.  Patient is noncompliant with all of his medications, has history of seizure disorder, unclear whether it is secondary to alcohol withdrawal.   Past Medical History:  Diagnosis Date  . Alcoholic hepatitis   . Depression   . ETOH abuse   . Hypertension   . Pancreatitis   . Seizures [facility])     Patient Active Problem List   Diagnosis Date Noted  . Severe recurrent major depression without psychotic features ([place]) 05/15/2017  . Anemia 03/18/2017  . Acute blood loss anemia 02/21/2017  . UGI bleed 02/08/2017  . Alcohol withdrawal ([place]) 02/08/2017  . Cocaine abuse ([place]) 11/18/2016  . GI bleed 11/01/2016  . Alcohol abuse   . Major depressive disorder, recurrent severe without psychotic features ([place]) 07/06/2016  . 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 Seizure ([place]) 05/26/2016  . Tobacco abuse 05/26/2016  . Homeless 05/26/2016  . Essential hypertension 05/26/2016  . Alcohol dependence with alcohol-induced mood disorder ([place]) 02/14/2016  . Severe alcohol withdrawal without perceptual disturbances without complication ([place]) 35/45/6256  . Alcoholic hepatitis without ascites 02/14/2016  . Thrombocytopenia ([place])  02/14/2016    Past Surgical History:  Procedure Laterality Date  . ESOPHAGOGASTRODUODENOSCOPY (EGD) WITH PROPOFOL N/A 11/02/2016   Procedure: ESOPHAGOGASTRODUODENOSCOPY (EGD) WITH PROPOFOL;  Surgeon: Carol Ada, MD;  [facility];  Service: Endoscopy;  Laterality: N/A;  . HERNIA REPAIR    . LEG SURGERY          Home Medications    Prior to Admission medications   Medication Sig Start Date End Date Taking? Authorizing Provider  citalopram (CELEXA) 20 MG tablet Take 1 tablet (20 mg total) by mouth daily. For mood control Patient not taking: Reported on 06/02/2017 05/20/17   Money, Lowry Ram, FNP  gabapentin (NEURONTIN) 300 MG capsule Take 1 capsule (300 mg total) by mouth 3 (three) times daily. for withdrawal symptoms Patient not taking: Reported on 06/02/2017 05/19/17   Money, Lowry Ram, FNP  lisinopril (PRINIVIL,ZESTRIL) 10 MG tablet Take 1 tablet (10 mg total) by mouth daily. Patient not taking: Reported on 06/02/2017 05/20/17   Money, Lowry Ram, FNP  traZODone (DESYREL) 100 MG tablet Take 1 tablet (100 mg total) by mouth at bedtime as needed for sleep. Patient not taking: Reported on 06/02/2017 05/19/17   Money, Lowry Ram, FNP    Family History Family History  Problem Relation Age of Onset  . Diabetes Mother   . Alcoholism Mother   . Emphysema Father   . Lung cancer Father   . Alcoholism Father     Social History Social History   Tobacco Use  . Smoking status:  Current Every Day Smoker    Packs/day: 1.00    Types: Cigarettes  . Smokeless tobacco: Never Used  Substance Use Topics  . Alcohol use: Yes    Comment: 4-5 40oz beers daily  . Drug use: Yes    Frequency: 3.0 times per week    Types: Cocaine    Comment: last use today 2 weeks ago (04-09-17)      Allergies   Tomato and Aspirin   Review of Systems Review of Systems  A complete review of systems was obtained and all systems are negative except as noted in the HPI and PMH.   Physical Exam Updated  Vital Signs BP 111/68 (BP Location: Left Arm)   Pulse (!) 103   Temp 98.6 F (37 C) (Oral)   Resp 18   Ht 5\' 7"  (1.702 m)   Wt 86.2 kg (190 lb)   SpO2 96%   BMI 29.76 kg/m   Physical Exam  Constitutional: He is oriented to person, place, and time. He appears well-developed and well-nourished. No distress.  HENT:  Head: Normocephalic and atraumatic.  Mouth/Throat: Oropharynx is clear and moist.  Eyes: Pupils are equal, round, and reactive to light. Conjunctivae and EOM are normal.  Neck: Normal range of motion.  Cardiovascular: Normal rate, regular rhythm and intact distal pulses.  Pulmonary/Chest: Effort normal and breath sounds normal. No stridor. No respiratory distress. He has no wheezes. He has no rales. He exhibits no tenderness.  Abdominal: Soft. He exhibits no distension and no mass. There is no tenderness. There is no rebound and no guarding. No hernia.  Musculoskeletal: Normal range of motion.  Neurological: He is alert and oriented to person, place, and time.  Skin: He is not diaphoretic.  Psychiatric:  Slurred speech, grossly intoxicated  Nursing note and vitals reviewed.    ED Treatments / Results  Labs (all labs ordered are listed, but only abnormal results are displayed) Labs Reviewed  COMPREHENSIVE METABOLIC PANEL - Abnormal; Notable for the following components:      Result Value   CO2 21 (*)    Glucose, Bld 110 (*)    Calcium 8.7 (*)    AST 127 (*)    All other components within normal limits  ETHANOL - Abnormal; Notable for the following components:   Alcohol, Ethyl (B) 425 (*)    All other components within normal limits  ACETAMINOPHEN LEVEL - Abnormal; Notable for the following components:   Acetaminophen (Tylenol), Serum <10 (*)    All other components within normal limits  CBC - Abnormal; Notable for the following components:   WBC 3.7 (*)    RBC 4.14 (*)    Hemoglobin 9.2 (*)    HCT 30.2 (*)    MCV 72.9 (*)    MCH 22.2 (*)    RDW 19.9 (*)     Platelets 128 (*)    All other components within normal limits  SALICYLATE LEVEL  RAPID URINE DRUG SCREEN, HOSP PERFORMED    EKG EKG Interpretation  Date/Time:  Friday June 17 2017 21:42:36 EDT Ventricular Rate:  97 PR Interval:    QRS Duration: 98 QT Interval:  335 QTC Calculation: 426 R Axis:   87 Text Interpretation:  Sinus rhythm No significant change since last tracing Confirmed by Dorie Rank [phone]) on 06/17/2017 10:04:36 PM   Radiology No results found.  Procedures Procedures (including critical care time)  Medications Ordered in ED Medications  LORazepam (ATIVAN) injection 0-4 mg (has no administration in time  range)    Or  LORazepam (ATIVAN) tablet 0-4 mg (has no administration in time range)  LORazepam (ATIVAN) injection 0-4 mg (has no administration in time range)    Or  LORazepam (ATIVAN) tablet 0-4 mg (has no administration in time range)  thiamine (VITAMIN B-1) tablet 100 mg (has no administration in time range)    Or  thiamine (B-1) injection 100 mg (has no administration in time range)  nicotine (NICODERM CQ - dosed in mg/24 hours) patch 21 mg (has no administration in time range)  alum & mag hydroxide-simeth (MAALOX/MYLANTA) 200-200-20 MG/5ML suspension 30 mL (has no administration in time range)  ondansetron (ZOFRAN) tablet 4 mg (has no administration in time range)     Initial Impression / Assessment and Plan / ED Course  I have reviewed the triage vital signs and the nursing notes.  Pertinent labs & imaging results that were available during my care of the patient were reviewed by me and considered in my medical decision making (see chart for details).     Vitals:   06/17/17 2018 06/17/17 2123  BP:  111/68  Pulse:  (!) 103  Resp:  18  Temp:  98.6 F (37 C)  TempSrc:  Oral  SpO2:  96%  Weight: 86.2 kg (190 lb)   Height: 5\' 7"  (1.702 m)     Medications  LORazepam (ATIVAN) injection 0-4 mg (has no administration in time range)    Or    LORazepam (ATIVAN) tablet 0-4 mg (has no administration in time range)  LORazepam (ATIVAN) injection 0-4 mg (has no administration in time range)    Or  LORazepam (ATIVAN) tablet 0-4 mg (has no administration in time range)  thiamine (VITAMIN B-1) tablet 100 mg (has no administration in time range)    Or  thiamine (B-1) injection 100 mg (has no administration in time range)  nicotine (NICODERM CQ - dosed in mg/24 hours) patch 21 mg (has no administration in time range)  alum & mag hydroxide-simeth (MAALOX/MYLANTA) 200-200-20 MG/5ML suspension 30 mL (has no administration in time range)  ondansetron (ZOFRAN) tablet 4 mg (has no administration in time range)    Barry Moore is 52 y.o. male presenting with suicidal ideation and plan to overdose or slit his wrists.  He has a prior suicide attempt.  Patient grossly intoxicated, alcohol level over 400, cleared for psychiatric evaluation after he sobers.  Patient is medically cleared for psychiatric evaluation will be transferred to the psych ED. TTS consulted, home meds and psych standard holding orders placed.   Discussed with TTS who given his intoxication will evaluate him in the morning.    Final Clinical Impressions(s) / ED Diagnoses   Final diagnoses:  None    ED Discharge Orders    None       Cherokee Clowers, Charna Elizabeth 06/17/17 2209    Dorie Rank, MD 06/17/17 [phone]

## 2017-06-18 NOTE — ED Notes (Signed)
Patient transported to the restroom via wheelchair by sitter.

## 2017-06-18 NOTE — BH Assessment (Signed)
[place] Assessment Progress Note  Case was staffed with Romilda Garret FNP who recommends patient be observed for safety and monitored for withdrawals. Patient will be seen by psychiatry in the a.m.

## 2017-06-18 NOTE — BH Assessment (Addendum)
Assessment Note  Barry Moore is an 52 y.o. male that presents this date highly impaired with a BAL of 452 on admission. Patient cannot initially be assessed 0800 hours due to being impaired. Patient was noted to have visible tremors at the time of assessment at 1600 hours. Patient is unable to render any history due to being partially intoxicated. Patient does nod his head "yes" when asked if he has thoughts of self harm. Patient shakes his head "no" when asked if he had any plan or intent. Information was utilized from prior history and admission notes to complete assessment. Per notes, patient has a history of alcohol abuse, hypertension, pancreatitis who presents to the emergency department with multiple complaints this date. Patient reports a history of being homeless. Per history review patient reports daily alcohol use. Patient reports consuming different amounts daily with last use on 06/18/17. Patient is noted to have a history of seizures. Patient denies any other use of illegal substances. Patient stated earlier this date "I want to die", when asked if he has a plan he does not reply. He admits to drinking a unknown amount this date but reports daily consumption of three or four 40 oz beers along with different amount of liquor. Patient complains of nausea and vomiting on admission. Patient denies any H/I, AVH or drug use. Patient admits to being a cigarette smoker. Patient states he doesn't take any of his medications because he cannot afford them[place] and does not see a primary care doctor. Chart review reveals that he has been seen in the ED several times in the last few months, usually for intoxication with most recent visit on 06/07/17. Case was staffed with Romilda Garret FNP who recommends patient be observed for safety and monitored for withdrawals. Patient will be seen by psychiatry in the a.m.   Diagnosis: F33.2 MDD recurrent severe without psychotic features, Alcohol abuse  Past  Medical History:  Past Medical History:  Diagnosis Date  . Alcoholic hepatitis   . Depression   . ETOH abuse   . Hypertension   . Pancreatitis   . Seizures ([place])     Past Surgical History:  Procedure Laterality Date  . ESOPHAGOGASTRODUODENOSCOPY (EGD) WITH PROPOFOL N/A 11/02/2016   Procedure: ESOPHAGOGASTRODUODENOSCOPY (EGD) WITH PROPOFOL;  Surgeon: Carol Ada, MD;  Location: [facility];  Service: Endoscopy;  Laterality: N/A;  . HERNIA REPAIR    . LEG SURGERY      Family History:  Family History  Problem Relation Age of Onset  . Diabetes Mother   . Alcoholism Mother   . Emphysema Father   . Lung cancer Father   . Alcoholism Father     Social History:  reports that he has been smoking cigarettes.  He has been smoking about 1.00 pack per day. He has never used smokeless tobacco. He reports that he drinks alcohol. He reports that he has current or past drug history. Drug: Cocaine. Frequency: 3.00 times per week.  Additional Social History:  Alcohol / Drug Use Pain Medications: denies Prescriptions: denies Over the Counter: denies History of alcohol / drug use?: Yes Longest period of sobriety (when/how long): Four years when in prison Negative Consequences of Use: Personal relationships, Museum/gallery curator, Work / School Withdrawal Symptoms: Weakness, Agitation, Sweats, Tremors, Nausea / Vomiting, Seizures Onset of Seizures: ETOH withdrawal Date of most recent seizure: 3 days ago  Substance #1 Name of Substance 1: Alcohol 1 - Age of First Use: in teens 1 - Amount (size/oz): Pt  reports "different amounts"  1 - Frequency: daily 1 - Duration: ongoing 1 - Last Use / Amount: 06/18/17 unknown amount Substance #2 Name of Substance 2: Cigarettes.  2 - Age of First Use: UTA 2 - Amount (size/oz): Pt reported, smoking a pack, daily.  2 - Frequency: Daily.  2 - Duration: Ongoing 2 - Last Use / Amount: Daily.   CIWA: CIWA-Ar BP: (!) 156/84 Pulse Rate: 85 Nausea and Vomiting: mild  nausea with no vomiting Tactile Disturbances: very mild itching, pins and needles, burning or numbness Tremor: moderate, with patient's arms extended Auditory Disturbances: not present Paroxysmal Sweats: two Visual Disturbances: not present Anxiety: mildly anxious Headache, Fullness in Head: mild Agitation: somewhat more than normal activity Orientation and Clouding of Sensorium: cannot do serial additions or is uncertain about date CIWA-Ar Total: 13 COWS:    Allergies:  Allergies  Allergen Reactions  . Tomato Shortness Of Breath and Nausea And Vomiting  . Aspirin Nausea And Vomiting    Home Medications:  (Not in a hospital admission)  OB/GYN Status:  No LMP for male patient.  General Assessment Data Assessment unable to be completed: Yes Reason for not completing assessment: Per Elmyra Ricks, PA have pt assessed by TTS during day shift as the pt will be sober and alert. Per chart, pt has 24 ED visits in the past six months. (Clinician noted pt's BAL was 425 at 2052. ) Location of Assessment: [facility] ED TTS Assessment: In system Is this a Tele or Face-to-Face Assessment?: Face-to-Face Is this an Initial Assessment or a Re-assessment for this encounter?: Initial Assessment Marital status: Single Maiden name: NA Is patient pregnant?: No Pregnancy Status: No Living Arrangements: Other (Comment)(Homeless) Can pt return to current living arrangement?: Yes Admission Status: Voluntary Is patient capable of signing voluntary admission?: Yes Referral Source: Self/Family/Friend  Medical Screening Exam ([place]) Medical Exam completed: Yes  Crisis Care Plan Living Arrangements: Other (Comment)(Homeless) Legal Guardian: (NA) Name of Psychiatrist: None Name of Therapist: None  Education Status Is patient currently in school?: No Current Grade: (NA) Highest grade of school patient has completed: (NA) Name of school: (NA) Contact person: (NA) Is the patient employed,  unemployed or receiving disability?: Unemployed  Risk to self with the past 6 months Suicidal Ideation: Yes-Currently Present Has patient been a risk to self within the past 6 months prior to admission? : No Suicidal Intent: No Has patient had any suicidal intent within the past 6 months prior to admission? : No Is patient at risk for suicide?: No Suicidal Plan?: No Has patient had any suicidal plan within the past 6 months prior to admission? : No Specify Current Suicidal Plan: Pt declines to answer Access to Means: (UTA) Specify Access to Suicidal Means: (UTA) What has been your use of drugs/alcohol within the last 12 months?: Current use Previous Attempts/Gestures: Yes How many times?: (Multiple per history) Other Self Harm Risks: (NA) Triggers for Past Attempts: Unknown Intentional Self Injurious Behavior: None Family Suicide History: Unknown Recent stressful life event(s): Other (Comment)(Excessive SA use) Persecutory voices/beliefs?: No Depression: Yes Depression Symptoms: (Pt refuses to answer) Substance abuse history and/or treatment for substance abuse?: Yes Suicide prevention information given to non-admitted patients: Not applicable  Risk to Others within the past 6 months Homicidal Ideation: No Does patient have any lifetime risk of violence toward others beyond the six months prior to admission? : No Thoughts of Harm to Others: No Comment - Thoughts of Harm to Others: NA Current Homicidal Intent: No Current  Homicidal Plan: No Access to Homicidal Means: No Identified Victim: n History of harm to others?: No Assessment of Violence: None Noted Violent Behavior Description: NA Does patient have access to weapons?: No Criminal Charges Pending?: No Does patient have a court date: No Is patient on probation?: No  Psychosis Hallucinations: None noted Delusions: None noted  Mental Status Report Appearance/Hygiene: In scrubs Eye Contact: Poor Motor Activity:  Freedom of movement Speech: Slow, Slurred Level of Consciousness: Drowsy Mood: Depressed Affect: Flat Anxiety Level: None Thought Processes: Unable to Assess Judgement: Impaired Orientation: Unable to assess Obsessive Compulsive Thoughts/Behaviors: Unable to Assess  Cognitive Functioning Concentration: Unable to Assess Memory: Unable to Assess Is patient IDD: No Is patient DD?: No Insight: Unable to Assess Impulse Control: Unable to Assess Appetite: (UTA) Have you had any weight changes? : (UTA) Sleep: (UTA) Total Hours of Sleep: (UTA) Vegetative Symptoms: None  ADLScreening [facility] Assessment Services) Patient's cognitive ability adequate to safely complete daily activities?: Yes Patient able to express need for assistance with ADLs?: Yes Independently performs ADLs?: Yes (appropriate for developmental age)  Prior Inpatient Therapy Prior Inpatient Therapy: Yes Prior Therapy Dates: 2019 Prior Therapy Facilty/Provider(s): [facility] Reason for Treatment: ETOH issues  Prior Outpatient Therapy Prior Outpatient Therapy: No Prior Therapy Dates: NA Prior Therapy Facilty/Provider(s): NA Reason for Treatment: NA Does patient have an ACCT team?: No Does patient have Intensive In-House Services?  : No Does patient have [facility] services? : No Does patient have P4CC services?: No  ADL Screening (condition at time of admission) Patient's cognitive ability adequate to safely complete daily activities?: Yes Is the patient deaf or have difficulty hearing?: No Does the patient have difficulty seeing, even when wearing glasses/contacts?: No Does the patient have difficulty concentrating, remembering, or making decisions?: No Patient able to express need for assistance with ADLs?: Yes Does the patient have difficulty dressing or bathing?: No Independently performs ADLs?: Yes (appropriate for developmental age) Does the patient have difficulty walking or climbing stairs?: No Weakness of Legs:  None Weakness of Arms/Hands: None  Home Assistive Devices/Equipment Home Assistive Devices/Equipment: None  Therapy Consults (therapy consults require a physician order) PT Evaluation Needed: No OT Evalulation Needed: No SLP Evaluation Needed: No Abuse/Neglect Assessment (Assessment to be complete while patient is alone) Physical Abuse: Denies Verbal Abuse: Denies Sexual Abuse: Denies Exploitation of patient/patient's resources: Denies Self-Neglect: Denies Values / Beliefs Cultural Requests During Hospitalization: None Spiritual Requests During Hospitalization: None Consults Spiritual Care Consult Needed: No Social Work Consult Needed: No Regulatory affairs officer (For Healthcare) Does Patient Have a Medical Advance Directive?: No Would patient like information on creating a medical advance directive?: No - Patient declined    Additional Information 1:1 In Past 12 Months?: No CIRT Risk: No Elopement Risk: No     Disposition: Case was staffed with Romilda Garret FNP who recommends patient be observed for safety and monitored for withdrawals. Patient will be seen by psychiatry in the a.m. Disposition Initial Assessment Completed for this Encounter: Yes Disposition of Patient: (Pt will monitored for safety and observation) Patient refused recommended treatment: No Mode of transportation if patient is discharged?: (Unknown)  On Site Evaluation by:   Reviewed with Physician:    Mamie Nick 06/18/2017 4:23 PM

## 2017-06-18 NOTE — ED Notes (Signed)
Pt with noted tremors and goose bumps on his skin, as well as sweating noted to his forehead. Pt unsteady while ambulating and was assisted to the bathroom. Pt given Ativan per protocol. Pt in no obvious distress at this time. Sitter remains at bedside for safety.

## 2017-06-18 NOTE — ED Notes (Signed)
Patient resting in bed with no signs of distress noted. Pt denies suicidal or homicidal ideation at this time. Pt endorsing depression, but brightens on approach. Pt with fair eye contact during assessment. Respirations regular and unlabored; skin warm and dry.

## 2017-06-18 NOTE — ED Notes (Signed)
Bed: LL97 Expected date:  Expected time:  Means of arrival:  Comments: Held for [employer]

## 2017-06-18 NOTE — ED Notes (Signed)
TTS at the bedside. 

## 2017-06-18 NOTE — ED Notes (Signed)
Patient is asleep and resting comfortably.  

## 2017-06-19 DIAGNOSIS — Z811 Family history of alcohol abuse and dependence: Secondary | ICD-10-CM

## 2017-06-19 DIAGNOSIS — F1024 Alcohol dependence with alcohol-induced mood disorder: Secondary | ICD-10-CM

## 2017-06-19 DIAGNOSIS — R45851 Suicidal ideations: Secondary | ICD-10-CM

## 2017-06-19 DIAGNOSIS — F17210 Nicotine dependence, cigarettes, uncomplicated: Secondary | ICD-10-CM

## 2017-06-19 NOTE — Consult Note (Addendum)
[facility] Face-to-Face Psychiatry Consult   Reason for Consult:  Alcohol abuse with suicidal ideations Referring Physician:  EDP Patient Identification: Barry Moore MRN:  [id number] Principal Diagnosis: Alcohol dependence with alcohol-induced mood disorder [facility] Diagnosis:   Patient Active Problem List   Diagnosis Date Noted  . Alcohol dependence with alcohol-induced mood disorder ([place]) [F10.24] 02/14/2016    Priority: High  . Cocaine abuse ([place]) [F14.10] 11/18/2016    Priority: Low  . Severe recurrent major depression without psychotic features ([place]) [F33.2] 05/15/2017  . Anemia [D64.9] 03/18/2017  . Acute blood loss anemia [D62] 02/21/2017  . UGI bleed [K92.2] 02/08/2017  . Alcohol withdrawal ([place]) [F10.239] 02/08/2017  . GI bleed [K92.2] 11/01/2016  . Alcohol abuse [F10.10]   . Major depressive disorder, recurrent severe without psychotic features ([place]) [F33.2] 07/06/2016  . Alcohol-induced mood disorder ([place]) [F10.94] 06/15/2016  . Seizure ([place]) [R56.9] 05/26/2016  . Tobacco abuse [Z72.0] 05/26/2016  . Homeless [Z59.0] 05/26/2016  . Essential hypertension [I10] 05/26/2016  . Severe alcohol withdrawal without perceptual disturbances without complication ([place]) [R48.546] 02/14/2016  . Alcoholic hepatitis without ascites [K70.10] 02/14/2016  . Thrombocytopenia ([place]) [D69.6] 02/14/2016    Total Time spent with patient: 45 minutes  Subjective:   Barry Moore is a 52 y.o. male patient does not warrant admission.  HPI:  52 yo male who presented to the ED with alcohol intoxication and suicidal ideations.  Today, he is clear and coherent with no suicidal/homicidal ideations, hallucinations, and withdrawal symptoms.  Barry Moore is well-known to this ED and providers.  Peer support consult placed.  Past Psychiatric History: alcohol abuse, depression  Risk to Self: NOne  Risk to Others: Homicidal Ideation: No Thoughts of Harm to Others: No Comment - Thoughts of Harm to Others:  NA Current Homicidal Intent: No Current Homicidal Plan: No Access to Homicidal Means: No Identified Victim: n History of harm to others?: No Assessment of Violence: None Noted Violent Behavior Description: NA Does patient have access to weapons?: No Criminal Charges Pending?: No Does patient have a court date: No Prior Inpatient Therapy: Prior Inpatient Therapy: Yes Prior Therapy Dates: 2019 Prior Therapy Facilty/Provider(s): [facility] Reason for Treatment: ETOH issues Prior Outpatient Therapy: Prior Outpatient Therapy: No Prior Therapy Dates: NA Prior Therapy Facilty/Provider(s): NA Reason for Treatment: NA Does patient have an ACCT team?: No Does patient have Intensive In-House Services?  : No Does patient have [facility] services? : No Does patient have P4CC services?: No  Past Medical History:  Past Medical History:  Diagnosis Date  . Alcoholic hepatitis   . Depression   . ETOH abuse   . Hypertension   . Pancreatitis   . Seizures ([place])     Past Surgical History:  Procedure Laterality Date  . ESOPHAGOGASTRODUODENOSCOPY (EGD) WITH PROPOFOL N/A 11/02/2016   Procedure: ESOPHAGOGASTRODUODENOSCOPY (EGD) WITH PROPOFOL;  Surgeon: Carol Ada, MD;  Location: [facility] ENDOSCOPY;  Service: Endoscopy;  Laterality: N/A;  . HERNIA REPAIR    . LEG SURGERY     Family History:  Family History  Problem Relation Age of Onset  . Diabetes Mother   . Alcoholism Mother   . Emphysema Father   . Lung cancer Father   . Alcoholism Father    Family Psychiatric  History: father and mother with alcohol Social History:  Social History   Substance and Sexual Activity  Alcohol Use Yes   Comment: 4-5 40oz beers daily     Social History   Substance and Sexual Activity  Drug Use Yes  . Frequency:  3.0 times per week  . Types: Cocaine   Comment: last use today 2 weeks ago (04-09-17)     Social History   Socioeconomic History  . Marital status: Divorced    Spouse name: Not on file  . Number of  children: Not on file  . Years of education: Not on file  . Highest education level: Not on file  Occupational History  . Not on file  Social Needs  . Financial resource strain: Not on file  . Food insecurity:    Worry: Not on file    Inability: Not on file  . Transportation needs:    Medical: Not on file    Non-medical: Not on file  Tobacco Use  . Smoking status: Current Every Day Smoker    Packs/day: 1.00    Types: Cigarettes  . Smokeless tobacco: Never Used  Substance and Sexual Activity  . Alcohol use: Yes    Comment: 4-5 40oz beers daily  . Drug use: Yes    Frequency: 3.0 times per week    Types: Cocaine    Comment: last use today 2 weeks ago (04-09-17)   . Sexual activity: Not Currently  Lifestyle  . Physical activity:    Days per week: Not on file    Minutes per session: Not on file  . Stress: Not on file  Relationships  . Social connections:    Talks on phone: Not on file    Gets together: Not on file    Attends religious service: Not on file    Active member of club or organization: Not on file    Attends meetings of clubs or organizations: Not on file    Relationship status: Not on file  Other Topics Concern  . Not on file  Social History Narrative  . Not on file   Additional Social History:    Allergies:   Allergies  Allergen Reactions  . Tomato Shortness Of Breath and Nausea And Vomiting  . Aspirin Nausea And Vomiting    Labs:  Results for orders placed or performed during the hospital encounter of 06/17/17 (from the past 48 hour(s))  Rapid urine drug screen (hospital performed)     Status: None   Collection Time: 06/17/17  8:03 PM  Result Value Ref Range   Opiates NONE DETECTED NONE DETECTED   Cocaine NONE DETECTED NONE DETECTED   Benzodiazepines NONE DETECTED NONE DETECTED   Amphetamines NONE DETECTED NONE DETECTED   Tetrahydrocannabinol NONE DETECTED NONE DETECTED   Barbiturates NONE DETECTED NONE DETECTED    Comment: (NOTE) DRUG SCREEN  FOR MEDICAL PURPOSES ONLY.  IF CONFIRMATION IS NEEDED FOR ANY PURPOSE, NOTIFY LAB WITHIN 5 DAYS. LOWEST DETECTABLE LIMITS FOR URINE DRUG SCREEN Drug Class                     Cutoff (ng/mL) Amphetamine and metabolites    1000 Barbiturate and metabolites    200 Benzodiazepine                 132 Tricyclics and metabolites     300 Opiates and metabolites        300 Cocaine and metabolites        300 THC                            50 Performed at [facility], [place] [address], [place], [place] [address]   Comprehensive metabolic panel  Status: Abnormal   Collection Time: 06/17/17  8:52 PM  Result Value Ref Range   Sodium 141 135 - 145 mmol/L   Potassium 3.5 3.5 - 5.1 mmol/L   Chloride 108 101 - 111 mmol/L   CO2 21 (L) 22 - 32 mmol/L   Glucose, Bld 110 (H) 65 - 99 mg/dL   BUN 6 6 - 20 mg/dL   Creatinine, Ser 0.79 0.61 - 1.24 mg/dL   Calcium 8.7 (L) 8.9 - 10.3 mg/dL   Total Protein 8.0 6.5 - 8.1 g/dL   Albumin 4.2 3.5 - 5.0 g/dL   AST 127 (H) 15 - 41 U/L   ALT 48 17 - 63 U/L   Alkaline Phosphatase 113 38 - 126 U/L   Total Bilirubin 0.3 0.3 - 1.2 mg/dL   GFR calc non Af Amer >60 >60 mL/min   GFR calc Af Amer >60 >60 mL/min    Comment: (NOTE) The eGFR has been calculated using the CKD EPI equation. This calculation has not been validated in all clinical situations. eGFR's persistently <60 mL/min signify possible Chronic Kidney Disease.    Anion gap 12 5 - 15    Comment: Performed at [facility], [place] [address], [place], [place] [address]  Ethanol     Status: Abnormal   Collection Time: 06/17/17  8:52 PM  Result Value Ref Range   Alcohol, Ethyl (B) 425 (HH) <10 mg/dL    Comment:        LOWEST DETECTABLE LIMIT FOR SERUM ALCOHOL IS 10 mg/dL FOR MEDICAL PURPOSES ONLY CRITICAL RESULT CALLED TO, READ BACK BY AND VERIFIED WITH: NASH,D AT 2141 ON 06/17/2017 BY MOSLEY,J Performed at [facility], [place] [address],  [place], [place] [address]   Salicylate level     Status: None   Collection Time: 06/17/17  8:52 PM  Result Value Ref Range   Salicylate Lvl <8.4 2.8 - 30.0 mg/dL    Comment: Performed at [facility], [place] [address] [place], [place] [address]  Acetaminophen level     Status: Abnormal   Collection Time: 06/17/17  8:52 PM  Result Value Ref Range   Acetaminophen (Tylenol), Serum <10 (L) 10 - 30 ug/mL    Comment:        THERAPEUTIC CONCENTRATIONS VARY SIGNIFICANTLY. A RANGE OF 10-30 ug/mL MAY BE AN EFFECTIVE CONCENTRATION FOR MANY PATIENTS. HOWEVER, SOME ARE BEST TREATED AT CONCENTRATIONS OUTSIDE THIS RANGE. ACETAMINOPHEN CONCENTRATIONS >150 ug/mL AT 4 HOURS AFTER INGESTION AND >50 ug/mL AT 12 HOURS AFTER INGESTION ARE OFTEN ASSOCIATED WITH TOXIC REACTIONS. Performed at [facility], [place] [address], [place], [place] [address]   cbc     Status: Abnormal   Collection Time: 06/17/17  8:52 PM  Result Value Ref Range   WBC 3.7 (L) 4.0 - 10.5 K/uL   RBC 4.14 (L) 4.22 - 5.81 MIL/uL   Hemoglobin 9.2 (L) 13.0 - 17.0 g/dL   HCT 30.2 (L) 39.0 - 52.0 %   MCV 72.9 (L) 78.0 - 100.0 fL   MCH 22.2 (L) 26.0 - 34.0 pg   MCHC 30.5 30.0 - 36.0 g/dL   RDW 19.9 (H) 11.5 - 15.5 %   Platelets 128 (L) 150 - 400 K/uL    Comment: Performed at [facility], [place] [address] [place], [place] [address]    Current Facility-Administered Medications  Medication Dose Route Frequency Provider Last Rate Last Dose  . alum & mag hydroxide-simeth (MAALOX/MYLANTA) 200-200-20 MG/5ML  suspension 30 mL  30 mL Oral Q6H PRN Pisciotta, Elmyra Ricks, PA-C      . LORazepam (ATIVAN) injection 0-4 mg  0-4 mg Intravenous Q6H Pisciotta, Nicole, PA-C       Or  . LORazepam (ATIVAN) tablet 0-4 mg  0-4 mg Oral Q6H Pisciotta, Nicole, PA-C   1 mg at 06/19/17 2641  . [START ON 06/20/2017] LORazepam (ATIVAN) injection 0-4 mg  0-4 mg Intravenous Q12H Pisciotta, Nicole, PA-C       Or  .  [START ON 06/20/2017] LORazepam (ATIVAN) tablet 0-4 mg  0-4 mg Oral Q12H Pisciotta, Nicole, PA-C      . nicotine (NICODERM CQ - dosed in mg/24 hours) patch 21 mg  21 mg Transdermal Daily Pisciotta, Nicole, PA-C   21 mg at 06/19/17 0953  . ondansetron (ZOFRAN) tablet 4 mg  4 mg Oral Q8H PRN Pisciotta, Nicole, PA-C      . thiamine (VITAMIN B-1) tablet 100 mg  100 mg Oral Daily Pisciotta, Elmyra Ricks, PA-C   100 mg at 06/19/17 5830   Or  . thiamine (B-1) injection 100 mg  100 mg Intravenous Daily Pisciotta, Elmyra Ricks, PA-C       Current Outpatient Medications  Medication Sig Dispense Refill  . citalopram (CELEXA) 20 MG tablet Take 1 tablet (20 mg total) by mouth daily. For mood control (Patient not taking: Reported on 06/02/2017) 30 tablet 0  . gabapentin (NEURONTIN) 300 MG capsule Take 1 capsule (300 mg total) by mouth 3 (three) times daily. for withdrawal symptoms (Patient not taking: Reported on 06/02/2017) 90 capsule 0  . lisinopril (PRINIVIL,ZESTRIL) 10 MG tablet Take 1 tablet (10 mg total) by mouth daily. (Patient not taking: Reported on 06/02/2017) 30 tablet 0  . traZODone (DESYREL) 100 MG tablet Take 1 tablet (100 mg total) by mouth at bedtime as needed for sleep. (Patient not taking: Reported on 06/02/2017) 30 tablet 0    Musculoskeletal: Strength & Muscle Tone: within normal limits Gait & Station: normal Patient leans: N/A  Psychiatric Specialty Exam: Physical Exam  Constitutional: He is oriented to person, place, and time. He appears well-developed and well-nourished.  HENT:  Head: Normocephalic.  Neck: Normal range of motion.  Respiratory: Effort normal.  Musculoskeletal: Normal range of motion.  Neurological: He is alert and oriented to person, place, and time.  Psychiatric: He has a normal mood and affect. His speech is normal and behavior is normal. Judgment and thought content normal. Cognition and memory are normal.    Review of Systems  Psychiatric/Behavioral: Positive for substance  abuse.  All other systems reviewed and are negative.   Blood pressure (!) 168/95, pulse 91, temperature 98.7 F (37.1 C), temperature source Oral, resp. rate 16, height 5' 7" (1.702 m), weight 86.2 kg (190 lb), SpO2 97 %.Body mass index is 29.76 kg/m.  General Appearance: Disheveled  Eye Contact:  Good  Speech:  Normal Rate  Volume:  Normal  Mood:  Euthymic  Affect:  Congruent  Thought Process:  Coherent and Descriptions of Associations: Intact  Orientation:  Full (Time, Place, and Person)  Thought Content:  WDL and Logical  Suicidal Thoughts:  No  Homicidal Thoughts:  No  Memory:  Immediate;   Good Recent;   Good Remote;   Good  Judgement:  Fair  Insight:  Fair  Psychomotor Activity:  Normal  Concentration:  Concentration: Good and Attention Span: Good  Recall:  Good  Fund of Knowledge:  Fair  Language:  Good  Akathisia:  No  Handed:  Right  AIMS (if indicated):     Assets:  Leisure Time Physical Health Resilience  ADL's:  Intact  Cognition:  WNL  Sleep:        Treatment Plan Summary: Daily contact with patient to assess and evaluate symptoms and progress in treatment, Medication management and Plan alcohol abuse with alcohol induced mood disorder:  -Crisis stabilization -Medication management:  Started Ativan alcohol detox protocol -Individual and substance abuse counseling -Peer support consult  Disposition: No evidence of imminent risk to self or others at present.    Waylan Boga, NP 06/19/2017 10:13 AM  Patient seen face-to-face for psychiatric evaluation, chart reviewed and case discussed with the physician extender and developed treatment plan. Reviewed the information documented and agree with the treatment plan. Corena Pilgrim, MD

## 2017-06-19 NOTE — ED Notes (Signed)
Pt awake, ambulated in hallway independently without problems. Pt oriented. Will continue to monitor status, sitter present.

## 2017-06-19 NOTE — ED Notes (Signed)
Pt received his clothes and discharge instructions then left ambulatory.

## 2017-06-19 NOTE — BHH Suicide Risk Assessment (Signed)
Suicide Risk Assessment  Discharge Assessment   [facility] Discharge Suicide Risk Assessment   Principal Problem: Alcohol dependence with alcohol-induced mood disorder [facility]) Discharge Diagnoses:  Patient Active Problem List   Diagnosis Date Noted  . Alcohol dependence with alcohol-induced mood disorder ([place]) [F10.24] 02/14/2016    Priority: High  . Cocaine abuse ([place]) [F14.10] 11/18/2016    Priority: Low  . Severe recurrent major depression without psychotic features ([place]) [F33.2] 05/15/2017  . Anemia [D64.9] 03/18/2017  . Acute blood loss anemia [D62] 02/21/2017  . UGI bleed [K92.2] 02/08/2017  . Alcohol withdrawal ([place]) [F10.239] 02/08/2017  . GI bleed [K92.2] 11/01/2016  . Alcohol abuse [F10.10]   . Major depressive disorder, recurrent severe without psychotic features ([place]) [F33.2] 07/06/2016  . Alcohol-induced mood disorder ([place]) [F10.94] 06/15/2016  . Seizure ([place]) [R56.9] 05/26/2016  . Tobacco abuse [Z72.0] 05/26/2016  . Homeless [Z59.0] 05/26/2016  . Essential hypertension [I10] 05/26/2016  . Severe alcohol withdrawal without perceptual disturbances without complication ([place]) [I95.188] 02/14/2016  . Alcoholic hepatitis without ascites [K70.10] 02/14/2016  . Thrombocytopenia ([place]) [D69.6] 02/14/2016    Total Time spent with patient: 45 minutes  Musculoskeletal: Strength & Muscle Tone: within normal limits Gait & Station: normal Patient leans: N/A  Psychiatric Specialty Exam: Physical Exam  Constitutional: He is oriented to person, place, and time. He appears well-developed and well-nourished.  HENT:  Head: Normocephalic.  Neck: Normal range of motion.  Respiratory: Effort normal.  Musculoskeletal: Normal range of motion.  Neurological: He is alert and oriented to person, place, and time.  Psychiatric: He has a normal mood and affect. His speech is normal and behavior is normal. Judgment and thought content normal. Cognition and memory are normal.    Review of Systems   Psychiatric/Behavioral: Positive for substance abuse.  All other systems reviewed and are negative.   Blood pressure (!) 168/95, pulse 91, temperature 98.7 F (37.1 C), temperature source Oral, resp. rate 16, height 5\' 7"  (1.702 m), weight 86.2 kg (190 lb), SpO2 97 %.Body mass index is 29.76 kg/m.  General Appearance: Disheveled  Eye Contact:  Good  Speech:  Normal Rate  Volume:  Normal  Mood:  Euthymic  Affect:  Congruent  Thought Process:  Coherent and Descriptions of Associations: Intact  Orientation:  Full (Time, Place, and Person)  Thought Content:  WDL and Logical  Suicidal Thoughts:  No  Homicidal Thoughts:  No  Memory:  Immediate;   Good Recent;   Good Remote;   Good  Judgement:  Fair  Insight:  Fair  Psychomotor Activity:  Normal  Concentration:  Concentration: Good and Attention Span: Good  Recall:  Good  Fund of Knowledge:  Fair  Language:  Good  Akathisia:  No  Handed:  Right  AIMS (if indicated):     Assets:  Leisure Time Physical Health Resilience  ADL's:  Intact  Cognition:  WNL  Sleep:       Mental Status Per Nursing Assessment::   On Admission:   alcohol abuse with suicidal ideations  Demographic Factors:  Male and Caucasian  Loss Factors: NA  Historical Factors: NA  Risk Reduction Factors:   Sense of responsibility to family and Positive social support  Continued Clinical Symptoms:  None  Cognitive Features That Contribute To Risk:  None    Suicide Risk:  Minimal: No identifiable suicidal ideation.  Patients presenting with no risk factors but with morbid ruminations; may be classified as minimal risk based on the severity of the depressive symptoms  Plan Of Care/Follow-up recommendations:  Activity:  as tolerated Diet:  heart healthy diet  Katherina Wimer, NP 06/19/2017, 10:22 AM

## 2017-06-24 ENCOUNTER — Other Ambulatory Visit: Payer: Self-pay

## 2017-06-24 ENCOUNTER — Encounter (HOSPITAL_COMMUNITY): Payer: Self-pay

## 2017-06-24 ENCOUNTER — Emergency Department (HOSPITAL_COMMUNITY)
Admission: EM | Admit: 2017-06-24 | Discharge: 2017-06-26 | Disposition: A | Discharge status: Home / Self Care | Payer: Self-pay | Attending: Emergency Medicine | Admitting: Emergency Medicine

## 2017-06-24 DIAGNOSIS — F1024 Alcohol dependence with alcohol-induced mood disorder: Secondary | ICD-10-CM | POA: Insufficient documentation

## 2017-06-24 DIAGNOSIS — Y908 Blood alcohol level of 240 mg/100 ml or more: Secondary | ICD-10-CM | POA: Insufficient documentation

## 2017-06-24 DIAGNOSIS — R45851 Suicidal ideations: Secondary | ICD-10-CM | POA: Insufficient documentation

## 2017-06-24 DIAGNOSIS — F10920 Alcohol use, unspecified with intoxication, uncomplicated: Secondary | ICD-10-CM

## 2017-06-24 LAB — COMPREHENSIVE METABOLIC PANEL
ALBUMIN: 4.2 g/dL (ref 3.5–5.0)
ALK PHOS: 103 U/L (ref 38–126)
ALT: 104 U/L — ABNORMAL HIGH (ref 17–63)
ANION GAP: 11 (ref 5–15)
AST: 174 U/L — ABNORMAL HIGH (ref 15–41)
BUN: 6 mg/dL (ref 6–20)
CHLORIDE: 107 mmol/L (ref 101–111)
CO2: 24 mmol/L (ref 22–32)
Calcium: 8.7 mg/dL — ABNORMAL LOW (ref 8.9–10.3)
Creatinine, Ser: 0.83 mg/dL (ref 0.61–1.24)
GFR calc Af Amer: >60 mL/min (ref >60)
GFR calc non Af Amer: >60 mL/min (ref >60)
GLUCOSE: 108 mg/dL — AB (ref 65–99)
POTASSIUM: 3.7 mmol/L (ref 3.5–5.1)
SODIUM: 142 mmol/L (ref 135–145)
Total Bilirubin: 0.5 mg/dL (ref 0.3–1.2)
Total Protein: 8.1 g/dL (ref 6.5–8.1)

## 2017-06-24 LAB — RAPID URINE DRUG SCREEN, HOSP PERFORMED
AMPHETAMINES: NOT DETECTED
BARBITURATES: NOT DETECTED
BENZODIAZEPINES: NOT DETECTED
COCAINE: NOT DETECTED
Opiates: NOT DETECTED
TETRAHYDROCANNABINOL: NOT DETECTED

## 2017-06-24 LAB — CBC WITH DIFFERENTIAL/PLATELET
BASOS ABS: 0.1 10*3/uL (ref 0.0–0.1)
BASOS PCT: 2 %
EOS ABS: 0.1 10*3/uL (ref 0.0–0.7)
Eosinophils Relative: 4 %
HCT: 32.6 % — ABNORMAL LOW (ref 39.0–52.0)
Hemoglobin: 9.6 g/dL — ABNORMAL LOW (ref 13.0–17.0)
LYMPHS ABS: 1.4 10*3/uL (ref 0.7–4.0)
Lymphocytes Relative: 40 %
MCH: 21.8 pg — AB (ref 26.0–34.0)
MCHC: 29.4 g/dL — AB (ref 30.0–36.0)
MCV: 73.9 fL — ABNORMAL LOW (ref 78.0–100.0)
MONO ABS: 0.7 10*3/uL (ref 0.1–1.0)
Monocytes Relative: 21 %
NEUTROS ABS: 1.2 10*3/uL — AB (ref 1.7–7.7)
Neutrophils Relative %: 33 %
Platelets: 220 10*3/uL (ref 150–400)
RBC: 4.41 MIL/uL (ref 4.22–5.81)
RDW: 19.8 % — AB (ref 11.5–15.5)
WBC: 3.5 10*3/uL — ABNORMAL LOW (ref 4.0–10.5)

## 2017-06-24 LAB — URINALYSIS, ROUTINE W REFLEX MICROSCOPIC
Bilirubin Urine: NEGATIVE
GLUCOSE, UA: NEGATIVE mg/dL
HGB URINE DIPSTICK: NEGATIVE
KETONES UR: NEGATIVE mg/dL
Leukocytes, UA: NEGATIVE
Nitrite: NEGATIVE
PH: 5 (ref 5.0–8.0)
Protein, ur: NEGATIVE mg/dL
Specific Gravity, Urine: 1.006 (ref 1.005–1.030)

## 2017-06-24 LAB — ACETAMINOPHEN LEVEL

## 2017-06-24 LAB — SALICYLATE LEVEL

## 2017-06-24 LAB — ETHANOL: Alcohol, Ethyl (B): 404 mg/dL (ref <10)

## 2017-06-24 MED ORDER — LORAZEPAM 2 MG/ML IJ SOLN: 0.0000 mg | Freq: Four times a day (QID) | INTRAMUSCULAR | Status: DC

## 2017-06-24 MED ORDER — ALUM & MAG HYDROXIDE-SIMETH 200-200-20 MG/5ML PO SUSP: 30.0000 mL | Freq: Four times a day (QID) | ORAL | Status: DC | PRN

## 2017-06-24 MED ORDER — LORAZEPAM 1 MG PO TABS
0.0000 mg | ORAL_TABLET | Freq: Four times a day (QID) | ORAL | Status: DC
  Administered 2017-06-25 (×3): 2 mg via ORAL
  Filled 2017-06-24 (×3): qty 2

## 2017-06-24 MED ORDER — IBUPROFEN 200 MG PO TABS: 600.0000 mg | ORAL_TABLET | Freq: Three times a day (TID) | ORAL | Status: DC | PRN

## 2017-06-24 MED ORDER — ONDANSETRON HCL 4 MG PO TABS: 4.0000 mg | ORAL_TABLET | Freq: Three times a day (TID) | ORAL | Status: DC | PRN

## 2017-06-24 MED ORDER — VITAMIN B-1 100 MG PO TABS
100.0000 mg | ORAL_TABLET | Freq: Every day | ORAL | Status: DC
  Administered 2017-06-25 – 2017-06-26 (×2): 100 mg via ORAL
  Filled 2017-06-24 (×2): qty 1

## 2017-06-24 MED ORDER — NICOTINE 21 MG/24HR TD PT24
21.0000 mg | MEDICATED_PATCH | Freq: Every day | TRANSDERMAL | Status: DC
  Filled 2017-06-24 (×2): qty 1

## 2017-06-24 MED ORDER — LORAZEPAM 2 MG/ML IJ SOLN: 0.0000 mg | Freq: Two times a day (BID) | INTRAMUSCULAR | Status: DC

## 2017-06-24 MED ORDER — LORAZEPAM 1 MG PO TABS: 0.0000 mg | ORAL_TABLET | Freq: Two times a day (BID) | ORAL | Status: DC

## 2017-06-24 MED ORDER — THIAMINE HCL 100 MG/ML IJ SOLN: 100.0000 mg | Freq: Every day | INTRAMUSCULAR | Status: DC

## 2017-06-24 NOTE — ED Notes (Addendum)
SBAR Report received from previous nurse. Pt received calm and visible on unit. Pt denies current HI, A/V H,  anxiety, or pain at this time, and appears otherwise stable and free of distress Pt endorses SI and depression at this time.  Will continue to assess.

## 2017-06-24 NOTE — BHH Counselor (Addendum)
Clinician noted pt's BAL was 404 at 2037. Per Shana Chute, pt is not medically cleared. Clinician spoke to [place], PA and expressed TTS consult will be completed when the pt's BAL is lower.   Update disposition with Richardson Landry, RN. Clinician to check back on pt's status.   Vertell Novak, MS, [facility], [facility] Triage Specialist [phone]

## 2017-06-24 NOTE — Progress Notes (Signed)
Pt to room 40.  Pt is 52 yr old male with hx of ETOH abuse.  Pt BAL= 404.  Pt is alert and steady.  Pt was in ED previous night.  Pt is irritated and asking about some kind of medicine.  Pt currently denies SI and contracts for safety.  Pt CIWA=2.  Pt is in bed watching TV.  Pt denies pain or discomfort with some minor tremors. Pt is disheveled and has body odor.  Pt encouraged to shower but refuses. Pt remains safe on unit.

## 2017-06-24 NOTE — ED Provider Notes (Signed)
[place] DEPT Provider Note   CSN: [id number] Arrival date & time: 06/24/17  1844     History   Chief Complaint Chief Complaint  Patient presents with  . Alcohol Intoxication    HPI Barry Moore is a 52 y.o. male with a history of homelessness, HTN, cocaine abuse, and alcohol abuse who presents to the emergency department with a chief complaint of suicidal ideation.  The patient states " I know how to fucking kill myself, and I'll do it." He states that he wants to stab himself with a knife.  He denies HI or auditory and visual hallucinations.  He also endorses low mood "for a long time."  He also endorses generalized abdominal pain.  No nausea or emesis.  He endorses diarrhea, but is unable to state how long it is been ongoing.  He also endorses chills, onset today.  No fever.  He denies dyspnea, rash, chest pain, melena, dysuria, hematochezia, or headache.  He has a history of homelessness and currently resides in a tent.  Staff states that on arrival the patient was wearing pants soiled with feces.  He endorses drinking three to four 40 ounce beers today.  Unable to state when his last beer was.  He smokes 1.5 ppd of cigarettes. Denies illicit or IVDU.    The history is provided by the patient. No language interpreter was used.  Alcohol Intoxication  Associated symptoms include abdominal pain. Pertinent negatives include no chest pain, no headaches and no shortness of breath.    Past Medical History:  Diagnosis Date  . Alcoholic hepatitis   . Depression   . ETOH abuse   . Hypertension   . Pancreatitis   . Seizures [facility])     Patient Active Problem List   Diagnosis Date Noted  . Severe recurrent major depression without psychotic features ([place]) 05/15/2017  . Anemia 03/18/2017  . Acute blood loss anemia 02/21/2017  . UGI bleed 02/08/2017  . Alcohol withdrawal ([place]) 02/08/2017  . Cocaine abuse ([place]) 11/18/2016  . GI bleed 11/01/2016  .  Alcohol abuse   . Major depressive disorder, recurrent severe without psychotic features ([place]) 07/06/2016  . Alcohol-induced mood disorder ([place]) 06/15/2016  . Seizure ([place]) 05/26/2016  . Tobacco abuse 05/26/2016  . Homeless 05/26/2016  . Essential hypertension 05/26/2016  . Alcohol dependence with alcohol-induced mood disorder ([place]) 02/14/2016  . Severe alcohol withdrawal without perceptual disturbances without complication ([place]) 74/25/9563  . Alcoholic hepatitis without ascites 02/14/2016  . Thrombocytopenia ([place]) 02/14/2016    Past Surgical History:  Procedure Laterality Date  . ESOPHAGOGASTRODUODENOSCOPY (EGD) WITH PROPOFOL N/A 11/02/2016   Procedure: ESOPHAGOGASTRODUODENOSCOPY (EGD) WITH PROPOFOL;  Surgeon: Carol Ada, MD;  Location: [facility] ENDOSCOPY;  Service: Endoscopy;  Laterality: N/A;  . HERNIA REPAIR    . LEG SURGERY          Home Medications    Prior to Admission medications   Medication Sig Start Date End Date Taking? Authorizing Provider  citalopram (CELEXA) 20 MG tablet Take 1 tablet (20 mg total) by mouth daily. For mood control Patient not taking: Reported on 06/02/2017 05/20/17   Money, Lowry Ram, FNP  gabapentin (NEURONTIN) 300 MG capsule Take 1 capsule (300 mg total) by mouth 3 (three) times daily. for withdrawal symptoms Patient not taking: Reported on 06/02/2017 05/19/17   Money, Lowry Ram, FNP  lisinopril (PRINIVIL,ZESTRIL) 10 MG tablet Take 1 tablet (10 mg total) by mouth daily. Patient not taking: Reported on 06/02/2017 05/20/17  Money, Lowry Ram, FNP  traZODone (DESYREL) 100 MG tablet Take 1 tablet (100 mg total) by mouth at bedtime as needed for sleep. Patient not taking: Reported on 06/02/2017 05/19/17   Money, Lowry Ram, FNP    Family History Family History  Problem Relation Age of Onset  . Diabetes Mother   . Alcoholism Mother   . Emphysema Father   . Lung cancer Father   . Alcoholism Father     Social History Social History   Tobacco Use  . Smoking  status: Current Every Day Smoker    Packs/day: 1.00    Types: Cigarettes  . Smokeless tobacco: Never Used  Substance Use Topics  . Alcohol use: Yes    Comment: 4-5 40oz beers daily  . Drug use: Yes    Frequency: 3.0 times per week    Types: Cocaine    Comment: last use today 2 weeks ago (04-09-17)      Allergies   Tomato and Aspirin   Review of Systems Review of Systems  Constitutional: Negative for appetite change, chills and fever.  HENT: Negative for congestion.   Respiratory: Negative for shortness of breath.   Cardiovascular: Negative for chest pain.  Gastrointestinal: Positive for abdominal pain and diarrhea. Negative for nausea and vomiting.  Genitourinary: Negative for dysuria, penile pain and testicular pain.  Musculoskeletal: Negative for back pain.  Skin: Negative for rash.  Allergic/Immunologic: Negative for immunocompromised state.  Neurological: Negative for dizziness, syncope, weakness, numbness and headaches.  Psychiatric/Behavioral: Negative for confusion.     Physical Exam Updated Vital Signs BP (!) 160/90 (BP Location: Right Arm)   Pulse 95   Temp 98.2 F (36.8 C) (Oral)   Resp 17   Ht 5\' 7"  (1.702 m)   Wt 86.2 kg (190 lb)   SpO2 96%   BMI 29.76 kg/m   Physical Exam  Constitutional: He is oriented to person, place, and time. He appears well-developed.  Disheveled and unkept.  HENT:  Head: Normocephalic.  Eyes: Conjunctivae are normal. No scleral icterus.  Neck: Normal range of motion. Neck supple.  Cardiovascular: Normal rate, regular rhythm, normal heart sounds and intact distal pulses. Exam reveals no gallop and no friction rub.  No murmur heard. Pulmonary/Chest: Effort normal. No stridor. No respiratory distress. He has no wheezes. He has no rales. He exhibits no tenderness.  Abdominal: Soft. He exhibits no distension and no mass. There is no rebound and no guarding. No hernia.  Inconsistent exam.  Patient was initially diffusely tender  with minimal palpation.  However, when pressure was applied using the stethoscope the patient had no tenderness.  Abdomen is soft, nondistended.  Normoactive bowel sounds in all 4 quadrants.  No rebound or guarding.  No CVA tenderness bilaterally.  Neurological: He is alert and oriented to person, place, and time.  No slurred speech.  Skin: Skin is warm and dry. Capillary refill takes less than 2 seconds. No rash noted. No erythema. No pallor.  Psychiatric: He has a normal mood and affect. His speech is normal. He is not actively hallucinating. Thought content is not delusional. He expresses suicidal ideation. He expresses no homicidal ideation. He expresses no homicidal plans. He is attentive.  Nursing note and vitals reviewed.   ED Treatments / Results  Labs (all labs ordered are listed, but only abnormal results are displayed) Labs Reviewed  COMPREHENSIVE METABOLIC PANEL - Abnormal; Notable for the following components:      Result Value   Glucose, Bld 108 (*)  Calcium 8.7 (*)    AST 174 (*)    ALT 104 (*)    All other components within normal limits  ETHANOL - Abnormal; Notable for the following components:   Alcohol, Ethyl (B) 404 (*)    All other components within normal limits  CBC WITH DIFFERENTIAL/PLATELET - Abnormal; Notable for the following components:   WBC 3.5 (*)    Hemoglobin 9.6 (*)    HCT 32.6 (*)    MCV 73.9 (*)    MCH 21.8 (*)    MCHC 29.4 (*)    RDW 19.8 (*)    Neutro Abs 1.2 (*)    All other components within normal limits  ACETAMINOPHEN LEVEL - Abnormal; Notable for the following components:   Acetaminophen (Tylenol), Serum <10 (*)    All other components within normal limits  RAPID URINE DRUG SCREEN, HOSP PERFORMED  SALICYLATE LEVEL  URINALYSIS, ROUTINE W REFLEX MICROSCOPIC    EKG None  Radiology No results found.  Procedures Procedures (including critical care time)  Medications Ordered in ED Medications  LORazepam (ATIVAN) injection 0-4  mg ( Intravenous See Alternative 06/24/17 2306)    Or  LORazepam (ATIVAN) tablet 0-4 mg (0 mg Oral Not Given 06/24/17 2306)  LORazepam (ATIVAN) injection 0-4 mg (has no administration in time range)    Or  LORazepam (ATIVAN) tablet 0-4 mg (has no administration in time range)  thiamine (VITAMIN B-1) tablet 100 mg (has no administration in time range)    Or  thiamine (B-1) injection 100 mg (has no administration in time range)  ibuprofen (ADVIL,MOTRIN) tablet 600 mg (has no administration in time range)  ondansetron (ZOFRAN) tablet 4 mg (has no administration in time range)  alum & mag hydroxide-simeth (MAALOX/MYLANTA) 200-200-20 MG/5ML suspension 30 mL (has no administration in time range)  nicotine (NICODERM CQ - dosed in mg/24 hours) patch 21 mg (has no administration in time range)     Initial Impression / Assessment and Plan / ED Course  I have reviewed the triage vital signs and the nursing notes.  Pertinent labs & imaging results that were available during my care of the patient were reviewed by me and considered in my medical decision making (see chart for details).     52 year old male with a history of homelessness, HTN, cocaine abuse, and alcohol abuse presenting with suicidal ideation and vague complaints of abdominal pain.  He is well-known to this ED and has been evaluated at 26 times in the last 6 months.  Abdominal exam is benign.  Doubt surgical abdomen.  AST 174.  ALT 104.  Elevated, but improved from previous.  He has no isolated right upper quadrant tenderness.  Ethanol 404; patient does not clinically appear intoxicated, likely secondary to chronic alcohol use.  Patient is medically cleared for TTS consult. CIWA protocol ordered. Psych hold orders and home med orders placed. TTS consult pending; please see psych team notes for further documentation of care/dispo. Pt stable at time of med clearance.    Final Clinical Impressions(s) / ED Diagnoses   Final diagnoses:  Acute  alcoholic intoxication without complication [facility])  Suicidal ideation    ED Discharge Orders    None       Joanne Gavel, PA-C 06/25/17 Napoleon Form, MD 06/25/17 [phone]

## 2017-06-24 NOTE — ED Triage Notes (Signed)
Per EMS, pt presents today after drinking 2 40 oz of beer/vodka. Pt is stating he wants to kill himself with a knife he has, however EMS did not find one.

## 2017-06-24 NOTE — ED Notes (Signed)
Date and time results received: 06/24/17 1945 (use smartphrase ".now" to insert current time)  Test: ETOHCritical Value: 404  Name of Provider Notified: MIA  Orders Received?  Or Actions Taken?:  Orders Received - See Orders for details

## 2017-06-25 MED ORDER — GABAPENTIN 300 MG PO CAPS
300.0000 mg | ORAL_CAPSULE | Freq: Two times a day (BID) | ORAL | Status: DC
  Administered 2017-06-25 – 2017-06-26 (×3): 300 mg via ORAL
  Filled 2017-06-25 (×3): qty 1

## 2017-06-25 NOTE — ED Notes (Signed)
Discussed with patient the need for a shower d/t body odor.  Pt is alert and has symptoms of detox.  Pt scores a 12 on CIWA.   Pt takes shower and returns to room.  Pt given drink and snack. Pt in bed awake.  Pt remains safe on unit.

## 2017-06-25 NOTE — ED Notes (Signed)
Patient eating his lunch at bedside.  Reports he feels to shaky to take a shower now, but will take one later.

## 2017-06-25 NOTE — BH Assessment (Addendum)
Assessment Note  Barry Moore is an 52 y.o. male.who presents voluntary and unaccompanied to [facility]. Clinician asked the pt, "what brought you to the hospital?" Pt reported, "I've been suicidal for a while." Pt reported, he would drink himself to death or use his knife." Pt reported, his main stressor his being homeless. Pt reported, he has been homeless for fifteen years. Pt reported, he wanted to hurt "any individual I dont mind seeing die." Pt reported, attempting suicide in the past. Pt denies, AVH and self-injurious behaviors.   Pt denies abuse. Pt reported, "I can't tell you, right much." Pt's BAL was 404 at 2037. Pt denies, being linked to OPT resources (medication management and/or counseling.)  Pt reported, previous inpatient admissions.   Pt presents disheveled, body order in scrubs with logical/coherent speech. Pt's eye contact was poor. Pt's mood was depressed. Pt's affect was flat. Pt's thought process was coherent/relevant. Pt's judgement was impaired. Pt's was oriented x4. Pt's concentration and insight are fair. Pt's impulse control was poor. Pt reported, he never really feel safe because he is homeless. Pt reported, if inpatient treatment is recommended he will sign-in voluntarily.    Diagnosis: F33.2 Major Depressive Disorder, recurrent, severe without psychotic features.                    F10.20 Alcohol Use Disorder, Severe.  Past Medical History:  Past Medical History:  Diagnosis Date  . Alcoholic hepatitis   . Depression   . ETOH abuse   . Hypertension   . Pancreatitis   . Seizures ([place])     Past Surgical History:  Procedure Laterality Date  . ESOPHAGOGASTRODUODENOSCOPY (EGD) WITH PROPOFOL N/A 11/02/2016   Procedure: ESOPHAGOGASTRODUODENOSCOPY (EGD) WITH PROPOFOL;  Surgeon: Carol Ada, MD;  [facility] END[facility];  Service: Endoscopy;  Laterality: N/A;  . HERNIA REPAIR    . LEG SURGERY      Family History:  Family History  Problem Relation Age of Onset  .  Diabetes Mother   . Alcoholism Mother   . Emphysema Father   . Lung cancer Father   . Alcoholism Father     Social History:  reports that he has been smoking cigarettes.  He has been smoking about 1.00 pack per day. He has never used smokeless tobacco. He reports that he drinks alcohol. He reports that he has current or past drug history. Drug: Cocaine. Frequency: 3.00 times per week.  Additional Social History:  Alcohol / Drug Use Pain Medications: See MAR Prescriptions: See MAR Over the Counter: See MAR History of alcohol / drug use?: Yes Substance #1 Name of Substance 1: Alcohol 1 - Age of First Use: Per chart, "in teens."  1 - Amount (size/oz): Pt reported, "I can't tell you, right much." Pt's BAL was 404 at 2037. 1 - Frequency: Daily. 1 - Duration: Ongoing.  1 - Last Use / Amount: Pt reported, yesterday, (06/24/2017).  CIWA: CIWA-Ar BP: (!) 160/90 Pulse Rate: 95 Nausea and Vomiting: no nausea and no vomiting Tactile Disturbances: none Tremor: not visible, but can be felt fingertip to fingertip Auditory Disturbances: not present Paroxysmal Sweats: no sweat visible Visual Disturbances: not present Anxiety: no anxiety, at ease Headache, Fullness in Head: very mild Agitation: normal activity Orientation and Clouding of Sensorium: oriented and can do serial additions CIWA-Ar Total: 2 COWS:    Allergies:  Allergies  Allergen Reactions  . Tomato Shortness Of Breath and Nausea And Vomiting  . Aspirin Nausea And Vomiting    Home  Medications:  (Not in a hospital admission)  OB/GYN Status:  No LMP for male patient.  General Assessment Data Assessment unable to be completed: Yes Reason for not completing assessment: Clinician noted pt's BAL was 404 at 2037. Per Shana Chute, pt is not medically cleared. Clinician spoke to [place], PA and expressed TTS consult will be completed when the pt's BAL is lower. Update disposition with Richardson Landry, RN. Clinician to check back on pt's status.   [facility] of Assessment: [facility] TTS Assessment: In system Is this a Tele or Face-to-Face Assessment?: Face-to-Face Is this an Initial Assessment or a Re-assessment for this encounter?: Initial Assessment Marital status: Single Is patient pregnant?: No Pregnancy Status: No Living Arrangements: Other (Comment)(Homeless.) Can pt return to current living arrangement?: Yes Admission Status: Voluntary Is patient capable of signing voluntary admission?: Yes Referral Source: Self/Family/Friend Insurance type: Self-pay.      Crisis Care Plan Living Arrangements: Other (Comment)(Homeless.) Legal Guardian: Other:(Self. ) Name of Psychiatrist: NA Name of Therapist: NA  Education Status Is patient currently in school?: No Current Grade: NA Highest grade of school patient has completed: 11th grade.  Name of school: NA Contact person: NA Is the patient employed, unemployed or receiving disability?: Unemployed  Risk to self with the past 6 months Suicidal Ideation: Yes-Currently Present Has patient been a risk to self within the past 6 months prior to admission? : Yes Suicidal Intent: Yes-Currently Present Has patient had any suicidal intent within the past 6 months prior to admission? : Yes Is patient at risk for suicide?: Yes Suicidal Plan?: Yes-Currently Present Has patient had any suicidal plan within the past 6 months prior to admission? : Yes Specify Current Suicidal Plan: Pt reported to stab himself with a knife.  Access to Means: Yes Specify Access to Suicidal Means: Pt reported, access to knives.  What has been your use of drugs/alcohol within the last 12 months?: Alcohol. Previous Attempts/Gestures: Yes How many times?: 1 Other Self Harm Risks: Pt denies.  Triggers for Past Attempts: Unknown Intentional Self Injurious Behavior: None(Pt denies. ) Family Suicide History: No Recent stressful life event(s): Other (Comment)(Homelessness. ) Persecutory voices/beliefs?:  No Depression: Yes Depression Symptoms: Feeling angry/irritable, Feeling worthless/self pity, Loss of interest in usual pleasures, Guilt, Fatigue, Isolating Substance abuse history and/or treatment for substance abuse?: Yes Suicide prevention information given to non-admitted patients: Not applicable  Risk to Others within the past 6 months Homicidal Ideation: Yes-Currently Present Does patient have any lifetime risk of violence toward others beyond the six months prior to admission? : Yes (comment)(Pt reported, he was charged with assault 1-2 years ago.) Thoughts of Harm to Others: No Comment - Thoughts of Harm to Others: Pt reported, he wanted to hurt "any individual I dont mind seeing die."  Current Homicidal Intent: Yes-Currently Present Current Homicidal Plan: No Access to Homicidal Means: No Identified Victim: Any individual he don't mind seeing die.  History of harm to others?: Yes Assessment of Violence: In distant past Violent Behavior Description: Pt reported, he was charged with assault 1-2 years ago. Does patient have access to weapons?: Yes (Comment)(Pt reported, access to a knife. ) Criminal Charges Pending?: No Does patient have a court date: No Is patient on probation?: No  Psychosis Hallucinations: None noted Delusions: None noted  Mental Status Report Appearance/Hygiene: In scrubs, Disheveled, Body odor Eye Contact: Poor Motor Activity: Unremarkable Speech: Logical/coherent Level of Consciousness: Quiet/awake Mood: Depressed Affect: Flat Anxiety Level: None Thought Processes: Coherent, Relevant Judgement: Impaired Orientation: Person, Place, Time,  Situation Obsessive Compulsive Thoughts/Behaviors: Unable to Assess  Cognitive Functioning Concentration: Fair Memory: Recent Intact Is patient IDD: No Is patient DD?: No Insight: Fair Impulse Control: Poor Appetite: Poor Sleep: Decreased Total Hours of Sleep: (Pt reported, "not much." ) Vegetative  Symptoms: None  ADLScreening [facility] Assessment Services) Patient's cognitive ability adequate to safely complete daily activities?: Yes Patient able to express need for assistance with ADLs?: Yes Independently performs ADLs?: Yes (appropriate for developmental age)  Prior Inpatient Therapy Prior Inpatient Therapy: Yes Prior Therapy Dates: Per chart, 2019. Prior Therapy Facilty/Provider(s): Per chart, [facility].  Reason for Treatment: Per chart, alcohol issues.   Prior Outpatient Therapy Prior Outpatient Therapy: No Prior Therapy Dates: NA Prior Therapy Facilty/Provider(s): NA Reason for Treatment: NA Does patient have an ACCT team?: No Does patient have Intensive In-House Services?  : No Does patient have [facility] services? : No Does patient have P4CC services?: No  ADL Screening (condition at time of admission) Patient's cognitive ability adequate to safely complete daily activities?: Yes Is the patient deaf or have difficulty hearing?: No Does the patient have difficulty seeing, even when wearing glasses/contacts?: No Does the patient have difficulty concentrating, remembering, or making decisions?: Yes Patient able to express need for assistance with ADLs?: Yes Does the patient have difficulty dressing or bathing?: No Independently performs ADLs?: Yes (appropriate for developmental age) Does the patient have difficulty walking or climbing stairs?: No Weakness of Arms/Hands: None  Home Assistive Devices/Equipment Home Assistive Devices/Equipment: None    Abuse/Neglect Assessment (Assessment to be complete while patient is alone) Abuse/Neglect Assessment Can Be Completed: Yes Physical Abuse: Denies(Pt denies. ) Verbal Abuse: Denies(Pt denies.) Sexual Abuse: Denies(Pt denies. ) Exploitation of patient/patient's resources: Denies(Pt denies. ) Self-Neglect: Denies(Pt denies. )     Advance Directives (For Healthcare) Does Patient Have a Medical Advance Directive?: No     Additional Information 1:1 In Past 12 Months?: No CIRT Risk: No Elopement Risk: No Does patient have medical clearance?: No     Disposition: Lindon Romp, NP recommends overnight observation for safety and stabilization. Disposition discussed with Ken-Tyler, PA and Richardson Landry, RN.   Disposition Initial Assessment Completed for this Encounter: Yes Disposition of Patient: (overnight observation for safety and stabilization.) Patient refused recommended treatment: No Mode of transportation if patient is discharged?: N/A  On Site Evaluation by:  Alyson Ingles. Ally Knodel, MS, LPC, CRC. Reviewed with Physician:  Barton Fanny, PA and Lindon Romp, NP.  Vertell Novak 06/25/2017 6:48 AM   Vertell Novak, MS, [facility], [facility] Triage Specialist [phone]

## 2017-06-26 DIAGNOSIS — F1024 Alcohol dependence with alcohol-induced mood disorder: Secondary | ICD-10-CM

## 2017-06-26 DIAGNOSIS — R45851 Suicidal ideations: Secondary | ICD-10-CM

## 2017-06-26 DIAGNOSIS — Z811 Family history of alcohol abuse and dependence: Secondary | ICD-10-CM

## 2017-06-26 DIAGNOSIS — F17210 Nicotine dependence, cigarettes, uncomplicated: Secondary | ICD-10-CM

## 2017-06-26 MED ORDER — GABAPENTIN 300 MG PO CAPS: 300.0000 mg | ORAL_CAPSULE | Freq: Three times a day (TID) | ORAL | 0 refills | Status: DC

## 2017-06-26 MED ORDER — GABAPENTIN 300 MG PO CAPS: 300.0000 mg | ORAL_CAPSULE | Freq: Three times a day (TID) | ORAL | Status: DC

## 2017-06-26 NOTE — ED Notes (Signed)
Pt was discharged per order. Pt was ambulatory and in no acute distress. He declined an offer of peer support services prior to discharge. He received his AVS and was offered an opportunity to ask questions. Bus pass and belongings were given. Pt signed that he had received all of the latter. Pt was escorted off the unit by staff.

## 2017-06-26 NOTE — [facility] Suicide Risk Assessment (Signed)
Suicide Risk Assessment  Discharge Assessment   [facility] Discharge Suicide Risk Assessment   Principal Problem: Alcohol dependence with alcohol-induced mood disorder [facility]) Discharge Diagnoses:  Patient Active Problem List   Diagnosis Date Noted  . Alcohol dependence with alcohol-induced mood disorder ([place]) [F10.24] 02/14/2016    Priority: High  . Cocaine abuse ([place]) [F14.10] 11/18/2016    Priority: Low  . Severe recurrent major depression without psychotic features ([place]) [F33.2] 05/15/2017  . Anemia [D64.9] 03/18/2017  . Acute blood loss anemia [D62] 02/21/2017  . UGI bleed [K92.2] 02/08/2017  . Alcohol withdrawal ([place]) [F10.239] 02/08/2017  . GI bleed [K92.2] 11/01/2016  . Alcohol abuse [F10.10]   . Major depressive disorder, recurrent severe without psychotic features ([place]) [F33.2] 07/06/2016  . Alcohol-induced mood disorder ([place]) [F10.94] 06/15/2016  . Seizure ([place]) [R56.9] 05/26/2016  . Tobacco abuse [Z72.0] 05/26/2016  . Homeless [Z59.0] 05/26/2016  . Essential hypertension [I10] 05/26/2016  . Severe alcohol withdrawal without perceptual disturbances without complication ([place]) [X91.478] 02/14/2016  . Alcoholic hepatitis without ascites [K70.10] 02/14/2016  . Thrombocytopenia ([place]) [D69.6] 02/14/2016    Total Time spent with patient: 45 minutes   Musculoskeletal: Strength & Muscle Tone: within normal limits Gait & Station: normal Patient leans: N/A  Psychiatric Specialty Exam: Physical Exam  Constitutional: He is oriented to person, place, and time. He appears well-developed and well-nourished.  HENT:  Head: Normocephalic.  Neck: Normal range of motion.  Respiratory: Effort normal.  Musculoskeletal: Normal range of motion.  Neurological: He is alert and oriented to person, place, and time.  Psychiatric: He has a normal mood and affect. His speech is normal and behavior is normal. Judgment and thought content normal. Cognition and memory are normal.    Review of  Systems  Psychiatric/Behavioral: Positive for substance abuse.  All other systems reviewed and are negative.   Blood pressure (!) 151/74, pulse 76, temperature 98.2 F (36.8 C), temperature source Oral, resp. rate 18, height 5\' 7"  (1.702 m), weight 86.2 kg (190 lb), SpO2 99 %.Body mass index is 29.76 kg/m.  General Appearance: Disheveled  Eye Contact:  Good  Speech:  Normal Rate  Volume:  Normal  Mood:  Euthymic  Affect:  Congruent  Thought Process:  Coherent and Descriptions of Associations: Intact  Orientation:  Full (Time, Place, and Person)  Thought Content:  WDL and Logical  Suicidal Thoughts:  No  Homicidal Thoughts:  No  Memory:  Immediate;   Good Recent;   Good Remote;   Good  Judgement:  Fair  Insight:  Fair  Psychomotor Activity:  Normal  Concentration:  Concentration: Good and Attention Span: Good  Recall:  Good  Fund of Knowledge:  Fair  Language:  Good  Akathisia:  No  Handed:  Right  AIMS (if indicated):     Assets:  Leisure Time Physical Health Resilience  ADL's:  Intact  Cognition:  WNL  Sleep:      Mental Status Per Nursing Assessment::   On Admission:   alcohol intoxication with suicidal ideations  Demographic Factors:  Male and Caucasian  Loss Factors: NA  Historical Factors: NA  Risk Reduction Factors:   Sense of responsibility to family and Positive therapeutic relationship  Continued Clinical Symptoms:  None   Cognitive Features That Contribute To Risk:  None    Suicide Risk:  Minimal: No identifiable suicidal ideation.  Patients presenting with no risk factors but with morbid ruminations; may be classified as minimal risk based on the severity of the depressive symptoms  Plan Of Care/Follow-up recommendations:  Activity:  as tolerated Diet:  heart healthy diet  Aser Nylund, NP 06/26/2017, 10:12 AM

## 2017-06-26 NOTE — Consult Note (Addendum)
[facility] Face-to-Face Psychiatry Consult   Reason for Consult:  Alcohol abuse with suicidal ideations Referring Physician:  EDP Patient Identification: Barry Moore MRN:  [id number] Principal Diagnosis: Alcohol dependence with alcohol-induced mood disorder (HCC) Diagnosis:   Patient Active Problem List   Diagnosis Date Noted  . Alcohol dependence with alcohol-induced mood disorder (HCC) [F10.24] 02/14/2016    Priority: High  . Cocaine abuse (H[facility]) [F14.10] 11/18/2016    Priority: Low  . Severe recurrent major depression without psychotic features (HCC) [F33.2] 05/15/2017  . Anemia [D64.9] 03/18/2017  . Acute blood loss anemia [D62] 02/21/2017  . UGI bleed [K92.2] 02/08/2017  . Alcohol withdrawal (HCC) [F10.239] 02/08/2017  . GI bleed [K92.2] 11/01/2016  . Alcohol abuse [F10.10]   . Major depressive disorder, recurrent severe without psychotic features (HCC) [F33.2] 07/06/2016  . Alcohol-induced mood disorder (HCC) [F10.94] 06/15/2016  . Seizure (HCC) [R56.9] 05/26/2016  . Tobacco abuse [Z72.0] 05/26/2016  . Homeless [Z59.0] 05/26/2016  . Essential hypertension [I10] 05/26/2016  . Severe alcohol withdrawal without perceptual disturbances without complication (HCC) [F10.230] 02/14/2016  . Alcoholic hepatitis without ascites [K70.10] 02/14/2016  . Thrombocytopenia (HCC) [D69.6] 02/14/2016    Total Time spent with patient: 45 minutes  Subjective:   Barry Moore is a 51 y.o. male patient does not warrant admission.  HPI:  51 yo male who presented to the ED with alcohol intoxication and suicidal ideations.  Today, he is clear and coherent with no suicidal/homicidal ideations, hallucinations, and withdrawal symptoms.  Barry Moore is well-known to this ED and providers.  Peer support consult completed yesterday.  Today, he requests to leave and wants to follow-up outpatient and with AA.  Stable for discharge.  Past Psychiatric History: alcohol abuse, depression  Risk to Self: NOne  Risk to  Others: None Prior Inpatient Therapy: Prior Inpatient Therapy: Yes Prior Therapy Dates: Per chart, 2019. Prior Therapy Facilty/Provider(s): Per chart, [facility].  Reason for Treatment: Per chart, alcohol issues.  Prior Outpatient Therapy: Prior Outpatient Therapy: No Prior Therapy Dates: NA Prior Therapy Facilty/Provider(s): NA Reason for Treatment: NA Does patient have an ACCT team?: No Does patient have Intensive In-House Services?  : No Does patient have [facility] services? : No Does patient have P4CC services?: No  Past Medical History:  Past Medical History:  Diagnosis Date  . Alcoholic hepatitis   . Depression   . ETOH abuse   . Hypertension   . Pancreatitis   . Seizures (HCC)     Past Surgical History:  Procedure Laterality Date  . ESOPHAGOGASTRODUODENOSCOPY (EGD) WITH PROPOFOL N/A 11/02/2016   Procedure: ESOPHAGOGASTRODUODENOSCOPY (EGD) WITH PROPOFOL;  Surgeon: Hung, Patrick, MD;  Location: [facility];  Service: Endoscopy;  Laterality: N/A;  . HERNIA REPAIR    . LEG SURGERY     Family History:  Family History  Problem Relation Age of Onset  . Diabetes Mother   . Alcoholism Mother   . Emphysema Father   . Lung cancer Father   . Alcoholism Father    Family Psychiatric  History: father and mother with alcohol Social History:  Social History   Substance and Sexual Activity  Alcohol Use Yes   Comment: 4-5 40oz beers daily     Social History   Substance and Sexual Activity  Drug Use Yes  . Frequency: 3.0 times per week  . Types: Cocaine   Comment: last use today 2 weeks ago (04-09-17)     Social History   Socioeconomic History  . Marital status: Divorced    Spouse   name: Not on file  . Number of children: Not on file  . Years of education: Not on file  . Highest education level: Not on file  Occupational History  . Not on file  Social Needs  . Financial resource strain: Not on file  . Food insecurity:    Worry: Not on file    Inability: Not on file   . Transportation needs:    Medical: Not on file    Non-medical: Not on file  Tobacco Use  . Smoking status: Current Every Day Smoker    Packs/day: 1.00    Types: Cigarettes  . Smokeless tobacco: Never Used  Substance and Sexual Activity  . Alcohol use: Yes    Comment: 4-5 40oz beers daily  . Drug use: Yes    Frequency: 3.0 times per week    Types: Cocaine    Comment: last use today 2 weeks ago (04-09-17)   . Sexual activity: Not Currently  Lifestyle  . Physical activity:    Days per week: Not on file    Minutes per session: Not on file  . Stress: Not on file  Relationships  . Social connections:    Talks on phone: Not on file    Gets together: Not on file    Attends religious service: Not on file    Active member of club or organization: Not on file    Attends meetings of clubs or organizations: Not on file    Relationship status: Not on file  Other Topics Concern  . Not on file  Social History Narrative  . Not on file   Additional Social History:    Allergies:   Allergies  Allergen Reactions  . Tomato Shortness Of Breath and Nausea And Vomiting  . Aspirin Nausea And Vomiting    Labs:  Results for orders placed or performed during the hospital encounter of 06/24/17 (from the past 48 hour(s))  Comprehensive metabolic panel     Status: Abnormal   Collection Time: 06/24/17  8:37 PM  Result Value Ref Range   Sodium 142 135 - 145 mmol/L   Potassium 3.7 3.5 - 5.1 mmol/L   Chloride 107 101 - 111 mmol/L   CO2 24 22 - 32 mmol/L   Glucose, Bld 108 (H) 65 - 99 mg/dL   BUN 6 6 - 20 mg/dL   Creatinine, Ser 0.83 0.61 - 1.24 mg/dL   Calcium 8.7 (L) 8.9 - 10.3 mg/dL   Total Protein 8.1 6.5 - 8.1 g/dL   Albumin 4.2 3.5 - 5.0 g/dL   AST 174 (H) 15 - 41 U/L   ALT 104 (H) 17 - 63 U/L   Alkaline Phosphatase 103 38 - 126 U/L   Total Bilirubin 0.5 0.3 - 1.2 mg/dL   GFR calc non Af Amer >60 >60 mL/min   GFR calc Af Amer >60 >60 mL/min    Comment: (NOTE) The eGFR has been  calculated using the CKD EPI equation. This calculation has not been validated in all clinical situations. eGFR's persistently <60 mL/min signify possible Chronic Kidney Disease.    Anion gap 11 5 - 15    Comment: Performed at [facility], [place] [address], [place], [place] [address]  Ethanol     Status: Abnormal   Collection Time: 06/24/17  8:37 PM  Result Value Ref Range   Alcohol, Ethyl (B) 404 (HH) <10 mg/dL    Comment:        LOWEST DETECTABLE LIMIT FOR SERUM  ALCOHOL IS 10 mg/dL FOR MEDICAL PURPOSES ONLY CRITICAL RESULT CALLED TO, READ BACK BY AND VERIFIED WITH: JUDY T, RN AT 2135 ON 06/24/2017 BY MOSLEY,J Performed at [facility], [address] [place], [place] [address]   CBC with Diff     Status: Abnormal   Collection Time: 06/24/17  8:37 PM  Result Value Ref Range   WBC 3.5 (L) 4.0 - 10.5 K/uL   RBC 4.41 4.22 - 5.81 MIL/uL   Hemoglobin 9.6 (L) 13.0 - 17.0 g/dL   HCT 32.6 (L) 39.0 - 52.0 %   MCV 73.9 (L) 78.0 - 100.0 fL   MCH 21.8 (L) 26.0 - 34.0 pg   MCHC 29.4 (L) 30.0 - 36.0 g/dL   RDW 19.8 (H) 11.5 - 15.5 %   Platelets 220 150 - 400 K/uL   Neutrophils Relative % 33 %   Lymphocytes Relative 40 %   Monocytes Relative 21 %   Eosinophils Relative 4 %   Basophils Relative 2 %   Neutro Abs 1.2 (L) 1.7 - 7.7 K/uL   Lymphs Abs 1.4 0.7 - 4.0 K/uL   Monocytes Absolute 0.7 0.1 - 1.0 K/uL   Eosinophils Absolute 0.1 0.0 - 0.7 K/uL   Basophils Absolute 0.1 0.0 - 0.1 K/uL   RBC Morphology TARGET CELLS     Comment: Performed at [facility], [address] [place], [place] [address]  Acetaminophen level     Status: Abnormal   Collection Time: 06/24/17  8:37 PM  Result Value Ref Range   Acetaminophen (Tylenol), Serum <10 (L) 10 - 30 ug/mL    Comment:        THERAPEUTIC CONCENTRATIONS VARY SIGNIFICANTLY. A RANGE OF 10-30 ug/mL MAY BE AN EFFECTIVE CONCENTRATION FOR MANY PATIENTS. HOWEVER, SOME ARE BEST TREATED AT  CONCENTRATIONS OUTSIDE THIS RANGE. ACETAMINOPHEN CONCENTRATIONS >150 ug/mL AT 4 HOURS AFTER INGESTION AND >50 ug/mL AT 12 HOURS AFTER INGESTION ARE OFTEN ASSOCIATED WITH TOXIC REACTIONS. Performed at [facility], [address] [place], [place] [address]   Salicylate level     Status: None   Collection Time: 06/24/17  8:37 PM  Result Value Ref Range   Salicylate Lvl <7.0 2.8 - 30.0 mg/dL    Comment: Performed at [facility], [address] [place], [place] [address]  Urine rapid drug screen (hosp performed)     Status: None   Collection Time: 06/24/17  8:42 PM  Result Value Ref Range   Opiates NONE DETECTED NONE DETECTED   Cocaine NONE DETECTED NONE DETECTED   Benzodiazepines NONE DETECTED NONE DETECTED   Amphetamines NONE DETECTED NONE DETECTED   Tetrahydrocannabinol NONE DETECTED NONE DETECTED   Barbiturates NONE DETECTED NONE DETECTED    Comment: (NOTE) DRUG SCREEN FOR MEDICAL PURPOSES ONLY.  IF CONFIRMATION IS NEEDED FOR ANY PURPOSE, NOTIFY LAB WITHIN 5 DAYS. LOWEST DETECTABLE LIMITS FOR URINE DRUG SCREEN Drug Class                     Cutoff (ng/mL) Amphetamine and metabolites    1000 Barbiturate and metabolites    200 Benzodiazepine                 200 Tricyclics and metabolites     300 Opiates and metabolites        300 Cocaine and metabolites        300 THC                              50 Performed at [facility], [address] [place], [place] [address]   Urinalysis, Routine w reflex microscopic     Status: None   Collection Time: 06/24/17  8:42 PM  Result Value Ref Range   Color, Urine YELLOW YELLOW   APPearance CLEAR CLEAR   Specific Gravity, Urine 1.006 1.005 - 1.030   pH 5.0 5.0 - 8.0   Glucose, UA NEGATIVE NEGATIVE mg/dL   Hgb urine dipstick NEGATIVE NEGATIVE   Bilirubin Urine NEGATIVE NEGATIVE   Ketones, ur NEGATIVE NEGATIVE mg/dL   Protein, ur NEGATIVE NEGATIVE mg/dL   Nitrite NEGATIVE  NEGATIVE   Leukocytes, UA NEGATIVE NEGATIVE    Comment: Performed at [facility], [address] [place], [place] [address]    Current Facility-Administered Medications  Medication Dose Route Frequency Provider Last Rate Last Dose  . alum & mag hydroxide-simeth (MAALOX/MYLANTA) 200-200-20 MG/5ML suspension 30 mL  30 mL Oral Q6H PRN McDonald, Mia A, PA-C      . gabapentin (NEURONTIN) capsule 300 mg  300 mg Oral BID , , MD   300 mg at 06/26/17 0916  . ibuprofen (ADVIL,MOTRIN) tablet 600 mg  600 mg Oral Q8H PRN McDonald, Mia A, PA-C      . LORazepam (ATIVAN) injection 0-4 mg  0-4 mg Intravenous Q6H McDonald, Mia A, PA-C       Or  . LORazepam (ATIVAN) tablet 0-4 mg  0-4 mg Oral Q6H McDonald, Mia A, PA-C   2 mg at 06/25/17 2233  . [START ON 06/27/2017] LORazepam (ATIVAN) injection 0-4 mg  0-4 mg Intravenous Q12H McDonald, Mia A, PA-C       Or  . [START ON 06/27/2017] LORazepam (ATIVAN) tablet 0-4 mg  0-4 mg Oral Q12H McDonald, Mia A, PA-C      . nicotine (NICODERM CQ - dosed in mg/24 hours) patch 21 mg  21 mg Transdermal Daily McDonald, Mia A, PA-C      . ondansetron (ZOFRAN) tablet 4 mg  4 mg Oral Q8H PRN McDonald, Mia A, PA-C      . thiamine (VITAMIN B-1) tablet 100 mg  100 mg Oral Daily McDonald, Mia A, PA-C   100 mg at 06/26/17 0916   Or  . thiamine (B-1) injection 100 mg  100 mg Intravenous Daily McDonald, Mia A, PA-C       Current Outpatient Medications  Medication Sig Dispense Refill  . citalopram (CELEXA) 20 MG tablet Take 1 tablet (20 mg total) by mouth daily. For mood control (Patient not taking: Reported on 06/02/2017) 30 tablet 0  . gabapentin (NEURONTIN) 300 MG capsule Take 1 capsule (300 mg total) by mouth 3 (three) times daily. for withdrawal symptoms (Patient not taking: Reported on 06/02/2017) 90 capsule 0  . lisinopril (PRINIVIL,ZESTRIL) 10 MG tablet Take 1 tablet (10 mg total) by mouth daily. (Patient not taking: Reported on 06/02/2017) 30 tablet 0   . traZODone (DESYREL) 100 MG tablet Take 1 tablet (100 mg total) by mouth at bedtime as needed for sleep. (Patient not taking: Reported on 06/02/2017) 30 tablet 0    Musculoskeletal: Strength & Muscle Tone: within normal limits Gait & Station: normal Patient leans: N/A  Psychiatric Specialty Exam: Physical Exam  Constitutional: He is oriented to person, place, and time. He appears well-developed and well-nourished.  HENT:  Head: Normocephalic.  Neck: Normal range of motion.  Respiratory: Effort normal.  Musculoskeletal: Normal range of motion.  Neurological: He is alert and oriented to person, place, and   time.  Psychiatric: He has a normal mood and affect. His speech is normal and behavior is normal. Judgment and thought content normal. Cognition and memory are normal.    Review of Systems  Psychiatric/Behavioral: Positive for substance abuse.  All other systems reviewed and are negative.   Blood pressure (!) 151/74, pulse 76, temperature 98.2 F (36.8 C), temperature source Oral, resp. rate 18, height 5' 7" (1.702 m), weight 86.2 kg (190 lb), SpO2 99 %.Body mass index is 29.76 kg/m.  General Appearance: Disheveled  Eye Contact:  Good  Speech:  Normal Rate  Volume:  Normal  Mood:  Euthymic  Affect:  Congruent  Thought Process:  Coherent and Descriptions of Associations: Intact  Orientation:  Full (Time, Place, and Person)  Thought Content:  WDL and Logical  Suicidal Thoughts:  No  Homicidal Thoughts:  No  Memory:  Immediate;   Good Recent;   Good Remote;   Good  Judgement:  Fair  Insight:  Fair  Psychomotor Activity:  Normal  Concentration:  Concentration: Good and Attention Span: Good  Recall:  Good  Fund of Knowledge:  Fair  Language:  Good  Akathisia:  No  Handed:  Right  AIMS (if indicated):     Assets:  Leisure Time Physical Health Resilience  ADL's:  Intact  Cognition:  WNL  Sleep:        Treatment Plan Summary: Daily contact with patient to assess  and evaluate symptoms and progress in treatment, Medication management and Plan alcohol abuse with alcohol induced mood disorder:  -Crisis stabilization -Medication management:  Started Ativan alcohol detox protocol and gabapentin 300 mg TID for alcohol withdrawal symptoms. -Individual and substance abuse counseling -Peer support consult  Disposition: No evidence of imminent risk to self or others at present.    LORD, JAMISON, NP 06/26/2017 10:08 AM  Patient seen face-to-face for psychiatric evaluation, chart reviewed and case discussed with the physician extender and developed treatment plan. Reviewed the information documented and agree with the treatment plan.  , MD  

## 2017-07-15 ENCOUNTER — Encounter (HOSPITAL_COMMUNITY): Payer: Self-pay | Admitting: Emergency Medicine

## 2017-07-15 ENCOUNTER — Emergency Department (HOSPITAL_COMMUNITY)
Admission: EM | Admit: 2017-07-15 | Discharge: 2017-07-16 | Disposition: A | Discharge status: Other Acute Inpatient Hospital | Payer: Self-pay | Attending: Emergency Medicine | Admitting: Emergency Medicine

## 2017-07-15 DIAGNOSIS — F1010 Alcohol abuse, uncomplicated: Secondary | ICD-10-CM | POA: Insufficient documentation

## 2017-07-15 DIAGNOSIS — F17210 Nicotine dependence, cigarettes, uncomplicated: Secondary | ICD-10-CM | POA: Insufficient documentation

## 2017-07-15 DIAGNOSIS — F32A Depression, unspecified: Secondary | ICD-10-CM

## 2017-07-15 DIAGNOSIS — F329 Major depressive disorder, single episode, unspecified: Secondary | ICD-10-CM | POA: Insufficient documentation

## 2017-07-15 DIAGNOSIS — I10 Essential (primary) hypertension: Secondary | ICD-10-CM | POA: Insufficient documentation

## 2017-07-15 DIAGNOSIS — Z590 Homelessness: Secondary | ICD-10-CM | POA: Insufficient documentation

## 2017-07-15 DIAGNOSIS — R45851 Suicidal ideations: Secondary | ICD-10-CM | POA: Insufficient documentation

## 2017-07-15 DIAGNOSIS — Z79899 Other long term (current) drug therapy: Secondary | ICD-10-CM | POA: Insufficient documentation

## 2017-07-15 DIAGNOSIS — F1410 Cocaine abuse, uncomplicated: Secondary | ICD-10-CM | POA: Insufficient documentation

## 2017-07-15 LAB — CBC WITH DIFFERENTIAL/PLATELET
BASOS ABS: 0.1 10*3/uL (ref 0.0–0.1)
Basophils Relative: 2 %
EOS ABS: 0.2 10*3/uL (ref 0.0–0.7)
Eosinophils Relative: 6 %
HCT: 33.6 % — ABNORMAL LOW (ref 39.0–52.0)
Hemoglobin: 10.4 g/dL — ABNORMAL LOW (ref 13.0–17.0)
LYMPHS ABS: 1.1 10*3/uL (ref 0.7–4.0)
Lymphocytes Relative: 42 %
MCH: 22.6 pg — AB (ref 26.0–34.0)
MCHC: 31 g/dL (ref 30.0–36.0)
MCV: 72.9 fL — AB (ref 78.0–100.0)
MONO ABS: 0.5 10*3/uL (ref 0.1–1.0)
Monocytes Relative: 17 %
NEUTROS ABS: 1 10*3/uL — AB (ref 1.7–7.7)
Neutrophils Relative %: 33 %
PLATELETS: 85 10*3/uL — AB (ref 150–400)
RBC: 4.61 MIL/uL (ref 4.22–5.81)
RDW: 19.2 % — ABNORMAL HIGH (ref 11.5–15.5)
WBC: 2.9 10*3/uL — ABNORMAL LOW (ref 4.0–10.5)

## 2017-07-15 LAB — RAPID URINE DRUG SCREEN, HOSP PERFORMED
Amphetamines: NOT DETECTED
Barbiturates: NOT DETECTED
Benzodiazepines: NOT DETECTED
Cocaine: NOT DETECTED
OPIATES: NOT DETECTED
TETRAHYDROCANNABINOL: NOT DETECTED

## 2017-07-15 LAB — I-STAT CHEM 8, ED
BUN: 4 mg/dL — ABNORMAL LOW (ref 6–20)
CALCIUM ION: 1.01 mmol/L — AB (ref 1.15–1.40)
Chloride: 105 mmol/L (ref 101–111)
Creatinine, Ser: 1.3 mg/dL — ABNORMAL HIGH (ref 0.61–1.24)
GLUCOSE: 92 mg/dL (ref 65–99)
HCT: 37 % — ABNORMAL LOW (ref 39.0–52.0)
HEMOGLOBIN: 12.6 g/dL — AB (ref 13.0–17.0)
Potassium: 3.9 mmol/L (ref 3.5–5.1)
Sodium: 143 mmol/L (ref 135–145)
TCO2: 24 mmol/L (ref 22–32)

## 2017-07-15 MED ORDER — VITAMIN B-1 100 MG PO TABS
100.0000 mg | ORAL_TABLET | Freq: Every day | ORAL | Status: DC
  Administered 2017-07-16: 100 mg via ORAL
  Filled 2017-07-15: qty 1

## 2017-07-15 MED ORDER — LORAZEPAM 2 MG/ML IJ SOLN: 0.0000 mg | Freq: Four times a day (QID) | INTRAMUSCULAR | Status: DC

## 2017-07-15 MED ORDER — LORAZEPAM 2 MG/ML IJ SOLN: 0.0000 mg | Freq: Two times a day (BID) | INTRAMUSCULAR | Status: DC

## 2017-07-15 MED ORDER — LORAZEPAM 1 MG PO TABS: 0.0000 mg | ORAL_TABLET | Freq: Two times a day (BID) | ORAL | Status: DC

## 2017-07-15 MED ORDER — THIAMINE HCL 100 MG/ML IJ SOLN: 100.0000 mg | Freq: Every day | INTRAMUSCULAR | Status: DC

## 2017-07-15 MED ORDER — LORAZEPAM 1 MG PO TABS
0.0000 mg | ORAL_TABLET | Freq: Four times a day (QID) | ORAL | Status: DC
  Administered 2017-07-16 (×2): 1 mg via ORAL
  Filled 2017-07-15 (×2): qty 1

## 2017-07-15 NOTE — ED Notes (Signed)
Bed: WA08 Expected date:  Expected time:  Means of arrival:  Comments: ETOH w/ seizure, emesis

## 2017-07-15 NOTE — ED Triage Notes (Signed)
Per EMS, patient from the street, friend reports seizure today. EMS reports seizure like activity en route, reports patient tracking with eyes and responding during that time. Reports drinking ETOH today.

## 2017-07-15 NOTE — ED Notes (Signed)
Pt states "I seriously want to die"

## 2017-07-15 NOTE — ED Notes (Signed)
Patient made aware urine sample is needed. 

## 2017-07-15 NOTE — ED Notes (Signed)
Patient requesting food. Made aware he can have nothing by mouth at this time.

## 2017-07-15 NOTE — ED Notes (Signed)
Patient given sandwich, cheese, and ginger ale.

## 2017-07-15 NOTE — ED Provider Notes (Signed)
[facility] DEPT Provider Note   CSN: [id number] Arrival date & time: 07/15/17  1723     History   Chief Complaint Chief Complaint  Patient presents with  . Alcohol Intoxication    HPI Barry Moore is a 52 y.o. male.  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  Patient admits to drinking today but does not know how much.  He denies any drug use.  He states that he supposed to be on medications for his seizures but does not have them because it cost too much.  When asked if he goes to the [facility] he states the people they are not doing what they are supposed to.  Patient states he just feels like giving up and he does not know what else to do.  He states there is one person he would like to kill and if he came in contact with that person he would kill that person without a second thought.  Patient is requesting to talk to a behavioral health provider.  The history is provided by the patient.  Alcohol Intoxication  This is a chronic problem.    Past Medical History:  Diagnosis Date  . Alcoholic hepatitis   . Depression   . ETOH abuse   . Hypertension   . Pancreatitis   . Seizures [facility])     Patient Active Problem List   Diagnosis Date Noted  . Severe recurrent major depression without psychotic features ([place]) 05/15/2017  . Anemia 03/18/2017  . Acute blood loss anemia 02/21/2017  . UGI bleed 02/08/2017  . Alcohol withdrawal ([place]) 02/08/2017  . Cocaine abuse ([place]) 11/18/2016  . GI bleed 11/01/2016  . Alcohol abuse   . Major depressive disorder, recurrent severe without psychotic features ([place]) 07/06/2016  . Alcohol-induced mood disorder ([place]) 06/15/2016  . Seizure ([place]) 05/26/2016  . Tobacco abuse 05/26/2016  . Homeless  05/26/2016  . Essential hypertension 05/26/2016  . Alcohol dependence with alcohol-induced mood disorder ([place]) 02/14/2016  . Severe alcohol withdrawal without perceptual disturbances without complication ([place]) 53/61/4431  . Alcoholic hepatitis without ascites 02/14/2016  . Thrombocytopenia ([place]) 02/14/2016    Past Surgical History:  Procedure Laterality Date  . ESOPHAGOGASTRODUODENOSCOPY (EGD) WITH PROPOFOL N/A 11/02/2016   Procedure: ESOPHAGOGASTRODUODENOSCOPY (EGD) WITH PROPOFOL;  Surgeon: Carol Ada, MD;  Location: [facility] ENDOSCOPY;  Service: Endoscopy;  Laterality: N/A;  . HERNIA REPAIR    . LEG SURGERY          Home Medications    Prior to Admission medications   Medication Sig Start Date End Date Taking? Authorizing Provider  citalopram (CELEXA) 20 MG tablet Take 20 mg by mouth daily.   Yes [provider]  gabapentin (NEURONTIN) 300 MG capsule Take 1 capsule (300 mg total) by mouth 3 (three) times daily. 06/26/17  Yes Patrecia Pour, NP  traZODone (DESYREL) 100 MG tablet Take 100 mg by mouth at bedtime.   Yes [provider]    Family History Family History  Problem Relation Age of Onset  . Diabetes Mother   . Alcoholism Mother   . Emphysema Father   . Lung cancer Father   . Alcoholism Father     Social History Social History   Tobacco Use  . Smoking status: Current Every Day Smoker    Packs/day: 1.00    Types:  Cigarettes  . Smokeless tobacco: Never Used  Substance Use Topics  . Alcohol use: Yes    Comment: 4-5 40oz beers daily  . Drug use: Yes    Frequency: 3.0 times per week    Types: Cocaine    Comment: last use today 2 weeks ago (04-09-17)      Allergies   Tomato and Aspirin   Review of Systems Review of Systems  All other systems reviewed and are negative.    Physical Exam Updated Vital Signs BP (!) 151/83   Pulse 78   Temp (!) 97.4 F (36.3 C) (Oral)   Resp 14   SpO2 96%   Physical Exam  Constitutional: He is  oriented to person, place, and time. He appears well-developed and well-nourished. No distress.  Disheveled and not well-kempt  HENT:  Head: Normocephalic and atraumatic.  Mouth/Throat: Oropharynx is clear and moist.  Eyes: Pupils are equal, round, and reactive to light. Conjunctivae and EOM are normal.  Neck: Normal range of motion. Neck supple.  Cardiovascular: Normal rate, regular rhythm and intact distal pulses.  No murmur heard. Pulmonary/Chest: Effort normal and breath sounds normal. No respiratory distress. He has no wheezes. He has no rales.  Abdominal: Soft. He exhibits no distension. There is no tenderness. There is no rebound and no guarding.  Musculoskeletal: Normal range of motion. He exhibits no edema or tenderness.  Neurological: He is alert and oriented to person, place, and time.  No evidence of tremor, tongue fasciculations or seizure-like activity at this time.  Patient is awake alert and able to answer all questions  Skin: Skin is warm and dry. No rash noted. No erythema.  Psychiatric: His behavior is normal. His affect is blunt. He exhibits a depressed mood. He expresses homicidal and suicidal ideation.  Patient states he would like to be dead but does not have a specific plan.  He is calm and cooperative  Nursing note and vitals reviewed.    ED Treatments / Results  Labs (all labs ordered are listed, but only abnormal results are displayed) Labs Reviewed  CBC WITH DIFFERENTIAL/PLATELET - Abnormal; Notable for the following components:      Result Value   WBC 2.9 (*)    Hemoglobin 10.4 (*)    HCT 33.6 (*)    MCV 72.9 (*)    MCH 22.6 (*)    RDW 19.2 (*)    Platelets 85 (*)    Neutro Abs 1.0 (*)    All other components within normal limits  I-STAT CHEM 8, ED - Abnormal; Notable for the following components:   BUN 4 (*)    Creatinine, Ser 1.30 (*)    Calcium, Ion 1.01 (*)    Hemoglobin 12.6 (*)    HCT 37.0 (*)    All other components within normal limits    RAPID URINE DRUG SCREEN, HOSP PERFORMED  ETHANOL    EKG None  Radiology No results found.  Procedures Procedures (including critical care time)  Medications Ordered in ED Medications  LORazepam (ATIVAN) injection 0-4 mg (has no administration in time range)    Or  LORazepam (ATIVAN) tablet 0-4 mg (has no administration in time range)  LORazepam (ATIVAN) injection 0-4 mg (has no administration in time range)    Or  LORazepam (ATIVAN) tablet 0-4 mg (has no administration in time range)  thiamine (VITAMIN B-1) tablet 100 mg (has no administration in time range)    Or  thiamine (B-1) injection 100 mg (has no administration  in time range)     Initial Impression / Assessment and Plan / ED Course  I have reviewed the triage vital signs and the nursing notes.  Pertinent labs & imaging results that were available during my care of the patient were reviewed by me and considered in my medical decision making (see chart for details).     52 year old male with history of polysubstance abuse single chronic depression who is presenting with feelings of hopelessness, suicidality and homicidality.  Pt is medically clear.  Patient's labs with mild elevation in creatinine today most likely related to being dehydrated.  However patient is eating and drinking here.  He has no medical complaints at this time.  Patient states that he had a seizure today but has not been taking his seizure medicine.  He has no seizure-like activity here.  Will have case management consult to see if they are able to help coordinate with the [facility] with his medications.  We will have TTS evaluate.  Final Clinical Impressions(s) / ED Diagnoses   Final diagnoses:  ETOH abuse  Depression, unspecified depression type  Suicidal thoughts    ED Discharge Orders    None       Blanchie Dessert, MD 07/15/17 2358

## 2017-07-15 NOTE — ED Notes (Signed)
Pt sleeping. Steady rise and fall of chest noted. No obvious distress.

## 2017-07-16 ENCOUNTER — Inpatient Hospital Stay (HOSPITAL_COMMUNITY)
Admission: AD | Admit: 2017-07-16 | Discharge: 2017-07-18 | DRG: 897 | Disposition: A | Discharge status: Home / Self Care | Payer: No Typology Code available for payment source | Source: Intra-hospital | Attending: Psychiatry | Admitting: Psychiatry

## 2017-07-16 ENCOUNTER — Encounter (HOSPITAL_COMMUNITY): Payer: Self-pay | Admitting: *Deleted

## 2017-07-16 ENCOUNTER — Other Ambulatory Visit: Payer: Self-pay

## 2017-07-16 DIAGNOSIS — R45851 Suicidal ideations: Secondary | ICD-10-CM | POA: Diagnosis present

## 2017-07-16 DIAGNOSIS — Z79899 Other long term (current) drug therapy: Secondary | ICD-10-CM | POA: Diagnosis not present

## 2017-07-16 DIAGNOSIS — K7010 Alcoholic hepatitis without ascites: Secondary | ICD-10-CM | POA: Diagnosis present

## 2017-07-16 DIAGNOSIS — F17210 Nicotine dependence, cigarettes, uncomplicated: Secondary | ICD-10-CM | POA: Diagnosis not present

## 2017-07-16 DIAGNOSIS — Z590 Homelessness: Secondary | ICD-10-CM | POA: Diagnosis not present

## 2017-07-16 DIAGNOSIS — F1020 Alcohol dependence, uncomplicated: Secondary | ICD-10-CM | POA: Diagnosis not present

## 2017-07-16 DIAGNOSIS — R569 Unspecified convulsions: Secondary | ICD-10-CM | POA: Diagnosis present

## 2017-07-16 DIAGNOSIS — Z833 Family history of diabetes mellitus: Secondary | ICD-10-CM

## 2017-07-16 DIAGNOSIS — I10 Essential (primary) hypertension: Secondary | ICD-10-CM | POA: Diagnosis present

## 2017-07-16 DIAGNOSIS — Y907 Blood alcohol level of 200-239 mg/100 ml: Secondary | ICD-10-CM | POA: Diagnosis not present

## 2017-07-16 DIAGNOSIS — Z811 Family history of alcohol abuse and dependence: Secondary | ICD-10-CM

## 2017-07-16 DIAGNOSIS — F17200 Nicotine dependence, unspecified, uncomplicated: Secondary | ICD-10-CM | POA: Diagnosis present

## 2017-07-16 DIAGNOSIS — Z801 Family history of malignant neoplasm of trachea, bronchus and lung: Secondary | ICD-10-CM | POA: Diagnosis not present

## 2017-07-16 DIAGNOSIS — Z9114 Patient's other noncompliance with medication regimen: Secondary | ICD-10-CM | POA: Diagnosis not present

## 2017-07-16 DIAGNOSIS — R450 Nervousness: Secondary | ICD-10-CM | POA: Diagnosis not present

## 2017-07-16 DIAGNOSIS — Z825 Family history of asthma and other chronic lower respiratory diseases: Secondary | ICD-10-CM

## 2017-07-16 DIAGNOSIS — F1994 Other psychoactive substance use, unspecified with psychoactive substance-induced mood disorder: Secondary | ICD-10-CM | POA: Diagnosis not present

## 2017-07-16 DIAGNOSIS — F419 Anxiety disorder, unspecified: Secondary | ICD-10-CM | POA: Diagnosis present

## 2017-07-16 DIAGNOSIS — F10230 Alcohol dependence with withdrawal, uncomplicated: Secondary | ICD-10-CM | POA: Diagnosis present

## 2017-07-16 DIAGNOSIS — F1024 Alcohol dependence with alcohol-induced mood disorder: Principal | ICD-10-CM | POA: Diagnosis present

## 2017-07-16 DIAGNOSIS — G4700 Insomnia, unspecified: Secondary | ICD-10-CM | POA: Diagnosis present

## 2017-07-16 DIAGNOSIS — Y908 Blood alcohol level of 240 mg/100 ml or more: Secondary | ICD-10-CM | POA: Diagnosis present

## 2017-07-16 DIAGNOSIS — Z9119 Patient's noncompliance with other medical treatment and regimen: Secondary | ICD-10-CM

## 2017-07-16 DIAGNOSIS — F332 Major depressive disorder, recurrent severe without psychotic features: Secondary | ICD-10-CM | POA: Diagnosis present

## 2017-07-16 LAB — ETHANOL: Alcohol, Ethyl (B): 314 mg/dL (ref <10)

## 2017-07-16 MED ORDER — LORAZEPAM 2 MG/ML IJ SOLN: 0.0000 mg | Freq: Two times a day (BID) | INTRAMUSCULAR | Status: DC

## 2017-07-16 MED ORDER — TRAZODONE HCL 100 MG PO TABS
ORAL_TABLET | ORAL | Status: AC
  Filled 2017-07-16: qty 1

## 2017-07-16 MED ORDER — THIAMINE HCL 100 MG/ML IJ SOLN: 100.0000 mg | Freq: Every day | INTRAMUSCULAR | Status: DC

## 2017-07-16 MED ORDER — HALOPERIDOL 5 MG PO TABS
5.0000 mg | ORAL_TABLET | Freq: Four times a day (QID) | ORAL | Status: DC | PRN
  Administered 2017-07-17: 5 mg via ORAL
  Filled 2017-07-16: qty 1

## 2017-07-16 MED ORDER — NICOTINE 21 MG/24HR TD PT24
21.0000 mg | MEDICATED_PATCH | Freq: Every day | TRANSDERMAL | Status: DC
  Administered 2017-07-17 – 2017-07-18 (×2): 21 mg via TRANSDERMAL
  Filled 2017-07-16 (×3): qty 1

## 2017-07-16 MED ORDER — LORAZEPAM 1 MG PO TABS
ORAL_TABLET | ORAL | Status: AC
  Filled 2017-07-16: qty 2

## 2017-07-16 MED ORDER — CITALOPRAM HYDROBROMIDE 20 MG PO TABS
20.0000 mg | ORAL_TABLET | Freq: Every day | ORAL | Status: DC
  Administered 2017-07-17 – 2017-07-18 (×2): 20 mg via ORAL
  Filled 2017-07-16 (×2): qty 1
  Filled 2017-07-16: qty 7
  Filled 2017-07-16: qty 1

## 2017-07-16 MED ORDER — LORAZEPAM 1 MG PO TABS
2.0000 mg | ORAL_TABLET | Freq: Once | ORAL | Status: AC
  Administered 2017-07-16: 2 mg via ORAL
  Filled 2017-07-16: qty 2

## 2017-07-16 MED ORDER — TRAZODONE HCL 100 MG PO TABS
100.0000 mg | ORAL_TABLET | Freq: Every day | ORAL | Status: DC
  Administered 2017-07-16 – 2017-07-17 (×2): 100 mg via ORAL
  Filled 2017-07-16 (×3): qty 1
  Filled 2017-07-16: qty 7

## 2017-07-16 MED ORDER — LORAZEPAM 2 MG/ML IJ SOLN: 0.0000 mg | Freq: Four times a day (QID) | INTRAMUSCULAR | Status: DC

## 2017-07-16 MED ORDER — VITAMIN B-1 100 MG PO TABS
100.0000 mg | ORAL_TABLET | Freq: Every day | ORAL | Status: DC
  Administered 2017-07-17: 100 mg via ORAL
  Filled 2017-07-16 (×2): qty 1

## 2017-07-16 MED ORDER — BENZTROPINE MESYLATE 1 MG PO TABS
1.0000 mg | ORAL_TABLET | Freq: Four times a day (QID) | ORAL | Status: DC | PRN
  Administered 2017-07-17: 1 mg via ORAL
  Filled 2017-07-16: qty 1

## 2017-07-16 MED ORDER — TRAZODONE HCL 50 MG PO TABS: 50.0000 mg | ORAL_TABLET | Freq: Every evening | ORAL | Status: DC | PRN

## 2017-07-16 MED ORDER — CITALOPRAM HYDROBROMIDE 10 MG PO TABS
20.0000 mg | ORAL_TABLET | Freq: Every day | ORAL | Status: DC
  Administered 2017-07-16: 20 mg via ORAL
  Filled 2017-07-16: qty 2

## 2017-07-16 MED ORDER — HYDROXYZINE HCL 25 MG PO TABS: 25.0000 mg | ORAL_TABLET | Freq: Three times a day (TID) | ORAL | Status: DC | PRN

## 2017-07-16 MED ORDER — MAGNESIUM HYDROXIDE 400 MG/5ML PO SUSP: 30.0000 mL | Freq: Every day | ORAL | Status: DC | PRN

## 2017-07-16 MED ORDER — GABAPENTIN 300 MG PO CAPS
300.0000 mg | ORAL_CAPSULE | Freq: Three times a day (TID) | ORAL | Status: DC
  Administered 2017-07-17 – 2017-07-18 (×4): 300 mg via ORAL
  Filled 2017-07-16 (×2): qty 1
  Filled 2017-07-16: qty 21
  Filled 2017-07-16 (×3): qty 1
  Filled 2017-07-16: qty 21
  Filled 2017-07-16: qty 1
  Filled 2017-07-16: qty 21
  Filled 2017-07-16: qty 1

## 2017-07-16 MED ORDER — LORAZEPAM 1 MG PO TABS
0.0000 mg | ORAL_TABLET | Freq: Four times a day (QID) | ORAL | Status: DC
  Administered 2017-07-16: 2 mg via ORAL
  Administered 2017-07-17: 1 mg via ORAL
  Filled 2017-07-16: qty 1

## 2017-07-16 MED ORDER — TRAZODONE HCL 100 MG PO TABS: 100.0000 mg | ORAL_TABLET | Freq: Every day | ORAL | Status: DC

## 2017-07-16 MED ORDER — GABAPENTIN 300 MG PO CAPS
300.0000 mg | ORAL_CAPSULE | Freq: Three times a day (TID) | ORAL | Status: DC
Start: 2017-07-16 — End: 2017-07-16
  Administered 2017-07-16 (×2): 300 mg via ORAL
  Filled 2017-07-16 (×2): qty 1

## 2017-07-16 MED ORDER — ACETAMINOPHEN 325 MG PO TABS
650.0000 mg | ORAL_TABLET | Freq: Four times a day (QID) | ORAL | Status: DC | PRN
  Administered 2017-07-17: 650 mg via ORAL
  Filled 2017-07-16: qty 2

## 2017-07-16 MED ORDER — ENSURE ENLIVE PO LIQD
237.0000 mL | Freq: Two times a day (BID) | ORAL | Status: DC
  Administered 2017-07-17 (×2): 237 mL via ORAL

## 2017-07-16 MED ORDER — ALUM & MAG HYDROXIDE-SIMETH 200-200-20 MG/5ML PO SUSP: 30.0000 mL | ORAL | Status: DC | PRN

## 2017-07-16 MED ORDER — LORAZEPAM 1 MG PO TABS: 0.0000 mg | ORAL_TABLET | Freq: Two times a day (BID) | ORAL | Status: DC

## 2017-07-16 NOTE — Progress Notes (Signed)
Barry Moore is a 52 year old male pt admitted on voluntary basis. On admission, [name] presents as tremulous and spoke about how he has been drinking on a daily basis. He does endorse depression but denies SI currently and is able to contract for safety while in the hospital. He reports that he has not been taking his medications like he should and reports that he was unable to follow-up with [facility] when he left last admission. He reports that he is homeless and lives in a tent and reports that is where he will go once he is discharged as he reports he has no other choice. Quantae was oriented to the unit and safety maintained.

## 2017-07-16 NOTE — ED Notes (Signed)
Bed: WA29 Expected date:  Expected time:  Means of arrival:  Comments: Room 8

## 2017-07-16 NOTE — ED Notes (Signed)
[facility] called for transport. 

## 2017-07-16 NOTE — ED Notes (Signed)
Pt admitted to room #43. Pt denies SI/HI/AVH. Pt presents with obvious s/s of withdrawal, this nurse notified Laurie,NP awaiting orders. Encouragement and support provided. Special checks q 15 mins in place for safety, Video monitoring in place. Will continue to monitor.
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
## 2017-07-16 NOTE — Tx Team (Signed)
Initial Treatment Plan 07/16/2017 10:06 PM Barry Moore PJK:[id number]    PATIENT STRESSORS: Medication change or noncompliance Substance abuse   PATIENT STRENGTHS: Ability for insight Average or above average intelligence Capable of independent living General fund of knowledge   PATIENT IDENTIFIED PROBLEMS: Depression Suicidal thoughts Alcohol Abuse "Need to get detoxed and back on my medicine"                     DISCHARGE CRITERIA:  Ability to meet basic life and health needs Improved stabilization in mood, thinking, and/or behavior Reduction of life-threatening or endangering symptoms to within safe limits Verbal commitment to aftercare and medication compliance Withdrawal symptoms are absent or subacute and managed without 24-hour nursing intervention  PRELIMINARY DISCHARGE PLAN: Attend aftercare/continuing care group  PATIENT/FAMILY INVOLVEMENT: This treatment plan has been presented to and reviewed with the patient, Barry Moore, and/or family member, .  The patient and family have been given the opportunity to ask questions and make suggestions.  Bashar Milam, [place], [place] 07/16/2017, 10:06 PM

## 2017-07-16 NOTE — ED Notes (Signed)
SBAR Report received from previous nurse. Pt received calm and visible on unit. Pt asleep and unable to participate with assessment of current SI/ HI, A/V H, depression, anxiety, or pain at this time, and appears otherwise stable and free of distress. Pt reminded of camera surveillance, q 15 min rounds, and rules of the milieu. Will continue to assess.

## 2017-07-16 NOTE — Progress Notes (Signed)
Patient ID: Barry Moore, male   DOB: 11-13-1965, 52 y.o.   MRN: [id number]  Dr Darleene Cleaver and Jinny Blossom, NP have determined that patient would benefit from an inpatient psychiatric admission for crisis stabilization anad medication management.   Ethelene Hal, NP-C 07-16-2017     1150

## 2017-07-16 NOTE — ED Notes (Signed)
Bed: [facility] Expected date:  Expected time:  Means of arrival:  Comments: 47

## 2017-07-16 NOTE — BHH Counselor (Signed)
Patient accepted to [facility] 305-1 and can arrive at 8pm

## 2017-07-17 DIAGNOSIS — F17210 Nicotine dependence, cigarettes, uncomplicated: Secondary | ICD-10-CM

## 2017-07-17 DIAGNOSIS — R450 Nervousness: Secondary | ICD-10-CM

## 2017-07-17 DIAGNOSIS — F1994 Other psychoactive substance use, unspecified with psychoactive substance-induced mood disorder: Secondary | ICD-10-CM

## 2017-07-17 DIAGNOSIS — Z811 Family history of alcohol abuse and dependence: Secondary | ICD-10-CM

## 2017-07-17 DIAGNOSIS — F419 Anxiety disorder, unspecified: Secondary | ICD-10-CM

## 2017-07-17 DIAGNOSIS — Y907 Blood alcohol level of 200-239 mg/100 ml: Secondary | ICD-10-CM

## 2017-07-17 DIAGNOSIS — R45851 Suicidal ideations: Secondary | ICD-10-CM

## 2017-07-17 DIAGNOSIS — F1020 Alcohol dependence, uncomplicated: Secondary | ICD-10-CM

## 2017-07-17 LAB — COMPREHENSIVE METABOLIC PANEL
ALT: 77 U/L — AB (ref 17–63)
AST: 193 U/L — ABNORMAL HIGH (ref 15–41)
Albumin: 4.1 g/dL (ref 3.5–5.0)
Alkaline Phosphatase: 122 U/L (ref 38–126)
Anion gap: 10 (ref 5–15)
BUN: 12 mg/dL (ref 6–20)
CHLORIDE: 99 mmol/L — AB (ref 101–111)
CO2: 26 mmol/L (ref 22–32)
CREATININE: 0.99 mg/dL (ref 0.61–1.24)
Calcium: 10.5 mg/dL — ABNORMAL HIGH (ref 8.9–10.3)
Glucose, Bld: 110 mg/dL — ABNORMAL HIGH (ref 65–99)
Potassium: 4 mmol/L (ref 3.5–5.1)
Sodium: 135 mmol/L (ref 135–145)
Total Bilirubin: 0.6 mg/dL (ref 0.3–1.2)
Total Protein: 7.8 g/dL (ref 6.5–8.1)

## 2017-07-17 MED ORDER — NALTREXONE HCL 50 MG PO TABS
50.0000 mg | ORAL_TABLET | Freq: Every day | ORAL | Status: DC
  Administered 2017-07-17 – 2017-07-18 (×2): 50 mg via ORAL
  Filled 2017-07-17 (×3): qty 1
  Filled 2017-07-17: qty 7

## 2017-07-17 MED ORDER — ONDANSETRON 4 MG PO TBDP: 4.0000 mg | ORAL_TABLET | Freq: Four times a day (QID) | ORAL | Status: DC | PRN

## 2017-07-17 MED ORDER — VITAMIN B-1 100 MG PO TABS
100.0000 mg | ORAL_TABLET | Freq: Every day | ORAL | Status: DC
  Administered 2017-07-18: 100 mg via ORAL
  Filled 2017-07-17 (×2): qty 1

## 2017-07-17 MED ORDER — LORAZEPAM 1 MG PO TABS
1.0000 mg | ORAL_TABLET | Freq: Three times a day (TID) | ORAL | Status: DC
  Administered 2017-07-18: 1 mg via ORAL
  Filled 2017-07-17: qty 1

## 2017-07-17 MED ORDER — LOPERAMIDE HCL 2 MG PO CAPS: 2.0000 mg | ORAL_CAPSULE | ORAL | Status: DC | PRN

## 2017-07-17 MED ORDER — LORAZEPAM 1 MG PO TABS: 1.0000 mg | ORAL_TABLET | Freq: Every day | ORAL | Status: DC

## 2017-07-17 MED ORDER — ADULT MULTIVITAMIN W/MINERALS CH
1.0000 | ORAL_TABLET | Freq: Every day | ORAL | Status: DC
  Administered 2017-07-17 – 2017-07-18 (×2): 1 via ORAL
  Filled 2017-07-17 (×3): qty 1

## 2017-07-17 MED ORDER — LORAZEPAM 1 MG PO TABS: 1.0000 mg | ORAL_TABLET | Freq: Two times a day (BID) | ORAL | Status: DC

## 2017-07-17 MED ORDER — LORAZEPAM 1 MG PO TABS: 1.0000 mg | ORAL_TABLET | Freq: Four times a day (QID) | ORAL | Status: DC | PRN

## 2017-07-17 MED ORDER — HYDROXYZINE HCL 25 MG PO TABS: 25.0000 mg | ORAL_TABLET | Freq: Four times a day (QID) | ORAL | Status: DC | PRN

## 2017-07-17 MED ORDER — LORAZEPAM 1 MG PO TABS
1.0000 mg | ORAL_TABLET | Freq: Four times a day (QID) | ORAL | Status: AC
  Administered 2017-07-17 (×4): 1 mg via ORAL
  Filled 2017-07-17 (×4): qty 1

## 2017-07-17 NOTE — Progress Notes (Signed)
D:  Patient's self inventory sheet, patient has fair sleep, no sleep medication given.  Poor appetite, low energy level, poor concentration.  Rated depression, hopeless and anxiety 9.  Withdrawals, tremors, chilling, irritability.  Denied SI.  Physical problems, lightheaded, dizziness, blurred vision. Physical pain, worst pain in past 24 hours is #8, lower back.  No pain medicine.  Goal is "gt over D.T."  Plans to think about what to do.  No discharge plans. A:  Medications administered per MD orders.  Emotional support and encouragement given patient. R:  Denied SI and HI while talking to nurse this morning.  Denied A/V hallucinations.  Safety maintained with 15 minute checks. Patient given haldol, cogentin, tylenol prn this morning.

## 2017-07-17 NOTE — BHH Suicide Risk Assessment (Signed)
[place] INPATIENT:  Family/Significant Other Suicide Prevention Education  Suicide Prevention Education:  Patient Refusal for Family/Significant Other Suicide Prevention Education: The patient Barry Moore has refused to provide written consent for family/significant other to be provided Family/Significant Other Suicide Prevention Education during admission and/or prior to discharge.  Physician notified.  Joanne Chars, LCSW 07/17/2017, 12:41 PM

## 2017-07-17 NOTE — Plan of Care (Signed)
Nurse discussed anxiety, depression and coping skills with patient.  

## 2017-07-17 NOTE — [facility] Suicide Risk Assessment (Signed)
[facility] Admission Suicide Risk Assessment   Nursing information obtained from:    Demographic factors:    Current Mental Status:    Loss Factors:    Historical Factors:    Risk Reduction Factors:     Total Time spent with patient: 45 minutes Principal Problem: <principal problem not specified> Diagnosis:   Patient Active Problem List   Diagnosis Date Noted  . Substance induced mood disorder ([place]) [F19.94] 07/16/2017  . Severe recurrent major depression without psychotic features ([place]) [F33.2] 05/15/2017  . Anemia [D64.9] 03/18/2017  . Acute blood loss anemia [D62] 02/21/2017  . UGI bleed [K92.2] 02/08/2017  . Alcohol withdrawal ([place]) [F10.239] 02/08/2017  . Cocaine abuse ([place]) [F14.10] 11/18/2016  . GI bleed [K92.2] 11/01/2016  . Alcohol abuse [F10.10]   . Major depressive disorder, recurrent severe without psychotic features ([place]) [F33.2] 07/06/2016  . Alcohol-induced mood disorder ([place]) [F10.94] 06/15/2016  . Seizure ([place]) [R56.9] 05/26/2016  . Tobacco abuse [Z72.0] 05/26/2016  . Homeless [Z59.0] 05/26/2016  . Essential hypertension [I10] 05/26/2016  . Alcohol dependence with alcohol-induced mood disorder ([place]) [F10.24] 02/14/2016  . Severe alcohol withdrawal without perceptual disturbances without complication ([place]) [L93.790] 02/14/2016  . Alcoholic hepatitis without ascites [K70.10] 02/14/2016  . Thrombocytopenia ([place]) [D69.6] 02/14/2016   Subjective Data: Patient is seen and examined.  Patient is a 52 year old male with a past psychiatric history significant for alcohol dependence who presented with what was called suicidal ideation.  The patient is well-known to the [facility] emergency department.  Review of the chart shows that he has had over 20 ER visits since 2018.  It appears as though he just got out of some facility in March.  Patient stated he wanted to get back on his medications.  This included Celexa and Klonopin.  He has been admitted on multiple  occasions.  His blood alcohol was 314 on arrival in the emergency room.  He drinks as much as he can every day.  He is essentially homeless and [place] the woods.  He stated he had no transportation to be able to get to [facility] to continue his medications.  He was admitted to the hospital for detox.  He did state that he had a history of alcohol related withdrawal seizures.  Continued Clinical Symptoms:  Alcohol Use Disorder Identification Test Final Score (AUDIT): 40 The "Alcohol Use Disorders Identification Test", Guidelines for Use in Primary Care, Second Edition.  World Pharmacologist [facility]). Score between 0-7:  no or low risk or alcohol related problems. Score between 8-15:  moderate risk of alcohol related problems. Score between 16-19:  high risk of alcohol related problems. Score 20 or above:  warrants further diagnostic evaluation for alcohol dependence and treatment.   CLINICAL FACTORS:   Alcohol/Substance Abuse/Dependencies   Musculoskeletal: Strength & Muscle Tone: decreased Gait & Station: unsteady Patient leans: N/A  Psychiatric Specialty Exam: Physical Exam  Nursing note and vitals reviewed. Constitutional: He is oriented to person, place, and time.  Respiratory: Effort normal.  Neurological: He is alert and oriented to person, place, and time.    ROS  Blood pressure 121/83, pulse (!) 111, temperature 98.5 F (36.9 C), temperature source Oral, resp. rate 18, height 5\' 7"  (1.702 m), weight 79.4 kg (175 lb).Body mass index is 27.41 kg/m.  General Appearance: Disheveled  Eye Contact:  Poor  Speech:  Slow  Volume:  Decreased  Mood:  Dysphoric  Affect:  Flat  Thought Process:  Linear  Orientation:  Full (Time,  Place, and Person)  Thought Content:  Logical  Suicidal Thoughts:  Yes.  without intent/plan  Homicidal Thoughts:  No  Memory:  Immediate;   Fair  Judgement:  Impaired  Insight:  Lacking  Psychomotor Activity:  Tremor  Concentration:  Concentration:  Poor  Recall:  [employer] of Knowledge:  Fair  Language:  Fair  Akathisia:  No  Handed:  Right  AIMS (if indicated):     Assets:  Resilience  ADL's:  Intact  Cognition:  WNL  Sleep:  Number of Hours: 6.5      COGNITIVE FEATURES THAT CONTRIBUTE TO RISK:  Thought constriction (tunnel vision)    SUICIDE RISK:   Minimal: No identifiable suicidal ideation.  Patients presenting with no risk factors but with morbid ruminations; may be classified as minimal risk based on the severity of the depressive symptoms  PLAN OF CARE: Patient seen and examined.  Patient's 52 year old male with a past psychiatric history significant for alcohol dependence.  He was admitted to the hospital for detox.  We will restart the Celexa, but I am not sure that is of any benefit.  We will put him on a benzodiazepine withdrawal taper.  He will be watch for seizure precautions.  He will be monitored every 15 minutes for suicidal thoughts as well as alcohol withdrawal.  We will have to discuss some options for this gentleman after he is detoxed for some kind of disposition that we can decrease his emergency room visits and utilization of the healthcare system.  I certify that inpatient services furnished can reasonably be expected to improve the patient's condition.   Sharma Covert, MD 07/17/2017, 8:21 AM

## 2017-07-17 NOTE — Progress Notes (Signed)
Pt attended group and participated.
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
## 2017-07-17 NOTE — Progress Notes (Signed)
D   Pt depressed and sad   He reports some withdrawal symptoms including anxiety and tremors and irritability    His behavior is appropriate and his interactions are minimal but he is visible on the milieu A    Verbal support given   Medications administered and effectiveness monitored   Q 15 min checks  R   Pt is safe at present time
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
## 2017-07-18 MED ORDER — TRAZODONE HCL 100 MG PO TABS: 100.0000 mg | ORAL_TABLET | Freq: Every day | ORAL | 0 refills | Status: DC

## 2017-07-18 MED ORDER — GABAPENTIN 300 MG PO CAPS: 300.0000 mg | ORAL_CAPSULE | Freq: Three times a day (TID) | ORAL | 0 refills | Status: DC

## 2017-07-18 MED ORDER — CITALOPRAM HYDROBROMIDE 20 MG PO TABS: 20.0000 mg | ORAL_TABLET | Freq: Every day | ORAL | 0 refills | Status: DC

## 2017-07-18 MED ORDER — NALTREXONE HCL 50 MG PO TABS: 50.0000 mg | ORAL_TABLET | Freq: Every day | ORAL | 0 refills | Status: DC

## 2017-07-18 NOTE — Discharge Summary (Signed)
Physician Discharge Summary Note  Patient:  Barry Moore is an 52 y.o., male MRN:  [id number] DOB:  1965/10/26 Patient phone:  [phone] (home)  Patient address:   [place] [address],  Total Time spent with patient: 20 minutes  Date of Admission:  07/16/2017 Date of Discharge: 07/18/17  Reason for Admission:  Alcohol dependence and SI  Principal Problem: Substance induced mood disorder [facility]) Discharge Diagnoses: Patient Active Problem List   Diagnosis Date Noted  . Substance induced mood disorder ([place]) [F19.94] 07/16/2017  . Severe recurrent major depression without psychotic features ([place]) [F33.2] 05/15/2017  . Anemia [D64.9] 03/18/2017  . Acute blood loss anemia [D62] 02/21/2017  . UGI bleed [K92.2] 02/08/2017  . Alcohol withdrawal ([place]) [F10.239] 02/08/2017  . Cocaine abuse ([place]) [F14.10] 11/18/2016  . GI bleed [K92.2] 11/01/2016  . Alcohol abuse [F10.10]   . Major depressive disorder, recurrent severe without psychotic features ([place]) [F33.2] 07/06/2016  . Alcohol-induced mood disorder ([place]) [F10.94] 06/15/2016  . Seizure ([place]) [R56.9] 05/26/2016  . Tobacco abuse [Z72.0] 05/26/2016  . Homeless [Z59.0] 05/26/2016  . Essential hypertension [I10] 05/26/2016  . Alcohol dependence with alcohol-induced mood disorder ([place]) [F10.24] 02/14/2016  . Severe alcohol withdrawal without perceptual disturbances without complication ([place]) [Z02.585] 02/14/2016  . Alcoholic hepatitis without ascites [K70.10] 02/14/2016  . Thrombocytopenia ([place]) [D69.6] 02/14/2016    Past Psychiatric History: The patient is well-known to the [facility] emergency department. Review of the chart shows that he has had over 20 ER visits since 2018. [facility] once in the past. Denies suicide attampts  Past Medical History:  Past Medical History:  Diagnosis Date  . Alcoholic hepatitis   . Depression   . ETOH abuse   . Hypertension   . Pancreatitis   . Seizures ([place])     Past  Surgical History:  Procedure Laterality Date  . ESOPHAGOGASTRODUODENOSCOPY (EGD) WITH PROPOFOL N/A 11/02/2016   Procedure: ESOPHAGOGASTRODUODENOSCOPY (EGD) WITH PROPOFOL;  Surgeon: Carol Ada, MD;  Location: [facility] ENDOSCOPY;  Service: Endoscopy;  Laterality: N/A;  . HERNIA REPAIR    . LEG SURGERY     Family History:  Family History  Problem Relation Age of Onset  . Diabetes Mother   . Alcoholism Mother   . Emphysema Father   . Lung cancer Father   . Alcoholism Father    Family Psychiatric  History: Denies Social History:  Social History   Substance and Sexual Activity  Alcohol Use Yes   Comment: 4-5 40oz beers daily     Social History   Substance and Sexual Activity  Drug Use Yes  . Frequency: 3.0 times per week  . Types: Cocaine   Comment: last use today 2 weeks ago (04-09-17)     Social History   Socioeconomic History  . Marital status: Divorced    Spouse name: Not on file  . Number of children: Not on file  . Years of education: Not on file  . Highest education level: Not on file  Occupational History  . Not on file  Social Needs  . Financial resource strain: Not on file  . Food insecurity:    Worry: Not on file    Inability: Not on file  . Transportation needs:    Medical: Not on file    Non-medical: Not on file  Tobacco Use  . Smoking status: Current Every Day Smoker    Packs/day: 1.00    Types: Cigarettes  . Smokeless tobacco: Never Used  Substance and Sexual Activity  .  Alcohol use: Yes    Comment: 4-5 40oz beers daily  . Drug use: Yes    Frequency: 3.0 times per week    Types: Cocaine    Comment: last use today 2 weeks ago (04-09-17)   . Sexual activity: Not Currently  Lifestyle  . Physical activity:    Days per week: Not on file    Minutes per session: Not on file  . Stress: Not on file  Relationships  . Social connections:    Talks on phone: Not on file    Gets together: Not on file    Attends religious service: Not on file    Active  member of club or organization: Not on file    Attends meetings of clubs or organizations: Not on file    Relationship status: Not on file  Other Topics Concern  . Not on file  Social History Narrative  . Not on file    Hospital Course:   07/16/17 [facility] Counselor Assessment: 52 y.o.malewho presents to the ED voluntarily due to alcohol dependence and suicidal thoughts.Pt is well known to this ED and has 23 ED visits in the past 6 months. Pt often presents to the ED due to alcohol dependence and suicidal thoughts. Pt has been consulted by TTS on 06/25/17, 06/18/17, 05/21/17, 05/14/17, 05/06/17, 04/27/17 and per chart, has been given resources during his visits to the ED. Pt does not follow up with any OPT providers. Pt has met with several peer support specialist while in the ED in order to assist with substance abuse issues. Pt often presents to the ED with a BAL in the 400s. During the pt's previous 2 ED visits the pt's BAL was over 400. Pt's BAL is currently 314 on arrival to the ED. Pt states he drinks as much as he can everyday. Pt states he is homeless and sleeps in the woods. Pt states he has thoughts of drinking himself to death. Pt endorses AH and when asked what he hears he states "it's nothing serious." Pt endorses HI towards a person that he believes "has a smart ass mouth and thinks that he is all that." Pt denies any other stressors or complaints at this time.   07/17/17 [place] MD Assessment: Patient is seen and evaluated today and he is pleasant and cooperative.  He confirms the above information.  Patient also adds that he drinks approximately 640 ounce beers a day, but denies any other substance use.  He reports that his biggest issue is that after he leaves from the hospital he will stop his antidepressants voluntarily, started drinking alcohol again, and then become very depressed due to his living arrangements and being homeless and living in a tent.  However, patient does not want to do  any residential treatments, "it makes me feel like I am trapped."  Patient has had numerous ED visits and patient is noncompliant with his medications and treatments after discharge.  Recommended the patient to seek residential treatment and he stated that he would think about it.  Requested patient did mention that tomorrow and to continue his medications and follow-up at his outpatient providers after discharge. Patient seems to be he is in the hospital secondary gain and is not made any progression towards seeking further treatment other than detox at the hospital.  Patient remained on the [facility] unit for 2 days. The patient stabilized on medication and therapy. Patient was discharged on Celexa 20 mg Daily, Neurontin 300 mg TID, Naltrexone 50 mg Daily,  and Trazodone 100 mg QHS. Patient has shown improvement with improved mood, affect, sleep, appetite, and interaction. Patient has attended group and participated. Patient has been seen in the day room interacting with peers and staff appropriately. Patient denies any SI/HI/AVH and contracts for safety. Patient agrees to follow up at [facility]. Patient is provided with prescriptions for their medications upon discharge.    Physical Findings: AIMS: Facial and Oral Movements Muscles of Facial Expression: None, normal Lips and Perioral Area: None, normal Jaw: None, normal Tongue: None, normal,Extremity Movements Upper (arms, wrists, hands, fingers): None, normal Lower (legs, knees, ankles, toes): None, normal, Trunk Movements Neck, shoulders, hips: None, normal, Overall Severity Severity of abnormal movements (highest score from questions above): None, normal Incapacitation due to abnormal movements: None, normal Patient's awareness of abnormal movements (rate only patient's report): No Awareness, Dental Status Current problems with teeth and/or dentures?: No Does patient usually wear dentures?: No  CIWA:  CIWA-Ar Total: 2 COWS:  COWS Total Score:  4  Musculoskeletal: Strength & Muscle Tone: within normal limits Gait & Station: normal Patient leans: N/A  Psychiatric Specialty Exam: Physical Exam  Nursing note and vitals reviewed. Constitutional: He is oriented to person, place, and time. He appears well-developed and well-nourished.  Respiratory: Effort normal.  Musculoskeletal: Normal range of motion.  Neurological: He is alert and oriented to person, place, and time.  Skin: Skin is warm.    Review of Systems  Constitutional: Negative.   HENT: Negative.   Eyes: Negative.   Respiratory: Negative.   Cardiovascular: Negative.   Gastrointestinal: Negative.   Genitourinary: Negative.   Musculoskeletal: Negative.   Skin: Negative.   Neurological: Negative.   Endo/Heme/Allergies: Negative.   Psychiatric/Behavioral: Negative.     Blood pressure (!) 157/98, pulse 95, temperature 98 F (36.7 C), temperature source Oral, resp. rate 18, height 5' 7"  (1.702 m), weight 79.4 kg (175 lb).Body mass index is 27.41 kg/m.  General Appearance: Casual  Eye Contact:  Good  Speech:  Clear and Coherent and Normal Rate  Volume:  Normal  Mood:  Euthymic  Affect:  Appropriate  Thought Process:  Goal Directed and Descriptions of Associations: Intact  Orientation:  Full (Time, Place, and Person)  Thought Content:  WDL  Suicidal Thoughts:  No  Homicidal Thoughts:  No  Memory:  Immediate;   Good Recent;   Good Remote;   Good  Judgement:  Fair  Insight:  Fair  Psychomotor Activity:  Normal  Concentration:  Concentration: Good and Attention Span: Good  Recall:  Good  Fund of Knowledge:  Good  Language:  Good  Akathisia:  No  Handed:  Right  AIMS (if indicated):     Assets:  Communication Skills Desire for Improvement Physical Health Social Support Transportation  ADL's:  Intact  Cognition:  WNL  Sleep:  Number of Hours: 6.5     Have you used any form of tobacco in the last 30 days? (Cigarettes, Smokeless Tobacco, Cigars,  and/or Pipes): Yes  Has this patient used any form of tobacco in the last 30 days? (Cigarettes, Smokeless Tobacco, Cigars, and/or Pipes) Yes, Yes, A prescription for an FDA-approved tobacco cessation medication was offered at discharge and the patient refused  Blood Alcohol level:  Lab Results  Component Value Date   ETH 314 ([place]) 07/15/2017   ETH 404 ([place]) 73/22/0254    Metabolic Disorder Labs:  Lab Results  Component Value Date   HGBA1C 5.2 03/07/2016   MPG 103 03/07/2016   Lab Results  Component Value Date   PROLACTIN 23.2 (H) 03/07/2016   Lab Results  Component Value Date   CHOL 215 (H) 03/07/2016   TRIG 82 03/07/2016   HDL 105 03/07/2016   CHOLHDL 2.0 03/07/2016   VLDL 16 03/07/2016   LDLCALC 94 03/07/2016    See Psychiatric Specialty Exam and Suicide Risk Assessment completed by Attending Physician prior to discharge.  Discharge destination:  Home  Is patient on multiple antipsychotic therapies at discharge:  No   Has Patient had three or more failed trials of antipsychotic monotherapy by history:  No  Recommended Plan for Multiple Antipsychotic Therapies: NA   Allergies as of 07/18/2017      Reactions   Tomato Shortness Of Breath, Nausea And Vomiting   Aspirin Nausea And Vomiting      Medication List    TAKE these medications     Indication  citalopram 20 MG tablet Commonly known as:  CELEXA Take 1 tablet (20 mg total) by mouth daily. For mood control What changed:  additional instructions  Indication:  mood stability   gabapentin 300 MG capsule Commonly known as:  NEURONTIN Take 1 capsule (300 mg total) by mouth 3 (three) times daily. For withdrawal symptoms What changed:  additional instructions  Indication:  Abuse or Misuse of Alcohol, Alcohol Withdrawal Syndrome   naltrexone 50 MG tablet Commonly known as:  DEPADE Take 1 tablet (50 mg total) by mouth daily. For cravings Start taking on:  07/19/2017  Indication:  Excessive Use of Alcohol    traZODone 100 MG tablet Commonly known as:  DESYREL Take 1 tablet (100 mg total) by mouth at bedtime. For insomnia What changed:  additional instructions  Indication:  Trouble Sleeping      Follow-up Information    [facility] Follow up on 07/21/2017.   Specialty:  Behavioral Health Why:  Hospital follow-up on Thursday, 5/2 at 8:00AM. Please bring photo ID and hospital discharge paperwork. Thank you.  Contact information: [address] [place] [address] [phone]           Follow-up recommendations:  Continue activity as tolerated. Continue diet as recommended by your PCP. Ensure to keep all appointments with outpatient providers.  Comments:  Patient is instructed prior to discharge to: Take all medications as prescribed by his/her mental healthcare provider. Report any adverse effects and or reactions from the medicines to his/her outpatient provider promptly. Patient has been instructed & cautioned: To not engage in alcohol and or illegal drug use while on prescription medicines. In the event of worsening symptoms, patient is instructed to call the crisis hotline, 911 and or go to the nearest ED for appropriate evaluation and treatment of symptoms. To follow-up with his/her primary care provider for your other medical issues, concerns and or health care needs.    Signed: Lowry Ram Money, FNP 07/18/2017, 2:59 PM

## 2017-07-18 NOTE — BHH Suicide Risk Assessment (Signed)
[facility] Discharge Suicide Risk Assessment   Principal Problem: Substance induced mood disorder [facility]) Discharge Diagnoses:  Patient Active Problem List   Diagnosis Date Noted  . Substance induced mood disorder ([place]) [F19.94] 07/16/2017  . Severe recurrent major depression without psychotic features ([place]) [F33.2] 05/15/2017  . Anemia [D64.9] 03/18/2017  . Acute blood loss anemia [D62] 02/21/2017  . UGI bleed [K92.2] 02/08/2017  . Alcohol withdrawal ([place]) [F10.239] 02/08/2017  . Cocaine abuse ([place]) [F14.10] 11/18/2016  . GI bleed [K92.2] 11/01/2016  . Alcohol abuse [F10.10]   . Major depressive disorder, recurrent severe without psychotic features ([place]) [F33.2] 07/06/2016  . Alcohol-induced mood disorder ([place]) [F10.94] 06/15/2016  . Seizure ([place]) [R56.9] 05/26/2016  . Tobacco abuse [Z72.0] 05/26/2016  . Homeless [Z59.0] 05/26/2016  . Essential hypertension [I10] 05/26/2016  . Alcohol dependence with alcohol-induced mood disorder ([place]) [F10.24] 02/14/2016  . Severe alcohol withdrawal without perceptual disturbances without complication ([place]) [M35.361] 02/14/2016  . Alcoholic hepatitis without ascites [K70.10] 02/14/2016  . Thrombocytopenia ([place]) [D69.6] 02/14/2016    Total Time spent with patient: 30 minutes  Musculoskeletal: Strength & Muscle Tone: within normal limits Gait & Station: shuffle Patient leans: N/A  Psychiatric Specialty Exam: Review of Systems  Neurological: Positive for tremors.    Blood pressure 126/82, pulse (!) 117, temperature 98 F (36.7 C), temperature source Oral, resp. rate 18, height 5\' 7"  (1.702 m), weight 79.4 kg (175 lb).Body mass index is 27.41 kg/m.  General Appearance: Casual  Eye Contact::  Fair  Speech:  Normal Rate409  Volume:  Normal  Mood:  Anxious  Affect:  Appropriate  Thought Process:  Coherent  Orientation:  Full (Time, Place, and Person)  Thought Content:  Logical  Suicidal Thoughts:  No  Homicidal Thoughts:  No  Memory:   Immediate;   Fair  Judgement:  Intact  Insight:  Fair  Psychomotor Activity:  Tremor  Concentration:  Fair  Recall:  [employer] of Knowledge:Fair  Language: Fair  Akathisia:  No  Handed:  Right  AIMS (if indicated):     Assets:  Desire for Improvement Resilience  Sleep:  Number of Hours: 6.5  Cognition: WNL  ADL's:  Intact   Mental Status Per Nursing Assessment::   On Admission:     Demographic Factors:  Male, Divorced or widowed, Caucasian and Low socioeconomic status  Loss Factors: Financial problems/change in socioeconomic status  Historical Factors: Prior suicide attempts and Impulsivity  Risk Reduction Factors:   Positive coping skills or problem solving skills  Continued Clinical Symptoms:  Alcohol/Substance Abuse/Dependencies  Cognitive Features That Contribute To Risk:  None    Suicide Risk:  Minimal: No identifiable suicidal ideation.  Patients presenting with no risk factors but with morbid ruminations; may be classified as minimal risk based on the severity of the depressive symptoms    Plan Of Care/Follow-up recommendations:  Activity:  ad lib  Sharma Covert, MD 07/18/2017, 8:10 AM

## 2017-07-18 NOTE — Progress Notes (Signed)
D:  Patient denied SI and HI, contracts for safety.  Denied A/V hallucinations.   A:  Medications administered per MD orders.  Emotional support and encouragement given patient. R:  Safety maintained with 15 minute checks.  

## 2017-07-18 NOTE — Progress Notes (Addendum)
  [facility] Adult Case Management Discharge Plan :  Will you be returning to the same living situation after discharge:  Yes,  home/tent in woods At discharge, do you have transportation home?: Yes,  bus pass Do you have the ability to pay for your medications: Yes,  mental health  Release of information consent forms completed and submitted to medical records by CSW.  Patient to Follow up at: Follow-up Information    [facility] Follow up on 07/21/2017.   Specialty:  Behavioral Health Why:  Hospital follow-up on Thursday, 5/2 at 8:00AM. Please bring photo ID and hospital discharge paperwork. Thank you.  Contact information: [place] [address] [phone]           Next level of care provider has access to [place] and Suicide Prevention discussed: Yes,  SPE completed with pt; pt declined to consent to collateral contact. SPI pamphlet and mobile crisis information provided to pt.   Have you used any form of tobacco in the last 30 days? (Cigarettes, Smokeless Tobacco, Cigars, and/or Pipes): Yes  Has patient been referred to the Quitline?: Patient refused referral  Patient has been referred for addiction treatment: Yes  [employer], LCSW 07/18/2017, 10:28 AM

## 2017-07-18 NOTE — Progress Notes (Signed)
Discharge Note:  Patient discharged.  Patient denied SI and HI.  Denied A/V hallucinations.  Denied pain.  Suicide prevention information given and discussed with patient who stated he understood and had no questions.  Patient stated he received all his belongings, shoes, clothing, wallet, toiletries, misc items, prescriptions, medications, etc.  Patient stated he appreciated all assistance received from [facility] staff.  All required discharge information given to patient at discharge.

## 2017-07-18 NOTE — Progress Notes (Signed)
Recreation Therapy Notes  Date: 4.29.19 Time: 0930 Location: 300 [address] Dayroom  Group Topic: Stress Management  Goal Area(s) Addresses:  Patient will verbalize importance of using healthy stress management.  Patient will identify positive emotions associated with healthy stress management.   Intervention: Stress Management  Activity :  Body Scan Meditation.  LRT introduced the stress management technique of meditation to patients.  LRT played a meditation that focused on identifying any feeling of tension and stress in the body.  Patients were to follow along as the meditation played.  Education:  Stress Management, Discharge Planning.   Education Outcome: Acknowledges edcuation/In group clarification offered/Needs additional education  Clinical Observations/Feedback: Pt did not attend group.    Victorino Sparrow, LRT/CTRS         Victorino Sparrow A 07/18/2017 12:33 PM

## 2017-07-18 NOTE — Progress Notes (Signed)
Pt attended this evening's AA group.

## 2017-07-18 NOTE — Tx Team (Signed)
Interdisciplinary Treatment and Diagnostic Plan Update  07/18/2017 Time of Session: [place] MRN: [id number]  Principal Diagnosis: Substance induced mood disorder ([place])  Secondary Diagnoses: Principal Problem:   Substance induced mood disorder ([place])   Current Medications:  Current Facility-Administered Medications  Medication Dose Route Frequency Provider Last Rate Last Dose  . acetaminophen (TYLENOL) tablet 650 mg  650 mg Oral Q6H PRN Ethelene Hal, NP   650 mg at 07/17/17 0802  . alum & mag hydroxide-simeth (MAALOX/MYLANTA) 200-200-20 MG/5ML suspension 30 mL  30 mL Oral Q4H PRN Ethelene Hal, NP      . haloperidol (HALDOL) tablet 5 mg  5 mg Oral Q6H PRN Ethelene Hal, NP   5 mg at 07/17/17 0801   And  . benztropine (COGENTIN) tablet 1 mg  1 mg Oral Q6H PRN Ethelene Hal, NP   1 mg at 07/17/17 0801  . citalopram (CELEXA) tablet 20 mg  20 mg Oral Daily Ethelene Hal, NP   20 mg at 07/18/17 0748  . feeding supplement (ENSURE ENLIVE) (ENSURE ENLIVE) liquid 237 mL  237 mL Oral BID BM Sharma Covert, MD   237 mL at 07/17/17 1550  . gabapentin (NEURONTIN) capsule 300 mg  300 mg Oral TID Ethelene Hal, NP   300 mg at 07/18/17 0749  . hydrOXYzine (ATARAX/VISTARIL) tablet 25 mg  25 mg Oral Q6H PRN Money, Lowry Ram, FNP      . loperamide (IMODIUM) capsule 2-4 mg  2-4 mg Oral PRN Money, Lowry Ram, FNP      . LORazepam (ATIVAN) tablet 1 mg  1 mg Oral Q6H PRN Money, Lowry Ram, FNP      . LORazepam (ATIVAN) tablet 1 mg  1 mg Oral TID Money, Lowry Ram, FNP   1 mg at 07/18/17 0751   Followed by  . [START ON 07/19/2017] LORazepam (ATIVAN) tablet 1 mg  1 mg Oral BID Money, Lowry Ram, FNP       Followed by  . [START ON 07/20/2017] LORazepam (ATIVAN) tablet 1 mg  1 mg Oral Daily Money, Travis B, FNP      . magnesium hydroxide (MILK OF MAGNESIA) suspension 30 mL  30 mL Oral Daily PRN Ethelene Hal, NP      . multivitamin with minerals tablet  1 tablet  1 tablet Oral Daily Money, Lowry Ram, FNP   1 tablet at 07/18/17 0749  . naltrexone (DEPADE) tablet 50 mg  50 mg Oral Daily Money, Lowry Ram, FNP   50 mg at 07/18/17 0749  . nicotine (NICODERM CQ - dosed in mg/24 hours) patch 21 mg  21 mg Transdermal Daily Sharma Covert, MD   21 mg at 07/18/17 0749  . ondansetron (ZOFRAN-ODT) disintegrating tablet 4 mg  4 mg Oral Q6H PRN Money, Lowry Ram, FNP      . thiamine (VITAMIN B-1) tablet 100 mg  100 mg Oral Daily Money, Travis B, FNP   100 mg at 07/18/17 0750  . traZODone (DESYREL) tablet 100 mg  100 mg Oral QHS Ethelene Hal, NP   100 mg at 07/17/17 2119   PTA Medications: Medications Prior to Admission  Medication Sig Dispense Refill Last Dose  . [DISCONTINUED] citalopram (CELEXA) 20 MG tablet Take 20 mg by mouth daily.   Past Week at Unknown time  . [DISCONTINUED] gabapentin (NEURONTIN) 300 MG capsule Take 1 capsule (300 mg total) by mouth 3 (three) times daily. 90 capsule 0 Past Week at  Unknown time  . [DISCONTINUED] traZODone (DESYREL) 100 MG tablet Take 100 mg by mouth at bedtime.   Past Week at Unknown time    Patient Stressors: Medication change or noncompliance Substance abuse  Patient Strengths: Ability for insight Average or above average intelligence Capable of independent living General fund of knowledge  Treatment Modalities: Medication Management, Group therapy, Case management,  1 to 1 session with clinician, Psychoeducation, Recreational therapy.   Physician Treatment Plan for Primary Diagnosis: Substance induced mood disorder ([place]) Long Term Goal(s): Improvement in symptoms so as ready for discharge Improvement in symptoms so as ready for discharge   Short Term Goals: Ability to identify changes in lifestyle to reduce recurrence of condition will improve Ability to verbalize feelings will improve Ability to disclose and discuss suicidal ideas Ability to identify triggers associated with substance  abuse/mental health issues will improve Ability to identify changes in lifestyle to reduce recurrence of condition will improve Ability to disclose and discuss suicidal ideas Ability to demonstrate self-control will improve Compliance with prescribed medications will improve Ability to identify triggers associated with substance abuse/mental health issues will improve  Medication Management: Evaluate patient's response, side effects, and tolerance of medication regimen.  Therapeutic Interventions: 1 to 1 sessions, Unit Group sessions and Medication administration.  Evaluation of Outcomes: Adequate for Discharge  Physician Treatment Plan for Secondary Diagnosis: Principal Problem:   Substance induced mood disorder ([place])  Long Term Goal(s): Improvement in symptoms so as ready for discharge Improvement in symptoms so as ready for discharge   Short Term Goals: Ability to identify changes in lifestyle to reduce recurrence of condition will improve Ability to verbalize feelings will improve Ability to disclose and discuss suicidal ideas Ability to identify triggers associated with substance abuse/mental health issues will improve Ability to identify changes in lifestyle to reduce recurrence of condition will improve Ability to disclose and discuss suicidal ideas Ability to demonstrate self-control will improve Compliance with prescribed medications will improve Ability to identify triggers associated with substance abuse/mental health issues will improve     Medication Management: Evaluate patient's response, side effects, and tolerance of medication regimen.  Therapeutic Interventions: 1 to 1 sessions, Unit Group sessions and Medication administration.  Evaluation of Outcomes: Adequate for Discharge   RN Treatment Plan for Primary Diagnosis: Substance induced mood disorder ([place]) Long Term Goal(s): Knowledge of disease and therapeutic regimen to maintain health will improve  Short  Term Goals: Ability to remain free from injury will improve, Ability to verbalize feelings will improve and Ability to disclose and discuss suicidal ideas  Medication Management: RN will administer medications as ordered by provider, will assess and evaluate patient's response and provide education to patient for prescribed medication. RN will report any adverse and/or side effects to prescribing provider.  Therapeutic Interventions: 1 on 1 counseling sessions, Psychoeducation, Medication administration, Evaluate responses to treatment, Monitor vital signs and CBGs as ordered, Perform/monitor CIWA, COWS, AIMS and Fall Risk screenings as ordered, Perform wound care treatments as ordered.  Evaluation of Outcomes: Adequate for Discharge   LCSW Treatment Plan for Primary Diagnosis: Substance induced mood disorder ([place]) Long Term Goal(s): Safe transition to appropriate next level of care at discharge, Engage patient in therapeutic group addressing interpersonal concerns.  Short Term Goals: Engage patient in aftercare planning with referrals and resources, Facilitate patient progression through stages of change regarding substance use diagnoses and concerns and Identify triggers associated with mental health/substance abuse issues  Therapeutic Interventions: Assess for all discharge needs, 1 to 1  time with Education officer, museum, Explore available resources and support systems, Assess for adequacy in community support network, Educate family and significant other(s) on suicide prevention, Complete Psychosocial Assessment, Interpersonal group therapy.  Evaluation of Outcomes: Adequate for Discharge   Progress in Treatment: Attending groups: Yes. Participating in groups: Yes. Taking medication as prescribed: Yes. Toleration medication: Yes. Family/Significant other contact made: SPE completed with pt; pt declined to consent to collateral contact.  Patient understands diagnosis: Yes. Discussing patient  identified problems/goals with staff: Yes. Medical problems stabilized or resolved: Yes. Denies suicidal/homicidal ideation: Yes. Issues/concerns per patient self-inventory: No. Other: n/a   New problem(s) identified: No, Describe:  n/a  New Short Term/Long Term Goal(s): detox, medication management for mood stabilization; elimination of SI thoughts; development of comprehensive mental wellness/sobriety plan.   Patient Goal: "To stay sober and get out of the hospital."   Discharge Plan or Barriers: CSW is declining residential treatment and plans to return to his tent. Pt has follow-up in place at [facility]. Pt has been provided with IRC/PATH information, [place] pamphlet, AA/NA list for [employer], and [employer] information for additional community support.   Reason for Continuation of Hospitalization: none  Estimated Length of Stay: today, 07/18/17  Attendees: Patient: Barry Moore  07/18/2017 9:53 AM  Physician: Dr. Mallie Darting MD; Dr. Nancy Fetter MD 07/18/2017 9:53 AM  Nursing: Opal Sidles RN; [place] RN 07/18/2017 9:53 AM  RN Care Manager:x 07/18/2017 9:53 AM  Social Worker: Maxie Better, LCSW 07/18/2017 9:53 AM  Recreational Therapist: x 07/18/2017 9:53 AM  Other: Lindell Spar NP; Darnelle Maffucci Money NP 07/18/2017 9:53 AM  Other:  07/18/2017 9:53 AM  Other: 07/18/2017 9:53 AM    Scribe for Treatment Team: [place], LCSW 07/18/2017 9:53 AM

## 2017-07-18 NOTE — Progress Notes (Signed)
Patient's self inventory sheet, patient sleeps good, no sleep medication.  Good appetite, normal energy level, good concentration.  Rated depression, hopeless 5, anxiety 3.  Withdrawals, runny nose.  Denied SI.  Physical problems, none.  Denied physical pain.  Goal is discharge.  Plans to talk today.  Does have discharge plans.

## 2017-07-30 ENCOUNTER — Emergency Department (HOSPITAL_COMMUNITY)
Admission: EM | Admit: 2017-07-30 | Discharge: 2017-07-30 | Disposition: A | Discharge status: Home / Self Care | Payer: Self-pay | Attending: Emergency Medicine | Admitting: Emergency Medicine

## 2017-07-30 ENCOUNTER — Encounter (HOSPITAL_COMMUNITY): Payer: Self-pay

## 2017-07-30 ENCOUNTER — Other Ambulatory Visit: Payer: Self-pay

## 2017-07-30 ENCOUNTER — Emergency Department (HOSPITAL_COMMUNITY): Payer: Self-pay

## 2017-07-30 ENCOUNTER — Other Ambulatory Visit (HOSPITAL_COMMUNITY): Payer: Self-pay

## 2017-07-30 DIAGNOSIS — Z79899 Other long term (current) drug therapy: Secondary | ICD-10-CM | POA: Insufficient documentation

## 2017-07-30 DIAGNOSIS — F10929 Alcohol use, unspecified with intoxication, unspecified: Secondary | ICD-10-CM | POA: Insufficient documentation

## 2017-07-30 DIAGNOSIS — G8929 Other chronic pain: Secondary | ICD-10-CM | POA: Insufficient documentation

## 2017-07-30 DIAGNOSIS — F17210 Nicotine dependence, cigarettes, uncomplicated: Secondary | ICD-10-CM | POA: Insufficient documentation

## 2017-07-30 DIAGNOSIS — F10920 Alcohol use, unspecified with intoxication, uncomplicated: Secondary | ICD-10-CM

## 2017-07-30 DIAGNOSIS — R1110 Vomiting, unspecified: Secondary | ICD-10-CM | POA: Insufficient documentation

## 2017-07-30 DIAGNOSIS — K852 Alcohol induced acute pancreatitis without necrosis or infection: Secondary | ICD-10-CM

## 2017-07-30 DIAGNOSIS — R109 Unspecified abdominal pain: Secondary | ICD-10-CM | POA: Insufficient documentation

## 2017-07-30 DIAGNOSIS — I10 Essential (primary) hypertension: Secondary | ICD-10-CM | POA: Insufficient documentation

## 2017-07-30 LAB — COMPREHENSIVE METABOLIC PANEL
ALBUMIN: 4.1 g/dL (ref 3.5–5.0)
ALT: 76 U/L — AB (ref 17–63)
AST: 124 U/L — AB (ref 15–41)
Alkaline Phosphatase: 119 U/L (ref 38–126)
Anion gap: 12 (ref 5–15)
BUN: 8 mg/dL (ref 6–20)
CO2: 23 mmol/L (ref 22–32)
CREATININE: 0.81 mg/dL (ref 0.61–1.24)
Calcium: 8.5 mg/dL — ABNORMAL LOW (ref 8.9–10.3)
Chloride: 107 mmol/L (ref 101–111)
GFR calc Af Amer: >60 mL/min (ref >60)
GLUCOSE: 123 mg/dL — AB (ref 65–99)
Potassium: 3.5 mmol/L (ref 3.5–5.1)
Sodium: 142 mmol/L (ref 135–145)
Total Bilirubin: 0.5 mg/dL (ref 0.3–1.2)
Total Protein: 7.9 g/dL (ref 6.5–8.1)

## 2017-07-30 LAB — URINALYSIS, ROUTINE W REFLEX MICROSCOPIC
BILIRUBIN URINE: NEGATIVE
GLUCOSE, UA: NEGATIVE mg/dL
HGB URINE DIPSTICK: NEGATIVE
Ketones, ur: NEGATIVE mg/dL
Leukocytes, UA: NEGATIVE
Nitrite: NEGATIVE
PROTEIN: NEGATIVE mg/dL
Specific Gravity, Urine: 1.01 (ref 1.005–1.030)
pH: 5 (ref 5.0–8.0)

## 2017-07-30 LAB — CBC
HEMATOCRIT: 31.5 % — AB (ref 39.0–52.0)
Hemoglobin: 9.7 g/dL — ABNORMAL LOW (ref 13.0–17.0)
MCH: 22.8 pg — AB (ref 26.0–34.0)
MCHC: 30.8 g/dL (ref 30.0–36.0)
MCV: 73.9 fL — ABNORMAL LOW (ref 78.0–100.0)
PLATELETS: 159 10*3/uL (ref 150–400)
RBC: 4.26 MIL/uL (ref 4.22–5.81)
RDW: 19.1 % — AB (ref 11.5–15.5)
WBC: 4 10*3/uL (ref 4.0–10.5)

## 2017-07-30 LAB — ETHANOL: Alcohol, Ethyl (B): 431 mg/dL (ref <10)

## 2017-07-30 LAB — LIPASE, BLOOD: LIPASE: 62 U/L — AB (ref 11–51)

## 2017-07-30 MED ORDER — SODIUM CHLORIDE 0.9 % IV BOLUS
1000.0000 mL | Freq: Once | INTRAVENOUS | Status: AC
  Administered 2017-07-30: 1000 mL via INTRAVENOUS

## 2017-07-30 MED ORDER — OXYCODONE-ACETAMINOPHEN 5-325 MG PO TABS
1.0000 | ORAL_TABLET | Freq: Once | ORAL | Status: AC
  Administered 2017-07-30: 1 via ORAL
  Filled 2017-07-30: qty 1

## 2017-07-30 MED ORDER — PROCHLORPERAZINE EDISYLATE 10 MG/2ML IJ SOLN
10.0000 mg | Freq: Once | INTRAMUSCULAR | Status: AC
  Administered 2017-07-30: 10 mg via INTRAVENOUS
  Filled 2017-07-30: qty 2

## 2017-07-30 MED ORDER — SODIUM CHLORIDE 0.9 % IV BOLUS: 1000.0000 mL | Freq: Once | INTRAVENOUS | Status: DC

## 2017-07-30 MED ORDER — GI COCKTAIL ~~LOC~~
30.0000 mL | Freq: Once | ORAL | Status: AC
  Administered 2017-07-30: 30 mL via ORAL
  Filled 2017-07-30: qty 30

## 2017-07-30 NOTE — ED Triage Notes (Addendum)
Pt BIBA - called by friend. Friend says pt keeps having seizures. Pt unable to obtain meds d/t homelessness.  Pt wants to get sober. Pt has had 20-30 beers today. Pt c/o back pain and emesis (at least 3x).

## 2017-07-30 NOTE — ED Notes (Signed)
Pt was ambulated, walked fine.

## 2017-08-01 NOTE — ED Provider Notes (Signed)
[place] DEPT Provider Note   CSN: [id number] Arrival date & time: 07/30/17  1346     History   Chief Complaint Chief Complaint  Patient presents with  . Seizures  . Emesis    HPI Barry Moore is a 52 y.o. male.  HPI 52 year old male past medical history significant for EtOH abuse, chronic pancreatitis, withdrawal seizures presents to the ED for evaluation of chronic abdominal pain along with alcohol intoxication and seizure-like activity.  Patient is well-known to the ED.  He has been seen 25 times in the past 6 months.  Patient states that he is been having abdominal pain that is in his epigastric region for the past 2 to 3 days.  He reports some nausea but denies any vomiting.  He denies any associated diarrhea, hematemesis, melena or hematochezia.  Patient reports chronic alcohol ingestion.  States he drinks 20-30 beers today.  Patient states he has a history of seizures but does not take any medications because he cannot afford them and he is homeless.  States that a friend says that he kept having seizures today.  Patient states that his epigastric pain is typical of his pancreatitis.  Denies any associated fevers.  Has not taken anything for pain.  Denies any associated headache, vision changes, paresthesias, weakness, neck pain, back pain, chest pain, shortness of breath, urinary symptoms.  Patient denies any SI, HI, auditory or visual hallucinations. Past Medical History:  Diagnosis Date  . Alcoholic hepatitis   . Depression   . ETOH abuse   . Hypertension   . Pancreatitis   . Seizures [facility])     Patient Active Problem List   Diagnosis Date Noted  . Substance induced mood disorder ([place]) 07/16/2017  . Severe recurrent major depression without psychotic features ([place]) 05/15/2017  . Anemia 03/18/2017  . Acute blood loss anemia 02/21/2017  . UGI bleed 02/08/2017  . Alcohol withdrawal ([place]) 02/08/2017  . Cocaine abuse ([place]) 11/18/2016  .  GI bleed 11/01/2016  . Alcohol abuse   . Major depressive disorder, recurrent severe without psychotic features ([place]) 07/06/2016  . Alcohol-induced mood disorder ([place]) 06/15/2016  . Seizure ([place]) 05/26/2016  . Tobacco abuse 05/26/2016  . Homeless 05/26/2016  . Essential hypertension 05/26/2016  . Alcohol dependence with alcohol-induced mood disorder ([place]) 02/14/2016  . Severe alcohol withdrawal without perceptual disturbances without complication ([place]) [id number]/5573  . Alcoholic hepatitis without ascites 02/14/2016  . Thrombocytopenia ([place]) 02/14/2016    Past Surgical History:  Procedure Laterality Date  . ESOPHAGOGASTRODUODENOSCOPY (EGD) WITH PROPOFOL N/A 11/02/2016   Procedure: ESOPHAGOGASTRODUODENOSCOPY (EGD) WITH PROPOFOL;  Surgeon: Carol Ada, MD;  Location: [facility] ENDOSCOPY;  Service: Endoscopy;  Laterality: N/A;  . HERNIA REPAIR    . LEG SURGERY          Home Medications    Prior to Admission medications   Medication Sig Start Date End Date Taking? Authorizing Provider  citalopram (CELEXA) 20 MG tablet Take 1 tablet (20 mg total) by mouth daily. For mood control 07/18/17   Money, Lowry Ram, FNP  gabapentin (NEURONTIN) 300 MG capsule Take 1 capsule (300 mg total) by mouth 3 (three) times daily. For withdrawal symptoms 07/18/17   Money, Lowry Ram, FNP  naltrexone (DEPADE) 50 MG tablet Take 1 tablet (50 mg total) by mouth daily. For cravings 07/19/17   Money, Lowry Ram, FNP  traZODone (DESYREL) 100 MG tablet Take 1 tablet (100 mg total) by mouth at bedtime. For insomnia 07/18/17   Money, Darnelle Maffucci  B, FNP    Family History Family History  Problem Relation Age of Onset  . Diabetes Mother   . Alcoholism Mother   . Emphysema Father   . Lung cancer Father   . Alcoholism Father     Social History Social History   Tobacco Use  . Smoking status: Current Every Day Smoker    Packs/day: 1.00    Types: Cigarettes  . Smokeless tobacco: Never Used  Substance Use Topics  . Alcohol use:  Yes    Comment: 4-5 40oz beers daily  . Drug use: Yes    Frequency: 3.0 times per week    Types: Cocaine    Comment: last use today 2 weeks ago (04-09-17)      Allergies   Tomato and Aspirin   Review of Systems Review of Systems  All other systems reviewed and are negative.    Physical Exam Updated Vital Signs BP 132/89 (BP Location: Left Arm)   Pulse 98   Temp 98.1 F (36.7 C) (Oral)   Resp 18   Ht 5\' 7"  (1.702 m)   Wt 78.5 kg (173 lb)   SpO2 96%   BMI 27.10 kg/m   Physical Exam  Constitutional: He is oriented to person, place, and time. He appears well-developed and well-nourished. No distress.  Patient is very disheveled.  Smells of alcohol.  HENT:  Head: Normocephalic and atraumatic.  Eyes: Right eye exhibits no discharge. Left eye exhibits no discharge. No scleral icterus.  Neck: Normal range of motion. Neck supple.  No c spine midline tenderness. No paraspinal tenderness. No deformities or step offs noted. Full ROM. Supple. No nuchal rigidity.    Cardiovascular: Normal rate, regular rhythm, normal heart sounds and intact distal pulses. Exam reveals no friction rub.  No murmur heard. Pulmonary/Chest: Effort normal and breath sounds normal. No stridor. No respiratory distress. He has no wheezes. He has no rales. He exhibits no tenderness.  Abdominal: Soft. Normal appearance and bowel sounds are normal. He exhibits no ascites. There is no hepatomegaly. There is tenderness in the epigastric area. There is no rigidity, no rebound, no guarding, no CVA tenderness, no tenderness at McBurney's point and negative Murphy's sign.  Musculoskeletal: Normal range of motion.  Neurological: He is alert and oriented to person, place, and time.  Speech is slurred.  Follows commands appropriately.  Moves all extremities without any difficulties.  Skin: Skin is warm and dry. Capillary refill takes less than 2 seconds. No pallor.  Psychiatric: His behavior is normal. Judgment and  thought content normal.  Nursing note and vitals reviewed.    ED Treatments / Results  Labs (all labs ordered are listed, but only abnormal results are displayed) Labs Reviewed  LIPASE, BLOOD - Abnormal; Notable for the following components:      Result Value   Lipase 62 (*)    All other components within normal limits  COMPREHENSIVE METABOLIC PANEL - Abnormal; Notable for the following components:   Glucose, Bld 123 (*)    Calcium 8.5 (*)    AST 124 (*)    ALT 76 (*)    All other components within normal limits  CBC - Abnormal; Notable for the following components:   Hemoglobin 9.7 (*)    HCT 31.5 (*)    MCV 73.9 (*)    MCH 22.8 (*)    RDW 19.1 (*)    All other components within normal limits  ETHANOL - Abnormal; Notable for the following components:   Alcohol,  Ethyl (B) 431 (*)    All other components within normal limits  URINALYSIS, ROUTINE W REFLEX MICROSCOPIC    EKG None  Radiology US Abdomen Complete  Result Date: 07/30/2017 CLINICAL DATA:  Pancreatitis. Back pain. Nausea, vomiting. Symptoms for 3 weeks. History of hypertension alcoholic hepatitis. EXAM: ABDOMEN ULTRASOUND COMPLETE COMPARISON:  CT of the abdomen and pelvis on 05/06/2017 FINDINGS: Gallbladder: Gallbladder wall is normal in thickness, 2.4 millimeters. Gallbladder sludge is present. There is no pericholecystic fluid. No stones or sonographic Murphy's sign. Common bile duct: Diameter: 3.3 millimeters Liver: Echogenic liver parenchyma without other features of hepatic steatosis. No focal liver lesions. Portal vein is patent on color Doppler imaging with normal direction of blood flow towards the liver. IVC: No abnormality visualized. Pancreas: Pancreas is mildly heterogeneous. No discrete fluid collections are identified sonographically. Spleen: Size and appearance within normal limits. Right Kidney: Length: 12.4 centimeter. Echogenicity within normal limits. No mass or hydronephrosis visualized. Left Kidney:  Length: 13.3 centimeter. Echogenicity within normal limits. No mass or hydronephrosis visualized. Abdominal aorta: Abdominal aorta measures 2.4 centimeters. Atherosclerotic calcification is noted along the distal aspect of the abdominal aorta. Other findings: None. IMPRESSION: 1. No evidence for acute cholecystitis. 2. Mildly heterogeneous pancreas without discrete fluid collections identified. 3. Echogenic liver without other stigmata of hepatic steatosis. 4.  Aortic atherosclerosis.  (ICD10-I70.0) Electronically Signed   By: Nolon Nations M.D.   On: 07/30/2017 17:59   Dg Abdomen Acute W/chest  Result Date: 07/30/2017 CLINICAL DATA:  Epigastric pain.  Known pancreatitis. EXAM: DG ABDOMEN ACUTE W/ 1V CHEST COMPARISON:  May 25, 2017 FINDINGS: There is no evidence of dilated bowel loops or free intraperitoneal air. No radiopaque calculi or other significant radiographic abnormality is seen. Chronic change of the left clavicle is stable. Heart size and mediastinal contours are within normal limits. Both lungs are clear. IMPRESSION: No bowel obstruction or free air.  No acute cardiopulmonary disease. Electronically Signed   By: Abelardo Diesel M.D.   On: 07/30/2017 16:08    Procedures Procedures (including critical care time)  Medications Ordered in ED Medications  sodium chloride 0.9 % bolus 1,000 mL (0 mLs Intravenous Stopped 07/30/17 1811)  gi cocktail (Maalox,Lidocaine,Donnatal) (30 mLs Oral Given 07/30/17 1600)  prochlorperazine (COMPAZINE) injection 10 mg (10 mg Intravenous Given 07/30/17 1600)  oxyCODONE-acetaminophen (PERCOCET/ROXICET) 5-325 MG per tablet 1 tablet (1 tablet Oral Given 07/30/17 1600)     Initial Impression / Assessment and Plan / ED Course  I have reviewed the triage vital signs and the nursing notes.  Pertinent labs & imaging results that were available during my care of the patient were reviewed by me and considered in my medical decision making (see chart for details).       Patient presents to the ED for alcohol intoxication, chronic epigastric abdominal pain and seizure-like activity.  Patient has a history of withdrawal seizures.  States he drank 20-30 beers today.  Denies any associated fevers change in bowel habits or vomiting.  Vital signs are reassuring.  Patient is afebrile.  Does not meet Sirs or sepsis criteria.  Lab work shows no leukocytosis.  Hemoglobin is at baseline.  No significant electrolyte derangement.  Elevated liver enzymes with history of same likely secondary to alcohol use.  Patient's lipase is 62 which is at his baseline.  Ethanol is 431.  A UA shows no signs of infection.  On exam patient has no signs of peritonitis.  Does have some pain with palpation of the epigastric  region.  Bowel sounds present in all 4 quadrants.  Heart regular rate and rhythm.  Lungs clear to auscultation bilaterally.  Neurovascularly intact in all extremities.  Abdominal ultrasound shows no signs of cholecystitis or fluid collection over the pancreas.  Abdominal x-ray shows no signs of free air.  Patient given GI cocktail, Compazine, pain medication and fluids.  Pain significantly improved.  Patient was reassessed several times while in the ED.  He has had no signs of seizure-like activity.  No signs of withdrawal including tremors.    Pain seems consistent with chronic pancreatitis.  Do not feel that patient needs further imaging at this time.  Doubt cholecystitis, bowel obstruction, appendicitis, diverticulitis.  Patient woke up several hours later while in the ED and states that he is ready to go.  He has tolerated p.o. fluid and food.  Ambulated to bathroom with normal gait and without assistance.  Patient appears clinically sober.    Pt is hemodynamically stable, in NAD, & able to ambulate in the ED. Evaluation does not show pathology that would require ongoing emergent intervention or inpatient treatment. I explained the diagnosis to the patient. Pain has been  managed & has no complaints prior to dc. Pt is comfortable with above plan and is stable for discharge at this time. All questions were answered prior to disposition. Strict return precautions for f/u to the ED were discussed. Encouraged follow up with PCP.   Final Clinical Impressions(s) / ED Diagnoses   Final diagnoses:  Alcoholic intoxication without complication [facility])  Chronic abdominal pain    ED Discharge Orders    None       Aaron Edelman 08/01/17 0201    Lacretia Leigh, MD 08/01/17 1319

## 2017-08-05 ENCOUNTER — Emergency Department (HOSPITAL_COMMUNITY): Admission: EM | Admit: 2017-08-05 | Discharge: 2017-08-05 | Discharge status: Absent without leave | Payer: MEDICAID

## 2017-08-05 NOTE — ED Notes (Signed)
Pt becoming very irrate in lobby, pt got up out his wheelchair sts he is leaving, pt currently standing outside yelling that he has seizure and staff is just going to let him die. Pt is stable on his feet and speaking in clear sentences.

## 2017-08-05 NOTE — ED Triage Notes (Signed)
Pt BIB [facility] d/t ETOH and seizure-like activity. When asked how much, he states, "not enough." No seizure-like activity noted in triage. A&Ox4.

## 2017-08-06 ENCOUNTER — Emergency Department (HOSPITAL_COMMUNITY)
Admission: EM | Admit: 2017-08-06 | Discharge: 2017-08-07 | Disposition: A | Discharge status: Home / Self Care | Payer: Self-pay | Attending: Emergency Medicine | Admitting: Emergency Medicine

## 2017-08-06 ENCOUNTER — Encounter (HOSPITAL_COMMUNITY): Payer: Self-pay | Admitting: Emergency Medicine

## 2017-08-06 DIAGNOSIS — R45851 Suicidal ideations: Secondary | ICD-10-CM | POA: Insufficient documentation

## 2017-08-06 DIAGNOSIS — F1024 Alcohol dependence with alcohol-induced mood disorder: Secondary | ICD-10-CM | POA: Diagnosis present

## 2017-08-06 DIAGNOSIS — F1020 Alcohol dependence, uncomplicated: Secondary | ICD-10-CM | POA: Insufficient documentation

## 2017-08-06 DIAGNOSIS — F17210 Nicotine dependence, cigarettes, uncomplicated: Secondary | ICD-10-CM | POA: Insufficient documentation

## 2017-08-06 DIAGNOSIS — Z79899 Other long term (current) drug therapy: Secondary | ICD-10-CM | POA: Insufficient documentation

## 2017-08-06 DIAGNOSIS — F332 Major depressive disorder, recurrent severe without psychotic features: Secondary | ICD-10-CM | POA: Insufficient documentation

## 2017-08-06 DIAGNOSIS — F1010 Alcohol abuse, uncomplicated: Secondary | ICD-10-CM

## 2017-08-06 DIAGNOSIS — I10 Essential (primary) hypertension: Secondary | ICD-10-CM | POA: Insufficient documentation

## 2017-08-06 LAB — CBC WITH DIFFERENTIAL/PLATELET
ABS IMMATURE GRANULOCYTES: 0 10*3/uL (ref 0.0–0.1)
Basophils Absolute: 0.1 10*3/uL (ref 0.0–0.1)
Basophils Relative: 2 %
Eosinophils Absolute: 0.1 10*3/uL (ref 0.0–0.7)
Eosinophils Relative: 2 %
HEMATOCRIT: 32.9 % — AB (ref 39.0–52.0)
HEMOGLOBIN: 10 g/dL — AB (ref 13.0–17.0)
Immature Granulocytes: 0 %
LYMPHS ABS: 1.2 10*3/uL (ref 0.7–4.0)
LYMPHS PCT: 35 %
MCH: 22.3 pg — AB (ref 26.0–34.0)
MCHC: 30.4 g/dL (ref 30.0–36.0)
MCV: 73.3 fL — AB (ref 78.0–100.0)
MONO ABS: 0.7 10*3/uL (ref 0.1–1.0)
MONOS PCT: 20 %
NEUTROS PCT: 41 %
Neutro Abs: 1.5 10*3/uL — ABNORMAL LOW (ref 1.7–7.7)
Platelets: 78 10*3/uL — ABNORMAL LOW (ref 150–400)
RBC: 4.49 MIL/uL (ref 4.22–5.81)
RDW: 18.9 % — ABNORMAL HIGH (ref 11.5–15.5)
WBC: 3.5 10*3/uL — AB (ref 4.0–10.5)

## 2017-08-06 LAB — COMPREHENSIVE METABOLIC PANEL
ALT: 51 U/L (ref 17–63)
AST: 105 U/L — ABNORMAL HIGH (ref 15–41)
Albumin: 4.2 g/dL (ref 3.5–5.0)
Alkaline Phosphatase: 110 U/L (ref 38–126)
Anion gap: 15 (ref 5–15)
BILIRUBIN TOTAL: 0.8 mg/dL (ref 0.3–1.2)
BUN: 6 mg/dL (ref 6–20)
CHLORIDE: 101 mmol/L (ref 101–111)
CO2: 21 mmol/L — ABNORMAL LOW (ref 22–32)
Calcium: 9.1 mg/dL (ref 8.9–10.3)
Creatinine, Ser: 0.9 mg/dL (ref 0.61–1.24)
GFR calc non Af Amer: >60 mL/min (ref >60)
Glucose, Bld: 98 mg/dL (ref 65–99)
POTASSIUM: 3.4 mmol/L — AB (ref 3.5–5.1)
Sodium: 137 mmol/L (ref 135–145)
TOTAL PROTEIN: 7.8 g/dL (ref 6.5–8.1)

## 2017-08-06 LAB — SALICYLATE LEVEL: Salicylate Lvl: <7 mg/dL (ref 2.8–30.0)

## 2017-08-06 LAB — RAPID URINE DRUG SCREEN, HOSP PERFORMED
AMPHETAMINES: NOT DETECTED
BENZODIAZEPINES: NOT DETECTED
Barbiturates: NOT DETECTED
Cocaine: POSITIVE — AB
OPIATES: NOT DETECTED
Tetrahydrocannabinol: NOT DETECTED

## 2017-08-06 LAB — ACETAMINOPHEN LEVEL: Acetaminophen (Tylenol), Serum: <10 ug/mL — ABNORMAL LOW (ref 10–30)

## 2017-08-06 LAB — ETHANOL: ALCOHOL ETHYL (B): 362 mg/dL — AB (ref <10)

## 2017-08-06 MED ORDER — ONDANSETRON HCL 4 MG PO TABS: 4.0000 mg | ORAL_TABLET | Freq: Three times a day (TID) | ORAL | Status: DC | PRN

## 2017-08-06 MED ORDER — ALUM & MAG HYDROXIDE-SIMETH 200-200-20 MG/5ML PO SUSP: 30.0000 mL | ORAL | Status: DC | PRN

## 2017-08-06 MED ORDER — GABAPENTIN 300 MG PO CAPS
300.0000 mg | ORAL_CAPSULE | Freq: Three times a day (TID) | ORAL | Status: DC
Start: 2017-08-06 — End: 2017-08-07
  Administered 2017-08-06 – 2017-08-07 (×2): 300 mg via ORAL
  Filled 2017-08-06 (×2): qty 1

## 2017-08-06 MED ORDER — VITAMIN B-1 100 MG PO TABS
100.0000 mg | ORAL_TABLET | Freq: Every day | ORAL | Status: DC
  Administered 2017-08-06 – 2017-08-07 (×2): 100 mg via ORAL
  Filled 2017-08-06 (×2): qty 1

## 2017-08-06 MED ORDER — THIAMINE HCL 100 MG/ML IJ SOLN: 100.0000 mg | Freq: Every day | INTRAMUSCULAR | Status: DC

## 2017-08-06 MED ORDER — ALUM & MAG HYDROXIDE-SIMETH 200-200-20 MG/5ML PO SUSP: 30.0000 mL | Freq: Four times a day (QID) | ORAL | Status: DC | PRN

## 2017-08-06 MED ORDER — LORAZEPAM 1 MG PO TABS
1.0000 mg | ORAL_TABLET | Freq: Four times a day (QID) | ORAL | Status: DC | PRN
Start: 2017-08-06 — End: 2017-08-07
  Administered 2017-08-06 – 2017-08-07 (×2): 1 mg via ORAL
  Filled 2017-08-06 (×2): qty 1

## 2017-08-06 MED ORDER — LORAZEPAM 2 MG/ML IJ SOLN: 1.0000 mg | Freq: Four times a day (QID) | INTRAMUSCULAR | Status: DC | PRN

## 2017-08-06 MED ORDER — NICOTINE 21 MG/24HR TD PT24
21.0000 mg | MEDICATED_PATCH | Freq: Every day | TRANSDERMAL | Status: DC
  Administered 2017-08-06: 21 mg via TRANSDERMAL
  Filled 2017-08-06: qty 1

## 2017-08-06 MED ORDER — ADULT MULTIVITAMIN W/MINERALS CH
1.0000 | ORAL_TABLET | Freq: Every day | ORAL | Status: DC
  Administered 2017-08-06 – 2017-08-07 (×2): 1 via ORAL
  Filled 2017-08-06 (×2): qty 1

## 2017-08-06 MED ORDER — ACETAMINOPHEN 325 MG PO TABS: 650.0000 mg | ORAL_TABLET | ORAL | Status: DC | PRN

## 2017-08-06 MED ORDER — CITALOPRAM HYDROBROMIDE 10 MG PO TABS
20.0000 mg | ORAL_TABLET | Freq: Every day | ORAL | Status: DC
  Administered 2017-08-06 – 2017-08-07 (×2): 20 mg via ORAL
  Filled 2017-08-06 (×2): qty 2

## 2017-08-06 MED ORDER — DIAZEPAM 5 MG PO TABS
10.0000 mg | ORAL_TABLET | Freq: Once | ORAL | Status: AC
  Administered 2017-08-06: 10 mg via ORAL
  Filled 2017-08-06: qty 2

## 2017-08-06 MED ORDER — FOLIC ACID 1 MG PO TABS
1.0000 mg | ORAL_TABLET | Freq: Every day | ORAL | Status: DC
  Administered 2017-08-06 – 2017-08-07 (×2): 1 mg via ORAL
  Filled 2017-08-06 (×2): qty 1

## 2017-08-06 NOTE — ED Notes (Addendum)
Pt wanded by security and cleared.  

## 2017-08-06 NOTE — ED Provider Notes (Signed)
[facility] EMERGENCY DEPARTMENT Provider Note   CSN: [id number] Arrival date & time: 08/06/17  2028     History   Chief Complaint No chief complaint on file.   HPI Barry Moore is a 52 y.o. male.  He presents by EMS with a complaint of possible seizure, feeling depressed and wanting to kill himself or kill someone else.  Patient has a history of same.  He is a daily drinker half gallon of vodka.  Is been seen multiple times for alcohol intoxication, alcohol withdrawal, depression, possible seizure.  He states he has a history of attempted suicide in the past.  Is complaining of ongoing depression and feeling worthlessness.  He also has abdominal pain but is unable to explain how long this been going on.  He was drinking today earlier.  He feels he has not had enough to drink today and that is why he had a seizure.  The history is provided by the patient and the EMS personnel.  Seizures   This is a recurrent problem. The current episode started less than 1 hour ago. The problem has been resolved. There was 1 seizure. Duration: unknonwn. Associated symptoms include nausea. Pertinent negatives include no confusion, no speech difficulty, no sore throat and no chest pain. not witnessed The episode was not witnessed. The seizures did not continue in the ED. There has been no fever. There were no medications administered prior to arrival.    Past Medical History:  Diagnosis Date  . Alcoholic hepatitis   . Depression   . ETOH abuse   . Hypertension   . Pancreatitis   . Seizures [facility])     Patient Active Problem List   Diagnosis Date Noted  . Substance induced mood disorder ([place]) 07/16/2017  . Severe recurrent major depression without psychotic features ([place]) 05/15/2017  . Anemia 03/18/2017  . Acute blood loss anemia 02/21/2017  . UGI bleed 02/08/2017  . Alcohol withdrawal ([place]) 02/08/2017  . Cocaine abuse ([place]) 11/18/2016  . GI bleed 11/01/2016  . Alcohol abuse     . Major depressive disorder, recurrent severe without psychotic features ([place]) 07/06/2016  . 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 Seizure ([place]) 05/26/2016  . Tobacco abuse 05/26/2016  . Homeless 05/26/2016  . Essential hypertension 05/26/2016  . Alcohol dependence with alcohol-induced mood disorder ([place]) 02/14/2016  . Severe alcohol withdrawal without perceptual disturbances without complication ([place]) 40/12/2723  . Alcoholic hepatitis without ascites 02/14/2016  . Thrombocytopenia ([place]) 02/14/2016    Past Surgical History:  Procedure Laterality Date  . ESOPHAGOGASTRODUODENOSCOPY (EGD) WITH PROPOFOL N/A 11/02/2016   Procedure: ESOPHAGOGASTRODUODENOSCOPY (EGD) WITH PROPOFOL;  Surgeon: Carol Ada, MD;  Location: [facility] END[facility]COPY;  Service: Endoscopy;  Laterality: N/A;  . HERNIA REPAIR    . LEG SURGERY          Home Medications    Prior to Admission medications   Medication Sig Start Date End Date Taking? Authorizing Provider  citalopram (CELEXA) 20 MG tablet Take 1 tablet (20 mg total) by mouth daily. For mood control 07/18/17   Money, Lowry Ram, FNP  gabapentin (NEURONTIN) 300 MG capsule Take 1 capsule (300 mg total) by mouth 3 (three) times daily. For withdrawal symptoms 07/18/17   Money, Lowry Ram, FNP  naltrexone (DEPADE) 50 MG tablet Take 1 tablet (50 mg total) by mouth daily. For cravings 07/19/17   Money, Lowry Ram, FNP  traZODone (DESYREL) 100 MG tablet Take 1 tablet (100 mg total) by mouth at bedtime. For  insomnia 07/18/17   Money, Lowry Ram, FNP    Family History Family History  Problem Relation Age of Onset  . Diabetes Mother   . Alcoholism Mother   . Emphysema Father   . Lung cancer Father   . Alcoholism Father     Social History Social History   Tobacco Use  . Smoking status: Current Every Day Smoker    Packs/day: 1.00    Types: Cigarettes  . Smokeless tobacco: Never Used  Substance Use Topics  . Alcohol use: Yes    Comment: 4-5 40oz beers daily  .  Drug use: Yes    Frequency: 3.0 times per week    Types: Cocaine    Comment: last use today 2 weeks ago (04-09-17)      Allergies   Tomato and Aspirin   Review of Systems Review of Systems  Constitutional: Negative for fever.  HENT: Negative for sore throat.   Respiratory: Negative for shortness of breath.   Cardiovascular: Negative for chest pain.  Gastrointestinal: Positive for abdominal pain and nausea.  Genitourinary: Negative for dysuria.  Skin: Negative for rash.  Neurological: Positive for seizures. Negative for speech difficulty.  Psychiatric/Behavioral: Positive for suicidal ideas. Negative for confusion.     Physical Exam Updated Vital Signs BP (!) 150/96 (BP Location: Right Arm)   Pulse 96   Temp 98 F (36.7 C) (Oral)   Resp 18   SpO2 97%   Physical Exam  Constitutional: He appears well-developed and well-nourished.  HENT:  Head: Normocephalic and atraumatic.  Eyes: Conjunctivae are normal.  Neck: Neck supple.  Cardiovascular: Normal rate and regular rhythm.  No murmur heard. Pulmonary/Chest: Effort normal and breath sounds normal. No respiratory distress.  Abdominal: Soft. There is no tenderness.  Musculoskeletal: He exhibits no edema or deformity.  Neurological: He is alert. He has normal strength. No cranial nerve deficit or sensory deficit.  Skin: Skin is warm and dry.  Psychiatric: He has a normal mood and affect. His speech is normal and behavior is normal. He expresses homicidal and suicidal ideation.  Nursing note and vitals reviewed.    ED Treatments / Results  Labs (all labs ordered are listed, but only abnormal results are displayed) Labs Reviewed  COMPREHENSIVE METABOLIC PANEL - Abnormal; Notable for the following components:      Result Value   Potassium 3.4 (*)    CO2 21 (*)    AST 105 (*)    All other components within normal limits  ETHANOL - Abnormal; Notable for the following components:   Alcohol, Ethyl (B) 362 (*)    All  other components within normal limits  RAPID URINE DRUG SCREEN, HOSP PERFORMED - Abnormal; Notable for the following components:   Cocaine POSITIVE (*)    All other components within normal limits  CBC WITH DIFFERENTIAL/PLATELET - Abnormal; Notable for the following components:   WBC 3.5 (*)    Hemoglobin 10.0 (*)    HCT 32.9 (*)    MCV 73.3 (*)    MCH 22.3 (*)    RDW 18.9 (*)    Platelets 78 (*)    Neutro Abs 1.5 (*)    All other components within normal limits  ACETAMINOPHEN LEVEL - Abnormal; Notable for the following components:   Acetaminophen (Tylenol), Serum <10 (*)    All other components within normal limits  SALICYLATE LEVEL    EKG EKG Interpretation  Date/Time:  Saturday Aug 06 2017 20:54:02 EDT Ventricular Rate:  98 PR Interval:  QRS Duration: 103 QT Interval:  348 QTC Calculation: 445 R Axis:   76 Text Interpretation:  Sinus rhythm no acute st/ts similar to prior 3/19 Confirmed by Aletta Edouard [phone]) on 08/06/2017 8:56:49 PM Also confirmed by Aletta Edouard [phone]), editor Philomena Doheny [phone])  on 08/07/2017 8:52:54 AM   Radiology No results found.  Procedures Procedures (including critical care time)  Medications Ordered in ED Medications  alum & mag hydroxide-simeth (MAALOX/MYLANTA) 200-200-20 MG/5ML suspension 30 mL (has no administration in time range)  LORazepam (ATIVAN) tablet 1 mg (has no administration in time range)    Or  LORazepam (ATIVAN) injection 1 mg (has no administration in time range)  thiamine (VITAMIN B-1) tablet 100 mg (has no administration in time range)    Or  thiamine (B-1) injection 100 mg (has no administration in time range)  folic acid (FOLVITE) tablet 1 mg (has no administration in time range)  multivitamin with minerals tablet 1 tablet (has no administration in time range)     Initial Impression / Assessment and Plan / ED Course  I have reviewed the triage vital signs and the nursing notes.  Pertinent labs &  imaging results that were available during my care of the patient were reviewed by me and considered in my medical decision making (see chart for details).  Clinical Course as of Aug 08 1044  Sat Aug 06, 2017  2113 Was informed by the nurse that the patient felt like he was having a seizure and he was awake and shaking all over.  There is no postictal period.  His vital signs do not indicate any acute withdrawal at present.  It is unlikely that he has withdrawal seizure despite the patient looking for some benzodiazepines.   [MB]  2135 Alcohol level came back at 362 so I doubt he had a withdrawal seizure.  I have ordered him 10 of Valium anyway as just keep him settle down.  He is positive for cocaine and his salicylates acetaminophen are negative.  Does have some mild thrombocytopenia but that was seen in prior visits also.  Feel patient is medically cleared and will book him for a TTS consult.   [MB]  2219 Got a call from behavioral health that they will defer on seeing the patient tomorrow morning when he is more sober.   [MB]    Clinical Course User Index [MB] Hayden Rasmussen, MD     Final Clinical Impressions(s) / ED Diagnoses   Final diagnoses:  Alcohol abuse  Suicidal thoughts    ED Discharge Orders    None       Hayden Rasmussen, MD 08/08/17 1046
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
## 2017-08-07 ENCOUNTER — Other Ambulatory Visit: Payer: Self-pay

## 2017-08-07 ENCOUNTER — Encounter (HOSPITAL_COMMUNITY): Payer: Self-pay | Admitting: Registered Nurse

## 2017-08-07 DIAGNOSIS — R45850 Homicidal ideations: Secondary | ICD-10-CM

## 2017-08-07 DIAGNOSIS — F1024 Alcohol dependence with alcohol-induced mood disorder: Secondary | ICD-10-CM

## 2017-08-07 DIAGNOSIS — R45851 Suicidal ideations: Secondary | ICD-10-CM

## 2017-08-07 DIAGNOSIS — Z811 Family history of alcohol abuse and dependence: Secondary | ICD-10-CM

## 2017-08-07 DIAGNOSIS — Z590 Homelessness: Secondary | ICD-10-CM

## 2017-08-07 DIAGNOSIS — Z560 Unemployment, unspecified: Secondary | ICD-10-CM

## 2017-08-07 DIAGNOSIS — F1410 Cocaine abuse, uncomplicated: Secondary | ICD-10-CM

## 2017-08-07 DIAGNOSIS — F17210 Nicotine dependence, cigarettes, uncomplicated: Secondary | ICD-10-CM

## 2017-08-07 NOTE — ED Notes (Signed)
Pt Lunch order placed. 

## 2017-08-07 NOTE — ED Provider Notes (Signed)
Per behavioral health patient is cleared for discharge.   Hayden Rasmussen, MD 08/07/17 [phone]

## 2017-08-07 NOTE — ED Notes (Signed)
Pt given snack and H20.

## 2017-08-07 NOTE — ED Notes (Signed)
ALL belongings - 1 labeled belongings bag and 1 valuables envelope - returned to pt - Pt signed verifying all items present. 
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
## 2017-08-13 ENCOUNTER — Encounter (HOSPITAL_COMMUNITY): Payer: Self-pay

## 2017-08-13 ENCOUNTER — Other Ambulatory Visit: Payer: Self-pay

## 2017-08-13 ENCOUNTER — Emergency Department (HOSPITAL_COMMUNITY)
Admission: EM | Admit: 2017-08-13 | Discharge: 2017-08-14 | Disposition: A | Discharge status: Home / Self Care | Payer: Self-pay | Attending: Emergency Medicine | Admitting: Emergency Medicine

## 2017-08-13 ENCOUNTER — Emergency Department (HOSPITAL_COMMUNITY): Payer: Self-pay

## 2017-08-13 DIAGNOSIS — F331 Major depressive disorder, recurrent, moderate: Secondary | ICD-10-CM | POA: Insufficient documentation

## 2017-08-13 DIAGNOSIS — F1024 Alcohol dependence with alcohol-induced mood disorder: Secondary | ICD-10-CM | POA: Diagnosis present

## 2017-08-13 DIAGNOSIS — R45851 Suicidal ideations: Secondary | ICD-10-CM | POA: Insufficient documentation

## 2017-08-13 DIAGNOSIS — F1010 Alcohol abuse, uncomplicated: Secondary | ICD-10-CM | POA: Insufficient documentation

## 2017-08-13 DIAGNOSIS — F1994 Other psychoactive substance use, unspecified with psychoactive substance-induced mood disorder: Secondary | ICD-10-CM

## 2017-08-13 DIAGNOSIS — Z008 Encounter for other general examination: Secondary | ICD-10-CM

## 2017-08-13 DIAGNOSIS — R4182 Altered mental status, unspecified: Secondary | ICD-10-CM | POA: Insufficient documentation

## 2017-08-13 DIAGNOSIS — Z79899 Other long term (current) drug therapy: Secondary | ICD-10-CM | POA: Insufficient documentation

## 2017-08-13 DIAGNOSIS — F17210 Nicotine dependence, cigarettes, uncomplicated: Secondary | ICD-10-CM | POA: Insufficient documentation

## 2017-08-13 DIAGNOSIS — I10 Essential (primary) hypertension: Secondary | ICD-10-CM | POA: Insufficient documentation

## 2017-08-13 HISTORY — DX: Other symptoms and signs involving emotional state: R45.89

## 2017-08-13 HISTORY — DX: Other symptoms and signs involving appearance and behavior: R46.89

## 2017-08-13 LAB — COMPREHENSIVE METABOLIC PANEL
ALK PHOS: 119 U/L (ref 38–126)
ALT: 58 U/L (ref 17–63)
ANION GAP: 13 (ref 5–15)
AST: 143 U/L — ABNORMAL HIGH (ref 15–41)
Albumin: 4.3 g/dL (ref 3.5–5.0)
BUN: 8 mg/dL (ref 6–20)
CALCIUM: 8.4 mg/dL — AB (ref 8.9–10.3)
CO2: 22 mmol/L (ref 22–32)
Chloride: 105 mmol/L (ref 101–111)
Creatinine, Ser: 0.83 mg/dL (ref 0.61–1.24)
Glucose, Bld: 100 mg/dL — ABNORMAL HIGH (ref 65–99)
Potassium: 3.3 mmol/L — ABNORMAL LOW (ref 3.5–5.1)
SODIUM: 140 mmol/L (ref 135–145)
Total Bilirubin: 0.9 mg/dL (ref 0.3–1.2)
Total Protein: 8.1 g/dL (ref 6.5–8.1)

## 2017-08-13 LAB — CBC
HEMATOCRIT: 33.6 % — AB (ref 39.0–52.0)
HEMOGLOBIN: 10.4 g/dL — AB (ref 13.0–17.0)
MCH: 23.1 pg — ABNORMAL LOW (ref 26.0–34.0)
MCHC: 31 g/dL (ref 30.0–36.0)
MCV: 74.7 fL — AB (ref 78.0–100.0)
Platelets: 92 10*3/uL — ABNORMAL LOW (ref 150–400)
RBC: 4.5 MIL/uL (ref 4.22–5.81)
RDW: 19.1 % — ABNORMAL HIGH (ref 11.5–15.5)
WBC: 3.8 10*3/uL — AB (ref 4.0–10.5)

## 2017-08-13 LAB — RAPID URINE DRUG SCREEN, HOSP PERFORMED
Amphetamines: NOT DETECTED
BARBITURATES: NOT DETECTED
BENZODIAZEPINES: POSITIVE — AB
COCAINE: POSITIVE — AB
Opiates: NOT DETECTED
TETRAHYDROCANNABINOL: NOT DETECTED

## 2017-08-13 LAB — SALICYLATE LEVEL: Salicylate Lvl: <7 mg/dL (ref 2.8–30.0)

## 2017-08-13 LAB — ETHANOL: Alcohol, Ethyl (B): 453 mg/dL (ref <10)

## 2017-08-13 LAB — ACETAMINOPHEN LEVEL: Acetaminophen (Tylenol), Serum: <10 ug/mL — ABNORMAL LOW (ref 10–30)

## 2017-08-13 MED ORDER — ONDANSETRON HCL 4 MG PO TABS
4.0000 mg | ORAL_TABLET | Freq: Three times a day (TID) | ORAL | Status: DC | PRN
Start: 2017-08-13 — End: 2017-08-14

## 2017-08-13 MED ORDER — TRAZODONE HCL 100 MG PO TABS
100.0000 mg | ORAL_TABLET | Freq: Every evening | ORAL | Status: DC | PRN
Start: 2017-08-13 — End: 2017-08-14
  Administered 2017-08-13: 100 mg via ORAL
  Filled 2017-08-13: qty 1

## 2017-08-13 MED ORDER — CITALOPRAM HYDROBROMIDE 10 MG PO TABS
20.0000 mg | ORAL_TABLET | Freq: Every day | ORAL | Status: DC
  Administered 2017-08-13 – 2017-08-14 (×2): 20 mg via ORAL
  Filled 2017-08-13 (×2): qty 2

## 2017-08-13 MED ORDER — ADULT MULTIVITAMIN W/MINERALS CH
1.0000 | ORAL_TABLET | Freq: Once | ORAL | Status: AC
  Administered 2017-08-13: 1 via ORAL
  Filled 2017-08-13: qty 1

## 2017-08-13 MED ORDER — THIAMINE HCL 100 MG/ML IJ SOLN: 100.0000 mg | Freq: Every day | INTRAMUSCULAR | Status: DC

## 2017-08-13 MED ORDER — LORAZEPAM 2 MG/ML IJ SOLN: 0.0000 mg | Freq: Four times a day (QID) | INTRAMUSCULAR | Status: DC

## 2017-08-13 MED ORDER — LORAZEPAM 1 MG PO TABS
0.0000 mg | ORAL_TABLET | Freq: Four times a day (QID) | ORAL | Status: DC
  Administered 2017-08-13 – 2017-08-14 (×3): 2 mg via ORAL
  Filled 2017-08-13 (×3): qty 2

## 2017-08-13 MED ORDER — VITAMIN B-1 100 MG PO TABS
100.0000 mg | ORAL_TABLET | Freq: Every day | ORAL | Status: DC
  Administered 2017-08-13 – 2017-08-14 (×2): 100 mg via ORAL
  Filled 2017-08-13 (×2): qty 1

## 2017-08-13 MED ORDER — POTASSIUM CHLORIDE CRYS ER 20 MEQ PO TBCR
20.0000 meq | EXTENDED_RELEASE_TABLET | Freq: Once | ORAL | Status: AC
  Administered 2017-08-13: 20 meq via ORAL
  Filled 2017-08-13: qty 1

## 2017-08-13 NOTE — ED Notes (Signed)
Patient transported to CT 

## 2017-08-13 NOTE — ED Notes (Signed)
Pt A&O x 3, no distress noted, calm & cooperative at present.  Sleeping at present.  Monitoring for safety, Q 15 min checks in effect.

## 2017-08-13 NOTE — ED Notes (Signed)
TTS Marcus at bedside to eval pt.

## 2017-08-13 NOTE — ED Notes (Signed)
EDP notified of pt BAL 453 mg/dl. No new orders given. Will continue to monitor.

## 2017-08-13 NOTE — ED Notes (Signed)
PT CURRENTLY SITTING UPRIGHT ON SIDE OF BED EATING LUNCH WITHOUT DIFFICULTY. NO COMPLAINTS AT THIS TIME
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
## 2017-08-13 NOTE — ED Provider Notes (Signed)
[name]   CSN: [id number] Arrival date & time: 08/13/17  1208     History   Chief Complaint Chief Complaint  Patient presents with  . Alcohol Intoxication  . Suicidal  . Medical Clearance    HPI Barry Moore is a 52 y.o. male with past medical history of alcoholic hepatitis, alcohol abuse, hypertension, pancreatitis, seizures who presents the emergency department today for alcohol intoxication and suicidal ideation.  His friend recently called 911 earlier today.  EMS arrived and stated the patient gestured "cutting his wrist/arm with a knife".  He admits to this and states that he has been having suicidal ideation for some time. Reports that if he had a knife he would cut himself.  Patient reports he also made gestures to commit a violent towards his friend.  Patient does not remember this.  Patient states that he is drinking 1/5 of liquor as well as three 12 ounce beers today which is normal for him.  Patient is currently intoxicated with slurred speech but able to respond to questions.  He denies any drug use today.  He denies overdosing or taking more medications than prescribed.  Patient has been seen here in the past for similar. Patient states that he may have fallen last night and hit his head but he can't remember how long ago that was. Denies HA, neck pain, weakness or paresthesias. No other complaints at this time.   HPI  Past Medical History:  Diagnosis Date  . Alcoholic hepatitis   . Depression   . ETOH abuse   . Hypertension   . Pancreatitis   . Seizures ([place])   . Suicidal behavior     Patient Active Problem List   Diagnosis Date Noted  . Substance induced mood disorder ([place]) 07/16/2017  . Severe recurrent major depression without psychotic features ([place]) 05/15/2017  . Anemia 03/18/2017  . Acute blood loss anemia 02/21/2017  . UGI bleed 02/08/2017  . Alcohol withdrawal ([place]) 02/08/2017  . Cocaine abuse ([place])  11/18/2016  . GI bleed 11/01/2016  . Alcohol abuse   . Major depressive disorder, recurrent severe without psychotic features ([place]) 07/06/2016  . Alcohol-induced mood disorder ([place]) 06/15/2016  . Seizure ([place]) 05/26/2016  . Tobacco abuse 05/26/2016  . Homeless 05/26/2016  . Essential hypertension 05/26/2016  . Alcohol dependence with alcohol-induced mood disorder ([place]) 02/14/2016  . Severe alcohol withdrawal without perceptual disturbances without complication ([place]) 16/60/6301  . Alcoholic hepatitis without ascites 02/14/2016  . Thrombocytopenia ([place]) 02/14/2016    Past Surgical History:  Procedure Laterality Date  . ESOPHAGOGASTRODUODENOSCOPY (EGD) WITH PROPOFOL N/A 11/02/2016   Procedure: ESOPHAGOGASTRODUODENOSCOPY (EGD) WITH PROPOFOL;  Surgeon: Carol Ada, MD;  Location: [facility] ENDOSCOPY;  Service: Endoscopy;  Laterality: N/A;  . HERNIA REPAIR    . LEG SURGERY          Home Medications    Prior to Admission medications   Medication Sig Start Date End Date Taking? Authorizing Provider  citalopram (CELEXA) 20 MG tablet Take 1 tablet (20 mg total) by mouth daily. For mood control 07/18/17   Money, Lowry Ram, FNP  gabapentin (NEURONTIN) 300 MG capsule Take 1 capsule (300 mg total) by mouth 3 (three) times daily. For withdrawal symptoms 07/18/17   Money, Lowry Ram, FNP  naltrexone (DEPADE) 50 MG tablet Take 1 tablet (50 mg total) by mouth daily. For cravings 07/19/17   Money, Lowry Ram, FNP  traZODone (DESYREL) 100 MG tablet Take 1 tablet (100 mg  total) by mouth at bedtime. For insomnia 07/18/17   Money, Lowry Ram, FNP    Family History Family History  Problem Relation Age of Onset  . Diabetes Mother   . Alcoholism Mother   . Emphysema Father   . Lung cancer Father   . Alcoholism Father     Social History Social History   Tobacco Use  . Smoking status: Current Every Day Smoker    Packs/day: 1.00    Types: Cigarettes  . Smokeless tobacco: Never Used  Substance Use Topics  .  Alcohol use: Yes    Comment: 4-5 40oz beers daily  . Drug use: Yes    Frequency: 3.0 times per week    Types: Cocaine    Comment: last use today 2 weeks ago (04-09-17)      Allergies   Tomato and Aspirin   Review of Systems Review of Systems  All other systems reviewed and are negative.    Physical Exam Updated Vital Signs BP (!) 144/77 (BP Location: Right Arm)   Pulse 96   Temp 98.9 F (37.2 C) (Oral)   Resp 18   Ht 5\' 7"  (1.702 m)   Wt 78.5 kg (173 lb)   SpO2 96%   BMI 27.10 kg/m   Physical Exam  Constitutional: He appears well-developed and well-nourished.  HENT:  Head: Normocephalic and atraumatic.  Right Ear: External ear normal.  Left Ear: External ear normal.  Nose: Nose normal.  Mouth/Throat: Uvula is midline, oropharynx is clear and moist and mucous membranes are normal. No tonsillar exudate.  Eyes: Pupils are equal, round, and reactive to light. Right eye exhibits no discharge. Left eye exhibits no discharge. No scleral icterus.  Neck: Trachea normal. Neck supple. No spinous process tenderness present. No neck rigidity. Normal range of motion present.  Cardiovascular: Normal rate, regular rhythm and intact distal pulses.  No murmur heard. Pulses:      Radial pulses are 2+ on the right side, and 2+ on the left side.       Dorsalis pedis pulses are 2+ on the right side, and 2+ on the left side.       Posterior tibial pulses are 2+ on the right side, and 2+ on the left side.  No lower extremity swelling or edema. Calves symmetric in size bilaterally.  Pulmonary/Chest: Effort normal and breath sounds normal. He exhibits no tenderness.  Abdominal: Soft. Bowel sounds are normal. There is no tenderness. There is no rebound and no guarding.  Musculoskeletal: He exhibits no edema.  Lymphadenopathy:    He has no cervical adenopathy.  Neurological: He is alert.  Slurred speech.  Is responding to questions appropriately.  Awakens with verbal cues.  He was off of 4  extremities without difficulty.  Skin: Skin is warm and dry. No rash noted. He is not diaphoretic.  Psychiatric: He has a normal mood and affect.  Nursing note and vitals reviewed.    ED Treatments / Results  Labs (all labs ordered are listed, but only abnormal results are displayed) Labs Reviewed  COMPREHENSIVE METABOLIC PANEL - Abnormal; Notable for the following components:      Result Value   Potassium 3.3 (*)    Glucose, Bld 100 (*)    Calcium 8.4 (*)    AST 143 (*)    All other components within normal limits  ETHANOL - Abnormal; Notable for the following components:   Alcohol, Ethyl (B) 453 (*)    All other components within normal limits  ACETAMINOPHEN LEVEL - Abnormal; Notable for the following components:   Acetaminophen (Tylenol), Serum <10 (*)    All other components within normal limits  CBC - Abnormal; Notable for the following components:   WBC 3.8 (*)    Hemoglobin 10.4 (*)    HCT 33.6 (*)    MCV 74.7 (*)    MCH 23.1 (*)    RDW 19.1 (*)    Platelets 92 (*)    All other components within normal limits  RAPID URINE DRUG SCREEN, HOSP PERFORMED - Abnormal; Notable for the following components:   Cocaine POSITIVE (*)    Benzodiazepines POSITIVE (*)    All other components within normal limits  SALICYLATE LEVEL    EKG None  Radiology Ct Head Wo Contrast  Result Date: 08/13/2017 CLINICAL DATA:  Altered mental status. Minor head trauma. Alcohol consumption. EXAM: CT HEAD WITHOUT CONTRAST CT CERVICAL SPINE WITHOUT CONTRAST TECHNIQUE: Multidetector CT imaging of the head and cervical spine was performed following the standard protocol without intravenous contrast. Multiplanar CT image reconstructions of the cervical spine were also generated. COMPARISON:  Head CT dated 03/18/2017 and cervical spine CT dated 05/26/2016. FINDINGS: CT HEAD FINDINGS Brain: Diffusely enlarged ventricles and subarachnoid spaces. Patchy white matter low density in both cerebral  hemispheres. No intracranial hemorrhage, mass lesion or CT evidence of acute infarction. Vascular: No hyperdense vessel or unexpected calcification. Skull: Normal. Negative for fracture or focal lesion. Sinuses/Orbits: Unremarkable. Other: There is some motion degradation. CT CERVICAL SPINE FINDINGS Alignment: Stable mild dextroconvex cervicothoracic scoliosis. No subluxations. Skull base and vertebrae: No acute fracture. No primary bone lesion or focal pathologic process. Soft tissues and spinal canal: No prevertebral fluid or swelling. No visible canal hematoma. Disc levels: Mild anterior spur formation and mild posterior disc bulging at the C4-5 level. Minimal anterior spur formation at the C5-6 level. Upper chest: Clear lung apices. Other: Multiple areas of motion degradation. IMPRESSION: 1. Mildly limited examinations due to artifacts as result of the patient moving multiple times during image acquisition. 2. No gross acute abnormality involving the head or cervical spine. 3. Mild cervical spine degenerative changes. 4. Mild diffuse cerebral and cerebellar atrophy and minimal chronic small vessel white matter ischemic changes in both cerebral hemispheres. Electronically Signed   By: Claudie Revering M.D.   On: 08/13/2017 15:06   Ct Cervical Spine Wo Contrast  Result Date: 08/13/2017 CLINICAL DATA:  Altered mental status. Minor head trauma. Alcohol consumption. EXAM: CT HEAD WITHOUT CONTRAST CT CERVICAL SPINE WITHOUT CONTRAST TECHNIQUE: Multidetector CT imaging of the head and cervical spine was performed following the standard protocol without intravenous contrast. Multiplanar CT image reconstructions of the cervical spine were also generated. COMPARISON:  Head CT dated 03/18/2017 and cervical spine CT dated 05/26/2016. FINDINGS: CT HEAD FINDINGS Brain: Diffusely enlarged ventricles and subarachnoid spaces. Patchy white matter low density in both cerebral hemispheres. No intracranial hemorrhage, mass lesion or  CT evidence of acute infarction. Vascular: No hyperdense vessel or unexpected calcification. Skull: Normal. Negative for fracture or focal lesion. Sinuses/Orbits: Unremarkable. Other: There is some motion degradation. CT CERVICAL SPINE FINDINGS Alignment: Stable mild dextroconvex cervicothoracic scoliosis. No subluxations. Skull base and vertebrae: No acute fracture. No primary bone lesion or focal pathologic process. Soft tissues and spinal canal: No prevertebral fluid or swelling. No visible canal hematoma. Disc levels: Mild anterior spur formation and mild posterior disc bulging at the C4-5 level. Minimal anterior spur formation at the C5-6 level. Upper chest: Clear lung apices. Other: Multiple areas  of motion degradation. IMPRESSION: 1. Mildly limited examinations due to artifacts as result of the patient moving multiple times during image acquisition. 2. No gross acute abnormality involving the head or cervical spine. 3. Mild cervical spine degenerative changes. 4. Mild diffuse cerebral and cerebellar atrophy and minimal chronic small vessel white matter ischemic changes in both cerebral hemispheres. Electronically Signed   By: Claudie Revering M.D.   On: 08/13/2017 15:06    Procedures Procedures (including critical care time)  Medications Ordered in ED Medications  LORazepam (ATIVAN) injection 0-4 mg ( Intravenous See Alternative 08/13/17 1757)    Or  LORazepam (ATIVAN) tablet 0-4 mg (2 mg Oral Given 08/13/17 1757)  thiamine (VITAMIN B-1) tablet 100 mg (100 mg Oral Given 08/13/17 1827)    Or  thiamine (B-1) injection 100 mg ( Intravenous See Alternative 08/13/17 1827)  ondansetron (ZOFRAN) tablet 4 mg (has no administration in time range)  citalopram (CELEXA) tablet 20 mg (has no administration in time range)  traZODone (DESYREL) tablet 100 mg (has no administration in time range)  multivitamin with minerals tablet 1 tablet (1 tablet Oral Given 08/13/17 1827)  potassium chloride SA (K-DUR,KLOR-CON) CR  tablet 20 mEq (20 mEq Oral Given 08/13/17 1836)     Initial Impression / Assessment and Plan / ED Course  I have reviewed the triage vital signs and the nursing notes.  Pertinent labs & imaging results that were available during my care of the patient were reviewed by me and considered in my medical decision making (see chart for details).     52 year old male who was brought in by EMS today for alcohol intoxication and suicidal ideation. EMS arrived and stated the patient gestured "cutting his wrist/arm with a knife".  He admits to this and states that he has been having suicidal ideation for some time.  Patient reports he also made gestures to commit a violent towards his friend.  Patient does not remember this.  Patient states that he is drinking 1/5 of liquor as well as three 12 ounce beers today which is normal for him.  Patient is currently intoxicated with slurred speech but able to respond to questions.  He denies any drug use today.  He denies overdosing or taking more medications than prescribed.  Patient has been seen here in the past for similar. Patient states that he may have fallen last night and hit his head but he can't remember how long ago that was. Denies HA, neck pain, weakness or paresthesias. No other complaints at this time.  Patient appears clinically intoxicated on exam.  No focal findings as above.  Vital signs are reassuring.  UDS positive for cocaine and benzos.  Mild hypokalemia that was replaced orally.  No evidence of DKA or alcoholic ketoacidosis.  AST chronically elevated.  LFTs otherwise reassuring.  Alcohol elevated at 453.  Patient is protecting airway.  He responds to verbal stimuli moves all 4 extremities without difficulty.  Acetaminophen and during.  CBC near baseline.  CT head and neck without any acute findings.  Chronic changes as noted above.  Patient was observed for several hours and to clinically able to be cleared.  At time of medical clearance patient is  answering questions in full sentences, following two-step commands, ambulating without difficulty up and down hall.  Will start patient on CIWA protocol. Thiamine and multivitamin ordered. Consult placed to TTS and peers support. Home meds ordered.  Appreciate TTS for seeing the patient.  They recommend overnight observation reassessment in  the morning. Patient signed out to provider default.   Final Clinical Impressions(s) / ED Diagnoses   Final diagnoses:  Alcohol abuse  Suicidal thoughts  Encounter for medical clearance for patient hold    ED Discharge Orders    None       Lorelle Gibbs 08/13/17 2057    Davonna Belling, MD 08/14/17 1244
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
## 2017-08-13 NOTE — ED Notes (Addendum)
EDPA WILL CONSIDER TRANSFERRING PT TO [facility] ONCE ALL PENDING TEST ARE RESULTED. [facility] MADE AWARE

## 2017-08-14 DIAGNOSIS — F17210 Nicotine dependence, cigarettes, uncomplicated: Secondary | ICD-10-CM

## 2017-08-14 DIAGNOSIS — Y907 Blood alcohol level of 200-239 mg/100 ml: Secondary | ICD-10-CM

## 2017-08-14 DIAGNOSIS — Z811 Family history of alcohol abuse and dependence: Secondary | ICD-10-CM

## 2017-08-14 DIAGNOSIS — F1024 Alcohol dependence with alcohol-induced mood disorder: Secondary | ICD-10-CM

## 2017-08-14 DIAGNOSIS — F1490 Cocaine use, unspecified, uncomplicated: Secondary | ICD-10-CM

## 2017-08-14 DIAGNOSIS — Z590 Homelessness: Secondary | ICD-10-CM

## 2017-08-14 DIAGNOSIS — F1390 Sedative, hypnotic, or anxiolytic use, unspecified, uncomplicated: Secondary | ICD-10-CM

## 2017-08-14 MED ORDER — GABAPENTIN 300 MG PO CAPS
300.0000 mg | ORAL_CAPSULE | Freq: Two times a day (BID) | ORAL | Status: DC
  Administered 2017-08-14: 300 mg via ORAL
  Filled 2017-08-14: qty 1

## 2017-08-14 NOTE — B[facility] Suicide Risk Assessment (Cosign Needed)
Suicide Risk Assessment  Discharge Assessment   [facility] Discharge Suicide Risk Assessment   Principal Problem: Alcohol dependence with alcohol-induced mood disorder [facility]) Discharge Diagnoses:  Patient Active Problem List   Diagnosis Date Noted  . Substance induced mood disorder ([place]) [F19.94] 07/16/2017  . Severe recurrent major depression without psychotic features ([place]) [F33.2] 05/15/2017  . Anemia [D64.9] 03/18/2017  . Acute blood loss anemia [D62] 02/21/2017  . UGI bleed [K92.2] 02/08/2017  . Alcohol withdrawal ([place]) [F10.239] 02/08/2017  . Cocaine abuse ([place]) [F14.10] 11/18/2016  . GI bleed [K92.2] 11/01/2016  . Alcohol abuse [F10.10]   . Major depressive disorder, recurrent severe without psychotic features ([place]) [F33.2] 07/06/2016  . 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 Seizure ([place]) [R56.9] 05/26/2016  . Tobacco abuse [Z72.0] 05/26/2016  . Homeless [Z59.0] 05/26/2016  . Essential hypertension [I10] 05/26/2016  . Alcohol dependence with alcohol-induced mood disorder ([place]) [F10.24] 02/14/2016  . Severe alcohol withdrawal without perceptual disturbances without complication ([place]) [U44.034] 02/14/2016  . Alcoholic hepatitis without ascites [K70.10] 02/14/2016  . Thrombocytopenia ([place]) [D69.6] 02/14/2016    Total Time spent with patient: 45 minutes  Musculoskeletal: Strength & Muscle Tone: within normal limits Gait & Station: normal Patient leans: N/A  Psychiatric Specialty Exam: Physical Exam  Constitutional: He is oriented to person, place, and time. He appears well-developed and well-nourished.  Respiratory: Effort normal.  Musculoskeletal: Normal range of motion.  Neurological: He is alert and oriented to person, place, and time.  Psychiatric: His speech is normal and behavior is normal. Thought content normal. Cognition and memory are normal. He expresses impulsivity. He exhibits a depressed mood.   Review of Systems  Psychiatric/Behavioral:  Positive for depression and substance abuse. Negative for hallucinations, memory loss and suicidal ideas. The patient is not nervous/anxious and does not have insomnia.   All other systems reviewed and are negative.  Blood pressure (!) 162/96, pulse 78, temperature 98.5 F (36.9 C), temperature source Oral, resp. rate 18, height 5\' 7"  (1.702 m), weight 173 lb (78.5 kg), SpO2 97 %.Body mass index is 27.1 kg/m. General Appearance: Casual Eye Contact:  Good Speech:  Clear and Coherent Volume:  Normal Mood:  Depressed and Dysphoric Affect:  Congruent and Depressed Thought Process:  Coherent Orientation:  Full (Time, Place, and Person) Thought Content:  Logical Suicidal Thoughts:  No Homicidal Thoughts:  No Memory:  Immediate;   Good Recent;   Good Remote;   Fair Judgement:  Poor Insight:  Shallow Psychomotor Activity:  Tremor Concentration:  Concentration: Good and Attention Span: Good Recall:  Good Fund of Knowledge:  Good Language:  Good Akathisia:  No Handed:  Right AIMS (if indicated):    Assets:  Communication Skills ADL's:  Intact Cognition:  WNL Sleep:   Fair   Mental Status Per Nursing Assessment::   On Admission:   Suicidal ideation while intoxicated  Demographic Factors:  Male, Caucasian, Low socioeconomic status and Unemployed  Loss Factors: Financial problems/change in socioeconomic status  Historical Factors: Impulsivity  Risk Reduction Factors:   Sense of responsibility to family  Continued Clinical Symptoms:  Depression:   Comorbid alcohol abuse/dependence Impulsivity Alcohol/Substance Abuse/Dependencies  Cognitive Features That Contribute To Risk:  Closed-mindedness    Suicide Risk:  Minimal: No identifiable suicidal ideation.  Patients presenting with no risk factors but with morbid ruminations; may be classified as minimal risk based on the severity of the depressive symptoms    Plan Of Care/Follow-up recommendations:  Activity:  as  tolerated  Diet:  Heart  healthy  Ethelene Hal, NP 08/14/2017, 11:37 AM

## 2017-08-14 NOTE — ED Notes (Signed)
Pt discharged safely with resources.  All belongings were returned to patient.  Pt was in no distress. 

## 2017-08-14 NOTE — Consult Note (Addendum)
[facility] Face-to-Face Psychiatry Consult   Reason for Consult:  Suicidal ideation Referring Physician:  EDP Patient Identification: Barry Moore MRN:  [id number] Principal Diagnosis: Alcohol dependence with alcohol-induced mood disorder [facility]) Diagnosis:   Patient Active Problem List   Diagnosis Date Noted  . Substance induced mood disorder ([place]) [F19.94] 07/16/2017  . Severe recurrent major depression without psychotic features ([place]) [F33.2] 05/15/2017  . Anemia [D64.9] 03/18/2017  . Acute blood loss anemia [D62] 02/21/2017  . UGI bleed [K92.2] 02/08/2017  . Alcohol withdrawal ([place]) [F10.239] 02/08/2017  . Cocaine abuse ([place]) [F14.10] 11/18/2016  . GI bleed [K92.2] 11/01/2016  . Alcohol abuse [F10.10]   . Major depressive disorder, recurrent severe without psychotic features ([place]) [F33.2] 07/06/2016  . Alcohol-induced mood disorder ([place]) [F10.94] 06/15/2016  . Seizure ([place]) [R56.9] 05/26/2016  . Tobacco abuse [Z72.0] 05/26/2016  . Homeless [Z59.0] 05/26/2016  . Essential hypertension [I10] 05/26/2016  . Alcohol dependence with alcohol-induced mood disorder ([place]) [F10.24] 02/14/2016  . Severe alcohol withdrawal without perceptual disturbances without complication ([place]) [W40.973] 02/14/2016  . Alcoholic hepatitis without ascites [K70.10] 02/14/2016  . Thrombocytopenia ([place]) [D69.6] 02/14/2016    Total Time spent with patient: 45 minutes  Subjective:   Barry Moore is a 52 y.o. male patient admitted with suicidal ideation while intoxicated.  HPI:  Pt was seen and chart reviewed with treatment team and Dr Darleene Cleaver. Pt denies suicidal/homicidal ideation, denies auditory/visual hallucinations and does not appear to be responding to internal stimuli. Pt stated he was drinking and smoking crack cocaine and doesn't remember how he got to the hospital. [name] called 911 and EMS brought pt to the hospital after he made a suicidal gesture to cut his wrist. Pt is well known to this ED and  has had 28 visits and 4 inpatient admissions in the past six months. Pt often becomes suicidal when he is intoxicated and achieves secondary gain by making suicidal statements or gestures so he can come in to the hospital and sleep, eat, and withdraw from alcohol. Pt's UDS positive for cocaine and benzos, BAL 453. Pt is homeless. Pt to be seen by Peer Support for substance abuse resources. Pt stated he has an appointment at [facility] on Wednesday to see the psychiatrist. Pt is stable and psychiatrically clear for discharge.   Past Psychiatric History: As above   Risk to Self: None Risk to Others: None Prior Inpatient Therapy: Prior Inpatient Therapy: Yes Prior Therapy Dates: 06/2017, 04/2017, multiple admits Prior Therapy Facilty/Provider(s): [facility] Reason for Treatment: MDD, alcohol dependence Prior Outpatient Therapy: Prior Outpatient Therapy: Yes Prior Therapy Dates: unknown Prior Therapy Facilty/Provider(s): facility in [place]  Reason for Treatment: ALCOHOL DEPENDENCE  Does patient have an ACCT team?: No Does patient have Intensive In-House Services?  : No Does patient have [facility] services? : No Does patient have P4CC services?: No  Past Medical History:  Past Medical History:  Diagnosis Date  . Alcoholic hepatitis   . Depression   . ETOH abuse   . Hypertension   . Pancreatitis   . Seizures ([place])   . Suicidal behavior     Past Surgical History:  Procedure Laterality Date  . ESOPHAGOGASTRODUODENOSCOPY (EGD) WITH PROPOFOL N/A 11/02/2016   Procedure: ESOPHAGOGASTRODUODENOSCOPY (EGD) WITH PROPOFOL;  Surgeon: Carol Ada, MD;  Location: [facility];  Service: Endoscopy;  Laterality: N/A;  . HERNIA REPAIR    . LEG SURGERY     Family History:  Family History  Problem Relation Age of Onset  . Diabetes Mother   .  Alcoholism Mother   . Emphysema Father   . Lung cancer Father   . Alcoholism Father    Family Psychiatric  History: Unknown Social History:  Social History    Substance and Sexual Activity  Alcohol Use Yes   Comment: 4-5 40oz beers daily     Social History   Substance and Sexual Activity  Drug Use Yes  . Frequency: 3.0 times per week  . Types: Cocaine   Comment: last use today 2 weeks ago (04-09-17)     Social History   Socioeconomic History  . Marital status: Divorced    Spouse name: Not on file  . Number of children: Not on file  . Years of education: Not on file  . Highest education level: Not on file  Occupational History  . Not on file  Social Needs  . Financial resource strain: Not on file  . Food insecurity:    Worry: Not on file    Inability: Not on file  . Transportation needs:    Medical: Not on file    Non-medical: Not on file  Tobacco Use  . Smoking status: Current Every Day Smoker    Packs/day: 1.00    Types: Cigarettes  . Smokeless tobacco: Never Used  Substance and Sexual Activity  . Alcohol use: Yes    Comment: 4-5 40oz beers daily  . Drug use: Yes    Frequency: 3.0 times per week    Types: Cocaine    Comment: last use today 2 weeks ago (04-09-17)   . Sexual activity: Not Currently  Lifestyle  . Physical activity:    Days per week: Not on file    Minutes per session: Not on file  . Stress: Not on file  Relationships  . Social connections:    Talks on phone: Not on file    Gets together: Not on file    Attends religious service: Not on file    Active member of club or organization: Not on file    Attends meetings of clubs or organizations: Not on file    Relationship status: Not on file  Other Topics Concern  . Not on file  Social History Narrative  . Not on file   Additional Social History:    Allergies:   Allergies  Allergen Reactions  . Tomato Shortness Of Breath and Nausea And Vomiting  . Aspirin Nausea And Vomiting    Labs:  Results for orders placed or performed during the hospital encounter of 08/13/17 (from the past 48 hour(s))  Comprehensive metabolic panel     Status:  Abnormal   Collection Time: 08/13/17 12:33 PM  Result Value Ref Range   Sodium 140 135 - 145 mmol/L   Potassium 3.3 (L) 3.5 - 5.1 mmol/L   Chloride 105 101 - 111 mmol/L   CO2 22 22 - 32 mmol/L   Glucose, Bld 100 (H) 65 - 99 mg/dL   BUN 8 6 - 20 mg/dL   Creatinine, Ser 0.83 0.61 - 1.24 mg/dL   Calcium 8.4 (L) 8.9 - 10.3 mg/dL   Total Protein 8.1 6.5 - 8.1 g/dL   Albumin 4.3 3.5 - 5.0 g/dL   AST 143 (H) 15 - 41 U/L   ALT 58 17 - 63 U/L   Alkaline Phosphatase 119 38 - 126 U/L   Total Bilirubin 0.9 0.3 - 1.2 mg/dL   GFR calc non Af Amer >60 >60 mL/min   GFR calc Af Amer >60 >60 mL/min  Comment: (NOTE) The eGFR has been calculated using the CKD EPI equation. This calculation has not been validated in all clinical situations. eGFR's persistently <60 mL/min signify possible Chronic Kidney Disease.    Anion gap 13 5 - 15    Comment: Performed at [facility], [place] [address] [place], [place] [address]  Ethanol     Status: Abnormal   Collection Time: 08/13/17 12:33 PM  Result Value Ref Range   Alcohol, Ethyl (B) 453 (HH) <10 mg/dL    Comment: CRITICAL RESULT CALLED TO, READ BACK BY AND VERIFIED WITH: A.STRADER RN 08/13/2017 1415 JR (NOTE) Lowest detectable limit for serum alcohol is 10 mg/dL. For medical purposes only. Performed at [facility], [place] [address] [place], [place] [address]   Salicylate level     Status: None   Collection Time: 08/13/17 12:33 PM  Result Value Ref Range   Salicylate Lvl <9.9 2.8 - 30.0 mg/dL    Comment: Performed at [facility], [place] [address], [place], [place] [address]  Acetaminophen level     Status: Abnormal   Collection Time: 08/13/17 12:33 PM  Result Value Ref Range   Acetaminophen (Tylenol), Serum <10 (L) 10 - 30 ug/mL    Comment: (NOTE) Therapeutic concentrations vary significantly. A range of 10-30 ug/mL  may be an effective concentration for many patients. However, some  are  best treated at concentrations outside of this range. Acetaminophen concentrations >150 ug/mL at 4 hours after ingestion  and >50 ug/mL at 12 hours after ingestion are often associated with  toxic reactions. Performed at [facility], [place] [address] [place], [place] [address]   cbc     Status: Abnormal   Collection Time: 08/13/17 12:33 PM  Result Value Ref Range   WBC 3.8 (L) 4.0 - 10.5 K/uL   RBC 4.50 4.22 - 5.81 MIL/uL   Hemoglobin 10.4 (L) 13.0 - 17.0 g/dL   HCT 33.6 (L) 39.0 - 52.0 %   MCV 74.7 (L) 78.0 - 100.0 fL   MCH 23.1 (L) 26.0 - 34.0 pg   MCHC 31.0 30.0 - 36.0 g/dL   RDW 19.1 (H) 11.5 - 15.5 %   Platelets 92 (L) 150 - 400 K/uL    Comment: SPECIMEN CHECKED FOR CLOTS REPEATED TO VERIFY PLATELET COUNT CONFIRMED BY SMEAR Performed at [place] [address] [place],  [address]   Rapid urine drug screen (hospital performed)     Status: Abnormal   Collection Time: 08/13/17  1:40 PM  Result Value Ref Range   Opiates NONE DETECTED NONE DETECTED   Cocaine POSITIVE (A) NONE DETECTED   Benzodiazepines POSITIVE (A) NONE DETECTED   Amphetamines NONE DETECTED NONE DETECTED   Tetrahydrocannabinol NONE DETECTED NONE DETECTED   Barbiturates NONE DETECTED NONE DETECTED    Comment: (NOTE) DRUG SCREEN FOR MEDICAL PURPOSES ONLY.  IF CONFIRMATION IS NEEDED FOR ANY PURPOSE, NOTIFY LAB WITHIN 5 DAYS. LOWEST DETECTABLE LIMITS FOR URINE DRUG SCREEN Drug Class                     Cutoff (ng/mL) Amphetamine and metabolites    1000 Barbiturate and metabolites    200 Benzodiazepine                 233 Tricyclics and metabolites     300 Opiates and metabolites        300 Cocaine and metabolites  300 THC                            50 Performed at [facility], [place] [address], [place], [place] [address]     Current Facility-Administered Medications  Medication Dose Route Frequency Provider Last Rate Last Dose   . citalopram (CELEXA) tablet 20 mg  20 mg Oral Daily Jillyn Ledger, PA-C   20 mg at 08/14/17 1053  . gabapentin (NEURONTIN) capsule 300 mg  300 mg Oral BID Darleene Cleaver, Elizabethanne Lusher, MD   300 mg at 08/14/17 1054  . ondansetron (ZOFRAN) tablet 4 mg  4 mg Oral Q8H PRN Maczis, Barth Kirks, PA-C      . thiamine (VITAMIN B-1) tablet 100 mg  100 mg Oral Daily Jillyn Ledger, PA-C   100 mg at 08/14/17 1054   Or  . thiamine (B-1) injection 100 mg  100 mg Intravenous Daily Maczis, Barth Kirks, PA-C      . traZODone (DESYREL) tablet 100 mg  100 mg Oral QHS PRN Jillyn Ledger, PA-C   100 mg at 08/13/17 2127   Current Outpatient Medications  Medication Sig Dispense Refill  . citalopram (CELEXA) 20 MG tablet Take 1 tablet (20 mg total) by mouth daily. For mood control 30 tablet 0  . gabapentin (NEURONTIN) 300 MG capsule Take 1 capsule (300 mg total) by mouth 3 (three) times daily. For withdrawal symptoms 90 capsule 0  . traZODone (DESYREL) 100 MG tablet Take 1 tablet (100 mg total) by mouth at bedtime. For insomnia 30 tablet 0  . naltrexone (DEPADE) 50 MG tablet Take 1 tablet (50 mg total) by mouth daily. For cravings (Patient not taking: Reported on 08/13/2017) 30 tablet 0    Musculoskeletal: Strength & Muscle Tone: within normal limits Gait & Station: normal Patient leans: N/A  Psychiatric Specialty Exam: Physical Exam  Constitutional: He is oriented to person, place, and time. He appears well-developed and well-nourished.  Respiratory: Effort normal.  Musculoskeletal: Normal range of motion.  Neurological: He is alert and oriented to person, place, and time.  Psychiatric: His speech is normal and behavior is normal. Thought content normal. Cognition and memory are normal. He expresses impulsivity. He exhibits a depressed mood.    Review of Systems  Psychiatric/Behavioral: Positive for depression and substance abuse. Negative for hallucinations, memory loss and suicidal ideas. The patient is not  nervous/anxious and does not have insomnia.   All other systems reviewed and are negative.   Blood pressure (!) 162/96, pulse 78, temperature 98.5 F (36.9 C), temperature source Oral, resp. rate 18, height 5' 7"  (1.702 m), weight 173 lb (78.5 kg), SpO2 97 %.Body mass index is 27.1 kg/m.  General Appearance: Casual  Eye Contact:  Good  Speech:  Clear and Coherent  Volume:  Normal  Mood:  Depressed and Dysphoric  Affect:  Congruent and Depressed  Thought Process:  Coherent  Orientation:  Full (Time, Place, and Person)  Thought Content:  Logical  Suicidal Thoughts:  No  Homicidal Thoughts:  No  Memory:  Immediate;   Good Recent;   Good Remote;   Fair  Judgement:  Poor  Insight:  Shallow  Psychomotor Activity:  Tremor  Concentration:  Concentration: Good and Attention Span: Good  Recall:  Good  Fund of Knowledge:  Good  Language:  Good  Akathisia:  No  Handed:  Right  AIMS (if indicated):     Assets:  Communication Skills  ADL's:  Intact  Cognition:  WNL  Sleep:   Fair     Treatment Plan Summary: Plan Alcohol dependence with alcohol-induced mood disorder ([place])  Discharge home Take all medications as prescribed Avoid the use of alcohol and illicit drugs Follow up with [facility] for medication management and substance abuse assistance   Disposition: No evidence of imminent risk to self or others at present.   Patient does not meet criteria for psychiatric inpatient admission. Supportive therapy provided about ongoing stressors. Discussed crisis plan, support from social network, calling 911, coming to the Emergency Department, and calling Suicide Hotline.  Ethelene Hal, NP 08/14/2017 11:11 AM  Patient seen face-to-face for psychiatric evaluation, chart reviewed and case discussed with the physician extender and developed treatment plan. Reviewed the information documented and agree with the treatment plan. Corena Pilgrim, MD

## 2017-08-14 NOTE — BHH Counselor (Addendum)
08/14/17 - Jinny Blossom NP and Dr Darleene Cleaver recommend discharge.  Arnold Long, [place] Therapeutic Triage Specialist

## 2017-08-14 NOTE — Patient Outreach (Signed)
CPSS met with the patient and provided substance use recovery support. CPSS has met with the patient multiple times. CPSS strongly suggested that the patient continue stay in contact for CPSS for continued substance use recovery support. CPSS provided AA meeting list, outpatient/residential substance use treatment center list, and CPSS contact information. CPSS also strongly suggested the patient attend some AA meetings for further substance use recovery support from other people with long-term recovery experience from alcoholism.

## 2017-08-14 NOTE — Discharge Instructions (Signed)
Follow up:   [facility] [address] [place], [place] [address] [phone] Description: A variety of behavioral health services are offered, including: Open Access  Outpatient Therapy & Psychiatric Services  Assertive Community Treatment Team (ACTT) (adults only)  [place] (children & adults)  Assertive Engagement (children & adults)  Peer Support Services Referrals may be made to the facility-based crisis center 24/7, 365 days a year. Walk-ins are always welcome or you may call ahead to speak with a [facility] staff member regarding availability (preferred method).  [facility] [place], [place] [address] Office: [phone]   Fax: [phone] [ Google Maps Link ]  Prevention & Early Intervention Services Outpatient Counseling Opioid Treatment Program

## 2017-08-18 ENCOUNTER — Encounter (HOSPITAL_COMMUNITY): Payer: Self-pay

## 2017-08-18 ENCOUNTER — Emergency Department (HOSPITAL_COMMUNITY)
Admission: EM | Admit: 2017-08-18 | Discharge: 2017-08-19 | Disposition: A | Discharge status: Home / Self Care | Payer: Self-pay | Attending: Emergency Medicine | Admitting: Emergency Medicine

## 2017-08-18 ENCOUNTER — Emergency Department (HOSPITAL_COMMUNITY): Payer: Self-pay

## 2017-08-18 DIAGNOSIS — M79661 Pain in right lower leg: Secondary | ICD-10-CM | POA: Insufficient documentation

## 2017-08-18 DIAGNOSIS — Y908 Blood alcohol level of 240 mg/100 ml or more: Secondary | ICD-10-CM | POA: Insufficient documentation

## 2017-08-18 DIAGNOSIS — F329 Major depressive disorder, single episode, unspecified: Secondary | ICD-10-CM | POA: Insufficient documentation

## 2017-08-18 DIAGNOSIS — R45851 Suicidal ideations: Secondary | ICD-10-CM | POA: Insufficient documentation

## 2017-08-18 DIAGNOSIS — F10920 Alcohol use, unspecified with intoxication, uncomplicated: Secondary | ICD-10-CM | POA: Insufficient documentation

## 2017-08-18 DIAGNOSIS — I10 Essential (primary) hypertension: Secondary | ICD-10-CM | POA: Insufficient documentation

## 2017-08-18 DIAGNOSIS — M79604 Pain in right leg: Secondary | ICD-10-CM

## 2017-08-18 DIAGNOSIS — Z79899 Other long term (current) drug therapy: Secondary | ICD-10-CM | POA: Insufficient documentation

## 2017-08-18 DIAGNOSIS — F17210 Nicotine dependence, cigarettes, uncomplicated: Secondary | ICD-10-CM | POA: Insufficient documentation

## 2017-08-18 NOTE — ED Provider Notes (Signed)
[facility] DEPT Provider Note   CSN: [id number] Arrival date & time: 08/18/17  1941     History   Chief Complaint Chief Complaint  Patient presents with  . Suicidal  . right leg pain    HPI Barry Moore is a 52 y.o. male.  HPI Patient is a 52 year old male with history of alcoholic hepatitis, alcohol abuse, HTN, pancreatitis, seizures who presents emergency department for alcohol intoxication, right leg pain, and suicidal ideation.  [place] police were called to check on patient in the parking lot that he lives in.  They found the patient to be complaining of right leg pain and threatening to kill himself.  Patient reports he is having thoughts of cutting himself with a knife.  He does admit to drinking alcohol today.  He denies any homicidal ideation, auditory or visual hallucinations.    Past Medical History:  Diagnosis Date  . Alcoholic hepatitis   . Depression   . ETOH abuse   . Hypertension   . Pancreatitis   . Seizures ([place])   . Suicidal behavior     Patient Active Problem List   Diagnosis Date Noted  . Substance induced mood disorder ([place]) 07/16/2017  . Severe recurrent major depression without psychotic features ([place]) 05/15/2017  . Anemia 03/18/2017  . Acute blood loss anemia 02/21/2017  . UGI bleed 02/08/2017  . Alcohol withdrawal ([place]) 02/08/2017  . Cocaine abuse ([place]) 11/18/2016  . GI bleed 11/01/2016  . Alcohol abuse   . Major depressive disorder, recurrent severe without psychotic features ([place]) 07/06/2016  . Alcohol-induced mood disorder ([place]) 06/15/2016  . Seizure ([place]) 05/26/2016  . Tobacco abuse 05/26/2016  . Homeless 05/26/2016  . Essential hypertension 05/26/2016  . Alcohol dependence with alcohol-induced mood disorder ([place]) 02/14/2016  . Severe alcohol withdrawal without perceptual disturbances without complication ([place]) 99/24/2683  . Alcoholic hepatitis without ascites 02/14/2016  . Thrombocytopenia ([place])  02/14/2016    Past Surgical History:  Procedure Laterality Date  . ESOPHAGOGASTRODUODENOSCOPY (EGD) WITH PROPOFOL N/A 11/02/2016   Procedure: ESOPHAGOGASTRODUODENOSCOPY (EGD) WITH PROPOFOL;  Surgeon: Carol Ada, MD;  [facility];  Service: Endoscopy;  Laterality: N/A;  . HERNIA REPAIR    . LEG SURGERY          Home Medications    Prior to Admission medications   Medication Sig Start Date End Date Taking? Authorizing Provider  citalopram (CELEXA) 20 MG tablet Take 1 tablet (20 mg total) by mouth daily. For mood control 07/18/17  Yes Money, Lowry Ram, FNP  gabapentin (NEURONTIN) 300 MG capsule Take 1 capsule (300 mg total) by mouth 3 (three) times daily. For withdrawal symptoms 07/18/17  Yes Money, Lowry Ram, FNP  traZODone (DESYREL) 100 MG tablet Take 1 tablet (100 mg total) by mouth at bedtime. For insomnia 07/18/17  Yes Money, Lowry Ram, FNP  naltrexone (DEPADE) 50 MG tablet Take 1 tablet (50 mg total) by mouth daily. For cravings Patient not taking: Reported on 08/13/2017 07/19/17   Money, Lowry Ram, FNP    Family History Family History  Problem Relation Age of Onset  . Diabetes Mother   . Alcoholism Mother   . Emphysema Father   . Lung cancer Father   . Alcoholism Father     Social History Social History   Tobacco Use  . Smoking status: Current Every Day Smoker    Packs/day: 1.00    Types: Cigarettes  . Smokeless tobacco: Never Used  Substance Use Topics  . Alcohol use: Yes  Comment: 4-5 40oz beers daily  . Drug use: Yes    Frequency: 3.0 times per week    Types: Cocaine    Comment: last use today 2 weeks ago (04-09-17)      Allergies   Tomato and Aspirin   Review of Systems Review of Systems  Constitutional: Negative for chills and fever.  HENT: Negative for ear pain and sore throat.   Eyes: Negative for pain and visual disturbance.  Respiratory: Negative for cough and shortness of breath.   Cardiovascular: Negative for chest pain and  palpitations.  Gastrointestinal: Negative for abdominal pain and vomiting.  Genitourinary: Negative for dysuria and hematuria.  Musculoskeletal: Positive for gait problem. Negative for arthralgias and back pain.  Skin: Negative for color change and rash.  Neurological: Negative for seizures and syncope.  Psychiatric/Behavioral: Positive for suicidal ideas.  All other systems reviewed and are negative.    Physical Exam Updated Vital Signs BP 138/86 (BP Location: Right Arm)   Pulse 83   Temp 97.7 F (36.5 C) (Oral)   Resp 14   SpO2 98%   Physical Exam  Constitutional: He appears well-developed and well-nourished.  HENT:  Head: Normocephalic and atraumatic.  Eyes: Conjunctivae are normal.  Neck: Neck supple.  Cardiovascular: Normal rate and regular rhythm.  No murmur heard. Pulmonary/Chest: Effort normal and breath sounds normal. No respiratory distress.  Abdominal: Soft. There is no tenderness.  Musculoskeletal: He exhibits tenderness (right lower extremity from knee to level of ankle with ttp. No obvious trauma, deformity, ecchymoses or erythema noted.). He exhibits no edema.  Neurological: He is alert.  Skin: Skin is warm and dry.  Psychiatric: He has a normal mood and affect. His speech is slurred. He is slowed. Cognition and memory are impaired. He expresses impulsivity. He expresses suicidal ideation. He expresses no homicidal ideation. He expresses suicidal plans. He expresses no homicidal plans. He is inattentive.  Nursing note and vitals reviewed.    ED Treatments / Results  Labs (all labs ordered are listed, but only abnormal results are displayed) Labs Reviewed  CBC WITH DIFFERENTIAL/PLATELET  COMPREHENSIVE METABOLIC PANEL  SALICYLATE LEVEL  ACETAMINOPHEN LEVEL  RAPID URINE DRUG SCREEN, HOSP PERFORMED  ETHANOL    EKG None  Radiology Dg Knee 2 Views Right  Result Date: 08/18/2017 CLINICAL DATA:  [place] 2 days ago.  Knee pain. EXAM: RIGHT KNEE - 1-2 VIEW  COMPARISON:  None. FINDINGS: No evidence of fracture, dislocation, or joint effusion. No evidence of arthropathy or other focal bone abnormality. Soft tissues are unremarkable. IMPRESSION: Negative. Electronically Signed   By: Nelson Chimes M.D.   On: 08/18/2017 20:40   Dg Tibia/fibula Right  Result Date: 08/18/2017 CLINICAL DATA:  [place] 2 days ago. EXAM: RIGHT TIBIA AND FIBULA - 2 VIEW COMPARISON:  None. FINDINGS: There is no evidence of fracture or other focal bone lesions. Soft tissues are unremarkable. IMPRESSION: Negative. Electronically Signed   By: Nelson Chimes M.D.   On: 08/18/2017 20:41    Procedures Procedures (including critical care time)  Medications Ordered in ED Medications - No data to display   Initial Impression / Assessment and Plan / ED Course  I have reviewed the triage vital signs and the nursing notes.  Pertinent labs & imaging results that were available during my care of the patient were reviewed by me and considered in my medical decision making (see chart for details).    Patient is a 52 year old male with history of alcoholic hepatitis, alcohol abuse, HTN,  pancreatitis, seizures who presents emergency department for alcohol intoxication, right leg pain, and suicidal ideation.   Imaging negative for acute injury to right lower extremity.   Clinically intoxicated on exam. Pending labs and reassessment for suicidality. Please see ongoing provider note for final patient disposition.   Patient and plan of care discussed with Attending physician, Dr. Alvino Chapel.    Final Clinical Impressions(s) / ED Diagnoses   Final diagnoses:  Suicidal ideation  Alcoholic intoxication without complication [facility])    ED Discharge Orders    None       Arnetha Massy, MD 08/19/17 8638    Davonna Belling, MD 08/22/17 2231

## 2017-08-18 NOTE — ED Triage Notes (Signed)
[employer] was called out to check on patient in the parking lot that he lives in and they called EMS because he was complaining about right leg pain and saying that he wanted to kill himself, EMS took his knife from him and left it with his friend Pt is intoxicated

## 2017-08-19 LAB — CBC WITH DIFFERENTIAL/PLATELET
Basophils Absolute: 0 10*3/uL (ref 0.0–0.1)
Basophils Relative: 1 %
EOS PCT: 6 %
Eosinophils Absolute: 0.2 10*3/uL (ref 0.0–0.7)
HEMATOCRIT: 30.5 % — AB (ref 39.0–52.0)
Hemoglobin: 9.2 g/dL — ABNORMAL LOW (ref 13.0–17.0)
LYMPHS ABS: 1.1 10*3/uL (ref 0.7–4.0)
LYMPHS PCT: 35 %
MCH: 22.9 pg — AB (ref 26.0–34.0)
MCHC: 30.2 g/dL (ref 30.0–36.0)
MCV: 76.1 fL — AB (ref 78.0–100.0)
MONO ABS: 0.6 10*3/uL (ref 0.1–1.0)
Monocytes Relative: 19 %
Neutro Abs: 1.2 10*3/uL — ABNORMAL LOW (ref 1.7–7.7)
Neutrophils Relative %: 39 %
PLATELETS: 112 10*3/uL — AB (ref 150–400)
RBC: 4.01 MIL/uL — ABNORMAL LOW (ref 4.22–5.81)
RDW: 19.4 % — AB (ref 11.5–15.5)
WBC: 3.1 10*3/uL — ABNORMAL LOW (ref 4.0–10.5)

## 2017-08-19 LAB — RAPID URINE DRUG SCREEN, HOSP PERFORMED
Amphetamines: NOT DETECTED
BARBITURATES: NOT DETECTED
BENZODIAZEPINES: POSITIVE — AB
COCAINE: POSITIVE — AB
Opiates: NOT DETECTED
Tetrahydrocannabinol: NOT DETECTED

## 2017-08-19 LAB — COMPREHENSIVE METABOLIC PANEL
ALT: 61 U/L (ref 17–63)
AST: 137 U/L — ABNORMAL HIGH (ref 15–41)
Albumin: 3.8 g/dL (ref 3.5–5.0)
Alkaline Phosphatase: 113 U/L (ref 38–126)
Anion gap: 8 (ref 5–15)
BILIRUBIN TOTAL: 0.5 mg/dL (ref 0.3–1.2)
BUN: 8 mg/dL (ref 6–20)
CHLORIDE: 109 mmol/L (ref 101–111)
CO2: 24 mmol/L (ref 22–32)
CREATININE: 0.78 mg/dL (ref 0.61–1.24)
Calcium: 8 mg/dL — ABNORMAL LOW (ref 8.9–10.3)
Glucose, Bld: 92 mg/dL (ref 65–99)
POTASSIUM: 3.3 mmol/L — AB (ref 3.5–5.1)
Sodium: 141 mmol/L (ref 135–145)
TOTAL PROTEIN: 7.4 g/dL (ref 6.5–8.1)

## 2017-08-19 LAB — ETHANOL: ALCOHOL ETHYL (B): 343 mg/dL — AB (ref <10)

## 2017-08-19 LAB — SALICYLATE LEVEL: Salicylate Lvl: <7 mg/dL (ref 2.8–30.0)

## 2017-08-19 LAB — ACETAMINOPHEN LEVEL

## 2017-08-19 MED ORDER — THIAMINE HCL 100 MG/ML IJ SOLN: 100.0000 mg | Freq: Every day | INTRAMUSCULAR | Status: DC

## 2017-08-19 MED ORDER — NICOTINE 21 MG/24HR TD PT24: 21.0000 mg | MEDICATED_PATCH | Freq: Every day | TRANSDERMAL | Status: DC

## 2017-08-19 MED ORDER — ONDANSETRON HCL 4 MG PO TABS: 4.0000 mg | ORAL_TABLET | Freq: Three times a day (TID) | ORAL | Status: DC | PRN

## 2017-08-19 MED ORDER — ALUM & MAG HYDROXIDE-SIMETH 200-200-20 MG/5ML PO SUSP: 30.0000 mL | Freq: Four times a day (QID) | ORAL | Status: DC | PRN

## 2017-08-19 MED ORDER — LORAZEPAM 2 MG/ML IJ SOLN: 0.0000 mg | Freq: Four times a day (QID) | INTRAMUSCULAR | Status: DC

## 2017-08-19 MED ORDER — VITAMIN B-1 100 MG PO TABS: 100.0000 mg | ORAL_TABLET | Freq: Every day | ORAL | Status: DC

## 2017-08-19 MED ORDER — LORAZEPAM 1 MG PO TABS
0.0000 mg | ORAL_TABLET | Freq: Four times a day (QID) | ORAL | Status: DC
  Administered 2017-08-19: 1 mg via ORAL
  Filled 2017-08-19: qty 1

## 2017-08-19 NOTE — ED Provider Notes (Signed)
7:07 AM Patient care assumed from Dr. Massie Maroon change of shift.  Patient pending sobering.  He has been seen 28 times in the emergency department in the past 6 months.  Presenting today for leg pain and suicidal ideations.  Imaging of the lower extremity has not shown any acute process.  He was allowed to sober in the emergency department.  Ativan given at 6 AM in conjunction with CIWA assessment.  On reassessment of the patient, he is awake and alert.  When asked if he is suicidal he states "not really".  He was last evaluated by behavioral health 5 days ago.  Patient diagnosed with alcohol induced mood disorder.  I believe he is stable for discharge at this time.  Patient told to return for any new or concerning symptoms.  Vitals:   08/19/17 0005 08/19/17 0319 08/19/17 0535 08/19/17 0638  BP: 138/86 (!) 142/94 (!) 142/94 (!) 152/97  Pulse: 83 86 86 88  Resp: 14 17  13   Temp: 97.7 F (36.5 C) 97.7 F (36.5 C)  97.8 F (36.6 C)  TempSrc: Oral Oral  Oral  SpO2: 98% 97%  97%      Antonietta Breach, PA-C 08/19/17 0709    Shanon Rosser, MD 08/19/17 [phone]

## 2017-08-23 ENCOUNTER — Emergency Department (HOSPITAL_COMMUNITY)
Admission: EM | Admit: 2017-08-23 | Discharge: 2017-08-24 | Disposition: A | Discharge status: Home / Self Care | Payer: Self-pay | Attending: Emergency Medicine | Admitting: Emergency Medicine

## 2017-08-23 ENCOUNTER — Encounter (HOSPITAL_COMMUNITY): Payer: Self-pay | Admitting: *Deleted

## 2017-08-23 DIAGNOSIS — F332 Major depressive disorder, recurrent severe without psychotic features: Secondary | ICD-10-CM | POA: Insufficient documentation

## 2017-08-23 DIAGNOSIS — Z79899 Other long term (current) drug therapy: Secondary | ICD-10-CM | POA: Insufficient documentation

## 2017-08-23 DIAGNOSIS — I10 Essential (primary) hypertension: Secondary | ICD-10-CM | POA: Insufficient documentation

## 2017-08-23 DIAGNOSIS — F17210 Nicotine dependence, cigarettes, uncomplicated: Secondary | ICD-10-CM | POA: Insufficient documentation

## 2017-08-23 DIAGNOSIS — F1020 Alcohol dependence, uncomplicated: Secondary | ICD-10-CM | POA: Insufficient documentation

## 2017-08-23 DIAGNOSIS — Z590 Homelessness: Secondary | ICD-10-CM | POA: Insufficient documentation

## 2017-08-23 DIAGNOSIS — R45851 Suicidal ideations: Secondary | ICD-10-CM | POA: Insufficient documentation

## 2017-08-23 DIAGNOSIS — F1910 Other psychoactive substance abuse, uncomplicated: Secondary | ICD-10-CM

## 2017-08-23 LAB — COMPREHENSIVE METABOLIC PANEL
ALT: 52 U/L (ref 17–63)
AST: 120 U/L — AB (ref 15–41)
Albumin: 3.9 g/dL (ref 3.5–5.0)
Alkaline Phosphatase: 120 U/L (ref 38–126)
Anion gap: 10 (ref 5–15)
BUN: 6 mg/dL (ref 6–20)
CALCIUM: 8.8 mg/dL — AB (ref 8.9–10.3)
CHLORIDE: 102 mmol/L (ref 101–111)
CO2: 24 mmol/L (ref 22–32)
Creatinine, Ser: 0.88 mg/dL (ref 0.61–1.24)
Glucose, Bld: 107 mg/dL — ABNORMAL HIGH (ref 65–99)
Potassium: 3.8 mmol/L (ref 3.5–5.1)
Sodium: 136 mmol/L (ref 135–145)
Total Bilirubin: 0.8 mg/dL (ref 0.3–1.2)
Total Protein: 7.5 g/dL (ref 6.5–8.1)

## 2017-08-23 LAB — RAPID URINE DRUG SCREEN, HOSP PERFORMED
Amphetamines: NOT DETECTED
BENZODIAZEPINES: NOT DETECTED
Barbiturates: NOT DETECTED
Cocaine: POSITIVE — AB
OPIATES: NOT DETECTED
Tetrahydrocannabinol: NOT DETECTED

## 2017-08-23 LAB — CBC
HCT: 32.2 % — ABNORMAL LOW (ref 39.0–52.0)
Hemoglobin: 9.9 g/dL — ABNORMAL LOW (ref 13.0–17.0)
MCH: 23.1 pg — AB (ref 26.0–34.0)
MCHC: 30.7 g/dL (ref 30.0–36.0)
MCV: 75.1 fL — AB (ref 78.0–100.0)
Platelets: 137 10*3/uL — ABNORMAL LOW (ref 150–400)
RBC: 4.29 MIL/uL (ref 4.22–5.81)
RDW: 19.7 % — AB (ref 11.5–15.5)
WBC: 4.7 10*3/uL (ref 4.0–10.5)

## 2017-08-23 NOTE — ED Triage Notes (Addendum)
Homeless pt picked up by ems due to pt having "hiccups for a month", pain in right hip and would like help to stop drinking because he states that he can not do it by himself.  Pt has hx of seizures and ETOH abuse.  CBG 104

## 2017-08-23 NOTE — ED Notes (Signed)
Pt given paper scrubs to change into 

## 2017-08-24 ENCOUNTER — Other Ambulatory Visit: Payer: Self-pay

## 2017-08-24 ENCOUNTER — Encounter (HOSPITAL_COMMUNITY): Payer: Self-pay | Admitting: Behavioral Health

## 2017-08-24 LAB — ETHANOL: Alcohol, Ethyl (B): 371 mg/dL (ref <10)

## 2017-08-24 MED ORDER — LORAZEPAM 1 MG PO TABS: 0.0000 mg | ORAL_TABLET | Freq: Two times a day (BID) | ORAL | Status: DC

## 2017-08-24 MED ORDER — LORAZEPAM 2 MG/ML IJ SOLN: 0.0000 mg | Freq: Two times a day (BID) | INTRAMUSCULAR | Status: DC

## 2017-08-24 MED ORDER — LORAZEPAM 2 MG/ML IJ SOLN: 0.0000 mg | Freq: Four times a day (QID) | INTRAMUSCULAR | Status: DC

## 2017-08-24 MED ORDER — LORAZEPAM 1 MG PO TABS
0.0000 mg | ORAL_TABLET | Freq: Four times a day (QID) | ORAL | Status: DC
  Administered 2017-08-24: 1 mg via ORAL
  Filled 2017-08-24: qty 1

## 2017-08-24 MED ORDER — THIAMINE HCL 100 MG/ML IJ SOLN: 100.0000 mg | Freq: Every day | INTRAMUSCULAR | Status: DC

## 2017-08-24 MED ORDER — VITAMIN B-1 100 MG PO TABS
100.0000 mg | ORAL_TABLET | Freq: Every day | ORAL | Status: DC
  Administered 2017-08-24: 100 mg via ORAL
  Filled 2017-08-24: qty 1

## 2017-08-24 NOTE — BH Assessment (Signed)
Pt is in the hallway. RN states he will call once the pt is in a room.

## 2017-08-24 NOTE — ED Notes (Signed)
Called received from [place] at [facility]. Patient recommended for discharge. Miller aware.

## 2017-08-24 NOTE — Consult Note (Signed)
  Tele Assessment  Barry Moore, 52 y.o., male patient presented to [facility] intoxicated with complaints of suicidal ideation.  Patient seen via telepsych TTS and by this provider; chart reviewed and consulted with Dr. Dwyane Dee on 08/24/17.  On evaluation Barry Moore reports that he was depressed and having suicidal thoughts when he came to the hospital.  Patient states that his suicidal thoughts are more like he doesn't care if he lives/dies, or if he doesn't wake from sleep.  Patient states that he has no plans to kill himself but his stressors are his drug, alcohol use and having to live in a tent.  Patient endorses passive suicidal ideation with no plan or intent.  Patient denies homicidal ideation, psychosis, and paranoia.  Patient states that he has case worker at [facility] that comes to visit every Tuesday and is assisting him in finding housing.  Patient states that he has not been going to [facility] for outpatient services because he has no transportation and no other way of getting there.  Discussed ACTT team with patient and states that he is interested if he is eligible.   During evaluation Barry Moore is alert/oriented x 4; calm/cooperative; and mood is congruent with affect.  He does not appear to be responding to internal/external stimuli or delusional thoughts.  Patient denies active suicidal thoughts with intent/plan.  Patient also denies homicidal ideation, psychosis, and paranoia.  Patient seeking assistance with substance use disorder; sent information to [facility] which had 4 bed available this morning; patient may need to follow up.  Also checking to see if patient is eligible for ACTT and may be able to visit while patient in ED to do assessment.  Patient answered question appropriately.  Patient psychiatrically cleared.    Recommendation:   Patient psychiatrically cleared.  If possible to keep patient in ED until information from [facility] has come in and ACTT team information; if eligible an  appointment or meeting can be set up with patient .  Social worker will inform patient of that disposition.    Disposition: No evidence of imminent risk to self or others at present.   Patient does not meet criteria for psychiatric inpatient admission.  Barry Birkland B. Khyri Hinzman, NP

## 2017-08-24 NOTE — ED Provider Notes (Signed)
[facility] EMERGENCY DEPARTMENT Provider Note   CSN: [id number] Arrival date & time: 08/23/17  2234     History   Chief Complaint Chief Complaint  Patient presents with  . Medical Clearance    HPI Barry Moore is a 52 y.o. male.  Patient is a 52 year old male with past medical history of alcoholism, hypertension, homelessness, and depression/suicidal ideation.  He presents today with complaints of suicidal thoughts and wanting help with his alcoholism.  He reports drinking heavily on a daily basis.  He states his alcohol issues and living situation as stressors.  He is currently homeless and tells me that he [place].  The history is provided by the patient.    Past Medical History:  Diagnosis Date  . Alcoholic hepatitis   . Depression   . ETOH abuse   . Hypertension   . Pancreatitis   . Seizures ([place])   . Suicidal behavior     Patient Active Problem List   Diagnosis Date Noted  . Substance induced mood disorder ([place]) 07/16/2017  . Severe recurrent major depression without psychotic features ([place]) 05/15/2017  . Anemia 03/18/2017  . Acute blood loss anemia 02/21/2017  . UGI bleed 02/08/2017  . Alcohol withdrawal ([place]) 02/08/2017  . Cocaine abuse ([place]) 11/18/2016  . GI bleed 11/01/2016  . Alcohol abuse   . Major depressive disorder, recurrent severe without psychotic features ([place]) 07/06/2016  . 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 Seizure ([place]) 05/26/2016  . Tobacco abuse 05/26/2016  . Homeless 05/26/2016  . Essential hypertension 05/26/2016  . Alcohol dependence with alcohol-induced mood disorder ([place]) 02/14/2016  . Severe alcohol withdrawal without perceptual disturbances without complication ([place]) 58/85/0277  . Alcoholic hepatitis without ascites 02/14/2016  . Thrombocytopenia ([place]) 02/14/2016    Past Surgical History:  Procedure Laterality Date  . ESOPHAGOGASTRODUODENOSCOPY (EGD) WITH PROPOFOL N/A  11/02/2016   Procedure: ESOPHAGOGASTRODUODENOSCOPY (EGD) WITH PROPOFOL;  Surgeon: Carol Ada, MD;  Location: [facility] END[facility]COPY;  Service: Endoscopy;  Laterality: N/A;  . HERNIA REPAIR    . LEG SURGERY          Home Medications    Prior to Admission medications   Medication Sig Start Date End Date Taking? Authorizing Provider  citalopram (CELEXA) 20 MG tablet Take 1 tablet (20 mg total) by mouth daily. For mood control 07/18/17   Money, Lowry Ram, FNP  gabapentin (NEURONTIN) 300 MG capsule Take 1 capsule (300 mg total) by mouth 3 (three) times daily. For withdrawal symptoms 07/18/17   Money, Lowry Ram, FNP  naltrexone (DEPADE) 50 MG tablet Take 1 tablet (50 mg total) by mouth daily. For cravings Patient not taking: Reported on 08/13/2017 07/19/17   Money, Lowry Ram, FNP  traZODone (DESYREL) 100 MG tablet Take 1 tablet (100 mg total) by mouth at bedtime. For insomnia 07/18/17   Money, Lowry Ram, FNP    Family History Family History  Problem Relation Age of Onset  . Diabetes Mother   . Alcoholism Mother   . Emphysema Father   . Lung cancer Father   . Alcoholism Father     Social History Social History   Tobacco Use  . Smoking status: Current Every Day Smoker    Packs/day: 1.00    Types: Cigarettes  . Smokeless tobacco: Never Used  Substance Use Topics  . Alcohol use: Yes    Comment: 4-5 40oz beers daily  . Drug use: Yes    Frequency: 3.0 times per week  Types: Cocaine    Comment: last use today 2 weeks ago (04-09-17)      Allergies   Tomato and Aspirin   Review of Systems Review of Systems  All other systems reviewed and are negative.    Physical Exam Updated Vital Signs BP 118/64 (BP Location: Left Arm)   Pulse 92   Temp 98.8 F (37.1 C) (Oral)   Resp 16   SpO2 99%   Physical Exam  Constitutional: He is oriented to person, place, and time. He appears well-developed and well-nourished. No distress.  HENT:  Head: Normocephalic and atraumatic.  Mouth/Throat:  Oropharynx is clear and moist.  Neck: Normal range of motion. Neck supple.  Cardiovascular: Normal rate and regular rhythm. Exam reveals no friction rub.  No murmur heard. Pulmonary/Chest: Effort normal and breath sounds normal. No respiratory distress. He has no wheezes. He has no rales.  Abdominal: Soft. Bowel sounds are normal. He exhibits no distension. There is no tenderness.  Musculoskeletal: Normal range of motion. He exhibits no edema.  Neurological: He is alert and oriented to person, place, and time. Coordination normal.  Skin: Skin is warm and dry. He is not diaphoretic.  Nursing note and vitals reviewed.    ED Treatments / Results  Labs (all labs ordered are listed, but only abnormal results are displayed) Labs Reviewed  COMPREHENSIVE METABOLIC PANEL - Abnormal; Notable for the following components:      Result Value   Glucose, Bld 107 (*)    Calcium 8.8 (*)    AST 120 (*)    All other components within normal limits  ETHANOL - Abnormal; Notable for the following components:   Alcohol, Ethyl (B) 371 (*)    All other components within normal limits  CBC - Abnormal; Notable for the following components:   Hemoglobin 9.9 (*)    HCT 32.2 (*)    MCV 75.1 (*)    MCH 23.1 (*)    RDW 19.7 (*)    Platelets 137 (*)    All other components within normal limits  RAPID URINE DRUG SCREEN, HOSP PERFORMED - Abnormal; Notable for the following components:   Cocaine POSITIVE (*)    All other components within normal limits    EKG None  Radiology No results found.  Procedures Procedures (including critical care time)  Medications Ordered in ED Medications - No data to display   Initial Impression / Assessment and Plan / ED Course  I have reviewed the triage vital signs and the nursing notes.  Pertinent labs & imaging results that were available during my care of the patient were reviewed by me and considered in my medical decision making (see chart for  details).  Patient with history of alcoholism, homelessness, and depression presenting with suicidal ideation and requesting help with alcohol.  His blood alcohol is elevated, but otherwise appears medically cleared.  TTS has been consulted and will determine the final disposition.  Final Clinical Impressions(s) / ED Diagnoses   Final diagnoses:  None    ED Discharge Orders    None       Veryl Speak, MD 08/24/17 2310

## 2017-08-24 NOTE — ED Notes (Signed)
TTS in process 

## 2017-08-24 NOTE — ED Provider Notes (Signed)
The patient has been seen and evaluated by the psychiatric service.  I did not get a call from them, I was told to discharge the patient by the nurse, I discussed this with the nurse and asked for psychiatry to be paged.  The social worker called me and told me that the patient had been seen by the nurse practitioner who did not call me, we have discussed the case the patient is not suicidal, at this time he wishes only for substance abuse treatment, this process has been started by the social worker who has contacted [facility] a, the patient will need to make the follow-up arrangements to get into that place.   Noemi Chapel, MD 08/24/17 [phone]
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
## 2017-08-24 NOTE — ED Notes (Signed)
Pt alert and compliant  VS documented Bedrails  Up X2 Breakfast tray ordered

## 2017-08-24 NOTE — Discharge Instructions (Signed)
Please see the list of treatment programs below, you will need to follow-up on your own, make your own phone calls and get into a substance abuse treatment program if that is what you wish.  You may return to the emergency department for any severe or worsening symptoms   Substance Abuse Treatment Programs  Intensive Outpatient Programs [facility]     [address], [place]       [facility] [address] [place], [place]  [facility]     (Inpatient and outpatient)     [address]           [place] [phone] (Suboxone and Methadone)  [place], [place] [address]      [place] [address] [place], [place]  [facility] (Outpatient/Inpatient, Chemical)    (insurance only) [phone]             [facility] ([place]) [place], [place]     [facility]     [address]     [place], [place]       [facility] (for caregivers and family) [phone] [address] Kristeen Mans. [place], [place]      Residential Treatment Programs [facility]      [address], [place], [place] [address]  [phone]       [facility] [address] [place], [place] [address] [phone]  [facility] [place]        [phone]       [facility] [phone]  [facility] ([place]             [place], [place] or [place] of [place], [address] [phone]  [facility] [place]    [address]      [place], [place]       [facility] [address] [place], [place]  [place]   [address] [place], [place] [address]     [phone]      Admissions: 8am-3pm M-F  [facility] (RTS) [address] [place], [place]  BATS Program: Residential Program [phone] Days)   [place], [place] or [phone]     ADATC: [facility] [place], [place] (Walk in Hours  over the weekend or by referral)  [facility] [place], [place], [place] [address] [phone]  Crisis Mobile: [facility]:  [phone] (for crisis response 24 hours a day) [facility] Hotline:      [phone] Outpatient Psychiatry and Counseling  [facility]: Mobile Crisis Management 24 hours:  [phone]  [facility] the [employer] sliding scale fee and walk in schedule: M-F 8am-12pm/1pm-3pm [place], [place] [address] [place], [place] [address] [phone]  [facility] (Formerly known as [facility] [employer])- new patient walk-in appointments available Monday - Friday 8am -3pm.          [address] [place], [place] [address] [phone] or crisis line- [facility]/ Intensive Outpatient Therapy Program [place], [place] [address] [place]      [phone] [address], [place] [address]                 [place]   [facility] [phone]. [address] [place], [place] [address]   [facility]          [address] Johnette Abraham  [place], [place] [address]       [phone]  [facility] [address] [place], [place]  [address] [phone]  [facility]    [address] [place], [place] [address]     [place], [place] Joycelyn Man     [place] [address]     [phone]       [facility] [place] [address]  [facility]     [address], [place], [place] [address], [place] [address] [place]     [address]     [place], [place] [address]     [phone]       [facility] [address] [place], [place] [address] [phone] Resources for Temporary Residential Assistance/Crisis [facility]) M-F 8am-3pm   [address], [place] [address]   [phone] Services include: laundry, barbering, support groups, case management, phone  & computer access, showers, AA/NA mtgs, mental health/substance abuse nurse, job skills class, disability information, VA assistance, spiritual classes, etc.   [facility]   [address] [place]     [place]              [employer] (women and children)       [address]. [place], [place] [address] [phone] [email or web address] for application and process Application Required  [facility]   [address]    [place]  [address]     [phone]  [place], [place] [address] [phone](DUKGURKY application appt.) Application Required  [facility] (women only)    [address]     [place], [place] [address]     [phone]      Intake starts 6pm daily Need valid ID, SSC, & Police report [employer] [address] [place], [place] [phone] Application Required  [employer] (men only)     [place].      [place], [place]       [place] (Pregnant women only) [address] [place], [place]  [facility]      [address]      [place], [place] [address]     [phone]             [facility] [address] [place], [place] 90 day commitment/SA/Application process  [facility](men only)     [address]     [place], [place]       Check-in at [facility] [address] [place], [place] [address] [phone] Men/Women/Women and Children must be there by 7 pm  [place], [place]

## 2017-08-24 NOTE — ED Notes (Signed)
Date and time results received: 08/24/17 12:19 AM (use smartphrase ".now" to insert current time)  Test: Ethanol Critical Value: 371  Name of Provider Notified: Dr. Stark Jock  Orders Received? Or Actions Taken?: no new orders

## 2017-08-27 ENCOUNTER — Emergency Department (HOSPITAL_COMMUNITY)
Admission: EM | Admit: 2017-08-27 | Discharge: 2017-08-27 | Disposition: A | Discharge status: Home / Self Care | Payer: Self-pay | Attending: Emergency Medicine | Admitting: Emergency Medicine

## 2017-08-27 ENCOUNTER — Other Ambulatory Visit: Payer: Self-pay

## 2017-08-27 ENCOUNTER — Encounter (HOSPITAL_COMMUNITY): Payer: Self-pay

## 2017-08-27 DIAGNOSIS — F17210 Nicotine dependence, cigarettes, uncomplicated: Secondary | ICD-10-CM | POA: Insufficient documentation

## 2017-08-27 DIAGNOSIS — I10 Essential (primary) hypertension: Secondary | ICD-10-CM | POA: Insufficient documentation

## 2017-08-27 DIAGNOSIS — R569 Unspecified convulsions: Secondary | ICD-10-CM | POA: Insufficient documentation

## 2017-08-27 LAB — BASIC METABOLIC PANEL
Anion gap: 11 (ref 5–15)
BUN: 7 mg/dL (ref 6–20)
CALCIUM: 8.3 mg/dL — AB (ref 8.9–10.3)
CHLORIDE: 103 mmol/L (ref 101–111)
CO2: 26 mmol/L (ref 22–32)
CREATININE: 0.74 mg/dL (ref 0.61–1.24)
GFR calc non Af Amer: >60 mL/min (ref >60)
GLUCOSE: 94 mg/dL (ref 65–99)
Potassium: 3.7 mmol/L (ref 3.5–5.1)
Sodium: 140 mmol/L (ref 135–145)

## 2017-08-27 LAB — CBC WITH DIFFERENTIAL/PLATELET
Basophils Absolute: 0 10*3/uL (ref 0.0–0.1)
Basophils Relative: 1 %
EOS ABS: 0.1 10*3/uL (ref 0.0–0.7)
Eosinophils Relative: 3 %
HEMATOCRIT: 30.7 % — AB (ref 39.0–52.0)
HEMOGLOBIN: 9.5 g/dL — AB (ref 13.0–17.0)
LYMPHS ABS: 1.1 10*3/uL (ref 0.7–4.0)
Lymphocytes Relative: 30 %
MCH: 23.6 pg — AB (ref 26.0–34.0)
MCHC: 30.9 g/dL (ref 30.0–36.0)
MCV: 76.4 fL — ABNORMAL LOW (ref 78.0–100.0)
MONO ABS: 0.7 10*3/uL (ref 0.1–1.0)
MONOS PCT: 17 %
NEUTROS ABS: 1.8 10*3/uL (ref 1.7–7.7)
Neutrophils Relative %: 49 %
Platelets: 106 10*3/uL — ABNORMAL LOW (ref 150–400)
RBC: 4.02 MIL/uL — ABNORMAL LOW (ref 4.22–5.81)
RDW: 19.3 % — AB (ref 11.5–15.5)
WBC: 3.7 10*3/uL — ABNORMAL LOW (ref 4.0–10.5)

## 2017-08-27 LAB — PHOSPHORUS: Phosphorus: 4 mg/dL (ref 2.5–4.6)

## 2017-08-27 LAB — CBG MONITORING, ED: Glucose-Capillary: 84 mg/dL (ref 65–99)

## 2017-08-27 LAB — MAGNESIUM: Magnesium: 2.2 mg/dL (ref 1.7–2.4)

## 2017-08-27 NOTE — ED Provider Notes (Addendum)
[facility] DEPT Provider Note   CSN: [id number] Arrival date & time: 08/27/17  1911     History   Chief Complaint Chief Complaint  Patient presents with  . Seizures    HPI Barry Moore is a 52 y.o. male.  HPI  52 year old comes in with chief complaint of seizures. Patient has history of alcoholic hepatitis, depression, alcohol abuse, hypertension, pancreatitis, and chronic suicidal behavior.  Patient states that he was drinking, when suddenly he had 6 episodes of seizures.  He denies any prodrome to his symptoms.  Patient denies any pain in his mouth or incontinence.  Patient denies any headaches, neck pain, recent infections, fevers, chills.  Patient admits to continued drinking, denies any other substance abuse.  Patient does not remember the last time he had a seizure.  He states that he has never been prescribed any seizure medications.  Otherwise patient states that he continues to have suicidal thoughts.  He does not have any active plan.  Past Medical History:  Diagnosis Date  . Alcoholic hepatitis   . Depression   . ETOH abuse   . Hypertension   . Pancreatitis   . Seizures ([place])   . Suicidal behavior     Patient Active Problem List   Diagnosis Date Noted  . Substance induced mood disorder ([place]) 07/16/2017  . Severe recurrent major depression without psychotic features ([place]) 05/15/2017  . Anemia 03/18/2017  . Acute blood loss anemia 02/21/2017  . UGI bleed 02/08/2017  . Alcohol withdrawal ([place]) 02/08/2017  . Cocaine abuse ([place]) 11/18/2016  . GI bleed 11/01/2016  . Alcohol abuse   . Major depressive disorder, recurrent severe without psychotic features ([place]) 07/06/2016  . 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 Seizure ([place]) 05/26/2016  . Tobacco abuse 05/26/2016  . Homeless 05/26/2016  . Essential hypertension 05/26/2016  . Alcohol dependence with alcohol-induced mood disorder ([place]) 02/14/2016  . Severe alcohol  withdrawal without perceptual disturbances without complication ([place]) 28/41/3244  . Alcoholic hepatitis without ascites 02/14/2016  . Thrombocytopenia ([place]) 02/14/2016    Past Surgical History:  Procedure Laterality Date  . ESOPHAGOGASTRODUODENOSCOPY (EGD) WITH PROPOFOL N/A 11/02/2016   Procedure: ESOPHAGOGASTRODUODENOSCOPY (EGD) WITH PROPOFOL;  Surgeon: Carol Ada, MD;  [facility];  Service: Endoscopy;  Laterality: N/A;  . HERNIA REPAIR    . LEG SURGERY          Home Medications    Prior to Admission medications   Medication Sig Start Date End Date Taking? Authorizing Provider  acetaminophen (TYLENOL) 500 MG tablet Take 500 mg by mouth every 6 (six) hours as needed for mild pain.    [provider]  citalopram (CELEXA) 20 MG tablet Take 1 tablet (20 mg total) by mouth daily. For mood control Patient not taking: Reported on 08/27/2017 07/18/17   Money, Lowry Ram, FNP  gabapentin (NEURONTIN) 300 MG capsule Take 1 capsule (300 mg total) by mouth 3 (three) times daily. For withdrawal symptoms Patient not taking: Reported on 08/27/2017 07/18/17   Money, Lowry Ram, FNP  naltrexone (DEPADE) 50 MG tablet Take 1 tablet (50 mg total) by mouth daily. For cravings Patient not taking: Reported on 08/13/2017 07/19/17   Money, Lowry Ram, FNP  traZODone (DESYREL) 100 MG tablet Take 1 tablet (100 mg total) by mouth at bedtime. For insomnia Patient not taking: Reported on 08/27/2017 07/18/17   Money, Lowry Ram, FNP    Family History Family History  Problem Relation Age of Onset  . Diabetes Mother   .  Alcoholism Mother   . Emphysema Father   . Lung cancer Father   . Alcoholism Father     Social History Social History   Tobacco Use  . Smoking status: Current Every Day Smoker    Packs/day: 1.00    Types: Cigarettes  . Smokeless tobacco: Never Used  Substance Use Topics  . Alcohol use: Yes    Comment: 4-5 40oz beers daily; BAC .371  . Drug use: Yes    Frequency: 3.0 times per  week    Types: Cocaine    Comment: +Coc     Allergies   Tomato and Aspirin   Review of Systems Review of Systems  Constitutional: Positive for activity change. Negative for chills and fever.  Eyes: Negative for visual disturbance.  Respiratory: Negative for cough, chest tightness and shortness of breath.   Cardiovascular: Negative for chest pain.  Gastrointestinal: Negative for abdominal distention, nausea and vomiting.  Genitourinary: Negative for difficulty urinating, dysuria and enuresis.  Musculoskeletal: Negative for arthralgias and neck pain.  Allergic/Immunologic: Negative for immunocompromised state.  Neurological: Positive for dizziness and seizures. Negative for light-headedness and headaches.  Hematological: Does not bruise/bleed easily.  Psychiatric/Behavioral: Negative for confusion.  All other systems reviewed and are negative.    Physical Exam Updated Vital Signs BP 138/88   Pulse 90   Temp 98.4 F (36.9 C) (Oral)   Resp (!) 26   Ht 5\' 8"  (1.727 m)   Wt 79.4 kg (175 lb)   SpO2 97%   BMI 26.61 kg/m   Physical Exam  Constitutional: He is oriented to person, place, and time. He appears well-developed.  HENT:  Head: Atraumatic.  Eyes: Pupils are equal, round, and reactive to light.  Saccadic eye-movement horizontally with lateral gaze and medial gaze  Neck: Neck supple.  Cardiovascular: Normal rate.  Pulmonary/Chest: Effort normal.  Neurological: He is alert and oriented to person, place, and time. No cranial nerve deficit. Coordination normal.  Cerebellar exam is normal (finger to nose) Sensory exam normal for bilateral upper and lower extremities - and patient is able to discriminate between sharp and dull. Motor exam is 4+/5   Skin: Skin is warm.  Nursing note and vitals reviewed.    ED Treatments / Results  Labs (all labs ordered are listed, but only abnormal results are displayed) Labs Reviewed  BASIC METABOLIC PANEL - Abnormal; Notable  for the following components:      Result Value   Calcium 8.3 (*)    All other components within normal limits  CBC WITH DIFFERENTIAL/PLATELET - Abnormal; Notable for the following components:   WBC 3.7 (*)    RBC 4.02 (*)    Hemoglobin 9.5 (*)    HCT 30.7 (*)    MCV 76.4 (*)    MCH 23.6 (*)    RDW 19.3 (*)    All other components within normal limits  MAGNESIUM  PHOSPHORUS  CBG MONITORING, ED    EKG None  Radiology No results found.  Procedures Procedures (including critical care time)  Medications Ordered in ED Medications - No data to display   Initial Impression / Assessment and Plan / ED Course  I have reviewed the triage vital signs and the nursing notes.  Pertinent labs & imaging results that were available during my care of the patient were reviewed by me and considered in my medical decision making (see chart for details).  Clinical Course as of Aug 27 2301  Sat Aug 27, 2017  2302 Patient  had no seizure-like activity here.  We have watched him for close to 4 hours now, and we will discharge him.    [AN]  2302 Strict ER return precautions have been discussed, and patient is agreeing with the plan and is comfortable with the workup done and the recommendations from the ER.    [AN]    Clinical Course User Index [AN] Varney Biles, MD    52 year old comes in with chief complaint of seizures. Patient has history of alcoholism and allegedly he has had seizures in the past, but he has not been put on any seizure medications.  He states that he had 6 episodes of seizure -with seems rather odd.  He describes his seizure-like activity as generalized shaking, but in addition to remembering that he had 6 seizures, he also denies any incontinence or tongue biting.  We will monitor patient in the ED for 4 hours, to see if there is any episodes here. Basic labs have been ordered along with seizure precautions. Patient does not have any symptoms or signs of alcohol  withdrawal, therefore I do not think the seizure was related to delirium tremens.  Final Clinical Impressions(s) / ED Diagnoses   Final diagnoses:  Seizure-like activity [facility])    ED Discharge Orders    None       Varney Biles, MD 08/27/17 7619    Varney Biles, MD 08/27/17 2303

## 2017-08-27 NOTE — ED Notes (Signed)
Signature box not working 

## 2017-08-27 NOTE — Discharge Instructions (Addendum)
We saw you in the ER for seizure-like activity. All the results in the ER are normal, labs and imaging. We are not sure what is causing your symptoms. The workup in the ER is not complete, and is limited to screening for life threatening and emergent conditions only, so please see a primary care doctor for further evaluation.

## 2017-08-27 NOTE — ED Triage Notes (Signed)
Arrived via EMS for seizure, has hx of same. Also has hiccups and has ETOH on board.

## 2017-08-27 NOTE — ED Notes (Signed)
Bed: WV14 Expected date:  Expected time:  Means of arrival:  Comments: 52 yo seizures

## 2017-08-30 ENCOUNTER — Encounter (HOSPITAL_COMMUNITY): Payer: Self-pay

## 2017-08-30 ENCOUNTER — Other Ambulatory Visit: Payer: Self-pay

## 2017-08-30 ENCOUNTER — Emergency Department (HOSPITAL_COMMUNITY)
Admission: EM | Admit: 2017-08-30 | Discharge: 2017-08-31 | Disposition: A | Discharge status: Home / Self Care | Payer: Self-pay | Attending: Emergency Medicine | Admitting: Emergency Medicine

## 2017-08-30 DIAGNOSIS — F17210 Nicotine dependence, cigarettes, uncomplicated: Secondary | ICD-10-CM | POA: Insufficient documentation

## 2017-08-30 DIAGNOSIS — F10120 Alcohol abuse with intoxication, uncomplicated: Secondary | ICD-10-CM | POA: Insufficient documentation

## 2017-08-30 DIAGNOSIS — F329 Major depressive disorder, single episode, unspecified: Secondary | ICD-10-CM | POA: Insufficient documentation

## 2017-08-30 DIAGNOSIS — I10 Essential (primary) hypertension: Secondary | ICD-10-CM | POA: Insufficient documentation

## 2017-08-30 DIAGNOSIS — R569 Unspecified convulsions: Secondary | ICD-10-CM

## 2017-08-30 DIAGNOSIS — F10920 Alcohol use, unspecified with intoxication, uncomplicated: Secondary | ICD-10-CM

## 2017-08-30 DIAGNOSIS — Z590 Homelessness: Secondary | ICD-10-CM | POA: Insufficient documentation
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
## 2017-08-30 NOTE — ED Notes (Signed)
Bed: WHALE Expected date:  Expected time:  Means of arrival:  Comments: 

## 2017-08-30 NOTE — ED Notes (Signed)
Spoke with EDP about patient. No new orders at this time. Continue observation.

## 2017-08-31 LAB — COMPREHENSIVE METABOLIC PANEL
ALK PHOS: 122 U/L (ref 38–126)
ALT: 56 U/L (ref 17–63)
ANION GAP: 11 (ref 5–15)
AST: 144 U/L — ABNORMAL HIGH (ref 15–41)
Albumin: 4 g/dL (ref 3.5–5.0)
BILIRUBIN TOTAL: 0.4 mg/dL (ref 0.3–1.2)
BUN: 8 mg/dL (ref 6–20)
CO2: 23 mmol/L (ref 22–32)
Calcium: 8.5 mg/dL — ABNORMAL LOW (ref 8.9–10.3)
Chloride: 108 mmol/L (ref 101–111)
Creatinine, Ser: 0.81 mg/dL (ref 0.61–1.24)
Glucose, Bld: 89 mg/dL (ref 65–99)
POTASSIUM: 3.7 mmol/L (ref 3.5–5.1)
Sodium: 142 mmol/L (ref 135–145)
TOTAL PROTEIN: 7.7 g/dL (ref 6.5–8.1)

## 2017-08-31 LAB — RAPID URINE DRUG SCREEN, HOSP PERFORMED
Amphetamines: NOT DETECTED
BENZODIAZEPINES: POSITIVE — AB
Barbiturates: NOT DETECTED
COCAINE: NOT DETECTED
OPIATES: NOT DETECTED
TETRAHYDROCANNABINOL: NOT DETECTED

## 2017-08-31 LAB — CBC WITH DIFFERENTIAL/PLATELET
BASOS ABS: 0.1 10*3/uL (ref 0.0–0.1)
BASOS PCT: 2 %
Eosinophils Absolute: 0.2 10*3/uL (ref 0.0–0.7)
Eosinophils Relative: 6 %
HEMATOCRIT: 32.2 % — AB (ref 39.0–52.0)
HEMOGLOBIN: 9.7 g/dL — AB (ref 13.0–17.0)
Lymphocytes Relative: 42 %
Lymphs Abs: 1.5 10*3/uL (ref 0.7–4.0)
MCH: 22.9 pg — ABNORMAL LOW (ref 26.0–34.0)
MCHC: 30.1 g/dL (ref 30.0–36.0)
MCV: 75.9 fL — ABNORMAL LOW (ref 78.0–100.0)
MONO ABS: 0.6 10*3/uL (ref 0.1–1.0)
Monocytes Relative: 17 %
NEUTROS ABS: 1.2 10*3/uL — AB (ref 1.7–7.7)
NEUTROS PCT: 33 %
Platelets: 118 10*3/uL — ABNORMAL LOW (ref 150–400)
RBC: 4.24 MIL/uL (ref 4.22–5.81)
RDW: 19.4 % — AB (ref 11.5–15.5)
WBC: 3.5 10*3/uL — AB (ref 4.0–10.5)

## 2017-08-31 LAB — ETHANOL: Alcohol, Ethyl (B): 355 mg/dL (ref <10)

## 2017-08-31 NOTE — ED Notes (Signed)
Pt ambulated in hallway and stated he was dizzy upon walking. MD notified. Pt given gingerale when back in room.

## 2017-08-31 NOTE — ED Notes (Signed)
Date and time results received: 08/31/17 12:29 AM  (use smartphrase ".now" to insert current time)  Test: ETOH Critical Value: 355  Name of Provider Notified: Tomi Bamberger  Orders Received? Or Actions Taken?: no new orders at this time

## 2017-08-31 NOTE — Discharge Instructions (Addendum)
Please follow up with Barry Moore. You need help with your alcohol problem.

## 2017-08-31 NOTE — ED Notes (Signed)
Patient provided with a sandwich and water

## 2017-08-31 NOTE — ED Provider Notes (Signed)
[facility] DEPT Provider Note   CSN: [id number] Arrival date & time: 08/30/17  2053  Time seen 23:15 PM    History   Chief Complaint Chief Complaint  Patient presents with  . Seizures   Level 5 caveat for altered mental status  HPI Barry Moore is a 52 y.o. male.  HPI patient presents to the emergency department per EMS.  EMS states they witnessed 3 grand mal seizures and gave the patient Versed 5 mg IM after which patient had no further seizure activity.  He however arrives to the ED unresponsive.  Patient is a frequent ED visitor for alcohol abuse and seizure disorder.  PCP Placey, Audrea Muscat, NP   Past Medical History:  Diagnosis Date  . Alcoholic hepatitis   . Depression   . ETOH abuse   . Hypertension   . Pancreatitis   . Seizures ([place])   . Suicidal behavior     Patient Active Problem List   Diagnosis Date Noted  . Substance induced mood disorder ([place]) 07/16/2017  . Severe recurrent major depression without psychotic features ([place]) 05/15/2017  . Anemia 03/18/2017  . Acute blood loss anemia 02/21/2017  . UGI bleed 02/08/2017  . Alcohol withdrawal ([place]) 02/08/2017  . Cocaine abuse ([place]) 11/18/2016  . GI bleed 11/01/2016  . Alcohol abuse   . Major depressive disorder, recurrent severe without psychotic features ([place]) 07/06/2016  . 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 Seizure ([place]) 05/26/2016  . Tobacco abuse 05/26/2016  . Homeless 05/26/2016  . Essential hypertension 05/26/2016  . Alcohol dependence with alcohol-induced mood disorder ([place]) 02/14/2016  . Severe alcohol withdrawal without perceptual disturbances without complication ([place]) 67/61/9509  . Alcoholic hepatitis without ascites 02/14/2016  . Thrombocytopenia ([place]) 02/14/2016    Past Surgical History:  Procedure Laterality Date  . ESOPHAGOGASTRODUODENOSCOPY (EGD) WITH PROPOFOL N/A 11/02/2016   Procedure: ESOPHAGOGASTRODUODENOSCOPY (EGD) WITH PROPOFOL;   Surgeon: Carol Ada, MD;  [facility];  Service: Endoscopy;  Laterality: N/A;  . HERNIA REPAIR    . LEG SURGERY          Home Medications    Prior to Admission medications   Medication Sig Start Date End Date Taking? Authorizing Provider  citalopram (CELEXA) 20 MG tablet Take 1 tablet (20 mg total) by mouth daily. For mood control Patient not taking: Reported on 08/27/2017 07/18/17   Money, Lowry Ram, FNP  gabapentin (NEURONTIN) 300 MG capsule Take 1 capsule (300 mg total) by mouth 3 (three) times daily. For withdrawal symptoms Patient not taking: Reported on 08/27/2017 07/18/17   Money, Lowry Ram, FNP  naltrexone (DEPADE) 50 MG tablet Take 1 tablet (50 mg total) by mouth daily. For cravings Patient not taking: Reported on 08/13/2017 07/19/17   Money, Lowry Ram, FNP  traZODone (DESYREL) 100 MG tablet Take 1 tablet (100 mg total) by mouth at bedtime. For insomnia Patient not taking: Reported on 08/27/2017 07/18/17   Money, Lowry Ram, FNP    Family History Family History  Problem Relation Age of Onset  . Diabetes Mother   . Alcoholism Mother   . Emphysema Father   . Lung cancer Father   . Alcoholism Father     Social History Social History   Tobacco Use  . Smoking status: Current Every Day Smoker    Packs/day: 1.00    Types: Cigarettes  . Smokeless tobacco: Never Used  Substance Use Topics  . Alcohol use: Yes    Comment: 4-5 40oz beers daily; BAC .371  .  Drug use: Yes    Frequency: 3.0 times per week    Types: Cocaine    Comment: +Coc  Homeless   Allergies   Tomato and Aspirin   Review of Systems Review of Systems  Unable to perform ROS: Mental status change     Physical Exam Updated Vital Signs BP 120/82   Pulse 91   Temp 98.4 F (36.9 C) (Oral)   Resp (!) 31   SpO2 100%   Vital signs normal    Physical Exam  Constitutional: He appears well-developed and well-nourished.  HENT:  Head: Normocephalic and atraumatic.  Right Ear: External ear  normal.  Left Ear: External ear normal.  Nose: Nose normal.  Eyes: Pupils are equal, round, and reactive to light. Conjunctivae and EOM are normal.  Cardiovascular: Normal rate, regular rhythm and normal heart sounds.  Pulmonary/Chest: Effort normal and breath sounds normal. No respiratory distress.  Abdominal: Soft. There is no tenderness.  Musculoskeletal: He exhibits no deformity.  Neurological:  Unable to assess  Skin: Skin is warm and dry.  Psychiatric: He is noncommunicative.  Nursing note and vitals reviewed.    ED Treatments / Results  Labs (all labs ordered are listed, but only abnormal results are displayed) Results for orders placed or performed during the hospital encounter of 08/30/17  Comprehensive metabolic panel  Result Value Ref Range   Sodium 142 135 - 145 mmol/L   Potassium 3.7 3.5 - 5.1 mmol/L   Chloride 108 101 - 111 mmol/L   CO2 23 22 - 32 mmol/L   Glucose, Bld 89 65 - 99 mg/dL   BUN 8 6 - 20 mg/dL   Creatinine, Ser 0.81 0.61 - 1.24 mg/dL   Calcium 8.5 (L) 8.9 - 10.3 mg/dL   Total Protein 7.7 6.5 - 8.1 g/dL   Albumin 4.0 3.5 - 5.0 g/dL   AST 144 (H) 15 - 41 U/L   ALT 56 17 - 63 U/L   Alkaline Phosphatase 122 38 - 126 U/L   Total Bilirubin 0.4 0.3 - 1.2 mg/dL   GFR calc non Af Amer >60 >60 mL/min   GFR calc Af Amer >60 >60 mL/min   Anion gap 11 5 - 15  CBC with Differential  Result Value Ref Range   WBC 3.5 (L) 4.0 - 10.5 K/uL   RBC 4.24 4.22 - 5.81 MIL/uL   Hemoglobin 9.7 (L) 13.0 - 17.0 g/dL   HCT 32.2 (L) 39.0 - 52.0 %   MCV 75.9 (L) 78.0 - 100.0 fL   MCH 22.9 (L) 26.0 - 34.0 pg   MCHC 30.1 30.0 - 36.0 g/dL   RDW 19.4 (H) 11.5 - 15.5 %   Platelets 118 (L) 150 - 400 K/uL   Neutrophils Relative % 33 %   Neutro Abs 1.2 (L) 1.7 - 7.7 K/uL   Lymphocytes Relative 42 %   Lymphs Abs 1.5 0.7 - 4.0 K/uL   Monocytes Relative 17 %   Monocytes Absolute 0.6 0.1 - 1.0 K/uL   Eosinophils Relative 6 %   Eosinophils Absolute 0.2 0.0 - 0.7 K/uL    Basophils Relative 2 %   Basophils Absolute 0.1 0.0 - 0.1 K/uL  Ethanol  Result Value Ref Range   Alcohol, Ethyl (B) 355 (HH) <10 mg/dL  Urine rapid drug screen (hosp performed)  Result Value Ref Range   Opiates NONE DETECTED NONE DETECTED   Cocaine NONE DETECTED NONE DETECTED   Benzodiazepines POSITIVE (A) NONE DETECTED   Amphetamines NONE DETECTED  NONE DETECTED   Tetrahydrocannabinol NONE DETECTED NONE DETECTED   Barbiturates NONE DETECTED NONE DETECTED   Laboratory interpretation all normal except stable anemia, alcohol intoxication, positive UDS for benzos    EKG None  Radiology No results found.  Procedures Procedures (including critical care time)  Medications Ordered in ED Medications - No data to display   Initial Impression / Assessment and Plan / ED Course  I have reviewed the triage vital signs and the nursing notes.  Pertinent labs & imaging results that were available during my care of the patient were reviewed by me and considered in my medical decision making (see chart for details).     Recheck at 12 midnight patient is now awake and talking.  He states he has had a seizure disorder for about 4 years.  He states he can have them while drinking or when he quits drinking.  He states he has never been prescribed medication for his seizures.  Patient is hungry and is eating.  4:10 AM patient was seen ambulatory to the bathroom.  When I asked him how he felt he states he feels a little dizzy.  He was given fluids to drink.  Hopefully patient will be able be discharged home soon.  06:30 Pt discharged, he hasn't had any more seizures since EMS brought him to the ED>   Final Clinical Impressions(s) / ED Diagnoses   Final diagnoses:  Seizure-like activity ([place])  Alcoholic intoxication without complication [facility])    ED Discharge Orders    None      Plan discharge  Rolland Porter, MD, Barbette Or, MD 08/31/17 [phone]

## 2017-08-31 NOTE — ED Notes (Signed)
Pt aware that a urine sample is needed. Urinal at bedside.

## 2017-09-01 ENCOUNTER — Emergency Department (HOSPITAL_COMMUNITY)
Admission: EM | Admit: 2017-09-01 | Discharge: 2017-09-02 | Disposition: A | Discharge status: Home / Self Care | Payer: Self-pay | Attending: Emergency Medicine | Admitting: Emergency Medicine

## 2017-09-01 DIAGNOSIS — I10 Essential (primary) hypertension: Secondary | ICD-10-CM | POA: Insufficient documentation

## 2017-09-01 DIAGNOSIS — F10229 Alcohol dependence with intoxication, unspecified: Secondary | ICD-10-CM | POA: Insufficient documentation

## 2017-09-01 DIAGNOSIS — F329 Major depressive disorder, single episode, unspecified: Secondary | ICD-10-CM | POA: Insufficient documentation

## 2017-09-01 DIAGNOSIS — F17210 Nicotine dependence, cigarettes, uncomplicated: Secondary | ICD-10-CM | POA: Insufficient documentation

## 2017-09-01 DIAGNOSIS — F1024 Alcohol dependence with alcohol-induced mood disorder: Secondary | ICD-10-CM | POA: Diagnosis present

## 2017-09-01 DIAGNOSIS — F10920 Alcohol use, unspecified with intoxication, uncomplicated: Secondary | ICD-10-CM

## 2017-09-01 DIAGNOSIS — R45851 Suicidal ideations: Secondary | ICD-10-CM | POA: Insufficient documentation

## 2017-09-01 LAB — BASIC METABOLIC PANEL
Anion gap: 11 (ref 5–15)
BUN: 5 mg/dL — AB (ref 6–20)
CHLORIDE: 104 mmol/L (ref 101–111)
CO2: 24 mmol/L (ref 22–32)
Calcium: 8.7 mg/dL — ABNORMAL LOW (ref 8.9–10.3)
Creatinine, Ser: 0.59 mg/dL — ABNORMAL LOW (ref 0.61–1.24)
GFR calc Af Amer: >60 mL/min (ref >60)
GFR calc non Af Amer: >60 mL/min (ref >60)
Glucose, Bld: 88 mg/dL (ref 65–99)
POTASSIUM: 3.5 mmol/L (ref 3.5–5.1)
SODIUM: 139 mmol/L (ref 135–145)

## 2017-09-01 LAB — RAPID URINE DRUG SCREEN, HOSP PERFORMED
AMPHETAMINES: NOT DETECTED
Benzodiazepines: NOT DETECTED
COCAINE: NOT DETECTED
OPIATES: NOT DETECTED
TETRAHYDROCANNABINOL: NOT DETECTED

## 2017-09-01 LAB — CBC WITH DIFFERENTIAL/PLATELET
Basophils Absolute: 0.1 10*3/uL (ref 0.0–0.1)
Basophils Relative: 2 %
EOS PCT: 3 %
Eosinophils Absolute: 0.2 10*3/uL (ref 0.0–0.7)
HCT: 33.7 % — ABNORMAL LOW (ref 39.0–52.0)
HEMOGLOBIN: 10.5 g/dL — AB (ref 13.0–17.0)
LYMPHS ABS: 1.5 10*3/uL (ref 0.7–4.0)
LYMPHS PCT: 32 %
MCH: 23.5 pg — AB (ref 26.0–34.0)
MCHC: 31.2 g/dL (ref 30.0–36.0)
MCV: 75.4 fL — AB (ref 78.0–100.0)
MONOS PCT: 17 %
Monocytes Absolute: 0.8 10*3/uL (ref 0.1–1.0)
Neutro Abs: 2.3 10*3/uL (ref 1.7–7.7)
Neutrophils Relative %: 46 %
Platelets: 132 10*3/uL — ABNORMAL LOW (ref 150–400)
RBC: 4.47 MIL/uL (ref 4.22–5.81)
RDW: 19.2 % — ABNORMAL HIGH (ref 11.5–15.5)
WBC: 4.8 10*3/uL (ref 4.0–10.5)

## 2017-09-01 LAB — SALICYLATE LEVEL: Salicylate Lvl: <7 mg/dL (ref 2.8–30.0)

## 2017-09-01 LAB — ETHANOL: Alcohol, Ethyl (B): 408 mg/dL (ref <10)

## 2017-09-01 LAB — ACETAMINOPHEN LEVEL: Acetaminophen (Tylenol), Serum: <10 ug/mL — ABNORMAL LOW (ref 10–30)

## 2017-09-01 LAB — CBG MONITORING, ED: Glucose-Capillary: 94 mg/dL (ref 65–99)

## 2017-09-01 MED ORDER — LORAZEPAM 2 MG/ML IJ SOLN: 0.0000 mg | Freq: Two times a day (BID) | INTRAMUSCULAR | Status: DC

## 2017-09-01 MED ORDER — LORAZEPAM 1 MG PO TABS
0.0000 mg | ORAL_TABLET | Freq: Four times a day (QID) | ORAL | Status: DC
  Administered 2017-09-02: 1 mg via ORAL
  Filled 2017-09-01: qty 1

## 2017-09-01 MED ORDER — LORAZEPAM 2 MG/ML IJ SOLN: 0.0000 mg | Freq: Four times a day (QID) | INTRAMUSCULAR | Status: DC

## 2017-09-01 MED ORDER — VITAMIN B-1 100 MG PO TABS
100.0000 mg | ORAL_TABLET | Freq: Every day | ORAL | Status: DC
  Administered 2017-09-02: 100 mg via ORAL
  Filled 2017-09-01: qty 1

## 2017-09-01 MED ORDER — LORAZEPAM 1 MG PO TABS: 0.0000 mg | ORAL_TABLET | Freq: Two times a day (BID) | ORAL | Status: DC

## 2017-09-01 MED ORDER — THIAMINE HCL 100 MG/ML IJ SOLN
100.0000 mg | Freq: Every day | INTRAMUSCULAR | Status: DC
  Administered 2017-09-01: 100 mg via INTRAVENOUS
  Filled 2017-09-01: qty 2

## 2017-09-01 NOTE — ED Triage Notes (Signed)
Transported by [facility]--homeless--friend called EMS stating that the patient had 5 "witnessed" seizures today. ETOH on board, EMS denies any post-ictal phase or witnessed seizure activity. VSS with EMS.

## 2017-09-01 NOTE — ED Notes (Signed)
Two bags of personal belongings placed in the [place] [facility] cabinets behind at the nurses station. Patient placed in purple scrubs.

## 2017-09-01 NOTE — ED Notes (Signed)
Patient report suicidal ideations with no plan. Patient denies homicidal ideations, denies visual / auditory hallucinations. Plan of care discussed. Encouragement and support provided and safety maintain. Q 15 min safety checks in place.
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
## 2017-09-01 NOTE — ED Provider Notes (Signed)
[place] DEPT Provider Note   CSN: [id number] Arrival date & time: 09/01/17  1515     History   Chief Complaint Chief Complaint  Patient presents with  . Seizures    HPI Barry Moore is a 52 y.o. male.  52 year old male with prior history significant for alcohol abuse, reported seizure disorder, suicidality, and polysubstance abuse presents for evaluation.  Patient reportedly told EMS that he had had "5 seizures" prior to their arrival.  No witnessed seizure activity per EMS.  Patient appears to be intoxicated upon initial evaluation.  Of note, patient has had multiple prior evaluations at this facility for very similar complaints.  During my initial evaluation the patient reported thoughts of self-harm.  He has a plan to harm himself with a knife -- which he no longer has.  He denies recent overdose.  Patient does not take any medications to control his reported seizure disorder.  He reports that he has had at least 2 to 3 40 ounce drinks already today.   The history is provided by the patient and medical records.  Seizures   This is a recurrent problem. The current episode started 1 to 2 hours ago. The problem has been resolved. There were 4 to 5 seizures. The episode was not witnessed. The seizures did not continue in the ED.    Past Medical History:  Diagnosis Date  . Alcoholic hepatitis   . Depression   . ETOH abuse   . Hypertension   . Pancreatitis   . Seizures ([place])   . Suicidal behavior     Patient Active Problem List   Diagnosis Date Noted  . Substance induced mood disorder ([place]) 07/16/2017  . Severe recurrent major depression without psychotic features ([place]) 05/15/2017  . Anemia 03/18/2017  . Acute blood loss anemia 02/21/2017  . UGI bleed 02/08/2017  . Alcohol withdrawal ([place]) 02/08/2017  . Cocaine abuse ([place]) 11/18/2016  . GI bleed 11/01/2016  . Alcohol abuse   . Major depressive disorder, recurrent severe without  psychotic features ([place]) 07/06/2016  . Alcohol-induced mood disorder ([place]) 06/15/2016  . Seizure ([place]) 05/26/2016  . Tobacco abuse 05/26/2016  . Homeless 05/26/2016  . Essential hypertension 05/26/2016  . Alcohol dependence with alcohol-induced mood disorder ([place]) 02/14/2016  . Severe alcohol withdrawal without perceptual disturbances without complication ([place]) 57/26/2035  . Alcoholic hepatitis without ascites 02/14/2016  . Thrombocytopenia ([place]) 02/14/2016    Past Surgical History:  Procedure Laterality Date  . ESOPHAGOGASTRODUODENOSCOPY (EGD) WITH PROPOFOL N/A 11/02/2016   Procedure: ESOPHAGOGASTRODUODENOSCOPY (EGD) WITH PROPOFOL;  Surgeon: Carol Ada, MD;  [facility];  Service: Endoscopy;  Laterality: N/A;  . HERNIA REPAIR    . LEG SURGERY          Home Medications    Prior to Admission medications   Medication Sig Start Date End Date Taking? Authorizing Provider  citalopram (CELEXA) 20 MG tablet Take 1 tablet (20 mg total) by mouth daily. For mood control Patient not taking: Reported on 08/27/2017 07/18/17   Money, Lowry Ram, FNP  gabapentin (NEURONTIN) 300 MG capsule Take 1 capsule (300 mg total) by mouth 3 (three) times daily. For withdrawal symptoms Patient not taking: Reported on 08/27/2017 07/18/17   Money, Lowry Ram, FNP  naltrexone (DEPADE) 50 MG tablet Take 1 tablet (50 mg total) by mouth daily. For cravings Patient not taking: Reported on 08/13/2017 07/19/17   Money, Lowry Ram, FNP  traZODone (DESYREL) 100 MG tablet Take 1 tablet (100 mg total)  by mouth at bedtime. For insomnia Patient not taking: Reported on 08/27/2017 07/18/17   Money, Lowry Ram, FNP    Family History Family History  Problem Relation Age of Onset  . Diabetes Mother   . Alcoholism Mother   . Emphysema Father   . Lung cancer Father   . Alcoholism Father     Social History Social History   Tobacco Use  . Smoking status: Current Every Day Smoker    Packs/day: 1.00    Types: Cigarettes    . Smokeless tobacco: Never Used  Substance Use Topics  . Alcohol use: Yes    Comment: 4-5 40oz beers daily; BAC .371  . Drug use: Yes    Frequency: 3.0 times per week    Types: Cocaine    Comment: +Coc     Allergies   Tomato and Aspirin   Review of Systems Review of Systems  Neurological: Positive for seizures.  Psychiatric/Behavioral: Positive for suicidal ideas.  All other systems reviewed and are negative.    Physical Exam Updated Vital Signs BP (!) 151/103   Pulse 87   Temp 98.6 F (37 C) (Oral)   Resp 18   SpO2 97%   Physical Exam  Constitutional: He is oriented to person, place, and time. He appears well-developed and well-nourished. No distress.  No acute distress  disheveled  smells of alcohol     HENT:  Head: Normocephalic and atraumatic.  Mouth/Throat: Oropharynx is clear and moist.  Eyes: Pupils are equal, round, and reactive to light. Conjunctivae and EOM are normal.  Neck: Normal range of motion. Neck supple.  Cardiovascular: Normal rate, regular rhythm and normal heart sounds.  Pulmonary/Chest: Effort normal and breath sounds normal. No respiratory distress.  Abdominal: Soft. He exhibits no distension. There is no tenderness.  Musculoskeletal: Normal range of motion. He exhibits no edema or deformity.  Neurological: He is alert and oriented to person, place, and time.  Skin: Skin is warm and dry.  Psychiatric: He has a normal mood and affect.  Nursing note and vitals reviewed.    ED Treatments / Results  Labs (all labs ordered are listed, but only abnormal results are displayed) Labs Reviewed  BASIC METABOLIC PANEL  CBC WITH DIFFERENTIAL/PLATELET  ETHANOL  ACETAMINOPHEN LEVEL  SALICYLATE LEVEL  RAPID URINE DRUG SCREEN, HOSP PERFORMED  CBG MONITORING, ED    EKG None  Radiology No results found.  Procedures Procedures (including critical care time)  Medications Ordered in ED Medications - No data to display   Initial  Impression / Assessment and Plan / ED Course  I have reviewed the triage vital signs and the nursing notes.  Pertinent labs & imaging results that were available during my care of the patient were reviewed by me and considered in my medical decision making (see chart for details).     MDM  Screen complete  Patient is presenting for evaluation of possible seizure. Upon arrival, he appears intoxicated (confirmed by labs). He also reports SI with a plan (to cut and bleed to death with a knife).    Additional screening labs are not suggestive of other acute pathology.    Patient will require a TTS/Psych evaluation once sober. He will be placed on psych hold with CIWA.    At this time he is medically clear for further psychiatric assessment.     Final Clinical Impressions(s) / ED Diagnoses   Final diagnoses:  Suicidal ideation  Alcoholic intoxication without complication [facility])    ED  Discharge Orders    None       Valarie Merino, MD 09/01/17 1801

## 2017-09-01 NOTE — BHH Counselor (Signed)
Disposition:   Patient has a BAL of 403. Patient present with passive suicidal ideations. Per Dr. Mariea Clonts patient is observation for safety.

## 2017-09-01 NOTE — ED Notes (Signed)
ED Provider at bedside. 

## 2017-09-01 NOTE — ED Notes (Signed)
CRITICAL VALUE STICKER  CRITICAL VALUE: ETOH 408  RECEIVER (on-site recipient of call): Jake T RN  DATE & TIME NOTIFIED: 09/01/17 457p  MESSENGER (representative from lab): Nunzio Cory  MD NOTIFIED: Francia Greaves MD  TIME OF NOTIFICATION: 455p  RESPONSE: see orders

## 2017-09-02 ENCOUNTER — Other Ambulatory Visit: Payer: Self-pay

## 2017-09-02 NOTE — BHH Suicide Risk Assessment (Cosign Needed)
Suicide Risk Assessment  Discharge Assessment   [facility] Discharge Suicide Risk Assessment   Principal Problem: Alcohol dependence with alcohol-induced mood disorder [facility]) Discharge Diagnoses:  Patient Active Problem List   Diagnosis Date Noted  . Substance induced mood disorder ([place]) [F19.94] 07/16/2017  . Severe recurrent major depression without psychotic features ([place]) [F33.2] 05/15/2017  . Anemia [D64.9] 03/18/2017  . Acute blood loss anemia [D62] 02/21/2017  . UGI bleed [K92.2] 02/08/2017  . Alcohol withdrawal ([place]) [F10.239] 02/08/2017  . Cocaine abuse ([place]) [F14.10] 11/18/2016  . GI bleed [K92.2] 11/01/2016  . Alcohol abuse [F10.10]   . Major depressive disorder, recurrent severe without psychotic features ([place]) [F33.2] 07/06/2016  . Alcohol-induced mood disorder ([place]) [F10.94] 06/15/2016  . Seizure ([place]) [R56.9] 05/26/2016  . Tobacco abuse [Z72.0] 05/26/2016  . Homeless [Z59.0] 05/26/2016  . Essential hypertension [I10] 05/26/2016  . Alcohol dependence with alcohol-induced mood disorder ([place]) [F10.24] 02/14/2016  . Severe alcohol withdrawal without perceptual disturbances without complication ([place]) [T55.732] 02/14/2016  . Alcoholic hepatitis without ascites [K70.10] 02/14/2016  . Thrombocytopenia ([place]) [D69.6] 02/14/2016    Total Time spent with patient: 30 minutes  Musculoskeletal: Strength & Muscle Tone: within normal limits Gait & Station: normal Patient leans: N/A  Psychiatric Specialty Exam:   Blood pressure (!) 169/93, pulse 78, temperature 98.6 F (37 C), temperature source Oral, resp. rate 20, SpO2 97 %.There is no height or weight on file to calculate BMI.  General Appearance: Disheveled  Eye Sport and exercise psychologist::  Fair  Speech:  Slow and Slurred409  Volume:  Decreased  Mood:  Depressed  Affect:  Congruent  Thought Process:  Coherent  Orientation:  Full (Time, Place, and Person)  Thought Content:  Logical  Suicidal Thoughts:  No  Homicidal Thoughts:  No  Memory:   Immediate;   Good Recent;   Fair Remote;   Fair  Judgement:  Poor  Insight:  Lacking  Psychomotor Activity:  Decreased  Concentration:  Fair  Recall:  [employer] of Knowledge:Fair  Language: Good  Akathisia:  No  Handed:  Right  AIMS (if indicated):     Assets:  Communication Skills  Sleep:     Cognition: WNL  ADL's:  Intact   Mental Status Per Nursing Assessment::   On Admission:   Intoxicated  Demographic Factors:  Male, Caucasian, Low socioeconomic status and Unemployed  Loss Factors: Financial problems/change in socioeconomic status  Historical Factors: Impulsivity  Risk Reduction Factors:   Sense of responsibility to family  Continued Clinical Symptoms:  Depression:   Impulsivity Alcohol/Substance Abuse/Dependencies  Cognitive Features That Contribute To Risk:  Closed-mindedness    Suicide Risk:  Minimal: No identifiable suicidal ideation.  Patients presenting with no risk factors but with morbid ruminations; may be classified as minimal risk based on the severity of the depressive symptoms  Follow-up Information    Placey, Audrea Muscat, NP Follow up.   Contact information: [place] [address] [phone]           Plan Of Care/Follow-up recommendations:  Activity:  as tolerated  Diet:  Heart healthy  Ethelene Hal, NP 09/02/2017, 10:45 AM

## 2017-09-02 NOTE — BH Assessment (Signed)
[facility] Assessment Progress Note  Per Buford Dresser, DO, this pt does not require psychiatric hospitalization at this time.  Pt is to be discharged from [facility] with recommendation to follow up with [facility].  This has been included in pt's discharge instructions.  Pt's nurse, Nena Jordan, has been notified.  Jalene Mullet, [place] Triage Specialist [phone]

## 2017-09-02 NOTE — ED Notes (Signed)
Pt discharged safely with resources and a bus pass.  All belongings were returned to pt.

## 2017-09-02 NOTE — Discharge Instructions (Signed)
For your mental health needs, you are advised to follow up with [facility].  New and returning patients are seen at their walk-in clinic.  Walk-in hours are Monday - Friday from 8:00 am - 3:00 pm.  Walk-in patients are seen on a first come, first served basis.  Try to arrive as early as possible for he best chance of being seen the same day: ° °     [facility] °     [address] °     [place], [place] [address] °     [phone] °

## 2017-09-05 ENCOUNTER — Emergency Department (HOSPITAL_COMMUNITY)
Admission: EM | Admit: 2017-09-05 | Discharge: 2017-09-06 | Disposition: A | Discharge status: Home / Self Care | Payer: Self-pay | Attending: Emergency Medicine | Admitting: Emergency Medicine

## 2017-09-05 ENCOUNTER — Encounter (HOSPITAL_COMMUNITY): Payer: Self-pay | Admitting: Emergency Medicine

## 2017-09-05 ENCOUNTER — Other Ambulatory Visit: Payer: Self-pay

## 2017-09-05 DIAGNOSIS — I10 Essential (primary) hypertension: Secondary | ICD-10-CM | POA: Insufficient documentation

## 2017-09-05 DIAGNOSIS — F10920 Alcohol use, unspecified with intoxication, uncomplicated: Secondary | ICD-10-CM

## 2017-09-05 DIAGNOSIS — R45851 Suicidal ideations: Secondary | ICD-10-CM | POA: Insufficient documentation

## 2017-09-05 DIAGNOSIS — F17210 Nicotine dependence, cigarettes, uncomplicated: Secondary | ICD-10-CM | POA: Insufficient documentation

## 2017-09-05 DIAGNOSIS — F10120 Alcohol abuse with intoxication, uncomplicated: Secondary | ICD-10-CM | POA: Insufficient documentation

## 2017-09-05 DIAGNOSIS — Z79899 Other long term (current) drug therapy: Secondary | ICD-10-CM | POA: Insufficient documentation

## 2017-09-05 DIAGNOSIS — Z590 Homelessness: Secondary | ICD-10-CM | POA: Insufficient documentation

## 2017-09-05 LAB — COMPREHENSIVE METABOLIC PANEL
ALBUMIN: 4.3 g/dL (ref 3.5–5.0)
ALT: 65 U/L — ABNORMAL HIGH (ref 17–63)
ANION GAP: 11 (ref 5–15)
AST: 134 U/L — ABNORMAL HIGH (ref 15–41)
Alkaline Phosphatase: 113 U/L (ref 38–126)
BILIRUBIN TOTAL: 0.9 mg/dL (ref 0.3–1.2)
BUN: <5 mg/dL — ABNORMAL LOW (ref 6–20)
CO2: 24 mmol/L (ref 22–32)
Calcium: 9.2 mg/dL (ref 8.9–10.3)
Chloride: 103 mmol/L (ref 101–111)
Creatinine, Ser: 0.87 mg/dL (ref 0.61–1.24)
GFR calc Af Amer: >60 mL/min (ref >60)
GFR calc non Af Amer: >60 mL/min (ref >60)
Glucose, Bld: 101 mg/dL — ABNORMAL HIGH (ref 65–99)
POTASSIUM: 3.6 mmol/L (ref 3.5–5.1)
SODIUM: 138 mmol/L (ref 135–145)
TOTAL PROTEIN: 8.1 g/dL (ref 6.5–8.1)

## 2017-09-05 LAB — CBC
HEMATOCRIT: 34.8 % — AB (ref 39.0–52.0)
Hemoglobin: 10.5 g/dL — ABNORMAL LOW (ref 13.0–17.0)
MCH: 23.3 pg — ABNORMAL LOW (ref 26.0–34.0)
MCHC: 30.2 g/dL (ref 30.0–36.0)
MCV: 77.2 fL — ABNORMAL LOW (ref 78.0–100.0)
Platelets: 155 10*3/uL (ref 150–400)
RBC: 4.51 MIL/uL (ref 4.22–5.81)
RDW: 19.4 % — AB (ref 11.5–15.5)
WBC: 5.5 10*3/uL (ref 4.0–10.5)

## 2017-09-05 LAB — RAPID URINE DRUG SCREEN, HOSP PERFORMED
Amphetamines: NOT DETECTED
BENZODIAZEPINES: NOT DETECTED
COCAINE: NOT DETECTED
OPIATES: NOT DETECTED
Tetrahydrocannabinol: NOT DETECTED

## 2017-09-05 LAB — ACETAMINOPHEN LEVEL

## 2017-09-05 LAB — SALICYLATE LEVEL: Salicylate Lvl: <7 mg/dL (ref 2.8–30.0)

## 2017-09-05 LAB — ETHANOL: Alcohol, Ethyl (B): 399 mg/dL (ref <10)

## 2017-09-05 NOTE — ED Triage Notes (Signed)
Patient from town, he is homeless and having feelings of SI.  Patient states that he has been having his aura, but has not had a seizure at this time.  Patient does have some ETOH on board today and feels like he wants to die.

## 2017-09-06 ENCOUNTER — Other Ambulatory Visit: Payer: Self-pay

## 2017-09-06 MED ORDER — LORAZEPAM 1 MG PO TABS: 0.0000 mg | ORAL_TABLET | Freq: Two times a day (BID) | ORAL | Status: DC

## 2017-09-06 MED ORDER — THIAMINE HCL 100 MG/ML IJ SOLN: 100.0000 mg | Freq: Every day | INTRAMUSCULAR | Status: DC

## 2017-09-06 MED ORDER — LORAZEPAM 2 MG/ML IJ SOLN: 0.0000 mg | Freq: Two times a day (BID) | INTRAMUSCULAR | Status: DC

## 2017-09-06 MED ORDER — NICOTINE 21 MG/24HR TD PT24: 21.0000 mg | MEDICATED_PATCH | Freq: Every day | TRANSDERMAL | Status: DC

## 2017-09-06 MED ORDER — ONDANSETRON 4 MG PO TBDP
8.0000 mg | ORAL_TABLET | Freq: Once | ORAL | Status: AC
  Administered 2017-09-06: 8 mg via ORAL
  Filled 2017-09-06: qty 2

## 2017-09-06 MED ORDER — VITAMIN B-1 100 MG PO TABS: 100.0000 mg | ORAL_TABLET | Freq: Every day | ORAL | Status: DC

## 2017-09-06 MED ORDER — LORAZEPAM 2 MG/ML IJ SOLN
0.0000 mg | Freq: Four times a day (QID) | INTRAMUSCULAR | Status: DC
  Administered 2017-09-06: 2 mg via INTRAVENOUS
  Filled 2017-09-06: qty 1

## 2017-09-06 MED ORDER — LORAZEPAM 1 MG PO TABS: 0.0000 mg | ORAL_TABLET | Freq: Four times a day (QID) | ORAL | Status: DC

## 2017-09-06 NOTE — ED Notes (Signed)
Pt signed medical clearance policy
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
## 2017-09-06 NOTE — ED Notes (Signed)
Regular breakfast tray ordered with no sharps requested

## 2017-09-06 NOTE — ED Notes (Signed)
Patient ate breakfast and then walked to bathroom with no issues or complaints. Patient stated he did not want a shower at this time and was "ready to go".

## 2017-09-06 NOTE — ED Notes (Signed)
Pt will be discharged when they sober up.

## 2017-09-06 NOTE — ED Notes (Signed)
Pt belongings stored in locker #4 with 3 bags.

## 2017-09-11 ENCOUNTER — Emergency Department (HOSPITAL_COMMUNITY)
Admission: EM | Admit: 2017-09-11 | Discharge: 2017-09-12 | Disposition: A | Discharge status: Home / Self Care | Payer: Self-pay | Attending: Emergency Medicine | Admitting: Emergency Medicine

## 2017-09-11 ENCOUNTER — Emergency Department (HOSPITAL_COMMUNITY): Payer: Self-pay

## 2017-09-11 ENCOUNTER — Encounter (HOSPITAL_COMMUNITY): Payer: Self-pay

## 2017-09-11 DIAGNOSIS — F17210 Nicotine dependence, cigarettes, uncomplicated: Secondary | ICD-10-CM | POA: Insufficient documentation

## 2017-09-11 DIAGNOSIS — K2920 Alcoholic gastritis without bleeding: Secondary | ICD-10-CM | POA: Insufficient documentation

## 2017-09-11 DIAGNOSIS — Z79899 Other long term (current) drug therapy: Secondary | ICD-10-CM | POA: Insufficient documentation

## 2017-09-11 DIAGNOSIS — I10 Essential (primary) hypertension: Secondary | ICD-10-CM | POA: Insufficient documentation

## 2017-09-11 LAB — CBC
HCT: 32.9 % — ABNORMAL LOW (ref 39.0–52.0)
Hemoglobin: 9.9 g/dL — ABNORMAL LOW (ref 13.0–17.0)
MCH: 23.3 pg — AB (ref 26.0–34.0)
MCHC: 30.1 g/dL (ref 30.0–36.0)
MCV: 77.4 fL — AB (ref 78.0–100.0)
Platelets: 160 10*3/uL (ref 150–400)
RBC: 4.25 MIL/uL (ref 4.22–5.81)
RDW: 19.1 % — ABNORMAL HIGH (ref 11.5–15.5)
WBC: 4 10*3/uL (ref 4.0–10.5)

## 2017-09-11 LAB — HEPATIC FUNCTION PANEL
ALBUMIN: 3.9 g/dL (ref 3.5–5.0)
ALK PHOS: 149 U/L — AB (ref 38–126)
ALT: 58 U/L (ref 17–63)
AST: 138 U/L — ABNORMAL HIGH (ref 15–41)
BILIRUBIN DIRECT: 0.2 mg/dL (ref 0.1–0.5)
BILIRUBIN TOTAL: 0.4 mg/dL (ref 0.3–1.2)
Indirect Bilirubin: 0.2 mg/dL — ABNORMAL LOW (ref 0.3–0.9)
Total Protein: 7.4 g/dL (ref 6.5–8.1)

## 2017-09-11 LAB — BASIC METABOLIC PANEL
Anion gap: 12 (ref 5–15)
BUN: 5 mg/dL — ABNORMAL LOW (ref 6–20)
CALCIUM: 8.6 mg/dL — AB (ref 8.9–10.3)
CHLORIDE: 102 mmol/L (ref 101–111)
CO2: 24 mmol/L (ref 22–32)
Creatinine, Ser: 0.83 mg/dL (ref 0.61–1.24)
GFR calc non Af Amer: >60 mL/min (ref >60)
GLUCOSE: 114 mg/dL — AB (ref 65–99)
Potassium: 3.3 mmol/L — ABNORMAL LOW (ref 3.5–5.1)
Sodium: 138 mmol/L (ref 135–145)

## 2017-09-11 LAB — I-STAT TROPONIN, ED: TROPONIN I, POC: 0 ng/mL (ref 0.00–0.08)

## 2017-09-11 LAB — LIPASE, BLOOD: Lipase: 70 U/L — ABNORMAL HIGH (ref 11–51)

## 2017-09-11 MED ORDER — ONDANSETRON HCL 4 MG/2ML IJ SOLN
4.0000 mg | Freq: Once | INTRAMUSCULAR | Status: AC
  Administered 2017-09-11: 4 mg via INTRAVENOUS
  Filled 2017-09-11: qty 2

## 2017-09-11 MED ORDER — FAMOTIDINE IN NACL 20-0.9 MG/50ML-% IV SOLN
20.0000 mg | Freq: Once | INTRAVENOUS | Status: AC
  Administered 2017-09-11: 20 mg via INTRAVENOUS
  Filled 2017-09-11: qty 50

## 2017-09-11 MED ORDER — GI COCKTAIL ~~LOC~~
30.0000 mL | Freq: Once | ORAL | Status: AC
  Administered 2017-09-11: 30 mL via ORAL
  Filled 2017-09-11: qty 30

## 2017-09-11 MED ORDER — ONDANSETRON HCL 4 MG/2ML IJ SOLN: 4.0000 mg | Freq: Once | INTRAMUSCULAR | Status: DC

## 2017-09-11 MED ORDER — CHLORPROMAZINE HCL 25 MG PO TABS
25.0000 mg | ORAL_TABLET | Freq: Once | ORAL | Status: AC
  Administered 2017-09-11: 25 mg via ORAL
  Filled 2017-09-11: qty 1

## 2017-09-11 MED ORDER — SODIUM CHLORIDE 0.9 % IV BOLUS
500.0000 mL | Freq: Once | INTRAVENOUS | Status: AC
  Administered 2017-09-11: 500 mL via INTRAVENOUS

## 2017-09-11 NOTE — ED Notes (Signed)
Patient tolerated PO fluids well. No nausea at this time.

## 2017-09-11 NOTE — ED Provider Notes (Signed)
[facility] EMERGENCY DEPARTMENT Provider Note   CSN: [id number] Arrival date & time: 09/11/17  2103     History   Chief Complaint Chief Complaint  Patient presents with  . Chest Pain    HPI Barry Moore is a 52 y.o. male.  The history is provided by the patient. No language interpreter was used.  Chest Pain      Barry Moore is a 52 y.o. male who presents to the Emergency Department complaining of CP/AP. Resents to the emergency department for chest pain and abdominal pain that began earlier today. He reports several days to a week of vomiting and diarrhea with associated hiccups. He also endorses several weeks of cough and shortness of breath. Today he developed severe central chest and abdominal pain. Pain is constant and nonradiating. Vomiting and diarrhea has been persistent throughout the last week. He does drink alcohol daily. He drank four, 40 ounces beers today. He also endorses smoking crack yesterday. No prior similar symptoms. He has a history of hypertension but is not compliant with his medications.  Past Medical History:  Diagnosis Date  . Alcoholic hepatitis   . Depression   . ETOH abuse   . Hypertension   . Pancreatitis   . Seizures ([place])   . Suicidal behavior     Patient Active Problem List   Diagnosis Date Noted  . Substance induced mood disorder ([place]) 07/16/2017  . Severe recurrent major depression without psychotic features ([place]) 05/15/2017  . Anemia 03/18/2017  . Acute blood loss anemia 02/21/2017  . UGI bleed 02/08/2017  . Alcohol withdrawal ([place]) 02/08/2017  . Cocaine abuse ([place]) 11/18/2016  . GI bleed 11/01/2016  . Alcohol abuse   . Major depressive disorder, recurrent severe without psychotic features ([place]) 07/06/2016  . 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 Seizure ([place]) 05/26/2016  . Tobacco abuse 05/26/2016  . Homeless 05/26/2016  . Essential hypertension 05/26/2016  . Alcohol dependence with  alcohol-induced mood disorder ([place]) 02/14/2016  . Severe alcohol withdrawal without perceptual disturbances without complication ([place]) 62/13/0865  . Alcoholic hepatitis without ascites 02/14/2016  . Thrombocytopenia ([place]) 02/14/2016    Past Surgical History:  Procedure Laterality Date  . ESOPHAGOGASTRODUODENOSCOPY (EGD) WITH PROPOFOL N/A 11/02/2016   Procedure: ESOPHAGOGASTRODUODENOSCOPY (EGD) WITH PROPOFOL;  Surgeon: Carol Ada, MD;  Location: [facility];  Service: Endoscopy;  Laterality: N/A;  . HERNIA REPAIR    . LEG SURGERY          Home Medications    Prior to Admission medications   Medication Sig Start Date End Date Taking? Authorizing Provider  citalopram (CELEXA) 20 MG tablet Take 1 tablet (20 mg total) by mouth daily. For mood control Patient not taking: Reported on 09/01/2017 07/18/17   Money, Lowry Ram, FNP  famotidine (PEPCID) 20 MG tablet Take 1 tablet (20 mg total) by mouth 2 (two) times daily. 09/12/17   Quintella Reichert, MD  gabapentin (NEURONTIN) 300 MG capsule Take 1 capsule (300 mg total) by mouth 3 (three) times daily. For withdrawal symptoms Patient not taking: Reported on 09/01/2017 07/18/17   Money, Lowry Ram, FNP  naltrexone (DEPADE) 50 MG tablet Take 1 tablet (50 mg total) by mouth daily. For cravings Patient not taking: Reported on 09/01/2017 07/19/17   Money, Lowry Ram, FNP  ondansetron (ZOFRAN ODT) 4 MG disintegrating tablet Take 1 tablet (4 mg total) by mouth every 8 (eight) hours as needed for nausea or vomiting. 09/12/17   Quintella Reichert, MD  sucralfate ([place]) 1  GM/10ML suspension Take 10 mLs (1 g total) by mouth 4 (four) times daily -  with meals and at bedtime. 09/12/17   Quintella Reichert, MD  traZODone (DESYREL) 100 MG tablet Take 1 tablet (100 mg total) by mouth at bedtime. For insomnia Patient not taking: Reported on 09/01/2017 07/18/17   Money, Lowry Ram, FNP    Family History Family History  Problem Relation Age of Onset  . Diabetes Mother   .  Alcoholism Mother   . Emphysema Father   . Lung cancer Father   . Alcoholism Father     Social History Social History   Tobacco Use  . Smoking status: Current Every Day Smoker    Packs/day: 1.00    Types: Cigarettes  . Smokeless tobacco: Never Used  Substance Use Topics  . Alcohol use: Yes    Comment: 4-5 40oz beers daily; BAC .371  . Drug use: Yes    Frequency: 3.0 times per week    Types: Cocaine    Comment: +Coc     Allergies   Tomato and Aspirin   Review of Systems Review of Systems  Cardiovascular: Positive for chest pain.  All other systems reviewed and are negative.    Physical Exam Updated Vital Signs BP (!) 149/94   Pulse 98   Temp 98.8 F (37.1 C) (Oral)   Resp (!) 26   Ht 5\' 7"  (1.702 m)   Wt 77.1 kg (170 lb)   SpO2 95%   BMI 26.63 kg/m   Physical Exam  Constitutional: He is oriented to person, place, and time. He appears well-developed and well-nourished.  Smells of EtOH, frequent hiccups.    HENT:  Head: Normocephalic and atraumatic.  Cardiovascular: Normal rate and regular rhythm.  No murmur heard. Pulmonary/Chest: Effort normal and breath sounds normal. No respiratory distress.  Abdominal: Soft. There is no rebound and no guarding.  Moderate generalized abdominal tenderness  Musculoskeletal: He exhibits no edema or tenderness.  Neurological: He is alert and oriented to person, place, and time.  Skin: Skin is warm and dry.  Psychiatric: He has a normal mood and affect. His behavior is normal.  Nursing note and vitals reviewed.    ED Treatments / Results  Labs (all labs ordered are listed, but only abnormal results are displayed) Labs Reviewed  BASIC METABOLIC PANEL - Abnormal; Notable for the following components:      Result Value   Potassium 3.3 (*)    Glucose, Bld 114 (*)    BUN 5 (*)    Calcium 8.6 (*)    All other components within normal limits  CBC - Abnormal; Notable for the following components:   Hemoglobin 9.9 (*)     HCT 32.9 (*)    MCV 77.4 (*)    MCH 23.3 (*)    RDW 19.1 (*)    All other components within normal limits  HEPATIC FUNCTION PANEL - Abnormal; Notable for the following components:   AST 138 (*)    Alkaline Phosphatase 149 (*)    Indirect Bilirubin 0.2 (*)    All other components within normal limits  LIPASE, BLOOD - Abnormal; Notable for the following components:   Lipase 70 (*)    All other components within normal limits  I-STAT TROPONIN, ED    EKG EKG Interpretation  Date/Time:  Sunday September 11 2017 21:09:32 EDT Ventricular Rate:  96 PR Interval:    QRS Duration: 103 QT Interval:  352 QTC Calculation: 445 R Axis:  84 Text Interpretation:  Sinus rhythm Baseline wander Confirmed by Quintella Reichert [id number]) on 09/11/2017 9:25:56 PM   Radiology Dg Chest 2 View  Result Date: 09/11/2017 CLINICAL DATA:  Chest pain since 1200 hours today. EXAM: CHEST - 2 VIEW COMPARISON:  07/30/2017 FINDINGS: The heart size and mediastinal contours are within normal limits. Both lungs are clear. Mild dextroconvex curvature of the lower thoracic spine. IMPRESSION: No active cardiopulmonary disease. Electronically Signed   By: Ashley Royalty M.D.   On: 09/11/2017 22:28    Procedures Procedures (including critical care time)  Medications Ordered in ED Medications  chlorproMAZINE (THORAZINE) tablet 25 mg (25 mg Oral Given 09/11/17 2203)  famotidine (PEPCID) IVPB 20 mg premix ( Intravenous Stopped 09/11/17 2233)  ondansetron (ZOFRAN) injection 4 mg (4 mg Intravenous Given 09/11/17 2203)  sodium chloride 0.9 % bolus 500 mL (0 mLs Intravenous Stopped 09/11/17 2322)  gi cocktail (Maalox,Lidocaine,Donnatal) (30 mLs Oral Given 09/11/17 2342)     Initial Impression / Assessment and Plan / ED Course  I have reviewed the triage vital signs and the nursing notes.  Pertinent labs & imaging results that were available during my care of the patient were reviewed by me and considered in my medical decision  making (see chart for details).     Patient with history of EtOH abuse here for evaluation of nausea, vomiting, chest pain, abdominal pain, diarrhea. He has hiccups on initial evaluation. Following treatment his hiccups have resolved. He does have mild to moderate epigastric tenderness without peritoneal findings. Patient without any recurrent vomiting in the emergency department but he is still clinically intoxicated. Patient care transferred pending with capitalization of alcohol and re assessment. Current clinical picture is not consistent with acute pancreatitis, PE, ACS, dissection.  Final Clinical Impressions(s) / ED Diagnoses   Final diagnoses:  Acute alcoholic gastritis without hemorrhage    ED Discharge Orders        Ordered    ondansetron (ZOFRAN ODT) 4 MG disintegrating tablet  Every 8 hours PRN     09/12/17 0003    sucralfate (CARAFATE) 1 GM/10ML suspension  3 times daily with meals & bedtime     09/12/17 0003    famotidine (PEPCID) 20 MG tablet  2 times daily     09/12/17 0003       Quintella Reichert, MD 09/12/17 [phone]

## 2017-09-11 NOTE — ED Triage Notes (Signed)
Patient BIB [facility] for chest pain since 1200 today. Patient states he was sitting down when he "all of the sudden had this dull sharpe chest pain with nausea". Also c/o hiccups x 2 weeks. Reports drinking (2) 40oz/day. EMS gave 4 mg Zofran with relief.

## 2017-09-12 MED ORDER — FAMOTIDINE 20 MG PO TABS: 20.0000 mg | ORAL_TABLET | Freq: Two times a day (BID) | ORAL | 0 refills | Status: DC

## 2017-09-12 MED ORDER — SUCRALFATE 1 GM/10ML PO SUSP: 1.0000 g | Freq: Three times a day (TID) | ORAL | 0 refills | Status: DC

## 2017-09-12 MED ORDER — ONDANSETRON 4 MG PO TBDP: 4.0000 mg | ORAL_TABLET | Freq: Three times a day (TID) | ORAL | 0 refills | Status: DC | PRN

## 2017-09-12 NOTE — ED Notes (Signed)
Ambulated patient in hallway with minimal assistance.

## 2017-09-12 NOTE — ED Provider Notes (Signed)
Assumed care from Dr. Ralene Bathe at 0000.  Patient here with alcoholic gastritis.  Will be allowed to metabolize to freedom.  5:20 AM On reassessment patient clinically sober and able to ambulate without complication.  No evidence of withdrawal.  Tolerating oral hydration.  The patient is safe for discharge with strict return precautions.  Disposition: Discharge  Condition: Good  I have discussed the results, Dx and Tx plan with the patient who expressed understanding and agree(s) with the plan. Discharge instructions discussed at great length. The patient was given strict return precautions who verbalized understanding of the instructions. No further questions at time of discharge.    ED Discharge Orders        Ordered    ondansetron (ZOFRAN ODT) 4 MG disintegrating tablet  Every 8 hours PRN     09/12/17 0003    sucralfate (CARAFATE) 1 GM/10ML suspension  3 times daily with meals & bedtime     09/12/17 0003    famotidine (PEPCID) 20 MG tablet  2 times daily     09/12/17 0003       Follow Up: Marliss Coots, NP [place] [address] [phone]  Schedule an appointment as soon as possible for a visit        Cariann Kinnamon, Grayce Sessions, MD 09/12/17 [phone]

## 2017-09-17 ENCOUNTER — Encounter (HOSPITAL_COMMUNITY): Payer: Self-pay

## 2017-09-17 ENCOUNTER — Other Ambulatory Visit: Payer: Self-pay

## 2017-09-17 ENCOUNTER — Emergency Department (HOSPITAL_COMMUNITY)
Admission: EM | Admit: 2017-09-17 | Discharge: 2017-09-17 | Disposition: A | Discharge status: Home / Self Care | Payer: Self-pay | Attending: Emergency Medicine | Admitting: Emergency Medicine

## 2017-09-17 DIAGNOSIS — F17210 Nicotine dependence, cigarettes, uncomplicated: Secondary | ICD-10-CM | POA: Insufficient documentation

## 2017-09-17 DIAGNOSIS — Z79899 Other long term (current) drug therapy: Secondary | ICD-10-CM | POA: Insufficient documentation

## 2017-09-17 DIAGNOSIS — F10239 Alcohol dependence with withdrawal, unspecified: Secondary | ICD-10-CM | POA: Insufficient documentation

## 2017-09-17 DIAGNOSIS — I10 Essential (primary) hypertension: Secondary | ICD-10-CM | POA: Insufficient documentation

## 2017-09-17 MED ORDER — VITAMIN B-1 100 MG PO TABS
100.0000 mg | ORAL_TABLET | Freq: Every day | ORAL | Status: DC
  Administered 2017-09-17: 100 mg via ORAL
  Filled 2017-09-17: qty 1

## 2017-09-17 MED ORDER — CHLORDIAZEPOXIDE HCL 25 MG PO CAPS
50.0000 mg | ORAL_CAPSULE | Freq: Once | ORAL | Status: AC
  Administered 2017-09-17: 50 mg via ORAL
  Filled 2017-09-17: qty 2

## 2017-09-17 MED ORDER — FOLIC ACID 1 MG PO TABS
1.0000 mg | ORAL_TABLET | Freq: Every day | ORAL | Status: DC
  Administered 2017-09-17: 1 mg via ORAL
  Filled 2017-09-17: qty 1

## 2017-09-17 MED ORDER — ADULT MULTIVITAMIN W/MINERALS CH
1.0000 | ORAL_TABLET | Freq: Every day | ORAL | Status: DC
  Administered 2017-09-17: 1 via ORAL
  Filled 2017-09-17: qty 1

## 2017-09-17 MED ORDER — THIAMINE HCL 100 MG/ML IJ SOLN: 100.0000 mg | Freq: Every day | INTRAMUSCULAR | Status: DC

## 2017-09-17 MED ORDER — CHLORDIAZEPOXIDE HCL 25 MG PO CAPS: ORAL_CAPSULE | ORAL | 0 refills | Status: DC

## 2017-09-17 NOTE — ED Triage Notes (Signed)
Pt from [place] parking lot via EMS c/o malaise and ETOH withdrawal seizures. Pt not post-ictal on scene. Pt is A&O and in NAD

## 2017-09-17 NOTE — Discharge Instructions (Addendum)
Your evaluated in the emergency department for possible seizure after trying to taper your alcohol use.  We are prescribing you some medications to help with withdrawal symptoms but it is important for you to not be drinking when you are taking these medications.  We are also giving you the list of detox areas to consider going somewhere for detox help.  Please return to the emergency department if any worsening symptoms.

## 2017-09-17 NOTE — ED Notes (Signed)
Bed: [facility] Expected date:  Expected time:  Means of arrival:  Comments: 52 yo ETOH, seizures

## 2017-09-17 NOTE — ED Provider Notes (Signed)
[facility] DEPT Provider Note   CSN: [id number] Arrival date & time: 09/17/17  1110     History   Chief Complaint Chief Complaint  Patient presents with  . Alcohol Intoxication  . Seizures    HPI Barry Moore is a 52 y.o. male.  He is here after possibly having an alcohol withdrawal seizure.  He is a daily drinker usually 6 - 40 ounce beers a day but has been trying to cut back and when he does he gets shaky.  Unclear if he truly had a seizure or just had some shaking.  It was not associated with any incontinence.  This is his fifth visit here this month for either abdominal pain/gastritis or alcohol intoxication or withdrawal.  When asked if he did consider detox he was thinking he might because he really wants to stop drinking.  The history is provided by the patient.  Alcohol Intoxication  This is a new problem. The problem occurs daily. Associated symptoms include abdominal pain. Pertinent negatives include no chest pain, no headaches and no shortness of breath. Exacerbated by: drinking. Relieved by: drinking. He has tried nothing for the symptoms. The treatment provided no relief.  Seizures   This is a recurrent problem. There was 1 seizure. Pertinent negatives include no headaches, no visual disturbance, no sore throat, no chest pain, no nausea and no vomiting. Characteristics do not include bowel incontinence, bladder incontinence or bit tongue. shaking The seizures did not continue in the ED. There has been no fever. There were no medications administered prior to arrival.    Past Medical History:  Diagnosis Date  . Alcoholic hepatitis   . Depression   . ETOH abuse   . Hypertension   . Pancreatitis   . Seizures ([place])   . Suicidal behavior     Patient Active Problem List   Diagnosis Date Noted  . Substance induced mood disorder ([place]) 07/16/2017  . Severe recurrent major depression without psychotic features ([place]) 05/15/2017  . Anemia  03/18/2017  . Acute blood loss anemia 02/21/2017  . UGI bleed 02/08/2017  . Alcohol withdrawal ([place]) 02/08/2017  . Cocaine abuse ([place]) 11/18/2016  . GI bleed 11/01/2016  . Alcohol abuse   . Major depressive disorder, recurrent severe without psychotic features ([place]) 07/06/2016  . 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 Seizure ([place]) 05/26/2016  . Tobacco abuse 05/26/2016  . Homeless 05/26/2016  . Essential hypertension 05/26/2016  . Alcohol dependence with alcohol-induced mood disorder ([place]) 02/14/2016  . Severe alcohol withdrawal without perceptual disturbances without complication ([place]) 78/93/8101  . Alcoholic hepatitis without ascites 02/14/2016  . Thrombocytopenia ([place]) 02/14/2016    Past Surgical History:  Procedure Laterality Date  . ESOPHAGOGASTRODUODENOSCOPY (EGD) WITH PROPOFOL N/A 11/02/2016   Procedure: ESOPHAGOGASTRODUODENOSCOPY (EGD) WITH PROPOFOL;  Surgeon: Carol Ada, MD;  Location: [facility] ENDOSCOPY;  Service: Endoscopy;  Laterality: N/A;  . HERNIA REPAIR    . LEG SURGERY          Home Medications    Prior to Admission medications   Medication Sig Start Date End Date Taking? Authorizing Provider  citalopram (CELEXA) 20 MG tablet Take 1 tablet (20 mg total) by mouth daily. For mood control Patient not taking: Reported on 09/01/2017 07/18/17   Money, Lowry Ram, FNP  famotidine (PEPCID) 20 MG tablet Take 1 tablet (20 mg total) by mouth 2 (two) times daily. 09/12/17   Quintella Reichert, MD  gabapentin (NEURONTIN) 300 MG capsule Take 1 capsule (300 mg total)  by mouth 3 (three) times daily. For withdrawal symptoms Patient not taking: Reported on 09/01/2017 07/18/17   Money, Lowry Ram, FNP  naltrexone (DEPADE) 50 MG tablet Take 1 tablet (50 mg total) by mouth daily. For cravings Patient not taking: Reported on 09/01/2017 07/19/17   Money, Lowry Ram, FNP  ondansetron (ZOFRAN ODT) 4 MG disintegrating tablet Take 1 tablet (4 mg total) by mouth every 8 (eight) hours as needed  for nausea or vomiting. 09/12/17   Quintella Reichert, MD  sucralfate (CARAFATE) 1 GM/10ML suspension Take 10 mLs (1 g total) by mouth 4 (four) times daily -  with meals and at bedtime. 09/12/17   Quintella Reichert, MD  traZODone (DESYREL) 100 MG tablet Take 1 tablet (100 mg total) by mouth at bedtime. For insomnia Patient not taking: Reported on 09/01/2017 07/18/17   Money, Lowry Ram, FNP    Family History Family History  Problem Relation Age of Onset  . Diabetes Mother   . Alcoholism Mother   . Emphysema Father   . Lung cancer Father   . Alcoholism Father     Social History Social History   Tobacco Use  . Smoking status: Current Every Day Smoker    Packs/day: 1.00    Types: Cigarettes  . Smokeless tobacco: Never Used  Substance Use Topics  . Alcohol use: Yes    Comment: 4-5 40oz beers daily; BAC .371  . Drug use: Yes    Frequency: 3.0 times per week    Types: Cocaine    Comment: +Coc     Allergies   Tomato and Aspirin   Review of Systems Review of Systems  Constitutional: Negative for fever.  HENT: Negative for sore throat.   Eyes: Negative for visual disturbance.  Respiratory: Negative for shortness of breath.   Cardiovascular: Negative for chest pain.  Gastrointestinal: Positive for abdominal pain. Negative for bowel incontinence, nausea and vomiting.  Genitourinary: Negative for bladder incontinence and dysuria.  Musculoskeletal: Negative for neck pain.  Skin: Negative for rash.  Neurological: Positive for seizures. Negative for headaches.     Physical Exam Updated Vital Signs BP (!) 134/92   Pulse 87   Temp 98.1 F (36.7 C) (Oral)   Resp 18   SpO2 97%   Physical Exam  Constitutional: He appears well-developed and well-nourished.  HENT:  Head: Normocephalic and atraumatic.  Right Ear: External ear normal.  Left Ear: External ear normal.  Nose: Nose normal.  Mouth/Throat: Oropharynx is clear and moist.  Eyes: Pupils are equal, round, and reactive to  light. Conjunctivae are normal. No scleral icterus.  Neck: Neck supple.  Cardiovascular: Normal rate and regular rhythm.  No murmur heard. Pulmonary/Chest: Effort normal and breath sounds normal. No respiratory distress.  Abdominal: Soft. There is no tenderness.  Musculoskeletal: He exhibits no edema or deformity.  Neurological: He is alert. He has normal strength. Gait abnormal. GCS eye subscore is 4. GCS verbal subscore is 5. GCS motor subscore is 6.  Skin: Skin is warm and dry.  Psychiatric: He has a normal mood and affect.  Nursing note and vitals reviewed.    ED Treatments / Results  Labs (all labs ordered are listed, but only abnormal results are displayed) Labs Reviewed - No data to display  EKG None  Radiology No results found.  Procedures Procedures (including critical care time)  Medications Ordered in ED Medications  thiamine (VITAMIN B-1) tablet 100 mg (100 mg Oral Given 09/17/17 1423)    Or  thiamine (B-1) injection  100 mg ( Intravenous See Alternative 11/03/92 7076)  folic acid (FOLVITE) tablet 1 mg (1 mg Oral Given 09/17/17 1423)  multivitamin with minerals tablet 1 tablet (1 tablet Oral Given 09/17/17 1423)  chlordiazePOXIDE (LIBRIUM) capsule 50 mg (50 mg Oral Given 09/17/17 1423)     Initial Impression / Assessment and Plan / ED Course  I have reviewed the triage vital signs and the nursing notes.  Pertinent labs & imaging results that were available during my care of the patient were reviewed by me and considered in my medical decision making (see chart for details).  Clinical Course as of Sep 17 1541  Sat Sep 17, 2017  1252 Patient is here with possible seizure-like activity in the setting of trying to taper his alcohol intake.  Patient is not particularly hypertensive and not tachycardic and has been ambulatory in the department without much difficulty.  Not really sure he had a seizure but at least had some tremulous activity.  He has been interested in  stopping rankings are working to give him a dose of some Librium here along with vitamins and numbers for detox beds.   [MB]    Clinical Course User Index [MB] Hayden Rasmussen, MD      Final Clinical Impressions(s) / ED Diagnoses   Final diagnoses:  Alcohol dependence with withdrawal with complication [facility])    ED Discharge Orders        Ordered    chlordiazePOXIDE (LIBRIUM) 25 MG capsule     09/17/17 1454       Hayden Rasmussen, MD 09/17/17 1544

## 2017-09-25 ENCOUNTER — Encounter (HOSPITAL_COMMUNITY): Payer: Self-pay | Admitting: Emergency Medicine

## 2017-09-25 ENCOUNTER — Emergency Department (HOSPITAL_COMMUNITY)
Admission: EM | Admit: 2017-09-25 | Discharge: 2017-09-26 | Disposition: A | Discharge status: Home / Self Care | Payer: Self-pay | Attending: Emergency Medicine | Admitting: Emergency Medicine

## 2017-09-25 ENCOUNTER — Emergency Department (HOSPITAL_COMMUNITY): Payer: Self-pay

## 2017-09-25 DIAGNOSIS — R569 Unspecified convulsions: Secondary | ICD-10-CM

## 2017-09-25 DIAGNOSIS — F1010 Alcohol abuse, uncomplicated: Secondary | ICD-10-CM | POA: Insufficient documentation

## 2017-09-25 DIAGNOSIS — D508 Other iron deficiency anemias: Secondary | ICD-10-CM | POA: Insufficient documentation

## 2017-09-25 DIAGNOSIS — I10 Essential (primary) hypertension: Secondary | ICD-10-CM | POA: Insufficient documentation

## 2017-09-25 DIAGNOSIS — E876 Hypokalemia: Secondary | ICD-10-CM | POA: Insufficient documentation

## 2017-09-25 DIAGNOSIS — F17210 Nicotine dependence, cigarettes, uncomplicated: Secondary | ICD-10-CM | POA: Insufficient documentation

## 2017-09-25 LAB — CBC
HCT: 35.2 % — ABNORMAL LOW (ref 39.0–52.0)
Hemoglobin: 10.4 g/dL — ABNORMAL LOW (ref 13.0–17.0)
MCH: 23.4 pg — ABNORMAL LOW (ref 26.0–34.0)
MCHC: 29.5 g/dL — ABNORMAL LOW (ref 30.0–36.0)
MCV: 79.1 fL (ref 78.0–100.0)
PLATELETS: 98 10*3/uL — AB (ref 150–400)
RBC: 4.45 MIL/uL (ref 4.22–5.81)
RDW: 18.7 % — ABNORMAL HIGH (ref 11.5–15.5)
WBC: 3.5 10*3/uL — AB (ref 4.0–10.5)

## 2017-09-25 LAB — BASIC METABOLIC PANEL
Anion gap: 14 (ref 5–15)
BUN: 5 mg/dL — ABNORMAL LOW (ref 6–20)
CALCIUM: 9 mg/dL (ref 8.9–10.3)
CHLORIDE: 102 mmol/L (ref 98–111)
CO2: 19 mmol/L — ABNORMAL LOW (ref 22–32)
CREATININE: 0.76 mg/dL (ref 0.61–1.24)
GFR calc non Af Amer: >60 mL/min (ref >60)
Glucose, Bld: 93 mg/dL (ref 70–99)
Potassium: 3.3 mmol/L — ABNORMAL LOW (ref 3.5–5.1)
Sodium: 135 mmol/L (ref 135–145)

## 2017-09-25 LAB — RAPID URINE DRUG SCREEN, HOSP PERFORMED
Amphetamines: NOT DETECTED
BENZODIAZEPINES: NOT DETECTED
COCAINE: NOT DETECTED
Opiates: NOT DETECTED
Tetrahydrocannabinol: NOT DETECTED

## 2017-09-25 LAB — ETHANOL: ALCOHOL ETHYL (B): 334 mg/dL — AB (ref <10)

## 2017-09-25 MED ORDER — POTASSIUM CHLORIDE CRYS ER 20 MEQ PO TBCR
40.0000 meq | EXTENDED_RELEASE_TABLET | Freq: Once | ORAL | Status: AC
  Administered 2017-09-25: 40 meq via ORAL
  Filled 2017-09-25: qty 2

## 2017-09-25 NOTE — ED Notes (Signed)
Pt ambulatory with stand-by assist, steady gait. C/o back pain.

## 2017-09-25 NOTE — ED Notes (Signed)
Pt states last drink was at 6pm tonight, states he has had about 3 40 oz beers. States he typically drinks about 6-7 40oz beers a day.

## 2017-09-25 NOTE — ED Notes (Signed)
Pt reports having 4 seizures today and one yesterday. States this occurs almost daily, pt doe not take any medications for seizures

## 2017-09-25 NOTE — ED Triage Notes (Signed)
[facility] from [facility], EMS got called out for another pt and he decided he wanted to come to the hospital as well. Pt states he had a "seizure" this AM bc he drank X2 40's vs his normal 4-5 40's. A/OX4, VSS.

## 2017-09-25 NOTE — ED Notes (Signed)
Patient did not answer when called x 3 in the lobby

## 2017-09-25 NOTE — ED Provider Notes (Signed)
[facility] EMERGENCY DEPARTMENT Provider Note   CSN: [id number] Arrival date & time: 09/25/17  1905     History   Chief Complaint Chief Complaint  Patient presents with  . Seizures    HPI Barry Moore is a 52 y.o. male with a hx of EtOH abuse, HTN, pancreatitis, seizures presents to the Emergency Department complaining of acute onset seizure around 4pm today.  He denies urinary incontinence with today's episode.  Pt reports this happens when he slows down on his drinking.  He reports today he has had 4 forty oz beers and he normally drinks 7+ each day.  Pt reports reports he has been trying to cut back for awhile.  Pt reports intermittent crack cocaine usage, last time was 1 week ago.  Pt denies fever, chills, headache, neck pain, chest pain, SOB, abd pain, N/VD.  Pt does report chronic back pain, worse after the seizure.  He thinks he might have fallen.    Per EMS, they were approached by ambulatory patient who requested transport to the hospital for evaluation of seizure earlier in the day.  They report pt A&Ox4 with them and no seizure activity during their care.    The history is provided by the patient and medical records. No language interpreter was used.    Past Medical History:  Diagnosis Date  . Alcoholic hepatitis   . Depression   . ETOH abuse   . Hypertension   . Pancreatitis   . Seizures ([place])   . Suicidal behavior     Patient Active Problem List   Diagnosis Date Noted  . Substance induced mood disorder ([place]) 07/16/2017  . Severe recurrent major depression without psychotic features ([place]) 05/15/2017  . Anemia 03/18/2017  . Acute blood loss anemia 02/21/2017  . UGI bleed 02/08/2017  . Alcohol withdrawal ([place]) 02/08/2017  . Cocaine abuse ([place]) 11/18/2016  . GI bleed 11/01/2016  . Alcohol abuse   . Major depressive disorder, recurrent severe without psychotic features ([place]) 07/06/2016  . 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  Seizure ([place]) 05/26/2016  . Tobacco abuse 05/26/2016  . Homeless 05/26/2016  . Essential hypertension 05/26/2016  . Alcohol dependence with alcohol-induced mood disorder ([place]) 02/14/2016  . Severe alcohol withdrawal without perceptual disturbances without complication ([place]) 41/96/2229  . Alcoholic hepatitis without ascites 02/14/2016  . Thrombocytopenia ([place]) 02/14/2016    Past Surgical History:  Procedure Laterality Date  . ESOPHAGOGASTRODUODENOSCOPY (EGD) WITH PROPOFOL N/A 11/02/2016   Procedure: ESOPHAGOGASTRODUODENOSCOPY (EGD) WITH PROPOFOL;  Surgeon: Carol Ada, MD;  Location: [facility] END[facility]COPY;  Service: Endoscopy;  Laterality: N/A;  . HERNIA REPAIR    . LEG SURGERY          Home Medications    Prior to Admission medications   Medication Sig Start Date End Date Taking? Authorizing Provider  chlordiazePOXIDE (LIBRIUM) 25 MG capsule 50mg  PO TID x 1D, then 25-50mg  PO BID X 1D, then 25-50mg  PO QD X 1D Patient not taking: Reported on 09/25/2017 09/17/17   Hayden Rasmussen, MD  citalopram (CELEXA) 20 MG tablet Take 1 tablet (20 mg total) by mouth daily. For mood control Patient not taking: Reported on 09/17/2017 07/18/17   Money, Lowry Ram, FNP  famotidine (PEPCID) 20 MG tablet Take 1 tablet (20 mg total) by mouth 2 (two) times daily. Patient not taking: Reported on 09/25/2017 09/12/17   Quintella Reichert, MD  gabapentin (NEURONTIN) 300 MG capsule Take 1 capsule (300 mg total) by mouth 3 (three) times daily.  For withdrawal symptoms Patient not taking: Reported on 09/17/2017 07/18/17   Money, Lowry Ram, FNP  naltrexone (DEPADE) 50 MG tablet Take 1 tablet (50 mg total) by mouth daily. For cravings Patient not taking: Reported on 09/17/2017 07/19/17   Money, Lowry Ram, FNP  ondansetron (ZOFRAN ODT) 4 MG disintegrating tablet Take 1 tablet (4 mg total) by mouth every 8 (eight) hours as needed for nausea or vomiting. Patient not taking: Reported on 09/25/2017 09/12/17   Quintella Reichert, MD  sucralfate  (CARAFATE) 1 GM/10ML suspension Take 10 mLs (1 g total) by mouth 4 (four) times daily -  with meals and at bedtime. Patient not taking: Reported on 09/17/2017 09/12/17   Quintella Reichert, MD  traZODone (DESYREL) 100 MG tablet Take 1 tablet (100 mg total) by mouth at bedtime. For insomnia Patient not taking: Reported on 09/17/2017 07/18/17   Money, Lowry Ram, FNP    Family History Family History  Problem Relation Age of Onset  . Diabetes Mother   . Alcoholism Mother   . Emphysema Father   . Lung cancer Father   . Alcoholism Father     Social History Social History   Tobacco Use  . Smoking status: Current Every Day Smoker    Packs/day: 1.00    Types: Cigarettes  . Smokeless tobacco: Never Used  Substance Use Topics  . Alcohol use: Yes    Comment: 4-5 40oz beers daily  . Drug use: Yes    Frequency: 3.0 times per week    Types: Cocaine     Allergies   Tomato and Aspirin   Review of Systems Review of Systems  Constitutional: Negative for appetite change, diaphoresis, fatigue, fever and unexpected weight change.  HENT: Negative for mouth sores.   Eyes: Negative for visual disturbance.  Respiratory: Negative for cough, chest tightness, shortness of breath and wheezing.   Cardiovascular: Negative for chest pain.  Gastrointestinal: Negative for abdominal pain, constipation, diarrhea, nausea and vomiting.  Endocrine: Negative for polydipsia, polyphagia and polyuria.  Genitourinary: Negative for dysuria, frequency, hematuria and urgency.  Musculoskeletal: Positive for back pain. Negative for neck stiffness.  Skin: Negative for rash.  Allergic/Immunologic: Negative for immunocompromised state.  Neurological: Positive for seizures. Negative for syncope, light-headedness and headaches.  Hematological: Does not bruise/bleed easily.  Psychiatric/Behavioral: Negative for sleep disturbance. The patient is not nervous/anxious.      Physical Exam Updated Vital Signs BP (!) 144/94    Pulse 82   Resp 18   Ht 5\' 7"  (1.702 m)   Wt 77.1 kg (170 lb)   SpO2 96%   BMI 26.63 kg/m   Physical Exam  Constitutional: He appears well-developed and well-nourished. No distress.  Awake, alert, nontoxic appearance  HENT:  Head: Normocephalic and atraumatic.  Mouth/Throat: Oropharynx is clear and moist. No oropharyngeal exudate.  Eyes: Pupils are equal, round, and reactive to light. Conjunctivae and EOM are normal. No scleral icterus.  Neck: Normal range of motion. Neck supple.  Full ROM without pain  Cardiovascular: Normal rate, regular rhythm and intact distal pulses.  Pulmonary/Chest: Effort normal and breath sounds normal. No respiratory distress. He has no wheezes.  Equal chest expansion  Abdominal: Soft. Bowel sounds are normal. He exhibits no distension and no mass. There is no tenderness. There is no rebound and no guarding.  Musculoskeletal: He exhibits no edema.       Lumbar back: He exhibits tenderness and pain.       Back:  Midline and paraspinal tenderness to the  L-spine.  No midline or paraspinal tenderness to the T-spine.  Lymphadenopathy:    He has no cervical adenopathy.  Neurological: He is alert.  Speech is clear and goal oriented, follows commands Normal 5/5 strength in upper and lower extremities bilaterally including dorsiflexion and plantar flexion, strong and equal grip strength Sensation normal to light and sharp touch Moves extremities without ataxia, coordination intact Normal gait Normal balance No Clonus  Skin: Skin is warm and dry. No rash noted. He is not diaphoretic. No erythema.  Psychiatric: He has a normal mood and affect. His behavior is normal.  Nursing note and vitals reviewed.    ED Treatments / Results  Labs (all labs ordered are listed, but only abnormal results are displayed) Labs Reviewed  BASIC METABOLIC PANEL - Abnormal; Notable for the following components:      Result Value   Potassium 3.3 (*)    CO2 19 (*)    BUN 5  (*)    All other components within normal limits  CBC - Abnormal; Notable for the following components:   WBC 3.5 (*)    Hemoglobin 10.4 (*)    HCT 35.2 (*)    MCH 23.4 (*)    MCHC 29.5 (*)    RDW 18.7 (*)    Platelets 98 (*)    All other components within normal limits  ETHANOL - Abnormal; Notable for the following components:   Alcohol, Ethyl (B) 334 (*)    All other components within normal limits  RAPID URINE DRUG SCREEN, HOSP PERFORMED - Abnormal; Notable for the following components:   Barbiturates   (*)    Value: Result not available. Reagent lot number recalled by manufacturer.   All other components within normal limits     Radiology Dg Lumbar Spine Complete  Result Date: 09/25/2017 CLINICAL DATA:  Initial evaluation for acute trauma, fall, lower back pain. EXAM: LUMBAR SPINE - COMPLETE 4+ VIEW COMPARISON:  None. FINDINGS: Vertebral bodies normally aligned with preservation of the normal lumbar lordosis. No listhesis. Vertebral body heights maintained without evidence for acute or chronic fracture. Visualized sacrum intact. 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 No acute soft tissue abnormality. IMPRESSION: 1. No acute abnormality within the lumbar spine. 2. 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 Electronically Signed   By: Jeannine Boga M.D.   On: 09/25/2017 23:30    Procedures Procedures (including critical care time)  Medications Ordered in ED Medications  potassium chloride SA (K-DUR,KLOR-CON) CR tablet 40 mEq (40 mEq Oral Given 09/25/17 2336)     Initial Impression / Assessment and Plan / ED Course  I have reviewed the triage vital signs and the nursing notes.  Pertinent labs & imaging results that were available during my care of the patient were reviewed by me and considered in my medical decision making (see chart for details).  Clinical Course as of Sep 26 99  Sun Sep 25, 2017  2259 Mild; will replace in the ED  Potassium(!): 3.3 [HM]    2304 baseline  Hemoglobin(!): 10.4 [HM]  Mon Sep 26, 2017  0029 Pt ambulates with steady gait.  No seizure like activity in the ED.  Pt remains A&Ox4.  No shaking, sweating, vomiting, hallucinations.  Pt has tolerated PO.   [HM]  0030 Initial tachycardia and HTN have resolved without intervention.    Pulse Rate(!): 102 [HM]    Clinical Course User Index [HM] Abdulmalik Darco, Gwenlyn Perking    Presents with complaints of seizure earlier in the day and back  pain after episode.  He is alert and oriented on arrival.  He ambulates without difficulty.  Patient reports his seizures are due to alcohol withdrawal.  Record review shows that patient has been seen numerous times for seizure-like activity and alcohol intoxication in the past.  He has not required admission to the hospital for this previously.  Evaluation today shows alcohol level of greater than 300.  He has no clinical signs of alcohol withdrawal at this time.  He is not sweating, shaking, vomiting or altered.  Initial blood pressure was elevated however this resolved without intervention.  Initial heart rate was 102 however this also resolved.  He is not tachycardic on my exam.  His labs appear to be baseline.  Mild hypokalemia was replaced here in the emergency department.  Plain films of his L-spine are without evidence of fracture.  I personally evaluated these images.  Patient is ambulatory and tolerating p.o. here in the emergency department.  At this time there is no emergent medical condition which requires intervention.  Discussed with patient possible detox.  He is not interested today.  Discussed reasons to return to the emergency department and patient understands.  Final Clinical Impressions(s) / ED Diagnoses   Final diagnoses:  Seizure-like activity ([place])  Alcohol abuse  Hypokalemia  Iron deficiency anemia secondary to inadequate dietary iron intake    ED Discharge Orders    None       Loni Muse Gwenlyn Perking 09/26/17  0106    Milton Ferguson, MD 09/27/17 1027

## 2017-09-25 NOTE — ED Notes (Signed)
Patient transported to X-ray 

## 2017-09-25 NOTE — ED Notes (Signed)
ED Provider at bedside. 

## 2017-09-25 NOTE — ED Notes (Signed)
Ethanol of 334 received from lab.

## 2017-09-26 NOTE — Discharge Instructions (Addendum)
1. Medications: usual home medications 2. Treatment: rest, drink plenty of fluids,  3. Follow Up: Please followup with your primary doctor in 1-2 days for discussion of your diagnoses and further evaluation after today's visit; if you do not have a primary care doctor use the resource guide provided to find one; Please return to the ER for return of seizures or other concerns

## 2017-09-28 ENCOUNTER — Emergency Department (HOSPITAL_COMMUNITY)
Admission: EM | Admit: 2017-09-28 | Discharge: 2017-09-30 | Disposition: A | Discharge status: Home / Self Care | Payer: Self-pay | Attending: Emergency Medicine | Admitting: Emergency Medicine

## 2017-09-28 DIAGNOSIS — I10 Essential (primary) hypertension: Secondary | ICD-10-CM | POA: Insufficient documentation

## 2017-09-28 DIAGNOSIS — R45851 Suicidal ideations: Secondary | ICD-10-CM | POA: Insufficient documentation

## 2017-09-28 DIAGNOSIS — Z008 Encounter for other general examination: Secondary | ICD-10-CM | POA: Insufficient documentation

## 2017-09-28 DIAGNOSIS — F332 Major depressive disorder, recurrent severe without psychotic features: Secondary | ICD-10-CM | POA: Insufficient documentation

## 2017-09-28 DIAGNOSIS — Y908 Blood alcohol level of 240 mg/100 ml or more: Secondary | ICD-10-CM | POA: Insufficient documentation

## 2017-09-28 DIAGNOSIS — R569 Unspecified convulsions: Secondary | ICD-10-CM | POA: Insufficient documentation

## 2017-09-28 DIAGNOSIS — F17210 Nicotine dependence, cigarettes, uncomplicated: Secondary | ICD-10-CM | POA: Insufficient documentation

## 2017-09-28 DIAGNOSIS — D649 Anemia, unspecified: Secondary | ICD-10-CM | POA: Insufficient documentation

## 2017-09-28 DIAGNOSIS — E7220 Disorder of urea cycle metabolism, unspecified: Secondary | ICD-10-CM | POA: Insufficient documentation

## 2017-09-28 DIAGNOSIS — F10920 Alcohol use, unspecified with intoxication, uncomplicated: Secondary | ICD-10-CM

## 2017-09-28 DIAGNOSIS — F10229 Alcohol dependence with intoxication, unspecified: Secondary | ICD-10-CM | POA: Insufficient documentation

## 2017-09-29 ENCOUNTER — Emergency Department (HOSPITAL_COMMUNITY): Payer: Self-pay

## 2017-09-29 ENCOUNTER — Other Ambulatory Visit: Payer: Self-pay

## 2017-09-29 ENCOUNTER — Encounter (HOSPITAL_COMMUNITY): Payer: Self-pay

## 2017-09-29 LAB — SALICYLATE LEVEL

## 2017-09-29 LAB — COMPREHENSIVE METABOLIC PANEL
ALT: 56 U/L — ABNORMAL HIGH (ref 0–44)
AST: 163 U/L — ABNORMAL HIGH (ref 15–41)
Albumin: 3.8 g/dL (ref 3.5–5.0)
Alkaline Phosphatase: 118 U/L (ref 38–126)
Anion gap: 10 (ref 5–15)
BUN: <5 mg/dL — ABNORMAL LOW (ref 6–20)
CHLORIDE: 105 mmol/L (ref 98–111)
CO2: 23 mmol/L (ref 22–32)
Calcium: 8.6 mg/dL — ABNORMAL LOW (ref 8.9–10.3)
Creatinine, Ser: 0.89 mg/dL (ref 0.61–1.24)
Glucose, Bld: 125 mg/dL — ABNORMAL HIGH (ref 70–99)
Potassium: 3.5 mmol/L (ref 3.5–5.1)
Sodium: 138 mmol/L (ref 135–145)
Total Bilirubin: 0.9 mg/dL (ref 0.3–1.2)
Total Protein: 6.8 g/dL (ref 6.5–8.1)

## 2017-09-29 LAB — CBC
HCT: 32.6 % — ABNORMAL LOW (ref 39.0–52.0)
Hemoglobin: 9.9 g/dL — ABNORMAL LOW (ref 13.0–17.0)
MCH: 24 pg — AB (ref 26.0–34.0)
MCHC: 30.4 g/dL (ref 30.0–36.0)
MCV: 79.1 fL (ref 78.0–100.0)
PLATELETS: 114 10*3/uL — AB (ref 150–400)
RBC: 4.12 MIL/uL — AB (ref 4.22–5.81)
RDW: 18.7 % — ABNORMAL HIGH (ref 11.5–15.5)
WBC: 3.4 10*3/uL — AB (ref 4.0–10.5)

## 2017-09-29 LAB — PROTIME-INR
INR: 1.04
Prothrombin Time: 13.5 seconds (ref 11.4–15.2)

## 2017-09-29 LAB — RAPID URINE DRUG SCREEN, HOSP PERFORMED
AMPHETAMINES: NOT DETECTED
BENZODIAZEPINES: POSITIVE — AB
Cocaine: POSITIVE — AB
OPIATES: NOT DETECTED
Tetrahydrocannabinol: NOT DETECTED

## 2017-09-29 LAB — AMMONIA: AMMONIA: 75 umol/L — AB (ref 9–35)

## 2017-09-29 LAB — ETHANOL: ALCOHOL ETHYL (B): 386 mg/dL — AB (ref <10)

## 2017-09-29 LAB — ACETAMINOPHEN LEVEL: Acetaminophen (Tylenol), Serum: <10 ug/mL — ABNORMAL LOW (ref 10–30)

## 2017-09-29 MED ORDER — LORAZEPAM 2 MG/ML IJ SOLN: 0.0000 mg | Freq: Four times a day (QID) | INTRAMUSCULAR | Status: DC

## 2017-09-29 MED ORDER — ONDANSETRON HCL 4 MG PO TABS: 4.0000 mg | ORAL_TABLET | Freq: Three times a day (TID) | ORAL | Status: DC | PRN

## 2017-09-29 MED ORDER — BENZTROPINE MESYLATE 1 MG PO TABS: 1.0000 mg | ORAL_TABLET | Freq: Four times a day (QID) | ORAL | Status: DC | PRN

## 2017-09-29 MED ORDER — LORAZEPAM 1 MG PO TABS
2.0000 mg | ORAL_TABLET | Freq: Once | ORAL | Status: AC
  Administered 2017-09-29: 2 mg via ORAL
  Filled 2017-09-29: qty 2

## 2017-09-29 MED ORDER — NICOTINE 21 MG/24HR TD PT24
21.0000 mg | MEDICATED_PATCH | Freq: Every day | TRANSDERMAL | Status: DC
  Administered 2017-09-30: 21 mg via TRANSDERMAL
  Filled 2017-09-29: qty 1

## 2017-09-29 MED ORDER — LORAZEPAM 1 MG PO TABS: 0.0000 mg | ORAL_TABLET | Freq: Two times a day (BID) | ORAL | Status: DC

## 2017-09-29 MED ORDER — CHLORDIAZEPOXIDE HCL 25 MG PO CAPS: ORAL_CAPSULE | ORAL | 0 refills | Status: DC

## 2017-09-29 MED ORDER — LORAZEPAM 2 MG/ML IJ SOLN: 0.0000 mg | Freq: Two times a day (BID) | INTRAMUSCULAR | Status: DC

## 2017-09-29 MED ORDER — ADULT MULTIVITAMIN W/MINERALS CH
1.0000 | ORAL_TABLET | Freq: Once | ORAL | Status: AC
  Administered 2017-09-29: 1 via ORAL
  Filled 2017-09-29: qty 1

## 2017-09-29 MED ORDER — LORAZEPAM 1 MG PO TABS
0.0000 mg | ORAL_TABLET | Freq: Four times a day (QID) | ORAL | Status: DC
  Administered 2017-09-29: 2 mg via ORAL
  Administered 2017-09-29: 1 mg via ORAL
  Administered 2017-09-30: 2 mg via ORAL
  Filled 2017-09-29: qty 1
  Filled 2017-09-29 (×2): qty 2

## 2017-09-29 MED ORDER — LORAZEPAM 1 MG PO TABS
2.0000 mg | ORAL_TABLET | Freq: Once | ORAL | Status: AC
Start: 2017-09-29 — End: 2017-09-29
  Administered 2017-09-29: 2 mg via ORAL
  Filled 2017-09-29: qty 2

## 2017-09-29 MED ORDER — VITAMIN B-1 100 MG PO TABS
100.0000 mg | ORAL_TABLET | Freq: Every day | ORAL | Status: DC
  Administered 2017-09-29 – 2017-09-30 (×2): 100 mg via ORAL
  Filled 2017-09-29 (×2): qty 1

## 2017-09-29 MED ORDER — HALOPERIDOL 1 MG PO TABS: 5.0000 mg | ORAL_TABLET | Freq: Four times a day (QID) | ORAL | Status: DC | PRN

## 2017-09-29 MED ORDER — VITAMIN B-1 100 MG PO TABS
100.0000 mg | ORAL_TABLET | Freq: Once | ORAL | Status: AC
Start: 2017-09-29 — End: 2017-09-29
  Administered 2017-09-29: 100 mg via ORAL
  Filled 2017-09-29: qty 1

## 2017-09-29 MED ORDER — THIAMINE HCL 100 MG/ML IJ SOLN: 100.0000 mg | Freq: Every day | INTRAMUSCULAR | Status: DC

## 2017-09-29 MED ORDER — CHLORDIAZEPOXIDE HCL 25 MG PO CAPS
100.0000 mg | ORAL_CAPSULE | Freq: Once | ORAL | Status: AC
  Administered 2017-09-29: 100 mg via ORAL
  Filled 2017-09-29: qty 4

## 2017-09-29 MED ORDER — LORAZEPAM 2 MG/ML IJ SOLN: 2.0000 mg | Freq: Once | INTRAMUSCULAR | Status: DC

## 2017-09-29 MED ORDER — CHLORDIAZEPOXIDE HCL 25 MG PO CAPS
25.0000 mg | ORAL_CAPSULE | Freq: Once | ORAL | Status: AC
  Administered 2017-09-29: 25 mg via ORAL
  Filled 2017-09-29: qty 1

## 2017-09-29 MED ORDER — LORAZEPAM 1 MG PO TABS: 2.0000 mg | ORAL_TABLET | Freq: Once | ORAL | Status: DC

## 2017-09-29 NOTE — ED Triage Notes (Signed)
Pt arrives via EMS. Pt found outside (homeless) where source reported full body seizure like activity. Unknown length. Pt reports recent decrease in ETOH consumption. Last drink 1 hour ago. Per EMS, pt having SI thoughts of slitting his throat. EMS took pt pocket knife, ED staff to put with security.

## 2017-09-29 NOTE — ED Notes (Signed)
Pt discussed with Dr. Tyrone Nine.

## 2017-09-29 NOTE — ED Notes (Signed)
Per staffing, sitter not available till 0700. Will continue to monitor.
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
## 2017-09-29 NOTE — Progress Notes (Signed)
Patient's referral packet has been completed and faxed to [facility].  Per clinician Barbera Setters at [facility] they have 59 male detox bed available today and are awaiting pt's referral.  This CSW in disposition will continue to follow up with potential placement at [facility]. Patient has been psychiatrically cleared by [name] NP, today, on 09/29/17.   Verlon Setting, MSW, [name] Clinical social worker in [facility], [place] Office [phone] and [phone] 09/29/2017 11:41 AM

## 2017-09-29 NOTE — ED Notes (Signed)
Pt returned from CT °

## 2017-09-29 NOTE — ED Notes (Signed)
Regular/no sharps ordered

## 2017-09-29 NOTE — ED Notes (Signed)
Patient transported to CT 

## 2017-09-29 NOTE — ED Notes (Signed)
Pt's BP was 174/98, informed Millie-RN.

## 2017-09-29 NOTE — ED Provider Notes (Signed)
Assumed patient care at noon, patient is a 52 year old male here for suicidal ideation and alcoholism.  He had a prolonged stay in the emergency department while they are waiting to see if he met inpatient criteria.  Plans were made to send him to [facility] however there is no bed available.  The nurse became concerned because he was persistently high on the CIWA and having some difficulty with ambulation.  He is able to ambulate with very minimal assistance that he feels this is significantly different from his baseline.  He is having a mild headache and thinks he may have fallen and struck his head recently.  Will obtain a CT of the head.  Re-dose Ativan and Librium.  This is negative I feel he is safe for discharge and follow-up in our care.  CT is negative, will discharge home.   Deno Etienne, DO 09/29/17 2214

## 2017-09-29 NOTE — ED Notes (Signed)
Pt received lunch tray 

## 2017-09-29 NOTE — ED Notes (Signed)
Pts CIWA is still elevated at 16. Pt sweating with tremors. Will speak with md.

## 2017-09-29 NOTE — Progress Notes (Addendum)
ED CSW met with pt. Pt informed CSW [place] Pt declined shelters. Pt interested in [place]. Pt gave verbal permission for CSW to reach out to additional rehab centers.   CSW called Vulnerable Population advocate, Anner Crete. Updated Anner Crete about pt. Anner Crete plans to meet pt to complete SOARS application, if we are able to secure a location of where he [place] the woods. CSW will check back with pt.   CSW called [facility]. [facility] stated that pt has to call back tomorrow. CSW explained that pt does not have a phone. [facility] stated that pt would have to go [facility] to see the Peer Support Specialist there tomorrow to have pt call [facility] to check bed availability.   CSW called [facility] in [place], [place]. [facility] will take [facility]ts without [facility]. [facility] is currently full. [facility] encouraged pt to call back tomorrow around noon.   Update: Spoke with peer support, Gaspar Bidding. Gaspar Bidding provided another number for [facility]. [facility] confirmed that they do not have bed available. Encouraged call back tomorrow at 9 am.   CSW called [facility] to check bed availability. No beds available.   5:30 PM Pt stated that his tent is behind the [facility] on [address] near the [place]. CSW and RNCM updated pt about Vulnerable Population Advocate coming out to meet him to complete SOARS application for disability and to update him about [facility] bed availability tomorrow. Pt agreed to that. Pt agreed to going to a different rehab center if [facility] cannot take him.   CSW provided pt with bus tickets.   10:15 Checked on pt's status. CSW, RN CM, and vulnerable population advocate continue to plan to meet with pt at 10:30 tomorrow. Provided nurse with taxi voucher if pt requires it tonight.   Wendelyn Breslow, [facility]  [phone]

## 2017-09-29 NOTE — ED Notes (Signed)
TTS in progress 

## 2017-09-29 NOTE — ED Notes (Signed)
Belongings collected, pt in purple scrubs, seizure pad applied

## 2017-09-29 NOTE — ED Notes (Addendum)
Pt sitting up in bed and eating [place] sandwhich. Drinking drink. Pt warm and dry.minimal  tremor noted.c/o headache 8/10 after waking

## 2017-09-29 NOTE — ED Notes (Signed)
Pt wanded by security. TTS placed at bedside.

## 2017-09-29 NOTE — ED Notes (Signed)
Discussed lab values and ambulation of pt with Dr. Tyrone Nine. Pt ambulated to Pod E for evaluation of gait and pt by Dr. Tyrone Nine. Tolerates fairly well and returns to [place] new c/o

## 2017-09-29 NOTE — ED Notes (Signed)
Pt ambulates in hallway with minimal assist.

## 2017-09-29 NOTE — ED Notes (Signed)
Belongings inventoried and in locker 13. Valuables with security. Envelope number [id number]

## 2017-09-29 NOTE — ED Notes (Signed)
Social worker here to see how pt is. Told her that will repeat CIWA and see how pt feels. Social worker to ED md to speak with him.

## 2017-09-29 NOTE — ED Notes (Signed)
In to see pt, pt states that he still feels bad. Still has tremors and headache. Will re-eval pt shortly to assess his CIWA. Sitter at bedside.

## 2017-09-29 NOTE — ED Notes (Signed)
Pt and sitter are aware that a urine sample is needed from pt.
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
## 2017-09-29 NOTE — ED Provider Notes (Signed)
[facility] EMERGENCY DEPARTMENT Provider Note   CSN: [id number] Arrival date & time: 09/28/17  2359     History   Chief Complaint Chief Complaint  Patient presents with  . Seizures  . Suicidal    HPI Barry Moore is a 52 y.o. male.  Patient presents to the emergency department by ambulance.  He was brought from a local fast food restaurant where he had a seizure-like episode.  Patient is intoxicated, admits to having a drink within the last hour.  He reports that he has had seizures before, both with withdrawal as well as with drinking.  There is no injury.  Patient reports that he is suicidal.  He reports that he was planning on stabbing himself with a knife.  He had a knife in his possession upon arrival to the ER.     Past Medical History:  Diagnosis Date  . Alcoholic hepatitis   . Depression   . ETOH abuse   . Hypertension   . Pancreatitis   . Seizures ([place])   . Suicidal behavior     Patient Active Problem List   Diagnosis Date Noted  . Substance induced mood disorder ([place]) 07/16/2017  . Severe recurrent major depression without psychotic features ([place]) 05/15/2017  . Anemia 03/18/2017  . Acute blood loss anemia 02/21/2017  . UGI bleed 02/08/2017  . Alcohol withdrawal ([place]) 02/08/2017  . Cocaine abuse ([place]) 11/18/2016  . GI bleed 11/01/2016  . Alcohol abuse   . Major depressive disorder, recurrent severe without psychotic features ([place]) 07/06/2016  . 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 Seizure ([place]) 05/26/2016  . Tobacco abuse 05/26/2016  . Homeless 05/26/2016  . Essential hypertension 05/26/2016  . Alcohol dependence with alcohol-induced mood disorder ([place]) 02/14/2016  . Severe alcohol withdrawal without perceptual disturbances without complication ([place]) 25/85/2778  . Alcoholic hepatitis without ascites 02/14/2016  . Thrombocytopenia ([place]) 02/14/2016    Past Surgical History:  Procedure Laterality Date  .  ESOPHAGOGASTRODUODENOSCOPY (EGD) WITH PROPOFOL N/A 11/02/2016   Procedure: ESOPHAGOGASTRODUODENOSCOPY (EGD) WITH PROPOFOL;  Surgeon: Carol Ada, MD;  Location: [facility] END[facility]COPY;  Service: Endoscopy;  Laterality: N/A;  . HERNIA REPAIR    . LEG SURGERY          Home Medications    Prior to Admission medications   Medication Sig Start Date End Date Taking? Authorizing Provider  chlordiazePOXIDE (LIBRIUM) 25 MG capsule 50mg  PO TID x 1D, then 25-50mg  PO BID X 1D, then 25-50mg  PO QD X 1D Patient not taking: Reported on 09/25/2017 09/17/17   Hayden Rasmussen, MD  citalopram (CELEXA) 20 MG tablet Take 1 tablet (20 mg total) by mouth daily. For mood control Patient not taking: Reported on 09/17/2017 07/18/17   Money, Lowry Ram, FNP  famotidine (PEPCID) 20 MG tablet Take 1 tablet (20 mg total) by mouth 2 (two) times daily. Patient not taking: Reported on 09/25/2017 09/12/17   Quintella Reichert, MD  gabapentin (NEURONTIN) 300 MG capsule Take 1 capsule (300 mg total) by mouth 3 (three) times daily. For withdrawal symptoms Patient not taking: Reported on 09/17/2017 07/18/17   Money, Lowry Ram, FNP  naltrexone (DEPADE) 50 MG tablet Take 1 tablet (50 mg total) by mouth daily. For cravings Patient not taking: Reported on 09/17/2017 07/19/17   Money, Lowry Ram, FNP  ondansetron (ZOFRAN ODT) 4 MG disintegrating tablet Take 1 tablet (4 mg total) by mouth every 8 (eight) hours as needed for nausea or vomiting. Patient not taking: Reported on 09/25/2017  09/12/17   Quintella Reichert, MD  sucralfate (CARAFATE) 1 GM/10ML suspension Take 10 mLs (1 g total) by mouth 4 (four) times daily -  with meals and at bedtime. Patient not taking: Reported on 09/17/2017 09/12/17   Quintella Reichert, MD  traZODone (DESYREL) 100 MG tablet Take 1 tablet (100 mg total) by mouth at bedtime. For insomnia Patient not taking: Reported on 09/17/2017 07/18/17   Money, Lowry Ram, FNP    Family History Family History  Problem Relation Age of Onset  . Diabetes  Mother   . Alcoholism Mother   . Emphysema Father   . Lung cancer Father   . Alcoholism Father     Social History Social History   Tobacco Use  . Smoking status: Current Every Day Smoker    Packs/day: 1.00    Types: Cigarettes  . Smokeless tobacco: Never Used  Substance Use Topics  . Alcohol use: Yes    Comment: 4-5 40oz beers daily  . Drug use: Yes    Frequency: 3.0 times per week    Types: Cocaine     Allergies   Tomato and Aspirin   Review of Systems Review of Systems  Neurological: Positive for seizures.  Psychiatric/Behavioral: Positive for dysphoric mood and suicidal ideas.  All other systems reviewed and are negative.    Physical Exam Updated Vital Signs BP 133/83   Pulse 86   Temp 98.9 F (37.2 C) (Oral)   Resp 16   Ht 5\' 7"  (1.702 m)   Wt 77.1 kg (170 lb)   SpO2 99%   BMI 26.63 kg/m   Physical Exam  Constitutional: He is oriented to person, place, and time. He appears well-developed and well-nourished. No distress.  Patient is filthy and disheveled, malodorous  HENT:  Head: Normocephalic and atraumatic.  Right Ear: Hearing normal.  Left Ear: Hearing normal.  Nose: Nose normal.  Mouth/Throat: Oropharynx is clear and moist and mucous membranes are normal.  Eyes: Pupils are equal, round, and reactive to light. Conjunctivae and EOM are normal.  Neck: Normal range of motion. Neck supple.  Cardiovascular: Regular rhythm, S1 normal and S2 normal. Exam reveals no gallop and no friction rub.  No murmur heard. Pulmonary/Chest: Effort normal and breath sounds normal. No respiratory distress. He exhibits no tenderness.  Abdominal: Soft. Normal appearance and bowel sounds are normal. There is no hepatosplenomegaly. There is no tenderness. There is no rebound, no guarding, no tenderness at McBurney's point and negative Murphy's sign. No hernia.  Musculoskeletal: Normal range of motion.  Neurological: He is alert and oriented to person, place, and time. He  has normal strength. No cranial nerve deficit or sensory deficit. Coordination normal. GCS eye subscore is 4. GCS verbal subscore is 5. GCS motor subscore is 6.  Skin: Skin is warm, dry and intact. No rash noted. No cyanosis.  Psychiatric: His speech is slurred. He is slowed and withdrawn. He exhibits a depressed mood. He expresses suicidal ideation.  Nursing note and vitals reviewed.    ED Treatments / Results  Labs (all labs ordered are listed, but only abnormal results are displayed) Labs Reviewed  COMPREHENSIVE METABOLIC PANEL - Abnormal; Notable for the following components:      Result Value   Glucose, Bld 125 (*)    BUN <5 (*)    Calcium 8.6 (*)    AST 163 (*)    ALT 56 (*)    All other components within normal limits  ETHANOL - Abnormal; Notable for the following  components:   Alcohol, Ethyl (B) 386 (*)    All other components within normal limits  ACETAMINOPHEN LEVEL - Abnormal; Notable for the following components:   Acetaminophen (Tylenol), Serum <10 (*)    All other components within normal limits  CBC - Abnormal; Notable for the following components:   WBC 3.4 (*)    RBC 4.12 (*)    Hemoglobin 9.9 (*)    HCT 32.6 (*)    MCH 24.0 (*)    RDW 18.7 (*)    Platelets 114 (*)    All other components within normal limits  AMMONIA - Abnormal; Notable for the following components:   Ammonia 75 (*)    All other components within normal limits  SALICYLATE LEVEL  PROTIME-INR  RAPID URINE DRUG SCREEN, HOSP PERFORMED    EKG None  Radiology No results found.  Procedures Procedures (including critical care time)  Medications Ordered in ED Medications - No data to display   Initial Impression / Assessment and Plan / ED Course  I have reviewed the triage vital signs and the nursing notes.  Pertinent labs & imaging results that were available during my care of the patient were reviewed by me and considered in my medical decision making (see chart for details).       Patient presents to the emergency department intoxicated.  He was outside and someone apparently witnessed him having seizure-like activity.  Patient reports a history of seizures.  Various records indicate seizures with withdrawal as well as seizures not related to withdrawal in this patient.  No seizure activity has been witnessed here in the ER.  He was obviously intoxicated on arrival.  Blood work essentially normal other than mildly elevated ammonia.  Likely would benefit from some lactulose which will be administered when he is more awake and alert.  Patient did have an active suicide plan.  He presented to the ER with a knife on him and his plan was to kill himself with a knife, will have behavioral health evaluation.  Final Clinical Impressions(s) / ED Diagnoses   Final diagnoses:  Hyperammonemia ([place])  Seizure ([place])  Suicidal ideations  Alcoholic intoxication without complication [facility])    ED Discharge Orders    None       Betsey Holiday Gwenyth Allegra, MD 09/29/17 [phone]

## 2017-09-30 ENCOUNTER — Other Ambulatory Visit: Payer: Self-pay

## 2017-09-30 NOTE — ED Notes (Signed)
Patient is sitting in bed watching TV , waiting SW .

## 2017-09-30 NOTE — Progress Notes (Addendum)
CSW updated advocate from [facility] that pt is in the ED. Advocate will meet pt in the ED.   Pt accepted at [facility]. CSW updated RN, number to call at [facility].   Pt decided he no longer wants to go to [facility]. Pt stated that he just wants to go home. CSW called to update [facility].   Update: [facility] application completed with Advocate. Clothes provided to pt.   Wendelyn Breslow, [facility]  [phone]

## 2017-09-30 NOTE — ED Notes (Signed)
Patient discharged . He is homeless however has refused a  Resources offered.

## 2017-09-30 NOTE — ED Notes (Signed)
Called to speak with Dr. Tyrone Nine, but md had already left. Spoke with PA Humes about order for ativan for pt that md put in. Wanted to verify that he only wanted 2mg  of po Ativan from the elevated CIWA score, and not another 2mg  of Ativan on top of the other 2mg . Verified to only give the 2mg  Ativan and 100mg  Librum per order.

## 2017-09-30 NOTE — ED Notes (Signed)
Waiting SW re- eval.

## 2017-09-30 NOTE — ED Notes (Signed)
Spoke with Agricultural consultant about pt status to be discharged. Feel like pt needs to be reassessed throughout the night and will be seen by social workers and can be discharged tomorrow per plan. Will continue CIWA throughout the night and monitor pts VS. Charge RN aware and agrees and will speak with MD.

## 2017-10-06 ENCOUNTER — Encounter (HOSPITAL_COMMUNITY): Payer: Self-pay

## 2017-10-06 ENCOUNTER — Emergency Department (HOSPITAL_COMMUNITY)
Admission: EM | Admit: 2017-10-06 | Discharge: 2017-10-07 | Disposition: A | Discharge status: Home / Self Care | Payer: Self-pay | Attending: Emergency Medicine | Admitting: Emergency Medicine

## 2017-10-06 DIAGNOSIS — I10 Essential (primary) hypertension: Secondary | ICD-10-CM | POA: Insufficient documentation

## 2017-10-06 DIAGNOSIS — F1994 Other psychoactive substance use, unspecified with psychoactive substance-induced mood disorder: Secondary | ICD-10-CM | POA: Insufficient documentation

## 2017-10-06 DIAGNOSIS — F10929 Alcohol use, unspecified with intoxication, unspecified: Secondary | ICD-10-CM | POA: Insufficient documentation

## 2017-10-06 DIAGNOSIS — F17210 Nicotine dependence, cigarettes, uncomplicated: Secondary | ICD-10-CM | POA: Insufficient documentation

## 2017-10-06 DIAGNOSIS — Z79899 Other long term (current) drug therapy: Secondary | ICD-10-CM | POA: Insufficient documentation

## 2017-10-06 LAB — COMPREHENSIVE METABOLIC PANEL
ALBUMIN: 3.7 g/dL (ref 3.5–5.0)
ALK PHOS: 107 U/L (ref 38–126)
ALT: 42 U/L (ref 0–44)
ANION GAP: 9 (ref 5–15)
AST: 82 U/L — ABNORMAL HIGH (ref 15–41)
BUN: 6 mg/dL (ref 6–20)
CO2: 24 mmol/L (ref 22–32)
Calcium: 8.5 mg/dL — ABNORMAL LOW (ref 8.9–10.3)
Chloride: 107 mmol/L (ref 98–111)
Creatinine, Ser: 0.72 mg/dL (ref 0.61–1.24)
GFR calc non Af Amer: >60 mL/min (ref >60)
GLUCOSE: 98 mg/dL (ref 70–99)
POTASSIUM: 3.6 mmol/L (ref 3.5–5.1)
SODIUM: 140 mmol/L (ref 135–145)
Total Bilirubin: 0.2 mg/dL — ABNORMAL LOW (ref 0.3–1.2)
Total Protein: 7.2 g/dL (ref 6.5–8.1)

## 2017-10-06 LAB — CBC
HEMATOCRIT: 30.7 % — AB (ref 39.0–52.0)
HEMOGLOBIN: 9.4 g/dL — AB (ref 13.0–17.0)
MCH: 24.2 pg — ABNORMAL LOW (ref 26.0–34.0)
MCHC: 30.6 g/dL (ref 30.0–36.0)
MCV: 79.1 fL (ref 78.0–100.0)
PLATELETS: 213 10*3/uL (ref 150–400)
RBC: 3.88 MIL/uL — AB (ref 4.22–5.81)
RDW: 18.2 % — AB (ref 11.5–15.5)
WBC: 4.1 10*3/uL (ref 4.0–10.5)

## 2017-10-06 NOTE — ED Notes (Signed)
Black sneakers, pants and shirt. Locked in locker # 30, 1 backpack also.

## 2017-10-06 NOTE — ED Triage Notes (Signed)
Pt reports that he has been having SZ ad was shaking his hands and talking. Pt reports that he is tired of being "homeless and wish he could die, and has nothing to live for." Pt is reported to have Ethol use.    EMS v/s 142/90 HR 894 RR 16, 97% RA

## 2017-10-06 NOTE — ED Notes (Signed)
Pt presents with SI, plan to jump into traffic or cut self with knife.  Denies HI, complaint of hearing voices.  Feeling hopeless, admits to drinking beer today.  A&O x 3, no distress noted, calm & cooperative.  Monitoring for safety, sitter at bedside.

## 2017-10-07 LAB — ETHANOL: ALCOHOL ETHYL (B): 263 mg/dL — AB (ref <10)

## 2017-10-07 LAB — SALICYLATE LEVEL

## 2017-10-07 LAB — ACETAMINOPHEN LEVEL: Acetaminophen (Tylenol), Serum: <10 ug/mL — ABNORMAL LOW (ref 10–30)

## 2017-10-07 MED ORDER — LORAZEPAM 1 MG PO TABS
1.0000 mg | ORAL_TABLET | Freq: Once | ORAL | Status: AC
  Administered 2017-10-07: 1 mg via ORAL
  Filled 2017-10-07: qty 1

## 2017-10-07 MED ORDER — ONDANSETRON 8 MG PO TBDP
8.0000 mg | ORAL_TABLET | Freq: Once | ORAL | Status: AC
  Administered 2017-10-07: 8 mg via ORAL
  Filled 2017-10-07: qty 1

## 2017-10-07 NOTE — ED Provider Notes (Signed)
[facility] DEPT Provider Note   CSN: [id number] Arrival date & time: 10/06/17  2209     History   Chief Complaint Chief Complaint  Patient presents with  . Suicidal    HPI Barry Moore is a 52 y.o. male.   52 year old male well-known to the emergency department presents today for evaluation.  He reports suicidal ideation to nursing with plan to jump into traffic or cut himself with a knife.  During my encounter with the patient, he is sleeping comfortably.  Upon waking he states that he has been feeling weak and feels as though he may have had a seizure recently.  He does admit to drinking beer today.  He has a long-standing history of alcohol abuse.  He has no complaints of homicidal ideations.  No complaints of hallucinations.     Past Medical History:  Diagnosis Date  . Alcoholic hepatitis   . Depression   . ETOH abuse   . Hypertension   . Pancreatitis   . Seizures ([place])   . Suicidal behavior     Patient Active Problem List   Diagnosis Date Noted  . Substance induced mood disorder ([place]) 07/16/2017  . Severe recurrent major depression without psychotic features ([place]) 05/15/2017  . Anemia 03/18/2017  . Acute blood loss anemia 02/21/2017  . UGI bleed 02/08/2017  . Alcohol withdrawal ([place]) 02/08/2017  . Cocaine abuse ([place]) 11/18/2016  . GI bleed 11/01/2016  . Alcohol abuse   . Major depressive disorder, recurrent severe without psychotic features ([place]) 07/06/2016  . 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 Seizure ([place]) 05/26/2016  . Tobacco abuse 05/26/2016  . Homeless 05/26/2016  . Essential hypertension 05/26/2016  . Alcohol dependence with alcohol-induced mood disorder ([place]) 02/14/2016  . Severe alcohol withdrawal without perceptual disturbances without complication ([place]) 49/67/5916  . Alcoholic hepatitis without ascites 02/14/2016  . Thrombocytopenia ([place]) 02/14/2016    Past Surgical History:  Procedure  Laterality Date  . ESOPHAGOGASTRODUODENOSCOPY (EGD) WITH PROPOFOL N/A 11/02/2016   Procedure: ESOPHAGOGASTRODUODENOSCOPY (EGD) WITH PROPOFOL;  Surgeon: Carol Ada, MD;  Location: [facility] END[facility];  Service: Endoscopy;  Laterality: N/A;  . HERNIA REPAIR    . LEG SURGERY          Home Medications    Prior to Admission medications   Medication Sig Start Date End Date Taking? Authorizing Provider  chlordiazePOXIDE (LIBRIUM) 25 MG capsule 50mg  PO TID x 1D, then 25-50mg  PO BID X 1D, then 25-50mg  PO QD X 1D Patient not taking: Reported on 10/06/2017 09/29/17   Deno Etienne, DO  citalopram (CELEXA) 20 MG tablet Take 1 tablet (20 mg total) by mouth daily. For mood control Patient not taking: Reported on 09/17/2017 07/18/17   Money, Lowry Ram, FNP  famotidine (PEPCID) 20 MG tablet Take 1 tablet (20 mg total) by mouth 2 (two) times daily. Patient not taking: Reported on 09/25/2017 09/12/17   Quintella Reichert, MD  gabapentin (NEURONTIN) 300 MG capsule Take 1 capsule (300 mg total) by mouth 3 (three) times daily. For withdrawal symptoms Patient not taking: Reported on 09/17/2017 07/18/17   Money, Lowry Ram, FNP  naltrexone (DEPADE) 50 MG tablet Take 1 tablet (50 mg total) by mouth daily. For cravings Patient not taking: Reported on 09/17/2017 07/19/17   Money, Lowry Ram, FNP  ondansetron (ZOFRAN ODT) 4 MG disintegrating tablet Take 1 tablet (4 mg total) by mouth every 8 (eight) hours as needed for nausea or vomiting. Patient not taking: Reported on 09/25/2017 09/12/17  Quintella Reichert, MD  sucralfate (CARAFATE) 1 GM/10ML suspension Take 10 mLs (1 g total) by mouth 4 (four) times daily -  with meals and at bedtime. Patient not taking: Reported on 09/17/2017 09/12/17   Quintella Reichert, MD  traZODone (DESYREL) 100 MG tablet Take 1 tablet (100 mg total) by mouth at bedtime. For insomnia Patient not taking: Reported on 09/17/2017 07/18/17   Money, Lowry Ram, FNP    Family History Family History  Problem Relation Age of Onset    . Diabetes Mother   . Alcoholism Mother   . Emphysema Father   . Lung cancer Father   . Alcoholism Father     Social History Social History   Tobacco Use  . Smoking status: Current Every Day Smoker    Packs/day: 1.00    Types: Cigarettes  . Smokeless tobacco: Never Used  Substance Use Topics  . Alcohol use: Yes    Comment: 4-5 40oz beers daily  . Drug use: Yes    Frequency: 3.0 times per week    Types: Cocaine     Allergies   Tomato and Aspirin   Review of Systems Review of Systems Ten systems reviewed and are negative for acute change, except as noted in the HPI.    Physical Exam Updated Vital Signs BP 125/87 (BP Location: Right Arm)   Pulse 79   Temp 98.6 F (37 C) (Oral)   Resp 20   SpO2 95%   Physical Exam  Constitutional: He appears well-developed and well-nourished. No distress.  Nontoxic appearing and in NAD  HENT:  Head: Normocephalic and atraumatic.  Eyes: Conjunctivae and EOM are normal. No scleral icterus.  Neck: Normal range of motion.  Pulmonary/Chest: Effort normal. No respiratory distress.  Respirations even and unlabored  Musculoskeletal: Normal range of motion.  Neurological: He is alert. He exhibits normal muscle tone. Coordination normal.  Slurred speech  Skin: Skin is warm and dry. No rash noted. He is not diaphoretic. No erythema. No pallor.  Psychiatric: He has a normal mood and affect. His behavior is normal.  No c/o SI or HI during my encounter  Nursing note and vitals reviewed.    ED Treatments / Results  Labs (all labs ordered are listed, but only abnormal results are displayed) Labs Reviewed  COMPREHENSIVE METABOLIC PANEL - Abnormal; Notable for the following components:      Result Value   Calcium 8.5 (*)    AST 82 (*)    Total Bilirubin 0.2 (*)    All other components within normal limits  ETHANOL - Abnormal; Notable for the following components:   Alcohol, Ethyl (B) 263 (*)    All other components within normal  limits  ACETAMINOPHEN LEVEL - Abnormal; Notable for the following components:   Acetaminophen (Tylenol), Serum <10 (*)    All other components within normal limits  CBC - Abnormal; Notable for the following components:   RBC 3.88 (*)    Hemoglobin 9.4 (*)    HCT 30.7 (*)    MCH 24.2 (*)    RDW 18.2 (*)    All other components within normal limits  SALICYLATE LEVEL  RAPID URINE DRUG SCREEN, HOSP PERFORMED    EKG None  Radiology No results found.  Procedures Procedures (including critical care time)  Medications Ordered in ED Medications  ondansetron (ZOFRAN-ODT) disintegrating tablet 8 mg (8 mg Oral Given 10/07/17 0140)    6:40 AM Patient reassessed.  He is ambulatory in the room and to the bathroom  without assistance.  States that he does not feel well, but cannot articulate what specifically is bothering him.  He states, "I think it is just withdrawal".  No complaints of SI.   Initial Impression / Assessment and Plan / ED Course  I have reviewed the triage vital signs and the nursing notes.  Pertinent labs & imaging results that were available during my care of the patient were reviewed by me and considered in my medical decision making (see chart for details).     52 year old male presents to the emergency department for evaluation.  He has a history of frequent ED visits as well as known alcohol abuse.  Frequent complaints of suicidality which resolved with sobriety.  The patient was last evaluated by behavioral health on 09/29/2017.  He was also seen by case management on this day; offered placement at [facility] where the patient was accepted, but later declined transfer for treatment.  The patient allegedly had suicidal ideations in triage.  He has not expressed any suicidal thoughts during my encounter with him on 2 separate occasions.  His laboratory work-up is at baseline and vital signs have remained stable.  The patient has been allowed to sober in the emergency  department.  He is ambulatory with steady gait without assistance.  Plan for discharge with outpatient primary care follow-up.  Resource guide given.   Final Clinical Impressions(s) / ED Diagnoses   Final diagnoses:  Substance induced mood disorder ([place])  Alcoholic intoxication with complication [facility])    ED Discharge Orders    None       Antonietta Breach, PA-C 10/07/17 [id number]    Drenda Freeze, MD 10/07/17 1501

## 2017-10-07 NOTE — ED Notes (Signed)
Pt discharged home. Discharged instructions read to pt who verbalized understanding. All belongings returned to pt who signed for same. Denies SI/HI, is not delusional and not responding to internal stimuli. Escorted pt to the ED exit.    

## 2017-10-07 NOTE — ED Notes (Signed)
Pt ambulated to bathroom, gait steady

## 2017-10-13 ENCOUNTER — Encounter (HOSPITAL_COMMUNITY): Payer: Self-pay | Admitting: Emergency Medicine

## 2017-10-13 ENCOUNTER — Emergency Department (HOSPITAL_COMMUNITY)
Admission: EM | Admit: 2017-10-13 | Discharge: 2017-10-14 | Disposition: A | Discharge status: Home / Self Care | Payer: Self-pay | Attending: Emergency Medicine | Admitting: Emergency Medicine

## 2017-10-13 DIAGNOSIS — F17210 Nicotine dependence, cigarettes, uncomplicated: Secondary | ICD-10-CM | POA: Insufficient documentation

## 2017-10-13 DIAGNOSIS — F1010 Alcohol abuse, uncomplicated: Secondary | ICD-10-CM

## 2017-10-13 DIAGNOSIS — I10 Essential (primary) hypertension: Secondary | ICD-10-CM | POA: Insufficient documentation

## 2017-10-13 DIAGNOSIS — R569 Unspecified convulsions: Secondary | ICD-10-CM | POA: Insufficient documentation

## 2017-10-13 DIAGNOSIS — F10920 Alcohol use, unspecified with intoxication, uncomplicated: Secondary | ICD-10-CM | POA: Insufficient documentation

## 2017-10-13 LAB — CBG MONITORING, ED: Glucose-Capillary: 100 mg/dL — ABNORMAL HIGH (ref 70–99)

## 2017-10-13 NOTE — ED Notes (Signed)
Bed: WA06 Expected date:  Expected time:  Means of arrival:  Comments: EMS 52 yo male seizure/hx seizures-lower ETOH intake today

## 2017-10-13 NOTE — ED Triage Notes (Signed)
Patient arrives by [facility] by friend because patient had 2 seizures-patient has only had 2-3 40's today and he normally drinks 6-7 40's. Friend could not tell EMS how long the seizures lasted. EMS states patient slept on the ride over. Patient picked up at [facility] parking lot.

## 2017-10-14 ENCOUNTER — Emergency Department (HOSPITAL_COMMUNITY): Payer: Self-pay

## 2017-10-14 LAB — COMPREHENSIVE METABOLIC PANEL
ALBUMIN: 3.6 g/dL (ref 3.5–5.0)
ALK PHOS: 103 U/L (ref 38–126)
ALT: 37 U/L (ref 0–44)
AST: 93 U/L — AB (ref 15–41)
Anion gap: 11 (ref 5–15)
BILIRUBIN TOTAL: 0.4 mg/dL (ref 0.3–1.2)
BUN: 6 mg/dL (ref 6–20)
CALCIUM: 8.6 mg/dL — AB (ref 8.9–10.3)
CO2: 23 mmol/L (ref 22–32)
CREATININE: 0.8 mg/dL (ref 0.61–1.24)
Chloride: 108 mmol/L (ref 98–111)
Glucose, Bld: 100 mg/dL — ABNORMAL HIGH (ref 70–99)
Potassium: 3.8 mmol/L (ref 3.5–5.1)
SODIUM: 142 mmol/L (ref 135–145)
TOTAL PROTEIN: 7.2 g/dL (ref 6.5–8.1)

## 2017-10-14 LAB — CBC
HCT: 30.7 % — ABNORMAL LOW (ref 39.0–52.0)
HEMOGLOBIN: 9.5 g/dL — AB (ref 13.0–17.0)
MCH: 24.1 pg — AB (ref 26.0–34.0)
MCHC: 30.9 g/dL (ref 30.0–36.0)
MCV: 77.7 fL — ABNORMAL LOW (ref 78.0–100.0)
PLATELETS: 203 10*3/uL (ref 150–400)
RBC: 3.95 MIL/uL — AB (ref 4.22–5.81)
RDW: 17.3 % — ABNORMAL HIGH (ref 11.5–15.5)
WBC: 3.8 10*3/uL — AB (ref 4.0–10.5)

## 2017-10-14 LAB — RAPID URINE DRUG SCREEN, HOSP PERFORMED
Amphetamines: NOT DETECTED
Barbiturates: NOT DETECTED
Benzodiazepines: POSITIVE — AB
Cocaine: POSITIVE — AB
OPIATES: NOT DETECTED
Tetrahydrocannabinol: NOT DETECTED

## 2017-10-14 LAB — ETHANOL: ALCOHOL ETHYL (B): 306 mg/dL — AB (ref <10)

## 2017-10-14 MED ORDER — SODIUM CHLORIDE 0.9 % IV BOLUS
1000.0000 mL | Freq: Once | INTRAVENOUS | Status: AC
  Administered 2017-10-14: 1000 mL via INTRAVENOUS

## 2017-10-14 NOTE — Discharge Instructions (Addendum)
Please call the neurologist for follow-up  Please call your primary care physician for follow-up  Please call the resources provided regarding your alcohol abuse

## 2017-10-14 NOTE — ED Notes (Signed)
Pt in ct 

## 2017-10-14 NOTE — ED Provider Notes (Signed)
[facility] DEPT Provider Note   CSN: [id number] Arrival date & time: 10/13/17  2258     History   Chief Complaint Chief Complaint  Patient presents with  . Seizures   Level 5 caveat: Alcohol intoxication  HPI Barry Moore is a 52 y.o. male.  HPI Patient is a 52 year old male with a history of alcohol abuse who reportedly had 2 witnessed seizures today by a friend.  Patient has been drinking alcohol through the day.  EMS reports no seizure-like activity for the patient.  He was picked up in a local parking lot.  No reported recent head injury.   Past Medical History:  Diagnosis Date  . Alcoholic hepatitis   . Depression   . ETOH abuse   . Hypertension   . Pancreatitis   . Seizures ([place])   . Suicidal behavior     Patient Active Problem List   Diagnosis Date Noted  . Substance induced mood disorder ([place]) 07/16/2017  . Severe recurrent major depression without psychotic features ([place]) 05/15/2017  . Anemia 03/18/2017  . Acute blood loss anemia 02/21/2017  . UGI bleed 02/08/2017  . Alcohol withdrawal ([place]) 02/08/2017  . Cocaine abuse ([place]) 11/18/2016  . GI bleed 11/01/2016  . Alcohol abuse   . Major depressive disorder, recurrent severe without psychotic features ([place]) 07/06/2016  . 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 Seizure ([place]) 05/26/2016  . Tobacco abuse 05/26/2016  . Homeless 05/26/2016  . Essential hypertension 05/26/2016  . Alcohol dependence with alcohol-induced mood disorder ([place]) 02/14/2016  . Severe alcohol withdrawal without perceptual disturbances without complication ([place]) 96/28/3662  . Alcoholic hepatitis without ascites 02/14/2016  . Thrombocytopenia ([place]) 02/14/2016    Past Surgical History:  Procedure Laterality Date  . ESOPHAGOGASTRODUODENOSCOPY (EGD) WITH PROPOFOL N/A 11/02/2016   Procedure: ESOPHAGOGASTRODUODENOSCOPY (EGD) WITH PROPOFOL;  Surgeon: Carol Ada, MD;  Location: [facility];   Service: Endoscopy;  Laterality: N/A;  . HERNIA REPAIR    . LEG SURGERY          Home Medications    Prior to Admission medications   Medication Sig Start Date End Date Taking? Authorizing Provider  chlordiazePOXIDE (LIBRIUM) 25 MG capsule 50mg  PO TID x 1D, then 25-50mg  PO BID X 1D, then 25-50mg  PO QD X 1D Patient not taking: Reported on 10/06/2017 09/29/17   Deno Etienne, DO  citalopram (CELEXA) 20 MG tablet Take 1 tablet (20 mg total) by mouth daily. For mood control Patient not taking: Reported on 09/17/2017 07/18/17   Money, Lowry Ram, FNP  famotidine (PEPCID) 20 MG tablet Take 1 tablet (20 mg total) by mouth 2 (two) times daily. Patient not taking: Reported on 09/25/2017 09/12/17   Quintella Reichert, MD  gabapentin (NEURONTIN) 300 MG capsule Take 1 capsule (300 mg total) by mouth 3 (three) times daily. For withdrawal symptoms Patient not taking: Reported on 09/17/2017 07/18/17   Money, Lowry Ram, FNP  naltrexone (DEPADE) 50 MG tablet Take 1 tablet (50 mg total) by mouth daily. For cravings Patient not taking: Reported on 09/17/2017 07/19/17   Money, Lowry Ram, FNP  ondansetron (ZOFRAN ODT) 4 MG disintegrating tablet Take 1 tablet (4 mg total) by mouth every 8 (eight) hours as needed for nausea or vomiting. Patient not taking: Reported on 09/25/2017 09/12/17   Quintella Reichert, MD  sucralfate (CARAFATE) 1 GM/10ML suspension Take 10 mLs (1 g total) by mouth 4 (four) times daily -  with meals and at bedtime. Patient not taking: Reported on 09/17/2017 09/12/17  Quintella Reichert, MD  traZODone (DESYREL) 100 MG tablet Take 1 tablet (100 mg total) by mouth at bedtime. For insomnia Patient not taking: Reported on 09/17/2017 07/18/17   Money, Lowry Ram, FNP    Family History Family History  Problem Relation Age of Onset  . Diabetes Mother   . Alcoholism Mother   . Emphysema Father   . Lung cancer Father   . Alcoholism Father     Social History Social History   Tobacco Use  . Smoking status: Current  Every Day Smoker    Packs/day: 1.00    Types: Cigarettes  . Smokeless tobacco: Never Used  Substance Use Topics  . Alcohol use: Yes    Comment: 4-5 40oz beers daily  . Drug use: Yes    Frequency: 3.0 times per week    Types: Cocaine     Allergies   Tomato and Aspirin   Review of Systems Review of Systems  Unable to perform ROS: Mental status change     Physical Exam Updated Vital Signs BP (!) 145/83 (BP Location: Left Arm)   Pulse 76   Temp 97.8 F (36.6 C) (Oral)   Resp 18   SpO2 99%   Physical Exam  Constitutional: He appears well-developed and well-nourished.  HENT:  Head: Normocephalic and atraumatic.  Eyes: Pupils are equal, round, and reactive to light. EOM are normal.  Neck: Normal range of motion.  Cardiovascular: Normal rate and regular rhythm.  Pulmonary/Chest: Effort normal and breath sounds normal. No respiratory distress.  Abdominal: Soft. He exhibits no distension. There is no tenderness.  Musculoskeletal: Normal range of motion.  Neurological:  Opens eyes to voice and pain.  Follow simple commands.  Moves all 4 extremities equally.  No facial asymmetry  Skin: Skin is warm and dry.  Psychiatric:  Unable to test  Nursing note and vitals reviewed.    ED Treatments / Results  Labs (all labs ordered are listed, but only abnormal results are displayed) Labs Reviewed  CBC - Abnormal; Notable for the following components:      Result Value   WBC 3.8 (*)    RBC 3.95 (*)    Hemoglobin 9.5 (*)    HCT 30.7 (*)    MCV 77.7 (*)    MCH 24.1 (*)    RDW 17.3 (*)    All other components within normal limits  COMPREHENSIVE METABOLIC PANEL - Abnormal; Notable for the following components:   Glucose, Bld 100 (*)    Calcium 8.6 (*)    AST 93 (*)    All other components within normal limits  ETHANOL - Abnormal; Notable for the following components:   Alcohol, Ethyl (B) 306 (*)    All other components within normal limits  RAPID URINE DRUG SCREEN, HOSP  PERFORMED - Abnormal; Notable for the following components:   Cocaine POSITIVE (*)    Benzodiazepines POSITIVE (*)    All other components within normal limits  CBG MONITORING, ED - Abnormal; Notable for the following components:   Glucose-Capillary 100 (*)    All other components within normal limits    EKG None  Radiology Ct Head Wo Contrast  Result Date: 10/14/2017 CLINICAL DATA:  Two seizures after drinking. EXAM: CT HEAD WITHOUT CONTRAST TECHNIQUE: Contiguous axial images were obtained from the base of the skull through the vertex without intravenous contrast. COMPARISON:  09/29/2017 FINDINGS: Brain: Mild cerebral atrophy. No ventricular dilatation. No mass effect or midline shift. No abnormal extra-axial fluid collections. Gray-white matter  junctions are distinct. Basal cisterns are not effaced. No acute intracranial hemorrhage. Vascular: No hyperdense vessel or unexpected calcification. Skull: Normal. Negative for fracture or focal lesion. Sinuses/Orbits: No acute finding. Other: None. IMPRESSION: No acute intracranial abnormality. Electronically Signed   By: Lucienne Capers M.D.   On: 10/14/2017 01:36    Procedures Procedures (including critical care time)  Medications Ordered in ED Medications  sodium chloride 0.9 % bolus 1,000 mL (1,000 mLs Intravenous New Bag/Given 10/14/17 0357)     Initial Impression / Assessment and Plan / ED Course  I have reviewed the triage vital signs and the nursing notes.  Pertinent labs & imaging results that were available during my care of the patient were reviewed by me and considered in my medical decision making (see chart for details).     Patient observed in the emergency department.  No seizure activity noted here in the ER.  No tongue biting or urinary incontinence occurred today.  Polysubstance abuse.  Positive drug screen.  Alcohol 306.  Patient observed in the emergency department and seems to be doing better.  Clinically sober.   Discharged home in good condition.  Outpatient resources given for alcohol abuse.  He will need to see neurology as an outpatient.  Standard seizure precautions given.  Primary care follow-up.  Patient understands return to the ER for new or worsening symptoms.  He is encouraged to work with the team regarding his ongoing alcohol abuse.  Final Clinical Impressions(s) / ED Diagnoses   Final diagnoses:  Seizure-like activity ([place])  Alcoholic intoxication without complication [facility])  Alcohol abuse    ED Discharge Orders    None       Jola Schmidt, MD 10/14/17 [phone]

## 2017-10-20 ENCOUNTER — Encounter (HOSPITAL_COMMUNITY): Payer: Self-pay | Admitting: Emergency Medicine

## 2017-10-20 ENCOUNTER — Other Ambulatory Visit: Payer: Self-pay

## 2017-10-20 ENCOUNTER — Emergency Department (HOSPITAL_COMMUNITY)
Admission: EM | Admit: 2017-10-20 | Discharge: 2017-10-20 | Disposition: A | Discharge status: Home / Self Care | Payer: Self-pay | Attending: Emergency Medicine | Admitting: Emergency Medicine

## 2017-10-20 ENCOUNTER — Emergency Department (HOSPITAL_COMMUNITY): Payer: Self-pay

## 2017-10-20 DIAGNOSIS — M545 Low back pain, unspecified: Secondary | ICD-10-CM

## 2017-10-20 DIAGNOSIS — F17210 Nicotine dependence, cigarettes, uncomplicated: Secondary | ICD-10-CM | POA: Insufficient documentation

## 2017-10-20 DIAGNOSIS — F10920 Alcohol use, unspecified with intoxication, uncomplicated: Secondary | ICD-10-CM

## 2017-10-20 DIAGNOSIS — I10 Essential (primary) hypertension: Secondary | ICD-10-CM | POA: Insufficient documentation

## 2017-10-20 DIAGNOSIS — F10220 Alcohol dependence with intoxication, uncomplicated: Secondary | ICD-10-CM | POA: Insufficient documentation

## 2017-10-20 MED ORDER — NAPROXEN 500 MG PO TABS
500.0000 mg | ORAL_TABLET | Freq: Once | ORAL | Status: AC
  Administered 2017-10-20: 500 mg via ORAL
  Filled 2017-10-20: qty 1

## 2017-10-20 NOTE — ED Provider Notes (Signed)
[facility] DEPT Provider Note   CSN: [id number] Arrival date & time: 10/20/17  0144     History   Chief Complaint Chief Complaint  Patient presents with  . Alcohol Intoxication    HPI Barry Moore is a 52 y.o. male.  HPI  Barry Moore is a 52 year old male with a history of alcohol abuse, alcoholic hepatitis, pancreatitis and withdrawal seizures who presents to the emergency department via EMS for evaluation after being found in the [place] parking lot intoxicated and nauseated.  Patient was in the waiting room for 6 hours prior to my evaluation.  He states that he "drinks too much."  He has tried several alcohol treatment programs, none of which have worked for him.  He is not interested today in alcohol abuse treatment.  He reports that he cannot remember much from last night.  States that he has had lower back pain for the past week now intermittently, believes that he fell earlier this week from intoxication.  Reports that pain is in the middle of his lower back and radiates toward the left side. Back pain is aching and worsened with movement.  He does not take any over-the-counter medications for pain.  States that he has a mild headache.  He denies known seizure activity last night, denies urinary or fecal incontinence.  He denies new numbness or weakness, fever, chills, headache, nausea/vomiting, abdominal pain, chest pain, shortness of breath.  Past Medical History:  Diagnosis Date  . Alcoholic hepatitis   . Depression   . ETOH abuse   . Hypertension   . Pancreatitis   . Seizures ([place])   . Suicidal behavior     Patient Active Problem List   Diagnosis Date Noted  . Substance induced mood disorder ([place]) 07/16/2017  . Severe recurrent major depression without psychotic features ([place]) 05/15/2017  . Anemia 03/18/2017  . Acute blood loss anemia 02/21/2017  . UGI bleed 02/08/2017  . Alcohol withdrawal ([place]) 02/08/2017  . Cocaine abuse ([place])  11/18/2016  . GI bleed 11/01/2016  . Alcohol abuse   . Major depressive disorder, recurrent severe without psychotic features ([place]) 07/06/2016  . 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 Seizure ([place]) 05/26/2016  . Tobacco abuse 05/26/2016  . Homeless 05/26/2016  . Essential hypertension 05/26/2016  . Alcohol dependence with alcohol-induced mood disorder ([place]) 02/14/2016  . Severe alcohol withdrawal without perceptual disturbances without complication ([place]) 74/94/4967  . Alcoholic hepatitis without ascites 02/14/2016  . Thrombocytopenia ([place]) 02/14/2016    Past Surgical History:  Procedure Laterality Date  . ESOPHAGOGASTRODUODENOSCOPY (EGD) WITH PROPOFOL N/A 11/02/2016   Procedure: ESOPHAGOGASTRODUODENOSCOPY (EGD) WITH PROPOFOL;  Surgeon: Carol Ada, MD;  Location[facility];  Service: Endoscopy;  Laterality: N/A;  . HERNIA REPAIR    . LEG SURGERY          Home Medications    Prior to Admission medications   Medication Sig Start Date End Date Taking? Authorizing Provider  chlordiazePOXIDE (LIBRIUM) 25 MG capsule 50mg  PO TID x 1D, then 25-50mg  PO BID X 1D, then 25-50mg  PO QD X 1D Patient not taking: Reported on 10/06/2017 09/29/17   Deno Etienne, DO  citalopram (CELEXA) 20 MG tablet Take 1 tablet (20 mg total) by mouth daily. For mood control Patient not taking: Reported on 09/17/2017 07/18/17   Money, Lowry Ram, FNP  famotidine (PEPCID) 20 MG tablet Take 1 tablet (20 mg total) by mouth 2 (two) times daily. Patient not taking: Reported on 09/25/2017 09/12/17  Quintella Reichert, MD  gabapentin (NEURONTIN) 300 MG capsule Take 1 capsule (300 mg total) by mouth 3 (three) times daily. For withdrawal symptoms Patient not taking: Reported on 09/17/2017 07/18/17   Money, Lowry Ram, FNP  naltrexone (DEPADE) 50 MG tablet Take 1 tablet (50 mg total) by mouth daily. For cravings Patient not taking: Reported on 09/17/2017 07/19/17   Money, Lowry Ram, FNP  ondansetron (ZOFRAN ODT) 4 MG  disintegrating tablet Take 1 tablet (4 mg total) by mouth every 8 (eight) hours as needed for nausea or vomiting. Patient not taking: Reported on 09/25/2017 09/12/17   Quintella Reichert, MD  sucralfate (CARAFATE) 1 GM/10ML suspension Take 10 mLs (1 g total) by mouth 4 (four) times daily -  with meals and at bedtime. Patient not taking: Reported on 09/17/2017 09/12/17   Quintella Reichert, MD  traZODone (DESYREL) 100 MG tablet Take 1 tablet (100 mg total) by mouth at bedtime. For insomnia Patient not taking: Reported on 09/17/2017 07/18/17   Money, Lowry Ram, FNP    Family History Family History  Problem Relation Age of Onset  . Diabetes Mother   . Alcoholism Mother   . Emphysema Father   . Lung cancer Father   . Alcoholism Father     Social History Social History   Tobacco Use  . Smoking status: Current Every Day Smoker    Packs/day: 1.00    Types: Cigarettes  . Smokeless tobacco: Never Used  Substance Use Topics  . Alcohol use: Yes    Comment: 4-5 40oz beers daily  . Drug use: Yes    Frequency: 3.0 times per week    Types: Cocaine     Allergies   Tomato and Aspirin   Review of Systems Review of Systems  Constitutional: Negative for chills and fever.  Respiratory: Negative for shortness of breath.   Cardiovascular: Negative for chest pain.  Gastrointestinal: Negative for abdominal pain.  Genitourinary: Negative for difficulty urinating, dysuria and frequency.  Musculoskeletal: Positive for back pain (lower back).  Skin: Negative for wound.  Neurological: Negative for seizures, weakness, numbness and headaches.  Psychiatric/Behavioral: Negative for agitation and confusion.     Physical Exam Updated Vital Signs BP 117/75 (BP Location: Left Arm)   Pulse 87   Temp 98.1 F (36.7 C) (Oral)   Resp 14   SpO2 98%   Physical Exam  Constitutional: He is oriented to person, place, and time. He appears well-developed and well-nourished. No distress.  Wakes to verbal stimuli.   Alert and oriented x3.  HENT:  Head: Normocephalic and atraumatic.  Mouth/Throat: Oropharynx is clear and moist.  No evidence of head trauma. No racoon eyes or battle sign. No rhinorrhea.   Eyes: Pupils are equal, round, and reactive to light. Conjunctivae and EOM are normal. Right eye exhibits no discharge. Left eye exhibits no discharge.  Neck: Normal range of motion.  Cardiovascular: Normal rate and regular rhythm.  Pulmonary/Chest: Effort normal and breath sounds normal. No stridor. No respiratory distress. He has no wheezes. He has no rales.  Abdominal: Soft. Bowel sounds are normal.  Musculoskeletal: Normal range of motion.  Tender to palpation over several spinous processes of the lumbar spine as well as left-sided paraspinal muscles of the lumbar spine.  No SI joint tenderness.  Bilateral upper and lower extremities with 5/5 strength including grip strength and ankle dorsiflexion/plantarflexion.  DP pulses 1+ and symmetric bilaterally.  Neurological: He is alert and oriented to person, place, and time. Coordination normal.  Sensation to  light touch intact in bilateral lower extremities.  Gait normal in coordination and balance.   Skin: Skin is warm and dry. Capillary refill takes less than 2 seconds. He is not diaphoretic.  Psychiatric: He has a normal mood and affect. His behavior is normal.  Nursing note and vitals reviewed.    ED Treatments / Results  Labs (all labs ordered are listed, but only abnormal results are displayed) Labs Reviewed - No data to display  EKG None  Radiology Dg Lumbar Spine Complete  Result Date: 10/20/2017 CLINICAL DATA:  52 year old male status post fall several days ago with continued pain radiating to the right hip, leg and foot. EXAM: LUMBAR SPINE - COMPLETE 4+ VIEW COMPARISON:  Lumbar radiographs 09/25/2017. CT Abdomen and Pelvis 05/06/2017, and earlier. FINDINGS: Normal lumbar segmentation. Continued stable vertebral height and alignment. Mild  chronic straightening of lumbar lordosis. Mild chronic retrolisthesis of L5 on S1 with disc space loss and endplate spurring at that level. Vacuum disc is demonstrated at L5-S1 on the February CT. Other disc spaces are preserved. The no pars fracture. Mild lower lumbar facet hypertrophy. Sacral ala and SI joints appear stable and intact. No acute osseous abnormality identified. Negative visible bowel gas pattern. Mild Calcified aortic atherosclerosis. IMPRESSION: 1.  No acute osseous abnormality identified in lumbar spine. 2. Advanced L5-S1 disc degeneration. Electronically Signed   By: Genevie Ann M.D.   On: 10/20/2017 08:43    Procedures Procedures (including critical care time)  Medications Ordered in ED Medications  naproxen (NAPROSYN) tablet 500 mg (500 mg Oral Given 10/20/17 0757)     Initial Impression / Assessment and Plan / ED Course  I have reviewed the triage vital signs and the nursing notes.  Pertinent labs & imaging results that were available during my care of the patient were reviewed by me and considered in my medical decision making (see chart for details).     Patient presents to the ED via EMS for alcohol intoxication. Has his history of this in the past with frequent ED visits. He was monitored in the ED for 7hrs and is now clinically sober. No seizure activity, loss of bowel or bladder control. Is complaining of some lower back pain which is intermittent. Lumbar spine xray reveals disk degeneration, no acute osseous abnormality. He is able to ambulate independently, no loss of bowel or bladder control and no neurological deficits. I am not  Concerned for cauda equina. No fever or history of IVDU and I do not suspect diskitis, epidural abscess or osteomyelitis of the spine requiring further imaging. Patient has been given instructions to take NSAIDs like ibuprofen or naproxen for pain. Have advised him not to take tylenol given his history of alcoholism. He was treated with naproxen  today and reports improvement. Patient is not interested in treatment for alcohol abuse. I have given him information for this in his discharge paperwork if he changes his mind. Have counseled him on reasons to return to the Emergency Department and he agrees.   Final Clinical Impressions(s) / ED Diagnoses   Final diagnoses:  Alcoholic intoxication without complication ([place])  Acute midline low back pain without sciatica    ED Discharge Orders    None       Bernarda Caffey 10/20/17 1093    Lajean Saver, MD 10/20/17 1205

## 2017-10-20 NOTE — Discharge Instructions (Signed)
Take ibuprofen as needed for lower back pain.   I have listed resources with this paperwork if you are interested in alcohol abuse treatment.   Return to the ER if you have any new or concerning symptoms.

## 2017-10-20 NOTE — ED Notes (Signed)
Bed: [facility] Expected date:  Expected time:  Means of arrival:  Comments: 

## 2017-10-20 NOTE — ED Triage Notes (Signed)
Pt arriving with [facility] for intoxication and nausea from [place] parking lot.

## 2017-10-29 ENCOUNTER — Encounter (HOSPITAL_COMMUNITY): Payer: Self-pay | Admitting: Internal Medicine

## 2017-10-29 ENCOUNTER — Emergency Department (HOSPITAL_COMMUNITY)
Admission: EM | Admit: 2017-10-29 | Discharge: 2017-10-29 | Disposition: A | Discharge status: Home / Self Care | Payer: Self-pay | Attending: Emergency Medicine | Admitting: Emergency Medicine

## 2017-10-29 DIAGNOSIS — I10 Essential (primary) hypertension: Secondary | ICD-10-CM | POA: Insufficient documentation

## 2017-10-29 DIAGNOSIS — F17210 Nicotine dependence, cigarettes, uncomplicated: Secondary | ICD-10-CM | POA: Insufficient documentation

## 2017-10-29 DIAGNOSIS — D696 Thrombocytopenia, unspecified: Secondary | ICD-10-CM

## 2017-10-29 DIAGNOSIS — Z590 Homelessness: Secondary | ICD-10-CM | POA: Insufficient documentation

## 2017-10-29 DIAGNOSIS — F10120 Alcohol abuse with intoxication, uncomplicated: Secondary | ICD-10-CM | POA: Insufficient documentation

## 2017-10-29 DIAGNOSIS — F1010 Alcohol abuse, uncomplicated: Secondary | ICD-10-CM

## 2017-10-29 DIAGNOSIS — E876 Hypokalemia: Secondary | ICD-10-CM

## 2017-10-29 DIAGNOSIS — Z79899 Other long term (current) drug therapy: Secondary | ICD-10-CM | POA: Insufficient documentation

## 2017-10-29 LAB — COMPREHENSIVE METABOLIC PANEL
ALBUMIN: 3.7 g/dL (ref 3.5–5.0)
ALT: 36 U/L (ref 0–44)
ANION GAP: 11 (ref 5–15)
AST: 124 U/L — AB (ref 15–41)
Alkaline Phosphatase: 102 U/L (ref 38–126)
CHLORIDE: 105 mmol/L (ref 98–111)
CO2: 22 mmol/L (ref 22–32)
Calcium: 8.7 mg/dL — ABNORMAL LOW (ref 8.9–10.3)
Creatinine, Ser: 0.8 mg/dL (ref 0.61–1.24)
GFR calc Af Amer: >60 mL/min (ref >60)
GFR calc non Af Amer: >60 mL/min (ref >60)
GLUCOSE: 98 mg/dL (ref 70–99)
POTASSIUM: 3.4 mmol/L — AB (ref 3.5–5.1)
Sodium: 138 mmol/L (ref 135–145)
Total Bilirubin: 0.7 mg/dL (ref 0.3–1.2)
Total Protein: 6.9 g/dL (ref 6.5–8.1)

## 2017-10-29 LAB — CBC
HEMATOCRIT: 32.8 % — AB (ref 39.0–52.0)
Hemoglobin: 9.8 g/dL — ABNORMAL LOW (ref 13.0–17.0)
MCH: 23.5 pg — ABNORMAL LOW (ref 26.0–34.0)
MCHC: 29.9 g/dL — AB (ref 30.0–36.0)
MCV: 78.7 fL (ref 78.0–100.0)
Platelets: 110 10*3/uL — ABNORMAL LOW (ref 150–400)
RBC: 4.17 MIL/uL — ABNORMAL LOW (ref 4.22–5.81)
RDW: 17.2 % — AB (ref 11.5–15.5)
WBC: 3.4 10*3/uL — AB (ref 4.0–10.5)

## 2017-10-29 LAB — LIPASE, BLOOD: Lipase: 55 U/L — ABNORMAL HIGH (ref 11–51)

## 2017-10-29 LAB — ETHANOL: Alcohol, Ethyl (B): 317 mg/dL (ref <10)

## 2017-10-29 MED ORDER — POTASSIUM CHLORIDE CRYS ER 20 MEQ PO TBCR
40.0000 meq | EXTENDED_RELEASE_TABLET | Freq: Once | ORAL | Status: AC
  Administered 2017-10-29: 40 meq via ORAL
  Filled 2017-10-29: qty 2

## 2017-10-29 MED ORDER — ACETAMINOPHEN 325 MG PO TABS
650.0000 mg | ORAL_TABLET | Freq: Once | ORAL | Status: AC
  Administered 2017-10-29: 650 mg via ORAL
  Filled 2017-10-29: qty 2

## 2017-10-29 MED ORDER — SODIUM CHLORIDE 0.9 % IV BOLUS
500.0000 mL | Freq: Once | INTRAVENOUS | Status: AC
  Administered 2017-10-29: 500 mL via INTRAVENOUS

## 2017-10-29 NOTE — Discharge Instructions (Addendum)
It was our pleasure to provide your ER care today - we hope that you feel better.  Avoid drinking alcohol. Follow up with AA, and use resource guide provided for additional community resources.   From today's lab tests, your potassium level is slightly low (3.4) - eat plenty of fruits and vegetables.

## 2017-10-29 NOTE — ED Notes (Signed)
Pt given food and beverage 

## 2017-10-29 NOTE — ED Notes (Signed)
Offered pt fluids (water) per ED provider, pt took several sips, cup within reach at bedside

## 2017-10-29 NOTE — ED Provider Notes (Signed)
[facility] EMERGENCY DEPARTMENT Provider Note   CSN: [id number] Arrival date & time: 10/29/17  1334     History   Chief Complaint Chief Complaint  Patient presents with  . Tremors  . Alcohol Intoxication    HPI Barry Moore is a 52 y.o. male.  Patient with hx etoh abuse, presents from parking lot near homeless camp where by was noted to appear shaky. No loc noted. No postictal period described. Ems denies witnessing any seizure. Pt c/o epigastric pain, mild, dull, non radiating. No vomiting or diarrhea. No chest pain or discomfort. Denies headache. No neck or back pain. Denies trauma or fall. Pt is unable to quantify amount of etoh use today.   The history is provided by the patient and the EMS personnel.  Alcohol Intoxication  Pertinent negatives include no chest pain, no headaches and no shortness of breath.    Past Medical History:  Diagnosis Date  . Alcoholic hepatitis   . Depression   . ETOH abuse   . Hypertension   . Pancreatitis   . Seizures ([place])   . Suicidal behavior     Patient Active Problem List   Diagnosis Date Noted  . Substance induced mood disorder ([place]) 07/16/2017  . Severe recurrent major depression without psychotic features ([place]) 05/15/2017  . Anemia 03/18/2017  . Acute blood loss anemia 02/21/2017  . UGI bleed 02/08/2017  . Alcohol withdrawal ([place]) 02/08/2017  . Cocaine abuse ([place]) 11/18/2016  . GI bleed 11/01/2016  . Alcohol abuse   . Major depressive disorder, recurrent severe without psychotic features ([place]) 07/06/2016  . 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 Seizure ([place]) 05/26/2016  . Tobacco abuse 05/26/2016  . Homeless 05/26/2016  . Essential hypertension 05/26/2016  . Alcohol dependence with alcohol-induced mood disorder ([place]) 02/14/2016  . Severe alcohol withdrawal without perceptual disturbances without complication ([place]) [birth year]1941  . Alcoholic hepatitis without ascites 02/14/2016  .  Thrombocytopenia ([place]) 02/14/2016    Past Surgical History:  Procedure Laterality Date  . ESOPHAGOGASTRODUODENOSCOPY (EGD) WITH PROPOFOL N/A 11/02/2016   Procedure: ESOPHAGOGASTRODUODENOSCOPY (EGD) WITH PROPOFOL;  Surgeon: Carol Ada, MD;  Location: [facility];  Service: Endoscopy;  Laterality: N/A;  . HERNIA REPAIR    . LEG SURGERY          Home Medications    Prior to Admission medications   Medication Sig Start Date End Date Taking? Authorizing Provider  chlordiazePOXIDE (LIBRIUM) 25 MG capsule 50mg  PO TID x 1D, then 25-50mg  PO BID X 1D, then 25-50mg  PO QD X 1D Patient not taking: Reported on 10/06/2017 09/29/17   Deno Etienne, DO  citalopram (CELEXA) 20 MG tablet Take 1 tablet (20 mg total) by mouth daily. For mood control Patient not taking: Reported on 09/17/2017 07/18/17   Money, Lowry Ram, FNP  famotidine (PEPCID) 20 MG tablet Take 1 tablet (20 mg total) by mouth 2 (two) times daily. Patient not taking: Reported on 09/25/2017 09/12/17   Quintella Reichert, MD  gabapentin (NEURONTIN) 300 MG capsule Take 1 capsule (300 mg total) by mouth 3 (three) times daily. For withdrawal symptoms Patient not taking: Reported on 09/17/2017 07/18/17   Money, Lowry Ram, FNP  naltrexone (DEPADE) 50 MG tablet Take 1 tablet (50 mg total) by mouth daily. For cravings Patient not taking: Reported on 09/17/2017 07/19/17   Money, Lowry Ram, FNP  ondansetron (ZOFRAN ODT) 4 MG disintegrating tablet Take 1 tablet (4 mg total) by mouth every 8 (eight) hours as needed for nausea or vomiting.  Patient not taking: Reported on 09/25/2017 09/12/17   Quintella Reichert, MD  sucralfate (CARAFATE) 1 GM/10ML suspension Take 10 mLs (1 g total) by mouth 4 (four) times daily -  with meals and at bedtime. Patient not taking: Reported on 09/17/2017 09/12/17   Quintella Reichert, MD  traZODone (DESYREL) 100 MG tablet Take 1 tablet (100 mg total) by mouth at bedtime. For insomnia Patient not taking: Reported on 09/17/2017 07/18/17   Money, Lowry Ram, FNP    Family History Family History  Problem Relation Age of Onset  . Diabetes Mother   . Alcoholism Mother   . Emphysema Father   . Lung cancer Father   . Alcoholism Father     Social History Social History   Tobacco Use  . Smoking status: Current Every Day Smoker    Packs/day: 1.00    Types: Cigarettes  . Smokeless tobacco: Never Used  Substance Use Topics  . Alcohol use: Yes    Comment: 4-5 40oz beers daily  . Drug use: Yes    Frequency: 3.0 times per week    Types: Cocaine     Allergies   Tomato and Aspirin   Review of Systems Review of Systems  Constitutional: Negative for fever.  HENT: Negative for sore throat.   Eyes: Negative for redness.  Respiratory: Negative for shortness of breath.   Cardiovascular: Negative for chest pain.  Gastrointestinal: Negative for vomiting.  Genitourinary: Negative for flank pain.  Musculoskeletal: Negative for back pain and neck pain.  Skin: Negative for rash.  Neurological: Negative for headaches.  Hematological: Does not bruise/bleed easily.  Psychiatric/Behavioral: Negative for confusion.     Physical Exam Updated Vital Signs BP 139/84   Pulse 86   Temp 99 F (37.2 C)   Resp (!) 26   Ht 1.702 m (5\' 7" )   Wt 83.9 kg   SpO2 95%   BMI 28.98 kg/m   Physical Exam  Constitutional: He appears well-developed and well-nourished.  HENT:  Head: Atraumatic.  Mouth/Throat: Oropharynx is clear and moist.  Eyes: Pupils are equal, round, and reactive to light. Conjunctivae are normal.  Neck: Normal range of motion. Neck supple. No tracheal deviation present.  Cardiovascular: Normal rate, regular rhythm, normal heart sounds and intact distal pulses. Exam reveals no gallop and no friction rub.  No murmur heard. Pulmonary/Chest: Effort normal and breath sounds normal. No accessory muscle usage. No respiratory distress.  Abdominal: Soft. Bowel sounds are normal. He exhibits no distension.  Musculoskeletal: He exhibits  no edema.  CTLS spine, non tender, aligned, no step off. Good rom bil extremities, no focal bony tenderness.   Neurological: He is alert.  Skin: Skin is warm and dry. No rash noted.  Psychiatric:  Denies SI/HI. Alert, ?intoxicated.   Nursing note and vitals reviewed.    ED Treatments / Results  Labs (all labs ordered are listed, but only abnormal results are displayed) Results for orders placed or performed during the hospital encounter of 10/29/17  CBC  Result Value Ref Range   WBC 3.4 (L) 4.0 - 10.5 K/uL   RBC 4.17 (L) 4.22 - 5.81 MIL/uL   Hemoglobin 9.8 (L) 13.0 - 17.0 g/dL   HCT 32.8 (L) 39.0 - 52.0 %   MCV 78.7 78.0 - 100.0 fL   MCH 23.5 (L) 26.0 - 34.0 pg   MCHC 29.9 (L) 30.0 - 36.0 g/dL   RDW 17.2 (H) 11.5 - 15.5 %   Platelets 110 (L) 150 - 400 K/uL  Comprehensive metabolic panel  Result Value Ref Range   Sodium 138 135 - 145 mmol/L   Potassium 3.4 (L) 3.5 - 5.1 mmol/L   Chloride 105 98 - 111 mmol/L   CO2 22 22 - 32 mmol/L   Glucose, Bld 98 70 - 99 mg/dL   BUN <5 (L) 6 - 20 mg/dL   Creatinine, Ser 0.80 0.61 - 1.24 mg/dL   Calcium 8.7 (L) 8.9 - 10.3 mg/dL   Total Protein 6.9 6.5 - 8.1 g/dL   Albumin 3.7 3.5 - 5.0 g/dL   AST 124 (H) 15 - 41 U/L   ALT 36 0 - 44 U/L   Alkaline Phosphatase 102 38 - 126 U/L   Total Bilirubin 0.7 0.3 - 1.2 mg/dL   GFR calc non Af Amer >60 >60 mL/min   GFR calc Af Amer >60 >60 mL/min   Anion gap 11 5 - 15  Lipase, blood  Result Value Ref Range   Lipase 55 (H) 11 - 51 U/L   Dg Lumbar Spine Complete  Result Date: 10/20/2017 CLINICAL DATA:  52 year old male status post fall several days ago with continued pain radiating to the right hip, leg and foot. EXAM: LUMBAR SPINE - COMPLETE 4+ VIEW COMPARISON:  Lumbar radiographs 09/25/2017. CT Abdomen and Pelvis 05/06/2017, and earlier. FINDINGS: Normal lumbar segmentation. Continued stable vertebral height and alignment. Mild chronic straightening of lumbar lordosis. Mild chronic retrolisthesis  of L5 on S1 with disc space loss and endplate spurring at that level. Vacuum disc is demonstrated at L5-S1 on the February CT. Other disc spaces are preserved. The no pars fracture. Mild lower lumbar facet hypertrophy. Sacral ala and SI joints appear stable and intact. No acute osseous abnormality identified. Negative visible bowel gas pattern. Mild Calcified aortic atherosclerosis. IMPRESSION: 1.  No acute osseous abnormality identified in lumbar spine. 2. Advanced L5-S1 disc degeneration. Electronically Signed   By: Genevie Ann M.D.   On: 10/20/2017 08:43   Ct Head Wo Contrast  Result Date: 10/14/2017 CLINICAL DATA:  Two seizures after drinking. EXAM: CT HEAD WITHOUT CONTRAST TECHNIQUE: Contiguous axial images were obtained from the base of the skull through the vertex without intravenous contrast. COMPARISON:  09/29/2017 FINDINGS: Brain: Mild cerebral atrophy. No ventricular dilatation. No mass effect or midline shift. No abnormal extra-axial fluid collections. Gray-white matter junctions are distinct. Basal cisterns are not effaced. No acute intracranial hemorrhage. Vascular: No hyperdense vessel or unexpected calcification. Skull: Normal. Negative for fracture or focal lesion. Sinuses/Orbits: No acute finding. Other: None. IMPRESSION: No acute intracranial abnormality. Electronically Signed   By: Lucienne Capers M.D.   On: 10/14/2017 01:36   Ct Head Wo Contrast  Result Date: 09/29/2017 CLINICAL DATA:  Head trauma.  Now with ataxia. EXAM: CT HEAD WITHOUT CONTRAST TECHNIQUE: Contiguous axial images were obtained from the base of the skull through the vertex without intravenous contrast. COMPARISON:  08/13/2017 FINDINGS: Brain: There is no evidence for acute hemorrhage, hydrocephalus, mass lesion, or abnormal extra-axial fluid collection. No definite CT evidence for acute infarction. Diffuse loss of parenchymal volume is consistent with atrophy. Vascular: No hyperdense vessel or unexpected calcification.  Skull: No evidence for fracture. No worrisome lytic or sclerotic lesion. Sinuses/Orbits: The visualized paranasal sinuses and mastoid air cells are clear. Visualized portions of the globes and intraorbital fat are unremarkable. Other: None. IMPRESSION: 1. No acute intracranial abnormality with stable appearance of diffuse atrophy. Electronically Signed   By: Misty [place] M.D.   On: 09/29/2017 20:03  EKG None  Radiology No results found.  Procedures Procedures (including critical care time)  Medications Ordered in ED Medications  sodium chloride 0.9 % bolus 500 mL (has no administration in time range)     Initial Impression / Assessment and Plan / ED Course  I have reviewed the triage vital signs and the nursing notes.  Pertinent labs & imaging results that were available during my care of the patient were reviewed by me and considered in my medical decision making (see chart for details).  Iv ns. Labs sent. Monitor. Pulse ox.   Reviewed nursing notes and prior charts for additional history.   Labs reviewed - k slightly low. kcl po.   Po fluids, food.   Tolerating po.  Recheck abd soft nt. No vomiting.   1530, etoh pending. Signed out to Dr Sedonia Small to check labs when resulted, reassess, dispo accordingly.     Final Clinical Impressions(s) / ED Diagnoses   Final diagnoses:  None    ED Discharge Orders    None       Lajean Saver, MD 10/29/17 1529

## 2017-10-29 NOTE — ED Triage Notes (Signed)
Pt here via [facility] after drinking 3 40s this morning. EMS was called by his peers at the homeless [place] he stays d/t reports of the patient having "seizures." EMS states no observed seizure like activity or post ictal state. Pt c/o nausea, vomiting, and diarrhea x1 day. Vital signs stable at this time. Bed locked and low. Seizure pads initiated and maintained. Pt in position of comfort.

## 2017-10-29 NOTE — ED Notes (Signed)
MD called lab to inquire of whereabouts of ETOH results; per lab, they did not receive the tube; this RN drew another ETOH and sent it down, called lab to notify; lab received the ETOH order, and will be on the lookout for the new tube

## 2017-10-29 NOTE — ED Notes (Signed)
MD notified of ETOH level

## 2017-10-29 NOTE — ED Provider Notes (Signed)
  I received sign out for this patient at shift change from Dr. Ashok Cordia.  Clinical Course as of Oct 29 1804  Sat Oct 29, 2017  1541 Homeless, h/o withdrawal seizures, found tremulous in a parking lot.  Fu ethanol, and reassess.  [MB]  1804 Ethanol level 317.  Patient allowed time to metabolize alcohol in the ED.  Upon reevaluation, patient is clinically sober and ready for discharge.  No evidence of active withdrawal during time of discharge.After the discussed management above, the patient was determined to be safe for discharge.  The patient was in agreement with this plan and all questions regarding their care were answered.  ED return precautions were discussed and the patient will return to the ED with any significant worsening of condition.   [MB]    Clinical Course User Index [MB] Sedonia Small Barth Kirks, MD    Barth Kirks. Sedonia Small, [place] [email or web address]     [name], MD 10/29/17 [phone]

## 2017-11-06 ENCOUNTER — Emergency Department (HOSPITAL_COMMUNITY)
Admission: EM | Admit: 2017-11-06 | Discharge: 2017-11-07 | Disposition: A | Discharge status: Home / Self Care | Payer: Self-pay | Attending: Emergency Medicine | Admitting: Emergency Medicine

## 2017-11-06 ENCOUNTER — Emergency Department (HOSPITAL_COMMUNITY): Payer: Self-pay

## 2017-11-06 ENCOUNTER — Encounter (HOSPITAL_COMMUNITY): Payer: Self-pay

## 2017-11-06 DIAGNOSIS — F17210 Nicotine dependence, cigarettes, uncomplicated: Secondary | ICD-10-CM | POA: Insufficient documentation

## 2017-11-06 DIAGNOSIS — F329 Major depressive disorder, single episode, unspecified: Secondary | ICD-10-CM | POA: Insufficient documentation

## 2017-11-06 DIAGNOSIS — F1010 Alcohol abuse, uncomplicated: Secondary | ICD-10-CM

## 2017-11-06 DIAGNOSIS — R569 Unspecified convulsions: Secondary | ICD-10-CM

## 2017-11-06 DIAGNOSIS — I10 Essential (primary) hypertension: Secondary | ICD-10-CM | POA: Insufficient documentation

## 2017-11-06 DIAGNOSIS — Z590 Homelessness: Secondary | ICD-10-CM | POA: Insufficient documentation

## 2017-11-06 LAB — ETHANOL: Alcohol, Ethyl (B): 321 mg/dL (ref <10)

## 2017-11-06 LAB — CBC WITH DIFFERENTIAL/PLATELET
Abs Immature Granulocytes: 0 K/uL (ref 0.0–0.1)
Basophils Absolute: 0.1 K/uL (ref 0.0–0.1)
Basophils Relative: 2 %
Eosinophils Absolute: 0.1 K/uL (ref 0.0–0.7)
Eosinophils Relative: 3 %
HCT: 30.4 % — ABNORMAL LOW (ref 39.0–52.0)
Hemoglobin: 9.3 g/dL — ABNORMAL LOW (ref 13.0–17.0)
Immature Granulocytes: 0 %
Lymphocytes Relative: 30 %
Lymphs Abs: 1 K/uL (ref 0.7–4.0)
MCH: 23.6 pg — ABNORMAL LOW (ref 26.0–34.0)
MCHC: 30.6 g/dL (ref 30.0–36.0)
MCV: 77.2 fL — ABNORMAL LOW (ref 78.0–100.0)
Monocytes Absolute: 0.7 K/uL (ref 0.1–1.0)
Monocytes Relative: 21 %
Neutro Abs: 1.5 K/uL — ABNORMAL LOW (ref 1.7–7.7)
Neutrophils Relative %: 44 %
Platelets: 95 K/uL — ABNORMAL LOW (ref 150–400)
RBC: 3.94 MIL/uL — ABNORMAL LOW (ref 4.22–5.81)
RDW: 16.7 % — ABNORMAL HIGH (ref 11.5–15.5)
WBC: 3.3 K/uL — ABNORMAL LOW (ref 4.0–10.5)

## 2017-11-06 LAB — I-STAT CHEM 8, ED
BUN: <3 mg/dL — ABNORMAL LOW (ref 6–20)
Calcium, Ion: 0.98 mmol/L — ABNORMAL LOW (ref 1.15–1.40)
Chloride: 104 mmol/L (ref 98–111)
Creatinine, Ser: 1.1 mg/dL (ref 0.61–1.24)
Glucose, Bld: 88 mg/dL (ref 70–99)
HCT: 33 % — ABNORMAL LOW (ref 39.0–52.0)
Hemoglobin: 11.2 g/dL — ABNORMAL LOW (ref 13.0–17.0)
Potassium: 3.5 mmol/L (ref 3.5–5.1)
Sodium: 140 mmol/L (ref 135–145)
TCO2: 23 mmol/L (ref 22–32)

## 2017-11-06 LAB — COMPREHENSIVE METABOLIC PANEL WITH GFR
ALT: 31 U/L (ref 0–44)
AST: 112 U/L — ABNORMAL HIGH (ref 15–41)
Albumin: 3.5 g/dL (ref 3.5–5.0)
Alkaline Phosphatase: 85 U/L (ref 38–126)
Anion gap: 10 (ref 5–15)
BUN: <5 mg/dL — ABNORMAL LOW (ref 6–20)
CO2: 21 mmol/L — ABNORMAL LOW (ref 22–32)
Calcium: 7.7 mg/dL — ABNORMAL LOW (ref 8.9–10.3)
Chloride: 104 mmol/L (ref 98–111)
Creatinine, Ser: 0.78 mg/dL (ref 0.61–1.24)
GFR calc Af Amer: >60 mL/min
GFR calc non Af Amer: >60 mL/min
Glucose, Bld: 94 mg/dL (ref 70–99)
Potassium: 3.3 mmol/L — ABNORMAL LOW (ref 3.5–5.1)
Sodium: 135 mmol/L (ref 135–145)
Total Bilirubin: 0.8 mg/dL (ref 0.3–1.2)
Total Protein: 6.6 g/dL (ref 6.5–8.1)

## 2017-11-06 LAB — I-STAT TROPONIN, ED: Troponin i, poc: 0 ng/mL (ref 0.00–0.08)

## 2017-11-06 MED ORDER — SODIUM CHLORIDE 0.9 % IV BOLUS
1000.0000 mL | Freq: Once | INTRAVENOUS | Status: AC
  Administered 2017-11-06: 1000 mL via INTRAVENOUS

## 2017-11-06 MED ORDER — SODIUM CHLORIDE 0.9 % IV SOLN
INTRAVENOUS | Status: DC
  Administered 2017-11-06: 22:00:00 via INTRAVENOUS

## 2017-11-06 NOTE — ED Provider Notes (Signed)
[facility] EMERGENCY DEPARTMENT Provider Note   CSN: [id number] Arrival date & time: 11/06/17  1841     History   Chief Complaint Chief Complaint  Patient presents with  . Seizures    HPI Mohammed Mcandrew is a 52 y.o. male.  Patient had a seizure today and was brought in by EMS.  They gave him some Versed.  Patient has a history of EtOH abuse and seizures.  The history is provided by the EMS personnel. No language interpreter was used.  Seizures   This is a recurrent problem. The current episode started less than 1 hour ago. The problem has been resolved. There was 1 seizure. The most recent episode lasted 2 to 5 minutes. Associated symptoms include sleepiness. Pertinent negatives include no headaches, no chest pain, no cough and no diarrhea. Characteristics do not include eye blinking. The episode was witnessed. There was no sensation of an aura present. The seizure(s) had no focality. Possible causes do not include medication or dosage change. There has been no fever (No fever).    Past Medical History:  Diagnosis Date  . Alcoholic hepatitis   . Depression   . ETOH abuse   . Hypertension   . Pancreatitis   . Seizures ([place])   . Suicidal behavior     Patient Active Problem List   Diagnosis Date Noted  . Substance induced mood disorder ([place]) 07/16/2017  . Severe recurrent major depression without psychotic features ([place]) 05/15/2017  . Anemia 03/18/2017  . Acute blood loss anemia 02/21/2017  . UGI bleed 02/08/2017  . Alcohol withdrawal ([place]) 02/08/2017  . Cocaine abuse ([place]) 11/18/2016  . GI bleed 11/01/2016  . Alcohol abuse   . Major depressive disorder, recurrent severe without psychotic features ([place]) 07/06/2016  . 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 Seizure ([place]) 05/26/2016  . Tobacco abuse 05/26/2016  . Homeless 05/26/2016  . Essential hypertension 05/26/2016  . Alcohol dependence with alcohol-induced mood disorder ([place])  02/14/2016  . Severe alcohol withdrawal without perceptual disturbances without complication ([place]) 08/65/7846  . Alcoholic hepatitis without ascites 02/14/2016  . Thrombocytopenia ([place]) 02/14/2016    Past Surgical History:  Procedure Laterality Date  . ESOPHAGOGASTRODUODENOSCOPY (EGD) WITH PROPOFOL N/A 11/02/2016   Procedure: ESOPHAGOGASTRODUODENOSCOPY (EGD) WITH PROPOFOL;  Surgeon: Carol Ada, MD;  Location: [facility];  Service: Endoscopy;  Laterality: N/A;  . HERNIA REPAIR    . LEG SURGERY          Home Medications    Prior to Admission medications   Medication Sig Start Date End Date Taking? Authorizing Provider  chlordiazePOXIDE (LIBRIUM) 25 MG capsule 50mg  PO TID x 1D, then 25-50mg  PO BID X 1D, then 25-50mg  PO QD X 1D Patient not taking: Reported on 10/06/2017 09/29/17   Deno Etienne, DO  citalopram (CELEXA) 20 MG tablet Take 1 tablet (20 mg total) by mouth daily. For mood control Patient not taking: Reported on 09/17/2017 07/18/17   Money, Lowry Ram, FNP  famotidine (PEPCID) 20 MG tablet Take 1 tablet (20 mg total) by mouth 2 (two) times daily. Patient not taking: Reported on 09/25/2017 09/12/17   Quintella Reichert, MD  gabapentin (NEURONTIN) 300 MG capsule Take 1 capsule (300 mg total) by mouth 3 (three) times daily. For withdrawal symptoms Patient not taking: Reported on 09/17/2017 07/18/17   Money, Lowry Ram, FNP  naltrexone (DEPADE) 50 MG tablet Take 1 tablet (50 mg total) by mouth daily. For cravings Patient not taking: Reported on 09/17/2017 07/19/17  Money, Lowry Ram, FNP  ondansetron (ZOFRAN ODT) 4 MG disintegrating tablet Take 1 tablet (4 mg total) by mouth every 8 (eight) hours as needed for nausea or vomiting. Patient not taking: Reported on 09/25/2017 09/12/17   Quintella Reichert, MD  sucralfate (CARAFATE) 1 GM/10ML suspension Take 10 mLs (1 g total) by mouth 4 (four) times daily -  with meals and at bedtime. Patient not taking: Reported on 09/17/2017 09/12/17   Quintella Reichert, MD    traZODone (DESYREL) 100 MG tablet Take 1 tablet (100 mg total) by mouth at bedtime. For insomnia Patient not taking: Reported on 09/17/2017 07/18/17   Money, Lowry Ram, FNP    Family History Family History  Problem Relation Age of Onset  . Diabetes Mother   . Alcoholism Mother   . Emphysema Father   . Lung cancer Father   . Alcoholism Father     Social History Social History   Tobacco Use  . Smoking status: Current Every Day Smoker    Packs/day: 1.00    Types: Cigarettes  . Smokeless tobacco: Never Used  Substance Use Topics  . Alcohol use: Yes    Comment: 4-5 40oz beers daily  . Drug use: Yes    Frequency: 3.0 times per week    Types: Cocaine     Allergies   Tomato and Aspirin   Review of Systems Review of Systems  Unable to perform ROS: Mental status change  Constitutional: Negative for appetite change and fatigue.  HENT: Negative for congestion, ear discharge and sinus pressure.   Eyes: Negative for discharge.  Respiratory: Negative for cough.   Cardiovascular: Negative for chest pain.  Gastrointestinal: Negative for abdominal pain and diarrhea.  Genitourinary: Negative for frequency and hematuria.  Musculoskeletal: Negative for back pain.  Skin: Negative for rash.  Neurological: Positive for seizures. Negative for headaches.  Psychiatric/Behavioral: Negative for hallucinations.     Physical Exam Updated Vital Signs BP 127/86   Pulse (!) 101   Temp 98.4 F (36.9 C) (Oral)   Resp (!) 29   Ht 5\' 7"  (1.702 m)   Wt 81.6 kg   SpO2 99%   BMI 28.19 kg/m   Physical Exam  Constitutional: He appears well-developed.  HENT:  Head: Normocephalic.  Eyes: Conjunctivae and EOM are normal. No scleral icterus.  Neck: Neck supple. No thyromegaly present.  Cardiovascular: Normal rate and regular rhythm. Exam reveals no gallop and no friction rub.  No murmur heard. Pulmonary/Chest: No stridor. He has no wheezes. He has no rales. He exhibits no tenderness.   Abdominal: He exhibits no distension. There is no tenderness. There is no rebound.  Musculoskeletal: Normal range of motion. He exhibits no edema.  Lymphadenopathy:    He has no cervical adenopathy.  Neurological: He exhibits normal muscle tone. Coordination normal.  Patient lethargic  Skin: No rash noted. No erythema.  Psychiatric: He has a normal mood and affect. His behavior is normal.     ED Treatments / Results  Labs (all labs ordered are listed, but only abnormal results are displayed) Labs Reviewed  CBC WITH DIFFERENTIAL/PLATELET - Abnormal; Notable for the following components:      Result Value   WBC 3.3 (*)    RBC 3.94 (*)    Hemoglobin 9.3 (*)    HCT 30.4 (*)    MCV 77.2 (*)    MCH 23.6 (*)    RDW 16.7 (*)    Platelets 95 (*)    Neutro Abs 1.5 (*)  All other components within normal limits  COMPREHENSIVE METABOLIC PANEL - Abnormal; Notable for the following components:   Potassium 3.3 (*)    CO2 21 (*)    BUN <5 (*)    Calcium 7.7 (*)    AST 112 (*)    All other components within normal limits  ETHANOL - Abnormal; Notable for the following components:   Alcohol, Ethyl (B) 321 (*)    All other components within normal limits  I-STAT CHEM 8, ED - Abnormal; Notable for the following components:   BUN <3 (*)    Calcium, Ion 0.98 (*)    Hemoglobin 11.2 (*)    HCT 33.0 (*)    All other components within normal limits  I-STAT TROPONIN, ED    EKG None  Radiology Ct Head Wo Contrast  Result Date: 11/06/2017 CLINICAL DATA:  Loss of consciousness. EXAM: CT HEAD WITHOUT CONTRAST TECHNIQUE: Contiguous axial images were obtained from the base of the skull through the vertex without intravenous contrast. COMPARISON:  10/14/2017 FINDINGS: Brain: No evidence of acute infarction, hemorrhage, hydrocephalus, extra-axial collection or mass lesion/mass effect. Vascular: Mild calcific atherosclerotic disease. Skull: Normal. Negative for fracture or focal lesion.  Sinuses/Orbits: No acute finding. Other: None. IMPRESSION: No acute intracranial abnormality. Electronically Signed   By: Fidela Salisbury M.D.   On: 11/06/2017 20:06   Dg Chest Port 1 View  Result Date: 11/06/2017 CLINICAL DATA:  Shortness of breath. EXAM: PORTABLE CHEST 1 VIEW COMPARISON:  09/11/2017 FINDINGS: Cardiomediastinal silhouette is normal. Mediastinal contours appear intact. There is no evidence of focal airspace consolidation, pleural effusion or pneumothorax. Osseous structures are without acute abnormality. Soft tissues are grossly normal. IMPRESSION: No active disease. Electronically Signed   By: Fidela Salisbury M.D.   On: 11/06/2017 19:32    Procedures Procedures (including critical care time)  Medications Ordered in ED Medications  0.9 %  sodium chloride infusion (has no administration in time range)  sodium chloride 0.9 % bolus 1,000 mL (1,000 mLs Intravenous New Bag/Given 11/06/17 1930)     Initial Impression / Assessment and Plan / ED Course  I have reviewed the triage vital signs and the nursing notes.  Pertinent labs & imaging results that were available during my care of the patient were reviewed by me and considered in my medical decision making (see chart for details).     She with EtOH abuse and seizure today.  Patient was reevaluated at 2 hours and he was awake alert oriented.  Patient stated he wanted to rest for little while.  Patient to call over 300 he will be observed for about 6 hours and if he is able to ambulate without falling he can be discharged home  Final Clinical Impressions(s) / ED Diagnoses   Final diagnoses:  None    ED Discharge Orders    None       Milton Ferguson, MD 11/06/17 2133

## 2017-11-06 NOTE — ED Notes (Signed)
Date and time results received: 11/06/17 2100 (use smartphrase ".now" to insert current time)  Test: Ethanol Critical Value: 321  Name of Provider Notified: Dr. Roderic Palau  Orders Received? Or Actions Taken?: no new orders

## 2017-11-06 NOTE — ED Triage Notes (Signed)
[facility] reported cbg of 113 with stable v/s.  Pt laid down for 1st seizure and was with ems for 2nd seizure. EMS gave a dose of  Versed.

## 2017-11-07 NOTE — ED Provider Notes (Signed)
Patient is alert and oriented.  Clinically sober for discharge.     Montine Circle, PA-C 11/07/17 Clyda Greener, MD 11/08/17 1155

## 2017-11-07 NOTE — ED Notes (Signed)
Ambulated pt, stated he was dizzy. Informed PA, okay to rest here a while longer, will reasess

## 2017-11-16 ENCOUNTER — Emergency Department (HOSPITAL_COMMUNITY)
Admission: EM | Admit: 2017-11-16 | Discharge: 2017-11-17 | Disposition: A | Discharge status: Home / Self Care | Payer: Self-pay | Attending: Emergency Medicine | Admitting: Emergency Medicine

## 2017-11-16 ENCOUNTER — Emergency Department (HOSPITAL_COMMUNITY): Payer: Self-pay

## 2017-11-16 ENCOUNTER — Other Ambulatory Visit: Payer: Self-pay

## 2017-11-16 ENCOUNTER — Encounter (HOSPITAL_COMMUNITY): Payer: Self-pay | Admitting: Emergency Medicine

## 2017-11-16 DIAGNOSIS — F10920 Alcohol use, unspecified with intoxication, uncomplicated: Secondary | ICD-10-CM

## 2017-11-16 DIAGNOSIS — M549 Dorsalgia, unspecified: Secondary | ICD-10-CM

## 2017-11-16 DIAGNOSIS — F10929 Alcohol use, unspecified with intoxication, unspecified: Secondary | ICD-10-CM | POA: Insufficient documentation

## 2017-11-16 DIAGNOSIS — F17210 Nicotine dependence, cigarettes, uncomplicated: Secondary | ICD-10-CM | POA: Insufficient documentation

## 2017-11-16 DIAGNOSIS — M545 Low back pain, unspecified: Secondary | ICD-10-CM

## 2017-11-16 DIAGNOSIS — W19XXXA Unspecified fall, initial encounter: Secondary | ICD-10-CM

## 2017-11-16 MED ORDER — GI COCKTAIL ~~LOC~~
30.0000 mL | Freq: Once | ORAL | Status: AC
  Administered 2017-11-17: 30 mL via ORAL
  Filled 2017-11-16: qty 30

## 2017-11-16 MED ORDER — ACETAMINOPHEN 325 MG PO TABS
650.0000 mg | ORAL_TABLET | Freq: Once | ORAL | Status: AC
  Administered 2017-11-17: 650 mg via ORAL
  Filled 2017-11-16: qty 2

## 2017-11-16 NOTE — ED Provider Notes (Signed)
[facility] EMERGENCY DEPARTMENT Provider Note   CSN: [id number] Arrival date & time: 11/16/17  2044     History   Chief Complaint Chief Complaint  Patient presents with  . Fall  . Back Pain  . Abdominal Pain    HPI Barry Moore is a 52 y.o. male.  HPI  This a 52 year old male with history of alcohol abuse, pancreatitis, hypertension who presents with back pain.  Patient reports that he tripped and fell backwards yesterday landing on his bottom.  Since that time he has had increasing back pain.  Currently rates pain at 9 out of 10.  He has not taken anything for the pain.  He states the pain occasionally radiates into the left leg.  Pain is worse with walking.  Denies weakness, numbness, bowel or bladder issues.  Denies hitting his head or loss of consciousness.  Additionally patient reports abdominal pain.  He states he has had waxing and waning abdominal pain over the last 2 weeks.  It is sharp in nature and epigastric.  Reports some nausea without vomiting.  No diarrhea.  He continues to drink daily.  Past Medical History:  Diagnosis Date  . Alcoholic hepatitis   . Depression   . ETOH abuse   . Hypertension   . Pancreatitis   . Seizures ([place])   . Suicidal behavior     Patient Active Problem List   Diagnosis Date Noted  . Substance induced mood disorder ([place]) 07/16/2017  . Severe recurrent major depression without psychotic features ([place]) 05/15/2017  . Anemia 03/18/2017  . Acute blood loss anemia 02/21/2017  . UGI bleed 02/08/2017  . Alcohol withdrawal ([place]) 02/08/2017  . Cocaine abuse ([place]) 11/18/2016  . GI bleed 11/01/2016  . Alcohol abuse   . Major depressive disorder, recurrent severe without psychotic features ([place]) 07/06/2016  . 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 Seizure ([place]) 05/26/2016  . Tobacco abuse 05/26/2016  . Homeless 05/26/2016  . Essential hypertension 05/26/2016  . Alcohol dependence with alcohol-induced mood  disorder ([place]) 02/14/2016  . Severe alcohol withdrawal without perceptual disturbances without complication ([place]) 16/09/3708  . Alcoholic hepatitis without ascites 02/14/2016  . Thrombocytopenia ([place]) 02/14/2016    Past Surgical History:  Procedure Laterality Date  . ESOPHAGOGASTRODUODENOSCOPY (EGD) WITH PROPOFOL N/A 11/02/2016   Procedure: ESOPHAGOGASTRODUODENOSCOPY (EGD) WITH PROPOFOL;  Surgeon: Carol Ada, MD;  Location: [facility] ENDOSCOPY;  Service: Endoscopy;  Laterality: N/A;  . HERNIA REPAIR    . LEG SURGERY          Home Medications    Prior to Admission medications   Medication Sig Start Date End Date Taking? Authorizing Provider  acetaminophen (TYLENOL) 325 MG tablet Take 2 tablets (650 mg total) by mouth every 6 (six) hours as needed. 11/17/17   Tamaria Dunleavy, Barbette Hair, MD  chlordiazePOXIDE (LIBRIUM) 25 MG capsule 50mg  PO TID x 1D, then 25-50mg  PO BID X 1D, then 25-50mg  PO QD X 1D Patient not taking: Reported on 10/06/2017 09/29/17   Deno Etienne, DO  citalopram (CELEXA) 20 MG tablet Take 1 tablet (20 mg total) by mouth daily. For mood control Patient not taking: Reported on 09/17/2017 07/18/17   Money, Lowry Ram, FNP  famotidine (PEPCID) 20 MG tablet Take 1 tablet (20 mg total) by mouth 2 (two) times daily. Patient not taking: Reported on 09/25/2017 09/12/17   Quintella Reichert, MD  gabapentin (NEURONTIN) 300 MG capsule Take 1 capsule (300 mg total) by mouth 3 (three) times daily. For withdrawal symptoms  Patient not taking: Reported on 09/17/2017 07/18/17   Money, Lowry Ram, FNP  naltrexone (DEPADE) 50 MG tablet Take 1 tablet (50 mg total) by mouth daily. For cravings Patient not taking: Reported on 09/17/2017 07/19/17   Money, Lowry Ram, FNP  ondansetron (ZOFRAN ODT) 4 MG disintegrating tablet Take 1 tablet (4 mg total) by mouth every 8 (eight) hours as needed for nausea or vomiting. Patient not taking: Reported on 11/06/2017 09/12/17   Quintella Reichert, MD  sucralfate (CARAFATE) 1 GM/10ML suspension  Take 10 mLs (1 g total) by mouth 4 (four) times daily -  with meals and at bedtime. Patient not taking: Reported on 09/17/2017 09/12/17   Quintella Reichert, MD  traZODone (DESYREL) 100 MG tablet Take 1 tablet (100 mg total) by mouth at bedtime. For insomnia Patient not taking: Reported on 09/17/2017 07/18/17   Money, Lowry Ram, FNP    Family History Family History  Problem Relation Age of Onset  . Diabetes Mother   . Alcoholism Mother   . Emphysema Father   . Lung cancer Father   . Alcoholism Father     Social History Social History   Tobacco Use  . Smoking status: Current Every Day Smoker    Packs/day: 1.00    Types: Cigarettes  . Smokeless tobacco: Never Used  Substance Use Topics  . Alcohol use: Yes    Comment: 4-5 40oz beers daily  . Drug use: Yes    Frequency: 3.0 times per week    Types: Cocaine     Allergies   Tomato and Aspirin   Review of Systems Review of Systems  Constitutional: Negative for fever.  Respiratory: Negative for shortness of breath.   Cardiovascular: Negative for chest pain.  Gastrointestinal: Positive for abdominal pain and nausea. Negative for diarrhea and vomiting.  Genitourinary: Negative for difficulty urinating and dysuria.  Musculoskeletal: Positive for back pain.  Neurological: Negative for weakness and numbness.  All other systems reviewed and are negative.    Physical Exam Updated Vital Signs BP (!) 140/91 (BP Location: Right Arm)   Pulse 100   Temp 99.6 F (37.6 C) (Oral)   Resp 16   SpO2 100%   Physical Exam  Constitutional: He is oriented to person, place, and time.  Disheveled appearing but nontoxic  HENT:  Head: Normocephalic and atraumatic.  Neck: Neck supple.  Cardiovascular: Normal rate, regular rhythm and normal heart sounds.  No murmur heard. Pulmonary/Chest: Effort normal and breath sounds normal. No respiratory distress. He has no wheezes.  Abdominal: Soft. Bowel sounds are normal. There is no tenderness. There  is no rebound and no guarding.  Musculoskeletal: He exhibits no edema.  Tenderness palpation lower lumbar spine, no step-off or deformity  Lymphadenopathy:    He has no cervical adenopathy.  Neurological: He is alert and oriented to person, place, and time.  5 out of 5 strength bilateral lower extremities, normal reflexes bilaterally, negative straight leg raise  Skin: Skin is warm and dry.  Psychiatric: He has a normal mood and affect.  Nursing note and vitals reviewed.    ED Treatments / Results  Labs (all labs ordered are listed, but only abnormal results are displayed) Labs Reviewed  CBC WITH DIFFERENTIAL/PLATELET - Abnormal; Notable for the following components:      Result Value   WBC 3.5 (*)    RBC 3.80 (*)    Hemoglobin 9.0 (*)    HCT 29.8 (*)    MCH 23.7 (*)    RDW 17.4 (*)  Platelets 105 (*)    Neutro Abs 1.6 (*)    All other components within normal limits  COMPREHENSIVE METABOLIC PANEL - Abnormal; Notable for the following components:   CO2 20 (*)    Glucose, Bld 103 (*)    BUN <5 (*)    Calcium 8.5 (*)    AST 155 (*)    ALT 52 (*)    All other components within normal limits  LIPASE, BLOOD - Abnormal; Notable for the following components:   Lipase 52 (*)    All other components within normal limits  ETHANOL - Abnormal; Notable for the following components:   Alcohol, Ethyl (B) 254 (*)    All other components within normal limits  URINALYSIS, ROUTINE W REFLEX MICROSCOPIC    EKG None  Radiology Dg Lumbar Spine Complete  Result Date: 11/17/2017 CLINICAL DATA:  Fall, low back pain EXAM: LUMBAR SPINE - COMPLETE 4+ VIEW COMPARISON:  10/20/2017 FINDINGS: Degenerative disc disease changes at L4-5 and L5-S1 with disc space narrowing and spurring. Mild degenerative facet disease throughout the lumbar spine, most pronounced in the lower lumbar spine. No fracture or malalignment. SI joints are symmetric and unremarkable. IMPRESSION: Degenerative disc and facet  disease as above. No acute bony abnormality. Electronically Signed   By: Rolm Baptise M.D.   On: 11/17/2017 00:06    Procedures Procedures (including critical care time)  Medications Ordered in ED Medications  acetaminophen (TYLENOL) tablet 650 mg (650 mg Oral Given 11/17/17 0014)  gi cocktail (Maalox,Lidocaine,Donnatal) (30 mLs Oral Given 11/17/17 0014)     Initial Impression / Assessment and Plan / ED Course  I have reviewed the triage vital signs and the nursing notes.  Pertinent labs & imaging results that were available during my care of the patient were reviewed by me and considered in my medical decision making (see chart for details).    Patient presents with back pain.  Reports fall 2020 4 hours ago.  He is disheveled appearing but nontoxic and vital signs are mostly reassuring.  No signs or symptoms of cauda equina.  Exam is not consistent with sciatica.  He has no red flags.  Plain films obtained and are reassuring without fracture.  Additionally, patient has reported some ongoing abdominal pain.  He continues to abuse alcohol.  Suspect gastritis.  Lab work-up is at the patient's baseline including a minimally elevated lipase and LFTs.  Patient was given Carafate.  Will discharge with omeprazole and Tylenol for pain.  Patient clinically sober at discharge and able to ambulate without difficulty.  After history, exam, and medical workup I feel the patient has been appropriately medically screened and is safe for discharge home. Pertinent diagnoses were discussed with the patient. Patient was given return precautions.   Final Clinical Impressions(s) / ED Diagnoses   Final diagnoses:  Back pain  Fall  Acute midline low back pain without sciatica  Alcoholic intoxication without complication [facility])    ED Discharge Orders         Ordered    acetaminophen (TYLENOL) 325 MG tablet  Every 6 hours PRN     11/17/17 0254           Merryl Hacker, MD 11/17/17 [phone]

## 2017-11-16 NOTE — ED Notes (Signed)
Pt complains to PA-C that he is having abd pains with a history of pancreatitis. PA-C states the pt needs a room for evaluation of his abd pains and he is no longer appropriate for fast track.

## 2017-11-16 NOTE — ED Triage Notes (Signed)
Pt states he tripped and fell yesterday.  C/o pain to lower back that radiates down R leg.  Taking Ibuprofen without relief.

## 2017-11-16 NOTE — ED Notes (Signed)
Pt reports tripping and falling yesterday, injuring his lower back. Pt denies any dizziness when he fell.

## 2017-11-16 NOTE — ED Provider Notes (Signed)
Patient placed in Quick Look pathway, seen and evaluated   Chief Complaint: Fall, back pain, abdominal pain, emesis  HPI:   Patient presents initially for evaluation of fall with right-sided back pain radiating into the right leg.  Patient reports he tripped and landed on his low back yesterday.  After gathering further information patient also reports that he has been having diffuse abdominal pain worsening over the past few days, patient reports he vomited 4 times today, and has had multiple episodes of dry heaving today.  He is unsure if he has had any fevers.  He denies any urinary symptoms.  Does report he has been constipated and is unsure if he seen any blood in his stools.  Patient denies loss of bowel or bladder control.  No saddle anesthesia.  He reports some tingling in his right leg, but no numbness or weakness, he has been ambulatory with some discomfort.  Has tried ibuprofen has not taken anything else for low back pain.  Pt reports active alcohol use last drink just prior to arrival.  Does have history of pancreatitis.  ROS: + Back pain, abdominal pain, vomiting, constipation. -Fevers, diarrhea, melena, hematochezia, numbness, weakness  Physical Exam:   Gen: No distress  Neuro: Awake and Alert  Skin: Warm    Focused Exam: Patient has generalized abdominal tenderness with diffuse guarding.  Tenderness across the lumbar spine with focal tenderness at midline, no palpable deformity or overlying skin changes.  Bilateral lower extremities with 5/5 strength, and sensation intact.  Initiation of care has begun.  Abdominal labs and CT ordered.  The patient has been counseled on the process, plan, and necessity for staying for the completion/evaluation, and the remainder of the medical screening examination   Janet Berlin 11/16/17 2223    Drenda Freeze, MD 11/19/17 2226

## 2017-11-16 NOTE — ED Notes (Signed)
Patient transported to X-ray 

## 2017-11-17 LAB — CBC WITH DIFFERENTIAL/PLATELET
Abs Immature Granulocytes: 0 10*3/uL (ref 0.0–0.1)
BASOS ABS: 0.1 10*3/uL (ref 0.0–0.1)
BASOS PCT: 1 %
EOS ABS: 0.1 10*3/uL (ref 0.0–0.7)
Eosinophils Relative: 2 %
HCT: 29.8 % — ABNORMAL LOW (ref 39.0–52.0)
Hemoglobin: 9 g/dL — ABNORMAL LOW (ref 13.0–17.0)
IMMATURE GRANULOCYTES: 0 %
Lymphocytes Relative: 29 %
Lymphs Abs: 1 10*3/uL (ref 0.7–4.0)
MCH: 23.7 pg — ABNORMAL LOW (ref 26.0–34.0)
MCHC: 30.2 g/dL (ref 30.0–36.0)
MCV: 78.4 fL (ref 78.0–100.0)
MONOS PCT: 22 %
Monocytes Absolute: 0.8 10*3/uL (ref 0.1–1.0)
NEUTROS PCT: 46 %
Neutro Abs: 1.6 10*3/uL — ABNORMAL LOW (ref 1.7–7.7)
PLATELETS: 105 10*3/uL — AB (ref 150–400)
RBC: 3.8 MIL/uL — AB (ref 4.22–5.81)
RDW: 17.4 % — AB (ref 11.5–15.5)
WBC: 3.5 10*3/uL — AB (ref 4.0–10.5)

## 2017-11-17 LAB — COMPREHENSIVE METABOLIC PANEL
ALT: 52 U/L — ABNORMAL HIGH (ref 0–44)
AST: 155 U/L — AB (ref 15–41)
Albumin: 3.6 g/dL (ref 3.5–5.0)
Alkaline Phosphatase: 108 U/L (ref 38–126)
Anion gap: 15 (ref 5–15)
CHLORIDE: 103 mmol/L (ref 98–111)
CO2: 20 mmol/L — ABNORMAL LOW (ref 22–32)
Calcium: 8.5 mg/dL — ABNORMAL LOW (ref 8.9–10.3)
Creatinine, Ser: 0.82 mg/dL (ref 0.61–1.24)
GFR calc Af Amer: >60 mL/min (ref >60)
Glucose, Bld: 103 mg/dL — ABNORMAL HIGH (ref 70–99)
POTASSIUM: 3.7 mmol/L (ref 3.5–5.1)
SODIUM: 138 mmol/L (ref 135–145)
Total Bilirubin: 1 mg/dL (ref 0.3–1.2)
Total Protein: 6.7 g/dL (ref 6.5–8.1)

## 2017-11-17 LAB — ETHANOL: Alcohol, Ethyl (B): 254 mg/dL — ABNORMAL HIGH (ref <10)

## 2017-11-17 LAB — LIPASE, BLOOD: LIPASE: 52 U/L — AB (ref 11–51)

## 2017-11-17 MED ORDER — OMEPRAZOLE 20 MG PO CPDR: 20.0000 mg | DELAYED_RELEASE_CAPSULE | Freq: Every day | ORAL | 0 refills | Status: DC

## 2017-11-17 MED ORDER — ACETAMINOPHEN 325 MG PO TABS: 650.0000 mg | ORAL_TABLET | Freq: Four times a day (QID) | ORAL | 0 refills | Status: DC | PRN

## 2017-11-17 MED ORDER — SUCRALFATE 1 G PO TABS: 1.0000 g | ORAL_TABLET | Freq: Three times a day (TID) | ORAL | 0 refills | Status: DC

## 2017-11-17 NOTE — Discharge Instructions (Addendum)
He was seen today for back pain.  Your x-rays are negative.  Take ibuprofen or Tylenol as needed for pain.  You are also noted to be intoxicated.  This will increase her fall risk.

## 2017-11-17 NOTE — ED Notes (Signed)
Patient verbalizes understanding of discharge instructions. Opportunity for questioning and answers were provided. Armband removed by staff, pt discharged from ED.  

## 2017-11-23 ENCOUNTER — Emergency Department (HOSPITAL_COMMUNITY)
Admission: EM | Admit: 2017-11-23 | Discharge: 2017-11-24 | Disposition: A | Discharge status: Home / Self Care | Payer: Self-pay | Attending: Emergency Medicine | Admitting: Emergency Medicine

## 2017-11-23 ENCOUNTER — Encounter (HOSPITAL_COMMUNITY): Payer: Self-pay

## 2017-11-23 DIAGNOSIS — F10120 Alcohol abuse with intoxication, uncomplicated: Secondary | ICD-10-CM | POA: Insufficient documentation

## 2017-11-23 DIAGNOSIS — F1010 Alcohol abuse, uncomplicated: Secondary | ICD-10-CM

## 2017-11-23 DIAGNOSIS — F1490 Cocaine use, unspecified, uncomplicated: Secondary | ICD-10-CM | POA: Insufficient documentation

## 2017-11-23 DIAGNOSIS — F10920 Alcohol use, unspecified with intoxication, uncomplicated: Secondary | ICD-10-CM

## 2017-11-23 DIAGNOSIS — Y908 Blood alcohol level of 240 mg/100 ml or more: Secondary | ICD-10-CM | POA: Insufficient documentation

## 2017-11-23 DIAGNOSIS — I10 Essential (primary) hypertension: Secondary | ICD-10-CM | POA: Insufficient documentation

## 2017-11-23 DIAGNOSIS — F17210 Nicotine dependence, cigarettes, uncomplicated: Secondary | ICD-10-CM | POA: Insufficient documentation

## 2017-11-23 LAB — RAPID URINE DRUG SCREEN, HOSP PERFORMED
Amphetamines: NOT DETECTED
BARBITURATES: NOT DETECTED
Benzodiazepines: NOT DETECTED
Cocaine: NOT DETECTED
Opiates: NOT DETECTED
Tetrahydrocannabinol: NOT DETECTED

## 2017-11-23 LAB — COMPREHENSIVE METABOLIC PANEL
ALK PHOS: 125 U/L (ref 38–126)
ALT: 58 U/L — AB (ref 0–44)
AST: 194 U/L — ABNORMAL HIGH (ref 15–41)
Albumin: 3.8 g/dL (ref 3.5–5.0)
Anion gap: 13 (ref 5–15)
BUN: 5 mg/dL — AB (ref 6–20)
CALCIUM: 8.8 mg/dL — AB (ref 8.9–10.3)
CO2: 23 mmol/L (ref 22–32)
CREATININE: 0.8 mg/dL (ref 0.61–1.24)
Chloride: 105 mmol/L (ref 98–111)
GFR calc non Af Amer: >60 mL/min (ref >60)
GLUCOSE: 95 mg/dL (ref 70–99)
Potassium: 3.6 mmol/L (ref 3.5–5.1)
SODIUM: 141 mmol/L (ref 135–145)
Total Bilirubin: 0.7 mg/dL (ref 0.3–1.2)
Total Protein: 7.2 g/dL (ref 6.5–8.1)

## 2017-11-23 LAB — CBC
HCT: 31.2 % — ABNORMAL LOW (ref 39.0–52.0)
Hemoglobin: 9.7 g/dL — ABNORMAL LOW (ref 13.0–17.0)
MCH: 23.7 pg — AB (ref 26.0–34.0)
MCHC: 31.1 g/dL (ref 30.0–36.0)
MCV: 76.3 fL — AB (ref 78.0–100.0)
PLATELETS: 132 10*3/uL — AB (ref 150–400)
RBC: 4.09 MIL/uL — ABNORMAL LOW (ref 4.22–5.81)
RDW: 17.7 % — AB (ref 11.5–15.5)
WBC: 2.8 10*3/uL — AB (ref 4.0–10.5)

## 2017-11-23 LAB — ETHANOL: Alcohol, Ethyl (B): 404 mg/dL (ref <10)

## 2017-11-23 NOTE — ED Triage Notes (Signed)
Pt arrived via EMS and reports that he had 4 Sz today. Per EMS no active SZ enroute. Per witness Pt has hand tremors but no LOC. Pt also reports diarrhea x 2 weeks, and he has SI. PT reports that he will run into traffic or slit his wrist as his plan. Ethol onboard reports .      EMS  V/s 132/86, 95% RA  HR 93, RR 20

## 2017-11-23 NOTE — Discharge Instructions (Signed)
It was our pleasure to provide your ER care today - we hope that you feel better.  Avoid any alcohol use and never drink and drive. Follow up with AA, and use resource guide provided for additional community resources.   For mental health issues and/or crisis, go directly to [facility].  Also follow up with primary care doctor in the coming week.  Return to ER if worse, new symptoms, fevers, trouble breathing, other concern.

## 2017-11-23 NOTE — ED Notes (Signed)
Bed: YP49 Expected date:  Expected time:  Means of arrival:  Comments: EMS/seizure/s.i./diarrhea

## 2017-11-23 NOTE — ED Provider Notes (Signed)
[place] DEPT Provider Note   CSN: [id number] Arrival date & time: 11/23/17  1742     History   Chief Complaint Chief Complaint  Patient presents with  . Alcohol Intoxication    HPI Barry Moore is a 52 y.o. male.  Patient with history etoh abuse, presents w ems with report of possible seizure earlier. Ems did not witness any seizure activity. +etoh use, pt cant quantify amount. Pt notes feels depressed at times. Currently denies SI. Pt poor historian ?intoxicated -  Level 5 caveat.   The history is provided by the patient and the EMS personnel. The history is limited by the condition of the patient.  Seizures      Past Medical History:  Diagnosis Date  . Alcoholic hepatitis   . Depression   . ETOH abuse   . Hypertension   . Pancreatitis   . Seizures ([place])   . Suicidal behavior     Patient Active Problem List   Diagnosis Date Noted  . Substance induced mood disorder ([place]) 07/16/2017  . Severe recurrent major depression without psychotic features ([place]) 05/15/2017  . Anemia 03/18/2017  . Acute blood loss anemia 02/21/2017  . UGI bleed 02/08/2017  . Alcohol withdrawal ([place]) 02/08/2017  . Cocaine abuse ([place]) 11/18/2016  . GI bleed 11/01/2016  . Alcohol abuse   . Major depressive disorder, recurrent severe without psychotic features ([place]) 07/06/2016  . Alcohol-induced mood disorder ([place]) 06/15/2016  . Seizure ([place]) 05/26/2016  . Tobacco abuse 05/26/2016  . Homeless 05/26/2016  . Essential hypertension 05/26/2016  . Alcohol dependence with alcohol-induced mood disorder ([place]) 02/14/2016  . Severe alcohol withdrawal without perceptual disturbances without complication ([place]) 25/42/7062  . Alcoholic hepatitis without ascites 02/14/2016  . Thrombocytopenia ([place]) 02/14/2016    Past Surgical History:  Procedure Laterality Date  . ESOPHAGOGASTRODUODENOSCOPY (EGD) WITH PROPOFOL N/A 11/02/2016   Procedure: ESOPHAGOGASTRODUODENOSCOPY  (EGD) WITH PROPOFOL;  Surgeon: Carol Ada, MD;  Location: [facility] ENDOSCOPY;  Service: Endoscopy;  Laterality: N/A;  . HERNIA REPAIR    . LEG SURGERY          Home Medications    Prior to Admission medications   Medication Sig Start Date End Date Taking? Authorizing Provider  acetaminophen (TYLENOL) 325 MG tablet Take 2 tablets (650 mg total) by mouth every 6 (six) hours as needed. 11/17/17   Horton, Barbette Hair, MD  chlordiazePOXIDE (LIBRIUM) 25 MG capsule 50mg  PO TID x 1D, then 25-50mg  PO BID X 1D, then 25-50mg  PO QD X 1D Patient not taking: Reported on 10/06/2017 09/29/17   Deno Etienne, DO  citalopram (CELEXA) 20 MG tablet Take 1 tablet (20 mg total) by mouth daily. For mood control Patient not taking: Reported on 09/17/2017 07/18/17   Money, Lowry Ram, FNP  famotidine (PEPCID) 20 MG tablet Take 1 tablet (20 mg total) by mouth 2 (two) times daily. Patient not taking: Reported on 09/25/2017 09/12/17   Quintella Reichert, MD  gabapentin (NEURONTIN) 300 MG capsule Take 1 capsule (300 mg total) by mouth 3 (three) times daily. For withdrawal symptoms Patient not taking: Reported on 09/17/2017 07/18/17   Money, Lowry Ram, FNP  naltrexone (DEPADE) 50 MG tablet Take 1 tablet (50 mg total) by mouth daily. For cravings Patient not taking: Reported on 09/17/2017 07/19/17   Money, Lowry Ram, FNP  omeprazole (PRILOSEC) 20 MG capsule Take 1 capsule (20 mg total) by mouth daily. 11/17/17   Horton, Barbette Hair, MD  ondansetron (ZOFRAN ODT) 4 MG disintegrating tablet  Take 1 tablet (4 mg total) by mouth every 8 (eight) hours as needed for nausea or vomiting. Patient not taking: Reported on 11/06/2017 09/12/17   Quintella Reichert, MD  sucralfate (CARAFATE) 1 g tablet Take 1 tablet (1 g total) by mouth 4 (four) times daily -  with meals and at bedtime. 11/17/17   Horton, Barbette Hair, MD  sucralfate (CARAFATE) 1 GM/10ML suspension Take 10 mLs (1 g total) by mouth 4 (four) times daily -  with meals and at bedtime. Patient not taking:  Reported on 09/17/2017 09/12/17   Quintella Reichert, MD  traZODone (DESYREL) 100 MG tablet Take 1 tablet (100 mg total) by mouth at bedtime. For insomnia Patient not taking: Reported on 09/17/2017 07/18/17   Money, Lowry Ram, FNP    Family History Family History  Problem Relation Age of Onset  . Diabetes Mother   . Alcoholism Mother   . Emphysema Father   . Lung cancer Father   . Alcoholism Father     Social History Social History   Tobacco Use  . Smoking status: Current Every Day Smoker    Packs/day: 1.00    Types: Cigarettes  . Smokeless tobacco: Never Used  Substance Use Topics  . Alcohol use: Yes    Comment: 4-5 40oz beers daily  . Drug use: Yes    Frequency: 3.0 times per week    Types: Cocaine     Allergies   Tomato and Aspirin   Review of Systems Review of Systems  Unable to perform ROS: Other  Constitutional: Negative for fever.  etoh abuse, poorly cooperative w questions - level 5 caveat   Physical Exam Updated Vital Signs There were no vitals taken for this visit.  Physical Exam  Constitutional: He appears well-developed and well-nourished.  HENT:  Head: Atraumatic.  Mouth/Throat: Oropharynx is clear and moist.  No oral or tongue injury.   Eyes: Pupils are equal, round, and reactive to light. Conjunctivae are normal. No scleral icterus.  Neck: Neck supple. No tracheal deviation present.  Cardiovascular: Normal rate, regular rhythm, normal heart sounds and intact distal pulses. Exam reveals no gallop and no friction rub.  No murmur heard. Pulmonary/Chest: Effort normal and breath sounds normal. No accessory muscle usage. No respiratory distress.  Abdominal: Soft. Bowel sounds are normal. He exhibits no distension. There is no tenderness.  Musculoskeletal: He exhibits no edema or tenderness.  CTLS spine, non tender, aligned, no step off.   Neurological: He is alert.  Awake and alert. Moves bil extremities purposefully w good strength.   Skin: Skin is  warm and dry. No rash noted.  Psychiatric:  Intoxicated.   Nursing note and vitals reviewed.    ED Treatments / Results  Labs (all labs ordered are listed, but only abnormal results are displayed) Results for orders placed or performed during the hospital encounter of 11/23/17  Comprehensive metabolic panel  Result Value Ref Range   Sodium 141 135 - 145 mmol/L   Potassium 3.6 3.5 - 5.1 mmol/L   Chloride 105 98 - 111 mmol/L   CO2 23 22 - 32 mmol/L   Glucose, Bld 95 70 - 99 mg/dL   BUN 5 (L) 6 - 20 mg/dL   Creatinine, Ser 0.80 0.61 - 1.24 mg/dL   Calcium 8.8 (L) 8.9 - 10.3 mg/dL   Total Protein 7.2 6.5 - 8.1 g/dL   Albumin 3.8 3.5 - 5.0 g/dL   AST 194 (H) 15 - 41 U/L   ALT 58 (H) 0 -  44 U/L   Alkaline Phosphatase 125 38 - 126 U/L   Total Bilirubin 0.7 0.3 - 1.2 mg/dL   GFR calc non Af Amer >60 >60 mL/min   GFR calc Af Amer >60 >60 mL/min   Anion gap 13 5 - 15  Ethanol  Result Value Ref Range   Alcohol, Ethyl (B) 404 (HH) <10 mg/dL  cbc  Result Value Ref Range   WBC 2.8 (L) 4.0 - 10.5 K/uL   RBC 4.09 (L) 4.22 - 5.81 MIL/uL   Hemoglobin 9.7 (L) 13.0 - 17.0 g/dL   HCT 31.2 (L) 39.0 - 52.0 %   MCV 76.3 (L) 78.0 - 100.0 fL   MCH 23.7 (L) 26.0 - 34.0 pg   MCHC 31.1 30.0 - 36.0 g/dL   RDW 17.7 (H) 11.5 - 15.5 %   Platelets 132 (L) 150 - 400 K/uL  Rapid urine drug screen (hospital performed)  Result Value Ref Range   Opiates NONE DETECTED NONE DETECTED   Cocaine NONE DETECTED NONE DETECTED   Benzodiazepines NONE DETECTED NONE DETECTED   Amphetamines NONE DETECTED NONE DETECTED   Tetrahydrocannabinol NONE DETECTED NONE DETECTED   Barbiturates NONE DETECTED NONE DETECTED   Dg Lumbar Spine Complete  Result Date: 11/17/2017 CLINICAL DATA:  Fall, low back pain EXAM: LUMBAR SPINE - COMPLETE 4+ VIEW COMPARISON:  10/20/2017 FINDINGS: Degenerative disc disease changes at L4-5 and L5-S1 with disc space narrowing and spurring. Mild degenerative facet disease throughout the lumbar  spine, most pronounced in the lower lumbar spine. No fracture or malalignment. SI joints are symmetric and unremarkable. IMPRESSION: Degenerative disc and facet disease as above. No acute bony abnormality. Electronically Signed   By: Rolm Baptise M.D.   On: 11/17/2017 00:06   Ct Head Wo Contrast  Result Date: 11/06/2017 CLINICAL DATA:  Loss of consciousness. EXAM: CT HEAD WITHOUT CONTRAST TECHNIQUE: Contiguous axial images were obtained from the base of the skull through the vertex without intravenous contrast. COMPARISON:  10/14/2017 FINDINGS: Brain: No evidence of acute infarction, hemorrhage, hydrocephalus, extra-axial collection or mass lesion/mass effect. Vascular: Mild calcific atherosclerotic disease. Skull: Normal. Negative for fracture or focal lesion. Sinuses/Orbits: No acute finding. Other: None. IMPRESSION: No acute intracranial abnormality. Electronically Signed   By: Fidela Salisbury M.D.   On: 11/06/2017 20:06   Dg Chest Port 1 View  Result Date: 11/06/2017 CLINICAL DATA:  Shortness of breath. EXAM: PORTABLE CHEST 1 VIEW COMPARISON:  09/11/2017 FINDINGS: Cardiomediastinal silhouette is normal. Mediastinal contours appear intact. There is no evidence of focal airspace consolidation, pleural effusion or pneumothorax. Osseous structures are without acute abnormality. Soft tissues are grossly normal. IMPRESSION: No active disease. Electronically Signed   By: Fidela Salisbury M.D.   On: 11/06/2017 19:32    EKG None  Radiology No results found.  Procedures Procedures (including critical care time)  Medications Ordered in ED Medications - No data to display   Initial Impression / Assessment and Plan / ED Course  I have reviewed the triage vital signs and the nursing notes.  Pertinent labs & imaging results that were available during my care of the patient were reviewed by me and considered in my medical decision making (see chart for details).  Labs sent.   Reviewed  nursing notes and prior charts for additional history.   Labs reviewed - etoh level v high. Hx same.  Recheck, pt easily aroused, no distress, no c/o of pain or other acute symptoms.   Ambulates to bathroom. Vital signs stable.  Will plan to reassess and d/c to home when clinically more sober. Will provide resource guide, and pt is encouraged to f/u pcp and etoh rehab resources, and to pursue sobriety.  Recheck pt - alert, content. Signed out to Dr Roxanne Mins to reassess when clinically more sober.     Final Clinical Impressions(s) / ED Diagnoses   Final diagnoses:  None    ED Discharge Orders    None       Lajean Saver, MD 11/23/17 2322

## 2017-11-24 NOTE — ED Provider Notes (Signed)
12:06 AM Care assumed from Dr. Ashok Cordia.  Patient presenting with ethanol intoxication.  Will need to be observed as ethanol is metabolized.  5:29 AM Patient is now awake and alert.  He is discharged with outpatient resources for detox programs.   Delora Fuel, MD 62/44/69 [phone]

## 2017-12-01 ENCOUNTER — Emergency Department (HOSPITAL_COMMUNITY): Payer: Self-pay

## 2017-12-01 ENCOUNTER — Emergency Department (HOSPITAL_COMMUNITY)
Admission: EM | Admit: 2017-12-01 | Discharge: 2017-12-02 | Disposition: A | Discharge status: Home / Self Care | Payer: Self-pay | Attending: Emergency Medicine | Admitting: Emergency Medicine

## 2017-12-01 DIAGNOSIS — Y908 Blood alcohol level of 240 mg/100 ml or more: Secondary | ICD-10-CM | POA: Insufficient documentation

## 2017-12-01 DIAGNOSIS — Y929 Unspecified place or not applicable: Secondary | ICD-10-CM | POA: Insufficient documentation

## 2017-12-01 DIAGNOSIS — F1490 Cocaine use, unspecified, uncomplicated: Secondary | ICD-10-CM | POA: Insufficient documentation

## 2017-12-01 DIAGNOSIS — W1830XA Fall on same level, unspecified, initial encounter: Secondary | ICD-10-CM | POA: Insufficient documentation

## 2017-12-01 DIAGNOSIS — F1910 Other psychoactive substance abuse, uncomplicated: Secondary | ICD-10-CM | POA: Insufficient documentation

## 2017-12-01 DIAGNOSIS — Y999 Unspecified external cause status: Secondary | ICD-10-CM | POA: Insufficient documentation

## 2017-12-01 DIAGNOSIS — I10 Essential (primary) hypertension: Secondary | ICD-10-CM | POA: Insufficient documentation

## 2017-12-01 DIAGNOSIS — F10229 Alcohol dependence with intoxication, unspecified: Secondary | ICD-10-CM | POA: Insufficient documentation

## 2017-12-01 DIAGNOSIS — R569 Unspecified convulsions: Secondary | ICD-10-CM | POA: Insufficient documentation

## 2017-12-01 DIAGNOSIS — Y939 Activity, unspecified: Secondary | ICD-10-CM | POA: Insufficient documentation

## 2017-12-01 DIAGNOSIS — F1024 Alcohol dependence with alcohol-induced mood disorder: Secondary | ICD-10-CM | POA: Diagnosis present

## 2017-12-01 DIAGNOSIS — F17210 Nicotine dependence, cigarettes, uncomplicated: Secondary | ICD-10-CM | POA: Insufficient documentation

## 2017-12-01 DIAGNOSIS — R45851 Suicidal ideations: Secondary | ICD-10-CM | POA: Insufficient documentation

## 2017-12-01 LAB — COMPREHENSIVE METABOLIC PANEL
ALT: 51 U/L — AB (ref 0–44)
AST: 153 U/L — ABNORMAL HIGH (ref 15–41)
Albumin: 3.8 g/dL (ref 3.5–5.0)
Alkaline Phosphatase: 132 U/L — ABNORMAL HIGH (ref 38–126)
Anion gap: 11 (ref 5–15)
BUN: 5 mg/dL — ABNORMAL LOW (ref 6–20)
CHLORIDE: 107 mmol/L (ref 98–111)
CO2: 23 mmol/L (ref 22–32)
CREATININE: 0.72 mg/dL (ref 0.61–1.24)
Calcium: 8.5 mg/dL — ABNORMAL LOW (ref 8.9–10.3)
GFR calc non Af Amer: >60 mL/min (ref >60)
Glucose, Bld: 112 mg/dL — ABNORMAL HIGH (ref 70–99)
Potassium: 3.6 mmol/L (ref 3.5–5.1)
SODIUM: 141 mmol/L (ref 135–145)
Total Bilirubin: 0.7 mg/dL (ref 0.3–1.2)
Total Protein: 7.4 g/dL (ref 6.5–8.1)

## 2017-12-01 LAB — CBC WITH DIFFERENTIAL/PLATELET
BASOS ABS: 0.1 10*3/uL (ref 0.0–0.1)
Basophils Relative: 2 %
EOS ABS: 0.1 10*3/uL (ref 0.0–0.7)
EOS PCT: 4 %
HCT: 31.1 % — ABNORMAL LOW (ref 39.0–52.0)
Hemoglobin: 9.6 g/dL — ABNORMAL LOW (ref 13.0–17.0)
Lymphocytes Relative: 37 %
Lymphs Abs: 1.1 10*3/uL (ref 0.7–4.0)
MCH: 23.8 pg — ABNORMAL LOW (ref 26.0–34.0)
MCHC: 30.9 g/dL (ref 30.0–36.0)
MCV: 77.2 fL — ABNORMAL LOW (ref 78.0–100.0)
Monocytes Absolute: 0.6 10*3/uL (ref 0.1–1.0)
Monocytes Relative: 20 %
Neutro Abs: 1.1 10*3/uL — ABNORMAL LOW (ref 1.7–7.7)
Neutrophils Relative %: 37 %
PLATELETS: 127 10*3/uL — AB (ref 150–400)
RBC: 4.03 MIL/uL — ABNORMAL LOW (ref 4.22–5.81)
RDW: 17.6 % — ABNORMAL HIGH (ref 11.5–15.5)
WBC: 3 10*3/uL — AB (ref 4.0–10.5)

## 2017-12-01 LAB — LIPASE, BLOOD: Lipase: 59 U/L — ABNORMAL HIGH (ref 11–51)

## 2017-12-01 LAB — RAPID URINE DRUG SCREEN, HOSP PERFORMED
AMPHETAMINES: NOT DETECTED
BENZODIAZEPINES: NOT DETECTED
Barbiturates: NOT DETECTED
COCAINE: POSITIVE — AB
Opiates: NOT DETECTED
Tetrahydrocannabinol: NOT DETECTED

## 2017-12-01 LAB — ETHANOL: ALCOHOL ETHYL (B): 367 mg/dL — AB (ref <10)

## 2017-12-01 MED ORDER — VITAMIN B-1 100 MG PO TABS
100.0000 mg | ORAL_TABLET | Freq: Once | ORAL | Status: AC
  Administered 2017-12-01: 100 mg via ORAL
  Filled 2017-12-01: qty 1

## 2017-12-01 MED ORDER — LORAZEPAM 2 MG/ML IJ SOLN: 0.0000 mg | Freq: Two times a day (BID) | INTRAMUSCULAR | Status: DC

## 2017-12-01 MED ORDER — LORAZEPAM 1 MG PO TABS: 0.0000 mg | ORAL_TABLET | Freq: Two times a day (BID) | ORAL | Status: DC

## 2017-12-01 MED ORDER — LORAZEPAM 1 MG PO TABS: 0.0000 mg | ORAL_TABLET | Freq: Four times a day (QID) | ORAL | Status: DC

## 2017-12-01 MED ORDER — LORAZEPAM 2 MG/ML IJ SOLN
0.0000 mg | Freq: Four times a day (QID) | INTRAMUSCULAR | Status: DC
  Administered 2017-12-02: 2 mg via INTRAVENOUS
  Filled 2017-12-01: qty 1

## 2017-12-01 NOTE — ED Provider Notes (Signed)
[facility] DEPT Provider Note   CSN: [id number] Arrival date & time: 12/01/17  1918     History   Chief Complaint Chief Complaint  Patient presents with  . Drug Problem    HPI Semaje Kinker is a 52 y.o. male with history of hypertension, seizures, EtOH abuse, homelessness, is here for evaluation of seizures.  Patient reports "back-to-back" seizures today, approximately 3.  States he fell and hit his head, but denies headache.  He describes them as whole body shaking.  Afterwards he feels confused and tired.  States he has long history of seizures but "nobody will give me medicine".  He is having daily seizures.  Reports heavy use of alcohol today but cannot tell me how much.  He cannot tell me when the last time he had alcohol today was.  Additionally, reports low back pain onset 1 week ago after a fall, non radiating.  States he tripped and fell backwards landing on his buttocks and he has had moderate back pain, constant, worse with movement.  Has not tried any over-the-counter analgesics for it.  He denies loss of sensation or heaviness or weakness to his extremities.  No incontinence. Patient does not report any other concerns today, when asked directly about changes in his mood he states he has had thoughts of "ending it all".  States he would probably use a knife to slit his wrists.  Has had aggressive/homicidal thoughts towards 1 person.  Has been having auditory hallucinations that he states he hears when he is asleep.  He cannot determine if he is having a dream.  HPI  Past Medical History:  Diagnosis Date  . Alcoholic hepatitis   . Depression   . ETOH abuse   . Hypertension   . Pancreatitis   . Seizures ([place])   . Suicidal behavior     Patient Active Problem List   Diagnosis Date Noted  . Substance induced mood disorder ([place]) 07/16/2017  . Severe recurrent major depression without psychotic features ([place]) 05/15/2017  . Anemia 03/18/2017  .  Acute blood loss anemia 02/21/2017  . UGI bleed 02/08/2017  . Alcohol withdrawal ([place]) 02/08/2017  . Cocaine abuse ([place]) 11/18/2016  . GI bleed 11/01/2016  . Alcohol abuse   . Major depressive disorder, recurrent severe without psychotic features ([place]) 07/06/2016  . Alcohol-induced mood disorder ([place]) 06/15/2016  . Seizure ([place]) 05/26/2016  . Tobacco abuse 05/26/2016  . Homeless 05/26/2016  . Essential hypertension 05/26/2016  . Alcohol dependence with alcohol-induced mood disorder ([place]) 02/14/2016  . Severe alcohol withdrawal without perceptual disturbances without complication ([place]) 61/60/7371  . Alcoholic hepatitis without ascites 02/14/2016  . Thrombocytopenia ([place]) 02/14/2016    Past Surgical History:  Procedure Laterality Date  . ESOPHAGOGASTRODUODENOSCOPY (EGD) WITH PROPOFOL N/A 11/02/2016   Procedure: ESOPHAGOGASTRODUODENOSCOPY (EGD) WITH PROPOFOL;  Surgeon: Carol Ada, MD;  Location: [facility] ENDOSCOPY;  Service: Endoscopy;  Laterality: N/A;  . HERNIA REPAIR    . LEG SURGERY          Home Medications    Prior to Admission medications   Medication Sig Start Date End Date Taking? Authorizing Provider  acetaminophen (TYLENOL) 325 MG tablet Take 2 tablets (650 mg total) by mouth every 6 (six) hours as needed. 11/17/17   Horton, Barbette Hair, MD  chlordiazePOXIDE (LIBRIUM) 25 MG capsule 50mg  PO TID x 1D, then 25-50mg  PO BID X 1D, then 25-50mg  PO QD X 1D Patient not taking: Reported on 10/06/2017 09/29/17   Deno Etienne, DO  citalopram (CELEXA) 20 MG tablet Take 1 tablet (20 mg total) by mouth daily. For mood control Patient not taking: Reported on 09/17/2017 07/18/17   Money, Lowry Ram, FNP  gabapentin (NEURONTIN) 300 MG capsule Take 1 capsule (300 mg total) by mouth 3 (three) times daily. For withdrawal symptoms Patient not taking: Reported on 11/23/2017 07/18/17   Money, Lowry Ram, FNP  naltrexone (DEPADE) 50 MG tablet Take 1 tablet (50 mg total) by mouth daily. For cravings Patient not  taking: Reported on 11/23/2017 07/19/17   Money, Lowry Ram, FNP  ondansetron (ZOFRAN ODT) 4 MG disintegrating tablet Take 1 tablet (4 mg total) by mouth every 8 (eight) hours as needed for nausea or vomiting. 09/12/17   Quintella Reichert, MD  traZODone (DESYREL) 100 MG tablet Take 1 tablet (100 mg total) by mouth at bedtime. For insomnia 07/18/17   Money, Lowry Ram, FNP    Family History Family History  Problem Relation Age of Onset  . Diabetes Mother   . Alcoholism Mother   . Emphysema Father   . Lung cancer Father   . Alcoholism Father     Social History Social History   Tobacco Use  . Smoking status: Current Every Day Smoker    Packs/day: 1.00    Types: Cigarettes  . Smokeless tobacco: Never Used  Substance Use Topics  . Alcohol use: Yes    Comment: 4-5 40oz beers daily  . Drug use: Yes    Frequency: 3.0 times per week    Types: Cocaine     Allergies   Tomato and Aspirin   Review of Systems Review of Systems  Musculoskeletal: Positive for back pain.  Neurological: Positive for seizures.  Psychiatric/Behavioral: Positive for suicidal ideas.  All other systems reviewed and are negative.    Physical Exam Updated Vital Signs BP (!) 156/92   Pulse 98   SpO2 99%   Physical Exam  Constitutional: He is oriented to person, place, and time. He appears well-developed and well-nourished. No distress.  NAD.  Unkept, strong malodor.  In hall bed.  HENT:  Head: Normocephalic and atraumatic.  Right Ear: External ear normal.  Left Ear: External ear normal.  Nose: Nose normal.  Edentulous. MMM. No intraoral or tongue injury or bleed. No facial or scalp tenderness.   Eyes: Conjunctivae are normal. No scleral icterus.  PERRL and EOM intact   Neck: Normal range of motion. Neck supple.  C-spine: No midline tenderness.  Moves neck spontaneously without any obvious discomfort.  Cardiovascular: Normal rate, regular rhythm and normal heart sounds.  Pulmonary/Chest: Effort normal and  breath sounds normal.  Abdominal: Soft. There is no tenderness.  Musculoskeletal: Normal range of motion. He exhibits no deformity.  T-spine: No midline tenderness.  No paraspinal muscle tenderness.  L-spine: Diffuse, low midline tenderness with overlying approximately 1 cm abrasion.  Diffuse paraspinal muscle tenderness.  Pelvis: No AP/lateral instability with compression.  Full PROM of hips without pain.  Neurological: He is alert and oriented to person, place, and time.  Alert and oriented to self, place, time and event.  Speech is fluent without obvious dysarthria or dysphasia. Strength 5/5 with hand grip and ankle F/E.   Sensation to light touch intact in hands and feet. CN I and VIII not tested. CN II-XII grossly intact bilaterally.   Skin: Skin is warm and dry. Capillary refill takes less than 2 seconds.  Psychiatric: He has a normal mood and affect. His behavior is normal. Judgment and thought content normal.  Nursing note and vitals reviewed.    ED Treatments / Results  Labs (all labs ordered are listed, but only abnormal results are displayed) Labs Reviewed  CBC WITH DIFFERENTIAL/PLATELET - Abnormal; Notable for the following components:      Result Value   WBC 3.0 (*)    RBC 4.03 (*)    Hemoglobin 9.6 (*)    HCT 31.1 (*)    MCV 77.2 (*)    MCH 23.8 (*)    RDW 17.6 (*)    Platelets 127 (*)    Neutro Abs 1.1 (*)    All other components within normal limits  COMPREHENSIVE METABOLIC PANEL - Abnormal; Notable for the following components:   Glucose, Bld 112 (*)    BUN 5 (*)    Calcium 8.5 (*)    AST 153 (*)    ALT 51 (*)    Alkaline Phosphatase 132 (*)    All other components within normal limits  LIPASE, BLOOD - Abnormal; Notable for the following components:   Lipase 59 (*)    All other components within normal limits  ETHANOL - Abnormal; Notable for the following components:   Alcohol, Ethyl (B) 367 (*)    All other components within normal limits  RAPID  URINE DRUG SCREEN, HOSP PERFORMED - Abnormal; Notable for the following components:   Cocaine POSITIVE (*)    All other components within normal limits    EKG None  Radiology Dg Lumbar Spine Complete  Result Date: 12/01/2017 CLINICAL DATA:  Tripped and fell landing on buttocks several days ago, increasing low back pain since EXAM: LUMBAR SPINE - COMPLETE 4+ VIEW COMPARISON:  11/16/2017 FINDINGS: Five non-rib-bearing lumbar vertebra. Low normal osseous mineralization for technique. Disc space narrowing L4-L5 and L5-S1. Vertebral body and remaining disc space heights maintained. No acute fracture, subluxation, or bone destruction. No spondylolysis. SI joints preserved. IMPRESSION: Mild degenerative disc disease changes at lower lumbar spine. No acute abnormalities. Electronically Signed   By: Lavonia Dana M.D.   On: 12/01/2017 21:37   Ct Head Wo Contrast  Result Date: 12/01/2017 CLINICAL DATA:  Three seizures today, slight tremors, less alcohol today than normal, history of hypertension, seizure disorder, ethanol abuse, smoker EXAM: CT HEAD WITHOUT CONTRAST TECHNIQUE: Contiguous axial images were obtained from the base of the skull through the vertex without intravenous contrast. Sagittal and coronal MPR images reconstructed from axial data set. COMPARISON:  11/06/2017 FINDINGS: Brain: Generalized atrophy. Normal ventricular morphology. No midline shift or mass effect. Minimal small vessel chronic ischemic changes of periventricular white matter. No intracranial hemorrhage, mass lesion, or evidence of acute infarction. No extra-axial fluid collections. Vascular: Mild atherosclerotic calcification of internal carotid arteries at skull base Skull: Intact Sinuses/Orbits: Clear Other: N/A IMPRESSION: Atrophy with minimal small vessel chronic ischemic changes of deep cerebral white matter. No acute intracranial abnormalities. Electronically Signed   By: Lavonia Dana M.D.   On: 12/01/2017 21:46     Procedures Procedures (including critical care time)  Medications Ordered in ED Medications  LORazepam (ATIVAN) injection 0-4 mg (0 mg Intravenous Not Given 12/01/17 2319)    Or  LORazepam (ATIVAN) tablet 0-4 mg ( Oral See Alternative 12/01/17 2319)  LORazepam (ATIVAN) injection 0-4 mg (has no administration in time range)    Or  LORazepam (ATIVAN) tablet 0-4 mg (has no administration in time range)  thiamine (VITAMIN B-1) tablet 100 mg (100 mg Oral Given 12/01/17 2254)     Initial Impression / Assessment and Plan / ED  Course  I have reviewed the triage vital signs and the nursing notes.  Pertinent labs & imaging results that were available during my care of the patient were reviewed by me and considered in my medical decision making (see chart for details).  Clinical Course as of Dec 02 2319  Thu Dec 01, 2017  2157 AST(!): 153 [CG]  2158 ALT(!): 51 [CG]  2158 Alkaline Phosphatase(!): 132 [CG]    Clinical Course User Index [CG] Kinnie Feil, PA-C    52 year old with long history of EtOH abuse, polysubstance abuse and seizures is here for seizures in setting of acute EtOH intoxication.  Has been in the ER for similar presentations frequently.  Reports 3 seizures today with subsequent fall.  No obvious head injury on exam, no focal neuro deficits however given risk CT head obtained which was negative.  He had midline L spine tenderness with negative L spine x-rays.  No signs of cauda equina.  He is ambulatory without any weakness.  Labs are at baseline including elevated LFTs.  EtOH elevated.  Positive cocaine in urine.  Initial CIWA score equals 5.  He has been placed on seizure precautions and CIWA protocol.  No witnessed seizure-like activity in the ER.  Chart review shows patient has documented seizures from alcohol intake but no previous antiepileptics prescription that I can find.  I have reevaluated patient and he is in no apparent distress, he is asleep without obvious  tremors.  He is tolerating oral fluids for oral hydration.  Given reported SI with plan, HI. TTS consult ordered and pending.  Patient will be handed off to oncoming ED PA at shift change who will follow-up on details recommendations, reassess.  He is medically cleared and deemed appropriate for discharge once clinically sober, if cleared by TTS.  Final Clinical Impressions(s) / ED Diagnoses   Final diagnoses:  Polysubstance abuse ([place])  Acute alcoholic intoxication in alcoholism with complication [facility])  Suicidal ideation    ED Discharge Orders    None       Arlean Hopping 12/01/17 2321    Dorie Rank, MD 12/02/17 0003

## 2017-12-01 NOTE — ED Notes (Signed)
Bed: [facility] Expected date:  Expected time:  Means of arrival:  Comments: 

## 2017-12-01 NOTE — ED Triage Notes (Signed)
PT BIB [facility] from [address]. Pt stated that he has had 3 seizures today. EMS reports that the pt remembers the events and has slight tremors. Pt has not had as much alcohol as normal today. Pt does admit to Cocaine use today. Pt is homeless. 20 ga IV left hand. Pt CAOx4.

## 2017-12-02 ENCOUNTER — Other Ambulatory Visit: Payer: Self-pay

## 2017-12-02 NOTE — Patient Outreach (Signed)
ED Peer Support Specialist Patient Intake (Complete at intake & 30-60 Day Follow-up)  Name: Barry Moore  MRN: [id number]  Age: 52 y.o.   Date of Admission: 12/02/2017  Intake: Initial Comments:      Primary Reason Admitted: PT BIB [facility] from [address]. Pt stated that he has had 3 seizures today. EMS reports that the pt remembers the events and has slight tremors. Pt has not had as much alcohol as normal today. Pt does admit to Cocaine use today. Pt is homeless. 20 ga IV left hand. Pt CAOx4.  Lab values: Alcohol/ETOH: Positive Positive UDS? Yes Amphetamines: No Barbiturates: No Benzodiazepines: No Cocaine:   Opiates:   Cannabinoids: No  Demographic information: Gender: Male Ethnicity: White Marital Status: Single Insurance Status: Uninsured/Self-pay Ecologist (Work Neurosurgeon, Physicist, medical, Social research officer, government.: Yes(Food Public librarian ) Lives with: Alone Living situation: Homeless  Reported Patient History: Patient reported health conditions: Depression, Anxiety disorders Patient aware of HIV and hepatitis status: No  In past year, has patient visited ED for any reason? Yes  Number of ED visits: 15  Reason(s) for visit: Same situation   In past year, has patient been hospitalized for any reason? No  Number of hospitalizations:    Reason(s) for hospitalization:    In past year, has patient been arrested? No  Number of arrests:    Reason(s) for arrest:    In past year, has patient been incarcerated? No  Number of incarcerations:    Reason(s) for incarceration:    In past year, has patient received medication-assisted treatment? No  In past year, patient received the following treatments:    In past year, has patient received any harm reduction services? No  Did this include any of the following?    In past year, has patient received care from a mental health provider for diagnosis other than SUD? No  In past year, is this first time  patient has overdosed? No  Number of past overdoses:    In past year, is this first time patient has been hospitalized for an overdose? No  Number of hospitalizations for overdose(s):    Is patient currently receiving treatment for a mental health diagnosis? No  Patient reports experiencing difficulty participating in SUD treatment: No    Most important reason(s) for this difficulty?    Has patient received prior services for treatment? No  In past, patient has received services from following agencies:    Plan of Care:  Suggested follow up at these agencies/treatment centers: (Seeking assistance at [facility] )  Other information: Pt stated that he was doing better and also addressed the fact that he is always at the hospital because of his drinking. CPSS discussed the importance of Pt making sure that he follows up with [facility] and his caseworker on what Pt stated he was seeking with there services. CPSS left contact information so that Pt can stay in contact in the community.    Aaron Edelman Ladasha Schnackenberg, CPSS  12/02/2017 2:32 PM

## 2017-12-02 NOTE — ED Provider Notes (Signed)
  Physical Exam  BP (!) 157/93 (BP Location: Left Arm)   Pulse 78   Temp 99 F (37.2 C) (Oral)   Resp (!) 22   SpO2 100%   Physical Exam  ED Course/Procedures   Clinical Course as of Dec 02 1453  Thu Dec 01, 2017  2157 AST(!): 153 [CG]  2158 ALT(!): 51 [CG]  2158 Alkaline Phosphatase(!): 132 [CG]    Clinical Course User Index [CG] Kinnie Feil, PA-C    Procedures  MDM  Patient cleared by psych. He is homeless and is given bus pass. Stable for discharge. Clinically sober.    Drenda Freeze, MD 12/02/17 [phone]

## 2017-12-02 NOTE — Progress Notes (Signed)
Patient ID: Barry Moore, male   DOB: 1965-04-08, 52 y.o.   MRN: [id number]   Patient has chronic homicidal and suicidal ideations with no intent, no past attempts.  Substance abuse is his issue and needs to meet with Peer Support, not psych team as he has had multiple admissions and resources but continues not to use these.  He is at his baseline.  Waylan Boga, PMHNP

## 2017-12-02 NOTE — Discharge Instructions (Signed)
See a Social worker. Avoid drinking alcohol or doing drugs.   See your doctor  Return to ER if you have hallucinations, thoughts of harming yourself or others, hallucinations.

## 2017-12-02 NOTE — ED Provider Notes (Signed)
Patient signed out to me at shift change.  Patient has sobered up.  Has reported SI and HI.  Wants to "end it all."  Also aggressive and homicidal toward one person.   Medically clear.  TTS evaluation pending.   Montine Circle, PA-C 12/02/17 [phone]

## 2017-12-11 ENCOUNTER — Emergency Department (HOSPITAL_COMMUNITY)
Admission: EM | Admit: 2017-12-11 | Discharge: 2017-12-12 | Disposition: A | Discharge status: Home / Self Care | Payer: Self-pay | Attending: Emergency Medicine | Admitting: Emergency Medicine

## 2017-12-11 ENCOUNTER — Other Ambulatory Visit: Payer: Self-pay

## 2017-12-11 ENCOUNTER — Encounter (HOSPITAL_COMMUNITY): Payer: Self-pay | Admitting: Emergency Medicine

## 2017-12-11 DIAGNOSIS — F10920 Alcohol use, unspecified with intoxication, uncomplicated: Secondary | ICD-10-CM

## 2017-12-11 DIAGNOSIS — F17210 Nicotine dependence, cigarettes, uncomplicated: Secondary | ICD-10-CM | POA: Insufficient documentation

## 2017-12-11 DIAGNOSIS — F1010 Alcohol abuse, uncomplicated: Secondary | ICD-10-CM

## 2017-12-11 DIAGNOSIS — Z79899 Other long term (current) drug therapy: Secondary | ICD-10-CM | POA: Insufficient documentation

## 2017-12-11 DIAGNOSIS — K2920 Alcoholic gastritis without bleeding: Secondary | ICD-10-CM | POA: Insufficient documentation

## 2017-12-11 DIAGNOSIS — F10120 Alcohol abuse with intoxication, uncomplicated: Secondary | ICD-10-CM | POA: Insufficient documentation

## 2017-12-11 DIAGNOSIS — I10 Essential (primary) hypertension: Secondary | ICD-10-CM | POA: Insufficient documentation

## 2017-12-11 LAB — LIPASE, BLOOD: Lipase: 53 U/L — ABNORMAL HIGH (ref 11–51)

## 2017-12-11 LAB — COMPREHENSIVE METABOLIC PANEL
ALBUMIN: 3.9 g/dL (ref 3.5–5.0)
ALT: 48 U/L — ABNORMAL HIGH (ref 0–44)
AST: 130 U/L — AB (ref 15–41)
Alkaline Phosphatase: 142 U/L — ABNORMAL HIGH (ref 38–126)
Anion gap: 9 (ref 5–15)
BILIRUBIN TOTAL: 0.6 mg/dL (ref 0.3–1.2)
BUN: <5 mg/dL — ABNORMAL LOW (ref 6–20)
CO2: 21 mmol/L — AB (ref 22–32)
Calcium: 8.7 mg/dL — ABNORMAL LOW (ref 8.9–10.3)
Chloride: 105 mmol/L (ref 98–111)
Creatinine, Ser: 0.75 mg/dL (ref 0.61–1.24)
GFR calc Af Amer: >60 mL/min (ref >60)
GFR calc non Af Amer: >60 mL/min (ref >60)
GLUCOSE: 99 mg/dL (ref 70–99)
POTASSIUM: 3.5 mmol/L (ref 3.5–5.1)
Sodium: 135 mmol/L (ref 135–145)
TOTAL PROTEIN: 7.4 g/dL (ref 6.5–8.1)

## 2017-12-11 LAB — URINALYSIS, ROUTINE W REFLEX MICROSCOPIC
BILIRUBIN URINE: NEGATIVE
Glucose, UA: NEGATIVE mg/dL
HGB URINE DIPSTICK: NEGATIVE
Ketones, ur: NEGATIVE mg/dL
Leukocytes, UA: NEGATIVE
NITRITE: NEGATIVE
PH: 6 (ref 5.0–8.0)
Protein, ur: NEGATIVE mg/dL
Specific Gravity, Urine: 1.001 — ABNORMAL LOW (ref 1.005–1.030)

## 2017-12-11 LAB — CBC
HEMATOCRIT: 32.8 % — AB (ref 39.0–52.0)
Hemoglobin: 9.9 g/dL — ABNORMAL LOW (ref 13.0–17.0)
MCH: 23.8 pg — AB (ref 26.0–34.0)
MCHC: 30.2 g/dL (ref 30.0–36.0)
MCV: 78.8 fL (ref 78.0–100.0)
Platelets: 155 10*3/uL (ref 150–400)
RBC: 4.16 MIL/uL — ABNORMAL LOW (ref 4.22–5.81)
RDW: 17.5 % — AB (ref 11.5–15.5)
WBC: 4.6 10*3/uL (ref 4.0–10.5)

## 2017-12-11 NOTE — ED Triage Notes (Addendum)
Pt to triage via [facility]> pt found behind an abandoned building and had ? Seizure.  EMS reports pt sitting up talking when they got there.  History of ETOH abuse.  Last ETOH 2 hours ago. C/o pain to lower back and generalized abd pain since earlier today.

## 2017-12-12 ENCOUNTER — Encounter (HOSPITAL_COMMUNITY): Payer: Self-pay | Admitting: Emergency Medicine

## 2017-12-12 MED ORDER — DICYCLOMINE HCL 10 MG PO CAPS
10.0000 mg | ORAL_CAPSULE | Freq: Once | ORAL | Status: AC
  Administered 2017-12-12: 10 mg via ORAL
  Filled 2017-12-12: qty 1

## 2017-12-12 MED ORDER — GI COCKTAIL ~~LOC~~
30.0000 mL | Freq: Once | ORAL | Status: AC
  Administered 2017-12-12: 30 mL via ORAL
  Filled 2017-12-12: qty 30

## 2017-12-12 MED ORDER — SUCRALFATE 1 GM/10ML PO SUSP: 1.0000 g | Freq: Three times a day (TID) | ORAL | 0 refills | Status: DC

## 2017-12-12 NOTE — ED Notes (Signed)
ED Provider at bedside. 

## 2017-12-12 NOTE — ED Provider Notes (Signed)
[facility]ER[facility]PART[facility] Provider Note   CSN: [id number] Arrival date & time: 12/11/17  2131     History   Chief Complaint Chief Complaint  Patient presents with  . Alcohol Problem  . Abdominal Pain  . ? Seizure    HPI Barry Moore is a 52 y.o. male.  The history is provided by the patient.  Alcohol Problem  This is a chronic problem. The current episode started more than 1 week ago. The problem occurs constantly. The problem has not changed since onset.Associated symptoms include abdominal pain. Pertinent negatives include no chest pain and no shortness of breath. Nothing aggravates the symptoms. Nothing relieves the symptoms. He has tried nothing for the symptoms. The treatment provided no relief.  Abdominal Pain   This is a recurrent problem. The current episode started more than 2 days ago. The problem occurs constantly. The problem has not changed since onset.The pain is associated with an unknown factor. The pain is located in the generalized abdominal region. The quality of the pain is cramping. Pertinent negatives include fever and dysuria.  ETOH abuse drank 160-200 ounces of beer today and has abdominal pain. No f/c/r.  No n/v/d.    Past Medical History:  Diagnosis Date  . Alcoholic hepatitis   . Depression   . ETOH abuse   . Hypertension   . Pancreatitis   . Seizures ([place])   . Suicidal behavior     Patient Active Problem List   Diagnosis Date Noted  . Substance induced mood disorder ([place]) 07/16/2017  . Severe recurrent major depression without psychotic features ([place]) 05/15/2017  . Anemia 03/18/2017  . Acute blood loss anemia 02/21/2017  . UGI bleed 02/08/2017  . Alcohol withdrawal ([place]) 02/08/2017  . Cocaine abuse ([place]) 11/18/2016  . GI bleed 11/01/2016  . Alcohol abuse   . Major depressive disorder, recurrent severe without psychotic features ([place]) 07/06/2016  . 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 Seizure ([place])  05/26/2016  . Tobacco abuse 05/26/2016  . Homeless 05/26/2016  . Essential hypertension 05/26/2016  . Alcohol dependence with alcohol-induced mood disorder ([place]) 02/14/2016  . Severe alcohol withdrawal without perceptual disturbances without complication ([place]) 32/44/0102  . Alcoholic hepatitis without ascites 02/14/2016  . Thrombocytopenia ([place]) 02/14/2016    Past Surgical History:  Procedure Laterality Date  . ESOPHAGOGASTRODUODENOSCOPY (EGD) WITH PROPOFOL N/A 11/02/2016   Procedure: ESOPHAGOGASTRODUODENOSCOPY (EGD) WITH PROPOFOL;  Surgeon: Carol Ada, MD;  Location: [facility] END[facility];  Service: Endoscopy;  Laterality: N/A;  . HERNIA REPAIR    . LEG SURGERY          Home Medications    Prior to Admission medications   Medication Sig Start Date End Date Taking? Authorizing Provider  acetaminophen (TYLENOL) 325 MG tablet Take 2 tablets (650 mg total) by mouth every 6 (six) hours as needed. Patient not taking: Reported on 12/12/2017 11/17/17   Horton, Barbette Hair, MD  chlordiazePOXIDE (LIBRIUM) 25 MG capsule 50mg  PO TID x 1D, then 25-50mg  PO BID X 1D, then 25-50mg  PO QD X 1D Patient not taking: Reported on 10/06/2017 09/29/17   Deno Etienne, DO  citalopram (CELEXA) 20 MG tablet Take 1 tablet (20 mg total) by mouth daily. For mood control Patient not taking: Reported on 09/17/2017 07/18/17   Money, Lowry Ram, FNP  gabapentin (NEURONTIN) 300 MG capsule Take 1 capsule (300 mg total) by mouth 3 (three) times daily. For withdrawal symptoms Patient not taking: Reported on 11/23/2017 07/18/17   Money, Lowry Ram,  FNP  naltrexone (DEPADE) 50 MG tablet Take 1 tablet (50 mg total) by mouth daily. For cravings Patient not taking: Reported on 11/23/2017 07/19/17   Money, Lowry Ram, FNP  ondansetron (ZOFRAN ODT) 4 MG disintegrating tablet Take 1 tablet (4 mg total) by mouth every 8 (eight) hours as needed for nausea or vomiting. Patient not taking: Reported on 12/12/2017 09/12/17   Quintella Reichert, MD  traZODone  (DESYREL) 100 MG tablet Take 1 tablet (100 mg total) by mouth at bedtime. For insomnia Patient not taking: Reported on 12/12/2017 07/18/17   Money, Lowry Ram, FNP    Family History Family History  Problem Relation Age of Onset  . Diabetes Mother   . Alcoholism Mother   . Emphysema Father   . Lung cancer Father   . Alcoholism Father     Social History Social History   Tobacco Use  . Smoking status: Current Every Day Smoker    Packs/day: 1.00    Types: Cigarettes  . Smokeless tobacco: Never Used  Substance Use Topics  . Alcohol use: Yes    Comment: 4-5 40oz beers daily  . Drug use: Yes    Frequency: 3.0 times per week    Types: Cocaine     Allergies   Tomato and Aspirin   Review of Systems Review of Systems  Constitutional: Negative for appetite change and fever.  Respiratory: Negative for shortness of breath.   Cardiovascular: Negative for chest pain.  Gastrointestinal: Positive for abdominal pain.  Genitourinary: Negative for dysuria and flank pain.  Musculoskeletal: Negative for neck pain and neck stiffness.  Neurological: Negative for light-headedness.  Psychiatric/Behavioral: Negative for decreased concentration, dysphoric mood, hallucinations, self-injury and suicidal ideas.  All other systems reviewed and are negative.    Physical Exam Updated Vital Signs BP 121/78 (BP Location: Right Arm)   Pulse (!) 105   Temp 98 F (36.7 C) (Oral)   Resp 16   SpO2 99%   Physical Exam  Constitutional: He is oriented to person, place, and time. He appears well-developed and well-nourished. No distress.  HENT:  Head: Normocephalic and atraumatic.  Nose: Nose normal.  Mouth/Throat: No oropharyngeal exudate.  Eyes: Pupils are equal, round, and reactive to light. Conjunctivae are normal.  Neck: Normal range of motion. Neck supple.  Cardiovascular: Normal rate, regular rhythm, normal heart sounds and intact distal pulses.  Pulmonary/Chest: Effort normal and breath  sounds normal. No stridor. He has no wheezes. He has no rales.  Abdominal: Soft. Bowel sounds are normal. He exhibits no mass. There is no tenderness. There is no rebound and no guarding.  Musculoskeletal: Normal range of motion.  Neurological: He is alert and oriented to person, place, and time. He displays normal reflexes.  Skin: Skin is warm and dry. Capillary refill takes less than 2 seconds.  Psychiatric: He has a normal mood and affect.     ED Treatments / Results  Labs (all labs ordered are listed, but only abnormal results are displayed) Labs Reviewed  LIPASE, BLOOD - Abnormal; Notable for the following components:      Result Value   Lipase 53 (*)    All other components within normal limits  COMPREHENSIVE METABOLIC PANEL - Abnormal; Notable for the following components:   CO2 21 (*)    BUN <5 (*)    Calcium 8.7 (*)    AST 130 (*)    ALT 48 (*)    Alkaline Phosphatase 142 (*)    All other components within normal  limits  CBC - Abnormal; Notable for the following components:   RBC 4.16 (*)    Hemoglobin 9.9 (*)    HCT 32.8 (*)    MCH 23.8 (*)    RDW 17.5 (*)    All other components within normal limits  URINALYSIS, ROUTINE W REFLEX MICROSCOPIC - Abnormal; Notable for the following components:   Color, Urine COLORLESS (*)    Specific Gravity, Urine 1.001 (*)    All other components within normal limits     Radiology No results found.  Procedures Procedures (including critical care time)  Medications Ordered in ED Medications  gi cocktail (Maalox,Lidocaine,Donnatal) (has no administration in time range)  dicyclomine (BENTYL) capsule 10 mg (has no administration in time range)      Final Clinical Impressions(s) / ED Diagnoses  Exam and vitals are benign and reassuring.  Patient PO challenged successfully.    Return for fevers >100.4 unrelieved by medication, shortness of breath, intractable vomiting, or diarrhea, Inability to tolerate liquids or food,  cough, altered mental status or any concerns. No signs of systemic illness or infection. The patient is nontoxic-appearing on exam and vital signs are within normal limits.   I have reviewed the triage vital signs and the nursing notes. Pertinent labs &imaging results that were available during my care of the patient were reviewed by me and considered in my medical decision making (see chart for details).  After history, exam, and medical workup I feel the patient has been appropriately medically screened and is safe for discharge home. Pertinent diagnoses were discussed with the patient. Patient was given return precautions.   Katelyn Kohlmeyer, MD 12/12/17 [phone]

## 2017-12-24 ENCOUNTER — Emergency Department (HOSPITAL_COMMUNITY)
Admission: EM | Admit: 2017-12-24 | Discharge: 2017-12-25 | Disposition: A | Discharge status: Home / Self Care | Payer: Self-pay | Attending: Emergency Medicine | Admitting: Emergency Medicine

## 2017-12-24 ENCOUNTER — Encounter (HOSPITAL_COMMUNITY): Payer: Self-pay | Admitting: Emergency Medicine

## 2017-12-24 ENCOUNTER — Other Ambulatory Visit: Payer: Self-pay

## 2017-12-24 DIAGNOSIS — Z79899 Other long term (current) drug therapy: Secondary | ICD-10-CM | POA: Insufficient documentation

## 2017-12-24 DIAGNOSIS — F1910 Other psychoactive substance abuse, uncomplicated: Secondary | ICD-10-CM | POA: Insufficient documentation

## 2017-12-24 DIAGNOSIS — I10 Essential (primary) hypertension: Secondary | ICD-10-CM | POA: Insufficient documentation

## 2017-12-24 DIAGNOSIS — F17210 Nicotine dependence, cigarettes, uncomplicated: Secondary | ICD-10-CM | POA: Insufficient documentation

## 2017-12-24 DIAGNOSIS — R569 Unspecified convulsions: Secondary | ICD-10-CM | POA: Insufficient documentation

## 2017-12-24 LAB — HEPATIC FUNCTION PANEL
ALBUMIN: 3.7 g/dL (ref 3.5–5.0)
ALT: 45 U/L — ABNORMAL HIGH (ref 0–44)
AST: 137 U/L — AB (ref 15–41)
Alkaline Phosphatase: 123 U/L (ref 38–126)
BILIRUBIN DIRECT: 0.3 mg/dL — AB (ref 0.0–0.2)
BILIRUBIN TOTAL: 0.7 mg/dL (ref 0.3–1.2)
Indirect Bilirubin: 0.4 mg/dL (ref 0.3–0.9)
Total Protein: 7.3 g/dL (ref 6.5–8.1)

## 2017-12-24 LAB — RAPID URINE DRUG SCREEN, HOSP PERFORMED
AMPHETAMINES: NOT DETECTED
BENZODIAZEPINES: NOT DETECTED
Barbiturates: NOT DETECTED
Cocaine: POSITIVE — AB
Opiates: NOT DETECTED
TETRAHYDROCANNABINOL: NOT DETECTED

## 2017-12-24 LAB — LIPASE, BLOOD: Lipase: 48 U/L (ref 11–51)

## 2017-12-24 LAB — ETHANOL: ALCOHOL ETHYL (B): 375 mg/dL — AB (ref <10)

## 2017-12-24 MED ORDER — SODIUM CHLORIDE 0.9 % IV BOLUS
500.0000 mL | Freq: Once | INTRAVENOUS | Status: AC
  Administered 2017-12-24: 500 mL via INTRAVENOUS
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
## 2017-12-25 LAB — BASIC METABOLIC PANEL
Anion gap: 12 (ref 5–15)
BUN: <5 mg/dL — ABNORMAL LOW (ref 6–20)
CALCIUM: 8.2 mg/dL — AB (ref 8.9–10.3)
CO2: 20 mmol/L — AB (ref 22–32)
CREATININE: 0.64 mg/dL (ref 0.61–1.24)
Chloride: 108 mmol/L (ref 98–111)
GFR calc non Af Amer: >60 mL/min (ref >60)
Glucose, Bld: 86 mg/dL (ref 70–99)
Potassium: 3.5 mmol/L (ref 3.5–5.1)
Sodium: 140 mmol/L (ref 135–145)

## 2017-12-25 LAB — CBC
HCT: 34.5 % — ABNORMAL LOW (ref 39.0–52.0)
Hemoglobin: 10.3 g/dL — ABNORMAL LOW (ref 13.0–17.0)
MCH: 23.7 pg — ABNORMAL LOW (ref 26.0–34.0)
MCHC: 29.9 g/dL — ABNORMAL LOW (ref 30.0–36.0)
MCV: 79.3 fL (ref 78.0–100.0)
PLATELETS: 115 10*3/uL — AB (ref 150–400)
RBC: 4.35 MIL/uL (ref 4.22–5.81)
RDW: 17.3 % — ABNORMAL HIGH (ref 11.5–15.5)
WBC: 3.9 10*3/uL — AB (ref 4.0–10.5)

## 2017-12-25 NOTE — ED Provider Notes (Signed)
Patient observed overnight and has had no additional seizure-like activity.  His blood alcohol is markedly elevated.  He is now awake and alert and eating.  At this point I feel as though he is appropriate for discharge.   Veryl Speak, MD 12/25/17 [phone]

## 2018-02-04 ENCOUNTER — Encounter (HOSPITAL_COMMUNITY): Payer: Self-pay

## 2018-02-04 ENCOUNTER — Emergency Department (HOSPITAL_COMMUNITY)
Admission: EM | Admit: 2018-02-04 | Discharge: 2018-02-05 | Disposition: A | Discharge status: Home / Self Care | Payer: Self-pay | Attending: Emergency Medicine | Admitting: Emergency Medicine

## 2018-02-04 DIAGNOSIS — F17210 Nicotine dependence, cigarettes, uncomplicated: Secondary | ICD-10-CM | POA: Insufficient documentation

## 2018-02-04 DIAGNOSIS — F1010 Alcohol abuse, uncomplicated: Secondary | ICD-10-CM

## 2018-02-04 DIAGNOSIS — I10 Essential (primary) hypertension: Secondary | ICD-10-CM | POA: Insufficient documentation

## 2018-02-04 DIAGNOSIS — F10929 Alcohol use, unspecified with intoxication, unspecified: Secondary | ICD-10-CM | POA: Insufficient documentation

## 2018-02-04 DIAGNOSIS — F10920 Alcohol use, unspecified with intoxication, uncomplicated: Secondary | ICD-10-CM

## 2018-02-04 LAB — BASIC METABOLIC PANEL
Anion gap: 10 (ref 5–15)
BUN: <5 mg/dL — ABNORMAL LOW (ref 6–20)
CALCIUM: 8.5 mg/dL — AB (ref 8.9–10.3)
CO2: 22 mmol/L (ref 22–32)
CREATININE: 0.62 mg/dL (ref 0.61–1.24)
Chloride: 107 mmol/L (ref 98–111)
GFR calc non Af Amer: >60 mL/min (ref >60)
Glucose, Bld: 95 mg/dL (ref 70–99)
Potassium: 3.3 mmol/L — ABNORMAL LOW (ref 3.5–5.1)
Sodium: 139 mmol/L (ref 135–145)

## 2018-02-04 LAB — CBC
HEMATOCRIT: 33 % — AB (ref 39.0–52.0)
Hemoglobin: 9.9 g/dL — ABNORMAL LOW (ref 13.0–17.0)
MCH: 23.8 pg — ABNORMAL LOW (ref 26.0–34.0)
MCHC: 30 g/dL (ref 30.0–36.0)
MCV: 79.3 fL — ABNORMAL LOW (ref 80.0–100.0)
Platelets: 118 10*3/uL — ABNORMAL LOW (ref 150–400)
RBC: 4.16 MIL/uL — ABNORMAL LOW (ref 4.22–5.81)
RDW: 17.7 % — AB (ref 11.5–15.5)
WBC: 4.6 10*3/uL (ref 4.0–10.5)
nRBC: 0 % (ref 0.0–0.2)

## 2018-02-04 LAB — ETHANOL: Alcohol, Ethyl (B): 397 mg/dL (ref <10)

## 2018-02-04 NOTE — Discharge Instructions (Addendum)
It was our pleasure to provide your ER care today - we hope that you feel better.  Do not drink alcohol - follow up with AA, and use resource guide provided for additional alcohol treatment options.   Follow up with primary care doctor in the coming week.  Return to ER if worse, new symptoms, new or severe pain, fevers, trouble breathing, other concern.

## 2018-02-04 NOTE — ED Notes (Signed)
Pt up taking off monitoring and becoming agitated. States "I am trying to be nice"

## 2018-02-04 NOTE — ED Provider Notes (Signed)
[facility] EMERGENCY DEPARTMENT Provider Note   CSN: [id number] Arrival date & time: 02/04/18  1825     History   Chief Complaint Chief Complaint  Patient presents with  . Alcohol Intoxication    HPI Barry Moore is a 52 y.o. male.  Patient with hx etoh abuse, presents vis EMS/[facility] after being called to a local [employer] where patient was noted to be intoxicated, bottle of liquor in hand, and becoming argumentative/belligerent.  Due to his belligerent behavior, swinging arms, etc, EMS gave patient haldol 5 mg and versed 5 mg.  No report of trauma or fall. Pt altered due to etoh and meds by EMS - unable to provide history - level 5 caveat.   The history is provided by the EMS personnel and the patient. The history is limited by the condition of the patient.    Past Medical History:  Diagnosis Date  . Alcoholic hepatitis   . Depression   . ETOH abuse   . Hypertension   . Pancreatitis   . Seizures ([place])   . Suicidal behavior     Patient Active Problem List   Diagnosis Date Noted  . Substance induced mood disorder ([place]) 07/16/2017  . Severe recurrent major depression without psychotic features ([place]) 05/15/2017  . Anemia 03/18/2017  . Acute blood loss anemia 02/21/2017  . UGI bleed 02/08/2017  . Alcohol withdrawal ([place]) 02/08/2017  . Cocaine abuse ([place]) 11/18/2016  . GI bleed 11/01/2016  . Alcohol abuse   . 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 Seizure ([place]) 05/26/2016  . Tobacco abuse 05/26/2016  . Homeless 05/26/2016  . Essential hypertension 05/26/2016  . Alcohol dependence with alcohol-induced mood disorder ([place]) 02/14/2016  . Severe alcohol withdrawal without perceptual disturbances without complication ([place]) 83/66/2947  . Alcoholic hepatitis without ascites 02/14/2016  . Thrombocytopenia ([place]) 02/14/2016    Past Surgical History:    Procedure Laterality Date  . ESOPHAGOGASTRODUODENOSCOPY (EGD) WITH PROPOFOL N/A 11/02/2016   Procedure: ESOPHAGOGASTRODUODENOSCOPY (EGD) WITH PROPOFOL;  Surgeon: Carol Ada, MD;  Location: [facility];  Service: Endoscopy;  Laterality: N/A;  . HERNIA REPAIR    . LEG SURGERY          Home Medications    Prior to Admission medications   Medication Sig Start Date End Date Taking? Authorizing Provider  acetaminophen (TYLENOL) 325 MG tablet Take 2 tablets (650 mg total) by mouth every 6 (six) hours as needed. Patient not taking: Reported on 12/12/2017 11/17/17   Horton, Barbette Hair, MD  chlordiazePOXIDE (LIBRIUM) 25 MG capsule 50mg  PO TID x 1D, then 25-50mg  PO BID X 1D, then 25-50mg  PO QD X 1D Patient not taking: Reported on 10/06/2017 09/29/17   Deno Etienne, DO  citalopram (CELEXA) 20 MG tablet Take 1 tablet (20 mg total) by mouth daily. For mood control Patient not taking: Reported on 09/17/2017 07/18/17   Money, Lowry Ram, FNP  gabapentin (NEURONTIN) 300 MG capsule Take 1 capsule (300 mg total) by mouth 3 (three) times daily. For withdrawal symptoms Patient not taking: Reported on 11/23/2017 07/18/17   Money, Lowry Ram, FNP  naltrexone (DEPADE) 50 MG tablet Take 1 tablet (50 mg total) by mouth daily. For cravings Patient not taking: Reported on 11/23/2017 07/19/17   Money, Lowry Ram, FNP  ondansetron (ZOFRAN ODT) 4 MG disintegrating tablet Take 1 tablet (4 mg total) by mouth every 8 (eight) hours as needed for nausea or vomiting. Patient  not taking: Reported on 12/12/2017 09/12/17   Quintella Reichert, MD  sucralfate (CARAFATE) 1 GM/10ML suspension Take 10 mLs (1 g total) by mouth 4 (four) times daily -  with meals and at bedtime. Patient not taking: Reported on 12/24/2017 12/12/17   Palumbo, April, MD  traZODone (DESYREL) 100 MG tablet Take 1 tablet (100 mg total) by mouth at bedtime. For insomnia Patient not taking: Reported on 12/12/2017 07/18/17   Money, Lowry Ram, FNP    Family History Family History   Problem Relation Age of Onset  . Diabetes Mother   . Alcoholism Mother   . Emphysema Father   . Lung cancer Father   . Alcoholism Father     Social History Social History   Tobacco Use  . Smoking status: Current Every Day Smoker    Packs/day: 1.00    Types: Cigarettes  . Smokeless tobacco: Never Used  Substance Use Topics  . Alcohol use: Yes    Comment: 4-5 40oz beers daily  . Drug use: Yes    Frequency: 3.0 times per week    Types: Cocaine     Allergies   Tomato and Aspirin   Review of Systems Review of Systems  Unable to perform ROS: Mental status change  level 5 caveat.   Physical Exam Updated Vital Signs There were no vitals taken for this visit.  Physical Exam  Constitutional: He appears well-developed and well-nourished.  HENT:  Head: Atraumatic.  Mouth/Throat: Oropharynx is clear and moist.  Eyes: Pupils are equal, round, and reactive to light. Conjunctivae are normal.  Neck: Neck supple. No tracheal deviation present.  Cardiovascular: Normal rate, regular rhythm, normal heart sounds and intact distal pulses.  Pulmonary/Chest: Effort normal and breath sounds normal. No accessory muscle usage. No respiratory distress.  Abdominal: Soft. Bowel sounds are normal. He exhibits no distension. There is no tenderness.  Musculoskeletal: He exhibits no edema.  No obvious deformity, sts, or focal tenderness on exam.   Neurological:  Pt drowsy. Moves bilateral extremities purposefully. Not cooperative, does not follow commands.   Skin: Skin is warm and dry.  Psychiatric:  Pt drowsy/sedated.   Nursing note and vitals reviewed.    ED Treatments / Results  Labs (all labs ordered are listed, but only abnormal results are displayed) Results for orders placed or performed during the hospital encounter of 16/10/96  Basic metabolic panel  Result Value Ref Range   Sodium 139 135 - 145 mmol/L   Potassium 3.3 (L) 3.5 - 5.1 mmol/L   Chloride 107 98 - 111 mmol/L    CO2 22 22 - 32 mmol/L   Glucose, Bld 95 70 - 99 mg/dL   BUN <5 (L) 6 - 20 mg/dL   Creatinine, Ser 0.62 0.61 - 1.24 mg/dL   Calcium 8.5 (L) 8.9 - 10.3 mg/dL   GFR calc non Af Amer >60 >60 mL/min   GFR calc Af Amer >60 >60 mL/min   Anion gap 10 5 - 15  CBC  Result Value Ref Range   WBC 4.6 4.0 - 10.5 K/uL   RBC 4.16 (L) 4.22 - 5.81 MIL/uL   Hemoglobin 9.9 (L) 13.0 - 17.0 g/dL   HCT 33.0 (L) 39.0 - 52.0 %   MCV 79.3 (L) 80.0 - 100.0 fL   MCH 23.8 (L) 26.0 - 34.0 pg   MCHC 30.0 30.0 - 36.0 g/dL   RDW 17.7 (H) 11.5 - 15.5 %   Platelets 118 (L) 150 - 400 K/uL   nRBC 0.0  0.0 - 0.2 %  Ethanol  Result Value Ref Range   Alcohol, Ethyl (B) 397 (HH) <10 mg/dL    EKG None  Radiology No results found.  Procedures Procedures (including critical care time)  Medications Ordered in ED Medications - No data to display   Initial Impression / Assessment and Plan / ED Course  I have reviewed the triage vital signs and the nursing notes.  Pertinent labs & imaging results that were available during my care of the patient were reviewed by me and considered in my medical decision making (see chart for details).  Iv ns. Continuous pulse ox and monitor.   Reviewed nursing notes and prior charts for additional history.   Labs reviewed - chem/cbc c/w baseline. etoh very high.   Recheck pt, moves about in stretcher purposefully, +intoxicated. No new c/o.   Given level of etoh intoxication will plan to observe in ED until clinically more sober.   Po fluids.   Recheck, arousable, more awake. No new c/o. Vitals normal. Will continue to observe until more sober.   2311, signed out to Dr Dolly Rias, tentative plan d/c in AM if/when clinically sober.    Final Clinical Impressions(s) / ED Diagnoses   Final diagnoses:  None    ED Discharge Orders    None       Lajean Saver, MD 02/04/18 2311

## 2018-02-04 NOTE — ED Triage Notes (Signed)
To room via EMS.  Friend called EMS, pt was picked up at [facility] for seizure in [place].   EMS gave Haldol 5mg , Versed 5mg  for combativeness.  Pt is resting quietly with eyes closed at this time.   EMS BP 110/72 HR 90's SpO2 95% CBG 112 RR 18-22 Pt has hiccups, per EMS pt had hiccups when they first approached.

## 2018-02-05 NOTE — ED Provider Notes (Signed)
1:48 AM Assumed care from Dr. Ashok Cordia, please see their note for full history, physical and decision making until this point. In brief this is a 52 y.o. year old male who presented to the ED tonight with Alcohol Intoxication     Clinically intoxicated with EtOH near 400. Pending metabolization and clinical sobriety for reevaluation and likely discharge.   Patient ambulates without difficulty. Slightly shaky, but not in obvious withdrawal. Tolerates PO. No complaints. Stable for discharge.   Discharge instructions, including strict return precautions for new or worsening symptoms, given. Patient and/or family verbalized understanding and agreement with the plan as described.   Labs, studies and imaging reviewed by myself and considered in medical decision making if ordered. Imaging interpreted by radiology.  Labs Reviewed  BASIC METABOLIC PANEL - Abnormal; Notable for the following components:      Result Value   Potassium 3.3 (*)    BUN <5 (*)    Calcium 8.5 (*)    All other components within normal limits  CBC - Abnormal; Notable for the following components:   RBC 4.16 (*)    Hemoglobin 9.9 (*)    HCT 33.0 (*)    MCV 79.3 (*)    MCH 23.8 (*)    RDW 17.7 (*)    Platelets 118 (*)    All other components within normal limits  ETHANOL - Abnormal; Notable for the following components:   Alcohol, Ethyl (B) 397 (*)    All other components within normal limits    No orders to display    No follow-ups on file.    Merrily Pew, MD 02/05/18 [phone]

## 2018-02-05 NOTE — ED Notes (Signed)
Candace RN reviewed d/c papers with pt. Pt refused to take papers with him. Pt ambulatory at discharge.

## 2018-03-25 ENCOUNTER — Emergency Department (HOSPITAL_COMMUNITY)
Admission: EM | Admit: 2018-03-25 | Discharge: 2018-03-26 | Disposition: A | Discharge status: Home / Self Care | Payer: Self-pay | Attending: Emergency Medicine | Admitting: Emergency Medicine

## 2018-03-25 ENCOUNTER — Encounter (HOSPITAL_COMMUNITY): Payer: Self-pay

## 2018-03-25 DIAGNOSIS — F1010 Alcohol abuse, uncomplicated: Secondary | ICD-10-CM | POA: Insufficient documentation

## 2018-03-25 DIAGNOSIS — K2921 Alcoholic gastritis with bleeding: Secondary | ICD-10-CM | POA: Insufficient documentation

## 2018-03-25 DIAGNOSIS — F17210 Nicotine dependence, cigarettes, uncomplicated: Secondary | ICD-10-CM | POA: Insufficient documentation

## 2018-03-25 DIAGNOSIS — I10 Essential (primary) hypertension: Secondary | ICD-10-CM | POA: Insufficient documentation

## 2018-03-25 LAB — ABO/RH: ABO/RH(D): O POS

## 2018-03-25 LAB — TYPE AND SCREEN
ABO/RH(D): O POS
Antibody Screen: NEGATIVE

## 2018-03-25 LAB — CBC
HEMATOCRIT: 33.2 % — AB (ref 39.0–52.0)
HEMOGLOBIN: 10.1 g/dL — AB (ref 13.0–17.0)
MCH: 23 pg — ABNORMAL LOW (ref 26.0–34.0)
MCHC: 30.4 g/dL (ref 30.0–36.0)
MCV: 75.5 fL — ABNORMAL LOW (ref 80.0–100.0)
PLATELETS: 78 10*3/uL — AB (ref 150–400)
RBC: 4.4 MIL/uL (ref 4.22–5.81)
RDW: 17 % — ABNORMAL HIGH (ref 11.5–15.5)
WBC: 3.1 10*3/uL — AB (ref 4.0–10.5)
nRBC: 0 % (ref 0.0–0.2)

## 2018-03-25 LAB — COMPREHENSIVE METABOLIC PANEL
ALBUMIN: 3.8 g/dL (ref 3.5–5.0)
ALT: 40 U/L (ref 0–44)
ANION GAP: 11 (ref 5–15)
AST: 140 U/L — AB (ref 15–41)
Alkaline Phosphatase: 110 U/L (ref 38–126)
BILIRUBIN TOTAL: 0.7 mg/dL (ref 0.3–1.2)
BUN: <5 mg/dL — ABNORMAL LOW (ref 6–20)
CO2: 22 mmol/L (ref 22–32)
CREATININE: 0.73 mg/dL (ref 0.61–1.24)
Calcium: 8.8 mg/dL — ABNORMAL LOW (ref 8.9–10.3)
Chloride: 95 mmol/L — ABNORMAL LOW (ref 98–111)
GFR calc Af Amer: >60 mL/min (ref >60)
Glucose, Bld: 112 mg/dL — ABNORMAL HIGH (ref 70–99)
POTASSIUM: 3.5 mmol/L (ref 3.5–5.1)
SODIUM: 128 mmol/L — AB (ref 135–145)
TOTAL PROTEIN: 7.4 g/dL (ref 6.5–8.1)

## 2018-03-25 NOTE — ED Triage Notes (Signed)
Pt had been drinking alcohol as his usual, started vomiting coffee ground emesis and then started having abd pain.

## 2018-03-26 LAB — LIPASE, BLOOD: Lipase: 46 U/L (ref 11–51)

## 2018-03-26 MED ORDER — ONDANSETRON 4 MG PO TBDP: 4.0000 mg | ORAL_TABLET | Freq: Three times a day (TID) | ORAL | 0 refills | Status: AC | PRN

## 2018-03-26 MED ORDER — ONDANSETRON HCL 4 MG/2ML IJ SOLN
4.0000 mg | Freq: Once | INTRAMUSCULAR | Status: AC
  Administered 2018-03-26: 4 mg via INTRAVENOUS
  Filled 2018-03-26: qty 2

## 2018-03-26 MED ORDER — SODIUM CHLORIDE 0.9 % IV BOLUS
1000.0000 mL | Freq: Once | INTRAVENOUS | Status: AC
  Administered 2018-03-26: 1000 mL via INTRAVENOUS

## 2018-03-26 MED ORDER — PANTOPRAZOLE SODIUM 20 MG PO TBEC: 20.0000 mg | DELAYED_RELEASE_TABLET | Freq: Every day | ORAL | 0 refills | Status: DC

## 2018-03-26 MED ORDER — ALUM & MAG HYDROXIDE-SIMETH 200-200-20 MG/5ML PO SUSP
30.0000 mL | Freq: Once | ORAL | Status: AC
  Administered 2018-03-26: 30 mL via ORAL
  Filled 2018-03-26: qty 30

## 2018-03-26 MED ORDER — SUCRALFATE 1 GM/10ML PO SUSP: 1.0000 g | Freq: Three times a day (TID) | ORAL | 0 refills | Status: DC

## 2018-03-26 NOTE — ED Provider Notes (Signed)
[facility] EMERGENCY DEPARTMENT Provider Note  CSN: [id number] Arrival date & time: 03/25/18 1920  Chief Complaint(s) Hematemesis  HPI Barry Moore is a 53 y.o. male past medical history listed below including alcohol abuse.  The history is provided by the patient.  GI Problem  This is a recurrent problem. Episode onset: 8-10 hrs ago. Episode frequency: 2-3 times. Associated symptoms include abdominal pain (LUQ pain). Pertinent negatives include no chest pain, no headaches and no shortness of breath. Nothing aggravates the symptoms. Nothing relieves the symptoms.   Patient denied any melena.  Noted coffee ground emesis. Then dry heaving. Last vomited 8 hrs ago.   Past Medical History Past Medical History:  Diagnosis Date  . Alcoholic hepatitis   . Depression   . ETOH abuse   . Hypertension   . Pancreatitis   . Seizures ([place])   . Suicidal behavior    Patient Active Problem List   Diagnosis Date Noted  . Substance induced mood disorder ([place]) 07/16/2017  . Severe recurrent major depression without psychotic features ([place]) 05/15/2017  . Anemia 03/18/2017  . Acute blood loss anemia 02/21/2017  . UGI bleed 02/08/2017  . Alcohol withdrawal ([place]) 02/08/2017  . Cocaine abuse ([place]) 11/18/2016  . GI bleed 11/01/2016  . Alcohol abuse   . Major depressive disorder, recurrent severe without psychotic features ([place]) 07/06/2016  . 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 Seizure ([place]) 05/26/2016  . Tobacco abuse 05/26/2016  . Homeless 05/26/2016  . Essential hypertension 05/26/2016  . Alcohol dependence with alcohol-induced mood disorder ([place]) 02/14/2016  . Severe alcohol withdrawal without perceptual disturbances without complication ([place]) 63/87/5643  . Alcoholic hepatitis without ascites 02/14/2016  . Thrombocytopenia ([place]) 02/14/2016   Home Medication(s) Prior to Admission medications   Medication Sig Start Date End Date Taking? Authorizing  Provider  ondansetron (ZOFRAN ODT) 4 MG disintegrating tablet Take 1 tablet (4 mg total) by mouth every 8 (eight) hours as needed for up to 3 days for nausea or vomiting. 03/26/18 03/29/18  Fatima Blank, MD  pantoprazole (PROTONIX) 20 MG tablet Take 1 tablet (20 mg total) by mouth daily. 03/26/18   Lulla Linville, Grayce Sessions, MD  sucralfate (CARAFATE) 1 GM/10ML suspension Take 10 mLs (1 g total) by mouth 4 (four) times daily -  with meals and at bedtime. 03/26/18   Fatima Blank, MD                                                                                                                                    Past Surgical History Past Surgical History:  Procedure Laterality Date  . ESOPHAGOGASTRODUODENOSCOPY (EGD) WITH PROPOFOL N/A 11/02/2016   Procedure: ESOPHAGOGASTRODUODENOSCOPY (EGD) WITH PROPOFOL;  Surgeon: Carol Ada, MD;  Location: [facility];  Service: Endoscopy;  Laterality: N/A;  . HERNIA REPAIR    . LEG SURGERY     Family History Family History  Problem Relation Age of  Onset  . Diabetes Mother   . Alcoholism Mother   . Emphysema Father   . Lung cancer Father   . Alcoholism Father     Social History Social History   Tobacco Use  . Smoking status: Current Every Day Smoker    Packs/day: 1.00    Types: Cigarettes  . Smokeless tobacco: Never Used  Substance Use Topics  . Alcohol use: Yes    Comment: 4-5 40oz beers daily  . Drug use: Not Currently    Frequency: 3.0 times per week    Types: Cocaine   Allergies Tomato and Aspirin  Review of Systems Review of Systems  Respiratory: Negative for shortness of breath.   Cardiovascular: Negative for chest pain.  Gastrointestinal: Positive for abdominal pain (LUQ pain).  Neurological: Negative for headaches.   All other systems are reviewed and are negative for acute change except as noted in the HPI  Physical Exam Vital Signs  I have reviewed the triage vital signs BP 134/89   Pulse 85   Temp 98.6 F  (37 C) (Oral)   Resp 18   SpO2 97%   Physical Exam Vitals signs reviewed.  Constitutional:      General: He is not in acute distress.    Appearance: He is well-developed. He is not diaphoretic.  HENT:     Head: Normocephalic and atraumatic.     Jaw: No trismus.     Right Ear: External ear normal.     Left Ear: External ear normal.     Nose: Nose normal.  Eyes:     General: No scleral icterus.    Conjunctiva/sclera: Conjunctivae normal.  Neck:     Musculoskeletal: Normal range of motion.     Trachea: Phonation normal.  Cardiovascular:     Rate and Rhythm: Normal rate and regular rhythm.  Pulmonary:     Effort: Pulmonary effort is normal. No respiratory distress.     Breath sounds: No stridor.  Abdominal:     General: There is no distension.     Tenderness: There is abdominal tenderness in the left upper quadrant. There is no guarding or rebound.  Musculoskeletal: Normal range of motion.  Neurological:     Mental Status: He is alert and oriented to person, place, and time.  Psychiatric:        Behavior: Behavior normal.     ED Results and Treatments Labs (all labs ordered are listed, but only abnormal results are displayed) Labs Reviewed  COMPREHENSIVE METABOLIC PANEL - Abnormal; Notable for the following components:      Result Value   Sodium 128 (*)    Chloride 95 (*)    Glucose, Bld 112 (*)    BUN <5 (*)    Calcium 8.8 (*)    AST 140 (*)    All other components within normal limits  CBC - Abnormal; Notable for the following components:   WBC 3.1 (*)    Hemoglobin 10.1 (*)    HCT 33.2 (*)    MCV 75.5 (*)    MCH 23.0 (*)    RDW 17.0 (*)    Platelets 78 (*)    All other components within normal limits  LIPASE, BLOOD  POC OCCULT BLOOD, ED  TYPE AND SCREEN  ABO/RH  EKG  EKG Interpretation  Date/Time:    Ventricular Rate:    PR  Interval:    QRS Duration:   QT Interval:    QTC Calculation:   R Axis:     Text Interpretation:        Radiology No results found. Pertinent labs & imaging results that were available during my care of the patient were reviewed by me and considered in my medical decision making (see chart for details).  Medications Ordered in ED Medications  sodium chloride 0.9 % bolus 1,000 mL (0 mLs Intravenous Stopped 03/26/18 0410)  ondansetron (ZOFRAN) injection 4 mg (4 mg Intravenous Given 03/26/18 0308)  alum & mag hydroxide-simeth (MAALOX/MYLANTA) 200-200-20 MG/5ML suspension 30 mL (30 mLs Oral Given 03/26/18 0308)                                                                                                                                    Procedures Procedures  (including critical care time)  Medical Decision Making / ED Course I have reviewed the nursing notes for this encounter and the patient's prior records (if available in EHR or on provided paperwork).    Patient presented with left upper quadrant abdominal pain and coffee-ground emesis.  Work-up reassuring without leukocytosis and stable hemoglobin.  Metabolic panel did reveal mild electrolyte derangements including mild hyponatremia and hypochloremia.  No significant renal insufficiency.  No evidence of bili obstruction or pancreatitis.  Patient was treated symptomatically with IV fluids antiemetics and GI cocktail.  Patient reported that he was still going to drink.  Not appropriate for Librium.  We will provide prescription for PPIs and nausea medicine.  The patient appears reasonably screened and/or stabilized for discharge and I doubt any other medical condition or other [facility] requiring further screening, evaluation, or treatment in the ED at this time prior to discharge.  The patient is safe for discharge with strict return precautions.   Final Clinical Impression(s) / ED Diagnoses Final diagnoses:  Acute alcoholic  gastritis with hemorrhage   Disposition: Discharge  Condition: Good  I have discussed the results, Dx and Tx plan with the patient who expressed understanding and agree(s) with the plan. Discharge instructions discussed at great length. The patient was given strict return precautions who verbalized understanding of the instructions. No further questions at time of discharge.    ED Discharge Orders         Ordered    ondansetron (ZOFRAN ODT) 4 MG disintegrating tablet  Every 8 hours PRN     03/26/18 0506    pantoprazole (PROTONIX) 20 MG tablet  Daily     03/26/18 0506    sucralfate (CARAFATE) 1 GM/10ML suspension  3 times daily with meals & bedtime     03/26/18 0506           Follow Up: Primary care provider  Schedule an appointment as soon as possible for a visit  If you do not have a primary care physician, contact [facility] at [phone] for referral      This chart was dictated using voice recognition software.  Despite best efforts to proofread,  errors can occur which can change the documentation meaning.   Fatima Blank, MD 03/26/18 [phone]

## 2018-03-26 NOTE — ED Notes (Signed)
Pt discharged from ED; instructions provided and scripts given; Pt encouraged to return to ED if symptoms worsen and to f/u with PCP; Pt verbalized understanding of all instructions 

## 2018-05-24 ENCOUNTER — Emergency Department (HOSPITAL_COMMUNITY)
Admission: EM | Admit: 2018-05-24 | Discharge: 2018-05-24 | Disposition: A | Discharge status: Home / Self Care | Payer: Self-pay | Attending: Emergency Medicine | Admitting: Emergency Medicine

## 2018-05-24 ENCOUNTER — Emergency Department (HOSPITAL_COMMUNITY): Payer: Self-pay

## 2018-05-24 ENCOUNTER — Encounter (HOSPITAL_COMMUNITY): Payer: Self-pay | Admitting: Emergency Medicine

## 2018-05-24 ENCOUNTER — Other Ambulatory Visit: Payer: Self-pay

## 2018-05-24 DIAGNOSIS — Z79899 Other long term (current) drug therapy: Secondary | ICD-10-CM | POA: Insufficient documentation

## 2018-05-24 DIAGNOSIS — R109 Unspecified abdominal pain: Secondary | ICD-10-CM

## 2018-05-24 DIAGNOSIS — I10 Essential (primary) hypertension: Secondary | ICD-10-CM | POA: Insufficient documentation

## 2018-05-24 DIAGNOSIS — Y908 Blood alcohol level of 240 mg/100 ml or more: Secondary | ICD-10-CM | POA: Insufficient documentation

## 2018-05-24 DIAGNOSIS — R1084 Generalized abdominal pain: Secondary | ICD-10-CM | POA: Insufficient documentation

## 2018-05-24 DIAGNOSIS — R079 Chest pain, unspecified: Secondary | ICD-10-CM | POA: Insufficient documentation

## 2018-05-24 DIAGNOSIS — F17210 Nicotine dependence, cigarettes, uncomplicated: Secondary | ICD-10-CM | POA: Insufficient documentation

## 2018-05-24 DIAGNOSIS — F1010 Alcohol abuse, uncomplicated: Secondary | ICD-10-CM | POA: Insufficient documentation

## 2018-05-24 DIAGNOSIS — R110 Nausea: Secondary | ICD-10-CM | POA: Insufficient documentation

## 2018-05-24 LAB — COMPREHENSIVE METABOLIC PANEL
ALT: 30 U/L (ref 0–44)
AST: 155 U/L — ABNORMAL HIGH (ref 15–41)
Albumin: 3.4 g/dL — ABNORMAL LOW (ref 3.5–5.0)
Alkaline Phosphatase: 175 U/L — ABNORMAL HIGH (ref 38–126)
Anion gap: 9 (ref 5–15)
BUN: <5 mg/dL — ABNORMAL LOW (ref 6–20)
CO2: 24 mmol/L (ref 22–32)
Calcium: 8.4 mg/dL — ABNORMAL LOW (ref 8.9–10.3)
Chloride: 101 mmol/L (ref 98–111)
Creatinine, Ser: 0.69 mg/dL (ref 0.61–1.24)
GFR calc Af Amer: >60 mL/min (ref >60)
GFR calc non Af Amer: >60 mL/min (ref >60)
Glucose, Bld: 99 mg/dL (ref 70–99)
Potassium: 3.6 mmol/L (ref 3.5–5.1)
Sodium: 134 mmol/L — ABNORMAL LOW (ref 135–145)
Total Bilirubin: 0.6 mg/dL (ref 0.3–1.2)
Total Protein: 8.6 g/dL — ABNORMAL HIGH (ref 6.5–8.1)

## 2018-05-24 LAB — TYPE AND SCREEN
ABO/RH(D): O POS
Antibody Screen: NEGATIVE

## 2018-05-24 LAB — I-STAT TROPONIN, ED: TROPONIN I, POC: 0 ng/mL (ref 0.00–0.08)

## 2018-05-24 LAB — LIPASE, BLOOD: Lipase: 44 U/L (ref 11–51)

## 2018-05-24 LAB — CBC WITH DIFFERENTIAL/PLATELET
Abs Immature Granulocytes: 0.02 10*3/uL (ref 0.00–0.07)
Basophils Absolute: 0.1 10*3/uL (ref 0.0–0.1)
Basophils Relative: 1 %
EOS PCT: 1 %
Eosinophils Absolute: 0.1 10*3/uL (ref 0.0–0.5)
HCT: 33.1 % — ABNORMAL LOW (ref 39.0–52.0)
HEMOGLOBIN: 10.1 g/dL — AB (ref 13.0–17.0)
Immature Granulocytes: 0 %
Lymphocytes Relative: 27 %
Lymphs Abs: 1.4 10*3/uL (ref 0.7–4.0)
MCH: 24.3 pg — ABNORMAL LOW (ref 26.0–34.0)
MCHC: 30.5 g/dL (ref 30.0–36.0)
MCV: 79.8 fL — ABNORMAL LOW (ref 80.0–100.0)
Monocytes Absolute: 0.6 10*3/uL (ref 0.1–1.0)
Monocytes Relative: 11 %
Neutro Abs: 3 10*3/uL (ref 1.7–7.7)
Neutrophils Relative %: 60 %
Platelets: 104 10*3/uL — ABNORMAL LOW (ref 150–400)
RBC: 4.15 MIL/uL — ABNORMAL LOW (ref 4.22–5.81)
RDW: 17.8 % — ABNORMAL HIGH (ref 11.5–15.5)
WBC: 5.1 10*3/uL (ref 4.0–10.5)
nRBC: 0 % (ref 0.0–0.2)

## 2018-05-24 LAB — ETHANOL: Alcohol, Ethyl (B): 378 mg/dL (ref <10)

## 2018-05-24 LAB — POC OCCULT BLOOD, ED: Fecal Occult Bld: POSITIVE — AB

## 2018-05-24 MED ORDER — SODIUM CHLORIDE 0.9 % IV BOLUS
1000.0000 mL | Freq: Once | INTRAVENOUS | Status: AC
  Administered 2018-05-24: 1000 mL via INTRAVENOUS

## 2018-05-24 MED ORDER — LORAZEPAM 2 MG/ML IJ SOLN: 1.0000 mg | Freq: Once | INTRAMUSCULAR | Status: DC | PRN

## 2018-05-24 MED ORDER — OMEPRAZOLE 20 MG PO CPDR: 20.0000 mg | DELAYED_RELEASE_CAPSULE | Freq: Every day | ORAL | 0 refills | Status: DC

## 2018-05-24 MED ORDER — ALUM & MAG HYDROXIDE-SIMETH 200-200-20 MG/5ML PO SUSP
30.0000 mL | Freq: Once | ORAL | Status: AC
  Administered 2018-05-24: 30 mL via ORAL
  Filled 2018-05-24: qty 30

## 2018-05-24 MED ORDER — ONDANSETRON HCL 4 MG/2ML IJ SOLN
4.0000 mg | Freq: Once | INTRAMUSCULAR | Status: AC
  Administered 2018-05-24: 4 mg via INTRAVENOUS
  Filled 2018-05-24: qty 2

## 2018-05-24 MED ORDER — IOHEXOL 300 MG/ML  SOLN
100.0000 mL | Freq: Once | INTRAMUSCULAR | Status: AC | PRN
  Administered 2018-05-24: 100 mL via INTRAVENOUS

## 2018-05-24 NOTE — ED Triage Notes (Signed)
Patient arrived from home reporting abdominal pain, black stools, nausea. Denies vomiting  Patient endorses alcohol use. Last drink was this morning, he states he drank "2 forties."  States he normally drink "8 forties a day."

## 2018-05-24 NOTE — ED Notes (Signed)
Patient transported to CT 

## 2018-05-24 NOTE — ED Notes (Signed)
New blood tubes sent to lab as requested

## 2018-05-24 NOTE — ED Notes (Signed)
Patient transported to X-ray 

## 2018-05-24 NOTE — ED Notes (Signed)
Patient verbalizes understanding of discharge instructions. Opportunity for questioning and answers were provided. Armband removed by staff, pt discharged from ED.  

## 2018-05-24 NOTE — Discharge Instructions (Addendum)
Please follow up with [facility] if you need a PCP. Otherwise please follow up with your regular doctor regarding symptoms.  Information for [facility] given on discharge paperwork for you to follow up with.  Please take Prilosec as prescribed

## 2018-05-24 NOTE — ED Provider Notes (Signed)
[facility] EMERGENCY DEPARTMENT Provider Note   CSN: [id number] Arrival date & time: 05/24/18  1112    History   Chief Complaint Chief Complaint  Patient presents with  . Abdominal Pain  . Melena    HPI Barry Moore is a 53 y.o. male.     Pt presents to the ED complaining of gradual onset, constant, worsening, diffuse abdominal pain that began this morning shortly after waking up. He also complains of nausea and diarrhea with 5-6 episodes of watery stools today. Pt states that his stools have been watery with flecks of dark black stool. He also states he has noticed bright red blood on the toilet paper after wiping although denies rectal pain with bowel movements. Pt drank 2-3 40 ounces of beer earlier this morning prior to the pain; states that he was having mild right sided chest pain and shortness of breath while drinking but it resolved after 30 minutes. Pt typically drinks 8 40 ounces per day; has a significant hx for alcoholic hepatitis, alcohol abuse, and pancreatitis. Pt has not taken anything for his pain or symptoms today. No recent foreign travel, suspicious food intake, or antibiotic use. Denies hematemesis, coffee ground emesis, constipation, or any other associated symptoms.   Pt reports during exam that he has been having consistent hiccups for the past week that will not dissipate. He states he goes to sleep with them and wakes up with them. He has never had this before.   The history is provided by the patient.    Past Medical History:  Diagnosis Date  . Alcoholic hepatitis   . Depression   . ETOH abuse   . Hypertension   . Pancreatitis   . Seizures ([place])   . Suicidal behavior     Patient Active Problem List   Diagnosis Date Noted  . Substance induced mood disorder ([place]) 07/16/2017  . Severe recurrent major depression without psychotic features ([place]) 05/15/2017  . Anemia 03/18/2017  . Acute blood loss anemia 02/21/2017  . UGI bleed  02/08/2017  . Alcohol withdrawal ([place]) 02/08/2017  . Cocaine abuse ([place]) 11/18/2016  . GI bleed 11/01/2016  . Alcohol abuse   . 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 Seizure ([place]) 05/26/2016  . Tobacco abuse 05/26/2016  . Homeless 05/26/2016  . Essential hypertension 05/26/2016  . Alcohol dependence with alcohol-induced mood disorder ([place]) 02/14/2016  . Severe alcohol withdrawal without perceptual disturbances without complication ([place]) 97/67/3419  . Alcoholic hepatitis without ascites 02/14/2016  . Thrombocytopenia ([place]) 02/14/2016    Past Surgical History:  Procedure Laterality Date  . ESOPHAGOGASTRODUODENOSCOPY (EGD) WITH PROPOFOL N/A 11/02/2016   Procedure: ESOPHAGOGASTRODUODENOSCOPY (EGD) WITH PROPOFOL;  Surgeon: Carol Ada, MD;  Location: [facility] ENDOSCOPY;  Service: Endoscopy;  Laterality: N/A;  . HERNIA REPAIR    . LEG SURGERY          Home Medications    Prior to Admission medications   Medication Sig Start Date End Date Taking? Authorizing Provider  pantoprazole (PROTONIX) 20 MG tablet Take 1 tablet (20 mg total) by mouth daily. 03/26/18   Cardama, Grayce Sessions, MD  sucralfate (CARAFATE) 1 GM/10ML suspension Take 10 mLs (1 g total) by mouth 4 (four) times daily -  with meals and at bedtime. 03/26/18   Fatima Blank, MD    Family History Family History  Problem Relation Age of Onset  . Diabetes Mother   . Alcoholism  Mother   . Emphysema Father   . Lung cancer Father   . Alcoholism Father     Social History Social History   Tobacco Use  . Smoking status: Current Every Day Smoker    Packs/day: 1.00    Types: Cigarettes  . Smokeless tobacco: Never Used  Substance Use Topics  . Alcohol use: Yes    Comment: 4-5 40oz beers daily  . Drug use: Not Currently    Frequency: 3.0 times per week    Types: Cocaine     Allergies   Tomato and  Aspirin   Review of Systems Review of Systems  Constitutional: Positive for fever (subjective). Negative for chills.  Respiratory: Positive for shortness of breath.   Cardiovascular: Positive for chest pain. Negative for leg swelling.  Gastrointestinal: Positive for abdominal pain, blood in stool, diarrhea and nausea. Negative for vomiting.  Musculoskeletal: Negative for myalgias.  Skin: Negative for rash.  Neurological: Negative for dizziness and light-headedness.     Physical Exam Updated Vital Signs BP 123/81   Pulse 91   Temp 98.1 F (36.7 C) (Oral)   Resp 17   SpO2 97%   Physical Exam Vitals signs and nursing note reviewed. Exam conducted with a chaperone present.  Constitutional:      Comments: Pt actively having hiccups during exam Disheveled appearance  HENT:     Head: Normocephalic and atraumatic.  Eyes:     Conjunctiva/sclera: Conjunctivae normal.  Neck:     Musculoskeletal: Neck supple.  Cardiovascular:     Rate and Rhythm: Normal rate and regular rhythm.     Heart sounds: No murmur.  Pulmonary:     Effort: Pulmonary effort is normal. No respiratory distress.     Breath sounds: Normal breath sounds.  Abdominal:     General: Abdomen is flat. Bowel sounds are normal. There is no distension.     Palpations: Abdomen is soft.     Tenderness: There is generalized abdominal tenderness. There is no guarding or rebound.     Comments: Unable to fully palpate the abdomen due to pt discomfort  Genitourinary:    Rectum: Normal. Guaiac result positive.     Comments: Pt with dried watery feces to buttock area Skin:    General: Skin is warm and dry.  Neurological:     Mental Status: He is alert.      ED Treatments / Results  Labs (all labs ordered are listed, but only abnormal results are displayed) Labs Reviewed  ETHANOL - Abnormal; Notable for the following components:      Result Value   Alcohol, Ethyl (B) 378 (*)    All other components within normal  limits  CBC WITH DIFFERENTIAL/PLATELET - Abnormal; Notable for the following components:   RBC 4.15 (*)    Hemoglobin 10.1 (*)    HCT 33.1 (*)    MCV 79.8 (*)    MCH 24.3 (*)    RDW 17.8 (*)    Platelets 104 (*)    All other components within normal limits  COMPREHENSIVE METABOLIC PANEL - Abnormal; Notable for the following components:   Sodium 134 (*)    BUN <5 (*)    Calcium 8.4 (*)    Total Protein 8.6 (*)    Albumin 3.4 (*)    AST 155 (*)    Alkaline Phosphatase 175 (*)    All other components within normal limits  POC OCCULT BLOOD, ED - Abnormal; Notable for the following components:   Fecal  Occult Bld POSITIVE (*)    All other components within normal limits  LIPASE, BLOOD  OCCULT BLOOD X 1 CARD TO LAB, STOOL  I-STAT TROPONIN, ED  TYPE AND SCREEN    EKG EKG Interpretation  Date/Time:  Wednesday May 24 2018 11:21:39 EST Ventricular Rate:  85 PR Interval:    QRS Duration: 105 QT Interval:  370 QTC Calculation: 440 R Axis:   67 Text Interpretation:  Sinus rhythm Low voltage, precordial leads Baseline wander in lead(s) I No significant change was found Confirmed by Jola Schmidt [phone]) on 05/24/2018 1:25:07 PM Also confirmed by Jola Schmidt [phone]), editor Philomena Doheny [phone])  on 05/24/2018 1:29:27 PM   Radiology Dg Chest 2 View  Result Date: 05/24/2018 CLINICAL DATA:  Chest pain. EXAM: CHEST - 2 VIEW COMPARISON:  Chest x-ray dated November 06, 2017. FINDINGS: The heart size and mediastinal contours are within normal limits. Normal pulmonary vascularity. No focal consolidation, pleural effusion, or pneumothorax. No acute osseous abnormality. 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 IMPRESSION: No active cardiopulmonary disease. Electronically Signed   By: Titus Dubin M.D.   On: 05/24/2018 12:10   Ct Abdomen Pelvis W Contrast  Result Date: 05/24/2018 CLINICAL DATA:  Abdominal pain EXAM: CT ABDOMEN AND PELVIS WITH CONTRAST TECHNIQUE: Multidetector CT imaging of  the abdomen and pelvis was performed using the standard protocol following bolus administration of intravenous contrast. CONTRAST:  15m OMNIPAQUE IOHEXOL 300 MG/ML  SOLN COMPARISON:  05/06/2017 FINDINGS: Lower chest: No acute abnormality. Hepatobiliary: No focal liver abnormality is seen. Thickening of the gallbladder wall. No radiopaque calculi are identified. No biliary ductal dilatation. Pancreas: Unremarkable. No pancreatic ductal dilatation or surrounding inflammatory changes. Spleen: Normal in size without focal abnormality. Adrenals/Urinary Tract: Adrenal glands are unremarkable. Kidneys are normal, without renal calculi, focal lesion, or hydronephrosis. Distended urinary bladder. Stomach/Bowel: Stomach is within normal limits. Appendix not clearly visualized and may be surgically absent. No evidence of bowel wall thickening, distention, or inflammatory changes. Vascular/Lymphatic: No significant vascular findings are present. No enlarged abdominal or pelvic lymph nodes. Reproductive: No mass or other abnormality. Other: No abdominal wall hernia or abnormality. No abdominopelvic ascites. Musculoskeletal: No acute or significant osseous findings. IMPRESSION: 1. Thickening of the gallbladder wall. No radiopaque calculi are identified. No biliary ductal dilatation. Findings are concerning for acute cholecystitis. Ultrasound may be used to more sensitively evaluate for calculi and HIDA may be used to further evaluate for patency of the cystic and common bile ducts. 2.  Distended urinary bladder.  Correlate for urinary retention. Electronically Signed   By: AEddie CandleM.D.   On: 05/24/2018 14:29   [place]domen Limited Ruq  Result Date: 05/24/2018 CLINICAL DATA:  Abdominal pain for 1 month. EXAM: ULTRASOUND ABDOMEN LIMITED RIGHT UPPER QUADRANT COMPARISON:  CT scan dated 05/24/2018 FINDINGS: Gallbladder: No gallstones. Diffuse edema of the gallbladder wall, thickened to 7.2 mm. Negative sonographic Murphy's  sign. Common bile duct: Diameter: 5.5 mm, normal Liver: No focal lesion identified. Diffuse increased echogenicity of the liver parenchyma consistent with hepatic steatosis. Portal vein is patent on color Doppler imaging with normal direction of blood flow towards the liver. IMPRESSION: 1. Hepatic steatosis. 2. Edema of the gallbladder wall. This may be due to the patient's hypoalbuminemia. Electronically Signed   By: JLorriane ShireM.D.   On: 05/24/2018 15:34    Procedures Procedures (including critical care time)  Medications Ordered in ED Medications  LORazepam (ATIVAN) injection 1 mg (has no administration in time range)  ondansetron (  ZOFRAN) injection 4 mg (4 mg Intravenous Given 05/24/18 1218)  alum & mag hydroxide-simeth (MAALOX/MYLANTA) 200-200-20 MG/5ML suspension 30 mL (30 mLs Oral Given 05/24/18 1218)  sodium chloride 0.9 % bolus 1,000 mL (0 mLs Intravenous Stopped 05/24/18 1400)  iohexol (OMNIPAQUE) 300 MG/ML solution 100 mL (100 mLs Intravenous Contrast Given 05/24/18 1406)     Initial Impression / Assessment and Plan / ED Course  I have reviewed the triage vital signs and the nursing notes.  Pertinent labs & imaging results that were available during my care of the patient were reviewed by me and considered in my medical decision making (see chart for details).    Pt presents with complaints of generalized abdominal pain, nausea, and melena. He also endorses chest pain and shortness of breath x 30 minutes while drinking this morning - states he drank 2-3 40 ounces of beer this morning PTA. Pt with significant hx of alcohol abuse, pancreatitis, and alcoholic hepatitis. Will get baseline labs including CBC, CMP to check LFTs, Lipase, and EtOH. ACS workup initiated as well given complaints. Rectal exam done in room with chaperone present; stool does not appear black in nature although sent to lab for analysis. GI cocktail given as well as zofran and PRN ativan given pt's hx. Will await labs  prior to initiating any imaging.   11:44 AM Guaiac positive lab result.   12:12 PM Upon reeval pt states he only noticed the dark stools this AM. He had similar symptoms in 2018 when he needed a blood transfusion; reports he has been feeling lightheaded this AM and had a near syncopal episode earlier today while standing. No LOC, no head injury. Will give 1 L NS bolus for symptom relief. CT A/P also ordered as pt is exquisitely tender diffusely; cannot fully palpate due to pain although pt asking for food during re check.   1:01 PM CBC with mildly decreased H&H of 10.1/33.3; chart review with similar values 1 month ago; no need for blood transfusion at this time.   1:23 PM CMP with elevated AST of 155; appears to be patient's baseline. Alk phos elevated at 175; likely elevated due to alcohol abuse.   2:47 PM CT A/P with findings of thickening of the gallbladder wall. Will get RUQ U/S to rule out cholecystitis. Updated patient on plan; he is still asking for food. States his pain has subsided with GI cocktail. Likely CT scan is showing cirrhosis.   3:38 PM U/S with findings of alcoholic steatosis and edema of gallbladder wall that may be due to pt's hypoalbuminemia. Low suspicion of acute cholecystitis given pt's presentation; negative ultrasonic murphy's sign; and pt asking for food during entirety of hospital stay. Although pt reports melena, stool was light brown with rectal exam, guaiac positive. Pt endorses mild rectal pain with defecation; suspect hemorrhoids vs GI bleed. Denies coffee ground emesis or hematemesis. Pt likely experiencing abdominal pain due to drinking vs GERD which he is supposed to take protonix for; will discharge home with Rx for Prilosec given cost.       Final Clinical Impressions(s) / ED Diagnoses   Final diagnoses:  Abdominal pain    ED Discharge Orders    None       Eustaquio Maize, PA-C 05/24/18 1615    Jola Schmidt, MD 05/26/18 [phone]

## 2018-08-04 ENCOUNTER — Inpatient Hospital Stay (HOSPITAL_COMMUNITY)
Admission: EM | Admit: 2018-08-04 | Discharge: 2018-08-09 | DRG: 378 | Disposition: A | Discharge status: Home / Self Care | Payer: Self-pay | Attending: Family Medicine | Admitting: Family Medicine

## 2018-08-04 ENCOUNTER — Encounter (HOSPITAL_COMMUNITY): Payer: Self-pay | Admitting: Emergency Medicine

## 2018-08-04 ENCOUNTER — Emergency Department (HOSPITAL_COMMUNITY): Payer: Self-pay

## 2018-08-04 ENCOUNTER — Other Ambulatory Visit: Payer: Self-pay

## 2018-08-04 DIAGNOSIS — R112 Nausea with vomiting, unspecified: Secondary | ICD-10-CM

## 2018-08-04 DIAGNOSIS — K08109 Complete loss of teeth, unspecified cause, unspecified class: Secondary | ICD-10-CM | POA: Diagnosis present

## 2018-08-04 DIAGNOSIS — B3781 Candidal esophagitis: Secondary | ICD-10-CM | POA: Diagnosis present

## 2018-08-04 DIAGNOSIS — I8501 Esophageal varices with bleeding: Secondary | ICD-10-CM | POA: Diagnosis present

## 2018-08-04 DIAGNOSIS — E876 Hypokalemia: Secondary | ICD-10-CM | POA: Diagnosis present

## 2018-08-04 DIAGNOSIS — K264 Chronic or unspecified duodenal ulcer with hemorrhage: Principal | ICD-10-CM | POA: Diagnosis present

## 2018-08-04 DIAGNOSIS — Z915 Personal history of self-harm: Secondary | ICD-10-CM

## 2018-08-04 DIAGNOSIS — Z91018 Allergy to other foods: Secondary | ICD-10-CM

## 2018-08-04 DIAGNOSIS — K633 Ulcer of intestine: Secondary | ICD-10-CM

## 2018-08-04 DIAGNOSIS — K648 Other hemorrhoids: Secondary | ICD-10-CM | POA: Diagnosis present

## 2018-08-04 DIAGNOSIS — K2270 Barrett's esophagus without dysplasia: Secondary | ICD-10-CM | POA: Diagnosis present

## 2018-08-04 DIAGNOSIS — K635 Polyp of colon: Secondary | ICD-10-CM

## 2018-08-04 DIAGNOSIS — K222 Esophageal obstruction: Secondary | ICD-10-CM | POA: Diagnosis present

## 2018-08-04 DIAGNOSIS — D649 Anemia, unspecified: Secondary | ICD-10-CM | POA: Diagnosis present

## 2018-08-04 DIAGNOSIS — Y908 Blood alcohol level of 240 mg/100 ml or more: Secondary | ICD-10-CM | POA: Diagnosis present

## 2018-08-04 DIAGNOSIS — K922 Gastrointestinal hemorrhage, unspecified: Secondary | ICD-10-CM | POA: Diagnosis present

## 2018-08-04 DIAGNOSIS — D509 Iron deficiency anemia, unspecified: Secondary | ICD-10-CM | POA: Diagnosis present

## 2018-08-04 DIAGNOSIS — K760 Fatty (change of) liver, not elsewhere classified: Secondary | ICD-10-CM | POA: Diagnosis present

## 2018-08-04 DIAGNOSIS — K766 Portal hypertension: Secondary | ICD-10-CM | POA: Diagnosis present

## 2018-08-04 DIAGNOSIS — R7401 Elevation of levels of liver transaminase levels: Secondary | ICD-10-CM

## 2018-08-04 DIAGNOSIS — E871 Hypo-osmolality and hyponatremia: Secondary | ICD-10-CM | POA: Diagnosis present

## 2018-08-04 DIAGNOSIS — F1024 Alcohol dependence with alcohol-induced mood disorder: Secondary | ICD-10-CM | POA: Diagnosis present

## 2018-08-04 DIAGNOSIS — I10 Essential (primary) hypertension: Secondary | ICD-10-CM | POA: Diagnosis present

## 2018-08-04 DIAGNOSIS — F10239 Alcohol dependence with withdrawal, unspecified: Secondary | ICD-10-CM | POA: Diagnosis not present

## 2018-08-04 DIAGNOSIS — K7010 Alcoholic hepatitis without ascites: Secondary | ICD-10-CM | POA: Diagnosis present

## 2018-08-04 DIAGNOSIS — K3189 Other diseases of stomach and duodenum: Secondary | ICD-10-CM | POA: Diagnosis present

## 2018-08-04 DIAGNOSIS — F17210 Nicotine dependence, cigarettes, uncomplicated: Secondary | ICD-10-CM | POA: Diagnosis present

## 2018-08-04 DIAGNOSIS — Q438 Other specified congenital malformations of intestine: Secondary | ICD-10-CM

## 2018-08-04 DIAGNOSIS — Z801 Family history of malignant neoplasm of trachea, bronchus and lung: Secondary | ICD-10-CM

## 2018-08-04 DIAGNOSIS — Z811 Family history of alcohol abuse and dependence: Secondary | ICD-10-CM

## 2018-08-04 DIAGNOSIS — G8929 Other chronic pain: Secondary | ICD-10-CM | POA: Diagnosis present

## 2018-08-04 DIAGNOSIS — F1490 Cocaine use, unspecified, uncomplicated: Secondary | ICD-10-CM | POA: Diagnosis present

## 2018-08-04 DIAGNOSIS — R109 Unspecified abdominal pain: Secondary | ICD-10-CM

## 2018-08-04 DIAGNOSIS — Z590 Homelessness: Secondary | ICD-10-CM

## 2018-08-04 DIAGNOSIS — F10939 Alcohol use, unspecified with withdrawal, unspecified: Secondary | ICD-10-CM | POA: Diagnosis present

## 2018-08-04 DIAGNOSIS — Z1159 Encounter for screening for other viral diseases: Secondary | ICD-10-CM

## 2018-08-04 DIAGNOSIS — Z720 Tobacco use: Secondary | ICD-10-CM | POA: Diagnosis present

## 2018-08-04 DIAGNOSIS — D696 Thrombocytopenia, unspecified: Secondary | ICD-10-CM | POA: Diagnosis present

## 2018-08-04 DIAGNOSIS — Z886 Allergy status to analgesic agent status: Secondary | ICD-10-CM

## 2018-08-04 DIAGNOSIS — K644 Residual hemorrhoidal skin tags: Secondary | ICD-10-CM | POA: Diagnosis present

## 2018-08-04 DIAGNOSIS — D61818 Other pancytopenia: Secondary | ICD-10-CM | POA: Diagnosis present

## 2018-08-04 LAB — COMPREHENSIVE METABOLIC PANEL
ALT: 32 U/L (ref 0–44)
AST: 139 U/L — ABNORMAL HIGH (ref 15–41)
Albumin: 3.3 g/dL — ABNORMAL LOW (ref 3.5–5.0)
Alkaline Phosphatase: 137 U/L — ABNORMAL HIGH (ref 38–126)
Anion gap: 15 (ref 5–15)
BUN: <5 mg/dL — ABNORMAL LOW (ref 6–20)
CO2: 17 mmol/L — ABNORMAL LOW (ref 22–32)
Calcium: 8.3 mg/dL — ABNORMAL LOW (ref 8.9–10.3)
Chloride: 105 mmol/L (ref 98–111)
Creatinine, Ser: 0.66 mg/dL (ref 0.61–1.24)
GFR calc Af Amer: >60 mL/min (ref >60)
GFR calc non Af Amer: >60 mL/min (ref >60)
Glucose, Bld: 98 mg/dL (ref 70–99)
Potassium: 3.8 mmol/L (ref 3.5–5.1)
Sodium: 137 mmol/L (ref 135–145)
Total Bilirubin: 1.1 mg/dL (ref 0.3–1.2)
Total Protein: 7.9 g/dL (ref 6.5–8.1)

## 2018-08-04 LAB — CBC WITH DIFFERENTIAL/PLATELET
Abs Immature Granulocytes: 0.02 10*3/uL (ref 0.00–0.07)
Basophils Absolute: 0.1 10*3/uL (ref 0.0–0.1)
Basophils Relative: 2 %
Eosinophils Absolute: 0.1 10*3/uL (ref 0.0–0.5)
Eosinophils Relative: 2 %
HCT: 30.4 % — ABNORMAL LOW (ref 39.0–52.0)
Hemoglobin: 9.7 g/dL — ABNORMAL LOW (ref 13.0–17.0)
Immature Granulocytes: 1 %
Lymphocytes Relative: 40 %
Lymphs Abs: 1.4 10*3/uL (ref 0.7–4.0)
MCH: 24.6 pg — ABNORMAL LOW (ref 26.0–34.0)
MCHC: 31.9 g/dL (ref 30.0–36.0)
MCV: 77.2 fL — ABNORMAL LOW (ref 80.0–100.0)
Monocytes Absolute: 0.5 10*3/uL (ref 0.1–1.0)
Monocytes Relative: 14 %
Neutro Abs: 1.4 10*3/uL — ABNORMAL LOW (ref 1.7–7.7)
Neutrophils Relative %: 41 %
Platelets: 80 10*3/uL — ABNORMAL LOW (ref 150–400)
RBC: 3.94 MIL/uL — ABNORMAL LOW (ref 4.22–5.81)
RDW: 17.2 % — ABNORMAL HIGH (ref 11.5–15.5)
WBC: 3.4 10*3/uL — ABNORMAL LOW (ref 4.0–10.5)
nRBC: 0 % (ref 0.0–0.2)

## 2018-08-04 LAB — SARS CORONAVIRUS 2 BY RT PCR (HOSPITAL ORDER, PERFORMED IN ~~LOC~~ HOSPITAL LAB): SARS Coronavirus 2: NEGATIVE

## 2018-08-04 LAB — LIPASE, BLOOD: Lipase: 48 U/L (ref 11–51)

## 2018-08-04 LAB — POC OCCULT BLOOD, ED: Fecal Occult Bld: POSITIVE — AB

## 2018-08-04 LAB — TROPONIN I: Troponin I: <0.03 ng/mL (ref <0.03)

## 2018-08-04 MED ORDER — PANTOPRAZOLE SODIUM 40 MG IV SOLR
40.0000 mg | Freq: Once | INTRAVENOUS | Status: AC
  Administered 2018-08-04: 20:00:00 40 mg via INTRAVENOUS
  Filled 2018-08-04: qty 40

## 2018-08-04 MED ORDER — PANTOPRAZOLE SODIUM 40 MG IV SOLR
40.0000 mg | Freq: Once | INTRAVENOUS | Status: AC
  Administered 2018-08-04: 40 mg via INTRAVENOUS
  Filled 2018-08-04: qty 40

## 2018-08-04 MED ORDER — SODIUM CHLORIDE 0.9 % IV BOLUS
1000.0000 mL | Freq: Once | INTRAVENOUS | Status: AC
  Administered 2018-08-04: 20:00:00 1000 mL via INTRAVENOUS

## 2018-08-04 MED ORDER — SODIUM CHLORIDE 0.9 % IV SOLN
8.0000 mg/h | INTRAVENOUS | Status: DC
  Administered 2018-08-04 – 2018-08-05 (×2): 8 mg/h via INTRAVENOUS
  Filled 2018-08-04 (×2): qty 80

## 2018-08-04 MED ORDER — SODIUM CHLORIDE 0.9 % IV SOLN
1.0000 g | Freq: Once | INTRAVENOUS | Status: AC
  Administered 2018-08-04: 23:00:00 1 g via INTRAVENOUS
  Filled 2018-08-04: qty 10

## 2018-08-04 MED ORDER — SODIUM CHLORIDE 0.9 % IV SOLN
50.0000 ug/h | INTRAVENOUS | Status: DC
  Administered 2018-08-04 – 2018-08-05 (×2): 50 ug/h via INTRAVENOUS
  Filled 2018-08-04 (×3): qty 1

## 2018-08-04 MED ORDER — OCTREOTIDE LOAD VIA INFUSION
50.0000 ug | Freq: Once | INTRAVENOUS | Status: DC
  Filled 2018-08-04: qty 25

## 2018-08-04 NOTE — ED Triage Notes (Signed)
Patient in via [facility] from street (homeless) c/o chest pain and lower back x 3 days. Denies shortness of breath or fevers/chills. EMS VS: HR 82 NSR, RR 18, 120/88, 100% RA, temp 97.55F temporal. Resp e/u, skin w/d.

## 2018-08-04 NOTE — ED Provider Notes (Signed)
[facility] Provider Note   CSN: [id number] Arrival date & time: 08/04/18  1703    History   Chief Complaint Chief Complaint  Patient presents with  . Chest Pain    HPI Barry Moore is a 53 y.o. male.     Barry Moore is a 53 y.o. male with a history of alcoholic hepatitis, pancreatitis, seizures, hypertension, and substance abuse, who presents to the emergency department via EMS complaining of chest and abdominal pain for the past 3 days.  Patient is homeless.  He reports that for the past 3 days he has been having epigastric pain radiating up into his chest.  About 4 days ago he had one episode of reported coffee grounds emesis, none since then.  He reports that over the past few days he has noted multiple episodes of dark black stools.  He denies lower abdominal pain.  No bright red blood per rectum.  He has had some intermittent nausea.  Chest pain does not radiate it is not exertional, not associated with any shortness of breath.  He has not had any lightheadedness or syncope.  He does report that he is continued to drink and last drink just prior to arrival.  Also reports cocaine use yesterday, denies any other drugs.     Past Medical History:  Diagnosis Date  . Alcoholic hepatitis   . Depression   . ETOH abuse   . Hypertension   . Pancreatitis   . Seizures ([place])   . Suicidal behavior     Patient Active Problem List   Diagnosis Date Noted  . Substance induced mood disorder ([place]) 07/16/2017  . Severe recurrent major depression without psychotic features ([place]) 05/15/2017  . Anemia 03/18/2017  . Acute blood loss anemia 02/21/2017  . UGI bleed 02/08/2017  . Alcohol withdrawal ([place]) 02/08/2017  . Cocaine abuse ([place]) 11/18/2016  . GI bleed 11/01/2016  . Alcohol abuse   . Major depressive disorder, recurrent severe without psychotic features ([place]) 07/06/2016  . 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 Seizure ([place])  05/26/2016  . Tobacco abuse 05/26/2016  . Homeless 05/26/2016  . Essential hypertension 05/26/2016  . Alcohol dependence with alcohol-induced mood disorder ([place]) 02/14/2016  . Severe alcohol withdrawal without perceptual disturbances without complication ([place]) 33/00/7622  . Alcoholic hepatitis without ascites 02/14/2016  . Thrombocytopenia ([place]) 02/14/2016    Past Surgical History:  Procedure Laterality Date  . ESOPHAGOGASTRODUODENOSCOPY (EGD) WITH PROPOFOL N/A 11/02/2016   Procedure: ESOPHAGOGASTRODUODENOSCOPY (EGD) WITH PROPOFOL;  Surgeon: Carol Ada, MD;  Location: [facility];  Service: Endoscopy;  Laterality: N/A;  . HERNIA REPAIR    . LEG SURGERY          Home Medications    Prior to Admission medications   Medication Sig Start Date End Date Taking? Authorizing Provider  omeprazole (PRILOSEC) 20 MG capsule Take 1 capsule (20 mg total) by mouth daily. Patient not taking: Reported on 08/04/2018 05/24/18   Eustaquio Maize, PA-C  pantoprazole (PROTONIX) 20 MG tablet Take 1 tablet (20 mg total) by mouth daily. Patient not taking: Reported on 08/04/2018 03/26/18   Fatima Blank, MD  sucralfate (CARAFATE) 1 GM/10ML suspension Take 10 mLs (1 g total) by mouth 4 (four) times daily -  with meals and at bedtime. Patient not taking: Reported on 08/04/2018 03/26/18   Fatima Blank, MD    Family History Family History  Problem Relation Age of Onset  . Diabetes Mother   . Alcoholism Mother   .  Emphysema Father   . Lung cancer Father   . Alcoholism Father     Social History Social History   Tobacco Use  . Smoking status: Current Every Day Smoker    Packs/day: 1.00    Types: Cigarettes  . Smokeless tobacco: Never Used  Substance Use Topics  . Alcohol use: Yes    Comment: 4-5 40oz beers daily  . Drug use: Not Currently    Frequency: 3.0 times per week    Types: Cocaine     Allergies   Tomato and Aspirin   Review of Systems Review of Systems   Constitutional: Positive for fatigue. Negative for chills and fever.  HENT: Negative.   Eyes: Negative for visual disturbance.  Respiratory: Negative for cough and shortness of breath.   Cardiovascular: Positive for chest pain. Negative for leg swelling.  Gastrointestinal: Positive for abdominal pain, blood in stool, nausea and vomiting. Negative for constipation and diarrhea.  Genitourinary: Negative for dysuria and frequency.  Musculoskeletal: Negative for arthralgias and myalgias.  Skin: Negative for color change and rash.  Neurological: Negative for dizziness, syncope and light-headedness.     Physical Exam Updated Vital Signs BP 133/81   Pulse 90   Temp 97.8 F (36.6 C) (Oral)   Resp 20   Ht 5\' 7"  (1.702 m)   Wt 79.4 kg   SpO2 100%   BMI 27.41 kg/m   Physical Exam Vitals signs and nursing note reviewed.  Constitutional:      General: He is not in acute distress.    Appearance: He is well-developed and normal weight. He is not diaphoretic.     Comments: Disheveled appearance  HENT:     Head: Normocephalic and atraumatic.  Eyes:     General:        Right eye: No discharge.        Left eye: No discharge.     Pupils: Pupils are equal, round, and reactive to light.  Neck:     Musculoskeletal: Neck supple.  Cardiovascular:     Rate and Rhythm: Normal rate and regular rhythm.     Pulses:          Radial pulses are 2+ on the right side and 2+ on the left side.     Heart sounds: Normal heart sounds. No murmur. No friction rub. No gallop.   Pulmonary:     Effort: Pulmonary effort is normal. No respiratory distress.     Breath sounds: Normal breath sounds. No wheezing or rales.     Comments: Respirations equal and unlabored, patient able to speak in full sentences, lungs clear to auscultation bilaterally Abdominal:     General: Bowel sounds are normal. There is no distension.     Palpations: Abdomen is soft. There is no mass.     Tenderness: There is abdominal  tenderness. There is no guarding.     Comments: Abdomen is soft, nondistended, bowel sounds present throughout, focal tenderness in the epigastrium without guarding or peritoneal signs, all other quadrants nontender to palpation.  Musculoskeletal:        General: No deformity.     Right lower leg: He exhibits no tenderness. No edema.     Left lower leg: He exhibits no tenderness. No edema.  Skin:    General: Skin is warm and dry.     Capillary Refill: Capillary refill takes less than 2 seconds.     Coloration: Skin is pale.  Neurological:     Mental Status: He  is alert.     Coordination: Coordination normal.     Comments: Speech is clear, able to follow commands Moves extremities without ataxia, coordination intact Ambulatory with steady gait  Psychiatric:        Mood and Affect: Mood normal.        Behavior: Behavior normal.      ED Treatments / Results  Labs (all labs ordered are listed, but only abnormal results are displayed) Labs Reviewed  CBC WITH DIFFERENTIAL/PLATELET - Abnormal; Notable for the following components:      Result Value   WBC 3.4 (*)    RBC 3.94 (*)    Hemoglobin 9.7 (*)    HCT 30.4 (*)    MCV 77.2 (*)    MCH 24.6 (*)    RDW 17.2 (*)    Platelets 80 (*)    Neutro Abs 1.4 (*)    All other components within normal limits  COMPREHENSIVE METABOLIC PANEL - Abnormal; Notable for the following components:   CO2 17 (*)    BUN <5 (*)    Calcium 8.3 (*)    Albumin 3.3 (*)    AST 139 (*)    Alkaline Phosphatase 137 (*)    All other components within normal limits  POC OCCULT BLOOD, ED - Abnormal; Notable for the following components:   Fecal Occult Bld POSITIVE (*)    All other components within normal limits  SARS CORONAVIRUS 2 (HOSPITAL ORDER, [facility])  LIPASE, BLOOD  TROPONIN I  URINALYSIS, ROUTINE W REFLEX MICROSCOPIC  PROTIME-INR  TYPE AND SCREEN    EKG EKG Interpretation  Date/Time:  Friday Aug 04 2018  17:09:44 EDT Ventricular Rate:  88 PR Interval:  160 QRS Duration: 96 QT Interval:  368 QTC Calculation: 445 R Axis:   67 Text Interpretation:  Normal sinus rhythm Normal ECG Confirmed by Quintella Reichert [phone]) on 08/04/2018 7:07:50 PM   Radiology Dg Chest Port 1 View  Result Date: 08/04/2018 CLINICAL DATA:  Chest pain EXAM: PORTABLE CHEST 1 VIEW COMPARISON:  May 24, 2018. FINDINGS: No edema or consolidation. Heart size and pulmonary vascularity are normal. No adenopathy. There is evidence of old trauma involving the medial left clavicle with remodeling. No pneumothorax. IMPRESSION: No edema or consolidation.  Stable cardiac silhouette. Electronically Signed   By: Lowella Grip III M.D.   On: 08/04/2018 20:27    Procedures Procedures (including critical care time)  Medications Ordered in ED Medications  pantoprazole (PROTONIX) 80 mg in sodium chloride 0.9 % 250 mL (0.32 mg/mL) infusion (8 mg/hr Intravenous New Bag/Given 08/04/18 2308)  octreotide (SANDOSTATIN) 2 mcg/mL load via infusion 50 mcg (has no administration in time range)    And  octreotide (SANDOSTATIN) 500 mcg in sodium chloride 0.9 % 250 mL (2 mcg/mL) infusion (50 mcg/hr Intravenous New Bag/Given 08/04/18 2321)  sodium chloride 0.9 % bolus 1,000 mL (0 mLs Intravenous Stopped 08/04/18 2048)  pantoprazole (PROTONIX) injection 40 mg (40 mg Intravenous Given 08/04/18 2003)  pantoprazole (PROTONIX) injection 40 mg (40 mg Intravenous Given 08/04/18 2224)  cefTRIAXone (ROCEPHIN) 1 g in sodium chloride 0.9 % 100 mL IVPB (0 g Intravenous Stopped 08/04/18 2317)     Initial Impression / Assessment and Plan / ED Course  I have reviewed the triage vital signs and the nursing notes.  Pertinent labs & imaging results that were available during my care of the patient were reviewed by me and considered in my medical decision making (see chart for  details).  Patient is currently homeless, reports complaining of chest and epigastric pain  for the past few days, reports one episode of coffee-ground emesis is a few days ago and multiple episodes of dark tarry stools.  History of prior GI bleeding.  Known history of alcoholic cirrhosis and is still actively drinking.  On arrival vitals are stable and patient appears to be in no acute distress, mildly intoxicated, but calm and cooperative.  Suspect epigastric pain may be related to alcoholic gastritis or PUD and that patient may be having a GI bleed.  Chest pain seems to be radiating up from the epigastrium and is atypical I have low suspicion for ACS but will check EKG, troponin and chest x-ray.  We will also check basic labs, type and screen and Hemoccult.  EKG is unremarkable, negative troponin and chest x-ray shows no active cardiopulmonary disease, images reviewed by myself agree with radiologist findings.  Patient has chronic pancytopenia's which are slightly worsened today, white count of 3.4, hemoglobin of 9.7 which is slightly lower than baseline and platelets of 80.  Patient is Hemoccult positive stools and had large melenic bowel movement while here in the emergency department, no bright red blood.  LFTs are at baseline, normal renal function, normal lipase.  Patient started on Protonix drip, given that he had large melenic bowel movement here in the emergency department and also has low platelets feel he require admission for observation and likely GI consult.  We will also order COVID test, asymptomatic but necessary for admission.  Case discussed with Dr. Rush Landmark with GI who agrees with plan for admission to medicine, agrees with Protonix drip and Rocephin, and recommends going ahead and starting octreotide since it is been 2 years since his last scope and given his history of alcoholic cirrhosis unsure if this could be a variceal bleed.  Requests ethanol level be checked.  Consult placed for medicine admission.  Case discussed with Dr. Josephine Cables with [facility] who will see  and admit the patient.  Final Clinical Impressions(s) / ED Diagnoses   Final diagnoses:  Gastrointestinal hemorrhage, unspecified gastrointestinal hemorrhage type  Thrombocytopenia ([place])  Alcoholic hepatitis, unspecified whether ascites present    ED Discharge Orders    None       Janet Berlin 08/05/18 Rod Mae, MD 08/08/18 [phone]

## 2018-08-05 ENCOUNTER — Inpatient Hospital Stay (HOSPITAL_COMMUNITY): Payer: Self-pay | Admitting: Certified Registered"

## 2018-08-05 ENCOUNTER — Encounter (HOSPITAL_COMMUNITY)
Admission: EM | Disposition: A | Discharge status: Home / Self Care | Payer: Self-pay | Source: Home / Self Care | Attending: Internal Medicine

## 2018-08-05 ENCOUNTER — Encounter (HOSPITAL_COMMUNITY): Payer: Self-pay | Admitting: Gastroenterology

## 2018-08-05 DIAGNOSIS — R195 Other fecal abnormalities: Secondary | ICD-10-CM

## 2018-08-05 DIAGNOSIS — K209 Esophagitis, unspecified: Secondary | ICD-10-CM

## 2018-08-05 DIAGNOSIS — R7401 Elevation of levels of liver transaminase levels: Secondary | ICD-10-CM

## 2018-08-05 DIAGNOSIS — K922 Gastrointestinal hemorrhage, unspecified: Secondary | ICD-10-CM | POA: Diagnosis present

## 2018-08-05 DIAGNOSIS — K3189 Other diseases of stomach and duodenum: Secondary | ICD-10-CM

## 2018-08-05 DIAGNOSIS — K766 Portal hypertension: Secondary | ICD-10-CM

## 2018-08-05 DIAGNOSIS — R109 Unspecified abdominal pain: Secondary | ICD-10-CM

## 2018-08-05 DIAGNOSIS — R112 Nausea with vomiting, unspecified: Secondary | ICD-10-CM

## 2018-08-05 DIAGNOSIS — I8510 Secondary esophageal varices without bleeding: Secondary | ICD-10-CM

## 2018-08-05 DIAGNOSIS — K709 Alcoholic liver disease, unspecified: Secondary | ICD-10-CM

## 2018-08-05 HISTORY — PX: ESOPHAGOGASTRODUODENOSCOPY (EGD) WITH PROPOFOL: SHX5813

## 2018-08-05 HISTORY — PX: BIOPSY: SHX5522

## 2018-08-05 LAB — PROTIME-INR
INR: 1.2 (ref 0.8–1.2)
Prothrombin Time: 14.7 seconds (ref 11.4–15.2)

## 2018-08-05 LAB — CBC WITH DIFFERENTIAL/PLATELET
Abs Immature Granulocytes: 0 10*3/uL (ref 0.00–0.07)
Basophils Absolute: 0 10*3/uL (ref 0.0–0.1)
Basophils Relative: 2 %
Eosinophils Absolute: 0.1 10*3/uL (ref 0.0–0.5)
Eosinophils Relative: 2 %
HCT: 25.7 % — ABNORMAL LOW (ref 39.0–52.0)
Hemoglobin: 8.1 g/dL — ABNORMAL LOW (ref 13.0–17.0)
Immature Granulocytes: 0 %
Lymphocytes Relative: 32 %
Lymphs Abs: 0.9 10*3/uL (ref 0.7–4.0)
MCH: 24.6 pg — ABNORMAL LOW (ref 26.0–34.0)
MCHC: 31.5 g/dL (ref 30.0–36.0)
MCV: 78.1 fL — ABNORMAL LOW (ref 80.0–100.0)
Monocytes Absolute: 0.4 10*3/uL (ref 0.1–1.0)
Monocytes Relative: 13 %
Neutro Abs: 1.4 10*3/uL — ABNORMAL LOW (ref 1.7–7.7)
Neutrophils Relative %: 51 %
Platelets: 83 10*3/uL — ABNORMAL LOW (ref 150–400)
RBC: 3.29 MIL/uL — ABNORMAL LOW (ref 4.22–5.81)
RDW: 17.2 % — ABNORMAL HIGH (ref 11.5–15.5)
WBC: 2.8 10*3/uL — ABNORMAL LOW (ref 4.0–10.5)
nRBC: 0 % (ref 0.0–0.2)

## 2018-08-05 LAB — URINALYSIS, ROUTINE W REFLEX MICROSCOPIC
Bilirubin Urine: NEGATIVE
Glucose, UA: NEGATIVE mg/dL
Hgb urine dipstick: NEGATIVE
Ketones, ur: NEGATIVE mg/dL
Leukocytes,Ua: NEGATIVE
Nitrite: NEGATIVE
Protein, ur: NEGATIVE mg/dL
Specific Gravity, Urine: 1.012 (ref 1.005–1.030)
pH: 7 (ref 5.0–8.0)

## 2018-08-05 LAB — COMPREHENSIVE METABOLIC PANEL
ALT: 31 U/L (ref 0–44)
AST: 139 U/L — ABNORMAL HIGH (ref 15–41)
Albumin: 3 g/dL — ABNORMAL LOW (ref 3.5–5.0)
Alkaline Phosphatase: 120 U/L (ref 38–126)
Anion gap: 14 (ref 5–15)
BUN: <5 mg/dL — ABNORMAL LOW (ref 6–20)
CO2: 20 mmol/L — ABNORMAL LOW (ref 22–32)
Calcium: 7.7 mg/dL — ABNORMAL LOW (ref 8.9–10.3)
Chloride: 100 mmol/L (ref 98–111)
Creatinine, Ser: 0.71 mg/dL (ref 0.61–1.24)
GFR calc Af Amer: >60 mL/min (ref >60)
GFR calc non Af Amer: >60 mL/min (ref >60)
Glucose, Bld: 107 mg/dL — ABNORMAL HIGH (ref 70–99)
Potassium: 3.7 mmol/L (ref 3.5–5.1)
Sodium: 134 mmol/L — ABNORMAL LOW (ref 135–145)
Total Bilirubin: 1.3 mg/dL — ABNORMAL HIGH (ref 0.3–1.2)
Total Protein: 6.9 g/dL (ref 6.5–8.1)

## 2018-08-05 LAB — IRON AND TIBC
Iron: 14 ug/dL — ABNORMAL LOW (ref 45–182)
Saturation Ratios: 3 % — ABNORMAL LOW (ref 17.9–39.5)
TIBC: 451 ug/dL — ABNORMAL HIGH (ref 250–450)
UIBC: 437 ug/dL

## 2018-08-05 LAB — TYPE AND SCREEN
ABO/RH(D): O POS
Antibody Screen: NEGATIVE

## 2018-08-05 LAB — ETHANOL: Alcohol, Ethyl (B): 196 mg/dL — ABNORMAL HIGH (ref <10)

## 2018-08-05 LAB — FERRITIN: Ferritin: 13 ng/mL — ABNORMAL LOW (ref 24–336)

## 2018-08-05 SURGERY — ESOPHAGOGASTRODUODENOSCOPY (EGD) WITH PROPOFOL
Anesthesia: Monitor Anesthesia Care

## 2018-08-05 MED ORDER — LORAZEPAM 2 MG/ML IJ SOLN: 2.0000 mg | INTRAMUSCULAR | Status: DC | PRN

## 2018-08-05 MED ORDER — PROPOFOL 10 MG/ML IV BOLUS
INTRAVENOUS | Status: DC | PRN
  Administered 2018-08-05 (×5): 20 mg via INTRAVENOUS

## 2018-08-05 MED ORDER — PROPOFOL 500 MG/50ML IV EMUL
INTRAVENOUS | Status: DC | PRN
  Administered 2018-08-05: 100 ug/kg/min via INTRAVENOUS

## 2018-08-05 MED ORDER — ADULT MULTIVITAMIN W/MINERALS CH
1.0000 | ORAL_TABLET | Freq: Every day | ORAL | Status: DC
  Administered 2018-08-05 – 2018-08-09 (×5): 1 via ORAL
  Filled 2018-08-05 (×5): qty 1

## 2018-08-05 MED ORDER — SODIUM CHLORIDE 0.9 % IV SOLN
Freq: Once | INTRAVENOUS | Status: AC
  Administered 2018-08-05: 02:00:00 via INTRAVENOUS

## 2018-08-05 MED ORDER — FOLIC ACID 1 MG PO TABS
1.0000 mg | ORAL_TABLET | Freq: Every day | ORAL | Status: DC
  Administered 2018-08-05 – 2018-08-09 (×5): 1 mg via ORAL
  Filled 2018-08-05 (×5): qty 1

## 2018-08-05 MED ORDER — PANTOPRAZOLE SODIUM 40 MG PO TBEC
40.0000 mg | DELAYED_RELEASE_TABLET | Freq: Two times a day (BID) | ORAL | Status: DC
  Administered 2018-08-05 – 2018-08-09 (×9): 40 mg via ORAL
  Filled 2018-08-05 (×12): qty 1

## 2018-08-05 MED ORDER — VITAMIN B-1 100 MG PO TABS
100.0000 mg | ORAL_TABLET | Freq: Every day | ORAL | Status: DC
  Administered 2018-08-05 – 2018-08-09 (×5): 100 mg via ORAL
  Filled 2018-08-05 (×5): qty 1

## 2018-08-05 MED ORDER — SODIUM CHLORIDE 0.9 % IV SOLN: INTRAVENOUS | Status: DC

## 2018-08-05 MED ORDER — LACTATED RINGERS IV SOLN
INTRAVENOUS | Status: DC
  Administered 2018-08-05: 12:00:00 via INTRAVENOUS

## 2018-08-05 MED ORDER — ONDANSETRON HCL 4 MG/2ML IJ SOLN: 4.0000 mg | Freq: Four times a day (QID) | INTRAMUSCULAR | Status: DC | PRN

## 2018-08-05 SURGICAL SUPPLY — 15 items

## 2018-08-05 NOTE — Consult Note (Signed)
Referring Provider: Dr. Cristal Ford Primary Care Physician:  Patient, No Pcp Per Primary Gastroenterologist:  Althia Forts   Reason for Consultation:  Epigastric pain, GI bleed/melena  HPI: Barry Moore is a 53 y.o. male with a past medical history of alcoholic hepatitis, pancreatitis, seizures, hypertension depression and substance abuse.  He is homeless. He vomited dark red blood x 2  And passed loose dark brown stool on Tues. 5/12. Wed he no vomiting but continued to pass dark brown almost black loose stools 3 to 4 episodes. Thurs he developed central abdominal pain around the umbilicus which radiated up to his epigastric area. Intermittent chest pain. He continued to pass dark brown loose stools. No bright red blood per the rectum. Fri 5/15 his central abdominal pain was severe, passed 5 to 6 very dark loose stools. He drinks 6 to 7 (42oz) beers daily for 20+ years. Last alcohol intake was 5/15. He smoke cocaine once monthly, last smoked cocaine on 5/14.    ED course: Sodium 137.  Potassium 3.8.  Glucose 98.  Creatinine 0.66.  BUN less than 5.  Alk phos 137.  Lipase 48.  Albumin 3.3.  AST 139.  ALT 32.  Total bili 1.1.  Troponin less than 0.03.  Iron 14.  TIBC 451.  Iron saturation 3.  Ferritin 13.  WBC 3.4.  Hemoglobin 9.7.  Hematocrit 30.4.  MCV 77.2.  Platelets 80.  PT 14.7.  INR 1.2. SARS negative. Ethyl Alcohol 196.   GI History: EGD by Dr. Benson Norway 11/02/2016:    A 3 cm hiatal hernia was present. A non-obstructing and mild Schatzki ring (acquired) was found in the lower third of the esophagus. Mild portal hypertensive gastropathy was found in the entire examined stomach. One non-bleeding cratered duodenal ulcer with no stigmata of bleeding was found in the duodenal bulb.The lesion was 5 mm in largest dimension.Segmental moderate inflammation characterized by erythema and granularity was found in the duodenal Bulb  Abdominal pelvic CT with IV contrast 05/24/2018:  No focal lesion  identified. Diffuse increased echogenicity of the liver parenchyma consistent with hepatic steatosis. Portal vein is patent on color Doppler imaging with normal direction of blood flow towards the liver. Edema of the gallbladder wall. This may be due to the patient's hypoalbuminemia. Appendix was not visualized. (Pt denies having past appendectomy).   Past Medical History:  Diagnosis Date  . Alcoholic hepatitis   . Depression   . ETOH abuse   . Hypertension   . Pancreatitis   . Seizures ([place])   . Suicidal behavior     Past Surgical History:  Procedure Laterality Date  . ESOPHAGOGASTRODUODENOSCOPY (EGD) WITH PROPOFOL N/A 11/02/2016   Procedure: ESOPHAGOGASTRODUODENOSCOPY (EGD) WITH PROPOFOL;  Surgeon: Carol Ada, MD;  Location: [facility];  Service: Endoscopy;  Laterality: N/A;  . HERNIA REPAIR    . LEG SURGERY      Prior to Admission medications   Medication Sig Start Date End Date Taking? Authorizing Provider  omeprazole (PRILOSEC) 20 MG capsule Take 1 capsule (20 mg total) by mouth daily. Patient not taking: Reported on 08/04/2018 05/24/18   Eustaquio Maize, PA-C  pantoprazole (PROTONIX) 20 MG tablet Take 1 tablet (20 mg total) by mouth daily. Patient not taking: Reported on 08/04/2018 03/26/18   Fatima Blank, MD  sucralfate (CARAFATE) 1 GM/10ML suspension Take 10 mLs (1 g total) by mouth 4 (four) times daily -  with meals and at bedtime. Patient not taking: Reported on 08/04/2018 03/26/18   Cardama, Grayce Sessions,  MD    Current Facility-Administered Medications  Medication Dose Route Frequency Provider Last Rate Last Dose  . folic acid (FOLVITE) tablet 1 mg  1 mg Oral Daily Adefeso, Oladapo, DO      . LORazepam (ATIVAN) injection 2-3 mg  2-3 mg Intravenous Q1H PRN Adefeso, Oladapo, DO      . multivitamin with minerals tablet 1 tablet  1 tablet Oral Daily Adefeso, Oladapo, DO      . octreotide (SANDOSTATIN) 2 mcg/mL load via infusion 50 mcg  50 mcg Intravenous Once  Ford, Kelsey N, PA-C       And  . octreotide (SANDOSTATIN) 500 mcg in sodium chloride 0.9 % 250 mL (2 mcg/mL) infusion  50 mcg/hr Intravenous Continuous Jacqlyn Larsen, PA-C 25 mL/hr at 08/05/18 0322 50 mcg/hr at 08/05/18 0322  . ondansetron (ZOFRAN) injection 4 mg  4 mg Intravenous Q6H PRN Adefeso, Oladapo, DO      . pantoprazole (PROTONIX) 80 mg in sodium chloride 0.9 % 250 mL (0.32 mg/mL) infusion  8 mg/hr Intravenous Continuous Jacqlyn Larsen, PA-C   Stopped at 08/04/18 2311  . thiamine (VITAMIN B-1) tablet 100 mg  100 mg Oral Daily Adefeso, Oladapo, DO        Allergies as of 08/04/2018 - Review Complete 08/04/2018  Allergen Reaction Noted  . Tomato Shortness Of Breath and Nausea And Vomiting 02/28/2017  . Aspirin Nausea And Vomiting 11/21/2015    Family History  Problem Relation Age of Onset  . Diabetes Mother   . Alcoholism Mother   . Emphysema Father   . Lung cancer Father   . Alcoholism Father     Social History   Socioeconomic History  . Marital status: Divorced    Spouse name: Not on file  . Number of children: Not on file  . Years of education: Not on file  . Highest education level: Not on file  Occupational History  . Not on file  Social Needs  . Financial resource strain: Not on file  . Food insecurity:    Worry: Not on file    Inability: Not on file  . Transportation needs:    Medical: Not on file    Non-medical: Not on file  Tobacco Use  . Smoking status: Current Every Day Smoker    Packs/day: 1.00    Types: Cigarettes  . Smokeless tobacco: Never Used  Substance and Sexual Activity  . Alcohol use: Yes    Comment: 4-5 40oz beers daily  . Drug use: Not Currently    Frequency: 3.0 times per week    Types: Cocaine  . Sexual activity: Not Currently  Lifestyle  . Physical activity:    Days per week: Not on file    Minutes per session: Not on file  . Stress: Not on file  Relationships  . Social connections:    Talks on phone: Not on file    Gets  together: Not on file    Attends religious service: Not on file    Active member of club or organization: Not on file    Attends meetings of clubs or organizations: Not on file    Relationship status: Not on file  . Intimate partner violence:    Fear of current or ex partner: Not on file    Emotionally abused: Not on file    Physically abused: Not on file    Forced sexual activity: Not on file  Other Topics Concern  . Not on file  Social  History Narrative  . Not on file    Review of Systems: Gen: + fatigue, no weight loss CV: see  HPI, no current chest pain, no palpitations Resp: No SOB or cough GI: See HPI, no heartburn or dysphagia GU : No dysuria or change in urine color MS: Denies joint pain or muscle aches Derm: Denies rash, itching, Psych: + depression and anxiety  Heme: Denies bruising Neuro:  Denies any headaches, dizziness, paresthesias. Endo:  Denies any problems with DM, thyroid, adrenal function.  Physical Exam: Vital signs in last 24 hours: Temp:  [97.8 F (36.6 C)-99.5 F (37.5 C)] 98.6 F (37 C) (05/16 0800) Pulse Rate:  [73-103] 73 (05/16 0800) Resp:  [14-20] 14 (05/16 0800) BP: (110-141)/(68-91) 141/91 (05/16 0800) SpO2:  [92 %-100 %] 97 % (05/16 0800) Weight:  [70.8 kg-79.4 kg] 70.8 kg (05/16 0058) Last BM Date: 08/04/18 General:   Alert, fatigued appearing in NAD. Head:  Normocephalic and atraumatic. Eyes:  Sclera with erythematous injections. Conjunctiva pink. Ears:  Normal auditory acuity. Nose:  No deformity, discharge,  or lesions. Mouth:  Poor dentition.  Neck:  Supple; no masses or thyromegaly. Lungs: Clear throughout, diminished bases. Heart: RRR, S1,S2 no murmurs. Abdomen: Soft, nondistended, tenderness to the umbilical area and RLQ without rebound or guarding, + BS x 4 quads.   Rectal:  Deferred, FOBT positive. Msk:  Symmetrical without gross deformities. . Pulses:  Normal pulses noted. Extremities:  Without clubbing or edema.  Neurologic:  Alert and  oriented x4;  grossly normal neurologically. UEs mildly tremulous, no asterixis. Skin:  Intact without significant lesions or rashes.. Psych:  Alert and cooperative. Normal mood and affect.  Intake/Output from previous day: 05/15 0701 - 05/16 0700 In: 236.5 [I.V.:236.5] Out: 600 [Urine:600] Intake/Output this shift: No intake/output data recorded.  Lab Results: Recent Labs    08/04/18 2042  WBC 3.4*  HGB 9.7*  HCT 30.4*  PLT 80*   BMET Recent Labs    08/04/18 2042  NA 137  K 3.8  CL 105  CO2 17*  GLUCOSE 98  BUN <5*  CREATININE 0.66  CALCIUM 8.3*   LFT Recent Labs    08/04/18 2042  PROT 7.9  ALBUMIN 3.3*  AST 139*  ALT 32  ALKPHOS 137*  BILITOT 1.1   PT/INR Recent Labs    08/04/18 2343  LABPROT 14.7  INR 1.2    Studies/Results: Dg Chest Port 1 View  Result Date: 08/04/2018 CLINICAL DATA:  Chest pain EXAM: PORTABLE CHEST 1 VIEW COMPARISON:  May 24, 2018. FINDINGS: No edema or consolidation. Heart size and pulmonary vascularity are normal. No adenopathy. There is evidence of old trauma involving the medial left clavicle with remodeling. No pneumothorax. IMPRESSION: No edema or consolidation.  Stable cardiac silhouette. Electronically Signed   By: Lowella Grip III M.D.   On: 08/04/2018 20:27    IMPRESSION/PLAN:   1. 53 y.o. male with a hx of Etoh hepatitis, pancreatitis, cocaine use, nonbleeding duodenal ulcer per EGD presents with central/epigastsric pain, GI bleed, melena and IDA. Hg 9.7. Iron 14. BUN <5. -NPO -EGD today -continue Protonix 90m/hr infusion -continue Octreotide 50cmg/min infusion for now -repeat H/H 10 am -transfuse for Hg < 7 -monitor for GI bleeding, melena/hematemesis -abdominal/pelvic CT with IV contrast (periumbilical and RLQ tenderness on exam without rebound)  2. Pancytopenia: WBC 3.4. Hg 9.7. PlT 80. Rule out alcoholic cirrhosis. Rule out esophageal/gastric varices   3. Alcohol abuse, active  drug use at risk for Etoh withdrawl -CIWA  protocol -monitor for withdrawal    Noralyn Pick  08/05/2018, 8:24 AM

## 2018-08-05 NOTE — Op Note (Signed)
[name] Patient Name: Barry Moore Procedure Date : 08/05/2018 MRN: [id number] Attending MD: Justice Britain , MD Date of Birth: 03-10-1966 CSN: [id number] Age: 53 Admit Type: Inpatient Procedure:                Upper GI endoscopy Indications:              Coffee-ground emesis, Hematemesis, Melena Providers:                Justice Britain, MD, Burtis Junes, RN, William Dalton, Technician Referring MD:              Medicines:                Monitored Anesthesia Care Complications:            No immediate complications. Estimated Blood Loss:     Estimated blood loss was minimal. Procedure:                Pre-Anesthesia Assessment:                           - Prior to the procedure, a History and Physical                            was performed, and patient medications and                            allergies were reviewed. The patient's tolerance of                            previous anesthesia was also reviewed. The risks                            and benefits of the procedure and the sedation                            options and risks were discussed with the patient.                            All questions were answered, and informed consent                            was obtained. Prior Anticoagulants: The patient has                            taken no previous anticoagulant or antiplatelet                            agents. ASA Grade Assessment: III - A patient with                            severe systemic disease. After reviewing the risks  and benefits, the patient was deemed in                            satisfactory condition to undergo the procedure.                           After obtaining informed consent, the endoscope was                            passed under direct vision. Throughout the                            procedure, the patient's blood pressure, pulse, and         oxygen saturations were monitored continuously. The                            GIF-H190 ([id number] Olympus gastroscope was                            introduced through the mouth, and advanced to the                            second part of duodenum. The upper GI endoscopy was                            accomplished without difficulty. The patient                            tolerated the procedure. Scope In: Scope Out: Findings:      White nummular lesions were noted in the entire esophagus. Biopsies were       taken with a cold forceps for histology.      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      LA Grade B (one or more mucosal breaks greater than 5 mm, not extending       between the tops of two mucosal folds) esophagitis with no bleeding was       found in the distal esophagus.      One tongue of salmon-colored mucosa was present from 37 to 40 cm and       scattered islands of salmon-colored mucosa were present at 38 cm.       Erosion was present at 40 cm.      Mild portal hypertensive gastropathy was found in the gastric body.      Multiple dispersed small erosions were found in the gastric body and in       the gastric antrum. Biopsies were taken with a cold forceps for       histology and Helicobacter pylori testing.      One superficial duodenal ulcer with a clean ulcer base (Forrest Class       III) was found in the duodenal bulb. The lesion was 6 mm in largest       dimension.      Localized nodular mucosa was found in the duodenal bulb. Biopsies were       taken with a cold forceps for histology.  No gross lesions were noted in the second portion of the duodenum. Impression:               - White nummular lesions in esophageal mucosa.                            Biopsied.                           - Grade I and small (< 5 mm) esophageal varices.                           - LA Grade B esophagitis.                           -  Salmon-colored mucosa suspicious for                            short-segment Barrett's esophagus.                           - Portal hypertensive gastropathy.                           - Erosive gastropathy. Biopsied.                           - Duodenal ulcer with a clean ulcer base (Forrest                            Class III).                           - Nodular mucosa in the duodenal bulb. Biopsied.                           - No gross lesions in the second portion of the                            duodenum. Recommendation:           - The patient will be observed post-procedure,                            until all discharge criteria are met.                           - Return patient to hospital ward for ongoing care.                           - Patient has a contact number available for                            emergencies. The signs and symptoms of potential                            delayed complications were discussed with the  patient. Return to normal activities tomorrow.                            Written discharge instructions were provided to the                            patient.                           - Resume previous diet.                           - Transition to PO PPI BID. Stop Octreotide.                           - Patient will need a colonoscopy for workup of                            Iron deficiency. Will discuss due to his                            homelessness whether he would want to do here or do                            in the future. Current presentation of anemia and                            hematemesis is most likely associated with his                            findings on EGD today. Needs repeat EGD In 74-month                            to re-evaluate the regions and esophagitis and                            consider biopsies to rule out Barrett's (though                            would have to be mindful  since the changes are on                            varices).                           - The findings and recommendations were discussed                            with the patient.                           - The findings and recommendations were discussed  with the Hospital Team. Procedure Code(s):        --- Professional ---                           [id number], Esophagogastroduodenoscopy, flexible,                            transoral; with biopsy, single or multiple Diagnosis Code(s):        --- Professional ---                           K22.8, Other specified diseases of esophagus                           I85.00, Esophageal varices without bleeding                           K20.9, Esophagitis, unspecified                           K76.6, Portal hypertension                           K31.89, Other diseases of stomach and duodenum                           K26.9, Duodenal ulcer, unspecified as acute or                            chronic, without hemorrhage or perforation                           K92.0, Hematemesis                           K92.1, Melena (includes Hematochezia) CPT copyright 2019 [employer] Association. All rights reserved. The codes documented in this report are preliminary and upon coder review may  be revised to meet current compliance requirements. Justice Britain, MD 08/05/2018 1:42:40 PM Number of Addenda: 0

## 2018-08-05 NOTE — Progress Notes (Signed)
Pt went to EGD via wheelchair with Maurine Minister accompany, consent form was signed. Vital signs remained stable.Pt was alert and oriented, no epigastric pain. Ambulated in the room with one standby assist to bathroom. CORONA virus screening was negative , the result from 08/04/2018.   Kennyth Lose, BSN,RN,PICC-CSC,CMC

## 2018-08-05 NOTE — H&P (Signed)
GASTROENTEROLOGY PROCEDURE H&P NOTE   Primary Care Physician: Patient, No Pcp Per  HPI: Barry Moore is a 52 y.o. male who presents for EGD.  Past Medical History:  Diagnosis Date  . Alcoholic hepatitis   . Depression   . ETOH abuse   . Hypertension   . Pancreatitis   . Seizures ([place])   . Suicidal behavior    Past Surgical History:  Procedure Laterality Date  . ESOPHAGOGASTRODUODENOSCOPY (EGD) WITH PROPOFOL N/A 11/02/2016   Procedure: ESOPHAGOGASTRODUODENOSCOPY (EGD) WITH PROPOFOL;  Surgeon: Carol Ada, MD;  Location: [facility] ENDOSCOPY;  Service: Endoscopy;  Laterality: N/A;  . HERNIA REPAIR    . LEG SURGERY     Current Facility-Administered Medications  Medication Dose Route Frequency Provider Last Rate Last Dose  . [MAR Hold] folic acid (FOLVITE) tablet 1 mg  1 mg Oral Daily Adefeso, Oladapo, DO   1 mg at 08/05/18 0834  . lactated ringers infusion   Intravenous Continuous Mansouraty, Telford Nab., MD 50 mL/hr at 08/05/18 1145    . [MAR Hold] LORazepam (ATIVAN) injection 2-3 mg  2-3 mg Intravenous Q1H PRN Adefeso, Oladapo, DO      . [MAR Hold] multivitamin with minerals tablet 1 tablet  1 tablet Oral Daily Adefeso, Oladapo, DO   1 tablet at 08/05/18 0833  . [MAR Hold] octreotide (SANDOSTATIN) 2 mcg/mL load via infusion 50 mcg  50 mcg Intravenous Once Ford, Kelsey N, PA-C       And  . [MAR Hold] octreotide (SANDOSTATIN) 500 mcg in sodium chloride 0.9 % 250 mL (2 mcg/mL) infusion  50 mcg/hr Intravenous Continuous Jacqlyn Larsen, PA-C 25 mL/hr at 08/05/18 1012 50 mcg/hr at 08/05/18 1012  . [MAR Hold] ondansetron (ZOFRAN) injection 4 mg  4 mg Intravenous Q6H PRN Adefeso, Oladapo, DO      . pantoprazole (PROTONIX) 80 mg in sodium chloride 0.9 % 250 mL (0.32 mg/mL) infusion  8 mg/hr Intravenous Continuous Jacqlyn Larsen, PA-C 25 mL/hr at 08/05/18 1010 8 mg/hr at 08/05/18 1010  . [MAR Hold] thiamine (VITAMIN B-1) tablet 100 mg  100 mg Oral Daily Adefeso, Oladapo, DO   100 mg at  08/05/18 0834   Allergies  Allergen Reactions  . Tomato Shortness Of Breath and Nausea And Vomiting  . Aspirin Nausea And Vomiting   Family History  Problem Relation Age of Onset  . Diabetes Mother   . Alcoholism Mother   . Emphysema Father   . Lung cancer Father   . Alcoholism Father    Social History   Socioeconomic History  . Marital status: Divorced    Spouse name: Not on file  . Number of children: Not on file  . Years of education: Not on file  . Highest education level: Not on file  Occupational History  . Not on file  Social Needs  . Financial resource strain: Not on file  . Food insecurity:    Worry: Not on file    Inability: Not on file  . Transportation needs:    Medical: Not on file    Non-medical: Not on file  Tobacco Use  . Smoking status: Current Every Day Smoker    Packs/day: 1.00    Types: Cigarettes  . Smokeless tobacco: Never Used  Substance and Sexual Activity  . Alcohol use: Yes    Comment: 4-5 40oz beers daily  . Drug use: Not Currently    Frequency: 3.0 times per week    Types: Cocaine  . Sexual activity: Not  Currently  Lifestyle  . Physical activity:    Days per week: Not on file    Minutes per session: Not on file  . Stress: Not on file  Relationships  . Social connections:    Talks on phone: Not on file    Gets together: Not on file    Attends religious service: Not on file    Active member of club or organization: Not on file    Attends meetings of clubs or organizations: Not on file    Relationship status: Not on file  . Intimate partner violence:    Fear of current or ex partner: Not on file    Emotionally abused: Not on file    Physically abused: Not on file    Forced sexual activity: Not on file  Other Topics Concern  . Not on file  Social History Narrative  . Not on file    Physical Exam: Vital signs in last 24 hours: Temp:  [97.8 F (36.6 C)-99.5 F (37.5 C)] 99.3 F (37.4 C) (05/16 1137) Pulse Rate:  [73-103]  77 (05/16 1137) Resp:  [14-20] 16 (05/16 1137) BP: (110-160)/(68-95) 160/95 (05/16 1137) SpO2:  [92 %-100 %] 98 % (05/16 1137) Weight:  [70.8 kg-79.4 kg] 70.8 kg (05/16 1137) Last BM Date: 08/04/18 GEN: NAD EYE: Sclerae anicteric ENT: MMM CV: RR without R/Gs  RESP: CTAB posteriorly GI: Soft, NT/ND NEURO:  Alert & Oriented x 3  Lab Results: Recent Labs    08/04/18 2042 08/05/18 0944  WBC 3.4* 2.8*  HGB 9.7* 8.1*  HCT 30.4* 25.7*  PLT 80* 83*   BMET Recent Labs    08/04/18 2042 08/05/18 0944  NA 137 134*  K 3.8 3.7  CL 105 100  CO2 17* 20*  GLUCOSE 98 107*  BUN <5* <5*  CREATININE 0.66 0.71  CALCIUM 8.3* 7.7*   LFT Recent Labs    08/05/18 0944  PROT 6.9  ALBUMIN 3.0*  AST 139*  ALT 31  ALKPHOS 120  BILITOT 1.3*   PT/INR Recent Labs    08/04/18 2343  LABPROT 14.7  INR 1.2     Impression / Plan: This is a 53 y.o.male who presents for EGD.  The risks and benefits of endoscopic evaluation were discussed with the patient; these include but are not limited to the risk of perforation, infection, bleeding, missed lesions, lack of diagnosis, severe illness requiring hospitalization, as well as anesthesia and sedation related illnesses.  The patient is agreeable to proceed.    Justice Britain, MD [facility] Office # [phone]

## 2018-08-05 NOTE — Anesthesia Preprocedure Evaluation (Addendum)
Anesthesia Evaluation  Patient identified by MRN, date of birth, ID band Patient awake    Reviewed: Allergy & Precautions, NPO status , Patient's Chart, lab work & pertinent test results  Airway Mallampati: II  TM Distance: >3 FB Neck ROM: Full    Dental  (+) Edentulous Upper, Edentulous Lower, Dental Advisory Given   Pulmonary Current Smoker,    breath sounds clear to auscultation       Cardiovascular hypertension,  Rhythm:Regular Rate:Normal     Neuro/Psych    GI/Hepatic   Endo/Other    Renal/GU      Musculoskeletal   Abdominal   Peds  Hematology   Anesthesia Other Findings   Reproductive/Obstetrics                            Anesthesia Physical Anesthesia Plan  ASA: III  Anesthesia Plan: MAC   Post-op Pain Management:    Induction:   PONV Risk Score and Plan: 1 and Propofol infusion  Airway Management Planned: Nasal Cannula  Additional Equipment: None  Intra-op Plan:   Post-operative Plan: Extubation in OR  Informed Consent: I have reviewed the patients History and Physical, chart, labs and discussed the procedure including the risks, benefits and alternatives for the proposed anesthesia with the patient or authorized representative who has indicated his/her understanding and acceptance.     Dental advisory given  Plan Discussed with: CRNA, Anesthesiologist and Surgeon  Anesthesia Plan Comments:         Anesthesia Quick Evaluation

## 2018-08-05 NOTE — Progress Notes (Signed)
Pt came back from EGD, alert and oriented x 4, able to get up and walk to his bed, vital signs remained stable, no distress. We ordered cardiac diet for him and he was able to eat without any nausea and vomiting. No abdominal pain.Will continue to monitor.  Rodolph Hagemann,BSN,RN,PCCN-CMC,CSC

## 2018-08-05 NOTE — Transfer of Care (Signed)
Immediate Anesthesia Transfer of Care Note  Patient: Barry Moore  Procedure(s) Performed: ESOPHAGOGASTRODUODENOSCOPY (EGD) WITH PROPOFOL (N/A ) BIOPSY  Patient Location: Endoscopy Unit  Anesthesia Type:MAC  Level of Consciousness: drowsy and patient cooperative  Airway & Oxygen Therapy: Patient Spontanous Breathing and Patient connected to nasal cannula oxygen  Post-op Assessment: Report given to RN, Post -op Vital signs reviewed and stable and Patient moving all extremities  Post vital signs: Reviewed and stable  Last Vitals:  Vitals Value Taken Time  BP    Temp    Pulse    Resp    SpO2      Last Pain:  Vitals:   08/05/18 1137  TempSrc: Oral  PainSc: 7          Complications: No apparent anesthesia complications

## 2018-08-05 NOTE — ED Notes (Signed)
ED TO INPATIENT HANDOFF REPORT  ED Nurse Name and Phone #: Denyse Dago, RN [phone]  S Name/Age/Gender Barry Moore 53 y.o. male Room/Bed: 033C/033C  Code Status   Code Status: Prior  Home/SNF/Other Home Patient oriented to: self, place, time and situation Is this baseline? Yes   Triage Complete: Triage complete  Chief Complaint CP  Triage Note Patient in via [facility] from street (homeless) c/o chest pain and lower back x 3 days. Denies shortness of breath or fevers/chills. EMS VS: HR 82 NSR, RR 18, 120/88, 100% RA, temp 97.80F temporal. Resp e/u, skin w/d.    Allergies Allergies  Allergen Reactions  . Tomato Shortness Of Breath and Nausea And Vomiting  . Aspirin Nausea And Vomiting    Level of Care/Admitting Diagnosis ED Disposition    ED Disposition Condition [facility] Area: [place] [[id number]]  Level of Care: Progressive [102]  Covid Evaluation: N/A  Diagnosis: Acute GI bleeding [381017]  Admitting Physician: Bernadette Hoit [[id number]]  Attending Physician: Bernadette Hoit [[id number]]  Estimated length of stay: past midnight tomorrow  Certification:: I certify this patient will need inpatient services for at least 2 midnights  PT Class (Do Not Modify): Inpatient [101]  PT Acc Code (Do Not Modify): Private [1]       B Medical/Surgery History Past Medical History:  Diagnosis Date  . Alcoholic hepatitis   . Depression   . ETOH abuse   . Hypertension   . Pancreatitis   . Seizures ([place])   . Suicidal behavior    Past Surgical History:  Procedure Laterality Date  . ESOPHAGOGASTRODUODENOSCOPY (EGD) WITH PROPOFOL N/A 11/02/2016   Procedure: ESOPHAGOGASTRODUODENOSCOPY (EGD) WITH PROPOFOL;  Surgeon: Carol Ada, MD;  Location: [facility];  Service: Endoscopy;  Laterality: N/A;  . HERNIA REPAIR    . LEG SURGERY       A IV Location/Drains/Wounds Patient Lines/Drains/Airways Status   Active Line/Drains/Airways    Name:    Placement date:   Placement time:   Site:   Days:   Peripheral IV 08/04/18 Left Forearm   08/04/18    1700    Forearm   1   Peripheral IV 08/04/18 Right Forearm   08/04/18    2319    Forearm   1          Intake/Output Last 24 hours No intake or output data in the 24 hours ending 08/05/18 0010  Labs/Imaging Results for orders placed or performed during the hospital encounter of 08/04/18 (from the past 48 hour(s))  CBC with Differential     Status: Abnormal   Collection Time: 08/04/18  8:42 PM  Result Value Ref Range   WBC 3.4 (L) 4.0 - 10.5 K/uL   RBC 3.94 (L) 4.22 - 5.81 MIL/uL   Hemoglobin 9.7 (L) 13.0 - 17.0 g/dL   HCT 30.4 (L) 39.0 - 52.0 %   MCV 77.2 (L) 80.0 - 100.0 fL   MCH 24.6 (L) 26.0 - 34.0 pg   MCHC 31.9 30.0 - 36.0 g/dL   RDW 17.2 (H) 11.5 - 15.5 %   Platelets 80 (L) 150 - 400 K/uL    Comment: REPEATED TO VERIFY PLATELET COUNT CONFIRMED BY SMEAR Immature Platelet Fraction may be clinically indicated, consider ordering this additional test [id number]    nRBC 0.0 0.0 - 0.2 %   Neutrophils Relative % 41 %   Neutro Abs 1.4 (L) 1.7 - 7.7 K/uL   Lymphocytes Relative 40 %   Lymphs  Abs 1.4 0.7 - 4.0 K/uL   Monocytes Relative 14 %   Monocytes Absolute 0.5 0.1 - 1.0 K/uL   Eosinophils Relative 2 %   Eosinophils Absolute 0.1 0.0 - 0.5 K/uL   Basophils Relative 2 %   Basophils Absolute 0.1 0.0 - 0.1 K/uL   Immature Granulocytes 1 %   Abs Immature Granulocytes 0.02 0.00 - 0.07 K/uL    Comment: Performed at [facility], [place] [address], [place], [place] [address]  Lipase, blood     Status: None   Collection Time: 08/04/18  8:42 PM  Result Value Ref Range   Lipase 48 11 - 51 U/L    Comment: Performed at [facility] [address], [place], [place] [address]  Troponin I - Once     Status: None   Collection Time: 08/04/18  8:42 PM  Result Value Ref Range   Troponin I <0.03 <0.03 ng/mL    Comment: Performed at [facility] [address],  [place], [place] [address]  Comprehensive metabolic panel     Status: Abnormal   Collection Time: 08/04/18  8:42 PM  Result Value Ref Range   Sodium 137 135 - 145 mmol/L   Potassium 3.8 3.5 - 5.1 mmol/L   Chloride 105 98 - 111 mmol/L   CO2 17 (L) 22 - 32 mmol/L   Glucose, Bld 98 70 - 99 mg/dL   BUN <5 (L) 6 - 20 mg/dL   Creatinine, Ser 0.66 0.61 - 1.24 mg/dL   Calcium 8.3 (L) 8.9 - 10.3 mg/dL   Total Protein 7.9 6.5 - 8.1 g/dL   Albumin 3.3 (L) 3.5 - 5.0 g/dL   AST 139 (H) 15 - 41 U/L   ALT 32 0 - 44 U/L   Alkaline Phosphatase 137 (H) 38 - 126 U/L   Total Bilirubin 1.1 0.3 - 1.2 mg/dL   GFR calc non Af Amer >60 >60 mL/min   GFR calc Af Amer >60 >60 mL/min   Anion gap 15 5 - 15    Comment: Performed at [facility], [place] [address], [place], [place] [address]  POC occult blood, ED Provider will collect     Status: Abnormal   Collection Time: 08/04/18  9:45 PM  Result Value Ref Range   Fecal Occult Bld POSITIVE (A) NEGATIVE  SARS Coronavirus 2 (CEPHEID - Performed in [facility] [facility] hospital lab), Hosp Order     Status: None   Collection Time: 08/04/18 10:07 PM  Result Value Ref Range   SARS Coronavirus 2 NEGATIVE NEGATIVE    Comment: (NOTE) If result is NEGATIVE SARS-CoV-2 target nucleic acids are NOT DETECTED. The SARS-CoV-2 RNA is generally detectable in upper and lower  respiratory specimens during the acute phase of infection. The lowest  concentration of SARS-CoV-2 viral copies this assay can detect is 250  copies / mL. A negative result does not preclude SARS-CoV-2 infection  and should not be used as the sole basis for treatment or other  patient management decisions.  A negative result may occur with  improper specimen collection / handling, submission of specimen other  than nasopharyngeal swab, presence of viral mutation(s) within the  areas targeted by this assay, and inadequate number of viral copies  (<250 copies / mL). A negative result must be combined with clinical   observations, patient history, and epidemiological information. If result is POSITIVE SARS-CoV-2 target nucleic acids are DETECTED. The SARS-CoV-2 RNA is generally detectable in  upper and lower  respiratory specimens dur ing the acute phase of infection.  Positive  results are indicative of active infection with SARS-CoV-2.  Clinical  correlation with patient history and other diagnostic information is  necessary to determine patient infection status.  Positive results do  not rule out bacterial infection or co-infection with other viruses. If result is PRESUMPTIVE POSTIVE SARS-CoV-2 nucleic acids MAY BE PRESENT.   A presumptive positive result was obtained on the submitted specimen  and confirmed on repeat testing.  While 2019 novel coronavirus  (SARS-CoV-2) nucleic acids may be present in the submitted sample  additional confirmatory testing may be necessary for epidemiological  and / or clinical management purposes  to differentiate between  SARS-CoV-2 and other Sarbecovirus currently known to infect humans.  If clinically indicated additional testing with an alternate test  methodology [phone]) is advised. The SARS-CoV-2 RNA is generally  detectable in upper and lower respiratory sp ecimens during the acute  phase of infection. The expected result is Negative. Fact Sheet for Patients:  StrictlyIdeas.no Fact Sheet for Healthcare Providers: BankingDealers.co.za This test is not yet approved or cleared by the [place] FDA and has been authorized for detection and/or diagnosis of SARS-CoV-2 by FDA under an Emergency Use Authorization (EUA).  This EUA will remain in effect (meaning this test can be used) for the duration of the COVID-19 declaration under Section 564(b)(1) of the Act, 21 U.S.C. section 360bbb-3(b)(1), unless the authorization is terminated or revoked sooner. Performed at [facility], [place] [address],  [place], [place] [address]   Type and screen     Status: None (Preliminary result)   Collection Time: 08/04/18 11:45 PM  Result Value Ref Range   ABO/RH(D) PENDING    Antibody Screen PENDING    Sample Expiration      08/07/2018,2359 Performed at [facility], [place] [address], [place], [place] [address]    Dg Chest Port 1 View  Result Date: 08/04/2018 CLINICAL DATA:  Chest pain EXAM: PORTABLE CHEST 1 VIEW COMPARISON:  May 24, 2018. FINDINGS: No edema or consolidation. Heart size and pulmonary vascularity are normal. No adenopathy. There is evidence of old trauma involving the medial left clavicle with remodeling. No pneumothorax. IMPRESSION: No edema or consolidation.  Stable cardiac silhouette. Electronically Signed   By: Lowella Grip III M.D.   On: 08/04/2018 20:27    Pending Labs Unresulted Labs (From admission, onward)    Start     Ordered   08/04/18 2303  Protime-INR  ONCE - STAT,   STAT     08/04/18 2302   08/04/18 1925  Urinalysis, Routine w reflex microscopic  ONCE - STAT,   STAT     08/04/18 1933          Vitals/Pain Today's Vitals   08/04/18 2000 08/04/18 2030 08/04/18 2100 08/04/18 2130  BP: 115/81 123/68 120/82 113/75  Pulse: 84 83 88 (!) 103  Resp:      Temp:      TempSrc:      SpO2: 100% 98% 99% 100%  Weight:      Height:      PainSc:        Isolation Precautions No active isolations  Medications Medications  pantoprazole (PROTONIX) 80 mg in sodium chloride 0.9 % 250 mL (0.32 mg/mL) infusion (8 mg/hr Intravenous New Bag/Given 08/04/18 2308)  octreotide (SANDOSTATIN) 2 mcg/mL load via infusion 50 mcg (has no administration in time range)    And  octreotide (SANDOSTATIN) 500 mcg in sodium chloride 0.9 % 250 mL (2 mcg/mL) infusion (50 mcg/hr Intravenous New Bag/Given 08/04/18 2321)  sodium chloride 0.9 % bolus 1,000 mL (0 mLs Intravenous Stopped 08/04/18 2048)  pantoprazole (PROTONIX) injection 40 mg (40 mg Intravenous Given 08/04/18 2003)   pantoprazole (PROTONIX) injection 40 mg (40 mg Intravenous Given 08/04/18 2224)  cefTRIAXone (ROCEPHIN) 1 g in sodium chloride 0.9 % 100 mL IVPB (0 g Intravenous Stopped 08/04/18 2317)    Mobility walks Low fall risk   Focused Assessments GI   R Recommendations: See Admitting Provider Note  Report given to:   Additional Notes:  Pt having dark watery stool. Other than that GI WDL.

## 2018-08-05 NOTE — Progress Notes (Signed)
Received report from [place], RN in ED about pt coming to [id number]. Lajoyce Corners, RN

## 2018-08-05 NOTE — Progress Notes (Addendum)
Pt arrived to [id number]. Assessments and CHG bath completed. Pt oriented to room and call bell. Pt placed on telemetry. CIWA scale perforned with result of 7. Pt says last drink of EtOH taken earlier last evening before coming to ED. Pt asked for ice chips. I will ask the MD. Pt resting in bed with fluids running. Will continue to monitor. Lajoyce Corners, RN

## 2018-08-05 NOTE — Anesthesia Postprocedure Evaluation (Signed)
Anesthesia Post Note  Patient: Barry Moore  Procedure(s) Performed: ESOPHAGOGASTRODUODENOSCOPY (EGD) WITH PROPOFOL (N/A ) BIOPSY     Patient location during evaluation: Endoscopy Anesthesia Type: MAC Level of consciousness: awake and alert Pain management: pain level controlled Vital Signs Assessment: post-procedure vital signs reviewed and stable Respiratory status: spontaneous breathing, nonlabored ventilation, respiratory function stable and patient connected to nasal cannula oxygen Cardiovascular status: stable and blood pressure returned to baseline Postop Assessment: no apparent nausea or vomiting Anesthetic complications: no    Last Vitals:  Vitals:   08/05/18 1406 08/05/18 1643  BP: (!) 161/83 (!) 147/82  Pulse: 79 76  Resp:  20  Temp: 36.8 C 37.2 C  SpO2: 99% 100%    Last Pain:  Vitals:   08/05/18 1643  TempSrc: Oral  PainSc:                  Hermione Havlicek COKER

## 2018-08-05 NOTE — H&P (Signed)
History and Physical    Barry Moore ZOX:[id number] DOB: 1965-05-17 DOA: 08/04/2018  Referring MD/NP/PA: Merleen Nicely PA PCP: Patient, No Pcp Per  Patient coming from:Homeless  Chief Complaint: Abdominal pain and low back pain  HPI: Barry Moore is a 53 y.o. homeless male with medical history significant of alcohol abuse, alcoholic hepatitis, hypertension, anemia and GI bleed who presents to the emergency department via EMS due to sharp abdominal pain in the epigastric area that has been ongoing for several days.  Apparently, it appears that patient complained of chest pain on arrival to the ED but he only complains of sharp abdominal pain in the epigastric area that has been ongoing for about 4 days, he states that he vomited blood about 4 days ago.  Patient admits to about 7- 8 40oz beer daily, with last consumption being shortly prior to arrival at the ED. Lying  still without movement appears to improve the pain, there was no aggravating factor.  He denies fever, chills, shortness of breath, diarrhea, numbness or tingling.  ED Course:  In the emergency department, vital signs are within normal range, work-up in the ED showed pancytopenia, increased transaminases, fecal occult blood was positive. SARS coronavirus 2 was negative.  Chest x-ray showed no edema or consolidation.  [place] gastro[facility]ologist (Dr. Rush Landmark) was consulted and recommended IV Protonix, IV octreotide with plan to see patient in the morning per ED physician.  Hospitalist was consulted.  For further evaluation and management  Review of Systems:  Constitutional: Negative for chills and fever.  HENT: Negative for ear pain and sore throat.   Eyes: Negative for pain and visual disturbance.  Respiratory: Negative for cough, chest tightness and shortness of breath.   Cardiovascular: Negative for chest pain and palpitations.  Gastrointestinal: Complaint of abdominal pain and vomiting of blood.  Endocrine: Negative for polyphagia  and polyuria.  Genitourinary: Negative for decreased urine volume, dysuria, enuresis. Musculoskeletal: Negative for arthralgias and back pain.  Skin: Negative for color change and rash.  Allergic/Immunologic: Negative for immunocompromised state.  Neurological: Negative for tremors, syncope, speech difficulty, weakness, light-headedness and headaches.  Hematological: Does not bruise/bleed easily.     Past Medical History:  Diagnosis Date  . Alcoholic hepatitis   . Depression   . ETOH abuse   . Hypertension   . Pancreatitis   . Seizures ([place])   . Suicidal behavior     Past Surgical History:  Procedure Laterality Date  . ESOPHAGOGASTRODUODENOSCOPY (EGD) WITH PROPOFOL N/A 11/02/2016   Procedure: ESOPHAGOGASTRODUODENOSCOPY (EGD) WITH PROPOFOL;  Surgeon: Carol Ada, MD;  Location: [facility];  Service: Endoscopy;  Laterality: N/A;  . HERNIA REPAIR    . LEG SURGERY       reports that he has been smoking cigarettes. He has been smoking about 1.00 pack per day. He has never used smokeless tobacco. He reports current alcohol use. He reports previous drug use. Frequency: 3.00 times per week. Drug: Cocaine.  Allergies  Allergen Reactions  . Tomato Shortness Of Breath and Nausea And Vomiting  . Aspirin Nausea And Vomiting    Family History  Problem Relation Age of Onset  . Diabetes Mother   . Alcoholism Mother   . Emphysema Father   . Lung cancer Father   . Alcoholism Father    Unacceptable: Noncontributory, unremarkable, or negative. Acceptable: Family history reviewed and not pertinent (If you reviewed it)  Prior to Admission medications   Medication Sig Start Date End Date Taking? Authorizing Provider  omeprazole (PRILOSEC) 20 MG  capsule Take 1 capsule (20 mg total) by mouth daily. Patient not taking: Reported on 08/04/2018 05/24/18   Eustaquio Maize, PA-C  pantoprazole (PROTONIX) 20 MG tablet Take 1 tablet (20 mg total) by mouth daily. Patient not taking: Reported on  08/04/2018 03/26/18   Fatima Blank, MD  sucralfate (CARAFATE) 1 GM/10ML suspension Take 10 mLs (1 g total) by mouth 4 (four) times daily -  with meals and at bedtime. Patient not taking: Reported on 08/04/2018 03/26/18   Fatima Blank, MD    Physical Exam: Vitals:   08/04/18 2330 08/05/18 0000 08/05/18 0058 08/05/18 0100  BP: 110/77 122/79 119/69 119/69  Pulse: 95 82 96 97  Resp:   15   Temp:   99.1 F (37.3 C)   TempSrc:   Oral   SpO2: 92% 97% 96%   Weight:   70.8 kg   Height:   5\' 7"  (1.702 m)       Constitutional: NAD, calm, comfortable Vitals:   08/04/18 2330 08/05/18 0000 08/05/18 0058 08/05/18 0100  BP: 110/77 122/79 119/69 119/69  Pulse: 95 82 96 97  Resp:   15   Temp:   99.1 F (37.3 C)   TempSrc:   Oral   SpO2: 92% 97% 96%   Weight:   70.8 kg   Height:   5\' 7"  (1.702 m)    Eyes: PERRL, lids and conjunctivae normal ENMT: Mucous membranes are moist. Posterior pharynx clear of any exudate or lesions.Normal dentition.  Neck: normal, supple, no masses, no thyromegaly Respiratory: clear to auscultation bilaterally, no wheezing, no crackles. Normal respiratory effort. No accessory muscle use.  Cardiovascular: Regular rate and rhythm, no murmurs / rubs / gallops. No extremity edema. 2+ pedal pulses. No carotid bruits.  Abdomen:Tender to palpation of epigastric area, No guarding or rebound tenderness. Bowel sounds positive.  Musculoskeletal: no clubbing / cyanosis. No joint deformity upper and lower extremities. Good ROM, no contractures. Normal muscle tone.  Skin: no rashes, lesions, ulcers. No induration Neurologic: CN 2-12 grossly intact. Sensation intact, DTR normal. Strength 5/5 in all 4.  Psychiatric: Normal judgment and insight. Alert and oriented x 3. Normal mood.   Labs on Admission: I have personally reviewed following labs and imaging studies  CBC: Recent Labs  Lab 08/04/18 2042  WBC 3.4*  NEUTROABS 1.4*  HGB 9.7*  HCT 30.4*  MCV 77.2*   PLT 80*   Basic Metabolic Panel: Recent Labs  Lab 08/04/18 2042  NA 137  K 3.8  CL 105  CO2 17*  GLUCOSE 98  BUN <5*  CREATININE 0.66  CALCIUM 8.3*   GFR: Estimated Creatinine Clearance: 99.8 mL/min (by C-G formula based on SCr of 0.66 mg/dL). Liver Function Tests: Recent Labs  Lab 08/04/18 2042  AST 139*  ALT 32  ALKPHOS 137*  BILITOT 1.1  PROT 7.9  ALBUMIN 3.3*   Recent Labs  Lab 08/04/18 2042  LIPASE 48   No results for input(s): AMMONIA in the last 168 hours. Coagulation Profile: Recent Labs  Lab 08/04/18 2343  INR 1.2   Cardiac Enzymes: Recent Labs  Lab 08/04/18 2042  TROPONINI <0.03   BNP (last 3 results) No results for input(s): PROBNP in the last 8760 hours. HbA1C: No results for input(s): HGBA1C in the last 72 hours. CBG: No results for input(s): GLUCAP in the last 168 hours. Lipid Profile: No results for input(s): CHOL, HDL, LDLCALC, TRIG, CHOLHDL, LDLDIRECT in the last 72 hours. Thyroid Function Tests: No results for  input(s): TSH, T4TOTAL, FREET4, T3FREE, THYROIDAB in the last 72 hours. Anemia Panel: No results for input(s): VITAMINB12, FOLATE, FERRITIN, TIBC, IRON, RETICCTPCT in the last 72 hours. Urine analysis:    Component Value Date/Time   COLORURINE COLORLESS (A) 12/11/2017 2200   APPEARANCEUR CLEAR 12/11/2017 2200   LABSPEC 1.001 (L) 12/11/2017 2200   PHURINE 6.0 12/11/2017 2200   GLUCOSEU NEGATIVE 12/11/2017 2200   HGBUR NEGATIVE 12/11/2017 2200   BILIRUBINUR NEGATIVE 12/11/2017 2200   KETONESUR NEGATIVE 12/11/2017 2200   PROTEINUR NEGATIVE 12/11/2017 2200   NITRITE NEGATIVE 12/11/2017 2200   LEUKOCYTESUR NEGATIVE 12/11/2017 2200   Sepsis Labs: @LABRCNTIP (procalcitonin:4,lacticidven:4) ) Recent Results (from the past 240 hour(s))  SARS Coronavirus 2 (CEPHEID - Performed in [place] hospital lab), Hosp Order     Status: None   Collection Time: 08/04/18 10:07 PM  Result Value Ref Range Status   SARS Coronavirus 2  NEGATIVE NEGATIVE Final    Comment: (NOTE) If result is NEGATIVE SARS-CoV-2 target nucleic acids are NOT DETECTED. The SARS-CoV-2 RNA is generally detectable in upper and lower  respiratory specimens during the acute phase of infection. The lowest  concentration of SARS-CoV-2 viral copies this assay can detect is 250  copies / mL. A negative result does not preclude SARS-CoV-2 infection  and should not be used as the sole basis for treatment or other  patient management decisions.  A negative result may occur with  improper specimen collection / handling, submission of specimen other  than nasopharyngeal swab, presence of viral mutation(s) within the  areas targeted by this assay, and inadequate number of viral copies  (<250 copies / mL). A negative result must be combined with clinical  observations, patient history, and epidemiological information. If result is POSITIVE SARS-CoV-2 target nucleic acids are DETECTED. The SARS-CoV-2 RNA is generally detectable in upper and lower  respiratory specimens dur ing the acute phase of infection.  Positive  results are indicative of active infection with SARS-CoV-2.  Clinical  correlation with patient history and other diagnostic information is  necessary to determine patient infection status.  Positive results do  not rule out bacterial infection or co-infection with other viruses. If result is PRESUMPTIVE POSTIVE SARS-CoV-2 nucleic acids MAY BE PRESENT.   A presumptive positive result was obtained on the submitted specimen  and confirmed on repeat testing.  While 2019 novel coronavirus  (SARS-CoV-2) nucleic acids may be present in the submitted sample  additional confirmatory testing may be necessary for epidemiological  and / or clinical management purposes  to differentiate between  SARS-CoV-2 and other Sarbecovirus currently known to infect humans.  If clinically indicated additional testing with an alternate test  methodology [phone])  is advised. The SARS-CoV-2 RNA is generally  detectable in upper and lower respiratory sp ecimens during the acute  phase of infection. The expected result is Negative. Fact Sheet for Patients:  StrictlyIdeas.no Fact Sheet for Healthcare Providers: BankingDealers.co.za This test is not yet approved or cleared by the [place] FDA and has been authorized for detection and/or diagnosis of SARS-CoV-2 by FDA under an Emergency Use Authorization (EUA).  This EUA will remain in effect (meaning this test can be used) for the duration of the COVID-19 declaration under Section 564(b)(1) of the Act, 21 U.S.C. section 360bbb-3(b)(1), unless the authorization is terminated or revoked sooner. Performed at [facility], [place] [address] [place], [place] [address]      Radiological Exams on Admission: Dg Chest [facility] 1 View  Result Date:  08/04/2018 CLINICAL DATA:  Chest pain EXAM: PORTABLE CHEST 1 VIEW COMPARISON:  May 24, 2018. FINDINGS: No edema or consolidation. Heart size and pulmonary vascularity are normal. No adenopathy. There is evidence of old trauma involving the medial left clavicle with remodeling. No pneumothorax. IMPRESSION: No edema or consolidation.  Stable cardiac silhouette. Electronically Signed   By: Lowella Grip III M.D.   On: 08/04/2018 20:27    EKG: Independently reviewed.  Sinus rhythm at a rate of 85bpm  Assessment/Plan Principal Problem:   Acute GI bleeding Active Problems:   Alcohol dependence with alcohol-induced mood disorder (HCC)   Alcoholic hepatitis without ascites   Thrombocytopenia (HCC)   Tobacco abuse   Essential hypertension   Alcohol withdrawal (HCC)   Anemia   Transaminitis   Abdominal pain   Nausea and vomiting   Acute GI bleed Abdominal pain, nausea, vomiting Patient complained of vomiting of blood about 4 days ago, occult blood in the ED was positive, complaining of abdominal pain which  probably was due to gastritis related to alcohol consumption, initial chest pain was possibly due to GERD.  Patient currently not complaining of any chest pain. Patient was started on IV Protonix and IV octreotide per gastroenterologist recommendation per ED physician, which I continue with same at this time Patient will be kept n.p.o in anticipation for possible endoscopy in the morning Continue IV hydration Continue IV Zofran as needed  Alcohol dependence with alcohol induced mood disorder Alcoholic hepatitis without ascites Alcohol withdrawal Patient has a history of alcohol withdrawal.  Last alcohol consumption was few hours prior to presentation to the ED per patient.  alcohol level will be checked.  Continue thiamine, multivitamin and folic acid once patient is able to tolerate oral intake Patient will be placed on CIWA protocol Ethyl alcohol level was 378mg /dl  Pancytopenia /thrombocytopenia Anemia Patient presents with H/H 9.7/30.49 (this was 10.1/33.1, 2 months ago, WC 3.4 and platelets 80 (104,  2 months ago) Continue management as described above for acute GI bleed Anemia work-up will be done  Essential hypertension (controlled)  Patient was not on any home meds for hypertension per med rec  DVT prophylaxis: SCDs Code Status: Full Family Communication: None at bedside  Disposition Plan: Patient will be discharged possibly within 48 to 72 hours with help of social worker considering the patient is homeless.  Consults called: [place] gastroenterologist (Dr. Rush Landmark)  Admission status: Inpatient    Bernadette Hoit MD [facility] If 7PM-7AM, please contact night-coverage 08/05/2018, 1:04 AM

## 2018-08-05 NOTE — Progress Notes (Signed)
Text paged Dr. Josephine Cables. Given verbal order for NPO to now be with some ice chips. Will continue to monitor. Lajoyce Corners, RN

## 2018-08-05 NOTE — Progress Notes (Signed)
PROGRESS NOTE    Barry Moore  BLT:[id number] DOB: 21-Feb-1966 DOA: 08/04/2018 PCP: Patient, No Pcp Per   Brief Narrative:  HPI On 08/05/2018 by Dr. Bernadette Hoit Barry Moore is a 53 y.o. homeless male with medical history significant of alcohol abuse, alcoholic hepatitis, hypertension, anemia and GI bleed who presents to the emergency department via EMS due to sharp abdominal pain in the epigastric area that has been ongoing for several days.  Apparently, it appears that patient complained of chest pain on arrival to the ED but he only complains of sharp abdominal pain in the epigastric area that has been ongoing for about 4 days, he states that he vomited blood about 4 days ago.  Patient admits to about 7- 8 40oz beer daily, with last consumption being shortly prior to arrival at the ED. Lying  still without movement appears to improve the pain, there was no aggravating factor.  He denies fever, chills, shortness of breath, diarrhea, numbness or tingling.  Assessment & Plan   Admitted earlier today by Dr. Josephine Cables. See H&P for details  GI bleed with abdominal pain nausea and vomiting -He has noticed dark stools recently along with vomiting of blood a few days prior to admission -FOBT positive -Suspect GI bleed secondary to alcohol consumption, ?  Gastritis -On IV Protonix and octreotide -Gastroenterology consulted and appreciated -Continue IV fluids, antiemetics as needed -Currently n.p.o. -Monitor hemoglobin -Of note, patient did have EGD 11/02/2016 showing hiatal hernia, nonobstructing and mild Schatzki ring.  Portal hypertensive gastropathy.  1 nonbleeding duodenal ulcer with no stigmata of bleeding.  Duodenitis.  Chronic microcytic anemia/pancytopenia -Likely due to alcohol intake -Hemoglobin currently 9.7 (upon review of patient's chart, baseline hemoglobin approximately 9-10) -Continue to monitor CBC  Alcohol dependence -Last alcohol consumption was hours prior to admission  -Patient states that he drinks "too much" -Alcohol level 196 -Continue CIWA protocol, thiamine, folic acid, multivitamin -Monitor for withdrawal symptoms  Essential hypertension -Not on any home medications, will place on IV hydralazine as needed  DVT Prophylaxis  SCDs  Code Status: Full  Family Communication: None at bedside  Disposition Plan: Admitted. Pending GI workup/recommendatins. Suspect home on discharge  Consultants Gastroenterology  Procedures  None  Antibiotics   Anti-infectives (From admission, onward)   Start     Dose/Rate Route Frequency Ordered Stop   08/04/18 2200  cefTRIAXone (ROCEPHIN) 1 g in sodium chloride 0.9 % 100 mL IVPB     1 g 200 mL/hr over 30 Minutes Intravenous  Once 08/04/18 2154 08/04/18 2317      Subjective:   Barry Moore seen and examined today.  Patient states that he is thirsty and hungry.  Currently denies any further nausea or vomiting or abdominal pain.  Denies any alcohol withdrawal symptoms, states he knows when they will occur.  Denies chest pain, shortness of breath, dizziness or headache.  Objective:   Vitals:   08/05/18 0058 08/05/18 0100 08/05/18 0404 08/05/18 0800  BP: 119/69 119/69 122/86 (!) 141/91  Pulse: 96 97 88 73  Resp: 15  17 14   Temp: 99.1 F (37.3 C)  99.5 F (37.5 C) 98.6 F (37 C)  TempSrc: Oral  Oral Oral  SpO2: 96%  99% 97%  Weight: 70.8 kg     Height: 5\' 7"  (1.702 m)       Intake/Output Summary (Last 24 hours) at 08/05/2018 0901 Last data filed at 08/05/2018 0600 Gross per 24 hour  Intake 236.52 ml  Output 600 ml  Net -363.48  ml   Filed Weights   08/04/18 1716 08/05/18 0058  Weight: 79.4 kg 70.8 kg    Exam  General: Well developed, well nourished, NAD, unkept  HEENT: NCAT, mucous membranes moist. Poor dentition  Neck: Supple  Cardiovascular: S1 S2 auscultated, RRR, no murmur  Respiratory: Clear to auscultation bilaterally with equal chest rise  Abdomen: Soft, epigastric TTP,  nondistended, + bowel sounds, no rebound or guarding   Extremities: warm dry without cyanosis clubbing or edema  Neuro: AAOx3, nonfocal  Psych: Normal affect and demeanor    Data Reviewed: I have personally reviewed following labs and imaging studies  CBC: Recent Labs  Lab 08/04/18 2042  WBC 3.4*  NEUTROABS 1.4*  HGB 9.7*  HCT 30.4*  MCV 77.2*  PLT 80*   Basic Metabolic Panel: Recent Labs  Lab 08/04/18 2042  NA 137  K 3.8  CL 105  CO2 17*  GLUCOSE 98  BUN <5*  CREATININE 0.66  CALCIUM 8.3*   GFR: Estimated Creatinine Clearance: 99.8 mL/min (by C-G formula based on SCr of 0.66 mg/dL). Liver Function Tests: Recent Labs  Lab 08/04/18 2042  AST 139*  ALT 32  ALKPHOS 137*  BILITOT 1.1  PROT 7.9  ALBUMIN 3.3*   Recent Labs  Lab 08/04/18 2042  LIPASE 48   No results for input(s): AMMONIA in the last 168 hours. Coagulation Profile: Recent Labs  Lab 08/04/18 2343  INR 1.2   Cardiac Enzymes: Recent Labs  Lab 08/04/18 2042  TROPONINI <0.03   BNP (last 3 results) No results for input(s): PROBNP in the last 8760 hours. HbA1C: No results for input(s): HGBA1C in the last 72 hours. CBG: No results for input(s): GLUCAP in the last 168 hours. Lipid Profile: No results for input(s): CHOL, HDL, LDLCALC, TRIG, CHOLHDL, LDLDIRECT in the last 72 hours. Thyroid Function Tests: No results for input(s): TSH, T4TOTAL, FREET4, T3FREE, THYROIDAB in the last 72 hours. Anemia Panel: Recent Labs    08/05/18 0226  FERRITIN 13*  TIBC 451*  IRON 14*   Urine analysis:    Component Value Date/Time   COLORURINE COLORLESS (A) 12/11/2017 2200   APPEARANCEUR CLEAR 12/11/2017 2200   LABSPEC 1.001 (L) 12/11/2017 2200   PHURINE 6.0 12/11/2017 2200   GLUCOSEU NEGATIVE 12/11/2017 2200   HGBUR NEGATIVE 12/11/2017 2200   BILIRUBINUR NEGATIVE 12/11/2017 2200   KETONESUR NEGATIVE 12/11/2017 2200   PROTEINUR NEGATIVE 12/11/2017 2200   NITRITE NEGATIVE 12/11/2017 2200    LEUKOCYTESUR NEGATIVE 12/11/2017 2200   Sepsis Labs: @LABRCNTIP (procalcitonin:4,lacticidven:4)  ) Recent Results (from the past 240 hour(s))  SARS Coronavirus 2 (CEPHEID - Performed in [place] hospital lab), Hosp Order     Status: None   Collection Time: 08/04/18 10:07 PM  Result Value Ref Range Status   SARS Coronavirus 2 NEGATIVE NEGATIVE Final    Comment: (NOTE) If result is NEGATIVE SARS-CoV-2 target nucleic acids are NOT DETECTED. The SARS-CoV-2 RNA is generally detectable in upper and lower  respiratory specimens during the acute phase of infection. The lowest  concentration of SARS-CoV-2 viral copies this assay can detect is 250  copies / mL. A negative result does not preclude SARS-CoV-2 infection  and should not be used as the sole basis for treatment or other  patient management decisions.  A negative result may occur with  improper specimen collection / handling, submission of specimen other  than nasopharyngeal swab, presence of viral mutation(s) within the  areas targeted by this assay, and inadequate number of viral copies  (<  250 copies / mL). A negative result must be combined with clinical  observations, patient history, and epidemiological information. If result is POSITIVE SARS-CoV-2 target nucleic acids are DETECTED. The SARS-CoV-2 RNA is generally detectable in upper and lower  respiratory specimens dur ing the acute phase of infection.  Positive  results are indicative of active infection with SARS-CoV-2.  Clinical  correlation with patient history and other diagnostic information is  necessary to determine patient infection status.  Positive results do  not rule out bacterial infection or co-infection with other viruses. If result is PRESUMPTIVE POSTIVE SARS-CoV-2 nucleic acids MAY BE PRESENT.   A presumptive positive result was obtained on the submitted specimen  and confirmed on repeat testing.  While 2019 novel coronavirus  (SARS-CoV-2) nucleic acids  may be present in the submitted sample  additional confirmatory testing may be necessary for epidemiological  and / or clinical management purposes  to differentiate between  SARS-CoV-2 and other Sarbecovirus currently known to infect humans.  If clinically indicated additional testing with an alternate test  methodology [phone]) is advised. The SARS-CoV-2 RNA is generally  detectable in upper and lower respiratory sp ecimens during the acute  phase of infection. The expected result is Negative. Fact Sheet for Patients:  StrictlyIdeas.no Fact Sheet for Healthcare Providers: BankingDealers.co.za This test is not yet approved or cleared by the [place] FDA and has been authorized for detection and/or diagnosis of SARS-CoV-2 by FDA under an Emergency Use Authorization (EUA).  This EUA will remain in effect (meaning this test can be used) for the duration of the COVID-19 declaration under Section 564(b)(1) of the Act, 21 U.S.C. section 360bbb-3(b)(1), unless the authorization is terminated or revoked sooner. Performed at [facility], [place] [address], [place], [place] [address]       Radiology Studies: Dg Chest Port 1 View  Result Date: 08/04/2018 CLINICAL DATA:  Chest pain EXAM: PORTABLE CHEST 1 VIEW COMPARISON:  May 24, 2018. FINDINGS: No edema or consolidation. Heart size and pulmonary vascularity are normal. No adenopathy. There is evidence of old trauma involving the medial left clavicle with remodeling. No pneumothorax. IMPRESSION: No edema or consolidation.  Stable cardiac silhouette. Electronically Signed   By: Lowella Grip III M.D.   On: 08/04/2018 20:27     Scheduled Meds: . folic acid  1 mg Oral Daily  . multivitamin with minerals  1 tablet Oral Daily  . octreotide  50 mcg Intravenous Once  . thiamine  100 mg Oral Daily   Continuous Infusions: . octreotide  (SANDOSTATIN)    IV infusion 50 mcg/hr (08/05/18  0322)  . pantoprozole (PROTONIX) infusion Stopped (08/04/18 2311)     LOS: 0 days   Time Spent in minutes   30 minutes  Arther Heisler D.O. on 08/05/2018 at 9:01 AM  Between 7am to 7pm - Please see pager noted on [email or web address]  After 7pm go to [email or web address]  And look for the night coverage person covering for me after hours  [facility] Office  [phone]

## 2018-08-06 ENCOUNTER — Encounter (HOSPITAL_COMMUNITY): Payer: Self-pay

## 2018-08-06 DIAGNOSIS — R109 Unspecified abdominal pain: Secondary | ICD-10-CM

## 2018-08-06 DIAGNOSIS — D696 Thrombocytopenia, unspecified: Secondary | ICD-10-CM

## 2018-08-06 DIAGNOSIS — D508 Other iron deficiency anemias: Secondary | ICD-10-CM

## 2018-08-06 DIAGNOSIS — R112 Nausea with vomiting, unspecified: Secondary | ICD-10-CM

## 2018-08-06 DIAGNOSIS — R740 Nonspecific elevation of levels of transaminase and lactic acid dehydrogenase [LDH]: Secondary | ICD-10-CM

## 2018-08-06 DIAGNOSIS — D649 Anemia, unspecified: Secondary | ICD-10-CM

## 2018-08-06 DIAGNOSIS — Z720 Tobacco use: Secondary | ICD-10-CM

## 2018-08-06 DIAGNOSIS — F1024 Alcohol dependence with alcohol-induced mood disorder: Secondary | ICD-10-CM

## 2018-08-06 DIAGNOSIS — F10230 Alcohol dependence with withdrawal, uncomplicated: Secondary | ICD-10-CM

## 2018-08-06 LAB — CBC
HCT: 27.8 % — ABNORMAL LOW (ref 39.0–52.0)
Hemoglobin: 8.8 g/dL — ABNORMAL LOW (ref 13.0–17.0)
MCH: 24.4 pg — ABNORMAL LOW (ref 26.0–34.0)
MCHC: 31.7 g/dL (ref 30.0–36.0)
MCV: 77 fL — ABNORMAL LOW (ref 80.0–100.0)
Platelets: 76 10*3/uL — ABNORMAL LOW (ref 150–400)
RBC: 3.61 MIL/uL — ABNORMAL LOW (ref 4.22–5.81)
RDW: 16.6 % — ABNORMAL HIGH (ref 11.5–15.5)
WBC: 3.4 10*3/uL — ABNORMAL LOW (ref 4.0–10.5)
nRBC: 0 % (ref 0.0–0.2)

## 2018-08-06 LAB — BASIC METABOLIC PANEL
Anion gap: 8 (ref 5–15)
Anion gap: 9 (ref 5–15)
BUN: <5 mg/dL — ABNORMAL LOW (ref 6–20)
BUN: <5 mg/dL — ABNORMAL LOW (ref 6–20)
CO2: 24 mmol/L (ref 22–32)
CO2: 23 mmol/L (ref 22–32)
Calcium: 8.3 mg/dL — ABNORMAL LOW (ref 8.9–10.3)
Calcium: 8.5 mg/dL — ABNORMAL LOW (ref 8.9–10.3)
Chloride: 94 mmol/L — ABNORMAL LOW (ref 98–111)
Chloride: 95 mmol/L — ABNORMAL LOW (ref 98–111)
Creatinine, Ser: 0.67 mg/dL (ref 0.61–1.24)
Creatinine, Ser: 0.74 mg/dL (ref 0.61–1.24)
GFR calc Af Amer: >60 mL/min (ref >60)
GFR calc Af Amer: >60 mL/min (ref >60)
GFR calc non Af Amer: >60 mL/min (ref >60)
GFR calc non Af Amer: >60 mL/min (ref >60)
Glucose, Bld: 114 mg/dL — ABNORMAL HIGH (ref 70–99)
Glucose, Bld: 83 mg/dL (ref 70–99)
Potassium: 3.5 mmol/L (ref 3.5–5.1)
Potassium: 3.1 mmol/L — ABNORMAL LOW (ref 3.5–5.1)
Sodium: 126 mmol/L — ABNORMAL LOW (ref 135–145)
Sodium: 127 mmol/L — ABNORMAL LOW (ref 135–145)

## 2018-08-06 MED ORDER — ACETAMINOPHEN 325 MG PO TABS
650.0000 mg | ORAL_TABLET | ORAL | Status: DC | PRN
  Administered 2018-08-06: 650 mg via ORAL
  Filled 2018-08-06: qty 2

## 2018-08-06 MED ORDER — SODIUM CHLORIDE 0.9 % IV SOLN
INTRAVENOUS | Status: DC
  Administered 2018-08-06 – 2018-08-09 (×7): via INTRAVENOUS

## 2018-08-06 MED ORDER — PEG-KCL-NACL-NASULF-NA ASC-C 100 G PO SOLR
0.5000 | Freq: Once | ORAL | Status: AC
  Administered 2018-08-06: 100 g via ORAL
  Filled 2018-08-06: qty 1

## 2018-08-06 MED ORDER — HYDRALAZINE HCL 20 MG/ML IJ SOLN: 10.0000 mg | Freq: Four times a day (QID) | INTRAMUSCULAR | Status: DC | PRN

## 2018-08-06 MED ORDER — PEG-KCL-NACL-NASULF-NA ASC-C 100 G PO SOLR: 1.0000 | Freq: Once | ORAL | Status: DC

## 2018-08-06 NOTE — H&P (View-Only) (Signed)
     [facility] Progress Note  CC:  Epigastric pain, GI bleed/melena  Subjective: No nausea or abdominal pain. Tolerating clear liquids. Passed a small BM today, no blood or melena.  Objective:  Vital signs in last 24 hours: Temp:  [98.5 F (36.9 C)-99.2 F (37.3 C)] 98.5 F (36.9 C) (05/17 1621) Pulse Rate:  [71-80] 80 (05/17 1621) Resp:  [19-26] 20 (05/17 1621) BP: (87-149)/(61-90) 128/89 (05/17 1621) SpO2:  [99 %-100 %] 100 % (05/17 1621) Last BM Date: 08/04/18 General:   Alert in NAD Heart:  Regular rate and rhythm; no murmurs Pulm: clear throughout Abdomen:  Soft, nontender and nondistended. Normal bowel sounds, without guarding, and without rebound.   Extremities:  Without edema. Neurologic:  Alert and  oriented x4;  grossly normal neurologically. Psych:  Alert and cooperative. Normal mood and affect.  Intake/Output from previous day: 05/16 0701 - 05/17 0700 In: 1308.3 [P.O.:860; I.V.:448.3] Out: 825 [Urine:825] Intake/Output this shift: Total I/O In: 901.7 [P.O.:420; I.V.:481.7] Out: 400 [Urine:400]  Lab Results: Recent Labs    08/04/18 2042 08/05/18 0944 08/06/18 0336  WBC 3.4* 2.8* 3.4*  HGB 9.7* 8.1* 8.8*  HCT 30.4* 25.7* 27.8*  PLT 80* 83* 76*   BMET Recent Labs    08/04/18 2042 08/05/18 0944 08/06/18 0336  NA 137 134* 126*  K 3.8 3.7 3.5  CL 105 100 94*  CO2 17* 20* 24  GLUCOSE 98 107* 114*  BUN <5* <5* <5*  CREATININE 0.66 0.71 0.67  CALCIUM 8.3* 7.7* 8.3*   LFT Recent Labs    08/05/18 0944  PROT 6.9  ALBUMIN 3.0*  AST 139*  ALT 31  ALKPHOS 120  BILITOT 1.3*   PT/INR Recent Labs    08/04/18 2343  LABPROT 14.7  INR 1.2   Hepatitis Panel No results for input(s): HEPBSAG, HCVAB, HEPAIGM, HEPBIGM in the last 72 hours.  Dg Chest Port 1 View  Result Date: 08/04/2018 CLINICAL DATA:  Chest pain EXAM: PORTABLE CHEST 1 VIEW COMPARISON:  May 24, 2018. FINDINGS: No edema or consolidation. Heart size and pulmonary  vascularity are normal. No adenopathy. There is evidence of old trauma involving the medial left clavicle with remodeling. No pneumothorax. IMPRESSION: No edema or consolidation.  Stable cardiac silhouette. Electronically Signed   By: Lowella Grip III M.D.   On: 08/04/2018 20:27    Assessment / Plan:  1. 53 y.o. male with a hx of Etoh hepatitis, pancreatitis, cocaine use, nonbleeding duodenal ulcer per EGD presents with central/epigastsric pain, GI bleed, melena and IDA. Hg 9.7. Iron 14. BUN <5. EGD 5/17 showed grade I esophageal varices, esophagitis, portal hypertensive gastropathy, erosive gastropathy, a duodenal ulcer with a clean base. Octreotide dc'd. PPI  Po bid.  Hg 8.8 >>8.1. No further melena.  -repeat EGD in 3 months -colonoscopy 08/07/2018 -Movi Prep -NPO after midnight  -repeat CBC in am -transfuse for Hg < 7  2. Pancytopenia: WBC 3.4. RBC 3.61. Hg 9.7. PlT 80.   3. Alcohol abuse, active drug use at risk for Etoh withdrawl -CIWA protocol -monitor for withdrawal   4. Hyponatremia. Sodium 126 -Repeat BMP in am     LOS: 1 day   Noralyn Pick  08/06/2018, 4:31 PM

## 2018-08-06 NOTE — Anesthesia Preprocedure Evaluation (Addendum)
Anesthesia Evaluation  Patient identified by MRN, date of birth, ID band Patient awake    Reviewed: Allergy & Precautions, NPO status , Patient's Chart, lab work & pertinent test results  History of Anesthesia Complications Negative for: history of anesthetic complications  Airway Mallampati: II  TM Distance: >3 FB Neck ROM: Full   Comment:  Full beard  Dental  (+) Edentulous Lower, Edentulous Upper   Pulmonary Current Smoker,    breath sounds clear to auscultation       Cardiovascular hypertension,  Rhythm:Regular Rate:Normal     Neuro/Psych Seizures -, Poorly Controlled,  PSYCHIATRIC DISORDERS Depression  Hx suicidal behavior    GI/Hepatic negative GI ROS, (+)     substance abuse  alcohol use, Hepatitis - (alcoholic)  Endo/Other   Hyponatremia   Renal/GU negative Renal ROS     Musculoskeletal negative musculoskeletal ROS (+)   Abdominal   Peds  Hematology  (+) anemia ,  Thrombocytopenia    Anesthesia Other Findings   Reproductive/Obstetrics                            Anesthesia Physical Anesthesia Plan  ASA: III  Anesthesia Plan: MAC   Post-op Pain Management:    Induction: Intravenous  PONV Risk Score and Plan: 1 and Propofol infusion and Treatment may vary due to age or medical condition  Airway Management Planned: Nasal Cannula and Simple Face Mask  Additional Equipment: None  Intra-op Plan:   Post-operative Plan:   Informed Consent: I have reviewed the patients History and Physical, chart, labs and discussed the procedure including the risks, benefits and alternatives for the proposed anesthesia with the patient or authorized representative who has indicated his/her understanding and acceptance.       Plan Discussed with: CRNA and Anesthesiologist  Anesthesia Plan Comments:        Anesthesia Quick Evaluation

## 2018-08-06 NOTE — Progress Notes (Addendum)
PROGRESS NOTE    Martel Galvan  URK:[id number] DOB: 1966-01-20 DOA: 08/04/2018 PCP: Patient, No Pcp Per   Brief Narrative:  HPI On 08/05/2018 by Dr. Bernadette Hoit Nilan Iddings is a 53 y.o. homeless male with medical history significant of alcohol abuse, alcoholic hepatitis, hypertension, anemia and GI bleed who presents to the emergency department via EMS due to sharp abdominal pain in the epigastric area that has been ongoing for several days.  Apparently, it appears that patient complained of chest pain on arrival to the ED but he only complains of sharp abdominal pain in the epigastric area that has been ongoing for about 4 days, he states that he vomited blood about 4 days ago.  Patient admits to about 7- 8 40oz beer daily, with last consumption being shortly prior to arrival at the ED. Lying  still without movement appears to improve the pain, there was no aggravating factor.  He denies fever, chills, shortness of breath, diarrhea, numbness or tingling.  Interim history Admitted with GI bleed.  Status post EGD.  GI planning for colonoscopy on 08/07/2018.  Assessment & Plan   GI bleed with abdominal pain nausea and vomiting -He has noticed dark stools recently along with vomiting of blood a few days prior to admission -FOBT positive -Suspect GI bleed secondary to alcohol consumption, ?  Gastritis -Gastroenterology consulted and appreciated -s/p EGD: White nummular lesions in the entire esophagus.  Biopsies taken.  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  LA grade B esophagitis with no bleeding found in the distal esophagus.  Salmon-colored mucosa suspicious for short segment Barrett's esophagus.  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  Multiple dispersed small erosions in the gastric body and antrum.  Biopsies taken.  H. pylori testing.  1 superficial duodenal ulcer clean ulcer base, duodenal bulb (Forrest class III), 6 mm.  -Gastroenterology recommended PPI twice daily.  Discontinue octreotide.  Would need repeat EGD in 3 months.  Colonoscopy. -Continue IV fluids, antiemetics as needed -Monitor hemoglobin -Will place patient on clear liquid diet today in preparation for colonoscopy on 08/07/2018  Chronic microcytic anemia/pancytopenia -Likely due to alcohol intake -Hemoglobin currently 8.8 (upon review of patient's chart, baseline hemoglobin approximately 9-10) -Anemia panel: iron def, 14. Ferritin 13.   -Will place patient on iron supplementation after colonoscopy and will also order Feraheme -Continue to monitor CBC  Alcohol dependence with possible withdrawal -Last alcohol consumption was hours prior to admission -Patient states that he drinks "too much" -Alcohol level 196 -Continue CIWA protocol, thiamine, folic acid, multivitamin -Monitor for withdrawal symptoms- patient feels he is starting to withdraw and complains of sweating   Essential hypertension -Not on any home medications, will place on IV hydralazine as needed  DVT Prophylaxis  SCDs  Code Status: Full  Family Communication: None at bedside  Disposition Plan: Admitted. Pending GI workup/recommendatins. Suspect home on discharge  Consultants Gastroenterology  Procedures  EGD  Antibiotics   Anti-infectives (From admission, onward)   Start     Dose/Rate Route Frequency Ordered Stop   08/04/18 2200  cefTRIAXone (ROCEPHIN) 1 g in sodium chloride 0.9 % 100 mL IVPB     1 g 200 mL/hr over 30 Minutes Intravenous  Once 08/04/18 2154 08/04/18 2317      Subjective:   Nolyn Tiedeman seen and examined today.  Denies current chest pain, shortness of breath, abdominal pain, further nausea or vomiting, dizziness or headache.  Has not noted any further melena  since admission.  Does feel very sweaty and feels that he is starting to have withdrawal symptoms. Objective:   Vitals:   08/05/18 1946 08/06/18 0025 08/06/18 0438 08/06/18 0817   BP: (!) 87/61 (!) 145/84 (!) 149/90   Pulse: 71 71 74   Resp: 20 (!) 26 (!) 24   Temp: 99 F (37.2 C) 99.2 F (37.3 C) 98.7 F (37.1 C) 99 F (37.2 C)  TempSrc: Oral Oral Oral Oral  SpO2: 100% 100% 99%   Weight:      Height:        Intake/Output Summary (Last 24 hours) at 08/06/2018 0831 Last data filed at 08/06/2018 0818 Gross per 24 hour  Intake 1548.33 ml  Output 825 ml  Net 723.33 ml   Filed Weights   08/04/18 1716 08/05/18 0058 08/05/18 1137  Weight: 79.4 kg 70.8 kg 70.8 kg   Exam  General: Well developed, well nourished, NAD, unkept  HEENT: NCAT, mucous membranes moist.  Poor dentition  Neck: Supple  Cardiovascular: S1 S2 auscultated, RRR, no murmur  Respiratory: Clear to auscultation bilaterally  Abdomen: Soft, mildly TTP, nondistended, + bowel sounds  Extremities: warm dry without cyanosis clubbing or edema  Neuro: AAOx3, nonfocal  Psych: Appropriate mood and affect  Data Reviewed: I have personally reviewed following labs and imaging studies  CBC: Recent Labs  Lab 08/04/18 2042 08/05/18 0944 08/06/18 0336  WBC 3.4* 2.8* 3.4*  NEUTROABS 1.4* 1.4*  --   HGB 9.7* 8.1* 8.8*  HCT 30.4* 25.7* 27.8*  MCV 77.2* 78.1* 77.0*  PLT 80* 83* 76*   Basic Metabolic Panel: Recent Labs  Lab 08/04/18 2042 08/05/18 0944 08/06/18 0336  NA 137 134* 126*  K 3.8 3.7 3.5  CL 105 100 94*  CO2 17* 20* 24  GLUCOSE 98 107* 114*  BUN <5* <5* <5*  CREATININE 0.66 0.71 0.67  CALCIUM 8.3* 7.7* 8.3*   GFR: Estimated Creatinine Clearance: 103.3 mL/min (by C-G formula based on SCr of 0.67 mg/dL). Liver Function Tests: Recent Labs  Lab 08/04/18 2042 08/05/18 0944  AST 139* 139*  ALT 32 31  ALKPHOS 137* 120  BILITOT 1.1 1.3*  PROT 7.9 6.9  ALBUMIN 3.3* 3.0*   Recent Labs  Lab 08/04/18 2042  LIPASE 48   No results for input(s): AMMONIA in the last 168 hours. Coagulation Profile: Recent Labs  Lab 08/04/18 2343  INR 1.2   Cardiac Enzymes: Recent  Labs  Lab 08/04/18 2042  TROPONINI <0.03   BNP (last 3 results) No results for input(s): PROBNP in the last 8760 hours. HbA1C: No results for input(s): HGBA1C in the last 72 hours. CBG: No results for input(s): GLUCAP in the last 168 hours. Lipid Profile: No results for input(s): CHOL, HDL, LDLCALC, TRIG, CHOLHDL, LDLDIRECT in the last 72 hours. Thyroid Function Tests: No results for input(s): TSH, T4TOTAL, FREET4, T3FREE, THYROIDAB in the last 72 hours. Anemia Panel: Recent Labs    08/05/18 0226  FERRITIN 13*  TIBC 451*  IRON 14*   Urine analysis:    Component Value Date/Time   COLORURINE YELLOW 08/05/2018 2014   APPEARANCEUR CLEAR 08/05/2018 2014   LABSPEC 1.012 08/05/2018 2014   [place] 7.0 08/05/2018 2014   [place] 08/05/2018 2014   HGBUR NEGATIVE 08/05/2018 2014   [place] NEGATIVE 08/05/2018 2014   [place] 08/05/2018 2014   PROTEINUR NEGATIVE 08/05/2018 2014   NITRITE NEGATIVE 08/05/2018 2014   LEUKOCYTESUR NEGATIVE 08/05/2018 2014   Sepsis Labs: @LABRCNTIP (procalcitonin:4,lacticidven:4)  ) Recent  Results (from the past 240 hour(s))  SARS Coronavirus 2 (CEPHEID - Performed in [facility] hospital lab), Hosp Order     Status: None   Collection Time: 08/04/18 10:07 PM  Result Value Ref Range Status   SARS Coronavirus 2 NEGATIVE NEGATIVE Final    Comment: (NOTE) If result is NEGATIVE SARS-CoV-2 target nucleic acids are NOT DETECTED. The SARS-CoV-2 RNA is generally detectable in upper and lower  respiratory specimens during the acute phase of infection. The lowest  concentration of SARS-CoV-2 viral copies this assay can detect is 250  copies / mL. A negative result does not preclude SARS-CoV-2 infection  and should not be used as the sole basis for treatment or other  patient management decisions.  A negative result may occur with  improper specimen collection / handling, submission of specimen other  than nasopharyngeal swab,  presence of viral mutation(s) within the  areas targeted by this assay, and inadequate number of viral copies  (<250 copies / mL). A negative result must be combined with clinical  observations, patient history, and epidemiological information. If result is POSITIVE SARS-CoV-2 target nucleic acids are DETECTED. The SARS-CoV-2 RNA is generally detectable in upper and lower  respiratory specimens dur ing the acute phase of infection.  Positive  results are indicative of active infection with SARS-CoV-2.  Clinical  correlation with patient history and other diagnostic information is  necessary to determine patient infection status.  Positive results do  not rule out bacterial infection or co-infection with other viruses. If result is PRESUMPTIVE POSTIVE SARS-CoV-2 nucleic acids MAY BE PRESENT.   A presumptive positive result was obtained on the submitted specimen  and confirmed on repeat testing.  While 2019 novel coronavirus  (SARS-CoV-2) nucleic acids may be present in the submitted sample  additional confirmatory testing may be necessary for epidemiological  and / or clinical management purposes  to differentiate between  SARS-CoV-2 and other Sarbecovirus currently known to infect humans.  If clinically indicated additional testing with an alternate test  methodology [phone]) is advised. The SARS-CoV-2 RNA is generally  detectable in upper and lower respiratory sp ecimens during the acute  phase of infection. The expected result is Negative. Fact Sheet for Patients:  StrictlyIdeas.no Fact Sheet for Healthcare Providers: BankingDealers.co.za This test is not yet approved or cleared by the [place] FDA and has been authorized for detection and/or diagnosis of SARS-CoV-2 by FDA under an Emergency Use Authorization (EUA).  This EUA will remain in effect (meaning this test can be used) for the duration of the COVID-19 declaration under  Section 564(b)(1) of the Act, 21 U.S.C. section 360bbb-3(b)(1), unless the authorization is terminated or revoked sooner. Performed at [facility], [place] [address] [place], [place] [address]       Radiology Studies: Dg Chest Port 1 View  Result Date: 08/04/2018 CLINICAL DATA:  Chest pain EXAM: PORTABLE CHEST 1 VIEW COMPARISON:  May 24, 2018. FINDINGS: No edema or consolidation. Heart size and pulmonary vascularity are normal. No adenopathy. There is evidence of old trauma involving the medial left clavicle with remodeling. No pneumothorax. IMPRESSION: No edema or consolidation.  Stable cardiac silhouette. Electronically Signed   By: Lowella Grip III M.D.   On: 08/04/2018 20:27     Scheduled Meds: . folic acid  1 mg Oral Daily  . multivitamin with minerals  1 tablet Oral Daily  . pantoprazole  40 mg Oral BID  . thiamine  100 mg Oral Daily   Continuous  Infusions: . sodium chloride       LOS: 1 day   Time Spent in minutes   30 minutes  [place]oka Stringfield D.O. on 08/06/2018 at 8:31 AM  Between 7am to 7pm - Please see pager noted on [email or web address]  After 7pm go to [email or web address]  And look for the night coverage person covering for me after hours  [facility] Office  [phone]

## 2018-08-06 NOTE — Progress Notes (Signed)
     [facility] Progress Note  CC:  Epigastric pain, GI bleed/melena  Subjective: No nausea or abdominal pain. Tolerating clear liquids. Passed a small BM today, no blood or melena.  Objective:  Vital signs in last 24 hours: Temp:  [98.5 F (36.9 C)-99.2 F (37.3 C)] 98.5 F (36.9 C) (05/17 1621) Pulse Rate:  [71-80] 80 (05/17 1621) Resp:  [19-26] 20 (05/17 1621) BP: (87-149)/(61-90) 128/89 (05/17 1621) SpO2:  [99 %-100 %] 100 % (05/17 1621) Last BM Date: 08/04/18 General:   Alert in NAD Heart:  Regular rate and rhythm; no murmurs Pulm: clear throughout Abdomen:  Soft, nontender and nondistended. Normal bowel sounds, without guarding, and without rebound.   Extremities:  Without edema. Neurologic:  Alert and  oriented x4;  grossly normal neurologically. Psych:  Alert and cooperative. Normal mood and affect.  Intake/Output from previous day: 05/16 0701 - 05/17 0700 In: 1308.3 [P.O.:860; I.V.:448.3] Out: 825 [Urine:825] Intake/Output this shift: Total I/O In: 901.7 [P.O.:420; I.V.:481.7] Out: 400 [Urine:400]  Lab Results: Recent Labs    08/04/18 2042 08/05/18 0944 08/06/18 0336  WBC 3.4* 2.8* 3.4*  HGB 9.7* 8.1* 8.8*  HCT 30.4* 25.7* 27.8*  PLT 80* 83* 76*   BMET Recent Labs    08/04/18 2042 08/05/18 0944 08/06/18 0336  NA 137 134* 126*  K 3.8 3.7 3.5  CL 105 100 94*  CO2 17* 20* 24  GLUCOSE 98 107* 114*  BUN <5* <5* <5*  CREATININE 0.66 0.71 0.67  CALCIUM 8.3* 7.7* 8.3*   LFT Recent Labs    08/05/18 0944  PROT 6.9  ALBUMIN 3.0*  AST 139*  ALT 31  ALKPHOS 120  BILITOT 1.3*   PT/INR Recent Labs    08/04/18 2343  LABPROT 14.7  INR 1.2   Hepatitis Panel No results for input(s): HEPBSAG, HCVAB, HEPAIGM, HEPBIGM in the last 72 hours.  Dg Chest Port 1 View  Result Date: 08/04/2018 CLINICAL DATA:  Chest pain EXAM: PORTABLE CHEST 1 VIEW COMPARISON:  May 24, 2018. FINDINGS: No edema or consolidation. Heart size and pulmonary  vascularity are normal. No adenopathy. There is evidence of old trauma involving the medial left clavicle with remodeling. No pneumothorax. IMPRESSION: No edema or consolidation.  Stable cardiac silhouette. Electronically Signed   By: Lowella Grip III M.D.   On: 08/04/2018 20:27    Assessment / Plan:  1. 53 y.o. male with a hx of Etoh hepatitis, pancreatitis, cocaine use, nonbleeding duodenal ulcer per EGD presents with central/epigastsric pain, GI bleed, melena and IDA. Hg 9.7. Iron 14. BUN <5. EGD 5/17 showed grade I esophageal varices, esophagitis, portal hypertensive gastropathy, erosive gastropathy, a duodenal ulcer with a clean base. Octreotide dc'd. PPI  Po bid.  Hg 8.8 >>8.1. No further melena.  -repeat EGD in 3 months -colonoscopy 08/07/2018 -Movi Prep -NPO after midnight  -repeat CBC in am -transfuse for Hg < 7  2. Pancytopenia: WBC 3.4. RBC 3.61. Hg 9.7. PlT 80.   3. Alcohol abuse, active drug use at risk for Etoh withdrawl -CIWA protocol -monitor for withdrawal   4. Hyponatremia. Sodium 126 -Repeat BMP in am     LOS: 1 day   Noralyn Pick  08/06/2018, 4:31 PM

## 2018-08-07 ENCOUNTER — Inpatient Hospital Stay (HOSPITAL_COMMUNITY): Payer: Self-pay | Admitting: Anesthesiology

## 2018-08-07 ENCOUNTER — Other Ambulatory Visit: Payer: Self-pay

## 2018-08-07 ENCOUNTER — Encounter (HOSPITAL_COMMUNITY)
Admission: EM | Disposition: A | Discharge status: Home / Self Care | Payer: Self-pay | Source: Home / Self Care | Attending: Internal Medicine

## 2018-08-07 ENCOUNTER — Encounter (HOSPITAL_COMMUNITY): Payer: Self-pay | Admitting: *Deleted

## 2018-08-07 DIAGNOSIS — D120 Benign neoplasm of cecum: Secondary | ICD-10-CM

## 2018-08-07 DIAGNOSIS — K633 Ulcer of intestine: Secondary | ICD-10-CM

## 2018-08-07 DIAGNOSIS — D122 Benign neoplasm of ascending colon: Secondary | ICD-10-CM

## 2018-08-07 DIAGNOSIS — K635 Polyp of colon: Secondary | ICD-10-CM

## 2018-08-07 HISTORY — PX: POLYPECTOMY: SHX5525

## 2018-08-07 HISTORY — PX: BIOPSY: SHX5522

## 2018-08-07 HISTORY — PX: COLONOSCOPY WITH PROPOFOL: SHX5780

## 2018-08-07 LAB — CBC
HCT: 26.3 % — ABNORMAL LOW (ref 39.0–52.0)
Hemoglobin: 8.2 g/dL — ABNORMAL LOW (ref 13.0–17.0)
MCH: 24.7 pg — ABNORMAL LOW (ref 26.0–34.0)
MCHC: 31.2 g/dL (ref 30.0–36.0)
MCV: 79.2 fL — ABNORMAL LOW (ref 80.0–100.0)
Platelets: 83 10*3/uL — ABNORMAL LOW (ref 150–400)
RBC: 3.32 MIL/uL — ABNORMAL LOW (ref 4.22–5.81)
RDW: 17.2 % — ABNORMAL HIGH (ref 11.5–15.5)
WBC: 4 10*3/uL (ref 4.0–10.5)
nRBC: 0 % (ref 0.0–0.2)

## 2018-08-07 LAB — BASIC METABOLIC PANEL
Anion gap: 8 (ref 5–15)
BUN: <5 mg/dL — ABNORMAL LOW (ref 6–20)
CO2: 19 mmol/L — ABNORMAL LOW (ref 22–32)
Calcium: 8.3 mg/dL — ABNORMAL LOW (ref 8.9–10.3)
Chloride: 104 mmol/L (ref 98–111)
Creatinine, Ser: 0.7 mg/dL (ref 0.61–1.24)
GFR calc Af Amer: >60 mL/min (ref >60)
GFR calc non Af Amer: >60 mL/min (ref >60)
Glucose, Bld: 96 mg/dL (ref 70–99)
Potassium: 3.6 mmol/L (ref 3.5–5.1)
Sodium: 131 mmol/L — ABNORMAL LOW (ref 135–145)

## 2018-08-07 SURGERY — COLONOSCOPY WITH PROPOFOL
Anesthesia: Monitor Anesthesia Care

## 2018-08-07 MED ORDER — LACTATED RINGERS IV SOLN
INTRAVENOUS | Status: DC
  Administered 2018-08-07: 08:00:00 via INTRAVENOUS

## 2018-08-07 MED ORDER — SODIUM CHLORIDE 0.9 % IV SOLN
510.0000 mg | Freq: Once | INTRAVENOUS | Status: AC
  Administered 2018-08-07: 510 mg via INTRAVENOUS
  Filled 2018-08-07: qty 17

## 2018-08-07 MED ORDER — LIDOCAINE 2% (20 MG/ML) 5 ML SYRINGE
INTRAMUSCULAR | Status: DC | PRN
  Administered 2018-08-07: 100 mg via INTRAVENOUS

## 2018-08-07 MED ORDER — LACTATED RINGERS IV SOLN
INTRAVENOUS | Status: DC | PRN
  Administered 2018-08-07: 08:00:00 via INTRAVENOUS

## 2018-08-07 MED ORDER — SODIUM CHLORIDE 0.9 % IV BOLUS
500.0000 mL | Freq: Once | INTRAVENOUS | Status: AC
  Administered 2018-08-07: 02:00:00 500 mL via INTRAVENOUS

## 2018-08-07 MED ORDER — PROPOFOL 10 MG/ML IV BOLUS
INTRAVENOUS | Status: DC | PRN
  Administered 2018-08-07: 50 mg via INTRAVENOUS
  Administered 2018-08-07: 30 mg via INTRAVENOUS
  Administered 2018-08-07: 50 mg via INTRAVENOUS

## 2018-08-07 MED ORDER — PROPOFOL 500 MG/50ML IV EMUL
INTRAVENOUS | Status: DC | PRN
  Administered 2018-08-07: 100 ug/kg/min via INTRAVENOUS

## 2018-08-07 MED ORDER — POTASSIUM CHLORIDE CRYS ER 20 MEQ PO TBCR
40.0000 meq | EXTENDED_RELEASE_TABLET | Freq: Once | ORAL | Status: AC
  Administered 2018-08-07: 10:00:00 40 meq via ORAL
  Filled 2018-08-07 (×2): qty 2

## 2018-08-07 SURGICAL SUPPLY — 22 items

## 2018-08-07 NOTE — Anesthesia Procedure Notes (Signed)
Procedure Name: MAC Date/Time: 08/07/2018 8:11 AM Performed by: Renato Shin, CRNA Pre-anesthesia Checklist: Patient identified, Emergency Drugs available, Suction available and Patient being monitored Patient Re-evaluated:Patient Re-evaluated prior to induction Oxygen Delivery Method: Simple face mask Preoxygenation: Pre-oxygenation with 100% oxygen Induction Type: IV induction Placement Confirmation: positive ETCO2 and breath sounds checked- equal and bilateral Dental Injury: Teeth and Oropharynx as per pre-operative assessment

## 2018-08-07 NOTE — Anesthesia Postprocedure Evaluation (Signed)
Anesthesia Post Note  Patient: Barry Moore  Procedure(s) Performed: COLONOSCOPY WITH PROPOFOL (N/A ) POLYPECTOMY BIOPSY     Patient location during evaluation: PACU Anesthesia Type: MAC Level of consciousness: awake and alert Pain management: pain level controlled Vital Signs Assessment: post-procedure vital signs reviewed and stable Respiratory status: spontaneous breathing, nonlabored ventilation and respiratory function stable Cardiovascular status: stable and blood pressure returned to baseline Anesthetic complications: no    Last Vitals:  Vitals:   08/07/18 0854 08/07/18 0856  BP:  (!) 145/72  Pulse: 85 80  Resp: 20 20  Temp:    SpO2: 100% 100%    Last Pain:  Vitals:   08/07/18 0842  TempSrc: Oral  PainSc: 0-No pain                 Audry Pili

## 2018-08-07 NOTE — Progress Notes (Signed)
PROGRESS NOTE    Kyllian Clingerman  CWU:[id number] DOB: 1965-09-29 DOA: 08/04/2018 PCP: Patient, No Pcp Per   Brief Narrative:  HPI On 08/05/2018 by Dr. Bernadette Hoit Keishawn Rajewski is a 53 y.o. homeless male with medical history significant of alcohol abuse, alcoholic hepatitis, hypertension, anemia and GI bleed who presents to the emergency department via EMS due to sharp abdominal pain in the epigastric area that has been ongoing for several days.  Apparently, it appears that patient complained of chest pain on arrival to the ED but he only complains of sharp abdominal pain in the epigastric area that has been ongoing for about 4 days, he states that he vomited blood about 4 days ago.  Patient admits to about 7- 8 40oz beer daily, with last consumption being shortly prior to arrival at the ED. Lying  still without movement appears to improve the pain, there was no aggravating factor.  He denies fever, chills, shortness of breath, diarrhea, numbness or tingling.  Interim history Admitted with GI bleed.  Status post EGD.  GI planning for colonoscopy today 08/07/2018.  Assessment & Plan   GI bleed with abdominal pain nausea and vomiting -He has noticed dark stools recently along with vomiting of blood a few days prior to admission -FOBT positive -Suspect GI bleed secondary to alcohol consumption, ?  Gastritis -Gastroenterology consulted and appreciated -s/p EGD: White nummular lesions in the entire esophagus.  Biopsies taken.  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  LA grade B esophagitis with no bleeding found in the distal esophagus.  Salmon-colored mucosa suspicious for short segment Barrett's esophagus.  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  Multiple dispersed small erosions in the gastric body and antrum.  Biopsies taken.  H. pylori testing.  1 superficial duodenal ulcer clean ulcer base, duodenal bulb (Forrest class III), 6 mm.  -Gastroenterology recommended PPI twice daily.  Discontinue octreotide.  Would need repeat EGD in 3 months.  Colonoscopy. -Pending biopsy results -Continue IV fluids, antiemetics as needed -Monitor hemoglobin -pending colonoscopy today  Chronic microcytic anemia/pancytopenia -Likely due to alcohol intake -Hemoglobin currently 8.2 (upon review of patient's chart, baseline hemoglobin approximately 9-10) -Anemia panel: iron def, 14. Ferritin 13.   -Will place patient on iron supplementation after colonoscopy and will also order Feraheme after colonoscopy  -platelets stable -Continue to monitor CBC  Alcohol dependence with possible withdrawal -Last alcohol consumption was hours prior to admission -Patient states that he drinks "too much" -Alcohol level 196 -Continue CIWA protocol, thiamine, folic acid, multivitamin -Monitor for withdrawal symptoms- patient feels he is starting to withdraw and complains of sweating   Essential hypertension -Not on any home medications, will place on IV hydralazine as needed  Hypokalemia -resolved with replacement, currently 3.6 -will give additional supplementation today -monitor BMP  Hyponatremia -continue IVF -monitor BMP  DVT Prophylaxis  SCDs  Code Status: Full  Family Communication: None at bedside  Disposition Plan: Admitted. Pending GI workup/recommendatins. Suspect home on discharge  Consultants Gastroenterology  Procedures  EGD  Antibiotics   Anti-infectives (From admission, onward)   Start     Dose/Rate Route Frequency Ordered Stop   08/04/18 2200  cefTRIAXone (ROCEPHIN) 1 g in sodium chloride 0.9 % 100 mL IVPB     1 g 200 mL/hr over 30 Minutes Intravenous  Once 08/04/18 2154 08/04/18 2317      Subjective:   Jaze Vent seen and examined today.  Denies current chest pain, shortness of  breath, abdominal pain, further nausea, vomiting, dizziness, headache. Does have "shakes" and sweating. Feels hungry.  Objective:    Vitals:   08/06/18 1130 08/06/18 1621 08/06/18 1952 08/07/18 0426  BP: 130/87 128/89 (!) 127/97 135/84  Pulse: 72 80 83 82  Resp: 20 20 20  (!) 23  Temp: 98.6 F (37 C) 98.5 F (36.9 C) 98 F (36.7 C) 98.6 F (37 C)  TempSrc: Oral Oral Oral Oral  SpO2: 100% 100% 100% 99%  Weight:      Height:        Intake/Output Summary (Last 24 hours) at 08/07/2018 0740 Last data filed at 08/07/2018 0400 Gross per 24 hour  Intake 4414.77 ml  Output 700 ml  Net 3714.77 ml   Filed Weights   08/04/18 1716 08/05/18 0058 08/05/18 1137  Weight: 79.4 kg 70.8 kg 70.8 kg   Exam  General: Well developed, well nourished, NAD  HEENT: NCAT, mucous membranes moist. Poor dentition   Neck: Supple  Cardiovascular: S1 S2 auscultated, RRR, no murmur  Respiratory: Clear to auscultation bilaterally with equal chest rise  Abdomen: Soft, nontender, nondistended, + bowel sounds  Extremities: warm dry without cyanosis clubbing or edema  Neuro: AAOx3, nonfocal, mild tremor  Psych: Appropriate mood and affect, pleasant    Data Reviewed: I have personally reviewed following labs and imaging studies  CBC: Recent Labs  Lab 08/04/18 2042 08/05/18 0944 08/06/18 0336 08/07/18 0244  WBC 3.4* 2.8* 3.4* 4.0  NEUTROABS 1.4* 1.4*  --   --   HGB 9.7* 8.1* 8.8* 8.2*  HCT 30.4* 25.7* 27.8* 26.3*  MCV 77.2* 78.1* 77.0* 79.2*  PLT 80* 83* 76* 83*   Basic Metabolic Panel: Recent Labs  Lab 08/04/18 2042 08/05/18 0944 08/06/18 0336 08/06/18 2010 08/07/18 0244  NA 137 134* 126* 127* 131*  K 3.8 3.7 3.5 3.1* 3.6  CL 105 100 94* 95* 104  CO2 17* 20* 24 23 19*  GLUCOSE 98 107* 114* 83 96  BUN <5* <5* <5* <5* <5*  CREATININE 0.66 0.71 0.67 0.74 0.70  CALCIUM 8.3* 7.7* 8.3* 8.5* 8.3*   GFR: Estimated Creatinine Clearance: 103.3 mL/min (by C-G formula based on SCr of 0.7 mg/dL). Liver Function Tests: Recent Labs  Lab 08/04/18 2042 08/05/18 0944  AST 139* 139*  ALT 32 31  ALKPHOS 137* 120   BILITOT 1.1 1.3*  PROT 7.9 6.9  ALBUMIN 3.3* 3.0*   Recent Labs  Lab 08/04/18 2042  LIPASE 48   No results for input(s): AMMONIA in the last 168 hours. Coagulation Profile: Recent Labs  Lab 08/04/18 2343  INR 1.2   Cardiac Enzymes: Recent Labs  Lab 08/04/18 2042  TROPONINI <0.03   BNP (last 3 results) No results for input(s): PROBNP in the last 8760 hours. HbA1C: No results for input(s): HGBA1C in the last 72 hours. CBG: No results for input(s): GLUCAP in the last 168 hours. Lipid Profile: No results for input(s): CHOL, HDL, LDLCALC, TRIG, CHOLHDL, LDLDIRECT in the last 72 hours. Thyroid Function Tests: No results for input(s): TSH, T4TOTAL, FREET4, T3FREE, THYROIDAB in the last 72 hours. Anemia Panel: Recent Labs    08/05/18 0226  FERRITIN 13*  TIBC 451*  IRON 14*   Urine analysis:    Component Value Date/Time   COLORURINE YELLOW 08/05/2018 2014   APPEARANCEUR CLEAR 08/05/2018 2014   LABSPEC 1.012 08/05/2018 2014   PHURINE 7.0 08/05/2018 2014   [place] 08/05/2018 2014   [place] 08/05/2018 2014   BILIRUBINUR NEGATIVE 08/05/2018  2014   [place] 08/05/2018 2014   PROTEINUR NEGATIVE 08/05/2018 2014   NITRITE NEGATIVE 08/05/2018 2014   LEUKOCYTESUR NEGATIVE 08/05/2018 2014   Sepsis Labs: @LABRCNTIP (procalcitonin:4,lacticidven:4)  ) Recent Results (from the past 240 hour(s))  SARS Coronavirus 2 (CEPHEID - Performed in [place] hospital lab), Hosp Order     Status: None   Collection Time: 08/04/18 10:07 PM  Result Value Ref Range Status   SARS Coronavirus 2 NEGATIVE NEGATIVE Final    Comment: (NOTE) If result is NEGATIVE SARS-CoV-2 target nucleic acids are NOT DETECTED. The SARS-CoV-2 RNA is generally detectable in upper and lower  respiratory specimens during the acute phase of infection. The lowest  concentration of SARS-CoV-2 viral copies this assay can detect is 250  copies / mL. A negative result does not preclude  SARS-CoV-2 infection  and should not be used as the sole basis for treatment or other  patient management decisions.  A negative result may occur with  improper specimen collection / handling, submission of specimen other  than nasopharyngeal swab, presence of viral mutation(s) within the  areas targeted by this assay, and inadequate number of viral copies  (<250 copies / mL). A negative result must be combined with clinical  observations, patient history, and epidemiological information. If result is POSITIVE SARS-CoV-2 target nucleic acids are DETECTED. The SARS-CoV-2 RNA is generally detectable in upper and lower  respiratory specimens dur ing the acute phase of infection.  Positive  results are indicative of active infection with SARS-CoV-2.  Clinical  correlation with patient history and other diagnostic information is  necessary to determine patient infection status.  Positive results do  not rule out bacterial infection or co-infection with other viruses. If result is PRESUMPTIVE POSTIVE SARS-CoV-2 nucleic acids MAY BE PRESENT.   A presumptive positive result was obtained on the submitted specimen  and confirmed on repeat testing.  While 2019 novel coronavirus  (SARS-CoV-2) nucleic acids may be present in the submitted sample  additional confirmatory testing may be necessary for epidemiological  and / or clinical management purposes  to differentiate between  SARS-CoV-2 and other Sarbecovirus currently known to infect humans.  If clinically indicated additional testing with an alternate test  methodology [phone]) is advised. The SARS-CoV-2 RNA is generally  detectable in upper and lower respiratory sp ecimens during the acute  phase of infection. The expected result is Negative. Fact Sheet for Patients:  StrictlyIdeas.no Fact Sheet for Healthcare Providers: BankingDealers.co.za This test is not yet approved or cleared by the  [place] FDA and has been authorized for detection and/or diagnosis of SARS-CoV-2 by FDA under an Emergency Use Authorization (EUA).  This EUA will remain in effect (meaning this test can be used) for the duration of the COVID-19 declaration under Section 564(b)(1) of the Act, 21 U.S.C. section 360bbb-3(b)(1), unless the authorization is terminated or revoked sooner. Performed at [facility], [place] [address], [place], [place] [address]       Radiology Studies: No results found.   Scheduled Meds: . [MAR Hold] folic acid  1 mg Oral Daily  . [MAR Hold] multivitamin with minerals  1 tablet Oral Daily  . [MAR Hold] pantoprazole  40 mg Oral BID  . [MAR Hold] thiamine  100 mg Oral Daily   Continuous Infusions: . sodium chloride Stopped (08/07/18 0723)  . lactated ringers       LOS: 2 days   Time Spent in minutes   30 minutes  Aizen Duval D.O. on  08/07/2018 at 7:40 AM  Between 7am to 7pm - Please see pager noted on [email or web address]  After 7pm go to [email or web address]  And look for the night coverage person covering for me after hours  [facility] Office  [phone]

## 2018-08-07 NOTE — Op Note (Signed)
[facility] Patient Name: Barry Moore Procedure Date : 08/07/2018 MRN: [id number] Attending MD: Thornton Park MD, MD Date of Birth: [birth year] CSN: [id number] Age: 53 Admit Type: Inpatient Procedure:                Colonoscopy Indications:              Unexplained iron deficiency anemia. No known family                            history of colon cancer or polyps. This is the                            patient's first colonoscopy. Providers:                Thornton Park MD, MD, Grace Isaac, RN, Marguerita Merles, Technician, Charolette Child, Technician Referring MD:              Medicines:                See the Anesthesia note for documentation of the                            administered medications Complications:            No immediate complications. Estimated blood loss:                            Minimal. Estimated Blood Loss:     Estimated blood loss was minimal. Procedure:                Pre-Anesthesia Assessment:                           - Prior to the procedure, a History and Physical                            was performed, and patient medications and                            allergies were reviewed. The patient's tolerance of                            previous anesthesia was also reviewed. The risks                            and benefits of the procedure and the sedation                            options and risks were discussed with the patient.                            All questions were answered, and informed consent  was obtained. Prior Anticoagulants: The patient has                            taken no previous anticoagulant or antiplatelet                            agents. ASA Grade Assessment: III - A patient with                            severe systemic disease. After reviewing the risks                            and benefits, the patient was deemed in   satisfactory condition to undergo the procedure.                           After obtaining informed consent, the colonoscope                            was passed under direct vision. Throughout the                            procedure, the patient's blood pressure, pulse, and                            oxygen saturations were monitored continuously. The                            CF-HQ190L ([id number]) Olympus colonoscope was                            introduced through the anus and advanced to the the                            terminal ileum, with identification of the                            appendiceal orifice and IC valve. The colonoscopy                            was performed with moderate difficulty due to a                            redundant colon and significant looping. Successful                            completion of the procedure was aided by applying                            abdominal pressure. The patient tolerated the                            procedure well. The quality of the bowel  preparation was good. The terminal ileum, ileocecal                            valve, appendiceal orifice, and rectum were                            photographed. Scope In: 8:16:02 AM Scope Out: 8:35:08 AM Scope Withdrawal Time: 0 hours 14 minutes 6 seconds  Total Procedure Duration: 0 hours 19 minutes 6 seconds  Findings:      Hemorrhoids were found on perianal exam. Small internal and external       hemorrhoids are present.      Two sessile polyps were found in the ascending colon and cecum. The       polyps were 2 to 3 mm in size. These polyps were removed with a cold       snare. Resection and retrieval were complete. Estimated blood loss was       minimal.      A single (solitary), cratered three mm ulcer was found in the proximal       ascending colon. No bleeding was present. No stigmata of recent bleeding       were seen. Biopsies were taken  with the ulcer margins with a cold       forceps for histology. Estimated blood loss was minimal.      The colon (entire examined portion) appeared normal. Biopsies were taken       throughout the colon with a cold forceps for histology.      The entire examined ileum appeared normal.      The exam was otherwise without abnormality on direct and retroflexion       views. Impression:               - Hemorrhoids found on perianal exam.                           - Two 2 to 3 mm polyps in the ascending colon and                            in the cecum, removed with a cold snare. Resected                            and retrieved.                           - A single (solitary) ulcer in the proximal                            ascending colon. This is of unclear clinical                            significance. Biopsied.                           - The entire examined colon is normal. Biopsied                            given the unexplained ascending colon ulcer.                           -  The examined portion of the ileum was normal.                           - The examination was otherwise normal on direct                            and retroflexion views. Recommendation:           - Patient has a contact number available for                            emergencies. The signs and symptoms of potential                            delayed complications were discussed with the                            patient. Return to normal activities tomorrow.                            Written discharge instructions were provided to the                            patient.                           - Advance diet as tolerated today.                           - Continue present medications.                           - Avoid all NSAIDs.                           - Await pathology results of the ulcer.                           - Repeat colonoscopy in 7 years for surveillance if                             one or two polyps is adenomatous.                           - No additional inpatient GI evaluation planned for                            his iron deficiency anemia. Procedure Code(s):        --- Professional ---                           [phone], Colonoscopy, flexible; with removal of                            tumor(s), polyp(s), or other lesion(s) by snare  technique                           45380, 59, Colonoscopy, flexible; with biopsy,                            single or multiple Diagnosis Code(s):        --- Professional ---                           K63.5, Polyp of colon                           K63.3, Ulcer of intestine                           K64.9, Unspecified hemorrhoids                           D50.9, Iron deficiency anemia, unspecified CPT copyright 2019 American Medical Association. All rights reserved. The codes documented in this report are preliminary and upon coder review may  be revised to meet current compliance requirements. Thornton Park MD, MD 08/07/2018 8:48:54 AM This report has been signed electronically. Number of Addenda: 0

## 2018-08-07 NOTE — Interval H&P Note (Signed)
History and Physical Interval Note:  08/07/2018 8:00 AM  Barry Moore  has presented today for surgery, with the diagnosis of iron deficiency anemia.  The various methods of treatment have been discussed with the patient and family. After consideration of risks, benefits and other options for treatment, the patient has consented to  Procedure(s): COLONOSCOPY WITH PROPOFOL (N/A) as a surgical intervention.  The patient's history has been reviewed, patient examined, no change in status, stable for surgery.  I have reviewed the patient's chart and labs.  Questions were answered to the patient's satisfaction.     Thornton Park

## 2018-08-07 NOTE — Transfer of Care (Signed)
Immediate Anesthesia Transfer of Care Note  Patient: Barry Moore  Procedure(s) Performed: COLONOSCOPY WITH PROPOFOL (N/A ) POLYPECTOMY BIOPSY  Patient Location: Endoscopy Unit  Anesthesia Type:MAC  Level of Consciousness: awake, oriented and patient cooperative  Airway & Oxygen Therapy: Patient Spontanous Breathing and Patient connected to face mask oxygen  Post-op Assessment: Report given to RN and Post -op Vital signs reviewed and stable  Post vital signs: Reviewed and stable  Last Vitals:  Vitals Value Taken Time  BP 128/66 08/07/2018  8:42 AM  Temp    Pulse 97 08/07/2018  8:43 AM  Resp 24 08/07/2018  8:43 AM  SpO2 100 % 08/07/2018  8:43 AM  Vitals shown include unvalidated device data.  Last Pain:  Vitals:   08/07/18 0740  TempSrc: Oral  PainSc: 0-No pain      Patients Stated Pain Goal: 0 (13/08/65 7846)  Complications: No apparent anesthesia complications

## 2018-08-08 ENCOUNTER — Encounter: Payer: Self-pay | Admitting: Gastroenterology

## 2018-08-08 DIAGNOSIS — K269 Duodenal ulcer, unspecified as acute or chronic, without hemorrhage or perforation: Secondary | ICD-10-CM

## 2018-08-08 LAB — BASIC METABOLIC PANEL
Anion gap: 6 (ref 5–15)
BUN: <5 mg/dL — ABNORMAL LOW (ref 6–20)
CO2: 21 mmol/L — ABNORMAL LOW (ref 22–32)
Calcium: 8.2 mg/dL — ABNORMAL LOW (ref 8.9–10.3)
Chloride: 105 mmol/L (ref 98–111)
Creatinine, Ser: 0.68 mg/dL (ref 0.61–1.24)
GFR calc Af Amer: >60 mL/min (ref >60)
GFR calc non Af Amer: >60 mL/min (ref >60)
Glucose, Bld: 99 mg/dL (ref 70–99)
Potassium: 3.5 mmol/L (ref 3.5–5.1)
Sodium: 132 mmol/L — ABNORMAL LOW (ref 135–145)

## 2018-08-08 LAB — HEMOGLOBIN AND HEMATOCRIT, BLOOD
HCT: 24.9 % — ABNORMAL LOW (ref 39.0–52.0)
Hemoglobin: 7.6 g/dL — ABNORMAL LOW (ref 13.0–17.0)

## 2018-08-08 MED ORDER — FLUCONAZOLE 100 MG PO TABS
200.0000 mg | ORAL_TABLET | Freq: Every day | ORAL | Status: DC
  Administered 2018-08-09: 10:00:00 200 mg via ORAL
  Filled 2018-08-08: qty 2

## 2018-08-08 MED ORDER — POTASSIUM CHLORIDE CRYS ER 20 MEQ PO TBCR
40.0000 meq | EXTENDED_RELEASE_TABLET | Freq: Once | ORAL | Status: AC
  Administered 2018-08-08: 40 meq via ORAL
  Filled 2018-08-08: qty 2

## 2018-08-08 MED ORDER — FLUCONAZOLE 100 MG PO TABS
400.0000 mg | ORAL_TABLET | Freq: Once | ORAL | Status: AC
  Administered 2018-08-08: 400 mg via ORAL
  Filled 2018-08-08: qty 4

## 2018-08-08 MED ORDER — SODIUM CHLORIDE 0.9 % IV SOLN: 510.0000 mg | Freq: Once | INTRAVENOUS | Status: DC

## 2018-08-08 NOTE — Progress Notes (Addendum)
PROGRESS NOTE    Barry Moore  NFA:[id number] DOB: May 11, 1965 DOA: 08/04/2018 PCP: Patient, No Pcp Per   Brief Narrative:  HPI On 08/05/2018 by Dr. Bernadette Hoit Barry Moore is a 53 y.o. homeless male with medical history significant of alcohol abuse, alcoholic hepatitis, hypertension, anemia and GI bleed who presents to the emergency department via EMS due to sharp abdominal pain in the epigastric area that has been ongoing for several days.  Apparently, it appears that patient complained of chest pain on arrival to the ED but he only complains of sharp abdominal pain in the epigastric area that has been ongoing for about 4 days, he states that he vomited blood about 4 days ago.  Patient admits to about 7- 8 40oz beer daily, with last consumption being shortly prior to arrival at the ED. Lying  still without movement appears to improve the pain, there was no aggravating factor.  He denies fever, chills, shortness of breath, diarrhea, numbness or tingling.  Interim history Admitted with GI bleed.  Status post EGD and Colonoscopy. GI wanting to watch patient and await pathology results given drop in hemoglobin today to 7.6.  Assessment & Plan   GI bleed with abdominal pain nausea and vomiting -He has noticed dark stools recently along with vomiting of blood a few days prior to admission -FOBT positive -Suspect GI bleed secondary to alcohol consumption, ?  Gastritis -Gastroenterology consulted and appreciated -s/p EGD: White nummular lesions in the entire esophagus.  Biopsies taken.  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  LA grade B esophagitis with no bleeding found in the distal esophagus.  Salmon-colored mucosa suspicious for short segment Barrett's esophagus.  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  Multiple dispersed small erosions in the gastric body and antrum.  Biopsies taken.  H. pylori testing.  1 superficial duodenal ulcer clean  ulcer base, duodenal bulb (Forrest class III), 6 mm. -Gastroenterology recommended PPI twice daily.  Discontinue octreotide.  Would need repeat EGD in 3 months.  Colonoscopy. -s/p colonoscopy: Hemorrhoids found on perianal exam. Two 2-3 millimeters polyps in the ascending colon and in cecum, removed.  Single solitary ulcer in the proximal ascending colon biopsied.  Recommended avoid all NSAIDs, repeat colonoscopy in 7 years. -tolerating diet  -Pending biopsy results of EGD and colonoscopy  -Continue to monitor hemoglobin   Addendum: EGD pathology showed candida- started Fluconazole 400mg  today, followed by 200mg  daily for 14 days.  Chronic microcytic anemia/pancytopenia -Likely due to alcohol intake -Hemoglobin currently 7.6 (upon review of patient's chart, baseline hemoglobin approximately 9-10) -Anemia panel: iron def, 14. Ferritin 13.   -Given dose of Feraheme -Will place patient on oral iron supplementation, have discussed side effects -platelets stable -Continue to monitor CBC -given drop in hemoglobin, GI wanting to continue to observe and wait on pathology results  Alcohol dependence with possible withdrawal -Last alcohol consumption was hours prior to admission -Patient states that he drinks "too much" -Alcohol level 196 -Continue CIWA protocol, thiamine, folic acid, multivitamin -Monitor for withdrawal symptoms- patient feels he is starting to withdraw and complains of sweating   Essential hypertension -Not on any home medications, will place on IV hydralazine as needed  Hypokalemia -resolved with replacement, currently 3.5 -will give additional supplementation today -monitor BMP  Hyponatremia -continue IVF, sodium improving 132 -monitor BMP  DVT Prophylaxis  SCDs  Code Status: Full  Family Communication: None at bedside  Disposition Plan: Admitted. Pending GI recommendatins. Suspect  home on discharge  Consultants Gastroenterology  Procedures   EGD Colonoscopy  Antibiotics   Anti-infectives (From admission, onward)   Start     Dose/Rate Route Frequency Ordered Stop   08/04/18 2200  cefTRIAXone (ROCEPHIN) 1 g in sodium chloride 0.9 % 100 mL IVPB     1 g 200 mL/hr over 30 Minutes Intravenous  Once 08/04/18 2154 08/04/18 2317      Subjective:   Barry Moore seen and examined today.  Denies current chest pain, shortness of breath, abdominal pain, nausea vomiting, diarrhea, constipation, dizziness or headache.  Feels tremors are improving. Objective:   Vitals:   08/07/18 1542 08/07/18 2100 08/08/18 0428 08/08/18 0953  BP: 122/72 138/90 137/82 136/82  Pulse:  85 81 86  Resp:  20 18   Temp:  99.1 F (37.3 C) 99.1 F (37.3 C) 98.8 F (37.1 C)  TempSrc:  Oral Oral Oral  SpO2:  100% 99% 99%  Weight:      Height:        Intake/Output Summary (Last 24 hours) at 08/08/2018 1437 Last data filed at 08/08/2018 1420 Gross per 24 hour  Intake 340 ml  Output 1050 ml  Net -710 ml   Filed Weights   08/04/18 1716 08/05/18 0058 08/05/18 1137  Weight: 79.4 kg 70.8 kg 70.8 kg   Exam  General: Well developed, well nourished, NAD, appears stated age  75: NCAT, mucous membranes moist.   Cardiovascular: S1 S2 auscultated, no murmurs, RRR  Respiratory: Clear to auscultation bilaterally with equal chest rise  Abdomen: Soft, nontender, nondistended, + bowel sounds  Extremities: warm dry without cyanosis clubbing or edema  Neuro: AAOx3, nonfocal, tremors improving   Skin: Without rashes exudates or nodules  Psych: Normal affect and demeanor, pleasant    Data Reviewed: I have personally reviewed following labs and imaging studies  CBC: Recent Labs  Lab 08/04/18 2042 08/05/18 0944 08/06/18 0336 08/07/18 0244 08/08/18 0330  WBC 3.4* 2.8* 3.4* 4.0  --   NEUTROABS 1.4* 1.4*  --   --   --   HGB 9.7* 8.1* 8.8* 8.2* 7.6*  HCT 30.4* 25.7* 27.8* 26.3* 24.9*  MCV 77.2* 78.1* 77.0* 79.2*  --   PLT 80* 83* 76* 83*  --     Basic Metabolic Panel: Recent Labs  Lab 08/05/18 0944 08/06/18 0336 08/06/18 2010 08/07/18 0244 08/08/18 0330  NA 134* 126* 127* 131* 132*  K 3.7 3.5 3.1* 3.6 3.5  CL 100 94* 95* 104 105  CO2 20* 24 23 19* 21*  GLUCOSE 107* 114* 83 96 99  BUN <5* <5* <5* <5* <5*  CREATININE 0.71 0.67 0.74 0.70 0.68  CALCIUM 7.7* 8.3* 8.5* 8.3* 8.2*   GFR: Estimated Creatinine Clearance: 103.3 mL/min (by C-G formula based on SCr of 0.68 mg/dL). Liver Function Tests: Recent Labs  Lab 08/04/18 2042 08/05/18 0944  AST 139* 139*  ALT 32 31  ALKPHOS 137* 120  BILITOT 1.1 1.3*  PROT 7.9 6.9  ALBUMIN 3.3* 3.0*   Recent Labs  Lab 08/04/18 2042  LIPASE 48   No results for input(s): AMMONIA in the last 168 hours. Coagulation Profile: Recent Labs  Lab 08/04/18 2343  INR 1.2   Cardiac Enzymes: Recent Labs  Lab 08/04/18 2042  TROPONINI <0.03   BNP (last 3 results) No results for input(s): PROBNP in the last 8760 hours. HbA1C: No results for input(s): HGBA1C in the last 72 hours. CBG: No results for input(s): GLUCAP in the last 168 hours.  Lipid Profile: No results for input(s): CHOL, HDL, LDLCALC, TRIG, CHOLHDL, LDLDIRECT in the last 72 hours. Thyroid Function Tests: No results for input(s): TSH, T4TOTAL, FREET4, T3FREE, THYROIDAB in the last 72 hours. Anemia Panel: No results for input(s): VITAMINB12, FOLATE, FERRITIN, TIBC, IRON, RETICCTPCT in the last 72 hours. Urine analysis:    Component Value Date/Time   COLORURINE YELLOW 08/05/2018 2014   APPEARANCEUR CLEAR 08/05/2018 2014   LABSPEC 1.012 08/05/2018 2014   PHURINE 7.0 08/05/2018 2014   GLUCOSEU NEGATIVE 08/05/2018 2014   HGBUR NEGATIVE 08/05/2018 2014   [place] NEGATIVE 08/05/2018 2014   [place] 08/05/2018 2014   PROTEINUR NEGATIVE 08/05/2018 2014   NITRITE NEGATIVE 08/05/2018 2014   LEUKOCYTESUR NEGATIVE 08/05/2018 2014   Sepsis Labs: @LABRCNTIP (procalcitonin:4,lacticidven:4)  ) Recent  Results (from the past 240 hour(s))  SARS Coronavirus 2 (CEPHEID - Performed in [place] hospital lab), Hosp Order     Status: None   Collection Time: 08/04/18 10:07 PM  Result Value Ref Range Status   SARS Coronavirus 2 NEGATIVE NEGATIVE Final    Comment: (NOTE) If result is NEGATIVE SARS-CoV-2 target nucleic acids are NOT DETECTED. The SARS-CoV-2 RNA is generally detectable in upper and lower  respiratory specimens during the acute phase of infection. The lowest  concentration of SARS-CoV-2 viral copies this assay can detect is 250  copies / mL. A negative result does not preclude SARS-CoV-2 infection  and should not be used as the sole basis for treatment or other  patient management decisions.  A negative result may occur with  improper specimen collection / handling, submission of specimen other  than nasopharyngeal swab, presence of viral mutation(s) within the  areas targeted by this assay, and inadequate number of viral copies  (<250 copies / mL). A negative result must be combined with clinical  observations, patient history, and epidemiological information. If result is POSITIVE SARS-CoV-2 target nucleic acids are DETECTED. The SARS-CoV-2 RNA is generally detectable in upper and lower  respiratory specimens dur ing the acute phase of infection.  Positive  results are indicative of active infection with SARS-CoV-2.  Clinical  correlation with patient history and other diagnostic information is  necessary to determine patient infection status.  Positive results do  not rule out bacterial infection or co-infection with other viruses. If result is PRESUMPTIVE POSTIVE SARS-CoV-2 nucleic acids MAY BE PRESENT.   A presumptive positive result was obtained on the submitted specimen  and confirmed on repeat testing.  While 2019 novel coronavirus  (SARS-CoV-2) nucleic acids may be present in the submitted sample  additional confirmatory testing may be necessary for epidemiological   and / or clinical management purposes  to differentiate between  SARS-CoV-2 and other Sarbecovirus currently known to infect humans.  If clinically indicated additional testing with an alternate test  methodology [phone]) is advised. The SARS-CoV-2 RNA is generally  detectable in upper and lower respiratory sp ecimens during the acute  phase of infection. The expected result is Negative. Fact Sheet for Patients:  StrictlyIdeas.no Fact Sheet for Healthcare Providers: BankingDealers.co.za This test is not yet approved or cleared by the [place] FDA and has been authorized for detection and/or diagnosis of SARS-CoV-2 by FDA under an Emergency Use Authorization (EUA).  This EUA will remain in effect (meaning this test can be used) for the duration of the COVID-19 declaration under Section 564(b)(1) of the Act, 21 U.S.C. section 360bbb-3(b)(1), unless the authorization is terminated or revoked sooner. Performed at [facility], [place]  [address], [place], [place] [address]       Radiology Studies: No results found.   Scheduled Meds:  folic acid  1 mg Oral Daily   multivitamin with minerals  1 tablet Oral Daily   pantoprazole  40 mg Oral BID   thiamine  100 mg Oral Daily   Continuous Infusions:  sodium chloride 75 mL/hr at 08/08/18 0312     LOS: 3 days   Time Spent in minutes   30 minutes  Devun Anna D.O. on 08/08/2018 at 2:37 PM  Between 7am to 7pm - Please see pager noted on [email or web address]  After 7pm go to [email or web address]  And look for the night coverage person covering for me after hours  [facility] Office  [phone]

## 2018-08-08 NOTE — TOC Initial Note (Addendum)
Transition of Care [facility]) - Initial/Assessment Note    Patient Details  Name: Barry Moore MRN: [id number] Date of Birth: [birth year]  [facility]) CM/SW Contact:    Vinie Sill, [place] Phone Number: 08/08/2018, 11:09 AM  Clinical Narrative:                 CSW consulted for medication assistance and transportation. Readmission risk assessment completed. CSW advised RNCM  regarding medication assistance. Patient states he lives in an apartment alone. Patient states he needs transportation to get home. Since Covid-19, [place] waived their bus fare- CSW provided patient with bus pass but advised to inquire about fare before using his bus pass.  CSW offered food and pantry resources, including alcohol and substance abuse resources. patient declined- patient states no community resources needed at this time. Patient states he will go to the [facility] if needed, which is not far from where he stays. Patient expressed appreciation for CSW assistance and states no further questions or concerns.  Clinical Social Worker will sign off for now as social work intervention is no longer needed. Please consult [name] if new need arises.    Thurmond Butts, [facility] Clinical Social Worker [phone]    Expected Discharge Plan: Home/Self Care Barriers to Discharge: No Barriers Identified   Patient Goals and CMS Choice Patient states their goals for this hospitalization and ongoing recovery are:: to go home       Expected Discharge Plan and Services Expected Discharge Plan: Home/Self Care In-house Referral: Clinical Social Work     Living arrangements for the past 2 months: Apartment                                      Prior Living Arrangements/Services Living arrangements for the past 2 months: Apartment Lives with:: Self Patient language and need for interpreter reviewed:: No Do you feel safe going back to the place where you live?: Yes            Criminal  Activity/Legal Involvement Pertinent to Current Situation/Hospitalization: Yes - Comment as needed  Activities of Daily Living Home Assistive Devices/Equipment: None ADL Screening (condition at time of admission) Patient's cognitive ability adequate to safely complete daily activities?: Yes Is the patient deaf or have difficulty hearing?: No Does the patient have difficulty seeing, even when wearing glasses/contacts?: No Does the patient have difficulty concentrating, remembering, or making decisions?: No Patient able to express need for assistance with ADLs?: Yes Does the patient have difficulty dressing or bathing?: No Independently performs ADLs?: Yes (appropriate for developmental age) Does the patient have difficulty walking or climbing stairs?: No Weakness of Legs: None Weakness of Arms/Hands: None  Permission Sought/Granted   Permission granted to share information with : No              Emotional Assessment Appearance:: Appears stated age Attitude/Demeanor/Rapport: Engaged Affect (typically observed): Appropriate Orientation: : Oriented to Self, Oriented to Place, Oriented to  Time, Oriented to Situation Alcohol / Substance Use: Not Applicable Psych Involvement: No (comment)  Admission diagnosis:  Thrombocytopenia ([place]) [D69.6] Gastrointestinal hemorrhage, unspecified gastrointestinal hemorrhage type [T61.4] Alcoholic hepatitis, unspecified whether ascites present [K70.10] Patient Active Problem List   Diagnosis Date Noted  . Colon ulcer   . Polyp of ascending colon   . Acute GI bleeding 08/05/2018  . Transaminitis 08/05/2018  . Abdominal pain 08/05/2018  . Nausea and vomiting 08/05/2018  .  Substance induced mood disorder ([place]) 07/16/2017  . Severe recurrent major depression without psychotic features ([place]) 05/15/2017  . Anemia 03/18/2017  . Acute blood loss anemia 02/21/2017  . UGI bleed 02/08/2017  . Alcohol withdrawal ([place]) 02/08/2017  . Cocaine abuse ([place])  11/18/2016  . GI bleed 11/01/2016  . Alcohol abuse   . Major depressive disorder, recurrent severe without psychotic features ([place]) 07/06/2016  . 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 Seizure ([place]) 05/26/2016  . Tobacco abuse 05/26/2016  . Homeless 05/26/2016  . Essential hypertension 05/26/2016  . Alcohol dependence with alcohol-induced mood disorder ([place]) 02/14/2016  . Severe alcohol withdrawal without perceptual disturbances without complication ([place]) 87/19/5974  . Alcoholic hepatitis without ascites 02/14/2016  . Thrombocytopenia ([place]) 02/14/2016   PCP:  Patient, No Pcp Per Pharmacy:   [place], [place] - [address] [place] [address] Phone: [phone] Fax: [phone]     Social Determinants of Health (SDOH) Interventions    Readmission Risk Interventions No flowsheet data found.

## 2018-08-08 NOTE — Discharge Instructions (Signed)
Gastrointestinal Bleeding ° °Gastrointestinal bleeding is bleeding somewhere along the path food travels through the body (digestive tract). This path is anywhere between the mouth and the opening of the butt (anus). You may have blood in your poop (stools) or have black poop. If you throw up (vomit), there may be blood in it. °This condition can be mild, serious, or even life-threatening. If you have a lot of bleeding, you may need to stay in the hospital. °Follow these instructions at home: °· Take over-the-counter and prescription medicines only as told by your doctor. °· Eat foods that have a lot of fiber in them. These foods include whole grains, fruits, and vegetables. You can also try eating 1-3 prunes each day. °· Drink enough fluid to keep your pee (urine) clear or pale yellow. °· Keep all follow-up visits as told by your doctor. This is important. °Contact a doctor if: °· Your symptoms do not get better. °Get help right away if: °· Your bleeding gets worse. °· You feel dizzy or you pass out (faint). °· You feel weak. °· You have very bad cramps in your back or belly (abdomen). °· You pass large clumps of blood (clots) in your poop. °· Your symptoms are getting worse. °This information is not intended to replace advice given to you by your health care provider. Make sure you discuss any questions you have with your health care provider. °Document Released: 12/16/2007 Document Revised: 08/14/2015 Document Reviewed: 08/26/2014 °Elsevier Interactive Patient Education © 2019 Elsevier Inc. ° °

## 2018-08-08 NOTE — Progress Notes (Signed)
Daily Rounding Note  08/08/2018, 10:29 AM  LOS: 3 days   SUBJECTIVE:   Chief complaint: Anemia, GI bleed.  Colon polyps.  Small esophageal varices.  Esophagitis, rule out Barrett's. Ate all of his breakfast.  No nausea.  Epigastric pain resolved. Walking from his bed to the bathroom without difficulty. Tremors improved.  OBJECTIVE:         Vital signs in last 24 hours:    Temp:  [98.1 F (36.7 C)-99.1 F (37.3 C)] 98.8 F (37.1 C) (05/19 0953) Pulse Rate:  [81-98] 86 (05/19 0953) Resp:  [18-20] 18 (05/19 0428) BP: (108-138)/(68-90) 136/82 (05/19 0953) SpO2:  [99 %-100 %] 99 % (05/19 0953) Last BM Date: 08/07/18 Filed Weights   08/04/18 1716 08/05/18 0058 08/05/18 1137  Weight: 79.4 kg 70.8 kg 70.8 kg   General: Looks lathered/weathered but not ill Heart: RRR Chest: No labored breathing or cough.  Lungs clear bilaterally Abdomen: Soft.  Not tender or distended.  No masses, HSM, bruits, hernias. Extremities: 3+ DP pulses bilaterally.  No edema. Neuro/Psych: Oriented x3.  Fully alert.  Slight tremor in the hands, no asterixis.  Intake/Output from previous day: 05/18 0701 - 05/19 0700 In: 1000 [P.O.:600; I.V.:400] Out: 250 [Urine:250]  Intake/Output this shift: Total I/O In: 160 [P.O.:160] Out: -   Lab Results: Recent Labs    08/06/18 0336 08/07/18 0244 08/08/18 0330  WBC 3.4* 4.0  --   HGB 8.8* 8.2* 7.6*  HCT 27.8* 26.3* 24.9*  PLT 76* 83*  --    BMET Recent Labs    08/06/18 2010 08/07/18 0244 08/08/18 0330  NA 127* 131* 132*  K 3.1* 3.6 3.5  CL 95* 104 105  CO2 23 19* 21*  GLUCOSE 83 96 99  BUN <5* <5* <5*  CREATININE 0.74 0.70 0.68  CALCIUM 8.5* 8.3* 8.2*   Scheduled Meds: . folic acid  1 mg Oral Daily  . multivitamin with minerals  1 tablet Oral Daily  . pantoprazole  40 mg Oral BID  . thiamine  100 mg Oral Daily   Continuous Infusions: . sodium chloride 75 mL/hr at 08/08/18 0312    PRN Meds:.acetaminophen, hydrALAZINE, LORazepam, ondansetron (ZOFRAN) IV   ASSESMENT:   *   Acute on chronic microcytic anemia dating to least 02/2017.  IDA, low iron and ferritin 13..  CGE, hematemesis.  Melena. Previous hx PUD.  Portal hypertensive gastropathy, nonbleeding duodenal ulcer, duodenitis.  3 sonometer hiatal hernia.  Nonobstructing mild Schatzki ring on EGD 10/2016 08/05/2018 EGD.  Nummular, white esophageal lesions.  Grade 1 and small esophageal varices.  Grade B esophagitis.  Salmon-colored mucosa suspicious for short segment Barrett's esophagus.  Portal hypertensive gastropathy.  Erosive gastropathy, biopsied.  Clean-based duodenal ulcer. 08/07/2018 colonoscopy. Two, 2 to 3 mm, sessile polyps in the ascending colon, cecum.  Cold snare removed.  Cratered, 3 mm, solitary ulcer in proximal ascending colon, this was not bleeding.  Biopsies obtained.  Hemorrhoids. Hgb 9.7 >> 7.6 over the course of 4 days admission.  *   Alcoholic hepatitis.  Hepatic steatosis, gallbladder wall edema, normal portal venous blood flow per ultrasound 05/24/2018.  No evidence of portal vein thrombosis or liver disease on CT abdomen pelvis with contrast on 05/24/2018.   Normal PT/INR. Suspect cirrhosis of the liver.  *   Acute on chronic epigastric pain.  *    Alcoholism.  Polysubstance abuse.  Elevated EtOH level on admission.  Above protocol in place.  *  Thrombocytopenia dating to 05/2017.  *    Hyponatremia.  Proved.  *    Deviously homeless but in the last few weeks obtained apartment through [employer].   PLAN   *    Awaiting pathology report from the EGD and colon specimens  *    Continue twice daily oral Protonix 40 mg  *    ? stop IV fluids? taking adequate PO, renal function not compromised.  However he remains somewhat hyponatremic.   Azucena Freed  08/08/2018, 10:29 AM Phone [phone]

## 2018-08-08 NOTE — Progress Notes (Signed)
Initial Nutrition Assessment  RD working remotely.  DOCUMENTATION CODES:   Not applicable  INTERVENTION:   -Continue MVI with minerals daily -Ensure Enlive po BID, each supplement provides 350 kcal and 20 grams of protein  NUTRITION DIAGNOSIS:   Increased nutrient needs related to chronic illness(alcoholic hepatitis) as evidenced by estimated needs.  GOAL:   Patient will meet greater than or equal to 90% of their needs  MONITOR:   PO intake, Supplement acceptance, Weight trends  REASON FOR ASSESSMENT:   Malnutrition Screening Tool    ASSESSMENT:   Barry Moore is a 53 y.o. homeless male with medical history significant of alcohol abuse, alcoholic hepatitis, hypertension, anemia and GI bleed who presents to the emergency department via EMS due to sharp abdominal pain in the epigastric area that has been ongoing for several days.  Apparently, it appears that patient complained of chest pain on arrival to the ED but he only complains of sharp abdominal pain in the epigastric area that has been ongoing for about 4 days, he states that he vomited blood about 4 days ago.  Patient admits to about 7- 8 40oz beer daily, with last consumption being shortly prior to arrival at the ED. Lying  still without movement appears to improve the pain, there was no aggravating factor.  He denies fever, chills, shortness of breath, diarrhea, numbness or tingling.  Pt admitted with acute GIB.   5/16- s/p EGD- revealed nummular, white esophageal lesions.  Grade 1 and small esophageal varices.  Grade B esophagitis.  Salmon-colored mucosa suspicious for short segment Barrett's esophagus.  Portal hypertensive gastropathy.  Erosive gastropathy, biopsied.  Clean-based duodenal ulcer. 5/18- s/p colonoscopy- revealed Two, 2 to 3 mm, sessile polyps in the ascending colon, cecum.  Cold snare removed.  Cratered, 3 mm, solitary ulcer in proximal ascending colon, this was not bleeding.  Biopsies obtained.   Hemorrhoids.  Reviewed I/O's: +750 ml x 24 hours and +4.6 L since admission  UOP: 250 ml x 24 hours  Per GI notes, awaiting pathology reports from EGD and colonoscopy.  Called pt x 2 in attempt to obtain further nutrition-related history, however, unable to reach pt at this time.  Pt currently with good appetite. Noted meal completion 50-100%.  Reviewed wt hx; noted pt has experienced a 9.8% wt loss over the past year. While this is not significant, this is concerning given history of ETOH abuse. Of note, pt has a history of homelessness, but recently obtained an apartment through the housing authority.   Pt at risk for malnutrition, however, RD unable to identify at this time.   Labs reviewed: Na: 131.   NUTRITION - FOCUSED PHYSICAL EXAM:    Most Recent Value  Orbital Region  Unable to assess  Upper Arm Region  Unable to assess  Thoracic and Lumbar Region  Unable to assess  Buccal Region  Unable to assess  Temple Region  Unable to assess  Clavicle Bone Region  Unable to assess  Clavicle and Acromion Bone Region  Unable to assess  Scapular Bone Region  Unable to assess  Dorsal Hand  Unable to assess  Patellar Region  Unable to assess  Anterior Thigh Region  Unable to assess  Posterior Calf Region  Unable to assess  Edema (RD Assessment)  Unable to assess  Hair  Unable to assess  Eyes  Unable to assess  Mouth  Unable to assess  Skin  Unable to assess  Nails  Unable to assess  Diet Order:   Diet Order            Diet regular Room service appropriate? Yes; Fluid consistency: Thin  Diet effective now              EDUCATION NEEDS:   No education needs have been identified at this time  Skin:  Skin Assessment: Reviewed RN Assessment  Last BM:  08/07/18  Height:   Ht Readings from Last 1 Encounters:  08/05/18 5\' 8"  (1.727 m)    Weight:   Wt Readings from Last 1 Encounters:  08/05/18 70.8 kg    Ideal Body Weight:  70 kg  BMI:  Body mass index is  23.73 kg/m.  Estimated Nutritional Needs:   Kcal:  2100-2300  Protein:  105-120 grams  Fluid:  2.1-2.3 L    Vondell Babers A. Jimmye Norman, RD, LDN, [place] Registered Dietitian II Certified Diabetes Care and Education Specialist Pager: [phone] After hours Pager: [phone]

## 2018-08-09 ENCOUNTER — Encounter: Payer: Self-pay | Admitting: Gastroenterology

## 2018-08-09 ENCOUNTER — Encounter (HOSPITAL_COMMUNITY): Payer: Self-pay | Admitting: Gastroenterology

## 2018-08-09 LAB — BASIC METABOLIC PANEL
Anion gap: 9 (ref 5–15)
BUN: <5 mg/dL — ABNORMAL LOW (ref 6–20)
CO2: 23 mmol/L (ref 22–32)
Calcium: 8.6 mg/dL — ABNORMAL LOW (ref 8.9–10.3)
Chloride: 101 mmol/L (ref 98–111)
Creatinine, Ser: 0.66 mg/dL (ref 0.61–1.24)
GFR calc Af Amer: >60 mL/min (ref >60)
GFR calc non Af Amer: >60 mL/min (ref >60)
Glucose, Bld: 94 mg/dL (ref 70–99)
Potassium: 3.7 mmol/L (ref 3.5–5.1)
Sodium: 133 mmol/L — ABNORMAL LOW (ref 135–145)

## 2018-08-09 LAB — CBC
HCT: 24.3 % — ABNORMAL LOW (ref 39.0–52.0)
Hemoglobin: 7.4 g/dL — ABNORMAL LOW (ref 13.0–17.0)
MCH: 24.4 pg — ABNORMAL LOW (ref 26.0–34.0)
MCHC: 30.5 g/dL (ref 30.0–36.0)
MCV: 80.2 fL (ref 80.0–100.0)
Platelets: 92 10*3/uL — ABNORMAL LOW (ref 150–400)
RBC: 3.03 MIL/uL — ABNORMAL LOW (ref 4.22–5.81)
RDW: 18 % — ABNORMAL HIGH (ref 11.5–15.5)
WBC: 3.7 10*3/uL — ABNORMAL LOW (ref 4.0–10.5)
nRBC: 0 % (ref 0.0–0.2)

## 2018-08-09 MED ORDER — FERROUS SULFATE 325 (65 FE) MG PO TABS: 325.0000 mg | ORAL_TABLET | Freq: Every day | ORAL | Status: DC

## 2018-08-09 MED ORDER — FERROUS SULFATE 325 (65 FE) MG PO TABS: 325.0000 mg | ORAL_TABLET | Freq: Every day | ORAL | 11 refills | Status: DC

## 2018-08-09 MED ORDER — FOLIC ACID 1 MG PO TABS: 1.0000 mg | ORAL_TABLET | Freq: Every day | ORAL | 0 refills | Status: DC

## 2018-08-09 MED ORDER — ADULT MULTIVITAMIN W/MINERALS CH: 1.0000 | ORAL_TABLET | Freq: Every day | ORAL | 0 refills | Status: DC

## 2018-08-09 MED ORDER — THIAMINE HCL 100 MG PO TABS: 100.0000 mg | ORAL_TABLET | Freq: Every day | ORAL | 0 refills | Status: DC

## 2018-08-09 MED ORDER — FLUCONAZOLE 200 MG PO TABS: 200.0000 mg | ORAL_TABLET | Freq: Every day | ORAL | 0 refills | Status: AC

## 2018-08-09 MED ORDER — PANTOPRAZOLE SODIUM 40 MG PO TBEC: 40.0000 mg | DELAYED_RELEASE_TABLET | Freq: Two times a day (BID) | ORAL | 0 refills | Status: DC

## 2018-08-09 MED FILL — FERROUS SULFATE 325 MG TAB: 325 (65 FE) | 30 days supply | Qty: 30 | Fill #0

## 2018-08-09 MED FILL — FOLIC ACID 1 MG TABS: 1 | 30 days supply | Qty: 30 | Fill #0

## 2018-08-09 MED FILL — THIAMINE HCL 100 MG TABS: 100 | 30 days supply | Qty: 30 | Fill #0

## 2018-08-09 MED FILL — FLUCONAZOLE 200 MG TABLET: 200 | 12 days supply | Qty: 12 | Fill #0

## 2018-08-09 MED FILL — PANTOPRAZOLE SOD DR 40 MG T: 40 | 30 days supply | Qty: 60 | Fill #0

## 2018-08-09 NOTE — TOC Transition Note (Signed)
Transition of Care [facility]) - CM/SW Discharge Note Marvetta Gibbons RN, BSN Transitions of Care Unit 4E- RN Case Manager [phone]   Patient Details  Name: Barry Moore MRN: [id number] Date of Birth: 1965/04/16  [facility] CM/SW Contact:  Dawayne Patricia, RN Phone Number: 08/09/2018, 2:42 PM   Clinical Narrative:    Pt stable for transition home today, CM spoke with pt regarding transition needs, appointment has been made for f/u with [facility]. Pt concerned about med cost. Scripts have been sent to [facility]- pt last used [facility] assistance September 29 2017. CM will re-instate pt for [facility] program for medication assistance and waive copay cost as pt reports he is not sure he can cover the copay of $3 per med. [place] [facility] will fill meds and deliver to bedside prior to discharge. Pt states he will take bus and has apartment to return home to.    Final next level of care: Home/Self Care Barriers to Discharge: No Barriers Identified   Patient Goals and CMS Choice Patient states their goals for this hospitalization and ongoing recovery are:: to go home    Choice offered to / list presented to : NA  Discharge Placement  Home                     Discharge Plan and Services In-house Referral: Clinical Social Work Discharge Planning Services: [place] [facility], Medication Assistance Post Acute Care Choice: NA          DME Arranged: N/A DME Agency: NA       HH Arranged: NA HH Agency: NA        Social Determinants of Health (SDOH) Interventions     Readmission Risk Interventions Readmission Risk Prevention Plan 08/09/2018  Transportation Screening Complete  PCP or Specialist Appt within 5-7 Days Complete  Home Care Screening Complete  Medication Review (RN CM) Complete  Some recent data might be hidden

## 2018-08-09 NOTE — Progress Notes (Signed)
Pt doing well, no abd pain, eating well.  Stools are brown.   Vital signs stable Hospitalist planning discharge today Hgb 7.4.   Platelets 92.   Problems  *   Acute on chronic IDA, microcytosis. Small esophageal varices, mild esophagitis, question Barrett's esophagus, portal hypertensive gastropathy, erosive gastropathy, clean-based duodenal ulcer at EGD. Small colon polyps, solitary ascending colon ulcer, hemorrhoids on colonoscopy.  *    Alcoholic hepatitis.  Hepatic steatosis, GB wall edema, normal portal venous blood flow per ultrasound.  No liver disease or portal vein thrombosis per CT. Suspect cirrhosis of liver. Normal PT/INR.   *    Chronic alcoholism, substance abuse.  *    Thrombocytopenia.  *   Hyponatremia, improved.    Plan  *   He has an appointment for follow-up with [facility] on 5/[facility].  He ought to undergo repeat CBC at that point to make sure his Hgb is trending up.  *  Protonix 40 mg BID. Fe SO4 325 mg daily (just added). Low sodium diet, 2 gm daily if he can stick with this.    *    Virtual visit with Dr. Rush Landmark 6/19   *   Complete and total alcohol abstinence

## 2018-08-09 NOTE — Discharge Summary (Signed)
Physician Discharge Summary  Barry Moore ZSW:[id number] DOB: [birth year] DOA: 08/04/2018  PCP: Barry Moore, No Pcp Per  Admit date: 08/04/2018 Discharge date: 08/09/2018  Time spent: 40 minutes  Recommendations for Outpatient Follow-up:  1. Follow up outpatient CBC/CMP 2. Ensure follow up with gastroenterology outpatient.  Follow up biopsy results.  3. Follow up EGD in about 3 months.  Needs repeat colonoscopy as well in 7 years. 4. Consider CTA if pt has abdominal pain given isolated ischemic colon ulcer  Discharge Diagnoses:  Principal Problem:   Acute GI bleeding Active Problems:   Alcohol dependence with alcohol-induced mood disorder (HCC)   Alcoholic hepatitis without ascites   Thrombocytopenia (HCC)   Tobacco abuse   Essential hypertension   Alcohol withdrawal (HCC)   Anemia   Transaminitis   Abdominal pain   Nausea and vomiting   Colon ulcer   Polyp of ascending colon   Discharge Condition: stable  Diet recommendation: heart healthy  Filed Weights   08/04/18 1716 08/05/18 0058 08/05/18 1137  Weight: 79.4 kg 70.8 kg 70.8 kg    History of present illness:  HPI On 08/05/2018 by Dr. Wiliam Ke Elmoreis a 53 y.o.homeless malewith medical history significant ofalcohol abuse, alcoholic hepatitis, hypertension, anemia and GI bleed who presents to the emergency department via EMS due to sharp abdominal pain in the epigastric area that has been ongoing for several days. Apparently, it appears that Barry Moore complained of chest pain on arrival to the ED but he only complainsof sharp abdominal pain in the epigastric area that has been ongoing for about 4days, he states that he vomited blood about 4days ago. Barry Moore admits to about 7- 8 40oz beer daily,with last consumption being shortly prior to arrival at the ED.Lying still without movement appears to improve the pain, there was no aggravating factor. He denies fever, chills, shortness of breath, diarrhea,  numbness or tingling.  Interim history Admitted with GI bleed.  Status post EGD and Colonoscopy. Pt stable today with stable hb.  Plan for d/c with outpatient GI follow up.    Hospital Course:  GI bleed with abdominal pain nausea and vomiting -He has noticed dark stools recently along with vomiting of blood a few days prior to admission -FOBT positive -Suspect GI bleed secondary to alcohol consumption, ?  Gastritis -Gastroenterology consulted and appreciated -s/p EGD: White nummular lesions in the entire esophagus.  Biopsies taken.  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  LA grade B esophagitis with no bleeding found in the distal esophagus.  Salmon-colored mucosa suspicious for short segment Barrett's esophagus.  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  Multiple dispersed small erosions in the gastric body and antrum.  Biopsies taken.  H. pylori testing.  1 superficial duodenal ulcer clean ulcer base, duodenal bulb (Forrest class III), 6 mm. -Gastroenterology recommended PPI twice daily.  Discontinue octreotide.  Would need repeat EGD in 3 months.  Colonoscopy. -s/p colonoscopy:Hemorrhoids found on perianal exam.Two 2-71mllimeters polyps in the ascending colon and in cecum, removed. Single solitary ulcer in the proximal ascending colon biopsied. Recommended avoid all NSAIDs, repeat colonoscopy in 7 years. -tolerating diet -Pending biopsy results of EGD and colonoscopy - notable for peptic duodenitis, mild reactive gastropathy, candida esophagitis.  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  -Continue to monitor hemoglobin - stable at d/c  - started on fluconazole for candida esophagitis  Chronic microcytic anemia/pancytopenia -  Likely due to alcohol intake -Hemoglobin currently 7.4, stable at discahrge -Anemia panel: iron def, 14. Ferritin 13.    -Given dose of Feraheme -Will place Barry Moore on oral iron supplementation, have discussed side effects -platelets stable -Continue to monitor CBC  Alcohol dependence with possible withdrawal -Last alcohol consumption was hours prior to admission -Barry Moore states that he drinks "too much" -Alcohol level 196 -Continue CIWA protocol, thiamine, folic acid, multivitamin -Monitor for withdrawal symptoms- Barry Moore feels he is starting to withdraw and complains of sweating  - encourage cessation   Essential hypertension -Not on any home medications, will place on IV hydralazine as needed  Hypokalemia -resolved  -monitor BMP  Hyponatremia -continue IVF, sodium improving  -monitor BMP  Procedures: EGD - White nummular lesions in esophageal mucosa. Biopsied. - Grade I and small (< 5 mm) esophageal varices. - LA Grade B esophagitis. - Salmon-colored mucosa suspicious for short-segment Barrett's esophagus. - Portal hypertensive gastropathy. - Erosive gastropathy. Biopsied. - Duodenal ulcer with a clean ulcer base (Forrest Class III). - Nodular mucosa in the duodenal bulb. Biopsied. - No gross lesions in the second portion of the duodenum. Impression: - The Barry Moore will be observed post-procedure, until all discharge criteria are met. - Return Barry Moore to hospital ward for ongoing care. - Barry Moore has a contact number available for emergencies. The signs and symptoms of potential delayed complications were discussed with the Barry Moore. Return to normal activities tomorrow. Written discharge instructions were provided to the Barry Moore. - Resume previous diet. - Transition to PO PPI BID. Stop Octreotide. - Barry Moore will need a colonoscopy for workup of Iron deficiency. Will discuss due to his homelessness whether he would want to do here or do in the future. Current presentation of anemia and hematemesis is most likely associated with his findings on EGD today. Needs repeat EGD In 34-month  to re-evaluate the regions and esophagitis and consider biopsies to rule out Barrett's (though would have to be mindful since the changes are on varices). - The findings and recommendations were discussed with the Barry Moore. - The findings and recommendations were discussed with the Hospital Team.  Colonoscopy - Hemorrhoids found on perianal exam. - Two 2 to 3 mm polyps in the ascending colon and in the cecum, removed with a cold snare. Resected and retrieved. - A single (solitary) ulcer in the proximal ascending colon. This is of unclear clinical significance. Biopsied. - The entire examined colon is normal. Biopsied given the unexplained ascending colon ulcer. - The examined portion of the ileum was normal. - The examination was otherwise normal on direct and retroflexion views. Impression: - Barry Moore has a contact number available for emergencies. The signs and symptoms of potential delayed complications were discussed with the Barry Moore. Return to normal activities tomorrow. Written discharge instructions were provided to the Barry Moore. - Advance diet as tolerated today. - Continue present medications. - Avoid all NSAIDs. - Await pathology results of the ulcer. - Repeat colonoscopy in 7 years for surveillance if one or two polyps is adenomatous. - No additional inpatient GI evaluation planned for his iron deficiency anemia.  Consultations:  GI  Discharge Exam: Vitals:   08/09/18 0947 08/09/18 0950  BP:  119/78  Pulse: 80 82  Resp:    Temp:    SpO2:  100%   Feels ready for d/c today  General: No acute distress. Cardiovascular: Heart sounds show a regular rate, and rhythm. Lungs: Clear to auscultation bilaterally  Abdomen: Soft, nontender, nondistended Neurological: Alert and oriented 3. Moves all  extremities 4 . Cranial nerves II through XII grossly intact. Skin: Warm and dry. No rashes or lesions. Extremities: No clubbing or cyanosis. No edema.  Psychiatric: Mood and  affect are normal. Insight and judgment are appropriate.  Discharge Instructions   Discharge Instructions    Call MD for:  difficulty breathing, headache or visual disturbances   Complete by:  As directed    Call MD for:  extreme fatigue   Complete by:  As directed    Call MD for:  hives   Complete by:  As directed    Call MD for:  persistant dizziness or light-headedness   Complete by:  As directed    Call MD for:  persistant nausea and vomiting   Complete by:  As directed    Call MD for:  redness, tenderness, or signs of infection (pain, swelling, redness, odor or green/yellow discharge around incision site)   Complete by:  As directed    Call MD for:  severe uncontrolled pain   Complete by:  As directed    Call MD for:  temperature >100.4   Complete by:  As directed    Diet - low sodium heart healthy   Complete by:  As directed    Discharge instructions   Complete by:  As directed    Barry Moore will be discharged to home.  Barry Moore will need to follow up with primary care provider within one week of discharge, repeat CBC and BMP.  Follow up with gastroenterology. Barry Moore should continue medications as prescribed.  Barry Moore should follow a heart healthy diet.   Discharge instructions   Complete by:  As directed    You were seen for a gastrointestinal bleed.  You had an upper endoscopy and a colonoscopy.    The upper endoscopy showed duodenitis (inflammation in the small intestine), esophagitis (inflammation in the esophagus), a fungal infection in the esophagus, duodenal ulcer (ulcer in the small intestine), barrett's esophagus, and small varicies and portal hypertensive gastropathy.  You've been started on protonix twice daily.  Please continue this and avoid aspirin and NSAIDs (ibuprofen, advil, motrin, etc).  You have been started on a 2 week course of fluconazole for the fungal infection.  Please follow up with GI as an outpatient regarding your biopsy results and plan for a repeat  endoscopy (they recommended repeat upper endoscopy in 3 months to follow up these findings).  Your colonoscopy showed hemorrhoids, polyps (which were resected), and an ulcer which was biopsied.  It's extremely important that you follow up with gastroenterology regarding the results of the biopsy and the polyps.    You'll need the acid blocker (proton pump inhibitor - protonix) for 8 weeks.  If you have abdominal pain, they may need to consider follow up with a CT angiogram.    Return for new, recurrent, or worsening symptoms.  Please ask your PCP to request records from this hospitalization so they know what was done and what the next steps will be.   Increase activity slowly   Complete by:  As directed      Allergies as of 08/09/2018      Reactions   Tomato Shortness Of Breath, Nausea And Vomiting   Aspirin Nausea And Vomiting      Medication List    STOP taking these medications   omeprazole 20 MG capsule Commonly known as:  PRILOSEC     TAKE these medications   ferrous sulfate 325 (65 FE) MG tablet Take 1 tablet (325 mg  total) by mouth daily with breakfast.   fluconazole 200 MG tablet Commonly known as:  DIFLUCAN Take 1 tablet (200 mg total) by mouth daily for 12 days. Start taking on:  Aug 09, 2681   folic acid 1 MG tablet Commonly known as:  FOLVITE Take 1 tablet (1 mg total) by mouth daily.   multivitamin with minerals Tabs tablet Take 1 tablet by mouth daily.   pantoprazole 40 MG tablet Commonly known as:  PROTONIX Take 1 tablet (40 mg total) by mouth 2 (two) times daily. What changed:    medication strength  how much to take  when to take this   sucralfate 1 GM/10ML suspension Commonly known as:  Carafate Take 10 mLs (1 g total) by mouth 4 (four) times daily -  with meals and at bedtime.   thiamine 100 MG tablet Take 1 tablet (100 mg total) by mouth daily.      Allergies  Allergen Reactions  . Tomato Shortness Of Breath and Nausea And Vomiting   . Aspirin Nausea And Vomiting   Follow-up Information    Mansouraty, Telford Nab., MD Follow up on 09/08/2018.   Specialties:  Gastroenterology, Internal Medicine Why:  9:30 AM.  virtual visit.  MD office will text you the link for the visit.   Contact information: [place] [address] [phone]        [facility] Follow up on 08/16/2018.   Why:  Hospital follow-up at 2:30pm. This will be a telephone visit. This office will call you at the time of appointment. Contact information: [address]  [place] [address] [phone]           The results of significant diagnostics from this hospitalization (including imaging, microbiology, ancillary and laboratory) are listed below for reference.    Significant Diagnostic Studies: Dg Chest Port 1 View  Result Date: 08/04/2018 CLINICAL DATA:  Chest pain EXAM: PORTABLE CHEST 1 VIEW COMPARISON:  May 24, 2018. FINDINGS: No edema or consolidation. Heart size and pulmonary vascularity are normal. No adenopathy. There is evidence of old trauma involving the medial left clavicle with remodeling. No pneumothorax. IMPRESSION: No edema or consolidation.  Stable cardiac silhouette. Electronically Signed   By: Lowella Grip III M.D.   On: 08/04/2018 20:27    Microbiology: Recent Results (from the past 240 hour(s))  SARS Coronavirus 2 (CEPHEID - Performed in [place] hospital lab), Hosp Order     Status: None   Collection Time: 08/04/18 10:07 PM  Result Value Ref Range Status   SARS Coronavirus 2 NEGATIVE NEGATIVE Final    Comment: (NOTE) If result is NEGATIVE SARS-CoV-2 target nucleic acids are NOT DETECTED. The SARS-CoV-2 RNA is generally detectable in upper and lower  respiratory specimens during the acute phase of infection. The lowest  concentration of SARS-CoV-2 viral copies this assay can detect is 250  copies / mL. A negative result does not preclude  SARS-CoV-2 infection  and should not be used as the sole basis for treatment or other  Barry Moore management decisions.  A negative result may occur with  improper specimen collection / handling, submission of specimen other  than nasopharyngeal swab, presence of viral mutation(s) within the  areas targeted by this assay, and inadequate number of viral copies  (<250 copies / mL). A negative result must be combined with clinical  observations, Barry Moore history, and epidemiological information. If result is POSITIVE SARS-CoV-2 target nucleic acids are DETECTED. The SARS-CoV-2  RNA is generally detectable in upper and lower  respiratory specimens dur ing the acute phase of infection.  Positive  results are indicative of active infection with SARS-CoV-2.  Clinical  correlation with Barry Moore history and other diagnostic information is  necessary to determine Barry Moore infection status.  Positive results do  not rule out bacterial infection or co-infection with other viruses. If result is PRESUMPTIVE POSTIVE SARS-CoV-2 nucleic acids MAY BE PRESENT.   A presumptive positive result was obtained on the submitted specimen  and confirmed on repeat testing.  While 2019 novel coronavirus  (SARS-CoV-2) nucleic acids may be present in the submitted sample  additional confirmatory testing may be necessary for epidemiological  and / or clinical management purposes  to differentiate between  SARS-CoV-2 and other Sarbecovirus currently known to infect humans.  If clinically indicated additional testing with an alternate test  methodology [phone]) is advised. The SARS-CoV-2 RNA is generally  detectable in upper and lower respiratory sp ecimens during the acute  phase of infection. The expected result is Negative. Fact Sheet for Patients:  StrictlyIdeas.no Fact Sheet for Healthcare Providers: BankingDealers.co.za This test is not yet approved or cleared by the  [place] FDA and has been authorized for detection and/or diagnosis of SARS-CoV-2 by FDA under an Emergency Use Authorization (EUA).  This EUA will remain in effect (meaning this test can be used) for the duration of the COVID-19 declaration under Section 564(b)(1) of the Act, 21 U.S.C. section 360bbb-3(b)(1), unless the authorization is terminated or revoked sooner. Performed at [facility], [place] [address], [place], [place] [address]      Labs: Basic Metabolic Panel: Recent Labs  Lab 08/06/18 0336 08/06/18 2010 08/07/18 0244 08/08/18 0330 08/09/18 0306  NA 126* 127* 131* 132* 133*  K 3.5 3.1* 3.6 3.5 3.7  CL 94* 95* 104 105 101  CO2 24 23 19* 21* 23  GLUCOSE 114* 83 96 99 94  BUN <5* <5* <5* <5* <5*  CREATININE 0.67 0.74 0.70 0.68 0.66  CALCIUM 8.3* 8.5* 8.3* 8.2* 8.6*   Liver Function Tests: Recent Labs  Lab 08/04/18 2042 08/05/18 0944  AST 139* 139*  ALT 32 31  ALKPHOS 137* 120  BILITOT 1.1 1.3*  PROT 7.9 6.9  ALBUMIN 3.3* 3.0*   Recent Labs  Lab 08/04/18 2042  LIPASE 48   No results for input(s): AMMONIA in the last 168 hours. CBC: Recent Labs  Lab 08/04/18 2042 08/05/18 0944 08/06/18 0336 08/07/18 0244 08/08/18 0330 08/09/18 0306  WBC 3.4* 2.8* 3.4* 4.0  --  3.7*  NEUTROABS 1.4* 1.4*  --   --   --   --   HGB 9.7* 8.1* 8.8* 8.2* 7.6* 7.4*  HCT 30.4* 25.7* 27.8* 26.3* 24.9* 24.3*  MCV 77.2* 78.1* 77.0* 79.2*  --  80.2  PLT 80* 83* 76* 83*  --  92*   Cardiac Enzymes: Recent Labs  Lab 08/04/18 2042  TROPONINI <0.03   BNP: BNP (last 3 results) No results for input(s): BNP in the last 8760 hours.  ProBNP (last 3 results) No results for input(s): PROBNP in the last 8760 hours.  CBG: No results for input(s): GLUCAP in the last 168 hours.     Signed:  Fayrene Helper MD.  [facility] 08/09/2018, 3:25 PM

## 2018-08-13 ENCOUNTER — Emergency Department (HOSPITAL_COMMUNITY): Payer: Self-pay

## 2018-08-13 ENCOUNTER — Emergency Department (HOSPITAL_COMMUNITY)
Admission: EM | Admit: 2018-08-13 | Discharge: 2018-08-14 | Disposition: A | Discharge status: Home / Self Care | Payer: Self-pay | Attending: Emergency Medicine | Admitting: Emergency Medicine

## 2018-08-13 ENCOUNTER — Other Ambulatory Visit: Payer: Self-pay

## 2018-08-13 DIAGNOSIS — F10220 Alcohol dependence with intoxication, uncomplicated: Secondary | ICD-10-CM | POA: Insufficient documentation

## 2018-08-13 DIAGNOSIS — Y999 Unspecified external cause status: Secondary | ICD-10-CM | POA: Insufficient documentation

## 2018-08-13 DIAGNOSIS — S00212A Abrasion of left eyelid and periocular area, initial encounter: Secondary | ICD-10-CM | POA: Insufficient documentation

## 2018-08-13 DIAGNOSIS — Y9389 Activity, other specified: Secondary | ICD-10-CM | POA: Insufficient documentation

## 2018-08-13 DIAGNOSIS — Y908 Blood alcohol level of 240 mg/100 ml or more: Secondary | ICD-10-CM | POA: Insufficient documentation

## 2018-08-13 DIAGNOSIS — F17210 Nicotine dependence, cigarettes, uncomplicated: Secondary | ICD-10-CM | POA: Insufficient documentation

## 2018-08-13 DIAGNOSIS — S20211A Contusion of right front wall of thorax, initial encounter: Secondary | ICD-10-CM | POA: Insufficient documentation

## 2018-08-13 DIAGNOSIS — I10 Essential (primary) hypertension: Secondary | ICD-10-CM | POA: Insufficient documentation

## 2018-08-13 DIAGNOSIS — F10920 Alcohol use, unspecified with intoxication, uncomplicated: Secondary | ICD-10-CM

## 2018-08-13 DIAGNOSIS — S0083XA Contusion of other part of head, initial encounter: Secondary | ICD-10-CM | POA: Insufficient documentation

## 2018-08-13 DIAGNOSIS — S0031XA Abrasion of nose, initial encounter: Secondary | ICD-10-CM | POA: Insufficient documentation

## 2018-08-13 DIAGNOSIS — Y929 Unspecified place or not applicable: Secondary | ICD-10-CM | POA: Insufficient documentation
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
## 2018-08-14 LAB — COMPREHENSIVE METABOLIC PANEL
ALT: 44 U/L (ref 0–44)
AST: 123 U/L — ABNORMAL HIGH (ref 15–41)
Albumin: 3.3 g/dL — ABNORMAL LOW (ref 3.5–5.0)
Alkaline Phosphatase: 159 U/L — ABNORMAL HIGH (ref 38–126)
Anion gap: 9 (ref 5–15)
BUN: 5 mg/dL — ABNORMAL LOW (ref 6–20)
CO2: 26 mmol/L (ref 22–32)
Calcium: 8.5 mg/dL — ABNORMAL LOW (ref 8.9–10.3)
Chloride: 108 mmol/L (ref 98–111)
Creatinine, Ser: 0.67 mg/dL (ref 0.61–1.24)
GFR calc Af Amer: >60 mL/min (ref >60)
GFR calc non Af Amer: >60 mL/min (ref >60)
Glucose, Bld: 98 mg/dL (ref 70–99)
Potassium: 3.5 mmol/L (ref 3.5–5.1)
Sodium: 143 mmol/L (ref 135–145)
Total Bilirubin: 0.4 mg/dL (ref 0.3–1.2)
Total Protein: 7.6 g/dL (ref 6.5–8.1)

## 2018-08-14 LAB — CBC WITH DIFFERENTIAL/PLATELET
Abs Immature Granulocytes: 0.01 10*3/uL (ref 0.00–0.07)
Basophils Absolute: 0.1 10*3/uL (ref 0.0–0.1)
Basophils Relative: 1 %
Eosinophils Absolute: 0.1 10*3/uL (ref 0.0–0.5)
Eosinophils Relative: 2 %
HCT: 26.9 % — ABNORMAL LOW (ref 39.0–52.0)
Hemoglobin: 7.9 g/dL — ABNORMAL LOW (ref 13.0–17.0)
Immature Granulocytes: 0 %
Lymphocytes Relative: 41 %
Lymphs Abs: 1.7 10*3/uL (ref 0.7–4.0)
MCH: 25.2 pg — ABNORMAL LOW (ref 26.0–34.0)
MCHC: 29.4 g/dL — ABNORMAL LOW (ref 30.0–36.0)
MCV: 85.9 fL (ref 80.0–100.0)
Monocytes Absolute: 0.7 10*3/uL (ref 0.1–1.0)
Monocytes Relative: 17 %
Neutro Abs: 1.6 10*3/uL — ABNORMAL LOW (ref 1.7–7.7)
Neutrophils Relative %: 39 %
Platelets: 164 10*3/uL (ref 150–400)
RBC: 3.13 MIL/uL — ABNORMAL LOW (ref 4.22–5.81)
RDW: 22.8 % — ABNORMAL HIGH (ref 11.5–15.5)
WBC: 4.2 10*3/uL (ref 4.0–10.5)
nRBC: 0 % (ref 0.0–0.2)

## 2018-08-14 LAB — RAPID URINE DRUG SCREEN, HOSP PERFORMED
Amphetamines: NOT DETECTED
Barbiturates: NOT DETECTED
Benzodiazepines: NOT DETECTED
Cocaine: NOT DETECTED
Opiates: NOT DETECTED
Tetrahydrocannabinol: NOT DETECTED

## 2018-08-14 LAB — ETHANOL: Alcohol, Ethyl (B): 334 mg/dL (ref <10)

## 2018-08-16 ENCOUNTER — Inpatient Hospital Stay: Payer: Self-pay | Admitting: Family Medicine

## 2018-08-17 ENCOUNTER — Emergency Department (HOSPITAL_COMMUNITY)
Admission: EM | Admit: 2018-08-17 | Discharge: 2018-08-18 | Disposition: A | Discharge status: Home / Self Care | Payer: Self-pay | Attending: Emergency Medicine | Admitting: Emergency Medicine

## 2018-08-17 ENCOUNTER — Other Ambulatory Visit: Payer: Self-pay

## 2018-08-17 ENCOUNTER — Encounter (HOSPITAL_COMMUNITY): Payer: Self-pay

## 2018-08-17 DIAGNOSIS — R45851 Suicidal ideations: Secondary | ICD-10-CM | POA: Insufficient documentation

## 2018-08-17 DIAGNOSIS — F1024 Alcohol dependence with alcohol-induced mood disorder: Secondary | ICD-10-CM | POA: Insufficient documentation

## 2018-08-17 DIAGNOSIS — Z1159 Encounter for screening for other viral diseases: Secondary | ICD-10-CM | POA: Insufficient documentation

## 2018-08-17 DIAGNOSIS — F17210 Nicotine dependence, cigarettes, uncomplicated: Secondary | ICD-10-CM | POA: Insufficient documentation

## 2018-08-17 DIAGNOSIS — F1410 Cocaine abuse, uncomplicated: Secondary | ICD-10-CM | POA: Insufficient documentation

## 2018-08-17 DIAGNOSIS — F332 Major depressive disorder, recurrent severe without psychotic features: Secondary | ICD-10-CM | POA: Insufficient documentation

## 2018-08-17 DIAGNOSIS — I10 Essential (primary) hypertension: Secondary | ICD-10-CM | POA: Insufficient documentation

## 2018-08-17 DIAGNOSIS — F10929 Alcohol use, unspecified with intoxication, unspecified: Secondary | ICD-10-CM | POA: Insufficient documentation

## 2018-08-17 LAB — COMPREHENSIVE METABOLIC PANEL
ALT: 35 U/L (ref 0–44)
AST: 112 U/L — ABNORMAL HIGH (ref 15–41)
Albumin: 3.3 g/dL — ABNORMAL LOW (ref 3.5–5.0)
Alkaline Phosphatase: 158 U/L — ABNORMAL HIGH (ref 38–126)
Anion gap: 9 (ref 5–15)
BUN: 5 mg/dL — ABNORMAL LOW (ref 6–20)
CO2: 22 mmol/L (ref 22–32)
Calcium: 8.3 mg/dL — ABNORMAL LOW (ref 8.9–10.3)
Chloride: 108 mmol/L (ref 98–111)
Creatinine, Ser: 0.64 mg/dL (ref 0.61–1.24)
GFR calc Af Amer: >60 mL/min (ref >60)
GFR calc non Af Amer: >60 mL/min (ref >60)
Glucose, Bld: 91 mg/dL (ref 70–99)
Potassium: 3.5 mmol/L (ref 3.5–5.1)
Sodium: 139 mmol/L (ref 135–145)
Total Bilirubin: 0.5 mg/dL (ref 0.3–1.2)
Total Protein: 7.7 g/dL (ref 6.5–8.1)

## 2018-08-17 LAB — CBC WITH DIFFERENTIAL/PLATELET
Abs Immature Granulocytes: 0.01 10*3/uL (ref 0.00–0.07)
Basophils Absolute: 0 10*3/uL (ref 0.0–0.1)
Basophils Relative: 1 %
Eosinophils Absolute: 0.1 10*3/uL (ref 0.0–0.5)
Eosinophils Relative: 2 %
HCT: 27.3 % — ABNORMAL LOW (ref 39.0–52.0)
Hemoglobin: 8.4 g/dL — ABNORMAL LOW (ref 13.0–17.0)
Immature Granulocytes: 0 %
Lymphocytes Relative: 32 %
Lymphs Abs: 1.5 10*3/uL (ref 0.7–4.0)
MCH: 26.5 pg (ref 26.0–34.0)
MCHC: 30.8 g/dL (ref 30.0–36.0)
MCV: 86.1 fL (ref 80.0–100.0)
Monocytes Absolute: 0.7 10*3/uL (ref 0.1–1.0)
Monocytes Relative: 15 %
Neutro Abs: 2.3 10*3/uL (ref 1.7–7.7)
Neutrophils Relative %: 50 %
Platelets: 128 10*3/uL — ABNORMAL LOW (ref 150–400)
RBC: 3.17 MIL/uL — ABNORMAL LOW (ref 4.22–5.81)
RDW: 24.2 % — ABNORMAL HIGH (ref 11.5–15.5)
WBC: 4.6 10*3/uL (ref 4.0–10.5)
nRBC: 0 % (ref 0.0–0.2)

## 2018-08-17 LAB — TROPONIN I: Troponin I: <0.03 ng/mL (ref <0.03)

## 2018-08-17 LAB — RAPID URINE DRUG SCREEN, HOSP PERFORMED
Amphetamines: NOT DETECTED
Barbiturates: NOT DETECTED
Benzodiazepines: NOT DETECTED
Cocaine: NOT DETECTED
Opiates: NOT DETECTED
Tetrahydrocannabinol: NOT DETECTED

## 2018-08-17 LAB — SALICYLATE LEVEL: Salicylate Lvl: <7 mg/dL (ref 2.8–30.0)

## 2018-08-17 LAB — LIPASE, BLOOD: Lipase: 42 U/L (ref 11–51)

## 2018-08-17 LAB — LACTIC ACID, PLASMA: Lactic Acid, Venous: 1.2 mmol/L (ref 0.5–1.9)

## 2018-08-17 LAB — ETHANOL: Alcohol, Ethyl (B): 295 mg/dL — ABNORMAL HIGH (ref <10)

## 2018-08-17 LAB — ACETAMINOPHEN LEVEL: Acetaminophen (Tylenol), Serum: <10 ug/mL — ABNORMAL LOW (ref 10–30)

## 2018-08-17 MED ORDER — SODIUM CHLORIDE 0.9 % IV BOLUS
1000.0000 mL | Freq: Once | INTRAVENOUS | Status: AC
  Administered 2018-08-17: 1000 mL via INTRAVENOUS

## 2018-08-17 NOTE — ED Provider Notes (Addendum)
[facility] Emergency Department Provider Note MRN:  [id number]  Arrival date & time: 08/17/18     Chief Complaint   Seizures and Suicidal   History of Present Illness   Barry Moore is a 53 y.o. year-old male with a history of alcoholic hepatitis, alcohol abuse, depression presenting to the ED with chief complaint of seizures and suicidal ideation.  Report of withdrawal seizure this morning, followed by continued alcohol intake today.  Was found with a knife, threatening to cut himself.  Patient is endorsing suicidal ideation, hoping to "drink himself to death".  I was unable to obtain an accurate HPI, PMH, or ROS due to the patient's alcohol intoxication.  Review of Systems  Positive for suicidal ideation, alcohol abuse, seizure.  Patient's Health History    Past Medical History:  Diagnosis Date  . Alcoholic hepatitis   . Depression   . ETOH abuse   . Hypertension   . Pancreatitis   . Seizures ([place])   . Suicidal behavior     Past Surgical History:  Procedure Laterality Date  . BIOPSY  08/05/2018   Procedure: BIOPSY;  Surgeon: Irving Copas., MD;  Location: [place];  Service: Gastroenterology;;  . BIOPSY  08/07/2018   Procedure: BIOPSY;  Surgeon: Thornton Park, MD;  Location: [place];  Service: Gastroenterology;;  . COLONOSCOPY WITH PROPOFOL N/A 08/07/2018   Procedure: COLONOSCOPY WITH PROPOFOL;  Surgeon: Thornton Park, MD;  Location: [place];  Service: Gastroenterology;  Laterality: N/A;  . ESOPHAGOGASTRODUODENOSCOPY (EGD) WITH PROPOFOL N/A 11/02/2016   Procedure: ESOPHAGOGASTRODUODENOSCOPY (EGD) WITH PROPOFOL;  Surgeon: Carol Ada, MD;  Location: [facility];  Service: Endoscopy;  Laterality: N/A;  . ESOPHAGOGASTRODUODENOSCOPY (EGD) WITH PROPOFOL N/A 08/05/2018   Procedure: ESOPHAGOGASTRODUODENOSCOPY (EGD) WITH PROPOFOL;  Surgeon: Rush Landmark Telford Nab., MD;  Location: [place];  Service: Gastroenterology;   Laterality: N/A;  . HERNIA REPAIR    . LEG SURGERY    . POLYPECTOMY  08/07/2018   Procedure: POLYPECTOMY;  Surgeon: Thornton Park, MD;  Location: [facility] ENDOSCOPY;  Service: Gastroenterology;;    Family History  Problem Relation Age of Onset  . Diabetes Mother   . Alcoholism Mother   . Emphysema Father   . Lung cancer Father   . Alcoholism Father     Social History   Socioeconomic History  . Marital status: Divorced    Spouse name: Not on file  . Number of children: Not on file  . Years of education: Not on file  . Highest education level: Not on file  Occupational History  . Not on file  Social Needs  . Financial resource strain: Not on file  . Food insecurity:    Worry: Not on file    Inability: Not on file  . Transportation needs:    Medical: Not on file    Non-medical: Not on file  Tobacco Use  . Smoking status: Current Every Day Smoker    Packs/day: 1.00    Types: Cigarettes  . Smokeless tobacco: Never Used  Substance and Sexual Activity  . Alcohol use: Yes    Comment: 4-5 40oz beers daily  . Drug use: Not Currently    Frequency: 3.0 times per week    Types: Cocaine  . Sexual activity: Not Currently  Lifestyle  . Physical activity:    Days per week: Not on file    Minutes per session: Not on file  . Stress: Not on file  Relationships  . Social connections:    Talks on  phone: Not on file    Gets together: Not on file    Attends religious service: Not on file    Active member of club or organization: Not on file    Attends meetings of clubs or organizations: Not on file    Relationship status: Not on file  . Intimate partner violence:    Fear of current or ex partner: Not on file    Emotionally abused: Not on file    Physically abused: Not on file    Forced sexual activity: Not on file  Other Topics Concern  . Not on file  Social History Narrative  . Not on file     Physical Exam  Vital Signs and Nursing Notes reviewed Vitals:   08/17/18 2230  08/17/18 2330  BP: 121/76 110/78  Pulse: 96 87  Resp: 14 16  Temp:    SpO2: 100% 99%    CONSTITUTIONAL: Chronically ill-appearing, NAD NEURO: Somnolent but wakes to voice, moving all extremities, confused, inebriated EYES:  eyes equal and reactive ENT/NECK:  no LAD, no JVD CARDIO: Regular rate, well-perfused, normal S1 and S2 PULM:  CTAB no wheezing or rhonchi GI/GU:  normal bowel sounds, non-distended, moderate epigastric tenderness to palpation MSK/SPINE:  No gross deformities, no edema SKIN:  no rash; superficial abrasion to the left forearm PSYCH:  Appropriate speech and behavior  Diagnostic and Interventional Summary    EKG Interpretation  Date/Time:  Thursday Aug 17 2018 20:38:16 EDT Ventricular Rate:  89 PR Interval:    QRS Duration: 94 QT Interval:  356 QTC Calculation: 434 R Axis:   81 Text Interpretation:  Sinus rhythm Confirmed by Gerlene Fee [phone]) on 08/17/2018 9:24:17 PM      Labs Reviewed  COMPREHENSIVE METABOLIC PANEL - Abnormal; Notable for the following components:      Result Value   BUN 5 (*)    Calcium 8.3 (*)    Albumin 3.3 (*)    AST 112 (*)    Alkaline Phosphatase 158 (*)    All other components within normal limits  ETHANOL - Abnormal; Notable for the following components:   Alcohol, Ethyl (B) 295 (*)    All other components within normal limits  CBC WITH DIFFERENTIAL/PLATELET - Abnormal; Notable for the following components:   RBC 3.17 (*)    Hemoglobin 8.4 (*)    HCT 27.3 (*)    RDW 24.2 (*)    Platelets 128 (*)    All other components within normal limits  ACETAMINOPHEN LEVEL - Abnormal; Notable for the following components:   Acetaminophen (Tylenol), Serum <10 (*)    All other components within normal limits  SARS CORONAVIRUS 2 (HOSPITAL ORDER, [place] [facility])  RAPID URINE DRUG SCREEN, HOSP PERFORMED  TROPONIN I  SALICYLATE LEVEL  LACTIC ACID, PLASMA  LIPASE, BLOOD    No orders to display     Medications  sodium chloride 0.9 % bolus 1,000 mL (0 mLs Intravenous Stopped 08/17/18 2235)     Procedures Critical Care  ED Course and Medical Decision Making  I have reviewed the triage vital signs and the nursing notes.  Pertinent labs & imaging results that were available during my care of the patient were reviewed by me and considered in my medical decision making (see below for details).  Acute alcohol intoxication, depression, suicidality in this 53 year old male, homeless, describing hopelessness.  Normal vital signs, patient has a tender epigastrium, will check lipase, screening labs for overdose.  Labs are  reassuring.  Ethanol 240.  Upon reassessment patient appears more sober, still endorsing suicidal ideation.  Abdominal exam greatly improved, no tenderness, patient is requesting food.  TTS consulted, awaiting recommendations.  Signed out default provider at shift change.  Barth Kirks. [place] Small, [place] [email or web address]  Final Clinical Impressions(s) / ED Diagnoses     ICD-10-CM   1. Suicidal ideation R45.851   2. Alcoholic intoxication with complication [facility]) O27.035     ED Discharge Orders    None         [name], MD 08/17/18 2342    [name], MD 08/18/18 0002

## 2018-08-17 NOTE — ED Triage Notes (Signed)
Pt arrived via [facility] due to reporting having a seizure stating he woke up and felt postical. ETOH on board. Pt suicidal, stating he would cut himself. Per EMS patient made statements of being hopeless, then proceded to pull out a knife in the back of the truck and cut himself. Knife is now out of patients custody.

## 2018-08-18 DIAGNOSIS — F1024 Alcohol dependence with alcohol-induced mood disorder: Secondary | ICD-10-CM

## 2018-08-18 LAB — SARS CORONAVIRUS 2 BY RT PCR (HOSPITAL ORDER, PERFORMED IN ~~LOC~~ HOSPITAL LAB): SARS Coronavirus 2: NEGATIVE

## 2018-08-18 MED ORDER — LORAZEPAM 2 MG/ML IJ SOLN: 0.0000 mg | Freq: Two times a day (BID) | INTRAMUSCULAR | Status: DC

## 2018-08-18 MED ORDER — THIAMINE HCL 100 MG/ML IJ SOLN: 100.0000 mg | Freq: Every day | INTRAMUSCULAR | Status: DC

## 2018-08-18 MED ORDER — VITAMIN B-1 100 MG PO TABS
100.0000 mg | ORAL_TABLET | Freq: Every day | ORAL | Status: DC
  Administered 2018-08-18: 100 mg via ORAL
  Filled 2018-08-18: qty 1

## 2018-08-18 MED ORDER — LORAZEPAM 1 MG PO TABS
0.0000 mg | ORAL_TABLET | Freq: Four times a day (QID) | ORAL | Status: DC
  Administered 2018-08-18: 1 mg via ORAL
  Filled 2018-08-18 (×2): qty 1

## 2018-08-18 MED ORDER — LORAZEPAM 2 MG/ML IJ SOLN
0.0000 mg | Freq: Four times a day (QID) | INTRAMUSCULAR | Status: DC
  Administered 2018-08-18: 2 mg via INTRAVENOUS
  Filled 2018-08-18: qty 1

## 2018-08-18 MED ORDER — LORAZEPAM 1 MG PO TABS: 0.0000 mg | ORAL_TABLET | Freq: Two times a day (BID) | ORAL | Status: DC

## 2018-08-18 NOTE — Consult Note (Addendum)
[facility] Psych ED Discharge  08/18/2018 12:44 PM Barry Moore  MRN:  [id number] Principal Problem: Alcohol dependence with alcohol-induced mood disorder [facility]) Discharge Diagnoses: Principal Problem:   Alcohol dependence with alcohol-induced mood disorder ([place])   Subjective: Pt was seen via telepsych and chart reviewed with treatment team and Dr Mariea Clonts. Pt is well known to the ED system and frequently comes in intoxicated with suicidal ideation. His BAL was 295 on admission. He will often state he had a seizure and when he wakes up he will state he felt postictal so he comes to the ED. 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  Pt denies suicidal/homicidal ideation, denies auditory/visual hallucinations and does not appear to be responding to internal stimuli. Pt requested outpatient resources on discharge, is able to contract for safety, and declines to speak with peer support. Pt is psychiatrically clear.   Total Time spent with patient: 30 minutes  Past Psychiatric History: As above  Past Medical History:  Past Medical History:  Diagnosis Date  . Alcoholic hepatitis   . Depression   . ETOH abuse   . Hypertension   . Pancreatitis   . Seizures ([place])   . Suicidal behavior     Past Surgical History:  Procedure Laterality Date  . BIOPSY  08/05/2018   Procedure: BIOPSY;  Surgeon: Irving Copas., MD;  Location: [place];  Service: Gastroenterology;;  . BIOPSY  08/07/2018   Procedure: BIOPSY;  Surgeon: Thornton Park, MD;  Location: [place];  Service: Gastroenterology;;  . COLONOSCOPY WITH PROPOFOL N/A 08/07/2018   Procedure: COLONOSCOPY WITH PROPOFOL;  Surgeon: Thornton Park, MD;  Location: [place];  Service: Gastroenterology;  Laterality: N/A;  . ESOPHAGOGASTRODUODENOSCOPY (EGD) WITH PROPOFOL N/A 11/02/2016   Procedure: ESOPHAGOGASTRODUODENOSCOPY (EGD) WITH PROPOFOL;  Surgeon: Carol Ada, MD;  [facility];   Service: Endoscopy;  Laterality: N/A;  . ESOPHAGOGASTRODUODENOSCOPY (EGD) WITH PROPOFOL N/A 08/05/2018   Procedure: ESOPHAGOGASTRODUODENOSCOPY (EGD) WITH PROPOFOL;  Surgeon: Rush Landmark Telford Nab., MD;  Location: [place];  Service: Gastroenterology;  Laterality: N/A;  . HERNIA REPAIR    . LEG SURGERY    . POLYPECTOMY  08/07/2018   Procedure: POLYPECTOMY;  Surgeon: Thornton Park, MD;  Location: [facility] ENDOSCOPY;  Service: Gastroenterology;;   Family History:  Family History  Problem Relation Age of Onset  . Diabetes Mother   . Alcoholism Mother   . Emphysema Father   . Lung cancer Father   . Alcoholism Father    Family Psychiatric  History: Pt did not give this information  Social History:  Social History   Substance and Sexual Activity  Alcohol Use Yes   Comment: 4-5 40oz beers daily     Social History   Substance and Sexual Activity  Drug Use Not Currently  . Frequency: 3.0 times per week  . Types: Cocaine    Social History   Socioeconomic History  . Marital status: Divorced    Spouse name: Not on file  . Number of children: Not on file  . Years of education: Not on file  . Highest education level: Not on file  Occupational History  . Not on file  Social Needs  . Financial resource strain: Not on file  . Food insecurity:    Worry: Not on file    Inability: Not on file  . Transportation needs:    Medical: Not on file    Non-medical: Not on file  Tobacco Use  . Smoking status:  Current Every Day Smoker    Packs/day: 1.00    Types: Cigarettes  . Smokeless tobacco: Never Used  Substance and Sexual Activity  . Alcohol use: Yes    Comment: 4-5 40oz beers daily  . Drug use: Not Currently    Frequency: 3.0 times per week    Types: Cocaine  . Sexual activity: Not Currently  Lifestyle  . Physical activity:    Days per week: Not on file    Minutes per session: Not on file  . Stress: Not on file  Relationships  . Social connections:    Talks on phone: Not  on file    Gets together: Not on file    Attends religious service: Not on file    Active member of club or organization: Not on file    Attends meetings of clubs or organizations: Not on file    Relationship status: Not on file  Other Topics Concern  . Not on file  Social History Narrative  . Not on file    Has this patient used any form of tobacco in the last 30 days? (Cigarettes, Smokeless Tobacco, Cigars, and/or Pipes) Prescription not provided because: Pt declined  Current Medications: Current Facility-Administered Medications  Medication Dose Route Frequency Provider Last Rate Last Dose  . LORazepam (ATIVAN) injection 0-4 mg  0-4 mg Intravenous Q6H [name], MD   2 mg at 08/18/18 0640   Or  . LORazepam (ATIVAN) tablet 0-4 mg  0-4 mg Oral Q6H [name], MD   1 mg at 08/18/18 1133  . [START ON 08/20/2018] LORazepam (ATIVAN) injection 0-4 mg  0-4 mg Intravenous Q12H [name], MD       Or  . Derrill Memo ON 08/20/2018] LORazepam (ATIVAN) tablet 0-4 mg  0-4 mg Oral Q12H [name], MD      . thiamine (VITAMIN B-1) tablet 100 mg  100 mg Oral Daily [name], MD   100 mg at 08/18/18 1026   Or  . thiamine (B-1) injection 100 mg  100 mg Intravenous Daily [name], MD       Current Outpatient Medications  Medication Sig Dispense Refill  . ferrous sulfate 325 (65 FE) MG tablet Take 1 tablet (325 mg total) by mouth daily with breakfast. 30 tablet 11  . fluconazole (DIFLUCAN) 200 MG tablet Take 1 tablet (200 mg total) by mouth daily for 12 days. 12 tablet 0  . folic acid (FOLVITE) 1 MG tablet Take 1 tablet (1 mg total) by mouth daily. 30 tablet 0  . Multiple Vitamin (MULTIVITAMIN WITH MINERALS) TABS tablet Take 1 tablet by mouth daily. 30 tablet 0  . pantoprazole (PROTONIX) 40 MG tablet Take 1 tablet (40 mg total) by mouth 2 (two) times daily. 60 tablet 0  . sucralfate (CARAFATE) 1 GM/10ML suspension Take 10 mLs (1 g total) by mouth 4 (four) times daily -   with meals and at bedtime. (Patient not taking: Reported on 08/04/2018) 420 mL 0  . thiamine 100 MG tablet Take 1 tablet (100 mg total) by mouth daily. 30 tablet 0     Musculoskeletal: Strength & Muscle Tone: within normal limits Gait & Station: not tested Patient leans: N/A  Psychiatric Specialty Exam: Physical Exam  Nursing note and vitals reviewed. Constitutional: He is oriented to person, place, and time. He appears well-developed and well-nourished.  HENT:  Head: Normocephalic and atraumatic.  Neck: Normal range of motion.  Respiratory: Effort normal.  Musculoskeletal: Normal  range of motion.  Neurological: He is alert and oriented to person, place, and time.  Psychiatric: His speech is normal and behavior is normal. Judgment and thought content normal. Cognition and memory are normal. He exhibits a depressed mood.    Review of Systems  Psychiatric/Behavioral: Positive for substance abuse. Negative for suicidal ideas.  All other systems reviewed and are negative.   Blood pressure 136/74, pulse 86, temperature 98.9 F (37.2 C), temperature source Oral, resp. rate 17, height 5\' 8"  (1.727 m), weight 68 kg, SpO2 100 %.Body mass index is 22.79 kg/m.  General Appearance: Casual  Eye Contact:  Fair  Speech:  Clear and Coherent and Normal Rate  Volume:  Normal  Mood:  Depressed  Affect:  Congruent and Depressed  Thought Process:  Coherent and Descriptions of Associations: Intact  Orientation:  Full (Time, Place, and Person)  Thought Content:  Logical  Suicidal Thoughts:  No  Homicidal Thoughts:  No  Memory:  Immediate;   Good Recent;   Fair Remote;   Fair  Judgement:  Fair  Insight:  Fair  Psychomotor Activity:  Normal  Concentration:  Concentration: Fair and Attention Span: Fair  Recall:  [employer] of Knowledge:  Good  Language:  Good  Akathisia:  No  Handed:  Right  AIMS (if indicated):   N/A  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  Patients presenting with no risk factors but with morbid ruminations; may be classified as minimal risk based on the severity of the depressive symptoms   Plan Of Care/Follow-up recommendations:  Activity:  as tolerated Diet:  Heart healthy  Disposition: Alcohol abuse with alcohol induced mood disorder  Take all medications as prescribed by your outpatient provider. Keep all follow-up appointments as scheduled. Contact the resources in your discharge paperwork for assistance with substance abuse treatment and medication management.  Do not consume alcohol or use illegal drugs while on prescription medications. Report any adverse effects from your medications to your primary care provider promptly.  In the event of recurrent symptoms or worsening symptoms, call 911, a crisis hotline, or go to the nearest emergency department for evaluation.   Ethelene Hal, NP 08/18/2018, 12:44 PM   Patient seen by telemedicine, chart reviewed and case discussed with the physician extender and developed treatment plan. Reviewed the information documented and agree with the treatment plan.  Buford Dresser, DO 08/18/18 4:57 PM

## 2018-08-18 NOTE — ED Notes (Signed)
TTS called and said pt is recommend for inpatient. There aren't any beds, so he will be holding here for placement.

## 2018-08-18 NOTE — ED Notes (Signed)
Patient given ham sandwich and a coke per request.

## 2018-08-18 NOTE — Discharge Instructions (Signed)
For your behavioral health needs, you are advised to follow up with [facility].  Call them at your earliest opportunity to schedule an intake appointment:       [facility]      [place], [place] [address]      [phone]

## 2018-08-18 NOTE — BH Assessment (Signed)
[facility] Assessment Progress Note  Per Jinny Blossom, FNP, this pt does not require psychiatric hospitalization at this time.  Pt is to be discharged from [facility] with recommendation to follow up with [facility].  This has been included in pt's discharge instructions.  Pt's nurse has been notified.  Barry Moore, [place] Triage Specialist [phone]

## 2018-08-18 NOTE — Progress Notes (Signed)
Lindon Romp, NP recommends inpt treatment. TTS to seek placement due to [facility] at capacity per [facility]. Pt's nurse Joellen Jersey, RN and EDP [name], MD have been advised.   Lind Covert, MSW, LCSW Therapeutic Triage Specialist  [phone]
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
## 2018-08-18 NOTE — ED Notes (Signed)
Pt asleep. Sitter at bedside.

## 2018-08-18 NOTE — ED Notes (Signed)
[facility] NP stated that as long as pt was clinically sober he could be discharged. Pt alert, oriented, ambulatory upon d/c. No signs of acute distress. Two belongings bags and one personal backpack given to patient.

## 2018-08-18 NOTE — ED Notes (Signed)
Sitter remains at bedside

## 2018-08-20 ENCOUNTER — Other Ambulatory Visit: Payer: Self-pay

## 2018-08-20 ENCOUNTER — Encounter (HOSPITAL_COMMUNITY): Payer: Self-pay | Admitting: Emergency Medicine

## 2018-08-20 ENCOUNTER — Emergency Department (HOSPITAL_COMMUNITY)
Admission: EM | Admit: 2018-08-20 | Discharge: 2018-08-21 | Disposition: A | Discharge status: Home / Self Care | Payer: Self-pay | Attending: Emergency Medicine | Admitting: Emergency Medicine

## 2018-08-20 DIAGNOSIS — F1010 Alcohol abuse, uncomplicated: Secondary | ICD-10-CM | POA: Insufficient documentation

## 2018-08-20 DIAGNOSIS — R45851 Suicidal ideations: Secondary | ICD-10-CM | POA: Insufficient documentation

## 2018-08-20 DIAGNOSIS — F333 Major depressive disorder, recurrent, severe with psychotic symptoms: Secondary | ICD-10-CM | POA: Insufficient documentation

## 2018-08-20 DIAGNOSIS — F1410 Cocaine abuse, uncomplicated: Secondary | ICD-10-CM | POA: Insufficient documentation

## 2018-08-20 DIAGNOSIS — F17210 Nicotine dependence, cigarettes, uncomplicated: Secondary | ICD-10-CM | POA: Insufficient documentation

## 2018-08-20 DIAGNOSIS — Z046 Encounter for general psychiatric examination, requested by authority: Secondary | ICD-10-CM | POA: Insufficient documentation

## 2018-08-20 DIAGNOSIS — R45850 Homicidal ideations: Secondary | ICD-10-CM | POA: Insufficient documentation

## 2018-08-20 DIAGNOSIS — Z1159 Encounter for screening for other viral diseases: Secondary | ICD-10-CM | POA: Insufficient documentation

## 2018-08-20 DIAGNOSIS — I10 Essential (primary) hypertension: Secondary | ICD-10-CM | POA: Insufficient documentation

## 2018-08-20 LAB — COMPREHENSIVE METABOLIC PANEL
ALT: 31 U/L (ref 0–44)
AST: 107 U/L — ABNORMAL HIGH (ref 15–41)
Albumin: 3.3 g/dL — ABNORMAL LOW (ref 3.5–5.0)
Alkaline Phosphatase: 161 U/L — ABNORMAL HIGH (ref 38–126)
Anion gap: 9 (ref 5–15)
BUN: <5 mg/dL — ABNORMAL LOW (ref 6–20)
CO2: 24 mmol/L (ref 22–32)
Calcium: 8.8 mg/dL — ABNORMAL LOW (ref 8.9–10.3)
Chloride: 106 mmol/L (ref 98–111)
Creatinine, Ser: 0.62 mg/dL (ref 0.61–1.24)
GFR calc Af Amer: >60 mL/min (ref >60)
GFR calc non Af Amer: >60 mL/min (ref >60)
Glucose, Bld: 113 mg/dL — ABNORMAL HIGH (ref 70–99)
Potassium: 3.5 mmol/L (ref 3.5–5.1)
Sodium: 139 mmol/L (ref 135–145)
Total Bilirubin: 0.9 mg/dL (ref 0.3–1.2)
Total Protein: 8.2 g/dL — ABNORMAL HIGH (ref 6.5–8.1)

## 2018-08-20 LAB — CBC
HCT: 30.7 % — ABNORMAL LOW (ref 39.0–52.0)
Hemoglobin: 9.5 g/dL — ABNORMAL LOW (ref 13.0–17.0)
MCH: 26.7 pg (ref 26.0–34.0)
MCHC: 30.9 g/dL (ref 30.0–36.0)
MCV: 86.2 fL (ref 80.0–100.0)
Platelets: 124 10*3/uL — ABNORMAL LOW (ref 150–400)
RBC: 3.56 MIL/uL — ABNORMAL LOW (ref 4.22–5.81)
RDW: 23.9 % — ABNORMAL HIGH (ref 11.5–15.5)
WBC: 5.4 10*3/uL (ref 4.0–10.5)
nRBC: 0 % (ref 0.0–0.2)

## 2018-08-20 LAB — RAPID URINE DRUG SCREEN, HOSP PERFORMED
Amphetamines: NOT DETECTED
Barbiturates: NOT DETECTED
Benzodiazepines: NOT DETECTED
Cocaine: NOT DETECTED
Opiates: NOT DETECTED
Tetrahydrocannabinol: NOT DETECTED

## 2018-08-20 LAB — SALICYLATE LEVEL: Salicylate Lvl: <7 mg/dL (ref 2.8–30.0)

## 2018-08-20 LAB — ACETAMINOPHEN LEVEL: Acetaminophen (Tylenol), Serum: <10 ug/mL — ABNORMAL LOW (ref 10–30)

## 2018-08-20 LAB — SARS CORONAVIRUS 2 BY RT PCR (HOSPITAL ORDER, PERFORMED IN ~~LOC~~ HOSPITAL LAB): SARS Coronavirus 2: NEGATIVE

## 2018-08-20 LAB — ETHANOL: Alcohol, Ethyl (B): 402 mg/dL (ref <10)

## 2018-08-20 MED ORDER — THIAMINE HCL 100 MG/ML IJ SOLN: 100.0000 mg | Freq: Every day | INTRAMUSCULAR | Status: DC

## 2018-08-20 MED ORDER — ALUM & MAG HYDROXIDE-SIMETH 200-200-20 MG/5ML PO SUSP: 30.0000 mL | Freq: Four times a day (QID) | ORAL | Status: DC | PRN

## 2018-08-20 MED ORDER — ADULT MULTIVITAMIN W/MINERALS CH: 1.0000 | ORAL_TABLET | Freq: Every day | ORAL | Status: DC

## 2018-08-20 MED ORDER — NICOTINE 21 MG/24HR TD PT24: 21.0000 mg | MEDICATED_PATCH | Freq: Every day | TRANSDERMAL | Status: DC

## 2018-08-20 MED ORDER — LORAZEPAM 1 MG PO TABS: 0.0000 mg | ORAL_TABLET | Freq: Two times a day (BID) | ORAL | Status: DC

## 2018-08-20 MED ORDER — VITAMIN B-1 100 MG PO TABS: 100.0000 mg | ORAL_TABLET | Freq: Every day | ORAL | Status: DC

## 2018-08-20 MED ORDER — LORAZEPAM 2 MG/ML IJ SOLN: 0.0000 mg | Freq: Four times a day (QID) | INTRAMUSCULAR | Status: DC

## 2018-08-20 MED ORDER — PANTOPRAZOLE SODIUM 40 MG PO TBEC
40.0000 mg | DELAYED_RELEASE_TABLET | Freq: Two times a day (BID) | ORAL | Status: DC
  Administered 2018-08-21: 40 mg via ORAL
  Filled 2018-08-20: qty 1

## 2018-08-20 MED ORDER — LORAZEPAM 2 MG/ML IJ SOLN: 0.0000 mg | Freq: Two times a day (BID) | INTRAMUSCULAR | Status: DC

## 2018-08-20 MED ORDER — FOLIC ACID 1 MG PO TABS: 1.0000 mg | ORAL_TABLET | Freq: Every day | ORAL | Status: DC

## 2018-08-20 MED ORDER — LORAZEPAM 1 MG PO TABS
0.0000 mg | ORAL_TABLET | Freq: Four times a day (QID) | ORAL | Status: DC
  Administered 2018-08-21: 06:00:00 2 mg via ORAL
  Filled 2018-08-20: qty 2

## 2018-08-20 NOTE — ED Provider Notes (Signed)
Emergency Department Provider Note   I have reviewed the triage vital signs and the nursing notes.   HISTORY  Chief Complaint Suicidal and Homicidal   HPI Barry Moore is a 53 y.o. male with PMH of EtOH abuse, homelessness, and depression presents to the emergency department for evaluation of suicidal thinking.  Patient states he has been drinking and thinking about hurting himself as well as other people.  When asked if he has a plan he states "yeah but I lost my knife."  Patient will not elaborate from there.  He is not experiencing any pain, nausea, or shortness of breath.   Level 5 caveat: EtOH intoxication.   Past Medical History:  Diagnosis Date  . Alcoholic hepatitis   . Depression   . ETOH abuse   . Hypertension   . Pancreatitis   . Seizures ([place])   . Suicidal behavior     Patient Active Problem List   Diagnosis Date Noted  . Colon ulcer   . Polyp of ascending colon   . Acute GI bleeding 08/05/2018  . Transaminitis 08/05/2018  . Abdominal pain 08/05/2018  . Nausea and vomiting 08/05/2018  . Substance induced mood disorder ([place]) 07/16/2017  . Severe recurrent major depression without psychotic features ([place]) 05/15/2017  . Anemia 03/18/2017  . Acute blood loss anemia 02/21/2017  . UGI bleed 02/08/2017  . Alcohol withdrawal ([place]) 02/08/2017  . Cocaine abuse ([place]) 11/18/2016  . GI bleed 11/01/2016  . Alcohol abuse   . Major depressive disorder, recurrent severe without psychotic features ([place]) 07/06/2016  . 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 Seizure ([place]) 05/26/2016  . Tobacco abuse 05/26/2016  . Homeless 05/26/2016  . Essential hypertension 05/26/2016  . Alcohol dependence with alcohol-induced mood disorder ([place]) 02/14/2016  . Severe alcohol withdrawal without perceptual disturbances without complication ([place]) 51/76/1607  . Alcoholic hepatitis without ascites 02/14/2016  . Thrombocytopenia ([place]) 02/14/2016    Past Surgical History:   Procedure Laterality Date  . BIOPSY  08/05/2018   Procedure: BIOPSY;  Surgeon: Irving Copas., MD;  Location: [place];  Service: Gastroenterology;;  . BIOPSY  08/07/2018   Procedure: BIOPSY;  Surgeon: Thornton Park, MD;  Location: [place];  Service: Gastroenterology;;  . COLONOSCOPY WITH PROPOFOL N/A 08/07/2018   Procedure: COLONOSCOPY WITH PROPOFOL;  Surgeon: Thornton Park, MD;  Location: [place];  Service: Gastroenterology;  Laterality: N/A;  . ESOPHAGOGASTRODUODENOSCOPY (EGD) WITH PROPOFOL N/A 11/02/2016   Procedure: ESOPHAGOGASTRODUODENOSCOPY (EGD) WITH PROPOFOL;  Surgeon: Carol Ada, MD;  [facility];  Service: Endoscopy;  Laterality: N/A;  . ESOPHAGOGASTRODUODENOSCOPY (EGD) WITH PROPOFOL N/A 08/05/2018   Procedure: ESOPHAGOGASTRODUODENOSCOPY (EGD) WITH PROPOFOL;  Surgeon: Rush Landmark Telford Nab., MD;  Location: [place];  Service: Gastroenterology;  Laterality: N/A;  . HERNIA REPAIR    . LEG SURGERY    . POLYPECTOMY  08/07/2018   Procedure: POLYPECTOMY;  Surgeon: Thornton Park, MD;  Location: [facility] ENDOSCOPY;  Service: Gastroenterology;;    Allergies Tomato and Aspirin  Family History  Problem Relation Age of Onset  . Diabetes Mother   . Alcoholism Mother   . Emphysema Father   . Lung cancer Father   . Alcoholism Father     Social History Social History   Tobacco Use  . Smoking status: Current Every Day Smoker    Packs/day: 1.00    Types: Cigarettes  . Smokeless tobacco: Never Used  Substance Use Topics  . Alcohol use: Yes    Comment: 36+ beers daily  . Drug  use: Not Currently    Frequency: 3.0 times per week    Types: Cocaine    Review of Systems  Constitutional: No fever/chills Eyes: No visual changes. ENT: No sore throat. Cardiovascular: Denies chest pain. Respiratory: Denies shortness of breath. Gastrointestinal: No abdominal pain.  No nausea, no vomiting.  No diarrhea.  No constipation. Genitourinary:  Negative for dysuria. Musculoskeletal: Negative for back pain. Skin: Negative for rash. Neurological: Negative for headaches, focal weakness or numbness. Psychiatric: Positive suicidal and homicidal thoughts.   10-point ROS otherwise negative.  ____________________________________________   PHYSICAL EXAM:  VITAL SIGNS: Today's Vitals   08/20/18 1826 08/20/18 2053 08/21/18 0001 08/21/18 0002  BP:   117/80 117/80  Pulse:   84 84  Resp:   18   Temp:   98.2 F (36.8 C)   TempSrc:   Oral   SpO2:   99%   Weight: 68 kg     Height: 6\' 2"  (1.88 m)     PainSc:  0-No pain     Body mass index is 19.25 kg/m.  Constitutional: Disheveled.  Appears intoxicated. Eyes: Conjunctivae are normal. PERRL Head: Atraumatic. Nose: No congestion/rhinnorhea. Mouth/Throat: Mucous membranes are moist.  Neck: No stridor.  Cardiovascular: Normal rate, regular rhythm. Good peripheral circulation. Grossly normal heart sounds.   Respiratory: Normal respiratory effort.  No retractions. Lungs CTAB. Gastrointestinal: Soft and nontender. No distention.  Musculoskeletal: No lower extremity tenderness nor edema. No gross deformities of extremities. Neurologic:  Normal speech and language. No gross focal neurologic deficits are appreciated.  Skin:  Skin is warm, dry and intact. No rash noted. Psychiatric: Mood and affect are flat. Speech and behavior are normal.  ____________________________________________   LABS (all labs ordered are listed, but only abnormal results are displayed)  Labs Reviewed  COMPREHENSIVE METABOLIC PANEL - Abnormal; Notable for the following components:      Result Value   Glucose, Bld 113 (*)    BUN <5 (*)    Calcium 8.8 (*)    Total Protein 8.2 (*)    Albumin 3.3 (*)    AST 107 (*)    Alkaline Phosphatase 161 (*)    All other components within normal limits  ETHANOL - Abnormal; Notable for the following components:   Alcohol, Ethyl (B) 402 (*)    All other components  within normal limits  CBC - Abnormal; Notable for the following components:   RBC 3.56 (*)    Hemoglobin 9.5 (*)    HCT 30.7 (*)    RDW 23.9 (*)    Platelets 124 (*)    All other components within normal limits  ACETAMINOPHEN LEVEL - Abnormal; Notable for the following components:   Acetaminophen (Tylenol), Serum <10 (*)    All other components within normal limits  SARS CORONAVIRUS 2 (HOSPITAL ORDER, [place] [facility])  RAPID URINE DRUG SCREEN, HOSP PERFORMED  SALICYLATE LEVEL   ____________________________________________  RADIOLOGY  None  ____________________________________________   PROCEDURES  Procedure(s) performed:   Procedures  None  ____________________________________________   INITIAL IMPRESSION / ASSESSMENT AND PLAN / ED COURSE  Pertinent labs & imaging results that were available during my care of the patient were reviewed by me and considered in my medical decision making (see chart for details).   Patient presents to the emergency department for evaluation of suicidal and homicidal ideation.  He reports a vague plan of having a knife and supposedly hurting either himself or other people with it.  Initially, patient arrived  fairly intoxicated.  I reassessed the patient after some sobering and he continues to endorse suicidal ideation.  Patient is in the emergency department frequently for this.  Last evaluated by behavioral health on 5/29.  Suspect an element of malingering here but given that he has clinically sobered and continues to endorse suicidal and homicidal ideation I will asked that TTS evaluate him again.   Labs reviewed. Patient is medically clear.  ____________________________________________  FINAL CLINICAL IMPRESSION(S) / ED DIAGNOSES  Final diagnoses:  Suicidal ideation  Homicidal ideation    Note:  This document was prepared using Dragon voice recognition software and may include unintentional dictation errors.   Nanda Quinton, MD Emergency Medicine    , [facility], MD 08/21/18 [phone]

## 2018-08-20 NOTE — ED Notes (Signed)
Pt reports that he is SI, wants to cut himself when he finds his knife, HI to anyone that walks in his path with no plan, denies AVH

## 2018-08-20 NOTE — ED Triage Notes (Signed)
Pt BIB [facility] for feeling suicidal and homicidal. EMS reports the pt is highly intoxicated and homeless. EMS reports pt's vital signs are stable and he is cooperative.   Pt states he is feeling suicidal and homicidal for the past 3 hours. Pt endorses consuming ETOH this date but states "Not enough" and as he usually consumes 36+ beers a day. Pt reports he has a plan to kill himself by jumping in front of traffic or using his own knife to stab himself in his heart.

## 2018-08-21 DIAGNOSIS — K922 Gastrointestinal hemorrhage, unspecified: Secondary | ICD-10-CM

## 2018-08-21 HISTORY — DX: Gastrointestinal hemorrhage, unspecified: K92.2

## 2018-08-21 LAB — ETHANOL: Alcohol, Ethyl (B): 186 mg/dL — ABNORMAL HIGH (ref <10)

## 2018-08-21 NOTE — ED Notes (Signed)
Pt ambulated in hallway without difficulty, tolerating fluids

## 2018-08-21 NOTE — BHH Counselor (Signed)
Per chart pt's BAL was 402 at 1815, pt is not medically cleared. Clinician to assess pt once medically cleared. Discussed with Janett Billow, RN to call once pt's BAL is around 250.   Vertell Novak, [place], [facility], [facility] Triage Specialist [phone]

## 2018-08-21 NOTE — BHH Counselor (Signed)
Pt denies, family, friend supports for clinician to call to gather collateral information.  Vertell Novak, [place], [facility], [facility] Triage Specialist [phone]

## 2018-08-21 NOTE — ED Notes (Signed)
Breakfast ordered 

## 2018-08-21 NOTE — BHH Counselor (Signed)
Clinician received a call from [place] from [facility] expressing the pt needs to be on his seizure medication due to him having an alcohol related seizure 6 months ago, has to contract for safety and if he drinks he has to blow less than. 02. Per Lovey Newcomer, at [facility] the pt could come this morning around 1000 for an appointment.   Clinician noted the pt is currently discharged from [facility].    Vertell Novak, [place], [facility], [facility] Triage Specialist [phone]
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
## 2018-08-21 NOTE — BHH Counselor (Signed)
Clinician looked in chart pt's redrawn BAL is pending. As a new BAL was requested by provider, then disposition would be provided.    Vertell Novak, [place], [facility], [facility] Triage Specialist [phone]

## 2018-08-21 NOTE — Discharge Instructions (Addendum)
[facility] [phone])    [facility] [phone])    Both of these facilities currently have open beds for alcohol detox.  You may contact them after 8 AM today.

## 2018-08-21 NOTE — ED Provider Notes (Signed)
6:40 AM  Patient is a 53 year old male with history of alcohol abuse and chronic suicidal ideation who is well-known to our emergency department who presented overnight with alcohol intoxication and SI.  Patient now clinically sober.  Work-up is unremarkable.  TTS has been consulted and recommend outpatient [facility] which [facility] beds.  Patient has been able to eat and drink here without difficulty and has been able to ambulate.  Will discharge with outpatient resources.  At this time, I do not feel there is any life-threatening condition present. I have reviewed and discussed all results (EKG, imaging, lab, urine as appropriate) and exam findings with patient/family. I have reviewed nursing notes and appropriate previous records.  I feel the patient is safe to be discharged home without further emergent workup and can continue workup as an outpatient as needed. Discussed usual and customary return precautions. Patient/family verbalize understanding and are comfortable with this plan.  Outpatient follow-up has been provided as needed. All questions have been answered.    Ward, Delice Bison, DO 08/21/18 [phone]

## 2018-08-21 NOTE — BHH Counselor (Addendum)
Clinician was present with Darlyne Russian, PA while he reassessed the pt. Pt denies, current SI and reported, his SI was alcohol induced. Darlyne Russian, PA recommends discharge and to follow up with substance use treatment. Pt was agreeable to go to substance use treatment.   Clinician contacted [facility] [phone]) and noted they have an open male bed. Clinician faxed over the requested documentation. Sandy at [facility] reported, she will call back with an appointment time.   Clinician contacted [facility] [phone]) and noted they have open male beds. It is recommended the pt call back after 0800.   Clinician called social work at [facility] and left a message to follow up with pt.   Vertell Novak, [place], [facility], [facility] Triage Specialist [phone]

## 2018-08-25 ENCOUNTER — Encounter (HOSPITAL_COMMUNITY): Payer: Self-pay | Admitting: Emergency Medicine

## 2018-08-25 ENCOUNTER — Emergency Department (HOSPITAL_COMMUNITY)
Admission: EM | Admit: 2018-08-25 | Discharge: 2018-08-26 | Disposition: A | Discharge status: Home / Self Care | Payer: Self-pay | Attending: Emergency Medicine | Admitting: Emergency Medicine

## 2018-08-25 DIAGNOSIS — F17210 Nicotine dependence, cigarettes, uncomplicated: Secondary | ICD-10-CM | POA: Insufficient documentation

## 2018-08-25 DIAGNOSIS — F10920 Alcohol use, unspecified with intoxication, uncomplicated: Secondary | ICD-10-CM

## 2018-08-25 DIAGNOSIS — Z79899 Other long term (current) drug therapy: Secondary | ICD-10-CM | POA: Insufficient documentation

## 2018-08-25 DIAGNOSIS — I10 Essential (primary) hypertension: Secondary | ICD-10-CM | POA: Insufficient documentation

## 2018-08-25 LAB — CBC WITH DIFFERENTIAL/PLATELET
Abs Immature Granulocytes: 0.01 10*3/uL (ref 0.00–0.07)
Basophils Absolute: 0 10*3/uL (ref 0.0–0.1)
Basophils Relative: 1 %
Eosinophils Absolute: 0.1 10*3/uL (ref 0.0–0.5)
Eosinophils Relative: 2 %
HCT: 28.1 % — ABNORMAL LOW (ref 39.0–52.0)
Hemoglobin: 8.7 g/dL — ABNORMAL LOW (ref 13.0–17.0)
Immature Granulocytes: 0 %
Lymphocytes Relative: 35 %
Lymphs Abs: 1.1 10*3/uL (ref 0.7–4.0)
MCH: 26.8 pg (ref 26.0–34.0)
MCHC: 31 g/dL (ref 30.0–36.0)
MCV: 86.5 fL (ref 80.0–100.0)
Monocytes Absolute: 0.6 10*3/uL (ref 0.1–1.0)
Monocytes Relative: 17 %
Neutro Abs: 1.4 10*3/uL — ABNORMAL LOW (ref 1.7–7.7)
Neutrophils Relative %: 45 %
Platelets: 73 10*3/uL — ABNORMAL LOW (ref 150–400)
RBC: 3.25 MIL/uL — ABNORMAL LOW (ref 4.22–5.81)
RDW: 22.8 % — ABNORMAL HIGH (ref 11.5–15.5)
WBC: 3.3 10*3/uL — ABNORMAL LOW (ref 4.0–10.5)
nRBC: 0 % (ref 0.0–0.2)

## 2018-08-25 LAB — COMPREHENSIVE METABOLIC PANEL
ALT: 23 U/L (ref 0–44)
AST: 92 U/L — ABNORMAL HIGH (ref 15–41)
Albumin: 3.5 g/dL (ref 3.5–5.0)
Alkaline Phosphatase: 143 U/L — ABNORMAL HIGH (ref 38–126)
Anion gap: 9 (ref 5–15)
BUN: 6 mg/dL (ref 6–20)
CO2: 24 mmol/L (ref 22–32)
Calcium: 8.4 mg/dL — ABNORMAL LOW (ref 8.9–10.3)
Chloride: 107 mmol/L (ref 98–111)
Creatinine, Ser: 0.57 mg/dL — ABNORMAL LOW (ref 0.61–1.24)
GFR calc Af Amer: >60 mL/min (ref >60)
GFR calc non Af Amer: >60 mL/min (ref >60)
Glucose, Bld: 94 mg/dL (ref 70–99)
Potassium: 3.6 mmol/L (ref 3.5–5.1)
Sodium: 140 mmol/L (ref 135–145)
Total Bilirubin: 0.7 mg/dL (ref 0.3–1.2)
Total Protein: 8.3 g/dL — ABNORMAL HIGH (ref 6.5–8.1)

## 2018-08-25 LAB — CBG MONITORING, ED: Glucose-Capillary: 93 mg/dL (ref 70–99)

## 2018-08-25 LAB — ETHANOL: Alcohol, Ethyl (B): 360 mg/dL (ref <10)

## 2018-08-25 NOTE — ED Triage Notes (Signed)
Pt comes to ed via ems, c/o coughing and vomiting dark brown, possible blood. Pt is admits to drinking lots today, and was in the heat and feels weak.  Pt was a local gas station and called ems, V/s on arrival hr 90 reg, spo2 99 room air, bp 120/84, cbg 113. Significant etoh.  Alert x 4.

## 2018-08-25 NOTE — ED Notes (Signed)
Date and time results received: 08/25/18 2211 (use smartphrase ".now" to insert current time)  Test: ETOH Critical Value: 360  Name of Provider Notified: Zenia Resides, A  Orders Received? Or Actions Taken?: monitor

## 2018-08-25 NOTE — ED Provider Notes (Signed)
[facility] DEPT Provider Note   CSN: [id number] Arrival date & time: 08/25/18  2010    History   Chief Complaint Chief Complaint  Patient presents with  . Emesis    w cough   . Alcohol Intoxication    HPI Barry Moore is a 53 y.o. male.     Patient to ED by EMS. He states he vomited x 2 today and there was blood in his emesis. Patient with a history of alcoholism with previous GI bleeding. He reports pain in his abdomen and chest "that is always there". He denies new pain. He reports having loose stools today, unknown if the stool is black. No other complaints.   The history is provided by the patient. No language interpreter was used.  Emesis  Alcohol Intoxication     Past Medical History:  Diagnosis Date  . Alcoholic hepatitis   . Depression   . ETOH abuse   . Hypertension   . Pancreatitis   . Seizures ([place])   . Suicidal behavior     Patient Active Problem List   Diagnosis Date Noted  . Colon ulcer   . Polyp of ascending colon   . Acute GI bleeding 08/05/2018  . Transaminitis 08/05/2018  . Abdominal pain 08/05/2018  . Nausea and vomiting 08/05/2018  . Substance induced mood disorder ([place]) 07/16/2017  . Severe recurrent major depression without psychotic features ([place]) 05/15/2017  . Anemia 03/18/2017  . Acute blood loss anemia 02/21/2017  . UGI bleed 02/08/2017  . Alcohol withdrawal ([place]) 02/08/2017  . Cocaine abuse ([place]) 11/18/2016  . GI bleed 11/01/2016  . Alcohol abuse   . Major depressive disorder, recurrent severe without psychotic features ([place]) 07/06/2016  . 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 Seizure ([place]) 05/26/2016  . Tobacco abuse 05/26/2016  . Homeless 05/26/2016  . Essential hypertension 05/26/2016  . Alcohol dependence with alcohol-induced mood disorder ([place]) 02/14/2016  . Severe alcohol withdrawal without perceptual disturbances without complication ([place]) 48/54/6270  . Alcoholic hepatitis  without ascites 02/14/2016  . Thrombocytopenia ([place]) 02/14/2016    Past Surgical History:  Procedure Laterality Date  . BIOPSY  08/05/2018   Procedure: BIOPSY;  Surgeon: Irving Copas., MD;  Location: [place];  Service: Gastroenterology;;  . BIOPSY  08/07/2018   Procedure: BIOPSY;  Surgeon: [place] Park, MD;  Location: [place];  Service: Gastroenterology;;  . COLONOSCOPY WITH PROPOFOL N/A 08/07/2018   Procedure: COLONOSCOPY WITH PROPOFOL;  Surgeon: [place] Park, MD;  Location: [place];  Service: Gastroenterology;  Laterality: N/A;  . ESOPHAGOGASTRODUODENOSCOPY (EGD) WITH PROPOFOL N/A 11/02/2016   Procedure: ESOPHAGOGASTRODUODENOSCOPY (EGD) WITH PROPOFOL;  Surgeon: Carol Ada, MD;  Location: [facility];  Service: Endoscopy;  Laterality: N/A;  . ESOPHAGOGASTRODUODENOSCOPY (EGD) WITH PROPOFOL N/A 08/05/2018   Procedure: ESOPHAGOGASTRODUODENOSCOPY (EGD) WITH PROPOFOL;  Surgeon: Rush Landmark Telford Nab., MD;  Location: [place];  Service: Gastroenterology;  Laterality: N/A;  . HERNIA REPAIR    . LEG SURGERY    . POLYPECTOMY  08/07/2018   Procedure: POLYPECTOMY;  Surgeon: Thornton Park, MD;  Location: [facility] ENDOSCOPY;  Service: Gastroenterology;;        Home Medications    Prior to Admission medications   Medication Sig Start Date End Date Taking? Authorizing Provider  ferrous sulfate 325 (65 FE) MG tablet Take 1 tablet (325 mg total) by mouth daily with breakfast. 08/09/18 08/09/19  Elodia Florence., MD  folic acid (FOLVITE) 1 MG tablet Take 1 tablet (1 mg total) by mouth  daily. 08/09/18   Elodia Florence., MD  Multiple Vitamin (MULTIVITAMIN WITH MINERALS) TABS tablet Take 1 tablet by mouth daily. 08/09/18   Elodia Florence., MD  pantoprazole (PROTONIX) 40 MG tablet Take 1 tablet (40 mg total) by mouth 2 (two) times daily. 08/09/18   Elodia Florence., MD  sucralfate (CARAFATE) 1 GM/10ML suspension Take 10 mLs (1 g total) by mouth 4  (four) times daily -  with meals and at bedtime. Patient not taking: Reported on 08/04/2018 03/26/18   Fatima Blank, MD  thiamine 100 MG tablet Take 1 tablet (100 mg total) by mouth daily. 08/09/18   Elodia Florence., MD    Family History Family History  Problem Relation Age of Onset  . Diabetes Mother   . Alcoholism Mother   . Emphysema Father   . Lung cancer Father   . Alcoholism Father     Social History Social History   Tobacco Use  . Smoking status: Current Every Day Smoker    Packs/day: 1.00    Types: Cigarettes  . Smokeless tobacco: Never Used  Substance Use Topics  . Alcohol use: Yes    Comment: 36+ beers daily  . Drug use: Not Currently    Frequency: 3.0 times per week    Types: Cocaine     Allergies   Tomato and Aspirin   Review of Systems Review of Systems  Unable to perform ROS: Other (Significant alcohol intoxication)  Gastrointestinal: Positive for vomiting.     Physical Exam Updated Vital Signs BP 112/64   Pulse 88   Temp 98.6 F (37 C) (Oral)   Resp (!) 21   Ht 5\' 9"  (1.753 m)   Wt 65.8 kg   SpO2 96%   BMI 21.41 kg/m   Physical Exam Vitals signs and nursing note reviewed.  Constitutional:      General: He is not in acute distress.    Comments: Acutely intoxicated  HENT:     Head: Atraumatic.     Comments: Patient has a full mustache and beard that is free from emesis or blood. Neck:     Musculoskeletal: Normal range of motion.  Cardiovascular:     Rate and Rhythm: Normal rate and regular rhythm.     Heart sounds: No murmur.  Pulmonary:     Effort: Pulmonary effort is normal.     Breath sounds: No wheezing, rhonchi or rales.  Abdominal:     General: There is no distension.     Palpations: Abdomen is soft.     Comments: Diffuse abdominal tenderness.  Musculoskeletal: Normal range of motion.  Skin:    General: Skin is warm and dry.      ED Treatments / Results  Labs (all labs ordered are listed, but only  abnormal results are displayed) Labs Reviewed  COMPREHENSIVE METABOLIC PANEL - Abnormal; Notable for the following components:      Result Value   Creatinine, Ser 0.57 (*)    Calcium 8.4 (*)    Total Protein 8.3 (*)    AST 92 (*)    Alkaline Phosphatase 143 (*)    All other components within normal limits  ETHANOL - Abnormal; Notable for the following components:   Alcohol, Ethyl (B) 360 (*)    All other components within normal limits  CBC WITH DIFFERENTIAL/PLATELET - Abnormal; Notable for the following components:   WBC 3.3 (*)    RBC 3.25 (*)    Hemoglobin 8.7 (*)  HCT 28.1 (*)    RDW 22.8 (*)    Platelets 73 (*)    Neutro Abs 1.4 (*)    All other components within normal limits  CBG MONITORING, ED    EKG None  Radiology No results found.  Procedures Procedures (including critical care time)  Medications Ordered in ED Medications - No data to display   Initial Impression / Assessment and Plan / ED Course  I have reviewed the triage vital signs and the nursing notes.  Pertinent labs & imaging results that were available during my care of the patient were reviewed by me and considered in my medical decision making (see chart for details).        Patient well known to the ED for alcohol intoxication and chronic SI, previous GI bleeding and alcoholic gastritis. He comes in after reportedly calling EMS from gas station for being in the heat and feeling weak, and for vomiting x 2 that was bloody.   The patient is significant intoxicated. No emesis since arrival. VSS. Hgb 8.7, baseline appears to be around 9.5.   Will observe for now. He is hemodynamically stable, no hypotension, no active emesis and in NAD.   12:00 - no vomiting in ED. Resting/sleeping.  1:45 - no vomiting. No hypotension. Resting/sleeping  2:50 - no vomiting. Feel if there was ongoing GI bleeding, the patient would continue to have pain and/or vomiting. He has been sleeping soundly, appears  comfortable. VSS.  At this point, will wait until he is clinically sober for safe discharge.   6:30 - patient is awake, ambulates with steady gait to the bathroom. He is considered clinically sober and stable for discharge home.   Final Clinical Impressions(s) / ED Diagnoses   Final diagnoses:  None   1. Alcohol intoxication  ED Discharge Orders    None       Charlann Lange, Hershal Coria 08/26/18 [id number]    Quintella Reichert, MD 08/26/18 [phone]

## 2018-08-25 NOTE — ED Notes (Signed)
Bed: WA17 Expected date:  Expected time:  Means of arrival:  Comments: Hemoptysis

## 2018-08-26 MED ORDER — CLOTRIMAZOLE 1 % VA CREA: 1.0000 | TOPICAL_CREAM | Freq: Once | VAGINAL | Status: DC

## 2018-08-26 MED ORDER — FLUCONAZOLE 150 MG PO TABS: 150.0000 mg | ORAL_TABLET | Freq: Once | ORAL | Status: DC

## 2018-08-26 NOTE — ED Notes (Signed)
Pt awake able to hold down water.

## 2018-08-29 ENCOUNTER — Encounter (HOSPITAL_COMMUNITY): Payer: Self-pay | Admitting: Emergency Medicine

## 2018-08-29 ENCOUNTER — Other Ambulatory Visit: Payer: Self-pay

## 2018-08-29 ENCOUNTER — Emergency Department (HOSPITAL_COMMUNITY)
Admission: EM | Admit: 2018-08-29 | Discharge: 2018-08-30 | Disposition: A | Discharge status: Home / Self Care | Payer: Self-pay | Attending: Emergency Medicine | Admitting: Emergency Medicine

## 2018-08-29 ENCOUNTER — Emergency Department (HOSPITAL_COMMUNITY): Payer: Self-pay

## 2018-08-29 DIAGNOSIS — I10 Essential (primary) hypertension: Secondary | ICD-10-CM | POA: Insufficient documentation

## 2018-08-29 DIAGNOSIS — R296 Repeated falls: Secondary | ICD-10-CM | POA: Insufficient documentation

## 2018-08-29 DIAGNOSIS — Y939 Activity, unspecified: Secondary | ICD-10-CM | POA: Insufficient documentation

## 2018-08-29 DIAGNOSIS — F10920 Alcohol use, unspecified with intoxication, uncomplicated: Secondary | ICD-10-CM | POA: Insufficient documentation

## 2018-08-29 DIAGNOSIS — M545 Low back pain: Secondary | ICD-10-CM | POA: Insufficient documentation

## 2018-08-29 DIAGNOSIS — W19XXXA Unspecified fall, initial encounter: Secondary | ICD-10-CM | POA: Insufficient documentation

## 2018-08-29 DIAGNOSIS — Y999 Unspecified external cause status: Secondary | ICD-10-CM | POA: Insufficient documentation

## 2018-08-29 DIAGNOSIS — F17210 Nicotine dependence, cigarettes, uncomplicated: Secondary | ICD-10-CM | POA: Insufficient documentation

## 2018-08-29 DIAGNOSIS — Z79899 Other long term (current) drug therapy: Secondary | ICD-10-CM | POA: Insufficient documentation

## 2018-08-29 DIAGNOSIS — R109 Unspecified abdominal pain: Secondary | ICD-10-CM | POA: Insufficient documentation

## 2018-08-29 DIAGNOSIS — Y908 Blood alcohol level of 240 mg/100 ml or more: Secondary | ICD-10-CM | POA: Insufficient documentation

## 2018-08-29 DIAGNOSIS — S20419A Abrasion of unspecified back wall of thorax, initial encounter: Secondary | ICD-10-CM | POA: Insufficient documentation

## 2018-08-29 DIAGNOSIS — Y929 Unspecified place or not applicable: Secondary | ICD-10-CM | POA: Insufficient documentation

## 2018-08-29 NOTE — ED Provider Notes (Signed)
[facility] EMERGENCY DEPARTMENT Provider Note   CSN: [id number] Arrival date & time: 08/29/18  2109    History   Chief Complaint Chief Complaint  Patient presents with  . multiple falls  . Alcohol Intoxication    HPI Barry Moore is a 53 y.o. male.     HPI Patient presents to the emergency room for evaluation of pain after a fall.  Patient has a history of alcohol abuse.  He admits to drinking today.  He had a fall.  Patient states he is not sure why he fell.  He states he hit his head may have lost consciousness.  He is having pain in his back and chest.  He denies any abdominal pain or shortness of breath.  No vomiting or diarrhea. Past Medical History:  Diagnosis Date  . Alcoholic hepatitis   . Depression   . ETOH abuse   . Hypertension   . Pancreatitis   . Seizures ([place])   . Suicidal behavior     Patient Active Problem List   Diagnosis Date Noted  . Colon ulcer   . Polyp of ascending colon   . Acute GI bleeding 08/05/2018  . Transaminitis 08/05/2018  . Abdominal pain 08/05/2018  . Nausea and vomiting 08/05/2018  . Substance induced mood disorder ([place]) 07/16/2017  . Severe recurrent major depression without psychotic features ([place]) 05/15/2017  . Anemia 03/18/2017  . Acute blood loss anemia 02/21/2017  . UGI bleed 02/08/2017  . Alcohol withdrawal ([place]) 02/08/2017  . Cocaine abuse ([place]) 11/18/2016  . GI bleed 11/01/2016  . Alcohol abuse   . 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 Seizure ([place]) 05/26/2016  . Tobacco abuse 05/26/2016  . Homeless 05/26/2016  . Essential hypertension 05/26/2016  . Alcohol dependence with alcohol-induced mood disorder ([place]) 02/14/2016  . Severe alcohol withdrawal without perceptual disturbances without complication ([place]) 84/13/2440  . Alcoholic hepatitis without ascites 02/14/2016  . Thrombocytopenia ([place])  02/14/2016    Past Surgical History:  Procedure Laterality Date  . BIOPSY  08/05/2018   Procedure: BIOPSY;  Surgeon: Irving Copas., MD;  Location: [facility];  Service: Gastroenterology;;  . BIOPSY  08/07/2018   Procedure: BIOPSY;  Surgeon: Thornton Park, MD;  Location: [facility];  Service: Gastroenterology;;  . COLONOSCOPY WITH PROPOFOL N/A 08/07/2018   Procedure: COLONOSCOPY WITH PROPOFOL;  Surgeon: Thornton Park, MD;  Location: [facility];  Service: Gastroenterology;  Laterality: N/A;  . ESOPHAGOGASTRODUODENOSCOPY (EGD) WITH PROPOFOL N/A 11/02/2016   Procedure: ESOPHAGOGASTRODUODENOSCOPY (EGD) WITH PROPOFOL;  Surgeon: Carol Ada, MD;  Location: [facility];  Service: Endoscopy;  Laterality: N/A;  . ESOPHAGOGASTRODUODENOSCOPY (EGD) WITH PROPOFOL N/A 08/05/2018   Procedure: ESOPHAGOGASTRODUODENOSCOPY (EGD) WITH PROPOFOL;  Surgeon: Rush Landmark Telford Nab., MD;  Location: [facility];  Service: Gastroenterology;  Laterality: N/A;  . HERNIA REPAIR    . LEG SURGERY    . POLYPECTOMY  08/07/2018   Procedure: POLYPECTOMY;  Surgeon: Thornton Park, MD;  Location: [facility] ENDOSCOPY;  Service: Gastroenterology;;        Home Medications    Prior to Admission medications   Medication Sig Start Date End Date Taking? Authorizing Provider  ferrous sulfate 325 (65 FE) MG tablet Take 1 tablet (325 mg total) by mouth daily with breakfast. 08/09/18 08/09/19  Elodia Florence., MD  folic acid (FOLVITE) 1 MG tablet Take 1 tablet (1 mg total) by mouth daily. 08/09/18   Elodia Florence.,  MD  Multiple Vitamin (MULTIVITAMIN WITH MINERALS) TABS tablet Take 1 tablet by mouth daily. 08/09/18   Elodia Florence., MD  pantoprazole (PROTONIX) 40 MG tablet Take 1 tablet (40 mg total) by mouth 2 (two) times daily. 08/09/18   Elodia Florence., MD  sucralfate (CARAFATE) 1 GM/10ML suspension Take 10 mLs (1 g total) by mouth 4 (four) times daily -  with meals and at bedtime.  Patient not taking: Reported on 08/04/2018 03/26/18   Fatima Blank, MD  thiamine 100 MG tablet Take 1 tablet (100 mg total) by mouth daily. 08/09/18   Elodia Florence., MD    Family History Family History  Problem Relation Age of Onset  . Diabetes Mother   . Alcoholism Mother   . Emphysema Father   . Lung cancer Father   . Alcoholism Father     Social History Social History   Tobacco Use  . Smoking status: Current Every Day Smoker    Packs/day: 1.00    Types: Cigarettes  . Smokeless tobacco: Never Used  Substance Use Topics  . Alcohol use: Yes    Comment: 36+ beers daily  . Drug use: Not Currently    Frequency: 3.0 times per week    Types: Cocaine     Allergies   Tomato and Aspirin   Review of Systems Review of Systems  All other systems reviewed and are negative.    Physical Exam Updated Vital Signs BP (!) 141/80 (BP Location: Right Arm)   Pulse 86   Temp 99.2 F (37.3 C) (Oral)   Resp (!) 21   Ht 1.753 m (5\' 9" )   Wt 65.8 kg   SpO2 96%   BMI 21.41 kg/m   Physical Exam Vitals signs and nursing note reviewed.  Constitutional:      General: He is not in acute distress.    Appearance: He is well-developed.  HENT:     Head: Normocephalic and atraumatic.     Right Ear: External ear normal.     Left Ear: External ear normal.  Eyes:     General: No scleral icterus.       Right eye: No discharge.        Left eye: No discharge.     Conjunctiva/sclera: Conjunctivae normal.  Neck:     Musculoskeletal: Neck supple.     Trachea: No tracheal deviation.  Cardiovascular:     Rate and Rhythm: Normal rate and regular rhythm.  Pulmonary:     Effort: Pulmonary effort is normal. No respiratory distress.     Breath sounds: Normal breath sounds. No stridor. No wheezing or rales.     Comments: Slight abrasion noted posterior chest wall Abdominal:     General: Bowel sounds are normal. There is no distension.     Palpations: Abdomen is soft.      Tenderness: There is no abdominal tenderness. There is no guarding or rebound.  Musculoskeletal:     Right hip: Normal.     Left hip: Normal.     Cervical back: He exhibits no tenderness.     Thoracic back: He exhibits tenderness.     Lumbar back: He exhibits tenderness.  Skin:    General: Skin is warm and dry.     Findings: No rash.  Neurological:     Mental Status: He is alert.     Cranial Nerves: No cranial nerve deficit (no facial droop, extraocular movements intact, speech slurred).  Sensory: No sensory deficit.     Motor: No abnormal muscle tone or seizure activity.     Coordination: Coordination normal.      ED Treatments / Results  Labs (all labs ordered are listed, but only abnormal results are displayed) Labs Reviewed  CBC - Abnormal; Notable for the following components:      Result Value   WBC 3.9 (*)    RBC 3.40 (*)    Hemoglobin 9.1 (*)    HCT 28.7 (*)    RDW 21.6 (*)    Platelets 65 (*)    All other components within normal limits  BASIC METABOLIC PANEL - Abnormal; Notable for the following components:   Potassium 3.4 (*)    CO2 21 (*)    Glucose, Bld 101 (*)    BUN <5 (*)    Calcium 8.6 (*)    All other components within normal limits  ETHANOL - Abnormal; Notable for the following components:   Alcohol, Ethyl (B) 319 (*)    All other components within normal limits    EKG None  Radiology No results found.  Procedures Procedures (including critical care time)  Medications Ordered in ED Medications - No data to display   Initial Impression / Assessment and Plan / ED Course  I have reviewed the triage vital signs and the nursing notes.  Pertinent labs & imaging results that were available during my care of the patient were reviewed by me and considered in my medical decision making (see chart for details).   Pt presented to the ED with complaints of alcohol intoxication and a fall.  Labs, IV fluids, xrays ordered.  Care turned over to Dr  Stark Jock at change of shift to follow up on labs, dispo  Final Clinical Impressions(s) / ED Diagnoses  pending   Dorie Rank, MD 08/31/18 [phone]

## 2018-08-29 NOTE — ED Triage Notes (Signed)
Pt presents to ED by EMS c/o multiple falls today ETOH on board. Landed on concrete, +LOC. Pt thiks he drank 5 40 oz beers. Pt also c/o chronic chest pain and back pain. No new pain. Pt also wishing to be dead. Has a plan to cut himself with knife. Says he tried 10 years ago.

## 2018-08-30 ENCOUNTER — Other Ambulatory Visit: Payer: Self-pay

## 2018-08-30 ENCOUNTER — Inpatient Hospital Stay (HOSPITAL_COMMUNITY)
Admission: EM | Admit: 2018-08-30 | Discharge: 2018-09-01 | DRG: 378 | Disposition: A | Discharge status: Home / Self Care | Payer: Self-pay | Attending: Family Medicine | Admitting: Family Medicine

## 2018-08-30 ENCOUNTER — Encounter (HOSPITAL_COMMUNITY): Payer: Self-pay

## 2018-08-30 DIAGNOSIS — Z5900 Homelessness unspecified: Secondary | ICD-10-CM

## 2018-08-30 DIAGNOSIS — D649 Anemia, unspecified: Secondary | ICD-10-CM | POA: Diagnosis present

## 2018-08-30 DIAGNOSIS — K921 Melena: Principal | ICD-10-CM | POA: Diagnosis present

## 2018-08-30 DIAGNOSIS — D6959 Other secondary thrombocytopenia: Secondary | ICD-10-CM | POA: Diagnosis present

## 2018-08-30 DIAGNOSIS — F17210 Nicotine dependence, cigarettes, uncomplicated: Secondary | ICD-10-CM | POA: Diagnosis present

## 2018-08-30 DIAGNOSIS — G8929 Other chronic pain: Secondary | ICD-10-CM | POA: Diagnosis present

## 2018-08-30 DIAGNOSIS — D509 Iron deficiency anemia, unspecified: Secondary | ICD-10-CM | POA: Diagnosis present

## 2018-08-30 DIAGNOSIS — I10 Essential (primary) hypertension: Secondary | ICD-10-CM | POA: Diagnosis present

## 2018-08-30 DIAGNOSIS — K7010 Alcoholic hepatitis without ascites: Secondary | ICD-10-CM | POA: Diagnosis present

## 2018-08-30 DIAGNOSIS — D696 Thrombocytopenia, unspecified: Secondary | ICD-10-CM | POA: Diagnosis present

## 2018-08-30 DIAGNOSIS — F1020 Alcohol dependence, uncomplicated: Secondary | ICD-10-CM | POA: Diagnosis present

## 2018-08-30 DIAGNOSIS — D62 Acute posthemorrhagic anemia: Secondary | ICD-10-CM | POA: Diagnosis present

## 2018-08-30 DIAGNOSIS — F1010 Alcohol abuse, uncomplicated: Secondary | ICD-10-CM | POA: Diagnosis present

## 2018-08-30 DIAGNOSIS — Z9114 Patient's other noncompliance with medication regimen: Secondary | ICD-10-CM

## 2018-08-30 DIAGNOSIS — K922 Gastrointestinal hemorrhage, unspecified: Secondary | ICD-10-CM

## 2018-08-30 DIAGNOSIS — Z1159 Encounter for screening for other viral diseases: Secondary | ICD-10-CM

## 2018-08-30 DIAGNOSIS — F329 Major depressive disorder, single episode, unspecified: Secondary | ICD-10-CM | POA: Diagnosis present

## 2018-08-30 LAB — COMPREHENSIVE METABOLIC PANEL
ALT: 26 U/L (ref 0–44)
AST: 93 U/L — ABNORMAL HIGH (ref 15–41)
Albumin: 3.4 g/dL — ABNORMAL LOW (ref 3.5–5.0)
Alkaline Phosphatase: 169 U/L — ABNORMAL HIGH (ref 38–126)
Anion gap: 11 (ref 5–15)
BUN: <5 mg/dL — ABNORMAL LOW (ref 6–20)
CO2: 22 mmol/L (ref 22–32)
Calcium: 8.8 mg/dL — ABNORMAL LOW (ref 8.9–10.3)
Chloride: 105 mmol/L (ref 98–111)
Creatinine, Ser: 0.61 mg/dL (ref 0.61–1.24)
GFR calc Af Amer: >60 mL/min (ref >60)
GFR calc non Af Amer: >60 mL/min (ref >60)
Glucose, Bld: 102 mg/dL — ABNORMAL HIGH (ref 70–99)
Potassium: 3.6 mmol/L (ref 3.5–5.1)
Sodium: 138 mmol/L (ref 135–145)
Total Bilirubin: 1 mg/dL (ref 0.3–1.2)
Total Protein: 8.1 g/dL (ref 6.5–8.1)

## 2018-08-30 LAB — CBC
HCT: 28.7 % — ABNORMAL LOW (ref 39.0–52.0)
HCT: 29.7 % — ABNORMAL LOW (ref 39.0–52.0)
Hemoglobin: 9.1 g/dL — ABNORMAL LOW (ref 13.0–17.0)
Hemoglobin: 9.4 g/dL — ABNORMAL LOW (ref 13.0–17.0)
MCH: 26.8 pg (ref 26.0–34.0)
MCH: 26.9 pg (ref 26.0–34.0)
MCHC: 31.7 g/dL (ref 30.0–36.0)
MCHC: 31.6 g/dL (ref 30.0–36.0)
MCV: 84.4 fL (ref 80.0–100.0)
MCV: 84.9 fL (ref 80.0–100.0)
Platelets: 65 10*3/uL — ABNORMAL LOW (ref 150–400)
Platelets: 73 10*3/uL — ABNORMAL LOW (ref 150–400)
RBC: 3.4 MIL/uL — ABNORMAL LOW (ref 4.22–5.81)
RBC: 3.5 MIL/uL — ABNORMAL LOW (ref 4.22–5.81)
RDW: 21.6 % — ABNORMAL HIGH (ref 11.5–15.5)
RDW: 21.5 % — ABNORMAL HIGH (ref 11.5–15.5)
WBC: 3.9 10*3/uL — ABNORMAL LOW (ref 4.0–10.5)
WBC: 4.7 10*3/uL (ref 4.0–10.5)
nRBC: 0 % (ref 0.0–0.2)
nRBC: 0 % (ref 0.0–0.2)

## 2018-08-30 LAB — BASIC METABOLIC PANEL
Anion gap: 11 (ref 5–15)
BUN: <5 mg/dL — ABNORMAL LOW (ref 6–20)
CO2: 21 mmol/L — ABNORMAL LOW (ref 22–32)
Calcium: 8.6 mg/dL — ABNORMAL LOW (ref 8.9–10.3)
Chloride: 105 mmol/L (ref 98–111)
Creatinine, Ser: 0.69 mg/dL (ref 0.61–1.24)
GFR calc Af Amer: >60 mL/min (ref >60)
GFR calc non Af Amer: >60 mL/min (ref >60)
Glucose, Bld: 101 mg/dL — ABNORMAL HIGH (ref 70–99)
Potassium: 3.4 mmol/L — ABNORMAL LOW (ref 3.5–5.1)
Sodium: 137 mmol/L (ref 135–145)

## 2018-08-30 LAB — TYPE AND SCREEN
ABO/RH(D): O POS
Antibody Screen: NEGATIVE

## 2018-08-30 LAB — ETHANOL: Alcohol, Ethyl (B): 319 mg/dL (ref <10)

## 2018-08-30 NOTE — ED Notes (Signed)
Discharge instructions discussed with pt. Pt verbalized understanding. Pt stable and ambulatory. No signature pad available. 

## 2018-08-30 NOTE — ED Triage Notes (Signed)
Pt reports chronic GI bleed, seen here yesterday for the same. +ETOH today. C.o abd pain

## 2018-08-30 NOTE — ED Notes (Addendum)
Pt awake, talking normally, eating sandwich and drinking ginger ale. Tolerating well.

## 2018-08-30 NOTE — Discharge Instructions (Addendum)
Follow-up with your primary doctor for any additional problems.

## 2018-08-31 ENCOUNTER — Encounter (HOSPITAL_COMMUNITY): Payer: Self-pay | Admitting: General Practice

## 2018-08-31 DIAGNOSIS — D696 Thrombocytopenia, unspecified: Secondary | ICD-10-CM

## 2018-08-31 DIAGNOSIS — D62 Acute posthemorrhagic anemia: Secondary | ICD-10-CM

## 2018-08-31 DIAGNOSIS — Z590 Homelessness: Secondary | ICD-10-CM

## 2018-08-31 DIAGNOSIS — K7030 Alcoholic cirrhosis of liver without ascites: Secondary | ICD-10-CM

## 2018-08-31 DIAGNOSIS — K922 Gastrointestinal hemorrhage, unspecified: Secondary | ICD-10-CM

## 2018-08-31 DIAGNOSIS — F1010 Alcohol abuse, uncomplicated: Secondary | ICD-10-CM

## 2018-08-31 DIAGNOSIS — K921 Melena: Principal | ICD-10-CM

## 2018-08-31 LAB — CBC
HCT: 27.8 % — ABNORMAL LOW (ref 39.0–52.0)
Hemoglobin: 8.7 g/dL — ABNORMAL LOW (ref 13.0–17.0)
MCH: 27 pg (ref 26.0–34.0)
MCHC: 31.3 g/dL (ref 30.0–36.0)
MCV: 86.3 fL (ref 80.0–100.0)
Platelets: 58 10*3/uL — ABNORMAL LOW (ref 150–400)
RBC: 3.22 MIL/uL — ABNORMAL LOW (ref 4.22–5.81)
RDW: 21.4 % — ABNORMAL HIGH (ref 11.5–15.5)
WBC: 3.3 10*3/uL — ABNORMAL LOW (ref 4.0–10.5)
nRBC: 0 % (ref 0.0–0.2)

## 2018-08-31 LAB — BASIC METABOLIC PANEL
Anion gap: 10 (ref 5–15)
BUN: <5 mg/dL — ABNORMAL LOW (ref 6–20)
CO2: 22 mmol/L (ref 22–32)
Calcium: 8.2 mg/dL — ABNORMAL LOW (ref 8.9–10.3)
Chloride: 108 mmol/L (ref 98–111)
Creatinine, Ser: 0.58 mg/dL — ABNORMAL LOW (ref 0.61–1.24)
GFR calc Af Amer: >60 mL/min (ref >60)
GFR calc non Af Amer: >60 mL/min (ref >60)
Glucose, Bld: 79 mg/dL (ref 70–99)
Potassium: 3.4 mmol/L — ABNORMAL LOW (ref 3.5–5.1)
Sodium: 140 mmol/L (ref 135–145)

## 2018-08-31 LAB — PROTIME-INR
INR: 1.1 (ref 0.8–1.2)
Prothrombin Time: 14.5 seconds (ref 11.4–15.2)

## 2018-08-31 LAB — HIV ANTIBODY (ROUTINE TESTING W REFLEX): HIV Screen 4th Generation wRfx: NONREACTIVE

## 2018-08-31 LAB — POC OCCULT BLOOD, ED: Fecal Occult Bld: POSITIVE — AB

## 2018-08-31 LAB — ETHANOL: Alcohol, Ethyl (B): 207 mg/dL — ABNORMAL HIGH (ref <10)

## 2018-08-31 LAB — OCCULT BLOOD X 1 CARD TO LAB, STOOL: Fecal Occult Bld: POSITIVE — AB

## 2018-08-31 MED ORDER — SODIUM CHLORIDE 0.9 % IV SOLN
INTRAVENOUS | Status: DC
  Administered 2018-08-31: 08:00:00 via INTRAVENOUS
  Administered 2018-08-31: 01:00:00 125 mL/h via INTRAVENOUS

## 2018-08-31 MED ORDER — PANTOPRAZOLE SODIUM 40 MG IV SOLR
40.0000 mg | Freq: Two times a day (BID) | INTRAVENOUS | Status: DC
  Administered 2018-08-31: 40 mg via INTRAVENOUS
  Filled 2018-08-31: qty 40

## 2018-08-31 MED ORDER — FOLIC ACID 1 MG PO TABS
1.0000 mg | ORAL_TABLET | Freq: Every day | ORAL | Status: DC
  Administered 2018-08-31 – 2018-09-01 (×2): 1 mg via ORAL
  Filled 2018-08-31 (×2): qty 1

## 2018-08-31 MED ORDER — ONDANSETRON HCL 4 MG PO TABS: 4.0000 mg | ORAL_TABLET | Freq: Four times a day (QID) | ORAL | Status: DC | PRN

## 2018-08-31 MED ORDER — ONDANSETRON HCL 4 MG/2ML IJ SOLN: 4.0000 mg | Freq: Four times a day (QID) | INTRAMUSCULAR | Status: DC | PRN

## 2018-08-31 MED ORDER — THIAMINE HCL 100 MG/ML IJ SOLN: 100.0000 mg | Freq: Every day | INTRAMUSCULAR | Status: DC

## 2018-08-31 MED ORDER — ADULT MULTIVITAMIN W/MINERALS CH
1.0000 | ORAL_TABLET | Freq: Every day | ORAL | Status: DC
  Administered 2018-08-31 – 2018-09-01 (×2): 1 via ORAL
  Filled 2018-08-31 (×2): qty 1

## 2018-08-31 MED ORDER — SODIUM CHLORIDE 0.9 % IV SOLN
8.0000 mg/h | INTRAVENOUS | Status: DC
  Administered 2018-08-31: 04:00:00 8 mg/h via INTRAVENOUS
  Filled 2018-08-31: qty 80

## 2018-08-31 MED ORDER — ACETAMINOPHEN 325 MG PO TABS: 650.0000 mg | ORAL_TABLET | Freq: Four times a day (QID) | ORAL | Status: DC | PRN

## 2018-08-31 MED ORDER — SODIUM CHLORIDE 0.9 % IV SOLN
510.0000 mg | Freq: Once | INTRAVENOUS | Status: AC
  Administered 2018-08-31: 13:00:00 510 mg via INTRAVENOUS
  Filled 2018-08-31: qty 17

## 2018-08-31 MED ORDER — PANTOPRAZOLE SODIUM 40 MG IV SOLR: 40.0000 mg | INTRAVENOUS | Status: DC

## 2018-08-31 MED ORDER — LORAZEPAM 1 MG PO TABS: 1.0000 mg | ORAL_TABLET | Freq: Four times a day (QID) | ORAL | Status: DC | PRN

## 2018-08-31 MED ORDER — ACETAMINOPHEN 650 MG RE SUPP: 650.0000 mg | Freq: Four times a day (QID) | RECTAL | Status: DC | PRN

## 2018-08-31 MED ORDER — SUCRALFATE 1 GM/10ML PO SUSP
1.0000 g | Freq: Three times a day (TID) | ORAL | Status: DC
  Administered 2018-08-31: 1 g via ORAL
  Filled 2018-08-31: qty 10

## 2018-08-31 MED ORDER — LORAZEPAM 2 MG/ML IJ SOLN: 1.0000 mg | Freq: Four times a day (QID) | INTRAMUSCULAR | Status: DC | PRN

## 2018-08-31 MED ORDER — PANTOPRAZOLE SODIUM 40 MG IV SOLR
40.0000 mg | Freq: Once | INTRAVENOUS | Status: DC
  Filled 2018-08-31: qty 40

## 2018-08-31 MED ORDER — VITAMIN B-1 100 MG PO TABS
100.0000 mg | ORAL_TABLET | Freq: Every day | ORAL | Status: DC
  Administered 2018-08-31 – 2018-09-01 (×2): 100 mg via ORAL
  Filled 2018-08-31 (×2): qty 1

## 2018-08-31 MED ORDER — PANTOPRAZOLE SODIUM 40 MG IV SOLR
40.0000 mg | Freq: Two times a day (BID) | INTRAVENOUS | Status: DC
  Administered 2018-08-31: 01:00:00 40 mg via INTRAVENOUS

## 2018-08-31 MED ORDER — SODIUM CHLORIDE 0.9 % IV SOLN
80.0000 mg | Freq: Once | INTRAVENOUS | Status: AC
  Administered 2018-08-31: 80 mg via INTRAVENOUS
  Filled 2018-08-31: qty 80

## 2018-08-31 MED ORDER — FLUCONAZOLE 200 MG PO TABS
200.0000 mg | ORAL_TABLET | Freq: Every day | ORAL | Status: DC
  Administered 2018-08-31 – 2018-09-01 (×2): 200 mg via ORAL
  Filled 2018-08-31 (×2): qty 1
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
## 2018-08-31 NOTE — ED Provider Notes (Signed)
Care assumed from previous provider.  Patient presenting here complaining of abdominal pain and alcohol intoxication.  Patient has history of recent similar visits.  Patient allowed to sober up here in the ER.  He now appears back to his baseline and appropriate for discharge.   Veryl Speak, MD 08/31/18 [phone]

## 2018-08-31 NOTE — H&P (Signed)
History and Physical    Barry Moore LPF:[id number] DOB: 14-Apr-1965 DOA: 08/30/2018  PCP: Patient, No Pcp Per  Patient coming from: Homeless  I have personally briefly reviewed patient's old medical records in [place]  Chief Complaint: GIB  HPI: Barry Moore is a 53 y.o. male with medical history significant of EtOH abuse, homelessness, chronic abd pain.  Patient recently admitted to hospital earlier last month with GIB.  EGD and colonoscopy performed.  EGD revealed nummular lesions throughout the entire esophagus, grade 1 small esophageal varices in the distal esophagus esophagitis, 1 superficial duodenal ulcer, signs of Barrett's esophagus, portal hypertensive gastropathy, erosive gastropathy.  Colonoscopy revealed internal and external hemorrhoids, 2 polyps in the ascending colon and cecum, 3 mm ulcer in the proximal ascending colon that was not bleeding.  Patient continues to drink alcohol.  Not taking any home meds at all (including PPI, carafate).  No anticoagulation   ED Course: HGB 9.4 up from 9.1 yesterday and 8.7 on 6/5.  Patient with gross melena on exam today per EDP.   Review of Systems: As per HPI otherwise 10 point review of systems negative.   Past Medical History:  Diagnosis Date   Alcoholic hepatitis    Depression    ETOH abuse    Hypertension    Pancreatitis    Seizures ([place])    Suicidal behavior     Past Surgical History:  Procedure Laterality Date   BIOPSY  08/05/2018   Procedure: BIOPSY;  Surgeon: Rush Landmark Telford Nab., MD;  Location: [facility] ENDOSCOPY;  Service: Gastroenterology;;   BIOPSY  08/07/2018   Procedure: BIOPSY;  Surgeon: Thornton Park, MD;  Location: [place];  Service: Gastroenterology;;   COLONOSCOPY WITH PROPOFOL N/A 08/07/2018   Procedure: COLONOSCOPY WITH PROPOFOL;  Surgeon: Thornton Park, MD;  Location: [place];  Service: Gastroenterology;  Laterality: N/A;   ESOPHAGOGASTRODUODENOSCOPY (EGD) WITH  PROPOFOL N/A 11/02/2016   Procedure: ESOPHAGOGASTRODUODENOSCOPY (EGD) WITH PROPOFOL;  Surgeon: Carol Ada, MD;  Location: [facility] ENDOSCOPY;  Service: Endoscopy;  Laterality: N/A;   ESOPHAGOGASTRODUODENOSCOPY (EGD) WITH PROPOFOL N/A 08/05/2018   Procedure: ESOPHAGOGASTRODUODENOSCOPY (EGD) WITH PROPOFOL;  Surgeon: Rush Landmark Telford Nab., MD;  Location: [place];  Service: Gastroenterology;  Laterality: N/A;   HERNIA REPAIR     LEG SURGERY     POLYPECTOMY  08/07/2018   Procedure: POLYPECTOMY;  Surgeon: Thornton Park, MD;  Location: [place];  Service: Gastroenterology;;     reports that he has been smoking cigarettes. He has been smoking about 1.00 pack per day. He has never used smokeless tobacco. He reports current alcohol use. He reports previous drug use. Frequency: 3.00 times per week. Drug: Cocaine.  Allergies  Allergen Reactions   Tomato Shortness Of Breath and Nausea And Vomiting   Aspirin Nausea And Vomiting    Family History  Problem Relation Age of Onset   Diabetes Mother    Alcoholism Mother    Emphysema Father    Lung cancer Father    Alcoholism Father      Prior to Admission medications   Medication Sig Start Date End Date Taking? Authorizing Provider  ferrous sulfate 325 (65 FE) MG tablet Take 1 tablet (325 mg total) by mouth daily with breakfast. 08/09/18 08/09/19  Elodia Florence., MD  folic acid (FOLVITE) 1 MG tablet Take 1 tablet (1 mg total) by mouth daily. 08/09/18   Elodia Florence., MD  Multiple Vitamin (MULTIVITAMIN WITH MINERALS) TABS tablet Take 1 tablet by mouth daily. 08/09/18  Elodia Florence., MD  pantoprazole (PROTONIX) 40 MG tablet Take 1 tablet (40 mg total) by mouth 2 (two) times daily. 08/09/18   Elodia Florence., MD  sucralfate (CARAFATE) 1 GM/10ML suspension Take 10 mLs (1 g total) by mouth 4 (four) times daily -  with meals and at bedtime. Patient not taking: Reported on 08/04/2018 03/26/18   Fatima Blank, MD  thiamine 100 MG tablet Take 1 tablet (100 mg total) by mouth daily. 08/09/18   Elodia Florence., MD    Physical Exam: Vitals:   08/30/18 1842 08/30/18 2056  BP: 127/71 123/78  Pulse: 86 96  Resp: 18 18  Temp: 98.6 F (37 C)   TempSrc: Oral   SpO2: 97% 99%    Constitutional: NAD, calm, comfortable Eyes: PERRL, lids and conjunctivae normal ENMT: Mucous membranes are moist. Posterior pharynx clear of any exudate or lesions.Normal dentition.  Neck: normal, supple, no masses, no thyromegaly Respiratory: clear to auscultation bilaterally, no wheezing, no crackles. Normal respiratory effort. No accessory muscle use.  Cardiovascular: Regular rate and rhythm, no murmurs / rubs / gallops. No extremity edema. 2+ pedal pulses. No carotid bruits.  Abdomen: no tenderness, no masses palpated. No hepatosplenomegaly. Bowel sounds positive.  Musculoskeletal: no clubbing / cyanosis. No joint deformity upper and lower extremities. Good ROM, no contractures. Normal muscle tone.  Skin: no rashes, lesions, ulcers. No induration Neurologic: CN 2-12 grossly intact. Sensation intact, DTR normal. Strength 5/5 in all 4.  Psychiatric: Normal judgment and insight. Alert and oriented x 3. Normal mood.    Labs on Admission: I have personally reviewed following labs and imaging studies  CBC: Recent Labs  Lab 08/25/18 2116 08/29/18 2314 08/30/18 1848  WBC 3.3* 3.9* 4.7  NEUTROABS 1.4*  --   --   HGB 8.7* 9.1* 9.4*  HCT 28.1* 28.7* 29.7*  MCV 86.5 84.4 84.9  PLT 73* 65* 73*   Basic Metabolic Panel: Recent Labs  Lab 08/25/18 2116 08/29/18 2314 08/30/18 1848  NA 140 137 138  K 3.6 3.4* 3.6  CL 107 105 105  CO2 24 21* 22  GLUCOSE 94 101* 102*  BUN 6 <5* <5*  CREATININE 0.57* 0.69 0.61  CALCIUM 8.4* 8.6* 8.8*   GFR: Estimated Creatinine Clearance: 99.4 mL/min (by C-G formula based on SCr of 0.61 mg/dL). Liver Function Tests: Recent Labs  Lab 08/25/18 2116 08/30/18 1848    AST 92* 93*  ALT 23 26  ALKPHOS 143* 169*  BILITOT 0.7 1.0  PROT 8.3* 8.1  ALBUMIN 3.5 3.4*   No results for input(s): LIPASE, AMYLASE in the last 168 hours. No results for input(s): AMMONIA in the last 168 hours. Coagulation Profile: No results for input(s): INR, PROTIME in the last 168 hours. Cardiac Enzymes: No results for input(s): CKTOTAL, CKMB, CKMBINDEX, TROPONINI in the last 168 hours. BNP (last 3 results) No results for input(s): PROBNP in the last 8760 hours. HbA1C: No results for input(s): HGBA1C in the last 72 hours. CBG: Recent Labs  Lab 08/25/18 2023  GLUCAP 93   Lipid Profile: No results for input(s): CHOL, HDL, LDLCALC, TRIG, CHOLHDL, LDLDIRECT in the last 72 hours. Thyroid Function Tests: No results for input(s): TSH, T4TOTAL, FREET4, T3FREE, THYROIDAB in the last 72 hours. Anemia Panel: No results for input(s): VITAMINB12, FOLATE, FERRITIN, TIBC, IRON, RETICCTPCT in the last 72 hours. Urine analysis:    Component Value Date/Time   COLORURINE YELLOW 08/05/2018 2014   APPEARANCEUR CLEAR 08/05/2018 2014  LABSPEC 1.012 08/05/2018 2014   PHURINE 7.0 08/05/2018 2014   [place] 08/05/2018 2014   [place] 08/05/2018 2014   [place] 08/05/2018 2014   [place] 08/05/2018 2014   PROTEINUR NEGATIVE 08/05/2018 2014   NITRITE NEGATIVE 08/05/2018 2014   LEUKOCYTESUR NEGATIVE 08/05/2018 2014    Radiological Exams on Admission: Dg Chest 2 View  Result Date: 08/29/2018 CLINICAL DATA:  53 year old male with falls on concrete. Loss of consciousness. Pain. EXAM: CHEST - 2 VIEW COMPARISON:  08/13/2018 and earlier. FINDINGS: Upright AP and lateral views. Lung volumes and mediastinal contours remain normal. Visualized tracheal air column is within normal limits. Bulky chronic deformity of the medial left clavicle redemonstrated. Both lungs appear clear. No pneumothorax or pleural effusion. No acute osseous abnormality identified.  Negative visible bowel gas pattern. IMPRESSION: 1. No cardiopulmonary abnormality. 2. Bulky chronic deformity of the medial left clavicle. Electronically Signed   By: Genevie Ann M.D.   On: 08/29/2018 22:56   Dg Lumbar Spine Complete  Result Date: 08/29/2018 CLINICAL DATA:  53 year old male with multiple falls landing on concrete. Pain. EXAM: LUMBAR SPINE - COMPLETE 4+ VIEW COMPARISON:  CT Abdomen and Pelvis 05/24/2018 FINDINGS: Normal lumbar segmentation. Stable lumbar lordosis. Chronic L5-S1 disc space loss and vacuum disc. No pars fracture. Visible lower thoracic levels appear intact. No acute osseous abnormality identified. Sacral ala and SI joints appear normal. Negative abdominal visceral contours. IMPRESSION: 1. No acute osseous abnormality identified in the lumbar spine. 2. Advanced chronic L5-S1 disc degeneration. Electronically Signed   By: Genevie Ann M.D.   On: 08/29/2018 22:58   Ct Head Wo Contrast  Result Date: 08/29/2018 CLINICAL DATA:  Pain status post multiple falls. EXAM: CT HEAD WITHOUT CONTRAST CT CERVICAL SPINE WITHOUT CONTRAST TECHNIQUE: Multidetector CT imaging of the head and cervical spine was performed following the standard protocol without intravenous contrast. Multiplanar CT image reconstructions of the cervical spine were also generated. COMPARISON:  08/13/2018 FINDINGS: CT HEAD FINDINGS Brain: No evidence of acute infarction, hemorrhage, hydrocephalus, extra-axial collection or mass lesion/mass effect. There is age related volume loss. Vascular: No hyperdense vessel or unexpected calcification. Skull: Normal. Negative for fracture or focal lesion. There may be some mild posterior scalp swelling on the right. Sinuses/Orbits: No acute finding. Other: None. CT CERVICAL SPINE FINDINGS Alignment: There is reversal of the normal cervical lordotic curvature. Skull base and vertebrae: No acute fracture. No primary bone lesion or focal pathologic process. Soft tissues and spinal canal: No  prevertebral fluid or swelling. No visible canal hematoma. Disc levels:  There is mild multilevel disc height loss. Upper chest: Negative. Other: There is an old fracture of the left clavicle. IMPRESSION: 1. No acute intracranial abnormality detected. 2. No acute cervical spine fracture. 3. Mild-to-moderate volume loss with microvascular ischemic changes bilaterally. Electronically Signed   By: Constance Holster M.D.   On: 08/29/2018 23:14   Ct Cervical Spine Wo Contrast  Result Date: 08/29/2018 CLINICAL DATA:  Pain status post multiple falls. EXAM: CT HEAD WITHOUT CONTRAST CT CERVICAL SPINE WITHOUT CONTRAST TECHNIQUE: Multidetector CT imaging of the head and cervical spine was performed following the standard protocol without intravenous contrast. Multiplanar CT image reconstructions of the cervical spine were also generated. COMPARISON:  08/13/2018 FINDINGS: CT HEAD FINDINGS Brain: No evidence of acute infarction, hemorrhage, hydrocephalus, extra-axial collection or mass lesion/mass effect. There is age related volume loss. Vascular: No hyperdense vessel or unexpected calcification. Skull: Normal. Negative for fracture or focal lesion. There may be some  mild posterior scalp swelling on the right. Sinuses/Orbits: No acute finding. Other: None. CT CERVICAL SPINE FINDINGS Alignment: There is reversal of the normal cervical lordotic curvature. Skull base and vertebrae: No acute fracture. No primary bone lesion or focal pathologic process. Soft tissues and spinal canal: No prevertebral fluid or swelling. No visible canal hematoma. Disc levels:  There is mild multilevel disc height loss. Upper chest: Negative. Other: There is an old fracture of the left clavicle. IMPRESSION: 1. No acute intracranial abnormality detected. 2. No acute cervical spine fracture. 3. Mild-to-moderate volume loss with microvascular ischemic changes bilaterally. Electronically Signed   By: Constance Holster M.D.   On: 08/29/2018 23:14     EKG: Independently reviewed.  Assessment/Plan Principal Problem:   Gastrointestinal hemorrhage with melena Active Problems:   Thrombocytopenia ([place])   Homeless   Alcohol abuse   Anemia    1. UGIB with melena - 1. NPO 2. PPI gtt 3. IVF: NS at 125 cc/hr 4. Tele monitor 5. Call GI in AM 2. Anemia - 1. Acute blood loss anemia 2. HGB actually up today from where it has been the past couple of weeks at 9.4 3. Repeat CBC in AM (4h from now) and transfuse if needed 3. EtOH abuse - 1. CIWA 2. SW consult 4. Thrombocytopenia - 1. Stable and chronic 2. Presumably from EtOH abuse  DVT prophylaxis: SCDs Code Status: Full Family Communication: No family in room Disposition Plan: TBD Consults called: None, call GI in AM Admission status: Place in 32   Reyansh Kushnir, [place] [facility]  How to contact the [facility] Attending or Consulting provider [place] or covering provider during after hours [place], for this patient?  1. Check the care team in [facility] and look for a) attending/consulting [facility] provider listed and b) the [facility] team listed 2. Log into [email or web address]  Amion Physician Scheduling and messaging for groups and whole hospitals  On call and physician scheduling software for group practices, residents, hospitalists and other medical providers for call, clinic, rotation and shift schedules. On[employer] is a hospital-wide system for scheduling doctors and paging doctors on call. EasyPlot is for scientific plotting and data analysis.  [email or web address]  and use [place]'s universal password to access. If you do not have the password, please contact the hospital operator.  3. Locate the [facility] provider you are looking for under [facility] and page to a number that you can be directly reached. 4. If you still have difficulty reaching the provider, please page the [facility] (Director on Call) for the Hospitalists listed on amion for assistance.  08/31/2018, 12:40 AM

## 2018-08-31 NOTE — ED Notes (Signed)
ED TO INPATIENT HANDOFF REPORT  ED Nurse Name and Phone #: [phone]  S Name/Age/Gender Barry Moore 53 y.o. male Room/Bed: 032C/032C  Code Status   Code Status: Full Code  Home/SNF/Other Dc home AO x 4   Triage Complete: Triage complete  Chief Complaint GI Bleed  Triage Note Pt reports chronic GI bleed, seen here yesterday for the same. +ETOH today. C.o abd pain   Allergies Allergies  Allergen Reactions  . Tomato Shortness Of Breath and Nausea And Vomiting  . Aspirin Nausea And Vomiting    Level of Care/Admitting Diagnosis ED Disposition    ED Disposition Condition [facility] Area: [place] [100100]  Level of Care: Telemetry Medical [104]  I expect the patient will be discharged within 24 hours: No (not a candidate for 5C-Observation unit)  Covid Evaluation: Screening Protocol (No Symptoms)  Diagnosis: Gastrointestinal hemorrhage with melena [2778242]  Admitting Physician: Etta Quill [[id number]]  Attending Physician: Etta Quill [[id number]]  PT Class (Do Not Modify): Observation [104]  PT Acc Code (Do Not Modify): Observation [10022]       B Medical/Surgery History Past Medical History:  Diagnosis Date  . Alcoholic hepatitis   . Depression   . ETOH abuse   . Hypertension   . Pancreatitis   . Seizures ([place])   . Suicidal behavior    Past Surgical History:  Procedure Laterality Date  . BIOPSY  08/05/2018   Procedure: BIOPSY;  Surgeon: Irving Copas., MD;  Location: [place];  Service: Gastroenterology;;  . BIOPSY  08/07/2018   Procedure: BIOPSY;  Surgeon: Thornton Park, MD;  Location: [place];  Service: Gastroenterology;;  . COLONOSCOPY WITH PROPOFOL N/A 08/07/2018   Procedure: COLONOSCOPY WITH PROPOFOL;  Surgeon: Thornton Park, MD;  Location: [place];  Service: Gastroenterology;  Laterality: N/A;  . ESOPHAGOGASTRODUODENOSCOPY (EGD) WITH PROPOFOL N/A 11/02/2016   Procedure:  ESOPHAGOGASTRODUODENOSCOPY (EGD) WITH PROPOFOL;  Surgeon: Carol Ada, MD;  Location: [facility];  Service: Endoscopy;  Laterality: N/A;  . ESOPHAGOGASTRODUODENOSCOPY (EGD) WITH PROPOFOL N/A 08/05/2018   Procedure: ESOPHAGOGASTRODUODENOSCOPY (EGD) WITH PROPOFOL;  Surgeon: Rush Landmark Telford Nab., MD;  Location: [place];  Service: Gastroenterology;  Laterality: N/A;  . HERNIA REPAIR    . LEG SURGERY    . POLYPECTOMY  08/07/2018   Procedure: POLYPECTOMY;  Surgeon: Thornton Park, MD;  Location: [place];  Service: Gastroenterology;;     A IV Location/Drains/Wounds Patient Lines/Drains/Airways Status   Active Line/Drains/Airways    Name:   Placement date:   Placement time:   Site:   Days:   Peripheral IV 08/31/18 Right Antecubital   08/31/18    0035    Antecubital   less than 1          Intake/Output Last 24 hours No intake or output data in the 24 hours ending 08/31/18 0338  Labs/Imaging Results for orders placed or performed during the hospital encounter of 08/30/18 (from the past 48 hour(s))  Comprehensive metabolic panel     Status: Abnormal   Collection Time: 08/30/18  6:48 PM  Result Value Ref Range   Sodium 138 135 - 145 mmol/L   Potassium 3.6 3.5 - 5.1 mmol/L   Chloride 105 98 - 111 mmol/L   CO2 22 22 - 32 mmol/L   Glucose, Bld 102 (H) 70 - 99 mg/dL   BUN <5 (L) 6 - 20 mg/dL   Creatinine, Ser 0.61 0.61 - 1.24 mg/dL   Calcium 8.8 (L) 8.9 -  10.3 mg/dL   Total Protein 8.1 6.5 - 8.1 g/dL   Albumin 3.4 (L) 3.5 - 5.0 g/dL   AST 93 (H) 15 - 41 U/L   ALT 26 0 - 44 U/L   Alkaline Phosphatase 169 (H) 38 - 126 U/L   Total Bilirubin 1.0 0.3 - 1.2 mg/dL   GFR calc non Af Amer >60 >60 mL/min   GFR calc Af Amer >60 >60 mL/min   Anion gap 11 5 - 15    Comment: Performed at [facility] [address] [place], [place] [address]  CBC     Status: Abnormal   Collection Time: 08/30/18  6:48 PM  Result Value Ref Range   WBC 4.7 4.0 - 10.5 K/uL   RBC 3.50 (L)  4.22 - 5.81 MIL/uL   Hemoglobin 9.4 (L) 13.0 - 17.0 g/dL   HCT 29.7 (L) 39.0 - 52.0 %   MCV 84.9 80.0 - 100.0 fL   MCH 26.9 26.0 - 34.0 pg   MCHC 31.6 30.0 - 36.0 g/dL   RDW 21.5 (H) 11.5 - 15.5 %   Platelets 73 (L) 150 - 400 K/uL    Comment: REPEATED TO VERIFY Immature Platelet Fraction may be clinically indicated, consider ordering this additional test [id number] CONSISTENT WITH PREVIOUS RESULT    nRBC 0.0 0.0 - 0.2 %    Comment: Performed at [facility], [place] [address], [place], [place] [address]  Type and screen [place]     Status: None   Collection Time: 08/30/18  6:48 PM  Result Value Ref Range   ABO/RH(D) O POS    Antibody Screen NEG    Sample Expiration      09/02/2018,2359 Performed at [facility], [place] [address], [place], [place] [address]   Occult blood card to lab, stool     Status: Abnormal   Collection Time: 08/31/18 12:09 AM  Result Value Ref Range   Fecal Occult Bld POSITIVE (A) NEGATIVE    Comment: Performed at [facility] [address], [place], [place] [address]  Ethanol     Status: Abnormal   Collection Time: 08/31/18 12:11 AM  Result Value Ref Range   Alcohol, Ethyl (B) 207 (H) <10 mg/dL    Comment: (NOTE) Lowest detectable limit for serum alcohol is 10 mg/dL. For medical purposes only. Performed at [facility], [place] [address] [place], [place] [address]   Protime-INR     Status: None   Collection Time: 08/31/18 12:11 AM  Result Value Ref Range   Prothrombin Time 14.5 11.4 - 15.2 seconds   INR 1.1 0.8 - 1.2    Comment: (NOTE) INR goal varies based on device and disease states. Performed at [facility], [place] [address], [place], [place] [address]   POC occult blood, ED     Status: Abnormal   Collection Time: 08/31/18 12:58 AM  Result Value Ref Range   Fecal Occult Bld POSITIVE (A) NEGATIVE   Dg Chest 2 View  Result Date: 08/29/2018 CLINICAL DATA:  54 year old male with falls on  concrete. Loss of consciousness. Pain. EXAM: CHEST - 2 VIEW COMPARISON:  08/13/2018 and earlier. FINDINGS: Upright AP and lateral views. Lung volumes and mediastinal contours remain normal. Visualized tracheal air column is within normal limits. Bulky chronic deformity of the medial left clavicle redemonstrated. Both lungs appear clear. No pneumothorax or pleural effusion. No acute osseous abnormality identified. Negative visible bowel gas  pattern. IMPRESSION: 1. No cardiopulmonary abnormality. 2. Bulky chronic deformity of the medial left clavicle. Electronically Signed   By: Genevie Ann M.D.   On: 08/29/2018 22:56   Dg Lumbar Spine Complete  Result Date: 08/29/2018 CLINICAL DATA:  54 year old male with multiple falls landing on concrete. Pain. EXAM: LUMBAR SPINE - COMPLETE 4+ VIEW COMPARISON:  CT Abdomen and Pelvis 05/24/2018 FINDINGS: Normal lumbar segmentation. Stable lumbar lordosis. Chronic L5-S1 disc space loss and vacuum disc. No pars fracture. Visible lower thoracic levels appear intact. No acute osseous abnormality identified. Sacral ala and SI joints appear normal. Negative abdominal visceral contours. IMPRESSION: 1. No acute osseous abnormality identified in the lumbar spine. 2. Advanced chronic L5-S1 disc degeneration. Electronically Signed   By: Genevie Ann M.D.   On: 08/29/2018 22:58   Ct Head Wo Contrast  Result Date: 08/29/2018 CLINICAL DATA:  Pain status post multiple falls. EXAM: CT HEAD WITHOUT CONTRAST CT CERVICAL SPINE WITHOUT CONTRAST TECHNIQUE: Multidetector CT imaging of the head and cervical spine was performed following the standard protocol without intravenous contrast. Multiplanar CT image reconstructions of the cervical spine were also generated. COMPARISON:  08/13/2018 FINDINGS: CT HEAD FINDINGS Brain: No evidence of acute infarction, hemorrhage, hydrocephalus, extra-axial collection or mass lesion/mass effect. There is age related volume loss. Vascular: No hyperdense vessel or  unexpected calcification. Skull: Normal. Negative for fracture or focal lesion. There may be some mild posterior scalp swelling on the right. Sinuses/Orbits: No acute finding. Other: None. CT CERVICAL SPINE FINDINGS Alignment: There is reversal of the normal cervical lordotic curvature. Skull base and vertebrae: No acute fracture. No primary bone lesion or focal pathologic process. Soft tissues and spinal canal: No prevertebral fluid or swelling. No visible canal hematoma. Disc levels:  There is mild multilevel disc height loss. Upper chest: Negative. Other: There is an old fracture of the left clavicle. IMPRESSION: 1. No acute intracranial abnormality detected. 2. No acute cervical spine fracture. 3. Mild-to-moderate volume loss with microvascular ischemic changes bilaterally. Electronically Signed   By: Constance Holster M.D.   On: 08/29/2018 23:14   Ct Cervical Spine Wo Contrast  Result Date: 08/29/2018 CLINICAL DATA:  Pain status post multiple falls. EXAM: CT HEAD WITHOUT CONTRAST CT CERVICAL SPINE WITHOUT CONTRAST TECHNIQUE: Multidetector CT imaging of the head and cervical spine was performed following the standard protocol without intravenous contrast. Multiplanar CT image reconstructions of the cervical spine were also generated. COMPARISON:  08/13/2018 FINDINGS: CT HEAD FINDINGS Brain: No evidence of acute infarction, hemorrhage, hydrocephalus, extra-axial collection or mass lesion/mass effect. There is age related volume loss. Vascular: No hyperdense vessel or unexpected calcification. Skull: Normal. Negative for fracture or focal lesion. There may be some mild posterior scalp swelling on the right. Sinuses/Orbits: No acute finding. Other: None. CT CERVICAL SPINE FINDINGS Alignment: There is reversal of the normal cervical lordotic curvature. Skull base and vertebrae: No acute fracture. No primary bone lesion or focal pathologic process. Soft tissues and spinal canal: No prevertebral fluid or swelling.  No visible canal hematoma. Disc levels:  There is mild multilevel disc height loss. Upper chest: Negative. Other: There is an old fracture of the left clavicle. IMPRESSION: 1. No acute intracranial abnormality detected. 2. No acute cervical spine fracture. 3. Mild-to-moderate volume loss with microvascular ischemic changes bilaterally. Electronically Signed   By: Constance Holster M.D.   On: 08/29/2018 23:14    Pending Labs Unresulted Labs (From admission, onward)    Start     Ordered   08/31/18 0500  CBC  Tomorrow morning,   R     08/31/18 0039   08/31/18 8309  Basic metabolic panel  Tomorrow morning,   R     08/31/18 0039   08/31/18 0037  HIV antibody (Routine Testing)  Once,   STAT     08/31/18 0039   08/31/18 0025  Novel Coronavirus,NAA,(SEND-OUT TO REF LAB - TAT 24-48 hrs); Hosp Order  (Asymptomatic Patients Labs)  ONCE - STAT,   STAT    Question:  Rule Out  Answer:  Yes   08/31/18 0024          Vitals/Pain Today's Vitals   08/30/18 1840 08/30/18 1842 08/30/18 2056 08/31/18 0000  BP:  127/71 123/78 120/75  Pulse:  86 96 96  Resp:  18 18 18   Temp:  98.6 F (37 C)  98.5 F (36.9 C)  TempSrc:  Oral    SpO2:  97% 99% 99%  PainSc: 8        Isolation Precautions No active isolations  Medications Medications  pantoprazole (PROTONIX) 80 mg in sodium chloride 0.9 % 100 mL IVPB (80 mg Intravenous New Bag/Given 08/31/18 0324)  pantoprazole (PROTONIX) 80 mg in sodium chloride 0.9 % 250 mL (0.32 mg/mL) infusion (has no administration in time range)  pantoprazole (PROTONIX) injection 40 mg (40 mg Intravenous Given 08/31/18 0030)  acetaminophen (TYLENOL) tablet 650 mg (has no administration in time range)    Or  acetaminophen (TYLENOL) suppository 650 mg (has no administration in time range)  ondansetron (ZOFRAN) tablet 4 mg (has no administration in time range)    Or  ondansetron (ZOFRAN) injection 4 mg (has no administration in time range)  0.9 %  sodium chloride infusion (125  mL/hr Intravenous New Bag/Given 08/31/18 0055)  LORazepam (ATIVAN) tablet 1 mg (has no administration in time range)    Or  LORazepam (ATIVAN) injection 1 mg (has no administration in time range)  thiamine (VITAMIN B-1) tablet 100 mg (has no administration in time range)    Or  thiamine (B-1) injection 100 mg (has no administration in time range)  folic acid (FOLVITE) tablet 1 mg (has no administration in time range)  multivitamin with minerals tablet 1 tablet (has no administration in time range)  sucralfate (CARAFATE) 1 GM/10ML suspension 1 g (has no administration in time range)    Mobility self High fall risk   Focused Assessments Chronic GI bleed, homeless, AO x 4 admitted on Protonix gtt  R Recommendations: See Admitting Provider Note  Report given to:   Additional Notes:

## 2018-08-31 NOTE — Progress Notes (Signed)
Patient seen and examined at bedside, patient admitted after midnight, please see earlier detailed admission note by Etta Quill, DO. Briefly, patient presented with melena and hematemesis. Recurrent issue secondary to ongoing alcohol intake. Had a long, sincere discussion with patient with regard to his actions and eventual consequences of his actions. He states he uses alcohol to treat depression. Discussed other ways to manage his depression. He has no insurance. Although he has received many resources in the past, will continue to offer and hopefully he will be able to follow through. With regard to GI bleeding, consulted [facility] since he recent had an EGD performed by them.    Cordelia Poche, MD [facility] 08/31/2018, 11:59 AM

## 2018-08-31 NOTE — Progress Notes (Signed)
Report received from ED nurse.

## 2018-08-31 NOTE — ED Provider Notes (Signed)
TIME SEEN: 12:11 AM  CHIEF COMPLAINT: GI bleed  HPI: Patient is a 53 year old male with history of alcohol abuse, hypertension who presents to the emergency department with complaints of chronic abdominal pain and 2 episodes of hematemesis and 1 week of dark stools.  Was recently admitted to the hospital on May 15 for the same.  Underwent EGD and colonoscopy at that time.  EGD revealed nummular lesions throughout the entire esophagus, grade 1 small esophageal varices in the distal esophagus esophagitis, 1 superficial duodenal ulcer, signs of Barrett's esophagus, portal hypertensive gastropathy, erosive gastropathy.  Colonoscopy revealed internal and external hemorrhoids, 2 polyps in the ascending colon and cecum, 3 mm ulcer in the proximal ascending colon that was not bleeding.  Patient continues to drink alcohol.  States he was drinking alcohol today.  No fevers or chills.  Not on anticoagulation.  ROS: See HPI Constitutional: no fever  Eyes: no drainage  ENT: no runny nose   Cardiovascular:  no chest pain  Resp: no SOB  GI:  vomiting GU: no dysuria Integumentary: no rash  Allergy: no hives  Musculoskeletal: no leg swelling  Neurological: no slurred speech ROS otherwise negative  PAST MEDICAL HISTORY/PAST SURGICAL HISTORY:  Past Medical History:  Diagnosis Date  . Alcoholic hepatitis   . Depression   . ETOH abuse   . Hypertension   . Pancreatitis   . Seizures ([place])   . Suicidal behavior     MEDICATIONS:  Prior to Admission medications   Medication Sig Start Date End Date Taking? Authorizing Provider  ferrous sulfate 325 (65 FE) MG tablet Take 1 tablet (325 mg total) by mouth daily with breakfast. 08/09/18 08/09/19  Elodia Florence., MD  folic acid (FOLVITE) 1 MG tablet Take 1 tablet (1 mg total) by mouth daily. 08/09/18   Elodia Florence., MD  Multiple Vitamin (MULTIVITAMIN WITH MINERALS) TABS tablet Take 1 tablet by mouth daily. 08/09/18   Elodia Florence., MD   pantoprazole (PROTONIX) 40 MG tablet Take 1 tablet (40 mg total) by mouth 2 (two) times daily. 08/09/18   Elodia Florence., MD  sucralfate (CARAFATE) 1 GM/10ML suspension Take 10 mLs (1 g total) by mouth 4 (four) times daily -  with meals and at bedtime. Patient not taking: Reported on 08/04/2018 03/26/18   Fatima Blank, MD  thiamine 100 MG tablet Take 1 tablet (100 mg total) by mouth daily. 08/09/18   Elodia Florence., MD    ALLERGIES:  Allergies  Allergen Reactions  . Tomato Shortness Of Breath and Nausea And Vomiting  . Aspirin Nausea And Vomiting    SOCIAL HISTORY:  Social History   Tobacco Use  . Smoking status: Current Every Day Smoker    Packs/day: 1.00    Types: Cigarettes  . Smokeless tobacco: Never Used  Substance Use Topics  . Alcohol use: Yes    Comment: 36+ beers daily    FAMILY HISTORY: Family History  Problem Relation Age of Onset  . Diabetes Mother   . Alcoholism Mother   . Emphysema Father   . Lung cancer Father   . Alcoholism Father     EXAM: BP 123/78 (BP Location: Left Arm)   Pulse 96   Temp 98.6 F (37 C) (Oral)   Resp 18   SpO2 99%  CONSTITUTIONAL: Alert and oriented and responds appropriately to questions.  Chronically ill-appearing HEAD: Normocephalic EYES: Conjunctivae clear, pupils appear equal, EOMI ENT: normal nose; moist mucous membranes  NECK: Supple, no meningismus, no nuchal rigidity, no LAD  CARD: RRR; S1 and S2 appreciated; no murmurs, no clicks, no rubs, no gallops RESP: Normal chest excursion without splinting or tachypnea; breath sounds clear and equal bilaterally; no wheezes, no rhonchi, no rales, no hypoxia or respiratory distress, speaking full sentences ABD/GI: Normal bowel sounds; non-distended; soft, tender to palpation in the right upper quadrant, no rebound, no guarding, no peritoneal signs RECTAL:  Normal rectal tone, no gross blood but patient has melena, guaiac positive, no hemorrhoids appreciated,  nontender rectal exam, no fecal impaction BACK:  The back appears normal and is non-tender to palpation, there is no CVA tenderness EXT: Normal ROM in all joints; non-tender to palpation; no edema; normal capillary refill; no cyanosis, no calf tenderness or swelling    SKIN: Normal color for age and race; warm; no rash NEURO: Moves all extremities equally PSYCH: The patient's mood and manner are appropriate. Grooming and personal hygiene are appropriate.  MEDICAL DECISION MAKING: Patient here with complaints of GI bleed.  Recently admitted for the same.  Found to have small esophageal varices, erosive gastropathy, Barrett's esophagus, duodenal and colon ulcers.  He is in no distress currently.  He is tender in the right upper quadrant but has chronic abdominal pain.  He is hemodynamically stable.  Labs show chronic anemia and thrombocytopenia which are stable.  Hemoccult is positive.  Will start PPI and admit to medicine.  ED PROGRESS: 12:35 AM Discussed patient's case with hospitalist, Dr. Alcario Drought.  I have recommended admission and patient (and family if present) agree with this plan. Admitting physician will place admission orders.   I reviewed all nursing notes, vitals, pertinent previous records, EKGs, lab and urine results, imaging (as available).     CRITICAL CARE Performed by: Pryor Curia   Total critical care time: 50 minutes  Critical care time was exclusive of separately billable procedures and treating other patients.  Critical care was necessary to treat or prevent imminent or life-threatening deterioration.  Critical care was time spent personally by me on the following activities: development of treatment plan with patient and/or surrogate as well as nursing, discussions with consultants, evaluation of patient's response to treatment, examination of patient, obtaining history from patient or surrogate, ordering and performing treatments and interventions, ordering and review  of laboratory studies, ordering and review of radiographic studies, pulse oximetry and re-evaluation of patient's condition.     , Delice Bison, DO 08/31/18 [phone]

## 2018-09-01 DIAGNOSIS — R195 Other fecal abnormalities: Secondary | ICD-10-CM

## 2018-09-01 LAB — CBC
HCT: 28.7 % — ABNORMAL LOW (ref 39.0–52.0)
Hemoglobin: 9.1 g/dL — ABNORMAL LOW (ref 13.0–17.0)
MCH: 26.6 pg (ref 26.0–34.0)
MCHC: 31.7 g/dL (ref 30.0–36.0)
MCV: 83.9 fL (ref 80.0–100.0)
Platelets: 51 10*3/uL — ABNORMAL LOW (ref 150–400)
RBC: 3.42 MIL/uL — ABNORMAL LOW (ref 4.22–5.81)
RDW: 20.9 % — ABNORMAL HIGH (ref 11.5–15.5)
WBC: 3.4 10*3/uL — ABNORMAL LOW (ref 4.0–10.5)
nRBC: 0 % (ref 0.0–0.2)

## 2018-09-01 LAB — NOVEL CORONAVIRUS, NAA (HOSP ORDER, SEND-OUT TO REF LAB; TAT 18-24 HRS): SARS-CoV-2, NAA: NOT DETECTED

## 2018-09-01 MED ORDER — PANTOPRAZOLE SODIUM 40 MG PO TBEC
40.0000 mg | DELAYED_RELEASE_TABLET | Freq: Two times a day (BID) | ORAL | Status: DC
  Administered 2018-09-01: 10:00:00 40 mg via ORAL
  Filled 2018-09-01: qty 1

## 2018-09-01 MED ORDER — FLUCONAZOLE 200 MG PO TABS: 200.0000 mg | ORAL_TABLET | Freq: Every day | ORAL | 0 refills | Status: AC

## 2018-09-01 MED FILL — FLUCONAZOLE 200 MG TABLET: 200 | 12 days supply | Qty: 12 | Fill #0

## 2018-09-01 NOTE — TOC Transition Note (Signed)
Transition of Care [facility]) - CM/SW Discharge Note   Patient Details  Name: Barry Moore MRN: [id number] Date of Birth: [birth year]  [facility] CM/SW Contact:  Bethena Roys, RN Phone Number: 09/01/2018, 11:48 AM   Clinical Narrative:  Pt presented for abdominal issues. Plan for home today. Patient is without PCP- appointment was scheduled at the [facility] and placed on AVS- Patient can utilize the [facility] for medications and they will range in cost from $4.00-$10.00. CM discussed with patient housing situation and patient states he is not homeless and lives in an apartment- declined resources. Patient states he is aware of ETOH resources and will not need. No further needs from CM at this time.      Final next level of care: Home/Self Care Barriers to Discharge: No Barriers Identified   Patient Goals and CMS Choice Patient states their goals for this hospitalization and ongoing recovery are:: "to go home"   Choice offered to / list presented to : NA  Discharge Placement                       Discharge Plan and Services In-house Referral: NA Discharge Planning Services: CM Consult            DME Arranged: N/A DME Agency: NA       HH Arranged: NA HH Agency: NA        Social Determinants of Health (SDOH) Interventions     Readmission Risk Interventions Readmission Risk Prevention Plan 09/01/2018 08/09/2018  Transportation Screening Complete Complete  PCP or Specialist Appt within 5-7 Days - Complete  Home Care Screening - Complete  Medication Review (RN CM) - Complete  Medication Review Press photographer) Complete -  PCP or Specialist appointment within 3-5 days of discharge Complete -  [place] or Home Care Consult Complete -  SW Recovery Care/Counseling Consult Complete -  Palliative Care Screening Not Applicable -  [place] Not Applicable -  Some recent data might be hidden

## 2018-09-01 NOTE — Progress Notes (Signed)
          Daily Rounding Note  09/01/2018, 8:20 AM  LOS: 1 day   SUBJECTIVE:   Chief complaint:  Hematemesis, dark/FOBT + stool.   Hungry, tolerated clears yest.  No n/v or abd pain.  No BMs  OBJECTIVE:         Vital signs in last 24 hours:    Temp:  [98.3 F (36.8 C)-98.6 F (37 C)] 98.3 F (36.8 C) (06/12 8309) Pulse Rate:  [64] 64 (06/11 2214) Resp:  [16] 16 (06/12 0633) BP: (134-139)/(77-93) 139/93 (06/12 0633) SpO2:  [99 %] 99 % (06/12 0633) Last BM Date: 08/30/18 Filed Weights   08/31/18 0440  Weight: 70.9 kg   General: looks comfortable, alert   Heart: RRR Chest: clear bil.  No sob or cough Abdomen: soft, NT, ND, active BS  Extremities: no CCE Neuro/Psych:  Oriented x 3.  Pleasant, fluid speech.  No tremor  Intake/Output from previous day: 06/11 0701 - 06/12 0700 In: 2155.2 [P.O.:240; I.V.:1798.2; IV Piggyback:117] Out: 1900 [Urine:1900]  Intake/Output this shift: No intake/output data recorded.  Lab Results: Recent Labs    08/30/18 1848 08/31/18 0335 09/01/18 0513  WBC 4.7 3.3* 3.4*  HGB 9.4* 8.7* 9.1*  HCT 29.7* 27.8* 28.7*  PLT 73* 58* 51*   BMET Recent Labs    08/29/18 2314 08/30/18 1848 08/31/18 0335  NA 137 138 140  K 3.4* 3.6 3.4*  CL 105 105 108  CO2 21* 22 22  GLUCOSE 101* 102* 79  BUN <5* <5* <5*  CREATININE 0.69 0.61 0.58*  CALCIUM 8.6* 8.8* 8.2*   LFT Recent Labs    08/30/18 1848  PROT 8.1  ALBUMIN 3.4*  AST 93*  ALT 26  ALKPHOS 169*  BILITOT 1.0   PT/INR Recent Labs    08/31/18 0011  LABPROT 14.5  INR 1.1   Scheduled Meds: . fluconazole  200 mg Oral Daily  . folic acid  1 mg Oral Daily  . multivitamin with minerals  1 tablet Oral Daily  . pantoprazole  40 mg Intravenous Q12H  . thiamine  100 mg Oral Daily   Or  . thiamine  100 mg Intravenous Daily   Continuous Infusions: PRN Meds:.acetaminophen **OR** acetaminophen, LORazepam **OR** LORazepam,  ondansetron **OR** ondansetron (ZOFRAN) IV   ASSESMENT:   *    Recurrent upper GI bleed manifesting as hematemesis, CGE, melena in patient with recent history of same.  Brown stool, FOBT + on DRE.   EGD 08/05/2018: small, nonbleeding esophageal varices, Candida esophagitis, esophageal erosion, erosive gastritis, portal hypertensive gastropathy, nonbleeding duodenal ulcer Non-compliant with meds, specifically BID Protonix, 4 times daily Carafate contd alcohol abuse.    *   Chronic anemia from blood loss, iron deficiency.  Not compliant with oral iron, did receive Feraheme infusion on 08/07/2018.  Last, and only record of blood transfusion was in 01/2017. Hgb stable.  Received Feraheme 5/18 6/12  *   Thrombocytopenia.   *    Chronic alcoholism.  *   Likely cirrhosis of the liver.  Normal PT/INR    PLAN   *  Change to oral PPI, complete Diflucan, oral iron 325/day.  ETOH abstinence.  Does not need Carafate Has ROV with Dr Jerilynn Mages 6/19  *   Advance diet. Ok to d/c home.     Barry Moore  09/01/2018, 8:20 AM Phone [phone]

## 2018-09-01 NOTE — Progress Notes (Signed)
Pt stable, TOC med was delievered to pt's room. Pt received a bus pass to get home. NT took pt out via wheelchair

## 2018-09-01 NOTE — Discharge Summary (Signed)
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 [facility] [address] [place], [place] [id number] Rush Farmer MD XY:[id number]    [place]  Final    Comment: Performed at [facility], [place] [address], [place], [place] [address]    SIGNED:   Cordelia Poche, MD [facility] 09/01/2018, 11:40 AM

## 2018-09-01 NOTE — Discharge Instructions (Signed)
Gastrointestinal Bleeding  Gastrointestinal (GI) bleeding is bleeding somewhere along the digestive tract, between the mouth and anus. This can be caused by various problems. The severity of these problems can range from mild to serious or even life-threatening. If you have GI bleeding, you may find blood in your stools (feces), you may have black stools, or you may vomit blood. If there is a lot of bleeding, you may need to stay in the hospital.  What are the causes?  This condition may be caused by:   Esophagitis. This is inflammation, irritation, or swelling of the esophagus.   Hemorrhoids.These are swollen veins in the rectum.   Anal fissures.These are areas of painful tearing that are often caused by passing hard stool.   Diverticulosis.These are pouches that form on the colon over time, with age, and may bleed a lot.   Diverticulitis.This is inflammation in areas with diverticulosis. It can cause pain, fever, and bloody stools, although bleeding may be mild.   Polyps and cancer. Colon cancer often starts out as precancerous polyps.   Gastritis and ulcers. With these, bleeding may come from the upper GI tract, near the stomach.  What are the signs or symptoms?  Symptoms of this condition may include:   Bright red blood in your vomit, or vomit that looks like coffee grounds.   Bloody, black, or tarry stools.  ? Bleeding from the lower GI tract will usually cause red or maroon blood in the stools.  ? Bleeding from the upper GI tract may cause black, tarry, often bad-smelling stools.  ? In certain cases, if the bleeding is fast enough, the stools may be red.   Pain or cramping in the abdomen.  How is this diagnosed?  This condition may be diagnosed based on:   Medical history and physical exam.   Various tests, such as:  ? Blood tests.  ? X-rays and other imaging tests.  ? Esophagogastroduodenoscopy (EGD). In this test, a flexible, lighted tube is used to look at your esophagus, stomach, and small  intestine.  ? Colonoscopy. In this test, a flexible, lighted tube is used to look at your colon.  How is this treated?  Treatment for this condition depends on the cause of the bleeding. For example:   For bleeding from the esophagus, stomach, small intestine, or colon, the health care provider doing your EGD or colonoscopy may be able to stop the bleeding as part of the procedure.   Inflammation or infection of the colon can be treated with medicines.   Certain rectal problems can be treated with creams, suppositories, or warm baths.   Surgery is sometimes needed.   Blood transfusions are sometimes needed if a lot of blood has been lost.  If bleeding is slow, you may be allowed to go home. If there is a lot of bleeding, you will need to stay in the hospital for observation.  Follow these instructions at home:     Take over-the-counter and prescription medicines only as told by your health care provider.   Eat foods that are high in fiber. This will help to keep your stools soft. These foods include whole grains, legumes, fruits, and vegetables. Eating 1-3 prunes each day works well for many people.   Drink enough fluid to keep your urine clear or pale yellow.   Keep all follow-up visits as told by your health care provider. This is important.  Contact a health care provider if:   Your symptoms do not improve.    Get help right away if:   Your bleeding increases.   You feel light-headed or you faint.   You feel weak.   You have severe cramps in your back or abdomen.   You pass large blood clots in your stool.   Your symptoms are getting worse.  This information is not intended to replace advice given to you by your health care provider. Make sure you discuss any questions you have with your health care provider.  Document Released: 03/05/2000 Document Revised: 08/06/2015 Document Reviewed: 08/26/2014  Elsevier Interactive Patient Education  2019 [employer]

## 2018-09-06 ENCOUNTER — Emergency Department (HOSPITAL_COMMUNITY)
Admission: EM | Admit: 2018-09-06 | Discharge: 2018-09-07 | Disposition: A | Discharge status: Home / Self Care | Payer: Self-pay | Attending: Emergency Medicine | Admitting: Emergency Medicine

## 2018-09-06 ENCOUNTER — Encounter (HOSPITAL_COMMUNITY): Payer: Self-pay | Admitting: Emergency Medicine

## 2018-09-06 DIAGNOSIS — I10 Essential (primary) hypertension: Secondary | ICD-10-CM | POA: Insufficient documentation

## 2018-09-06 DIAGNOSIS — K921 Melena: Secondary | ICD-10-CM | POA: Insufficient documentation

## 2018-09-06 DIAGNOSIS — F17210 Nicotine dependence, cigarettes, uncomplicated: Secondary | ICD-10-CM | POA: Insufficient documentation

## 2018-09-06 DIAGNOSIS — R1084 Generalized abdominal pain: Secondary | ICD-10-CM | POA: Insufficient documentation

## 2018-09-06 LAB — URINALYSIS, ROUTINE W REFLEX MICROSCOPIC
Bilirubin Urine: NEGATIVE
Glucose, UA: NEGATIVE mg/dL
Hgb urine dipstick: NEGATIVE
Ketones, ur: NEGATIVE mg/dL
Leukocytes,Ua: NEGATIVE
Nitrite: NEGATIVE
Protein, ur: NEGATIVE mg/dL
Specific Gravity, Urine: 1.002 — ABNORMAL LOW (ref 1.005–1.030)
pH: 6 (ref 5.0–8.0)

## 2018-09-06 LAB — CBC
HCT: 32 % — ABNORMAL LOW (ref 39.0–52.0)
Hemoglobin: 10.1 g/dL — ABNORMAL LOW (ref 13.0–17.0)
MCH: 27.2 pg (ref 26.0–34.0)
MCHC: 31.6 g/dL (ref 30.0–36.0)
MCV: 86.3 fL (ref 80.0–100.0)
Platelets: 122 10*3/uL — ABNORMAL LOW (ref 150–400)
RBC: 3.71 MIL/uL — ABNORMAL LOW (ref 4.22–5.81)
RDW: 22.2 % — ABNORMAL HIGH (ref 11.5–15.5)
WBC: 4.3 10*3/uL (ref 4.0–10.5)
nRBC: 0 % (ref 0.0–0.2)

## 2018-09-06 LAB — COMPREHENSIVE METABOLIC PANEL
ALT: 26 U/L (ref 0–44)
AST: 78 U/L — ABNORMAL HIGH (ref 15–41)
Albumin: 3.5 g/dL (ref 3.5–5.0)
Alkaline Phosphatase: 162 U/L — ABNORMAL HIGH (ref 38–126)
Anion gap: 11 (ref 5–15)
BUN: <5 mg/dL — ABNORMAL LOW (ref 6–20)
CO2: 23 mmol/L (ref 22–32)
Calcium: 9.2 mg/dL (ref 8.9–10.3)
Chloride: 105 mmol/L (ref 98–111)
Creatinine, Ser: 0.74 mg/dL (ref 0.61–1.24)
GFR calc Af Amer: >60 mL/min (ref >60)
GFR calc non Af Amer: >60 mL/min (ref >60)
Glucose, Bld: 94 mg/dL (ref 70–99)
Potassium: 3.7 mmol/L (ref 3.5–5.1)
Sodium: 139 mmol/L (ref 135–145)
Total Bilirubin: 0.6 mg/dL (ref 0.3–1.2)
Total Protein: 8.2 g/dL — ABNORMAL HIGH (ref 6.5–8.1)

## 2018-09-06 LAB — LIPASE, BLOOD: Lipase: 55 U/L — ABNORMAL HIGH (ref 11–51)

## 2018-09-06 MED ORDER — SODIUM CHLORIDE 0.9% FLUSH: 3.0000 mL | Freq: Once | INTRAVENOUS | Status: DC

## 2018-09-06 NOTE — ED Triage Notes (Signed)
BIB EMS from side of road, pt is homeless. Pt reports dark tarry stool X several days, was released from hospital 09/01/18 for GI bleed. ETOH on board.

## 2018-09-07 ENCOUNTER — Other Ambulatory Visit: Payer: Self-pay

## 2018-09-07 NOTE — ED Provider Notes (Signed)
Emergency Department Provider Note   I have reviewed the triage vital signs and the nursing notes.   HISTORY  Chief Complaint Abdominal Pain   HPI Barry Moore is a 53 y.o. male with multiple medical problems documented below who presents to the emergency department today secondary to persistent dizziness, black tarry stools.  Patient is recently been admitted a couple times for the same.  X-ray is a gastroenterology appointment tomorrow to evaluate for it as well.  He continues to drink, smoke cigarettes but decreased appetite.  He states compliance with all his medications however.  When asked what was his "emergency" and why did he come here tonight what was the change he states "nothing has changed, it is the same old shit".   Right red blood per stools.  No hematemesis.  No worsening abdominal discomfort.  No new medications.  No other associated or modifying symptoms.    Past Medical History:  Diagnosis Date  . Alcoholic hepatitis   . Depression   . ETOH abuse   . Gastric bleed 08/2018  . Hypertension   . Pancreatitis   . Seizures ([place])   . Suicidal behavior     Patient Active Problem List   Diagnosis Date Noted  . Gastrointestinal hemorrhage with melena 08/31/2018  . Colon ulcer   . Polyp of ascending colon   . Acute GI bleeding 08/05/2018  . Transaminitis 08/05/2018  . Abdominal pain 08/05/2018  . Nausea and vomiting 08/05/2018  . Substance induced mood disorder ([place]) 07/16/2017  . Severe recurrent major depression without psychotic features ([place]) 05/15/2017  . Anemia 03/18/2017  . Acute blood loss anemia 02/21/2017  . UGI bleed 02/08/2017  . Alcohol withdrawal ([place]) 02/08/2017  . Cocaine abuse ([place]) 11/18/2016  . GI bleed 11/01/2016  . Alcohol abuse   . Major depressive disorder, recurrent severe without psychotic features ([place]) 07/06/2016  . 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 Seizure ([place]) 05/26/2016  . Tobacco abuse 05/26/2016  .  Homeless 05/26/2016  . Essential hypertension 05/26/2016  . Alcohol dependence with alcohol-induced mood disorder ([place]) 02/14/2016  . Severe alcohol withdrawal without perceptual disturbances without complication ([place]) 54/62/7035  . Alcoholic hepatitis without ascites 02/14/2016  . Thrombocytopenia ([place]) 02/14/2016    Past Surgical History:  Procedure Laterality Date  . BIOPSY  08/05/2018   Procedure: BIOPSY;  Surgeon: Irving Copas., MD;  Location: [facility];  Service: Gastroenterology;;  . BIOPSY  08/07/2018   Procedure: BIOPSY;  Surgeon: Thornton Park, MD;  Location: [facility];  Service: Gastroenterology;;  . COLONOSCOPY WITH PROPOFOL N/A 08/07/2018   Procedure: COLONOSCOPY WITH PROPOFOL;  Surgeon: Thornton Park, MD;  Location: [facility];  Service: Gastroenterology;  Laterality: N/A;  . ESOPHAGOGASTRODUODENOSCOPY (EGD) WITH PROPOFOL N/A 11/02/2016   Procedure: ESOPHAGOGASTRODUODENOSCOPY (EGD) WITH PROPOFOL;  Surgeon: Carol Ada, MD;  Location: [facility];  Service: Endoscopy;  Laterality: N/A;  . ESOPHAGOGASTRODUODENOSCOPY (EGD) WITH PROPOFOL N/A 08/05/2018   Procedure: ESOPHAGOGASTRODUODENOSCOPY (EGD) WITH PROPOFOL;  Surgeon: Rush Landmark Telford Nab., MD;  Location: [facility];  Service: Gastroenterology;  Laterality: N/A;  . HERNIA REPAIR    . LEG SURGERY    . POLYPECTOMY  08/07/2018   Procedure: POLYPECTOMY;  Surgeon: Thornton Park, MD;  Location: [facility];  Service: Gastroenterology;;    Current Outpatient Rx  . Order #: [id number] Class: Normal  . Order #: [id number] Class: Normal  . Order #: [id number] Class: Normal  . Order #: [id number] Class: Normal  . Order #: [id number] Class: Normal  . Order #: [id number] Class: Print  Allergies Tomato and Aspirin  Family History  Problem Relation Age of Onset  . Diabetes Mother   . Alcoholism Mother   . Emphysema Father   . Lung cancer Father   . Alcoholism Father     Social History Social  History   Tobacco Use  . Smoking status: Current Every Day Smoker    Packs/day: 1.00    Types: Cigarettes  . Smokeless tobacco: Never Used  Substance Use Topics  . Alcohol use: Yes    Comment: 36+ beers daily  . Drug use: Not Currently    Frequency: 3.0 times per week    Types: Cocaine    Review of Systems  All other systems negative except as documented in the HPI. All pertinent positives and negatives as reviewed in the HPI. ____________________________________________   PHYSICAL EXAM:  VITAL SIGNS: ED Triage Vitals  Enc Vitals Group     BP 09/06/18 1958 122/87     Pulse Rate 09/06/18 1958 85     Resp 09/06/18 1958 16     Temp 09/06/18 1958 98.4 F (36.9 C)     Temp Source 09/06/18 1958 Oral     SpO2 09/06/18 1938 95 %     Weight 09/07/18 0047 156 lb 4.9 oz (70.9 kg)     Height 09/07/18 0047 5\' 6"  (1.676 m)    Constitutional: Alert and oriented. Well appearing and in no acute distress. Eyes: Conjunctivae are normal. PERRL. EOMI. Head: Atraumatic. Nose: No congestion/rhinnorhea. Mouth/Throat: Mucous membranes are moist.  Oropharynx non-erythematous. Neck: No stridor.  No meningeal signs.   Cardiovascular: Normal rate, regular rhythm. Good peripheral circulation. Grossly normal heart sounds.   Respiratory: Normal respiratory effort.  No retractions. Lungs CTAB. Gastrointestinal: Soft and nontender. No distention.  Musculoskeletal: No lower extremity tenderness nor edema. No gross deformities of extremities. Neurologic:  Normal speech and language. No gross focal neurologic deficits are appreciated.  Skin:  Skin is warm, dry and intact. No rash noted.  ____________________________________________   LABS (all labs ordered are listed, but only abnormal results are displayed)  Labs Reviewed  LIPASE, BLOOD - Abnormal; Notable for the following components:      Result Value   Lipase 55 (*)    All other components within normal limits  COMPREHENSIVE METABOLIC  PANEL - Abnormal; Notable for the following components:   BUN <5 (*)    Total Protein 8.2 (*)    AST 78 (*)    Alkaline Phosphatase 162 (*)    All other components within normal limits  CBC - Abnormal; Notable for the following components:   RBC 3.71 (*)    Hemoglobin 10.1 (*)    HCT 32.0 (*)    RDW 22.2 (*)    Platelets 122 (*)    All other components within normal limits  URINALYSIS, ROUTINE W REFLEX MICROSCOPIC - Abnormal; Notable for the following components:   Color, Urine STRAW (*)    Specific Gravity, Urine 1.002 (*)    All other components within normal limits   ____________________________________________   INITIAL IMPRESSION / ASSESSMENT AND PLAN / ED COURSE  Hemoglobin improved from multiple previous ones.  Is slightly orthostatic but is tolerating p.o. fluids and food.  His abdomen is benign.  Overall patient appears stable I do not doubt that he has an upper GI ooze but he has GI appointment tomorrow do not see any emergent condition requiring emergent intervention in the next 24 hours as this is been going on for prolonged period  of time.  I did have a long discussion with about quit alcohol as that would likely help the situation however he is considering whether that is an option.  Patient persistently normal VS. Tolerating PO. Symptoms resolved. Stable for dc.   GI follow up today.   Pertinent labs & imaging results that were available during my care of the patient were reviewed by me and considered in my medical decision making (see chart for details).  A medical screening exam was performed and I feel the patient has had an appropriate workup for their chief complaint at this time and likelihood of emergent condition existing is low. They have been counseled on decision, discharge, follow up and which symptoms necessitate immediate return to the emergency department. They or their family verbally stated understanding and agreement with plan and discharged in stable  condition.   ____________________________________________  FINAL CLINICAL IMPRESSION(S) / ED DIAGNOSES  Final diagnoses:  Generalized abdominal pain  Melena     MEDICATIONS GIVEN DURING THIS VISIT:  Medications  sodium chloride flush (NS) 0.9 % injection 3 mL (3 mLs Intravenous Not Given 09/07/18 0046)     NEW OUTPATIENT MEDICATIONS STARTED DURING THIS VISIT:  Discharge Medication List as of 09/07/2018  6:35 AM      Note:  This note was prepared with assistance of Dragon voice recognition software. Occasional wrong-word or sound-a-like substitutions may have occurred due to the inherent limitations of voice recognition software.   Shaunee Mulkern, Corene Cornea, MD 09/07/18 [phone]

## 2018-09-07 NOTE — ED Notes (Signed)
Po challenge started.

## 2018-09-08 ENCOUNTER — Ambulatory Visit: Payer: Self-pay | Admitting: Gastroenterology

## 2018-09-08 ENCOUNTER — Inpatient Hospital Stay: Payer: Self-pay | Admitting: Family Medicine

## 2018-09-08 VITALS — Ht 66.0 in | Wt 156.0 lb

## 2018-09-08 NOTE — Progress Notes (Signed)
Called patient this morning.  He states he had forgotten about his clinic visit completely. I reviewed the chart briefly, looks like he was in the ED recently with concern for dark stools. However, hemoglobin continues to trend higher than it has been since his initial hospitalization. ED had suggested that he had bright red blood per rectum, however on review of the colonoscopy images it looks like he has internal hemorrhoids.  When asking the patient if he is having any bleeding he denies. His bowel movements are brown currently. I also reiterated that while he is on iron he will have darker stools. However after a brief couple of minutes the patient states that he cannot continue our visit today. I will have our staff reach out to him trying to get him on a list for follow-up. He noticed to continue taking his Protonix twice daily. He can take Preparation H if necessary for hemorrhoidal bleeding.   Justice Britain, MD [facility] Office # [phone]

## 2018-09-08 NOTE — Patient Instructions (Signed)
We will call and get patient scheduled for a follow-up at his discretion.   Thank you for choosing me and [facility].  Dr. Rush Landmark

## 2018-09-11 ENCOUNTER — Other Ambulatory Visit: Payer: Self-pay

## 2018-09-11 ENCOUNTER — Ambulatory Visit: Payer: Self-pay | Attending: Family Medicine | Admitting: Family Medicine

## 2018-09-15 ENCOUNTER — Encounter (HOSPITAL_COMMUNITY): Payer: Self-pay | Admitting: Emergency Medicine

## 2018-09-15 ENCOUNTER — Emergency Department (HOSPITAL_COMMUNITY)
Admission: EM | Admit: 2018-09-15 | Discharge: 2018-09-16 | Disposition: A | Discharge status: Home / Self Care | Payer: Self-pay | Attending: Emergency Medicine | Admitting: Emergency Medicine

## 2018-09-15 DIAGNOSIS — Z886 Allergy status to analgesic agent status: Secondary | ICD-10-CM | POA: Insufficient documentation

## 2018-09-15 DIAGNOSIS — R1084 Generalized abdominal pain: Secondary | ICD-10-CM | POA: Insufficient documentation

## 2018-09-15 DIAGNOSIS — R45851 Suicidal ideations: Secondary | ICD-10-CM

## 2018-09-15 DIAGNOSIS — F1024 Alcohol dependence with alcohol-induced mood disorder: Secondary | ICD-10-CM | POA: Diagnosis present

## 2018-09-15 DIAGNOSIS — R110 Nausea: Secondary | ICD-10-CM | POA: Insufficient documentation

## 2018-09-15 DIAGNOSIS — Z79899 Other long term (current) drug therapy: Secondary | ICD-10-CM | POA: Insufficient documentation

## 2018-09-15 DIAGNOSIS — F17210 Nicotine dependence, cigarettes, uncomplicated: Secondary | ICD-10-CM | POA: Insufficient documentation

## 2018-09-15 DIAGNOSIS — F10120 Alcohol abuse with intoxication, uncomplicated: Secondary | ICD-10-CM | POA: Insufficient documentation

## 2018-09-15 DIAGNOSIS — Z91018 Allergy to other foods: Secondary | ICD-10-CM | POA: Insufficient documentation

## 2018-09-15 DIAGNOSIS — I10 Essential (primary) hypertension: Secondary | ICD-10-CM | POA: Insufficient documentation

## 2018-09-15 DIAGNOSIS — F332 Major depressive disorder, recurrent severe without psychotic features: Secondary | ICD-10-CM | POA: Insufficient documentation

## 2018-09-15 DIAGNOSIS — F10920 Alcohol use, unspecified with intoxication, uncomplicated: Secondary | ICD-10-CM

## 2018-09-15 DIAGNOSIS — Y908 Blood alcohol level of 240 mg/100 ml or more: Secondary | ICD-10-CM | POA: Insufficient documentation

## 2018-09-15 LAB — COMPREHENSIVE METABOLIC PANEL
ALT: 25 U/L (ref 0–44)
AST: 108 U/L — ABNORMAL HIGH (ref 15–41)
Albumin: 3.5 g/dL (ref 3.5–5.0)
Alkaline Phosphatase: 147 U/L — ABNORMAL HIGH (ref 38–126)
Anion gap: 12 (ref 5–15)
BUN: <5 mg/dL — ABNORMAL LOW (ref 6–20)
CO2: 22 mmol/L (ref 22–32)
Calcium: 8.5 mg/dL — ABNORMAL LOW (ref 8.9–10.3)
Chloride: 103 mmol/L (ref 98–111)
Creatinine, Ser: 0.58 mg/dL — ABNORMAL LOW (ref 0.61–1.24)
GFR calc Af Amer: >60 mL/min (ref >60)
GFR calc non Af Amer: >60 mL/min (ref >60)
Glucose, Bld: 94 mg/dL (ref 70–99)
Potassium: 3.3 mmol/L — ABNORMAL LOW (ref 3.5–5.1)
Sodium: 137 mmol/L (ref 135–145)
Total Bilirubin: 1.1 mg/dL (ref 0.3–1.2)
Total Protein: 7.6 g/dL (ref 6.5–8.1)

## 2018-09-15 LAB — URINALYSIS, ROUTINE W REFLEX MICROSCOPIC
Bilirubin Urine: NEGATIVE
Glucose, UA: NEGATIVE mg/dL
Hgb urine dipstick: NEGATIVE
Ketones, ur: NEGATIVE mg/dL
Leukocytes,Ua: NEGATIVE
Nitrite: NEGATIVE
Protein, ur: NEGATIVE mg/dL
Specific Gravity, Urine: 1.003 — ABNORMAL LOW (ref 1.005–1.030)
pH: 6 (ref 5.0–8.0)

## 2018-09-15 LAB — CBC WITH DIFFERENTIAL/PLATELET
Abs Immature Granulocytes: 0.01 10*3/uL (ref 0.00–0.07)
Basophils Absolute: 0.1 10*3/uL (ref 0.0–0.1)
Basophils Relative: 2 %
Eosinophils Absolute: 0.1 10*3/uL (ref 0.0–0.5)
Eosinophils Relative: 2 %
HCT: 33.4 % — ABNORMAL LOW (ref 39.0–52.0)
Hemoglobin: 10.7 g/dL — ABNORMAL LOW (ref 13.0–17.0)
Immature Granulocytes: 0 %
Lymphocytes Relative: 35 %
Lymphs Abs: 1.4 10*3/uL (ref 0.7–4.0)
MCH: 28.2 pg (ref 26.0–34.0)
MCHC: 32 g/dL (ref 30.0–36.0)
MCV: 87.9 fL (ref 80.0–100.0)
Monocytes Absolute: 0.6 10*3/uL (ref 0.1–1.0)
Monocytes Relative: 14 %
Neutro Abs: 1.9 10*3/uL (ref 1.7–7.7)
Neutrophils Relative %: 47 %
Platelets: 78 10*3/uL — ABNORMAL LOW (ref 150–400)
RBC: 3.8 MIL/uL — ABNORMAL LOW (ref 4.22–5.81)
RDW: 22.1 % — ABNORMAL HIGH (ref 11.5–15.5)
WBC: 4 10*3/uL (ref 4.0–10.5)
nRBC: 0 % (ref 0.0–0.2)

## 2018-09-15 LAB — SALICYLATE LEVEL: Salicylate Lvl: <7 mg/dL (ref 2.8–30.0)

## 2018-09-15 LAB — ACETAMINOPHEN LEVEL: Acetaminophen (Tylenol), Serum: <10 ug/mL — ABNORMAL LOW (ref 10–30)

## 2018-09-15 LAB — LIPASE, BLOOD: Lipase: 40 U/L (ref 11–51)

## 2018-09-15 LAB — ETHANOL: Alcohol, Ethyl (B): 396 mg/dL (ref <10)

## 2018-09-15 LAB — POC OCCULT BLOOD, ED: Fecal Occult Bld: NEGATIVE

## 2018-09-15 MED ORDER — LORAZEPAM 1 MG PO TABS: 0.0000 mg | ORAL_TABLET | Freq: Two times a day (BID) | ORAL | Status: DC

## 2018-09-15 MED ORDER — PANTOPRAZOLE SODIUM 40 MG PO TBEC: 40.0000 mg | DELAYED_RELEASE_TABLET | Freq: Two times a day (BID) | ORAL | Status: DC

## 2018-09-15 MED ORDER — POTASSIUM CHLORIDE CRYS ER 20 MEQ PO TBCR
40.0000 meq | EXTENDED_RELEASE_TABLET | Freq: Once | ORAL | Status: AC
  Administered 2018-09-15: 40 meq via ORAL
  Filled 2018-09-15: qty 2

## 2018-09-15 MED ORDER — LORAZEPAM 1 MG PO TABS
0.0000 mg | ORAL_TABLET | Freq: Four times a day (QID) | ORAL | Status: DC
  Administered 2018-09-15: 20:00:00 2 mg via ORAL
  Administered 2018-09-16: 1 mg via ORAL
  Filled 2018-09-15 (×2): qty 2

## 2018-09-15 MED ORDER — THIAMINE HCL 100 MG/ML IJ SOLN: 100.0000 mg | Freq: Every day | INTRAMUSCULAR | Status: DC

## 2018-09-15 MED ORDER — LORAZEPAM 2 MG/ML IJ SOLN: 0.0000 mg | Freq: Four times a day (QID) | INTRAMUSCULAR | Status: DC

## 2018-09-15 MED ORDER — LORAZEPAM 2 MG/ML IJ SOLN: 0.0000 mg | Freq: Two times a day (BID) | INTRAMUSCULAR | Status: DC

## 2018-09-15 MED ORDER — VITAMIN B-1 100 MG PO TABS
100.0000 mg | ORAL_TABLET | Freq: Every day | ORAL | Status: DC
  Administered 2018-09-15: 20:00:00 100 mg via ORAL
  Filled 2018-09-15: qty 1

## 2018-09-15 NOTE — ED Notes (Signed)
Belongings put in locker 6, valuables brought to security

## 2018-09-15 NOTE — [facility] Counselor (Signed)
Pt reported, having family friend supports however he declined to have clinician contact his supports to gather information.   Vertell Novak, [place], [facility], [facility] Triage Specialist [phone]

## 2018-09-15 NOTE — ED Notes (Signed)
Spoke with [facility] counselor and she recommends a reevaluation with the psychiatrist in the morning.

## 2018-09-15 NOTE — BH Assessment (Addendum)
Tele Assessment Note   Patient Name: Barry Moore MRN: [id number] Referring Physician: Delia Heady, PA-C. Location of Patient: [name], [phone]. Location of Provider: [place] is an 53 y.o. male, who presents voluntary and unaccompanied to [facility]. Per chart, pt was assessed on 08/21/2018, for a similar presentation. Clinician asked the pt, "what brought you to the hospital?" Pt reported, he's been suicidal for the past couple days with a plan of cutting his wrist with his knife. Pt reported, he does not know what triggered his suicidal ideations. Pt reported, a month ago wanting to beat someone to death, but not currently. Pt reported, his knife is locked up in the hospital. Pt denies, HI, AVH, self-injurious behaviors.  Pt denies abuse. Pt reported, drinking alcohol today, but not knowing how much. Pt's BAL was 396 at 1303. Pt reported, he felt like he had an alcohol-related seizure today. Pt denies, being linked to OPT resources (medication management and/or counseling.) Pt reported, previous inpatient admissions.  Pt presents quiet, awake in scrubs with logical, coherent speech. Pt's eye contact was fair. Pt's mood, affect was depressed. Pt's thought process was coherent, relevant. Pt's judgement was impaired. Pt was oriented x4. Pt's concentration was normal. Pt's insight was fair. Pt's impulse control was poor. Pt reported, if discharged from [facility] he could contract for safety.  Diagnosis: Major Depressive Disorder, recurrent, severe without psychotic features.                      Alcohol use Disorder, severe.  Past Medical History:  Past Medical History:  Diagnosis Date  . Alcoholic hepatitis   . Depression   . ETOH abuse   . Gastric bleed 08/2018  . Hypertension   . Pancreatitis   . Seizures ([place])   . Suicidal behavior     Past Surgical History:  Procedure Laterality Date  . BIOPSY  08/05/2018   Procedure: BIOPSY;  Surgeon: Irving Copas., MD;  Location: [place];  Service: Gastroenterology;;  . BIOPSY  08/07/2018   Procedure: BIOPSY;  Surgeon: Thornton Park, MD;  Location: [place];  Service: Gastroenterology;;  . COLONOSCOPY WITH PROPOFOL N/A 08/07/2018   Procedure: COLONOSCOPY WITH PROPOFOL;  Surgeon: Thornton Park, MD;  Location: [place];  Service: Gastroenterology;  Laterality: N/A;  . ESOPHAGOGASTRODUODENOSCOPY (EGD) WITH PROPOFOL N/A 11/02/2016   Procedure: ESOPHAGOGASTRODUODENOSCOPY (EGD) WITH PROPOFOL;  Surgeon: Carol Ada, MD;  [facility];  Service: Endoscopy;  Laterality: N/A;  . ESOPHAGOGASTRODUODENOSCOPY (EGD) WITH PROPOFOL N/A 08/05/2018   Procedure: ESOPHAGOGASTRODUODENOSCOPY (EGD) WITH PROPOFOL;  Surgeon: Rush Landmark Telford Nab., MD;  Location: [place];  Service: Gastroenterology;  Laterality: N/A;  . HERNIA REPAIR    . LEG SURGERY    . POLYPECTOMY  08/07/2018   Procedure: POLYPECTOMY;  Surgeon: Thornton Park, MD;  Location: [facility] ENDOSCOPY;  Service: Gastroenterology;;    Family History:  Family History  Problem Relation Age of Onset  . Diabetes Mother   . Alcoholism Mother   . Emphysema Father   . Lung cancer Father   . Alcoholism Father     Social History:  reports that he has been smoking cigarettes. He has been smoking about 1.00 pack per day. He has never used smokeless tobacco. He reports current alcohol use. He reports previous drug use. Frequency: 3.00 times per week. Drug: Cocaine.  Additional Social History:  Alcohol / Drug Use Pain Medications: See MAR Prescriptions: See MAR Over the Counter: See MAR History of  alcohol / drug use?: Yes Onset of Seizures: Pt reported, he felt he had a alochol-related seizure, today. Date of most recent seizure: Pt reported, today. Substance #1 Name of Substance 1: Alochol 1 - Age of First Use: UTA 1 - Amount (size/oz): Pt reported, "I have not idea." 1 - Frequency: Daily. 1 - Duration: Ongoing. 1 - Last  Use / Amount: Daily.  CIWA: CIWA-Ar BP: 138/88 Pulse Rate: 93 Nausea and Vomiting: intermittent nausea with dry heaves Tactile Disturbances: none Tremor: not visible, but can be felt fingertip to fingertip Auditory Disturbances: very mild harshness or ability to frighten Paroxysmal Sweats: no sweat visible Visual Disturbances: very mild sensitivity Anxiety: no anxiety, at ease Headache, Fullness in Head: moderately severe Agitation: normal activity Orientation and Clouding of Sensorium: oriented and can do serial additions CIWA-Ar Total: 11 COWS:    Allergies:  Allergies  Allergen Reactions  . Tomato Shortness Of Breath and Nausea And Vomiting  . Aspirin Nausea And Vomiting    Home Medications: (Not in a hospital admission)   OB/GYN Status:  No LMP for male patient.  General Assessment Data Assessment unable to be completed: Yes Reason for not completing assessment: Pt is impaired with BAL of 396 Location of Assessment: [facility] ED TTS Assessment: In system Is this a Tele or Face-to-Face Assessment?: Tele Assessment Is this an Initial Assessment or a Re-assessment for this encounter?: Initial Assessment Patient Accompanied by:: N/A Language Other than English: No Living Arrangements: Other (Comment)(Alone. ) What gender do you identify as?: Male Marital status: Separated Living Arrangements: Alone Can pt return to current living arrangement?: No Admission Status: Voluntary Is patient capable of signing voluntary admission?: Yes Referral Source: Self/Family/Friend Insurance type: Self-pay.      Crisis Care Plan Living Arrangements: Alone Legal Guardian: Other:(Self. ) Name of Psychiatrist: NA Name of Therapist: NA  Education Status Is patient currently in school?: No Is the patient employed, unemployed or receiving disability?: Unemployed  Risk to self with the past 6 months Suicidal Ideation: Yes-Currently Present Has patient been a risk to self within the  past 6 months prior to admission? : Yes Suicidal Intent: Yes-Currently Present Has patient had any suicidal intent within the past 6 months prior to admission? : Yes Is patient at risk for suicide?: Yes Suicidal Plan?: Yes-Currently Present Has patient had any suicidal plan within the past 6 months prior to admission? : Yes Specify Current Suicidal Plan: Pt reported, wanted to cut his wrist with his knife.  Access to Means: Yes Specify Access to Suicidal Means: Pt has a knife.  What has been your use of drugs/alcohol within the last 12 months?: Alcohol.  Previous Attempts/Gestures: Yes How many times?: 1 Other Self Harm Risks: Alochol use.  Triggers for Past Attempts: Unknown Intentional Self Injurious Behavior: None(Pt denies. ) Family Suicide History: No Recent stressful life event(s): Other (Comment)(May loose apartment. ) Persecutory voices/beliefs?: No Depression: Yes Depression Symptoms: Feeling worthless/self pity, Loss of interest in usual pleasures, Guilt, Fatigue, Isolating, Insomnia, Despondent Substance abuse history and/or treatment for substance abuse?: Yes Suicide prevention information given to non-admitted patients: Not applicable  Risk to Others within the past 6 months Homicidal Ideation: No-Not Currently/Within Last 6 Months Does patient have any lifetime risk of violence toward others beyond the six months prior to admission? : No(Pt denies. ) Thoughts of Harm to Others: No-Not Currently Present/Within Last 6 Months Current Homicidal Intent: No-Not Currently/Within Last 6 Months Current Homicidal Plan: No-Not Currently/Within Last 6 Months Access to Homicidal  Means: Yes Describe Access to Homicidal Means: Knife.  Identified Victim: "a guy."  History of harm to others?: No(Pt denies. ) Assessment of Violence: None Noted Violent Behavior Description: NA Does patient have access to weapons?: Yes (Comment)(Knife. ) Criminal Charges Pending?: No Does patient have  a court date: No Is patient on probation?: No  Psychosis Hallucinations: None noted Delusions: None noted  Mental Status Report Appearance/Hygiene: In scrubs Eye Contact: Fair Motor Activity: Unremarkable Speech: Logical/coherent Level of Consciousness: Quiet/awake Mood: Depressed Affect: Depressed Anxiety Level: None Thought Processes: Coherent, Relevant Judgement: Impaired Orientation: Person, Place, Time, Situation Obsessive Compulsive Thoughts/Behaviors: None  Cognitive Functioning Concentration: Good Memory: Recent Intact Is patient IDD: No Insight: Fair Impulse Control: Poor Appetite: Poor Have you had any weight changes? : Loss Amount of the weight change? (lbs): 30 lbs(in a month. ) Sleep: Decreased Total Hours of Sleep: (Pt reported, "not many." ) Vegetative Symptoms: Staying in bed  ADLScreening [facility] Assessment Services) Patient's cognitive ability adequate to safely complete daily activities?: Yes Patient able to express need for assistance with ADLs?: Yes Independently performs ADLs?: Yes (appropriate for developmental age)  Prior Inpatient Therapy Prior Inpatient Therapy: Yes Prior Therapy Dates: Per chart, 3 years ago. Prior Therapy Facilty/Provider(s): Per chart, Mental Health in [place], [place] [facility]. Reason for Treatment: Depression, SI.   Prior Outpatient Therapy Prior Outpatient Therapy: No Does patient have an ACCT team?: No Does patient have Intensive In-House Services?  : No Does patient have [facility] services? : No Does patient have P4CC services?: No  ADL Screening (condition at time of admission) Patient's cognitive ability adequate to safely complete daily activities?: Yes Is the patient deaf or have difficulty hearing?: No Does the patient have difficulty seeing, even when wearing glasses/contacts?: No Does the patient have difficulty concentrating, remembering, or making decisions?: No Patient able to express need for assistance with  ADLs?: Yes Does the patient have difficulty dressing or bathing?: No Independently performs ADLs?: Yes (appropriate for developmental age) Does the patient have difficulty walking or climbing stairs?: No Weakness of Legs: None Weakness of Arms/Hands: None  Home Assistive Devices/Equipment Home Assistive Devices/Equipment: None    Abuse/Neglect Assessment (Assessment to be complete while patient is alone) Abuse/Neglect Assessment Can Be Completed: Yes Physical Abuse: Denies(Pt denies.) Verbal Abuse: Denies(Pt denies.) Sexual Abuse: Denies(Pt denies.) Exploitation of patient/patient's resources: Denies(Pt denies.) Self-Neglect: Denies(Pt denies.)     Advance Directives (For Healthcare) Does Patient Have a Medical Advance Directive?: No           Disposition: Patriciaann Clan, PA recommends the pt to be reassessed by psychiatry. Disposition discussed with Lattie Haw, PA and [place], RN.    Disposition Initial Assessment Completed for this Encounter: Yes  This service was provided via telemedicine using a 2-way, interactive audio and video technology.  Names of all persons participating in this telemedicine service and their role in this encounter. Name: Barry Moore. Role: Patient.   Name: Vertell Novak, MS, [facility], [place]. Role: Counselor.           Vertell Novak 09/15/2018 10:16 PM     Vertell Novak, [place], [facility], [facility] Triage Specialist [phone]

## 2018-09-15 NOTE — ED Provider Notes (Signed)
[place] EMERGENCY DEPARTMENT Provider Note   CSN: [id number] Arrival date & time: 09/15/18  1218    History   Chief Complaint Chief Complaint  Patient presents with  . Diarrhea  . Suicidal    HPI Barry Moore is a 53 y.o. male with a past medical history of alcohol abuse, hypertension, prior GI bleed presents to ED for multiple complaints. His first complaint is generalized abdominal pain, nausea and green diarrhea.  States that symptoms have been going on for "a while" but able to tell me exactly when symptoms began.  He notes continuing to drink alcohol daily but is unable to quantify.  He states that nothing new prompted his visit today. He also complains of suicidal ideation.  Plans to cut himself with a knife which he states was taking from him prior to ED arrival.  Endorses homicidal ideations but unable to tell me who he is homicidal towards.  Reports auditory hallucinations but denies visual hallucinations.  Denies any tremors, seizures.  His last drink was just prior to arrival.     HPI  Past Medical History:  Diagnosis Date  . Alcoholic hepatitis   . Depression   . ETOH abuse   . Gastric bleed 08/2018  . Hypertension   . Pancreatitis   . Seizures ([place])   . Suicidal behavior     Patient Active Problem List   Diagnosis Date Noted  . Gastrointestinal hemorrhage with melena 08/31/2018  . Colon ulcer   . Polyp of ascending colon   . Acute GI bleeding 08/05/2018  . Transaminitis 08/05/2018  . Abdominal pain 08/05/2018  . Nausea and vomiting 08/05/2018  . Substance induced mood disorder ([place]) 07/16/2017  . Severe recurrent major depression without psychotic features ([place]) 05/15/2017  . Anemia 03/18/2017  . Acute blood loss anemia 02/21/2017  . UGI bleed 02/08/2017  . Alcohol withdrawal ([place]) 02/08/2017  . Cocaine abuse ([place]) 11/18/2016  . GI bleed 11/01/2016  . Alcohol abuse   . Major depressive disorder, recurrent severe without  psychotic features ([place]) 07/06/2016  . 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 Seizure ([place]) 05/26/2016  . Tobacco abuse 05/26/2016  . Homeless 05/26/2016  . Essential hypertension 05/26/2016  . Alcohol dependence with alcohol-induced mood disorder ([place]) 02/14/2016  . Severe alcohol withdrawal without perceptual disturbances without complication ([place]) 67/61/9509  . Alcoholic hepatitis without ascites 02/14/2016  . Thrombocytopenia ([place]) 02/14/2016    Past Surgical History:  Procedure Laterality Date  . BIOPSY  08/05/2018   Procedure: BIOPSY;  Surgeon: Irving Copas., MD;  Location: [place];  Service: Gastroenterology;;  . BIOPSY  08/07/2018   Procedure: BIOPSY;  Surgeon: Thornton Park, MD;  Location: [place];  Service: Gastroenterology;;  . COLONOSCOPY WITH PROPOFOL N/A 08/07/2018   Procedure: COLONOSCOPY WITH PROPOFOL;  Surgeon: Thornton Park, MD;  Location: [place];  Service: Gastroenterology;  Laterality: N/A;  . ESOPHAGOGASTRODUODENOSCOPY (EGD) WITH PROPOFOL N/A 11/02/2016   Procedure: ESOPHAGOGASTRODUODENOSCOPY (EGD) WITH PROPOFOL;  Surgeon: Carol Ada, MD;  [facility];  Service: Endoscopy;  Laterality: N/A;  . ESOPHAGOGASTRODUODENOSCOPY (EGD) WITH PROPOFOL N/A 08/05/2018   Procedure: ESOPHAGOGASTRODUODENOSCOPY (EGD) WITH PROPOFOL;  Surgeon: Rush Landmark Telford Nab., MD;  Location: [place];  Service: Gastroenterology;  Laterality: N/A;  . HERNIA REPAIR    . LEG SURGERY    . POLYPECTOMY  08/07/2018   Procedure: POLYPECTOMY;  Surgeon: Thornton Park, MD;  Location: [facility] ENDOSCOPY;  Service: Gastroenterology;;        Home Medications  Prior to Admission medications   Medication Sig Start Date End Date Taking? Authorizing Provider  ferrous sulfate 325 (65 FE) MG tablet Take 1 tablet (325 mg total) by mouth daily with breakfast. 08/09/18 08/09/19  Elodia Florence., MD  folic acid (FOLVITE) 1 MG tablet Take 1 tablet (1  mg total) by mouth daily. 08/09/18   Elodia Florence., MD  Multiple Vitamin (MULTIVITAMIN WITH MINERALS) TABS tablet Take 1 tablet by mouth daily. Patient not taking: Reported on 09/07/2018 08/09/18   Elodia Florence., MD  pantoprazole (PROTONIX) 40 MG tablet Take 1 tablet (40 mg total) by mouth 2 (two) times daily. 08/09/18   Elodia Florence., MD  thiamine 100 MG tablet Take 1 tablet (100 mg total) by mouth daily. 08/09/18   Elodia Florence., MD    Family History Family History  Problem Relation Age of Onset  . Diabetes Mother   . Alcoholism Mother   . Emphysema Father   . Lung cancer Father   . Alcoholism Father     Social History Social History   Tobacco Use  . Smoking status: Current Every Day Smoker    Packs/day: 1.00    Types: Cigarettes  . Smokeless tobacco: Never Used  Substance Use Topics  . Alcohol use: Yes    Comment: 36+ beers daily  . Drug use: Not Currently    Frequency: 3.0 times per week    Types: Cocaine     Allergies   Tomato and Aspirin   Review of Systems Review of Systems  Constitutional: Negative for appetite change, chills and fever.  HENT: Negative for ear pain, rhinorrhea, sneezing and sore throat.   Eyes: Negative for photophobia and visual disturbance.  Respiratory: Negative for cough, chest tightness, shortness of breath and wheezing.   Cardiovascular: Negative for chest pain and palpitations.  Gastrointestinal: Positive for abdominal pain, diarrhea and nausea. Negative for blood in stool, constipation and vomiting.  Genitourinary: Negative for dysuria, hematuria and urgency.  Musculoskeletal: Negative for myalgias.  Skin: Negative for rash.  Neurological: Negative for dizziness, weakness and light-headedness.     Physical Exam Updated Vital Signs BP (!) 155/116 (BP Location: Right Arm)   Pulse 88   Temp 98.9 F (37.2 C) (Oral)   Resp 18   SpO2 98%   Physical Exam Vitals signs and nursing note reviewed.   Constitutional:      General: He is not in acute distress.    Appearance: He is well-developed.  HENT:     Head: Normocephalic and atraumatic.     Nose: Nose normal.  Eyes:     General: No scleral icterus.       Right eye: No discharge.        Left eye: No discharge.     Conjunctiva/sclera: Conjunctivae normal.  Neck:     Musculoskeletal: Normal range of motion and neck supple.  Cardiovascular:     Rate and Rhythm: Normal rate and regular rhythm.     Heart sounds: Normal heart sounds. No murmur. No friction rub. No gallop.   Pulmonary:     Effort: Pulmonary effort is normal. No respiratory distress.     Breath sounds: Normal breath sounds.  Abdominal:     General: Bowel sounds are normal. There is no distension.     Palpations: Abdomen is soft.     Tenderness: There is no abdominal tenderness. There is no guarding.  Musculoskeletal: Normal range of motion.  Skin:  General: Skin is warm and dry.     Findings: No rash.  Neurological:     Mental Status: He is alert.     Motor: No abnormal muscle tone.     Coordination: Coordination normal.      ED Treatments / Results  Labs (all labs ordered are listed, but only abnormal results are displayed) Labs Reviewed  COMPREHENSIVE METABOLIC PANEL - Abnormal; Notable for the following components:      Result Value   Potassium 3.3 (*)    BUN <5 (*)    Creatinine, Ser 0.58 (*)    Calcium 8.5 (*)    AST 108 (*)    Alkaline Phosphatase 147 (*)    All other components within normal limits  ETHANOL - Abnormal; Notable for the following components:   Alcohol, Ethyl (B) 396 (*)    All other components within normal limits  CBC WITH DIFFERENTIAL/PLATELET - Abnormal; Notable for the following components:   RBC 3.80 (*)    Hemoglobin 10.7 (*)    HCT 33.4 (*)    RDW 22.1 (*)    Platelets 78 (*)    All other components within normal limits  ACETAMINOPHEN LEVEL - Abnormal; Notable for the following components:   Acetaminophen  (Tylenol), Serum <10 (*)    All other components within normal limits  URINALYSIS, ROUTINE W REFLEX MICROSCOPIC - Abnormal; Notable for the following components:   Color, Urine STRAW (*)    Specific Gravity, Urine 1.003 (*)    All other components within normal limits  LIPASE, BLOOD  SALICYLATE LEVEL  POC OCCULT BLOOD, ED    EKG EKG Interpretation  Date/Time:  Friday September 15 2018 12:54:00 EDT Ventricular Rate:  81 PR Interval:    QRS Duration: 99 QT Interval:  388 QTC Calculation: 451 R Axis:   90 Text Interpretation:  Sinus rhythm Borderline right axis deviation ST elev, probable normal early repol pattern Interpretation limited secondary to artifact Confirmed by Quintella Reichert [phone]) on 09/15/2018 1:07:41 PM   Radiology No results found.  Procedures Procedures (including critical care time)  Medications Ordered in ED Medications  potassium chloride SA (K-DUR) CR tablet 40 mEq (has no administration in time range)  LORazepam (ATIVAN) injection 0-4 mg (has no administration in time range)    Or  LORazepam (ATIVAN) tablet 0-4 mg (has no administration in time range)  LORazepam (ATIVAN) injection 0-4 mg (has no administration in time range)    Or  LORazepam (ATIVAN) tablet 0-4 mg (has no administration in time range)  thiamine (VITAMIN B-1) tablet 100 mg (has no administration in time range)    Or  thiamine (B-1) injection 100 mg (has no administration in time range)     Initial Impression / Assessment and Plan / ED Course  I have reviewed the triage vital signs and the nursing notes.  Pertinent labs & imaging results that were available during my care of the patient were reviewed by me and considered in my medical decision making (see chart for details).        53 year old male past medical history of alcohol abuse presents to ED for diarrhea, generalized abdominal pain and suicidal ideation.  Patient recently suffered from GI bleed, so lab work and Hemoccult  were tested.  Stool is Hemoccult negative.  Hemoglobin is 10.7 which is higher than prior value.  Potassium 3.2 which is repleted orally.  Ethanol of 396.  Patient expresses suicidal ideation with a plan to use a knife to hurt  himself.  Began on CIWA protocol, patient will be evaluated by TTS.  Final Clinical Impressions(s) / ED Diagnoses   Final diagnoses:  Suicidal ideation  Alcoholic intoxication without complication [facility])    ED Discharge Orders    None       Delia Heady, PA-C 09/15/18 1416    Quintella Reichert, MD 09/21/18 [phone]

## 2018-09-15 NOTE — ED Triage Notes (Signed)
Pt to ER by [facility] for suicidal ideations due to be sad about being homeless and hungry. Also complaining of abdominal pain and diarrhea. ETOH onboard.

## 2018-09-16 ENCOUNTER — Other Ambulatory Visit: Payer: Self-pay

## 2018-09-16 DIAGNOSIS — F1024 Alcohol dependence with alcohol-induced mood disorder: Secondary | ICD-10-CM

## 2018-09-16 DIAGNOSIS — R45851 Suicidal ideations: Secondary | ICD-10-CM

## 2018-09-16 NOTE — ED Notes (Signed)
Pt denies SI/HI, AH/VH - states "I feel better".

## 2018-09-16 NOTE — ED Notes (Signed)
Breakfast tray ordered 

## 2018-09-16 NOTE — ED Provider Notes (Signed)
Emergency Medicine Observation Re-evaluation Note  Barry Moore is a 53 y.o. male, seen on rounds today.  Pt initially presented to the ED for complaints of Diarrhea and Suicidal Currently, the patient is awake, awaiting discharge.  Physical Exam  BP (!) 143/78   Pulse 71   Temp 98.4 F (36.9 C) (Oral)   Resp 18   SpO2 100%  Physical Exam Awake, alert, denies suicidal or homicidal ideation.  Reports to be feeling well.  Respirations even and unlabored. ED Course / MDM  EKG:EKG Interpretation  Date/Time:  Friday September 15 2018 12:54:00 EDT Ventricular Rate:  81 PR Interval:    QRS Duration: 99 QT Interval:  388 QTC Calculation: 451 R Axis:   90 Text Interpretation:  Sinus rhythm Borderline right axis deviation ST elev, probable normal early repol pattern Interpretation limited secondary to artifact Confirmed by Quintella Reichert [phone]) on 09/15/2018 1:07:41 PM    I have reviewed the labs performed to date as well as medications administered while in observation.  Recent changes in the last 24 hours include assessed by behavioral health, reports feeling better and is determined ready for discharge.. Plan  Current plan is for discharge, given outpatient resources. Patient is not under full IVC at this time.   Tacy Learn, PA-C 09/16/18 [id number]    Sherwood Gambler, MD 09/19/18 [phone]

## 2018-09-16 NOTE — Progress Notes (Signed)
Per Marvia Pickles, NP, pt has been psych cleared. Darnelle Maffucci notified PA/Becky RN.  Dalaney Needle S. Ouida Sills, MSW, LCSW Clinical Social Worker 09/16/2018 8:39 AM

## 2018-09-16 NOTE — Consult Note (Signed)
Telepsych Consultation   Reason for Consult:  Suicidal ideations Referring Physician:  EDP Location of Patient:  Location of Provider: [facility] Department  Patient Identification: Danis Pembleton MRN:  [id number] Principal Diagnosis: Alcohol dependence with alcohol-induced mood disorder ([place]) Diagnosis:  Principal Problem:   Alcohol dependence with alcohol-induced mood disorder ([place])   Total Time spent with patient: 30 minutes  Subjective:   Jessy Cybulski is a 53 y.o. male patient reports that he is feeling much better this morning.  He denies any suicidal or homicidal ideations and denies any hallucinations.  Patient reports that he was drinking heavily yesterday.  Patient denies any depression or anxiety this morning and states that he feels that he is ready to go.  HPI: Patient is a 53 year old male that is well-known to this provider.  Patient has frequent visits to the ED for alcohol or substance abuse.  Patient frequents the ED reporting SI for a place to stay overnight and then is usually discharged within the next 1 to 2 days.  Patient stated he already had his resources in place and did not require any additional information.  Patient stated that he does not need any medications at this time either.  Patient is informed about the dangers of his heavy alcohol consumption and patient stated agreement and understanding.  Patient is seen by me today via tele-psych and have consulted with Dr. Dwyane Dee.  At this time the patient denies any suicidal or homicidal ideations and does not meet inpatient criteria and is psychiatrically cleared.  I have contacted Suella Broad, PA-C and notified her of the recommendations.  Past Psychiatric History: alcohol dependence severe, MDD severe, cocaine abuse, multiple ED visits, numerous hospitalizations over the years  Risk to Self: Suicidal Ideation: Yes-Currently Present Suicidal Intent: Yes-Currently Present Is patient at risk for  suicide?: Yes Suicidal Plan?: Yes-Currently Present Specify Current Suicidal Plan: Pt reported, wanted to cut his wrist with his knife.  Access to Means: Yes Specify Access to Suicidal Means: Pt has a knife.  What has been your use of drugs/alcohol within the last 12 months?: Alcohol.  How many times?: 1 Other Self Harm Risks: Alochol use.  Triggers for Past Attempts: Unknown Intentional Self Injurious Behavior: None(Pt denies. ) Risk to Others: Homicidal Ideation: No-Not Currently/Within Last 6 Months Thoughts of Harm to Others: No-Not Currently Present/Within Last 6 Months Current Homicidal Intent: No-Not Currently/Within Last 6 Months Current Homicidal Plan: No-Not Currently/Within Last 6 Months Access to Homicidal Means: Yes Describe Access to Homicidal Means: Knife.  Identified Victim: "a guy."  History of harm to others?: No(Pt denies. ) Assessment of Violence: None Noted Violent Behavior Description: NA Does patient have access to weapons?: Yes (Comment)(Knife. ) Criminal Charges Pending?: No Does patient have a court date: No Prior Inpatient Therapy: Prior Inpatient Therapy: Yes Prior Therapy Dates: Per chart, 3 years ago. Prior Therapy Facilty/Provider(s): Per chart, [facility] in [place], [facility]. Reason for Treatment: Depression, SI.  Prior Outpatient Therapy: Prior Outpatient Therapy: No Does patient have an ACCT team?: No Does patient have Intensive In-House Services?  : No Does patient have [facility] services? : No Does patient have P4CC services?: No  Past Medical History:  Past Medical History:  Diagnosis Date  . Alcoholic hepatitis   . Depression   . ETOH abuse   . Gastric bleed 08/2018  . Hypertension   . Pancreatitis   . Seizures ([place])   . Suicidal behavior     Past Surgical History:  Procedure  Laterality Date  . BIOPSY  08/05/2018   Procedure: BIOPSY;  Surgeon: Irving Copas., MD;  Location: [place];  Service: Gastroenterology;;   . BIOPSY  08/07/2018   Procedure: BIOPSY;  Surgeon: Thornton Park, MD;  Location: [facility];  Service: Gastroenterology;;  . COLONOSCOPY WITH PROPOFOL N/A 08/07/2018   Procedure: COLONOSCOPY WITH PROPOFOL;  Surgeon: Thornton Park, MD;  Location: [place];  Service: Gastroenterology;  Laterality: N/A;  . ESOPHAGOGASTRODUODENOSCOPY (EGD) WITH PROPOFOL N/A 11/02/2016   Procedure: ESOPHAGOGASTRODUODENOSCOPY (EGD) WITH PROPOFOL;  Surgeon: Carol Ada, MD;  Location: [facility];  Service: Endoscopy;  Laterality: N/A;  . ESOPHAGOGASTRODUODENOSCOPY (EGD) WITH PROPOFOL N/A 08/05/2018   Procedure: ESOPHAGOGASTRODUODENOSCOPY (EGD) WITH PROPOFOL;  Surgeon: Rush Landmark Telford Nab., MD;  Location: [place];  Service: Gastroenterology;  Laterality: N/A;  . HERNIA REPAIR    . LEG SURGERY    . POLYPECTOMY  08/07/2018   Procedure: POLYPECTOMY;  Surgeon: Thornton Park, MD;  Location: [facility] ENDOSCOPY;  Service: Gastroenterology;;   Family History:  Family History  Problem Relation Age of Onset  . Diabetes Mother   . Alcoholism Mother   . Emphysema Father   . Lung cancer Father   . Alcoholism Father    Family Psychiatric  History: mother and father were alcoholics Social History:  Social History   Substance and Sexual Activity  Alcohol Use Yes   Comment: 36+ beers daily     Social History   Substance and Sexual Activity  Drug Use Not Currently  . Frequency: 3.0 times per week  . Types: Cocaine    Social History   Socioeconomic History  . Marital status: Divorced    Spouse name: Not on file  . Number of children: Not on file  . Years of education: Not on file  . Highest education level: Not on file  Occupational History  . Not on file  Social Needs  . Financial resource strain: Not on file  . Food insecurity    Worry: Not on file    Inability: Not on file  . Transportation needs    Medical: Not on file    Non-medical: Not on file  Tobacco Use  . Smoking status:  Current Every Day Smoker    Packs/day: 1.00    Types: Cigarettes  . Smokeless tobacco: Never Used  Substance and Sexual Activity  . Alcohol use: Yes    Comment: 36+ beers daily  . Drug use: Not Currently    Frequency: 3.0 times per week    Types: Cocaine  . Sexual activity: Not Currently  Lifestyle  . Physical activity    Days per week: Not on file    Minutes per session: Not on file  . Stress: Not on file  Relationships  . Social Herbalist on phone: Not on file    Gets together: Not on file    Attends religious service: Not on file    Active member of club or organization: Not on file    Attends meetings of clubs or organizations: Not on file    Relationship status: Not on file  Other Topics Concern  . Not on file  Social History Narrative  . Not on file   Additional Social History:    Allergies:   Allergies  Allergen Reactions  . Tomato Shortness Of Breath and Nausea And Vomiting  . Aspirin Nausea And Vomiting    Labs:  Results for orders placed or performed during the hospital encounter of 09/15/18 (from the  past 48 hour(s))  Comprehensive metabolic panel     Status: Abnormal   Collection Time: 09/15/18  1:03 PM  Result Value Ref Range   Sodium 137 135 - 145 mmol/L   Potassium 3.3 (L) 3.5 - 5.1 mmol/L   Chloride 103 98 - 111 mmol/L   CO2 22 22 - 32 mmol/L   Glucose, Bld 94 70 - 99 mg/dL   BUN <5 (L) 6 - 20 mg/dL   Creatinine, Ser 0.58 (L) 0.61 - 1.24 mg/dL   Calcium 8.5 (L) 8.9 - 10.3 mg/dL   Total Protein 7.6 6.5 - 8.1 g/dL   Albumin 3.5 3.5 - 5.0 g/dL   AST 108 (H) 15 - 41 U/L   ALT 25 0 - 44 U/L   Alkaline Phosphatase 147 (H) 38 - 126 U/L   Total Bilirubin 1.1 0.3 - 1.2 mg/dL   GFR calc non Af Amer >60 >60 mL/min   GFR calc Af Amer >60 >60 mL/min   Anion gap 12 5 - 15    Comment: Performed at [facility], [address] [place], [place] [address]  Ethanol     Status: Abnormal   Collection Time: 09/15/18  1:03 PM  Result Value  Ref Range   Alcohol, Ethyl (B) 396 (HH) <10 mg/dL    Comment: CRITICAL RESULT CALLED TO, READ BACK BY AND VERIFIED WITH: H.HALL,RN 1350 09/15/2018 CLARK,S (NOTE) Lowest detectable limit for serum alcohol is 10 mg/dL. For medical purposes only. Performed at [facility], [place] [address] [place], [place] [address]   Lipase, blood     Status: None   Collection Time: 09/15/18  1:03 PM  Result Value Ref Range   Lipase 40 11 - 51 U/L    Comment: Performed at [facility], [place] [address], [place], [place] [address]  CBC with Differential     Status: Abnormal   Collection Time: 09/15/18  1:03 PM  Result Value Ref Range   WBC 4.0 4.0 - 10.5 K/uL   RBC 3.80 (L) 4.22 - 5.81 MIL/uL   Hemoglobin 10.7 (L) 13.0 - 17.0 g/dL   HCT 33.4 (L) 39.0 - 52.0 %   MCV 87.9 80.0 - 100.0 fL   MCH 28.2 26.0 - 34.0 pg   MCHC 32.0 30.0 - 36.0 g/dL   RDW 22.1 (H) 11.5 - 15.5 %   Platelets 78 (L) 150 - 400 K/uL    Comment: REPEATED TO VERIFY PLATELET COUNT CONFIRMED BY SMEAR Immature Platelet Fraction may be clinically indicated, consider ordering this additional test [id number]    nRBC 0.0 0.0 - 0.2 %   Neutrophils Relative % 47 %   Neutro Abs 1.9 1.7 - 7.7 K/uL   Lymphocytes Relative 35 %   Lymphs Abs 1.4 0.7 - 4.0 K/uL   Monocytes Relative 14 %   Monocytes Absolute 0.6 0.1 - 1.0 K/uL   Eosinophils Relative 2 %   Eosinophils Absolute 0.1 0.0 - 0.5 K/uL   Basophils Relative 2 %   Basophils Absolute 0.1 0.0 - 0.1 K/uL   Smear Review MORPHOLOGY UNREMARKABLE    Immature Granulocytes 0 %   Abs Immature Granulocytes 0.01 0.00 - 0.07 K/uL    Comment: Performed at [facility], [place] [address], [place], [place] [address]  Acetaminophen level     Status: Abnormal   Collection Time: 09/15/18  1:03 PM  Result Value Ref Range   Acetaminophen (Tylenol), Serum <10 (L) 10 - 30 ug/mL  Comment: Performed at [facility], [place] [address], [place], [place] [address]  Salicylate level      Status: None   Collection Time: 09/15/18  1:03 PM  Result Value Ref Range   Salicylate Lvl <8.1 2.8 - 30.0 mg/dL    Comment: Performed at [facility] [address] [place], [place] [address]  Urinalysis, Routine w reflex microscopic     Status: Abnormal   Collection Time: 09/15/18  1:10 PM  Result Value Ref Range   Color, Urine STRAW (A) YELLOW   APPearance CLEAR CLEAR   Specific Gravity, Urine 1.003 (L) 1.005 - 1.030   pH 6.0 5.0 - 8.0   Glucose, UA NEGATIVE NEGATIVE mg/dL   Hgb urine dipstick NEGATIVE NEGATIVE   Bilirubin Urine NEGATIVE NEGATIVE   Ketones, ur NEGATIVE NEGATIVE mg/dL   Protein, ur NEGATIVE NEGATIVE mg/dL   Nitrite NEGATIVE NEGATIVE   Leukocytes,Ua NEGATIVE NEGATIVE    Comment: Performed at [facility] [address] [place],  [address]  POC occult blood, ED     Status: None   Collection Time: 09/15/18  1:45 PM  Result Value Ref Range   Fecal Occult Bld NEGATIVE NEGATIVE    Medications:  Current Facility-Administered Medications  Medication Dose Route Frequency Provider Last Rate Last Dose  . LORazepam (ATIVAN) injection 0-4 mg  0-4 mg Intravenous Q6H Khatri, Hina, PA-C       Or  . LORazepam (ATIVAN) tablet 0-4 mg  0-4 mg Oral Q6H Khatri, Hina, PA-C   1 mg at 09/16/18 0132  . [START ON 09/17/2018] LORazepam (ATIVAN) injection 0-4 mg  0-4 mg Intravenous Q12H Khatri, Hina, PA-C       Or  . [START ON 09/17/2018] LORazepam (ATIVAN) tablet 0-4 mg  0-4 mg Oral Q12H Khatri, Hina, PA-C      . pantoprazole (PROTONIX) EC tablet 40 mg  40 mg Oral BID Carlisle Cater, PA-C      . thiamine (VITAMIN B-1) tablet 100 mg  100 mg Oral Daily Khatri, Hina, PA-C   100 mg at 09/15/18 1932   Or  . thiamine (B-1) injection 100 mg  100 mg Intravenous Daily Khatri, Hina, PA-C       Current Outpatient Medications  Medication Sig Dispense Refill  . ferrous sulfate 325 (65 FE) MG tablet Take 1 tablet (325 mg total) by mouth daily with breakfast. 30 tablet 11  .  folic acid (FOLVITE) 1 MG tablet Take 1 tablet (1 mg total) by mouth daily. 30 tablet 0  . Multiple Vitamin (MULTIVITAMIN WITH MINERALS) TABS tablet Take 1 tablet by mouth daily. 30 tablet 0  . pantoprazole (PROTONIX) 40 MG tablet Take 1 tablet (40 mg total) by mouth 2 (two) times daily. 60 tablet 0  . thiamine 100 MG tablet Take 1 tablet (100 mg total) by mouth daily. 30 tablet 0    Musculoskeletal: Strength & Muscle Tone: within normal limits Gait & Station: normal Patient leans: N/A  Psychiatric Specialty Exam: Physical Exam  Nursing note and vitals reviewed. Constitutional: He is oriented to person, place, and time. He appears well-developed and well-nourished.  Cardiovascular: Normal rate.  Respiratory: Effort normal.  Musculoskeletal: Normal range of motion.  Neurological: He is alert and oriented to person, place, and time.  Skin: Skin is warm.    Review of Systems  Constitutional: Negative.   HENT: Negative.   Eyes: Negative.   Respiratory: Negative.   Cardiovascular: Negative.   Gastrointestinal: Negative.  Genitourinary: Negative.   Musculoskeletal: Negative.   Skin: Negative.   Neurological: Negative.   Endo/Heme/Allergies: Negative.   Psychiatric/Behavioral: Positive for substance abuse.    Blood pressure (!) 143/78, pulse 71, temperature 98.4 F (36.9 C), temperature source Oral, resp. rate 18, SpO2 100 %.There is no height or weight on file to calculate BMI.  General Appearance: Disheveled  Eye Contact:  Good  Speech:  Clear and Coherent and Normal Rate  Volume:  Normal  Mood:  Euthymic  Affect:  Congruent  Thought Process:  Coherent and Descriptions of Associations: Intact  Orientation:  Full (Time, Place, and Person)  Thought Content:  WDL  Suicidal Thoughts:  No  Homicidal Thoughts:  No  Memory:  Immediate;   Fair Recent;   Fair Remote;   Fair  Judgement:  Fair  Insight:  Good  Psychomotor Activity:  Normal  Concentration:  Concentration: Good  and Attention Span: Good  Recall:  Good  Fund of Knowledge:  Good  Language:  Good  Akathisia:  No  Handed:  Right  AIMS (if indicated):     Assets:  Communication Skills Desire for Improvement Resilience Transportation  ADL's:  Intact  Cognition:  WNL  Sleep:        Treatment Plan Summary: cessation of alcohol use  Follow up with outpatient resources  Disposition: No evidence of imminent risk to self or others at present.   Patient does not meet criteria for psychiatric inpatient admission. Supportive therapy provided about ongoing stressors. Discussed crisis plan, support from social network, calling 911, coming to the Emergency Department, and calling Suicide Hotline.  This service was provided via telemedicine using a 2-way, interactive audio and video technology.  Names of all persons participating in this telemedicine service and their role in this encounter. Name: [name] Role: Patient  Name: Marvia Pickles NP Role: Provider  Name:  Role:   Name:  Role:     Lewis Shock, FNP 09/16/2018 8:38 AM

## 2018-09-16 NOTE — ED Notes (Signed)
Telepsych performed.

## 2018-09-16 NOTE — ED Notes (Signed)
ALL belongings - 1 labeled belongings bag, 1 black back pack, and 1 valuables envelope - returned to pt - Pt signed verifying all items present.

## 2018-09-20 ENCOUNTER — Other Ambulatory Visit: Payer: Self-pay

## 2018-09-20 ENCOUNTER — Encounter (HOSPITAL_COMMUNITY): Payer: Self-pay | Admitting: Emergency Medicine

## 2018-09-20 DIAGNOSIS — R45851 Suicidal ideations: Secondary | ICD-10-CM | POA: Insufficient documentation

## 2018-09-20 DIAGNOSIS — F329 Major depressive disorder, single episode, unspecified: Secondary | ICD-10-CM | POA: Insufficient documentation

## 2018-09-20 DIAGNOSIS — I10 Essential (primary) hypertension: Secondary | ICD-10-CM | POA: Insufficient documentation

## 2018-09-20 DIAGNOSIS — F17210 Nicotine dependence, cigarettes, uncomplicated: Secondary | ICD-10-CM | POA: Insufficient documentation

## 2018-09-20 DIAGNOSIS — Z79899 Other long term (current) drug therapy: Secondary | ICD-10-CM | POA: Insufficient documentation

## 2018-09-20 DIAGNOSIS — F10920 Alcohol use, unspecified with intoxication, uncomplicated: Secondary | ICD-10-CM | POA: Insufficient documentation

## 2018-09-20 DIAGNOSIS — Z046 Encounter for general psychiatric examination, requested by authority: Secondary | ICD-10-CM | POA: Insufficient documentation

## 2018-09-20 DIAGNOSIS — Y908 Blood alcohol level of 240 mg/100 ml or more: Secondary | ICD-10-CM | POA: Insufficient documentation

## 2018-09-20 NOTE — ED Triage Notes (Signed)
Patient complaining of suicidal and is intoxicated. Patient does not have a plan.

## 2018-09-21 ENCOUNTER — Emergency Department (HOSPITAL_COMMUNITY)
Admission: EM | Admit: 2018-09-21 | Discharge: 2018-09-21 | Disposition: A | Discharge status: Home / Self Care | Payer: Self-pay | Attending: Emergency Medicine | Admitting: Emergency Medicine

## 2018-09-21 DIAGNOSIS — F1010 Alcohol abuse, uncomplicated: Secondary | ICD-10-CM

## 2018-09-21 DIAGNOSIS — R45851 Suicidal ideations: Secondary | ICD-10-CM

## 2018-09-21 LAB — COMPREHENSIVE METABOLIC PANEL WITH GFR
ALT: 30 U/L (ref 0–44)
AST: 128 U/L — ABNORMAL HIGH (ref 15–41)
Albumin: 3.9 g/dL (ref 3.5–5.0)
Alkaline Phosphatase: 161 U/L — ABNORMAL HIGH (ref 38–126)
Anion gap: 10 (ref 5–15)
BUN: <5 mg/dL — ABNORMAL LOW (ref 6–20)
CO2: 23 mmol/L (ref 22–32)
Calcium: 8.5 mg/dL — ABNORMAL LOW (ref 8.9–10.3)
Chloride: 109 mmol/L (ref 98–111)
Creatinine, Ser: 0.76 mg/dL (ref 0.61–1.24)
GFR calc Af Amer: >60 mL/min
GFR calc non Af Amer: >60 mL/min
Glucose, Bld: 96 mg/dL (ref 70–99)
Potassium: 3.4 mmol/L — ABNORMAL LOW (ref 3.5–5.1)
Sodium: 142 mmol/L (ref 135–145)
Total Bilirubin: 0.8 mg/dL (ref 0.3–1.2)
Total Protein: 8.5 g/dL — ABNORMAL HIGH (ref 6.5–8.1)

## 2018-09-21 LAB — SALICYLATE LEVEL: Salicylate Lvl: <7 mg/dL (ref 2.8–30.0)

## 2018-09-21 LAB — CBC
HCT: 37 % — ABNORMAL LOW (ref 39.0–52.0)
Hemoglobin: 11.9 g/dL — ABNORMAL LOW (ref 13.0–17.0)
MCH: 29.4 pg (ref 26.0–34.0)
MCHC: 32.2 g/dL (ref 30.0–36.0)
MCV: 91.4 fL (ref 80.0–100.0)
Platelets: 51 10*3/uL — ABNORMAL LOW (ref 150–400)
RBC: 4.05 MIL/uL — ABNORMAL LOW (ref 4.22–5.81)
RDW: 21.6 % — ABNORMAL HIGH (ref 11.5–15.5)
WBC: 3.9 10*3/uL — ABNORMAL LOW (ref 4.0–10.5)
nRBC: 0 % (ref 0.0–0.2)

## 2018-09-21 LAB — RAPID URINE DRUG SCREEN, HOSP PERFORMED
Amphetamines: NOT DETECTED
Barbiturates: NOT DETECTED
Benzodiazepines: NOT DETECTED
Cocaine: NOT DETECTED
Opiates: NOT DETECTED
Tetrahydrocannabinol: NOT DETECTED

## 2018-09-21 LAB — ACETAMINOPHEN LEVEL: Acetaminophen (Tylenol), Serum: <10 ug/mL — ABNORMAL LOW (ref 10–30)

## 2018-09-21 LAB — ETHANOL: Alcohol, Ethyl (B): 344 mg/dL (ref <10)

## 2018-09-21 NOTE — Discharge Instructions (Addendum)
To help you maintain a sober lifestyle, a substance abuse treatment program may be beneficial to you.  Contact one of the following facilities at your earliest opportunity to ask about enrolling:  RESIDENTIAL PROGRAMS:       [facility]      [place], [place] [address]      [phone]       [facility]      [address] [place], [place] [address]      [phone]       [facility]      [place], [place] [address]      [phone]  OUTPATIENT PROGRAMS:       [facility]      [place], [place] [address]      [phone]

## 2018-09-21 NOTE — BH Assessment (Signed)
Tele Assessment Note   Patient Name: Barry Moore MRN: [id number] Referring Physician: ED Physician Location of Patient: [facility] Location of Provider: [place] is an 53 y.o. male. Pt reports previous SI. Pt currently denies SI. Pt denies HI and AVH. Per Pt when he drinks he becomes suicidal. Pt states he drinks beer daily. Pt reports drinking "too much." Pt states he would like to detox from alcohol and enter into a SA program. Pt denies current outpatient treatment. Pt reports multiple inpatient hospitalizations.  Dr. Mariea Clonts and Delphia Grates, NP recommend D/C.  Diagnosis:  F10.20 Alcohol use, severe; F32.9 Depression  Past Medical History:  Past Medical History:  Diagnosis Date  . Alcoholic hepatitis   . Depression   . ETOH abuse   . Gastric bleed 08/2018  . Hypertension   . Pancreatitis   . Seizures ([place])   . Suicidal behavior     Past Surgical History:  Procedure Laterality Date  . BIOPSY  08/05/2018   Procedure: BIOPSY;  Surgeon: Irving Copas., MD;  Location: [place];  Service: Gastroenterology;;  . BIOPSY  08/07/2018   Procedure: BIOPSY;  Surgeon: Thornton Park, MD;  Location: [place];  Service: Gastroenterology;;  . COLONOSCOPY WITH PROPOFOL N/A 08/07/2018   Procedure: COLONOSCOPY WITH PROPOFOL;  Surgeon: Thornton Park, MD;  Location: [place];  Service: Gastroenterology;  Laterality: N/A;  . ESOPHAGOGASTRODUODENOSCOPY (EGD) WITH PROPOFOL N/A 11/02/2016   Procedure: ESOPHAGOGASTRODUODENOSCOPY (EGD) WITH PROPOFOL;  Surgeon: Carol Ada, MD;  [facility]: [facility];  Service: Endoscopy;  Laterality: N/A;  . ESOPHAGOGASTRODUODENOSCOPY (EGD) WITH PROPOFOL N/A 08/05/2018   Procedure: ESOPHAGOGASTRODUODENOSCOPY (EGD) WITH PROPOFOL;  Surgeon: Rush Landmark Telford Nab., MD;  Location: [place];  Service: Gastroenterology;  Laterality: N/A;  . HERNIA REPAIR    . LEG SURGERY    . POLYPECTOMY  08/07/2018   Procedure:  POLYPECTOMY;  Surgeon: Thornton Park, MD;  Location: [facility] ENDOSCOPY;  Service: Gastroenterology;;    Family History:  Family History  Problem Relation Age of Onset  . Diabetes Mother   . Alcoholism Mother   . Emphysema Father   . Lung cancer Father   . Alcoholism Father     Social History:  reports that he has been smoking cigarettes. He has been smoking about 1.00 pack per day. He has never used smokeless tobacco. He reports current alcohol use. He reports previous drug use. Frequency: 3.00 times per week. Drug: Cocaine.  Additional Social History:  Alcohol / Drug Use Pain Medications: please see mar Prescriptions: please see mar Over the Counter: please see mar History of alcohol / drug use?: Yes Longest period of sobriety (when/how long): unknown Negative Consequences of Use: Financial, Legal, Personal relationships, Work / School Substance #1 Name of Substance 1: alcohol 1 - Age of First Use: unknown 1 - Amount (size/oz): unknown 1 - Frequency: unknown 1 - Duration: unknown 1 - Last Use / Amount: unknown  CIWA: CIWA-Ar BP: 124/84 Pulse Rate: 87 COWS:    Allergies:  Allergies  Allergen Reactions  . Tomato Shortness Of Breath and Nausea And Vomiting  . Aspirin Nausea And Vomiting    Home Medications: (Not in a hospital admission)   OB/GYN Status:  No LMP for male patient.  General Assessment Data Assessment unable to be completed: Yes Reason for not completing assessment: Pt BAL 344 at 02:35 [facility] Assessment: [facility] TTS Assessment: In system Is this a Tele or Face-to-Face Assessment?: Tele Assessment Is this an Initial Assessment or a Re-assessment  for this encounter?: Initial Assessment Patient Accompanied by:: N/A Language Other than English: No Living Arrangements: Other (Comment) What gender do you identify as?: Male Marital status: Separated Maiden name: NA Pregnancy Status: No Living Arrangements: Alone Can pt return to current living  arrangement?: No Admission Status: Voluntary Is patient capable of signing voluntary admission?: Yes Referral Source: Self/Family/Friend Insurance type: SP     Crisis Care Plan Living Arrangements: Alone Legal Guardian: Other:(self) Name of Psychiatrist: NA Name of Therapist: NA  Education Status Is patient currently in school?: No Is the patient employed, unemployed or receiving disability?: Unemployed  Risk to self with the past 6 months Suicidal Ideation: No-Not Currently/Within Last 6 Months Has patient been a risk to self within the past 6 months prior to admission? : Yes Suicidal Intent: No-Not Currently/Within Last 6 Months Has patient had any suicidal intent within the past 6 months prior to admission? : No Is patient at risk for suicide?: No Suicidal Plan?: No-Not Currently/Within Last 6 Months Has patient had any suicidal plan within the past 6 months prior to admission? : Yes Specify Current Suicidal Plan: previously to cut wrists Access to Means: No Specify Access to Suicidal Means: access to a knife What has been your use of drugs/alcohol within the last 12 months?: alcohol Previous Attempts/Gestures: Yes How many times?: 1 Other Self Harm Risks: NA Triggers for Past Attempts: Unknown Intentional Self Injurious Behavior: None Family Suicide History: No Recent stressful life event(s): Other (Comment)(SA) Persecutory voices/beliefs?: No Depression: Yes Depression Symptoms: Feeling worthless/self pity, Feeling angry/irritable Substance abuse history and/or treatment for substance abuse?: Yes Suicide prevention information given to non-admitted patients: Not applicable  Risk to Others within the past 6 months Homicidal Ideation: No Does patient have any lifetime risk of violence toward others beyond the six months prior to admission? : No Thoughts of Harm to Others: No Current Homicidal Intent: No Current Homicidal Plan: No Access to Homicidal Means:  No Describe Access to Homicidal Means: NA Identified Victim: NA History of harm to others?: No Assessment of Violence: None Noted Violent Behavior Description: NA Does patient have access to weapons?: No Criminal Charges Pending?: No Does patient have a court date: No Is patient on probation?: No  Psychosis Hallucinations: None noted Delusions: None noted  Mental Status Report Appearance/Hygiene: In scrubs Eye Contact: Fair Motor Activity: Freedom of movement Speech: Logical/coherent Level of Consciousness: Alert Mood: Depressed Affect: Depressed Anxiety Level: Minimal Thought Processes: Coherent, Relevant Judgement: Unimpaired Orientation: Person, Place, Time, Situation Obsessive Compulsive Thoughts/Behaviors: None  Cognitive Functioning Concentration: Normal Memory: Recent Intact, Remote Intact Is patient IDD: No Insight: Fair Impulse Control: Fair Appetite: Fair Have you had any weight changes? : No Change Sleep: Decreased Total Hours of Sleep: 5 Vegetative Symptoms: None  ADLScreening [facility] Assessment Services) Patient's cognitive ability adequate to safely complete daily activities?: Yes Patient able to express need for assistance with ADLs?: Yes Independently performs ADLs?: Yes (appropriate for developmental age)  Prior Inpatient Therapy Prior Inpatient Therapy: Yes Prior Therapy Dates: Per chart, 3 years ago. Prior Therapy Facilty/Provider(s): Per chart, Mental Health in [place], [place] [facility]. Reason for Treatment: Depression, SI.   Prior Outpatient Therapy Prior Outpatient Therapy: No Does patient have an ACCT team?: No Does patient have Intensive In-House Services?  : No Does patient have [facility] services? : No Does patient have P4CC services?: No  ADL Screening (condition at time of admission) Patient's cognitive ability adequate to safely complete daily activities?: Yes Is the patient deaf or have  difficulty hearing?: No Does the patient have  difficulty seeing, even when wearing glasses/contacts?: No Does the patient have difficulty concentrating, remembering, or making decisions?: No Patient able to express need for assistance with ADLs?: Yes Does the patient have difficulty dressing or bathing?: No Independently performs ADLs?: Yes (appropriate for developmental age)       Abuse/Neglect Assessment (Assessment to be complete while patient is alone) Abuse/Neglect Assessment Can Be Completed: Yes Physical Abuse: Denies Verbal Abuse: Denies Sexual Abuse: Denies Exploitation of patient/patient's resources: Denies     Regulatory affairs officer (For Healthcare) Does Patient Have a Medical Advance Directive?: No Would patient like information on creating a medical advance directive?: No - Patient declined          Disposition:  Disposition Initial Assessment Completed for this Encounter: Yes  This service was provided via telemedicine using a 2-way, interactive audio and video technology.  Names of all persons participating in this telemedicine service and their role in this encounter. Name: Dr. Mariea Clonts Role: Physician  Name: Delphia Grates Role: NP  Name:  Role:   Name:  Role:     Phyliss Hulick D 09/21/2018 10:07 AM

## 2018-09-21 NOTE — BH Assessment (Signed)
[facility] Assessment Progress Note  Per Buford Dresser, DO, this pt does not require psychiatric hospitalization at this time.  Pt is to be discharged from [facility] with referral information for area substance abuse treatment providers.  This has been included in pt's discharge instructions.  Pt's nurse has been notified.  Jalene Mullet, [place] Triage Specialist [phone]

## 2018-09-21 NOTE — ED Notes (Signed)
Pt's belongings consist of: Book bag, wallet, cell phone, shoes, socks, pants and shirt.  Pt's belongings placed in cabinets above 19-22 computer. Pt changed into scrubs and resting comfortably.

## 2018-09-21 NOTE — ED Notes (Signed)
Tele-psych consulting with patient at this time. Breakfast tray provided to patient.

## 2018-09-21 NOTE — ED Provider Notes (Signed)
[facility] DEPT Provider Note   CSN: [id number] Arrival date & time: 09/20/18  2157     History   Chief Complaint Chief Complaint  Patient presents with  . Suicidal    HPI Barry Moore is a 53 y.o. male.     Patient with complaint of suicidal thoughts.  He states that he has been drinking too much, this caused him to feel suicidal.  Does not have a clear plan on how he would kill himself.  He denies any auditory or visual hallucinations.  Denies any drug use.  Denies any other associated symptoms.  The history is provided by the patient. No language interpreter was used.    Past Medical History:  Diagnosis Date  . Alcoholic hepatitis   . Depression   . ETOH abuse   . Gastric bleed 08/2018  . Hypertension   . Pancreatitis   . Seizures ([place])   . Suicidal behavior     Patient Active Problem List   Diagnosis Date Noted  . Gastrointestinal hemorrhage with melena 08/31/2018  . Colon ulcer   . Polyp of ascending colon   . Acute GI bleeding 08/05/2018  . Transaminitis 08/05/2018  . Abdominal pain 08/05/2018  . Nausea and vomiting 08/05/2018  . Substance induced mood disorder ([place]) 07/16/2017  . Severe recurrent major depression without psychotic features ([place]) 05/15/2017  . Anemia 03/18/2017  . Acute blood loss anemia 02/21/2017  . UGI bleed 02/08/2017  . Alcohol withdrawal ([place]) 02/08/2017  . Cocaine abuse ([place]) 11/18/2016  . GI bleed 11/01/2016  . Alcohol abuse   . Major depressive disorder, recurrent severe without psychotic features ([place]) 07/06/2016  . 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 Seizure ([place]) 05/26/2016  . Tobacco abuse 05/26/2016  . Homeless 05/26/2016  . Essential hypertension 05/26/2016  . Alcohol dependence with alcohol-induced mood disorder ([place]) 02/14/2016  . Severe alcohol withdrawal without perceptual disturbances without complication ([place]) 96/22/2979  . Alcoholic hepatitis without ascites  02/14/2016  . Thrombocytopenia ([place]) 02/14/2016    Past Surgical History:  Procedure Laterality Date  . BIOPSY  08/05/2018   Procedure: BIOPSY;  Surgeon: Irving Copas., MD;  Location: [place];  Service: Gastroenterology;;  . BIOPSY  08/07/2018   Procedure: BIOPSY;  Surgeon: Thornton Park, MD;  Location: [place];  Service: Gastroenterology;;  . COLONOSCOPY WITH PROPOFOL N/A 08/07/2018   Procedure: COLONOSCOPY WITH PROPOFOL;  Surgeon: Thornton Park, MD;  Location: [place];  Service: Gastroenterology;  Laterality: N/A;  . ESOPHAGOGASTRODUODENOSCOPY (EGD) WITH PROPOFOL N/A 11/02/2016   Procedure: ESOPHAGOGASTRODUODENOSCOPY (EGD) WITH PROPOFOL;  Surgeon: Carol Ada, MD;  [facility];  Service: Endoscopy;  Laterality: N/A;  . ESOPHAGOGASTRODUODENOSCOPY (EGD) WITH PROPOFOL N/A 08/05/2018   Procedure: ESOPHAGOGASTRODUODENOSCOPY (EGD) WITH PROPOFOL;  Surgeon: Rush Landmark Telford Nab., MD;  Location: [place];  Service: Gastroenterology;  Laterality: N/A;  . HERNIA REPAIR    . LEG SURGERY    . POLYPECTOMY  08/07/2018   Procedure: POLYPECTOMY;  Surgeon: Thornton Park, MD;  Location: [facility] ENDOSCOPY;  Service: Gastroenterology;;        Home Medications    Prior to Admission medications   Medication Sig Start Date End Date Taking? Authorizing Provider  ferrous sulfate 325 (65 FE) MG tablet Take 1 tablet (325 mg total) by mouth daily with breakfast. 08/09/18 08/09/19  Elodia Florence., MD  folic acid (FOLVITE) 1 MG tablet Take 1 tablet (1 mg total) by mouth daily. 08/09/18   Elodia Florence., MD  Multiple Vitamin (MULTIVITAMIN WITH MINERALS) TABS tablet Take 1 tablet by mouth daily. 08/09/18   Elodia Florence., MD  pantoprazole (PROTONIX) 40 MG tablet Take 1 tablet (40 mg total) by mouth 2 (two) times daily. 08/09/18   Elodia Florence., MD  thiamine 100 MG tablet Take 1 tablet (100 mg total) by mouth daily. 08/09/18   Elodia Florence., MD    Family History Family History  Problem Relation Age of Onset  . Diabetes Mother   . Alcoholism Mother   . Emphysema Father   . Lung cancer Father   . Alcoholism Father     Social History Social History   Tobacco Use  . Smoking status: Current Every Day Smoker    Packs/day: 1.00    Types: Cigarettes  . Smokeless tobacco: Never Used  Substance Use Topics  . Alcohol use: Yes    Comment: 36+ beers daily  . Drug use: Not Currently    Frequency: 3.0 times per week    Types: Cocaine     Allergies   Tomato and Aspirin   Review of Systems Review of Systems  All other systems reviewed and are negative.    Physical Exam Updated Vital Signs BP (!) 129/93 (BP Location: Left Arm)   Pulse 95   Temp 98.1 F (36.7 C) (Oral)   Resp 18   Ht 5\' 7"  (1.702 m)   Wt 70.3 kg   SpO2 98%   BMI 24.28 kg/m   Physical Exam Vitals signs and nursing note reviewed.  Constitutional:      Appearance: He is well-developed.  HENT:     Head: Normocephalic and atraumatic.  Eyes:     Conjunctiva/sclera: Conjunctivae normal.  Neck:     Musculoskeletal: Neck supple.  Cardiovascular:     Rate and Rhythm: Normal rate and regular rhythm.     Heart sounds: No murmur.  Pulmonary:     Effort: Pulmonary effort is normal. No respiratory distress.     Breath sounds: Normal breath sounds.  Abdominal:     Palpations: Abdomen is soft.     Tenderness: There is no abdominal tenderness.  Skin:    General: Skin is warm and dry.  Neurological:     Mental Status: He is alert and oriented to person, place, and time.  Psychiatric:     Comments: intoxicated      ED Treatments / Results  Labs (all labs ordered are listed, but only abnormal results are displayed) Labs Reviewed  COMPREHENSIVE METABOLIC PANEL  ETHANOL  SALICYLATE LEVEL  ACETAMINOPHEN LEVEL  CBC  RAPID URINE DRUG SCREEN, HOSP PERFORMED    EKG None  Radiology No results found.  Procedures  Procedures (including critical care time)  Medications Ordered in ED Medications - No data to display   Initial Impression / Assessment and Plan / ED Course  I have reviewed the triage vital signs and the nursing notes.  Pertinent labs & imaging results that were available during my care of the patient were reviewed by me and considered in my medical decision making (see chart for details).        Patient here with suicidal thoughts.  Also intoxicated.  VSS.  NAD.   TTS consult pending for suicidal thoughts.   Final Clinical Impressions(s) / ED Diagnoses   Final diagnoses:  Suicidal thoughts  Alcohol abuse    ED Discharge Orders    None       Montine Circle, PA-C 09/21/18  0634    Ripley Fraise, MD 09/21/18 [phone]

## 2018-09-21 NOTE — BH Assessment (Signed)
[facility] Assessment Progress Note   Pt has BAL of 344 at 02:35.  Pt to be assessed when BAL closer to 200.

## 2018-09-21 NOTE — ED Notes (Signed)
Psych NP at bedside

## 2018-09-23 ENCOUNTER — Other Ambulatory Visit: Payer: Self-pay

## 2018-09-23 ENCOUNTER — Emergency Department (HOSPITAL_COMMUNITY)
Admission: EM | Admit: 2018-09-23 | Discharge: 2018-09-24 | Disposition: A | Discharge status: Home / Self Care | Payer: Self-pay | Attending: Emergency Medicine | Admitting: Emergency Medicine

## 2018-09-23 ENCOUNTER — Encounter (HOSPITAL_COMMUNITY): Payer: Self-pay | Admitting: *Deleted

## 2018-09-23 DIAGNOSIS — Z590 Homelessness: Secondary | ICD-10-CM | POA: Insufficient documentation

## 2018-09-23 DIAGNOSIS — F10920 Alcohol use, unspecified with intoxication, uncomplicated: Secondary | ICD-10-CM

## 2018-09-23 DIAGNOSIS — R45851 Suicidal ideations: Secondary | ICD-10-CM | POA: Insufficient documentation

## 2018-09-23 DIAGNOSIS — F1024 Alcohol dependence with alcohol-induced mood disorder: Secondary | ICD-10-CM | POA: Insufficient documentation

## 2018-09-23 DIAGNOSIS — I10 Essential (primary) hypertension: Secondary | ICD-10-CM | POA: Insufficient documentation

## 2018-09-23 DIAGNOSIS — F17210 Nicotine dependence, cigarettes, uncomplicated: Secondary | ICD-10-CM | POA: Insufficient documentation

## 2018-09-23 DIAGNOSIS — R45850 Homicidal ideations: Secondary | ICD-10-CM | POA: Insufficient documentation

## 2018-09-23 LAB — COMPREHENSIVE METABOLIC PANEL
ALT: 29 U/L (ref 0–44)
AST: 125 U/L — ABNORMAL HIGH (ref 15–41)
Albumin: 3.8 g/dL (ref 3.5–5.0)
Alkaline Phosphatase: 176 U/L — ABNORMAL HIGH (ref 38–126)
Anion gap: 10 (ref 5–15)
BUN: <5 mg/dL — ABNORMAL LOW (ref 6–20)
CO2: 22 mmol/L (ref 22–32)
Calcium: 8.7 mg/dL — ABNORMAL LOW (ref 8.9–10.3)
Chloride: 106 mmol/L (ref 98–111)
Creatinine, Ser: 0.63 mg/dL (ref 0.61–1.24)
GFR calc Af Amer: >60 mL/min (ref >60)
GFR calc non Af Amer: >60 mL/min (ref >60)
Glucose, Bld: 117 mg/dL — ABNORMAL HIGH (ref 70–99)
Potassium: 3.5 mmol/L (ref 3.5–5.1)
Sodium: 138 mmol/L (ref 135–145)
Total Bilirubin: 0.7 mg/dL (ref 0.3–1.2)
Total Protein: 8.1 g/dL (ref 6.5–8.1)

## 2018-09-23 LAB — CBC
HCT: 35.9 % — ABNORMAL LOW (ref 39.0–52.0)
Hemoglobin: 11.6 g/dL — ABNORMAL LOW (ref 13.0–17.0)
MCH: 29.7 pg (ref 26.0–34.0)
MCHC: 32.3 g/dL (ref 30.0–36.0)
MCV: 92.1 fL (ref 80.0–100.0)
Platelets: 49 10*3/uL — ABNORMAL LOW (ref 150–400)
RBC: 3.9 MIL/uL — ABNORMAL LOW (ref 4.22–5.81)
RDW: 21.2 % — ABNORMAL HIGH (ref 11.5–15.5)
WBC: 4.3 10*3/uL (ref 4.0–10.5)
nRBC: 0 % (ref 0.0–0.2)

## 2018-09-23 LAB — RAPID URINE DRUG SCREEN, HOSP PERFORMED
Amphetamines: NOT DETECTED
Barbiturates: NOT DETECTED
Benzodiazepines: NOT DETECTED
Cocaine: POSITIVE — AB
Opiates: NOT DETECTED
Tetrahydrocannabinol: NOT DETECTED

## 2018-09-23 LAB — ETHANOL: Alcohol, Ethyl (B): 412 mg/dL (ref <10)

## 2018-09-23 MED ORDER — VITAMIN B-1 100 MG PO TABS
100.0000 mg | ORAL_TABLET | Freq: Every day | ORAL | Status: DC
  Administered 2018-09-23 – 2018-09-24 (×2): 100 mg via ORAL
  Filled 2018-09-23 (×2): qty 1

## 2018-09-23 MED ORDER — LORAZEPAM 1 MG PO TABS: 0.0000 mg | ORAL_TABLET | Freq: Two times a day (BID) | ORAL | Status: DC

## 2018-09-23 MED ORDER — LORAZEPAM 2 MG/ML IJ SOLN: 0.0000 mg | Freq: Four times a day (QID) | INTRAMUSCULAR | Status: DC

## 2018-09-23 MED ORDER — LORAZEPAM 1 MG PO TABS
0.0000 mg | ORAL_TABLET | Freq: Four times a day (QID) | ORAL | Status: DC
  Administered 2018-09-23 – 2018-09-24 (×4): 1 mg via ORAL
  Filled 2018-09-23 (×4): qty 1

## 2018-09-23 MED ORDER — LORAZEPAM 2 MG/ML IJ SOLN: 0.0000 mg | Freq: Two times a day (BID) | INTRAMUSCULAR | Status: DC

## 2018-09-23 MED ORDER — THIAMINE HCL 100 MG/ML IJ SOLN: 100.0000 mg | Freq: Every day | INTRAMUSCULAR | Status: DC

## 2018-09-23 NOTE — ED Triage Notes (Signed)
EMS reports pt is homeless and intoxicated, Pt states he lost count of how much he has had to drink. Ambulatory, recently seen for SI. 140/90-84-20 -98% RA CBG 115

## 2018-09-23 NOTE — ED Notes (Addendum)
Date and time results received: 09/23/18 1:01 PM   Test: Alcohol Critical Value: 72  [place], MD made aware.

## 2018-09-23 NOTE — BH Assessment (Signed)
[place] Assessment Progress Note    TTS cannot perform assessment on a patient with a 412 alcohol level.  He will need to sober some before the assessment can be completed.  TTS will try later.

## 2018-09-23 NOTE — ED Notes (Signed)
Patient awake eating his dinner.  CIWA assessment done.

## 2018-09-23 NOTE — BH Assessment (Signed)
[place] Assessment Progress Note  Case was staffed with Romilda Garret FNP who reccommended patient be observed and monitored

## 2018-09-23 NOTE — ED Notes (Signed)
Pt A&O x 3, no distress noted, calm at present.  Sitter at bedside, monitoring for safety.

## 2018-09-23 NOTE — BH Assessment (Signed)
[place] Assessment Progress Note   Case was staffed with Romilda Garret FNP who reccommended patient be observed and monitored

## 2018-09-23 NOTE — ED Notes (Signed)
Patient ate lunch and now is resting in bed.  He has been calm and cooperative but admitted to provider that he was SI and HI.  Denies AVH.

## 2018-09-23 NOTE — ED Provider Notes (Signed)
[facility] DEPT Provider Note   CSN: [id number] Arrival date & time: 09/23/18  1136    History   Chief Complaint Chief Complaint  Patient presents with  . Alcohol Intoxication    HPI Barry Moore is a 53 y.o. male past medical history of alcohol abuse, hypertension, severe major depression with psychotic features, substance induced mood disorder, presenting to the emergency department voluntarily via EMS with complaint of suicidal and homicidal ideations.  Patient is intoxicated, endorses drinking about five 40 ounce bottles of beer today.  He states he called EMS because he did not want to hurt other people or himself.  He has homicidal thoughts towards a man named Barry Moore.  He states he wants to "hurt him" and then "take him out" before he ends his own life.  He has no particular plan for suicide.  Denies AVH. Of note, patient has been evaluated multiple times, 13 times the last 6 months, majority of the time with similar presentation.     The history is provided by the patient.    Past Medical History:  Diagnosis Date  . Alcoholic hepatitis   . Depression   . ETOH abuse   . Gastric bleed 08/2018  . Hypertension   . Pancreatitis   . Seizures ([place])   . Suicidal behavior     Patient Active Problem List   Diagnosis Date Noted  . Gastrointestinal hemorrhage with melena 08/31/2018  . Colon ulcer   . Polyp of ascending colon   . Acute GI bleeding 08/05/2018  . Transaminitis 08/05/2018  . Abdominal pain 08/05/2018  . Nausea and vomiting 08/05/2018  . Substance induced mood disorder ([place]) 07/16/2017  . Severe recurrent major depression without psychotic features ([place]) 05/15/2017  . Anemia 03/18/2017  . Acute blood loss anemia 02/21/2017  . UGI bleed 02/08/2017  . Alcohol withdrawal ([place]) 02/08/2017  . Cocaine abuse ([place]) 11/18/2016  . GI bleed 11/01/2016  . Alcohol abuse   . Major depressive disorder, recurrent severe without  psychotic features ([place]) 07/06/2016  . Alcohol-induced mood disorder ([place]) 06/15/2016  . Seizure ([place]) 05/26/2016  . Tobacco abuse 05/26/2016  . Homeless 05/26/2016  . Essential hypertension 05/26/2016  . Alcohol dependence with alcohol-induced mood disorder ([place]) 02/14/2016  . Severe alcohol withdrawal without perceptual disturbances without complication ([place]) 50/27/7412  . Alcoholic hepatitis without ascites 02/14/2016  . Thrombocytopenia ([place]) 02/14/2016    Past Surgical History:  Procedure Laterality Date  . BIOPSY  08/05/2018   Procedure: BIOPSY;  Surgeon: Irving Copas., MD;  Location: [facility];  Service: Gastroenterology;;  . BIOPSY  08/07/2018   Procedure: BIOPSY;  Surgeon: Thornton Park, MD;  Location: [facility];  Service: Gastroenterology;;  . COLONOSCOPY WITH PROPOFOL N/A 08/07/2018   Procedure: COLONOSCOPY WITH PROPOFOL;  Surgeon: Thornton Park, MD;  Location: [facility];  Service: Gastroenterology;  Laterality: N/A;  . ESOPHAGOGASTRODUODENOSCOPY (EGD) WITH PROPOFOL N/A 11/02/2016   Procedure: ESOPHAGOGASTRODUODENOSCOPY (EGD) WITH PROPOFOL;  Surgeon: Carol Ada, MD;  Location: [facility];  Service: Endoscopy;  Laterality: N/A;  . ESOPHAGOGASTRODUODENOSCOPY (EGD) WITH PROPOFOL N/A 08/05/2018   Procedure: ESOPHAGOGASTRODUODENOSCOPY (EGD) WITH PROPOFOL;  Surgeon: Rush Landmark Telford Nab., MD;  Location: [facility] [place] [facility];  Service: Gastroenterology;  Laterality: N/A;  . HERNIA REPAIR    . LEG SURGERY    . POLYPECTOMY  08/07/2018   Procedure: POLYPECTOMY;  Surgeon: Thornton Park, MD;  Location: [facility] ENDOSCOPY;  Service: Gastroenterology;;        Home Medications    Prior to  Admission medications   Medication Sig Start Date End Date Taking? Authorizing Provider  ferrous sulfate 325 (65 FE) MG tablet Take 1 tablet (325 mg total) by mouth daily with breakfast. 08/09/18 08/09/19  Elodia Florence., MD  folic acid (FOLVITE) 1 MG tablet Take 1 tablet (1  mg total) by mouth daily. 08/09/18   Elodia Florence., MD  Multiple Vitamin (MULTIVITAMIN WITH MINERALS) TABS tablet Take 1 tablet by mouth daily. 08/09/18   Elodia Florence., MD  pantoprazole (PROTONIX) 40 MG tablet Take 1 tablet (40 mg total) by mouth 2 (two) times daily. 08/09/18   Elodia Florence., MD  thiamine 100 MG tablet Take 1 tablet (100 mg total) by mouth daily. 08/09/18   Elodia Florence., MD    Family History Family History  Problem Relation Age of Onset  . Diabetes Mother   . Alcoholism Mother   . Emphysema Father   . Lung cancer Father   . Alcoholism Father     Social History Social History   Tobacco Use  . Smoking status: Current Every Day Smoker    Packs/day: 1.00    Types: Cigarettes  . Smokeless tobacco: Never Used  Substance Use Topics  . Alcohol use: Yes    Comment: 36+ beers daily  . Drug use: Not Currently    Frequency: 3.0 times per week    Types: Cocaine     Allergies   Tomato and Aspirin   Review of Systems Review of Systems  All other systems reviewed and are negative.    Physical Exam Updated Vital Signs BP 111/76 (BP Location: Left Arm)   Pulse 92   Temp 98.6 F (37 C) (Oral)   Resp 18   Physical Exam Vitals signs and nursing note reviewed.  Constitutional:      General: He is not in acute distress.    Appearance: He is well-developed.  HENT:     Head: Normocephalic and atraumatic.  Eyes:     Conjunctiva/sclera: Conjunctivae normal.  Cardiovascular:     Rate and Rhythm: Normal rate.  Pulmonary:     Effort: Pulmonary effort is normal.  Abdominal:     Palpations: Abdomen is soft.  Skin:    General: Skin is warm.  Neurological:     Mental Status: He is alert.  Psychiatric:        Mood and Affect: Mood normal.        Behavior: Behavior normal.      ED Treatments / Results  Labs (all labs ordered are listed, but only abnormal results are displayed) Labs Reviewed  COMPREHENSIVE METABOLIC PANEL  - Abnormal; Notable for the following components:      Result Value   Glucose, Bld 117 (*)    BUN <5 (*)    Calcium 8.7 (*)    AST 125 (*)    Alkaline Phosphatase 176 (*)    All other components within normal limits  ETHANOL - Abnormal; Notable for the following components:   Alcohol, Ethyl (B) 412 (*)    All other components within normal limits  CBC - Abnormal; Notable for the following components:   RBC 3.90 (*)    Hemoglobin 11.6 (*)    HCT 35.9 (*)    RDW 21.2 (*)    Platelets 49 (*)    All other components within normal limits  RAPID URINE DRUG SCREEN, HOSP PERFORMED - Abnormal; Notable for the following components:   Cocaine POSITIVE (*)  All other components within normal limits    EKG None  Radiology No results found.  Procedures Procedures (including critical care time)  Medications Ordered in ED Medications  LORazepam (ATIVAN) injection 0-4 mg ( Intravenous See Alternative 09/23/18 1634)    Or  LORazepam (ATIVAN) tablet 0-4 mg (1 mg Oral Given 09/23/18 1634)  LORazepam (ATIVAN) injection 0-4 mg (has no administration in time range)    Or  LORazepam (ATIVAN) tablet 0-4 mg (has no administration in time range)  thiamine (VITAMIN B-1) tablet 100 mg (100 mg Oral Given 09/23/18 1640)    Or  thiamine (B-1) injection 100 mg ( Intravenous See Alternative 09/23/18 1640)     Initial Impression / Assessment and Plan / ED Course  I have reviewed the triage vital signs and the nursing notes.  Pertinent labs & imaging results that were available during my care of the patient were reviewed by me and considered in my medical decision making (see chart for details).        Patient presenting voluntarily via EMS with complaint of SI and HI as well as alcohol intoxication.  He states he has homicidal thoughts towards a man named Barry Moore, as well as suicidal thoughts without specific plan.  He states he thinks he is ready to sober up and get help for his alcohol abuse.   Patient is alert and conversant. no medical complaints today.  UDS positive for cocaine, alcohol level 412.  Psych hold orders and CIWA protocol initiated.  TTS to determine disposition.  I spoke with TTS.  Recommends overnight observation.  Agree with assessment.  Psych hold orders in place. ciwa protocol for over night  Final Clinical Impressions(s) / ED Diagnoses   Final diagnoses:  Alcoholic intoxication without complication [facility])  Suicidal ideation  Homicidal ideation    ED Discharge Orders    None       Gedeon Brandow, [place] N, PA-C 09/23/18 1641    Julianne Rice, MD 09/24/18 1757

## 2018-09-23 NOTE — ED Notes (Signed)
Patient asleep.  Did not assess CIWA due to patient sleeping.
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
## 2018-09-24 DIAGNOSIS — R45850 Homicidal ideations: Secondary | ICD-10-CM | POA: Insufficient documentation

## 2018-09-24 DIAGNOSIS — F1490 Cocaine use, unspecified, uncomplicated: Secondary | ICD-10-CM

## 2018-09-24 DIAGNOSIS — F1024 Alcohol dependence with alcohol-induced mood disorder: Secondary | ICD-10-CM

## 2018-09-24 MED ORDER — GABAPENTIN 300 MG PO CAPS
300.0000 mg | ORAL_CAPSULE | Freq: Two times a day (BID) | ORAL | Status: DC
  Administered 2018-09-24: 300 mg via ORAL
  Filled 2018-09-24: qty 1
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
## 2018-09-24 NOTE — ED Notes (Signed)
Pt DCd off unit to home per MD. Pt alert, calm, cooperative, no s/s of distress. Pt DC information reviewed with and given to pt with acknowledged understanding. Belongings given to pt. Pt ambulatory off unit, escort by RN. Pt using bus for transport

## 2018-10-02 ENCOUNTER — Telehealth: Payer: Self-pay | Admitting: General Surgery

## 2018-10-02 NOTE — Telephone Encounter (Signed)
Contacted the patient to Covid screen and his voicemail was not set up.

## 2018-10-03 ENCOUNTER — Ambulatory Visit: Payer: Self-pay | Admitting: Gastroenterology

## 2018-10-08 ENCOUNTER — Emergency Department (HOSPITAL_COMMUNITY)
Admission: EM | Admit: 2018-10-08 | Discharge: 2018-10-09 | Disposition: A | Discharge status: Home / Self Care | Payer: Self-pay | Attending: Emergency Medicine | Admitting: Emergency Medicine

## 2018-10-08 ENCOUNTER — Encounter (HOSPITAL_COMMUNITY): Payer: Self-pay | Admitting: Emergency Medicine

## 2018-10-08 ENCOUNTER — Emergency Department (HOSPITAL_COMMUNITY): Payer: Self-pay

## 2018-10-08 DIAGNOSIS — D649 Anemia, unspecified: Secondary | ICD-10-CM

## 2018-10-08 DIAGNOSIS — D696 Thrombocytopenia, unspecified: Secondary | ICD-10-CM

## 2018-10-08 DIAGNOSIS — Y908 Blood alcohol level of 240 mg/100 ml or more: Secondary | ICD-10-CM | POA: Insufficient documentation

## 2018-10-08 DIAGNOSIS — D509 Iron deficiency anemia, unspecified: Secondary | ICD-10-CM | POA: Insufficient documentation

## 2018-10-08 DIAGNOSIS — Z886 Allergy status to analgesic agent status: Secondary | ICD-10-CM | POA: Insufficient documentation

## 2018-10-08 DIAGNOSIS — M549 Dorsalgia, unspecified: Secondary | ICD-10-CM | POA: Insufficient documentation

## 2018-10-08 DIAGNOSIS — R569 Unspecified convulsions: Secondary | ICD-10-CM

## 2018-10-08 DIAGNOSIS — F10220 Alcohol dependence with intoxication, uncomplicated: Secondary | ICD-10-CM | POA: Insufficient documentation

## 2018-10-08 DIAGNOSIS — F10920 Alcohol use, unspecified with intoxication, uncomplicated: Secondary | ICD-10-CM

## 2018-10-08 DIAGNOSIS — F17210 Nicotine dependence, cigarettes, uncomplicated: Secondary | ICD-10-CM | POA: Insufficient documentation

## 2018-10-08 DIAGNOSIS — I10 Essential (primary) hypertension: Secondary | ICD-10-CM | POA: Insufficient documentation

## 2018-10-08 DIAGNOSIS — R109 Unspecified abdominal pain: Secondary | ICD-10-CM | POA: Insufficient documentation

## 2018-10-08 LAB — URINALYSIS, ROUTINE W REFLEX MICROSCOPIC
Bilirubin Urine: NEGATIVE
Glucose, UA: NEGATIVE mg/dL
Hgb urine dipstick: NEGATIVE
Ketones, ur: NEGATIVE mg/dL
Leukocytes,Ua: NEGATIVE
Nitrite: NEGATIVE
Protein, ur: NEGATIVE mg/dL
Specific Gravity, Urine: 1.003 — ABNORMAL LOW (ref 1.005–1.030)
pH: 6 (ref 5.0–8.0)

## 2018-10-08 LAB — BASIC METABOLIC PANEL
Anion gap: 11 (ref 5–15)
BUN: <5 mg/dL — ABNORMAL LOW (ref 6–20)
CO2: 22 mmol/L (ref 22–32)
Calcium: 8.6 mg/dL — ABNORMAL LOW (ref 8.9–10.3)
Chloride: 101 mmol/L (ref 98–111)
Creatinine, Ser: 0.82 mg/dL (ref 0.61–1.24)
GFR calc Af Amer: >60 mL/min (ref >60)
GFR calc non Af Amer: >60 mL/min (ref >60)
Glucose, Bld: 95 mg/dL (ref 70–99)
Potassium: 3.7 mmol/L (ref 3.5–5.1)
Sodium: 134 mmol/L — ABNORMAL LOW (ref 135–145)

## 2018-10-08 LAB — CBC
HCT: 37.1 % — ABNORMAL LOW (ref 39.0–52.0)
Hemoglobin: 12.5 g/dL — ABNORMAL LOW (ref 13.0–17.0)
MCH: 30.5 pg (ref 26.0–34.0)
MCHC: 33.7 g/dL (ref 30.0–36.0)
MCV: 90.5 fL (ref 80.0–100.0)
Platelets: 49 10*3/uL — ABNORMAL LOW (ref 150–400)
RBC: 4.1 MIL/uL — ABNORMAL LOW (ref 4.22–5.81)
RDW: 18.7 % — ABNORMAL HIGH (ref 11.5–15.5)
WBC: 3.7 10*3/uL — ABNORMAL LOW (ref 4.0–10.5)
nRBC: 0 % (ref 0.0–0.2)

## 2018-10-08 LAB — CK: Total CK: 170 U/L (ref 49–397)

## 2018-10-08 LAB — RAPID URINE DRUG SCREEN, HOSP PERFORMED
Amphetamines: NOT DETECTED
Barbiturates: NOT DETECTED
Benzodiazepines: NOT DETECTED
Cocaine: NOT DETECTED
Opiates: NOT DETECTED
Tetrahydrocannabinol: NOT DETECTED

## 2018-10-08 LAB — ETHANOL: Alcohol, Ethyl (B): 462 mg/dL (ref <10)

## 2018-10-08 LAB — TROPONIN I (HIGH SENSITIVITY): Troponin I (High Sensitivity): 3 ng/L (ref <18)

## 2018-10-08 MED ORDER — SODIUM CHLORIDE 0.9% FLUSH: 3.0000 mL | Freq: Once | INTRAVENOUS | Status: DC

## 2018-10-08 MED ORDER — SODIUM CHLORIDE 0.9 % IV BOLUS
1000.0000 mL | Freq: Once | INTRAVENOUS | Status: AC
  Administered 2018-10-08: 1000 mL via INTRAVENOUS
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
## 2018-10-08 NOTE — ED Notes (Signed)
Pt given urinal, aware that sample is needed.

## 2018-10-08 NOTE — ED Triage Notes (Signed)
Pt transported from sisters home this evening for seizure like activity, EMS reports some tremors but pt remained oriented.  Pt does have etoh w/d seizures but feels like he has consumed enough today (4) 40 oz beers today. Pt does work outside and has not had water. Pt c/o chest pain, back pain, all over muscle pain and generally not feeling well Intravenous fluids were administered by EMS 500cc NS.

## 2018-10-09 NOTE — ED Provider Notes (Signed)
Care assumed from Dr. Venora Maples, patient presenting with seizure-like activity and alcohol intoxication.  Ethanol level was exceedingly high.  Other labs show mild anemia which is unchanged from baseline, and moderately severe thrombocytopenia which is also unchanged from baseline.  These are both felt to be related to alcohol abuse.  Patient was observed overnight and had no seizure-like activity.  At this point, I do not see indication for starting anticonvulsant therapy.  He is ambulated in the ED and discharged.  Given resources for outpatient detox programs.   Delora Fuel, MD 01/60/10 [phone]

## 2018-10-09 NOTE — ED Notes (Signed)
PT states understanding of care given, follow up care. PT ambulated from ED to car with a steady gait.  

## 2018-10-09 NOTE — ED Notes (Signed)
Pt ambulated with no assistance from room to bathroom. Stated that he felt lightheaded when he started walking.

## 2018-10-18 ENCOUNTER — Encounter (HOSPITAL_COMMUNITY): Payer: Self-pay

## 2018-10-18 ENCOUNTER — Other Ambulatory Visit: Payer: Self-pay

## 2018-10-18 ENCOUNTER — Emergency Department (HOSPITAL_COMMUNITY)
Admission: EM | Admit: 2018-10-18 | Discharge: 2018-10-19 | Disposition: A | Discharge status: Home / Self Care | Payer: Self-pay | Attending: Emergency Medicine | Admitting: Emergency Medicine

## 2018-10-18 DIAGNOSIS — F17210 Nicotine dependence, cigarettes, uncomplicated: Secondary | ICD-10-CM | POA: Insufficient documentation

## 2018-10-18 DIAGNOSIS — R45851 Suicidal ideations: Secondary | ICD-10-CM | POA: Insufficient documentation

## 2018-10-18 DIAGNOSIS — I10 Essential (primary) hypertension: Secondary | ICD-10-CM | POA: Insufficient documentation

## 2018-10-18 DIAGNOSIS — F10220 Alcohol dependence with intoxication, uncomplicated: Secondary | ICD-10-CM | POA: Insufficient documentation

## 2018-10-18 DIAGNOSIS — R45850 Homicidal ideations: Secondary | ICD-10-CM | POA: Insufficient documentation

## 2018-10-18 DIAGNOSIS — F339 Major depressive disorder, recurrent, unspecified: Secondary | ICD-10-CM

## 2018-10-18 DIAGNOSIS — D696 Thrombocytopenia, unspecified: Secondary | ICD-10-CM | POA: Insufficient documentation

## 2018-10-18 DIAGNOSIS — F332 Major depressive disorder, recurrent severe without psychotic features: Secondary | ICD-10-CM | POA: Insufficient documentation

## 2018-10-18 LAB — CBC
HCT: 36 % — ABNORMAL LOW (ref 39.0–52.0)
Hemoglobin: 12 g/dL — ABNORMAL LOW (ref 13.0–17.0)
MCH: 30.6 pg (ref 26.0–34.0)
MCHC: 33.3 g/dL (ref 30.0–36.0)
MCV: 91.8 fL (ref 80.0–100.0)
Platelets: 39 10*3/uL — ABNORMAL LOW (ref 150–400)
RBC: 3.92 MIL/uL — ABNORMAL LOW (ref 4.22–5.81)
RDW: 18.3 % — ABNORMAL HIGH (ref 11.5–15.5)
WBC: 4.7 10*3/uL (ref 4.0–10.5)
nRBC: 0 % (ref 0.0–0.2)

## 2018-10-18 LAB — COMPREHENSIVE METABOLIC PANEL
ALT: 30 U/L (ref 0–44)
AST: 126 U/L — ABNORMAL HIGH (ref 15–41)
Albumin: 3.7 g/dL (ref 3.5–5.0)
Alkaline Phosphatase: 175 U/L — ABNORMAL HIGH (ref 38–126)
Anion gap: 12 (ref 5–15)
BUN: <5 mg/dL — ABNORMAL LOW (ref 6–20)
CO2: 23 mmol/L (ref 22–32)
Calcium: 8.8 mg/dL — ABNORMAL LOW (ref 8.9–10.3)
Chloride: 103 mmol/L (ref 98–111)
Creatinine, Ser: 0.58 mg/dL — ABNORMAL LOW (ref 0.61–1.24)
GFR calc Af Amer: >60 mL/min (ref >60)
GFR calc non Af Amer: >60 mL/min (ref >60)
Glucose, Bld: 102 mg/dL — ABNORMAL HIGH (ref 70–99)
Potassium: 3.3 mmol/L — ABNORMAL LOW (ref 3.5–5.1)
Sodium: 138 mmol/L (ref 135–145)
Total Bilirubin: 1 mg/dL (ref 0.3–1.2)
Total Protein: 8.1 g/dL (ref 6.5–8.1)

## 2018-10-18 LAB — SALICYLATE LEVEL: Salicylate Lvl: <7 mg/dL (ref 2.8–30.0)

## 2018-10-18 LAB — RAPID URINE DRUG SCREEN, HOSP PERFORMED
Amphetamines: NOT DETECTED
Barbiturates: NOT DETECTED
Benzodiazepines: NOT DETECTED
Cocaine: NOT DETECTED
Opiates: NOT DETECTED
Tetrahydrocannabinol: NOT DETECTED

## 2018-10-18 LAB — ETHANOL: Alcohol, Ethyl (B): 434 mg/dL (ref <10)

## 2018-10-18 LAB — ACETAMINOPHEN LEVEL: Acetaminophen (Tylenol), Serum: <10 ug/mL — ABNORMAL LOW (ref 10–30)

## 2018-10-18 NOTE — ED Triage Notes (Signed)
Patient brought in by EMS with c/o of suicidal and homicidal ideations. Per patient, he "wants to kill a *expletive*" (one of his friends and himself). Per ems pt c/o of weakness, headache, and abd pain. Had 4 beers today, ETOH.

## 2018-10-19 DIAGNOSIS — Z886 Allergy status to analgesic agent status: Secondary | ICD-10-CM

## 2018-10-19 DIAGNOSIS — R45850 Homicidal ideations: Secondary | ICD-10-CM

## 2018-10-19 DIAGNOSIS — Z91018 Allergy to other foods: Secondary | ICD-10-CM

## 2018-10-19 DIAGNOSIS — F332 Major depressive disorder, recurrent severe without psychotic features: Secondary | ICD-10-CM

## 2018-10-19 DIAGNOSIS — F17210 Nicotine dependence, cigarettes, uncomplicated: Secondary | ICD-10-CM

## 2018-10-19 DIAGNOSIS — R45851 Suicidal ideations: Secondary | ICD-10-CM

## 2018-10-19 DIAGNOSIS — Z915 Personal history of self-harm: Secondary | ICD-10-CM

## 2018-10-19 DIAGNOSIS — F1490 Cocaine use, unspecified, uncomplicated: Secondary | ICD-10-CM

## 2018-10-19 DIAGNOSIS — Y908 Blood alcohol level of 240 mg/100 ml or more: Secondary | ICD-10-CM

## 2018-10-19 DIAGNOSIS — F1024 Alcohol dependence with alcohol-induced mood disorder: Secondary | ICD-10-CM

## 2018-10-19 MED ORDER — THIAMINE HCL 100 MG/ML IJ SOLN: 100.0000 mg | Freq: Every day | INTRAMUSCULAR | Status: DC

## 2018-10-19 MED ORDER — LORAZEPAM 2 MG/ML IJ SOLN: 0.0000 mg | Freq: Four times a day (QID) | INTRAMUSCULAR | Status: DC

## 2018-10-19 MED ORDER — PANTOPRAZOLE SODIUM 20 MG PO TBEC
20.0000 mg | DELAYED_RELEASE_TABLET | Freq: Two times a day (BID) | ORAL | Status: DC
  Administered 2018-10-19: 20 mg via ORAL
  Filled 2018-10-19: qty 1

## 2018-10-19 MED ORDER — LORAZEPAM 2 MG/ML IJ SOLN: 0.0000 mg | Freq: Two times a day (BID) | INTRAMUSCULAR | Status: DC

## 2018-10-19 MED ORDER — NICOTINE 21 MG/24HR TD PT24
21.0000 mg | MEDICATED_PATCH | Freq: Every day | TRANSDERMAL | Status: DC
  Filled 2018-10-19: qty 1

## 2018-10-19 MED ORDER — VITAMIN B-1 100 MG PO TABS
100.0000 mg | ORAL_TABLET | Freq: Every day | ORAL | Status: DC
  Administered 2018-10-19: 100 mg via ORAL
  Filled 2018-10-19: qty 1

## 2018-10-19 MED ORDER — LORAZEPAM 1 MG PO TABS: 0.0000 mg | ORAL_TABLET | Freq: Two times a day (BID) | ORAL | Status: DC

## 2018-10-19 MED ORDER — POTASSIUM CHLORIDE CRYS ER 20 MEQ PO TBCR
40.0000 meq | EXTENDED_RELEASE_TABLET | Freq: Once | ORAL | Status: AC
  Administered 2018-10-19: 40 meq via ORAL
  Filled 2018-10-19: qty 2

## 2018-10-19 MED ORDER — ALUM & MAG HYDROXIDE-SIMETH 200-200-20 MG/5ML PO SUSP: 30.0000 mL | Freq: Four times a day (QID) | ORAL | Status: DC | PRN

## 2018-10-19 MED ORDER — LORAZEPAM 1 MG PO TABS
0.0000 mg | ORAL_TABLET | Freq: Four times a day (QID) | ORAL | Status: DC
  Administered 2018-10-19: 2 mg via ORAL
  Filled 2018-10-19: qty 2

## 2018-10-19 NOTE — ED Notes (Signed)
Patient was given Barry Moore and [employer]inger [employer] for [employer]

## 2018-10-19 NOTE — Consult Note (Addendum)
Telepsych Consultation   Reason for Consult:  SI and HI Referring Physician:  EDP Location of Patient: [facility] ED 051C Location of Provider: [facility]  Patient Identification: Detron Carras MRN:  [id number] Principal Diagnosis: <principal problem not specified> Diagnosis:  Active Problems:   * No active hospital problems. *   Total Time spent with patient: 15 minutes  Subjective: " I feel ok."    HPI: Khristian Phillippi is a 53 y.o. male who was brought to [facility] by EMS due to complaints of SI and HI; pt shared with clinician that "either way, it doesn't matter to me." Pt acknowledged he has been experiencing SI and that the last time he felt this way was one month ago; he states he does not currently have a plan but that he has been hospitalized at behavioral health several times in the past. Pt states he attempted to kill himself on one occasion several years ago by o/d on his lithium. Pt endorsed HI of one of his friends "of a long time" but did not elaborate as to why; pt stated he has never harmed anyone in the past and stated that he would rather not harm anyone, as he'd rather just stay away from the friend so he doesn't have to deal with the situation. Pt denied VH, NSSIB, engagement in the legal system, and access to guns/weapons. Pt shares he has been experiencing AH in the form of hearing birds and "feeling like someone is talking to him; hearing it but no one is there." Pt shares he has been drinking EtOH daily and using cocaine approximately 1x every two weeks. Pt's BAL was 434 when it was taken.  Pt stated he would like to seek treatment for SA and for his depression and expressed a willingness to sign himself in. Clinician shared that, if pt were to address his MH, it would be easier for pt to then take care of his SA, and pt expressed an understanding that this was so.  Pt denied he has anyone for clinician to contact for collateral.  Psychiatric consultation: Mr.  Stanislawski is a 53 y.o. male who was taken to [facility] by EMS due to complaints of SI and HI. During this evaluation, he is alert and oriented x3, calm and cooperative. He denies suicidal thoughts with plan or intent. He states," I don't want to hurt myself. I just don't care if I get hurt."  He continues to endorse homicidal thoughts towards a, " use to be friend" although he denies pLAN OR intent. He states this frined is bothering another frined that makes him upset. He states, " I know to just walk away from he situation if I see him. Patient would not give the name of the individaul. He endorse hearing voices although describes voices as, " hearing someone call my name and when I turn over, no one is there. He denies command or auditory hallucinations. He does have an extensive psychiatric history which includes numerous admissions to [facility] and ED psychiatric evaluations. He has a history of EtOH and cocaine abuse with a most recent ETOH level of 434. His UDS was negative. He denies any withdrawal symptoms at this time. He does request resources for substance abuse treatment. He reports he has not followed up with  outpatient psychiatric services.   Past Psychiatric History: Polysubstance abuse.   Risk to Self: Suicidal Ideation: Yes-Currently Present Suicidal Intent: Yes-Currently Present Is patient at risk for suicide?: Yes Suicidal Plan?: No Specify Current Suicidal  Plan: Pt denies currently having a suicidal plan Access to Means: No What has been your use of drugs/alcohol within the last 12 months?: Pt acknowledges current EtOH and cocaine use How many times?: 1 Other Self Harm Risks: None noted Triggers for Past Attempts: Unknown Intentional Self Injurious Behavior: None Risk to Others: Homicidal Ideation: Yes-Currently Present Thoughts of Harm to Others: Yes-Currently Present Comment - Thoughts of Harm to Others: Pt is having thoughts of harming one of his friends Current Homicidal Intent:  No Current Homicidal Plan: No Access to Homicidal Means: No Describe Access to Homicidal Means: N/A Identified Victim: One of pt's friends History of harm to others?: No Assessment of Violence: On admission Violent Behavior Description: N/A Does patient have access to weapons?: No(Pt denies access to guns/weapons) Criminal Charges Pending?: No Does patient have a court date: No Prior Inpatient Therapy: Prior Inpatient Therapy: Yes Prior Therapy Dates: 2019 Prior Therapy Facilty/Provider(s): [facility], [place] Reason for Treatment: [place] Prior Outpatient Therapy: Prior Outpatient Therapy: No Does patient have an ACCT team?: No Does patient have Intensive In-House Services?  : No Does patient have [facility] services? : No Does patient have P4CC services?: No  Past Medical History:  Past Medical History:  Diagnosis Date  . Alcoholic hepatitis   . Depression   . ETOH abuse   . Gastric bleed 08/2018  . Hypertension   . Pancreatitis   . Seizures ([place])   . Suicidal behavior     Past Surgical History:  Procedure Laterality Date  . BIOPSY  08/05/2018   Procedure: BIOPSY;  Surgeon: Irving Copas., MD;  Location: [place];  Service: Gastroenterology;;  . BIOPSY  08/07/2018   Procedure: BIOPSY;  Surgeon: Thornton Park, MD;  Location: [place];  Service: Gastroenterology;;  . COLONOSCOPY WITH PROPOFOL N/A 08/07/2018   Procedure: COLONOSCOPY WITH PROPOFOL;  Surgeon: Thornton Park, MD;  Location: [place];  Service: Gastroenterology;  Laterality: N/A;  . ESOPHAGOGASTRODUODENOSCOPY (EGD) WITH PROPOFOL N/A 11/02/2016   Procedure: ESOPHAGOGASTRODUODENOSCOPY (EGD) WITH PROPOFOL;  Surgeon: Carol Ada, MD;  Location: [facility] ENDOSCOPY;  Service: Endoscopy;  Laterality: N/A;  . ESOPHAGOGASTRODUODENOSCOPY (EGD) WITH PROPOFOL N/A 08/05/2018   Procedure: ESOPHAGOGASTRODUODENOSCOPY (EGD) WITH PROPOFOL;  Surgeon: Rush Landmark Telford Nab., MD;  Location: [place];  Service: Gastroenterology;  Laterality: N/A;  . HERNIA REPAIR    . LEG SURGERY    . POLYPECTOMY  08/07/2018   Procedure: POLYPECTOMY;  Surgeon: Thornton Park, MD;  Location: [facility] ENDOSCOPY;  Service: Gastroenterology;;   Family History:  Family History  Problem Relation Age of Onset  . Diabetes Mother   . Alcoholism Mother   . Emphysema Father   . Lung cancer Father   . Alcoholism Father    Family Psychiatric  History: Alcoholism  Social History:  Social History   Substance and Sexual Activity  Alcohol Use Yes   Comment: 5+ BEERS DAILY     Social History   Substance and Sexual Activity  Drug Use Not Currently  . Frequency: 3.0 times per week  . Types: Cocaine    Social History   Socioeconomic History  . Marital status: Divorced    Spouse name: Not on file  . Number of children: Not on file  . Years of education: Not on file  . Highest education level: Not on file  Occupational History  . Not on file  Social Needs  . Financial resource strain: Not on file  . Food insecurity    Worry: Not on  file    Inability: Not on file  . Transportation needs    Medical: Not on file    Non-medical: Not on file  Tobacco Use  . Smoking status: Current Every Day Smoker    Packs/day: 1.00    Types: Cigarettes  . Smokeless tobacco: Never Used  Substance and Sexual Activity  . Alcohol use: Yes    Comment: 5+ BEERS DAILY  . Drug use: Not Currently    Frequency: 3.0 times per week    Types: Cocaine  . Sexual activity: Not Currently  Lifestyle  . Physical activity    Days per week: Not on file    Minutes per session: Not on file  . Stress: Not on file  Relationships  . Social Herbalist on phone: Not on file    Gets together: Not on file    Attends religious service: Not on file    Active member of club or organization: Not on file    Attends meetings of clubs or organizations: Not on file    Relationship status: Not on file  Other Topics Concern   . Not on file  Social History Narrative  . Not on file   Additional Social History:    Allergies:   Allergies  Allergen Reactions  . Tomato Shortness Of Breath and Nausea And Vomiting  . Aspirin Nausea And Vomiting    Labs:  Results for orders placed or performed during the hospital encounter of 10/18/18 (from the past 48 hour(s))  Comprehensive metabolic panel     Status: Abnormal   Collection Time: 10/18/18  9:58 PM  Result Value Ref Range   Sodium 138 135 - 145 mmol/L   Potassium 3.3 (L) 3.5 - 5.1 mmol/L   Chloride 103 98 - 111 mmol/L   CO2 23 22 - 32 mmol/L   Glucose, Bld 102 (H) 70 - 99 mg/dL   BUN <5 (L) 6 - 20 mg/dL   Creatinine, Ser 0.58 (L) 0.61 - 1.24 mg/dL   Calcium 8.8 (L) 8.9 - 10.3 mg/dL   Total Protein 8.1 6.5 - 8.1 g/dL   Albumin 3.7 3.5 - 5.0 g/dL   AST 126 (H) 15 - 41 U/L   ALT 30 0 - 44 U/L   Alkaline Phosphatase 175 (H) 38 - 126 U/L   Total Bilirubin 1.0 0.3 - 1.2 mg/dL   GFR calc non Af Amer >60 >60 mL/min   GFR calc Af Amer >60 >60 mL/min   Anion gap 12 5 - 15    Comment: Performed at [facility], [address] [place], [place] [address]  cbc     Status: Abnormal   Collection Time: 10/18/18  9:58 PM  Result Value Ref Range   WBC 4.7 4.0 - 10.5 K/uL   RBC 3.92 (L) 4.22 - 5.81 MIL/uL   Hemoglobin 12.0 (L) 13.0 - 17.0 g/dL   HCT 36.0 (L) 39.0 - 52.0 %   MCV 91.8 80.0 - 100.0 fL   MCH 30.6 26.0 - 34.0 pg   MCHC 33.3 30.0 - 36.0 g/dL   RDW 18.3 (H) 11.5 - 15.5 %   Platelets 39 (L) 150 - 400 K/uL    Comment: Immature Platelet Fraction may be clinically indicated, consider ordering this additional test [id number] CONSISTENT WITH PREVIOUS RESULT    nRBC 0.0 0.0 - 0.2 %    Comment: Performed at [facility], [place] [address] [place], [place] [address]  Ethanol  Status: Abnormal   Collection Time: 10/18/18  9:59 PM  Result Value Ref Range   Alcohol, Ethyl (B) 434 (HH) <10 mg/dL    Comment: CRITICAL RESULT CALLED TO, READ BACK  BY AND VERIFIED WITH: Santa Genera 10/18/18 2257 WAYK Performed at [facility], [address], [place], [place] [address]   Salicylate level     Status: None   Collection Time: 10/18/18  9:59 PM  Result Value Ref Range   Salicylate Lvl <6.0 2.8 - 30.0 mg/dL    Comment: Performed at [place] [address] [place], [place] [address]  Acetaminophen level     Status: Abnormal   Collection Time: 10/18/18  9:59 PM  Result Value Ref Range   Acetaminophen (Tylenol), Serum <10 (L) 10 - 30 ug/mL    Comment: (NOTE) Therapeutic concentrations vary significantly. A range of 10-30 ug/mL  may be an effective concentration for many patients. However, some  are best treated at concentrations outside of this range. Acetaminophen concentrations >150 ug/mL at 4 hours after ingestion  and >50 ug/mL at 12 hours after ingestion are often associated with  toxic reactions. Performed at [facility], [place] [address] [place], [place] [address]   Rapid urine drug screen (hospital performed)     Status: None   Collection Time: 10/18/18 10:02 PM  Result Value Ref Range   Opiates NONE DETECTED NONE DETECTED   Cocaine NONE DETECTED NONE DETECTED   Benzodiazepines NONE DETECTED NONE DETECTED   Amphetamines NONE DETECTED NONE DETECTED   Tetrahydrocannabinol NONE DETECTED NONE DETECTED   Barbiturates NONE DETECTED NONE DETECTED    Comment: (NOTE) DRUG SCREEN FOR MEDICAL PURPOSES ONLY.  IF CONFIRMATION IS NEEDED FOR ANY PURPOSE, NOTIFY LAB WITHIN 5 DAYS. LOWEST DETECTABLE LIMITS FOR URINE DRUG SCREEN Drug Class                     Cutoff (ng/mL) Amphetamine and metabolites    1000 Barbiturate and metabolites    200 Benzodiazepine                 478 Tricyclics and metabolites     300 Opiates and metabolites        300 Cocaine and metabolites        300 THC                            50 Performed at [facility], [place] [address] [place],  [address]      Medications:  Current Facility-Administered Medications  Medication Dose Route Frequency Provider Last Rate Last Dose  . alum & mag hydroxide-simeth (MAALOX/MYLANTA) 200-200-20 MG/5ML suspension 30 mL  30 mL Oral Q6H PRN McDonald, Mia A, PA-C      . LORazepam (ATIVAN) injection 0-4 mg  0-4 mg Intravenous Q6H McDonald, Mia A, PA-C       Or  . LORazepam (ATIVAN) tablet 0-4 mg  0-4 mg Oral Q6H McDonald, Mia A, PA-C   2 mg at 10/19/18 0759  . [START ON 10/21/2018] LORazepam (ATIVAN) injection 0-4 mg  0-4 mg Intravenous Q12H McDonald, Mia A, PA-C       Or  . [START ON 10/21/2018] LORazepam (ATIVAN) tablet 0-4 mg  0-4 mg Oral Q12H McDonald, Mia A, PA-C      . nicotine (NICODERM CQ - dosed in mg/24 hours) patch 21 mg  21 mg Transdermal Daily McDonald, Mia  A, PA-C      . pantoprazole (PROTONIX) EC tablet 20 mg  20 mg Oral BID McDonald, Mia A, PA-C   20 mg at 10/19/18 0759  . potassium chloride SA (K-DUR) CR tablet 40 mEq  40 mEq Oral Once [place], Alyssa B, PA-C      . thiamine (VITAMIN B-1) tablet 100 mg  100 mg Oral Daily McDonald, Mia A, PA-C       Or  . thiamine (B-1) injection 100 mg  100 mg Intravenous Daily McDonald, Mia A, PA-C       No current outpatient medications on file.    Musculoskeletal: Unable to access as evaluation via telepsych   Psychiatric Specialty Exam: Physical Exam  Nursing note reviewed. Constitutional: He is oriented to person, place, and time.  Neurological: He is alert and oriented to person, place, and time.    Review of Systems  Psychiatric/Behavioral: Positive for substance abuse. Negative for depression, hallucinations, memory loss and suicidal ideas. The patient is not nervous/anxious and does not have insomnia.   All other systems reviewed and are negative.   Blood pressure 140/84, pulse 84, temperature 98.2 F (36.8 C), temperature source Oral, resp. rate 18, height 5\' 8"  (1.727 m), weight 69.9 kg, SpO2 95 %.Body mass index is 23.42 kg/m.  General  Appearance: Fairly Groomed  Eye Contact:  Fair  Speech:  Clear and Coherent and Normal Rate  Volume:  Normal  Mood:  Depressed  Affect:  Constricted  Thought Process:  Coherent, Linear and Descriptions of Associations: Intact  Orientation:  Full (Time, Place, and Person)  Thought Content:  WDL  Suicidal Thoughts:  No  Homicidal Thoughts:  Yes.  without intent/plan  Memory:  Immediate;   Fair Recent;   Fair  Judgement:  Impaired  Insight:  Shallow  Psychomotor Activity:  Normal  Concentration:  Concentration: Fair and Attention Span: Fair  Recall:  [employer] of Knowledge:  Fair  Language:  Good  Akathisia:  Negative  Handed:  Right  AIMS (if indicated):     Assets:  Communication Skills Resilience  ADL's:  Intact  Cognition:  WNL  Sleep:        Treatment Plan Summary: Daily contact with patient to assess and evaluate symptoms and progress in treatment  Disposition: No evidence of imminent risk to self or others at present.   Patient does not meet criteria for psychiatric inpatient admission. Will speak with CSW about sending resources for substance abuse treatment.  Patient has been instructed & cautioned: To not engage in alcohol and or illegal drug use. In the event of worsening symptoms, patient is instructed to call the crisis hotline, 911 and or go to the nearest ED for appropriate evaluation and treatment of symptoms.   Emmy patients ED nurse updated on current disposition.   This service was provided via telemedicine using a 2-way, interactive audio and video technology.  Names of all persons participating in this telemedicine service and their role in this encounter. Name: Mordecai Maes  Role: FNP  Name: [name]  Role: Patient    Mordecai Maes, NP 10/19/2018 9:37 AM

## 2018-10-19 NOTE — Progress Notes (Signed)
Patient recommended for d/c.  Substance abuse resources faxed to unit.  CSW recommends a consult to Peer Support for additional SA resources.  Areatha Keas. Judi Cong, MSW, [facility] Clinical Social Work [phone] (cell) [phone] (office)

## 2018-10-19 NOTE — ED Notes (Signed)
Discharge instructions and peer support resources discussed with patient

## 2018-10-19 NOTE — Discharge Instructions (Addendum)
Please follow-up as soon as possible with your primary care provider to get repeat lab work.  We want to make sure that you are platelet count is not dropping.  Please review the resources provided to you by peers support.  Please come back to the emergency department if you develop any worsening symptoms, thoughts of hurting yourself or others, or any new emergent conditions.   Thank you for allowing [name] to participate in your care today.

## 2018-10-19 NOTE — ED Provider Notes (Signed)
Default Provider Psychiatric Rounding Note  This is a 53 yo male with a PMH as below presenting for suicidal ideation/homicidal ideation, alcohol intoxication, and reported seizure like activity. Neuro intact on his evaluation and no signs of seizures.   Past Medical History:  Diagnosis Date  . Alcoholic hepatitis   . Depression   . ETOH abuse   . Gastric bleed 08/2018  . Hypertension   . Pancreatitis   . Seizures ([place])   . Suicidal behavior    Physical Exam:   Vitals:   10/18/18 2139 10/19/18 0648  BP: 106/73 140/84  Pulse: 88 84  Resp: 16 18  Temp: 98.7 F (37.1 C) 98.2 F (36.8 C)  SpO2: 96% 95%   Patient is calm, comfortable, and cooperative.  In no distress.  Resting comfortably with normal respiratory effort.  Expressing no suicidal or homicidal ideations today and clinically sober.  No signs or symptoms of withdrawal.  Plan:   TTS recommending AM psych reevaluation. Will await recommendations.   On CIWA protocol. Also receiving protonix due to history of GI bleed. Hgb stable and no signs of active bleeding.  Patient was instructed to follow-up with his PCP regarding thrombocytopenia.  Patient was medically cleared by psychiatry and is not meeting inpatient criteria.  Patient reports that he has a home to go back to and can take the bus there.  Patient given resources by peers support for alcohol abuse.  Patient discharged in good condition. All concerns addressed.    Albesa Seen, PA-C 10/19/18 1359    Malvin Johns, MD 10/19/18 [phone]

## 2018-10-19 NOTE — BH Assessment (Addendum)
Tele Assessment Note   Patient Name: Barry Moore MRN: [id number] Referring Physician: Joline Maxcy, PA-C Location of Patient: [name] Location of Provider: Henry is a 53 y.o. male who was brought to [facility] by EMS due to complaints of SI and HI; pt shared with clinician that "either way, it doesn't matter to me." Pt acknowledged he has been experiencing SI and that the last time he felt this way was one month ago; he states he does not currently have a plan but that he has been hospitalized at behavioral health several times in the past. Pt states he attempted to kill himself on one occasion several years ago by o/d on his lithium. Pt endorsed HI of one of his friends "of a long time" but did not elaborate as to why; pt stated he has never harmed anyone in the past and stated that he would rather not harm anyone, as he'd rather just stay away from the friend so he doesn't have to deal with the situation. Pt denied VH, NSSIB, engagement in the legal system, and access to guns/weapons. Pt shares he has been experiencing AH in the form of hearing birds and "feeling like someone is talking to him; hearing it but no one is there." Pt shares he has been drinking EtOH daily and using cocaine approximately 1x every two weeks. Pt's BAL was 434 when it was taken.  Pt stated he would like to seek treatment for SA and for his depression and expressed a willingness to sign himself in. Clinician shared that, if pt were to address his MH, it would be easier for pt to then take care of his SA, and pt expressed an understanding that this was so.  Pt denied he has anyone for clinician to contact for collateral.  Pt is oriented x4. His recent and remote memory is impaired. Pt was initially gruff during the assessment but slowly warmed up and became pleasant and cooperative throughout the remainder of the assessment. Pt's insight, judgement, and impulse control is impaired  at this time.   Diagnosis: F33.2, Major depressive disorder, Recurrent episode, Severe; F10.24, Alcohol-induced bipolar and related disorder, With moderate or severe use disorder   Past Medical History:  Past Medical History:  Diagnosis Date  . Alcoholic hepatitis   . Depression   . ETOH abuse   . Gastric bleed 08/2018  . Hypertension   . Pancreatitis   . Seizures ([place])   . Suicidal behavior     Past Surgical History:  Procedure Laterality Date  . BIOPSY  08/05/2018   Procedure: BIOPSY;  Surgeon: Irving Copas., MD;  Location: [place];  Service: Gastroenterology;;  . BIOPSY  08/07/2018   Procedure: BIOPSY;  Surgeon: Thornton Park, MD;  Location: [place];  Service: Gastroenterology;;  . COLONOSCOPY WITH PROPOFOL N/A 08/07/2018   Procedure: COLONOSCOPY WITH PROPOFOL;  Surgeon: Thornton Park, MD;  Location: [place];  Service: Gastroenterology;  Laterality: N/A;  . ESOPHAGOGASTRODUODENOSCOPY (EGD) WITH PROPOFOL N/A 11/02/2016   Procedure: ESOPHAGOGASTRODUODENOSCOPY (EGD) WITH PROPOFOL;  Surgeon: Carol Ada, MD;  Location: [facility];  Service: Endoscopy;  Laterality: N/A;  . ESOPHAGOGASTRODUODENOSCOPY (EGD) WITH PROPOFOL N/A 08/05/2018   Procedure: ESOPHAGOGASTRODUODENOSCOPY (EGD) WITH PROPOFOL;  Surgeon: Rush Landmark Telford Nab., MD;  Location: [place];  Service: Gastroenterology;  Laterality: N/A;  . HERNIA REPAIR    . LEG SURGERY    . POLYPECTOMY  08/07/2018   Procedure: POLYPECTOMY;  Surgeon: Thornton Park, MD;  Location: [facility]  ENDOSCOPY;  Service: Gastroenterology;;    Family History:  Family History  Problem Relation Age of Onset  . Diabetes Mother   . Alcoholism Mother   . Emphysema Father   . Lung cancer Father   . Alcoholism Father     Social History:  reports that he has been smoking cigarettes. He has been smoking about 1.00 pack per day. He has never used smokeless tobacco. He reports current alcohol use. He reports previous  drug use. Frequency: 3.00 times per week. Drug: Cocaine.  Additional Social History:  Alcohol / Drug Use Pain Medications: Please see MAR Prescriptions: Please see MAR Over the Counter: Please see MAR History of alcohol / drug use?: Yes Longest period of sobriety (when/how long): Unknown Substance #1 Name of Substance 1: EtOH 1 - Age of First Use: 11 1 - Amount (size/oz): 6-7 40oz cans 1 - Frequency: Daily 1 - Duration: Unknown 1 - Last Use / Amount: 10/18/2018 Substance #2 Name of Substance 2: Cocaine 2 - Age of First Use: 49 2 - Amount (size/oz): "A 20-piece" 2 - Frequency: 2x/month 2 - Duration: Unknown 2 - Last Use / Amount: 2 weeks ago  CIWA: CIWA-Ar BP: 106/73 Pulse Rate: 88 Nausea and Vomiting: no nausea and no vomiting Tactile Disturbances: none Tremor: no tremor Auditory Disturbances: not present Paroxysmal Sweats: no sweat visible Visual Disturbances: not present Anxiety: no anxiety, at ease Headache, Fullness in Head: none present Agitation: normal activity Orientation and Clouding of Sensorium: oriented and can do serial additions CIWA-Ar Total: 0 COWS:    Allergies:  Allergies  Allergen Reactions  . Tomato Shortness Of Breath and Nausea And Vomiting  . Aspirin Nausea And Vomiting    Home Medications: (Not in a hospital admission)   OB/GYN Status:  No LMP for male patient.  General Assessment Data Location of Assessment: [facility] ED TTS Assessment: In system Is this a Tele or Face-to-Face Assessment?: Tele Assessment Is this an Initial Assessment or a Re-assessment for this encounter?: Initial Assessment Patient Accompanied by:: N/A Language Other than English: No Living Arrangements: Other (Comment)(Pt lives alone in his own home) What gender do you identify as?: Male Marital status: Separated Maiden name: Meske Pregnancy Status: No Living Arrangements: Alone Can pt return to current living arrangement?: Yes Admission Status:  Involuntary Petitioner: ED Attending Is patient capable of signing voluntary admission?: Yes Referral Source: MD Insurance type: None     Crisis Care Plan Living Arrangements: Alone Legal Guardian: Other:(Self) Name of Psychiatrist: None Name of Therapist: None  Education Status Is patient currently in school?: No Is the patient employed, unemployed or receiving disability?: Unemployed  Risk to self with the past 6 months Suicidal Ideation: Yes-Currently Present Has patient been a risk to self within the past 6 months prior to admission? : Yes Suicidal Intent: Yes-Currently Present Has patient had any suicidal intent within the past 6 months prior to admission? : Yes Is patient at risk for suicide?: Yes Suicidal Plan?: No Has patient had any suicidal plan within the past 6 months prior to admission? : Yes Specify Current Suicidal Plan: Pt denies currently having a suicidal plan Access to Means: No What has been your use of drugs/alcohol within the last 12 months?: Pt acknowledges current EtOH and cocaine use Previous Attempts/Gestures: Yes How many times?: 1 Other Self Harm Risks: None noted Triggers for Past Attempts: Unknown Intentional Self Injurious Behavior: None Family Suicide History: No Recent stressful life event(s): Conflict (Comment)(Pt is currently in conflict w/ a  friend) Persecutory voices/beliefs?: No Depression: Yes Depression Symptoms: Insomnia, Loss of interest in usual pleasures, Feeling worthless/self pity, Feeling angry/irritable Substance abuse history and/or treatment for substance abuse?: Yes Suicide prevention information given to non-admitted patients: Not applicable  Risk to Others within the past 6 months Homicidal Ideation: Yes-Currently Present Does patient have any lifetime risk of violence toward others beyond the six months prior to admission? : No Thoughts of Harm to Others: Yes-Currently Present Comment - Thoughts of Harm to Others: Pt  is having thoughts of harming one of his friends Current Homicidal Intent: No Current Homicidal Plan: No Access to Homicidal Means: No Describe Access to Homicidal Means: N/A Identified Victim: One of pt's friends History of harm to others?: No Assessment of Violence: On admission Violent Behavior Description: N/A Does patient have access to weapons?: No(Pt denies access to guns/weapons) Criminal Charges Pending?: No Does patient have a court date: No Is patient on probation?: No  Psychosis Hallucinations: Auditory Delusions: None noted  Mental Status Report Appearance/Hygiene: In scrubs Eye Contact: Good Motor Activity: Unremarkable Speech: Logical/coherent Level of Consciousness: Drowsy Mood: Guilty Affect: Appropriate to circumstance Anxiety Level: Minimal Thought Processes: Coherent Judgement: Partial Orientation: Person, Place, Time, Situation Obsessive Compulsive Thoughts/Behaviors: None  Cognitive Functioning Concentration: Normal Memory: Recent Intact, Remote Intact Is patient IDD: No Insight: Fair Impulse Control: Poor Appetite: Poor Have you had any weight changes? : Loss Amount of the weight change? (lbs): 30 lbs(Has lost 30 lbs in 2 months) Sleep: Decreased Total Hours of Sleep: 3 Vegetative Symptoms: None  ADLScreening [facility] Assessment Services) Patient's cognitive ability adequate to safely complete daily activities?: Yes Patient able to express need for assistance with ADLs?: Yes Independently performs ADLs?: Yes (appropriate for developmental age)  Prior Inpatient Therapy Prior Inpatient Therapy: Yes Prior Therapy Dates: 2019 Prior Therapy Facilty/Provider(s): [facility], [place] Reason for Treatment: MH  Prior Outpatient Therapy Prior Outpatient Therapy: No Does patient have an ACCT team?: No Does patient have Intensive In-House Services?  : No Does patient have [facility] services? : No Does patient have P4CC services?:  No  ADL Screening (condition at time of admission) Patient's cognitive ability adequate to safely complete daily activities?: Yes Patient able to express need for assistance with ADLs?: Yes Independently performs ADLs?: Yes (appropriate for developmental age)       Abuse/Neglect Assessment (Assessment to be complete while patient is alone) Abuse/Neglect Assessment Can Be Completed: Yes Physical Abuse: Denies Verbal Abuse: Yes, past (Comment)(Pt states he has had past girlfriends and his mother was [place] towards him) Sexual Abuse: Denies Exploitation of patient/patient's resources: Denies Self-Neglect: Denies Values / Beliefs Cultural Requests During Hospitalization: None Spiritual Requests During Hospitalization: None Consults Spiritual Care Consult Needed: No Social Work Consult Needed: No Regulatory affairs officer (For Healthcare) Does Patient Have a Medical Advance Directive?: No Would patient like information on creating a medical advance directive?: No - Patient declined       Disposition: Lindon Romp, NP, reviewed pt's chart and information and determined pt should be observed overnight for safety and stability and be re-assessed in the morning. Pt's nurse, Beckie Busing RN, was provided this information at [phone].   Disposition Initial Assessment Completed for this Encounter: Yes Patient referred to: Other (Comment)(Pt will be observed overnight for safety and stability)  This service was provided via telemedicine using a 2-way, interactive audio and video technology.  Names of all persons participating in this telemedicine service and their role in this encounter. Name: Marquel 'Buddy' Donaghue Role:  Patient  Name: Lindon Romp Role: Nurse Practitioner  Name: Windell Hummingbird Role: Clinician    Dannielle Burn 10/19/2018 3:41 AM

## 2018-10-19 NOTE — ED Notes (Signed)
TTS in process 

## 2018-10-19 NOTE — ED Provider Notes (Signed)
[facility] EMERGENCY DEPARTMENT Provider Note   CSN: [id number] Arrival date & time: 10/18/18  2132    History   Chief Complaint Chief Complaint  Patient presents with  . Suicidal  . Homicidal    wants to kill a friend    HPI Barry Moore is a 53 y.o. male with a history of alcoholic hepatitis, alcohol dependence, seizures, pancreatitis, and GI bleed in by EMS with a chief complaint of suicidal ideation, homicidal ideation, and seizure-like activity.  The patient reports that he has been having homicidal thoughts and wants to kill 1 of his "ex-friends".  He also reports passive suicidal ideation and states he just have anything to live for anymore. "It doesn't matter to me."  He does not currently have a plan.  He reports 1 previous suicide attempt several years ago when he attempted to overdose on lithium.  He denies visual hallucinations, but states "I've been hearing birds."   He reports that he drinks alcohol daily.  He drinks 5-6 forty ounce beers daily.  Last drink was just prior to arrival. He smokes 1 to 2 packs of cigarettes daily.  Patient reports that he may have had 3 seizures earlier today.  He denies hitting his head, tongue biting, or incontinence.  He reports that he has been having a mild headache and generalized weakness.  He reports that he is also been having diarrhea and generalized abdominal discomfort.  He denies fever, chills, vomiting, melena, hematochezia, shortness of breath, cough, numbness, focal weakness, visual changes.     The history is provided by the patient. No language interpreter was used.    Past Medical History:  Diagnosis Date  . Alcoholic hepatitis   . Depression   . ETOH abuse   . Gastric bleed 08/2018  . Hypertension   . Pancreatitis   . Seizures ([place])   . Suicidal behavior     Patient Active Problem List   Diagnosis Date Noted  . Homicidal ideation   . Gastrointestinal hemorrhage with melena 08/31/2018  .  Colon ulcer   . Polyp of ascending colon   . Acute GI bleeding 08/05/2018  . Transaminitis 08/05/2018  . Abdominal pain 08/05/2018  . Nausea and vomiting 08/05/2018  . Substance induced mood disorder ([place]) 07/16/2017  . Severe recurrent major depression without psychotic features ([place]) 05/15/2017  . Anemia 03/18/2017  . Acute blood loss anemia 02/21/2017  . UGI bleed 02/08/2017  . Alcohol withdrawal ([place]) 02/08/2017  . Cocaine abuse ([place]) 11/18/2016  . GI bleed 11/01/2016  . Alcohol abuse   . Major depressive disorder, recurrent severe without psychotic features ([place]) 07/06/2016  . Alcohol-induced mood disorder ([place]) 06/15/2016  . Seizure ([place]) 05/26/2016  . Tobacco abuse 05/26/2016  . Homeless 05/26/2016  . Essential hypertension 05/26/2016  . Alcohol dependence with alcohol-induced mood disorder ([place]) 02/14/2016  . Severe alcohol withdrawal without perceptual disturbances without complication ([place]) 62/83/1517  . Alcoholic hepatitis without ascites 02/14/2016  . Thrombocytopenia ([place]) 02/14/2016    Past Surgical History:  Procedure Laterality Date  . BIOPSY  08/05/2018   Procedure: BIOPSY;  Surgeon: Irving Copas., MD;  Location: [place];  Service: Gastroenterology;;  . BIOPSY  08/07/2018   Procedure: BIOPSY;  Surgeon: Thornton Park, MD;  Location: [place];  Service: Gastroenterology;;  . COLONOSCOPY WITH PROPOFOL N/A 08/07/2018   Procedure: COLONOSCOPY WITH PROPOFOL;  Surgeon: Thornton Park, MD;  Location: [place];  Service: Gastroenterology;  Laterality: N/A;  . ESOPHAGOGASTRODUODENOSCOPY (EGD) WITH PROPOFOL  N/A 11/02/2016   Procedure: ESOPHAGOGASTRODUODENOSCOPY (EGD) WITH PROPOFOL;  Surgeon: Carol Ada, MD;  Location: [facility];  Service: Endoscopy;  Laterality: N/A;  . ESOPHAGOGASTRODUODENOSCOPY (EGD) WITH PROPOFOL N/A 08/05/2018   Procedure: ESOPHAGOGASTRODUODENOSCOPY (EGD) WITH PROPOFOL;  Surgeon: Rush Landmark Telford Nab., MD;  Location: [place];  Service: Gastroenterology;  Laterality: N/A;  . HERNIA REPAIR    . LEG SURGERY    . POLYPECTOMY  08/07/2018   Procedure: POLYPECTOMY;  Surgeon: Thornton Park, MD;  Location: [facility] ENDOSCOPY;  Service: Gastroenterology;;        Home Medications    Prior to Admission medications   Medication Sig Start Date End Date Taking? Authorizing Provider  ferrous sulfate 325 (65 FE) MG tablet Take 1 tablet (325 mg total) by mouth daily with breakfast. Patient not taking: Reported on 09/23/2018 08/09/18 10/09/18  Elodia Florence., MD  pantoprazole (PROTONIX) 40 MG tablet Take 1 tablet (40 mg total) by mouth 2 (two) times daily. Patient not taking: Reported on 09/23/2018 08/09/18 10/09/18  Elodia Florence., MD    Family History Family History  Problem Relation Age of Onset  . Diabetes Mother   . Alcoholism Mother   . Emphysema Father   . Lung cancer Father   . Alcoholism Father     Social History Social History   Tobacco Use  . Smoking status: Current Every Day Smoker    Packs/day: 1.00    Types: Cigarettes  . Smokeless tobacco: Never Used  Substance Use Topics  . Alcohol use: Yes    Comment: 5+ BEERS DAILY  . Drug use: Not Currently    Frequency: 3.0 times per week    Types: Cocaine     Allergies   Tomato and Aspirin   Review of Systems Review of Systems  Constitutional: Negative for appetite change, chills and fever.  HENT: Negative for congestion, sinus pressure, sinus pain and sore throat.   Respiratory: Negative for shortness of breath.   Cardiovascular: Negative for chest pain, palpitations and leg swelling.  Gastrointestinal: Positive for abdominal pain and diarrhea. Negative for nausea and vomiting.  Genitourinary: Negative for dysuria, frequency, genital sores, hematuria, penile pain and urgency.  Musculoskeletal: Negative for back pain, myalgias, neck pain and neck stiffness.  Skin: Negative for rash.  Allergic/Immunologic: Negative for  immunocompromised state.  Neurological: Positive for seizures and headaches. Negative for dizziness, syncope, weakness (generalized), light-headedness and numbness.  Psychiatric/Behavioral: Negative for confusion.     Physical Exam Updated Vital Signs BP 140/84 (BP Location: Right Arm)   Pulse 84   Temp 98.2 F (36.8 C) (Oral)   Resp 18   Ht 5\' 8"  (1.727 m)   Wt 69.9 kg   SpO2 95%   BMI 23.42 kg/m   Physical Exam Vitals signs and nursing note reviewed.  Constitutional:      Appearance: He is well-developed.     Comments: Appears much older than stated age.  Disheveled.  HENT:     Head: Normocephalic. Contusion present.     Comments: Head is atraumatic.  Eyes:     General: No scleral icterus.    Extraocular Movements: Extraocular movements intact.     Conjunctiva/sclera: Conjunctivae normal.     Pupils: Pupils are equal, round, and reactive to light.  Neck:     Musculoskeletal: Neck supple.     Comments: Full active and passive range of motion of the neck. Cardiovascular:     Rate and Rhythm: Normal rate and regular rhythm.  Heart sounds: Normal heart sounds. No murmur. No friction rub. No gallop.   Pulmonary:     Effort: Pulmonary effort is normal. No respiratory distress.     Breath sounds: Normal breath sounds. No stridor. No wheezing, rhonchi or rales.  Chest:     Chest wall: No tenderness.  Abdominal:     General: There is no distension.     Palpations: Abdomen is soft. There is no mass.     Tenderness: There is abdominal tenderness. There is no right CVA tenderness, left CVA tenderness, guarding or rebound.     Hernia: No hernia is present.     Comments: Mild generalized tenderness to palpation throughout the abdomen without rebound or guarding.  Musculoskeletal:     Right lower leg: No edema.     Left lower leg: No edema.  Skin:    General: Skin is warm and dry.     Coloration: Skin is not jaundiced.  Neurological:     General: No focal deficit  present.     Mental Status: He is alert.     Comments: GCS 15. Speaks in complete, fluent sentences without slurred speech.  No ataxia.  Moves all 4 extremities.  Cranial nerves II through XII are grossly intact.  Sensation and motor strength are intact and equal to the bilateral upper and lower extremities.  Psychiatric:        Behavior: Behavior normal.      ED Treatments / Results  Labs (all labs ordered are listed, but only abnormal results are displayed) Labs Reviewed  COMPREHENSIVE METABOLIC PANEL - Abnormal; Notable for the following components:      Result Value   Potassium 3.3 (*)    Glucose, Bld 102 (*)    BUN <5 (*)    Creatinine, Ser 0.58 (*)    Calcium 8.8 (*)    AST 126 (*)    Alkaline Phosphatase 175 (*)    All other components within normal limits  ETHANOL - Abnormal; Notable for the following components:   Alcohol, Ethyl (B) 434 (*)    All other components within normal limits  ACETAMINOPHEN LEVEL - Abnormal; Notable for the following components:   Acetaminophen (Tylenol), Serum <10 (*)    All other components within normal limits  CBC - Abnormal; Notable for the following components:   RBC 3.92 (*)    Hemoglobin 12.0 (*)    HCT 36.0 (*)    RDW 18.3 (*)    Platelets 39 (*)    All other components within normal limits  SALICYLATE LEVEL  RAPID URINE DRUG SCREEN, HOSP PERFORMED    EKG None  Radiology No results found.  Procedures Procedures (including critical care time)  Medications Ordered in ED Medications  LORazepam (ATIVAN) injection 0-4 mg (0 mg Intravenous Not Given 10/19/18 0320)    Or  LORazepam (ATIVAN) tablet 0-4 mg ( Oral See Alternative 10/19/18 0320)  LORazepam (ATIVAN) injection 0-4 mg (has no administration in time range)    Or  LORazepam (ATIVAN) tablet 0-4 mg (has no administration in time range)  thiamine (VITAMIN B-1) tablet 100 mg (has no administration in time range)    Or  thiamine (B-1) injection 100 mg (has no  administration in time range)  alum & mag hydroxide-simeth (MAALOX/MYLANTA) 200-200-20 MG/5ML suspension 30 mL (has no administration in time range)  nicotine (NICODERM CQ - dosed in mg/24 hours) patch 21 mg (has no administration in time range)     Initial Impression / Assessment  and Plan / ED Course  I have reviewed the triage vital signs and the nursing notes.  Pertinent labs & imaging results that were available during my care of the patient were reviewed by me and considered in my medical decision making (see chart for details).        53 year old male with a history of homelessness, alcoholic hepatitis, alcohol dependence, seizures, pancreatitis, and GI bleed who is well-known to this emergency department with 14 visits in the last 6 months.    Patient endorses seizure-like activity x3 prior to arrival in the ER.  He has been observed for several hours with no seizure-like activity.  He is also endorsing a headache, abdominal pain, and diarrhea.   Labs are notable for ethanol level of 434.  Hemoglobin is 12 and is stable from previous.  He is also not having any hematemesis, melena, hematochezia.  Doubt GI bleed.  Alkaline phosphatase is 175 and AST is elevated at 126.  This appears to be around the patient's baseline and is likely secondary to chronic alcohol use.  He also has a thrombocytopenia liked related to alcoholism.  UDS is unremarkable.  EKG with normal sinus rhythm.  Since head is atraumatic and patient has no witnessed seizures, I do not feel that head CT is indicated at this time, especially with normal neurologic exam.  CIWA protocol ordered.  Patient is voluntary.  TTS consulted who recommends AM Psych evaluation. Psych hold orders and home med orders placed. TTS consult pending; please see psych team notes for further documentation of care/dispo. Pt stable at time of med clearance.     Final Clinical Impressions(s) / ED Diagnoses   Final diagnoses:  None    ED  Discharge Orders    None       Breeanne Oblinger, Laymond Purser, PA-C 10/19/18 0728    Ward, Delice Bison, DO 10/19/18 [id number]

## 2018-10-19 NOTE — ED Notes (Signed)
Regular Diet was ordered for Lunch. 

## 2018-11-03 ENCOUNTER — Encounter (HOSPITAL_COMMUNITY): Payer: Self-pay

## 2018-11-03 ENCOUNTER — Emergency Department (HOSPITAL_COMMUNITY): Payer: Self-pay

## 2018-11-03 ENCOUNTER — Emergency Department (HOSPITAL_COMMUNITY)
Admission: EM | Admit: 2018-11-03 | Discharge: 2018-11-04 | Disposition: A | Discharge status: Home / Self Care | Payer: Self-pay | Attending: Emergency Medicine | Admitting: Emergency Medicine

## 2018-11-03 ENCOUNTER — Other Ambulatory Visit: Payer: Self-pay

## 2018-11-03 DIAGNOSIS — I10 Essential (primary) hypertension: Secondary | ICD-10-CM | POA: Insufficient documentation

## 2018-11-03 DIAGNOSIS — F17210 Nicotine dependence, cigarettes, uncomplicated: Secondary | ICD-10-CM | POA: Insufficient documentation

## 2018-11-03 DIAGNOSIS — F10920 Alcohol use, unspecified with intoxication, uncomplicated: Secondary | ICD-10-CM | POA: Insufficient documentation

## 2018-11-03 DIAGNOSIS — R569 Unspecified convulsions: Secondary | ICD-10-CM | POA: Insufficient documentation

## 2018-11-03 DIAGNOSIS — R1011 Right upper quadrant pain: Secondary | ICD-10-CM | POA: Insufficient documentation

## 2018-11-03 DIAGNOSIS — R112 Nausea with vomiting, unspecified: Secondary | ICD-10-CM | POA: Insufficient documentation

## 2018-11-03 DIAGNOSIS — K811 Chronic cholecystitis: Secondary | ICD-10-CM | POA: Insufficient documentation

## 2018-11-03 LAB — COMPREHENSIVE METABOLIC PANEL
ALT: 38 U/L (ref 0–44)
AST: 155 U/L — ABNORMAL HIGH (ref 15–41)
Albumin: 3.7 g/dL (ref 3.5–5.0)
Alkaline Phosphatase: 181 U/L — ABNORMAL HIGH (ref 38–126)
Anion gap: 11 (ref 5–15)
BUN: <5 mg/dL — ABNORMAL LOW (ref 6–20)
CO2: 24 mmol/L (ref 22–32)
Calcium: 8.3 mg/dL — ABNORMAL LOW (ref 8.9–10.3)
Chloride: 103 mmol/L (ref 98–111)
Creatinine, Ser: 0.61 mg/dL (ref 0.61–1.24)
GFR calc Af Amer: >60 mL/min (ref >60)
GFR calc non Af Amer: >60 mL/min (ref >60)
Glucose, Bld: 86 mg/dL (ref 70–99)
Potassium: 3.4 mmol/L — ABNORMAL LOW (ref 3.5–5.1)
Sodium: 138 mmol/L (ref 135–145)
Total Bilirubin: 1.4 mg/dL — ABNORMAL HIGH (ref 0.3–1.2)
Total Protein: 8.1 g/dL (ref 6.5–8.1)

## 2018-11-03 LAB — CBC
HCT: 36 % — ABNORMAL LOW (ref 39.0–52.0)
Hemoglobin: 11.7 g/dL — ABNORMAL LOW (ref 13.0–17.0)
MCH: 30.6 pg (ref 26.0–34.0)
MCHC: 32.5 g/dL (ref 30.0–36.0)
MCV: 94.2 fL (ref 80.0–100.0)
Platelets: 89 10*3/uL — ABNORMAL LOW (ref 150–400)
RBC: 3.82 MIL/uL — ABNORMAL LOW (ref 4.22–5.81)
RDW: 17.2 % — ABNORMAL HIGH (ref 11.5–15.5)
WBC: 4 10*3/uL (ref 4.0–10.5)
nRBC: 0 % (ref 0.0–0.2)

## 2018-11-03 LAB — LIPASE, BLOOD: Lipase: 56 U/L — ABNORMAL HIGH (ref 11–51)

## 2018-11-03 MED ORDER — PANTOPRAZOLE SODIUM 40 MG IV SOLR
40.0000 mg | Freq: Once | INTRAVENOUS | Status: AC
  Administered 2018-11-03: 22:00:00 40 mg via INTRAVENOUS
  Filled 2018-11-03: qty 40

## 2018-11-03 MED ORDER — HYDROMORPHONE HCL 1 MG/ML IJ SOLN: 0.5000 mg | Freq: Once | INTRAMUSCULAR | Status: DC

## 2018-11-03 MED ORDER — SODIUM CHLORIDE 0.9 % IV BOLUS
1000.0000 mL | Freq: Once | INTRAVENOUS | Status: AC
  Administered 2018-11-03: 22:00:00 1000 mL via INTRAVENOUS

## 2018-11-03 MED ORDER — ONDANSETRON HCL 4 MG/2ML IJ SOLN
4.0000 mg | Freq: Once | INTRAMUSCULAR | Status: AC
  Administered 2018-11-03: 4 mg via INTRAVENOUS
  Filled 2018-11-03: qty 2

## 2018-11-03 MED ORDER — IOHEXOL 300 MG/ML  SOLN
100.0000 mL | Freq: Once | INTRAMUSCULAR | Status: AC | PRN
  Administered 2018-11-03: 100 mL via INTRAVENOUS

## 2018-11-03 MED ORDER — SODIUM CHLORIDE 0.9% FLUSH
3.0000 mL | Freq: Once | INTRAVENOUS | Status: AC
  Administered 2018-11-03: 22:00:00 3 mL via INTRAVENOUS

## 2018-11-03 NOTE — ED Provider Notes (Signed)
[facility] DEPT Provider Note   CSN: [id number] Arrival date & time: 11/03/18  1806     History   Chief Complaint Chief Complaint  Patient presents with  . Abdominal Pain  . Seizures    HPI Barry Moore is a 53 y.o. male.     Patient complains of right lower quadrant abdominal pain.  Patient has been drinking alcohol.  Patient has a history of alcohol abuse   The history is provided by the patient.  Abdominal Pain Pain location:  Generalized Pain quality: aching   Pain radiates to:  Does not radiate Pain severity:  Moderate Onset quality:  Gradual Timing:  Constant Progression:  Worsening Chronicity:  Recurrent Context: alcohol use   Relieved by:  Nothing Worsened by:  Nothing Associated symptoms: no chest pain, no cough, no diarrhea, no fatigue and no hematuria   Seizures   Past Medical History:  Diagnosis Date  . Alcoholic hepatitis   . Depression   . ETOH abuse   . Gastric bleed 08/2018  . Hypertension   . Pancreatitis   . Seizures ([place])   . Suicidal behavior     Patient Active Problem List   Diagnosis Date Noted  . Homicidal ideation   . Gastrointestinal hemorrhage with melena 08/31/2018  . Colon ulcer   . Polyp of ascending colon   . Acute GI bleeding 08/05/2018  . Transaminitis 08/05/2018  . Abdominal pain 08/05/2018  . Nausea and vomiting 08/05/2018  . Substance induced mood disorder ([place]) 07/16/2017  . Severe recurrent major depression without psychotic features ([place]) 05/15/2017  . Anemia 03/18/2017  . Acute blood loss anemia 02/21/2017  . UGI bleed 02/08/2017  . Alcohol withdrawal ([place]) 02/08/2017  . Cocaine abuse ([place]) 11/18/2016  . GI bleed 11/01/2016  . Alcohol abuse   . 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 Seizure ([place]) 05/26/2016  . Tobacco abuse 05/26/2016  . Homeless 05/26/2016  . Essential  hypertension 05/26/2016  . Alcohol dependence with alcohol-induced mood disorder ([place]) 02/14/2016  . Severe alcohol withdrawal without perceptual disturbances without complication ([place]) 40/34/7425  . Alcoholic hepatitis without ascites 02/14/2016  . Thrombocytopenia ([place]) 02/14/2016    Past Surgical History:  Procedure Laterality Date  . BIOPSY  08/05/2018   Procedure: BIOPSY;  Surgeon: Irving Copas., MD;  Location: [facility];  Service: Gastroenterology;;  . BIOPSY  08/07/2018   Procedure: BIOPSY;  Surgeon: Thornton Park, MD;  Location: [facility];  Service: Gastroenterology;;  . COLONOSCOPY WITH PROPOFOL N/A 08/07/2018   Procedure: COLONOSCOPY WITH PROPOFOL;  Surgeon: Thornton Park, MD;  Location: [facility];  Service: Gastroenterology;  Laterality: N/A;  . ESOPHAGOGASTRODUODENOSCOPY (EGD) WITH PROPOFOL N/A 11/02/2016   Procedure: ESOPHAGOGASTRODUODENOSCOPY (EGD) WITH PROPOFOL;  Surgeon: Carol Ada, MD;  Location: [facility];  Service: Endoscopy;  Laterality: N/A;  . ESOPHAGOGASTRODUODENOSCOPY (EGD) WITH PROPOFOL N/A 08/05/2018   Procedure: ESOPHAGOGASTRODUODENOSCOPY (EGD) WITH PROPOFOL;  Surgeon: Rush Landmark Telford Nab., MD;  Location: [facility];  Service: Gastroenterology;  Laterality: N/A;  . HERNIA REPAIR    . LEG SURGERY    . POLYPECTOMY  08/07/2018   Procedure: POLYPECTOMY;  Surgeon: Thornton Park, MD;  Location: [facility] ENDOSCOPY;  Service: Gastroenterology;;        Home Medications    Prior to Admission medications   Medication Sig Start Date End Date Taking? Authorizing Provider  ferrous sulfate 325 (65 FE) MG tablet Take 1 tablet (325 mg total)  by mouth daily with breakfast. Patient not taking: Reported on 09/23/2018 08/09/18 10/09/18  Elodia Florence., MD  pantoprazole (PROTONIX) 40 MG tablet Take 1 tablet (40 mg total) by mouth 2 (two) times daily. Patient not taking: Reported on 09/23/2018 08/09/18 10/09/18  Elodia Florence., MD     Family History Family History  Problem Relation Age of Onset  . Diabetes Mother   . Alcoholism Mother   . Emphysema Father   . Lung cancer Father   . Alcoholism Father     Social History Social History   Tobacco Use  . Smoking status: Current Every Day Smoker    Packs/day: 1.00    Types: Cigarettes  . Smokeless tobacco: Never Used  Substance Use Topics  . Alcohol use: Yes    Comment: 5+ BEERS DAILY  . Drug use: Not Currently    Frequency: 3.0 times per week    Types: Cocaine     Allergies   Tomato and Aspirin   Review of Systems Review of Systems  Constitutional: Negative for appetite change and fatigue.  HENT: Negative for congestion, ear discharge and sinus pressure.   Eyes: Negative for discharge.  Respiratory: Negative for cough.   Cardiovascular: Negative for chest pain.  Gastrointestinal: Positive for abdominal pain. Negative for diarrhea.  Genitourinary: Negative for frequency and hematuria.  Musculoskeletal: Negative for back pain.  Skin: Negative for rash.  Neurological: Positive for seizures. Negative for headaches.  Psychiatric/Behavioral: Negative for hallucinations.     Physical Exam Updated Vital Signs Ht 6' (1.829 m)   Wt 69.9 kg   BMI 20.90 kg/m   Physical Exam Vitals signs and nursing note reviewed.  Constitutional:      Appearance: He is well-developed.  HENT:     Head: Normocephalic.     Nose: Nose normal.  Eyes:     General: No scleral icterus.    Conjunctiva/sclera: Conjunctivae normal.  Neck:     Musculoskeletal: Neck supple.     Thyroid: No thyromegaly.  Cardiovascular:     Rate and Rhythm: Normal rate and regular rhythm.     Heart sounds: No murmur. No friction rub. No gallop.   Pulmonary:     Breath sounds: No stridor. No wheezing or rales.  Chest:     Chest wall: No tenderness.  Abdominal:     General: There is no distension.     Tenderness: There is abdominal tenderness. There is no rebound.  Musculoskeletal:  Normal range of motion.  Lymphadenopathy:     Cervical: No cervical adenopathy.  Skin:    Findings: No erythema or rash.  Neurological:     Mental Status: He is oriented to person, place, and time.     Motor: No abnormal muscle tone.     Coordination: Coordination normal.  Psychiatric:        Behavior: Behavior normal.      ED Treatments / Results  Labs (all labs ordered are listed, but only abnormal results are displayed) Labs Reviewed  LIPASE, BLOOD  COMPREHENSIVE METABOLIC PANEL  CBC  URINALYSIS, ROUTINE W REFLEX MICROSCOPIC    EKG None  Radiology No results found.  Procedures Procedures (including critical care time)  Medications Ordered in ED Medications  HYDROmorphone (DILAUDID) injection 0.5 mg (0 mg Intravenous Hold 11/03/18 2115)  sodium chloride flush (NS) 0.9 % injection 3 mL (3 mLs Intravenous Given 11/03/18 2134)  ondansetron (ZOFRAN) injection 4 mg (4 mg Intravenous Given 11/03/18 2133)  sodium chloride 0.9 %  bolus 1,000 mL (1,000 mLs Intravenous New Bag/Given 11/03/18 2133)  pantoprazole (PROTONIX) injection 40 mg (40 mg Intravenous Given 11/03/18 2133)     Initial Impression / Assessment and Plan / ED Course  I have reviewed the triage vital signs and the nursing notes.  Pertinent labs & imaging results that were available during my care of the patient were reviewed by me and considered in my medical decision making (see chart for details).   Patient with abdominal pain and alcohol abuse.  Labs pending and CT scan       Final Clinical Impressions(s) / ED Diagnoses   Final diagnoses:  None    ED Discharge Orders    None       Milton Ferguson, MD 11/03/18 2142

## 2018-11-03 NOTE — ED Triage Notes (Signed)
Abdominal pain and nausea and vomiting today, possible seizure per pt.

## 2018-11-03 NOTE — ED Provider Notes (Signed)
53 year old male received a signout from Dr. Roderic Palau pending CT abdomen pelvis.  Per his HPI:  "Patient complains of right lower quadrant abdominal pain.  Patient has been drinking alcohol.  Patient has a history of alcohol abuse   The history is provided by the patient.  Abdominal Pain Pain location:  Generalized Pain quality: aching   Pain radiates to:  Does not radiate Pain severity:  Moderate Onset quality:  Gradual Timing:  Constant Progression:  Worsening Chronicity:  Recurrent Context: alcohol use   Relieved by:  Nothing Worsened by:  Nothing Associated symptoms: no chest pain, no cough, no diarrhea, no fatigue and no hematuria   Seizures"  My evaluation, patient endorses 2 episodes of bilious vomiting over the last 24 hours of multiple episodes of retching.  He reports that his last alcoholic drink was prior to arrival and when asked how much he drank he states "probably too much"  Physical Exam  Ht 6' (1.829 m)   Wt 69.9 kg   BMI 20.90 kg/m   Physical Exam Vitals signs and nursing note reviewed.  Constitutional:      Appearance: He is well-developed.  HENT:     Head: Normocephalic.  Eyes:     Conjunctiva/sclera: Conjunctivae normal.  Neck:     Musculoskeletal: Neck supple.  Cardiovascular:     Rate and Rhythm: Normal rate and regular rhythm.     Heart sounds: No murmur.  Pulmonary:     Effort: Pulmonary effort is normal.  Abdominal:     General: There is no distension.     Palpations: Abdomen is soft. There is no mass.     Tenderness: There is abdominal tenderness. There is no right CVA tenderness, left CVA tenderness, guarding or rebound.     Hernia: No hernia is present.     Comments: Tender palpation in the epigastric and right upper quadrant.  Positive Murphy sign.  No rebound or guarding.  Abdomen is soft, nondistended.  Skin:    General: Skin is warm and dry.  Neurological:     Mental Status: He is alert.     Comments: Speaks in complete, fluent  sentences without slurred speech.  Psychiatric:        Behavior: Behavior normal.     ED Course/Procedures     Procedures  MDM   53 year old male received a signout from Dr. Roderic Palau pending CT abdomen pelvis and labs.  He initially presented with right lower quadrant pain, but on my exam, his pain was localized to the epigastric and right upper quadrant.  He also endorsed 2 episodes of bilious vomiting over the last 24 hours.  He has been afebrile with normal vital signs since arrival in the ER.  No seizure-like activity.  Please see Dr. Ellsworth Lennox note for further work-up and medical decision making.  Ethanol level is 221.  He has no leukocytosis and platelets are 89, which are chronic and likely secondary to chronic alcohol use.  Lipase is minimally elevated at 56 with alkaline phosphatase at 181 and AST is 155.  ALT is normal.  CT abdomen pelvis with gallbladder wall hyperenhancement with small amount of pericholecystic fluid.  No definite gallbladder calculi are noted.  Radiology notes this was similar in appearance to CT in March 2020, but could be secondary to acute cholecystitis.  There is also inflammatory changes seen around the mesentery or pancreatic head which could be due to acute pancreatitis.  For further assessment, right upper quadrant ultrasound was ordered, which demonstrated  a sludge ball or possibly a stone in the gallbladder with diffusely thickened edematous gallbladder, similar to prior ultrasound.  Thought to be chronic gallbladder disease or secondary to underlying liver disease.  On reevaluation, the patient has been tolerating p.o. fluids without difficulty.  He has been ambulatory without assistance and appears clinically sober.  I suspect findings are chronic gallbladder disease as opposed to acute.  We will discharge the patient with Zofran and he is already established with gastroenterology.  Return precautions to the ER given if he develops new or worsening symptoms.   He is hemodynamically stable and in no acute distress.  Safe for discharge at this time.     Joline Maxcy A, PA-C 11/04/18 4446    Ripley Fraise, MD 11/05/18 [phone]

## 2018-11-03 NOTE — ED Notes (Signed)
Called Manuela Schwartz RN charge nurse to let her know I put in a order for sitter due to pts intoxication and trying to get out of bed and unsteady gait.

## 2018-11-04 ENCOUNTER — Emergency Department (HOSPITAL_COMMUNITY): Payer: Self-pay

## 2018-11-04 ENCOUNTER — Other Ambulatory Visit (HOSPITAL_COMMUNITY): Payer: Self-pay

## 2018-11-04 LAB — ETHANOL: Alcohol, Ethyl (B): 221 mg/dL — ABNORMAL HIGH (ref <10)

## 2018-11-04 MED ORDER — ONDANSETRON HCL 4 MG PO TABS: 4.0000 mg | ORAL_TABLET | Freq: Four times a day (QID) | ORAL | 0 refills | Status: DC

## 2018-11-04 NOTE — Discharge Instructions (Addendum)
Thank you for allowing me to care for you today in the Emergency Department.   You can take 1 tablet of Zofran every 6 hours as needed for nausea or vomiting.  Return to the emergency department if you develop persistent vomiting, severe abdominal pain with high fevers, if you have recurrent seizures, or other new, concerning symptoms.

## 2018-11-04 NOTE — ED Notes (Signed)
Pt continues to rest comfortably.  

## 2018-11-04 NOTE — ED Notes (Signed)
Pt tolerated water PO without vomiting.

## 2018-11-04 NOTE — ED Notes (Signed)
Pt ambulated to the restroom without assistance. He did not obtain a urine sample as requested.

## 2018-11-06 ENCOUNTER — Emergency Department (HOSPITAL_COMMUNITY)
Admission: EM | Admit: 2018-11-06 | Discharge: 2018-11-06 | Disposition: A | Discharge status: Home / Self Care | Payer: Self-pay | Attending: Emergency Medicine | Admitting: Emergency Medicine

## 2018-11-06 DIAGNOSIS — Z5321 Procedure and treatment not carried out due to patient leaving prior to being seen by health care provider: Secondary | ICD-10-CM | POA: Insufficient documentation

## 2018-11-06 DIAGNOSIS — F10120 Alcohol abuse with intoxication, uncomplicated: Secondary | ICD-10-CM | POA: Insufficient documentation

## 2018-11-06 LAB — CBC
HCT: 36.4 % — ABNORMAL LOW (ref 39.0–52.0)
Hemoglobin: 12 g/dL — ABNORMAL LOW (ref 13.0–17.0)
MCH: 31.3 pg (ref 26.0–34.0)
MCHC: 33 g/dL (ref 30.0–36.0)
MCV: 95 fL (ref 80.0–100.0)
Platelets: 74 10*3/uL — ABNORMAL LOW (ref 150–400)
RBC: 3.83 MIL/uL — ABNORMAL LOW (ref 4.22–5.81)
RDW: 17.2 % — ABNORMAL HIGH (ref 11.5–15.5)
WBC: 4.5 10*3/uL (ref 4.0–10.5)
nRBC: 0 % (ref 0.0–0.2)

## 2018-11-06 LAB — COMPREHENSIVE METABOLIC PANEL
ALT: 44 U/L (ref 0–44)
AST: 173 U/L — ABNORMAL HIGH (ref 15–41)
Albumin: 3.6 g/dL (ref 3.5–5.0)
Alkaline Phosphatase: 220 U/L — ABNORMAL HIGH (ref 38–126)
Anion gap: 11 (ref 5–15)
BUN: <5 mg/dL — ABNORMAL LOW (ref 6–20)
CO2: 23 mmol/L (ref 22–32)
Calcium: 9 mg/dL (ref 8.9–10.3)
Chloride: 103 mmol/L (ref 98–111)
Creatinine, Ser: 0.81 mg/dL (ref 0.61–1.24)
GFR calc Af Amer: >60 mL/min (ref >60)
GFR calc non Af Amer: >60 mL/min (ref >60)
Glucose, Bld: 111 mg/dL — ABNORMAL HIGH (ref 70–99)
Potassium: 3.4 mmol/L — ABNORMAL LOW (ref 3.5–5.1)
Sodium: 137 mmol/L (ref 135–145)
Total Bilirubin: 1.2 mg/dL (ref 0.3–1.2)
Total Protein: 8 g/dL (ref 6.5–8.1)

## 2018-11-06 LAB — ETHANOL: Alcohol, Ethyl (B): 437 mg/dL (ref <10)
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
## 2018-11-16 ENCOUNTER — Encounter (HOSPITAL_COMMUNITY): Payer: Self-pay

## 2018-11-16 ENCOUNTER — Emergency Department (HOSPITAL_COMMUNITY)
Admission: EM | Admit: 2018-11-16 | Discharge: 2018-11-17 | Disposition: A | Discharge status: Home / Self Care | Payer: Self-pay | Attending: Emergency Medicine | Admitting: Emergency Medicine

## 2018-11-16 ENCOUNTER — Other Ambulatory Visit: Payer: Self-pay

## 2018-11-16 DIAGNOSIS — F1010 Alcohol abuse, uncomplicated: Secondary | ICD-10-CM | POA: Insufficient documentation

## 2018-11-16 DIAGNOSIS — R45851 Suicidal ideations: Secondary | ICD-10-CM | POA: Insufficient documentation

## 2018-11-16 DIAGNOSIS — I10 Essential (primary) hypertension: Secondary | ICD-10-CM | POA: Insufficient documentation

## 2018-11-16 DIAGNOSIS — F17210 Nicotine dependence, cigarettes, uncomplicated: Secondary | ICD-10-CM | POA: Insufficient documentation

## 2018-11-16 DIAGNOSIS — Y908 Blood alcohol level of 240 mg/100 ml or more: Secondary | ICD-10-CM | POA: Insufficient documentation

## 2018-11-16 NOTE — ED Triage Notes (Signed)
Pt reports SI and using large amounts of ETOH today. He also reports lower back pain and dark urine. Pt also reports a cough that sometimes causes him to gag.

## 2018-11-17 LAB — CBC
HCT: 33.4 % — ABNORMAL LOW (ref 39.0–52.0)
Hemoglobin: 10.9 g/dL — ABNORMAL LOW (ref 13.0–17.0)
MCH: 31.4 pg (ref 26.0–34.0)
MCHC: 32.6 g/dL (ref 30.0–36.0)
MCV: 96.3 fL (ref 80.0–100.0)
Platelets: 51 10*3/uL — ABNORMAL LOW (ref 150–400)
RBC: 3.47 MIL/uL — ABNORMAL LOW (ref 4.22–5.81)
RDW: 16.3 % — ABNORMAL HIGH (ref 11.5–15.5)
WBC: 3.8 10*3/uL — ABNORMAL LOW (ref 4.0–10.5)
nRBC: 0 % (ref 0.0–0.2)

## 2018-11-17 LAB — COMPREHENSIVE METABOLIC PANEL
ALT: 40 U/L (ref 0–44)
AST: 204 U/L — ABNORMAL HIGH (ref 15–41)
Albumin: 3.6 g/dL (ref 3.5–5.0)
Alkaline Phosphatase: 179 U/L — ABNORMAL HIGH (ref 38–126)
Anion gap: 14 (ref 5–15)
BUN: <5 mg/dL — ABNORMAL LOW (ref 6–20)
CO2: 21 mmol/L — ABNORMAL LOW (ref 22–32)
Calcium: 8.4 mg/dL — ABNORMAL LOW (ref 8.9–10.3)
Chloride: 105 mmol/L (ref 98–111)
Creatinine, Ser: 0.68 mg/dL (ref 0.61–1.24)
GFR calc Af Amer: >60 mL/min (ref >60)
GFR calc non Af Amer: >60 mL/min (ref >60)
Glucose, Bld: 106 mg/dL — ABNORMAL HIGH (ref 70–99)
Potassium: 3.3 mmol/L — ABNORMAL LOW (ref 3.5–5.1)
Sodium: 140 mmol/L (ref 135–145)
Total Bilirubin: 1.5 mg/dL — ABNORMAL HIGH (ref 0.3–1.2)
Total Protein: 7.9 g/dL (ref 6.5–8.1)

## 2018-11-17 LAB — ETHANOL: Alcohol, Ethyl (B): 373 mg/dL (ref <10)

## 2018-11-17 LAB — SALICYLATE LEVEL: Salicylate Lvl: <7 mg/dL (ref 2.8–30.0)

## 2018-11-17 LAB — ACETAMINOPHEN LEVEL: Acetaminophen (Tylenol), Serum: <10 ug/mL — ABNORMAL LOW (ref 10–30)

## 2018-11-17 NOTE — ED Notes (Signed)
2 bags of patient belongings in cabinets.

## 2018-11-17 NOTE — ED Notes (Signed)
Alcohol 373

## 2018-11-17 NOTE — ED Notes (Signed)
Pt declined discharge vitals and was given his discharge papers and his belongings.

## 2018-11-17 NOTE — ED Provider Notes (Signed)
[facility] DEPT Provider Note   CSN: [id number] Arrival date & time: 11/16/18  2141     History   Chief Complaint Chief Complaint  Patient presents with  . Alcohol Intoxication  . Suicidal    HPI Barry Moore is a 53 y.o. male.      Alcohol Intoxication This is a chronic problem. The current episode started more than 1 week ago. The problem occurs constantly. The problem has not changed since onset.Pertinent negatives include no chest pain. Nothing aggravates the symptoms. Nothing relieves the symptoms.  Mental Health Problem Presenting symptoms: suicidal thoughts   Degree of incapacity (severity):  Moderate Onset quality:  Unable to specify Progression:  Worsening Context: alcohol use   Associated symptoms: no chest pain     Past Medical History:  Diagnosis Date  . Alcoholic hepatitis   . Depression   . ETOH abuse   . Gastric bleed 08/2018  . Hypertension   . Pancreatitis   . Seizures ([place])   . Suicidal behavior     Patient Active Problem List   Diagnosis Date Noted  . Homicidal ideation   . Gastrointestinal hemorrhage with melena 08/31/2018  . Colon ulcer   . Polyp of ascending colon   . Acute GI bleeding 08/05/2018  . Transaminitis 08/05/2018  . Abdominal pain 08/05/2018  . Nausea and vomiting 08/05/2018  . Substance induced mood disorder ([place]) 07/16/2017  . Severe recurrent major depression without psychotic features ([place]) 05/15/2017  . Anemia 03/18/2017  . Acute blood loss anemia 02/21/2017  . UGI bleed 02/08/2017  . Alcohol withdrawal ([place]) 02/08/2017  . Cocaine abuse ([place]) 11/18/2016  . GI bleed 11/01/2016  . Alcohol abuse   . 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 Seizure ([place]) 05/26/2016  . Tobacco abuse 05/26/2016  . Homeless 05/26/2016  . Essential hypertension 05/26/2016  . Alcohol dependence with  alcohol-induced mood disorder ([place]) 02/14/2016  . Severe alcohol withdrawal without perceptual disturbances without complication ([place]) [id number]  . Alcoholic hepatitis without ascites 02/14/2016  . Thrombocytopenia ([place]) 02/14/2016    Past Surgical History:  Procedure Laterality Date  . BIOPSY  08/05/2018   Procedure: BIOPSY;  Surgeon: Irving Copas., MD;  Location: [facility];  Service: Gastroenterology;;  . BIOPSY  08/07/2018   Procedure: BIOPSY;  Surgeon: Thornton Park, MD;  Location: [facility];  Service: Gastroenterology;;  . COLONOSCOPY WITH PROPOFOL N/A 08/07/2018   Procedure: COLONOSCOPY WITH PROPOFOL;  Surgeon: Thornton Park, MD;  Location: [facility];  Service: Gastroenterology;  Laterality: N/A;  . ESOPHAGOGASTRODUODENOSCOPY (EGD) WITH PROPOFOL N/A 11/02/2016   Procedure: ESOPHAGOGASTRODUODENOSCOPY (EGD) WITH PROPOFOL;  Surgeon: Carol Ada, MD;  Location: [facility];  Service: Endoscopy;  Laterality: N/A;  . ESOPHAGOGASTRODUODENOSCOPY (EGD) WITH PROPOFOL N/A 08/05/2018   Procedure: ESOPHAGOGASTRODUODENOSCOPY (EGD) WITH PROPOFOL;  Surgeon: Rush Landmark Telford Nab., MD;  Location: [facility];  Service: Gastroenterology;  Laterality: N/A;  . HERNIA REPAIR    . LEG SURGERY    . POLYPECTOMY  08/07/2018   Procedure: POLYPECTOMY;  Surgeon: Thornton Park, MD;  Location: [facility] ENDOSCOPY;  Service: Gastroenterology;;        Home Medications    Prior to Admission medications   Medication Sig Start Date End Date Taking? Authorizing Provider  ondansetron (ZOFRAN) 4 MG tablet Take 1 tablet (4 mg total) by mouth every 6 (six) hours. 11/04/18   McDonald, Mia A, PA-C  ferrous sulfate 325 (65 FE) MG tablet  Take 1 tablet (325 mg total) by mouth daily with breakfast. Patient not taking: Reported on 09/23/2018 08/09/18 10/09/18  Elodia Florence., MD  pantoprazole (PROTONIX) 40 MG tablet Take 1 tablet (40 mg total) by mouth 2 (two) times daily. Patient not taking:  Reported on 09/23/2018 08/09/18 10/09/18  Elodia Florence., MD    Family History Family History  Problem Relation Age of Onset  . Diabetes Mother   . Alcoholism Mother   . Emphysema Father   . Lung cancer Father   . Alcoholism Father     Social History Social History   Tobacco Use  . Smoking status: Current Every Day Smoker    Packs/day: 1.00    Types: Cigarettes  . Smokeless tobacco: Never Used  Substance Use Topics  . Alcohol use: Yes    Comment: 5+ BEERS DAILY  . Drug use: Not Currently    Frequency: 3.0 times per week    Types: Cocaine     Allergies   Tomato and Aspirin   Review of Systems Review of Systems  Cardiovascular: Negative for chest pain.  Psychiatric/Behavioral: Positive for suicidal ideas.  All other systems reviewed and are negative.    Physical Exam Updated Vital Signs BP 125/77 (BP Location: Left Arm)   Pulse 84   Temp 98.7 F (37.1 C) (Oral)   Resp 16   SpO2 97%   Physical Exam Vitals signs and nursing note reviewed.  Constitutional:      Appearance: He is well-developed.  HENT:     Head: Normocephalic and atraumatic.     Mouth/Throat:     Mouth: Mucous membranes are dry.     Pharynx: Oropharynx is clear.  Neck:     Musculoskeletal: Normal range of motion.  Cardiovascular:     Rate and Rhythm: Normal rate.  Pulmonary:     Effort: Pulmonary effort is normal. No respiratory distress.  Abdominal:     General: There is no distension.  Musculoskeletal: Normal range of motion.  Skin:    General: Skin is warm and dry.  Neurological:     General: No focal deficit present.     Mental Status: He is alert.  Psychiatric:     Comments: Intoxicated, endorses vague SI,      ED Treatments / Results  Labs (all labs ordered are listed, but only abnormal results are displayed) Labs Reviewed  COMPREHENSIVE METABOLIC PANEL - Abnormal; Notable for the following components:      Result Value   Potassium 3.3 (*)    CO2 21 (*)     Glucose, Bld 106 (*)    BUN <5 (*)    Calcium 8.4 (*)    AST 204 (*)    Alkaline Phosphatase 179 (*)    Total Bilirubin 1.5 (*)    All other components within normal limits  ETHANOL - Abnormal; Notable for the following components:   Alcohol, Ethyl (B) 373 (*)    All other components within normal limits  ACETAMINOPHEN LEVEL - Abnormal; Notable for the following components:   Acetaminophen (Tylenol), Serum <10 (*)    All other components within normal limits  CBC - Abnormal; Notable for the following components:   WBC 3.8 (*)    RBC 3.47 (*)    Hemoglobin 10.9 (*)    HCT 33.4 (*)    RDW 16.3 (*)    Platelets 51 (*)    All other components within normal limits  SALICYLATE LEVEL  RAPID URINE DRUG  SCREEN, HOSP PERFORMED    EKG None  Radiology No results found.  Procedures Procedures (including critical care time)  Medications Ordered in ED Medications - No data to display   Initial Impression / Assessment and Plan / ED Course  I have reviewed the triage vital signs and the nursing notes.  Pertinent labs & imaging results that were available during my care of the patient were reviewed by me and considered in my medical decision making (see chart for details).       Labs, workup and presentation similar to baseline. Will allow to sober up and reeval for suicidal thoughts and the need for TTS or not. When sober, no longer suicidal. Stable for dc.   Final Clinical Impressions(s) / ED Diagnoses   Final diagnoses:  Alcohol abuse    ED Discharge Orders    None       Yordin Rhoda, Corene Cornea, MD 11/17/18 [phone]

## 2018-11-19 ENCOUNTER — Other Ambulatory Visit: Payer: Self-pay

## 2018-11-19 ENCOUNTER — Emergency Department (HOSPITAL_COMMUNITY)
Admission: EM | Admit: 2018-11-19 | Discharge: 2018-11-20 | Disposition: A | Discharge status: Home / Self Care | Payer: Self-pay | Attending: Emergency Medicine | Admitting: Emergency Medicine

## 2018-11-19 DIAGNOSIS — I10 Essential (primary) hypertension: Secondary | ICD-10-CM | POA: Insufficient documentation

## 2018-11-19 DIAGNOSIS — Y908 Blood alcohol level of 240 mg/100 ml or more: Secondary | ICD-10-CM | POA: Insufficient documentation

## 2018-11-19 DIAGNOSIS — Z046 Encounter for general psychiatric examination, requested by authority: Secondary | ICD-10-CM | POA: Insufficient documentation

## 2018-11-19 DIAGNOSIS — F1024 Alcohol dependence with alcohol-induced mood disorder: Secondary | ICD-10-CM | POA: Diagnosis present

## 2018-11-19 DIAGNOSIS — R45851 Suicidal ideations: Secondary | ICD-10-CM | POA: Insufficient documentation

## 2018-11-19 DIAGNOSIS — F329 Major depressive disorder, single episode, unspecified: Secondary | ICD-10-CM | POA: Insufficient documentation

## 2018-11-19 DIAGNOSIS — F10929 Alcohol use, unspecified with intoxication, unspecified: Secondary | ICD-10-CM | POA: Insufficient documentation

## 2018-11-19 DIAGNOSIS — F17210 Nicotine dependence, cigarettes, uncomplicated: Secondary | ICD-10-CM | POA: Insufficient documentation

## 2018-11-19 LAB — COMPREHENSIVE METABOLIC PANEL
ALT: 40 U/L (ref 0–44)
AST: 206 U/L — ABNORMAL HIGH (ref 15–41)
Albumin: 3.5 g/dL (ref 3.5–5.0)
Alkaline Phosphatase: 195 U/L — ABNORMAL HIGH (ref 38–126)
Anion gap: 13 (ref 5–15)
BUN: 5 mg/dL — ABNORMAL LOW (ref 6–20)
CO2: 22 mmol/L (ref 22–32)
Calcium: 8.4 mg/dL — ABNORMAL LOW (ref 8.9–10.3)
Chloride: 101 mmol/L (ref 98–111)
Creatinine, Ser: 0.57 mg/dL — ABNORMAL LOW (ref 0.61–1.24)
GFR calc Af Amer: >60 mL/min (ref >60)
GFR calc non Af Amer: >60 mL/min (ref >60)
Glucose, Bld: 95 mg/dL (ref 70–99)
Potassium: 3.3 mmol/L — ABNORMAL LOW (ref 3.5–5.1)
Sodium: 136 mmol/L (ref 135–145)
Total Bilirubin: 1.3 mg/dL — ABNORMAL HIGH (ref 0.3–1.2)
Total Protein: 7.8 g/dL (ref 6.5–8.1)

## 2018-11-19 LAB — RAPID URINE DRUG SCREEN, HOSP PERFORMED
Amphetamines: NOT DETECTED
Barbiturates: NOT DETECTED
Benzodiazepines: NOT DETECTED
Cocaine: NOT DETECTED
Opiates: NOT DETECTED
Tetrahydrocannabinol: NOT DETECTED

## 2018-11-19 LAB — CBC
HCT: 33.9 % — ABNORMAL LOW (ref 39.0–52.0)
Hemoglobin: 11.1 g/dL — ABNORMAL LOW (ref 13.0–17.0)
MCH: 31.3 pg (ref 26.0–34.0)
MCHC: 32.7 g/dL (ref 30.0–36.0)
MCV: 95.5 fL (ref 80.0–100.0)
Platelets: 41 10*3/uL — ABNORMAL LOW (ref 150–400)
RBC: 3.55 MIL/uL — ABNORMAL LOW (ref 4.22–5.81)
RDW: 15.9 % — ABNORMAL HIGH (ref 11.5–15.5)
WBC: 3.9 10*3/uL — ABNORMAL LOW (ref 4.0–10.5)
nRBC: 0 % (ref 0.0–0.2)

## 2018-11-19 LAB — ACETAMINOPHEN LEVEL: Acetaminophen (Tylenol), Serum: <10 ug/mL — ABNORMAL LOW (ref 10–30)

## 2018-11-19 LAB — ETHANOL: Alcohol, Ethyl (B): 430 mg/dL (ref <10)

## 2018-11-19 LAB — SALICYLATE LEVEL: Salicylate Lvl: <7 mg/dL (ref 2.8–30.0)

## 2018-11-19 MED ORDER — LORAZEPAM 1 MG PO TABS
0.0000 mg | ORAL_TABLET | Freq: Four times a day (QID) | ORAL | Status: DC
  Administered 2018-11-20 (×2): 2 mg via ORAL
  Filled 2018-11-19 (×2): qty 2

## 2018-11-19 MED ORDER — LORAZEPAM 1 MG PO TABS: 0.0000 mg | ORAL_TABLET | Freq: Two times a day (BID) | ORAL | Status: DC

## 2018-11-19 MED ORDER — IBUPROFEN 200 MG PO TABS: 600.0000 mg | ORAL_TABLET | Freq: Three times a day (TID) | ORAL | Status: DC | PRN

## 2018-11-19 MED ORDER — ALUM & MAG HYDROXIDE-SIMETH 200-200-20 MG/5ML PO SUSP: 30.0000 mL | Freq: Four times a day (QID) | ORAL | Status: DC | PRN

## 2018-11-19 MED ORDER — LORAZEPAM 2 MG/ML IJ SOLN: 0.0000 mg | Freq: Four times a day (QID) | INTRAMUSCULAR | Status: DC

## 2018-11-19 MED ORDER — VITAMIN B-1 100 MG PO TABS
100.0000 mg | ORAL_TABLET | Freq: Every day | ORAL | Status: DC
  Administered 2018-11-20: 100 mg via ORAL
  Filled 2018-11-19: qty 1

## 2018-11-19 MED ORDER — LORAZEPAM 2 MG/ML IJ SOLN: 0.0000 mg | Freq: Two times a day (BID) | INTRAMUSCULAR | Status: DC

## 2018-11-19 MED ORDER — THIAMINE HCL 100 MG/ML IJ SOLN: 100.0000 mg | Freq: Every day | INTRAMUSCULAR | Status: DC

## 2018-11-19 MED ORDER — ONDANSETRON HCL 4 MG PO TABS: 4.0000 mg | ORAL_TABLET | Freq: Three times a day (TID) | ORAL | Status: DC | PRN

## 2018-11-19 NOTE — ED Provider Notes (Signed)
[facility] DEPT Provider Note   CSN: [id number] Arrival date & time: 11/19/18  1929     History   Chief Complaint Chief Complaint  Patient presents with  . Alcohol Intoxication  . Suicidal    HPI Lauriano Czapla is a 53 y.o. male.     The history is provided by the patient. No language interpreter was used.  Depression This is a new problem. The current episode started more than 1 week ago. The problem occurs constantly. The problem has not changed since onset.Nothing aggravates the symptoms. Nothing relieves the symptoms. He has tried nothing for the symptoms. The treatment provided moderate relief.  Pt complains of depression.  Pt reports suicidal thoughts.  Pt reports he had a plan but security took his knife away.   Past Medical History:  Diagnosis Date  . Alcoholic hepatitis   . Depression   . ETOH abuse   . Gastric bleed 08/2018  . Hypertension   . Pancreatitis   . Seizures ([place])   . Suicidal behavior     Patient Active Problem List   Diagnosis Date Noted  . Homicidal ideation   . Gastrointestinal hemorrhage with melena 08/31/2018  . Colon ulcer   . Polyp of ascending colon   . Acute GI bleeding 08/05/2018  . Transaminitis 08/05/2018  . Abdominal pain 08/05/2018  . Nausea and vomiting 08/05/2018  . Substance induced mood disorder ([place]) 07/16/2017  . Severe recurrent major depression without psychotic features ([place]) 05/15/2017  . Anemia 03/18/2017  . Acute blood loss anemia 02/21/2017  . UGI bleed 02/08/2017  . Alcohol withdrawal ([place]) 02/08/2017  . Cocaine abuse ([place]) 11/18/2016  . GI bleed 11/01/2016  . Alcohol abuse   . Major depressive disorder, recurrent severe without psychotic features ([place]) 07/06/2016  . Alcohol-induced mood disorder ([place]) 06/15/2016  . Seizure ([place]) 05/26/2016  . Tobacco abuse 05/26/2016  . Homeless 05/26/2016  . Essential hypertension 05/26/2016  . Alcohol dependence with alcohol-induced mood  disorder ([place]) 02/14/2016  . Severe alcohol withdrawal without perceptual disturbances without complication ([place]) [id number]  . Alcoholic hepatitis without ascites 02/14/2016  . Thrombocytopenia ([place]) 02/14/2016    Past Surgical History:  Procedure Laterality Date  . BIOPSY  08/05/2018   Procedure: BIOPSY;  Surgeon: Irving Copas., MD;  Location: [facility];  Service: Gastroenterology;;  . BIOPSY  08/07/2018   Procedure: BIOPSY;  Surgeon: Thornton Park, MD;  Location: [place];  Service: Gastroenterology;;  . COLONOSCOPY WITH PROPOFOL N/A 08/07/2018   Procedure: COLONOSCOPY WITH PROPOFOL;  Surgeon: Thornton Park, MD;  Location: [place];  Service: Gastroenterology;  Laterality: N/A;  . ESOPHAGOGASTRODUODENOSCOPY (EGD) WITH PROPOFOL N/A 11/02/2016   Procedure: ESOPHAGOGASTRODUODENOSCOPY (EGD) WITH PROPOFOL;  Surgeon: Carol Ada, MD;  [facility];  Service: Endoscopy;  Laterality: N/A;  . ESOPHAGOGASTRODUODENOSCOPY (EGD) WITH PROPOFOL N/A 08/05/2018   Procedure: ESOPHAGOGASTRODUODENOSCOPY (EGD) WITH PROPOFOL;  Surgeon: Rush Landmark Telford Nab., MD;  Location: [place];  Service: Gastroenterology;  Laterality: N/A;  . HERNIA REPAIR    . LEG SURGERY    . POLYPECTOMY  08/07/2018   Procedure: POLYPECTOMY;  Surgeon: Thornton Park, MD;  Location: [facility] END[facility]COPY;  Service: Gastroenterology;;        Home Medications    Prior to Admission medications   Medication Sig Start Date End Date Taking? Authorizing Provider  ondansetron (ZOFRAN) 4 MG tablet Take 1 tablet (4 mg total) by mouth every 6 (six) hours. 11/04/18   McDonald, Mia A, PA-C  ferrous sulfate 325 (65  FE) MG tablet Take 1 tablet (325 mg total) by mouth daily with breakfast. Patient not taking: Reported on 09/23/2018 08/09/18 10/09/18  Elodia Florence., MD  pantoprazole (PROTONIX) 40 MG tablet Take 1 tablet (40 mg total) by mouth 2 (two) times daily. Patient not taking: Reported on 09/23/2018  08/09/18 10/09/18  Elodia Florence., MD    Family History Family History  Problem Relation Age of Onset  . Diabetes Mother   . Alcoholism Mother   . Emphysema Father   . Lung cancer Father   . Alcoholism Father     Social History Social History   Tobacco Use  . Smoking status: Current Every Day Smoker    Packs/day: 1.00    Types: Cigarettes  . Smokeless tobacco: Never Used  Substance Use Topics  . Alcohol use: Yes    Comment: 5+ BEERS DAILY  . Drug use: Not Currently    Frequency: 3.0 times per week    Types: Cocaine     Allergies   Tomato and Aspirin   Review of Systems Review of Systems  Psychiatric/Behavioral: Positive for depression.  All other systems reviewed and are negative.    Physical Exam Updated Vital Signs BP 132/84 (BP Location: Right Arm)   Pulse 82   Temp 99.2 F (37.3 C) (Oral)   Resp 20   Ht 5\' 10"  (1.778 m)   Wt 69.9 kg   SpO2 94%   BMI 22.10 kg/m   Physical Exam Vitals signs and nursing note reviewed.  Constitutional:      Appearance: He is well-developed.  HENT:     Head: Normocephalic and atraumatic.  Eyes:     Conjunctiva/sclera: Conjunctivae normal.  Neck:     Musculoskeletal: Neck supple.  Cardiovascular:     Rate and Rhythm: Normal rate and regular rhythm.     Heart sounds: No murmur.  Pulmonary:     Effort: Pulmonary effort is normal. No respiratory distress.     Breath sounds: Normal breath sounds.  Abdominal:     Palpations: Abdomen is soft.     Tenderness: There is no abdominal tenderness.  Skin:    General: Skin is warm and dry.  Neurological:     General: No focal deficit present.     Mental Status: He is alert.      ED Treatments / Results  Labs (all labs ordered are listed, but only abnormal results are displayed) Labs Reviewed  COMPREHENSIVE METABOLIC PANEL  ETHANOL  CBC  SALICYLATE LEVEL  ACETAMINOPHEN LEVEL  RAPID URINE DRUG SCREEN, HOSP PERFORMED    EKG None  Radiology No  results found.  Procedures Procedures (including critical care time)  Medications Ordered in ED Medications - No data to display   Initial Impression / Assessment and Plan / ED Course  I have reviewed the triage vital signs and the nursing notes.  Pertinent labs & imaging results that were available during my care of the patient were reviewed by me and considered in my medical decision making (see chart for details).        Labs ordered,  TTS consult.  TTS advised observe overnight and reassess in am.  Pt has etoh of 430  Final Clinical Impressions(s) / ED Diagnoses   Final diagnoses:  Alcoholic intoxication with complication [facility])  Suicidal ideations    ED Discharge Orders    None       Sidney Ace 11/19/18 2111    Daleen Bo, MD 11/20/18  1539  

## 2018-11-19 NOTE — ED Notes (Signed)
Date and time results received: 11/19/18 9:06 PM  (use smartphrase ".now" to insert current time)  Test: Ethenol Critical Value: 430  Name of Provider Notified: Santiago Glad, [place]  Orders Received? Or Actions Taken?:
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
## 2018-11-20 DIAGNOSIS — R45851 Suicidal ideations: Secondary | ICD-10-CM

## 2018-11-20 DIAGNOSIS — R45850 Homicidal ideations: Secondary | ICD-10-CM

## 2018-11-20 DIAGNOSIS — F1024 Alcohol dependence with alcohol-induced mood disorder: Secondary | ICD-10-CM

## 2018-11-20 NOTE — ED Notes (Signed)
Tele psych machine at bedside 

## 2018-11-20 NOTE — Patient Outreach (Signed)
CPSS met with the patient in order to provide substance use recovery support and help with getting connected to substance use treatment resources. Patient reports a history of alcohol use. CPSS talked the patient in depth about available substance use recovery resources that help patients who are uninsured. Some of these resources include residential/outpatient substance use treatment centers and outpatient substance use treatment centers that provide dual diagnosis treatment. CPSS also talked the patient about AA meetings in [place] and provided an in-person [place] meeting list. CPSS talked to the patient about the value of following up with substance use recovery resources in order to help establish a healthy, long-term substance use recovery process. Additionally, CPSS provided CPSS contact information. CPSS highly encouraged the patient follow up with CPSS if needed for further help with getting connected to substance use recovery resources.

## 2018-11-20 NOTE — Discharge Instructions (Signed)
To help you maintain a sober lifestyle, a substance abuse treatment program may be beneficial to you.  Contact one of the following facilities at your earliest opportunity to ask about enrolling:  RESIDENTIAL PROGRAMS:       [facility]      [address]      [place], [place] [address]      [phone]       [facility]      [address]      [place], [place] [address]      [phone]       [facility]      [address]      [place], [place] [address]      [phone]  OUTPATIENT PROGRAMS:       [facility]      [address]      [place], [place] [address]      [phone]       New patients are seen at their walk-in clinic.  Walk-in hours are Monday - Friday from 8:30 am - 12:00 pm, and from 1:00 pm - 2:30 pm.  Walk-in patients are seen on a first come, first served basis, so try to arrive as early as possible for the best chance of being seen the same day. 

## 2018-11-20 NOTE — ED Notes (Signed)
Peer support at bedside 

## 2018-11-20 NOTE — ED Notes (Signed)
Pt d/c home per MD order. Discharge summary reviewed,  Ambulatory off unit. Personal property returned.

## 2018-11-20 NOTE — BH Assessment (Addendum)
[facility] Assessment Progress Note  Per Buford Dresser, DO, this pt does not require psychiatric hospitalization at this time.  Pt is to be discharged from [facility] with referral information for area substance abuse treatment providers.  This has been included in pt's discharge instructions.  Pt would also benefit from seeing Peer Support Specialists; they will be asked to speak to pt.  Pt's nurse, Caryl Pina, has been notified.  Jalene Mullet, Welcome Triage Specialist [phone]

## 2018-11-20 NOTE — Consult Note (Addendum)
[facility] Psych ED Progress Note  11/20/2018 3:59 PM Jumarion Barreca  MRN:  [id number] Subjective:  Patient alert and oriented x4, answered all questions appropriately. Patient verbalizes suicidal ideations x 2 days, chronic stressor "my whole family is dead." When questioned about frequency and amount of alcohol use patient answers "I don't know I keep losing count."Patient denies recent loss of family, friend's death x approx. 2 weeks ago. Patient endorses living in an apartment. Patient does not see outpatient psychiatrist at this time.  Patient denies current SI, patient denies AVH. Patient endorses HI toward a friend Carney Living) who "broke my collar bone." Per patient, "The police are aware of my desire to harm him." Patient confirms "I know this is against the law and I could be punished for acting on these thoughts." He does not have any intention to harm this person and does not know where this person is located.    Principal Problem: Alcohol dependence with alcohol-induced mood disorder (HCC) Diagnosis:  Principal Problem:   Alcohol dependence with alcohol-induced mood disorder ([place])  Total Time spent with patient: 20 minutes  Past Psychiatric History: Alcohol dependence with alcohol-induced mood disorder  Past Medical History:  Past Medical History:  Diagnosis Date  . Alcoholic hepatitis   . Depression   . ETOH abuse   . Gastric bleed 08/2018  . Hypertension   . Pancreatitis   . Seizures ([place])   . Suicidal behavior     Past Surgical History:  Procedure Laterality Date  . BIOPSY  08/05/2018   Procedure: BIOPSY;  Surgeon: Irving Copas., MD;  Location: [place];  Service: Gastroenterology;;  . BIOPSY  08/07/2018   Procedure: BIOPSY;  Surgeon: Thornton Park, MD;  Location: [place];  Service: Gastroenterology;;  . COLONOSCOPY WITH PROPOFOL N/A 08/07/2018   Procedure: COLONOSCOPY WITH PROPOFOL;  Surgeon: Thornton Park, MD;  Location: [place];  Service:  Gastroenterology;  Laterality: N/A;  . ESOPHAGOGASTRODUODENOSCOPY (EGD) WITH PROPOFOL N/A 11/02/2016   Procedure: ESOPHAGOGASTRODUODENOSCOPY (EGD) WITH PROPOFOL;  Surgeon: Carol Ada, MD;  Location: [facility];  Service: Endoscopy;  Laterality: N/A;  . ESOPHAGOGASTRODUODENOSCOPY (EGD) WITH PROPOFOL N/A 08/05/2018   Procedure: ESOPHAGOGASTRODUODENOSCOPY (EGD) WITH PROPOFOL;  Surgeon: Rush Landmark Telford Nab., MD;  Location: [place];  Service: Gastroenterology;  Laterality: N/A;  . HERNIA REPAIR    . LEG SURGERY    . POLYPECTOMY  08/07/2018   Procedure: POLYPECTOMY;  Surgeon: Thornton Park, MD;  Location: [facility] ENDOSCOPY;  Service: Gastroenterology;;   Family History:  Family History  Problem Relation Age of Onset  . Diabetes Mother   . Alcoholism Mother   . Emphysema Father   . Lung cancer Father   . Alcoholism Father    Family Psychiatric  History: Mother-alcoholism Social History:  Social History   Substance and Sexual Activity  Alcohol Use Yes   Comment: 5+ BEERS DAILY     Social History   Substance and Sexual Activity  Drug Use Not Currently  . Frequency: 3.0 times per week  . Types: Cocaine    Social History   Socioeconomic History  . Marital status: Divorced    Spouse name: Not on file  . Number of children: Not on file  . Years of education: Not on file  . Highest education level: Not on file  Occupational History  . Not on file  Social Needs  . Financial resource strain: Not on file  . Food insecurity    Worry: Not on file    Inability: Not on  file  . Transportation needs    Medical: Not on file    Non-medical: Not on file  Tobacco Use  . Smoking status: Current Every Day Smoker    Packs/day: 1.00    Types: Cigarettes  . Smokeless tobacco: Never Used  Substance and Sexual Activity  . Alcohol use: Yes    Comment: 5+ BEERS DAILY  . Drug use: Not Currently    Frequency: 3.0 times per week    Types: Cocaine  . Sexual activity: Not Currently   Lifestyle  . Physical activity    Days per week: Not on file    Minutes per session: Not on file  . Stress: Not on file  Relationships  . Social Herbalist on phone: Not on file    Gets together: Not on file    Attends religious service: Not on file    Active member of club or organization: Not on file    Attends meetings of clubs or organizations: Not on file    Relationship status: Not on file  Other Topics Concern  . Not on file  Social History Narrative  . Not on file    Sleep: Good  Appetite:  Good  Current Medications: No current facility-administered medications for this encounter.    Current Outpatient Medications  Medication Sig Dispense Refill  . ondansetron (ZOFRAN) 4 MG tablet Take 1 tablet (4 mg total) by mouth every 6 (six) hours. (Patient not taking: Reported on 11/19/2018) 12 tablet 0    Lab Results:  Results for orders placed or performed during the hospital encounter of 11/19/18 (from the past 48 hour(s))  Comprehensive metabolic panel     Status: Abnormal   Collection Time: 11/19/18  8:04 PM  Result Value Ref Range   Sodium 136 135 - 145 mmol/L   Potassium 3.3 (L) 3.5 - 5.1 mmol/L   Chloride 101 98 - 111 mmol/L   CO2 22 22 - 32 mmol/L   Glucose, Bld 95 70 - 99 mg/dL   BUN 5 (L) 6 - 20 mg/dL   Creatinine, Ser 0.57 (L) 0.61 - 1.24 mg/dL   Calcium 8.4 (L) 8.9 - 10.3 mg/dL   Total Protein 7.8 6.5 - 8.1 g/dL   Albumin 3.5 3.5 - 5.0 g/dL   AST 206 (H) 15 - 41 U/L   ALT 40 0 - 44 U/L   Alkaline Phosphatase 195 (H) 38 - 126 U/L   Total Bilirubin 1.3 (H) 0.3 - 1.2 mg/dL   GFR calc non Af Amer >60 >60 mL/min   GFR calc Af Amer >60 >60 mL/min   Anion gap 13 5 - 15    Comment: Performed at [facility], [place], [place] [address] [place], [place] [address]  Ethanol     Status: Abnormal   Collection Time: 11/19/18  8:04 PM  Result Value Ref Range   Alcohol, Ethyl (B) 430 (HH) <10 mg/dL    Comment: CRITICAL RESULT CALLED TO, READ BACK  BY AND VERIFIED WITH: HODGES,C RN @2106  ON 11/19/2018 JACKSON,K (NOTE) Lowest detectable limit for serum alcohol is 10 mg/dL. For medical purposes only. Performed at [facility], [place] [address] [place], [place] [address]   cbc     Status: Abnormal   Collection Time: 11/19/18  8:04 PM  Result Value Ref Range   WBC 3.9 (L) 4.0 - 10.5 K/uL   RBC 3.55 (L) 4.22 - 5.81 MIL/uL   Hemoglobin 11.1 (L) 13.0 - 17.0 g/dL  HCT 33.9 (L) 39.0 - 52.0 %   MCV 95.5 80.0 - 100.0 fL   MCH 31.3 26.0 - 34.0 pg   MCHC 32.7 30.0 - 36.0 g/dL   RDW 15.9 (H) 11.5 - 15.5 %   Platelets 41 (L) 150 - 400 K/uL    Comment: REPEATED TO VERIFY PLATELET COUNT CONFIRMED BY SMEAR Immature Platelet Fraction may be clinically indicated, consider ordering this additional test [id number]    nRBC 0.0 0.0 - 0.2 %    Comment: Performed at [facility], [place] [address], [place], [place] [id number]  Salicylate level     Status: None   Collection Time: 11/19/18  8:04 PM  Result Value Ref Range   Salicylate Lvl [id number] 2.8 - 30.0 mg/dL    Comment: Performed at [facility], [place] [address] [place], [place] [address]  Acetaminophen level     Status: Abnormal   Collection Time: 11/19/18  8:04 PM  Result Value Ref Range   Acetaminophen (Tylenol), Serum <10 (L) 10 - 30 ug/mL    Comment: (NOTE) Therapeutic concentrations vary significantly. A range of 10-30 ug/mL  may be an effective concentration for many patients. However, some  are best treated at concentrations outside of this range. Acetaminophen concentrations >150 ug/mL at 4 hours after ingestion  and >50 ug/mL at 12 hours after ingestion are often associated with  toxic reactions. Performed at [facility], [place] [address], [place], [place] [address]   Rapid urine drug screen (hospital performed)     Status: None   Collection Time: 11/19/18  8:04 PM  Result Value Ref Range   Opiates NONE  DETECTED NONE DETECTED   Cocaine NONE DETECTED NONE DETECTED   Benzodiazepines NONE DETECTED NONE DETECTED   Amphetamines NONE DETECTED NONE DETECTED   Tetrahydrocannabinol NONE DETECTED NONE DETECTED   Barbiturates NONE DETECTED NONE DETECTED    Comment: (NOTE) DRUG SCREEN FOR MEDICAL PURPOSES ONLY.  IF CONFIRMATION IS NEEDED FOR ANY PURPOSE, NOTIFY LAB WITHIN 5 DAYS. LOWEST DETECTABLE LIMITS FOR URINE DRUG SCREEN Drug Class                     Cutoff (ng/mL) Amphetamine and metabolites    1000 Barbiturate and metabolites    200 Benzodiazepine                 [id number] Tricyclics and metabolites     300 Opiates and metabolites        300 Cocaine and metabolites        300 THC                            50 Performed at [facility], [place] [address] [place], [place] [address]     Blood Alcohol level:  Lab Results  Component Value Date   ETH 430 [facility]) 11/19/2018   ETH 373 (HH) 11/16/2018    Physical Findings: AIMS:  , ,  ,  ,   N/A CIWA:  CIWA-Ar Total: 11 COWS:   N/A  Musculoskeletal: Strength & Muscle Tone: within normal limits Gait & Station: normal Patient leans: N/A  Psychiatric Specialty Exam: Physical Exam  Nursing note and vitals reviewed. Constitutional: He is oriented to person, place, and time. He appears well-developed.  HENT:  Head: Normocephalic.  Cardiovascular: Normal rate.  Respiratory: Effort normal.  Neurological: He is alert and oriented to person, place, and time.  Psychiatric: He has a normal mood and affect. His behavior is normal. Judgment and thought content normal.    Review of Systems  Constitutional: Negative.   HENT: Negative.   Eyes: Negative.   Respiratory: Negative.   Cardiovascular: Negative.   Gastrointestinal: Negative.   Genitourinary: Negative.   Musculoskeletal: Negative.   Skin: Negative.   Neurological: Negative.   Endo/Heme/Allergies: Negative.   Psychiatric/Behavioral: Positive for substance abuse  (alcohol use disorder). Negative for depression, hallucinations, memory loss and suicidal ideas. The patient is not nervous/anxious and does not have insomnia.     Blood pressure 135/84, pulse 87, temperature 98.4 F (36.9 C), temperature source Oral, resp. rate 16, height 5\' 10"  (1.778 m), weight 69.9 kg, SpO2 95 %.Body mass index is 22.1 kg/m.  General Appearance: Casual  Eye Contact:  Good  Speech:  Clear and Coherent  Volume:  Normal  Mood:  Depressed  Affect:  Appropriate  Thought Process:  Coherent and Descriptions of Associations: Intact  Orientation:  Full (Time, Place, and Person)  Thought Content:  Logical  Suicidal Thoughts:  No  Homicidal Thoughts:  No  Memory:  Immediate;   Good Recent;   Good Remote;   Good  Judgement:  Fair  Insight:  Fair  Psychomotor Activity:  Normal  Concentration:  Concentration: Good and Attention Span: Good  Recall:  Good  Fund of Knowledge:  Fair  Language:  Good  Akathisia:  No  Handed:  Right  AIMS (if indicated):   N/A  Assets:  Communication Skills Desire for Improvement Resilience  ADL's:  Intact  Cognition:  WNL  Sleep:   N/A      Treatment Plan Summary: Plan to discharge home. Peer support to contact for substance treatment options.  Emmaline Kluver, [place] 11/20/2018, 3:59 PM    Patient seen by telemedicine for psychiatric evaluation, chart reviewed and case discussed with the physician extender and developed treatment plan. Reviewed the information documented and agree with the treatment plan.  Buford Dresser, DO 11/20/18 4:57 PM

## 2018-12-12 ENCOUNTER — Emergency Department (HOSPITAL_COMMUNITY): Payer: Self-pay

## 2018-12-12 ENCOUNTER — Other Ambulatory Visit: Payer: Self-pay

## 2018-12-12 ENCOUNTER — Inpatient Hospital Stay (HOSPITAL_COMMUNITY)
Admission: EM | Admit: 2018-12-12 | Discharge: 2018-12-14 | DRG: 439 | Disposition: A | Discharge status: Home / Self Care | Payer: Self-pay | Attending: Internal Medicine | Admitting: Internal Medicine

## 2018-12-12 DIAGNOSIS — E876 Hypokalemia: Secondary | ICD-10-CM | POA: Diagnosis present

## 2018-12-12 DIAGNOSIS — Z91018 Allergy to other foods: Secondary | ICD-10-CM

## 2018-12-12 DIAGNOSIS — K852 Alcohol induced acute pancreatitis without necrosis or infection: Principal | ICD-10-CM | POA: Diagnosis present

## 2018-12-12 DIAGNOSIS — Z886 Allergy status to analgesic agent status: Secondary | ICD-10-CM

## 2018-12-12 DIAGNOSIS — Z590 Homelessness: Secondary | ICD-10-CM

## 2018-12-12 DIAGNOSIS — K760 Fatty (change of) liver, not elsewhere classified: Secondary | ICD-10-CM | POA: Diagnosis present

## 2018-12-12 DIAGNOSIS — Z833 Family history of diabetes mellitus: Secondary | ICD-10-CM

## 2018-12-12 DIAGNOSIS — Y908 Blood alcohol level of 240 mg/100 ml or more: Secondary | ICD-10-CM | POA: Diagnosis present

## 2018-12-12 DIAGNOSIS — D6959 Other secondary thrombocytopenia: Secondary | ICD-10-CM | POA: Diagnosis present

## 2018-12-12 DIAGNOSIS — R45851 Suicidal ideations: Secondary | ICD-10-CM | POA: Diagnosis present

## 2018-12-12 DIAGNOSIS — R109 Unspecified abdominal pain: Secondary | ICD-10-CM | POA: Diagnosis present

## 2018-12-12 DIAGNOSIS — D638 Anemia in other chronic diseases classified elsewhere: Secondary | ICD-10-CM | POA: Diagnosis present

## 2018-12-12 DIAGNOSIS — I10 Essential (primary) hypertension: Secondary | ICD-10-CM | POA: Diagnosis present

## 2018-12-12 DIAGNOSIS — Z8711 Personal history of peptic ulcer disease: Secondary | ICD-10-CM

## 2018-12-12 DIAGNOSIS — K7010 Alcoholic hepatitis without ascites: Secondary | ICD-10-CM | POA: Diagnosis present

## 2018-12-12 DIAGNOSIS — Z811 Family history of alcohol abuse and dependence: Secondary | ICD-10-CM

## 2018-12-12 DIAGNOSIS — F10229 Alcohol dependence with intoxication, unspecified: Secondary | ICD-10-CM | POA: Diagnosis present

## 2018-12-12 DIAGNOSIS — Z801 Family history of malignant neoplasm of trachea, bronchus and lung: Secondary | ICD-10-CM

## 2018-12-12 DIAGNOSIS — K766 Portal hypertension: Secondary | ICD-10-CM | POA: Diagnosis present

## 2018-12-12 DIAGNOSIS — Z86010 Personal history of colonic polyps: Secondary | ICD-10-CM

## 2018-12-12 DIAGNOSIS — F329 Major depressive disorder, single episode, unspecified: Secondary | ICD-10-CM | POA: Diagnosis present

## 2018-12-12 DIAGNOSIS — F10129 Alcohol abuse with intoxication, unspecified: Secondary | ICD-10-CM

## 2018-12-12 DIAGNOSIS — Z79899 Other long term (current) drug therapy: Secondary | ICD-10-CM

## 2018-12-12 DIAGNOSIS — F10239 Alcohol dependence with withdrawal, unspecified: Secondary | ICD-10-CM | POA: Diagnosis present

## 2018-12-12 DIAGNOSIS — Z825 Family history of asthma and other chronic lower respiratory diseases: Secondary | ICD-10-CM

## 2018-12-12 DIAGNOSIS — K859 Acute pancreatitis without necrosis or infection, unspecified: Secondary | ICD-10-CM | POA: Diagnosis present

## 2018-12-12 DIAGNOSIS — F17210 Nicotine dependence, cigarettes, uncomplicated: Secondary | ICD-10-CM | POA: Diagnosis present

## 2018-12-12 DIAGNOSIS — Z20828 Contact with and (suspected) exposure to other viral communicable diseases: Secondary | ICD-10-CM | POA: Diagnosis present

## 2018-12-12 DIAGNOSIS — R569 Unspecified convulsions: Secondary | ICD-10-CM | POA: Diagnosis present

## 2018-12-12 DIAGNOSIS — F1010 Alcohol abuse, uncomplicated: Secondary | ICD-10-CM | POA: Diagnosis present

## 2018-12-12 DIAGNOSIS — R296 Repeated falls: Secondary | ICD-10-CM | POA: Diagnosis present

## 2018-12-12 DIAGNOSIS — K668 Other specified disorders of peritoneum: Secondary | ICD-10-CM | POA: Diagnosis present

## 2018-12-12 LAB — CBC WITH DIFFERENTIAL/PLATELET
Abs Immature Granulocytes: 0.03 10*3/uL (ref 0.00–0.07)
Basophils Absolute: 0.1 10*3/uL (ref 0.0–0.1)
Basophils Relative: 1 %
Eosinophils Absolute: 0.1 10*3/uL (ref 0.0–0.5)
Eosinophils Relative: 1 %
HCT: 30.9 % — ABNORMAL LOW (ref 39.0–52.0)
Hemoglobin: 10 g/dL — ABNORMAL LOW (ref 13.0–17.0)
Immature Granulocytes: 1 %
Lymphocytes Relative: 26 %
Lymphs Abs: 1.1 10*3/uL (ref 0.7–4.0)
MCH: 31.9 pg (ref 26.0–34.0)
MCHC: 32.4 g/dL (ref 30.0–36.0)
MCV: 98.7 fL (ref 80.0–100.0)
Monocytes Absolute: 0.8 10*3/uL (ref 0.1–1.0)
Monocytes Relative: 18 %
Neutro Abs: 2.2 10*3/uL (ref 1.7–7.7)
Neutrophils Relative %: 53 %
Platelets: 67 10*3/uL — ABNORMAL LOW (ref 150–400)
RBC: 3.13 MIL/uL — ABNORMAL LOW (ref 4.22–5.81)
RDW: 15.4 % (ref 11.5–15.5)
WBC: 4.2 10*3/uL (ref 4.0–10.5)
nRBC: 0 % (ref 0.0–0.2)

## 2018-12-12 MED ORDER — METOCLOPRAMIDE HCL 5 MG/ML IJ SOLN
10.0000 mg | Freq: Once | INTRAMUSCULAR | Status: AC
  Administered 2018-12-12: 10 mg via INTRAVENOUS
  Filled 2018-12-12: qty 2

## 2018-12-12 NOTE — ED Provider Notes (Signed)
[facility] DEPT Provider Note   CSN: [id number] Arrival date & time: 12/12/18  2114     History   Chief Complaint Chief Complaint  Patient presents with   Seizures    HPI Barry Moore is a 53 y.o. male with a history of seizures, alcoholic hepatitis, alcohol abuse, GI bleed, hypertension, and homelessness who presents to the emergency department with a chief complaint of seizures.  The patient reports that he had multiple seizures earlier today.  He is having a posterior headache.  He denies biting his tongue.  He is endorsing pain in his left flank from a recent fall. He does not take any seizure medication.  He reports that he also had 2 episodes of vomiting earlier today.  He also notes that he has been having intractable hiccups for most of the day as well as over the last few days.  He also endorses generalized abdominal pain.  He is unable to characterize the pain and there is no aggravating or alleviating factors.  No diarrhea or constipation.  No recent fevers or chills.  He reports that he has been having a cough and shortness of breath for some time.  No chest pain, leg swelling, dizziness, lightheadedness.  He reports that he has been drinking alcohol today.  He endorsed to nursing staff that he has had suicidal thoughts for the last month, but does not have a current plan.     The history is provided by the patient and the EMS personnel. No language interpreter was used.    Past Medical History:  Diagnosis Date   Alcoholic hepatitis    Depression    ETOH abuse    Gastric bleed 08/2018   Hypertension    Pancreatitis    Seizures ([place])    Suicidal behavior     Patient Active Problem List   Diagnosis Date Noted   Acute pancreatitis 12/13/2018   Free intraperitoneal air 12/13/2018   Homicidal ideation    Gastrointestinal hemorrhage with melena 08/31/2018   Colon ulcer    Polyp of ascending colon    Acute GI  bleeding 08/05/2018   Transaminitis 08/05/2018   Abdominal pain 08/05/2018   Nausea and vomiting 08/05/2018   Substance induced mood disorder ([place]) 07/16/2017   Severe recurrent major depression without psychotic features ([place]) 05/15/2017   Anemia 03/18/2017   Acute blood loss anemia 02/21/2017   UGI bleed 02/08/2017   Alcohol withdrawal ([place]) 02/08/2017   Cocaine abuse ([place]) 11/18/2016   GI bleed 11/01/2016   Alcohol abuse    Major depressive disorder, 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/06/2016   Alcohol-induced mood disorder ([place]) 06/15/2016   Seizure ([place]) 05/26/2016   Tobacco abuse 05/26/2016   Homeless 05/26/2016   Essential hypertension 05/26/2016   Alcohol dependence with alcohol-induced mood disorder ([place]) 02/14/2016   Severe alcohol withdrawal without perceptual disturbances without complication ([place]) 60/12/9321   Alcoholic hepatitis without ascites 02/14/2016   Thrombocytopenia ([place]) 02/14/2016    Past Surgical History:  Procedure Laterality Date   BIOPSY  08/05/2018   Procedure: BIOPSY;  Surgeon: Irving Copas., MD;  Location: [facility];  Service: Gastroenterology;;   BIOPSY  08/07/2018   Procedure: BIOPSY;  Surgeon: Thornton Park, MD;  Location: [place];  Service: Gastroenterology;;   COLONOSCOPY WITH PROPOFOL N/A 08/07/2018   Procedure: COLONOSCOPY WITH PROPOFOL;  Surgeon: Thornton Park, MD;  Location: [place];  Service: Gastroenterology;  Laterality: N/A;   ESOPHAGOGASTRODUODENOSCOPY (EGD) WITH PROPOFOL N/A 11/02/2016  Procedure: ESOPHAGOGASTRODUODENOSCOPY (EGD) WITH PROPOFOL;  Surgeon: Carol Ada, MD;  Location: [facility];  Service: Endoscopy;  Laterality: N/A;   ESOPHAGOGASTRODUODENOSCOPY (EGD) WITH PROPOFOL N/A 08/05/2018   Procedure: ESOPHAGOGASTRODUODENOSCOPY (EGD) WITH PROPOFOL;  Surgeon: Rush Landmark Telford Nab., MD;  Location: [facility];  Service: Gastroenterology;  Laterality:  N/A;   HERNIA REPAIR     LEG SURGERY     POLYPECTOMY  08/07/2018   Procedure: POLYPECTOMY;  Surgeon: Thornton Park, MD;  Location: [facility] ENDOSCOPY;  Service: Gastroenterology;;        Home Medications    Prior to Admission medications   Medication Sig Start Date End Date Taking? Authorizing Provider  ondansetron (ZOFRAN) 4 MG tablet Take 1 tablet (4 mg total) by mouth every 6 (six) hours. Patient not taking: Reported on 11/19/2018 11/04/18   Jenniger Figiel A, PA-C  ferrous sulfate 325 (65 FE) MG tablet Take 1 tablet (325 mg total) by mouth daily with breakfast. Patient not taking: Reported on 09/23/2018 08/09/18 10/09/18  Elodia Florence., MD  pantoprazole (PROTONIX) 40 MG tablet Take 1 tablet (40 mg total) by mouth 2 (two) times daily. Patient not taking: Reported on 09/23/2018 08/09/18 10/09/18  Elodia Florence., MD    Family History Family History  Problem Relation Age of Onset   Diabetes Mother    Alcoholism Mother    Emphysema Father    Lung cancer Father    Alcoholism Father     Social History Social History   Tobacco Use   Smoking status: Current Every Day Smoker    Packs/day: 1.00    Types: Cigarettes   Smokeless tobacco: Never Used  Substance Use Topics   Alcohol use: Yes    Comment: 5+ BEERS DAILY   Drug use: Not Currently    Frequency: 3.0 times per week    Types: Cocaine     Allergies   Tomato and Aspirin   Review of Systems Review of Systems  Constitutional: Negative for appetite change, chills, diaphoresis and fever.  HENT: Negative for congestion and sore throat.   Eyes: Negative for visual disturbance.  Respiratory: Positive for shortness of breath. Negative for cough and wheezing.   Cardiovascular: Positive for chest pain. Negative for palpitations.  Gastrointestinal: Positive for abdominal pain and vomiting. Negative for blood in stool, constipation, diarrhea and nausea.  Genitourinary: Positive for flank pain. Negative for  discharge, dysuria and urgency.  Musculoskeletal: Positive for neck pain. Negative for back pain, myalgias and neck stiffness.  Skin: Negative for rash.  Allergic/Immunologic: Negative for immunocompromised state.  Neurological: Positive for seizures and headaches. Negative for dizziness, syncope and weakness.  Psychiatric/Behavioral: Negative for confusion.     Physical Exam Updated Vital Signs BP 134/82 (BP Location: Left Arm)    Pulse 85    Temp 98.5 F (36.9 C) (Oral)    Resp 18    Ht _0  (1.778 m)    Wt 70.6 kg    SpO2 98%    BMI 22.33 kg/m   Physical Exam Vitals signs and nursing note reviewed.  Constitutional:      Appearance: He is well-developed.     Comments: Uncooperative  HENT:     Head: Normocephalic.  Eyes:     Conjunctiva/sclera: Conjunctivae normal.  Neck:     Musculoskeletal: Neck supple.  Cardiovascular:     Rate and Rhythm: Normal rate and regular rhythm.     Heart sounds: No murmur.  Pulmonary:     Effort: Pulmonary effort is normal.  No respiratory distress.     Breath sounds: No wheezing or rales.  Chest:     Chest wall: Tenderness present.  Abdominal:     General: There is no distension.     Palpations: Abdomen is soft.     Tenderness: There is abdominal tenderness. There is no right CVA tenderness or left CVA tenderness.     Comments: Hiccuping with almost paradoxical appearing retractions of the abdomen. Diffusely tender throughout the abdomen with minimal palpation. Bruising is noted to the left flank, just superior to the ASIS    Skin:    General: Skin is warm and dry.     Coloration: Skin is not jaundiced.  Neurological:     Mental Status: He is alert.  Psychiatric:        Behavior: Behavior normal.    ED Treatments / Results  Labs (all labs ordered are listed, but only abnormal results are displayed) Labs Reviewed  CBC WITH DIFFERENTIAL/PLATELET - Abnormal; Notable for the following components:      Result Value   RBC 3.13 (*)     Hemoglobin 10.0 (*)    HCT 30.9 (*)    Platelets 67 (*)    All other components within normal limits  COMPREHENSIVE METABOLIC PANEL - Abnormal; Notable for the following components:   Potassium 3.3 (*)    Glucose, Bld 100 (*)    BUN 5 (*)    Creatinine, Ser 0.44 (*)    Calcium 8.2 (*)    Albumin 3.2 (*)    AST 116 (*)    Alkaline Phosphatase 244 (*)    Total Bilirubin 1.7 (*)    All other components within normal limits  LIPASE, BLOOD - Abnormal; Notable for the following components:   Lipase 58 (*)    All other components within normal limits  ETHANOL - Abnormal; Notable for the following components:   Alcohol, Ethyl (B) 323 (*)    All other components within normal limits  ACETAMINOPHEN LEVEL - Abnormal; Notable for the following components:   Acetaminophen (Tylenol), Serum <10 (*)    All other components within normal limits  BASIC METABOLIC PANEL - Abnormal; Notable for the following components:   Potassium 3.2 (*)    BUN <5 (*)    Creatinine, Ser 0.51 (*)    Calcium 8.0 (*)    All other components within normal limits  CBC - Abnormal; Notable for the following components:   WBC 2.7 (*)    RBC 2.95 (*)    Hemoglobin 9.6 (*)    HCT 29.3 (*)    Platelets 61 (*)    All other components within normal limits  SARS CORONAVIRUS 2 (TAT 6-24 HRS)  URINALYSIS, ROUTINE W REFLEX MICROSCOPIC  SALICYLATE LEVEL  MAGNESIUM  PHOSPHORUS    EKG None  Radiology Dg Chest 2 View  Result Date: 12/12/2018 CLINICAL DATA:  Mid chest and left chest pain. Shortness of breath, cough EXAM: CHEST - 2 VIEW COMPARISON:  10/08/2018 FINDINGS: Heart is normal size. Linear densities in the lung bases, likely atelectasis. No effusions. No acute bony abnormality. IMPRESSION: Bibasilar atelectasis. Electronically Signed   By: Rolm Baptise M.D.   On: 12/12/2018 23:26   Ct Head Wo Contrast  Result Date: 12/13/2018 CLINICAL DATA:  53 year old male with posttraumatic headache and seizures. EXAM: CT  HEAD WITHOUT CONTRAST CT CERVICAL SPINE WITHOUT CONTRAST TECHNIQUE: Multidetector CT imaging of the head and cervical spine was performed following the standard protocol without intravenous contrast. Multiplanar CT  image reconstructions of the cervical spine were also generated. COMPARISON:  Head CT dated 10/08/2018 FINDINGS: CT HEAD FINDINGS Brain: Mild age-related atrophy and chronic microvascular ischemic changes. There is no acute intracranial hemorrhage. No mass effect or midline shift. No extra-axial fluid collection. Vascular: No hyperdense vessel or unexpected calcification. Skull: Normal. Negative for fracture or focal lesion. Sinuses/Orbits: No acute finding. Other: None CT CERVICAL SPINE FINDINGS Alignment: No acute subluxation. Skull base and vertebrae: No acute fracture. Soft tissues and spinal canal: No prevertebral fluid or swelling. No visible canal hematoma. Disc levels:  No acute findings. Mild degenerative changes. Upper chest: Negative. Other: Mild bilateral carotid bulb calcified plaques. IMPRESSION: 1. No acute intracranial hemorrhage. 2. No acute/traumatic cervical spine pathology. Electronically Signed   By: Anner Crete M.D.   On: 12/13/2018 01:52   Ct Cervical Spine Wo Contrast  Result Date: 12/13/2018 CLINICAL DATA:  53 year old male with posttraumatic headache and seizures. EXAM: CT HEAD WITHOUT CONTRAST CT CERVICAL SPINE WITHOUT CONTRAST TECHNIQUE: Multidetector CT imaging of the head and cervical spine was performed following the standard protocol without intravenous contrast. Multiplanar CT image reconstructions of the cervical spine were also generated. COMPARISON:  Head CT dated 10/08/2018 FINDINGS: CT HEAD FINDINGS Brain: Mild age-related atrophy and chronic microvascular ischemic changes. There is no acute intracranial hemorrhage. No mass effect or midline shift. No extra-axial fluid collection. Vascular: No hyperdense vessel or unexpected calcification. Skull: Normal.  Negative for fracture or focal lesion. Sinuses/Orbits: No acute finding. Other: None CT CERVICAL SPINE FINDINGS Alignment: No acute subluxation. Skull base and vertebrae: No acute fracture. Soft tissues and spinal canal: No prevertebral fluid or swelling. No visible canal hematoma. Disc levels:  No acute findings. Mild degenerative changes. Upper chest: Negative. Other: Mild bilateral carotid bulb calcified plaques. IMPRESSION: 1. No acute intracranial hemorrhage. 2. No acute/traumatic cervical spine pathology. Electronically Signed   By: Anner Crete M.D.   On: 12/13/2018 01:52   Ct Abdomen Pelvis W Contrast  Result Date: 12/13/2018 CLINICAL DATA:  Generalized abdominal pain and vomiting. Seizure earlier today with left flank bruising. Intractable hiccups. History of pancreatitis with elevated lipase. EXAM: CT ABDOMEN AND PELVIS WITH CONTRAST TECHNIQUE: Multidetector CT imaging of the abdomen and pelvis was performed using the standard protocol following bolus administration of intravenous contrast. CONTRAST:  136m OMNIPAQUE IOHEXOL 300 MG/ML  SOLN COMPARISON:  Most recent CT 11/03/2018 FINDINGS: Lower chest: Subpleural atelectasis in the bases, right greater than left. No pleural fluid. Hepatobiliary: Hepatic steatosis. Slight nodular hepatic contour suggesting cirrhosis. Similar appearance of gallbladder wall thickening versus pericholecystic fluid prior exam. No calcified gallstone. Recannulated umbilical vein. Pancreas: Peripancreatic fat stranding about the head and uncinate process, slightly increased from prior exam. No pancreatic ductal dilatation. No peripancreatic fluid collection. No evidence pancreatic necrosis. Spleen: Normal in size without focal abnormality. Adrenals/Urinary Tract: Normal adrenal glands. No hydronephrosis or perinephric edema. Homogeneous renal enhancement with symmetric excretion on delayed phase imaging. Urinary bladder is partially distended. There is urinary bladder wall  thickening, similar to prior exam. Stomach/Bowel: Distal esophageal wall thickening. Nondistended stomach limiting assessment. No small bowel obstruction or inflammation. Appendix not visualized. Moderate stool throughout the colon without colonic wall thickening. Small volume of extraluminal mesenteric air adjacent to the transverse colon, series 2, image 93, without associated colonic wall thickening. No adjacent inflammatory change. Vascular/Lymphatic: Aortic atherosclerosis. No aneurysm. Portal and splenic veins are patent. Recannulization of the umbilical vein. Prominent lymph node adjacent to the pancreatic head measuring 9 mm, series 2, image  31, similar to prior exam. Reproductive: Prostate is unremarkable. Other: Small focal subcutaneous contusion involving the left lateral flank. No significant ascites. Small focus of extraluminal mesenteric air in the right mid abdomen, series 2, image 41. small fat containing hernia containing recannulated umbilical vein and venous collaterals. Musculoskeletal: There are no acute or suspicious osseous abnormalities. Critical Value/emergent results were called by telephone at the time of interpretation on 12/13/2018 at 2:14 am to PA [place] , who verbally acknowledged these results. IMPRESSION: 1. Peripancreatic fat stranding about the head and uncinate process suspicious for acute pancreatitis, slightly progressed from exam last month. No peripancreatic fluid collection or pancreatic necrosis. 2. Small volume of extraluminal mesenteric air adjacent to the transverse colon, without associated colonic wall thickening or adjacent inflammation. Underlying etiology and significance is uncertain. 3. Hepatic steatosis. Slight nodular hepatic contour suggesting cirrhosis. Recannulated umbilical vein consistent with portal hypertension. 4. Similar appearance of gallbladder to prior with wall thickening versus pericholecystic fluid. 5. Chronic urinary bladder wall thickening.  6. Small focal subcutaneous contusion involving the left lateral flank. 7. Small fat containing hernia. 8. Distal esophageal wall thickening. Aortic Atherosclerosis (ICD10-I70.0). Electronically Signed   By: Keith Rake M.D.   On: 12/13/2018 02:14    Procedures Procedures (including critical care time)  Medications Ordered in ED Medications  sodium chloride (PF) 0.9 % injection (has no administration in time range)  lactated ringers infusion ( Intravenous Rate/Dose Verify 12/13/18 0600)  piperacillin-tazobactam (ZOSYN) IVPB 3.375 g (has no administration in time range)  LORazepam (ATIVAN) tablet 1-4 mg ( Oral See Alternative 12/13/18 0444)    Or  LORazepam (ATIVAN) injection 1-4 mg (2 mg Intravenous Given 12/13/18 0444)  thiamine (VITAMIN B-1) tablet 100 mg (has no administration in time range)    Or  thiamine (B-1) injection 100 mg (has no administration in time range)  folic acid (FOLVITE) tablet 1 mg (has no administration in time range)  multivitamin with minerals tablet 1 tablet (has no administration in time range)  acetaminophen (TYLENOL) tablet 650 mg (has no administration in time range)    Or  acetaminophen (TYLENOL) suppository 650 mg (has no administration in time range)  ondansetron (ZOFRAN) tablet 4 mg (has no administration in time range)    Or  ondansetron (ZOFRAN) injection 4 mg (has no administration in time range)  LORazepam (ATIVAN) injection 0-4 mg (0 mg Intravenous Not Given 12/13/18 7893)    Followed by  LORazepam (ATIVAN) injection 0-4 mg (has no administration in time range)  fentaNYL (SUBLIMAZE) injection 25 mcg (has no administration in time range)  pantoprazole (PROTONIX) injection 40 mg (has no administration in time range)  metoCLOPramide (REGLAN) injection 10 mg (10 mg Intravenous Given 12/12/18 2342)  iohexol (OMNIPAQUE) 300 MG/ML solution 100 mL (100 mLs Intravenous Contrast Given 12/13/18 0128)  fentaNYL (SUBLIMAZE) injection 75 mcg (75 mcg Intravenous  Given 12/13/18 0300)  lactated ringers bolus 1,000 mL (1,000 mLs Intravenous Bolus from Bag 12/13/18 0326)  piperacillin-tazobactam (ZOSYN) IVPB 3.375 g (0 g Intravenous Stopped 12/13/18 0325)     Initial Impression / Assessment and Plan / ED Course  I have reviewed the triage vital signs and the nursing notes.  Pertinent labs & imaging results that were available during my care of the patient were reviewed by me and considered in my medical decision making (see chart for details).        53 year old male with a history of seizures, alcoholic hepatitis, alcohol abuse, GI bleed, and hypertension who presents to  the emergency department with complaints of seizure activity.  He has been observed in the ER for several hours with no evidence of seizure activity.  He does appear intoxicated and ethanol level is 323, improved from previous.  He is well-known to this ER with 18 visits in the last 6 months.  He typically arrives with complaints of seizure-like activity, but typically does not have seizures throughout his ER visit, but is typically found to be very intoxicated.  On exam, he has diffusely tender throughout the abdomen and is noted to be having hiccups.  He has bruising on his left flank from a recent fall.  Chest x-ray is unremarkable.  Head CT and cervical spine are negative and respect recent trauma with no acute findings.  Alk phos is elevated at 244 and total bilirubin is 1.7, up from 1.3 on previous visit.  AST is elevated at 116, but improved from previous.  The patient was most recently seen and evaluated last month with abdominal pain and vomiting and was found to have chronic pancreatitis and cholecystitis.  His symptoms improved and he was discharged home.  His lipase today is 58.  CT abdomen pelvis with peripancreatic fat stranding around the head and uncinate process concerning for acute pancreatitis, progressed from CT last month.  No fluid collection or pancreatic necrosis.  There  is also a small volume of free air adjacent to the transverse colon with no obvious underlying etiology or significance.  He has hepatic steatosis and cirrhosis with portal hypertension.  Gallbladder appears similar to previous exam.  There is a small focal subcutaneous contusion of the left lateral flank.  The patient was discussed with Dr. Betsey Holiday, attending physician who independently evaluated the patient.  Spoke with Dr. Ninfa Linden with general surgery.  No emergent intervention indicated at this time.  General surgery will evaluate the patient in the a.m.   We will give a small dose of fentanyl for pain control.  CIWA score has been ordered as the patient has a history of heavy alcohol use and is at high risk for withdrawal.  Will place Ativan orders as needed given CIWA score given the patient's high ethanol level at this time.  Consulted the hospitalist team and Dr. Hal Hope will admit. The patient appears reasonably stabilized for admission considering the current resources, flow, and capabilities available in the ED at this time, and I doubt any other [facility] requiring further screening and/or treatment in the ED prior to admission.   Final Clinical Impressions(s) / ED Diagnoses   Final diagnoses:  Alcohol-induced acute pancreatitis, unspecified complication status  Free intraperitoneal air  Alcohol abuse with intoxication [facility])    ED Discharge Orders    None       Joanne Gavel, PA-C 12/13/18 0748    Orpah Greek, MD 12/14/18 [phone]

## 2018-12-12 NOTE — ED Triage Notes (Addendum)
Pt came by EMS after having multiple seizures. AOx4. Pt has hx of hep C, seizures, schizophrenia, and ETOH abuse. Pt is noncompliant with seizure meds. Pt c/o L side pain (from a fall, not related to seizure, yesterday). EMS reports tenderness when palpating L rib cage. Pt has 18LAC. Pt states he has had "some alcohol" tonight.

## 2018-12-12 NOTE — ED Notes (Signed)
Pt states he has thought about committing suicide in the last month but does not have a current plan. States "it will be pretty simple to do it".

## 2018-12-12 NOTE — ED Notes (Addendum)
Patient transported to X-Ray 

## 2018-12-13 ENCOUNTER — Emergency Department (HOSPITAL_COMMUNITY): Payer: Self-pay

## 2018-12-13 ENCOUNTER — Encounter (HOSPITAL_COMMUNITY): Payer: Self-pay

## 2018-12-13 DIAGNOSIS — K668 Other specified disorders of peritoneum: Secondary | ICD-10-CM

## 2018-12-13 DIAGNOSIS — R109 Unspecified abdominal pain: Secondary | ICD-10-CM

## 2018-12-13 DIAGNOSIS — F1010 Alcohol abuse, uncomplicated: Secondary | ICD-10-CM

## 2018-12-13 DIAGNOSIS — K859 Acute pancreatitis without necrosis or infection, unspecified: Secondary | ICD-10-CM

## 2018-12-13 LAB — URINALYSIS, ROUTINE W REFLEX MICROSCOPIC
Bilirubin Urine: NEGATIVE
Glucose, UA: NEGATIVE mg/dL
Hgb urine dipstick: NEGATIVE
Ketones, ur: NEGATIVE mg/dL
Leukocytes,Ua: NEGATIVE
Nitrite: NEGATIVE
Protein, ur: NEGATIVE mg/dL
Specific Gravity, Urine: 1.008 (ref 1.005–1.030)
pH: 6 (ref 5.0–8.0)

## 2018-12-13 LAB — CBC
HCT: 29.3 % — ABNORMAL LOW (ref 39.0–52.0)
Hemoglobin: 9.6 g/dL — ABNORMAL LOW (ref 13.0–17.0)
MCH: 32.5 pg (ref 26.0–34.0)
MCHC: 32.8 g/dL (ref 30.0–36.0)
MCV: 99.3 fL (ref 80.0–100.0)
Platelets: 61 10*3/uL — ABNORMAL LOW (ref 150–400)
RBC: 2.95 MIL/uL — ABNORMAL LOW (ref 4.22–5.81)
RDW: 15.1 % (ref 11.5–15.5)
WBC: 2.7 10*3/uL — ABNORMAL LOW (ref 4.0–10.5)
nRBC: 0 % (ref 0.0–0.2)

## 2018-12-13 LAB — BASIC METABOLIC PANEL
Anion gap: 11 (ref 5–15)
BUN: <5 mg/dL — ABNORMAL LOW (ref 6–20)
CO2: 23 mmol/L (ref 22–32)
Calcium: 8 mg/dL — ABNORMAL LOW (ref 8.9–10.3)
Chloride: 102 mmol/L (ref 98–111)
Creatinine, Ser: 0.51 mg/dL — ABNORMAL LOW (ref 0.61–1.24)
GFR calc Af Amer: >60 mL/min (ref >60)
GFR calc non Af Amer: >60 mL/min (ref >60)
Glucose, Bld: 89 mg/dL (ref 70–99)
Potassium: 3.2 mmol/L — ABNORMAL LOW (ref 3.5–5.1)
Sodium: 136 mmol/L (ref 135–145)

## 2018-12-13 LAB — PHOSPHORUS: Phosphorus: 3.4 mg/dL (ref 2.5–4.6)

## 2018-12-13 LAB — ETHANOL: Alcohol, Ethyl (B): 323 mg/dL (ref <10)

## 2018-12-13 LAB — COMPREHENSIVE METABOLIC PANEL
ALT: 26 U/L (ref 0–44)
AST: 116 U/L — ABNORMAL HIGH (ref 15–41)
Albumin: 3.2 g/dL — ABNORMAL LOW (ref 3.5–5.0)
Alkaline Phosphatase: 244 U/L — ABNORMAL HIGH (ref 38–126)
Anion gap: 10 (ref 5–15)
BUN: 5 mg/dL — ABNORMAL LOW (ref 6–20)
CO2: 26 mmol/L (ref 22–32)
Calcium: 8.2 mg/dL — ABNORMAL LOW (ref 8.9–10.3)
Chloride: 100 mmol/L (ref 98–111)
Creatinine, Ser: 0.44 mg/dL — ABNORMAL LOW (ref 0.61–1.24)
GFR calc Af Amer: >60 mL/min (ref >60)
GFR calc non Af Amer: >60 mL/min (ref >60)
Glucose, Bld: 100 mg/dL — ABNORMAL HIGH (ref 70–99)
Potassium: 3.3 mmol/L — ABNORMAL LOW (ref 3.5–5.1)
Sodium: 136 mmol/L (ref 135–145)
Total Bilirubin: 1.7 mg/dL — ABNORMAL HIGH (ref 0.3–1.2)
Total Protein: 7.7 g/dL (ref 6.5–8.1)

## 2018-12-13 LAB — ACETAMINOPHEN LEVEL: Acetaminophen (Tylenol), Serum: <10 ug/mL — ABNORMAL LOW (ref 10–30)

## 2018-12-13 LAB — SARS CORONAVIRUS 2 (TAT 6-24 HRS): SARS Coronavirus 2: NEGATIVE

## 2018-12-13 LAB — MAGNESIUM: Magnesium: 2.3 mg/dL (ref 1.7–2.4)

## 2018-12-13 LAB — LIPASE, BLOOD: Lipase: 58 U/L — ABNORMAL HIGH (ref 11–51)

## 2018-12-13 LAB — SALICYLATE LEVEL: Salicylate Lvl: <7 mg/dL (ref 2.8–30.0)

## 2018-12-13 MED ORDER — ACETAMINOPHEN 650 MG RE SUPP: 650.0000 mg | Freq: Four times a day (QID) | RECTAL | Status: DC | PRN

## 2018-12-13 MED ORDER — SODIUM CHLORIDE (PF) 0.9 % IJ SOLN
INTRAMUSCULAR | Status: AC
  Filled 2018-12-13: qty 50

## 2018-12-13 MED ORDER — FOLIC ACID 1 MG PO TABS
1.0000 mg | ORAL_TABLET | Freq: Every day | ORAL | Status: DC
  Administered 2018-12-13 – 2018-12-14 (×2): 1 mg via ORAL
  Filled 2018-12-13 (×2): qty 1

## 2018-12-13 MED ORDER — LORAZEPAM 2 MG/ML IJ SOLN
0.0000 mg | Freq: Four times a day (QID) | INTRAMUSCULAR | Status: DC
  Administered 2018-12-13 (×2): 1 mg via INTRAVENOUS
  Filled 2018-12-13 (×2): qty 1

## 2018-12-13 MED ORDER — THIAMINE HCL 100 MG/ML IJ SOLN: 100.0000 mg | Freq: Every day | INTRAMUSCULAR | Status: DC

## 2018-12-13 MED ORDER — LORAZEPAM 1 MG PO TABS
1.0000 mg | ORAL_TABLET | ORAL | Status: DC | PRN
  Administered 2018-12-13: 2 mg via ORAL
  Filled 2018-12-13: qty 2

## 2018-12-13 MED ORDER — ONDANSETRON HCL 4 MG PO TABS: 4.0000 mg | ORAL_TABLET | Freq: Four times a day (QID) | ORAL | Status: DC | PRN

## 2018-12-13 MED ORDER — MAGNESIUM SULFATE 2 GM/50ML IV SOLN
2.0000 g | Freq: Once | INTRAVENOUS | Status: AC
  Administered 2018-12-13: 2 g via INTRAVENOUS
  Filled 2018-12-13: qty 50

## 2018-12-13 MED ORDER — PIPERACILLIN-TAZOBACTAM 3.375 G IVPB
3.3750 g | Freq: Three times a day (TID) | INTRAVENOUS | Status: DC
  Administered 2018-12-13 – 2018-12-14 (×3): 3.375 g via INTRAVENOUS
  Filled 2018-12-13 (×3): qty 50

## 2018-12-13 MED ORDER — PANTOPRAZOLE SODIUM 40 MG IV SOLR
40.0000 mg | Freq: Two times a day (BID) | INTRAVENOUS | Status: DC
  Administered 2018-12-13 – 2018-12-14 (×3): 40 mg via INTRAVENOUS
  Filled 2018-12-13 (×2): qty 40

## 2018-12-13 MED ORDER — VITAMIN B-1 100 MG PO TABS
100.0000 mg | ORAL_TABLET | Freq: Every day | ORAL | Status: DC
  Administered 2018-12-13 – 2018-12-14 (×2): 100 mg via ORAL
  Filled 2018-12-13 (×2): qty 1

## 2018-12-13 MED ORDER — IOHEXOL 300 MG/ML  SOLN
100.0000 mL | Freq: Once | INTRAMUSCULAR | Status: AC | PRN
  Administered 2018-12-13: 01:00:00 100 mL via INTRAVENOUS

## 2018-12-13 MED ORDER — LACTATED RINGERS IV BOLUS
1000.0000 mL | Freq: Once | INTRAVENOUS | Status: AC
  Administered 2018-12-13: 03:00:00 1000 mL via INTRAVENOUS

## 2018-12-13 MED ORDER — ACETAMINOPHEN 325 MG PO TABS: 650.0000 mg | ORAL_TABLET | Freq: Four times a day (QID) | ORAL | Status: DC | PRN

## 2018-12-13 MED ORDER — LORAZEPAM 2 MG/ML IJ SOLN
1.0000 mg | INTRAMUSCULAR | Status: DC | PRN
  Administered 2018-12-13: 2 mg via INTRAVENOUS
  Filled 2018-12-13: qty 1

## 2018-12-13 MED ORDER — ADULT MULTIVITAMIN W/MINERALS CH
1.0000 | ORAL_TABLET | Freq: Every day | ORAL | Status: DC
  Administered 2018-12-14: 1 via ORAL
  Filled 2018-12-13: qty 1

## 2018-12-13 MED ORDER — PIPERACILLIN-TAZOBACTAM 3.375 G IVPB 30 MIN
3.3750 g | Freq: Once | INTRAVENOUS | Status: AC
  Administered 2018-12-13: 3.375 g via INTRAVENOUS
  Filled 2018-12-13: qty 50

## 2018-12-13 MED ORDER — POTASSIUM CHLORIDE 10 MEQ/100ML IV SOLN
10.0000 meq | INTRAVENOUS | Status: AC
  Administered 2018-12-13 (×4): 10 meq via INTRAVENOUS
  Filled 2018-12-13 (×4): qty 100

## 2018-12-13 MED ORDER — LORAZEPAM 2 MG/ML IJ SOLN: 0.0000 mg | Freq: Two times a day (BID) | INTRAMUSCULAR | Status: DC

## 2018-12-13 MED ORDER — FENTANYL CITRATE (PF) 100 MCG/2ML IJ SOLN
25.0000 ug | INTRAMUSCULAR | Status: DC | PRN
  Administered 2018-12-13 (×2): 25 ug via INTRAVENOUS
  Filled 2018-12-13 (×2): qty 2

## 2018-12-13 MED ORDER — FENTANYL CITRATE (PF) 100 MCG/2ML IJ SOLN
75.0000 ug | Freq: Once | INTRAMUSCULAR | Status: AC
  Administered 2018-12-13: 75 ug via INTRAVENOUS
  Filled 2018-12-13: qty 2

## 2018-12-13 MED ORDER — ONDANSETRON HCL 4 MG/2ML IJ SOLN: 4.0000 mg | Freq: Four times a day (QID) | INTRAMUSCULAR | Status: DC | PRN

## 2018-12-13 MED ORDER — LACTATED RINGERS IV SOLN
INTRAVENOUS | Status: AC
  Administered 2018-12-13 (×2): via INTRAVENOUS

## 2018-12-13 NOTE — Plan of Care (Signed)
53 year old male with a history of alcohol abuse alcohol withdrawal seizures admitted with abdominal pain with acute pancreatitis mild with concern for extraluminal air around the transverse colon.  Seen by general surgery who does not feel there is any perforation as there is no stranding around his colon.  Patient continues with abdominal pain we will treat him with IV fluids n.p.o. and pain control and follow-up labs tomorrow and start diet tomorrow if labs are better.

## 2018-12-13 NOTE — H&P (Signed)
History and Physical    Barry Moore [id number] DOB: 12-Apr-1965 DOA: 12/12/2018  PCP: Antony Blackbird, MD  Patient coming from: Patient states he lives in an apartment.  Documents mention he is homeless.  Chief Complaint: Seizure.  HPI: Barry Moore is a 53 y.o. male with history of alcohol abuse and GI bleed who has had last EGD and colonoscopy in May 2020 presents to the ER with complaints of having at least 2 back-to-back seizure.  Also has been having recurrent falls recently.  Over the last 2 days he has been having abdominal pain across the upper part of the abdomen diffuse nonradiating with some nausea vomiting denies any diarrhea.  Given the symptoms patient comes to the ER.  Admits to drinking alcohol every day.  ED Course: In the ER on exam patient has abdominal tenderness in epigastric area with no rigidity.  Labs reveal creatinine of 1.4 lipase 58 AST 116 ALT 26 total bilirubin 1.7 hemoglobin 10 platelets 67 COVID-19 test is pending.  Alcohol level was 323.  CT abdomen pelvis and CT head and C-spine was done.  CT abdomen pelvis shows features concerning for peripancreatic fluid which is worsening.  Also there was some extraluminal air around the transverse colon for which surgery was consulted patient and started on empiric antibiotics.  Admitted for further management of acute pancreatitis with possible extraluminal air concerning for perforation.  Also potential alcohol withdrawal seizure.  On exam patient does have some ecchymotic areas on his left flank and left lower quadrant.  Review of Systems: As per HPI, rest all negative.   Past Medical History:  Diagnosis Date   Alcoholic hepatitis    Depression    ETOH abuse    Gastric bleed 08/2018   Hypertension    Pancreatitis    Seizures ([place])    Suicidal behavior     Past Surgical History:  Procedure Laterality Date   BIOPSY  08/05/2018   Procedure: BIOPSY;  Surgeon: Irving Copas., MD;  Location: [place];  Service: Gastroenterology;;   BIOPSY  08/07/2018   Procedure: BIOPSY;  Surgeon: Thornton Park, MD;  Location: [place];  Service: Gastroenterology;;   COLONOSCOPY WITH PROPOFOL N/A 08/07/2018   Procedure: COLONOSCOPY WITH PROPOFOL;  Surgeon: Thornton Park, MD;  Location: [place];  Service: Gastroenterology;  Laterality: N/A;   ESOPHAGOGASTRODUODENOSCOPY (EGD) WITH PROPOFOL N/A 11/02/2016   Procedure: ESOPHAGOGASTRODUODENOSCOPY (EGD) WITH PROPOFOL;  Surgeon: Carol Ada, MD;  Location: [facility];  Service: Endoscopy;  Laterality: N/A;   ESOPHAGOGASTRODUODENOSCOPY (EGD) WITH PROPOFOL N/A 08/05/2018   Procedure: ESOPHAGOGASTRODUODENOSCOPY (EGD) WITH PROPOFOL;  Surgeon: Rush Landmark Telford Nab., MD;  Location: [place];  Service: Gastroenterology;  Laterality: N/A;   HERNIA REPAIR     LEG SURGERY     POLYPECTOMY  08/07/2018   Procedure: POLYPECTOMY;  Surgeon: Thornton Park, MD;  Location: [place];  Service: Gastroenterology;;     reports that he has been smoking cigarettes. He has been smoking about 1.00 pack per day. He has never used smokeless tobacco. He reports current alcohol use. He reports previous drug use. Frequency: 3.00 times per week. Drug: Cocaine.  Allergies  Allergen Reactions   Tomato Shortness Of Breath and Nausea And Vomiting   Aspirin Nausea And Vomiting    Family History  Problem Relation Age of Onset   Diabetes Mother    Alcoholism Mother    Emphysema Father    Lung cancer Father    Alcoholism Father     Prior to Admission  medications   Medication Sig Start Date End Date Taking? Authorizing Provider  ondansetron (ZOFRAN) 4 MG tablet Take 1 tablet (4 mg total) by mouth every 6 (six) hours. Patient not taking: Reported on 11/19/2018 11/04/18   McDonald, Mia A, PA-C  ferrous sulfate 325 (65 FE) MG tablet Take 1 tablet (325 mg total) by mouth daily with breakfast. Patient not taking: Reported on 09/23/2018 08/09/18  10/09/18  Elodia Florence., MD  pantoprazole (PROTONIX) 40 MG tablet Take 1 tablet (40 mg total) by mouth 2 (two) times daily. Patient not taking: Reported on 09/23/2018 08/09/18 10/09/18  Elodia Florence., MD    Physical Exam: Constitutional: Moderately built and nourished. Vitals:   12/13/18 0143 12/13/18 0200 12/13/18 0230 12/13/18 0300  BP: 126/77 113/72 117/74 119/78  Pulse: 88 84 83 92  Resp: 17 16 18    Temp:      TempSrc:      SpO2: 97% 95% 93% 98%  Weight:      Height:       Eyes: Nonicteric no pallor. ENMT: No discharge from the ears eyes nose or mouth. Neck: No mass felt.  No neck rigidity. Respiratory: No rhonchi or crepitations. Cardiovascular: S1-S2 heard. Abdomen: Soft mild epigastric tenderness no guarding or rigidity. Musculoskeletal: No edema. Skin: No rash. Neurologic: Alert awake oriented to time place and person.  Moves all extremities. Psychiatric: Denies any suicidal ideation.   Labs on Admission: I have personally reviewed following labs and imaging studies  CBC: Recent Labs  Lab 12/12/18 2251  WBC 4.2  NEUTROABS 2.2  HGB 10.0*  HCT 30.9*  MCV 98.7  PLT 67*   Basic Metabolic Panel: Recent Labs  Lab 12/12/18 2251  NA 136  K 3.3*  CL 100  CO2 26  GLUCOSE 100*  BUN 5*  CREATININE 0.44*  CALCIUM 8.2*   GFR: Estimated Creatinine Clearance: 108.4 mL/min (A) (by C-G formula based on SCr of 0.44 mg/dL (L)). Liver Function Tests: Recent Labs  Lab 12/12/18 2251  AST 116*  ALT 26  ALKPHOS 244*  BILITOT 1.7*  PROT 7.7  ALBUMIN 3.2*   Recent Labs  Lab 12/12/18 2251  LIPASE 58*   No results for input(s): AMMONIA in the last 168 hours. Coagulation Profile: No results for input(s): INR, PROTIME in the last 168 hours. Cardiac Enzymes: No results for input(s): CKTOTAL, CKMB, CKMBINDEX, TROPONINI in the last 168 hours. BNP (last 3 results) No results for input(s): PROBNP in the last 8760 hours. HbA1C: No results for  input(s): HGBA1C in the last 72 hours. CBG: No results for input(s): GLUCAP in the last 168 hours. Lipid Profile: No results for input(s): CHOL, HDL, LDLCALC, TRIG, CHOLHDL, LDLDIRECT in the last 72 hours. Thyroid Function Tests: No results for input(s): TSH, T4TOTAL, FREET4, T3FREE, THYROIDAB in the last 72 hours. Anemia Panel: No results for input(s): VITAMINB12, FOLATE, FERRITIN, TIBC, IRON, RETICCTPCT in the last 72 hours. Urine analysis:    Component Value Date/Time   COLORURINE YELLOW 12/12/2018 2355   APPEARANCEUR CLEAR 12/12/2018 2355   LABSPEC 1.008 12/12/2018 2355   PHURINE 6.0 12/12/2018 2355   GLUCOSEU NEGATIVE 12/12/2018 2355   HGBUR NEGATIVE 12/12/2018 2355   BILIRUBINUR NEGATIVE 12/12/2018 2355   KETONESUR NEGATIVE 12/12/2018 2355   PROTEINUR NEGATIVE 12/12/2018 2355   NITRITE NEGATIVE 12/12/2018 2355   LEUKOCYTESUR NEGATIVE 12/12/2018 2355   Sepsis Labs: @LABRCNTIP (procalcitonin:4,lacticidven:4) )No results found for this or any previous visit (from the past 240 hour(s)).   Radiological Exams  on Admission: Dg Chest 2 View  Result Date: 12/12/2018 CLINICAL DATA:  Mid chest and left chest pain. Shortness of breath, cough EXAM: CHEST - 2 VIEW COMPARISON:  10/08/2018 FINDINGS: Heart is normal size. Linear densities in the lung bases, likely atelectasis. No effusions. No acute bony abnormality. IMPRESSION: Bibasilar atelectasis. Electronically Signed   By: Rolm Baptise M.D.   On: 12/12/2018 23:26   Ct Head Wo Contrast  Result Date: 12/13/2018 CLINICAL DATA:  53 year old male with posttraumatic headache and seizures. EXAM: CT HEAD WITHOUT CONTRAST CT CERVICAL SPINE WITHOUT CONTRAST TECHNIQUE: Multidetector CT imaging of the head and cervical spine was performed following the standard protocol without intravenous contrast. Multiplanar CT image reconstructions of the cervical spine were also generated. COMPARISON:  Head CT dated 10/08/2018 FINDINGS: CT HEAD FINDINGS Brain:  Mild age-related atrophy and chronic microvascular ischemic changes. There is no acute intracranial hemorrhage. No mass effect or midline shift. No extra-axial fluid collection. Vascular: No hyperdense vessel or unexpected calcification. Skull: Normal. Negative for fracture or focal lesion. Sinuses/Orbits: No acute finding. Other: None CT CERVICAL SPINE FINDINGS Alignment: No acute subluxation. Skull base and vertebrae: No acute fracture. Soft tissues and spinal canal: No prevertebral fluid or swelling. No visible canal hematoma. Disc levels:  No acute findings. Mild degenerative changes. Upper chest: Negative. Other: Mild bilateral carotid bulb calcified plaques. IMPRESSION: 1. No acute intracranial hemorrhage. 2. No acute/traumatic cervical spine pathology. Electronically Signed   By: Anner Crete M.D.   On: 12/13/2018 01:52   Ct Cervical Spine Wo Contrast  Result Date: 12/13/2018 CLINICAL DATA:  53 year old male with posttraumatic headache and seizures. EXAM: CT HEAD WITHOUT CONTRAST CT CERVICAL SPINE WITHOUT CONTRAST TECHNIQUE: Multidetector CT imaging of the head and cervical spine was performed following the standard protocol without intravenous contrast. Multiplanar CT image reconstructions of the cervical spine were also generated. COMPARISON:  Head CT dated 10/08/2018 FINDINGS: CT HEAD FINDINGS Brain: Mild age-related atrophy and chronic microvascular ischemic changes. There is no acute intracranial hemorrhage. No mass effect or midline shift. No extra-axial fluid collection. Vascular: No hyperdense vessel or unexpected calcification. Skull: Normal. Negative for fracture or focal lesion. Sinuses/Orbits: No acute finding. Other: None CT CERVICAL SPINE FINDINGS Alignment: No acute subluxation. Skull base and vertebrae: No acute fracture. Soft tissues and spinal canal: No prevertebral fluid or swelling. No visible canal hematoma. Disc levels:  No acute findings. Mild degenerative changes. Upper chest:  Negative. Other: Mild bilateral carotid bulb calcified plaques. IMPRESSION: 1. No acute intracranial hemorrhage. 2. No acute/traumatic cervical spine pathology. Electronically Signed   By: Anner Crete M.D.   On: 12/13/2018 01:52   Ct Abdomen Pelvis W Contrast  Result Date: 12/13/2018 CLINICAL DATA:  Generalized abdominal pain and vomiting. Seizure earlier today with left flank bruising. Intractable hiccups. History of pancreatitis with elevated lipase. EXAM: CT ABDOMEN AND PELVIS WITH CONTRAST TECHNIQUE: Multidetector CT imaging of the abdomen and pelvis was performed using the standard protocol following bolus administration of intravenous contrast. CONTRAST:  165mL OMNIPAQUE IOHEXOL 300 MG/ML  SOLN COMPARISON:  Most recent CT 11/03/2018 FINDINGS: Lower chest: Subpleural atelectasis in the bases, right greater than left. No pleural fluid. Hepatobiliary: Hepatic steatosis. Slight nodular hepatic contour suggesting cirrhosis. Similar appearance of gallbladder wall thickening versus pericholecystic fluid prior exam. No calcified gallstone. Recannulated umbilical vein. Pancreas: Peripancreatic fat stranding about the head and uncinate process, slightly increased from prior exam. No pancreatic ductal dilatation. No peripancreatic fluid collection. No evidence pancreatic necrosis. Spleen: Normal in size without  focal abnormality. Adrenals/Urinary Tract: Normal adrenal glands. No hydronephrosis or perinephric edema. Homogeneous renal enhancement with symmetric excretion on delayed phase imaging. Urinary bladder is partially distended. There is urinary bladder wall thickening, similar to prior exam. Stomach/Bowel: Distal esophageal wall thickening. Nondistended stomach limiting assessment. No small bowel obstruction or inflammation. Appendix not visualized. Moderate stool throughout the colon without colonic wall thickening. Small volume of extraluminal mesenteric air adjacent to the transverse colon, series 2,  image 93, without associated colonic wall thickening. No adjacent inflammatory change. Vascular/Lymphatic: Aortic atherosclerosis. No aneurysm. Portal and splenic veins are patent. Recannulization of the umbilical vein. Prominent lymph node adjacent to the pancreatic head measuring 9 mm, series 2, image 31, similar to prior exam. Reproductive: Prostate is unremarkable. Other: Small focal subcutaneous contusion involving the left lateral flank. No significant ascites. Small focus of extraluminal mesenteric air in the right mid abdomen, series 2, image 41. small fat containing hernia containing recannulated umbilical vein and venous collaterals. Musculoskeletal: There are no acute or suspicious osseous abnormalities. Critical Value/emergent results were called by telephone at the time of interpretation on 12/13/2018 at 2:14 am to PA [place] , who verbally acknowledged these results. IMPRESSION: 1. Peripancreatic fat stranding about the head and uncinate process suspicious for acute pancreatitis, slightly progressed from exam last month. No peripancreatic fluid collection or pancreatic necrosis. 2. Small volume of extraluminal mesenteric air adjacent to the transverse colon, without associated colonic wall thickening or adjacent inflammation. Underlying etiology and significance is uncertain. 3. Hepatic steatosis. Slight nodular hepatic contour suggesting cirrhosis. Recannulated umbilical vein consistent with portal hypertension. 4. Similar appearance of gallbladder to prior with wall thickening versus pericholecystic fluid. 5. Chronic urinary bladder wall thickening. 6. Small focal subcutaneous contusion involving the left lateral flank. 7. Small fat containing hernia. 8. Distal esophageal wall thickening. Aortic Atherosclerosis (ICD10-I70.0). Electronically Signed   By: Keith Rake M.D.   On: 12/13/2018 02:14    EKG: Independently reviewed.  Poor baseline rhythm.  Show sinus rhythm on the  monitor.  Assessment/Plan Principal Problem:   Abdominal pain Active Problems:   Seizure ([place])   Alcohol abuse   Acute pancreatitis   Free intraperitoneal air    1. Abdominal pain likely from acute pancreatitis with concern for extraluminal air in the around the transverse colon -discussed with general surgery.  Will be seeing patient in consult we will keep patient n.p.o. for now IV fluids pain relief medication empiric antibiotics.  Follow LFTs and lipase.  IV fluids. 2. Alcohol abuse with possible alcohol withdrawal seizure on CIWA protocol. 3. Chronic anemia with history of GI bleed with last EGD and colonoscopy done in May 2020 which showed portal gastropathy and peptic ulcer disease -we will keep patient on Protonix.  Follow CBC. 4. Chronic thrombocytopenia likely from alcoholism -follow CBC. 5. Mild hypokalemia replace recheck.  Given that patient has acute pancreatitis possible extraluminal air and alcohol abuse with potential for withdrawal will need more than 2 midnight stay and inpatient status.   DVT prophylaxis: SCDs due to possibility of requirement for procedure and also due to severe thrombocytopenia. Code Status: Full code. Family Communication: Discussed with patient. Disposition Plan: To be determined. Consults called: General surgery. Admission status: Inpatient.   Rise Patience MD [facility] Pager [phone].  If 7PM-7AM, please contact night-coverage [email or web address] Password [facility]  12/13/2018, 3:04 AM

## 2018-12-13 NOTE — Plan of Care (Signed)
Patient remains NPO, medicated for abdominal pain x 2 earlier in the shift but then stated pain was improved.  Receiving Ativan per CIWA protocol.  Able to walk to the bathroom with standby assist.  No seizure activity this shift.

## 2018-12-13 NOTE — Progress Notes (Signed)
Pharmacy Antibiotic Note  Barry Moore is a 53 y.o. male admitted on 12/12/2018 with Intra-abdominal infection.  Pharmacy has been consulted for zosyn dosing.  Plan: Zosyn 3.375g IV q8h (4 hour infusion).  Height: 5\' 10"  (177.8 cm) Weight: 160 lb (72.6 kg) IBW/kg (Calculated) : 73  Temp (24hrs), Avg:99.1 F (37.3 C), Min:99.1 F (37.3 C), Max:99.1 F (37.3 C)  Recent Labs  Lab 12/12/18 2251  WBC 4.2  CREATININE 0.44*    Estimated Creatinine Clearance: 108.4 mL/min (A) (by C-G formula based on SCr of 0.44 mg/dL (L)).    Allergies  Allergen Reactions  . Tomato Shortness Of Breath and Nausea And Vomiting  . Aspirin Nausea And Vomiting    Antimicrobials this admission: Zosyn 12/13/2018 >>   Dose adjustments this admission: -  Microbiology results: -  Thank you for allowing pharmacy to be a part of this patient's care.  Nani Skillern [place] 12/13/2018 2:57 AM

## 2018-12-13 NOTE — ED Notes (Signed)
Patient transported to CT via stretcher.

## 2018-12-13 NOTE — ED Notes (Signed)
Report given to Felicia, RN

## 2018-12-13 NOTE — ED Notes (Signed)
ED TO INPATIENT HANDOFF REPORT  ED Nurse Name and Phone #: [place] G, [phone]  S Name/Age/Gender [name] 53 y.o. male Room/Bed: WA16/WA16  Code Status   Code Status: Full Code  Home/SNF/Other Home Patient oriented to: self, place, time and situation Is this baseline? Yes   Triage Complete: Triage complete  Chief Complaint Seizure  Triage Note Pt came by EMS after having multiple seizures. AOx4. Pt has hx of hep C, seizures, schizophrenia, and ETOH abuse. Pt is noncompliant with seizure meds. Pt c/o L side pain (from a fall, not related to seizure, yesterday). EMS reports tenderness when palpating L rib cage. Pt has 18LAC. Pt states he has had "some alcohol" tonight.    Allergies Allergies  Allergen Reactions  . Tomato Shortness Of Breath and Nausea And Vomiting  . Aspirin Nausea And Vomiting    Level of Care/Admitting Diagnosis ED Disposition    ED Disposition Condition Comment   Admit  Hospital Area: [facility] [id number]  Level of Care: Telemetry [5]  Admit to tele based on following criteria: Monitor for Ischemic changes  Covid Evaluation: Asymptomatic Screening Protocol (No Symptoms)  Diagnosis: Abdominal pain [id number]  Admitting Physician: Rise Patience [phone]  Attending Physician: Rise Patience [phone]  Estimated length of stay: past midnight tomorrow  Certification:: I certify this patient will need inpatient services for at least 2 midnights  PT Class (Do Not Modify): Inpatient [101]  PT Acc Code (Do Not Modify): Private [1]       B Medical/Surgery History Past Medical History:  Diagnosis Date  . Alcoholic hepatitis   . Depression   . ETOH abuse   . Gastric bleed 08/2018  . Hypertension   . Pancreatitis   . Seizures ([place])   . Suicidal behavior    Past Surgical History:  Procedure Laterality Date  . BIOPSY  08/05/2018   Procedure: BIOPSY;  Surgeon: Irving Copas., MD;  Location: [place];   Service: Gastroenterology;;  . BIOPSY  08/07/2018   Procedure: BIOPSY;  Surgeon: Thornton Park, MD;  Location: [place];  Service: Gastroenterology;;  . COLONOSCOPY WITH PROPOFOL N/A 08/07/2018   Procedure: COLONOSCOPY WITH PROPOFOL;  Surgeon: Thornton Park, MD;  Location: [place];  Service: Gastroenterology;  Laterality: N/A;  . ESOPHAGOGASTRODUODENOSCOPY (EGD) WITH PROPOFOL N/A 11/02/2016   Procedure: ESOPHAGOGASTRODUODENOSCOPY (EGD) WITH PROPOFOL;  Surgeon: Carol Ada, MD;  Location: [facility];  Service: Endoscopy;  Laterality: N/A;  . ESOPHAGOGASTRODUODENOSCOPY (EGD) WITH PROPOFOL N/A 08/05/2018   Procedure: ESOPHAGOGASTRODUODENOSCOPY (EGD) WITH PROPOFOL;  Surgeon: Rush Landmark Telford Nab., MD;  Location: [place];  Service: Gastroenterology;  Laterality: N/A;  . HERNIA REPAIR    . LEG SURGERY    . POLYPECTOMY  08/07/2018   Procedure: POLYPECTOMY;  Surgeon: Thornton Park, MD;  Location: [place];  Service: Gastroenterology;;     A IV Location/Drains/Wounds Patient Lines/Drains/Airways Status   Active Line/Drains/Airways    Name:   Placement date:   Placement time:   Site:   Days:   Peripheral IV 12/12/18 Left Antecubital   12/12/18    -    Antecubital   1          Intake/Output Last 24 hours  Intake/Output Summary (Last 24 hours) at 12/13/2018 0353 Last data filed at 12/13/2018 0325 Gross per 24 hour  Intake 50 ml  Output -  Net 50 ml    Labs/Imaging Results for orders placed or performed during the hospital encounter of 12/12/18 (from the past 48 hour(s))  CBC with Differential     Status: Abnormal   Collection Time: 12/12/18 10:51 PM  Result Value Ref Range   WBC 4.2 4.0 - 10.5 K/uL   RBC 3.13 (L) 4.22 - 5.81 MIL/uL   Hemoglobin 10.0 (L) 13.0 - 17.0 g/dL   HCT 30.9 (L) 39.0 - 52.0 %   MCV 98.7 80.0 - 100.0 fL   MCH 31.9 26.0 - 34.0 pg   MCHC 32.4 30.0 - 36.0 g/dL   RDW 15.4 11.5 - 15.5 %   Platelets 67 (L) 150 - 400 K/uL    Comment:  REPEATED TO VERIFY Immature Platelet Fraction may be clinically indicated, consider ordering this additional test [id number] CONSISTENT WITH PREVIOUS RESULT    nRBC 0.0 0.0 - 0.2 %   Neutrophils Relative % 53 %   Neutro Abs 2.2 1.7 - 7.7 K/uL   Lymphocytes Relative 26 %   Lymphs Abs 1.1 0.7 - 4.0 K/uL   Monocytes Relative 18 %   Monocytes Absolute 0.8 0.1 - 1.0 K/uL   Eosinophils Relative 1 %   Eosinophils Absolute 0.1 0.0 - 0.5 K/uL   Basophils Relative 1 %   Basophils Absolute 0.1 0.0 - 0.1 K/uL   Immature Granulocytes 1 %   Abs Immature Granulocytes 0.03 0.00 - 0.07 K/uL    Comment: Performed at [facility], [place] [address] [place], [place] [address]  Comprehensive metabolic panel     Status: Abnormal   Collection Time: 12/12/18 10:51 PM  Result Value Ref Range   Sodium 136 135 - 145 mmol/L   Potassium 3.3 (L) 3.5 - 5.1 mmol/L   Chloride 100 98 - 111 mmol/L   CO2 26 22 - 32 mmol/L   Glucose, Bld 100 (H) 70 - 99 mg/dL   BUN 5 (L) 6 - 20 mg/dL   Creatinine, Ser 0.44 (L) 0.61 - 1.24 mg/dL   Calcium 8.2 (L) 8.9 - 10.3 mg/dL   Total Protein 7.7 6.5 - 8.1 g/dL   Albumin 3.2 (L) 3.5 - 5.0 g/dL   AST 116 (H) 15 - 41 U/L   ALT 26 0 - 44 U/L   Alkaline Phosphatase 244 (H) 38 - 126 U/L   Total Bilirubin 1.7 (H) 0.3 - 1.2 mg/dL   GFR calc non Af Amer >60 >60 mL/min   GFR calc Af Amer >60 >60 mL/min   Anion gap 10 5 - 15    Comment: Performed at [facility], [place] [address] [place], [place] [address]  Lipase, blood     Status: Abnormal   Collection Time: 12/12/18 10:51 PM  Result Value Ref Range   Lipase 58 (H) 11 - 51 U/L    Comment: Performed at [facility], [place] [address] [place], [place] [address]  Ethanol     Status: Abnormal   Collection Time: 12/12/18 10:54 PM  Result Value Ref Range   Alcohol, Ethyl (B) 323 (HH) <10 mg/dL    Comment: CRITICAL RESULT CALLED TO, READ BACK BY AND VERIFIED WITH: BROOKS,B @ 0028  ON [id number] BY POTEAT,S (NOTE) Lowest detectable limit for serum alcohol is 10 mg/dL. For medical purposes only. Performed at [facility], [place] [address], [place], [place] [id number]   Salicylate level     Status: None   Collection Time: 12/12/18 10:54 PM  Result Value Ref Range   Salicylate Lvl [id number] 2.8 - 30.0 mg/dL    Comment: Performed at [facility], [place]  [address], [place], [place] [address]  Acetaminophen level     Status: Abnormal   Collection Time: 12/12/18 10:54 PM  Result Value Ref Range   Acetaminophen (Tylenol), Serum <10 (L) 10 - 30 ug/mL    Comment: (NOTE) Therapeutic concentrations vary significantly. A range of 10-30 ug/mL  may be an effective concentration for many patients. However, some  are best treated at concentrations outside of this range. Acetaminophen concentrations >150 ug/mL at 4 hours after ingestion  and >50 ug/mL at 12 hours after ingestion are often associated with  toxic reactions. Performed at [facility], [place] [address] [place], [place] [address]   Urinalysis, Routine w reflex microscopic     Status: None   Collection Time: 12/12/18 11:55 PM  Result Value Ref Range   Color, Urine YELLOW YELLOW   APPearance CLEAR CLEAR   Specific Gravity, Urine 1.008 1.005 - 1.030   pH 6.0 5.0 - 8.0   Glucose, UA NEGATIVE NEGATIVE mg/dL   Hgb urine dipstick NEGATIVE NEGATIVE   Bilirubin Urine NEGATIVE NEGATIVE   Ketones, ur NEGATIVE NEGATIVE mg/dL   Protein, ur NEGATIVE NEGATIVE mg/dL   Nitrite NEGATIVE NEGATIVE   Leukocytes,Ua NEGATIVE NEGATIVE    Comment: Performed at [place] [address], [place], [place] [address]   Dg Chest 2 View  Result Date: 12/12/2018 CLINICAL DATA:  Mid chest and left chest pain. Shortness of breath, cough EXAM: CHEST - 2 VIEW COMPARISON:  10/08/2018 FINDINGS: Heart is normal size. Linear densities in the lung bases, likely atelectasis. No  effusions. No acute bony abnormality. IMPRESSION: Bibasilar atelectasis. Electronically Signed   By: Rolm Baptise M.D.   On: 12/12/2018 23:26   Ct Head Wo Contrast  Result Date: 12/13/2018 CLINICAL DATA:  53 year old male with posttraumatic headache and seizures. EXAM: CT HEAD WITHOUT CONTRAST CT CERVICAL SPINE WITHOUT CONTRAST TECHNIQUE: Multidetector CT imaging of the head and cervical spine was performed following the standard protocol without intravenous contrast. Multiplanar CT image reconstructions of the cervical spine were also generated. COMPARISON:  Head CT dated 10/08/2018 FINDINGS: CT HEAD FINDINGS Brain: Mild age-related atrophy and chronic microvascular ischemic changes. There is no acute intracranial hemorrhage. No mass effect or midline shift. No extra-axial fluid collection. Vascular: No hyperdense vessel or unexpected calcification. Skull: Normal. Negative for fracture or focal lesion. Sinuses/Orbits: No acute finding. Other: None CT CERVICAL SPINE FINDINGS Alignment: No acute subluxation. Skull base and vertebrae: No acute fracture. Soft tissues and spinal canal: No prevertebral fluid or swelling. No visible canal hematoma. Disc levels:  No acute findings. Mild degenerative changes. Upper chest: Negative. Other: Mild bilateral carotid bulb calcified plaques. IMPRESSION: 1. No acute intracranial hemorrhage. 2. No acute/traumatic cervical spine pathology. Electronically Signed   By: Anner Crete M.D.   On: 12/13/2018 01:52   Ct Cervical Spine Wo Contrast  Result Date: 12/13/2018 CLINICAL DATA:  53 year old male with posttraumatic headache and seizures. EXAM: CT HEAD WITHOUT CONTRAST CT CERVICAL SPINE WITHOUT CONTRAST TECHNIQUE: Multidetector CT imaging of the head and cervical spine was performed following the standard protocol without intravenous contrast. Multiplanar CT image reconstructions of the cervical spine were also generated. COMPARISON:  Head CT dated 10/08/2018 FINDINGS: CT  HEAD FINDINGS Brain: Mild age-related atrophy and chronic microvascular ischemic changes. There is no acute intracranial hemorrhage. No mass effect or midline shift. No extra-axial fluid collection. Vascular: No hyperdense vessel or unexpected calcification. Skull: Normal. Negative for fracture or focal lesion. Sinuses/Orbits: No acute finding. Other: None CT  CERVICAL SPINE FINDINGS Alignment: No acute subluxation. Skull base and vertebrae: No acute fracture. Soft tissues and spinal canal: No prevertebral fluid or swelling. No visible canal hematoma. Disc levels:  No acute findings. Mild degenerative changes. Upper chest: Negative. Other: Mild bilateral carotid bulb calcified plaques. IMPRESSION: 1. No acute intracranial hemorrhage. 2. No acute/traumatic cervical spine pathology. Electronically Signed   By: Anner Crete M.D.   On: 12/13/2018 01:52   Ct Abdomen Pelvis W Contrast  Result Date: 12/13/2018 CLINICAL DATA:  Generalized abdominal pain and vomiting. Seizure earlier today with left flank bruising. Intractable hiccups. History of pancreatitis with elevated lipase. EXAM: CT ABDOMEN AND PELVIS WITH CONTRAST TECHNIQUE: Multidetector CT imaging of the abdomen and pelvis was performed using the standard protocol following bolus administration of intravenous contrast. CONTRAST:  174mL OMNIPAQUE IOHEXOL 300 MG/ML  SOLN COMPARISON:  Most recent CT 11/03/2018 FINDINGS: Lower chest: Subpleural atelectasis in the bases, right greater than left. No pleural fluid. Hepatobiliary: Hepatic steatosis. Slight nodular hepatic contour suggesting cirrhosis. Similar appearance of gallbladder wall thickening versus pericholecystic fluid prior exam. No calcified gallstone. Recannulated umbilical vein. Pancreas: Peripancreatic fat stranding about the head and uncinate process, slightly increased from prior exam. No pancreatic ductal dilatation. No peripancreatic fluid collection. No evidence pancreatic necrosis. Spleen:  Normal in size without focal abnormality. Adrenals/Urinary Tract: Normal adrenal glands. No hydronephrosis or perinephric edema. Homogeneous renal enhancement with symmetric excretion on delayed phase imaging. Urinary bladder is partially distended. There is urinary bladder wall thickening, similar to prior exam. Stomach/Bowel: Distal esophageal wall thickening. Nondistended stomach limiting assessment. No small bowel obstruction or inflammation. Appendix not visualized. Moderate stool throughout the colon without colonic wall thickening. Small volume of extraluminal mesenteric air adjacent to the transverse colon, series 2, image 93, without associated colonic wall thickening. No adjacent inflammatory change. Vascular/Lymphatic: Aortic atherosclerosis. No aneurysm. Portal and splenic veins are patent. Recannulization of the umbilical vein. Prominent lymph node adjacent to the pancreatic head measuring 9 mm, series 2, image 31, similar to prior exam. Reproductive: Prostate is unremarkable. Other: Small focal subcutaneous contusion involving the left lateral flank. No significant ascites. Small focus of extraluminal mesenteric air in the right mid abdomen, series 2, image 41. small fat containing hernia containing recannulated umbilical vein and venous collaterals. Musculoskeletal: There are no acute or suspicious osseous abnormalities. Critical Value/emergent results were called by telephone at the time of interpretation on 12/13/2018 at 2:14 am to PA Morton , who verbally acknowledged these results. IMPRESSION: 1. Peripancreatic fat stranding about the head and uncinate process suspicious for acute pancreatitis, slightly progressed from exam last month. No peripancreatic fluid collection or pancreatic necrosis. 2. Small volume of extraluminal mesenteric air adjacent to the transverse colon, without associated colonic wall thickening or adjacent inflammation. Underlying etiology and significance is uncertain.  3. Hepatic steatosis. Slight nodular hepatic contour suggesting cirrhosis. Recannulated umbilical vein consistent with portal hypertension. 4. Similar appearance of gallbladder to prior with wall thickening versus pericholecystic fluid. 5. Chronic urinary bladder wall thickening. 6. Small focal subcutaneous contusion involving the left lateral flank. 7. Small fat containing hernia. 8. Distal esophageal wall thickening. Aortic Atherosclerosis (ICD10-I70.0). Electronically Signed   By: Keith Rake M.D.   On: 12/13/2018 02:14    Pending Labs Unresulted Labs (From admission, onward)    Start     Ordered   12/13/18 [id number]  Basic metabolic panel  Tomorrow morning,   R     12/13/18 0303   12/13/18 0500  CBC  Tomorrow  morning,   R     12/13/18 0303   12/13/18 0259  Magnesium  Once,   STAT     12/13/18 0303   12/13/18 0259  Phosphorus  Once,   STAT     12/13/18 0303   12/13/18 0244  SARS CORONAVIRUS 2 (TAT 6-24 HRS) Nasopharyngeal Nasopharyngeal Swab  (Asymptomatic/Tier 2 Patients Labs)  Once,   STAT    Question Answer Comment  Is this test for diagnosis or screening Screening   Symptomatic for COVID-19 as defined by CDC No   Hospitalized for COVID-19 No   Admitted to ICU for COVID-19 No   Previously tested for COVID-19 Yes   Resident in a congregate (group) care setting Yes   Employed in healthcare setting No      12/13/18 0243          Vitals/Pain Today's Vitals   12/13/18 0230 12/13/18 0300 12/13/18 0324 12/13/18 0330  BP: 117/74 119/78  115/74  Pulse: 83 92  75  Resp: 18 15  15   Temp:      TempSrc:      SpO2: 93% 98%  96%  Weight:      Height:      PainSc:   Asleep     Isolation Precautions No active isolations  Medications Medications  sodium chloride (PF) 0.9 % injection (has no administration in time range)  lactated ringers infusion (has no administration in time range)  piperacillin-tazobactam (ZOSYN) IVPB 3.375 g (has no administration in time range)  LORazepam  (ATIVAN) tablet 1-4 mg (has no administration in time range)    Or  LORazepam (ATIVAN) injection 1-4 mg (has no administration in time range)  thiamine (VITAMIN B-1) tablet 100 mg (has no administration in time range)    Or  thiamine (B-1) injection 100 mg (has no administration in time range)  folic acid (FOLVITE) tablet 1 mg (has no administration in time range)  multivitamin with minerals tablet 1 tablet (has no administration in time range)  acetaminophen (TYLENOL) tablet 650 mg (has no administration in time range)    Or  acetaminophen (TYLENOL) suppository 650 mg (has no administration in time range)  ondansetron (ZOFRAN) tablet 4 mg (has no administration in time range)    Or  ondansetron (ZOFRAN) injection 4 mg (has no administration in time range)  LORazepam (ATIVAN) injection 0-4 mg (has no administration in time range)    Followed by  LORazepam (ATIVAN) injection 0-4 mg (has no administration in time range)  fentaNYL (SUBLIMAZE) injection 25 mcg (has no administration in time range)  metoCLOPramide (REGLAN) injection 10 mg (10 mg Intravenous Given 12/12/18 2342)  iohexol (OMNIPAQUE) 300 MG/ML solution 100 mL (100 mLs Intravenous Contrast Given 12/13/18 0128)  fentaNYL (SUBLIMAZE) injection 75 mcg (75 mcg Intravenous Given 12/13/18 0300)  lactated ringers bolus 1,000 mL (1,000 mLs Intravenous Bolus from Bag 12/13/18 0326)  piperacillin-tazobactam (ZOSYN) IVPB 3.375 g (0 g Intravenous Stopped 12/13/18 0325)    Mobility walks Moderate fall risk

## 2018-12-13 NOTE — Consult Note (Signed)
Reason for Consult: abnormal imaging, abd pain Referring Physician: Dr Barry Moore is an 53 y.o. male.  HPI: Pt with h/o alcohol abuse and GI bleed presents to the ER with complaints of having at least 2 back-to-back seizures.  Also has been having recurrent falls recently.  Over the last 2 days he has been having abdominal pain across his lower abdomen diffuse nonradiating with some nausea and vomiting He denies any diarrhea.  Admits to drinking alcohol every day.  Past Medical History:  Diagnosis Date  . Alcoholic hepatitis   . Depression   . ETOH abuse   . Gastric bleed 08/2018  . Hypertension   . Pancreatitis   . Seizures ([place])   . Suicidal behavior     Past Surgical History:  Procedure Laterality Date  . BIOPSY  08/05/2018   Procedure: BIOPSY;  Surgeon: Barry Moore., MD;  Location: [place];  Service: Gastroenterology;;  . BIOPSY  08/07/2018   Procedure: BIOPSY;  Surgeon: Barry Park, MD;  Location: [place];  Service: Gastroenterology;;  . COLONOSCOPY WITH PROPOFOL N/A 08/07/2018   Procedure: COLONOSCOPY WITH PROPOFOL;  Surgeon: Barry Park, MD;  Location: [place];  Service: Gastroenterology;  Laterality: N/A;  . ESOPHAGOGASTRODUODENOSCOPY (EGD) WITH PROPOFOL N/A 11/02/2016   Procedure: ESOPHAGOGASTRODUODENOSCOPY (EGD) WITH PROPOFOL;  Surgeon: Barry Ada, MD;  Location: [facility];  Service: Endoscopy;  Laterality: N/A;  . ESOPHAGOGASTRODUODENOSCOPY (EGD) WITH PROPOFOL N/A 08/05/2018   Procedure: ESOPHAGOGASTRODUODENOSCOPY (EGD) WITH PROPOFOL;  Surgeon: Barry Moore., MD;  Location: [place];  Service: Gastroenterology;  Laterality: N/A;  . HERNIA REPAIR    . LEG SURGERY    . POLYPECTOMY  08/07/2018   Procedure: POLYPECTOMY;  Surgeon: Barry Park, MD;  Location: [facility] ENDOSCOPY;  Service: Gastroenterology;;    Family History  Problem Relation Age of Onset  . Diabetes Mother   . Alcoholism Mother   .  Emphysema Father   . Lung cancer Father   . Alcoholism Father     Social History:  reports that he has been smoking cigarettes. He has been smoking about 1.00 pack per day. He has never used smokeless tobacco. He reports current alcohol use. He reports previous drug use. Frequency: 3.00 times per week. Drug: Cocaine.  Allergies:  Allergies  Allergen Reactions  . Tomato Shortness Of Breath and Nausea And Vomiting  . Aspirin Nausea And Vomiting    Medications: I have reviewed the patient's current medications.  Results for orders placed or performed during the hospital encounter of 12/12/18 (from the past 48 hour(s))  CBC with Differential     Status: Abnormal   Collection Time: 12/12/18 10:51 PM  Result Value Ref Range   WBC 4.2 4.0 - 10.5 K/uL   RBC 3.13 (L) 4.22 - 5.81 MIL/uL   Hemoglobin 10.0 (L) 13.0 - 17.0 g/dL   HCT 30.9 (L) 39.0 - 52.0 %   MCV 98.7 80.0 - 100.0 fL   MCH 31.9 26.0 - 34.0 pg   MCHC 32.4 30.0 - 36.0 g/dL   RDW 15.4 11.5 - 15.5 %   Platelets 67 (L) 150 - 400 K/uL    Comment: REPEATED TO VERIFY Immature Platelet Fraction may be clinically indicated, consider ordering this additional test [id number] CONSISTENT WITH PREVIOUS RESULT    nRBC 0.0 0.0 - 0.2 %   Neutrophils Relative % 53 %   Neutro Abs 2.2 1.7 - 7.7 K/uL   Lymphocytes Relative 26 %   Lymphs Abs 1.1 0.7 - 4.0 K/uL  Monocytes Relative 18 %   Monocytes Absolute 0.8 0.1 - 1.0 K/uL   Eosinophils Relative 1 %   Eosinophils Absolute 0.1 0.0 - 0.5 K/uL   Basophils Relative 1 %   Basophils Absolute 0.1 0.0 - 0.1 K/uL   Immature Granulocytes 1 %   Abs Immature Granulocytes 0.03 0.00 - 0.07 K/uL    Comment: Performed at [facility], [place] [address], [place], [place] [address]  Comprehensive metabolic panel     Status: Abnormal   Collection Time: 12/12/18 10:51 PM  Result Value Ref Range   Sodium 136 135 - 145 mmol/L   Potassium 3.3 (L) 3.5 - 5.1 mmol/L   Chloride 100 98 - 111  mmol/L   CO2 26 22 - 32 mmol/L   Glucose, Bld 100 (H) 70 - 99 mg/dL   BUN 5 (L) 6 - 20 mg/dL   Creatinine, Ser 0.44 (L) 0.61 - 1.24 mg/dL   Calcium 8.2 (L) 8.9 - 10.3 mg/dL   Total Protein 7.7 6.5 - 8.1 g/dL   Albumin 3.2 (L) 3.5 - 5.0 g/dL   AST 116 (H) 15 - 41 U/L   ALT 26 0 - 44 U/L   Alkaline Phosphatase 244 (H) 38 - 126 U/L   Total Bilirubin 1.7 (H) 0.3 - 1.2 mg/dL   GFR calc non Af Amer >60 >60 mL/min   GFR calc Af Amer >60 >60 mL/min   Anion gap 10 5 - 15    Comment: Performed at [facility], [place] [address], [place], [place] [address]  Lipase, blood     Status: Abnormal   Collection Time: 12/12/18 10:51 PM  Result Value Ref Range   Lipase 58 (H) 11 - 51 U/L    Comment: Performed at [facility], [place] [address] [place], [place] [address]  Magnesium     Status: None   Collection Time: 12/12/18 10:51 PM  Result Value Ref Range   Magnesium 2.3 1.7 - 2.4 mg/dL    Comment: Performed at [facility], [place] [address], [place], [place] [address]  Phosphorus     Status: None   Collection Time: 12/12/18 10:51 PM  Result Value Ref Range   Phosphorus 3.4 2.5 - 4.6 mg/dL    Comment: Performed at [facility], [place] [address], [place], [place] [address]  Ethanol     Status: Abnormal   Collection Time: 12/12/18 10:54 PM  Result Value Ref Range   Alcohol, Ethyl (B) 323 (HH) <10 mg/dL    Comment: CRITICAL RESULT CALLED TO, READ BACK BY AND VERIFIED WITH: BROOKS,B @ 0028 ON [id number] BY POTEAT,S (NOTE) Lowest detectable limit for serum alcohol is 10 mg/dL. For medical purposes only. Performed at [facility], [place] [address] [place], [place] [id number]   Salicylate level     Status: None   Collection Time: 12/12/18 10:54 PM  Result Value Ref Range   Salicylate Lvl [id number] 2.8 - 30.0 mg/dL    Comment: Performed at [facility], [place] [address], [place], [place] [address]   Acetaminophen level     Status: Abnormal   Collection Time: 12/12/18 10:54 PM  Result Value Ref Range   Acetaminophen (Tylenol), Serum <10 (L) 10 - 30 ug/mL    Comment: (NOTE) Therapeutic concentrations vary significantly. A range of 10-30 ug/mL  may be an effective concentration for many patients. However, some  are best treated at concentrations outside of this range. Acetaminophen  concentrations >150 ug/mL at 4 hours after ingestion  and >50 ug/mL at 12 hours after ingestion are often associated with  toxic reactions. Performed at [facility], [place] [address], [place], [place] [address]   Urinalysis, Routine w reflex microscopic     Status: None   Collection Time: 12/12/18 11:55 PM  Result Value Ref Range   Color, Urine YELLOW YELLOW   APPearance CLEAR CLEAR   Specific Gravity, Urine 1.008 1.005 - 1.030   pH 6.0 5.0 - 8.0   Glucose, UA NEGATIVE NEGATIVE mg/dL   Hgb urine dipstick NEGATIVE NEGATIVE   Bilirubin Urine NEGATIVE NEGATIVE   Ketones, ur NEGATIVE NEGATIVE mg/dL   Protein, ur NEGATIVE NEGATIVE mg/dL   Nitrite NEGATIVE NEGATIVE   Leukocytes,Ua NEGATIVE NEGATIVE    Comment: Performed at [place] [address], [place], [place] [id number]  Basic metabolic panel     Status: Abnormal   Collection Time: 12/13/18  5:28 AM  Result Value Ref Range   Sodium 136 135 - 145 mmol/L   Potassium 3.2 (L) 3.5 - 5.1 mmol/L   Chloride 102 98 - 111 mmol/L   CO2 23 22 - 32 mmol/L   Glucose, Bld 89 70 - 99 mg/dL   BUN <5 (L) 6 - 20 mg/dL   Creatinine, Ser 0.51 (L) 0.61 - 1.24 mg/dL   Calcium 8.0 (L) 8.9 - 10.3 mg/dL   GFR calc non Af Amer >60 >60 mL/min   GFR calc Af Amer >60 >60 mL/min   Anion gap 11 5 - 15    Comment: Performed at [facility], [place] [address] [place], [place] [address]  CBC     Status: Abnormal   Collection Time: 12/13/18  5:28 AM  Result Value Ref Range   WBC 2.7 (L) 4.0 - 10.5 K/uL   RBC 2.95 (L)  4.22 - 5.81 MIL/uL   Hemoglobin 9.6 (L) 13.0 - 17.0 g/dL   HCT 29.3 (L) 39.0 - 52.0 %   MCV 99.3 80.0 - 100.0 fL   MCH 32.5 26.0 - 34.0 pg   MCHC 32.8 30.0 - 36.0 g/dL   RDW 15.1 11.5 - 15.5 %   Platelets 61 (L) 150 - 400 K/uL    Comment: Immature Platelet Fraction may be clinically indicated, consider ordering this additional test [id number] CONSISTENT WITH PREVIOUS RESULT    nRBC 0.0 0.0 - 0.2 %    Comment: Performed at [facility], [place] [address] [place], [place] [address]    Dg Chest 2 View  Result Date: 12/12/2018 CLINICAL DATA:  Mid chest and left chest pain. Shortness of breath, cough EXAM: CHEST - 2 VIEW COMPARISON:  10/08/2018 FINDINGS: Heart is normal size. Linear densities in the lung bases, likely atelectasis. No effusions. No acute bony abnormality. IMPRESSION: Bibasilar atelectasis. Electronically Signed   By: Rolm Baptise M.D.   On: 12/12/2018 23:26   Ct Head Wo Contrast  Result Date: 12/13/2018 CLINICAL DATA:  53 year old male with posttraumatic headache and seizures. EXAM: CT HEAD WITHOUT CONTRAST CT CERVICAL SPINE WITHOUT CONTRAST TECHNIQUE: Multidetector CT imaging of the head and cervical spine was performed following the standard protocol without intravenous contrast. Multiplanar CT image reconstructions of the cervical spine were also generated. COMPARISON:  Head CT dated 10/08/2018 FINDINGS: CT HEAD FINDINGS Brain: Mild age-related atrophy and chronic microvascular ischemic changes. There is no acute intracranial hemorrhage. No mass effect or midline shift. No extra-axial fluid collection. Vascular: No hyperdense vessel  or unexpected calcification. Skull: Normal. Negative for fracture or focal lesion. Sinuses/Orbits: No acute finding. Other: None CT CERVICAL SPINE FINDINGS Alignment: No acute subluxation. Skull base and vertebrae: No acute fracture. Soft tissues and spinal canal: No prevertebral fluid or swelling. No visible canal hematoma. Disc  levels:  No acute findings. Mild degenerative changes. Upper chest: Negative. Other: Mild bilateral carotid bulb calcified plaques. IMPRESSION: 1. No acute intracranial hemorrhage. 2. No acute/traumatic cervical spine pathology. Electronically Signed   By: Anner Crete M.D.   On: 12/13/2018 01:52   Ct Cervical Spine Wo Contrast  Result Date: 12/13/2018 CLINICAL DATA:  53 year old male with posttraumatic headache and seizures. EXAM: CT HEAD WITHOUT CONTRAST CT CERVICAL SPINE WITHOUT CONTRAST TECHNIQUE: Multidetector CT imaging of the head and cervical spine was performed following the standard protocol without intravenous contrast. Multiplanar CT image reconstructions of the cervical spine were also generated. COMPARISON:  Head CT dated 10/08/2018 FINDINGS: CT HEAD FINDINGS Brain: Mild age-related atrophy and chronic microvascular ischemic changes. There is no acute intracranial hemorrhage. No mass effect or midline shift. No extra-axial fluid collection. Vascular: No hyperdense vessel or unexpected calcification. Skull: Normal. Negative for fracture or focal lesion. Sinuses/Orbits: No acute finding. Other: None CT CERVICAL SPINE FINDINGS Alignment: No acute subluxation. Skull base and vertebrae: No acute fracture. Soft tissues and spinal canal: No prevertebral fluid or swelling. No visible canal hematoma. Disc levels:  No acute findings. Mild degenerative changes. Upper chest: Negative. Other: Mild bilateral carotid bulb calcified plaques. IMPRESSION: 1. No acute intracranial hemorrhage. 2. No acute/traumatic cervical spine pathology. Electronically Signed   By: Anner Crete M.D.   On: 12/13/2018 01:52   Ct Abdomen Pelvis W Contrast  Result Date: 12/13/2018 CLINICAL DATA:  Generalized abdominal pain and vomiting. Seizure earlier today with left flank bruising. Intractable hiccups. History of pancreatitis with elevated lipase. EXAM: CT ABDOMEN AND PELVIS WITH CONTRAST TECHNIQUE: Multidetector CT  imaging of the abdomen and pelvis was performed using the standard protocol following bolus administration of intravenous contrast. CONTRAST:  110mL OMNIPAQUE IOHEXOL 300 MG/ML  SOLN COMPARISON:  Most recent CT 11/03/2018 FINDINGS: Lower chest: Subpleural atelectasis in the bases, right greater than left. No pleural fluid. Hepatobiliary: Hepatic steatosis. Slight nodular hepatic contour suggesting cirrhosis. Similar appearance of gallbladder wall thickening versus pericholecystic fluid prior exam. No calcified gallstone. Recannulated umbilical vein. Pancreas: Peripancreatic fat stranding about the head and uncinate process, slightly increased from prior exam. No pancreatic ductal dilatation. No peripancreatic fluid collection. No evidence pancreatic necrosis. Spleen: Normal in size without focal abnormality. Adrenals/Urinary Tract: Normal adrenal glands. No hydronephrosis or perinephric edema. Homogeneous renal enhancement with symmetric excretion on delayed phase imaging. Urinary bladder is partially distended. There is urinary bladder wall thickening, similar to prior exam. Stomach/Bowel: Distal esophageal wall thickening. Nondistended stomach limiting assessment. No small bowel obstruction or inflammation. Appendix not visualized. Moderate stool throughout the colon without colonic wall thickening. Small volume of extraluminal mesenteric air adjacent to the transverse colon, series 2, image 93, without associated colonic wall thickening. No adjacent inflammatory change. Vascular/Lymphatic: Aortic atherosclerosis. No aneurysm. Portal and splenic veins are patent. Recannulization of the umbilical vein. Prominent lymph node adjacent to the pancreatic head measuring 9 mm, series 2, image 31, similar to prior exam. Reproductive: Prostate is unremarkable. Other: Small focal subcutaneous contusion involving the left lateral flank. No significant ascites. Small focus of extraluminal mesenteric air in the right mid  abdomen, series 2, image 41. small fat containing hernia containing recannulated umbilical vein and venous  collaterals. Musculoskeletal: There are no acute or suspicious osseous abnormalities. Critical Value/emergent results were called by telephone at the time of interpretation on 12/13/2018 at 2:14 am to PA Burns , who verbally acknowledged these results. IMPRESSION: 1. Peripancreatic fat stranding about the head and uncinate process suspicious for acute pancreatitis, slightly progressed from exam last month. No peripancreatic fluid collection or pancreatic necrosis. 2. Small volume of extraluminal mesenteric air adjacent to the transverse colon, without associated colonic wall thickening or adjacent inflammation. Underlying etiology and significance is uncertain. 3. Hepatic steatosis. Slight nodular hepatic contour suggesting cirrhosis. Recannulated umbilical vein consistent with portal hypertension. 4. Similar appearance of gallbladder to prior with wall thickening versus pericholecystic fluid. 5. Chronic urinary bladder wall thickening. 6. Small focal subcutaneous contusion involving the left lateral flank. 7. Small fat containing hernia. 8. Distal esophageal wall thickening. Aortic Atherosclerosis (ICD10-I70.0). Electronically Signed   By: Keith Rake M.D.   On: 12/13/2018 02:14    Review of Systems  Constitutional: Negative for chills, fever and weight loss.  HENT: Negative for hearing loss and tinnitus.   Eyes: Negative for blurred vision and double vision.  Respiratory: Negative for cough and shortness of breath.   Cardiovascular: Negative for chest pain and palpitations.  Gastrointestinal: Positive for abdominal pain, nausea and vomiting.  Genitourinary: Negative for dysuria, frequency and urgency.  Musculoskeletal: Negative for myalgias.  Skin: Negative for itching and rash.  Neurological: Positive for seizures. Negative for dizziness and headaches.   Blood pressure 134/82, pulse  85, temperature 98.5 F (36.9 C), temperature source Oral, resp. rate 18, height 5\' 10"  (1.778 m), weight 70.6 kg, SpO2 98 %. Physical Exam  Constitutional: He is oriented to person, place, and time. He appears well-developed and well-nourished. No distress.  HENT:  Head: Normocephalic and atraumatic.  Eyes: Pupils are equal, round, and reactive to light. Conjunctivae and EOM are normal.  Neck: Normal range of motion. Neck supple.  Cardiovascular: Normal rate and regular rhythm.  Respiratory: Effort normal. No respiratory distress.  GI: Soft. He exhibits no distension. There is abdominal tenderness (LLQ).  Musculoskeletal: Normal range of motion.  Neurological: He is alert and oriented to person, place, and time.  Skin: Skin is warm and dry.    Assessment/Plan: CT findings of small amount of pericolonic air are of unknown significance.  It doesn't appear that he has any stranding around his colon to suggest perforation.  Pt also most tender on exam in LLQ and not RUQ.  Once pancreatitis better, can try a diet.  If he develops no further RUQ pain with bowel function, I do not think it is reasonable to pursue any further treatment.  Will follow.  Pt recently had a neg colonoscopy.    Barry Moore [id number], 9:27 AM

## 2018-12-13 NOTE — Progress Notes (Signed)
PHARMACY NOTE -  [facility] has been assisting with dosing of Zosyn for intra-abdominal infection.  Dosage remains stable at 3.375gm IV q8hr and need for further dosage adjustment appears unlikely at present.    Will sign off at this time.  Please reconsult if a change in clinical status warrants re-evaluation of dosage.  Leone Haven, PharmD

## 2018-12-14 LAB — COMPREHENSIVE METABOLIC PANEL
ALT: 22 U/L (ref 0–44)
AST: 100 U/L — ABNORMAL HIGH (ref 15–41)
Albumin: 3 g/dL — ABNORMAL LOW (ref 3.5–5.0)
Alkaline Phosphatase: 217 U/L — ABNORMAL HIGH (ref 38–126)
Anion gap: 10 (ref 5–15)
BUN: 7 mg/dL (ref 6–20)
CO2: 24 mmol/L (ref 22–32)
Calcium: 8.5 mg/dL — ABNORMAL LOW (ref 8.9–10.3)
Chloride: 96 mmol/L — ABNORMAL LOW (ref 98–111)
Creatinine, Ser: 0.6 mg/dL — ABNORMAL LOW (ref 0.61–1.24)
GFR calc Af Amer: >60 mL/min (ref >60)
GFR calc non Af Amer: >60 mL/min (ref >60)
Glucose, Bld: 78 mg/dL (ref 70–99)
Potassium: 4.6 mmol/L (ref 3.5–5.1)
Sodium: 130 mmol/L — ABNORMAL LOW (ref 135–145)
Total Bilirubin: 3.3 mg/dL — ABNORMAL HIGH (ref 0.3–1.2)
Total Protein: 7.2 g/dL (ref 6.5–8.1)

## 2018-12-14 LAB — CBC
HCT: 31.9 % — ABNORMAL LOW (ref 39.0–52.0)
Hemoglobin: 10.5 g/dL — ABNORMAL LOW (ref 13.0–17.0)
MCH: 32.6 pg (ref 26.0–34.0)
MCHC: 32.9 g/dL (ref 30.0–36.0)
MCV: 99.1 fL (ref 80.0–100.0)
Platelets: 57 10*3/uL — ABNORMAL LOW (ref 150–400)
RBC: 3.22 MIL/uL — ABNORMAL LOW (ref 4.22–5.81)
RDW: 14.8 % (ref 11.5–15.5)
WBC: 3.4 10*3/uL — ABNORMAL LOW (ref 4.0–10.5)
nRBC: 0 % (ref 0.0–0.2)

## 2018-12-14 LAB — MAGNESIUM: Magnesium: 2.3 mg/dL (ref 1.7–2.4)

## 2018-12-14 LAB — LIPASE, BLOOD: Lipase: 31 U/L (ref 11–51)

## 2018-12-14 MED ORDER — PANTOPRAZOLE SODIUM 40 MG PO TBEC: 40.0000 mg | DELAYED_RELEASE_TABLET | Freq: Every day | ORAL | 1 refills | Status: DC

## 2018-12-14 NOTE — Progress Notes (Signed)
Abdominal pain  Subjective: Pt denies abd pain or nausea, hungry  Objective: Vital signs in last 24 hours: Temp:  [98.8 F (37.1 C)-99.4 F (37.4 C)] 98.8 F (37.1 C) (09/24 0519) Pulse Rate:  [76-90] 77 (09/24 0519) Resp:  [16-18] 18 (09/24 0519) BP: (147-154)/(77-94) 149/77 (09/24 0519) SpO2:  [95 %-97 %] 97 % (09/24 0519) Weight:  [69.9 kg] 69.9 kg (09/24 0436) Last BM Date: 12/13/18  Intake/Output from previous day: 09/23 0701 - 09/24 0700 In: 3021.8 [I.V.:2524.5; IV Piggyback:497.3] Out: 1150 [Urine:1150] Intake/Output this shift: No intake/output data recorded.  General appearance: alert and cooperative GI: normal findings: soft, non-tender  Lab Results:  Results for orders placed or performed during the hospital encounter of 12/12/18 (from the past 24 hour(s))  Magnesium     Status: None   Collection Time: 12/14/18  4:36 AM  Result Value Ref Range   Magnesium 2.3 1.7 - 2.4 mg/dL  CBC     Status: Abnormal   Collection Time: 12/14/18  4:36 AM  Result Value Ref Range   WBC 3.4 (L) 4.0 - 10.5 K/uL   RBC 3.22 (L) 4.22 - 5.81 MIL/uL   Hemoglobin 10.5 (L) 13.0 - 17.0 g/dL   HCT 31.9 (L) 39.0 - 52.0 %   MCV 99.1 80.0 - 100.0 fL   MCH 32.6 26.0 - 34.0 pg   MCHC 32.9 30.0 - 36.0 g/dL   RDW 14.8 11.5 - 15.5 %   Platelets 57 (L) 150 - 400 K/uL   nRBC 0.0 0.0 - 0.2 %  Comprehensive metabolic panel     Status: Abnormal   Collection Time: 12/14/18  4:36 AM  Result Value Ref Range   Sodium 130 (L) 135 - 145 mmol/L   Potassium 4.6 3.5 - 5.1 mmol/L   Chloride 96 (L) 98 - 111 mmol/L   CO2 24 22 - 32 mmol/L   Glucose, Bld 78 70 - 99 mg/dL   BUN 7 6 - 20 mg/dL   Creatinine, Ser 0.60 (L) 0.61 - 1.24 mg/dL   Calcium 8.5 (L) 8.9 - 10.3 mg/dL   Total Protein 7.2 6.5 - 8.1 g/dL   Albumin 3.0 (L) 3.5 - 5.0 g/dL   AST 100 (H) 15 - 41 U/L   ALT 22 0 - 44 U/L   Alkaline Phosphatase 217 (H) 38 - 126 U/L   Total Bilirubin 3.3 (H) 0.3 - 1.2 mg/dL   GFR calc non Af Amer >60 >60  mL/min   GFR calc Af Amer >60 >60 mL/min   Anion gap 10 5 - 15  Lipase, blood     Status: None   Collection Time: 12/14/18  4:36 AM  Result Value Ref Range   Lipase 31 11 - 51 U/L     Studies/Results Radiology     MEDS, Scheduled . folic acid  1 mg Oral Daily  . LORazepam  0-4 mg Intravenous Q6H   Followed by  . [START ON 12/15/2018] LORazepam  0-4 mg Intravenous Q12H  . multivitamin with minerals  1 tablet Oral Daily  . pantoprazole (PROTONIX) IV  40 mg Intravenous Q12H  . thiamine  100 mg Oral Daily   Or  . thiamine  100 mg Intravenous Daily     Assessment: Abdominal pain: seems to be resolving  Plan: Advance diet when felt to be appropriate.  If pt develops no further abd pain with bowel function, then there should be nothing else to do with CT findings.  Rosario Adie, MD  Colorectal and General Surgery [facility]   LOS: 1 day    Rosario Adie, [facility], [place]   12/14/2018 9:19 AM

## 2018-12-14 NOTE — Progress Notes (Signed)
D/c ing to home per bus. Has own bus pass. All d/c instructions given w/ verbal understanding. NAD noted . Denies N/V/D.

## 2018-12-14 NOTE — Progress Notes (Signed)
Most time spent holding on the telephone.

## 2018-12-14 NOTE — Progress Notes (Signed)
To make an appointment at [facility].

## 2018-12-14 NOTE — Discharge Summary (Signed)
Physician Discharge Summary  Barry Moore [id number] DOB: 1966-02-15 DOA: 12/12/2018  PCP: Antony Blackbird, MD  Admit date: 12/12/2018 Discharge date: 12/14/2018  Admitted From:home  Disposition:  home  Recommendations for Outpatient Follow-up:  1. Follow up with PCP in 1-2 weeks 2. Please obtain BMP/CBC in one week   Home Health:none Equipment/Devices:none  Discharge Condition:stable CODE STATUS:full Diet recommendation: cardiac  Brief/Interim Summary:53 y.o. male with history of alcohol abuse and GI bleed who has had last EGD and colonoscopy in May 2020 presents to the ER with complaints of having at least 2 back-to-back seizure.  Also has been having recurrent falls recently.  Over the last 2 days he has been having abdominal pain across the upper part of the abdomen diffuse nonradiating with some nausea vomiting denies any diarrhea.  Given the symptoms patient comes to the ER.  Admits to drinking alcohol every day.  ED Course: In the ER on exam patient has abdominal tenderness in epigastric area with no rigidity.  Labs reveal creatinine of 1.4 lipase 58 AST 116 ALT 26 total bilirubin 1.7 hemoglobin 10 platelets 67 COVID-19 test is pending.  Alcohol level was 323.  CT abdomen pelvis and CT head and C-spine was done.  CT abdomen pelvis shows features concerning for peripancreatic fluid which is worsening.  Also there was some extraluminal air around the transverse colon for which surgery was consulted patient and started on empiric antibiotics.  Admitted for further management of acute pancreatitis with possible extraluminal air concerning for perforation.  Also potential alcohol withdrawal seizure.  On exam patient does have some ecchymotic areas on his left flank and left lower quadrant   Discharge Diagnoses:  Principal Problem:   Abdominal pain Active Problems:   Seizure ([place])   Alcohol abuse   Acute pancreatitis   Free intraperitoneal air   #1 acute pancreatitis patient  treated with n.p.o. IV fluids and pain control.  CT of the abdomen showed possible extraluminal air for which surgery was consulted.  Patient's symptoms resolved by the day of discharge and he tolerated p.o. intake without any increasing abdominal pain nausea vomiting.  He was encouraged to stop drinking alcohol.  She was seen in consult by surgery.  They did not think patient needs any surgical intervention at this time unless his pain increases after eating.  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  #3 chronic thrombocytopenia secondary to #2 stable  #4 anemia of chronic disease with history of GI bleed with evidence of portal gastropathy and peptic ulcer disease by EGD and colonoscopy in May 2020.  I will continue Protonix upon discharge.  Estimated body mass index is 22.11 kg/m as calculated from the following:   Height as of this encounter: 5\' 10"  (1.778 m).   Weight as of this encounter: 69.9 kg.  Discharge Instructions  Discharge Instructions    Call MD for:  difficulty breathing, headache or visual disturbances   Complete by: As directed    Call MD for:  persistant nausea and vomiting   Complete by: As directed    Diet - low sodium heart healthy   Complete by: As directed    Increase activity slowly   Complete by: As directed      Allergies as of 12/14/2018      Reactions   Tomato Shortness Of Breath, Nausea And Vomiting   Aspirin Nausea And Vomiting      Medication List    STOP taking these medications  ondansetron 4 MG tablet Commonly known as: ZOFRAN      Follow-up Information    Fulp, Cammie, MD Follow up.   Specialty: Family Medicine Contact information: [address] [place] [address] [phone]          Allergies  Allergen Reactions  . Tomato Shortness Of Breath and Nausea And Vomiting  . Aspirin Nausea And Vomiting    Consultations:  surgery   Procedures/Studies: Dg  Chest 2 View  Result Date: 12/12/2018 CLINICAL DATA:  Mid chest and left chest pain. Shortness of breath, cough EXAM: CHEST - 2 VIEW COMPARISON:  10/08/2018 FINDINGS: Heart is normal size. Linear densities in the lung bases, likely atelectasis. No effusions. No acute bony abnormality. IMPRESSION: Bibasilar atelectasis. Electronically Signed   By: Rolm Baptise M.D.   On: 12/12/2018 23:26   Ct Head Wo Contrast  Result Date: 12/13/2018 CLINICAL DATA:  53 year old male with posttraumatic headache and seizures. EXAM: CT HEAD WITHOUT CONTRAST CT CERVICAL SPINE WITHOUT CONTRAST TECHNIQUE: Multidetector CT imaging of the head and cervical spine was performed following the standard protocol without intravenous contrast. Multiplanar CT image reconstructions of the cervical spine were also generated. COMPARISON:  Head CT dated 10/08/2018 FINDINGS: CT HEAD FINDINGS Brain: Mild age-related atrophy and chronic microvascular ischemic changes. There is no acute intracranial hemorrhage. No mass effect or midline shift. No extra-axial fluid collection. Vascular: No hyperdense vessel or unexpected calcification. Skull: Normal. Negative for fracture or focal lesion. Sinuses/Orbits: No acute finding. Other: None CT CERVICAL SPINE FINDINGS Alignment: No acute subluxation. Skull base and vertebrae: No acute fracture. Soft tissues and spinal canal: No prevertebral fluid or swelling. No visible canal hematoma. Disc levels:  No acute findings. Mild degenerative changes. Upper chest: Negative. Other: Mild bilateral carotid bulb calcified plaques. IMPRESSION: 1. No acute intracranial hemorrhage. 2. No acute/traumatic cervical spine pathology. Electronically Signed   By: Anner Crete M.D.   On: 12/13/2018 01:52   Ct Cervical Spine Wo Contrast  Result Date: 12/13/2018 CLINICAL DATA:  53 year old male with posttraumatic headache and seizures. EXAM: CT HEAD WITHOUT CONTRAST CT CERVICAL SPINE WITHOUT CONTRAST TECHNIQUE: Multidetector  CT imaging of the head and cervical spine was performed following the standard protocol without intravenous contrast. Multiplanar CT image reconstructions of the cervical spine were also generated. COMPARISON:  Head CT dated 10/08/2018 FINDINGS: CT HEAD FINDINGS Brain: Mild age-related atrophy and chronic microvascular ischemic changes. There is no acute intracranial hemorrhage. No mass effect or midline shift. No extra-axial fluid collection. Vascular: No hyperdense vessel or unexpected calcification. Skull: Normal. Negative for fracture or focal lesion. Sinuses/Orbits: No acute finding. Other: None CT CERVICAL SPINE FINDINGS Alignment: No acute subluxation. Skull base and vertebrae: No acute fracture. Soft tissues and spinal canal: No prevertebral fluid or swelling. No visible canal hematoma. Disc levels:  No acute findings. Mild degenerative changes. Upper chest: Negative. Other: Mild bilateral carotid bulb calcified plaques. IMPRESSION: 1. No acute intracranial hemorrhage. 2. No acute/traumatic cervical spine pathology. Electronically Signed   By: Anner Crete M.D.   On: 12/13/2018 01:52   Ct Abdomen Pelvis W Contrast  Result Date: 12/13/2018 CLINICAL DATA:  Generalized abdominal pain and vomiting. Seizure earlier today with left flank bruising. Intractable hiccups. History of pancreatitis with elevated lipase. EXAM: CT ABDOMEN AND PELVIS WITH CONTRAST TECHNIQUE: Multidetector CT imaging of the abdomen and pelvis was performed using the standard protocol following bolus administration of intravenous contrast. CONTRAST:  166mL OMNIPAQUE IOHEXOL 300 MG/ML  SOLN COMPARISON:  Most recent CT  11/03/2018 FINDINGS: Lower chest: Subpleural atelectasis in the bases, right greater than left. No pleural fluid. Hepatobiliary: Hepatic steatosis. Slight nodular hepatic contour suggesting cirrhosis. Similar appearance of gallbladder wall thickening versus pericholecystic fluid prior exam. No calcified gallstone.  Recannulated umbilical vein. Pancreas: Peripancreatic fat stranding about the head and uncinate process, slightly increased from prior exam. No pancreatic ductal dilatation. No peripancreatic fluid collection. No evidence pancreatic necrosis. Spleen: Normal in size without focal abnormality. Adrenals/Urinary Tract: Normal adrenal glands. No hydronephrosis or perinephric edema. Homogeneous renal enhancement with symmetric excretion on delayed phase imaging. Urinary bladder is partially distended. There is urinary bladder wall thickening, similar to prior exam. Stomach/Bowel: Distal esophageal wall thickening. Nondistended stomach limiting assessment. No small bowel obstruction or inflammation. Appendix not visualized. Moderate stool throughout the colon without colonic wall thickening. Small volume of extraluminal mesenteric air adjacent to the transverse colon, series 2, image 93, without associated colonic wall thickening. No adjacent inflammatory change. Vascular/Lymphatic: Aortic atherosclerosis. No aneurysm. Portal and splenic veins are patent. Recannulization of the umbilical vein. Prominent lymph node adjacent to the pancreatic head measuring 9 mm, series 2, image 31, similar to prior exam. Reproductive: Prostate is unremarkable. Other: Small focal subcutaneous contusion involving the left lateral flank. No significant ascites. Small focus of extraluminal mesenteric air in the right mid abdomen, series 2, image 41. small fat containing hernia containing recannulated umbilical vein and venous collaterals. Musculoskeletal: There are no acute or suspicious osseous abnormalities. Critical Value/emergent results were called by telephone at the time of interpretation on 12/13/2018 at 2:14 am to PA [place] , who verbally acknowledged these results. IMPRESSION: 1. Peripancreatic fat stranding about the head and uncinate process suspicious for acute pancreatitis, slightly progressed from exam last month. No  peripancreatic fluid collection or pancreatic necrosis. 2. Small volume of extraluminal mesenteric air adjacent to the transverse colon, without associated colonic wall thickening or adjacent inflammation. Underlying etiology and significance is uncertain. 3. Hepatic steatosis. Slight nodular hepatic contour suggesting cirrhosis. Recannulated umbilical vein consistent with portal hypertension. 4. Similar appearance of gallbladder to prior with wall thickening versus pericholecystic fluid. 5. Chronic urinary bladder wall thickening. 6. Small focal subcutaneous contusion involving the left lateral flank. 7. Small fat containing hernia. 8. Distal esophageal wall thickening. Aortic Atherosclerosis (ICD10-I70.0). Electronically Signed   By: Keith Rake M.D.   On: 12/13/2018 02:14    (Echo, Carotid, EGD, Colonoscopy, ERCP)    Subjective:  Awake alert hungry anxious to eat denies abdominal pain Discharge Exam: Vitals:   12/14/18 0002 12/14/18 0519  BP: (!) 154/86 (!) 149/77  Pulse: 76 77  Resp: 16 18  Temp: 98.9 F (37.2 C) 98.8 F (37.1 C)  SpO2: 95% 97%   Vitals:   12/13/18 2011 12/14/18 0002 12/14/18 0436 12/14/18 0519  BP: (!) 151/94 (!) 154/86  (!) 149/77  Pulse: 90 76  77  Resp: 16 16  18   Temp: 99.4 F (37.4 C) 98.9 F (37.2 C)  98.8 F (37.1 C)  TempSrc: Oral Oral  Oral  SpO2: 96% 95%  97%  Weight:   69.9 kg   Height:        General: Pt is alert, awake, not in acute distress Cardiovascular: RRR, S1/S2 +, no rubs, no gallops Respiratory: CTA bilaterally, no wheezing, no rhonchi Abdominal: Soft, NT, ND, bowel sounds + Extremities: no edema, no cyanosis    The results of significant diagnostics from this hospitalization (including imaging, microbiology, ancillary and laboratory) are listed below for reference.  Microbiology: Recent Results (from the past 240 hour(s))  SARS CORONAVIRUS 2 (TAT 6-24 HRS) Nasopharyngeal Nasopharyngeal Swab     Status: None    Collection Time: 12/13/18  2:44 AM   Specimen: Nasopharyngeal Swab  Result Value Ref Range Status   SARS Coronavirus 2 NEGATIVE NEGATIVE Final    Comment: (NOTE) SARS-CoV-2 target nucleic acids are NOT DETECTED. The SARS-CoV-2 RNA is generally detectable in upper and lower respiratory specimens during the acute phase of infection. Negative results do not preclude SARS-CoV-2 infection, do not rule out co-infections with other pathogens, and should not be used as the sole basis for treatment or other patient management decisions. Negative results must be combined with clinical observations, patient history, and epidemiological information. The expected result is Negative. Fact Sheet for Patients: SugarRoll.be Fact Sheet for Healthcare Providers: https://www.woods-mathews.com/ This test is not yet approved or cleared by the [place] FDA and  has been authorized for detection and/or diagnosis of SARS-CoV-2 by FDA under an Emergency Use Authorization (EUA). This EUA will remain  in effect (meaning this test can be used) for the duration of the COVID-19 declaration under Section 56 4(b)(1) of the Act, 21 U.S.C. section 360bbb-3(b)(1), unless the authorization is terminated or revoked sooner. Performed at [facility], [place] [address] [place], [place] [address]      Labs: BNP (last 3 results) No results for input(s): BNP in the last 8760 hours. Basic Metabolic Panel: Recent Labs  Lab 12/12/18 2251 12/13/18 0528 12/14/18 0436  NA 136 136 130*  K 3.3* 3.2* 4.6  CL 100 102 96*  CO2 26 23 24   GLUCOSE 100* 89 78  BUN 5* <5* 7  CREATININE 0.44* 0.51* 0.60*  CALCIUM 8.2* 8.0* 8.5*  MG 2.3  --  2.3  PHOS 3.4  --   --    Liver Function Tests: Recent Labs  Lab 12/12/18 2251 12/14/18 0436  AST 116* 100*  ALT 26 22  ALKPHOS 244* 217*  BILITOT 1.7* 3.3*  PROT 7.7 7.2  ALBUMIN 3.2* 3.0*   Recent Labs  Lab 12/12/18 2251  12/14/18 0436  LIPASE 58* 31   No results for input(s): AMMONIA in the last 168 hours. CBC: Recent Labs  Lab 12/12/18 2251 12/13/18 0528 12/14/18 0436  WBC 4.2 2.7* 3.4*  NEUTROABS 2.2  --   --   HGB 10.0* 9.6* 10.5*  HCT 30.9* 29.3* 31.9*  MCV 98.7 99.3 99.1  PLT 67* 61* 57*   Cardiac Enzymes: No results for input(s): CKTOTAL, CKMB, CKMBINDEX, TROPONINI in the last 168 hours. BNP: Invalid input(s): POCBNP CBG: No results for input(s): GLUCAP in the last 168 hours. D-Dimer No results for input(s): DDIMER in the last 72 hours. Hgb A1c No results for input(s): HGBA1C in the last 72 hours. Lipid Profile No results for input(s): CHOL, HDL, LDLCALC, TRIG, CHOLHDL, LDLDIRECT in the last 72 hours. Thyroid function studies No results for input(s): TSH, T4TOTAL, T3FREE, THYROIDAB in the last 72 hours.  Invalid input(s): FREET3 Anemia work up No results for input(s): VITAMINB12, FOLATE, FERRITIN, TIBC, IRON, RETICCTPCT in the last 72 hours. Urinalysis    Component Value Date/Time   COLORURINE YELLOW 12/12/2018 2355   APPEARANCEUR CLEAR 12/12/2018 2355   LABSPEC 1.008 12/12/2018 2355   PHURINE 6.0 12/12/2018 [place] 12/12/2018 2355   HGBUR NEGATIVE 12/12/2018 2355   BILIRUBINUR NEGATIVE 12/12/2018 [place] 12/12/2018 2355   PROTEINUR NEGATIVE 12/12/2018 2355   NITRITE NEGATIVE 12/12/2018  [place] 12/12/2018 2355   Sepsis Labs Invalid input(s): PROCALCITONIN,  WBC,  LACTICIDVEN Microbiology Recent Results (from the past 240 hour(s))  SARS CORONAVIRUS 2 (TAT 6-24 HRS) Nasopharyngeal Nasopharyngeal Swab     Status: None   Collection Time: 12/13/18  2:44 AM   Specimen: Nasopharyngeal Swab  Result Value Ref Range Status   SARS Coronavirus 2 NEGATIVE NEGATIVE Final    Comment: (NOTE) SARS-CoV-2 target nucleic acids are NOT DETECTED. The SARS-CoV-2 RNA is generally detectable in upper and lower respiratory specimens  during the acute phase of infection. Negative results do not preclude SARS-CoV-2 infection, do not rule out co-infections with other pathogens, and should not be used as the sole basis for treatment or other patient management decisions. Negative results must be combined with clinical observations, patient history, and epidemiological information. The expected result is Negative. Fact Sheet for Patients: SugarRoll.be Fact Sheet for Healthcare Providers: https://www.woods-mathews.com/ This test is not yet approved or cleared by the [place] FDA and  has been authorized for detection and/or diagnosis of SARS-CoV-2 by FDA under an Emergency Use Authorization (EUA). This EUA will remain  in effect (meaning this test can be used) for the duration of the COVID-19 declaration under Section 56 4(b)(1) of the Act, 21 U.S.C. section 360bbb-3(b)(1), unless the authorization is terminated or revoked sooner. Performed at [facility], [place] [address], [place], [place] [address]      Time coordinating discharge:  36 minutes  SIGNED:   Georgette Shell, MD  [facility] 12/14/2018, 10:57 AM Pager   If 7PM-7AM, please contact night-coverage [email or web address] Password [email or web address]

## 2018-12-31 ENCOUNTER — Other Ambulatory Visit: Payer: Self-pay

## 2018-12-31 ENCOUNTER — Inpatient Hospital Stay (HOSPITAL_COMMUNITY)
Admission: EM | Admit: 2018-12-31 | Discharge: 2019-01-03 | DRG: 377 | Disposition: A | Discharge status: Home / Self Care | Payer: Self-pay | Attending: Internal Medicine | Admitting: Internal Medicine

## 2018-12-31 DIAGNOSIS — F17210 Nicotine dependence, cigarettes, uncomplicated: Secondary | ICD-10-CM | POA: Diagnosis present

## 2018-12-31 DIAGNOSIS — Z79899 Other long term (current) drug therapy: Secondary | ICD-10-CM

## 2018-12-31 DIAGNOSIS — R066 Hiccough: Secondary | ICD-10-CM | POA: Diagnosis present

## 2018-12-31 DIAGNOSIS — E162 Hypoglycemia, unspecified: Secondary | ICD-10-CM | POA: Diagnosis present

## 2018-12-31 DIAGNOSIS — D61818 Other pancytopenia: Secondary | ICD-10-CM | POA: Diagnosis present

## 2018-12-31 DIAGNOSIS — Y908 Blood alcohol level of 240 mg/100 ml or more: Secondary | ICD-10-CM | POA: Diagnosis present

## 2018-12-31 DIAGNOSIS — D696 Thrombocytopenia, unspecified: Secondary | ICD-10-CM | POA: Diagnosis present

## 2018-12-31 DIAGNOSIS — E871 Hypo-osmolality and hyponatremia: Secondary | ICD-10-CM | POA: Diagnosis not present

## 2018-12-31 DIAGNOSIS — E872 Acidosis: Secondary | ICD-10-CM | POA: Diagnosis present

## 2018-12-31 DIAGNOSIS — Z91018 Allergy to other foods: Secondary | ICD-10-CM

## 2018-12-31 DIAGNOSIS — K766 Portal hypertension: Secondary | ICD-10-CM | POA: Diagnosis present

## 2018-12-31 DIAGNOSIS — K922 Gastrointestinal hemorrhage, unspecified: Secondary | ICD-10-CM

## 2018-12-31 DIAGNOSIS — F1010 Alcohol abuse, uncomplicated: Secondary | ICD-10-CM

## 2018-12-31 DIAGNOSIS — E876 Hypokalemia: Secondary | ICD-10-CM | POA: Diagnosis present

## 2018-12-31 DIAGNOSIS — D62 Acute posthemorrhagic anemia: Secondary | ICD-10-CM | POA: Diagnosis present

## 2018-12-31 DIAGNOSIS — K7030 Alcoholic cirrhosis of liver without ascites: Secondary | ICD-10-CM | POA: Diagnosis present

## 2018-12-31 DIAGNOSIS — Z886 Allergy status to analgesic agent status: Secondary | ICD-10-CM

## 2018-12-31 DIAGNOSIS — Z91128 Patient's intentional underdosing of medication regimen for other reason: Secondary | ICD-10-CM

## 2018-12-31 DIAGNOSIS — Z720 Tobacco use: Secondary | ICD-10-CM | POA: Diagnosis present

## 2018-12-31 DIAGNOSIS — K3189 Other diseases of stomach and duodenum: Secondary | ICD-10-CM | POA: Diagnosis present

## 2018-12-31 DIAGNOSIS — K709 Alcoholic liver disease, unspecified: Secondary | ICD-10-CM | POA: Diagnosis present

## 2018-12-31 DIAGNOSIS — I10 Essential (primary) hypertension: Secondary | ICD-10-CM | POA: Diagnosis present

## 2018-12-31 DIAGNOSIS — Z8711 Personal history of peptic ulcer disease: Secondary | ICD-10-CM

## 2018-12-31 DIAGNOSIS — T471X6A Underdosing of other antacids and anti-gastric-secretion drugs, initial encounter: Secondary | ICD-10-CM | POA: Diagnosis present

## 2018-12-31 DIAGNOSIS — K2981 Duodenitis with bleeding: Secondary | ICD-10-CM | POA: Diagnosis present

## 2018-12-31 DIAGNOSIS — K264 Chronic or unspecified duodenal ulcer with hemorrhage: Principal | ICD-10-CM | POA: Diagnosis present

## 2018-12-31 DIAGNOSIS — Z9114 Patient's other noncompliance with medication regimen: Secondary | ICD-10-CM

## 2018-12-31 DIAGNOSIS — Z20828 Contact with and (suspected) exposure to other viral communicable diseases: Secondary | ICD-10-CM | POA: Diagnosis present

## 2018-12-31 DIAGNOSIS — K2980 Duodenitis without bleeding: Secondary | ICD-10-CM

## 2018-12-31 DIAGNOSIS — Y92009 Unspecified place in unspecified non-institutional (private) residence as the place of occurrence of the external cause: Secondary | ICD-10-CM

## 2018-12-31 DIAGNOSIS — K859 Acute pancreatitis without necrosis or infection, unspecified: Secondary | ICD-10-CM | POA: Diagnosis present

## 2018-12-31 DIAGNOSIS — F1020 Alcohol dependence, uncomplicated: Secondary | ICD-10-CM | POA: Diagnosis present

## 2018-12-31 DIAGNOSIS — I8510 Secondary esophageal varices without bleeding: Secondary | ICD-10-CM | POA: Diagnosis present

## 2018-12-31 DIAGNOSIS — Z811 Family history of alcohol abuse and dependence: Secondary | ICD-10-CM

## 2018-12-31 LAB — POC OCCULT BLOOD, ED: Fecal Occult Bld: POSITIVE — AB

## 2018-12-31 MED ORDER — SODIUM CHLORIDE 0.9 % IV SOLN
8.0000 mg/h | INTRAVENOUS | Status: DC
  Administered 2018-12-31 – 2019-01-02 (×3): 8 mg/h via INTRAVENOUS
  Filled 2018-12-31 (×4): qty 80

## 2018-12-31 MED ORDER — SODIUM CHLORIDE 0.9 % IV BOLUS
1000.0000 mL | Freq: Once | INTRAVENOUS | Status: AC
  Administered 2018-12-31: 23:00:00 1000 mL via INTRAVENOUS

## 2018-12-31 MED ORDER — PANTOPRAZOLE SODIUM 40 MG IV SOLR: 40.0000 mg | Freq: Two times a day (BID) | INTRAVENOUS | Status: DC

## 2018-12-31 MED ORDER — SODIUM CHLORIDE 0.9 % IV SOLN
80.0000 mg | Freq: Once | INTRAVENOUS | Status: AC
  Administered 2018-12-31: 80 mg via INTRAVENOUS
  Filled 2018-12-31: qty 80

## 2018-12-31 NOTE — ED Triage Notes (Signed)
Pt presents to ED via [facility] coming from home c/o GI bleeding x3-4 days with abdominal pain as well. Pt has hx of same and has been self medicating with ETOH. Pt was ambulatory on scene and A&Ox4.   136/74 HR 90 RR 18 98% RA 98.1 110 CBG

## 2018-12-31 NOTE — ED Provider Notes (Signed)
ED ECG REPORT   Date: 12/31/2018  Rate: 82  Rhythm: normal sinus rhythm  QRS Axis: normal  Intervals: normal  ST/T Wave abnormalities: nonspecific ST/T changes  Conduction Disutrbances:none  Narrative Interpretation: artifact limits interpretation  Old EKG Reviewed: unchanged  I have personally reviewed the EKG tracing and agree with the computerized printout as noted.    Ezequiel Essex, MD 12/31/18 2342

## 2018-12-31 NOTE — ED Provider Notes (Signed)
[facility] DEPT Provider Note   CSN: [id number] Arrival date & time: 12/31/18  2141     History   Chief Complaint Chief Complaint  Patient presents with  . GI Bleeding    HPI Barry Moore is a 53 y.o. male.     The history is provided by the patient and medical records.     53 y.o. M with history of alcoholic hepatitis, depression, alcohol abuse, GI bleeding, seizures, pancreatitis, presenting to the ED for GI bleeding.  Patient reports history of same.  States this started today, he has had about 3 episodes of dark, tarry stools.  He denies any hematemesis.  He has hiccups but states "I have them all the time".  He reports abdominal pain which is chronic for him.  Patient has history of GI bleeding in the past, seen by GI in the hospital and had endoscopy/colonscopy in May 2020.   He is not currently on anticoagulation.  He does report he has been drinking alcohol recently, last drink was a few hours ago.   Findings from EGD 08/05/18: White nummular lesions were noted in the entire esophagus. Biopsies were taken with a cold forceps for histology. 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 LA Grade B (one or more mucosal breaks greater than 5 mm, not extending between the tops of two mucosal folds) esophagitis with no bleeding was found in the distal esophagus. One tongue of salmon-colored mucosa was present from 37 to 40 cm and scattered islands of salmoncolored mucosa were present at 38 cm. Erosion was present at 40 cm. Mild portal hypertensive gastropathy was found in the gastric body. Multiple dispersed small erosions were found in the gastric body and in the gastric antrum. Biopsies were taken with a cold forceps for histology and Helicobacter pylori testing.  Findings from colonoscopy from 08/07/18: - Hemorrhoids found on perianal exam. - Two 2 to 3 mm polyps in the ascending colon and in the cecum, removed with a  cold snare. Resected and retrieved. - A single (solitary) ulcer in the proximal ascending colon. This is of unclear clinical significance. Biopsied. - The entire examined colon is normal. Biopsied given the unexplained ascending colon ulcer. - The examined portion of the ileum was normal. - The examination was otherwise normal on direct and retroflexion views  Past Medical History:  Diagnosis Date  . Alcoholic hepatitis   . Depression   . ETOH abuse   . Gastric bleed 08/2018  . Hypertension   . Pancreatitis   . Seizures ([place])   . Suicidal behavior     Patient Active Problem List   Diagnosis Date Noted  . Acute pancreatitis 12/13/2018  . Free intraperitoneal air 12/13/2018  . Homicidal ideation   . Gastrointestinal hemorrhage with melena 08/31/2018  . Colon ulcer   . Polyp of ascending colon   . Acute GI bleeding 08/05/2018  . Transaminitis 08/05/2018  . Abdominal pain 08/05/2018  . Nausea and vomiting 08/05/2018  . Substance induced mood disorder ([place]) 07/16/2017  . Severe recurrent major depression without psychotic features ([place]) 05/15/2017  . Anemia 03/18/2017  . Acute blood loss anemia 02/21/2017  . UGI bleed 02/08/2017  . Alcohol withdrawal ([place]) 02/08/2017  . Cocaine abuse ([place]) 11/18/2016  . GI bleed 11/01/2016  . Alcohol abuse   . Major depressive disorder, recurrent severe without psychotic features ([place]) 07/06/2016  . 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 Seizure ([place]) 05/26/2016  .  Tobacco abuse 05/26/2016  . Homeless 05/26/2016  . Essential hypertension 05/26/2016  . Alcohol dependence with alcohol-induced mood disorder ([place]) 02/14/2016  . Severe alcohol withdrawal without perceptual disturbances without complication ([place]) [id number]  . Alcoholic hepatitis without ascites 02/14/2016  . Thrombocytopenia ([place]) 02/14/2016    Past Surgical History:  Procedure Laterality Date  . BIOPSY  08/05/2018   Procedure: BIOPSY;  Surgeon: Irving Copas., MD;  Location: [facility];  Service: Gastroenterology;;  . BIOPSY  08/07/2018   Procedure: BIOPSY;  Surgeon: Thornton Park, MD;  Location: [facility];  Service: Gastroenterology;;  . COLONOSCOPY WITH PROPOFOL N/A 08/07/2018   Procedure: COLONOSCOPY WITH PROPOFOL;  Surgeon: Thornton Park, MD;  Location: [facility];  Service: Gastroenterology;  Laterality: N/A;  . ESOPHAGOGASTRODUODENOSCOPY (EGD) WITH PROPOFOL N/A 11/02/2016   Procedure: ESOPHAGOGASTRODUODENOSCOPY (EGD) WITH PROPOFOL;  Surgeon: Carol Ada, MD;  Location: [facility];  Service: Endoscopy;  Laterality: N/A;  . ESOPHAGOGASTRODUODENOSCOPY (EGD) WITH PROPOFOL N/A 08/05/2018   Procedure: ESOPHAGOGASTRODUODENOSCOPY (EGD) WITH PROPOFOL;  Surgeon: Rush Landmark Telford Nab., MD;  Location: [facility];  Service: Gastroenterology;  Laterality: N/A;  . HERNIA REPAIR    . LEG SURGERY    . POLYPECTOMY  08/07/2018   Procedure: POLYPECTOMY;  Surgeon: Thornton Park, MD;  Location: [facility] ENDOSCOPY;  Service: Gastroenterology;;        Home Medications    Prior to Admission medications   Medication Sig Start Date End Date Taking? Authorizing Provider  pantoprazole (PROTONIX) 40 MG tablet Take 1 tablet (40 mg total) by mouth daily. 12/14/18 12/14/19  Georgette Shell, MD  ferrous sulfate 325 (65 FE) MG tablet Take 1 tablet (325 mg total) by mouth daily with breakfast. Patient not taking: Reported on 09/23/2018 08/09/18 10/09/18  Elodia Florence., MD    Family History Family History  Problem Relation Age of Onset  . Diabetes Mother   . Alcoholism Mother   . Emphysema Father   . Lung cancer Father   . Alcoholism Father     Social History Social History   Tobacco Use  . Smoking status: Current Every Day Smoker    Packs/day: 1.00    Types: Cigarettes  . Smokeless tobacco: Never Used  Substance Use Topics  . Alcohol use: Yes    Comment: 5+ BEERS DAILY  . Drug use: Not Currently    Frequency: 3.0  times per week    Types: Cocaine     Allergies   Tomato and Aspirin   Review of Systems Review of Systems  Gastrointestinal: Positive for abdominal pain and blood in stool.  All other systems reviewed and are negative.    Physical Exam Updated Vital Signs BP 128/79   Pulse 83   Temp 97.9 F (36.6 C) (Oral)   Resp (!) 33   Ht 5\' 9"  (1.753 m)   Wt 69.9 kg   SpO2 97%   BMI 22.74 kg/m   Physical Exam Vitals signs and nursing note reviewed.  Constitutional:      Appearance: He is well-developed.     Comments: hiccupping throughout exam  HENT:     Head: Normocephalic and atraumatic.  Eyes:     Conjunctiva/sclera: Conjunctivae normal.     Pupils: Pupils are equal, round, and reactive to light.  Neck:     Musculoskeletal: Normal range of motion.  Cardiovascular:     Rate and Rhythm: Normal rate and regular rhythm.     Heart sounds: Normal heart sounds.  Pulmonary:     Effort:  Pulmonary effort is normal. No respiratory distress.     Breath sounds: Normal breath sounds. No stridor. No rhonchi.  Abdominal:     General: Bowel sounds are normal.     Palpations: Abdomen is soft.     Hernia: No hernia is present.     Comments: No apparent distention, normal bowel sounds  Genitourinary:    Rectum: Guaiac result positive.     Comments: Exam chaperoned by NT Rectum with some small, nonthrombosed, nonbleeding external hemorrhoids, dark, tarry stool noted on glove, guiac positive Musculoskeletal: Normal range of motion.  Skin:    General: Skin is warm and dry.  Neurological:     Mental Status: He is alert and oriented to person, place, and time.      ED Treatments / Results  Labs (all labs ordered are listed, but only abnormal results are displayed) Labs Reviewed  CBC WITH DIFFERENTIAL/PLATELET - Abnormal; Notable for the following components:      Result Value   RBC 3.10 (*)    Hemoglobin 9.8 (*)    HCT 30.2 (*)    Platelets 35 (*)    All other components  within normal limits  COMPREHENSIVE METABOLIC PANEL - Abnormal; Notable for the following components:   Sodium 134 (*)    Potassium 3.1 (*)    CO2 21 (*)    BUN 5 (*)    Creatinine, Ser 0.56 (*)    Albumin 3.3 (*)    AST 98 (*)    Alkaline Phosphatase 170 (*)    Total Bilirubin 1.7 (*)    All other components within normal limits  ETHANOL - Abnormal; Notable for the following components:   Alcohol, Ethyl (B) 316 (*)    All other components within normal limits  POC OCCULT BLOOD, ED - Abnormal; Notable for the following components:   Fecal Occult Bld POSITIVE (*)    All other components within normal limits  SARS CORONAVIRUS 2 (TAT 6-24 HRS)  APTT  PROTIME-INR  RAPID URINE DRUG SCREEN, HOSP PERFORMED  TYPE AND SCREEN    EKG None  Radiology No results found.  Procedures Procedures (including critical care time)  CRITICAL CARE Performed by: Larene Pickett   Total critical care time: 45 minutes  Critical care time was exclusive of separately billable procedures and treating other patients.  Critical care was necessary to treat or prevent imminent or life-threatening deterioration.  Critical care was time spent personally by me on the following activities: development of treatment plan with patient and/or surrogate as well as nursing, discussions with consultants, evaluation of patient's response to treatment, examination of patient, obtaining history from patient or surrogate, ordering and performing treatments and interventions, ordering and review of laboratory studies, ordering and review of radiographic studies, pulse oximetry and re-evaluation of patient's condition.   Medications Ordered in ED Medications  pantoprazole (PROTONIX) 80 mg in sodium chloride 0.9 % 250 mL (0.32 mg/mL) infusion (8 mg/hr Intravenous New Bag/Given 12/31/18 2356)  pantoprazole (PROTONIX) injection 40 mg (has no administration in time range)  LORazepam (ATIVAN) injection 0-4 mg (has no  administration in time range)    Or  LORazepam (ATIVAN) tablet 0-4 mg (has no administration in time range)  LORazepam (ATIVAN) injection 0-4 mg (has no administration in time range)    Or  LORazepam (ATIVAN) tablet 0-4 mg (has no administration in time range)  thiamine (VITAMIN B-1) tablet 100 mg (has no administration in time range)    Or  thiamine (B-1) injection  100 mg (has no administration in time range)  pantoprazole (PROTONIX) 80 mg in sodium chloride 0.9 % 100 mL IVPB (0 mg Intravenous Stopped 12/31/18 2346)  sodium chloride 0.9 % bolus 1,000 mL (1,000 mLs Intravenous New Bag/Given 12/31/18 2315)     Initial Impression / Assessment and Plan / ED Course  I have reviewed the triage vital signs and the nursing notes.  Pertinent labs & imaging results that were available during my care of the patient were reviewed by me and considered in my medical decision making (see chart for details).  53 year old male with history of alcohol abuse, presenting to the ED with GI bleeding.  He reports 3 episodes of dark, tarry stools today.  Has had some abdominal pain but this is chronic.  He has been self-medicating with alcohol which is his baseline.  He is awake, alert, appropriately oriented here.  He is hiccuping throughout exam, he reports he chronically has hiccups.  His abdomen is overall soft and benign.  Rectal exam was performed with dark, tarry stool noted.  Hemoccult is positive.  Last EGD with esophageal varices as well as findings of esophagitis in May 2020.  He will likely require admission.  Labs pending.  Given IVF and IV protonix bolus followed by drip.  Labs with hemoglobin of 9.8, slight drop from 10.5 on 9/24.  Platelet count of 35, he has a chronic thrombocytopenia.  No significant electrolyte balance.  Ethanol is 319.  Patient remains hemodynamically stable here.  No further dark stools here. He is not displaying any signs of alcohol withdrawal, however given his history of  chronic alcohol abuse he was placed on CIWA protocol.  COVID swab will be sent.  Will admit for ongoing management.  GI can be consulted in the morning if needed as I do not feel he requires any emergent intervention at this time.  Discussed with Dr. Marlowe Sax-- she will admit for ongoing care.  Of note, patient with documented tachypnea, however he is not truly tachypneic during assessments.  He is not complaining of any shortness of breath.  This may be due to his ongoing hiccups.  Will monitor.  Final Clinical Impressions(s) / ED Diagnoses   Final diagnoses:  Upper GI bleed  Alcohol abuse    ED Discharge Orders    None       Larene Pickett, PA-C 01/01/19 0112    Margette Fast, MD 01/01/19 1410

## 2019-01-01 ENCOUNTER — Observation Stay (HOSPITAL_COMMUNITY): Payer: Self-pay

## 2019-01-01 ENCOUNTER — Other Ambulatory Visit: Payer: Self-pay

## 2019-01-01 ENCOUNTER — Encounter (HOSPITAL_COMMUNITY): Payer: Self-pay

## 2019-01-01 DIAGNOSIS — K922 Gastrointestinal hemorrhage, unspecified: Secondary | ICD-10-CM | POA: Diagnosis present

## 2019-01-01 DIAGNOSIS — R933 Abnormal findings on diagnostic imaging of other parts of digestive tract: Secondary | ICD-10-CM

## 2019-01-01 DIAGNOSIS — E876 Hypokalemia: Secondary | ICD-10-CM

## 2019-01-01 DIAGNOSIS — F1020 Alcohol dependence, uncomplicated: Secondary | ICD-10-CM

## 2019-01-01 DIAGNOSIS — K709 Alcoholic liver disease, unspecified: Secondary | ICD-10-CM | POA: Diagnosis present

## 2019-01-01 LAB — COMPREHENSIVE METABOLIC PANEL
ALT: 23 U/L (ref 0–44)
AST: 98 U/L — ABNORMAL HIGH (ref 15–41)
Albumin: 3.3 g/dL — ABNORMAL LOW (ref 3.5–5.0)
Alkaline Phosphatase: 170 U/L — ABNORMAL HIGH (ref 38–126)
Anion gap: 11 (ref 5–15)
BUN: 5 mg/dL — ABNORMAL LOW (ref 6–20)
CO2: 21 mmol/L — ABNORMAL LOW (ref 22–32)
Calcium: 9 mg/dL (ref 8.9–10.3)
Chloride: 102 mmol/L (ref 98–111)
Creatinine, Ser: 0.56 mg/dL — ABNORMAL LOW (ref 0.61–1.24)
GFR calc Af Amer: >60 mL/min (ref >60)
GFR calc non Af Amer: >60 mL/min (ref >60)
Glucose, Bld: 96 mg/dL (ref 70–99)
Potassium: 3.1 mmol/L — ABNORMAL LOW (ref 3.5–5.1)
Sodium: 134 mmol/L — ABNORMAL LOW (ref 135–145)
Total Bilirubin: 1.7 mg/dL — ABNORMAL HIGH (ref 0.3–1.2)
Total Protein: 7.8 g/dL (ref 6.5–8.1)

## 2019-01-01 LAB — CBC WITH DIFFERENTIAL/PLATELET
Abs Immature Granulocytes: 0.01 10*3/uL (ref 0.00–0.07)
Basophils Absolute: 0 10*3/uL (ref 0.0–0.1)
Basophils Relative: 1 %
Eosinophils Absolute: 0.1 10*3/uL (ref 0.0–0.5)
Eosinophils Relative: 2 %
HCT: 30.2 % — ABNORMAL LOW (ref 39.0–52.0)
Hemoglobin: 9.8 g/dL — ABNORMAL LOW (ref 13.0–17.0)
Immature Granulocytes: 0 %
Lymphocytes Relative: 33 %
Lymphs Abs: 1.4 10*3/uL (ref 0.7–4.0)
MCH: 31.6 pg (ref 26.0–34.0)
MCHC: 32.5 g/dL (ref 30.0–36.0)
MCV: 97.4 fL (ref 80.0–100.0)
Monocytes Absolute: 0.7 10*3/uL (ref 0.1–1.0)
Monocytes Relative: 17 %
Neutro Abs: 2 10*3/uL (ref 1.7–7.7)
Neutrophils Relative %: 47 %
Platelets: 35 10*3/uL — ABNORMAL LOW (ref 150–400)
RBC: 3.1 MIL/uL — ABNORMAL LOW (ref 4.22–5.81)
RDW: 14 % (ref 11.5–15.5)
WBC: 4.3 10*3/uL (ref 4.0–10.5)
nRBC: 0 % (ref 0.0–0.2)

## 2019-01-01 LAB — HEPATIC FUNCTION PANEL
ALT: 20 U/L (ref 0–44)
AST: 86 U/L — ABNORMAL HIGH (ref 15–41)
Albumin: 3.1 g/dL — ABNORMAL LOW (ref 3.5–5.0)
Alkaline Phosphatase: 152 U/L — ABNORMAL HIGH (ref 38–126)
Bilirubin, Direct: 0.6 mg/dL — ABNORMAL HIGH (ref 0.0–0.2)
Indirect Bilirubin: 1.1 mg/dL — ABNORMAL HIGH (ref 0.3–0.9)
Total Bilirubin: 1.7 mg/dL — ABNORMAL HIGH (ref 0.3–1.2)
Total Protein: 6.8 g/dL (ref 6.5–8.1)

## 2019-01-01 LAB — RAPID URINE DRUG SCREEN, HOSP PERFORMED
Amphetamines: NOT DETECTED
Barbiturates: NOT DETECTED
Benzodiazepines: NOT DETECTED
Cocaine: NOT DETECTED
Opiates: NOT DETECTED
Tetrahydrocannabinol: NOT DETECTED

## 2019-01-01 LAB — CBC
HCT: 28.8 % — ABNORMAL LOW (ref 39.0–52.0)
Hemoglobin: 9.3 g/dL — ABNORMAL LOW (ref 13.0–17.0)
MCH: 32 pg (ref 26.0–34.0)
MCHC: 32.3 g/dL (ref 30.0–36.0)
MCV: 99 fL (ref 80.0–100.0)
Platelets: 34 10*3/uL — ABNORMAL LOW (ref 150–400)
RBC: 2.91 MIL/uL — ABNORMAL LOW (ref 4.22–5.81)
RDW: 14.1 % (ref 11.5–15.5)
WBC: 2.9 10*3/uL — ABNORMAL LOW (ref 4.0–10.5)
nRBC: 0 % (ref 0.0–0.2)

## 2019-01-01 LAB — MAGNESIUM: Magnesium: 1.9 mg/dL (ref 1.7–2.4)

## 2019-01-01 LAB — BASIC METABOLIC PANEL
Anion gap: 11 (ref 5–15)
BUN: <5 mg/dL — ABNORMAL LOW (ref 6–20)
CO2: 20 mmol/L — ABNORMAL LOW (ref 22–32)
Calcium: 8.1 mg/dL — ABNORMAL LOW (ref 8.9–10.3)
Chloride: 107 mmol/L (ref 98–111)
Creatinine, Ser: 0.57 mg/dL — ABNORMAL LOW (ref 0.61–1.24)
GFR calc Af Amer: >60 mL/min (ref >60)
GFR calc non Af Amer: >60 mL/min (ref >60)
Glucose, Bld: 67 mg/dL — ABNORMAL LOW (ref 70–99)
Potassium: 4 mmol/L (ref 3.5–5.1)
Sodium: 138 mmol/L (ref 135–145)

## 2019-01-01 LAB — APTT: aPTT: 36 seconds (ref 24–36)

## 2019-01-01 LAB — PROTIME-INR
INR: 1.1 (ref 0.8–1.2)
Prothrombin Time: 14.5 seconds (ref 11.4–15.2)

## 2019-01-01 LAB — TYPE AND SCREEN
ABO/RH(D): O POS
Antibody Screen: NEGATIVE

## 2019-01-01 LAB — GLUCOSE, CAPILLARY: Glucose-Capillary: 252 mg/dL — ABNORMAL HIGH (ref 70–99)

## 2019-01-01 LAB — SARS CORONAVIRUS 2 (TAT 6-24 HRS): SARS Coronavirus 2: NEGATIVE

## 2019-01-01 LAB — ETHANOL: Alcohol, Ethyl (B): 316 mg/dL (ref <10)

## 2019-01-01 LAB — LIPASE, BLOOD: Lipase: 32 U/L (ref 11–51)

## 2019-01-01 MED ORDER — DEXTROSE-NACL 5-0.9 % IV SOLN
INTRAVENOUS | Status: DC
  Administered 2019-01-01 – 2019-01-02 (×2): via INTRAVENOUS

## 2019-01-01 MED ORDER — FOLIC ACID 1 MG PO TABS
1.0000 mg | ORAL_TABLET | Freq: Every day | ORAL | Status: DC
  Administered 2019-01-02 – 2019-01-03 (×2): 1 mg via ORAL
  Filled 2019-01-01 (×2): qty 1

## 2019-01-01 MED ORDER — LORAZEPAM 2 MG/ML IJ SOLN
0.0000 mg | Freq: Four times a day (QID) | INTRAMUSCULAR | Status: AC
  Administered 2019-01-01 (×2): 2 mg via INTRAVENOUS
  Filled 2019-01-01 (×2): qty 1

## 2019-01-01 MED ORDER — VITAMIN B-1 100 MG PO TABS
100.0000 mg | ORAL_TABLET | Freq: Every day | ORAL | Status: DC
  Administered 2019-01-02 – 2019-01-03 (×2): 100 mg via ORAL
  Filled 2019-01-01 (×2): qty 1

## 2019-01-01 MED ORDER — THIAMINE HCL 100 MG/ML IJ SOLN
100.0000 mg | Freq: Every day | INTRAMUSCULAR | Status: DC
  Administered 2019-01-01: 09:00:00 100 mg via INTRAVENOUS
  Filled 2019-01-01: qty 2

## 2019-01-01 MED ORDER — SODIUM CHLORIDE 0.9% IV SOLUTION
Freq: Once | INTRAVENOUS | Status: AC
  Administered 2019-01-01: 03:00:00 via INTRAVENOUS

## 2019-01-01 MED ORDER — ADULT MULTIVITAMIN W/MINERALS CH
1.0000 | ORAL_TABLET | Freq: Every day | ORAL | Status: DC
  Administered 2019-01-02 – 2019-01-03 (×2): 1 via ORAL
  Filled 2019-01-01 (×2): qty 1

## 2019-01-01 MED ORDER — LORAZEPAM 2 MG/ML IJ SOLN: 0.0000 mg | Freq: Two times a day (BID) | INTRAMUSCULAR | Status: DC

## 2019-01-01 MED ORDER — SODIUM CHLORIDE 0.9 % IV SOLN
INTRAVENOUS | Status: DC
  Administered 2019-01-01: 02:00:00 via INTRAVENOUS

## 2019-01-01 MED ORDER — POTASSIUM CHLORIDE 10 MEQ/100ML IV SOLN
10.0000 meq | INTRAVENOUS | Status: DC
  Administered 2019-01-01 (×3): 10 meq via INTRAVENOUS
  Filled 2019-01-01 (×4): qty 100

## 2019-01-01 MED ORDER — NICOTINE 21 MG/24HR TD PT24
21.0000 mg | MEDICATED_PATCH | Freq: Every day | TRANSDERMAL | Status: DC
  Administered 2019-01-01 – 2019-01-03 (×3): 21 mg via TRANSDERMAL
  Filled 2019-01-01 (×3): qty 1

## 2019-01-01 MED ORDER — SODIUM CHLORIDE (PF) 0.9 % IJ SOLN
INTRAMUSCULAR | Status: AC
  Administered 2019-01-01: 06:00:00
  Filled 2019-01-01: qty 50

## 2019-01-01 MED ORDER — LORAZEPAM 1 MG PO TABS
0.0000 mg | ORAL_TABLET | Freq: Two times a day (BID) | ORAL | Status: DC
  Administered 2019-01-03: 08:00:00 2 mg via ORAL
  Filled 2019-01-01 (×2): qty 2

## 2019-01-01 MED ORDER — SODIUM CHLORIDE 0.9 % IV SOLN
50.0000 ug/h | INTRAVENOUS | Status: DC
  Administered 2019-01-01 – 2019-01-02 (×3): 50 ug/h via INTRAVENOUS
  Filled 2019-01-01 (×3): qty 1

## 2019-01-01 MED ORDER — IOHEXOL 350 MG/ML SOLN
100.0000 mL | Freq: Once | INTRAVENOUS | Status: AC | PRN
  Administered 2019-01-01: 03:00:00 100 mL via INTRAVENOUS

## 2019-01-01 MED ORDER — OCTREOTIDE LOAD VIA INFUSION
50.0000 ug | Freq: Once | INTRAVENOUS | Status: AC
  Administered 2019-01-01: 03:00:00 50 ug via INTRAVENOUS
  Filled 2019-01-01: qty 25

## 2019-01-01 MED ORDER — LORAZEPAM 1 MG PO TABS
0.0000 mg | ORAL_TABLET | Freq: Four times a day (QID) | ORAL | Status: AC
  Administered 2019-01-02 – 2019-01-03 (×3): 2 mg via ORAL
  Filled 2019-01-01 (×2): qty 2

## 2019-01-01 NOTE — Consult Note (Addendum)
Referring Provider:  [facility]         Primary Care Physician:  Antony Blackbird, MD Primary Gastroenterologist: Justice Britain, MD            Reason for Consultation:  GI bleed                 ASSESSMENT /  PLAN    1. 53 yo male with Etoh abuse / hx of duodenal ulcer / GI bleed admitted with diffuse mid abdominal pain, dark heme positive stools on iron, dry heaves.  CT scan raises some concern for pancreatitis confusing the picture a little bit. Hgb drifting over last few weeks from 11 >>10 >> 9.8 >> 9.3 today.  -Will plan for EGD in am (was going to do today but SARS not resulted) -Has history of portal hypertensive gastropathy, has continued to imbibe and platelets only 34 so continue Octreotide gtt.   -Continue PPI gtt -Monitor hgb -Clear liquids today, NPO after midnight  2. Etoh abuse, on [place] Attending   I have taken an interval history, reviewed the chart and examined the patient. I agree with the Advanced Practitioner's note, impression and recommendations.     Given persistent and recurrent signs/sxs over months, imaging and lab abnormalities - I think EGD appropriate.  ? Persistent duodenal ulcer vs pancreatitis or both. I favor ulcer   Gatha Mayer, MD, [facility] 01/01/2019 11:18 AM Pager [phone] HPI:     Barry Moore is a 53 y.o. male with a PMH significant for Etoh hepatitis, Etoh pancreatitis, cocaine use, duodenal ulcer, candida esophagitis, hiatal hernia, Schatzki ring, hemorrhoids, seizures, HTN, depression, iron deficiency anemia. Admitted in May and again in June of this year for GI bleeding.   Patient presented to ED yesterday with abdominal pain dark stools, heme + stool ( on iron). Stools normally dark on iron but were on the loose side last week. He has been drinking heavilty. Lipase was 32. CTA abd / pelvis - haziness and soft tissue stranding at the pancreaticoduodenal groove with soft tissue  thickening and small fluid in the right anterior pararenal space. He is a poor historian but described diffuse mid abdominal pain which started yesterday, resolved in an hour of so. He reports a couple of months of frequent dry heaves / "gagging". No hematemesis. He has been our of iron pills for a few days but is compliant with BID PPI, no NSAID  EGD May 2020 White nummular lesions in esophageal mucosa. Biopsied. - Grade I and small (< 5 mm) esophageal varices. - LA Grade B esophagitis. - Salmon-colored mucosa suspicious for short-segment Barrett's esophagus. - Portal hypertensive gastropathy. - Erosive gastropathy. Biopsied. - Duodenal ulcer with a clean ulcer base (Forrest Class III). - Nodular mucosa in the duodenal bulb. Biopsied. - No gross lesions in the second portion of the duodenum.  Path - Chronic gastritis, candida esophagitis, no Barrett's esophagua   Colonoscopy May 2020 Hemorrhoids found on perianal exam. - Two 2 to 3 mm polyps in the ascending colon and in the cecum, removed with a cold snare. Resected and retrieved. - A single (solitary) ulcer in the proximal ascending colon. This is of unclear clinical significance. Biopsied. - The entire examined colon is normal. Biopsied given the unexplained ascending colon ulcer. - The examined portion of the ileum was normal. - The examination was otherwise normal on direct and retroflexion views.  Diagnosis 1. Colon, polyp(s), cecal and distal ascending -  TUBULAR ADENOMA WITHOUT HIGH GRADE DYSPLASIA. 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 2. Colon, biopsy, proximal ascending ulcer - COLONIC MUCOSA WITH ISCHEMIC-TYPE CHANGES, CLINICALLY ULCER. 3. Colon, biopsy, random - COLONIC MUCOSA WITH NO SIGNIFICANT PATHOLOGIC FINDINGS. - NEGATIVE FOR ACTIVE INFLAMMATION AND OTHER ABNORMALITIES.  Past Medical History:  Diagnosis Date   Alcoholic hepatitis    Depression    ETOH abuse    Gastric bleed 08/2018     Hypertension    Pancreatitis    Seizures ([place])    Suicidal behavior     Past Surgical History:  Procedure Laterality Date   BIOPSY  08/05/2018   Procedure: BIOPSY;  Surgeon: Irving Copas., MD;  Location: [place];  Service: Gastroenterology;;   BIOPSY  08/07/2018   Procedure: BIOPSY;  Surgeon: Thornton Park, MD;  Location: [place];  Service: Gastroenterology;;   COLONOSCOPY WITH PROPOFOL N/A 08/07/2018   Procedure: COLONOSCOPY WITH PROPOFOL;  Surgeon: Thornton Park, MD;  Location: [place];  Service: Gastroenterology;  Laterality: N/A;   ESOPHAGOGASTRODUODENOSCOPY (EGD) WITH PROPOFOL N/A 11/02/2016   Procedure: ESOPHAGOGASTRODUODENOSCOPY (EGD) WITH PROPOFOL;  Surgeon: Carol Ada, MD;  Location: [facility];  Service: Endoscopy;  Laterality: N/A;   ESOPHAGOGASTRODUODENOSCOPY (EGD) WITH PROPOFOL N/A 08/05/2018   Procedure: ESOPHAGOGASTRODUODENOSCOPY (EGD) WITH PROPOFOL;  Surgeon: Rush Landmark Telford Nab., MD;  Location: [place];  Service: Gastroenterology;  Laterality: N/A;   HERNIA REPAIR     LEG SURGERY     POLYPECTOMY  08/07/2018   Procedure: POLYPECTOMY;  Surgeon: Thornton Park, MD;  Location: [place];  Service: Gastroenterology;;    Prior to Admission medications   Medication Sig Start Date End Date Taking? Authorizing Provider  pantoprazole (PROTONIX) 40 MG tablet Take 1 tablet (40 mg total) by mouth daily. 12/14/18 12/14/19 Yes Georgette Shell, MD  ferrous sulfate 325 (65 FE) MG tablet Take 1 tablet (325 mg total) by mouth daily with breakfast. Patient not taking: Reported on 09/23/2018 08/09/18 10/09/18  Elodia Florence., MD    Current Facility-Administered Medications  Medication Dose Route Frequency Provider Last Rate Last Dose   dextrose 5 %-0.9 % sodium chloride infusion   Intravenous Continuous Nita Sells, MD 100 mL/hr at [id number]     folic acid (FOLVITE) tablet 1 mg  1 mg Oral Daily Shela Leff, MD       LORazepam (ATIVAN) injection 0-4 mg  0-4 mg Intravenous Q6H Shela Leff, MD       Or   LORazepam (ATIVAN) tablet 0-4 mg  0-4 mg Oral Q6H Shela Leff, MD       [START ON 01/03/2019] LORazepam (ATIVAN) injection 0-4 mg  0-4 mg Intravenous Q12H Shela Leff, MD       Or   Derrill Memo ON 01/03/2019] LORazepam (ATIVAN) tablet 0-4 mg  0-4 mg Oral Q12H Shela Leff, MD       multivitamin with minerals tablet 1 tablet  1 tablet Oral Daily Shela Leff, MD       nicotine (NICODERM CQ - dosed in mg/24 hours) patch 21 mg  21 mg Transdermal Daily Shela Leff, MD   21 mg at 01/01/19 0904   octreotide (SANDOSTATIN) 500 mcg in sodium chloride 0.9 % 250 mL (2 mcg/mL) infusion  50 mcg/hr Intravenous Continuous Shela Leff, MD 25 mL/hr at 01/01/19 0329 50 mcg/hr at 01/01/19 0329   pantoprazole (PROTONIX) 80 mg in sodium chloride 0.9 % 250 mL (0.32 mg/mL) infusion  8 mg/hr Intravenous Continuous Shela Leff, MD 25 mL/hr at 01/01/19 0840  8 mg/hr at 01/01/19 0840   [START ON 01/04/2019] pantoprazole (PROTONIX) injection 40 mg  40 mg Intravenous Q12H Shela Leff, MD       thiamine (VITAMIN B-1) tablet 100 mg  100 mg Oral Daily Shela Leff, MD       Or   thiamine (B-1) injection 100 mg  100 mg Intravenous Daily Shela Leff, MD   100 mg at 01/01/19 0904    Allergies as of 12/31/2018 - Review Complete 12/31/2018  Allergen Reaction Noted   Tomato Shortness Of Breath and Nausea And Vomiting 02/28/2017   Aspirin Nausea And Vomiting 11/21/2015    Family History  Problem Relation Age of Onset   Diabetes Mother    Alcoholism Mother    Emphysema Father    Lung cancer Father    Alcoholism Father     Social History   Socioeconomic History   Marital status: Divorced    Spouse name: Not on file   Number of children: Not on file   Years of education: Not on file   Highest education level: Not on file    Occupational History   Not on file  Social Needs   Financial resource strain: Not on file   Food insecurity    Worry: Not on file    Inability: Not on file   Transportation needs    Medical: Not on file    Non-medical: Not on file  Tobacco Use   Smoking status: Current Every Day Smoker    Packs/day: 1.00    Types: Cigarettes   Smokeless tobacco: Never Used  Substance and Sexual Activity   Alcohol use: Yes    Comment: 5+ BEERS DAILY   Drug use: Not Currently    Frequency: 3.0 times per week    Types: Cocaine   Sexual activity: Not Currently  Lifestyle   Physical activity    Days per week: Not on file    Minutes per session: Not on file   Stress: Not on file  Relationships   Social connections    Talks on phone: Not on file    Gets together: Not on file    Attends religious service: Not on file    Active member of club or organization: Not on file    Attends meetings of clubs or organizations: Not on file    Relationship status: Not on file   Intimate partner violence    Fear of current or ex partner: Not on file    Emotionally abused: Not on file    Physically abused: Not on file    Forced sexual activity: Not on file  Other Topics Concern   Not on file  Social History Narrative   Not on file    Review of Systems: All systems reviewed and negative except where noted in HPI.  Physical Exam: Vital signs in last 24 hours: Temp:  [97.9 F (36.6 C)-98.6 F (37 C)] 98.6 F (37 C) (10/12 0858) Pulse Rate:  [67-97] 75 (10/12 0858) Resp:  [0-33] 18 (10/12 0858) BP: (106-143)/(63-83) 143/83 (10/12 0858) SpO2:  [94 %-99 %] 98 % (10/12 0858) Weight:  [69.9 kg-70 kg] 69.9 kg (10/11 2157)   General:   Alert, well-developed,  male in NAD Psych:   cooperative. Normal mood and affect. Eyes:  Pupils equal, sclera clear, no icterus.   Conjunctiva pink. Ears:  Normal auditory acuity. Nose:  No deformity, discharge,  or lesions. Neck:  Supple; no  masses Lungs:  Clear throughout to auscultation.  No wheezes, crackles, or rhonchi.  Heart:  Regular rate and rhythm; no murmurs, no lower extremity edema Abdomen:  Soft, non-distended, nontender, BS active, no palp mass   Rectal:  Deferred  Msk:  Symmetrical without gross deformities. . Neurologic:  Alert and  oriented x4;  grossly normal neurologically. Skin:  Intact without significant lesions or rashes.   Intake/Output from previous day: 10/11 0701 - 10/12 0700 In: 1496 [Blood:296; IV Piggyback:1200] Out: -  Intake/Output this shift: Total I/O In: 742 [I.V.:50; Blood:592; IV Piggyback:100] Out: -   Lab Results: Recent Labs    12/31/18 2310 01/01/19 0514  WBC 4.3 2.9*  HGB 9.8* 9.3*  HCT 30.2* 28.8*  PLT 35* 34*   BMET Recent Labs    12/31/18 2310 01/01/19 0514  NA 134* 138  K 3.1* 4.0  CL 102 107  CO2 21* 20*  GLUCOSE 96 67*  BUN 5* <5*  CREATININE 0.56* 0.57*  CALCIUM 9.0 8.1*   LFT Recent Labs    01/01/19 0514  PROT 6.8  ALBUMIN 3.1*  AST 86*  ALT 20  ALKPHOS 152*  BILITOT 1.7*  BILIDIR 0.6*  IBILI 1.1*   PT/INR Recent Labs    12/31/18 2310  LABPROT 14.5  INR 1.1   Hepatitis Panel No results for input(s): HEPBSAG, HCVAB, HEPAIGM, HEPBIGM in the last 72 hours.   . CBC Latest Ref Rng & Units 01/01/2019 12/31/2018 12/14/2018  WBC 4.0 - 10.5 K/uL 2.9(L) 4.3 3.4(L)  Hemoglobin 13.0 - 17.0 g/dL 9.3(L) 9.8(L) 10.5(L)  Hematocrit 39.0 - 52.0 % 28.8(L) 30.2(L) 31.9(L)  Platelets 150 - 400 K/uL 34(L) 35(L) 57(L)    . CMP Latest Ref Rng & Units 01/01/2019 12/31/2018 12/14/2018  Glucose 70 - 99 mg/dL 67(L) 96 78  BUN 6 - 20 mg/dL <5(L) 5(L) 7  Creatinine 0.61 - 1.24 mg/dL 0.57(L) 0.56(L) 0.60(L)  Sodium 135 - 145 mmol/L 138 134(L) 130(L)  Potassium 3.5 - 5.1 mmol/L 4.0 3.1(L) 4.6  Chloride 98 - 111 mmol/L 107 102 96(L)  CO2 22 - 32 mmol/L 20(L) 21(L) 24  Calcium 8.9 - 10.3 mg/dL 8.1(L) 9.0 8.5(L)  Total Protein 6.5 - 8.1 g/dL 6.8 7.8 7.2   Total Bilirubin 0.3 - 1.2 mg/dL 1.7(H) 1.7(H) 3.3(H)  Alkaline Phos 38 - 126 U/L 152(H) 170(H) 217(H)  AST 15 - 41 U/L 86(H) 98(H) 100(H)  ALT 0 - 44 U/L 20 23 22    Studies/Results: Ct Angio Abd/pel W/ And/or W/o  Result Date: 01/01/2019 CLINICAL DATA:  GI bleeding with abdominal pain EXAM: CTA ABDOMEN AND PELVIS wITHOUT AND WITH CONTRAST TECHNIQUE: Multidetector CT imaging of the abdomen and pelvis was performed using the standard protocol during bolus administration of intravenous contrast. Multiplanar reconstructed images and MIPs were obtained and reviewed to evaluate the vascular anatomy. CONTRAST:  189mL OMNIPAQUE IOHEXOL 350 MG/ML SOLN COMPARISON:  CT 12/13/2018, 11/04/2018, 05/24/2018 FINDINGS: VASCULAR Aorta: Non contrasted images demonstrate no acute intramural hematoma. Aorta is nonaneurysmal. No dissection is seen. Mild aortic atherosclerosis. No occlusion or stenosis. Celiac: Patent without evidence of aneurysm, dissection, vasculitis or significant stenosis. SMA: Patent without evidence of aneurysm, dissection, vasculitis or significant stenosis. Renals: Single right and single left renal arteries are patent. IMA: Patent without evidence of aneurysm, dissection, vasculitis or significant stenosis. Inflow: Patent without evidence of aneurysm, dissection, vasculitis or significant stenosis. Proximal Outflow: Bilateral common femoral and visualized portions of the superficial and profunda femoral arteries are patent without evidence of aneurysm, dissection, vasculitis or significant stenosis. Review of  the MIP images confirms the above findings. NON-VASCULAR Lower chest: Lung bases demonstrate no acute consolidation or effusion. The heart size is normal. Hepatobiliary: Hepatic steatosis. No focal hepatic abnormality. Similar appearance gallbladder wall thickening or edema. No calcified stone or biliary dilatation. Pancreas: No ductal dilatation. Indistinct haziness and soft tissue stranding at  the pancreaticoduodenal groove with soft tissue thickening and small fluid in the right anterior pararenal space. Spleen: Normal in size without focal abnormality. Adrenals/Urinary Tract: Adrenal glands are unremarkable. Kidneys are normal, without renal calculi, focal lesion, or hydronephrosis. Bladder is very distended Stomach/Bowel: The stomach is nonenlarged. No dilated small bowel. No colon wall thickening. No hyperdensity within the lumen of the bowel. The appendix is not well seen. Lymphatic: No increasing adenopathy. Reproductive: Prostate is unremarkable. Other: Negative for free air.  Hazy edema adjacent to the IVC. Musculoskeletal: Degenerative changes. No acute or suspicious osseous abnormality IMPRESSION: VASCULAR 1. Negative for acute extravasation into the bowel to suggest active GI bleeding at this time. 2. Mild aortic atherosclerosis without evidence for aneurysm, dissection, or significant occlusive disease. NON-VASCULAR 1. Redemonstrated edema and soft tissue stranding near the head and uncinate process of the pancreas and adjacent to the duodenum with fluid in the right anterior pararenal space, potentially representing pancreatitis and or duodenitis. The findings do not appear significantly changed since 12/13/2018. 2. Similar appearance of gallbladder wall edema or gallbladder wall thickening Electronically Signed   By: Donavan Foil M.D.   On: 01/01/2019 03:14    Principal Problem:   Acute upper GI bleed Active Problems:   Thrombocytopenia ([place])   Tobacco abuse   Hypokalemia   Alcohol dependence ([place])   Alcoholic liver disease ([place])    Tye Savoy, NP-C @  01/01/2019, 9:26 AM

## 2019-01-01 NOTE — ED Notes (Signed)
Called lab, awaiting them to call back to let nurse know platelets are ready to be picked up.

## 2019-01-01 NOTE — ED Notes (Signed)
Spoke with Dr. Raliegh Ip. Verbal ordere to hold 4th dose of potasium and and hold 2nd platelet.

## 2019-01-01 NOTE — H&P (Addendum)
History and Physical    Barry Moore WTU:[id number] DOB: 11-26-1965 DOA: 12/31/2018  PCP: Antony Blackbird, MD Patient coming from: Home  Chief Complaint: Melena  HPI: Barry Moore is a 53 y.o. male with medical history significant of chronic alcoholism, alcoholic hepatitis, alcoholic pancreatitis, depression, hypertension, seizures presenting to the ED for evaluation of melena.  Patient reports 1 day history of dark tarry stools.  He has not vomited blood.  He is also having lower abdominal pain for the past 1 day.  States he normally does not get abdominal pains.  Reports drinking alcohol heavily on a daily basis and states his last drink was prior to coming into the ED.  He has not been taking Protonix regularly as he does not like taking pills.  ED Course: Has chronic hiccups, not tachypnic. Not tachycardic or hypotensive.  Dark tarry stool noted on rectal exam done in the ED.  Hemoccult positive.  Hemoglobin 9.8, was 10.5 on 9/24.  Platelet count 35,000, chronic thrombocytopenia now worse.  AST 98, alk phos 170, and T bili 1.7.  LFTs previously elevated as well and now improved compared to labs done 2 weeks ago.  INR 1.1. Potassium 3.1.  Blood ethanol level 316.  SARS-CoV-2 test pending. Patient started on CIWA protocol and PPI bolus plus infusion.  Received 1 L fluid bolus.  Review of Systems:  All systems reviewed and apart from history of presenting illness, are negative.  Past Medical History:  Diagnosis Date  . Alcoholic hepatitis   . Depression   . ETOH abuse   . Gastric bleed 08/2018  . Hypertension   . Pancreatitis   . Seizures ([place])   . Suicidal behavior     Past Surgical History:  Procedure Laterality Date  . BIOPSY  08/05/2018   Procedure: BIOPSY;  Surgeon: Irving Copas., MD;  Location: [place];  Service: Gastroenterology;;  . BIOPSY  08/07/2018   Procedure: BIOPSY;  Surgeon: Thornton Park, MD;  Location: [place];  Service: Gastroenterology;;  .  COLONOSCOPY WITH PROPOFOL N/A 08/07/2018   Procedure: COLONOSCOPY WITH PROPOFOL;  Surgeon: Thornton Park, MD;  Location: [place];  Service: Gastroenterology;  Laterality: N/A;  . ESOPHAGOGASTRODUODENOSCOPY (EGD) WITH PROPOFOL N/A 11/02/2016   Procedure: ESOPHAGOGASTRODUODENOSCOPY (EGD) WITH PROPOFOL;  Surgeon: Carol Ada, MD;  Location: [facility];  Service: Endoscopy;  Laterality: N/A;  . ESOPHAGOGASTRODUODENOSCOPY (EGD) WITH PROPOFOL N/A 08/05/2018   Procedure: ESOPHAGOGASTRODUODENOSCOPY (EGD) WITH PROPOFOL;  Surgeon: Rush Landmark Telford Nab., MD;  Location: [place];  Service: Gastroenterology;  Laterality: N/A;  . HERNIA REPAIR    . LEG SURGERY    . POLYPECTOMY  08/07/2018   Procedure: POLYPECTOMY;  Surgeon: Thornton Park, MD;  Location: [place];  Service: Gastroenterology;;     reports that he has been smoking cigarettes. He has been smoking about 1.00 pack per day. He has never used smokeless tobacco. He reports current alcohol use. He reports previous drug use. Frequency: 3.00 times per week. Drug: Cocaine.  Allergies  Allergen Reactions  . Tomato Shortness Of Breath and Nausea And Vomiting  . Aspirin Nausea And Vomiting    Family History  Problem Relation Age of Onset  . Diabetes Mother   . Alcoholism Mother   . Emphysema Father   . Lung cancer Father   . Alcoholism Father     Prior to Admission medications   Medication Sig Start Date End Date Taking? Authorizing Provider  pantoprazole (PROTONIX) 40 MG tablet Take 1 tablet (40 mg total) by mouth daily.  12/14/18 12/14/19 Yes Georgette Shell, MD  ferrous sulfate 325 (65 FE) MG tablet Take 1 tablet (325 mg total) by mouth daily with breakfast. Patient not taking: Reported on 09/23/2018 08/09/18 10/09/18  Elodia Florence., MD    Physical Exam: Vitals:   01/01/19 0000 01/01/19 0030 01/01/19 0100 01/01/19 0125  BP: 121/67 135/78 113/71 113/71  Pulse: 87  88 88  Resp: (!) 24 (!) 27 (!) 23   Temp:       TempSrc:      SpO2: 97%  97%   Weight:      Height:        Physical Exam  Constitutional: He is oriented to person, place, and time. No distress.  HENT:  Head: Normocephalic.  Mouth/Throat: Oropharynx is clear and moist.  Eyes: Right eye exhibits no discharge. Left eye exhibits no discharge.  Neck: Neck supple.  Cardiovascular: Normal rate, regular rhythm and intact distal pulses.  Pulmonary/Chest: Effort normal and breath sounds normal. No respiratory distress. He has no wheezes. He has no rales.  Abdominal: Soft. Bowel sounds are normal. He exhibits no distension. There is abdominal tenderness. There is guarding. There is no rebound.  Generalized tenderness to minimal palpation with guarding, especially worse in the epigastric region  Musculoskeletal:        General: No edema.  Neurological: He is alert and oriented to person, place, and time.  Skin: Skin is warm and dry. He is not diaphoretic.     Labs on Admission: I have personally reviewed following labs and imaging studies  CBC: Recent Labs  Lab 12/31/18 2310  WBC 4.3  NEUTROABS 2.0  HGB 9.8*  HCT 30.2*  MCV 97.4  PLT 35*   Basic Metabolic Panel: Recent Labs  Lab 12/31/18 2310  NA 134*  K 3.1*  CL 102  CO2 21*  GLUCOSE 96  BUN 5*  CREATININE 0.56*  CALCIUM 9.0   GFR: Estimated Creatinine Clearance: 104.4 mL/min (A) (by C-G formula based on SCr of 0.56 mg/dL (L)). Liver Function Tests: Recent Labs  Lab 12/31/18 2310  AST 98*  ALT 23  ALKPHOS 170*  BILITOT 1.7*  PROT 7.8  ALBUMIN 3.3*   No results for input(s): LIPASE, AMYLASE in the last 168 hours. No results for input(s): AMMONIA in the last 168 hours. Coagulation Profile: Recent Labs  Lab 12/31/18 2310  INR 1.1   Cardiac Enzymes: No results for input(s): CKTOTAL, CKMB, CKMBINDEX, TROPONINI in the last 168 hours. BNP (last 3 results) No results for input(s): PROBNP in the last 8760 hours. HbA1C: No results for input(s): HGBA1C in  the last 72 hours. CBG: No results for input(s): GLUCAP in the last 168 hours. Lipid Profile: No results for input(s): CHOL, HDL, LDLCALC, TRIG, CHOLHDL, LDLDIRECT in the last 72 hours. Thyroid Function Tests: No results for input(s): TSH, T4TOTAL, FREET4, T3FREE, THYROIDAB in the last 72 hours. Anemia Panel: No results for input(s): VITAMINB12, FOLATE, FERRITIN, TIBC, IRON, RETICCTPCT in the last 72 hours. Urine analysis:    Component Value Date/Time   COLORURINE YELLOW 12/12/2018 2355   APPEARANCEUR CLEAR 12/12/2018 2355   LABSPEC 1.008 12/12/2018 2355   PHURINE 6.0 12/12/2018 2355   GLUCOSEU NEGATIVE 12/12/2018 2355   HGBUR NEGATIVE 12/12/2018 2355   BILIRUBINUR NEGATIVE 12/12/2018 2355   [place] 12/12/2018 2355   PROTEINUR NEGATIVE 12/12/2018 2355   NITRITE NEGATIVE 12/12/2018 2355   LEUKOCYTESUR NEGATIVE 12/12/2018 2355    Radiological Exams on Admission: No results found.  EKG: Independently reviewed.  Sinus rhythm, early repolarization abnormality, baseline wander.  Assessment/Plan Principal Problem:   Acute upper GI bleed Active Problems:   Thrombocytopenia ([facility])   Tobacco abuse   Hypokalemia   Alcohol dependence ([place])   Acute upper GI bleed Presenting with a 1 day history of melena.  Hemodynamically stable.  Hemoglobin 9.8, was 10.5 on 9/24. Dark tarry stool noted on rectal exam done in the ED.  Hemoccult positive. AST 98, alk phos 170, and T bili 1.7.  LFTs previously elevated as well and now improved compared to labs done 2 weeks ago.  INR 1.1. EGD done May 2020 showing findings concerning for Barrett's esophagus, esophagitis, esophageal varices, portal hypertensive gastropathy, erosive gastropathy, and duodenal ulcer.  No further episodes of melena since the patient has been in the ED.  Patient does have abdominal tenderness on minimal palpation with guarding on exam but appears comfortable at rest. -Monitor closely on telemetry -Type and screen  -Transfuse PRBCs if hemoglobin less than 7 or symptomatic -IV fluid hydration -IV PPI bolus and infusion -IV octreotide bolus and infusion -Keep n.p.o. -CT abdomen without contrast -Consult GI in a.m. -Monitor H&H -2 units platelets for worsening thrombocytopenia -Continue to monitor LFTs  Acute on chronic thrombocytopenia Platelet count 35,000, chronic thrombocytopenia now worse.  -2 units platelets -Continue to monitor platelet count  Mild hypokalemia Potassium 3.1.   -Replete potassium.  Check magnesium level and replete if low.  Continue to monitor electrolytes.  Alcohol dependence Patient reports drinking heavily on a daily basis.  He consumed alcohol before coming into the ED.  Blood ethanol level 316.  At high risk for complicated withdrawal during this hospitalization. -CIWA protocol; Ativan PRN -Thiamine, folate, multivitamin  Tobacco use -NicoDerm patch -Counseling  DVT prophylaxis: SCDs Code Status: Full code Family Communication: No family available. Disposition Plan: Anticipate discharge after clinical improvement. Consults called: None Admission status: It is my clinical opinion that referral for OBSERVATION is reasonable and necessary in this patient based on the above information provided. The aforementioned taken together are felt to place the patient at high risk for further clinical deterioration. However it is anticipated that the patient may be medically stable for discharge from the hospital within 24 to 48 hours.  The medical decision making on this patient was of high complexity and the patient is at high risk for clinical deterioration, therefore this is a level 3 visit.  Shela Leff MD [facility] Pager [phone]  If 7PM-7AM, please contact night-coverage [email or web address] Password [email or web address]  01/01/2019, 1:58 AM

## 2019-01-01 NOTE — H&P (View-Only) (Signed)
Referring Provider:  [facility]         Primary Care Physician:  Antony Blackbird, MD Primary Gastroenterologist: Justice Britain, MD            Reason for Consultation:  GI bleed                 ASSESSMENT /  PLAN    1. 53 yo male with Etoh abuse / hx of duodenal ulcer / GI bleed admitted with diffuse mid abdominal pain, dark heme positive stools on iron, dry heaves.  CT scan raises some concern for pancreatitis confusing the picture a little bit. Hgb drifting over last few weeks from 11 >>10 >> 9.8 >> 9.3 today.  -Will plan for EGD in am (was going to do today but SARS not resulted) -Has history of portal hypertensive gastropathy, has continued to imbibe and platelets only 34 so continue Octreotide gtt.   -Continue PPI gtt -Monitor hgb -Clear liquids today, NPO after midnight  2. Etoh abuse, on [place] Attending   I have taken an interval history, reviewed the chart and examined the patient. I agree with the Advanced Practitioner's note, impression and recommendations.     Given persistent and recurrent signs/sxs over months, imaging and lab abnormalities - I think EGD appropriate.  ? Persistent duodenal ulcer vs pancreatitis or both. I favor ulcer   Gatha Mayer, MD, [facility] 01/01/2019 11:18 AM Pager [phone]' HPI:     Barry Moore is a 53 y.o. male with a PMH significant for Etoh hepatitis, Etoh pancreatitis, cocaine use, duodenal ulcer, candida esophagitis, hiatal hernia, Schatzki ring, hemorrhoids, seizures, HTN, depression, iron deficiency anemia. Admitted in May and again in June of this year for GI bleeding.   Patient presented to ED yesterday with abdominal pain dark stools, heme + stool ( on iron). Stools normally dark on iron but were on the loose side last week. He has been drinking heavilty. Lipase was 32. CTA abd / pelvis - haziness and soft tissue stranding at the pancreaticoduodenal groove with soft tissue  thickening and small fluid in the right anterior pararenal space. He is a poor historian but described diffuse mid abdominal pain which started yesterday, resolved in an hour of so. He reports a couple of months of frequent dry heaves / "gagging". No hematemesis. He has been our of iron pills for a few days but is compliant with BID PPI, no NSAID  EGD May 2020 White nummular lesions in esophageal mucosa. Biopsied. - Grade I and small (< 5 mm) esophageal varices. - LA Grade B esophagitis. - Salmon-colored mucosa suspicious for short-segment Barrett's esophagus. - Portal hypertensive gastropathy. - Erosive gastropathy. Biopsied. - Duodenal ulcer with a clean ulcer base (Forrest Class III). - Nodular mucosa in the duodenal bulb. Biopsied. - No gross lesions in the second portion of the duodenum.  Path - Chronic gastritis, candida esophagitis, no Barrett's esophagua   Colonoscopy May 2020 Hemorrhoids found on perianal exam. - Two 2 to 3 mm polyps in the ascending colon and in the cecum, removed with a cold snare. Resected and retrieved. - A single (solitary) ulcer in the proximal ascending colon. This is of unclear clinical significance. Biopsied. - The entire examined colon is normal. Biopsied given the unexplained ascending colon ulcer. - The examined portion of the ileum was normal. - The examination was otherwise normal on direct and retroflexion views.  Diagnosis 1. Colon, polyp(s), cecal and distal ascending -  TUBULAR ADENOMA WITHOUT HIGH GRADE DYSPLASIA. 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 2. Colon, biopsy, proximal ascending ulcer - COLONIC MUCOSA WITH ISCHEMIC-TYPE CHANGES, CLINICALLY ULCER. 3. Colon, biopsy, random - COLONIC MUCOSA WITH NO SIGNIFICANT PATHOLOGIC FINDINGS. - NEGATIVE FOR ACTIVE INFLAMMATION AND OTHER ABNORMALITIES.  Past Medical History:  Diagnosis Date   Alcoholic hepatitis    Depression    ETOH abuse    Gastric bleed 08/2018     Hypertension    Pancreatitis    Seizures ([place])    Suicidal behavior     Past Surgical History:  Procedure Laterality Date   BIOPSY  08/05/2018   Procedure: BIOPSY;  Surgeon: Irving Copas., MD;  Location: [place];  Service: Gastroenterology;;   BIOPSY  08/07/2018   Procedure: BIOPSY;  Surgeon: Thornton Park, MD;  Location: [place];  Service: Gastroenterology;;   COLONOSCOPY WITH PROPOFOL N/A 08/07/2018   Procedure: COLONOSCOPY WITH PROPOFOL;  Surgeon: Thornton Park, MD;  Location: [place];  Service: Gastroenterology;  Laterality: N/A;   ESOPHAGOGASTRODUODENOSCOPY (EGD) WITH PROPOFOL N/A 11/02/2016   Procedure: ESOPHAGOGASTRODUODENOSCOPY (EGD) WITH PROPOFOL;  Surgeon: Carol Ada, MD;  Location: [facility];  Service: Endoscopy;  Laterality: N/A;   ESOPHAGOGASTRODUODENOSCOPY (EGD) WITH PROPOFOL N/A 08/05/2018   Procedure: ESOPHAGOGASTRODUODENOSCOPY (EGD) WITH PROPOFOL;  Surgeon: Rush Landmark Telford Nab., MD;  Location: [place];  Service: Gastroenterology;  Laterality: N/A;   HERNIA REPAIR     LEG SURGERY     POLYPECTOMY  08/07/2018   Procedure: POLYPECTOMY;  Surgeon: Thornton Park, MD;  Location: [place];  Service: Gastroenterology;;    Prior to Admission medications   Medication Sig Start Date End Date Taking? Authorizing Provider  pantoprazole (PROTONIX) 40 MG tablet Take 1 tablet (40 mg total) by mouth daily. 12/14/18 12/14/19 Yes Georgette Shell, MD  ferrous sulfate 325 (65 FE) MG tablet Take 1 tablet (325 mg total) by mouth daily with breakfast. Patient not taking: Reported on 09/23/2018 08/09/18 10/09/18  Elodia Florence., MD    Current Facility-Administered Medications  Medication Dose Route Frequency Provider Last Rate Last Dose   dextrose 5 %-0.9 % sodium chloride infusion   Intravenous Continuous Nita Sells, MD 100 mL/hr at [id number]     folic acid (FOLVITE) tablet 1 mg  1 mg Oral Daily Shela Leff, MD       LORazepam (ATIVAN) injection 0-4 mg  0-4 mg Intravenous Q6H Shela Leff, MD       Or   LORazepam (ATIVAN) tablet 0-4 mg  0-4 mg Oral Q6H Shela Leff, MD       [START ON 01/03/2019] LORazepam (ATIVAN) injection 0-4 mg  0-4 mg Intravenous Q12H Shela Leff, MD       Or   Derrill Memo ON 01/03/2019] LORazepam (ATIVAN) tablet 0-4 mg  0-4 mg Oral Q12H Shela Leff, MD       multivitamin with minerals tablet 1 tablet  1 tablet Oral Daily Shela Leff, MD       nicotine (NICODERM CQ - dosed in mg/24 hours) patch 21 mg  21 mg Transdermal Daily Shela Leff, MD   21 mg at 01/01/19 0904   octreotide (SANDOSTATIN) 500 mcg in sodium chloride 0.9 % 250 mL (2 mcg/mL) infusion  50 mcg/hr Intravenous Continuous Shela Leff, MD 25 mL/hr at 01/01/19 0329 50 mcg/hr at 01/01/19 0329   pantoprazole (PROTONIX) 80 mg in sodium chloride 0.9 % 250 mL (0.32 mg/mL) infusion  8 mg/hr Intravenous Continuous Shela Leff, MD 25 mL/hr at 01/01/19 0840  8 mg/hr at 01/01/19 0840   [START ON 01/04/2019] pantoprazole (PROTONIX) injection 40 mg  40 mg Intravenous Q12H Shela Leff, MD       thiamine (VITAMIN B-1) tablet 100 mg  100 mg Oral Daily Shela Leff, MD       Or   thiamine (B-1) injection 100 mg  100 mg Intravenous Daily Shela Leff, MD   100 mg at 01/01/19 0904    Allergies as of 12/31/2018 - Review Complete 12/31/2018  Allergen Reaction Noted   Tomato Shortness Of Breath and Nausea And Vomiting 02/28/2017   Aspirin Nausea And Vomiting 11/21/2015    Family History  Problem Relation Age of Onset   Diabetes Mother    Alcoholism Mother    Emphysema Father    Lung cancer Father    Alcoholism Father     Social History   Socioeconomic History   Marital status: Divorced    Spouse name: Not on file   Number of children: Not on file   Years of education: Not on file   Highest education level: Not on file    Occupational History   Not on file  Social Needs   Financial resource strain: Not on file   Food insecurity    Worry: Not on file    Inability: Not on file   Transportation needs    Medical: Not on file    Non-medical: Not on file  Tobacco Use   Smoking status: Current Every Day Smoker    Packs/day: 1.00    Types: Cigarettes   Smokeless tobacco: Never Used  Substance and Sexual Activity   Alcohol use: Yes    Comment: 5+ BEERS DAILY   Drug use: Not Currently    Frequency: 3.0 times per week    Types: Cocaine   Sexual activity: Not Currently  Lifestyle   Physical activity    Days per week: Not on file    Minutes per session: Not on file   Stress: Not on file  Relationships   Social connections    Talks on phone: Not on file    Gets together: Not on file    Attends religious service: Not on file    Active member of club or organization: Not on file    Attends meetings of clubs or organizations: Not on file    Relationship status: Not on file   Intimate partner violence    Fear of current or ex partner: Not on file    Emotionally abused: Not on file    Physically abused: Not on file    Forced sexual activity: Not on file  Other Topics Concern   Not on file  Social History Narrative   Not on file    Review of Systems: All systems reviewed and negative except where noted in HPI.  Physical Exam: Vital signs in last 24 hours: Temp:  [97.9 F (36.6 C)-98.6 F (37 C)] 98.6 F (37 C) (10/12 0858) Pulse Rate:  [67-97] 75 (10/12 0858) Resp:  [0-33] 18 (10/12 0858) BP: (106-143)/(63-83) 143/83 (10/12 0858) SpO2:  [94 %-99 %] 98 % (10/12 0858) Weight:  [69.9 kg-70 kg] 69.9 kg (10/11 2157)   General:   Alert, well-developed,  male in NAD Psych:   cooperative. Normal mood and affect. Eyes:  Pupils equal, sclera clear, no icterus.   Conjunctiva pink. Ears:  Normal auditory acuity. Nose:  No deformity, discharge,  or lesions. Neck:  Supple; no  masses Lungs:  Clear throughout to auscultation.  No wheezes, crackles, or rhonchi.  Heart:  Regular rate and rhythm; no murmurs, no lower extremity edema Abdomen:  Soft, non-distended, nontender, BS active, no palp mass   Rectal:  Deferred  Msk:  Symmetrical without gross deformities. . Neurologic:  Alert and  oriented x4;  grossly normal neurologically. Skin:  Intact without significant lesions or rashes.   Intake/Output from previous day: 10/11 0701 - 10/12 0700 In: 1496 [Blood:296; IV Piggyback:1200] Out: -  Intake/Output this shift: Total I/O In: 742 [I.V.:50; Blood:592; IV Piggyback:100] Out: -   Lab Results: Recent Labs    12/31/18 2310 01/01/19 0514  WBC 4.3 2.9*  HGB 9.8* 9.3*  HCT 30.2* 28.8*  PLT 35* 34*   BMET Recent Labs    12/31/18 2310 01/01/19 0514  NA 134* 138  K 3.1* 4.0  CL 102 107  CO2 21* 20*  GLUCOSE 96 67*  BUN 5* <5*  CREATININE 0.56* 0.57*  CALCIUM 9.0 8.1*   LFT Recent Labs    01/01/19 0514  PROT 6.8  ALBUMIN 3.1*  AST 86*  ALT 20  ALKPHOS 152*  BILITOT 1.7*  BILIDIR 0.6*  IBILI 1.1*   PT/INR Recent Labs    12/31/18 2310  LABPROT 14.5  INR 1.1   Hepatitis Panel No results for input(s): HEPBSAG, HCVAB, HEPAIGM, HEPBIGM in the last 72 hours.   . CBC Latest Ref Rng & Units 01/01/2019 12/31/2018 12/14/2018  WBC 4.0 - 10.5 K/uL 2.9(L) 4.3 3.4(L)  Hemoglobin 13.0 - 17.0 g/dL 9.3(L) 9.8(L) 10.5(L)  Hematocrit 39.0 - 52.0 % 28.8(L) 30.2(L) 31.9(L)  Platelets 150 - 400 K/uL 34(L) 35(L) 57(L)    . CMP Latest Ref Rng & Units 01/01/2019 12/31/2018 12/14/2018  Glucose 70 - 99 mg/dL 67(L) 96 78  BUN 6 - 20 mg/dL <5(L) 5(L) 7  Creatinine 0.61 - 1.24 mg/dL 0.57(L) 0.56(L) 0.60(L)  Sodium 135 - 145 mmol/L 138 134(L) 130(L)  Potassium 3.5 - 5.1 mmol/L 4.0 3.1(L) 4.6  Chloride 98 - 111 mmol/L 107 102 96(L)  CO2 22 - 32 mmol/L 20(L) 21(L) 24  Calcium 8.9 - 10.3 mg/dL 8.1(L) 9.0 8.5(L)  Total Protein 6.5 - 8.1 g/dL 6.8 7.8 7.2   Total Bilirubin 0.3 - 1.2 mg/dL 1.7(H) 1.7(H) 3.3(H)  Alkaline Phos 38 - 126 U/L 152(H) 170(H) 217(H)  AST 15 - 41 U/L 86(H) 98(H) 100(H)  ALT 0 - 44 U/L 20 23 22    Studies/Results: Ct Angio Abd/pel W/ And/or W/o  Result Date: 01/01/2019 CLINICAL DATA:  GI bleeding with abdominal pain EXAM: CTA ABDOMEN AND PELVIS wITHOUT AND WITH CONTRAST TECHNIQUE: Multidetector CT imaging of the abdomen and pelvis was performed using the standard protocol during bolus administration of intravenous contrast. Multiplanar reconstructed images and MIPs were obtained and reviewed to evaluate the vascular anatomy. CONTRAST:  128mL OMNIPAQUE IOHEXOL 350 MG/ML SOLN COMPARISON:  CT 12/13/2018, 11/04/2018, 05/24/2018 FINDINGS: VASCULAR Aorta: Non contrasted images demonstrate no acute intramural hematoma. Aorta is nonaneurysmal. No dissection is seen. Mild aortic atherosclerosis. No occlusion or stenosis. Celiac: Patent without evidence of aneurysm, dissection, vasculitis or significant stenosis. SMA: Patent without evidence of aneurysm, dissection, vasculitis or significant stenosis. Renals: Single right and single left renal arteries are patent. IMA: Patent without evidence of aneurysm, dissection, vasculitis or significant stenosis. Inflow: Patent without evidence of aneurysm, dissection, vasculitis or significant stenosis. Proximal Outflow: Bilateral common femoral and visualized portions of the superficial and profunda femoral arteries are patent without evidence of aneurysm, dissection, vasculitis or significant stenosis. Review of  the MIP images confirms the above findings. NON-VASCULAR Lower chest: Lung bases demonstrate no acute consolidation or effusion. The heart size is normal. Hepatobiliary: Hepatic steatosis. No focal hepatic abnormality. Similar appearance gallbladder wall thickening or edema. No calcified stone or biliary dilatation. Pancreas: No ductal dilatation. Indistinct haziness and soft tissue stranding at  the pancreaticoduodenal groove with soft tissue thickening and small fluid in the right anterior pararenal space. Spleen: Normal in size without focal abnormality. Adrenals/Urinary Tract: Adrenal glands are unremarkable. Kidneys are normal, without renal calculi, focal lesion, or hydronephrosis. Bladder is very distended Stomach/Bowel: The stomach is nonenlarged. No dilated small bowel. No colon wall thickening. No hyperdensity within the lumen of the bowel. The appendix is not well seen. Lymphatic: No increasing adenopathy. Reproductive: Prostate is unremarkable. Other: Negative for free air.  Hazy edema adjacent to the IVC. Musculoskeletal: Degenerative changes. No acute or suspicious osseous abnormality IMPRESSION: VASCULAR 1. Negative for acute extravasation into the bowel to suggest active GI bleeding at this time. 2. Mild aortic atherosclerosis without evidence for aneurysm, dissection, or significant occlusive disease. NON-VASCULAR 1. Redemonstrated edema and soft tissue stranding near the head and uncinate process of the pancreas and adjacent to the duodenum with fluid in the right anterior pararenal space, potentially representing pancreatitis and or duodenitis. The findings do not appear significantly changed since 12/13/2018. 2. Similar appearance of gallbladder wall edema or gallbladder wall thickening Electronically Signed   By: Donavan Foil M.D.   On: 01/01/2019 03:14    Principal Problem:   Acute upper GI bleed Active Problems:   Thrombocytopenia ([place])   Tobacco abuse   Hypokalemia   Alcohol dependence ([place])   Alcoholic liver disease ([place])    Tye Savoy, NP-C @  01/01/2019, 9:26 AM

## 2019-01-01 NOTE — ED Notes (Addendum)
Pt gone to CT 

## 2019-01-01 NOTE — Progress Notes (Signed)
Hospitalist progress note  Barry Moore  [id number] DOB: 08/09/1965 DOA: 12/31/2018 PCP: Antony Blackbird, MD  Narrative:  22 male EtOH GI bleed EGD colonoscopy portal gastropathy + peptic ulcer disease candidate grade 1 varices 6 mm ulcer bulb + polyps on 5/18 asked to 08/05/2018 prior pancreatitis EtOH seizures chronic thrombocytopenia SI/HI status post ED visit 11/09/2018  [facility] psych ED visits Admit1 day of dark stools-states formed 3 episodes-last one yesterday-noncompliant on PPI, not taking Goody's-rectal exam dark stool and Hemoccult was positive  Assessment & Plan: Upper GI bleed etiology unclear-known ulcers/polyps on last evaluation 07/2018-gastroenterology will see in consult n.p.o.-continue octreotide 25 mL/h, Protonix GTT 8/h await word from GI regarding scope-mild swelling pancreas on CT 10/12 however pancreatitis unlikely-lipase level low pain more any lower quadrant  EtOH related pancytopenia-given 1 packet of platelets-hold further Rx of the same secondary to no current bleed-repeat CBC a.m.-not hypotensive and hemodynamically stable for floor-recheck CBC a.m. transfusion threshold blood is below 7 if bleeding with thrombocytopenia may need further platelets INR is not significantly elevated at this time  Alcoholic liver disease/cirrhosis meld score 10-drinks 4 x "40's daily' and I suspect he drinks more-keep on CIWA currently 1-3  Hypoglycemia-CBG 67 change to D5 NS monitor trends at this time as is n.p.o.  Metabolic acidosis/hypokalemia on admission-blood alcohol level of 136 likely secondary to acidosis from alcoholism CO2 is 29 expect will improve-potassium is improved from 3.1-4.0 after 4 rounds therefore discontinue replacement  Suicidal ideation in the past not suicidal now needs outpatient monitoring with psychiatry   DVT prophylaxis: SCD  Code Status:   Presumed full   Family Communication:   None Disposition Plan: Inpatient  Consultants:   GI Procedures:    None Antimicrobials:   No Subjective: Awake alert coherent mild lower quadrant pain none in the upper Quadrant-no further dark or tarry stool since yesterday reports consistency with solid stool Has had no nausea no vomiting No fever no chills no lower extremity or abdominal swelling out of the ordinary1 Objective: Vitals:   01/01/19 0630 01/01/19 0647 01/01/19 0700 01/01/19 0710  BP: 109/63 109/63 119/74 123/78  Pulse: 83 83 86 84  Resp: (!) 21  19 20   Temp:    98.6 F (37 C)  TempSrc:    Oral  SpO2: 98%  99% 99%  Weight:      Height:        Intake/Output Summary (Last 24 hours) at 01/01/2019 0758 Last data filed at 01/01/2019 [id number] Gross per 24 hour  Intake 2138 ml  Output -  Net 2138 ml   Filed Weights   12/31/18 2153 12/31/18 2157  Weight: 70 kg 69.9 kg    Examination: EOMI NCAT disheveled very thick beard moderate dentition no icterus no pallor neck soft supple Chest clinically clear no added sound Abdomen soft flat tender in lower quadrants no rebound no guarding Rectal exam deferred Lower extremities are soft without swelling Psych flat affect congruent No lower extremity edema No asterixis or tremor at this time noted  Data Reviewed: I have personally reviewed following labs and imaging studies CBC: Recent Labs  Lab 12/31/18 2310 01/01/19 0514  WBC 4.3 2.9*  NEUTROABS 2.0  --   HGB 9.8* 9.3*  HCT 30.2* 28.8*  MCV 97.4 99.0  PLT 35* 34*   Basic Metabolic Panel: Recent Labs  Lab 12/31/18 2310 01/01/19 0514  NA 134* 138  K 3.1* 4.0  CL 102 107  CO2 21* 20*  GLUCOSE 96 67*  BUN 5* <  5*  CREATININE 0.56* 0.57*  CALCIUM 9.0 8.1*  MG  --  1.9   GFR: Estimated Creatinine Clearance: 104.4 mL/min (A) (by C-G formula based on SCr of 0.57 mg/dL (L)). Liver Function Tests: Recent Labs  Lab 12/31/18 2310 01/01/19 0514  AST 98* 86*  ALT 23 20  ALKPHOS 170* 152*  BILITOT 1.7* 1.7*  PROT 7.8 6.8  ALBUMIN 3.3* 3.1*   Recent Labs  Lab  01/01/19 0419  LIPASE 32   No results for input(s): AMMONIA in the last 168 hours. Coagulation Profile: Recent Labs  Lab 12/31/18 2310  INR 1.1   Cardiac Enzymes: Radiology Studies: Reviewed images personally in health database  Scheduled Meds: . folic acid  1 mg Oral Daily  . LORazepam  0-4 mg Intravenous Q6H   Or  . LORazepam  0-4 mg Oral Q6H  . [START ON 01/03/2019] LORazepam  0-4 mg Intravenous Q12H   Or  . [START ON 01/03/2019] LORazepam  0-4 mg Oral Q12H  . multivitamin with minerals  1 tablet Oral Daily  . nicotine  21 mg Transdermal Daily  . [START ON 01/04/2019] pantoprazole  40 mg Intravenous Q12H  . thiamine  100 mg Oral Daily   Or  . thiamine  100 mg Intravenous Daily   Continuous Infusions: . sodium chloride 125 mL/hr at 01/01/19 0137  . octreotide  (SANDOSTATIN)    IV infusion 50 mcg/hr (01/01/19 0329)  . pantoprozole (PROTONIX) infusion 8 mg/hr (12/31/18 2356)    LOS: 0 days   Time spent: [place], MD [facility]  If 7PM-7AM, please contact night-coverage-look on AMION to find my number otherwise-prefer pages-not epic chat,please 01/01/2019, 7:58 AM

## 2019-01-01 NOTE — Progress Notes (Signed)
Patient was transferred to [place] at [phone]. Alert and oriented x 4. Room is set up. Call light was within patient's reach. Vitals signs was taken. Report given at [phone]

## 2019-01-01 NOTE — Progress Notes (Signed)
BP= 143/83. Patient has history of ETOH abuse. CIWA checked q4hrs as ordered. Alert and oriented x 4.

## 2019-01-01 NOTE — TOC Initial Note (Signed)
Transition of Care [facility]) - Initial/Assessment Note    Patient Details  Name: Barry Moore MRN: [id number] Date of Birth: Apr 02, 1965  [facility] CM/SW Contact:    [name], LCSW Phone Number: 01/01/2019, 3:28 PM  Clinical Narrative:    Pt seen due to high risk for readmission.  States he is here due to blood in stool.  This has happened before.  Denies significant pain, was eating and drinking during my visit.  Got housing through housing authority about 6 months ago.  Gets food stamps, less than $200.00 per month.  He lives close to public transportation, but prefers to walk.  Says he generally gets groceries at the [employer]. Says he is waiting to hear about MCD eligibility, getting help through [place], case worker through  [employer].  No family supprts, but has a couple fo friends.  He will need help filling prescriptions if supplied when discharged.  Open to having appointment scheduled at [place].  TOC will continue to follow during course of hospitalization.              Expected Discharge Plan: Home/Self Care Barriers to Discharge: No Barriers Identified   Patient Goals and CMS Choice        Expected Discharge Plan and Services Expected Discharge Plan: Home/Self Care   Discharge Planning Services: CM Consult   Living arrangements for the past 2 months: Apartment                                      Prior Living Arrangements/Services Living arrangements for the past 2 months: Apartment   Patient language and need for interpreter reviewed:: Yes Do you feel safe going back to the place where you live?: Yes      Need for Family Participation in Patient Care: No (Comment) Care giver support system in place?: Yes (comment)   Criminal Activity/Legal Involvement Pertinent to Current Situation/Hospitalization: No - Comment as needed  Activities of Daily Living Home Assistive Devices/Equipment: None ADL Screening  (condition at time of admission) Patient's cognitive ability adequate to safely complete daily activities?: Yes Is the patient deaf or have difficulty hearing?: No Does the patient have difficulty seeing, even when wearing glasses/contacts?: No Does the patient have difficulty concentrating, remembering, or making decisions?: No Patient able to express need for assistance with ADLs?: Yes Does the patient have difficulty dressing or bathing?: No Independently performs ADLs?: Yes (appropriate for developmental age) Does the patient have difficulty walking or climbing stairs?: No Weakness of Legs: None Weakness of Arms/Hands: None  Permission Sought/Granted                  Emotional Assessment Appearance:: Appears stated age Attitude/Demeanor/Rapport: Engaged Affect (typically observed): Appropriate Orientation: : Oriented to Self, Oriented to Place, Oriented to  Time, Oriented to Situation Alcohol / Substance Use: Alcohol Use Psych Involvement: No (comment)  Admission diagnosis:  Alcohol abuse [F10.10] Upper GI bleed [K92.2] Patient Active Problem List   Diagnosis Date Noted  . Acute upper GI bleed 01/01/2019  . Hypokalemia 01/01/2019  . Alcohol dependence ([place]) 01/01/2019  . Alcoholic liver disease ([place]) 01/01/2019  . Acute pancreatitis 12/13/2018  . Free intraperitoneal air 12/13/2018  . Homicidal ideation   . Gastrointestinal hemorrhage with melena 08/31/2018  . Colon ulcer   . Polyp of ascending colon   . Acute GI bleeding 08/05/2018  .  Transaminitis 08/05/2018  . Abdominal pain 08/05/2018  . Nausea and vomiting 08/05/2018  . Substance induced mood disorder ([place]) 07/16/2017  . Severe recurrent major depression without psychotic features ([place]) 05/15/2017  . Anemia 03/18/2017  . Acute blood loss anemia 02/21/2017  . UGI bleed 02/08/2017  . Alcohol withdrawal ([place]) 02/08/2017  . Cocaine abuse ([place]) 11/18/2016  . GI bleed 11/01/2016  . Alcohol abuse   . Major  depressive disorder, recurrent severe without psychotic features ([place]) 07/06/2016  . Alcohol-induced mood disorder ([place]) 06/15/2016  . Seizure ([place]) 05/26/2016  . Tobacco abuse 05/26/2016  . Homeless 05/26/2016  . Essential hypertension 05/26/2016  . Alcohol dependence with alcohol-induced mood disorder ([place]) 02/14/2016  . Severe alcohol withdrawal without perceptual disturbances without complication ([place]) [id number]  . Alcoholic hepatitis without ascites 02/14/2016  . Thrombocytopenia ([place]) 02/14/2016   PCP:  Antony Blackbird, MD Pharmacy:   [place], [place] [address] [place] [address] Phone: [phone] Fax: [phone]  [place] ([place]), [place] - [address] [place] ([place] [address] Phone: [phone] Fax: [phone]     Social Determinants of Health ([place]) Interventions    Readmission Risk Interventions Readmission Risk Prevention Plan 09/01/2018 08/09/2018  Transportation Screening Complete Complete  PCP or Specialist Appt within 5-7 Days - Complete  Home Care Screening - Complete  Medication Review (RN CM) - Complete  Medication Review (RN Care Manager) Complete -  PCP or Specialist appointment within 3-5 days of discharge Complete -  [place] or Home Care Consult Complete -  SW Recovery Care/Counseling Consult Complete -  Palliative Care Screening Not Applicable -  [place] Not Applicable -  Some recent data might be hidden

## 2019-01-02 ENCOUNTER — Encounter (HOSPITAL_COMMUNITY)
Admission: EM | Disposition: A | Discharge status: Home / Self Care | Payer: Self-pay | Source: Home / Self Care | Attending: Family Medicine

## 2019-01-02 ENCOUNTER — Inpatient Hospital Stay (HOSPITAL_COMMUNITY): Payer: Self-pay | Admitting: Certified Registered"

## 2019-01-02 ENCOUNTER — Inpatient Hospital Stay: Payer: Self-pay | Admitting: Critical Care Medicine

## 2019-01-02 DIAGNOSIS — K2980 Duodenitis without bleeding: Secondary | ICD-10-CM

## 2019-01-02 HISTORY — PX: BIOPSY: SHX5522

## 2019-01-02 HISTORY — PX: ESOPHAGOGASTRODUODENOSCOPY: SHX5428

## 2019-01-02 LAB — CBC WITH DIFFERENTIAL/PLATELET
Abs Immature Granulocytes: 0.01 10*3/uL (ref 0.00–0.07)
Basophils Absolute: 0 10*3/uL (ref 0.0–0.1)
Basophils Relative: 1 %
Eosinophils Absolute: 0.1 10*3/uL (ref 0.0–0.5)
Eosinophils Relative: 3 %
HCT: 29.2 % — ABNORMAL LOW (ref 39.0–52.0)
Hemoglobin: 9.6 g/dL — ABNORMAL LOW (ref 13.0–17.0)
Immature Granulocytes: 0 %
Lymphocytes Relative: 23 %
Lymphs Abs: 0.8 10*3/uL (ref 0.7–4.0)
MCH: 32.3 pg (ref 26.0–34.0)
MCHC: 32.9 g/dL (ref 30.0–36.0)
MCV: 98.3 fL (ref 80.0–100.0)
Monocytes Absolute: 0.5 10*3/uL (ref 0.1–1.0)
Monocytes Relative: 14 %
Neutro Abs: 2 10*3/uL (ref 1.7–7.7)
Neutrophils Relative %: 59 %
Platelets: 48 10*3/uL — ABNORMAL LOW (ref 150–400)
RBC: 2.97 MIL/uL — ABNORMAL LOW (ref 4.22–5.81)
RDW: 13.7 % (ref 11.5–15.5)
WBC: 3.4 10*3/uL — ABNORMAL LOW (ref 4.0–10.5)
nRBC: 0 % (ref 0.0–0.2)

## 2019-01-02 LAB — GLUCOSE, CAPILLARY
Glucose-Capillary: 128 mg/dL — ABNORMAL HIGH (ref 70–99)
Glucose-Capillary: 139 mg/dL — ABNORMAL HIGH (ref 70–99)
Glucose-Capillary: 144 mg/dL — ABNORMAL HIGH (ref 70–99)

## 2019-01-02 LAB — PREPARE PLATELET PHERESIS: Unit division: 0

## 2019-01-02 LAB — BASIC METABOLIC PANEL
Anion gap: 8 (ref 5–15)
BUN: <5 mg/dL — ABNORMAL LOW (ref 6–20)
CO2: 23 mmol/L (ref 22–32)
Calcium: 8 mg/dL — ABNORMAL LOW (ref 8.9–10.3)
Chloride: 99 mmol/L (ref 98–111)
Creatinine, Ser: 0.59 mg/dL — ABNORMAL LOW (ref 0.61–1.24)
GFR calc Af Amer: >60 mL/min (ref >60)
GFR calc non Af Amer: >60 mL/min (ref >60)
Glucose, Bld: 170 mg/dL — ABNORMAL HIGH (ref 70–99)
Potassium: 3.3 mmol/L — ABNORMAL LOW (ref 3.5–5.1)
Sodium: 130 mmol/L — ABNORMAL LOW (ref 135–145)

## 2019-01-02 LAB — BPAM PLATELET PHERESIS
Blood Product Expiration Date: 202010122359
ISSUE DATE / TIME: 202010120557
Unit Type and Rh: 5100

## 2019-01-02 SURGERY — EGD (ESOPHAGOGASTRODUODENOSCOPY)
Anesthesia: Monitor Anesthesia Care

## 2019-01-02 MED ORDER — ACETAMINOPHEN 325 MG PO TABS
650.0000 mg | ORAL_TABLET | Freq: Four times a day (QID) | ORAL | Status: DC | PRN
  Administered 2019-01-02: 21:00:00 650 mg via ORAL
  Filled 2019-01-02: qty 2

## 2019-01-02 MED ORDER — POTASSIUM CHLORIDE CRYS ER 20 MEQ PO TBCR
40.0000 meq | EXTENDED_RELEASE_TABLET | Freq: Every day | ORAL | Status: DC
  Administered 2019-01-02 – 2019-01-03 (×2): 40 meq via ORAL
  Filled 2019-01-02 (×2): qty 2

## 2019-01-02 MED ORDER — PROPOFOL 10 MG/ML IV BOLUS
INTRAVENOUS | Status: DC | PRN
  Administered 2019-01-02: 20 mg via INTRAVENOUS
  Administered 2019-01-02: 10 mg via INTRAVENOUS

## 2019-01-02 MED ORDER — PROPOFOL 500 MG/50ML IV EMUL
INTRAVENOUS | Status: AC
  Filled 2019-01-02: qty 50

## 2019-01-02 MED ORDER — LIDOCAINE 2% (20 MG/ML) 5 ML SYRINGE
INTRAMUSCULAR | Status: DC | PRN
  Administered 2019-01-02: 60 mg via INTRAVENOUS

## 2019-01-02 MED ORDER — PANTOPRAZOLE SODIUM 40 MG PO TBEC
40.0000 mg | DELAYED_RELEASE_TABLET | Freq: Two times a day (BID) | ORAL | Status: DC
  Administered 2019-01-02 – 2019-01-03 (×3): 40 mg via ORAL
  Filled 2019-01-02 (×3): qty 1

## 2019-01-02 MED ORDER — PROPOFOL 500 MG/50ML IV EMUL
INTRAVENOUS | Status: DC | PRN
  Administered 2019-01-02: 135 ug/kg/min via INTRAVENOUS

## 2019-01-02 MED ORDER — SODIUM CHLORIDE 0.9 % IV SOLN
INTRAVENOUS | Status: DC
  Administered 2019-01-02: 07:00:00 via INTRAVENOUS

## 2019-01-02 NOTE — Interval H&P Note (Signed)
History and Physical Interval Note:  01/02/2019 7:16 AM  Barry Moore  has presented today for surgery, with the diagnosis of abdominal pain, anemia, FOBT+.  The various methods of treatment have been discussed with the patient and family. After consideration of risks, benefits and other options for treatment, the patient has consented to  Procedure(s): ESOPHAGOGASTRODUODENOSCOPY (EGD) (N/A) as a surgical intervention.  The patient's history has been reviewed, patient examined, no change in status, stable for surgery.  I have reviewed the patient's chart and labs.  Questions were answered to the patient's satisfaction.     Silvano Rusk

## 2019-01-02 NOTE — Op Note (Signed)
[facility] Patient Name: Barry Moore Procedure Date: 01/02/2019 MRN: [id number] Attending MD: Gatha Mayer , MD Date of Birth: [birth year] CSN: [id number] Age: 53 Admit Type: Inpatient Procedure:                Upper GI endoscopy Indications:              Epigastric abdominal pain, Heme positive stool,                            Abnormal CT of the GI tract Providers:                Gatha Mayer, MD, Cleda Daub, RN, Elspeth Cho Tech., Technician, Theressa Millard, CRNA Referring MD:              Medicines:                Propofol per Anesthesia, Monitored Anesthesia Care Complications:            No immediate complications. Estimated Blood Loss:     Estimated blood loss was minimal. Procedure:                Pre-Anesthesia Assessment:                           - Prior to the procedure, a History and Physical                            was performed, and patient medications and                            allergies were reviewed. The patient's tolerance of                            previous anesthesia was also reviewed. The risks                            and benefits of the procedure and the sedation                            options and risks were discussed with the patient.                            All questions were answered, and informed consent                            was obtained. Prior Anticoagulants: The patient has                            taken no previous anticoagulant or antiplatelet                            agents. ASA Grade Assessment: III - A patient with  severe systemic disease. After reviewing the risks                            and benefits, the patient was deemed in                            satisfactory condition to undergo the procedure.                           After obtaining informed consent, the endoscope was                            passed under direct vision.  Throughout the                            procedure, the patient's blood pressure, pulse, and                            oxygen saturations were monitored continuously. The                            [id number]) Olympus gastroscope was                            introduced through the mouth, and advanced to the                            second part of duodenum. The upper GI endoscopy was                            accomplished without difficulty. The patient                            tolerated the procedure well. Scope In: Scope Out: Findings:      A single localized erosion without bleeding was found in the second       portion of the duodenum. Biopsies were taken with a cold forceps for       histology. Verification of patient identification for the specimen was       done. Estimated blood loss was minimal.      Localized moderate mucosal changes characterized by congestion and       erythema were found in the second portion of the duodenum. Biopsies were       taken with a cold forceps for histology. Verification of patient       identification for the specimen was done. Estimated blood loss was       minimal.      A single area of ectopic gastric mucosa was found in the distal       esophagus.      The exam was otherwise without abnormality.      The cardia and gastric fundus were normal on retroflexion. Impression:               - Duodenal erosion without bleeding. Biopsied.                           -  Mucosal changes in the duodenum. Biopsied.                           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                            previously bxed and no intestinal metaplasia                           - The examination was otherwise normal.                           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 Recommendation:           - Return patient to hospital ward for ongoing care.                           - Change PPI to po - bid for now could go home on qd                           Stop octreotide - no signs of portal htn/varices                           F/U path                           Low fat diet Procedure Code(s):        --- Professional ---                           [phone], Esophagogastroduodenoscopy, flexible,                            transoral; with biopsy, single or multiple Diagnosis Code(s):        --- Professional ---                           K26.9, Duodenal ulcer, unspecified as acute or                            chronic, without hemorrhage or perforation  K31.89, Other diseases of stomach and duodenum                           Q40.2, Other specified congenital malformations of                            stomach                           R10.13, Epigastric pain                           R19.5, Other fecal abnormalities                           R93.3, Abnormal findings on diagnostic imaging of                            other parts of digestive tract CPT copyright 2019 American Medical Association. All rights reserved. The codes documented in this report are preliminary and upon coder review may  be revised to meet current compliance requirements. Gatha Mayer, MD 01/02/2019 7:51:49 AM This report has been signed electronically. Number of Addenda: 0

## 2019-01-02 NOTE — Progress Notes (Signed)
Hospitalist progress note  Barry Moore  [id number] DOB: 1965/05/07 DOA: 12/31/2018 PCP: Antony Blackbird, MD  Narrative:  1 male EtOH GI bleed EGD colonoscopy portal gastropathy + peptic ulcer disease candidate grade 1 varices 6 mm ulcer bulb + polyps on 5/18 asked to 08/05/2018 prior pancreatitis EtOH seizures chronic thrombocytopenia SI/HI status post ED visit 11/09/2018  [facility] psych ED visits Admit 1 day of dark stools-states formed 3 episodes-last one yesterday-noncompliant on PPI, not taking Goody's-rectal exam dark stool and Hemoccult was positive  Assessment & Plan: Upper GI bleed etiology unclear-known ulcers/polyps on last evaluation 07/2018-EGD 10/13 = small erosion with moderate mucosal congestion erythema  octreotide 25 mL/h DC'd, Protonix GTT-->p.o. -mild swelling pancreas on CT 10/12 may be a subclinical pancreatitis Okay for heart healthy diet  EtOH related pancytopenia-given 1 packet of platelets-platelets improved currently 48 hemoglobin stable 9  Alcoholic liver disease/cirrhosis meld score 10-drinks 4 x "40's daily' and I suspect he drinks more-keep on CIWA ranging between 3 and 11 overnight monitor  Hypoglycemia-CBG improved-stop saline as well slightly hypo-natremia secondary to fluids  Metabolic acidosis/hypokalemia on admission-acidosis secondary to BAL 136-improved-potassium slightly low-give potassium pills  Suicidal ideation in the past not suicidal now needs outpatient monitoring with psychiatry   DVT prophylaxis: SCD  Code Status:   Presumed full   Family Communication:   None Disposition Plan: Inpatient  Consultants:   GI Procedures:   None Antimicrobials:   No Subjective: Feels well no apparent distress at this time no chest pain-normal colored stools-no vomiting-no fever  Objective: Vitals:   01/02/19 0427 01/02/19 0716 01/02/19 0750 01/02/19 0800  BP: (!) 143/89 (!) 159/77 (!) 141/80 (!) 144/78  Pulse: 75 76 84 78  Resp: 19 (!) 22 18 (!) 24   Temp: 99.2 F (37.3 C) 98.8 F (37.1 C) 98.6 F (37 C)   TempSrc:   Temporal   SpO2: 97% 98% 100% 98%  Weight:      Height:        Intake/Output Summary (Last 24 hours) at 01/02/2019 1159 Last data filed at 01/02/2019 0750 Gross per 24 hour  Intake 3606.17 ml  Output 1450 ml  Net 2156.17 ml   Filed Weights   12/31/18 2153 12/31/18 2157  Weight: 70 kg 69.9 kg    Examination:  EOMI NCAT thick beard no distress Chest clinically clear no added sound no rales no rhonchi Abdomen obese but nontender nondistended no rebound no guarding No lower extremity edema Neurologically intact moving all 4 limbs equally Range of motion intact   Data Reviewed: I have personally reviewed following labs and imaging studies CBC: Recent Labs  Lab 12/31/18 2310 01/01/19 0514 01/02/19 0539  WBC 4.3 2.9* 3.4*  NEUTROABS 2.0  --  2.0  HGB 9.8* 9.3* 9.6*  HCT 30.2* 28.8* 29.2*  MCV 97.4 99.0 98.3  PLT 35* 34* 48*   Basic Metabolic Panel: Recent Labs  Lab 12/31/18 2310 01/01/19 0514 01/02/19 0539  NA 134* 138 130*  K 3.1* 4.0 3.3*  CL 102 107 99  CO2 21* 20* 23  GLUCOSE 96 67* 170*  BUN 5* <5* <5*  CREATININE 0.56* 0.57* 0.59*  CALCIUM 9.0 8.1* 8.0*  MG  --  1.9  --    GFR: Estimated Creatinine Clearance: 104.4 mL/min (A) (by C-G formula based on SCr of 0.59 mg/dL (L)). Liver Function Tests: Recent Labs  Lab 12/31/18 2310 01/01/19 0514  AST 98* 86*  ALT 23 20  ALKPHOS 170* 152*  BILITOT 1.7* 1.7*  PROT 7.8 6.8  ALBUMIN 3.3* 3.1*   Recent Labs  Lab 01/01/19 0419  LIPASE 32   No results for input(s): AMMONIA in the last 168 hours. Coagulation Profile: Recent Labs  Lab 12/31/18 2310  INR 1.1   Cardiac Enzymes: Radiology Studies: Reviewed images personally in health database  Scheduled Meds: . folic acid  1 mg Oral Daily  . LORazepam  0-4 mg Intravenous Q6H   Or  . LORazepam  0-4 mg Oral Q6H  . [START ON 01/03/2019] LORazepam  0-4 mg Intravenous Q12H    Or  . [START ON 01/03/2019] LORazepam  0-4 mg Oral Q12H  . multivitamin with minerals  1 tablet Oral Daily  . nicotine  21 mg Transdermal Daily  . pantoprazole  40 mg Oral BID AC  . thiamine  100 mg Oral Daily   Or  . thiamine  100 mg Intravenous Daily   Continuous Infusions: . dextrose 5 % and 0.9% NaCl 100 mL/hr at 01/02/19 0420    LOS: 1 day   Time spent: [place], MD [facility]  If 7PM-7AM, please contact night-coverage-look on AMION to find my number otherwise-prefer pages-not epic chat,please 01/02/2019, 11:59 AM

## 2019-01-02 NOTE — Transfer of Care (Signed)
Immediate Anesthesia Transfer of Care Note  Patient: Barry Moore  Procedure(s) Performed: ESOPHAGOGASTRODUODENOSCOPY (EGD) (N/A ) BIOPSY  Patient Location: PACU  Anesthesia Type:MAC  Level of Consciousness: awake, alert  and oriented  Airway & Oxygen Therapy: Patient Spontanous Breathing and Patient connected to face mask oxygen  Post-op Assessment: Report given to RN, Post -op Vital signs reviewed and stable and Patient moving all extremities X 4  Post vital signs: Reviewed and stable  Last Vitals:  Vitals Value Taken Time  BP    Temp    Pulse 84 01/02/19 0749  Resp 18 01/02/19 0749  SpO2 100 % 01/02/19 0749  Vitals shown include unvalidated device data.  Last Pain:  Vitals:   01/02/19 0716  TempSrc:   PainSc: 0-No pain         Complications: No apparent anesthesia complications

## 2019-01-02 NOTE — Progress Notes (Deleted)
   Subjective:    Patient ID: Barry Moore, male    DOB: 1965-08-23, 52 y.o.   MRN: [id number]  HPI    Review of Systems     Objective:   Physical Exam        Assessment & Plan:

## 2019-01-02 NOTE — Anesthesia Preprocedure Evaluation (Signed)
Anesthesia Evaluation  Patient identified by MRN, date of birth, ID band Patient awake    Reviewed: Allergy & Precautions, H&P , NPO status , Patient's Chart, lab work & pertinent test results  Airway Mallampati: II   Neck ROM: full    Dental   Pulmonary Current Smoker,    breath sounds clear to auscultation       Cardiovascular hypertension,  Rhythm:regular Rate:Normal     Neuro/Psych Seizures -,  PSYCHIATRIC DISORDERS Depression    GI/Hepatic PUD, (+)     substance abuse  alcohol use,   Endo/Other    Renal/GU      Musculoskeletal   Abdominal   Peds  Hematology  (+) Blood dyscrasia, anemia ,   Anesthesia Other Findings   Reproductive/Obstetrics                             Anesthesia Physical Anesthesia Plan  ASA: III  Anesthesia Plan: MAC   Post-op Pain Management:    Induction: Intravenous  PONV Risk Score and Plan: 0 and Propofol infusion and Treatment may vary due to age or medical condition  Airway Management Planned: Nasal Cannula  Additional Equipment:   Intra-op Plan:   Post-operative Plan:   Informed Consent: I have reviewed the patients History and Physical, chart, labs and discussed the procedure including the risks, benefits and alternatives for the proposed anesthesia with the patient or authorized representative who has indicated his/her understanding and acceptance.       Plan Discussed with: CRNA, Anesthesiologist and Surgeon  Anesthesia Plan Comments:         Anesthesia Quick Evaluation

## 2019-01-02 NOTE — Anesthesia Procedure Notes (Signed)
Procedure Name: MAC Date/Time: 01/02/2019 7:33 AM Performed by: Niel Hummer, CRNA Pre-anesthesia Checklist: Patient identified, Emergency Drugs available, Patient being monitored and Suction available Patient Re-evaluated:Patient Re-evaluated prior to induction Oxygen Delivery Method: Simple face mask

## 2019-01-03 DIAGNOSIS — K922 Gastrointestinal hemorrhage, unspecified: Secondary | ICD-10-CM

## 2019-01-03 DIAGNOSIS — F1010 Alcohol abuse, uncomplicated: Secondary | ICD-10-CM

## 2019-01-03 DIAGNOSIS — K852 Alcohol induced acute pancreatitis without necrosis or infection: Secondary | ICD-10-CM

## 2019-01-03 DIAGNOSIS — Z720 Tobacco use: Secondary | ICD-10-CM

## 2019-01-03 DIAGNOSIS — K709 Alcoholic liver disease, unspecified: Secondary | ICD-10-CM

## 2019-01-03 DIAGNOSIS — D696 Thrombocytopenia, unspecified: Secondary | ICD-10-CM

## 2019-01-03 LAB — SURGICAL PATHOLOGY

## 2019-01-03 LAB — GLUCOSE, CAPILLARY
Glucose-Capillary: 103 mg/dL — ABNORMAL HIGH (ref 70–99)
Glucose-Capillary: 118 mg/dL — ABNORMAL HIGH (ref 70–99)

## 2019-01-03 MED ORDER — FOLIC ACID 1 MG PO TABS: 1.0000 mg | ORAL_TABLET | Freq: Every day | ORAL | 0 refills | Status: DC

## 2019-01-03 MED ORDER — THIAMINE HCL 100 MG PO TABS: 100.0000 mg | ORAL_TABLET | Freq: Every day | ORAL | 0 refills | Status: DC

## 2019-01-03 MED ORDER — ADULT MULTIVITAMIN W/MINERALS CH: 1.0000 | ORAL_TABLET | Freq: Every day | ORAL | 0 refills | Status: DC

## 2019-01-03 MED ORDER — PANTOPRAZOLE SODIUM 40 MG PO TBEC: 40.0000 mg | DELAYED_RELEASE_TABLET | Freq: Every day | ORAL | 1 refills | Status: DC

## 2019-01-03 NOTE — Discharge Summary (Signed)
Tremont Reindel SZ:[id number] DOB: 07-17-1965 DOA: 12/31/2018  PCP: Antony Blackbird, MD  Admit date: 12/31/2018 Discharge date: 01/03/2019  Admitted From: Home Disposition: Home  Recommendations for Outpatient Follow-up:  1. Follow up with PCP in 1-2 weeks.  She has arranged outpatient follow-up 2.   Home Health: None  Equipment/Devices: None  Discharge Condition: Stable CODE STATUS: Full Diet recommendation: Heart Healthy    Brief/Interim Summary: History of present illness:  Barry Moore is a 53 y.o. year old male with medical history significant for Etoh hepatitis, Etoh pancreatitis, cocaine use, duodenal ulcer (May 2020), candida esophagitis, hiatal hernia, Schatzki ring, hemorrhoids, seizures, HTN, depression, iron deficiency anemia, alcohol abuse who presented on 12/31/2018 with complaints of worsening abdominal pain x 1 day, dark stool (on iron), and frequent episodes of dry heaves without hematemesis.  Found to have hemoglobin of 9.5 on arrival, hemoglobin a few weeks ago cultures were globin, and platelet count of 35,000 blood alcohol level of 316. Remaining hospital course addressed in problem based format below:   Hospital Course:   Acute on chronic blood loss anemia.  History of erosive gastropathy, alcohol abuse, and previous duodenal ulcer in the setting of a slow down drift of hemoglobin concerning for potential upper GI bleed.  Supported initially with octreotide VI fusion.  EGD with IV octreotide due to history of esophageal varices, worsening platelet count, though no episode of hematemesis.  Underwent EGD by GI due to history of erosive gastropathy, alcohol abuse, previous duodenal ulcer in setting of slow down drift of hemoglobin.  EGD showed chronic duodenal ulcer erosions that looked better than prior according to GI.  Hemoglobin remained stable at 9.6.  On discharge patient will need to continue PPI daily, close follow-up with GI arranged.  Abdominal pain, suspect  related to mild pancreatitis.  CT abdomen showed peripancreatic fat stranding suspicious for acute pancreatitis.  Giving nonacute EGD presumably pain most like related to pancreatitis.  Resolution in pain with supportive care prior to discharge was able to tolerate oral diet  Acute on chronic thrombocytopenia.  Nadir of 35 on admission, 48 on discharge.  No active signs or symptoms of bleeding.  Related to alcoholic liver disease.  Ethanol abuse.  Still drinks three 40 ounce beer bottles daily.  Understands need for cessation, has cut back (previously was drinking  Six 40 ounce beers daily) alcohol cessation strongly emphasized prior to discharge.  Despite heavy drinking and alcohol level of 316 on admission no active signs of withdrawal prior to discharge.  Provided multivitamin, thiamine, folic acid supplementation.   Consultations:  GI  Procedures/Studies: EGD, 10/13 Subjective: No belly pain Having normal bowel movements Discharge Exam: Vitals:   01/03/19 0517 01/03/19 1145  BP: 140/79   Pulse: 85   Resp: 18   Temp: 98.6 F (37 C)   SpO2: 94% 97%   Vitals:   01/02/19 1827 01/02/19 2105 01/03/19 0517 01/03/19 1145  BP: (!) 151/88 (!) 142/82 140/79   Pulse: 87 73 85   Resp: 18 18 18    Temp: 99.7 F (37.6 C) 98.1 F (36.7 C) 98.6 F (37 C)   TempSrc: Oral Oral Oral   SpO2: 97% 95% 94% 97%  Weight:      Height:        General: Lying in bed, no apparent distress Eyes: EOMI, anicteric ENT: Oral Mucosa clear and moist Cardiovascular: regular rate and rhythm, no murmurs, rubs or gallops, no edema, Respiratory: Normal respiratory effort, lungs clear to auscultation bilaterally Abdomen: soft,  non-distended, non-tender, normal bowel sounds Skin: No Rash Neurologic: Grossly no focal neuro deficit.Mental status AAOx3, speech normal, Psychiatric:Appropriate affect, and mood  Discharge Diagnoses:  Principal Problem:   Acute upper GI bleed Active Problems:    Thrombocytopenia (HCC)   Tobacco abuse   Hypokalemia   Alcohol dependence ([place])   Alcoholic liver disease ([place])   Duodenitis    Discharge Instructions  Discharge Instructions    Diet - low sodium heart healthy   Complete by: As directed    Increase activity slowly   Complete by: As directed      Allergies as of 01/03/2019      Reactions   Tomato Shortness Of Breath, Nausea And Vomiting   Aspirin Nausea And Vomiting      Medication List    TAKE these medications   folic acid 1 MG tablet Commonly known as: FOLVITE Take 1 tablet (1 mg total) by mouth daily. Start taking on: January 04, 2019   multivitamin with minerals Tabs tablet Take 1 tablet by mouth daily. Start taking on: January 04, 2019   pantoprazole 40 MG tablet Commonly known as: Protonix Take 1 tablet (40 mg total) by mouth daily.   thiamine 100 MG tablet Take 1 tablet (100 mg total) by mouth daily. Start taking on: January 04, 2019      Follow-up Information    Willia Craze, NP Follow up on 01/17/2019.   Specialty: Gastroenterology Why: at 9:30 am. Please arrive 10 minutes early Contact information: [place] [address] [phone]        [facility] Follow up on 02/01/2019.   Why: Thursday at 3:10 for your hospital follow up appointment. Contact information: [place] [phone]         Allergies  Allergen Reactions  . Tomato Shortness Of Breath and Nausea And Vomiting  . Aspirin Nausea And Vomiting        The results of significant diagnostics from this hospitalization (including imaging, microbiology, ancillary and laboratory) are listed below for reference.     Microbiology: Recent Results (from the past 240 hour(s))  SARS CORONAVIRUS 2 (TAT 6-24 HRS) Nasopharyngeal Nasopharyngeal Swab     Status: None   Collection Time: 01/01/19 12:37 AM   Specimen: Nasopharyngeal Swab   Result Value Ref Range Status   SARS Coronavirus 2 NEGATIVE NEGATIVE Final    Comment: (NOTE) SARS-CoV-2 target nucleic acids are NOT DETECTED. The SARS-CoV-2 RNA is generally detectable in upper and lower respiratory specimens during the acute phase of infection. Negative results do not preclude SARS-CoV-2 infection, do not rule out co-infections with other pathogens, and should not be used as the sole basis for treatment or other patient management decisions. Negative results must be combined with clinical observations, patient history, and epidemiological information. The expected result is Negative. Fact Sheet for Patients: SugarRoll.be Fact Sheet for Healthcare Providers: https://www.woods-mathews.com/ This test is not yet approved or cleared by the [place] FDA and  has been authorized for detection and/or diagnosis of SARS-CoV-2 by FDA under an Emergency Use Authorization (EUA). This EUA will remain  in effect (meaning this test can be used) for the duration of the COVID-19 declaration under Section 56 4(b)(1) of the Act, 21 U.S.C. section 360bbb-3(b)(1), unless the authorization is terminated or revoked sooner. Performed at [facility], [place] [address], [place],  [address]      Labs: BNP (last 3 results) No  results for input(s): BNP in the last 8760 hours. Basic Metabolic Panel: Recent Labs  Lab 12/31/18 2310 01/01/19 0514 01/02/19 0539  NA 134* 138 130*  K 3.1* 4.0 3.3*  CL 102 107 99  CO2 21* 20* 23  GLUCOSE 96 67* 170*  BUN 5* <5* <5*  CREATININE 0.56* 0.57* 0.59*  CALCIUM 9.0 8.1* 8.0*  MG  --  1.9  --    Liver Function Tests: Recent Labs  Lab 12/31/18 2310 01/01/19 0514  AST 98* 86*  ALT 23 20  ALKPHOS 170* 152*  BILITOT 1.7* 1.7*  PROT 7.8 6.8  ALBUMIN 3.3* 3.1*   Recent Labs  Lab 01/01/19 0419  LIPASE 32   No results for input(s): AMMONIA in the last 168 hours. CBC: Recent  Labs  Lab 12/31/18 2310 01/01/19 0514 01/02/19 0539  WBC 4.3 2.9* 3.4*  NEUTROABS 2.0  --  2.0  HGB 9.8* 9.3* 9.6*  HCT 30.2* 28.8* 29.2*  MCV 97.4 99.0 98.3  PLT 35* 34* 48*   Cardiac Enzymes: No results for input(s): CKTOTAL, CKMB, CKMBINDEX, TROPONINI in the last 168 hours. BNP: Invalid input(s): POCBNP CBG: Recent Labs  Lab 01/02/19 0000 01/02/19 0835 01/02/19 1610 01/03/19 0005 01/03/19 0808  GLUCAP 139* 128* 144* 118* 103*   D-Dimer No results for input(s): DDIMER in the last 72 hours. Hgb A1c No results for input(s): HGBA1C in the last 72 hours. Lipid Profile No results for input(s): CHOL, HDL, LDLCALC, TRIG, CHOLHDL, LDLDIRECT in the last 72 hours. Thyroid function studies No results for input(s): TSH, T4TOTAL, T3FREE, THYROIDAB in the last 72 hours.  Invalid input(s): FREET3 Anemia work up No results for input(s): VITAMINB12, FOLATE, FERRITIN, TIBC, IRON, RETICCTPCT in the last 72 hours. Urinalysis    Component Value Date/Time   COLORURINE YELLOW 12/12/2018 2355   APPEARANCEUR CLEAR 12/12/2018 2355   LABSPEC 1.008 12/12/2018 2355   PHURINE 6.0 12/12/2018 2355   GLUCOSEU NEGATIVE 12/12/2018 2355   HGBUR NEGATIVE 12/12/2018 2355   BILIRUBINUR NEGATIVE 12/12/2018 2355   KETONESUR NEGATIVE 12/12/2018 2355   PROTEINUR NEGATIVE 12/12/2018 2355   NITRITE NEGATIVE 12/12/2018 2355   LEUKOCYTESUR NEGATIVE 12/12/2018 2355   Sepsis Labs Invalid input(s): PROCALCITONIN,  WBC,  LACTICIDVEN Microbiology Recent Results (from the past 240 hour(s))  SARS CORONAVIRUS 2 (TAT 6-24 HRS) Nasopharyngeal Nasopharyngeal Swab     Status: None   Collection Time: 01/01/19 12:37 AM   Specimen: Nasopharyngeal Swab  Result Value Ref Range Status   SARS Coronavirus 2 NEGATIVE NEGATIVE Final    Comment: (NOTE) SARS-CoV-2 target nucleic acids are NOT DETECTED. The SARS-CoV-2 RNA is generally detectable in upper and lower respiratory specimens during the acute phase of  infection. Negative results do not preclude SARS-CoV-2 infection, do not rule out co-infections with other pathogens, and should not be used as the sole basis for treatment or other patient management decisions. Negative results must be combined with clinical observations, patient history, and epidemiological information. The expected result is Negative. Fact Sheet for Patients: SugarRoll.be Fact Sheet for Healthcare Providers: https://www.woods-mathews.com/ This test is not yet approved or cleared by the [place] FDA and  has been authorized for detection and/or diagnosis of SARS-CoV-2 by FDA under an Emergency Use Authorization (EUA). This EUA will remain  in effect (meaning this test can be used) for the duration of the COVID-19 declaration under Section 56 4(b)(1) of the Act, 21 U.S.C. section 360bbb-3(b)(1), unless the authorization is terminated or revoked sooner. Performed at [facility],  [address] [place], [place] [address]      Time coordinating discharge: Over 30 minutes  SIGNED:   Desiree Hane, MD  [facility] 01/03/2019, 12:49 PM Pager   If 7PM-7AM, please contact night-coverage [email or web address] Password [email or web address]

## 2019-01-03 NOTE — Progress Notes (Addendum)
     Progress Note    ASSESSMENT AND PLAN:   27. 53 yo male with hx of Etoh abuse, PUD presenting with abdominal pain / CT scan raising concern for pancreatitis / dark heme + stool with drifting hgb over last few weeks.   -No ulcers on EGD yesterday. A single non-bleeding erosion was found in duodenum - biopsy pending  There was a localized area of congestion in D2 - biopsies pending. No explanation for drifting hgb. BM suppression from Etoh maybe? Stools probably dark due to iron though patient says he had not taken it for a few days -He may have mild pancreatitis but pain was transient. No pain today, about to eat breakfast.  -Continue daily PPI -Will make hospital follow up appt  2. Etoh abuse - This is his biggest issue. He had a 4 year period of sobriety a few years back. He isn't opposed to AA.     SUBJECTIVE   No abdominal pain. Slept okay. Has had a normal brown BM within last couple of days.   OBJECTIVE:     Vital signs in last 24 hours: Temp:  [98.1 F (36.7 C)-99.7 F (37.6 C)] 98.6 F (37 C) (10/14 0517) Pulse Rate:  [73-87] 85 (10/14 0517) Resp:  [18] 18 (10/14 0517) BP: (140-151)/(79-90) 140/79 (10/14 0517) SpO2:  [94 %-97 %] 94 % (10/14 0517) Last BM Date: 01/02/19 General:   Alert, well-developed male in NAD EENT:  Normal hearing, non icteric sclera, conjunctive pink.  Heart:  Regular rate and rhythm;  No lower extremity edema   Pulm: Normal respiratory effort, lungs CTA bilaterally without wheezes or crackles. Abdomen:  Soft, nondistended, nontender.  Normal bowel sounds.          Neurologic:  Alert and  oriented x4;  grossly normal neurologically. Psych:  Pleasant, cooperative.  Normal mood and affect.   Intake/Output from previous day: 10/13 0701 - 10/14 0700 In: 751.6 [P.O.:300; I.V.:451.6] Out: -  Intake/Output this shift: No intake/output data recorded.  Lab Results: Recent Labs    12/31/18 2310 01/01/19 0514 01/02/19 0539  WBC 4.3 2.9*  3.4*  HGB 9.8* 9.3* 9.6*  HCT 30.2* 28.8* 29.2*  PLT 35* 34* 48*   BMET Recent Labs    12/31/18 2310 01/01/19 0514 01/02/19 0539  NA 134* 138 130*  K 3.1* 4.0 3.3*  CL 102 107 99  CO2 21* 20* 23  GLUCOSE 96 67* 170*  BUN 5* <5* <5*  CREATININE 0.56* 0.57* 0.59*  CALCIUM 9.0 8.1* 8.0*   LFT Recent Labs    01/01/19 0514  PROT 6.8  ALBUMIN 3.1*  AST 86*  ALT 20  ALKPHOS 152*  BILITOT 1.7*  BILIDIR 0.6*  IBILI 1.1*   PT/INR Recent Labs    12/31/18 2310  LABPROT 14.5  INR 1.1      LOS: 2 days   Tye Savoy ,NP 01/03/2019, 9:06 AM   ________________________________________________________________________  Velora Heckler GI MD note:  I personally examined the patient, reviewed the data and agree with the assessment and plan described above.  He seems quite disinclined to stop drinking and I expect that he will continue to have GI issues and other health problems as a result.  Ok to d/c from my perpsective.   Please call or page with any further questions or concerns.  Owens Loffler, MD [facility] Gastroenterology Pager [phone]

## 2019-01-03 NOTE — Anesthesia Postprocedure Evaluation (Signed)
Anesthesia Post Note  Patient: Barry Moore  Procedure(s) Performed: ESOPHAGOGASTRODUODENOSCOPY (EGD) (N/A ) BIOPSY     Patient location during evaluation: Endoscopy Anesthesia Type: MAC Level of consciousness: awake and alert Pain management: pain level controlled Vital Signs Assessment: post-procedure vital signs reviewed and stable Respiratory status: spontaneous breathing, nonlabored ventilation, respiratory function stable and patient connected to nasal cannula oxygen Cardiovascular status: blood pressure returned to baseline and stable Postop Assessment: no apparent nausea or vomiting Anesthetic complications: no    Last Vitals:  Vitals:   01/03/19 1145 01/03/19 1340  BP:  125/85  Pulse:  96  Resp:  20  Temp:  37 C  SpO2: 97% 100%    Last Pain:  Vitals:   01/03/19 0800  TempSrc:   PainSc: [place]

## 2019-01-03 NOTE — Discharge Instructions (Signed)
Alcohol Abuse and Dependence Information, Adult Alcohol is a widely available drug. People drink alcohol in different amounts. People who drink alcohol very often and in large amounts often have problems during and after drinking. They may develop what is called an alcohol use disorder. There are two main types of alcohol use disorders:  Alcohol abuse. This is when you use alcohol too much or too often. You may use alcohol to make yourself feel happy or to reduce stress. You may have a hard time setting a limit on the amount you drink.  Alcohol dependence. This is when you use alcohol consistently for a period of time, and your body changes as a result. This can make it hard to stop drinking because you may start to feel sick or feel different when you do not use alcohol. These symptoms are known as withdrawal. How can alcohol abuse and dependence affect me? Alcohol abuse and dependence can have a negative effect on your life. Drinking too much can lead to addiction. You may feel like you need alcohol to function normally. You may drink alcohol before work in the morning, during the day, or as soon as you get home from work in the evening. These actions can result in:  Poor work performance.  Job loss.  Financial problems.  Car crashes or criminal charges from driving after drinking alcohol.  Problems in your relationships with friends and family.  Losing the trust and respect of coworkers, friends, and family. Drinking heavily over a long period of time can permanently damage your body and brain, and can cause lifelong health issues, such as:  Damage to your liver or pancreas.  Heart problems, high blood pressure, or stroke.  Certain cancers.  Decreased ability to fight infections.  Brain or nerve damage.  Depression.  Early (premature) death. If you are careless or you crave alcohol, it is easy to drink more than your body can handle (overdose). Alcohol overdose is a serious  situation that requires hospitalization. It may lead to permanent injuries or death. What can increase my risk?  Having a family history of alcohol abuse.  Having depression or other mental health conditions.  Beginning to drink at an early age.  Binge drinking often.  Experiencing trauma, stress, and an unstable home life during childhood.  Spending time with people who drink often. What actions can I take to prevent or manage alcohol abuse and dependence?  Do not drink alcohol if: ? Your health care provider tells you not to drink. ? You are pregnant, may be pregnant, or are planning to become pregnant.  If you drink alcohol: ? Limit how much you use to:  0-1 drink a day for women.  0-2 drinks a day for men. ? Be aware of how much alcohol is in your drink. In the [place] one drink equals one 12 oz bottle of beer (355 mL), one 5 oz glass of wine (148 mL), or one 1 oz glass of hard liquor (44 mL).  Stop drinking if you have been drinking too much. This can be very hard to do if you are used to abusing alcohol. If you begin to have withdrawal symptoms, talk with your health care provider or a person that you trust. These symptoms may include anxiety, shaky hands, headache, nausea, sweating, or not being able to sleep.  Choose to drink nonalcoholic beverages in social gatherings and places where there may be alcohol. Activity  Spend more time on activities that you enjoy that do   not involve alcohol, like hobbies or exercise.  Find healthy ways to cope with stress, such as exercise, meditation, or spending time with people you care about. General information  Talk to your family, coworkers, and friends about supporting you in your efforts to stop drinking. If they drink, ask them not to drink around you. Spend more time with people who do not drink alcohol.  If you think that you have an alcohol dependency problem: ? Tell friends or family about your concerns. ? Talk with your  health care provider or another health professional about where to get help. ? Work with a therapist and a chemical dependency counselor. ? Consider joining a support group for people who struggle with alcohol abuse and dependence. Where to find support   Your health care provider.  SMART Recovery: www.smartrecovery.org Therapy and support groups  Local treatment centers or chemical dependency counselors.  Local AA groups in your community: www.aa.org Where to find more information  Centers for Disease Control and Prevention: www.cdc.gov  National Institute on Alcohol Abuse and Alcoholism: www.niaaa.nih.gov  Alcoholics Anonymous (AA): www.aa.org Contact a health care provider if:  You drank more or for longer than you intended on more than one occasion.  You tried to stop drinking or to cut back on how much you drink, but you were not able to.  You often drink to the point of vomiting or passing out.  You want to drink so badly that you cannot think about anything else.  You have problems in your life due to drinking, but you continue to drink.  You keep drinking even though you feel anxious, depressed, or have experienced memory loss.  You have stopped doing the things you used to enjoy in order to drink.  You have to drink more than you used to in order to get the effect you want.  You experience anxiety, sweating, nausea, shakiness, and trouble sleeping when you try to stop drinking. Get help right away if:  You have thoughts about hurting yourself or others.  You have serious withdrawal symptoms, including: ? Confusion. ? Racing heart. ? High blood pressure. ? Fever. If you ever feel like you may hurt yourself or others, or have thoughts about taking your own life, get help right away. You can go to your nearest emergency department or call:  Your local emergency services (911 in the [place]  A suicide crisis helpline, such as the National Suicide Prevention  Lifeline at 1-800-273-8255. This is open 24 hours a day. Summary  Alcohol abuse and dependence can have a negative effect on your life. Drinking too much or too often can lead to addiction.  If you drink alcohol, limit how much you use.  If you are having trouble keeping your drinking under control, find ways to change your behavior. Hobbies, calming activities, exercise, or support groups can help.  If you feel you need help with changing your drinking habits, talk with your health care provider, a good friend, or a therapist, or go to an AA group. This information is not intended to replace advice given to you by your health care provider. Make sure you discuss any questions you have with your health care provider. Document Released: 03/02/2016 Document Revised: 06/27/2018 Document Reviewed: 05/16/2018 Elsevier Patient Education  2020 Elsevier Inc.  

## 2019-01-04 ENCOUNTER — Encounter (HOSPITAL_COMMUNITY): Payer: Self-pay | Admitting: Internal Medicine

## 2019-01-08 ENCOUNTER — Encounter: Payer: Self-pay | Admitting: Internal Medicine

## 2019-01-08 NOTE — Progress Notes (Signed)
Duodenitis Letter created to be sent

## 2019-01-17 ENCOUNTER — Ambulatory Visit: Payer: Self-pay | Admitting: Nurse Practitioner

## 2019-02-01 ENCOUNTER — Other Ambulatory Visit: Payer: Self-pay

## 2019-02-01 ENCOUNTER — Encounter: Payer: Self-pay | Admitting: Family Medicine

## 2019-02-01 ENCOUNTER — Ambulatory Visit: Payer: Self-pay | Attending: Family Medicine | Admitting: Family Medicine

## 2019-02-01 DIAGNOSIS — R110 Nausea: Secondary | ICD-10-CM

## 2019-02-01 DIAGNOSIS — D62 Acute posthemorrhagic anemia: Secondary | ICD-10-CM

## 2019-02-01 DIAGNOSIS — I10 Essential (primary) hypertension: Secondary | ICD-10-CM

## 2019-02-01 DIAGNOSIS — Z8719 Personal history of other diseases of the digestive system: Secondary | ICD-10-CM

## 2019-02-01 DIAGNOSIS — F1094 Alcohol use, unspecified with alcohol-induced mood disorder: Secondary | ICD-10-CM

## 2019-02-01 DIAGNOSIS — F1029 Alcohol dependence with unspecified alcohol-induced disorder: Secondary | ICD-10-CM

## 2019-02-01 DIAGNOSIS — Z09 Encounter for follow-up examination after completed treatment for conditions other than malignant neoplasm: Secondary | ICD-10-CM

## 2019-02-01 DIAGNOSIS — K709 Alcoholic liver disease, unspecified: Secondary | ICD-10-CM

## 2019-02-01 MED ORDER — VENLAFAXINE HCL ER 75 MG PO CP24: ORAL_CAPSULE | ORAL | 2 refills | Status: DC

## 2019-02-01 MED ORDER — PANTOPRAZOLE SODIUM 40 MG PO TBEC: 40.0000 mg | DELAYED_RELEASE_TABLET | Freq: Two times a day (BID) | ORAL | 1 refills | Status: DC

## 2019-02-01 NOTE — Progress Notes (Signed)
Patient verified DOB Patient has eaten today. Patient has not taken medication today. Patient denies pain at this time. Patient is having trouble with depression.

## 2019-02-07 ENCOUNTER — Ambulatory Visit: Payer: Self-pay | Admitting: Pharmacist

## 2019-02-14 ENCOUNTER — Institutional Professional Consult (permissible substitution): Payer: Self-pay | Admitting: Licensed Clinical Social Worker

## 2019-02-17 NOTE — Progress Notes (Deleted)
02/17/2019 Pranesh Spraggs [id number] 11/23/1965   HISTORY OF PRESENT ILLNESS: Evian Ficco is a 53 y.o. male with a past medical history of alcoholic hepatitis, pancreatitis, seizures, hypertension depression and substance abuse. He was admitted to the hospital with a GI bleed May 2020, June 2020 and most recently 12/31/2018.  He presented to [facility] on 12/31/2018 with abdominal pain and dark stools on oral iron.  His admission hemoglobin was 9.5.  Platelet count 35,000.  Alcohol level 316.  An abdominal/pelvic CT showed peripancreatic fat stranding suspicious for acute pancreatitis.  He was initially started on an Octreotide infusion. He underwent an EGD by  Dr. 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  He was assessed by Dr. Carlean Purl to have group pancreatitis as a primary issue with recommendations to abstain from alcohol. Biopsies were consistent with peptic duodenitis.  Octreotide was discontinued.  His hemoglobin remained stable at 9.6.  He was discharged home 01/03/2019 on Pantoprazole 40 mg daily.  CBC Latest Ref Rng & Units 01/02/2019 01/01/2019 12/31/2018  WBC 4.0 - 10.5 K/uL 3.4(L) 2.9(L) 4.3  Hemoglobin 13.0 - 17.0 g/dL 9.6(L) 9.3(L) 9.8(L)  Hematocrit 39.0 - 52.0 % 29.2(L) 28.8(L) 30.2(L)  Platelets 150 - 400 K/uL 48(L) 34(L) 35(L)     Dr. Rush Landmark was first [facility] to see pt in hospital.   EGD 01/02/2019: - Duodenal erosion without bleeding.  Biopsies consistent with peptic duodenitis, no dysplasia or malignancy. - Mucosal changes in the duodenum. 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 previously bxed and no intestinal metaplasia  EGD 08/05/2018: - White nummular lesions in esophageal mucosa. Biopsied. - Grade I and small (< 5 mm) esophageal varices. - LA Grade B  esophagitis. - Salmon-colored mucosa suspicious for short-segment Barrett's esophagus. - Portal hypertensive gastropathy. - Erosive gastropathy. Biopsied. - Duodenal ulcer with a clean ulcer base (Forrest Class III). - Nodular mucosa in the duodenal bulb. Biopsied. - No gross lesions in the second portion of the duodenum. Path - Chronic gastritis, candida esophagitis, no Barrett's esophagus.  EGD 11/02/2016 by Dr.Hung: - 3 cm hiatal hernia. - Non-obstructing and mild Schatzki ring. - Portal hypertensive gastropathy. - One non-bleeding duodenal ulcer with no stigmata of bleeding. - Duodenitis. - No specimens collected  Colonoscopy by Dr. Tarri Glenn 08/07/2018: - Hemorrhoids found on perianal exam. - Two 2 to 3 mm tubular adenomatous and sessile serrated polyps in the ascending colon and in the cecum, removed with a cold snare. No dysplasia.  - A single (solitary) ulcer in the proximal ascending colon. This is of unclear clinical significance. Biopsies consistent with ischemic type changes.  - The entire examined colon is normal. Biopsied given the unexplained ascending colon Ulcer. Random colon biopsies showed colonic mucosa with no significant pathologic findings. Negative for active inflammation.  - The examined portion of the ileum was normal. - The examination was otherwise normal on direct and retroflexion views. -Recall colonoscopy 7 years.   Abdominal/pelvic CT 12/13/2018: 1. Peripancreatic fat stranding about the head and uncinate process suspicious for acute pancreatitis, slightly progressed from exam last month. No peripancreatic fluid collection or pancreatic necrosis. 2. Small volume of extraluminal mesenteric air adjacent to the transverse colon, without associated colonic wall thickening or adjacent inflammation. Underlying etiology and significance is uncertain. 3. Hepatic steatosis. Slight nodular hepatic contour suggesting cirrhosis. Recannulated umbilical  vein  consistent with portal hypertension. 4. Similar appearance of gallbladder to prior with wall thickening versus pericholecystic fluid. 5. Chronic urinary bladder wall thickening. 6. Small focal subcutaneous contusion involving the left lateral flank. 7. Small fat containing hernia. 8. Distal esophageal wall thickening.  Aortic Atherosclerosis (ICD10-I70.0).  Abdominal sonogram 11/04/2018: 1. A sludge ball or possibly a stone in the gallbladder with diffusely thickened edematous gallbladder wall similar to prior ultrasound. This may be related to chronic gallbladder disease or secondary to underlying liver disease. Clinical correlation is recommended. A hepatobiliary scintigraphy may provide better evaluation of the gallbladder if there is a high clinical concern for acute cholecystitis . 2. Fatty liver.  Past Medical History:  Diagnosis Date  . Alcoholic hepatitis   . Depression   . ETOH abuse   . Gastric bleed 08/2018  . Hypertension   . Pancreatitis   . Seizures ([place])   . Suicidal behavior    Past Surgical History:  Procedure Laterality Date  . BIOPSY  08/05/2018   Procedure: BIOPSY;  Surgeon: Rush Landmark Telford Nab., MD;  Location: [place];  Service: Gastroenterology;;  . BIOPSY  08/07/2018   Procedure: BIOPSY;  Surgeon: Thornton Park, MD;  Location: [facility] ENDOSCOPY;  Service: Gastroenterology;;  . BIOPSY  01/02/2019   Procedure: BIOPSY;  Surgeon: Gatha Mayer, MD;  Location: [facility] ENDOSCOPY;  Service: Endoscopy;;  . COLONOSCOPY WITH PROPOFOL N/A 08/07/2018   Procedure: COLONOSCOPY WITH PROPOFOL;  Surgeon: Thornton Park, MD;  Location: [place];  Service: Gastroenterology;  Laterality: N/A;  . ESOPHAGOGASTRODUODENOSCOPY N/A 01/02/2019   Procedure: ESOPHAGOGASTRODUODENOSCOPY (EGD);  Surgeon: Gatha Mayer, MD;  Location: [facility];  Service: Endoscopy;  Laterality: N/A;  . ESOPHAGOGASTRODUODENOSCOPY (EGD) WITH PROPOFOL N/A 11/02/2016   Procedure:  ESOPHAGOGASTRODUODENOSCOPY (EGD) WITH PROPOFOL;  Surgeon: Carol Ada, MD;  Location: [facility]OS[facility];  Service: Endoscopy;  Laterality: N/A;  . ESOPHAGOGASTRODUODENOSCOPY (EGD) WITH PROPOFOL N/A 08/05/2018   Procedure: ESOPHAGOGASTRODUODENOSCOPY (EGD) WITH PROPOFOL;  Surgeon: Rush Landmark Telford Nab., MD;  Location: [place];  Service: Gastroenterology;  Laterality: N/A;  . HERNIA REPAIR    . LEG SURGERY    . POLYPECTOMY  08/07/2018   Procedure: POLYPECTOMY;  Surgeon: Thornton Park, MD;  Location: [place];  Service: Gastroenterology;;    reports that he has been smoking cigarettes. He has been smoking about 1.00 pack per day. He has never used smokeless tobacco. He reports current alcohol use. He reports previous drug use. Frequency: 3.00 times per week. Drug: Cocaine. family history includes Alcoholism in his father and mother; Diabetes in his mother; Emphysema in his father; Lung cancer in his father. Allergies  Allergen Reactions  . Tomato Shortness Of Breath and Nausea And Vomiting  . Aspirin Nausea And Vomiting      Outpatient Encounter Medications as of 02/19/2019  Medication Sig  . folic acid (FOLVITE) 1 MG tablet Take 1 tablet (1 mg total) by mouth daily.  . Multiple Vitamin (MULTIVITAMIN WITH MINERALS) TABS tablet Take 1 tablet by mouth daily.  . pantoprazole (PROTONIX) 40 MG tablet Take 1 tablet (40 mg total) by mouth 2 (two) times daily. To reduce stomach acid  . thiamine 100 MG tablet Take 1 tablet (100 mg total) by mouth daily.  [name] venlafaxine XR (EFFEXOR XR) 75 MG 24 hr capsule One every morning  . [DISCONTINUED] ferrous sulfate 325 (65 FE) MG tablet Take 1 tablet (325 mg total) by mouth daily with breakfast. (Patient not taking: Reported on 09/23/2018)   No facility-administered encounter medications on file  as of 02/19/2019.      REVIEW OF SYSTEMS  : All other systems reviewed and negative except where noted in the History of Present Illness.   PHYSICAL EXAM:  There were no vitals taken for this visit. General: Well developed male in no acute distress Head: Normocephalic and atraumatic Eyes:  sclerae anicteric,conjunctive pink. Ears: Normal auditory acuity Neck: Supple, no masses.  Lungs: Clear throughout to auscultation Heart: Regular rate and rhythm Abdomen: Soft, nontender, non distended. No masses or hepatomegaly noted. Normal bowel sounds Rectal: *** Musculoskeletal: Symmetrical with no gross deformities  Skin: No lesions on visible extremities Extremities: No edema  Neurological: Alert oriented x 4, grossly nonfocal Cervical Nodes:  No significant cervical adenopathy Psychological:  Alert and cooperative. Normal mood and affect  ASSESSMENT AND PLAN:  56. 53 year old male with alcoholic cirrhosis + hepatic steatosis  -Continue Thiamine 100mg  po QD and Folic acid 1mg  QD -No alcohol  2. Recurrent GI bleeding secondary to a duodenal ulcer in setting of alcohol abuse  -Continue Pantoprazole 40mg  QD -CBC, CMP, GGT, Iron panel, Vitamin B12,PT/INR  3. 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  -See plan # 2  4. Thrombocytopenia secondary to cirrhosis -Repeat CBC with PLT count   5. History of colon polyps -Next colonoscopy due 07/2025, earlier if symptoms warrant     CC:  Antony Blackbird, MD

## 2019-02-19 ENCOUNTER — Ambulatory Visit: Payer: Self-pay | Admitting: Nurse Practitioner

## 2019-03-01 ENCOUNTER — Institutional Professional Consult (permissible substitution): Payer: Self-pay | Admitting: Licensed Clinical Social Worker

## 2019-03-05 ENCOUNTER — Institutional Professional Consult (permissible substitution): Payer: Self-pay | Admitting: Licensed Clinical Social Worker

## 2019-03-29 ENCOUNTER — Emergency Department (HOSPITAL_COMMUNITY)
Admission: EM | Admit: 2019-03-29 | Discharge: 2019-03-29 | Disposition: A | Discharge status: Home / Self Care | Payer: Medicaid Other | Attending: Emergency Medicine | Admitting: Emergency Medicine

## 2019-03-29 ENCOUNTER — Encounter (HOSPITAL_COMMUNITY): Payer: Self-pay

## 2019-03-29 ENCOUNTER — Other Ambulatory Visit: Payer: Self-pay

## 2019-03-29 DIAGNOSIS — R197 Diarrhea, unspecified: Secondary | ICD-10-CM | POA: Insufficient documentation

## 2019-03-29 DIAGNOSIS — R05 Cough: Secondary | ICD-10-CM | POA: Insufficient documentation

## 2019-03-29 DIAGNOSIS — M791 Myalgia, unspecified site: Secondary | ICD-10-CM

## 2019-03-29 DIAGNOSIS — R112 Nausea with vomiting, unspecified: Secondary | ICD-10-CM | POA: Insufficient documentation

## 2019-03-29 DIAGNOSIS — F17210 Nicotine dependence, cigarettes, uncomplicated: Secondary | ICD-10-CM | POA: Insufficient documentation

## 2019-03-29 DIAGNOSIS — M79602 Pain in left arm: Secondary | ICD-10-CM | POA: Insufficient documentation

## 2019-03-29 DIAGNOSIS — I10 Essential (primary) hypertension: Secondary | ICD-10-CM | POA: Insufficient documentation

## 2019-03-29 DIAGNOSIS — Z79899 Other long term (current) drug therapy: Secondary | ICD-10-CM | POA: Insufficient documentation

## 2019-03-29 DIAGNOSIS — M549 Dorsalgia, unspecified: Secondary | ICD-10-CM | POA: Insufficient documentation

## 2019-03-29 NOTE — ED Triage Notes (Signed)
Pt BIB EMS, pt laying in [place] in [place]. Pt told EMS upon arrival that he could not get up due to back pain. Pt has chronic back pain.   146/82 82 HR 18 Resp 97% RA

## 2019-03-29 NOTE — ED Provider Notes (Signed)
[facility] DEPT Provider Note   CSN: [id number] Arrival date & time: 03/29/19  1050     History Chief Complaint  Patient presents with  . Back Pain    Barry Moore is a 54 y.o. male with a past medical history of alcohol abuse, hypertension presenting to the ED with a chief complaint of myalgias.  Reports back pain, nausea, vomiting, diarrhea, cough and left arm pain.  Symptoms began yesterday but worsened today. Patient is a poor historian, appears intoxicated, stating "I think I'm in pretty good shape overall."  Denies any shortness of breath, hematemesis, injuries or falls. Does endorse drinking today.  HPI     Past Medical History:  Diagnosis Date  . Alcoholic hepatitis   . Depression   . ETOH abuse   . Gastric bleed 08/2018  . Hypertension   . Pancreatitis   . Seizures ([place])   . Suicidal behavior     Patient Active Problem List   Diagnosis Date Noted  . Duodenitis   . Acute upper GI bleed 01/01/2019  . Hypokalemia 01/01/2019  . Alcohol dependence ([place]) 01/01/2019  . Alcoholic liver disease ([place]) 01/01/2019  . Acute pancreatitis 12/13/2018  . Free intraperitoneal air 12/13/2018  . Homicidal ideation   . Gastrointestinal hemorrhage with melena 08/31/2018  . Colon ulcer   . Polyp of ascending colon   . Acute GI bleeding 08/05/2018  . Transaminitis 08/05/2018  . Abdominal pain 08/05/2018  . Nausea and vomiting 08/05/2018  . Substance induced mood disorder ([place]) 07/16/2017  . Severe recurrent major depression without psychotic features ([place]) 05/15/2017  . Anemia 03/18/2017  . Acute blood loss anemia 02/21/2017  . UGI bleed 02/08/2017  . Alcohol withdrawal ([place]) 02/08/2017  . Cocaine abuse ([place]) 11/18/2016  . GI bleed 11/01/2016  . Alcohol abuse   . Major depressive disorder, recurrent severe without psychotic features ([place]) 07/06/2016  . Alcohol-induced mood disorder ([place]) 06/15/2016  . Seizure ([place]) 05/26/2016  . Tobacco  abuse 05/26/2016  . Homeless 05/26/2016  . Essential hypertension 05/26/2016  . Alcohol dependence with alcohol-induced mood disorder ([place]) 02/14/2016  . Severe alcohol withdrawal without perceptual disturbances without complication ([place]) [id number]  . Alcoholic hepatitis without ascites 02/14/2016  . Thrombocytopenia ([place]) 02/14/2016    Past Surgical History:  Procedure Laterality Date  . BIOPSY  08/05/2018   Procedure: BIOPSY;  Surgeon: Rush Landmark Telford Nab., MD;  Location: [facility];  Service: Gastroenterology;;  . BIOPSY  08/07/2018   Procedure: BIOPSY;  Surgeon: Thornton Park, MD;  Location[facility]COPY;  Service: Gastroenterology;;  . BIOPSY  01/02/2019   Procedure: BIOPSY;  Surgeon: Gatha Mayer, MD;  Location: [facility];  Service: Endoscopy;;  . COLONOSCOPY WITH PROPOFOL N/A 08/07/2018   Procedure: COLONOSCOPY WITH PROPOFOL;  Surgeon: Thornton Park, MD;  Location: [place];  Service: Gastroenterology;  Laterality: N/A;  . ESOPHAGOGASTRODUODENOSCOPY N/A 01/02/2019   Procedure: ESOPHAGOGASTRODUODENOSCOPY (EGD);  Surgeon: Gatha Mayer, MD;  Location: [facility];  Service: Endoscopy;  Laterality: N/A;  . ESOPHAGOGASTRODUODENOSCOPY (EGD) WITH PROPOFOL N/A 11/02/2016   Procedure: ESOPHAGOGASTRODUODENOSCOPY (EGD) WITH PROPOFOL;  Surgeon: Carol Ada, MD;  Location: [facility];  Service: Endoscopy;  Laterality: N/A;  . ESOPHAGOGASTRODUODENOSCOPY (EGD) WITH PROPOFOL N/A 08/05/2018   Procedure: ESOPHAGOGASTRODUODENOSCOPY (EGD) WITH PROPOFOL;  Surgeon: Rush Landmark Telford Nab., MD;  Location: [place];  Service: Gastroenterology;  Laterality: N/A;  . HERNIA REPAIR    . LEG SURGERY    . POLYPECTOMY  08/07/2018   Procedure: POLYPECTOMY;  Surgeon: Tarri Glenn,  Joelene Millin, MD;  Location: [facility] ENDOSCOPY;  Service: Gastroenterology;;       Family History  Problem Relation Age of Onset  . Diabetes Mother   . Alcoholism Mother   . Emphysema Father   . Lung cancer  Father   . Alcoholism Father     Social History   Tobacco Use  . Smoking status: Current Every Day Smoker    Packs/day: 1.00    Types: Cigarettes  . Smokeless tobacco: Never Used  Substance Use Topics  . Alcohol use: Yes    Comment: 5+ BEERS DAILY  . Drug use: Not Currently    Frequency: 3.0 times per week    Types: Cocaine    Home Medications Prior to Admission medications   Medication Sig Start Date End Date Taking? Authorizing Provider  folic acid (FOLVITE) 1 MG tablet Take 1 tablet (1 mg total) by mouth daily. 01/04/19   Desiree Hane, MD  Multiple Vitamin (MULTIVITAMIN WITH MINERALS) TABS tablet Take 1 tablet by mouth daily. 01/04/19   Desiree Hane, MD  pantoprazole (PROTONIX) 40 MG tablet Take 1 tablet (40 mg total) by mouth 2 (two) times daily. To reduce stomach acid 02/01/19 04/02/19  Fulp, Cammie, MD  thiamine 100 MG tablet Take 1 tablet (100 mg total) by mouth daily. 01/04/19   Oretha Milch D, MD  venlafaxine XR (EFFEXOR XR) 75 MG 24 hr capsule One every morning 02/01/19   Fulp, Cammie, MD  ferrous sulfate 325 (65 FE) MG tablet Take 1 tablet (325 mg total) by mouth daily with breakfast. Patient not taking: Reported on 09/23/2018 08/09/18 10/09/18  Elodia Florence., MD    Allergies    Tomato and Aspirin  Review of Systems   Review of Systems  Constitutional: Negative for appetite change and fever.  Respiratory: Positive for cough.   Gastrointestinal: Positive for diarrhea, nausea and vomiting.  Musculoskeletal: Positive for myalgias.    Physical Exam Updated Vital Signs BP 139/78 (BP Location: Left Arm)   Pulse 76   Temp 97.6 F (36.4 C) (Oral)   Resp 18   Ht 5\' 8"  (1.727 m)   SpO2 100%   BMI 23.42 kg/m   Physical Exam Vitals and nursing note reviewed.  Constitutional:      General: He is not in acute distress.    Appearance: He is well-developed. He is not diaphoretic.  HENT:     Head: Normocephalic and atraumatic.  Eyes:     General:  No scleral icterus.    Conjunctiva/sclera: Conjunctivae normal.  Cardiovascular:     Rate and Rhythm: Normal rate and regular rhythm.     Heart sounds: Normal heart sounds.  Pulmonary:     Effort: Pulmonary effort is normal. No respiratory distress.     Breath sounds: Normal breath sounds.  Musculoskeletal:     Cervical back: Normal range of motion.     Comments: Diffuse TTP of the L arm without overlying skin changes or deformities.  2+ radial pulse palpated. No erythema or warmth of extremity.  Skin:    Findings: No rash.  Neurological:     Mental Status: He is alert.     ED Results / Procedures / Treatments   Labs (all labs ordered are listed, but only abnormal results are displayed) Labs Reviewed - No data to display  EKG EKG Interpretation  Date/Time:  Thursday March 29 2019 11:34:03 EST Ventricular Rate:  77 PR Interval:    QRS Duration: 95 QT Interval:  382 QTC Calculation: 433 R Axis:   92 Text Interpretation: Sinus rhythm Borderline right axis deviation Low voltage, precordial leads no acute ST/T changes Confirmed by Sherwood Gambler [phone]) on 03/29/2019 11:53:28 AM   Radiology No results found.  Procedures Procedures (including critical care time)  Medications Ordered in ED Medications - No data to display  ED Course  I have reviewed the triage vital signs and the nursing notes.  Pertinent labs & imaging results that were available during my care of the patient were reviewed by me and considered in my medical decision making (see chart for details).    MDM Rules/Calculators/A&P                      54 year old male with past medical history of alcohol abuse, prior GI bleeds, presenting to the ED for multiple complaints.  He essentially complains of generalized body aches including nausea, vomiting, diarrhea and cough.  He also complains of left sided arm pain.  He appears intoxicated on exam, but denies any focal chest pain or shortness of breath.  EKG  with no ischemic changes, shows sinus rhythm.  Patient is comfortable here on exam.  I doubt ACS or other emergent cause of his symptoms.  We will have him continue her medications and return for worsening symptoms.  Patient is hemodynamically stable, in NAD, and able to ambulate in the ED. Evaluation does not show pathology that would require ongoing emergent intervention or inpatient treatment. I explained the diagnosis to the patient. Pain has been managed and has no complaints prior to discharge. Patient is comfortable with above plan and is stable for discharge at this time. All questions were answered prior to disposition. Strict return precautions for returning to the ED were discussed. Encouraged follow up with PCP.   An After Visit Summary was printed and given to the patient.   Portions of this note were generated with Lobbyist. Dictation errors may occur despite best attempts at proofreading.  Final Clinical Impression(s) / ED Diagnoses Final diagnoses:  Myalgia    Rx / DC Orders ED Discharge Orders    None       Delia Heady, PA-C 03/29/19 Hamblen, Adam, DO 03/29/19 1512

## 2019-03-29 NOTE — ED Notes (Signed)
An After Visit Summary was printed and given to the patient. Discharge instructions given and no further questions at this time, pt denies chest pain.

## 2019-03-29 NOTE — Discharge Instructions (Signed)
Continue your home medications as previously prescribed. Return to the ED if you start to have worsening symptoms, chest pain, shortness of breath, injuries or falls, numbness in arms or legs.

## 2019-04-28 ENCOUNTER — Emergency Department (HOSPITAL_COMMUNITY): Payer: Self-pay

## 2019-04-28 ENCOUNTER — Emergency Department (HOSPITAL_COMMUNITY)
Admission: EM | Admit: 2019-04-28 | Discharge: 2019-04-29 | Disposition: A | Discharge status: Home / Self Care | Payer: Self-pay | Attending: Emergency Medicine | Admitting: Emergency Medicine

## 2019-04-28 ENCOUNTER — Encounter (HOSPITAL_COMMUNITY): Payer: Self-pay | Admitting: Emergency Medicine

## 2019-04-28 ENCOUNTER — Other Ambulatory Visit: Payer: Self-pay

## 2019-04-28 DIAGNOSIS — F17210 Nicotine dependence, cigarettes, uncomplicated: Secondary | ICD-10-CM | POA: Insufficient documentation

## 2019-04-28 DIAGNOSIS — I10 Essential (primary) hypertension: Secondary | ICD-10-CM | POA: Insufficient documentation

## 2019-04-28 DIAGNOSIS — Z20822 Contact with and (suspected) exposure to covid-19: Secondary | ICD-10-CM | POA: Insufficient documentation

## 2019-04-28 DIAGNOSIS — G8929 Other chronic pain: Secondary | ICD-10-CM

## 2019-04-28 DIAGNOSIS — R45851 Suicidal ideations: Secondary | ICD-10-CM | POA: Insufficient documentation

## 2019-04-28 DIAGNOSIS — Z79899 Other long term (current) drug therapy: Secondary | ICD-10-CM | POA: Insufficient documentation

## 2019-04-28 DIAGNOSIS — D61811 Other drug-induced pancytopenia: Secondary | ICD-10-CM | POA: Insufficient documentation

## 2019-04-28 DIAGNOSIS — Y908 Blood alcohol level of 240 mg/100 ml or more: Secondary | ICD-10-CM | POA: Insufficient documentation

## 2019-04-28 DIAGNOSIS — F10920 Alcohol use, unspecified with intoxication, uncomplicated: Secondary | ICD-10-CM

## 2019-04-28 DIAGNOSIS — F332 Major depressive disorder, recurrent severe without psychotic features: Secondary | ICD-10-CM | POA: Insufficient documentation

## 2019-04-28 DIAGNOSIS — F10229 Alcohol dependence with intoxication, unspecified: Secondary | ICD-10-CM | POA: Insufficient documentation

## 2019-04-28 LAB — URINALYSIS, ROUTINE W REFLEX MICROSCOPIC
Bacteria, UA: NONE SEEN
Bilirubin Urine: NEGATIVE
Glucose, UA: NEGATIVE mg/dL
Ketones, ur: NEGATIVE mg/dL
Leukocytes,Ua: NEGATIVE
Nitrite: NEGATIVE
Protein, ur: NEGATIVE mg/dL
Specific Gravity, Urine: 1.002 — ABNORMAL LOW (ref 1.005–1.030)
pH: 5 (ref 5.0–8.0)

## 2019-04-28 LAB — CBC WITH DIFFERENTIAL/PLATELET
Abs Immature Granulocytes: 0.01 10*3/uL (ref 0.00–0.07)
Basophils Absolute: 0.1 10*3/uL (ref 0.0–0.1)
Basophils Relative: 2 %
Eosinophils Absolute: 0.1 10*3/uL (ref 0.0–0.5)
Eosinophils Relative: 2 %
HCT: 24.4 % — ABNORMAL LOW (ref 39.0–52.0)
Hemoglobin: 7.3 g/dL — ABNORMAL LOW (ref 13.0–17.0)
Immature Granulocytes: 0 %
Lymphocytes Relative: 39 %
Lymphs Abs: 1.9 10*3/uL (ref 0.7–4.0)
MCH: 21.5 pg — ABNORMAL LOW (ref 26.0–34.0)
MCHC: 29.9 g/dL — ABNORMAL LOW (ref 30.0–36.0)
MCV: 72 fL — ABNORMAL LOW (ref 80.0–100.0)
Monocytes Absolute: 0.7 10*3/uL (ref 0.1–1.0)
Monocytes Relative: 15 %
Neutro Abs: 2.1 10*3/uL (ref 1.7–7.7)
Neutrophils Relative %: 42 %
Platelets: 139 10*3/uL — ABNORMAL LOW (ref 150–400)
RBC: 3.39 MIL/uL — ABNORMAL LOW (ref 4.22–5.81)
RDW: 18.6 % — ABNORMAL HIGH (ref 11.5–15.5)
WBC: 4.8 10*3/uL (ref 4.0–10.5)
nRBC: 0 % (ref 0.0–0.2)

## 2019-04-28 LAB — COMPREHENSIVE METABOLIC PANEL
ALT: 26 U/L (ref 0–44)
AST: 104 U/L — ABNORMAL HIGH (ref 15–41)
Albumin: 3.8 g/dL (ref 3.5–5.0)
Alkaline Phosphatase: 129 U/L — ABNORMAL HIGH (ref 38–126)
Anion gap: 10 (ref 5–15)
BUN: <5 mg/dL — ABNORMAL LOW (ref 6–20)
CO2: 23 mmol/L (ref 22–32)
Calcium: 8.4 mg/dL — ABNORMAL LOW (ref 8.9–10.3)
Chloride: 102 mmol/L (ref 98–111)
Creatinine, Ser: 0.58 mg/dL — ABNORMAL LOW (ref 0.61–1.24)
GFR calc Af Amer: >60 mL/min (ref >60)
GFR calc non Af Amer: >60 mL/min (ref >60)
Glucose, Bld: 101 mg/dL — ABNORMAL HIGH (ref 70–99)
Potassium: 3.6 mmol/L (ref 3.5–5.1)
Sodium: 135 mmol/L (ref 135–145)
Total Bilirubin: 0.9 mg/dL (ref 0.3–1.2)
Total Protein: 8.2 g/dL — ABNORMAL HIGH (ref 6.5–8.1)

## 2019-04-28 LAB — LIPASE, BLOOD: Lipase: 50 U/L (ref 11–51)

## 2019-04-28 LAB — ETHANOL: Alcohol, Ethyl (B): 439 mg/dL (ref <10)

## 2019-04-28 MED ORDER — THIAMINE HCL 100 MG/ML IJ SOLN
100.0000 mg | Freq: Once | INTRAMUSCULAR | Status: AC
  Administered 2019-04-28: 100 mg via INTRAVENOUS
  Filled 2019-04-28: qty 2

## 2019-04-28 MED ORDER — SODIUM CHLORIDE (PF) 0.9 % IJ SOLN
INTRAMUSCULAR | Status: AC
  Filled 2019-04-28: qty 50

## 2019-04-28 MED ORDER — PANTOPRAZOLE SODIUM 40 MG IV SOLR
40.0000 mg | INTRAVENOUS | Status: AC
  Administered 2019-04-28: 40 mg via INTRAVENOUS
  Filled 2019-04-28: qty 40

## 2019-04-28 MED ORDER — ONDANSETRON HCL 4 MG/2ML IJ SOLN
4.0000 mg | Freq: Once | INTRAMUSCULAR | Status: AC
  Administered 2019-04-28: 19:00:00 4 mg via INTRAVENOUS
  Filled 2019-04-28: qty 2

## 2019-04-28 MED ORDER — IOHEXOL 300 MG/ML  SOLN
100.0000 mL | Freq: Once | INTRAMUSCULAR | Status: AC | PRN
  Administered 2019-04-28: 100 mL via INTRAVENOUS

## 2019-04-28 NOTE — ED Triage Notes (Signed)
Per EMS, patient from the curbside, reports drinking beer today. Also, c/o RUQ with N/V/D pain x2 days. Patient falling asleep during triage. While obtaining vitals patient reports "I want to die. I have got no reason to live."

## 2019-04-28 NOTE — ED Provider Notes (Signed)
Care handoff received from Margarita Mail, PA-C at shift change, please see her notes for full details of visit.  In short patient is a 54 year old male presents today via EMS for alcohol intoxication and abdominal pain with nausea and vomiting.  Labs today significant for pancytopenia suspected to be related to alcoholism and marrow suppression.  CMP with elevation of alk phos and ALT.  Alcohol level 439.  CT abdomen pelvis showed retroperitoneal stranding in the right peritoneal space which appears chronic.  No evidence of acute pancreatitis.  Patient sleeping at care handoff.  Plan of care is to await for patient to sober and ambulate then discharge. Patient seen and evaluated by Dr. Zenia Resides. - CT AP:  IMPRESSION:  Diffuse fatty infiltration of the liver.    Mild gallbladder wall thickening, less prominent than noted on prior  CT.    Stranding/fluid in the right retroperitoneum/anterior pararenal  space as described above. This is similar prior study and does not  appear to surround or involve the adjacent pancreas duodenum which  appear normal. Therefore this felt to be a chronic finding/process.    No definite acute process in the abdomen or pelvis.   Physical Exam  BP 107/75   Pulse 86   Temp 98.1 F (36.7 C) (Oral)   Resp 19   SpO2 97%   Physical Exam Constitutional:      General: He is not in acute distress.    Appearance: Normal appearance. He is well-developed. He is not ill-appearing or diaphoretic.  HENT:     Head: Normocephalic and atraumatic.     Right Ear: External ear normal.     Left Ear: External ear normal.     Nose: Nose normal.  Eyes:     General: Vision grossly intact. Gaze aligned appropriately.     Pupils: Pupils are equal, round, and reactive to light.  Neck:     Trachea: Trachea and phonation normal. No tracheal deviation.  Pulmonary:     Effort: Pulmonary effort is normal. No respiratory distress.  Skin:    General: Skin is warm and dry.   Neurological:     Mental Status: He is alert.     GCS: GCS eye subscore is 4. GCS verbal subscore is 5. GCS motor subscore is 6.     Comments: Speech is clear and goal oriented, follows commands Major Cranial nerves without deficit, no facial droop Moves extremities without ataxia, coordination intact    ED Course/Procedures     Procedures  MDM  9:00 PM: Patient reevaluated, sleeping comfortably, no acute distress.  Vital signs stable, no tachycardia or hypoxia on room air.  Respirations unlabored.  He is easily arousable to voice reports that he is feeling well and asked politely to be allowed to continue sleeping. Moves all four extremities equally. - 10:30 PM: Patient reevaluated.  Sleeping comfortably.  Vital signs stable, SPO2 97% on room air.  Heart rate mid 80s.  Respirations unlabored.  Will allow to continue sleeping. - 11:35 PM: Patient reevaluated, sleeping comfortably. NAD. Vital signs stable.  He is arousable to voice. Will allow to continue to metabolize. Remains on monitor. - Care handoff given to Mia McDonald PA-C at shift change. Plan of care is to continue to monitor. Allow to metabolize. Reassess and when sober and ambulatory anticipate discharge.  Note: Portions of this report may have been transcribed using voice recognition software. Every effort was made to ensure accuracy; however, inadvertent computerized transcription errors may still be present.  Gari Crown 04/29/19 Dyann Kief    Virgel Manifold, MD 04/29/19 1257

## 2019-04-28 NOTE — ED Notes (Signed)
  Date and time results received: 02/06/215:01 PM  Test: ETOH Critical Value: 449  Name of Provider Notified: Lacretia Leigh

## 2019-04-28 NOTE — ED Provider Notes (Signed)
Medical screening examination/treatment/procedure(s) were conducted as a shared visit with non-physician practitioner(s) and myself.  I personally evaluated the patient during the encounter.    54 year old male with history of alcohol abuse presents with abdominal discomfort.  Patient's alcohol level here for 146.  Abdominal CT without acute process.  Will allowed to metabolize to freedom   Lacretia Leigh, MD 04/28/19 2030

## 2019-04-28 NOTE — ED Provider Notes (Signed)
[facility] DEPT Provider Note   CSN: [id number] Arrival date & time: 04/28/19  1521     History Chief Complaint  Patient presents with  . Alcohol Intoxication  . Abdominal Pain  . Suicidal    Barry Moore is a 54 y.o. male who presents via EMS for alcohol intoxication and abdominal pain.  The patient is very obviously intoxicated and there is a level 5 caveat.  Essentially patient is complaining of abdominal pain nausea vomiting and diarrhea.  He has been drinking alcohol tonight but states "apparently not enough to get rid of this pain."  He is unable to provide any other history at this time.  HPI     Past Medical History:  Diagnosis Date  . Alcoholic hepatitis   . Depression   . ETOH abuse   . Gastric bleed 08/2018  . Hypertension   . Pancreatitis   . Seizures ([place])   . Suicidal behavior     Patient Active Problem List   Diagnosis Date Noted  . Duodenitis   . Acute upper GI bleed 01/01/2019  . Hypokalemia 01/01/2019  . Alcohol dependence ([place]) 01/01/2019  . Alcoholic liver disease ([place]) 01/01/2019  . Acute pancreatitis 12/13/2018  . Free intraperitoneal air 12/13/2018  . Homicidal ideation   . Gastrointestinal hemorrhage with melena 08/31/2018  . Colon ulcer   . Polyp of ascending colon   . Acute GI bleeding 08/05/2018  . Transaminitis 08/05/2018  . Abdominal pain 08/05/2018  . Nausea and vomiting 08/05/2018  . Substance induced mood disorder ([place]) 07/16/2017  . Severe recurrent major depression without psychotic features ([place]) 05/15/2017  . Anemia 03/18/2017  . Acute blood loss anemia 02/21/2017  . UGI bleed 02/08/2017  . Alcohol withdrawal ([place]) 02/08/2017  . Cocaine abuse ([place]) 11/18/2016  . GI bleed 11/01/2016  . Alcohol abuse   . 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 Seizure ([place]) 05/26/2016  . Tobacco abuse  05/26/2016  . Homeless 05/26/2016  . Essential hypertension 05/26/2016  . Alcohol dependence with alcohol-induced mood disorder ([place]) 02/14/2016  . Severe alcohol withdrawal without perceptual disturbances without complication ([place]) [id number]  . Alcoholic hepatitis without ascites 02/14/2016  . Thrombocytopenia ([place]) 02/14/2016    Past Surgical History:  Procedure Laterality Date  . BIOPSY  08/05/2018   Procedure: BIOPSY;  Surgeon: Rush Landmark Telford Nab., MD;  Location: [facility];  Service: Gastroenterology;;  . BIOPSY  08/07/2018   Procedure: BIOPSY;  Surgeon: Thornton Park, MD;  Location: [facility] ENDOSCOPY;  Service: Gastroenterology;;  . BIOPSY  01/02/2019   Procedure: BIOPSY;  Surgeon: Gatha Mayer, MD;  Location: [facility] ENDOSCOPY;  Service: Endoscopy;;  . COLONOSCOPY WITH PROPOFOL N/A 08/07/2018   Procedure: COLONOSCOPY WITH PROPOFOL;  Surgeon: Thornton Park, MD;  Location: [facility];  Service: Gastroenterology;  Laterality: N/A;  . ESOPHAGOGASTRODUODENOSCOPY N/A 01/02/2019   Procedure: ESOPHAGOGASTRODUODENOSCOPY (EGD);  Surgeon: Gatha Mayer, MD;  Location: [facility];  Service: Endoscopy;  Laterality: N/A;  . ESOPHAGOGASTRODUODENOSCOPY (EGD) WITH PROPOFOL N/A 11/02/2016   Procedure: ESOPHAGOGASTRODUODENOSCOPY (EGD) WITH PROPOFOL;  Surgeon: Carol Ada, MD;  Location: [facility] ENDOSCOPY;  Service: Endoscopy;  Laterality: N/A;  . ESOPHAGOGASTRODUODENOSCOPY (EGD) WITH PROPOFOL N/A 08/05/2018   Procedure: ESOPHAGOGASTRODUODENOSCOPY (EGD) WITH PROPOFOL;  Surgeon: Rush Landmark Telford Nab., MD;  Location: [facility];  Service: Gastroenterology;  Laterality: N/A;  . HERNIA REPAIR    . LEG SURGERY    . POLYPECTOMY  08/07/2018  Procedure: POLYPECTOMY;  Surgeon: Thornton Park, MD;  Location: [facility] ENDOSCOPY;  Service: Gastroenterology;;       Family History  Problem Relation Age of Onset  . Diabetes Mother   . Alcoholism Mother   . Emphysema Father   . Lung cancer Father     . Alcoholism Father     Social History   Tobacco Use  . Smoking status: Current Every Day Smoker    Packs/day: 1.00    Types: Cigarettes  . Smokeless tobacco: Never Used  Substance Use Topics  . Alcohol use: Yes    Comment: 5+ BEERS DAILY  . Drug use: Not Currently    Frequency: 3.0 times per week    Types: Cocaine    Home Medications Prior to Admission medications   Medication Sig Start Date End Date Taking? Authorizing Provider  folic acid (FOLVITE) 1 MG tablet Take 1 tablet (1 mg total) by mouth daily. 01/04/19   Desiree Hane, MD  Multiple Vitamin (MULTIVITAMIN WITH MINERALS) TABS tablet Take 1 tablet by mouth daily. 01/04/19   Desiree Hane, MD  pantoprazole (PROTONIX) 40 MG tablet Take 1 tablet (40 mg total) by mouth 2 (two) times daily. To reduce stomach acid 02/01/19 04/02/19  Fulp, Cammie, MD  thiamine 100 MG tablet Take 1 tablet (100 mg total) by mouth daily. 01/04/19   Oretha Milch D, MD  venlafaxine XR (EFFEXOR XR) 75 MG 24 hr capsule One every morning 02/01/19   Fulp, Cammie, MD  ferrous sulfate 325 (65 FE) MG tablet Take 1 tablet (325 mg total) by mouth daily with breakfast. Patient not taking: Reported on 09/23/2018 08/09/18 10/09/18  Elodia Florence., MD    Allergies    Tomato and Aspirin  Review of Systems   Review of Systems Ten systems reviewed and are negative for acute change, except as noted in the HPI.   Physical Exam Updated Vital Signs BP 107/75   Pulse 86   Temp 98.1 F (36.7 C) (Oral)   Resp 19   SpO2 97%   Physical Exam Vitals and nursing note reviewed.  Constitutional:      General: He is not in acute distress.    Appearance: He is not diaphoretic.  HENT:     Head: Normocephalic and atraumatic.  Eyes:     General: No scleral icterus.    Conjunctiva/sclera: Conjunctivae normal.  Cardiovascular:     Rate and Rhythm: Normal rate and regular rhythm.     Heart sounds: Normal heart sounds.  Pulmonary:     Effort: Pulmonary  effort is normal. No respiratory distress.     Breath sounds: Normal breath sounds.  Abdominal:     Palpations: Abdomen is soft.     Tenderness: There is generalized abdominal tenderness.  Musculoskeletal:     Cervical back: Normal range of motion and neck supple.  Skin:    General: Skin is warm and dry.  Neurological:     Mental Status: He is alert. He is disoriented.     Comments: Appears very intoxicated  Psychiatric:        Behavior: Behavior normal.     ED Results / Procedures / Treatments   Labs (all labs ordered are listed, but only abnormal results are displayed) Labs Reviewed  CBC WITH DIFFERENTIAL/PLATELET - Abnormal; Notable for the following components:      Result Value   RBC 3.39 (*)    Hemoglobin 7.3 (*)    HCT 24.4 (*)  MCV 72.0 (*)    MCH 21.5 (*)    MCHC 29.9 (*)    RDW 18.6 (*)    Platelets 139 (*)    All other components within normal limits  COMPREHENSIVE METABOLIC PANEL - Abnormal; Notable for the following components:   Glucose, Bld 101 (*)    BUN <5 (*)    Creatinine, Ser 0.58 (*)    Calcium 8.4 (*)    Total Protein 8.2 (*)    AST 104 (*)    Alkaline Phosphatase 129 (*)    All other components within normal limits  URINALYSIS, ROUTINE W REFLEX MICROSCOPIC - Abnormal; Notable for the following components:   Color, Urine STRAW (*)    Specific Gravity, Urine 1.002 (*)    Hgb urine dipstick SMALL (*)    All other components within normal limits  ETHANOL - Abnormal; Notable for the following components:   Alcohol, Ethyl (B) 439 (*)    All other components within normal limits  LIPASE, BLOOD    EKG None  Radiology CT ABDOMEN PELVIS W CONTRAST  Result Date: 04/28/2019 CLINICAL DATA:  Generalized abdominal pain EXAM: CT ABDOMEN AND PELVIS WITH CONTRAST TECHNIQUE: Multidetector CT imaging of the abdomen and pelvis was performed using the standard protocol following bolus administration of intravenous contrast. CONTRAST:  127mL OMNIPAQUE  IOHEXOL 300 MG/ML  SOLN COMPARISON:  01/01/2019 FINDINGS: Lower chest: Scarring in the lung bases.  No effusions. Hepatobiliary: Diffuse fatty infiltration of the liver. Gallbladder wall appears mildly thickened. No visible stones or biliary ductal dilatation. Pancreas: No focal abnormality or ductal dilatation. Spleen: No focal abnormality.  Normal size. Adrenals/Urinary Tract: No adrenal abnormality. No focal renal abnormality. No stones or hydronephrosis. Urinary bladder is unremarkable. Stomach/Bowel: Stomach, large and small bowel grossly unremarkable. Vascular/Lymphatic: Aortic atherosclerosis. No enlarged abdominal or pelvic lymph nodes. Reproductive: No visible focal abnormality. Other: There is stranding within the retroperitoneum in the anterior right pararenal space, posterior to the descending and transverse duodenum, around the IVC. This is similar to prior study. The adjacent pancreas and duodenum appear normal. Therefore this is of unknown etiology and may be chronic. No free fluid or free air. Musculoskeletal: No acute bony abnormality. IMPRESSION: Diffuse fatty infiltration of the liver. Mild gallbladder wall thickening, less prominent than noted on prior CT. Stranding/fluid in the right retroperitoneum/anterior pararenal space as described above. This is similar prior study and does not appear to surround or involve the adjacent pancreas duodenum which appear normal. Therefore this felt to be a chronic finding/process. No definite acute process in the abdomen or pelvis. Electronically Signed   By: Rolm Baptise M.D.   On: 04/28/2019 18:29    Procedures Procedures (including critical care time)  Medications Ordered in ED Medications  sodium chloride (PF) 0.9 % injection (has no administration in time range)  ondansetron (ZOFRAN) injection 4 mg (4 mg Intravenous Given 04/28/19 1901)  iohexol (OMNIPAQUE) 300 MG/ML solution 100 mL (100 mLs Intravenous Contrast Given 04/28/19 1804)  pantoprazole  (PROTONIX) injection 40 mg (40 mg Intravenous Given 04/28/19 1902)    ED Course  I have reviewed the triage vital signs and the nursing notes.  Pertinent labs & imaging results that were available during my care of the patient were reviewed by me and considered in my medical decision making (see chart for details).    MDM Rules/Calculators/A&P                      54 year old male with  chronic alcohol abuse.  Here with nausea vomiting and diffuse abdominal pain.Differential diagnosis of epigastric pain includes: Functional or nonulcer dyspepsia PUD, GERD, Gastritis, (NSAIDs, alcohol, stress, H. pylori, pernicious anemia), pancreatitis or pancreatic cancer, overeating indigestion (high-fat foods, coffee), drugs (aspirin, antibiotics (eg, macrolides, metronidazole), corticosteroids, digoxin, narcotics, theophylline), gastroparesis, lactose intolerance, malabsorption gastric cancer, parasitic infection, (Giardia, Strongyloides, Ascaris) cholelithiasis, choledocholithiasis, or cholangitis, ACS, pericarditis, pneumonia, abdominal hernia, pregnancy, intestinal ischemia, esophageal rupture, gastric volvulus, hepatitis. ersonally reviewed the patient's labs which shows urine without abnormality.  Pancytopenia likely related to alcoholism and marrow suppression.  CMP shows elevated alkaline phosphatase and ALT.  No other acute abnormalities lipase is within normal limits.  Alcohol level notably for 39.  I personally reviewed the patient's CT abdomen and pelvisWhich shows retroperitoneal stranding in the anterior right pararenal space which appears to be chronic.  No evidence of acute pancreatitis or other significant abnormality on my interpretation.  Patient currently sleeping.  Will need to improve his sobriety and be able to walk prior to discharge.  I have given signout to [place] Final Clinical Impression(s) / ED Diagnoses Final diagnoses:  None    Rx / DC Orders ED Discharge Orders    None        Margarita Mail, PA-C 04/28/19 2040    Lacretia Leigh, MD 05/01/19 [phone]

## 2019-04-29 LAB — POC OCCULT BLOOD, ED: Fecal Occult Bld: NEGATIVE

## 2019-04-29 LAB — RESPIRATORY PANEL BY RT PCR (FLU A&B, COVID)
Influenza A by PCR: NEGATIVE
Influenza B by PCR: NEGATIVE
SARS Coronavirus 2 by RT PCR: NEGATIVE

## 2019-04-29 MED ORDER — PANTOPRAZOLE SODIUM 40 MG PO TBEC
40.0000 mg | DELAYED_RELEASE_TABLET | Freq: Two times a day (BID) | ORAL | Status: DC
  Administered 2019-04-29: 40 mg via ORAL
  Filled 2019-04-29: qty 1

## 2019-04-29 MED ORDER — TAB-A-VITE/IRON PO TABS
1.0000 | ORAL_TABLET | Freq: Every day | ORAL | Status: DC
  Filled 2019-04-29: qty 1

## 2019-04-29 MED ORDER — ONDANSETRON HCL 4 MG PO TABS: 4.0000 mg | ORAL_TABLET | Freq: Three times a day (TID) | ORAL | Status: DC | PRN

## 2019-04-29 MED ORDER — LORAZEPAM 2 MG/ML IJ SOLN: 0.0000 mg | Freq: Four times a day (QID) | INTRAMUSCULAR | Status: DC

## 2019-04-29 MED ORDER — ALUM & MAG HYDROXIDE-SIMETH 200-200-20 MG/5ML PO SUSP: 30.0000 mL | Freq: Four times a day (QID) | ORAL | Status: DC | PRN

## 2019-04-29 MED ORDER — LORAZEPAM 1 MG PO TABS
0.0000 mg | ORAL_TABLET | Freq: Four times a day (QID) | ORAL | Status: DC
  Administered 2019-04-29: 2 mg via ORAL
  Filled 2019-04-29: qty 2

## 2019-04-29 MED ORDER — LORAZEPAM 2 MG/ML IJ SOLN: 0.0000 mg | Freq: Two times a day (BID) | INTRAMUSCULAR | Status: DC

## 2019-04-29 MED ORDER — ACETAMINOPHEN 325 MG PO TABS: 650.0000 mg | ORAL_TABLET | ORAL | Status: DC | PRN

## 2019-04-29 MED ORDER — LORAZEPAM 1 MG PO TABS: 0.0000 mg | ORAL_TABLET | Freq: Two times a day (BID) | ORAL | Status: DC

## 2019-04-29 NOTE — Progress Notes (Signed)
CSW faxed outpatient resources to [facility] ED for pt.  Darletta Moll MSW, [place] Worker Disposition  [facility] Ph: [phone] Fax: [phone]

## 2019-04-29 NOTE — Consult Note (Signed)
Telepsych Consultation   Reason for Consult:  Alcohol disorder Referring Physician: EPD Location of Patient: [id number] Location of Provider: [facility]  Patient Identification: Rufe Rutkowski MRN:  [id number] Principal Diagnosis: <principal problem not specified> Diagnosis:  Active Problems:   * No active hospital problems. *   Total Time spent with patient: 15 minutes  Subjective:   Shephen Lashlee is a 54 y.o. male was evaluated via teleassessment.  He is awake, alert and oriented x3.  Patient reports " some depression". " I do not remember saying those things" reported last suicide attempt was a "few years ago, but I do not feel that way."  He is denying suicidal or homicidal ideations.  Denies auditory or visual hallucinations.  Patient was offered substance abuse resources, patient declined stating " I do not have a problem."  Denies that he is followed by psychiatry and/or therapist currently.  BAL on admission 439.  Case staffed with attending psychiatrist [place].  TTS to provide additional outpatient resources.  Support,encouragement and reassurance was provided.  HPI:  Per admission assessment note:  Jarron Slice is an 54 y.o. divorced male who presents unaccompanied to [place] ED reporting alcohol intoxication, depressive symptoms and suicidal ideation. Pt has a long history of alcohol use and depression and arrived with a blood alcohol level = 439. Pt says he called EMS because he is experiencing chest and abdominal pain and feels suicidal. He reports current suicidal ideation with plan to either cut his wrists with a knife or "drink myself to death." He reports one previous suicide attempt several years ago by overdosing on lithium.Pt acknowledges symptoms including social withdrawal, loss of interest in usual pleasures, fatigue, irritability, decreased concentration, decreased appetite and feelings of worthlessness and hopelessness.He reports vague auditory  hallucinations of hearing voices calling his name or knocking on his door when he awakes. He denies current homicidal ideation or history of violence.  Past Psychiatric History:   Risk to Self: Suicidal Ideation: Yes-Currently Present Suicidal Intent: Yes-Currently Present Is patient at risk for suicide?: Yes Suicidal Plan?: Yes-Currently Present Specify Current Suicidal Plan: Cut his wrist with knife or "drink myself to death" Access to Means: Yes Specify Access to Suicidal Means: Access to knives What has been your use of drugs/alcohol within the last 12 months?: Pt reports heavy alcohol use How many times?: 1(Overdosed on lithium) Other Self Harm Risks: Severe alcohol use Triggers for Past Attempts: Other (Comment)(Intoxication) Intentional Self Injurious Behavior: None Risk to Others: Homicidal Ideation: No Thoughts of Harm to Others: No Current Homicidal Intent: No Current Homicidal Plan: No Access to Homicidal Means: No Identified Victim: None History of harm to others?: No Assessment of Violence: None Noted Violent Behavior Description: Pt denies history of violence Does patient have access to weapons?: No Criminal Charges Pending?: No Does patient have a court date: No Prior Inpatient Therapy: Prior Inpatient Therapy: Yes Prior Therapy Dates: Multiple admits Prior Therapy Facilty/Provider(s): Pt cannot remember Reason for Treatment: Depression, SA Prior Outpatient Therapy: Prior Outpatient Therapy: Yes Prior Therapy Dates: 2019 Prior Therapy Facilty/Provider(s): [facility] Reason for Treatment: Depression Does patient have an ACCT team?: No Does patient have Intensive In-House Services?  : No Does patient have [facility] services? : No Does patient have P4CC services?: No  Past Medical History:  Past Medical History:  Diagnosis Date  . Alcoholic hepatitis   . Depression   . ETOH abuse   . Gastric bleed 08/2018  . Hypertension   . Pancreatitis   . Seizures (  [place])    . Suicidal behavior     Past Surgical History:  Procedure Laterality Date  . BIOPSY  08/05/2018   Procedure: BIOPSY;  Surgeon: Rush Landmark Telford Nab., MD;  Location: [place];  Service: Gastroenterology;;  . BIOPSY  08/07/2018   Procedure: BIOPSY;  Surgeon: Thornton Park, MD;  Location: [facility] ENDOSCOPY;  Service: Gastroenterology;;  . BIOPSY  01/02/2019   Procedure: BIOPSY;  Surgeon: Gatha Mayer, MD;  Location: [facility] ENDOSCOPY;  Service: Endoscopy;;  . COLONOSCOPY WITH PROPOFOL N/A 08/07/2018   Procedure: COLONOSCOPY WITH PROPOFOL;  Surgeon: Thornton Park, MD;  Location: [place];  Service: Gastroenterology;  Laterality: N/A;  . ESOPHAGOGASTRODUODENOSCOPY N/A 01/02/2019   Procedure: ESOPHAGOGASTRODUODENOSCOPY (EGD);  Surgeon: Gatha Mayer, MD;  Location: [facility];  Service: Endoscopy;  Laterality: N/A;  . ESOPHAGOGASTRODUODENOSCOPY (EGD) WITH PROPOFOL N/A 11/02/2016   Procedure: ESOPHAGOGASTRODUODENOSCOPY (EGD) WITH PROPOFOL;  Surgeon: Carol Ada, MD;  Location: [facility] ENDOSCOPY;  Service: Endoscopy;  Laterality: N/A;  . ESOPHAGOGASTRODUODENOSCOPY (EGD) WITH PROPOFOL N/A 08/05/2018   Procedure: ESOPHAGOGASTRODUODENOSCOPY (EGD) WITH PROPOFOL;  Surgeon: Rush Landmark Telford Nab., MD;  Location: [place];  Service: Gastroenterology;  Laterality: N/A;  . HERNIA REPAIR    . LEG SURGERY    . POLYPECTOMY  08/07/2018   Procedure: POLYPECTOMY;  Surgeon: Thornton Park, MD;  Location: [facility] ENDOSCOPY;  Service: Gastroenterology;;   Family History:  Family History  Problem Relation Age of Onset  . Diabetes Mother   . Alcoholism Mother   . Emphysema Father   . Lung cancer Father   . Alcoholism Father    Family Psychiatric  History:  Social History:  Social History   Substance and Sexual Activity  Alcohol Use Yes   Comment: 5+ BEERS DAILY     Social History   Substance and Sexual Activity  Drug Use Not Currently  . Frequency: 3.0 times per week  . Types: Cocaine     Social History   Socioeconomic History  . Marital status: Divorced    Spouse name: Not on file  . Number of children: Not on file  . Years of education: Not on file  . Highest education level: Not on file  Occupational History  . Not on file  Tobacco Use  . Smoking status: Current Every Day Smoker    Packs/day: 1.00    Types: Cigarettes  . Smokeless tobacco: Never Used  Substance and Sexual Activity  . Alcohol use: Yes    Comment: 5+ BEERS DAILY  . Drug use: Not Currently    Frequency: 3.0 times per week    Types: Cocaine  . Sexual activity: Not Currently  Other Topics Concern  . Not on file  Social History Narrative  . Not on file   Social Determinants of Health   Financial Resource Strain:   . Difficulty of Paying Living Expenses: Not on file  Food Insecurity:   . Worried About Charity fundraiser in the Last Year: Not on file  . Ran Out of Food in the Last Year: Not on file  Transportation Needs:   . Lack of Transportation (Medical): Not on file  . Lack of Transportation (Non-Medical): Not on file  Physical Activity:   . Days of Exercise per Week: Not on file  . Minutes of Exercise per Session: Not on file  Stress:   . Feeling of Stress : Not on file  Social Connections:   . Frequency of Communication with Friends and Family: Not on file  . Frequency of  Social Gatherings with Friends and Family: Not on file  . Attends Religious Services: Not on file  . Active Member of Clubs or Organizations: Not on file  . Attends Archivist Meetings: Not on file  . Marital Status: Not on file   Additional Social History:    Allergies:   Allergies  Allergen Reactions  . Tomato Shortness Of Breath and Nausea And Vomiting  . Aspirin Nausea And Vomiting    Labs:  Results for orders placed or performed during the hospital encounter of 04/28/19 (from the past 48 hour(s))  Ethanol     Status: Abnormal   Collection Time: 04/28/19  4:14 PM  Result Value Ref  Range   Alcohol, Ethyl (B) 439 (HH) <10 mg/dL    Comment: CRITICAL RESULT CALLED TO, READ BACK BY AND VERIFIED WITH: A.BALEU,RN [id number] @1701  BY V.WILKINS (NOTE) Lowest detectable limit for serum alcohol is 10 mg/dL. For medical purposes only. Performed at [facility], [place] [address], [place], [place] [address]   CBC with Differential     Status: Abnormal   Collection Time: 04/28/19  4:19 PM  Result Value Ref Range   WBC 4.8 4.0 - 10.5 K/uL   RBC 3.39 (L) 4.22 - 5.81 MIL/uL   Hemoglobin 7.3 (L) 13.0 - 17.0 g/dL    Comment: Reticulocyte Hemoglobin testing may be clinically indicated, consider ordering this additional test [id number]    HCT 24.4 (L) 39.0 - 52.0 %   MCV 72.0 (L) 80.0 - 100.0 fL   MCH 21.5 (L) 26.0 - 34.0 pg   MCHC 29.9 (L) 30.0 - 36.0 g/dL   RDW 18.6 (H) 11.5 - 15.5 %   Platelets 139 (L) 150 - 400 K/uL   nRBC 0.0 0.0 - 0.2 %   Neutrophils Relative % 42 %   Neutro Abs 2.1 1.7 - 7.7 K/uL   Lymphocytes Relative 39 %   Lymphs Abs 1.9 0.7 - 4.0 K/uL   Monocytes Relative 15 %   Monocytes Absolute 0.7 0.1 - 1.0 K/uL   Eosinophils Relative 2 %   Eosinophils Absolute 0.1 0.0 - 0.5 K/uL   Basophils Relative 2 %   Basophils Absolute 0.1 0.0 - 0.1 K/uL   Immature Granulocytes 0 %   Abs Immature Granulocytes 0.01 0.00 - 0.07 K/uL    Comment: Performed at [facility], [place] [address] [place], [place] [address]  Comprehensive metabolic panel     Status: Abnormal   Collection Time: 04/28/19  4:19 PM  Result Value Ref Range   Sodium 135 135 - 145 mmol/L   Potassium 3.6 3.5 - 5.1 mmol/L   Chloride 102 98 - 111 mmol/L   CO2 23 22 - 32 mmol/L   Glucose, Bld 101 (H) 70 - 99 mg/dL   BUN <5 (L) 6 - 20 mg/dL   Creatinine, Ser 0.58 (L) 0.61 - 1.24 mg/dL   Calcium 8.4 (L) 8.9 - 10.3 mg/dL   Total Protein 8.2 (H) 6.5 - 8.1 g/dL   Albumin 3.8 3.5 - 5.0 g/dL   AST 104 (H) 15 - 41 U/L   ALT 26 0 - 44 U/L   Alkaline Phosphatase 129 (H) 38 -  126 U/L   Total Bilirubin 0.9 0.3 - 1.2 mg/dL   GFR calc non Af Amer >60 >60 mL/min   GFR calc Af Amer >60 >60 mL/min   Anion gap 10 5 - 15    Comment: Performed at [facility], [address] [place], [place] [address]  Lipase, blood     Status: None   Collection Time: 04/28/19  4:19 PM  Result Value Ref Range   Lipase 50 11 - 51 U/L    Comment: Performed at [facility], [place] [address] [place], [place] [address]  Urinalysis, Routine w reflex microscopic     Status: Abnormal   Collection Time: 04/28/19  4:23 PM  Result Value Ref Range   Color, Urine STRAW (A) YELLOW   APPearance CLEAR CLEAR   Specific Gravity, Urine 1.002 (L) 1.005 - 1.030   pH 5.0 5.0 - 8.0   Glucose, UA NEGATIVE NEGATIVE mg/dL   Hgb urine dipstick SMALL (A) NEGATIVE   Bilirubin Urine NEGATIVE NEGATIVE   Ketones, ur NEGATIVE NEGATIVE mg/dL   Protein, ur NEGATIVE NEGATIVE mg/dL   Nitrite NEGATIVE NEGATIVE   Leukocytes,Ua NEGATIVE NEGATIVE   RBC / HPF 0-5 0 - 5 RBC/hpf   WBC, UA 0-5 0 - 5 WBC/hpf   Bacteria, UA NONE SEEN NONE SEEN    Comment: Performed at [facility], [place] [address] [place],  [address]  POC occult blood, ED     Status: None   Collection Time: 04/29/19  1:44 AM  Result Value Ref Range   Fecal Occult Bld NEGATIVE NEGATIVE  Respiratory Panel by RT PCR (Flu A&B, Covid) - Nasopharyngeal Swab     Status: None   Collection Time: 04/29/19  2:09 AM   Specimen: Nasopharyngeal Swab  Result Value Ref Range   SARS Coronavirus 2 by RT PCR NEGATIVE NEGATIVE    Comment: (NOTE) SARS-CoV-2 target nucleic acids are NOT DETECTED. The SARS-CoV-2 RNA is generally detectable in upper respiratoy specimens during the acute phase of infection. The lowest concentration of SARS-CoV-2 viral copies this assay can detect is 131 copies/mL. A negative result does not preclude SARS-Cov-2 infection and should not be used as the sole basis for treatment  or other patient management decisions. A negative result may occur with  improper specimen collection/handling, submission of specimen other than nasopharyngeal swab, presence of viral mutation(s) within the areas targeted by this assay, and inadequate number of viral copies (<131 copies/mL). A negative result must be combined with clinical observations, patient history, and epidemiological information. The expected result is Negative. Fact Sheet for Patients:  PinkCheek.be Fact Sheet for Healthcare Providers:  GravelBags.it This test is not yet ap proved or cleared by the [place] FDA and  has been authorized for detection and/or diagnosis of SARS-CoV-2 by FDA under an Emergency Use Authorization (EUA). This EUA will remain  in effect (meaning this test can be used) for the duration of the COVID-19 declaration under Section 564(b)(1) of the Act, 21 U.S.C. section 360bbb-3(b)(1), unless the authorization is terminated or revoked sooner.    Influenza A by PCR NEGATIVE NEGATIVE   Influenza B by PCR NEGATIVE NEGATIVE    Comment: (NOTE) The Xpert Xpress SARS-CoV-2/FLU/RSV assay is intended as an aid in  the diagnosis of influenza from Nasopharyngeal swab specimens and  should not be used as a sole basis for treatment. Nasal washings and  aspirates are unacceptable for Xpert Xpress SARS-CoV-2/FLU/RSV  testing. Fact Sheet for Patients: PinkCheek.be Fact Sheet for Healthcare Providers: GravelBags.it This test is not yet approved or cleared by the [place] FDA and  has been authorized for detection and/or diagnosis of SARS-CoV-2 by  FDA under an Emergency Use Authorization (EUA). This EUA will remain  in effect (meaning this test  can be used) for the duration of the  Covid-19 declaration under Section 564(b)(1) of the Act, 21  U.S.C. section 360bbb-3(b)(1), unless  the authorization is  terminated or revoked. Performed at [facility], [place] [address] [place], [place] [address]     Medications:  Current Facility-Administered Medications  Medication Dose Route Frequency Provider Last Rate Last Admin  . acetaminophen (TYLENOL) tablet 650 mg  650 mg Oral Q4H PRN McDonald, Mia A, PA-C      . alum & mag hydroxide-simeth (MAALOX/MYLANTA) 200-200-20 MG/5ML suspension 30 mL  30 mL Oral Q6H PRN McDonald, Mia A, PA-C      . LORazepam (ATIVAN) injection 0-4 mg  0-4 mg Intravenous Q6H McDonald, Mia A, PA-C   Stopped at 04/29/19 1020   Or  . LORazepam (ATIVAN) tablet 0-4 mg  0-4 mg Oral Q6H McDonald, Mia A, PA-C   2 mg at 04/29/19 0336  . [START ON 05/01/2019] LORazepam (ATIVAN) injection 0-4 mg  0-4 mg Intravenous Q12H McDonald, Mia A, PA-C       Or  . [START ON 05/01/2019] LORazepam (ATIVAN) tablet 0-4 mg  0-4 mg Oral Q12H McDonald, Mia A, PA-C      . multivitamins with iron tablet 1 tablet  1 tablet Oral Daily McDonald, Mia A, PA-C      . ondansetron (ZOFRAN) tablet 4 mg  4 mg Oral Q8H PRN McDonald, Mia A, PA-C      . pantoprazole (PROTONIX) EC tablet 40 mg  40 mg Oral BID McDonald, Mia A, PA-C   40 mg at 04/29/19 1034   Current Outpatient Medications  Medication Sig Dispense Refill  . folic acid (FOLVITE) 1 MG tablet Take 1 tablet (1 mg total) by mouth daily. 30 tablet 0  . Multiple Vitamin (MULTIVITAMIN WITH MINERALS) TABS tablet Take 1 tablet by mouth daily. 30 tablet 0  . pantoprazole (PROTONIX) 40 MG tablet Take 1 tablet (40 mg total) by mouth 2 (two) times daily. To reduce stomach acid 180 tablet 1  . thiamine 100 MG tablet Take 1 tablet (100 mg total) by mouth daily. 30 tablet 0  . venlafaxine XR (EFFEXOR XR) 75 MG 24 hr capsule One every morning 30 capsule 2    Musculoskeletal:   Psychiatric Specialty Exam: Physical Exam  Vitals reviewed. Psychiatric: He has a normal mood and affect. His behavior is normal.    Review of  Systems  Psychiatric/Behavioral: The patient is nervous/anxious.     Blood pressure (!) 155/82, pulse 98, temperature 97.8 F (36.6 C), resp. rate 18, SpO2 97 %.There is no height or weight on file to calculate BMI.  General Appearance: Casual  Eye Contact:  Good  Speech:  Clear and Coherent  Volume:  Normal  Mood:  Anxious  Affect:  Congruent  Thought Process:  Coherent  Orientation:  Full (Time, Place, and Person)  Thought Content:  Logical  Suicidal Thoughts:  No  Homicidal Thoughts:  No  Memory:  Immediate;   Fair Recent;   Fair  Judgement:  Fair  Insight:  Fair  Psychomotor Activity:  Normal  Concentration:  Concentration: Fair  Recall:  [employer] of Knowledge:  Fair  Language:  Fair  Akathisia:  No  Handed:  Right  AIMS (if indicated):     Assets:  Communication Skills Desire for Improvement Resilience Social Support  ADL's:  Intact  Cognition:  WNL  Sleep:      NP spoke to MD [facility] regarding discharge disposition recommendation -  TTS to provided additional outpatient resources   Disposition: No evidence of imminent risk to self or others at present.   Patient does not meet criteria for psychiatric inpatient admission. Supportive therapy provided about ongoing stressors. Refer to IOP. Discussed crisis plan, support from social network, calling 911, coming to the Emergency Department, and calling Suicide Hotline.  This service was provided via telemedicine using a 2-way, interactive audio and video technology.  Names of all persons participating in this telemedicine service and their role in this encounter. Name: Ok Edwards Role: patient   Name: T.Rielynn Trulson Role: NP           Derrill [place], NP 04/29/2019 11:26 AM

## 2019-04-29 NOTE — ED Provider Notes (Addendum)
54 year old male received in signout from [place] at shift change for metabolization of ethanol and reassessment as patient may require TTS consult. Patient originally seen by PA Harris earlier in the day pending . Per her HPI:  "Barry Moore is a 54 y.o. male who presents via EMS for alcohol intoxication and abdominal pain.  The patient is very obviously intoxicated and there is a level 5 caveat.  Essentially patient is complaining of abdominal pain nausea vomiting and diarrhea.  He has been drinking alcohol tonight but states "apparently not enough to get rid of this pain."  He is unable to provide any other history at this time."  He denies any recent black or bloody stools.  He has generally been complaining of diffuse abdominal pain, nausea, vomiting, and diarrhea.  He continues to endorse suicidal thoughts.  Reports that he had a plan to cut himself with a knife.  Reports he brought the knife with him to the emergency department, but can no longer find it on his person.   Physical Exam  BP 98/73   Pulse 100   Temp 98.1 F (36.7 C) (Oral)   Resp 13   SpO2 97%   Physical Exam Vitals and nursing note reviewed. Exam conducted with a chaperone present.  Constitutional:      Appearance: He is well-developed.     Comments: Nontoxic-appearing. Patient appears well groomed.  HENT:     Head: Normocephalic.  Eyes:     Conjunctiva/sclera: Conjunctivae normal.  Cardiovascular:     Rate and Rhythm: Normal rate and regular rhythm.     Heart sounds: No murmur.  Pulmonary:     Effort: Pulmonary effort is normal.  Abdominal:     General: There is no distension.     Palpations: Abdomen is soft. There is no mass.     Tenderness: There is abdominal tenderness. There is no right CVA tenderness, left CVA tenderness, guarding or rebound.     Hernia: No hernia is present.     Comments: Mild diffuse tenderness palpation throughout the abdomen.  Abdomen is soft and nondistended.  No rebound or  guarding.  Negative Murphy sign.  No tenderness over McBurney's point.  No CVA tenderness bilaterally.  Genitourinary:    Comments: Chaperoned exam.  Normal rectal tone.  Small amount of brown stool noted on DRE.  No obvious fissures.  No external hemorrhoids. Musculoskeletal:     Cervical back: Neck supple.     Right lower leg: No edema.     Left lower leg: No edema.  Skin:    General: Skin is warm and dry.  Neurological:     Mental Status: He is alert.     Comments: Speech is not slurred.  Speaks in complete, fluent sentences.  Ambulatory without ataxia.  Psychiatric:        Behavior: Behavior normal.     ED Course/Procedures     Procedures  MDM    54 year old male received at signout from [place].  Patient was initially seen by PA Harris earlier in the day.  He presented with abdominal pain, nausea, and vomiting and was found to be acutely intoxicated.  This patient is well-known to me as I have previously evaluated him multiple times in the ER.  This is also his eighth visit in the last 6 months.  Hemoglobin 7.3.  This is a 2 g drop from labs ordered in October.  He denies any melena or hematochezia.  However, he does have a  history of prior upper GI bleeds.  Point-of-care Hemoccult test was negative.  I suspect this is chronic secondary to bone marrow suppression from chronic alcohol use.  This was discussed with Dr. Nicholes Stairs who is in agreement.  Given that he is having no active bleeding, will order iron supplementation and multivitamin in the ER to stimulate hemopoietic production.  He has already received thiamine.  He does not meet medical admission criteria at this time.  CIWA precautions have been ordered.  No evidence of withdrawal on my exam, the patient is now clinically sober.  He continues to endorse SI with a plan to kill himself with a knife.  Reports that he brought the knife with him to the ER, but it is no longer on his person.  Advised the patient that it is  probably locked up by ER staff for safety reasons.  Denies HI or auditory or visual hallucinations.  TTS was consulted and recommended overnight observation with re-evaluation with psychiatry in the morning.  Psych hold orders and home medications orders have been placed.  Please see psych team notes for further documentation of care/dispo. Pt stable at time of med clearance.        Joanne Gavel, PA-C 04/29/19 0432    Joanne Gavel, PA-C 04/29/19 [id number]    Randal Buba, April, MD 04/29/19 [id number]

## 2019-04-29 NOTE — ED Provider Notes (Signed)
Patient seen on morning rounds.    No acute events reported overnight per nursing staff.  Patient is currently comfortable without specific acute complaint.  Psychiatric assessment is pending.   Valarie Merino, MD 04/29/19 [phone]

## 2019-04-29 NOTE — BH Assessment (Signed)
Tele Assessment Note   Patient Name: Barry Moore MRN: [id number] Referring Physician: Joline Maxcy, PA-C Location of Patient: [facility], [phone] Location of Provider: [place] is an 54 y.o. divorced male who presents unaccompanied to [place] ED reporting alcohol intoxication, depressive symptoms and suicidal ideation. Pt has a long history of alcohol use and depression and arrived with a blood alcohol level = 439. Pt says he called EMS because he is experiencing chest and abdominal pain and feels suicidal. He reports current suicidal ideation with plan to either cut his wrists with a knife or "drink myself to death." He reports one previous suicide attempt several years ago by overdosing on lithium. Pt acknowledges symptoms including social withdrawal, loss of interest in usual pleasures, fatigue, irritability, decreased concentration, decreased appetite and feelings of worthlessness and hopelessness. He reports vague auditory hallucinations of hearing voices calling his name or knocking on his door when he awakes. He denies current homicidal ideation or history of violence.  Pt reports drinking 4-6 cans of 40-ounce beer daily. He has a history of using cocaine but he denies recent use and urine drug screen is negative. He reports a history of alcohol withdrawal symptoms including tremors, sweats, nausea, dry heaves, blackouts and seizures. Pt says he thinks he had a seizure today.    Pt says he lives alone and believes he is going to be evicted from his residence. He cannot identify any family or friends who are supportive. Pt has a presented to local ED several times recently. He denies current legal problems. He denies access to firearms. Pt says he cannot remember the last time he was psychiatrically hospitalized. He says he has received outpatient treatment in the past through [facility] but currently has no outpatient providers.    Pt says there is no  one to contact for collateral information.   Pt is dressed in hospital scrubs, alert and oriented x4. Pt speaks in a slightly slurred tone, at moderate volume and normal pace. Motor behavior appears normal. Eye contact is good. Pt's mood is depressed, affect is congruent with mood. Thought process is coherent and relevant. There is no indication Pt is currently responding to internal stimuli or experiencing delusional thought content. Pt was cooperative throughout assessment. He says he is willing to sign voluntarily into a psychiatric facility.  Diagnosis:  F33.2 Major depressive disorder, Recurrent episode, Severe F10.20 Alcohol use disorder, Severe  Past Medical History:  Past Medical History:  Diagnosis Date  . Alcoholic hepatitis   . Depression   . ETOH abuse   . Gastric bleed 08/2018  . Hypertension   . Pancreatitis   . Seizures ([place])   . Suicidal behavior     Past Surgical History:  Procedure Laterality Date  . BIOPSY  08/05/2018   Procedure: BIOPSY;  Surgeon: Rush Landmark Telford Nab., MD;  Location: [place];  Service: Gastroenterology;;  . BIOPSY  08/07/2018   Procedure: BIOPSY;  Surgeon: Thornton Park, MD;  Location[facility] ENDOSCOPY;  Service: Gastroenterology;;  . BIOPSY  01/02/2019   Procedure: BIOPSY;  Surgeon: Gatha Mayer, MD;  Location: [facility] ENDOSCOPY;  Service: Endoscopy;;  . COLONOSCOPY WITH PROPOFOL N/A 08/07/2018   Procedure: COLONOSCOPY WITH PROPOFOL;  Surgeon: Thornton Park, MD;  Location: [place];  Service: Gastroenterology;  Laterality: N/A;  . ESOPHAGOGASTRODUODENOSCOPY N/A 01/02/2019   Procedure: ESOPHAGOGASTRODUODENOSCOPY (EGD);  Surgeon: Gatha Mayer, MD;  Location: [facility];  Service: Endoscopy;  Laterality: N/A;  . ESOPHAGOGASTRODUODENOSCOPY (EGD) WITH  PROPOFOL N/A 11/02/2016   Procedure: ESOPHAGOGASTRODUODENOSCOPY (EGD) WITH PROPOFOL;  Surgeon: Carol Ada, MD;  Location: [facility] ENDOSCOPY;  Service: Endoscopy;  Laterality: N/A;  .  ESOPHAGOGASTRODUODENOSCOPY (EGD) WITH PROPOFOL N/A 08/05/2018   Procedure: ESOPHAGOGASTRODUODENOSCOPY (EGD) WITH PROPOFOL;  Surgeon: Rush Landmark Telford Nab., MD;  Location: [place];  Service: Gastroenterology;  Laterality: N/A;  . HERNIA REPAIR    . LEG SURGERY    . POLYPECTOMY  08/07/2018   Procedure: POLYPECTOMY;  Surgeon: Thornton Park, MD;  Location: [facility] ENDOSCOPY;  Service: Gastroenterology;;    Family History:  Family History  Problem Relation Age of Onset  . Diabetes Mother   . Alcoholism Mother   . Emphysema Father   . Lung cancer Father   . Alcoholism Father     Social History:  reports that he has been smoking cigarettes. He has been smoking about 1.00 pack per day. He has never used smokeless tobacco. He reports current alcohol use. He reports previous drug use. Frequency: 3.00 times per week. Drug: Cocaine.  Additional Social History:  Alcohol / Drug Use Pain Medications: Denies abuse Prescriptions: Denies abuse Over the Counter: Denies abuse History of alcohol / drug use?: Yes Longest period of sobriety (when/how long): 3.5 years while incarcerated Negative Consequences of Use: Financial, Scientist, research (physical sciences), Personal relationships, Work / School Withdrawal Symptoms: Nausea / Vomiting, Seizures, Blackouts, Sweats, DTs Onset of Seizures: unknown Date of most recent seizure: 1 month ago Substance #1 Name of Substance 1: Alcohol 1 - Age of First Use: Adolescent 1 - Amount (size/oz): 4-6 cans of 40-ounce beer 1 - Frequency: Daily 1 - Duration: Ongoing for years 1 - Last Use / Amount: 04/29/2019  CIWA: CIWA-Ar BP: 98/73 Pulse Rate: 100 Nausea and Vomiting: no nausea and no vomiting Tactile Disturbances: none Tremor: not visible, but can be felt fingertip to fingertip Auditory Disturbances: not present Paroxysmal Sweats: no sweat visible Visual Disturbances: not present Anxiety: no anxiety, at ease Headache, Fullness in Head: none present Agitation: normal  activity Orientation and Clouding of Sensorium: oriented and can do serial additions CIWA-Ar Total: 1 COWS:    Allergies:  Allergies  Allergen Reactions  . Tomato Shortness Of Breath and Nausea And Vomiting  . Aspirin Nausea And Vomiting    Home Medications: (Not in a hospital admission)   OB/GYN Status:  No LMP for male patient.  General Assessment Data Location of Assessment: [facility] ED TTS Assessment: In system Is this a Tele or Face-to-Face Assessment?: Tele Assessment Is this an Initial Assessment or a Re-assessment for this encounter?: Initial Assessment Patient Accompanied by:: N/A Language Other than English: No Living Arrangements: Other (Comment)(In process of being evicted) What gender do you identify as?: Male Marital status: Divorced [place] name: NA Pregnancy Status: No Living Arrangements: Alone Can pt return to current living arrangement?: Yes Admission Status: Voluntary Is patient capable of signing voluntary admission?: Yes Referral Source: Self/Family/Friend Insurance type: Self-pay     Crisis Care Plan Living Arrangements: Alone Legal Guardian: Other:(Self) Name of Psychiatrist: None Name of Therapist: None  Education Status Is patient currently in school?: No Is the patient employed, unemployed or receiving disability?: Unemployed  Risk to self with the past 6 months Suicidal Ideation: Yes-Currently Present Has patient been a risk to self within the past 6 months prior to admission? : Yes Suicidal Intent: Yes-Currently Present Has patient had any suicidal intent within the past 6 months prior to admission? : Yes Is patient at risk for suicide?: Yes Suicidal Plan?: Yes-Currently Present Has patient  had any suicidal plan within the past 6 months prior to admission? : Yes Specify Current Suicidal Plan: Cut his wrist with knife or "drink myself to death" Access to Means: Yes Specify Access to Suicidal Means: Access to knives What has been your use  of drugs/alcohol within the last 12 months?: Pt reports heavy alcohol use Previous Attempts/Gestures: Yes How many times?: 1(Overdosed on lithium) Other Self Harm Risks: Severe alcohol use Triggers for Past Attempts: Other (Comment)(Intoxication) Intentional Self Injurious Behavior: None Family Suicide History: No Recent stressful life event(s): Financial Problems, Other (Comment)(Being evicted) Persecutory voices/beliefs?: No Depression: Yes Depression Symptoms: Despondent, Isolating, Fatigue, Guilt, Loss of interest in usual pleasures, Feeling worthless/self pity, Feeling angry/irritable Substance abuse history and/or treatment for substance abuse?: Yes Suicide prevention information given to non-admitted patients: Not applicable  Risk to Others within the past 6 months Homicidal Ideation: No Does patient have any lifetime risk of violence toward others beyond the six months prior to admission? : No Thoughts of Harm to Others: No Current Homicidal Intent: No Current Homicidal Plan: No Access to Homicidal Means: No Identified Victim: None History of harm to others?: No Assessment of Violence: None Noted Violent Behavior Description: Pt denies history of violence Does patient have access to weapons?: No Criminal Charges Pending?: No Does patient have a court date: No Is patient on probation?: No  Psychosis Hallucinations: None noted Delusions: None noted  Mental Status Report Appearance/Hygiene: In scrubs Eye Contact: Good Motor Activity: Unremarkable, Freedom of movement Speech: Logical/coherent, Slurred Level of Consciousness: Quiet/awake, Other (Comment)(intoxicated) Mood: Depressed Affect: Depressed Anxiety Level: None Thought Processes: Coherent, Relevant Judgement: Impaired Orientation: Person, Place, Time, Situation Obsessive Compulsive Thoughts/Behaviors: None  Cognitive Functioning Concentration: Decreased Memory: Recent Intact, Remote Intact Is patient  IDD: No Insight: Poor Impulse Control: Fair Appetite: Fair Have you had any weight changes? : No Change Sleep: No Change Total Hours of Sleep: 6 Vegetative Symptoms: None  ADLScreening [facility] Assessment Services) Patient's cognitive ability adequate to safely complete daily activities?: Yes Patient able to express need for assistance with ADLs?: Yes Independently performs ADLs?: Yes (appropriate for developmental age)  Prior Inpatient Therapy Prior Inpatient Therapy: Yes Prior Therapy Dates: Multiple admits Prior Therapy Facilty/Provider(s): Pt cannot remember Reason for Treatment: Depression, SA  Prior Outpatient Therapy Prior Outpatient Therapy: Yes Prior Therapy Dates: 2019 Prior Therapy Facilty/Provider(s): [facility] Reason for Treatment: Depression Does patient have an ACCT team?: No Does patient have Intensive In-House Services?  : No Does patient have [facility] services? : No Does patient have P4CC services?: No  ADL Screening (condition at time of admission) Patient's cognitive ability adequate to safely complete daily activities?: Yes Is the patient deaf or have difficulty hearing?: No Does the patient have difficulty seeing, even when wearing glasses/contacts?: No Does the patient have difficulty concentrating, remembering, or making decisions?: No Patient able to express need for assistance with ADLs?: Yes Does the patient have difficulty dressing or bathing?: No Independently performs ADLs?: Yes (appropriate for developmental age) Does the patient have difficulty walking or climbing stairs?: No Weakness of Legs: None Weakness of Arms/Hands: None  Home Assistive Devices/Equipment Home Assistive Devices/Equipment: None    Abuse/Neglect Assessment (Assessment to be complete while patient is alone) Abuse/Neglect Assessment Can Be Completed: Yes Physical Abuse: Denies Verbal Abuse: Denies Sexual Abuse: Denies Exploitation of patient/patient's resources:  Denies Self-Neglect: Denies     Regulatory affairs officer (For Healthcare) Does Patient Have a Medical Advance Directive?: No Would patient like information on creating a medical advance directive?:  No - Patient declined          Disposition: Gave clinical report to Lindon Romp, FNP who recommended Pt be observed and evaluated by psychiatry in the morning. Notified Dr. April Palumbo and Nicholes Rough, RN of recommendation.  Disposition Initial Assessment Completed for this Encounter: Yes  This service was provided via telemedicine using a 2-way, interactive audio and video technology.  Names of all persons participating in this telemedicine service and their role in this encounter. Name: [name] Role: Patient  Name: Storm Frisk, [facility] Role: TTS counselor         Orpah Greek Anson Fret, [facility], [facility], [place] Triage Specialist [phone]  [name] 04/29/2019 2:52 AM

## 2019-04-29 NOTE — ED Notes (Signed)
Pt is able to walk with independently, but has a somewhat unsteady gait. Pt is not more articulate and able to speak clearly.

## 2019-06-08 ENCOUNTER — Ambulatory Visit: Payer: Medicaid Other | Admitting: Family Medicine

## 2019-06-14 ENCOUNTER — Encounter (HOSPITAL_COMMUNITY): Payer: Self-pay

## 2019-06-14 DIAGNOSIS — F1410 Cocaine abuse, uncomplicated: Secondary | ICD-10-CM | POA: Insufficient documentation

## 2019-06-14 DIAGNOSIS — Z79899 Other long term (current) drug therapy: Secondary | ICD-10-CM | POA: Insufficient documentation

## 2019-06-14 DIAGNOSIS — Y908 Blood alcohol level of 240 mg/100 ml or more: Secondary | ICD-10-CM | POA: Insufficient documentation

## 2019-06-14 DIAGNOSIS — I10 Essential (primary) hypertension: Secondary | ICD-10-CM | POA: Insufficient documentation

## 2019-06-14 DIAGNOSIS — M545 Low back pain: Secondary | ICD-10-CM | POA: Insufficient documentation

## 2019-06-14 DIAGNOSIS — G8929 Other chronic pain: Secondary | ICD-10-CM | POA: Insufficient documentation

## 2019-06-14 DIAGNOSIS — R1084 Generalized abdominal pain: Secondary | ICD-10-CM | POA: Insufficient documentation

## 2019-06-14 DIAGNOSIS — F17210 Nicotine dependence, cigarettes, uncomplicated: Secondary | ICD-10-CM | POA: Insufficient documentation

## 2019-06-14 DIAGNOSIS — F10920 Alcohol use, unspecified with intoxication, uncomplicated: Secondary | ICD-10-CM | POA: Insufficient documentation

## 2019-06-14 NOTE — ED Triage Notes (Signed)
Pt BIB [facility] from the side of the road. Pt endorses drinking 4, 40oz beers today and has c/o lower back pain. Pt is ambulatory with EMS.

## 2019-06-15 ENCOUNTER — Emergency Department (HOSPITAL_COMMUNITY): Payer: Self-pay

## 2019-06-15 ENCOUNTER — Emergency Department (HOSPITAL_COMMUNITY)
Admission: EM | Admit: 2019-06-15 | Discharge: 2019-06-15 | Disposition: A | Discharge status: Home / Self Care | Payer: Self-pay | Attending: Emergency Medicine | Admitting: Emergency Medicine

## 2019-06-15 DIAGNOSIS — F10920 Alcohol use, unspecified with intoxication, uncomplicated: Secondary | ICD-10-CM

## 2019-06-15 DIAGNOSIS — M545 Low back pain, unspecified: Secondary | ICD-10-CM

## 2019-06-15 DIAGNOSIS — G8929 Other chronic pain: Secondary | ICD-10-CM

## 2019-06-15 DIAGNOSIS — R109 Unspecified abdominal pain: Secondary | ICD-10-CM

## 2019-06-15 LAB — CBC WITH DIFFERENTIAL/PLATELET
Abs Immature Granulocytes: 0.01 10*3/uL (ref 0.00–0.07)
Basophils Absolute: 0.1 10*3/uL (ref 0.0–0.1)
Basophils Relative: 2 %
Eosinophils Absolute: 0.1 10*3/uL (ref 0.0–0.5)
Eosinophils Relative: 3 %
HCT: 25.1 % — ABNORMAL LOW (ref 39.0–52.0)
Hemoglobin: 7.2 g/dL — ABNORMAL LOW (ref 13.0–17.0)
Immature Granulocytes: 0 %
Lymphocytes Relative: 44 %
Lymphs Abs: 1.6 10*3/uL (ref 0.7–4.0)
MCH: 20.3 pg — ABNORMAL LOW (ref 26.0–34.0)
MCHC: 28.7 g/dL — ABNORMAL LOW (ref 30.0–36.0)
MCV: 70.9 fL — ABNORMAL LOW (ref 80.0–100.0)
Monocytes Absolute: 0.6 10*3/uL (ref 0.1–1.0)
Monocytes Relative: 17 %
Neutro Abs: 1.2 10*3/uL — ABNORMAL LOW (ref 1.7–7.7)
Neutrophils Relative %: 34 %
Platelets: 115 10*3/uL — ABNORMAL LOW (ref 150–400)
RBC: 3.54 MIL/uL — ABNORMAL LOW (ref 4.22–5.81)
RDW: 20.4 % — ABNORMAL HIGH (ref 11.5–15.5)
WBC: 3.6 10*3/uL — ABNORMAL LOW (ref 4.0–10.5)
nRBC: 0 % (ref 0.0–0.2)

## 2019-06-15 LAB — COMPREHENSIVE METABOLIC PANEL
ALT: 23 U/L (ref 0–44)
AST: 74 U/L — ABNORMAL HIGH (ref 15–41)
Albumin: 3.7 g/dL (ref 3.5–5.0)
Alkaline Phosphatase: 126 U/L (ref 38–126)
Anion gap: 11 (ref 5–15)
BUN: 6 mg/dL (ref 6–20)
CO2: 23 mmol/L (ref 22–32)
Calcium: 8.4 mg/dL — ABNORMAL LOW (ref 8.9–10.3)
Chloride: 100 mmol/L (ref 98–111)
Creatinine, Ser: 0.61 mg/dL (ref 0.61–1.24)
GFR calc Af Amer: >60 mL/min (ref >60)
GFR calc non Af Amer: >60 mL/min (ref >60)
Glucose, Bld: 99 mg/dL (ref 70–99)
Potassium: 3.6 mmol/L (ref 3.5–5.1)
Sodium: 134 mmol/L — ABNORMAL LOW (ref 135–145)
Total Bilirubin: 0.7 mg/dL (ref 0.3–1.2)
Total Protein: 8.2 g/dL — ABNORMAL HIGH (ref 6.5–8.1)

## 2019-06-15 LAB — LIPASE, BLOOD: Lipase: 56 U/L — ABNORMAL HIGH (ref 11–51)

## 2019-06-15 LAB — ETHANOL: Alcohol, Ethyl (B): 256 mg/dL — ABNORMAL HIGH (ref <10)

## 2019-06-15 NOTE — ED Notes (Signed)
No signature pad attached, pt verbalized understanding

## 2019-06-15 NOTE — Discharge Instructions (Addendum)
1. Medications: usual home medications 2. Treatment: rest, drink plenty of fluids, gentle stretching as discussed, alternate ice and heat 3. Follow Up: Please followup with your primary doctor or the [facility] in 3 days for discussion of your diagnoses and further evaluation after today's visit;  Return to the ER for worsening back pain, difficulty walking, loss of bowel or bladder control or other concerning symptoms

## 2019-06-15 NOTE — ED Notes (Signed)
Pt in lobby eating food.

## 2019-06-15 NOTE — ED Notes (Signed)
Pt given shasta twist. Pt tolerating drinking. Will continue to monitor.

## 2019-06-15 NOTE — ED Provider Notes (Signed)
[facility] DEPT Provider Note   CSN: [id number] Arrival date & time: 06/14/19  2247     History Chief Complaint  Patient presents with  . Back Pain  . Alcohol Intoxication    Barry Moore is a 54 y.o. male with a hx of alcoholic hepatitis, depression, pancreatitis, seizures, upper GI bleed presents to the Emergency Department complaining of gradual, persistent, progressively worsening nausea and vomiting with chronic abdominal pain.  Patient reports this has been ongoing for some time.  He reports emesis is mostly mucus-like and was without blood or bilious emesis.  He denies melena or hematochezia.  Patient reports generalized abdominal pain.  No fevers or chills.  Additionally, patient reports several days of low back pain.  Reports he believes he fell when he had a seizure several days ago.  Patient reports walking makes his pain worse and rest makes it better.  He denies loss of bowel or bladder control, leg weakness, numbness or other concerns.  He admits to drinking tonight.  The history is provided by the patient and medical records. No language interpreter was used.       Past Medical History:  Diagnosis Date  . Alcoholic hepatitis   . Depression   . ETOH abuse   . Gastric bleed 08/2018  . Hypertension   . Pancreatitis   . Seizures ([place])   . Suicidal behavior     Patient Active Problem List   Diagnosis Date Noted  . Duodenitis   . Acute upper GI bleed 01/01/2019  . Hypokalemia 01/01/2019  . Alcohol dependence ([place]) 01/01/2019  . Alcoholic liver disease ([place]) 01/01/2019  . Acute pancreatitis 12/13/2018  . Free intraperitoneal air 12/13/2018  . Homicidal ideation   . Gastrointestinal hemorrhage with melena 08/31/2018  . Colon ulcer   . Polyp of ascending colon   . Acute GI bleeding 08/05/2018  . Transaminitis 08/05/2018  . Abdominal pain 08/05/2018  . Nausea and vomiting 08/05/2018  . Substance induced mood disorder ([place])  07/16/2017  . Severe recurrent major depression without psychotic features ([place]) 05/15/2017  . Anemia 03/18/2017  . Acute blood loss anemia 02/21/2017  . UGI bleed 02/08/2017  . Alcohol withdrawal ([place]) 02/08/2017  . Cocaine abuse ([place]) 11/18/2016  . GI bleed 11/01/2016  . Alcohol abuse   . Major depressive disorder, recurrent severe without psychotic features ([place]) 07/06/2016  . 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 Seizure ([place]) 05/26/2016  . Tobacco abuse 05/26/2016  . Homeless 05/26/2016  . Essential hypertension 05/26/2016  . Alcohol dependence with alcohol-induced mood disorder ([place]) 02/14/2016  . Severe alcohol withdrawal without perceptual disturbances without complication ([place]) [id number]  . Alcoholic hepatitis without ascites 02/14/2016  . Thrombocytopenia ([place]) 02/14/2016    Past Surgical History:  Procedure Laterality Date  . BIOPSY  08/05/2018   Procedure: BIOPSY;  Surgeon: Rush Landmark Telford Nab., MD;  Location: [facility];  Service: Gastroenterology;;  . BIOPSY  08/07/2018   Procedure: BIOPSY;  Surgeon: Thornton Park, MD;  Location: [facility] ENDOSCOPY;  Service: Gastroenterology;;  . BIOPSY  01/02/2019   Procedure: BIOPSY;  Surgeon: Gatha Mayer, MD;  Location: [facility] ENDOSCOPY;  Service: Endoscopy;;  . COLONOSCOPY WITH PROPOFOL N/A 08/07/2018   Procedure: COLONOSCOPY WITH PROPOFOL;  Surgeon: Thornton Park, MD;  Location: [facility];  Service: Gastroenterology;  Laterality: N/A;  . ESOPHAGOGASTRODUODENOSCOPY N/A 01/02/2019   Procedure: ESOPHAGOGASTRODUODENOSCOPY (EGD);  Surgeon: Gatha Mayer, MD;  Location: [facility];  Service: Endoscopy;  Laterality: N/A;  . ESOPHAGOGASTRODUODENOSCOPY (EGD)  WITH PROPOFOL N/A 11/02/2016   Procedure: ESOPHAGOGASTRODUODENOSCOPY (EGD) WITH PROPOFOL;  Surgeon: Carol Ada, MD;  Location: [facility] ENDOSCOPY;  Service: Endoscopy;  Laterality: N/A;  . ESOPHAGOGASTRODUODENOSCOPY (EGD) WITH PROPOFOL N/A 08/05/2018   Procedure:  ESOPHAGOGASTRODUODENOSCOPY (EGD) WITH PROPOFOL;  Surgeon: Rush Landmark Telford Nab., MD;  Location: [facility];  Service: Gastroenterology;  Laterality: N/A;  . HERNIA REPAIR    . LEG SURGERY    . POLYPECTOMY  08/07/2018   Procedure: POLYPECTOMY;  Surgeon: Thornton Park, MD;  Location: [facility] ENDOSCOPY;  Service: Gastroenterology;;       Family History  Problem Relation Age of Onset  . Diabetes Mother   . Alcoholism Mother   . Emphysema Father   . Lung cancer Father   . Alcoholism Father     Social History   Tobacco Use  . Smoking status: Current Every Day Smoker    Packs/day: 1.00    Types: Cigarettes  . Smokeless tobacco: Never Used  Substance Use Topics  . Alcohol use: Yes    Comment: 5+ BEERS DAILY  . Drug use: Not Currently    Frequency: 3.0 times per week    Types: Cocaine    Home Medications Prior to Admission medications   Medication Sig Start Date End Date Taking? Authorizing Provider  folic acid (FOLVITE) 1 MG tablet Take 1 tablet (1 mg total) by mouth daily. 01/04/19   Desiree Hane, MD  Multiple Vitamin (MULTIVITAMIN WITH MINERALS) TABS tablet Take 1 tablet by mouth daily. 01/04/19   Desiree Hane, MD  pantoprazole (PROTONIX) 40 MG tablet Take 1 tablet (40 mg total) by mouth 2 (two) times daily. To reduce stomach acid 02/01/19 04/28/19  Fulp, Cammie, MD  thiamine 100 MG tablet Take 1 tablet (100 mg total) by mouth daily. 01/04/19   Oretha Milch D, MD  venlafaxine XR (EFFEXOR XR) 75 MG 24 hr capsule One every morning 02/01/19   Fulp, Cammie, MD  ferrous sulfate 325 (65 FE) MG tablet Take 1 tablet (325 mg total) by mouth daily with breakfast. Patient not taking: Reported on 09/23/2018 08/09/18 10/09/18  Elodia Florence., MD    Allergies    Tomato and Aspirin  Review of Systems   Review of Systems  Constitutional: Negative for appetite change, diaphoresis, fatigue, fever and unexpected weight change.  HENT: Negative for mouth sores.   Eyes: Negative  for visual disturbance.  Respiratory: Negative for cough, chest tightness, shortness of breath and wheezing.   Cardiovascular: Negative for chest pain.  Gastrointestinal: Positive for abdominal pain, nausea and vomiting. Negative for constipation and diarrhea.  Endocrine: Negative for polydipsia, polyphagia and polyuria.  Genitourinary: Negative for dysuria, frequency, hematuria and urgency.  Musculoskeletal: Positive for back pain. Negative for neck stiffness.  Skin: Negative for rash.  Allergic/Immunologic: Negative for immunocompromised state.  Neurological: Negative for syncope, light-headedness and headaches.  Hematological: Does not bruise/bleed easily.  Psychiatric/Behavioral: Negative for sleep disturbance. The patient is not nervous/anxious.     Physical Exam Updated Vital Signs BP 138/85   Pulse 85   Temp 98 F (36.7 C) (Oral)   Resp 20   SpO2 100%   Physical Exam Vitals and nursing note reviewed.  Constitutional:      General: He is not in acute distress.    Appearance: He is not diaphoretic.  HENT:     Head: Normocephalic.  Eyes:     General: No scleral icterus.    Conjunctiva/sclera: Conjunctivae normal.  Cardiovascular:     Rate and  Rhythm: Normal rate and regular rhythm.     Pulses: Normal pulses.          Radial pulses are 2+ on the right side and 2+ on the left side.  Pulmonary:     Effort: No tachypnea, accessory muscle usage, prolonged expiration, respiratory distress or retractions.     Breath sounds: No stridor.     Comments: Equal chest rise. No increased work of breathing. Abdominal:     General: There is no distension.     Palpations: Abdomen is soft.     Tenderness: There is generalized abdominal tenderness. There is no right CVA tenderness, left CVA tenderness, guarding or rebound.  Musculoskeletal:     Cervical back: Normal range of motion.     Lumbar back: Tenderness and bony tenderness present. Normal range of motion.     Comments: Moves  all extremities equally and without difficulty.  Skin:    General: Skin is warm and dry.     Capillary Refill: Capillary refill takes less than 2 seconds.  Neurological:     Mental Status: He is alert.     GCS: GCS eye subscore is 4. GCS verbal subscore is 5. GCS motor subscore is 6.     Comments: Speech is clear and goal oriented. Strength 5/5 in the bilateral lower extremities. Sensation intact to the bilateral lower extremities. Normal gait.  Psychiatric:        Mood and Affect: Mood normal.     ED Results / Procedures / Treatments   Labs (all labs ordered are listed, but only abnormal results are displayed) Labs Reviewed  CBC WITH DIFFERENTIAL/PLATELET - Abnormal; Notable for the following components:      Result Value   WBC 3.6 (*)    RBC 3.54 (*)    Hemoglobin 7.2 (*)    HCT 25.1 (*)    MCV 70.9 (*)    MCH 20.3 (*)    MCHC 28.7 (*)    RDW 20.4 (*)    Platelets 115 (*)    Neutro Abs 1.2 (*)    All other components within normal limits  COMPREHENSIVE METABOLIC PANEL - Abnormal; Notable for the following components:   Sodium 134 (*)    Calcium 8.4 (*)    Total Protein 8.2 (*)    AST 74 (*)    All other components within normal limits  LIPASE, BLOOD - Abnormal; Notable for the following components:   Lipase 56 (*)    All other components within normal limits  ETHANOL - Abnormal; Notable for the following components:   Alcohol, Ethyl (B) 256 (*)    All other components within normal limits    Radiology DG Lumbar Spine Complete  Result Date: 06/15/2019 CLINICAL DATA:  Fall, pain EXAM: LUMBAR SPINE - COMPLETE 4+ VIEW COMPARISON:  08/29/2018 FINDINGS: Disc space narrowing at L4-5 and L5-S1. Early anterior spurring. Normal alignment. No fracture. SI joints symmetric and unremarkable. IMPRESSION: No acute bony abnormality. 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 Electronically Signed   By: Rolm Baptise M.D.   On: 06/15/2019 03:40     Procedures Procedures (including critical care time)  Medications Ordered in ED Medications - No data to display  ED Course  I have reviewed the triage vital signs and the nursing notes.  Pertinent labs & imaging results that were available during my care of the patient were reviewed by me and considered in my medical decision making (see chart for details).  MDM Rules/Calculators/A&P                       Patient presents for nausea and vomiting in the setting of chronic abdominal pain, chronic alcoholism and chronic pancreatitis.  Abdomen is soft and minimally tender.  No focal tenderness.  No rebound or guarding.  Vital signs are within normal limits.  Patient additionally with low back pain after fall.  Will obtain plain films to ensure no fracture.  Patient walks without difficulty, normal neurologic exam of the lower extremities.  No loss of bowel or bladder control.  5:07 AM Patient remains ambulatory here in the emergency department.  He is tolerated p.o. without difficulty.  No additional vomiting.  On repeat exam abdomen remains soft without rebound or guarding.  Labs are reassuring.  Lipase slightly elevated but appears to be patient's baseline.  X-ray without evidence of compression fracture.  Discussed home therapies and reasons for further evaluation.  Patient states understanding and is in agreement with the plan.   Final Clinical Impression(s) / ED Diagnoses Final diagnoses:  Chronic abdominal pain  Acute bilateral low back pain without sciatica  Alcoholic intoxication without complication [facility])    Rx / DC Orders ED Discharge Orders    None       Brandan Robicheaux, Gwenlyn Perking 06/15/19 [place], Delice Bison, DO 06/15/19 [phone]

## 2019-06-21 ENCOUNTER — Emergency Department (HOSPITAL_COMMUNITY): Payer: Medicaid Other

## 2019-06-21 ENCOUNTER — Other Ambulatory Visit: Payer: Self-pay

## 2019-06-21 ENCOUNTER — Emergency Department (HOSPITAL_COMMUNITY)
Admission: EM | Admit: 2019-06-21 | Discharge: 2019-06-22 | Disposition: A | Discharge status: Home / Self Care | Payer: Medicaid Other | Attending: Emergency Medicine | Admitting: Emergency Medicine

## 2019-06-21 ENCOUNTER — Encounter (HOSPITAL_COMMUNITY): Payer: Self-pay | Admitting: Emergency Medicine

## 2019-06-21 DIAGNOSIS — I10 Essential (primary) hypertension: Secondary | ICD-10-CM | POA: Insufficient documentation

## 2019-06-21 DIAGNOSIS — R569 Unspecified convulsions: Secondary | ICD-10-CM | POA: Insufficient documentation

## 2019-06-21 DIAGNOSIS — F17210 Nicotine dependence, cigarettes, uncomplicated: Secondary | ICD-10-CM | POA: Insufficient documentation

## 2019-06-21 DIAGNOSIS — F10920 Alcohol use, unspecified with intoxication, uncomplicated: Secondary | ICD-10-CM

## 2019-06-21 DIAGNOSIS — Z79899 Other long term (current) drug therapy: Secondary | ICD-10-CM | POA: Insufficient documentation

## 2019-06-21 DIAGNOSIS — F10229 Alcohol dependence with intoxication, unspecified: Secondary | ICD-10-CM | POA: Insufficient documentation

## 2019-06-21 DIAGNOSIS — Y908 Blood alcohol level of 240 mg/100 ml or more: Secondary | ICD-10-CM | POA: Insufficient documentation

## 2019-06-21 LAB — CBC WITH DIFFERENTIAL/PLATELET
Abs Immature Granulocytes: 0.02 10*3/uL (ref 0.00–0.07)
Basophils Absolute: 0.1 10*3/uL (ref 0.0–0.1)
Basophils Relative: 1 %
Eosinophils Absolute: 0.1 10*3/uL (ref 0.0–0.5)
Eosinophils Relative: 2 %
HCT: 25.4 % — ABNORMAL LOW (ref 39.0–52.0)
Hemoglobin: 7 g/dL — ABNORMAL LOW (ref 13.0–17.0)
Immature Granulocytes: 0 %
Lymphocytes Relative: 25 %
Lymphs Abs: 1.4 10*3/uL (ref 0.7–4.0)
MCH: 19.8 pg — ABNORMAL LOW (ref 26.0–34.0)
MCHC: 27.6 g/dL — ABNORMAL LOW (ref 30.0–36.0)
MCV: 72 fL — ABNORMAL LOW (ref 80.0–100.0)
Monocytes Absolute: 0.8 10*3/uL (ref 0.1–1.0)
Monocytes Relative: 14 %
Neutro Abs: 3.3 10*3/uL (ref 1.7–7.7)
Neutrophils Relative %: 58 %
Platelets: 95 10*3/uL — ABNORMAL LOW (ref 150–400)
RBC: 3.53 MIL/uL — ABNORMAL LOW (ref 4.22–5.81)
RDW: 20.9 % — ABNORMAL HIGH (ref 11.5–15.5)
WBC: 5.7 10*3/uL (ref 4.0–10.5)
nRBC: 0 % (ref 0.0–0.2)

## 2019-06-21 LAB — COMPREHENSIVE METABOLIC PANEL
ALT: 23 U/L (ref 0–44)
AST: 67 U/L — ABNORMAL HIGH (ref 15–41)
Albumin: 3.5 g/dL (ref 3.5–5.0)
Alkaline Phosphatase: 136 U/L — ABNORMAL HIGH (ref 38–126)
Anion gap: 12 (ref 5–15)
BUN: <5 mg/dL — ABNORMAL LOW (ref 6–20)
CO2: 22 mmol/L (ref 22–32)
Calcium: 8.7 mg/dL — ABNORMAL LOW (ref 8.9–10.3)
Chloride: 104 mmol/L (ref 98–111)
Creatinine, Ser: 0.61 mg/dL (ref 0.61–1.24)
GFR calc Af Amer: >60 mL/min (ref >60)
GFR calc non Af Amer: >60 mL/min (ref >60)
Glucose, Bld: 94 mg/dL (ref 70–99)
Potassium: 3.8 mmol/L (ref 3.5–5.1)
Sodium: 138 mmol/L (ref 135–145)
Total Bilirubin: 0.6 mg/dL (ref 0.3–1.2)
Total Protein: 8.1 g/dL (ref 6.5–8.1)

## 2019-06-21 LAB — ETHANOL: Alcohol, Ethyl (B): 393 mg/dL (ref <10)

## 2019-06-21 NOTE — ED Triage Notes (Signed)
Pt coming in after being found on the curb after a by stander called 911. Pt admitting to drinking at least 3 40's today. Having abd pain. C-collar placed. Pt stated he thinks he passed out and had a seizure, that's why he was on the ground. Alert

## 2019-06-21 NOTE — ED Provider Notes (Signed)
[facility] EMERGENCY DEPARTMENT Provider Note   CSN: [id number] Arrival date & time: 06/21/19  1619     History No chief complaint on file.   Scott Chessman is a 54 y.o. male.  Patient with history of alcohol abuse, pancreatitis, hepatitis, suicidality --presents to the emergency department after he was found unresponsive on a curb per EMS.  Patient states he may have had a seizure.  He missed to drinking a lot of alcohol today and cannot quantify.  He is minimally cooperative with history.  Level 5 caveat due to altered mental status due to suspected EtOH intoxication.        Past Medical History:  Diagnosis Date  . Alcoholic hepatitis   . Depression   . ETOH abuse   . Gastric bleed 08/2018  . Hypertension   . Pancreatitis   . Seizures ([place])   . Suicidal behavior     Patient Active Problem List   Diagnosis Date Noted  . Duodenitis   . Acute upper GI bleed 01/01/2019  . Hypokalemia 01/01/2019  . Alcohol dependence ([place]) 01/01/2019  . Alcoholic liver disease ([place]) 01/01/2019  . Acute pancreatitis 12/13/2018  . Free intraperitoneal air 12/13/2018  . Homicidal ideation   . Gastrointestinal hemorrhage with melena 08/31/2018  . Colon ulcer   . Polyp of ascending colon   . Acute GI bleeding 08/05/2018  . Transaminitis 08/05/2018  . Abdominal pain 08/05/2018  . Nausea and vomiting 08/05/2018  . Substance induced mood disorder ([place]) 07/16/2017  . Severe recurrent major depression without psychotic features ([place]) 05/15/2017  . Anemia 03/18/2017  . Acute blood loss anemia 02/21/2017  . UGI bleed 02/08/2017  . Alcohol withdrawal ([place]) 02/08/2017  . Cocaine abuse ([place]) 11/18/2016  . GI bleed 11/01/2016  . Alcohol abuse   . Major depressive disorder, recurrent severe without psychotic features ([place]) 07/06/2016  . Alcohol-induced mood disorder ([place]) 06/15/2016  . Seizure ([place]) 05/26/2016  . Tobacco abuse 05/26/2016  . Homeless 05/26/2016  .  Essential hypertension 05/26/2016  . Alcohol dependence with alcohol-induced mood disorder ([place]) 02/14/2016  . Severe alcohol withdrawal without perceptual disturbances without complication ([place]) [id number]  . Alcoholic hepatitis without ascites 02/14/2016  . Thrombocytopenia ([place]) 02/14/2016    Past Surgical History:  Procedure Laterality Date  . BIOPSY  08/05/2018   Procedure: BIOPSY;  Surgeon: Rush Landmark Telford Nab., MD;  Location: [place];  Service: Gastroenterology;;  . BIOPSY  08/07/2018   Procedure: BIOPSY;  Surgeon: Thornton Park, MD;  Location[facility] ENDOSCOPY;  Service: Gastroenterology;;  . BIOPSY  01/02/2019   Procedure: BIOPSY;  Surgeon: Gatha Mayer, MD;  Location: [facility] ENDOSCOPY;  Service: Endoscopy;;  . COLONOSCOPY WITH PROPOFOL N/A 08/07/2018   Procedure: COLONOSCOPY WITH PROPOFOL;  Surgeon: Thornton Park, MD;  Location: [place];  Service: Gastroenterology;  Laterality: N/A;  . ESOPHAGOGASTRODUODENOSCOPY N/A 01/02/2019   Procedure: ESOPHAGOGASTRODUODENOSCOPY (EGD);  Surgeon: Gatha Mayer, MD;  Location: [facility]OSCOPY;  Service: Endoscopy;  Laterality: N/A;  . ESOPHAGOGASTRODUODENOSCOPY (EGD) WITH PROPOFOL N/A 11/02/2016   Procedure: ESOPHAGOGASTRODUODENOSCOPY (EGD) WITH PROPOFOL;  Surgeon: Carol Ada, MD;  Location: [facility] ENDOSCOPY;  Service: Endoscopy;  Laterality: N/A;  . ESOPHAGOGASTRODUODENOSCOPY (EGD) WITH PROPOFOL N/A 08/05/2018   Procedure: ESOPHAGOGASTRODUODENOSCOPY (EGD) WITH PROPOFOL;  Surgeon: Rush Landmark Telford Nab., MD;  Location: [place];  Service: Gastroenterology;  Laterality: N/A;  . HERNIA REPAIR    . LEG SURGERY    . POLYPECTOMY  08/07/2018   Procedure: POLYPECTOMY;  Surgeon: Thornton Park, MD;  Location:  [facility] ENDOSCOPY;  Service: Gastroenterology;;       Family History  Problem Relation Age of Onset  . Diabetes Mother   . Alcoholism Mother   . Emphysema Father   . Lung cancer Father   . Alcoholism Father     Social  History   Tobacco Use  . Smoking status: Current Every Day Smoker    Packs/day: 1.00    Types: Cigarettes  . Smokeless tobacco: Never Used  Substance Use Topics  . Alcohol use: Yes    Comment: 5+ BEERS DAILY  . Drug use: Not Currently    Frequency: 3.0 times per week    Types: Cocaine    Home Medications Prior to Admission medications   Medication Sig Start Date End Date Taking? Authorizing Provider  folic acid (FOLVITE) 1 MG tablet Take 1 tablet (1 mg total) by mouth daily. 01/04/19   Desiree Hane, MD  Multiple Vitamin (MULTIVITAMIN WITH MINERALS) TABS tablet Take 1 tablet by mouth daily. 01/04/19   Desiree Hane, MD  pantoprazole (PROTONIX) 40 MG tablet Take 1 tablet (40 mg total) by mouth 2 (two) times daily. To reduce stomach acid 02/01/19 04/28/19  Fulp, Cammie, MD  thiamine 100 MG tablet Take 1 tablet (100 mg total) by mouth daily. 01/04/19   Desiree Hane, MD  venlafaxine XR (EFFEXOR XR) 75 MG 24 hr capsule One every morning 02/01/19   Fulp, Cammie, MD  ferrous sulfate 325 (65 FE) MG tablet Take 1 tablet (325 mg total) by mouth daily with breakfast. Patient not taking: Reported on 09/23/2018 08/09/18 10/09/18  Elodia Florence., MD    Allergies    Tomato, Aspirin, and Sulfa antibiotics  Review of Systems   Review of Systems  Unable to perform ROS: Mental status change    Physical Exam Updated Vital Signs BP 133/78 (BP Location: Right Arm)   Pulse 92   Temp 98.7 F (37.1 C) (Oral)   Resp (!) 22   Ht 5\' 8"  (1.727 m)   Wt 70 kg   SpO2 97%   BMI 23.46 kg/m   Physical Exam Vitals and nursing note reviewed.  Constitutional:      Appearance: He is well-developed.  HENT:     Head: Normocephalic and atraumatic.     Right Ear: External ear normal.     Left Ear: External ear normal.     Nose: Nose normal.     Mouth/Throat:     Comments: No intraoral trauma. No lateral tongue biting. Eyes:     General:        Right eye: No discharge.        Left eye:  No discharge.     Conjunctiva/sclera: Conjunctivae normal.  Cardiovascular:     Rate and Rhythm: Normal rate and regular rhythm.     Heart sounds: Normal heart sounds.  Pulmonary:     Effort: Pulmonary effort is normal.     Breath sounds: Normal breath sounds.  Abdominal:     Palpations: Abdomen is soft.     Tenderness: There is no abdominal tenderness.  Musculoskeletal:     Cervical back: Normal range of motion and neck supple.  Skin:    General: Skin is warm and dry.  Neurological:     General: No focal deficit present.     Mental Status: He is alert.     Comments: Moves all extremities purposefully when asked.  He opens his neck and sticks out his tongue.  Otherwise,  not cooperative with neuro exam.     ED Results / Procedures / Treatments   Labs (all labs ordered are listed, but only abnormal results are displayed) Labs Reviewed  CBC WITH DIFFERENTIAL/PLATELET - Abnormal; Notable for the following components:      Result Value   RBC 3.53 (*)    Hemoglobin 7.0 (*)    HCT 25.4 (*)    MCV 72.0 (*)    MCH 19.8 (*)    MCHC 27.6 (*)    RDW 20.9 (*)    Platelets 95 (*)    All other components within normal limits  COMPREHENSIVE METABOLIC PANEL - Abnormal; Notable for the following components:   BUN <5 (*)    Calcium 8.7 (*)    AST 67 (*)    Alkaline Phosphatase 136 (*)    All other components within normal limits  ETHANOL - Abnormal; Notable for the following components:   Alcohol, Ethyl (B) 393 (*)    All other components within normal limits  CBG MONITORING, ED    EKG None  Radiology CT Head Wo Contrast  Result Date: 06/21/2019 CLINICAL DATA:  Headache, possible seizure, intoxication EXAM: CT HEAD WITHOUT CONTRAST TECHNIQUE: Contiguous axial images were obtained from the base of the skull through the vertex without intravenous contrast. COMPARISON:  12/13/2018 FINDINGS: Brain: No acute infarct or hemorrhage. Lateral ventricles and midline structures are  unremarkable. No acute extra-axial fluid collections. No mass effect. Vascular: No hyperdense vessel or unexpected calcification. Skull: Normal. Negative for fracture or focal lesion. Sinuses/Orbits: No acute finding. Other: None IMPRESSION: 1. No acute intracranial process. Electronically Signed   By: Randa Ngo M.D.   On: 06/21/2019 18:07   CT Cervical Spine Wo Contrast  Result Date: 06/21/2019 CLINICAL DATA:  Seizure, fell, intoxicated EXAM: CT CERVICAL SPINE WITHOUT CONTRAST TECHNIQUE: Multidetector CT imaging of the cervical spine was performed without intravenous contrast. Multiplanar CT image reconstructions were also generated. COMPARISON:  12/13/2018 FINDINGS: Alignment: Alignment is grossly anatomic. Skull base and vertebrae: No acute displaced fractures. Soft tissues and spinal canal: No prevertebral fluid or swelling. No visible canal hematoma. Disc levels: Minimal spondylosis at C4/C5 with anterior osteophyte formation. No significant bony encroachment upon the central canal or neural foramina. Upper chest: There is a nonunion of a prior left clavicular fracture. Airway is patent. Lung apices are clear. Other: Reconstructed images demonstrate no additional findings. IMPRESSION: 1. No acute cervical spine fracture. Electronically Signed   By: Randa Ngo M.D.   On: 06/21/2019 18:12    Procedures Procedures (including critical care time)  Medications Ordered in ED Medications - No data to display  ED Course  I have reviewed the triage vital signs and the nursing notes.  Pertinent labs & imaging results that were available during my care of the patient were reviewed by me and considered in my medical decision making (see chart for details).  Patient seen and examined. Work-up initiated. In c-collar --cannot clear due to alcohol intoxication.  Will need imaging of the head and neck.  Vital signs reviewed and are as follows: BP 133/78 (BP Location: Right Arm)   Pulse 92   Temp  98.7 F (37.1 C) (Oral)   Resp (!) 22   Ht 5\' 8"  (1.727 m)   Wt 70 kg   SpO2 97%   BMI 23.46 kg/m   7:14 PM imaging results reviewed.  I removed patient's c-collar.  He continues to be somnolent but arousable, exam unchanged from previous.  We  will continue to monitor.  Alcohol was 393.  12:11 AM patient main stable, sleeping in bed.  He refused to ambulate earlier.  He does not appear to be clinically sober at this point.  Signout to [employer] at shift change she will monitor.  Plan for discharged home when clinically sober and safe to do so.     MDM Rules/Calculators/A&P                        Final Clinical Impression(s) / ED Diagnoses Final diagnoses:  Alcoholic intoxication without complication [facility])    Rx / DC Orders ED Discharge Orders    None       Carlisle Cater, Hershal Coria 06/22/19 0012    Davonna Belling, MD 06/22/19 2240

## 2019-06-21 NOTE — ED Notes (Signed)
Pt assisted with urinal by tech in hallway. Bed secured with seizure pads. Pt able to speak but has slurred speech. Is alert upon calling name but is disoriented

## 2019-06-21 NOTE — ED Notes (Signed)
Provider informed of elevated ETOH. Will continue to monitor

## 2019-06-21 NOTE — ED Notes (Signed)
Pt refused to ambulate in halls.

## 2019-06-22 NOTE — ED Notes (Signed)
Reviewed discharge paperwork with patient including community resources for alcohol/substance abuse. Pt verbalized understanding.

## 2019-06-22 NOTE — ED Notes (Signed)
Ambulated pt around blue Doc box cubicle, steady gate no assistance needed. Only c/o lower back pain. Pt requesting water

## 2019-06-22 NOTE — ED Provider Notes (Signed)
Patient signed out to me.  Intoxicated.  Reassessment after he sobers up.  5:28 AM Patient eating and drinking.  No complaints now.  Appears stable for discharge.   Montine Circle, PA-C [phone]    Delora Fuel, MD [phone]

## 2019-06-22 NOTE — ED Notes (Signed)
Pt given food and drink. Currently eating sandwich, tolerating well.

## 2019-06-25 ENCOUNTER — Other Ambulatory Visit: Payer: Self-pay

## 2019-06-25 ENCOUNTER — Emergency Department (HOSPITAL_COMMUNITY)
Admission: EM | Admit: 2019-06-25 | Discharge: 2019-06-26 | Disposition: A | Discharge status: Home / Self Care | Payer: Medicaid Other | Attending: Emergency Medicine | Admitting: Emergency Medicine

## 2019-06-25 DIAGNOSIS — F17210 Nicotine dependence, cigarettes, uncomplicated: Secondary | ICD-10-CM | POA: Insufficient documentation

## 2019-06-25 DIAGNOSIS — F10920 Alcohol use, unspecified with intoxication, uncomplicated: Secondary | ICD-10-CM

## 2019-06-25 DIAGNOSIS — I10 Essential (primary) hypertension: Secondary | ICD-10-CM | POA: Insufficient documentation

## 2019-06-25 DIAGNOSIS — Z590 Homelessness: Secondary | ICD-10-CM | POA: Insufficient documentation

## 2019-06-25 DIAGNOSIS — Z79899 Other long term (current) drug therapy: Secondary | ICD-10-CM | POA: Insufficient documentation

## 2019-06-25 DIAGNOSIS — F10220 Alcohol dependence with intoxication, uncomplicated: Secondary | ICD-10-CM | POA: Insufficient documentation

## 2019-06-25 NOTE — ED Provider Notes (Signed)
[facility] DEPT Provider Note  CSN: [id number] Arrival date & time: 06/25/19 2133  Chief Complaint(s) Seizures  HPI Barry Moore is a 54 y.o. male here for:  HPI Dry heaves for one month Still keeps oral intake down. Drank tonight. Usually drinks 4-5 40oz beers per day.  Reports that he had a seizure today.  States that he has had several seizures in the past.  No focal deficits.  In triage patient was complaining of headache.  Not currently complaining of a headache.  No other physical complaints.  Past Medical History Past Medical History:  Diagnosis Date  . Alcoholic hepatitis   . Depression   . ETOH abuse   . Gastric bleed 08/2018  . Hypertension   . Pancreatitis   . Seizures ([place])   . Suicidal behavior    Patient Active Problem List   Diagnosis Date Noted  . Duodenitis   . Acute upper GI bleed 01/01/2019  . Hypokalemia 01/01/2019  . Alcohol dependence ([place]) 01/01/2019  . Alcoholic liver disease ([place]) 01/01/2019  . Acute pancreatitis 12/13/2018  . Free intraperitoneal air 12/13/2018  . Homicidal ideation   . Gastrointestinal hemorrhage with melena 08/31/2018  . Colon ulcer   . Polyp of ascending colon   . Acute GI bleeding 08/05/2018  . Transaminitis 08/05/2018  . Abdominal pain 08/05/2018  . Nausea and vomiting 08/05/2018  . Substance induced mood disorder ([place]) 07/16/2017  . Severe recurrent major depression without psychotic features ([place]) 05/15/2017  . Anemia 03/18/2017  . Acute blood loss anemia 02/21/2017  . UGI bleed 02/08/2017  . Alcohol withdrawal ([address]) 02/08/2017  . Cocaine abuse ([place]) 11/18/2016  . GI bleed 11/01/2016  . Alcohol abuse   . 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 Seizure ([place]) 05/26/2016  . Tobacco abuse 05/26/2016  . Homeless 05/26/2016  . Essential hypertension 05/26/2016  . Alcohol  dependence with alcohol-induced mood disorder ([place]) 02/14/2016  . Severe alcohol withdrawal without perceptual disturbances without complication ([place]) [id number]  . Alcoholic hepatitis without ascites 02/14/2016  . Thrombocytopenia ([place]) 02/14/2016   Home Medication(s) Prior to Admission medications   Medication Sig Start Date End Date Taking? Authorizing Provider  folic acid (FOLVITE) 1 MG tablet Take 1 tablet (1 mg total) by mouth daily. 01/04/19   Desiree Hane, MD  Multiple Vitamin (MULTIVITAMIN WITH MINERALS) TABS tablet Take 1 tablet by mouth daily. 01/04/19   Desiree Hane, MD  pantoprazole (PROTONIX) 40 MG tablet Take 1 tablet (40 mg total) by mouth 2 (two) times daily. To reduce stomach acid 02/01/19 04/28/19  Fulp, Cammie, MD  thiamine 100 MG tablet Take 1 tablet (100 mg total) by mouth daily. 01/04/19   Oretha Milch D, MD  venlafaxine XR (EFFEXOR XR) 75 MG 24 hr capsule One every morning 02/01/19   Fulp, Cammie, MD  ferrous sulfate 325 (65 FE) MG tablet Take 1 tablet (325 mg total) by mouth daily with breakfast. Patient not taking: Reported on 09/23/2018 08/09/18 10/09/18  Elodia Florence., MD  Past Surgical History Past Surgical History:  Procedure Laterality Date  . BIOPSY  08/05/2018   Procedure: BIOPSY;  Surgeon: Rush Landmark Telford Nab., MD;  Location: [facility];  Service: Gastroenterology;;  . BIOPSY  08/07/2018   Procedure: BIOPSY;  Surgeon: Thornton Park, MD;  [facility];  Service: Gastroenterology;;  . BIOPSY  01/02/2019   Procedure: BIOPSY;  Surgeon: Gatha Mayer, MD;  Location: [facility] ENDOSCOPY;  Service: Endoscopy;;  . COLONOSCOPY WITH PROPOFOL N/A 08/07/2018   Procedure: COLONOSCOPY WITH PROPOFOL;  Surgeon: Thornton Park, MD;  Location: [facility];  Service: Gastroenterology;  Laterality: N/A;  .  ESOPHAGOGASTRODUODENOSCOPY N/A 01/02/2019   Procedure: ESOPHAGOGASTRODUODENOSCOPY (EGD);  Surgeon: Gatha Mayer, MD;  Location: [facility];  Service: Endoscopy;  Laterality: N/A;  . ESOPHAGOGASTRODUODENOSCOPY (EGD) WITH PROPOFOL N/A 11/02/2016   Procedure: ESOPHAGOGASTRODUODENOSCOPY (EGD) WITH PROPOFOL;  Surgeon: Carol Ada, MD;  Location: [facility] ENDOSCOPY;  Service: Endoscopy;  Laterality: N/A;  . ESOPHAGOGASTRODUODENOSCOPY (EGD) WITH PROPOFOL N/A 08/05/2018   Procedure: ESOPHAGOGASTRODUODENOSCOPY (EGD) WITH PROPOFOL;  Surgeon: Rush Landmark Telford Nab., MD;  Location: [facility];  Service: Gastroenterology;  Laterality: N/A;  . HERNIA REPAIR    . LEG SURGERY    . POLYPECTOMY  08/07/2018   Procedure: POLYPECTOMY;  Surgeon: Thornton Park, MD;  Location[facility]COPY;  Service: Gastroenterology;;   Family History Family History  Problem Relation Age of Onset  . Diabetes Mother   . Alcoholism Mother   . Emphysema Father   . Lung cancer Father   . Alcoholism Father     Social History Social History   Tobacco Use  . Smoking status: Current Every Day Smoker    Packs/day: 1.00    Types: Cigarettes  . Smokeless tobacco: Never Used  Substance Use Topics  . Alcohol use: Yes    Comment: 5+ BEERS DAILY  . Drug use: Not Currently    Frequency: 3.0 times per week    Types: Cocaine   Allergies Tomato, Aspirin, and Sulfa antibiotics  Review of Systems Review of Systems All other systems are reviewed and are negative for acute change except as noted in the HPI  Physical Exam Vital Signs  I have reviewed the triage vital signs BP 109/65   Pulse 87   Temp 98.1 F (36.7 C) (Oral)   Resp 16   Ht 5\' 4"  (1.626 m)   Wt 81.6 kg   SpO2 95%   BMI 30.90 kg/m   Physical Exam Vitals reviewed.  Constitutional:      General: He is not in acute distress.    Appearance: He is well-developed. He is not diaphoretic.     Comments: intoxicated  HENT:     Head: Normocephalic and  atraumatic. No contusion or laceration.     Nose: Nose normal.  Eyes:     General: No scleral icterus.       Right eye: No discharge.        Left eye: No discharge.     Conjunctiva/sclera: Conjunctivae normal.     Pupils: Pupils are equal, round, and reactive to light.  Cardiovascular:     Rate and Rhythm: Normal rate and regular rhythm.     Heart sounds: No murmur. No friction rub. No gallop.   Pulmonary:     Effort: Pulmonary effort is normal. No respiratory distress.     Breath sounds: Normal breath sounds. No stridor. No rales.  Abdominal:     General: There is no distension.     Palpations: Abdomen is soft.  Tenderness: There is no abdominal tenderness.  Musculoskeletal:        General: No tenderness.     Cervical back: Normal range of motion and neck supple.  Skin:    General: Skin is warm and dry.     Findings: No erythema or rash.  Neurological:     Mental Status: He is alert and oriented to person, place, and time.     ED Results and Treatments Labs (all labs ordered are listed, but only abnormal results are displayed) Labs Reviewed - No data to display                                                                                                                       EKG  EKG Interpretation  Date/Time:    Ventricular Rate:    PR Interval:    QRS Duration:   QT Interval:    QTC Calculation:   R Axis:     Text Interpretation:        Radiology No results found.  Pertinent labs & imaging results that were available during my care of the patient were reviewed by me and considered in my medical decision making (see chart for details).  Medications Ordered in ED Medications - No data to display                                                                                                                                  Procedures Procedures  (including critical care time)  Medical Decision Making / ED Course I have reviewed the nursing notes for  this encounter and the patient's prior records (if available in EHR or on provided paperwork).   Barry Moore was evaluated in Emergency Department on 06/26/2019 for the symptoms described in the history of present illness. He was evaluated in the context of the global COVID-19 pandemic, which necessitated consideration that the patient might be at risk for infection with the SARS-CoV-2 virus that causes COVID-19. Institutional protocols and algorithms that pertain to the evaluation of patients at risk for COVID-19 are in a state of rapid change based on information released by regulatory bodies including the CDC and federal and state organizations. These policies and algorithms were followed during the patient's care in the ED.  Clinically intoxicated.  No evidence of trauma on exam requiring imaging.  Patient allowed to metabolize to freedom.  Provided with food which she was able  to tolerate.  After several hours of metabolize to freedom, patient was able to ambulate without complication.      Final Clinical Impression(s) / ED Diagnoses Final diagnoses:  Alcoholic intoxication without complication ([place])   The patient appears reasonably screened and/or stabilized for discharge and I doubt any other medical condition or other [facility] requiring further screening, evaluation, or treatment in the ED at this time prior to discharge. Safe for discharge with strict return precautions.  Disposition: Discharge  Condition: Good  I have discussed the results, Dx and Tx plan with the patient/family who expressed understanding and agree(s) with the plan. Discharge instructions discussed at length. The patient/family was given strict return precautions who verbalized understanding of the instructions. No further questions at time of discharge.    ED Discharge Orders    None        Follow Up: Primary care provider  Schedule an appointment as soon as possible for a visit  As needed      This  chart was dictated using voice recognition software.  Despite best efforts to proofread,  errors can occur which can change the documentation meaning.   Fatima Blank, MD 06/26/19 [phone]

## 2019-06-25 NOTE — ED Triage Notes (Signed)
Pt was found behind a gas station with ETOH. Pt sts he had seizure 2 hours ago before drinking unknown number of beers.pt c/o of a headache

## 2019-06-26 ENCOUNTER — Other Ambulatory Visit: Payer: Self-pay

## 2019-07-01 ENCOUNTER — Other Ambulatory Visit: Payer: Self-pay

## 2019-07-01 ENCOUNTER — Encounter (HOSPITAL_COMMUNITY): Payer: Self-pay

## 2019-07-01 ENCOUNTER — Inpatient Hospital Stay (HOSPITAL_COMMUNITY)
Admission: EM | Admit: 2019-07-01 | Discharge: 2019-07-04 | DRG: 378 | Disposition: A | Discharge status: Home / Self Care | Payer: Self-pay | Attending: Internal Medicine | Admitting: Internal Medicine

## 2019-07-01 DIAGNOSIS — D62 Acute posthemorrhagic anemia: Secondary | ICD-10-CM | POA: Diagnosis present

## 2019-07-01 DIAGNOSIS — Z590 Homelessness: Secondary | ICD-10-CM

## 2019-07-01 DIAGNOSIS — K7010 Alcoholic hepatitis without ascites: Secondary | ICD-10-CM | POA: Diagnosis present

## 2019-07-01 DIAGNOSIS — F17210 Nicotine dependence, cigarettes, uncomplicated: Secondary | ICD-10-CM | POA: Diagnosis present

## 2019-07-01 DIAGNOSIS — D6959 Other secondary thrombocytopenia: Secondary | ICD-10-CM | POA: Diagnosis present

## 2019-07-01 DIAGNOSIS — F1020 Alcohol dependence, uncomplicated: Secondary | ICD-10-CM | POA: Diagnosis present

## 2019-07-01 DIAGNOSIS — Z811 Family history of alcohol abuse and dependence: Secondary | ICD-10-CM

## 2019-07-01 DIAGNOSIS — K920 Hematemesis: Secondary | ICD-10-CM | POA: Diagnosis present

## 2019-07-01 DIAGNOSIS — K921 Melena: Principal | ICD-10-CM | POA: Diagnosis present

## 2019-07-01 DIAGNOSIS — D509 Iron deficiency anemia, unspecified: Secondary | ICD-10-CM | POA: Diagnosis present

## 2019-07-01 DIAGNOSIS — K3189 Other diseases of stomach and duodenum: Secondary | ICD-10-CM | POA: Diagnosis present

## 2019-07-01 DIAGNOSIS — Z20822 Contact with and (suspected) exposure to covid-19: Secondary | ICD-10-CM | POA: Diagnosis present

## 2019-07-01 DIAGNOSIS — D696 Thrombocytopenia, unspecified: Secondary | ICD-10-CM | POA: Diagnosis present

## 2019-07-01 DIAGNOSIS — K7030 Alcoholic cirrhosis of liver without ascites: Secondary | ICD-10-CM | POA: Diagnosis present

## 2019-07-01 DIAGNOSIS — Z79899 Other long term (current) drug therapy: Secondary | ICD-10-CM

## 2019-07-01 DIAGNOSIS — F10229 Alcohol dependence with intoxication, unspecified: Secondary | ICD-10-CM | POA: Diagnosis present

## 2019-07-01 DIAGNOSIS — R1310 Dysphagia, unspecified: Secondary | ICD-10-CM | POA: Diagnosis present

## 2019-07-01 DIAGNOSIS — K922 Gastrointestinal hemorrhage, unspecified: Secondary | ICD-10-CM | POA: Diagnosis present

## 2019-07-01 DIAGNOSIS — I10 Essential (primary) hypertension: Secondary | ICD-10-CM | POA: Diagnosis present

## 2019-07-01 LAB — I-STAT CHEM 8, ED
BUN: <3 mg/dL — ABNORMAL LOW (ref 6–20)
Calcium, Ion: 1.02 mmol/L — ABNORMAL LOW (ref 1.15–1.40)
Chloride: 101 mmol/L (ref 98–111)
Creatinine, Ser: 1 mg/dL (ref 0.61–1.24)
Glucose, Bld: 93 mg/dL (ref 70–99)
HCT: 26 % — ABNORMAL LOW (ref 39.0–52.0)
Hemoglobin: 8.8 g/dL — ABNORMAL LOW (ref 13.0–17.0)
Potassium: 3.8 mmol/L (ref 3.5–5.1)
Sodium: 137 mmol/L (ref 135–145)
TCO2: 27 mmol/L (ref 22–32)

## 2019-07-01 MED ORDER — THIAMINE HCL 100 MG PO TABS
100.0000 mg | ORAL_TABLET | Freq: Every day | ORAL | Status: DC
  Administered 2019-07-02: 100 mg via ORAL
  Filled 2019-07-01: qty 1

## 2019-07-01 MED ORDER — SODIUM CHLORIDE 0.9 % IV SOLN
2.0000 g | Freq: Once | INTRAVENOUS | Status: AC
  Administered 2019-07-02: 2 g via INTRAVENOUS
  Filled 2019-07-01: qty 20

## 2019-07-01 MED ORDER — OCTREOTIDE LOAD VIA INFUSION
50.0000 ug | Freq: Once | INTRAVENOUS | Status: AC
  Administered 2019-07-02: 50 ug via INTRAVENOUS
  Filled 2019-07-01: qty 25

## 2019-07-01 MED ORDER — LORAZEPAM 1 MG PO TABS: 0.0000 mg | ORAL_TABLET | Freq: Two times a day (BID) | ORAL | Status: DC

## 2019-07-01 MED ORDER — THIAMINE HCL 100 MG/ML IJ SOLN
100.0000 mg | Freq: Every day | INTRAMUSCULAR | Status: DC
  Administered 2019-07-03: 100 mg via INTRAVENOUS
  Filled 2019-07-01: qty 2

## 2019-07-01 MED ORDER — SODIUM CHLORIDE 0.9 % IV SOLN
8.0000 mg/h | INTRAVENOUS | Status: DC
  Administered 2019-07-02: 8 mg/h via INTRAVENOUS
  Filled 2019-07-01 (×2): qty 80

## 2019-07-01 MED ORDER — SODIUM CHLORIDE 0.9 % IV SOLN
80.0000 mg | Freq: Once | INTRAVENOUS | Status: AC
  Administered 2019-07-01: 80 mg via INTRAVENOUS
  Filled 2019-07-01: qty 80

## 2019-07-01 MED ORDER — LORAZEPAM 2 MG/ML IJ SOLN: 0.0000 mg | Freq: Four times a day (QID) | INTRAMUSCULAR | Status: DC

## 2019-07-01 MED ORDER — LORAZEPAM 2 MG/ML IJ SOLN: 0.0000 mg | Freq: Two times a day (BID) | INTRAMUSCULAR | Status: DC

## 2019-07-01 MED ORDER — SODIUM CHLORIDE 0.9 % IV SOLN
50.0000 ug/h | INTRAVENOUS | Status: DC
  Administered 2019-07-02: 50 ug/h via INTRAVENOUS
  Filled 2019-07-01 (×2): qty 1

## 2019-07-01 MED ORDER — LORAZEPAM 1 MG PO TABS
0.0000 mg | ORAL_TABLET | Freq: Four times a day (QID) | ORAL | Status: DC
  Administered 2019-07-02: 1 mg via ORAL
  Filled 2019-07-01 (×2): qty 1

## 2019-07-01 MED ORDER — PANTOPRAZOLE SODIUM 40 MG IV SOLR: 40.0000 mg | Freq: Two times a day (BID) | INTRAVENOUS | Status: DC

## 2019-07-01 NOTE — ED Provider Notes (Signed)
[facility] DEPT Provider Note   CSN: [id number] Arrival date & time: 07/01/19  2157     History Chief Complaint  Patient presents with  . GI Bleeding    Barry Moore is a 54 y.o. male.  Patient brought to the emergency department by EMS for GI bleed.  Patient reports that he has been experiencing vomiting of blood and rectal bleeding.  He is complaining of diffuse abdominal pain.  He reports that he fell up against a table 2 days ago and hit his abdomen.  Patient admits to alcohol intake today, is somnolent and intoxicated.        Past Medical History:  Diagnosis Date  . Alcoholic hepatitis   . Depression   . ETOH abuse   . Gastric bleed 08/2018  . Hypertension   . Pancreatitis   . Seizures ([place])   . Suicidal behavior     Patient Active Problem List   Diagnosis Date Noted  . Acute upper GI bleeding 07/02/2019  . Duodenitis   . Acute upper GI bleed 01/01/2019  . Hypokalemia 01/01/2019  . Alcohol dependence ([place]) 01/01/2019  . Alcoholic liver disease ([place]) 01/01/2019  . Acute pancreatitis 12/13/2018  . Free intraperitoneal air 12/13/2018  . Homicidal ideation   . Gastrointestinal hemorrhage with melena 08/31/2018  . Colon ulcer   . Polyp of ascending colon   . Acute GI bleeding 08/05/2018  . Transaminitis 08/05/2018  . Abdominal pain 08/05/2018  . Nausea and vomiting 08/05/2018  . Substance induced mood disorder ([place]) 07/16/2017  . Severe recurrent major depression without psychotic features ([place]) 05/15/2017  . Anemia 03/18/2017  . Acute blood loss anemia 02/21/2017  . UGI bleed 02/08/2017  . Alcohol withdrawal ([place]) 02/08/2017  . Cocaine abuse ([place]) 11/18/2016  . GI bleed 11/01/2016  . Alcohol abuse   . 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 Seizure ([place]) 05/26/2016  . Tobacco abuse 05/26/2016  . Homeless 05/26/2016  .  Essential hypertension 05/26/2016  . Alcohol dependence with alcohol-induced mood disorder ([place]) 02/14/2016  . Severe alcohol withdrawal without perceptual disturbances without complication ([place]) [id number]  . Alcoholic hepatitis without ascites 02/14/2016  . Thrombocytopenia ([place]) 02/14/2016    Past Surgical History:  Procedure Laterality Date  . BIOPSY  08/05/2018   Procedure: BIOPSY;  Surgeon: Rush Landmark Telford Nab., MD;  Location: [facility];  Service: Gastroenterology;;  . BIOPSY  08/07/2018   Procedure: BIOPSY;  Surgeon: Thornton Park, MD;  Location: [facility] ENDOSCOPY;  Service: Gastroenterology;;  . BIOPSY  01/02/2019   Procedure: BIOPSY;  Surgeon: Gatha Mayer, MD;  Location: [facility] ENDOSCOPY;  Service: Endoscopy;;  . COLONOSCOPY WITH PROPOFOL N/A 08/07/2018   Procedure: COLONOSCOPY WITH PROPOFOL;  Surgeon: Thornton Park, MD;  Location: [facility];  Service: Gastroenterology;  Laterality: N/A;  . ESOPHAGOGASTRODUODENOSCOPY N/A 01/02/2019   Procedure: ESOPHAGOGASTRODUODENOSCOPY (EGD);  Surgeon: Gatha Mayer, MD;  Location: [facility]OSCOPY;  Service: Endoscopy;  Laterality: N/A;  . ESOPHAGOGASTRODUODENOSCOPY (EGD) WITH PROPOFOL N/A 11/02/2016   Procedure: ESOPHAGOGASTRODUODENOSCOPY (EGD) WITH PROPOFOL;  Surgeon: Carol Ada, MD;  Location: [facility] ENDOSCOPY;  Service: Endoscopy;  Laterality: N/A;  . ESOPHAGOGASTRODUODENOSCOPY (EGD) WITH PROPOFOL N/A 08/05/2018   Procedure: ESOPHAGOGASTRODUODENOSCOPY (EGD) WITH PROPOFOL;  Surgeon: Rush Landmark Telford Nab., MD;  Location: [facility];  Service: Gastroenterology;  Laterality: N/A;  . HERNIA REPAIR    . LEG SURGERY    . POLYPECTOMY  08/07/2018   Procedure: POLYPECTOMY;  Surgeon: Thornton Park, MD;  Location: [facility]COPY;  Service: Gastroenterology;;       Family History  Problem Relation Age of Onset  . Diabetes Mother   . Alcoholism Mother   . Emphysema Father   . Lung cancer Father   . Alcoholism Father     Social  History   Tobacco Use  . Smoking status: Current Every Day Smoker    Packs/day: 1.00    Types: Cigarettes  . Smokeless tobacco: Never Used  Substance Use Topics  . Alcohol use: Yes    Comment: 5+ BEERS DAILY  . Drug use: Not Currently    Frequency: 3.0 times per week    Types: Cocaine    Home Medications Prior to Admission medications   Medication Sig Start Date End Date Taking? Authorizing Provider  folic acid (FOLVITE) 1 MG tablet Take 1 tablet (1 mg total) by mouth daily. Patient not taking: Reported on 07/02/2019 01/04/19   Desiree Hane, MD  Multiple Vitamin (MULTIVITAMIN WITH MINERALS) TABS tablet Take 1 tablet by mouth daily. Patient not taking: Reported on 07/02/2019 01/04/19   Oretha Milch D, MD  thiamine 100 MG tablet Take 1 tablet (100 mg total) by mouth daily. Patient not taking: Reported on 07/02/2019 01/04/19   Oretha Milch D, MD  venlafaxine XR (EFFEXOR XR) 75 MG 24 hr capsule One every morning Patient not taking: Reported on 07/02/2019 02/01/19   Antony Blackbird, MD  ferrous sulfate 325 (65 FE) MG tablet Take 1 tablet (325 mg total) by mouth daily with breakfast. Patient not taking: Reported on 09/23/2018 08/09/18 10/09/18  Elodia Florence., MD    Allergies    Tomato, Aspirin, and Sulfa antibiotics  Review of Systems   Review of Systems  Unable to perform ROS: Mental status change    Physical Exam Updated Vital Signs BP 113/63   Pulse 92   Temp 98 F (36.7 C) (Oral)   Resp 15   Ht 5\' 4"  (1.626 m)   Wt 81.6 kg   SpO2 98%   BMI 30.90 kg/m   Physical Exam Vitals and nursing note reviewed.  Constitutional:      General: He is not in acute distress.    Appearance: He is well-developed.     Comments: Disheveled, malodorous   HENT:     Head: Normocephalic and atraumatic.     Right Ear: Hearing normal.     Left Ear: Hearing normal.     Nose: Nose normal.  Eyes:     Conjunctiva/sclera: Conjunctivae normal.     Pupils: Pupils are equal, round,  and reactive to light.  Cardiovascular:     Rate and Rhythm: Regular rhythm.     Heart sounds: S1 normal and S2 normal. No murmur. No friction rub. No gallop.   Pulmonary:     Effort: Pulmonary effort is normal. No respiratory distress.     Breath sounds: Normal breath sounds.  Chest:     Chest wall: No tenderness.  Abdominal:     General: Bowel sounds are normal.     Palpations: Abdomen is soft.     Tenderness: There is generalized abdominal tenderness. There is no guarding or rebound. Negative signs include Murphy's sign and McBurney's sign.     Hernia: No hernia is present.  Musculoskeletal:        General: Normal range of motion.     Cervical back: Normal range of motion and neck supple.  Skin:    General: Skin  is warm and dry.     Findings: No rash.     Comments: Three distinct circular bruises across mid-abdomen. Central bruise has a 0.75cm linear scab.  Neurological:     GCS: GCS eye subscore is 4. GCS verbal subscore is 5. GCS motor subscore is 6.     Cranial Nerves: No cranial nerve deficit.     Sensory: No sensory deficit.     Coordination: Coordination normal.     Comments: Somnolent, intoxicated  Psychiatric:        Speech: Speech normal.        Behavior: Behavior normal.        Thought Content: Thought content normal.     ED Results / Procedures / Treatments   Labs (all labs ordered are listed, but only abnormal results are displayed) Labs Reviewed  CBC WITH DIFFERENTIAL/PLATELET - Abnormal; Notable for the following components:      Result Value   RBC 3.35 (*)    Hemoglobin 6.8 (*)    HCT 24.1 (*)    MCV 71.9 (*)    MCH 20.3 (*)    MCHC 28.2 (*)    RDW 21.0 (*)    Platelets 75 (*)    All other components within normal limits  BASIC METABOLIC PANEL - Abnormal; Notable for the following components:   Glucose, Bld 100 (*)    BUN <5 (*)    Creatinine, Ser 0.60 (*)    Calcium 8.6 (*)    All other components within normal limits  HEPATIC FUNCTION PANEL -  Abnormal; Notable for the following components:   Total Protein 8.2 (*)    AST 118 (*)    Alkaline Phosphatase 141 (*)    Bilirubin, Direct 0.3 (*)    All other components within normal limits  ETHANOL - Abnormal; Notable for the following components:   Alcohol, Ethyl (B) 295 (*)    All other components within normal limits  I-STAT CHEM 8, ED - Abnormal; Notable for the following components:   BUN <3 (*)    Calcium, Ion 1.02 (*)    Hemoglobin 8.8 (*)    HCT 26.0 (*)    All other components within normal limits  POC OCCULT BLOOD, ED - Abnormal; Notable for the following components:   Fecal Occult Bld POSITIVE (*)    All other components within normal limits  RESPIRATORY PANEL BY RT PCR (FLU A&B, COVID)  PROTIME-INR  LIPASE, BLOOD  AMMONIA  TYPE AND SCREEN  PREPARE RBC (CROSSMATCH)    EKG EKG Interpretation  Date/Time:  Sunday July 01 2019 23:16:17 EDT Ventricular Rate:  80 PR Interval:    QRS Duration: 95 QT Interval:  385 QTC Calculation: 445 R Axis:   82 Text Interpretation: Sinus rhythm ST elev, probable normal early repol pattern No significant change since last tracing Confirmed by Orpah Greek [phone]) on 07/01/2019 11:35:45 PM   Radiology CT HEAD WO CONTRAST  Result Date: 07/02/2019 CLINICAL DATA:  Altered mental status EXAM: CT HEAD WITHOUT CONTRAST TECHNIQUE: Contiguous axial images were obtained from the base of the skull through the vertex without intravenous contrast. COMPARISON:  06/21/2019 head CT FINDINGS: Brain: No acute territorial infarction, hemorrhage, or intracranial mass. Moderate atrophy somewhat advanced for age. Mild hypodensity in the white matter consistent with chronic small vessel ischemic change. Stable ventricle size Vascular: No hyperdense vessels.  Carotid vascular calcification Skull: Normal. Negative for fracture or focal lesion. Sinuses/Orbits: Mucosal thickening in the sinuses Other: None IMPRESSION: 1.  No CT evidence for acute  intracranial abnormality. 2. Advanced for age atrophy. Mild chronic small vessel ischemic change of the white matter Electronically Signed   By: Donavan Foil M.D.   On: 07/02/2019 01:32   CT CHEST W CONTRAST  Result Date: 07/02/2019 CLINICAL DATA:  Status post trauma. EXAM: CT CHEST, ABDOMEN, AND PELVIS WITH CONTRAST TECHNIQUE: Multidetector CT imaging of the chest, abdomen and pelvis was performed following the standard protocol during bolus administration of intravenous contrast. CONTRAST:  142mL OMNIPAQUE IOHEXOL 300 MG/ML  SOLN COMPARISON:  April 28, 2019 FINDINGS: CT CHEST FINDINGS Cardiovascular: No significant vascular findings. Normal heart size. No pericardial effusion. Moderate to marked severity coronary artery calcification is seen. Mediastinum/Nodes: No enlarged mediastinal, hilar, or axillary lymph nodes. Thyroid gland, trachea, and esophagus demonstrate no significant findings. Lungs/Pleura: Small patchy areas of low attenuation are seen within the anterolateral aspect of the right upper lobe. The largest area measures approximately 1.7 cm x 1.5 cm, while the adjacent areas measure less than 1 cm. There is no evidence of a pleural effusion or pneumothorax. Musculoskeletal: A chronic fracture deformity is seen involving the mid left clavicle. CT ABDOMEN PELVIS FINDINGS Hepatobiliary: No focal liver abnormality is seen. No gallstones, gallbladder wall thickening, or biliary dilatation. Pancreas: Unremarkable. No pancreatic ductal dilatation or surrounding inflammatory changes. Spleen: Normal in size without focal abnormality. Adrenals/Urinary Tract: Adrenal glands are unremarkable. Kidneys are normal, without renal calculi, focal lesion, or hydronephrosis. Bladder is unremarkable. Stomach/Bowel: Stomach is within normal limits. Appendix appears normal. No evidence of bowel wall thickening, distention, or inflammatory changes. Vascular/Lymphatic: Moderate severity aortic calcification. No enlarged  abdominal or pelvic lymph nodes. Reproductive: Prostate is unremarkable. Other: Very mild nonspecific mesenteric inflammatory fat stranding is seen within the mid right abdomen. This is a adjacent to the anteromedial aspect of the right kidney and posterior to the hepatic flexure. This is seen on the prior study and is decreased in severity on the current exam. Musculoskeletal: No acute or significant osseous findings. IMPRESSION: 1. Small patchy areas of low attenuation within the anterolateral aspect of the right upper lobe, as described above. While these may represent areas of focal atelectasis, sequelae associated with pulmonary contusion cannot be excluded. 2. Very mild, chronic nonspecific mesenteric inflammatory fat stranding within the mid right abdomen. This is decreased in severity when compared to the prior study. 3. Moderate to marked severity coronary artery calcification. 4. Moderate severity aortic calcification. 5. Chronic fracture deformity involving the mid left clavicle. Aortic Atherosclerosis (ICD10-I70.0). Electronically Signed   By: Virgina Norfolk M.D.   On: 07/02/2019 01:42   CT ABDOMEN PELVIS W CONTRAST  Result Date: 07/02/2019 CLINICAL DATA:  Status post trauma. EXAM: CT CHEST, ABDOMEN, AND PELVIS WITH CONTRAST TECHNIQUE: Multidetector CT imaging of the chest, abdomen and pelvis was performed following the standard protocol during bolus administration of intravenous contrast. CONTRAST:  1106mL OMNIPAQUE IOHEXOL 300 MG/ML  SOLN COMPARISON:  April 28, 2019 FINDINGS: CT CHEST FINDINGS Cardiovascular: No significant vascular findings. Normal heart size. No pericardial effusion. Moderate to marked severity coronary artery calcification is seen. Mediastinum/Nodes: No enlarged mediastinal, hilar, or axillary lymph nodes. Thyroid gland, trachea, and esophagus demonstrate no significant findings. Lungs/Pleura: Small patchy areas of low attenuation are seen within the anterolateral aspect of  the right upper lobe. The largest area measures approximately 1.7 cm x 1.5 cm, while the adjacent areas measure less than 1 cm. There is no evidence of a pleural effusion or pneumothorax. Musculoskeletal: A chronic fracture  deformity is seen involving the mid left clavicle. CT ABDOMEN PELVIS FINDINGS Hepatobiliary: No focal liver abnormality is seen. No gallstones, gallbladder wall thickening, or biliary dilatation. Pancreas: Unremarkable. No pancreatic ductal dilatation or surrounding inflammatory changes. Spleen: Normal in size without focal abnormality. Adrenals/Urinary Tract: Adrenal glands are unremarkable. Kidneys are normal, without renal calculi, focal lesion, or hydronephrosis. Bladder is unremarkable. Stomach/Bowel: Stomach is within normal limits. Appendix appears normal. No evidence of bowel wall thickening, distention, or inflammatory changes. Vascular/Lymphatic: Moderate severity aortic calcification. No enlarged abdominal or pelvic lymph nodes. Reproductive: Prostate is unremarkable. Other: Very mild nonspecific mesenteric inflammatory fat stranding is seen within the mid right abdomen. This is a adjacent to the anteromedial aspect of the right kidney and posterior to the hepatic flexure. This is seen on the prior study and is decreased in severity on the current exam. Musculoskeletal: No acute or significant osseous findings. IMPRESSION: 1. Small patchy areas of low attenuation within the anterolateral aspect of the right upper lobe, as described above. While these may represent areas of focal atelectasis, sequelae associated with pulmonary contusion cannot be excluded. 2. Very mild, chronic nonspecific mesenteric inflammatory fat stranding within the mid right abdomen. This is decreased in severity when compared to the prior study. 3. Moderate to marked severity coronary artery calcification. 4. Moderate severity aortic calcification. 5. Chronic fracture deformity involving the mid left clavicle.  Electronically Signed   By: Virgina Norfolk M.D.   On: 07/02/2019 01:43    Procedures Procedures (including critical care time)  Medications Ordered in ED Medications  octreotide (SANDOSTATIN) 2 mcg/mL load via infusion 50 mcg (50 mcg Intravenous Bolus from Bag 07/02/19 0013)    And  octreotide (SANDOSTATIN) 500 mcg in sodium chloride 0.9 % 250 mL (2 mcg/mL) infusion (50 mcg/hr Intravenous New Bag/Given 07/02/19 0027)  pantoprazole (PROTONIX) 80 mg in sodium chloride 0.9 % 100 mL (0.8 mg/mL) infusion (8 mg/hr Intravenous New Bag/Given 07/02/19 0003)  pantoprazole (PROTONIX) injection 40 mg (has no administration in time range)  LORazepam (ATIVAN) injection 0-4 mg (0 mg Intravenous Not Given 07/02/19 0027)    Or  LORazepam (ATIVAN) tablet 0-4 mg ( Oral See Alternative 07/02/19 0027)  LORazepam (ATIVAN) injection 0-4 mg (has no administration in time range)    Or  LORazepam (ATIVAN) tablet 0-4 mg (has no administration in time range)  thiamine tablet 100 mg (has no administration in time range)    Or  thiamine (B-1) injection 100 mg (has no administration in time range)  sodium chloride (PF) 0.9 % injection (has no administration in time range)  0.9 %  sodium chloride infusion (has no administration in time range)  cefTRIAXone (ROCEPHIN) 2 g in sodium chloride 0.9 % 100 mL IVPB (0 g Intravenous Stopped 07/02/19 0032)  pantoprazole (PROTONIX) 80 mg in sodium chloride 0.9 % 100 mL IVPB (0 mg Intravenous Stopped 07/02/19 0005)  iohexol (OMNIPAQUE) 300 MG/ML solution 100 mL (100 mLs Intravenous Contrast Given 07/02/19 0037)    ED Course  I have reviewed the triage vital signs and the nursing notes.  Pertinent labs & imaging results that were available during my care of the patient were reviewed by me and considered in my medical decision making (see chart for details).    MDM Rules/Calculators/A&P                      Patient presents to the emergency department for evaluation of GI bleed.   Patient has a history of chronic  alcoholism with recurrent GI bleeds in the past.  He does have a history of esophageal varices, however they are grade 1 and appears that most of his GI bleeds in the past have been from esophagitis and alcoholic gastritis.  Patient's hemoglobin is found to be 6.8.  He was initiated on IV octreotide and Protonix.  He was given empiric Rocephin because of his history of cirrhosis but he does not have any previous episodes of documented ascites.  CT scan of his abdomen today does not show any ascites.  Patient type and cross for 2 units of blood and will initiate transfusion.  Discussed with Dr. Tarri Glenn, on-call for [facility] gastroenterology who he has seen multiple times in the past.  She will consult on the patient in the morning.  CRITICAL CARE Performed by: Orpah Greek   Total critical care time: 35 minutes  Critical care time was exclusive of separately billable procedures and treating other patients.  Critical care was necessary to treat or prevent imminent or life-threatening deterioration.  Critical care was time spent personally by me on the following activities: development of treatment plan with patient and/or surrogate as well as nursing, discussions with consultants, evaluation of patient's response to treatment, examination of patient, obtaining history from patient or surrogate, ordering and performing treatments and interventions, ordering and review of laboratory studies, ordering and review of radiographic studies, pulse oximetry and re-evaluation of patient's condition.  Final Clinical Impression(s) / ED Diagnoses Final diagnoses:  Upper GI bleed    Rx / DC Orders ED Discharge Orders    None       Negin Hegg, Gwenyth Allegra, MD 07/02/19 [phone]

## 2019-07-01 NOTE — ED Triage Notes (Signed)
Per EMS, patient c/o blood in stool and vomiting bright red blood. Patient has hx of esophageal varices and is hep c positive. Patient says he fell 2 days ago and hit the center of his abdomen. Patient currently has round bruise in this area. Patient endorses drinking "a lot" of alcohol today.

## 2019-07-02 ENCOUNTER — Emergency Department (HOSPITAL_COMMUNITY): Payer: Self-pay

## 2019-07-02 ENCOUNTER — Encounter (HOSPITAL_COMMUNITY): Payer: Self-pay

## 2019-07-02 DIAGNOSIS — K922 Gastrointestinal hemorrhage, unspecified: Secondary | ICD-10-CM | POA: Diagnosis present

## 2019-07-02 DIAGNOSIS — F10220 Alcohol dependence with intoxication, uncomplicated: Secondary | ICD-10-CM

## 2019-07-02 DIAGNOSIS — D509 Iron deficiency anemia, unspecified: Secondary | ICD-10-CM

## 2019-07-02 DIAGNOSIS — D696 Thrombocytopenia, unspecified: Secondary | ICD-10-CM

## 2019-07-02 LAB — CBC WITH DIFFERENTIAL/PLATELET
Abs Immature Granulocytes: 0.01 10*3/uL (ref 0.00–0.07)
Abs Immature Granulocytes: 0.01 10*3/uL (ref 0.00–0.07)
Basophils Absolute: 0.1 10*3/uL (ref 0.0–0.1)
Basophils Absolute: 0.1 10*3/uL (ref 0.0–0.1)
Basophils Relative: 1 %
Basophils Relative: 1 %
Eosinophils Absolute: 0.1 10*3/uL (ref 0.0–0.5)
Eosinophils Absolute: 0.1 10*3/uL (ref 0.0–0.5)
Eosinophils Relative: 2 %
Eosinophils Relative: 3 %
HCT: 24.1 % — ABNORMAL LOW (ref 39.0–52.0)
HCT: 29.2 % — ABNORMAL LOW (ref 39.0–52.0)
Hemoglobin: 6.8 g/dL — CL (ref 13.0–17.0)
Hemoglobin: 8.8 g/dL — ABNORMAL LOW (ref 13.0–17.0)
Immature Granulocytes: 0 %
Immature Granulocytes: 0 %
Lymphocytes Relative: 20 %
Lymphocytes Relative: 29 %
Lymphs Abs: 1.1 10*3/uL (ref 0.7–4.0)
Lymphs Abs: 1.3 10*3/uL (ref 0.7–4.0)
MCH: 20.3 pg — ABNORMAL LOW (ref 26.0–34.0)
MCH: 22.5 pg — ABNORMAL LOW (ref 26.0–34.0)
MCHC: 28.2 g/dL — ABNORMAL LOW (ref 30.0–36.0)
MCHC: 30.1 g/dL (ref 30.0–36.0)
MCV: 71.9 fL — ABNORMAL LOW (ref 80.0–100.0)
MCV: 74.7 fL — ABNORMAL LOW (ref 80.0–100.0)
Monocytes Absolute: 0.5 10*3/uL (ref 0.1–1.0)
Monocytes Absolute: 0.8 10*3/uL (ref 0.1–1.0)
Monocytes Relative: 12 %
Monocytes Relative: 15 %
Neutro Abs: 2.3 10*3/uL (ref 1.7–7.7)
Neutro Abs: 3.3 10*3/uL (ref 1.7–7.7)
Neutrophils Relative %: 55 %
Neutrophils Relative %: 62 %
Platelets: 69 10*3/uL — ABNORMAL LOW (ref 150–400)
Platelets: 75 10*3/uL — ABNORMAL LOW (ref 150–400)
RBC: 3.35 MIL/uL — ABNORMAL LOW (ref 4.22–5.81)
RBC: 3.91 MIL/uL — ABNORMAL LOW (ref 4.22–5.81)
RDW: 21 % — ABNORMAL HIGH (ref 11.5–15.5)
RDW: 22.5 % — ABNORMAL HIGH (ref 11.5–15.5)
WBC: 4.3 10*3/uL (ref 4.0–10.5)
WBC: 5.4 10*3/uL (ref 4.0–10.5)
nRBC: 0 % (ref 0.0–0.2)
nRBC: 0 % (ref 0.0–0.2)

## 2019-07-02 LAB — AMMONIA: Ammonia: 29 umol/L (ref 9–35)

## 2019-07-02 LAB — COMPREHENSIVE METABOLIC PANEL
ALT: 29 U/L (ref 0–44)
AST: 91 U/L — ABNORMAL HIGH (ref 15–41)
Albumin: 3.5 g/dL (ref 3.5–5.0)
Alkaline Phosphatase: 138 U/L — ABNORMAL HIGH (ref 38–126)
Anion gap: 10 (ref 5–15)
BUN: 6 mg/dL (ref 6–20)
CO2: 25 mmol/L (ref 22–32)
Calcium: 8.3 mg/dL — ABNORMAL LOW (ref 8.9–10.3)
Chloride: 99 mmol/L (ref 98–111)
Creatinine, Ser: 0.73 mg/dL (ref 0.61–1.24)
GFR calc Af Amer: >60 mL/min (ref >60)
GFR calc non Af Amer: >60 mL/min (ref >60)
Glucose, Bld: 93 mg/dL (ref 70–99)
Potassium: 4.2 mmol/L (ref 3.5–5.1)
Sodium: 134 mmol/L — ABNORMAL LOW (ref 135–145)
Total Bilirubin: 1.6 mg/dL — ABNORMAL HIGH (ref 0.3–1.2)
Total Protein: 8 g/dL (ref 6.5–8.1)

## 2019-07-02 LAB — HEPATIC FUNCTION PANEL
ALT: 32 U/L (ref 0–44)
AST: 118 U/L — ABNORMAL HIGH (ref 15–41)
Albumin: 3.7 g/dL (ref 3.5–5.0)
Alkaline Phosphatase: 141 U/L — ABNORMAL HIGH (ref 38–126)
Bilirubin, Direct: 0.3 mg/dL — ABNORMAL HIGH (ref 0.0–0.2)
Indirect Bilirubin: 0.4 mg/dL (ref 0.3–0.9)
Total Bilirubin: 0.7 mg/dL (ref 0.3–1.2)
Total Protein: 8.2 g/dL — ABNORMAL HIGH (ref 6.5–8.1)

## 2019-07-02 LAB — RESPIRATORY PANEL BY RT PCR (FLU A&B, COVID)
Influenza A by PCR: NEGATIVE
Influenza B by PCR: NEGATIVE
SARS Coronavirus 2 by RT PCR: NEGATIVE

## 2019-07-02 LAB — PREPARE RBC (CROSSMATCH)

## 2019-07-02 LAB — BASIC METABOLIC PANEL
Anion gap: 9 (ref 5–15)
BUN: <5 mg/dL — ABNORMAL LOW (ref 6–20)
CO2: 25 mmol/L (ref 22–32)
Calcium: 8.6 mg/dL — ABNORMAL LOW (ref 8.9–10.3)
Chloride: 102 mmol/L (ref 98–111)
Creatinine, Ser: 0.6 mg/dL — ABNORMAL LOW (ref 0.61–1.24)
GFR calc Af Amer: >60 mL/min (ref >60)
GFR calc non Af Amer: >60 mL/min (ref >60)
Glucose, Bld: 100 mg/dL — ABNORMAL HIGH (ref 70–99)
Potassium: 3.8 mmol/L (ref 3.5–5.1)
Sodium: 136 mmol/L (ref 135–145)

## 2019-07-02 LAB — PHOSPHORUS: Phosphorus: 3.4 mg/dL (ref 2.5–4.6)

## 2019-07-02 LAB — POC OCCULT BLOOD, ED: Fecal Occult Bld: POSITIVE — AB

## 2019-07-02 LAB — ETHANOL: Alcohol, Ethyl (B): 295 mg/dL — ABNORMAL HIGH (ref <10)

## 2019-07-02 LAB — PROTIME-INR
INR: 1.1 (ref 0.8–1.2)
Prothrombin Time: 14.3 seconds (ref 11.4–15.2)

## 2019-07-02 LAB — MAGNESIUM: Magnesium: 1.8 mg/dL (ref 1.7–2.4)

## 2019-07-02 LAB — LIPASE, BLOOD: Lipase: 50 U/L (ref 11–51)

## 2019-07-02 MED ORDER — LORAZEPAM 2 MG/ML IJ SOLN: 0.0000 mg | Freq: Three times a day (TID) | INTRAMUSCULAR | Status: DC

## 2019-07-02 MED ORDER — SODIUM CHLORIDE 0.9 % IV SOLN
10.0000 mL/h | Freq: Once | INTRAVENOUS | Status: AC
  Administered 2019-07-02: 10 mL/h via INTRAVENOUS

## 2019-07-02 MED ORDER — ACETAMINOPHEN 325 MG PO TABS: 650.0000 mg | ORAL_TABLET | Freq: Four times a day (QID) | ORAL | Status: DC | PRN

## 2019-07-02 MED ORDER — ONDANSETRON HCL 4 MG PO TABS: 4.0000 mg | ORAL_TABLET | Freq: Four times a day (QID) | ORAL | Status: DC | PRN

## 2019-07-02 MED ORDER — SODIUM CHLORIDE 0.9 % IV SOLN: INTRAVENOUS | Status: DC

## 2019-07-02 MED ORDER — LORAZEPAM 2 MG/ML IJ SOLN: 1.0000 mg | INTRAMUSCULAR | Status: DC | PRN

## 2019-07-02 MED ORDER — LORAZEPAM 1 MG PO TABS
1.0000 mg | ORAL_TABLET | ORAL | Status: DC | PRN
  Administered 2019-07-02: 1 mg via ORAL
  Filled 2019-07-02: qty 1

## 2019-07-02 MED ORDER — DEXTROSE IN LACTATED RINGERS 5 % IV SOLN: INTRAVENOUS | Status: DC

## 2019-07-02 MED ORDER — SODIUM CHLORIDE 0.9% FLUSH
3.0000 mL | Freq: Two times a day (BID) | INTRAVENOUS | Status: DC
  Administered 2019-07-03: 3 mL via INTRAVENOUS

## 2019-07-02 MED ORDER — ONDANSETRON HCL 4 MG/2ML IJ SOLN: 4.0000 mg | Freq: Four times a day (QID) | INTRAMUSCULAR | Status: DC | PRN

## 2019-07-02 MED ORDER — SODIUM CHLORIDE (PF) 0.9 % IJ SOLN
INTRAMUSCULAR | Status: AC
  Administered 2019-07-02: 3 mL via INTRAVENOUS
  Filled 2019-07-02: qty 50

## 2019-07-02 MED ORDER — LORAZEPAM 2 MG/ML IJ SOLN
0.0000 mg | INTRAMUSCULAR | Status: DC
  Administered 2019-07-02: 1 mg via INTRAVENOUS
  Administered 2019-07-03: 2 mg via INTRAVENOUS
  Administered 2019-07-03: 1 mg via INTRAVENOUS
  Administered 2019-07-03 (×2): 2 mg via INTRAVENOUS
  Administered 2019-07-04 (×2): 1 mg via INTRAVENOUS
  Filled 2019-07-02 (×7): qty 1

## 2019-07-02 MED ORDER — IOHEXOL 300 MG/ML  SOLN
100.0000 mL | Freq: Once | INTRAMUSCULAR | Status: AC | PRN
  Administered 2019-07-02: 100 mL via INTRAVENOUS

## 2019-07-02 MED ORDER — LORAZEPAM 1 MG PO TABS
1.0000 mg | ORAL_TABLET | Freq: Once | ORAL | Status: AC
  Administered 2019-07-02: 1 mg via ORAL

## 2019-07-02 MED ORDER — SODIUM CHLORIDE 0.9 % IV SOLN
2.0000 g | INTRAVENOUS | Status: DC
  Administered 2019-07-02: 2 g via INTRAVENOUS
  Filled 2019-07-02: qty 20
  Filled 2019-07-02: qty 2

## 2019-07-02 MED ORDER — ACETAMINOPHEN 650 MG RE SUPP: 650.0000 mg | Freq: Four times a day (QID) | RECTAL | Status: DC | PRN

## 2019-07-02 NOTE — H&P (Signed)
History and Physical    Barry Moore [id number] DOB: 25-Feb-1966 DOA: 07/01/2019  PCP: Patient, No Pcp Per   Patient coming from: Home   Chief Complaint: Throwing up blood, black stools   HPI: Barry Moore is a 54 y.o. male with medical history significant for alcohol dependence, cirrhosis, chronic anemia and thrombocytopenia, now presenting to the emergency department with hematemesis and melena.  Patient reports that he developed some pain in the mid abdomen that he attributes to a fall 2 days ago, otherwise had been in his usual state until he developed hematemesis and black stool yesterday.  He reports additional episodes of hematemesis, denies lightheadedness or loss of consciousness, and denies chest pain.  He reports daily alcohol use, unable to quantify but states that he drinks "a lot."  He denies any fevers, chills, or shortness of breath.  ED Course: Upon arrival to the ED, patient is found to be afebrile, saturating mid 90s on room air, and with systolic blood pressure in the low 100s.  EKG features sinus rhythm with rate 80 and QTc interval 445 ms.  Chemistry panel with AST of 118, normal ALT, and normal bilirubin.  CBC notable for platelets of 75,000 and hemoglobin 6.8, down from 7.0 earlier this month.  INR is 1.1.  Fecal occult blood testing is positive.  Patient was started on IV Protonix and octreotide infusions, given prophylactic Rocephin, and had 2 units of RBC ordered.  Covid screening test was ordered by the ED physician and gastroenterology was consulted.  Hospitalist asked to admit.  Review of Systems:  All other systems reviewed and apart from HPI, are negative.  Past Medical History:  Diagnosis Date  . Alcoholic hepatitis   . Depression   . ETOH abuse   . Gastric bleed 08/2018  . Hypertension   . Pancreatitis   . Seizures ([place])   . Suicidal behavior     Past Surgical History:  Procedure Laterality Date  . BIOPSY  08/05/2018   Procedure: BIOPSY;   Surgeon: Rush Landmark Telford Nab., MD;  Location: [facility];  Service: Gastroenterology;;  . BIOPSY  08/07/2018   Procedure: BIOPSY;  Surgeon: Thornton Park, MD;  Location: [facility] ENDOSCOPY;  Service: Gastroenterology;;  . BIOPSY  01/02/2019   Procedure: BIOPSY;  Surgeon: Gatha Mayer, MD;  Location: [facility] ENDOSCOPY;  Service: Endoscopy;;  . COLONOSCOPY WITH PROPOFOL N/A 08/07/2018   Procedure: COLONOSCOPY WITH PROPOFOL;  Surgeon: Thornton Park, MD;  Location: [place];  Service: Gastroenterology;  Laterality: N/A;  . ESOPHAGOGASTRODUODENOSCOPY N/A 01/02/2019   Procedure: ESOPHAGOGASTRODUODENOSCOPY (EGD);  Surgeon: Gatha Mayer, MD;  Location: [facility];  Service: Endoscopy;  Laterality: N/A;  . ESOPHAGOGASTRODUODENOSCOPY (EGD) WITH PROPOFOL N/A 11/02/2016   Procedure: ESOPHAGOGASTRODUODENOSCOPY (EGD) WITH PROPOFOL;  Surgeon: Carol Ada, MD;  Location: [facility] ENDOSCOPY;  Service: Endoscopy;  Laterality: N/A;  . ESOPHAGOGASTRODUODENOSCOPY (EGD) WITH PROPOFOL N/A 08/05/2018   Procedure: ESOPHAGOGASTRODUODENOSCOPY (EGD) WITH PROPOFOL;  Surgeon: Rush Landmark Telford Nab., MD;  Location: [place];  Service: Gastroenterology;  Laterality: N/A;  . HERNIA REPAIR    . LEG SURGERY    . POLYPECTOMY  08/07/2018   Procedure: POLYPECTOMY;  Surgeon: Thornton Park, MD;  Location: [place];  Service: Gastroenterology;;     reports that he has been smoking cigarettes. He has been smoking about 1.00 pack per day. He has never used smokeless tobacco. He reports current alcohol use. He reports previous drug use. Frequency: 3.00 times per week. Drug: Cocaine.  Allergies  Allergen Reactions  . Tomato Shortness  Of Breath and Nausea And Vomiting  . Aspirin Nausea And Vomiting  . Sulfa Antibiotics     Family History  Problem Relation Age of Onset  . Diabetes Mother   . Alcoholism Mother   . Emphysema Father   . Lung cancer Father   . Alcoholism Father      Prior to Admission  medications   Medication Sig Start Date End Date Taking? Authorizing Provider  folic acid (FOLVITE) 1 MG tablet Take 1 tablet (1 mg total) by mouth daily. Patient not taking: Reported on 07/02/2019 01/04/19   Desiree Hane, MD  Multiple Vitamin (MULTIVITAMIN WITH MINERALS) TABS tablet Take 1 tablet by mouth daily. Patient not taking: Reported on 07/02/2019 01/04/19   Oretha Milch D, MD  thiamine 100 MG tablet Take 1 tablet (100 mg total) by mouth daily. Patient not taking: Reported on 07/02/2019 01/04/19   Oretha Milch D, MD  venlafaxine XR (EFFEXOR XR) 75 MG 24 hr capsule One every morning Patient not taking: Reported on 07/02/2019 02/01/19   Antony Blackbird, MD  ferrous sulfate 325 (65 FE) MG tablet Take 1 tablet (325 mg total) by mouth daily with breakfast. Patient not taking: Reported on 09/23/2018 08/09/18 10/09/18  Elodia Florence., MD    Physical Exam: Vitals:   07/02/19 0200 07/02/19 0249 07/02/19 0317 07/02/19 0330  BP: 113/63 115/67 124/71 132/80  Pulse: 92 89 87 85  Resp: 15  18 17   Temp:  98 F (36.7 C) 98 F (36.7 C)   TempSrc:  Oral Oral   SpO2: 98% 97% 100% 100%  Weight:      Height:        Constitutional: NAD, calm  Eyes: PERTLA, lids and conjunctivae normal ENMT: Mucous membranes are moist. Posterior pharynx clear of any exudate or lesions.   Neck: normal, supple, no masses, no thyromegaly Respiratory:  no wheezing, no crackles. No accessory muscle use.  Cardiovascular: S1 & S2 heard, regular rate and rhythm. No extremity edema.   Abdomen: No distension, no tenderness, soft. Bowel sounds active.  Musculoskeletal: no clubbing / cyanosis. No joint deformity upper and lower extremities.   Skin: no significant rashes, lesions, ulcers. Warm, dry, well-perfused. Neurologic: CN 2-12 grossly intact. Sensation intact. Moving all extremities.  Psychiatric: Sleeping, wakes to voice, oriented to person, place, and situation. Pleasant and cooperative.    Labs and  Imaging on Admission: I have personally reviewed following labs and imaging studies  CBC: Recent Labs  Lab 07/01/19 2331 07/01/19 2351  WBC 4.3  --   NEUTROABS 2.3  --   HGB 6.8* 8.8*  HCT 24.1* 26.0*  MCV 71.9*  --   PLT 75*  --    Basic Metabolic Panel: Recent Labs  Lab 07/01/19 2331 07/01/19 2351  NA 136 137  K 3.8 3.8  CL 102 101  CO2 25  --   GLUCOSE 100* 93  BUN <5* <3*  CREATININE 0.60* 1.00  CALCIUM 8.6*  --    GFR: Estimated Creatinine Clearance: 81.5 mL/min (by C-G formula based on SCr of 1 mg/dL). Liver Function Tests: Recent Labs  Lab 07/01/19 2331  AST 118*  ALT 32  ALKPHOS 141*  BILITOT 0.7  PROT 8.2*  ALBUMIN 3.7   Recent Labs  Lab 07/01/19 2331  LIPASE 50   Recent Labs  Lab 07/01/19 2331  AMMONIA 29   Coagulation Profile: Recent Labs  Lab 07/01/19 2331  INR 1.1   Cardiac Enzymes: No results for input(s): CKTOTAL,  CKMB, CKMBINDEX, TROPONINI in the last 168 hours. BNP (last 3 results) No results for input(s): PROBNP in the last 8760 hours. HbA1C: No results for input(s): HGBA1C in the last 72 hours. CBG: No results for input(s): GLUCAP in the last 168 hours. Lipid Profile: No results for input(s): CHOL, HDL, LDLCALC, TRIG, CHOLHDL, LDLDIRECT in the last 72 hours. Thyroid Function Tests: No results for input(s): TSH, T4TOTAL, FREET4, T3FREE, THYROIDAB in the last 72 hours. Anemia Panel: No results for input(s): VITAMINB12, FOLATE, FERRITIN, TIBC, IRON, RETICCTPCT in the last 72 hours. Urine analysis:    Component Value Date/Time   COLORURINE STRAW (A) 04/28/2019 1623   APPEARANCEUR CLEAR 04/28/2019 1623   LABSPEC 1.002 (L) 04/28/2019 1623   PHURINE 5.0 04/28/2019 1623   GLUCOSEU NEGATIVE 04/28/2019 1623   HGBUR SMALL (A) 04/28/2019 1623   BILIRUBINUR NEGATIVE 04/28/2019 1623   KETONESUR NEGATIVE 04/28/2019 1623   PROTEINUR NEGATIVE 04/28/2019 1623   NITRITE NEGATIVE 04/28/2019 1623   LEUKOCYTESUR NEGATIVE 04/28/2019 1623    Sepsis Labs: @LABRCNTIP (procalcitonin:4,lacticidven:4) )No results found for this or any previous visit (from the past 240 hour(s)).   Radiological Exams on Admission: CT HEAD WO CONTRAST  Result Date: 07/02/2019 CLINICAL DATA:  Altered mental status EXAM: CT HEAD WITHOUT CONTRAST TECHNIQUE: Contiguous axial images were obtained from the base of the skull through the vertex without intravenous contrast. COMPARISON:  06/21/2019 head CT FINDINGS: Brain: No acute territorial infarction, hemorrhage, or intracranial mass. Moderate atrophy somewhat advanced for age. Mild hypodensity in the white matter consistent with chronic small vessel ischemic change. Stable ventricle size Vascular: No hyperdense vessels.  Carotid vascular calcification Skull: Normal. Negative for fracture or focal lesion. Sinuses/Orbits: Mucosal thickening in the sinuses Other: None IMPRESSION: 1. No CT evidence for acute intracranial abnormality. 2. Advanced for age atrophy. Mild chronic small vessel ischemic change of the white matter Electronically Signed   By: Donavan Foil M.D.   On: 07/02/2019 01:32   CT CHEST W CONTRAST  Result Date: 07/02/2019 CLINICAL DATA:  Status post trauma. EXAM: CT CHEST, ABDOMEN, AND PELVIS WITH CONTRAST TECHNIQUE: Multidetector CT imaging of the chest, abdomen and pelvis was performed following the standard protocol during bolus administration of intravenous contrast. CONTRAST:  163mL OMNIPAQUE IOHEXOL 300 MG/ML  SOLN COMPARISON:  April 28, 2019 FINDINGS: CT CHEST FINDINGS Cardiovascular: No significant vascular findings. Normal heart size. No pericardial effusion. Moderate to marked severity coronary artery calcification is seen. Mediastinum/Nodes: No enlarged mediastinal, hilar, or axillary lymph nodes. Thyroid gland, trachea, and esophagus demonstrate no significant findings. Lungs/Pleura: Small patchy areas of low attenuation are seen within the anterolateral aspect of the right upper lobe. The  largest area measures approximately 1.7 cm x 1.5 cm, while the adjacent areas measure less than 1 cm. There is no evidence of a pleural effusion or pneumothorax. Musculoskeletal: A chronic fracture deformity is seen involving the mid left clavicle. CT ABDOMEN PELVIS FINDINGS Hepatobiliary: No focal liver abnormality is seen. No gallstones, gallbladder wall thickening, or biliary dilatation. Pancreas: Unremarkable. No pancreatic ductal dilatation or surrounding inflammatory changes. Spleen: Normal in size without focal abnormality. Adrenals/Urinary Tract: Adrenal glands are unremarkable. Kidneys are normal, without renal calculi, focal lesion, or hydronephrosis. Bladder is unremarkable. Stomach/Bowel: Stomach is within normal limits. Appendix appears normal. No evidence of bowel wall thickening, distention, or inflammatory changes. Vascular/Lymphatic: Moderate severity aortic calcification. No enlarged abdominal or pelvic lymph nodes. Reproductive: Prostate is unremarkable. Other: Very mild nonspecific mesenteric inflammatory fat stranding is seen within the mid right  abdomen. This is a adjacent to the anteromedial aspect of the right kidney and posterior to the hepatic flexure. This is seen on the prior study and is decreased in severity on the current exam. Musculoskeletal: No acute or significant osseous findings. IMPRESSION: 1. Small patchy areas of low attenuation within the anterolateral aspect of the right upper lobe, as described above. While these may represent areas of focal atelectasis, sequelae associated with pulmonary contusion cannot be excluded. 2. Very mild, chronic nonspecific mesenteric inflammatory fat stranding within the mid right abdomen. This is decreased in severity when compared to the prior study. 3. Moderate to marked severity coronary artery calcification. 4. Moderate severity aortic calcification. 5. Chronic fracture deformity involving the mid left clavicle. Aortic Atherosclerosis  (ICD10-I70.0). Electronically Signed   By: Virgina Norfolk M.D.   On: 07/02/2019 01:42   CT ABDOMEN PELVIS W CONTRAST  Result Date: 07/02/2019 CLINICAL DATA:  Status post trauma. EXAM: CT CHEST, ABDOMEN, AND PELVIS WITH CONTRAST TECHNIQUE: Multidetector CT imaging of the chest, abdomen and pelvis was performed following the standard protocol during bolus administration of intravenous contrast. CONTRAST:  147mL OMNIPAQUE IOHEXOL 300 MG/ML  SOLN COMPARISON:  April 28, 2019 FINDINGS: CT CHEST FINDINGS Cardiovascular: No significant vascular findings. Normal heart size. No pericardial effusion. Moderate to marked severity coronary artery calcification is seen. Mediastinum/Nodes: No enlarged mediastinal, hilar, or axillary lymph nodes. Thyroid gland, trachea, and esophagus demonstrate no significant findings. Lungs/Pleura: Small patchy areas of low attenuation are seen within the anterolateral aspect of the right upper lobe. The largest area measures approximately 1.7 cm x 1.5 cm, while the adjacent areas measure less than 1 cm. There is no evidence of a pleural effusion or pneumothorax. Musculoskeletal: A chronic fracture deformity is seen involving the mid left clavicle. CT ABDOMEN PELVIS FINDINGS Hepatobiliary: No focal liver abnormality is seen. No gallstones, gallbladder wall thickening, or biliary dilatation. Pancreas: Unremarkable. No pancreatic ductal dilatation or surrounding inflammatory changes. Spleen: Normal in size without focal abnormality. Adrenals/Urinary Tract: Adrenal glands are unremarkable. Kidneys are normal, without renal calculi, focal lesion, or hydronephrosis. Bladder is unremarkable. Stomach/Bowel: Stomach is within normal limits. Appendix appears normal. No evidence of bowel wall thickening, distention, or inflammatory changes. Vascular/Lymphatic: Moderate severity aortic calcification. No enlarged abdominal or pelvic lymph nodes. Reproductive: Prostate is unremarkable. Other: Very  mild nonspecific mesenteric inflammatory fat stranding is seen within the mid right abdomen. This is a adjacent to the anteromedial aspect of the right kidney and posterior to the hepatic flexure. This is seen on the prior study and is decreased in severity on the current exam. Musculoskeletal: No acute or significant osseous findings. IMPRESSION: 1. Small patchy areas of low attenuation within the anterolateral aspect of the right upper lobe, as described above. While these may represent areas of focal atelectasis, sequelae associated with pulmonary contusion cannot be excluded. 2. Very mild, chronic nonspecific mesenteric inflammatory fat stranding within the mid right abdomen. This is decreased in severity when compared to the prior study. 3. Moderate to marked severity coronary artery calcification. 4. Moderate severity aortic calcification. 5. Chronic fracture deformity involving the mid left clavicle. Electronically Signed   By: Virgina Norfolk M.D.   On: 07/02/2019 01:43    EKG: Independently reviewed. Sinus rhythm, rate 80, QTc 445 ms.   Assessment/Plan   1. Acute upper GI bleeding; microcytic anemia   - Presents with one day of hematemesis and black stool, has hx of alcoholic cirrhosis with hx of grade 1 esophageal varices, duodenal ulcers,  portal hypertensive gastropathy on prior EGDs, is hemodynamically stable in ED, and has initial Hgb of 6.8, down from 7.0 a few days earlier  - GI consulting and much appreciated  - 2 units RBC ordered from ED and he was started IV PPI, octreotide infusion, and prophylactic Rocephin  - Continue PPI and octreotide, continue Rocephin, check post-transfusion CBC    2. Alcohol dependence  - Patient reports excessive daily EtOH use, has difficulty quantifying but assures [name] "it's a lot"  - He is intoxicated on arrival to ED  - Supplement vitamins, monitor electrolytes, monitor with CIWA, and use Ativan as needed   3. Thrombocytopenia  - Platelets 75k on  admission, appear chronically low, most likely from chronic alcohol abuse  - Keep platelets >50k while bleeding    DVT prophylaxis: SCDs Code Status: Full  Family Communication: Discussed with patient  Disposition Plan: Pending clearance from GI, cessation of bleeding, stable H&H  Consults called: GI consulted by ED physician  Admission status: Inpatient     Vianne Bulls, MD [facility] Pager: See [email or web address]  If 7AM-7PM, please contact the daytime attending [email or web address]  07/02/2019, 3:56 AM

## 2019-07-02 NOTE — ED Notes (Signed)
2nd unit blood completed without incident. No reaction noted. Pt is A/Ox3. Skin w/d/pink. Resp wnl, equal and non-labored. VSS. Cont to monitor.

## 2019-07-02 NOTE — ED Notes (Signed)
IP RN states they are with another pt. Will call back in 5 minutes. Will attempt report again if no call back.

## 2019-07-02 NOTE — ED Notes (Signed)
Patient transported to ct

## 2019-07-02 NOTE — Progress Notes (Signed)
PROGRESS NOTE    Hussien Biba  [id number] DOB: November 22, 1965 DOA: 07/01/2019 PCP: Patient, No Pcp Per    Brief Narrative:  Patient admitted to the hospital with the working diagnosis of acute blood loss anemia due to upper GI bleed.   54 year old male who presented with hematemesis and melena.  He does have significant past medical history for alcohol abuse, cirrhosis, chronic anemia and thrombocytopenia.  He reported 24 hours of hematemesis and melena, no lightheadedness or syncope.  No dyspnea.  Ongoing alcohol abuse.  On his initial physical examination blood pressure 130/63, heart rate 89, respirate 18, temperature 98, oxygen saturation 97%, his lungs are clear to auscultation bilaterally, heart S1-S2 present rhythmic, abdomen was soft nontender, mild distention, possible ascites, no lower extremity edema. Sodium 136, potassium 3.8, chloride 102, bicarb 25, glucose 100, BUN 25, creatinine 0.6, lipase 50, AST 118, ALT 32, white count 4.3, hemoglobin 6.8, hematocrit 24.1, platelets 75.  SARS COVID-19 negative.  CT head, chest and abdomen negative for acute changes.  EKG 86 bpm, normal axis, normal intervals, sinus rhythm, J-point elevation in V4 through V6, no T wave changes.  Patient had 2 units packed red blood cells transfussed, started on intravenous proton pump inhibitor and octreotide.  Prophylaxis antibiotic therapy with ceftriaxone considering ascites.  Assessment & Plan:   Principal Problem:   Acute upper GI bleeding Active Problems:   Thrombocytopenia ([facility])   Upper GI bleed   Microcytic anemia   Alcohol dependence ([place])   1. Acute blood loss anemia due to upper GI bleed. Patient with improved abdominal pain, no nausea or vomiting, this am. Continue to be NPO. Tolerated well prbc transfusion #2. Follow Hgb 8,8 and Hct 29,2.   Patient will continue pantoprazole IV and octreotide, for possible variceal bleed. Will continue prophylactic antibiotic therapy for now.   2. Alcohol  abuse. Will continue with alcohol withdrawal prophylaxis with CIWA protocol, continue neuro checks per unit protocol and multivitamins, including thiamine.   3. Chronic thrombocytopenia. Plt at 69, will continue close monitoring, keep more than 50.000.      DVT prophylaxis: scd   Code Status:  full Family Communication: no family at the bedside  Disposition Plan/ discharge barriers: patient from home, barrier for dc acute bleed, need H&H monitoring, prbc transfusion and endoscopy.     Consultants:   GI    Subjective: Patient is feeling better, abdominal pain has improved but not yet back to baseline, no nausea or vomiting, very weak and deconditioned,   Objective: Vitals:   07/02/19 1300 07/02/19 1330 07/02/19 1400 07/02/19 1430  BP: (!) 151/79 (!) 165/83 (!) 159/81 (!) 148/76  Pulse: 73 69 66 63  Resp: (!) 21 18 18 17   Temp:      TempSrc:      SpO2: 98% 98% 99% 97%  Weight:      Height:        Intake/Output Summary (Last 24 hours) at 07/02/2019 1527 Last data filed at 07/02/2019 1520 Gross per 24 hour  Intake 1898.7 ml  Output -  Net 1898.7 ml   Filed Weights   07/01/19 2211  Weight: 81.6 kg    Examination:   General: Not in pain or dyspnea, deconditioned  Neurology: Awake and alert, non focal  E ENT: positive pallor, no icterus, oral mucosa moist Cardiovascular: No JVD. S1-S2 present, rhythmic, no gallops, rubs, or murmurs. No lower extremity edema. Pulmonary: positive breath sounds bilaterally, adequate air movement, no wheezing, rhonchi or rales. Gastrointestinal. Abdomen with  no organomegaly, non tender, no rebound or guarding Skin. Abdominal eschymmosis.  Musculoskeletal: no joint deformities     Data Reviewed: I have personally reviewed following labs and imaging studies  CBC: Recent Labs  Lab 07/01/19 2331 07/01/19 2351  WBC 4.3  --   NEUTROABS 2.3  --   HGB 6.8* 8.8*  HCT 24.1* 26.0*  MCV 71.9*  --   PLT 75*  --    Basic Metabolic  Panel: Recent Labs  Lab 07/01/19 2331 07/01/19 2351  NA 136 137  K 3.8 3.8  CL 102 101  CO2 25  --   GLUCOSE 100* 93  BUN <5* <3*  CREATININE 0.60* 1.00  CALCIUM 8.6*  --    GFR: Estimated Creatinine Clearance: 81.5 mL/min (by C-G formula based on SCr of 1 mg/dL). Liver Function Tests: Recent Labs  Lab 07/01/19 2331  AST 118*  ALT 32  ALKPHOS 141*  BILITOT 0.7  PROT 8.2*  ALBUMIN 3.7   Recent Labs  Lab 07/01/19 2331  LIPASE 50   Recent Labs  Lab 07/01/19 2331  AMMONIA 29   Coagulation Profile: Recent Labs  Lab 07/01/19 2331  INR 1.1   Cardiac Enzymes: No results for input(s): CKTOTAL, CKMB, CKMBINDEX, TROPONINI in the last 168 hours. BNP (last 3 results) No results for input(s): PROBNP in the last 8760 hours. HbA1C: No results for input(s): HGBA1C in the last 72 hours. CBG: No results for input(s): GLUCAP in the last 168 hours. Lipid Profile: No results for input(s): CHOL, HDL, LDLCALC, TRIG, CHOLHDL, LDLDIRECT in the last 72 hours. Thyroid Function Tests: No results for input(s): TSH, T4TOTAL, FREET4, T3FREE, THYROIDAB in the last 72 hours. Anemia Panel: No results for input(s): VITAMINB12, FOLATE, FERRITIN, TIBC, IRON, RETICCTPCT in the last 72 hours.    Radiology Studies: I have reviewed all of the imaging during this hospital visit personally     Scheduled Meds: . LORazepam  0-4 mg Intravenous Q4H   Followed by  . [START ON 07/04/2019] LORazepam  0-4 mg Intravenous Q8H  . [START ON 07/05/2019] pantoprazole  40 mg Intravenous Q12H  . sodium chloride flush  3 mL Intravenous Q12H  . thiamine  100 mg Oral Daily   Or  . thiamine  100 mg Intravenous Daily   Continuous Infusions: . sodium chloride    . cefTRIAXone (ROCEPHIN)  IV    . octreotide  (SANDOSTATIN)    IV infusion Stopped (07/02/19 1520)  . pantoprozole (PROTONIX) infusion Stopped (07/02/19 1520)     LOS: 0 days        Avarae Zwart Gerome Apley, MD

## 2019-07-02 NOTE — Consult Note (Signed)
Referring Provider: Dr. Betsey Holiday (ED) Primary Care Physician:  Patient, No Pcp Per Primary Gastroenterologist: Althia Forts  Reason for Consultation: GI bleed  HPI: Barry Moore is a 54 y.o. male with history of alcoholic hepatitis, grade 1 esophageal varices, portal hypertensive gastropathy, esophagitis, pancreatitis, and alcohol abuse presenting with melena.  Patient reports melena starting yesterday.  He had one episode of loose, black stools.  Prior to this, he states his stools were normal.  He denies hematochezia.  He has not had a bowel movement since yesterday.  He also endorses that he "spit up" a small amount of bright red blood 3 times yesterday.  He also notes recent fatigue and dizziness.  He endorses intermittent dysphagia to solids and liquids.  He endorses early satiety.  He does not know if he has had any changes in his weight.  He denies heartburn.  He denies any abdominal pain.    He endorses chronic shortness of breath which she attributes to smoking.  He denies any chest pain.  Patient consumes "at least for 40s" daily.  He had a fall 2 days ago without loss of consciousness.  At that time, he bruised his abdomen.  Patient denies IV drug use.  He denies family history of any liver disease.  Records reviewed.  Last EGD 12/2018 showed duodenal erosion and gastritis, improved from prior EGD (07/2018).  EGD 08/05/2018 showed grade 1 esophageal varices, duodenitis (bx: peptic duodenitis), esophagitis (bx: candida), gastritis (bx: gastropathy, H. Pylori negative). Colonoscopy 08/07/2018 showed one ulcer )bx: ischemic changes) and one polyp (bx: tubular adenoma without high grade dysplasia)  Past Medical History:  Diagnosis Date  . Alcoholic hepatitis   . Depression   . ETOH abuse   . Gastric bleed 08/2018  . Hypertension   . Pancreatitis   . Seizures ([place])   . Suicidal behavior     Past Surgical History:  Procedure Laterality Date  . BIOPSY  08/05/2018   Procedure:  BIOPSY;  Surgeon: Rush Landmark Telford Nab., MD;  Location: [facility];  Service: Gastroenterology;;  . BIOPSY  08/07/2018   Procedure: BIOPSY;  Surgeon: Thornton Park, MD;  Location: [facility] ENDOSCOPY;  Service: Gastroenterology;;  . BIOPSY  01/02/2019   Procedure: BIOPSY;  Surgeon: Gatha Mayer, MD;  Location: [facility] ENDOSCOPY;  Service: Endoscopy;;  . COLONOSCOPY WITH PROPOFOL N/A 08/07/2018   Procedure: COLONOSCOPY WITH PROPOFOL;  Surgeon: Thornton Park, MD;  Location: [facility];  Service: Gastroenterology;  Laterality: N/A;  . ESOPHAGOGASTRODUODENOSCOPY N/A 01/02/2019   Procedure: ESOPHAGOGASTRODUODENOSCOPY (EGD);  Surgeon: Gatha Mayer, MD;  Location: [facility] ENDOSCOPY;  Service: Endoscopy;  Laterality: N/A;  . ESOPHAGOGASTRODUODENOSCOPY (EGD) WITH PROPOFOL N/A 11/02/2016   Procedure: ESOPHAGOGASTRODUODENOSCOPY (EGD) WITH PROPOFOL;  Surgeon: Carol Ada, MD;  Location: [facility] ENDOSCOPY;  Service: Endoscopy;  Laterality: N/A;  . ESOPHAGOGASTRODUODENOSCOPY (EGD) WITH PROPOFOL N/A 08/05/2018   Procedure: ESOPHAGOGASTRODUODENOSCOPY (EGD) WITH PROPOFOL;  Surgeon: Rush Landmark Telford Nab., MD;  Location: [facility];  Service: Gastroenterology;  Laterality: N/A;  . HERNIA REPAIR    . LEG SURGERY    . POLYPECTOMY  08/07/2018   Procedure: POLYPECTOMY;  Surgeon: Thornton Park, MD;  Location: [facility] ENDOSCOPY;  Service: Gastroenterology;;    Prior to Admission medications   Medication Sig Start Date End Date Taking? Authorizing Provider  folic acid (FOLVITE) 1 MG tablet Take 1 tablet (1 mg total) by mouth daily. Patient not taking: Reported on 07/02/2019 01/04/19   Desiree Hane, MD  Multiple Vitamin (MULTIVITAMIN WITH MINERALS) TABS tablet Take 1 tablet by  mouth daily. Patient not taking: Reported on 07/02/2019 01/04/19   Oretha Milch D, MD  thiamine 100 MG tablet Take 1 tablet (100 mg total) by mouth daily. Patient not taking: Reported on 07/02/2019 01/04/19   Oretha Milch D, MD  venlafaxine  XR (EFFEXOR XR) 75 MG 24 hr capsule One every morning Patient not taking: Reported on 07/02/2019 02/01/19   Antony Blackbird, MD  ferrous sulfate 325 (65 FE) MG tablet Take 1 tablet (325 mg total) by mouth daily with breakfast. Patient not taking: Reported on 09/23/2018 08/09/18 10/09/18  Elodia Florence., MD    Scheduled Meds: . LORazepam  0-4 mg Intravenous Q6H   Or  . LORazepam  0-4 mg Oral Q6H  . [START ON 07/04/2019] LORazepam  0-4 mg Intravenous Q12H   Or  . [START ON 07/04/2019] LORazepam  0-4 mg Oral Q12H  . [START ON 07/05/2019] pantoprazole  40 mg Intravenous Q12H  . sodium chloride (PF)      . thiamine  100 mg Oral Daily   Or  . thiamine  100 mg Intravenous Daily   Continuous Infusions: . cefTRIAXone (ROCEPHIN)  IV    . octreotide  (SANDOSTATIN)    IV infusion 50 mcg/hr (07/02/19 0027)  . pantoprozole (PROTONIX) infusion 8 mg/hr (07/02/19 0003)   PRN Meds:.  Allergies as of 07/01/2019 - Review Complete 07/01/2019  Allergen Reaction Noted  . Tomato Shortness Of Breath and Nausea And Vomiting 02/28/2017  . Aspirin Nausea And Vomiting 11/21/2015  . Sulfa antibiotics  06/21/2019    Family History  Problem Relation Age of Onset  . Diabetes Mother   . Alcoholism Mother   . Emphysema Father   . Lung cancer Father   . Alcoholism Father     Social History   Socioeconomic History  . Marital status: Divorced    Spouse name: Not on file  . Number of children: Not on file  . Years of education: Not on file  . Highest education level: Not on file  Occupational History  . Not on file  Tobacco Use  . Smoking status: Current Every Day Smoker    Packs/day: 1.00    Types: Cigarettes  . Smokeless tobacco: Never Used  Substance and Sexual Activity  . Alcohol use: Yes    Comment: 5+ BEERS DAILY  . Drug use: Not Currently    Frequency: 3.0 times per week    Types: Cocaine  . Sexual activity: Not Currently  Other Topics Concern  . Not on file  Social History Narrative   . Not on file   Social Determinants of Health   Financial Resource Strain:   . Difficulty of Paying Living Expenses:   Food Insecurity:   . Worried About Charity fundraiser in the Last Year:   . Arboriculturist in the Last Year:   Transportation Needs:   . Film/video editor (Medical):   [name] Lack of Transportation (Non-Medical):   Physical Activity:   . Days of Exercise per Week:   . Minutes of Exercise per Session:   Stress:   . Feeling of Stress :   Social Connections:   . Frequency of Communication with Friends and Family:   . Frequency of Social Gatherings with Friends and Family:   . Attends Religious Services:   . Active Member of Clubs or Organizations:   . Attends Archivist Meetings:   [name] Marital Status:   Intimate Partner Violence:   . Fear of  Current or Ex-Partner:   . Emotionally Abused:   [name] Physically Abused:   . Sexually Abused:     Review of Systems:   Review of Systems  Constitutional: Positive for malaise/fatigue. Negative for chills and fever.  HENT: Negative for hearing loss and tinnitus.   Eyes: Negative for blurred vision and pain.  Respiratory: Positive for shortness of breath. Negative for cough.   Cardiovascular: Negative for chest pain and palpitations.  Gastrointestinal: Positive for melena, nausea and vomiting. Negative for abdominal pain, blood in stool, constipation, diarrhea and heartburn.  Genitourinary: Negative for dysuria and hematuria.  Musculoskeletal: Positive for falls and joint pain (left ankle).  Skin: Negative for itching and rash.       +bruising  Neurological: Positive for dizziness. Negative for loss of consciousness.  Endo/Heme/Allergies: Negative for polydipsia. Does not bruise/bleed easily.  Psychiatric/Behavioral: Positive for substance abuse. The patient is not nervous/anxious.     Physical Exam: Vital signs: Vitals:   07/02/19 0845 07/02/19 0900  BP: (!) 154/84 (!) 150/79  Pulse: 92 83  Resp: 18 17   Temp:    SpO2: 99% 98%     Physical Exam  Constitutional: He appears well-developed and well-nourished. He appears lethargic. No distress.  disheveled  HENT:  Head: Normocephalic and atraumatic.  Eyes: No scleral icterus.  Cardiovascular: Normal rate, regular rhythm and normal heart sounds.  Pulmonary/Chest: Effort normal and breath sounds normal. No respiratory distress.  Abdominal: Soft. Bowel sounds are normal. He exhibits no distension and no mass. There is abdominal tenderness (tenderness surrounding abdominal bruises but no tenderness upon abdominal palpation). There is no rebound and no guarding.  Musculoskeletal:        General: No deformity or edema.     Cervical back: Normal range of motion and neck supple.  Neurological: He appears lethargic.  Knows location, year, and day of week.  However, reported month as March and president as Daisy Floro.  Skin: Skin is warm and dry. Bruising (3 bruises on upper to mid abdomen) noted.  Psychiatric: He has a normal mood and affect. His behavior is normal.   GI:  Lab Results: Recent Labs    07/01/19 2331 07/01/19 2351  WBC 4.3  --   HGB 6.8* 8.8*  HCT 24.1* 26.0*  PLT 75*  --    BMET Recent Labs    07/01/19 2331 07/01/19 2351  NA 136 137  K 3.8 3.8  CL 102 101  CO2 25  --   GLUCOSE 100* 93  BUN <5* <3*  CREATININE 0.60* 1.00  CALCIUM 8.6*  --    LFT Recent Labs    07/01/19 2331  PROT 8.2*  ALBUMIN 3.7  AST 118*  ALT 32  ALKPHOS 141*  BILITOT 0.7  BILIDIR 0.3*  IBILI 0.4   PT/INR Recent Labs    07/01/19 2331  LABPROT 14.3  INR 1.1     Studies/Results: CT HEAD WO CONTRAST  Result Date: 07/02/2019 CLINICAL DATA:  Altered mental status EXAM: CT HEAD WITHOUT CONTRAST TECHNIQUE: Contiguous axial images were obtained from the base of the skull through the vertex without intravenous contrast. COMPARISON:  06/21/2019 head CT FINDINGS: Brain: No acute territorial infarction, hemorrhage, or intracranial  mass. Moderate atrophy somewhat advanced for age. Mild hypodensity in the white matter consistent with chronic small vessel ischemic change. Stable ventricle size Vascular: No hyperdense vessels.  Carotid vascular calcification Skull: Normal. Negative for fracture or focal lesion. Sinuses/Orbits: Mucosal thickening in the sinuses Other: None IMPRESSION:  1. No CT evidence for acute intracranial abnormality. 2. Advanced for age atrophy. Mild chronic small vessel ischemic change of the white matter Electronically Signed   By: Donavan Foil M.D.   On: 07/02/2019 01:32   CT CHEST W CONTRAST  Result Date: 07/02/2019 CLINICAL DATA:  Status post trauma. EXAM: CT CHEST, ABDOMEN, AND PELVIS WITH CONTRAST TECHNIQUE: Multidetector CT imaging of the chest, abdomen and pelvis was performed following the standard protocol during bolus administration of intravenous contrast. CONTRAST:  139m OMNIPAQUE IOHEXOL 300 MG/ML  SOLN COMPARISON:  April 28, 2019 FINDINGS: CT CHEST FINDINGS Cardiovascular: No significant vascular findings. Normal heart size. No pericardial effusion. Moderate to marked severity coronary artery calcification is seen. Mediastinum/Nodes: No enlarged mediastinal, hilar, or axillary lymph nodes. Thyroid gland, trachea, and esophagus demonstrate no significant findings. Lungs/Pleura: Small patchy areas of low attenuation are seen within the anterolateral aspect of the right upper lobe. The largest area measures approximately 1.7 cm x 1.5 cm, while the adjacent areas measure less than 1 cm. There is no evidence of a pleural effusion or pneumothorax. Musculoskeletal: A chronic fracture deformity is seen involving the mid left clavicle. CT ABDOMEN PELVIS FINDINGS Hepatobiliary: No focal liver abnormality is seen. No gallstones, gallbladder wall thickening, or biliary dilatation. Pancreas: Unremarkable. No pancreatic ductal dilatation or surrounding inflammatory changes. Spleen: Normal in size without focal  abnormality. Adrenals/Urinary Tract: Adrenal glands are unremarkable. Kidneys are normal, without renal calculi, focal lesion, or hydronephrosis. Bladder is unremarkable. Stomach/Bowel: Stomach is within normal limits. Appendix appears normal. No evidence of bowel wall thickening, distention, or inflammatory changes. Vascular/Lymphatic: Moderate severity aortic calcification. No enlarged abdominal or pelvic lymph nodes. Reproductive: Prostate is unremarkable. Other: Very mild nonspecific mesenteric inflammatory fat stranding is seen within the mid right abdomen. This is a adjacent to the anteromedial aspect of the right kidney and posterior to the hepatic flexure. This is seen on the prior study and is decreased in severity on the current exam. Musculoskeletal: No acute or significant osseous findings. IMPRESSION: 1. Small patchy areas of low attenuation within the anterolateral aspect of the right upper lobe, as described above. While these may represent areas of focal atelectasis, sequelae associated with pulmonary contusion cannot be excluded. 2. Very mild, chronic nonspecific mesenteric inflammatory fat stranding within the mid right abdomen. This is decreased in severity when compared to the prior study. 3. Moderate to marked severity coronary artery calcification. 4. Moderate severity aortic calcification. 5. Chronic fracture deformity involving the mid left clavicle. Aortic Atherosclerosis (ICD10-I70.0). Electronically Signed   By: TVirgina NorfolkM.D.   On: 07/02/2019 01:42   CT ABDOMEN PELVIS W CONTRAST  Result Date: 07/02/2019 CLINICAL DATA:  Status post trauma. EXAM: CT CHEST, ABDOMEN, AND PELVIS WITH CONTRAST TECHNIQUE: Multidetector CT imaging of the chest, abdomen and pelvis was performed following the standard protocol during bolus administration of intravenous contrast. CONTRAST:  1040mOMNIPAQUE IOHEXOL 300 MG/ML  SOLN COMPARISON:  April 28, 2019 FINDINGS: CT CHEST FINDINGS Cardiovascular:  No significant vascular findings. Normal heart size. No pericardial effusion. Moderate to marked severity coronary artery calcification is seen. Mediastinum/Nodes: No enlarged mediastinal, hilar, or axillary lymph nodes. Thyroid gland, trachea, and esophagus demonstrate no significant findings. Lungs/Pleura: Small patchy areas of low attenuation are seen within the anterolateral aspect of the right upper lobe. The largest area measures approximately 1.7 cm x 1.5 cm, while the adjacent areas measure less than 1 cm. There is no evidence of a pleural effusion or pneumothorax. Musculoskeletal: A chronic fracture  deformity is seen involving the mid left clavicle. CT ABDOMEN PELVIS FINDINGS Hepatobiliary: No focal liver abnormality is seen. No gallstones, gallbladder wall thickening, or biliary dilatation. Pancreas: Unremarkable. No pancreatic ductal dilatation or surrounding inflammatory changes. Spleen: Normal in size without focal abnormality. Adrenals/Urinary Tract: Adrenal glands are unremarkable. Kidneys are normal, without renal calculi, focal lesion, or hydronephrosis. Bladder is unremarkable. Stomach/Bowel: Stomach is within normal limits. Appendix appears normal. No evidence of bowel wall thickening, distention, or inflammatory changes. Vascular/Lymphatic: Moderate severity aortic calcification. No enlarged abdominal or pelvic lymph nodes. Reproductive: Prostate is unremarkable. Other: Very mild nonspecific mesenteric inflammatory fat stranding is seen within the mid right abdomen. This is a adjacent to the anteromedial aspect of the right kidney and posterior to the hepatic flexure. This is seen on the prior study and is decreased in severity on the current exam. Musculoskeletal: No acute or significant osseous findings. IMPRESSION: 1. Small patchy areas of low attenuation within the anterolateral aspect of the right upper lobe, as described above. While these may represent areas of focal atelectasis, sequelae  associated with pulmonary contusion cannot be excluded. 2. Very mild, chronic nonspecific mesenteric inflammatory fat stranding within the mid right abdomen. This is decreased in severity when compared to the prior study. 3. Moderate to marked severity coronary artery calcification. 4. Moderate severity aortic calcification. 5. Chronic fracture deformity involving the mid left clavicle. Electronically Signed   By: Virgina Norfolk M.D.   On: 07/02/2019 01:43    Impression/Plan: Melena.  Differential diagnosis includes portal hypertensive gastropathy versus alcoholic gastritis.  Do not suspect variceal bleeding at this time, as patient has remained hemodynamically stable has not had any bowel movements or vomiting since yesterday.  Acute on chronic anemia, likely related to chronic GI blood loss.  Hemoglobin 6.8 on arrival (4/11).  Hemoglobin on 4/1 was 7.0 and 9 3/26 was 7.2.  Hemodynamically stable with normotensive to mildly increased BP.  Alcohol abuse.  Daily use.  History of alcoholic hepatitis with probable cirrhosis due to presence of esophageal varices.  AST 118/ALT 32/alk phos 141/T bili 0.7.  Plan: We will plan for EGD tomorrow.  I thoroughly discussed the procedure, benefits, and risks (including bleeding, perforation, infection, anesthesia) with the patient.  Patient was given the opportunity to ask questions.  Patient would like to proceed with the procedure.  Clear liquid diet with n.p.o. after midnight.  Monitor H&H.  Transfuse as needed to maintain hemoglobin greater than 7.  Continue Protonix IV.  Continue octreotide for now.  In regard to alcohol abuse, discussed importance of complete cessation in the setting of liver disease.  Patient states he does not think he can completely stop using alcohol but will try to decrease usage.  Recommend resources for alcohol cessation.  CIWA protocol in place.   LOS: 0 days   Salley Slaughter  07/02/2019, 9:36 AM  Questions please  call [phone]

## 2019-07-03 ENCOUNTER — Encounter (HOSPITAL_COMMUNITY): Payer: Self-pay | Admitting: Internal Medicine

## 2019-07-03 ENCOUNTER — Other Ambulatory Visit: Payer: Self-pay

## 2019-07-03 ENCOUNTER — Inpatient Hospital Stay (HOSPITAL_COMMUNITY): Payer: Self-pay | Admitting: Certified Registered Nurse Anesthetist

## 2019-07-03 ENCOUNTER — Encounter (HOSPITAL_COMMUNITY)
Admission: EM | Disposition: A | Discharge status: Home / Self Care | Payer: Self-pay | Source: Home / Self Care | Attending: Internal Medicine

## 2019-07-03 HISTORY — PX: ESOPHAGOGASTRODUODENOSCOPY: SHX5428

## 2019-07-03 LAB — CBC WITH DIFFERENTIAL/PLATELET
Abs Immature Granulocytes: 0.02 10*3/uL (ref 0.00–0.07)
Basophils Absolute: 0 10*3/uL (ref 0.0–0.1)
Basophils Relative: 1 %
Eosinophils Absolute: 0.1 10*3/uL (ref 0.0–0.5)
Eosinophils Relative: 2 %
HCT: 29.4 % — ABNORMAL LOW (ref 39.0–52.0)
Hemoglobin: 8.9 g/dL — ABNORMAL LOW (ref 13.0–17.0)
Immature Granulocytes: 0 %
Lymphocytes Relative: 22 %
Lymphs Abs: 1 10*3/uL (ref 0.7–4.0)
MCH: 22.4 pg — ABNORMAL LOW (ref 26.0–34.0)
MCHC: 30.3 g/dL (ref 30.0–36.0)
MCV: 74.1 fL — ABNORMAL LOW (ref 80.0–100.0)
Monocytes Absolute: 0.7 10*3/uL (ref 0.1–1.0)
Monocytes Relative: 16 %
Neutro Abs: 2.8 10*3/uL (ref 1.7–7.7)
Neutrophils Relative %: 59 %
Platelets: 69 10*3/uL — ABNORMAL LOW (ref 150–400)
RBC: 3.97 MIL/uL — ABNORMAL LOW (ref 4.22–5.81)
RDW: 22.6 % — ABNORMAL HIGH (ref 11.5–15.5)
WBC: 4.7 10*3/uL (ref 4.0–10.5)
nRBC: 0 % (ref 0.0–0.2)

## 2019-07-03 LAB — TYPE AND SCREEN
ABO/RH(D): O POS
Antibody Screen: NEGATIVE
Unit division: 0
Unit division: 0

## 2019-07-03 LAB — BPAM RBC
Blood Product Expiration Date: 202105062359
Blood Product Expiration Date: 202105062359
ISSUE DATE / TIME: 202104120259
ISSUE DATE / TIME: 202104120547
Unit Type and Rh: 5100
Unit Type and Rh: 5100

## 2019-07-03 LAB — BASIC METABOLIC PANEL
Anion gap: 13 (ref 5–15)
BUN: 8 mg/dL (ref 6–20)
CO2: 24 mmol/L (ref 22–32)
Calcium: 8.6 mg/dL — ABNORMAL LOW (ref 8.9–10.3)
Chloride: 95 mmol/L — ABNORMAL LOW (ref 98–111)
Creatinine, Ser: 0.64 mg/dL (ref 0.61–1.24)
GFR calc Af Amer: >60 mL/min (ref >60)
GFR calc non Af Amer: >60 mL/min (ref >60)
Glucose, Bld: 113 mg/dL — ABNORMAL HIGH (ref 70–99)
Potassium: 3.4 mmol/L — ABNORMAL LOW (ref 3.5–5.1)
Sodium: 132 mmol/L — ABNORMAL LOW (ref 135–145)

## 2019-07-03 SURGERY — EGD (ESOPHAGOGASTRODUODENOSCOPY)
Anesthesia: Monitor Anesthesia Care

## 2019-07-03 MED ORDER — PROPOFOL 10 MG/ML IV BOLUS
INTRAVENOUS | Status: DC | PRN
  Administered 2019-07-03 (×2): 30 mg via INTRAVENOUS
  Administered 2019-07-03: 40 mg via INTRAVENOUS

## 2019-07-03 MED ORDER — LACTATED RINGERS IV SOLN
INTRAVENOUS | Status: DC
  Administered 2019-07-03: 1000 mL via INTRAVENOUS

## 2019-07-03 MED ORDER — PANTOPRAZOLE SODIUM 40 MG PO TBEC
40.0000 mg | DELAYED_RELEASE_TABLET | Freq: Every day | ORAL | Status: DC
  Administered 2019-07-03: 40 mg via ORAL
  Filled 2019-07-03: qty 1

## 2019-07-03 MED ORDER — SODIUM CHLORIDE 0.9 % IV SOLN: INTRAVENOUS | Status: DC

## 2019-07-03 MED ORDER — PROPOFOL 500 MG/50ML IV EMUL
INTRAVENOUS | Status: DC | PRN
  Administered 2019-07-03: 125 ug/kg/min via INTRAVENOUS

## 2019-07-03 MED ORDER — PROPOFOL 1000 MG/100ML IV EMUL
INTRAVENOUS | Status: AC
  Filled 2019-07-03: qty 100

## 2019-07-03 MED ORDER — LIDOCAINE 2% (20 MG/ML) 5 ML SYRINGE
INTRAMUSCULAR | Status: DC | PRN
  Administered 2019-07-03: 80 mg via INTRAVENOUS

## 2019-07-03 MED ORDER — HYDROCHLOROTHIAZIDE 25 MG PO TABS
25.0000 mg | ORAL_TABLET | Freq: Every day | ORAL | Status: DC
  Administered 2019-07-03: 25 mg via ORAL
  Filled 2019-07-03: qty 1

## 2019-07-03 NOTE — Progress Notes (Signed)
PROGRESS NOTE    Barry Moore  [id number] DOB: Jul 25, 1965 DOA: 07/01/2019 PCP: Patient, No Pcp Per    Brief Narrative:  Patient admitted to the hospital with the working diagnosis of acute blood loss anemia due to upper GI bleed.   54 year old male who presented with hematemesis and melena.  He does have significant past medical history for alcohol abuse, cirrhosis, chronic anemia and thrombocytopenia.  He reported 24 hours of hematemesis and melena, no lightheadedness or syncope.  No dyspnea.  Ongoing alcohol abuse.  On his initial physical examination blood pressure 130/63, heart rate 89, respirate 18, temperature 98, oxygen saturation 97%, his lungs are clear to auscultation bilaterally, heart S1-S2 present rhythmic, abdomen was soft nontender, mild distention, possible ascites, no lower extremity edema. Sodium 136, potassium 3.8, chloride 102, bicarb 25, glucose 100, BUN 25, creatinine 0.6, lipase 50, AST 118, ALT 32, white count 4.3, hemoglobin 6.8, hematocrit 24.1, platelets 75.  SARS COVID-19 negative.  CT head, chest and abdomen negative for acute changes.  EKG 86 bpm, normal axis, normal intervals, sinus rhythm, J-point elevation in V4 through V6, no T wave changes.  Patient had 2 units packed red blood cells transfussed, started on intravenous proton pump inhibitor and octreotide.  Prophylaxis antibiotic therapy with ceftriaxone considering ascites.  Patient underwent EGD today noted erythematous mucosa in the antrum, and erythematous duodenopathy, no esophageal varices.    Assessment & Plan:   Principal Problem:   Acute upper GI bleeding Active Problems:   Thrombocytopenia ([facility])   Upper GI bleed   Microcytic anemia   Acute GI bleeding   Alcohol dependence ([place])    1. Acute blood loss anemia due to upper GI bleed. Sp prbc transfusion #2. Follow Hgb 8,9 and Hct 29,4. No further bleeding. Upper endoscopy with no ulcers but erythematous antrum and duodenum. No esophageal  varices.    Will advance diet to regular, change pantoprazole to po, dc octreotide and ceftriaxone. Follow cell count in am.   Plan for dc home in am.   2. Alcohol abuse. On alcohol withdrawal prophylaxis with CIWA protocol, no signs of active withdrawal.  Neuro checks per unit protocol and multivitamins, including thiamine.   Out of bed to chair, ambulation with physical therapy and occupational therapy, patient is homeless.   3. Chronic thrombocytopenia. Plt stable at 69,   4. HTN. Persistent elevation in blood pressure, will add HCTZ for blood pressure control.   DVT prophylaxis: scd   Code Status:  full Family Communication: no family at the bedside  Disposition Plan/ discharge barriers: patient from home, barrier for dc acute bleed, need H&H monitoring, prbc transfusion and endoscopy.     Consultants:   GI   Procedures:   Endoscopy      Subjective: Patient is sp endoscopy with no significant abdominal pain, no nausea or vomiting, no melena or hematochezia.   Objective: Vitals:   07/03/19 0007 07/03/19 0407 07/03/19 0840 07/03/19 1131  BP: (!) 154/85 (!) 157/78 (!) 149/75 (!) 175/78  Pulse: 72 74 72 82  Resp: 17 18 18 10   Temp: 99.8 F (37.7 C) 98.8 F (37.1 C) 98.8 F (37.1 C) 98.9 F (37.2 C)  TempSrc: Oral Oral Oral Oral  SpO2: 96% 97% 98% 98%  Weight:    81.6 kg  Height:    5\' 4"  (1.626 m)    Intake/Output Summary (Last 24 hours) at 07/03/2019 1245 Last data filed at 07/03/2019 1117 Gross per 24 hour  Intake 350 ml  Output  1075 ml  Net -725 ml   Filed Weights   07/01/19 2211 07/03/19 1131  Weight: 81.6 kg 81.6 kg    Examination:   General: Not in pain or dyspnea, deconditioned  Neurology: Awake and alert, non focal  E ENT: mild pallor, no icterus, oral mucosa moist Cardiovascular: No JVD. S1-S2 present, rhythmic, no gallops, rubs, or murmurs. No lower extremity edema. Pulmonary: positive breath sounds bilaterally, adequate air  movement, no wheezing, rhonchi or rales. Gastrointestinal. Abdomen with no organomegaly, non tender, no rebound or guarding Skin. No rashes Musculoskeletal: no joint deformities     Data Reviewed: I have personally reviewed following labs and imaging studies  CBC: Recent Labs  Lab 07/01/19 2331 07/01/19 2351 07/02/19 1444 07/03/19 0537  WBC 4.3  --  5.4 4.7  NEUTROABS 2.3  --  3.3 2.8  HGB 6.8* 8.8* 8.8* 8.9*  HCT 24.1* 26.0* 29.2* 29.4*  MCV 71.9*  --  74.7* 74.1*  PLT 75*  --  69* 69*   Basic Metabolic Panel: Recent Labs  Lab 07/01/19 2331 07/01/19 2351 07/02/19 1356 07/03/19 0537  NA 136 137 134* 132*  K 3.8 3.8 4.2 3.4*  CL 102 101 99 95*  CO2 25  --  25 24  GLUCOSE 100* 93 93 113*  BUN <5* <3* 6 8  CREATININE 0.60* 1.00 0.73 0.64  CALCIUM 8.6*  --  8.3* 8.6*  MG  --   --  1.8  --   PHOS  --   --  3.4  --    GFR: Estimated Creatinine Clearance: 101.8 mL/min (by C-G formula based on SCr of 0.64 mg/dL). Liver Function Tests: Recent Labs  Lab 07/01/19 2331 07/02/19 1356  AST 118* 91*  ALT 32 29  ALKPHOS 141* 138*  BILITOT 0.7 1.6*  PROT 8.2* 8.0  ALBUMIN 3.7 3.5   Recent Labs  Lab 07/01/19 2331  LIPASE 50   Recent Labs  Lab 07/01/19 2331  AMMONIA 29   Coagulation Profile: Recent Labs  Lab 07/01/19 2331  INR 1.1   Cardiac Enzymes: No results for input(s): CKTOTAL, CKMB, CKMBINDEX, TROPONINI in the last 168 hours. BNP (last 3 results) No results for input(s): PROBNP in the last 8760 hours. HbA1C: No results for input(s): HGBA1C in the last 72 hours. CBG: No results for input(s): GLUCAP in the last 168 hours. Lipid Profile: No results for input(s): CHOL, HDL, LDLCALC, TRIG, CHOLHDL, LDLDIRECT in the last 72 hours. Thyroid Function Tests: No results for input(s): TSH, T4TOTAL, FREET4, T3FREE, THYROIDAB in the last 72 hours. Anemia Panel: No results for input(s): VITAMINB12, FOLATE, FERRITIN, TIBC, IRON, RETICCTPCT in the last 72  hours.    Radiology Studies: I have reviewed all of the imaging during this hospital visit personally     Scheduled Meds: . [MAR Hold] LORazepam  0-4 mg Intravenous Q4H   Followed by  . [MAR Hold] LORazepam  0-4 mg Intravenous Q8H  . [MAR Hold] pantoprazole  40 mg Intravenous Q12H  . [MAR Hold] sodium chloride flush  3 mL Intravenous Q12H  . [MAR Hold] thiamine  100 mg Oral Daily   Or  . [MAR Hold] thiamine  100 mg Intravenous Daily   Continuous Infusions: . sodium chloride    . [MAR Hold] cefTRIAXone (ROCEPHIN)  IV 2 g (07/02/19 2300)  . dextrose 5% lactated ringers 75 mL/hr at 07/02/19 2254  . lactated ringers 1,000 mL (07/03/19 1147)  . octreotide  (SANDOSTATIN)    IV infusion Stopped (07/02/19  1520)  . pantoprozole (PROTONIX) infusion Stopped (07/02/19 1520)     LOS: 1 day        Barry Barcenas Gerome Apley, MD

## 2019-07-03 NOTE — Transfer of Care (Signed)
Immediate Anesthesia Transfer of Care Note  Patient: Barry Moore  Procedure(s) Performed: ESOPHAGOGASTRODUODENOSCOPY (EGD) (N/A )  Patient Location: Endoscopy Unit  Anesthesia Type:MAC  Level of Consciousness: drowsy and patient cooperative  Airway & Oxygen Therapy: Patient Spontanous Breathing and Patient connected to nasal cannula oxygen  Post-op Assessment: Report given to RN and Post -op Vital signs reviewed and stable  Post vital signs: Reviewed and stable  Last Vitals:  Vitals Value Taken Time  BP 145/97 07/03/19 1244  Temp    Pulse 89 07/03/19 1245  Resp 20 07/03/19 1245  SpO2 100 % 07/03/19 1245  Vitals shown include unvalidated device data.  Last Pain:  Vitals:   07/03/19 1131  TempSrc: Oral  PainSc: 0-No pain         Complications: No apparent anesthesia complications

## 2019-07-03 NOTE — TOC Initial Note (Signed)
Transition of Care [facility]) - Initial/Assessment Note    Patient Details  Name: Barry Moore MRN: [id number] Date of Birth: 05-21-1965  Transition of Care [facility]) CM/SW Contact:    [name], LCSW Phone Number: 07/03/2019, 3:08 PM  Clinical Narrative:  Patient seen in response to Dr consult for SA, as well as high risk of readmission.  Barry Moore states the residence in which he was living through the housing authority was terminated due to failure to follow the rules.  He let some friends stay with him, and the rules say no to that.  That was about a week ago.  [place].  He panhandles to get money.  Admits to drinking daily, "about the same rate I always have," and does not believe alcohol is tied to either his loss of housing or his medical condition. Has no intention of quitting. Barry Moore also admits to not following [facility] with [facility], and does not give a reason for that.  But he does go on to say that he likely would not make another appointment if set up for him. He is in need of a tent and sleeping bag.  CSW will follow up to see about securing these items. TOC will continue to follow during the course of hospitalization.                 Expected Discharge Plan: Home/Self Care Barriers to Discharge: No Barriers Identified   Patient Goals and CMS Choice Patient states their goals for this hospitalization and ongoing recovery are:: "I need a tent and sleeping bag."      Expected Discharge Plan and Services Expected Discharge Plan: Home/Self Care In-house Referral: Clinical Social Work     Living arrangements for the past 2 months: Homeless                                      Prior Living Arrangements/Services Living arrangements for the past 2 months: Homeless Lives with:: Self Patient language and need for interpreter reviewed:: Yes Do you feel safe going back to the place where you live?: Yes      Need for Family  Participation in Patient Care: No (Comment) Care giver support system in place?: No (comment)   Criminal Activity/Legal Involvement Pertinent to Current Situation/Hospitalization: No - Comment as needed  Activities of Daily Living Home Assistive Devices/Equipment: None ADL Screening (condition at time of admission) Patient's cognitive ability adequate to safely complete daily activities?: Yes Is the patient deaf or have difficulty hearing?: No Does the patient have difficulty seeing, even when wearing glasses/contacts?: No Does the patient have difficulty concentrating, remembering, or making decisions?: No Patient able to express need for assistance with ADLs?: Yes Does the patient have difficulty dressing or bathing?: No Independently performs ADLs?: Yes (appropriate for developmental age) Does the patient have difficulty walking or climbing stairs?: No Weakness of Legs: None Weakness of Arms/Hands: None  Permission Sought/Granted                  Emotional Assessment Appearance:: Appears stated age Attitude/Demeanor/Rapport: Engaged Affect (typically observed): Appropriate Orientation: : Oriented to Self, Oriented to Place, Oriented to  Time, Oriented to Situation Alcohol / Substance Use: Alcohol Use, Illicit Drugs Psych Involvement: No (comment)  Admission diagnosis:  Acute upper GI bleeding [K92.2] Acute GI bleeding [K92.2] Upper GI bleed [K92.2] Patient  Active Problem List   Diagnosis Date Noted  . Acute upper GI bleeding 07/02/2019  . Duodenitis   . Acute upper GI bleed 01/01/2019  . Hypokalemia 01/01/2019  . Alcohol dependence ([place]) 01/01/2019  . Alcoholic liver disease ([place]) 01/01/2019  . Acute pancreatitis 12/13/2018  . Free intraperitoneal air 12/13/2018  . Homicidal ideation   . Gastrointestinal hemorrhage with melena 08/31/2018  . Colon ulcer   . Polyp of ascending colon   . Acute GI bleeding 08/05/2018  . Transaminitis 08/05/2018  . Abdominal pain  08/05/2018  . Nausea and vomiting 08/05/2018  . Substance induced mood disorder ([place]) 07/16/2017  . 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 Microcytic anemia 03/18/2017  . Acute blood loss anemia 02/21/2017  . Upper GI bleed 02/08/2017  . Alcohol withdrawal ([place]) 02/08/2017  . Cocaine abuse ([place]) 11/18/2016  . GI bleed 11/01/2016  . Alcohol abuse   . Major depressive disorder, recurrent severe without psychotic features ([place]) 07/06/2016  . 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 Seizure ([place]) 05/26/2016  . Tobacco abuse 05/26/2016  . Homeless 05/26/2016  . Essential hypertension 05/26/2016  . Alcohol dependence with alcohol-induced mood disorder ([place]) 02/14/2016  . Severe alcohol withdrawal without perceptual disturbances without complication ([place]) [id number]  . Alcoholic hepatitis without ascites 02/14/2016  . Thrombocytopenia ([place]) 02/14/2016   PCP:  Patient, No Pcp Per Pharmacy:   [place], [place] [address] [place] [address] Phone: [phone] Fax: [phone]  [place] ([place]), [place] - [address] [place] ([place]) [place] [address] Phone: [phone] Fax: [place], [place] - [name] [place] [address] Phone: [phone] Fax: [phone]     Social Determinants of Health (SDOH) Interventions    Readmission Risk Interventions Readmission Risk Prevention Plan 01/03/2019 09/01/2018 08/09/2018  Transportation Screening Complete Complete Complete  PCP or Specialist Appt within 5-7 Days - - Complete  Home Care Screening - - Complete  Medication Review (RN CM) - - Complete  Medication Review (RN Care Manager) Complete Complete -  PCP or Specialist appointment within 3-5 days of discharge Not Complete Complete -   PCP/Specialist Appt Not Complete comments Got first available appointments - -  [place] or Home Care Consult Not Complete Complete -  SW Recovery Care/Counseling Consult Not Complete Complete -  Palliative Care Screening Not Applicable Not Applicable -  [place] Not Applicable Not Applicable -  Some recent data might be hidden

## 2019-07-03 NOTE — Anesthesia Postprocedure Evaluation (Signed)
Anesthesia Post Note  Patient: Barry Moore  Procedure(s) Performed: ESOPHAGOGASTRODUODENOSCOPY (EGD) (N/A )     Patient location during evaluation: PACU Anesthesia Type: MAC Level of consciousness: awake and alert Pain management: pain level controlled Vital Signs Assessment: post-procedure vital signs reviewed and stable Respiratory status: spontaneous breathing and respiratory function stable Cardiovascular status: stable Postop Assessment: no apparent nausea or vomiting Anesthetic complications: no    Last Vitals:  Vitals:   07/03/19 1250 07/03/19 1305  BP: (!) 134/97 (!) 149/84  Pulse: 77 75  Resp: 16 17  Temp:  37.1 C  SpO2: 100% 100%    Last Pain:  Vitals:   07/03/19 1305  TempSrc: Oral  PainSc:                  Hevin Jeffcoat DANIEL

## 2019-07-03 NOTE — H&P (Signed)
Patient here for egd to evaluate ugi bleed. No change in H and P. No distress Heart RRR. Lungs clear Abdomen soft IMP: melena Plan EGD.

## 2019-07-03 NOTE — Op Note (Signed)
[facility] Patient Name: Barry Moore Procedure Date: 07/03/2019 MRN: [id number] Attending MD: Wonda Horner , MD Date of Birth: [birth year] CSN: [id number] Age: 54 Admit Type: Inpatient Procedure:                Upper GI endoscopy Indications:              Melena Providers:                Wonda Horner, MD, Carmie End, RN, Laverda Sorenson, Technician, Adair Laundry, CRNA Referring MD:              Medicines:                Propofol per Anesthesia Complications:            No immediate complications. Estimated Blood Loss:     Estimated blood loss: none. Procedure:                Pre-Anesthesia Assessment:                           - Prior to the procedure, a History and Physical                            was performed, and patient medications and                            allergies were reviewed. The patient's tolerance of                            previous anesthesia was also reviewed. The risks                            and benefits of the procedure and the sedation                            options and risks were discussed with the patient.                            All questions were answered, and informed consent                            was obtained. Prior Anticoagulants: The patient has                            taken no previous anticoagulant or antiplatelet                            agents. ASA Grade Assessment: II - A patient with                            mild systemic disease. After reviewing the risks  and benefits, the patient was deemed in                            satisfactory condition to undergo the procedure.                           After obtaining informed consent, the endoscope was                            passed under direct vision. Throughout the                            procedure, the patient's blood pressure, pulse, and                            oxygen saturations were  monitored continuously. The                            [id number]-[id number] Olympus gastroscope was                            introduced through the mouth, and advanced to the                            second part of duodenum. The upper GI endoscopy was                            accomplished without difficulty. The patient                            tolerated the procedure well. Scope In: Scope Out: Findings:      The examined esophagus was normal.      Patchy erythematous mucosa was found in the gastric antrum.      Erythematous mucosa was found in the duodenal bulb. Impression:               - Normal esophagus.                           - Erythematous mucosa in the antrum.                           - Erythematous duodenopathy.                           - No specimens collected. Moderate Sedation:      . Recommendation:           - Resume regular diet.                           - Continue present medications.                           - Avoid ETOH. Procedure Code(s):        --- Professional ---  [id number], Esophagogastroduodenoscopy, flexible,                            transoral; diagnostic, including collection of                            specimen(s) by brushing or washing, when performed                            (separate procedure) Diagnosis Code(s):        --- Professional ---                           K31.89, Other diseases of stomach and duodenum                           K92.1, Melena (includes Hematochezia) CPT copyright 2019 [employer] Association. All rights reserved. The codes documented in this report are preliminary and upon coder review may  be revised to meet current compliance requirements. Wonda Horner, MD 07/03/2019 12:40:17 PM This report has been signed electronically. Number of Addenda: 0

## 2019-07-03 NOTE — Plan of Care (Signed)
  Problem: Safety: Goal: Ability to remain free from injury will improve Outcome: Progressing   Problem: Education: Goal: Knowledge of General Education information will improve Description: Including pain rating scale, medication(s)/side effects and non-pharmacologic comfort measures Outcome: Progressing   Problem: Health Behavior/Discharge Planning: Goal: Ability to manage health-related needs will improve Outcome: Progressing   Problem: Clinical Measurements: Goal: Ability to maintain clinical measurements within normal limits will improve Outcome: Progressing Goal: Will remain free from infection Outcome: Progressing Goal: Diagnostic test results will improve Outcome: Progressing Goal: Respiratory complications will improve Outcome: Progressing Goal: Cardiovascular complication will be avoided Outcome: Progressing   Problem: Activity: Goal: Risk for activity intolerance will decrease Outcome: Progressing   Problem: Nutrition: Goal: Adequate nutrition will be maintained Outcome: Progressing   Problem: Coping: Goal: Level of anxiety will decrease Outcome: Progressing   Problem: Elimination: Goal: Will not experience complications related to bowel motility Outcome: Progressing Goal: Will not experience complications related to urinary retention Outcome: Progressing   Problem: Pain Managment: Goal: General experience of comfort will improve Outcome: Progressing   Problem: Safety: Goal: Ability to remain free from injury will improve Outcome: Progressing   Problem: Skin Integrity: Goal: Risk for impaired skin integrity will decrease Outcome: Progressing

## 2019-07-03 NOTE — Anesthesia Preprocedure Evaluation (Addendum)
Anesthesia Evaluation  Patient identified by MRN, date of birth, ID band Patient awake    Reviewed: Allergy & Precautions, H&P , NPO status , Patient's Chart, lab work & pertinent test results  History of Anesthesia Complications Negative for: history of anesthetic complications  Airway Mallampati: II  TM Distance: >3 FB Neck ROM: Full    Dental   Pulmonary Current Smoker,    Pulmonary exam normal        Cardiovascular hypertension, Normal cardiovascular exam     Neuro/Psych Seizures -,  PSYCHIATRIC DISORDERS Depression    GI/Hepatic PUD, (+)     substance abuse  alcohol use and cocaine use, Hepatitis -  Endo/Other    Renal/GU      Musculoskeletal   Abdominal   Peds  Hematology  (+) Blood dyscrasia, anemia ,   Anesthesia Other Findings   Reproductive/Obstetrics                           Anesthesia Physical  Anesthesia Plan  ASA: III  Anesthesia Plan: MAC   Post-op Pain Management:    Induction: Intravenous  PONV Risk Score and Plan: 0 and Propofol infusion and Treatment may vary due to age or medical condition  Airway Management Planned: Nasal Cannula, Natural Airway and Simple Face Mask  Additional Equipment:   Intra-op Plan:   Post-operative Plan:   Informed Consent: I have reviewed the patients History and Physical, chart, labs and discussed the procedure including the risks, benefits and alternatives for the proposed anesthesia with the patient or authorized representative who has indicated his/her understanding and acceptance.     Dental advisory given  Plan Discussed with: CRNA and Anesthesiologist  Anesthesia Plan Comments:        Anesthesia Quick Evaluation

## 2019-07-04 ENCOUNTER — Encounter: Payer: Self-pay | Admitting: *Deleted

## 2019-07-04 ENCOUNTER — Ambulatory Visit: Payer: Medicaid Other | Admitting: Family Medicine

## 2019-07-04 LAB — HEMOGLOBIN AND HEMATOCRIT, BLOOD
HCT: 30.7 % — ABNORMAL LOW (ref 39.0–52.0)
Hemoglobin: 9.4 g/dL — ABNORMAL LOW (ref 13.0–17.0)

## 2019-07-04 MED ORDER — PANTOPRAZOLE SODIUM 40 MG PO TBEC: 40.0000 mg | DELAYED_RELEASE_TABLET | Freq: Every day | ORAL | 0 refills | Status: DC

## 2019-07-04 NOTE — Discharge Summary (Signed)
Physician Discharge Summary  Barry Moore [id number] DOB: 03/15/1966 DOA: 07/01/2019  PCP: Patient, No Pcp Per  Admit date: 07/01/2019 Discharge date: 07/04/2019  Admitted From: Home  Disposition:   Home   Recommendations for Outpatient Follow-up and new medication changes:  1. Follow up with Primary Care in 7 days.  2. Patient placed on pantoprazole 40 mg day 3. Follow blood pressure as outpatient.   Home Health: no   Equipment/Devices: no    Discharge Condition: stable  CODE STATUS: full  Diet recommendation: regular.   Brief/Interim Summary: Patient admitted to the hospital with the working diagnosis of acute blood loss anemia due to upper GI bleed.  54 year old male who presented with hematemesis and melena. He does have significant past medical history for alcohol abuse, cirrhosis, chronic anemia and thrombocytopenia. He reported 24 hours of hematemesis and melena, no lightheadedness or syncope. No dyspnea. Ongoing alcohol abuse. On his initial physical examination blood pressure 130/63, heart rate 89, respiratory rate 18, temperature 98, oxygen saturation 97%, his lungs were clear to auscultation bilaterally, heart S1-S2 present rhythmic, abdomen was soft nontender,mild distention, possible ascites, no lower extremity edema. Sodium 136, potassium 3.8, chloride 102, bicarb 25, glucose 100, BUN 25, creatinine 0.6, lipase 50, AST 118, ALT 32, white count 4.3, hemoglobin 6.8, hematocrit 24.1, platelets 75.SARS COVID-19 negative. CT head, chest and abdomen negative for acute changes. EKG 86 bpm, normal axis, normal intervals, sinus rhythm, J-point elevation in V4 through V6, no T wave changes.  Patient had 2 units packed red blood cellstransfussed, started on intravenous proton pump inhibitor and octreotide. Prophylaxis antibiotic therapy withceftriaxone considering ascites.  Patient underwent EGD  noted erythematous mucosa in the antrum, and erythematous  duodenopathy, no esophageal varices.   1.  Acute blood loss anemia due to upper GI bleed.  Patient was admitted to the telemetry ward, he received intravenous pantoprazole, intravenous fluids, and as needed antiemetics.  He received 2 units packed red blood cells with good toleration.  Considering patient has cirrhosis, he received octreotide and ceftriaxone IV.   Patient underwent upper endoscopy which showed erythematous mucosa in the antrum and duodenum, no esophageal varices, octreotide and antibiotic therapy was discontinued.  Patient was transitioned to oral pantoprazole.  He was advised about alcohol and smoking cessation.  Follow-up as an outpatient.  His discharge hemoglobin is 9.4, hematocrit 30.7.  2.  Alcohol abuse.  Patient was placed on benzodiazepines per CIWA protocol with good toleration, he also received multivitamins including thiamine.  Patient able to ambulate independently, no signs of acute withdrawal while hospitalized.  3.  Chronic thrombocytopenia due to cirrhosis.  Platelet count remained stable, at discharge 69.  4.  Hypertension.  Patient had elevated blood pressure, 1 [id number] systolic while hospitalized, he did receive hydrochlorothiazide.  I will recommend follow-up blood pressure as an outpatient before continue antihypertensive agents.     Discharge Diagnoses:  Principal Problem:   Acute upper GI bleeding Active Problems:   Thrombocytopenia ([place])   Upper GI bleed   Microcytic anemia   Acute GI bleeding   Alcohol dependence ([place])    Discharge Instructions   Allergies as of 07/04/2019      Reactions   Tomato Shortness Of Breath, Nausea And Vomiting   Aspirin Nausea And Vomiting   Sulfa Antibiotics       Medication List    STOP taking these medications   folic acid 1 MG tablet Commonly known as: FOLVITE   multivitamin with minerals Tabs tablet  thiamine 100 MG tablet   venlafaxine XR 75 MG 24 hr capsule Commonly known as: Effexor XR      TAKE these medications   pantoprazole 40 MG tablet Commonly known as: PROTONIX Take 1 tablet (40 mg total) by mouth daily.       Allergies  Allergen Reactions  . Tomato Shortness Of Breath and Nausea And Vomiting  . Aspirin Nausea And Vomiting  . Sulfa Antibiotics     Consultations:  GI    Procedures/Studies: DG Lumbar Spine Complete  Result Date: 06/15/2019 CLINICAL DATA:  Fall, pain EXAM: LUMBAR SPINE - COMPLETE 4+ VIEW COMPARISON:  08/29/2018 FINDINGS: Disc space narrowing at L4-5 and L5-S1. Early anterior spurring. Normal alignment. No fracture. SI joints symmetric and unremarkable. IMPRESSION: No acute bony abnormality. 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 Electronically Signed   By: Rolm Baptise M.D.   On: 06/15/2019 03:40   CT HEAD WO CONTRAST  Result Date: 07/02/2019 CLINICAL DATA:  Altered mental status EXAM: CT HEAD WITHOUT CONTRAST TECHNIQUE: Contiguous axial images were obtained from the base of the skull through the vertex without intravenous contrast. COMPARISON:  06/21/2019 head CT FINDINGS: Brain: No acute territorial infarction, hemorrhage, or intracranial mass. Moderate atrophy somewhat advanced for age. Mild hypodensity in the white matter consistent with chronic small vessel ischemic change. Stable ventricle size Vascular: No hyperdense vessels.  Carotid vascular calcification Skull: Normal. Negative for fracture or focal lesion. Sinuses/Orbits: Mucosal thickening in the sinuses Other: None IMPRESSION: 1. No CT evidence for acute intracranial abnormality. 2. Advanced for age atrophy. Mild chronic small vessel ischemic change of the white matter Electronically Signed   By: Donavan Foil M.D.   On: 07/02/2019 01:32   CT Head Wo Contrast  Result Date: 06/21/2019 CLINICAL DATA:  Headache, possible seizure, intoxication EXAM: CT HEAD WITHOUT CONTRAST TECHNIQUE: Contiguous axial images were obtained from the base of the skull through the vertex  without intravenous contrast. COMPARISON:  12/13/2018 FINDINGS: Brain: No acute infarct or hemorrhage. Lateral ventricles and midline structures are unremarkable. No acute extra-axial fluid collections. No mass effect. Vascular: No hyperdense vessel or unexpected calcification. Skull: Normal. Negative for fracture or focal lesion. Sinuses/Orbits: No acute finding. Other: None IMPRESSION: 1. No acute intracranial process. Electronically Signed   By: Randa Ngo M.D.   On: 06/21/2019 18:07   CT CHEST W CONTRAST  Result Date: 07/02/2019 CLINICAL DATA:  Status post trauma. EXAM: CT CHEST, ABDOMEN, AND PELVIS WITH CONTRAST TECHNIQUE: Multidetector CT imaging of the chest, abdomen and pelvis was performed following the standard protocol during bolus administration of intravenous contrast. CONTRAST:  137mL OMNIPAQUE IOHEXOL 300 MG/ML  SOLN COMPARISON:  April 28, 2019 FINDINGS: CT CHEST FINDINGS Cardiovascular: No significant vascular findings. Normal heart size. No pericardial effusion. Moderate to marked severity coronary artery calcification is seen. Mediastinum/Nodes: No enlarged mediastinal, hilar, or axillary lymph nodes. Thyroid gland, trachea, and esophagus demonstrate no significant findings. Lungs/Pleura: Small patchy areas of low attenuation are seen within the anterolateral aspect of the right upper lobe. The largest area measures approximately 1.7 cm x 1.5 cm, while the adjacent areas measure less than 1 cm. There is no evidence of a pleural effusion or pneumothorax. Musculoskeletal: A chronic fracture deformity is seen involving the mid left clavicle. CT ABDOMEN PELVIS FINDINGS Hepatobiliary: No focal liver abnormality is seen. No gallstones, gallbladder wall thickening, or biliary dilatation. Pancreas: Unremarkable. No pancreatic ductal dilatation or surrounding inflammatory changes. Spleen: Normal in size without focal abnormality. Adrenals/Urinary  Tract: Adrenal glands are unremarkable. Kidneys are  normal, without renal calculi, focal lesion, or hydronephrosis. Bladder is unremarkable. Stomach/Bowel: Stomach is within normal limits. Appendix appears normal. No evidence of bowel wall thickening, distention, or inflammatory changes. Vascular/Lymphatic: Moderate severity aortic calcification. No enlarged abdominal or pelvic lymph nodes. Reproductive: Prostate is unremarkable. Other: Very mild nonspecific mesenteric inflammatory fat stranding is seen within the mid right abdomen. This is a adjacent to the anteromedial aspect of the right kidney and posterior to the hepatic flexure. This is seen on the prior study and is decreased in severity on the current exam. Musculoskeletal: No acute or significant osseous findings. IMPRESSION: 1. Small patchy areas of low attenuation within the anterolateral aspect of the right upper lobe, as described above. While these may represent areas of focal atelectasis, sequelae associated with pulmonary contusion cannot be excluded. 2. Very mild, chronic nonspecific mesenteric inflammatory fat stranding within the mid right abdomen. This is decreased in severity when compared to the prior study. 3. Moderate to marked severity coronary artery calcification. 4. Moderate severity aortic calcification. 5. Chronic fracture deformity involving the mid left clavicle. Aortic Atherosclerosis (ICD10-I70.0). Electronically Signed   By: Virgina Norfolk M.D.   On: 07/02/2019 01:42   CT Cervical Spine Wo Contrast  Result Date: 06/21/2019 CLINICAL DATA:  Seizure, fell, intoxicated EXAM: CT CERVICAL SPINE WITHOUT CONTRAST TECHNIQUE: Multidetector CT imaging of the cervical spine was performed without intravenous contrast. Multiplanar CT image reconstructions were also generated. COMPARISON:  12/13/2018 FINDINGS: Alignment: Alignment is grossly anatomic. Skull base and vertebrae: No acute displaced fractures. Soft tissues and spinal canal: No prevertebral fluid or swelling. No visible canal  hematoma. Disc levels: Minimal spondylosis at C4/C5 with anterior osteophyte formation. No significant bony encroachment upon the central canal or neural foramina. Upper chest: There is a nonunion of a prior left clavicular fracture. Airway is patent. Lung apices are clear. Other: Reconstructed images demonstrate no additional findings. IMPRESSION: 1. No acute cervical spine fracture. Electronically Signed   By: Randa Ngo M.D.   On: 06/21/2019 18:12   CT ABDOMEN PELVIS W CONTRAST  Result Date: 07/02/2019 CLINICAL DATA:  Status post trauma. EXAM: CT CHEST, ABDOMEN, AND PELVIS WITH CONTRAST TECHNIQUE: Multidetector CT imaging of the chest, abdomen and pelvis was performed following the standard protocol during bolus administration of intravenous contrast. CONTRAST:  168mL OMNIPAQUE IOHEXOL 300 MG/ML  SOLN COMPARISON:  April 28, 2019 FINDINGS: CT CHEST FINDINGS Cardiovascular: No significant vascular findings. Normal heart size. No pericardial effusion. Moderate to marked severity coronary artery calcification is seen. Mediastinum/Nodes: No enlarged mediastinal, hilar, or axillary lymph nodes. Thyroid gland, trachea, and esophagus demonstrate no significant findings. Lungs/Pleura: Small patchy areas of low attenuation are seen within the anterolateral aspect of the right upper lobe. The largest area measures approximately 1.7 cm x 1.5 cm, while the adjacent areas measure less than 1 cm. There is no evidence of a pleural effusion or pneumothorax. Musculoskeletal: A chronic fracture deformity is seen involving the mid left clavicle. CT ABDOMEN PELVIS FINDINGS Hepatobiliary: No focal liver abnormality is seen. No gallstones, gallbladder wall thickening, or biliary dilatation. Pancreas: Unremarkable. No pancreatic ductal dilatation or surrounding inflammatory changes. Spleen: Normal in size without focal abnormality. Adrenals/Urinary Tract: Adrenal glands are unremarkable. Kidneys are normal, without renal  calculi, focal lesion, or hydronephrosis. Bladder is unremarkable. Stomach/Bowel: Stomach is within normal limits. Appendix appears normal. No evidence of bowel wall thickening, distention, or inflammatory changes. Vascular/Lymphatic: Moderate severity aortic calcification. No enlarged abdominal or pelvic  lymph nodes. Reproductive: Prostate is unremarkable. Other: Very mild nonspecific mesenteric inflammatory fat stranding is seen within the mid right abdomen. This is a adjacent to the anteromedial aspect of the right kidney and posterior to the hepatic flexure. This is seen on the prior study and is decreased in severity on the current exam. Musculoskeletal: No acute or significant osseous findings. IMPRESSION: 1. Small patchy areas of low attenuation within the anterolateral aspect of the right upper lobe, as described above. While these may represent areas of focal atelectasis, sequelae associated with pulmonary contusion cannot be excluded. 2. Very mild, chronic nonspecific mesenteric inflammatory fat stranding within the mid right abdomen. This is decreased in severity when compared to the prior study. 3. Moderate to marked severity coronary artery calcification. 4. Moderate severity aortic calcification. 5. Chronic fracture deformity involving the mid left clavicle. Electronically Signed   By: Virgina Norfolk M.D.   On: 07/02/2019 01:43      Procedures: Upper EGD   Subjective: Patient is feeling better, no nausea or vomiting, no abdominal pain, no chest pain or dyspnea, tolerating po well.   Discharge Exam: Vitals:   07/03/19 2009 07/04/19 0422  BP: 135/80 139/88  Pulse: 97 81  Resp: 20 16  Temp: 99.8 F (37.7 C) 99 F (37.2 C)  SpO2: 98% 96%   Vitals:   07/03/19 1250 07/03/19 1305 07/03/19 2009 07/04/19 0422  BP: (!) 134/97 (!) 149/84 135/80 139/88  Pulse: 77 75 97 81  Resp: 16 17 20 16   Temp:  98.8 F (37.1 C) 99.8 F (37.7 C) 99 F (37.2 C)  TempSrc:  Oral Oral Oral  SpO2:  100% 100% 98% 96%  Weight:      Height:        General: Not in pain or dyspnea.  Neurology: Awake and alert, non focal  E ENT: no pallor, no icterus, oral mucosa moist Cardiovascular: No JVD. S1-S2 present, rhythmic, no gallops, rubs, or murmurs. No lower extremity edema. Pulmonary: vesicular breath sounds bilaterally, adequate air movement, no wheezing, rhonchi or rales. Gastrointestinal. Abdomen with no organomegaly, non tender, no rebound or guarding Skin. No rashes Musculoskeletal: no joint deformities   The results of significant diagnostics from this hospitalization (including imaging, microbiology, ancillary and laboratory) are listed below for reference.     Microbiology: Recent Results (from the past 240 hour(s))  Respiratory Panel by RT PCR (Flu A&B, Covid) - Nasopharyngeal Swab     Status: None   Collection Time: 07/02/19  5:15 AM   Specimen: Nasopharyngeal Swab  Result Value Ref Range Status   SARS Coronavirus 2 by RT PCR NEGATIVE NEGATIVE Final    Comment: (NOTE) SARS-CoV-2 target nucleic acids are NOT DETECTED. The SARS-CoV-2 RNA is generally detectable in upper respiratoy specimens during the acute phase of infection. The lowest concentration of SARS-CoV-2 viral copies this assay can detect is 131 copies/mL. A negative result does not preclude SARS-Cov-2 infection and should not be used as the sole basis for treatment or other patient management decisions. A negative result may occur with  improper specimen collection/handling, submission of specimen other than nasopharyngeal swab, presence of viral mutation(s) within the areas targeted by this assay, and inadequate number of viral copies (<131 copies/mL). A negative result must be combined with clinical observations, patient history, and epidemiological information. The expected result is Negative. Fact Sheet for Patients:  PinkCheek.be Fact Sheet for Healthcare Providers:   GravelBags.it This test is not yet ap proved or cleared by the [place]  FDA and  has been authorized for detection and/or diagnosis of SARS-CoV-2 by FDA under an Emergency Use Authorization (EUA). This EUA will remain  in effect (meaning this test can be used) for the duration of the COVID-19 declaration under Section 564(b)(1) of the Act, 21 U.S.C. section 360bbb-3(b)(1), unless the authorization is terminated or revoked sooner.    Influenza A by PCR NEGATIVE NEGATIVE Final   Influenza B by PCR NEGATIVE NEGATIVE Final    Comment: (NOTE) The Xpert Xpress SARS-CoV-2/FLU/RSV assay is intended as an aid in  the diagnosis of influenza from Nasopharyngeal swab specimens and  should not be used as a sole basis for treatment. Nasal washings and  aspirates are unacceptable for Xpert Xpress SARS-CoV-2/FLU/RSV  testing. Fact Sheet for Patients: PinkCheek.be Fact Sheet for Healthcare Providers: GravelBags.it This test is not yet approved or cleared by the [place] FDA and  has been authorized for detection and/or diagnosis of SARS-CoV-2 by  FDA under an Emergency Use Authorization (EUA). This EUA will remain  in effect (meaning this test can be used) for the duration of the  Covid-19 declaration under Section 564(b)(1) of the Act, 21  U.S.C. section 360bbb-3(b)(1), unless the authorization is  terminated or revoked. Performed at [facility], [place] [address], [place], [place] [address]      Labs: BNP (last 3 results) No results for input(s): BNP in the last 8760 hours. Basic Metabolic Panel: Recent Labs  Lab 07/01/19 2331 07/01/19 2351 07/02/19 1356 07/03/19 0537  NA 136 137 134* 132*  K 3.8 3.8 4.2 3.4*  CL 102 101 99 95*  CO2 25  --  25 24  GLUCOSE 100* 93 93 113*  BUN <5* <3* 6 8  CREATININE 0.60* 1.00 0.73 0.64  CALCIUM 8.6*  --  8.3* 8.6*  MG  --   --  1.8   --   PHOS  --   --  3.4  --    Liver Function Tests: Recent Labs  Lab 07/01/19 2331 07/02/19 1356  AST 118* 91*  ALT 32 29  ALKPHOS 141* 138*  BILITOT 0.7 1.6*  PROT 8.2* 8.0  ALBUMIN 3.7 3.5   Recent Labs  Lab 07/01/19 2331  LIPASE 50   Recent Labs  Lab 07/01/19 2331  AMMONIA 29   CBC: Recent Labs  Lab 07/01/19 2331 07/01/19 2351 07/02/19 1444 07/03/19 0537 07/04/19 0552  WBC 4.3  --  5.4 4.7  --   NEUTROABS 2.3  --  3.3 2.8  --   HGB 6.8* 8.8* 8.8* 8.9* 9.4*  HCT 24.1* 26.0* 29.2* 29.4* 30.7*  MCV 71.9*  --  74.7* 74.1*  --   PLT 75*  --  69* 69*  --    Cardiac Enzymes: No results for input(s): CKTOTAL, CKMB, CKMBINDEX, TROPONINI in the last 168 hours. BNP: Invalid input(s): POCBNP CBG: No results for input(s): GLUCAP in the last 168 hours. D-Dimer No results for input(s): DDIMER in the last 72 hours. Hgb A1c No results for input(s): HGBA1C in the last 72 hours. Lipid Profile No results for input(s): CHOL, HDL, LDLCALC, TRIG, CHOLHDL, LDLDIRECT in the last 72 hours. Thyroid function studies No results for input(s): TSH, T4TOTAL, T3FREE, THYROIDAB in the last 72 hours.  Invalid input(s): FREET3 Anemia work up No results for input(s): VITAMINB12, FOLATE, FERRITIN, TIBC, IRON, RETICCTPCT in the last 72 hours. Urinalysis    Component Value Date/Time   COLORURINE STRAW (A) 04/28/2019 [place] 04/28/2019 1623  LABSPEC 1.002 (L) 04/28/2019 1623   PHURINE 5.0 04/28/2019 1623   GLUCOSEU NEGATIVE 04/28/2019 1623   HGBUR SMALL (A) 04/28/2019 1623   BILIRUBINUR NEGATIVE 04/28/2019 1623   KETONESUR NEGATIVE 04/28/2019 1623   PROTEINUR NEGATIVE 04/28/2019 1623   NITRITE NEGATIVE 04/28/2019 1623   LEUKOCYTESUR NEGATIVE 04/28/2019 1623   Sepsis Labs Invalid input(s): PROCALCITONIN,  WBC,  LACTICIDVEN Microbiology Recent Results (from the past 240 hour(s))  Respiratory Panel by RT PCR (Flu A&B, Covid) - Nasopharyngeal Swab     Status:  None   Collection Time: 07/02/19  5:15 AM   Specimen: Nasopharyngeal Swab  Result Value Ref Range Status   SARS Coronavirus 2 by RT PCR NEGATIVE NEGATIVE Final    Comment: (NOTE) SARS-CoV-2 target nucleic acids are NOT DETECTED. The SARS-CoV-2 RNA is generally detectable in upper respiratoy specimens during the acute phase of infection. The lowest concentration of SARS-CoV-2 viral copies this assay can detect is 131 copies/mL. A negative result does not preclude SARS-Cov-2 infection and should not be used as the sole basis for treatment or other patient management decisions. A negative result may occur with  improper specimen collection/handling, submission of specimen other than nasopharyngeal swab, presence of viral mutation(s) within the areas targeted by this assay, and inadequate number of viral copies (<131 copies/mL). A negative result must be combined with clinical observations, patient history, and epidemiological information. The expected result is Negative. Fact Sheet for Patients:  PinkCheek.be Fact Sheet for Healthcare Providers:  GravelBags.it This test is not yet ap proved or cleared by the [place] FDA and  has been authorized for detection and/or diagnosis of SARS-CoV-2 by FDA under an Emergency Use Authorization (EUA). This EUA will remain  in effect (meaning this test can be used) for the duration of the COVID-19 declaration under Section 564(b)(1) of the Act, 21 U.S.C. section 360bbb-3(b)(1), unless the authorization is terminated or revoked sooner.    Influenza A by PCR NEGATIVE NEGATIVE Final   Influenza B by PCR NEGATIVE NEGATIVE Final    Comment: (NOTE) The Xpert Xpress SARS-CoV-2/FLU/RSV assay is intended as an aid in  the diagnosis of influenza from Nasopharyngeal swab specimens and  should not be used as a sole basis for treatment. Nasal washings and  aspirates are unacceptable for Xpert  Xpress SARS-CoV-2/FLU/RSV  testing. Fact Sheet for Patients: PinkCheek.be Fact Sheet for Healthcare Providers: GravelBags.it This test is not yet approved or cleared by the [place] FDA and  has been authorized for detection and/or diagnosis of SARS-CoV-2 by  FDA under an Emergency Use Authorization (EUA). This EUA will remain  in effect (meaning this test can be used) for the duration of the  Covid-19 declaration under Section 564(b)(1) of the Act, 21  U.S.C. section 360bbb-3(b)(1), unless the authorization is  terminated or revoked. Performed at [facility], [place] [address] [place], [place] [address]      Time coordinating discharge: 45 minutes  SIGNED:   Tawni Millers, MD  [facility] 07/04/2019, 8:55 AM

## 2019-07-04 NOTE — Progress Notes (Signed)
OT Cancellation Note  Patient Details Name: Barry Moore MRN: [id number] DOB: Aug 15, 1965   Cancelled Treatment:    Reason Eval/Treat Not Completed: OT screened, no needs identified, will sign off. Upon arrival patient seated by window, fully dressed and stating he is ready to leave. Explained role and asked patient if he has any concerns regarding self care, mobility patient politely declined. Patient at baseline. Will discontinue acute OT at this time.  Delbert Phenix OT Pager: [phone] 07/04/2019, 8:59 AM

## 2019-07-04 NOTE — Progress Notes (Signed)
PT Cancellation Note / Screen  Patient Details Name: Barry Moore MRN: [id number] DOB: 1966/01/12   Cancelled Treatment:    Reason Eval/Treat Not Completed: PT screened, no needs identified, will sign off Refer to OT note.  Pt with discharge summary as well.     Niema Carrara,KATHrine E 07/04/2019, 9:11 AM Jannette Spanner PT, DPT Acute Rehabilitation Services Office: [phone]

## 2019-07-05 ENCOUNTER — Other Ambulatory Visit: Payer: Self-pay

## 2019-07-05 ENCOUNTER — Emergency Department (HOSPITAL_COMMUNITY)
Admission: EM | Admit: 2019-07-05 | Discharge: 2019-07-06 | Disposition: A | Discharge status: Home / Self Care | Payer: Self-pay | Attending: Emergency Medicine | Admitting: Emergency Medicine

## 2019-07-05 DIAGNOSIS — R748 Abnormal levels of other serum enzymes: Secondary | ICD-10-CM

## 2019-07-05 DIAGNOSIS — Z5900 Homelessness unspecified: Secondary | ICD-10-CM

## 2019-07-05 DIAGNOSIS — F17210 Nicotine dependence, cigarettes, uncomplicated: Secondary | ICD-10-CM | POA: Insufficient documentation

## 2019-07-05 DIAGNOSIS — E876 Hypokalemia: Secondary | ICD-10-CM | POA: Insufficient documentation

## 2019-07-05 DIAGNOSIS — F10129 Alcohol abuse with intoxication, unspecified: Secondary | ICD-10-CM | POA: Insufficient documentation

## 2019-07-05 DIAGNOSIS — Y908 Blood alcohol level of 240 mg/100 ml or more: Secondary | ICD-10-CM | POA: Insufficient documentation

## 2019-07-05 DIAGNOSIS — F329 Major depressive disorder, single episode, unspecified: Secondary | ICD-10-CM | POA: Insufficient documentation

## 2019-07-05 DIAGNOSIS — R7401 Elevation of levels of liver transaminase levels: Secondary | ICD-10-CM

## 2019-07-05 DIAGNOSIS — I10 Essential (primary) hypertension: Secondary | ICD-10-CM | POA: Insufficient documentation

## 2019-07-05 DIAGNOSIS — R45851 Suicidal ideations: Secondary | ICD-10-CM | POA: Insufficient documentation

## 2019-07-05 DIAGNOSIS — D509 Iron deficiency anemia, unspecified: Secondary | ICD-10-CM | POA: Insufficient documentation

## 2019-07-05 DIAGNOSIS — R945 Abnormal results of liver function studies: Secondary | ICD-10-CM | POA: Insufficient documentation

## 2019-07-05 DIAGNOSIS — K7460 Unspecified cirrhosis of liver: Secondary | ICD-10-CM | POA: Insufficient documentation

## 2019-07-05 DIAGNOSIS — F1010 Alcohol abuse, uncomplicated: Secondary | ICD-10-CM | POA: Diagnosis present

## 2019-07-05 DIAGNOSIS — F10920 Alcohol use, unspecified with intoxication, uncomplicated: Secondary | ICD-10-CM

## 2019-07-05 DIAGNOSIS — D696 Thrombocytopenia, unspecified: Secondary | ICD-10-CM | POA: Insufficient documentation

## 2019-07-05 DIAGNOSIS — E871 Hypo-osmolality and hyponatremia: Secondary | ICD-10-CM | POA: Insufficient documentation

## 2019-07-05 NOTE — ED Provider Notes (Signed)
[facility] DEPT Provider Note   CSN: [id number] Arrival date & time: 07/05/19  2213   History Chief Complaint  Patient presents with  . Suicidal    Pt stated he is suicidal, this writer smell alcohol    Barry Moore is a 54 y.o. male.  The history is provided by medical records. The history is limited by the condition of the patient (Altered mental status).  He has history of hypertension, alcohol abuse, cirrhosis, GI bleeding and came in with suicidal ideation.  He reportedly told triage nurse that he wanted to kill himself.  However, when I went to see him, he was sleeping and could not be aroused.    Past Medical History:  Diagnosis Date  . Alcoholic hepatitis   . Depression   . ETOH abuse   . Gastric bleed 08/2018  . Hypertension   . Pancreatitis   . Seizures ([place])   . Suicidal behavior     Patient Active Problem List   Diagnosis Date Noted  . Acute upper GI bleeding 07/02/2019  . Duodenitis   . Acute upper GI bleed 01/01/2019  . Hypokalemia 01/01/2019  . Alcohol dependence ([place]) 01/01/2019  . Alcoholic liver disease ([place]) 01/01/2019  . Acute pancreatitis 12/13/2018  . Free intraperitoneal air 12/13/2018  . Homicidal ideation   . Gastrointestinal hemorrhage with melena 08/31/2018  . Colon ulcer   . Polyp of ascending colon   . Acute GI bleeding 08/05/2018  . Transaminitis 08/05/2018  . Abdominal pain 08/05/2018  . Nausea and vomiting 08/05/2018  . Substance induced mood disorder ([place]) 07/16/2017  . Severe recurrent major depression without psychotic features ([place]) 05/15/2017  . Microcytic anemia 03/18/2017  . Acute blood loss anemia 02/21/2017  . Upper GI bleed 02/08/2017  . Alcohol withdrawal ([place]) 02/08/2017  . Cocaine abuse ([place]) 11/18/2016  . GI bleed 11/01/2016  . Alcohol abuse   . Major depressive disorder, recurrent severe without psychotic features ([place]) 07/06/2016  . 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 Seizure ([place]) 05/26/2016  . Tobacco abuse 05/26/2016  . Homeless 05/26/2016  . Essential hypertension 05/26/2016  . Alcohol dependence with alcohol-induced mood disorder ([place]) 02/14/2016  . Severe alcohol withdrawal without perceptual disturbances without complication ([place]) [id number]  . Alcoholic hepatitis without ascites 02/14/2016  . Thrombocytopenia ([place]) 02/14/2016    Past Surgical History:  Procedure Laterality Date  . BIOPSY  08/05/2018   Procedure: BIOPSY;  Surgeon: Rush Landmark Telford Nab., MD;  Location: [facility];  Service: Gastroenterology;;  . BIOPSY  08/07/2018   Procedure: BIOPSY;  Surgeon: Thornton Park, MD;  Location: [facility] ENDOSCOPY;  Service: Gastroenterology;;  . BIOPSY  01/02/2019   Procedure: BIOPSY;  Surgeon: Gatha Mayer, MD;  Location: [facility] ENDOSCOPY;  Service: Endoscopy;;  . COLONOSCOPY WITH PROPOFOL N/A 08/07/2018   Procedure: COLONOSCOPY WITH PROPOFOL;  Surgeon: Thornton Park, MD;  Location: [facility];  Service: Gastroenterology;  Laterality: N/A;  . ESOPHAGOGASTRODUODENOSCOPY N/A 01/02/2019   Procedure: ESOPHAGOGASTRODUODENOSCOPY (EGD);  Surgeon: Gatha Mayer, MD;  Location: [facility];  Service: Endoscopy;  Laterality: N/A;  . ESOPHAGOGASTRODUODENOSCOPY N/A 07/03/2019   Procedure: ESOPHAGOGASTRODUODENOSCOPY (EGD);  Surgeon: Wonda Horner, MD;  Location: [facility]OSCOPY;  Service: Endoscopy;  Laterality: N/A;  . ESOPHAGOGASTRODUODENOSCOPY (EGD) WITH PROPOFOL N/A 11/02/2016   Procedure: ESOPHAGOGASTRODUODENOSCOPY (EGD) WITH PROPOFOL;  Surgeon: Carol Ada, MD;  [facility] ENDOSCOPY;  Service: Endoscopy;  Laterality: N/A;  . ESOPHAGOGASTRODUODENOSCOPY (EGD) WITH PROPOFOL N/A 08/05/2018   Procedure: ESOPHAGOGASTRODUODENOSCOPY (EGD) WITH PROPOFOL;  Surgeon: Irving Copas., MD;  Location: [facility];  Service: Gastroenterology;  Laterality: N/A;  . HERNIA REPAIR    . LEG SURGERY    . POLYPECTOMY  08/07/2018   Procedure: POLYPECTOMY;   Surgeon: Thornton Park, MD;  Location: [facility] ENDOSCOPY;  Service: Gastroenterology;;       Family History  Problem Relation Age of Onset  . Diabetes Mother   . Alcoholism Mother   . Emphysema Father   . Lung cancer Father   . Alcoholism Father     Social History   Tobacco Use  . Smoking status: Current Every Day Smoker    Packs/day: 1.00    Types: Cigarettes  . Smokeless tobacco: Never Used  Substance Use Topics  . Alcohol use: Yes    Comment: 5+ BEERS DAILY  . Drug use: Not Currently    Frequency: 3.0 times per week    Types: Cocaine    Home Medications Prior to Admission medications   Medication Sig Start Date End Date Taking? Authorizing Provider  pantoprazole (PROTONIX) 40 MG tablet Take 1 tablet (40 mg total) by mouth daily. 07/04/19 08/03/19  Arrien, Jimmy Picket, MD  ferrous sulfate 325 (65 FE) MG tablet Take 1 tablet (325 mg total) by mouth daily with breakfast. Patient not taking: Reported on 09/23/2018 08/09/18 10/09/18  Elodia Florence., MD    Allergies    Tomato, Aspirin, and Sulfa antibiotics  Review of Systems   Review of Systems  Unable to perform ROS: Mental status change    Physical Exam Updated Vital Signs BP 109/78 (BP Location: Left Arm)   Pulse 83   Temp 98.1 F (36.7 C) (Oral)   Resp 18   Ht 5\' 4"  (1.626 m)   Wt 81.6 kg   SpO2 100%   BMI 30.88 kg/m   Physical Exam Vitals and nursing note reviewed.   54 year old male, resting comfortably and in no acute distress. Vital signs are normal. Oxygen saturation is 100%, which is normal.  There is a strong odor of ethanol on his breath. Head is normocephalic and atraumatic.  Pupils 3 mm and reactive. Neck is nontender and supple without adenopathy or JVD. Back is nontender and there is no CVA tenderness. Lungs are clear without rales, wheezes, or rhonchi. Chest is nontender. Heart has regular rate and rhythm without murmur. Abdomen is soft, flat, nontender without masses or  hepatosplenomegaly and peristalsis is normoactive. Extremities have no cyanosis or edema, full range of motion is present. Skin is warm and dry without rash. Neurologic: Sleeping and nonresponsive to painful stimuli, cranial nerves are grossly intact.  ED Results / Procedures / Treatments   Labs (all labs ordered are listed, but only abnormal results are displayed) Labs Reviewed  COMPREHENSIVE METABOLIC PANEL - Abnormal; Notable for the following components:      Result Value   Sodium 128 (*)    Potassium 3.3 (*)    Chloride 96 (*)    CO2 20 (*)    BUN 5 (*)    Total Protein 8.4 (*)    AST 75 (*)    Alkaline Phosphatase 156 (*)    All other components within normal limits  ETHANOL - Abnormal; Notable for the following components:   Alcohol, Ethyl (B) 275 (*)    All other components within normal limits  SALICYLATE LEVEL - Abnormal; Notable for the following components:   Salicylate Lvl [id number] (*)    All other components within normal limits  ACETAMINOPHEN  LEVEL - Abnormal; Notable for the following components:   Acetaminophen (Tylenol), Serum <10 (*)    All other components within normal limits  CBC WITH DIFFERENTIAL/PLATELET - Abnormal; Notable for the following components:   RBC 3.91 (*)    Hemoglobin 8.9 (*)    HCT 29.3 (*)    MCV 74.9 (*)    MCH 22.8 (*)    RDW 23.9 (*)    Platelets 118 (*)    All other components within normal limits  RAPID URINE DRUG SCREEN, HOSP PERFORMED   Procedures Procedures   Medications Ordered in ED Medications  nicotine (NICODERM CQ - dosed in mg/24 hours) patch 14 mg (has no administration in time range)  alum & mag hydroxide-simeth (MAALOX/MYLANTA) 200-200-20 MG/5ML suspension 30 mL (has no administration in time range)  ondansetron (ZOFRAN) tablet 4 mg (has no administration in time range)  acetaminophen (TYLENOL) tablet 650 mg (has no administration in time range)  potassium chloride SA (KLOR-CON) CR tablet 40 mEq (has no  administration in time range)    ED Course  I have reviewed the triage vital signs and the nursing notes.  Pertinent labs & imaging results that were available during my care of the patient were reviewed by me and considered in my medical decision making (see chart for details).  Suicidal ideation.  Altered mental status likely secondary to alcohol intoxication.  No evidence of trauma on exam, but will also check urine drug screen.  Old records are reviewed, and he had been discharged yesterday following an upper GI bleed.  Upper endoscopy showed erythema in the stomach and duodenum without active bleeding.  Will check screening labs including hemoglobin and alcohol levels.  He will need to be evaluated for suicidal ideation once he has metabolized enough alcohol to be able to conduct an interview.  He has numerous ED visits and hospital admissions related to alcohol abuse and GI bleeding.  Hemoglobin is 8.9, not significantly changed from recent values.  He has mild hyponatremia which is not felt to be clinically significant.  There is mild hypokalemia and is given a dose of oral potassium.  Mild elevation of AST, not significantly different from prior.  Mild elevation of alkaline phosphatase is also not significantly changed.  Thrombocytopenia is present actually improved over baseline.  Ethanol level is 275, consistent with his degree of intoxication seen at admission.  Will request TTS consultation.  TTS consultation is appreciated.  Patient meets inpatient criteria.  Currently, no beds available at [facility], TTS is working on appropriate placement for psychiatric care.  MDM Rules/Calculators/A&P  Final Clinical Impression(s) / ED Diagnoses Final diagnoses:  Suicidal ideation  Alcohol intoxication, uncomplicated (HCC)  Microcytic anemia  Hyponatremia  Hypokalemia  High serum aspartate aminotransferase (AST) level  Elevated alkaline phosphatase level    Thrombocytopenia ([place])    Rx / DC Orders ED Discharge Orders    None       Delora Fuel, [place] 123456 [phone]

## 2019-07-06 ENCOUNTER — Encounter (HOSPITAL_COMMUNITY): Payer: Self-pay | Admitting: Emergency Medicine

## 2019-07-06 LAB — COMPREHENSIVE METABOLIC PANEL WITH GFR
ALT: 28 U/L (ref 0–44)
AST: 75 U/L — ABNORMAL HIGH (ref 15–41)
Albumin: 3.8 g/dL (ref 3.5–5.0)
Alkaline Phosphatase: 156 U/L — ABNORMAL HIGH (ref 38–126)
Anion gap: 12 (ref 5–15)
BUN: 5 mg/dL — ABNORMAL LOW (ref 6–20)
CO2: 20 mmol/L — ABNORMAL LOW (ref 22–32)
Calcium: 9.1 mg/dL (ref 8.9–10.3)
Chloride: 96 mmol/L — ABNORMAL LOW (ref 98–111)
Creatinine, Ser: 0.68 mg/dL (ref 0.61–1.24)
GFR calc Af Amer: >60 mL/min
GFR calc non Af Amer: >60 mL/min
Glucose, Bld: 93 mg/dL (ref 70–99)
Potassium: 3.3 mmol/L — ABNORMAL LOW (ref 3.5–5.1)
Sodium: 128 mmol/L — ABNORMAL LOW (ref 135–145)
Total Bilirubin: 1.2 mg/dL (ref 0.3–1.2)
Total Protein: 8.4 g/dL — ABNORMAL HIGH (ref 6.5–8.1)

## 2019-07-06 LAB — CBC WITH DIFFERENTIAL/PLATELET
Abs Immature Granulocytes: 0.02 10*3/uL (ref 0.00–0.07)
Basophils Absolute: 0 10*3/uL (ref 0.0–0.1)
Basophils Relative: 1 %
Eosinophils Absolute: 0.1 10*3/uL (ref 0.0–0.5)
Eosinophils Relative: 2 %
HCT: 29.3 % — ABNORMAL LOW (ref 39.0–52.0)
Hemoglobin: 8.9 g/dL — ABNORMAL LOW (ref 13.0–17.0)
Immature Granulocytes: 0 %
Lymphocytes Relative: 32 %
Lymphs Abs: 1.6 10*3/uL (ref 0.7–4.0)
MCH: 22.8 pg — ABNORMAL LOW (ref 26.0–34.0)
MCHC: 30.4 g/dL (ref 30.0–36.0)
MCV: 74.9 fL — ABNORMAL LOW (ref 80.0–100.0)
Monocytes Absolute: 0.8 10*3/uL (ref 0.1–1.0)
Monocytes Relative: 16 %
Neutro Abs: 2.5 10*3/uL (ref 1.7–7.7)
Neutrophils Relative %: 49 %
Platelets: 118 10*3/uL — ABNORMAL LOW (ref 150–400)
RBC: 3.91 MIL/uL — ABNORMAL LOW (ref 4.22–5.81)
RDW: 23.9 % — ABNORMAL HIGH (ref 11.5–15.5)
WBC: 5.1 10*3/uL (ref 4.0–10.5)
nRBC: 0 % (ref 0.0–0.2)

## 2019-07-06 LAB — RAPID URINE DRUG SCREEN, HOSP PERFORMED
Amphetamines: NOT DETECTED
Barbiturates: NOT DETECTED
Benzodiazepines: NOT DETECTED
Cocaine: NOT DETECTED
Opiates: NOT DETECTED
Tetrahydrocannabinol: NOT DETECTED

## 2019-07-06 LAB — ACETAMINOPHEN LEVEL: Acetaminophen (Tylenol), Serum: <10 ug/mL — ABNORMAL LOW (ref 10–30)

## 2019-07-06 LAB — ETHANOL: Alcohol, Ethyl (B): 275 mg/dL — ABNORMAL HIGH (ref <10)

## 2019-07-06 LAB — SALICYLATE LEVEL: Salicylate Lvl: <7 mg/dL — ABNORMAL LOW (ref 7.0–30.0)

## 2019-07-06 MED ORDER — NICOTINE 14 MG/24HR TD PT24
14.0000 mg | MEDICATED_PATCH | Freq: Every day | TRANSDERMAL | Status: DC
  Administered 2019-07-06: 14 mg via TRANSDERMAL
  Filled 2019-07-06: qty 1

## 2019-07-06 MED ORDER — ALUM & MAG HYDROXIDE-SIMETH 200-200-20 MG/5ML PO SUSP: 30.0000 mL | Freq: Four times a day (QID) | ORAL | Status: DC | PRN

## 2019-07-06 MED ORDER — ACETAMINOPHEN 325 MG PO TABS: 650.0000 mg | ORAL_TABLET | ORAL | Status: DC | PRN

## 2019-07-06 MED ORDER — ONDANSETRON HCL 4 MG PO TABS: 4.0000 mg | ORAL_TABLET | Freq: Three times a day (TID) | ORAL | Status: DC | PRN

## 2019-07-06 MED ORDER — POTASSIUM CHLORIDE CRYS ER 20 MEQ PO TBCR: 40.0000 meq | EXTENDED_RELEASE_TABLET | Freq: Once | ORAL | Status: DC

## 2019-07-06 MED ORDER — POTASSIUM CHLORIDE CRYS ER 20 MEQ PO TBCR
40.0000 meq | EXTENDED_RELEASE_TABLET | Freq: Once | ORAL | Status: AC
  Administered 2019-07-06: 40 meq via ORAL
  Filled 2019-07-06: qty 2

## 2019-07-06 NOTE — BH Assessment (Signed)
Clinician attempted to make contact w/ pt's nurse at 0444 in regards to completing pt's Real Assessment, as pt's nurse noted that pt has been unresponsive. Pt's sitter noted that pt is still asleep and that pt's nurse is on break at this time and for clinician to attempt to make contact again in 10 - 15 minutes.  Clinician attempted to make contact w/ pt's nurse at [phone] and spoke to [place], [place]. Lanny Hurst stated pt's nurse is still on break and to attempt to make contact in another 10 - 15 minutes.  Clinician called back at 0534 and pt's nurse, Eugenie Norrie, stated she would check to see if pt was able and willing to participate in his [facility] Assessment. Pt's nurse returned to the phone and stated pt was alert and ready to participate. Clinician stated she would call the Tele-Assessment machine in 5 - 10 minutes to complete the assessment.

## 2019-07-06 NOTE — Progress Notes (Signed)
TOC CM contacted [facility]. Spoke to Coordinator and they have no available shelter beds. Will provide pt with contact number to have his name added to list and they will give him a call when bed becomes available. [place], [place] ED TOC CM [phone]
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
## 2019-07-06 NOTE — Discharge Instructions (Signed)
To help you maintain a sober lifestyle, a substance abuse treatment program may be beneficial to you.  Contact one of the following facilities at your earliest opportunity to ask about enrolling:  RESIDENTIAL PROGRAMS:       [facility]      [address]      [place], [place] [address]      [phone]       [facility]      [address]      [place], [place] [address]      [phone]       [facility]      [address]      [place], [place] [address]      [phone]  OUTPATIENT PROGRAMS:       [facility]      [address]      [place], [place] [address]      [phone]       New patients are seen at their walk-in clinic.  Walk-in hours are Monday - Friday from 8:30 am - 12:00 pm, and from 1:00 pm - 2:30 pm.  Walk-in patients are seen on a first come, first served basis, so try to arrive as early as possible for the best chance of being seen the same day. 

## 2019-07-06 NOTE — ED Triage Notes (Signed)
Received Barry Moore via ambulance. His initially compliant is suicidal. He was oriented to his new environment, gave a urine specimen and blood work was completed. He has a snack of his choice and drifted off to sleep. This writer could not wake him to complete the triage assessment. He continued to sleep throughout the night with intervals of talking in his sleep.

## 2019-07-06 NOTE — ED Notes (Signed)
Pt DCd off unit to home per provider. Pt alert, calm, cooperative, no s/s of distress.  DC information and resources given to pt. Pt taken out to front lobby per Stacey(charge)  RN has left a message to SW to have them call, to notify pt in front.

## 2019-07-06 NOTE — Consult Note (Signed)
[facility] Psych ED Discharge  07/06/2019 10:08 AM Barry Moore  MRN:  [id number] Principal Problem: Homeless Discharge Diagnoses: Principal Problem:   Homeless Active Problems:   Alcohol abuse   Subjective: "My male friend got me thrown out of my place because she was loud and slamming doors."  Patient assessed by nurse practitioner along with Dr. Dwyane Dee.  Patient alert and oriented, answers appropriately.  Patient endorses chronic suicidal ideations, denies plan, denies intent.  Patient denies homicidal ideations.  Patient denies auditory and visual hallucinations.  Patient denies symptoms of paranoia.  Patient does not appear to be responding to internal stimuli. Patient reports being homeless, after recently losing housing.  Patient states "if I had somewhere to live and I would not think about killing myself."  Patient reports alcohol use, approximately 6-40 ounce beers daily.  Patient states "I need to stop using alcohol."  Patient denies substance use.  Patient denies access to weapons.    Total Time spent with patient: 30 minutes  Past Psychiatric History: Alcohol abuse, alcohol dependence with alcohol-induced mood disorder, MDD, substance induced mood disorder  Past Medical History:  Past Medical History:  Diagnosis Date  . Alcoholic hepatitis   . Depression   . ETOH abuse   . Gastric bleed 08/2018  . Hypertension   . Pancreatitis   . Seizures ([place])   . Suicidal behavior     Past Surgical History:  Procedure Laterality Date  . BIOPSY  08/05/2018   Procedure: BIOPSY;  Surgeon: Rush Landmark Telford Nab., MD;  Location: [place];  Service: Gastroenterology;;  . BIOPSY  08/07/2018   Procedure: BIOPSY;  Surgeon: Thornton Park, MD;  Location: [facility] ENDOSCOPY;  Service: Gastroenterology;;  . BIOPSY  01/02/2019   Procedure: BIOPSY;  Surgeon: Gatha Mayer, MD;  Location: [facility] ENDOSCOPY;  Service: Endoscopy;;  . COLONOSCOPY WITH PROPOFOL N/A 08/07/2018   Procedure: COLONOSCOPY  WITH PROPOFOL;  Surgeon: Thornton Park, MD;  Location: [place];  Service: Gastroenterology;  Laterality: N/A;  . ESOPHAGOGASTRODUODENOSCOPY N/A 01/02/2019   Procedure: ESOPHAGOGASTRODUODENOSCOPY (EGD);  Surgeon: Gatha Mayer, MD;  Location: [facility];  Service: Endoscopy;  Laterality: N/A;  . ESOPHAGOGASTRODUODENOSCOPY N/A 07/03/2019   Procedure: ESOPHAGOGASTRODUODENOSCOPY (EGD);  Surgeon: Wonda Horner, MD;  Location: [facility];  Service: Endoscopy;  Laterality: N/A;  . ESOPHAGOGASTRODUODENOSCOPY (EGD) WITH PROPOFOL N/A 11/02/2016   Procedure: ESOPHAGOGASTRODUODENOSCOPY (EGD) WITH PROPOFOL;  Surgeon: Carol Ada, MD;  Location: [facility]OSCOPY;  Service: Endoscopy;  Laterality: N/A;  . ESOPHAGOGASTRODUODENOSCOPY (EGD) WITH PROPOFOL N/A 08/05/2018   Procedure: ESOPHAGOGASTRODUODENOSCOPY (EGD) WITH PROPOFOL;  Surgeon: Rush Landmark Telford Nab., MD;  Location: [place];  Service: Gastroenterology;  Laterality: N/A;  . HERNIA REPAIR    . LEG SURGERY    . POLYPECTOMY  08/07/2018   Procedure: POLYPECTOMY;  Surgeon: Thornton Park, MD;  Location[facility]COPY;  Service: Gastroenterology;;   Family History:  Family History  Problem Relation Age of Onset  . Diabetes Mother   . Alcoholism Mother   . Emphysema Father   . Lung cancer Father   . Alcoholism Father    Family Psychiatric  History: Biological mother-alcohol use disorder, biological father- alcohol use disorder Social History:  Social History   Substance and Sexual Activity  Alcohol Use Yes   Comment: 5+ BEERS DAILY     Social History   Substance and Sexual Activity  Drug Use Not Currently  . Frequency: 3.0 times per week  . Types: Cocaine    Social History   Socioeconomic History  .  Marital status: Divorced    Spouse name: Not on file  . Number of children: Not on file  . Years of education: Not on file  . Highest education level: Not on file  Occupational History  . Not on file  Tobacco Use  .  Smoking status: Current Every Day Smoker    Packs/day: 1.00    Types: Cigarettes  . Smokeless tobacco: Never Used  Substance and Sexual Activity  . Alcohol use: Yes    Comment: 5+ BEERS DAILY  . Drug use: Not Currently    Frequency: 3.0 times per week    Types: Cocaine  . Sexual activity: Not Currently  Other Topics Concern  . Not on file  Social History Narrative  . Not on file   Social Determinants of Health   Financial Resource Strain:   . Difficulty of Paying Living Expenses:   Food Insecurity:   . Worried About Charity fundraiser in the Last Year:   . Arboriculturist in the Last Year:   Transportation Needs:   . Film/video editor (Medical):   [name] Lack of Transportation (Non-Medical):   Physical Activity:   . Days of Exercise per Week:   . Minutes of Exercise per Session:   Stress:   . Feeling of Stress :   Social Connections:   . Frequency of Communication with Friends and Family:   . Frequency of Social Gatherings with Friends and Family:   . Attends Religious Services:   . Active Member of Clubs or Organizations:   . Attends Archivist Meetings:   [name] Marital Status:     Has this patient used any form of tobacco in the last 30 days? (Cigarettes, Smokeless Tobacco, Cigars, and/or Pipes) A prescription for an FDA-approved tobacco cessation medication was offered at discharge and the patient refused  Current Medications: Current Facility-Administered Medications  Medication Dose Route Frequency Provider Last Rate Last Admin  . acetaminophen (TYLENOL) tablet 650 mg  650 mg Oral [id number] PRN Delora Fuel, MD      . alum & mag hydroxide-simeth (MAALOX/MYLANTA) 200-200-20 MG/5ML suspension 30 mL  30 mL Oral [id number] PRN Delora Fuel, MD      . nicotine (NICODERM CQ - dosed in mg/24 hours) patch 14 mg  14 mg Transdermal Daily Delora Fuel, MD      . ondansetron [facility]) tablet 4 mg  4 mg Oral [id number] PRN Delora Fuel, MD      . potassium chloride SA (KLOR-CON) CR tablet  40 mEq  40 mEq Oral Once Delora Fuel, MD       Current Outpatient Medications  Medication Sig Dispense Refill  . pantoprazole (PROTONIX) 40 MG tablet Take 1 tablet (40 mg total) by mouth daily. 30 tablet 0   PTA Medications: (Not in a hospital admission)   Musculoskeletal: Strength & Muscle Tone: within normal limits Gait & Station: normal Patient leans: N/A  Psychiatric Specialty Exam: Physical Exam Vitals and nursing note reviewed.  Constitutional:      Appearance: He is well-developed.  HENT:     Head: Normocephalic.  Cardiovascular:     Rate and Rhythm: Normal rate.  Pulmonary:     Effort: Pulmonary effort is normal.  Musculoskeletal:        General: Normal range of motion.  Neurological:     Mental Status: He is alert and oriented to person, place, and time.  Psychiatric:        Mood and Affect: Mood normal.  Behavior: Behavior normal.        Thought Content: Thought content includes suicidal ideation.        Judgment: Judgment normal.     Review of Systems  Constitutional: Negative.   HENT: Negative.   Eyes: Negative.   Respiratory: Negative.   Cardiovascular: Negative.   Gastrointestinal: Negative.   Genitourinary: Negative.   Musculoskeletal: Negative.   Skin: Negative.   Neurological: Negative.   Psychiatric/Behavioral: Positive for suicidal ideas.    Blood pressure 127/74, pulse 92, temperature 98.7 F (37.1 C), temperature source Oral, resp. rate 16, height 5\' 4"  (1.626 m), weight 81.6 kg, SpO2 94 %.Body mass index is 30.88 kg/m.  General Appearance: Casual  Eye Contact:  Good  Speech:  Clear and Coherent and Normal Rate  Volume:  Normal  Mood:  Depressed  Affect:  Appropriate and Congruent  Thought Process:  Coherent, Goal Directed and Descriptions of Associations: Intact  Orientation:  Full (Time, Place, and Person)  Thought Content:  Logical  Suicidal Thoughts:  Yes.  without intent/plan  Homicidal Thoughts:  No  Memory:  Immediate;    Good Recent;   Good Remote;   Good  Judgement:  Good  Insight:  Fair  Psychomotor Activity:  Normal  Concentration:  Concentration: Good and Attention Span: Good  Recall:  Good  Fund of Knowledge:  Good  Language:  Good  Akathisia:  No  Handed:  Right  AIMS (if indicated):     Assets:  Communication Skills Desire for Improvement Financial Resources/Insurance Intimacy Leisure Time Physical Health Resilience Social Support  ADL's:  Intact  Cognition:  WNL  Sleep:        Demographic Factors:  Male and Caucasian  Loss Factors: NA  Historical Factors: NA  Risk Reduction Factors:   Positive social support, Positive therapeutic relationship and Positive coping skills or problem solving skills  Continued Clinical Symptoms:  Alcohol/Substance Abuse/Dependencies  Cognitive Features That Contribute To Risk:  None    Suicide Risk:  Minimal: No identifiable suicidal ideation.  Patients presenting with no risk factors but with morbid ruminations; may be classified as minimal risk based on the severity of the depressive symptoms    Plan Of Care/Follow-up recommendations:  Other:  Patient discussed with Dr Dwyane Dee  Social work consult ordered regarding homeless status. Peers support consult ordered regarding substance use disorder. Patient to follow-up with outpatient psychiatry at [facility].  Disposition: Discharge Emmaline Kluver, FNP 07/06/2019, 10:08 AM

## 2019-07-06 NOTE — BH Assessment (Addendum)
[place] Assessment Progress Note  Per [name], FNP, this pt does not require psychiatric hospitalization at this time.  Pt is to be discharged from [facility] with substance abuse treatment referral information.  This has been included in pt's discharge instructions.  Pt would also benefit from seeing Peer Support Specialists, and a peer support consult has been ordered for pt, along with a social work consult to address pt's problems with homelessness.  Pt's nurse, Eustaquio Maize, has been notified.  Jalene Mullet, [place] Triage Specialist [phone]

## 2019-07-10 ENCOUNTER — Encounter: Payer: Self-pay | Admitting: Family Medicine

## 2019-07-10 NOTE — Progress Notes (Signed)
Virtual Visit via Telephone Note  I connected with Barry Moore on 07/10/19 at  3:10 PM EST by telephone and verified that I am speaking with the correct person using two identifiers.   I discussed the limitations, risks, security and privacy concerns of performing an evaluation and management service by telephone and the availability of in person appointments. I also discussed with the patient that there may be a patient responsible charge related to this service. The patient expressed understanding and agreed to proceed.  Patient Location: Home Provider Location: [facility] Office Others participating in call: none   History of Present Illness:         54 year old male who is seen status post hospitalization from 12/31/2018 through 01/03/2019 at [facility] after presenting to the ED because of dark, tarry stools and lower abdominal pain which started 1 day prior to ED visit.  Patient reports that he does drink alcohol on a daily basis and had not been taking his medication for acid reflux for a while prior to presenting to the emergency department.  Per his hospital notes, patient was found to have hemoglobin of 9.5 in the emergency department and platelet count of 35,000 along with heme positive stools.  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  Hemoglobin remained stable at 9.6.           Patient reports no current abdominal pain since hospital discharge.  He does continue to drink alcohol.  He reports that he does have recurrent nausea.  Patient with a history of pancreatitis in addition to his alcoholic liver disease and chronic alcohol abuse.  He additionally has history of hypertension as well as depression/mood disorder related to his alcohol use.  He denies any current blood in the stools  and no black stools.  He does continue to have some fatigue but feels that this is improved.  He reports that he does take his reflux medication since hospital discharge.  He does have upcoming GI appointment.  He denies chest pain or palpitations, no shortness of breath or cough and no suicidal thoughts or ideations.  Past Medical History:  Diagnosis Date  . Alcoholic hepatitis   . Depression   . ETOH abuse   . Gastric bleed 08/2018  . Hypertension   . Pancreatitis   . Seizures ([place])   . Suicidal behavior     Past Surgical History:  Procedure Laterality Date  . BIOPSY  08/05/2018   Procedure: BIOPSY;  Surgeon: Rush Landmark Telford Nab., MD;  Location: [place];  Service: Gastroenterology;;  . BIOPSY  08/07/2018   Procedure: BIOPSY;  Surgeon: Thornton Park, MD;  Location: [facility] ENDOSCOPY;  Service: Gastroenterology;;  . BIOPSY  01/02/2019   Procedure: BIOPSY;  Surgeon: Gatha Mayer, MD;  Location: [facility];  Service: Endoscopy;;  . COLONOSCOPY WITH PROPOFOL N/A 08/07/2018   Procedure: COLONOSCOPY WITH PROPOFOL;  Surgeon: Thornton Park, MD;  Location: [place];  Service: Gastroenterology;  Laterality: N/A;  . ESOPHAGOGASTRODUODENOSCOPY N/A 01/02/2019   Procedure: ESOPHAGOGASTRODUODENOSCOPY (EGD);  Surgeon: Gatha Mayer, MD;  Location: [facility];  Service: Endoscopy;  Laterality: N/A;  . ESOPHAGOGASTRODUODENOSCOPY N/A 07/03/2019   Procedure: ESOPHAGOGASTRODUODENOSCOPY (EGD);  Surgeon: Wonda Horner, MD;  Location: [facility];  Service: Endoscopy;  Laterality: N/A;  . ESOPHAGOGASTRODUODENOSCOPY (EGD) WITH PROPOFOL N/A  11/02/2016   Procedure: ESOPHAGOGASTRODUODENOSCOPY (EGD) WITH PROPOFOL;  Surgeon: Carol Ada, MD;  Location: [facility];  Service: Endoscopy;  Laterality: N/A;  . ESOPHAGOGASTRODUODENOSCOPY (EGD) WITH PROPOFOL N/A 08/05/2018   Procedure: ESOPHAGOGASTRODUODENOSCOPY (EGD) WITH PROPOFOL;  Surgeon: Rush Landmark Telford Nab., MD;  Location: [place];   Service: Gastroenterology;  Laterality: N/A;  . HERNIA REPAIR    . LEG SURGERY    . POLYPECTOMY  08/07/2018   Procedure: POLYPECTOMY;  Surgeon: Thornton Park, MD;  Location: [facility] ENDOSCOPY;  Service: Gastroenterology;;    Family History  Problem Relation Age of Onset  . Diabetes Mother   . Alcoholism Mother   . Emphysema Father   . Lung cancer Father   . Alcoholism Father     Social History   Tobacco Use  . Smoking status: Current Every Day Smoker    Packs/day: 1.00    Types: Cigarettes  . Smokeless tobacco: Never Used  Substance Use Topics  . Alcohol use: Yes    Comment: 5+ BEERS DAILY  . Drug use: Not Currently    Frequency: 3.0 times per week    Types: Cocaine     Allergies  Allergen Reactions  . Tomato Shortness Of Breath and Nausea And Vomiting  . Aspirin Nausea And Vomiting  . Sulfa Antibiotics        Observations/Objective: No vital signs or physical exam conducted as visit was done via telephone  Assessment and Plan: 1. Essential hypertension He has a history of hypertension and during hospitalization his blood pressure was initially elevated on 01/02/2019 at 151/88 but was down to 140/79 on 01/03/2019.  He was not prescribed medication at time of hospital discharge.  He has been asked to make an appointment here in the office in the next 4 weeks for blood pressure follow-up in addition to follow-up of chronic medical issues.  He may benefit from beta-blocker to help with portal hypertension due to history of esophageal varices/alcoholic liver disease.  2. Acute blood loss anemia; history of GI bleed Patient has been asked to come into the office in the next 1 to 2 weeks to have CBC in follow-up of his acute blood loss anemia and to keep GI referral as per hospital discharge.  New referral placed in case patient has missed prior hospital follow-up appointment. - CBC; Future - Ambulatory referral to Gastroenterology  3. Alcoholic liver disease ([place]) Patient  was counseled regarding the need to decrease alcohol consumption with ultimate goal of complete cessation as patient with alcoholic liver disease, and history of GI bleed.  He has been asked to come into the office to have a comprehensive metabolic panel in follow-up of his chronic issues with alcoholic liver disease as well as CBC in follow-up of low platelet count and GI blood loss related anemia. - Comprehensive metabolic panel; Future - Ambulatory referral to Social Work - Ambulatory referral to Gastroenterology  4. Alcohol dependence with unspecified alcohol-induced disorder ([place]) 5. Alcohol-induced mood disorder ([facility]) Patient's alcohol dependence and mood disorder with depressive symptoms was discussed with the patient.  Discussed treatment options and patient is willing to follow-up with medical social worker for counseling and help with alcoholic treatment program.  He additionally agrees to start medication, Effexor/venlafaxine which will be initiated at 75 mg once daily.  He has been advised to follow-up in approximately 4 weeks regarding chronic medical issues and new medication for treatment of mood disorder/depression.  He is aware if he has any acute suicidal thoughts or ideations or other concerns  that she should follow-up at the emergency department for further evaluation and treatment. - Ambulatory referral to Social Work  6. Nausea Patient with history of GI blood loss due to duodenal erosions on EGD during recent hospitalization.  Patient's dose of pantoprazole will be increased from 40 mg once daily to 40 mg twice daily and he is to keep his upcoming GI appointment.  He has been asked to come into the office in the next 1 to 2 weeks for comprehensive metabolic panel. - Comprehensive metabolic panel; Future  7. History of pancreatitis Referral placed for patient to also follow-up with gastroenterology regarding pancreatitis.  He denies any current epigastric pain at this time but  has chronic issues with nausea. - Ambulatory referral to Gastroenterology  8. Hospital discharge follow-up Hospitalization records were reviewed and discussed with the patient.  Discussed the importance of decreasing alcohol intake with goal of the session.  Importance of following up with gastroenterology as well as taking medications as per hospital discharge including folic acid, thiamine and daily multivitamin.  Patient's Protonix was increased from 40 mg once daily to twice daily due to patient's complaint of nausea and he is to keep follow-up with gastroenterology.   Follow Up Instructions:Return in about 4 weeks (around 03/01/2019) for Chronic issues, sooner if needed.    I discussed the assessment and treatment plan with the patient. The patient was provided an opportunity to ask questions and all were answered. The patient agreed with the plan and demonstrated an understanding of the instructions.   The patient was advised to call back or seek an in-person evaluation if the symptoms worsen or if the condition fails to improve as anticipated.  I provided 12 minutes of non-face-to-face time during this encounter.   Antony Blackbird, MD

## 2019-07-13 ENCOUNTER — Inpatient Hospital Stay (HOSPITAL_COMMUNITY)
Admission: EM | Admit: 2019-07-13 | Discharge: 2019-07-15 | DRG: 432 | Disposition: A | Discharge status: Home / Self Care | Payer: Self-pay | Attending: Internal Medicine | Admitting: Internal Medicine

## 2019-07-13 ENCOUNTER — Other Ambulatory Visit: Payer: Self-pay

## 2019-07-13 ENCOUNTER — Encounter (HOSPITAL_COMMUNITY): Payer: Self-pay | Admitting: Emergency Medicine

## 2019-07-13 DIAGNOSIS — D62 Acute posthemorrhagic anemia: Secondary | ICD-10-CM | POA: Diagnosis present

## 2019-07-13 DIAGNOSIS — K2980 Duodenitis without bleeding: Secondary | ICD-10-CM | POA: Diagnosis present

## 2019-07-13 DIAGNOSIS — F1020 Alcohol dependence, uncomplicated: Secondary | ICD-10-CM | POA: Diagnosis present

## 2019-07-13 DIAGNOSIS — I8511 Secondary esophageal varices with bleeding: Secondary | ICD-10-CM | POA: Diagnosis present

## 2019-07-13 DIAGNOSIS — Z833 Family history of diabetes mellitus: Secondary | ICD-10-CM

## 2019-07-13 DIAGNOSIS — Y908 Blood alcohol level of 240 mg/100 ml or more: Secondary | ICD-10-CM | POA: Diagnosis present

## 2019-07-13 DIAGNOSIS — K922 Gastrointestinal hemorrhage, unspecified: Secondary | ICD-10-CM | POA: Diagnosis present

## 2019-07-13 DIAGNOSIS — F10220 Alcohol dependence with intoxication, uncomplicated: Secondary | ICD-10-CM | POA: Diagnosis present

## 2019-07-13 DIAGNOSIS — K7030 Alcoholic cirrhosis of liver without ascites: Principal | ICD-10-CM | POA: Diagnosis present

## 2019-07-13 DIAGNOSIS — I10 Essential (primary) hypertension: Secondary | ICD-10-CM | POA: Diagnosis present

## 2019-07-13 DIAGNOSIS — D509 Iron deficiency anemia, unspecified: Secondary | ICD-10-CM | POA: Diagnosis present

## 2019-07-13 DIAGNOSIS — F17210 Nicotine dependence, cigarettes, uncomplicated: Secondary | ICD-10-CM | POA: Diagnosis present

## 2019-07-13 DIAGNOSIS — D6959 Other secondary thrombocytopenia: Secondary | ICD-10-CM | POA: Diagnosis present

## 2019-07-13 DIAGNOSIS — D696 Thrombocytopenia, unspecified: Secondary | ICD-10-CM | POA: Diagnosis present

## 2019-07-13 DIAGNOSIS — Z20822 Contact with and (suspected) exposure to covid-19: Secondary | ICD-10-CM | POA: Diagnosis present

## 2019-07-13 DIAGNOSIS — Z590 Homelessness: Secondary | ICD-10-CM

## 2019-07-13 DIAGNOSIS — Z801 Family history of malignant neoplasm of trachea, bronchus and lung: Secondary | ICD-10-CM

## 2019-07-13 DIAGNOSIS — Z811 Family history of alcohol abuse and dependence: Secondary | ICD-10-CM

## 2019-07-13 DIAGNOSIS — F10920 Alcohol use, unspecified with intoxication, uncomplicated: Secondary | ICD-10-CM

## 2019-07-13 DIAGNOSIS — K766 Portal hypertension: Secondary | ICD-10-CM | POA: Diagnosis present

## 2019-07-13 DIAGNOSIS — Z825 Family history of asthma and other chronic lower respiratory diseases: Secondary | ICD-10-CM

## 2019-07-13 DIAGNOSIS — K2970 Gastritis, unspecified, without bleeding: Secondary | ICD-10-CM | POA: Diagnosis present

## 2019-07-13 LAB — COMPREHENSIVE METABOLIC PANEL
ALT: 30 U/L (ref 0–44)
AST: 113 U/L — ABNORMAL HIGH (ref 15–41)
Albumin: 3.6 g/dL (ref 3.5–5.0)
Alkaline Phosphatase: 181 U/L — ABNORMAL HIGH (ref 38–126)
Anion gap: 10 (ref 5–15)
BUN: 9 mg/dL (ref 6–20)
CO2: 27 mmol/L (ref 22–32)
Calcium: 8.7 mg/dL — ABNORMAL LOW (ref 8.9–10.3)
Chloride: 103 mmol/L (ref 98–111)
Creatinine, Ser: 0.74 mg/dL (ref 0.61–1.24)
GFR calc Af Amer: >60 mL/min (ref >60)
GFR calc non Af Amer: >60 mL/min (ref >60)
Glucose, Bld: 103 mg/dL — ABNORMAL HIGH (ref 70–99)
Potassium: 3.8 mmol/L (ref 3.5–5.1)
Sodium: 140 mmol/L (ref 135–145)
Total Bilirubin: 0.6 mg/dL (ref 0.3–1.2)
Total Protein: 8.6 g/dL — ABNORMAL HIGH (ref 6.5–8.1)

## 2019-07-13 LAB — PROTIME-INR
INR: 1.1 (ref 0.8–1.2)
Prothrombin Time: 13.7 seconds (ref 11.4–15.2)

## 2019-07-13 LAB — TYPE AND SCREEN
ABO/RH(D): O POS
Antibody Screen: NEGATIVE

## 2019-07-13 LAB — ETHANOL: Alcohol, Ethyl (B): 351 mg/dL (ref <10)

## 2019-07-13 LAB — POC OCCULT BLOOD, ED: Fecal Occult Bld: POSITIVE — AB

## 2019-07-13 MED ORDER — SODIUM CHLORIDE 0.9 % IV SOLN
80.0000 mg | Freq: Once | INTRAVENOUS | Status: AC
  Administered 2019-07-14: 80 mg via INTRAVENOUS
  Filled 2019-07-13: qty 80

## 2019-07-13 MED ORDER — LORAZEPAM 2 MG/ML IJ SOLN
0.0000 mg | Freq: Four times a day (QID) | INTRAMUSCULAR | Status: DC
  Administered 2019-07-13: 23:00:00 1 mg via INTRAVENOUS
  Filled 2019-07-13: qty 1

## 2019-07-13 MED ORDER — LORAZEPAM 1 MG PO TABS: 0.0000 mg | ORAL_TABLET | Freq: Two times a day (BID) | ORAL | Status: DC

## 2019-07-13 MED ORDER — SODIUM CHLORIDE 0.9 % IV SOLN
8.0000 mg/h | INTRAVENOUS | Status: DC
  Administered 2019-07-14 (×2): 8 mg/h via INTRAVENOUS
  Filled 2019-07-13 (×3): qty 80

## 2019-07-13 MED ORDER — LORAZEPAM 2 MG/ML IJ SOLN: 0.0000 mg | Freq: Two times a day (BID) | INTRAMUSCULAR | Status: DC

## 2019-07-13 MED ORDER — THIAMINE HCL 100 MG/ML IJ SOLN: 100.0000 mg | Freq: Every day | INTRAMUSCULAR | Status: DC

## 2019-07-13 MED ORDER — LORAZEPAM 1 MG PO TABS: 0.0000 mg | ORAL_TABLET | Freq: Four times a day (QID) | ORAL | Status: DC

## 2019-07-13 MED ORDER — PANTOPRAZOLE SODIUM 40 MG IV SOLR: 40.0000 mg | Freq: Two times a day (BID) | INTRAVENOUS | Status: DC

## 2019-07-13 MED ORDER — THIAMINE HCL 100 MG PO TABS
100.0000 mg | ORAL_TABLET | Freq: Every day | ORAL | Status: DC
  Administered 2019-07-14 – 2019-07-15 (×2): 100 mg via ORAL
  Filled 2019-07-13 (×2): qty 1

## 2019-07-13 NOTE — ED Triage Notes (Signed)
Patient brought in by [facility] from [facility]. Patient complaining of unwitnessed seizure that patient lasted 10 min. Patient states this happens when he does not drink enough.

## 2019-07-13 NOTE — ED Provider Notes (Signed)
[facility] DEPT Provider Note   CSN: [id number] Arrival date & time: 07/13/19  2042     History Chief Complaint  Patient presents with  . Alcohol Intoxication  . Abdominal Pain    Barry Moore is a 54 y.o. male with history of ETOH abuse, GI bleed, cirrhosis, pancreatitis, homelessness who presents with abdominal pain. He states that he has generalized abdominal pain which started today along with three episodes of passing black stools. He has had this before and was admitted earlier this month for the same. EGD was performed at that time which showed a normal esophagus, erythematous mucosa in the antrum, erythematous duodenopathy. Per chart review it was noted he had small esophageal varices in May 2020. He has not been on any medicines. He denies hematemesis but reports dry heaves. Pt states his last drink was some time this afternoon. He reports drinking 4 40oz beer daily. He states he also had a seizure earlier today which he will have when he doesn't drink enough.  HPI     Past Medical History:  Diagnosis Date  . Alcoholic hepatitis   . Depression   . ETOH abuse   . Gastric bleed 08/2018  . Hypertension   . Pancreatitis   . Seizures ([place])   . Suicidal behavior     Patient Active Problem List   Diagnosis Date Noted  . Acute upper GI bleeding 07/02/2019  . Duodenitis   . Acute upper GI bleed 01/01/2019  . Hypokalemia 01/01/2019  . Alcohol dependence ([place]) 01/01/2019  . Alcoholic liver disease ([place]) 01/01/2019  . Acute pancreatitis 12/13/2018  . Free intraperitoneal air 12/13/2018  . Homicidal ideation   . Gastrointestinal hemorrhage with melena 08/31/2018  . Colon ulcer   . Polyp of ascending colon   . Acute GI bleeding 08/05/2018  . Transaminitis 08/05/2018  . Abdominal pain 08/05/2018  . Nausea and vomiting 08/05/2018  . Substance induced mood disorder ([place]) 07/16/2017  . Severe recurrent major depression without psychotic  features ([place]) 05/15/2017  . Microcytic anemia 03/18/2017  . Acute blood loss anemia 02/21/2017  . Upper GI bleed 02/08/2017  . Alcohol withdrawal ([place]) 02/08/2017  . Cocaine abuse ([place]) 11/18/2016  . GI bleed 11/01/2016  . Alcohol abuse   . Major depressive disorder, recurrent severe without psychotic features ([place]) 07/06/2016  . Alcohol-induced mood disorder ([place]) 06/15/2016  . Seizure ([place]) 05/26/2016  . Tobacco abuse 05/26/2016  . Homeless 05/26/2016  . Essential hypertension 05/26/2016  . Alcohol dependence with alcohol-induced mood disorder ([place]) 02/14/2016  . Severe alcohol withdrawal without perceptual disturbances without complication ([place]) [id number]  . Alcoholic hepatitis without ascites 02/14/2016  . Thrombocytopenia ([place]) 02/14/2016    Past Surgical History:  Procedure Laterality Date  . BIOPSY  08/05/2018   Procedure: BIOPSY;  Surgeon: Rush Landmark Telford Nab., MD;  Location: [facility];  Service: Gastroenterology;;  . BIOPSY  08/07/2018   Procedure: BIOPSY;  Surgeon: Thornton Park, MD;  Location: [facility] ENDOSCOPY;  Service: Gastroenterology;;  . BIOPSY  01/02/2019   Procedure: BIOPSY;  Surgeon: Gatha Mayer, MD;  Location: [facility] ENDOSCOPY;  Service: Endoscopy;;  . COLONOSCOPY WITH PROPOFOL N/A 08/07/2018   Procedure: COLONOSCOPY WITH PROPOFOL;  Surgeon: Thornton Park, MD;  Location: [facility];  Service: Gastroenterology;  Laterality: N/A;  . ESOPHAGOGASTRODUODENOSCOPY N/A 01/02/2019   Procedure: ESOPHAGOGASTRODUODENOSCOPY (EGD);  Surgeon: Gatha Mayer, MD;  Location: [facility] ENDOSCOPY;  Service: Endoscopy;  Laterality: N/A;  . ESOPHAGOGASTRODUODENOSCOPY N/A 07/03/2019   Procedure: ESOPHAGOGASTRODUODENOSCOPY (EGD);  Surgeon: Wonda Horner, MD;  Location: [facility] ENDOSCOPY;  Service: Endoscopy;  Laterality: N/A;  . ESOPHAGOGASTRODUODENOSCOPY (EGD) WITH PROPOFOL N/A 11/02/2016   Procedure: ESOPHAGOGASTRODUODENOSCOPY (EGD) WITH PROPOFOL;  Surgeon: Carol Ada, MD;   Location: [facility] ENDOSCOPY;  Service: Endoscopy;  Laterality: N/A;  . ESOPHAGOGASTRODUODENOSCOPY (EGD) WITH PROPOFOL N/A 08/05/2018   Procedure: ESOPHAGOGASTRODUODENOSCOPY (EGD) WITH PROPOFOL;  Surgeon: Rush Landmark Telford Nab., MD;  Location: [facility];  Service: Gastroenterology;  Laterality: N/A;  . HERNIA REPAIR    . LEG SURGERY    . POLYPECTOMY  08/07/2018   Procedure: POLYPECTOMY;  Surgeon: Thornton Park, MD;  [facility] ENDOSCOPY;  Service: Gastroenterology;;       Family History  Problem Relation Age of Onset  . Diabetes Mother   . Alcoholism Mother   . Emphysema Father   . Lung cancer Father   . Alcoholism Father     Social History   Tobacco Use  . Smoking status: Current Every Day Smoker    Packs/day: 1.00    Types: Cigarettes  . Smokeless tobacco: Never Used  Substance Use Topics  . Alcohol use: Yes    Comment: 5+ BEERS DAILY  . Drug use: Not Currently    Frequency: 3.0 times per week    Types: Cocaine    Home Medications Prior to Admission medications   Medication Sig Start Date End Date Taking? Authorizing Provider  pantoprazole (PROTONIX) 40 MG tablet Take 1 tablet (40 mg total) by mouth daily. 07/04/19 08/03/19  Arrien, Jimmy Picket, MD  ferrous sulfate 325 (65 FE) MG tablet Take 1 tablet (325 mg total) by mouth daily with breakfast. Patient not taking: Reported on 09/23/2018 08/09/18 10/09/18  Elodia Florence., MD    Allergies    Tomato, Aspirin, and Sulfa antibiotics  Review of Systems   Review of Systems  Constitutional: Negative for fever.  Respiratory: Negative for shortness of breath.   Cardiovascular: Negative for chest pain.  Gastrointestinal: Positive for abdominal pain, blood in stool, nausea and vomiting.  Neurological: Negative for syncope and light-headedness.  All other systems reviewed and are negative.   Physical Exam Updated Vital Signs BP 126/84   Pulse 78   Temp 98.9 F (37.2 C) (Oral)   Resp 18   Ht 5\' 4"  (1.626 m)    SpO2 98%   BMI 30.88 kg/m   Physical Exam Vitals and nursing note reviewed.  Constitutional:      General: He is not in acute distress.    Appearance: He is well-developed. He is not ill-appearing.     Comments: Disheveled chronically ill appearing. Intoxicated  HENT:     Head: Normocephalic and atraumatic.  Eyes:     General: No scleral icterus.       Right eye: No discharge.        Left eye: No discharge.     Conjunctiva/sclera: Conjunctivae normal.     Pupils: Pupils are equal, round, and reactive to light.  Cardiovascular:     Rate and Rhythm: Normal rate and regular rhythm.  Pulmonary:     Effort: Pulmonary effort is normal. No respiratory distress.     Breath sounds: Normal breath sounds.  Abdominal:     General: There is no distension.     Palpations: Abdomen is soft.     Tenderness: There is abdominal tenderness (generalized).  Genitourinary:    Comments: Rectal: Melena noted on anus and with digital exam Musculoskeletal:     Cervical back: Normal range of motion.  Skin:    General: Skin is warm and dry.  Neurological:     Mental Status: He is alert and oriented to person, place, and time.  Psychiatric:        Behavior: Behavior normal.     ED Results / Procedures / Treatments   Labs (all labs ordered are listed, but only abnormal results are displayed) Labs Reviewed  COMPREHENSIVE METABOLIC PANEL - Abnormal; Notable for the following components:      Result Value   Glucose, Bld 103 (*)    Calcium 8.7 (*)    Total Protein 8.6 (*)    AST 113 (*)    Alkaline Phosphatase 181 (*)    All other components within normal limits  CBC WITH DIFFERENTIAL/PLATELET - Abnormal; Notable for the following components:   RBC 3.83 (*)    Hemoglobin 8.7 (*)    HCT 29.3 (*)    MCV 76.5 (*)    MCH 22.7 (*)    MCHC 29.7 (*)    RDW 24.7 (*)    Platelets 128 (*)    All other components within normal limits  ETHANOL - Abnormal; Notable for the following components:    Alcohol, Ethyl (B) 351 (*)    All other components within normal limits  POC OCCULT BLOOD, ED - Abnormal; Notable for the following components:   Fecal Occult Bld POSITIVE (*)    All other components within normal limits  PROTIME-INR  TYPE AND SCREEN    EKG None  Radiology No results found.  Procedures Procedures (including critical care time)  Medications Ordered in ED Medications  LORazepam (ATIVAN) injection 0-4 mg (1 mg Intravenous Given 07/13/19 2238)    Or  LORazepam (ATIVAN) tablet 0-4 mg ( Oral See Alternative 07/13/19 2238)  LORazepam (ATIVAN) injection 0-4 mg (has no administration in time range)    Or  LORazepam (ATIVAN) tablet 0-4 mg (has no administration in time range)  thiamine tablet 100 mg (has no administration in time range)    Or  thiamine (B-1) injection 100 mg (has no administration in time range)  pantoprazole (PROTONIX) 80 mg in sodium chloride 0.9 % 100 mL IVPB (has no administration in time range)  pantoprazole (PROTONIX) 80 mg in sodium chloride 0.9 % 100 mL (0.8 mg/mL) infusion (has no administration in time range)  pantoprazole (PROTONIX) injection 40 mg (has no administration in time range)    ED Course  I have reviewed the triage vital signs and the nursing notes.  Pertinent labs & imaging results that were available during my care of the patient were reviewed by me and considered in my medical decision making (see chart for details).  54 year old male presents with acute upper GI bleed.  Patient is acutely intoxicated and cannot provide a detailed history but he tells me that he is having abdominal pain and passing black stools.  Heart is regular rate and rhythm.  Lungs are clear to auscultation.  Abdomen is soft and generally tender.  Rectal exam is remarkable for melena.  Will obtain labs and start IV Protonix  CBC is remarkable for hemoglobin of 8.7 which is stable compared to when it was last checked on the 15th.  Patient also has  thrombocytopenia which is stable.  CMP is reassuring.  INR is 1.1.  EtOH is 351.  Shared visit with Dr. Kathrynn Humble.  Will discuss with GI.  Patient started on CIWA protocol and type and screened.  11:45 PM Discussed with Dr. Alessandra Bevels with [facility] who  recommends patient be started on Protonix drip, and made n.p.o., and they will see in the morning.  Discussed with Dr. Myna [name] with [facility] who will admit  MDM Rules/Calculators/A&P                       Final Clinical Impression(s) / ED Diagnoses Final diagnoses:  Alcoholic intoxication without complication ([place])  Upper GI bleed    Rx / DC Orders ED Discharge Orders    None       Recardo Evangelist, PA-C 07/13/19 Trion, Ankit, MD 07/15/19 1746

## 2019-07-13 NOTE — ED Notes (Signed)
Date and time results received: 07/13/19 2325 (use smartphrase ".now" to insert current time)  Test: etoh Critical Value: 351  Name of Provider Notified: belfi md  Orders Received? Or Actions Taken?: no new orders.

## 2019-07-14 ENCOUNTER — Inpatient Hospital Stay (HOSPITAL_COMMUNITY): Payer: Self-pay | Admitting: Anesthesiology

## 2019-07-14 ENCOUNTER — Encounter (HOSPITAL_COMMUNITY): Payer: Self-pay | Admitting: Family Medicine

## 2019-07-14 ENCOUNTER — Encounter (HOSPITAL_COMMUNITY)
Admission: EM | Disposition: A | Discharge status: Home / Self Care | Payer: Self-pay | Source: Home / Self Care | Attending: Internal Medicine

## 2019-07-14 DIAGNOSIS — K922 Gastrointestinal hemorrhage, unspecified: Secondary | ICD-10-CM

## 2019-07-14 HISTORY — PX: ESOPHAGOGASTRODUODENOSCOPY (EGD) WITH PROPOFOL: SHX5813

## 2019-07-14 LAB — CBC WITH DIFFERENTIAL/PLATELET
Abs Immature Granulocytes: 0 10*3/uL (ref 0.00–0.07)
Basophils Absolute: 0.1 10*3/uL (ref 0.0–0.1)
Basophils Relative: 2 %
Eosinophils Absolute: 0.1 10*3/uL (ref 0.0–0.5)
Eosinophils Relative: 3 %
HCT: 29.3 % — ABNORMAL LOW (ref 39.0–52.0)
Hemoglobin: 8.7 g/dL — ABNORMAL LOW (ref 13.0–17.0)
Immature Granulocytes: 0 %
Lymphocytes Relative: 35 %
Lymphs Abs: 1.6 10*3/uL (ref 0.7–4.0)
MCH: 22.7 pg — ABNORMAL LOW (ref 26.0–34.0)
MCHC: 29.7 g/dL — ABNORMAL LOW (ref 30.0–36.0)
MCV: 76.5 fL — ABNORMAL LOW (ref 80.0–100.0)
Monocytes Absolute: 0.6 10*3/uL (ref 0.1–1.0)
Monocytes Relative: 13 %
Neutro Abs: 2.1 10*3/uL (ref 1.7–7.7)
Neutrophils Relative %: 47 %
Platelets: 128 10*3/uL — ABNORMAL LOW (ref 150–400)
RBC: 3.83 MIL/uL — ABNORMAL LOW (ref 4.22–5.81)
RDW: 24.7 % — ABNORMAL HIGH (ref 11.5–15.5)
WBC: 4.5 10*3/uL (ref 4.0–10.5)
nRBC: 0 % (ref 0.0–0.2)

## 2019-07-14 LAB — COMPREHENSIVE METABOLIC PANEL
ALT: 27 U/L (ref 0–44)
AST: 96 U/L — ABNORMAL HIGH (ref 15–41)
Albumin: 3.4 g/dL — ABNORMAL LOW (ref 3.5–5.0)
Alkaline Phosphatase: 159 U/L — ABNORMAL HIGH (ref 38–126)
Anion gap: 12 (ref 5–15)
BUN: 11 mg/dL (ref 6–20)
CO2: 24 mmol/L (ref 22–32)
Calcium: 8.5 mg/dL — ABNORMAL LOW (ref 8.9–10.3)
Chloride: 104 mmol/L (ref 98–111)
Creatinine, Ser: 0.63 mg/dL (ref 0.61–1.24)
GFR calc Af Amer: >60 mL/min (ref >60)
GFR calc non Af Amer: >60 mL/min (ref >60)
Glucose, Bld: 88 mg/dL (ref 70–99)
Potassium: 4 mmol/L (ref 3.5–5.1)
Sodium: 140 mmol/L (ref 135–145)
Total Bilirubin: 0.6 mg/dL (ref 0.3–1.2)
Total Protein: 7.6 g/dL (ref 6.5–8.1)

## 2019-07-14 LAB — CBC
HCT: 26.3 % — ABNORMAL LOW (ref 39.0–52.0)
Hemoglobin: 7.8 g/dL — ABNORMAL LOW (ref 13.0–17.0)
MCH: 22.8 pg — ABNORMAL LOW (ref 26.0–34.0)
MCHC: 29.7 g/dL — ABNORMAL LOW (ref 30.0–36.0)
MCV: 76.9 fL — ABNORMAL LOW (ref 80.0–100.0)
Platelets: 118 10*3/uL — ABNORMAL LOW (ref 150–400)
RBC: 3.42 MIL/uL — ABNORMAL LOW (ref 4.22–5.81)
RDW: 24.7 % — ABNORMAL HIGH (ref 11.5–15.5)
WBC: 3.4 10*3/uL — ABNORMAL LOW (ref 4.0–10.5)
nRBC: 0 % (ref 0.0–0.2)

## 2019-07-14 LAB — MAGNESIUM: Magnesium: 2.3 mg/dL (ref 1.7–2.4)

## 2019-07-14 LAB — RESPIRATORY PANEL BY RT PCR (FLU A&B, COVID)
Influenza A by PCR: NEGATIVE
Influenza B by PCR: NEGATIVE
SARS Coronavirus 2 by RT PCR: NEGATIVE

## 2019-07-14 LAB — HEMATOCRIT
HCT: 26 % — ABNORMAL LOW (ref 39.0–52.0)
HCT: 26.3 % — ABNORMAL LOW (ref 39.0–52.0)

## 2019-07-14 LAB — PHOSPHORUS: Phosphorus: 3.9 mg/dL (ref 2.5–4.6)

## 2019-07-14 LAB — HEMOGLOBIN
Hemoglobin: 7.8 g/dL — ABNORMAL LOW (ref 13.0–17.0)
Hemoglobin: 7.8 g/dL — ABNORMAL LOW (ref 13.0–17.0)

## 2019-07-14 SURGERY — ESOPHAGOGASTRODUODENOSCOPY (EGD) WITH PROPOFOL
Anesthesia: Monitor Anesthesia Care

## 2019-07-14 MED ORDER — FOLIC ACID 1 MG PO TABS
1.0000 mg | ORAL_TABLET | Freq: Every day | ORAL | Status: DC
  Administered 2019-07-14 – 2019-07-15 (×2): 1 mg via ORAL
  Filled 2019-07-14 (×2): qty 1

## 2019-07-14 MED ORDER — LORAZEPAM 1 MG PO TABS: 1.0000 mg | ORAL_TABLET | ORAL | Status: DC | PRN

## 2019-07-14 MED ORDER — ONDANSETRON HCL 4 MG/2ML IJ SOLN: 4.0000 mg | Freq: Four times a day (QID) | INTRAMUSCULAR | Status: DC | PRN

## 2019-07-14 MED ORDER — PROPOFOL 10 MG/ML IV BOLUS
INTRAVENOUS | Status: DC | PRN
  Administered 2019-07-14 (×2): 30 mg via INTRAVENOUS

## 2019-07-14 MED ORDER — SODIUM CHLORIDE 0.9 % IV SOLN
1.0000 g | Freq: Every day | INTRAVENOUS | Status: DC
  Administered 2019-07-14 – 2019-07-15 (×2): 1 g via INTRAVENOUS
  Filled 2019-07-14 (×2): qty 1

## 2019-07-14 MED ORDER — SODIUM CHLORIDE 0.9% FLUSH
3.0000 mL | Freq: Two times a day (BID) | INTRAVENOUS | Status: DC
  Administered 2019-07-14: 3 mL via INTRAVENOUS

## 2019-07-14 MED ORDER — HYDRALAZINE HCL 20 MG/ML IJ SOLN
5.0000 mg | Freq: Four times a day (QID) | INTRAMUSCULAR | Status: DC | PRN
  Administered 2019-07-14: 5 mg via INTRAVENOUS
  Filled 2019-07-14: qty 1

## 2019-07-14 MED ORDER — LORAZEPAM 2 MG/ML IJ SOLN
0.0000 mg | Freq: Four times a day (QID) | INTRAMUSCULAR | Status: DC
  Administered 2019-07-14 – 2019-07-15 (×3): 1 mg via INTRAVENOUS
  Filled 2019-07-14 (×4): qty 1

## 2019-07-14 MED ORDER — METOPROLOL TARTRATE 5 MG/5ML IV SOLN
5.0000 mg | Freq: Four times a day (QID) | INTRAVENOUS | Status: DC
  Administered 2019-07-14 – 2019-07-15 (×2): 5 mg via INTRAVENOUS
  Filled 2019-07-14 (×2): qty 5

## 2019-07-14 MED ORDER — LORAZEPAM 2 MG/ML IJ SOLN: 0.0000 mg | Freq: Two times a day (BID) | INTRAMUSCULAR | Status: DC

## 2019-07-14 MED ORDER — ADULT MULTIVITAMIN W/MINERALS CH
1.0000 | ORAL_TABLET | Freq: Every day | ORAL | Status: DC
  Administered 2019-07-14 – 2019-07-15 (×2): 1 via ORAL
  Filled 2019-07-14 (×2): qty 1

## 2019-07-14 MED ORDER — LORAZEPAM 2 MG/ML IJ SOLN
1.0000 mg | INTRAMUSCULAR | Status: DC | PRN
  Administered 2019-07-14: 10:00:00 1 mg via INTRAVENOUS
  Administered 2019-07-14: 2 mg via INTRAVENOUS
  Administered 2019-07-14: 17:00:00 3 mg via INTRAVENOUS
  Administered 2019-07-15: 08:00:00 2 mg via INTRAVENOUS
  Administered 2019-07-15: 11:00:00 1 mg via INTRAVENOUS
  Filled 2019-07-14: qty 2
  Filled 2019-07-14 (×3): qty 1

## 2019-07-14 MED ORDER — SODIUM CHLORIDE 0.9 % IV SOLN: INTRAVENOUS | Status: AC

## 2019-07-14 MED ORDER — ONDANSETRON HCL 4 MG PO TABS: 4.0000 mg | ORAL_TABLET | Freq: Four times a day (QID) | ORAL | Status: DC | PRN

## 2019-07-14 MED ORDER — PANTOPRAZOLE SODIUM 40 MG PO TBEC
40.0000 mg | DELAYED_RELEASE_TABLET | Freq: Two times a day (BID) | ORAL | Status: DC
  Administered 2019-07-14 – 2019-07-15 (×3): 40 mg via ORAL
  Filled 2019-07-14 (×3): qty 1

## 2019-07-14 MED ORDER — PROPOFOL 500 MG/50ML IV EMUL
INTRAVENOUS | Status: AC
  Filled 2019-07-14: qty 50

## 2019-07-14 MED ORDER — PROPOFOL 500 MG/50ML IV EMUL
INTRAVENOUS | Status: DC | PRN
  Administered 2019-07-14: 125 ug/kg/min via INTRAVENOUS

## 2019-07-14 SURGICAL SUPPLY — 14 items

## 2019-07-14 NOTE — Consult Note (Signed)
Referring Provider:  EDP Primary Care Physician:  Patient, No Pcp Per Primary Gastroenterologist:   GI/ Mansouraty, Telford Nab., MD -patient had office appointment scheduled but he was no-show.  Reason for Consultation: Upper GI bleed  HPI: Barry Moore is a 54 y.o. male with past medical history of alcohol abuse, history of duodenal ulcer and recurrent GI bleed admitted to the hospital with upper GI bleed.  GI is consulted for further evaluation.  Patient with multiple recent EGDs.  Please see previous GI work-up for detail.  Patient seen and examined at bedside.  He started noticing black-colored stool yesterday.  Had 2 bowel movement so far.  Denies any associated diarrhea or constipation.  Denies any nausea or vomiting.  Denies abdominal pain.  Continues to use alcohol with last drink yesterday prior to admission.  Previous GI work-up EGD on July 03, 2019 showed erythematous mucosa in the antrum and the duodenum  otherwise no acute bleeding.  EGD in October 2020 showed duodenal erosions and some mucosal changes otherwise no evidence of active bleeding or varices.  Colonoscopy in May 2020 showed ascending colon ulcer, few small colon polyps and internal hemorrhoids.  Colonoscopy showed small tubular adenoma.  Repeat was recommended in 7 years.  EGD in May 2020 showed small esophageal varices, duodenal ulcers, white specks in esophagus.  Biopsy was positive for Candida esophagitis.  Gastric biopsies were negative for H. pylori   Past Medical History:  Diagnosis Date  . Alcoholic hepatitis   . Depression   . ETOH abuse   . Gastric bleed 08/2018  . Hypertension   . Pancreatitis   . Seizures ([place])   . Suicidal behavior     Past Surgical History:  Procedure Laterality Date  . BIOPSY  08/05/2018   Procedure: BIOPSY;  Surgeon: Rush Landmark Telford Nab., MD;  Location: [facility];  Service: Gastroenterology;;  . BIOPSY  08/07/2018   Procedure: BIOPSY;  Surgeon: Thornton Park, MD;  Location: [facility] ENDOSCOPY;  Service: Gastroenterology;;  . BIOPSY  01/02/2019   Procedure: BIOPSY;  Surgeon: Gatha Mayer, MD;  Location: [facility] ENDOSCOPY;  Service: Endoscopy;;  . COLONOSCOPY WITH PROPOFOL N/A 08/07/2018   Procedure: COLONOSCOPY WITH PROPOFOL;  Surgeon: Thornton Park, MD;  Location: [facility];  Service: Gastroenterology;  Laterality: N/A;  . ESOPHAGOGASTRODUODENOSCOPY N/A 01/02/2019   Procedure: ESOPHAGOGASTRODUODENOSCOPY (EGD);  Surgeon: Gatha Mayer, MD;  Location: [facility] ENDOSCOPY;  Service: Endoscopy;  Laterality: N/A;  . ESOPHAGOGASTRODUODENOSCOPY N/A 07/03/2019   Procedure: ESOPHAGOGASTRODUODENOSCOPY (EGD);  Surgeon: Wonda Horner, MD;  Location: [facility] ENDOSCOPY;  Service: Endoscopy;  Laterality: N/A;  . ESOPHAGOGASTRODUODENOSCOPY (EGD) WITH PROPOFOL N/A 11/02/2016   Procedure: ESOPHAGOGASTRODUODENOSCOPY (EGD) WITH PROPOFOL;  Surgeon: Carol Ada, MD;  Location: [facility] ENDOSCOPY;  Service: Endoscopy;  Laterality: N/A;  . ESOPHAGOGASTRODUODENOSCOPY (EGD) WITH PROPOFOL N/A 08/05/2018   Procedure: ESOPHAGOGASTRODUODENOSCOPY (EGD) WITH PROPOFOL;  Surgeon: Rush Landmark Telford Nab., MD;  Location: [place];  Service: Gastroenterology;  Laterality: N/A;  . HERNIA REPAIR    . LEG SURGERY    . POLYPECTOMY  08/07/2018   Procedure: POLYPECTOMY;  Surgeon: Thornton Park, MD;  Location: [facility] ENDOSCOPY;  Service: Gastroenterology;;    Prior to Admission medications   Medication Sig Start Date End Date Taking? Authorizing Provider  pantoprazole (PROTONIX) 40 MG tablet Take 1 tablet (40 mg total) by mouth daily. 07/04/19 08/03/19  Arrien, Jimmy Picket, MD  ferrous sulfate 325 (65 FE) MG tablet Take 1 tablet (325 mg total) by mouth daily with breakfast. Patient not taking: Reported  on 09/23/2018 08/09/18 10/09/18  Elodia Florence., MD    Scheduled Meds: . folic acid  1 mg Oral Daily  . LORazepam  0-4 mg Intravenous Q6H   Followed by  . [START ON 07/16/2019]  LORazepam  0-4 mg Intravenous Q12H  . multivitamin with minerals  1 tablet Oral Daily  . [START ON 07/17/2019] pantoprazole  40 mg Intravenous Q12H  . sodium chloride flush  3 mL Intravenous Q12H  . thiamine  100 mg Oral Daily   Or  . thiamine  100 mg Intravenous Daily   Continuous Infusions: . sodium chloride 90 mL/hr at 07/14/19 0527  . cefTRIAXone (ROCEPHIN)  IV 1 g (07/14/19 0532)  . pantoprozole (PROTONIX) infusion 8 mg/hr (07/14/19 0028)   PRN Meds:.LORazepam **OR** LORazepam, ondansetron **OR** ondansetron (ZOFRAN) IV  Allergies as of 07/13/2019 - Review Complete 07/13/2019  Allergen Reaction Noted  . Tomato Shortness Of Breath and Nausea And Vomiting 02/28/2017  . Aspirin Nausea And Vomiting 11/21/2015  . Sulfa antibiotics Other (See Comments) 06/21/2019    Family History  Problem Relation Age of Onset  . Diabetes Mother   . Alcoholism Mother   . Emphysema Father   . Lung cancer Father   . Alcoholism Father     Social History   Socioeconomic History  . Marital status: Divorced    Spouse name: Not on file  . Number of children: Not on file  . Years of education: Not on file  . Highest education level: Not on file  Occupational History  . Not on file  Tobacco Use  . Smoking status: Current Every Day Smoker    Packs/day: 1.00    Types: Cigarettes  . Smokeless tobacco: Never Used  Substance and Sexual Activity  . Alcohol use: Yes    Comment: 5+ BEERS DAILY  . Drug use: Not Currently    Frequency: 3.0 times per week    Types: Cocaine  . Sexual activity: Not Currently  Other Topics Concern  . Not on file  Social History Narrative  . Not on file   Social Determinants of Health   Financial Resource Strain:   . Difficulty of Paying Living Expenses:   Food Insecurity:   . Worried About Charity fundraiser in the Last Year:   . Arboriculturist in the Last Year:   Transportation Needs:   . Film/video editor (Medical):   [name] Lack of Transportation  (Non-Medical):   Physical Activity:   . Days of Exercise per Week:   . Minutes of Exercise per Session:   Stress:   . Feeling of Stress :   Social Connections:   . Frequency of Communication with Friends and Family:   . Frequency of Social Gatherings with Friends and Family:   . Attends Religious Services:   . Active Member of Clubs or Organizations:   . Attends Archivist Meetings:   [name] Marital Status:   Intimate Partner Violence:   . Fear of Current or Ex-Partner:   . Emotionally Abused:   [name] Physically Abused:   . Sexually Abused:     Review of Systems: Review of Systems  Constitutional: Negative for chills and fever.  HENT: Negative for hearing loss and tinnitus.   Eyes: Negative for blurred vision and double vision.  Respiratory: Positive for cough. Negative for shortness of breath and wheezing.   Cardiovascular: Negative for chest pain and palpitations.  Gastrointestinal: Positive for melena. Negative for abdominal pain, constipation, diarrhea,  heartburn, nausea and vomiting.  Genitourinary: Negative for dysuria and urgency.  Musculoskeletal: Positive for back pain. Negative for joint pain.  Skin: Negative for itching and rash.  Neurological: Negative for seizures and loss of consciousness.  Endo/Heme/Allergies: Bruises/bleeds easily.  Psychiatric/Behavioral: Positive for substance abuse. Negative for hallucinations.    Physical Exam: Vital signs: Vitals:   07/14/19 0225 07/14/19 0631  BP: (!) 141/84 136/83  Pulse: 94 98  Resp: 18 16  Temp: 99.2 F (37.3 C) 98.7 F (37.1 C)  SpO2: 98% 98%   Last BM Date: 07/13/19 Physical Exam  Constitutional: He is oriented to person, place, and time. He appears well-developed and well-nourished. No distress.  HENT:  Head: Normocephalic and atraumatic.  Mouth/Throat: Oropharynx is clear and moist. No oropharyngeal exudate.  Eyes: EOM are normal. No scleral icterus.  Cardiovascular: Normal rate and normal heart  sounds.  No murmur heard. Pulmonary/Chest: Effort normal. No respiratory distress.  Abdominal: Soft. Bowel sounds are normal. He exhibits no distension. There is no abdominal tenderness. There is no rebound and no guarding.  Musculoskeletal:        General: No edema. Normal range of motion.     Cervical back: Normal range of motion and neck supple.  Neurological: He is alert and oriented to person, place, and time.  Skin: Skin is warm. No erythema.  Psychiatric: He has a normal mood and affect. Judgment and thought content normal.  Vitals reviewed.   GI:  Lab Results: Recent Labs    07/13/19 2205 07/14/19 0350  WBC 4.5 3.4*  HGB 8.7* 7.8*  HCT 29.3* 26.3*  PLT 128* 118*   BMET Recent Labs    07/13/19 2205 07/14/19 0350  NA 140 140  K 3.8 4.0  CL 103 104  CO2 27 24  GLUCOSE 103* 88  BUN 9 11  CREATININE 0.74 0.63  CALCIUM 8.7* 8.5*   LFT Recent Labs    07/14/19 0350  PROT 7.6  ALBUMIN 3.4*  AST 96*  ALT 27  ALKPHOS 159*  BILITOT 0.6   PT/INR Recent Labs    07/13/19 2205  LABPROT 13.7  INR 1.1     Studies/Results: No results found.  Impression/Plan: -Recurrent GI bleed with melena.  EGD 10 days ago was negative for active bleeding. -Acute blood loss anemia -Alcohol use -ongoing -Thrombocytopenia.  Probably from alcohol use -History of Candida esophagitis. -Abnormal LFTs.  Mild elevated AST and alkaline phosphatase.  Normal ALT and T bili.   Recommendations ------------------------- -Plan for EGD today. -Keep n.p.o. for now -Continue Protonix. -Monitor H&H.  Transfuse if hemoglobin less than 7  Risks (bleeding, infection, bowel perforation that could require surgery, sedation-related changes in cardiopulmonary systems), benefits (identification and possible treatment of source of symptoms, exclusion of certain causes of symptoms), and alternatives (watchful waiting, radiographic imaging studies, empiric medical treatment)  were explained to  patient in detail and patient wishes to proceed.    LOS: 1 day   Otis Brace  MD, FACP 07/14/2019, 8:04 AM  Contact #  [phone]

## 2019-07-14 NOTE — Progress Notes (Signed)
PROGRESS NOTE    Barry Moore  [id number] DOB: [birth year] DOA: 07/13/2019 PCP: Patient, No Pcp Per    Brief Narrative:53 y.o. male with medical history significant for homelessness, alcohol dependence, probable cirrhosis, now presenting to the emergency department with abdominal pain and melena.  Patient is acutely intoxicated, limiting the history, but reports some recent abdominal pain, gesturing to the mid and upper abdomen, and reports black stool.  He denies throwing up recently.  He has history of grade 1 esophageal varices but had a normal esophagus on the EGD earlier this month.  He continues to consume alcohol and does not take his prescribed PPI.  ED Course: Upon arrival to the ED, patient is found to be afebrile, saturating well on room air, and with stable blood pressure.  Chemistry panel with normal BUN, alkaline phosphatase 181, and AST 113.  Ethanol is elevated to 351.  CBC with stable chronic microcytic anemia, hemoglobin 8.7.  Mild thrombocytopenia is also noted.  Fecal occult blood testing is positive.  Gastroenterology was consulted by the ED physician, recommended IV PPI and n.p.o. but did not feel that octreotide was warranted at this juncture.  Patient was started on IV PPI and hospitalist consulted for admission.  Assessment & Plan:   Principal Problem:   Acute upper GI bleeding Active Problems:   Thrombocytopenia (HCC)   Microcytic anemia   Alcohol dependence ([place])   #1 upper GI bleed likely secondary to variceal patient with history of alcohol abuse continues to drink alcohol till the day he came to the hospital.  Recent EGD with varices.  Plan for EGD today.  Continue Protonix.  To keep hemoglobin above 7. N.p.o. IV fluids Follow-up H&H Continue IV Protonix Prophylactic Rocephin ordered. Hemoglobin 7.8  #2 EtOH abuse-continue CIWA protocol.  Continue folate and thiamine and monitor electrolytes.  Alcohol level 351.  Potassium magnesium normal.  UDS  negative.  #3 thrombocytopenia likely related to #2 platelet count 118   Estimated body mass index is 26.27 kg/m as calculated from the following:   Height as of this encounter: 5\' 5"  (1.651 m).   Weight as of this encounter: 71.6 kg.  DVT prophylaxis: SCD  code Status: Full Family Communication: None  disposition Plan: Patient came from homeless  Discharge to shelter Barrier to discharge active GI bleed Likely will need 3 to 4 days in the hospital depending on alcohol withdrawal and GI bleed  Consultants:  GI Procedures: None Antimicrobials: Rocephin  Subjective: He is resting in bed he denied any nausea vomiting or having a bowel movement since he came to the hospital.  He is oriented to hospital he is awake and alert at this time  Objective: Vitals:   07/14/19 0225 07/14/19 0225 07/14/19 0631 07/14/19 1016  BP:  (!) 141/84 136/83 (!) 148/85  Pulse:  94 98 94  Resp:  18 16 17   Temp: 99.2 F (37.3 C) 99.2 F (37.3 C) 98.7 F (37.1 C) 99.6 F (37.6 C)  TempSrc: Oral Oral Oral Oral  SpO2:  98% 98% 97%  Weight:      Height:        Intake/Output Summary (Last 24 hours) at 07/14/2019 1218 Last data filed at 07/14/2019 0837 Gross per 24 hour  Intake 199.56 ml  Output -  Net 199.56 ml   Filed Weights   07/14/19 0223  Weight: 71.6 kg    Examination:  General exam: Appears calm and comfortable  Respiratory system: Clear to auscultation. Respiratory effort normal. Cardiovascular system:  S1 & S2 heard, RRR. No JVD, murmurs, rubs, gallops or clicks. No pedal edema. Gastrointestinal system: Abdomen is nondistended, soft and nontender. No organomegaly or masses felt. Normal bowel sounds heard. Central nervous system: Alert and oriented. No focal neurological deficits. Extremities: Mild asterixis Skin: No rashes, lesions or ulcers Psychiatry: Judgement and insight appear normal. Mood & affect appropriate.     Data Reviewed: I have personally reviewed following labs  and imaging studies  CBC: Recent Labs  Lab 07/13/19 2205 07/14/19 0350 07/14/19 1107  WBC 4.5 3.4*  --   NEUTROABS 2.1  --   --   HGB 8.7* 7.8* 7.8*  HCT 29.3* 26.3* 26.0*  MCV 76.5* 76.9*  --   PLT 128* 118*  --    Basic Metabolic Panel: Recent Labs  Lab 07/13/19 2205 07/14/19 0350  NA 140 140  K 3.8 4.0  CL 103 104  CO2 27 24  GLUCOSE 103* 88  BUN 9 11  CREATININE 0.74 0.63  CALCIUM 8.7* 8.5*  MG  --  2.3  PHOS  --  3.9   GFR: Estimated Creatinine Clearance: 91.8 mL/min (by C-G formula based on SCr of 0.63 mg/dL). Liver Function Tests: Recent Labs  Lab 07/13/19 2205 07/14/19 0350  AST 113* 96*  ALT 30 27  ALKPHOS 181* 159*  BILITOT 0.6 0.6  PROT 8.6* 7.6  ALBUMIN 3.6 3.4*   No results for input(s): LIPASE, AMYLASE in the last 168 hours. No results for input(s): AMMONIA in the last 168 hours. Coagulation Profile: Recent Labs  Lab 07/13/19 2205  INR 1.1   Cardiac Enzymes: No results for input(s): CKTOTAL, CKMB, CKMBINDEX, TROPONINI in the last 168 hours. BNP (last 3 results) No results for input(s): PROBNP in the last 8760 hours. HbA1C: No results for input(s): HGBA1C in the last 72 hours. CBG: No results for input(s): GLUCAP in the last 168 hours. Lipid Profile: No results for input(s): CHOL, HDL, LDLCALC, TRIG, CHOLHDL, LDLDIRECT in the last 72 hours. Thyroid Function Tests: No results for input(s): TSH, T4TOTAL, FREET4, T3FREE, THYROIDAB in the last 72 hours. Anemia Panel: No results for input(s): VITAMINB12, FOLATE, FERRITIN, TIBC, IRON, RETICCTPCT in the last 72 hours. Sepsis Labs: No results for input(s): PROCALCITON, LATICACIDVEN in the last 168 hours.  Recent Results (from the past 240 hour(s))  Respiratory Panel by RT PCR (Flu A&B, Covid) - Nasopharyngeal Swab     Status: None   Collection Time: 07/14/19 12:32 AM   Specimen: Nasopharyngeal Swab  Result Value Ref Range Status   SARS Coronavirus 2 by RT PCR NEGATIVE NEGATIVE Final     Comment: (NOTE) SARS-CoV-2 target nucleic acids are NOT DETECTED. The SARS-CoV-2 RNA is generally detectable in upper respiratoy specimens during the acute phase of infection. The lowest concentration of SARS-CoV-2 viral copies this assay can detect is 131 copies/mL. A negative result does not preclude SARS-Cov-2 infection and should not be used as the sole basis for treatment or other patient management decisions. A negative result may occur with  improper specimen collection/handling, submission of specimen other than nasopharyngeal swab, presence of viral mutation(s) within the areas targeted by this assay, and inadequate number of viral copies (<131 copies/mL). A negative result must be combined with clinical observations, patient history, and epidemiological information. The expected result is Negative. Fact Sheet for Patients:  PinkCheek.be Fact Sheet for Healthcare Providers:  GravelBags.it This test is not yet ap proved or cleared by the [place] FDA and  has been authorized for detection  and/or diagnosis of SARS-CoV-2 by FDA under an Emergency Use Authorization (EUA). This EUA will remain  in effect (meaning this test can be used) for the duration of the COVID-19 declaration under Section 564(b)(1) of the Act, 21 U.S.C. section 360bbb-3(b)(1), unless the authorization is terminated or revoked sooner.    Influenza A by PCR NEGATIVE NEGATIVE Final   Influenza B by PCR NEGATIVE NEGATIVE Final    Comment: (NOTE) The Xpert Xpress SARS-CoV-2/FLU/RSV assay is intended as an aid in  the diagnosis of influenza from Nasopharyngeal swab specimens and  should not be used as a sole basis for treatment. Nasal washings and  aspirates are unacceptable for Xpert Xpress SARS-CoV-2/FLU/RSV  testing. Fact Sheet for Patients: PinkCheek.be Fact Sheet for Healthcare Providers:  GravelBags.it This test is not yet approved or cleared by the [place] FDA and  has been authorized for detection and/or diagnosis of SARS-CoV-2 by  FDA under an Emergency Use Authorization (EUA). This EUA will remain  in effect (meaning this test can be used) for the duration of the  Covid-19 declaration under Section 564(b)(1) of the Act, 21  U.S.C. section 360bbb-3(b)(1), unless the authorization is  terminated or revoked. Performed at [facility], [place] [address], [place], [place] [address]          Radiology Studies: No results found.      Scheduled Meds: . folic acid  1 mg Oral Daily  . LORazepam  0-4 mg Intravenous Q6H   Followed by  . [START ON 07/16/2019] LORazepam  0-4 mg Intravenous Q12H  . multivitamin with minerals  1 tablet Oral Daily  . [START ON 07/17/2019] pantoprazole  40 mg Intravenous Q12H  . sodium chloride flush  3 mL Intravenous Q12H  . thiamine  100 mg Oral Daily   Or  . thiamine  100 mg Intravenous Daily   Continuous Infusions: . sodium chloride 90 mL/hr at 07/14/19 0527  . cefTRIAXone (ROCEPHIN)  IV 1 g (07/14/19 0532)  . pantoprozole (PROTONIX) infusion 8 mg/hr (07/14/19 0945)     LOS: 1 day     Georgette Shell, MD 07/14/2019, 12:18 PM

## 2019-07-14 NOTE — Anesthesia Preprocedure Evaluation (Addendum)
Anesthesia Evaluation  Patient identified by MRN, date of birth, ID band Patient awake    Reviewed: Allergy & Precautions, H&P , NPO status , Patient's Chart, lab work & pertinent test results  Airway Mallampati: II  TM Distance: >3 FB Neck ROM: Full    Dental no notable dental hx. (+) Edentulous Upper, Edentulous Lower, Dental Advisory Given   Pulmonary Current Smoker and Patient abstained from smoking.,    Pulmonary exam normal breath sounds clear to auscultation       Cardiovascular hypertension,  Rhythm:Regular Rate:Normal     Neuro/Psych Seizures -, Well Controlled,  Depression    GI/Hepatic PUD, (+)     substance abuse  alcohol use, Hepatitis -  Endo/Other  negative endocrine ROS  Renal/GU negative Renal ROS  negative genitourinary   Musculoskeletal   Abdominal   Peds  Hematology negative hematology ROS (+)   Anesthesia Other Findings   Reproductive/Obstetrics negative OB ROS                            Anesthesia Physical Anesthesia Plan  ASA: III  Anesthesia Plan: MAC   Post-op Pain Management:    Induction: Intravenous  PONV Risk Score and Plan: 1 and Propofol infusion  Airway Management Planned: Nasal Cannula  Additional Equipment:   Intra-op Plan:   Post-operative Plan:   Informed Consent: I have reviewed the patients History and Physical, chart, labs and discussed the procedure including the risks, benefits and alternatives for the proposed anesthesia with the patient or authorized representative who has indicated his/her understanding and acceptance.     Dental advisory given  Plan Discussed with: CRNA  Anesthesia Plan Comments:         Anesthesia Quick Evaluation

## 2019-07-14 NOTE — TOC Initial Note (Signed)
Transition of Care [facility]) - Initial/Assessment Note    Patient Details  Name: Barry Moore MRN: [id number] Date of Birth: 1965-07-17  Transition [facility]) CM/SW Contact:    [name], LCSW Phone Number: 07/14/2019, 2:02 PM  Clinical Narrative:   Patient seen in response to MD consult for substance abuse, and whom I had met with just 11 days ago when he was admitted for same symptoms.  At that time, he was not interested in any SA resources as he was not intending to quit, but was interested in a tent and a sleeping bag.  However, he left before I was able to arrange those things be delivered to him, despite knowing that I was working on getting them same day.  When seen today, Barry Moore was minimally engaged as he was NPO and c/o hunger, thirst.  Again, he denied needing SA resources, but still expressed interest in sleeping bag and tent.  I let him know that if he was still here on Monday, TOC could reach out to [facility] to see if they still have those supplies, and if we could arrange to get them to him.  For this he expressed appreciation. TOC will continue to follow during the course of hospitalization.                 Expected Discharge Plan: Home/Self Care Barriers to Discharge: No Barriers Identified   Patient Goals and CMS Choice Patient states their goals for this hospitalization and ongoing recovery are:: "I still need a tent and sleeping bag."      Expected Discharge Plan and Services Expected Discharge Plan: Home/Self Care In-house Referral: Clinical Social Work     Living arrangements for the past 2 months: Homeless                                      Prior Living Arrangements/Services Living arrangements for the past 2 months: Homeless Lives with:: Self Patient language and need for interpreter reviewed:: Yes Do you feel safe going back to the place where you live?: Yes      Need for Family Participation in Patient Care: No  (Comment) Care giver support system in place?: No (comment)   Criminal Activity/Legal Involvement Pertinent to Current Situation/Hospitalization: No - Comment as needed  Activities of Daily Living Home Assistive Devices/Equipment: None ADL Screening (condition at time of admission) Patient's cognitive ability adequate to safely complete daily activities?: Yes Is the patient deaf or have difficulty hearing?: No Does the patient have difficulty seeing, even when wearing glasses/contacts?: No Does the patient have difficulty concentrating, remembering, or making decisions?: Yes Patient able to express need for assistance with ADLs?: Yes Does the patient have difficulty dressing or bathing?: No Independently performs ADLs?: Yes (appropriate for developmental age) Does the patient have difficulty walking or climbing stairs?: Yes Weakness of Legs: Both Weakness of Arms/Hands: Both  Permission Sought/Granted                  Emotional Assessment Appearance:: Appears stated age Attitude/Demeanor/Rapport: Other (comment)(Minimally engaged) Affect (typically observed): Constricted Orientation: : Oriented to Self, Oriented to Place, Oriented to Situation Alcohol / Substance Use: Alcohol Use Psych Involvement: No (comment)  Admission diagnosis:  Acute upper GI bleeding [K92.2] Upper GI bleed [C58.5] Alcoholic intoxication without complication ([place]) [I77.824] Patient Active Problem List   Diagnosis Date Noted  . Acute upper  GI bleeding 07/02/2019  . Duodenitis   . Acute upper GI bleed 01/01/2019  . Hypokalemia 01/01/2019  . Alcohol dependence ([place]) 01/01/2019  . Alcoholic liver disease ([place]) 01/01/2019  . Acute pancreatitis 12/13/2018  . Free intraperitoneal air 12/13/2018  . Homicidal ideation   . Gastrointestinal hemorrhage with melena 08/31/2018  . Colon ulcer   . Polyp of ascending colon   . Acute GI bleeding 08/05/2018  . Transaminitis 08/05/2018  . Abdominal pain  08/05/2018  . Nausea and vomiting 08/05/2018  . Substance induced mood disorder ([place]) 07/16/2017  . Severe recurrent major depression without psychotic features ([place]) 05/15/2017  . Microcytic anemia 03/18/2017  . Acute blood loss anemia 02/21/2017  . Upper GI bleed 02/08/2017  . Alcohol withdrawal ([place]) 02/08/2017  . Cocaine abuse ([place]) 11/18/2016  . GI bleed 11/01/2016  . Alcohol abuse   . Major depressive disorder, recurrent severe without psychotic features ([place]) 07/06/2016  . 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 Seizure ([place]) 05/26/2016  . Tobacco abuse 05/26/2016  . Homeless 05/26/2016  . Essential hypertension 05/26/2016  . Alcohol dependence with alcohol-induced mood disorder ([place]) 02/14/2016  . Severe alcohol withdrawal without perceptual disturbances without complication ([place]) 13/88/7195  . Alcoholic hepatitis without ascites 02/14/2016  . Thrombocytopenia ([place]) 02/14/2016   PCP:  Patient, No Pcp Per Pharmacy:   [name] [place], [place] [address] [place] [address] Phone: [phone] Fax: [phone]     Social Determinants of Health ([place]) Interventions    Readmission Risk Interventions Readmission Risk Prevention Plan 01/03/2019 09/01/2018 08/09/2018  Transportation Screening Complete Complete Complete  PCP or Specialist Appt within 5-7 Days - - Complete  Home Care Screening - - Complete  Medication Review (RN CM) - - Complete  Medication Review (RN Care Manager) Complete Complete -  PCP or Specialist appointment within 3-5 days of discharge Not Complete Complete -  PCP/Specialist Appt Not Complete comments Got first available appointments - -  [place] or Home Care Consult Not Complete Complete -  SW Recovery Care/Counseling Consult Not Complete Complete -  Palliative Care Screening Not Applicable Not Applicable -  [place] Not Applicable Not Applicable -  Some recent  data might be hidden

## 2019-07-14 NOTE — ED Notes (Signed)
Pt has a knife locked up with security 

## 2019-07-14 NOTE — H&P (Addendum)
History and Physical    Barry Moore [id number] DOB: [birth year] DOA: 07/13/2019  PCP: Patient, No Pcp Per   Patient coming from: Home   Chief Complaint: Abdominal pain, melena   HPI: Barry Moore is a 54 y.o. male with medical history significant for homelessness, alcohol dependence, probable cirrhosis, now presenting to the emergency department with abdominal pain and melena.  Patient is acutely intoxicated, limiting the history, but reports some recent abdominal pain, gesturing to the mid and upper abdomen, and reports black stool.  He denies throwing up recently.  He has history of grade 1 esophageal varices but had a normal esophagus on the EGD earlier this month.  He continues to consume alcohol and does not take his prescribed PPI.  ED Course: Upon arrival to the ED, patient is found to be afebrile, saturating well on room air, and with stable blood pressure.  Chemistry panel with normal BUN, alkaline phosphatase 181, and AST 113.  Ethanol is elevated to 351.  CBC with stable chronic microcytic anemia, hemoglobin 8.7.  Mild thrombocytopenia is also noted.  Fecal occult blood testing is positive.  Gastroenterology was consulted by the ED physician, recommended IV PPI and n.p.o. but did not feel that octreotide was warranted at this juncture.  Patient was started on IV PPI and hospitalist consulted for admission.  Review of Systems:  Unable to complete ROS secondary the patient's clinical condition.  Past Medical History:  Diagnosis Date  . Alcoholic hepatitis   . Depression   . ETOH abuse   . Gastric bleed 08/2018  . Hypertension   . Pancreatitis   . Seizures ([place])   . Suicidal behavior     Past Surgical History:  Procedure Laterality Date  . BIOPSY  08/05/2018   Procedure: BIOPSY;  Surgeon: Rush Landmark Telford Nab., MD;  Location: [place];  Service: Gastroenterology;;  . BIOPSY  08/07/2018   Procedure: BIOPSY;  Surgeon: Thornton Park, MD;  Location: [facility] ENDOSCOPY;   Service: Gastroenterology;;  . BIOPSY  01/02/2019   Procedure: BIOPSY;  Surgeon: Gatha Mayer, MD;  Location: [facility] ENDOSCOPY;  Service: Endoscopy;;  . COLONOSCOPY WITH PROPOFOL N/A 08/07/2018   Procedure: COLONOSCOPY WITH PROPOFOL;  Surgeon: Thornton Park, MD;  Location: [place];  Service: Gastroenterology;  Laterality: N/A;  . ESOPHAGOGASTRODUODENOSCOPY N/A 01/02/2019   Procedure: ESOPHAGOGASTRODUODENOSCOPY (EGD);  Surgeon: Gatha Mayer, MD;  Location: [facility];  Service: Endoscopy;  Laterality: N/A;  . ESOPHAGOGASTRODUODENOSCOPY N/A 07/03/2019   Procedure: ESOPHAGOGASTRODUODENOSCOPY (EGD);  Surgeon: Wonda Horner, MD;  Location: [facility] ENDOSCOPY;  Service: Endoscopy;  Laterality: N/A;  . ESOPHAGOGASTRODUODENOSCOPY (EGD) WITH PROPOFOL N/A 11/02/2016   Procedure: ESOPHAGOGASTRODUODENOSCOPY (EGD) WITH PROPOFOL;  Surgeon: Carol Ada, MD;  Location: [facility] ENDOSCOPY;  Service: Endoscopy;  Laterality: N/A;  . ESOPHAGOGASTRODUODENOSCOPY (EGD) WITH PROPOFOL N/A 08/05/2018   Procedure: ESOPHAGOGASTRODUODENOSCOPY (EGD) WITH PROPOFOL;  Surgeon: Rush Landmark Telford Nab., MD;  Location: [place];  Service: Gastroenterology;  Laterality: N/A;  . HERNIA REPAIR    . LEG SURGERY    . POLYPECTOMY  08/07/2018   Procedure: POLYPECTOMY;  Surgeon: Thornton Park, MD;  Location: [place];  Service: Gastroenterology;;     reports that he has been smoking cigarettes. He has been smoking about 1.00 pack per day. He has never used smokeless tobacco. He reports current alcohol use. He reports previous drug use. Frequency: 3.00 times per week. Drug: Cocaine.  Allergies  Allergen Reactions  . Tomato Shortness Of Breath and Nausea And Vomiting  . Aspirin Nausea And Vomiting  .  Sulfa Antibiotics Other (See Comments)    unknown    Family History  Problem Relation Age of Onset  . Diabetes Mother   . Alcoholism Mother   . Emphysema Father   . Lung cancer Father   . Alcoholism Father       Prior to Admission medications   Medication Sig Start Date End Date Taking? Authorizing Provider  pantoprazole (PROTONIX) 40 MG tablet Take 1 tablet (40 mg total) by mouth daily. 07/04/19 08/03/19  Arrien, Jimmy Picket, MD  ferrous sulfate 325 (65 FE) MG tablet Take 1 tablet (325 mg total) by mouth daily with breakfast. Patient not taking: Reported on 09/23/2018 08/09/18 10/09/18  Elodia Florence., MD    Physical Exam: Vitals:   07/14/19 0145 07/14/19 0223 07/14/19 0225 07/14/19 0225  BP: 124/82   (!) 141/84  Pulse: 97   94  Resp: 15   18  Temp:   99.2 F (37.3 C) 99.2 F (37.3 C)  TempSrc:   Oral Oral  SpO2: 96%   98%  Weight:  71.6 kg    Height:  5\' 5"  (1.651 m)      Constitutional: NAD, sleeping  Eyes: PERTLA, lids and conjunctivae normal ENMT: Mucous membranes are moist. Posterior pharynx clear of any exudate or lesions.   Neck: normal, supple, no masses, no thyromegaly Respiratory: no crackles. No accessory muscle use.  Cardiovascular: S1 & S2 heard, regular rate and rhythm. No extremity edema.  Abdomen: No distension, soft, tender in epigastrium without rebound pain or guarding. Bowel sounds active.  Musculoskeletal: no clubbing / cyanosis. No joint deformity upper and lower extremities.   Skin: no significant rashes, lesions, ulcers. Warm, dry, well-perfused. Neurologic: No gross facial asymmetry. Sensation intact. Moving all extremities.  Psychiatric: Sleeping, wakes to voice. Not answering most questions.     Labs and Imaging on Admission: I have personally reviewed following labs and imaging studies  CBC: Recent Labs  Lab 07/13/19 2205  WBC 4.5  NEUTROABS 2.1  HGB 8.7*  HCT 29.3*  MCV 76.5*  PLT 0000000*   Basic Metabolic Panel: Recent Labs  Lab 07/13/19 2205  NA 140  K 3.8  CL 103  CO2 27  GLUCOSE 103*  BUN 9  CREATININE 0.74  CALCIUM 8.7*   GFR: Estimated Creatinine Clearance: 91.8 mL/min (by C-G formula based on SCr of 0.74  mg/dL). Liver Function Tests: Recent Labs  Lab 07/13/19 2205  AST 113*  ALT 30  ALKPHOS 181*  BILITOT 0.6  PROT 8.6*  ALBUMIN 3.6   No results for input(s): LIPASE, AMYLASE in the last 168 hours. No results for input(s): AMMONIA in the last 168 hours. Coagulation Profile: Recent Labs  Lab 07/13/19 2205  INR 1.1   Cardiac Enzymes: No results for input(s): CKTOTAL, CKMB, CKMBINDEX, TROPONINI in the last 168 hours. BNP (last 3 results) No results for input(s): PROBNP in the last 8760 hours. HbA1C: No results for input(s): HGBA1C in the last 72 hours. CBG: No results for input(s): GLUCAP in the last 168 hours. Lipid Profile: No results for input(s): CHOL, HDL, LDLCALC, TRIG, CHOLHDL, LDLDIRECT in the last 72 hours. Thyroid Function Tests: No results for input(s): TSH, T4TOTAL, FREET4, T3FREE, THYROIDAB in the last 72 hours. Anemia Panel: No results for input(s): VITAMINB12, FOLATE, FERRITIN, TIBC, IRON, RETICCTPCT in the last 72 hours. Urine analysis:    Component Value Date/Time   COLORURINE STRAW (A) 04/28/2019 1623   APPEARANCEUR CLEAR 04/28/2019 1623   LABSPEC 1.002 (L) 04/28/2019  1623   PHURINE 5.0 04/28/2019 1623   GLUCOSEU NEGATIVE 04/28/2019 1623   HGBUR SMALL (A) 04/28/2019 1623   BILIRUBINUR NEGATIVE 04/28/2019 1623   KETONESUR NEGATIVE 04/28/2019 1623   PROTEINUR NEGATIVE 04/28/2019 1623   NITRITE NEGATIVE 04/28/2019 1623   LEUKOCYTESUR NEGATIVE 04/28/2019 1623   Sepsis Labs: @LABRCNTIP (procalcitonin:4,lacticidven:4) ) Recent Results (from the past 240 hour(s))  Respiratory Panel by RT PCR (Flu A&B, Covid) - Nasopharyngeal Swab     Status: None   Collection Time: 07/14/19 12:32 AM   Specimen: Nasopharyngeal Swab  Result Value Ref Range Status   SARS Coronavirus 2 by RT PCR NEGATIVE NEGATIVE Final    Comment: (NOTE) SARS-CoV-2 target nucleic acids are NOT DETECTED. The SARS-CoV-2 RNA is generally detectable in upper respiratoy specimens during the  acute phase of infection. The lowest concentration of SARS-CoV-2 viral copies this assay can detect is 131 copies/mL. A negative result does not preclude SARS-Cov-2 infection and should not be used as the sole basis for treatment or other patient management decisions. A negative result may occur with  improper specimen collection/handling, submission of specimen other than nasopharyngeal swab, presence of viral mutation(s) within the areas targeted by this assay, and inadequate number of viral copies (<131 copies/mL). A negative result must be combined with clinical observations, patient history, and epidemiological information. The expected result is Negative. Fact Sheet for Patients:  PinkCheek.be Fact Sheet for Healthcare Providers:  GravelBags.it This test is not yet ap proved or cleared by the [place] FDA and  has been authorized for detection and/or diagnosis of SARS-CoV-2 by FDA under an Emergency Use Authorization (EUA). This EUA will remain  in effect (meaning this test can be used) for the duration of the COVID-19 declaration under Section 564(b)(1) of the Act, 21 U.S.C. section 360bbb-3(b)(1), unless the authorization is terminated or revoked sooner.    Influenza A by PCR NEGATIVE NEGATIVE Final   Influenza B by PCR NEGATIVE NEGATIVE Final    Comment: (NOTE) The Xpert Xpress SARS-CoV-2/FLU/RSV assay is intended as an aid in  the diagnosis of influenza from Nasopharyngeal swab specimens and  should not be used as a sole basis for treatment. Nasal washings and  aspirates are unacceptable for Xpert Xpress SARS-CoV-2/FLU/RSV  testing. Fact Sheet for Patients: PinkCheek.be Fact Sheet for Healthcare Providers: GravelBags.it This test is not yet approved or cleared by the [place] FDA and  has been authorized for detection and/or diagnosis of SARS-CoV-2  by  FDA under an Emergency Use Authorization (EUA). This EUA will remain  in effect (meaning this test can be used) for the duration of the  Covid-19 declaration under Section 564(b)(1) of the Act, 21  U.S.C. section 360bbb-3(b)(1), unless the authorization is  terminated or revoked. Performed at [facility], [place] [address] [place], [place] [address]      Radiological Exams on Admission: No results found.  Assessment/Plan  1. Upper GI bleeding; anemia  - Presents with abdominal pain and black stool, has long hx of alcoholic dependence with probable cirrhosis and grade 1 esophageal varices, duodenal ulcers, portal hypertensive gastropathy though had normal esophagus on EGD earlier this month  - He is hemodynamically stable in ED, and has initial Hgb of 8.7, similar to recent prior, FOBT is positive, BUN normal   - GI consulting and much appreciated  - Type and screen performed in ED and he was started IV PPI   - Continue IV PPI, NPO, prophylactic Rocephin, trend H&H  2. Alcohol dependence  - Patient acknowledges excessive daily EtOH use  - He is intoxicated on arrival to ED  - Supplement vitamins, monitor electrolytes, monitor with CIWA, and use Ativan as needed   3. Thrombocytopenia  - Platelets 125k on admission, higher than recent values, most likely from chronic alcohol abuse  - Keep platelets >50k while bleeding    DVT prophylaxis: SCDs Code Status: Full  Family Communication: Discussed with patient  Disposition Plan:  Patient is from: Home  Anticipated d/c is to: Home  Anticipated d/c date is: 07/16/19 Patient currently: Requiring inpatient management with specialist consultation for acute UGIB in patient with hx of esophageal varices  Consults called: GI  Admission status: Inpatient     Vianne Bulls, MD [facility] Pager: See [email or web address]  If 7AM-7PM, please contact the daytime attending [email or web address]  07/14/2019, 3:34 AM

## 2019-07-14 NOTE — Transfer of Care (Signed)
Immediate Anesthesia Transfer of Care Note  Patient: Barry Moore  Procedure(s) Performed: Procedure(s): ESOPHAGOGASTRODUODENOSCOPY (EGD) WITH PROPOFOL (N/A)  Patient Location: PACU  Anesthesia Type:MAC  Level of Consciousness:  sedated, patient cooperative and responds to stimulation  Airway & Oxygen Therapy:Patient Spontanous Breathing and Patient connected to face mask oxgen  Post-op Assessment:  Report given to PACU RN and Post -op Vital signs reviewed and stable  Post vital signs:  Reviewed and stable  Last Vitals:  Vitals:   07/14/19 1258 07/14/19 1328  BP: (!) 169/75 (!) 144/84  Pulse: 98 97  Resp: 13 (!) 21  Temp: 37.7 C 37.2 C  SpO2: 64% 38%    Complications: No apparent anesthesia complications

## 2019-07-14 NOTE — Brief Op Note (Signed)
07/13/2019 - 07/14/2019  1:34 PM  PATIENT:  Barry Moore  54 y.o. male  PRE-OPERATIVE DIAGNOSIS:  Melena  POST-OPERATIVE DIAGNOSIS:  gastritis, small esophageal varices  PROCEDURE:  Procedure(s): ESOPHAGOGASTRODUODENOSCOPY (EGD) WITH PROPOFOL (N/A)  SURGEON:  Surgeon(s) and Role:    * Jais Demir, MD - Primary  Findings ----------- -EGD showed minimal esophageal varices.  Not amenable for band ligation. -Also showed gastritis and mild duodenitis.  No evidence of active bleeding.  Recommendations ----------------------- -Start soft diet -Change IV PPI to p.o. twice daily -Monitor H&H -Hopefully discharge tomorrow -GI will follow tomorrow  Otis Brace MD, [place] 07/14/2019, 1:35 PM  Contact #  [phone]

## 2019-07-14 NOTE — Op Note (Signed)
[facility] Patient Name: Barry Moore Procedure Date: 07/14/2019 MRN: [id number] Attending MD: Otis Brace , MD Date of Birth: [birth year] CSN: [id number] Age: 54 Admit Type: Inpatient Procedure:                Upper GI endoscopy Indications:              Melena Providers:                Otis Brace, MD, Vista Lawman, RN, Theodora Blow, Technician Referring MD:              Medicines:                Sedation Administered by an Anesthesia Professional Complications:            No immediate complications. Estimated Blood Loss:     Estimated blood loss was minimal. Procedure:                Pre-Anesthesia Assessment:                           - Prior to the procedure, a History and Physical                            was performed, and patient medications and                            allergies were reviewed. The patient's tolerance of                            previous anesthesia was also reviewed. The risks                            and benefits of the procedure and the sedation                            options and risks were discussed with the patient.                            All questions were answered, and informed consent                            was obtained. Prior Anticoagulants: The patient has                            taken no previous anticoagulant or antiplatelet                            agents. ASA Grade Assessment: III - A patient with                            severe systemic disease. After reviewing the risks  and benefits, the patient was deemed in                            satisfactory condition to undergo the procedure.                           After obtaining informed consent, the endoscope was                            passed under direct vision. Throughout the                            procedure, the patient's blood pressure, pulse, and                            oxygen  saturations were monitored continuously. The                            [id number]) Olympus gastroscope was                            introduced through the mouth, and advanced to the                            second part of duodenum. The upper GI endoscopy was                            accomplished without difficulty. The patient                            tolerated the procedure well. Scope In: Scope Out: Findings:      Grade I, small (< 5 mm) varices were found in the mid esophagus.      Localized mild mucosal variance was found in the distal esophagus.      Diffuse mild inflammation characterized by congestion (edema) and       erythema was found in the gastric fundus and in the gastric body.      The cardia and gastric fundus were normal on retroflexion.      Segmental mild inflammation characterized by congestion (edema),       erythema and granularity was found in the duodenal bulb.      The first portion of the duodenum and second portion of the duodenum       were normal. Impression:               - Grade I and small (< 5 mm) esophageal varices.                           - Esophageal mucosal variant.                           - Gastritis.                           - Chronic duodenitis.                           -  Normal first portion of the duodenum and second                            portion of the duodenum.                           - No specimens collected. Moderate Sedation:      Moderate (conscious) sedation was personally administered by an       anesthesia professional. The following parameters were monitored: oxygen       saturation, heart rate, blood pressure, and response to care. Recommendation:           - Return patient to hospital ward for ongoing care.                           - Soft diet.                           - Continue present medications.                           - Return to GI clinic in 4 weeks. Procedure Code(s):        --- Professional  ---                           [phone], Esophagogastroduodenoscopy, flexible,                            transoral; diagnostic, including collection of                            specimen(s) by brushing or washing, when performed                            (separate procedure) Diagnosis Code(s):        --- Professional ---                           I85.00, Esophageal varices without bleeding                           K22.8, Other specified diseases of esophagus                           K29.70, Gastritis, unspecified, without bleeding                           K29.80, Duodenitis without bleeding                           K92.1, Melena (includes Hematochezia) CPT copyright 2019 American Medical Association. All rights reserved. The codes documented in this report are preliminary and upon coder review may  be revised to meet current compliance requirements. Otis Brace, MD Otis Brace, MD 07/14/2019 1:33:34 PM Number of Addenda: 0

## 2019-07-14 NOTE — Anesthesia Postprocedure Evaluation (Signed)
Anesthesia Post Note  Patient: Barry Moore  Procedure(s) Performed: ESOPHAGOGASTRODUODENOSCOPY (EGD) WITH PROPOFOL (N/A )     Patient location during evaluation: Endoscopy Anesthesia Type: MAC Level of consciousness: awake and alert Pain management: pain level controlled Vital Signs Assessment: post-procedure vital signs reviewed and stable Respiratory status: spontaneous breathing, nonlabored ventilation and respiratory function stable Cardiovascular status: stable and blood pressure returned to baseline Postop Assessment: no apparent nausea or vomiting Anesthetic complications: no    Last Vitals:  Vitals:   07/14/19 1340 07/14/19 1345  BP: (!) 167/82 (!) 162/77  Pulse: 91 89  Resp: (!) 21 (!) 22  Temp:    SpO2: 96% 98%    Last Pain:  Vitals:   07/14/19 1345  TempSrc:   PainSc: 0-No pain                 Izick Gasbarro,W. EDMOND

## 2019-07-15 LAB — CBC
HCT: 27.4 % — ABNORMAL LOW (ref 39.0–52.0)
Hemoglobin: 8.2 g/dL — ABNORMAL LOW (ref 13.0–17.0)
MCH: 23 pg — ABNORMAL LOW (ref 26.0–34.0)
MCHC: 29.9 g/dL — ABNORMAL LOW (ref 30.0–36.0)
MCV: 76.8 fL — ABNORMAL LOW (ref 80.0–100.0)
Platelets: 96 10*3/uL — ABNORMAL LOW (ref 150–400)
RBC: 3.57 MIL/uL — ABNORMAL LOW (ref 4.22–5.81)
RDW: 24.2 % — ABNORMAL HIGH (ref 11.5–15.5)
WBC: 4 10*3/uL (ref 4.0–10.5)
nRBC: 0 % (ref 0.0–0.2)

## 2019-07-15 LAB — COMPREHENSIVE METABOLIC PANEL
ALT: 28 U/L (ref 0–44)
AST: 95 U/L — ABNORMAL HIGH (ref 15–41)
Albumin: 3.2 g/dL — ABNORMAL LOW (ref 3.5–5.0)
Alkaline Phosphatase: 174 U/L — ABNORMAL HIGH (ref 38–126)
Anion gap: 9 (ref 5–15)
BUN: 11 mg/dL (ref 6–20)
CO2: 24 mmol/L (ref 22–32)
Calcium: 8.7 mg/dL — ABNORMAL LOW (ref 8.9–10.3)
Chloride: 102 mmol/L (ref 98–111)
Creatinine, Ser: 0.73 mg/dL (ref 0.61–1.24)
GFR calc Af Amer: >60 mL/min (ref >60)
GFR calc non Af Amer: >60 mL/min (ref >60)
Glucose, Bld: 92 mg/dL (ref 70–99)
Potassium: 3.7 mmol/L (ref 3.5–5.1)
Sodium: 135 mmol/L (ref 135–145)
Total Bilirubin: 1.7 mg/dL — ABNORMAL HIGH (ref 0.3–1.2)
Total Protein: 7.8 g/dL (ref 6.5–8.1)

## 2019-07-15 LAB — AMMONIA: Ammonia: 48 umol/L — ABNORMAL HIGH (ref 9–35)

## 2019-07-15 MED ORDER — THIAMINE HCL 100 MG PO TABS: 100.0000 mg | ORAL_TABLET | Freq: Every day | ORAL | Status: DC

## 2019-07-15 MED ORDER — ADULT MULTIVITAMIN W/MINERALS CH: 1.0000 | ORAL_TABLET | Freq: Every day | ORAL | Status: DC

## 2019-07-15 MED ORDER — AMLODIPINE BESYLATE 5 MG PO TABS
5.0000 mg | ORAL_TABLET | Freq: Every day | ORAL | 11 refills | Status: DC
Start: 2019-07-15 — End: 2019-07-20

## 2019-07-15 MED ORDER — CHLORDIAZEPOXIDE HCL 25 MG PO CAPS: 25.0000 mg | ORAL_CAPSULE | Freq: Three times a day (TID) | ORAL | 0 refills | Status: DC | PRN

## 2019-07-15 MED ORDER — PANTOPRAZOLE SODIUM 40 MG PO TBEC: 40.0000 mg | DELAYED_RELEASE_TABLET | Freq: Two times a day (BID) | ORAL | 2 refills | Status: DC

## 2019-07-15 MED ORDER — CHLORDIAZEPOXIDE HCL 25 MG PO CAPS: 25.0000 mg | ORAL_CAPSULE | Freq: Three times a day (TID) | ORAL | 0 refills | Status: AC | PRN

## 2019-07-15 MED ORDER — FOLIC ACID 1 MG PO TABS: 1.0000 mg | ORAL_TABLET | Freq: Every day | ORAL | Status: DC

## 2019-07-15 NOTE — Progress Notes (Signed)
[facility] Gastroenterology Progress Note  Barry Moore 54 y.o. 09/10/1965  CC: GI bleed/melena   Subjective: Patient seen and examined at bedside.  Discussed with RN.  No further bleeding episodes.  Denies abdominal pain, nausea and vomiting.  ROS : Negative for chest pain and shortness of breath.   Objective: Vital signs in last 24 hours: Vitals:   07/14/19 2023 07/15/19 0623  BP: (!) 156/78 (!) 159/90  Pulse: 88 86  Resp: 17 18  Temp: 99.2 F (37.3 C) 98.7 F (37.1 C)  SpO2: 96% 98%    Physical Exam:  General.  Resting comfortably.  Not in acute distress Abdomen.  Soft, nontender, nondistended, bowel sounds present.  No peritoneal signs Mood and affect normal Alert and oriented x3.   Lab Results: Recent Labs    07/14/19 0350 07/15/19 0514  NA 140 135  K 4.0 3.7  CL 104 102  CO2 24 24  GLUCOSE 88 92  BUN 11 11  CREATININE 0.63 0.73  CALCIUM 8.5* 8.7*  MG 2.3  --   PHOS 3.9  --    Recent Labs    07/14/19 0350 07/15/19 0514  AST 96* 95*  ALT 27 28  ALKPHOS 159* 174*  BILITOT 0.6 1.7*  PROT 7.6 7.8  ALBUMIN 3.4* 3.2*   Recent Labs    07/13/19 2205 07/13/19 2205 07/14/19 0350 07/14/19 1107 07/14/19 1911 07/15/19 0514  WBC 4.5   < > 3.4*  --   --  4.0  NEUTROABS 2.1  --   --   --   --   --   HGB 8.7*   < > 7.8*   < > 7.8* 8.2*  HCT 29.3*   < > 26.3*   < > 26.3* 27.4*  MCV 76.5*   < > 76.9*  --   --  76.8*  PLT 128*   < > 118*  --   --  96*   < > = values in this interval not displayed.   Recent Labs    07/13/19 2205  LABPROT 13.7  INR 1.1      Assessment/Plan: -Recurrent GI bleed with melena.  EGD yesterday showed small nonbleeding esophageal varices and gastritis.  No evidence of active bleeding. -Acute blood loss anemia.  Hemoglobin stable -Alcohol use -ongoing -Thrombocytopenia.  Probably from alcohol use/cirrhosis -Abnormal LFTs.  Mild elevated AST and alkaline phosphatase.  Normal ALT and T bili.    Recommendations ------------------------- -Hemoglobin stable.  No further bleeding episodes. -Possibility of underlying cirrhosis has been discussed with the patient based on evidence of low platelet count and esophageal varices. -Absolute alcohol abstinence discussed.  -Recommend follow-up with Mansouraty, Valarie Merino ,MD after discharge in 4 to 6 weeks.  -GI will sign off.  Call us back if needed   Otis Brace MD, [place] 07/15/2019, 10:46 AM  Contact #  [phone]

## 2019-07-15 NOTE — TOC Progression Note (Addendum)
Transition of Care [facility]) - Progression Note    Patient Details  Name: Barry Moore MRN: [id number] Date of Birth: 04/03/1965  Transition of Care [facility] CM/SW Contact  Joaquin Courts, RN Phone Number: 07/15/2019, 12:26 PM  Clinical Narrative:  CM noted patients interest in tent/sleeping bag from  [employer] [facility], however the organization is closed on weekends and CM is unable to get the supplies arranged for patient as a result.     CM did provide patient with 2 bus passes for discharge.   Expected Discharge Plan: Home/Self Care Barriers to Discharge: No Barriers Identified  Expected Discharge Plan and Services Expected Discharge Plan: Home/Self Care In-house Referral: Clinical Social Work     Living arrangements for the past 2 months: Homeless Expected Discharge Date: 07/15/19                                     Social Determinants of Health (SDOH) Interventions    Readmission Risk Interventions Readmission Risk Prevention Plan 01/03/2019 09/01/2018 08/09/2018  Transportation Screening Complete Complete Complete  PCP or Specialist Appt within 5-7 Days - - Complete  Home Care Screening - - Complete  Medication Review (RN CM) - - Complete  Medication Review Press photographer) Complete Complete -  PCP or Specialist appointment within 3-5 days of discharge Not Complete Complete -  PCP/Specialist Appt Not Complete comments Got first available appointments - -  [place] or Home Care Consult Not Complete Complete -  SW Recovery Care/Counseling Consult Not Complete Complete -  Palliative Care Screening Not Applicable Not Applicable -  [place] Not Applicable Not Applicable -  Some recent data might be hidden

## 2019-07-15 NOTE — Discharge Summary (Signed)
Physician Discharge Summary  Barry Moore [id number] DOB: May 21, 1965 DOA: 07/13/2019  PCP: Patient, No Pcp Per  Admit date: 07/13/2019 Discharge date: 07/15/2019  Admitted From: Homeless Disposition: Homeless patient did not want to wait for a tent and a sleeping bag  recommendations for Outpatient Follow-up:  1. Follow up with PCP in 1-2 weeks 2. Please obtain BMP/CBC in one week 3. Please follow up with dr Rush Landmark  Home Health: None Equipment/Devices: None  Discharge Condition: Stable CODE STATUS: Full code Diet recommendation: Regular Brief/Interim Summary:54 y.o.malewith medical history significant forhomelessness, alcohol dependence, probable cirrhosis, now presenting to the emergency department with abdominal pain and melena. Patient is acutely intoxicated, limiting the history, but reports some recent abdominal pain, gesturing to the mid and upper abdomen, and reports black stool. He denies throwing up recently. He has history of grade 1 esophageal varices but had a normal esophagus on the EGD earlier this month. He continues to consume alcohol and does not take his prescribed PPI.  ED Course:Upon arrival to the ED, patient is found to be afebrile, saturating well on room air, and with stable blood pressure. Chemistry panel with normal BUN, alkaline phosphatase 181, and AST 113. Ethanol is elevated to 351. CBC with stable chronic microcytic anemia, hemoglobin 8.7. Mild thrombocytopenia is also noted. Fecal occult blood testing is positive. Gastroenterology was consulted by the ED physician, recommended IV PPI and n.p.o. but did not feel that octreotide was warranted at this juncture. Patient was started on IV PPI and hospitalist consulted for admission  Discharge Diagnoses:  Principal Problem:   Acute upper GI bleeding Active Problems:   Thrombocytopenia (HCC)   Microcytic anemia   Alcohol dependence ([place])   #1 upper GI bleed likely secondary to variceal  patient with history of alcohol abuse continues to drink alcohol daily basis.  His hemoglobin remained stable without transfusion.  EGD grade 1 esophageal varices, gastritis, chronic duodenitis.  Continue Protonix.  Follow-up with GI Dr. Shaune Spittle already in 4 weeks. #2 EtOH abuse-treated with CIWA protocol.  We will discharge him on Librium.  His alcohol level was 351.  He was requesting to go home.  He was awake and alert and anxious to go home.   #3 thrombocytopenia likely related to #2 platelet count 118 #4 hypertension started him on Norvasc 5 mg daily.  Estimated body mass index is 26.27 kg/m as calculated from the following:   Height as of this encounter: 5\' 5"  (1.651 m).   Weight as of this encounter: 71.6 kg.  Discharge Instructions  Discharge Instructions    Diet - low sodium heart healthy   Complete by: As directed    Increase activity slowly   Complete by: As directed      Allergies as of 07/15/2019      Reactions   Tomato Shortness Of Breath, Nausea And Vomiting   Aspirin Nausea And Vomiting   Sulfa Antibiotics Other (See Comments)   unknown      Medication List    TAKE these medications   amLODipine 5 MG tablet Commonly known as: NORVASC Take 1 tablet (5 mg total) by mouth daily.   chlordiazePOXIDE 25 MG capsule Commonly known as: LIBRIUM Take 1 capsule (25 mg total) by mouth 3 (three) times daily as needed for up to 5 days for anxiety.   folic acid 1 MG tablet Commonly known as: FOLVITE Take 1 tablet (1 mg total) by mouth daily. Start taking on: July 16, 2019   multivitamin with minerals Tabs tablet  Take 1 tablet by mouth daily. Start taking on: July 16, 2019   pantoprazole 40 MG tablet Commonly known as: PROTONIX Take 1 tablet (40 mg total) by mouth 2 (two) times daily. What changed: when to take this   thiamine 100 MG tablet Take 1 tablet (100 mg total) by mouth daily. Start taking on: July 16, 2019      Follow-up Information    Mansouraty,  Telford Nab., MD Follow up.   Specialties: Gastroenterology, Internal Medicine Contact information: [address] [address] [phone]          Allergies  Allergen Reactions  . Tomato Shortness Of Breath and Nausea And Vomiting  . Aspirin Nausea And Vomiting  . Sulfa Antibiotics Other (See Comments)    unknown    Consultations: gi  Procedures/Studies: CT HEAD WO CONTRAST  Result Date: 07/02/2019 CLINICAL DATA:  Altered mental status EXAM: CT HEAD WITHOUT CONTRAST TECHNIQUE: Contiguous axial images were obtained from the base of the skull through the vertex without intravenous contrast. COMPARISON:  06/21/2019 head CT FINDINGS: Brain: No acute territorial infarction, hemorrhage, or intracranial mass. Moderate atrophy somewhat advanced for age. Mild hypodensity in the white matter consistent with chronic small vessel ischemic change. Stable ventricle size Vascular: No hyperdense vessels.  Carotid vascular calcification Skull: Normal. Negative for fracture or focal lesion. Sinuses/Orbits: Mucosal thickening in the sinuses Other: None IMPRESSION: 1. No CT evidence for acute intracranial abnormality. 2. Advanced for age atrophy. Mild chronic small vessel ischemic change of the white matter Electronically Signed   By: Donavan Foil M.D.   On: 07/02/2019 01:32   CT Head Wo Contrast  Result Date: 06/21/2019 CLINICAL DATA:  Headache, possible seizure, intoxication EXAM: CT HEAD WITHOUT CONTRAST TECHNIQUE: Contiguous axial images were obtained from the base of the skull through the vertex without intravenous contrast. COMPARISON:  12/13/2018 FINDINGS: Brain: No acute infarct or hemorrhage. Lateral ventricles and midline structures are unremarkable. No acute extra-axial fluid collections. No mass effect. Vascular: No hyperdense vessel or unexpected calcification. Skull: Normal. Negative for fracture or focal lesion. Sinuses/Orbits: No acute finding. Other: None IMPRESSION: 1. No  acute intracranial process. Electronically Signed   By: Randa Ngo M.D.   On: 06/21/2019 18:07   CT CHEST W CONTRAST  Result Date: 07/02/2019 CLINICAL DATA:  Status post trauma. EXAM: CT CHEST, ABDOMEN, AND PELVIS WITH CONTRAST TECHNIQUE: Multidetector CT imaging of the chest, abdomen and pelvis was performed following the standard protocol during bolus administration of intravenous contrast. CONTRAST:  17mL OMNIPAQUE IOHEXOL 300 MG/ML  SOLN COMPARISON:  April 28, 2019 FINDINGS: CT CHEST FINDINGS Cardiovascular: No significant vascular findings. Normal heart size. No pericardial effusion. Moderate to marked severity coronary artery calcification is seen. Mediastinum/Nodes: No enlarged mediastinal, hilar, or axillary lymph nodes. Thyroid gland, trachea, and esophagus demonstrate no significant findings. Lungs/Pleura: Small patchy areas of low attenuation are seen within the anterolateral aspect of the right upper lobe. The largest area measures approximately 1.7 cm x 1.5 cm, while the adjacent areas measure less than 1 cm. There is no evidence of a pleural effusion or pneumothorax. Musculoskeletal: A chronic fracture deformity is seen involving the mid left clavicle. CT ABDOMEN PELVIS FINDINGS Hepatobiliary: No focal liver abnormality is seen. No gallstones, gallbladder wall thickening, or biliary dilatation. Pancreas: Unremarkable. No pancreatic ductal dilatation or surrounding inflammatory changes. Spleen: Normal in size without focal abnormality. Adrenals/Urinary Tract: Adrenal glands are unremarkable. Kidneys are normal, without renal calculi, focal lesion, or hydronephrosis. Bladder is unremarkable.  Stomach/Bowel: Stomach is within normal limits. Appendix appears normal. No evidence of bowel wall thickening, distention, or inflammatory changes. Vascular/Lymphatic: Moderate severity aortic calcification. No enlarged abdominal or pelvic lymph nodes. Reproductive: Prostate is unremarkable. Other: Very  mild nonspecific mesenteric inflammatory fat stranding is seen within the mid right abdomen. This is a adjacent to the anteromedial aspect of the right kidney and posterior to the hepatic flexure. This is seen on the prior study and is decreased in severity on the current exam. Musculoskeletal: No acute or significant osseous findings. IMPRESSION: 1. Small patchy areas of low attenuation within the anterolateral aspect of the right upper lobe, as described above. While these may represent areas of focal atelectasis, sequelae associated with pulmonary contusion cannot be excluded. 2. Very mild, chronic nonspecific mesenteric inflammatory fat stranding within the mid right abdomen. This is decreased in severity when compared to the prior study. 3. Moderate to marked severity coronary artery calcification. 4. Moderate severity aortic calcification. 5. Chronic fracture deformity involving the mid left clavicle. Aortic Atherosclerosis (ICD10-I70.0). Electronically Signed   By: Virgina Norfolk M.D.   On: 07/02/2019 01:42   CT Cervical Spine Wo Contrast  Result Date: 06/21/2019 CLINICAL DATA:  Seizure, fell, intoxicated EXAM: CT CERVICAL SPINE WITHOUT CONTRAST TECHNIQUE: Multidetector CT imaging of the cervical spine was performed without intravenous contrast. Multiplanar CT image reconstructions were also generated. COMPARISON:  12/13/2018 FINDINGS: Alignment: Alignment is grossly anatomic. Skull base and vertebrae: No acute displaced fractures. Soft tissues and spinal canal: No prevertebral fluid or swelling. No visible canal hematoma. Disc levels: Minimal spondylosis at C4/C5 with anterior osteophyte formation. No significant bony encroachment upon the central canal or neural foramina. Upper chest: There is a nonunion of a prior left clavicular fracture. Airway is patent. Lung apices are clear. Other: Reconstructed images demonstrate no additional findings. IMPRESSION: 1. No acute cervical spine fracture.  Electronically Signed   By: Randa Ngo M.D.   On: 06/21/2019 18:12   CT ABDOMEN PELVIS W CONTRAST  Result Date: 07/02/2019 CLINICAL DATA:  Status post trauma. EXAM: CT CHEST, ABDOMEN, AND PELVIS WITH CONTRAST TECHNIQUE: Multidetector CT imaging of the chest, abdomen and pelvis was performed following the standard protocol during bolus administration of intravenous contrast. CONTRAST:  165mL OMNIPAQUE IOHEXOL 300 MG/ML  SOLN COMPARISON:  April 28, 2019 FINDINGS: CT CHEST FINDINGS Cardiovascular: No significant vascular findings. Normal heart size. No pericardial effusion. Moderate to marked severity coronary artery calcification is seen. Mediastinum/Nodes: No enlarged mediastinal, hilar, or axillary lymph nodes. Thyroid gland, trachea, and esophagus demonstrate no significant findings. Lungs/Pleura: Small patchy areas of low attenuation are seen within the anterolateral aspect of the right upper lobe. The largest area measures approximately 1.7 cm x 1.5 cm, while the adjacent areas measure less than 1 cm. There is no evidence of a pleural effusion or pneumothorax. Musculoskeletal: A chronic fracture deformity is seen involving the mid left clavicle. CT ABDOMEN PELVIS FINDINGS Hepatobiliary: No focal liver abnormality is seen. No gallstones, gallbladder wall thickening, or biliary dilatation. Pancreas: Unremarkable. No pancreatic ductal dilatation or surrounding inflammatory changes. Spleen: Normal in size without focal abnormality. Adrenals/Urinary Tract: Adrenal glands are unremarkable. Kidneys are normal, without renal calculi, focal lesion, or hydronephrosis. Bladder is unremarkable. Stomach/Bowel: Stomach is within normal limits. Appendix appears normal. No evidence of bowel wall thickening, distention, or inflammatory changes. Vascular/Lymphatic: Moderate severity aortic calcification. No enlarged abdominal or pelvic lymph nodes. Reproductive: Prostate is unremarkable. Other: Very mild nonspecific  mesenteric inflammatory fat stranding is seen within the  mid right abdomen. This is a adjacent to the anteromedial aspect of the right kidney and posterior to the hepatic flexure. This is seen on the prior study and is decreased in severity on the current exam. Musculoskeletal: No acute or significant osseous findings. IMPRESSION: 1. Small patchy areas of low attenuation within the anterolateral aspect of the right upper lobe, as described above. While these may represent areas of focal atelectasis, sequelae associated with pulmonary contusion cannot be excluded. 2. Very mild, chronic nonspecific mesenteric inflammatory fat stranding within the mid right abdomen. This is decreased in severity when compared to the prior study. 3. Moderate to marked severity coronary artery calcification. 4. Moderate severity aortic calcification. 5. Chronic fracture deformity involving the mid left clavicle. Electronically Signed   By: Virgina Norfolk M.D.   On: 07/02/2019 01:43    (Echo, Carotid, EGD, Colonoscopy, ERCP)    Subjective: Resting in bed awake alert  Discharge Exam: Vitals:   07/14/19 2023 07/15/19 0623  BP: (!) 156/78 (!) 159/90  Pulse: 88 86  Resp: 17 18  Temp: 99.2 F (37.3 C) 98.7 F (37.1 C)  SpO2: 96% 98%   Vitals:   07/14/19 1633 07/14/19 1737 07/14/19 2023 07/15/19 0623  BP: (!) 166/76 (!) 158/85 (!) 156/78 (!) 159/90  Pulse: 97 95 88 86  Resp:   17 18  Temp:   99.2 F (37.3 C) 98.7 F (37.1 C)  TempSrc:   Oral Oral  SpO2:   96% 98%  Weight:      Height:        General: Pt is alert, awake, not in acute distress Cardiovascular: RRR, S1/S2 +, no rubs, no gallops Respiratory: CTA bilaterally, no wheezing, no rhonchi Abdominal: Soft, NT, ND, bowel sounds + Extremities: no edema, no cyanosis,tremors    The results of significant diagnostics from this hospitalization (including imaging, microbiology, ancillary and laboratory) are listed below for reference.      Microbiology: Recent Results (from the past 240 hour(s))  Respiratory Panel by RT PCR (Flu A&B, Covid) - Nasopharyngeal Swab     Status: None   Collection Time: 07/14/19 12:32 AM   Specimen: Nasopharyngeal Swab  Result Value Ref Range Status   SARS Coronavirus 2 by RT PCR NEGATIVE NEGATIVE Final    Comment: (NOTE) SARS-CoV-2 target nucleic acids are NOT DETECTED. The SARS-CoV-2 RNA is generally detectable in upper respiratoy specimens during the acute phase of infection. The lowest concentration of SARS-CoV-2 viral copies this assay can detect is 131 copies/mL. A negative result does not preclude SARS-Cov-2 infection and should not be used as the sole basis for treatment or other patient management decisions. A negative result may occur with  improper specimen collection/handling, submission of specimen other than nasopharyngeal swab, presence of viral mutation(s) within the areas targeted by this assay, and inadequate number of viral copies (<131 copies/mL). A negative result must be combined with clinical observations, patient history, and epidemiological information. The expected result is Negative. Fact Sheet for Patients:  PinkCheek.be Fact Sheet for Healthcare Providers:  GravelBags.it This test is not yet ap proved or cleared by the [place] FDA and  has been authorized for detection and/or diagnosis of SARS-CoV-2 by FDA under an Emergency Use Authorization (EUA). This EUA will remain  in effect (meaning this test can be used) for the duration of the COVID-19 declaration under Section 564(b)(1) of the Act, 21 U.S.C. section 360bbb-3(b)(1), unless the authorization is terminated or revoked sooner.    Influenza A by PCR  NEGATIVE NEGATIVE Final   Influenza B by PCR NEGATIVE NEGATIVE Final    Comment: (NOTE) The Xpert Xpress SARS-CoV-2/FLU/RSV assay is intended as an aid in  the diagnosis of influenza from  Nasopharyngeal swab specimens and  should not be used as a sole basis for treatment. Nasal washings and  aspirates are unacceptable for Xpert Xpress SARS-CoV-2/FLU/RSV  testing. Fact Sheet for Patients: PinkCheek.be Fact Sheet for Healthcare Providers: GravelBags.it This test is not yet approved or cleared by the [place] FDA and  has been authorized for detection and/or diagnosis of SARS-CoV-2 by  FDA under an Emergency Use Authorization (EUA). This EUA will remain  in effect (meaning this test can be used) for the duration of the  Covid-19 declaration under Section 564(b)(1) of the Act, 21  U.S.C. section 360bbb-3(b)(1), unless the authorization is  terminated or revoked. Performed at [facility], [place] [address], [place], [place] [address]      Labs: BNP (last 3 results) No results for input(s): BNP in the last 8760 hours. Basic Metabolic Panel: Recent Labs  Lab 07/13/19 2205 07/14/19 0350 07/15/19 0514  NA 140 140 135  K 3.8 4.0 3.7  CL 103 104 102  CO2 27 24 24   GLUCOSE 103* 88 92  BUN 9 11 11   CREATININE 0.74 0.63 0.73  CALCIUM 8.7* 8.5* 8.7*  MG  --  2.3  --   PHOS  --  3.9  --    Liver Function Tests: Recent Labs  Lab 07/13/19 2205 07/14/19 0350 07/15/19 0514  AST 113* 96* 95*  ALT 30 27 28   ALKPHOS 181* 159* 174*  BILITOT 0.6 0.6 1.7*  PROT 8.6* 7.6 7.8  ALBUMIN 3.6 3.4* 3.2*   No results for input(s): LIPASE, AMYLASE in the last 168 hours. Recent Labs  Lab 07/15/19 0514  AMMONIA 48*   CBC: Recent Labs  Lab 07/13/19 2205 07/14/19 0350 07/14/19 1107 07/14/19 1911 07/15/19 0514  WBC 4.5 3.4*  --   --  4.0  NEUTROABS 2.1  --   --   --   --   HGB 8.7* 7.8* 7.8* 7.8* 8.2*  HCT 29.3* 26.3* 26.0* 26.3* 27.4*  MCV 76.5* 76.9*  --   --  76.8*  PLT 128* 118*  --   --  96*   Cardiac Enzymes: No results for input(s): CKTOTAL, CKMB, CKMBINDEX, TROPONINI in the last  168 hours. BNP: Invalid input(s): POCBNP CBG: No results for input(s): GLUCAP in the last 168 hours. D-Dimer No results for input(s): DDIMER in the last 72 hours. Hgb A1c No results for input(s): HGBA1C in the last 72 hours. Lipid Profile No results for input(s): CHOL, HDL, LDLCALC, TRIG, CHOLHDL, LDLDIRECT in the last 72 hours. Thyroid function studies No results for input(s): TSH, T4TOTAL, T3FREE, THYROIDAB in the last 72 hours.  Invalid input(s): FREET3 Anemia work up No results for input(s): VITAMINB12, FOLATE, FERRITIN, TIBC, IRON, RETICCTPCT in the last 72 hours. Urinalysis    Component Value Date/Time   COLORURINE STRAW (A) 04/28/2019 1623   APPEARANCEUR CLEAR 04/28/2019 1623   LABSPEC 1.002 (L) 04/28/2019 1623   PHURINE 5.0 04/28/2019 1623   GLUCOSEU NEGATIVE 04/28/2019 1623   HGBUR SMALL (A) 04/28/2019 1623   BILIRUBINUR NEGATIVE 04/28/2019 1623   KETONESUR NEGATIVE 04/28/2019 1623   PROTEINUR NEGATIVE 04/28/2019 1623   NITRITE NEGATIVE 04/28/2019 1623   LEUKOCYTESUR NEGATIVE 04/28/2019 1623   Sepsis Labs Invalid input(s): PROCALCITONIN,  WBC,  LACTICIDVEN Microbiology Recent Results (from the  past 240 hour(s))  Respiratory Panel by RT PCR (Flu A&B, Covid) - Nasopharyngeal Swab     Status: None   Collection Time: 07/14/19 12:32 AM   Specimen: Nasopharyngeal Swab  Result Value Ref Range Status   SARS Coronavirus 2 by RT PCR NEGATIVE NEGATIVE Final    Comment: (NOTE) SARS-CoV-2 target nucleic acids are NOT DETECTED. The SARS-CoV-2 RNA is generally detectable in upper respiratoy specimens during the acute phase of infection. The lowest concentration of SARS-CoV-2 viral copies this assay can detect is 131 copies/mL. A negative result does not preclude SARS-Cov-2 infection and should not be used as the sole basis for treatment or other patient management decisions. A negative result may occur with  improper specimen collection/handling, submission of specimen  other than nasopharyngeal swab, presence of viral mutation(s) within the areas targeted by this assay, and inadequate number of viral copies (<131 copies/mL). A negative result must be combined with clinical observations, patient history, and epidemiological information. The expected result is Negative. Fact Sheet for Patients:  PinkCheek.be Fact Sheet for Healthcare Providers:  GravelBags.it This test is not yet ap proved or cleared by the [place] FDA and  has been authorized for detection and/or diagnosis of SARS-CoV-2 by FDA under an Emergency Use Authorization (EUA). This EUA will remain  in effect (meaning this test can be used) for the duration of the COVID-19 declaration under Section 564(b)(1) of the Act, 21 U.S.C. section 360bbb-3(b)(1), unless the authorization is terminated or revoked sooner.    Influenza A by PCR NEGATIVE NEGATIVE Final   Influenza B by PCR NEGATIVE NEGATIVE Final    Comment: (NOTE) The Xpert Xpress SARS-CoV-2/FLU/RSV assay is intended as an aid in  the diagnosis of influenza from Nasopharyngeal swab specimens and  should not be used as a sole basis for treatment. Nasal washings and  aspirates are unacceptable for Xpert Xpress SARS-CoV-2/FLU/RSV  testing. Fact Sheet for Patients: PinkCheek.be Fact Sheet for Healthcare Providers: GravelBags.it This test is not yet approved or cleared by the [place] FDA and  has been authorized for detection and/or diagnosis of SARS-CoV-2 by  FDA under an Emergency Use Authorization (EUA). This EUA will remain  in effect (meaning this test can be used) for the duration of the  Covid-19 declaration under Section 564(b)(1) of the Act, 21  U.S.C. section 360bbb-3(b)(1), unless the authorization is  terminated or revoked. Performed at [facility], [place] [address] [place], [place] [address]      Time coordinating discharge: 39 minutes  SIGNED:   Georgette Shell, MD  [facility] 07/15/2019, 12:09 PM Pager   If 7PM-7AM, please contact night-coverage [email or web address] Password [email or web address]

## 2019-07-16 ENCOUNTER — Encounter: Payer: Self-pay | Admitting: *Deleted

## 2019-07-18 ENCOUNTER — Other Ambulatory Visit: Payer: Self-pay

## 2019-07-18 ENCOUNTER — Encounter (HOSPITAL_COMMUNITY): Payer: Self-pay

## 2019-07-18 ENCOUNTER — Inpatient Hospital Stay (HOSPITAL_COMMUNITY)
Admission: EM | Admit: 2019-07-18 | Discharge: 2019-07-20 | DRG: 432 | Disposition: A | Discharge status: Home / Self Care | Payer: Self-pay | Attending: Family Medicine | Admitting: Family Medicine

## 2019-07-18 DIAGNOSIS — I10 Essential (primary) hypertension: Secondary | ICD-10-CM | POA: Diagnosis present

## 2019-07-18 DIAGNOSIS — Z882 Allergy status to sulfonamides status: Secondary | ICD-10-CM

## 2019-07-18 DIAGNOSIS — K7010 Alcoholic hepatitis without ascites: Secondary | ICD-10-CM | POA: Diagnosis present

## 2019-07-18 DIAGNOSIS — Z833 Family history of diabetes mellitus: Secondary | ICD-10-CM

## 2019-07-18 DIAGNOSIS — Z79899 Other long term (current) drug therapy: Secondary | ICD-10-CM

## 2019-07-18 DIAGNOSIS — I8511 Secondary esophageal varices with bleeding: Secondary | ICD-10-CM | POA: Diagnosis present

## 2019-07-18 DIAGNOSIS — Z9181 History of falling: Secondary | ICD-10-CM

## 2019-07-18 DIAGNOSIS — F10220 Alcohol dependence with intoxication, uncomplicated: Secondary | ICD-10-CM | POA: Diagnosis present

## 2019-07-18 DIAGNOSIS — Z886 Allergy status to analgesic agent status: Secondary | ICD-10-CM

## 2019-07-18 DIAGNOSIS — Z9114 Patient's other noncompliance with medication regimen: Secondary | ICD-10-CM

## 2019-07-18 DIAGNOSIS — Z825 Family history of asthma and other chronic lower respiratory diseases: Secondary | ICD-10-CM

## 2019-07-18 DIAGNOSIS — K922 Gastrointestinal hemorrhage, unspecified: Secondary | ICD-10-CM | POA: Diagnosis present

## 2019-07-18 DIAGNOSIS — Z590 Homelessness: Secondary | ICD-10-CM

## 2019-07-18 DIAGNOSIS — F339 Major depressive disorder, recurrent, unspecified: Secondary | ICD-10-CM | POA: Diagnosis present

## 2019-07-18 DIAGNOSIS — Z8659 Personal history of other mental and behavioral disorders: Secondary | ICD-10-CM

## 2019-07-18 DIAGNOSIS — I951 Orthostatic hypotension: Secondary | ICD-10-CM | POA: Diagnosis present

## 2019-07-18 DIAGNOSIS — Z801 Family history of malignant neoplasm of trachea, bronchus and lung: Secondary | ICD-10-CM

## 2019-07-18 DIAGNOSIS — F17210 Nicotine dependence, cigarettes, uncomplicated: Secondary | ICD-10-CM | POA: Diagnosis present

## 2019-07-18 DIAGNOSIS — K7030 Alcoholic cirrhosis of liver without ascites: Principal | ICD-10-CM | POA: Diagnosis present

## 2019-07-18 DIAGNOSIS — D6959 Other secondary thrombocytopenia: Secondary | ICD-10-CM | POA: Diagnosis present

## 2019-07-18 DIAGNOSIS — D696 Thrombocytopenia, unspecified: Secondary | ICD-10-CM | POA: Diagnosis present

## 2019-07-18 DIAGNOSIS — F10920 Alcohol use, unspecified with intoxication, uncomplicated: Secondary | ICD-10-CM

## 2019-07-18 DIAGNOSIS — Z20822 Contact with and (suspected) exposure to covid-19: Secondary | ICD-10-CM | POA: Diagnosis present

## 2019-07-18 DIAGNOSIS — Z811 Family history of alcohol abuse and dependence: Secondary | ICD-10-CM

## 2019-07-18 DIAGNOSIS — Y908 Blood alcohol level of 240 mg/100 ml or more: Secondary | ICD-10-CM | POA: Diagnosis present

## 2019-07-18 DIAGNOSIS — F10129 Alcohol abuse with intoxication, unspecified: Secondary | ICD-10-CM | POA: Diagnosis present

## 2019-07-18 NOTE — ED Triage Notes (Signed)
Patient brought in by [facility] due to seizure activity x3 today. Pt states he is also trying to detox from alcohol at home and has drank less then he normally would today. Pt c/o SI and depression.

## 2019-07-18 NOTE — ED Provider Notes (Signed)
[facility] DEPT Provider Note   CSN: [id number] Arrival date & time: 07/18/19  2218     History Chief Complaint  Patient presents with  . Seizures  . Alcohol Problem    Barry Moore is a 54 y.o. male with a history of alcohol use disorder, alcoholic hepatitis, GI bleed, HTN, pancreatitis, seizures who presents to the emergency department by EMS with a chief complaint of seizure-like activity x3 earlier today.  States he is unsure if these were witnessed.  He is concerned that he had a fall prior to arrival as he is endorsing pain to the top of his head, neck, and left hip.  He states that he thinks that he blacked out, but is unable to recall the specific events.  He does endorse drinking alcohol today, but is unable to quantify how much he drank.  He has continued to have melanotic stool since he was discharged on 4/25.  He denies fever, chills, nausea, vomiting, hematemesis, hematochezia, back pain, numbness, weakness, visual changes, chest pain, shortness of breath, cough.  Has a history of daily, heavy alcohol use.  Level 5 caveat secondary to altered mental status.  The history is provided by the patient. No language interpreter was used.       Past Medical History:  Diagnosis Date  . Alcoholic hepatitis   . Depression   . ETOH abuse   . Gastric bleed 08/2018  . Hypertension   . Pancreatitis   . Seizures ([place])   . Suicidal behavior     Patient Active Problem List   Diagnosis Date Noted  . Nicotine dependence, cigarettes, uncomplicated 75/12/2583  . Alcohol abuse with intoxication ([place]) 07/19/2019  . Upper GI bleeding 07/19/2019  . Acute upper GI bleeding 07/02/2019  . Duodenitis   . Acute upper GI bleed 01/01/2019  . Hypokalemia 01/01/2019  . Alcohol dependence ([place]) 01/01/2019  . Alcoholic liver disease ([place]) 01/01/2019  . Acute pancreatitis 12/13/2018  . Free intraperitoneal air 12/13/2018  . Homicidal ideation   .  Gastrointestinal hemorrhage with melena 08/31/2018  . Colon ulcer   . Polyp of ascending colon   . Acute GI bleeding 08/05/2018  . Transaminitis 08/05/2018  . Abdominal pain 08/05/2018  . Nausea and vomiting 08/05/2018  . Substance induced mood disorder ([place]) 07/16/2017  . Severe recurrent major depression without psychotic features ([place]) 05/15/2017  . Microcytic anemia 03/18/2017  . Acute blood loss anemia 02/21/2017  . Upper GI bleed 02/08/2017  . Alcohol withdrawal ([place]) 02/08/2017  . Cocaine abuse ([place]) 11/18/2016  . GI bleed 11/01/2016  . Alcohol abuse   . Major depressive disorder, recurrent severe without psychotic features ([place]) 07/06/2016  . Alcohol-induced mood disorder ([place]) 06/15/2016  . Seizure ([place]) 05/26/2016  . Tobacco abuse 05/26/2016  . Homeless 05/26/2016  . Essential hypertension 05/26/2016  . Alcohol dependence with alcohol-induced mood disorder ([place]) 02/14/2016  . Severe alcohol withdrawal without perceptual disturbances without complication ([place]) 27/78/2423  . Alcoholic hepatitis without ascites 02/14/2016  . Thrombocytopenia ([place]) 02/14/2016    Past Surgical History:  Procedure Laterality Date  . BIOPSY  08/05/2018   Procedure: BIOPSY;  Surgeon: Rush Landmark Telford Nab., MD;  Location: [facility];  Service: Gastroenterology;;  . BIOPSY  08/07/2018   Procedure: BIOPSY;  Surgeon: Thornton Park, MD;  Location: [facility];  Service: Gastroenterology;;  . BIOPSY  01/02/2019   Procedure: BIOPSY;  Surgeon: Gatha Mayer, MD;  Location: [facility];  Service: Endoscopy;;  . COLONOSCOPY WITH PROPOFOL N/A  08/07/2018   Procedure: COLONOSCOPY WITH PROPOFOL;  Surgeon: Thornton Park, MD;  Location: [facility];  Service: Gastroenterology;  Laterality: N/A;  . ESOPHAGOGASTRODUODENOSCOPY N/A 01/02/2019   Procedure: ESOPHAGOGASTRODUODENOSCOPY (EGD);  Surgeon: Gatha Mayer, MD;  Location: [facility];  Service: Endoscopy;  Laterality: N/A;  .  ESOPHAGOGASTRODUODENOSCOPY N/A 07/03/2019   Procedure: ESOPHAGOGASTRODUODENOSCOPY (EGD);  Surgeon: Wonda Horner, MD;  Location: [facility];  Service: Endoscopy;  Laterality: N/A;  . ESOPHAGOGASTRODUODENOSCOPY (EGD) WITH PROPOFOL N/A 11/02/2016   Procedure: ESOPHAGOGASTRODUODENOSCOPY (EGD) WITH PROPOFOL;  Surgeon: Carol Ada, MD;  Location: [facility];  Service: Endoscopy;  Laterality: N/A;  . ESOPHAGOGASTRODUODENOSCOPY (EGD) WITH PROPOFOL N/A 08/05/2018   Procedure: ESOPHAGOGASTRODUODENOSCOPY (EGD) WITH PROPOFOL;  Surgeon: Rush Landmark Telford Nab., MD;  Location: [facility];  Service: Gastroenterology;  Laterality: N/A;  . ESOPHAGOGASTRODUODENOSCOPY (EGD) WITH PROPOFOL N/A 07/14/2019   Procedure: ESOPHAGOGASTRODUODENOSCOPY (EGD) WITH PROPOFOL;  Surgeon: Otis Brace, MD;  Location: [facility];  Service: Gastroenterology;  Laterality: N/A;  . HERNIA REPAIR    . LEG SURGERY    . POLYPECTOMY  08/07/2018   Procedure: POLYPECTOMY;  Surgeon: Thornton Park, MD;  Location[facility];  Service: Gastroenterology;;       Family History  Problem Relation Age of Onset  . Diabetes Mother   . Alcoholism Mother   . Emphysema Father   . Lung cancer Father   . Alcoholism Father     Social History   Tobacco Use  . Smoking status: Current Every Day Smoker    Packs/day: 1.00    Types: Cigarettes  . Smokeless tobacco: Never Used  Substance Use Topics  . Alcohol use: Yes    Comment: 5+ BEERS DAILY  . Drug use: Not Currently    Frequency: 3.0 times per week    Types: Cocaine    Home Medications Prior to Admission medications   Medication Sig Start Date End Date Taking? Authorizing Provider  amLODipine (NORVASC) 5 MG tablet Take 1 tablet (5 mg total) by mouth daily. 07/15/19 07/14/20  Georgette Shell, MD  chlordiazePOXIDE (LIBRIUM) 25 MG capsule Take 1 capsule (25 mg total) by mouth 3 (three) times daily as needed for up to 5 days for anxiety. 07/15/19 07/20/19  Georgette Shell, MD  folic acid (FOLVITE) 1 MG tablet Take 1 tablet (1 mg total) by mouth daily. 07/16/19   Georgette Shell, MD  Multiple Vitamin (MULTIVITAMIN WITH MINERALS) TABS tablet Take 1 tablet by mouth daily. 07/16/19   Georgette Shell, MD  pantoprazole (PROTONIX) 40 MG tablet Take 1 tablet (40 mg total) by mouth 2 (two) times daily. 07/15/19   Georgette Shell, MD  thiamine 100 MG tablet Take 1 tablet (100 mg total) by mouth daily. 07/16/19   Georgette Shell, MD  ferrous sulfate 325 (65 FE) MG tablet Take 1 tablet (325 mg total) by mouth daily with breakfast. Patient not taking: Reported on 09/23/2018 08/09/18 10/09/18  Elodia Florence., MD    Allergies    Tomato, Aspirin, and Sulfa antibiotics  Review of Systems   Review of Systems  Unable to perform ROS: Mental status change    Physical Exam Updated Vital Signs BP (!) 148/80   Pulse 90   Temp 98.7 F (37.1 C) (Oral)   Resp 18   Ht 5' 8"  (1.727 m)   Wt 81.6 kg   SpO2 96%   BMI 27.37 kg/m   Physical Exam Vitals and nursing note reviewed.  Constitutional:  Appearance: He is well-developed.     Comments: Strong odor of ETOH.  HENT:     Head: Normocephalic. No raccoon eyes, Battle's sign, abrasion or contusion.     Jaw: There is normal jaw occlusion.     Comments: He has some diffuse tenderness to palpation to the scalp.  There is some diffuse erythema noted to the top of the scalp, but no hematoma, ecchymosis, or wounds. No tenderness to the face.    Nose: Nose normal.     Mouth/Throat:     Pharynx: Oropharynx is clear.  Eyes:     Conjunctiva/sclera: Conjunctivae normal.  Cardiovascular:     Rate and Rhythm: Normal rate and regular rhythm.     Pulses: Normal pulses.     Heart sounds: Normal heart sounds. No murmur. No friction rub. No gallop.   Pulmonary:     Effort: Pulmonary effort is normal.  Abdominal:     General: There is no distension.     Palpations: Abdomen is soft. There is no  mass.     Tenderness: There is abdominal tenderness. There is no right CVA tenderness, left CVA tenderness, guarding or rebound.     Hernia: No hernia is present.     Comments: Tenderness palpation throughout the bilateral upper abdomen.  No peritoneal signs.  Genitourinary:    Comments: Chaperone present.  Soft brown stool noted on DRE.  No external hemorrhoids noted.  Normal rectal tone. Musculoskeletal:     Cervical back: Neck supple.     Right lower leg: No edema.     Left lower leg: No edema.     Comments: Tender to palpation to the left hip.  Full active and passive range of motion, but it is painful.  No crepitus or step-offs.  Normal exam of the left knee.  Neurovascularly intact throughout the bilateral lower extremities.  Diffusely tender to palpation to the spinous processes of the cervical spine.  No crepitus or step-offs.  No tenderness to the thoracic or lumbar spinous processes.  Skin:    General: Skin is warm and dry.     Capillary Refill: Capillary refill takes less than 2 seconds.  Neurological:     Mental Status: He is alert.     Comments: Speech is not slurred.  Speaks in complete, fluent sentences.  Psychiatric:        Behavior: Behavior normal.     ED Results / Procedures / Treatments   Labs (all labs ordered are listed, but only abnormal results are displayed) Labs Reviewed  CBC WITH DIFFERENTIAL/PLATELET - Abnormal; Notable for the following components:      Result Value   RBC 3.14 (*)    Hemoglobin 7.3 (*)    HCT 24.3 (*)    MCV 77.4 (*)    MCH 23.2 (*)    RDW 24.5 (*)    Platelets 105 (*)    Neutro Abs 1.2 (*)    All other components within normal limits  COMPREHENSIVE METABOLIC PANEL - Abnormal; Notable for the following components:   Glucose, Bld 100 (*)    Calcium 8.3 (*)    Albumin 3.4 (*)    AST 105 (*)    Alkaline Phosphatase 205 (*)    All other components within normal limits  ETHANOL - Abnormal; Notable for the following components:    Alcohol, Ethyl (B) 323 (*)    All other components within normal limits  SALICYLATE LEVEL - Abnormal; Notable for the following components:  Salicylate Lvl <3.4 (*)    All other components within normal limits  ACETAMINOPHEN LEVEL - Abnormal; Notable for the following components:   Acetaminophen (Tylenol), Serum <10 (*)    All other components within normal limits  LIPASE, BLOOD - Abnormal; Notable for the following components:   Lipase 73 (*)    All other components within normal limits  POC OCCULT BLOOD, ED - Abnormal; Notable for the following components:   Fecal Occult Bld POSITIVE (*)    All other components within normal limits  RESPIRATORY PANEL BY RT PCR (FLU A&B, COVID)  PROTIME-INR  COMPREHENSIVE METABOLIC PANEL  MAGNESIUM  PHOSPHORUS  CBC  CBC  TYPE AND SCREEN    EKG None  Radiology CT Head Wo Contrast  Result Date: 07/19/2019 CLINICAL DATA:  Headache, posttraumatic, trying to detox EXAM: CT HEAD WITHOUT CONTRAST TECHNIQUE: Contiguous axial images were obtained from the base of the skull through the vertex without intravenous contrast. COMPARISON:  None. FINDINGS: Brain: No evidence of acute territorial infarction, hemorrhage, hydrocephalus,extra-axial collection or mass lesion/mass effect. There is dilatation the ventricles and sulci consistent with age-related atrophy. Low-attenuation changes in the deep white matter consistent with small vessel ischemia. Vascular: No hyperdense vessel or unexpected calcification. Skull: The skull is intact. No fracture or focal lesion identified. Sinuses/Orbits: The visualized paranasal sinuses and mastoid air cells are clear. The orbits and globes intact. Other: None Cervical spine: Alignment: There is straightening of the normal cervical lordosis. Skull base and vertebrae: Visualized skull base is intact. No atlanto-occipital dissociation. The vertebral body heights are well maintained. No fracture or pathologic osseous lesion seen.  Soft tissues and spinal canal: The visualized paraspinal soft tissues are unremarkable. No prevertebral soft tissue swelling is seen. The spinal canal is grossly unremarkable, no large epidural collection or significant canal narrowing. Disc levels: Mild cervical spine spondylosis is seen within the mid cervical spine with uncovertebral osteophytes and small anterior osteophytes most notable at C4-C5 with mild neural foraminal narrowing. Upper chest: Calcified granuloma seen at the left lung apex. Thoracic inlet is within normal limits. Other: None IMPRESSION: No acute intracranial abnormality. Findings consistent with age related atrophy and chronic small vessel ischemia No acute fracture or malalignment of the spine. Electronically Signed   By: Prudencio Pair M.D.   On: 07/19/2019 02:21   CT Cervical Spine Wo Contrast  Result Date: 07/19/2019 CLINICAL DATA:  Headache, posttraumatic, trying to detox EXAM: CT HEAD WITHOUT CONTRAST TECHNIQUE: Contiguous axial images were obtained from the base of the skull through the vertex without intravenous contrast. COMPARISON:  None. FINDINGS: Brain: No evidence of acute territorial infarction, hemorrhage, hydrocephalus,extra-axial collection or mass lesion/mass effect. There is dilatation the ventricles and sulci consistent with age-related atrophy. Low-attenuation changes in the deep white matter consistent with small vessel ischemia. Vascular: No hyperdense vessel or unexpected calcification. Skull: The skull is intact. No fracture or focal lesion identified. Sinuses/Orbits: The visualized paranasal sinuses and mastoid air cells are clear. The orbits and globes intact. Other: None Cervical spine: Alignment: There is straightening of the normal cervical lordosis. Skull base and vertebrae: Visualized skull base is intact. No atlanto-occipital dissociation. The vertebral body heights are well maintained. No fracture or pathologic osseous lesion seen. Soft tissues and spinal  canal: The visualized paraspinal soft tissues are unremarkable. No prevertebral soft tissue swelling is seen. The spinal canal is grossly unremarkable, no large epidural collection or significant canal narrowing. Disc levels: Mild cervical spine spondylosis is seen within the mid cervical spine  with uncovertebral osteophytes and small anterior osteophytes most notable at C4-C5 with mild neural foraminal narrowing. Upper chest: Calcified granuloma seen at the left lung apex. Thoracic inlet is within normal limits. Other: None IMPRESSION: No acute intracranial abnormality. Findings consistent with age related atrophy and chronic small vessel ischemia No acute fracture or malalignment of the spine. Electronically Signed   By: Prudencio Pair M.D.   On: 07/19/2019 02:21   DG Hip Unilat W or Wo Pelvis 2-3 Views Left  Result Date: 07/19/2019 CLINICAL DATA:  Left hip pain status post fall. EXAM: DG HIP (WITH OR WITHOUT PELVIS) 2-3V LEFT COMPARISON:  None. FINDINGS: There is no evidence of hip fracture or dislocation. There is no evidence of arthropathy or other focal bone abnormality. IMPRESSION: Negative. Electronically Signed   By: Virgina Norfolk M.D.   On: 07/19/2019 02:12    Procedures Procedures (including critical care time)  Medications Ordered in ED Medications  LORazepam (ATIVAN) injection 0-4 mg (0 mg Intravenous Not Given 07/19/19 0341)    Or  LORazepam (ATIVAN) tablet 0-4 mg ( Oral See Alternative 07/19/19 0341)  nicotine (NICODERM CQ - dosed in mg/24 hours) patch 21 mg (has no administration in time range)  thiamine tablet 100 mg (has no administration in time range)    Or  thiamine (B-1) injection 100 mg (has no administration in time range)  folic acid (FOLVITE) tablet 1 mg (has no administration in time range)  multivitamin with minerals tablet 1 tablet (has no administration in time range)  pantoprazole (PROTONIX) injection 40 mg (has no administration in time range)  lactated ringers  infusion (has no administration in time range)  pantoprazole (PROTONIX) 80 mg in sodium chloride 0.9 % 100 mL IVPB (0 mg Intravenous Stopped 07/19/19 0429)  ondansetron (ZOFRAN) injection 4 mg (4 mg Intravenous Given 07/19/19 0404)    ED Course  I have reviewed the triage vital signs and the nursing notes.  Pertinent labs & imaging results that were available during my care of the patient were reviewed by me and considered in my medical decision making (see chart for details).    MDM Rules/Calculators/A&P                      54 year old male with a history of alcohol use disorder, alcoholic hepatitis, GI bleed, HTN, pancreatitis, seizures who presents to the emergency department with multiple complaints.  There was concern for seizure like activity prior to arrival.  Patient has now been observed for more than 8 hours in the ER with no seizure-like activity.  Patient also is concerned that he had a fall earlier tonight with questionable loss of consciousness.  History is limited as the patient is acutely intoxicated.  Ethanol level is 323.    He believes that he became dizzy and lightheaded prior to the episode.  CT head and cervical spine are unremarkable.  He was also endorsing left hip pain, but x-ray is unremarkable.   Hemoglobin is 7.3, down ~1 gram from 4 days ago.  He does report that he has been having melanotic stool.  Hemoccult is positive.    He has also continued to drink alcohol.  He has had no vomiting that he can recall or hematemesis.  He did undergo EGD with GI on 424 that showed minimal esophageal varices that were not amenable for band ligation as well as gastritis and mild duodenitis.  There was no evidence of active bleeding.  Given patient's hemoglobin is 7.3  today and stable vital signs, no blood transfusion is needed acutely in the ER.  Although patient did not require blood transfusion during his last admission, he did receive several units earlier in the month during  a prior admission.  The patient was discussed with Dr. Randal Buba, attending physician.  Given limited history and questionable loss of consciousness that may be secondary to syncope with acute decrease in hemoglobin and continued alcohol use, the patient would benefit from admission to trend his hemoglobin.  Negative orthostatics.  The patient was started on a Protonix bolus and infusion.  Given the patient's prolonged history of daily alcohol use, he is at high risk for withdrawal.  No evidence of alcohol withdrawal on multiple reassessments of the patient.  CIWA protocol has been placed.  Alk phos is mildly elevated from previous.  Minimal change in transaminases.   Consult to the hospitalist team and Dr. Cyd Silence will accept the patient for admission as patient may require evaluation by GI given acute drop in hemoglobin. The patient appears reasonably stabilized for admission considering the current resources, flow, and capabilities available in the ED at this time, and I doubt any other [facility] requiring further screening and/or treatment in the ED prior to admission.  Final Clinical Impression(s) / ED Diagnoses Final diagnoses:  Acute alcoholic intoxication without complication [facility])  Gastrointestinal hemorrhage, unspecified gastrointestinal hemorrhage type    Rx / DC Orders ED Discharge Orders    None       Joanne Gavel, PA-C 07/19/19 0655    Palumbo, April, MD 07/19/19 9672

## 2019-07-19 ENCOUNTER — Encounter (HOSPITAL_COMMUNITY): Payer: Self-pay | Admitting: Internal Medicine

## 2019-07-19 ENCOUNTER — Emergency Department (HOSPITAL_COMMUNITY): Payer: Self-pay

## 2019-07-19 DIAGNOSIS — F17210 Nicotine dependence, cigarettes, uncomplicated: Secondary | ICD-10-CM | POA: Diagnosis present

## 2019-07-19 DIAGNOSIS — K922 Gastrointestinal hemorrhage, unspecified: Secondary | ICD-10-CM

## 2019-07-19 DIAGNOSIS — I951 Orthostatic hypotension: Secondary | ICD-10-CM | POA: Diagnosis present

## 2019-07-19 DIAGNOSIS — F10129 Alcohol abuse with intoxication, unspecified: Secondary | ICD-10-CM | POA: Diagnosis present

## 2019-07-19 LAB — CBC
HCT: 24.2 % — ABNORMAL LOW (ref 39.0–52.0)
HCT: 25.9 % — ABNORMAL LOW (ref 39.0–52.0)
Hemoglobin: 7.3 g/dL — ABNORMAL LOW (ref 13.0–17.0)
Hemoglobin: 7.8 g/dL — ABNORMAL LOW (ref 13.0–17.0)
MCH: 23.3 pg — ABNORMAL LOW (ref 26.0–34.0)
MCH: 23.1 pg — ABNORMAL LOW (ref 26.0–34.0)
MCHC: 30.2 g/dL (ref 30.0–36.0)
MCHC: 30.1 g/dL (ref 30.0–36.0)
MCV: 77.3 fL — ABNORMAL LOW (ref 80.0–100.0)
MCV: 76.9 fL — ABNORMAL LOW (ref 80.0–100.0)
Platelets: 92 10*3/uL — ABNORMAL LOW (ref 150–400)
Platelets: 90 10*3/uL — ABNORMAL LOW (ref 150–400)
RBC: 3.13 MIL/uL — ABNORMAL LOW (ref 4.22–5.81)
RBC: 3.37 MIL/uL — ABNORMAL LOW (ref 4.22–5.81)
RDW: 24.4 % — ABNORMAL HIGH (ref 11.5–15.5)
RDW: 24.3 % — ABNORMAL HIGH (ref 11.5–15.5)
WBC: 3.2 10*3/uL — ABNORMAL LOW (ref 4.0–10.5)
WBC: 3.3 10*3/uL — ABNORMAL LOW (ref 4.0–10.5)
nRBC: 0 % (ref 0.0–0.2)
nRBC: 0 % (ref 0.0–0.2)

## 2019-07-19 LAB — COMPREHENSIVE METABOLIC PANEL WITH GFR
ALT: 29 U/L (ref 0–44)
AST: 105 U/L — ABNORMAL HIGH (ref 15–41)
Albumin: 3.4 g/dL — ABNORMAL LOW (ref 3.5–5.0)
Alkaline Phosphatase: 205 U/L — ABNORMAL HIGH (ref 38–126)
Anion gap: 8 (ref 5–15)
BUN: 7 mg/dL (ref 6–20)
CO2: 25 mmol/L (ref 22–32)
Calcium: 8.3 mg/dL — ABNORMAL LOW (ref 8.9–10.3)
Chloride: 104 mmol/L (ref 98–111)
Creatinine, Ser: 0.65 mg/dL (ref 0.61–1.24)
GFR calc Af Amer: >60 mL/min
GFR calc non Af Amer: >60 mL/min
Glucose, Bld: 100 mg/dL — ABNORMAL HIGH (ref 70–99)
Potassium: 3.7 mmol/L (ref 3.5–5.1)
Sodium: 137 mmol/L (ref 135–145)
Total Bilirubin: 0.6 mg/dL (ref 0.3–1.2)
Total Protein: 8 g/dL (ref 6.5–8.1)

## 2019-07-19 LAB — COMPREHENSIVE METABOLIC PANEL
ALT: 29 U/L (ref 0–44)
AST: 88 U/L — ABNORMAL HIGH (ref 15–41)
Albumin: 3.4 g/dL — ABNORMAL LOW (ref 3.5–5.0)
Alkaline Phosphatase: 176 U/L — ABNORMAL HIGH (ref 38–126)
Anion gap: 10 (ref 5–15)
BUN: 8 mg/dL (ref 6–20)
CO2: 24 mmol/L (ref 22–32)
Calcium: 8.2 mg/dL — ABNORMAL LOW (ref 8.9–10.3)
Chloride: 104 mmol/L (ref 98–111)
Creatinine, Ser: 0.65 mg/dL (ref 0.61–1.24)
GFR calc Af Amer: >60 mL/min (ref >60)
GFR calc non Af Amer: >60 mL/min (ref >60)
Glucose, Bld: 89 mg/dL (ref 70–99)
Potassium: 3.6 mmol/L (ref 3.5–5.1)
Sodium: 138 mmol/L (ref 135–145)
Total Bilirubin: 0.9 mg/dL (ref 0.3–1.2)
Total Protein: 7.7 g/dL (ref 6.5–8.1)

## 2019-07-19 LAB — CBC WITH DIFFERENTIAL/PLATELET
Abs Immature Granulocytes: 0.01 10*3/uL (ref 0.00–0.07)
Basophils Absolute: 0.1 10*3/uL (ref 0.0–0.1)
Basophils Relative: 2 %
Eosinophils Absolute: 0.1 10*3/uL (ref 0.0–0.5)
Eosinophils Relative: 4 %
HCT: 24.3 % — ABNORMAL LOW (ref 39.0–52.0)
Hemoglobin: 7.3 g/dL — ABNORMAL LOW (ref 13.0–17.0)
Immature Granulocytes: 0 %
Lymphocytes Relative: 44 %
Lymphs Abs: 1.8 10*3/uL (ref 0.7–4.0)
MCH: 23.2 pg — ABNORMAL LOW (ref 26.0–34.0)
MCHC: 30 g/dL (ref 30.0–36.0)
MCV: 77.4 fL — ABNORMAL LOW (ref 80.0–100.0)
Monocytes Absolute: 0.8 10*3/uL (ref 0.1–1.0)
Monocytes Relative: 19 %
Neutro Abs: 1.2 10*3/uL — ABNORMAL LOW (ref 1.7–7.7)
Neutrophils Relative %: 31 %
Platelets: 105 10*3/uL — ABNORMAL LOW (ref 150–400)
RBC: 3.14 MIL/uL — ABNORMAL LOW (ref 4.22–5.81)
RDW: 24.5 % — ABNORMAL HIGH (ref 11.5–15.5)
WBC: 4 10*3/uL (ref 4.0–10.5)
nRBC: 0 % (ref 0.0–0.2)

## 2019-07-19 LAB — PHOSPHORUS: Phosphorus: 3.8 mg/dL (ref 2.5–4.6)

## 2019-07-19 LAB — POC OCCULT BLOOD, ED: Fecal Occult Bld: POSITIVE — AB

## 2019-07-19 LAB — PROTIME-INR
INR: 1.1 (ref 0.8–1.2)
Prothrombin Time: 13.5 s (ref 11.4–15.2)

## 2019-07-19 LAB — ETHANOL: Alcohol, Ethyl (B): 323 mg/dL

## 2019-07-19 LAB — MAGNESIUM: Magnesium: 2 mg/dL (ref 1.7–2.4)

## 2019-07-19 LAB — SALICYLATE LEVEL: Salicylate Lvl: <7 mg/dL — ABNORMAL LOW (ref 7.0–30.0)

## 2019-07-19 LAB — RESPIRATORY PANEL BY RT PCR (FLU A&B, COVID)
Influenza A by PCR: NEGATIVE
Influenza B by PCR: NEGATIVE
SARS Coronavirus 2 by RT PCR: NEGATIVE

## 2019-07-19 LAB — TYPE AND SCREEN
ABO/RH(D): O POS
Antibody Screen: NEGATIVE

## 2019-07-19 LAB — ACETAMINOPHEN LEVEL: Acetaminophen (Tylenol), Serum: <10 ug/mL — ABNORMAL LOW (ref 10–30)

## 2019-07-19 LAB — LIPASE, BLOOD: Lipase: 73 U/L — ABNORMAL HIGH (ref 11–51)

## 2019-07-19 MED ORDER — THIAMINE HCL 100 MG PO TABS
100.0000 mg | ORAL_TABLET | Freq: Every day | ORAL | Status: DC
  Administered 2019-07-20: 100 mg via ORAL
  Filled 2019-07-19 (×2): qty 1

## 2019-07-19 MED ORDER — SODIUM CHLORIDE 0.9 % IV SOLN
8.0000 mg/h | INTRAVENOUS | Status: DC
  Administered 2019-07-19: 05:00:00 8 mg/h via INTRAVENOUS
  Filled 2019-07-19: qty 80

## 2019-07-19 MED ORDER — PANTOPRAZOLE SODIUM 40 MG IV SOLR
40.0000 mg | Freq: Two times a day (BID) | INTRAVENOUS | Status: DC
  Administered 2019-07-19 – 2019-07-20 (×2): 40 mg via INTRAVENOUS
  Filled 2019-07-19 (×2): qty 40

## 2019-07-19 MED ORDER — ONDANSETRON HCL 4 MG/2ML IJ SOLN: 4.0000 mg | Freq: Four times a day (QID) | INTRAMUSCULAR | Status: DC | PRN

## 2019-07-19 MED ORDER — LORAZEPAM 1 MG PO TABS: 0.0000 mg | ORAL_TABLET | Freq: Two times a day (BID) | ORAL | Status: DC

## 2019-07-19 MED ORDER — ONDANSETRON HCL 4 MG/2ML IJ SOLN
4.0000 mg | Freq: Once | INTRAMUSCULAR | Status: AC
  Administered 2019-07-19: 4 mg via INTRAVENOUS
  Filled 2019-07-19: qty 2

## 2019-07-19 MED ORDER — ACETAMINOPHEN 325 MG PO TABS: 325.0000 mg | ORAL_TABLET | Freq: Four times a day (QID) | ORAL | Status: DC | PRN

## 2019-07-19 MED ORDER — NICOTINE 21 MG/24HR TD PT24
21.0000 mg | MEDICATED_PATCH | Freq: Every day | TRANSDERMAL | Status: DC
  Administered 2019-07-19 – 2019-07-20 (×2): 21 mg via TRANSDERMAL
  Filled 2019-07-19 (×2): qty 1

## 2019-07-19 MED ORDER — LORAZEPAM 2 MG/ML IJ SOLN: 0.0000 mg | Freq: Two times a day (BID) | INTRAMUSCULAR | Status: DC

## 2019-07-19 MED ORDER — THIAMINE HCL 100 MG/ML IJ SOLN: 100.0000 mg | Freq: Every day | INTRAMUSCULAR | Status: DC

## 2019-07-19 MED ORDER — FOLIC ACID 1 MG PO TABS
1.0000 mg | ORAL_TABLET | Freq: Every day | ORAL | Status: DC
  Administered 2019-07-19 – 2019-07-20 (×2): 1 mg via ORAL
  Filled 2019-07-19 (×2): qty 1

## 2019-07-19 MED ORDER — ONDANSETRON HCL 4 MG PO TABS: 4.0000 mg | ORAL_TABLET | Freq: Four times a day (QID) | ORAL | Status: DC | PRN

## 2019-07-19 MED ORDER — THIAMINE HCL 100 MG PO TABS: 100.0000 mg | ORAL_TABLET | Freq: Every day | ORAL | Status: DC

## 2019-07-19 MED ORDER — LACTATED RINGERS IV SOLN: INTRAVENOUS | Status: AC

## 2019-07-19 MED ORDER — AMLODIPINE BESYLATE 5 MG PO TABS
5.0000 mg | ORAL_TABLET | Freq: Every day | ORAL | Status: DC
  Administered 2019-07-19 – 2019-07-20 (×2): 5 mg via ORAL
  Filled 2019-07-19 (×2): qty 1

## 2019-07-19 MED ORDER — ADULT MULTIVITAMIN W/MINERALS CH
1.0000 | ORAL_TABLET | Freq: Every day | ORAL | Status: DC
  Administered 2019-07-19 – 2019-07-20 (×2): 1 via ORAL
  Filled 2019-07-19 (×2): qty 1

## 2019-07-19 MED ORDER — LORAZEPAM 2 MG/ML IJ SOLN: 1.0000 mg | INTRAMUSCULAR | Status: DC | PRN

## 2019-07-19 MED ORDER — SODIUM CHLORIDE 0.9 % IV SOLN
80.0000 mg | Freq: Once | INTRAVENOUS | Status: AC
  Administered 2019-07-19: 80 mg via INTRAVENOUS
  Filled 2019-07-19: qty 80

## 2019-07-19 MED ORDER — LORAZEPAM 2 MG/ML IJ SOLN
0.0000 mg | Freq: Four times a day (QID) | INTRAMUSCULAR | Status: DC
  Administered 2019-07-19: 08:00:00 2 mg via INTRAVENOUS
  Administered 2019-07-19: 1 mg via INTRAVENOUS
  Filled 2019-07-19 (×2): qty 1

## 2019-07-19 MED ORDER — ACETAMINOPHEN 650 MG RE SUPP: 325.0000 mg | Freq: Four times a day (QID) | RECTAL | Status: DC | PRN

## 2019-07-19 MED ORDER — LORAZEPAM 1 MG PO TABS
0.0000 mg | ORAL_TABLET | Freq: Four times a day (QID) | ORAL | Status: DC
  Administered 2019-07-19: 2 mg via ORAL
  Filled 2019-07-19 (×2): qty 2

## 2019-07-19 MED ORDER — THIAMINE HCL 100 MG/ML IJ SOLN
100.0000 mg | Freq: Every day | INTRAMUSCULAR | Status: DC
  Administered 2019-07-19: 100 mg via INTRAVENOUS
  Filled 2019-07-19: qty 2

## 2019-07-19 MED ORDER — LORAZEPAM 1 MG PO TABS
1.0000 mg | ORAL_TABLET | ORAL | Status: DC | PRN
Start: 2019-07-19 — End: 2019-07-19

## 2019-07-19 NOTE — ED Notes (Signed)
Date and time results received: 07/19/19 0046 (use smartphrase ".now" to insert current time)  Test: ETOH Critical Value:323  Name of Provider Notified: Palumbo MD  Orders Received? Or Actions Taken?: waiting on orders

## 2019-07-19 NOTE — ED Notes (Signed)
Per Dr. Lonny Prude this patient is appropriate for med/surg. Pt being transported to 1312. Report called.

## 2019-07-19 NOTE — Progress Notes (Signed)
Patient seen and examined at bedside, patient admitted after midnight, please see earlier detailed admission note by Mariel Aloe, MD. Briefly, patient presented secondary to self reported seizure. FFound to have history of melena in setting of known varices and chronic alcohol use. Hemoglobin trended down but stable. No current bleeding. Patient states he is likely not going to quit drinking alcohol because he "can't." He states he was not taking his medication because he could not get them. Will continue to watch hemoglobin for continued stability. If stable in the morning, will discharge. Need for complete alcohol cessation discussed with the patient. Will have him on a clear liquid diet for now. Continue CIWA.   Cordelia Poche, MD [facility] 07/19/2019, 4:12 PM

## 2019-07-19 NOTE — H&P (Signed)
History and Physical    Barry Moore [id number] DOB: [birth year] DOA: 07/18/2019  PCP: Patient, No Pcp Per  Patient coming from:  Home   Chief Complaint:  Chief Complaint  Patient presents with  . Seizures  . Alcohol Problem     HPI:    54 year old male with past medical history of alcohol abuse, alcoholic cirrhosis with chronic thrombocytopenia, recent identification of small esophageal varices (EGD 0000000) and alcoholic gastritis with duodenitis, hypertension who presents to [facility] long hospital with complaints of abdominal pain, melena and an episode of syncope.  Patient is a poor historian secondary to alcohol intoxication.  Patient explains that since his discharge from [facility] on 4/25 he has been experiencing continued episodes of melena.  Experiencing at least 3 episodes of melena daily.  Patient denies any frank blood.  Melena is associated with generalized abdominal pain, moderate intensity, crampy in quality without any alleviating or exacerbating factors.  Patient denies any nausea or vomiting.  Patient states that since his discharge from the hospital he has not taken his prescribed PPI and has continued to drink approximately 20 beers daily (five 40 ounce bottes).    Yesterday, the patient states that he was drinking his beer and walking when he suddenly lost consciousness.  He is unable to recall any further details.  He thought that he also experienced a "seizure" but denies tongue biting, self urination, self defecation and there were no witnesses.  Patient eventually presented to [facility] long hospital with the above listed complaints.  Upon evaluation at [facility] patient has been found to have a drop in hemoglobin from 8.2 on 4/25 to 7.3 today.  Stool Hemoccult performed in the emergency department is positive for blood.  Intravenous PPI infusion was initiated.  The hospitalist group has been called to assess patient for admission the hospital.     Review  of Systems: Unable to perform due to patient being a poor historian due to alcohol intoxication.  Past Medical History:  Diagnosis Date  . Alcoholic hepatitis   . Depression   . ETOH abuse   . Gastric bleed 08/2018  . Hypertension   . Pancreatitis   . Seizures ([place])   . Suicidal behavior     Past Surgical History:  Procedure Laterality Date  . BIOPSY  08/05/2018   Procedure: BIOPSY;  Surgeon: Rush Landmark Telford Nab., MD;  Location: [place];  Service: Gastroenterology;;  . BIOPSY  08/07/2018   Procedure: BIOPSY;  Surgeon: Thornton Park, MD;  Location: [facility] ENDOSCOPY;  Service: Gastroenterology;;  . BIOPSY  01/02/2019   Procedure: BIOPSY;  Surgeon: Gatha Mayer, MD;  Location: [facility] ENDOSCOPY;  Service: Endoscopy;;  . COLONOSCOPY WITH PROPOFOL N/A 08/07/2018   Procedure: COLONOSCOPY WITH PROPOFOL;  Surgeon: Thornton Park, MD;  Location: [place];  Service: Gastroenterology;  Laterality: N/A;  . ESOPHAGOGASTRODUODENOSCOPY N/A 01/02/2019   Procedure: ESOPHAGOGASTRODUODENOSCOPY (EGD);  Surgeon: Gatha Mayer, MD;  Location: [facility];  Service: Endoscopy;  Laterality: N/A;  . ESOPHAGOGASTRODUODENOSCOPY N/A 07/03/2019   Procedure: ESOPHAGOGASTRODUODENOSCOPY (EGD);  Surgeon: Wonda Horner, MD;  Location: [facility];  Service: Endoscopy;  Laterality: N/A;  . ESOPHAGOGASTRODUODENOSCOPY (EGD) WITH PROPOFOL N/A 11/02/2016   Procedure: ESOPHAGOGASTRODUODENOSCOPY (EGD) WITH PROPOFOL;  Surgeon: Carol Ada, MD;  Location: [facility] ENDOSCOPY;  Service: Endoscopy;  Laterality: N/A;  . ESOPHAGOGASTRODUODENOSCOPY (EGD) WITH PROPOFOL N/A 08/05/2018   Procedure: ESOPHAGOGASTRODUODENOSCOPY (EGD) WITH PROPOFOL;  Surgeon: Rush Landmark Telford Nab., MD;  Location: [place];  Service: Gastroenterology;  Laterality: N/A;  .  ESOPHAGOGASTRODUODENOSCOPY (EGD) WITH PROPOFOL N/A 07/14/2019   Procedure: ESOPHAGOGASTRODUODENOSCOPY (EGD) WITH PROPOFOL;  Surgeon: Otis Brace, MD;  Location: [facility]  ENDOSCOPY;  Service: Gastroenterology;  Laterality: N/A;  . HERNIA REPAIR    . LEG SURGERY    . POLYPECTOMY  08/07/2018   Procedure: POLYPECTOMY;  Surgeon: Thornton Park, MD;  Location: [place];  Service: Gastroenterology;;     reports that he has been smoking cigarettes. He has been smoking about 1.00 pack per day. He has never used smokeless tobacco. He reports current alcohol use. He reports previous drug use. Frequency: 3.00 times per week. Drug: Cocaine.  Allergies  Allergen Reactions  . Tomato Shortness Of Breath and Nausea And Vomiting  . Aspirin Nausea And Vomiting  . Sulfa Antibiotics Other (See Comments)    unknown    Family History  Problem Relation Age of Onset  . Diabetes Mother   . Alcoholism Mother   . Emphysema Father   . Lung cancer Father   . Alcoholism Father      Prior to Admission medications   Medication Sig Start Date End Date Taking? Authorizing Provider  amLODipine (NORVASC) 5 MG tablet Take 1 tablet (5 mg total) by mouth daily. 07/15/19 07/14/20  Georgette Shell, MD  chlordiazePOXIDE (LIBRIUM) 25 MG capsule Take 1 capsule (25 mg total) by mouth 3 (three) times daily as needed for up to 5 days for anxiety. 07/15/19 07/20/19  Georgette Shell, MD  folic acid (FOLVITE) 1 MG tablet Take 1 tablet (1 mg total) by mouth daily. 07/16/19   Georgette Shell, MD  Multiple Vitamin (MULTIVITAMIN WITH MINERALS) TABS tablet Take 1 tablet by mouth daily. 07/16/19   Georgette Shell, MD  pantoprazole (PROTONIX) 40 MG tablet Take 1 tablet (40 mg total) by mouth 2 (two) times daily. 07/15/19   Georgette Shell, MD  thiamine 100 MG tablet Take 1 tablet (100 mg total) by mouth daily. 07/16/19   Georgette Shell, MD  ferrous sulfate 325 (65 FE) MG tablet Take 1 tablet (325 mg total) by mouth daily with breakfast. Patient not taking: Reported on 09/23/2018 08/09/18 10/09/18  Elodia Florence., MD    Physical Exam: Vitals:   07/19/19 0051 07/19/19  0330 07/19/19 0340 07/19/19 0632  BP: 132/82 (!) 148/83 (!) 148/83 (!) 148/80  Pulse: 94 85 85 90  Resp:  18  18  Temp:      TempSrc:      SpO2:  94%  96%  Weight:      Height:        Constitutional: Lethargic but arousable and oriented x3, patient is not in any acute distress. Skin: no rashes, no lesions, poor skin turgor noted. Eyes: Pupils are equally reactive to light.  No evidence of scleral icterus or conjunctival pallor.  ENMT: Slightly dry mucous membranes.  Posterior pharynx clear of any exudate or lesions. Normal dentition.   Neck: normal, supple, no masses, no thyromegaly Respiratory: clear to auscultation bilaterally, no wheezing, no crackles. Normal respiratory effort. No accessory muscle use.  Cardiovascular: Regular rate and rhythm, no murmurs / rubs / gallops. No extremity edema. 2+ pedal pulses. No carotid bruits.  Back:   Nontender without crepitus or deformity. Abdomen: Notable generalized abdominal tenderness.  Abdomen is soft however.  No evidence of intra-abdominal masses.  Positive bowel sounds noted in all quadrants.   Musculoskeletal: No joint deformity upper and lower extremities. Good ROM, no contractures. Normal muscle tone.  Neurologic: Patient is lethargic  but arousable.  Sensation is grossly intact.  Patient is moving all 4 extremities spontaneously.  Patient is following all commands.  Patient is responsive to verbal stimuli.   Psychiatric: Unable to fully assess due to patient being so lethargic.  Patient currently does not seem to possess insight as to his current situation.   Labs on Admission: I have personally reviewed following labs and imaging studies -   CBC: Recent Labs  Lab 07/13/19 2205 07/13/19 2205 07/14/19 0350 07/14/19 1107 07/14/19 1911 07/15/19 0514 07/19/19 0008  WBC 4.5  --  3.4*  --   --  4.0 4.0  NEUTROABS 2.1  --   --   --   --   --  1.2*  HGB 8.7*   < > 7.8* 7.8* 7.8* 8.2* 7.3*  HCT 29.3*   < > 26.3* 26.0* 26.3* 27.4*  24.3*  MCV 76.5*  --  76.9*  --   --  76.8* 77.4*  PLT 128*  --  118*  --   --  96* 105*   < > = values in this interval not displayed.   Basic Metabolic Panel: Recent Labs  Lab 07/13/19 2205 07/14/19 0350 07/15/19 0514 07/19/19 0008  NA 140 140 135 137  K 3.8 4.0 3.7 3.7  CL 103 104 102 104  CO2 27 24 24 25   GLUCOSE 103* 88 92 100*  BUN 9 11 11 7   CREATININE 0.74 0.63 0.73 0.65  CALCIUM 8.7* 8.5* 8.7* 8.3*  MG  --  2.3  --   --   PHOS  --  3.9  --   --    GFR: Estimated Creatinine Clearance: 102.1 mL/min (by C-G formula based on SCr of 0.65 mg/dL). Liver Function Tests: Recent Labs  Lab 07/13/19 2205 07/14/19 0350 07/15/19 0514 07/19/19 0008  AST 113* 96* 95* 105*  ALT 30 27 28 29   ALKPHOS 181* 159* 174* 205*  BILITOT 0.6 0.6 1.7* 0.6  PROT 8.6* 7.6 7.8 8.0  ALBUMIN 3.6 3.4* 3.2* 3.4*   Recent Labs  Lab 07/19/19 0008  LIPASE 73*   Recent Labs  Lab 07/15/19 0514  AMMONIA 48*   Coagulation Profile: Recent Labs  Lab 07/13/19 2205 07/19/19 0316  INR 1.1 1.1   Cardiac Enzymes: No results for input(s): CKTOTAL, CKMB, CKMBINDEX, TROPONINI in the last 168 hours. BNP (last 3 results) No results for input(s): PROBNP in the last 8760 hours. HbA1C: No results for input(s): HGBA1C in the last 72 hours. CBG: No results for input(s): GLUCAP in the last 168 hours. Lipid Profile: No results for input(s): CHOL, HDL, LDLCALC, TRIG, CHOLHDL, LDLDIRECT in the last 72 hours. Thyroid Function Tests: No results for input(s): TSH, T4TOTAL, FREET4, T3FREE, THYROIDAB in the last 72 hours. Anemia Panel: No results for input(s): VITAMINB12, FOLATE, FERRITIN, TIBC, IRON, RETICCTPCT in the last 72 hours. Urine analysis:    Component Value Date/Time   COLORURINE STRAW (A) 04/28/2019 1623   APPEARANCEUR CLEAR 04/28/2019 1623   LABSPEC 1.002 (L) 04/28/2019 1623   PHURINE 5.0 04/28/2019 1623   GLUCOSEU NEGATIVE 04/28/2019 1623   HGBUR SMALL (A) 04/28/2019 1623    BILIRUBINUR NEGATIVE 04/28/2019 1623   KETONESUR NEGATIVE 04/28/2019 1623   PROTEINUR NEGATIVE 04/28/2019 1623   NITRITE NEGATIVE 04/28/2019 1623   LEUKOCYTESUR NEGATIVE 04/28/2019 1623    Radiological Exams on Admission: CT Head Wo Contrast  Result Date: 07/19/2019 CLINICAL DATA:  Headache, posttraumatic, trying to detox EXAM: CT HEAD WITHOUT CONTRAST TECHNIQUE: Contiguous axial images  were obtained from the base of the skull through the vertex without intravenous contrast. COMPARISON:  None. FINDINGS: Brain: No evidence of acute territorial infarction, hemorrhage, hydrocephalus,extra-axial collection or mass lesion/mass effect. There is dilatation the ventricles and sulci consistent with age-related atrophy. Low-attenuation changes in the deep white matter consistent with small vessel ischemia. Vascular: No hyperdense vessel or unexpected calcification. Skull: The skull is intact. No fracture or focal lesion identified. Sinuses/Orbits: The visualized paranasal sinuses and mastoid air cells are clear. The orbits and globes intact. Other: None Cervical spine: Alignment: There is straightening of the normal cervical lordosis. Skull base and vertebrae: Visualized skull base is intact. No atlanto-occipital dissociation. The vertebral body heights are well maintained. No fracture or pathologic osseous lesion seen. Soft tissues and spinal canal: The visualized paraspinal soft tissues are unremarkable. No prevertebral soft tissue swelling is seen. The spinal canal is grossly unremarkable, no large epidural collection or significant canal narrowing. Disc levels: Mild cervical spine spondylosis is seen within the mid cervical spine with uncovertebral osteophytes and small anterior osteophytes most notable at C4-C5 with mild neural foraminal narrowing. Upper chest: Calcified granuloma seen at the left lung apex. Thoracic inlet is within normal limits. Other: None IMPRESSION: No acute intracranial abnormality.  Findings consistent with age related atrophy and chronic small vessel ischemia No acute fracture or malalignment of the spine. Electronically Signed   By: Prudencio Pair M.D.   On: 07/19/2019 02:21   CT Cervical Spine Wo Contrast  Result Date: 07/19/2019 CLINICAL DATA:  Headache, posttraumatic, trying to detox EXAM: CT HEAD WITHOUT CONTRAST TECHNIQUE: Contiguous axial images were obtained from the base of the skull through the vertex without intravenous contrast. COMPARISON:  None. FINDINGS: Brain: No evidence of acute territorial infarction, hemorrhage, hydrocephalus,extra-axial collection or mass lesion/mass effect. There is dilatation the ventricles and sulci consistent with age-related atrophy. Low-attenuation changes in the deep white matter consistent with small vessel ischemia. Vascular: No hyperdense vessel or unexpected calcification. Skull: The skull is intact. No fracture or focal lesion identified. Sinuses/Orbits: The visualized paranasal sinuses and mastoid air cells are clear. The orbits and globes intact. Other: None Cervical spine: Alignment: There is straightening of the normal cervical lordosis. Skull base and vertebrae: Visualized skull base is intact. No atlanto-occipital dissociation. The vertebral body heights are well maintained. No fracture or pathologic osseous lesion seen. Soft tissues and spinal canal: The visualized paraspinal soft tissues are unremarkable. No prevertebral soft tissue swelling is seen. The spinal canal is grossly unremarkable, no large epidural collection or significant canal narrowing. Disc levels: Mild cervical spine spondylosis is seen within the mid cervical spine with uncovertebral osteophytes and small anterior osteophytes most notable at C4-C5 with mild neural foraminal narrowing. Upper chest: Calcified granuloma seen at the left lung apex. Thoracic inlet is within normal limits. Other: None IMPRESSION: No acute intracranial abnormality. Findings consistent with  age related atrophy and chronic small vessel ischemia No acute fracture or malalignment of the spine. Electronically Signed   By: Prudencio Pair M.D.   On: 07/19/2019 02:21   DG Hip Unilat W or Wo Pelvis 2-3 Views Left  Result Date: 07/19/2019 CLINICAL DATA:  Left hip pain status post fall. EXAM: DG HIP (WITH OR WITHOUT PELVIS) 2-3V LEFT COMPARISON:  None. FINDINGS: There is no evidence of hip fracture or dislocation. There is no evidence of arthropathy or other focal bone abnormality. IMPRESSION: Negative. Electronically Signed   By: Virgina Norfolk M.D.   On: 07/19/2019 02:12  Assessment/Plan Active Problems:   Upper GI bleed   Patient has recently diagnosed small esophageal varices with evidence of alcoholic gastritis and mild duodenitis on EGD just several days ago at [facility]  Patient has not taken his prescribed PPI at time of discharge and continues to drink five 20 ounce bottles of beer daily, allowing his upper gastrointestinal bleeding to continue since discharge.  Hemoglobin has dropped slightly to 7.3 from 8.2 at time of discharge on 4/25.  Stool Hemoccult positive in the emergency department however there was no gross bleeding on rectal examination nor has the patient exhibited any further episodes of melena upon arrival to the emergency department.  Based on results of recent EGD, the best course of action for this patient is to simply keep him off of alcohol and place patient on 40 mg of IV Protonix every 12 hours  Performing CBCs every 6 hours  Type and screen obtained, will transfuse patient if hemoglobin drops below 7.  Hydrating patient with intravenous isotonic fluids  If hemoglobin continues to drop requiring transfusion or bleeding increases will consult gastroenterology.    Orthostatic syncope   Patient reports an episode of syncope yesterday, although details are scant due to patient being intoxicated  If anything, this is likely secondary to  orthostatic syncope due to volume depletion related to blood loss  Patient also reports associated "seizures" surrounding his syncope.  There were no witnesses, no tongue biting, no self urination or defecation.  Is unlikely that these were true seizures although even if they were true seizures, the seizures would likely be related to alcohol intoxication.  Hydrating patient with intravenous isotonic fluids  Once intoxication resolves we will perform orthostatic vital signs    Thrombocytopenia (HCC)   Longstanding known history of thrombocytopenia likely secondary to alcohol abuse and likely cirrhosis  SCDs for DVT prophylaxis  Monitor platelet counts with serial CBCs    Essential hypertension   Continue home regimen of amlodipine although patient is noncompliant with this regimen in the outpatient setting    Nicotine dependence, cigarettes, uncomplicated   Counseling patient daily on cessation  Patient with nicotine replacement therapy while hospitalized    Alcohol abuse with intoxication ([place])   Patient presenting with significant alcohol intoxication and alcohol level 323 on arrival resulting in the patient being extremely lethargic and a poor historian  We will monitor patient closely for evidence of withdrawal as intoxication subsides  CIWA protocol ordered       Code Status:  Full code Family Communication: Deferred  Status is: Observation  The patient remains OBS appropriate and will d/c before 2 midnights.  Dispo: The patient is from: Home              Anticipated d/c is to: Home              Anticipated d/c date is: 2 days              Patient currently is not medically stable to d/c.        Vernelle Emerald MD [facility] Pager [phone]  If 7PM-7AM, please contact night-coverage [email or web address] Use universal [place] password for that web site. If you do not have the password, please call the hospital operator.  07/19/2019,  7:02 AM

## 2019-07-20 DIAGNOSIS — F17210 Nicotine dependence, cigarettes, uncomplicated: Secondary | ICD-10-CM

## 2019-07-20 DIAGNOSIS — F10129 Alcohol abuse with intoxication, unspecified: Secondary | ICD-10-CM

## 2019-07-20 DIAGNOSIS — D696 Thrombocytopenia, unspecified: Secondary | ICD-10-CM

## 2019-07-20 DIAGNOSIS — I10 Essential (primary) hypertension: Secondary | ICD-10-CM

## 2019-07-20 LAB — CBC
HCT: 26.3 % — ABNORMAL LOW (ref 39.0–52.0)
HCT: 27.6 % — ABNORMAL LOW (ref 39.0–52.0)
Hemoglobin: 7.9 g/dL — ABNORMAL LOW (ref 13.0–17.0)
Hemoglobin: 8.4 g/dL — ABNORMAL LOW (ref 13.0–17.0)
MCH: 23.2 pg — ABNORMAL LOW (ref 26.0–34.0)
MCH: 23.4 pg — ABNORMAL LOW (ref 26.0–34.0)
MCHC: 30 g/dL (ref 30.0–36.0)
MCHC: 30.4 g/dL (ref 30.0–36.0)
MCV: 77.4 fL — ABNORMAL LOW (ref 80.0–100.0)
MCV: 76.9 fL — ABNORMAL LOW (ref 80.0–100.0)
Platelets: 87 10*3/uL — ABNORMAL LOW (ref 150–400)
Platelets: 95 10*3/uL — ABNORMAL LOW (ref 150–400)
RBC: 3.4 MIL/uL — ABNORMAL LOW (ref 4.22–5.81)
RBC: 3.59 MIL/uL — ABNORMAL LOW (ref 4.22–5.81)
RDW: 24.5 % — ABNORMAL HIGH (ref 11.5–15.5)
RDW: 24 % — ABNORMAL HIGH (ref 11.5–15.5)
WBC: 3.7 10*3/uL — ABNORMAL LOW (ref 4.0–10.5)
WBC: 3.7 10*3/uL — ABNORMAL LOW (ref 4.0–10.5)
nRBC: 0 % (ref 0.0–0.2)
nRBC: 0 % (ref 0.0–0.2)

## 2019-07-20 LAB — COMPREHENSIVE METABOLIC PANEL
ALT: 29 U/L (ref 0–44)
AST: 92 U/L — ABNORMAL HIGH (ref 15–41)
Albumin: 3.4 g/dL — ABNORMAL LOW (ref 3.5–5.0)
Alkaline Phosphatase: 165 U/L — ABNORMAL HIGH (ref 38–126)
Anion gap: 9 (ref 5–15)
BUN: 7 mg/dL (ref 6–20)
CO2: 25 mmol/L (ref 22–32)
Calcium: 8.7 mg/dL — ABNORMAL LOW (ref 8.9–10.3)
Chloride: 98 mmol/L (ref 98–111)
Creatinine, Ser: 0.6 mg/dL — ABNORMAL LOW (ref 0.61–1.24)
GFR calc Af Amer: >60 mL/min (ref >60)
GFR calc non Af Amer: >60 mL/min (ref >60)
Glucose, Bld: 100 mg/dL — ABNORMAL HIGH (ref 70–99)
Potassium: 3.6 mmol/L (ref 3.5–5.1)
Sodium: 132 mmol/L — ABNORMAL LOW (ref 135–145)
Total Bilirubin: 2 mg/dL — ABNORMAL HIGH (ref 0.3–1.2)
Total Protein: 7.5 g/dL (ref 6.5–8.1)

## 2019-07-20 LAB — MAGNESIUM: Magnesium: 1.9 mg/dL (ref 1.7–2.4)

## 2019-07-20 MED ORDER — PANTOPRAZOLE SODIUM 40 MG PO TBEC: 40.0000 mg | DELAYED_RELEASE_TABLET | Freq: Two times a day (BID) | ORAL | 0 refills | Status: DC

## 2019-07-20 MED ORDER — AMLODIPINE BESYLATE 5 MG PO TABS
5.0000 mg | ORAL_TABLET | Freq: Every day | ORAL | 0 refills | Status: DC
Start: 2019-07-20 — End: 2019-12-28

## 2019-07-20 MED ORDER — NICOTINE 21 MG/24HR TD PT24: 21.0000 mg | MEDICATED_PATCH | Freq: Every day | TRANSDERMAL | 0 refills | Status: DC

## 2019-07-20 NOTE — TOC Transition Note (Signed)
Transition of Care [facility]) - CM/SW Discharge Note   Patient Details  Name: Barry Moore MRN: [id number] Date of Birth: 1965/09/26  [facility] CM/SW Contact:  Lennart Pall, LCSW Phone Number: 07/20/2019, 3:18 PM   Clinical Narrative:   Met with pt yesterday and today to coordinate d/c plans.  Pt has been homeless (most recently) for ~ 3 weeks since he was evicted from his housing due to "trying to help people out and they kicked me out".  He has had several admissions in the past month and, each time, [facility] staff have discussed SA treatment and he has declined.  Pt's only request of this SW was to see if I could get the tent and sleeping bag that he was hoping to get last admission.  This morning I contacted Epimenio Sarin with [facility] who confirms that pt is in their system from prior referrals.  She confirms that pt was actually followed by [facility] and set up with housing in 2019 (this seems to be the location he was evicted from).  She then alerted the [facility] director and his case manager that pt was here and pt was agreeable to them coming to speak with him.  Tonya Clinard and Stacy (CM) met with pt this afternoon and he was very pleasant and fully understood the reasons for his eviction.  They both pleaded with him repeatedly to agree to inpatient SA rehab and guaranteed to him that, if he would complete a 30-60 day program, he would have housing when he was discharged.  Pt simply would not agree to go into a program at this time.  Michela Pitcher he would call his CM on Monday and would consider going next week.    Pt is medically cleared for dc.  I have confirmed that he is still an active patient at [facility].  I did place him into [facility] program due to hour of the day and unable to get him to [facility] before closing.    I provided him with a tent and sleeping bag that I was able to get at [facility] office. Also, provided pt with  bus passes to get to his "block" where he chooses to stay.     Final next level of care: (homeless) Barriers to Discharge: Barriers Resolved   Patient Goals and CMS Choice Patient states their goals for this hospitalization and ongoing recovery are:: still would like tent and sleeping bag      Discharge Placement                       Discharge Plan and Services In-house Referral: Clinical Social Work                                   Social Determinants of Health (SDOH) Interventions     Readmission Risk Interventions Readmission Risk Prevention Plan 07/20/2019 01/03/2019 09/01/2018  Transportation Screening Complete Complete Complete  PCP or Specialist Appt within 5-7 Days - - -  Home Care Screening - - -  Medication Review (RN CM) - - -  Medication Review (RN Transport planner) Complete Complete Complete  PCP or Specialist appointment within 3-5 days of discharge - Not Complete Complete  PCP/Specialist Appt Not Complete comments - Got first available appointments -  [facility] or Home Care Consult - Not Complete Complete  SW Recovery Care/Counseling  Consult Complete Not Complete Complete  Palliative Care Screening Not Applicable Not Applicable Not Applicable  Skilled Nursing Facility Not Applicable Not Applicable Not Applicable  Some recent data might be hidden

## 2019-07-20 NOTE — Progress Notes (Signed)
Discharge instructions given to pt and all questions were answered.  

## 2019-07-20 NOTE — Discharge Instructions (Signed)
Barry Moore,  You were in the hospital because of bleeding. This is related to your alcohol use. We discussed alcohol cessation however you are not ready for that at this time per our discussion. Please take your medications as prescribed.  Upper Gastrointestinal Bleeding  Upper gastrointestinal (GI) bleeding is bleeding from the swallowing tube (esophagus), stomach, or the first part of the small intestine (duodenum). If you have upper GI bleeding, you may vomit blood or have bloody or black stools. Bleeding can range from mild to serious or even life-threatening. If there is a lot of bleeding, you may need to stay in the hospital. What are the causes? This condition may be caused by:  Ulcer disease of the stomach (peptic ulcer) or duodenum. This is the most common cause of GI bleeding.  Inflammation, irritation, or swelling of the esophagus (esophagitis).  A tear in the esophagus.  Cancer of the esophagus, stomach, or duodenum.  An abnormal or weakened blood vessel in one of the upper GI structures.  A bleeding disorder that impairs the formation of blood clots and causes easy bleeding (coagulopathy). What increases the risk? The following factors may make you more likely to develop this condition:  Being older than 55 years of age.  Being male.  Having another long-term disease, especially liver or kidney disease.  Having a stomach infection caused by Helicobacter pylori bacteria.  Having frequent or severe vomiting.  Abusing alcohol.  Taking certain medicines for a long time, such as: ? NSAIDs. ? Anticoagulants. What are the signs or symptoms? Symptoms of this condition include:  Vomiting blood.  Black or maroon-colored stools.  Bloody stools.  Weakness or dizziness.  Heartburn.  Abdominal pain.  Difficulty swallowing.  Weight loss.  Yellow eyes or skin (jaundice).  Racing heartbeat. How is this diagnosed? This condition may be diagnosed based  on:  Your symptoms and medical history.  A physical exam. During the exam, your health care provider will check for signs of blood loss, such as low blood pressure and a rapid pulse.  Tests, such as: ? Blood tests to measure your blood cell count and to check for other signs of blood loss and clotting ability. ? Blood tests to check your liver and kidney function. ? A chest X-ray to look for a tear in the esophagus. ? Endoscopy. In this procedure, a flexible scope is put down your esophagus and into your stomach or duodenum to look for the source of bleeding. ? Angiogram. This may be done if the source of bleeding is not found during endoscopy. For an angiogram, X-rays are taken after a dye is injected into your bloodstream. ? Nasogastric tube insertion. This is a tube passed through your nose and down into your stomach. It may be connected to a source of gentle suction to see if any blood comes out. How is this treated? Treatment for this condition depends on the cause of the bleeding. Active bleeding is treated at the hospital. Treatment may include:  Getting fluids through an IV tube inserted into one of your veins.  Getting blood through an IV tube (blood transfusion).  Getting high doses of medicine through the IV to lower stomach acid. This may be done to treat ulcer disease.  Having endoscopy to treat an area of bleeding with high heat (coagulation), injections, or surgical clips.  Having a procedure that involves first doing an angiogram and then blocking blood flow to the bleeding site (embolization).  Stopping or changing some of your  regular medicines for a certain amount of time.  Having other surgical procedures if initial treatments do not control bleeding. Follow these instructions at home:  Take over-the-counter and prescription medicines only as told by your health care provider. You may need to avoid NSAIDs or other medicines that increase bleeding.  Do not drink  alcohol.  Drink enough fluid to keep your urine clear or pale yellow.  Follow instructions from your health care provider about eating or drinking restrictions.  Return to your normal activities as told by your health care provider. Ask your health care provider what activities are safe for you.  Do not use any tobacco products, such as cigarettes, chewing tobacco, and e-cigarettes. If you need help quitting, ask your health care provider.  Keep all follow-up visits as told by your health care provider. This is important. Contact a health care provider if:  You have abdominal pain or heartburn.  You have unexplained weight loss.  You have trouble swallowing.  You have frequent vomiting.  You develop jaundice.  You feel weak or dizzy.  You need help to stop smoking or drinking alcohol. Get help right away if:  You have vomiting with blood.  You have blood in your stools.  You have severe cramps in your back or abdomen.  Your symptoms of upper GI bleeding come back after treatment. This information is not intended to replace advice given to you by your health care provider. Make sure you discuss any questions you have with your health care provider. Document Revised: 02/18/2017 Document Reviewed: 07/24/2015 [employer] Patient Education  [employer].

## 2019-07-20 NOTE — Discharge Summary (Signed)
Physician Discharge Summary  Barry Moore [id number] DOB: 1965/05/23 DOA: 07/18/2019  PCP: Antony Blackbird, MD  Admit date: 07/18/2019 Discharge date: 07/20/2019  Admitted From: Home Disposition: Home  Recommendations for Outpatient Follow-up:  1. Follow up with PCP in 1 week 2. Please obtain CBC in one week 3. Alcohol cessation discussions 4. Please follow up on the following pending results: None  Home Health: None Equipment/Devices: None  Discharge Condition: Guarded CODE STATUS: Full code Diet recommendation: Heart healthy   Brief/Interim Summary:  Admission HPI written by Inda Merlin, MD   HPI:    54 year old male with past medical history of alcohol abuse, alcoholic cirrhosis with chronic thrombocytopenia, recent identification of small esophageal varices (EGD 0000000) and alcoholic gastritis with duodenitis, hypertension who presents to [facility] long hospital with complaints of abdominal pain, melena and an episode of syncope.  Patient is a poor historian secondary to alcohol intoxication.  Patient explains that since his discharge from [facility] on 4/25 he has been experiencing continued episodes of melena.  Experiencing at least 3 episodes of melena daily.  Patient denies any frank blood.  Melena is associated with generalized abdominal pain, moderate intensity, crampy in quality without any alleviating or exacerbating factors.  Patient denies any nausea or vomiting.  Patient states that since his discharge from the hospital he has not taken his prescribed PPI and has continued to drink approximately 20 beers daily (five 40 ounce bottes).    Yesterday, the patient states that he was drinking his beer and walking when he suddenly lost consciousness.  He is unable to recall any further details.  He thought that he also experienced a "seizure" but denies tongue biting, self urination, self defecation and there were no witnesses.  Patient eventually presented to  [facility] hospital with the above listed complaints.  Upon evaluation at [facility] patient has been found to have a drop in hemoglobin from 8.2 on 4/25 to 7.3 today.  Stool Hemoccult performed in the emergency department is positive for blood.  Intravenous PPI infusion was initiated.  The hospitalist group has been called to assess patient for admission the hospital.    Hospital course:  Upper GI bleeding Secondary to known varices and alcohol use. Hemoglobin stable while inpatient. Protonix given and new prescription provided on discharge.  Thrombocytopenia Essential hypertension Nicotine dependence Alcohol abuse and intoxication Patient not ready for cessation. No significant withdrawal prior to discharge. Social work consulted for resources.  Discharge Diagnoses:  Principal Problem:   Upper GI bleed Active Problems:   Thrombocytopenia ([place])   Essential hypertension   Nicotine dependence, cigarettes, uncomplicated   Alcohol abuse with intoxication ([place])   Orthostatic syncope   Upper GI bleeding    Discharge Instructions   Allergies as of 07/20/2019      Reactions   Tomato Shortness Of Breath, Nausea And Vomiting   Aspirin Nausea And Vomiting   Sulfa Antibiotics Other (See Comments)   unknown      Medication List    TAKE these medications   amLODipine 5 MG tablet Commonly known as: NORVASC Take 1 tablet (5 mg total) by mouth daily.   chlordiazePOXIDE 25 MG capsule Commonly known as: LIBRIUM Take 1 capsule (25 mg total) by mouth 3 (three) times daily as needed for up to 5 days for anxiety.   folic acid 1 MG tablet Commonly known as: FOLVITE Take 1 tablet (1 mg total) by mouth daily.   multivitamin with minerals Tabs tablet Take  1 tablet by mouth daily.   nicotine 21 mg/24hr patch Commonly known as: NICODERM CQ - dosed in mg/24 hours Place 1 patch (21 mg total) onto the skin daily. Start taking on: Jul 21, 2019   pantoprazole 40 MG  tablet Commonly known as: PROTONIX Take 1 tablet (40 mg total) by mouth 2 (two) times daily.   thiamine 100 MG tablet Take 1 tablet (100 mg total) by mouth daily.      Follow-up Information    Fulp, Cammie, MD. Schedule an appointment as soon as possible for a visit in 1 week(s).   Specialty: Family Medicine Why: need to call as soon as you have access to phone (at [facility]) to make a follow up appointment Contact information: [address] [place] [address] [phone]          Allergies  Allergen Reactions  . Tomato Shortness Of Breath and Nausea And Vomiting  . Aspirin Nausea And Vomiting  . Sulfa Antibiotics Other (See Comments)    unknown    Consultations:  None   Procedures/Studies: CT Head Wo Contrast  Result Date: 07/19/2019 CLINICAL DATA:  Headache, posttraumatic, trying to detox EXAM: CT HEAD WITHOUT CONTRAST TECHNIQUE: Contiguous axial images were obtained from the base of the skull through the vertex without intravenous contrast. COMPARISON:  None. FINDINGS: Brain: No evidence of acute territorial infarction, hemorrhage, hydrocephalus,extra-axial collection or mass lesion/mass effect. There is dilatation the ventricles and sulci consistent with age-related atrophy. Low-attenuation changes in the deep white matter consistent with small vessel ischemia. Vascular: No hyperdense vessel or unexpected calcification. Skull: The skull is intact. No fracture or focal lesion identified. Sinuses/Orbits: The visualized paranasal sinuses and mastoid air cells are clear. The orbits and globes intact. Other: None Cervical spine: Alignment: There is straightening of the normal cervical lordosis. Skull base and vertebrae: Visualized skull base is intact. No atlanto-occipital dissociation. The vertebral body heights are well maintained. No fracture or pathologic osseous lesion seen. Soft tissues and spinal canal: The visualized paraspinal soft tissues are unremarkable. No  prevertebral soft tissue swelling is seen. The spinal canal is grossly unremarkable, no large epidural collection or significant canal narrowing. Disc levels: Mild cervical spine spondylosis is seen within the mid cervical spine with uncovertebral osteophytes and small anterior osteophytes most notable at C4-C5 with mild neural foraminal narrowing. Upper chest: Calcified granuloma seen at the left lung apex. Thoracic inlet is within normal limits. Other: None IMPRESSION: No acute intracranial abnormality. Findings consistent with age related atrophy and chronic small vessel ischemia No acute fracture or malalignment of the spine. Electronically Signed   By: Prudencio Pair M.D.   On: 07/19/2019 02:21   CT HEAD WO CONTRAST  Result Date: 07/02/2019 CLINICAL DATA:  Altered mental status EXAM: CT HEAD WITHOUT CONTRAST TECHNIQUE: Contiguous axial images were obtained from the base of the skull through the vertex without intravenous contrast. COMPARISON:  06/21/2019 head CT FINDINGS: Brain: No acute territorial infarction, hemorrhage, or intracranial mass. Moderate atrophy somewhat advanced for age. Mild hypodensity in the white matter consistent with chronic small vessel ischemic change. Stable ventricle size Vascular: No hyperdense vessels.  Carotid vascular calcification Skull: Normal. Negative for fracture or focal lesion. Sinuses/Orbits: Mucosal thickening in the sinuses Other: None IMPRESSION: 1. No CT evidence for acute intracranial abnormality. 2. Advanced for age atrophy. Mild chronic small vessel ischemic change of the white matter Electronically Signed   By: Donavan Foil M.D.   On: 07/02/2019 01:32   CT Head Wo  Contrast  Result Date: 06/21/2019 CLINICAL DATA:  Headache, possible seizure, intoxication EXAM: CT HEAD WITHOUT CONTRAST TECHNIQUE: Contiguous axial images were obtained from the base of the skull through the vertex without intravenous contrast. COMPARISON:  12/13/2018 FINDINGS: Brain: No acute  infarct or hemorrhage. Lateral ventricles and midline structures are unremarkable. No acute extra-axial fluid collections. No mass effect. Vascular: No hyperdense vessel or unexpected calcification. Skull: Normal. Negative for fracture or focal lesion. Sinuses/Orbits: No acute finding. Other: None IMPRESSION: 1. No acute intracranial process. Electronically Signed   By: Randa Ngo M.D.   On: 06/21/2019 18:07   CT CHEST W CONTRAST  Result Date: 07/02/2019 CLINICAL DATA:  Status post trauma. EXAM: CT CHEST, ABDOMEN, AND PELVIS WITH CONTRAST TECHNIQUE: Multidetector CT imaging of the chest, abdomen and pelvis was performed following the standard protocol during bolus administration of intravenous contrast. CONTRAST:  170mL OMNIPAQUE IOHEXOL 300 MG/ML  SOLN COMPARISON:  April 28, 2019 FINDINGS: CT CHEST FINDINGS Cardiovascular: No significant vascular findings. Normal heart size. No pericardial effusion. Moderate to marked severity coronary artery calcification is seen. Mediastinum/Nodes: No enlarged mediastinal, hilar, or axillary lymph nodes. Thyroid gland, trachea, and esophagus demonstrate no significant findings. Lungs/Pleura: Small patchy areas of low attenuation are seen within the anterolateral aspect of the right upper lobe. The largest area measures approximately 1.7 cm x 1.5 cm, while the adjacent areas measure less than 1 cm. There is no evidence of a pleural effusion or pneumothorax. Musculoskeletal: A chronic fracture deformity is seen involving the mid left clavicle. CT ABDOMEN PELVIS FINDINGS Hepatobiliary: No focal liver abnormality is seen. No gallstones, gallbladder wall thickening, or biliary dilatation. Pancreas: Unremarkable. No pancreatic ductal dilatation or surrounding inflammatory changes. Spleen: Normal in size without focal abnormality. Adrenals/Urinary Tract: Adrenal glands are unremarkable. Kidneys are normal, without renal calculi, focal lesion, or hydronephrosis. Bladder is  unremarkable. Stomach/Bowel: Stomach is within normal limits. Appendix appears normal. No evidence of bowel wall thickening, distention, or inflammatory changes. Vascular/Lymphatic: Moderate severity aortic calcification. No enlarged abdominal or pelvic lymph nodes. Reproductive: Prostate is unremarkable. Other: Very mild nonspecific mesenteric inflammatory fat stranding is seen within the mid right abdomen. This is a adjacent to the anteromedial aspect of the right kidney and posterior to the hepatic flexure. This is seen on the prior study and is decreased in severity on the current exam. Musculoskeletal: No acute or significant osseous findings. IMPRESSION: 1. Small patchy areas of low attenuation within the anterolateral aspect of the right upper lobe, as described above. While these may represent areas of focal atelectasis, sequelae associated with pulmonary contusion cannot be excluded. 2. Very mild, chronic nonspecific mesenteric inflammatory fat stranding within the mid right abdomen. This is decreased in severity when compared to the prior study. 3. Moderate to marked severity coronary artery calcification. 4. Moderate severity aortic calcification. 5. Chronic fracture deformity involving the mid left clavicle. Aortic Atherosclerosis (ICD10-I70.0). Electronically Signed   By: Virgina Norfolk M.D.   On: 07/02/2019 01:42   CT Cervical Spine Wo Contrast  Result Date: 07/19/2019 CLINICAL DATA:  Headache, posttraumatic, trying to detox EXAM: CT HEAD WITHOUT CONTRAST TECHNIQUE: Contiguous axial images were obtained from the base of the skull through the vertex without intravenous contrast. COMPARISON:  None. FINDINGS: Brain: No evidence of acute territorial infarction, hemorrhage, hydrocephalus,extra-axial collection or mass lesion/mass effect. There is dilatation the ventricles and sulci consistent with age-related atrophy. Low-attenuation changes in the deep white matter consistent with small vessel  ischemia. Vascular: No hyperdense vessel or unexpected  calcification. Skull: The skull is intact. No fracture or focal lesion identified. Sinuses/Orbits: The visualized paranasal sinuses and mastoid air cells are clear. The orbits and globes intact. Other: None Cervical spine: Alignment: There is straightening of the normal cervical lordosis. Skull base and vertebrae: Visualized skull base is intact. No atlanto-occipital dissociation. The vertebral body heights are well maintained. No fracture or pathologic osseous lesion seen. Soft tissues and spinal canal: The visualized paraspinal soft tissues are unremarkable. No prevertebral soft tissue swelling is seen. The spinal canal is grossly unremarkable, no large epidural collection or significant canal narrowing. Disc levels: Mild cervical spine spondylosis is seen within the mid cervical spine with uncovertebral osteophytes and small anterior osteophytes most notable at C4-C5 with mild neural foraminal narrowing. Upper chest: Calcified granuloma seen at the left lung apex. Thoracic inlet is within normal limits. Other: None IMPRESSION: No acute intracranial abnormality. Findings consistent with age related atrophy and chronic small vessel ischemia No acute fracture or malalignment of the spine. Electronically Signed   By: Prudencio Pair M.D.   On: 07/19/2019 02:21   CT Cervical Spine Wo Contrast  Result Date: 06/21/2019 CLINICAL DATA:  Seizure, fell, intoxicated EXAM: CT CERVICAL SPINE WITHOUT CONTRAST TECHNIQUE: Multidetector CT imaging of the cervical spine was performed without intravenous contrast. Multiplanar CT image reconstructions were also generated. COMPARISON:  12/13/2018 FINDINGS: Alignment: Alignment is grossly anatomic. Skull base and vertebrae: No acute displaced fractures. Soft tissues and spinal canal: No prevertebral fluid or swelling. No visible canal hematoma. Disc levels: Minimal spondylosis at C4/C5 with anterior osteophyte formation. No  significant bony encroachment upon the central canal or neural foramina. Upper chest: There is a nonunion of a prior left clavicular fracture. Airway is patent. Lung apices are clear. Other: Reconstructed images demonstrate no additional findings. IMPRESSION: 1. No acute cervical spine fracture. Electronically Signed   By: Randa Ngo M.D.   On: 06/21/2019 18:12   CT ABDOMEN PELVIS W CONTRAST  Result Date: 07/02/2019 CLINICAL DATA:  Status post trauma. EXAM: CT CHEST, ABDOMEN, AND PELVIS WITH CONTRAST TECHNIQUE: Multidetector CT imaging of the chest, abdomen and pelvis was performed following the standard protocol during bolus administration of intravenous contrast. CONTRAST:  159mL OMNIPAQUE IOHEXOL 300 MG/ML  SOLN COMPARISON:  April 28, 2019 FINDINGS: CT CHEST FINDINGS Cardiovascular: No significant vascular findings. Normal heart size. No pericardial effusion. Moderate to marked severity coronary artery calcification is seen. Mediastinum/Nodes: No enlarged mediastinal, hilar, or axillary lymph nodes. Thyroid gland, trachea, and esophagus demonstrate no significant findings. Lungs/Pleura: Small patchy areas of low attenuation are seen within the anterolateral aspect of the right upper lobe. The largest area measures approximately 1.7 cm x 1.5 cm, while the adjacent areas measure less than 1 cm. There is no evidence of a pleural effusion or pneumothorax. Musculoskeletal: A chronic fracture deformity is seen involving the mid left clavicle. CT ABDOMEN PELVIS FINDINGS Hepatobiliary: No focal liver abnormality is seen. No gallstones, gallbladder wall thickening, or biliary dilatation. Pancreas: Unremarkable. No pancreatic ductal dilatation or surrounding inflammatory changes. Spleen: Normal in size without focal abnormality. Adrenals/Urinary Tract: Adrenal glands are unremarkable. Kidneys are normal, without renal calculi, focal lesion, or hydronephrosis. Bladder is unremarkable. Stomach/Bowel: Stomach is  within normal limits. Appendix appears normal. No evidence of bowel wall thickening, distention, or inflammatory changes. Vascular/Lymphatic: Moderate severity aortic calcification. No enlarged abdominal or pelvic lymph nodes. Reproductive: Prostate is unremarkable. Other: Very mild nonspecific mesenteric inflammatory fat stranding is seen within the mid right abdomen. This is a adjacent  to the anteromedial aspect of the right kidney and posterior to the hepatic flexure. This is seen on the prior study and is decreased in severity on the current exam. Musculoskeletal: No acute or significant osseous findings. IMPRESSION: 1. Small patchy areas of low attenuation within the anterolateral aspect of the right upper lobe, as described above. While these may represent areas of focal atelectasis, sequelae associated with pulmonary contusion cannot be excluded. 2. Very mild, chronic nonspecific mesenteric inflammatory fat stranding within the mid right abdomen. This is decreased in severity when compared to the prior study. 3. Moderate to marked severity coronary artery calcification. 4. Moderate severity aortic calcification. 5. Chronic fracture deformity involving the mid left clavicle. Electronically Signed   By: Virgina Norfolk M.D.   On: 07/02/2019 01:43   DG Hip Unilat W or Wo Pelvis 2-3 Views Left  Result Date: 07/19/2019 CLINICAL DATA:  Left hip pain status post fall. EXAM: DG HIP (WITH OR WITHOUT PELVIS) 2-3V LEFT COMPARISON:  None. FINDINGS: There is no evidence of hip fracture or dislocation. There is no evidence of arthropathy or other focal bone abnormality. IMPRESSION: Negative. Electronically Signed   By: Virgina Norfolk M.D.   On: 07/19/2019 02:12       Subjective: No issues. Feels better today  Discharge Exam: Vitals:   07/20/19 0631 07/20/19 1340  BP: (!) 162/85 126/76  Pulse: 83 97  Resp: 17 18  Temp: 98.3 F (36.8 C) 98.3 F (36.8 C)  SpO2: 100% 99%   Vitals:   07/19/19 1746  07/19/19 2231 07/20/19 0631 07/20/19 1340  BP: (!) 147/76 (!) 156/85 (!) 162/85 126/76  Pulse: 90 85 83 97  Resp: 18 16 17 18   Temp: 98.6 F (37 C) 99.1 F (37.3 C) 98.3 F (36.8 C) 98.3 F (36.8 C)  TempSrc: Oral Oral Oral Oral  SpO2: 98% 99% 100% 99%  Weight:      Height:        General: Pt is alert, awake, not in acute distress Cardiovascular: RRR, S1/S2 +, no rubs, no gallops Respiratory: CTA bilaterally, no wheezing, no rhonchi Abdominal: Soft, NT, ND, bowel sounds + Extremities: no edema, no cyanosis Neurology: slight tremor    The results of significant diagnostics from this hospitalization (including imaging, microbiology, ancillary and laboratory) are listed below for reference.     Microbiology: Recent Results (from the past 240 hour(s))  Respiratory Panel by RT PCR (Flu A&B, Covid) - Nasopharyngeal Swab     Status: None   Collection Time: 07/14/19 12:32 AM   Specimen: Nasopharyngeal Swab  Result Value Ref Range Status   SARS Coronavirus 2 by RT PCR NEGATIVE NEGATIVE Final    Comment: (NOTE) SARS-CoV-2 target nucleic acids are NOT DETECTED. The SARS-CoV-2 RNA is generally detectable in upper respiratoy specimens during the acute phase of infection. The lowest concentration of SARS-CoV-2 viral copies this assay can detect is 131 copies/mL. A negative result does not preclude SARS-Cov-2 infection and should not be used as the sole basis for treatment or other patient management decisions. A negative result may occur with  improper specimen collection/handling, submission of specimen other than nasopharyngeal swab, presence of viral mutation(s) within the areas targeted by this assay, and inadequate number of viral copies (<131 copies/mL). A negative result must be combined with clinical observations, patient history, and epidemiological information. The expected result is Negative. Fact Sheet for Patients:  PinkCheek.be Fact Sheet  for Healthcare Providers:  GravelBags.it This test is not yet ap proved or  cleared by the [place] and  has been authorized for detection and/or diagnosis of SARS-CoV-2 by FDA under an Emergency Use Authorization (EUA). This EUA will remain  in effect (meaning this test can be used) for the duration of the COVID-19 declaration under Section 564(b)(1) of the Act, 21 U.S.C. section 360bbb-3(b)(1), unless the authorization is terminated or revoked sooner.    Influenza A by PCR NEGATIVE NEGATIVE Final   Influenza B by PCR NEGATIVE NEGATIVE Final    Comment: (NOTE) The Xpert Xpress SARS-CoV-2/FLU/RSV assay is intended as an aid in  the diagnosis of influenza from Nasopharyngeal swab specimens and  should not be used as a sole basis for treatment. Nasal washings and  aspirates are unacceptable for Xpert Xpress SARS-CoV-2/FLU/RSV  testing. Fact Sheet for Patients: PinkCheek.be Fact Sheet for Healthcare Providers: GravelBags.it This test is not yet approved or cleared by the [place] FDA and  has been authorized for detection and/or diagnosis of SARS-CoV-2 by  FDA under an Emergency Use Authorization (EUA). This EUA will remain  in effect (meaning this test can be used) for the duration of the  Covid-19 declaration under Section 564(b)(1) of the Act, 21  U.S.C. section 360bbb-3(b)(1), unless the authorization is  terminated or revoked. Performed at [facility], [place] [address] [place], [place] [address]   Respiratory Panel by RT PCR (Flu A&B, Covid) - Nasopharyngeal Swab     Status: None   Collection Time: 07/19/19  6:57 AM   Specimen: Nasopharyngeal Swab  Result Value Ref Range Status   SARS Coronavirus 2 by RT PCR NEGATIVE NEGATIVE Final    Comment: (NOTE) SARS-CoV-2 target nucleic acids are NOT DETECTED. The SARS-CoV-2 RNA is generally detectable in upper  respiratoy specimens during the acute phase of infection. The lowest concentration of SARS-CoV-2 viral copies this assay can detect is 131 copies/mL. A negative result does not preclude SARS-Cov-2 infection and should not be used as the sole basis for treatment or other patient management decisions. A negative result may occur with  improper specimen collection/handling, submission of specimen other than nasopharyngeal swab, presence of viral mutation(s) within the areas targeted by this assay, and inadequate number of viral copies (<131 copies/mL). A negative result must be combined with clinical observations, patient history, and epidemiological information. The expected result is Negative. Fact Sheet for Patients:  PinkCheek.be Fact Sheet for Healthcare Providers:  GravelBags.it This test is not yet ap proved or cleared by the [place] FDA and  has been authorized for detection and/or diagnosis of SARS-CoV-2 by FDA under an Emergency Use Authorization (EUA). This EUA will remain  in effect (meaning this test can be used) for the duration of the COVID-19 declaration under Section 564(b)(1) of the Act, 21 U.S.C. section 360bbb-3(b)(1), unless the authorization is terminated or revoked sooner.    Influenza A by PCR NEGATIVE NEGATIVE Final   Influenza B by PCR NEGATIVE NEGATIVE Final    Comment: (NOTE) The Xpert Xpress SARS-CoV-2/FLU/RSV assay is intended as an aid in  the diagnosis of influenza from Nasopharyngeal swab specimens and  should not be used as a sole basis for treatment. Nasal washings and  aspirates are unacceptable for Xpert Xpress SARS-CoV-2/FLU/RSV  testing. Fact Sheet for Patients: PinkCheek.be Fact Sheet for Healthcare Providers: GravelBags.it This test is not yet approved or cleared by the [place] FDA and  has been authorized for  detection and/or diagnosis of SARS-CoV-2 by  FDA under an Emergency Use Authorization (EUA). This EUA  will remain  in effect (meaning this test can be used) for the duration of the  Covid-19 declaration under Section 564(b)(1) of the Act, 21  U.S.C. section 360bbb-3(b)(1), unless the authorization is  terminated or revoked. Performed at [facility], [place] [address], [place], [place] [address]      Labs: BNP (last 3 results) No results for input(s): BNP in the last 8760 hours. Basic Metabolic Panel: Recent Labs  Lab 07/14/19 0350 07/15/19 0514 07/19/19 0008 07/19/19 0803 07/20/19 0023  NA 140 135 137 138 132*  K 4.0 3.7 3.7 3.6 3.6  CL 104 102 104 104 98  CO2 24 24 25 24 25   GLUCOSE 88 92 100* 89 100*  BUN 11 11 7 8 7   CREATININE 0.63 0.73 0.65 0.65 0.60*  CALCIUM 8.5* 8.7* 8.3* 8.2* 8.7*  MG 2.3  --   --  2.0 1.9  PHOS 3.9  --   --  3.8  --    Liver Function Tests: Recent Labs  Lab 07/14/19 0350 07/15/19 0514 07/19/19 0008 07/19/19 0803 07/20/19 0023  AST 96* 95* 105* 88* 92*  ALT 27 28 29 29 29   ALKPHOS 159* 174* 205* 176* 165*  BILITOT 0.6 1.7* 0.6 0.9 2.0*  PROT 7.6 7.8 8.0 7.7 7.5  ALBUMIN 3.4* 3.2* 3.4* 3.4* 3.4*   Recent Labs  Lab 07/19/19 0008  LIPASE 73*   Recent Labs  Lab 07/15/19 0514  AMMONIA 48*   CBC: Recent Labs  Lab 07/13/19 2205 07/14/19 0350 07/19/19 0008 07/19/19 1207 07/19/19 1832 07/20/19 0023 07/20/19 0649  WBC 4.5   < > 4.0 3.2* 3.3* 3.7* 3.7*  NEUTROABS 2.1  --  1.2*  --   --   --   --   HGB 8.7*   < > 7.3* 7.3* 7.8* 7.9* 8.4*  HCT 29.3*   < > 24.3* 24.2* 25.9* 26.3* 27.6*  MCV 76.5*   < > 77.4* 77.3* 76.9* 77.4* 76.9*  PLT 128*   < > 105* 92* 90* 87* 95*   < > = values in this interval not displayed.   Cardiac Enzymes: No results for input(s): CKTOTAL, CKMB, CKMBINDEX, TROPONINI in the last 168 hours. BNP: Invalid input(s): POCBNP CBG: No results for input(s): GLUCAP in the last 168  hours. D-Dimer No results for input(s): DDIMER in the last 72 hours. Hgb A1c No results for input(s): HGBA1C in the last 72 hours. Lipid Profile No results for input(s): CHOL, HDL, LDLCALC, TRIG, CHOLHDL, LDLDIRECT in the last 72 hours. Thyroid function studies No results for input(s): TSH, T4TOTAL, T3FREE, THYROIDAB in the last 72 hours.  Invalid input(s): FREET3 Anemia work up No results for input(s): VITAMINB12, FOLATE, FERRITIN, TIBC, IRON, RETICCTPCT in the last 72 hours. Urinalysis    Component Value Date/Time   COLORURINE STRAW (A) 04/28/2019 1623   APPEARANCEUR CLEAR 04/28/2019 1623   LABSPEC 1.002 (L) 04/28/2019 1623   PHURINE 5.0 04/28/2019 1623   GLUCOSEU NEGATIVE 04/28/2019 1623   HGBUR SMALL (A) 04/28/2019 1623   BILIRUBINUR NEGATIVE 04/28/2019 1623   KETONESUR NEGATIVE 04/28/2019 1623   PROTEINUR NEGATIVE 04/28/2019 1623   NITRITE NEGATIVE 04/28/2019 1623   LEUKOCYTESUR NEGATIVE 04/28/2019 1623   Sepsis Labs Invalid input(s): PROCALCITONIN,  WBC,  LACTICIDVEN Microbiology Recent Results (from the past 240 hour(s))  Respiratory Panel by RT PCR (Flu A&B, Covid) - Nasopharyngeal Swab     Status: None   Collection Time: 07/14/19 12:32 AM   Specimen: Nasopharyngeal Swab  Result  Value Ref Range Status   SARS Coronavirus 2 by RT PCR NEGATIVE NEGATIVE Final    Comment: (NOTE) SARS-CoV-2 target nucleic acids are NOT DETECTED. The SARS-CoV-2 RNA is generally detectable in upper respiratoy specimens during the acute phase of infection. The lowest concentration of SARS-CoV-2 viral copies this assay can detect is 131 copies/mL. A negative result does not preclude SARS-Cov-2 infection and should not be used as the sole basis for treatment or other patient management decisions. A negative result may occur with  improper specimen collection/handling, submission of specimen other than nasopharyngeal swab, presence of viral mutation(s) within the areas targeted by this  assay, and inadequate number of viral copies (<131 copies/mL). A negative result must be combined with clinical observations, patient history, and epidemiological information. The expected result is Negative. Fact Sheet for Patients:  PinkCheek.be Fact Sheet for Healthcare Providers:  GravelBags.it This test is not yet ap proved or cleared by the [place] FDA and  has been authorized for detection and/or diagnosis of SARS-CoV-2 by FDA under an Emergency Use Authorization (EUA). This EUA will remain  in effect (meaning this test can be used) for the duration of the COVID-19 declaration under Section 564(b)(1) of the Act, 21 U.S.C. section 360bbb-3(b)(1), unless the authorization is terminated or revoked sooner.    Influenza A by PCR NEGATIVE NEGATIVE Final   Influenza B by PCR NEGATIVE NEGATIVE Final    Comment: (NOTE) The Xpert Xpress SARS-CoV-2/FLU/RSV assay is intended as an aid in  the diagnosis of influenza from Nasopharyngeal swab specimens and  should not be used as a sole basis for treatment. Nasal washings and  aspirates are unacceptable for Xpert Xpress SARS-CoV-2/FLU/RSV  testing. Fact Sheet for Patients: PinkCheek.be Fact Sheet for Healthcare Providers: GravelBags.it This test is not yet approved or cleared by the [place] FDA and  has been authorized for detection and/or diagnosis of SARS-CoV-2 by  FDA under an Emergency Use Authorization (EUA). This EUA will remain  in effect (meaning this test can be used) for the duration of the  Covid-19 declaration under Section 564(b)(1) of the Act, 21  U.S.C. section 360bbb-3(b)(1), unless the authorization is  terminated or revoked. Performed at [facility], [place] [address], [place], [place] [address]   Respiratory Panel by RT PCR (Flu A&B, Covid) - Nasopharyngeal Swab     Status: None    Collection Time: 07/19/19  6:57 AM   Specimen: Nasopharyngeal Swab  Result Value Ref Range Status   SARS Coronavirus 2 by RT PCR NEGATIVE NEGATIVE Final    Comment: (NOTE) SARS-CoV-2 target nucleic acids are NOT DETECTED. The SARS-CoV-2 RNA is generally detectable in upper respiratoy specimens during the acute phase of infection. The lowest concentration of SARS-CoV-2 viral copies this assay can detect is 131 copies/mL. A negative result does not preclude SARS-Cov-2 infection and should not be used as the sole basis for treatment or other patient management decisions. A negative result may occur with  improper specimen collection/handling, submission of specimen other than nasopharyngeal swab, presence of viral mutation(s) within the areas targeted by this assay, and inadequate number of viral copies (<131 copies/mL). A negative result must be combined with clinical observations, patient history, and epidemiological information. The expected result is Negative. Fact Sheet for Patients:  PinkCheek.be Fact Sheet for Healthcare Providers:  GravelBags.it This test is not yet ap proved or cleared by the [place] FDA and  has been authorized for detection and/or diagnosis of SARS-CoV-2 by FDA under an  Emergency Use Authorization (EUA). This EUA will remain  in effect (meaning this test can be used) for the duration of the COVID-19 declaration under Section 564(b)(1) of the Act, 21 U.S.C. section 360bbb-3(b)(1), unless the authorization is terminated or revoked sooner.    Influenza A by PCR NEGATIVE NEGATIVE Final   Influenza B by PCR NEGATIVE NEGATIVE Final    Comment: (NOTE) The Xpert Xpress SARS-CoV-2/FLU/RSV assay is intended as an aid in  the diagnosis of influenza from Nasopharyngeal swab specimens and  should not be used as a sole basis for treatment. Nasal washings and  aspirates are unacceptable for Xpert Xpress  SARS-CoV-2/FLU/RSV  testing. Fact Sheet for Patients: PinkCheek.be Fact Sheet for Healthcare Providers: GravelBags.it This test is not yet approved or cleared by the [place] FDA and  has been authorized for detection and/or diagnosis of SARS-CoV-2 by  FDA under an Emergency Use Authorization (EUA). This EUA will remain  in effect (meaning this test can be used) for the duration of the  Covid-19 declaration under Section 564(b)(1) of the Act, 21  U.S.C. section 360bbb-3(b)(1), unless the authorization is  terminated or revoked. Performed at [facility], [place] [address], [place], [place] [address]      Time coordinating discharge: 35 minutes  SIGNED:   Cordelia Poche, MD [facility] 07/20/2019, 3:22 PM

## 2019-07-21 ENCOUNTER — Encounter (HOSPITAL_COMMUNITY): Payer: Self-pay | Admitting: Emergency Medicine

## 2019-07-21 ENCOUNTER — Other Ambulatory Visit: Payer: Self-pay

## 2019-07-21 ENCOUNTER — Emergency Department (HOSPITAL_COMMUNITY)
Admission: EM | Admit: 2019-07-21 | Discharge: 2019-07-21 | Disposition: A | Discharge status: Home / Self Care | Payer: Medicaid Other | Attending: Emergency Medicine | Admitting: Emergency Medicine

## 2019-07-21 DIAGNOSIS — R45851 Suicidal ideations: Secondary | ICD-10-CM | POA: Insufficient documentation

## 2019-07-21 DIAGNOSIS — R45850 Homicidal ideations: Secondary | ICD-10-CM | POA: Insufficient documentation

## 2019-07-21 DIAGNOSIS — F17210 Nicotine dependence, cigarettes, uncomplicated: Secondary | ICD-10-CM | POA: Insufficient documentation

## 2019-07-21 DIAGNOSIS — F329 Major depressive disorder, single episode, unspecified: Secondary | ICD-10-CM | POA: Insufficient documentation

## 2019-07-21 DIAGNOSIS — Z79899 Other long term (current) drug therapy: Secondary | ICD-10-CM | POA: Insufficient documentation

## 2019-07-21 DIAGNOSIS — D509 Iron deficiency anemia, unspecified: Secondary | ICD-10-CM | POA: Insufficient documentation

## 2019-07-21 DIAGNOSIS — R7989 Other specified abnormal findings of blood chemistry: Secondary | ICD-10-CM

## 2019-07-21 DIAGNOSIS — D696 Thrombocytopenia, unspecified: Secondary | ICD-10-CM | POA: Insufficient documentation

## 2019-07-21 DIAGNOSIS — R569 Unspecified convulsions: Secondary | ICD-10-CM | POA: Insufficient documentation

## 2019-07-21 DIAGNOSIS — F1024 Alcohol dependence with alcohol-induced mood disorder: Secondary | ICD-10-CM | POA: Insufficient documentation

## 2019-07-21 DIAGNOSIS — I10 Essential (primary) hypertension: Secondary | ICD-10-CM | POA: Insufficient documentation

## 2019-07-21 DIAGNOSIS — F10920 Alcohol use, unspecified with intoxication, uncomplicated: Secondary | ICD-10-CM

## 2019-07-21 DIAGNOSIS — R440 Auditory hallucinations: Secondary | ICD-10-CM

## 2019-07-21 DIAGNOSIS — E871 Hypo-osmolality and hyponatremia: Secondary | ICD-10-CM | POA: Insufficient documentation

## 2019-07-21 DIAGNOSIS — Y908 Blood alcohol level of 240 mg/100 ml or more: Secondary | ICD-10-CM | POA: Insufficient documentation

## 2019-07-21 LAB — COMPREHENSIVE METABOLIC PANEL
ALT: 35 U/L (ref 0–44)
AST: 108 U/L — ABNORMAL HIGH (ref 15–41)
Albumin: 3.5 g/dL (ref 3.5–5.0)
Alkaline Phosphatase: 187 U/L — ABNORMAL HIGH (ref 38–126)
Anion gap: 11 (ref 5–15)
BUN: 5 mg/dL — ABNORMAL LOW (ref 6–20)
CO2: 21 mmol/L — ABNORMAL LOW (ref 22–32)
Calcium: 9.4 mg/dL (ref 8.9–10.3)
Chloride: 95 mmol/L — ABNORMAL LOW (ref 98–111)
Creatinine, Ser: 0.71 mg/dL (ref 0.61–1.24)
GFR calc Af Amer: >60 mL/min (ref >60)
GFR calc non Af Amer: >60 mL/min (ref >60)
Glucose, Bld: 116 mg/dL — ABNORMAL HIGH (ref 70–99)
Potassium: 3.4 mmol/L — ABNORMAL LOW (ref 3.5–5.1)
Sodium: 127 mmol/L — ABNORMAL LOW (ref 135–145)
Total Bilirubin: 0.8 mg/dL (ref 0.3–1.2)
Total Protein: 8.1 g/dL (ref 6.5–8.1)

## 2019-07-21 LAB — RAPID URINE DRUG SCREEN, HOSP PERFORMED
Amphetamines: NOT DETECTED
Barbiturates: NOT DETECTED
Benzodiazepines: NOT DETECTED
Cocaine: NOT DETECTED
Opiates: NOT DETECTED
Tetrahydrocannabinol: NOT DETECTED

## 2019-07-21 LAB — CBC
HCT: 29 % — ABNORMAL LOW (ref 39.0–52.0)
Hemoglobin: 8.6 g/dL — ABNORMAL LOW (ref 13.0–17.0)
MCH: 22.9 pg — ABNORMAL LOW (ref 26.0–34.0)
MCHC: 29.7 g/dL — ABNORMAL LOW (ref 30.0–36.0)
MCV: 77.3 fL — ABNORMAL LOW (ref 80.0–100.0)
Platelets: 105 10*3/uL — ABNORMAL LOW (ref 150–400)
RBC: 3.75 MIL/uL — ABNORMAL LOW (ref 4.22–5.81)
RDW: 23.8 % — ABNORMAL HIGH (ref 11.5–15.5)
WBC: 7.1 10*3/uL (ref 4.0–10.5)
nRBC: 0 % (ref 0.0–0.2)

## 2019-07-21 LAB — SALICYLATE LEVEL: Salicylate Lvl: <7 mg/dL — ABNORMAL LOW (ref 7.0–30.0)

## 2019-07-21 LAB — ETHANOL: Alcohol, Ethyl (B): 320 mg/dL (ref <10)

## 2019-07-21 LAB — ACETAMINOPHEN LEVEL: Acetaminophen (Tylenol), Serum: <10 ug/mL — ABNORMAL LOW (ref 10–30)

## 2019-07-21 MED ORDER — LORAZEPAM 1 MG PO TABS: 0.0000 mg | ORAL_TABLET | Freq: Two times a day (BID) | ORAL | Status: DC

## 2019-07-21 MED ORDER — LORAZEPAM 2 MG/ML IJ SOLN: 0.0000 mg | Freq: Four times a day (QID) | INTRAMUSCULAR | Status: DC

## 2019-07-21 MED ORDER — POTASSIUM CHLORIDE CRYS ER 20 MEQ PO TBCR: 40.0000 meq | EXTENDED_RELEASE_TABLET | Freq: Once | ORAL | Status: DC

## 2019-07-21 MED ORDER — LORAZEPAM 2 MG/ML IJ SOLN: 0.0000 mg | Freq: Two times a day (BID) | INTRAMUSCULAR | Status: DC

## 2019-07-21 MED ORDER — ALUM & MAG HYDROXIDE-SIMETH 200-200-20 MG/5ML PO SUSP: 30.0000 mL | Freq: Four times a day (QID) | ORAL | Status: DC | PRN

## 2019-07-21 MED ORDER — FOLIC ACID 1 MG PO TABS: 1.0000 mg | ORAL_TABLET | Freq: Every day | ORAL | Status: DC

## 2019-07-21 MED ORDER — ONDANSETRON HCL 4 MG PO TABS: 4.0000 mg | ORAL_TABLET | Freq: Three times a day (TID) | ORAL | Status: DC | PRN

## 2019-07-21 MED ORDER — ADULT MULTIVITAMIN W/MINERALS CH: 1.0000 | ORAL_TABLET | Freq: Every day | ORAL | Status: DC

## 2019-07-21 MED ORDER — PANTOPRAZOLE SODIUM 40 MG PO TBEC: 40.0000 mg | DELAYED_RELEASE_TABLET | Freq: Two times a day (BID) | ORAL | Status: DC

## 2019-07-21 MED ORDER — ACETAMINOPHEN 325 MG PO TABS: 650.0000 mg | ORAL_TABLET | ORAL | Status: DC | PRN

## 2019-07-21 MED ORDER — THIAMINE HCL 100 MG/ML IJ SOLN: 100.0000 mg | Freq: Every day | INTRAMUSCULAR | Status: DC

## 2019-07-21 MED ORDER — LORAZEPAM 1 MG PO TABS: 0.0000 mg | ORAL_TABLET | Freq: Four times a day (QID) | ORAL | Status: DC

## 2019-07-21 MED ORDER — NICOTINE 21 MG/24HR TD PT24: 21.0000 mg | MEDICATED_PATCH | Freq: Every day | TRANSDERMAL | Status: DC

## 2019-07-21 MED ORDER — THIAMINE HCL 100 MG PO TABS: 100.0000 mg | ORAL_TABLET | Freq: Every day | ORAL | Status: DC

## 2019-07-21 NOTE — ED Provider Notes (Addendum)
[facility] EMERGENCY DEPARTMENT Provider Note   CSN: [id number] Arrival date & time: 07/21/19  0020   History Chief Complaint  Patient presents with  . SI/HI/ETOH    Barry Moore is a 54 y.o. male.  The history is provided by the patient.  He has history of hypertension, alcohol abuse, seizure-like activity, GI bleeding, depression and comes in stating that he had seizure-like activity earlier tonight.  He does admit to drinking "a little" today, but states that he drank heavily yesterday.  He denies other drug use.  He does endorse homicidal ideation with desire to attack a specific person with a knife.  He denies suicidal ideation.  He does endorse auditory hallucinations.  He hears voices which tell him to attack a specific person.  Past Medical History:  Diagnosis Date  . Alcoholic hepatitis   . Depression   . ETOH abuse   . Gastric bleed 08/2018  . Hypertension   . Pancreatitis   . Seizures ([place])   . Suicidal behavior     Patient Active Problem List   Diagnosis Date Noted  . Nicotine dependence, cigarettes, uncomplicated [id number]  . Alcohol abuse with intoxication ([place]) 07/19/2019  . Orthostatic syncope 07/19/2019  . Upper GI bleeding 07/19/2019  . Acute upper GI bleeding 07/02/2019  . Duodenitis   . Acute upper GI bleed 01/01/2019  . Hypokalemia 01/01/2019  . Alcohol dependence ([place]) 01/01/2019  . Alcoholic liver disease ([place]) 01/01/2019  . Acute pancreatitis 12/13/2018  . Free intraperitoneal air 12/13/2018  . Homicidal ideation   . Gastrointestinal hemorrhage with melena 08/31/2018  . Colon ulcer   . Polyp of ascending colon   . Acute GI bleeding 08/05/2018  . Transaminitis 08/05/2018  . Abdominal pain 08/05/2018  . Nausea and vomiting 08/05/2018  . Substance induced mood disorder ([place]) 07/16/2017  . 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 Microcytic anemia 03/18/2017  . Acute blood loss  anemia 02/21/2017  . Upper GI bleed 02/08/2017  . Alcohol withdrawal ([place]) 02/08/2017  . Cocaine abuse ([place]) 11/18/2016  . GI bleed 11/01/2016  . Alcohol abuse   . 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 Seizure ([place]) 05/26/2016  . Tobacco abuse 05/26/2016  . Homeless 05/26/2016  . Essential hypertension 05/26/2016  . Alcohol dependence with alcohol-induced mood disorder ([place]) 02/14/2016  . Severe alcohol withdrawal without perceptual disturbances without complication ([place]) [id number]  . Alcoholic hepatitis without ascites 02/14/2016  . Thrombocytopenia ([place]) 02/14/2016    Past Surgical History:  Procedure Laterality Date  . BIOPSY  08/05/2018   Procedure: BIOPSY;  Surgeon: Rush Landmark Telford Nab., MD;  Location: [facility];  Service: Gastroenterology;;  . BIOPSY  08/07/2018   Procedure: BIOPSY;  Surgeon: Thornton Park, MD;  Location: [facility] ENDOSCOPY;  Service: Gastroenterology;;  . BIOPSY  01/02/2019   Procedure: BIOPSY;  Surgeon: Gatha Mayer, MD;  Location: [facility] ENDOSCOPY;  Service: Endoscopy;;  . COLONOSCOPY WITH PROPOFOL N/A 08/07/2018   Procedure: COLONOSCOPY WITH PROPOFOL;  Surgeon: Thornton Park, MD;  Location: [facility];  Service: Gastroenterology;  Laterality: N/A;  . ESOPHAGOGASTRODUODENOSCOPY N/A 01/02/2019   Procedure: ESOPHAGOGASTRODUODENOSCOPY (EGD);  Surgeon: Gatha Mayer, MD;  Location: [facility] ENDOSCOPY;  Service: Endoscopy;  Laterality: N/A;  . ESOPHAGOGASTRODUODENOSCOPY N/A 07/03/2019   Procedure: ESOPHAGOGASTRODUODENOSCOPY (EGD);  Surgeon: Wonda Horner, MD;  Location: [facility] ENDOSCOPY;  Service: Endoscopy;  Laterality: N/A;  . ESOPHAGOGASTRODUODENOSCOPY (EGD) WITH PROPOFOL  N/A 11/02/2016   Procedure: ESOPHAGOGASTRODUODENOSCOPY (EGD) WITH PROPOFOL;  Surgeon: Carol Ada, MD;  Location: [facility] ENDOSCOPY;  Service: Endoscopy;  Laterality: N/A;  .  ESOPHAGOGASTRODUODENOSCOPY (EGD) WITH PROPOFOL N/A 08/05/2018   Procedure: ESOPHAGOGASTRODUODENOSCOPY (EGD) WITH PROPOFOL;  Surgeon: Rush Landmark Telford Nab., MD;  Location: [facility];  Service: Gastroenterology;  Laterality: N/A;  . ESOPHAGOGASTRODUODENOSCOPY (EGD) WITH PROPOFOL N/A 07/14/2019   Procedure: ESOPHAGOGASTRODUODENOSCOPY (EGD) WITH PROPOFOL;  Surgeon: Otis Brace, MD;  Location: [facility] ENDOSCOPY;  Service: Gastroenterology;  Laterality: N/A;  . HERNIA REPAIR    . LEG SURGERY    . POLYPECTOMY  08/07/2018   Procedure: POLYPECTOMY;  Surgeon: Thornton Park, MD;  Location: [facility] ENDOSCOPY;  Service: Gastroenterology;;       Family History  Problem Relation Age of Onset  . Diabetes Mother   . Alcoholism Mother   . Emphysema Father   . Lung cancer Father   . Alcoholism Father     Social History   Tobacco Use  . Smoking status: Current Every Day Smoker    Packs/day: 1.00    Types: Cigarettes  . Smokeless tobacco: Never Used  Substance Use Topics  . Alcohol use: Yes    Comment: 5+ BEERS DAILY  . Drug use: Not Currently    Frequency: 3.0 times per week    Types: Cocaine    Home Medications Prior to Admission medications   Medication Sig Start Date End Date Taking? Authorizing Provider  amLODipine (NORVASC) 5 MG tablet Take 1 tablet (5 mg total) by mouth daily. 07/20/19 08/19/19  Mariel Aloe, MD  folic acid (FOLVITE) 1 MG tablet Take 1 tablet (1 mg total) by mouth daily. 07/16/19   Georgette Shell, MD  Multiple Vitamin (MULTIVITAMIN WITH MINERALS) TABS tablet Take 1 tablet by mouth daily. 07/16/19   Georgette Shell, MD  nicotine (NICODERM CQ - DOSED IN MG/24 HOURS) 21 mg/24hr patch Place 1 patch (21 mg total) onto the skin daily. 07/21/19   Mariel Aloe, MD  pantoprazole (PROTONIX) 40 MG tablet Take 1 tablet (40 mg total) by mouth 2 (two) times daily. 07/20/19   Mariel Aloe, MD  thiamine 100 MG tablet Take 1 tablet (100 mg total) by mouth daily. 07/16/19    Georgette Shell, MD  ferrous sulfate 325 (65 FE) MG tablet Take 1 tablet (325 mg total) by mouth daily with breakfast. Patient not taking: Reported on 09/23/2018 08/09/18 10/09/18  Elodia Florence., MD    Allergies    Tomato, Aspirin, and Sulfa antibiotics  Review of Systems   Review of Systems  All other systems reviewed and are negative.   Physical Exam Updated Vital Signs BP (!) 145/74 (BP Location: Right Arm)   Pulse 92   Temp 98.6 F (37 C)   Resp 18   Ht 5\' 8"  (1.727 m)   Wt 75 kg   SpO2 96%   BMI 25.14 kg/m   Physical Exam Vitals and nursing note reviewed.   54 year old male, resting comfortably and in no acute distress. Vital signs are significant for mildly elevated blood pressure. Oxygen saturation is 96%, which is normal. Head is normocephalic and atraumatic. PERRLA, EOMI. Oropharynx is clear. Neck is nontender and supple without adenopathy or JVD. Back is nontender and there is no CVA tenderness. Lungs are clear without rales, wheezes, or rhonchi. Chest is nontender. Heart has regular rate and rhythm without murmur. Abdomen is soft, flat, nontender without masses or hepatosplenomegaly and peristalsis is normoactive. Extremities  have no cyanosis or edema, full range of motion is present. Skin is warm and dry without rash. Neurologic: Awake, alert, oriented with normal speech, cranial nerves are intact, there are no motor or sensory deficits. Psychiatric: Homicidal ideation noted.  Auditory hallucinations present by history.  ED Results / Procedures / Treatments   Labs (all labs ordered are listed, but only abnormal results are displayed) Labs Reviewed  COMPREHENSIVE METABOLIC PANEL - Abnormal; Notable for the following components:      Result Value   Sodium 127 (*)    Potassium 3.4 (*)    Chloride 95 (*)    CO2 21 (*)    Glucose, Bld 116 (*)    BUN 5 (*)    AST 108 (*)    Alkaline Phosphatase 187 (*)    All other components within normal  limits  ETHANOL - Abnormal; Notable for the following components:   Alcohol, Ethyl (B) 320 (*)    All other components within normal limits  SALICYLATE LEVEL - Abnormal; Notable for the following components:   Salicylate Lvl [id number] (*)    All other components within normal limits  ACETAMINOPHEN LEVEL - Abnormal; Notable for the following components:   Acetaminophen (Tylenol), Serum <10 (*)    All other components within normal limits  CBC - Abnormal; Notable for the following components:   RBC 3.75 (*)    Hemoglobin 8.6 (*)    HCT 29.0 (*)    MCV 77.3 (*)    MCH 22.9 (*)    MCHC 29.7 (*)    RDW 23.8 (*)    Platelets 105 (*)    All other components within normal limits  RAPID URINE DRUG SCREEN, HOSP PERFORMED   Procedures Procedures  Medications Ordered in ED Medications  LORazepam (ATIVAN) injection 0-4 mg (has no administration in time range)    Or  LORazepam (ATIVAN) tablet 0-4 mg (has no administration in time range)  LORazepam (ATIVAN) injection 0-4 mg (has no administration in time range)    Or  LORazepam (ATIVAN) tablet 0-4 mg (has no administration in time range)  thiamine tablet 100 mg (has no administration in time range)    Or  thiamine (B-1) injection 100 mg (has no administration in time range)  acetaminophen (TYLENOL) tablet 650 mg (has no administration in time range)  ondansetron (ZOFRAN) tablet 4 mg (has no administration in time range)  alum & mag hydroxide-simeth (MAALOX/MYLANTA) 200-200-20 MG/5ML suspension 30 mL (has no administration in time range)  nicotine (NICODERM CQ - dosed in mg/24 hours) patch 21 mg (has no administration in time range)  folic acid (FOLVITE) tablet 1 mg (has no administration in time range)  multivitamin with minerals tablet 1 tablet (has no administration in time range)  pantoprazole (PROTONIX) EC tablet 40 mg (has no administration in time range)  potassium chloride SA (KLOR-CON) CR tablet 40 mEq (has no administration in time  range)    ED Course  I have reviewed the triage vital signs and the nursing notes.  Pertinent lab results that were available during my care of the patient were reviewed by me and considered in my medical decision making (see chart for details).  MDM Rules/Calculators/A&P Homicidal ideation with psychotic features.  Seizure-like activity.  Labs do show markedly elevated ethanol level as well as mild to moderate hyponatremia.  Anemia is present and unchanged from baseline.  Mild elevation of AST and alkaline phosphatase present and not significantly changed from prior.  Thrombocytopenia is present and  unchanged from prior.  Old records were reviewed and he has multiple ED visits for alcohol intoxication, seizure-like activity, depression or suicidal ideation.  At this point, he is felt to be medically cleared for psychiatric evaluation and will request TTS consultation.  In the past, seizure-like activity was thought to be secondary to substance abuse.  I agree with that assessment and do not feel that anticonvulsants need to be administered.  However, will place on CIWA protocol.  Final Clinical Impression(s) / ED Diagnoses Final diagnoses:  Homicidal ideation  Auditory hallucinations  Seizure-like activity ([place])  Alcohol intoxication, uncomplicated ([place])  Hyponatremia  Elevated liver function tests  Microcytic anemia  Thrombocytopenia ([place])    Rx / DC Orders ED Discharge Orders    None       Delora Fuel, MD [id number]  TTS consultation is appreciated.  Patient does not meet inpatient criteria and is discharged.    Delora Fuel, MD [id number] [phone]

## 2019-07-21 NOTE — ED Triage Notes (Signed)
Patient reports SI/HI with auditory and visual hallucinations , he did not disclose his plan of suicide at triage , intoxicated with ETOH .

## 2019-07-21 NOTE — ED Notes (Addendum)
States he is ready to leave. Denies SI/HI. ALL belongings - 2 labeled belongings bags and 1 valuables envelope - returned to pt - Pt verified all belongings present. D/C instructions given - questions answered to satisfaction. Pt declined breakfast, declined to sign for d/c paperwork.
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
## 2019-07-21 NOTE — ED Notes (Signed)
Moved Pt to room 52 for TTS

## 2019-07-23 ENCOUNTER — Telehealth: Payer: Self-pay

## 2019-07-23 NOTE — Telephone Encounter (Signed)
Transition Care Management Follow-up Telephone Call Date of discharge and from where: 07/20/2019, [facility].   07/21/2019 - ED  Patient does not have a phone and needs to schedule a follow up appointment.  A letter has been sent instructing him to contact this office to schedule the appointment.  The phone number for the [facility] is also on his AVS.

## 2019-07-24 ENCOUNTER — Encounter (HOSPITAL_COMMUNITY): Payer: Self-pay

## 2019-07-24 ENCOUNTER — Other Ambulatory Visit: Payer: Self-pay

## 2019-07-24 ENCOUNTER — Emergency Department (HOSPITAL_COMMUNITY)
Admission: EM | Admit: 2019-07-24 | Discharge: 2019-07-24 | Disposition: A | Discharge status: Home / Self Care | Payer: Medicaid Other | Attending: Emergency Medicine | Admitting: Emergency Medicine

## 2019-07-24 DIAGNOSIS — Z79899 Other long term (current) drug therapy: Secondary | ICD-10-CM | POA: Insufficient documentation

## 2019-07-24 DIAGNOSIS — F17210 Nicotine dependence, cigarettes, uncomplicated: Secondary | ICD-10-CM | POA: Insufficient documentation

## 2019-07-24 DIAGNOSIS — F10229 Alcohol dependence with intoxication, unspecified: Secondary | ICD-10-CM | POA: Insufficient documentation

## 2019-07-24 DIAGNOSIS — I10 Essential (primary) hypertension: Secondary | ICD-10-CM | POA: Insufficient documentation

## 2019-07-24 DIAGNOSIS — F10920 Alcohol use, unspecified with intoxication, uncomplicated: Secondary | ICD-10-CM

## 2019-07-24 LAB — CBC WITH DIFFERENTIAL/PLATELET
Abs Immature Granulocytes: 0 10*3/uL (ref 0.00–0.07)
Basophils Absolute: 0.1 10*3/uL (ref 0.0–0.1)
Basophils Relative: 2 %
Eosinophils Absolute: 0.2 10*3/uL (ref 0.0–0.5)
Eosinophils Relative: 4 %
HCT: 28.1 % — ABNORMAL LOW (ref 39.0–52.0)
Hemoglobin: 8.4 g/dL — ABNORMAL LOW (ref 13.0–17.0)
Immature Granulocytes: 0 %
Lymphocytes Relative: 36 %
Lymphs Abs: 1.5 10*3/uL (ref 0.7–4.0)
MCH: 23 pg — ABNORMAL LOW (ref 26.0–34.0)
MCHC: 29.9 g/dL — ABNORMAL LOW (ref 30.0–36.0)
MCV: 77 fL — ABNORMAL LOW (ref 80.0–100.0)
Monocytes Absolute: 0.6 10*3/uL (ref 0.1–1.0)
Monocytes Relative: 15 %
Neutro Abs: 1.8 10*3/uL (ref 1.7–7.7)
Neutrophils Relative %: 43 %
Platelets: 138 10*3/uL — ABNORMAL LOW (ref 150–400)
RBC: 3.65 MIL/uL — ABNORMAL LOW (ref 4.22–5.81)
RDW: 23.9 % — ABNORMAL HIGH (ref 11.5–15.5)
WBC: 4.2 10*3/uL (ref 4.0–10.5)
nRBC: 0 % (ref 0.0–0.2)

## 2019-07-24 LAB — URINALYSIS, ROUTINE W REFLEX MICROSCOPIC
Bilirubin Urine: NEGATIVE
Glucose, UA: NEGATIVE mg/dL
Hgb urine dipstick: NEGATIVE
Ketones, ur: NEGATIVE mg/dL
Leukocytes,Ua: NEGATIVE
Nitrite: NEGATIVE
Protein, ur: NEGATIVE mg/dL
Specific Gravity, Urine: 1.002 — ABNORMAL LOW (ref 1.005–1.030)
pH: 5 (ref 5.0–8.0)

## 2019-07-24 LAB — COMPREHENSIVE METABOLIC PANEL
ALT: 38 U/L (ref 0–44)
AST: 82 U/L — ABNORMAL HIGH (ref 15–41)
Albumin: 3.9 g/dL (ref 3.5–5.0)
Alkaline Phosphatase: 175 U/L — ABNORMAL HIGH (ref 38–126)
Anion gap: 11 (ref 5–15)
BUN: 7 mg/dL (ref 6–20)
CO2: 25 mmol/L (ref 22–32)
Calcium: 8.7 mg/dL — ABNORMAL LOW (ref 8.9–10.3)
Chloride: 105 mmol/L (ref 98–111)
Creatinine, Ser: 0.55 mg/dL — ABNORMAL LOW (ref 0.61–1.24)
GFR calc Af Amer: >60 mL/min (ref >60)
GFR calc non Af Amer: >60 mL/min (ref >60)
Glucose, Bld: 93 mg/dL (ref 70–99)
Potassium: 3.9 mmol/L (ref 3.5–5.1)
Sodium: 141 mmol/L (ref 135–145)
Total Bilirubin: 0.8 mg/dL (ref 0.3–1.2)
Total Protein: 8.5 g/dL — ABNORMAL HIGH (ref 6.5–8.1)

## 2019-07-24 LAB — RAPID URINE DRUG SCREEN, HOSP PERFORMED
Amphetamines: NOT DETECTED
Barbiturates: NOT DETECTED
Benzodiazepines: NOT DETECTED
Cocaine: NOT DETECTED
Opiates: NOT DETECTED
Tetrahydrocannabinol: NOT DETECTED

## 2019-07-24 LAB — ETHANOL: Alcohol, Ethyl (B): 355 mg/dL (ref <10)

## 2019-07-24 MED ORDER — SODIUM CHLORIDE 0.9 % IV BOLUS: 1000.0000 mL | Freq: Once | INTRAVENOUS | Status: DC
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
## 2019-07-24 NOTE — TOC Initial Note (Signed)
Transition of Care [facility]) - Initial/Assessment Note    Patient Details  Name: Barry Moore MRN: [id number] Date of Birth: 10-23-1965  [facility]) CM/SW Contact:    Erenest Rasher, RN Phone Number: [phone] 07/24/2019, 3:29 PM  Clinical Narrative:                 TOC CM spoke to pt at bedside. 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 States he buys food and his alcohol with the money he collects on the street. [place]. He feels now he could use some help with his drinking and agrees to go to a IP substance abuse program. ED provider updated. Will have Peer Support follow up with patient.     Expected Discharge Plan: Homeless Shelter Barriers to Discharge: ED Active Substance Abuse   Patient Goals and CMS Choice Patient states their goals for this hospitalization and ongoing recovery are:: want to go to a program that can help me with drinking      Expected Discharge Plan and Services Expected Discharge Plan: Homeless Shelter In-house Referral: Clinical Social Work Discharge Planning Services: CM Consult   Living arrangements for the past 2 months: Homeless                                      Prior Living Arrangements/Services Living arrangements for the past 2 months: Homeless Lives with:: Self Patient language and need for interpreter reviewed:: Yes        Need for Family Participation in Patient Care: No (Comment) Care giver support system in place?: No (comment)   Criminal Activity/Legal Involvement Pertinent to Current Situation/Hospitalization: No - Comment as needed  Activities of Daily Living      Permission Sought/Granted Permission sought to share information with : Case Manager, PCP                Emotional Assessment Appearance:: Appears older than stated age Attitude/Demeanor/Rapport: Engaged Affect (typically observed): Accepting Orientation: : Oriented to Self,  Oriented to Place, Oriented to  Time, Oriented to Situation   Psych Involvement: No (comment)  Admission diagnosis:  Alcohol Intoxication Patient Active Problem List   Diagnosis Date Noted  . Nicotine dependence, cigarettes, uncomplicated [id number]  . Alcohol abuse with intoxication ([place]) 07/19/2019  . Orthostatic syncope 07/19/2019  . Upper GI bleeding 07/19/2019  . Acute upper GI bleeding 07/02/2019  . Duodenitis   . Acute upper GI bleed 01/01/2019  . Hypokalemia 01/01/2019  . Alcohol dependence ([place]) 01/01/2019  . Alcoholic liver disease ([place]) 01/01/2019  . Acute pancreatitis 12/13/2018  . Free intraperitoneal air 12/13/2018  . Homicidal ideation   . Gastrointestinal hemorrhage with melena 08/31/2018  . Colon ulcer   . Polyp of ascending colon   . Acute GI bleeding 08/05/2018  . Transaminitis 08/05/2018  . Abdominal pain 08/05/2018  . Nausea and vomiting 08/05/2018  . Substance induced mood disorder ([place]) 07/16/2017  . Severe recurrent major depression without psychotic features ([place]) 05/15/2017  . Microcytic anemia 03/18/2017  . Acute blood loss anemia 02/21/2017  . Upper GI bleed 02/08/2017  . Alcohol withdrawal ([place]) 02/08/2017  . Cocaine abuse ([place]) 11/18/2016  . GI bleed 11/01/2016  . Alcohol abuse   . Major depressive disorder, recurrent severe without psychotic features ([place]) 07/06/2016  . Alcohol-induced mood disorder ([place]) 06/15/2016  . Seizure (  [place]) 05/26/2016  . Tobacco abuse 05/26/2016  . Homeless 05/26/2016  . Essential hypertension 05/26/2016  . Alcohol dependence with alcohol-induced mood disorder ([place]) 02/14/2016  . Severe alcohol withdrawal without perceptual disturbances without complication ([place]) [id number]  . Alcoholic hepatitis without ascites 02/14/2016  . Thrombocytopenia ([place]) 02/14/2016   PCP:  Antony Blackbird, MD Pharmacy:   [name] [place] [place] [address] [place]  [address] Phone: [phone] Fax: [phone]     Social Determinants of Health ([place]) Interventions    Readmission Risk Interventions Readmission Risk Prevention Plan 07/20/2019 01/03/2019 09/01/2018  Transportation Screening Complete Complete Complete  PCP or Specialist Appt within 5-7 Days - - -  Home Care Screening - - -  Medication Review (RN CM) - - -  Medication Review ([place]) Complete Complete Complete  PCP or Specialist appointment within 3-5 days of discharge - Not Complete Complete  PCP/Specialist Appt Not Complete comments - Got first available appointments -  [place] or [place] - Not Complete Complete  SW Recovery Care/Counseling Consult Complete Not Complete Complete  Palliative Care Screening Not Applicable Not Applicable Not Applicable  Skilled Nursing Facility Not Applicable Not Applicable Not Applicable  Some recent data might be hidden

## 2019-07-24 NOTE — ED Notes (Signed)
Pt indicated he did not want IV or IV fluids but agreed to drink, given food tray, sandwich and 600 ml water along with food.

## 2019-07-24 NOTE — ED Provider Notes (Signed)
Blood pressure 131/79, pulse 89, temperature 97.9 F (36.6 C), temperature source Oral, resp. rate 16, SpO2 96 %.  Assuming care from Dr. Roslynn Amble.  In short, Barry Moore is a 54 y.o. male with a chief complaint of Alcohol Intoxication .  Refer to the original H&P for additional details.  The current plan of care is to f/u after PEER support consultation.  PEER support note reviewed. Patient ready for discharge.     Margette Fast, MD 07/24/19 Barry Moore

## 2019-07-24 NOTE — Discharge Instructions (Addendum)
Recommend follow-up with your primary doctor. If you develop chest pain, difficulty breathing, vomiting or abdominal pain, return to ER.

## 2019-07-24 NOTE — Patient Outreach (Addendum)
CPSS met with the patient to provide substance use recovery support and provide help with getting connected to substance use treatment resources. Patient reports a history of daily alcohol use. Patient's UDS was negative. Patient's EtOH test was positive. Patient is currently experiencing homelessness. Patient is requesting help with a detox/crisis center referral. Patient reports no prior substance use treatment admissions within the past year. [facility], [facility], and [facility] all did not have available male detox beds today. [facility] located in [place], [place] currently has crisis beds available. Patient is agreeable to going to [facility] located in [place], [place] today. Patient will arrive to this specific crisis center via safe transportation services. CPSS also provided the patient with information for several additional substance use recovery resources including detox treatment center list, residential/outpatient substance use treatment center list, online/in-person AA meeting list, and CPSS contact information. CPSS strongly encouraged the patient to follow up with CPSS if needed for further help with getting connected to substance use treatment resources.

## 2019-11-24 ENCOUNTER — Other Ambulatory Visit: Payer: Self-pay

## 2019-11-24 ENCOUNTER — Emergency Department (HOSPITAL_COMMUNITY)
Admission: EM | Admit: 2019-11-24 | Discharge: 2019-11-25 | Disposition: A | Discharge status: Home / Self Care | Payer: Medicaid Other | Attending: Emergency Medicine | Admitting: Emergency Medicine

## 2019-11-24 ENCOUNTER — Encounter (HOSPITAL_COMMUNITY): Payer: Self-pay

## 2019-11-24 ENCOUNTER — Ambulatory Visit (HOSPITAL_COMMUNITY)
Admission: EM | Admit: 2019-11-24 | Discharge: 2019-11-24 | Disposition: A | Discharge status: Other Acute Inpatient Hospital | Payer: No Payment, Other | Attending: Psychiatry | Admitting: Psychiatry

## 2019-11-24 DIAGNOSIS — F10129 Alcohol abuse with intoxication, unspecified: Secondary | ICD-10-CM | POA: Insufficient documentation

## 2019-11-24 DIAGNOSIS — I10 Essential (primary) hypertension: Secondary | ICD-10-CM | POA: Insufficient documentation

## 2019-11-24 DIAGNOSIS — F10220 Alcohol dependence with intoxication, uncomplicated: Secondary | ICD-10-CM

## 2019-11-24 DIAGNOSIS — F17210 Nicotine dependence, cigarettes, uncomplicated: Secondary | ICD-10-CM | POA: Insufficient documentation

## 2019-11-24 DIAGNOSIS — F10920 Alcohol use, unspecified with intoxication, uncomplicated: Secondary | ICD-10-CM

## 2019-11-24 NOTE — ED Provider Notes (Signed)
[facility] DEPT Provider Note   CSN: [id number] Arrival date & time: 11/24/19  2144     History Chief Complaint  Patient presents with  . Alcohol Intoxication    Barry Moore is a 54 y.o. male.  Patient to ED for alcohol detox. He initially went to [facility] but was sent to the ED secondary to severe alcohol intoxication. No vomiting. He denies pain. History of multiple visits for same.   The history is provided by the patient. No language interpreter was used.       Past Medical History:  Diagnosis Date  . Alcoholic hepatitis   . Depression   . ETOH abuse   . Gastric bleed 08/2018  . Hypertension   . Pancreatitis   . Seizures ([place])   . Suicidal behavior     Patient Active Problem List   Diagnosis Date Noted  . Nicotine dependence, cigarettes, uncomplicated 55/31/4970  . Alcohol abuse with intoxication ([place]) 07/19/2019  . Orthostatic syncope 07/19/2019  . Upper GI bleeding 07/19/2019  . Acute upper GI bleeding 07/02/2019  . Duodenitis   . Acute upper GI bleed 01/01/2019  . Hypokalemia 01/01/2019  . Alcohol dependence ([place]) 01/01/2019  . Alcoholic liver disease ([place]) 01/01/2019  . Acute pancreatitis 12/13/2018  . Free intraperitoneal air 12/13/2018  . Homicidal ideation   . Gastrointestinal hemorrhage with melena 08/31/2018  . Colon ulcer   . Polyp of ascending colon   . Acute GI bleeding 08/05/2018  . Transaminitis 08/05/2018  . Abdominal pain 08/05/2018  . Nausea and vomiting 08/05/2018  . Substance induced mood disorder ([place]) 07/16/2017  . Severe recurrent major depression without psychotic features ([place]) 05/15/2017  . Microcytic anemia 03/18/2017  . Acute blood loss anemia 02/21/2017  . Upper GI bleed 02/08/2017  . Alcohol withdrawal ([place]) 02/08/2017  . Cocaine abuse ([place]) 11/18/2016  . GI bleed 11/01/2016  . Alcohol abuse   . Major depressive disorder, recurrent severe without psychotic features ([place]) 07/06/2016  .  Alcohol-induced mood disorder ([place]) 06/15/2016  . Seizure ([place]) 05/26/2016  . Tobacco abuse 05/26/2016  . Homeless 05/26/2016  . Essential hypertension 05/26/2016  . Alcohol dependence with alcohol-induced mood disorder ([place]) 02/14/2016  . Severe alcohol withdrawal without perceptual disturbances without complication ([place]) 26/37/8588  . Alcoholic hepatitis without ascites 02/14/2016  . Thrombocytopenia ([place]) 02/14/2016    Past Surgical History:  Procedure Laterality Date  . BIOPSY  08/05/2018   Procedure: BIOPSY;  Surgeon: Rush Landmark Telford Nab., MD;  Location: [facility];  Service: Gastroenterology;;  . BIOPSY  08/07/2018   Procedure: BIOPSY;  Surgeon: Thornton Park, MD;  Location: [facility] ENDOSCOPY;  Service: Gastroenterology;;  . BIOPSY  01/02/2019   Procedure: BIOPSY;  Surgeon: Gatha Mayer, MD;  Location: [facility] ENDOSCOPY;  Service: Endoscopy;;  . COLONOSCOPY WITH PROPOFOL N/A 08/07/2018   Procedure: COLONOSCOPY WITH PROPOFOL;  Surgeon: Thornton Park, MD;  Location: [facility];  Service: Gastroenterology;  Laterality: N/A;  . ESOPHAGOGASTRODUODENOSCOPY N/A 01/02/2019   Procedure: ESOPHAGOGASTRODUODENOSCOPY (EGD);  Surgeon: Gatha Mayer, MD;  Location: [facility];  Service: Endoscopy;  Laterality: N/A;  . ESOPHAGOGASTRODUODENOSCOPY N/A 07/03/2019   Procedure: ESOPHAGOGASTRODUODENOSCOPY (EGD);  Surgeon: Wonda Horner, MD;  Location: [facility];  Service: Endoscopy;  Laterality: N/A;  . ESOPHAGOGASTRODUODENOSCOPY (EGD) WITH PROPOFOL N/A 11/02/2016   Procedure: ESOPHAGOGASTRODUODENOSCOPY (EGD) WITH PROPOFOL;  Surgeon: Carol Ada, MD;  Location: [facility] ENDOSCOPY;  Service: Endoscopy;  Laterality: N/A;  . ESOPHAGOGASTRODUODENOSCOPY (EGD) WITH PROPOFOL N/A 08/05/2018   Procedure: ESOPHAGOGASTRODUODENOSCOPY (EGD) WITH  PROPOFOL;  Surgeon: Irving Copas., MD;  Location: [facility];  Service: Gastroenterology;  Laterality: N/A;  . ESOPHAGOGASTRODUODENOSCOPY (EGD) WITH  PROPOFOL N/A 07/14/2019   Procedure: ESOPHAGOGASTRODUODENOSCOPY (EGD) WITH PROPOFOL;  Surgeon: Otis Brace, MD;  Location: [facility] ENDOSCOPY;  Service: Gastroenterology;  Laterality: N/A;  . HERNIA REPAIR    . LEG SURGERY    . POLYPECTOMY  08/07/2018   Procedure: POLYPECTOMY;  Surgeon: Thornton Park, MD;  Location: [facility] ENDOSCOPY;  Service: Gastroenterology;;       Family History  Problem Relation Age of Onset  . Diabetes Mother   . Alcoholism Mother   . Emphysema Father   . Lung cancer Father   . Alcoholism Father     Social History   Tobacco Use  . Smoking status: Current Every Day Smoker    Packs/day: 1.00    Types: Cigarettes  . Smokeless tobacco: Never Used  Vaping Use  . Vaping Use: Never used  Substance Use Topics  . Alcohol use: Yes    Comment: 5+ BEERS DAILY, Patient does not know how much he drank tonight  . Drug use: Not Currently    Frequency: 3.0 times per week    Types: Cocaine    Home Medications Prior to Admission medications   Medication Sig Start Date End Date Taking? Authorizing Provider  amLODipine (NORVASC) 5 MG tablet Take 1 tablet (5 mg total) by mouth daily. 07/20/19 08/19/19  Mariel Aloe, MD  folic acid (FOLVITE) 1 MG tablet Take 1 tablet (1 mg total) by mouth daily. 07/16/19   Georgette Shell, MD  Multiple Vitamin (MULTIVITAMIN WITH MINERALS) TABS tablet Take 1 tablet by mouth daily. 07/16/19   Georgette Shell, MD  nicotine (NICODERM CQ - DOSED IN MG/24 HOURS) 21 mg/24hr patch Place 1 patch (21 mg total) onto the skin daily. 07/21/19   Mariel Aloe, MD  pantoprazole (PROTONIX) 40 MG tablet Take 1 tablet (40 mg total) by mouth 2 (two) times daily. 07/20/19   Mariel Aloe, MD  thiamine 100 MG tablet Take 1 tablet (100 mg total) by mouth daily. 07/16/19   Georgette Shell, MD  ferrous sulfate 325 (65 FE) MG tablet Take 1 tablet (325 mg total) by mouth daily with breakfast. Patient not taking: Reported on 09/23/2018 08/09/18 10/09/18   Elodia Florence., MD    Allergies    Tomato, Aspirin, and Sulfa antibiotics  Review of Systems   Review of Systems  Unable to perform ROS: Other (significant alcohol intoxication)    Physical Exam Updated Vital Signs BP 116/63 (BP Location: Right Arm)   Pulse 86   Temp 98.7 F (37.1 C) (Oral)   Resp 16   Ht 5\' 8"  (1.727 m)   Wt 75 kg   SpO2 99%   BMI 25.14 kg/m   Physical Exam Vitals and nursing note reviewed.  Constitutional:      General: He is in acute distress.     Comments: Acutely intoxicated.  HENT:     Head: Atraumatic.  Cardiovascular:     Rate and Rhythm: Normal rate and regular rhythm.     Heart sounds: No murmur heard.   Pulmonary:     Effort: Pulmonary effort is normal.     Breath sounds: No wheezing, rhonchi or rales.  Abdominal:     General: There is no distension.     Tenderness: There is no abdominal tenderness.  Musculoskeletal:        General: Normal range of  motion.     Cervical back: Normal range of motion and neck supple.  Skin:    General: Skin is warm and dry.     ED Results / Procedures / Treatments   Labs (all labs ordered are listed, but only abnormal results are displayed) Labs Reviewed - No data to display  EKG None  Radiology No results found.  Procedures Procedures (including critical care time)  Medications Ordered in ED Medications - No data to display  ED Course  I have reviewed the triage vital signs and the nursing notes.  Pertinent labs & imaging results that were available during my care of the patient were reviewed by me and considered in my medical decision making (see chart for details).    MDM Rules/Calculators/A&P                          Patient to ED, acutely intoxicated, having been to [facility] requesting detox. History of same. Patient is familiar to ED with multiple previous visits for intoxication.   VSS. He is easily aroused. No hypoxia. He is protecting his airway. Will observe until  clinically sober and safe for discharge.   Patient remains stable throughout a 9 hour observation. On re- exam, he is easily awaken. He is receptive to receiving outpatient resources for alcohol detox. VSS. He can be discharged.   Final Clinical Impression(s) / ED Diagnoses Final diagnoses:  None   1. Chronic alcoholism with acute intoxication  Rx / DC Orders ED Discharge Orders    None       Charlann Lange, Hershal Coria 11/25/19 [id number]    Lacretia Leigh, MD 11/25/19 [phone]

## 2019-11-24 NOTE — ED Triage Notes (Signed)
Patient brought to ED via [facility] for intoxication. EMS [facility]. Patient transferred here due to severe ETOH intoxication. Patient reports drinking beer and bourbon today but does not know how much.

## 2019-11-24 NOTE — ED Notes (Signed)
Pt repeated numerous times to this writer while obtaining vital signs "I want to die. I want to die."

## 2019-11-24 NOTE — ED Notes (Signed)
As per NP Caroline Sauger, EMS, non emergent transport requested for transfer to [facility] for Medical Clearance.

## 2019-11-25 NOTE — ED Notes (Signed)
Patient has been talking to himself off and on between naps

## 2019-11-28 ENCOUNTER — Other Ambulatory Visit: Payer: Self-pay

## 2019-11-28 ENCOUNTER — Encounter (HOSPITAL_COMMUNITY): Payer: Self-pay

## 2019-11-28 DIAGNOSIS — F17210 Nicotine dependence, cigarettes, uncomplicated: Secondary | ICD-10-CM | POA: Insufficient documentation

## 2019-11-28 DIAGNOSIS — F10129 Alcohol abuse with intoxication, unspecified: Secondary | ICD-10-CM | POA: Insufficient documentation

## 2019-11-28 DIAGNOSIS — R569 Unspecified convulsions: Secondary | ICD-10-CM | POA: Insufficient documentation

## 2019-11-28 DIAGNOSIS — R110 Nausea: Secondary | ICD-10-CM | POA: Insufficient documentation

## 2019-11-28 DIAGNOSIS — I10 Essential (primary) hypertension: Secondary | ICD-10-CM | POA: Insufficient documentation

## 2019-11-28 NOTE — ED Triage Notes (Signed)
Pt called EMS due to having an unwitnessed seizure that occurred around 2200. Pt states he is drunk and crazy and states he drank 1 case of beer today - pt states he going to drink himself to death. Pt states his last seizure was 1 month ago. Pt endorses nausea and abdominal pain, denies V/D.

## 2019-11-29 ENCOUNTER — Emergency Department (HOSPITAL_COMMUNITY)
Admission: EM | Admit: 2019-11-29 | Discharge: 2019-11-29 | Disposition: A | Discharge status: Home / Self Care | Payer: Self-pay | Attending: Emergency Medicine | Admitting: Emergency Medicine

## 2019-11-29 ENCOUNTER — Emergency Department (HOSPITAL_COMMUNITY): Payer: Self-pay

## 2019-11-29 DIAGNOSIS — F10920 Alcohol use, unspecified with intoxication, uncomplicated: Secondary | ICD-10-CM

## 2019-11-29 LAB — COMPREHENSIVE METABOLIC PANEL
ALT: 21 U/L (ref 0–44)
AST: 39 U/L (ref 15–41)
Albumin: 4.3 g/dL (ref 3.5–5.0)
Alkaline Phosphatase: 116 U/L (ref 38–126)
Anion gap: 12 (ref 5–15)
BUN: 7 mg/dL (ref 6–20)
CO2: 27 mmol/L (ref 22–32)
Calcium: 8.9 mg/dL (ref 8.9–10.3)
Chloride: 102 mmol/L (ref 98–111)
Creatinine, Ser: 0.64 mg/dL (ref 0.61–1.24)
GFR calc Af Amer: >60 mL/min (ref >60)
GFR calc non Af Amer: >60 mL/min (ref >60)
Glucose, Bld: 95 mg/dL (ref 70–99)
Potassium: 3.8 mmol/L (ref 3.5–5.1)
Sodium: 141 mmol/L (ref 135–145)
Total Bilirubin: 0.6 mg/dL (ref 0.3–1.2)
Total Protein: 8.6 g/dL — ABNORMAL HIGH (ref 6.5–8.1)

## 2019-11-29 LAB — CBC
HCT: 30.5 % — ABNORMAL LOW (ref 39.0–52.0)
Hemoglobin: 9.4 g/dL — ABNORMAL LOW (ref 13.0–17.0)
MCH: 22.9 pg — ABNORMAL LOW (ref 26.0–34.0)
MCHC: 30.8 g/dL (ref 30.0–36.0)
MCV: 74.4 fL — ABNORMAL LOW (ref 80.0–100.0)
Platelets: 180 10*3/uL (ref 150–400)
RBC: 4.1 MIL/uL — ABNORMAL LOW (ref 4.22–5.81)
RDW: 20.6 % — ABNORMAL HIGH (ref 11.5–15.5)
WBC: 5.6 10*3/uL (ref 4.0–10.5)
nRBC: 0 % (ref 0.0–0.2)

## 2019-11-29 LAB — ETHANOL: Alcohol, Ethyl (B): 326 mg/dL (ref <10)

## 2019-11-29 NOTE — ED Notes (Signed)
Patient has been awaiting peer support.  Can discharge at this time per Dr. Langston Masker at this time.  Discussed discharge instructions and medications with patient. All questions addressed.

## 2019-11-29 NOTE — ED Provider Notes (Signed)
Emergency Department Provider Note   I have reviewed the triage vital signs and the nursing notes.   HISTORY  Chief Complaint Seizures and Alcohol Intoxication   HPI Barry Moore is a 54 y.o. male with past medical history reviewed below presents to the emergency department after apparently having a seizure.  The seizure activity was unwitnessed but reported by the patient.  He reports drinking heavily today as is typical for him.  He denies any pain to me although in triage reported some abdominal discomfort.  He does report some nausea but no vomiting or diarrhea. Denies any SI to me although alluded to "drinking himself to death" in triage.   Level 5 caveat: Alcohol intoxication.   Past Medical History:  Diagnosis Date  . Alcoholic hepatitis   . Depression   . ETOH abuse   . Gastric bleed 08/2018  . Hypertension   . Pancreatitis   . Seizures ([place])   . Suicidal behavior     Patient Active Problem List   Diagnosis Date Noted  . Nicotine dependence, cigarettes, uncomplicated 17/40/8144  . Alcohol abuse with intoxication ([place]) 07/19/2019  . Orthostatic syncope 07/19/2019  . Upper GI bleeding 07/19/2019  . Acute upper GI bleeding 07/02/2019  . Duodenitis   . Acute upper GI bleed 01/01/2019  . Hypokalemia 01/01/2019  . Alcohol dependence ([place]) 01/01/2019  . Alcoholic liver disease ([place]) 01/01/2019  . Acute pancreatitis 12/13/2018  . Free intraperitoneal air 12/13/2018  . Homicidal ideation   . Gastrointestinal hemorrhage with melena 08/31/2018  . Colon ulcer   . Polyp of ascending colon   . Acute GI bleeding 08/05/2018  . Transaminitis 08/05/2018  . Abdominal pain 08/05/2018  . Nausea and vomiting 08/05/2018  . Substance induced mood disorder ([place]) 07/16/2017  . Severe recurrent major depression without psychotic features ([place]) 05/15/2017  . Microcytic anemia 03/18/2017  . Acute blood loss anemia 02/21/2017  . Upper GI bleed 02/08/2017  . Alcohol withdrawal  ([place]) 02/08/2017  . Cocaine abuse ([place]) 11/18/2016  . GI bleed 11/01/2016  . Alcohol abuse   . Major depressive disorder, recurrent severe without psychotic features ([place]) 07/06/2016  . Alcohol-induced mood disorder ([place]) 06/15/2016  . Seizure ([place]) 05/26/2016  . Tobacco abuse 05/26/2016  . Homeless 05/26/2016  . Essential hypertension 05/26/2016  . Alcohol dependence with alcohol-induced mood disorder ([place]) 02/14/2016  . Severe alcohol withdrawal without perceptual disturbances without complication ([place]) 81/85/6314  . Alcoholic hepatitis without ascites 02/14/2016  . Thrombocytopenia ([place]) 02/14/2016    Past Surgical History:  Procedure Laterality Date  . BIOPSY  08/05/2018   Procedure: BIOPSY;  Surgeon: Rush Landmark Telford Nab., MD;  Location: [facility];  Service: Gastroenterology;;  . BIOPSY  08/07/2018   Procedure: BIOPSY;  Surgeon: Thornton Park, MD;  Location: [facility] ENDOSCOPY;  Service: Gastroenterology;;  . BIOPSY  01/02/2019   Procedure: BIOPSY;  Surgeon: Gatha Mayer, MD;  Location: [facility] ENDOSCOPY;  Service: Endoscopy;;  . COLONOSCOPY WITH PROPOFOL N/A 08/07/2018   Procedure: COLONOSCOPY WITH PROPOFOL;  Surgeon: Thornton Park, MD;  Location: [facility];  Service: Gastroenterology;  Laterality: N/A;  . ESOPHAGOGASTRODUODENOSCOPY N/A 01/02/2019   Procedure: ESOPHAGOGASTRODUODENOSCOPY (EGD);  Surgeon: Gatha Mayer, MD;  [facility];  Service: Endoscopy;  Laterality: N/A;  . ESOPHAGOGASTRODUODENOSCOPY N/A 07/03/2019   Procedure: ESOPHAGOGASTRODUODENOSCOPY (EGD);  Surgeon: Wonda Horner, MD;  Location: [facility];  Service: Endoscopy;  Laterality: N/A;  . ESOPHAGOGASTRODUODENOSCOPY (EGD) WITH PROPOFOL N/A 11/02/2016   Procedure: ESOPHAGOGASTRODUODENOSCOPY (EGD) WITH PROPOFOL;  Surgeon:  Carol Ada, MD;  Location: [facility];  Service: Endoscopy;  Laterality: N/A;  . ESOPHAGOGASTRODUODENOSCOPY (EGD) WITH PROPOFOL N/A 08/05/2018   Procedure:  ESOPHAGOGASTRODUODENOSCOPY (EGD) WITH PROPOFOL;  Surgeon: Rush Landmark Telford Nab., MD;  Location: [facility];  Service: Gastroenterology;  Laterality: N/A;  . ESOPHAGOGASTRODUODENOSCOPY (EGD) WITH PROPOFOL N/A 07/14/2019   Procedure: ESOPHAGOGASTRODUODENOSCOPY (EGD) WITH PROPOFOL;  Surgeon: Otis Brace, MD;  Location: [facility] ENDOSCOPY;  Service: Gastroenterology;  Laterality: N/A;  . HERNIA REPAIR    . LEG SURGERY    . POLYPECTOMY  08/07/2018   Procedure: POLYPECTOMY;  Surgeon: Thornton Park, MD;  Location: [facility] ENDOSCOPY;  Service: Gastroenterology;;    Allergies Tomato, Aspirin, and Sulfa antibiotics  Family History  Problem Relation Age of Onset  . Diabetes Mother   . Alcoholism Mother   . Emphysema Father   . Lung cancer Father   . Alcoholism Father     Social History Social History   Tobacco Use  . Smoking status: Current Every Day Smoker    Packs/day: 1.00    Types: Cigarettes  . Smokeless tobacco: Never Used  Vaping Use  . Vaping Use: Never used  Substance Use Topics  . Alcohol use: Yes    Comment: 5+ BEERS DAILY, Patient does not know how much he drank tonight  . Drug use: Not Currently    Frequency: 3.0 times per week    Types: Cocaine    Review of Systems  Constitutional: No fever/chills Eyes: No visual changes. ENT: No sore throat. Cardiovascular: Denies chest pain. Respiratory: Denies shortness of breath. Gastrointestinal: No abdominal pain. Positive nausea, no vomiting.  No diarrhea.  No constipation. Genitourinary: Negative for dysuria. Musculoskeletal: Negative for back pain. Skin: Negative for rash. Neurological: Negative for headaches, focal weakness or numbness. Reported seizure activity.   10-point ROS otherwise negative.  ____________________________________________   PHYSICAL EXAM:  VITAL SIGNS: ED Triage Vitals  Enc Vitals Group     BP 11/28/19 2317 132/76     Pulse Rate 11/28/19 2317 96     Resp 11/28/19 2317 16     Temp  11/28/19 2317 98.3 F (36.8 C)     Temp Source 11/28/19 2317 Oral     SpO2 11/28/19 2317 97 %   Constitutional: Sleeping but awakens to voice and gentle nudge. No acute distress.  Eyes: Conjunctivae are normal. PERRL.  Head: Atraumatic. Nose: No congestion/rhinnorhea. Mouth/Throat: Mucous membranes are moist.  Neck: No stridor.  No cervical spine tenderness to palpation. Cardiovascular: Normal rate, regular rhythm. Good peripheral circulation. Grossly normal heart sounds.   Respiratory: Normal respiratory effort.  No retractions. Lungs CTAB. Gastrointestinal: Soft and nontender. No distention.  Musculoskeletal: No lower extremity tenderness nor edema. No gross deformities of extremities. Neurologic:  Normal speech and language. Moving extremities equally.  Skin:  Skin is warm, dry and intact. No rash noted.  ____________________________________________   LABS (all labs ordered are listed, but only abnormal results are displayed)  Labs Reviewed  COMPREHENSIVE METABOLIC PANEL - Abnormal; Notable for the following components:      Result Value   Total Protein 8.6 (*)    All other components within normal limits  ETHANOL - Abnormal; Notable for the following components:   Alcohol, Ethyl (B) 326 (*)    All other components within normal limits  CBC - Abnormal; Notable for the following components:   RBC 4.10 (*)    Hemoglobin 9.4 (*)    HCT 30.5 (*)    MCV 74.4 (*)    [facility]  22.9 (*)    RDW 20.6 (*)    All other components within normal limits   ____________________________________________  RADIOLOGY  CT head reviewed.  ____________________________________________   PROCEDURES  Procedure(s) performed:   Procedures  None  ____________________________________________   INITIAL IMPRESSION / ASSESSMENT AND PLAN / ED COURSE  Pertinent labs & imaging results that were available during my care of the patient were reviewed by me and considered in my medical decision making  (see chart for details).   Patient presents to the emergency department intoxicated.  He reports seizure but this was not witnessed.  Patient with alcohol level significantly elevated here not consistent with withdrawal related seizures.  Plan for sobering and reassess.  He did insinuate that he was drinking himself to death but denies active SI to me.  Will reassess when sober.  Plan for CT imaging of the head given intoxicated status and question of seizure today. Patient does not take AEDs.   Labs and imaging reviewed. PEER support consulted. Patient ready for discharge after that consult.  ____________________________________________  FINAL CLINICAL IMPRESSION(S) / ED DIAGNOSES  Final diagnoses:  Alcoholic intoxication without complication ([place])    Note:  This document was prepared using Dragon voice recognition software and may include unintentional dictation errors.  Nanda Quinton, MD, [facility] Emergency Medicine    Shamar Kracke, Wonda Olds, MD 12/02/19 [phone]

## 2019-11-29 NOTE — Discharge Instructions (Signed)
Substance Abuse Treatment Programs ° °Intensive Outpatient Programs °[facility]     °[address]      °[place], [place]                   °[phone]      ° °[facility] °[address] °[place] °[phone] ° ° Behavioral Health Outpatient     °(Inpatient and outpatient)     °[address]           °[phone]   ° °[facility] °[phone] (Suboxone and Methadone) ° °[address]      °[place], [place] [address]      °[phone]      ° °[address] °[place], [place] °[phone] ° °[facility] (Outpatient/Inpatient, Chemical)    °(insurance only) [phone]      °       °[facility] (Groups & Residential) °[place], [place] °[phone] ° °   °[facility]     °[address]     °[place], [place]      °[phone]      ° °[facility] (for caregivers and family) °[address] °[place], [place] °336-299-4655 ° ° ° ° ° °Residential Treatment Programs °[facility]      °[address], [place], [place] [address]  °[phone]      ° °[facility] °[address] [place], [place] [address] °[phone] ° °[facility]        °[phone]      ° °[facility] °[phone] ° °[facility]             °[address]                                         °[place], [place]                                                °[phone] or [phone]                              ° °[facility] [place] °[address] °[place], [address] °[phone] ° °[facility]    °[address]      °[place], [place]     °[phone]      ° °[facility] °[address] °[place], [place] °[phone] ° °[facility]   °[address]     °[place], [place] [address]     °[phone]      °Admissions: 8am-3pm M-F ° °[facility] °[address] °[place],  [place] °[phone] ° °[facility]: Residential Program (90 Days)   °[place], [place]      °[phone] or [phone]    ° °[facility] °[place], [place] °(Walk in Hours over the weekend or by referral) ° °[facility] °[address], [place], [place] [address] °[phone] ° °Crisis Mobile: [facility]:  1-877-626-1772 (for crisis response 24 hours a day) °[facility] Hotline:      1-800-256-2452 °Outpatient Psychiatry and Counseling ° °[facility]: Mobile Crisis   Management 24 hours:  1-877-626-1772 ° °[facility] sliding scale fee and walk in schedule: M-F 8am-12pm/1pm-3pm °[address]  °[place], [place] [address] °[phone] ° °[facility] °[address] °[place], [place] [address] °[phone] ° °[facility] ([facility])- new patient walk-in appointments available Monday - Friday 8am -3pm.          °[address] °[place], [place] [address] °336-676-6840 or crisis line- 336-676-6905 ° °[facility]/ Intensive Outpatient Therapy Program °[address] °[place], [place] [address] °[phone] ° °[facility]                  °Crisis Services      °[phone]      °[address]     °[place], [place] [address]                ° °[facility]   °[facility] °[phone] °[address] °[place], [place] [address] ° ° °[facility]          °[address]  °[place], [place] [address]       °[phone] ° °[facility] °[address] °[place], [place] [address] °336-292-1510 ° °[facility]    °[address]    °[place], [place] [address]     °[phone]      ° °Barry McKinney, Barry Moore     °[address]     °[place] [address]     °[phone]     °  °[facility] °[address] °[place] [address] ° °[facility]     °[address]     °[place], [place]      °[phone]      ° °[facility] °Barry Ahluwalia, Barry Moore °[address] °[place], [place] [address] °[phone] ° °[facility]     °[address]     °[place], [place] [address]     °336-274-1237      ° °[facility] °[address] °[place], [place] [address] °336-854-4450 °Resources for Temporary Residential Assistance/Crisis Centers ° °DAY CENTERS °[facility] °M-F 8am-3pm   °[address] [place], [place] [address]   336-332-0824 °Services include: laundry, barbering, support groups, case management, phone  & computer access, showers, AA/NA mtgs, mental health/substance abuse nurse, job skills class, disability information, VA assistance, spiritual classes, etc.  ° °HOMELESS SHELTERS ° °[facility]     °[facility]   °[address], [place]     °[phone]       °       °[facility] (women and children)       °[address] °[place], [place] [address] °336-275-0820 °[email or web address] for application and process °Application Required ° °[facility]   °[address]    °[place] [address]     °[phone]       °             °[facility] °[address] °, [place] [address] °[phone] °336-235-0363(schedule application appt.) °Application Required ° °[facility] (women only)    °[address]     °[place],  [address]     °336-884-1039      °  Intake starts 6pm daily °Need valid ID, SSC, & Police report °[facility] [place] °[address] °[place], [place] °336-881-5420 °Application Required ° °[facility] (men only)     °[address]      °[place], [place]     °[phone]      ° °[facility] °(Pregnant women only) °[address] °[place], [place] °336-275-0206 ° °[facility]      °[address]      °[place], [place] [address]     °[phone]      °       °[facility] [place] [facility] °[address] °[place], [place] °336-723-1848 °90 day commitment/SA/Application process ° °[facility](men only)     °[address]     °[place], [place]     °[phone]       °Check-in at 7pm     °       °[facility] °[address] °[place], [place] [address] °336-248-6684 °Men/Women/Women and Children must be there by 7 pm ° °[facility] °[place],  °336-722-8721                ° °

## 2019-12-12 ENCOUNTER — Ambulatory Visit (HOSPITAL_COMMUNITY)
Admission: EM | Admit: 2019-12-12 | Discharge: 2019-12-13 | Disposition: A | Discharge status: Home / Self Care | Payer: No Payment, Other | Attending: Nurse Practitioner | Admitting: Nurse Practitioner

## 2019-12-12 ENCOUNTER — Other Ambulatory Visit: Payer: Self-pay

## 2019-12-12 ENCOUNTER — Encounter (HOSPITAL_COMMUNITY): Payer: Self-pay | Admitting: Emergency Medicine

## 2019-12-12 DIAGNOSIS — F329 Major depressive disorder, single episode, unspecified: Secondary | ICD-10-CM | POA: Insufficient documentation

## 2019-12-12 DIAGNOSIS — R450 Nervousness: Secondary | ICD-10-CM | POA: Insufficient documentation

## 2019-12-12 DIAGNOSIS — F10129 Alcohol abuse with intoxication, unspecified: Secondary | ICD-10-CM

## 2019-12-12 DIAGNOSIS — G4700 Insomnia, unspecified: Secondary | ICD-10-CM | POA: Insufficient documentation

## 2019-12-12 DIAGNOSIS — R45851 Suicidal ideations: Secondary | ICD-10-CM | POA: Insufficient documentation

## 2019-12-12 DIAGNOSIS — Z7901 Long term (current) use of anticoagulants: Secondary | ICD-10-CM | POA: Insufficient documentation

## 2019-12-12 DIAGNOSIS — R251 Tremor, unspecified: Secondary | ICD-10-CM | POA: Insufficient documentation

## 2019-12-12 DIAGNOSIS — Z79899 Other long term (current) drug therapy: Secondary | ICD-10-CM | POA: Insufficient documentation

## 2019-12-12 DIAGNOSIS — R45850 Homicidal ideations: Secondary | ICD-10-CM | POA: Insufficient documentation

## 2019-12-12 DIAGNOSIS — F17210 Nicotine dependence, cigarettes, uncomplicated: Secondary | ICD-10-CM | POA: Insufficient documentation

## 2019-12-12 DIAGNOSIS — F419 Anxiety disorder, unspecified: Secondary | ICD-10-CM | POA: Insufficient documentation

## 2019-12-12 DIAGNOSIS — F1094 Alcohol use, unspecified with alcohol-induced mood disorder: Secondary | ICD-10-CM | POA: Insufficient documentation

## 2019-12-12 LAB — POC SARS CORONAVIRUS 2 AG -  ED: SARS Coronavirus 2 Ag: NEGATIVE

## 2019-12-12 LAB — POCT URINE DRUG SCREEN - MANUAL ENTRY (I-SCREEN)
POC Amphetamine UR: NOT DETECTED
POC Buprenorphine (BUP): NOT DETECTED
POC Cocaine UR: NOT DETECTED
POC Marijuana UR: NOT DETECTED
POC Methadone UR: NOT DETECTED
POC Methamphetamine UR: NOT DETECTED
POC Morphine: NOT DETECTED
POC Oxazepam (BZO): POSITIVE — AB
POC Oxycodone UR: NOT DETECTED
POC Secobarbital (BAR): NOT DETECTED

## 2019-12-12 MED ORDER — FOLIC ACID 1 MG PO TABS
1.0000 mg | ORAL_TABLET | Freq: Every day | ORAL | Status: DC
  Administered 2019-12-13: 1 mg via ORAL
  Filled 2019-12-12: qty 1

## 2019-12-12 MED ORDER — AMLODIPINE BESYLATE 5 MG PO TABS
5.0000 mg | ORAL_TABLET | Freq: Every day | ORAL | Status: DC
  Administered 2019-12-13: 5 mg via ORAL
  Filled 2019-12-12: qty 1

## 2019-12-12 MED ORDER — ADULT MULTIVITAMIN W/MINERALS CH
1.0000 | ORAL_TABLET | Freq: Every day | ORAL | Status: DC
  Administered 2019-12-13: 1 via ORAL
  Filled 2019-12-12: qty 1

## 2019-12-12 MED ORDER — ONDANSETRON 4 MG PO TBDP: 4.0000 mg | ORAL_TABLET | Freq: Four times a day (QID) | ORAL | Status: DC | PRN

## 2019-12-12 MED ORDER — LORAZEPAM 1 MG PO TABS: 1.0000 mg | ORAL_TABLET | Freq: Every day | ORAL | Status: DC

## 2019-12-12 MED ORDER — PANTOPRAZOLE SODIUM 40 MG PO TBEC
40.0000 mg | DELAYED_RELEASE_TABLET | Freq: Two times a day (BID) | ORAL | Status: DC
  Administered 2019-12-13 (×2): 40 mg via ORAL
  Filled 2019-12-12 (×2): qty 1

## 2019-12-12 MED ORDER — ALUM & MAG HYDROXIDE-SIMETH 200-200-20 MG/5ML PO SUSP: 30.0000 mL | ORAL | Status: DC | PRN

## 2019-12-12 MED ORDER — MAGNESIUM HYDROXIDE 400 MG/5ML PO SUSP: 30.0000 mL | Freq: Every day | ORAL | Status: DC | PRN

## 2019-12-12 MED ORDER — ACETAMINOPHEN 325 MG PO TABS: 650.0000 mg | ORAL_TABLET | Freq: Four times a day (QID) | ORAL | Status: DC | PRN

## 2019-12-12 MED ORDER — LOPERAMIDE HCL 2 MG PO CAPS: 2.0000 mg | ORAL_CAPSULE | ORAL | Status: DC | PRN

## 2019-12-12 MED ORDER — LORAZEPAM 1 MG PO TABS: 1.0000 mg | ORAL_TABLET | Freq: Three times a day (TID) | ORAL | Status: DC

## 2019-12-12 MED ORDER — HYDROXYZINE HCL 25 MG PO TABS: 25.0000 mg | ORAL_TABLET | Freq: Four times a day (QID) | ORAL | Status: DC | PRN

## 2019-12-12 MED ORDER — LORAZEPAM 1 MG PO TABS
1.0000 mg | ORAL_TABLET | Freq: Four times a day (QID) | ORAL | Status: DC | PRN
  Administered 2019-12-13: 1 mg via ORAL
  Filled 2019-12-12: qty 1

## 2019-12-12 MED ORDER — LORAZEPAM 1 MG PO TABS
1.0000 mg | ORAL_TABLET | Freq: Four times a day (QID) | ORAL | Status: DC
  Administered 2019-12-12 – 2019-12-13 (×2): 1 mg via ORAL
  Filled 2019-12-12 (×2): qty 1

## 2019-12-12 MED ORDER — LORAZEPAM 1 MG PO TABS: 1.0000 mg | ORAL_TABLET | Freq: Two times a day (BID) | ORAL | Status: DC

## 2019-12-12 MED ORDER — THIAMINE HCL 100 MG PO TABS
100.0000 mg | ORAL_TABLET | Freq: Every day | ORAL | Status: DC
  Administered 2019-12-13: 100 mg via ORAL
  Filled 2019-12-12: qty 1

## 2019-12-12 NOTE — ED Provider Notes (Signed)
Behavioral Health Admission [facility])  Date: 12/13/19 Patient Name: Barry Moore MRN: [id number] Chief Complaint:  Chief Complaint  Patient presents with  . Alcohol Problem  . Suicidal      Diagnoses:  Final diagnoses:  Alcohol-induced mood disorder ([place])  Alcohol abuse with intoxication ([place])    HPI: Barry Moore is a 54 y.o. male with a history of chronic alcohol use who presents to [facility] voluntarily with law enforcement due to alcohol intoxication and suicidal thoughts with a plan. Patient is well known to behavioral health and area emergency departments with similar presentations. Reviewed TTS assessment and validated with patient. On evaluation patient is alert and oriented x 4, pleasant, and cooperative. Speech is clear and coherent, slow pace, but not slurred. Mood is depressed and affect is congruent with mood. Thought process is coherent and thought content is logical. Denies audiovisual hallucinations. No indication that patient is responding to internal stimuli. No evidence of delusional thought content. Reports suicidal thoughts with a plan to walk into traffic. Reports homicidal towards people in general. Denies any HI intent or plan. Reports continued daily alcohol use. States that today he stopped counting after the 6th 40 ounce beer. Denies use of other substances. States that he is not currently taking any medications.   PHQ 2-9:    Office Visit from 02/01/2019 in [place]  Thoughts that you would be better off dead, or of hurting yourself in some way Several days  PHQ-9 Total Score 11        ED from 11/29/2019 in [place] DEPT ED from 11/24/2019 in [place] DEPT ED from 07/21/2019 in [place] CATEGORY Low Risk Low Risk Error: Q6 is Yes, you must answer 7       Total Time spent with patient: 30  minutes  Musculoskeletal  Strength & Muscle Tone: within normal limits Gait & Station: unsteady Patient leans: N/A  Psychiatric Specialty Exam  Presentation General Appearance: Fairly Groomed  Eye Contact:Fair  Speech:Clear and Coherent;Slow  Speech Volume:Decreased  Handedness:Right   Mood and Affect  Mood:Dysphoric  Affect:Appropriate   Thought Process  Thought Processes:Coherent;Linear  Descriptions of Associations:Intact  Orientation:Full (Time, Place and Person)  Thought Content:WDL  Hallucinations:Hallucinations: None  Ideas of Reference:None  Suicidal Thoughts:Suicidal Thoughts: Yes, Active SI Active Intent and/or Plan: With Intent;With Plan;With Means to Carry Out  Homicidal Thoughts:Homicidal Thoughts: Yes, Passive HI Passive Intent and/or Plan: Without Intent;Without Plan   Sensorium  Memory:Immediate Good;Recent Good;Remote Fair  Judgment:Impaired  Insight:Lacking   Executive Functions  Concentration:Fair  Attention Span:Fair  [place] of Knowledge:Good  Language:Good   Psychomotor Activity  Psychomotor Activity:Psychomotor Activity: Tremor   Assets  Assets:Desire for Improvement;Communication Skills   Sleep  Sleep:Sleep: Fair   Physical Exam Constitutional:      General: He is not in acute distress.    Appearance: He is not ill-appearing, toxic-appearing or diaphoretic.  HENT:     Head: Normocephalic.     Right Ear: External ear normal.     Left Ear: External ear normal.  Eyes:     Conjunctiva/sclera: Conjunctivae normal.     Pupils: Pupils are equal, round, and reactive to light.  Cardiovascular:     Rate and Rhythm: Normal rate.  Pulmonary:     Effort: Pulmonary effort is normal. No respiratory distress.  Musculoskeletal:        General: Normal range of motion.  Skin:    General: Skin is warm and dry.  Neurological:     Mental Status: He is alert and oriented to person, place, and time.   Psychiatric:        Mood and Affect: Mood is depressed.        Behavior: Behavior is cooperative.        Thought Content: Thought content is not paranoid or delusional. Thought content includes homicidal and suicidal ideation. Thought content includes suicidal plan. Thought content does not include homicidal plan.    Review of Systems  Constitutional: Negative for chills, diaphoresis, fever, malaise/fatigue and weight loss.  HENT: Negative for congestion.   Respiratory: Negative for cough and shortness of breath.   Cardiovascular: Negative for chest pain and palpitations.  Gastrointestinal: Negative for diarrhea, nausea and vomiting.  Neurological: Negative for dizziness.  Psychiatric/Behavioral: Positive for depression, substance abuse and suicidal ideas. Negative for hallucinations and memory loss. The patient is nervous/anxious and has insomnia.   All other systems reviewed and are negative.   Blood pressure (!) 111/99, pulse 80, temperature 98.6 F (37 C), temperature source Oral, resp. rate 18, SpO2 100 %. There is no height or weight on file to calculate BMI.  Past Psychiatric History: Alcohol Use Disorder  Is the patient at risk to self? Yes  Has the patient been a risk to self in the past 6 months? Yes .    Has the patient been a risk to self within the distant past? Yes   Is the patient a risk to others? No   Has the patient been a risk to others in the past 6 months? No   Has the patient been a risk to others within the distant past? No   Past Medical History:  Past Medical History:  Diagnosis Date  . Alcoholic hepatitis   . Depression   . ETOH abuse   . Gastric bleed 08/2018  . Hypertension   . Pancreatitis   . Seizures ([place])   . Suicidal behavior     Past Surgical History:  Procedure Laterality Date  . BIOPSY  08/05/2018   Procedure: BIOPSY;  Surgeon: Rush Landmark Telford Nab., MD;  Location: [facility];  Service: Gastroenterology;;  . BIOPSY  08/07/2018    Procedure: BIOPSY;  Surgeon: Thornton Park, MD;  Location: [facility] ENDOSCOPY;  Service: Gastroenterology;;  . BIOPSY  01/02/2019   Procedure: BIOPSY;  Surgeon: Gatha Mayer, MD;  Location: [facility] ENDOSCOPY;  Service: Endoscopy;;  . COLONOSCOPY WITH PROPOFOL N/A 08/07/2018   Procedure: COLONOSCOPY WITH PROPOFOL;  Surgeon: Thornton Park, MD;  Location: [facility];  Service: Gastroenterology;  Laterality: N/A;  . ESOPHAGOGASTRODUODENOSCOPY N/A 01/02/2019   Procedure: ESOPHAGOGASTRODUODENOSCOPY (EGD);  Surgeon: Gatha Mayer, MD;  Location: [facility];  Service: Endoscopy;  Laterality: N/A;  . ESOPHAGOGASTRODUODENOSCOPY N/A 07/03/2019   Procedure: ESOPHAGOGASTRODUODENOSCOPY (EGD);  Surgeon: Wonda Horner, MD;  Location: [facility];  Service: Endoscopy;  Laterality: N/A;  . ESOPHAGOGASTRODUODENOSCOPY (EGD) WITH PROPOFOL N/A 11/02/2016   Procedure: ESOPHAGOGASTRODUODENOSCOPY (EGD) WITH PROPOFOL;  Surgeon: Carol Ada, MD;  Location: [facility] ENDOSCOPY;  Service: Endoscopy;  Laterality: N/A;  . ESOPHAGOGASTRODUODENOSCOPY (EGD) WITH PROPOFOL N/A 08/05/2018   Procedure: ESOPHAGOGASTRODUODENOSCOPY (EGD) WITH PROPOFOL;  Surgeon: Rush Landmark Telford Nab., MD;  Location: [facility];  Service: Gastroenterology;  Laterality: N/A;  . ESOPHAGOGASTRODUODENOSCOPY (EGD) WITH PROPOFOL N/A 07/14/2019   Procedure: ESOPHAGOGASTRODUODENOSCOPY (EGD) WITH PROPOFOL;  Surgeon: Otis Brace, MD;  Location: [facility] ENDOSCOPY;  Service: Gastroenterology;  Laterality: N/A;  . HERNIA REPAIR    .  LEG SURGERY    . POLYPECTOMY  08/07/2018   Procedure: POLYPECTOMY;  Surgeon: Thornton Park, MD;  Location: [facility] ENDOSCOPY;  Service: Gastroenterology;;    Family History:  Family History  Problem Relation Age of Onset  . Diabetes Mother   . Alcoholism Mother   . Emphysema Father   . Lung cancer Father   . Alcoholism Father     Social History:  Social History   Socioeconomic History  . Marital status: Divorced    Spouse  name: Not on file  . Number of children: Not on file  . Years of education: Not on file  . Highest education level: Not on file  Occupational History  . Not on file  Tobacco Use  . Smoking status: Current Every Day Smoker    Packs/day: 1.00    Types: Cigarettes  . Smokeless tobacco: Never Used  Vaping Use  . Vaping Use: Never used  Substance and Sexual Activity  . Alcohol use: Yes    Comment: 5+ BEERS DAILY, Patient does not know how much he drank tonight  . Drug use: Not Currently    Frequency: 3.0 times per week    Types: Cocaine  . Sexual activity: Not Currently  Other Topics Concern  . Not on file  Social History Narrative  . Not on file   Social Determinants of Health   Financial Resource Strain:   . Difficulty of Paying Living Expenses: Not on file  Food Insecurity:   . Worried About Charity fundraiser in the Last Year: Not on file  . Ran Out of Food in the Last Year: Not on file  Transportation Needs:   . Lack of Transportation (Medical): Not on file  . Lack of Transportation (Non-Medical): Not on file  Physical Activity:   . Days of Exercise per Week: Not on file  . Minutes of Exercise per Session: Not on file  Stress:   . Feeling of Stress : Not on file  Social Connections:   . Frequency of Communication with Friends and Family: Not on file  . Frequency of Social Gatherings with Friends and Family: Not on file  . Attends Religious Services: Not on file  . Active Member of Clubs or Organizations: Not on file  . Attends Archivist Meetings: Not on file  . Marital Status: Not on file  Intimate Partner Violence:   . Fear of Current or Ex-Partner: Not on file  . Emotionally Abused: Not on file  . Physically Abused: Not on file  . Sexually Abused: Not on file    SDOH:  SDOH Screenings   Alcohol Screen:   . Last Alcohol Screening Score (AUDIT): Not on file  Depression (PHQ2-9): Medium Risk  . PHQ-2 Score: 11  Financial Resource Strain:   .  Difficulty of Paying Living Expenses: Not on file  Food Insecurity:   . Worried About Charity fundraiser in the Last Year: Not on file  . Ran Out of Food in the Last Year: Not on file  Housing:   . Last Housing Risk Score: Not on file  Physical Activity:   . Days of Exercise per Week: Not on file  . Minutes of Exercise per Session: Not on file  Social Connections:   . Frequency of Communication with Friends and Family: Not on file  . Frequency of Social Gatherings with Friends and Family: Not on file  . Attends Religious Services: Not on file  . Active Member of Clubs  or Organizations: Not on file  . Attends Archivist Meetings: Not on file  . Marital Status: Not on file  Stress:   . Feeling of Stress : Not on file  Tobacco Use: High Risk  . Smoking Tobacco Use: Current Every Day Smoker  . Smokeless Tobacco Use: Never Used  Transportation Needs:   . Film/video editor (Medical): Not on file  . Lack of Transportation (Non-Medical): Not on file    Last Labs:  Admission on 12/12/2019  Component Date Value Ref Range Status  . SARS Coronavirus 2 12/12/2019 NEGATIVE  NEGATIVE Final   Comment: (NOTE) SARS-CoV-2 target nucleic acids are NOT DETECTED.  The SARS-CoV-2 RNA is generally detectable in upper and lower respiratory specimens during the acute phase of infection. The lowest concentration of SARS-CoV-2 viral copies this assay can detect is 250 copies / mL. A negative result does not preclude SARS-CoV-2 infection and should not be used as the sole basis for treatment or other patient management decisions.  A negative result may occur with improper specimen collection / handling, submission of specimen other than nasopharyngeal swab, presence of viral mutation(s) within the areas targeted by this assay, and inadequate number of viral copies (<250 copies / mL). A negative result must be combined with clinical observations, patient history, and epidemiological  information.  Fact Sheet for Patients:   StrictlyIdeas.no  Fact Sheet for Healthcare Providers: BankingDealers.co.za  This test is not yet approved or                           cleared by the [place] FDA and has been authorized for detection and/or diagnosis of SARS-CoV-2 by FDA under an Emergency Use Authorization (EUA).  This EUA will remain in effect (meaning this test can be used) for the duration of the COVID-19 declaration under Section 564(b)(1) of the Act, 21 U.S.C. section 360bbb-3(b)(1), unless the authorization is terminated or revoked sooner.  Performed at [facility], [place] [address], [place], [place] [address]   . SARS Coronavirus 2 Ag 12/12/2019 Negative  Negative Preliminary  . WBC 12/12/2019 4.9  4.0 - 10.5 K/uL Final  . RBC 12/12/2019 4.42  4.22 - 5.81 MIL/uL Final  . Hemoglobin 12/12/2019 10.2* 13.0 - 17.0 g/dL Final  . HCT 12/12/2019 33.5* 39 - 52 % Final  . MCV 12/12/2019 75.8* 80.0 - 100.0 fL Final  . MCH 12/12/2019 23.1* 26.0 - 34.0 pg Final  . MCHC 12/12/2019 30.4  30.0 - 36.0 g/dL Final  . RDW 12/12/2019 21.0* 11.5 - 15.5 % Final  . Platelets 12/12/2019 76* 150 - 400 K/uL Final   Comment: REPEATED TO VERIFY Immature Platelet Fraction may be clinically indicated, consider ordering this additional test [id number] CONSISTENT WITH PREVIOUS RESULT   . nRBC 12/12/2019 0.0  0.0 - 0.2 % Final  . Neutrophils Relative % 12/12/2019 34  % Final  . Neutro Abs 12/12/2019 1.7  1.7 - 7.7 K/uL Final  . Lymphocytes Relative 12/12/2019 49  % Final  . Lymphs Abs 12/12/2019 2.4  0.7 - 4.0 K/uL Final  . Monocytes Relative 12/12/2019 13  % Final  . Monocytes Absolute 12/12/2019 0.6  0 - 1 K/uL Final  . Eosinophils Relative 12/12/2019 3  % Final  . Eosinophils Absolute 12/12/2019 0.1  0 - 0 K/uL Final  . Basophils Relative 12/12/2019 1  % Final  . Basophils Absolute 12/12/2019 0.1  0 - 0 K/uL Final  .  Immature  Granulocytes 12/12/2019 0  % Final  . Abs Immature Granulocytes 12/12/2019 0.01  0.00 - 0.07 K/uL Final   Performed at [facility] [address] [place], [place] [address]  . Sodium 12/12/2019 137  135 - 145 mmol/L Final  . Potassium 12/12/2019 3.6  3.5 - 5.1 mmol/L Final  . Chloride 12/12/2019 100  98 - 111 mmol/L Final  . CO2 12/12/2019 24  22 - 32 mmol/L Final  . Glucose, Bld 12/12/2019 92  70 - 99 mg/dL Final   Glucose reference range applies only to samples taken after fasting for at least 8 hours.  . BUN 12/12/2019 <5* 6 - 20 mg/dL Final  . Creatinine, Ser 12/12/2019 0.80  0.61 - 1.24 mg/dL Final  . Calcium 12/12/2019 9.2  8.9 - 10.3 mg/dL Final  . Total Protein 12/12/2019 8.8* 6.5 - 8.1 g/dL Final  . Albumin 12/12/2019 4.4  3.5 - 5.0 g/dL Final  . AST 12/12/2019 75* 15 - 41 U/L Final  . ALT 12/12/2019 26  0 - 44 U/L Final  . Alkaline Phosphatase 12/12/2019 120  38 - 126 U/L Final  . Total Bilirubin 12/12/2019 0.7  0.3 - 1.2 mg/dL Final  . GFR calc non Af Amer 12/12/2019 >60  >60 mL/min Final  . GFR calc Af Amer 12/12/2019 >60  >60 mL/min Final  . Anion gap 12/12/2019 13  5 - 15 Final   Performed at [facility] [address], [place], [place] [address]  . Alcohol, Ethyl (B) 12/12/2019 380* <10 mg/dL Final   Comment: CRITICAL RESULT CALLED TO, READ BACK BY AND VERIFIED WITH: LONDON L,RN 12/13/19 0023 WAYK Performed at [facility] [address], [place], [place] [address]   . POC Amphetamine UR 12/12/2019 None Detected  None Detected Final  . POC Secobarbital (BAR) 12/12/2019 None Detected  None Detected Final  . POC Buprenorphine (BUP) 12/12/2019 None Detected  None Detected Final  . POC Oxazepam (BZO) 12/12/2019 Positive* None Detected Final  . POC Cocaine UR 12/12/2019 None Detected  None Detected Final  . POC Methamphetamine UR 12/12/2019 None Detected  None Detected Final  . POC Morphine 12/12/2019 None Detected  None Detected Final  . POC  Oxycodone UR 12/12/2019 None Detected  None Detected Final  . POC Methadone UR 12/12/2019 None Detected  None Detected Final  . POC Marijuana UR 12/12/2019 None Detected  None Detected Final  Admission on 11/29/2019, Discharged on 11/29/2019  Component Date Value Ref Range Status  . Sodium 11/28/2019 141  135 - 145 mmol/L Final  . Potassium 11/28/2019 3.8  3.5 - 5.1 mmol/L Final  . Chloride 11/28/2019 102  98 - 111 mmol/L Final  . CO2 11/28/2019 27  22 - 32 mmol/L Final  . Glucose, Bld 11/28/2019 95  70 - 99 mg/dL Final   Glucose reference range applies only to samples taken after fasting for at least 8 hours.  . BUN 11/28/2019 7  6 - 20 mg/dL Final  . Creatinine, Ser 11/28/2019 0.64  0.61 - 1.24 mg/dL Final  . Calcium 11/28/2019 8.9  8.9 - 10.3 mg/dL Final  . Total Protein 11/28/2019 8.6* 6.5 - 8.1 g/dL Final  . Albumin 11/28/2019 4.3  3.5 - 5.0 g/dL Final  . AST 11/28/2019 39  15 - 41 U/L Final  . ALT 11/28/2019 21  0 - 44 U/L Final  . Alkaline Phosphatase 11/28/2019 116  38 - 126 U/L Final  . Total  Bilirubin 11/28/2019 0.6  0.3 - 1.2 mg/dL Final  . GFR calc non Af Amer 11/28/2019 >60  >60 mL/min Final  . GFR calc Af Amer 11/28/2019 >60  >60 mL/min Final  . Anion gap 11/28/2019 12  5 - 15 Final   Performed at [facility], [place] [address] [place], [place] [address]  . Alcohol, Ethyl (B) 11/28/2019 326* <10 mg/dL Final   Comment: CRITICAL RESULT CALLED TO, READ BACK BY AND VERIFIED WITH: RN J SMITH AT 0023 11/29/19 CRUICKSHANK A (NOTE) Lowest detectable limit for serum alcohol is 10 mg/dL.  For medical purposes only. Performed at [facility], [place] [address], [place], [place] [address]   . WBC 11/28/2019 5.6  4.0 - 10.5 K/uL Final  . RBC 11/28/2019 4.10* 4.22 - 5.81 MIL/uL Final  . Hemoglobin 11/28/2019 9.4* 13.0 - 17.0 g/dL Final  . HCT 11/28/2019 30.5* 39 - 52 % Final  . MCV 11/28/2019 74.4* 80.0 - 100.0 fL Final  . MCH 11/28/2019 22.9*  26.0 - 34.0 pg Final  . MCHC 11/28/2019 30.8  30.0 - 36.0 g/dL Final  . RDW 11/28/2019 20.6* 11.5 - 15.5 % Final  . Platelets 11/28/2019 180  150 - 400 K/uL Final  . nRBC 11/28/2019 0.0  0.0 - 0.2 % Final   Performed at [facility], [place] [address], [place], [place] [address]  Admission on 07/24/2019, Discharged on 07/24/2019  Component Date Value Ref Range Status  . Color, Urine 07/24/2019 STRAW* YELLOW Final  . APPearance 07/24/2019 CLEAR  CLEAR Final  . Specific Gravity, Urine 07/24/2019 1.002* 1.005 - 1.030 Final  . pH 07/24/2019 5.0  5.0 - 8.0 Final  . Glucose, UA 07/24/2019 NEGATIVE  NEGATIVE mg/dL Final  . Hgb urine dipstick 07/24/2019 NEGATIVE  NEGATIVE Final  . Bilirubin Urine 07/24/2019 NEGATIVE  NEGATIVE Final  . Ketones, ur 07/24/2019 NEGATIVE  NEGATIVE mg/dL Final  . Protein, ur 07/24/2019 NEGATIVE  NEGATIVE mg/dL Final  . Nitrite 07/24/2019 NEGATIVE  NEGATIVE Final  . Chalmers Guest 07/24/2019 NEGATIVE  NEGATIVE Final   Performed at [facility] [address], [place], [place] [address]  . Opiates 07/24/2019 NONE DETECTED  NONE DETECTED Final  . Cocaine 07/24/2019 NONE DETECTED  NONE DETECTED Final  . Benzodiazepines 07/24/2019 NONE DETECTED  NONE DETECTED Final  . Amphetamines 07/24/2019 NONE DETECTED  NONE DETECTED Final  . Tetrahydrocannabinol 07/24/2019 NONE DETECTED  NONE DETECTED Final  . Barbiturates 07/24/2019 NONE DETECTED  NONE DETECTED Final   Comment: (NOTE) DRUG SCREEN FOR MEDICAL PURPOSES ONLY.  IF CONFIRMATION IS NEEDED FOR ANY PURPOSE, NOTIFY LAB WITHIN 5 DAYS. LOWEST DETECTABLE LIMITS FOR URINE DRUG SCREEN Drug Class                     Cutoff (ng/mL) Amphetamine and metabolites    1000 Barbiturate and metabolites    200 Benzodiazepine                 569 Tricyclics and metabolites     300 Opiates and metabolites        300 Cocaine and metabolites        300 THC                            50 Performed at  [facility], [place] [address], [place],  [address]   . WBC 07/24/2019 4.2  4.0 - 10.5  K/uL Final  . RBC 07/24/2019 3.65* 4.22 - 5.81 MIL/uL Final  . Hemoglobin 07/24/2019 8.4* 13.0 - 17.0 g/dL Final   Comment: Reticulocyte Hemoglobin testing may be clinically indicated, consider ordering this additional test [id number]   . HCT 07/24/2019 28.1* 39 - 52 % Final  . MCV 07/24/2019 77.0* 80.0 - 100.0 fL Final  . MCH 07/24/2019 23.0* 26.0 - 34.0 pg Final  . MCHC 07/24/2019 29.9* 30.0 - 36.0 g/dL Final  . RDW 07/24/2019 23.9* 11.5 - 15.5 % Final  . Platelets 07/24/2019 138* 150 - 400 K/uL Final   Comment: SPECIMEN CHECKED FOR CLOTS Immature Platelet Fraction may be clinically indicated, consider ordering this additional test [id number] PLATELET COUNT CONFIRMED BY SMEAR REPEATED TO VERIFY   . nRBC 07/24/2019 0.0  0.0 - 0.2 % Final  . Neutrophils Relative % 07/24/2019 43  % Final  . Neutro Abs 07/24/2019 1.8  1.7 - 7.7 K/uL Final  . Lymphocytes Relative 07/24/2019 36  % Final  . Lymphs Abs 07/24/2019 1.5  0.7 - 4.0 K/uL Final  . Monocytes Relative 07/24/2019 15  % Final  . Monocytes Absolute 07/24/2019 0.6  0 - 1 K/uL Final  . Eosinophils Relative 07/24/2019 4  % Final  . Eosinophils Absolute 07/24/2019 0.2  0 - 0 K/uL Final  . Basophils Relative 07/24/2019 2  % Final  . Basophils Absolute 07/24/2019 0.1  0 - 0 K/uL Final  . Immature Granulocytes 07/24/2019 0  % Final  . Abs Immature Granulocytes 07/24/2019 0.00  0.00 - 0.07 K/uL Final  . Target Cells 07/24/2019 PRESENT   Final   Performed at [facility], [place] [address], [place], [place] [address]  . Sodium 07/24/2019 141  135 - 145 mmol/L Final  . Potassium 07/24/2019 3.9  3.5 - 5.1 mmol/L Final  . Chloride 07/24/2019 105  98 - 111 mmol/L Final  . CO2 07/24/2019 25  22 - 32 mmol/L Final  . Glucose, Bld 07/24/2019 93  70 - 99 mg/dL Final   Glucose reference range applies only to samples  taken after fasting for at least 8 hours.  . BUN 07/24/2019 7  6 - 20 mg/dL Final  . Creatinine, Ser 07/24/2019 0.55* 0.61 - 1.24 mg/dL Final  . Calcium 07/24/2019 8.7* 8.9 - 10.3 mg/dL Final  . Total Protein 07/24/2019 8.5* 6.5 - 8.1 g/dL Final  . Albumin 07/24/2019 3.9  3.5 - 5.0 g/dL Final  . AST 07/24/2019 82* 15 - 41 U/L Final  . ALT 07/24/2019 38  0 - 44 U/L Final  . Alkaline Phosphatase 07/24/2019 175* 38 - 126 U/L Final  . Total Bilirubin 07/24/2019 0.8  0.3 - 1.2 mg/dL Final  . GFR calc non Af Amer 07/24/2019 >60  >60 mL/min Final  . GFR calc Af Amer 07/24/2019 >60  >60 mL/min Final  . Anion gap 07/24/2019 11  5 - 15 Final   Performed at [facility], [place] [address], [place], [place] [address]  . Alcohol, Ethyl (B) 07/24/2019 355* <10 mg/dL Final   Comment: CRITICAL RESULT CALLED TO, READ BACK BY AND VERIFIED WITH: G.GARRISON AT 2426 ON 07/24/19 BY N.THOMPSON (NOTE) Lowest detectable limit for serum alcohol is 10 mg/dL. For medical purposes only. Performed at [facility], [place] [address], [place],  [address]   Admission on 07/21/2019, Discharged on 07/21/2019  Component Date Value Ref Range Status  . Sodium 07/21/2019 127* 135 - 145 mmol/L Final  . Potassium 07/21/2019  3.4* 3.5 - 5.1 mmol/L Final  . Chloride 07/21/2019 95* 98 - 111 mmol/L Final  . CO2 07/21/2019 21* 22 - 32 mmol/L Final  . Glucose, Bld 07/21/2019 116* 70 - 99 mg/dL Final   Glucose reference range applies only to samples taken after fasting for at least 8 hours.  . BUN 07/21/2019 5* 6 - 20 mg/dL Final  . Creatinine, Ser 07/21/2019 0.71  0.61 - 1.24 mg/dL Final  . Calcium 07/21/2019 9.4  8.9 - 10.3 mg/dL Final  . Total Protein 07/21/2019 8.1  6.5 - 8.1 g/dL Final  . Albumin 07/21/2019 3.5  3.5 - 5.0 g/dL Final  . AST 07/21/2019 108* 15 - 41 U/L Final  . ALT 07/21/2019 35  0 - 44 U/L Final  . Alkaline Phosphatase 07/21/2019 187* 38 - 126 U/L Final  . Total  Bilirubin 07/21/2019 0.8  0.3 - 1.2 mg/dL Final  . GFR calc non Af Amer 07/21/2019 >60  >60 mL/min Final  . GFR calc Af Amer 07/21/2019 >60  >60 mL/min Final  . Anion gap 07/21/2019 11  5 - 15 Final   Performed at [facility] [address] [place], [place] [address]  . Alcohol, Ethyl (B) 07/21/2019 320* <10 mg/dL Final   Comment: CRITICAL RESULT CALLED TO, READ BACK BY AND VERIFIED WITH: RN R SANGLANG @0132  07/21/19 BY S GEZAHEGN (NOTE) Lowest detectable limit for serum alcohol is 10 mg/dL. For medical purposes only. Performed at [facility], [place] [address], [place], [place] [address]   . Salicylate Lvl 28/31/5176 <7.0* 7.0 - 30.0 mg/dL Final   Performed at [facility] [address], [place], [place] [address]  . Acetaminophen (Tylenol), Serum 07/21/2019 <10* 10 - 30 ug/mL Final   Comment: (NOTE) Therapeutic concentrations vary significantly. A range of 10-30 ug/mL  may be an effective concentration for many patients. However, some  are best treated at concentrations outside of this range. Acetaminophen concentrations >150 ug/mL at 4 hours after ingestion  and >50 ug/mL at 12 hours after ingestion are often associated with  toxic reactions. Performed at [facility] Lab, [place] [address] [place], [place] [address]   . WBC 07/21/2019 7.1  4.0 - 10.5 K/uL Final  . RBC 07/21/2019 3.75* 4.22 - 5.81 MIL/uL Final  . Hemoglobin 07/21/2019 8.6* 13.0 - 17.0 g/dL Final   Comment: Reticulocyte Hemoglobin testing may be clinically indicated, consider ordering this additional test [id number]   . HCT 07/21/2019 29.0* 39 - 52 % Final  . MCV 07/21/2019 77.3* 80.0 - 100.0 fL Final  . MCH 07/21/2019 22.9* 26.0 - 34.0 pg Final  . MCHC 07/21/2019 29.7* 30.0 - 36.0 g/dL Final  . RDW 07/21/2019 23.8* 11.5 - 15.5 % Final  . Platelets 07/21/2019 105* 150 - 400 K/uL Final   Comment: Immature Platelet Fraction may be clinically indicated, consider ordering this additional  test [id number] REPEATED TO VERIFY   . nRBC 07/21/2019 0.0  0.0 - 0.2 % Final   Performed at [facility], [place] [address] [place], [place] [address]  . Opiates 07/21/2019 NONE DETECTED  NONE DETECTED Final  . Cocaine 07/21/2019 NONE DETECTED  NONE DETECTED Final  . Benzodiazepines 07/21/2019 NONE DETECTED  NONE DETECTED Final  . Amphetamines 07/21/2019 NONE DETECTED  NONE DETECTED Final  . Tetrahydrocannabinol 07/21/2019 NONE DETECTED  NONE DETECTED Final  . Barbiturates 07/21/2019 NONE DETECTED  NONE DETECTED Final   Comment: (NOTE) DRUG SCREEN FOR MEDICAL PURPOSES  ONLY.  IF CONFIRMATION IS NEEDED FOR ANY PURPOSE, NOTIFY LAB WITHIN 5 DAYS. LOWEST DETECTABLE LIMITS FOR URINE DRUG SCREEN Drug Class                     Cutoff (ng/mL) Amphetamine and metabolites    1000 Barbiturate and metabolites    200 Benzodiazepine                 852 Tricyclics and metabolites     300 Opiates and metabolites        300 Cocaine and metabolites        300 THC                            50 Performed at [facility], [place] [address], [place], [place] [address]   Admission on 07/18/2019, Discharged on 07/20/2019  Component Date Value Ref Range Status  . WBC 07/19/2019 4.0  4.0 - 10.5 K/uL Final  . RBC 07/19/2019 3.14* 4.22 - 5.81 MIL/uL Final  . Hemoglobin 07/19/2019 7.3* 13.0 - 17.0 g/dL Final   Comment: Reticulocyte Hemoglobin testing may be clinically indicated, consider ordering this additional test [id number]   . HCT 07/19/2019 24.3* 39 - 52 % Final  . MCV 07/19/2019 77.4* 80.0 - 100.0 fL Final  . MCH 07/19/2019 23.2* 26.0 - 34.0 pg Final  . MCHC 07/19/2019 30.0  30.0 - 36.0 g/dL Final  . RDW 07/19/2019 24.5* 11.5 - 15.5 % Final  . Platelets 07/19/2019 105* 150 - 400 K/uL Final   Comment: CONSISTENT WITH PREVIOUS RESULT Immature Platelet Fraction may be clinically indicated, consider ordering this additional test [id number] PLATELET COUNT CONFIRMED BY SMEAR   . nRBC  07/19/2019 0.0  0.0 - 0.2 % Final  . Neutrophils Relative % 07/19/2019 31  % Final  . Neutro Abs 07/19/2019 1.2* 1.7 - 7.7 K/uL Final  . Lymphocytes Relative 07/19/2019 44  % Final  . Lymphs Abs 07/19/2019 1.8  0.7 - 4.0 K/uL Final  . Monocytes Relative 07/19/2019 19  % Final  . Monocytes Absolute 07/19/2019 0.8  0 - 1 K/uL Final  . Eosinophils Relative 07/19/2019 4  % Final  . Eosinophils Absolute 07/19/2019 0.1  0 - 0 K/uL Final  . Basophils Relative 07/19/2019 2  % Final  . Basophils Absolute 07/19/2019 0.1  0 - 0 K/uL Final  . Immature Granulocytes 07/19/2019 0  % Final  . Abs Immature Granulocytes 07/19/2019 0.01  0.00 - 0.07 K/uL Final  . Polychromasia 07/19/2019 PRESENT   Final  . Target Cells 07/19/2019 PRESENT   Final   Performed at [facility], [place] [address], [place], [place] [address]  . Sodium 07/19/2019 137  135 - 145 mmol/L Final  . Potassium 07/19/2019 3.7  3.5 - 5.1 mmol/L Final  . Chloride 07/19/2019 104  98 - 111 mmol/L Final  . CO2 07/19/2019 25  22 - 32 mmol/L Final  . Glucose, Bld 07/19/2019 100* 70 - 99 mg/dL Final   Glucose reference range applies only to samples taken after fasting for at least 8 hours.  . BUN 07/19/2019 7  6 - 20 mg/dL Final  . Creatinine, Ser 07/19/2019 0.65  0.61 - 1.24 mg/dL Final  . Calcium 07/19/2019 8.3* 8.9 - 10.3 mg/dL Final  . Total Protein 07/19/2019 8.0  6.5 - 8.1 g/dL Final  . Albumin 07/19/2019 3.4* 3.5 - 5.0 g/dL Final  . AST  07/19/2019 105* 15 - 41 U/L Final  . ALT 07/19/2019 29  0 - 44 U/L Final  . Alkaline Phosphatase 07/19/2019 205* 38 - 126 U/L Final  . Total Bilirubin 07/19/2019 0.6  0.3 - 1.2 mg/dL Final  . GFR calc non Af Amer 07/19/2019 >60  >60 mL/min Final  . GFR calc Af Amer 07/19/2019 >60  >60 mL/min Final  . Anion gap 07/19/2019 8  5 - 15 Final   Performed at [facility], [place] [address], [place], [place] [address]  . Alcohol, Ethyl (B) 07/19/2019 323* <10 mg/dL Final    Comment: CRITICAL RESULT CALLED TO, READ BACK BY AND VERIFIED WITH: RN J NASH AT 0043 07/19/19 CRUICKSHANK A  (NOTE) Lowest detectable limit for serum alcohol is 10 mg/dL. For medical purposes only. Performed at [facility], [place] [address], [place], [place] [address]   . Salicylate Lvl 62/70/3500 <7.0* 7.0 - 30.0 mg/dL Final   Performed at [place] [address] [place], [place] [address]  . Acetaminophen (Tylenol), Serum 07/19/2019 <10* 10 - 30 ug/mL Final   Performed at [facility], [place] [address], [place], [place] [address]  . Lipase 07/19/2019 73* 11 - 51 U/L Final   Performed at [facility], [place] [address] [place], [place] [address]  . Fecal Occult Bld 07/19/2019 POSITIVE* NEGATIVE Final  . Prothrombin Time 07/19/2019 13.5  11.4 - 15.2 seconds Final  . INR 07/19/2019 1.1  0.8 - 1.2 Final   Comment: (NOTE) INR goal varies based on device and disease states. Performed at [facility], [place] [address], [place], [place] [address]   . ABO/RH(D) 07/19/2019 O POS   Final  . Antibody Screen 07/19/2019 NEG   Final  . Sample Expiration 07/19/2019    Final                   Value:07/22/2019,2359 Performed at [facility], [place] [address], [place], [place] [address]   . WBC 07/19/2019 3.2* 4.0 - 10.5 K/uL Final  . RBC 07/19/2019 3.13* 4.22 - 5.81 MIL/uL Final  . Hemoglobin 07/19/2019 7.3* 13.0 - 17.0 g/dL Final   Comment: Reticulocyte Hemoglobin testing may be clinically indicated, consider ordering this additional test [id number]   . HCT 07/19/2019 24.2* 39 - 52 % Final  . MCV 07/19/2019 77.3* 80.0 - 100.0 fL Final  . MCH 07/19/2019 23.3* 26.0 - 34.0 pg Final  . MCHC 07/19/2019 30.2  30.0 - 36.0 g/dL Final  . RDW 07/19/2019 24.4* 11.5 - 15.5 % Final  . Platelets 07/19/2019 92* 150 - 400 K/uL Final   Comment: Immature Platelet Fraction may be clinically  indicated, consider ordering this additional test [id number]   . nRBC 07/19/2019 0.0  0.0 - 0.2 % Final   Performed at [place] [address], [place],  [address]  . WBC 07/19/2019 3.3* 4.0 - 10.5 K/uL Final  . RBC 07/19/2019 3.37* 4.22 - 5.81 MIL/uL Final  . Hemoglobin 07/19/2019 7.8* 13.0 - 17.0 g/dL Final   Comment: Reticulocyte Hemoglobin testing may be clinically indicated, consider ordering this additional test [id number]   . HCT 07/19/2019 25.9* 39 - 52 % Final  . MCV 07/19/2019 76.9* 80.0 - 100.0 fL Final  . MCH 07/19/2019 23.1* 26.0 - 34.0 pg Final  . MCHC 07/19/2019 30.1  30.0 - 36.0 g/dL Final  . RDW 07/19/2019 24.3* 11.5 - 15.5 % Final  .  Platelets 07/19/2019 90* 150 - 400 K/uL Final   Comment: SPECIMEN CHECKED FOR CLOTS CONSISTENT WITH PREVIOUS RESULT Immature Platelet Fraction may be clinically indicated, consider ordering this additional test [id number] PLATELET COUNT CONFIRMED BY SMEAR   . nRBC 07/19/2019 0.0  0.0 - 0.2 % Final   Performed at [place] [address] [place], [place] [address]  . SARS Coronavirus 2 by RT PCR 07/19/2019 NEGATIVE  NEGATIVE Final   Comment: (NOTE) SARS-CoV-2 target nucleic acids are NOT DETECTED. The SARS-CoV-2 RNA is generally detectable in upper respiratoy specimens during the acute phase of infection. The lowest concentration of SARS-CoV-2 viral copies this assay can detect is 131 copies/mL. A negative result does not preclude SARS-Cov-2 infection and should not be used as the sole basis for treatment or other patient management decisions. A negative result may occur with  improper specimen collection/handling, submission of specimen other than nasopharyngeal swab, presence of viral mutation(s) within the areas targeted by this assay, and inadequate number of viral copies (<131 copies/mL). A negative result must be combined with clinical observations, patient history, and  epidemiological information. The expected result is Negative. Fact Sheet for Patients:  PinkCheek.be Fact Sheet for Healthcare Providers:  GravelBags.it This test is not yet ap                          proved or cleared by the [place] FDA and  has been authorized for detection and/or diagnosis of SARS-CoV-2 by FDA under an Emergency Use Authorization (EUA). This EUA will remain  in effect (meaning this test can be used) for the duration of the COVID-19 declaration under Section 564(b)(1) of the Act, 21 U.S.C. section 360bbb-3(b)(1), unless the authorization is terminated or revoked sooner.   . Influenza A by PCR 07/19/2019 NEGATIVE  NEGATIVE Final  . Influenza B by PCR 07/19/2019 NEGATIVE  NEGATIVE Final   Comment: (NOTE) The Xpert Xpress SARS-CoV-2/FLU/RSV assay is intended as an aid in  the diagnosis of influenza from Nasopharyngeal swab specimens and  should not be used as a sole basis for treatment. Nasal washings and  aspirates are unacceptable for Xpert Xpress SARS-CoV-2/FLU/RSV  testing. Fact Sheet for Patients: PinkCheek.be Fact Sheet for Healthcare Providers: GravelBags.it This test is not yet approved or cleared by the [place] FDA and  has been authorized for detection and/or diagnosis of SARS-CoV-2 by  FDA under an Emergency Use Authorization (EUA). This EUA will remain  in effect (meaning this test can be used) for the duration of the  Covid-19 declaration under Section 564(b)(1) of the Act, 21  U.S.C. section 360bbb-3(b)(1), unless the authorization is  terminated or revoked. Performed at [facility], [place] [address] [place], [place] [address]   . Sodium 07/19/2019 138  135 - 145 mmol/L Final  . Potassium 07/19/2019 3.6  3.5 - 5.1 mmol/L Final  . Chloride 07/19/2019 104  98 - 111 mmol/L Final  . CO2 07/19/2019 24  22 -  32 mmol/L Final  . Glucose, Bld 07/19/2019 89  70 - 99 mg/dL Final   Glucose reference range applies only to samples taken after fasting for at least 8 hours.  . BUN 07/19/2019 8  6 - 20 mg/dL Final  . Creatinine, Ser 07/19/2019 0.65  0.61 - 1.24 mg/dL Final  . Calcium 07/19/2019 8.2* 8.9 - 10.3 mg/dL Final  . Total Protein 07/19/2019 7.7  6.5 - 8.1 g/dL Final  . Albumin 07/19/2019  3.4* 3.5 - 5.0 g/dL Final  . AST 07/19/2019 88* 15 - 41 U/L Final  . ALT 07/19/2019 29  0 - 44 U/L Final  . Alkaline Phosphatase 07/19/2019 176* 38 - 126 U/L Final  . Total Bilirubin 07/19/2019 0.9  0.3 - 1.2 mg/dL Final  . GFR calc non Af Amer 07/19/2019 >60  >60 mL/min Final  . GFR calc Af Amer 07/19/2019 >60  >60 mL/min Final  . Anion gap 07/19/2019 10  5 - 15 Final   Performed at [facility], [place] [address], [place], [place] [address]  . Magnesium 07/19/2019 2.0  1.7 - 2.4 mg/dL Final   Performed at [place] [address], [place], [place] [address]  . Phosphorus 07/19/2019 3.8  2.5 - 4.6 mg/dL Final   Performed at [place] [address] [place], [place] [address]  . WBC 07/20/2019 3.7* 4.0 - 10.5 K/uL Final  . RBC 07/20/2019 3.40* 4.22 - 5.81 MIL/uL Final  . Hemoglobin 07/20/2019 7.9* 13.0 - 17.0 g/dL Final   Comment: Reticulocyte Hemoglobin testing may be clinically indicated, consider ordering this additional test [id number]   . HCT 07/20/2019 26.3* 39 - 52 % Final  . MCV 07/20/2019 77.4* 80.0 - 100.0 fL Final  . MCH 07/20/2019 23.2* 26.0 - 34.0 pg Final  . MCHC 07/20/2019 30.0  30.0 - 36.0 g/dL Final  . RDW 07/20/2019 24.5* 11.5 - 15.5 % Final  . Platelets 07/20/2019 87* 150 - 400 K/uL Final   Comment: CONSISTENT WITH PREVIOUS RESULT Immature Platelet Fraction may be clinically indicated, consider ordering this additional test [id number] REPEATED TO VERIFY   . nRBC 07/20/2019 0.0  0.0 - 0.2 % Final   Performed at [place] [address], [place], [place] [address]  . Magnesium 07/20/2019 1.9  1.7 - 2.4 mg/dL Final   Performed at [place] [address] [place], [place] [address]  . Sodium 07/20/2019 132* 135 - 145 mmol/L Final  . Potassium 07/20/2019 3.6  3.5 - 5.1 mmol/L Final  . Chloride 07/20/2019 98  98 - 111 mmol/L Final  . CO2 07/20/2019 25  22 - 32 mmol/L Final  . Glucose, Bld 07/20/2019 100* 70 - 99 mg/dL Final   Glucose reference range applies only to samples taken after fasting for at least 8 hours.  . BUN 07/20/2019 7  6 - 20 mg/dL Final  . Creatinine, Ser 07/20/2019 0.60* 0.61 - 1.24 mg/dL Final  . Calcium 07/20/2019 8.7* 8.9 - 10.3 mg/dL Final  . Total Protein 07/20/2019 7.5  6.5 - 8.1 g/dL Final  . Albumin 07/20/2019 3.4* 3.5 - 5.0 g/dL Final  . AST 07/20/2019 92* 15 - 41 U/L Final  . ALT 07/20/2019 29  0 - 44 U/L Final  . Alkaline Phosphatase 07/20/2019 165* 38 - 126 U/L Final  . Total Bilirubin 07/20/2019 2.0* 0.3 - 1.2 mg/dL Final  . GFR calc non Af Amer 07/20/2019 >60  >60 mL/min Final  . GFR calc Af Amer 07/20/2019 >60  >60 mL/min Final  . Anion gap 07/20/2019 9  5 - 15 Final   Performed at [facility], [place] [address] [place], [place] [address]  . WBC 07/20/2019 3.7* 4.0 - 10.5 K/uL Final  . RBC 07/20/2019 3.59* 4.22 - 5.81 MIL/uL Final  . Hemoglobin 07/20/2019 8.4* 13.0 - 17.0 g/dL Final   Comment: Reticulocyte Hemoglobin testing may be clinically indicated, consider ordering this  additional test [id number]   . HCT 07/20/2019 27.6* 39 - 52 % Final  . MCV 07/20/2019 76.9* 80.0 - 100.0 fL Final  . MCH 07/20/2019 23.4* 26.0 - 34.0 pg Final  . MCHC 07/20/2019 30.4  30.0 - 36.0 g/dL Final  . RDW 07/20/2019 24.0* 11.5 - 15.5 % Final  . Platelets 07/20/2019 95* 150 - 400 K/uL Final   Comment: CONSISTENT WITH PREVIOUS RESULT Immature Platelet Fraction may be clinically indicated, consider ordering this additional  test [id number]   . nRBC 07/20/2019 0.0  0.0 - 0.2 % Final   Performed at [place] [address] [place], [place] [address]  Admission on 07/13/2019, Discharged on 07/15/2019  Component Date Value Ref Range Status  . Sodium 07/13/2019 140  135 - 145 mmol/L Final  . Potassium 07/13/2019 3.8  3.5 - 5.1 mmol/L Final  . Chloride 07/13/2019 103  98 - 111 mmol/L Final  . CO2 07/13/2019 27  22 - 32 mmol/L Final  . Glucose, Bld 07/13/2019 103* 70 - 99 mg/dL Final   Glucose reference range applies only to samples taken after fasting for at least 8 hours.  . BUN 07/13/2019 9  6 - 20 mg/dL Final  . Creatinine, Ser 07/13/2019 0.74  0.61 - 1.24 mg/dL Final  . Calcium 07/13/2019 8.7* 8.9 - 10.3 mg/dL Final  . Total Protein 07/13/2019 8.6* 6.5 - 8.1 g/dL Final  . Albumin 07/13/2019 3.6  3.5 - 5.0 g/dL Final  . AST 07/13/2019 113* 15 - 41 U/L Final  . ALT 07/13/2019 30  0 - 44 U/L Final  . Alkaline Phosphatase 07/13/2019 181* 38 - 126 U/L Final  . Total Bilirubin 07/13/2019 0.6  0.3 - 1.2 mg/dL Final  . GFR calc non Af Amer 07/13/2019 >60  >60 mL/min Final  . GFR calc Af Amer 07/13/2019 >60  >60 mL/min Final  . Anion gap 07/13/2019 10  5 - 15 Final   Performed at [facility], [place] [address], [place],  [address]  . WBC 07/13/2019 4.5  4.0 - 10.5 K/uL Final  . RBC 07/13/2019 3.83* 4.22 - 5.81 MIL/uL Final  . Hemoglobin 07/13/2019 8.7* 13.0 - 17.0 g/dL Final   Comment: Reticulocyte Hemoglobin testing may be clinically indicated, consider ordering this additional test [id number]   . HCT 07/13/2019 29.3* 39 - 52 % Final  . MCV 07/13/2019 76.5* 80.0 - 100.0 fL Final  . MCH 07/13/2019 22.7* 26.0 - 34.0 pg Final  . MCHC 07/13/2019 29.7* 30.0 - 36.0 g/dL Final  . RDW 07/13/2019 24.7* 11.5 - 15.5 % Final  . Platelets 07/13/2019 128* 150 - 400 K/uL Final  . nRBC 07/13/2019 0.0  0.0 - 0.2 % Final  . Neutrophils Relative % 07/13/2019 47  % Final  . Neutro  Abs 07/13/2019 2.1  1.7 - 7.7 K/uL Final  . Lymphocytes Relative 07/13/2019 35  % Final  . Lymphs Abs 07/13/2019 1.6  0.7 - 4.0 K/uL Final  . Monocytes Relative 07/13/2019 13  % Final  . Monocytes Absolute 07/13/2019 0.6  0 - 1 K/uL Final  . Eosinophils Relative 07/13/2019 3  % Final  . Eosinophils Absolute 07/13/2019 0.1  0 - 0 K/uL Final  . Basophils Relative 07/13/2019 2  % Final  . Basophils Absolute 07/13/2019 0.1  0 - 0 K/uL Final  . Immature Granulocytes 07/13/2019 0  % Final  . Abs Immature Granulocytes 07/13/2019 0.00  0.00 - 0.07 K/uL Final   Performed  at [facility], [place] [address] [place], [place] [address]  . Alcohol, Ethyl (B) 07/13/2019 351* <10 mg/dL Final   Comment: CRITICAL RESULT CALLED TO, READ BACK BY AND VERIFIED WITH: J,NASH AT 2323 ON 07/13/19 BY A,MOHAMED CALLED X3 TO GIVE CRITICAL (NOTE) Lowest detectable limit for serum alcohol is 10 mg/dL. For medical purposes only. Performed at [facility], [place] [address], [place], [place] [address]   . Fecal Occult Bld 07/13/2019 POSITIVE* NEGATIVE Final  . Prothrombin Time 07/13/2019 13.7  11.4 - 15.2 seconds Final  . INR 07/13/2019 1.1  0.8 - 1.2 Final   Comment: (NOTE) INR goal varies based on device and disease states. Performed at [facility], [place] [address] [place], [place] [address]   . ABO/RH(D) 07/13/2019 O POS   Final  . Antibody Screen 07/13/2019 NEG   Final  . Sample Expiration 07/13/2019    Final                   Value:07/16/2019,2359 Performed at [facility], [place] [address] [place], [place] [address]   . SARS Coronavirus 2 by RT PCR 07/14/2019 NEGATIVE  NEGATIVE Final   Comment: (NOTE) SARS-CoV-2 target nucleic acids are NOT DETECTED. The SARS-CoV-2 RNA is generally detectable in upper respiratoy specimens during the acute phase of infection. The lowest concentration of SARS-CoV-2 viral copies this assay can detect  is 131 copies/mL. A negative result does not preclude SARS-Cov-2 infection and should not be used as the sole basis for treatment or other patient management decisions. A negative result may occur with  improper specimen collection/handling, submission of specimen other than nasopharyngeal swab, presence of viral mutation(s) within the areas targeted by this assay, and inadequate number of viral copies (<131 copies/mL). A negative result must be combined with clinical observations, patient history, and epidemiological information. The expected result is Negative. Fact Sheet for Patients:  PinkCheek.be Fact Sheet for Healthcare Providers:  GravelBags.it This test is not yet ap                          proved or cleared by the [place] FDA and  has been authorized for detection and/or diagnosis of SARS-CoV-2 by FDA under an Emergency Use Authorization (EUA). This EUA will remain  in effect (meaning this test can be used) for the duration of the COVID-19 declaration under Section 564(b)(1) of the Act, 21 U.S.C. section 360bbb-3(b)(1), unless the authorization is terminated or revoked sooner.   . Influenza A by PCR 07/14/2019 NEGATIVE  NEGATIVE Final  . Influenza B by PCR 07/14/2019 NEGATIVE  NEGATIVE Final   Comment: (NOTE) The Xpert Xpress SARS-CoV-2/FLU/RSV assay is intended as an aid in  the diagnosis of influenza from Nasopharyngeal swab specimens and  should not be used as a sole basis for treatment. Nasal washings and  aspirates are unacceptable for Xpert Xpress SARS-CoV-2/FLU/RSV  testing. Fact Sheet for Patients: PinkCheek.be Fact Sheet for Healthcare Providers: GravelBags.it This test is not yet approved or cleared by the [place] FDA and  has been authorized for detection and/or diagnosis of SARS-CoV-2 by  FDA under an Emergency Use Authorization  (EUA). This EUA will remain  in effect (meaning this test can be used) for the duration of the  Covid-19 declaration under Section 564(b)(1) of the Act, 21  U.S.C. section 360bbb-3(b)(1), unless the authorization is  terminated or revoked. Performed at [facility], [address] [place], [place] [address]   . Sodium 07/14/2019 140  135 - 145 mmol/L Final  . Potassium 07/14/2019 4.0  3.5 - 5.1 mmol/L Final  . Chloride 07/14/2019 104  98 - 111 mmol/L Final  . CO2 07/14/2019 24  22 - 32 mmol/L Final  . Glucose, Bld 07/14/2019 88  70 - 99 mg/dL Final   Glucose reference range applies only to samples taken after fasting for at least 8 hours.  . BUN 07/14/2019 11  6 - 20 mg/dL Final  . Creatinine, Ser 07/14/2019 0.63  0.61 - 1.24 mg/dL Final  . Calcium 07/14/2019 8.5* 8.9 - 10.3 mg/dL Final  . Total Protein 07/14/2019 7.6  6.5 - 8.1 g/dL Final  . Albumin 07/14/2019 3.4* 3.5 - 5.0 g/dL Final  . AST 07/14/2019 96* 15 - 41 U/L Final  . ALT 07/14/2019 27  0 - 44 U/L Final  . Alkaline Phosphatase 07/14/2019 159* 38 - 126 U/L Final  . Total Bilirubin 07/14/2019 0.6  0.3 - 1.2 mg/dL Final  . GFR calc non Af Amer 07/14/2019 >60  >60 mL/min Final  . GFR calc Af Amer 07/14/2019 >60  >60 mL/min Final  . Anion gap 07/14/2019 12  5 - 15 Final   Performed at [facility], [place] [address] [place], [place] [address]  . Magnesium 07/14/2019 2.3  1.7 - 2.4 mg/dL Final   Performed at [place] [address], [place], [place] [address]  . Phosphorus 07/14/2019 3.9  2.5 - 4.6 mg/dL Final   Performed at [place] [address] [place], [place] [address]  . WBC 07/14/2019 3.4* 4.0 - 10.5 K/uL Final  . RBC 07/14/2019 3.42* 4.22 - 5.81 MIL/uL Final  . Hemoglobin 07/14/2019 7.8* 13.0 - 17.0 g/dL Final   Comment: Reticulocyte Hemoglobin testing may be clinically indicated, consider ordering this additional test [id number]    . HCT 07/14/2019 26.3* 39 - 52 % Final  . MCV 07/14/2019 76.9* 80.0 - 100.0 fL Final  . MCH 07/14/2019 22.8* 26.0 - 34.0 pg Final  . MCHC 07/14/2019 29.7* 30.0 - 36.0 g/dL Final  . RDW 07/14/2019 24.7* 11.5 - 15.5 % Final  . Platelets 07/14/2019 118* 150 - 400 K/uL Final   Comment: SPECIMEN CHECKED FOR CLOTS Immature Platelet Fraction may be clinically indicated, consider ordering this additional test [id number] PLATELET COUNT CONFIRMED BY SMEAR REPEATED TO VERIFY   . nRBC 07/14/2019 0.0  0.0 - 0.2 % Final   Performed at [place] [address] [place], [place] [address]  . Hemoglobin 07/14/2019 7.8* 13.0 - 17.0 g/dL Final   Performed at [facility], [place] [address], [place], [place] [address]  . Hemoglobin 07/14/2019 7.8* 13.0 - 17.0 g/dL Final   Performed at [facility], [place] [address], [place], [place] [address]  . HCT 07/14/2019 26.0* 39 - 52 % Final   Performed at [facility], [place] [address], [place], [place] [address]  . HCT 07/14/2019 26.3* 39 - 52 % Final   Performed at [facility], [place] [address], [place],  [address]  . WBC 07/15/2019 4.0  4.0 - 10.5 K/uL Final  . RBC 07/15/2019 3.57* 4.22 - 5.81 MIL/uL Final  . Hemoglobin 07/15/2019 8.2* 13.0 - 17.0 g/dL Final   Comment: Reticulocyte Hemoglobin testing may be clinically indicated, consider ordering this additional test [id number]   . HCT 07/15/2019 27.4* 39 -  52 % Final  . MCV 07/15/2019 76.8* 80.0 - 100.0 fL Final  . MCH 07/15/2019 23.0* 26.0 - 34.0 pg Final  . MCHC 07/15/2019 29.9* 30.0 - 36.0 g/dL Final  . RDW 07/15/2019 24.2* 11.5 - 15.5 % Final  . Platelets 07/15/2019 96* 150 - 400 K/uL Final   Comment: CONSISTENT WITH PREVIOUS RESULT Immature Platelet Fraction may be clinically indicated, consider ordering this additional test [id number] REPEATED TO VERIFY   . nRBC 07/15/2019 0.0  0.0 - 0.2 % Final    Performed at [place] [address], [place], [place] [address]  . Sodium 07/15/2019 135  135 - 145 mmol/L Final  . Potassium 07/15/2019 3.7  3.5 - 5.1 mmol/L Final  . Chloride 07/15/2019 102  98 - 111 mmol/L Final  . CO2 07/15/2019 24  22 - 32 mmol/L Final  . Glucose, Bld 07/15/2019 92  70 - 99 mg/dL Final   Glucose reference range applies only to samples taken after fasting for at least 8 hours.  . BUN 07/15/2019 11  6 - 20 mg/dL Final  . Creatinine, Ser 07/15/2019 0.73  0.61 - 1.24 mg/dL Final  . Calcium 07/15/2019 8.7* 8.9 - 10.3 mg/dL Final  . Total Protein 07/15/2019 7.8  6.5 - 8.1 g/dL Final  . Albumin 07/15/2019 3.2* 3.5 - 5.0 g/dL Final  . AST 07/15/2019 95* 15 - 41 U/L Final  . ALT 07/15/2019 28  0 - 44 U/L Final  . Alkaline Phosphatase 07/15/2019 174* 38 - 126 U/L Final  . Total Bilirubin 07/15/2019 1.7* 0.3 - 1.2 mg/dL Final  . GFR calc non Af Amer 07/15/2019 >60  >60 mL/min Final  . GFR calc Af Amer 07/15/2019 >60  >60 mL/min Final  . Anion gap 07/15/2019 9  5 - 15 Final   Performed at [facility], [place] [address] [place], [place] [address]  . Ammonia 07/15/2019 48* 9 - 35 umol/L Final   Performed at [facility], [place] [address], [place], [place] [address]  Admission on 07/05/2019, Discharged on 07/06/2019  Component Date Value Ref Range Status  . Sodium 07/05/2019 128* 135 - 145 mmol/L Final  . Potassium 07/05/2019 3.3* 3.5 - 5.1 mmol/L Final  . Chloride 07/05/2019 96* 98 - 111 mmol/L Final  . CO2 07/05/2019 20* 22 - 32 mmol/L Final  . Glucose, Bld 07/05/2019 93  70 - 99 mg/dL Final   Glucose reference range applies only to samples taken after fasting for at least 8 hours.  . BUN 07/05/2019 5* 6 - 20 mg/dL Final  . Creatinine, Ser 07/05/2019 0.68  0.61 - 1.24 mg/dL Final  . Calcium 07/05/2019 9.1  8.9 - 10.3 mg/dL Final  . Total Protein 07/05/2019 8.4* 6.5 - 8.1 g/dL Final  . Albumin 07/05/2019 3.8  3.5 -  5.0 g/dL Final  . AST 07/05/2019 75* 15 - 41 U/L Final  . ALT 07/05/2019 28  0 - 44 U/L Final  . Alkaline Phosphatase 07/05/2019 156* 38 - 126 U/L Final  . Total Bilirubin 07/05/2019 1.2  0.3 - 1.2 mg/dL Final  . GFR calc non Af Amer 07/05/2019 >60  >60 mL/min Final  . GFR calc Af Amer 07/05/2019 >60  >60 mL/min Final  . Anion gap 07/05/2019 12  5 - 15 Final   Performed at [facility], [place] [address], [place], [place] [address]  . Alcohol, Ethyl (B) 07/05/2019 275* <10 mg/dL Final   Comment: (NOTE) Lowest  detectable limit for serum alcohol is 10 mg/dL. For medical purposes only. Performed at [facility], [place] [address] [place], [place] [address]   . Salicylate Lvl 24/82/5003 <7.0* 7.0 - 30.0 mg/dL Final   Performed at [place] [address] [place], [place] [address]  . Acetaminophen (Tylenol), Serum 07/05/2019 <10* 10 - 30 ug/mL Final   Comment: (NOTE) Therapeutic concentrations vary significantly. A range of 10-30 ug/mL  may be an effective concentration for many patients. However, some  are best treated at concentrations outside of this range. Acetaminophen concentrations >150 ug/mL at 4 hours after ingestion  and >50 ug/mL at 12 hours after ingestion are often associated with  toxic reactions. Performed at [facility], [place] [address] [place], [place] [address]   . Opiates 07/05/2019 NONE DETECTED  NONE DETECTED Final  . Cocaine 07/05/2019 NONE DETECTED  NONE DETECTED Final  . Benzodiazepines 07/05/2019 NONE DETECTED  NONE DETECTED Final  . Amphetamines 07/05/2019 NONE DETECTED  NONE DETECTED Final  . Tetrahydrocannabinol 07/05/2019 NONE DETECTED  NONE DETECTED Final  . Barbiturates 07/05/2019 NONE DETECTED  NONE DETECTED Final   Comment: (NOTE) DRUG SCREEN FOR MEDICAL PURPOSES ONLY.  IF CONFIRMATION IS NEEDED FOR ANY PURPOSE, NOTIFY LAB WITHIN 5 DAYS. LOWEST DETECTABLE LIMITS FOR URINE  DRUG SCREEN Drug Class                     Cutoff (ng/mL) Amphetamine and metabolites    1000 Barbiturate and metabolites    200 Benzodiazepine                 450 Tricyclics and metabolites     300 Opiates and metabolites        300 Cocaine and metabolites        300 THC                            50 Performed at [facility], [place] [address], [place], [place] [address]   . WBC 07/05/2019 5.1  4.0 - 10.5 K/uL Final  . RBC 07/05/2019 3.91* 4.22 - 5.81 MIL/uL Final  . Hemoglobin 07/05/2019 8.9* 13.0 - 17.0 g/dL Final   Comment: Reticulocyte Hemoglobin testing may be clinically indicated, consider ordering this additional test [id number]   . HCT 07/05/2019 29.3* 39 - 52 % Final  . MCV 07/05/2019 74.9* 80.0 - 100.0 fL Final  . MCH 07/05/2019 22.8* 26.0 - 34.0 pg Final  . MCHC 07/05/2019 30.4  30.0 - 36.0 g/dL Final  . RDW 07/05/2019 23.9* 11.5 - 15.5 % Final  . Platelets 07/05/2019 118* 150 - 400 K/uL Final   Comment: SPECIMEN CHECKED FOR CLOTS Immature Platelet Fraction may be clinically indicated, consider ordering this additional test [id number] PLATELET COUNT CONFIRMED BY SMEAR REPEATED TO VERIFY   . nRBC 07/05/2019 0.0  0.0 - 0.2 % Final  . Neutrophils Relative % 07/05/2019 49  % Final  . Neutro Abs 07/05/2019 2.5  1.7 - 7.7 K/uL Final  . Lymphocytes Relative 07/05/2019 32  % Final  . Lymphs Abs 07/05/2019 1.6  0.7 - 4.0 K/uL Final  . Monocytes Relative 07/05/2019 16  % Final  . Monocytes Absolute 07/05/2019 0.8  0 - 1 K/uL Final  . Eosinophils Relative 07/05/2019 2  % Final  . Eosinophils Absolute 07/05/2019 0.1  0 - 0 K/uL Final  . Basophils Relative 07/05/2019 1  % Final  .  Basophils Absolute 07/05/2019 0.0  0 - 0 K/uL Final  . Immature Granulocytes 07/05/2019 0  % Final  . Abs Immature Granulocytes 07/05/2019 0.02  0.00 - 0.07 K/uL Final   Performed at [facility], [place] [address], [place], [place] [address]  Admission on  07/01/2019, Discharged on 07/04/2019  Component Date Value Ref Range Status  . WBC 07/01/2019 4.3  4.0 - 10.5 K/uL Final  . RBC 07/01/2019 3.35* 4.22 - 5.81 MIL/uL Final  . Hemoglobin 07/01/2019 6.8* 13.0 - 17.0 g/dL Final   Comment: REPEATED TO VERIFY Reticulocyte Hemoglobin testing may be clinically indicated, consider ordering this additional test [id number] THIS CRITICAL RESULT HAS VERIFIED AND BEEN CALLED TO E HODGES,RN BY JACQUELINE HOLMES ON 04 12 2021 AT 0003, AND HAS BEEN READ BACK. CRITICAL RESULTS VERIFIED   . HCT 07/01/2019 24.1* 39 - 52 % Final  . MCV 07/01/2019 71.9* 80.0 - 100.0 fL Final  . MCH 07/01/2019 20.3* 26.0 - 34.0 pg Final  . MCHC 07/01/2019 28.2* 30.0 - 36.0 g/dL Final  . RDW 07/01/2019 21.0* 11.5 - 15.5 % Final  . Platelets 07/01/2019 75* 150 - 400 K/uL Final   Comment: Immature Platelet Fraction may be clinically indicated, consider ordering this additional test [id number] PLATELET COUNT CONFIRMED BY SMEAR REPEATED TO VERIFY   . nRBC 07/01/2019 0.0  0.0 - 0.2 % Final  . Neutrophils Relative % 07/01/2019 55  % Final  . Neutro Abs 07/01/2019 2.3  1.7 - 7.7 K/uL Final  . Lymphocytes Relative 07/01/2019 29  % Final  . Lymphs Abs 07/01/2019 1.3  0.7 - 4.0 K/uL Final  . Monocytes Relative 07/01/2019 12  % Final  . Monocytes Absolute 07/01/2019 0.5  0 - 1 K/uL Final  . Eosinophils Relative 07/01/2019 3  % Final  . Eosinophils Absolute 07/01/2019 0.1  0 - 0 K/uL Final  . Basophils Relative 07/01/2019 1  % Final  . Basophils Absolute 07/01/2019 0.1  0 - 0 K/uL Final  . Immature Granulocytes 07/01/2019 0  % Final  . Abs Immature Granulocytes 07/01/2019 0.01  0.00 - 0.07 K/uL Final   Performed at [facility], [place] [address] [place], [place] [address]  . Sodium 07/01/2019 136  135 - 145 mmol/L Final  . Potassium 07/01/2019 3.8  3.5 - 5.1 mmol/L Final  . Chloride 07/01/2019 102  98 - 111 mmol/L Final  . CO2 07/01/2019 25  22 - 32 mmol/L Final   . Glucose, Bld 07/01/2019 100* 70 - 99 mg/dL Final   Glucose reference range applies only to samples taken after fasting for at least 8 hours.  . BUN 07/01/2019 <5* 6 - 20 mg/dL Final  . Creatinine, Ser 07/01/2019 0.60* 0.61 - 1.24 mg/dL Final  . Calcium 07/01/2019 8.6* 8.9 - 10.3 mg/dL Final  . GFR calc non Af Amer 07/01/2019 >60  >60 mL/min Final  . GFR calc Af Amer 07/01/2019 >60  >60 mL/min Final  . Anion gap 07/01/2019 9  5 - 15 Final   Performed at [facility], [place] [address], [place], [place] [address]  . Total Protein 07/01/2019 8.2* 6.5 - 8.1 g/dL Final  . Albumin 07/01/2019 3.7  3.5 - 5.0 g/dL Final  . AST 07/01/2019 118* 15 - 41 U/L Final  . ALT 07/01/2019 32  0 - 44 U/L Final  . Alkaline Phosphatase 07/01/2019 141* 38 - 126 U/L Final  . Total Bilirubin 07/01/2019 0.7  0.3 - 1.2 mg/dL  Final  . Bilirubin, Direct 07/01/2019 0.3* 0.0 - 0.2 mg/dL Final  . Indirect Bilirubin 07/01/2019 0.4  0.3 - 0.9 mg/dL Final   Performed at [facility], [place] [address] [place], [place] [address]  . Prothrombin Time 07/01/2019 14.3  11.4 - 15.2 seconds Final  . INR 07/01/2019 1.1  0.8 - 1.2 Final   Comment: (NOTE) INR goal varies based on device and disease states. Performed at [facility], [place] [address] [place], [place] [address]   . Lipase 07/01/2019 50  11 - 51 U/L Final   Performed at [facility], [place] [address] [place], [place] [address]  . Ammonia 07/01/2019 29  9 - 35 umol/L Final   Performed at [facility], [place] [address] [place], [place] [address]  . Alcohol, Ethyl (B) 07/01/2019 295* <10 mg/dL Final   Comment: (NOTE) Lowest detectable limit for serum alcohol is 10 mg/dL. For medical purposes only. Performed at [facility], [place] [address], [place], [place] [address]   . Sodium 07/01/2019 137  135 - 145 mmol/L Final  . Potassium 07/01/2019 3.8  3.5 - 5.1  mmol/L Final  . Chloride 07/01/2019 101  98 - 111 mmol/L Final  . BUN 07/01/2019 <3* 6 - 20 mg/dL Final  . Creatinine, Ser 07/01/2019 1.00  0.61 - 1.24 mg/dL Final  . Glucose, Bld 07/01/2019 93  70 - 99 mg/dL Final   Glucose reference range applies only to samples taken after fasting for at least 8 hours.  . Calcium, Ion 07/01/2019 1.02* 1.15 - 1.40 mmol/L Final  . TCO2 07/01/2019 27  22 - 32 mmol/L Final  . Hemoglobin 07/01/2019 8.8* 13.0 - 17.0 g/dL Final  . HCT 07/01/2019 26.0* 39 - 52 % Final  . ABO/RH(D) 07/02/2019 O POS   Final  . Antibody Screen 07/02/2019 NEG   Final  . Sample Expiration 07/02/2019 07/05/2019,2359   Final  . Unit Number 07/02/2019 [id number]   Final  . Blood Component Type 07/02/2019 RBC LR PHER2   Final  . Unit division 07/02/2019 00   Final  . Status of Unit 07/02/2019 ISSUED,FINAL   Final  . Transfusion Status 07/02/2019 OK TO TRANSFUSE   Final  . Crossmatch Result 07/02/2019 Compatible   Final  . Unit Number 07/02/2019 [id number]   Final  . Blood Component Type 07/02/2019 RED CELLS,LR   Final  . Unit division 07/02/2019 00   Final  . Status of Unit 07/02/2019 ISSUED,FINAL   Final  . Transfusion Status 07/02/2019 OK TO TRANSFUSE   Final  . Crossmatch Result 07/02/2019    Final                   Value:Compatible Performed at [facility], [place] [address], [place], [place] [address]   . Order Confirmation 07/02/2019    Final                   Value:ORDER PROCESSED BY BLOOD BANK Performed at [facility], [place] [address], [place], [place] [address]   . Fecal Occult Bld 07/02/2019 POSITIVE* NEGATIVE Final  . SARS Coronavirus 2 by RT PCR 07/02/2019 NEGATIVE  NEGATIVE Final   Comment: (NOTE) SARS-CoV-2 target nucleic acids are NOT DETECTED. The SARS-CoV-2 RNA is generally detectable in upper respiratoy specimens during the acute phase of infection. The lowest concentration of SARS-CoV-2 viral copies this assay can  detect is 131 copies/mL. A negative result  does not preclude SARS-Cov-2 infection and should not be used as the sole basis for treatment or other patient management decisions. A negative result may occur with  improper specimen collection/handling, submission of specimen other than nasopharyngeal swab, presence of viral mutation(s) within the areas targeted by this assay, and inadequate number of viral copies (<131 copies/mL). A negative result must be combined with clinical observations, patient history, and epidemiological information. The expected result is Negative. Fact Sheet for Patients:  PinkCheek.be Fact Sheet for Healthcare Providers:  GravelBags.it This test is not yet ap                          proved or cleared by the [place] FDA and  has been authorized for detection and/or diagnosis of SARS-CoV-2 by FDA under an Emergency Use Authorization (EUA). This EUA will remain  in effect (meaning this test can be used) for the duration of the COVID-19 declaration under Section 564(b)(1) of the Act, 21 U.S.C. section 360bbb-3(b)(1), unless the authorization is terminated or revoked sooner.   . Influenza A by PCR 07/02/2019 NEGATIVE  NEGATIVE Final  . Influenza B by PCR 07/02/2019 NEGATIVE  NEGATIVE Final   Comment: (NOTE) The Xpert Xpress SARS-CoV-2/FLU/RSV assay is intended as an aid in  the diagnosis of influenza from Nasopharyngeal swab specimens and  should not be used as a sole basis for treatment. Nasal washings and  aspirates are unacceptable for Xpert Xpress SARS-CoV-2/FLU/RSV  testing. Fact Sheet for Patients: PinkCheek.be Fact Sheet for Healthcare Providers: GravelBags.it This test is not yet approved or cleared by the [place] FDA and  has been authorized for detection and/or diagnosis of SARS-CoV-2 by  FDA under an Emergency Use  Authorization (EUA). This EUA will remain  in effect (meaning this test can be used) for the duration of the  Covid-19 declaration under Section 564(b)(1) of the Act, 21  U.S.C. section 360bbb-3(b)(1), unless the authorization is  terminated or revoked. Performed at [facility], [place] [address], [place], [place] [address]   . ISSUE DATE / TIME 07/02/2019 [id number]   Final  . Blood Product Unit Number 07/02/2019 [id number]   Final  . PRODUCT CODE 07/02/2019 [id number]   Final  . Unit Type and Rh 07/02/2019 5100   Final  . Blood Product Expiration Date 07/02/2019 [id number]   Final  . ISSUE DATE / TIME 07/02/2019 [id number]   Final  . Blood Product Unit Number 07/02/2019 [id number]   Final  . PRODUCT CODE 07/02/2019 [id number]   Final  . Unit Type and Rh 07/02/2019 5100   Final  . Blood Product Expiration Date 07/02/2019 [id number]   Final  . Magnesium 07/02/2019 1.8  1.7 - 2.4 mg/dL Final   Performed at [facility], [place] [address] [place], [place] [address]  . Phosphorus 07/02/2019 3.4  2.5 - 4.6 mg/dL Final   Performed at [place] [address], [place], [place] [address]  . Sodium 07/02/2019 134* 135 - 145 mmol/L Final  . Potassium 07/02/2019 4.2  3.5 - 5.1 mmol/L Final  . Chloride 07/02/2019 99  98 - 111 mmol/L Final  . CO2 07/02/2019 25  22 - 32 mmol/L Final  . Glucose, Bld 07/02/2019 93  70 - 99 mg/dL Final   Glucose reference range applies only to samples taken after fasting for at least 8 hours.  . BUN 07/02/2019 6  6 - 20 mg/dL Final  . Creatinine,  Ser 07/02/2019 0.73  0.61 - 1.24 mg/dL Final  . Calcium 07/02/2019 8.3* 8.9 - 10.3 mg/dL Final  . Total Protein 07/02/2019 8.0  6.5 - 8.1 g/dL Final  . Albumin 07/02/2019 3.5  3.5 - 5.0 g/dL Final  . AST 07/02/2019 91* 15 - 41 U/L Final  . ALT 07/02/2019 29  0 - 44 U/L Final  . Alkaline Phosphatase 07/02/2019 138* 38 - 126 U/L Final  . Total Bilirubin  07/02/2019 1.6* 0.3 - 1.2 mg/dL Final  . GFR calc non Af Amer 07/02/2019 >60  >60 mL/min Final  . GFR calc Af Amer 07/02/2019 >60  >60 mL/min Final  . Anion gap 07/02/2019 10  5 - 15 Final   Performed at [place], [place] [address], [place], [place] [address]  . WBC 07/02/2019 5.4  4.0 - 10.5 K/uL Final  . RBC 07/02/2019 3.91* 4.22 - 5.81 MIL/uL Final  . Hemoglobin 07/02/2019 8.8* 13.0 - 17.0 g/dL Final   Comment: REPEATED TO VERIFY POST TRANSFUSION SPECIMEN   . HCT 07/02/2019 29.2* 39 - 52 % Final  . MCV 07/02/2019 74.7* 80.0 - 100.0 fL Final  . MCH 07/02/2019 22.5* 26.0 - 34.0 pg Final  . MCHC 07/02/2019 30.1  30.0 - 36.0 g/dL Final  . RDW 07/02/2019 22.5* 11.5 - 15.5 % Final  . Platelets 07/02/2019 69* 150 - 400 K/uL Final   Comment: SPECIMEN CHECKED FOR CLOTS Immature Platelet Fraction may be clinically indicated, consider ordering this additional test [id number] PLATELET COUNT CONFIRMED BY SMEAR REPEATED TO VERIFY   . nRBC 07/02/2019 0.0  0.0 - 0.2 % Final  . Neutrophils Relative % 07/02/2019 62  % Final  . Neutro Abs 07/02/2019 3.3  1.7 - 7.7 K/uL Final  . Lymphocytes Relative 07/02/2019 20  % Final  . Lymphs Abs 07/02/2019 1.1  0.7 - 4.0 K/uL Final  . Monocytes Relative 07/02/2019 15  % Final  . Monocytes Absolute 07/02/2019 0.8  0 - 1 K/uL Final  . Eosinophils Relative 07/02/2019 2  % Final  . Eosinophils Absolute 07/02/2019 0.1  0 - 0 K/uL Final  . Basophils Relative 07/02/2019 1  % Final  . Basophils Absolute 07/02/2019 0.1  0 - 0 K/uL Final  . Immature Granulocytes 07/02/2019 0  % Final  . Abs Immature Granulocytes 07/02/2019 0.01  0.00 - 0.07 K/uL Final  . Polychromasia 07/02/2019 PRESENT   Final  . Target Cells 07/02/2019 PRESENT   Final  . Ovalocytes 07/02/2019 PRESENT   Final   Performed at [facility], [place] [address] [place], [place] [address]  . WBC 07/03/2019 4.7  4.0 - 10.5 K/uL Final  . RBC 07/03/2019 3.97* 4.22 -  5.81 MIL/uL Final  . Hemoglobin 07/03/2019 8.9* 13.0 - 17.0 g/dL Final   Comment: Reticulocyte Hemoglobin testing may be clinically indicated, consider ordering this additional test [id number]   . HCT 07/03/2019 29.4* 39 - 52 % Final  . MCV 07/03/2019 74.1* 80.0 - 100.0 fL Final  . MCH 07/03/2019 22.4* 26.0 - 34.0 pg Final  . MCHC 07/03/2019 30.3  30.0 - 36.0 g/dL Final  . RDW 07/03/2019 22.6* 11.5 - 15.5 % Final  . Platelets 07/03/2019 69* 150 - 400 K/uL Final   Comment: Immature Platelet Fraction may be clinically indicated, consider ordering this additional test [id number]   . nRBC 07/03/2019 0.0  0.0 - 0.2 % Final  . Neutrophils Relative % 07/03/2019 59  % Final  . Neutro Abs 07/03/2019  2.8  1.7 - 7.7 K/uL Final  . Lymphocytes Relative 07/03/2019 22  % Final  . Lymphs Abs 07/03/2019 1.0  0.7 - 4.0 K/uL Final  . Monocytes Relative 07/03/2019 16  % Final  . Monocytes Absolute 07/03/2019 0.7  0 - 1 K/uL Final  . Eosinophils Relative 07/03/2019 2  % Final  . Eosinophils Absolute 07/03/2019 0.1  0 - 0 K/uL Final  . Basophils Relative 07/03/2019 1  % Final  . Basophils Absolute 07/03/2019 0.0  0 - 0 K/uL Final  . Immature Granulocytes 07/03/2019 0  % Final  . Abs Immature Granulocytes 07/03/2019 0.02  0.00 - 0.07 K/uL Final  . Target Cells 07/03/2019 PRESENT   Final   Performed at [facility], [place] [address] [place], [place] [address]  . Sodium 07/03/2019 132* 135 - 145 mmol/L Final  . Potassium 07/03/2019 3.4* 3.5 - 5.1 mmol/L Final  . Chloride 07/03/2019 95* 98 - 111 mmol/L Final  . CO2 07/03/2019 24  22 - 32 mmol/L Final  . Glucose, Bld 07/03/2019 113* 70 - 99 mg/dL Final   Glucose reference range applies only to samples taken after fasting for at least 8 hours.  . BUN 07/03/2019 8  6 - 20 mg/dL Final  . Creatinine, Ser 07/03/2019 0.64  0.61 - 1.24 mg/dL Final  . Calcium 07/03/2019 8.6* 8.9 - 10.3 mg/dL Final  . GFR calc non Af Amer 07/03/2019 >60  >60  mL/min Final  . GFR calc Af Amer 07/03/2019 >60  >60 mL/min Final  . Anion gap 07/03/2019 13  5 - 15 Final   Performed at [facility], [place] [address], [place], [place] [address]  . Hemoglobin 07/04/2019 9.4* 13.0 - 17.0 g/dL Final  . HCT 07/04/2019 30.7* 39 - 52 % Final   Performed at [facility], [place] [address], [place], [place] [address]  Admission on 06/21/2019, Discharged on 06/22/2019  Component Date Value Ref Range Status  . WBC 06/21/2019 5.7  4.0 - 10.5 K/uL Final  . RBC 06/21/2019 3.53* 4.22 - 5.81 MIL/uL Final  . Hemoglobin 06/21/2019 7.0* 13.0 - 17.0 g/dL Final   Comment: Reticulocyte Hemoglobin testing may be clinically indicated, consider ordering this additional test [id number]   . HCT 06/21/2019 25.4* 39 - 52 % Final  . MCV 06/21/2019 72.0* 80.0 - 100.0 fL Final  . MCH 06/21/2019 19.8* 26.0 - 34.0 pg Final  . MCHC 06/21/2019 27.6* 30.0 - 36.0 g/dL Final  . RDW 06/21/2019 20.9* 11.5 - 15.5 % Final  . Platelets 06/21/2019 95* 150 - 400 K/uL Final   Comment: Immature Platelet Fraction may be clinically indicated, consider ordering this additional test [id number]   . nRBC 06/21/2019 0.0  0.0 - 0.2 % Final  . Neutrophils Relative % 06/21/2019 58  % Final  . Neutro Abs 06/21/2019 3.3  1.7 - 7.7 K/uL Final  . Lymphocytes Relative 06/21/2019 25  % Final  . Lymphs Abs 06/21/2019 1.4  0.7 - 4.0 K/uL Final  . Monocytes Relative 06/21/2019 14  % Final  . Monocytes Absolute 06/21/2019 0.8  0 - 1 K/uL Final  . Eosinophils Relative 06/21/2019 2  % Final  . Eosinophils Absolute 06/21/2019 0.1  0 - 0 K/uL Final  . Basophils Relative 06/21/2019 1  % Final  . Basophils Absolute 06/21/2019 0.1  0 - 0 K/uL Final  . Immature Granulocytes 06/21/2019 0  % Final  . Abs Immature Granulocytes 06/21/2019 0.02  0.00 -  0.07 K/uL Final   Performed at [facility], [place] [address], [place], [place] [address]  . Sodium 06/21/2019 138  135 - 145 mmol/L  Final  . Potassium 06/21/2019 3.8  3.5 - 5.1 mmol/L Final  . Chloride 06/21/2019 104  98 - 111 mmol/L Final  . CO2 06/21/2019 22  22 - 32 mmol/L Final  . Glucose, Bld 06/21/2019 94  70 - 99 mg/dL Final   Glucose reference range applies only to samples taken after fasting for at least 8 hours.  . BUN 06/21/2019 <5* 6 - 20 mg/dL Final  . Creatinine, Ser 06/21/2019 0.61  0.61 - 1.24 mg/dL Final  . Calcium 06/21/2019 8.7* 8.9 - 10.3 mg/dL Final  . Total Protein 06/21/2019 8.1  6.5 - 8.1 g/dL Final  . Albumin 06/21/2019 3.5  3.5 - 5.0 g/dL Final  . AST 06/21/2019 67* 15 - 41 U/L Final  . ALT 06/21/2019 23  0 - 44 U/L Final  . Alkaline Phosphatase 06/21/2019 136* 38 - 126 U/L Final  . Total Bilirubin 06/21/2019 0.6  0.3 - 1.2 mg/dL Final  . GFR calc non Af Amer 06/21/2019 >60  >60 mL/min Final  . GFR calc Af Amer 06/21/2019 >60  >60 mL/min Final  . Anion gap 06/21/2019 12  5 - 15 Final   Performed at [facility], [place] [address], [place], [place] [address]  . Alcohol, Ethyl (B) 06/21/2019 393* <10 mg/dL Final   Comment: CRITICAL RESULT CALLED TO, READ BACK BY AND VERIFIED WITH: K COBB,RN 1801 06/21/2019 WBOND (NOTE) Lowest detectable limit for serum alcohol is 10 mg/dL. For medical purposes only. Performed at [facility], [place] [address], [place], [place] [address]   Admission on 06/15/2019, Discharged on 06/15/2019  Component Date Value Ref Range Status  . WBC 06/15/2019 3.6* 4.0 - 10.5 K/uL Final  . RBC 06/15/2019 3.54* 4.22 - 5.81 MIL/uL Final  . Hemoglobin 06/15/2019 7.2* 13.0 - 17.0 g/dL Final   Comment: Reticulocyte Hemoglobin testing may be clinically indicated, consider ordering this additional test [id number]   . HCT 06/15/2019 25.1* 39 - 52 % Final  . MCV 06/15/2019 70.9* 80.0 - 100.0 fL Final  . MCH 06/15/2019 20.3* 26.0 - 34.0 pg Final  . MCHC 06/15/2019 28.7* 30.0 - 36.0 g/dL Final  . RDW 06/15/2019 20.4* 11.5 - 15.5 % Final  . Platelets 06/15/2019 115* 150  - 400 K/uL Final   Comment: REPEATED TO VERIFY PLATELET COUNT CONFIRMED BY SMEAR SPECIMEN CHECKED FOR CLOTS Immature Platelet Fraction may be clinically indicated, consider ordering this additional test [id number]   . nRBC 06/15/2019 0.0  0.0 - 0.2 % Final  . Neutrophils Relative % 06/15/2019 34  % Final  . Neutro Abs 06/15/2019 1.2* 1.7 - 7.7 K/uL Final  . Lymphocytes Relative 06/15/2019 44  % Final  . Lymphs Abs 06/15/2019 1.6  0.7 - 4.0 K/uL Final  . Monocytes Relative 06/15/2019 17  % Final  . Monocytes Absolute 06/15/2019 0.6  0 - 1 K/uL Final  . Eosinophils Relative 06/15/2019 3  % Final  . Eosinophils Absolute 06/15/2019 0.1  0 - 0 K/uL Final  . Basophils Relative 06/15/2019 2  % Final  . Basophils Absolute 06/15/2019 0.1  0 - 0 K/uL Final  . Immature Granulocytes 06/15/2019 0  % Final  . Abs Immature Granulocytes 06/15/2019 0.01  0.00 - 0.07 K/uL Final  . Polychromasia 06/15/2019 PRESENT   Final  . Target Cells 06/15/2019  PRESENT   Final  . Ovalocytes 06/15/2019 PRESENT   Final   Performed at [facility], [place] [address] [place], [place] [address]  . Sodium 06/15/2019 134* 135 - 145 mmol/L Final  . Potassium 06/15/2019 3.6  3.5 - 5.1 mmol/L Final  . Chloride 06/15/2019 100  98 - 111 mmol/L Final  . CO2 06/15/2019 23  22 - 32 mmol/L Final  . Glucose, Bld 06/15/2019 99  70 - 99 mg/dL Final   Glucose reference range applies only to samples taken after fasting for at least 8 hours.  . BUN 06/15/2019 6  6 - 20 mg/dL Final  . Creatinine, Ser 06/15/2019 0.61  0.61 - 1.24 mg/dL Final  . Calcium 06/15/2019 8.4* 8.9 - 10.3 mg/dL Final  . Total Protein 06/15/2019 8.2* 6.5 - 8.1 g/dL Final  . Albumin 06/15/2019 3.7  3.5 - 5.0 g/dL Final  . AST 06/15/2019 74* 15 - 41 U/L Final  . ALT 06/15/2019 23  0 - 44 U/L Final  . Alkaline Phosphatase 06/15/2019 126  38 - 126 U/L Final  . Total Bilirubin 06/15/2019 0.7  0.3 - 1.2 mg/dL Final  . GFR calc non Af Amer 06/15/2019  >60  >60 mL/min Final  . GFR calc Af Amer 06/15/2019 >60  >60 mL/min Final  . Anion gap 06/15/2019 11  5 - 15 Final   Performed at [facility], [place] [address], [place], [place] [address]  . Lipase 06/15/2019 56* 11 - 51 U/L Final   Performed at [facility], [place] [address] [place], [place] [address]  . Alcohol, Ethyl (B) 06/15/2019 256* <10 mg/dL Final   Comment: (NOTE) Lowest detectable limit for serum alcohol is 10 mg/dL. For medical purposes only. Performed at [facility], [place] [address], [place], [place] [address]   There may be more visits with results that are not included.    Allergies: Tomato, Aspirin, and Sulfa antibiotics  PTA Medications: (Not in a hospital admission)   Medical Decision Making  Initiate CIWA protocol with Ativan tapered dose Resume amlodipine 5 mg daily for HTN Resume folic acid 1 mg daily for nutritional supplementation Resume thiamine 100 mg daily for nutritional supplementation Resume Protonix 40 mg daily for GERD    Clinical Course as of Dec 12 405  Thu Dec 13, 2019  0038 BAL 380 with 2237 blood collection. Patient currently resting. No acute distress noted.  Alcohol, Ethyl (B)(!!): 380 [JB]  0041 HGB has improved in comparison to past several Hgb levels.  Hemoglobin(!): 10.2 [JB]  0042 AST elevated, lower than previous AST levels  AST(!): 75 [JB]  0043 UDS positive for benzodiazepines, otherwise negative  POC Oxazepam (BZO)(!): Positive [JB]    Clinical Course User Index [JB] Rozetta Nunnery, NP    Recommendations  Based on my evaluation the patient does not appear to have an emergency medical condition.   Patient will be placed in the continuous assessment area at [facility] for treatment and stabilization. He will be reevaluated on 12/13/2019. The treatment team will determine disposition at that time.     Rozetta Nunnery, NP 12/13/19  4:07 AM

## 2019-12-12 NOTE — ED Triage Notes (Signed)
Presents with suicidal ideation and alcohol abuse.

## 2019-12-12 NOTE — BH Assessment (Signed)
Comprehensive Clinical Assessment (CCA) Screening, Triage and Referral Note  12/12/2019 Barry Moore [id number]    Barry Moore is a 54 y.o. male who presents to [facility] voluntarily for suicidal thoughts and alcohol intoxication. Pt presents in a wheel-chair and appears to be shaking and per pt chart he has a history of seizure. Pt currently endorses suicidal thoughts stating , " I do want to die, been trying to kill myself". Pt states no specific stressors, states he drank so much today he lost count more than 6 40oz beers, reports he drinks daily. Pt currently denies HI, AVH and SIB. Pt states he is not complying with his medications and is supposed to be taking Depakote and Trazadone for sleep. Pt reports poor sleep last few days and no appetite, poor appetite. Pt reports no drug use. Pt reports current stressors: states he is currently homeless and does not want to live in a shelter does not want to deal with people. Pt also reports he is grieving the loss of 2 friends this week. Pt reports he did go to rehab recently for alcohol use. Pt has no social support. Pt at this time can not contract for safety.  Diagnosis: Alcohol Use Disorder, severe Disposition: Lindon Romp, FNP recommends pt for overnight observation/continual assessment at [facility].  Visit Diagnosis:    ICD-10-CM   1. 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 Alcohol abuse with intoxication [facility])  F10.129     Patient Reported Information How did you hear about [name]? Self   Referral name: self   Referral phone number: No data recorded Whom do you see for routine medical problems? I don't have a doctor   Practice/Facility Name: No data recorded  Practice/Facility Phone Number: No data recorded  Name of Contact: No data recorded  Contact Number: No data recorded  Contact Fax Number: No data recorded  Prescriber Name: No data recorded  Prescriber Address (if known): No data recorded What Is the Reason for Your  Visit/Call Today? No data recorded How Long Has This Been Causing You Problems? > than 6 months  Have You Recently Been in Any Inpatient Treatment (Hospital/Detox/Crisis Center/28-Day Program)? Yes   Name/Location of Program/Hospital:No data recorded  How Long Were You There? No data recorded  When Were You Discharged? No data recorded Have You Ever Received Services From [facility] Before? Yes   Who Do You See at [facility]? No data recorded Have You Recently Had Any Thoughts About Hurting Yourself? Yes   Are You Planning to Commit Suicide/Harm Yourself At This time?  Yes  Have you Recently Had Thoughts About Hurting Someone [name]? No   Explanation: No data recorded Have You Used Any Alcohol or Drugs in the Past 24 Hours? Yes   How Long Ago Did You Use Drugs or Alcohol?  No data recorded  What Did You Use and How Much? No data recorded What Do You Feel Would Help You the Most Today? Assessment Only  Do You Currently Have a Therapist/Psychiatrist? No   Name of Therapist/Psychiatrist: No data recorded  Have You Been Recently Discharged From Any Office Practice or Programs? No   Explanation of Discharge From Practice/Program:  No data recorded    CCA Screening Triage Referral Assessment Type of Contact: Face-to-Face   Is this Initial or Reassessment? Iniital  Date Telepsych consult ordered in [facility]:  12/12/19  Time Telepsych consult ordered in [facility]:  No data recorded Patient Reported Information Reviewed? Yes   Patient Left Without  Being Seen? No data recorded  Reason for Not Completing Assessment: Pt's nurse is on break & her documentation noted pt has not been responsive; will attempt contact again in 10 - 15 minutes  Collateral Involvement: NONE Does Patient Have a Page? No data recorded  Name and Contact of Legal Guardian:  Self  If Minor and Not Living with Parent(s), Who has Custody? Self  Is CPS involved or ever been involved? Never  Is  APS involved or ever been involved? Never  Patient Determined To Be At Risk for Harm To Self or Others Based on Review of Patient Reported Information or Presenting Complaint? No   Method: No data recorded  Availability of Means: No data recorded  Intent: No data recorded  Notification Required: No data recorded  Additional Information for Danger to Others Potential:  No data recorded  Additional Comments for Danger to Others Potential:  No data recorded  Are There Guns or Other Weapons in Your Home?  No data recorded   Types of Guns/Weapons: No data recorded   Are These Weapons Safely Secured?                              No data recorded   Who Could Verify You Are Able To Have These Secured:    No data recorded Do You Have any Outstanding Charges, Pending Court Dates, Parole/Probation? No data recorded Contacted To Inform of Risk of Harm To Self or Others: No data recorded Location of Assessment: [facility] Services  Does Patient Present under Involuntary Commitment? No   IVC Papers Initial File Date: No data recorded  [place] of Residence: [place]  Patient Currently Receiving the Following Services: Not Receiving Services   Determination of Need: Overnight Observation  Options For Referral: Therapy, Med Managment  Donato Heinz, LCSWA

## 2019-12-12 NOTE — ED Notes (Signed)
Pt intoxicated, presents with suicidal ideations, no plan noted and alcohol intoxication.  Pt's gait unsteady.  Pt calm, cooperative and appreciative of care.  Skin search completed, no seizure activity noted.  Monitoring for safety.

## 2019-12-12 NOTE — ED Notes (Signed)
Patient belongings in [place] [address]

## 2019-12-13 LAB — COMPREHENSIVE METABOLIC PANEL
ALT: 26 U/L (ref 0–44)
AST: 75 U/L — ABNORMAL HIGH (ref 15–41)
Albumin: 4.4 g/dL (ref 3.5–5.0)
Alkaline Phosphatase: 120 U/L (ref 38–126)
Anion gap: 13 (ref 5–15)
BUN: <5 mg/dL — ABNORMAL LOW (ref 6–20)
CO2: 24 mmol/L (ref 22–32)
Calcium: 9.2 mg/dL (ref 8.9–10.3)
Chloride: 100 mmol/L (ref 98–111)
Creatinine, Ser: 0.8 mg/dL (ref 0.61–1.24)
GFR calc Af Amer: >60 mL/min (ref >60)
GFR calc non Af Amer: >60 mL/min (ref >60)
Glucose, Bld: 92 mg/dL (ref 70–99)
Potassium: 3.6 mmol/L (ref 3.5–5.1)
Sodium: 137 mmol/L (ref 135–145)
Total Bilirubin: 0.7 mg/dL (ref 0.3–1.2)
Total Protein: 8.8 g/dL — ABNORMAL HIGH (ref 6.5–8.1)

## 2019-12-13 LAB — CBC WITH DIFFERENTIAL/PLATELET
Abs Immature Granulocytes: 0.01 10*3/uL (ref 0.00–0.07)
Basophils Absolute: 0.1 10*3/uL (ref 0.0–0.1)
Basophils Relative: 1 %
Eosinophils Absolute: 0.1 10*3/uL (ref 0.0–0.5)
Eosinophils Relative: 3 %
HCT: 33.5 % — ABNORMAL LOW (ref 39.0–52.0)
Hemoglobin: 10.2 g/dL — ABNORMAL LOW (ref 13.0–17.0)
Immature Granulocytes: 0 %
Lymphocytes Relative: 49 %
Lymphs Abs: 2.4 10*3/uL (ref 0.7–4.0)
MCH: 23.1 pg — ABNORMAL LOW (ref 26.0–34.0)
MCHC: 30.4 g/dL (ref 30.0–36.0)
MCV: 75.8 fL — ABNORMAL LOW (ref 80.0–100.0)
Monocytes Absolute: 0.6 10*3/uL (ref 0.1–1.0)
Monocytes Relative: 13 %
Neutro Abs: 1.7 10*3/uL (ref 1.7–7.7)
Neutrophils Relative %: 34 %
Platelets: 76 10*3/uL — ABNORMAL LOW (ref 150–400)
RBC: 4.42 MIL/uL (ref 4.22–5.81)
RDW: 21 % — ABNORMAL HIGH (ref 11.5–15.5)
WBC: 4.9 10*3/uL (ref 4.0–10.5)
nRBC: 0 % (ref 0.0–0.2)

## 2019-12-13 LAB — SARS CORONAVIRUS 2 BY RT PCR (HOSPITAL ORDER, PERFORMED IN ~~LOC~~ HOSPITAL LAB): SARS Coronavirus 2: NEGATIVE

## 2019-12-13 LAB — ETHANOL: Alcohol, Ethyl (B): 380 mg/dL (ref <10)

## 2019-12-13 NOTE — ED Provider Notes (Signed)
FBC/OBS ASAP Discharge Summary  Date and Time: 12/13/2019 11:20 AM  Name: Barry Moore  MRN:  [id number]   Discharge Diagnoses:  Final diagnoses:  Alcohol-induced mood disorder ([place])  Alcohol abuse with intoxication ([place])    Subjective: Patient reports today that he is doing better.  Patient denies any suicidal homicidal ideations and denies any hallucinations.  Patient reports that he does need help with his substance abuse.  He reports drinking excessive amounts of alcohol on a daily basis.  He reports that he was in detox approximately 1 to 2 months ago and was [facility] couple of weeks ago.  He states that last place he was staying at was but that is in the house.  Patient reports that he has been to multiple rehab facilities and states that the reason he continues to drink is being homeless.  Patient is requesting an additional detox treatment and then potentially going to residential treatment afterwards.  Stay Summary: Patient is a 54 year old male with a significant history of chronic alcohol use presented to the [facility] voluntarily with law enforcement due to his intoxication and reporting suicidal thoughts with a plan.  Patient presented intoxicated and his BAL was 380 and also positive for benzodiazepines.  Patient has had 16 ED visits in the last 6 months.  Patient reports that he has been to numerous residential substance abuse treatments as well as detox.  Patient is requesting additional detox.  [facility] [place] [facility] and they stated they had a male bed available.  [facility] will [facility] [place] [facility] for detox bed.  Patient has continued to deny any suicidal or homicidal ideations and denied any hallucinations.  Patient was also provided with resources to return to the [facility] for outpatient services for follow-up at [facility] from 8 AM to 10 AM.  Total Time spent with patient: 20  minutes  Past Psychiatric History: alcohol dependence, MDD Past Medical History:  Past Medical History:  Diagnosis Date  . Alcoholic hepatitis   . Depression   . ETOH abuse   . Gastric bleed 08/2018  . Hypertension   . Pancreatitis   . Seizures ([place])   . Suicidal behavior     Past Surgical History:  Procedure Laterality Date  . BIOPSY  08/05/2018   Procedure: BIOPSY;  Surgeon: Rush Landmark Telford Nab., MD;  Location: [place];  Service: Gastroenterology;;  . BIOPSY  08/07/2018   Procedure: BIOPSY;  Surgeon: Thornton Park, MD;  Location: [facility] ENDOSCOPY;  Service: Gastroenterology;;  . BIOPSY  01/02/2019   Procedure: BIOPSY;  Surgeon: Gatha Mayer, MD;  Location: [facility] ENDOSCOPY;  Service: Endoscopy;;  . COLONOSCOPY WITH PROPOFOL N/A 08/07/2018   Procedure: COLONOSCOPY WITH PROPOFOL;  Surgeon: Thornton Park, MD;  Location: [place];  Service: Gastroenterology;  Laterality: N/A;  . ESOPHAGOGASTRODUODENOSCOPY N/A 01/02/2019   Procedure: ESOPHAGOGASTRODUODENOSCOPY (EGD);  Surgeon: Gatha Mayer, MD;  Location[facility];  Service: Endoscopy;  Laterality: N/A;  . ESOPHAGOGASTRODUODENOSCOPY N/A 07/03/2019   Procedure: ESOPHAGOGASTRODUODENOSCOPY (EGD);  Surgeon: Wonda Horner, MD;  Location: [facility];  Service: Endoscopy;  Laterality: N/A;  . ESOPHAGOGASTRODUODENOSCOPY (EGD) WITH PROPOFOL N/A 11/02/2016   Procedure: ESOPHAGOGASTRODUODENOSCOPY (EGD) WITH PROPOFOL;  Surgeon: Carol Ada, MD;  Location: [facility] ENDOSCOPY;  Service: Endoscopy;  Laterality: N/A;  . ESOPHAGOGASTRODUODENOSCOPY (EGD) WITH PROPOFOL N/A 08/05/2018   Procedure: ESOPHAGOGASTRODUODENOSCOPY (EGD) WITH PROPOFOL;  Surgeon: Irving Copas., MD;  Location: [facility] ENDOSCOPY;  Service: Gastroenterology;  Laterality: N/A;  . ESOPHAGOGASTRODUODENOSCOPY (EGD) WITH PROPOFOL N/A 07/14/2019   Procedure: ESOPHAGOGASTRODUODENOSCOPY (EGD) WITH PROPOFOL;  Surgeon: Otis Brace, MD;  Location: [facility] ENDOSCOPY;   Service: Gastroenterology;  Laterality: N/A;  . HERNIA REPAIR    . LEG SURGERY    . POLYPECTOMY  08/07/2018   Procedure: POLYPECTOMY;  Surgeon: Thornton Park, MD;  Location: [facility] ENDOSCOPY;  Service: Gastroenterology;;   Family History:  Family History  Problem Relation Age of Onset  . Diabetes Mother   . Alcoholism Mother   . Emphysema Father   . Lung cancer Father   . Alcoholism Father    Family Psychiatric History: See above Social History:  Social History   Substance and Sexual Activity  Alcohol Use Yes   Comment: 5+ BEERS DAILY, Patient does not know how much he drank tonight     Social History   Substance and Sexual Activity  Drug Use Not Currently  . Frequency: 3.0 times per week  . Types: Cocaine    Social History   Socioeconomic History  . Marital status: Divorced    Spouse name: Not on file  . Number of children: Not on file  . Years of education: Not on file  . Highest education level: Not on file  Occupational History  . Not on file  Tobacco Use  . Smoking status: Current Every Day Smoker    Packs/day: 1.00    Types: Cigarettes  . Smokeless tobacco: Never Used  Vaping Use  . Vaping Use: Never used  Substance and Sexual Activity  . Alcohol use: Yes    Comment: 5+ BEERS DAILY, Patient does not know how much he drank tonight  . Drug use: Not Currently    Frequency: 3.0 times per week    Types: Cocaine  . Sexual activity: Not Currently  Other Topics Concern  . Not on file  Social History Narrative  . Not on file   Social Determinants of Health   Financial Resource Strain:   . Difficulty of Paying Living Expenses: Not on file  Food Insecurity:   . Worried About Charity fundraiser in the Last Year: Not on file  . Ran Out of Food in the Last Year: Not on file  Transportation Needs:   . Lack of Transportation (Medical): Not on file  . Lack of Transportation (Non-Medical): Not on file  Physical Activity:   . Days of Exercise per Week: Not on  file  . Minutes of Exercise per Session: Not on file  Stress:   . Feeling of Stress : Not on file  Social Connections:   . Frequency of Communication with Friends and Family: Not on file  . Frequency of Social Gatherings with Friends and Family: Not on file  . Attends Religious Services: Not on file  . Active Member of Clubs or Organizations: Not on file  . Attends Archivist Meetings: Not on file  . Marital Status: Not on file   SDOH:  SDOH Screenings   Alcohol Screen:   . Last Alcohol Screening Score (AUDIT): Not on file  Depression (PHQ2-9): Medium Risk  . PHQ-2 Score: 11  Financial Resource Strain:   . Difficulty of Paying Living Expenses: Not on file  Food Insecurity:   . Worried About Charity fundraiser in the Last Year: Not on file  . Ran Out of Food in the Last Year: Not on file  Housing:   . Last Housing Risk  Score: Not on file  Physical Activity:   . Days of Exercise per Week: Not on file  . Minutes of Exercise per Session: Not on file  Social Connections:   . Frequency of Communication with Friends and Family: Not on file  . Frequency of Social Gatherings with Friends and Family: Not on file  . Attends Religious Services: Not on file  . Active Member of Clubs or Organizations: Not on file  . Attends Archivist Meetings: Not on file  . Marital Status: Not on file  Stress:   . Feeling of Stress : Not on file  Tobacco Use: High Risk  . Smoking Tobacco Use: Current Every Day Smoker  . Smokeless Tobacco Use: Never Used  Transportation Needs:   . Film/video editor (Medical): Not on file  . Lack of Transportation (Non-Medical): Not on file    Has this patient used any form of tobacco in the last 30 days? (Cigarettes, Smokeless Tobacco, Cigars, and/or Pipes) A prescription for an FDA-approved tobacco cessation medication was offered at discharge and the patient refused  Current Medications:  Current Facility-Administered Medications   Medication Dose Route Frequency Provider Last Rate Last Admin  . acetaminophen (TYLENOL) tablet 650 mg  650 mg Oral Q6H PRN Lindon Romp A, NP      . alum & mag hydroxide-simeth (MAALOX/MYLANTA) 200-200-20 MG/5ML suspension 30 mL  30 mL Oral Q4H PRN Lindon Romp A, NP      . amLODipine (NORVASC) tablet 5 mg  5 mg Oral Daily Lindon Romp A, NP   5 mg at 12/13/19 0950  . folic acid (FOLVITE) tablet 1 mg  1 mg Oral Daily Lindon Romp A, NP   1 mg at 12/13/19 0950  . hydrOXYzine (ATARAX/VISTARIL) tablet 25 mg  25 mg Oral Q6H PRN Lindon Romp A, NP      . loperamide (IMODIUM) capsule 2-4 mg  2-4 mg Oral PRN Lindon Romp A, NP      . LORazepam (ATIVAN) tablet 1 mg  1 mg Oral Q6H PRN Lindon Romp A, NP   1 mg at 12/13/19 0651  . LORazepam (ATIVAN) tablet 1 mg  1 mg Oral QID Lindon Romp A, NP   1 mg at 12/13/19 0951   Followed by  . [START ON 12/14/2019] LORazepam (ATIVAN) tablet 1 mg  1 mg Oral TID Rozetta Nunnery, NP       Followed by  . [START ON 12/15/2019] LORazepam (ATIVAN) tablet 1 mg  1 mg Oral BID Rozetta Nunnery, NP       Followed by  . [START ON 12/17/2019] LORazepam (ATIVAN) tablet 1 mg  1 mg Oral Daily Lindon Romp A, NP      . magnesium hydroxide (MILK OF MAGNESIA) suspension 30 mL  30 mL Oral Daily PRN Lindon Romp A, NP      . multivitamin with minerals tablet 1 tablet  1 tablet Oral Daily Lindon Romp A, NP   1 tablet at 12/13/19 0951  . ondansetron (ZOFRAN-ODT) disintegrating tablet 4 mg  4 mg Oral Q6H PRN Lindon Romp A, NP      . pantoprazole (PROTONIX) EC tablet 40 mg  40 mg Oral BID Lindon Romp A, NP   40 mg at 12/13/19 0950  . thiamine tablet 100 mg  100 mg Oral Daily Lindon Romp A, NP   100 mg at 12/13/19 8841   Current Outpatient Medications  Medication Sig Dispense Refill  . amLODipine (NORVASC) 5 MG tablet  Take 1 tablet (5 mg total) by mouth daily. (Patient not taking: Reported on 12/13/2019) 30 tablet 0  . folic acid (FOLVITE) 1 MG tablet Take 1 tablet (1 mg total) by mouth  daily. (Patient not taking: Reported on 12/13/2019)    . Multiple Vitamin (MULTIVITAMIN WITH MINERALS) TABS tablet Take 1 tablet by mouth daily. (Patient not taking: Reported on 12/13/2019)    . nicotine (NICODERM CQ - DOSED IN MG/24 HOURS) 21 mg/24hr patch Place 1 patch (21 mg total) onto the skin daily. (Patient not taking: Reported on 12/13/2019) 28 patch 0  . pantoprazole (PROTONIX) 40 MG tablet Take 1 tablet (40 mg total) by mouth 2 (two) times daily. (Patient not taking: Reported on 12/13/2019) 60 tablet 0  . thiamine 100 MG tablet Take 1 tablet (100 mg total) by mouth daily. (Patient not taking: Reported on 12/13/2019)      PTA Medications: (Not in a hospital admission)   Musculoskeletal  Strength & Muscle Tone: within normal limits Gait & Station: normal Patient leans: N/A  Psychiatric Specialty Exam  Presentation  General Appearance: Disheveled  Eye Contact:Fair  Speech:Clear and Coherent;Normal Rate  Speech Volume:Normal  Handedness:Right   Mood and Affect  Mood:Anxious  Affect:Flat   Thought Process  Thought Processes:Coherent  Descriptions of Associations:Intact  Orientation:Full (Time, Place and Person)  Thought Content:WDL  Hallucinations:Hallucinations: None  Ideas of Reference:None  Suicidal Thoughts:Suicidal Thoughts: No SI Active Intent and/or Plan: With Intent;With Plan;With Means to Carry Out  Homicidal Thoughts:Homicidal Thoughts: No HI Passive Intent and/or Plan: Without Intent;Without Plan   Sensorium  Memory:Immediate Good;Recent Good;Remote Good  Judgment:Fair  Insight:Fair   Executive Functions  Concentration:Fair  Attention Span:Fair  Recall:Good  Fund of Knowledge:Good  Language:Good   Psychomotor Activity  Psychomotor Activity:Psychomotor Activity: Normal   Assets  Assets:Communication Skills;Desire for Improvement;Housing;Social Support;Transportation   Sleep  Sleep:Sleep: Fair   Physical Exam  Physical  Exam Vitals and nursing note reviewed.  Constitutional:      Appearance: He is well-developed.  Cardiovascular:     Rate and Rhythm: Normal rate.  Pulmonary:     Effort: Pulmonary effort is normal.  Musculoskeletal:        General: Normal range of motion.  Skin:    General: Skin is warm.  Neurological:     Mental Status: He is alert and oriented to person, place, and time.    Review of Systems  Constitutional: Negative.   HENT: Negative.   Eyes: Negative.   Respiratory: Negative.   Cardiovascular: Negative.   Gastrointestinal: Negative.   Genitourinary: Negative.   Musculoskeletal: Negative.   Skin: Negative.   Neurological: Negative.   Endo/Heme/Allergies: Negative.   Psychiatric/Behavioral: Positive for substance abuse.   Blood pressure 133/90, pulse (!) 106, temperature (!) 97.3 F (36.3 C), temperature source Tympanic, resp. rate 18, SpO2 98 %. There is no height or weight on file to calculate BMI.  Demographic Factors:  Male, Caucasian and Low socioeconomic status  Loss Factors: NA  Historical Factors: NA  Risk Reduction Factors:   Living with another person, especially a relative and Positive social support  Continued Clinical Symptoms:  Alcohol/Substance Abuse/Dependencies  Cognitive Features That Contribute To Risk:  None    Suicide Risk:  Minimal: No identifiable suicidal ideation.  Patients presenting with no risk factors but with morbid ruminations; may be classified as minimal risk based on the severity of the depressive symptoms  Plan Of Care/Follow-up recommendations:  Continue activity as tolerated. Continue diet as recommended by your  PCP. Ensure to keep all appointments with outpatient providers.  Disposition: Discharge to [facility] in [place] for detox bed assessment  Lewis Shock, FNP 12/13/2019, 11:20 AM

## 2019-12-13 NOTE — Discharge Instructions (Signed)
Take medications as prescribed Follow up with [facility] services

## 2019-12-13 NOTE — ED Notes (Signed)
LAB CALLED TO REPORT ALCOHOL LEVEL OF 380.  NP [place] NOTIFIED.

## 2019-12-13 NOTE — ED Notes (Signed)
Belongings returned to pt. Ambulated per self to back [place] escorted by this nurse to safe transport. Stable at time of d/c.

## 2019-12-13 NOTE — ED Notes (Signed)
meds taken without issue. No new issues noted. Will continue to monitor for safety

## 2019-12-13 NOTE — ED Notes (Signed)
Pt sleeping @ this time

## 2019-12-13 NOTE — ED Notes (Signed)
Safe transportation called

## 2019-12-13 NOTE — ED Notes (Signed)
Pt awake having vitals taken.  Will continue to monitor for safety

## 2019-12-13 NOTE — Progress Notes (Signed)
CSW spoke with Marita [place] [phone]), supervisor at [facility] in [place], [place]. She reports that there are two male detox beds available currently at their facility. There admissions process is "first come first serve", so pt will need to go to their facility and complete an assessment prior to be formally admitted. CSW informed [place] FNP.   Audree Camel, MSW, LCSW, [place] Clinical Social Worker II Disposition CSW [phone]

## 2019-12-27 ENCOUNTER — Encounter (HOSPITAL_COMMUNITY): Payer: Self-pay | Admitting: Emergency Medicine

## 2019-12-27 ENCOUNTER — Emergency Department (HOSPITAL_COMMUNITY)
Admission: EM | Admit: 2019-12-27 | Discharge: 2019-12-28 | Disposition: A | Discharge status: Other Acute Inpatient Hospital | Payer: Self-pay | Attending: Emergency Medicine | Admitting: Emergency Medicine

## 2019-12-27 DIAGNOSIS — F1014 Alcohol abuse with alcohol-induced mood disorder: Secondary | ICD-10-CM | POA: Insufficient documentation

## 2019-12-27 DIAGNOSIS — R45851 Suicidal ideations: Secondary | ICD-10-CM | POA: Insufficient documentation

## 2019-12-27 DIAGNOSIS — Y908 Blood alcohol level of 240 mg/100 ml or more: Secondary | ICD-10-CM | POA: Insufficient documentation

## 2019-12-27 DIAGNOSIS — F10920 Alcohol use, unspecified with intoxication, uncomplicated: Secondary | ICD-10-CM

## 2019-12-27 DIAGNOSIS — Z20822 Contact with and (suspected) exposure to covid-19: Secondary | ICD-10-CM | POA: Insufficient documentation

## 2019-12-27 DIAGNOSIS — F10129 Alcohol abuse with intoxication, unspecified: Secondary | ICD-10-CM | POA: Insufficient documentation

## 2019-12-27 LAB — RAPID URINE DRUG SCREEN, HOSP PERFORMED
Amphetamines: NOT DETECTED
Barbiturates: NOT DETECTED
Benzodiazepines: NOT DETECTED
Cocaine: NOT DETECTED
Opiates: NOT DETECTED
Tetrahydrocannabinol: NOT DETECTED

## 2019-12-27 LAB — BASIC METABOLIC PANEL
Anion gap: 12 (ref 5–15)
BUN: 5 mg/dL — ABNORMAL LOW (ref 6–20)
CO2: 28 mmol/L (ref 22–32)
Calcium: 9.2 mg/dL (ref 8.9–10.3)
Chloride: 104 mmol/L (ref 98–111)
Creatinine, Ser: 0.76 mg/dL (ref 0.61–1.24)
GFR calc non Af Amer: >60 mL/min (ref >60)
Glucose, Bld: 118 mg/dL — ABNORMAL HIGH (ref 70–99)
Potassium: 3.7 mmol/L (ref 3.5–5.1)
Sodium: 144 mmol/L (ref 135–145)

## 2019-12-27 LAB — CBC WITH DIFFERENTIAL/PLATELET
Abs Immature Granulocytes: 0.01 10*3/uL (ref 0.00–0.07)
Basophils Absolute: 0.1 10*3/uL (ref 0.0–0.1)
Basophils Relative: 2 %
Eosinophils Absolute: 0.2 10*3/uL (ref 0.0–0.5)
Eosinophils Relative: 4 %
HCT: 32.3 % — ABNORMAL LOW (ref 39.0–52.0)
Hemoglobin: 9.7 g/dL — ABNORMAL LOW (ref 13.0–17.0)
Immature Granulocytes: 0 %
Lymphocytes Relative: 46 %
Lymphs Abs: 2.3 10*3/uL (ref 0.7–4.0)
MCH: 22.8 pg — ABNORMAL LOW (ref 26.0–34.0)
MCHC: 30 g/dL (ref 30.0–36.0)
MCV: 75.8 fL — ABNORMAL LOW (ref 80.0–100.0)
Monocytes Absolute: 0.7 10*3/uL (ref 0.1–1.0)
Monocytes Relative: 14 %
Neutro Abs: 1.7 10*3/uL (ref 1.7–7.7)
Neutrophils Relative %: 34 %
Platelets: 296 10*3/uL (ref 150–400)
RBC: 4.26 MIL/uL (ref 4.22–5.81)
RDW: 19.7 % — ABNORMAL HIGH (ref 11.5–15.5)
WBC: 4.9 10*3/uL (ref 4.0–10.5)
nRBC: 0 % (ref 0.0–0.2)

## 2019-12-27 LAB — ETHANOL: Alcohol, Ethyl (B): 366 mg/dL (ref <10)

## 2019-12-27 MED ORDER — THIAMINE HCL 100 MG PO TABS: 100.0000 mg | ORAL_TABLET | Freq: Every day | ORAL | Status: DC

## 2019-12-27 MED ORDER — ADULT MULTIVITAMIN W/MINERALS CH: 1.0000 | ORAL_TABLET | Freq: Every day | ORAL | Status: DC

## 2019-12-27 MED ORDER — LORAZEPAM 1 MG PO TABS
0.0000 mg | ORAL_TABLET | Freq: Four times a day (QID) | ORAL | Status: DC
  Filled 2019-12-27: qty 1

## 2019-12-27 MED ORDER — LORAZEPAM 2 MG/ML IJ SOLN: 0.0000 mg | Freq: Four times a day (QID) | INTRAMUSCULAR | Status: DC

## 2019-12-27 MED ORDER — NICOTINE 21 MG/24HR TD PT24: 21.0000 mg | MEDICATED_PATCH | Freq: Every day | TRANSDERMAL | Status: DC

## 2019-12-27 MED ORDER — LORAZEPAM 2 MG/ML IJ SOLN: 0.0000 mg | Freq: Two times a day (BID) | INTRAMUSCULAR | Status: DC

## 2019-12-27 MED ORDER — PANTOPRAZOLE SODIUM 40 MG PO TBEC
40.0000 mg | DELAYED_RELEASE_TABLET | Freq: Two times a day (BID) | ORAL | Status: DC
  Administered 2019-12-27: 40 mg via ORAL
  Filled 2019-12-27: qty 1

## 2019-12-27 MED ORDER — AMLODIPINE BESYLATE 5 MG PO TABS: 5.0000 mg | ORAL_TABLET | Freq: Every day | ORAL | Status: DC

## 2019-12-27 MED ORDER — THIAMINE HCL 100 MG/ML IJ SOLN: 100.0000 mg | Freq: Every day | INTRAMUSCULAR | Status: DC

## 2019-12-27 MED ORDER — LORAZEPAM 1 MG PO TABS
0.0000 mg | ORAL_TABLET | Freq: Two times a day (BID) | ORAL | Status: DC
  Administered 2019-12-28: 1 mg via ORAL

## 2019-12-27 MED ORDER — FOLIC ACID 1 MG PO TABS: 1.0000 mg | ORAL_TABLET | Freq: Every day | ORAL | Status: DC

## 2019-12-27 NOTE — ED Triage Notes (Addendum)
Pt BIB EMS from street. Pt brother called EMS stating the pt had a seizure. ETOH on board. Tremors at baseline. Pt states he hasn't eaten in 2 days.

## 2019-12-27 NOTE — ED Notes (Addendum)
Date and time results received: 12/27/19 2006(use smartphrase ".now" to insert current time)  Test: ETOH Critical Value: 366  Name of Provider Notified: [place] md Orders Received? Or Actions Taken?:no new orders

## 2019-12-27 NOTE — ED Provider Notes (Addendum)
[name] DEPT Provider Note   CSN: [id number] Arrival date & time: 12/27/19  1745     History Chief Complaint  Patient presents with  . Alcohol Intoxication    Barry Moore is a 54 y.o. male.  Patient is a 54 year old male with a history of alcohol abuse, homelessness, hypertension, pancreatitis, seizure disorder was being brought in today by EMS because that his brother called stating the patient had a seizure.  Patient reports that he has not eaten in 2 days and he has been very depressed because a family member died earlier this week.  He reports that he is suicidal and he is trying to drink himself to death but it is not working very well.  He does also admit to using cocaine 2 days ago.  He reports he feels depressed and sad and just wishes he was not living anymore.  He denies any chest pain, shortness of breath, abdominal pain.  The history is provided by the patient and the EMS personnel.  Alcohol Intoxication       Past Medical History:  Diagnosis Date  . Alcoholic hepatitis   . Depression   . ETOH abuse   . Gastric bleed 08/2018  . Hypertension   . Pancreatitis   . Seizures ([place])   . Suicidal behavior     Patient Active Problem List   Diagnosis Date Noted  . Nicotine dependence, cigarettes, uncomplicated 40/98/1191  . Alcohol abuse with intoxication ([place]) 07/19/2019  . Orthostatic syncope 07/19/2019  . Upper GI bleeding 07/19/2019  . Acute upper GI bleeding 07/02/2019  . Duodenitis   . Acute upper GI bleed 01/01/2019  . Hypokalemia 01/01/2019  . Alcohol dependence ([place]) 01/01/2019  . Alcoholic liver disease ([place]) 01/01/2019  . Acute pancreatitis 12/13/2018  . Free intraperitoneal air 12/13/2018  . Homicidal ideation   . Gastrointestinal hemorrhage with melena 08/31/2018  . Colon ulcer   . Polyp of ascending colon   . Acute GI bleeding 08/05/2018  . Transaminitis 08/05/2018  . Abdominal pain 08/05/2018  . Nausea and  vomiting 08/05/2018  . Substance induced mood disorder ([place]) 07/16/2017  . Severe recurrent major depression without psychotic features ([place]) 05/15/2017  . Microcytic anemia 03/18/2017  . Acute blood loss anemia 02/21/2017  . Upper GI bleed 02/08/2017  . Alcohol withdrawal ([place]) 02/08/2017  . Cocaine abuse ([place]) 11/18/2016  . GI bleed 11/01/2016  . Alcohol abuse   . Major depressive disorder, recurrent severe without psychotic features ([place]) 07/06/2016  . Alcohol-induced mood disorder ([place]) 06/15/2016  . Seizure ([place]) 05/26/2016  . Tobacco abuse 05/26/2016  . Homeless 05/26/2016  . Essential hypertension 05/26/2016  . Alcohol dependence with alcohol-induced mood disorder ([place]) 02/14/2016  . Severe alcohol withdrawal without perceptual disturbances without complication ([place]) 47/82/9562  . Alcoholic hepatitis without ascites 02/14/2016  . Thrombocytopenia ([place]) 02/14/2016    Past Surgical History:  Procedure Laterality Date  . BIOPSY  08/05/2018   Procedure: BIOPSY;  Surgeon: Rush Landmark Telford Nab., MD;  Location: [facility];  Service: Gastroenterology;;  . BIOPSY  08/07/2018   Procedure: BIOPSY;  Surgeon: Thornton Park, MD;  Location: [facility] ENDOSCOPY;  Service: Gastroenterology;;  . BIOPSY  01/02/2019   Procedure: BIOPSY;  Surgeon: Gatha Mayer, MD;  Location: [facility] ENDOSCOPY;  Service: Endoscopy;;  . COLONOSCOPY WITH PROPOFOL N/A 08/07/2018   Procedure: COLONOSCOPY WITH PROPOFOL;  Surgeon: Thornton Park, MD;  Location: [facility];  Service: Gastroenterology;  Laterality: N/A;  . ESOPHAGOGASTRODUODENOSCOPY N/A 01/02/2019   Procedure:  ESOPHAGOGASTRODUODENOSCOPY (EGD);  Surgeon: Gatha Mayer, MD;  Location: [facility] ENDOSCOPY;  Service: Endoscopy;  Laterality: N/A;  . ESOPHAGOGASTRODUODENOSCOPY N/A 07/03/2019   Procedure: ESOPHAGOGASTRODUODENOSCOPY (EGD);  Surgeon: Wonda Horner, MD;  Location: [facility] ENDOSCOPY;  Service: Endoscopy;  Laterality: N/A;  . ESOPHAGOGASTRODUODENOSCOPY  (EGD) WITH PROPOFOL N/A 11/02/2016   Procedure: ESOPHAGOGASTRODUODENOSCOPY (EGD) WITH PROPOFOL;  Surgeon: Carol Ada, MD;  Location: [facility] ENDOSCOPY;  Service: Endoscopy;  Laterality: N/A;  . ESOPHAGOGASTRODUODENOSCOPY (EGD) WITH PROPOFOL N/A 08/05/2018   Procedure: ESOPHAGOGASTRODUODENOSCOPY (EGD) WITH PROPOFOL;  Surgeon: Rush Landmark Telford Nab., MD;  Location: [facility];  Service: Gastroenterology;  Laterality: N/A;  . ESOPHAGOGASTRODUODENOSCOPY (EGD) WITH PROPOFOL N/A 07/14/2019   Procedure: ESOPHAGOGASTRODUODENOSCOPY (EGD) WITH PROPOFOL;  Surgeon: Otis Brace, MD;  Location: [facility] ENDOSCOPY;  Service: Gastroenterology;  Laterality: N/A;  . HERNIA REPAIR    . LEG SURGERY    . POLYPECTOMY  08/07/2018   Procedure: POLYPECTOMY;  Surgeon: Thornton Park, MD;  Location: [facility] ENDOSCOPY;  Service: Gastroenterology;;       Family History  Problem Relation Age of Onset  . Diabetes Mother   . Alcoholism Mother   . Emphysema Father   . Lung cancer Father   . Alcoholism Father     Social History   Tobacco Use  . Smoking status: Current Every Day Smoker    Packs/day: 1.00    Types: Cigarettes  . Smokeless tobacco: Never Used  Vaping Use  . Vaping Use: Never used  Substance Use Topics  . Alcohol use: Yes    Comment: 5+ BEERS DAILY, Patient does not know how much he drank tonight  . Drug use: Not Currently    Frequency: 3.0 times per week    Types: Cocaine    Home Medications Prior to Admission medications   Medication Sig Start Date End Date Taking? Authorizing Provider  amLODipine (NORVASC) 5 MG tablet Take 1 tablet (5 mg total) by mouth daily. Patient not taking: Reported on 12/13/2019 07/20/19 08/19/19  Mariel Aloe, MD  folic acid (FOLVITE) 1 MG tablet Take 1 tablet (1 mg total) by mouth daily. Patient not taking: Reported on 12/13/2019 07/16/19   Georgette Shell, MD  Multiple Vitamin (MULTIVITAMIN WITH MINERALS) TABS tablet Take 1 tablet by mouth daily. Patient not  taking: Reported on 12/13/2019 07/16/19   Georgette Shell, MD  nicotine (NICODERM CQ - DOSED IN MG/24 HOURS) 21 mg/24hr patch Place 1 patch (21 mg total) onto the skin daily. Patient not taking: Reported on 12/13/2019 07/21/19   Mariel Aloe, MD  pantoprazole (PROTONIX) 40 MG tablet Take 1 tablet (40 mg total) by mouth 2 (two) times daily. Patient not taking: Reported on 12/13/2019 07/20/19   Mariel Aloe, MD  thiamine 100 MG tablet Take 1 tablet (100 mg total) by mouth daily. Patient not taking: Reported on 12/13/2019 07/16/19   Georgette Shell, MD  ferrous sulfate 325 (65 FE) MG tablet Take 1 tablet (325 mg total) by mouth daily with breakfast. Patient not taking: Reported on 09/23/2018 08/09/18 10/09/18  Elodia Florence., MD    Allergies    Tomato, Aspirin, and Sulfa antibiotics  Review of Systems   Review of Systems  All other systems reviewed and are negative.   Physical Exam Updated Vital Signs BP (!) 155/122 (BP Location: Right Arm)   Pulse 73   Temp 98.4 F (36.9 C) (Oral)   Resp 18   Ht 5\' 8"  (1.727 m)   Wt 75 kg   SpO2  100%   BMI 25.14 kg/m   Physical Exam Vitals and nursing note reviewed.  Constitutional:      General: He is not in acute distress.    Appearance: Normal appearance. He is well-developed and normal weight.     Comments: dissheveled and appears intoxicated  HENT:     Head: Normocephalic and atraumatic.     Mouth/Throat:     Mouth: Mucous membranes are moist.  Eyes:     Conjunctiva/sclera: Conjunctivae normal.     Pupils: Pupils are equal, round, and reactive to light.  Cardiovascular:     Rate and Rhythm: Normal rate and regular rhythm.     Heart sounds: No murmur heard.   Pulmonary:     Effort: Pulmonary effort is normal. No respiratory distress.     Breath sounds: Normal breath sounds. No wheezing or rales.  Abdominal:     General: There is no distension.     Palpations: Abdomen is soft.     Tenderness: There is no abdominal  tenderness. There is no guarding or rebound.  Musculoskeletal:        General: No tenderness. Normal range of motion.     Cervical back: Normal range of motion and neck supple.     Right lower leg: No edema.     Left lower leg: No edema.  Skin:    General: Skin is warm and dry.     Findings: No erythema or rash.  Neurological:     Mental Status: He is alert and oriented to person, place, and time.     Sensory: No sensory deficit.     Motor: No weakness.  Psychiatric:        Mood and Affect: Mood is depressed.        Thought Content: Thought content includes suicidal ideation. Thought content includes suicidal plan.     Comments: intoxicated     ED Results / Procedures / Treatments   Labs (all labs ordered are listed, but only abnormal results are displayed) Labs Reviewed  CBC WITH DIFFERENTIAL/PLATELET - Abnormal; Notable for the following components:      Result Value   Hemoglobin 9.7 (*)    HCT 32.3 (*)    MCV 75.8 (*)    MCH 22.8 (*)    RDW 19.7 (*)    All other components within normal limits  BASIC METABOLIC PANEL - Abnormal; Notable for the following components:   Glucose, Bld 118 (*)    BUN 5 (*)    All other components within normal limits  ETHANOL - Abnormal; Notable for the following components:   Alcohol, Ethyl (B) 366 (*)    All other components within normal limits  RESPIRATORY PANEL BY RT PCR (FLU A&B, COVID)  RAPID URINE DRUG SCREEN, HOSP PERFORMED    EKG None  Radiology No results found.  Procedures Procedures (including critical care time)  Medications Ordered in ED Medications - No data to display  ED Course  I have reviewed the triage vital signs and the nursing notes.  Pertinent labs & imaging results that were available during my care of the patient were reviewed by me and considered in my medical decision making (see chart for details).    MDM Rules/Calculators/A&P                          Patient presenting today intoxicated but  reporting he suicidal.  Reports that he wants to drink himself to death  and die because he recently had a family member who died.  Patient has been seen multiple times in the emergency room for intoxication but brother also reported that he had a seizure today.  Patient reports he is not taking any of his medications.  Patient was last seen by behavioral health on 12/13/2019 when he went to behavioral health urgent care and was evaluated there and observed.  He was discharged to follow-up with [facility] in [place] but he has not done that.  Labs are pending.  Patient is intoxicated and prior to talking to behavioral health will see how high his alcohol level is.  Patient takes no particular medication for seizures and it may be that he only has withdrawal seizures.  No signs of injury at this time.  8:48 PM Pt was able to eat and appears comfortable.  EtOH is 366 and will allow to sober up before speaking with psych.  Labs are otherwise at baseline.  MDM Number of Diagnoses or Management Options   Amount and/or Complexity of Data Reviewed Clinical lab tests: ordered and reviewed Independent visualization of images, tracings, or specimens: yes  Risk of Complications, Morbidity, and/or Mortality Presenting problems: moderate Diagnostic procedures: low Management options: low  Patient Progress Patient progress: stable     Final Clinical Impression(s) / ED Diagnoses Final diagnoses:  Alcoholic intoxication without complication ([place])  Suicidal ideation    Rx / DC Orders ED Discharge Orders    None       Blanchie Dessert, MD 12/27/19 2049    Blanchie Dessert, MD 12/27/19 2307

## 2019-12-28 ENCOUNTER — Encounter (HOSPITAL_COMMUNITY): Payer: Self-pay | Admitting: Emergency Medicine

## 2019-12-28 ENCOUNTER — Other Ambulatory Visit: Payer: Self-pay

## 2019-12-28 ENCOUNTER — Ambulatory Visit (HOSPITAL_COMMUNITY)
Admission: EM | Admit: 2019-12-28 | Discharge: 2019-12-28 | Disposition: A | Discharge status: Home / Self Care | Payer: No Payment, Other | Attending: Physician Assistant | Admitting: Physician Assistant

## 2019-12-28 DIAGNOSIS — Z811 Family history of alcohol abuse and dependence: Secondary | ICD-10-CM | POA: Insufficient documentation

## 2019-12-28 DIAGNOSIS — R451 Restlessness and agitation: Secondary | ICD-10-CM | POA: Insufficient documentation

## 2019-12-28 DIAGNOSIS — R450 Nervousness: Secondary | ICD-10-CM | POA: Insufficient documentation

## 2019-12-28 DIAGNOSIS — F419 Anxiety disorder, unspecified: Secondary | ICD-10-CM | POA: Insufficient documentation

## 2019-12-28 DIAGNOSIS — Z79899 Other long term (current) drug therapy: Secondary | ICD-10-CM | POA: Insufficient documentation

## 2019-12-28 DIAGNOSIS — R251 Tremor, unspecified: Secondary | ICD-10-CM | POA: Insufficient documentation

## 2019-12-28 DIAGNOSIS — F17210 Nicotine dependence, cigarettes, uncomplicated: Secondary | ICD-10-CM | POA: Insufficient documentation

## 2019-12-28 DIAGNOSIS — Z7901 Long term (current) use of anticoagulants: Secondary | ICD-10-CM | POA: Insufficient documentation

## 2019-12-28 DIAGNOSIS — F1094 Alcohol use, unspecified with alcohol-induced mood disorder: Secondary | ICD-10-CM | POA: Insufficient documentation

## 2019-12-28 DIAGNOSIS — R45851 Suicidal ideations: Secondary | ICD-10-CM | POA: Insufficient documentation

## 2019-12-28 DIAGNOSIS — Z818 Family history of other mental and behavioral disorders: Secondary | ICD-10-CM | POA: Insufficient documentation

## 2019-12-28 DIAGNOSIS — F333 Major depressive disorder, recurrent, severe with psychotic symptoms: Secondary | ICD-10-CM | POA: Insufficient documentation

## 2019-12-28 DIAGNOSIS — F19239 Other psychoactive substance dependence with withdrawal, unspecified: Secondary | ICD-10-CM | POA: Insufficient documentation

## 2019-12-28 DIAGNOSIS — R454 Irritability and anger: Secondary | ICD-10-CM | POA: Insufficient documentation

## 2019-12-28 LAB — RESPIRATORY PANEL BY RT PCR (FLU A&B, COVID)
Influenza A by PCR: NEGATIVE
Influenza B by PCR: NEGATIVE
SARS Coronavirus 2 by RT PCR: NEGATIVE

## 2019-12-28 MED ORDER — ADULT MULTIVITAMIN W/MINERALS CH
1.0000 | ORAL_TABLET | Freq: Every day | ORAL | Status: DC
  Administered 2019-12-28: 1 via ORAL
  Filled 2019-12-28: qty 1

## 2019-12-28 MED ORDER — LORAZEPAM 1 MG PO TABS: 1.0000 mg | ORAL_TABLET | Freq: Three times a day (TID) | ORAL | Status: DC

## 2019-12-28 MED ORDER — LORAZEPAM 1 MG PO TABS
1.0000 mg | ORAL_TABLET | Freq: Four times a day (QID) | ORAL | Status: DC
  Administered 2019-12-28: 1 mg via ORAL
  Filled 2019-12-28: qty 1

## 2019-12-28 MED ORDER — ONDANSETRON 4 MG PO TBDP: 4.0000 mg | ORAL_TABLET | Freq: Four times a day (QID) | ORAL | Status: DC | PRN

## 2019-12-28 MED ORDER — LORAZEPAM 1 MG PO TABS: 1.0000 mg | ORAL_TABLET | Freq: Four times a day (QID) | ORAL | Status: DC | PRN

## 2019-12-28 MED ORDER — LOPERAMIDE HCL 2 MG PO CAPS: 2.0000 mg | ORAL_CAPSULE | ORAL | Status: DC | PRN

## 2019-12-28 MED ORDER — LORAZEPAM 1 MG PO TABS: 1.0000 mg | ORAL_TABLET | Freq: Every day | ORAL | Status: DC

## 2019-12-28 MED ORDER — THIAMINE HCL 100 MG PO TABS: 100.0000 mg | ORAL_TABLET | Freq: Every day | ORAL | Status: DC

## 2019-12-28 MED ORDER — ACETAMINOPHEN 325 MG PO TABS: 650.0000 mg | ORAL_TABLET | Freq: Four times a day (QID) | ORAL | Status: DC | PRN

## 2019-12-28 MED ORDER — THIAMINE HCL 100 MG/ML IJ SOLN: 100.0000 mg | Freq: Once | INTRAMUSCULAR | Status: DC

## 2019-12-28 MED ORDER — LORAZEPAM 1 MG PO TABS: 1.0000 mg | ORAL_TABLET | Freq: Two times a day (BID) | ORAL | Status: DC

## 2019-12-28 MED ORDER — HYDROXYZINE HCL 25 MG PO TABS: 25.0000 mg | ORAL_TABLET | Freq: Three times a day (TID) | ORAL | Status: DC | PRN

## 2019-12-28 MED ORDER — HYDROXYZINE HCL 25 MG PO TABS: 25.0000 mg | ORAL_TABLET | Freq: Four times a day (QID) | ORAL | Status: DC | PRN

## 2019-12-28 MED ORDER — MAGNESIUM HYDROXIDE 400 MG/5ML PO SUSP: 30.0000 mL | Freq: Every day | ORAL | Status: DC | PRN

## 2019-12-28 MED ORDER — TRAZODONE HCL 50 MG PO TABS: 50.0000 mg | ORAL_TABLET | Freq: Every evening | ORAL | Status: DC | PRN

## 2019-12-28 MED ORDER — ALUM & MAG HYDROXIDE-SIMETH 200-200-20 MG/5ML PO SUSP: 30.0000 mL | ORAL | Status: DC | PRN

## 2019-12-28 NOTE — ED Notes (Signed)
Safe transport called for transportation

## 2019-12-28 NOTE — ED Notes (Signed)
TTS complete 

## 2019-12-28 NOTE — ED Notes (Signed)
Breakfast given: Coffee and nutrigrain bair

## 2019-12-28 NOTE — ED Notes (Signed)
Attempted to call report to [facility] RN not available at this time.

## 2019-12-28 NOTE — ED Notes (Signed)
Pt resting with eyes closed. Rise and fall of chest noted. Will continue to monitor for safety

## 2019-12-28 NOTE — ED Notes (Addendum)
[facility] transfer, pt presents with complaint of suicidal ideations, no plan noted,  Denies HI.  Hearing voicies, calling  His name.   Pt reports his brother passed away recently.  Pt states he drinks 7-8 40 oz beers daily,  Skin search completed.  Monitoring for safety.

## 2019-12-28 NOTE — ED Triage Notes (Signed)
Presents with suicidal ideations, no plan noted.  Hearing voices, calling his name.  Alcohol abuse noted also.

## 2019-12-28 NOTE — ED Provider Notes (Signed)
Behavioral Health Admission H&P [facility] & OBS)  Date: 12/28/19 Patient Name: Eddi Hymes MRN: [id number] Chief Complaint:  Chief Complaint  Patient presents with  . Suicidal  . Alcohol Problem      Diagnoses:  Final diagnoses:  Alcohol-induced mood disorder (HCC)    HPI:  Dilon Lank is a 54 year old male with a PmHx/PPsychHx significant for chronic alcohol abuse who presents to [facility] arriving from [facility] Emergency Department due to alcohol intoxication and suicidal thoughts. Patient states that the thoughts began midday yesterday. Patient reports that he planned on drinking himself to death. When asked to quantify the amount of alcohol he drank, the patient states that he was unsure. Per Comprehensive Clinical Assessment note (12/28/2019), patient reported drinking 42 oz of beer. Patient had a BAL of 366 after being worked up at [employer].  Patient currently endorses suicide ideation. He feels like a danger to himself and cannot contract for safety. He denies homicide ideation. He further denies auditory or visual hallucinations. Patient states that he feels anxious and nervous. Patient reports feelings of depression. Patient is currently taking no medication. Patient reports having an appetite as long as he can keep the food down. Patient reports poor sleep, averaging roughly 2 hours of sleep a night.  PHQ 2-9:    Office Visit from 02/01/2019 in [place]  Thoughts that you would be better off dead, or of hurting yourself in some way Several days  PHQ-9 Total Score 11        ED from 11/29/2019 in [place] DEPT ED from 11/24/2019 in [place] DEPT ED from 07/21/2019 in [place] Low Risk Low Risk Error: Q6 is Yes, you must answer 7       Total Time spent with patient: 20 minutes  Musculoskeletal  Strength & Muscle Tone:  within normal limits Gait & Station: normal Patient leans: N/A  Psychiatric Specialty Exam  Presentation General Appearance: Disheveled  Eye Contact:Fair  Speech:Clear and Coherent;Normal Rate  Speech Volume:Normal  Handedness:Right   Mood and Affect  Mood:Anxious;Depressed  Affect:Congruent   Thought Process  Thought Processes:Coherent  Descriptions of Associations:Intact  Orientation:Full (Time, Place and Person)  Thought Content:WDL  Hallucinations:Hallucinations: None  Ideas of Reference:None  Suicidal Thoughts:Suicidal Thoughts: Yes, Active SI Active Intent and/or Plan: With Intent;With Plan  Homicidal Thoughts:Homicidal Thoughts: No   Sensorium  Memory:Immediate Good;Recent Good;Remote Good  Judgment:Fair  Insight:Fair   Executive Functions  Concentration:Good  Attention Span:Good  Recall:Good  Fund of Knowledge:Good  Language:Good   Psychomotor Activity  Psychomotor Activity:Psychomotor Activity: Normal   Assets  Assets:Communication Skills;Desire for Improvement   Sleep  Sleep:Sleep: Poor   Physical Exam Constitutional:      Appearance: He is ill-appearing.  HENT:     Head: Normocephalic and atraumatic.     Nose: Nose normal.  Eyes:     Extraocular Movements: Extraocular movements intact.     Pupils: Pupils are equal, round, and reactive to light.  Cardiovascular:     Rate and Rhythm: Normal rate and regular rhythm.  Pulmonary:     Effort: Pulmonary effort is normal.     Breath sounds: Normal breath sounds.  Musculoskeletal:        General: Normal range of motion.     Cervical back: Normal range of motion and neck supple.  Skin:    General: Skin is warm and dry.  Neurological:  Mental Status: He is alert and oriented to person, place, and time.  Psychiatric:        Thought Content: Thought content normal.     Comments: Patient reports feeling anxious and nervous. Patient still endorses suicide ideation.     Review of Systems  Constitutional: Negative.        Patient reports not feeling good at all.  HENT: Negative.   Eyes: Negative.   Respiratory: Negative.   Cardiovascular: Negative.   Gastrointestinal: Negative.   Musculoskeletal: Negative.   Skin: Negative.   Neurological: Positive for tremors.  Psychiatric/Behavioral: Positive for depression, substance abuse (Alcohol abuse) and suicidal ideas. Negative for hallucinations. The patient is nervous/anxious.     Blood pressure (!) 151/85, pulse 92, temperature 98.2 F (36.8 C), resp. rate 18, SpO2 98 %. There is no height or weight on file to calculate BMI.  Past Psychiatric History: Alcohol abuse Depression  Is the patient at risk to self? Yes  Has the patient been a risk to self in the past 6 months? Yes .    Has the patient been a risk to self within the distant past? Yes   Is the patient a risk to others? No   Has the patient been a risk to others in the past 6 months? No   Has the patient been a risk to others within the distant past? No   Past Medical History:  Past Medical History:  Diagnosis Date  . Alcoholic hepatitis   . Depression   . ETOH abuse   . Gastric bleed 08/2018  . Hypertension   . Pancreatitis   . Seizures ([place])   . Suicidal behavior     Past Surgical History:  Procedure Laterality Date  . BIOPSY  08/05/2018   Procedure: BIOPSY;  Surgeon: Rush Landmark Telford Nab., MD;  Location: [place];  Service: Gastroenterology;;  . BIOPSY  08/07/2018   Procedure: BIOPSY;  Surgeon: Thornton Park, MD;  Location: [facility] ENDOSCOPY;  Service: Gastroenterology;;  . BIOPSY  01/02/2019   Procedure: BIOPSY;  Surgeon: Gatha Mayer, MD;  Location: [facility] ENDOSCOPY;  Service: Endoscopy;;  . COLONOSCOPY WITH PROPOFOL N/A 08/07/2018   Procedure: COLONOSCOPY WITH PROPOFOL;  Surgeon: Thornton Park, MD;  Location: [facility];  Service: Gastroenterology;  Laterality: N/A;  . ESOPHAGOGASTRODUODENOSCOPY N/A 01/02/2019    Procedure: ESOPHAGOGASTRODUODENOSCOPY (EGD);  Surgeon: Gatha Mayer, MD;  Location: [facility];  Service: Endoscopy;  Laterality: N/A;  . ESOPHAGOGASTRODUODENOSCOPY N/A 07/03/2019   Procedure: ESOPHAGOGASTRODUODENOSCOPY (EGD);  Surgeon: Wonda Horner, MD;  Location: [facility]OSCOPY;  Service: Endoscopy;  Laterality: N/A;  . ESOPHAGOGASTRODUODENOSCOPY (EGD) WITH PROPOFOL N/A 11/02/2016   Procedure: ESOPHAGOGASTRODUODENOSCOPY (EGD) WITH PROPOFOL;  Surgeon: Carol Ada, MD;  Location: [facility] ENDOSCOPY;  Service: Endoscopy;  Laterality: N/A;  . ESOPHAGOGASTRODUODENOSCOPY (EGD) WITH PROPOFOL N/A 08/05/2018   Procedure: ESOPHAGOGASTRODUODENOSCOPY (EGD) WITH PROPOFOL;  Surgeon: Rush Landmark Telford Nab., MD;  Location: [place];  Service: Gastroenterology;  Laterality: N/A;  . ESOPHAGOGASTRODUODENOSCOPY (EGD) WITH PROPOFOL N/A 07/14/2019   Procedure: ESOPHAGOGASTRODUODENOSCOPY (EGD) WITH PROPOFOL;  Surgeon: Otis Brace, MD;  Location: [facility] ENDOSCOPY;  Service: Gastroenterology;  Laterality: N/A;  . HERNIA REPAIR    . LEG SURGERY    . POLYPECTOMY  08/07/2018   Procedure: POLYPECTOMY;  Surgeon: Thornton Park, MD;  Location: [facility] ENDOSCOPY;  Service: Gastroenterology;;    Family History:  Family History  Problem Relation Age of Onset  . Diabetes Mother   . Alcoholism Mother   . Emphysema Father   . Lung  cancer Father   . Alcoholism Father     Social History:  Social History   Socioeconomic History  . Marital status: Divorced    Spouse name: Not on file  . Number of children: Not on file  . Years of education: Not on file  . Highest education level: Not on file  Occupational History  . Not on file  Tobacco Use  . Smoking status: Current Every Day Smoker    Packs/day: 1.00    Types: Cigarettes  . Smokeless tobacco: Never Used  Vaping Use  . Vaping Use: Never used  Substance and Sexual Activity  . Alcohol use: Yes    Comment: 5+ BEERS DAILY, Patient does not know how much he drank  tonight  . Drug use: Not Currently    Frequency: 3.0 times per week    Types: Cocaine  . Sexual activity: Not Currently  Other Topics Concern  . Not on file  Social History Narrative  . Not on file   Social Determinants of Health   Financial Resource Strain:   . Difficulty of Paying Living Expenses: Not on file  Food Insecurity:   . Worried About Charity fundraiser in the Last Year: Not on file  . Ran Out of Food in the Last Year: Not on file  Transportation Needs:   . Lack of Transportation (Medical): Not on file  . Lack of Transportation (Non-Medical): Not on file  Physical Activity:   . Days of Exercise per Week: Not on file  . Minutes of Exercise per Session: Not on file  Stress:   . Feeling of Stress : Not on file  Social Connections:   . Frequency of Communication with Friends and Family: Not on file  . Frequency of Social Gatherings with Friends and Family: Not on file  . Attends Religious Services: Not on file  . Active Member of Clubs or Organizations: Not on file  . Attends Archivist Meetings: Not on file  . Marital Status: Not on file  Intimate Partner Violence:   . Fear of Current or Ex-Partner: Not on file  . Emotionally Abused: Not on file  . Physically Abused: Not on file  . Sexually Abused: Not on file    SDOH:  SDOH Screenings   Alcohol Screen:   . Last Alcohol Screening Score (AUDIT): Not on file  Depression (PHQ2-9): Medium Risk  . PHQ-2 Score: 11  Financial Resource Strain:   . Difficulty of Paying Living Expenses: Not on file  Food Insecurity:   . Worried About Charity fundraiser in the Last Year: Not on file  . Ran Out of Food in the Last Year: Not on file  Housing:   . Last Housing Risk Score: Not on file  Physical Activity:   . Days of Exercise per Week: Not on file  . Minutes of Exercise per Session: Not on file  Social Connections:   . Frequency of Communication with Friends and Family: Not on file  . Frequency of Social  Gatherings with Friends and Family: Not on file  . Attends Religious Services: Not on file  . Active Member of Clubs or Organizations: Not on file  . Attends Archivist Meetings: Not on file  . Marital Status: Not on file  Stress:   . Feeling of Stress : Not on file  Tobacco Use: High Risk  . Smoking Tobacco Use: Current Every Day Smoker  . Smokeless Tobacco Use: Never Used  Transportation  Needs:   . Lack of Transportation (Medical): Not on file  . Lack of Transportation (Non-Medical): Not on file    Last Labs:  Admission on 12/27/2019, Discharged on 12/28/2019  Component Date Value Ref Range Status  . WBC 12/27/2019 4.9  4.0 - 10.5 K/uL Final  . RBC 12/27/2019 4.26  4.22 - 5.81 MIL/uL Final  . Hemoglobin 12/27/2019 9.7* 13.0 - 17.0 g/dL Final  . HCT 12/27/2019 32.3* 39 - 52 % Final  . MCV 12/27/2019 75.8* 80.0 - 100.0 fL Final  . MCH 12/27/2019 22.8* 26.0 - 34.0 pg Final  . MCHC 12/27/2019 30.0  30.0 - 36.0 g/dL Final  . RDW 12/27/2019 19.7* 11.5 - 15.5 % Final  . Platelets 12/27/2019 296  150 - 400 K/uL Final  . nRBC 12/27/2019 0.0  0.0 - 0.2 % Final  . Neutrophils Relative % 12/27/2019 34  % Final  . Neutro Abs 12/27/2019 1.7  1.7 - 7.7 K/uL Final  . Lymphocytes Relative 12/27/2019 46  % Final  . Lymphs Abs 12/27/2019 2.3  0.7 - 4.0 K/uL Final  . Monocytes Relative 12/27/2019 14  % Final  . Monocytes Absolute 12/27/2019 0.7  0.1 - 1.0 K/uL Final  . Eosinophils Relative 12/27/2019 4  % Final  . Eosinophils Absolute 12/27/2019 0.2  0 - 0 K/uL Final  . Basophils Relative 12/27/2019 2  % Final  . Basophils Absolute 12/27/2019 0.1  0 - 0 K/uL Final  . Immature Granulocytes 12/27/2019 0  % Final  . Abs Immature Granulocytes 12/27/2019 0.01  0.00 - 0.07 K/uL Final   Performed at [facility], [place] [address], [place], [place] [address]  . Sodium 12/27/2019 144  135 - 145 mmol/L Final  . Potassium 12/27/2019 3.7  3.5 - 5.1 mmol/L Final  . Chloride  12/27/2019 104  98 - 111 mmol/L Final  . CO2 12/27/2019 28  22 - 32 mmol/L Final  . Glucose, Bld 12/27/2019 118* 70 - 99 mg/dL Final   Glucose reference range applies only to samples taken after fasting for at least 8 hours.  . BUN 12/27/2019 5* 6 - 20 mg/dL Final  . Creatinine, Ser 12/27/2019 0.76  0.61 - 1.24 mg/dL Final  . Calcium 12/27/2019 9.2  8.9 - 10.3 mg/dL Final  . GFR calc non Af Amer 12/27/2019 >60  >60 mL/min Final  . Anion gap 12/27/2019 12  5 - 15 Final   Performed at [facility], [place] [address] [place], [place] [address]  . Alcohol, Ethyl (B) 12/27/2019 366* <10 mg/dL Final   Comment: CRITICAL RESULT CALLED TO, READ BACK BY AND VERIFIED WITH: NASH,J. RN @2006  ON 10.07.2021 BY COHEN,K (NOTE) Lowest detectable limit for serum alcohol is 10 mg/dL.  For medical purposes only. Performed at [facility], [place] [address], [place], [place] [address]   . Opiates 12/27/2019 NONE DETECTED  NONE DETECTED Final  . Cocaine 12/27/2019 NONE DETECTED  NONE DETECTED Final  . Benzodiazepines 12/27/2019 NONE DETECTED  NONE DETECTED Final  . Amphetamines 12/27/2019 NONE DETECTED  NONE DETECTED Final  . Tetrahydrocannabinol 12/27/2019 NONE DETECTED  NONE DETECTED Final  . Barbiturates 12/27/2019 NONE DETECTED  NONE DETECTED Final   Comment: (NOTE) DRUG SCREEN FOR MEDICAL PURPOSES ONLY.  IF CONFIRMATION IS NEEDED FOR ANY PURPOSE, NOTIFY LAB WITHIN 5 DAYS.  LOWEST DETECTABLE LIMITS FOR URINE DRUG SCREEN Drug Class  Cutoff (ng/mL) Amphetamine and metabolites    1000 Barbiturate and metabolites    200 Benzodiazepine                 630 Tricyclics and metabolites     300 Opiates and metabolites        300 Cocaine and metabolites        300 THC                            50 Performed at [facility], [place] [address] [place], [place] [address]   . SARS Coronavirus 2 by RT PCR 12/27/2019 NEGATIVE  NEGATIVE  Final   Comment: (NOTE) SARS-CoV-2 target nucleic acids are NOT DETECTED.  The SARS-CoV-2 RNA is generally detectable in upper respiratoy specimens during the acute phase of infection. The lowest concentration of SARS-CoV-2 viral copies this assay can detect is 131 copies/mL. A negative result does not preclude SARS-Cov-2 infection and should not be used as the sole basis for treatment or other patient management decisions. A negative result may occur with  improper specimen collection/handling, submission of specimen other than nasopharyngeal swab, presence of viral mutation(s) within the areas targeted by this assay, and inadequate number of viral copies (<131 copies/mL). A negative result must be combined with clinical observations, patient history, and epidemiological information. The expected result is Negative.  Fact Sheet for Patients:  PinkCheek.be  Fact Sheet for Healthcare Providers:  GravelBags.it  This test is no                          t yet approved or cleared by the [place] FDA and  has been authorized for detection and/or diagnosis of SARS-CoV-2 by FDA under an Emergency Use Authorization (EUA). This EUA will remain  in effect (meaning this test can be used) for the duration of the COVID-19 declaration under Section 564(b)(1) of the Act, 21 U.S.C. section 360bbb-3(b)(1), unless the authorization is terminated or revoked sooner.    . Influenza A by PCR 12/27/2019 NEGATIVE  NEGATIVE Final  . Influenza B by PCR 12/27/2019 NEGATIVE  NEGATIVE Final   Comment: (NOTE) The Xpert Xpress SARS-CoV-2/FLU/RSV assay is intended as an aid in  the diagnosis of influenza from Nasopharyngeal swab specimens and  should not be used as a sole basis for treatment. Nasal washings and  aspirates are unacceptable for Xpert Xpress SARS-CoV-2/FLU/RSV  testing.  Fact Sheet for  Patients: PinkCheek.be  Fact Sheet for Healthcare Providers: GravelBags.it  This test is not yet approved or cleared by the [place] FDA and  has been authorized for detection and/or diagnosis of SARS-CoV-2 by  FDA under an Emergency Use Authorization (EUA). This EUA will remain  in effect (meaning this test can be used) for the duration of the  Covid-19 declaration under Section 564(b)(1) of the Act, 21  U.S.C. section 360bbb-3(b)(1), unless the authorization is  terminated or revoked. Performed at [facility], [place] [address], [place], [place] [address]   Admission on 12/12/2019, Discharged on 12/13/2019  Component Date Value Ref Range Status  . SARS Coronavirus 2 12/12/2019 NEGATIVE  NEGATIVE Final   Comment: (NOTE) SARS-CoV-2 target nucleic acids are NOT DETECTED.  The SARS-CoV-2 RNA is generally detectable in upper and lower respiratory specimens during the acute phase of infection. The lowest concentration of SARS-CoV-2 viral copies this assay can detect is 250 copies / mL. A  negative result does not preclude SARS-CoV-2 infection and should not be used as the sole basis for treatment or other patient management decisions.  A negative result may occur with improper specimen collection / handling, submission of specimen other than nasopharyngeal swab, presence of viral mutation(s) within the areas targeted by this assay, and inadequate number of viral copies (<250 copies / mL). A negative result must be combined with clinical observations, patient history, and epidemiological information.  Fact Sheet for Patients:   StrictlyIdeas.no  Fact Sheet for Healthcare Providers: BankingDealers.co.za  This test is not yet approved or                           cleared by the [place] FDA and has been authorized for detection and/or diagnosis of  SARS-CoV-2 by FDA under an Emergency Use Authorization (EUA).  This EUA will remain in effect (meaning this test can be used) for the duration of the COVID-19 declaration under Section 564(b)(1) of the Act, 21 U.S.C. section 360bbb-3(b)(1), unless the authorization is terminated or revoked sooner.  Performed at [facility], [place] [address] [place], [place] [address]   . SARS Coronavirus 2 Ag 12/12/2019 Negative  Negative Preliminary  . WBC 12/12/2019 4.9  4.0 - 10.5 K/uL Final  . RBC 12/12/2019 4.42  4.22 - 5.81 MIL/uL Final  . Hemoglobin 12/12/2019 10.2* 13.0 - 17.0 g/dL Final  . HCT 12/12/2019 33.5* 39 - 52 % Final  . MCV 12/12/2019 75.8* 80.0 - 100.0 fL Final  . MCH 12/12/2019 23.1* 26.0 - 34.0 pg Final  . MCHC 12/12/2019 30.4  30.0 - 36.0 g/dL Final  . RDW 12/12/2019 21.0* 11.5 - 15.5 % Final  . Platelets 12/12/2019 76* 150 - 400 K/uL Final   Comment: REPEATED TO VERIFY Immature Platelet Fraction may be clinically indicated, consider ordering this additional test [id number] CONSISTENT WITH PREVIOUS RESULT   . nRBC 12/12/2019 0.0  0.0 - 0.2 % Final  . Neutrophils Relative % 12/12/2019 34  % Final  . Neutro Abs 12/12/2019 1.7  1.7 - 7.7 K/uL Final  . Lymphocytes Relative 12/12/2019 49  % Final  . Lymphs Abs 12/12/2019 2.4  0.7 - 4.0 K/uL Final  . Monocytes Relative 12/12/2019 13  % Final  . Monocytes Absolute 12/12/2019 0.6  0.1 - 1.0 K/uL Final  . Eosinophils Relative 12/12/2019 3  % Final  . Eosinophils Absolute 12/12/2019 0.1  0 - 0 K/uL Final  . Basophils Relative 12/12/2019 1  % Final  . Basophils Absolute 12/12/2019 0.1  0 - 0 K/uL Final  . Immature Granulocytes 12/12/2019 0  % Final  . Abs Immature Granulocytes 12/12/2019 0.01  0.00 - 0.07 K/uL Final   Performed at [facility], [place] [address] [place], [place] [address]  . Sodium 12/12/2019 137  135 - 145 mmol/L Final  . Potassium 12/12/2019 3.6  3.5 - 5.1 mmol/L Final  . Chloride 12/12/2019 100  98 -  111 mmol/L Final  . CO2 12/12/2019 24  22 - 32 mmol/L Final  . Glucose, Bld 12/12/2019 92  70 - 99 mg/dL Final   Glucose reference range applies only to samples taken after fasting for at least 8 hours.  . BUN 12/12/2019 <5* 6 - 20 mg/dL Final  . Creatinine, Ser 12/12/2019 0.80  0.61 - 1.24 mg/dL Final  . Calcium 12/12/2019 9.2  8.9 - 10.3 mg/dL Final  . Total Protein 12/12/2019 8.8* 6.5 -  8.1 g/dL Final  . Albumin 12/12/2019 4.4  3.5 - 5.0 g/dL Final  . AST 12/12/2019 75* 15 - 41 U/L Final  . ALT 12/12/2019 26  0 - 44 U/L Final  . Alkaline Phosphatase 12/12/2019 120  38 - 126 U/L Final  . Total Bilirubin 12/12/2019 0.7  0.3 - 1.2 mg/dL Final  . GFR calc non Af Amer 12/12/2019 >60  >60 mL/min Final  . GFR calc Af Amer 12/12/2019 >60  >60 mL/min Final  . Anion gap 12/12/2019 13  5 - 15 Final   Performed at [facility], [place] [address], [place], [place] [address]  . Alcohol, Ethyl (B) 12/12/2019 380* <10 mg/dL Final   Comment: CRITICAL RESULT CALLED TO, READ BACK BY AND VERIFIED WITH: LONDON L,RN 12/13/19 0023 WAYK Performed at [place] [address], [place], [place] [address]   . POC Amphetamine UR 12/12/2019 None Detected  None Detected Final  . POC Secobarbital (BAR) 12/12/2019 None Detected  None Detected Final  . POC Buprenorphine (BUP) 12/12/2019 None Detected  None Detected Final  . POC Oxazepam (BZO) 12/12/2019 Positive* None Detected Final  . POC Cocaine UR 12/12/2019 None Detected  None Detected Final  . POC Methamphetamine UR 12/12/2019 None Detected  None Detected Final  . POC Morphine 12/12/2019 None Detected  None Detected Final  . POC Oxycodone UR 12/12/2019 None Detected  None Detected Final  . POC Methadone UR 12/12/2019 None Detected  None Detected Final  . POC Marijuana UR 12/12/2019 None Detected  None Detected Final  Admission on 11/29/2019, Discharged on 11/29/2019  Component Date Value Ref Range Status  . Sodium 11/28/2019 141  135 - 145  mmol/L Final  . Potassium 11/28/2019 3.8  3.5 - 5.1 mmol/L Final  . Chloride 11/28/2019 102  98 - 111 mmol/L Final  . CO2 11/28/2019 27  22 - 32 mmol/L Final  . Glucose, Bld 11/28/2019 95  70 - 99 mg/dL Final   Glucose reference range applies only to samples taken after fasting for at least 8 hours.  . BUN 11/28/2019 7  6 - 20 mg/dL Final  . Creatinine, Ser 11/28/2019 0.64  0.61 - 1.24 mg/dL Final  . Calcium 11/28/2019 8.9  8.9 - 10.3 mg/dL Final  . Total Protein 11/28/2019 8.6* 6.5 - 8.1 g/dL Final  . Albumin 11/28/2019 4.3  3.5 - 5.0 g/dL Final  . AST 11/28/2019 39  15 - 41 U/L Final  . ALT 11/28/2019 21  0 - 44 U/L Final  . Alkaline Phosphatase 11/28/2019 116  38 - 126 U/L Final  . Total Bilirubin 11/28/2019 0.6  0.3 - 1.2 mg/dL Final  . GFR calc non Af Amer 11/28/2019 >60  >60 mL/min Final  . GFR calc Af Amer 11/28/2019 >60  >60 mL/min Final  . Anion gap 11/28/2019 12  5 - 15 Final   Performed at [facility], [place] [address], [place], [place] [address]  . Alcohol, Ethyl (B) 11/28/2019 326* <10 mg/dL Final   Comment: CRITICAL RESULT CALLED TO, READ BACK BY AND VERIFIED WITH: RN J SMITH AT 0023 11/29/19 CRUICKSHANK A (NOTE) Lowest detectable limit for serum alcohol is 10 mg/dL.  For medical purposes only. Performed at [facility], [place] [address], [place], [place] [address]   . WBC 11/28/2019 5.6  4.0 - 10.5 K/uL Final  . RBC 11/28/2019 4.10* 4.22 - 5.81 MIL/uL Final  . Hemoglobin 11/28/2019 9.4* 13.0 - 17.0 g/dL  Final  . HCT 11/28/2019 30.5* 39 - 52 % Final  . MCV 11/28/2019 74.4* 80.0 - 100.0 fL Final  . MCH 11/28/2019 22.9* 26.0 - 34.0 pg Final  . MCHC 11/28/2019 30.8  30.0 - 36.0 g/dL Final  . RDW 11/28/2019 20.6* 11.5 - 15.5 % Final  . Platelets 11/28/2019 180  150 - 400 K/uL Final  . nRBC 11/28/2019 0.0  0.0 - 0.2 % Final   Performed at [facility], [place] [address], [place], [place] [address]  Admission on  07/24/2019, Discharged on 07/24/2019  Component Date Value Ref Range Status  . Color, Urine 07/24/2019 STRAW* YELLOW Final  . APPearance 07/24/2019 CLEAR  CLEAR Final  . Specific Gravity, Urine 07/24/2019 1.002* 1.005 - 1.030 Final  . pH 07/24/2019 5.0  5.0 - 8.0 Final  . Glucose, UA 07/24/2019 NEGATIVE  NEGATIVE mg/dL Final  . Hgb urine dipstick 07/24/2019 NEGATIVE  NEGATIVE Final  . Bilirubin Urine 07/24/2019 NEGATIVE  NEGATIVE Final  . Ketones, ur 07/24/2019 NEGATIVE  NEGATIVE mg/dL Final  . Protein, ur 07/24/2019 NEGATIVE  NEGATIVE mg/dL Final  . Nitrite 07/24/2019 NEGATIVE  NEGATIVE Final  . Chalmers Guest 07/24/2019 NEGATIVE  NEGATIVE Final   Performed at [place] [address], [place], [place] [address]  . Opiates 07/24/2019 NONE DETECTED  NONE DETECTED Final  . Cocaine 07/24/2019 NONE DETECTED  NONE DETECTED Final  . Benzodiazepines 07/24/2019 NONE DETECTED  NONE DETECTED Final  . Amphetamines 07/24/2019 NONE DETECTED  NONE DETECTED Final  . Tetrahydrocannabinol 07/24/2019 NONE DETECTED  NONE DETECTED Final  . Barbiturates 07/24/2019 NONE DETECTED  NONE DETECTED Final   Comment: (NOTE) DRUG SCREEN FOR MEDICAL PURPOSES ONLY.  IF CONFIRMATION IS NEEDED FOR ANY PURPOSE, NOTIFY LAB WITHIN 5 DAYS. LOWEST DETECTABLE LIMITS FOR URINE DRUG SCREEN Drug Class                     Cutoff (ng/mL) Amphetamine and metabolites    1000 Barbiturate and metabolites    200 Benzodiazepine                 481 Tricyclics and metabolites     300 Opiates and metabolites        300 Cocaine and metabolites        300 THC                            50 Performed at [facility], [place] [address], [place], [place] [address]   . WBC 07/24/2019 4.2  4.0 - 10.5 K/uL Final  . RBC 07/24/2019 3.65* 4.22 - 5.81 MIL/uL Final  . Hemoglobin 07/24/2019 8.4* 13.0 - 17.0 g/dL Final   Comment: Reticulocyte Hemoglobin testing may be clinically indicated, consider  ordering this additional test [id number]   . HCT 07/24/2019 28.1* 39 - 52 % Final  . MCV 07/24/2019 77.0* 80.0 - 100.0 fL Final  . MCH 07/24/2019 23.0* 26.0 - 34.0 pg Final  . MCHC 07/24/2019 29.9* 30.0 - 36.0 g/dL Final  . RDW 07/24/2019 23.9* 11.5 - 15.5 % Final  . Platelets 07/24/2019 138* 150 - 400 K/uL Final   Comment: SPECIMEN CHECKED FOR CLOTS Immature Platelet Fraction may be clinically indicated, consider ordering this additional test [id number] PLATELET COUNT CONFIRMED BY SMEAR REPEATED TO VERIFY   . nRBC 07/24/2019 0.0  0.0 - 0.2 % Final  . Neutrophils Relative % 07/24/2019 43  % Final  .  Neutro Abs 07/24/2019 1.8  1.7 - 7.7 K/uL Final  . Lymphocytes Relative 07/24/2019 36  % Final  . Lymphs Abs 07/24/2019 1.5  0.7 - 4.0 K/uL Final  . Monocytes Relative 07/24/2019 15  % Final  . Monocytes Absolute 07/24/2019 0.6  0.1 - 1.0 K/uL Final  . Eosinophils Relative 07/24/2019 4  % Final  . Eosinophils Absolute 07/24/2019 0.2  0 - 0 K/uL Final  . Basophils Relative 07/24/2019 2  % Final  . Basophils Absolute 07/24/2019 0.1  0 - 0 K/uL Final  . Immature Granulocytes 07/24/2019 0  % Final  . Abs Immature Granulocytes 07/24/2019 0.00  0.00 - 0.07 K/uL Final  . Target Cells 07/24/2019 PRESENT   Final   Performed at [facility], [place] [address], [place], [place] [address]  . Sodium 07/24/2019 141  135 - 145 mmol/L Final  . Potassium 07/24/2019 3.9  3.5 - 5.1 mmol/L Final  . Chloride 07/24/2019 105  98 - 111 mmol/L Final  . CO2 07/24/2019 25  22 - 32 mmol/L Final  . Glucose, Bld 07/24/2019 93  70 - 99 mg/dL Final   Glucose reference range applies only to samples taken after fasting for at least 8 hours.  . BUN 07/24/2019 7  6 - 20 mg/dL Final  . Creatinine, Ser 07/24/2019 0.55* 0.61 - 1.24 mg/dL Final  . Calcium 07/24/2019 8.7* 8.9 - 10.3 mg/dL Final  . Total Protein 07/24/2019 8.5* 6.5 - 8.1 g/dL Final  . Albumin 07/24/2019 3.9  3.5 - 5.0 g/dL Final  . AST  07/24/2019 82* 15 - 41 U/L Final  . ALT 07/24/2019 38  0 - 44 U/L Final  . Alkaline Phosphatase 07/24/2019 175* 38 - 126 U/L Final  . Total Bilirubin 07/24/2019 0.8  0.3 - 1.2 mg/dL Final  . GFR calc non Af Amer 07/24/2019 >60  >60 mL/min Final  . GFR calc Af Amer 07/24/2019 >60  >60 mL/min Final  . Anion gap 07/24/2019 11  5 - 15 Final   Performed at [facility], [place] [address] [place], [place] [address]  . Alcohol, Ethyl (B) 07/24/2019 355* <10 mg/dL Final   Comment: CRITICAL RESULT CALLED TO, READ BACK BY AND VERIFIED WITH: G.GARRISON AT 8657 ON 07/24/19 BY N.THOMPSON (NOTE) Lowest detectable limit for serum alcohol is 10 mg/dL. For medical purposes only. Performed at [facility], [place] [address] [place],  [address]   Admission on 07/21/2019, Discharged on 07/21/2019  Component Date Value Ref Range Status  . Sodium 07/21/2019 127* 135 - 145 mmol/L Final  . Potassium 07/21/2019 3.4* 3.5 - 5.1 mmol/L Final  . Chloride 07/21/2019 95* 98 - 111 mmol/L Final  . CO2 07/21/2019 21* 22 - 32 mmol/L Final  . Glucose, Bld 07/21/2019 116* 70 - 99 mg/dL Final   Glucose reference range applies only to samples taken after fasting for at least 8 hours.  . BUN 07/21/2019 5* 6 - 20 mg/dL Final  . Creatinine, Ser 07/21/2019 0.71  0.61 - 1.24 mg/dL Final  . Calcium 07/21/2019 9.4  8.9 - 10.3 mg/dL Final  . Total Protein 07/21/2019 8.1  6.5 - 8.1 g/dL Final  . Albumin 07/21/2019 3.5  3.5 - 5.0 g/dL Final  . AST 07/21/2019 108* 15 - 41 U/L Final  . ALT 07/21/2019 35  0 - 44 U/L Final  . Alkaline Phosphatase 07/21/2019 187* 38 - 126 U/L Final  . Total Bilirubin 07/21/2019 0.8  0.3 -  1.2 mg/dL Final  . GFR calc non Af Amer 07/21/2019 >60  >60 mL/min Final  . GFR calc Af Amer 07/21/2019 >60  >60 mL/min Final  . Anion gap 07/21/2019 11  5 - 15 Final   Performed at [facility], [place] [address] [place], [place] [address]  . Alcohol, Ethyl (B)  07/21/2019 320* <10 mg/dL Final   Comment: CRITICAL RESULT CALLED TO, READ BACK BY AND VERIFIED WITH: RN R SANGLANG @0132  07/21/19 BY S GEZAHEGN (NOTE) Lowest detectable limit for serum alcohol is 10 mg/dL. For medical purposes only. Performed at [facility], [place] [address], [place], [place] [address]   . Salicylate Lvl 47/42/5956 <7.0* 7.0 - 30.0 mg/dL Final   Performed at [place] [address], [place], [place] [address]  . Acetaminophen (Tylenol), Serum 07/21/2019 <10* 10 - 30 ug/mL Final   Comment: (NOTE) Therapeutic concentrations vary significantly. A range of 10-30 ug/mL  may be an effective concentration for many patients. However, some  are best treated at concentrations outside of this range. Acetaminophen concentrations >150 ug/mL at 4 hours after ingestion  and >50 ug/mL at 12 hours after ingestion are often associated with  toxic reactions. Performed at [facility], [place] [address], [place], [place] [address]   . WBC 07/21/2019 7.1  4.0 - 10.5 K/uL Final  . RBC 07/21/2019 3.75* 4.22 - 5.81 MIL/uL Final  . Hemoglobin 07/21/2019 8.6* 13.0 - 17.0 g/dL Final   Comment: Reticulocyte Hemoglobin testing may be clinically indicated, consider ordering this additional test [id number]   . HCT 07/21/2019 29.0* 39 - 52 % Final  . MCV 07/21/2019 77.3* 80.0 - 100.0 fL Final  . MCH 07/21/2019 22.9* 26.0 - 34.0 pg Final  . MCHC 07/21/2019 29.7* 30.0 - 36.0 g/dL Final  . RDW 07/21/2019 23.8* 11.5 - 15.5 % Final  . Platelets 07/21/2019 105* 150 - 400 K/uL Final   Comment: Immature Platelet Fraction may be clinically indicated, consider ordering this additional test [id number] REPEATED TO VERIFY   . nRBC 07/21/2019 0.0  0.0 - 0.2 % Final   Performed at [facility], [place] [address], [place], [place] [address]  . Opiates 07/21/2019 NONE DETECTED  NONE DETECTED Final  . Cocaine 07/21/2019 NONE DETECTED  NONE DETECTED Final  . Benzodiazepines 07/21/2019  NONE DETECTED  NONE DETECTED Final  . Amphetamines 07/21/2019 NONE DETECTED  NONE DETECTED Final  . Tetrahydrocannabinol 07/21/2019 NONE DETECTED  NONE DETECTED Final  . Barbiturates 07/21/2019 NONE DETECTED  NONE DETECTED Final   Comment: (NOTE) DRUG SCREEN FOR MEDICAL PURPOSES ONLY.  IF CONFIRMATION IS NEEDED FOR ANY PURPOSE, NOTIFY LAB WITHIN 5 DAYS. LOWEST DETECTABLE LIMITS FOR URINE DRUG SCREEN Drug Class                     Cutoff (ng/mL) Amphetamine and metabolites    1000 Barbiturate and metabolites    200 Benzodiazepine                 557 Tricyclics and metabolites     300 Opiates and metabolites        300 Cocaine and metabolites        300 THC                            50 Performed at [facility], [place] [address], [place],  [address]  Admission on 07/18/2019, Discharged on 07/20/2019  Component Date Value Ref Range Status  . WBC 07/19/2019 4.0  4.0 - 10.5 K/uL Final  . RBC 07/19/2019 3.14* 4.22 - 5.81 MIL/uL Final  . Hemoglobin 07/19/2019 7.3* 13.0 - 17.0 g/dL Final   Comment: Reticulocyte Hemoglobin testing may be clinically indicated, consider ordering this additional test [id number]   . HCT 07/19/2019 24.3* 39 - 52 % Final  . MCV 07/19/2019 77.4* 80.0 - 100.0 fL Final  . MCH 07/19/2019 23.2* 26.0 - 34.0 pg Final  . MCHC 07/19/2019 30.0  30.0 - 36.0 g/dL Final  . RDW 07/19/2019 24.5* 11.5 - 15.5 % Final  . Platelets 07/19/2019 105* 150 - 400 K/uL Final   Comment: CONSISTENT WITH PREVIOUS RESULT Immature Platelet Fraction may be clinically indicated, consider ordering this additional test [id number] PLATELET COUNT CONFIRMED BY SMEAR   . nRBC 07/19/2019 0.0  0.0 - 0.2 % Final  . Neutrophils Relative % 07/19/2019 31  % Final  . Neutro Abs 07/19/2019 1.2* 1.7 - 7.7 K/uL Final  . Lymphocytes Relative 07/19/2019 44  % Final  . Lymphs Abs 07/19/2019 1.8  0.7 - 4.0 K/uL Final  . Monocytes Relative 07/19/2019 19  % Final  . Monocytes Absolute  07/19/2019 0.8  0.1 - 1.0 K/uL Final  . Eosinophils Relative 07/19/2019 4  % Final  . Eosinophils Absolute 07/19/2019 0.1  0 - 0 K/uL Final  . Basophils Relative 07/19/2019 2  % Final  . Basophils Absolute 07/19/2019 0.1  0 - 0 K/uL Final  . Immature Granulocytes 07/19/2019 0  % Final  . Abs Immature Granulocytes 07/19/2019 0.01  0.00 - 0.07 K/uL Final  . Polychromasia 07/19/2019 PRESENT   Final  . Target Cells 07/19/2019 PRESENT   Final   Performed at [facility], [place] [address] [place], [place] [address]  . Sodium 07/19/2019 137  135 - 145 mmol/L Final  . Potassium 07/19/2019 3.7  3.5 - 5.1 mmol/L Final  . Chloride 07/19/2019 104  98 - 111 mmol/L Final  . CO2 07/19/2019 25  22 - 32 mmol/L Final  . Glucose, Bld 07/19/2019 100* 70 - 99 mg/dL Final   Glucose reference range applies only to samples taken after fasting for at least 8 hours.  . BUN 07/19/2019 7  6 - 20 mg/dL Final  . Creatinine, Ser 07/19/2019 0.65  0.61 - 1.24 mg/dL Final  . Calcium 07/19/2019 8.3* 8.9 - 10.3 mg/dL Final  . Total Protein 07/19/2019 8.0  6.5 - 8.1 g/dL Final  . Albumin 07/19/2019 3.4* 3.5 - 5.0 g/dL Final  . AST 07/19/2019 105* 15 - 41 U/L Final  . ALT 07/19/2019 29  0 - 44 U/L Final  . Alkaline Phosphatase 07/19/2019 205* 38 - 126 U/L Final  . Total Bilirubin 07/19/2019 0.6  0.3 - 1.2 mg/dL Final  . GFR calc non Af Amer 07/19/2019 >60  >60 mL/min Final  . GFR calc Af Amer 07/19/2019 >60  >60 mL/min Final  . Anion gap 07/19/2019 8  5 - 15 Final   Performed at [facility], [place] [address] [place], [place] [address]  . Alcohol, Ethyl (B) 07/19/2019 323* <10 mg/dL Final   Comment: CRITICAL RESULT CALLED TO, READ BACK BY AND VERIFIED WITH: RN J NASH AT 0043 07/19/19 CRUICKSHANK A  (NOTE) Lowest detectable limit for serum alcohol is 10 mg/dL. For medical purposes only. Performed at [facility], [place] [address] [place],  [address]   .  Salicylate Lvl 12/45/8099 <7.0* 7.0 - 30.0 mg/dL Final   Performed at [place] [address] [place], [place] [address]  . Acetaminophen (Tylenol), Serum 07/19/2019 <10* 10 - 30 ug/mL Final   Performed at [facility], [place] [address], [place], [place] [address]  . Lipase 07/19/2019 73* 11 - 51 U/L Final   Performed at [facility], [place] [address] [place], [place] [address]  . Fecal Occult Bld 07/19/2019 POSITIVE* NEGATIVE Final  . Prothrombin Time 07/19/2019 13.5  11.4 - 15.2 seconds Final  . INR 07/19/2019 1.1  0.8 - 1.2 Final   Comment: (NOTE) INR goal varies based on device and disease states. Performed at [facility], [place] [address], [place], [place] [address]   . ABO/RH(D) 07/19/2019 O POS   Final  . Antibody Screen 07/19/2019 NEG   Final  . Sample Expiration 07/19/2019    Final                   Value:07/22/2019,2359 Performed at [facility], [place] [address], [place], [place] [address]   . WBC 07/19/2019 3.2* 4.0 - 10.5 K/uL Final  . RBC 07/19/2019 3.13* 4.22 - 5.81 MIL/uL Final  . Hemoglobin 07/19/2019 7.3* 13.0 - 17.0 g/dL Final   Comment: Reticulocyte Hemoglobin testing may be clinically indicated, consider ordering this additional test [id number]   . HCT 07/19/2019 24.2* 39 - 52 % Final  . MCV 07/19/2019 77.3* 80.0 - 100.0 fL Final  . MCH 07/19/2019 23.3* 26.0 - 34.0 pg Final  . MCHC 07/19/2019 30.2  30.0 - 36.0 g/dL Final  . RDW 07/19/2019 24.4* 11.5 - 15.5 % Final  . Platelets 07/19/2019 92* 150 - 400 K/uL Final   Comment: Immature Platelet Fraction may be clinically indicated, consider ordering this additional test [id number]   . nRBC 07/19/2019 0.0  0.0 - 0.2 % Final   Performed at [place] [address], [place], [place] [address]  . WBC 07/19/2019 3.3* 4.0 - 10.5 K/uL Final  . RBC 07/19/2019 3.37* 4.22 - 5.81 MIL/uL Final  . Hemoglobin  07/19/2019 7.8* 13.0 - 17.0 g/dL Final   Comment: Reticulocyte Hemoglobin testing may be clinically indicated, consider ordering this additional test [id number]   . HCT 07/19/2019 25.9* 39 - 52 % Final  . MCV 07/19/2019 76.9* 80.0 - 100.0 fL Final  . MCH 07/19/2019 23.1* 26.0 - 34.0 pg Final  . MCHC 07/19/2019 30.1  30.0 - 36.0 g/dL Final  . RDW 07/19/2019 24.3* 11.5 - 15.5 % Final  . Platelets 07/19/2019 90* 150 - 400 K/uL Final   Comment: SPECIMEN CHECKED FOR CLOTS CONSISTENT WITH PREVIOUS RESULT Immature Platelet Fraction may be clinically indicated, consider ordering this additional test [id number] PLATELET COUNT CONFIRMED BY SMEAR   . nRBC 07/19/2019 0.0  0.0 - 0.2 % Final   Performed at [place] [address] [place], [place] [address]  . SARS Coronavirus 2 by RT PCR 07/19/2019 NEGATIVE  NEGATIVE Final   Comment: (NOTE) SARS-CoV-2 target nucleic acids are NOT DETECTED. The SARS-CoV-2 RNA is generally detectable in upper respiratoy specimens during the acute phase of infection. The lowest concentration of SARS-CoV-2 viral copies this assay can detect is 131 copies/mL. A negative result does not preclude SARS-Cov-2 infection and should not be used as the sole basis for treatment or other patient management decisions. A negative result may occur with  improper specimen collection/handling, submission of  specimen other than nasopharyngeal swab, presence of viral mutation(s) within the areas targeted by this assay, and inadequate number of viral copies (<131 copies/mL). A negative result must be combined with clinical observations, patient history, and epidemiological information. The expected result is Negative. Fact Sheet for Patients:  PinkCheek.be Fact Sheet for Healthcare Providers:  GravelBags.it This test is not yet ap                          proved or cleared by the [place] FDA and   has been authorized for detection and/or diagnosis of SARS-CoV-2 by FDA under an Emergency Use Authorization (EUA). This EUA will remain  in effect (meaning this test can be used) for the duration of the COVID-19 declaration under Section 564(b)(1) of the Act, 21 U.S.C. section 360bbb-3(b)(1), unless the authorization is terminated or revoked sooner.   . Influenza A by PCR 07/19/2019 NEGATIVE  NEGATIVE Final  . Influenza B by PCR 07/19/2019 NEGATIVE  NEGATIVE Final   Comment: (NOTE) The Xpert Xpress SARS-CoV-2/FLU/RSV assay is intended as an aid in  the diagnosis of influenza from Nasopharyngeal swab specimens and  should not be used as a sole basis for treatment. Nasal washings and  aspirates are unacceptable for Xpert Xpress SARS-CoV-2/FLU/RSV  testing. Fact Sheet for Patients: PinkCheek.be Fact Sheet for Healthcare Providers: GravelBags.it This test is not yet approved or cleared by the [place] FDA and  has been authorized for detection and/or diagnosis of SARS-CoV-2 by  FDA under an Emergency Use Authorization (EUA). This EUA will remain  in effect (meaning this test can be used) for the duration of the  Covid-19 declaration under Section 564(b)(1) of the Act, 21  U.S.C. section 360bbb-3(b)(1), unless the authorization is  terminated or revoked. Performed at [facility], [place] [address] [place], [place] [address]   . Sodium 07/19/2019 138  135 - 145 mmol/L Final  . Potassium 07/19/2019 3.6  3.5 - 5.1 mmol/L Final  . Chloride 07/19/2019 104  98 - 111 mmol/L Final  . CO2 07/19/2019 24  22 - 32 mmol/L Final  . Glucose, Bld 07/19/2019 89  70 - 99 mg/dL Final   Glucose reference range applies only to samples taken after fasting for at least 8 hours.  . BUN 07/19/2019 8  6 - 20 mg/dL Final  . Creatinine, Ser 07/19/2019 0.65  0.61 - 1.24 mg/dL Final  . Calcium 07/19/2019 8.2* 8.9 - 10.3 mg/dL Final   . Total Protein 07/19/2019 7.7  6.5 - 8.1 g/dL Final  . Albumin 07/19/2019 3.4* 3.5 - 5.0 g/dL Final  . AST 07/19/2019 88* 15 - 41 U/L Final  . ALT 07/19/2019 29  0 - 44 U/L Final  . Alkaline Phosphatase 07/19/2019 176* 38 - 126 U/L Final  . Total Bilirubin 07/19/2019 0.9  0.3 - 1.2 mg/dL Final  . GFR calc non Af Amer 07/19/2019 >60  >60 mL/min Final  . GFR calc Af Amer 07/19/2019 >60  >60 mL/min Final  . Anion gap 07/19/2019 10  5 - 15 Final   Performed at [facility], [place] [address], [place], [place] [address]  . Magnesium 07/19/2019 2.0  1.7 - 2.4 mg/dL Final   Performed at [place] [address] [place], [place] [address]  . Phosphorus 07/19/2019 3.8  2.5 - 4.6 mg/dL Final   Performed at [place] [address], [place], [place] [address]  .  WBC 07/20/2019 3.7* 4.0 - 10.5 K/uL Final  . RBC 07/20/2019 3.40* 4.22 - 5.81 MIL/uL Final  . Hemoglobin 07/20/2019 7.9* 13.0 - 17.0 g/dL Final   Comment: Reticulocyte Hemoglobin testing may be clinically indicated, consider ordering this additional test [id number]   . HCT 07/20/2019 26.3* 39 - 52 % Final  . MCV 07/20/2019 77.4* 80.0 - 100.0 fL Final  . MCH 07/20/2019 23.2* 26.0 - 34.0 pg Final  . MCHC 07/20/2019 30.0  30.0 - 36.0 g/dL Final  . RDW 07/20/2019 24.5* 11.5 - 15.5 % Final  . Platelets 07/20/2019 87* 150 - 400 K/uL Final   Comment: CONSISTENT WITH PREVIOUS RESULT Immature Platelet Fraction may be clinically indicated, consider ordering this additional test [id number] REPEATED TO VERIFY   . nRBC 07/20/2019 0.0  0.0 - 0.2 % Final   Performed at [place] [address], [place], [place] [address]  . Magnesium 07/20/2019 1.9  1.7 - 2.4 mg/dL Final   Performed at [place] [address], [place], [place] [address]  . Sodium 07/20/2019 132* 135 - 145 mmol/L Final  . Potassium 07/20/2019 3.6  3.5 - 5.1 mmol/L Final   . Chloride 07/20/2019 98  98 - 111 mmol/L Final  . CO2 07/20/2019 25  22 - 32 mmol/L Final  . Glucose, Bld 07/20/2019 100* 70 - 99 mg/dL Final   Glucose reference range applies only to samples taken after fasting for at least 8 hours.  . BUN 07/20/2019 7  6 - 20 mg/dL Final  . Creatinine, Ser 07/20/2019 0.60* 0.61 - 1.24 mg/dL Final  . Calcium 07/20/2019 8.7* 8.9 - 10.3 mg/dL Final  . Total Protein 07/20/2019 7.5  6.5 - 8.1 g/dL Final  . Albumin 07/20/2019 3.4* 3.5 - 5.0 g/dL Final  . AST 07/20/2019 92* 15 - 41 U/L Final  . ALT 07/20/2019 29  0 - 44 U/L Final  . Alkaline Phosphatase 07/20/2019 165* 38 - 126 U/L Final  . Total Bilirubin 07/20/2019 2.0* 0.3 - 1.2 mg/dL Final  . GFR calc non Af Amer 07/20/2019 >60  >60 mL/min Final  . GFR calc Af Amer 07/20/2019 >60  >60 mL/min Final  . Anion gap 07/20/2019 9  5 - 15 Final   Performed at [facility], [place] [address], [place], [place] [address]  . WBC 07/20/2019 3.7* 4.0 - 10.5 K/uL Final  . RBC 07/20/2019 3.59* 4.22 - 5.81 MIL/uL Final  . Hemoglobin 07/20/2019 8.4* 13.0 - 17.0 g/dL Final   Comment: Reticulocyte Hemoglobin testing may be clinically indicated, consider ordering this additional test [id number]   . HCT 07/20/2019 27.6* 39 - 52 % Final  . MCV 07/20/2019 76.9* 80.0 - 100.0 fL Final  . MCH 07/20/2019 23.4* 26.0 - 34.0 pg Final  . MCHC 07/20/2019 30.4  30.0 - 36.0 g/dL Final  . RDW 07/20/2019 24.0* 11.5 - 15.5 % Final  . Platelets 07/20/2019 95* 150 - 400 K/uL Final   Comment: CONSISTENT WITH PREVIOUS RESULT Immature Platelet Fraction may be clinically indicated, consider ordering this additional test [id number]   . nRBC 07/20/2019 0.0  0.0 - 0.2 % Final   Performed at [place] [address] [place], [place] [address]  Admission on 07/13/2019, Discharged on 07/15/2019  Component Date Value Ref Range Status  . Sodium 07/13/2019 140  135 - 145 mmol/L Final  . Potassium  07/13/2019 3.8  3.5 - 5.1 mmol/L Final  . Chloride 07/13/2019 103  98 - 111 mmol/L Final  . CO2 07/13/2019 27  22 - 32 mmol/L Final  . Glucose, Bld 07/13/2019 103* 70 - 99 mg/dL Final   Glucose reference range applies only to samples taken after fasting for at least 8 hours.  . BUN 07/13/2019 9  6 - 20 mg/dL Final  . Creatinine, Ser 07/13/2019 0.74  0.61 - 1.24 mg/dL Final  . Calcium 07/13/2019 8.7* 8.9 - 10.3 mg/dL Final  . Total Protein 07/13/2019 8.6* 6.5 - 8.1 g/dL Final  . Albumin 07/13/2019 3.6  3.5 - 5.0 g/dL Final  . AST 07/13/2019 113* 15 - 41 U/L Final  . ALT 07/13/2019 30  0 - 44 U/L Final  . Alkaline Phosphatase 07/13/2019 181* 38 - 126 U/L Final  . Total Bilirubin 07/13/2019 0.6  0.3 - 1.2 mg/dL Final  . GFR calc non Af Amer 07/13/2019 >60  >60 mL/min Final  . GFR calc Af Amer 07/13/2019 >60  >60 mL/min Final  . Anion gap 07/13/2019 10  5 - 15 Final   Performed at [facility], [place] [address], [place], [place] [address]  . WBC 07/13/2019 4.5  4.0 - 10.5 K/uL Final  . RBC 07/13/2019 3.83* 4.22 - 5.81 MIL/uL Final  . Hemoglobin 07/13/2019 8.7* 13.0 - 17.0 g/dL Final   Comment: Reticulocyte Hemoglobin testing may be clinically indicated, consider ordering this additional test [id number]   . HCT 07/13/2019 29.3* 39 - 52 % Final  . MCV 07/13/2019 76.5* 80.0 - 100.0 fL Final  . MCH 07/13/2019 22.7* 26.0 - 34.0 pg Final  . MCHC 07/13/2019 29.7* 30.0 - 36.0 g/dL Final  . RDW 07/13/2019 24.7* 11.5 - 15.5 % Final  . Platelets 07/13/2019 128* 150 - 400 K/uL Final  . nRBC 07/13/2019 0.0  0.0 - 0.2 % Final  . Neutrophils Relative % 07/13/2019 47  % Final  . Neutro Abs 07/13/2019 2.1  1.7 - 7.7 K/uL Final  . Lymphocytes Relative 07/13/2019 35  % Final  . Lymphs Abs 07/13/2019 1.6  0.7 - 4.0 K/uL Final  . Monocytes Relative 07/13/2019 13  % Final  . Monocytes Absolute 07/13/2019 0.6  0.1 - 1.0 K/uL Final  . Eosinophils Relative 07/13/2019 3  % Final  .  Eosinophils Absolute 07/13/2019 0.1  0 - 0 K/uL Final  . Basophils Relative 07/13/2019 2  % Final  . Basophils Absolute 07/13/2019 0.1  0 - 0 K/uL Final  . Immature Granulocytes 07/13/2019 0  % Final  . Abs Immature Granulocytes 07/13/2019 0.00  0.00 - 0.07 K/uL Final   Performed at [facility], [place] [address] [place], [place] [address]  . Alcohol, Ethyl (B) 07/13/2019 351* <10 mg/dL Final   Comment: CRITICAL RESULT CALLED TO, READ BACK BY AND VERIFIED WITH: J,NASH AT 2323 ON 07/13/19 BY A,MOHAMED CALLED X3 TO GIVE CRITICAL (NOTE) Lowest detectable limit for serum alcohol is 10 mg/dL. For medical purposes only. Performed at [facility], [place] [address] [place], [place] [address]   . Fecal Occult Bld 07/13/2019 POSITIVE* NEGATIVE Final  . Prothrombin Time 07/13/2019 13.7  11.4 - 15.2 seconds Final  . INR 07/13/2019 1.1  0.8 - 1.2 Final   Comment: (NOTE) INR goal varies based on device and disease states. Performed at [facility], [place] [address] [place], [place] [address]   . ABO/RH(D) 07/13/2019 O POS   Final  . Antibody Screen 07/13/2019 NEG   Final  . Sample Expiration  07/13/2019    Final                   Value:07/16/2019,2359 Performed at [facility], [place] [address], [place], [place] [address]   . SARS Coronavirus 2 by RT PCR 07/14/2019 NEGATIVE  NEGATIVE Final   Comment: (NOTE) SARS-CoV-2 target nucleic acids are NOT DETECTED. The SARS-CoV-2 RNA is generally detectable in upper respiratoy specimens during the acute phase of infection. The lowest concentration of SARS-CoV-2 viral copies this assay can detect is 131 copies/mL. A negative result does not preclude SARS-Cov-2 infection and should not be used as the sole basis for treatment or other patient management decisions. A negative result may occur with  improper specimen collection/handling, submission of specimen other than nasopharyngeal  swab, presence of viral mutation(s) within the areas targeted by this assay, and inadequate number of viral copies (<131 copies/mL). A negative result must be combined with clinical observations, patient history, and epidemiological information. The expected result is Negative. Fact Sheet for Patients:  PinkCheek.be Fact Sheet for Healthcare Providers:  GravelBags.it This test is not yet ap                          proved or cleared by the [place] FDA and  has been authorized for detection and/or diagnosis of SARS-CoV-2 by FDA under an Emergency Use Authorization (EUA). This EUA will remain  in effect (meaning this test can be used) for the duration of the COVID-19 declaration under Section 564(b)(1) of the Act, 21 U.S.C. section 360bbb-3(b)(1), unless the authorization is terminated or revoked sooner.   . Influenza A by PCR 07/14/2019 NEGATIVE  NEGATIVE Final  . Influenza B by PCR 07/14/2019 NEGATIVE  NEGATIVE Final   Comment: (NOTE) The Xpert Xpress SARS-CoV-2/FLU/RSV assay is intended as an aid in  the diagnosis of influenza from Nasopharyngeal swab specimens and  should not be used as a sole basis for treatment. Nasal washings and  aspirates are unacceptable for Xpert Xpress SARS-CoV-2/FLU/RSV  testing. Fact Sheet for Patients: PinkCheek.be Fact Sheet for Healthcare Providers: GravelBags.it This test is not yet approved or cleared by the [place] FDA and  has been authorized for detection and/or diagnosis of SARS-CoV-2 by  FDA under an Emergency Use Authorization (EUA). This EUA will remain  in effect (meaning this test can be used) for the duration of the  Covid-19 declaration under Section 564(b)(1) of the Act, 21  U.S.C. section 360bbb-3(b)(1), unless the authorization is  terminated or revoked. Performed at [facility], [place]  [address], [place], [place] [address]   . Sodium 07/14/2019 140  135 - 145 mmol/L Final  . Potassium 07/14/2019 4.0  3.5 - 5.1 mmol/L Final  . Chloride 07/14/2019 104  98 - 111 mmol/L Final  . CO2 07/14/2019 24  22 - 32 mmol/L Final  . Glucose, Bld 07/14/2019 88  70 - 99 mg/dL Final   Glucose reference range applies only to samples taken after fasting for at least 8 hours.  . BUN 07/14/2019 11  6 - 20 mg/dL Final  . Creatinine, Ser 07/14/2019 0.63  0.61 - 1.24 mg/dL Final  . Calcium 07/14/2019 8.5* 8.9 - 10.3 mg/dL Final  . Total Protein 07/14/2019 7.6  6.5 - 8.1 g/dL Final  . Albumin 07/14/2019 3.4* 3.5 - 5.0 g/dL Final  . AST 07/14/2019 96* 15 - 41 U/L Final  . ALT 07/14/2019 27  0 - 44 U/L  Final  . Alkaline Phosphatase 07/14/2019 159* 38 - 126 U/L Final  . Total Bilirubin 07/14/2019 0.6  0.3 - 1.2 mg/dL Final  . GFR calc non Af Amer 07/14/2019 >60  >60 mL/min Final  . GFR calc Af Amer 07/14/2019 >60  >60 mL/min Final  . Anion gap 07/14/2019 12  5 - 15 Final   Performed at [facility], [place] [address], [place], [place] [address]  . Magnesium 07/14/2019 2.3  1.7 - 2.4 mg/dL Final   Performed at [place] [address] [place], [place] [address]  . Phosphorus 07/14/2019 3.9  2.5 - 4.6 mg/dL Final   Performed at [place] [address] [place], [place] [address]  . WBC 07/14/2019 3.4* 4.0 - 10.5 K/uL Final  . RBC 07/14/2019 3.42* 4.22 - 5.81 MIL/uL Final  . Hemoglobin 07/14/2019 7.8* 13.0 - 17.0 g/dL Final   Comment: Reticulocyte Hemoglobin testing may be clinically indicated, consider ordering this additional test [id number]   . HCT 07/14/2019 26.3* 39 - 52 % Final  . MCV 07/14/2019 76.9* 80.0 - 100.0 fL Final  . MCH 07/14/2019 22.8* 26.0 - 34.0 pg Final  . MCHC 07/14/2019 29.7* 30.0 - 36.0 g/dL Final  . RDW 07/14/2019 24.7* 11.5 - 15.5 % Final  . Platelets 07/14/2019 118* 150 - 400 K/uL Final   Comment: SPECIMEN  CHECKED FOR CLOTS Immature Platelet Fraction may be clinically indicated, consider ordering this additional test [id number] PLATELET COUNT CONFIRMED BY SMEAR REPEATED TO VERIFY   . nRBC 07/14/2019 0.0  0.0 - 0.2 % Final   Performed at [place] [address], [place], [place] [address]  . Hemoglobin 07/14/2019 7.8* 13.0 - 17.0 g/dL Final   Performed at [facility], [place] [name] [place], [place] [address]  . Hemoglobin 07/14/2019 7.8* 13.0 - 17.0 g/dL Final   Performed at [facility], [place] [address], [place], [place] [address]  . HCT 07/14/2019 26.0* 39 - 52 % Final   Performed at [facility], [place] [address], [place], [place] [address]  . HCT 07/14/2019 26.3* 39 - 52 % Final   Performed at [facility], [place] [address] [place], [place] [address]  . WBC 07/15/2019 4.0  4.0 - 10.5 K/uL Final  . RBC 07/15/2019 3.57* 4.22 - 5.81 MIL/uL Final  . Hemoglobin 07/15/2019 8.2* 13.0 - 17.0 g/dL Final   Comment: Reticulocyte Hemoglobin testing may be clinically indicated, consider ordering this additional test [id number]   . HCT 07/15/2019 27.4* 39 - 52 % Final  . MCV 07/15/2019 76.8* 80.0 - 100.0 fL Final  . MCH 07/15/2019 23.0* 26.0 - 34.0 pg Final  . MCHC 07/15/2019 29.9* 30.0 - 36.0 g/dL Final  . RDW 07/15/2019 24.2* 11.5 - 15.5 % Final  . Platelets 07/15/2019 96* 150 - 400 K/uL Final   Comment: CONSISTENT WITH PREVIOUS RESULT Immature Platelet Fraction may be clinically indicated, consider ordering this additional test [id number] REPEATED TO VERIFY   . nRBC 07/15/2019 0.0  0.0 - 0.2 % Final   Performed at [place] [address], [place], [place] [address]  . Sodium 07/15/2019 135  135 - 145 mmol/L Final  . Potassium 07/15/2019 3.7  3.5 - 5.1 mmol/L Final  . Chloride 07/15/2019 102  98 - 111 mmol/L Final  . CO2 07/15/2019 24  22 - 32 mmol/L Final  . Glucose,  Bld 07/15/2019 92  70 - 99 mg/dL Final   Glucose reference range applies only to samples taken after fasting for at least 8 hours.  . BUN 07/15/2019 11  6 - 20 mg/dL Final  . Creatinine, Ser 07/15/2019 0.73  0.61 - 1.24 mg/dL Final  . Calcium 07/15/2019 8.7* 8.9 - 10.3 mg/dL Final  . Total Protein 07/15/2019 7.8  6.5 - 8.1 g/dL Final  . Albumin 07/15/2019 3.2* 3.5 - 5.0 g/dL Final  . AST 07/15/2019 95* 15 - 41 U/L Final  . ALT 07/15/2019 28  0 - 44 U/L Final  . Alkaline Phosphatase 07/15/2019 174* 38 - 126 U/L Final  . Total Bilirubin 07/15/2019 1.7* 0.3 - 1.2 mg/dL Final  . GFR calc non Af Amer 07/15/2019 >60  >60 mL/min Final  . GFR calc Af Amer 07/15/2019 >60  >60 mL/min Final  . Anion gap 07/15/2019 9  5 - 15 Final   Performed at [facility], [place] [address] [place], [place] [address]  . Ammonia 07/15/2019 48* 9 - 35 umol/L Final   Performed at [facility], [place] [address] [place], [place] [address]  Admission on 07/05/2019, Discharged on 07/06/2019  Component Date Value Ref Range Status  . Sodium 07/05/2019 128* 135 - 145 mmol/L Final  . Potassium 07/05/2019 3.3* 3.5 - 5.1 mmol/L Final  . Chloride 07/05/2019 96* 98 - 111 mmol/L Final  . CO2 07/05/2019 20* 22 - 32 mmol/L Final  . Glucose, Bld 07/05/2019 93  70 - 99 mg/dL Final   Glucose reference range applies only to samples taken after fasting for at least 8 hours.  . BUN 07/05/2019 5* 6 - 20 mg/dL Final  . Creatinine, Ser 07/05/2019 0.68  0.61 - 1.24 mg/dL Final  . Calcium 07/05/2019 9.1  8.9 - 10.3 mg/dL Final  . Total Protein 07/05/2019 8.4* 6.5 - 8.1 g/dL Final  . Albumin 07/05/2019 3.8  3.5 - 5.0 g/dL Final  . AST 07/05/2019 75* 15 - 41 U/L Final  . ALT 07/05/2019 28  0 - 44 U/L Final  . Alkaline Phosphatase 07/05/2019 156* 38 - 126 U/L Final  . Total Bilirubin 07/05/2019 1.2  0.3 - 1.2 mg/dL Final  . GFR calc non Af Amer 07/05/2019 >60  >60 mL/min Final  . GFR calc Af Amer  07/05/2019 >60  >60 mL/min Final  . Anion gap 07/05/2019 12  5 - 15 Final   Performed at [facility], [place] [address] [place], [place] [address]  . Alcohol, Ethyl (B) 07/05/2019 275* <10 mg/dL Final   Comment: (NOTE) Lowest detectable limit for serum alcohol is 10 mg/dL. For medical purposes only. Performed at [facility], [place] [address], [place], [place] [address]   . Salicylate Lvl 30/09/6224 <7.0* 7.0 - 30.0 mg/dL Final   Performed at [place] [address] [place], [place] [address]  . Acetaminophen (Tylenol), Serum 07/05/2019 <10* 10 - 30 ug/mL Final   Comment: (NOTE) Therapeutic concentrations vary significantly. A range of 10-30 ug/mL  may be an effective concentration for many patients. However, some  are best treated at concentrations outside of this range. Acetaminophen concentrations >150 ug/mL at 4 hours after ingestion  and >50 ug/mL at 12 hours after ingestion are often associated with  toxic reactions. Performed at [facility], [place] [address], [place], [place] [address]   . Opiates 07/05/2019 NONE DETECTED  NONE DETECTED Final  . Cocaine 07/05/2019 NONE DETECTED  NONE  DETECTED Final  . Benzodiazepines 07/05/2019 NONE DETECTED  NONE DETECTED Final  . Amphetamines 07/05/2019 NONE DETECTED  NONE DETECTED Final  . Tetrahydrocannabinol 07/05/2019 NONE DETECTED  NONE DETECTED Final  . Barbiturates 07/05/2019 NONE DETECTED  NONE DETECTED Final   Comment: (NOTE) DRUG SCREEN FOR MEDICAL PURPOSES ONLY.  IF CONFIRMATION IS NEEDED FOR ANY PURPOSE, NOTIFY LAB WITHIN 5 DAYS. LOWEST DETECTABLE LIMITS FOR URINE DRUG SCREEN Drug Class                     Cutoff (ng/mL) Amphetamine and metabolites    1000 Barbiturate and metabolites    200 Benzodiazepine                 659 Tricyclics and metabolites     300 Opiates and metabolites        300 Cocaine and metabolites        300 THC                             50 Performed at [facility], [place] [address] [place], [place] [address]   . WBC 07/05/2019 5.1  4.0 - 10.5 K/uL Final  . RBC 07/05/2019 3.91* 4.22 - 5.81 MIL/uL Final  . Hemoglobin 07/05/2019 8.9* 13.0 - 17.0 g/dL Final   Comment: Reticulocyte Hemoglobin testing may be clinically indicated, consider ordering this additional test [id number]   . HCT 07/05/2019 29.3* 39 - 52 % Final  . MCV 07/05/2019 74.9* 80.0 - 100.0 fL Final  . MCH 07/05/2019 22.8* 26.0 - 34.0 pg Final  . MCHC 07/05/2019 30.4  30.0 - 36.0 g/dL Final  . RDW 07/05/2019 23.9* 11.5 - 15.5 % Final  . Platelets 07/05/2019 118* 150 - 400 K/uL Final   Comment: SPECIMEN CHECKED FOR CLOTS Immature Platelet Fraction may be clinically indicated, consider ordering this additional test [id number] PLATELET COUNT CONFIRMED BY SMEAR REPEATED TO VERIFY   . nRBC 07/05/2019 0.0  0.0 - 0.2 % Final  . Neutrophils Relative % 07/05/2019 49  % Final  . Neutro Abs 07/05/2019 2.5  1.7 - 7.7 K/uL Final  . Lymphocytes Relative 07/05/2019 32  % Final  . Lymphs Abs 07/05/2019 1.6  0.7 - 4.0 K/uL Final  . Monocytes Relative 07/05/2019 16  % Final  . Monocytes Absolute 07/05/2019 0.8  0.1 - 1.0 K/uL Final  . Eosinophils Relative 07/05/2019 2  % Final  . Eosinophils Absolute 07/05/2019 0.1  0 - 0 K/uL Final  . Basophils Relative 07/05/2019 1  % Final  . Basophils Absolute 07/05/2019 0.0  0 - 0 K/uL Final  . Immature Granulocytes 07/05/2019 0  % Final  . Abs Immature Granulocytes 07/05/2019 0.02  0.00 - 0.07 K/uL Final   Performed at [facility], [place] [address], [place], [place] [address]  Admission on 07/01/2019, Discharged on 07/04/2019  Component Date Value Ref Range Status  . WBC 07/01/2019 4.3  4.0 - 10.5 K/uL Final  . RBC 07/01/2019 3.35* 4.22 - 5.81 MIL/uL Final  . Hemoglobin 07/01/2019 6.8* 13.0 - 17.0 g/dL Final   Comment: REPEATED TO VERIFY Reticulocyte Hemoglobin testing may  be clinically indicated, consider ordering this additional test [id number] THIS CRITICAL RESULT HAS VERIFIED AND BEEN CALLED TO E HODGES,RN BY JACQUELINE HOLMES ON 04 12 2021 AT 0003, AND HAS BEEN READ BACK. CRITICAL RESULTS VERIFIED   . HCT 07/01/2019 24.1* 39 - 52 % Final  .  MCV 07/01/2019 71.9* 80.0 - 100.0 fL Final  . MCH 07/01/2019 20.3* 26.0 - 34.0 pg Final  . MCHC 07/01/2019 28.2* 30.0 - 36.0 g/dL Final  . RDW 07/01/2019 21.0* 11.5 - 15.5 % Final  . Platelets 07/01/2019 75* 150 - 400 K/uL Final   Comment: Immature Platelet Fraction may be clinically indicated, consider ordering this additional test [id number] PLATELET COUNT CONFIRMED BY SMEAR REPEATED TO VERIFY   . nRBC 07/01/2019 0.0  0.0 - 0.2 % Final  . Neutrophils Relative % 07/01/2019 55  % Final  . Neutro Abs 07/01/2019 2.3  1.7 - 7.7 K/uL Final  . Lymphocytes Relative 07/01/2019 29  % Final  . Lymphs Abs 07/01/2019 1.3  0.7 - 4.0 K/uL Final  . Monocytes Relative 07/01/2019 12  % Final  . Monocytes Absolute 07/01/2019 0.5  0.1 - 1.0 K/uL Final  . Eosinophils Relative 07/01/2019 3  % Final  . Eosinophils Absolute 07/01/2019 0.1  0 - 0 K/uL Final  . Basophils Relative 07/01/2019 1  % Final  . Basophils Absolute 07/01/2019 0.1  0 - 0 K/uL Final  . Immature Granulocytes 07/01/2019 0  % Final  . Abs Immature Granulocytes 07/01/2019 0.01  0.00 - 0.07 K/uL Final   Performed at [facility], [place] [address], [place], [place] [address]  . Sodium 07/01/2019 136  135 - 145 mmol/L Final  . Potassium 07/01/2019 3.8  3.5 - 5.1 mmol/L Final  . Chloride 07/01/2019 102  98 - 111 mmol/L Final  . CO2 07/01/2019 25  22 - 32 mmol/L Final  . Glucose, Bld 07/01/2019 100* 70 - 99 mg/dL Final   Glucose reference range applies only to samples taken after fasting for at least 8 hours.  . BUN 07/01/2019 <5* 6 - 20 mg/dL Final  . Creatinine, Ser 07/01/2019 0.60* 0.61 - 1.24 mg/dL Final  . Calcium 07/01/2019 8.6* 8.9 - 10.3 mg/dL  Final  . GFR calc non Af Amer 07/01/2019 >60  >60 mL/min Final  . GFR calc Af Amer 07/01/2019 >60  >60 mL/min Final  . Anion gap 07/01/2019 9  5 - 15 Final   Performed at [facility], [place] [address] [place], [place] [address]  . Total Protein 07/01/2019 8.2* 6.5 - 8.1 g/dL Final  . Albumin 07/01/2019 3.7  3.5 - 5.0 g/dL Final  . AST 07/01/2019 118* 15 - 41 U/L Final  . ALT 07/01/2019 32  0 - 44 U/L Final  . Alkaline Phosphatase 07/01/2019 141* 38 - 126 U/L Final  . Total Bilirubin 07/01/2019 0.7  0.3 - 1.2 mg/dL Final  . Bilirubin, Direct 07/01/2019 0.3* 0.0 - 0.2 mg/dL Final  . Indirect Bilirubin 07/01/2019 0.4  0.3 - 0.9 mg/dL Final   Performed at [facility], [place] [address], [place], [place] [address]  . Prothrombin Time 07/01/2019 14.3  11.4 - 15.2 seconds Final  . INR 07/01/2019 1.1  0.8 - 1.2 Final   Comment: (NOTE) INR goal varies based on device and disease states. Performed at [facility], [place] [address], [place], [place] [address]   . Lipase 07/01/2019 50  11 - 51 U/L Final   Performed at [facility], [place] [address], [place], [place] [address]  . Ammonia 07/01/2019 29  9 - 35 umol/L Final   Performed at [facility], [place] [address], [place], [place] [address]  . Alcohol, Ethyl (B) 07/01/2019 295* <10 mg/dL Final   Comment: (  NOTE) Lowest detectable limit for serum alcohol is 10 mg/dL. For medical purposes only. Performed at [facility], [place] [address], [place], [place] [address]   . Sodium 07/01/2019 137  135 - 145 mmol/L Final  . Potassium 07/01/2019 3.8  3.5 - 5.1 mmol/L Final  . Chloride 07/01/2019 101  98 - 111 mmol/L Final  . BUN 07/01/2019 <3* 6 - 20 mg/dL Final  . Creatinine, Ser 07/01/2019 1.00  0.61 - 1.24 mg/dL Final  . Glucose, Bld 07/01/2019 93  70 - 99 mg/dL Final   Glucose reference range applies only to samples taken after fasting for at  least 8 hours.  . Calcium, Ion 07/01/2019 1.02* 1.15 - 1.40 mmol/L Final  . TCO2 07/01/2019 27  22 - 32 mmol/L Final  . Hemoglobin 07/01/2019 8.8* 13.0 - 17.0 g/dL Final  . HCT 07/01/2019 26.0* 39 - 52 % Final  . ABO/RH(D) 07/02/2019 O POS   Final  . Antibody Screen 07/02/2019 NEG   Final  . Sample Expiration 07/02/2019 07/05/2019,2359   Final  . Unit Number 07/02/2019 [id number]   Final  . Blood Component Type 07/02/2019 RBC LR PHER2   Final  . Unit division 07/02/2019 00   Final  . Status of Unit 07/02/2019 ISSUED,FINAL   Final  . Transfusion Status 07/02/2019 OK TO TRANSFUSE   Final  . Crossmatch Result 07/02/2019 Compatible   Final  . Unit Number 07/02/2019 [id number]   Final  . Blood Component Type 07/02/2019 RED CELLS,LR   Final  . Unit division 07/02/2019 00   Final  . Status of Unit 07/02/2019 ISSUED,FINAL   Final  . Transfusion Status 07/02/2019 OK TO TRANSFUSE   Final  . Crossmatch Result 07/02/2019    Final                   Value:Compatible Performed at [facility], [place] [address], [place], [place] [address]   . Order Confirmation 07/02/2019    Final                   Value:ORDER PROCESSED BY BLOOD BANK Performed at [facility], [place] [address], [place], [place] [address]   . Fecal Occult Bld 07/02/2019 POSITIVE* NEGATIVE Final  . SARS Coronavirus 2 by RT PCR 07/02/2019 NEGATIVE  NEGATIVE Final   Comment: (NOTE) SARS-CoV-2 target nucleic acids are NOT DETECTED. The SARS-CoV-2 RNA is generally detectable in upper respiratoy specimens during the acute phase of infection. The lowest concentration of SARS-CoV-2 viral copies this assay can detect is 131 copies/mL. A negative result does not preclude SARS-Cov-2 infection and should not be used as the sole basis for treatment or other patient management decisions. A negative result may occur with  improper specimen collection/handling, submission of specimen other than  nasopharyngeal swab, presence of viral mutation(s) within the areas targeted by this assay, and inadequate number of viral copies (<131 copies/mL). A negative result must be combined with clinical observations, patient history, and epidemiological information. The expected result is Negative. Fact Sheet for Patients:  PinkCheek.be Fact Sheet for Healthcare Providers:  GravelBags.it This test is not yet ap                          proved or cleared by the [place] FDA and  has been authorized for detection and/or diagnosis of SARS-CoV-2 by FDA under an Emergency Use Authorization (EUA). This EUA will remain  in  effect (meaning this test can be used) for the duration of the COVID-19 declaration under Section 564(b)(1) of the Act, 21 U.S.C. section 360bbb-3(b)(1), unless the authorization is terminated or revoked sooner.   . Influenza A by PCR 07/02/2019 NEGATIVE  NEGATIVE Final  . Influenza B by PCR 07/02/2019 NEGATIVE  NEGATIVE Final   Comment: (NOTE) The Xpert Xpress SARS-CoV-2/FLU/RSV assay is intended as an aid in  the diagnosis of influenza from Nasopharyngeal swab specimens and  should not be used as a sole basis for treatment. Nasal washings and  aspirates are unacceptable for Xpert Xpress SARS-CoV-2/FLU/RSV  testing. Fact Sheet for Patients: PinkCheek.be Fact Sheet for Healthcare Providers: GravelBags.it This test is not yet approved or cleared by the [place] FDA and  has been authorized for detection and/or diagnosis of SARS-CoV-2 by  FDA under an Emergency Use Authorization (EUA). This EUA will remain  in effect (meaning this test can be used) for the duration of the  Covid-19 declaration under Section 564(b)(1) of the Act, 21  U.S.C. section 360bbb-3(b)(1), unless the authorization is  terminated or revoked. Performed at [facility], [place] [address], [place], [place] [address]   . ISSUE DATE / TIME 07/02/2019 [id number]   Final  . Blood Product Unit Number 07/02/2019 [id number]   Final  . PRODUCT CODE 07/02/2019 [id number]   Final  . Unit Type and Rh 07/02/2019 5100   Final  . Blood Product Expiration Date 07/02/2019 [id number]   Final  . ISSUE DATE / TIME 07/02/2019 [id number]   Final  . Blood Product Unit Number 07/02/2019 [id number]   Final  . PRODUCT CODE 07/02/2019 [id number]   Final  . Unit Type and Rh 07/02/2019 5100   Final  . Blood Product Expiration Date 07/02/2019 [id number]   Final  . Magnesium 07/02/2019 1.8  1.7 - 2.4 mg/dL Final   Performed at [facility], [place] [address], [place], [place] [address]  . Phosphorus 07/02/2019 3.4  2.5 - 4.6 mg/dL Final   Performed at [place] [address], [place], [place] [address]  . Sodium 07/02/2019 134* 135 - 145 mmol/L Final  . Potassium 07/02/2019 4.2  3.5 - 5.1 mmol/L Final  . Chloride 07/02/2019 99  98 - 111 mmol/L Final  . CO2 07/02/2019 25  22 - 32 mmol/L Final  . Glucose, Bld 07/02/2019 93  70 - 99 mg/dL Final   Glucose reference range applies only to samples taken after fasting for at least 8 hours.  . BUN 07/02/2019 6  6 - 20 mg/dL Final  . Creatinine, Ser 07/02/2019 0.73  0.61 - 1.24 mg/dL Final  . Calcium 07/02/2019 8.3* 8.9 - 10.3 mg/dL Final  . Total Protein 07/02/2019 8.0  6.5 - 8.1 g/dL Final  . Albumin 07/02/2019 3.5  3.5 - 5.0 g/dL Final  . AST 07/02/2019 91* 15 - 41 U/L Final  . ALT 07/02/2019 29  0 - 44 U/L Final  . Alkaline Phosphatase 07/02/2019 138* 38 - 126 U/L Final  . Total Bilirubin 07/02/2019 1.6* 0.3 - 1.2 mg/dL Final  . GFR calc non Af Amer 07/02/2019 >60  >60 mL/min Final  . GFR calc Af Amer 07/02/2019 >60  >60 mL/min Final  . Anion gap 07/02/2019 10  5 - 15 Final   Performed at [facility], [place] [address], [place], [place] [address]  . WBC  07/02/2019 5.4  4.0 - 10.5 K/uL Final  . RBC 07/02/2019 3.91*  4.22 - 5.81 MIL/uL Final  . Hemoglobin 07/02/2019 8.8* 13.0 - 17.0 g/dL Final   Comment: REPEATED TO VERIFY POST TRANSFUSION SPECIMEN   . HCT 07/02/2019 29.2* 39 - 52 % Final  . MCV 07/02/2019 74.7* 80.0 - 100.0 fL Final  . MCH 07/02/2019 22.5* 26.0 - 34.0 pg Final  . MCHC 07/02/2019 30.1  30.0 - 36.0 g/dL Final  . RDW 07/02/2019 22.5* 11.5 - 15.5 % Final  . Platelets 07/02/2019 69* 150 - 400 K/uL Final   Comment: SPECIMEN CHECKED FOR CLOTS Immature Platelet Fraction may be clinically indicated, consider ordering this additional test [id number] PLATELET COUNT CONFIRMED BY SMEAR REPEATED TO VERIFY   . nRBC 07/02/2019 0.0  0.0 - 0.2 % Final  . Neutrophils Relative % 07/02/2019 62  % Final  . Neutro Abs 07/02/2019 3.3  1.7 - 7.7 K/uL Final  . Lymphocytes Relative 07/02/2019 20  % Final  . Lymphs Abs 07/02/2019 1.1  0.7 - 4.0 K/uL Final  . Monocytes Relative 07/02/2019 15  % Final  . Monocytes Absolute 07/02/2019 0.8  0.1 - 1.0 K/uL Final  . Eosinophils Relative 07/02/2019 2  % Final  . Eosinophils Absolute 07/02/2019 0.1  0 - 0 K/uL Final  . Basophils Relative 07/02/2019 1  % Final  . Basophils Absolute 07/02/2019 0.1  0 - 0 K/uL Final  . Immature Granulocytes 07/02/2019 0  % Final  . Abs Immature Granulocytes 07/02/2019 0.01  0.00 - 0.07 K/uL Final  . Polychromasia 07/02/2019 PRESENT   Final  . Target Cells 07/02/2019 PRESENT   Final  . Ovalocytes 07/02/2019 PRESENT   Final   Performed at [facility], [place] [address] [place], [place] [address]  . WBC 07/03/2019 4.7  4.0 - 10.5 K/uL Final  . RBC 07/03/2019 3.97* 4.22 - 5.81 MIL/uL Final  . Hemoglobin 07/03/2019 8.9* 13.0 - 17.0 g/dL Final   Comment: Reticulocyte Hemoglobin testing may be clinically indicated, consider ordering this additional test [id number]   . HCT 07/03/2019 29.4* 39 - 52 % Final  . MCV 07/03/2019 74.1* 80.0 - 100.0 fL Final  .  MCH 07/03/2019 22.4* 26.0 - 34.0 pg Final  . MCHC 07/03/2019 30.3  30.0 - 36.0 g/dL Final  . RDW 07/03/2019 22.6* 11.5 - 15.5 % Final  . Platelets 07/03/2019 69* 150 - 400 K/uL Final   Comment: Immature Platelet Fraction may be clinically indicated, consider ordering this additional test [id number]   . nRBC 07/03/2019 0.0  0.0 - 0.2 % Final  . Neutrophils Relative % 07/03/2019 59  % Final  . Neutro Abs 07/03/2019 2.8  1.7 - 7.7 K/uL Final  . Lymphocytes Relative 07/03/2019 22  % Final  . Lymphs Abs 07/03/2019 1.0  0.7 - 4.0 K/uL Final  . Monocytes Relative 07/03/2019 16  % Final  . Monocytes Absolute 07/03/2019 0.7  0.1 - 1.0 K/uL Final  . Eosinophils Relative 07/03/2019 2  % Final  . Eosinophils Absolute 07/03/2019 0.1  0 - 0 K/uL Final  . Basophils Relative 07/03/2019 1  % Final  . Basophils Absolute 07/03/2019 0.0  0 - 0 K/uL Final  . Immature Granulocytes 07/03/2019 0  % Final  . Abs Immature Granulocytes 07/03/2019 0.02  0.00 - 0.07 K/uL Final  . Target Cells 07/03/2019 PRESENT   Final   Performed at [facility], [place] [address], [place], [place] [address]  . Sodium 07/03/2019 132* 135 - 145 mmol/L Final  . Potassium 07/03/2019 3.4* 3.5 -  5.1 mmol/L Final  . Chloride 07/03/2019 95* 98 - 111 mmol/L Final  . CO2 07/03/2019 24  22 - 32 mmol/L Final  . Glucose, Bld 07/03/2019 113* 70 - 99 mg/dL Final   Glucose reference range applies only to samples taken after fasting for at least 8 hours.  . BUN 07/03/2019 8  6 - 20 mg/dL Final  . Creatinine, Ser 07/03/2019 0.64  0.61 - 1.24 mg/dL Final  . Calcium 07/03/2019 8.6* 8.9 - 10.3 mg/dL Final  . GFR calc non Af Amer 07/03/2019 >60  >60 mL/min Final  . GFR calc Af Amer 07/03/2019 >60  >60 mL/min Final  . Anion gap 07/03/2019 13  5 - 15 Final   Performed at [facility], [place] [address] [place], [place] [address]  . Hemoglobin 07/04/2019 9.4* 13.0 - 17.0 g/dL Final  . HCT 07/04/2019 30.7* 39 - 52 %  Final   Performed at [facility], [place] [address], [place], [place] [address]    Allergies: Tomato, Aspirin, and Sulfa antibiotics  PTA Medications: (Not in a hospital admission)   Medical Decision Making  Based on my evaluation, the patient meets criteria for admission to [facility] for continuous monitoring. Admission labs have been ordered and initiated. Ativan Detox protocol has also been ordered for the patient.    Recommendations  Based on my evaluation the patient does not appear to have an emergency medical condition.  Malachy Mood, PA 12/28/19  5:25 AM

## 2019-12-28 NOTE — ED Notes (Signed)
Attempted to give TCU report
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
## 2019-12-28 NOTE — ED Provider Notes (Signed)
FBC/OBS ASAP Discharge Summary  Date and Time: 12/28/2019 9:53 AM  Name: Barry Moore  MRN:  [id number]   Discharge Diagnoses:  Final diagnoses:  Alcohol-induced mood disorder ([place])    Subjective: "I feel sick."   Stay Summary: The patient was seen and examined face to face. He is alert and oriented x 4. He is calm and cooperative and participates during the assessment. He reports passive suicidal ideations with no plan or intent. He verbally contracts for safety. He stated, "I am not suicidal when I'm in places like this." He reported feeling safe to discharge to a residential treatment facility today. He denies homicidal thoughts. He reports auditory hallucinations, "hearing someone call my name when no one is there." He denies visual hallucinations. He does not appear to be responding to internal, or external stimuli. He reports withdrawal symptoms of shaking, anxiety, irritability and agitation. He was administered Ativan for his withdrawal symptoms. He is requesting residential treatment to get off alcohol.  We discussed treatment options to include residential treatment options, outpatient services and rescue missions. Consulted with Jolan, LCSW., and the patient is eligible for [facility] in [place]. The patient agrees to attend [facility] for substance abuse treatment. The patient will be transported via [facility] to [facility] in [place]. Labs reviewed. Vital signs reviewed. Medications reviewed.    Total Time spent with patient: 30 minutes  Past Psychiatric History: Alcohol and Depression. Past Medical History:  Past Medical History:  Diagnosis Date  . Alcoholic hepatitis   . Depression   . ETOH abuse   . Gastric bleed 08/2018  . Hypertension   . Pancreatitis   . Seizures ([place])   . Suicidal behavior     Past Surgical History:  Procedure Laterality Date  . BIOPSY  08/05/2018   Procedure: BIOPSY;  Surgeon: Rush Landmark Telford Nab., MD;  Location: [place];   Service: Gastroenterology;;  . BIOPSY  08/07/2018   Procedure: BIOPSY;  Surgeon: Thornton Park, MD;  Location[facility] ENDOSCOPY;  Service: Gastroenterology;;  . BIOPSY  01/02/2019   Procedure: BIOPSY;  Surgeon: Gatha Mayer, MD;  Location: [facility] ENDOSCOPY;  Service: Endoscopy;;  . COLONOSCOPY WITH PROPOFOL N/A 08/07/2018   Procedure: COLONOSCOPY WITH PROPOFOL;  Surgeon: Thornton Park, MD;  Location: [place];  Service: Gastroenterology;  Laterality: N/A;  . ESOPHAGOGASTRODUODENOSCOPY N/A 01/02/2019   Procedure: ESOPHAGOGASTRODUODENOSCOPY (EGD);  Surgeon: Gatha Mayer, MD;  Location: [facility];  Service: Endoscopy;  Laterality: N/A;  . ESOPHAGOGASTRODUODENOSCOPY N/A 07/03/2019   Procedure: ESOPHAGOGASTRODUODENOSCOPY (EGD);  Surgeon: Wonda Horner, MD;  Location: [facility];  Service: Endoscopy;  Laterality: N/A;  . ESOPHAGOGASTRODUODENOSCOPY (EGD) WITH PROPOFOL N/A 11/02/2016   Procedure: ESOPHAGOGASTRODUODENOSCOPY (EGD) WITH PROPOFOL;  Surgeon: Carol Ada, MD;  Location: [facility] ENDOSCOPY;  Service: Endoscopy;  Laterality: N/A;  . ESOPHAGOGASTRODUODENOSCOPY (EGD) WITH PROPOFOL N/A 08/05/2018   Procedure: ESOPHAGOGASTRODUODENOSCOPY (EGD) WITH PROPOFOL;  Surgeon: Rush Landmark Telford Nab., MD;  Location: [place];  Service: Gastroenterology;  Laterality: N/A;  . ESOPHAGOGASTRODUODENOSCOPY (EGD) WITH PROPOFOL N/A 07/14/2019   Procedure: ESOPHAGOGASTRODUODENOSCOPY (EGD) WITH PROPOFOL;  Surgeon: Otis Brace, MD;  Location: [facility] ENDOSCOPY;  Service: Gastroenterology;  Laterality: N/A;  . HERNIA REPAIR    . LEG SURGERY    . POLYPECTOMY  08/07/2018   Procedure: POLYPECTOMY;  Surgeon: Thornton Park, MD;  Location: [facility] ENDOSCOPY;  Service: Gastroenterology;;   Family History:  Family History  Problem Relation Age of Onset  . Diabetes Mother   . Alcoholism Mother   . Emphysema Father   .  Lung cancer Father   . Alcoholism Father    Family Psychiatric History: See above Social  History:  Social History   Substance and Sexual Activity  Alcohol Use Yes   Comment: 5+ BEERS DAILY, Patient does not know how much he drank tonight     Social History   Substance and Sexual Activity  Drug Use Not Currently  . Frequency: 3.0 times per week  . Types: Cocaine    Social History   Socioeconomic History  . Marital status: Divorced    Spouse name: Not on file  . Number of children: Not on file  . Years of education: Not on file  . Highest education level: Not on file  Occupational History  . Not on file  Tobacco Use  . Smoking status: Current Every Day Smoker    Packs/day: 1.00    Types: Cigarettes  . Smokeless tobacco: Never Used  Vaping Use  . Vaping Use: Never used  Substance and Sexual Activity  . Alcohol use: Yes    Comment: 5+ BEERS DAILY, Patient does not know how much he drank tonight  . Drug use: Not Currently    Frequency: 3.0 times per week    Types: Cocaine  . Sexual activity: Not Currently  Other Topics Concern  . Not on file  Social History Narrative  . Not on file   Social Determinants of Health   Financial Resource Strain:   . Difficulty of Paying Living Expenses: Not on file  Food Insecurity:   . Worried About Charity fundraiser in the Last Year: Not on file  . Ran Out of Food in the Last Year: Not on file  Transportation Needs:   . Lack of Transportation (Medical): Not on file  . Lack of Transportation (Non-Medical): Not on file  Physical Activity:   . Days of Exercise per Week: Not on file  . Minutes of Exercise per Session: Not on file  Stress:   . Feeling of Stress : Not on file  Social Connections:   . Frequency of Communication with Friends and Family: Not on file  . Frequency of Social Gatherings with Friends and Family: Not on file  . Attends Religious Services: Not on file  . Active Member of Clubs or Organizations: Not on file  . Attends Archivist Meetings: Not on file  . Marital Status: Not on file    SDOH:  SDOH Screenings   Alcohol Screen:   . Last Alcohol Screening Score (AUDIT): Not on file  Depression (PHQ2-9): Medium Risk  . PHQ-2 Score: 11  Financial Resource Strain:   . Difficulty of Paying Living Expenses: Not on file  Food Insecurity:   . Worried About Charity fundraiser in the Last Year: Not on file  . Ran Out of Food in the Last Year: Not on file  Housing:   . Last Housing Risk Score: Not on file  Physical Activity:   . Days of Exercise per Week: Not on file  . Minutes of Exercise per Session: Not on file  Social Connections:   . Frequency of Communication with Friends and Family: Not on file  . Frequency of Social Gatherings with Friends and Family: Not on file  . Attends Religious Services: Not on file  . Active Member of Clubs or Organizations: Not on file  . Attends Archivist Meetings: Not on file  . Marital Status: Not on file  Stress:   . Feeling of Stress :  Not on file  Tobacco Use: High Risk  . Smoking Tobacco Use: Current Every Day Smoker  . Smokeless Tobacco Use: Never Used  Transportation Needs:   . Film/video editor (Medical): Not on file  . Lack of Transportation (Non-Medical): Not on file    Has this patient used any form of tobacco in the last 30 days? (Cigarettes, Smokeless Tobacco, Cigars, and/or Pipes) A prescription for an FDA-approved tobacco cessation medication was offered at discharge and the patient refused  Current Medications:  Current Facility-Administered Medications  Medication Dose Route Frequency Provider Last Rate Last Admin  . acetaminophen (TYLENOL) tablet 650 mg  650 mg Oral Q6H PRN Nwoko, Uchenna E, PA      . alum & mag hydroxide-simeth (MAALOX/MYLANTA) 200-200-20 MG/5ML suspension 30 mL  30 mL Oral Q4H PRN Nwoko, Uchenna E, PA      . hydrOXYzine (ATARAX/VISTARIL) tablet 25 mg  25 mg Oral TID PRN Nwoko, Uchenna E, PA      . loperamide (IMODIUM) capsule 2-4 mg  2-4 mg Oral PRN Nwoko, Uchenna E, PA      .  LORazepam (ATIVAN) tablet 1 mg  1 mg Oral Q6H PRN Nwoko, Uchenna E, PA      . LORazepam (ATIVAN) tablet 1 mg  1 mg Oral QID Esmond Hinch, Lowry Ram, FNP       Followed by  . [START ON 12/29/2019] LORazepam (ATIVAN) tablet 1 mg  1 mg Oral TID Kacia Halley, Lowry Ram, FNP       Followed by  . [START ON 12/30/2019] LORazepam (ATIVAN) tablet 1 mg  1 mg Oral BID Vonda Harth, Lowry Ram, FNP       Followed by  . [START ON 12/31/2019] LORazepam (ATIVAN) tablet 1 mg  1 mg Oral Daily Jeyson Deshotel B, FNP      . magnesium hydroxide (MILK OF MAGNESIA) suspension 30 mL  30 mL Oral Daily PRN Nwoko, Uchenna E, PA      . multivitamin with minerals tablet 1 tablet  1 tablet Oral Daily Nwoko, Uchenna E, PA   1 tablet at 12/28/19 0858  . ondansetron (ZOFRAN-ODT) disintegrating tablet 4 mg  4 mg Oral Q6H PRN Nwoko, Uchenna E, PA      . [START ON 12/29/2019] thiamine tablet 100 mg  100 mg Oral Daily Nwoko, Uchenna E, PA      . traZODone (DESYREL) tablet 50 mg  50 mg Oral QHS PRN Nwoko, Uchenna E, PA       Current Outpatient Medications  Medication Sig Dispense Refill  . amLODipine (NORVASC) 5 MG tablet Take 1 tablet (5 mg total) by mouth daily. (Patient not taking: Reported on 12/13/2019) 30 tablet 0  . folic acid (FOLVITE) 1 MG tablet Take 1 tablet (1 mg total) by mouth daily. (Patient not taking: Reported on 12/13/2019)    . Multiple Vitamin (MULTIVITAMIN WITH MINERALS) TABS tablet Take 1 tablet by mouth daily. (Patient not taking: Reported on 12/13/2019)    . nicotine (NICODERM CQ - DOSED IN MG/24 HOURS) 21 mg/24hr patch Place 1 patch (21 mg total) onto the skin daily. (Patient not taking: Reported on 12/13/2019) 28 patch 0  . pantoprazole (PROTONIX) 40 MG tablet Take 1 tablet (40 mg total) by mouth 2 (two) times daily. (Patient not taking: Reported on 12/13/2019) 60 tablet 0  . thiamine 100 MG tablet Take 1 tablet (100 mg total) by mouth daily. (Patient not taking: Reported on 12/13/2019)      PTA Medications: (Not in a hospital  admission)   Musculoskeletal  Strength & Muscle Tone: within normal limits Gait & Station: normal Patient leans: N/A  Psychiatric Specialty Exam  Presentation  General Appearance: Disheveled  Eye Contact:Fair  Speech:Clear and Coherent  Speech Volume:Decreased  Handedness:Right   Mood and Affect  Mood:Anxious  Affect:Congruent   Thought Process  Thought Processes:Coherent  Descriptions of Associations:Intact  Orientation:Full (Time, Place and Person)  Thought Content:WDL  Hallucinations:Hallucinations: None  Ideas of Reference:None  Suicidal Thoughts:Suicidal Thoughts: Yes, Passive SI Active Intent and/or Plan: Without Intent;Without Plan  Homicidal Thoughts:Homicidal Thoughts: No   Sensorium  Memory:Immediate Fair;Recent Fair;Remote Fair  Judgment:Intact  Insight:Fair   Executive Functions  Concentration:Fair  Attention Span:Fair  [place] of Knowledge:Good  Language:Fair   Psychomotor Activity  Psychomotor Activity:Psychomotor Activity: Normal   Assets  Assets:Communication Skills;Desire for Improvement   Sleep  Sleep:Sleep: Fair   Physical Exam  Physical Exam Vitals and nursing note reviewed.  Constitutional:      Appearance: He is well-developed.  HENT:     Head: Normocephalic.  Eyes:     Pupils: Pupils are equal, round, and reactive to light.  Cardiovascular:     Rate and Rhythm: Normal rate.  Pulmonary:     Effort: Pulmonary effort is normal.  Musculoskeletal:        General: Normal range of motion.  Neurological:     Mental Status: He is alert and oriented to person, place, and time.    Review of Systems  Constitutional: Negative.   HENT: Negative.   Eyes: Negative.   Respiratory: Negative.   Cardiovascular: Negative.   Gastrointestinal: Negative.   Genitourinary: Negative.   Musculoskeletal: Negative.   Skin: Negative.   Neurological: Negative.   Endo/Heme/Allergies: Negative.    Psychiatric/Behavioral: Positive for hallucinations, substance abuse and suicidal ideas.   Blood pressure (!) 149/85, pulse 85, temperature 97.8 F (36.6 C), temperature source Temporal, resp. rate 18, SpO2 100 %. There is no height or weight on file to calculate BMI.  Demographic Factors:  Male, Caucasian, Low socioeconomic status and Unemployed  Loss Factors: Financial problems/change in socioeconomic status  Historical Factors: Family history of mental illness or substance abuse  Risk Reduction Factors:   Living with another person, especially a relative  Continued Clinical Symptoms:  Alcohol/Substance Abuse/Dependencies  Cognitive Features That Contribute To Risk:  None    Suicide Risk:  Minimal: No identifiable suicidal ideation.  Patients presenting with no risk factors but with morbid ruminations; may be classified as minimal risk based on the severity of the depressive symptoms  Plan Of Care/Follow-up recommendations:  Continue activity as tolerated. Continue diet as recommended by your PCP. Ensure to keep all appointments with outpatient providers.  Disposition: Follow up with [facility] in [place] for substance abuse treatment. Patient will be transported via [facility].  Mazie, FNP 12/28/2019, 9:53 AM

## 2019-12-28 NOTE — Discharge Instructions (Signed)

## 2020-02-11 ENCOUNTER — Emergency Department (HOSPITAL_COMMUNITY)
Admission: EM | Admit: 2020-02-11 | Discharge: 2020-02-12 | Disposition: A | Discharge status: Home / Self Care | Payer: Self-pay | Attending: Emergency Medicine | Admitting: Emergency Medicine

## 2020-02-11 ENCOUNTER — Encounter (HOSPITAL_COMMUNITY): Payer: Self-pay | Admitting: Emergency Medicine

## 2020-02-11 ENCOUNTER — Other Ambulatory Visit: Payer: Self-pay

## 2020-02-11 ENCOUNTER — Emergency Department (HOSPITAL_COMMUNITY): Payer: Self-pay

## 2020-02-11 DIAGNOSIS — F17210 Nicotine dependence, cigarettes, uncomplicated: Secondary | ICD-10-CM | POA: Insufficient documentation

## 2020-02-11 DIAGNOSIS — Z20822 Contact with and (suspected) exposure to covid-19: Secondary | ICD-10-CM | POA: Insufficient documentation

## 2020-02-11 DIAGNOSIS — R000 Tachycardia, unspecified: Secondary | ICD-10-CM | POA: Insufficient documentation

## 2020-02-11 DIAGNOSIS — R072 Precordial pain: Secondary | ICD-10-CM | POA: Insufficient documentation

## 2020-02-11 DIAGNOSIS — I10 Essential (primary) hypertension: Secondary | ICD-10-CM | POA: Insufficient documentation

## 2020-02-11 LAB — CBC
HCT: 29.7 % — ABNORMAL LOW (ref 39.0–52.0)
Hemoglobin: 8.8 g/dL — ABNORMAL LOW (ref 13.0–17.0)
MCH: 21.6 pg — ABNORMAL LOW (ref 26.0–34.0)
MCHC: 29.6 g/dL — ABNORMAL LOW (ref 30.0–36.0)
MCV: 72.8 fL — ABNORMAL LOW (ref 80.0–100.0)
Platelets: 107 10*3/uL — ABNORMAL LOW (ref 150–400)
RBC: 4.08 MIL/uL — ABNORMAL LOW (ref 4.22–5.81)
RDW: 18.6 % — ABNORMAL HIGH (ref 11.5–15.5)
WBC: 7.7 10*3/uL (ref 4.0–10.5)
nRBC: 0 % (ref 0.0–0.2)

## 2020-02-11 LAB — BASIC METABOLIC PANEL
Anion gap: 12 (ref 5–15)
BUN: 9 mg/dL (ref 6–20)
CO2: 24 mmol/L (ref 22–32)
Calcium: 8.9 mg/dL (ref 8.9–10.3)
Chloride: 100 mmol/L (ref 98–111)
Creatinine, Ser: 0.8 mg/dL (ref 0.61–1.24)
GFR, Estimated: >60 mL/min (ref >60)
Glucose, Bld: 125 mg/dL — ABNORMAL HIGH (ref 70–99)
Potassium: 3.9 mmol/L (ref 3.5–5.1)
Sodium: 136 mmol/L (ref 135–145)

## 2020-02-11 LAB — TROPONIN I (HIGH SENSITIVITY): Troponin I (High Sensitivity): 3 ng/L (ref <18)

## 2020-02-11 LAB — PROTIME-INR
INR: 1 (ref 0.8–1.2)
Prothrombin Time: 12.3 seconds (ref 11.4–15.2)

## 2020-02-11 NOTE — ED Triage Notes (Signed)
Patient arrived with EMS from street ( homeless) +ETOH , reports central chest pain this evening with mild SOB , no emesis or diaphoresis , denies cough or fever . He received ASA 324 mg by EMS .

## 2020-02-12 ENCOUNTER — Emergency Department (HOSPITAL_COMMUNITY): Payer: Self-pay

## 2020-02-12 LAB — RESPIRATORY PANEL BY RT PCR (FLU A&B, COVID)
Influenza A by PCR: NEGATIVE
Influenza B by PCR: NEGATIVE
SARS Coronavirus 2 by RT PCR: NEGATIVE

## 2020-02-12 LAB — TROPONIN I (HIGH SENSITIVITY): Troponin I (High Sensitivity): 3 ng/L (ref <18)

## 2020-02-12 MED ORDER — LORAZEPAM 2 MG/ML IJ SOLN: 0.0000 mg | Freq: Two times a day (BID) | INTRAMUSCULAR | Status: DC

## 2020-02-12 MED ORDER — IOHEXOL 350 MG/ML SOLN
100.0000 mL | Freq: Once | INTRAVENOUS | Status: AC | PRN
  Administered 2020-02-12: 100 mL via INTRAVENOUS

## 2020-02-12 MED ORDER — THIAMINE HCL 100 MG/ML IJ SOLN: 100.0000 mg | Freq: Every day | INTRAMUSCULAR | Status: DC

## 2020-02-12 MED ORDER — LORAZEPAM 1 MG PO TABS: 0.0000 mg | ORAL_TABLET | Freq: Two times a day (BID) | ORAL | Status: DC

## 2020-02-12 MED ORDER — MORPHINE SULFATE (PF) 4 MG/ML IV SOLN
4.0000 mg | Freq: Once | INTRAVENOUS | Status: AC
  Administered 2020-02-12: 4 mg via INTRAVENOUS
  Filled 2020-02-12: qty 1

## 2020-02-12 MED ORDER — THIAMINE HCL 100 MG PO TABS: 100.0000 mg | ORAL_TABLET | Freq: Every day | ORAL | Status: DC

## 2020-02-12 MED ORDER — ONDANSETRON HCL 4 MG/2ML IJ SOLN
4.0000 mg | Freq: Once | INTRAMUSCULAR | Status: AC
  Administered 2020-02-12: 4 mg via INTRAVENOUS
  Filled 2020-02-12: qty 2

## 2020-02-12 MED ORDER — LORAZEPAM 2 MG/ML IJ SOLN
0.0000 mg | Freq: Four times a day (QID) | INTRAMUSCULAR | Status: DC
  Administered 2020-02-12: 2 mg via INTRAVENOUS
  Filled 2020-02-12: qty 1

## 2020-02-12 MED ORDER — LORAZEPAM 1 MG PO TABS: 0.0000 mg | ORAL_TABLET | Freq: Four times a day (QID) | ORAL | Status: DC

## 2020-02-12 NOTE — ED Provider Notes (Signed)
[facility] EMERGENCY DEPARTMENT Provider Note   CSN: [id number] Arrival date & time: 02/11/20  2139     History Chief Complaint  Patient presents with  . Chest Pain    Barry Moore is a 54 y.o. male.  Patient presents to the emergency department with a chief complaint of right-sided chest pain.  He states the pain began suddenly earlier today.  He has never experienced this before.  He denies any associated shortness of breath, cough, or fever.  He reports drinking alcohol today, but denies any drug use today.  Denies any treatments prior to arrival.  He states that his symptoms are worsened with movement.  The history is provided by the patient. No language interpreter was used.       Past Medical History:  Diagnosis Date  . Alcoholic hepatitis   . Depression   . ETOH abuse   . Gastric bleed 08/2018  . Hypertension   . Pancreatitis   . Seizures ([place])   . Suicidal behavior     Patient Active Problem List   Diagnosis Date Noted  . Nicotine dependence, cigarettes, uncomplicated 76/73/4193  . Alcohol abuse with intoxication ([place]) 07/19/2019  . Orthostatic syncope 07/19/2019  . Upper GI bleeding 07/19/2019  . Acute upper GI bleeding 07/02/2019  . Duodenitis   . Acute upper GI bleed 01/01/2019  . Hypokalemia 01/01/2019  . Alcohol dependence ([place]) 01/01/2019  . Alcoholic liver disease ([place]) 01/01/2019  . Acute pancreatitis 12/13/2018  . Free intraperitoneal air 12/13/2018  . Homicidal ideation   . Gastrointestinal hemorrhage with melena 08/31/2018  . Colon ulcer   . Polyp of ascending colon   . Acute GI bleeding 08/05/2018  . Transaminitis 08/05/2018  . Abdominal pain 08/05/2018  . Nausea and vomiting 08/05/2018  . Substance induced mood disorder ([place]) 07/16/2017  . Severe recurrent major depression without psychotic features ([place]) 05/15/2017  . Microcytic anemia 03/18/2017  . Acute blood loss anemia 02/21/2017  . Upper GI bleed 02/08/2017    . Alcohol withdrawal ([place]) 02/08/2017  . Cocaine abuse ([place]) 11/18/2016  . GI bleed 11/01/2016  . Alcohol abuse   . Major depressive disorder, recurrent severe without psychotic features ([place]) 07/06/2016  . 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 Seizure ([place]) 05/26/2016  . Tobacco abuse 05/26/2016  . Homeless 05/26/2016  . Essential hypertension 05/26/2016  . Alcohol dependence with alcohol-induced mood disorder ([place]) 02/14/2016  . Severe alcohol withdrawal without perceptual disturbances without complication ([place]) 79/04/4095  . Alcoholic hepatitis without ascites 02/14/2016  . Thrombocytopenia ([place]) 02/14/2016    Past Surgical History:  Procedure Laterality Date  . BIOPSY  08/05/2018   Procedure: BIOPSY;  Surgeon: Rush Landmark Telford Nab., MD;  Location: [place];  Service: Gastroenterology;;  . BIOPSY  08/07/2018   Procedure: BIOPSY;  Surgeon: Thornton Park, MD;  Location: [facility] ENDOSCOPY;  Service: Gastroenterology;;  . BIOPSY  01/02/2019   Procedure: BIOPSY;  Surgeon: Gatha Mayer, MD;  Location: [facility] ENDOSCOPY;  Service: Endoscopy;;  . COLONOSCOPY WITH PROPOFOL N/A 08/07/2018   Procedure: COLONOSCOPY WITH PROPOFOL;  Surgeon: Thornton Park, MD;  Location: [place];  Service: Gastroenterology;  Laterality: N/A;  . ESOPHAGOGASTRODUODENOSCOPY N/A 01/02/2019   Procedure: ESOPHAGOGASTRODUODENOSCOPY (EGD);  Surgeon: Gatha Mayer, MD;  Location: [facility];  Service: Endoscopy;  Laterality: N/A;  . ESOPHAGOGASTRODUODENOSCOPY N/A 07/03/2019   Procedure: ESOPHAGOGASTRODUODENOSCOPY (EGD);  Surgeon: Wonda Horner, MD;  Location: [facility];  Service: Endoscopy;  Laterality: N/A;  . ESOPHAGOGASTRODUODENOSCOPY (EGD) WITH PROPOFOL  N/A 11/02/2016   Procedure: ESOPHAGOGASTRODUODENOSCOPY (EGD) WITH PROPOFOL;  Surgeon: Carol Ada, MD;  Location: [facility] ENDOSCOPY;  Service: Endoscopy;  Laterality: N/A;  . ESOPHAGOGASTRODUODENOSCOPY (EGD) WITH PROPOFOL N/A 08/05/2018    Procedure: ESOPHAGOGASTRODUODENOSCOPY (EGD) WITH PROPOFOL;  Surgeon: Rush Landmark Telford Nab., MD;  Location: [place];  Service: Gastroenterology;  Laterality: N/A;  . ESOPHAGOGASTRODUODENOSCOPY (EGD) WITH PROPOFOL N/A 07/14/2019   Procedure: ESOPHAGOGASTRODUODENOSCOPY (EGD) WITH PROPOFOL;  Surgeon: Otis Brace, MD;  [facility]: [facility] ENDOSCOPY;  Service: Gastroenterology;  Laterality: N/A;  . HERNIA REPAIR    . LEG SURGERY    . POLYPECTOMY  08/07/2018   Procedure: POLYPECTOMY;  Surgeon: Thornton Park, MD;  Location: [facility] ENDOSCOPY;  Service: Gastroenterology;;       Family History  Problem Relation Age of Onset  . Diabetes Mother   . Alcoholism Mother   . Emphysema Father   . Lung cancer Father   . Alcoholism Father     Social History   Tobacco Use  . Smoking status: Current Every Day Smoker    Packs/day: 1.00    Types: Cigarettes  . Smokeless tobacco: Never Used  Vaping Use  . Vaping Use: Never used  Substance Use Topics  . Alcohol use: Yes    Comment: 5+ BEERS DAILY, Patient does not know how much he drank tonight  . Drug use: Not Currently    Frequency: 3.0 times per week    Types: Cocaine    Home Medications Prior to Admission medications   Medication Sig Start Date End Date Taking? Authorizing Provider  ferrous sulfate 325 (65 FE) MG tablet Take 1 tablet (325 mg total) by mouth daily with breakfast. Patient not taking: Reported on 09/23/2018 08/09/18 10/09/18  Elodia Florence., MD    Allergies    Tomato, Aspirin, and Sulfa antibiotics  Review of Systems   Review of Systems  All other systems reviewed and are negative.   Physical Exam Updated Vital Signs BP 135/73   Pulse 99   Temp 99.4 F (37.4 C) (Oral)   Resp (!) 21   Ht 5\' 7"  (1.702 m)   Wt 78 kg   SpO2 96%   BMI 26.93 kg/m   Physical Exam Vitals and nursing note reviewed.  Constitutional:      Appearance: He is well-developed.  HENT:     Head: Normocephalic and atraumatic.  Eyes:      Conjunctiva/sclera: Conjunctivae normal.  Cardiovascular:     Rate and Rhythm: Regular rhythm. Tachycardia present.     Heart sounds: No murmur heard.   Pulmonary:     Effort: Pulmonary effort is normal. No respiratory distress.     Breath sounds: Normal breath sounds.  Abdominal:     Palpations: Abdomen is soft.     Tenderness: There is no abdominal tenderness.  Musculoskeletal:        General: Normal range of motion.     Cervical back: Neck supple.  Skin:    General: Skin is warm and dry.  Neurological:     Mental Status: He is alert and oriented to person, place, and time.  Psychiatric:        Mood and Affect: Mood normal.        Behavior: Behavior normal.     ED Results / Procedures / Treatments   Labs (all labs ordered are listed, but only abnormal results are displayed) Labs Reviewed  BASIC METABOLIC PANEL - Abnormal; Notable for the following components:      Result Value  Glucose, Bld 125 (*)    All other components within normal limits  CBC - Abnormal; Notable for the following components:   RBC 4.08 (*)    Hemoglobin 8.8 (*)    HCT 29.7 (*)    MCV 72.8 (*)    MCH 21.6 (*)    MCHC 29.6 (*)    RDW 18.6 (*)    Platelets 107 (*)    All other components within normal limits  RESPIRATORY PANEL BY RT PCR (FLU A&B, COVID)  PROTIME-INR  TROPONIN I (HIGH SENSITIVITY)  TROPONIN I (HIGH SENSITIVITY)    EKG None  Radiology DG Chest 2 View  Result Date: 02/11/2020 CLINICAL DATA:  Chest pain, right-sided X 1 day.  Current smoker. EXAM: CHEST - 2 VIEW COMPARISON:  Chest x-ray 12/12/2018 FINDINGS: The heart size and mediastinal contours are within normal limits. No focal consolidation. No pulmonary edema. No pleural effusion. No pneumothorax. No acute osseous abnormality.  Old healed left clavicular fracture. IMPRESSION: 1. No active cardiopulmonary disease. 2. If clinically appropriate, please evaluate patient on age and smoking history to determine if patient  meets the inclusion criteria for specialized low-dose lung cancer screening chest CT. Electronically Signed   By: Iven Finn M.D.   On: 02/11/2020 22:21   CT Angio Chest PE W and/or Wo Contrast  Result Date: 02/12/2020 CLINICAL DATA:  Pulmonary embolus suspected with high probability. Central chest pain and mild shortness of breath this evening. Alcohol use. EXAM: CT ANGIOGRAPHY CHEST WITH CONTRAST TECHNIQUE: Multidetector CT imaging of the chest was performed using the standard protocol during bolus administration of intravenous contrast. Multiplanar CT image reconstructions and MIPs were obtained to evaluate the vascular anatomy. CONTRAST:  167mL OMNIPAQUE IOHEXOL 350 MG/ML SOLN COMPARISON:  07/02/2019 FINDINGS: Cardiovascular: Moderately good opacification of the central and segmental pulmonary arteries. No focal filling defects. No evidence of significant pulmonary embolus. Normal heart size. No pericardial effusions. Normal caliber thoracic aorta. No aortic dissection. Great vessel origins are patent. Mild aortic and coronary artery calcifications. Mediastinum/Nodes: Esophagus is decompressed. No significant lymphadenopathy in the chest. Lungs/Pleura: The lungs are clear. No pleural effusions. No pneumothorax. Airways are patent. Upper Abdomen: No acute abnormalities demonstrated in the upper abdomen. Musculoskeletal: No chest wall abnormality. No acute or significant osseous findings. Review of the MIP images confirms the above findings. IMPRESSION: 1. No evidence of significant pulmonary embolus. 2. No evidence of active pulmonary disease. 3. Aortic atherosclerosis. Aortic Atherosclerosis (ICD10-I70.0). Electronically Signed   By: Lucienne Capers M.D.   On: 02/12/2020 03:31    Procedures Procedures (including critical care time)  Medications Ordered in ED Medications  LORazepam (ATIVAN) injection 0-4 mg (2 mg Intravenous Given 02/12/20 0333)    Or  LORazepam (ATIVAN) tablet 0-4 mg ( Oral  See Alternative 02/12/20 0333)  LORazepam (ATIVAN) injection 0-4 mg (has no administration in time range)    Or  LORazepam (ATIVAN) tablet 0-4 mg (has no administration in time range)  thiamine tablet 100 mg (has no administration in time range)    Or  thiamine (B-1) injection 100 mg (has no administration in time range)  morphine 4 MG/ML injection 4 mg (4 mg Intravenous Given 02/12/20 0334)  ondansetron (ZOFRAN) injection 4 mg (4 mg Intravenous Given 02/12/20 0333)  iohexol (OMNIPAQUE) 350 MG/ML injection 100 mL (100 mLs Intravenous Contrast Given 02/12/20 0318)    ED Course  I have reviewed the triage vital signs and the nursing notes.  Pertinent labs & imaging results that were available  during my care of the patient were reviewed by me and considered in my medical decision making (see chart for details).    MDM Rules/Calculators/A&P                          This patient complains of chest pain, this involves an extensive number of treatment options, and is a complaint that carries with it a high risk of complications and morbidity.    Differential Dx ACS, PE, pleurisy, pneumonia, Covid, trauma, muscle spasm  Pertinent Labs I ordered, reviewed, and interpreted labs, which included CBC, BMP, troponin.  Troponins were negative.  CBC near baseline.  BMP about baseline.  Imaging Interpretation I ordered imaging studies which included chest x-ray and CT PE, which showed no evidence of PE or other acute findings.  Reassessments After the interventions stated above, I reevaluated the patient and found still somewhat tachycardic, but this could be from early alcohol withdrawal.  He is given Ativan per CIWA protocol.  Consultants None.  Plan Discharge with outpatient follow-up.    Final Clinical Impression(s) / ED Diagnoses Final diagnoses:  Precordial chest pain    Rx / DC Orders ED Discharge Orders    None       Montine Circle, PA-C 02/12/20 0404    Mesner,  Corene Cornea, MD 02/12/20 [phone]

## 2020-03-08 IMAGING — CT CT CERVICAL SPINE WITHOUT CONTRAST
5 of 7 series · 13 of 33 positions shown, 14 images · non-contrast
Comparison: 08/13/2018

CLINICAL DATA: Pain status post multiple falls.

EXAM:
CT HEAD WITHOUT CONTRAST
CT CERVICAL SPINE WITHOUT CONTRAST
TECHNIQUE: Multidetector CT imaging of the head and cervical spine was
performed following the standard protocol without intravenous
contrast. Multiplanar CT image reconstructions of the cervical spine
were also generated.

[Series 5: head bone · axial · 0.44mm/px · z∈[-114,-52]mm · 2 of 94 slices shown]
[im 32/94  bone]
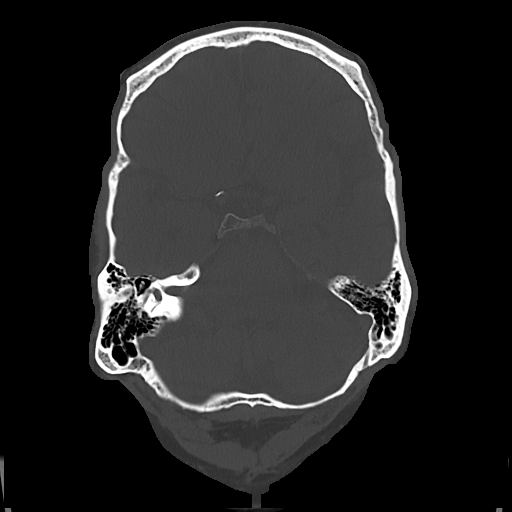
[im 63/94  bone]
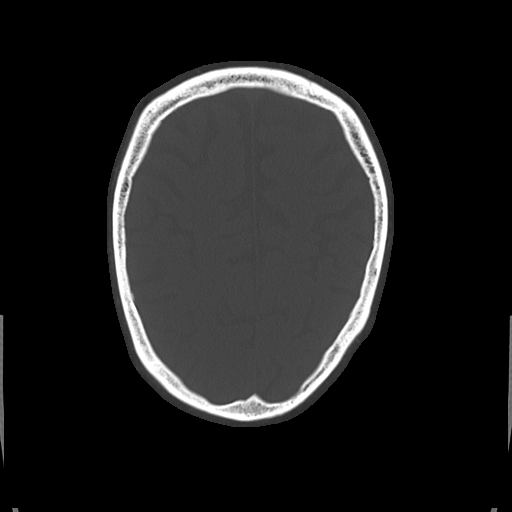

[Series 6: cor soft · coronal · 0.38mm/px · 3 of 72 slices shown]
[im 26/72  bone]
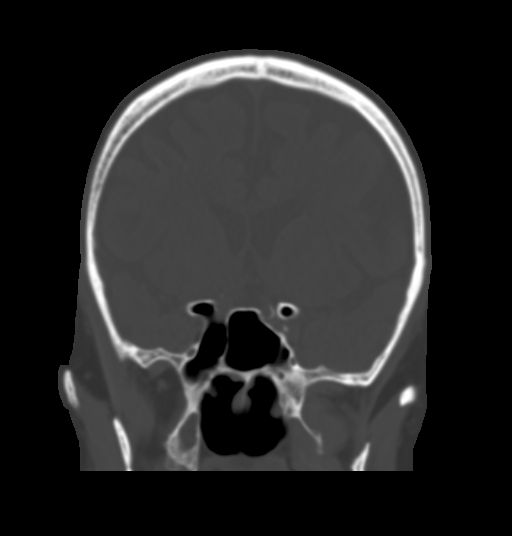
[im 33/72  bone]
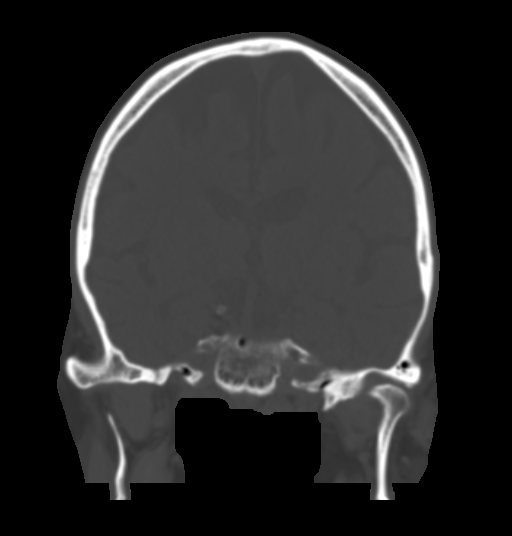
[im 40/72  bone]
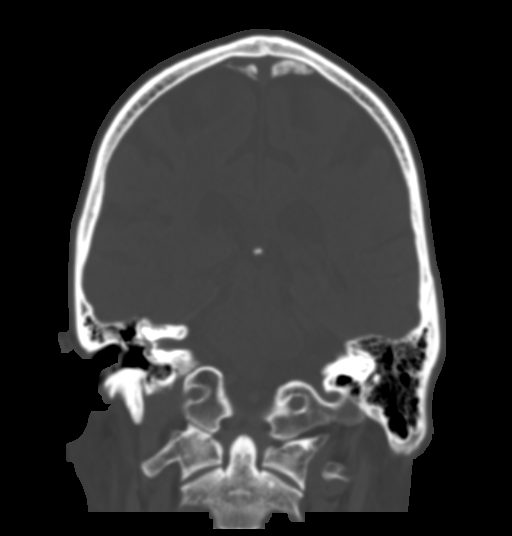

[Series 8: c spine bone · axial · 0.39mm/px · z∈[-237,-179]mm · 2 of 87 slices shown, 3 images]
[im 29/87  soft-tissue]
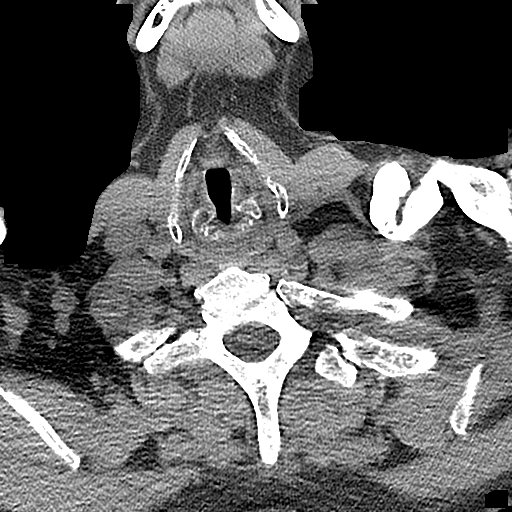
[im 29/87  bone]
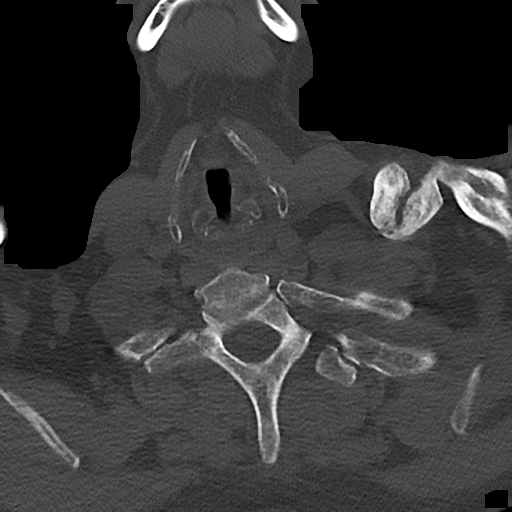
[im 58/87  bone]
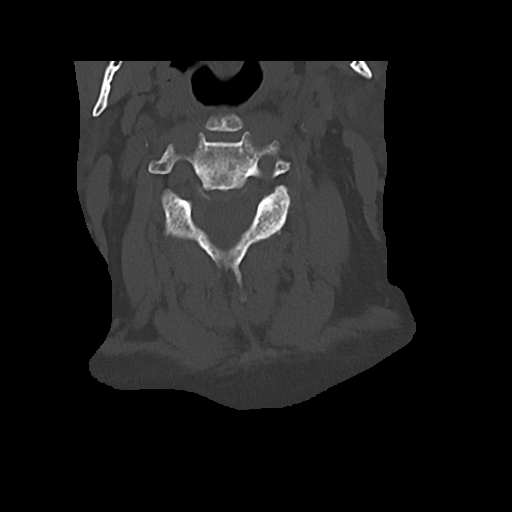

[Series 10: sag bone · sagittal · 0.27mm/px · 4 of 61 slices shown]
[im 13/61  bone]
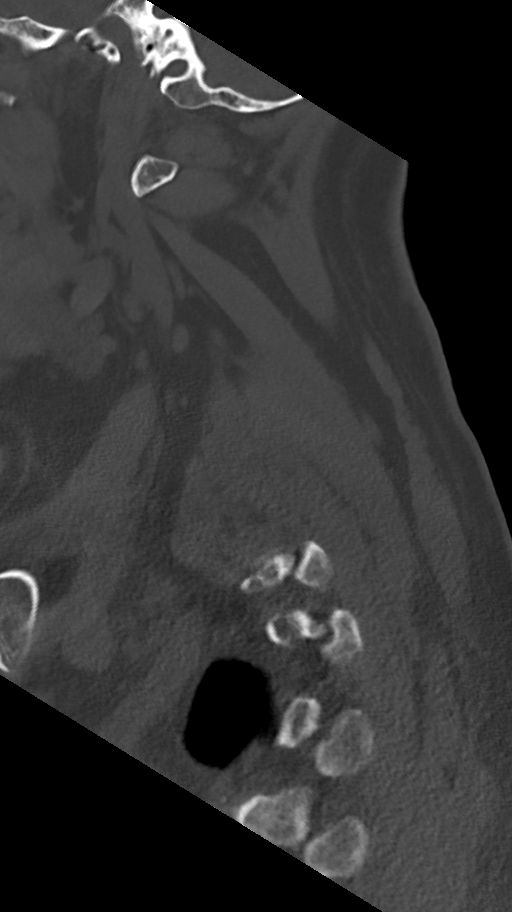
[im 25/61  bone]
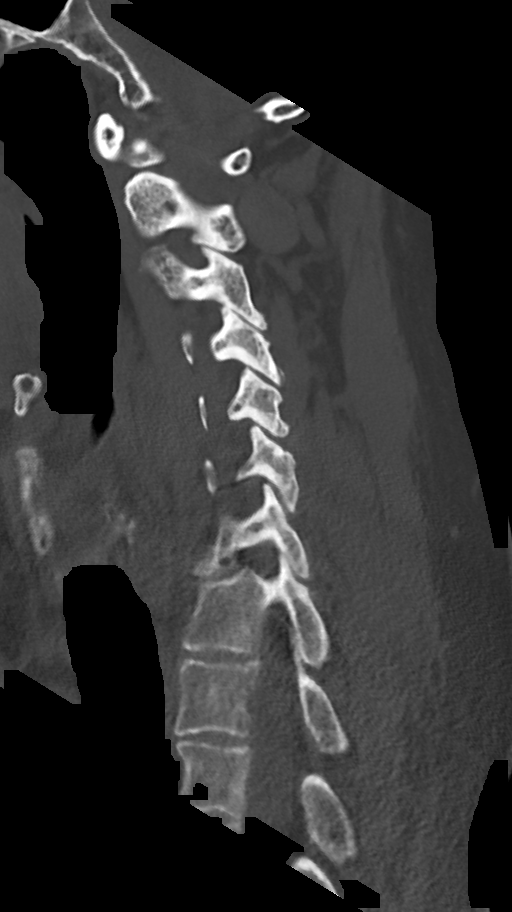
[im 37/61  bone]
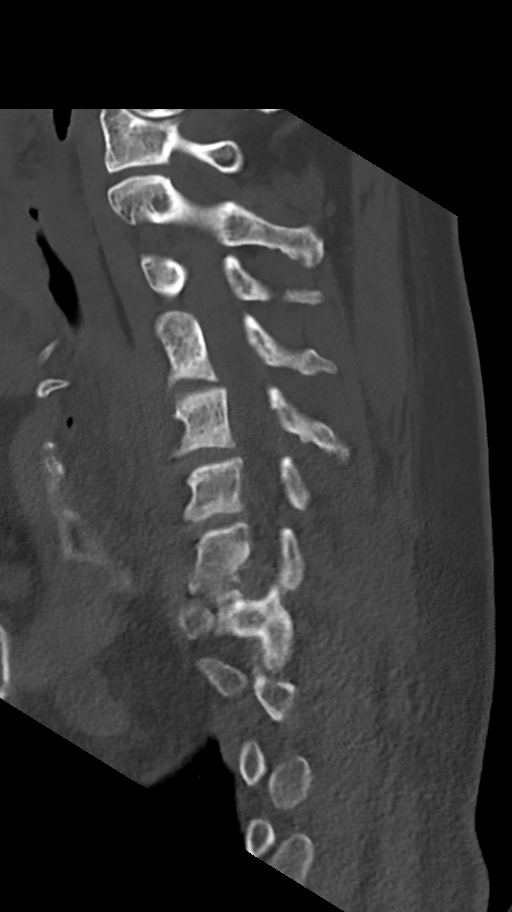
[im 49/61  bone]
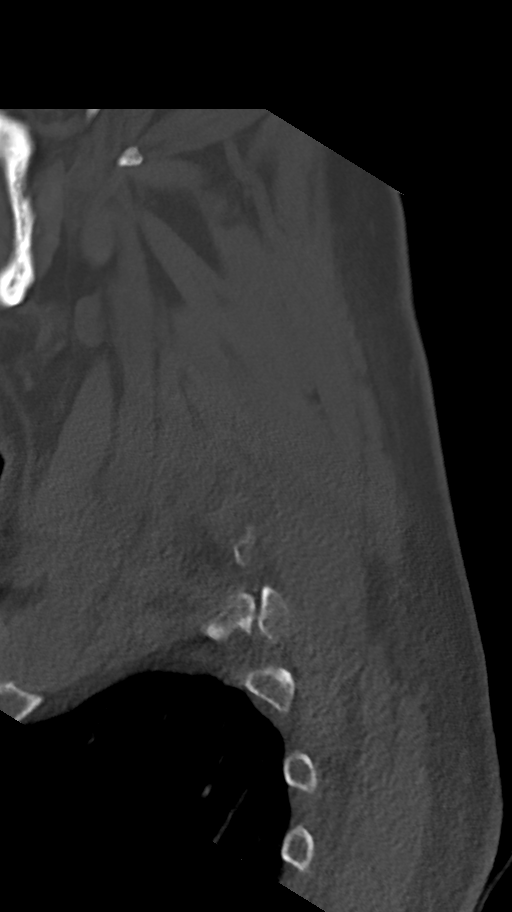

[Series 12: orthogonal axials · axial · 0.21mm/px · z∈[-251,-197]mm · 2 of 96 slices shown]
[im 32/96  bone]
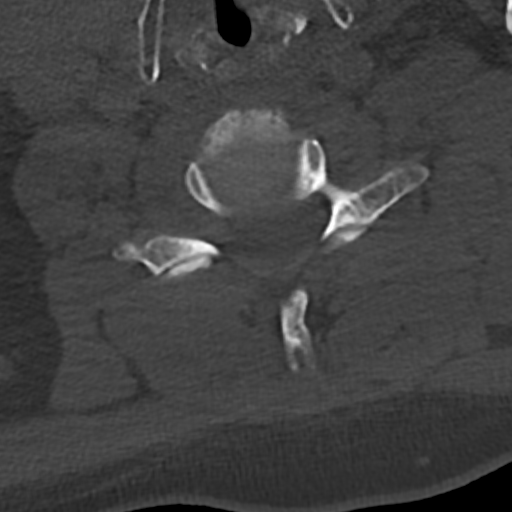
[im 64/96  bone]
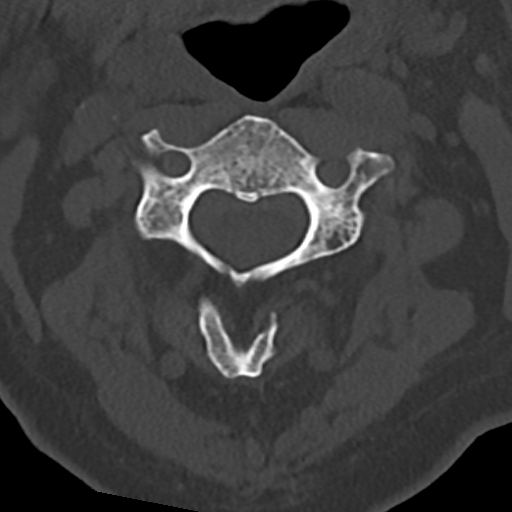

[13 of 33 positions shown; findings below may reference images not displayed]

FINDINGS: CT HEAD FINDINGS

Brain: No evidence of acute infarction, hemorrhage, hydrocephalus,
extra-axial collection or mass lesion/mass effect. There is age
related volume loss.

Vascular: No hyperdense vessel or unexpected calcification.

Skull: Normal. Negative for fracture or focal lesion. There may be
some mild posterior scalp swelling on the right.

Sinuses/Orbits: No acute finding.

Other: None.

CT CERVICAL SPINE FINDINGS

Alignment: There is reversal of the normal cervical lordotic
curvature.

Skull base and vertebrae: No acute fracture. No primary bone lesion
or focal pathologic process.

Soft tissues and spinal canal: No prevertebral fluid or swelling. No
visible canal hematoma.

Disc levels:  There is mild multilevel disc height loss.

Upper chest: Negative.

Other: There is an old fracture of the left clavicle.
IMPRESSION: 1. No acute intracranial abnormality detected.
2. No acute cervical spine fracture.
3. Mild-to-moderate volume loss with microvascular ischemic changes
bilaterally.

## 2020-03-08 IMAGING — CR CHEST - 2 VIEW
2 series · 2 of 2 positions shown · non-contrast
Comparison: 08/13/2018 and earlier.

CLINICAL DATA: 52-year-old male with falls on concrete. Loss of
consciousness. Pain.

EXAM:
CHEST - 2 VIEW

[chest lat]
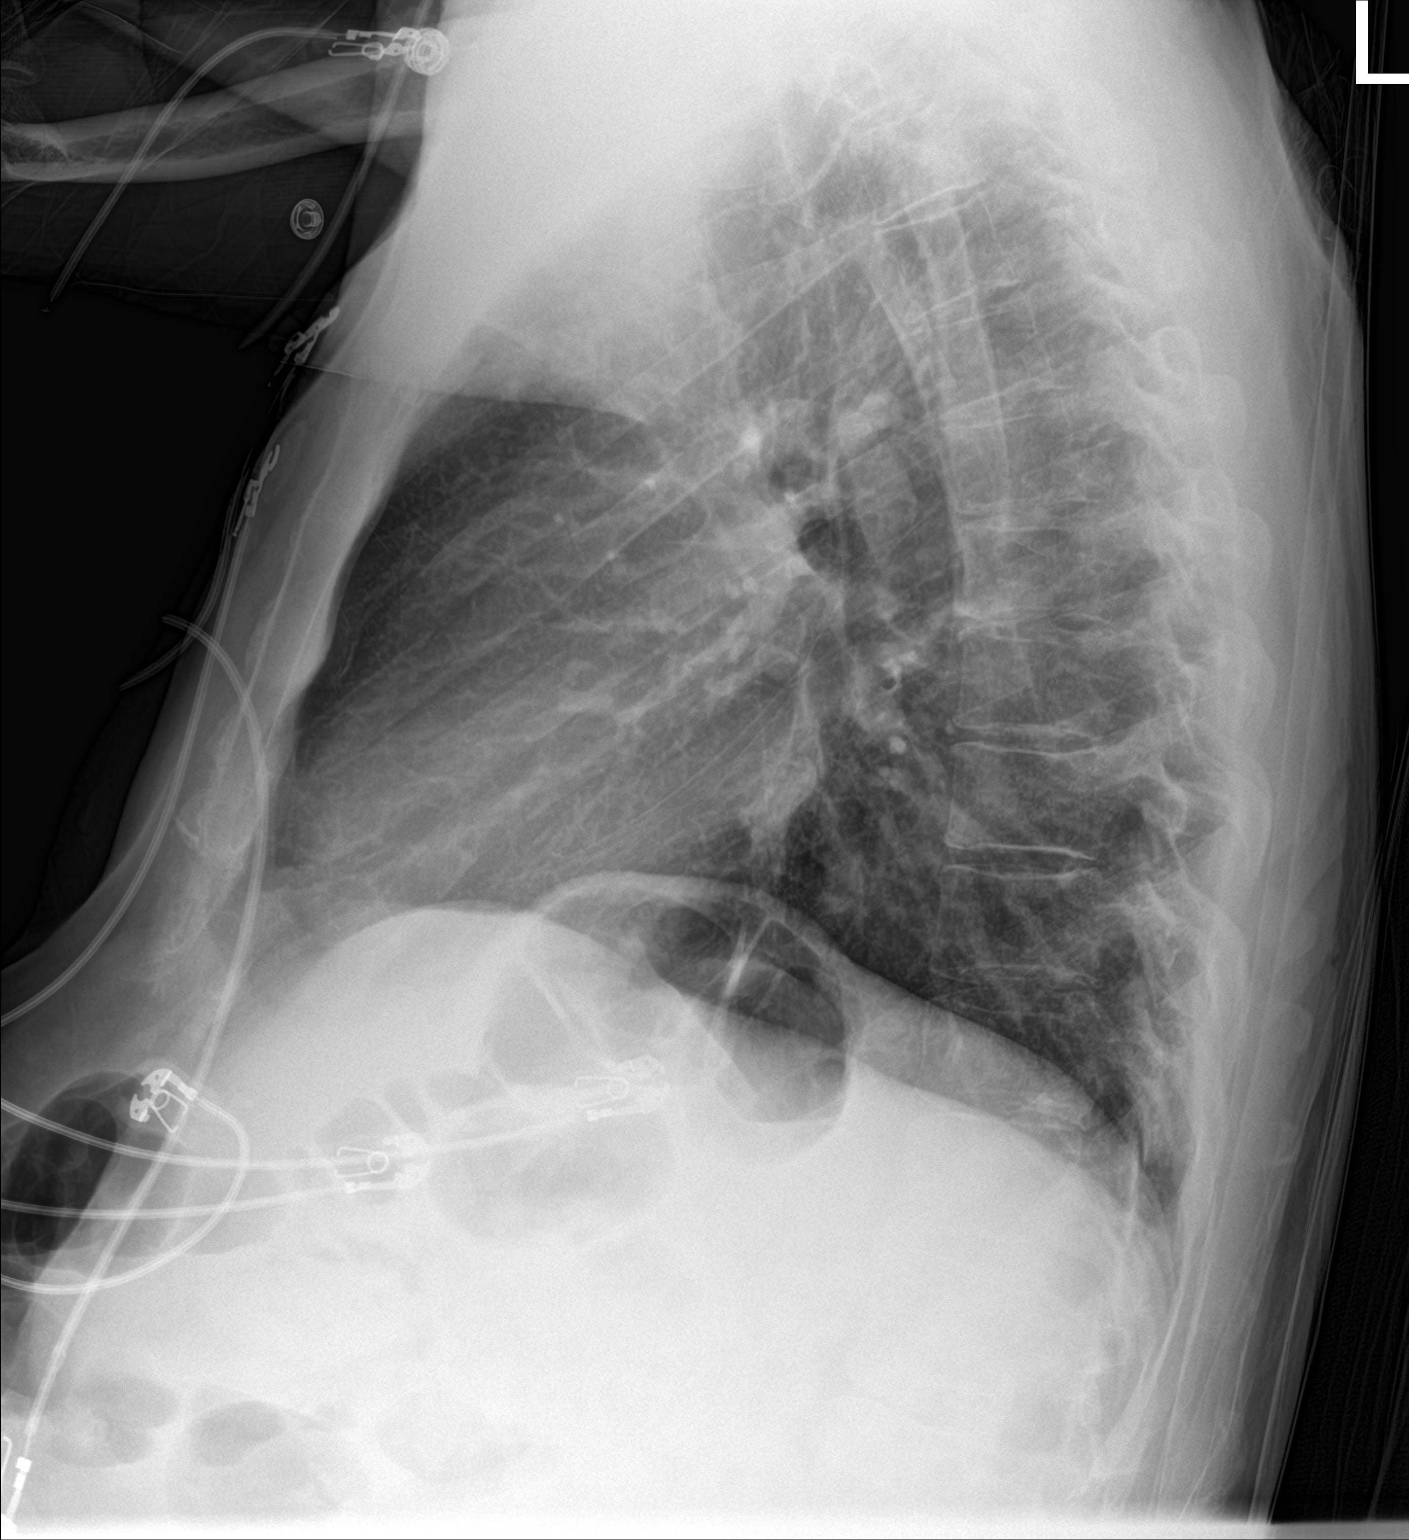

[chest ap]
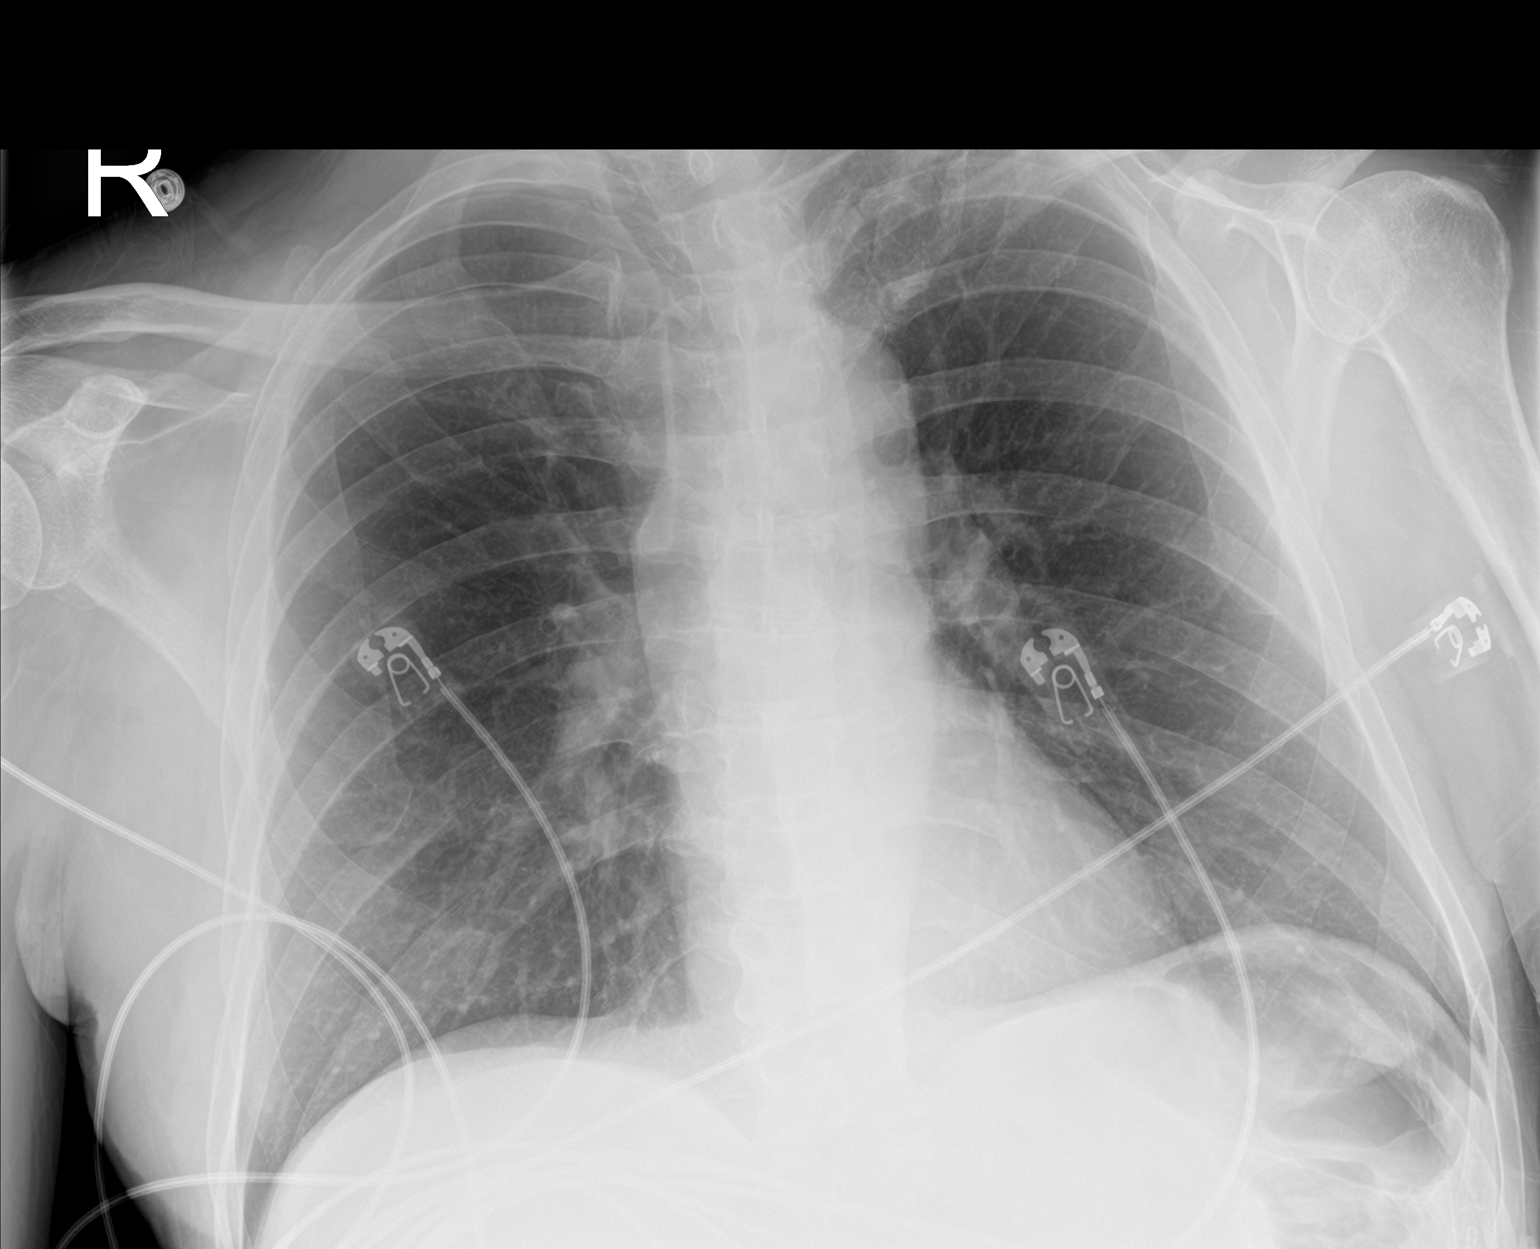

[2 of 2 positions shown; findings below may reference images not displayed]

FINDINGS: Upright AP and lateral views. Lung volumes and mediastinal contours
remain normal. Visualized tracheal air column is within normal
limits. Bulky chronic deformity of the medial left clavicle
redemonstrated. Both lungs appear clear. No pneumothorax or pleural
effusion. No acute osseous abnormality identified. Negative visible
bowel gas pattern.
IMPRESSION: 1. No cardiopulmonary abnormality.
2. Bulky chronic deformity of the medial left clavicle.

## 2020-05-30 ENCOUNTER — Encounter (HOSPITAL_COMMUNITY): Payer: Self-pay | Admitting: *Deleted

## 2020-05-30 ENCOUNTER — Emergency Department (HOSPITAL_COMMUNITY)
Admission: EM | Admit: 2020-05-30 | Discharge: 2020-05-31 | Disposition: A | Discharge status: Home / Self Care | Payer: Medicaid Other | Attending: Emergency Medicine | Admitting: Emergency Medicine

## 2020-05-30 ENCOUNTER — Other Ambulatory Visit: Payer: Self-pay

## 2020-05-30 DIAGNOSIS — F17210 Nicotine dependence, cigarettes, uncomplicated: Secondary | ICD-10-CM | POA: Insufficient documentation

## 2020-05-30 DIAGNOSIS — Y908 Blood alcohol level of 240 mg/100 ml or more: Secondary | ICD-10-CM | POA: Insufficient documentation

## 2020-05-30 DIAGNOSIS — I10 Essential (primary) hypertension: Secondary | ICD-10-CM | POA: Insufficient documentation

## 2020-05-30 DIAGNOSIS — Z20822 Contact with and (suspected) exposure to covid-19: Secondary | ICD-10-CM | POA: Insufficient documentation

## 2020-05-30 DIAGNOSIS — F1024 Alcohol dependence with alcohol-induced mood disorder: Secondary | ICD-10-CM | POA: Diagnosis present

## 2020-05-30 DIAGNOSIS — R45851 Suicidal ideations: Secondary | ICD-10-CM | POA: Insufficient documentation

## 2020-05-30 DIAGNOSIS — R569 Unspecified convulsions: Secondary | ICD-10-CM | POA: Insufficient documentation

## 2020-05-30 DIAGNOSIS — F10921 Alcohol use, unspecified with intoxication delirium: Secondary | ICD-10-CM

## 2020-05-30 DIAGNOSIS — F10121 Alcohol abuse with intoxication delirium: Secondary | ICD-10-CM | POA: Insufficient documentation

## 2020-05-30 LAB — COMPREHENSIVE METABOLIC PANEL
ALT: 55 U/L — ABNORMAL HIGH (ref 0–44)
AST: 138 U/L — ABNORMAL HIGH (ref 15–41)
Albumin: 4.1 g/dL (ref 3.5–5.0)
Alkaline Phosphatase: 135 U/L — ABNORMAL HIGH (ref 38–126)
Anion gap: 9 (ref 5–15)
BUN: 5 mg/dL — ABNORMAL LOW (ref 6–20)
CO2: 25 mmol/L (ref 22–32)
Calcium: 9 mg/dL (ref 8.9–10.3)
Chloride: 103 mmol/L (ref 98–111)
Creatinine, Ser: 0.75 mg/dL (ref 0.61–1.24)
GFR, Estimated: >60 mL/min (ref >60)
Glucose, Bld: 102 mg/dL — ABNORMAL HIGH (ref 70–99)
Potassium: 4 mmol/L (ref 3.5–5.1)
Sodium: 137 mmol/L (ref 135–145)
Total Bilirubin: 0.4 mg/dL (ref 0.3–1.2)
Total Protein: 8.1 g/dL (ref 6.5–8.1)

## 2020-05-30 LAB — CBC WITH DIFFERENTIAL/PLATELET
Abs Immature Granulocytes: 0.01 10*3/uL (ref 0.00–0.07)
Basophils Absolute: 0.1 10*3/uL (ref 0.0–0.1)
Basophils Relative: 1 %
Eosinophils Absolute: 0.2 10*3/uL (ref 0.0–0.5)
Eosinophils Relative: 4 %
HCT: 30.6 % — ABNORMAL LOW (ref 39.0–52.0)
Hemoglobin: 9.3 g/dL — ABNORMAL LOW (ref 13.0–17.0)
Immature Granulocytes: 0 %
Lymphocytes Relative: 48 %
Lymphs Abs: 2.1 10*3/uL (ref 0.7–4.0)
MCH: 21.9 pg — ABNORMAL LOW (ref 26.0–34.0)
MCHC: 30.4 g/dL (ref 30.0–36.0)
MCV: 72 fL — ABNORMAL LOW (ref 80.0–100.0)
Monocytes Absolute: 0.7 10*3/uL (ref 0.1–1.0)
Monocytes Relative: 15 %
Neutro Abs: 1.4 10*3/uL — ABNORMAL LOW (ref 1.7–7.7)
Neutrophils Relative %: 32 %
Platelets: 111 10*3/uL — ABNORMAL LOW (ref 150–400)
RBC: 4.25 MIL/uL (ref 4.22–5.81)
RDW: 19.3 % — ABNORMAL HIGH (ref 11.5–15.5)
WBC: 4.3 10*3/uL (ref 4.0–10.5)
nRBC: 0 % (ref 0.0–0.2)

## 2020-05-30 LAB — RESP PANEL BY RT-PCR (FLU A&B, COVID) ARPGX2
Influenza A by PCR: NEGATIVE
Influenza B by PCR: NEGATIVE
SARS Coronavirus 2 by RT PCR: NEGATIVE

## 2020-05-30 LAB — ETHANOL: Alcohol, Ethyl (B): 383 mg/dL (ref <10)

## 2020-05-30 MED ORDER — THIAMINE HCL 100 MG/ML IJ SOLN
100.0000 mg | Freq: Every day | INTRAMUSCULAR | Status: DC
  Administered 2020-05-30: 100 mg via INTRAVENOUS
  Filled 2020-05-30: qty 2

## 2020-05-30 MED ORDER — LORAZEPAM 1 MG PO TABS
0.0000 mg | ORAL_TABLET | Freq: Four times a day (QID) | ORAL | Status: DC
  Administered 2020-05-31: 1 mg via ORAL
  Filled 2020-05-30: qty 1

## 2020-05-30 MED ORDER — LEVETIRACETAM IN NACL 500 MG/100ML IV SOLN
500.0000 mg | Freq: Two times a day (BID) | INTRAVENOUS | Status: DC
  Administered 2020-05-30 – 2020-05-31 (×2): 500 mg via INTRAVENOUS
  Filled 2020-05-30 (×4): qty 100

## 2020-05-30 MED ORDER — THIAMINE HCL 100 MG PO TABS
100.0000 mg | ORAL_TABLET | Freq: Every day | ORAL | Status: DC
  Administered 2020-05-31: 100 mg via ORAL
  Filled 2020-05-30: qty 1

## 2020-05-30 MED ORDER — LORAZEPAM 1 MG PO TABS: 0.0000 mg | ORAL_TABLET | Freq: Two times a day (BID) | ORAL | Status: DC

## 2020-05-30 MED ORDER — LORAZEPAM 2 MG/ML IJ SOLN
0.0000 mg | Freq: Four times a day (QID) | INTRAMUSCULAR | Status: DC
  Administered 2020-05-30 – 2020-05-31 (×2): 1 mg via INTRAVENOUS
  Filled 2020-05-30 (×2): qty 1

## 2020-05-30 MED ORDER — LEVETIRACETAM 500 MG PO TABS: 500.0000 mg | ORAL_TABLET | Freq: Two times a day (BID) | ORAL | Status: DC

## 2020-05-30 MED ORDER — LORAZEPAM 2 MG/ML IJ SOLN: 0.0000 mg | Freq: Two times a day (BID) | INTRAMUSCULAR | Status: DC

## 2020-05-30 MED ORDER — SODIUM CHLORIDE 0.9 % IV BOLUS
500.0000 mL | Freq: Once | INTRAVENOUS | Status: AC
  Administered 2020-05-30: 500 mL via INTRAVENOUS

## 2020-05-30 MED ORDER — LORAZEPAM 2 MG/ML IJ SOLN
1.0000 mg | Freq: Once | INTRAMUSCULAR | Status: AC
  Administered 2020-05-30: 1 mg via INTRAVENOUS
  Filled 2020-05-30: qty 1

## 2020-05-30 NOTE — ED Provider Notes (Signed)
[facility] EMERGENCY DEPARTMENT Provider Note   CSN: [id number] Arrival date & time: 05/30/20  2047     History Chief Complaint  Patient presents with  . Seizures    Barry Moore is a 55 y.o. male.  HPI Homeless adult male with history of alcohol abuse and depression presents via EMS due to concern for suicidal ideation, possible seizures. Patient notes that he has not been taking his medication, as he cannot afford it. He is however, drinking substantial amounts of beer daily.  Today after he had a seizure like episode he called EMS.  EMS reports that in route the patient had 130-second period of bilateral upper extremity clonic activity, but was seemingly interactive during this period.  No subsequent time consistent with postictal phase.  Patient currently denies pain, focal weakness, confusion.  He states that with his worsening alcohol abuse he is having increasing thoughts of killing himself.     Past Medical History:  Diagnosis Date  . Alcoholic hepatitis   . Depression   . ETOH abuse   . Gastric bleed 08/2018  . Hypertension   . Pancreatitis   . Seizures ([place])   . Suicidal behavior     Patient Active Problem List   Diagnosis Date Noted  . Nicotine dependence, cigarettes, uncomplicated 29/79/8921  . Alcohol abuse with intoxication ([place]) 07/19/2019  . Orthostatic syncope 07/19/2019  . Upper GI bleeding 07/19/2019  . Acute upper GI bleeding 07/02/2019  . Duodenitis   . Acute upper GI bleed 01/01/2019  . Hypokalemia 01/01/2019  . Alcohol dependence ([place]) 01/01/2019  . Alcoholic liver disease ([place]) 01/01/2019  . Acute pancreatitis 12/13/2018  . Free intraperitoneal air 12/13/2018  . Homicidal ideation   . Gastrointestinal hemorrhage with melena 08/31/2018  . Colon ulcer   . Polyp of ascending colon   . Acute GI bleeding 08/05/2018  . Transaminitis 08/05/2018  . Abdominal pain 08/05/2018  . Nausea and vomiting 08/05/2018  . Substance  induced mood disorder ([place]) 07/16/2017  . Severe recurrent major depression without psychotic features ([place]) 05/15/2017  . Microcytic anemia 03/18/2017  . Acute blood loss anemia 02/21/2017  . Upper GI bleed 02/08/2017  . Alcohol withdrawal ([place]) 02/08/2017  . Cocaine abuse ([place]) 11/18/2016  . GI bleed 11/01/2016  . Alcohol abuse   . Major depressive disorder, recurrent severe without psychotic features ([place]) 07/06/2016  . Alcohol-induced mood disorder ([place]) 06/15/2016  . Seizure ([place]) 05/26/2016  . Tobacco abuse 05/26/2016  . Homeless 05/26/2016  . Essential hypertension 05/26/2016  . Alcohol dependence with alcohol-induced mood disorder ([place]) 02/14/2016  . Severe alcohol withdrawal without perceptual disturbances without complication ([place]) 19/41/7408  . Alcoholic hepatitis without ascites 02/14/2016  . Thrombocytopenia ([place]) 02/14/2016    Past Surgical History:  Procedure Laterality Date  . BIOPSY  08/05/2018   Procedure: BIOPSY;  Surgeon: Rush Landmark Telford Nab., MD;  Location: [place];  Service: Gastroenterology;;  . BIOPSY  08/07/2018   Procedure: BIOPSY;  Surgeon: Thornton Park, MD;  Location: [facility] ENDOSCOPY;  Service: Gastroenterology;;  . BIOPSY  01/02/2019   Procedure: BIOPSY;  Surgeon: Gatha Mayer, MD;  Location: [facility] ENDOSCOPY;  Service: Endoscopy;;  . COLONOSCOPY WITH PROPOFOL N/A 08/07/2018   Procedure: COLONOSCOPY WITH PROPOFOL;  Surgeon: Thornton Park, MD;  Location: [place];  Service: Gastroenterology;  Laterality: N/A;  . ESOPHAGOGASTRODUODENOSCOPY N/A 01/02/2019   Procedure: ESOPHAGOGASTRODUODENOSCOPY (EGD);  Surgeon: Gatha Mayer, MD;  Location: [facility];  Service: Endoscopy;  Laterality: N/A;  . ESOPHAGOGASTRODUODENOSCOPY N/A  07/03/2019   Procedure: ESOPHAGOGASTRODUODENOSCOPY (EGD);  Surgeon: Wonda Horner, MD;  Location: [facility];  Service: Endoscopy;  Laterality: N/A;  . ESOPHAGOGASTRODUODENOSCOPY (EGD) WITH PROPOFOL N/A 11/02/2016    Procedure: ESOPHAGOGASTRODUODENOSCOPY (EGD) WITH PROPOFOL;  Surgeon: Carol Ada, MD;  Location: [facility];  Service: Endoscopy;  Laterality: N/A;  . ESOPHAGOGASTRODUODENOSCOPY (EGD) WITH PROPOFOL N/A 08/05/2018   Procedure: ESOPHAGOGASTRODUODENOSCOPY (EGD) WITH PROPOFOL;  Surgeon: Rush Landmark Telford Nab., MD;  Location: [place];  Service: Gastroenterology;  Laterality: N/A;  . ESOPHAGOGASTRODUODENOSCOPY (EGD) WITH PROPOFOL N/A 07/14/2019   Procedure: ESOPHAGOGASTRODUODENOSCOPY (EGD) WITH PROPOFOL;  Surgeon: Otis Brace, MD;  Location: [facility]OS[facility];  Service: Gastroenterology;  Laterality: N/A;  . HERNIA REPAIR    . LEG SURGERY    . POLYPECTOMY  08/07/2018   Procedure: POLYPECTOMY;  Surgeon: Thornton Park, MD;  Location[facility] ENDOSCOPY;  Service: Gastroenterology;;       Family History  Problem Relation Age of Onset  . Diabetes Mother   . Alcoholism Mother   . Emphysema Father   . Lung cancer Father   . Alcoholism Father     Social History   Tobacco Use  . Smoking status: Current Every Day Smoker    Packs/day: 1.00    Types: Cigarettes  . Smokeless tobacco: Never Used  Vaping Use  . Vaping Use: Never used  Substance Use Topics  . Alcohol use: Yes    Comment: 5+ BEERS DAILY, Patient does not know how much he drank tonight  . Drug use: Not Currently    Frequency: 3.0 times per week    Types: Cocaine    Home Medications Prior to Admission medications   Medication Sig Start Date End Date Taking? Authorizing Provider  ferrous sulfate 325 (65 FE) MG tablet Take 1 tablet (325 mg total) by mouth daily with breakfast. Patient not taking: Reported on 09/23/2018 08/09/18 10/09/18  Elodia Florence., MD    Allergies    Tomato, Aspirin, and Sulfa antibiotics  Review of Systems   Review of Systems  Constitutional:       Per HPI, otherwise negative  HENT:       Per HPI, otherwise negative  Respiratory:       Per HPI, otherwise negative  Cardiovascular:        Per HPI, otherwise negative  Gastrointestinal: Negative for vomiting.  Endocrine:       Negative aside from HPI  Genitourinary:       Neg aside from HPI   Musculoskeletal:       Per HPI, otherwise negative  Skin: Negative.   Neurological: Positive for seizures. Negative for syncope.  Psychiatric/Behavioral: Positive for suicidal ideas.    Physical Exam Updated Vital Signs BP (!) 142/84 (BP Location: Right Arm)   Pulse 90   Temp 99 F (37.2 C) (Oral)   Resp 12   Ht 5\' 7"  (1.702 m)   Wt 81.6 kg   SpO2 99%   BMI 28.19 kg/m   Physical Exam Vitals and nursing note reviewed.  Constitutional:      General: He is not in acute distress.    Appearance: He is well-developed.  HENT:     Head: Normocephalic and atraumatic.  Eyes:     Conjunctiva/sclera: Conjunctivae normal.  Cardiovascular:     Rate and Rhythm: Normal rate and regular rhythm.  Pulmonary:     Effort: Pulmonary effort is normal. No respiratory distress.     Breath sounds: No stridor.  Abdominal:     General:  There is no distension.  Skin:    General: Skin is warm and dry.  Neurological:     Mental Status: He is alert and oriented to person, place, and time.     Cranial Nerves: No dysarthria.     Comments: No ongoing seizure activity once he has been settled in the ED, placed on monitors.  Psychiatric:        Behavior: Behavior is slowed and withdrawn.     ED Results / Procedures / Treatments   Labs (all labs ordered are listed, but only abnormal results are displayed) Labs Reviewed  ETHANOL  COMPREHENSIVE METABOLIC PANEL  CBC WITH DIFFERENTIAL/PLATELET    EKG None  Radiology No results found.  Procedures Procedures   Medications Ordered in ED Medications  levETIRAcetam (KEPPRA) IVPB 500 mg/100 mL premix (has no administration in time range)  LORazepam (ATIVAN) injection 1 mg (1 mg Intravenous Given 05/30/20 2119)  sodium chloride 0.9 % bolus 500 mL (500 mLs Intravenous New Bag/Given 05/30/20  2120)    ED Course  I have reviewed the triage vital signs and the nursing notes.  Pertinent labs & imaging results that were available during my care of the patient were reviewed by me and considered in my medical decision making (see chart for details). Review after initial evaluation notable for 5 prior visits in the past 6 months, including behavioral health evaluations, and prior presentations for alcohol abuse.  This adult male, disenfranchised, without capacity to pay for his medication, presents after possible seizure activity, now with suicidal ideation and gross alcohol intoxication. No obvious physical deformities, the patient denies any pain.  Vital signs are within normal limits.  Some suspicion for alcohol intoxication contributing to his presentation as well as the medication noncompliance as above Patient received Keppra, Ativan, fluids, had labs sent, will likely require some period of monitoring prior to medical clearance for anticipated behavioral health evaluation.  Final Clinical Impression(s) / ED Diagnoses Final diagnoses:  Acute alcoholic intoxication with delirium ([place])  Seizure-like activity ([place])  Suicidal ideation     Carmin Muskrat, MD 05/30/20 2137

## 2020-05-30 NOTE — ED Triage Notes (Signed)
The ptarrived by gems from the street.  He reports that he is having seizures  On the way here the pt  Had an eisode of both arms shaking that lasted  approx 15 seconds no loc  .  He is a heavy drinker.  The last time he had a drink was a few minutes before ems arrived  He told ems that  He drinks 4-5 40 oz beers every day

## 2020-05-31 DIAGNOSIS — R45851 Suicidal ideations: Secondary | ICD-10-CM

## 2020-05-31 DIAGNOSIS — F1024 Alcohol dependence with alcohol-induced mood disorder: Secondary | ICD-10-CM

## 2020-05-31 NOTE — ED Notes (Signed)
Pt TTS completed. Pt changed into purple scrubs and belongings bagged up. Pt reported no SI or HI to this RN stating - "I don't think I could if I wanted to" - Security to come wand pt and belongings.

## 2020-05-31 NOTE — Progress Notes (Signed)
CSW added outpatient substance abuse resources to the AVS.  Madilyn Fireman, MSW, LCSW Transitions of Care  Clinical Social Worker II [phone]

## 2020-05-31 NOTE — ED Notes (Signed)
Pt is getting TTS in room 36
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
## 2020-05-31 NOTE — Discharge Instructions (Addendum)
**Tonight is a white flag night at the shelter - all operations will be at [employer], [place]. The shelter is open from 8pm-7am.**   Homeless Shelter List:   [place] ([place]) [place], [place] Phone: [facility] has been providing emergency shelter to those in need of a permanent residence for over 35 years. The [employer] shelter plays an important role in our community.    There are many life events that can pull someone into a downward spiral towards poverty that is very difficult to get out of. Homelessness is a problem that can affect anyone of [name]. [employer] is a safe and comforting place to stay, especially if you have experienced the hardship of street life.    [employer] provides a single bed and bedding to 100 adult men and women. The shelter welcomes all who are in need of housing, no one in real need is turned away unless space is not available.    While staying at [facility], guests are offered more than just a bed for a night. Hot meals are provided and every guest has access to case management services. Case managers provide assistance with finding housing, employment, or other services that will help them gain stability. Continuous stay is based on availability, capacity, and progress towards goals.    To contact the front desk of [facility] please call   [phone] ext [phone] or ext. [phone].   [facility] [address] [place], [place] [address] Phone: [phone]   [facility] [address], [place] [address] Phone: [phone]   [place]: [phone]. [address] [place], [place] [address] Phone: [phone]   [facility] [address] [place], [place] [address] Check in at 6:00PM for placement at a local shelter) Phone:  [phone]  [facility] ([facility]): (offers outpatient therapy and intensive outpatient substance abuse therapy).  [address] [place], [place] [address] Phone: [phone]  [place]: Offers FREE recovery skills classes, support groups, 1:1 Peer Support, and Compeer Classes. [address], [place],  [address] Phone: [phone] (Call to complete intake).   [facility] [place],  [address] Phone: [phone] ext [phone]  [facility] provides food, shelter and other programs and services to the homeless men of [place] through our [employer] program.  By offering safe shelter, three meals a day, clean clothing, Biblical counseling, financial planning, vocational training, GED/education and employment assistance, we've helped mend the shattered lives of many homeless men since opening in 1974.  We have approximately 267 beds available, with a max of 312 beds including mats for emergency situations and currently house an average of 270 men a night.  Prospective Client Check-In Information Photo ID Required (State/ Out of State/ [facility]) - if photo ID is not available, clients are required to have a printout of a police/sheriff's criminal history report. Help out with chores around the [place]. No sex offender of any type (pending, charged, registered and/or any other sex related offenses) will be permitted to check in. Must be willing to abide by all rules, regulations, and policies established by the [employer]. The following will be provided - shelter, food, clothing, and biblical counseling. If you or someone you know is in need of  assistance at our [facility] shelter in [place], [place], please call [phone] ext. [phone].  [facility] ([facility]): (offers outpatient therapy and intensive outpatient substance abuse therapy).  [address], [place], [place] [address] Phone:  [phone]  [place]: Offers FREE recovery skills classes, support groups, 1:1 Peer Support, and Compeer Classes. [address] [place], [place] [address] Phone: [phone] (Call to complete intake).   [facility] [place], [place] [address] Phone: [phone] ext [phone]  [facility] provides food, shelter and other programs and services to the homeless men of [place] through our [employer] program.  By offering safe shelter, three meals a day, clean clothing, Biblical counseling, financial planning, vocational training, GED/education and employment assistance, we've helped mend the shattered lives of many homeless men since opening in 1974.  We have approximately 267 beds available, with a max of 312 beds including mats for emergency situations and currently house an average of 270 men a night.  Prospective Client Check-In Information Photo ID Required (State/ Out of State/ [facility]) - if photo ID is not available, clients are required to have a printout of a police/sheriff's criminal history report. Help out with chores around the [place]. No sex offender of any type (pending, charged, registered and/or any other sex related offenses) will be permitted to check in. Must be willing to abide by all rules, regulations, and policies established by the [employer]. The following will be provided - shelter, food, clothing, and biblical counseling. If you or someone you know is in need of assistance at our [facility] shelter in [place], [place], please call [phone] ext. [phone].  [facility]will provide timely access to mental health services for children and adolescents (4-17) and adults presenting in a mental health crisis. The program is designed for those who need urgent Behavioral Health or Substance Use treatment and are not experiencing a medical crisis that would  typically require an emergency room visit.    [place], [place] [address] Phone: [phone] [email or web address]   [facility] will also offer the following outpatient services: (Monday through Friday 8am-5pm)    Partial Hospitalization Program (PHP)  Substance Abuse Intensive Outpatient Program (SA-IOP)  Group Therapy  Medication Management  Peer Living Room   We also provide (24/7):    Assessments: Our mental health clinician and providers will conduct a focused mental health evaluation, assessing for immediate safety concerns and further mental health needs.   Referral: Our team will provide resources and help connect to community based mental health treatment, when indicated, including psychotherapy, psychiatry, and other specialized behavioral health or substance use disorder services (for those not already in treatment).   Transitional Care: Our team providers in person bridging and/or telphonic follow-up during the patient's transition to outpatient services.

## 2020-05-31 NOTE — BHH Suicide Risk Assessment (Cosign Needed Addendum)
Suicide Risk Assessment  Discharge Assessment   [employer] Discharge Suicide Risk Assessment   Principal Problem: Alcohol dependence with alcohol-induced mood disorder [facility]) Discharge Diagnoses: Principal Problem:   Alcohol dependence with alcohol-induced mood disorder ([place]) Active Problems:   Suicidal ideations   [name], 55 y.o., male patient [name] for evaluation of suspected seizures, acute alcohol intoxication and suicidal ideations.  Patient seen via telepsych by this provider; chart reviewed and consulted with Dr. Mallie Darting on 05/31/20.  On evaluation Anthonymichael Munday reports "its going but not the way I would like it, I am homeless and living in the woods."  He states he was brought in because he was drinking and "acting bad, they say I was suicidal but I don't recall this."  He endorses most of the information contained in TTS counselor report, is currently homeless and lives in a tent.  States he lost him apartment several years ago, he is vague regarding events that lead to this but does cite concerns with friends.  States all of his family are deceased, but has some friends who are also homeless.  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  Otherwise, states he is not suicidal or homicidal, expresses his desire to get back to his tent for fear that someone will take it.  He declines referral for inpatient Sud treatment, states he was discharged early summer from  [facility] and was told he had to wait a year to return.    On admission his BAL was 343, UDS otherwise negative; he was negative for covid.   He was started on CIWA protocol and started on Keppra for seizures.  He has been cooperative with staff, medication compliant and otherwise stabile, no withdrawal concerns, tremors or seizures.  Both appetite and sleep are good.   Demonstrates symptomatic improvement since  admission and as acute effects of alcohol wean.    Total Time spent with patient: 30 minutes  Musculoskeletal: Strength & Muscle Tone: within normal limits Gait & Station: normal Patient leans: Right  Psychiatric Specialty Exam: @ROSBYAGE @  Blood pressure (!) 143/82, pulse 95, temperature 99.2 F (37.3 C), resp. rate 17, height 5\' 7"  (1.702 m), weight 81.6 kg, SpO2 95 %.Body mass index is 28.19 kg/m.  General Appearance: Fairly Groomed  Engineer, water::  Good  Speech:  Clear and Coherent and Normal Rate  Volume:  Normal  Mood:  Euthymic and he is not irritable and appropriately interactive during assessment  Affect:  Appropriate and Congruent  Thought Process:  Coherent, Goal Directed and Descriptions of Associations: Intact  Orientation:  Full (Time, Place, and Person)  Thought Content:  Logical  Suicidal Thoughts:  No  Homicidal Thoughts:  No  Memory:  Immediate;   Good Recent;   Good Remote;   Good  Judgement:  Fair, in setting of chronic alcohol use  Insight:  Fair, in the setting of chronic alcohol use   Psychomotor Activity:  Normal  Concentration:  Fair  Recall:  Good  Fund of Knowledge:Good  Language: Good  Akathisia:  NA  Handed:  Right  AIMS (if indicated):     Assets:  Communication Skills Resilience  Sleep:   >6 hours  Cognition: WNL  ADL's:  Intact   Mental Status Per Nursing Assessment::   On Admission:     Demographic Factors:  Male, Low socioeconomic status and Living alone  Loss Factors: Financial problems/change in socioeconomic status  Historical Factors: NA  Risk Reduction Factors:   Sense of responsibility to family  Continued Clinical Symptoms:  Alcohol/Substance Abuse/Dependencies  Cognitive Features That Contribute To Risk:  None    Suicide Risk:  Minimal: No identifiable suicidal ideation.  Patients presenting with no risk factors but with morbid ruminations; may be classified as minimal risk based on the severity of the  depressive symptoms   Plan Of Care/Follow-up recommendations:  Plan- As per above assessment, there are no current grounds for involuntary commitment at this time.?  Patient is not currently interested in inpatient services, but expresses agreement to continue outpatient treatment - we have reviewed importance of substance abuse abstinence, potential negative impact substance abuse can have on his relationships and level of functioning, and importance of medication compliance. ? Asking SW, Mr. Delon Sacramento to provide outpatient resources for homelessness as well as SUD treatment resources.    Disposition: No evidence of imminent risk to self or others at present.   Patient does not meet criteria for psychiatric inpatient admission. Supportive therapy provided about ongoing stressors. Discussed crisis plan, support from social network, calling 911, coming to the Emergency Department, and calling Suicide Hotline.   Communicated disposition with Carlyon Prows, who agrees to provide resources.     This service was provided via telemedicine using a 2-way, interactive audio and video technology.  Patient location: [name] Provider location: virtual home office  Names of all persons participating in this telemedicine service and their role in this encounter. Name: Jomarion Mish Role: Patient  Name: Merlyn Lot Role: PMHNP   Mallie Darting, NP 05/31/2020, 10:16 AM

## 2020-05-31 NOTE — Progress Notes (Signed)
Per Duard Brady, NP patient has been psychiatrically cleared. CSW asked to provide SUD outpatient resources and homeless shelter resources to patient. CSW included requested information in discharge instructions for patient to utilize upon discharge.    Glennie [place], MSW, [place], LCAS-A Phone: [phone] Disposition/TOC

## 2020-05-31 NOTE — ED Provider Notes (Signed)
Care assumed from Dr. Vanita Panda, patient who is intoxicated and claiming suicidal ideation.  Suspicion is that he is only suicidal when drunk.  He will be kept in the ED and evaluated by TTS when sober.  TTS consultation is appreciated.  He will be held overnight for psychiatric evaluation in the morning.   Delora Fuel, MD 84/03/35 [phone]

## 2020-05-31 NOTE — ED Notes (Addendum)
Pt has been sleeping. NAD. Chest rise and fall.

## 2020-06-09 ENCOUNTER — Emergency Department (HOSPITAL_COMMUNITY)
Admission: EM | Admit: 2020-06-09 | Discharge: 2020-06-10 | Disposition: A | Discharge status: Home / Self Care | Payer: Medicaid Other | Attending: Emergency Medicine | Admitting: Emergency Medicine

## 2020-06-09 ENCOUNTER — Other Ambulatory Visit: Payer: Self-pay

## 2020-06-09 ENCOUNTER — Encounter (HOSPITAL_COMMUNITY): Payer: Self-pay

## 2020-06-09 DIAGNOSIS — F17210 Nicotine dependence, cigarettes, uncomplicated: Secondary | ICD-10-CM | POA: Insufficient documentation

## 2020-06-09 DIAGNOSIS — I10 Essential (primary) hypertension: Secondary | ICD-10-CM | POA: Insufficient documentation

## 2020-06-09 DIAGNOSIS — F1024 Alcohol dependence with alcohol-induced mood disorder: Secondary | ICD-10-CM | POA: Insufficient documentation

## 2020-06-09 DIAGNOSIS — Z20822 Contact with and (suspected) exposure to covid-19: Secondary | ICD-10-CM | POA: Insufficient documentation

## 2020-06-09 DIAGNOSIS — F332 Major depressive disorder, recurrent severe without psychotic features: Secondary | ICD-10-CM | POA: Insufficient documentation

## 2020-06-09 DIAGNOSIS — R45851 Suicidal ideations: Secondary | ICD-10-CM | POA: Insufficient documentation

## 2020-06-09 NOTE — ED Notes (Signed)
Pt belongings place in triage nursing station patient cabinet. Pt has 2 belonging bags.

## 2020-06-09 NOTE — ED Triage Notes (Signed)
Pt arrives EMS with c/o seizure and suicidal ideation. Pt sts he has a plan to cut wrists with a pocket knife. Pt arrives with the knife in his possesion. Admits to drinking 6; 40oz of beer pta.

## 2020-06-09 NOTE — ED Provider Notes (Signed)
[facility] DEPT Provider Note   CSN: [id number] Arrival date & time: 06/09/20  2100     History Chief Complaint  Patient presents with  . Suicidal    Barry Moore is a 55 y.o. male.  Patient presents to ER chief complaint of suicidal thoughts and depression.  He states that he feels like he wants to cut his wrists.  Otherwise denies headache or chest pain abdominal pain.  No fever no cough no vomiting or diarrhea.         Past Medical History:  Diagnosis Date  . Alcoholic hepatitis   . Depression   . ETOH abuse   . Gastric bleed 08/2018  . Hypertension   . Pancreatitis   . Seizures ([place])   . Suicidal behavior     Patient Active Problem List   Diagnosis Date Noted  . Suicidal ideations 05/31/2020  . Nicotine dependence, cigarettes, uncomplicated 98/92/1194  . Alcohol abuse with intoxication ([place]) 07/19/2019  . Orthostatic syncope 07/19/2019  . Upper GI bleeding 07/19/2019  . Acute upper GI bleeding 07/02/2019  . Duodenitis   . Acute upper GI bleed 01/01/2019  . Hypokalemia 01/01/2019  . Alcohol dependence ([place]) 01/01/2019  . Alcoholic liver disease ([place]) 01/01/2019  . Acute pancreatitis 12/13/2018  . Free intraperitoneal air 12/13/2018  . Homicidal ideation   . Gastrointestinal hemorrhage with melena 08/31/2018  . Colon ulcer   . Polyp of ascending colon   . Acute GI bleeding 08/05/2018  . Transaminitis 08/05/2018  . Abdominal pain 08/05/2018  . Nausea and vomiting 08/05/2018  . Substance induced mood disorder ([place]) 07/16/2017  . Severe recurrent major depression without psychotic features ([place]) 05/15/2017  . Microcytic anemia 03/18/2017  . Acute blood loss anemia 02/21/2017  . Upper GI bleed 02/08/2017  . Alcohol withdrawal ([place]) 02/08/2017  . Cocaine abuse ([place]) 11/18/2016  . GI bleed 11/01/2016  . Alcohol abuse   . Major depressive disorder, recurrent severe without psychotic features ([place]) 07/06/2016  .  Alcohol-induced mood disorder ([place]) 06/15/2016  . Seizure ([place]) 05/26/2016  . Tobacco abuse 05/26/2016  . Homeless 05/26/2016  . Essential hypertension 05/26/2016  . Alcohol dependence with alcohol-induced mood disorder ([place]) 02/14/2016  . Severe alcohol withdrawal without perceptual disturbances without complication ([place]) 17/40/8144  . Alcoholic hepatitis without ascites 02/14/2016  . Thrombocytopenia ([place]) 02/14/2016    Past Surgical History:  Procedure Laterality Date  . BIOPSY  08/05/2018   Procedure: BIOPSY;  Surgeon: Rush Landmark Telford Nab., MD;  Location: [facility];  Service: Gastroenterology;;  . BIOPSY  08/07/2018   Procedure: BIOPSY;  Surgeon: Thornton Park, MD;  [facility] ENDOSCOPY;  Service: Gastroenterology;;  . BIOPSY  01/02/2019   Procedure: BIOPSY;  Surgeon: Gatha Mayer, MD;  Location: [facility] ENDOSCOPY;  Service: Endoscopy;;  . COLONOSCOPY WITH PROPOFOL N/A 08/07/2018   Procedure: COLONOSCOPY WITH PROPOFOL;  Surgeon: Thornton Park, MD;  Location: [facility];  Service: Gastroenterology;  Laterality: N/A;  . ESOPHAGOGASTRODUODENOSCOPY N/A 01/02/2019   Procedure: ESOPHAGOGASTRODUODENOSCOPY (EGD);  Surgeon: Gatha Mayer, MD;  [facility];  Service: Endoscopy;  Laterality: N/A;  . ESOPHAGOGASTRODUODENOSCOPY N/A 07/03/2019   Procedure: ESOPHAGOGASTRODUODENOSCOPY (EGD);  Surgeon: Wonda Horner, MD;  Location[facility];  Service: Endoscopy;  Laterality: N/A;  . ESOPHAGOGASTRODUODENOSCOPY (EGD) WITH PROPOFOL N/A 11/02/2016   Procedure: ESOPHAGOGASTRODUODENOSCOPY (EGD) WITH PROPOFOL;  Surgeon: Carol Ada, MD;  Location: [facility] ENDOSCOPY;  Service: Endoscopy;  Laterality: N/A;  . ESOPHAGOGASTRODUODENOSCOPY (EGD) WITH PROPOFOL N/A 08/05/2018   Procedure: ESOPHAGOGASTRODUODENOSCOPY (EGD)  WITH PROPOFOL;  Surgeon: Mansouraty, Telford Nab., MD;  Location: [facility];  Service: Gastroenterology;  Laterality: N/A;  . ESOPHAGOGASTRODUODENOSCOPY (EGD) WITH  PROPOFOL N/A 07/14/2019   Procedure: ESOPHAGOGASTRODUODENOSCOPY (EGD) WITH PROPOFOL;  Surgeon: Otis Brace, MD;  Location: [facility] ENDOSCOPY;  Service: Gastroenterology;  Laterality: N/A;  . HERNIA REPAIR    . LEG SURGERY    . POLYPECTOMY  08/07/2018   Procedure: POLYPECTOMY;  Surgeon: Thornton Park, MD;  [facility] END[facility]COPY;  Service: Gastroenterology;;       Family History  Problem Relation Age of Onset  . Diabetes Mother   . Alcoholism Mother   . Emphysema Father   . Lung cancer Father   . Alcoholism Father     Social History   Tobacco Use  . Smoking status: Current Every Day Smoker    Packs/day: 1.00    Types: Cigarettes  . Smokeless tobacco: Never Used  Vaping Use  . Vaping Use: Never used  Substance Use Topics  . Alcohol use: Yes    Comment: 5+ BEERS DAILY, Patient does not know how much he drank tonight  . Drug use: Not Currently    Frequency: 3.0 times per week    Types: Cocaine    Home Medications Prior to Admission medications   Medication Sig Start Date End Date Taking? Authorizing Provider  ferrous sulfate 325 (65 FE) MG tablet Take 1 tablet (325 mg total) by mouth daily with breakfast. Patient not taking: Reported on 09/23/2018 08/09/18 10/09/18  Elodia [place]., MD    Allergies    Tomato, Aspirin, and Sulfa antibiotics  Review of Systems   Review of Systems  Constitutional: Negative for fever.  HENT: Negative for ear pain and sore throat.   Eyes: Negative for pain.  Respiratory: Negative for cough.   Cardiovascular: Negative for chest pain.  Gastrointestinal: Negative for abdominal pain.  Genitourinary: Negative for flank pain.  Musculoskeletal: Negative for back pain.  Skin: Negative for color change and rash.  Neurological: Negative for syncope.  All other systems reviewed and are negative.   Physical Exam Updated Vital Signs BP 132/85 (BP Location: Left Arm)   Pulse 97   Temp 98.7 F (37.1 C) (Oral)   Resp 17   Ht 5\' 7"   (1.702 m)   Wt 68 kg   SpO2 98%   BMI 23.49 kg/m   Physical Exam Constitutional:      General: He is not in acute distress.    Appearance: He is well-developed.  HENT:     Head: Normocephalic.     Nose: Nose normal.  Eyes:     Extraocular Movements: Extraocular movements intact.  Cardiovascular:     Rate and Rhythm: Normal rate.  Pulmonary:     Effort: Pulmonary effort is normal.  Skin:    Coloration: Skin is not jaundiced.  Neurological:     Mental Status: He is alert. Mental status is at baseline.     ED Results / Procedures / Treatments   Labs (all labs ordered are listed, but only abnormal results are displayed) Labs Reviewed  COMPREHENSIVE METABOLIC PANEL  ETHANOL  SALICYLATE LEVEL  ACETAMINOPHEN LEVEL  CBC  RAPID URINE DRUG SCREEN, HOSP PERFORMED    EKG None  Radiology No results found.  Procedures Procedures   Medications Ordered in ED Medications - No data to display  ED Course  I have reviewed the triage vital signs and the nursing notes.  Pertinent labs & imaging results that were available during my  care of the patient were reviewed by me and considered in my medical decision making (see chart for details).    MDM Rules/Calculators/A&P                          Labs for TTA screening ordered.  Patient will be signed out to oncoming physician provider.   Final Clinical Impression(s) / ED Diagnoses Final diagnoses:  Suicidal ideation    Rx / DC Orders ED Discharge Orders    None       Luna Fuse, MD 06/09/20 2228

## 2020-06-09 NOTE — ED Notes (Signed)
Belongings searched by security. No knife was found.

## 2020-06-10 LAB — CBC
HCT: 30.8 % — ABNORMAL LOW (ref 39.0–52.0)
Hemoglobin: 9.1 g/dL — ABNORMAL LOW (ref 13.0–17.0)
MCH: 21.7 pg — ABNORMAL LOW (ref 26.0–34.0)
MCHC: 29.5 g/dL — ABNORMAL LOW (ref 30.0–36.0)
MCV: 73.5 fL — ABNORMAL LOW (ref 80.0–100.0)
Platelets: 133 10*3/uL — ABNORMAL LOW (ref 150–400)
RBC: 4.19 MIL/uL — ABNORMAL LOW (ref 4.22–5.81)
RDW: 19.7 % — ABNORMAL HIGH (ref 11.5–15.5)
WBC: 4.3 10*3/uL (ref 4.0–10.5)
nRBC: 0 % (ref 0.0–0.2)

## 2020-06-10 LAB — RAPID URINE DRUG SCREEN, HOSP PERFORMED
Amphetamines: NOT DETECTED
Barbiturates: NOT DETECTED
Benzodiazepines: NOT DETECTED
Cocaine: NOT DETECTED
Opiates: NOT DETECTED
Tetrahydrocannabinol: NOT DETECTED

## 2020-06-10 LAB — COMPREHENSIVE METABOLIC PANEL
ALT: 59 U/L — ABNORMAL HIGH (ref 0–44)
AST: 122 U/L — ABNORMAL HIGH (ref 15–41)
Albumin: 4.4 g/dL (ref 3.5–5.0)
Alkaline Phosphatase: 118 U/L (ref 38–126)
Anion gap: 13 (ref 5–15)
BUN: 8 mg/dL (ref 6–20)
CO2: 21 mmol/L — ABNORMAL LOW (ref 22–32)
Calcium: 9.1 mg/dL (ref 8.9–10.3)
Chloride: 106 mmol/L (ref 98–111)
Creatinine, Ser: 0.83 mg/dL (ref 0.61–1.24)
GFR, Estimated: >60 mL/min (ref >60)
Glucose, Bld: 94 mg/dL (ref 70–99)
Potassium: 4 mmol/L (ref 3.5–5.1)
Sodium: 140 mmol/L (ref 135–145)
Total Bilirubin: 0.6 mg/dL (ref 0.3–1.2)
Total Protein: 8.7 g/dL — ABNORMAL HIGH (ref 6.5–8.1)

## 2020-06-10 LAB — RESP PANEL BY RT-PCR (FLU A&B, COVID) ARPGX2
Influenza A by PCR: NEGATIVE
Influenza B by PCR: NEGATIVE
SARS Coronavirus 2 by RT PCR: NEGATIVE

## 2020-06-10 LAB — ACETAMINOPHEN LEVEL: Acetaminophen (Tylenol), Serum: <10 ug/mL — ABNORMAL LOW (ref 10–30)

## 2020-06-10 LAB — ETHANOL: Alcohol, Ethyl (B): 265 mg/dL — ABNORMAL HIGH (ref <10)

## 2020-06-10 LAB — SALICYLATE LEVEL: Salicylate Lvl: <7 mg/dL — ABNORMAL LOW (ref 7.0–30.0)

## 2020-06-10 NOTE — Discharge Instructions (Signed)
For your behavioral health needs, you are advised to follow up with [facility]:      [facility]      [address]      [place], [place] [address]      [phone]      They offer psychiatry/medication management, therapy and substance abuse treatment.  New patients are being seen in their walk-in clinic.  Walk-in hours are Monday - Thursday from 8:00 am - 11:00 am for psychiatry, and Friday from 1:00 pm - 4:00 pm for therapy.  Walk-in patients are seen on a first come, first served basis, so try to arrive as early as possible for the best chance of being seen the same day.  

## 2020-06-10 NOTE — BH Assessment (Signed)
[place] Assessment Progress Note  Per [name] NP, this voluntary pt does not require psychiatric hospitalization at this time.  Pt is psychiatrically cleared.  Pt reportedly does not want outpatient referrals, but discharge instructions include referral information for [facility] in case he rethinks this decision.  EDP Lorre Munroe, MD and pt's nurse, Adela Lank, have been notified.  Jalene Mullet, [place] Triage Specialist [phone]

## 2020-06-10 NOTE — ED Provider Notes (Signed)
Emergency Medicine Observation Re-evaluation Note  Jaylee Lantry is a 55 y.o. male, seen on rounds today.  Pt initially presented to the ED for complaints of Suicidal   Physical Exam  BP 132/85 (BP Location: Left Arm)   Pulse 97   Temp 98.7 F (37.1 C) (Oral)   Resp 17   Ht 5\' 7"  (1.702 m)   Wt 68 kg   SpO2 98%   BMI 23.49 kg/m  Physical Exam General: nad Lungs: no distress Psych: cooperative  ED Course / MDM  EKG:   I have reviewed the labs performed to date as well as medications administered while in observation.  Recent changes in the last 24 hours include none.  Patient was seen by [facility].  They recommended discharge.  The patient does have anemia and thrombocytopenia both of which appear consistent with his recent labs.  Plan  Current plan is for discharge. Patient is not under full IVC at this time.   Arnaldo [place], MD 06/10/20 [phone]
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
## 2020-06-17 ENCOUNTER — Other Ambulatory Visit: Payer: Self-pay

## 2020-06-17 ENCOUNTER — Encounter (HOSPITAL_COMMUNITY): Payer: Self-pay

## 2020-06-17 ENCOUNTER — Emergency Department (HOSPITAL_COMMUNITY)
Admission: EM | Admit: 2020-06-17 | Discharge: 2020-06-18 | Disposition: A | Discharge status: Home / Self Care | Payer: Medicaid Other | Attending: Emergency Medicine | Admitting: Emergency Medicine

## 2020-06-17 DIAGNOSIS — Z85038 Personal history of other malignant neoplasm of large intestine: Secondary | ICD-10-CM | POA: Insufficient documentation

## 2020-06-17 DIAGNOSIS — F1020 Alcohol dependence, uncomplicated: Secondary | ICD-10-CM | POA: Insufficient documentation

## 2020-06-17 DIAGNOSIS — Y908 Blood alcohol level of 240 mg/100 ml or more: Secondary | ICD-10-CM | POA: Insufficient documentation

## 2020-06-17 DIAGNOSIS — I10 Essential (primary) hypertension: Secondary | ICD-10-CM | POA: Insufficient documentation

## 2020-06-17 DIAGNOSIS — F17210 Nicotine dependence, cigarettes, uncomplicated: Secondary | ICD-10-CM | POA: Insufficient documentation

## 2020-06-17 DIAGNOSIS — R45851 Suicidal ideations: Secondary | ICD-10-CM | POA: Insufficient documentation

## 2020-06-17 DIAGNOSIS — Z20822 Contact with and (suspected) exposure to covid-19: Secondary | ICD-10-CM | POA: Insufficient documentation

## 2020-06-17 DIAGNOSIS — F10920 Alcohol use, unspecified with intoxication, uncomplicated: Secondary | ICD-10-CM

## 2020-06-17 DIAGNOSIS — F10220 Alcohol dependence with intoxication, uncomplicated: Secondary | ICD-10-CM | POA: Insufficient documentation

## 2020-06-17 DIAGNOSIS — F332 Major depressive disorder, recurrent severe without psychotic features: Secondary | ICD-10-CM

## 2020-06-17 DIAGNOSIS — R45850 Homicidal ideations: Secondary | ICD-10-CM | POA: Insufficient documentation

## 2020-06-17 LAB — COMPREHENSIVE METABOLIC PANEL
ALT: 67 U/L — ABNORMAL HIGH (ref 0–44)
AST: 166 U/L — ABNORMAL HIGH (ref 15–41)
Albumin: 4.4 g/dL (ref 3.5–5.0)
Alkaline Phosphatase: 131 U/L — ABNORMAL HIGH (ref 38–126)
Anion gap: 11 (ref 5–15)
BUN: 9 mg/dL (ref 6–20)
CO2: 24 mmol/L (ref 22–32)
Calcium: 9.1 mg/dL (ref 8.9–10.3)
Chloride: 104 mmol/L (ref 98–111)
Creatinine, Ser: 0.83 mg/dL (ref 0.61–1.24)
GFR, Estimated: >60 mL/min (ref >60)
Glucose, Bld: 93 mg/dL (ref 70–99)
Potassium: 3.9 mmol/L (ref 3.5–5.1)
Sodium: 139 mmol/L (ref 135–145)
Total Bilirubin: 0.9 mg/dL (ref 0.3–1.2)
Total Protein: 8.4 g/dL — ABNORMAL HIGH (ref 6.5–8.1)

## 2020-06-17 LAB — CBC
HCT: 30.1 % — ABNORMAL LOW (ref 39.0–52.0)
Hemoglobin: 9.1 g/dL — ABNORMAL LOW (ref 13.0–17.0)
MCH: 21.7 pg — ABNORMAL LOW (ref 26.0–34.0)
MCHC: 30.2 g/dL (ref 30.0–36.0)
MCV: 71.8 fL — ABNORMAL LOW (ref 80.0–100.0)
Platelets: 133 10*3/uL — ABNORMAL LOW (ref 150–400)
RBC: 4.19 MIL/uL — ABNORMAL LOW (ref 4.22–5.81)
RDW: 18.8 % — ABNORMAL HIGH (ref 11.5–15.5)
WBC: 4.1 10*3/uL (ref 4.0–10.5)
nRBC: 0 % (ref 0.0–0.2)

## 2020-06-17 LAB — RESP PANEL BY RT-PCR (FLU A&B, COVID) ARPGX2
Influenza A by PCR: NEGATIVE
Influenza B by PCR: NEGATIVE
SARS Coronavirus 2 by RT PCR: NEGATIVE

## 2020-06-17 LAB — ACETAMINOPHEN LEVEL: Acetaminophen (Tylenol), Serum: <10 ug/mL — ABNORMAL LOW (ref 10–30)

## 2020-06-17 LAB — ETHANOL: Alcohol, Ethyl (B): 378 mg/dL (ref <10)

## 2020-06-17 LAB — SALICYLATE LEVEL: Salicylate Lvl: <7 mg/dL — ABNORMAL LOW (ref 7.0–30.0)

## 2020-06-17 MED ORDER — LORAZEPAM 1 MG PO TABS
0.0000 mg | ORAL_TABLET | Freq: Four times a day (QID) | ORAL | Status: DC
  Administered 2020-06-18: 1 mg via ORAL
  Filled 2020-06-17: qty 1

## 2020-06-17 MED ORDER — THIAMINE HCL 100 MG/ML IJ SOLN: 100.0000 mg | Freq: Every day | INTRAMUSCULAR | Status: DC

## 2020-06-17 MED ORDER — LORAZEPAM 2 MG/ML IJ SOLN: 0.0000 mg | Freq: Two times a day (BID) | INTRAMUSCULAR | Status: DC

## 2020-06-17 MED ORDER — LORAZEPAM 2 MG/ML IJ SOLN: 0.0000 mg | Freq: Four times a day (QID) | INTRAMUSCULAR | Status: DC

## 2020-06-17 MED ORDER — THIAMINE HCL 100 MG PO TABS
100.0000 mg | ORAL_TABLET | Freq: Every day | ORAL | Status: DC
Start: 2020-06-17 — End: 2020-06-18
  Administered 2020-06-17 – 2020-06-18 (×2): 100 mg via ORAL
  Filled 2020-06-17 (×2): qty 1

## 2020-06-17 MED ORDER — LORAZEPAM 1 MG PO TABS: 0.0000 mg | ORAL_TABLET | Freq: Two times a day (BID) | ORAL | Status: DC

## 2020-06-17 NOTE — ED Notes (Signed)
Pt dressed out into burgundy scrubs and wanded by security. 3 bags of belongings placed in cabinet behind nurses station in triage.
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
## 2020-06-18 DIAGNOSIS — F1020 Alcohol dependence, uncomplicated: Secondary | ICD-10-CM

## 2020-06-18 DIAGNOSIS — F332 Major depressive disorder, recurrent severe without psychotic features: Secondary | ICD-10-CM

## 2020-06-18 LAB — RAPID URINE DRUG SCREEN, HOSP PERFORMED
Amphetamines: NOT DETECTED
Barbiturates: NOT DETECTED
Benzodiazepines: NOT DETECTED
Cocaine: NOT DETECTED
Opiates: NOT DETECTED
Tetrahydrocannabinol: NOT DETECTED

## 2020-06-18 MED ORDER — LIDOCAINE 5 % EX PTCH
1.0000 | MEDICATED_PATCH | CUTANEOUS | Status: DC
  Administered 2020-06-18: 1 via TRANSDERMAL
  Filled 2020-06-18: qty 1

## 2020-06-18 NOTE — ED Provider Notes (Signed)
Patient cleared by our behavioral health colleagues.   Carmin Muskrat, MD 06/18/20 [phone]

## 2020-06-18 NOTE — BH Assessment (Signed)
[facility] Assessment Progress Note  Per Lindell Spar NP, this voluntary pt does not require psychiatric hospitalization at this time.  Pt is psychiatrically cleared.   discharge instructions include referral information for [facility].  EDP Carmin Muskrat, MD and pt's nurse, Denton Ar, have been notified.  Jalene Mullet, [place] Triage Specialist [phone]

## 2020-06-18 NOTE — ED Provider Notes (Signed)
Emergency Medicine Observation Re-evaluation Note  Lochlan Grygiel is a 55 y.o. male, seen on rounds today.  Pt initially presented to the ED for complaints of Suicidal, Homicidal, and Alcohol Intoxication Currently, the patient is resting and being allowed to metabolize.  Physical Exam  BP (!) 157/84   Pulse 85   Temp 98.5 F (36.9 C)   Resp 16   Ht 5\' 7"  (1.702 m)   Wt 68 kg   SpO2 97%   BMI 23.49 kg/m  Physical Exam General: resting comfortably, NAD Lungs: normal WOB Psych: currently calm and resting  ED Course / MDM  EKG:   I have reviewed the labs performed to date as well as medications administered while in observation.  Recent changes in the last 24 hours include none.  Plan  Current plan is for patient to metabolize and be reassessed by TTS.   Lorelle Gibbs, [place] 06/18/20 [id number]
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
## 2020-06-18 NOTE — ED Notes (Signed)
Pt woke briefly from sleep to speak with RN. Will reassess.

## 2020-06-18 NOTE — ED Notes (Signed)
Pt ambulated to restroom unassisted 

## 2020-06-18 NOTE — ED Notes (Signed)
TTS in progress 

## 2020-06-18 NOTE — ED Notes (Signed)
Pt woke, tremulous, states hes "not feeling great". See CIWA. Medications administered as order. Pt pending TTS reassess.

## 2020-06-18 NOTE — Consult Note (Signed)
Telepsych Consultation   Reason for Consult: Re-evaluation Referring Physician: [facility] Location of Patient: [facility]  Location of Provider: [facility]  Patient Identification: Barry Moore MRN:  [id number]  Principal Diagnosis: <principal problem not specified>  Diagnosis:  Active Problems:   Severe episode of recurrent major depressive disorder, without psychotic features ([place])   Alcohol use disorder, severe, dependence ([place])   Total Time spent with patient: 30 minutes  Subjective:   Barry Moore is a 55 y.o. male patient admitted with chronic alcoholism.  HPI: (Per previous EDP notes): Barry Elmoreis a 55 y.o.male. Patient with history of alcohol abuse, seizures --presents to the emergency department today for evaluation of alcohol intoxication. Patient was reportedly on [address] with a knife, threatening to kill other people and himself. When asked about this, patient verifies this but states that mainly he wants to hurt himself. EMS take the knife. He admits to drinking alcohol. States that he has been vomiting today. Denies other medical complaints, including pain." On my evaluation, the patient is endorsing back pain and stiffness from sleeping for several hours.  He continues to endorse SI.  Reports that he has been feeling suicidal for the last few days.  He reports that he has had a plan to slit his wrist with a knife.  Per triage note, patient was picked up on the side of the road and had an open knife and was threatening to kill himself and other people.  The knife was taken by EMS.  Patient states that he had plan to use the knife to slit his wrist. "I know the right way to do it if I want to actually kill myself."  He denies HI.  No auditory visual hallucinations. On this follow-up tele-psych consultation: Barry Moore reports, "I was having suicidal ideations yesterday because of the recent death in my family. My wife died. We have been unofficially  separated for a long time. Then, I larnt 2 days ago that she passed. I got feeling upset, then because suicidal. I was also drinking quite a bit. I drank all alcohol that I can in a day. Then the shakes & sweats started which made my mood worse. But, I'm good to go right now. I feel better. I'm not feeling like I was yesterday. No thoughts to harm myself no more. I was not thinking about hurting any one yesterday or today. No voices, nothing like that anyway. I'm ready to get out of here".  Past Psychiatric History: Alcohol use disorder  Risk to Self: Denies Risk to Others: Denies Prior Inpatient Therapy: Yes Prior Outpatient Therapy: Yes.  Past Medical History:  Past Medical History:  Diagnosis Date  . Alcoholic hepatitis   . Depression   . ETOH abuse   . Gastric bleed 08/2018  . Hypertension   . Pancreatitis   . Seizures ([place])   . Suicidal behavior     Past Surgical History:  Procedure Laterality Date  . BIOPSY  08/05/2018   Procedure: BIOPSY;  Surgeon: Rush Landmark Telford Nab., MD;  Location: [facility];  Service: Gastroenterology;;  . BIOPSY  08/07/2018   Procedure: BIOPSY;  Surgeon: Thornton Park, MD;  Location: [facility] ENDOSCOPY;  Service: Gastroenterology;;  . BIOPSY  01/02/2019   Procedure: BIOPSY;  Surgeon: Gatha Mayer, MD;  [facility];  Service: Endoscopy;;  . COLONOSCOPY WITH PROPOFOL N/A 08/07/2018   Procedure: COLONOSCOPY WITH PROPOFOL;  Surgeon: Thornton Park, MD;  Location: [facility];  Service: Gastroenterology;  Laterality: N/A;  .  ESOPHAGOGASTRODUODENOSCOPY N/A 01/02/2019   Procedure: ESOPHAGOGASTRODUODENOSCOPY (EGD);  Surgeon: Gatha Mayer, MD;  Location[facility];  Service: Endoscopy;  Laterality: N/A;  . ESOPHAGOGASTRODUODENOSCOPY N/A 07/03/2019   Procedure: ESOPHAGOGASTRODUODENOSCOPY (EGD);  Surgeon: Wonda Horner, MD;  Location: [facility];  Service: Endoscopy;  Laterality: N/A;  . ESOPHAGOGASTRODUODENOSCOPY (EGD) WITH PROPOFOL N/A  11/02/2016   Procedure: ESOPHAGOGASTRODUODENOSCOPY (EGD) WITH PROPOFOL;  Surgeon: Carol Ada, MD;  [facility];  Service: Endoscopy;  Laterality: N/A;  . ESOPHAGOGASTRODUODENOSCOPY (EGD) WITH PROPOFOL N/A 08/05/2018   Procedure: ESOPHAGOGASTRODUODENOSCOPY (EGD) WITH PROPOFOL;  Surgeon: Rush Landmark Telford Nab., MD;  Location: [place];  Service: Gastroenterology;  Laterality: N/A;  . ESOPHAGOGASTRODUODENOSCOPY (EGD) WITH PROPOFOL N/A 07/14/2019   Procedure: ESOPHAGOGASTRODUODENOSCOPY (EGD) WITH PROPOFOL;  Surgeon: Otis Brace, MD;  [facility];  Service: Gastroenterology;  Laterality: N/A;  . HERNIA REPAIR    . LEG SURGERY    . POLYPECTOMY  08/07/2018   Procedure: POLYPECTOMY;  Surgeon: Thornton Park, MD;  Location: [facility] END[facility]COPY;  Service: Gastroenterology;;   Family History:  Family History  Problem Relation Age of Onset  . Diabetes Mother   . Alcoholism Mother   . Emphysema Father   . Lung cancer Father   . Alcoholism Father    Family Psychiatric  History: None reported Social History:  Social History   Substance and Sexual Activity  Alcohol Use Yes   Comment: 5+ BEERS DAILY, Patient does not know how much he drank tonight     Social History   Substance and Sexual Activity  Drug Use Not Currently  . Frequency: 3.0 times per week  . Types: Cocaine    Social History   Socioeconomic History  . Marital status: Divorced    Spouse name: Not on file  . Number of children: Not on file  . Years of education: Not on file  . Highest education level: Not on file  Occupational History  . Not on file  Tobacco Use  . Smoking status: Current Every Day Smoker    Packs/day: 1.00    Types: Cigarettes  . Smokeless tobacco: Never Used  Vaping Use  . Vaping Use: Never used  Substance and Sexual Activity  . Alcohol use: Yes    Comment: 5+ BEERS DAILY, Patient does not know how much he drank tonight  . Drug use: Not Currently    Frequency: 3.0 times  per week    Types: Cocaine  . Sexual activity: Not Currently  Other Topics Concern  . Not on file  Social History Narrative  . Not on file   Social Determinants of Health   Financial Resource Strain: Not on file  Food Insecurity: Not on file  Transportation Needs: Not on file  Physical Activity: Not on file  Stress: Not on file  Social Connections: Not on file   Additional Social History: Allergies:   Allergies  Allergen Reactions  . Tomato Shortness Of Breath and Nausea And Vomiting  . Aspirin Nausea And Vomiting  . Sulfa Antibiotics Other (See Comments)    unknown    Labs:  Results for orders placed or performed during the hospital encounter of 06/17/20 (from the past 48 hour(s))  Rapid urine drug screen (hospital performed)     Status: None   Collection Time: 06/17/20  1:32 AM  Result Value Ref Range   Opiates NONE DETECTED NONE DETECTED   Cocaine NONE DETECTED NONE DETECTED   Benzodiazepines NONE DETECTED NONE DETECTED   Amphetamines NONE DETECTED NONE DETECTED  Tetrahydrocannabinol NONE DETECTED NONE DETECTED   Barbiturates NONE DETECTED NONE DETECTED    Comment: (NOTE) DRUG SCREEN FOR MEDICAL PURPOSES ONLY.  IF CONFIRMATION IS NEEDED FOR ANY PURPOSE, NOTIFY LAB WITHIN 5 DAYS.  LOWEST DETECTABLE LIMITS FOR URINE DRUG SCREEN Drug Class                     Cutoff (ng/mL) Amphetamine and metabolites    1000 Barbiturate and metabolites    200 Benzodiazepine                 169 Tricyclics and metabolites     300 Opiates and metabolites        300 Cocaine and metabolites        300 THC                            50 Performed at [facility], [place] [address] [place], [place] [address]   Comprehensive metabolic panel     Status: Abnormal   Collection Time: 06/17/20  7:22 PM  Result Value Ref Range   Sodium 139 135 - 145 mmol/L   Potassium 3.9 3.5 - 5.1 mmol/L   Chloride 104 98 - 111 mmol/L   CO2 24 22 - 32 mmol/L   Glucose, Bld 93 70 -  99 mg/dL    Comment: Glucose reference range applies only to samples taken after fasting for at least 8 hours.   BUN 9 6 - 20 mg/dL   Creatinine, Ser 0.83 0.61 - 1.24 mg/dL   Calcium 9.1 8.9 - 10.3 mg/dL   Total Protein 8.4 (H) 6.5 - 8.1 g/dL   Albumin 4.4 3.5 - 5.0 g/dL   AST 166 (H) 15 - 41 U/L   ALT 67 (H) 0 - 44 U/L   Alkaline Phosphatase 131 (H) 38 - 126 U/L   Total Bilirubin 0.9 0.3 - 1.2 mg/dL   GFR, Estimated >60 >60 mL/min    Comment: (NOTE) Calculated using the CKD-EPI Creatinine Equation (2021)    Anion gap 11 5 - 15    Comment: Performed at [facility], [place] [address] [place], [place] [address]  Ethanol     Status: Abnormal   Collection Time: 06/17/20  7:22 PM  Result Value Ref Range   Alcohol, Ethyl (B) 378 (HH) <10 mg/dL    Comment: CRITICAL RESULT CALLED TO, READ BACK BY AND VERIFIED WITH: SMITH,J. R @2055  ON 03.29.2022 BY COHEN,K (NOTE) Lowest detectable limit for serum alcohol is 10 mg/dL.  For medical purposes only. Performed at [facility], [place] [address], [place], [place] [address]   Salicylate level     Status: Abnormal   Collection Time: 06/17/20  7:22 PM  Result Value Ref Range   Salicylate Lvl <8.5 (L) 7.0 - 30.0 mg/dL    Comment: Performed at [facility], [place] [address], [place],  [address]  Acetaminophen level     Status: Abnormal   Collection Time: 06/17/20  7:22 PM  Result Value Ref Range   Acetaminophen (Tylenol), Serum <10 (L) 10 - 30 ug/mL    Comment: (NOTE) Therapeutic concentrations vary significantly. A range of 10-30 ug/mL  may be an effective concentration for many patients. However, some  are best treated at concentrations outside of this range. Acetaminophen concentrations >150 ug/mL at 4 hours after ingestion  and >50 ug/mL at 12 hours after ingestion are often associated  with  toxic reactions.  Performed at [facility], [place] [address], [place], [place] [address]   cbc     Status: Abnormal   Collection Time: 06/17/20  7:22 PM  Result Value Ref Range   WBC 4.1 4.0 - 10.5 K/uL   RBC 4.19 (L) 4.22 - 5.81 MIL/uL   Hemoglobin 9.1 (L) 13.0 - 17.0 g/dL   HCT 30.1 (L) 39.0 - 52.0 %   MCV 71.8 (L) 80.0 - 100.0 fL   MCH 21.7 (L) 26.0 - 34.0 pg   MCHC 30.2 30.0 - 36.0 g/dL   RDW 18.8 (H) 11.5 - 15.5 %   Platelets 133 (L) 150 - 400 K/uL   nRBC 0.0 0.0 - 0.2 %    Comment: Performed at [facility] [place] [address], [place], [place] [address]  Resp Panel by RT-PCR (Flu A&B, Covid) Nasopharyngeal Swab     Status: None   Collection Time: 06/17/20  7:30 PM   Specimen: Nasopharyngeal Swab; Nasopharyngeal(NP) swabs in vial transport medium  Result Value Ref Range   SARS Coronavirus 2 by RT PCR NEGATIVE NEGATIVE    Comment: (NOTE) SARS-CoV-2 target nucleic acids are NOT DETECTED.  The SARS-CoV-2 RNA is generally detectable in upper respiratory specimens during the acute phase of infection. The lowest concentration of SARS-CoV-2 viral copies this assay can detect is 138 copies/mL. A negative result does not preclude SARS-Cov-2 infection and should not be used as the sole basis for treatment or other patient management decisions. A negative result may occur with  improper specimen collection/handling, submission of specimen other than nasopharyngeal swab, presence of viral mutation(s) within the areas targeted by this assay, and inadequate number of viral copies(<138 copies/mL). A negative result must be combined with clinical observations, patient history, and epidemiological information. The expected result is Negative.  Fact Sheet for Patients:  EntrepreneurPulse.com.au  Fact Sheet for Healthcare Providers:  IncredibleEmployment.be  This test is no t yet approved or cleared by the [place] FDA and  has been authorized for detection and/or diagnosis of SARS-CoV-2  by FDA under an Emergency Use Authorization (EUA). This EUA will remain  in effect (meaning this test can be used) for the duration of the COVID-19 declaration under Section 564(b)(1) of the Act, 21 U.S.C.section 360bbb-3(b)(1), unless the authorization is terminated  or revoked sooner.       Influenza A by PCR NEGATIVE NEGATIVE   Influenza B by PCR NEGATIVE NEGATIVE    Comment: (NOTE) The Xpert Xpress SARS-CoV-2/FLU/RSV plus assay is intended as an aid in the diagnosis of influenza from Nasopharyngeal swab specimens and should not be used as a sole basis for treatment. Nasal washings and aspirates are unacceptable for Xpert Xpress SARS-CoV-2/FLU/RSV testing.  Fact Sheet for Patients: EntrepreneurPulse.com.au  Fact Sheet for Healthcare Providers: IncredibleEmployment.be  This test is not yet approved or cleared by the [place] FDA and has been authorized for detection and/or diagnosis of SARS-CoV-2 by FDA under an Emergency Use Authorization (EUA). This EUA will remain in effect (meaning this test can be used) for the duration of the COVID-19 declaration under Section 564(b)(1) of the Act, 21 U.S.C. section 360bbb-3(b)(1), unless the authorization is terminated or revoked.  Performed at [facility], [place] [address], [place], [place] [address]     Medications:  Current Facility-Administered Medications  Medication Dose Route Frequency Provider Last Rate Last Admin  . lidocaine (LIDODERM) 5 % 1 patch  1 patch Transdermal Q24H McDonald, Mia  A, PA-C   1 patch at 06/18/20 0138  . LORazepam (ATIVAN) injection 0-4 mg  0-4 mg Intravenous Q6H Carlisle Cater, PA-C       Or  . LORazepam (ATIVAN) tablet 0-4 mg  0-4 mg Oral Q6H Carlisle Cater, PA-C   1 mg at 06/18/20 4496  . [START ON 06/20/2020] LORazepam (ATIVAN) injection 0-4 mg  0-4 mg Intravenous Q12H Carlisle Cater, PA-C       Or  . Derrill Memo ON 06/20/2020] LORazepam (ATIVAN)  tablet 0-4 mg  0-4 mg Oral Q12H Geiple, Joshua, PA-C      . thiamine tablet 100 mg  100 mg Oral Daily Carlisle Cater, PA-C   100 mg at 06/18/20 7591   Or  . thiamine (B-1) injection 100 mg  100 mg Intravenous Daily Carlisle Cater, PA-C       No current outpatient medications on file.   Musculoskeletal: Strength & Muscle Tone: within normal limits Gait & Station: normal Patient leans: N/A  Psychiatric Specialty Exam: Physical Exam Vitals and nursing note reviewed.  HENT:     Head: Normocephalic.     Nose: Nose normal.  Eyes:     Pupils: Pupils are equal, round, and reactive to light.  Cardiovascular:     Rate and Rhythm: Normal rate.  Pulmonary:     Effort: Pulmonary effort is normal.  Genitourinary:    Comments: Deferred Musculoskeletal:        General: Normal range of motion.     Cervical back: Normal range of motion.  Skin:    General: Skin is warm and dry.  Neurological:     General: No focal deficit present.     Mental Status: He is alert and oriented to person, place, and time. Mental status is at baseline.     Review of Systems  Constitutional: Negative.   HENT: Negative.   Eyes: Negative.   Respiratory: Negative.   Cardiovascular: Negative.   Gastrointestinal: Negative.   Endocrine: Negative.   Genitourinary: Negative.   Musculoskeletal: Negative.   Neurological: Negative.   Psychiatric/Behavioral: Negative for agitation, behavioral problems, confusion, decreased concentration, dysphoric mood, hallucinations, self-injury, sleep disturbance and suicidal ideas. The patient is not nervous/anxious and is not hyperactive.     Blood pressure 138/75, pulse 86, temperature 98.5 F (36.9 C), resp. rate 16, height 5\' 7"  (1.702 m), weight 68 kg, SpO2 96 %.Body mass index is 23.49 kg/m.  General Appearance: Disheveled  Eye Contact:  Good  Speech:  Clear and Coherent  Volume:  Normal  Mood:  Euthymic  Affect:  Appropriate  Thought Process:  Coherent and  Descriptions of Associations: Intact  Orientation:  Full (Time, Place, and Person)  Thought Content:  Logical  Suicidal Thoughts:  Denies  Homicidal Thoughts:  Denies  Memory:  Immediate;   Good Recent;   Good Remote;   Good  Judgement:  Good  Insight:  Lacking  Psychomotor Activity:  Normal  Concentration:  Concentration: Good and Attention Span: Good  Recall:  Good  Fund of Knowledge:  Good  Language:  Good  Akathisia:  NA  Handed:  Right  AIMS (if indicated):     Assets:  Communication Skills Resilience  ADL's:  Intact  Cognition:  WNL  Sleep:      Treatment Plan Summary: Daily contact with patient to assess and evaluate symptoms and progress in treatment and Medication management  Disposition: No evidence of imminent risk to self or others at present.   Patient does not meet criteria  for psychiatric inpatient admission. Discussed crisis plan, support from social network, calling 911, coming to the Emergency Department, and calling Suicide Hotline.  This service was provided via telemedicine using a 2-way, interactive audio and video technology.  Names of all persons participating in this telemedicine service and their role in this encounter. Name: Lindell Spar Role: PMHNP    Lindell Spar, NP, [place] 06/18/2020 7:36 PM

## 2020-06-18 NOTE — Discharge Instructions (Signed)
For your behavioral health needs, you are advised to follow up with [place]:      [name]      [place], [place] [address]      [phone]      They offer psychiatry/medication management, therapy and substance abuse treatment.  New patients are being seen in their walk-in clinic.  Walk-in hours are Monday - Thursday from 8:00 am - 11:00 am for psychiatry, and Friday from 1:00 pm - 4:00 pm for therapy.  Walk-in patients are seen on a first come, first served basis, so try to arrive as early as possible for the best chance of being seen the same day.

## 2020-06-24 ENCOUNTER — Other Ambulatory Visit: Payer: Self-pay

## 2020-06-24 ENCOUNTER — Inpatient Hospital Stay (HOSPITAL_COMMUNITY)
Admission: EM | Admit: 2020-06-24 | Discharge: 2020-06-27 | DRG: 377 | Disposition: A | Discharge status: Home / Self Care | Payer: Self-pay | Attending: Internal Medicine | Admitting: Internal Medicine

## 2020-06-24 DIAGNOSIS — R569 Unspecified convulsions: Secondary | ICD-10-CM | POA: Diagnosis present

## 2020-06-24 DIAGNOSIS — F1010 Alcohol abuse, uncomplicated: Secondary | ICD-10-CM | POA: Diagnosis present

## 2020-06-24 DIAGNOSIS — Z20822 Contact with and (suspected) exposure to covid-19: Secondary | ICD-10-CM | POA: Diagnosis present

## 2020-06-24 DIAGNOSIS — D696 Thrombocytopenia, unspecified: Secondary | ICD-10-CM | POA: Diagnosis present

## 2020-06-24 DIAGNOSIS — Z886 Allergy status to analgesic agent status: Secondary | ICD-10-CM

## 2020-06-24 DIAGNOSIS — K7010 Alcoholic hepatitis without ascites: Secondary | ICD-10-CM | POA: Diagnosis present

## 2020-06-24 DIAGNOSIS — Z5902 Unsheltered homelessness: Secondary | ICD-10-CM

## 2020-06-24 DIAGNOSIS — Z720 Tobacco use: Secondary | ICD-10-CM | POA: Diagnosis present

## 2020-06-24 DIAGNOSIS — Z811 Family history of alcohol abuse and dependence: Secondary | ICD-10-CM

## 2020-06-24 DIAGNOSIS — K922 Gastrointestinal hemorrhage, unspecified: Principal | ICD-10-CM

## 2020-06-24 DIAGNOSIS — F10229 Alcohol dependence with intoxication, unspecified: Secondary | ICD-10-CM | POA: Diagnosis present

## 2020-06-24 DIAGNOSIS — K7460 Unspecified cirrhosis of liver: Secondary | ICD-10-CM | POA: Diagnosis present

## 2020-06-24 DIAGNOSIS — D62 Acute posthemorrhagic anemia: Secondary | ICD-10-CM | POA: Diagnosis present

## 2020-06-24 DIAGNOSIS — K209 Esophagitis, unspecified without bleeding: Secondary | ICD-10-CM | POA: Diagnosis present

## 2020-06-24 DIAGNOSIS — Z833 Family history of diabetes mellitus: Secondary | ICD-10-CM

## 2020-06-24 DIAGNOSIS — Y908 Blood alcohol level of 240 mg/100 ml or more: Secondary | ICD-10-CM | POA: Diagnosis present

## 2020-06-24 DIAGNOSIS — Z825 Family history of asthma and other chronic lower respiratory diseases: Secondary | ICD-10-CM

## 2020-06-24 DIAGNOSIS — Z91018 Allergy to other foods: Secondary | ICD-10-CM

## 2020-06-24 DIAGNOSIS — Z882 Allergy status to sulfonamides status: Secondary | ICD-10-CM

## 2020-06-24 DIAGNOSIS — Z801 Family history of malignant neoplasm of trachea, bronchus and lung: Secondary | ICD-10-CM

## 2020-06-24 DIAGNOSIS — I10 Essential (primary) hypertension: Secondary | ICD-10-CM | POA: Diagnosis present

## 2020-06-24 DIAGNOSIS — D500 Iron deficiency anemia secondary to blood loss (chronic): Secondary | ICD-10-CM

## 2020-06-24 DIAGNOSIS — K766 Portal hypertension: Secondary | ICD-10-CM | POA: Diagnosis present

## 2020-06-24 DIAGNOSIS — F17210 Nicotine dependence, cigarettes, uncomplicated: Secondary | ICD-10-CM | POA: Diagnosis present

## 2020-06-24 DIAGNOSIS — K2981 Duodenitis with bleeding: Principal | ICD-10-CM | POA: Diagnosis present

## 2020-06-24 DIAGNOSIS — K3189 Other diseases of stomach and duodenum: Secondary | ICD-10-CM | POA: Diagnosis present

## 2020-06-24 DIAGNOSIS — I8511 Secondary esophageal varices with bleeding: Secondary | ICD-10-CM | POA: Diagnosis present

## 2020-06-24 DIAGNOSIS — D6959 Other secondary thrombocytopenia: Secondary | ICD-10-CM | POA: Diagnosis present

## 2020-06-24 LAB — CBC WITH DIFFERENTIAL/PLATELET
Abs Immature Granulocytes: 0.01 10*3/uL (ref 0.00–0.07)
Basophils Absolute: 0.1 10*3/uL (ref 0.0–0.1)
Basophils Relative: 2 %
Eosinophils Absolute: 0.2 10*3/uL (ref 0.0–0.5)
Eosinophils Relative: 4 %
HCT: 29 % — ABNORMAL LOW (ref 39.0–52.0)
Hemoglobin: 8.8 g/dL — ABNORMAL LOW (ref 13.0–17.0)
Immature Granulocytes: 0 %
Lymphocytes Relative: 47 %
Lymphs Abs: 1.8 10*3/uL (ref 0.7–4.0)
MCH: 21.8 pg — ABNORMAL LOW (ref 26.0–34.0)
MCHC: 30.3 g/dL (ref 30.0–36.0)
MCV: 71.8 fL — ABNORMAL LOW (ref 80.0–100.0)
Monocytes Absolute: 0.6 10*3/uL (ref 0.1–1.0)
Monocytes Relative: 15 %
Neutro Abs: 1.2 10*3/uL — ABNORMAL LOW (ref 1.7–7.7)
Neutrophils Relative %: 32 %
Platelets: 88 10*3/uL — ABNORMAL LOW (ref 150–400)
RBC: 4.04 MIL/uL — ABNORMAL LOW (ref 4.22–5.81)
RDW: 19.9 % — ABNORMAL HIGH (ref 11.5–15.5)
WBC: 3.8 10*3/uL — ABNORMAL LOW (ref 4.0–10.5)
nRBC: 0 % (ref 0.0–0.2)

## 2020-06-24 LAB — POC OCCULT BLOOD, ED: Fecal Occult Bld: POSITIVE — AB

## 2020-06-24 LAB — CBG MONITORING, ED: Glucose-Capillary: 124 mg/dL — ABNORMAL HIGH (ref 70–99)

## 2020-06-24 LAB — PROTIME-INR
INR: 1 (ref 0.8–1.2)
Prothrombin Time: 12.8 seconds (ref 11.4–15.2)

## 2020-06-24 MED ORDER — SODIUM CHLORIDE 0.9 % IV SOLN
8.0000 mg/h | INTRAVENOUS | Status: DC
  Administered 2020-06-25 – 2020-06-26 (×3): 8 mg/h via INTRAVENOUS
  Filled 2020-06-24 (×4): qty 80

## 2020-06-24 MED ORDER — SODIUM CHLORIDE 0.9 % IV SOLN: INTRAVENOUS | Status: DC

## 2020-06-24 MED ORDER — SODIUM CHLORIDE 0.9 % IV SOLN
80.0000 mg | Freq: Once | INTRAVENOUS | Status: AC
  Administered 2020-06-25: 80 mg via INTRAVENOUS
  Filled 2020-06-24: qty 80

## 2020-06-24 MED ORDER — LORAZEPAM 2 MG/ML IJ SOLN
1.0000 mg | Freq: Once | INTRAMUSCULAR | Status: AC
  Administered 2020-06-24: 1 mg via INTRAVENOUS
  Filled 2020-06-24: qty 1

## 2020-06-24 MED ORDER — SODIUM CHLORIDE 0.9 % IV BOLUS
1000.0000 mL | Freq: Once | INTRAVENOUS | Status: AC
  Administered 2020-06-24: 1000 mL via INTRAVENOUS

## 2020-06-24 NOTE — ED Provider Notes (Signed)
[facility] DEPT Provider Note   CSN: [id number] Arrival date & time: 06/24/20  2213     History Chief Complaint  Patient presents with  . Seizures    Barry Moore is a 55 y.o. male.  Pt presents to the ED today with not feeling well and possible seizure.  Pt is an alcoholic. He said he did not drink as much as he normally does.  He had 2 episodes where he felt confused.  These episodes were not witnessed.  He has had seizure with alcohol withdrawal in the past.  Pt also mentions that he started seeing black stool yesterday.  He has a hx of an upper GI bleed.        Past Medical History:  Diagnosis Date  . Alcoholic hepatitis   . Depression   . ETOH abuse   . Gastric bleed 08/2018  . Hypertension   . Pancreatitis   . Seizures ([place])   . Suicidal behavior     Patient Active Problem List   Diagnosis Date Noted  . Suicidal ideations 05/31/2020  . Nicotine dependence, cigarettes, uncomplicated 42/35/3614  . Alcohol abuse with intoxication ([place]) 07/19/2019  . Orthostatic syncope 07/19/2019  . Upper GI bleeding 07/19/2019  . Acute upper GI bleeding 07/02/2019  . Duodenitis   . Acute upper GI bleed 01/01/2019  . Hypokalemia 01/01/2019  . Alcohol use disorder, severe, dependence ([place]) 01/01/2019  . Alcoholic liver disease ([place]) 01/01/2019  . Acute pancreatitis 12/13/2018  . Free intraperitoneal air 12/13/2018  . Homicidal ideation   . Gastrointestinal hemorrhage with melena 08/31/2018  . Colon ulcer   . Polyp of ascending colon   . Acute GI bleeding 08/05/2018  . Transaminitis 08/05/2018  . Abdominal pain 08/05/2018  . Nausea and vomiting 08/05/2018  . Substance induced mood disorder ([place]) 07/16/2017  . Severe episode of recurrent major depressive disorder, without psychotic features ([place]) 05/15/2017  . Microcytic anemia 03/18/2017  . Acute blood loss anemia 02/21/2017  . Upper GI bleed 02/08/2017  . Alcohol withdrawal ([place])  02/08/2017  . Cocaine abuse ([place]) 11/18/2016  . GI bleed 11/01/2016  . Alcohol abuse   . Major depressive disorder, recurrent severe without psychotic features ([place]) 07/06/2016  . Alcohol-induced mood disorder ([place]) 06/15/2016  . Seizure ([place]) 05/26/2016  . Tobacco abuse 05/26/2016  . Homeless 05/26/2016  . Essential hypertension 05/26/2016  . Alcohol dependence with alcohol-induced mood disorder ([place]) 02/14/2016  . Severe alcohol withdrawal without perceptual disturbances without complication ([place]) 43/15/4008  . Alcoholic hepatitis without ascites 02/14/2016  . Thrombocytopenia ([place]) 02/14/2016    Past Surgical History:  Procedure Laterality Date  . BIOPSY  08/05/2018   Procedure: BIOPSY;  Surgeon: Rush Landmark Telford Nab., MD;  Location: [facility];  Service: Gastroenterology;;  . BIOPSY  08/07/2018   Procedure: BIOPSY;  Surgeon: Thornton Park, MD;  Location: [facility] ENDOSCOPY;  Service: Gastroenterology;;  . BIOPSY  01/02/2019   Procedure: BIOPSY;  Surgeon: Gatha Mayer, MD;  Location: [facility] ENDOSCOPY;  Service: Endoscopy;;  . COLONOSCOPY WITH PROPOFOL N/A 08/07/2018   Procedure: COLONOSCOPY WITH PROPOFOL;  Surgeon: Thornton Park, MD;  Location: [facility];  Service: Gastroenterology;  Laterality: N/A;  . ESOPHAGOGASTRODUODENOSCOPY N/A 01/02/2019   Procedure: ESOPHAGOGASTRODUODENOSCOPY (EGD);  Surgeon: Gatha Mayer, MD;  Location: [facility]OS[facility];  Service: Endoscopy;  Laterality: N/A;  . ESOPHAGOGASTRODUODENOSCOPY N/A 07/03/2019   Procedure: ESOPHAGOGASTRODUODENOSCOPY (EGD);  Surgeon: Wonda Horner, MD;  Location: [facility]OSCOPY;  Service: Endoscopy;  Laterality: N/A;  . ESOPHAGOGASTRODUODENOSCOPY (  EGD) WITH PROPOFOL N/A 11/02/2016   Procedure: ESOPHAGOGASTRODUODENOSCOPY (EGD) WITH PROPOFOL;  Surgeon: Carol Ada, MD;  Location: [facility] ENDOSCOPY;  Service: Endoscopy;  Laterality: N/A;  . ESOPHAGOGASTRODUODENOSCOPY (EGD) WITH PROPOFOL N/A 08/05/2018   Procedure:  ESOPHAGOGASTRODUODENOSCOPY (EGD) WITH PROPOFOL;  Surgeon: Rush Landmark Telford Nab., MD;  Location: [facility];  Service: Gastroenterology;  Laterality: N/A;  . ESOPHAGOGASTRODUODENOSCOPY (EGD) WITH PROPOFOL N/A 07/14/2019   Procedure: ESOPHAGOGASTRODUODENOSCOPY (EGD) WITH PROPOFOL;  Surgeon: Otis Brace, MD;  Location: [facility] ENDOSCOPY;  Service: Gastroenterology;  Laterality: N/A;  . HERNIA REPAIR    . LEG SURGERY    . POLYPECTOMY  08/07/2018   Procedure: POLYPECTOMY;  Surgeon: Thornton Park, MD;  Location: [facility] ENDOSCOPY;  Service: Gastroenterology;;       Family History  Problem Relation Age of Onset  . Diabetes Mother   . Alcoholism Mother   . Emphysema Father   . Lung cancer Father   . Alcoholism Father     Social History   Tobacco Use  . Smoking status: Current Every Day Smoker    Packs/day: 1.00    Types: Cigarettes  . Smokeless tobacco: Never Used  Vaping Use  . Vaping Use: Never used  Substance Use Topics  . Alcohol use: Yes    Comment: 5+ BEERS DAILY, Patient does not know how much he drank tonight  . Drug use: Not Currently    Frequency: 3.0 times per week    Types: Cocaine    Home Medications Prior to Admission medications   Medication Sig Start Date End Date Taking? Authorizing Provider  ferrous sulfate 325 (65 FE) MG tablet Take 1 tablet (325 mg total) by mouth daily with breakfast. Patient not taking: Reported on 09/23/2018 08/09/18 10/09/18  Elodia Florence., MD    Allergies    Tomato, Aspirin, and Sulfa antibiotics  Review of Systems   Review of Systems  Gastrointestinal:       Black stool  Neurological: Positive for weakness.    Physical Exam Updated Vital Signs BP 131/87   Pulse 77   Temp 97.9 F (36.6 C) (Oral)   Resp 16   Ht 5\' 7"  (1.702 m)   Wt 68 kg   SpO2 95%   BMI 23.48 kg/m   Physical Exam Vitals and nursing note reviewed. Exam conducted with a chaperone present.  Constitutional:      Appearance: Normal appearance.   HENT:     Head: Normocephalic and atraumatic.     Right Ear: External ear normal.     Left Ear: External ear normal.     Nose: Nose normal.     Mouth/Throat:     Mouth: Mucous membranes are moist.     Pharynx: Oropharynx is clear.  Eyes:     Extraocular Movements: Extraocular movements intact.     Conjunctiva/sclera: Conjunctivae normal.     Pupils: Pupils are equal, round, and reactive to light.  Cardiovascular:     Rate and Rhythm: Normal rate and regular rhythm.     Pulses: Normal pulses.     Heart sounds: Normal heart sounds.  Pulmonary:     Effort: Pulmonary effort is normal.     Breath sounds: Normal breath sounds.  Abdominal:     General: Abdomen is flat. Bowel sounds are normal.     Palpations: Abdomen is soft.  Genitourinary:    Rectum: Guaiac result positive.     Comments: Dark stool Musculoskeletal:        General: Normal range of motion.  Cervical back: Normal range of motion and neck supple.  Skin:    General: Skin is warm.     Capillary Refill: Capillary refill takes less than 2 seconds.  Neurological:     General: No focal deficit present.     Mental Status: He is alert and oriented to person, place, and time.  Psychiatric:        Mood and Affect: Mood normal.        Behavior: Behavior normal.     ED Results / Procedures / Treatments   Labs (all labs ordered are listed, but only abnormal results are displayed) Labs Reviewed  CBC WITH DIFFERENTIAL/PLATELET - Abnormal; Notable for the following components:      Result Value   WBC 3.8 (*)    RBC 4.04 (*)    Hemoglobin 8.8 (*)    HCT 29.0 (*)    MCV 71.8 (*)    MCH 21.8 (*)    RDW 19.9 (*)    Platelets 88 (*)    Neutro Abs 1.2 (*)    All other components within normal limits  COMPREHENSIVE METABOLIC PANEL - Abnormal; Notable for the following components:   Glucose, Bld 100 (*)    Calcium 8.7 (*)    Total Protein 8.2 (*)    AST 139 (*)    ALT 60 (*)    All other components within normal  limits  CBG MONITORING, ED - Abnormal; Notable for the following components:   Glucose-Capillary 124 (*)    All other components within normal limits  POC OCCULT BLOOD, ED - Abnormal; Notable for the following components:   Fecal Occult Bld POSITIVE (*)    All other components within normal limits  RESP PANEL BY RT-PCR (FLU A&B, COVID) ARPGX2  MAGNESIUM  PROTIME-INR  ETHANOL  RAPID URINE DRUG SCREEN, HOSP PERFORMED  URINALYSIS, ROUTINE W REFLEX MICROSCOPIC  TYPE AND SCREEN    EKG EKG Interpretation  Date/Time:  Tuesday June 24 2020 22:42:39 EDT Ventricular Rate:  74 PR Interval:  166 QRS Duration: 101 QT Interval:  387 QTC Calculation: 430 R Axis:   84 Text Interpretation: Sinus rhythm Confirmed by Isla Pence [phone]) on 06/24/2020 11:16:10 PM   Radiology No results found.  Procedures Procedures   Medications Ordered in ED Medications  sodium chloride 0.9 % bolus 1,000 mL (1,000 mLs Intravenous New Bag/Given 06/24/20 2343)    And  0.9 %  sodium chloride infusion (has no administration in time range)  pantoprazole (PROTONIX) 80 mg in sodium chloride 0.9 % 100 mL IVPB (has no administration in time range)  pantoprazole (PROTONIX) 80 mg in sodium chloride 0.9 % 100 mL (0.8 mg/mL) infusion (has no administration in time range)  LORazepam (ATIVAN) injection 0-4 mg (has no administration in time range)    Or  LORazepam (ATIVAN) tablet 0-4 mg (has no administration in time range)  LORazepam (ATIVAN) injection 0-4 mg (has no administration in time range)    Or  LORazepam (ATIVAN) tablet 0-4 mg (has no administration in time range)  thiamine tablet 100 mg (has no administration in time range)    Or  thiamine (B-1) injection 100 mg (has no administration in time range)  LORazepam (ATIVAN) injection 1 mg (1 mg Intravenous Given 06/24/20 2343)    ED Course  I have reviewed the triage vital signs and the nursing notes.  Pertinent labs & imaging results that were available  during my care of the patient were reviewed by me and considered in  my medical decision making (see chart for details).    MDM Rules/Calculators/A&P                          Stool is guaiac +.  Protonix bolus and drip started.  Pt signed out to Dr. Dina Rich at shift change.  Final Clinical Impression(s) / ED Diagnoses Final diagnoses:  UGI bleed  Alcohol abuse    Rx / DC Orders ED Discharge Orders    None       Isla Pence, MD 06/25/20 0003

## 2020-06-24 NOTE — ED Triage Notes (Signed)
Patient BIB EMS, patient is homeless, states he had two seizures earlier today, woke up with some confusion on two occasions. Seizures were not witnessed. Patient states he has not had as much alcohol today as he normally drinks but does have a past history of seizures. Patient arrives alert and oriented in no apparent distress, does not appear to be post-ictal. According to EMS, when patient was placed on stretcher and covered with a blanket he stated "that's all I really wanted."  Patient was uncooperative with EMS, refused vital signs and blood sugar check. Patient ambulatory without assistance upon arrival to ED.

## 2020-06-25 ENCOUNTER — Encounter (HOSPITAL_COMMUNITY): Payer: Self-pay | Admitting: Internal Medicine

## 2020-06-25 ENCOUNTER — Inpatient Hospital Stay (HOSPITAL_COMMUNITY): Payer: Self-pay

## 2020-06-25 DIAGNOSIS — K922 Gastrointestinal hemorrhage, unspecified: Secondary | ICD-10-CM

## 2020-06-25 DIAGNOSIS — D500 Iron deficiency anemia secondary to blood loss (chronic): Secondary | ICD-10-CM

## 2020-06-25 DIAGNOSIS — K2971 Gastritis, unspecified, with bleeding: Secondary | ICD-10-CM

## 2020-06-25 DIAGNOSIS — D696 Thrombocytopenia, unspecified: Secondary | ICD-10-CM

## 2020-06-25 LAB — URINALYSIS, ROUTINE W REFLEX MICROSCOPIC
Bilirubin Urine: NEGATIVE
Glucose, UA: NEGATIVE mg/dL
Hgb urine dipstick: NEGATIVE
Ketones, ur: NEGATIVE mg/dL
Leukocytes,Ua: NEGATIVE
Nitrite: NEGATIVE
Protein, ur: NEGATIVE mg/dL
Specific Gravity, Urine: 1.003 — ABNORMAL LOW (ref 1.005–1.030)
pH: 6 (ref 5.0–8.0)

## 2020-06-25 LAB — COMPREHENSIVE METABOLIC PANEL
ALT: 60 U/L — ABNORMAL HIGH (ref 0–44)
AST: 139 U/L — ABNORMAL HIGH (ref 15–41)
Albumin: 4.3 g/dL (ref 3.5–5.0)
Alkaline Phosphatase: 121 U/L (ref 38–126)
Anion gap: 12 (ref 5–15)
BUN: 6 mg/dL (ref 6–20)
CO2: 24 mmol/L (ref 22–32)
Calcium: 8.7 mg/dL — ABNORMAL LOW (ref 8.9–10.3)
Chloride: 105 mmol/L (ref 98–111)
Creatinine, Ser: 0.69 mg/dL (ref 0.61–1.24)
GFR, Estimated: >60 mL/min (ref >60)
Glucose, Bld: 100 mg/dL — ABNORMAL HIGH (ref 70–99)
Potassium: 3.8 mmol/L (ref 3.5–5.1)
Sodium: 141 mmol/L (ref 135–145)
Total Bilirubin: 0.6 mg/dL (ref 0.3–1.2)
Total Protein: 8.2 g/dL — ABNORMAL HIGH (ref 6.5–8.1)

## 2020-06-25 LAB — CBC
HCT: 27.3 % — ABNORMAL LOW (ref 39.0–52.0)
HCT: 27.2 % — ABNORMAL LOW (ref 39.0–52.0)
HCT: 26.8 % — ABNORMAL LOW (ref 39.0–52.0)
Hemoglobin: 8.3 g/dL — ABNORMAL LOW (ref 13.0–17.0)
Hemoglobin: 8.3 g/dL — ABNORMAL LOW (ref 13.0–17.0)
Hemoglobin: 8.1 g/dL — ABNORMAL LOW (ref 13.0–17.0)
MCH: 21.8 pg — ABNORMAL LOW (ref 26.0–34.0)
MCH: 22 pg — ABNORMAL LOW (ref 26.0–34.0)
MCH: 22 pg — ABNORMAL LOW (ref 26.0–34.0)
MCHC: 30.4 g/dL (ref 30.0–36.0)
MCHC: 30.5 g/dL (ref 30.0–36.0)
MCHC: 30.2 g/dL (ref 30.0–36.0)
MCV: 71.8 fL — ABNORMAL LOW (ref 80.0–100.0)
MCV: 72.1 fL — ABNORMAL LOW (ref 80.0–100.0)
MCV: 72.6 fL — ABNORMAL LOW (ref 80.0–100.0)
Platelets: 78 10*3/uL — ABNORMAL LOW (ref 150–400)
Platelets: 79 10*3/uL — ABNORMAL LOW (ref 150–400)
Platelets: 75 10*3/uL — ABNORMAL LOW (ref 150–400)
RBC: 3.8 MIL/uL — ABNORMAL LOW (ref 4.22–5.81)
RBC: 3.77 MIL/uL — ABNORMAL LOW (ref 4.22–5.81)
RBC: 3.69 MIL/uL — ABNORMAL LOW (ref 4.22–5.81)
RDW: 19.7 % — ABNORMAL HIGH (ref 11.5–15.5)
RDW: 19.8 % — ABNORMAL HIGH (ref 11.5–15.5)
RDW: 19.7 % — ABNORMAL HIGH (ref 11.5–15.5)
WBC: 3.2 10*3/uL — ABNORMAL LOW (ref 4.0–10.5)
WBC: 3.3 10*3/uL — ABNORMAL LOW (ref 4.0–10.5)
WBC: 3.4 10*3/uL — ABNORMAL LOW (ref 4.0–10.5)
nRBC: 0 % (ref 0.0–0.2)
nRBC: 0 % (ref 0.0–0.2)
nRBC: 0 % (ref 0.0–0.2)

## 2020-06-25 LAB — RAPID URINE DRUG SCREEN, HOSP PERFORMED
Amphetamines: NOT DETECTED
Barbiturates: NOT DETECTED
Benzodiazepines: NOT DETECTED
Cocaine: NOT DETECTED
Opiates: NOT DETECTED
Tetrahydrocannabinol: NOT DETECTED

## 2020-06-25 LAB — RESP PANEL BY RT-PCR (FLU A&B, COVID) ARPGX2
Influenza A by PCR: NEGATIVE
Influenza B by PCR: NEGATIVE
SARS Coronavirus 2 by RT PCR: NEGATIVE

## 2020-06-25 LAB — TYPE AND SCREEN
ABO/RH(D): O POS
Antibody Screen: NEGATIVE

## 2020-06-25 LAB — ETHANOL: Alcohol, Ethyl (B): 375 mg/dL (ref <10)

## 2020-06-25 LAB — MAGNESIUM: Magnesium: 2.2 mg/dL (ref 1.7–2.4)

## 2020-06-25 LAB — HIV ANTIBODY (ROUTINE TESTING W REFLEX): HIV Screen 4th Generation wRfx: NONREACTIVE

## 2020-06-25 MED ORDER — LORAZEPAM 1 MG PO TABS: 0.0000 mg | ORAL_TABLET | Freq: Four times a day (QID) | ORAL | Status: DC

## 2020-06-25 MED ORDER — NICOTINE 21 MG/24HR TD PT24
21.0000 mg | MEDICATED_PATCH | Freq: Every day | TRANSDERMAL | Status: DC
  Filled 2020-06-25 (×2): qty 1

## 2020-06-25 MED ORDER — SODIUM CHLORIDE 0.9 % IV SOLN
50.0000 ug/h | INTRAVENOUS | Status: DC
  Administered 2020-06-25 – 2020-06-26 (×3): 50 ug/h via INTRAVENOUS
  Filled 2020-06-25 (×4): qty 1

## 2020-06-25 MED ORDER — LORAZEPAM 2 MG/ML IJ SOLN: 0.0000 mg | Freq: Four times a day (QID) | INTRAMUSCULAR | Status: DC

## 2020-06-25 MED ORDER — ADULT MULTIVITAMIN W/MINERALS CH
1.0000 | ORAL_TABLET | Freq: Every day | ORAL | Status: DC
  Administered 2020-06-26 – 2020-06-27 (×2): 1 via ORAL
  Filled 2020-06-25 (×3): qty 1

## 2020-06-25 MED ORDER — SODIUM CHLORIDE 0.9 % IV SOLN: INTRAVENOUS | Status: DC

## 2020-06-25 MED ORDER — THIAMINE HCL 100 MG PO TABS
100.0000 mg | ORAL_TABLET | Freq: Every day | ORAL | Status: DC
  Administered 2020-06-26 – 2020-06-27 (×2): 100 mg via ORAL
  Filled 2020-06-25 (×3): qty 1

## 2020-06-25 MED ORDER — SODIUM CHLORIDE 0.9% IV SOLUTION: Freq: Once | INTRAVENOUS | Status: DC

## 2020-06-25 MED ORDER — SODIUM CHLORIDE 0.9 % IV SOLN: INTRAVENOUS | Status: AC

## 2020-06-25 MED ORDER — LORAZEPAM 2 MG/ML IJ SOLN
1.0000 mg | INTRAMUSCULAR | Status: DC | PRN
  Administered 2020-06-25 (×2): 1 mg via INTRAVENOUS
  Filled 2020-06-25 (×2): qty 1

## 2020-06-25 MED ORDER — LORAZEPAM 1 MG PO TABS: 0.0000 mg | ORAL_TABLET | Freq: Two times a day (BID) | ORAL | Status: DC

## 2020-06-25 MED ORDER — LORAZEPAM 2 MG/ML IJ SOLN: 0.0000 mg | Freq: Two times a day (BID) | INTRAMUSCULAR | Status: DC

## 2020-06-25 MED ORDER — THIAMINE HCL 100 MG PO TABS: 100.0000 mg | ORAL_TABLET | Freq: Every day | ORAL | Status: DC

## 2020-06-25 MED ORDER — THIAMINE HCL 100 MG/ML IJ SOLN: 100.0000 mg | Freq: Every day | INTRAMUSCULAR | Status: DC

## 2020-06-25 MED ORDER — FOLIC ACID 1 MG PO TABS
1.0000 mg | ORAL_TABLET | Freq: Every day | ORAL | Status: DC
  Administered 2020-06-26 – 2020-06-27 (×2): 1 mg via ORAL
  Filled 2020-06-25 (×3): qty 1

## 2020-06-25 MED ORDER — OCTREOTIDE LOAD VIA INFUSION
50.0000 ug | Freq: Once | INTRAVENOUS | Status: AC
  Administered 2020-06-25: 50 ug via INTRAVENOUS
  Filled 2020-06-25: qty 25

## 2020-06-25 MED ORDER — THIAMINE HCL 100 MG/ML IJ SOLN
100.0000 mg | Freq: Every day | INTRAMUSCULAR | Status: DC
  Filled 2020-06-25 (×2): qty 2

## 2020-06-25 MED ORDER — IOHEXOL 300 MG/ML  SOLN
100.0000 mL | Freq: Once | INTRAMUSCULAR | Status: AC | PRN
  Administered 2020-06-25: 100 mL via INTRAVENOUS

## 2020-06-25 MED ORDER — LORAZEPAM 1 MG PO TABS
1.0000 mg | ORAL_TABLET | ORAL | Status: DC | PRN
  Administered 2020-06-25 – 2020-06-26 (×3): 1 mg via ORAL
  Filled 2020-06-25 (×3): qty 1

## 2020-06-25 NOTE — H&P (View-Only) (Signed)
Referring Provider: Dr. Shela Leff Primary Care Physician:  Barry Moore, No Pcp Per (Inactive) Primary Gastroenterologist:  Althia Forts  Reason for Consultation:  Anemia and melena  HPI: Barry Moore is a 55 y.o. male with past medical history of alcohol use, alcoholic hepatitis, probably cirrhosis, history of upper GI bleeding, history of alcoholic pancreatitis, seizures, and thrombocytopenia presenting for consultation of anemia and melena.  Barry Moore reports black stools for the past 2 days.  Stools are loose but not liquid.  He denies hematochezia.  He had one episode of hematemesis several days ago.  Denies any current nausea/vomiting.  Reports intermittent abdominal pain.  Denies ASA/NSAID/blood thinner use. Drinks 6-8 40oz beers per day.    He reports he is currently homeless and [place] a [place] the [place].  Denies known family history of liver disease.  Last EGD 07/15/2019: Grade I small (<1mm) nonbleeding esophageal varices, gastritis, and chronic duodenitis.  No evidence of active bleeding.  Colonoscopy 07/2018: Two 2 to 3 mm polyps in the ascending colon and in the cecum, (bx: tubular adenoma, sessile serrated adenoma), a single (solitary) ulcer in the proximal ascending colon (ischemia-type changes, clinically ulcer)  Past Medical History:  Diagnosis Date  . Alcoholic hepatitis   . Depression   . ETOH abuse   . Gastric bleed 08/2018  . Hypertension   . Pancreatitis   . Seizures ([place])   . Suicidal behavior     Past Surgical History:  Procedure Laterality Date  . BIOPSY  08/05/2018   Procedure: BIOPSY;  Surgeon: Rush Landmark Telford Nab., MD;  Location: [facility];  Service: Gastroenterology;;  . BIOPSY  08/07/2018   Procedure: BIOPSY;  Surgeon: Thornton Park, MD;  Location: [facility] ENDOSCOPY;  Service: Gastroenterology;;  . BIOPSY  01/02/2019   Procedure: BIOPSY;  Surgeon: Gatha Mayer, MD;  Location: [facility] ENDOSCOPY;  Service: Endoscopy;;  . COLONOSCOPY WITH PROPOFOL  N/A 08/07/2018   Procedure: COLONOSCOPY WITH PROPOFOL;  Surgeon: Thornton Park, MD;  Location: [facility];  Service: Gastroenterology;  Laterality: N/A;  . ESOPHAGOGASTRODUODENOSCOPY N/A 01/02/2019   Procedure: ESOPHAGOGASTRODUODENOSCOPY (EGD);  Surgeon: Gatha Mayer, MD;  Location: [facility];  Service: Endoscopy;  Laterality: N/A;  . ESOPHAGOGASTRODUODENOSCOPY N/A 07/03/2019   Procedure: ESOPHAGOGASTRODUODENOSCOPY (EGD);  Surgeon: Wonda Horner, MD;  Location: [facility];  Service: Endoscopy;  Laterality: N/A;  . ESOPHAGOGASTRODUODENOSCOPY (EGD) WITH PROPOFOL N/A 11/02/2016   Procedure: ESOPHAGOGASTRODUODENOSCOPY (EGD) WITH PROPOFOL;  Surgeon: Carol Ada, MD;  Location: [facility] ENDOSCOPY;  Service: Endoscopy;  Laterality: N/A;  . ESOPHAGOGASTRODUODENOSCOPY (EGD) WITH PROPOFOL N/A 08/05/2018   Procedure: ESOPHAGOGASTRODUODENOSCOPY (EGD) WITH PROPOFOL;  Surgeon: Rush Landmark Telford Nab., MD;  Location: [facility];  Service: Gastroenterology;  Laterality: N/A;  . ESOPHAGOGASTRODUODENOSCOPY (EGD) WITH PROPOFOL N/A 07/14/2019   Procedure: ESOPHAGOGASTRODUODENOSCOPY (EGD) WITH PROPOFOL;  Surgeon: Otis Brace, MD;  Location: [facility] ENDOSCOPY;  Service: Gastroenterology;  Laterality: N/A;  . HERNIA REPAIR    . LEG SURGERY    . POLYPECTOMY  08/07/2018   Procedure: POLYPECTOMY;  Surgeon: Thornton Park, MD;  Location: [facility] ENDOSCOPY;  Service: Gastroenterology;;    Prior to Admission medications   Medication Sig Start Date End Date Taking? Authorizing Provider  ferrous sulfate 325 (65 FE) MG tablet Take 1 tablet (325 mg total) by mouth daily with breakfast. Barry Moore not taking: Reported on 09/23/2018 08/09/18 10/09/18  Elodia Florence., MD    Scheduled Meds: . folic acid  1 mg Oral Daily  . multivitamin with minerals  1 tablet Oral Daily  . nicotine  21 mg Transdermal Daily  . thiamine  100 mg Oral Daily   Or  . thiamine  100 mg Intravenous Daily   Continuous Infusions: . sodium  chloride 125 mL/hr at 06/25/20 0720  . octreotide  (SANDOSTATIN)    IV infusion 50 mcg/hr (06/25/20 0423)  . pantoprozole (PROTONIX) infusion 8 mg/hr (06/25/20 0126)   PRN Meds:.LORazepam **OR** LORazepam  Allergies as of 06/24/2020 - Review Complete 06/24/2020  Allergen Reaction Noted  . Tomato Shortness Of Breath and Nausea And Vomiting 02/28/2017  . Aspirin Nausea And Vomiting 11/21/2015  . Sulfa antibiotics Other (See Comments) 06/21/2019    Family History  Problem Relation Age of Onset  . Diabetes Mother   . Alcoholism Mother   . Emphysema Father   . Lung cancer Father   . Alcoholism Father     Social History   Socioeconomic History  . Marital status: Divorced    Spouse name: Not on file  . Number of children: Not on file  . Years of education: Not on file  . Highest education level: Not on file  Occupational History  . Not on file  Tobacco Use  . Smoking status: Current Every Day Smoker    Packs/day: 1.00    Types: Cigarettes  . Smokeless tobacco: Never Used  Vaping Use  . Vaping Use: Never used  Substance and Sexual Activity  . Alcohol use: Yes    Comment: 5+ BEERS DAILY, Barry Moore does not know how much he drank tonight  . Drug use: Not Currently    Frequency: 3.0 times per week    Types: Cocaine  . Sexual activity: Not Currently  Other Topics Concern  . Not on file  Social History Narrative  . Not on file   Social Determinants of Health   Financial Resource Strain: Not on file  Food Insecurity: Not on file  Transportation Needs: Not on file  Physical Activity: Not on file  Stress: Not on file  Social Connections: Not on file  Intimate Partner Violence: Not on file    Review of Systems: Review of Systems  Constitutional: Positive for malaise/fatigue. Negative for chills and fever.  HENT: Negative for hearing loss and tinnitus.   Eyes: Negative for pain and redness.  Respiratory: Negative for cough and shortness of breath.   Cardiovascular:  Negative for chest pain and palpitations.  Gastrointestinal: Positive for abdominal pain, diarrhea and melena. Negative for blood in stool, constipation, heartburn, nausea and vomiting.  Genitourinary: Negative for flank pain and hematuria.  Musculoskeletal: Negative for falls and joint pain.  Skin: Negative for itching and rash.  Neurological: Negative for seizures and loss of consciousness.  Endo/Heme/Allergies: Negative for polydipsia. Does not bruise/bleed easily.  Psychiatric/Behavioral: Negative for substance abuse. The Barry Moore is not nervous/anxious.      Physical Exam: Vital signs: Vitals:   06/25/20 0600 06/25/20 0859  BP: 138/78 138/78  Pulse: 82 82  Resp: 16   Temp:    SpO2: 95%       Physical Exam Vitals reviewed.  Constitutional:      General: He is not in acute distress. HENT:     Head: Normocephalic and atraumatic.     Nose: Nose normal. No congestion.  Eyes:     General: No scleral icterus.    Extraocular Movements: Extraocular movements intact.  Cardiovascular:     Rate and Rhythm: Normal rate and regular rhythm.     Pulses: Normal pulses.  Pulmonary:     Effort: Pulmonary effort  is normal. No respiratory distress.  Abdominal:     General: Bowel sounds are normal. There is distension (mild).     Palpations: Abdomen is soft. There is no mass.     Tenderness: There is abdominal tenderness (mild, diffuse). There is no guarding or rebound.     Hernia: No hernia is present.  Musculoskeletal:        General: No swelling or tenderness.     Cervical back: Normal range of motion and neck supple.  Skin:    General: Skin is warm and dry.     Coloration: Skin is not jaundiced.  Neurological:     General: No focal deficit present.     Mental Status: He is oriented to person, place, and time. He is lethargic.  Psychiatric:        Mood and Affect: Mood normal.        Behavior: Behavior normal. Behavior is cooperative.     GI:  Lab Results: Recent Labs     06/24/20 2340 06/25/20 0405 06/25/20 0721  WBC 3.8* 3.2* 3.3*  HGB 8.8* 8.3* 8.3*  HCT 29.0* 27.3* 27.2*  PLT 88* 78* 79*   BMET Recent Labs    06/24/20 2340  NA 141  K 3.8  CL 105  CO2 24  GLUCOSE 100*  BUN 6  CREATININE 0.69  CALCIUM 8.7*   LFT Recent Labs    06/24/20 2340  PROT 8.2*  ALBUMIN 4.3  AST 139*  ALT 60*  ALKPHOS 121  BILITOT 0.6   PT/INR Recent Labs    06/24/20 2340  LABPROT 12.8  INR 1.0     Studies/Results: No results found.  Impression: Suspected upper GI bleeding: Hgb 8.3, stable -History of small grade I esophageal varices -Normal BUN 6/ Cr 0.69  Suspected underlying cirrhosis:  -Platelets 79 K/uL -AST 139/ ALT 60  Seizures  Alcohol use disorder  Plan: EGD tomorrow.  CT abdomen/pelvis for ongoing abdominal pain.  Continue IV octreotide and Protonix.  Transfuse 1u platelets, given thrombocytopenia.  Clear liquid diet OK from a GI standpoint.  NPO after midnight.  Continue to monitor H&H with transfusion as needed to maintain Hgb >7.  Recommend social work consult.  [facility] will follow.   LOS: 0 days   Salley Slaughter  PA-C 06/25/2020, 10:09 AM  Contact #  [phone]

## 2020-06-25 NOTE — Progress Notes (Signed)
[facility] PROGRESS NOTE    Progress Note  Barry Moore  PYP:[id number] DOB: [birth year] DOA: 06/24/2020 PCP: Patient, No Pcp Per (Inactive)     Brief Narrative:   Barry Moore is an 55 y.o. male past medical history significant for alcohol abuse alcoholic hepatitis, cirrhosis history of GI bleed homelessness pancreatitis depression ongoing tobacco abuse chronic thrombocytopenia comes into the ED for 2 episodes of unwitnessed seizure, EMS arrived not postictal he is also complaining of melena white count of 3.8, hemoglobin 8.8 ( hemoglobin 12/12/2019 was 10.2) platelet count 88 he relates to back-to-back seizures, and started having melanotic stools yesterday.  He could has an ongoing use of alcohol.  Denies any abdominal pain or chest pain.  Orthostatics are positive in the ED.   ignificant studies: none  Antibiotics: None  Microbiology data: Blood culture:  Procedures: None  Assessment/Plan:   Melanotic stools: No active emesis in the ED.  Guaiac positive, hemoglobin of 8.8 last year in September was 10. Previous EGD about 1 year ago showed grade 1 esophageal varices and chronic duodenitis. Keep n.p.o., continue IV PPI started on octreotide. Start on IV fluid resuscitation and type and screen. [facility] has been consulted.  Alcohol abuse with intoxication: Patient is inebriated during this hospitalization with an elevated alcohol level of 375. Started on CIWA protocol Ativan as needed continue thiamine and folate.  Questionable seizures: Was not postictal when EMS arrived alcohol withdrawal less likely. UDS is negative, mag is stable.  Homelessness: Environmental education officer.  Acute on chronic thrombocytopenia: Likely due to heavy alcohol abuse continue to monitor platelet counts if below 50 will transfuse in the setting of GI bleed.  Essential hypertension: Continue to monitor hold all antihypertensive medication.  Tobacco abuse: Continue cutting patch. DVT  prophylaxis: scd Family Communication:none Status is: Observation  The patient will require care spanning > 2 midnights and should be moved to inpatient because: Hemodynamically unstable  Dispo: The patient is from: Home              Anticipated d/c is to: Home              Patient currently is not medically stable to d/c.   Difficult to place patient No        Code Status:     Code Status Orders  (From admission, onward)         Start     Ordered   06/25/20 0350  Full code  Continuous        06/25/20 0353        Code Status History    Date Active Date Inactive Code Status Order ID Comments User Context   06/25/2020 0002 06/25/2020 0353 Full Code [id number]  Merryl Hacker, MD ED   06/17/2020 1900 06/18/2020 2150 Full Code [id number]  Carlisle Cater, PA-C ED   06/10/2020 0459 06/10/2020 1547 Full Code [id number]  Fatima Blank, MD ED   05/30/2020 2153 05/31/2020 1932 Full Code [id number]  Carmin Muskrat, MD ED   12/28/2019 0520 12/28/2019 1701 Full Code [id number]  Malachy Mood, PA ED   12/27/2019 2305 12/28/2019 0447 Full Code [id number]  Blanchie Dessert, MD ED   12/12/2019 2210 12/13/2019 1741 Full Code [id number]  Rozetta Nunnery, NP ED   07/21/2019 0528 07/21/2019 1312 Full Code [id number]  Delora Fuel, MD ED   07/19/2019 0755 07/20/2019 2047 Full Code [id number]  Vernelle Emerald, MD ED   07/14/2019 0331 07/15/2019 1831 Full Code [id number]  Vianne Bulls, MD Inpatient   07/06/2019 0434 07/06/2019 1655 Full Code [id number]  Delora Fuel, MD ED   07/02/2019 1356 07/04/2019 1440 Full Code [id number]  Vianne Bulls, MD ED   04/29/2019 0256 04/29/2019 1651 Full Code [id number]  Palumbo, April, MD ED   04/29/2019 0209 04/29/2019 0256 Full Code [id number]  Sherryle Lis, Mia A, PA-C ED   01/01/2019 0119 01/03/2019 1748 Full Code [id number]  Shela Leff, MD ED   12/13/2018 0303 12/14/2018 1910 Full Code [id number]  Rise Patience, MD ED   11/19/2018 2112 11/20/2018 1551 Full Code [id number]   Fransico Meadow, PA-C ED   10/19/2018 0225 10/19/2018 1721 Full Code [id number]  McDonald, Mia A, PA-C ED   09/23/2018 1322 09/24/2018 1551 Full Code [id number]  Robinson, [place] N, PA-C ED   09/15/2018 1354 09/16/2018 1231 Full Code [id number]  Delia Heady, PA-C ED   08/31/2018 0039 09/01/2018 1754 Full Code [id number]  Etta Quill, DO ED   08/20/2018 2307 08/21/2018 0955 Full Code [id number]  Long, Wonda Olds, MD ED   08/18/2018 0001 08/18/2018 1654 Full Code [id number]  [name], MD ED   12/01/2017 2317 12/02/2017 1902 Full Code [id number]  Kinnie Feil, PA-C ED   09/29/2017 1102 09/30/2017 1520 Full Code [id number]  Valarie Merino, MD ED   09/29/2017 0505 09/29/2017 1102 Full Code [id number]  Orpah Greek, MD ED   09/01/2017 1801 09/02/2017 1422 Full Code [id number]  Valarie Merino, MD ED   08/24/2017 0652 08/24/2017 1532 Full Code [id number]  Veryl Speak, MD ED   08/19/2017 0546 08/19/2017 1119 Full Code [id number]  Shanon Rosser, MD ED   08/13/2017 1741 08/14/2017 1643 Full Code [id number]  Jillyn Ledger, PA-C ED   08/06/2017 2137 08/07/2017 1626 Full Code [id number]  Hayden Rasmussen, MD ED   07/16/2017 2158 07/18/2017 1415 Full Code [id number]  Ethelene Hal, NP Inpatient   07/15/2017 2332 07/16/2017 2135 Full Code [id number]  Blanchie Dessert, MD ED   06/24/2017 2147 06/26/2017 1410 Full Code [id number]  Joanne Gavel, PA-C ED   06/17/2017 2149 06/19/2017 1349 Full Code [id number]  Pisciotta, Charna Elizabeth ED   06/07/2017 0628 06/07/2017 1645 Full Code [id number]  Antonietta Breach, PA-C ED   06/03/2017 0218 06/03/2017 0909 Full Code [id number]  Ward, Delice Bison, DO ED   06/01/2017 0020 06/01/2017 0940 Full Code [id number]  Montine Circle, PA-C ED   05/21/2017 2209 05/22/2017 0919 Full Code [id number]  Recardo Evangelist, PA-C ED   05/15/2017 1435 05/19/2017 1452 Full Code [id number]  Rozetta Nunnery, NP Inpatient   05/14/2017 2028 05/15/2017 1301 Full Code [id number]  Merrily Pew, MD ED   05/06/2017 0335 05/06/2017 0912  Full Code [id number]  Ward, Delice Bison, DO ED   04/28/2017 0849 04/28/2017 1549 Full Code [id number]  Patrecia Pour, NP ED   04/27/2017 0755 04/28/2017 0849 Full Code [id number]  Varney Biles, MD ED   04/23/2017 0708 04/23/2017 1126 Full Code [id number]  Delora Fuel, MD ED   03/18/2017 0532 03/19/2017 1910 Full Code [id number]  Etta Quill, DO ED   02/09/2017 0106 02/12/2017 1311 Full Code [id number]  Reubin Milan, MD Inpatient   12/28/2016 1801 12/30/2016 1735 Full Code [id number]  Daleen Bo, MD ED   11/22/2016 2306 11/23/2016 1732 Full Code [id number]  Ward, [name], PA-C ED   11/18/2016 0625 11/18/2016 1425 Full Code [id number]  Orpah Greek, MD ED   11/01/2016 1310 11/03/2016 2130 Full  Code [id number]  Merton Border, MD ED   11/01/2016 1244 11/01/2016 1310 Full Code [id number]  Merton Border, MD ED   10/15/2016 0504 10/15/2016 1902 Full Code [id number]  Street, [place], [place] ED   08/19/2016 1625 08/20/2016 1359 Full Code [id number]  Arlean Hopping ED   08/13/2016 1557 08/14/2016 2031 Full Code [id number]  Blanchie Dessert, MD ED   07/06/2016 1200 07/12/2016 1828 Full Code [id number]  Patrecia Pour, NP Inpatient   07/05/2016 2230 07/06/2016 1132 Full Code [id number]  Drenda Freeze, MD ED   06/15/2016 2347 06/23/2016 1854 Full Code [id number]  Laverle Hobby, PA-C Inpatient   06/14/2016 2207 06/15/2016 2312 Full Code [id number]  Tanna Furry, MD ED   05/26/2016 1955 05/29/2016 1547 Full Code [id number]  Ivor Costa, MD ED   05/26/2016 1804 05/26/2016 1955 Full Code [id number]  Renita Papa, [place] ED   03/05/2016 1826 03/09/2016 2112 Full Code [id number]  Benjamine Mola, [place] Inpatient   03/05/2016 1826 03/05/2016 1826 Full Code [id number]  Benjamine Mola, [place] Inpatient   03/04/2016 1838 03/05/2016 1626 Full Code [id number]  Doristine Devoid, PA-C ED   02/14/2016 2335 02/19/2016 1534 Full Code [id number]  Edwin Dada, MD ED   02/14/2016 1323 02/14/2016 2335 Full Code [id number]  Lacretia Leigh,  MD ED   Advance Care Planning Activity        IV Access:    Peripheral IV   Procedures and diagnostic studies:   No results found.   Medical Consultants:    None.   Subjective:    Barry Moore having abdominal pain  Objective:    Vitals:   06/25/20 0300 06/25/20 0330 06/25/20 0400 06/25/20 0600  BP: 107/62 129/65 (!) 98/58 138/78  Pulse: 79 93 86 82  Resp: 19 19 17 16   Temp:      TempSrc:      SpO2: 96% 93% 96% 95%  Weight:      Height:       SpO2: 95 %  No intake or output data in the 24 hours ending 06/25/20 0658 Filed Weights   06/24/20 2254  Weight: 68 kg    Exam: General exam: In no acute distress. Respiratory system: Good air movement and clear to auscultation. Cardiovascular system: S1 & S2 heard, RRR. No JVD. Gastrointestinal system: Positive bowel sounds soft no epigastric tenderness but some mild tenderness left and right lower quadrant. Central nervous system: Alert and oriented. No focal neurological deficits. Extremities: No pedal edema. Skin: No rashes, lesions or ulcers Psychiatry: Judgement and insight appear normal. Mood & affect appropriate.    Data Reviewed:    Labs: Basic Metabolic Panel: Recent Labs  Lab 06/24/20 2340  NA 141  K 3.8  CL 105  CO2 24  GLUCOSE 100*  BUN 6  CREATININE 0.69  CALCIUM 8.7*  MG 2.2   GFR Estimated Creatinine Clearance: 97.5 mL/min (by C-G formula based on SCr of 0.69 mg/dL). Liver Function Tests: Recent Labs  Lab 06/24/20 2340  AST 139*  ALT 60*  ALKPHOS 121  BILITOT 0.6  PROT 8.2*  ALBUMIN 4.3   No results for input(s): LIPASE, AMYLASE in the last 168 hours. No results for input(s): AMMONIA in the last 168 hours. Coagulation profile Recent Labs  Lab 06/24/20 2340  INR 1.0   COVID-19 Labs  No results for input(s): DDIMER, FERRITIN, LDH, CRP in the last 72 hours.  Lab Results  Component Value Date   [place] NEGATIVE 06/24/2020  [place] NEGATIVE  06/17/2020   [place] NEGATIVE 06/10/2020   [place] NEGATIVE 05/30/2020    CBC: Recent Labs  Lab 06/24/20 2340 06/25/20 0405  WBC 3.8* 3.2*  NEUTROABS 1.2*  --   HGB 8.8* 8.3*  HCT 29.0* 27.3*  MCV 71.8* 71.8*  PLT 88* 78*   Cardiac Enzymes: No results for input(s): CKTOTAL, CKMB, CKMBINDEX, TROPONINI in the last 168 hours. BNP (last 3 results) No results for input(s): PROBNP in the last 8760 hours. CBG: Recent Labs  Lab 06/24/20 2323  GLUCAP 124*   D-Dimer: No results for input(s): DDIMER in the last 72 hours. Hgb A1c: No results for input(s): HGBA1C in the last 72 hours. Lipid Profile: No results for input(s): CHOL, HDL, LDLCALC, TRIG, CHOLHDL, LDLDIRECT in the last 72 hours. Thyroid function studies: No results for input(s): TSH, T4TOTAL, T3FREE, THYROIDAB in the last 72 hours.  Invalid input(s): FREET3 Anemia work up: No results for input(s): VITAMINB12, FOLATE, FERRITIN, TIBC, IRON, RETICCTPCT in the last 72 hours. Sepsis Labs: Recent Labs  Lab 06/24/20 2340 06/25/20 0405  WBC 3.8* 3.2*   Microbiology Recent Results (from the past 240 hour(s))  Resp Panel by RT-PCR (Flu A&B, Covid) Nasopharyngeal Swab     Status: None   Collection Time: 06/17/20  7:30 PM   Specimen: Nasopharyngeal Swab; Nasopharyngeal(NP) swabs in vial transport medium  Result Value Ref Range Status   SARS Coronavirus 2 by RT PCR NEGATIVE NEGATIVE Final    Comment: (NOTE) SARS-CoV-2 target nucleic acids are NOT DETECTED.  The SARS-CoV-2 RNA is generally detectable in upper respiratory specimens during the acute phase of infection. The lowest concentration of SARS-CoV-2 viral copies this assay can detect is 138 copies/mL. A negative result does not preclude SARS-Cov-2 infection and should not be used as the sole basis for treatment or other patient management decisions. A negative result may occur with  improper specimen collection/handling, submission of specimen other than  nasopharyngeal swab, presence of viral mutation(s) within the areas targeted by this assay, and inadequate number of viral copies(<138 copies/mL). A negative result must be combined with clinical observations, patient history, and epidemiological information. The expected result is Negative.  Fact Sheet for Patients:  EntrepreneurPulse.com.au  Fact Sheet for Healthcare Providers:  IncredibleEmployment.be  This test is no t yet approved or cleared by the [place] FDA and  has been authorized for detection and/or diagnosis of SARS-CoV-2 by FDA under an Emergency Use Authorization (EUA). This EUA will remain  in effect (meaning this test can be used) for the duration of the COVID-19 declaration under Section 564(b)(1) of the Act, 21 U.S.C.section 360bbb-3(b)(1), unless the authorization is terminated  or revoked sooner.       Influenza A by PCR NEGATIVE NEGATIVE Final   Influenza B by PCR NEGATIVE NEGATIVE Final    Comment: (NOTE) The Xpert Xpress SARS-CoV-2/FLU/RSV plus assay is intended as an aid in the diagnosis of influenza from Nasopharyngeal swab specimens and should not be used as a sole basis for treatment. Nasal washings and aspirates are unacceptable for Xpert Xpress SARS-CoV-2/FLU/RSV testing.  Fact Sheet for Patients: EntrepreneurPulse.com.au  Fact Sheet for Healthcare Providers: IncredibleEmployment.be  This test is not yet approved or cleared by the [place] FDA and has been authorized for detection and/or diagnosis of SARS-CoV-2 by FDA under an Emergency Use Authorization (EUA). This EUA will remain in effect (meaning this test can be used) for the duration of the COVID-19 declaration under Section 564(b)(1) of the Act, 21 U.S.C. section  360bbb-3(b)(1), unless the authorization is terminated or revoked.  Performed at [facility], [place] [address], [place], [place] [address]   Resp Panel by RT-PCR (Flu A&B, Covid) Nasopharyngeal Swab     Status: None   Collection Time: 06/24/20 11:47 PM   Specimen: Nasopharyngeal Swab; Nasopharyngeal(NP) swabs in vial transport medium  Result Value Ref Range Status   SARS Coronavirus 2 by RT PCR NEGATIVE NEGATIVE Final    Comment: (NOTE) SARS-CoV-2 target nucleic acids are NOT DETECTED.  The SARS-CoV-2 RNA is generally detectable in upper respiratory specimens during the acute phase of infection. The lowest concentration of SARS-CoV-2 viral copies this assay can detect is 138 copies/mL. A negative result does not preclude SARS-Cov-2 infection and should not be used as the sole basis for treatment or other patient management decisions. A negative result may occur with  improper specimen collection/handling, submission of specimen other than nasopharyngeal swab, presence of viral mutation(s) within the areas targeted by this assay, and inadequate number of viral copies(<138 copies/mL). A negative result must be combined with clinical observations, patient history, and epidemiological information. The expected result is Negative.  Fact Sheet for Patients:  EntrepreneurPulse.com.au  Fact Sheet for Healthcare Providers:  IncredibleEmployment.be  This test is no t yet approved or cleared by the [place] FDA and  has been authorized for detection and/or diagnosis of SARS-CoV-2 by FDA under an Emergency Use Authorization (EUA). This EUA will remain  in effect (meaning this test can be used) for the duration of the COVID-19 declaration under Section 564(b)(1) of the Act, 21 U.S.C.section 360bbb-3(b)(1), unless the authorization is terminated  or revoked sooner.       Influenza A by PCR NEGATIVE NEGATIVE Final   Influenza B by PCR NEGATIVE NEGATIVE Final    Comment: (NOTE) The Xpert Xpress SARS-CoV-2/FLU/RSV plus assay is intended as an aid in the  diagnosis of influenza from Nasopharyngeal swab specimens and should not be used as a sole basis for treatment. Nasal washings and aspirates are unacceptable for Xpert Xpress SARS-CoV-2/FLU/RSV testing.  Fact Sheet for Patients: EntrepreneurPulse.com.au  Fact Sheet for Healthcare Providers: IncredibleEmployment.be  This test is not yet approved or cleared by the [place] FDA and has been authorized for detection and/or diagnosis of SARS-CoV-2 by FDA under an Emergency Use Authorization (EUA). This EUA will remain in effect (meaning this test can be used) for the duration of the COVID-19 declaration under Section 564(b)(1) of the Act, 21 U.S.C. section 360bbb-3(b)(1), unless the authorization is terminated or revoked.  Performed at [facility], [place] [address] [place], [place] [address]      Medications:   . folic acid  1 mg Oral Daily  . multivitamin with minerals  1 tablet Oral Daily  . nicotine  21 mg Transdermal Daily  . thiamine  100 mg Oral Daily   Or  . thiamine  100 mg Intravenous Daily   Continuous Infusions: . sodium chloride 125 mL/hr at 06/25/20 0127  . octreotide  (SANDOSTATIN)    IV infusion 50 mcg/hr (06/25/20 0423)  . pantoprozole (PROTONIX) infusion 8 mg/hr (06/25/20 0126)      LOS: 0 days   Charlynne Cousins  [facility]  06/25/2020, 6:58 AM

## 2020-06-25 NOTE — Consult Note (Signed)
Referring Provider: Dr. Shela Leff Primary Care Physician:  Patient, No Pcp Per (Inactive) Primary Gastroenterologist:  Althia Forts  Reason for Consultation:  Anemia and melena  HPI: Barry Moore is a 55 y.o. male with past medical history of alcohol use, alcoholic hepatitis, probably cirrhosis, history of upper GI bleeding, history of alcoholic pancreatitis, seizures, and thrombocytopenia presenting for consultation of anemia and melena.  Patient reports black stools for the past 2 days.  Stools are loose but not liquid.  He denies hematochezia.  He had one episode of hematemesis several days ago.  Denies any current nausea/vomiting.  Reports intermittent abdominal pain.  Denies ASA/NSAID/blood thinner use. Drinks 6-8 40oz beers per day.    He reports he is currently homeless and [place] in the woods.  Denies known family history of liver disease.  Last EGD 07/15/2019: Grade I small (<40mm) nonbleeding esophageal varices, gastritis, and chronic duodenitis.  No evidence of active bleeding.  Colonoscopy 07/2018: Two 2 to 3 mm polyps in the ascending colon and in the cecum, (bx: tubular adenoma, sessile serrated adenoma), a single (solitary) ulcer in the proximal ascending colon (ischemia-type changes, clinically ulcer)  Past Medical History:  Diagnosis Date  . Alcoholic hepatitis   . Depression   . ETOH abuse   . Gastric bleed 08/2018  . Hypertension   . Pancreatitis   . Seizures ([place])   . Suicidal behavior     Past Surgical History:  Procedure Laterality Date  . BIOPSY  08/05/2018   Procedure: BIOPSY;  Surgeon: Rush Landmark Telford Nab., MD;  Location: [place];  Service: Gastroenterology;;  . BIOPSY  08/07/2018   Procedure: BIOPSY;  Surgeon: Thornton Park, MD;  Location: [facility] ENDOSCOPY;  Service: Gastroenterology;;  . BIOPSY  01/02/2019   Procedure: BIOPSY;  Surgeon: Gatha Mayer, MD;  Location: [facility] ENDOSCOPY;  Service: Endoscopy;;  . COLONOSCOPY WITH PROPOFOL  N/A 08/07/2018   Procedure: COLONOSCOPY WITH PROPOFOL;  Surgeon: Thornton Park, MD;  Location: [place];  Service: Gastroenterology;  Laterality: N/A;  . ESOPHAGOGASTRODUODENOSCOPY N/A 01/02/2019   Procedure: ESOPHAGOGASTRODUODENOSCOPY (EGD);  Surgeon: Gatha Mayer, MD;  Location: [facility] ENDOSCOPY;  Service: Endoscopy;  Laterality: N/A;  . ESOPHAGOGASTRODUODENOSCOPY N/A 07/03/2019   Procedure: ESOPHAGOGASTRODUODENOSCOPY (EGD);  Surgeon: Wonda Horner, MD;  Location: [facility] ENDOSCOPY;  Service: Endoscopy;  Laterality: N/A;  . ESOPHAGOGASTRODUODENOSCOPY (EGD) WITH PROPOFOL N/A 11/02/2016   Procedure: ESOPHAGOGASTRODUODENOSCOPY (EGD) WITH PROPOFOL;  Surgeon: Carol Ada, MD;  Location: [facility] ENDOSCOPY;  Service: Endoscopy;  Laterality: N/A;  . ESOPHAGOGASTRODUODENOSCOPY (EGD) WITH PROPOFOL N/A 08/05/2018   Procedure: ESOPHAGOGASTRODUODENOSCOPY (EGD) WITH PROPOFOL;  Surgeon: Rush Landmark Telford Nab., MD;  Location: [place];  Service: Gastroenterology;  Laterality: N/A;  . ESOPHAGOGASTRODUODENOSCOPY (EGD) WITH PROPOFOL N/A 07/14/2019   Procedure: ESOPHAGOGASTRODUODENOSCOPY (EGD) WITH PROPOFOL;  Surgeon: Otis Brace, MD;  Location: [facility] ENDOSCOPY;  Service: Gastroenterology;  Laterality: N/A;  . HERNIA REPAIR    . LEG SURGERY    . POLYPECTOMY  08/07/2018   Procedure: POLYPECTOMY;  Surgeon: Thornton Park, MD;  Location: [facility] ENDOSCOPY;  Service: Gastroenterology;;    Prior to Admission medications   Medication Sig Start Date End Date Taking? Authorizing Provider  ferrous sulfate 325 (65 FE) MG tablet Take 1 tablet (325 mg total) by mouth daily with breakfast. Patient not taking: Reported on 09/23/2018 08/09/18 10/09/18  Elodia Florence., MD    Scheduled Meds: . folic acid  1 mg Oral Daily  . multivitamin with minerals  1 tablet Oral Daily  . nicotine  21 mg Transdermal Daily  . thiamine  100 mg Oral Daily   Or  . thiamine  100 mg Intravenous Daily   Continuous Infusions: . sodium  chloride 125 mL/hr at 06/25/20 0720  . octreotide  (SANDOSTATIN)    IV infusion 50 mcg/hr (06/25/20 0423)  . pantoprozole (PROTONIX) infusion 8 mg/hr (06/25/20 0126)   PRN Meds:.LORazepam **OR** LORazepam  Allergies as of 06/24/2020 - Review Complete 06/24/2020  Allergen Reaction Noted  . Tomato Shortness Of Breath and Nausea And Vomiting 02/28/2017  . Aspirin Nausea And Vomiting 11/21/2015  . Sulfa antibiotics Other (See Comments) 06/21/2019    Family History  Problem Relation Age of Onset  . Diabetes Mother   . Alcoholism Mother   . Emphysema Father   . Lung cancer Father   . Alcoholism Father     Social History   Socioeconomic History  . Marital status: Divorced    Spouse name: Not on file  . Number of children: Not on file  . Years of education: Not on file  . Highest education level: Not on file  Occupational History  . Not on file  Tobacco Use  . Smoking status: Current Every Day Smoker    Packs/day: 1.00    Types: Cigarettes  . Smokeless tobacco: Never Used  Vaping Use  . Vaping Use: Never used  Substance and Sexual Activity  . Alcohol use: Yes    Comment: 5+ BEERS DAILY, Patient does not know how much he drank tonight  . Drug use: Not Currently    Frequency: 3.0 times per week    Types: Cocaine  . Sexual activity: Not Currently  Other Topics Concern  . Not on file  Social History Narrative  . Not on file   Social Determinants of Health   Financial Resource Strain: Not on file  Food Insecurity: Not on file  Transportation Needs: Not on file  Physical Activity: Not on file  Stress: Not on file  Social Connections: Not on file  Intimate Partner Violence: Not on file    Review of Systems: Review of Systems  Constitutional: Positive for malaise/fatigue. Negative for chills and fever.  HENT: Negative for hearing loss and tinnitus.   Eyes: Negative for pain and redness.  Respiratory: Negative for cough and shortness of breath.   Cardiovascular:  Negative for chest pain and palpitations.  Gastrointestinal: Positive for abdominal pain, diarrhea and melena. Negative for blood in stool, constipation, heartburn, nausea and vomiting.  Genitourinary: Negative for flank pain and hematuria.  Musculoskeletal: Negative for falls and joint pain.  Skin: Negative for itching and rash.  Neurological: Negative for seizures and loss of consciousness.  Endo/Heme/Allergies: Negative for polydipsia. Does not bruise/bleed easily.  Psychiatric/Behavioral: Negative for substance abuse. The patient is not nervous/anxious.      Physical Exam: Vital signs: Vitals:   06/25/20 0600 06/25/20 0859  BP: 138/78 138/78  Pulse: 82 82  Resp: 16   Temp:    SpO2: 95%       Physical Exam Vitals reviewed.  Constitutional:      General: He is not in acute distress. HENT:     Head: Normocephalic and atraumatic.     Nose: Nose normal. No congestion.  Eyes:     General: No scleral icterus.    Extraocular Movements: Extraocular movements intact.  Cardiovascular:     Rate and Rhythm: Normal rate and regular rhythm.     Pulses: Normal pulses.  Pulmonary:     Effort: Pulmonary effort  is normal. No respiratory distress.  Abdominal:     General: Bowel sounds are normal. There is distension (mild).     Palpations: Abdomen is soft. There is no mass.     Tenderness: There is abdominal tenderness (mild, diffuse). There is no guarding or rebound.     Hernia: No hernia is present.  Musculoskeletal:        General: No swelling or tenderness.     Cervical back: Normal range of motion and neck supple.  Skin:    General: Skin is warm and dry.     Coloration: Skin is not jaundiced.  Neurological:     General: No focal deficit present.     Mental Status: He is oriented to person, place, and time. He is lethargic.  Psychiatric:        Mood and Affect: Mood normal.        Behavior: Behavior normal. Behavior is cooperative.     GI:  Lab Results: Recent Labs     06/24/20 2340 06/25/20 0405 06/25/20 0721  WBC 3.8* 3.2* 3.3*  HGB 8.8* 8.3* 8.3*  HCT 29.0* 27.3* 27.2*  PLT 88* 78* 79*   BMET Recent Labs    06/24/20 2340  NA 141  K 3.8  CL 105  CO2 24  GLUCOSE 100*  BUN 6  CREATININE 0.69  CALCIUM 8.7*   LFT Recent Labs    06/24/20 2340  PROT 8.2*  ALBUMIN 4.3  AST 139*  ALT 60*  ALKPHOS 121  BILITOT 0.6   PT/INR Recent Labs    06/24/20 2340  LABPROT 12.8  INR 1.0     Studies/Results: No results found.  Impression: Suspected upper GI bleeding: Hgb 8.3, stable -History of small grade I esophageal varices -Normal BUN 6/ Cr 0.69  Suspected underlying cirrhosis:  -Platelets 79 K/uL -AST 139/ ALT 60  Seizures  Alcohol use disorder  Plan: EGD tomorrow.  CT abdomen/pelvis for ongoing abdominal pain.  Continue IV octreotide and Protonix.  Transfuse 1u platelets, given thrombocytopenia.  Clear liquid diet OK from a GI standpoint.  NPO after midnight.  Continue to monitor H&H with transfusion as needed to maintain Hgb >7.  Recommend social work consult.  [facility] will follow.   LOS: 0 days   Salley Slaughter  PA-C 06/25/2020, 10:09 AM  Contact #  [phone]

## 2020-06-25 NOTE — Plan of Care (Signed)

## 2020-06-25 NOTE — H&P (Signed)
History and Physical    Barry Moore HUT:[id number] DOB: January 23, 1966 DOA: 06/24/2020  PCP: Patient, No Pcp Per (Inactive)  Chief Complaint: Seizures, melena  HPI: Barry Moore is a 55 y.o. male with medical history significant of homelessness, alcohol abuse, alcoholic hepatitis, probable cirrhosis, history of upper GI bleed, pancreatitis, seizures, hypertension, depression, ongoing tobacco use, chronic thrombocytopenia presented to the ED for evaluation of 2 episodes of self-reported seizures, not witnessed by anyone.  Alert and oriented with EMS, not postictal.  He also complained of melena.  In the ED, hemodynamically stable.  Labs showing WBC 3.8, hemoglobin 8.8, platelet count 88K.  Sodium 141, potassium 3.8, chloride 105, bicarb 24, BUN 6, creatinine 0.6, glucose 100.  Transaminases chronically elevated (AST 139, ALT 60).  Alk phos and T bili normal.  Magnesium level within normal range.  Blood ethanol level elevated at 375.  INR 1.0.  Covid and influenza PCR negative.  UDS negative.  UA not suggestive of infection. Stool melenic on exam and guaiac positive.  Patient was started on IV PPI bolus and infusion.  Started on CIWA protocol.  Patient states he had 2 seizures back-to-back tonight and his body was shaking.  Since yesterday he has had dark stools.  States he vomited some blood a few days ago and it happened again last night.  He drinks 6 or 7 beers daily and smokes 1 pack of cigarettes daily.  Denies chest pain or abdominal pain.  No additional history could be obtained from him.  Review of Systems:  All systems reviewed and apart from history of presenting illness, are negative.  Past Medical History:  Diagnosis Date  . Alcoholic hepatitis   . Depression   . ETOH abuse   . Gastric bleed 08/2018  . Hypertension   . Pancreatitis   . Seizures ([place])   . Suicidal behavior     Past Surgical History:  Procedure Laterality Date  . BIOPSY  08/05/2018   Procedure: BIOPSY;  Surgeon:  Rush Landmark Telford Nab., MD;  Location: [place];  Service: Gastroenterology;;  . BIOPSY  08/07/2018   Procedure: BIOPSY;  Surgeon: Thornton Park, MD;  Location: [facility] ENDOSCOPY;  Service: Gastroenterology;;  . BIOPSY  01/02/2019   Procedure: BIOPSY;  Surgeon: Gatha Mayer, MD;  Location: [facility] ENDOSCOPY;  Service: Endoscopy;;  . COLONOSCOPY WITH PROPOFOL N/A 08/07/2018   Procedure: COLONOSCOPY WITH PROPOFOL;  Surgeon: Thornton Park, MD;  Location: [place];  Service: Gastroenterology;  Laterality: N/A;  . ESOPHAGOGASTRODUODENOSCOPY N/A 01/02/2019   Procedure: ESOPHAGOGASTRODUODENOSCOPY (EGD);  Surgeon: Gatha Mayer, MD;  Location: [facility];  Service: Endoscopy;  Laterality: N/A;  . ESOPHAGOGASTRODUODENOSCOPY N/A 07/03/2019   Procedure: ESOPHAGOGASTRODUODENOSCOPY (EGD);  Surgeon: Wonda Horner, MD;  Location: [facility] ENDOSCOPY;  Service: Endoscopy;  Laterality: N/A;  . ESOPHAGOGASTRODUODENOSCOPY (EGD) WITH PROPOFOL N/A 11/02/2016   Procedure: ESOPHAGOGASTRODUODENOSCOPY (EGD) WITH PROPOFOL;  Surgeon: Carol Ada, MD;  Location: [facility] ENDOSCOPY;  Service: Endoscopy;  Laterality: N/A;  . ESOPHAGOGASTRODUODENOSCOPY (EGD) WITH PROPOFOL N/A 08/05/2018   Procedure: ESOPHAGOGASTRODUODENOSCOPY (EGD) WITH PROPOFOL;  Surgeon: Rush Landmark Telford Nab., MD;  Location: [place];  Service: Gastroenterology;  Laterality: N/A;  . ESOPHAGOGASTRODUODENOSCOPY (EGD) WITH PROPOFOL N/A 07/14/2019   Procedure: ESOPHAGOGASTRODUODENOSCOPY (EGD) WITH PROPOFOL;  Surgeon: Otis Brace, MD;  Location: [facility] ENDOSCOPY;  Service: Gastroenterology;  Laterality: N/A;  . HERNIA REPAIR    . LEG SURGERY    . POLYPECTOMY  08/07/2018   Procedure: POLYPECTOMY;  Surgeon: Thornton Park, MD;  Location: [place];  Service: Gastroenterology;;  reports that he has been smoking cigarettes. He has been smoking about 1.00 pack per day. He has never used smokeless tobacco. He reports current alcohol use. He reports  previous drug use. Frequency: 3.00 times per week. Drug: Cocaine.  Allergies  Allergen Reactions  . Tomato Shortness Of Breath and Nausea And Vomiting  . Aspirin Nausea And Vomiting  . Sulfa Antibiotics Other (See Comments)    unknown    Family History  Problem Relation Age of Onset  . Diabetes Mother   . Alcoholism Mother   . Emphysema Father   . Lung cancer Father   . Alcoholism Father     Prior to Admission medications   Medication Sig Start Date End Date Taking? Authorizing Provider  ferrous sulfate 325 (65 FE) MG tablet Take 1 tablet (325 mg total) by mouth daily with breakfast. Patient not taking: Reported on 09/23/2018 08/09/18 10/09/18  Elodia Florence., MD    Physical Exam: Vitals:   06/25/20 0200 06/25/20 0230 06/25/20 0300 06/25/20 0330  BP: 124/71 140/72 107/62 129/65  Pulse: 81 77 79 93  Resp: _0 Temp:      TempSrc:      SpO2: 90% 94% 96% 93%  Weight:      Height:        Physical Exam Constitutional:      General: He is not in acute distress. HENT:     Head: Normocephalic and atraumatic.  Eyes:     Extraocular Movements: Extraocular movements intact.     Conjunctiva/sclera: Conjunctivae normal.  Cardiovascular:     Rate and Rhythm: Normal rate and regular rhythm.     Pulses: Normal pulses.  Pulmonary:     Effort: Pulmonary effort is normal. No respiratory distress.     Breath sounds: Normal breath sounds. No wheezing or rales.  Abdominal:     General: Bowel sounds are normal. There is no distension.     Palpations: Abdomen is soft.     Tenderness: There is no abdominal tenderness.  Musculoskeletal:        General: No swelling or tenderness.     Cervical back: Normal range of motion and neck supple.  Skin:    General: Skin is warm and dry.  Neurological:     General: No focal deficit present.     Mental Status: He is alert and oriented to person, place, and time.     Labs on Admission: I have personally reviewed following labs  and imaging studies  CBC: Recent Labs  Lab 06/24/20 2340  WBC 3.8*  NEUTROABS 1.2*  HGB 8.8*  HCT 29.0*  MCV 71.8*  PLT 88*   Basic Metabolic Panel: Recent Labs  Lab 06/24/20 2340  NA 141  K 3.8  CL 105  CO2 24  GLUCOSE 100*  BUN 6  CREATININE 0.69  CALCIUM 8.7*  MG 2.2   GFR: Estimated Creatinine Clearance: 97.5 mL/min (by C-G formula based on SCr of 0.69 mg/dL). Liver Function Tests: Recent Labs  Lab 06/24/20 2340  AST 139*  ALT 60*  ALKPHOS 121  BILITOT 0.6  PROT 8.2*  ALBUMIN 4.3   No results for input(s): LIPASE, AMYLASE in the last 168 hours. No results for input(s): AMMONIA in the last 168 hours. Coagulation Profile: Recent Labs  Lab 06/24/20 2340  INR 1.0   Cardiac Enzymes: No results for input(s): CKTOTAL, CKMB, CKMBINDEX, TROPONINI in the last 168 hours. BNP (last 3 results) No results for input(s): PROBNP  in the last 8760 hours. HbA1C: No results for input(s): HGBA1C in the last 72 hours. CBG: Recent Labs  Lab 06/24/20 2323  GLUCAP 124*   Lipid Profile: No results for input(s): CHOL, HDL, LDLCALC, TRIG, CHOLHDL, LDLDIRECT in the last 72 hours. Thyroid Function Tests: No results for input(s): TSH, T4TOTAL, FREET4, T3FREE, THYROIDAB in the last 72 hours. Anemia Panel: No results for input(s): VITAMINB12, FOLATE, FERRITIN, TIBC, IRON, RETICCTPCT in the last 72 hours. Urine analysis:    Component Value Date/Time   COLORURINE STRAW (A) 06/25/2020 0000   APPEARANCEUR CLEAR 06/25/2020 0000   LABSPEC 1.003 (L) 06/25/2020 0000   PHURINE 6.0 06/25/2020 0000   GLUCOSEU NEGATIVE 06/25/2020 0000   HGBUR NEGATIVE 06/25/2020 0000   BILIRUBINUR NEGATIVE 06/25/2020 0000   KETONESUR NEGATIVE 06/25/2020 0000   PROTEINUR NEGATIVE 06/25/2020 0000   NITRITE NEGATIVE 06/25/2020 0000   LEUKOCYTESUR NEGATIVE 06/25/2020 0000    Radiological Exams on Admission: No results found.  EKG: Independently reviewed.  Sinus rhythm, no acute  changes  Assessment/Plan Principal Problem:   GIB (gastrointestinal bleeding) Active Problems:   Thrombocytopenia (HCC)   Tobacco abuse   Alcohol abuse   Blood loss anemia   Acute upper GI bleed Acute blood loss anemia Patient with history of ongoing daily heavy alcohol use.  Presenting with complaints of melena and hematemesis.  No active hematemesis since he has been in the ED.  Stool melenic on exam and guaiac positive.  Hemoglobin 8.8, previously in the 7-9 range.  Hemodynamically stable.  Abdominal exam benign.  He was admitted a year ago for GI bleed and EGD done at that time revealed grade 1 esophageal varices, gastritis, and chronic duodenitis. -Currently hemodynamically stable.  Keep n.p.o. Continue IV PPI.  Start octreotide. IV fluid hydration, monitor hemodynamics.  Type and screen, serial H&H.  Transfuse if hemoglobin less than 7.  Discussed with Dr. Michail Sermon via secure chat, [facility] will consult in the morning.  Alcohol abuse with acute intoxication Blood ethanol level elevated at the time of admission but at risk for withdrawal during this hospitalization when his blood ethanol level starts dropping. -Started on CIWA protocol; Ativan as needed.  Thiamine, folate, and multivitamin.  ?Seizures Reported by the patient and he remembers what happened.  Not postictal when EMS arrived.  Alcohol withdrawal seizure less likely as his blood ethanol level was elevated on labs done in the ED.  UDS negative.  Magnesium level normal. -Seizure precautions, EEG  Homelessness -Consult social work  Tobacco use -NicoDerm patch and counseling  Acute on chronic thrombocytopenia Likely due to alcohol use. Platelet count currently 88K, was in the 130 range about a week ago. -Continue to monitor closely  Hypertension -Hold antihypertensives in the setting of acute GI bleed.  DVT prophylaxis: SCDs Code Status: Full code Family Communication: No family available. Disposition Plan:  Status is: Observation  The patient remains OBS appropriate and will d/c before 2 midnights.  Dispo: The patient is from: Homeless              Anticipated d/c is to: Pending social work assessment               Patient currently is not medically stable to d/c.   Difficult to place patient No  Level of care: Stepdown  The medical decision making on this patient was of high complexity and the patient is at high risk for clinical deterioration, therefore this is a level 3 visit.  Shela Leff MD [facility]  If 7PM-7AM, please contact night-coverage [email or web address]  06/25/2020, 3:56 AM

## 2020-06-25 NOTE — Anesthesia Preprocedure Evaluation (Addendum)
Anesthesia Evaluation  Patient identified by MRN, date of birth, ID band Patient awake    Reviewed: Allergy & Precautions, NPO status , Patient's Chart, lab work & pertinent test results  Airway Mallampati: III  TM Distance: >3 FB Neck ROM: Full   Comment: Full Beard Dental no notable dental hx. (+) Edentulous Lower, Edentulous Upper   Pulmonary Current Smoker,    Pulmonary exam normal breath sounds clear to auscultation       Cardiovascular hypertension, Normal cardiovascular exam Rhythm:Regular Rate:Normal     Neuro/Psych Seizures -,  Depression    GI/Hepatic (+)     substance abuse  alcohol use, Hepatitis -  Endo/Other    Renal/GU Lab Results      Component                Value               Date                      CREATININE               0.69                06/24/2020                BUN                      6                   06/24/2020                NA                       141                 06/24/2020                K                        3.8                 06/24/2020                CL                       105                 06/24/2020                CO2                      24                  06/24/2020                Musculoskeletal   Abdominal   Peds  Hematology  (+) Blood dyscrasia (Thrombocytopenias), anemia , Lab Results      Component                Value               Date                      WBC  3.4 (L)             06/25/2020                HGB                      8.1 (L)             06/25/2020                HCT                      26.8 (L)            06/25/2020                MCV                      72.6 (L)            06/25/2020                PLT                      75 (L)              06/25/2020              Anesthesia Other Findings All: ASA Sulfa ZTomato  Reproductive/Obstetrics                            Anesthesia  Physical Anesthesia Plan  ASA: III  Anesthesia Plan: MAC   Post-op Pain Management:    Induction:   PONV Risk Score and Plan: Treatment may vary due to age or medical condition  Airway Management Planned: Natural Airway  Additional Equipment: None  Intra-op Plan:   Post-operative Plan:   Informed Consent: I have reviewed the patients History and Physical, chart, labs and discussed the procedure including the risks, benefits and alternatives for the proposed anesthesia with the patient or authorized representative who has indicated his/her understanding and acceptance.     Dental advisory given  Plan Discussed with: CRNA  Anesthesia Plan Comments: (UGI Bleed  For EGD)       Anesthesia Quick Evaluation

## 2020-06-25 NOTE — ED Provider Notes (Signed)
Patient signed out pending work-up.  He does have some objective evidence of likely an upper GI bleed.  History of chronic alcohol abuse and likely has gastritis versus gastric ulcer.  Hemoglobin 8.8 but he is heme positive from below.  Had several episodes prior to arrival which she is unsure whether they were seizures.  However his blood alcohol level is 375.  Suspect acute intoxication.  He was placed on CIWA protocol as he is at risk for withdrawing when his alcohol level drops.  Given concern for acute bleed, will admit for observation for serial hemoglobins.  He was given Protonix by prior provider.  Physical Exam  BP (!) 120/58   Pulse 86   Temp 97.9 F (36.6 C) (Oral)   Resp 17   Ht 1.702 m (5\' 7" )   Wt 68 kg   SpO2 93%   BMI 23.48 kg/m   Problem List Items Addressed This Visit      Other   Alcohol abuse (Chronic)    Other Visit Diagnoses    UGI bleed    -  Primary          Merryl Hacker, MD 06/25/20 [phone]

## 2020-06-26 ENCOUNTER — Encounter (HOSPITAL_COMMUNITY)
Admission: EM | Disposition: A | Discharge status: Home / Self Care | Payer: Self-pay | Source: Home / Self Care | Attending: Internal Medicine

## 2020-06-26 ENCOUNTER — Inpatient Hospital Stay (HOSPITAL_COMMUNITY): Payer: Self-pay | Admitting: Anesthesiology

## 2020-06-26 ENCOUNTER — Encounter (HOSPITAL_COMMUNITY): Payer: Self-pay | Admitting: Internal Medicine

## 2020-06-26 ENCOUNTER — Other Ambulatory Visit: Payer: Self-pay

## 2020-06-26 HISTORY — PX: ESOPHAGOGASTRODUODENOSCOPY: SHX5428

## 2020-06-26 LAB — BPAM PLATELET PHERESIS
Blood Product Expiration Date: 202204082359
ISSUE DATE / TIME: 202204061656
Unit Type and Rh: 5100

## 2020-06-26 LAB — PREPARE PLATELET PHERESIS: Unit division: 0

## 2020-06-26 SURGERY — EGD (ESOPHAGOGASTRODUODENOSCOPY)
Anesthesia: Monitor Anesthesia Care

## 2020-06-26 MED ORDER — PANTOPRAZOLE SODIUM 40 MG PO TBEC
40.0000 mg | DELAYED_RELEASE_TABLET | Freq: Two times a day (BID) | ORAL | Status: DC
  Administered 2020-06-26 – 2020-06-27 (×2): 40 mg via ORAL
  Filled 2020-06-26 (×3): qty 1

## 2020-06-26 MED ORDER — PROPOFOL 500 MG/50ML IV EMUL
INTRAVENOUS | Status: DC | PRN
  Administered 2020-06-26: 375 ug/kg/min via INTRAVENOUS

## 2020-06-26 MED ORDER — GLYCOPYRROLATE 0.2 MG/ML IJ SOLN
INTRAMUSCULAR | Status: DC | PRN
  Administered 2020-06-26: .1 mg via INTRAVENOUS

## 2020-06-26 MED ORDER — LIDOCAINE HCL (CARDIAC) PF 100 MG/5ML IV SOSY
PREFILLED_SYRINGE | INTRAVENOUS | Status: DC | PRN
  Administered 2020-06-26: 75 mg via INTRAVENOUS

## 2020-06-26 NOTE — Progress Notes (Signed)
Pt ambulated to bathroom with assistance. Tolerated well. Pt had a medium amount of dark brown stool.

## 2020-06-26 NOTE — Anesthesia Postprocedure Evaluation (Signed)
Anesthesia Post Note  Patient: Barry Moore  Procedure(s) Performed: ESOPHAGOGASTRODUODENOSCOPY (EGD) (N/A )     Patient location during evaluation: Endoscopy Anesthesia Type: MAC Level of consciousness: awake and alert Pain management: pain level controlled Vital Signs Assessment: post-procedure vital signs reviewed and stable Respiratory status: spontaneous breathing, nonlabored ventilation, respiratory function stable and patient connected to nasal cannula oxygen Cardiovascular status: blood pressure returned to baseline and stable Postop Assessment: no apparent nausea or vomiting Anesthetic complications: no   No complications documented.  Last Vitals:  Vitals:   06/26/20 1022 06/26/20 1044  BP: (!) 144/62 (!) 154/82  Pulse: 80 69  Resp: (!) 25 15  Temp:  36.7 C  SpO2: 93% 98%    Last Pain:  Vitals:   06/26/20 1044  TempSrc: Axillary  PainSc:                  Barnet Glasgow

## 2020-06-26 NOTE — Anesthesia Procedure Notes (Signed)
Procedure Name: MAC Date/Time: 06/26/2020 9:42 AM Performed by: Lissa Morales, CRNA Pre-anesthesia Checklist: Patient identified, Emergency Drugs available, Suction available, Patient being monitored and Timeout performed Patient Re-evaluated:Patient Re-evaluated prior to induction Oxygen Delivery Method: Simple face mask Preoxygenation: Pre-oxygenation with 100% oxygen (POM mask) Placement Confirmation: positive ETCO2

## 2020-06-26 NOTE — Progress Notes (Signed)
Patient back form endo

## 2020-06-26 NOTE — Interval H&P Note (Signed)
History and Physical Interval Note:  06/26/2020 9:35 AM  Barry Moore  has presented today for surgery, with the diagnosis of upper GI bleeding.  The various methods of treatment have been discussed with the patient and family. After consideration of risks, benefits and other options for treatment, the patient has consented to  Procedure(s): ESOPHAGOGASTRODUODENOSCOPY (EGD) (N/A) as a surgical intervention.  The patient's history has been reviewed, patient examined, no change in status, stable for surgery.  I have reviewed the patient's chart and labs.  Questions were answered to the patient's satisfaction.     Landry Dyke

## 2020-06-26 NOTE — Transfer of Care (Signed)
Immediate Anesthesia Transfer of Care Note  Patient: Barry Moore  Procedure(s) Performed: ESOPHAGOGASTRODUODENOSCOPY (EGD) (N/A )  Patient Location: PACU  Anesthesia Type:MAC  Level of Consciousness: awake, alert , oriented and patient cooperative  Airway & Oxygen Therapy: Patient Spontanous Breathing and Patient connected to face mask oxygen  Post-op Assessment: Report given to RN, Post -op Vital signs reviewed and stable and Patient moving all extremities X 4  Post vital signs: stable  Last Vitals:  Vitals Value Taken Time  BP 143/73 06/26/20 1008  Temp 37.3 C 06/26/20 1008  Pulse 83 06/26/20 1011  Resp 21 06/26/20 1011  SpO2 100 % 06/26/20 1011  Vitals shown include unvalidated device data.  Last Pain:  Vitals:   06/26/20 1008  TempSrc: Axillary  PainSc: 0-No pain         Complications: No complications documented.

## 2020-06-26 NOTE — Progress Notes (Signed)
[facility] PROGRESS NOTE    Progress Note  Barry Moore  OEV:[id number] DOB: Jan 20, 1966 DOA: 06/24/2020 PCP: Patient, No Pcp Per (Inactive)     Brief Narrative:   Barry Moore is an 55 y.o. male past medical history significant for alcohol abuse alcoholic hepatitis, cirrhosis history of GI bleed homelessness pancreatitis depression ongoing tobacco abuse chronic thrombocytopenia comes into the ED for 2 episodes of unwitnessed seizure, EMS arrived not postictal he is also complaining of melena white count of 3.8, hemoglobin 8.8 ( hemoglobin 12/12/2019 was 10.2) platelet count 88 he relates to back-to-back seizures, and started having melanotic stools yesterday.  He could has an ongoing use of alcohol.  Denies any abdominal pain or chest pain.  Orthostatics are positive in the ED.   ignificant studies: none  Antibiotics: None  Microbiology data: Blood culture:  Procedures: None  Assessment/Plan:   Melanotic stools: No active emesis in the ED.  Guaiac positive, hemoglobin on admission of 8.8 last year in September was 10. GI was consulted EGD was done that showed grade B esophagitis portal gastropathy and erosive gastropathy no signs of bleeding and mild duodenitis. Continue Protonix IV twice daily. Soft diet stop octreotide only Protonix for 6 weeks follow-up with GI as an outpatient. He has been informed that he needs to stop drinking. Hemoglobin has remained stable.  Alcohol abuse with intoxication: Alcohol level 375 on admission no signs of withdrawal continue to monitor with thiamine and folate. Continue CIWA protocol.  Questionable seizures: Was not postictal when EMS arrived alcohol withdrawal less likely. UDS is negative, mag is stable.  Homelessness: Environmental education officer.  Acute on chronic thrombocytopenia: Likely due to heavy alcohol abuse continue to monitor platelet counts if below 50 will transfuse in the setting of GI bleed.  Essential  hypertension: Continue to monitor hold all antihypertensive medication.  Tobacco abuse: Continue cutting patch. DVT prophylaxis: scd Family Communication:none Status is: Observation  The patient will require care spanning > 2 midnights and should be moved to inpatient because: Hemodynamically unstable  Dispo: The patient is from: Home              Anticipated d/c is to: Home              Patient currently is not medically stable to d/c.   Difficult to place patient No        Code Status:     Code Status Orders  (From admission, onward)         Start     Ordered   06/25/20 0350  Full code  Continuous        06/25/20 0353        Code Status History    Date Active Date Inactive Code Status Order ID Comments User Context   06/25/2020 0002 06/25/2020 0353 Full Code [id number]  Merryl Hacker, MD ED   06/17/2020 1900 06/18/2020 2150 Full Code [id number]  Carlisle Cater, PA-C ED   06/10/2020 0459 06/10/2020 1547 Full Code [id number]  Fatima Blank, MD ED   05/30/2020 2153 05/31/2020 1932 Full Code [id number]  Carmin Muskrat, MD ED   12/28/2019 0520 12/28/2019 1701 Full Code [id number]  Malachy Mood, PA ED   12/27/2019 2305 12/28/2019 0447 Full Code [id number]  Blanchie Dessert, MD ED   12/12/2019 2210 12/13/2019 1741 Full Code [id number]  Rozetta Nunnery, NP ED   07/21/2019 0528 07/21/2019 1312 Full Code [id number]  Delora Fuel, MD ED   07/19/2019 0755 07/20/2019 2047 Full  Code [id number]  Vernelle Emerald, MD ED   07/14/2019 0331 07/15/2019 1831 Full Code [id number]  Vianne Bulls, MD Inpatient   07/06/2019 0434 07/06/2019 1655 Full Code [id number]  Delora Fuel, MD ED   07/02/2019 1356 07/04/2019 1440 Full Code [id number]  Vianne Bulls, MD ED   04/29/2019 0256 04/29/2019 1651 Full Code [id number]  Palumbo, April, MD ED   04/29/2019 0209 04/29/2019 0256 Full Code [id number]  Sherryle Lis, Mia A, PA-C ED   01/01/2019 0119 01/03/2019 1748 Full Code [id number]  Shela Leff, MD ED   12/13/2018  0303 12/14/2018 1910 Full Code [id number]  Rise Patience, MD ED   11/19/2018 2112 11/20/2018 1551 Full Code [id number]  Fransico Meadow, PA-C ED   10/19/2018 0225 10/19/2018 1721 Full Code [id number]  McDonald, Mia A, PA-C ED   09/23/2018 1322 09/24/2018 1551 Full Code [id number]  Robinson, [place] N, PA-C ED   09/15/2018 1354 09/16/2018 1231 Full Code [id number]  Delia Heady, PA-C ED   08/31/2018 0039 09/01/2018 1754 Full Code [id number]  Etta Quill, DO ED   08/20/2018 2307 08/21/2018 0955 Full Code [id number]  Long, Wonda Olds, MD ED   08/18/2018 0001 08/18/2018 1654 Full Code [id number]  [name], MD ED   12/01/2017 2317 12/02/2017 1902 Full Code [id number]  Kinnie Feil, PA-C ED   09/29/2017 1102 09/30/2017 1520 Full Code [id number]  Valarie Merino, MD ED   09/29/2017 0505 09/29/2017 1102 Full Code [id number]  Orpah Greek, MD ED   09/01/2017 1801 09/02/2017 1422 Full Code [id number]  Valarie Merino, MD ED   08/24/2017 0652 08/24/2017 1532 Full Code [id number]  Veryl Speak, MD ED   08/19/2017 0546 08/19/2017 1119 Full Code [id number]  Shanon Rosser, MD ED   08/13/2017 1741 08/14/2017 1643 Full Code [id number]  Jillyn Ledger, PA-C ED   08/06/2017 2137 08/07/2017 1626 Full Code [id number]  Hayden Rasmussen, MD ED   07/16/2017 2158 07/18/2017 1415 Full Code [id number]  Ethelene Hal, NP Inpatient   07/15/2017 2332 07/16/2017 2135 Full Code [id number]  Blanchie Dessert, MD ED   06/24/2017 2147 06/26/2017 1410 Full Code [id number]  Joanne Gavel, PA-C ED   06/17/2017 2149 06/19/2017 1349 Full Code [id number]  Pisciotta, Charna Elizabeth ED   06/07/2017 0628 06/07/2017 1645 Full Code [id number]  Antonietta Breach, PA-C ED   06/03/2017 0218 06/03/2017 0909 Full Code [id number]  Ward, Delice Bison, DO ED   06/01/2017 0020 06/01/2017 0940 Full Code [id number]  Montine Circle, PA-C ED   05/21/2017 2209 05/22/2017 0919 Full Code [id number]  Recardo Evangelist, PA-C ED   05/15/2017 1435 05/19/2017 1452 Full Code [id number]  Rozetta Nunnery, NP Inpatient   05/14/2017 2028 05/15/2017 1301 Full Code [id number]  Merrily Pew, MD ED   05/06/2017 0335 05/06/2017 0912 Full Code [id number]  Ward, Delice Bison, DO ED   04/28/2017 0849 04/28/2017 1549 Full Code [id number]  Patrecia Pour, NP ED   04/27/2017 0755 04/28/2017 0849 Full Code [id number]  Varney Biles, MD ED   04/23/2017 0708 04/23/2017 1126 Full Code [id number]  Delora Fuel, MD ED   03/18/2017 0532 03/19/2017 1910 Full Code [id number]  Etta Quill, DO ED   02/09/2017 0106 02/12/2017 1311 Full Code [id number]  Reubin Milan, MD Inpatient   12/28/2016 1801 12/30/2016 1735 Full Code [id number]  Daleen Bo, MD ED   11/22/2016 2306 11/23/2016 1732 Full Code [id number]  Ward, [name], PA-C ED   11/18/2016 [id number]  11/18/2016 1425 Full Code [id number]  Orpah Greek, MD ED   11/01/2016 1310 11/03/2016 2130 Full Code [id number]  Merton Border, MD ED   11/01/2016 1244 11/01/2016 1310 Full Code [id number]  Merton Border, MD ED   10/15/2016 0504 10/15/2016 1902 Full Code [id number]  Street, [place], [place] ED   08/19/2016 1625 08/20/2016 1359 Full Code [id number]  Kinnie Feil, PA-C ED   08/13/2016 1557 08/14/2016 2031 Full Code [id number]  Blanchie Dessert, MD ED   07/06/2016 1200 07/12/2016 1828 Full Code [id number]  Patrecia Pour, NP Inpatient   07/05/2016 2230 07/06/2016 1132 Full Code [id number]  Drenda Freeze, MD ED   06/15/2016 2347 06/23/2016 1854 Full Code [id number]  Laverle Hobby, PA-C Inpatient   06/14/2016 2207 06/15/2016 2312 Full Code [id number]  Tanna Furry, MD ED   05/26/2016 1955 05/29/2016 1547 Full Code [id number]  Ivor Costa, MD ED   05/26/2016 1804 05/26/2016 1955 Full Code [id number]  Renita Papa, [place] ED   03/05/2016 1826 03/09/2016 2112 Full Code [id number]  Benjamine Mola, [place] Inpatient   03/05/2016 1826 03/05/2016 1826 Full Code [id number]  Benjamine Mola, [place] Inpatient   03/04/2016 1838 03/05/2016 1626 Full Code [id number]  Doristine Devoid, PA-C ED   02/14/2016 2335 02/19/2016 1534  Full Code [id number]  Edwin Dada, MD ED   02/14/2016 1323 02/14/2016 2335 Full Code [id number]  Lacretia Leigh, MD ED   Advance Care Planning Activity        IV Access:    Peripheral IV   Procedures and diagnostic studies:   CT ABDOMEN PELVIS W CONTRAST  Result Date: 06/25/2020 CLINICAL DATA:  Acute nonlocalized abdominal pain. Recent seizures. History of hepatitis, probable cirrhosis, upper GI bleed and pancreatitis. EXAM: CT ABDOMEN AND PELVIS WITH CONTRAST TECHNIQUE: Multidetector CT imaging of the abdomen and pelvis was performed using the standard protocol following bolus administration of intravenous contrast. CONTRAST:  163mL OMNIPAQUE IOHEXOL 300 MG/ML  SOLN COMPARISON:  Abdominopelvic CT 07/02/2019. FINDINGS: Lower chest: Stable mild chronic dependent atelectasis or scarring in both lung bases. No significant pleural or pericardial effusion. There is atherosclerosis of the aorta and coronary arteries. There is a small hiatal hernia. Hepatobiliary: The hepatic density is diffusely decreased, consistent with steatosis. No focal abnormality or abnormal enhancement. There is a small dependent calcified gallstone. No evidence of gallbladder wall thickening or biliary dilatation. Pancreas: Unremarkable. No pancreatic ductal dilatation or surrounding inflammatory changes. Spleen: Normal in size without focal abnormality. Adrenals/Urinary Tract: Both adrenal glands appear normal. There is new bilateral hydronephrosis and hydroureter associated with marked distension of the urinary bladder. No bladder wall thickening or surrounding inflammation identified. There is no evidence of urinary tract calculus. There is no renal mass or asymmetric perinephric soft tissue stranding. The previously demonstrated soft tissue stranding anterior to the right kidney has resolved. Delayed images demonstrate delayed excretion into the collecting systems. Stomach/Bowel: No enteric contrast administered. The  stomach appears unremarkable for its degree of distension. No evidence of bowel wall thickening, distention or surrounding inflammatory change. The appendix appears normal. Vascular/Lymphatic: There are no enlarged abdominal or pelvic lymph nodes. Mild aortic and branch vessel atherosclerosis. The portal, superior mesenteric and splenic veins are patent. Reproductive: The prostate gland and seminal vesicles appear normal. Other: No evidence of abdominal wall mass or hernia. No ascites. Musculoskeletal: No acute or significant osseous findings. Degenerative disc disease at L5-S1. IMPRESSION: 1. Marked distention of the urinary bladder with bilateral hydroureter and mild delayed contrast  excretion. No evidence of ureteral calculus. Findings suggest bladder outlet obstruction of undetermined etiology. 2. No other acute findings. No evidence of active GI bleed, inflammation or bowel obstruction. 3. Hepatic steatosis. 4. Cholelithiasis without cholecystitis or biliary dilatation. 5.  Aortic Atherosclerosis (ICD10-I70.0). Electronically Signed   By: Richardean Sale M.D.   On: 06/25/2020 14:08     Medical Consultants:    None.   Subjective:    Barry Moore denies any abdominal pain would like to try a soft diet.  Objective:    Vitals:   06/26/20 0843 06/26/20 1008 06/26/20 1015 06/26/20 1022  BP: (!) 174/93 (!) 143/73 (!) 143/70 (!) 144/62  Pulse: 76 73 87 80  Resp: 19 19 15  (!) 25  Temp:  99.1 F (37.3 C)    TempSrc:  Axillary    SpO2: 96% 100% 100% 93%  Weight: 77.5 kg     Height: 5\' 7"  (1.702 m)      SpO2: 93 % O2 Flow Rate (L/min): 4 L/min   Intake/Output Summary (Last 24 hours) at 06/26/2020 1023 Last data filed at 06/26/2020 0639 Gross per 24 hour  Intake 1494.74 ml  Output 2025 ml  Net -530.26 ml   Filed Weights   06/24/20 2254 06/25/20 1444 06/26/20 0843  Weight: 68 kg 77.5 kg 77.5 kg    Exam: General exam: In no acute distress. Respiratory system: Good air movement and  clear to auscultation. Cardiovascular system: S1 & S2 heard, RRR. No JVD. Gastrointestinal system: Abdomen is nondistended, soft and nontender.  Extremities: No pedal edema. Skin: No rashes, lesions or ulcers Psychiatry: Judgement and insight appear normal. Mood & affect appropriate.   Data Reviewed:    Labs: Basic Metabolic Panel: Recent Labs  Lab 06/24/20 2340  NA 141  K 3.8  CL 105  CO2 24  GLUCOSE 100*  BUN 6  CREATININE 0.69  CALCIUM 8.7*  MG 2.2   GFR Estimated Creatinine Clearance: 97.5 mL/min (by C-G formula based on SCr of 0.69 mg/dL). Liver Function Tests: Recent Labs  Lab 06/24/20 2340  AST 139*  ALT 60*  ALKPHOS 121  BILITOT 0.6  PROT 8.2*  ALBUMIN 4.3   No results for input(s): LIPASE, AMYLASE in the last 168 hours. No results for input(s): AMMONIA in the last 168 hours. Coagulation profile Recent Labs  Lab 06/24/20 2340  INR 1.0   COVID-19 Labs  No results for input(s): DDIMER, FERRITIN, LDH, CRP in the last 72 hours.  Lab Results  Component Value Date   SARSCOV2NAA NEGATIVE 06/24/2020   [place] NEGATIVE 06/17/2020   [place] NEGATIVE 06/10/2020   [place] NEGATIVE 05/30/2020    CBC: Recent Labs  Lab 06/24/20 2340 06/25/20 0405 06/25/20 0721 06/25/20 1206  WBC 3.8* 3.2* 3.3* 3.4*  NEUTROABS 1.2*  --   --   --   HGB 8.8* 8.3* 8.3* 8.1*  HCT 29.0* 27.3* 27.2* 26.8*  MCV 71.8* 71.8* 72.1* 72.6*  PLT 88* 78* 79* 75*   Cardiac Enzymes: No results for input(s): CKTOTAL, CKMB, CKMBINDEX, TROPONINI in the last 168 hours. BNP (last 3 results) No results for input(s): PROBNP in the last 8760 hours. CBG: Recent Labs  Lab 06/24/20 2323  GLUCAP 124*   D-Dimer: No results for input(s): DDIMER in the last 72 hours. Hgb A1c: No results for input(s): HGBA1C in the last 72 hours. Lipid Profile: No results for input(s): CHOL, HDL, LDLCALC, TRIG, CHOLHDL, LDLDIRECT in the last 72 hours. Thyroid function studies: No results  for input(s):  TSH, T4TOTAL, T3FREE, THYROIDAB in the last 72 hours.  Invalid input(s): FREET3 Anemia work up: No results for input(s): VITAMINB12, FOLATE, FERRITIN, TIBC, IRON, RETICCTPCT in the last 72 hours. Sepsis Labs: Recent Labs  Lab 06/24/20 2340 06/25/20 0405 06/25/20 0721 06/25/20 1206  WBC 3.8* 3.2* 3.3* 3.4*   Microbiology Recent Results (from the past 240 hour(s))  Resp Panel by RT-PCR (Flu A&B, Covid) Nasopharyngeal Swab     Status: None   Collection Time: 06/17/20  7:30 PM   Specimen: Nasopharyngeal Swab; Nasopharyngeal(NP) swabs in vial transport medium  Result Value Ref Range Status   SARS Coronavirus 2 by RT PCR NEGATIVE NEGATIVE Final    Comment: (NOTE) SARS-CoV-2 target nucleic acids are NOT DETECTED.  The SARS-CoV-2 RNA is generally detectable in upper respiratory specimens during the acute phase of infection. The lowest concentration of SARS-CoV-2 viral copies this assay can detect is 138 copies/mL. A negative result does not preclude SARS-Cov-2 infection and should not be used as the sole basis for treatment or other patient management decisions. A negative result may occur with  improper specimen collection/handling, submission of specimen other than nasopharyngeal swab, presence of viral mutation(s) within the areas targeted by this assay, and inadequate number of viral copies(<138 copies/mL). A negative result must be combined with clinical observations, patient history, and epidemiological information. The expected result is Negative.  Fact Sheet for Patients:  EntrepreneurPulse.com.au  Fact Sheet for Healthcare Providers:  IncredibleEmployment.be  This test is no t yet approved or cleared by the [place] FDA and  has been authorized for detection and/or diagnosis of SARS-CoV-2 by FDA under an Emergency Use Authorization (EUA). This EUA will remain  in effect (meaning this test can be used) for the  duration of the COVID-19 declaration under Section 564(b)(1) of the Act, 21 U.S.C.section 360bbb-3(b)(1), unless the authorization is terminated  or revoked sooner.       Influenza A by PCR NEGATIVE NEGATIVE Final   Influenza B by PCR NEGATIVE NEGATIVE Final    Comment: (NOTE) The Xpert Xpress SARS-CoV-2/FLU/RSV plus assay is intended as an aid in the diagnosis of influenza from Nasopharyngeal swab specimens and should not be used as a sole basis for treatment. Nasal washings and aspirates are unacceptable for Xpert Xpress SARS-CoV-2/FLU/RSV testing.  Fact Sheet for Patients: EntrepreneurPulse.com.au  Fact Sheet for Healthcare Providers: IncredibleEmployment.be  This test is not yet approved or cleared by the [place] FDA and has been authorized for detection and/or diagnosis of SARS-CoV-2 by FDA under an Emergency Use Authorization (EUA). This EUA will remain in effect (meaning this test can be used) for the duration of the COVID-19 declaration under Section 564(b)(1) of the Act, 21 U.S.C. section 360bbb-3(b)(1), unless the authorization is terminated or revoked.  Performed at [facility], [place] [address] [place], [place] [address]   Resp Panel by RT-PCR (Flu A&B, Covid) Nasopharyngeal Swab     Status: None   Collection Time: 06/24/20 11:47 PM   Specimen: Nasopharyngeal Swab; Nasopharyngeal(NP) swabs in vial transport medium  Result Value Ref Range Status   SARS Coronavirus 2 by RT PCR NEGATIVE NEGATIVE Final    Comment: (NOTE) SARS-CoV-2 target nucleic acids are NOT DETECTED.  The SARS-CoV-2 RNA is generally detectable in upper respiratory specimens during the acute phase of infection. The lowest concentration of SARS-CoV-2 viral copies this assay can detect is 138 copies/mL. A negative result does not preclude SARS-Cov-2 infection and should not be used as the sole basis for treatment  or other patient  management decisions. A negative result may occur with  improper specimen collection/handling, submission of specimen other than nasopharyngeal swab, presence of viral mutation(s) within the areas targeted by this assay, and inadequate number of viral copies(<138 copies/mL). A negative result must be combined with clinical observations, patient history, and epidemiological information. The expected result is Negative.  Fact Sheet for Patients:  EntrepreneurPulse.com.au  Fact Sheet for Healthcare Providers:  IncredibleEmployment.be  This test is no t yet approved or cleared by the [place] FDA and  has been authorized for detection and/or diagnosis of SARS-CoV-2 by FDA under an Emergency Use Authorization (EUA). This EUA will remain  in effect (meaning this test can be used) for the duration of the COVID-19 declaration under Section 564(b)(1) of the Act, 21 U.S.C.section 360bbb-3(b)(1), unless the authorization is terminated  or revoked sooner.       Influenza A by PCR NEGATIVE NEGATIVE Final   Influenza B by PCR NEGATIVE NEGATIVE Final    Comment: (NOTE) The Xpert Xpress SARS-CoV-2/FLU/RSV plus assay is intended as an aid in the diagnosis of influenza from Nasopharyngeal swab specimens and should not be used as a sole basis for treatment. Nasal washings and aspirates are unacceptable for Xpert Xpress SARS-CoV-2/FLU/RSV testing.  Fact Sheet for Patients: EntrepreneurPulse.com.au  Fact Sheet for Healthcare Providers: IncredibleEmployment.be  This test is not yet approved or cleared by the [place] FDA and has been authorized for detection and/or diagnosis of SARS-CoV-2 by FDA under an Emergency Use Authorization (EUA). This EUA will remain in effect (meaning this test can be used) for the duration of the COVID-19 declaration under Section 564(b)(1) of the Act, 21 U.S.C. section 360bbb-3(b)(1),  unless the authorization is terminated or revoked.  Performed at [facility], [place] [address], [place], [place] [address]      Medications:   . [MAR Hold] sodium chloride   Intravenous Once  . [MAR Hold] folic acid  1 mg Oral Daily  . [MAR Hold] multivitamin with minerals  1 tablet Oral Daily  . [MAR Hold] nicotine  21 mg Transdermal Daily  . pantoprazole  40 mg Oral BID AC  . [MAR Hold] thiamine  100 mg Oral Daily   Or  . [facility] Hold] thiamine  100 mg Intravenous Daily   Continuous Infusions: . sodium chloride 400 mL/hr at 06/26/20 1008      LOS: 1 day   Charlynne Cousins  [facility]  06/26/2020, 10:23 AM

## 2020-06-26 NOTE — TOC Progression Note (Signed)
Transition of Care [facility]) - Progression Note    Patient Details  Name: Barry Moore MRN: [id number] Date of Birth: Apr 15, 1965  [facility] CM/SW Contact  Purcell Mouton, RN Phone Number: 06/26/2020, 2:36 PM  Clinical Narrative:    TOC reviewed chart. At present time there are no Home Health needs. Pt will return to [facility].    Expected Discharge Plan: Homeless Shelter Barriers to Discharge: No Barriers Identified  Expected Discharge Plan and Services Expected Discharge Plan: Homeless Shelter       Living arrangements for the past 2 months: Homeless                                       Social Determinants of Health (SDOH) Interventions    Readmission Risk Interventions Readmission Risk Prevention Plan 07/20/2019 01/03/2019 09/01/2018  Transportation Screening Complete Complete Complete  PCP or Specialist Appt within 5-7 Days - - -  Home Care Screening - - -  Medication Review (RN CM) - - -  Medication Review ([place]) Complete Complete Complete  PCP or Specialist appointment within 3-5 days of discharge - Not Complete Complete  PCP/Specialist Appt Not Complete comments - Got first available appointments -  [place] or [place] - Not Complete Complete  SW Recovery Care/Counseling Consult Complete Not Complete Complete  Palliative Care Screening Not Applicable Not Applicable Not Applicable  Skilled Nursing Facility Not Applicable Not Applicable Not Applicable  Some recent data might be hidden

## 2020-06-26 NOTE — Op Note (Signed)
[facility] Patient Name: Barry Moore Procedure Date: 06/26/2020 MRN: [id number] Attending MD: Arta Silence , MD Date of Birth: 27-Jan-1966 CSN: [id number] Age: 55 Admit Type: Inpatient Procedure:                Upper GI endoscopy Indications:              Acute post hemorrhagic anemia, Melena Providers:                Arta Silence, MD, Carmie End, RN, Brooke                            Person, Tyrone Apple, Technician, Enrigue Catena,                            CRNA Referring MD:              Medicines:                Monitored Anesthesia Care Complications:            No immediate complications. Estimated Blood Loss:     Estimated blood loss: none. Procedure:                Pre-Anesthesia Assessment:                           - Prior to the procedure, a History and Physical                            was performed, and patient medications and                            allergies were reviewed. The patient's tolerance of                            previous anesthesia was also reviewed. The risks                            and benefits of the procedure and the sedation                            options and risks were discussed with the patient.                            All questions were answered, and informed consent                            was obtained. Prior Anticoagulants: The patient has                            taken no previous anticoagulant or antiplatelet                            agents except for aspirin. ASA Grade Assessment:                            III -  A patient with severe systemic disease. After                            reviewing the risks and benefits, the patient was                            deemed in satisfactory condition to undergo the                            procedure.                           After obtaining informed consent, the endoscope was                            passed under direct vision. Throughout the                             procedure, the patient's blood pressure, pulse, and                            oxygen saturations were monitored continuously. The                            [id number]-[id number] ([id number] Olympus gastroscope was                            introduced through the mouth, and advanced to the                            second part of duodenum. The upper GI endoscopy was                            accomplished without difficulty. The patient                            tolerated the procedure well. Scope In: Scope Out: Findings:      LA Grade B (one or more mucosal breaks greater than 5 mm, not extending       between the tops of two mucosal folds) esophagitis was found.      The exam of the esophagus was otherwise normal. No esopahgeal varices.      Moderate portal hypertensive gastropathy was found in the gastric body       and in the gastric antrum.      A few dispersed small erosions with no stigmata of recent bleeding were       found in the gastric antrum and in the prepyloric region of the stomach.      The exam of the stomach was otherwise normal. No gastric varices.      Patchy mild inflammation characterized by congestion (edema), erosions       and erythema was found in the duodenal bulb.      The exam of the duodenum was otherwise normal. Impression:               - LA Grade B esophagitis.                           -  Portal hypertensive gastropathy.                           - Erosive gastropathy with no stigmata of recent                            bleeding.                           - Duodenitis. Moderate Sedation:      Not Applicable - Patient had care per Anesthesia. Recommendation:           - Return patient to hospital ward for ongoing care.                           - Soft diet today.                           - Stop octreotide.                           - Pantoprazole 40 mg po bid x 6 weeks, then 40 mg                            po qd thereafter  indefinitely.                           - Alcohol abstinence.                           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                           - [facility] will sign-off; please call with any                            questions; thank you for the consultation.                           - Patient has had multiple endoscopies for similar                            reasons in the past. Would not repeat any future                            endoscopies unless patient is having destabilizing                            bright red hematemesis. Procedure Code(s):        --- Professional ---  53646, Esophagogastroduodenoscopy, flexible,                            transoral; diagnostic, including collection of                            specimen(s) by brushing or washing, when performed                            (separate procedure) Diagnosis Code(s):        --- Professional ---                           K20.90, Esophagitis, unspecified without bleeding                           K76.6, Portal hypertension                           K31.89, Other diseases of stomach and duodenum                           K29.80, Duodenitis without bleeding                           D62, Acute posthemorrhagic anemia                           K92.1, Melena (includes Hematochezia) CPT copyright 2019 [employer]. All rights reserved. The codes documented in this report are preliminary and upon coder review may  be revised to meet current compliance requirements. Arta Silence, MD 06/26/2020 10:11:29 AM This report has been signed electronically. Number of Addenda: 0

## 2020-06-27 DIAGNOSIS — F1010 Alcohol abuse, uncomplicated: Secondary | ICD-10-CM

## 2020-06-27 MED ORDER — PANTOPRAZOLE SODIUM 40 MG PO TBEC: 40.0000 mg | DELAYED_RELEASE_TABLET | Freq: Two times a day (BID) | ORAL | 3 refills | Status: DC

## 2020-06-27 MED ORDER — AMLODIPINE BESYLATE 5 MG PO TABS: 5.0000 mg | ORAL_TABLET | Freq: Every day | ORAL | 11 refills | Status: DC

## 2020-06-27 NOTE — Plan of Care (Signed)
  Problem: Education: Goal: Knowledge of General Education information will improve Description: Including pain rating scale, medication(s)/side effects and non-pharmacologic comfort measures Outcome: Progressing   Problem: Activity: Goal: Risk for activity intolerance will decrease Outcome: Progressing   Problem: Clinical Measurements: Goal: Ability to maintain clinical measurements within normal limits will improve Outcome: Progressing Goal: Will remain free from infection Outcome: Progressing Goal: Diagnostic test results will improve Outcome: Progressing Goal: Respiratory complications will improve Outcome: Progressing Goal: Cardiovascular complication will be avoided Outcome: Progressing   

## 2020-06-27 NOTE — Discharge Summary (Signed)
Physician Discharge Summary  Barry Moore ZOX:[id number] DOB: Aug 09, 1965 DOA: 06/24/2020  PCP: Patient, No Pcp Per (Inactive)  Admit date: 06/24/2020 Discharge date: 06/27/2020  Admitted From: Home Disposition:  Home  Recommendations for Outpatient Follow-up:  1. Follow up with GI 2-4 weeks 2. Please obtain BMP/CBC in one week 3.   Home Health:No Equipment/Devices:None  Discharge Condition:Stable CODE STATUS:fULL Diet recommendation: Heart Healthy  Brief/Interim Summary:  55 y.o. male past medical history significant for alcohol abuse alcoholic hepatitis, cirrhosis history of GI bleed homelessness pancreatitis depression ongoing tobacco abuse chronic thrombocytopenia comes into the ED for 2 episodes of unwitnessed seizure, EMS arrived not postictal he is also complaining of melena white count of 3.8, hemoglobin 8.8 ( hemoglobin 12/12/2019 was 10.2) platelet count 88 he relates to back-to-back seizures, and started having melanotic stools yesterday.  He could has an ongoing use of alcohol.  Denies any abdominal pain or chest pain.  Orthostatics are positive in the ED.  Discharge Diagnoses:  Principal Problem:   GIB (gastrointestinal bleeding) Active Problems:   Thrombocytopenia (HCC)   Tobacco abuse   Alcohol abuse   GI bleed   Blood loss anemia   Acute GI bleeding  Acute GI bleed/melanotic stool probably due to mild duodenitis: Guaiac positive in the ED hemoglobin in September was 10 on admission 8.8. GI was consulted to perform an EGD that showed grade B esophagitis, portal gastropathy and erosive gastropathy no signs of bleeding with mild duodenitis. On admission he was started on Protonix twice a day. He was tolerating his diet was advanced will continue Protonix p.o. twice daily follow-up with GI as an outpatient his hemoglobin remained stable.  Alcohol abuse with intoxication: alcohol level 75 on admission he was monitored with CIWA protocol thiamine thiamine and folate no  signs of withdrawal.  Questionable seizures: Was not postictal when EMS arrived, UDS was negative medically stable. He had no seizure events in the hospital.  Homelessness: Noted.  Acute on chronic thrombocytopenia: Very due to alcohol abuse.  Elevated blood pressure without a diagnosis of hypertension now with newly diagnosed hypertension Not on any antihypertensive medications at home, his blood pressure remain mildly elevated follow-up with his physician as an outpatient. He was started on Norvasc low-dose.   Discharge Instructions  Discharge Instructions    Diet - low sodium heart healthy   Complete by: As directed    Increase activity slowly   Complete by: As directed      Allergies as of 06/27/2020      Reactions   Tomato Shortness Of Breath, Nausea And Vomiting   Aspirin Nausea And Vomiting   Sulfa Antibiotics Other (See Comments)   unknown      Medication List    TAKE these medications   pantoprazole 40 MG tablet Commonly known as: PROTONIX Take 1 tablet (40 mg total) by mouth 2 (two) times daily before a meal.       Allergies  Allergen Reactions  . Tomato Shortness Of Breath and Nausea And Vomiting  . Aspirin Nausea And Vomiting  . Sulfa Antibiotics Other (See Comments)    unknown    Consultations: Gastroenterology  Procedures/Studies: CT ABDOMEN PELVIS W CONTRAST  Result Date: 06/25/2020 CLINICAL DATA:  Acute nonlocalized abdominal pain. Recent seizures. History of hepatitis, probable cirrhosis, upper GI bleed and pancreatitis. EXAM: CT ABDOMEN AND PELVIS WITH CONTRAST TECHNIQUE: Multidetector CT imaging of the abdomen and pelvis was performed using the standard protocol following bolus administration of intravenous contrast. CONTRAST:  134mL OMNIPAQUE  IOHEXOL 300 MG/ML  SOLN COMPARISON:  Abdominopelvic CT 07/02/2019. FINDINGS: Lower chest: Stable mild chronic dependent atelectasis or scarring in both lung bases. No significant pleural or pericardial  effusion. There is atherosclerosis of the aorta and coronary arteries. There is a small hiatal hernia. Hepatobiliary: The hepatic density is diffusely decreased, consistent with steatosis. No focal abnormality or abnormal enhancement. There is a small dependent calcified gallstone. No evidence of gallbladder wall thickening or biliary dilatation. Pancreas: Unremarkable. No pancreatic ductal dilatation or surrounding inflammatory changes. Spleen: Normal in size without focal abnormality. Adrenals/Urinary Tract: Both adrenal glands appear normal. There is new bilateral hydronephrosis and hydroureter associated with marked distension of the urinary bladder. No bladder wall thickening or surrounding inflammation identified. There is no evidence of urinary tract calculus. There is no renal mass or asymmetric perinephric soft tissue stranding. The previously demonstrated soft tissue stranding anterior to the right kidney has resolved. Delayed images demonstrate delayed excretion into the collecting systems. Stomach/Bowel: No enteric contrast administered. The stomach appears unremarkable for its degree of distension. No evidence of bowel wall thickening, distention or surrounding inflammatory change. The appendix appears normal. Vascular/Lymphatic: There are no enlarged abdominal or pelvic lymph nodes. Mild aortic and branch vessel atherosclerosis. The portal, superior mesenteric and splenic veins are patent. Reproductive: The prostate gland and seminal vesicles appear normal. Other: No evidence of abdominal wall mass or hernia. No ascites. Musculoskeletal: No acute or significant osseous findings. Degenerative disc disease at L5-S1. IMPRESSION: 1. Marked distention of the urinary bladder with bilateral hydroureter and mild delayed contrast excretion. No evidence of ureteral calculus. Findings suggest bladder outlet obstruction of undetermined etiology. 2. No other acute findings. No evidence of active GI bleed,  inflammation or bowel obstruction. 3. Hepatic steatosis. 4. Cholelithiasis without cholecystitis or biliary dilatation. 5.  Aortic Atherosclerosis (ICD10-I70.0). Electronically Signed   By: Richardean Sale M.D.   On: 06/25/2020 14:08    (Echo, Carotid, EGD, Colonoscopy, ERCP)    Subjective: No complaints feels great.  Discharge Exam: Vitals:   06/26/20 2053 06/27/20 0545  BP: (!) 151/75 (!) 154/89  Pulse: 85 72  Resp: 17 16  Temp: 98.7 F (37.1 C) 98.4 F (36.9 C)  SpO2: 97% 97%   Vitals:   06/26/20 1325 06/26/20 1700 06/26/20 2053 06/27/20 0545  BP: (!) 141/81 (!) 160/82 (!) 151/75 (!) 154/89  Pulse: 73 75 85 72  Resp: 14  17 16   Temp: 99.2 F (37.3 C)  98.7 F (37.1 C) 98.4 F (36.9 C)  TempSrc: Oral  Oral Oral  SpO2: 96%  97% 97%  Weight:      Height:        General: Pt is alert, awake, not in acute distress Cardiovascular: RRR, S1/S2 +, no rubs, no gallops Respiratory: CTA bilaterally, no wheezing, no rhonchi Abdominal: Soft, NT, ND, bowel sounds + Extremities: no edema, no cyanosis    The results of significant diagnostics from this hospitalization (including imaging, microbiology, ancillary and laboratory) are listed below for reference.     Microbiology: Recent Results (from the past 240 hour(s))  Resp Panel by RT-PCR (Flu A&B, Covid) Nasopharyngeal Swab     Status: None   Collection Time: 06/17/20  7:30 PM   Specimen: Nasopharyngeal Swab; Nasopharyngeal(NP) swabs in vial transport medium  Result Value Ref Range Status   SARS Coronavirus 2 by RT PCR NEGATIVE NEGATIVE Final    Comment: (NOTE) SARS-CoV-2 target nucleic acids are NOT DETECTED.  The SARS-CoV-2 RNA is generally detectable in upper  respiratory specimens during the acute phase of infection. The lowest concentration of SARS-CoV-2 viral copies this assay can detect is 138 copies/mL. A negative result does not preclude SARS-Cov-2 infection and should not be used as the sole basis for treatment  or other patient management decisions. A negative result may occur with  improper specimen collection/handling, submission of specimen other than nasopharyngeal swab, presence of viral mutation(s) within the areas targeted by this assay, and inadequate number of viral copies(<138 copies/mL). A negative result must be combined with clinical observations, patient history, and epidemiological information. The expected result is Negative.  Fact Sheet for Patients:  EntrepreneurPulse.com.au  Fact Sheet for Healthcare Providers:  IncredibleEmployment.be  This test is no t yet approved or cleared by the [place] FDA and  has been authorized for detection and/or diagnosis of SARS-CoV-2 by FDA under an Emergency Use Authorization (EUA). This EUA will remain  in effect (meaning this test can be used) for the duration of the COVID-19 declaration under Section 564(b)(1) of the Act, 21 U.S.C.section 360bbb-3(b)(1), unless the authorization is terminated  or revoked sooner.       Influenza A by PCR NEGATIVE NEGATIVE Final   Influenza B by PCR NEGATIVE NEGATIVE Final    Comment: (NOTE) The Xpert Xpress SARS-CoV-2/FLU/RSV plus assay is intended as an aid in the diagnosis of influenza from Nasopharyngeal swab specimens and should not be used as a sole basis for treatment. Nasal washings and aspirates are unacceptable for Xpert Xpress SARS-CoV-2/FLU/RSV testing.  Fact Sheet for Patients: EntrepreneurPulse.com.au  Fact Sheet for Healthcare Providers: IncredibleEmployment.be  This test is not yet approved or cleared by the [place] FDA and has been authorized for detection and/or diagnosis of SARS-CoV-2 by FDA under an Emergency Use Authorization (EUA). This EUA will remain in effect (meaning this test can be used) for the duration of the COVID-19 declaration under Section 564(b)(1) of the Act, 21 U.S.C. section  360bbb-3(b)(1), unless the authorization is terminated or revoked.  Performed at [facility], [place] [address], [place], [place] [address]   Resp Panel by RT-PCR (Flu A&B, Covid) Nasopharyngeal Swab     Status: None   Collection Time: 06/24/20 11:47 PM   Specimen: Nasopharyngeal Swab; Nasopharyngeal(NP) swabs in vial transport medium  Result Value Ref Range Status   SARS Coronavirus 2 by RT PCR NEGATIVE NEGATIVE Final    Comment: (NOTE) SARS-CoV-2 target nucleic acids are NOT DETECTED.  The SARS-CoV-2 RNA is generally detectable in upper respiratory specimens during the acute phase of infection. The lowest concentration of SARS-CoV-2 viral copies this assay can detect is 138 copies/mL. A negative result does not preclude SARS-Cov-2 infection and should not be used as the sole basis for treatment or other patient management decisions. A negative result may occur with  improper specimen collection/handling, submission of specimen other than nasopharyngeal swab, presence of viral mutation(s) within the areas targeted by this assay, and inadequate number of viral copies(<138 copies/mL). A negative result must be combined with clinical observations, patient history, and epidemiological information. The expected result is Negative.  Fact Sheet for Patients:  EntrepreneurPulse.com.au  Fact Sheet for Healthcare Providers:  IncredibleEmployment.be  This test is no t yet approved or cleared by the [place] FDA and  has been authorized for detection and/or diagnosis of SARS-CoV-2 by FDA under an Emergency Use Authorization (EUA). This EUA will remain  in effect (meaning this test can be used) for the duration of the COVID-19 declaration under Section 564(b)(1) of the  Act, 21 U.S.C.section 360bbb-3(b)(1), unless the authorization is terminated  or revoked sooner.       Influenza A by PCR NEGATIVE NEGATIVE Final   Influenza B by  PCR NEGATIVE NEGATIVE Final    Comment: (NOTE) The Xpert Xpress SARS-CoV-2/FLU/RSV plus assay is intended as an aid in the diagnosis of influenza from Nasopharyngeal swab specimens and should not be used as a sole basis for treatment. Nasal washings and aspirates are unacceptable for Xpert Xpress SARS-CoV-2/FLU/RSV testing.  Fact Sheet for Patients: EntrepreneurPulse.com.au  Fact Sheet for Healthcare Providers: IncredibleEmployment.be  This test is not yet approved or cleared by the [place] FDA and has been authorized for detection and/or diagnosis of SARS-CoV-2 by FDA under an Emergency Use Authorization (EUA). This EUA will remain in effect (meaning this test can be used) for the duration of the COVID-19 declaration under Section 564(b)(1) of the Act, 21 U.S.C. section 360bbb-3(b)(1), unless the authorization is terminated or revoked.  Performed at [facility], [place] [address] [place], [place] [address]      Labs: BNP (last 3 results) No results for input(s): BNP in the last 8760 hours. Basic Metabolic Panel: Recent Labs  Lab 06/24/20 2340  NA 141  K 3.8  CL 105  CO2 24  GLUCOSE 100*  BUN 6  CREATININE 0.69  CALCIUM 8.7*  MG 2.2   Liver Function Tests: Recent Labs  Lab 06/24/20 2340  AST 139*  ALT 60*  ALKPHOS 121  BILITOT 0.6  PROT 8.2*  ALBUMIN 4.3   No results for input(s): LIPASE, AMYLASE in the last 168 hours. No results for input(s): AMMONIA in the last 168 hours. CBC: Recent Labs  Lab 06/24/20 2340 06/25/20 0405 06/25/20 0721 06/25/20 1206  WBC 3.8* 3.2* 3.3* 3.4*  NEUTROABS 1.2*  --   --   --   HGB 8.8* 8.3* 8.3* 8.1*  HCT 29.0* 27.3* 27.2* 26.8*  MCV 71.8* 71.8* 72.1* 72.6*  PLT 88* 78* 79* 75*   Cardiac Enzymes: No results for input(s): CKTOTAL, CKMB, CKMBINDEX, TROPONINI in the last 168 hours. BNP: Invalid input(s): POCBNP CBG: Recent Labs  Lab 06/24/20 2323  GLUCAP  124*   D-Dimer No results for input(s): DDIMER in the last 72 hours. Hgb A1c No results for input(s): HGBA1C in the last 72 hours. Lipid Profile No results for input(s): CHOL, HDL, LDLCALC, TRIG, CHOLHDL, LDLDIRECT in the last 72 hours. Thyroid function studies No results for input(s): TSH, T4TOTAL, T3FREE, THYROIDAB in the last 72 hours.  Invalid input(s): FREET3 Anemia work up No results for input(s): VITAMINB12, FOLATE, FERRITIN, TIBC, IRON, RETICCTPCT in the last 72 hours. Urinalysis    Component Value Date/Time   COLORURINE STRAW (A) 06/25/2020 0000   APPEARANCEUR CLEAR 06/25/2020 0000   LABSPEC 1.003 (L) 06/25/2020 0000   PHURINE 6.0 06/25/2020 0000   GLUCOSEU NEGATIVE 06/25/2020 0000   HGBUR NEGATIVE 06/25/2020 0000   BILIRUBINUR NEGATIVE 06/25/2020 0000   KETONESUR NEGATIVE 06/25/2020 0000   PROTEINUR NEGATIVE 06/25/2020 0000   NITRITE NEGATIVE 06/25/2020 0000   LEUKOCYTESUR NEGATIVE 06/25/2020 0000   Sepsis Labs Invalid input(s): PROCALCITONIN,  WBC,  LACTICIDVEN Microbiology Recent Results (from the past 240 hour(s))  Resp Panel by RT-PCR (Flu A&B, Covid) Nasopharyngeal Swab     Status: None   Collection Time: 06/17/20  7:30 PM   Specimen: Nasopharyngeal Swab; Nasopharyngeal(NP) swabs in vial transport medium  Result Value Ref Range Status   SARS Coronavirus 2 by RT PCR NEGATIVE NEGATIVE Final  Comment: (NOTE) SARS-CoV-2 target nucleic acids are NOT DETECTED.  The SARS-CoV-2 RNA is generally detectable in upper respiratory specimens during the acute phase of infection. The lowest concentration of SARS-CoV-2 viral copies this assay can detect is 138 copies/mL. A negative result does not preclude SARS-Cov-2 infection and should not be used as the sole basis for treatment or other patient management decisions. A negative result may occur with  improper specimen collection/handling, submission of specimen other than nasopharyngeal swab, presence of viral  mutation(s) within the areas targeted by this assay, and inadequate number of viral copies(<138 copies/mL). A negative result must be combined with clinical observations, patient history, and epidemiological information. The expected result is Negative.  Fact Sheet for Patients:  EntrepreneurPulse.com.au  Fact Sheet for Healthcare Providers:  IncredibleEmployment.be  This test is no t yet approved or cleared by the [place] FDA and  has been authorized for detection and/or diagnosis of SARS-CoV-2 by FDA under an Emergency Use Authorization (EUA). This EUA will remain  in effect (meaning this test can be used) for the duration of the COVID-19 declaration under Section 564(b)(1) of the Act, 21 U.S.C.section 360bbb-3(b)(1), unless the authorization is terminated  or revoked sooner.       Influenza A by PCR NEGATIVE NEGATIVE Final   Influenza B by PCR NEGATIVE NEGATIVE Final    Comment: (NOTE) The Xpert Xpress SARS-CoV-2/FLU/RSV plus assay is intended as an aid in the diagnosis of influenza from Nasopharyngeal swab specimens and should not be used as a sole basis for treatment. Nasal washings and aspirates are unacceptable for Xpert Xpress SARS-CoV-2/FLU/RSV testing.  Fact Sheet for Patients: EntrepreneurPulse.com.au  Fact Sheet for Healthcare Providers: IncredibleEmployment.be  This test is not yet approved or cleared by the [place] FDA and has been authorized for detection and/or diagnosis of SARS-CoV-2 by FDA under an Emergency Use Authorization (EUA). This EUA will remain in effect (meaning this test can be used) for the duration of the COVID-19 declaration under Section 564(b)(1) of the Act, 21 U.S.C. section 360bbb-3(b)(1), unless the authorization is terminated or revoked.  Performed at [facility], [place] [address] [place], [place] [address]   Resp Panel by RT-PCR  (Flu A&B, Covid) Nasopharyngeal Swab     Status: None   Collection Time: 06/24/20 11:47 PM   Specimen: Nasopharyngeal Swab; Nasopharyngeal(NP) swabs in vial transport medium  Result Value Ref Range Status   SARS Coronavirus 2 by RT PCR NEGATIVE NEGATIVE Final    Comment: (NOTE) SARS-CoV-2 target nucleic acids are NOT DETECTED.  The SARS-CoV-2 RNA is generally detectable in upper respiratory specimens during the acute phase of infection. The lowest concentration of SARS-CoV-2 viral copies this assay can detect is 138 copies/mL. A negative result does not preclude SARS-Cov-2 infection and should not be used as the sole basis for treatment or other patient management decisions. A negative result may occur with  improper specimen collection/handling, submission of specimen other than nasopharyngeal swab, presence of viral mutation(s) within the areas targeted by this assay, and inadequate number of viral copies(<138 copies/mL). A negative result must be combined with clinical observations, patient history, and epidemiological information. The expected result is Negative.  Fact Sheet for Patients:  EntrepreneurPulse.com.au  Fact Sheet for Healthcare Providers:  IncredibleEmployment.be  This test is no t yet approved or cleared by the [place] FDA and  has been authorized for detection and/or diagnosis of SARS-CoV-2 by FDA under an Emergency Use Authorization (EUA). This EUA will remain  in effect (  meaning this test can be used) for the duration of the COVID-19 declaration under Section 564(b)(1) of the Act, 21 U.S.C.section 360bbb-3(b)(1), unless the authorization is terminated  or revoked sooner.       Influenza A by PCR NEGATIVE NEGATIVE Final   Influenza B by PCR NEGATIVE NEGATIVE Final    Comment: (NOTE) The Xpert Xpress SARS-CoV-2/FLU/RSV plus assay is intended as an aid in the diagnosis of influenza from Nasopharyngeal swab specimens  and should not be used as a sole basis for treatment. Nasal washings and aspirates are unacceptable for Xpert Xpress SARS-CoV-2/FLU/RSV testing.  Fact Sheet for Patients: EntrepreneurPulse.com.au  Fact Sheet for Healthcare Providers: IncredibleEmployment.be  This test is not yet approved or cleared by the [place] FDA and has been authorized for detection and/or diagnosis of SARS-CoV-2 by FDA under an Emergency Use Authorization (EUA). This EUA will remain in effect (meaning this test can be used) for the duration of the COVID-19 declaration under Section 564(b)(1) of the Act, 21 U.S.C. section 360bbb-3(b)(1), unless the authorization is terminated or revoked.  Performed at [facility], [place] [address], [place], [place] [address]      Time coordinating discharge: Over 30 minutes  SIGNED:   Charlynne Cousins, MD  [facility] 06/27/2020, 8:56 AM Pager   If 7PM-7AM, please contact night-coverage [email or web address] Password [email or web address]

## 2020-06-27 NOTE — TOC Progression Note (Addendum)
Transition of Care [facility]) - Progression Note    Patient Details  Name: Barry Moore MRN: [id number] Date of Birth: 1966/03/01  [facility] CM/SW Contact  Purcell Mouton, RN Phone Number: 06/27/2020, 10:25 AM  Clinical Narrative:    Spoke with pt concerning discharge needs. Pt states that he has a list of Homeless Shelters here in [place] and asked for a bus pass. Bus Pass given to pt.     Expected Discharge Plan: Homeless Shelter Barriers to Discharge: No Barriers Identified  Expected Discharge Plan and Services Expected Discharge Plan: Homeless Shelter       Living arrangements for the past 2 months: Homeless Expected Discharge Date: 06/27/20                                     Social Determinants of Health (SDOH) Interventions    Readmission Risk Interventions Readmission Risk Prevention Plan 07/20/2019 01/03/2019 09/01/2018  Transportation Screening Complete Complete Complete  PCP or Specialist Appt within 5-7 Days - - -  Home Care Screening - - -  Medication Review (RN CM) - - -  Medication Review (RN Transport planner) Complete Complete Complete  PCP or Specialist appointment within 3-5 days of discharge - Not Complete Complete  PCP/Specialist Appt Not Complete comments - Got first available appointments -  [place] or [place] - Not Complete Complete  SW Recovery Care/Counseling Consult Complete Not Complete Complete  Palliative Care Screening Not Applicable Not Applicable Not [place] Not Applicable Not Applicable Not Applicable  Some recent data might be hidden

## 2020-06-27 NOTE — Plan of Care (Signed)
Patient is ready for discharge. Transportation has been arranged and is outside the main hospital entrance. AVS has been discussed in great detail with the patient and all questions answered to include medication questions. Patient provided with education on new meds. All IV's removed personal belongings returned and patient. Patient Discharged.   Problem: Education: Goal: Knowledge of General Education information will improve Description: Including pain rating scale, medication(s)/side effects and non-pharmacologic comfort measures Outcome: Adequate for Discharge   Problem: Health Behavior/Discharge Planning: Goal: Ability to manage health-related needs will improve Outcome: Adequate for Discharge   Problem: Clinical Measurements: Goal: Ability to maintain clinical measurements within normal limits will improve Outcome: Adequate for Discharge Goal: Will remain free from infection Outcome: Adequate for Discharge Goal: Diagnostic test results will improve Outcome: Adequate for Discharge Goal: Respiratory complications will improve Outcome: Adequate for Discharge Goal: Cardiovascular complication will be avoided Outcome: Adequate for Discharge   Problem: Activity: Goal: Risk for activity intolerance will decrease Outcome: Adequate for Discharge   Problem: Nutrition: Goal: Adequate nutrition will be maintained Outcome: Adequate for Discharge   Problem: Coping: Goal: Level of anxiety will decrease Outcome: Adequate for Discharge   Problem: Elimination: Goal: Will not experience complications related to bowel motility Outcome: Adequate for Discharge Goal: Will not experience complications related to urinary retention Outcome: Adequate for Discharge   Problem: Pain Managment: Goal: General experience of comfort will improve Outcome: Adequate for Discharge   Problem: Safety: Goal: Ability to remain free from injury will improve Outcome: Adequate for Discharge   Problem: Skin  Integrity: Goal: Risk for impaired skin integrity will decrease Outcome: Adequate for Discharge

## 2020-06-30 ENCOUNTER — Encounter (HOSPITAL_COMMUNITY): Payer: Self-pay | Admitting: Gastroenterology

## 2020-08-04 ENCOUNTER — Encounter (HOSPITAL_COMMUNITY): Payer: Self-pay

## 2020-08-04 ENCOUNTER — Emergency Department (HOSPITAL_COMMUNITY): Payer: Medicaid Other

## 2020-08-04 ENCOUNTER — Emergency Department (HOSPITAL_COMMUNITY)
Admission: EM | Admit: 2020-08-04 | Discharge: 2020-08-05 | Disposition: A | Discharge status: Home / Self Care | Payer: Medicaid Other | Attending: Emergency Medicine | Admitting: Emergency Medicine

## 2020-08-04 ENCOUNTER — Other Ambulatory Visit: Payer: Self-pay

## 2020-08-04 DIAGNOSIS — F17210 Nicotine dependence, cigarettes, uncomplicated: Secondary | ICD-10-CM | POA: Insufficient documentation

## 2020-08-04 DIAGNOSIS — R569 Unspecified convulsions: Secondary | ICD-10-CM | POA: Insufficient documentation

## 2020-08-04 DIAGNOSIS — F10129 Alcohol abuse with intoxication, unspecified: Secondary | ICD-10-CM | POA: Insufficient documentation

## 2020-08-04 DIAGNOSIS — F10920 Alcohol use, unspecified with intoxication, uncomplicated: Secondary | ICD-10-CM

## 2020-08-04 DIAGNOSIS — M545 Low back pain, unspecified: Secondary | ICD-10-CM | POA: Insufficient documentation

## 2020-08-04 DIAGNOSIS — I10 Essential (primary) hypertension: Secondary | ICD-10-CM | POA: Insufficient documentation

## 2020-08-04 DIAGNOSIS — Y908 Blood alcohol level of 240 mg/100 ml or more: Secondary | ICD-10-CM | POA: Insufficient documentation

## 2020-08-04 LAB — CBC
HCT: 29.7 % — ABNORMAL LOW (ref 39.0–52.0)
Hemoglobin: 8.8 g/dL — ABNORMAL LOW (ref 13.0–17.0)
MCH: 21.7 pg — ABNORMAL LOW (ref 26.0–34.0)
MCHC: 29.6 g/dL — ABNORMAL LOW (ref 30.0–36.0)
MCV: 73.2 fL — ABNORMAL LOW (ref 80.0–100.0)
Platelets: 145 10*3/uL — ABNORMAL LOW (ref 150–400)
RBC: 4.06 MIL/uL — ABNORMAL LOW (ref 4.22–5.81)
RDW: 18.9 % — ABNORMAL HIGH (ref 11.5–15.5)
WBC: 5.3 10*3/uL (ref 4.0–10.5)
nRBC: 0 % (ref 0.0–0.2)

## 2020-08-04 LAB — BASIC METABOLIC PANEL
Anion gap: 7 (ref 5–15)
BUN: 6 mg/dL (ref 6–20)
CO2: 25 mmol/L (ref 22–32)
Calcium: 8.8 mg/dL — ABNORMAL LOW (ref 8.9–10.3)
Chloride: 103 mmol/L (ref 98–111)
Creatinine, Ser: 0.7 mg/dL (ref 0.61–1.24)
GFR, Estimated: >60 mL/min (ref >60)
Glucose, Bld: 100 mg/dL — ABNORMAL HIGH (ref 70–99)
Potassium: 3.7 mmol/L (ref 3.5–5.1)
Sodium: 135 mmol/L (ref 135–145)

## 2020-08-04 LAB — ETHANOL: Alcohol, Ethyl (B): 285 mg/dL — ABNORMAL HIGH (ref <10)

## 2020-08-04 NOTE — ED Provider Notes (Signed)
Emergency Medicine Provider Triage Evaluation Note  Barry Moore , a 55 y.o. male  was evaluated in triage.  Pt complains of seizure.  States that he is homeless and had a seizure in the woods today.  States that he has pain in his lower back and right side.  Admits to alcohol use.  States chronic suicidal ideations.  Review of Systems  Positive: Nausea and vomiting, seizure activity Negative: Headache  Physical Exam  BP 120/83 (BP Location: Left Arm)   Pulse 86   Temp 98.9 F (37.2 C) (Oral)   Resp 18   Ht 5\' 7"  (1.702 m)   Wt 77.5 kg   SpO2 99%   BMI 26.76 kg/m  Gen:   Awake, no distress   Resp:  Normal effort  MSK:   Moves extremities without difficulty, tender over the lumbar paraspinous musculature and right-sided lower back Other:  Speech is slurred  Medical Decision Making  Medically screening exam initiated at 10:01 PM.  Appropriate orders placed.  Barry Moore was informed that the remainder of the evaluation will be completed by another provider, this initial triage assessment does not replace that evaluation, and the importance of remaining in the ED until their evaluation is complete.     Carlisle Cater, PA-C 08/04/20 2202    Davonna Belling, MD 08/04/20 2347

## 2020-08-04 NOTE — ED Triage Notes (Signed)
[place] via [place], friend reports a witness seizure lasting approx 10 min, EMS states on arrival pt was GCS 15. Hx seizures. Pt reports n/v/d x 2-3 days. EMS gave 4mg  zofran IV

## 2020-08-05 MED ORDER — ACETAMINOPHEN 325 MG PO TABS
650.0000 mg | ORAL_TABLET | Freq: Once | ORAL | Status: AC
  Administered 2020-08-05: 650 mg via ORAL
  Filled 2020-08-05: qty 2

## 2020-08-05 MED ORDER — LIDOCAINE 5 % EX PTCH
1.0000 | MEDICATED_PATCH | CUTANEOUS | Status: DC
  Administered 2020-08-05: 1 via TRANSDERMAL
  Filled 2020-08-05: qty 1

## 2020-08-05 NOTE — ED Notes (Signed)
Pt given a sandwich and a bus pass

## 2020-08-05 NOTE — ED Provider Notes (Signed)
[facility] DEPT Provider Note   CSN: [id number] Arrival date & time: 08/04/20  2119     History Chief Complaint  Patient presents with  . Seizures  . Emesis    Barry Moore is a 55 y.o. male with a history of alcohol use disorder, depression, GI bleed, homelessness who presents the emergency department by EMS with a chief complaint of seizure-like activity.  The patient states that he had 2 seizures back-to-back just prior to arrival.  EMS reported that this was witnessed by a friend who states that the episode lasted approximately 10 minutes.  He has not prescribed any antiepileptic medication.  He was given 4 mg of Zofran with EMS.  He denies any urinary or fecal incontinence, tongue biting, confusion, numbness, weakness.  He is endorsing constant aching left-sided low back pain since the episode.  No known aggravating or alleviating activity.  He denies abdominal pain, nausea, vomiting, diarrhea, chest pain, shortness of breath, dysuria, hematuria, constipation.  He endorses alcohol use today.  He is unsure of how much alcohol he consumed prior to arrival.  Last drink was just prior to arrival.  No SI, HI, or auditory visual hallucinations.  He reports that he has just started to develop a frontal headache within the last few minutes.  He has a history of GI bleeds and does not take blood thinners.  The history is provided by the patient and medical records. No language interpreter was used.       Past Medical History:  Diagnosis Date  . Alcoholic hepatitis   . Depression   . ETOH abuse   . Gastric bleed 08/2018  . Hypertension   . Pancreatitis   . Seizures ([place])   . Suicidal behavior     Patient Active Problem List   Diagnosis Date Noted  . GIB (gastrointestinal bleeding) 06/25/2020  . Suicidal ideations 05/31/2020  . Nicotine dependence, cigarettes, uncomplicated 95/62/1308  . Alcohol abuse with intoxication ([place]) 07/19/2019  .  Orthostatic syncope 07/19/2019  . Upper GI bleeding 07/19/2019  . Acute upper GI bleeding 07/02/2019  . Duodenitis   . Acute upper GI bleed 01/01/2019  . Hypokalemia 01/01/2019  . Alcohol use disorder, severe, dependence ([place]) 01/01/2019  . Alcoholic liver disease ([place]) 01/01/2019  . Acute pancreatitis 12/13/2018  . Free intraperitoneal air 12/13/2018  . Homicidal ideation   . Gastrointestinal hemorrhage with melena 08/31/2018  . Colon ulcer   . Polyp of ascending colon   . Acute GI bleeding 08/05/2018  . Transaminitis 08/05/2018  . Abdominal pain 08/05/2018  . Nausea and vomiting 08/05/2018  . Substance induced mood disorder ([place]) 07/16/2017  . Severe episode of recurrent major depressive disorder, without psychotic features ([place]) 05/15/2017  . Microcytic anemia 03/18/2017  . Blood loss anemia 02/21/2017  . Upper GI bleed 02/08/2017  . Alcohol withdrawal ([place]) 02/08/2017  . Cocaine abuse ([place]) 11/18/2016  . GI bleed 11/01/2016  . Alcohol abuse   . Major depressive disorder, recurrent severe without psychotic features ([place]) 07/06/2016  . Alcohol-induced mood disorder ([place]) 06/15/2016  . Seizure ([place]) 05/26/2016  . Tobacco abuse 05/26/2016  . Homeless 05/26/2016  . Essential hypertension 05/26/2016  . Alcohol dependence with alcohol-induced mood disorder ([place]) 02/14/2016  . Severe alcohol withdrawal without perceptual disturbances without complication ([place]) 65/78/4696  . Alcoholic hepatitis without ascites 02/14/2016  . Thrombocytopenia ([place]) 02/14/2016    Past Surgical History:  Procedure Laterality Date  . BIOPSY  08/05/2018   Procedure: BIOPSY;  Surgeon:  Mansouraty, Telford Nab., MD;  Location: [facility];  Service: Gastroenterology;;  . BIOPSY  08/07/2018   Procedure: BIOPSY;  Surgeon: Thornton Park, MD;  Location: [facility] ENDOSCOPY;  Service: Gastroenterology;;  . BIOPSY  01/02/2019   Procedure: BIOPSY;  Surgeon: Gatha Mayer, MD;  Location: [facility] ENDOSCOPY;  Service:  Endoscopy;;  . COLONOSCOPY WITH PROPOFOL N/A 08/07/2018   Procedure: COLONOSCOPY WITH PROPOFOL;  Surgeon: Thornton Park, MD;  Location: [facility];  Service: Gastroenterology;  Laterality: N/A;  . ESOPHAGOGASTRODUODENOSCOPY N/A 01/02/2019   Procedure: ESOPHAGOGASTRODUODENOSCOPY (EGD);  Surgeon: Gatha Mayer, MD;  Location: [facility];  Service: Endoscopy;  Laterality: N/A;  . ESOPHAGOGASTRODUODENOSCOPY N/A 07/03/2019   Procedure: ESOPHAGOGASTRODUODENOSCOPY (EGD);  Surgeon: Wonda Horner, MD;  Location: [facility];  Service: Endoscopy;  Laterality: N/A;  . ESOPHAGOGASTRODUODENOSCOPY N/A 06/26/2020   Procedure: ESOPHAGOGASTRODUODENOSCOPY (EGD);  Surgeon: Arta Silence, MD;  Location: [facility];  Service: Endoscopy;  Laterality: N/A;  . ESOPHAGOGASTRODUODENOSCOPY (EGD) WITH PROPOFOL N/A 11/02/2016   Procedure: ESOPHAGOGASTRODUODENOSCOPY (EGD) WITH PROPOFOL;  Surgeon: Carol Ada, MD;  Location: [facility];  Service: Endoscopy;  Laterality: N/A;  . ESOPHAGOGASTRODUODENOSCOPY (EGD) WITH PROPOFOL N/A 08/05/2018   Procedure: ESOPHAGOGASTRODUODENOSCOPY (EGD) WITH PROPOFOL;  Surgeon: Rush Landmark Telford Nab., MD;  Location: [place] [facility];  Service: Gastroenterology;  Laterality: N/A;  . ESOPHAGOGASTRODUODENOSCOPY (EGD) WITH PROPOFOL N/A 07/14/2019   Procedure: ESOPHAGOGASTRODUODENOSCOPY (EGD) WITH PROPOFOL;  Surgeon: Otis Brace, MD;  Location: [facility]OS[facility];  Service: Gastroenterology;  Laterality: N/A;  . HERNIA REPAIR    . LEG SURGERY    . POLYPECTOMY  08/07/2018   Procedure: POLYPECTOMY;  Surgeon: Thornton Park, MD;  Location: [facility] ENDOSCOPY;  Service: Gastroenterology;;       Family History  Problem Relation Age of Onset  . Diabetes Mother   . Alcoholism Mother   . Emphysema Father   . Lung cancer Father   . Alcoholism Father     Social History   Tobacco Use  . Smoking status: Current Every Day Smoker    Packs/day: 1.00    Types: Cigarettes  . Smokeless tobacco:  Never Used  Vaping Use  . Vaping Use: Never used  Substance Use Topics  . Alcohol use: Yes    Comment: 5+ BEERS DAILY, Patient does not know how much he drank tonight  . Drug use: Not Currently    Frequency: 3.0 times per week    Types: Cocaine    Home Medications Prior to Admission medications   Medication Sig Start Date End Date Taking? Authorizing Provider  amLODipine (NORVASC) 5 MG tablet Take 1 tablet (5 mg total) by mouth daily. 06/27/20 06/27/21  Charlynne Cousins, MD  pantoprazole (PROTONIX) 40 MG tablet Take 1 tablet (40 mg total) by mouth 2 (two) times daily before a meal. 06/27/20   Charlynne Cousins, MD  ferrous sulfate 325 (65 FE) MG tablet Take 1 tablet (325 mg total) by mouth daily with breakfast. Patient not taking: Reported on 09/23/2018 08/09/18 10/09/18  Elodia Florence., MD    Allergies    Tomato, Aspirin, and Sulfa antibiotics  Review of Systems   Review of Systems  Constitutional: Negative for appetite change, chills, fatigue and fever.  HENT: Negative for congestion and sore throat.   Respiratory: Negative for shortness of breath.   Cardiovascular: Negative for chest pain.  Gastrointestinal: Negative for abdominal pain, constipation, diarrhea, nausea and vomiting.  Genitourinary: Negative for dysuria, frequency, hematuria, penile discharge, penile pain, penile swelling, scrotal swelling and urgency.  Musculoskeletal: Positive for  back pain. Negative for myalgias, neck pain and neck stiffness.  Skin: Negative for rash and wound.  Allergic/Immunologic: Negative for immunocompromised state.  Neurological: Positive for seizures. Negative for syncope, weakness and headaches.  Psychiatric/Behavioral: Negative for confusion.    Physical Exam Updated Vital Signs BP 110/61   Pulse 83   Temp 98.9 F (37.2 C) (Oral)   Resp 16   Ht 5\' 7"  (1.702 m)   Wt 77.5 kg   SpO2 96%   BMI 26.76 kg/m   Physical Exam Vitals and nursing note reviewed.   Constitutional:      General: He is not in acute distress.    Appearance: He is well-developed. He is not ill-appearing, toxic-appearing or diaphoretic.  HENT:     Head: Normocephalic.     Mouth/Throat:     Mouth: Mucous membranes are moist.  Eyes:     Conjunctiva/sclera: Conjunctivae normal.  Cardiovascular:     Rate and Rhythm: Normal rate and regular rhythm.     Heart sounds: No murmur heard.   Pulmonary:     Effort: Pulmonary effort is normal. No respiratory distress.     Breath sounds: No stridor. No wheezing, rhonchi or rales.  Chest:     Chest wall: No tenderness.  Abdominal:     General: There is no distension.     Palpations: Abdomen is soft. There is no mass.     Tenderness: There is no abdominal tenderness. There is no right CVA tenderness, left CVA tenderness, guarding or rebound.     Hernia: No hernia is present.     Comments: Abdomen is soft, nontender, nondistended.  No CVA tenderness bilaterally.  Musculoskeletal:     Cervical back: Neck supple.     Comments: Spine is nontender without crepitus or step-offs.  No overlying ecchymosis, wounds, rashes noted to the skin of the back.  Reproducible tenderness palpation to the left lumbar myofascial region.  No right-sided tenderness.  Skin:    General: Skin is warm and dry.     Comments: No pallor  Neurological:     Mental Status: He is alert.     Comments: Alert and oriented x3.  Follows simple commands.  Answers questions in complete, fluent sentences without flow slurred speech.  Moves all 4 extremities.  Sensation is intact.  Cranial nerves II through XII are grossly intact.  Gait is not ataxic.  Psychiatric:        Behavior: Behavior normal.     ED Results / Procedures / Treatments   Labs (all labs ordered are listed, but only abnormal results are displayed) Labs Reviewed  CBC - Abnormal; Notable for the following components:      Result Value   RBC 4.06 (*)    Hemoglobin 8.8 (*)    HCT 29.7 (*)    MCV  73.2 (*)    MCH 21.7 (*)    MCHC 29.6 (*)    RDW 18.9 (*)    Platelets 145 (*)    All other components within normal limits  BASIC METABOLIC PANEL - Abnormal; Notable for the following components:   Glucose, Bld 100 (*)    Calcium 8.8 (*)    All other components within normal limits  ETHANOL - Abnormal; Notable for the following components:   Alcohol, Ethyl (B) 285 (*)    All other components within normal limits    EKG None  Radiology DG Lumbar Spine Complete  Result Date: 08/04/2020 CLINICAL DATA:  Low back pain following  seizure activity, initial encounter EXAM: LUMBAR SPINE - COMPLETE 4+ VIEW COMPARISON:  06/15/2019 FINDINGS: Five lumbar type vertebral bodies are well visualized. Vertebral body height is well maintained. No pars defects are noted. No anterolisthesis is seen. Mild disc space narrowing is noted at L4-5 and L5-S1. IMPRESSION: Degenerative change of the lumbar spine without acute abnormality. Electronically Signed   By: Inez Catalina M.D.   On: 08/04/2020 22:29    Procedures Procedures   Medications Ordered in ED Medications  lidocaine (LIDODERM) 5 % 1 patch (1 patch Transdermal Patch Applied 08/05/20 0404)  acetaminophen (TYLENOL) tablet 650 mg (650 mg Oral Given 08/05/20 0403)    ED Course  I have reviewed the triage vital signs and the nursing notes.  Pertinent labs & imaging results that were available during my care of the patient were reviewed by me and considered in my medical decision making (see chart for details).    MDM Rules/Calculators/A&P                          55 year old male with a history of alcohol use disorder, depression, GI bleed, homelessness who presents the emergency department with a chief complaint of seizure-like activity.  EMS reported the patient had a witnessed seizure that lasted for approximately 10 minutes by a friend.  No seizure-like activity in route and he was not postictal.  Patient's only complaint on my evaluation is  some left-sided low back pain and a new headache.  Patient endorsed nausea and vomiting over the last few days to EMS, but denied this complaint on my evaluation.  Vital signs are stable.  I am familiar with this patient as he has had 6 visits to the emergency department over the last 6 months, and I personally cared for him multiple times.    Labs and imaging have been reviewed and independently interpreted by me.  No metabolic derangements.  Hemoglobin is stable and improved from previous at 8.8.  Ethanol level is 285.  He has been observed for almost 7 hours with no recurrent episodes of seizure-like activity or vomiting. Tolerating fluids by mouth. Previously, he has never been on antiepileptic medication.  I doubt alcohol withdrawal seizure as patient was acutely intoxicated on arrival to the emergency department.  An x-ray of his lumbar spine was obtained and was unremarkable for acute findings.  He has no red flags for back pain.  He has reproducible tenderness to palpation on exam and suspect musculoskeletal etiology.  Doubt UTI, pyelonephritis, L-spine fracture, cauda equina.  Doubt recurrent GI bleed.  Will give 1 dose of Tylenol and lidocaine patch for headache and back pain.  Patient is clinically sober at this time.  He has no evidence of alcohol withdrawal.  Will discharge with outpatient follow-up with Bevely Palmer [facility] at the [facility].  Patient is agreeable with this plan at this time.  ER return precautions given.  Safe for discharge with outpatient follow-up as discussed.  Final Clinical Impression(s) / ED Diagnoses Final diagnoses:  Acute alcoholic intoxication without complication ([place])  Acute left-sided low back pain without sciatica  Seizure-like activity [facility])    Rx / DC Orders ED Discharge Orders    None       Joanne Gavel, PA-C 08/05/20 0406    Mesner, Corene Cornea, MD 08/05/20 [phone]

## 2020-08-05 NOTE — Discharge Instructions (Addendum)
Thank you for allowing me to care for you today in the Emergency Department.   You were observed for more than 6 hours and did not have any seizures in the emergency department.  Your alcohol level was elevated today.  Your blood work was otherwise reassuring.  You are given Tylenol and lidocaine patch for your back pain.  You can follow-up with Bevely Palmer at the [facility] for your back pain.  Return to the emergency department for new or worsening symptoms.

## 2020-08-07 ENCOUNTER — Emergency Department (HOSPITAL_COMMUNITY)
Admission: EM | Admit: 2020-08-07 | Discharge: 2020-08-08 | Disposition: A | Discharge status: Home / Self Care | Payer: Medicaid Other | Attending: Emergency Medicine | Admitting: Emergency Medicine

## 2020-08-07 ENCOUNTER — Encounter (HOSPITAL_COMMUNITY): Payer: Self-pay | Admitting: Emergency Medicine

## 2020-08-07 ENCOUNTER — Other Ambulatory Visit: Payer: Self-pay

## 2020-08-07 DIAGNOSIS — Y908 Blood alcohol level of 240 mg/100 ml or more: Secondary | ICD-10-CM | POA: Insufficient documentation

## 2020-08-07 DIAGNOSIS — F10920 Alcohol use, unspecified with intoxication, uncomplicated: Secondary | ICD-10-CM

## 2020-08-07 DIAGNOSIS — R45851 Suicidal ideations: Secondary | ICD-10-CM | POA: Insufficient documentation

## 2020-08-07 DIAGNOSIS — F10129 Alcohol abuse with intoxication, unspecified: Secondary | ICD-10-CM | POA: Insufficient documentation

## 2020-08-07 DIAGNOSIS — I10 Essential (primary) hypertension: Secondary | ICD-10-CM | POA: Insufficient documentation

## 2020-08-07 DIAGNOSIS — F17210 Nicotine dependence, cigarettes, uncomplicated: Secondary | ICD-10-CM | POA: Insufficient documentation

## 2020-08-07 NOTE — ED Notes (Addendum)
Pt belongings placed in 13-18 nursing station cabinet. Pt has one belonging bag with him.

## 2020-08-07 NOTE — ED Triage Notes (Signed)
Pt homeless brought into ED for suicidal ideation with plan to cut self with knife. Pt heavily intoxicated and has beer and knife in his personal bag

## 2020-08-07 NOTE — ED Notes (Signed)
Pt's knife was given to security. Pt's camouflage book bag was placed in nurses station cabinet labeled "patient belongings 16-18 Resus A".

## 2020-08-08 LAB — CBC
HCT: 27.5 % — ABNORMAL LOW (ref 39.0–52.0)
Hemoglobin: 8.2 g/dL — ABNORMAL LOW (ref 13.0–17.0)
MCH: 21.8 pg — ABNORMAL LOW (ref 26.0–34.0)
MCHC: 29.8 g/dL — ABNORMAL LOW (ref 30.0–36.0)
MCV: 73.1 fL — ABNORMAL LOW (ref 80.0–100.0)
Platelets: 129 10*3/uL — ABNORMAL LOW (ref 150–400)
RBC: 3.76 MIL/uL — ABNORMAL LOW (ref 4.22–5.81)
RDW: 19.3 % — ABNORMAL HIGH (ref 11.5–15.5)
WBC: 4.9 10*3/uL (ref 4.0–10.5)
nRBC: 0 % (ref 0.0–0.2)

## 2020-08-08 LAB — RAPID URINE DRUG SCREEN, HOSP PERFORMED
Amphetamines: NOT DETECTED
Barbiturates: NOT DETECTED
Benzodiazepines: NOT DETECTED
Cocaine: NOT DETECTED
Opiates: NOT DETECTED
Tetrahydrocannabinol: POSITIVE — AB

## 2020-08-08 LAB — SALICYLATE LEVEL: Salicylate Lvl: <7 mg/dL — ABNORMAL LOW (ref 7.0–30.0)

## 2020-08-08 LAB — COMPREHENSIVE METABOLIC PANEL
ALT: 71 U/L — ABNORMAL HIGH (ref 0–44)
AST: 171 U/L — ABNORMAL HIGH (ref 15–41)
Albumin: 3.9 g/dL (ref 3.5–5.0)
Alkaline Phosphatase: 156 U/L — ABNORMAL HIGH (ref 38–126)
Anion gap: 8 (ref 5–15)
BUN: 8 mg/dL (ref 6–20)
CO2: 25 mmol/L (ref 22–32)
Calcium: 9.2 mg/dL (ref 8.9–10.3)
Chloride: 105 mmol/L (ref 98–111)
Creatinine, Ser: 0.7 mg/dL (ref 0.61–1.24)
GFR, Estimated: >60 mL/min (ref >60)
Glucose, Bld: 101 mg/dL — ABNORMAL HIGH (ref 70–99)
Potassium: 3.7 mmol/L (ref 3.5–5.1)
Sodium: 138 mmol/L (ref 135–145)
Total Bilirubin: 0.5 mg/dL (ref 0.3–1.2)
Total Protein: 7.9 g/dL (ref 6.5–8.1)

## 2020-08-08 LAB — ACETAMINOPHEN LEVEL: Acetaminophen (Tylenol), Serum: <10 ug/mL — ABNORMAL LOW (ref 10–30)

## 2020-08-08 LAB — ETHANOL: Alcohol, Ethyl (B): 371 mg/dL (ref <10)

## 2020-08-08 NOTE — ED Notes (Signed)
Discharge instructions reviewed with pt.

## 2020-08-08 NOTE — Discharge Instructions (Addendum)
1.  Follow-up behavioral health urgent care if you have feelings of ongoing suicidality or depression.  Use the resource guide attached your discharge instructions to find treatment for alcohol abuse and counseling for depression and suicidal thoughts.

## 2020-08-08 NOTE — ED Provider Notes (Signed)
Patient presented intoxicated yesterday evening.  Patient is a chronic alcoholic with recurrent episodes of intoxication.  At that time he reported suicidal thoughts.  Plan was for a TTS consult.  This morning, patient reports he feels a lot better.  He reports he thinks it was just "a drunk thing".  He reports he is definitely not suicidal and would like to be discharged from the emergency department now. Physical Exam  BP 128/84   Pulse 86   Temp 98.7 F (37.1 C) (Oral)   Resp 16   SpO2 97%   Physical Exam Patient is alert.  He is chatting with his sitter.  No signs of distress.  Status is clear.  Speech is clear.  ED Course/Procedures     Procedures  MDM  Patient discharge alert and in good spirits.  No signs of distress.  He advises he no longer feels suicidal and attributes it to his binge drinking.  Resource guide and instructions are included in discharge for seeking further outpatient treatment.       Charlesetta Shanks, MD 08/08/20 [phone]

## 2020-08-08 NOTE — ED Notes (Signed)
Pt requesting something to eat. Pt given saltine crackers and a ginger ale.

## 2020-08-08 NOTE — ED Notes (Signed)
Pt denies SI and HI currently.

## 2020-08-08 NOTE — ED Provider Notes (Signed)
[facility] Emergency Department Provider Note MRN:  [id number]  Arrival date & time: 08/08/20     Chief Complaint   Suicidal   History of Present Illness   Barry Moore is a 55 y.o. year-old male with a history of alcohol abuse presenting to the ED with chief complaint of suicide.  Patient has been drinking this evening, endorsing suicidality, stating he has nothing to live for, states he has nowhere else to go, nowhere else to get help.  Endorsing chronic back pain which is unchanged.  No fever, no recent injuries.  I was unable to obtain an accurate HPI, PMH, or ROS due to the patient's intoxication.  Level 5 caveat.  Review of Systems  Positive for back pain, intoxication, suicidality.  Patient's Health History    Past Medical History:  Diagnosis Date  . Alcoholic hepatitis   . Depression   . ETOH abuse   . Gastric bleed 08/2018  . Hypertension   . Pancreatitis   . Seizures ([place])   . Suicidal behavior     Past Surgical History:  Procedure Laterality Date  . BIOPSY  08/05/2018   Procedure: BIOPSY;  Surgeon: Rush Landmark Telford Nab., MD;  Location: [place];  Service: Gastroenterology;;  . BIOPSY  08/07/2018   Procedure: BIOPSY;  Surgeon: Thornton Park, MD;  Location: [facility] ENDOSCOPY;  Service: Gastroenterology;;  . BIOPSY  01/02/2019   Procedure: BIOPSY;  Surgeon: Gatha Mayer, MD;  Location: [facility] ENDOSCOPY;  Service: Endoscopy;;  . COLONOSCOPY WITH PROPOFOL N/A 08/07/2018   Procedure: COLONOSCOPY WITH PROPOFOL;  Surgeon: Thornton Park, MD;  Location: [place];  Service: Gastroenterology;  Laterality: N/A;  . ESOPHAGOGASTRODUODENOSCOPY N/A 01/02/2019   Procedure: ESOPHAGOGASTRODUODENOSCOPY (EGD);  Surgeon: Gatha Mayer, MD;  Location: [facility] ENDOSCOPY;  Service: Endoscopy;  Laterality: N/A;  . ESOPHAGOGASTRODUODENOSCOPY N/A 07/03/2019   Procedure: ESOPHAGOGASTRODUODENOSCOPY (EGD);  Surgeon: Wonda Horner, MD;  Location: [facility]  ENDOSCOPY;  Service: Endoscopy;  Laterality: N/A;  . ESOPHAGOGASTRODUODENOSCOPY N/A 06/26/2020   Procedure: ESOPHAGOGASTRODUODENOSCOPY (EGD);  Surgeon: Arta Silence, MD;  Location: [facility] ENDOSCOPY;  Service: Endoscopy;  Laterality: N/A;  . ESOPHAGOGASTRODUODENOSCOPY (EGD) WITH PROPOFOL N/A 11/02/2016   Procedure: ESOPHAGOGASTRODUODENOSCOPY (EGD) WITH PROPOFOL;  Surgeon: Carol Ada, MD;  Location: [facility] ENDOSCOPY;  Service: Endoscopy;  Laterality: N/A;  . ESOPHAGOGASTRODUODENOSCOPY (EGD) WITH PROPOFOL N/A 08/05/2018   Procedure: ESOPHAGOGASTRODUODENOSCOPY (EGD) WITH PROPOFOL;  Surgeon: Rush Landmark Telford Nab., MD;  Location: [place];  Service: Gastroenterology;  Laterality: N/A;  . ESOPHAGOGASTRODUODENOSCOPY (EGD) WITH PROPOFOL N/A 07/14/2019   Procedure: ESOPHAGOGASTRODUODENOSCOPY (EGD) WITH PROPOFOL;  Surgeon: Otis Brace, MD;  Location: [facility] ENDOSCOPY;  Service: Gastroenterology;  Laterality: N/A;  . HERNIA REPAIR    . LEG SURGERY    . POLYPECTOMY  08/07/2018   Procedure: POLYPECTOMY;  Surgeon: Thornton Park, MD;  Location: [place] ENDOSCOPY;  Service: Gastroenterology;;    Family History  Problem Relation Age of Onset  . Diabetes Mother   . Alcoholism Mother   . Emphysema Father   . Lung cancer Father   . Alcoholism Father     Social History   Socioeconomic History  . Marital status: Divorced    Spouse name: Not on file  . Number of children: Not on file  . Years of education: Not on file  . Highest education level: Not on file  Occupational History  . Not on file  Tobacco Use  . Smoking status: Current Every Day Smoker    Packs/day: 1.00    Types: Cigarettes  .  Smokeless tobacco: Never Used  Vaping Use  . Vaping Use: Never used  Substance and Sexual Activity  . Alcohol use: Yes    Comment: 5+ BEERS DAILY, Patient does not know how much he drank tonight  . Drug use: Not Currently    Frequency: 3.0 times per week    Types: Cocaine  . Sexual activity: Not Currently   Other Topics Concern  . Not on file  Social History Narrative  . Not on file   Social Determinants of Health   Financial Resource Strain: Not on file  Food Insecurity: Not on file  Transportation Needs: Not on file  Physical Activity: Not on file  Stress: Not on file  Social Connections: Not on file  Intimate Partner Violence: Not on file     Physical Exam   Vitals:   08/08/20 0335 08/08/20 0500  BP: 119/62 128/84  Pulse: 81 86  Resp: 16 16  Temp:    SpO2: 97% 97%    CONSTITUTIONAL: Chronically ill-appearing, NAD NEURO:  Alert and oriented x 3, no focal deficits, mildly inebriated EYES:  eyes equal and reactive ENT/NECK:  no LAD, no JVD CARDIO: Regular rate, well-perfused, normal S1 and S2 PULM:  CTAB no wheezing or rhonchi GI/GU:  normal bowel sounds, non-distended, non-tender MSK/SPINE:  No gross deformities, no edema SKIN:  no rash, atraumatic PSYCH:  Appropriate speech and behavior  *Additional and/or pertinent findings included in MDM below  Diagnostic and Interventional Summary    EKG Interpretation  Date/Time:    Ventricular Rate:    PR Interval:    QRS Duration:   QT Interval:    QTC Calculation:   R Axis:     Text Interpretation:        Labs Reviewed  COMPREHENSIVE METABOLIC PANEL - Abnormal; Notable for the following components:      Result Value   Glucose, Bld 101 (*)    AST 171 (*)    ALT 71 (*)    Alkaline Phosphatase 156 (*)    All other components within normal limits  ETHANOL - Abnormal; Notable for the following components:   Alcohol, Ethyl (B) 371 (*)    All other components within normal limits  SALICYLATE LEVEL - Abnormal; Notable for the following components:   Salicylate Lvl <1.6 (*)    All other components within normal limits  ACETAMINOPHEN LEVEL - Abnormal; Notable for the following components:   Acetaminophen (Tylenol), Serum <10 (*)    All other components within normal limits  CBC - Abnormal; Notable for the  following components:   RBC 3.76 (*)    Hemoglobin 8.2 (*)    HCT 27.5 (*)    MCV 73.1 (*)    MCH 21.8 (*)    MCHC 29.8 (*)    RDW 19.3 (*)    Platelets 129 (*)    All other components within normal limits  RAPID URINE DRUG SCREEN, HOSP PERFORMED    No orders to display    Medications - No data to display   Procedures  /  Critical Care Procedures  ED Course and Medical Decision Making  I have reviewed the triage vital signs, the nursing notes, and pertinent available records from the EMR.  Listed above are laboratory and imaging tests that I personally ordered, reviewed, and interpreted and then considered in my medical decision making (see below for details).  Alcohol intoxication with suicidality, labs normal, nontraumatic exam, will reassess in a few hours to see if TTS  evaluation is indicated.     On reassessment patient is doing better, he currently denies SI, explaining that he was struggling in the setting of alcohol intoxication.  He does elaborate that he had his knife out and was about to take his life but instead he called.  We discussed options and we agreed that TTS evaluation would be beneficial.  Awaiting recommendations.  Signed out to default provider.  Medically cleared.  Barth Kirks. [place] Small, [place] [email or web address]  Final Clinical Impressions(s) / ED Diagnoses     ICD-10-CM   1. Suicidal ideation  R45.851   2. Alcoholic intoxication without complication ([place])  K09.381     ED Discharge Orders    None       Discharge Instructions Discussed with and Provided to Patient:   Discharge Instructions   None       [name], MD 08/08/20 [phone]

## 2020-08-08 NOTE — ED Notes (Signed)
Belongings returned to pt. Security informed to give knife back to pt.

## 2020-08-08 NOTE — ED Notes (Signed)
Pt changed into purple scrubs. 2 pt belonging bags placed in cabinets 16-18.

## 2020-08-08 NOTE — ED Notes (Signed)
Critical Result Time: 0034 Test:  Alcohol  Result: 371 Action: Awaiting orders

## 2020-08-08 NOTE — ED Notes (Signed)
Pt ambulatory to restroom

## 2020-08-11 ENCOUNTER — Other Ambulatory Visit: Payer: Self-pay

## 2020-08-11 ENCOUNTER — Emergency Department (HOSPITAL_COMMUNITY)
Admission: EM | Admit: 2020-08-11 | Discharge: 2020-08-13 | Disposition: A | Discharge status: Home / Self Care | Payer: Medicaid Other | Attending: Emergency Medicine | Admitting: Emergency Medicine

## 2020-08-11 ENCOUNTER — Encounter (HOSPITAL_COMMUNITY): Payer: Self-pay

## 2020-08-11 DIAGNOSIS — F10920 Alcohol use, unspecified with intoxication, uncomplicated: Secondary | ICD-10-CM

## 2020-08-11 DIAGNOSIS — Z20822 Contact with and (suspected) exposure to covid-19: Secondary | ICD-10-CM | POA: Insufficient documentation

## 2020-08-11 DIAGNOSIS — F1024 Alcohol dependence with alcohol-induced mood disorder: Secondary | ICD-10-CM | POA: Insufficient documentation

## 2020-08-11 DIAGNOSIS — Y908 Blood alcohol level of 240 mg/100 ml or more: Secondary | ICD-10-CM | POA: Insufficient documentation

## 2020-08-11 DIAGNOSIS — R45851 Suicidal ideations: Secondary | ICD-10-CM | POA: Insufficient documentation

## 2020-08-11 DIAGNOSIS — I10 Essential (primary) hypertension: Secondary | ICD-10-CM | POA: Insufficient documentation

## 2020-08-11 DIAGNOSIS — F17210 Nicotine dependence, cigarettes, uncomplicated: Secondary | ICD-10-CM | POA: Insufficient documentation

## 2020-08-11 LAB — COMPREHENSIVE METABOLIC PANEL
ALT: 79 U/L — ABNORMAL HIGH (ref 0–44)
AST: 226 U/L — ABNORMAL HIGH (ref 15–41)
Albumin: 4.3 g/dL (ref 3.5–5.0)
Alkaline Phosphatase: 138 U/L — ABNORMAL HIGH (ref 38–126)
Anion gap: 8 (ref 5–15)
BUN: 7 mg/dL (ref 6–20)
CO2: 26 mmol/L (ref 22–32)
Calcium: 9 mg/dL (ref 8.9–10.3)
Chloride: 104 mmol/L (ref 98–111)
Creatinine, Ser: 0.87 mg/dL (ref 0.61–1.24)
GFR, Estimated: >60 mL/min (ref >60)
Glucose, Bld: 101 mg/dL — ABNORMAL HIGH (ref 70–99)
Potassium: 3.8 mmol/L (ref 3.5–5.1)
Sodium: 138 mmol/L (ref 135–145)
Total Bilirubin: 0.6 mg/dL (ref 0.3–1.2)
Total Protein: 8.8 g/dL — ABNORMAL HIGH (ref 6.5–8.1)

## 2020-08-11 LAB — CBC WITH DIFFERENTIAL/PLATELET
Abs Immature Granulocytes: 0.02 10*3/uL (ref 0.00–0.07)
Basophils Absolute: 0.1 10*3/uL (ref 0.0–0.1)
Basophils Relative: 1 %
Eosinophils Absolute: 0.2 10*3/uL (ref 0.0–0.5)
Eosinophils Relative: 4 %
HCT: 31.6 % — ABNORMAL LOW (ref 39.0–52.0)
Hemoglobin: 9.2 g/dL — ABNORMAL LOW (ref 13.0–17.0)
Immature Granulocytes: 0 %
Lymphocytes Relative: 45 %
Lymphs Abs: 2.4 10*3/uL (ref 0.7–4.0)
MCH: 21.5 pg — ABNORMAL LOW (ref 26.0–34.0)
MCHC: 29.1 g/dL — ABNORMAL LOW (ref 30.0–36.0)
MCV: 74 fL — ABNORMAL LOW (ref 80.0–100.0)
Monocytes Absolute: 0.8 10*3/uL (ref 0.1–1.0)
Monocytes Relative: 14 %
Neutro Abs: 1.9 10*3/uL (ref 1.7–7.7)
Neutrophils Relative %: 36 %
Platelets: 122 10*3/uL — ABNORMAL LOW (ref 150–400)
RBC: 4.27 MIL/uL (ref 4.22–5.81)
RDW: 19.5 % — ABNORMAL HIGH (ref 11.5–15.5)
WBC: 5.4 10*3/uL (ref 4.0–10.5)
nRBC: 0 % (ref 0.0–0.2)

## 2020-08-11 LAB — RESP PANEL BY RT-PCR (FLU A&B, COVID) ARPGX2
Influenza A by PCR: NEGATIVE
Influenza B by PCR: NEGATIVE
SARS Coronavirus 2 by RT PCR: NEGATIVE

## 2020-08-11 LAB — ACETAMINOPHEN LEVEL: Acetaminophen (Tylenol), Serum: <10 ug/mL — ABNORMAL LOW (ref 10–30)

## 2020-08-11 LAB — ETHANOL: Alcohol, Ethyl (B): 382 mg/dL (ref <10)

## 2020-08-11 LAB — SALICYLATE LEVEL: Salicylate Lvl: <7 mg/dL — ABNORMAL LOW (ref 7.0–30.0)

## 2020-08-11 MED ORDER — LORAZEPAM 1 MG PO TABS
0.0000 mg | ORAL_TABLET | Freq: Four times a day (QID) | ORAL | Status: DC
  Administered 2020-08-12: 1 mg via ORAL
  Administered 2020-08-12: 2 mg via ORAL
  Administered 2020-08-12: 1 mg via ORAL
  Filled 2020-08-11: qty 1
  Filled 2020-08-11 (×2): qty 2

## 2020-08-11 MED ORDER — THIAMINE HCL 100 MG/ML IJ SOLN
100.0000 mg | Freq: Every day | INTRAMUSCULAR | Status: DC
  Administered 2020-08-11: 100 mg via INTRAVENOUS
  Filled 2020-08-11: qty 2

## 2020-08-11 MED ORDER — LORAZEPAM 2 MG/ML IJ SOLN: 0.0000 mg | Freq: Two times a day (BID) | INTRAMUSCULAR | Status: DC

## 2020-08-11 MED ORDER — THIAMINE HCL 100 MG/ML IJ SOLN: 100.0000 mg | Freq: Once | INTRAMUSCULAR | Status: DC

## 2020-08-11 MED ORDER — LORAZEPAM 2 MG/ML IJ SOLN
0.0000 mg | Freq: Four times a day (QID) | INTRAMUSCULAR | Status: DC
Start: 2020-08-11 — End: 2020-08-13
  Administered 2020-08-11: 1 mg via INTRAVENOUS
  Filled 2020-08-11: qty 1

## 2020-08-11 MED ORDER — LORAZEPAM 1 MG PO TABS: 0.0000 mg | ORAL_TABLET | Freq: Two times a day (BID) | ORAL | Status: DC

## 2020-08-11 MED ORDER — PANTOPRAZOLE SODIUM 40 MG PO TBEC
40.0000 mg | DELAYED_RELEASE_TABLET | Freq: Two times a day (BID) | ORAL | Status: DC
  Administered 2020-08-12 (×2): 40 mg via ORAL
  Filled 2020-08-11 (×2): qty 1

## 2020-08-11 MED ORDER — AMLODIPINE BESYLATE 5 MG PO TABS
5.0000 mg | ORAL_TABLET | Freq: Every day | ORAL | Status: DC
  Administered 2020-08-11 – 2020-08-12 (×2): 5 mg via ORAL
  Filled 2020-08-11 (×2): qty 1

## 2020-08-11 MED ORDER — THIAMINE HCL 100 MG PO TABS
100.0000 mg | ORAL_TABLET | Freq: Every day | ORAL | Status: DC
  Administered 2020-08-12: 100 mg via ORAL
  Filled 2020-08-11: qty 1

## 2020-08-11 NOTE — ED Notes (Signed)
Patient's belongings (3 bags) placed in Dayroom in [facility].

## 2020-08-11 NOTE — ED Provider Notes (Addendum)
[facility] DEPT Provider Note   CSN: [id number] Arrival date & time: 08/11/20  1825     History Chief Complaint  Patient presents with  . Alcohol Intoxication  . Seizures  . Suicidal    Barry Moore is a 55 y.o. male with PMH/o ETOH abuse, HTN, pancreatitis, seizures BIB EMS for evaluation of alcohol intoxication.  Patient reports that he has been drinking alcohol.  He states he drink beer earlier today but does not know how much.  He states he had a seizure earlier today.  He called EMS and told them that he was suicidal.  Patient states that he has had worsening depression recently.  He does not elaborate as to what is causing it.  He states he carries a knife with him all the time and states that he has thought about slitting his wrist.  Denies any cocaine, heroin, marijuana use.  No abdominal pain, numbness/weakness.  EM LEVEL 5 CAVEAT DUE TO ALCOHOL INTOXICATION  The history is provided by the patient.       Past Medical History:  Diagnosis Date  . Alcoholic hepatitis   . Depression   . ETOH abuse   . Gastric bleed 08/2018  . Hypertension   . Pancreatitis   . Seizures ([place])   . Suicidal behavior     Patient Active Problem List   Diagnosis Date Noted  . GIB (gastrointestinal bleeding) 06/25/2020  . Suicidal ideations 05/31/2020  . Nicotine dependence, cigarettes, uncomplicated 78/29/5621  . Alcohol abuse with intoxication ([place]) 07/19/2019  . Orthostatic syncope 07/19/2019  . Upper GI bleeding 07/19/2019  . Acute upper GI bleeding 07/02/2019  . Duodenitis   . Acute upper GI bleed 01/01/2019  . Hypokalemia 01/01/2019  . Alcohol use disorder, severe, dependence ([place]) 01/01/2019  . Alcoholic liver disease ([place]) 01/01/2019  . Acute pancreatitis 12/13/2018  . Free intraperitoneal air 12/13/2018  . Homicidal ideation   . Gastrointestinal hemorrhage with melena 08/31/2018  . Colon ulcer   . Polyp of ascending colon   . Acute GI  bleeding 08/05/2018  . Transaminitis 08/05/2018  . Abdominal pain 08/05/2018  . Nausea and vomiting 08/05/2018  . Substance induced mood disorder ([place]) 07/16/2017  . Severe episode of recurrent major depressive disorder, without psychotic features ([place]) 05/15/2017  . Microcytic anemia 03/18/2017  . Blood loss anemia 02/21/2017  . Upper GI bleed 02/08/2017  . Alcohol withdrawal ([place]) 02/08/2017  . Cocaine abuse ([place]) 11/18/2016  . GI bleed 11/01/2016  . Alcohol abuse   . Major depressive disorder, recurrent severe without psychotic features ([place]) 07/06/2016  . Alcohol-induced mood disorder ([place]) 06/15/2016  . Seizure ([place]) 05/26/2016  . Tobacco abuse 05/26/2016  . Homeless 05/26/2016  . Essential hypertension 05/26/2016  . Alcohol dependence with alcohol-induced mood disorder ([place]) 02/14/2016  . Severe alcohol withdrawal without perceptual disturbances without complication ([place]) 30/86/5784  . Alcoholic hepatitis without ascites 02/14/2016  . Thrombocytopenia ([place]) 02/14/2016    Past Surgical History:  Procedure Laterality Date  . BIOPSY  08/05/2018   Procedure: BIOPSY;  Surgeon: Rush Landmark Telford Nab., MD;  Location: [place];  Service: Gastroenterology;;  . BIOPSY  08/07/2018   Procedure: BIOPSY;  Surgeon: Thornton Park, MD;  Location: [facility] ENDOSCOPY;  Service: Gastroenterology;;  . BIOPSY  01/02/2019   Procedure: BIOPSY;  Surgeon: Gatha Mayer, MD;  Location: [facility] ENDOSCOPY;  Service: Endoscopy;;  . COLONOSCOPY WITH PROPOFOL N/A 08/07/2018   Procedure: COLONOSCOPY WITH PROPOFOL;  Surgeon: Thornton Park, MD;  Location: [facility]  ENDOSCOPY;  Service: Gastroenterology;  Laterality: N/A;  . ESOPHAGOGASTRODUODENOSCOPY N/A 01/02/2019   Procedure: ESOPHAGOGASTRODUODENOSCOPY (EGD);  Surgeon: Gatha Mayer, MD;  Location: [facility] ENDOSCOPY;  Service: Endoscopy;  Laterality: N/A;  . ESOPHAGOGASTRODUODENOSCOPY N/A 07/03/2019   Procedure: ESOPHAGOGASTRODUODENOSCOPY (EGD);  Surgeon: Wonda Horner, MD;  Location: [facility] ENDOSCOPY;  Service: Endoscopy;  Laterality: N/A;  . ESOPHAGOGASTRODUODENOSCOPY N/A 06/26/2020   Procedure: ESOPHAGOGASTRODUODENOSCOPY (EGD);  Surgeon: Arta Silence, MD;  Location: [facility] ENDOSCOPY;  Service: Endoscopy;  Laterality: N/A;  . ESOPHAGOGASTRODUODENOSCOPY (EGD) WITH PROPOFOL N/A 11/02/2016   Procedure: ESOPHAGOGASTRODUODENOSCOPY (EGD) WITH PROPOFOL;  Surgeon: Carol Ada, MD;  Location: [facility] ENDOSCOPY;  Service: Endoscopy;  Laterality: N/A;  . ESOPHAGOGASTRODUODENOSCOPY (EGD) WITH PROPOFOL N/A 08/05/2018   Procedure: ESOPHAGOGASTRODUODENOSCOPY (EGD) WITH PROPOFOL;  Surgeon: Rush Landmark Telford Nab., MD;  Location: [place];  Service: Gastroenterology;  Laterality: N/A;  . ESOPHAGOGASTRODUODENOSCOPY (EGD) WITH PROPOFOL N/A 07/14/2019   Procedure: ESOPHAGOGASTRODUODENOSCOPY (EGD) WITH PROPOFOL;  Surgeon: Otis Brace, MD;  Location: [facility] ENDOSCOPY;  Service: Gastroenterology;  Laterality: N/A;  . HERNIA REPAIR    . LEG SURGERY    . POLYPECTOMY  08/07/2018   Procedure: POLYPECTOMY;  Surgeon: Thornton Park, MD;  Location: [facility] ENDOSCOPY;  Service: Gastroenterology;;       Family History  Problem Relation Age of Onset  . Diabetes Mother   . Alcoholism Mother   . Emphysema Father   . Lung cancer Father   . Alcoholism Father     Social History   Tobacco Use  . Smoking status: Current Every Day Smoker    Packs/day: 1.00    Types: Cigarettes  . Smokeless tobacco: Never Used  Vaping Use  . Vaping Use: Never used  Substance Use Topics  . Alcohol use: Yes    Comment: 5+ BEERS DAILY, Patient does not know how much he drank tonight  . Drug use: Not Currently    Frequency: 3.0 times per week    Types: Cocaine    Home Medications Prior to Admission medications   Medication Sig Start Date End Date Taking? Authorizing Provider  amLODipine (NORVASC) 5 MG tablet Take 1 tablet (5 mg total) by mouth daily. 06/27/20 06/27/21  Charlynne Cousins, MD   pantoprazole (PROTONIX) 40 MG tablet Take 1 tablet (40 mg total) by mouth 2 (two) times daily before a meal. 06/27/20   Charlynne Cousins, MD  ferrous sulfate 325 (65 FE) MG tablet Take 1 tablet (325 mg total) by mouth daily with breakfast. Patient not taking: Reported on 09/23/2018 08/09/18 10/09/18  Elodia Florence., MD    Allergies    Tomato, Aspirin, and Sulfa antibiotics  Review of Systems   Review of Systems  Unable to perform ROS: Other    Physical Exam Updated Vital Signs BP (!) 158/85   Pulse 79   Temp 97.9 F (36.6 C) (Oral)   Resp 14   Ht 5' 7"  (1.702 m)   Wt 77.5 kg   SpO2 99%   BMI 26.76 kg/m   Physical Exam Vitals and nursing note reviewed.  Constitutional:      Appearance: Normal appearance. He is well-developed.     Comments: Appears intoxicated.   HENT:     Head: Normocephalic and atraumatic.     Mouth/Throat:     Comments: No intraoral trauma Eyes:     General: Lids are normal.     Conjunctiva/sclera: Conjunctivae normal.     Pupils: Pupils are equal, round, and reactive to light.  Cardiovascular:  Rate and Rhythm: Normal rate and regular rhythm.     Pulses: Normal pulses.          Radial pulses are 2+ on the right side and 2+ on the left side.     Heart sounds: Normal heart sounds. No murmur heard. No friction rub. No gallop.   Pulmonary:     Effort: Pulmonary effort is normal.     Breath sounds: Normal breath sounds.     Comments: Lungs clear to auscultation bilaterally.  Symmetric chest rise.  No wheezing, rales, rhonchi. Abdominal:     Palpations: Abdomen is soft. Abdomen is not rigid.     Tenderness: There is no abdominal tenderness. There is no guarding.     Comments: Abdomen is soft, non-distended, non-tender. No rigidity, No guarding. No peritoneal signs.  Musculoskeletal:        General: Normal range of motion.     Cervical back: Full passive range of motion without pain.  Skin:    General: Skin is warm and dry.      Capillary Refill: Capillary refill takes less than 2 seconds.  Neurological:     Mental Status: He is alert and oriented to person, place, and time.     Comments: Follows commands, Moves all extremities  5/5 strength to BUE and BLE  Sensation intact throughout all major nerve distributions  Psychiatric:        Speech: Speech normal.     ED Results / Procedures / Treatments   Labs (all labs ordered are listed, but only abnormal results are displayed) Labs Reviewed  COMPREHENSIVE METABOLIC PANEL - Abnormal; Notable for the following components:      Result Value   Glucose, Bld 101 (*)    Total Protein 8.8 (*)    AST 226 (*)    ALT 79 (*)    Alkaline Phosphatase 138 (*)    All other components within normal limits  CBC WITH DIFFERENTIAL/PLATELET - Abnormal; Notable for the following components:   Hemoglobin 9.2 (*)    HCT 31.6 (*)    MCV 74.0 (*)    MCH 21.5 (*)    MCHC 29.1 (*)    RDW 19.5 (*)    Platelets 122 (*)    All other components within normal limits  ETHANOL - Abnormal; Notable for the following components:   Alcohol, Ethyl (B) 382 (*)    All other components within normal limits  ACETAMINOPHEN LEVEL - Abnormal; Notable for the following components:   Acetaminophen (Tylenol), Serum <10 (*)    All other components within normal limits  SALICYLATE LEVEL - Abnormal; Notable for the following components:   Salicylate Lvl <0.2 (*)    All other components within normal limits  RESP PANEL BY RT-PCR (FLU A&B, COVID) ARPGX2  RAPID URINE DRUG SCREEN, HOSP PERFORMED    EKG None  Radiology No results found.  Procedures Procedures   Medications Ordered in ED Medications  LORazepam (ATIVAN) injection 0-4 mg (1 mg Intravenous Given 08/11/20 1954)    Or  LORazepam (ATIVAN) tablet 0-4 mg ( Oral See Alternative 08/11/20 1954)  LORazepam (ATIVAN) injection 0-4 mg (has no administration in time range)    Or  LORazepam (ATIVAN) tablet 0-4 mg (has no administration in time  range)  thiamine tablet 100 mg ( Oral See Alternative 08/11/20 1954)    Or  thiamine (B-1) injection 100 mg (100 mg Intravenous Given 08/11/20 1954)  amLODipine (NORVASC) tablet 5 mg (has no administration in time range)  pantoprazole (PROTONIX) EC tablet 40 mg (has no administration in time range)    ED Course  I have reviewed the triage vital signs and the nursing notes.  Pertinent labs & imaging results that were available during my care of the patient were reviewed by me and considered in my medical decision making (see chart for details).    MDM Rules/Calculators/A&P                          55 y.o. M possible history of alcohol abuse, pancreatitis, seizures brought in by EMS for evaluation alcohol use.  Patient reports that he has been drinking alcohol.  He states he drink beer earlier today but is not tell me how much.  He told EMS and me that he had a seizure earlier today.  No signs of intraoral trauma, urinary or bowel incontinence.  Patient reports he is suicidal and has thought about cutting his wrist.  On initial arrival, he is afebrile, nontoxic-appearing.  He does appear intoxicated.  No signs stable.  We will plan for medical clearance labs, TTS consult.  Ethanol is elevated at 382.  Salicylate, acetaminophen level negative.  CBC shows no leukocytosis.  Hemoglobin is 9.2.  This is baseline for him.  CMP shows normal BUN and creatinine.  LFTs are elevated with AST of 226, ALT of 79, alk phos of 138.  He has had elevations in the past.  This is not new.  CIWA protocols intiiated on patient.   Patient is medically clear and pending TTS consult. He is voluntary at this time.   Reevaluation.  Behavioral health wants to wait until he is more sober.  He is resting comfortably at this time with no signs of distress.  He has not had any seizure activity here in the ED.  Patient placed in a psych hold.  Portions of this note were generated with [employer]. Dictation  errors may occur despite best attempts at proofreading.   Final Clinical Impression(s) / ED Diagnoses Final diagnoses:  Alcoholic intoxication without complication [facility])  Suicidal ideation    Rx / DC Orders ED Discharge Orders    None       Volanda Napoleon, PA-C 08/11/20 1937    Volanda Napoleon, PA-C 08/11/20 2203    Arnaldo Natal, MD 08/12/20 0021

## 2020-08-11 NOTE — ED Notes (Signed)
Per Simonne Martinet, PA pt does not require cardiac monitoring at this time.

## 2020-08-11 NOTE — ED Notes (Signed)
Pt has been wanded by [employer].

## 2020-08-11 NOTE — ED Triage Notes (Addendum)
Per EMS- Patient is intoxicated. Patient states he has had a seizure. Patient also told EMS that he is suicidal.   Patient told EDPA that he drank beer about 2 hours ago

## 2020-08-11 NOTE — ED Notes (Addendum)
Pt has 3 bags of belongings  Pt has been seen by security.      [name]

## 2020-08-11 NOTE — BH Assessment (Signed)
Pt BAL is 382 at 19:11.  TTS to assess when it is around 200.

## 2020-08-12 LAB — RAPID URINE DRUG SCREEN, HOSP PERFORMED
Amphetamines: NOT DETECTED
Barbiturates: NOT DETECTED
Benzodiazepines: POSITIVE — AB
Cocaine: NOT DETECTED
Opiates: NOT DETECTED
Tetrahydrocannabinol: NOT DETECTED
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
## 2020-08-12 NOTE — ED Notes (Signed)
Patient ambulated to bathroom with no assistance.

## 2020-08-12 NOTE — ED Notes (Signed)
Pt in bed resting, respirations even and unlabored. 

## 2020-08-12 NOTE — BH Assessment (Addendum)
Disposition: Clinician attempted to complete patient's TTS assessment at 1030. Sent nursing Dorian Pod, RN) a secure chat requesting TTS machine to be set up. Per nursing, "He is alseep and just had ativan". Nursing asked to contact TTS once patient awakens and is alert to complete his TTS assessment.

## 2020-08-13 ENCOUNTER — Emergency Department (HOSPITAL_COMMUNITY)
Admission: EM | Admit: 2020-08-13 | Discharge: 2020-08-14 | Disposition: A | Discharge status: Home / Self Care | Payer: Self-pay | Attending: Emergency Medicine | Admitting: Emergency Medicine

## 2020-08-13 DIAGNOSIS — I10 Essential (primary) hypertension: Secondary | ICD-10-CM | POA: Insufficient documentation

## 2020-08-13 DIAGNOSIS — Z23 Encounter for immunization: Secondary | ICD-10-CM | POA: Insufficient documentation

## 2020-08-13 DIAGNOSIS — R079 Chest pain, unspecified: Secondary | ICD-10-CM | POA: Insufficient documentation

## 2020-08-13 DIAGNOSIS — F17210 Nicotine dependence, cigarettes, uncomplicated: Secondary | ICD-10-CM | POA: Insufficient documentation

## 2020-08-13 DIAGNOSIS — S5011XA Contusion of right forearm, initial encounter: Secondary | ICD-10-CM | POA: Insufficient documentation

## 2020-08-13 DIAGNOSIS — M542 Cervicalgia: Secondary | ICD-10-CM | POA: Insufficient documentation

## 2020-08-13 DIAGNOSIS — R519 Headache, unspecified: Secondary | ICD-10-CM | POA: Insufficient documentation
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
## 2020-08-13 NOTE — ED Notes (Signed)
Pt now reports LOC to PA. Pt to get scans of head before he is taken to shower to get cleaned up

## 2020-08-13 NOTE — ED Notes (Signed)
Pt reporting 9/10 pain "everywhere" - Pt denies SI to this RN at this time

## 2020-08-13 NOTE — ED Provider Notes (Signed)
Emergency Medicine Observation Re-evaluation Note  Barry Moore is a 55 y.o. male, seen on rounds today.  Pt initially presented to the ED for complaints of Alcohol Intoxication, Seizures, and Suicidal Currently, the patient is watching TV and had just gotten up and gone to the bathroom.  Physical Exam  BP (!) 141/71 (BP Location: Left Arm)   Pulse 78   Temp 97.8 F (36.6 C) (Oral)   Resp 18   Ht 5\' 7"  (1.702 m)   Wt 77.5 kg   SpO2 98%   BMI 26.76 kg/m  Physical Exam General: NAD Cardiac: regular rate Lungs: clear Psych: no acute issues.  No tremors noted  ED Course / MDM  EKG:   I have reviewed the labs performed to date as well as medications administered while in observation.  Recent changes in the last 24 hours include seen yesterday by TTS at 5 PM and deemed stable for discharge however did not occur overnight..  Plan  Current plan is for d/c home with outpt resources.    Blanchie Dessert, MD 08/13/20 [phone]

## 2020-08-14 ENCOUNTER — Emergency Department (HOSPITAL_COMMUNITY): Payer: Self-pay

## 2020-08-14 ENCOUNTER — Other Ambulatory Visit (HOSPITAL_COMMUNITY): Payer: Self-pay

## 2020-08-14 LAB — CBC
HCT: 31.9 % — ABNORMAL LOW (ref 39.0–52.0)
Hemoglobin: 9.1 g/dL — ABNORMAL LOW (ref 13.0–17.0)
MCH: 21.7 pg — ABNORMAL LOW (ref 26.0–34.0)
MCHC: 28.5 g/dL — ABNORMAL LOW (ref 30.0–36.0)
MCV: 76.1 fL — ABNORMAL LOW (ref 80.0–100.0)
Platelets: 120 10*3/uL — ABNORMAL LOW (ref 150–400)
RBC: 4.19 MIL/uL — ABNORMAL LOW (ref 4.22–5.81)
RDW: 19.9 % — ABNORMAL HIGH (ref 11.5–15.5)
WBC: 4.6 10*3/uL (ref 4.0–10.5)
nRBC: 0 % (ref 0.0–0.2)

## 2020-08-14 LAB — COMPREHENSIVE METABOLIC PANEL
ALT: 84 U/L — ABNORMAL HIGH (ref 0–44)
AST: 152 U/L — ABNORMAL HIGH (ref 15–41)
Albumin: 3.9 g/dL (ref 3.5–5.0)
Alkaline Phosphatase: 114 U/L (ref 38–126)
Anion gap: 14 (ref 5–15)
BUN: 5 mg/dL — ABNORMAL LOW (ref 6–20)
CO2: 17 mmol/L — ABNORMAL LOW (ref 22–32)
Calcium: 9.1 mg/dL (ref 8.9–10.3)
Chloride: 102 mmol/L (ref 98–111)
Creatinine, Ser: 0.71 mg/dL (ref 0.61–1.24)
GFR, Estimated: >60 mL/min (ref >60)
Glucose, Bld: 106 mg/dL — ABNORMAL HIGH (ref 70–99)
Potassium: 3.2 mmol/L — ABNORMAL LOW (ref 3.5–5.1)
Sodium: 133 mmol/L — ABNORMAL LOW (ref 135–145)
Total Bilirubin: 1 mg/dL (ref 0.3–1.2)
Total Protein: 7.8 g/dL (ref 6.5–8.1)

## 2020-08-14 LAB — ETHANOL: Alcohol, Ethyl (B): 287 mg/dL — ABNORMAL HIGH (ref <10)

## 2020-08-14 MED ORDER — TETANUS-DIPHTH-ACELL PERTUSSIS 5-2.5-18.5 LF-MCG/0.5 IM SUSY
0.5000 mL | PREFILLED_SYRINGE | Freq: Once | INTRAMUSCULAR | Status: AC
  Administered 2020-08-14: 0.5 mL via INTRAMUSCULAR
  Filled 2020-08-14: qty 0.5

## 2020-08-14 MED ORDER — CHLORDIAZEPOXIDE HCL 25 MG PO CAPS
25.0000 mg | ORAL_CAPSULE | Freq: Once | ORAL | Status: AC
  Administered 2020-08-14: 25 mg via ORAL
  Filled 2020-08-14: qty 1

## 2020-08-14 MED ORDER — LORAZEPAM 1 MG PO TABS: 0.0000 mg | ORAL_TABLET | Freq: Two times a day (BID) | ORAL | Status: DC

## 2020-08-14 MED ORDER — AMOXICILLIN-POT CLAVULANATE 875-125 MG PO TABS
1.0000 | ORAL_TABLET | Freq: Once | ORAL | Status: AC
  Administered 2020-08-14: 1 via ORAL
  Filled 2020-08-14: qty 1

## 2020-08-14 MED ORDER — THIAMINE HCL 100 MG PO TABS
100.0000 mg | ORAL_TABLET | Freq: Every day | ORAL | Status: DC
  Administered 2020-08-14: 100 mg via ORAL
  Filled 2020-08-14: qty 1

## 2020-08-14 MED ORDER — ACETAMINOPHEN 325 MG PO TABS
650.0000 mg | ORAL_TABLET | Freq: Once | ORAL | Status: AC
  Administered 2020-08-14: 650 mg via ORAL
  Filled 2020-08-14: qty 2

## 2020-08-14 MED ORDER — LORAZEPAM 2 MG/ML IJ SOLN: 0.0000 mg | Freq: Two times a day (BID) | INTRAMUSCULAR | Status: DC

## 2020-08-14 MED ORDER — LORAZEPAM 2 MG/ML IJ SOLN: 0.0000 mg | Freq: Four times a day (QID) | INTRAMUSCULAR | Status: DC

## 2020-08-14 MED ORDER — AMOXICILLIN-POT CLAVULANATE 875-125 MG PO TABS
1.0000 | ORAL_TABLET | Freq: Two times a day (BID) | ORAL | 0 refills | Status: AC
  Filled 2020-08-14: qty 10, 5d supply, fill #0

## 2020-08-14 MED ORDER — THIAMINE HCL 100 MG/ML IJ SOLN: 100.0000 mg | Freq: Every day | INTRAMUSCULAR | Status: DC

## 2020-08-14 MED ORDER — THIAMINE HCL 100 MG PO TABS: 100.0000 mg | ORAL_TABLET | Freq: Once | ORAL | Status: DC

## 2020-08-14 MED ORDER — LORAZEPAM 1 MG PO TABS
0.0000 mg | ORAL_TABLET | Freq: Four times a day (QID) | ORAL | Status: DC
  Administered 2020-08-14: 1 mg via ORAL
  Filled 2020-08-14: qty 1
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
## 2020-08-14 NOTE — Discharge Planning (Signed)
RNCM consulted regarding medication assistance of pt unable to afford medications.  RNCM utilized [facility] petty cash to cover $4 co-pay and filled through [facility].  Rx e-scribed to [facility] and delivered to pt prior to discharge home.  No further RNCM needs identified at this time.

## 2020-08-14 NOTE — ED Notes (Signed)
Pt ear cleaned up. Pt taken to xray

## 2020-08-14 NOTE — ED Notes (Signed)
Pt to xray
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
## 2020-09-04 ENCOUNTER — Emergency Department (HOSPITAL_COMMUNITY)
Admission: EM | Admit: 2020-09-04 | Discharge: 2020-09-05 | Disposition: A | Discharge status: Home / Self Care | Payer: Medicaid Other | Attending: Emergency Medicine | Admitting: Emergency Medicine

## 2020-09-04 DIAGNOSIS — Y908 Blood alcohol level of 240 mg/100 ml or more: Secondary | ICD-10-CM | POA: Insufficient documentation

## 2020-09-04 DIAGNOSIS — F10129 Alcohol abuse with intoxication, unspecified: Secondary | ICD-10-CM | POA: Insufficient documentation

## 2020-09-04 DIAGNOSIS — R45851 Suicidal ideations: Secondary | ICD-10-CM | POA: Insufficient documentation

## 2020-09-04 DIAGNOSIS — Z20822 Contact with and (suspected) exposure to covid-19: Secondary | ICD-10-CM | POA: Insufficient documentation

## 2020-09-04 DIAGNOSIS — F10920 Alcohol use, unspecified with intoxication, uncomplicated: Secondary | ICD-10-CM

## 2020-09-04 DIAGNOSIS — Z5900 Homelessness unspecified: Secondary | ICD-10-CM | POA: Insufficient documentation

## 2020-09-04 DIAGNOSIS — Z01818 Encounter for other preprocedural examination: Secondary | ICD-10-CM | POA: Insufficient documentation

## 2020-09-04 DIAGNOSIS — F1490 Cocaine use, unspecified, uncomplicated: Secondary | ICD-10-CM | POA: Insufficient documentation

## 2020-09-04 DIAGNOSIS — F1010 Alcohol abuse, uncomplicated: Secondary | ICD-10-CM

## 2020-09-04 DIAGNOSIS — F32A Depression, unspecified: Secondary | ICD-10-CM

## 2020-09-04 DIAGNOSIS — Z79899 Other long term (current) drug therapy: Secondary | ICD-10-CM | POA: Insufficient documentation

## 2020-09-04 DIAGNOSIS — I10 Essential (primary) hypertension: Secondary | ICD-10-CM | POA: Insufficient documentation

## 2020-09-04 DIAGNOSIS — F331 Major depressive disorder, recurrent, moderate: Secondary | ICD-10-CM | POA: Insufficient documentation

## 2020-09-04 DIAGNOSIS — F17210 Nicotine dependence, cigarettes, uncomplicated: Secondary | ICD-10-CM | POA: Insufficient documentation

## 2020-09-04 LAB — COMPREHENSIVE METABOLIC PANEL
ALT: 52 U/L — ABNORMAL HIGH (ref 0–44)
AST: 122 U/L — ABNORMAL HIGH (ref 15–41)
Albumin: 4 g/dL (ref 3.5–5.0)
Alkaline Phosphatase: 175 U/L — ABNORMAL HIGH (ref 38–126)
Anion gap: 13 (ref 5–15)
BUN: 5 mg/dL — ABNORMAL LOW (ref 6–20)
CO2: 23 mmol/L (ref 22–32)
Calcium: 9.2 mg/dL (ref 8.9–10.3)
Chloride: 96 mmol/L — ABNORMAL LOW (ref 98–111)
Creatinine, Ser: 0.84 mg/dL (ref 0.61–1.24)
GFR, Estimated: >60 mL/min (ref >60)
Glucose, Bld: 92 mg/dL (ref 70–99)
Potassium: 3.4 mmol/L — ABNORMAL LOW (ref 3.5–5.1)
Sodium: 132 mmol/L — ABNORMAL LOW (ref 135–145)
Total Bilirubin: 0.5 mg/dL (ref 0.3–1.2)
Total Protein: 8.5 g/dL — ABNORMAL HIGH (ref 6.5–8.1)

## 2020-09-04 LAB — CBC
HCT: 33.2 % — ABNORMAL LOW (ref 39.0–52.0)
Hemoglobin: 9.7 g/dL — ABNORMAL LOW (ref 13.0–17.0)
MCH: 21 pg — ABNORMAL LOW (ref 26.0–34.0)
MCHC: 29.2 g/dL — ABNORMAL LOW (ref 30.0–36.0)
MCV: 71.7 fL — ABNORMAL LOW (ref 80.0–100.0)
Platelets: 269 10*3/uL (ref 150–400)
RBC: 4.63 MIL/uL (ref 4.22–5.81)
RDW: 17.8 % — ABNORMAL HIGH (ref 11.5–15.5)
WBC: 6.7 10*3/uL (ref 4.0–10.5)
nRBC: 0 % (ref 0.0–0.2)

## 2020-09-04 LAB — RAPID URINE DRUG SCREEN, HOSP PERFORMED
Amphetamines: NOT DETECTED
Barbiturates: NOT DETECTED
Benzodiazepines: NOT DETECTED
Cocaine: NOT DETECTED
Opiates: NOT DETECTED
Tetrahydrocannabinol: NOT DETECTED

## 2020-09-04 LAB — ETHANOL: Alcohol, Ethyl (B): 284 mg/dL — ABNORMAL HIGH (ref <10)

## 2020-09-04 LAB — ACETAMINOPHEN LEVEL: Acetaminophen (Tylenol), Serum: <10 ug/mL — ABNORMAL LOW (ref 10–30)

## 2020-09-04 LAB — SALICYLATE LEVEL: Salicylate Lvl: <7 mg/dL — ABNORMAL LOW (ref 7.0–30.0)

## 2020-09-04 MED ORDER — THIAMINE HCL 100 MG PO TABS
100.0000 mg | ORAL_TABLET | Freq: Every day | ORAL | Status: DC
  Administered 2020-09-05: 100 mg via ORAL
  Filled 2020-09-04: qty 1

## 2020-09-04 MED ORDER — LORAZEPAM 2 MG/ML IJ SOLN: 0.0000 mg | Freq: Four times a day (QID) | INTRAMUSCULAR | Status: DC

## 2020-09-04 MED ORDER — THIAMINE HCL 100 MG/ML IJ SOLN: 100.0000 mg | Freq: Every day | INTRAMUSCULAR | Status: DC

## 2020-09-04 MED ORDER — LORAZEPAM 2 MG/ML IJ SOLN: 0.0000 mg | Freq: Two times a day (BID) | INTRAMUSCULAR | Status: DC

## 2020-09-04 MED ORDER — LORAZEPAM 1 MG PO TABS: 0.0000 mg | ORAL_TABLET | Freq: Four times a day (QID) | ORAL | Status: DC

## 2020-09-04 MED ORDER — LORAZEPAM 1 MG PO TABS: 0.0000 mg | ORAL_TABLET | Freq: Two times a day (BID) | ORAL | Status: DC
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
## 2020-09-05 LAB — RESP PANEL BY RT-PCR (FLU A&B, COVID) ARPGX2
Influenza A by PCR: NEGATIVE
Influenza B by PCR: NEGATIVE
SARS Coronavirus 2 by RT PCR: NEGATIVE

## 2020-09-05 NOTE — BH Assessment (Signed)
Contacted RN to see if pt able to participate in TTS assessment. He stated yes. Requested TTS cart to be put in patient's room. Awaiting response that cart is in place.

## 2020-09-05 NOTE — ED Provider Notes (Signed)
[facility] EMERGENCY DEPARTMENT Provider Note   CSN: [id number] Arrival date & time: 09/04/20  2115     History Chief Complaint  Patient presents with   Medical Clearance    Barry Moore is a 55 y.o. male.  The history is provided by the patient and medical records. No language interpreter was used.  Mental Health Problem Presenting symptoms: depression, suicidal thoughts and suicidal threats   Presenting symptoms: no agitation and no suicide attempt   Degree of incapacity (severity):  Severe Onset quality:  Gradual Timing:  Constant Progression:  Unchanged Chronicity:  New Context: alcohol use   Treatment compliance:  Untreated Relieved by:  Nothing Associated symptoms: no abdominal pain, no appetite change, no chest pain, no fatigue and no headaches   Risk factors: hx of suicide attempts       Past Medical History:  Diagnosis Date   Alcoholic hepatitis    Depression    ETOH abuse    Gastric bleed 08/2018   Hypertension    Pancreatitis    Seizures ([place])    Suicidal behavior     Patient Active Problem List   Diagnosis Date Noted   GIB (gastrointestinal bleeding) 06/25/2020   Suicidal ideations 05/31/2020   Nicotine dependence, cigarettes, uncomplicated 05/39/7673   Alcohol abuse with intoxication ([place]) 07/19/2019   Orthostatic syncope 07/19/2019   Upper GI bleeding 07/19/2019   Acute upper GI bleeding 07/02/2019   Duodenitis    Acute upper GI bleed 01/01/2019   Hypokalemia 01/01/2019   Alcohol use disorder, severe, dependence ([place]) 41/93/7902   Alcoholic liver disease ([place]) 01/01/2019   Acute pancreatitis 12/13/2018   Free intraperitoneal air 12/13/2018   Homicidal ideation    Gastrointestinal hemorrhage with melena 08/31/2018   Colon ulcer    Polyp of ascending colon    Acute GI bleeding 08/05/2018   Transaminitis 08/05/2018   Abdominal pain 08/05/2018   Nausea and vomiting 08/05/2018   Substance induced mood disorder ([place])  07/16/2017   Severe episode of recurrent major depressive disorder, without psychotic features ([place]) 05/15/2017   Microcytic anemia 03/18/2017   Blood loss anemia 02/21/2017   Upper GI bleed 02/08/2017   Alcohol withdrawal ([place]) 02/08/2017   Cocaine abuse ([place]) 11/18/2016   GI bleed 11/01/2016   Alcohol abuse    Major depressive disorder, recurrent severe without psychotic features ([place]) 07/06/2016   Alcohol-induced mood disorder ([place]) 06/15/2016   Seizure ([place]) 05/26/2016   Tobacco abuse 05/26/2016   Homeless 05/26/2016   Essential hypertension 05/26/2016   Alcohol dependence with alcohol-induced mood disorder ([place]) 02/14/2016   Severe alcohol withdrawal without perceptual disturbances without complication ([place]) 40/97/3532   Alcoholic hepatitis without ascites 02/14/2016   Thrombocytopenia ([place]) 02/14/2016    Past Surgical History:  Procedure Laterality Date   BIOPSY  08/05/2018   Procedure: BIOPSY;  Surgeon: Irving Copas., MD;  Location: [place];  Service: Gastroenterology;;   BIOPSY  08/07/2018   Procedure: BIOPSY;  Surgeon: Thornton Park, MD;  Location: [place];  Service: Gastroenterology;;   BIOPSY  01/02/2019   Procedure: BIOPSY;  Surgeon: Gatha Mayer, MD;  Location: [facility];  Service: Endoscopy;;   COLONOSCOPY WITH PROPOFOL N/A 08/07/2018   Procedure: COLONOSCOPY WITH PROPOFOL;  Surgeon: Thornton Park, MD;  Location: [facility] ENDOSCOPY;  Service: Gastroenterology;  Laterality: N/A;   ESOPHAGOGASTRODUODENOSCOPY N/A 01/02/2019   Procedure: ESOPHAGOGASTRODUODENOSCOPY (EGD);  Surgeon: Gatha Mayer, MD;  Location: [facility];  Service: Endoscopy;  Laterality: N/A;   ESOPHAGOGASTRODUODENOSCOPY N/A 07/03/2019  Procedure: ESOPHAGOGASTRODUODENOSCOPY (EGD);  Surgeon: Wonda Horner, MD;  Location: [facility];  Service: Endoscopy;  Laterality: N/A;   ESOPHAGOGASTRODUODENOSCOPY N/A 06/26/2020   Procedure: ESOPHAGOGASTRODUODENOSCOPY (EGD);  Surgeon:  Arta Silence, MD;  Location: [facility];  Service: Endoscopy;  Laterality: N/A;   ESOPHAGOGASTRODUODENOSCOPY (EGD) WITH PROPOFOL N/A 11/02/2016   Procedure: ESOPHAGOGASTRODUODENOSCOPY (EGD) WITH PROPOFOL;  Surgeon: Carol Ada, MD;  Location: [facility];  Service: Endoscopy;  Laterality: N/A;   ESOPHAGOGASTRODUODENOSCOPY (EGD) WITH PROPOFOL N/A 08/05/2018   Procedure: ESOPHAGOGASTRODUODENOSCOPY (EGD) WITH PROPOFOL;  Surgeon: Rush Landmark Telford Nab., MD;  Location: [place];  Service: Gastroenterology;  Laterality: N/A;   ESOPHAGOGASTRODUODENOSCOPY (EGD) WITH PROPOFOL N/A 07/14/2019   Procedure: ESOPHAGOGASTRODUODENOSCOPY (EGD) WITH PROPOFOL;  Surgeon: Otis Brace, MD;  Location: [facility];  Service: Gastroenterology;  Laterality: N/A;   HERNIA REPAIR     LEG SURGERY     POLYPECTOMY  08/07/2018   Procedure: POLYPECTOMY;  Surgeon: Thornton Park, MD;  Location: [facility] ENDOSCOPY;  Service: Gastroenterology;;       Family History  Problem Relation Age of Onset   Diabetes Mother    Alcoholism Mother    Emphysema Father    Lung cancer Father    Alcoholism Father     Social History   Tobacco Use   Smoking status: Every Day    Packs/day: 1.00    Pack years: 0.00    Types: Cigarettes   Smokeless tobacco: Never  Vaping Use   Vaping Use: Never used  Substance Use Topics   Alcohol use: Yes    Comment: 5+ BEERS DAILY, Patient does not know how much he drank tonight   Drug use: Not Currently    Frequency: 3.0 times per week    Types: Cocaine    Home Medications Prior to Admission medications   Medication Sig Start Date End Date Taking? Authorizing Provider  amLODipine (NORVASC) 5 MG tablet Take 1 tablet (5 mg total) by mouth daily. Patient not taking: Reported on 08/14/2020 06/27/20 08/14/20  Charlynne Cousins, MD  ferrous sulfate 325 (65 FE) MG tablet Take 1 tablet (325 mg total) by mouth daily with breakfast. Patient not taking: Reported on 09/23/2018 08/09/18 10/09/18   Elodia Florence., MD  pantoprazole (PROTONIX) 40 MG tablet Take 1 tablet (40 mg total) by mouth 2 (two) times daily before a meal. Patient not taking: Reported on 08/14/2020 06/27/20 08/14/20  Charlynne Cousins, MD    Allergies    Tomato, Aspirin, and Sulfa antibiotics  Review of Systems   Review of Systems  Constitutional:  Negative for appetite change, chills, fatigue and fever.  HENT:  Negative for congestion.   Eyes:  Negative for visual disturbance.  Respiratory:  Negative for cough, chest tightness, shortness of breath and wheezing.   Cardiovascular:  Negative for chest pain.  Gastrointestinal:  Negative for abdominal pain, constipation, diarrhea, nausea and vomiting.  Genitourinary:  Negative for flank pain.  Musculoskeletal:  Negative for back pain, neck pain and neck stiffness.  Neurological:  Negative for dizziness, weakness, light-headedness and headaches.  Psychiatric/Behavioral:  Positive for suicidal ideas. Negative for agitation and confusion.   All other systems reviewed and are negative.  Physical Exam Updated Vital Signs BP 122/75   Pulse 84   Temp 98 F (36.7 C) (Oral)   Resp 16   SpO2 98%   Physical Exam Vitals and nursing note reviewed.  Constitutional:      General: He is not in acute distress.    Appearance: He is  well-developed. He is not ill-appearing, toxic-appearing or diaphoretic.  HENT:     Head: Normocephalic and atraumatic.     Mouth/Throat:     Mouth: Mucous membranes are moist.  Eyes:     Extraocular Movements: Extraocular movements intact.     Conjunctiva/sclera: Conjunctivae normal.     Pupils: Pupils are equal, round, and reactive to light.  Cardiovascular:     Rate and Rhythm: Normal rate and regular rhythm.     Heart sounds: No murmur heard. Pulmonary:     Effort: Pulmonary effort is normal. No respiratory distress.     Breath sounds: Normal breath sounds. No wheezing, rhonchi or rales.  Chest:     Chest wall: No  tenderness.  Abdominal:     Palpations: Abdomen is soft.     Tenderness: There is no abdominal tenderness. There is no right CVA tenderness, left CVA tenderness, guarding or rebound.  Musculoskeletal:        General: No tenderness.     Cervical back: Neck supple. No tenderness.  Skin:    General: Skin is warm and dry.     Capillary Refill: Capillary refill takes less than 2 seconds.     Findings: No erythema.  Neurological:     General: No focal deficit present.     Mental Status: He is alert.  Psychiatric:        Mood and Affect: Mood is depressed.        Thought Content: Thought content includes suicidal ideation. Thought content does not include homicidal ideation. Thought content includes suicidal plan. Thought content does not include homicidal plan.     Comments: Patient report suicidal ideation with a plan to use a knife to kill himself.  He reportedly had a knife on scene with law enforcement.    ED Results / Procedures / Treatments   Labs (all labs ordered are listed, but only abnormal results are displayed) Labs Reviewed  COMPREHENSIVE METABOLIC PANEL - Abnormal; Notable for the following components:      Result Value   Sodium 132 (*)    Potassium 3.4 (*)    Chloride 96 (*)    BUN 5 (*)    Total Protein 8.5 (*)    AST 122 (*)    ALT 52 (*)    Alkaline Phosphatase 175 (*)    All other components within normal limits  ETHANOL - Abnormal; Notable for the following components:   Alcohol, Ethyl (B) 284 (*)    All other components within normal limits  SALICYLATE LEVEL - Abnormal; Notable for the following components:   Salicylate Lvl <9.4 (*)    All other components within normal limits  ACETAMINOPHEN LEVEL - Abnormal; Notable for the following components:   Acetaminophen (Tylenol), Serum <10 (*)    All other components within normal limits  CBC - Abnormal; Notable for the following components:   Hemoglobin 9.7 (*)    HCT 33.2 (*)    MCV 71.7 (*)    MCH 21.0 (*)     MCHC 29.2 (*)    RDW 17.8 (*)    All other components within normal limits  RESP PANEL BY RT-PCR (FLU A&B, COVID) ARPGX2  RAPID URINE DRUG SCREEN, HOSP PERFORMED    EKG None  Radiology No results found.  Procedures Procedures   Medications Ordered in ED Medications  LORazepam (ATIVAN) injection 0-4 mg (has no administration in time range)    Or  LORazepam (ATIVAN) tablet 0-4 mg (has no administration in  time range)  LORazepam (ATIVAN) injection 0-4 mg (has no administration in time range)    Or  LORazepam (ATIVAN) tablet 0-4 mg (has no administration in time range)  thiamine tablet 100 mg (has no administration in time range)    Or  thiamine (B-1) injection 100 mg (has no administration in time range)    ED Course  I have reviewed the triage vital signs and the nursing notes.  Pertinent labs & imaging results that were available during my care of the patient were reviewed by me and considered in my medical decision making (see chart for details).    MDM Rules/Calculators/A&P                          Barry Moore is a 55 y.o. male with a past medical history significant for depression, alcohol abuse, prior GI bleed, homelessness, seizures, and previous suicidal ideation who presents with suicidal ideation.  According to patient, he has been feeling more depressed and suicidal and tonight was going to kill himself with a knife.  He reportedly had a knife on scene that was removed by law enforcement.  He wants help and does not want to kill himself but he is still having the suicidal thoughts.  He reports he is hearing voices but denies visual [place].  Denies any trauma.  Denies any other physical complaints today.  He does report he  drinks beer and was intoxicated tonight.  He denies any homicidal thoughts.  Denies any other complaints physically.  On exam, lungs clear and chest nontender.  Abdomen nontender.  Patient moving all extremities.  Patient is reporting  depression and continued suicidal ideation with a plan to kill himself with a knife.  Patient has ring labs in triage that were overall reassuring aside from elevated liver function likely due to his alcohol abuse and is somewhat similar to prior..  We will get a COVID and flu test.  At this time, patient is felt to be medically cleared and will consult TTS although patient is somewhat still intoxicated.  Anticipate they will speak to him in the morning to determine disposition.  He is voluntary at this time and wants help.    Final Clinical Impression(s) / ED Diagnoses Final diagnoses:  Depression, unspecified depression type  Suicidal ideation  Alcohol abuse  Alcoholic intoxication without complication (HCC)   Clinical Impression: 1. Depression, unspecified depression type   2. Suicidal ideation   3. Alcohol abuse   4. Alcoholic intoxication without complication (HCC)     Disposition: Awaiting TTS recommendations.  This note was prepared with assistance of Systems analyst. Occasional wrong-word or sound-a-like substitutions may have occurred due to the inherent limitations of voice recognition software.     Surya Folden, Gwenyth Allegra, MD 09/05/20 [phone]
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
## 2020-09-05 NOTE — ED Provider Notes (Signed)
Patient is now sober.  He was evaluated last night it was recommended that he be observed.  Patient denies any suicidal thoughts at this time.  He states he is a little hung over but otherwise feeling pretty good.  Do not feel that patient needs any further psychiatric evaluation and he was discharged.   Blanchie Dessert, MD 09/05/20 [phone]

## 2020-09-19 ENCOUNTER — Encounter (HOSPITAL_COMMUNITY): Payer: Self-pay | Admitting: Emergency Medicine

## 2020-09-19 ENCOUNTER — Emergency Department (HOSPITAL_COMMUNITY): Payer: Self-pay

## 2020-09-19 ENCOUNTER — Emergency Department (HOSPITAL_COMMUNITY)
Admission: EM | Admit: 2020-09-19 | Discharge: 2020-09-19 | Disposition: A | Discharge status: Home / Self Care | Payer: Self-pay | Attending: Emergency Medicine | Admitting: Emergency Medicine

## 2020-09-19 DIAGNOSIS — Z79899 Other long term (current) drug therapy: Secondary | ICD-10-CM | POA: Insufficient documentation

## 2020-09-19 DIAGNOSIS — M542 Cervicalgia: Secondary | ICD-10-CM | POA: Insufficient documentation

## 2020-09-19 DIAGNOSIS — G8929 Other chronic pain: Secondary | ICD-10-CM | POA: Insufficient documentation

## 2020-09-19 DIAGNOSIS — R519 Headache, unspecified: Secondary | ICD-10-CM | POA: Insufficient documentation

## 2020-09-19 DIAGNOSIS — R569 Unspecified convulsions: Secondary | ICD-10-CM | POA: Insufficient documentation

## 2020-09-19 DIAGNOSIS — F10129 Alcohol abuse with intoxication, unspecified: Secondary | ICD-10-CM | POA: Insufficient documentation

## 2020-09-19 DIAGNOSIS — F10920 Alcohol use, unspecified with intoxication, uncomplicated: Secondary | ICD-10-CM

## 2020-09-19 DIAGNOSIS — F17210 Nicotine dependence, cigarettes, uncomplicated: Secondary | ICD-10-CM | POA: Insufficient documentation

## 2020-09-19 DIAGNOSIS — R1084 Generalized abdominal pain: Secondary | ICD-10-CM | POA: Insufficient documentation

## 2020-09-19 DIAGNOSIS — I10 Essential (primary) hypertension: Secondary | ICD-10-CM | POA: Insufficient documentation

## 2020-09-19 DIAGNOSIS — Y908 Blood alcohol level of 240 mg/100 ml or more: Secondary | ICD-10-CM | POA: Insufficient documentation

## 2020-09-19 LAB — CBC WITH DIFFERENTIAL/PLATELET
Abs Immature Granulocytes: 0.01 10*3/uL (ref 0.00–0.07)
Basophils Absolute: 0.1 10*3/uL (ref 0.0–0.1)
Basophils Relative: 1 %
Eosinophils Absolute: 0.2 10*3/uL (ref 0.0–0.5)
Eosinophils Relative: 3 %
HCT: 34.5 % — ABNORMAL LOW (ref 39.0–52.0)
Hemoglobin: 10 g/dL — ABNORMAL LOW (ref 13.0–17.0)
Immature Granulocytes: 0 %
Lymphocytes Relative: 45 %
Lymphs Abs: 2.3 10*3/uL (ref 0.7–4.0)
MCH: 21 pg — ABNORMAL LOW (ref 26.0–34.0)
MCHC: 29 g/dL — ABNORMAL LOW (ref 30.0–36.0)
MCV: 72.3 fL — ABNORMAL LOW (ref 80.0–100.0)
Monocytes Absolute: 0.7 10*3/uL (ref 0.1–1.0)
Monocytes Relative: 15 %
Neutro Abs: 1.8 10*3/uL (ref 1.7–7.7)
Neutrophils Relative %: 36 %
Platelets: 256 10*3/uL (ref 150–400)
RBC: 4.77 MIL/uL (ref 4.22–5.81)
RDW: 19.5 % — ABNORMAL HIGH (ref 11.5–15.5)
WBC: 5 10*3/uL (ref 4.0–10.5)
nRBC: 0 % (ref 0.0–0.2)

## 2020-09-19 LAB — ETHANOL: Alcohol, Ethyl (B): 308 mg/dL (ref <10)

## 2020-09-19 LAB — RAPID URINE DRUG SCREEN, HOSP PERFORMED
Amphetamines: NOT DETECTED
Barbiturates: NOT DETECTED
Benzodiazepines: NOT DETECTED
Cocaine: NOT DETECTED
Opiates: NOT DETECTED
Tetrahydrocannabinol: NOT DETECTED

## 2020-09-19 LAB — COMPREHENSIVE METABOLIC PANEL
ALT: 66 U/L — ABNORMAL HIGH (ref 0–44)
AST: 112 U/L — ABNORMAL HIGH (ref 15–41)
Albumin: 4.5 g/dL (ref 3.5–5.0)
Alkaline Phosphatase: 142 U/L — ABNORMAL HIGH (ref 38–126)
Anion gap: 10 (ref 5–15)
BUN: <5 mg/dL — ABNORMAL LOW (ref 6–20)
CO2: 24 mmol/L (ref 22–32)
Calcium: 8.9 mg/dL (ref 8.9–10.3)
Chloride: 103 mmol/L (ref 98–111)
Creatinine, Ser: 0.51 mg/dL — ABNORMAL LOW (ref 0.61–1.24)
GFR, Estimated: >60 mL/min (ref >60)
Glucose, Bld: 101 mg/dL — ABNORMAL HIGH (ref 70–99)
Potassium: 3.4 mmol/L — ABNORMAL LOW (ref 3.5–5.1)
Sodium: 137 mmol/L (ref 135–145)
Total Bilirubin: 0.5 mg/dL (ref 0.3–1.2)
Total Protein: 8.7 g/dL — ABNORMAL HIGH (ref 6.5–8.1)

## 2020-09-19 LAB — URINALYSIS, ROUTINE W REFLEX MICROSCOPIC
Bacteria, UA: NONE SEEN
Bilirubin Urine: NEGATIVE
Glucose, UA: NEGATIVE mg/dL
Ketones, ur: NEGATIVE mg/dL
Leukocytes,Ua: NEGATIVE
Nitrite: NEGATIVE
Protein, ur: NEGATIVE mg/dL
Specific Gravity, Urine: 1.002 — ABNORMAL LOW (ref 1.005–1.030)
pH: 5 (ref 5.0–8.0)

## 2020-09-19 LAB — CBG MONITORING, ED: Glucose-Capillary: 93 mg/dL (ref 70–99)

## 2020-09-19 LAB — LIPASE, BLOOD: Lipase: 45 U/L (ref 11–51)

## 2020-09-19 LAB — MAGNESIUM: Magnesium: 2.3 mg/dL (ref 1.7–2.4)

## 2020-09-19 MED ORDER — LORAZEPAM 2 MG/ML IJ SOLN: 0.0000 mg | Freq: Two times a day (BID) | INTRAMUSCULAR | Status: DC

## 2020-09-19 MED ORDER — ONDANSETRON HCL 4 MG/2ML IJ SOLN: 4.0000 mg | Freq: Once | INTRAMUSCULAR | Status: DC | PRN

## 2020-09-19 MED ORDER — ACETAMINOPHEN 500 MG PO TABS: 1000.0000 mg | ORAL_TABLET | Freq: Once | ORAL | Status: DC

## 2020-09-19 MED ORDER — SODIUM CHLORIDE 0.9 % IV BOLUS
1000.0000 mL | Freq: Once | INTRAVENOUS | Status: AC
  Administered 2020-09-19: 1000 mL via INTRAVENOUS

## 2020-09-19 MED ORDER — LORAZEPAM 1 MG PO TABS: 0.0000 mg | ORAL_TABLET | Freq: Two times a day (BID) | ORAL | Status: DC

## 2020-09-19 MED ORDER — LORAZEPAM 2 MG/ML IJ SOLN
0.0000 mg | Freq: Four times a day (QID) | INTRAMUSCULAR | Status: DC
  Administered 2020-09-19: 1 mg via INTRAVENOUS
  Filled 2020-09-19: qty 1

## 2020-09-19 MED ORDER — LORAZEPAM 1 MG PO TABS: 0.0000 mg | ORAL_TABLET | Freq: Four times a day (QID) | ORAL | Status: DC

## 2020-09-19 MED ORDER — THIAMINE HCL 100 MG PO TABS: 100.0000 mg | ORAL_TABLET | Freq: Every day | ORAL | Status: DC

## 2020-09-19 MED ORDER — THIAMINE HCL 100 MG/ML IJ SOLN
100.0000 mg | Freq: Every day | INTRAMUSCULAR | Status: DC
  Administered 2020-09-19: 100 mg via INTRAVENOUS
  Filled 2020-09-19: qty 2

## 2020-09-19 NOTE — ED Notes (Signed)
ED Provider at bedside. 

## 2020-09-19 NOTE — ED Provider Notes (Signed)
[facility] DEPT Provider Note   CSN: [id number] Arrival date & time: 09/19/20  5784     History Chief Complaint  Patient presents with   Alcohol Problem    Barry Moore is a 55 y.o. male.  HPI 55 year old male presents with possible seizures and alcohol abuse.  He states he drinks probably a case of beer per day and has drank since around 4 AM.  He thinks he had 2 seizures and when asked him to describe why he states that he has felt disoriented and off.  This is similar to what he feels like previous seizures have felt like.  He has been having a headache for 2 weeks and does endorse some falls.  Some right lateral neck pain.  He also has chronic abdominal pain that he states has been for quite some time.  He has been having a lot of nausea and vomiting for the last couple weeks that he states is like mucus but not bloody.  Past Medical History:  Diagnosis Date   Alcoholic hepatitis    Depression    ETOH abuse    Gastric bleed 08/2018   Hypertension    Pancreatitis    Seizures ([place])    Suicidal behavior     Patient Active Problem List   Diagnosis Date Noted   GIB (gastrointestinal bleeding) 06/25/2020   Suicidal ideations 05/31/2020   Nicotine dependence, cigarettes, uncomplicated 69/62/9528   Alcohol abuse with intoxication ([place]) 07/19/2019   Orthostatic syncope 07/19/2019   Upper GI bleeding 07/19/2019   Acute upper GI bleeding 07/02/2019   Duodenitis    Acute upper GI bleed 01/01/2019   Hypokalemia 01/01/2019   Alcohol use disorder, severe, dependence ([place]) 41/32/4401   Alcoholic liver disease ([place]) 01/01/2019   Acute pancreatitis 12/13/2018   Free intraperitoneal air 12/13/2018   Homicidal ideation    Gastrointestinal hemorrhage with melena 08/31/2018   Colon ulcer    Polyp of ascending colon    Acute GI bleeding 08/05/2018   Transaminitis 08/05/2018   Abdominal pain 08/05/2018   Nausea and vomiting 08/05/2018   Substance  induced mood disorder ([place]) 07/16/2017   Severe episode of recurrent major depressive disorder, without psychotic features ([place]) 05/15/2017   Microcytic anemia 03/18/2017   Blood loss anemia 02/21/2017   Upper GI bleed 02/08/2017   Alcohol withdrawal ([place]) 02/08/2017   Cocaine abuse ([place]) 11/18/2016   GI bleed 11/01/2016   Alcohol abuse    Major depressive disorder, 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/06/2016   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/15/2016   Seizure ([place]) 05/26/2016   Tobacco abuse 05/26/2016   Homeless 05/26/2016   Essential hypertension 05/26/2016   Alcohol dependence with alcohol-induced mood disorder ([place]) 02/14/2016   Severe alcohol withdrawal without perceptual disturbances without complication ([place]) 02/72/5366   Alcoholic hepatitis without ascites 02/14/2016   Thrombocytopenia ([name]) 02/14/2016    Past Surgical History:  Procedure Laterality Date   BIOPSY  08/05/2018   Procedure: BIOPSY;  Surgeon: Irving Copas., MD;  Location: [facility];  Service: Gastroenterology;;   BIOPSY  08/07/2018   Procedure: BIOPSY;  Surgeon: Thornton Park, MD;  Location: [facility];  Service: Gastroenterology;;   BIOPSY  01/02/2019   Procedure: BIOPSY;  Surgeon: Gatha Mayer, MD;  Location: [facility];  Service: Endoscopy;;   COLONOSCOPY WITH PROPOFOL N/A 08/07/2018   Procedure: COLONOSCOPY WITH PROPOFOL;  Surgeon: Thornton Park, MD;  Location: [facility];  Service: Gastroenterology;  Laterality: N/A;   ESOPHAGOGASTRODUODENOSCOPY N/A  01/02/2019   Procedure: ESOPHAGOGASTRODUODENOSCOPY (EGD);  Surgeon: Gatha Mayer, MD;  Location: [facility];  Service: Endoscopy;  Laterality: N/A;   ESOPHAGOGASTRODUODENOSCOPY N/A 07/03/2019   Procedure: ESOPHAGOGASTRODUODENOSCOPY (EGD);  Surgeon: Wonda Horner, MD;  Location: [facility];  Service: Endoscopy;  Laterality: N/A;   ESOPHAGOGASTRODUODENOSCOPY N/A 06/26/2020   Procedure:  ESOPHAGOGASTRODUODENOSCOPY (EGD);  Surgeon: Arta Silence, MD;  Location: [facility];  Service: Endoscopy;  Laterality: N/A;   ESOPHAGOGASTRODUODENOSCOPY (EGD) WITH PROPOFOL N/A 11/02/2016   Procedure: ESOPHAGOGASTRODUODENOSCOPY (EGD) WITH PROPOFOL;  Surgeon: Carol Ada, MD;  Location: [facility];  Service: Endoscopy;  Laterality: N/A;   ESOPHAGOGASTRODUODENOSCOPY (EGD) WITH PROPOFOL N/A 08/05/2018   Procedure: ESOPHAGOGASTRODUODENOSCOPY (EGD) WITH PROPOFOL;  Surgeon: Rush Landmark Telford Nab., MD;  Location: [facility];  Service: Gastroenterology;  Laterality: N/A;   ESOPHAGOGASTRODUODENOSCOPY (EGD) WITH PROPOFOL N/A 07/14/2019   Procedure: ESOPHAGOGASTRODUODENOSCOPY (EGD) WITH PROPOFOL;  Surgeon: Otis Brace, MD;  Location: [facility];  Service: Gastroenterology;  Laterality: N/A;   HERNIA REPAIR     LEG SURGERY     POLYPECTOMY  08/07/2018   Procedure: POLYPECTOMY;  Surgeon: Thornton Park, MD;  Location: [facility];  Service: Gastroenterology;;       Family History  Problem Relation Age of Onset   Diabetes Mother    Alcoholism Mother    Emphysema Father    Lung cancer Father    Alcoholism Father     Social History   Tobacco Use   Smoking status: Every Day    Packs/day: 1.00    Pack years: 0.00    Types: Cigarettes   Smokeless tobacco: Never  Vaping Use   Vaping Use: Never used  Substance Use Topics   Alcohol use: Yes    Comment: 5+ BEERS DAILY, Patient does not know how much he drank tonight   Drug use: Not Currently    Frequency: 3.0 times per week    Types: Cocaine    Home Medications Prior to Admission medications   Medication Sig Start Date End Date Taking? Authorizing Provider  amLODipine (NORVASC) 5 MG tablet Take 1 tablet (5 mg total) by mouth daily. Patient not taking: Reported on 08/14/2020 06/27/20 08/14/20  Charlynne Cousins, MD  ferrous sulfate 325 (65 FE) MG tablet Take 1 tablet (325 mg total) by mouth daily with breakfast. Patient not  taking: Reported on 09/23/2018 08/09/18 10/09/18  Elodia Florence., MD  pantoprazole (PROTONIX) 40 MG tablet Take 1 tablet (40 mg total) by mouth 2 (two) times daily before a meal. Patient not taking: Reported on 08/14/2020 06/27/20 08/14/20  Charlynne Cousins, MD    Allergies    Tomato, Aspirin, and Sulfa antibiotics  Review of Systems   Review of Systems  Gastrointestinal:  Positive for abdominal pain and vomiting.  Musculoskeletal:  Positive for neck pain.  Neurological:  Positive for headaches.  All other systems reviewed and are negative.  Physical Exam Updated Vital Signs BP (!) 143/87   Pulse 86   Temp 98.6 F (37 C) (Oral)   Resp 16   SpO2 97%   Physical Exam Vitals and nursing note reviewed.  Constitutional:      General: He is not in acute distress.    Appearance: He is well-developed.     Comments: Heavy alcohol smell  HENT:     Head: Normocephalic and atraumatic.     Right Ear: External ear normal.     Left Ear: External ear normal.     Nose: Nose normal.  Mouth/Throat:     Comments: No obvious oral/tongue injury Eyes:     General:        Right eye: No discharge.        Left eye: No discharge.     Extraocular Movements: Extraocular movements intact.     Pupils: Pupils are equal, round, and reactive to light.  Neck:   Cardiovascular:     Rate and Rhythm: Normal rate and regular rhythm.     Heart sounds: Normal heart sounds.  Pulmonary:     Effort: Pulmonary effort is normal.     Breath sounds: Normal breath sounds.  Abdominal:     General: There is no distension.     Palpations: Abdomen is soft.     Tenderness: There is abdominal tenderness (generalized, worst in upper abdomen).  Musculoskeletal:     Cervical back: Normal range of motion and neck supple. Muscular tenderness present. No spinous process tenderness.     Thoracic back: No tenderness.     Lumbar back: No tenderness.  Skin:    General: Skin is warm and dry.  Neurological:      Mental Status: He is alert.     Comments: CN 3-12 grossly intact. 5/5 strength in all 4 extremities. Grossly normal sensation. Normal finger to nose.   Psychiatric:        Mood and Affect: Mood is not anxious.    ED Results / Procedures / Treatments   Labs (all labs ordered are listed, but only abnormal results are displayed) Labs Reviewed  COMPREHENSIVE METABOLIC PANEL - Abnormal; Notable for the following components:      Result Value   Potassium 3.4 (*)    Glucose, Bld 101 (*)    BUN <5 (*)    Creatinine, Ser 0.51 (*)    Total Protein 8.7 (*)    AST 112 (*)    ALT 66 (*)    Alkaline Phosphatase 142 (*)    All other components within normal limits  ETHANOL - Abnormal; Notable for the following components:   Alcohol, Ethyl (B) 308 (*)    All other components within normal limits  CBC WITH DIFFERENTIAL/PLATELET - Abnormal; Notable for the following components:   Hemoglobin 10.0 (*)    HCT 34.5 (*)    MCV 72.3 (*)    MCH 21.0 (*)    MCHC 29.0 (*)    RDW 19.5 (*)    All other components within normal limits  URINALYSIS, ROUTINE W REFLEX MICROSCOPIC - Abnormal; Notable for the following components:   Color, Urine STRAW (*)    Specific Gravity, Urine 1.002 (*)    Hgb urine dipstick SMALL (*)    All other components within normal limits  LIPASE, BLOOD  RAPID URINE DRUG SCREEN, HOSP PERFORMED  MAGNESIUM  CBG MONITORING, ED    EKG EKG Interpretation  Date/Time:  Friday September 19 2020 11:28:55 EDT Ventricular Rate:  75 PR Interval:  170 QRS Duration: 104 QT Interval:  418 QTC Calculation: 466 R Axis:   89 Text Interpretation: Normal sinus rhythm Normal ECG similar to May 2022 Confirmed by Sherwood Gambler [phone]) on 09/19/2020 11:41:06 AM  Radiology CT Head Wo Contrast  Result Date: 09/19/2020 CLINICAL DATA:  54 year old male with head and neck injury and possible seizure today. EXAM: CT HEAD WITHOUT CONTRAST CT CERVICAL SPINE WITHOUT CONTRAST TECHNIQUE: Multidetector CT  imaging of the head and cervical spine was performed following the standard protocol without intravenous contrast. Multiplanar CT image reconstructions of the  cervical spine were also generated. COMPARISON:  08/14/2020 FINDINGS: CT HEAD FINDINGS Brain: No evidence of acute infarction, hemorrhage, hydrocephalus, extra-axial collection or mass lesion/mass effect. Atrophy and mild chronic small-vessel white matter ischemic changes are again noted. Vascular: Carotid atherosclerotic calcifications are noted. Skull: No acute abnormality. Sinuses/Orbits: No acute abnormality Other: None CT CERVICAL SPINE FINDINGS Alignment: Normal. Skull base and vertebrae: No acute fracture of the cervical spine. No primary bone lesion or focal pathologic process. Soft tissues and spinal canal: No prevertebral fluid or swelling. No visible canal hematoma. Disc levels:  No significant changes or abnormalities. Upper chest: New fractures with developing callus formation noted of the MEDIAL RIGHT clavicle and posterior LEFT 1st rib, compatible with healing subacute fractures. Other: None IMPRESSION: 1. No evidence of acute intracranial abnormality. Atrophy and chronic small-vessel white matter ischemic changes. 2. No static evidence of acute injury to the cervical spine. 3. New healing subacute fractures of the MEDIAL RIGHT clavicle and posterior LEFT 1st rib. Electronically Signed   By: Margarette Canada M.D.   On: 09/19/2020 09:53   CT Cervical Spine Wo Contrast  Result Date: 09/19/2020 CLINICAL DATA:  55 year old male with head and neck injury and possible seizure today. EXAM: CT HEAD WITHOUT CONTRAST CT CERVICAL SPINE WITHOUT CONTRAST TECHNIQUE: Multidetector CT imaging of the head and cervical spine was performed following the standard protocol without intravenous contrast. Multiplanar CT image reconstructions of the cervical spine were also generated. COMPARISON:  08/14/2020 FINDINGS: CT HEAD FINDINGS Brain: No evidence of acute  infarction, hemorrhage, hydrocephalus, extra-axial collection or mass lesion/mass effect. Atrophy and mild chronic small-vessel white matter ischemic changes are again noted. Vascular: Carotid atherosclerotic calcifications are noted. Skull: No acute abnormality. Sinuses/Orbits: No acute abnormality Other: None CT CERVICAL SPINE FINDINGS Alignment: Normal. Skull base and vertebrae: No acute fracture of the cervical spine. No primary bone lesion or focal pathologic process. Soft tissues and spinal canal: No prevertebral fluid or swelling. No visible canal hematoma. Disc levels:  No significant changes or abnormalities. Upper chest: New fractures with developing callus formation noted of the MEDIAL RIGHT clavicle and posterior LEFT 1st rib, compatible with healing subacute fractures. Other: None IMPRESSION: 1. No evidence of acute intracranial abnormality. Atrophy and chronic small-vessel white matter ischemic changes. 2. No static evidence of acute injury to the cervical spine. 3. New healing subacute fractures of the MEDIAL RIGHT clavicle and posterior LEFT 1st rib. Electronically Signed   By: Margarette Canada M.D.   On: 09/19/2020 09:53   DG ABD ACUTE 2+V W 1V CHEST  Result Date: 09/19/2020 CLINICAL DATA:  Abdominal pain, vomiting. EXAM: DG ABDOMEN ACUTE WITH 1 VIEW CHEST COMPARISON:  Jul 30, 2017. FINDINGS: There is no evidence of dilated bowel loops or free intraperitoneal air. No radiopaque calculi or other significant radiographic abnormality is seen. Heart size and mediastinal contours are within normal limits. Both lungs are clear. IMPRESSION: Negative abdominal radiographs.  No acute cardiopulmonary disease. Electronically Signed   By: Marijo Conception M.D.   On: 09/19/2020 10:11    Procedures Procedures   Medications Ordered in ED Medications  LORazepam (ATIVAN) injection 0-4 mg (1 mg Intravenous Given 09/19/20 1013)    Or  LORazepam (ATIVAN) tablet 0-4 mg ( Oral See Alternative 09/19/20 1013)  LORazepam  (ATIVAN) injection 0-4 mg (has no administration in time range)    Or  LORazepam (ATIVAN) tablet 0-4 mg (has no administration in time range)  thiamine tablet 100 mg ( Oral See Alternative 09/19/20 1013)  Or  thiamine (B-1) injection 100 mg (100 mg Intravenous Given 09/19/20 1013)  acetaminophen (TYLENOL) tablet 1,000 mg (0 mg Oral Hold 09/19/20 1110)  ondansetron (ZOFRAN) injection 4 mg (has no administration in time range)  sodium chloride 0.9 % bolus 1,000 mL (0 mLs Intravenous Stopped 09/19/20 1321)    ED Course  I have reviewed the triage vital signs and the nursing notes.  Pertinent labs & imaging results that were available during my care of the patient were reviewed by me and considered in my medical decision making (see chart for details).    MDM Rules/Calculators/A&P                          Patient reports a questionable seizure at home.  No witnessed seizures.  He is also got multiple other vague complaints.  He has had similar type complaints but never any obvious seizures per chart review I can see.  He was intoxicated on arrival and slept for several hours.  When he woke up he states he is now significantly better and feels well enough for discharge.  I do not think he needs antiepileptics at this point.  Work-up is been unremarkable besides the significant EtOH level.  Discharged home with return precautions. Final Clinical Impression(s) / ED Diagnoses Final diagnoses:  Alcoholic intoxication without complication [facility])    Rx / DC Orders ED Discharge Orders     None        Sherwood Gambler, MD 09/19/20 1535

## 2020-09-19 NOTE — ED Triage Notes (Signed)
Pt attempted to sign MSE waiver but signature pad not working. Pt acknowledged and accepted MSE waiver.

## 2020-09-19 NOTE — ED Notes (Signed)
Patient transported to CT 

## 2020-09-19 NOTE — ED Triage Notes (Signed)
Pt presents via EMS after c/o alcohol intoxication and reports having two seizures this morning. States he drank a 40oz beer this morning. Not post ictal with EMS. Alert and oriented. Ambulatory.

## 2020-09-20 ENCOUNTER — Other Ambulatory Visit: Payer: Self-pay

## 2020-09-20 ENCOUNTER — Emergency Department (HOSPITAL_COMMUNITY)
Admission: EM | Admit: 2020-09-20 | Discharge: 2020-09-21 | Disposition: A | Discharge status: Home / Self Care | Payer: Medicaid Other | Attending: Emergency Medicine | Admitting: Emergency Medicine

## 2020-09-20 DIAGNOSIS — Z20822 Contact with and (suspected) exposure to covid-19: Secondary | ICD-10-CM | POA: Insufficient documentation

## 2020-09-20 DIAGNOSIS — F1020 Alcohol dependence, uncomplicated: Secondary | ICD-10-CM | POA: Insufficient documentation

## 2020-09-20 DIAGNOSIS — F17210 Nicotine dependence, cigarettes, uncomplicated: Secondary | ICD-10-CM | POA: Insufficient documentation

## 2020-09-20 DIAGNOSIS — R45850 Homicidal ideations: Secondary | ICD-10-CM | POA: Insufficient documentation

## 2020-09-20 DIAGNOSIS — R45851 Suicidal ideations: Secondary | ICD-10-CM | POA: Insufficient documentation

## 2020-09-20 DIAGNOSIS — I10 Essential (primary) hypertension: Secondary | ICD-10-CM | POA: Insufficient documentation

## 2020-09-20 DIAGNOSIS — Y908 Blood alcohol level of 240 mg/100 ml or more: Secondary | ICD-10-CM | POA: Insufficient documentation

## 2020-09-20 DIAGNOSIS — F1010 Alcohol abuse, uncomplicated: Secondary | ICD-10-CM
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
## 2020-09-21 LAB — CBC WITH DIFFERENTIAL/PLATELET
Abs Immature Granulocytes: 0.01 10*3/uL (ref 0.00–0.07)
Basophils Absolute: 0.1 10*3/uL (ref 0.0–0.1)
Basophils Relative: 2 %
Eosinophils Absolute: 0.2 10*3/uL (ref 0.0–0.5)
Eosinophils Relative: 4 %
HCT: 29.3 % — ABNORMAL LOW (ref 39.0–52.0)
Hemoglobin: 8.6 g/dL — ABNORMAL LOW (ref 13.0–17.0)
Immature Granulocytes: 0 %
Lymphocytes Relative: 44 %
Lymphs Abs: 2.1 10*3/uL (ref 0.7–4.0)
MCH: 20.8 pg — ABNORMAL LOW (ref 26.0–34.0)
MCHC: 29.4 g/dL — ABNORMAL LOW (ref 30.0–36.0)
MCV: 70.9 fL — ABNORMAL LOW (ref 80.0–100.0)
Monocytes Absolute: 0.7 10*3/uL (ref 0.1–1.0)
Monocytes Relative: 14 %
Neutro Abs: 1.7 10*3/uL (ref 1.7–7.7)
Neutrophils Relative %: 36 %
Platelets: 240 10*3/uL (ref 150–400)
RBC: 4.13 MIL/uL — ABNORMAL LOW (ref 4.22–5.81)
RDW: 19.3 % — ABNORMAL HIGH (ref 11.5–15.5)
WBC: 4.8 10*3/uL (ref 4.0–10.5)
nRBC: 0 % (ref 0.0–0.2)

## 2020-09-21 LAB — COMPREHENSIVE METABOLIC PANEL
ALT: 50 U/L — ABNORMAL HIGH (ref 0–44)
AST: 83 U/L — ABNORMAL HIGH (ref 15–41)
Albumin: 4 g/dL (ref 3.5–5.0)
Alkaline Phosphatase: 121 U/L (ref 38–126)
Anion gap: 11 (ref 5–15)
BUN: 6 mg/dL (ref 6–20)
CO2: 24 mmol/L (ref 22–32)
Calcium: 8.6 mg/dL — ABNORMAL LOW (ref 8.9–10.3)
Chloride: 105 mmol/L (ref 98–111)
Creatinine, Ser: 0.66 mg/dL (ref 0.61–1.24)
GFR, Estimated: >60 mL/min (ref >60)
Glucose, Bld: 93 mg/dL (ref 70–99)
Potassium: 3.5 mmol/L (ref 3.5–5.1)
Sodium: 140 mmol/L (ref 135–145)
Total Bilirubin: 0.6 mg/dL (ref 0.3–1.2)
Total Protein: 7.8 g/dL (ref 6.5–8.1)

## 2020-09-21 LAB — RESP PANEL BY RT-PCR (FLU A&B, COVID) ARPGX2
Influenza A by PCR: NEGATIVE
Influenza B by PCR: NEGATIVE
SARS Coronavirus 2 by RT PCR: NEGATIVE

## 2020-09-21 LAB — ETHANOL: Alcohol, Ethyl (B): 255 mg/dL — ABNORMAL HIGH (ref <10)

## 2020-09-21 NOTE — Discharge Instructions (Addendum)
Follow up with your outpatient resources as previously provided.

## 2020-09-21 NOTE — ED Notes (Signed)
Pt aware of urine sample 

## 2020-09-21 NOTE — ED Provider Notes (Addendum)
[facility] DEPT Provider Note   CSN: [id number] Arrival date & time: 09/20/20  2344     History Chief Complaint  Patient presents with   Suicidal   Homicidal    Barry Moore is a 55 y.o. male.  Patient with history of alcohol dependence, homelessness, GI bleeding, HTN, hepatitis, frequent use of the ED presents for help with HI, anger, stating he is afraid he is going to lash out and hurt someone or himself. No AVH. He is not vomiting. He states he has been given outpatient resources on past visits but "I don't have any patience for that". He came tonight because feels like he is going to "do something I don't want to do".   The history is provided by the patient. No language interpreter was used.      Past Medical History:  Diagnosis Date   Alcoholic hepatitis    Depression    ETOH abuse    Gastric bleed 08/2018   Hypertension    Pancreatitis    Seizures ([place])    Suicidal behavior     Patient Active Problem List   Diagnosis Date Noted   GIB (gastrointestinal bleeding) 06/25/2020   Suicidal ideations 05/31/2020   Nicotine dependence, cigarettes, uncomplicated 75/12/2583   Alcohol abuse with intoxication ([place]) 07/19/2019   Orthostatic syncope 07/19/2019   Upper GI bleeding 07/19/2019   Acute upper GI bleeding 07/02/2019   Duodenitis    Acute upper GI bleed 01/01/2019   Hypokalemia 01/01/2019   Alcohol use disorder, severe, dependence ([place]) 27/78/2423   Alcoholic liver disease ([place]) 01/01/2019   Acute pancreatitis 12/13/2018   Free intraperitoneal air 12/13/2018   Homicidal ideation    Gastrointestinal hemorrhage with melena 08/31/2018   Colon ulcer    Polyp of ascending colon    Acute GI bleeding 08/05/2018   Transaminitis 08/05/2018   Abdominal pain 08/05/2018   Nausea and vomiting 08/05/2018   Substance induced mood disorder ([place]) 07/16/2017   Severe episode of recurrent major depressive disorder, without psychotic features  ([place]) 05/15/2017   Microcytic anemia 03/18/2017   Blood loss anemia 02/21/2017   Upper GI bleed 02/08/2017   Alcohol withdrawal ([place]) 02/08/2017   Cocaine abuse ([place]) 11/18/2016   GI bleed 11/01/2016   Alcohol abuse    Major depressive disorder, recurrent severe without psychotic features ([place]) 07/06/2016   Alcohol-induced mood disorder ([place]) 06/15/2016   Seizure ([place]) 05/26/2016   Tobacco abuse 05/26/2016   Homeless 05/26/2016   Essential hypertension 05/26/2016   Alcohol dependence with alcohol-induced mood disorder ([place]) 02/14/2016   Severe alcohol withdrawal without perceptual disturbances without complication ([place]) 53/61/4431   Alcoholic hepatitis without ascites 02/14/2016   Thrombocytopenia ([place]) 02/14/2016    Past Surgical History:  Procedure Laterality Date   BIOPSY  08/05/2018   Procedure: BIOPSY;  Surgeon: Irving Copas., MD;  Location: [facility];  Service: Gastroenterology;;   BIOPSY  08/07/2018   Procedure: BIOPSY;  Surgeon: Thornton Park, MD;  Location: [facility];  Service: Gastroenterology;;   BIOPSY  01/02/2019   Procedure: BIOPSY;  Surgeon: Gatha Mayer, MD;  Location: [facility];  Service: Endoscopy;;   COLONOSCOPY WITH PROPOFOL N/A 08/07/2018   Procedure: COLONOSCOPY WITH PROPOFOL;  Surgeon: [place] Park, MD;  Location: [facility];  Service: Gastroenterology;  Laterality: N/A;   ESOPHAGOGASTRODUODENOSCOPY N/A 01/02/2019   Procedure: ESOPHAGOGASTRODUODENOSCOPY (EGD);  Surgeon: Gatha Mayer, MD;  Location: [facility];  Service: Endoscopy;  Laterality: N/A;   ESOPHAGOGASTRODUODENOSCOPY N/A 07/03/2019  Procedure: ESOPHAGOGASTRODUODENOSCOPY (EGD);  Surgeon: Wonda Horner, MD;  Location: [facility];  Service: Endoscopy;  Laterality: N/A;   ESOPHAGOGASTRODUODENOSCOPY N/A 06/26/2020   Procedure: ESOPHAGOGASTRODUODENOSCOPY (EGD);  Surgeon: Arta Silence, MD;  Location: [facility];  Service: Endoscopy;  Laterality: N/A;    ESOPHAGOGASTRODUODENOSCOPY (EGD) WITH PROPOFOL N/A 11/02/2016   Procedure: ESOPHAGOGASTRODUODENOSCOPY (EGD) WITH PROPOFOL;  Surgeon: Carol Ada, MD;  [facility];  Service: Endoscopy;  Laterality: N/A;   ESOPHAGOGASTRODUODENOSCOPY (EGD) WITH PROPOFOL N/A 08/05/2018   Procedure: ESOPHAGOGASTRODUODENOSCOPY (EGD) WITH PROPOFOL;  Surgeon: Rush Landmark Telford Nab., MD;  Location: [facility];  Service: Gastroenterology;  Laterality: N/A;   ESOPHAGOGASTRODUODENOSCOPY (EGD) WITH PROPOFOL N/A 07/14/2019   Procedure: ESOPHAGOGASTRODUODENOSCOPY (EGD) WITH PROPOFOL;  Surgeon: Otis Brace, MD;  [facility];  Service: Gastroenterology;  Laterality: N/A;   HERNIA REPAIR     LEG SURGERY     POLYPECTOMY  08/07/2018   Procedure: POLYPECTOMY;  Surgeon: Thornton Park, MD;  Location: [facility];  Service: Gastroenterology;;       Family History  Problem Relation Age of Onset   Diabetes Mother    Alcoholism Mother    Emphysema Father    Lung cancer Father    Alcoholism Father     Social History   Tobacco Use   Smoking status: Every Day    Packs/day: 1.00    Pack years: 0.00    Types: Cigarettes   Smokeless tobacco: Never  Vaping Use   Vaping Use: Never used  Substance Use Topics   Alcohol use: Yes    Comment: 5+ BEERS DAILY, Patient does not know how much he drank tonight   Drug use: Not Currently    Frequency: 3.0 times per week    Types: Cocaine    Home Medications Prior to Admission medications   Medication Sig Start Date End Date Taking? Authorizing Provider  amLODipine (NORVASC) 5 MG tablet Take 1 tablet (5 mg total) by mouth daily. Patient not taking: Reported on 08/14/2020 06/27/20 08/14/20  Charlynne Cousins, MD  ferrous sulfate 325 (65 FE) MG tablet Take 1 tablet (325 mg total) by mouth daily with breakfast. Patient not taking: Reported on 09/23/2018 08/09/18 10/09/18  Elodia Florence., MD  pantoprazole (PROTONIX) 40 MG tablet Take 1 tablet (40 mg  total) by mouth 2 (two) times daily before a meal. Patient not taking: Reported on 08/14/2020 06/27/20 08/14/20  Charlynne Cousins, MD    Allergies    Tomato, Aspirin, and Sulfa antibiotics  Review of Systems   Review of Systems  Constitutional:  Negative for chills and fever.  HENT: Negative.    Respiratory: Negative.    Cardiovascular: Negative.   Gastrointestinal: Negative.   Musculoskeletal: Negative.   Skin: Negative.   Neurological: Negative.   Psychiatric/Behavioral:  Positive for agitation and suicidal ideas. Negative for hallucinations and self-injury.    Physical Exam Updated Vital Signs BP 134/82   Pulse 95   Temp 99.2 F (37.3 C) (Oral)   Resp 17   Ht 5\' 7"  (1.702 m)   Wt 79.4 kg   SpO2 95%   BMI 27.41 kg/m   Physical Exam Vitals and nursing note reviewed.  Constitutional:      Appearance: He is well-developed.  HENT:     Head: Normocephalic.  Cardiovascular:     Rate and Rhythm: Normal rate.  Pulmonary:     Effort: Pulmonary effort is normal.  Abdominal:     General: There is no distension.  Musculoskeletal:  General: Normal range of motion.     Cervical back: Normal range of motion and neck supple.  Skin:    General: Skin is warm and dry.  Neurological:     General: No focal deficit present.     Mental Status: He is alert and oriented to person, place, and time.    ED Results / Procedures / Treatments   Labs (all labs ordered are listed, but only abnormal results are displayed) Labs Reviewed - No data to display  EKG None  Radiology CT Head Wo Contrast  Result Date: 09/19/2020 CLINICAL DATA:  55 year old male with head and neck injury and possible seizure today. EXAM: CT HEAD WITHOUT CONTRAST CT CERVICAL SPINE WITHOUT CONTRAST TECHNIQUE: Multidetector CT imaging of the head and cervical spine was performed following the standard protocol without intravenous contrast. Multiplanar CT image reconstructions of the cervical spine were also  generated. COMPARISON:  08/14/2020 FINDINGS: CT HEAD FINDINGS Brain: No evidence of acute infarction, hemorrhage, hydrocephalus, extra-axial collection or mass lesion/mass effect. Atrophy and mild chronic small-vessel white matter ischemic changes are again noted. Vascular: Carotid atherosclerotic calcifications are noted. Skull: No acute abnormality. Sinuses/Orbits: No acute abnormality Other: None CT CERVICAL SPINE FINDINGS Alignment: Normal. Skull base and vertebrae: No acute fracture of the cervical spine. No primary bone lesion or focal pathologic process. Soft tissues and spinal canal: No prevertebral fluid or swelling. No visible canal hematoma. Disc levels:  No significant changes or abnormalities. Upper chest: New fractures with developing callus formation noted of the MEDIAL RIGHT clavicle and posterior LEFT 1st rib, compatible with healing subacute fractures. Other: None IMPRESSION: 1. No evidence of acute intracranial abnormality. Atrophy and chronic small-vessel white matter ischemic changes. 2. No static evidence of acute injury to the cervical spine. 3. New healing subacute fractures of the MEDIAL RIGHT clavicle and posterior LEFT 1st rib. Electronically Signed   By: Margarette Canada M.D.   On: 09/19/2020 09:53   CT Cervical Spine Wo Contrast  Result Date: 09/19/2020 CLINICAL DATA:  55 year old male with head and neck injury and possible seizure today. EXAM: CT HEAD WITHOUT CONTRAST CT CERVICAL SPINE WITHOUT CONTRAST TECHNIQUE: Multidetector CT imaging of the head and cervical spine was performed following the standard protocol without intravenous contrast. Multiplanar CT image reconstructions of the cervical spine were also generated. COMPARISON:  08/14/2020 FINDINGS: CT HEAD FINDINGS Brain: No evidence of acute infarction, hemorrhage, hydrocephalus, extra-axial collection or mass lesion/mass effect. Atrophy and mild chronic small-vessel white matter ischemic changes are again noted. Vascular: Carotid  atherosclerotic calcifications are noted. Skull: No acute abnormality. Sinuses/Orbits: No acute abnormality Other: None CT CERVICAL SPINE FINDINGS Alignment: Normal. Skull base and vertebrae: No acute fracture of the cervical spine. No primary bone lesion or focal pathologic process. Soft tissues and spinal canal: No prevertebral fluid or swelling. No visible canal hematoma. Disc levels:  No significant changes or abnormalities. Upper chest: New fractures with developing callus formation noted of the MEDIAL RIGHT clavicle and posterior LEFT 1st rib, compatible with healing subacute fractures. Other: None IMPRESSION: 1. No evidence of acute intracranial abnormality. Atrophy and chronic small-vessel white matter ischemic changes. 2. No static evidence of acute injury to the cervical spine. 3. New healing subacute fractures of the MEDIAL RIGHT clavicle and posterior LEFT 1st rib. Electronically Signed   By: Margarette Canada M.D.   On: 09/19/2020 09:53   DG ABD ACUTE 2+V W 1V CHEST  Result Date: 09/19/2020 CLINICAL DATA:  Abdominal pain, vomiting. EXAM: DG ABDOMEN ACUTE WITH  1 VIEW CHEST COMPARISON:  Jul 30, 2017. FINDINGS: There is no evidence of dilated bowel loops or free intraperitoneal air. No radiopaque calculi or other significant radiographic abnormality is seen. Heart size and mediastinal contours are within normal limits. Both lungs are clear. IMPRESSION: Negative abdominal radiographs.  No acute cardiopulmonary disease. Electronically Signed   By: Marijo Conception M.D.   On: 09/19/2020 10:11    Procedures Procedures   Medications Ordered in ED Medications - No data to display  ED Course  I have reviewed the triage vital signs and the nursing notes.  Pertinent labs & imaging results that were available during my care of the patient were reviewed by me and considered in my medical decision making (see chart for details).    MDM Rules/Calculators/A&P                          Patient well known to  the ED presents angry with someone and homicidal "I want to kill them". Also reports thinking of killing himself if he does something he'll regret.   Awake, alert. Agitated but cooperative. Will have TTS evaluate for safety of discharge and outpatient follow up.   Per TTS evaluation, he has been cleared from a psyhciatric standpoint. He is felt appropriate for discharge home.   Final Clinical Impression(s) / ED Diagnoses Final diagnoses:  None   Alcohol dependence   Rx / DC Orders ED Discharge Orders     None        Dennie Bible 09/21/20 0241    Ripley Fraise, MD 09/21/20 0251    Charlann Lange, PA-C 09/21/20 1364    Ripley Fraise, MD 09/21/20 [phone]

## 2020-09-21 NOTE — BH Assessment (Addendum)
Comprehensive Clinical Assessment (CCA) Screening, Triage and Referral Note  09/21/2020 Keneth Borg [id number] Disposition: Clinician discussed patient care with Quintella Reichert, NP.  Patient denies any current HI.  Patient feels safe being on his own.  [facility] recommended psych clearance of patient.  Clinician informed PA Charlann Lange and RN Lupita Dawn of recommendation via secure messaging.  Patient is currently denying any Hi, no plan or intention.  He says "it is not worth it" to harm someone else.  Patient says he had called the police on himself to keep himself safe.  Patient denies any SI or A/V hallucinations.  Patient had good eye contact and was not responding to internal stimuli. Patient did not evidence any delusional thought process.  He is homeless so his eating is not routine and he will drink instead of eat at times.  Pt reports poor sleep.    Chief Complaint:  Chief Complaint  Patient presents with   Suicidal   Homicidal   Visit Diagnosis: ETOH use d/o severe  Patient Reported Information How did you hear about [name]? Other (Comment) (Pt was brought in by EMS)  What Is the Reason for Your Visit/Call Today? Pt says he does not want to harm anyone else now.  "I got over that, it isn't worth it."  He denies any HI or SI at this time.  Pt denies any A/V hallucinations.  Pt says he drinks "all day every day."  Pt says he cannot afford to be on medications he says.  Patient drinks six 40's per day on average.  He is homeless and panhandles to survive.  Pt lives in a tent.  He is a familiar face in the emergency departments of [facility].  Patient feels safe being out on his own now.  When asked if he wanted any help with his dirnking he says "I can quit when I'm ready."  How Long Has This Been Causing You Problems? <Week  What Do You Feel Would Help You the Most Today? Housing Assistance; Treatment for Depression or other mood problem; Alcohol or Drug Use Treatment   Have You  Recently Had Any Thoughts About Hurting Yourself? No  Are You Planning to Commit Suicide/Harm Yourself At This time? No   Have you Recently Had Thoughts About [place]? No  Are You Planning to Harm Someone at This Time? No  Explanation: No data recorded  Have You Used Any Alcohol or Drugs in the Past 24 Hours? Yes  How Long Ago Did You Use Drugs or Alcohol? 0000 (earliet today)  What Did You Use and How Much? Pt driks about six 40's in a day.   Do You Currently Have a Therapist/Psychiatrist? No  Name of Therapist/Psychiatrist: No data recorded  Have You Been Recently Discharged From Any Office Practice or Programs? No  Explanation of Discharge From Practice/Program: No data recorded   CCA Screening Triage Referral Assessment Type of Contact: Tele-Assessment  Telemedicine Service Delivery:   Is this Initial or Reassessment? Initial Assessment  Date Telepsych consult ordered in [facility]:  09/21/20  Time Telepsych consult ordered in [facility]:  0036  Location of Assessment: [facility] ED  Provider Location: [facility] Assessment Services   Collateral Involvement: None listed   Does Patient Have a [place]? No data recorded Name and Contact of Legal Guardian: No data recorded If Minor and Not Living with Parent(s), Who has Custody? No data recorded Is CPS involved or ever been involved? -- (unknown)  Is  APS involved or ever been involved? Never   Patient Determined To Be At Risk for Harm To Self or Others Based on Review of Patient Reported Information or Presenting Complaint? No  Method: No data recorded Availability of Means: No data recorded Intent: No data recorded Notification Required: No data recorded Additional Information for Danger to Others Potential: No data recorded Additional Comments for Danger to Others Potential: No data recorded Are There Guns or Other Weapons in Your Home? No data recorded Types of Guns/Weapons: No data  recorded Are These Weapons Safely Secured?                            No data recorded Who Could Verify You Are Able To Have These Secured: No data recorded Do You Have any Outstanding Charges, Pending Court Dates, Parole/Probation? No data recorded Contacted To Inform of Risk of Harm To Self or Others: Unable to Contact:   Does Patient Present under Involuntary Commitment? No  IVC Papers Initial File Date: No data recorded  [place] of Residence: [place]   Patient Currently Receiving the Following Services: Not Receiving Services   Determination of Need: Urgent (48 hours)   Options For Referral: Other: Comment (Pt is psych cleared per Quintella Reichert, NP)   Discharge Disposition:     Raymondo Band, LCAS

## 2020-09-24 ENCOUNTER — Emergency Department (HOSPITAL_COMMUNITY)
Admission: EM | Admit: 2020-09-24 | Discharge: 2020-09-25 | Disposition: A | Discharge status: Home / Self Care | Payer: Medicaid Other | Attending: Emergency Medicine | Admitting: Emergency Medicine

## 2020-09-24 DIAGNOSIS — F10920 Alcohol use, unspecified with intoxication, uncomplicated: Secondary | ICD-10-CM | POA: Insufficient documentation

## 2020-09-24 DIAGNOSIS — Z5321 Procedure and treatment not carried out due to patient leaving prior to being seen by health care provider: Secondary | ICD-10-CM | POA: Insufficient documentation

## 2020-09-24 NOTE — ED Triage Notes (Signed)
Pt here from [place] with c/o etoh intoxication / hx of same , pt states that he may have had a seizure a few days  ago

## 2020-09-25 NOTE — ED Notes (Signed)
After coming back into the lobby from taking a pt back, a pt informed another pt to let this tech know he was leaving.

## 2020-10-05 ENCOUNTER — Emergency Department (HOSPITAL_COMMUNITY)
Admission: EM | Admit: 2020-10-05 | Discharge: 2020-10-06 | Disposition: A | Discharge status: Home / Self Care | Payer: Medicaid Other | Attending: Emergency Medicine | Admitting: Emergency Medicine

## 2020-10-05 ENCOUNTER — Other Ambulatory Visit: Payer: Self-pay

## 2020-10-05 DIAGNOSIS — I10 Essential (primary) hypertension: Secondary | ICD-10-CM | POA: Insufficient documentation

## 2020-10-05 DIAGNOSIS — Z20822 Contact with and (suspected) exposure to covid-19: Secondary | ICD-10-CM | POA: Insufficient documentation

## 2020-10-05 DIAGNOSIS — R45851 Suicidal ideations: Secondary | ICD-10-CM | POA: Insufficient documentation

## 2020-10-05 DIAGNOSIS — F17210 Nicotine dependence, cigarettes, uncomplicated: Secondary | ICD-10-CM | POA: Insufficient documentation

## 2020-10-05 DIAGNOSIS — Z28310 Unvaccinated for covid-19: Secondary | ICD-10-CM | POA: Insufficient documentation

## 2020-10-05 DIAGNOSIS — F1020 Alcohol dependence, uncomplicated: Secondary | ICD-10-CM | POA: Insufficient documentation

## 2020-10-05 DIAGNOSIS — F10920 Alcohol use, unspecified with intoxication, uncomplicated: Secondary | ICD-10-CM

## 2020-10-05 DIAGNOSIS — Y908 Blood alcohol level of 240 mg/100 ml or more: Secondary | ICD-10-CM | POA: Insufficient documentation

## 2020-10-05 LAB — CBC
HCT: 30.2 % — ABNORMAL LOW (ref 39.0–52.0)
Hemoglobin: 9 g/dL — ABNORMAL LOW (ref 13.0–17.0)
MCH: 21.2 pg — ABNORMAL LOW (ref 26.0–34.0)
MCHC: 29.8 g/dL — ABNORMAL LOW (ref 30.0–36.0)
MCV: 71.1 fL — ABNORMAL LOW (ref 80.0–100.0)
Platelets: 94 10*3/uL — ABNORMAL LOW (ref 150–400)
RBC: 4.25 MIL/uL (ref 4.22–5.81)
RDW: 20.9 % — ABNORMAL HIGH (ref 11.5–15.5)
WBC: 4.8 10*3/uL (ref 4.0–10.5)
nRBC: 0 % (ref 0.0–0.2)

## 2020-10-05 LAB — COMPREHENSIVE METABOLIC PANEL
ALT: 59 U/L — ABNORMAL HIGH (ref 0–44)
AST: 170 U/L — ABNORMAL HIGH (ref 15–41)
Albumin: 3.9 g/dL (ref 3.5–5.0)
Alkaline Phosphatase: 126 U/L (ref 38–126)
Anion gap: 11 (ref 5–15)
BUN: 6 mg/dL (ref 6–20)
CO2: 22 mmol/L (ref 22–32)
Calcium: 8.9 mg/dL (ref 8.9–10.3)
Chloride: 104 mmol/L (ref 98–111)
Creatinine, Ser: 0.66 mg/dL (ref 0.61–1.24)
GFR, Estimated: >60 mL/min (ref >60)
Glucose, Bld: 93 mg/dL (ref 70–99)
Potassium: 3.5 mmol/L (ref 3.5–5.1)
Sodium: 137 mmol/L (ref 135–145)
Total Bilirubin: 0.7 mg/dL (ref 0.3–1.2)
Total Protein: 7.7 g/dL (ref 6.5–8.1)

## 2020-10-05 LAB — RAPID URINE DRUG SCREEN, HOSP PERFORMED
Amphetamines: NOT DETECTED
Barbiturates: NOT DETECTED
Benzodiazepines: NOT DETECTED
Cocaine: NOT DETECTED
Opiates: NOT DETECTED
Tetrahydrocannabinol: NOT DETECTED

## 2020-10-05 LAB — SALICYLATE LEVEL: Salicylate Lvl: <7 mg/dL — ABNORMAL LOW (ref 7.0–30.0)

## 2020-10-05 LAB — ACETAMINOPHEN LEVEL: Acetaminophen (Tylenol), Serum: <10 ug/mL — ABNORMAL LOW (ref 10–30)

## 2020-10-05 LAB — ETHANOL: Alcohol, Ethyl (B): 340 mg/dL (ref <10)

## 2020-10-05 NOTE — ED Provider Notes (Signed)
[facility] DEPT Provider Note   CSN: [id number] Arrival date & time: 10/05/20  2101     History Chief Complaint  Patient presents with   Alcohol Intoxication   Suicidal    Barry Moore is a 55 y.o. male.  HPI He presents for evaluation of suicidal ideation with plan to jump in front of traffic.  He feels despondent due to homelessness and ongoing alcohol intoxication/dependence.  He denies recent illnesses.  He has not had COVID vaccines and does not intend to get them.  He complains of nausea and vomiting for 2 days.  He has not had any fever, cough, shortness of breath, weakness or dizziness.  No other recent illnesses.  There are no other known active modifying factors.    Past Medical History:  Diagnosis Date   Alcoholic hepatitis    Depression    ETOH abuse    Gastric bleed 08/2018   Hypertension    Pancreatitis    Seizures ([place])    Suicidal behavior     Patient Active Problem List   Diagnosis Date Noted   GIB (gastrointestinal bleeding) 06/25/2020   Suicidal ideations 05/31/2020   Nicotine dependence, cigarettes, uncomplicated 62/95/2841   Alcohol abuse with intoxication ([place]) 07/19/2019   Orthostatic syncope 07/19/2019   Upper GI bleeding 07/19/2019   Acute upper GI bleeding 07/02/2019   Duodenitis    Acute upper GI bleed 01/01/2019   Hypokalemia 01/01/2019   Alcohol use disorder, severe, dependence ([place]) 32/44/0102   Alcoholic liver disease ([place]) 01/01/2019   Acute pancreatitis 12/13/2018   Free intraperitoneal air 12/13/2018   Homicidal ideation    Gastrointestinal hemorrhage with melena 08/31/2018   Colon ulcer    Polyp of ascending colon    Acute GI bleeding 08/05/2018   Transaminitis 08/05/2018   Abdominal pain 08/05/2018   Nausea and vomiting 08/05/2018   Substance induced mood disorder ([place]) 07/16/2017   Severe episode of recurrent major depressive disorder, without psychotic features ([place]) 05/15/2017    Microcytic anemia 03/18/2017   Blood loss anemia 02/21/2017   Upper GI bleed 02/08/2017   Alcohol withdrawal ([place]) 02/08/2017   Cocaine abuse ([place]) 11/18/2016   GI bleed 11/01/2016   Alcohol abuse    Major depressive disorder, recurrent severe without psychotic features ([place]) 07/06/2016   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/15/2016   Seizure ([place]) 05/26/2016   Tobacco abuse 05/26/2016   Homeless 05/26/2016   Essential hypertension 05/26/2016   Alcohol dependence with alcohol-induced mood disorder ([place]) 02/14/2016   Severe alcohol withdrawal without perceptual disturbances without complication ([place]) 72/53/6644   Alcoholic hepatitis without ascites 02/14/2016   Thrombocytopenia ([place]) 02/14/2016    Past Surgical History:  Procedure Laterality Date   BIOPSY  08/05/2018   Procedure: BIOPSY;  Surgeon: Irving Copas., MD;  Location: [place];  Service: Gastroenterology;;   BIOPSY  08/07/2018   Procedure: BIOPSY;  Surgeon: Thornton Park, MD;  Location: [place];  Service: Gastroenterology;;   BIOPSY  01/02/2019   Procedure: BIOPSY;  Surgeon: Gatha Mayer, MD;  [facility];  Service: Endoscopy;;   COLONOSCOPY WITH PROPOFOL N/A 08/07/2018   Procedure: COLONOSCOPY WITH PROPOFOL;  Surgeon: Thornton Park, MD;  Location: [place];  Service: Gastroenterology;  Laterality: N/A;   ESOPHAGOGASTRODUODENOSCOPY N/A 01/02/2019   Procedure: ESOPHAGOGASTRODUODENOSCOPY (EGD);  Surgeon: Gatha Mayer, MD;  [facility];  Service: Endoscopy;  Laterality: N/A;   ESOPHAGOGASTRODUODENOSCOPY N/A 07/03/2019   Procedure: ESOPHAGOGASTRODUODENOSCOPY (EGD);  Surgeon: Wonda Horner, MD;  [facility];  Service: Endoscopy;  Laterality: N/A;   ESOPHAGOGASTRODUODENOSCOPY N/A 06/26/2020   Procedure: ESOPHAGOGASTRODUODENOSCOPY (EGD);  Surgeon: Arta Silence, MD;  Location[facility];  Service: Endoscopy;  Laterality: N/A;   ESOPHAGOGASTRODUODENOSCOPY (EGD)  WITH PROPOFOL N/A 11/02/2016   Procedure: ESOPHAGOGASTRODUODENOSCOPY (EGD) WITH PROPOFOL;  Surgeon: Carol Ada, MD;  [facility];  Service: Endoscopy;  Laterality: N/A;   ESOPHAGOGASTRODUODENOSCOPY (EGD) WITH PROPOFOL N/A 08/05/2018   Procedure: ESOPHAGOGASTRODUODENOSCOPY (EGD) WITH PROPOFOL;  Surgeon: Rush Landmark Telford Nab., MD;  Location: [place];  Service: Gastroenterology;  Laterality: N/A;   ESOPHAGOGASTRODUODENOSCOPY (EGD) WITH PROPOFOL N/A 07/14/2019   Procedure: ESOPHAGOGASTRODUODENOSCOPY (EGD) WITH PROPOFOL;  Surgeon: Otis Brace, MD;  [facility];  Service: Gastroenterology;  Laterality: N/A;   HERNIA REPAIR     LEG SURGERY     POLYPECTOMY  08/07/2018   Procedure: POLYPECTOMY;  Surgeon: Thornton Park, MD;  Location: [facility];  Service: Gastroenterology;;       Family History  Problem Relation Age of Onset   Diabetes Mother    Alcoholism Mother    Emphysema Father    Lung cancer Father    Alcoholism Father     Social History   Tobacco Use   Smoking status: Every Day    Packs/day: 1.00    Types: Cigarettes   Smokeless tobacco: Never  Vaping Use   Vaping Use: Never used  Substance Use Topics   Alcohol use: Yes    Comment: 5+ BEERS DAILY, Patient does not know how much he drank tonight   Drug use: Not Currently    Frequency: 3.0 times per week    Types: Cocaine    Home Medications Prior to Admission medications   Medication Sig Start Date End Date Taking? Authorizing Provider  amLODipine (NORVASC) 5 MG tablet Take 1 tablet (5 mg total) by mouth daily. Patient not taking: Reported on 08/14/2020 06/27/20 08/14/20  Charlynne Cousins, MD  ferrous sulfate 325 (65 FE) MG tablet Take 1 tablet (325 mg total) by mouth daily with breakfast. Patient not taking: Reported on 09/23/2018 08/09/18 10/09/18  Elodia Florence., MD  pantoprazole (PROTONIX) 40 MG tablet Take 1 tablet (40 mg total) by mouth 2 (two) times daily before a  meal. Patient not taking: Reported on 08/14/2020 06/27/20 08/14/20  Charlynne Cousins, MD    Allergies    Tomato, Aspirin, and Sulfa antibiotics  Review of Systems   Review of Systems  All other systems reviewed and are negative.  Physical Exam Updated Vital Signs BP (!) 146/88   Pulse 86   Temp 98.7 F (37.1 C) (Oral)   Resp 14   SpO2 100%   Physical Exam Vitals and nursing note reviewed.  Constitutional:      General: He is not in acute distress.    Appearance: He is well-developed. He is not ill-appearing, toxic-appearing or diaphoretic.     Comments: Somewhat disheveled, appearing older than stated age.  HENT:     Head: Normocephalic and atraumatic.     Right Ear: External ear normal.     Left Ear: External ear normal.  Eyes:     Conjunctiva/sclera: Conjunctivae normal.     Pupils: Pupils are equal, round, and reactive to light.  Neck:     Trachea: Phonation normal.  Cardiovascular:     Rate and Rhythm: Normal rate and regular rhythm.     Heart sounds: Normal heart sounds.  Pulmonary:     Effort: Pulmonary effort is normal.  Breath sounds: Normal breath sounds.  Abdominal:     General: There is no distension.     Palpations: Abdomen is soft.     Tenderness: There is no abdominal tenderness.  Musculoskeletal:        General: Normal range of motion.     Cervical back: Normal range of motion and neck supple.  Skin:    General: Skin is warm and dry.  Neurological:     Mental Status: He is alert and oriented to person, place, and time.     Cranial Nerves: No cranial nerve deficit.     Sensory: No sensory deficit.     Motor: No abnormal muscle tone.     Coordination: Coordination normal.  Psychiatric:        Mood and Affect: Mood normal.        Behavior: Behavior normal.        Thought Content: Thought content normal.        Judgment: Judgment normal.     Comments: He is not responding to internal stimuli.    ED Results / Procedures / Treatments    Labs (all labs ordered are listed, but only abnormal results are displayed) Labs Reviewed  COMPREHENSIVE METABOLIC PANEL - Abnormal; Notable for the following components:      Result Value   AST 170 (*)    ALT 59 (*)    All other components within normal limits  ETHANOL - Abnormal; Notable for the following components:   Alcohol, Ethyl (B) 340 (*)    All other components within normal limits  SALICYLATE LEVEL - Abnormal; Notable for the following components:   Salicylate Lvl <5.4 (*)    All other components within normal limits  ACETAMINOPHEN LEVEL - Abnormal; Notable for the following components:   Acetaminophen (Tylenol), Serum <10 (*)    All other components within normal limits  CBC - Abnormal; Notable for the following components:   Hemoglobin 9.0 (*)    HCT 30.2 (*)    MCV 71.1 (*)    MCH 21.2 (*)    MCHC 29.8 (*)    RDW 20.9 (*)    Platelets 94 (*)    All other components within normal limits  RESP PANEL BY RT-PCR (FLU A&B, COVID) ARPGX2  RAPID URINE DRUG SCREEN, HOSP PERFORMED    EKG None  Radiology No results found.  Procedures Procedures   Medications Ordered in ED Medications - No data to display  ED Course  I have reviewed the triage vital signs and the nursing notes.  Pertinent labs & imaging results that were available during my care of the patient were reviewed by me and considered in my medical decision making (see chart for details).  Clinical Course as of 10/06/20 0034  Mon Oct 06, 2020  0032 At this point the patient medically cleared for treatment by psychiatry [EW]    Clinical Course User Index [EW] Daleen Bo, MD   MDM Rules/Calculators/A&P                           Patient Vitals for the past 24 hrs:  BP Temp Temp src Pulse Resp SpO2  10/06/20 0000 (!) 146/88 -- -- 86 14 100 %  10/05/20 2330 104/70 -- -- 83 18 97 %  10/05/20 2300 121/83 -- -- 94 18 97 %  10/05/20 2230 107/71 -- -- 83 17 96 %  10/05/20 2200 123/76 -- -- 83 18  96 %  10/05/20 2130 120/80 -- -- 89 18 96 %  10/05/20 2106 (!) 141/90 98.7 F (37.1 C) Oral 92 17 98 %    12:31 AM Reevaluation with update and discussion. After initial assessment and treatment, an updated evaluation reveals no change in status. Daleen Bo   Medical Decision Making:  This patient is presenting for evaluation of alcoholism and suicidal ideation, which does require a range of treatment options, and is a complaint that involves a moderate risk of morbidity and mortality. The differential diagnoses include alcoholism, substance abuse, psychiatric stress and suicidal ideation. I decided to review old records, and in summary homeless patient who has chronic alcoholism and frequent ED evaluations, presenting for suicidal ideation.  I did not require additional historical information from anyone.  Clinical Laboratory Tests Ordered, included CBC, Metabolic panel, and UDS, alcohol level, viral panel . Review indicates normal except alcohol level elevated, hemoglobin low, AST high, ALT high.   Critical Interventions-clinical evaluation, laboratory testing, observation and reassess  After These Interventions, the Patient was reevaluated and was found to require medical clearance, and TTS evaluation.  Patient is stable.  Chronically low hemoglobin.  Hemodynamically stable.  Acutely intoxicated and will likely require period of metabolism before he can contribute to consultation with TTS  CRITICAL CARE-no Performed by: Daleen Bo  Nursing Notes Reviewed/ Care Coordinated Applicable Imaging Reviewed Interpretation of Laboratory Data incorporated into ED treatment  TTS consultation after medical clearance    Final Clinical Impression(s) / ED Diagnoses Final diagnoses:  Suicidal ideations  Alcoholism ([place])  Acute alcoholic intoxication without complication [facility])    Rx / DC Orders ED Discharge Orders     None        Daleen Bo, MD 10/06/20 [phone]

## 2020-10-05 NOTE — ED Triage Notes (Signed)
Pt was picked up off street for ETOH intox and SI. Pt states that he has drank 4-40s today, and he just wants to die. Pt states he would drink himself to death or jump in front of traffic. Pt came in via [place]

## 2020-10-06 LAB — RESP PANEL BY RT-PCR (FLU A&B, COVID) ARPGX2
Influenza A by PCR: NEGATIVE
Influenza B by PCR: NEGATIVE
SARS Coronavirus 2 by RT PCR: NEGATIVE

## 2020-10-06 NOTE — ED Notes (Signed)
Counselor reached out to this nurse about pt ability to participate in TTS consult. Pt heavily intoxicated. This nurse will assess pt status and let counselor know when pt is able to participate

## 2020-10-06 NOTE — ED Provider Notes (Addendum)
Emergency Medicine Observation Re-evaluation Note  Barry Moore is a 55 y.o. male, seen on rounds today.  Pt initially presented to the ED for complaints of Alcohol Intoxication and Suicidal Currently, the patient is sleeping.  Physical Exam  BP (!) 146/88   Pulse 86   Temp 98.7 F (37.1 C) (Oral)   Resp 14   SpO2 100%  Physical Exam General: sleeping Cardiac: normal rate Lungs: no increased WOB Psych: calm  ED Course / MDM  EKG:   I have reviewed the labs performed to date as well as medications administered while in observation.  Recent changes in the last 24 hours include none.  Plan  Current plan is for awaiting assessment. Patient is not under full IVC at this time.  8:16: pt now clinically sober.  Denies SI or HI.  Wants to leave.  No criteria for IVC   Malvin Johns, MD 10/06/20 8159    Malvin Johns, MD 10/06/20 [phone]

## 2020-10-06 NOTE — BH Assessment (Signed)
7/18 12:55 am - Per RN A Mallard, pt is too intoxicated to assess now. ETOH 340 at 22:44 (7/17). She will let us know when pt is sober and can be assessed.   Mikaele Stecher T. Mare Ferrari, [place], [facility], [facility] Triage Specialist [facility]

## 2020-10-08 ENCOUNTER — Other Ambulatory Visit: Payer: Self-pay

## 2020-10-08 ENCOUNTER — Emergency Department (HOSPITAL_COMMUNITY)
Admission: EM | Admit: 2020-10-08 | Discharge: 2020-10-09 | Disposition: A | Discharge status: Home / Self Care | Payer: Medicaid Other | Attending: Emergency Medicine | Admitting: Emergency Medicine

## 2020-10-08 DIAGNOSIS — I10 Essential (primary) hypertension: Secondary | ICD-10-CM | POA: Insufficient documentation

## 2020-10-08 DIAGNOSIS — F1010 Alcohol abuse, uncomplicated: Secondary | ICD-10-CM

## 2020-10-08 DIAGNOSIS — F17210 Nicotine dependence, cigarettes, uncomplicated: Secondary | ICD-10-CM | POA: Insufficient documentation

## 2020-10-08 DIAGNOSIS — F10129 Alcohol abuse with intoxication, unspecified: Secondary | ICD-10-CM | POA: Insufficient documentation

## 2020-10-08 DIAGNOSIS — Y908 Blood alcohol level of 240 mg/100 ml or more: Secondary | ICD-10-CM | POA: Insufficient documentation

## 2020-10-08 DIAGNOSIS — R45851 Suicidal ideations: Secondary | ICD-10-CM | POA: Insufficient documentation

## 2020-10-08 DIAGNOSIS — Z79899 Other long term (current) drug therapy: Secondary | ICD-10-CM | POA: Insufficient documentation

## 2020-10-08 DIAGNOSIS — F32A Depression, unspecified: Secondary | ICD-10-CM

## 2020-10-08 NOTE — ED Triage Notes (Signed)
To triage c/o suicidal x 3 nights. No specific plan. Calm and cooperative in triage. Appears intoxicated.

## 2020-10-09 LAB — RAPID URINE DRUG SCREEN, HOSP PERFORMED
Amphetamines: NOT DETECTED
Barbiturates: NOT DETECTED
Benzodiazepines: NOT DETECTED
Cocaine: NOT DETECTED
Opiates: NOT DETECTED
Tetrahydrocannabinol: NOT DETECTED

## 2020-10-09 LAB — CBC
HCT: 33 % — ABNORMAL LOW (ref 39.0–52.0)
Hemoglobin: 9.7 g/dL — ABNORMAL LOW (ref 13.0–17.0)
MCH: 21 pg — ABNORMAL LOW (ref 26.0–34.0)
MCHC: 29.4 g/dL — ABNORMAL LOW (ref 30.0–36.0)
MCV: 71.6 fL — ABNORMAL LOW (ref 80.0–100.0)
Platelets: 117 10*3/uL — ABNORMAL LOW (ref 150–400)
RBC: 4.61 MIL/uL (ref 4.22–5.81)
RDW: 21.5 % — ABNORMAL HIGH (ref 11.5–15.5)
WBC: 6.1 10*3/uL (ref 4.0–10.5)
nRBC: 0 % (ref 0.0–0.2)

## 2020-10-09 LAB — COMPREHENSIVE METABOLIC PANEL
ALT: 54 U/L — ABNORMAL HIGH (ref 0–44)
AST: 158 U/L — ABNORMAL HIGH (ref 15–41)
Albumin: 4.2 g/dL (ref 3.5–5.0)
Alkaline Phosphatase: 156 U/L — ABNORMAL HIGH (ref 38–126)
Anion gap: 12 (ref 5–15)
BUN: 5 mg/dL — ABNORMAL LOW (ref 6–20)
CO2: 21 mmol/L — ABNORMAL LOW (ref 22–32)
Calcium: 9.3 mg/dL (ref 8.9–10.3)
Chloride: 100 mmol/L (ref 98–111)
Creatinine, Ser: 0.86 mg/dL (ref 0.61–1.24)
GFR, Estimated: >60 mL/min (ref >60)
Glucose, Bld: 102 mg/dL — ABNORMAL HIGH (ref 70–99)
Potassium: 3.6 mmol/L (ref 3.5–5.1)
Sodium: 133 mmol/L — ABNORMAL LOW (ref 135–145)
Total Bilirubin: 1 mg/dL (ref 0.3–1.2)
Total Protein: 8.1 g/dL (ref 6.5–8.1)

## 2020-10-09 LAB — ETHANOL: Alcohol, Ethyl (B): 317 mg/dL (ref <10)

## 2020-10-09 LAB — ACETAMINOPHEN LEVEL: Acetaminophen (Tylenol), Serum: <10 ug/mL — ABNORMAL LOW (ref 10–30)

## 2020-10-09 LAB — SALICYLATE LEVEL: Salicylate Lvl: <7 mg/dL — ABNORMAL LOW (ref 7.0–30.0)

## 2020-10-09 NOTE — ED Provider Notes (Signed)
[place] EMERGENCY DEPARTMENT Provider Note   CSN: [id number] Arrival date & time: 10/08/20  2249     History Chief Complaint  Patient presents with   Suicidal    Barry Moore is a 55 y.o. male.  HPI 55 year old male who presented last night due to suicidal ideation.  He states that he got some bad news about his 1 remaining sister.  He was then drinking alcohol.  He states that the 2 things combined made him come to the hospital.  At that time he stated that he had some thoughts of harming himself although no specific plan.  He denies trying to harm himself in any way.  He is homeless and lives in a tent.  He states that this is actually a okay situation for him.  He does not feel that it is adding to his problems.  Drinks alcohol daily.     Past Medical History:  Diagnosis Date   Alcoholic hepatitis    Depression    ETOH abuse    Gastric bleed 08/2018   Hypertension    Pancreatitis    Seizures ([place])    Suicidal behavior     Patient Active Problem List   Diagnosis Date Noted   GIB (gastrointestinal bleeding) 06/25/2020   Suicidal ideations 05/31/2020   Nicotine dependence, cigarettes, uncomplicated 96/22/2979   Alcohol abuse with intoxication ([place]) 07/19/2019   Orthostatic syncope 07/19/2019   Upper GI bleeding 07/19/2019   Acute upper GI bleeding 07/02/2019   Duodenitis    Acute upper GI bleed 01/01/2019   Hypokalemia 01/01/2019   Alcohol use disorder, severe, dependence ([place]) 89/21/1941   Alcoholic liver disease ([place]) 01/01/2019   Acute pancreatitis 12/13/2018   Free intraperitoneal air 12/13/2018   Homicidal ideation    Gastrointestinal hemorrhage with melena 08/31/2018   Colon ulcer    Polyp of ascending colon    Acute GI bleeding 08/05/2018   Transaminitis 08/05/2018   Abdominal pain 08/05/2018   Nausea and vomiting 08/05/2018   Substance induced mood disorder ([place]) 07/16/2017   Severe episode of recurrent major depressive  disorder, without psychotic features ([place]) 05/15/2017   Microcytic anemia 03/18/2017   Blood loss anemia 02/21/2017   Upper GI bleed 02/08/2017   Alcohol withdrawal ([place]) 02/08/2017   Cocaine abuse ([place]) 11/18/2016   GI bleed 11/01/2016   Alcohol abuse    Major depressive disorder, recurrent severe without psychotic features ([place]) 07/06/2016   Alcohol-induced mood disorder ([place]) 06/15/2016   Seizure ([place]) 05/26/2016   Tobacco abuse 05/26/2016   Homeless 05/26/2016   Essential hypertension 05/26/2016   Alcohol dependence with alcohol-induced mood disorder ([place]) 02/14/2016   Severe alcohol withdrawal without perceptual disturbances without complication ([place]) 74/10/1446   Alcoholic hepatitis without ascites 02/14/2016   Thrombocytopenia ([place]) 02/14/2016    Past Surgical History:  Procedure Laterality Date   BIOPSY  08/05/2018   Procedure: BIOPSY;  Surgeon: Irving Copas., MD;  Location: [place];  Service: Gastroenterology;;   BIOPSY  08/07/2018   Procedure: BIOPSY;  Surgeon: Thornton Park, MD;  Location: [place];  Service: Gastroenterology;;   BIOPSY  01/02/2019   Procedure: BIOPSY;  Surgeon: Gatha Mayer, MD;  [facility];  Service: Endoscopy;;   COLONOSCOPY WITH PROPOFOL N/A 08/07/2018   Procedure: COLONOSCOPY WITH PROPOFOL;  Surgeon: Thornton Park, MD;  Location: [place];  Service: Gastroenterology;  Laterality: N/A;   ESOPHAGOGASTRODUODENOSCOPY N/A 01/02/2019   Procedure: ESOPHAGOGASTRODUODENOSCOPY (EGD);  Surgeon: Gatha Mayer, MD;  [facility];  Service: Endoscopy;  Laterality: N/A;   ESOPHAGOGASTRODUODENOSCOPY N/A 07/03/2019   Procedure: ESOPHAGOGASTRODUODENOSCOPY (EGD);  Surgeon: Wonda Horner, MD;  [facility];  Service: Endoscopy;  Laterality: N/A;   ESOPHAGOGASTRODUODENOSCOPY N/A 06/26/2020   Procedure: ESOPHAGOGASTRODUODENOSCOPY (EGD);  Surgeon: Arta Silence, MD;  [facility];  Service:  Endoscopy;  Laterality: N/A;   ESOPHAGOGASTRODUODENOSCOPY (EGD) WITH PROPOFOL N/A 11/02/2016   Procedure: ESOPHAGOGASTRODUODENOSCOPY (EGD) WITH PROPOFOL;  Surgeon: Carol Ada, MD;  [facility];  Service: Endoscopy;  Laterality: N/A;   ESOPHAGOGASTRODUODENOSCOPY (EGD) WITH PROPOFOL N/A 08/05/2018   Procedure: ESOPHAGOGASTRODUODENOSCOPY (EGD) WITH PROPOFOL;  Surgeon: Rush Landmark Telford Nab., MD;  Location: [place];  Service: Gastroenterology;  Laterality: N/A;   ESOPHAGOGASTRODUODENOSCOPY (EGD) WITH PROPOFOL N/A 07/14/2019   Procedure: ESOPHAGOGASTRODUODENOSCOPY (EGD) WITH PROPOFOL;  Surgeon: Otis Brace, MD;  [facility];  Service: Gastroenterology;  Laterality: N/A;   HERNIA REPAIR     LEG SURGERY     POLYPECTOMY  08/07/2018   Procedure: POLYPECTOMY;  Surgeon: Thornton Park, MD;  Location: [facility] ENDOSCOPY;  Service: Gastroenterology;;       Family History  Problem Relation Age of Onset   Diabetes Mother    Alcoholism Mother    Emphysema Father    Lung cancer Father    Alcoholism Father     Social History   Tobacco Use   Smoking status: Every Day    Packs/day: 1.00    Types: Cigarettes   Smokeless tobacco: Never  Vaping Use   Vaping Use: Never used  Substance Use Topics   Alcohol use: Yes    Comment: 5+ BEERS DAILY, Patient does not know how much he drank tonight   Drug use: Not Currently    Frequency: 3.0 times per week    Types: Cocaine    Home Medications Prior to Admission medications   Medication Sig Start Date End Date Taking? Authorizing Provider  amLODipine (NORVASC) 5 MG tablet Take 1 tablet (5 mg total) by mouth daily. Patient not taking: Reported on 08/14/2020 06/27/20 08/14/20  Charlynne Cousins, MD  ferrous sulfate 325 (65 FE) MG tablet Take 1 tablet (325 mg total) by mouth daily with breakfast. Patient not taking: Reported on 09/23/2018 08/09/18 10/09/18  Elodia Florence., MD  pantoprazole (PROTONIX) 40 MG tablet Take 1  tablet (40 mg total) by mouth 2 (two) times daily before a meal. Patient not taking: Reported on 08/14/2020 06/27/20 08/14/20  Charlynne Cousins, MD    Allergies    Tomato, Aspirin, and Sulfa antibiotics  Review of Systems   Review of Systems  All other systems reviewed and are negative.  Physical Exam Updated Vital Signs BP (!) 178/90 (BP Location: Left Arm)   Pulse 74   Temp 98.6 F (37 C) (Oral)   Resp 15   SpO2 99%   Physical Exam Vitals and nursing note reviewed.  Constitutional:      Appearance: Normal appearance. He is normal weight.  HENT:     Head: Normocephalic.     Right Ear: External ear normal.     Left Ear: External ear normal.     Nose: Nose normal.     Mouth/Throat:     Mouth: Mucous membranes are moist.     Pharynx: Oropharynx is clear.  Eyes:     Pupils: Pupils are equal, round, and reactive to light.  Cardiovascular:     Rate and Rhythm: Normal rate and regular rhythm.     Pulses: Normal pulses.  Pulmonary:  Effort: Pulmonary effort is normal.     Breath sounds: Normal breath sounds.  Abdominal:     General: Abdomen is flat.     Palpations: Abdomen is soft.  Musculoskeletal:        General: Normal range of motion.     Cervical back: Normal range of motion.  Skin:    General: Skin is warm and dry.     Capillary Refill: Capillary refill takes less than 2 seconds.  Neurological:     General: No focal deficit present.     Mental Status: He is alert.  Psychiatric:        Attention and Perception: Attention normal.        Mood and Affect: Mood normal.        Speech: Speech normal.        Behavior: Behavior normal.        Thought Content: Thought content is not paranoid or delusional. Thought content does not include homicidal or suicidal ideation. Thought content does not include homicidal or suicidal plan.        Cognition and Memory: Cognition normal.        Judgment: Judgment normal.    ED Results / Procedures / Treatments   Labs (all  labs ordered are listed, but only abnormal results are displayed) Labs Reviewed  COMPREHENSIVE METABOLIC PANEL - Abnormal; Notable for the following components:      Result Value   Sodium 133 (*)    CO2 21 (*)    Glucose, Bld 102 (*)    BUN 5 (*)    AST 158 (*)    ALT 54 (*)    Alkaline Phosphatase 156 (*)    All other components within normal limits  ETHANOL - Abnormal; Notable for the following components:   Alcohol, Ethyl (B) 317 (*)    All other components within normal limits  SALICYLATE LEVEL - Abnormal; Notable for the following components:   Salicylate Lvl <7.5 (*)    All other components within normal limits  ACETAMINOPHEN LEVEL - Abnormal; Notable for the following components:   Acetaminophen (Tylenol), Serum <10 (*)    All other components within normal limits  CBC - Abnormal; Notable for the following components:   Hemoglobin 9.7 (*)    HCT 33.0 (*)    MCV 71.6 (*)    MCH 21.0 (*)    MCHC 29.4 (*)    RDW 21.5 (*)    Platelets 117 (*)    All other components within normal limits  RAPID URINE DRUG SCREEN, HOSP PERFORMED    EKG None  Radiology No results found.  Procedures Procedures   Medications Ordered in ED Medications - No data to display  ED Course  I have reviewed the triage vital signs and the nursing notes.  Pertinent labs & imaging results that were available during my care of the patient were reviewed by me and considered in my medical decision making (see chart for details).    MDM Rules/Calculators/A&P                           55 year old man history of alcohol abuse, depression, presents with some suicidal thoughts last night but no plan.  Symptoms have since resolved as he has sobered.  He currently does not feel he is having any alcohol withdrawal but feels like he may withdraw soon if he does not begin drinking again.  He states that he is ready  for discharge.  Patient given information regarding outpatient follow-up for behavioral  health disorders.  We discussed his elevated liver enzymes and other lab abnormalities.  We have discussed return precautions and need for follow-up and he voices understanding. Final Clinical Impression(s) / ED Diagnoses Final diagnoses:  Alcohol abuse    Rx / DC Orders ED Discharge Orders     None        Pattricia Boss, MD 10/09/20 1216

## 2020-10-09 NOTE — Discharge Instructions (Signed)
Follow up with Behavioral Health Urgent Care for recurrent/persistent suicidal thoughts.

## 2020-10-09 NOTE — ED Provider Notes (Addendum)
[facility] EMERGENCY DEPARTMENT Provider Note   CSN: [id number] Arrival date & time: 10/08/20  2249     History Chief Complaint  Patient presents with   Suicidal    Barry Moore is a 55 y.o. male.  Patient to ED with suicidal thoughts x 3 days. Seen 7/18 and left. Returns for same.   The history is provided by the patient. No language interpreter was used.      Past Medical History:  Diagnosis Date   Alcoholic hepatitis    Depression    ETOH abuse    Gastric bleed 08/2018   Hypertension    Pancreatitis    Seizures ([place])    Suicidal behavior     Patient Active Problem List   Diagnosis Date Noted   GIB (gastrointestinal bleeding) 06/25/2020   Suicidal ideations 05/31/2020   Nicotine dependence, cigarettes, uncomplicated [id number]   Alcohol abuse with intoxication ([place]) 07/19/2019   Orthostatic syncope 07/19/2019   Upper GI bleeding 07/19/2019   Acute upper GI bleeding 07/02/2019   Duodenitis    Acute upper GI bleed 01/01/2019   Hypokalemia 01/01/2019   Alcohol use disorder, severe, dependence ([place]) [id number]   Alcoholic liver disease ([place]) 01/01/2019   Acute pancreatitis 12/13/2018   Free intraperitoneal air 12/13/2018   Homicidal ideation    Gastrointestinal hemorrhage with melena 08/31/2018   Colon ulcer    Polyp of ascending colon    Acute GI bleeding 08/05/2018   Transaminitis 08/05/2018   Abdominal pain 08/05/2018   Nausea and vomiting 08/05/2018   Substance induced mood disorder ([place]) 07/16/2017   Severe episode of recurrent major depressive disorder, without psychotic features ([place]) 05/15/2017   Microcytic anemia 03/18/2017   Blood loss anemia 02/21/2017   Upper GI bleed 02/08/2017   Alcohol withdrawal ([place]) 02/08/2017   Cocaine abuse ([place]) 11/18/2016   GI bleed 11/01/2016   Alcohol abuse    Major depressive disorder, 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   Alcohol-induced mood disorder ([place])  06/15/2016   Seizure ([place]) 05/26/2016   Tobacco abuse 05/26/2016   Homeless 05/26/2016   Essential hypertension 05/26/2016   Alcohol dependence with alcohol-induced mood disorder ([place]) 02/14/2016   Severe alcohol withdrawal without perceptual disturbances without complication ([place]) [id number]   Alcoholic hepatitis without ascites 02/14/2016   Thrombocytopenia ([place]) 02/14/2016    Past Surgical History:  Procedure Laterality Date   BIOPSY  08/05/2018   Procedure: BIOPSY;  Surgeon: Irving Copas., MD;  Location: [facility];  Service: Gastroenterology;;   BIOPSY  08/07/2018   Procedure: BIOPSY;  Surgeon: Thornton Park, MD;  Location: [facility];  Service: Gastroenterology;;   BIOPSY  01/02/2019   Procedure: BIOPSY;  Surgeon: Gatha Mayer, MD;  Location: [facility];  Service: Endoscopy;;   COLONOSCOPY WITH PROPOFOL N/A 08/07/2018   Procedure: COLONOSCOPY WITH PROPOFOL;  Surgeon: Thornton Park, MD;  Location: [facility];  Service: Gastroenterology;  Laterality: N/A;   ESOPHAGOGASTRODUODENOSCOPY N/A 01/02/2019   Procedure: ESOPHAGOGASTRODUODENOSCOPY (EGD);  Surgeon: Gatha Mayer, MD;  Location: [facility];  Service: Endoscopy;  Laterality: N/A;   ESOPHAGOGASTRODUODENOSCOPY N/A 07/03/2019   Procedure: ESOPHAGOGASTRODUODENOSCOPY (EGD);  Surgeon: Wonda Horner, MD;  Location: [facility];  Service: Endoscopy;  Laterality: N/A;   ESOPHAGOGASTRODUODENOSCOPY N/A 06/26/2020   Procedure: ESOPHAGOGASTRODUODENOSCOPY (EGD);  Surgeon: Arta Silence, MD;  Location: [facility];  Service: Endoscopy;  Laterality: N/A;   ESOPHAGOGASTRODUODENOSCOPY (EGD) WITH PROPOFOL N/A 11/02/2016   Procedure: ESOPHAGOGASTRODUODENOSCOPY (EGD) WITH PROPOFOL;  Surgeon: Carol Ada, MD;  Location: [facility];  Service: Endoscopy;  Laterality: N/A;   ESOPHAGOGASTRODUODENOSCOPY (EGD) WITH PROPOFOL N/A 08/05/2018   Procedure: ESOPHAGOGASTRODUODENOSCOPY (EGD) WITH PROPOFOL;  Surgeon: Rush Landmark  Telford Nab., MD;  Location: [facility];  Service: Gastroenterology;  Laterality: N/A;   ESOPHAGOGASTRODUODENOSCOPY (EGD) WITH PROPOFOL N/A 07/14/2019   Procedure: ESOPHAGOGASTRODUODENOSCOPY (EGD) WITH PROPOFOL;  Surgeon: Otis Brace, MD;  Location: [facility];  Service: Gastroenterology;  Laterality: N/A;   HERNIA REPAIR     LEG SURGERY     POLYPECTOMY  08/07/2018   Procedure: POLYPECTOMY;  Surgeon: Thornton Park, MD;  [facility];  Service: Gastroenterology;;       Family History  Problem Relation Age of Onset   Diabetes Mother    Alcoholism Mother    Emphysema Father    Lung cancer Father    Alcoholism Father     Social History   Tobacco Use   Smoking status: Every Barry    Packs/Barry: 1.00    Types: Cigarettes   Smokeless tobacco: Never  Vaping Use   Vaping Use: Never used  Substance Use Topics   Alcohol use: Yes    Comment: 5+ BEERS DAILY, Patient does not know how much he drank tonight   Drug use: Not Currently    Frequency: 3.0 times per week    Types: Cocaine    Home Medications Prior to Admission medications   Medication Sig Start Date End Date Taking? Authorizing Provider  amLODipine (NORVASC) 5 MG tablet Take 1 tablet (5 mg total) by mouth daily. Patient not taking: Reported on 08/14/2020 06/27/20 08/14/20  Charlynne Cousins, MD  ferrous sulfate 325 (65 FE) MG tablet Take 1 tablet (325 mg total) by mouth daily with breakfast. Patient not taking: Reported on 09/23/2018 08/09/18 10/09/18  Elodia Florence., MD  pantoprazole (PROTONIX) 40 MG tablet Take 1 tablet (40 mg total) by mouth 2 (two) times daily before a meal. Patient not taking: Reported on 08/14/2020 06/27/20 08/14/20  Charlynne Cousins, MD    Allergies    Tomato, Aspirin, and Sulfa antibiotics  Review of Systems   Review of Systems  Constitutional:  Negative for chills and fever.  HENT: Negative.    Respiratory: Negative.    Cardiovascular: Negative.   Gastrointestinal:  Negative.   Musculoskeletal: Negative.   Skin: Negative.   Neurological: Negative.   Psychiatric/Behavioral:  Positive for suicidal ideas.    Physical Exam Updated Vital Signs BP 119/75 (BP Location: Left Arm)   Pulse 99   Temp 98.6 F (37 C) (Oral)   Resp 20   SpO2 98%   Physical Exam Vitals and nursing note reviewed.  Constitutional:      Appearance: He is well-developed.  Pulmonary:     Effort: Pulmonary effort is normal.  Musculoskeletal:        General: Normal range of motion.     Cervical back: Normal range of motion.  Skin:    General: Skin is warm and dry.  Neurological:     Mental Status: He is alert and oriented to person, place, and time.    ED Results / Procedures / Treatments   Labs (all labs ordered are listed, but only abnormal results are displayed) Labs Reviewed  COMPREHENSIVE METABOLIC PANEL  ETHANOL  SALICYLATE LEVEL  ACETAMINOPHEN LEVEL  CBC  RAPID URINE DRUG SCREEN, HOSP PERFORMED    EKG None  Radiology No results found.  Procedures Procedures   Medications Ordered in ED Medications - No data to display  ED  Course  I have reviewed the triage vital signs and the nursing notes.  Pertinent labs & imaging results that were available during my care of the patient were reviewed by me and considered in my medical decision making (see chart for details).    MDM Rules/Calculators/A&P                           Patient well known to the ED here with persistent suicidal thoughts. Alert, oriented, cooperative. Seems coherent. Labs pending.  ETOH 317. Patient with chronic SI, alcoholism. Came in last night for same. Feel he can be discharged when awake and walking with outpatient resources for mental health.   Final Clinical Impression(s) / ED Diagnoses Final diagnoses:  None   Alcoholism Alcohol intoxication SI, chronic  Rx / DC Orders ED Discharge Orders     None        Charlann Lange, PA-C 10/09/20 [id number]    Charlann Lange, PA-C 10/09/20 Gay Filler, MD 10/09/20 [phone]

## 2020-10-13 ENCOUNTER — Encounter (HOSPITAL_COMMUNITY): Payer: Self-pay

## 2020-10-13 ENCOUNTER — Other Ambulatory Visit: Payer: Self-pay

## 2020-10-13 ENCOUNTER — Emergency Department (HOSPITAL_COMMUNITY)
Admission: EM | Admit: 2020-10-13 | Discharge: 2020-10-14 | Disposition: A | Discharge status: Home / Self Care | Payer: Medicaid Other | Attending: Emergency Medicine | Admitting: Emergency Medicine

## 2020-10-13 DIAGNOSIS — F10129 Alcohol abuse with intoxication, unspecified: Secondary | ICD-10-CM | POA: Insufficient documentation

## 2020-10-13 DIAGNOSIS — R45851 Suicidal ideations: Secondary | ICD-10-CM | POA: Insufficient documentation

## 2020-10-13 DIAGNOSIS — Y908 Blood alcohol level of 240 mg/100 ml or more: Secondary | ICD-10-CM | POA: Insufficient documentation

## 2020-10-13 DIAGNOSIS — Z5321 Procedure and treatment not carried out due to patient leaving prior to being seen by health care provider: Secondary | ICD-10-CM | POA: Insufficient documentation

## 2020-10-13 DIAGNOSIS — R5383 Other fatigue: Secondary | ICD-10-CM | POA: Insufficient documentation

## 2020-10-13 NOTE — ED Triage Notes (Addendum)
Pt BIB EMS, pt reports not feeling well and is fatigued. Pt has consumed 4 40 oz beers and 4 24 oz beers. Pt states that he plans on drinking himself to death.

## 2020-10-14 LAB — CBC WITH DIFFERENTIAL/PLATELET
Abs Immature Granulocytes: 0.01 10*3/uL (ref 0.00–0.07)
Basophils Absolute: 0.1 10*3/uL (ref 0.0–0.1)
Basophils Relative: 1 %
Eosinophils Absolute: 0.2 10*3/uL (ref 0.0–0.5)
Eosinophils Relative: 4 %
HCT: 33.3 % — ABNORMAL LOW (ref 39.0–52.0)
Hemoglobin: 10 g/dL — ABNORMAL LOW (ref 13.0–17.0)
Immature Granulocytes: 0 %
Lymphocytes Relative: 46 %
Lymphs Abs: 1.9 10*3/uL (ref 0.7–4.0)
MCH: 21.7 pg — ABNORMAL LOW (ref 26.0–34.0)
MCHC: 30 g/dL (ref 30.0–36.0)
MCV: 72.2 fL — ABNORMAL LOW (ref 80.0–100.0)
Monocytes Absolute: 0.6 10*3/uL (ref 0.1–1.0)
Monocytes Relative: 13 %
Neutro Abs: 1.6 10*3/uL — ABNORMAL LOW (ref 1.7–7.7)
Neutrophils Relative %: 36 %
Platelets: 159 10*3/uL (ref 150–400)
RBC: 4.61 MIL/uL (ref 4.22–5.81)
RDW: 22.9 % — ABNORMAL HIGH (ref 11.5–15.5)
WBC: 4.3 10*3/uL (ref 4.0–10.5)
nRBC: 0 % (ref 0.0–0.2)

## 2020-10-14 LAB — COMPREHENSIVE METABOLIC PANEL
ALT: 65 U/L — ABNORMAL HIGH (ref 0–44)
AST: 165 U/L — ABNORMAL HIGH (ref 15–41)
Albumin: 4.5 g/dL (ref 3.5–5.0)
Alkaline Phosphatase: 145 U/L — ABNORMAL HIGH (ref 38–126)
Anion gap: 11 (ref 5–15)
BUN: <5 mg/dL — ABNORMAL LOW (ref 6–20)
CO2: 24 mmol/L (ref 22–32)
Calcium: 8.8 mg/dL — ABNORMAL LOW (ref 8.9–10.3)
Chloride: 100 mmol/L (ref 98–111)
Creatinine, Ser: 0.7 mg/dL (ref 0.61–1.24)
GFR, Estimated: >60 mL/min (ref >60)
Glucose, Bld: 96 mg/dL (ref 70–99)
Potassium: 3.5 mmol/L (ref 3.5–5.1)
Sodium: 135 mmol/L (ref 135–145)
Total Bilirubin: 0.7 mg/dL (ref 0.3–1.2)
Total Protein: 8.9 g/dL — ABNORMAL HIGH (ref 6.5–8.1)

## 2020-10-14 LAB — ACETAMINOPHEN LEVEL: Acetaminophen (Tylenol), Serum: <10 ug/mL — ABNORMAL LOW (ref 10–30)

## 2020-10-14 LAB — ETHANOL: Alcohol, Ethyl (B): 250 mg/dL — ABNORMAL HIGH (ref <10)

## 2020-10-14 LAB — SALICYLATE LEVEL: Salicylate Lvl: <7 mg/dL — ABNORMAL LOW (ref 7.0–30.0)

## 2021-02-18 ENCOUNTER — Emergency Department: Admit: 2021-02-18 | Payer: MEDICAID

## 2021-02-18 ENCOUNTER — Inpatient Hospital Stay
Admit: 2021-02-18 | Discharge: 2021-02-19 | Disposition: A | Discharge status: Other Acute Inpatient Hospital | Payer: MEDICAID | Attending: Emergency Medicine

## 2021-02-18 DIAGNOSIS — R45851 Suicidal ideations: Secondary | ICD-10-CM

## 2021-02-18 LAB — COMPREHENSIVE METABOLIC PANEL
ALT: 22 U/L (ref 12–78)
AST: 23 U/L (ref 15–37)
Albumin/Globulin Ratio: 0.8 — ABNORMAL LOW (ref 1.1–2.2)
Albumin: 3.8 g/dL (ref 3.5–5.0)
Alkaline Phosphatase: 120 U/L — ABNORMAL HIGH (ref 45–117)
Anion Gap: 4 mmol/L — ABNORMAL LOW (ref 5–15)
BUN: 9 MG/DL (ref 6–20)
Bun/Cre Ratio: 11 — ABNORMAL LOW (ref 12–20)
CO2: 29 mmol/L (ref 21–32)
Calcium: 9 MG/DL (ref 8.5–10.1)
Chloride: 110 mmol/L — ABNORMAL HIGH (ref 97–108)
Creatinine: 0.79 MG/DL (ref 0.70–1.30)
ESTIMATED GLOMERULAR FILTRATION RATE: >60 mL/min/{1.73_m2} (ref >60)
Globulin: 4.8 g/dL — ABNORMAL HIGH (ref 2.0–4.0)
Glucose: 94 mg/dL (ref 65–100)
Potassium: 3.6 mmol/L (ref 3.5–5.1)
Sodium: 143 mmol/L (ref 136–145)
Total Bilirubin: 0.3 MG/DL (ref 0.2–1.0)
Total Protein: 8.6 g/dL — ABNORMAL HIGH (ref 6.4–8.2)

## 2021-02-18 LAB — CBC WITH AUTO DIFFERENTIAL
Basophils %: 1 % (ref 0–1)
Basophils Absolute: 0.1 10*3/uL (ref 0.0–0.1)
Eosinophils %: 2 % (ref 0–7)
Eosinophils Absolute: 0.2 10*3/uL (ref 0.0–0.4)
Granulocyte Absolute Count: 0 10*3/uL (ref 0.00–0.04)
Hematocrit: 37.3 % (ref 36.6–50.3)
Hemoglobin: 11.4 g/dL — ABNORMAL LOW (ref 12.1–17.0)
Immature Granulocytes: 0 % (ref 0.0–0.5)
Lymphocytes %: 50 % — ABNORMAL HIGH (ref 12–49)
Lymphocytes Absolute: 3.3 10*3/uL (ref 0.8–3.5)
MCH: 23.8 PG — ABNORMAL LOW (ref 26.0–34.0)
MCHC: 30.6 g/dL (ref 30.0–36.5)
MCV: 78 FL — ABNORMAL LOW (ref 80.0–99.0)
MPV: 9.9 FL (ref 8.9–12.9)
Monocytes %: 7 % (ref 5–13)
Monocytes Absolute: 0.5 10*3/uL (ref 0.0–1.0)
NRBC Absolute: 0 10*3/uL (ref 0.00–0.01)
Neutrophils %: 40 % (ref 32–75)
Neutrophils Absolute: 2.7 10*3/uL (ref 1.8–8.0)
Nucleated RBCs: 0 PER 100 WBC
Platelets: 318 10*3/uL (ref 150–400)
RBC: 4.78 M/uL (ref 4.10–5.70)
RDW: 18.1 % — ABNORMAL HIGH (ref 11.5–14.5)
WBC: 6.6 10*3/uL (ref 4.1–11.1)

## 2021-02-18 LAB — COVID-19 WITH INFLUENZA A/B
Influenza A By PCR: NOT DETECTED
Influenza A by PCR: NOT DETECTED
Influenza B By PCR: NOT DETECTED
Influenza B by PCR: NOT DETECTED
SARS-CoV-2 by PCR: NOT DETECTED
SARS-CoV-2: NOT DETECTED

## 2021-02-18 LAB — ETHYL ALCOHOL
ALCOHOL(ETHYL),SERUM: 265 MG/DL — ABNORMAL HIGH (ref <10)
Ethyl Alcohol: 265 MG/DL — ABNORMAL HIGH (ref <10)

## 2021-02-18 LAB — CBC WITH AUTOMATED DIFF
ABS. BASOPHILS: 0.1 10*3/uL (ref 0.0–0.1)
ABS. EOSINOPHILS: 0.2 10*3/uL (ref 0.0–0.4)
ABS. IMM. GRANS.: 0 10*3/uL (ref 0.00–0.04)
ABS. LYMPHOCYTES: 3.3 10*3/uL (ref 0.8–3.5)
ABS. MONOCYTES: 0.5 10*3/uL (ref 0.0–1.0)
ABS. NEUTROPHILS: 2.7 10*3/uL (ref 1.8–8.0)
ABSOLUTE NRBC: 0 10*3/uL (ref 0.00–0.01)
BASOPHILS: 1 % (ref 0–1)
EOSINOPHILS: 2 % (ref 0–7)
HCT: 37.3 % (ref 36.6–50.3)
HGB: 11.4 g/dL — ABNORMAL LOW (ref 12.1–17.0)
IMMATURE GRANULOCYTES: 0 % (ref 0.0–0.5)
LYMPHOCYTES: 50 % — ABNORMAL HIGH (ref 12–49)
MCH: 23.8 PG — ABNORMAL LOW (ref 26.0–34.0)
MCHC: 30.6 g/dL (ref 30.0–36.5)
MCV: 78 FL — ABNORMAL LOW (ref 80.0–99.0)
MONOCYTES: 7 % (ref 5–13)
MPV: 9.9 FL (ref 8.9–12.9)
NEUTROPHILS: 40 % (ref 32–75)
NRBC: 0 PER 100 WBC
PLATELET: 318 10*3/uL (ref 150–400)
RBC: 4.78 M/uL (ref 4.10–5.70)
RDW: 18.1 % — ABNORMAL HIGH (ref 11.5–14.5)
WBC: 6.6 10*3/uL (ref 4.1–11.1)

## 2021-02-18 LAB — METABOLIC PANEL, COMPREHENSIVE
A-G Ratio: 0.8 — ABNORMAL LOW (ref 1.1–2.2)
ALT (SGPT): 22 U/L (ref 12–78)
AST (SGOT): 23 U/L (ref 15–37)
Albumin: 3.8 g/dL (ref 3.5–5.0)
Alk. phosphatase: 120 U/L — ABNORMAL HIGH (ref 45–117)
Anion gap: 4 mmol/L — ABNORMAL LOW (ref 5–15)
BUN/Creatinine ratio: 11 — ABNORMAL LOW (ref 12–20)
BUN: 9 MG/DL (ref 6–20)
Bilirubin, total: 0.3 MG/DL (ref 0.2–1.0)
CO2: 29 mmol/L (ref 21–32)
Calcium: 9 MG/DL (ref 8.5–10.1)
Chloride: 110 mmol/L — ABNORMAL HIGH (ref 97–108)
Creatinine: 0.79 MG/DL (ref 0.70–1.30)
Globulin: 4.8 g/dL — ABNORMAL HIGH (ref 2.0–4.0)
Glucose: 94 mg/dL (ref 65–100)
Potassium: 3.6 mmol/L (ref 3.5–5.1)
Protein, total: 8.6 g/dL — ABNORMAL HIGH (ref 6.4–8.2)
Sodium: 143 mmol/L (ref 136–145)
eGFR: >60 mL/min/{1.73_m2} (ref >60)

## 2021-02-18 LAB — SAMPLES BEING HELD

## 2021-02-18 MED ORDER — SODIUM CHLORIDE 0.9 % IV
100 mg/mL | INTRAVENOUS | Status: AC
Start: 2021-02-18 — End: 2021-02-19
  Administered 2021-02-18: 21:00:00 via INTRAVENOUS

## 2021-02-18 MED FILL — THIAMINE 100 MG/ML INJECTION: 100 mg/mL | INTRAMUSCULAR | Qty: 1

## 2021-02-18 NOTE — ED Notes (Signed)
Pt arrives via EMS from the streets c/o SI with alcohol intoxication.  Per EMS, pt is homeless and reported drinking a 6 pack of beer prior to arrival.  Pt refuses to elaborate on SI plan.  Pt states, "I want to get my anger out."  Glucose 80

## 2021-02-18 NOTE — Unmapped (Signed)
 Formatting of this note might be different from the original.  Writer aware of [facility] consult in which discussed with attending physician. Plan is to allow pt to sober up and be medically cleared from seizures.   Electronically signed by Janit Stabs at 02/18/2021  3:10 PM EST

## 2021-02-18 NOTE — ED Provider Notes (Signed)
55 year old male presents with a chief complaint of suicidal ideation and seizure.  Patient reports that he may have had a seizure this morning.  He is not sure if he fell.  He admits to heavy drinking.  He does endorse having suicidal ideation and states that he would be either drink himself to death or use a knife which she has at home.  He endorses having right shoulder pain which is worse with movement.  He does not describe or rate the pain on a scale.  He believes that the pain started after the fall.       History reviewed. No pertinent past medical history.    History reviewed. No pertinent surgical history.      History reviewed. No pertinent family history.    Social History     Socioeconomic History    Marital status: Not on file     Spouse name: Not on file    Number of children: Not on file    Years of education: Not on file    Highest education level: Not on file   Occupational History    Not on file   Tobacco Use    Smoking status: Not on file    Smokeless tobacco: Not on file   Substance and Sexual Activity    Alcohol use: Not on file    Drug use: Not on file    Sexual activity: Not on file   Other Topics Concern    Not on file   Social History Narrative    Not on file     Social Determinants of Health     Financial Resource Strain: Not on file   Food Insecurity: Not on file   Transportation Needs: Not on file   Physical Activity: Not on file   Stress: Not on file   Social Connections: Not on file   Intimate Partner Violence: Not on file   Housing Stability: Not on file         ALLERGIES: Aspirin    Review of Systems   Constitutional:  Negative for fever.   HENT:  Negative for rhinorrhea.    Respiratory:  Negative for cough.    Cardiovascular:  Negative for chest pain.   Gastrointestinal:  Negative for abdominal pain.   Genitourinary:  Negative for dysuria.   Musculoskeletal:  Positive for arthralgias.   Skin:  Negative for wound.   Neurological:  Negative for headaches.   Psychiatric/Behavioral:   Positive for suicidal ideas. Negative for confusion.      Vitals:    02/18/21 1404   BP: 119/72   Pulse: 72   Resp: 18   Temp: 97.1 ??F (36.2 ??C)   SpO2: 96%            Physical Exam  Vitals and nursing note reviewed.   Constitutional:       General: He is not in acute distress.     Appearance: Normal appearance. He is not ill-appearing, toxic-appearing or diaphoretic.   HENT:      Head: Normocephalic.   Eyes:      Extraocular Movements: Extraocular movements intact.   Cardiovascular:      Rate and Rhythm: Normal rate.      Pulses: Normal pulses.   Pulmonary:      Effort: Pulmonary effort is normal. No respiratory distress.   Abdominal:      General: There is no distension.   Musculoskeletal:         General: Normal  range of motion.      Cervical back: Normal range of motion.      Comments: Limited range of motion of the right shoulder due to pain.   Skin:     General: Skin is dry.      Capillary Refill: Capillary refill takes less than 2 seconds.   Neurological:      Mental Status: He is alert and oriented to person, place, and time.   Psychiatric:         Thought Content: Thought content includes suicidal ideation. Thought content includes suicidal plan.        MDM  Number of Diagnoses or Management Options  Alcoholic intoxication with complication (HCC)  Suicidal ideation  Diagnosis management comments:     Patient reports with suicidal ideation and report of seizure.  CT head obtained to rule out intracranial hemorrhage or mass.  CT negative.  Labs unremarkable.  Patient evaluated by B. Smart and will be pending admission to psych facility.    Total critical care time spent exclusive of procedures: 38 minutes                 Procedures

## 2021-02-18 NOTE — Unmapped (Signed)
 Formatting of this note might be different from the original.  Next Bsmart shift made aware bed search needed and asking pt if they will be willing to be admitted outside of [facility] area.   Electronically signed by Janit Stabs at 02/18/2021  7:58 PM EST

## 2021-02-18 NOTE — Unmapped (Signed)
 Formatting of this note might be different from the original.  [employer] assessment completed, and suicide risk level noted to be HIGH. Primary Nurse Sherline and Charge Nurse Ole and Physician Dr. Charma notified. Concerns not observed.    Security/Off-duty Officer has not been notified.    Electronically signed by Janit Stabs at 02/18/2021  4:40 PM EST

## 2021-02-18 NOTE — ED Provider Notes (Signed)
 Formatting of this note is different from the original.  56 year old male presents with a chief complaint of suicidal ideation and seizure.  Patient reports that he may have had a seizure this morning.  He is not sure if he fell.  He admits to heavy drinking.  He does endorse having suicidal ideation and states that he would be either drink himself to death or use a knife which she has at home.  He endorses having right shoulder pain which is worse with movement.  He does not describe or rate the pain on a scale.  He believes that the pain started after the fall.      History reviewed. No pertinent past medical history.    History reviewed. No pertinent surgical history.    History reviewed. No pertinent family history.    Social History     Socioeconomic History    Marital status: Not on file     Spouse name: Not on file    Number of children: Not on file    Years of education: Not on file    Highest education level: Not on file   Occupational History    Not on file   Tobacco Use    Smoking status: Not on file    Smokeless tobacco: Not on file   Substance and Sexual Activity    Alcohol use: Not on file    Drug use: Not on file    Sexual activity: Not on file   Other Topics Concern    Not on file   Social History Narrative    Not on file     Social Determinants of Health     Financial Resource Strain: Not on file   Food Insecurity: Not on file   Transportation Needs: Not on file   Physical Activity: Not on file   Stress: Not on file   Social Connections: Not on file   Intimate Partner Violence: Not on file   Housing Stability: Not on file     ALLERGIES: Aspirin    Review of Systems   Constitutional:  Negative for fever.   HENT:  Negative for rhinorrhea.    Respiratory:  Negative for cough.    Cardiovascular:  Negative for chest pain.   Gastrointestinal:  Negative for abdominal pain.   Genitourinary:  Negative for dysuria.   Musculoskeletal:  Positive for arthralgias.   Skin:  Negative for wound.   Neurological:   Negative for headaches.   Psychiatric/Behavioral:  Positive for suicidal ideas. Negative for confusion.      Vitals:    02/18/21 1404   BP: 119/72   Pulse: 72   Resp: 18   Temp: 97.1 F (36.2 C)   SpO2: 96%       Physical Exam  Vitals and nursing note reviewed.   Constitutional:       General: He is not in acute distress.     Appearance: Normal appearance. He is not ill-appearing, toxic-appearing or diaphoretic.   HENT:      Head: Normocephalic.   Eyes:      Extraocular Movements: Extraocular movements intact.   Cardiovascular:      Rate and Rhythm: Normal rate.      Pulses: Normal pulses.   Pulmonary:      Effort: Pulmonary effort is normal. No respiratory distress.   Abdominal:      General: There is no distension.   Musculoskeletal:         General: Normal range of  motion.      Cervical back: Normal range of motion.      Comments: Limited range of motion of the right shoulder due to pain.   Skin:     General: Skin is dry.      Capillary Refill: Capillary refill takes less than 2 seconds.   Neurological:      Mental Status: He is alert and oriented to person, place, and time.   Psychiatric:         Thought Content: Thought content includes suicidal ideation. Thought content includes suicidal plan.       MDM  Number of Diagnoses or Management Options  Alcoholic intoxication with complication (HCC)  Suicidal ideation  Diagnosis management comments:     Patient reports with suicidal ideation and report of seizure.  CT head obtained to rule out intracranial hemorrhage or mass.  CT negative.  Labs unremarkable.  Patient evaluated by B. Smart and will be pending admission to psych facility.    Total critical care time spent exclusive of procedures: 38 minutes        Procedures          Electronically signed by Charma Charleston, MD at 02/18/2021  9:02 PM EST

## 2021-02-18 NOTE — ED Triage Notes (Signed)
 Formatting of this note might be different from the original.  Pt arrives via EMS from the streets c/o SI with alcohol intoxication.  Per EMS, pt is homeless and reported drinking a 6 pack of beer prior to arrival.  Pt refuses to elaborate on SI plan.  Pt states, I want to get my anger out.  Glucose 80  Electronically signed by Vannie Smalls, RN at 02/18/2021  2:05 PM EST

## 2021-02-18 NOTE — ED Notes (Signed)
 Formatting of this note might be different from the original.  [facility]OC contacted for sitter needed.  Electronically signed by Vannie Smalls, RN at 02/18/2021  2:06 PM EST

## 2021-02-18 NOTE — ED Notes (Signed)
[facility] contacted for sitter needed.

## 2021-02-18 NOTE — Unmapped (Signed)
 Formatting of this note might be different from the original.  [facility] not accepting pts at this time. [facility] at capacity. [facility] at capacity for male beds. Bed Search to commence. Charge/Betsey updated.    Bed Search:    [facility]- psych diversion  [facility]- call back in 1.5 hours to see if anymore discharges  [facility]- too high medical acuity  Electronically signed by Janit Stabs at 02/18/2021  6:18 PM EST

## 2021-02-18 NOTE — Unmapped (Signed)
 Formatting of this note might be different from the original.  Comprehensive Assessment Form Part 1    Section I - Disposition    Unspecified Mood Disorder (per Bradley [name])  Etoh Use Disorder, severe with intoxication    History reviewed. No pertinent past medical history.    The Medical Doctor to Psychiatrist conference was not completed.  The Medical Doctor is in agreement with Psychiatrist disposition because of (reason) Bradley [name] meeting voluntary psychiatric admission criteria.  The plan is voluntary psychiatric admission.  The on-call Psychiatrist consulted was Dr. CATHERIN.  The admitting Psychiatrist will be Dr. NELLIE.  The admitting Diagnosis is Unspecified Mood Disorder.  The Payor source is self pay.  Based on the [place] Suicide Severity Risk Level  Scale there is HIGH risk for suicide.   Based on this assessment the overall risk of suicide is High. The plan will be voluntary psychiatric admission      Section II - Integrated Summary  Summary:  Per triage, Bradley [name] arrives via EMS from the streets c/o SI with alcohol intoxication.  Per EMS, Bradley [name] is homeless and reported drinking a 6 pack of beer prior to arrival.  Bradley [name] refuses to elaborate on SI plan.  Bradley [name] states, I want to get my anger out.      Bradley [name] is a 55 year old male presenting to ER after being found by EMS with reported SI with plan to cut self and etoh intoxication (BAL 265). Bradley [name] is A&Ox4. Bradley [name] is ambulatory and can complete all ADLS and has no assistive devices. Bradley [name] presented as intermittently euthymic and cooperative. Bradley [name]'s speech somewhat slurred at beginning of conversation when he was woken up for MSE. Bradley [name] at first could not remember why he came in but as he was awake things began to clear up for him. Bradley [name] shared he has been depressed and suicidal lately. Bradley [name] reported having a mental health hx with mood disorder but he was unclear of any specifics regarding this beyond it has been 4-5 years since he has been inpatient. Bradley [name] denied HI, aggression and any pending legal issues. Bradley [name]  shared he has been having auditory hallucinations intermittently with last time being a few weeks ago. Bradley [name] shared it sounded like someone yelling at him but he cannot make out what the voice(s) are saying to him. Bradley [name] shared he is taking no medications. Bradley [name]'s appetite and sleep are both decreased. Bradley [name] shared he is drinking about 3-4 40 ounce beers daily. Bradley [name] stated he has hx of seizures, tremors and chills for withdraw symptoms. Bradley [name] smokes up to 2 packs of cigarettes daily but denied any other drug use. Bradley [name] stated his last seizure was this morning. Bradley [name] is widowed and he reported his wife died 3 months ago. When he shared this information he looked away and became restless. Bradley [name] has x 2 adult children. Bradley [name] is not employed. Bradley [name] is residing with his sister and he stated, she tries to keep me straight.     Bradley [name] shared he has been depressed and is interested in medication management. Bradley [name] is requesting inpatient admission for mood stabilization and safety.     The patienthas demonstrated mental capacity to provide informed consent.  The information is given by the patient.  The Chief Complaint is SI with plan and physical complaints (back)..  The Precipitant Factors are death of wife x3 months ago.  Previous Hospitalizations: 3-4 years ago  The patient has not previously been in restraints.  Current Psychiatrist and/or Case Manager is no one.  Lethality Assessment:    The potential for suicide noted by the following: defined plan, ideation, means, and current substance abuse .  The potential for homicide is not noted.  The patient has not been a perpetrator of sexual or physical abuse.  There are not pending charges.  The patient is felt to be at risk for self harm or harm to others.  The attending nurse was advised the patient needs supervision.    Section III - Psychosocial  The patient's overall mood and attitude is euthymic.  Feelings of helplessness and hopelessness are observed by Bradley [name] report.  Generalized anxiety is not  observed.  Panic is not observed. Phobias are not observed.  Obsessive compulsive tendencies are not observed.      Section IV - Mental Status Exam  The patient's appearance is unkempt and shows poor hygiene.  The patient's behavior shows poor eye contact. The patient is oriented to time, place, person and situation.  The patient's speech is slurred.  The patient's mood is euthymic.  The range of affect is flat.  The patient's thought content demonstrates no evidence of impairment.  The thought process shows no evidence of impairment.  The patient's perception demonstrated no changes. The patient's memory shows no evidence of impairment.  The patient's appetite is decreased and shows signs of weight loss.  The patient's sleep has evidence of insomnia. The patient shows little insight.  The patient's judgement is psychologically impaired.      Section V - Substance Abuse  The patient is using substances.  The patient is using tobacco by inhalation for greater than 10 years with last use on PTA and alcohol for greater than 10 years with last use on PTA. The patient has experienced the following withdrawal symptoms: seizures, chills, sweats, body aches, and sleep disturbance.    Section VI - Living Arrangements  The patient is widowed.  The patient lives with sister. The patient has x2 adult children. The patient does plan to return home upon discharge.  The patient does not have legal issues pending. The patient's source of income comes from family.  Religious and cultural practices have not been voiced at this time.    The patient's greatest support comes from sister, she tries to keep me straight and this person will be involved with the treatment.    The patient has not been in an event described as horrible or outside the realm of ordinary life experience either currently or in the past.  The patient has not been a victim of sexual/physical abuse.    Section VII - Other Areas of Clinical Concern  The highest  grade achieved is GED with the overall quality of school experience being described as NA.  The patient is currently unemployed and speaks [place] as a primary language.  The patient has no communication impairments affecting communication. The patient's preference for learning can be described as: can read and write adequately.  The patient's hearing is normal.  The patient's vision is normal.    Asberry Sar, MS, Resident in Counseling      Electronically signed by Sar Asberry at 02/18/2021  6:16 PM EST

## 2021-02-19 ENCOUNTER — Inpatient Hospital Stay
Admit: 2021-02-19 | Discharge: 2021-02-24 | Disposition: A | Discharge status: Home / Self Care | Payer: PRIVATE HEALTH INSURANCE | Source: Other Acute Inpatient Hospital | Attending: Addiction Medicine | Admitting: Addiction Medicine

## 2021-02-19 DIAGNOSIS — F332 Major depressive disorder, recurrent severe without psychotic features: Secondary | ICD-10-CM

## 2021-02-19 LAB — URINALYSIS WITH MICROSCOPIC
BACTERIA, URINE: NEGATIVE /hpf
Bilirubin, Urine: NEGATIVE
Blood, Urine: NEGATIVE
Glucose, Ur: NEGATIVE mg/dL
Ketones, Urine: NEGATIVE mg/dL
Leukocyte Esterase, Urine: NEGATIVE
Nitrite, Urine: NEGATIVE
Protein, UA: NEGATIVE mg/dL
Specific Gravity, UA: 1.008 (ref 1.003–1.030)
Urobilinogen, UA, POCT: 0.2 EU/dL (ref 0.2–1.0)
pH, UA: 5.5 (ref 5.0–8.0)

## 2021-02-19 LAB — DRUG SCREEN, URINE
AMPHETAMINES: NEGATIVE
Amphetamine Screen, Urine: NEGATIVE
BARBITURATES: NEGATIVE
BENZODIAZEPINES: NEGATIVE
Barbiturate Screen, Urine: NEGATIVE
Benzodiazepine Screen, Urine: NEGATIVE
COCAINE: NEGATIVE
Cocaine Screen Urine: NEGATIVE
METHADONE: NEGATIVE
Methadone Screen, Urine: NEGATIVE
OPIATES: NEGATIVE
Opiate Screen, Urine: NEGATIVE
PCP Screen, Urine: NEGATIVE
PCP(PHENCYCLIDINE): NEGATIVE
THC (TH-CANNABINOL): NEGATIVE
THC Screen, Urine: NEGATIVE

## 2021-02-19 LAB — URINALYSIS W/MICROSCOPIC
Bacteria: NEGATIVE /hpf
Bilirubin: NEGATIVE
Blood: NEGATIVE
Glucose: NEGATIVE mg/dL
Ketone: NEGATIVE mg/dL
Leukocyte Esterase: NEGATIVE
Nitrites: NEGATIVE
Protein: NEGATIVE mg/dL
Specific gravity: 1.008 (ref 1.003–1.030)
Urobilinogen: 0.2 EU/dL (ref 0.2–1.0)
pH (UA): 5.5 (ref 5.0–8.0)

## 2021-02-19 LAB — URINE CULTURE HOLD SAMPLE

## 2021-02-19 MED ORDER — ACETAMINOPHEN 325 MG TABLET
325 mg | ORAL | Status: DC | PRN
Start: 2021-02-19 — End: 2021-02-24

## 2021-02-19 MED ORDER — MAGNESIUM HYDROXIDE 400 MG/5 ML ORAL SUSP
400 mg/5 mL | Freq: Every day | ORAL | Status: AC | PRN
Start: 2021-02-19 — End: 2021-02-24

## 2021-02-19 MED ORDER — HYDROXYZINE 50 MG TAB
50 mg | Freq: Three times a day (TID) | ORAL | Status: AC | PRN
Start: 2021-02-19 — End: 2021-02-24

## 2021-02-19 MED ORDER — LORAZEPAM 2 MG/ML IJ SOLN
2 mg/mL | INTRAMUSCULAR | Status: AC | PRN
Start: 2021-02-19 — End: 2021-02-24

## 2021-02-19 MED ORDER — NICOTINE 14 MG/24 HR DAILY PATCH
1424 mg/24 hr | Freq: Every day | TRANSDERMAL | Status: DC
Start: 2021-02-19 — End: 2021-02-24

## 2021-02-19 MED ORDER — TRAZODONE 50 MG TAB
50 mg | Freq: Every evening | ORAL | Status: AC | PRN
Start: 2021-02-19 — End: 2021-02-24

## 2021-02-19 NOTE — ED Notes (Signed)
 Formatting of this note might be different from the original.  730 AM  Change of shift. Care of patient taken over from Dr. Lavella; H&P reviewed, bedside handoff complete.  Awaiting behavioral health admission. Voluntary. Medically cleared by previous providers.    Patient accepted at [facility].  Transportation set up.  I have evaluated the patient.  He is in no acute distress.  He has no needs at this time.  I asked patient to let me know if you need anything between now and when he is transported up.    Courtland E Crenshaw, DO      Electronically signed by Pietro True BRAVO, DO at 02/19/2021 11:04 AM EST

## 2021-02-19 NOTE — ED Notes (Signed)
 Formatting of this note might be different from the original.  [facility] transport arranged with ETA of 5-10 minutes.     Attempted to call report to receiving BHU at [facility]. Spoke with Rosina. RN unavailable. Awaiting return call.   Electronically signed by Faustine Elenor ORN, RN at 02/19/2021 10:51 AM EST

## 2021-02-19 NOTE — ED Notes (Signed)
[facility] transport arranged with ETA of 5-10 minutes.     Attempted to call report to receiving BHU at [facility]. Spoke with Morrie Sheldon. RN unavailable. Awaiting return call.

## 2021-02-19 NOTE — Unmapped (Signed)
 Formatting of this note might be different from the original.  CIWA score needed-nursing/Carrie advised.  Electronically signed by Janit Stabs at 02/19/2021 10:19 AM EST

## 2021-02-19 NOTE — Discharge Summary (Signed)
[facility]  DISCHARGE SUMMARY    Name:  Bradley [name], Bradley [name]  MR#:  [id number]  DOB:  1966/01/16  ACCOUNT #:  [id number]  ADMIT DATE:  02/19/2021  DISCHARGE DATE:  02/24/2021    Please make reference to my initial psychiatric H and P.    This is a 55 year old Caucasian male patient, single, admitted to behavioral health for depression and suicidal thought.    CHIEF COMPLAINT:  Depression, suicidal thought, and history of seizures and wanted to drink himself to death.    HISTORY OF PRESENT ILLNESS:  The patient with the depression and alcohol abuse, drinking alcohol 6 pack to 12-pack a day.  Lives with a sister on her property in a camper, not getting any help.  Apparently, he was also on the street wandering, drinking alcohol, wanted to kill himself by drinking himself to death.  He does have a history of depression, suicidal thought seizure disorder.  When I saw at the admission, he denied any plans to harm himself.  He wants help.  He felt his depression is 8 out of 0-10, anxiety is 10 out of 0-10.  His support was sister.  He was not sleeping, only sleeping 2 hours.  Appetite okay.  No hallucination.  No delusion.    ALLERGIES TO MEDICATIONS:  ASPIRIN - HIVES.    No surgeries were performed during this admission.    COURSE AND TREATMENT DURING THE HOSPITALIZATION:  The patient was admitted, put on a close observation.  Placed him on a detoxification regimen and antidepressant medications, individual therapy, group therapy, learning coping skills, stress management.  Placed him on also trazodone to help him sleep, Topamax 25 mg to reduce the desire to drink, help to stay sober.  With this regimen of treatment, he was smoothly detoxed.  Slept better, ate better, and learned coping skills, recognized the need for substance abuse treatment.  Not suicidal, not homicidal, not psychotic.  No withdrawal symptoms.  No weapons.    VITAL SIGNS:  BP 125/74, pulse 76, temperature 98.2, respiration 18,  SpO2 of 99% at room air.    LABORATORY DATA:  COVID-19 and influenza A and B not detected.  CBC:  Hemoglobin 11.4, MCV 78, MCH 23.8, lymphocytes 50, otherwise unremarkable.  Metabolic panel:  Chloride 110, BUN 11, alkaline phosphatase 120, protein 8.6, bilirubin 4.8.  Alcohol level was 265.  Drug screen was negative.  Urinalysis unremarkable and glucose 94.    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    DISCHARGE MEDICATIONS:  1.  Nicoderm patch 14 mg daily.  2.  Propranolol 10 mg twice a day.  3.  Topiramate 25 mg twice a day.  4.  Venlafaxine 75 mg daily.    Followup arranged by the case manager.  Discharged as improved.  Detoxed smoothly.  Free of suicidal thoughts.  Stable neurovegetative functioning, understanding about substance abuse, need for followup.  No weapons        Karilyn Cota, MD      [id number]  D:  04/11/2021 21:16  T:  04/12/2021 11:18  JOB #:  [id number]

## 2021-02-19 NOTE — Unmapped (Signed)
 Formatting of this note might be different from the original.  Pt accepted by [facility]/Dr. Brock to room 235-1. Nursing report [phone]. Brent/charge nurse updated.   Electronically signed by Janit Stabs at 02/19/2021 10:32 AM EST

## 2021-02-19 NOTE — ED Notes (Signed)
 Formatting of this note might be different from the original.  Breakfast provided to pt.   Electronically signed by Faustine Elenor ORN, RN at 02/19/2021  9:55 AM EST

## 2021-02-19 NOTE — ED Notes (Signed)
TRANSFER - OUT REPORT:    Verbal report given to Evon Slack, RN(name) on [facility]  being transferred to [facility] BHU(unit) for routine progression of care       Report consisted of patient's Situation, Background, Assessment and   Recommendations(SBAR).     Information from the following report(s) SBAR, ED Summary and Recent Results was reviewed with the receiving nurse.    Lines:   Peripheral IV 02/18/21 Left Antecubital (Active)   Site Assessment Clean, dry, & intact 02/18/21 1448   Phlebitis Assessment 0 02/18/21 1448   Infiltration Assessment 0 02/18/21 1448   Dressing Status Clean, dry, & intact 02/18/21 1448   Dressing Type Transparent 02/18/21 1448   Hub Color/Line Status Pink 02/18/21 1448   Action Taken Blood drawn 02/18/21 1448        Opportunity for questions and clarification was provided.      Patient transported with:  [facility]

## 2021-02-19 NOTE — ED Notes (Signed)
 Formatting of this note is different from the original.  TRANSFER - OUT REPORT:    Verbal report given to Rollene Sar, RN(name) on [facility]  being transferred to [facility] BHU(unit) for routine progression of care       Report consisted of patient?s Situation, Background, Assessment and   Recommendations(SBAR).     Information from the following report(s) SBAR, ED Summary and Recent Results was reviewed with the receiving nurse.    Lines:   Peripheral IV 02/18/21 Left Antecubital (Active)   Site Assessment Clean, dry, & intact 02/18/21 1448   Phlebitis Assessment 0 02/18/21 1448   Infiltration Assessment 0 02/18/21 1448   Dressing Status Clean, dry, & intact 02/18/21 1448   Dressing Type Transparent 02/18/21 1448   Hub Color/Line Status Pink 02/18/21 1448   Action Taken Blood drawn 02/18/21 1448       Opportunity for questions and clarification was provided.      Patient transported with:  [facility]      Electronically signed by Faustine Elenor ORN, RN at 02/19/2021 10:57 AM EST

## 2021-02-19 NOTE — Unmapped (Signed)
 Formatting of this note might be different from the original.  ADULT VOLUNTARY BED SEARCH STARTED/RESUMED AT 02/19/2021:    [facility], [facility], [facility], [facility], [facility] ): awaiting discharges; follow up later    [facility]: contacted at 8:45am spoke with Sari  reported at capacity    [facility] ([place], [place], [place], [place], [place], [facility]): contacted at 8:45am reported to be at capacity     [facility]: contacted at 8:45am spoke with Jon  reported at capacity    [facility]: contacted at 8:45am spoke with Deseree reported fax clinicals    [facility]: contacted at 8:45am  spoke with  Leotis who directed Clinical research associate to VM that reported fax clinicals    [facility]: contacted at 8:45am spoke with   [place] reported fax clinicals    [place]: No response     [facility]: contacted at 8:45am spoke with Raguel reported at capacity    [facility]: contacted at 8:45am spoke with Madeline reported fax clinicals    [facility]: contacted at 8:45am spoke with Wyvonna reported that admission is currently unavailable and to check back after 10:30am , due to admissions being in a meeting     [facility]: contacted at 8:45am spoke with Deward reported at capacity    [facility] ([place], [place], [place]): no response left VM requesting call back     [facility]: contacted at 8:45am  spoke with Crystal; reported at capacity    [facility]: contacted at 9:23 spoke with Kimberlee  reported exclusionarydue  etoh and recent seizure    [facility]: contacted at 9:23am spoke with Katelyn  fax clinicals    [facility]: contacted at 9:31am spoke with Elysa reported at capacity    Narvis Rouse , MSW, QMHP- A/C   Electronically signed by Rouse Narvis at 02/19/2021  9:44 AM EST

## 2021-02-19 NOTE — Behavioral Health Treatment Team (Signed)
 NURSING ADMISSION NOTE    55 y/o caucasian male, admitted to Rm 235-1, to the service of Dr. Brock, w/DX: DEPRESSION W/SI (no plan) AND ETOH ABUSE. Arrived on unit, via stretcher, from [facility]. Fully oriented.     Pt stated his goal, for this admission, is to try and figure out why I keep getting these suicidal thoughts. Denied having a plan, to self harm. Rated his depression level 8/10 and his anxiety level 10/10. Identified his sister, as his support system. Pt stated, I live alone in a camper, but I live on my sister's property.  Reported he has been to SA tx, x 2. Reported he has been experiencing decreased sleep; approx 2-3 hours QHS. Denied having a problem, with his appetite; accepted a lunch tray, & Sprite soda, after the admission process. Denied a/v hallucinations.     Pt was wearing a green hospital gown, and grey hospital socks. Cooperative with personal search by Clinical research associate and [facility] Rosina; necklace removed, and placed in safe. Calm/cooperative, during admission process. Speech clear/articulate. Followed directions w/out difficulty. Good eye to eye contact.     ALLERGIES: Aspirin.   COVID: Not detected.   FLU: Negative.   UDS:  Negative.     Oriented to unit. Q 15 mins checks initiated, for safety.

## 2021-02-19 NOTE — ED Notes (Signed)
Report provided to [facility] team.

## 2021-02-19 NOTE — Progress Notes (Signed)
Patient isolated in room all shift.  Stated during admission he has been experiencing decreased sleep;  only 2-3 hours per night.  Flat affect with disheveled appearance.  No interaction with peers or staff.  Not forthcoming during assessment.  Currently lying in bed with eyes closed, respirations even and unlabored.  Remains on every 15 minute observation for safety.            Problem: Depressed Mood (Adult/Pediatric)  Goal: *STG: Attends activities and groups  Outcome: Not Progressing Towards Goal     Problem: Chemical Dependency (Adult/Pediatric)  Goal: *STG: Remains safe in hospital  Outcome: Progressing Towards Goal     Problem: Suicide  Goal: *STG: Remains safe in hospital  Outcome: Progressing Towards Goal     Problem: Falls - Risk of  Goal: *Absence of Falls  Description: Document Schmid Fall Risk and appropriate interventions in the flowsheet.  02/19/2021 2342 by Kai Levins, RN  Outcome: Progressing Towards Goal  Note: Fall Risk Interventions:            Medication Interventions: Teach patient to arise slowly         History of Falls Interventions: Room close to nurse's station      02/19/2021 2341 by Kai Levins, RN  Outcome: Progressing Towards Goal  Note: Fall Risk Interventions:            Medication Interventions: Teach patient to arise slowly         History of Falls Interventions: Room close to nurse's station

## 2021-02-19 NOTE — ED Notes (Signed)
Pt has been wanded by security. 

## 2021-02-19 NOTE — ED Notes (Signed)
 Formatting of this note might be different from the original.  Pt's belongings retrieved from patient belonging closet and given to [facility] transport team.     [facility] transport team provided with EMTALA and ED summary for transport. Pt has been discharged and was in NAD at time of discharge.   Electronically signed by Faustine Elenor ORN, RN at 02/19/2021 11:07 AM EST

## 2021-02-19 NOTE — H&P (Signed)
[facility]  [facility] HISTORY AND PHYSICAL    Name:  Bradley [name], Bradley [name]  MR#:  [id number]  DOB:  January 08, 1966  ACCOUNT #:  [id number]  ADMIT DATE:  02/19/2021    This is a 55 year old Caucasian male patient admitted to behavioral health for depression, suicidal thoughts.    CHIEF COMPLAINT:  Depression, suicidal thoughts, and history of seizures.    HISTORY OF PRESENT ILLNESS:  Depression, wanted to drink himself to death, drinking anywhere from six-pack beer to 12-pack beer daily.  He was living with on sister's property in a camper, not getting help.  Apparently, he was on the street, wandering, drinking alcohol, wanted to kill himself, currently not getting any help.  He does have a history of depression, suicidal thoughts, and seizure disorder.  No plans.  At one time, he says, he wanted to drink himself to death.  He felt his depression is 8 out of 0 to 10 and anxiety 10 out of 0 to 10.  Patient has a supportive sister, lives alone.  He says he slept approximately only 2 hours .  Appetite okay, .  No hallucination.  No delusions .    ALLERGIES TO MEDICATIONS:  ASPIRIN.    COVID is not detected.  Flu not detected.  Drug screen negative.  Drinks 6-12 beers a day.    PAST HISTORY:  Unknown.    MENTAL STATUS EXAM:  Average height, medium-built gentleman, casually dressed, little bit untidy but polite, cooperative.  Denied hallucinations, delusions.  He wants to find out why he gets depressed, suicidal thoughts.  Memory recall is fair.  IQ about average.  Insight poor.  Judgment is poor.  Suicidal thoughts, no plans.    No cigarettes.  Excessive alcohol abuse .    DIAGNOSES:  Major depression, recurrent, acute, severe, without psychosis.  Alcohol abuse, dependence, withdrawal.  Seizure disorder, alcohol-related.    DISPOSITION:  Close observation.  Medical consult.  Place him on Nicoderm patch 14 mg, lorazepam, thiamine 100 mg daily, trazodone 50 p.r.n., Tylenol p.r.n., magnesium, Milk of Magnesia  p.r.n.  Individual therapy, group therapy, and also go ahead and place him on Topamax 25 mg twice a day and trazodone 50 mg p.r.n.    LENGTH OF STAY:  7-10 days.    CRITERIA FOR DISCHARGE:  Smooth detoxification, free of suicidal thoughts.  Stable neurovegetative functioning.  Understand the need f substance abuse and find aftercare followup.      Karilyn Cota, MD      [id number]  D:  02/19/2021 23:38  T:  02/20/2021 5:36  JOB #:  [id number]

## 2021-02-19 NOTE — Unmapped (Signed)
 Formatting of this note might be different from the original.  Pt observed sleeping in [place] bed peacefully with sitter at bedside. Plan is to await voluntary bed for placement.   Electronically signed by Viviann Foots at 02/19/2021  7:29 AM EST

## 2021-02-19 NOTE — ED Notes (Signed)
Assumed care of patient at this time. Pt changed from his clothes into a green behavioral health gown. Pt belongings placed in bag and in tote 1 in patient belonging closet. Security notified of need for wanding. Pt ambulatory to restroom to void. Sitter at bedside.

## 2021-02-19 NOTE — Unmapped (Signed)
 Formatting of this note might be different from the original.  [facility] Liaison Team Note     LOS:  19 hours      Patient goal(s) for today: take medication as prescribed, communicate needs to staff in an appropriate manner; self-reflection   [facility] Liaison team focus/goals: assess needs, provide education and support     Progress note: This Clinical research associate met with patient face to face.  Pt was received laying in bed and awake. Alert and oriented x4. Pt denied current SI/HI or [facility]. He stated, I am not feeling like I did last night. Pt believes a lot of his comments last night were induced by etoh and depression. Pt expressed hopelessness from last night but is unsure how he feels today. Patient reported his depression worsens this time of year when the holidays are approaching because most of his family is deceased. Pt was validated. He discussed and processed his relationship with his kids. This Clinical research associate asked pt what would it take for him to gain that relationship back. Pt was unsure and encouraged pt to use his time in the ED for self-reflection. He was agreeable. Liaison to continue to follow.     Barriers to Discharge: BHU placement     Outpatient provider(s):  no one  Insurance info/prescription coverage:  not on file    Diagnosis: Unspecified Mood Disorder    Plan:  please defer to psychiatric NP note for disposition and recommendation.   Follow up Psych Consult placed? yes.   Psychiatrist updated? no     Participating treatment team members: Michelina Burdock, Junior Bali, MSW, Supervisee in Social Work    Electronically signed by Bali Junior at 02/19/2021 11:13 AM EST

## 2021-02-19 NOTE — ED Notes (Signed)
Pt's sister, Steward Drone, updated on pt's transfer location per pt's request.

## 2021-02-19 NOTE — ED Notes (Signed)
 Formatting of this note might be different from the original.  Report provided to [facility] transport team.   Electronically signed by Faustine Elenor ORN, RN at 02/19/2021 11:02 AM EST

## 2021-02-19 NOTE — ED Notes (Signed)
 Formatting of this note might be different from the original.  Assumed care of patient at this time. Pt changed from his clothes into a green behavioral health gown. Pt belongings placed in bag and in tote 1 in patient belonging closet. Security notified of need for wanding. Pt ambulatory to restroom to void. Sitter at bedside.   Electronically signed by Faustine Elenor ORN, RN at 02/19/2021  8:45 AM EST

## 2021-02-19 NOTE — ED Notes (Signed)
Breakfast provided to pt.

## 2021-02-19 NOTE — ED Notes (Signed)
 Formatting of this note might be different from the original.  Pt's sister, Erminio, updated on pt's transfer location per pt's request.   Electronically signed by Faustine Elenor ORN, RN at 02/19/2021 10:58 AM EST

## 2021-02-19 NOTE — ED Notes (Signed)
 Formatting of this note might be different from the original.  Pt has been wanded by security.   Electronically signed by Faustine Elenor ORN, RN at 02/19/2021  8:54 AM EST

## 2021-02-19 NOTE — Progress Notes (Signed)
Problem: Depressed Mood (Adult/Pediatric)  Goal: *STG: Remains safe in hospital    Note: Q 15 mins checks initiated, for safety.

## 2021-02-19 NOTE — ED Notes (Signed)
730 AM  Change of shift. Care of patient taken over from Dr. Rolland Porter; H&P reviewed, bedside handoff complete.  Awaiting behavioral health admission. Voluntary. Medically cleared by previous providers.    Patient accepted at [facility].  Transportation set up.  I have evaluated the patient.  He is in no acute distress.  He has no needs at this time.  I asked patient to let me know if you need anything between now and when he is transported up.    Haani Bakula E Amiera Herzberg, DO

## 2021-02-19 NOTE — ED Notes (Signed)
Pt's belongings retrieved from patient belonging closet and given to [facility] transport team.     [facility] transport team provided with EMTALA and ED summary for transport. Pt has been discharged and was in NAD at time of discharge.

## 2021-02-20 MED ORDER — LORAZEPAM 1 MG TAB
1 mg | ORAL | Status: AC | PRN
Start: 2021-02-20 — End: 2021-02-24
  Administered 2021-02-20: 06:00:00 via ORAL

## 2021-02-20 MED ORDER — ONDANSETRON 4 MG TAB, RAPID DISSOLVE
4 mg | Freq: Four times a day (QID) | ORAL | Status: AC | PRN
Start: 2021-02-20 — End: 2021-02-24

## 2021-02-20 MED ORDER — FOLIC ACID 0.8 MG TAB
800 mcg | Freq: Every day | ORAL | Status: AC
Start: 2021-02-20 — End: 2021-02-24
  Administered 2021-02-20 – 2021-02-24 (×5): via ORAL

## 2021-02-20 MED ORDER — THIAMINE MONONITRATE 100 MG TABLET
100 mg | Freq: Every day | ORAL | Status: AC
Start: 2021-02-20 — End: 2021-02-24
  Administered 2021-02-20 – 2021-02-24 (×5): via ORAL

## 2021-02-20 MED ORDER — MULTIVITAMIN-IRON 9 MG-FOLIC ACID 400 MCG-CALCIUM & MINERALS TABLET
9-400 mg iron-400 mcg | Freq: Every day | ORAL | Status: DC
Start: 2021-02-20 — End: 2021-02-24
  Administered 2021-02-20 – 2021-02-24 (×5): via ORAL

## 2021-02-20 MED ORDER — VENLAFAXINE 37.5 MG TAB
37.5 mg | Freq: Every day | ORAL | Status: AC
Start: 2021-02-20 — End: 2021-02-21
  Administered 2021-02-20 – 2021-02-21 (×2): via ORAL

## 2021-02-20 MED ORDER — PROPRANOLOL 20 MG TAB
20 mg | Freq: Two times a day (BID) | ORAL | Status: DC
Start: 2021-02-20 — End: 2021-02-24
  Administered 2021-02-20 – 2021-02-24 (×7): via ORAL

## 2021-02-20 MED ORDER — TOPIRAMATE 25 MG TAB
25 mg | Freq: Two times a day (BID) | ORAL | Status: AC
Start: 2021-02-20 — End: 2021-02-24
  Administered 2021-02-20 – 2021-02-24 (×9): via ORAL

## 2021-02-20 MED FILL — LORAZEPAM 1 MG TAB: 1 mg | ORAL | Qty: 1

## 2021-02-20 MED FILL — THERA M PLUS (FERROUS FUMARATE) 9 MG IRON-400 MCG TABLET: 9 mg iron-400 mcg | ORAL | Qty: 1

## 2021-02-20 MED FILL — NICOTINE 14 MG/24 HR DAILY PATCH: 14 mg/24 hr | TRANSDERMAL | Qty: 1

## 2021-02-20 MED FILL — FOLIC ACID 0.8 MG TAB: 800 mcg | ORAL | Qty: 1

## 2021-02-20 MED FILL — VENLAFAXINE 37.5 MG TAB: 37.5 mg | ORAL | Qty: 1

## 2021-02-20 MED FILL — TOPIRAMATE 25 MG TAB: 25 mg | ORAL | Qty: 1

## 2021-02-20 MED FILL — THIAMINE MONONITRATE 100 MG TABLET: 100 mg | ORAL | Qty: 1

## 2021-02-20 MED FILL — PROPRANOLOL 20 MG TAB: 20 mg | ORAL | Qty: 1

## 2021-02-20 NOTE — Progress Notes (Signed)
 Patient c/o not feeling well.  States my stomach is messed up a little and I'm sweating.  It's probably just withdrawals.  CIWA score of 5.  Pleasant and accepting of prn medication.  Discussed longest length of sobriety.  He stated guess it was the 3 years I was in prison.  The first thing I wanted when I got out was a beer and a cigarette.  Discussed forced sobriety vs true desire to quit drinking for good.  Acknowledged challenges and triggers he would face.  Currently lying in bed, respirations even and unlabored.   Remains on every 15 minute close observation for safety.

## 2021-02-20 NOTE — Progress Notes (Signed)
Problem: Depressed Mood (Adult/Pediatric)  Goal: *STG: Remains safe in hospital  Outcome: Progressing Towards Goal     Problem: Depressed Mood (Adult/Pediatric)  Goal: *STG: Complies with medication therapy  Outcome: Progressing Towards Goal

## 2021-02-20 NOTE — Behavioral Health Treatment Team (Signed)
PSA PART II ADDITIONAL INFORMATION        Access To Fire Arms: No    Substance Use: YES    Last Use: 11.30.22    Type of Substance: Alcohol use    Frequency of Use: daily    Request to See Chaplain: YES    If yes, notified Chaplain: YES    Guardian:NO    Guardian Contact:Pt is his own legal guardian     Release of Information Signed: YES    Release of Information Signed For: Orvil Faraone (sister) [phone]

## 2021-02-20 NOTE — Consults (Signed)
Consult Hospitalist History and Physical    Patient: Bradley [name] MRN: [id number]  SSN: [id number]    Date of Birth: 11-Jun-1965  Age: 55 y.o.  Sex: male      Subjective:      Bradley [name] is a 55 y.o. male with no past medical history that presented to the emergency room on 02/19/2021 suicidal ideation and alcohol abuse.  He also states that he may have had a surgery the day of admission.  He admits to drinking a case of beer daily.  Smokes 1 pack of cigarettes per day.  He endorsed having suicidal ideation.  In the emergency room patient's vital signs showed elevated blood pressure.  Rapid COVID and influenza negative.  CT of the head showed no acute evidence of intracranial abnormality.  CT of the cervical spine showed no acute fracture or dislocation.  Laboratory data showed mildly low hemoglobin at 11.5.  Liver function tests unremarkable except for alkaline phosphatase elevated at 120.  Urine toxicology screen negative.  Patient was medically cleared from the emergency room and transferred to behavioral health under Dr. Cinda Quest care.  Medical team was consulted on for full history and physical.  Patient denies taking any routine medications.  He does say that he feels weak and has occasional headaches.  He says he feels anxious and nervous but no tremors.  He denies any nausea, vomiting, diarrhea, diaphoresis, abdominal pain, chest pain, shortness of breath    No past medical history on file. -Denies  No past surgical history on file. - Hernia Repair  No family history on file. - Unsure  Social History     Tobacco Use    Smoking status: Not on file    Smokeless tobacco: Not on file   Substance Use Topics    Alcohol use: Not on file      Prior to Admission medications    Not on File        Allergies   Allergen Reactions    Aspirin Hives       Review of Systems:  Constitutional: No fevers, No chills, No fatigue, + weakness  Eyes: No visual disturbance  Ears, Nose, Mouth, Throat, and Face: No nasal  congestion, No sore throat  Respiratory: No cough, No sputum, No wheezing, No SOB  Cardiovascular: No chest pain, No lower extremity edema, No Palpitations   Gastrointestinal: No nausea, No vomiting, No diarrhea, No constipation, No abdominal pain  Genitourinary: No frequency, No dysuria, No hematuria  Integument/Breast: No rash, No skin lesion(s), No dryness  Musculoskeletal: No arthralgias, No neck pain, No back pain  Neurological: + headaches, No dizziness, No confusion,  No seizures  Behavioral/Psychiatric: +anxiety, +depression      Objective:     Vitals:    02/19/21 1420 02/19/21 1930   BP: (!) 145/78 136/77   Pulse: 81 83   Resp: 18 16   Temp: 98.4 ??F (36.9 ??C) 98.8 ??F (37.1 ??C)        Physical Exam:  General: alert, cooperative, no distress  Eye: conjunctivae/corneas clear. PERRL, EOM's intact.   Throat and Neck: normal and no erythema or exudates noted. No mass   Lung: clear to auscultation bilaterally  Heart: regular rate and rhythm,   Abdomen: soft, non-tender. Bowel sounds normal. No masses,  Extremities:  able to move all extremities normal, atraumatic  Skin: Normal.  Neurologic: AOx3. Cranial nerves 2-12 and sensation grossly intact.  Psychiatric: non focal    Craniel Nerves Assessment:  CN I - Olfactory: Smell peppermint  CN II -Optic Can see finger  CN III- Occulomotor: EOM intact, Pupils react to light  CN IV- Trochlear: EOM intact, Pupils react to light  CN V- Trigeminal: Facial sensation present  CN VI- Abducens: EOM intact, Pupils react to light  CN VII- Facial: Upper: Wrinkle in Forehead, Lower: Smile symmetrical  CN VIII-Auditory: Hears fingers rubbing together  CN IX- Glossopharyngeal: Uvula raises symmetrically  CN X: Vagus- Uvula raises symmetrically  CN XI: Accessory- Shrugs shoulders symmetrically  CN XII: Hypoglossal-Can stick tongue out     CBC:   Lab Results   Component Value Date/Time    WBC 6.6 02/18/2021 02:46 PM    RBC 4.78 02/18/2021 02:46 PM    HGB 11.4 (L) 02/18/2021 02:46 PM     HCT 37.3 02/18/2021 02:46 PM    PLATELET 318 02/18/2021 02:46 PM     BMP:   Lab Results   Component Value Date/Time    Glucose 94 02/18/2021 02:46 PM    Sodium 143 02/18/2021 02:46 PM    Potassium 3.6 02/18/2021 02:46 PM    Chloride 110 (H) 02/18/2021 02:46 PM    CO2 29 02/18/2021 02:46 PM    BUN 9 02/18/2021 02:46 PM    Creatinine 0.79 02/18/2021 02:46 PM    Calcium 9.0 02/18/2021 02:46 PM     Assessment:   1.  Alcohol abuse  2.  Hypertension  3.  Depression/suicidal ideation  4.  Tobacco abuse      Plan:   1.  We will start patient on folic acid and multivitamin.  Continue with thiamine.  Zofran as needed as patient begins to detox.  Importance of hydration.  Unwitnessed seizure therefore would not treat at this time however he has a high risk due to his alcohol abuse.  CT of the head and cervical spine unremarkable.  2.  Blood pressure is elevated 141/72 with a heart rate of 85.  We will start low-dose propanolol 10 mg twice daily with holding parameters  3.  Defer psychiatric disorders to psychiatric unit.  Patient is on Topamax and Effexor  4.  Nicotine patch  5.  Patient is medically stable    Thank you Dr. Lujean Amel for the consultation and allowing [name] to participate in the care of this patient.  Please do not hesitate to call with any questions or concerns    Total Time: 62 min       Signed By: Margie Ege, PA-C     February 20, 2021

## 2021-02-20 NOTE — Behavioral Health Treatment Team (Signed)
Dx:  Major Depressive Disorder. Voluntary. Patient is visible on unit & isolative to self. Accepts medications as scheduled with no side effects reported. Asks for help filling out his menu. Isolates to room except for meals. Consumes 100%. No physical complaints. Negative CIWA. Endorsed depression 6/10. Denies SI/HI/AVH.     Encouraged to seek support from staff as needed. Will continue to monitor on close observation to ensure patient safety and follow treatment plan as written.

## 2021-02-20 NOTE — Behavioral Health Treatment Team (Signed)
Patient is resting in bed quietly, denies anxiety/depression. Denies auditory/visual hallucinations. Patient denies self harm and harm to others. Patient cooperative and accepted medication. 15 minute checks for safety.

## 2021-02-20 NOTE — Behavioral Health Treatment Team (Signed)
PSYCHOSOCIAL ASSESSMENT  :Patient identifying info:   Bradley [name] is a 55 y.o., male admitted 02/19/2021  1:12 PM     Presenting problem and precipitating factors: Pt admitted himself to the ED with SI and a plan to cut himself. Pt reported that he has been drinking and "felt bad about everything". He said that he and his sister got into an argument and that she was his only support. Pt shared that he has been in and out of jail and homeless for the last 20 years. Pts wife past ago 3 months ago but stated that they were going through a divorce before that.     Mental status assessment: Pt presented with a flat affect and "depressed" mood. Pt denied any SI/HI/AVH at the time and was oriented x4. He was pleasant to speak with well groomed. Pts thoughts were clear, organized and logical. His speech was appropriate and garbled at times. Pt has good insight on his alcohol abuse but shared that he lives in an area where he has nothing around.     Strengths/Recreation/Coping Skills:Pt likes to fish, read and listen to music    Collateral information: Nath Carter (sister) [phone]    Current psychiatric /substance abuse providers and contact info: Pt does not have a current treatment team    Previous psychiatric/substance abuse providers and response to treatment: Pt does not have past providers    Family history of mental illness or substance abuse: Pt shared that alcoholism runs in the family but was unsure about mental hx     Substance abuse history:    Social History     Tobacco Use    Smoking status: Not on file    Smokeless tobacco: Not on file   Substance Use Topics    Alcohol use: Not on file       History of biomedical complications associated with substance abuse: Pt reported hx of seizures when he goes through withdraws and the last one being the day he was admitted    Patient's current acceptance of treatment or motivation for change: Pt is motivated for treatment but is not sure about inpatient  rehab    Family constellation: Pt has two living sisters Steward Drone and Cordelia Pen    Is significant other involved? Pt is widowed    Describe support system: Pts greatest support comes from his sister    Describe living arrangements and home environment: Pt is currently living in a camper on his sister property and can return home after discharge    GUARDIAN/POA: NO    Guardian Name: Pt is his own legal guardian    Guardian Contact: n/a    Health issues:   Hospital Problems  Never Reviewed            Codes Class Noted POA    Major depression, single episode ICD-10-CM: F32.9  ICD-9-CM: 296.20  02/19/2021 Unknown           Trauma history: Pt did not report any hx of trauma    Legal issues: Pt does not have current legal issues    History of military service: Pt does not have a hx of Engineer, civil (consulting) status: Pt does not have an income    Religious/cultural factors: Pt does not affiliate with religion     Education/work history: 11th grade    Have you been licensed as a Clinical cytogeneticist (current or expired): Pt does not have a license in health care  Describe coping skills:Pt likes to be outside and would like to learn new coping skills for inside.     Janalyn Harder  02/20/2021

## 2021-02-21 MED FILL — THIAMINE MONONITRATE 100 MG TABLET: 100 mg | ORAL | Qty: 1

## 2021-02-21 MED FILL — PROPRANOLOL 20 MG TAB: 20 mg | ORAL | Qty: 1

## 2021-02-21 MED FILL — FOLIC ACID 0.8 MG TAB: 800 mcg | ORAL | Qty: 1

## 2021-02-21 MED FILL — VENLAFAXINE 37.5 MG TAB: 37.5 mg | ORAL | Qty: 1

## 2021-02-21 MED FILL — TOPIRAMATE 25 MG TAB: 25 mg | ORAL | Qty: 1

## 2021-02-21 MED FILL — NICOTINE 14 MG/24 HR DAILY PATCH: 14 mg/24 hr | TRANSDERMAL | Qty: 1

## 2021-02-21 MED FILL — THERA M PLUS (FERROUS FUMARATE) 9 MG IRON-400 MCG TABLET: 9 mg iron-400 mcg | ORAL | Qty: 1

## 2021-02-21 NOTE — Progress Notes (Signed)
Problem: Depressed Mood (Adult/Pediatric)  Goal: *STG: Attends activities and groups  Outcome: Progressing Towards Goal  Goal: *STG: Demonstrates reduction in symptoms and increase in insight into coping skills/future focused  Outcome: Progressing Towards Goal  Goal: *STG: Complies with medication therapy  Outcome: Progressing Towards Goal     Problem: Suicide  Goal: *STG: Remains safe in hospital  Outcome: Progressing Towards Goal  Pt has rested in bed most of shift. Pt denied feelings of self harm, without noted or reported behaviors. Pt out for meals and attended select group with minimal participation. Pt denied any physical sx. VSS. Pt accepted meds as ordered. Pt encouraged to seek staff as needed for any concerns.

## 2021-02-21 NOTE — Progress Notes (Signed)
Problem: Depressed Mood (Adult/Pediatric)  Goal: *STG: Demonstrates reduction in symptoms and increase in insight into coping skills/future focused  Outcome: Progressing Towards Goal  Goal: *STG: Remains safe in hospital  Outcome: Progressing Towards Goal  Goal: *STG: Complies with medication therapy  Outcome: Progressing Towards Goal     Problem: Chemical Dependency (Adult/Pediatric)  Goal: *STG: Vital signs within defined limits  Outcome: Progressing Towards Goal     BH Shift Note:    Bradley [name] was alert and oriented times 4 at the onset of the shift in his room calm and cooperative.  He was reading in his room calmly in bed.  He denied thoughts to harm himself or others and denied anxiety or depression.  His CIWA was 0 and he denied hallucinations.  He had no signs of distress or withdrawals from ETOH.  He accepted his medication without issue and is currently in bed with his eyes closed with even and unlabored breathing.  Staff will continue to monitor on every 15 minute checks.

## 2021-02-21 NOTE — Progress Notes (Signed)
Progress Note  Date:02/21/2021       Room:235/01  Patient Name:Bradley [name]     Date of Birth:1965-10-30     Age:55 y.o.      Subjective    Subjective   Review of Systems  Objective         Vitals Last 24 Hours:  TEMPERATURE:  Temp  Avg: 98.6 ??F (37 ??C)  Min: 98.3 ??F (36.8 ??C)  Max: 98.8 ??F (37.1 ??C)  RESPIRATIONS RANGE: Resp  Avg: 17  Min: 16  Max: 18  PULSE OXIMETRY RANGE: SpO2  Avg: 99 %  Min: 99 %  Max: 99 %  PULSE RANGE: Pulse  Avg: 73  Min: 69  Max: 77  BLOOD PRESSURE RANGE: Systolic (19JKD), TOI:712 , Min:124 , WPY:099   ; Diastolic (83JAS), NKN:39, Min:66, Max:75    I/O (24Hr):  No intake or output data in the 24 hours ending 02/21/21 2237  Objective  Labs/Imaging/Diagnostics    Labs:  CBC:No results for input(s): WBC, RBC, HGB, HCT, MCV, RDW, PLT, HGBEXT, HCTEXT, PLTEXT in the last 72 hours.  CHEMISTRIES:No results for input(s): NA, K, CL, CO2, BUN, CA, PHOS, MG in the last 72 hours.    No lab exists for component: CREATININE, GLUCOSEPT/INR:No results for input(s): INR, INREXT in the last 72 hours.    No lab exists for component: PROTIME  APTT:No results for input(s): APTT in the last 72 hours.  LIVER PROFILE:No results for input(s): AST, ALT in the last 72 hours.    No lab exists for component: Adrian Blackwater, ALKPHOS  Lab Results   Component Value Date/Time    ALT (SGPT) 22 02/18/2021 02:46 PM    AST (SGOT) 23 02/18/2021 02:46 PM    Alk. phosphatase 120 (H) 02/18/2021 02:46 PM    Bilirubin, total 0.3 02/18/2021 02:46 PM       Imaging Last 24 Hours:  No results found.  Assessment//Plan   Active Problems:    Major depression, single episode (02/19/2021)      Assessment & Plan patient case discussed with the nursing staff patient seen and chart reviewed patient's in bed, polite and not suicidal thought grieving over the loss of wife says energy is low tired sleeping excessively there is no sedating medications I went ahead and increase his Effexor from 37.5 mg to 75 mg vital  signs are stable he does attend groups actively participating house substance abuse interferes with him being close to family Loved onesReflecting upon approach.  Continued inpatient level of care indicated thank you    Electronically signed by Laural Benes, MD on 02/21/2021 at 10:37 PM

## 2021-02-22 MED ORDER — VENLAFAXINE 37.5 MG TAB
37.5 mg | Freq: Every day | ORAL | Status: AC
Start: 2021-02-22 — End: 2021-02-24
  Administered 2021-02-22 – 2021-02-24 (×3): via ORAL

## 2021-02-22 MED FILL — NICOTINE 14 MG/24 HR DAILY PATCH: 14 mg/24 hr | TRANSDERMAL | Qty: 1

## 2021-02-22 MED FILL — PROPRANOLOL 20 MG TAB: 20 mg | ORAL | Qty: 1

## 2021-02-22 MED FILL — VENLAFAXINE 37.5 MG TAB: 37.5 mg | ORAL | Qty: 2

## 2021-02-22 MED FILL — TOPIRAMATE 25 MG TAB: 25 mg | ORAL | Qty: 1

## 2021-02-22 MED FILL — THERA M PLUS (FERROUS FUMARATE) 9 MG IRON-400 MCG TABLET: 9 mg iron-400 mcg | ORAL | Qty: 1

## 2021-02-22 MED FILL — THIAMINE MONONITRATE 100 MG TABLET: 100 mg | ORAL | Qty: 1

## 2021-02-22 MED FILL — FOLIC ACID 0.8 MG TAB: 800 mcg | ORAL | Qty: 1

## 2021-02-22 NOTE — Progress Notes (Signed)
Problem: Depressed Mood (Adult/Pediatric)  Goal: *STG: Demonstrates reduction in symptoms and increase in insight into coping skills/future focused  Outcome: Progressing Towards Goal  Goal: *STG: Remains safe in hospital  Outcome: Progressing Towards Goal  Goal: *STG: Complies with medication therapy  Outcome: Progressing Towards Goal     Problem: Chemical Dependency (Adult/Pediatric)  Goal: *STG: Complies with medication therapy  Outcome: Progressing Towards Goal  Goal: *STG: Vital signs within defined limits  Outcome: Progressing Towards Goal     BH Shift Note:    Bradley [name] was alert and oriented times 4 at the onset of the shift in the bed reading and resting.  He presented pleasant and calm.  He denied thoughts to harm himself or others.  He stated that he was doing better.  He denied anxiety, depression or hallucinations.  He had no signs or symptoms of distress.  His inderal was held due to SBP less than 120.  He is currently in bed with his eyes closed with even and unlabored breathing.  Staff will continue to monitor on every 15 minute checks.

## 2021-02-22 NOTE — Progress Notes (Signed)
Problem: Depressed Mood (Adult/Pediatric)  Goal: *STG: Participates in treatment plan  Outcome: Progressing Towards Goal  Goal: *STG: Complies with medication therapy  Outcome: Progressing Towards Goal     Problem: Chemical Dependency (Adult/Pediatric)  Goal: *STG: Vital signs within defined limits  Outcome: Progressing Towards Goal     Problem: Suicide  Goal: *STG: Remains safe in hospital  Outcome: Progressing Towards Goal  Pt cooperative and pleasant during interaction Pt stated he has briefly talked with the CM for outpatient options which are sparse in his area. Pt stated he does not want to attend an inpatient program he has lots of things to do. Pt denied thoughts of self harm this shift. Pt encouraged to seek staff as needed.

## 2021-02-23 MED FILL — VENLAFAXINE 37.5 MG TAB: 37.5 mg | ORAL | Qty: 2

## 2021-02-23 MED FILL — TOPIRAMATE 25 MG TAB: 25 mg | ORAL | Qty: 1

## 2021-02-23 MED FILL — THERA M PLUS (FERROUS FUMARATE) 9 MG IRON-400 MCG TABLET: 9 mg iron-400 mcg | ORAL | Qty: 1

## 2021-02-23 MED FILL — PROPRANOLOL 20 MG TAB: 20 mg | ORAL | Qty: 1

## 2021-02-23 MED FILL — NICOTINE 14 MG/24 HR DAILY PATCH: 14 mg/24 hr | TRANSDERMAL | Qty: 1

## 2021-02-23 MED FILL — THIAMINE MONONITRATE 100 MG TABLET: 100 mg | ORAL | Qty: 1

## 2021-02-23 MED FILL — FOLIC ACID 0.8 MG TAB: 800 mcg | ORAL | Qty: 1

## 2021-02-23 NOTE — Progress Notes (Signed)
Problem: Depressed Mood (Adult/Pediatric)  Goal: *STG: Demonstrates reduction in symptoms and increase in insight into coping skills/future focused  Outcome: Progressing Towards Goal  Goal: *STG: Remains safe in hospital  Outcome: Progressing Towards Goal  Goal: *STG: Complies with medication therapy  Outcome: Progressing Towards Goal     BH Shift Note:    Bradley [name] was alert and oriented times 4 at the onset of the shift in his room in bed reading.  He denied thoughts to harm himself or others.  He stated that he was "trying to stay out of the drama.  Its a lot going on out there".  He presented calm and compliant with staff direction.  His propanolol was held because of low BP.  He is currently in bed with his eyes closed with even and unlabored breathing.  Staff will continue to monitor on every 15 minute checks.

## 2021-02-23 NOTE — Behavioral Health Treatment Team (Signed)
Client is pleasant and cooperative.Alert and oriented x 3.Client has a good appetite and reports sleeping well.Denies feeling suicidal or homicidal.Takes meds as prescribed and denies any side effects.Verbalizes that he is feeling better.Denies any depression or hallucinations.Remains on close observation for safety.

## 2021-02-23 NOTE — Progress Notes (Signed)
Progress Note  Date:02/23/2021       Room:235/01  Patient Name:Bradley [name]     Date of Birth:1965-04-27     Age:55 y.o.      Subjective    Subjective   Review of Systems  Objective         Vitals Last 24 Hours:  TEMPERATURE:  Temp  Avg: 98.8 ??F (37.1 ??C)  Min: 98.5 ??F (36.9 ??C)  Max: 99.1 ??F (37.3 ??C)  RESPIRATIONS RANGE: Resp  Avg: 17  Min: 16  Max: 18  PULSE OXIMETRY RANGE: SpO2  Avg: 99 %  Min: 99 %  Max: 99 %  PULSE RANGE: Pulse  Avg: 74.5  Min: 67  Max: 82  BLOOD PRESSURE RANGE: Systolic (13YQM), VHQ:469 , Min:104 , GEX:528   ; Diastolic (41LKG), MWN:02, Min:66, Max:69    I/O (24Hr):  No intake or output data in the 24 hours ending 02/23/21 2140  Objective  Labs/Imaging/Diagnostics    Labs:  CBC:No results for input(s): WBC, RBC, HGB, HCT, MCV, RDW, PLT, HGBEXT, HCTEXT, PLTEXT in the last 72 hours.  CHEMISTRIES:No results for input(s): NA, K, CL, CO2, BUN, CA, PHOS, MG in the last 72 hours.    No lab exists for component: CREATININE, GLUCOSEPT/INR:No results for input(s): INR, INREXT in the last 72 hours.    No lab exists for component: PROTIME  APTT:No results for input(s): APTT in the last 72 hours.  LIVER PROFILE:No results for input(s): AST, ALT in the last 72 hours.    No lab exists for component: Adrian Blackwater, ALKPHOS  Lab Results   Component Value Date/Time    ALT (SGPT) 22 02/18/2021 02:46 PM    AST (SGOT) 23 02/18/2021 02:46 PM    Alk. phosphatase 120 (H) 02/18/2021 02:46 PM    Bilirubin, total 0.3 02/18/2021 02:46 PM       Imaging Last 24 Hours:  No results found.  Assessment//Plan   Active Problems:    Major depression, single episode (02/19/2021)      Progress note for February 23, 2021 patient case discussed in the treatment team patient seen for follow-up chart reviewed patient compliant with medication affect flat and spends most of the time in bed no withdrawal symptoms not suicidal and not homicidal since making progress he felt that ready to go home if  family needs some time for transportation, early    And case discussed with the treatment team where the patient is services the weekend behavior and his treatment needs work on discharge plans with the family and do collaborative support services thank  Electronically signed by Laural Benes, MD on 02/23/2021 at 9:40 PM

## 2021-02-23 NOTE — Behavioral Health Treatment Team (Signed)
 Behavioral Health Treatment Team Note     Patient goal(s) for today: to go home  Treatment team focus/goals: continue medication management, group therapy and provide a safe discharge    Progress note: Pt presented with a broad affect and pretty good mood. Pt denied any SI/HI/Avh and was oriented x4. Pts thoughts were clear and goal oriented. He stated that he is going to try and stay sober for his sisters birthday and christmas. He said that he spoke with her and he will reach out to her or her boyfriend when he is feeling upset. Writer continues to connect pt with the [facility] to coordinate follow up care.     LOS:  4  Expected LOS: 12.6.22    Insurance info/prescription coverage:  MEDICAID PENDING/BSH[employer] MEDICAID PENDING  Date of last family contact:  12.5.22  Family requesting physician contact today:  no  Discharge plan:  to return home with [facility] services  Guns in the home:  no   Outpatient provider(s):  [facility]    Participating treatment team members: Michelina JULIANNA Burdock, * (assigned SW), Heinz List, LMSW

## 2021-02-23 NOTE — Progress Notes (Signed)
Progress Note  Date:02/23/2021       Room:235/01  Patient Name:Bradley [name]     Date of Birth:01-09-1966     Age:55 y.o.      Subjective    Subjective   Review of Systems  Objective         Vitals Last 24 Hours:  TEMPERATURE:  Temp  Avg: 98.8 ??F (37.1 ??C)  Min: 98.5 ??F (36.9 ??C)  Max: 99.1 ??F (37.3 ??C)  RESPIRATIONS RANGE: Resp  Avg: 17  Min: 16  Max: 18  PULSE OXIMETRY RANGE: SpO2  Avg: 99 %  Min: 99 %  Max: 99 %  PULSE RANGE: Pulse  Avg: 74.5  Min: 67  Max: 82  BLOOD PRESSURE RANGE: Systolic (16RCV), ELF:810 , Min:104 , FBP:102   ; Diastolic (58NID), POE:42, Min:66, Max:69    I/O (24Hr):  No intake or output data in the 24 hours ending 02/23/21 2136  Objective  Labs/Imaging/Diagnostics    Labs:  CBC:No results for input(s): WBC, RBC, HGB, HCT, MCV, RDW, PLT, HGBEXT, HCTEXT, PLTEXT in the last 72 hours.  CHEMISTRIES:No results for input(s): NA, K, CL, CO2, BUN, CA, PHOS, MG in the last 72 hours.    No lab exists for component: CREATININE, GLUCOSEPT/INR:No results for input(s): INR, INREXT in the last 72 hours.    No lab exists for component: PROTIME  APTT:No results for input(s): APTT in the last 72 hours.  LIVER PROFILE:No results for input(s): AST, ALT in the last 72 hours.    No lab exists for component: Adrian [place], ALKPHOS  Lab Results   Component Value Date/Time    ALT (SGPT) 22 02/18/2021 02:46 PM    AST (SGOT) 23 02/18/2021 02:46 PM    Alk. phosphatase 120 (H) 02/18/2021 02:46 PM    Bilirubin, total 0.3 02/18/2021 02:46 PM       Imaging Last 24 Hours:  No results found.  Assessment//Plan   Active Problems:    Major depression, single episode (02/19/2021)      Assessment & Plan progress note for February 22, 2021 patient seen for follow-up chart reviewed patient remains most of the time in bed alert verbal flat affect but polite and respectable no withdrawal symptoms not suicidal not psychotic not homicidal no withdrawal symptoms.  When the time comes he would like the  family to be noted bed early so that they can come by 11:00 to pick up a so far away and [place].  And continued inpatient level of care indicated vital signs are stable not suicidal he do not want to go to any substance abuse rehab    Electronically signed by Laural Benes, MD on 02/23/2021 at 9:36 PM

## 2021-02-24 MED ORDER — TOPIRAMATE 25 MG TAB
25 mg | ORAL_TABLET | Freq: Two times a day (BID) | ORAL | 0 refills | Status: AC
Start: 2021-02-24 — End: ?

## 2021-02-24 MED ORDER — PROPRANOLOL 10 MG TAB
10 mg | ORAL_TABLET | Freq: Two times a day (BID) | ORAL | 0 refills | Status: AC
Start: 2021-02-24 — End: ?

## 2021-02-24 MED ORDER — VENLAFAXINE 75 MG TAB
75 mg | ORAL_TABLET | Freq: Every day | ORAL | 0 refills | Status: AC
Start: 2021-02-24 — End: ?

## 2021-02-24 MED ORDER — NICOTINE 14 MG/24 HR DAILY PATCH
14 mg/24 hr | MEDICATED_PATCH | Freq: Every day | TRANSDERMAL | 0 refills | Status: AC
Start: 2021-02-24 — End: 2021-03-27

## 2021-02-24 MED FILL — TOPIRAMATE 25 MG TAB: 25 mg | ORAL | Qty: 1

## 2021-02-24 MED FILL — THIAMINE MONONITRATE 100 MG TABLET: 100 mg | ORAL | Qty: 1

## 2021-02-24 MED FILL — FOLIC ACID 0.8 MG TAB: 800 mcg | ORAL | Qty: 1

## 2021-02-24 MED FILL — PROPRANOLOL 20 MG TAB: 20 mg | ORAL | Qty: 1

## 2021-02-24 MED FILL — NICOTINE 14 MG/24 HR DAILY PATCH: 14 mg/24 hr | TRANSDERMAL | Qty: 1

## 2021-02-24 MED FILL — VENLAFAXINE 37.5 MG TAB: 37.5 mg | ORAL | Qty: 2

## 2021-02-24 MED FILL — THERA M PLUS (FERROUS FUMARATE) 9 MG IRON-400 MCG TABLET: 9 mg iron-400 mcg | ORAL | Qty: 1

## 2021-02-24 NOTE — Behavioral Health Treatment Team (Signed)
 Patient pleasant upon approach, affect broad, patient medication compliant.  Patient denied Anxiety, SI, HI, AH, VH and depression stating I can't be depressed in here.  Patient anticipating discharge today.  Patient remains on close observation, Q 15 minute checks.

## 2021-02-24 NOTE — Behavioral Health Treatment Team (Signed)
 Behavioral Health Transition Record to Provider    Patient Name: Bradley [name]  Date of Birth: [birth year]  Medical Record Number: [id number]  Date of Admission: 02/19/2021  Date of Discharge: 12.6.22    Attending Provider: Brock Mennie NOVAK, MD  Discharging Provider: Dr. Brock  To contact this individual call [phone] and ask the operator to page.  If unavailable, ask to be transferred to [facility] Provider on call.  A Behavioral Health Provider will be available on call 24/7 and during holidays.    Primary Care Provider: None    Allergies   Allergen Reactions    Aspirin Hives       Reason for Admission: Pt was admitted to the ED due to SI with a plan to drink himself to death or use a knife. Pt has a hx of substance use and has been homeless/in and out of prison for the last 20 years.     Admission Diagnosis: Major depression, single episode [F32.9]    * No surgery found *    Results for orders placed or performed during the hospital encounter of 02/18/21   COVID-19 WITH INFLUENZA A/B   Result Value Ref Range    SARS-CoV-2 by PCR Not detected NOTD      Influenza A by PCR Not detected NOTD      Influenza B by PCR Not detected NOTD     URINE CULTURE HOLD SAMPLE    Specimen: Urine   Result Value Ref Range    Urine culture hold        Urine on hold in Microbiology dept for 2 days.  If unpreserved urine is submitted, it cannot be used for addtional testing after 24 hours, recollection will be required.   CBC WITH AUTOMATED DIFF   Result Value Ref Range    WBC 6.6 4.1 - 11.1 K/uL    RBC 4.78 4.10 - 5.70 M/uL    HGB 11.4 (L) 12.1 - 17.0 g/dL    HCT 62.6 63.3 - 49.6 %    MCV 78.0 (L) 80.0 - 99.0 FL    MCH 23.8 (L) 26.0 - 34.0 PG    MCHC 30.6 30.0 - 36.5 g/dL    RDW 81.8 (H) 88.4 - 14.5 %    PLATELET 318 150 - 400 K/uL    MPV 9.9 8.9 - 12.9 FL    NRBC 0.0 0 PER 100 WBC    ABSOLUTE NRBC 0.00 0.00 - 0.01 K/uL    NEUTROPHILS 40 32 - 75 %    LYMPHOCYTES 50 (H) 12 - 49 %    MONOCYTES 7 5 - 13 %    EOSINOPHILS 2 0 - 7  %    BASOPHILS 1 0 - 1 %    IMMATURE GRANULOCYTES 0 0.0 - 0.5 %    ABS. NEUTROPHILS 2.7 1.8 - 8.0 K/UL    ABS. LYMPHOCYTES 3.3 0.8 - 3.5 K/UL    ABS. MONOCYTES 0.5 0.0 - 1.0 K/UL    ABS. EOSINOPHILS 0.2 0.0 - 0.4 K/UL    ABS. BASOPHILS 0.1 0.0 - 0.1 K/UL    ABS. IMM. GRANS. 0.0 0.00 - 0.04 K/UL    DF AUTOMATED     METABOLIC PANEL, COMPREHENSIVE   Result Value Ref Range    Sodium 143 136 - 145 mmol/L    Potassium 3.6 3.5 - 5.1 mmol/L    Chloride 110 (H) 97 - 108 mmol/L    CO2 29 21 - 32 mmol/L    Anion  gap 4 (L) 5 - 15 mmol/L    Glucose 94 65 - 100 mg/dL    BUN 9 6 - 20 MG/DL    Creatinine 9.20 9.29 - 1.30 MG/DL    BUN/Creatinine ratio 11 (L) 12 - 20      eGFR >60 >60 ml/min/1.36m2    Calcium 9.0 8.5 - 10.1 MG/DL    Bilirubin, total 0.3 0.2 - 1.0 MG/DL    ALT (SGPT) 22 12 - 78 U/L    AST (SGOT) 23 15 - 37 U/L    Alk. phosphatase 120 (H) 45 - 117 U/L    Protein, total 8.6 (H) 6.4 - 8.2 g/dL    Albumin 3.8 3.5 - 5.0 g/dL    Globulin 4.8 (H) 2.0 - 4.0 g/dL    A-G Ratio 0.8 (L) 1.1 - 2.2     ETHYL ALCOHOL   Result Value Ref Range    ALCOHOL(ETHYL),SERUM 265 (H) <10 MG/DL   DRUG SCREEN, URINE   Result Value Ref Range    AMPHETAMINES Negative NEG      BARBITURATES Negative NEG      BENZODIAZEPINES Negative NEG      COCAINE Negative NEG      METHADONE Negative NEG      OPIATES Negative NEG      PCP(PHENCYCLIDINE) Negative NEG      THC (TH-CANNABINOL) Negative NEG      Drug screen comment (NOTE)    SAMPLES BEING HELD   Result Value Ref Range    SAMPLES BEING HELD 1blu     COMMENT        Add-on orders for these samples will be processed based on acceptable specimen integrity and analyte stability, which may vary by analyte.   URINALYSIS W/MICROSCOPIC   Result Value Ref Range    Color YELLOW/STRAW      Appearance CLEAR CLEAR      Specific gravity 1.008 1.003 - 1.030      pH (UA) 5.5 5.0 - 8.0      Protein Negative NEG mg/dL    Glucose Negative NEG mg/dL    Ketone Negative NEG mg/dL    Bilirubin Negative NEG      Blood Negative  NEG      Urobilinogen 0.2 0.2 - 1.0 EU/dL    Nitrites Negative NEG      Leukocyte Esterase Negative NEG      WBC 0-4 0 - 4 /hpf    RBC 0-5 0 - 5 /hpf    Epithelial cells FEW FEW /lpf    Bacteria Negative NEG /hpf    Hyaline cast 0-2 0 - 5 /lpf       Immunizations administered during this encounter:   There is no immunization history on file for this patient.    Screening for Metabolic Disorders for Patients on Antipsychotic Medications  (Data obtained from the EMR)    Estimated Body Mass Index  There is no height or weight on file to calculate BMI.     Vital Signs/Blood Pressure  Visit Vitals  BP 125/74   Pulse 76   Temp 98.2 F (36.8 C)   Resp 18   SpO2 99%       Blood Glucose/Hemoglobin A1c  Lab Results   Component Value Date/Time    Glucose 94 02/18/2021 02:46 PM       No results found for: HBA1C, HBA1CEXT     Lipid Panel  No results found for: CHOL, CHOLX, CHLST, CHOLV, 884269, HDL, HDLP, LDL,  LDLC, DLDLP, TGLX, TRIGL, TRIGP, CHHD, CHHDX     Discharge Diagnosis: Major depression, single episode [F32.9]    Discharge Plan: Pt will be picked up by his sister and return home. He will be provided paper prescriptions and take the medications as directed by the doctor. Pt has an intake appointment with [facility] on 12.13.22 at 12:30 PM. Pt should arrive 15 minutes early.     Discharge Medication List and Instructions:   Current Discharge Medication List        START taking these medications    Details   nicotine (NICODERM CQ) 14 mg/24 hr patch 1 Patch by TransDERmal route daily for 30 days.  Qty: 30 Patch, Refills: 0  Start date: 02/25/2021, End date: 03/27/2021      propranoloL (INDERAL) 10 mg tablet Take 1 Tablet by mouth two (2) times a day.  Qty: 60 Tablet, Refills: 0  Start date: 02/24/2021      topiramate (TOPAMAX) 25 mg tablet Take 1 Tablet by mouth two (2) times daily (with meals).  Qty: 60 Tablet, Refills: 0  Start date: 02/24/2021      venlafaxine (EFFEXOR) 75 mg tablet Take 1 Tablet by mouth daily (with  breakfast).  Qty: 30 Tablet, Refills: 0  Start date: 02/25/2021             Unresulted Labs (24h ago, onward)      None          To obtain results of studies pending at discharge, please contact [phone]    Follow-up Information    None         Advanced Directive:   Does the patient have an appointed surrogate decision maker? No  Does the patient have a Medical Advance Directive? No  Does the patient have a Psychiatric Advance Directive? No  If the patient does not have a surrogate or Medical Advance Directive AND Psychiatric Advance Directive, the patient was offered information on these advance directives Patient will complete at a later time    Patient Instructions: Please continue all medications until otherwise directed by physician.      Tobacco Cessation Discharge Plan:   Is the patient a smoker and needs referral for smoking cessation? Yes  Patient referred to the following for smoking cessation with an appointment? Refused     Patient was offered medication to assist with smoking cessation at discharge? Not applicable  Was education for smoking cessation added to the discharge instructions? Not applicable    Alcohol/Substance Abuse Discharge Plan:   Does the patient have a history of substance/alcohol abuse and requires a referral for treatment? Yes  Patient referred to the following for substance/alcohol abuse treatment with an appointment? Yes  Patient was offered medication to assist with alcohol cessation at discharge? Not applicable  Was education for substance/alcohol abuse added to discharge instructions? Yes    Patient discharged to Home; provided to the patient/caregiver either in hard copy or electronically.

## 2021-02-24 NOTE — Behavioral Health Treatment Team (Signed)
DISCHARGE SUMMARY     NAME:Bradley [name]  DOB:   Jan 29, 1966  MRN:   [id number]     The patient Bradley [name] exhibits the ability to control behavior in a less restrictive environment.  Patient's level of functioning is improving.  No assaultive/destructive behavior has been observed for the past 24 hours.  No suicidal/homicidal threat or behavior has been observed for the past 24 hours.  There is no evidence of serious medication side effects.  Patient has not been in physical or protective restraints for at least the past 24 hours.     If weapons involved, how are they secured? N/a     Is patient aware of and in agreement with discharge plan? Yes. Pt will be returning home      Arrangements for medication:  Prescriptions will be printed and pt will take them to their preferred pharmacy     Copy of discharge instructions to provider?:  yes     Arrangements for transportation home:  pt will be picked up by his sister Steward Drone     Keep all follow up appointments as scheduled, continue to take prescribed medications per physician instructions.  Mental health crisis number:  911 or your local mental health crisis line number at (641)[phone]        Mental Health Emergency WARM LINE      1-866-400-MHAV ([phone] [phone])      M-F: 9am to 9pm      Sat & Sun: 5pm - 9pm  National suicide prevention lines:                             1-800-SUICIDE (1-607-371-0626)       1-800-273-TALK (579) 776-7878)

## 2021-02-24 NOTE — Behavioral Health Treatment Team (Signed)
Patient given personal belongings, valuables, discharge instructions and prescriptions, patient verbalized understanding, patient left unit ambulatory to the care of sister.

## 2021-07-15 ENCOUNTER — Emergency Department (HOSPITAL_COMMUNITY)
Admission: EM | Admit: 2021-07-15 | Discharge: 2021-07-16 | Disposition: A | Discharge status: Other Acute Inpatient Hospital | Payer: Medicaid - Out of State | Source: Home / Self Care | Attending: Emergency Medicine | Admitting: Emergency Medicine

## 2021-07-15 ENCOUNTER — Emergency Department (HOSPITAL_COMMUNITY): Payer: Medicaid - Out of State

## 2021-07-15 ENCOUNTER — Other Ambulatory Visit: Payer: Self-pay

## 2021-07-15 DIAGNOSIS — F1020 Alcohol dependence, uncomplicated: Secondary | ICD-10-CM

## 2021-07-15 DIAGNOSIS — R4182 Altered mental status, unspecified: Secondary | ICD-10-CM | POA: Insufficient documentation

## 2021-07-15 DIAGNOSIS — R45851 Suicidal ideations: Secondary | ICD-10-CM | POA: Insufficient documentation

## 2021-07-15 DIAGNOSIS — Z20822 Contact with and (suspected) exposure to covid-19: Secondary | ICD-10-CM | POA: Insufficient documentation

## 2021-07-15 DIAGNOSIS — F10920 Alcohol use, unspecified with intoxication, uncomplicated: Secondary | ICD-10-CM

## 2021-07-15 DIAGNOSIS — F333 Major depressive disorder, recurrent, severe with psychotic symptoms: Secondary | ICD-10-CM | POA: Insufficient documentation

## 2021-07-15 DIAGNOSIS — F10129 Alcohol abuse with intoxication, unspecified: Secondary | ICD-10-CM | POA: Insufficient documentation

## 2021-07-15 DIAGNOSIS — F1014 Alcohol abuse with alcohol-induced mood disorder: Secondary | ICD-10-CM

## 2021-07-15 LAB — CBC WITH DIFFERENTIAL/PLATELET
Abs Immature Granulocytes: 0.03 10*3/uL (ref 0.00–0.07)
Basophils Absolute: 0.1 10*3/uL (ref 0.0–0.1)
Basophils Relative: 1 %
Eosinophils Absolute: 0.1 10*3/uL (ref 0.0–0.5)
Eosinophils Relative: 1 %
HCT: 38 % — ABNORMAL LOW (ref 39.0–52.0)
Hemoglobin: 12.4 g/dL — ABNORMAL LOW (ref 13.0–17.0)
Immature Granulocytes: 0 %
Lymphocytes Relative: 30 %
Lymphs Abs: 2.7 10*3/uL (ref 0.7–4.0)
MCH: 26.7 pg (ref 26.0–34.0)
MCHC: 32.6 g/dL (ref 30.0–36.0)
MCV: 81.7 fL (ref 80.0–100.0)
Monocytes Absolute: 0.8 10*3/uL (ref 0.1–1.0)
Monocytes Relative: 9 %
Neutro Abs: 5.4 10*3/uL (ref 1.7–7.7)
Neutrophils Relative %: 59 %
Platelets: 230 10*3/uL (ref 150–400)
RBC: 4.65 MIL/uL (ref 4.22–5.81)
RDW: 17.6 % — ABNORMAL HIGH (ref 11.5–15.5)
WBC: 9.1 10*3/uL (ref 4.0–10.5)
nRBC: 0 % (ref 0.0–0.2)

## 2021-07-15 LAB — COMPREHENSIVE METABOLIC PANEL
ALT: 19 U/L (ref 0–44)
AST: 42 U/L — ABNORMAL HIGH (ref 15–41)
Albumin: 3.8 g/dL (ref 3.5–5.0)
Alkaline Phosphatase: 123 U/L (ref 38–126)
Anion gap: 13 (ref 5–15)
BUN: 7 mg/dL (ref 6–20)
CO2: 24 mmol/L (ref 22–32)
Calcium: 8.6 mg/dL — ABNORMAL LOW (ref 8.9–10.3)
Chloride: 101 mmol/L (ref 98–111)
Creatinine, Ser: 0.67 mg/dL (ref 0.61–1.24)
GFR, Estimated: >60 mL/min (ref >60)
Glucose, Bld: 79 mg/dL (ref 70–99)
Potassium: 3.6 mmol/L (ref 3.5–5.1)
Sodium: 138 mmol/L (ref 135–145)
Total Bilirubin: 0.6 mg/dL (ref 0.3–1.2)
Total Protein: 7.7 g/dL (ref 6.5–8.1)

## 2021-07-15 LAB — RAPID URINE DRUG SCREEN, HOSP PERFORMED
Amphetamines: NOT DETECTED
Barbiturates: NOT DETECTED
Benzodiazepines: NOT DETECTED
Cocaine: NOT DETECTED
Opiates: NOT DETECTED
Tetrahydrocannabinol: NOT DETECTED

## 2021-07-15 LAB — RESP PANEL BY RT-PCR (FLU A&B, COVID) ARPGX2
Influenza A by PCR: NEGATIVE
Influenza B by PCR: NEGATIVE
SARS Coronavirus 2 by RT PCR: NEGATIVE

## 2021-07-15 LAB — ETHANOL: Alcohol, Ethyl (B): 415 mg/dL (ref <10)

## 2021-07-15 LAB — URINALYSIS, ROUTINE W REFLEX MICROSCOPIC
Bilirubin Urine: NEGATIVE
Glucose, UA: NEGATIVE mg/dL
Hgb urine dipstick: NEGATIVE
Ketones, ur: NEGATIVE mg/dL
Leukocytes,Ua: NEGATIVE
Nitrite: NEGATIVE
Protein, ur: NEGATIVE mg/dL
Specific Gravity, Urine: 1.011 (ref 1.005–1.030)
pH: 6 (ref 5.0–8.0)

## 2021-07-15 LAB — LACTIC ACID, PLASMA
Lactic Acid, Venous: 2 mmol/L (ref 0.5–1.9)
Lactic Acid, Venous: 1.6 mmol/L (ref 0.5–1.9)

## 2021-07-15 MED ORDER — ACETAMINOPHEN 650 MG RE SUPP
650.0000 mg | Freq: Once | RECTAL | Status: DC
  Filled 2021-07-15: qty 1

## 2021-07-15 MED ORDER — LORAZEPAM 1 MG PO TABS
1.0000 mg | ORAL_TABLET | ORAL | Status: DC | PRN
Start: 2021-07-15 — End: 2021-07-16
  Administered 2021-07-15: 2 mg via ORAL
  Administered 2021-07-15: 3 mg via ORAL
  Filled 2021-07-15: qty 2
  Filled 2021-07-15: qty 3

## 2021-07-15 MED ORDER — ACETAMINOPHEN 500 MG PO TABS
1000.0000 mg | ORAL_TABLET | Freq: Once | ORAL | Status: AC
  Administered 2021-07-15: 1000 mg via ORAL
  Filled 2021-07-15: qty 2

## 2021-07-15 MED ORDER — THIAMINE HCL 100 MG PO TABS
100.0000 mg | ORAL_TABLET | Freq: Every day | ORAL | Status: DC
  Administered 2021-07-15: 100 mg via ORAL
  Filled 2021-07-15: qty 1

## 2021-07-15 MED ORDER — FOLIC ACID 1 MG PO TABS
1.0000 mg | ORAL_TABLET | Freq: Every day | ORAL | Status: DC
  Administered 2021-07-15: 1 mg via ORAL
  Filled 2021-07-15: qty 1

## 2021-07-15 MED ORDER — SODIUM CHLORIDE 0.9 % IV BOLUS
1000.0000 mL | Freq: Once | INTRAVENOUS | Status: AC
  Administered 2021-07-15: 1000 mL via INTRAVENOUS

## 2021-07-15 MED ORDER — ADULT MULTIVITAMIN W/MINERALS CH
1.0000 | ORAL_TABLET | Freq: Every day | ORAL | Status: DC
  Administered 2021-07-15: 1 via ORAL
  Filled 2021-07-15: qty 1

## 2021-07-15 NOTE — ED Notes (Signed)
Pt states he is close to his sister and her "old man" will not allow her to talk to him anymore and he was forced to leave. Pt states he is either going to kill himself or the sisters spouse.  ?

## 2021-07-15 NOTE — ED Provider Notes (Signed)
?[facility] DEPT ?Provider Note ? ? ?CSN: [id number] ?Arrival date & time: 07/15/21  1330 ? ?  ? ?History ? ?Chief Complaint  ?Patient presents with  ? Suicidal  ? Alcohol Intoxication  ? ? ?Barry Moore is a 56 y.o. male. ? ?Patient with a history of alcohol abuse.  Patient states he wants to kill himself and also kill his sister's husband.  He is brought in by the police ? ?The history is provided by the patient and medical records. No language interpreter was used.  ?Alcohol Intoxication ?This is a chronic problem. The problem occurs constantly. The problem has not changed since onset.Pertinent negatives include no chest pain, no abdominal pain and no headaches. Nothing aggravates the symptoms. Nothing relieves the symptoms. He has tried nothing for the symptoms. The treatment provided no relief.  ? ?  ? ?Home Medications ?Prior to Admission medications   ?Medication Sig Start Date End Date Taking? Authorizing Provider  ?amLODipine (NORVASC) 5 MG tablet Take 1 tablet (5 mg total) by mouth daily. ?Patient not taking: Reported on 08/14/2020 06/27/20 08/14/20  Charlynne Cousins, MD  ?ferrous sulfate 325 (65 FE) MG tablet Take 1 tablet (325 mg total) by mouth daily with breakfast. ?Patient not taking: Reported on 09/23/2018 08/09/18 10/09/18  Elodia Florence., MD  ?pantoprazole (PROTONIX) 40 MG tablet Take 1 tablet (40 mg total) by mouth 2 (two) times daily before a meal. ?Patient not taking: Reported on 08/14/2020 06/27/20 08/14/20  Charlynne Cousins, MD  ?   ? ?Allergies    ?Tomato, Aspirin, and Sulfa antibiotics   ? ?Review of Systems   ?Review of Systems  ?Constitutional:  Negative for appetite change and fatigue.  ?HENT:  Negative for congestion, ear discharge and sinus pressure.   ?Eyes:  Negative for discharge.  ?Respiratory:  Negative for cough.   ?Cardiovascular:  Negative for chest pain.  ?Gastrointestinal:  Negative for abdominal pain and diarrhea.  ?Genitourinary:  Negative  for frequency and hematuria.  ?Musculoskeletal:  Negative for back pain.  ?Skin:  Negative for rash.  ?Neurological:  Negative for seizures and headaches.  ?Psychiatric/Behavioral:  Positive for dysphoric mood. Negative for hallucinations.   ? ?Physical Exam ?Updated Vital Signs ?BP 135/65   Pulse 97   Temp 97.9 ?F (36.6 ?C)   Resp 16   SpO2 97%  ?Physical Exam ?Vitals and nursing note reviewed.  ?Constitutional:   ?   Appearance: He is well-developed.  ?HENT:  ?   Head: Normocephalic.  ?   Nose: Nose normal.  ?Eyes:  ?   General: No scleral icterus. ?   Conjunctiva/sclera: Conjunctivae normal.  ?Neck:  ?   Thyroid: No thyromegaly.  ?Cardiovascular:  ?   Rate and Rhythm: Normal rate and regular rhythm.  ?   Heart sounds: No murmur heard. ?  No friction rub. No gallop.  ?Pulmonary:  ?   Breath sounds: No stridor. No wheezing or rales.  ?Chest:  ?   Chest wall: No tenderness.  ?Abdominal:  ?   General: There is no distension.  ?   Tenderness: There is no abdominal tenderness. There is no rebound.  ?Musculoskeletal:     ?   General: Normal range of motion.  ?   Cervical back: Neck supple.  ?Lymphadenopathy:  ?   Cervical: No cervical adenopathy.  ?Skin: ?   Findings: No erythema or rash.  ?Neurological:  ?   Mental Status: He is alert and oriented to person, place,  and time.  ?   Motor: No abnormal muscle tone.  ?   Coordination: Coordination normal.  ?Psychiatric:  ?   Comments: Patient is suicidal and homicidal  ? ? ?ED Results / Procedures / Treatments   ?Labs ?(all labs ordered are listed, but only abnormal results are displayed) ?Labs Reviewed  ?CBC WITH DIFFERENTIAL/PLATELET - Abnormal; Notable for the following components:  ?    Result Value  ? Hemoglobin 12.4 (*)   ? HCT 38.0 (*)   ? RDW 17.6 (*)   ? All other components within normal limits  ?COMPREHENSIVE METABOLIC PANEL - Abnormal; Notable for the following components:  ? Calcium 8.6 (*)   ? AST 42 (*)   ? All other components within normal limits   ?ETHANOL - Abnormal; Notable for the following components:  ? Alcohol, Ethyl (B) 415 (*)   ? All other components within normal limits  ?RAPID URINE DRUG SCREEN, HOSP PERFORMED  ? ? ?EKG ?None ? ?Radiology ?No results found. ? ?Procedures ?Procedures  ? ? ?Medications Ordered in ED ?Medications - No data to display ? ?ED Course/ Medical Decision Making/ A&P ?Patient with EtOH abuse.  Alcohol level 400.  He has suicidal and homicidal ideations and we are waiting behavioral health consult ?                        ?Medical Decision Making ?Amount and/or Complexity of Data Reviewed ?Labs: ordered. ?Radiology: ordered. ? ?Risk ?OTC drugs. ?Prescription drug management. ? ?This patient presents to the ED for concern of suicidal ideation, this involves an extensive number of treatment options, and is a complaint that carries with it a high risk of complications and morbidity.  The differential diagnosis includes EtOH abuse and suicidal ideation ? ? ?Co morbidities that complicate the patient evaluation ? ?Alcohol abuse ? ? ?Additional history obtained: ? ?Additional history obtained from police ?External records from outside source obtained and reviewed including hospital records ? ? ?Lab Tests: ? ?I Ordered, and personally interpreted labs.  The pertinent results include: Chemistries unremarkable, hemoglobin mildly low at 12.4, alcohol of 415 ? ? ?Imaging Studies ordered: ? ?I ordered imaging studies including CT head ?I independently visualized and interpreted imaging which showed negative ?I agree with the radiologist interpretation ? ? ?Cardiac Monitoring: / EKG: ? ?The patient was maintained on a cardiac monitor.  I personally viewed and interpreted the cardiac monitored which showed an underlying rhythm of: Normal sinus rhythm ? ? ?Consultations Obtained: ? ?I requested consultation with the psychiatry,  and discussed lab and imaging findings as well as pertinent plan - they recommend: Transfer to behavioral health  urgent care ? ? ?Problem List / ED Course / Critical interventions / Medication management ? ?Alcohol abuse and suicidal ideation ?No medicines ordered ?Reevaluation of the patient after these medicines showed that the patient improved ?I have reviewed the patients home medicines and have made adjustments as needed ? ? ?Social Determinants of Health: ? ?Alcohol abuse ? ? ?Test / Admission - Considered: ? ?None ? ?Patient with EtOH abuse and suicidal and homicidal ideations ?Disposition will be done by my colleague ? ? ? ? ? ? ?Final Clinical Impression(s) / ED Diagnoses ?Final diagnoses:  ?None  ? ? ?Rx / DC Orders ?ED Discharge Orders   ? ? None  ? ?  ? ? ?  ?Milton Ferguson, MD ?07/16/21 1655 ? ?

## 2021-07-15 NOTE — ED Triage Notes (Signed)
Pt arrives via [employer] with reports of suicidal ideation and being intoxicated. Pt voluntary at this time. Calm and cooperative.  ?

## 2021-07-15 NOTE — ED Notes (Signed)
Dr. Francia Greaves aware of patients CIWA scores and HR.  Per Dr. Francia Greaves, reassess in one hour and notify him if CIWA and HR remain elevated.  ?

## 2021-07-15 NOTE — ED Notes (Signed)
Pt sitting up eating, chatting with staff walking by.  ?

## 2021-07-15 NOTE — BH Assessment (Signed)
TTS attempted to see pt at 1630 however, per RN pt is intoxicated and recommends TTS to attempt later when pt sobers up.  ?

## 2021-07-15 NOTE — ED Notes (Signed)
Pt dressed out in burgundy scrubs. Pt calm and cooperative at this time.  ?

## 2021-07-16 ENCOUNTER — Inpatient Hospital Stay (HOSPITAL_COMMUNITY)
Admission: AD | Admit: 2021-07-16 | Discharge: 2021-07-23 | DRG: 885 | Disposition: A | Discharge status: Home / Self Care | Payer: Medicaid - Out of State | Source: Intra-hospital | Attending: Psychiatry | Admitting: Psychiatry

## 2021-07-16 ENCOUNTER — Other Ambulatory Visit: Payer: Self-pay

## 2021-07-16 ENCOUNTER — Encounter (HOSPITAL_COMMUNITY): Payer: Self-pay | Admitting: Student in an Organized Health Care Education/Training Program

## 2021-07-16 ENCOUNTER — Encounter (HOSPITAL_COMMUNITY): Payer: Self-pay | Admitting: Emergency Medicine

## 2021-07-16 ENCOUNTER — Ambulatory Visit (HOSPITAL_COMMUNITY)
Admission: EM | Admit: 2021-07-16 | Discharge: 2021-07-16 | Disposition: A | Discharge status: Home / Self Care | Payer: Medicaid - Out of State | Source: Home / Self Care

## 2021-07-16 DIAGNOSIS — Z886 Allergy status to analgesic agent status: Secondary | ICD-10-CM | POA: Diagnosis not present

## 2021-07-16 DIAGNOSIS — R45850 Homicidal ideations: Secondary | ICD-10-CM | POA: Insufficient documentation

## 2021-07-16 DIAGNOSIS — F10139 Alcohol abuse with withdrawal, unspecified: Secondary | ICD-10-CM | POA: Diagnosis present

## 2021-07-16 DIAGNOSIS — F332 Major depressive disorder, recurrent severe without psychotic features: Secondary | ICD-10-CM | POA: Insufficient documentation

## 2021-07-16 DIAGNOSIS — F4010 Social phobia, unspecified: Secondary | ICD-10-CM | POA: Diagnosis present

## 2021-07-16 DIAGNOSIS — Z5900 Homelessness unspecified: Secondary | ICD-10-CM | POA: Diagnosis not present

## 2021-07-16 DIAGNOSIS — Z20822 Contact with and (suspected) exposure to covid-19: Secondary | ICD-10-CM | POA: Diagnosis present

## 2021-07-16 DIAGNOSIS — G4700 Insomnia, unspecified: Secondary | ICD-10-CM | POA: Diagnosis present

## 2021-07-16 DIAGNOSIS — Z91018 Allergy to other foods: Secondary | ICD-10-CM | POA: Diagnosis not present

## 2021-07-16 DIAGNOSIS — R45851 Suicidal ideations: Secondary | ICD-10-CM | POA: Diagnosis present

## 2021-07-16 DIAGNOSIS — Y908 Blood alcohol level of 240 mg/100 ml or more: Secondary | ICD-10-CM | POA: Diagnosis present

## 2021-07-16 DIAGNOSIS — I10 Essential (primary) hypertension: Secondary | ICD-10-CM | POA: Diagnosis present

## 2021-07-16 DIAGNOSIS — Z79899 Other long term (current) drug therapy: Secondary | ICD-10-CM

## 2021-07-16 DIAGNOSIS — Z882 Allergy status to sulfonamides status: Secondary | ICD-10-CM

## 2021-07-16 DIAGNOSIS — F1010 Alcohol abuse, uncomplicated: Secondary | ICD-10-CM | POA: Insufficient documentation

## 2021-07-16 DIAGNOSIS — M792 Neuralgia and neuritis, unspecified: Secondary | ICD-10-CM | POA: Diagnosis present

## 2021-07-16 DIAGNOSIS — F17210 Nicotine dependence, cigarettes, uncomplicated: Secondary | ICD-10-CM | POA: Diagnosis present

## 2021-07-16 DIAGNOSIS — Z811 Family history of alcohol abuse and dependence: Secondary | ICD-10-CM | POA: Diagnosis not present

## 2021-07-16 DIAGNOSIS — F10129 Alcohol abuse with intoxication, unspecified: Secondary | ICD-10-CM | POA: Diagnosis present

## 2021-07-16 DIAGNOSIS — Z9151 Personal history of suicidal behavior: Secondary | ICD-10-CM | POA: Insufficient documentation

## 2021-07-16 LAB — ETHANOL: Alcohol, Ethyl (B): <10 mg/dL (ref <10)

## 2021-07-16 MED ORDER — THIAMINE HCL 100 MG PO TABS
100.0000 mg | ORAL_TABLET | Freq: Every day | ORAL | Status: DC
  Administered 2021-07-17 – 2021-07-23 (×7): 100 mg via ORAL
  Filled 2021-07-16 (×9): qty 1

## 2021-07-16 MED ORDER — TRAZODONE HCL 50 MG PO TABS
50.0000 mg | ORAL_TABLET | Freq: Every evening | ORAL | Status: DC | PRN
  Administered 2021-07-16 – 2021-07-19 (×4): 50 mg via ORAL
  Filled 2021-07-16 (×4): qty 1

## 2021-07-16 MED ORDER — THIAMINE HCL 100 MG/ML IJ SOLN
100.0000 mg | Freq: Once | INTRAMUSCULAR | Status: AC
  Administered 2021-07-17: 100 mg via INTRAMUSCULAR
  Filled 2021-07-16: qty 2

## 2021-07-16 MED ORDER — HYDROXYZINE HCL 25 MG PO TABS
25.0000 mg | ORAL_TABLET | Freq: Four times a day (QID) | ORAL | Status: AC | PRN
  Administered 2021-07-16 – 2021-07-19 (×4): 25 mg via ORAL
  Filled 2021-07-16 (×4): qty 1

## 2021-07-16 MED ORDER — LORAZEPAM 1 MG PO TABS: 1.0000 mg | ORAL_TABLET | Freq: Three times a day (TID) | ORAL | Status: DC

## 2021-07-16 MED ORDER — LORAZEPAM 1 MG PO TABS
1.0000 mg | ORAL_TABLET | Freq: Four times a day (QID) | ORAL | Status: AC | PRN
  Administered 2021-07-17: 1 mg via ORAL
  Filled 2021-07-16: qty 1

## 2021-07-16 MED ORDER — ACETAMINOPHEN 325 MG PO TABS
650.0000 mg | ORAL_TABLET | Freq: Four times a day (QID) | ORAL | Status: DC | PRN
  Administered 2021-07-16 – 2021-07-17 (×2): 650 mg via ORAL
  Filled 2021-07-16 (×2): qty 2

## 2021-07-16 MED ORDER — LORAZEPAM 1 MG PO TABS
1.0000 mg | ORAL_TABLET | Freq: Four times a day (QID) | ORAL | Status: DC
  Administered 2021-07-16 – 2021-07-17 (×2): 1 mg via ORAL
  Filled 2021-07-16 (×2): qty 1

## 2021-07-16 MED ORDER — THIAMINE HCL 100 MG PO TABS: 100.0000 mg | ORAL_TABLET | Freq: Every day | ORAL | Status: DC

## 2021-07-16 MED ORDER — LORAZEPAM 1 MG PO TABS
1.0000 mg | ORAL_TABLET | Freq: Every day | ORAL | Status: DC
Start: 2021-07-21 — End: 2021-07-17

## 2021-07-16 MED ORDER — LOPERAMIDE HCL 2 MG PO CAPS: 2.0000 mg | ORAL_CAPSULE | ORAL | Status: AC | PRN

## 2021-07-16 MED ORDER — ADULT MULTIVITAMIN W/MINERALS CH
1.0000 | ORAL_TABLET | Freq: Every day | ORAL | Status: DC
  Administered 2021-07-17 – 2021-07-23 (×7): 1 via ORAL
  Filled 2021-07-16 (×5): qty 1
  Filled 2021-07-16: qty 7
  Filled 2021-07-16 (×4): qty 1

## 2021-07-16 MED ORDER — ADULT MULTIVITAMIN W/MINERALS CH
1.0000 | ORAL_TABLET | Freq: Every day | ORAL | Status: DC
  Administered 2021-07-16: 1 via ORAL
  Filled 2021-07-16: qty 1

## 2021-07-16 MED ORDER — ONDANSETRON 4 MG PO TBDP: 4.0000 mg | ORAL_TABLET | Freq: Four times a day (QID) | ORAL | Status: DC | PRN

## 2021-07-16 MED ORDER — MAGNESIUM HYDROXIDE 400 MG/5ML PO SUSP: 30.0000 mL | Freq: Every day | ORAL | Status: DC | PRN

## 2021-07-16 MED ORDER — HYDROXYZINE HCL 25 MG PO TABS: 25.0000 mg | ORAL_TABLET | Freq: Three times a day (TID) | ORAL | Status: DC | PRN

## 2021-07-16 MED ORDER — HYDROXYZINE HCL 25 MG PO TABS: 25.0000 mg | ORAL_TABLET | Freq: Four times a day (QID) | ORAL | Status: DC | PRN

## 2021-07-16 MED ORDER — LORAZEPAM 1 MG PO TABS: 1.0000 mg | ORAL_TABLET | Freq: Four times a day (QID) | ORAL | Status: DC | PRN

## 2021-07-16 MED ORDER — LORAZEPAM 1 MG PO TABS
1.0000 mg | ORAL_TABLET | Freq: Four times a day (QID) | ORAL | Status: DC
  Administered 2021-07-16 (×3): 1 mg via ORAL
  Filled 2021-07-16 (×3): qty 1

## 2021-07-16 MED ORDER — ONDANSETRON 4 MG PO TBDP
4.0000 mg | ORAL_TABLET | Freq: Four times a day (QID) | ORAL | Status: AC | PRN
  Administered 2021-07-17: 4 mg via ORAL
  Filled 2021-07-16: qty 1

## 2021-07-16 MED ORDER — ALUM & MAG HYDROXIDE-SIMETH 200-200-20 MG/5ML PO SUSP: 30.0000 mL | ORAL | Status: DC | PRN

## 2021-07-16 MED ORDER — THIAMINE HCL 100 MG/ML IJ SOLN
100.0000 mg | Freq: Once | INTRAMUSCULAR | Status: AC
  Administered 2021-07-16: 100 mg via INTRAMUSCULAR
  Filled 2021-07-16: qty 2

## 2021-07-16 MED ORDER — ACETAMINOPHEN 325 MG PO TABS
650.0000 mg | ORAL_TABLET | Freq: Once | ORAL | Status: AC
  Administered 2021-07-16: 650 mg via ORAL
  Filled 2021-07-16: qty 2

## 2021-07-16 MED ORDER — LORAZEPAM 1 MG PO TABS: 1.0000 mg | ORAL_TABLET | Freq: Every day | ORAL | Status: DC

## 2021-07-16 MED ORDER — LORAZEPAM 1 MG PO TABS
1.0000 mg | ORAL_TABLET | Freq: Two times a day (BID) | ORAL | Status: DC
Start: 2021-07-18 — End: 2021-07-16

## 2021-07-16 MED ORDER — LORAZEPAM 1 MG PO TABS: 1.0000 mg | ORAL_TABLET | Freq: Two times a day (BID) | ORAL | Status: DC

## 2021-07-16 MED ORDER — LOPERAMIDE HCL 2 MG PO CAPS: 2.0000 mg | ORAL_CAPSULE | ORAL | Status: DC | PRN

## 2021-07-16 NOTE — ED Notes (Signed)
Pt taken outside with peers and staff due to fire alarm.   Pt was brought back inside once all clear was indicated.   Staff will continue to monitor for safety.  ?

## 2021-07-16 NOTE — ED Notes (Signed)
Pt ambulated to restroom and back to room with no assistance, will call [facility] ?

## 2021-07-16 NOTE — ED Notes (Signed)
Patient was transferred to [facility]. Patient was given his scheduled dose of Ativan prior to leaving. Patient reported he had HI towards his sister's boyfriend earlier today. Patient has been calm and cooperative throughout the shift. Patient was escorted and transported by safe transport and sent with his EMTALA.  ?

## 2021-07-16 NOTE — Discharge Instructions (Addendum)
You are being to [facility] for further inpatient treatment ?

## 2021-07-16 NOTE — ED Notes (Signed)
Attempted to ambulate patient and he was very unsteady on his feet, called to [facility] and talked to [place] and will attempt to ambulate again ?

## 2021-07-16 NOTE — ED Notes (Signed)
Patient was admitted to the [facility]. Patient denies SI. Patient has been calm and cooperative. Patient is slightly shaky when ambulating. Patient is able to bear his own weight. Patient ate breakfast and rested afterwards.  ?

## 2021-07-16 NOTE — ED Notes (Signed)
Pt was given a muffin, cereal, and coffee for breakfast.

## 2021-07-16 NOTE — ED Provider Notes (Signed)
Behavioral Health Urgent Care Medical Screening Exam ? ?Patient Name: Barry Moore ?MRN: [id number] ?Date of Evaluation: 07/16/21 ?Chief Complaint:   ?Diagnosis:  ?Final diagnoses:  ?Severe episode of recurrent major depressive disorder, without psychotic features ([place])  ?Suicidal ideation  ?Homicidal ideation  ?Alcohol abuse  ? ? ?History of Present illness: Barry Moore is a 56 y.o. male. He presented to [facility] on 0/38 via police for SI and intoxication.  He reports that he has SI because he cannot be around the person he loves.  He then states his wife has passed and so the only family he has is his sister but that her boyfriend doesn't like him and so he has been sent away.  He states he would just as soon kill himself as the boyfriend.  He then says but since they live in [place] he cannot really hurt the sister's boyfriend.  He states he needs help.  He reports he has been feeling significantly worse the last 2 weeks. ? ?He reports a PPHx of Depression and a "mood disorder."  He reports he is not sure if he has been hospitalized.  He reports he has been to Rehab multiple times.  He reports he is supposed to be on medications but does not remember the names.  He reports a past suicide attempt about 20 yrs ago when he took an entire bottle of Lithium and drank a bottle of alcohol.  He reports a FH of EtOH in both of his parents. ? ?He reports he is currently homeless because his sister and her boyfriend kicked him of their property.  He reports he smokes 1 ppd.  He reports no substance use.  When asked how much EtOH he drinks he reports if he has the money he drinks 3-4 40 ounce bottles. ? ?He reports that in the past when he has withdrawn from EtOH he has had seizures.  He reports he currently has shakes/sweats, dizziness, and SOB. ? ?He reports current SI and HI.  He reports no AVH.  He is willing to have inpatient treatment.  Discussed a bed available at [facility] and he is agreeable to going. ? ?Psychiatric  Specialty Exam ? ?Presentation  ?General Appearance:Disheveled (in scrubs) ? ?Eye Contact:Fair ? ?Speech:Clear and Coherent ? ?Speech Volume:Decreased ? ?Handedness:Right ? ? ?Mood and Affect  ?Mood:Depressed; Hopeless ?Affect:Congruent; Depressed ? ?Thought Process  ?Thought Processes:Coherent ?No AVH. Has SI and HI (towards sister's boyfriend but states "can't do anything about because he is in [place]). ?Descriptions of Associations:Intact ? ?Orientation:Full (Time, Place and Person) ? ?Thought Content:Logical ? Diagnosis of Schizophrenia or Schizoaffective disorder in past: No ?  Hallucinations:None ? ?Ideas of Reference:None ? ?Suicidal Thoughts:Yes, Active ?With Plan; With Access to Means ? ?Homicidal Thoughts:Yes, Passive ?-- (states he can't do anything to sister's boyfriend because he is in [place].) ? ? ?Sensorium  ?Memory:Immediate Fair; Recent Fair ?Judgment:Intact ?Insight:Present ? ?Executive Functions  ?Concentration:Fair ?Attention Span:Fair ?Recall:Fair ?Fund of [place] ?Language:Good ? ?Psychomotor Activity  ?Psychomotor Activity:Tremor (in arms/hands) ? ?Assets  ?Assets:Communication Skills; Desire for Improvement ? ?Sleep  ?Sleep:Poor ? ? ? ?Physical Exam: ?Physical Exam ?Vitals and nursing note reviewed.  ?Constitutional:   ?   General: He is not in acute distress. ?   Appearance: Normal appearance. He is normal weight. He is not ill-appearing or toxic-appearing.  ?HENT:  ?   Head: Normocephalic and atraumatic.  ?Pulmonary:  ?   Effort: Pulmonary effort is normal.  ?Musculoskeletal:     ?   General:  Normal range of motion.  ?Neurological:  ?   Mental Status: He is alert.  ?   Comments: Tremor in arms/hands  ? ?Review of Systems  ?Constitutional:  Positive for diaphoresis.  ?Respiratory:  Positive for shortness of breath. Negative for cough.   ?Cardiovascular:  Negative for chest pain.  ?Gastrointestinal:  Negative for abdominal pain, constipation, diarrhea, nausea and vomiting.   ?Neurological:  Positive for tremors, weakness and headaches.  ?Psychiatric/Behavioral:  Positive for depression, substance abuse and suicidal ideas. Negative for hallucinations. The patient is nervous/anxious and has insomnia.   ?Blood pressure (!) 160/80, pulse (!) 106, temperature 98.1 ?F (36.7 ?C), temperature source Oral, resp. rate 19, SpO2 98 %. There is no height or weight on file to calculate BMI. ? ?Musculoskeletal: ?Strength & Muscle Tone: within normal limits ?Gait & Station: normal ?Patient leans: N/A ? ? ?[facility] MSE Discharge Disposition for Follow up and Recommendations: ?Based on my evaluation I certify that psychiatric inpatient services furnished can reasonably be expected to improve the patient's condition which I recommend transfer to an appropriate accepting facility.  ? ?Since he reports history of withdrawal seizures we will start him on an Ativan taper (will not start Librium due to concern for Liver damage from EtOH).  We will also start CIWA monitoring.  A bed offer has been received from [facility] so he will be transferred there for continued care. ? ?Briant Cedar, MD ?07/16/2021, 3:58 PM ? ?

## 2021-07-16 NOTE — Discharge Instructions (Addendum)
It was our pleasure to provide your ER care today - we hope that you feel better. ? ?Drink plenty of fluids/stay well hydrated.  ? ?Avoid alcohol use as it is harmful to your physical health and mental well-being. See resource guide provided to help access outpatient alcohol use treatment programs.  ? ?Follow up with primary care doctor and behavioral health provider in the next 1-2 weeks.  ? ?For mental health issues and/or crisis you can also go to the [facility] Urgent [facility]  - it is open 24/7 and walk-ins are welcome. ? ?Return to ER if worse, new symptoms, fevers, chest pain, trouble breathing, or other concern.  ?

## 2021-07-16 NOTE — ED Provider Notes (Signed)
Patient seen after prior ED provider. ? ?Patient was noted to be tachycardic to the 130s. ? ?EKG revealed sinus tach. ? ?Repeat vital check revealed temperature of 100.3. ? ?IV fluids administered.  Acetaminophen given. ? ?Patient's tachycardia improved with administration of IV fluids and Tylenol. ? ?Work-up did not reveal evidence of significant infection.  Patient's white count is 9.1.  Patient's COVID and flu test were negative.  Urine sample obtained was without evidence of UTI.  Chest x-ray is clear. ? ?Patient's alcohol level was elevated at 415 on arrival.  Patient's lactic acid was mildly elevated 2.0.  With administration of IV fluids lactic acid is improved to 1.6. ? ?Patient without evidence of significant infection requiring admission or further medical work-up. ? ?Patient is medically clear for psychiatric evaluation --TTS evaluation is pending. ?  ?Valarie Merino, MD ?07/16/21 0005 ? ?

## 2021-07-16 NOTE — ED Provider Notes (Signed)
[facility] team has assessed and recommends transport to [facility] now for further [facility] evaluation/care. ? ?Pt alert, content, no distress. Pt currently appears stable for transport. ? ? ?  ?Lajean Saver, MD ?07/16/21 [phone] ? ?

## 2021-07-16 NOTE — ED Provider Notes (Addendum)
Patient transferred to [facility]. ?  Lennice Sites, DO ?07/16/21 0801 ? ?  ?Lennice Sites, DO ?07/16/21 0801 ? ?

## 2021-07-16 NOTE — Progress Notes (Signed)
[facility]/BMU LCSW Progress Note ?  ?07/16/2021    2:44 PM ? ?Barry Moore  ? ?[id number]  ? ?Type of Contact and Topic:  Psychiatric Bed Placement  ? ?Pt accepted to [facility] [id number]   ? ?Patient meets inpatient criteria per Beatriz Stallion, NP ? ?The attending provider will be Viann Fish, MD  ? ?Call report to [phone]   ? ?[name], RN @ [facility] notified.    ? ?Pt scheduled  to arrive at [facility] at 1630. ? ?Mariea Clonts, MSW, LCSW-A  ?2:46 PM 07/16/2021   ?  ? ?  ?  ? ? ? ? ?  ?

## 2021-07-16 NOTE — ED Notes (Signed)
Discussed care with [place] [facility] - per nurse patient still requiring assistance to go to ambulate. Talked with accepting provider and [facility] and [facility] - patient to come when he can ambulate on own - secure chat sent ?

## 2021-07-16 NOTE — ED Notes (Signed)
[facility] called and spoke with nurse Thurnell Garbe to see if they were ready to take pt. She notified me to wait until speaking with dayshift provider. She was given a phone number to [facility] ED once this has been done.  ?

## 2021-07-16 NOTE — BH Assessment (Signed)
Clinician messaged Alphonsus Sias. Eulas Post, RN: "Hey. It's Trey with TTS. Is the pt able to engage in the assessment, if so the pt will need to be placed in a private room. Also is the pt under IVC?" ? ?Clinician waiting response.  ? ? ?Vertell Novak, MS, [facility] ?Triage Specialist ?[phone] ? ?

## 2021-07-16 NOTE — ED Notes (Signed)
Pt was given a sub, chips, and juice for lunch.  

## 2021-07-16 NOTE — ED Notes (Signed)
Vol consent obtained and faxed to [facility]  ?

## 2021-07-16 NOTE — BH Assessment (Signed)
Comprehensive Clinical Assessment (CCA) Note ? ?07/16/2021 ?Barry Moore ?[id number] ? ?Disposition: Lindon Romp, PMHNP recommends pt to be admitted to [facility] for Continuous Assessment. Clinician messaged Alphonsus Sias. Eulas Post, RN, pt to come to [facility] once sober due to pt can not currently ambulate without 2 assisting.  ? ?[place] [facility] from 07/15/2021 in [place] DEPT ED from 10/13/2020 in [place] DEPT ED from 10/08/2020 in [place]  ?C-SSRS RISK CATEGORY High Risk High Risk High Risk  ? ?  ? ?The patient demonstrates the following risk factors for suicide: Chronic risk factors for suicide include: psychiatric disorder of Major Depressive Disorder, recurrent, severe with psychotic features, substance use disorder, and previous suicide attempts Pt reports, previous suicide attempt . Acute risk factors for suicide include: family or marital conflict and Pt reports, he is suicidal and was about to end it all but the police stopped him . Protective factors for this patient include:  None . Considering these factors, the overall suicide risk at this point appears to be high. Patient is appropriate for outpatient follow up. ? ?Barry Moore is a 56 year old male who presents voluntary and unaccompanied to [facility]. Clinician asked the pt, "what brought you to the hospital?" Pt reports, he's was very suicidal  and was about to cut his wrist with his pocket knife and end it but was stopped by police. Pt reports, "I can't be around my sister, her boyfriend can't stand me." Pt reports, he was living on his sisters land but was evicted and is currently homeless. Pt reports, he walked away from his sister's boyfriend to not do anything stupid in front of her. Pt reports, hearing and seeing things on and off; but did not disclose. Pt denies, HI, self-injurious behaviors.  ? ?Pt reports, drinking 4 bottles beer. Pt's BAL was  415 at 1455. Per chart, pt has a history of alcohol abuse and withdrawal symptoms. Pt denies, being linked to OPT resources (medication management and/or counseling.) Pt reports, previous rehab admissions.  ? ?Pt presents quiet, awake in scrubs with slurred speech. Pt's mood, affect was depressed. Pt's insight and judgement was poor. Pt reports, if discharged he can not contract for safety.  ? ?Diagnosis: Major Depressive Disorder, recurrent, severe with psychotic features.  ?                  Alcohol abuse with intoxication ([place]). ? ?Chief Complaint:  ?Chief Complaint  ?Patient presents with  ? Alcohol Intoxication  ? ?Visit Diagnosis:   ? ? ?CCA Screening, Triage and Referral (STR) ? ?Patient Reported Information ?How did you hear about [name]? Legal System ? ?What Is the Reason for Your Visit/Call Today? Per EDP note: "Patient with a history of alcohol abuse.Patient states he wants to kill himself and also kill his sister's husband. He is brought in by the police." ? ?How Long Has This Been Causing You Problems? > than 6 months ? ?What Do You Feel Would Help You the Most Today? Alcohol or Drug Use Treatment; Housing Assistance; Treatment for Depression or other mood problem ? ? ?Have You Recently Had Any Thoughts About Hurting Yourself? Yes ? ?Are You Planning to Commit Suicide/Harm Yourself At This time? Yes ? ? ?Have you Recently Had Thoughts About [place]? No ? ?Are You Planning to Harm Someone at This Time? No ? ?Explanation: No data recorded ? ?Have You Used Any Alcohol or Drugs in the Past 24  Hours? Yes ? ?How Long Ago Did You Use Drugs or Alcohol? No data recorded ?What Did You Use and How Much? Pt reports, he drank 4 bottles of beer. Pt's BAL was 415 at 1455. ? ? ?Do You Currently Have a Therapist/Psychiatrist? No ? ?Name of Therapist/Psychiatrist: No data recorded ? ?Have You Been Recently Discharged From Any Office Practice or Programs? No ? ?Explanation of Discharge From Practice/Program: No  data recorded ? ?  ?CCA Screening Triage Referral Assessment ?Type of Contact: Tele-Assessment ? ?Telemedicine Service Delivery: Telemedicine service delivery: This service was provided via telemedicine using a 2-way, interactive audio and video technology ? ?Is this Initial or Reassessment? Initial Assessment ? ?Date Telepsych consult ordered in [facility]:  07/15/21 ? ?Time Telepsych consult ordered in [facility]:  1424 ? ?Location of Assessment: [facility] ED ? ?Provider Location: [facility] Assessment Services ? ? ?Collateral Involvement: None ? ? ?Does Patient Have a Stage manager Guardian? No data recorded ?Name and Contact of Legal Guardian: No data recorded ?If Minor and Not Living with Parent(s), Who has Custody? No data recorded ?Is CPS involved or ever been involved? -- (unknown) ? ?Is APS involved or ever been involved? Never ? ? ?Patient Determined To Be At Risk for Harm To Self or Others Based on Review of Patient Reported Information or Presenting Complaint? Yes, for Self-Harm ? ?Method: No data recorded ?Availability of Means: No data recorded ?Intent: No data recorded ?Notification Required: No data recorded ?Additional Information for Danger to Others Potential: No data recorded ?Additional Comments for Danger to Others Potential: No data recorded ?Are There Guns or Other Weapons in [place]? No data recorded ?Types of Guns/Weapons: No data recorded ?Are These Weapons Safely Secured?                            No data recorded ?Who Could Verify You Are Able To Have These Secured: No data recorded ?Do You Have any Outstanding Charges, Pending Court Dates, Parole/Probation? No data recorded ?Contacted To Inform of Risk of Harm To Self or Others: Event organiser; Family/Significant Other: ? ? ? ?Does Patient Present under Involuntary Commitment? No ? ?IVC Papers Initial File Date: No data recorded ? ?[place] of Residence: Other (Comment) (UTA) ? ? ?Patient Currently Receiving the Following Services: Not Receiving  Services ? ? ?Determination of Need: Urgent (48 hours) ? ? ?Options For Referral: Other: Comment (Pt to be admitted to [facility] for Continuous Assessment.) ? ? ? ? ?CCA Biopsychosocial ?Patient Reported Schizophrenia/Schizoaffective Diagnosis in Past: No ? ? ?Strengths: NA ? ? ?Mental Health Symptoms ?Depression:   ?Difficulty Concentrating; Fatigue; Hopelessness; Irritability; Sleep (too much or little); Tearfulness; Worthlessness (Pt reports, getting four hours of sleep.) ?  ?Duration of Depressive symptoms:    ?Mania:   ?Irritability ?  ?Anxiety:    ?Difficulty concentrating; Fatigue; Irritability; Restlessness; Tension; Worrying ?  ?Psychosis:   ?Hallucinations ?  ?Duration of Psychotic symptoms:    ?Trauma:   ?None ?  ?Obsessions:   ?None ?  ?Compulsions:   ?None ?  ?Inattention:   ?Loses things ?  ?Hyperactivity/Impulsivity:   ?Feeling of restlessness; Fidgets with hands/feet ?  ?Oppositional/Defiant Behaviors:   ?Angry ?  ?Emotional Irregularity:   ?None ?  ?Other Mood/Personality Symptoms:  No data recorded  ? ?Mental Status Exam ?Appearance and self-care  ?Stature:   ?Average ?  ?Weight:   ?Average weight ?  ?Clothing:   ?Disheveled (Pt in scrubs.) ?  ?  Grooming:   ?Neglected ?  ?Cosmetic use:   ?None ?  ?Posture/gait:   ?Other (Comment) (Pt layingin hospital bed.) ?  ?Motor activity:   ?Not Remarkable ?  ?Sensorium  ?Attention:   ?Distractible ?  ?Concentration:   ?Normal ?  ?Orientation:   ?X5 ?  ?Recall/memory:   ?Normal ?  ?Affect and Mood  ?Affect:   ?Depressed ?  ?Mood:   ?Depressed ?  ?Relating  ?Eye contact:   ?Normal ?  ?Facial expression:   ?Depressed ?  ?Attitude toward examiner:   ?Cooperative ?  ?Thought and Language  ?Speech flow:  ?Slurred ?  ?Thought content:   ?Appropriate to Mood and Circumstances ?  ?Preoccupation:   ?Suicide ?  ?Hallucinations:   ?Auditory; Visual ?  ?Organization:  No data recorded  ?Executive Functions  ?Fund of Knowledge:   ?[place] ?  ?Intelligence:   ?Average ?   ?Abstraction:   ?Normal ?  ?Judgement:   ?Poor ?  ?Reality Testing:   ?Variable ?  ?Insight:   ?Poor ?  ?Decision Making:   ?Impulsive ?  ?Social Functioning  ?Social Maturity:   ?Impulsive ?  ?Social Judgement:   ?"
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
## 2021-07-17 ENCOUNTER — Encounter (HOSPITAL_COMMUNITY): Payer: Self-pay | Admitting: Student in an Organized Health Care Education/Training Program

## 2021-07-17 ENCOUNTER — Encounter (HOSPITAL_COMMUNITY): Payer: Self-pay

## 2021-07-17 DIAGNOSIS — F332 Major depressive disorder, recurrent severe without psychotic features: Principal | ICD-10-CM

## 2021-07-17 LAB — COMPREHENSIVE METABOLIC PANEL
ALT: 26 U/L (ref 0–44)
AST: 68 U/L — ABNORMAL HIGH (ref 15–41)
Albumin: 3.6 g/dL (ref 3.5–5.0)
Alkaline Phosphatase: 143 U/L — ABNORMAL HIGH (ref 38–126)
Anion gap: 9 (ref 5–15)
BUN: 12 mg/dL (ref 6–20)
CO2: 25 mmol/L (ref 22–32)
Calcium: 9.1 mg/dL (ref 8.9–10.3)
Chloride: 103 mmol/L (ref 98–111)
Creatinine, Ser: 0.91 mg/dL (ref 0.61–1.24)
GFR, Estimated: >60 mL/min (ref >60)
Glucose, Bld: 181 mg/dL — ABNORMAL HIGH (ref 70–99)
Potassium: 3.7 mmol/L (ref 3.5–5.1)
Sodium: 137 mmol/L (ref 135–145)
Total Bilirubin: 0.6 mg/dL (ref 0.3–1.2)
Total Protein: 7.6 g/dL (ref 6.5–8.1)

## 2021-07-17 LAB — LIPID PANEL
Cholesterol: 180 mg/dL (ref 0–200)
HDL: 104 mg/dL (ref >40)
LDL Cholesterol: 66 mg/dL (ref 0–99)
Total CHOL/HDL Ratio: 1.7 RATIO
Triglycerides: 51 mg/dL (ref <150)
VLDL: 10 mg/dL (ref 0–40)

## 2021-07-17 LAB — TSH: TSH: 1.029 u[IU]/mL (ref 0.350–4.500)

## 2021-07-17 LAB — HEMOGLOBIN A1C
Hgb A1c MFr Bld: 5.7 % — ABNORMAL HIGH (ref 4.8–5.6)
Mean Plasma Glucose: 116.89 mg/dL

## 2021-07-17 MED ORDER — CHLORDIAZEPOXIDE HCL 25 MG PO CAPS
25.0000 mg | ORAL_CAPSULE | Freq: Every day | ORAL | Status: DC
  Filled 2021-07-17: qty 1

## 2021-07-17 MED ORDER — CHLORDIAZEPOXIDE HCL 25 MG PO CAPS
25.0000 mg | ORAL_CAPSULE | ORAL | Status: AC
  Administered 2021-07-19 – 2021-07-20 (×2): 25 mg via ORAL
  Filled 2021-07-17: qty 1

## 2021-07-17 MED ORDER — CHLORDIAZEPOXIDE HCL 25 MG PO CAPS
25.0000 mg | ORAL_CAPSULE | Freq: Four times a day (QID) | ORAL | Status: AC | PRN
  Administered 2021-07-18: 25 mg via ORAL
  Filled 2021-07-17: qty 1

## 2021-07-17 MED ORDER — CHLORDIAZEPOXIDE HCL 25 MG PO CAPS
25.0000 mg | ORAL_CAPSULE | Freq: Three times a day (TID) | ORAL | Status: AC
  Administered 2021-07-18 – 2021-07-19 (×3): 25 mg via ORAL
  Filled 2021-07-17 (×3): qty 1

## 2021-07-17 MED ORDER — CLONIDINE HCL 0.1 MG PO TABS
0.1000 mg | ORAL_TABLET | Freq: Three times a day (TID) | ORAL | Status: DC | PRN
  Administered 2021-07-17: 0.1 mg via ORAL
  Filled 2021-07-17: qty 1

## 2021-07-17 MED ORDER — CHLORDIAZEPOXIDE HCL 25 MG PO CAPS
25.0000 mg | ORAL_CAPSULE | Freq: Four times a day (QID) | ORAL | Status: AC
  Administered 2021-07-17 – 2021-07-18 (×4): 25 mg via ORAL
  Filled 2021-07-17 (×4): qty 1

## 2021-07-17 MED ORDER — SERTRALINE HCL 25 MG PO TABS
25.0000 mg | ORAL_TABLET | Freq: Every day | ORAL | Status: DC
  Administered 2021-07-17 – 2021-07-20 (×4): 25 mg via ORAL
  Filled 2021-07-17 (×6): qty 1

## 2021-07-17 NOTE — [facility] Group Notes (Signed)
Pt attended and contributed to group 

## 2021-07-17 NOTE — H&P (Signed)
Psychiatric Admission Assessment Adult ? ?Patient Identification: Gottfried Standish ?MRN:  [id number] ?Date of Evaluation:  07/17/2021 ?Chief Complaint:  MDD (major depressive disorder), recurrent severe, without psychosis ([place]) [F33.2] ?Principal Diagnosis: MDD (major depressive disorder), recurrent severe, without psychosis ([place]) ?Diagnosis:  Principal Problem: ?  MDD (major depressive disorder), recurrent severe, without psychosis ([place]) ? ?History of Present Illness: Nikolai Wilczak is a 56 year old Caucasian male observed resting in bed.  He reported he was transferred to this behavioral health facility due to homicidal ideation towards his brother-in-law. "  We just do not like each other."  Reports a history of depression, anxiety and suicidal ideations.  He reports his substance abuse history to alcohol as well.  Osker reported drinking 40 ounces of beer 3-4 times daily.  Reports he struggled with depression and anxiety in the past. Stated " I just do not feel good today, I want to lay in bed all day. "  denied that he has been followed by therapy or psychiatry in the past.  Denied that he is supposed to take any psychotropic medications chart reviewed UDS 415 on admission. patient was placed on Ativan detox protocol.  Continues to report headaches and nausea.  ? ?Keevin reported struggling with depression and may have been experiencing some symptoms.  He reports social isolation and having intermittent suicidal ideation denied plan or intent  States depression worsens with alcohol use.  Discussed initiating Zoloft 25 mg p.o. daily, will change Ativan taper to Librium protocol. (AST/ALT 42/19) Patient was receptive to plan.  States he was previously inpatient 10 years ago.  Denies any other illicit drug use.  Reports 1 rehabilitation stay in the past.  Denies any is currently employed.  Support, encouragement and  reassurance was provided. ? ?Per initial admission assessment note:"Ranvir Remmers is a 56 y.o.  male. He presented to [facility] on 4/19 via police for SI and intoxication.  He reports that he has SI because he cannot be around the person he loves.  He then states his wife has passed and so the only family he has is his sister but that her boyfriend doesn't like him and so he has been sent away.  He states he would just as soon kill himself as the boyfriend.  He then says but since they live in [place] he cannot really hurt the sister's boyfriend.  He states he needs help.  He reports he has been feeling significantly worse the last 2 weeks. ?  ?He reports a PPHx of Depression and a "mood disorder."  He reports he is not sure if he has been hospitalized.  He reports he has been to Rehab multiple times.  He reports he is supposed to be on medications but does not remember the names.  He reports a past suicide attempt about 20 yrs ago when he took an entire bottle of Lithium and drank a bottle of alcohol.  He reports a FH of EtOH in both of his parents."  ? ? ?Associated Signs/Symptoms: ?Depression Symptoms:  depressed mood, ?insomnia, ?hopelessness, ?suicidal thoughts without plan, ?anxiety, ?Duration of Depression Symptoms: Greater than two weeks ? ?(Hypo) Manic Symptoms:  Distractibility, ?Irritable Mood, ?Anxiety Symptoms:  Excessive Worry, ?Psychotic Symptoms:  Hallucinations: None ?PTSD Symptoms: ?NA ?Total Time spent with patient: 15 minutes ? ?Past Psychiatric History:  ? ?Is the patient at risk to self? Yes.    ?Has the patient been a risk to self in the past 6 months? Yes.    ?Has the patient been  a risk to self within the distant past? No.  ?Is the patient a risk to others? No.  ?Has the patient been a risk to others in the past 6 months? No.  ?Has the patient been a risk to others within the distant past? No.  ? ?Prior Inpatient Therapy:   ?Prior Outpatient Therapy:   ? ?Alcohol Screening: Patient refused Alcohol Screening Tool: Yes ?1. How often do you have a drink containing alcohol?: 4 or more times a  week ?2. How many drinks containing alcohol do you have on a typical day when you are drinking?: 5 or 6 ?3. How often do you have six or more drinks on one occasion?: Daily or almost daily ?AUDIT-C Score: 10 ?4. How often during the last year have you found that you were not able to stop drinking once you had started?: Weekly ?5. How often during the last year have you failed to do what was normally expected from you because of drinking?: Weekly ?6. How often during the last year have you needed a first drink in the morning to get yourself going after a heavy drinking session?: Weekly ?7. How often during the last year have you had a feeling of guilt of remorse after drinking?: Monthly ?8. How often during the last year have you been unable to remember what happened the night before because you had been drinking?: Monthly ?9. Have you or someone else been injured as a result of your drinking?: No ?10. Has a relative or friend or a doctor or another health worker been concerned about your drinking or suggested you cut down?: No ?Alcohol Use Disorder Identification Test Final Score (AUDIT): 23 ?Alcohol Brief Interventions/Follow-up: Alcohol education/Brief advice ?Substance Abuse History in the last 12 months:  Yes.   ?Consequences of Substance Abuse: ? ?Previous Psychotropic Medications: No  ?Psychological Evaluations: No  ?Past Medical History:  ?Past Medical History:  ?Diagnosis Date  ? Alcoholic hepatitis   ? Depression   ? ETOH abuse   ? Gastric bleed 08/2018  ? Hypertension   ? Pancreatitis   ? Seizures ([place])   ? Suicidal behavior   ?  ?Past Surgical History:  ?Procedure Laterality Date  ? BIOPSY  08/05/2018  ? Procedure: BIOPSY;  Surgeon: Irving Copas., MD;  Location: [place];  Service: Gastroenterology;;  ? BIOPSY  08/07/2018  ? Procedure: BIOPSY;  Surgeon: Thornton Park, MD;  Location: [place];  Service: Gastroenterology;;  ? BIOPSY  01/02/2019  ? Procedure: BIOPSY;  Surgeon: Gatha Mayer, MD;  Location: [facility];  Service: Endoscopy;;  ? COLONOSCOPY WITH PROPOFOL N/A 08/07/2018  ? Procedure: COLONOSCOPY WITH PROPOFOL;  Surgeon: Thornton Park, MD;  Location: [place];  Service: Gastroenterology;  Laterality: N/A;  ? ESOPHAGOGASTRODUODENOSCOPY N/A 01/02/2019  ? Procedure: ESOPHAGOGASTRODUODENOSCOPY (EGD);  Surgeon: Gatha Mayer, MD;  Location: [facility];  Service: Endoscopy;  Laterality: N/A;  ? ESOPHAGOGASTRODUODENOSCOPY N/A 07/03/2019  ? Procedure: ESOPHAGOGASTRODUODENOSCOPY (EGD);  Surgeon: Wonda Horner, MD;  Location: [facility];  Service: Endoscopy;  Laterality: N/A;  ? ESOPHAGOGASTRODUODENOSCOPY N/A 06/26/2020  ? Procedure: ESOPHAGOGASTRODUODENOSCOPY (EGD);  Surgeon: Arta Silence, MD;  Location: [facility];  Service: Endoscopy;  Laterality: N/A;  ? ESOPHAGOGASTRODUODENOSCOPY (EGD) WITH PROPOFOL N/A 11/02/2016  ? Procedure: ESOPHAGOGASTRODUODENOSCOPY (EGD) WITH PROPOFOL;  Surgeon: Carol Ada, MD;  Location: [facility];  Service: Endoscopy;  Laterality: N/A;  ? ESOPHAGOGASTRODUODENOSCOPY (EGD) WITH PROPOFOL N/A 08/05/2018  ? Procedure: ESOPHAGOGASTRODUODENOSCOPY (EGD) WITH PROPOFOL;  Surgeon: Rush Landmark Telford Nab., MD;  Location:  [facility];  Service: Gastroenterology;  Laterality: N/A;  ? ESOPHAGOGASTRODUODENOSCOPY (EGD) WITH PROPOFOL N/A 07/14/2019  ? Procedure: ESOPHAGOGASTRODUODENOSCOPY (EGD) WITH PROPOFOL;  Surgeon: Otis Brace, MD;  Location: [facility];  Service: Gastroenterology;  Laterality: N/A;  ? HERNIA REPAIR    ? LEG SURGERY    ? POLYPECTOMY  08/07/2018  ? Procedure: POLYPECTOMY;  Surgeon: Thornton Park, MD;  Location: [facility];  Service: Gastroenterology;;  ? ?Family History:  ?Family History  ?Problem Relation Age of Onset  ? Diabetes Mother   ? Alcoholism Mother   ? Emphysema Father   ? Lung cancer Father   ? Alcoholism Father   ? ?Family Psychiatric  History:  ?Tobacco Screening:   ?Social History:  ?Social History  ? ?Substance  and Sexual Activity  ?Alcohol Use Yes  ? Comment: 5+ BEERS DAILY, Patient does not know how much he drank tonight  ?   ?Social History  ? ?Substance and Sexual Activity  ?Drug Use Not Currently  ? Aris Everts
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
## 2021-07-17 NOTE — Tx Team (Signed)
Initial Treatment Plan ?07/17/2021 ?12:27 AM ?Barry Moore ?ION:[id number] ? ? ? ?PATIENT STRESSORS: ?Financial difficulties   ?Marital or family conflict   ?Medication change or noncompliance   ?Substance abuse   ? ? ?PATIENT STRENGTHS: ?Ability for insight  ?Communication skills  ?Supportive family/friends  ? ? ?PATIENT IDENTIFIED PROBLEMS: ?"I drink alcohol everyday all day"  ?"I stopped taking medications"  ?"I want to kill my Sisters Boyfriend"  ?  ?  ?  ?  ?  ?  ?  ? ?DISCHARGE CRITERIA:  ?Adequate post-discharge living arrangements ? ?PRELIMINARY DISCHARGE PLAN: ?Outpatient therapy ? ?PATIENT/FAMILY INVOLVEMENT: ?This treatment plan has been presented to and reviewed with the patient, Barry Moore.  The patient and family have been given the opportunity to ask questions and make suggestions. ? ?Wylie Hail, RN ?07/17/2021, 12:27 AM ?

## 2021-07-17 NOTE — BHH Suicide Risk Assessment (Cosign Needed)
Suicide Risk Assessment ? ?Admission Assessment    ?[facility] Admission Suicide Risk Assessment ? ? ?Nursing information obtained from:    ?Demographic factors:  Male, Caucasian, Low socioeconomic status ?Current Mental Status:  Suicidal ideation indicated by patient ?Loss Factors:  Financial problems / change in socioeconomic status ?Historical Factors:  Prior suicide attempts, Impulsivity ?Risk Reduction Factors:  Positive therapeutic relationship ? ?Total Time spent with patient: 15 minutes ?Principal Problem: MDD (major depressive disorder), recurrent severe, without psychosis ([place]) ?Diagnosis:  Principal Problem: ?  MDD (major depressive disorder), recurrent severe, without psychosis ([place]) ? ?Subjective Data:  Barry Moore is a 56 year old Caucasian male observed resting in bed.  He reported he was transferred to this behavioral health facility due to homicidal ideation towards his brother-in-law. "  We just do not like each other."  Reports a history of depression, anxiety and suicidal ideations.  He reports his substance abuse history to alcohol as well.  Demon reported drinking 40 ounces of beer 3-4 times daily.  Reports he struggled with depression and anxiety in the past. Stated " I just do not feel good today, I want to lay in bed all day. "  denied that he has been followed by therapy or psychiatry in the past.  Denied that he is supposed to take any psychotropic medications chart reviewed UDS 415 on admission. patient was placed on Ativan detox protocol.  Continues to report headaches and nausea.  ?  ?Paden reported struggling with depression and may have been experiencing some symptoms.  He reports social isolation and having intermittent suicidal ideation denied plan or intent  States depression worsens with alcohol use.  Discussed initiating Zoloft 25 mg p.o. daily, will change Ativan taper to Librium protocol. (AST/ALT 42/19) Patient was receptive to plan.  States he was previously inpatient 10  years ago.  Denies any other illicit drug use.  Reports 1 rehabilitation stay in the past.  Denies any is currently employed.  Support, encouragement and  reassurance was provided. ? ?Continued Clinical Symptoms:  ?Alcohol Use Disorder Identification Test Final Score (AUDIT): 23 ?The "Alcohol Use Disorders Identification Test", Guidelines for Use in Primary Care, Second Edition.  World Pharmacologist [facility]). ?Score between 0-7:  no or low risk or alcohol related problems. ?Score between 8-15:  moderate risk of alcohol related problems. ?Score between 16-19:  high risk of alcohol related problems. ?Score 20 or above:  warrants further diagnostic evaluation for alcohol dependence and treatment. ? ? ?CLINICAL FACTORS:  ? Anorexia Nervosa ?Depression:   Comorbid alcohol abuse/dependence ?Impulsivity ?Alcohol/Substance Abuse/Dependencies ? ? ?Musculoskeletal: ?Strength & Muscle Tone: within normal limits ?Gait & Station: normal ?Patient leans: N/A ? ?Psychiatric Specialty Exam: ? ?Presentation  ?General Appearance: Disheveled (in scrubs) ? ?Eye Contact:Fair ? ?Speech:Clear and Coherent ? ?Speech Volume:Decreased ? ?Handedness:Right ? ? ?Mood and Affect  ?Mood:Depressed; Hopeless ? ?Affect:Congruent; Depressed ? ? ?Thought Process  ?Thought Processes:Coherent ? ?Descriptions of Associations:Intact ? ?Orientation:Full (Time, Place and Person) ? ?Thought Content:Logical ? ?History of Schizophrenia/Schizoaffective disorder:No ? ?Duration of Psychotic Symptoms:No data recorded ?Hallucinations:Hallucinations: None ? ?Ideas of Reference:None ? ?Suicidal Thoughts:Suicidal Thoughts: Yes, Active ?SI Active Intent and/or Plan: With Plan; With Access to Means ? ?Homicidal Thoughts:Homicidal Thoughts: Yes, Passive ?HI Passive Intent and/or Plan: -- (states he can't do anything to sister's boyfriend because he is in [place].) ? ? ?Sensorium  ?Memory:Immediate Fair; Recent Fair ? ?Judgment:Intact ? ?Insight:Present ? ? ?Executive  Functions  ?Concentration:Fair ? ?Attention Span:Fair ? ?Recall:Fair ? ?Fund of [place] ? ?  Language:Good ? ? ?Psychomotor Activity  ?Psychomotor Activity:Psychomotor Activity: Tremor (in arms/hands) ? ? ?Assets  ?Assets:Communication Skills; Desire for Improvement ? ? ?Sleep  ?Sleep:Sleep: Poor ? ? ? ?Physical Exam: ?Physical Exam ?Vitals and nursing note reviewed.  ?Cardiovascular:  ?   Rate and Rhythm: Normal rate.  ?Psychiatric:     ?   Mood and Affect: Mood normal.     ?   Behavior: Behavior normal.     ?   Thought Content: Thought content normal.  ? ?Review of Systems  ?HENT: Negative.    ?Cardiovascular: Negative.   ?     Elevated  B/P will continue to monitor   ?Gastrointestinal:  Positive for nausea.  ?Skin: Negative.   ?Neurological:  Positive for dizziness and headaches.  ?Psychiatric/Behavioral:  Positive for depression, substance abuse and suicidal ideas. The patient is nervous/anxious.   ?All other systems reviewed and are negative. ?Blood pressure 140/77, pulse 76, temperature 98.3 ?F (36.8 ?C), temperature source Oral, resp. rate 18, SpO2 99 %. There is no height or weight on file to calculate BMI. ? ? ?COGNITIVE FEATURES THAT CONTRIBUTE TO RISK:  ?Closed-mindedness   ? ?SUICIDE RISK:  ? Minimal: No identifiable suicidal ideation.  Patients presenting with no risk factors but with morbid ruminations; may be classified as minimal risk based on the severity of the depressive symptoms ? ?PLAN OF CARE: Take all medications as prescribed. ?Keep all follow-up appointments as scheduled.  ?Do not consume alcohol or use illegal drugs while on prescription medications. ?Report any adverse effects from your medications to your primary care provider promptly.  ?In the event of recurrent symptoms or worsening symptoms, call 911, a crisis hotline, or go to the nearest emergency department for evaluation.   ? ?I certify that inpatient services furnished can reasonably be expected to improve the patient's  condition.  ? ?Derrill Center, NP ?07/17/2021, 1:06 PM ?

## 2021-07-17 NOTE — BH IP Treatment Plan (Signed)
Interdisciplinary Treatment and Diagnostic Plan Update ? ?07/17/2021 ?Time of Session: 10:30am ?Barry Moore ?MRN: [id number] ? ?Principal Diagnosis: <principal problem not specified> ? ?Secondary Diagnoses: Active Problems: ?  MDD (major depressive disorder), recurrent severe, without psychosis ([place]) ? ? ?Current Medications:  ?Current Facility-Administered Medications  ?Medication Dose Route Frequency Provider Last Rate Last Admin  ? acetaminophen (TYLENOL) tablet 650 mg  650 mg Oral Q6H PRN Briant Cedar, MD   650 mg at 07/17/21 2952  ? alum & mag hydroxide-simeth (MAALOX/MYLANTA) 200-200-20 MG/5ML suspension 30 mL  30 mL Oral Q4H PRN Briant Cedar, MD      ? cloNIDine (CATAPRES) tablet 0.1 mg  0.1 mg Oral Q8H PRN Nelda Marseille, Amy E, MD   0.1 mg at 07/17/21 0800  ? hydrOXYzine (ATARAX) tablet 25 mg  25 mg Oral Q6H PRN Briant Cedar, MD   25 mg at 07/16/21 2102  ? loperamide (IMODIUM) capsule 2-4 mg  2-4 mg Oral PRN Briant Cedar, MD      ? LORazepam (ATIVAN) tablet 1 mg  1 mg Oral Q6H PRN Briant Cedar, MD   1 mg at 07/17/21 8413  ? LORazepam (ATIVAN) tablet 1 mg  1 mg Oral QID Briant Cedar, MD   1 mg at 07/17/21 0749  ? Followed by  ? Derrill Memo ON 07/18/2021] LORazepam (ATIVAN) tablet 1 mg  1 mg Oral TID Briant Cedar, MD      ? Followed by  ? Derrill Memo ON 07/19/2021] LORazepam (ATIVAN) tablet 1 mg  1 mg Oral BID Briant Cedar, MD      ? Followed by  ? Derrill Memo ON 07/21/2021] LORazepam (ATIVAN) tablet 1 mg  1 mg Oral Daily Pashayan, Redgie Grayer, MD      ? magnesium hydroxide (MILK OF MAGNESIA) suspension 30 mL  30 mL Oral Daily PRN Briant Cedar, MD      ? multivitamin with minerals tablet 1 tablet  1 tablet Oral Daily Briant Cedar, MD   1 tablet at 07/17/21 2440  ? ondansetron (ZOFRAN-ODT) disintegrating tablet 4 mg  4 mg Oral Q6H PRN Briant Cedar, MD   4 mg at 07/17/21 1027  ? thiamine tablet 100 mg  100 mg Oral Daily Briant Cedar, MD   100 mg at 07/17/21 0749  ? traZODone (DESYREL) tablet 50 mg  50 mg Oral QHS PRN Briant Cedar, MD   50 mg at 07/16/21 2102  ? ?PTA Medications: ?Medications Prior to Admission  ?Medication Sig Dispense Refill Last Dose  ? propranolol (INDERAL) 10 MG tablet Take 10 mg by mouth 2 (two) times daily.     ? topiramate (TOPAMAX) 25 MG tablet Take 25 mg by mouth 2 (two) times daily.     ? venlafaxine XR (EFFEXOR-XR) 75 MG 24 hr capsule Take 75 mg by mouth 2 (two) times daily.     ? ? ?Patient Stressors: Financial difficulties   ?Marital or family conflict   ?Medication change or noncompliance   ?Substance abuse   ? ?Patient Strengths: Ability for insight  ?Communication skills  ?Supportive family/friends  ? ?Treatment Modalities: Medication Management, Group therapy, Case management,  ?1 to 1 session with clinician, Psychoeducation, Recreational therapy. ? ? ?Physician Treatment Plan for Primary Diagnosis: <principal problem not specified> ?Long Term Goal(s):    ? ?Short Term Goals:   ? ?Medication Management: Evaluate patient's response, side effects, and tolerance of medication regimen. ? ?Therapeutic Interventions: 1 to 1 sessions, Unit  Group sessions and Medication administration. ? ?Evaluation of Outcomes: Not Met ? ?Physician Treatment Plan for Secondary Diagnosis: Active Problems: ?  MDD (major depressive disorder), recurrent severe, without psychosis ([place]) ? ?Long Term Goal(s):    ? ?Short Term Goals:      ? ?Medication Management: Evaluate patient's response, side effects, and tolerance of medication regimen. ? ?Therapeutic Interventions: 1 to 1 sessions, Unit Group sessions and Medication administration. ? ?Evaluation of Outcomes: Not Met ? ? ?RN Treatment Plan for Primary Diagnosis: <principal problem not specified> ?Long Term Goal(s): Knowledge of disease and therapeutic regimen to maintain health will improve ? ?Short Term Goals: Ability to remain free from injury will improve,  Ability to verbalize frustration and anger appropriately will improve, Ability to demonstrate self-control, Ability to identify and develop effective coping behaviors will improve, and Compliance with prescribed medications will improve ? ?Medication Management: RN will administer medications as ordered by provider, will assess and evaluate patient's response and provide education to patient for prescribed medication. RN will report any adverse and/or side effects to prescribing provider. ? ?Therapeutic Interventions: 1 on 1 counseling sessions, Psychoeducation, Medication administration, Evaluate responses to treatment, Monitor vital signs and CBGs as ordered, Perform/monitor CIWA, COWS, AIMS and Fall Risk screenings as ordered, Perform wound care treatments as ordered. ? ?Evaluation of Outcomes: Not Met ? ? ?LCSW Treatment Plan for Primary Diagnosis: <principal problem not specified> ?Long Term Goal(s): Safe transition to appropriate next level of care at discharge, Engage patient in therapeutic group addressing interpersonal concerns. ? ?Short Term Goals: Engage patient in aftercare planning with referrals and resources, Increase social support, Increase ability to appropriately verbalize feelings, Increase emotional regulation, and Increase skills for wellness and recovery ? ?Therapeutic Interventions: Assess for all discharge needs, 1 to 1 time with Education officer, museum, Explore available resources and support systems, Assess for adequacy in community support network, Educate family and significant other(s) on suicide prevention, Complete Psychosocial Assessment, Interpersonal group therapy. ? ?Evaluation of Outcomes: Not Met ? ? ?Progress in Treatment: ?Attending groups: No. ?Participating in groups: No. ?Taking medication as prescribed: Yes. ?Toleration medication: Yes. ?Family/Significant other contact made: No, will contact:  if provides consent  ?Patient understands diagnosis: Yes. ?Discussing patient identified  problems/goals with staff: Yes. ?Medical problems stabilized or resolved: Yes. ?Denies suicidal/homicidal ideation: Yes. ?Issues/concerns per patient self-inventory: No. ? ? ?New problem(s) identified: No, Describe:  none ? ?New Short Term/Long Term Goal(s): detox, medication management for mood stabilization; elimination of SI thoughts; development of comprehensive mental wellness/sobriety plan ? ?Patient Goals:  "Control over substance use" ? ? ?Discharge Plan or Barriers: Patient recently admitted. CSW will continue to follow and assess for appropriate referrals and possible discharge planning.  ? ? ?Reason for Continuation of Hospitalization: Anxiety ?Depression ?Medication stabilization ?Suicidal ideation ?Withdrawal symptoms ? ?Estimated Length of Stay: 3-5 days ? ?Last 3 [place] Suicide Severity Risk Score: ?[facility] Admission (Current) from 07/16/2021 in [facility] ED from 07/15/2021 in [facility] ED from 10/13/2020 in [facility]  ?C-SSRS RISK CATEGORY High Risk High Risk High Risk  ? ?  ? ? ?Last PHQ 2/9 Scores: ? ?  09/05/2020  ?  2:56 AM 06/18/2020  ?  4:19 AM 02/01/2019  ?  2:50 PM  ?Depression screen PHQ 2/9  ?Decreased Interest 2 2 1   ?Down, Depressed, Hopeless 2 2 1   ?PHQ - 2 Score 4 4 2   ?Altered sleeping 2 3 1   ?Tired, decreased  energy 2 2 3   ?Change in appetite 2 3 1   ?Feeling bad or failure about yourself  2 2 1   ?Trouble concentrating 2 3 1   ?Moving slowly or fidgety/restless 2 2 1   ?Suicidal thoughts 2 2 1   ?PHQ-9 Score 18 21 11   ?Difficult doing work/chores Very difficult Very difficult   ? ? ?Scribe for Treatment Team: ?Vassie Moselle, LCSW ?07/17/2021 ?11:12 AM ? ? ?

## 2021-07-17 NOTE — Group Note (Signed)
Date:  07/17/2021 ?Time:  10:41 AM ? ?Group Topic/Focus:  ?Orientation:   The focus of this group is to educate the patient on the purpose and policies of crisis stabilization and provide a format to answer questions about their admission.  The group details unit policies and expectations of patients while admitted. ? ? ? ?Participation Level:  Did Not Attend ? ?Participation Quality:   ? ?Affect:   ? ?Cognitive:   ? ?Insight:  ? ?Engagement in Group:   ? ?Modes of Intervention:   ?Additional Comments:   ? ?Garvin Fila ?07/17/2021, 10:41 AM ? ?

## 2021-07-17 NOTE — Group Note (Signed)
LCSW Group Therapy Note ? ? ?Group Date: 07/17/2021 ?Start Time: 1300 ?End Time: 1400 ? ?Type of Therapy and Topic:  Group Therapy - Healthy vs Unhealthy Coping Skills ? ?Participation Level:  Active  ? ?Description of Group ?The focus of this group was to determine what unhealthy coping techniques typically are used by group members and what healthy coping techniques would be helpful in coping with various problems. Patients were guided in becoming aware of the differences between healthy and unhealthy coping techniques. Patients were asked to identify 2-3 healthy coping skills they would like to learn to use more effectively. ? ?Therapeutic Goals ?Patients learned that coping is what human beings do all day long to deal with various situations in their lives ?Patients defined and discussed healthy vs unhealthy coping techniques ?Patients identified their preferred coping techniques and identified whether these were healthy or unhealthy ?Patients determined 2-3 healthy coping skills they would like to become more familiar with and use more often. ?Patients provided support and ideas to each other ? ? ?Summary of Patient Progress:  Groups did not occur due to staffing challenges.  A packet that included worksheets and information regarding coping skills was provided to the Pt.  The Pt was given time to ask questions and express any concerns with the CSW.   ? ? ?Therapeutic Modalities ?Cognitive Behavioral Therapy ?Motivational Interviewing ? ?Darleen Crocker, LCSWA ?07/17/2021  1:49 PM   ? ?

## 2021-07-17 NOTE — Progress Notes (Addendum)
?  Pt presents with moderate anxiety and depression.  Pt is more alert, but continues to show signs of withdrawal.  Pt denies SI/HI, and verbally contracts for safety.  Pt denies AVH. ? ?Pt administered PRN Hydroxyzine and Trazodone for anxiety and help with sleep per [facility] per pt request. ? ?Pt is safe on the unit with Q 15 min safety checks. ? ? ? ? 07/17/21 2200  ?Psych Admission Type (Psych Patients Only)  ?Admission Status Voluntary  ?Psychosocial Assessment  ?Patient Complaints Anxiety;Depression  ?Eye Contact Fair  ?Facial Expression Anxious  ?Affect Anxious;Depressed  ?Speech Logical/coherent  ?Interaction Assertive  ?Motor Activity Slow;Other (Comment) ?(Pt walks with care)  ?Appearance/Hygiene Disheveled  ?Behavior Characteristics Cooperative;Anxious  ?Mood Depressed;Anxious;Pleasant  ?Thought Process  ?Coherency WDL  ?Content WDL  ?Delusions None reported or observed  ?Perception WDL  ?Hallucination None reported or observed  ?Judgment Poor  ?Confusion None  ?Danger to Self  ?Current suicidal ideation? Denies  ?Self-Injurious Behavior No self-injurious ideation or behavior indicators observed or expressed   ?Agreement Not to Harm Self Yes  ?Description of Agreement verbal contract for safety  ?Danger to Others  ?Danger to Others None reported or observed  ? ? ?

## 2021-07-17 NOTE — Plan of Care (Signed)
?  Problem: Education: Goal: Emotional status will improve Outcome: Not Progressing Goal: Mental status will improve Outcome: Not Progressing   Problem: Coping: Goal: Ability to verbalize frustrations and anger appropriately will improve Outcome: Not Progressing   

## 2021-07-17 NOTE — BHH Group Notes (Signed)
Adult Psychoeducational Group Note ? ?Date:  07/17/2021 ?Time:  2:48 PM ? ?Group Topic/Focus:  ?Wellness Toolbox:   The focus of this group is to discuss various aspects of wellness, balancing those aspects and exploring ways to increase the ability to experience wellness.  Patients will create a wellness toolbox for use upon discharge. ? ?Participation Level:  Minimal ? ?Participation Quality:  Attentive ? ?Affect:  Appropriate ? ?Cognitive:  Appropriate ? ?Insight: Appropriate ? ?Engagement in Group:  Engaged ? ?Modes of Intervention:  Activity ? ?Additional Comments:  Patient attended and participated in relaxation group activity. ? ?Annie Sable ?07/17/2021, 2:48 PM ?

## 2021-07-17 NOTE — Plan of Care (Signed)
?  Problem: Education: ?Goal: Verbalization of understanding the information provided will improve ?Outcome: Progressing ?  ?Problem: Health Behavior/Discharge Planning: ?Goal: Compliance with treatment plan for underlying cause of condition will improve ?Outcome: Progressing ?  ?Problem: Safety: ?Goal: Periods of time without injury will increase ?Outcome: Progressing ?  ?Problem: Self-Concept: ?Goal: Ability to disclose and discuss suicidal ideas will improve ?Outcome: Progressing ?  ?

## 2021-07-17 NOTE — Progress Notes (Signed)
?   07/16/21 2034 07/17/21 0000 07/17/21 0600  ?CIWA-Ar  ?BP (!) 170/86 (!) 160/80 (!) 164/93  ?Pulse Rate 97  --   --   ?Nausea and Vomiting 1 0 2  ?Tactile Disturbances 0 0 0  ?Tremor 4 0 5  ?Auditory Disturbances 0 0 0  ?Paroxysmal Sweats 4 0 3  ?Visual Disturbances 0 0 0  ?Anxiety 3 0 3  ?Headache, Fullness in Head 1 0 2  ?Agitation 0 0 0  ?Orientation and Clouding of Sensorium 0 0 0  ?CIWA-Ar Total 13 0 15  ? ?Prn Ativan for CiwA, Zofran for nausea,Tylenol for Headache given. Patient in bed c/o of feeling dizzy when ambulating. Patient encouraged PO fluids and is compliant. Support and encouragement provided. ?

## 2021-07-17 NOTE — Progress Notes (Addendum)
Patient mostly in room throughout day but did come out to cafeteria for meals today. Patients gait slow and tends to hold on to rails at times. Patient with flat affect but cooperative and med compliant. Patient currently denies SI, HI, and A/V/H. Patient does state feeling depressed and wants to continue getting help detoxing. Patient is also concern with finding a place to stay since he was kicked out from his sister's house. Patient anxious at times but states medications have been helping. No s/s of current distress with CIWA scores remaining <8 thus far this shift.   ? ? 07/17/21 0800  ?Psych Admission Type (Psych Patients Only)  ?Admission Status Voluntary  ?Psychosocial Assessment  ?Patient Complaints Anxiety;Depression  ?Eye Contact Fair  ?Facial Expression Anxious  ?Affect Anxious;Depressed  ?Speech Logical/coherent  ?Interaction Assertive  ?Motor Activity Slow  ?Appearance/Hygiene Disheveled  ?Behavior Characteristics Cooperative;Anxious  ?Mood Depressed;Anxious  ?Thought Process  ?Coherency WDL  ?Content WDL  ?Delusions None reported or observed  ?Perception WDL  ?Hallucination None reported or observed  ?Judgment Poor  ?Confusion None  ?Danger to Self  ?Current suicidal ideation? Denies  ?Self-Injurious Behavior No self-injurious ideation or behavior indicators observed or expressed   ?Agreement Not to Harm Self Yes  ?Description of Agreement verbal  ?Danger to Others  ?Danger to Others None reported or observed  ? ? ?

## 2021-07-18 ENCOUNTER — Encounter (HOSPITAL_COMMUNITY): Payer: Self-pay | Admitting: Student in an Organized Health Care Education/Training Program

## 2021-07-18 MED ORDER — GABAPENTIN 100 MG PO CAPS
100.0000 mg | ORAL_CAPSULE | Freq: Three times a day (TID) | ORAL | Status: DC
  Administered 2021-07-18 – 2021-07-23 (×14): 100 mg via ORAL
  Filled 2021-07-18 (×5): qty 1
  Filled 2021-07-18: qty 21
  Filled 2021-07-18: qty 1
  Filled 2021-07-18: qty 21
  Filled 2021-07-18 (×12): qty 1
  Filled 2021-07-18: qty 21

## 2021-07-18 NOTE — Plan of Care (Signed)
?  Problem: Activity: ?Goal: Sleeping patterns will improve ?Outcome: Progressing ?  ?Problem: Education: ?Goal: Emotional status will improve ?Outcome: Not Progressing ?Goal: Mental status will improve ?Outcome: Not Progressing ?  ?

## 2021-07-18 NOTE — BHH Group Notes (Signed)
.  Psychoeducational Group Note ? ? ? ?Date:  4/29//23 ?Time: 1300-1400 ? ? ? ?Purpose of Group: . The group focus' on teaching patients on how to identify their needs and their Life Skills:  A group where two lists are made. What people need and what are things that we do that are unhealthy. The lists are developed by the patients and it is explained that we often do the actions that are not healthy to get our list of needs met. ? ?Goal:: to develop the coping skills needed to get their needs met ? ?Participation Level:  Active ? ?Participation Quality:  Appropriate ? ?Affect:  Appropriate ? ?Cognitive:  Oriented ? ?Insight:  Improving ? ?Engagement in Group:  Engaged ? ?Additional Comments: Rates energy at a 3/10. Participated in the group. ? ?Bryson Dames A ? ?

## 2021-07-18 NOTE — Progress Notes (Signed)
?  Pt presents with moderate anxiety and high depression. Pt reports feeling low today.  Pt states, "I can't go back to [place]."  Pt reports he wants to go somewhere that can help him get back on his feet." Pt reports one of his peers spoke about a place that provided help with small needs like help with food, and cigarettes, a job, and give you $100 to manage weekly.  Pt denies SI, and verbally contracts for safety.  Pt endorses passive HI for Johnny in [place], his sister's 6 y.o. boyfriend, but he is not going back to jail for something like that. Pt reports this is why he is not going back to [place]. ? ?Vitals were obtained.  BP 153/86, Pulse 77.  BP Recheck= 136/73, Pulse 76.  Administered PRN Hydroxyzine and Trazodone for anxiety and help with sleep per [facility] per pt request. ? ?Pt is safe on the unit with Q 15 minute safety checks.  ? ? ? 07/18/21 2050  ?Psych Admission Type (Psych Patients Only)  ?Admission Status Voluntary  ?Psychosocial Assessment  ?Patient Complaints Anxiety;Depression  ?Eye Contact Fair  ?Facial Expression Anxious  ?Affect Anxious;Depressed  ?Speech Logical/coherent  ?Interaction Assertive  ?Motor Activity Slow  ?Appearance/Hygiene Disheveled  ?Behavior Characteristics Cooperative;Anxious  ?Mood Depressed;Anxious  ?Thought Process  ?Coherency WDL  ?Content WDL  ?Delusions None reported or observed  ?Perception WDL  ?Hallucination None reported or observed  ?Judgment Poor  ?Confusion None  ?Danger to Self  ?Current suicidal ideation? Denies  ?Self-Injurious Behavior No self-injurious ideation or behavior indicators observed or expressed   ?Agreement Not to Harm Self Yes  ?Description of Agreement verbal contract for safety  ?Danger to Others  ?Danger to Others None reported or observed  ? ? ?

## 2021-07-18 NOTE — Progress Notes (Signed)
paAdult Psychoeducational Group Note ? ?Date:  07/18/2021 ?Time:  10:00 PM ? ?Group Topic/Focus:  ?Wrap-Up Group:   The focus of this group is to help patients review their daily goal of treatment and discuss progress on daily workbooks. ? ?Participation Level:  Active ? ?Participation Quality:  Appropriate ? ?Affect:  Appropriate ? ?Cognitive:  Appropriate ? ?Insight: Appropriate ? ?Engagement in Group:  Engaged ? ?Modes of Intervention:  Education and Exploration ? ?Additional Comments:  Patient attended and participated in group tonight.  He reports that he learnt today that his liver is in better shape that what he thought ? ?Debe Coder ?07/18/2021, 10:00 PM ?

## 2021-07-18 NOTE — BHH Group Notes (Signed)
Goals Group ?4/29//2023 ? ? ?Group Focus: affirmation, clarity of thought, and goals/reality orientation ?Treatment Modality:  Psychoeducation ?Interventions utilized were assignment, group exercise, and support ?Purpose: To be able to understand and verbalize the reason for their admission to the hospital. To understand that the medication helps with their chemical imbalance but they also need to work on their choices in life. To be challenged to develop a list of 30 positives about themselves. Also introduce the concept that "feelings" are not reality. ? ?Participation Level:  Active ? ?Participation Quality:  Appropriate ? ?Affect:  Appropriate ? ?Cognitive:  Appropriate ? ?Insight:  Improving ? ?Engagement in Group:  Engaged ? ?Additional Comments:  Rates his energy at a 2/10. Participated fully in the group. ? ?Barry Moore A ?

## 2021-07-18 NOTE — Progress Notes (Signed)
[facility] MD Progress Note ? ?07/18/2021 3:40 PM ?Barry Moore  ?MRN:  [id number] ? ?Subjective:  "I feel bad because I am coming off the alcohol, and I am shaky and drugy." ? ?Brief History:  Barry Moore is a 56 year old Caucasian male observed resting in bed.  He reported he was transferred to this behavioral health facility due to homicidal ideation towards his brother-in-law. "  We just do not like each other."  Reports a history of depression, anxiety and suicidal ideations.  He reports his substance abuse history to alcohol as well.  Kendric reported drinking 40 ounces of beer 3-4 times daily.  Reports he struggled with depression and anxiety in the past. Stated " I just do not feel good today, I want to lay in bed all day. "  denied that he has been followed by therapy or psychiatry in the past.  Denied that he is supposed to take any psychotropic medications chart reviewed Blood TOX for Alcohol, Ethyl 415 on admission. patient was placed on Ativan detox protocol.  Continues to report headaches and nausea.  ? ?Daily Notes 07/18/21: Patient is seen and examined in the office. Chart reviewed and findings shared with the Tx team and discussed with Dr. Nelda Marseille. Alert and oriented to place, time person and situation. Speech clear and with normal volume for patient. Mood/Affect appropriate, anxious and depressed. Thought process coherent and linear. Thought content WNL for patient. Memory, judgement and insight fair. Actively participating in therapeutic milieu and group therapy. Obvious Tremors to both hands and fingers. Neurontin 100 mg po TID ordered for nerve pain. Continues on detox protocol. ? ?Patient rated anxiety as "8" and depression as "8" on a scale of 0 to 10. Denied SI/AVH. However, endorsed  HI towards his sister's boyfriend. Endorses sleeping for 4 hours and consuming 50% of his meals. Reported no adverse reaction from medications. Denied headache or nausea nor vomiting. Will continue to assess for  alcohol withdrawal symptoms. ? ?Principal Problem: MDD (major depressive disorder), recurrent severe, without psychosis ([place]) ?Diagnosis: Principal Problem: ?  MDD (major depressive disorder), recurrent severe, without psychosis ([place]) ? ?Total Time spent with patient: 30 minutes ? ?Past Psychiatric History: MDD, Homelessness, Alcohol Abuse and dependent, HI, SI. ? ?Past Medical History:  ?Past Medical History:  ?Diagnosis Date  ? Alcoholic hepatitis   ? Depression   ? ETOH abuse   ? Gastric bleed 08/2018  ? Hypertension   ? Pancreatitis   ? Seizures ([place])   ? Suicidal behavior   ?  ?Past Surgical History:  ?Procedure Laterality Date  ? BIOPSY  08/05/2018  ? Procedure: BIOPSY;  Surgeon: Irving Copas., MD;  Location: [place];  Service: Gastroenterology;;  ? BIOPSY  08/07/2018  ? Procedure: BIOPSY;  Surgeon: Thornton Park, MD;  Location: [place];  Service: Gastroenterology;;  ? BIOPSY  01/02/2019  ? Procedure: BIOPSY;  Surgeon: Gatha Mayer, MD;  Location: [facility];  Service: Endoscopy;;  ? COLONOSCOPY WITH PROPOFOL N/A 08/07/2018  ? Procedure: COLONOSCOPY WITH PROPOFOL;  Surgeon: Thornton Park, MD;  Location: [place];  Service: Gastroenterology;  Laterality: N/A;  ? ESOPHAGOGASTRODUODENOSCOPY N/A 01/02/2019  ? Procedure: ESOPHAGOGASTRODUODENOSCOPY (EGD);  Surgeon: Gatha Mayer, MD;  Location: [facility];  Service: Endoscopy;  Laterality: N/A;  ? ESOPHAGOGASTRODUODENOSCOPY N/A 07/03/2019  ? Procedure: ESOPHAGOGASTRODUODENOSCOPY (EGD);  Surgeon: Wonda Horner, MD;  Location: [facility];  Service: Endoscopy;  Laterality: N/A;  ? ESOPHAGOGASTRODUODENOSCOPY N/A 06/26/2020  ? Procedure: ESOPHAGOGASTRODUODENOSCOPY (EGD);  Surgeon: Arta Silence, MD;  Location: [facility];  Service: Endoscopy;  Laterality: N/A;  ? ESOPHAGOGASTRODUODENOSCOPY (EGD) WITH PROPOFOL N/A 11/02/2016  ? Procedure: ESOPHAGOGASTRODUODENOSCOPY (EGD) WITH PROPOFOL;  Surgeon: Carol Ada, MD;  Location: [facility];  Service: Endoscopy;  Laterality: N/A;  ? ESOPHAGOGASTRODUODENOSCOPY (EGD) WITH PROPOFOL N/A 08/05/2018  ? Procedure: ESOPHAGOGASTRODUODENOSCOPY (EGD) WITH PROPOFOL;  Surgeon: Rush Landmark Telford Nab., MD;  Location: [place];  Service: Gastroenterology;  Laterality: N/A;  ? ESOPHAGOGASTRODUODENOSCOPY (EGD) WITH PROPOFOL N/A 07/14/2019  ? Procedure: ESOPHAGOGASTRODUODENOSCOPY (EGD) WITH PROPOFOL;  Surgeon: Otis Brace, MD;  Location: [facility];  Service: Gastroenterology;  Laterality: N/A;  ? HERNIA REPAIR    ? LEG SURGERY    ? POLYPECTOMY  08/07/2018  ? Procedure: POLYPECTOMY;  Surgeon: Thornton Park, MD;  Location: [place];  Service: Gastroenterology;;  ? ?Family History:  ?Family History  ?Problem Relation Age of Onset  ? Diabetes Mother   ? Alcoholism Mother   ? Emphysema Father   ? Lung cancer Father   ? Alcoholism Father   ? ?Family Psychiatric  History: None indicated ?Social History:  ?Social History  ? ?Substance and Sexual Activity  ?Alcohol Use Yes  ? Comment: 5+ BEERS DAILY, Patient does not know how much he drank tonight  ?   ?Social History  ? ?Substance and Sexual Activity  ?Drug Use Not Currently  ? Frequency: 3.0 times per week  ? Types: Cocaine  ?  ?Social History  ? ?Socioeconomic History  ? Marital status: Divorced  ?  Spouse name: Not on file  ? Number of children: Not on file  ? Years of education: Not on file  ? Highest education level: Not on file  ?Occupational History  ? Not on file  ?Tobacco Use  ? Smoking status: Every Day  ?  Packs/day: 1.00  ?  Types: Cigarettes  ? Smokeless tobacco: Never  ?Vaping Use  ? Vaping Use: Never used  ?Substance and Sexual Activity  ? Alcohol use: Yes  ?  Comment: 5+ BEERS DAILY, Patient does not know how much he drank tonight  ? Drug use: Not Currently  ?  Frequency: 3.0 times per week  ?  Types: Cocaine  ? Sexual activity: Not Currently  ?Other Topics Concern  ? Not on file  ?Social History Narrative  ? Not on file  ? ?Social  Determinants of Health  ? ?Financial Resource Strain: Not on file  ?Food Insecurity: Not on file  ?Transportation Needs: Not on file  ?Physical Activity: Not on file  ?Stress: Not on file  ?Social Connections: Not on file  ? ?Additional Social History:  ?  ?Sleep: Fair ? ?Appetite:  Good ? ?Current Medications: ?Current Facility-Administered Medications  ?Medication Dose Route Frequency Provider Last Rate Last Admin  ? acetaminophen (TYLENOL) tablet 650 mg  650 mg Oral Q6H PRN Briant Cedar, MD   650 mg at 07/17/21 7096  ? alum & mag hydroxide-simeth (MAALOX/MYLANTA) 200-200-20 MG/5ML suspension 30 mL  30 mL Oral Q4H PRN Briant Cedar, MD      ? chlordiazePOXIDE (LIBRIUM) capsule 25 mg  25 mg Oral TID Derrill Center, NP   25 mg at 07/18/21 1133  ? Followed by  ? [START ON 07/19/2021] chlordiazePOXIDE (LIBRIUM) capsule 25 mg  25 mg Oral Bennye Alm, NP      ? Followed by  ? [START ON 07/21/2021] chlordiazePOXIDE (LIBRIUM) capsule 25 mg  25 mg Oral Daily Derrill Center, NP      ? chlordiazePOXIDE (LIBRIUM)  capsule 25 mg  25 mg Oral Q6H PRN Derrill Center, NP   25 mg at 07/18/21 1133  ? cloNIDine (CATAPRES) tablet 0.1 mg  0.1 mg Oral Q8H PRN Nelda Marseille, Amy E, MD   0.1 mg at 07/17/21 0800  ? hydrOXYzine (ATARAX) tablet 25 mg  25 mg Oral Q6H PRN Briant Cedar, MD   25 mg at 07/17/21 2153  ? loperamide (IMODIUM) capsule 2-4 mg  2-4 mg Oral PRN Briant Cedar, MD      ? LORazepam (ATIVAN) tablet 1 mg  1 mg Oral Q6H PRN Briant Cedar, MD   1 mg at 07/17/21 0630  ? magnesium hydroxide (MILK OF MAGNESIA) suspension 30 mL  30 mL Oral Daily PRN Briant Cedar, MD      ? multivitamin with minerals tablet 1 tablet  1 tablet Oral Daily Briant Cedar, MD   1 tablet at 07/18/21 1601  ? ondansetron (ZOFRAN-ODT) disintegrating tablet 4 mg  4 mg Oral Q6H PRN Briant Cedar, MD   4 mg at 07/17/21 0932  ? sertraline (ZOLOFT) tablet 25 mg  25 mg Oral Daily Derrill Center, NP   25 mg at 07/18/21 3557  ? thiamine tablet 100 mg  100 mg Oral Daily Briant Cedar, MD   100 mg at 07/18/21 3220  ? traZODone (DESYREL) tablet 50 mg  50 mg Oral QHS PRN Pashayan, Alexande

## 2021-07-18 NOTE — Progress Notes (Signed)
D. Pt presents with a depressed affect/ mood, calm and cooperative behavior, rated his depression, hopelessness and anxiety a 9/9/8, respectively. Pt rated his energy level and concentration poor, and complained of moderate withdrawal symptoms earlier in the shift. Pt reported that his goal was "to work on feeling better." Pt currently denies SI/HI and AVH and does not appear to be responding to internal stimuli.  ?A. Labs and vitals monitored. Pt given and educated on scheduled and prn medications. Pt supported emotionally and encouraged to express concerns and ask questions.   ?R. Pt remains safe with 15 minute checks. Will continue POC. ? ?  ?

## 2021-07-18 NOTE — Group Note (Signed)
LCSW Group Therapy Note ? ?No social work group was held today due to staffing of one Education officer, museum on Adult unit and  high number of admissions that required initial psychosocial assessments.  The following was provided to the patient in lieu of in-person group: ? ?Healthy vs. Unhealthy Supports and Coping Skills ? ? 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   ?   ? ?My Unhealthy Coping Skills                                    My Healthy Coping Skills ?   ?   ?   ?   ?   ?   ?   ? ?My Unhealthy Supports                                           My Healthy Supports ?   ?   ?   ?   ?   ?   ?   ? ? ? ? ? ?Use the stationery provided to write a Goodbye Letter to one of your unhealthy coping skills or unhealthy supports. ? ?Share with other patients as you desire. ? ?Selmer Dominion, LCSW ?07/18/2021  ?3:37 PM  ?   ?

## 2021-07-19 LAB — HEPATITIS PANEL, ACUTE
HCV Ab: NONREACTIVE
Hep A IgM: NONREACTIVE
Hep B C IgM: NONREACTIVE
Hepatitis B Surface Ag: NONREACTIVE

## 2021-07-19 NOTE — Progress Notes (Signed)
?   07/19/21 2208  ?Psych Admission Type (Psych Patients Only)  ?Admission Status Voluntary  ?Psychosocial Assessment  ?Patient Complaints Anxiety;Substance abuse  ?Eye Contact Fair  ?Facial Expression Sad  ?Affect Anxious  ?Speech Logical/coherent  ?Interaction Assertive  ?Motor Activity Slow  ?Appearance/Hygiene Unremarkable  ?Behavior Characteristics Cooperative  ?Mood Pleasant  ?Thought Process  ?Delusions None reported or observed  ?Perception WDL  ?Hallucination None reported or observed  ?Judgment Poor  ?Confusion None  ?Danger to Self  ?Current suicidal ideation? Denies  ?Self-Injurious Behavior No self-injurious ideation or behavior indicators observed or expressed   ?Agreement Not to Harm Self Yes  ?Description of Agreement verbal contract  ?Danger to Others  ?Danger to Others None reported or observed  ? ?D: Patient in dayroom reports he had a better day. Pt reports minimal withdrawal symptoms this evening. ?A: Medications administered as prescribed. Support and encouragement provided as needed. Educated pt to reports symptoms as they arise. ?R: Patient remains safe on the unit. Will continue to monitor for safety and stability.   ?

## 2021-07-19 NOTE — Group Note (Signed)
[facility] LCSW Group Therapy Note ? ?Date/Time:  07/19/2021   ? ?Type of Therapy and Topic:  Group Therapy:  Using Music to Encourage Yourself ? ?Participation Level:  None  ? ?Description of Group: ?In this process group, members listened to a variety of music through choosing from CSW's list #1 through #25.  Patients identified the messages received from those songs and how the music affected their emotions.  Patients were encouraged to use music as a coping skill at home, but to be mindful of the choices made.  Patients discussed how this knowledge can help with wellness and recovery in various ways including managing depression and anxiety as well as encouraging healthy sleep habits.   ? ?Therapeutic Goals: ?Patients will explore the impact of different songs on mood ?Patients will verbalize the thoughts they have when listening to different types of music ?Patients will identify music that is soothing to them as well as music that is energizing to them ?Patients will discuss how to use this knowledge to assist in maintaining wellness and recovery ?Patients will explore the use of music as a coping skill ?Patients will encourage one another ? ?Summary of Patient Progress:  The patient did not arrive until the last few minutes of group, did not participate. ? ?Therapeutic Modalities: ?Solution Focused Brief Therapy ?Activity ? ? ?Selmer Dominion, LCSW ?  ?

## 2021-07-19 NOTE — BHH Group Notes (Signed)
Adult Psychoeducational Group  ?Date:  07/19/2021 ?Time:  1300-1400 ? ?Group Topic/Focus: Continuation of the group from Saturday. Looking at the lists that were created and talking about what needs to be done with the homework of 30 positives about themselves.  ?                                   Talking about taking their power back and helping themselves to develop a positive self esteem. ?     ?Participation Quality:  Appropriate ? ?Affect:  Appropriate ? ?Cognitive:  Oriented ? ?Insight: Improving ? ?Engagement in Group:  Engaged ? ?Modes of Intervention:  Activity, Discussion, Education, and Support ? ?Additional Comments:  Rates energy at a 7/10. Participated in the group fully. ? ?Bryson Dames A ? ? ?

## 2021-07-19 NOTE — Progress Notes (Signed)
[facility] MD Progress Note ? ?07/19/2021 3:51 PM ?Barry Moore  ?MRN:  [id number] ? ?Subjective:  "Last night I was sweaty, shaky, and had bad dreams." ? ?Brief History:  Barry Moore is a 56 year old Caucasian male observed resting in bed. He reported he was transferred to this behavioral health facility due to homicidal ideation towards his brother-in-law. "  We just do not like each other."  Reports a history of depression, anxiety and suicidal ideations.  He reports his substance abuse history to alcohol as well.  Gilmore reported drinking 40 ounces of beer 3-4 times daily.  Reports he struggled with depression and anxiety in the past. Stated " I just do not feel good today, I want to lay in bed all day."  Denied that he has been followed by therapy or psychiatry in the past.  Denied that he is supposed to take any psychotropic medications. Chart reviewed Blood TOX for Alcohol, Ethyl 415 on admission. Patient was placed on Ativan detox protocol.  Continued to report headaches and nausea.  ? ?Daily Notes 07/19/21: Patient is seen and examined in the office. Chart reviewed and findings shared with the Tx team and discussed with Dr. Nelda Marseille. Alert and oriented to place, time person and situation. Speech clear and with normal volume for patient. Mood/Affect appropriate, anxious and depressed. Thought process coherent and linear. Thought content WNL for patient. Memory, judgement and insight fair. Actively participating in therapeutic milieu and group therapy. Only fine tremors to  fingers observed during this encounter. Continues on Neurontin 100 mg po TID for nerve pain with stated relief. ?Patient rated anxiety as "5" and depression as "7" on a scale of 0 to 10. Denied SI/HI/AVH. Stated, no more HI towards my sister's boyfriend. Endorses sleeping for 5 hours and consuming 60% of his meals. Reported no adverse reaction from medications. Denied headache, nausea nor vomiting. Will continue to assess for alcohol  withdrawal symptoms. ? ?Principal Problem: MDD (major depressive disorder), recurrent severe, without psychosis ([place]) ?Diagnosis: Principal Problem: ?  MDD (major depressive disorder), recurrent severe, without psychosis ([place]) ? ?Total Time spent with patient: 30 minutes ? ?Past Psychiatric History: MDD, Homelessness, Alcohol Abuse and dependent, HI, SI. ? ?Past Medical History:  ?Past Medical History:  ?Diagnosis Date  ? Alcoholic hepatitis   ? Depression   ? ETOH abuse   ? Gastric bleed 08/2018  ? Hypertension   ? Pancreatitis   ? Seizures ([place])   ? Suicidal behavior   ?  ?Past Surgical History:  ?Procedure Laterality Date  ? BIOPSY  08/05/2018  ? Procedure: BIOPSY;  Surgeon: Irving Copas., MD;  Location: [place];  Service: Gastroenterology;;  ? BIOPSY  08/07/2018  ? Procedure: BIOPSY;  Surgeon: Thornton Park, MD;  Location: [place];  Service: Gastroenterology;;  ? BIOPSY  01/02/2019  ? Procedure: BIOPSY;  Surgeon: Gatha Mayer, MD;  Location: [facility];  Service: Endoscopy;;  ? COLONOSCOPY WITH PROPOFOL N/A 08/07/2018  ? Procedure: COLONOSCOPY WITH PROPOFOL;  Surgeon: Thornton Park, MD;  Location: [place];  Service: Gastroenterology;  Laterality: N/A;  ? ESOPHAGOGASTRODUODENOSCOPY N/A 01/02/2019  ? Procedure: ESOPHAGOGASTRODUODENOSCOPY (EGD);  Surgeon: Gatha Mayer, MD;  Location: [facility];  Service: Endoscopy;  Laterality: N/A;  ? ESOPHAGOGASTRODUODENOSCOPY N/A 07/03/2019  ? Procedure: ESOPHAGOGASTRODUODENOSCOPY (EGD);  Surgeon: Wonda Horner, MD;  Location: [facility];  Service: Endoscopy;  Laterality: N/A;  ? ESOPHAGOGASTRODUODENOSCOPY N/A 06/26/2020  ? Procedure: ESOPHAGOGASTRODUODENOSCOPY (EGD);  Surgeon: Arta Silence, MD;  Location: [facility];  Service: Endoscopy;  Laterality: N/A;  ? ESOPHAGOGASTRODUODENOSCOPY (EGD) WITH PROPOFOL N/A 11/02/2016  ? Procedure: ESOPHAGOGASTRODUODENOSCOPY (EGD) WITH PROPOFOL;  Surgeon: Carol Ada, MD;  [facility]: [facility];   Service: Endoscopy;  Laterality: N/A;  ? ESOPHAGOGASTRODUODENOSCOPY (EGD) WITH PROPOFOL N/A 08/05/2018  ? Procedure: ESOPHAGOGASTRODUODENOSCOPY (EGD) WITH PROPOFOL;  Surgeon: Rush Landmark Telford Nab., MD;  Location: [place];  Service: Gastroenterology;  Laterality: N/A;  ? ESOPHAGOGASTRODUODENOSCOPY (EGD) WITH PROPOFOL N/A 07/14/2019  ? Procedure: ESOPHAGOGASTRODUODENOSCOPY (EGD) WITH PROPOFOL;  Surgeon: Otis Brace, MD;  Location: [facility];  Service: Gastroenterology;  Laterality: N/A;  ? HERNIA REPAIR    ? LEG SURGERY    ? POLYPECTOMY  08/07/2018  ? Procedure: POLYPECTOMY;  Surgeon: Thornton Park, MD;  Location: [place];  Service: Gastroenterology;;  ? ?Family History:  ?Family History  ?Problem Relation Age of Onset  ? Diabetes Mother   ? Alcoholism Mother   ? Emphysema Father   ? Lung cancer Father   ? Alcoholism Father   ? ?Family Psychiatric  History: None indicated ?Social History:  ?Social History  ? ?Substance and Sexual Activity  ?Alcohol Use Yes  ? Comment: 5+ BEERS DAILY, Patient does not know how much he drank tonight  ?   ?Social History  ? ?Substance and Sexual Activity  ?Drug Use Not Currently  ? Frequency: 3.0 times per week  ? Types: Cocaine  ?  ?Social History  ? ?Socioeconomic History  ? Marital status: Divorced  ?  Spouse name: Not on file  ? Number of children: Not on file  ? Years of education: Not on file  ? Highest education level: Not on file  ?Occupational History  ? Not on file  ?Tobacco Use  ? Smoking status: Every Day  ?  Packs/day: 1.00  ?  Types: Cigarettes  ? Smokeless tobacco: Never  ?Vaping Use  ? Vaping Use: Never used  ?Substance and Sexual Activity  ? Alcohol use: Yes  ?  Comment: 5+ BEERS DAILY, Patient does not know how much he drank tonight  ? Drug use: Not Currently  ?  Frequency: 3.0 times per week  ?  Types: Cocaine  ? Sexual activity: Not Currently  ?Other Topics Concern  ? Not on file  ?Social History Narrative  ? Not on file  ? ?Social Determinants  of Health  ? ?Financial Resource Strain: Not on file  ?Food Insecurity: Not on file  ?Transportation Needs: Not on file  ?Physical Activity: Not on file  ?Stress: Not on file  ?Social Connections: Not on file  ? ?Additional Social History:  ?  ?Sleep: Fair ? ?Appetite:  Good ? ?Current Medications: ?Current Facility-Administered Medications  ?Medication Dose Route Frequency Provider Last Rate Last Admin  ? acetaminophen (TYLENOL) tablet 650 mg  650 mg Oral Q6H PRN Briant Cedar, MD   650 mg at 07/17/21 1478  ? alum & mag hydroxide-simeth (MAALOX/MYLANTA) 200-200-20 MG/5ML suspension 30 mL  30 mL Oral Q4H PRN Briant Cedar, MD      ? chlordiazePOXIDE (LIBRIUM) capsule 25 mg  25 mg Oral Bennye Alm, NP      ? Followed by  ? [START ON 07/21/2021] chlordiazePOXIDE (LIBRIUM) capsule 25 mg  25 mg Oral Daily Derrill Center, NP      ? chlordiazePOXIDE (LIBRIUM) capsule 25 mg  25 mg Oral Q6H PRN Derrill Center, NP   25 mg at 07/18/21 1133  ? cloNIDine (CATAPRES) tablet 0.1 mg  0.1 mg Oral Q8H PRN Harlow Asa, MD  0.1 mg at 07/17/21 0800  ? gabapentin (NEURONTIN) capsule 100 mg  100 mg Oral TID Laretta Bolster, FNP   100 mg at 07/19/21 1306  ? hydrOXYzine (ATARAX) tablet 25 mg  25 mg Oral Q6H PRN Briant Cedar, MD   25 mg at 07/19/21 5726  ? loperamide (IMODIUM) capsule 2-4 mg  2-4 mg Oral PRN Briant Cedar, MD      ? LORazepam (ATIVAN) tablet 1 mg  1 mg Oral Q6H PRN Briant Cedar, MD   1 mg at 07/17/21 2035  ? magnesium hydroxide (MILK OF MAGNESIA) suspension 30 mL  30 mL Oral Daily PRN Briant Cedar, MD      ? multivitamin with minerals tablet 1 tablet  1 tablet Oral Daily Briant Cedar, MD   1 tablet at 07/19/21 5974  ? ondansetron (ZOFRAN-ODT) disintegrating tablet 4 mg  4 mg Oral Q6H PRN Briant Cedar, MD   4 mg at 07/17/21 1638  ? sertraline (ZOLOFT) tablet 25 mg  25 mg Oral Daily Derrill Center, NP   25 mg at 07/19/21 4536  ? thiamine  tablet 100 mg  100 mg Oral Daily Briant Cedar, MD   100 mg at 07/19/21 4680  ? traZODone (DESYREL) tablet 50 mg  50 mg Oral QHS PRN Briant Cedar, MD   50 mg at 07/18/21 2049  ? ? ?Lab Results:

## 2021-07-19 NOTE — BHH Group Notes (Signed)
Adult Psychoeducational Group Not ?Date:  07/19/2021 ?Time:  [id number] ?Group Topic/Focus: PROGRESSIVE RELAXATION. A group where deep breathing is taught and tensing and relaxation muscle groups is used. Imagery is used as well.  Pts are asked to imagine 3 pillars that hold them up when they are not able to hold themselves up and to share that with the group. ? ?Participation Level:  did not attend ? ?Bryson Dames A ? ?

## 2021-07-19 NOTE — Progress Notes (Signed)
?  Pt reports he awaken anxious this morning with his body drenched in sweat.  Pt states, "I was dreaming that I was using a whole lot of drugs that I don't ever use, like heroin." ? ?Administered PRN Hydroxyzine for anxiety per [facility] per pt request. ?

## 2021-07-19 NOTE — Progress Notes (Signed)
D. Pt presents with a sad affect/ mood- reported upon initial approach that he had "a really rough night" last pm, stating, "I woke up drenched in sweat. I dreamt I was smoking marijuana and I don't even smoke."  Pt has been visible in the milieu interacting well with peers and staff, and observed attending groups.  Pt currently denies SI/HI and AVH .  ?A. Labs and vitals monitored. Pt given and educated on medications. Pt supported emotionally and encouraged to express concerns and ask questions.   ?R. Pt remains safe with 15 minute checks. Will continue POC. ? ?  ?

## 2021-07-20 LAB — CULTURE, BLOOD (ROUTINE X 2)
Culture: NO GROWTH
Culture: NO GROWTH
Special Requests: ADEQUATE

## 2021-07-20 LAB — COMPREHENSIVE METABOLIC PANEL
ALT: 41 U/L (ref 0–44)
AST: 57 U/L — ABNORMAL HIGH (ref 15–41)
Albumin: 4.1 g/dL (ref 3.5–5.0)
Alkaline Phosphatase: 157 U/L — ABNORMAL HIGH (ref 38–126)
Anion gap: 6 (ref 5–15)
BUN: 17 mg/dL (ref 6–20)
CO2: 28 mmol/L (ref 22–32)
Calcium: 9.5 mg/dL (ref 8.9–10.3)
Chloride: 104 mmol/L (ref 98–111)
Creatinine, Ser: 0.79 mg/dL (ref 0.61–1.24)
GFR, Estimated: >60 mL/min (ref >60)
Glucose, Bld: 84 mg/dL (ref 70–99)
Potassium: 4.1 mmol/L (ref 3.5–5.1)
Sodium: 138 mmol/L (ref 135–145)
Total Bilirubin: 0.5 mg/dL (ref 0.3–1.2)
Total Protein: 7.9 g/dL (ref 6.5–8.1)

## 2021-07-20 MED ORDER — SERTRALINE HCL 50 MG PO TABS
50.0000 mg | ORAL_TABLET | Freq: Every day | ORAL | Status: DC
Start: 2021-07-21 — End: 2021-07-23
  Administered 2021-07-21 – 2021-07-23 (×3): 50 mg via ORAL
  Filled 2021-07-20 (×2): qty 1
  Filled 2021-07-20: qty 7
  Filled 2021-07-20 (×2): qty 1

## 2021-07-20 MED ORDER — TRAZODONE HCL 100 MG PO TABS
100.0000 mg | ORAL_TABLET | Freq: Every day | ORAL | Status: DC
  Administered 2021-07-21 – 2021-07-22 (×2): 100 mg via ORAL
  Filled 2021-07-20 (×5): qty 1
  Filled 2021-07-20: qty 7

## 2021-07-20 MED ORDER — TRAZODONE HCL 100 MG PO TABS: 100.0000 mg | ORAL_TABLET | Freq: Every evening | ORAL | Status: DC | PRN

## 2021-07-20 MED ORDER — SERTRALINE HCL 25 MG PO TABS
25.0000 mg | ORAL_TABLET | Freq: Once | ORAL | Status: AC
  Administered 2021-07-20: 25 mg via ORAL
  Filled 2021-07-20: qty 1

## 2021-07-20 NOTE — Group Note (Signed)
LCSW Group Therapy Note ? ? ?Group Date: 07/20/2021 ?Start Time: 1300 ?End Time: 1400 ? ?Type of Therapy and Topic:  Group Therapy: Thoughts, Feelings, and Actions ? ?Participation Level:  Active ? ? ?Description of Group:   ?In this group, each patient discussed their previous experiencing and understanding of overthinking, identifying the harmful impact on their lives. As a group, each patient was introduced to the basic concepts of Cognitive Behavioral Therapy: that thoughts, feelings, and actions are all connected and influence one another. They were given examples of how overthinking can affect our feelings, actions, and vise versa. The group was then asked to analyze how overthinking was harmful and brainstorm alternative thinking patterns/reactions to the example situation. Then, each group member filled out and identified their own example situation in which a problem situation caused their thoughts, feelings, and actions to be negatively impacted; they were asked to come up with 3 new (more adaptive/positive) thoughts that led to 3 new feelings and actions. ? ?Therapeutic Goals: ?Patients will review and discuss their past experience with overthinking. ?Patients will learn the basics of the CBT model through group-led examples.[name] ?Patients will identify situations where they may have negative thoughts, feelings, or actions and will then reframe the situation using more positive thoughts to react differently. ? ?Summary of Patient Progress:  The patient shared that they confused today. Patient contributed to the discussion of how thoughts, feelings, and actions interact, noting when they may have experienced a negative thought pattern and recognized it as harmful. They were attentive when other patients shared their experiences, and worked to reframe their own thoughts in an activity to identify future situations where they may typically overthink. ? ?Therapeutic Modalities:   ?Cognitive Behavioral  Therapy ?Mindfulness ? ?Darleen Crocker, LCSWA ?07/20/2021  2:08 PM   ? ?

## 2021-07-20 NOTE — Progress Notes (Signed)
Cam approached chaplain after group and shared that he has had multiple losses and the only family members who remain alive are 2 sisters who are also experiencing health issues.  He is anticipating that he will soon lose them.  He is trying to set up boundaries because he also does not feel like his sister is in a healthy relationship.  Chaplain provided listening and support. ? ?[employer], Bcc ?Pager, [phone] ?

## 2021-07-20 NOTE — Progress Notes (Addendum)
Pt stated "I feel so much better than I did when I first came in to the hospital."  Pt denies SI, AVH.  Pt endorses having HI towards pt's brother in law. Pt said he has no plan or intent to kill his brother in law, "but the thoughts are still there."  Pt taking medications without incident.  RN will continue to monitor and provide support as needed. ?

## 2021-07-20 NOTE — Group Note (Signed)
Recreation Therapy Group Note ? ? ?Group Topic:Stress Management  ?Group Date: 07/20/2021 ?Start Time: 0930 ?End Time: 1000 ?Facilitators: Victorino Sparrow, LRT,CTRS ?Location: [place] ? ? ?Goal Area(s) Addresses:  ?Patient will actively participate in stress management techniques presented during session.  ?Patient will successfully identify benefit of practicing stress management post d/c.  ? ?Group Description: Guided Imagery. LRT provided education, instruction, and demonstration on practice of visualization via guided imagery. Patient was asked to participate in the technique introduced during session. LRT debriefed including topics of mindfulness, stress management and specific scenarios each patient could use these techniques. Patients were given suggestions of ways to access scripts post d/c and encouraged to explore Youtube and other apps available on smartphones, tablets, and computers. ? ? ?Affect/Mood: N/A ?  ?Participation Level: N/A ?  ? ?Clinical Observations/Individualized Feedback: Group did not occur due to orientation group starting extremely late, running over and using up group time.   ? ? ?Plan: Continue to engage patient in RT group sessions 2-3x/week. ? ? ?Victorino Sparrow, LRT,CTRS ?07/20/2021 2:25 PM ?

## 2021-07-20 NOTE — Group Note (Signed)
Occupational Therapy Group Note ? ?Group Topic:Other  ?Group Date: 07/20/2021 ?Start Time: 1400 ?End Time: 1500 ?Facilitators: Brantley Stage, OT  ? ? ?Routines are sets of habitual activities or practices that we engage in on a regular basis. They can be as simple as a morning cup of coffee or as complex as a daily workout routine. In this essay, we will explore the power of routines in promoting mental health and wellbeing, the reasons why they are important, and how to identify areas of our lives where routines can help improve our overall mental health. Wellbeing The power of routines lies in their ability to provide structure, stability, and predictability in our lives. They help [name] to establish healthy habits and reduce stress and anxiety by minimizing decision fatigue. They also help [name] to manage our time effectively and achieve our goals, which can boost our sense of self-efficacy and confidence. Routines can also foster a sense of community and social connectedness by providing opportunities to engage in shared activities with others. , routines can be a powerful tool in promoting mental health and wellbeing. By providing structure, stability, and predictability in our lives, routines can help [name] to establish healthy habits, reduce stress and anxiety, manage our time effectively, achieve our goals, and foster a sense of community and social connectedness. By identifying areas of our lives where routines can improve our mental health and wellbeing, and by following tips for establishing and maintaining healthy routines, we can harness the power of routines to improve our overall quality of life. ? ? ? ? ?Participation Level: Moderate and Active ?  ?Participation Quality: Independent ?  ?Behavior: Attentive  and Cooperative ?  ?Speech/Thought Process: Directed ?  ?Affect/Mood: Appropriate ?  ?Insight: Fair ?  ?Judgement: Fair ?  ?Individualization: Pt was active in their participation of group  discussion/activity. New routines were identified  ?Modes of Intervention: Discussion and Education  ?Patient Response to Interventions:  Engaged and Interested  ? ?  ?Plan: Continue to engage patient in OT groups 2 - 3x/week. ? ?07/20/2021  ?Brantley Stage, OT ? ?Cornell Barman, OT ? ? ? ? ?

## 2021-07-20 NOTE — Progress Notes (Addendum)
[facility] MD Progress Note ? ?07/20/2021 1:17 PM ?Barry Moore  ?MRN:  [id number] ?Principal Problem: MDD (major depressive disorder), recurrent severe, without psychosis (Barry Moore) ?Diagnosis: Principal Problem: ?  MDD (major depressive disorder), recurrent severe, without psychosis ([name]) ? ?Reason for admission: ?Barry Moore is a 56 y.o. male who presented to [facility] voluntarily from [facility] with suicidal ideation and intoxication. Past Psychiatric History pertinent for MDD. Past Medical History pertinent for Alcoholic hepatitis, alcohol abuse, HTN, pancreatitis, seizures. This is hospitalization day 4.  ? ?Recommendations made by yesterday's psychiatry team: ?-Continue Zoloft 25 mg p.o. daily ?-Continue Librium taper ?-Continue gabapentin 100 mg p.o. 3 times daily ? ?Subjective: ?Patient seen and assessed in day room with Dr. Caswell Corwin. Patient reports feeling better with regards to his alcohol withdrawal. Reports tremors have gone but still has night sweats and tossing and turning when sleeping but he attributes some of this related to the medications he is taking.  ?Patient reports to feeling less depressed today. ?Patient reports appetite and energy are improving. ?Denies SI/AVH today. He discussed some desire to harm sister's husband but reports understanding that he should not and would not as long as he kept his distance from brother-in-law. Reports conflicts between them had predominantly been related to how brother-in-law treats sister and patient's alcohol use. He is able to logically identify benefits/consequences of any aggressive behaviors he would have towards brother-in-law including temporary relief of aggression but ultimately emotionally harming sister, jail time, failure to resolve the conflict in any meaningful way. Became tearful when discussing how he and sister had spoken yesterday and discussed that he would have to keep his distance from sister and brother-in-law in order to avoid any altercations  between himself and brother-in-law.  ? ?Patient reports interest in substance use treatment for his alcohol use and specified one that he and social worker are currently working towards having him discharge to. He plans to stay in [place] area as he wants to stay away from brother-in-law as to avoid any future conflicts.  ? ?Objective: ?Chart Review ?Past 24 hours of patient's chart was reviewed.  ?Patient is compliant with scheduled meds. ?Required Agitation PRNs: none ?Per RN notes, no documented behavioral issues and is  attending group. ?Patient slept, 6.5 hours ? ? ?Total Time spent with patient: 30 minutes ? ?Past Psychiatric History: see H&P ? ?Past Medical History:  ?Past Medical History:  ?Diagnosis Date  ? Alcoholic hepatitis   ? Depression   ? ETOH abuse   ? Gastric bleed 08/2018  ? Hypertension   ? Pancreatitis   ? Seizures ([place])   ? Suicidal behavior   ?  ?Past Surgical History:  ?Procedure Laterality Date  ? BIOPSY  08/05/2018  ? Procedure: BIOPSY;  Surgeon: Irving Copas., MD;  Location: [place];  Service: Gastroenterology;;  ? BIOPSY  08/07/2018  ? Procedure: BIOPSY;  Surgeon: Thornton Park, MD;  Location: [place];  Service: Gastroenterology;;  ? BIOPSY  01/02/2019  ? Procedure: BIOPSY;  Surgeon: Gatha Mayer, MD;  Location: [facility];  Service: Endoscopy;;  ? COLONOSCOPY WITH PROPOFOL N/A 08/07/2018  ? Procedure: COLONOSCOPY WITH PROPOFOL;  Surgeon: Thornton Park, MD;  Location: [place];  Service: Gastroenterology;  Laterality: N/A;  ? ESOPHAGOGASTRODUODENOSCOPY N/A 01/02/2019  ? Procedure: ESOPHAGOGASTRODUODENOSCOPY (EGD);  Surgeon: Gatha Mayer, MD;  Location: [facility];  Service: Endoscopy;  Laterality: N/A;  ? ESOPHAGOGASTRODUODENOSCOPY N/A 07/03/2019  ? Procedure: ESOPHAGOGASTRODUODENOSCOPY (EGD);  Surgeon: Wonda Horner, MD;  Location: [facility];  Service: Endoscopy;  Laterality: N/A;  ? ESOPHAGOGASTRODUODENOSCOPY N/A 06/26/2020  ? Procedure:  ESOPHAGOGASTRODUODENOSCOPY (EGD);  Surgeon: Arta Silence, MD;  Location: [facility];  Service: Endoscopy;  Laterality: N/A;  ? ESOPHAGOGASTRODUODENOSCOPY (EGD) WITH PROPOFOL N/A 11/02/2016  ? Procedure: ESOPHAGOGASTRODUODENOSCOPY (EGD) WITH PROPOFOL;  Surgeon: Carol Ada, MD;  Location: [facility];  Service: Endoscopy;  Laterality: N/A;  ? ESOPHAGOGASTRODUODENOSCOPY (EGD) WITH PROPOFOL N/A 08/05/2018  ? Procedure: ESOPHAGOGASTRODUODENOSCOPY (EGD) WITH PROPOFOL;  Surgeon: Rush Landmark Telford Nab., MD;  Location: [place];  Service: Gastroenterology;  Laterality: N/A;  ? ESOPHAGOGASTRODUODENOSCOPY (EGD) WITH PROPOFOL N/A 07/14/2019  ? Procedure: ESOPHAGOGASTRODUODENOSCOPY (EGD) WITH PROPOFOL;  Surgeon: Otis Brace, MD;  Location: [facility];  Service: Gastroenterology;  Laterality: N/A;  ? HERNIA REPAIR    ? LEG SURGERY    ? POLYPECTOMY  08/07/2018  ? Procedure: POLYPECTOMY;  Surgeon: Thornton Park, MD;  Location: [place];  Service: Gastroenterology;;  ? ?Family History:  ?Family History  ?Problem Relation Age of Onset  ? Diabetes Mother   ? Alcoholism Mother   ? Emphysema Father   ? Lung cancer Father   ? Alcoholism Father   ? ?Family Psychiatric  History: see H&P ?Social History:  ?Social History  ? ?Substance and Sexual Activity  ?Alcohol Use Yes  ? Comment: 5+ BEERS DAILY, Patient does not know how much he drank tonight  ?   ?Social History  ? ?Substance and Sexual Activity  ?Drug Use Not Currently  ? Frequency: 3.0 times per week  ? Types: Cocaine  ?  ?Social History  ? ?Socioeconomic History  ? Marital status: Divorced  ?  Spouse name: Not on file  ? Number of children: Not on file  ? Years of education: Not on file  ? Highest education level: Not on file  ?Occupational History  ? Not on file  ?Tobacco Use  ? Smoking status: Every Day  ?  Packs/day: 1.00  ?  Types: Cigarettes  ? Smokeless tobacco: Never  ?Vaping Use  ? Vaping Use: Never used  ?Substance and Sexual Activity  ? Alcohol  use: Yes  ?  Comment: 5+ BEERS DAILY, Patient does not know how much he drank tonight  ? Drug use: Not Currently  ?  Frequency: 3.0 times per week  ?  Types: Cocaine  ? Sexual activity: Not Currently  ?Other Topics Concern  ? Not on file  ?Social History Narrative  ? Not on file  ? ?Social Determinants of Health  ? ?Financial Resource Strain: Not on file  ?Food Insecurity: Not on file  ?Transportation Needs: Not on file  ?Physical Activity: Not on file  ?Stress: Not on file  ?Social Connections: Not on file  ? ?Additional Social History:  ?  ?  ?  ?  ?  ?  ?  ?  ?  ?  ?  ? ?Sleep: Good ? ?Appetite:  Good ? ?Current Medications: ?Current Facility-Administered Medications  ?Medication Dose Route Frequency Provider Last Rate Last Admin  ? acetaminophen (TYLENOL) tablet 650 mg  650 mg Oral Q6H PRN Briant Cedar, MD   650 mg at 07/17/21 0160  ? alum & mag hydroxide-simeth (MAALOX/MYLANTA) 200-200-20 MG/5ML suspension 30 mL  30 mL Oral Q4H PRN Briant Cedar, MD      ? Derrill Memo ON 07/21/2021] chlordiazePOXIDE (LIBRIUM) capsule 25 mg  25 mg Oral Daily Derrill Center, NP      ? cloNIDine (CATAPRES) tablet 0.1 mg  0.1 mg Oral Q8H PRN Harlow Asa, MD  0.1 mg at 07/17/21 0800  ? gabapentin (NEURONTIN) capsule 100 mg  100 mg Oral TID Laretta Bolster, FNP   100 mg at 07/20/21 1212  ? magnesium hydroxide (MILK OF MAGNESIA) suspension 30 mL  30 mL Oral Daily PRN Briant Cedar, MD      ? multivitamin with minerals tablet 1 tablet  1 tablet Oral Daily Briant Cedar, MD   1 tablet at 07/20/21 9629  ? [START ON 07/21/2021] sertraline (ZOLOFT) tablet 50 mg  50 mg Oral Daily France Ravens, MD      ? thiamine tablet 100 mg  100 mg Oral Daily Briant Cedar, MD   100 mg at 07/20/21 5284  ? traZODone (DESYREL) tablet 100 mg  100 mg Oral QHS PRN France Ravens, MD      ? ? ?Lab Results:  ?Results for orders placed or performed during the hospital encounter of 07/16/21 (from the past 48 hour(s))  ?Hepatitis  panel, acute     Status: None  ? Collection Time: 07/18/21  6:18 PM  ?Result Value Ref Range  ? Hepatitis B Surface Ag NON REACTIVE NON REACTIVE  ? HCV Ab NON REACTIVE NON REACTIVE  ?  Comment: (NOTE) ?Nonreactive HCV

## 2021-07-20 NOTE — BHH Group Notes (Signed)
Spiritual care group on grief and loss facilitated by chaplain Janne Napoleon, [facility]  ? ?Group Goal:  ? ?Support / Education around grief and loss  ? ?Members engage in facilitated group support and psycho-social education.  ? ?Group Description:  ? ?Following introductions and group rules, group members engaged in facilitated group dialog and support around topic of loss, with particular support around experiences of loss in their lives. Group Identified types of loss (relationships / self / things) and identified patterns, circumstances, and changes that precipitate losses. Reflected on thoughts / feelings around loss, normalized grief responses, and recognized variety in grief experience. Group noted Worden's four tasks of grief in discussion.  ? ?Group drew on Adlerian / Rogerian, narrative, MI,  ? ?Patient Progress: Barry Moore attended group and was present for most of group, though was called out to speak with provider.  He did not participate much verbally, but engaged and was supportive of peers. ? ?[employer], Bcc ?Pager, [phone] ? ?

## 2021-07-21 MED ORDER — CHLORDIAZEPOXIDE HCL 25 MG PO CAPS
25.0000 mg | ORAL_CAPSULE | Freq: Once | ORAL | Status: AC
  Administered 2021-07-21: 25 mg via ORAL
  Filled 2021-07-21: qty 1

## 2021-07-21 NOTE — Progress Notes (Signed)
?   07/21/21 0000  ?Psych Admission Type (Psych Patients Only)  ?Admission Status Voluntary  ?Psychosocial Assessment  ?Patient Complaints Anxiety  ?Eye Contact Brief  ?Facial Expression Sad  ?Affect Anxious  ?Speech Logical/coherent  ?Interaction Assertive  ?Motor Activity Slow  ?Appearance/Hygiene Unremarkable  ?Behavior Characteristics Cooperative  ?Mood Pleasant  ?Aggressive Behavior  ?Effect No apparent injury  ?Thought Process  ?Coherency WDL  ?Content WDL  ?Delusions WDL  ?Perception WDL  ?Hallucination None reported or observed  ?Judgment Poor  ?Confusion WDL  ?Danger to Self  ?Current suicidal ideation? Denies  ? ? ?

## 2021-07-21 NOTE — Progress Notes (Signed)
Pt presents with pleasant mood, affect congruent. Barry Moore has been visible on the unit, attending programming and states he '' really know I need to go somewhere long term this time. I can't keep doing this '' patient reported number of place in [place] he is hopeful to go to for 30 day residential treatment. Patient denies any SI or HI at this time. He has been compliant with medications and meals. Pt is safe, will con't to monitor.  ?

## 2021-07-21 NOTE — Group Note (Signed)
Recreation Therapy Group Note ? ? ?Group Topic:Animal Assisted Therapy   ?Group Date: 07/21/2021 ?Start Time: 1430 ?End Time: 2919 ?Facilitators: Victorino Sparrow, LRT,CTRS ?Location: [place] ? ? ?Animal-Assisted Activity (AAA) Program Checklist/Progress Note ?Patient Eligibility Criteria Checklist & Daily Group note for Rec Tx Intervention ? ?AAA/T Program Assumption of Risk Form signed by Patient/ or Parent Legal Guardian YES ? ?Patient is free of allergies or severe asthma  YES ? ?Patient reports no fear of animals YES ? ?Patient reports no history of cruelty to animals YES ? ?Patient understands their participation is voluntary YES ? ?Patient washes hands before animal contact YES ? ?Patient washes hands after animal contact YES ? ? ?Group Description: Patients provided opportunity to interact with trained and credentialed [facility] [employer] Therapy dog and the community volunteer/dog handler. Patients practiced appropriate animal interaction and were educated on dog safety outside of the hospital in common community settings. Patients were allowed to use dog toys and other items to practice commands, engage the dog in play, and/or complete routine aspects of animal care.  ? ?Education: Contractor, Pensions consultant, Communication & Social Skills  ? ? ?Affect/Mood: Appropriate ?  ?Participation Level: Engaged ?  ? ?Clinical Observations/Individualized Feedback:  Pt attended and participated.  Pt shared stories of their pets with therapy dog team as well as asked questions of the therapy dog team. ? ? ?Plan: Continue to engage patient in RT group sessions 2-3x/week. ? ? ?Victorino Sparrow, LRT,CTRS ?07/21/2021 3:58 PM ?

## 2021-07-21 NOTE — BHH Group Notes (Signed)
Adult Psychoeducational Group Note  Date:  07/21/2021 Time:  10:15 AM  Group Topic/Focus:  Orientation:   The focus of this group is to educate the patient on the purpose and policies of crisis stabilization and provide a format to answer questions about their admission.  The group details unit policies and expectations of patients while admitted.  Participation Level:  Active  Participation Quality:  Appropriate  Affect:  Appropriate  Cognitive:  Appropriate  Insight: Appropriate  Engagement in Group:  Engaged  Modes of Intervention:  Education  Additional Comments:  Pt participated in group.   Kern Reap 07/21/2021, 10:15 AM

## 2021-07-21 NOTE — Progress Notes (Signed)
[facility] MD Progress Note ? ?07/21/2021 10:54 AM ?[name]  ?MRN:  [id number] ?Principal Problem: MDD (major depressive disorder), recurrent severe, without psychosis ([place]) ?Diagnosis: Principal Problem: ?  MDD (major depressive disorder), recurrent severe, without psychosis ([place]) ? ?Reason for admission: ?Danthony Kendrix is a 56 y.o. male who presented to [facility] voluntarily from [facility] with suicidal ideation and intoxication. Past Psychiatric History pertinent for MDD. Past Medical History pertinent for Alcoholic hepatitis, alcohol abuse, HTN, pancreatitis, seizures. This is hospitalization day 5.  ? ?Recommendations made by yesterday's psychiatry team: ?-Continue Zoloft 25 mg p.o. daily ?-Continue Librium taper ?-Continue gabapentin 100 mg p.o. 3 times daily ? ?Subjective: ?Patient seen and assessed in the room.  Patient reports feeling even better than yesterday.  Patient still continues to have occasional tremors and nighttime diaphoresis but otherwise reports that symptoms have been improving. ?Patient reports feeling less depressed today. ?Patient reports appetite and energy continue to improve. ?Patient denies suicidal/homicidal ideations.  Patient reports that his thoughts of wanting to harm his brother-in-law are not nearly as present as they were before.  Reports that he would be able to appropriately avoid intentional conflicts. ?Patient reports sleeping very well last night. ? ?Patient reports continued interest in substance use rehab for his alcohol use.  He is open to [facility] if that is still available as a backup option.  He plans to stay in [place] area as he wants to stay away from brother-in-law as to avoid any future conflicts.  ? ?Objective: ?Chart Review ?Past 24 hours of patient's chart was reviewed.  ?Patient is compliant with scheduled meds. ?Required Agitation PRNs: none ?Per RN notes, no documented behavioral issues and is  attending group. ?Patient slept, 7 hours ? ? ?Total Time spent  with patient: 30 minutes ? ?Past Psychiatric History: see H&P ? ?Past Medical History:  ?Past Medical History:  ?Diagnosis Date  ? Alcoholic hepatitis   ? Depression   ? ETOH abuse   ? Gastric bleed 08/2018  ? Hypertension   ? Pancreatitis   ? Seizures ([place])   ? Suicidal behavior   ?  ?Past Surgical History:  ?Procedure Laterality Date  ? BIOPSY  08/05/2018  ? Procedure: BIOPSY;  Surgeon: Irving Copas., MD;  Location: [place];  Service: Gastroenterology;;  ? BIOPSY  08/07/2018  ? Procedure: BIOPSY;  Surgeon: Thornton Park, MD;  Location: [place];  Service: Gastroenterology;;  ? BIOPSY  01/02/2019  ? Procedure: BIOPSY;  Surgeon: Gatha Mayer, MD;  Location: [facility];  Service: Endoscopy;;  ? COLONOSCOPY WITH PROPOFOL N/A 08/07/2018  ? Procedure: COLONOSCOPY WITH PROPOFOL;  Surgeon: Thornton Park, MD;  Location: [place];  Service: Gastroenterology;  Laterality: N/A;  ? ESOPHAGOGASTRODUODENOSCOPY N/A 01/02/2019  ? Procedure: ESOPHAGOGASTRODUODENOSCOPY (EGD);  Surgeon: Gatha Mayer, MD;  Location: [facility];  Service: Endoscopy;  Laterality: N/A;  ? ESOPHAGOGASTRODUODENOSCOPY N/A 07/03/2019  ? Procedure: ESOPHAGOGASTRODUODENOSCOPY (EGD);  Surgeon: Wonda Horner, MD;  Location: [facility];  Service: Endoscopy;  Laterality: N/A;  ? ESOPHAGOGASTRODUODENOSCOPY N/A 06/26/2020  ? Procedure: ESOPHAGOGASTRODUODENOSCOPY (EGD);  Surgeon: Arta Silence, MD;  Location: [facility];  Service: Endoscopy;  Laterality: N/A;  ? ESOPHAGOGASTRODUODENOSCOPY (EGD) WITH PROPOFOL N/A 11/02/2016  ? Procedure: ESOPHAGOGASTRODUODENOSCOPY (EGD) WITH PROPOFOL;  Surgeon: Carol Ada, MD;  Location: [facility];  Service: Endoscopy;  Laterality: N/A;  ? ESOPHAGOGASTRODUODENOSCOPY (EGD) WITH PROPOFOL N/A 08/05/2018  ? Procedure: ESOPHAGOGASTRODUODENOSCOPY (EGD) WITH PROPOFOL;  Surgeon: Rush Landmark Telford Nab., MD;  Location: [place];  Service: Gastroenterology;  Laterality: N/A;  ?  ESOPHAGOGASTRODUODENOSCOPY (EGD) WITH PROPOFOL N/A 07/14/2019  ? Procedure: ESOPHAGOGASTRODUODENOSCOPY (EGD) WITH PROPOFOL;  Surgeon: Otis Brace, MD;  Location: [facility];  Service: Gastroenterology;  Laterality: N/A;  ? HERNIA REPAIR    ? LEG SURGERY    ? POLYPECTOMY  08/07/2018  ? Procedure: POLYPECTOMY;  Surgeon: Thornton Park, MD;  Location: [place];  Service: Gastroenterology;;  ? ?Family History:  ?Family History  ?Problem Relation Age of Onset  ? Diabetes Mother   ? Alcoholism Mother   ? Emphysema Father   ? Lung cancer Father   ? Alcoholism Father   ? ?Family Psychiatric  History: see H&P ?Social History:  ?Social History  ? ?Substance and Sexual Activity  ?Alcohol Use Yes  ? Comment: 5+ BEERS DAILY, Patient does not know how much he drank tonight  ?   ?Social History  ? ?Substance and Sexual Activity  ?Drug Use Not Currently  ? Frequency: 3.0 times per week  ? Types: Cocaine  ?  ?Social History  ? ?Socioeconomic History  ? Marital status: Divorced  ?  Spouse name: Not on file  ? Number of children: Not on file  ? Years of education: Not on file  ? Highest education level: Not on file  ?Occupational History  ? Not on file  ?Tobacco Use  ? Smoking status: Every Day  ?  Packs/day: 1.00  ?  Types: Cigarettes  ? Smokeless tobacco: Never  ?Vaping Use  ? Vaping Use: Never used  ?Substance and Sexual Activity  ? Alcohol use: Yes  ?  Comment: 5+ BEERS DAILY, Patient does not know how much he drank tonight  ? Drug use: Not Currently  ?  Frequency: 3.0 times per week  ?  Types: Cocaine  ? Sexual activity: Not Currently  ?Other Topics Concern  ? Not on file  ?Social History Narrative  ? Not on file  ? ?Social Determinants of Health  ? ?Financial Resource Strain: Not on file  ?Food Insecurity: Not on file  ?Transportation Needs: Not on file  ?Physical Activity: Not on file  ?Stress: Not on file  ?Social Connections: Not on file  ? ?Additional Social History:  ?  ?  ?  ?  ?  ?  ?  ?  ?  ?  ?  ? ?Sleep:  Good ? ?Appetite:  Good ? ?Current Medications: ?Current Facility-Administered Medications  ?Medication Dose Route Frequency Provider Last Rate Last Admin  ? acetaminophen (TYLENOL) tablet 650 mg  650 mg Oral Q6H PRN Briant Cedar, MD   650 mg at 07/17/21 0300  ? alum & mag hydroxide-simeth (MAALOX/MYLANTA) 200-200-20 MG/5ML suspension 30 mL  30 mL Oral Q4H PRN Briant Cedar, MD      ? cloNIDine (CATAPRES) tablet 0.1 mg  0.1 mg Oral Q8H PRN Nelda Marseille, Amy E, MD   0.1 mg at 07/17/21 0800  ? gabapentin (NEURONTIN) capsule 100 mg  100 mg Oral TID Laretta Bolster, FNP   100 mg at 07/21/21 0801  ? magnesium hydroxide (MILK OF MAGNESIA) suspension 30 mL  30 mL Oral Daily PRN Briant Cedar, MD      ? multivitamin with minerals tablet 1 tablet  1 tablet Oral Daily Briant Cedar, MD   1 tablet at 07/21/21 0801  ? sertraline (ZOLOFT) tablet 50 mg  50 mg Oral Daily France Ravens, MD   50 mg at 07/21/21 0801  ? thiamine tablet 100 mg  100 mg Oral Daily Pashayan, Redgie Grayer, MD  100 mg at 07/21/21 0801  ? traZODone (DESYREL) tablet 100 mg  100 mg Oral QHS France Ravens, MD      ? ? ?Lab Results:  ?Results for orders placed or performed during the hospital encounter of 07/16/21 (from the past 48 hour(s))  ?Comprehensive metabolic panel     Status: Abnormal  ? Collection Time: 07/20/21  6:39 PM  ?Result Value Ref Range  ? Sodium 138 135 - 145 mmol/L  ? Potassium 4.1 3.5 - 5.1 mmol/L  ? Chloride 104 98 - 111 mmol/L  ? CO2 28 22 - 32 mmol/L  ? Glucose, Bld 84 70 - 99 mg/dL  ?  Comment: Glucose reference range applies only to samples taken after fasting for at least 8 hours.  ? BUN 17 6 - 20 mg/dL  ? Creatinine, Ser 0.79 0.61 - 1.24 mg/dL  ? Calcium 9.5 8.9 - 10.3 mg/dL  ? Total Protein 7.9 6.5 - 8.1 g/dL  ? Albumin 4.1 3.5 - 5.0 g/dL  ? AST 57 (H) 15 - 41 U/L  ? ALT 41 0 - 44 U/L  ? Alkaline Phosphatase 157 (H) 38 - 126 U/L  ? Total Bilirubin 0.5 0.3 - 1.2 mg/dL  ? GFR, Estimated >60 >60 mL/min  ?  Comment:  (NOTE) ?Calculated using the CKD-EPI Creatinine Equation (2021) ?  ? Anion gap 6 5 - 15  ?  Comment: Performed at [facility], [place] [address] [place], [place] [address]  ? ? ?Blood Alcohol level:  ?

## 2021-07-21 NOTE — Progress Notes (Signed)
?   07/21/21 2200  ?Psych Admission Type (Psych Patients Only)  ?Admission Status Voluntary  ?Psychosocial Assessment  ?Patient Complaints Anxiety  ?Eye Contact Brief  ?Facial Expression Sad  ?Affect Anxious  ?Speech Logical/coherent  ?Interaction Assertive  ?Motor Activity Slow  ?Appearance/Hygiene Unremarkable  ?Behavior Characteristics Cooperative  ?Mood Pleasant  ?Aggressive Behavior  ?Effect No apparent injury  ?Thought Process  ?Coherency WDL  ?Content WDL  ?Delusions WDL  ?Perception WDL  ?Hallucination None reported or observed  ?Judgment Poor  ?Confusion WDL  ?Danger to Self  ?Current suicidal ideation? Denies  ? ? ?

## 2021-07-22 ENCOUNTER — Encounter (HOSPITAL_COMMUNITY): Payer: Self-pay

## 2021-07-22 MED ORDER — TRAZODONE HCL 100 MG PO TABS
100.0000 mg | ORAL_TABLET | Freq: Every day | ORAL | 0 refills | Status: DC
Start: 2021-07-22 — End: 2021-08-22

## 2021-07-22 MED ORDER — SERTRALINE HCL 50 MG PO TABS
50.0000 mg | ORAL_TABLET | Freq: Every day | ORAL | 0 refills | Status: DC
Start: 2021-07-23 — End: 2021-08-22

## 2021-07-22 MED ORDER — GABAPENTIN 100 MG PO CAPS
100.0000 mg | ORAL_CAPSULE | Freq: Three times a day (TID) | ORAL | 0 refills | Status: DC
Start: 2021-07-22 — End: 2021-08-22

## 2021-07-22 NOTE — Progress Notes (Signed)
D. Pt has been appropriate on the unit, observed attending groups. Pt rated his depression,hopelessness and anxiety a 5/2/2, respectively. Pt reported that his goal is "to get out".  Pt currently denies SI/HI and AVH  ?A. Labs and vitals monitored. Pt given and educated on medications. Pt supported emotionally and encouraged to express concerns and ask questions.   ?R. Pt remains safe with 15 minute checks. Will continue POC. ? ?  ?

## 2021-07-22 NOTE — Progress Notes (Signed)
?   07/22/21 0500  ?Sleep  ?Number of Hours 6.25  ? ? ?

## 2021-07-22 NOTE — Group Note (Signed)
Recreation Therapy Group Note ? ? ?Group Topic:Team Building  ?Group Date: 07/22/2021 ?Start Time: 0930 ?End Time: 1000 ?Facilitators: Victorino Sparrow, LRT,CTRS ?Location: [place] ? ? ?Goal Area(s) Addresses:  ?Patient will effectively work with peer towards shared goal.  ?Patient will identify skills used to make activity successful.  ?Patient will identify how skills used during activity can be applied to reach post d/c goals.  ? ?Group Description: Tallest [employer]. In teams of 3-4, patients were given 25 small craft pipe cleaners. Using the materials provided, patients were instructed to compete again the opposing team(s) to build the tallest free-standing structure from floor level. The activity was timed; difficulty increased by Probation officer as Pharmacist, hospital continued.  Systematically resources were removed with additional directions for example, placing one arm behind their back, working in silence, and shape stipulations. LRT facilitated post-activity discussion reviewing team processes and necessary communication skills involved in completion. Patients were encouraged to reflect how the skills utilized, or not utilized, in this activity can be incorporated to positively impact support systems post discharge. ? ? ?Affect/Mood: Appropriate ?  ?Participation Level: Engaged ?  ?Participation Quality: Independent ?  ?Behavior: Appropriate ?  ?Speech/Thought Process: Focused ?  ?Insight: Good ?  ?Judgement: Good ?  ?Modes of Intervention: Team-building ?  ?Patient Response to Interventions:  Engaged ?  ?Education Outcome: ? Acknowledges education and In group clarification offered   ? ?Clinical Observations/Individualized Feedback: Pt was bright and attentive during group.  Pt worked well with peers in coming up with ideas.  Pt seemed to be enjoying the activity and was appropriate throughout.  ? ? ?Plan: Continue to engage patient in RT group sessions 2-3x/week. ? ? ?Victorino Sparrow,  LRT,CTRS ?07/22/2021 12:50 PM ?

## 2021-07-22 NOTE — BH IP Treatment Plan (Signed)
Interdisciplinary Treatment and Diagnostic Plan Update ? ?07/22/2021 ?Time of Session: 10:10am  ?Barry Moore ?MRN: [id number] ? ?Principal Diagnosis: MDD (major depressive disorder), recurrent severe, without psychosis ([place]) ? ?Secondary Diagnoses: Principal Problem: ?  MDD (major depressive disorder), recurrent severe, without psychosis ([place]) ? ? ?Current Medications:  ?Current Facility-Administered Medications  ?Medication Dose Route Frequency Provider Last Rate Last Admin  ? acetaminophen (TYLENOL) tablet 650 mg  650 mg Oral Q6H PRN Briant Cedar, MD   650 mg at 07/17/21 2585  ? alum & mag hydroxide-simeth (MAALOX/MYLANTA) 200-200-20 MG/5ML suspension 30 mL  30 mL Oral Q4H PRN Briant Cedar, MD      ? cloNIDine (CATAPRES) tablet 0.1 mg  0.1 mg Oral Q8H PRN Nelda Marseille, Amy E, MD   0.1 mg at 07/17/21 0800  ? gabapentin (NEURONTIN) capsule 100 mg  100 mg Oral TID Laretta Bolster, FNP   100 mg at 07/22/21 2778  ? magnesium hydroxide (MILK OF MAGNESIA) suspension 30 mL  30 mL Oral Daily PRN Briant Cedar, MD      ? multivitamin with minerals tablet 1 tablet  1 tablet Oral Daily Briant Cedar, MD   1 tablet at 07/22/21 2423  ? sertraline (ZOLOFT) tablet 50 mg  50 mg Oral Daily France Ravens, MD   50 mg at 07/22/21 0755  ? thiamine tablet 100 mg  100 mg Oral Daily Briant Cedar, MD   100 mg at 07/22/21 0755  ? traZODone (DESYREL) tablet 100 mg  100 mg Oral QHS France Ravens, MD   100 mg at 07/21/21 2122  ? ?PTA Medications: ?Medications Prior to Admission  ?Medication Sig Dispense Refill Last Dose  ? propranolol (INDERAL) 10 MG tablet Take 10 mg by mouth 2 (two) times daily.     ? topiramate (TOPAMAX) 25 MG tablet Take 25 mg by mouth 2 (two) times daily.     ? venlafaxine XR (EFFEXOR-XR) 75 MG 24 hr capsule Take 75 mg by mouth 2 (two) times daily.     ? ? ?Patient Stressors: Financial difficulties   ?Marital or family conflict   ?Medication change or noncompliance   ?Substance abuse    ? ?Patient Strengths: Ability for insight  ?Communication skills  ?Supportive family/friends  ? ?Treatment Modalities: Medication Management, Group therapy, Case management,  ?1 to 1 session with clinician, Psychoeducation, Recreational therapy. ? ? ?Physician Treatment Plan for Primary Diagnosis: MDD (major depressive disorder), recurrent severe, without psychosis ([place]) ?Long Term Goal(s): Improvement in symptoms so as ready for discharge  ? ?Short Term Goals: Ability to verbalize feelings will improve ?Ability to disclose and discuss suicidal ideas ?Ability to identify and develop effective coping behaviors will improve ?Compliance with prescribed medications will improve ?Ability to identify changes in lifestyle to reduce recurrence of condition will improve ?Ability to demonstrate self-control will improve ? ?Medication Management: Evaluate patient's response, side effects, and tolerance of medication regimen. ? ?Therapeutic Interventions: 1 to 1 sessions, Unit Group sessions and Medication administration. ? ?Evaluation of Outcomes: Progressing ? ?Physician Treatment Plan for Secondary Diagnosis: Principal Problem: ?  MDD (major depressive disorder), recurrent severe, without psychosis ([place]) ? ?Long Term Goal(s): Improvement in symptoms so as ready for discharge  ? ?Short Term Goals: Ability to verbalize feelings will improve ?Ability to disclose and discuss suicidal ideas ?Ability to identify and develop effective coping behaviors will improve ?Compliance with prescribed medications will improve ?Ability to identify changes in lifestyle to reduce recurrence of condition will improve ?Ability to  demonstrate self-control will improve    ? ?Medication Management: Evaluate patient's response, side effects, and tolerance of medication regimen. ? ?Therapeutic Interventions: 1 to 1 sessions, Unit Group sessions and Medication administration. ? ?Evaluation of Outcomes: Progressing ? ? ?RN Treatment Plan for Primary  Diagnosis: MDD (major depressive disorder), recurrent severe, without psychosis ([place]) ?Long Term Goal(s): Knowledge of disease and therapeutic regimen to maintain health will improve ? ?Short Term Goals: Ability to remain free from injury will improve, Ability to participate in decision making will improve, Ability to verbalize feelings will improve, Ability to disclose and discuss suicidal ideas, and Ability to identify and develop effective coping behaviors will improve ? ?Medication Management: RN will administer medications as ordered by provider, will assess and evaluate patient's response and provide education to patient for prescribed medication. RN will report any adverse and/or side effects to prescribing provider. ? ?Therapeutic Interventions: 1 on 1 counseling sessions, Psychoeducation, Medication administration, Evaluate responses to treatment, Monitor vital signs and CBGs as ordered, Perform/monitor CIWA, COWS, AIMS and Fall Risk screenings as ordered, Perform wound care treatments as ordered. ? ?Evaluation of Outcomes: Progressing ? ? ?LCSW Treatment Plan for Primary Diagnosis: MDD (major depressive disorder), recurrent severe, without psychosis ([place]) ?Long Term Goal(s): Safe transition to appropriate next level of care at discharge, Engage patient in therapeutic group addressing interpersonal concerns. ? ?Short Term Goals: Engage patient in aftercare planning with referrals and resources, Increase social support, Increase emotional regulation, Facilitate acceptance of mental health diagnosis and concerns, Identify triggers associated with mental health/substance abuse issues, and Increase skills for wellness and recovery ? ?Therapeutic Interventions: Assess for all discharge needs, 1 to 1 time with Education officer, museum, Explore available resources and support systems, Assess for adequacy in community support network, Educate family and significant other(s) on suicide prevention, Complete Psychosocial  Assessment, Interpersonal group therapy. ? ?Evaluation of Outcomes: Progressing ? ? ?Progress in Treatment: ?Attending groups: No. ?Participating in groups: No. ?Taking medication as prescribed: Yes. ?Toleration medication: Yes. ?Family/Significant other contact made: No, will contact:  if provides consent  ?Patient understands diagnosis: Yes. ?Discussing patient identified problems/goals with staff: Yes. ?Medical problems stabilized or resolved: Yes. ?Denies suicidal/homicidal ideation: Yes. ?Issues/concerns per patient self-inventory: No. ?  ?  ?New problem(s) identified: No, Describe:  none ?  ?New Short Term/Long Term Goal(s): detox, medication management for mood stabilization; elimination of SI thoughts; development of comprehensive mental wellness/sobriety plan ?  ?Patient Goals:  "Control over substance use" ?  ?  ?Discharge Plan or Barriers: Patient recently admitted. CSW will continue to follow and assess for appropriate referrals and possible discharge planning.  ?  ?  ?Reason for Continuation of Hospitalization: Anxiety ?Depression ?Medication stabilization ?Suicidal ideation ?Withdrawal symptoms ?  ?Estimated Length of Stay: 3-5 days ? ?Last 3 [place] Suicide Severity Risk Score: ?[place] Admission (Current) from 07/16/2021 in [place] [facility] ED from 07/15/2021 in [place] DEPT ED from 10/13/2020 in [place] DEPT  ?C-SSRS RISK CATEGORY High Risk High Risk High Risk  ? ?  ? ? ?Last PHQ 2/9 Scores: ? ?  09/05/2020  ?  2:56 AM 06/18/2020  ?  4:19 AM 02/01/2019  ?  2:50 PM  ?Depression screen PHQ 2/9  ?Decreased Interest '2 2 1  '$ ?Down, Depressed, Hopeless '2 2 1  '$ ?PHQ - 2 Score '4 4 2  '$ ?Altered sleeping '2 3 1  '$ ?Tired, decreased energy '2 2 3  '$ ?Change in appetite '2 3 1  '$ ?  Feeling bad or failure about yourself  '2 2 1  '$ ?Trouble concentrating '2 3 1  '$ ?Moving slowly or fidgety/restless '2 2 1  '$ ?Suicidal thoughts '2 2 1   '$ ?PHQ-9 Score '18 21 11  '$ ?Difficult doing work/chores Very difficult Very difficult   ? ? ?Scribe for Treatment Team: ?Darleen Crocker, [place] ?07/22/2021 ?10:29 AM ? ? ?

## 2021-07-22 NOTE — Group Note (Signed)
LCSW Group Therapy Note ? ? ?Group Date: 07/22/2021 ?Start Time: 1300 ?End Time: 1400 ? ?Type of Therapy and Topic:  Group Therapy:  Self-Care after Hospitalization ? ?Participation Level:  Active  ? ?Description of Group ?This process group involved patients discussing how they plan to take care of themselves in a better manner when they get home from the hospital.  The group started with patients listing one healthy and one unhealthy way they took care of themselves prior to hospitalization.  A discussion ensued about the differences in healthy and unhealthy coping skills.  Group members shared ideas about making changes when they return home so that they can stay well and in recovery.  The white board was used to list ideas so that patients can continue to see these ideas throughout the day. ? ?Therapeutic Goals ?Patient will identify and describe one healthy and one unhealthy coping technique used prior to hospitalization ?Patient will participate in generating ideas about healthy self-care options when they return to the community ?Patients will be supportive of one another and receive said support from others ?Patient will identify one healthy self-care activity to add to his/her post-hospitalization life that can help in recovery ? ?Summary of Patient Progress:  The patient expressed that prior to hospitalization some healthy self-care activity they engaged in was mountain biking.  Patient's participated in group and was appropriate with their peers.   Patient accepted all worksheets and followed along with all group discussions.  ? ? ?Therapeutic Modalities ?Brief Solution-Focused Therapy ?Motivational Interviewing ?Psychoeducation ? ?Darleen Crocker, LCSWA ?07/22/2021  2:02 PM   ? ?

## 2021-07-22 NOTE — [facility] Suicide Risk Assessment (Addendum)
Suicide Risk Assessment ? ?Discharge Assessment    ?[facility] Discharge Suicide Risk Assessment ? ? ?Principal Problem: MDD (major depressive disorder), recurrent severe, without psychosis ([place]) ?Discharge Diagnoses: Principal Problem: ?  MDD (major depressive disorder), recurrent severe, without psychosis ([place]) ? ? ?Total Time spent with patient: 30 minutes ? ?During the patient's hospitalization, patient had extensive initial psychiatric evaluation, and follow-up psychiatric evaluations every day. ? ?Psychiatric diagnoses provided upon initial assessment: ?MDD ?Alcohol Use Disorder ?  ?Patient's psychiatric medications were adjusted on admission:  ?Zoloft 25 mg PO qd for depressive symptoms ?Librium taper for Alcohol withdrawal ? ?During the hospitalization, other adjustments were made to the patient's psychiatric medication regimen:  ?Increase Zoloft to 50 mg qd for depressive symptoms ?Increase Trazodone to 100 mg qhs for insomnia ?Librium taper completed  ? ?Gradually, patient started adjusting to milieu.   ?Patient's care was discussed during the interdisciplinary team meeting every day during the hospitalization. ? ?The patient denied having side effects to prescribed psychiatric medication. ? ?The patient reports their target psychiatric symptoms of depression responded well to the psychiatric medications, and the patient reports overall benefit other psychiatric hospitalization. Supportive psychotherapy was provided to the patient. The patient also participated in regular group therapy while admitted.  ? ?Labs were reviewed with the patient, and abnormal results were discussed with the patient. ? ?The patient denied having suicidal thoughts more than 48 hours prior to discharge.  Patient denies having homicidal thoughts.  Patient denies having auditory hallucinations.  Patient denies any visual hallucinations.  Patient denies having paranoid thoughts. ? ?The patient is able to verbalize their individual safety  plan to this provider. ? ?It is recommended to the patient to continue psychiatric medications as prescribed, after discharge from the hospital.   ? ?It is recommended to the patient to follow up with your outpatient psychiatric provider and PCP. ? ?Discussed with the patient, the impact of alcohol, drugs, tobacco have been there overall psychiatric and medical wellbeing, and total abstinence from substance use was recommended the patient. ? ?Musculoskeletal: ?Strength & Muscle Tone: within normal limits ?Gait & Station: normal ?Patient leans: N/A ? ?Psychiatric Specialty Exam ? ?Presentation  ?General Appearance: Appropriate for Environment; Casual; Fairly Groomed ? ?Eye Contact:Good ? ?Speech:Normal Rate ? ?Speech Volume:Normal ? ?Handedness:Right ? ? ?Mood and Affect  ?Mood:Euthymic ? ?Duration of Depression Symptoms: Greater than two weeks ? ?Affect:Appropriate; Congruent; Full Range ? ? ?Thought Process  ?Thought Processes:Linear ? ?Descriptions of Associations:Intact ? ?Orientation:Full (Time, Place and Person) ? ?Thought Content:Logical ? ?History of Schizophrenia/Schizoaffective disorder:No ? ?Duration of Psychotic Symptoms:No data recorded ?Hallucinations:Hallucinations: None ? ?Ideas of Reference:None ? ?Suicidal Thoughts:Suicidal Thoughts: No ? ?Homicidal Thoughts:Homicidal Thoughts: No ? ? ?Sensorium  ?Memory:Immediate Good; Recent Good; Remote Good ? ?Judgment:Fair ? ?Insight:Fair ? ? ?Executive Functions  ?Concentration:Fair ? ?Attention Span:Fair ? ?Recall:Fair ? ?[place] ? ?Language:Fair ? ? ?Psychomotor Activity  ?Psychomotor Activity:Psychomotor Activity: Normal ? ? ?Assets  ?Assets:Communication Skills; Desire for Improvement; Physical Health; Resilience ? ? ?Sleep  ?Sleep:Sleep: Good ?Number of Hours of Sleep: 6.25 ? ? ?Physical Exam: ?Physical Exam ?Vitals and nursing note reviewed.  ?Constitutional:   ?   Appearance: Normal appearance. He is normal weight.  ?HENT:  ?   Head:  Normocephalic and atraumatic.  ?Pulmonary:  ?   Effort: Pulmonary effort is normal.  ?Neurological:  ?   General: No focal deficit present.  ?   Mental Status: He is oriented to person, place, and time.  ? ?Review of  Systems  ?Respiratory:  Negative for shortness of breath.   ?Cardiovascular:  Negative for chest pain.  ?Gastrointestinal:  Negative for abdominal pain, constipation, diarrhea, heartburn, nausea and vomiting.  ?Neurological:  Negative for headaches.  ?Psychiatric/Behavioral:  Positive for substance abuse. Negative for depression, hallucinations, memory loss and suicidal ideas. The patient is not nervous/anxious and does not have insomnia.   ?Blood pressure 117/72, pulse 91, temperature (!) 97.4 ?F (36.3 ?C), temperature source Oral, resp. rate 18, SpO2 100 %. There is no height or weight on file to calculate BMI. ? ?Mental Status Per Nursing Assessment::   ?On Admission:  Suicidal ideation indicated by patient ? ?Demographic Factors:  ?Male, Caucasian, and Low socioeconomic status ? ?Loss Factors: ?Loss of significant relationship ? ?Historical Factors: ?Impulsivity ? ?Risk Reduction Factors:   ?Sense of responsibility to family, Religious beliefs about death, Positive therapeutic relationship, and Positive coping skills or problem solving skills ? ?Continued Clinical Symptoms:  ?Alcohol/Substance Abuse/Dependencies ?More than one psychiatric diagnosis ?Previous Psychiatric Diagnoses and Treatments ? ?Cognitive Features That Contribute To Risk:  ?None   ? ?Suicide Risk:  ?Mild:  There are no identifiable suicide plans, no associated intent, mild dysphoria and related symptoms, good self-control (both objective and subjective assessment), few other risk factors, and identifiable protective factors, including available and accessible social support. ? ? Follow-up Information   ? ? [place]. Go to.   ?Specialty: Behavioral Health ?Why: You may go to this provider for therapy  and medication management services on walk in days:  Mondays and Wednesdays, arrive at 7:30 am.  Services are provided on a first come, first served basis for [facility] residents. ?Contact information: ?[address] ?[place] [address] ?[phone] ? ?  ?  ? ?  ?  ? ?  ? ? ?Plan Of Care/Follow-up recommendations:  ?Activity: as tolerated ? ?Diet: heart healthy ? ?Other: ?-Follow-up with your outpatient psychiatric provider -instructions on appointment date, time, and address (location) are provided to you in discharge paperwork. ? ?-Take your psychiatric medications as prescribed at discharge - instructions are provided to you in the discharge paperwork ? ?-Follow-up with outpatient primary care doctor and other specialists -for management of chronic medical disease, including: prediabetes ? ?-Testing: Follow-up with outpatient provider for abnormal lab results: AST 57, Alk Phos 157 ? ?-Recommend abstinence from alcohol, tobacco, and other illicit drug use at discharge.  ? ?-If your psychiatric symptoms recur, worsen, or if you have side effects to your psychiatric medications, call your outpatient psychiatric provider, 911, [phone] or go to the nearest emergency department. ? ?-If suicidal thoughts recur, call your outpatient psychiatric provider, 911, [phone] or go to the nearest emergency department. ? ? ?France Ravens, MD ?07/22/2021, 3:59 PM ? ?Total Time Spent in Direct Patient Care:  ?I personally spent 45 minutes on the unit in direct patient care. The direct patient care time included face-to-face time with the patient, reviewing the patient's chart, communicating with other professionals, and coordinating care. Greater than 50% of this time was spent in counseling or coordinating care with the patient regarding goals of hospitalization, psycho-education, and discharge planning needs. ? ?I have independently evaluated the patient during a face-to-face assessment on 07/22/21. I reviewed the patient's  chart, and I participated in key portions of the service. I discussed the case with the [employer], and I agree with the assessment and plan of care as documented in the [employer] note, as addended b

## 2021-07-22 NOTE — Group Note (Signed)
Date:  07/22/2021 ?Time:  1:37 PM ? ?Group Topic/Focus:  ?Self Care:   The focus of this group is to help patients understand the importance of self-care in order to improve or restore emotional, physical, spiritual, interpersonal, and financial health. ? ? ? ?Participation Level:  Active ? ?Participation Quality:  Appropriate ? ?Affect:  Appropriate ? ?Cognitive:  Appropriate ? ?Insight: Appropriate ? ?Engagement in Group:  Engaged ? ?Modes of Intervention:  Education ? ?Additional Comments:  Educated patient on how choosing pros and cons of each decision that he makes in life will allow him to  make good choices based on those pros and cons. Explained that the harder decisions will required this in order to make good choices and decisions about his life. ? ?Barry Moore ?07/22/2021, 1:37 PM ? ?

## 2021-07-22 NOTE — Progress Notes (Signed)
Adult Psychoeducational Group Note ? ?Date:  07/22/2021 ?Time:  5:20 PM ? ?Group Topic/Focus:  ?Goals Group:   The focus of this group is to help patients establish daily goals to achieve during treatment and discuss how the patient can incorporate goal setting into their daily lives to aide in recovery. ? ?Participation Level:  Active ? ?Participation Quality:  Appropriate ? ?Affect:  Appropriate ? ?Cognitive:  Appropriate ? ?Insight: Appropriate ? ?Engagement in Group:  Engaged ? ?Modes of Intervention:  Discussion ? ?Additional Comments:   ? ?Barry Moore attended morning orientation/goal setting group and participated.  ? ?07/22/2021, 5:20 PM ?

## 2021-07-22 NOTE — Discharge Summary (Addendum)
Physician Discharge Summary Note ? ?Patient:  Barry Moore is an 56 y.o., male ?MRN:  [id number] ?DOB:  [birth year] ?Patient phone:  [phone] (home)  ?Patient address:   ?Homeless ?[place] [address],  ?Total Time spent with patient: 30 minutes ? ?Date of Admission:  07/16/2021 ?Date of Discharge: 07/22/2021 ? ?Reason for Admission:   ?Barry Moore is a 56 y.o. male who presented to [facility] voluntarily from [facility] with suicidal ideation and intoxication. Past Psychiatric History pertinent for MDD. Past Medical History pertinent for Alcoholic hepatitis, alcohol abuse, HTN, pancreatitis, seizures.  ?  ? ?Principal Problem: MDD (major depressive disorder), recurrent severe, without psychosis ([place]) ?Discharge Diagnoses: Principal Problem: ?  MDD (major depressive disorder), recurrent severe, without psychosis ([place]) ? ? ?Past Psychiatric History: Depression and "mood disorder". Suicide attempt 20 years ago. ? ?Past Medical History:  ?Past Medical History:  ?Diagnosis Date  ? Alcoholic hepatitis   ? Depression   ? ETOH abuse   ? Gastric bleed 08/2018  ? Hypertension   ? Pancreatitis   ? Seizures ([place])   ? Suicidal behavior   ?  ?Past Surgical History:  ?Procedure Laterality Date  ? BIOPSY  08/05/2018  ? Procedure: BIOPSY;  Surgeon: Irving Copas., MD;  Location: [place];  Service: Gastroenterology;;  ? BIOPSY  08/07/2018  ? Procedure: BIOPSY;  Surgeon: Thornton Park, MD;  Location: [place];  Service: Gastroenterology;;  ? BIOPSY  01/02/2019  ? Procedure: BIOPSY;  Surgeon: Gatha Mayer, MD;  Location: [facility];  Service: Endoscopy;;  ? COLONOSCOPY WITH PROPOFOL N/A 08/07/2018  ? Procedure: COLONOSCOPY WITH PROPOFOL;  Surgeon: Thornton Park, MD;  Location: [place];  Service: Gastroenterology;  Laterality: N/A;  ? ESOPHAGOGASTRODUODENOSCOPY N/A 01/02/2019  ? Procedure: ESOPHAGOGASTRODUODENOSCOPY (EGD);  Surgeon: Gatha Mayer, MD;  Location: [facility];  Service: Endoscopy;   Laterality: N/A;  ? ESOPHAGOGASTRODUODENOSCOPY N/A 07/03/2019  ? Procedure: ESOPHAGOGASTRODUODENOSCOPY (EGD);  Surgeon: Wonda Horner, MD;  Location: [facility];  Service: Endoscopy;  Laterality: N/A;  ? ESOPHAGOGASTRODUODENOSCOPY N/A 06/26/2020  ? Procedure: ESOPHAGOGASTRODUODENOSCOPY (EGD);  Surgeon: Arta Silence, MD;  Location: [facility];  Service: Endoscopy;  Laterality: N/A;  ? ESOPHAGOGASTRODUODENOSCOPY (EGD) WITH PROPOFOL N/A 11/02/2016  ? Procedure: ESOPHAGOGASTRODUODENOSCOPY (EGD) WITH PROPOFOL;  Surgeon: Carol Ada, MD;  Location: [facility];  Service: Endoscopy;  Laterality: N/A;  ? ESOPHAGOGASTRODUODENOSCOPY (EGD) WITH PROPOFOL N/A 08/05/2018  ? Procedure: ESOPHAGOGASTRODUODENOSCOPY (EGD) WITH PROPOFOL;  Surgeon: Rush Landmark Telford Nab., MD;  Location: [place];  Service: Gastroenterology;  Laterality: N/A;  ? ESOPHAGOGASTRODUODENOSCOPY (EGD) WITH PROPOFOL N/A 07/14/2019  ? Procedure: ESOPHAGOGASTRODUODENOSCOPY (EGD) WITH PROPOFOL;  Surgeon: Otis Brace, MD;  Location: [facility];  Service: Gastroenterology;  Laterality: N/A;  ? HERNIA REPAIR    ? LEG SURGERY    ? POLYPECTOMY  08/07/2018  ? Procedure: POLYPECTOMY;  Surgeon: Thornton Park, MD;  Location: [place];  Service: Gastroenterology;;  ? ?Family History:  ?Family History  ?Problem Relation Age of Onset  ? Diabetes Mother   ? Alcoholism Mother   ? Emphysema Father   ? Lung cancer Father   ? Alcoholism Father   ? ?Family Psychiatric  History: Alcohol abuse by both father and mother ?Social History:  ?Social History  ? ?Substance and Sexual Activity  ?Alcohol Use Yes  ? Comment: 5+ BEERS DAILY, Patient does not know how much he drank tonight  ?   ?Social History  ? ?Substance and Sexual Activity  ?Drug Use Not Currently  ? Frequency: 3.0 times per week  ?  Types: Cocaine  ?  ?Social History  ? ?Socioeconomic History  ? Marital status: Divorced  ?  Spouse name: Not on file  ? Number of children: Not on file  ? Years of  education: Not on file  ? Highest education level: Not on file  ?Occupational History  ? Not on file  ?Tobacco Use  ? Smoking status: Every Day  ?  Packs/day: 1.00  ?  Types: Cigarettes  ? Smokeless tobacco: Never  ?Vaping Use  ? Vaping Use: Never used  ?Substance and Sexual Activity  ? Alcohol use: Yes  ?  Comment: 5+ BEERS DAILY, Patient does not know how much he drank tonight  ? Drug use: Not Currently  ?  Frequency: 3.0 times per week  ?  Types: Cocaine  ? Sexual activity: Not Currently  ?Other Topics Concern  ? Not on file  ?Social History Narrative  ? Not on file  ? ?Social Determinants of Health  ? ?Financial Resource Strain: Not on file  ?Food Insecurity: Not on file  ?Transportation Needs: Not on file  ?Physical Activity: Not on file  ?Stress: Not on file  ?Social Connections: Not on file  ? ? ?Hospital Course:   ?During the patient's hospitalization, patient had extensive initial psychiatric evaluation, and follow-up psychiatric evaluations every day. ?  ?Psychiatric diagnoses provided upon initial assessment: ?MDD ?Alcohol Use Disorder ?  ?Patient's psychiatric medications were adjusted on admission:  ?Zoloft 25 mg PO qd for depressive symptoms ?Librium taper for Alcohol withdrawal ?  ?During the hospitalization, other adjustments were made to the patient's psychiatric medication regimen:  ?Increase Zoloft to 50 mg qd for depressive symptoms ?Increase Trazodone to 100 mg qhs for insomnia ?Librium taper completed  ?  ?Gradually, patient started adjusting to milieu.   ?Patient's care was discussed during the interdisciplinary team meeting every day during the hospitalization. ?  ?The patient denied having side effects to prescribed psychiatric medication. ?  ?The patient reports their target psychiatric symptoms of depression responded well to the psychiatric medications, and the patient reports overall benefit other psychiatric hospitalization. Supportive psychotherapy was provided to the patient. The  patient also participated in regular group therapy while admitted.  ?  ?Labs were reviewed with the patient, and abnormal results were discussed with the patient. ?  ?The patient denied having suicidal thoughts more than 48 hours prior to discharge.  Patient denies having homicidal thoughts.  Patient denies having auditory hallucinations.  Patient denies any visual hallucinations.  Patient denies having paranoid thoughts. ?  ?The patient is able to verbalize their individual safety plan to this provider. ?  ?It is recommended to the patient to continue psychiatric medications as prescribed, after discharge from the hospital.   ?  ?It is recommended to the patient to follow up with your outpatient psychiatric provider and PCP. ?  ?Discussed with the patient, the impact of alcohol, drugs, tobacco have been there overall psychiatric and medical wellbeing, and total abstinence from substance use was recommended the patient. ? ?Physical Findings: ?CIWA:  CIWA-Ar Total: 3 ? ?Musculoskeletal: ?Strength & Muscle Tone: within normal limits ?Gait & Station: normal ?Patient leans: N/A ? ? ?Psychiatric Specialty Exam: ? ?Presentation  ?General Appearance: Appropriate for Environment; Casual; Fairly Groomed ? ?Eye Contact:Good ? ?Speech:Normal Rate ? ?Speech Volume:Normal ? ?Handedness:Right ? ? ?Mood and Affect  ?Mood:Euthymic ? ?Affect:Appropriate; Congruent; Full Range ? ? ?Thought Process  ?Thought Processes:Linear ? ?Descriptions of Associations:Intact ? ?Orientation:Full (Time, Place and Person) ? ?Thought Content:Logical ? ?  History of Schizophrenia/Schizoaffective disorder:No ? ?Duration of Psychotic Symptoms:No data recorded ?Hallucinations:Hallucinations: None ? ?Ideas of Reference:None ? ?Suicidal Thoughts:Suicidal Thoughts: No ? ?Homicidal Thoughts:Homicidal Thoughts: No ? ? ?Sensorium  ?Memory:Immediate Good; Recent Good; Remote Good ? ?Judgment:Fair ? ?Insight:Fair ? ? ?Executive Functions   ?Concentration:Fair ? ?Attention Span:Fair ? ?Recall:Fair ? ?[place] ? ?Language:Fair ? ? ?Psychomotor Activity  ?Psychomotor Activity:Psychomotor Activity: Normal ? ? ?Assets  ?Assets:Communication Skills; Desire for Improvemen

## 2021-07-22 NOTE — [facility] Group Notes (Signed)
PT attended NA and was attentive. ?

## 2021-07-22 NOTE — Progress Notes (Signed)
[facility] MD Progress Note ? ?07/22/2021 10:46 AM ?Barry Moore  ?MRN:  [id number] ?Principal Problem: MDD (major depressive disorder), recurrent severe, without psychosis ([place]) ?Diagnosis: Principal Problem: ?  MDD (major depressive disorder), recurrent severe, without psychosis ([place]) ? ?Reason for admission: ?Barry Moore is a 56 y.o. male who presented to [facility] voluntarily from [facility] with suicidal ideation and intoxication. Past Psychiatric History pertinent for MDD. Past Medical History pertinent for Alcoholic hepatitis, alcohol abuse, HTN, pancreatitis, seizures. This is hospitalization day 6.  ? ?Recommendations made by yesterday's psychiatry team: ?-Continue Zoloft 50 mg qd ?-Continue Trazodone 100 mg qhs ?-Continue Gabapentin 100 tid ? ?Subjective: ?Patient seen and assessed in day room.  Patient reports feeling feeling good with no acute complaints at this time.  ?Patient reports no longer having any tremors or nighttime diaphoresis. ?Patient reports feeling more euthymic today. ?Patient reports appetite and energy continue to improve. ?Patient denies suicidal/homicidal ideations.  Patient reports that he no longer has these thoughts of hurting brother-in-law and would like to move past these thoughts. ?Patient reports sleeping very well last night. ? ?Patient reports continued interest in substance use rehab for his alcohol use.  Patient has been provided resources for backup options for substance rehab if he does not qualify for [facility] rehab program.  Patient reports that he will try to find alternative options and will consider sober living of [place] as an option as well. ? ?Objective: ?Chart Review ?Past 24 hours of patient's chart was reviewed.  ?Patient is compliant with scheduled meds. ?Required Agitation PRNs: none ?Per RN notes, no documented behavioral issues and is  attending group. ?Patient slept, 6.25 hours ? ? ?Total Time spent with patient: 30 minutes ? ?Past Psychiatric History: see H&P ? ?Past  Medical History:  ?Past Medical History:  ?Diagnosis Date  ? Alcoholic hepatitis   ? Depression   ? ETOH abuse   ? Gastric bleed 08/2018  ? Hypertension   ? Pancreatitis   ? Seizures ([place])   ? Suicidal behavior   ?  ?Past Surgical History:  ?Procedure Laterality Date  ? BIOPSY  08/05/2018  ? Procedure: BIOPSY;  Surgeon: Irving Copas., MD;  Location: [place];  Service: Gastroenterology;;  ? BIOPSY  08/07/2018  ? Procedure: BIOPSY;  Surgeon: Thornton Park, MD;  Location: [place];  Service: Gastroenterology;;  ? BIOPSY  01/02/2019  ? Procedure: BIOPSY;  Surgeon: Gatha Mayer, MD;  Location: [facility];  Service: Endoscopy;;  ? COLONOSCOPY WITH PROPOFOL N/A 08/07/2018  ? Procedure: COLONOSCOPY WITH PROPOFOL;  Surgeon: Thornton Park, MD;  Location: [place];  Service: Gastroenterology;  Laterality: N/A;  ? ESOPHAGOGASTRODUODENOSCOPY N/A 01/02/2019  ? Procedure: ESOPHAGOGASTRODUODENOSCOPY (EGD);  Surgeon: Gatha Mayer, MD;  Location: [facility];  Service: Endoscopy;  Laterality: N/A;  ? ESOPHAGOGASTRODUODENOSCOPY N/A 07/03/2019  ? Procedure: ESOPHAGOGASTRODUODENOSCOPY (EGD);  Surgeon: Wonda Horner, MD;  Location: [facility];  Service: Endoscopy;  Laterality: N/A;  ? ESOPHAGOGASTRODUODENOSCOPY N/A 06/26/2020  ? Procedure: ESOPHAGOGASTRODUODENOSCOPY (EGD);  Surgeon: Arta Silence, MD;  Location: [facility];  Service: Endoscopy;  Laterality: N/A;  ? ESOPHAGOGASTRODUODENOSCOPY (EGD) WITH PROPOFOL N/A 11/02/2016  ? Procedure: ESOPHAGOGASTRODUODENOSCOPY (EGD) WITH PROPOFOL;  Surgeon: Carol Ada, MD;  Location: [facility];  Service: Endoscopy;  Laterality: N/A;  ? ESOPHAGOGASTRODUODENOSCOPY (EGD) WITH PROPOFOL N/A 08/05/2018  ? Procedure: ESOPHAGOGASTRODUODENOSCOPY (EGD) WITH PROPOFOL;  Surgeon: Rush Landmark Telford Nab., MD;  Location: [place];  Service: Gastroenterology;  Laterality: N/A;  ? ESOPHAGOGASTRODUODENOSCOPY (EGD) WITH PROPOFOL N/A 07/14/2019  ? Procedure:  ESOPHAGOGASTRODUODENOSCOPY (EGD) WITH PROPOFOL;  Surgeon: Otis Brace, MD;  Location: [facility];  Service: Gastroenterology;  Laterality: N/A;  ? HERNIA REPAIR    ? LEG SURGERY    ? POLYPECTOMY  08/07/2018  ? Procedure: POLYPECTOMY;  Surgeon: Thornton Park, MD;  Location: [place];  Service: Gastroenterology;;  ? ?Family History:  ?Family History  ?Problem Relation Age of Onset  ? Diabetes Mother   ? Alcoholism Mother   ? Emphysema Father   ? Lung cancer Father   ? Alcoholism Father   ? ?Family Psychiatric  History: see H&P ?Social History:  ?Social History  ? ?Substance and Sexual Activity  ?Alcohol Use Yes  ? Comment: 5+ BEERS DAILY, Patient does not know how much he drank tonight  ?   ?Social History  ? ?Substance and Sexual Activity  ?Drug Use Not Currently  ? Frequency: 3.0 times per week  ? Types: Cocaine  ?  ?Social History  ? ?Socioeconomic History  ? Marital status: Divorced  ?  Spouse name: Not on file  ? Number of children: Not on file  ? Years of education: Not on file  ? Highest education level: Not on file  ?Occupational History  ? Not on file  ?Tobacco Use  ? Smoking status: Every Day  ?  Packs/day: 1.00  ?  Types: Cigarettes  ? Smokeless tobacco: Never  ?Vaping Use  ? Vaping Use: Never used  ?Substance and Sexual Activity  ? Alcohol use: Yes  ?  Comment: 5+ BEERS DAILY, Patient does not know how much he drank tonight  ? Drug use: Not Currently  ?  Frequency: 3.0 times per week  ?  Types: Cocaine  ? Sexual activity: Not Currently  ?Other Topics Concern  ? Not on file  ?Social History Narrative  ? Not on file  ? ?Social Determinants of Health  ? ?Financial Resource Strain: Not on file  ?Food Insecurity: Not on file  ?Transportation Needs: Not on file  ?Physical Activity: Not on file  ?Stress: Not on file  ?Social Connections: Not on file  ? ?Additional Social History:  ?  ?  ?  ?  ?  ?  ?  ?  ?  ?  ?  ? ?Sleep: Good ? ?Appetite:  Good ? ?Current Medications: ?Current  Facility-Administered Medications  ?Medication Dose Route Frequency Provider Last Rate Last Admin  ? acetaminophen (TYLENOL) tablet 650 mg  650 mg Oral Q6H PRN Briant Cedar, MD   650 mg at 07/17/21 5852  ? alum & mag hydroxide-simeth (MAALOX/MYLANTA) 200-200-20 MG/5ML suspension 30 mL  30 mL Oral Q4H PRN Briant Cedar, MD      ? cloNIDine (CATAPRES) tablet 0.1 mg  0.1 mg Oral Q8H PRN Nelda Marseille, Amy E, MD   0.1 mg at 07/17/21 0800  ? gabapentin (NEURONTIN) capsule 100 mg  100 mg Oral TID Laretta Bolster, FNP   100 mg at 07/22/21 7782  ? magnesium hydroxide (MILK OF MAGNESIA) suspension 30 mL  30 mL Oral Daily PRN Briant Cedar, MD      ? multivitamin with minerals tablet 1 tablet  1 tablet Oral Daily Briant Cedar, MD   1 tablet at 07/22/21 4235  ? sertraline (ZOLOFT) tablet 50 mg  50 mg Oral Daily France Ravens, MD   50 mg at 07/22/21 0755  ? thiamine tablet 100 mg  100 mg Oral Daily Briant Cedar, MD   100 mg at 07/22/21 0755  ? traZODone (  DESYREL) tablet 100 mg  100 mg Oral QHS France Ravens, MD   100 mg at 07/21/21 2122  ? ? ?Lab Results:  ?Results for orders placed or performed during the hospital encounter of 07/16/21 (from the past 48 hour(s))  ?Comprehensive metabolic panel     Status: Abnormal  ? Collection Time: 07/20/21  6:39 PM  ?Result Value Ref Range  ? Sodium 138 135 - 145 mmol/L  ? Potassium 4.1 3.5 - 5.1 mmol/L  ? Chloride 104 98 - 111 mmol/L  ? CO2 28 22 - 32 mmol/L  ? Glucose, Bld 84 70 - 99 mg/dL  ?  Comment: Glucose reference range applies only to samples taken after fasting for at least 8 hours.  ? BUN 17 6 - 20 mg/dL  ? Creatinine, Ser 0.79 0.61 - 1.24 mg/dL  ? Calcium 9.5 8.9 - 10.3 mg/dL  ? Total Protein 7.9 6.5 - 8.1 g/dL  ? Albumin 4.1 3.5 - 5.0 g/dL  ? AST 57 (H) 15 - 41 U/L  ? ALT 41 0 - 44 U/L  ? Alkaline Phosphatase 157 (H) 38 - 126 U/L  ? Total Bilirubin 0.5 0.3 - 1.2 mg/dL  ? GFR, Estimated >60 >60 mL/min  ?  Comment: (NOTE) ?Calculated using the  CKD-EPI Creatinine Equation (2021) ?  ? Anion gap 6 5 - 15  ?  Comment: Performed at [facility], [place] [address], [place], [place] [address]  ? ? ?Blood Alcohol level:  ?Lab Results  ?Component Value Date  ? ETH <10

## 2021-07-23 DIAGNOSIS — F332 Major depressive disorder, recurrent severe without psychotic features: Principal | ICD-10-CM

## 2021-07-23 NOTE — Progress Notes (Signed)
Pt discharged to lobby. Pt was stable and appreciative at that time. All papers, samples and prescriptions were given and valuables returned. Verbal understanding expressed. Denies SI/HI and A/VH. Pt given opportunity to express concerns and ask questions.  

## 2021-07-23 NOTE — Plan of Care (Signed)
°  Problem: Education: °Goal: Emotional status will improve °Outcome: Progressing °Goal: Mental status will improve °Outcome: Progressing °Goal: Verbalization of understanding the information provided will improve °Outcome: Progressing °  °

## 2021-07-23 NOTE — Progress Notes (Signed)
?  [facility] Adult Case Management Discharge Plan : ? ?Will you be returning to the same living situation after discharge:  No. Will be discharging to [facility] ?At discharge, do you have transportation home?: No. Taxi will be arranged ?Do you have the ability to pay for your medications: No. Samples to be provided to this patient ? ?Release of information consent forms completed and in the chart;  Patient's signature needed at discharge. ? ?Patient to Follow up at: ? Follow-up Information   ? ? [place]. Go to.   ?Specialty: Behavioral Health ?Why: You may go to this provider for therapy and medication management services on walk in days:  Mondays and Wednesdays, arrive at 7:30 am.  Services are provided on a first come, first served basis for [facility] residents. ?Contact information: ?[address] ?[place] [address] ?[phone] ? ?  ?  ? ?  ?  ? ?  ? ? ?Next level of care provider has access to [place] ? ?Safety Planning and Suicide Prevention discussed: Yes,  with sister ? ?  ? ?Has patient been referred to the Quitline?: Patient refused referral ? ?Patient has been referred for addiction treatment: Yes ? ?Vassie Moselle, LCSW ?07/23/2021, 8:22 AM ?

## 2021-07-23 NOTE — Progress Notes (Signed)
? ? ?   07/22/21 2138  ?Psych Admission Type (Psych Patients Only)  ?Admission Status Voluntary  ?Psychosocial Assessment  ?Patient Complaints Anxiety  ?Eye Contact Brief  ?Facial Expression Sad  ?Affect Appropriate to circumstance  ?Speech Logical/coherent  ?Interaction Assertive  ?Motor Activity Slow  ?Appearance/Hygiene Unremarkable  ?Behavior Characteristics Cooperative  ?Mood Pleasant  ?Thought Process  ?Coherency WDL  ?Content WDL  ?Delusions None reported or observed  ?Perception WDL  ?Hallucination None reported or observed  ?Judgment Poor  ?Confusion WDL  ?Danger to Self  ?Current suicidal ideation? Denies  ?Self-Injurious Behavior No self-injurious ideation or behavior indicators observed or expressed   ?Agreement Not to Harm Self Yes  ?Description of Agreement verbal contract for safety  ?Danger to Others  ?Danger to Others None reported or observed  ?Danger to Others Abnormal  ?Harmful Behavior to others No threats or harm toward other people  ? ? ?

## 2021-08-07 ENCOUNTER — Emergency Department (HOSPITAL_COMMUNITY)
Admission: EM | Admit: 2021-08-07 | Discharge: 2021-08-08 | Disposition: A | Discharge status: Home / Self Care | Payer: Medicaid - Out of State | Attending: Emergency Medicine | Admitting: Emergency Medicine

## 2021-08-07 ENCOUNTER — Encounter (HOSPITAL_COMMUNITY): Payer: Self-pay

## 2021-08-07 ENCOUNTER — Other Ambulatory Visit: Payer: Self-pay

## 2021-08-07 DIAGNOSIS — Y908 Blood alcohol level of 240 mg/100 ml or more: Secondary | ICD-10-CM | POA: Diagnosis not present

## 2021-08-07 DIAGNOSIS — Z5900 Homelessness unspecified: Secondary | ICD-10-CM | POA: Diagnosis not present

## 2021-08-07 DIAGNOSIS — F1094 Alcohol use, unspecified with alcohol-induced mood disorder: Secondary | ICD-10-CM | POA: Insufficient documentation

## 2021-08-07 DIAGNOSIS — F1090 Alcohol use, unspecified, uncomplicated: Secondary | ICD-10-CM

## 2021-08-07 DIAGNOSIS — Z79899 Other long term (current) drug therapy: Secondary | ICD-10-CM | POA: Diagnosis not present

## 2021-08-07 DIAGNOSIS — F10920 Alcohol use, unspecified with intoxication, uncomplicated: Secondary | ICD-10-CM

## 2021-08-07 DIAGNOSIS — I10 Essential (primary) hypertension: Secondary | ICD-10-CM | POA: Diagnosis not present

## 2021-08-07 DIAGNOSIS — Z20822 Contact with and (suspected) exposure to covid-19: Secondary | ICD-10-CM | POA: Diagnosis not present

## 2021-08-07 DIAGNOSIS — F32A Depression, unspecified: Secondary | ICD-10-CM

## 2021-08-07 LAB — HEPATIC FUNCTION PANEL
ALT: 21 U/L (ref 0–44)
AST: 42 U/L — ABNORMAL HIGH (ref 15–41)
Albumin: 4.3 g/dL (ref 3.5–5.0)
Alkaline Phosphatase: 112 U/L (ref 38–126)
Bilirubin, Direct: 0.1 mg/dL (ref 0.0–0.2)
Indirect Bilirubin: 0.4 mg/dL (ref 0.3–0.9)
Total Bilirubin: 0.5 mg/dL (ref 0.3–1.2)
Total Protein: 8.4 g/dL — ABNORMAL HIGH (ref 6.5–8.1)

## 2021-08-07 LAB — CBC WITH DIFFERENTIAL/PLATELET
Abs Immature Granulocytes: 0.01 10*3/uL (ref 0.00–0.07)
Basophils Absolute: 0.1 10*3/uL (ref 0.0–0.1)
Basophils Relative: 1 %
Eosinophils Absolute: 0.2 10*3/uL (ref 0.0–0.5)
Eosinophils Relative: 3 %
HCT: 42.7 % (ref 39.0–52.0)
Hemoglobin: 13.7 g/dL (ref 13.0–17.0)
Immature Granulocytes: 0 %
Lymphocytes Relative: 48 %
Lymphs Abs: 2.5 10*3/uL (ref 0.7–4.0)
MCH: 27.3 pg (ref 26.0–34.0)
MCHC: 32.1 g/dL (ref 30.0–36.0)
MCV: 85.1 fL (ref 80.0–100.0)
Monocytes Absolute: 0.6 10*3/uL (ref 0.1–1.0)
Monocytes Relative: 12 %
Neutro Abs: 1.9 10*3/uL (ref 1.7–7.7)
Neutrophils Relative %: 36 %
Platelets: 176 10*3/uL (ref 150–400)
RBC: 5.02 MIL/uL (ref 4.22–5.81)
RDW: 18.4 % — ABNORMAL HIGH (ref 11.5–15.5)
WBC: 5.2 10*3/uL (ref 4.0–10.5)
nRBC: 0 % (ref 0.0–0.2)

## 2021-08-07 LAB — BASIC METABOLIC PANEL
Anion gap: 10 (ref 5–15)
BUN: 7 mg/dL (ref 6–20)
CO2: 28 mmol/L (ref 22–32)
Calcium: 9 mg/dL (ref 8.9–10.3)
Chloride: 106 mmol/L (ref 98–111)
Creatinine, Ser: 0.73 mg/dL (ref 0.61–1.24)
GFR, Estimated: >60 mL/min (ref >60)
Glucose, Bld: 99 mg/dL (ref 70–99)
Potassium: 3.9 mmol/L (ref 3.5–5.1)
Sodium: 144 mmol/L (ref 135–145)

## 2021-08-07 LAB — RESP PANEL BY RT-PCR (FLU A&B, COVID) ARPGX2
Influenza A by PCR: NEGATIVE
Influenza B by PCR: NEGATIVE
SARS Coronavirus 2 by RT PCR: NEGATIVE

## 2021-08-07 LAB — ETHANOL: Alcohol, Ethyl (B): 386 mg/dL (ref <10)

## 2021-08-07 LAB — ACETAMINOPHEN LEVEL: Acetaminophen (Tylenol), Serum: <10 ug/mL — ABNORMAL LOW (ref 10–30)

## 2021-08-07 MED ORDER — LORAZEPAM 1 MG PO TABS
0.0000 mg | ORAL_TABLET | Freq: Four times a day (QID) | ORAL | Status: DC
  Administered 2021-08-07: 2 mg via ORAL
  Administered 2021-08-08: 1 mg via ORAL
  Filled 2021-08-07: qty 1
  Filled 2021-08-07: qty 2

## 2021-08-07 MED ORDER — THIAMINE HCL 100 MG/ML IJ SOLN: 100.0000 mg | Freq: Every day | INTRAMUSCULAR | Status: DC

## 2021-08-07 MED ORDER — ONDANSETRON HCL 4 MG PO TABS: 4.0000 mg | ORAL_TABLET | Freq: Three times a day (TID) | ORAL | Status: DC | PRN

## 2021-08-07 MED ORDER — THIAMINE HCL 100 MG PO TABS: 100.0000 mg | ORAL_TABLET | Freq: Every day | ORAL | Status: DC

## 2021-08-07 MED ORDER — LORAZEPAM 1 MG PO TABS: 0.0000 mg | ORAL_TABLET | Freq: Two times a day (BID) | ORAL | Status: DC

## 2021-08-07 MED ORDER — LORAZEPAM 2 MG/ML IJ SOLN: 0.0000 mg | Freq: Two times a day (BID) | INTRAMUSCULAR | Status: DC

## 2021-08-07 MED ORDER — LORAZEPAM 2 MG/ML IJ SOLN: 0.0000 mg | Freq: Four times a day (QID) | INTRAMUSCULAR | Status: DC

## 2021-08-07 MED ORDER — ALUM & MAG HYDROXIDE-SIMETH 200-200-20 MG/5ML PO SUSP: 30.0000 mL | Freq: Four times a day (QID) | ORAL | Status: DC | PRN

## 2021-08-07 MED ORDER — NICOTINE 21 MG/24HR TD PT24: 21.0000 mg | MEDICATED_PATCH | Freq: Every day | TRANSDERMAL | Status: DC

## 2021-08-07 MED ORDER — ONDANSETRON 4 MG PO TBDP
4.0000 mg | ORAL_TABLET | Freq: Once | ORAL | Status: AC
  Administered 2021-08-07: 4 mg via ORAL
  Filled 2021-08-07: qty 1

## 2021-08-07 NOTE — ED Notes (Addendum)
Pt awake at this time. Pt given scrubs to change into and belongings placed in 1 bag and put in cupboard in nurses station. Pt provided soda and states no further needs. Pt was able to ambulate independently to bathroom and back to change. Pt remains pleasant.

## 2021-08-07 NOTE — ED Provider Notes (Signed)
Pt signed out by Dr. Tomi Bamberger pending labs and metabolization of etoh back to baseline.  Labs:  CBC nl, bmp nl; etoh 386; acet <10; covid/flu neg  Pt asleep in the hallway for several hrs.  He is now awake and alert, but feels like he is withdrawing.  He feels shaky and nauseous.  He admits to depression.  He denies a specific suicide plan, but said it would be fine if he died.    CIWA orders and meds placed.  Withdrawal sx are mild and I don't think he needs admission.  He is stable for TTS consult.     Isla Pence, MD 08/07/21 2255

## 2021-08-07 NOTE — ED Notes (Signed)
Dr Gilford Raid notified that pt is experiencing withdrawal symptoms - CIWA 12.

## 2021-08-07 NOTE — ED Triage Notes (Signed)
Patient BIB [facility] from [facility] [address]. Patient states he has been drinking and feeling more depressed. Patient does have thoughts of suicide and a plan.

## 2021-08-07 NOTE — ED Provider Notes (Signed)
[place] DEPT Provider Note   CSN: [id number] Arrival date & time: 08/07/21  1327     History  CC: alcohol intoxication  Barry Moore is a 56 y.o. male.  HPI  Patient has history of hypertension seizures alcohol abuse, pancreatitis hepatitis.  Patient told EMS that he had been drinking today and was seen feeling more depressed.  Patient indicated he had thoughts of suicide and a plan.  When I spoke with the patient I asked him what brought him to the ED today Barry Moore admits to drinking too much alcohol.  He states he had 2 seizures and that is what brought him to the emergency room.  He did not mention having any thoughts of depression or suicide.  Home Medications Prior to Admission medications   Medication Sig Start Date End Date Taking? Authorizing Provider  gabapentin (NEURONTIN) 100 MG capsule Take 1 capsule (100 mg total) by mouth 3 (three) times daily. 07/22/21 08/21/21  Massengill, Ovid Curd, MD  sertraline (ZOLOFT) 50 MG tablet Take 1 tablet (50 mg total) by mouth daily. 07/23/21 08/22/21  Massengill, Ovid Curd, MD  traZODone (DESYREL) 100 MG tablet Take 1 tablet (100 mg total) by mouth at bedtime. 07/22/21 08/21/21  Massengill, Ovid Curd, MD  amLODipine (NORVASC) 5 MG tablet Take 1 tablet (5 mg total) by mouth daily. Patient not taking: Reported on 08/14/2020 06/27/20 08/14/20  Charlynne Cousins, MD  ferrous sulfate 325 (65 FE) MG tablet Take 1 tablet (325 mg total) by mouth daily with breakfast. Patient not taking: Reported on 09/23/2018 08/09/18 10/09/18  Elodia Florence., MD  pantoprazole (PROTONIX) 40 MG tablet Take 1 tablet (40 mg total) by mouth 2 (two) times daily before a meal. Patient not taking: Reported on 08/14/2020 06/27/20 08/14/20  Charlynne Cousins, MD      Allergies    Tomato, Aspirin, and Sulfa antibiotics    Review of Systems   Review of Systems  Constitutional:  Negative for fever.   Physical Exam Updated Vital Signs BP 110/61 (BP  Location: Right Arm)   Pulse 69   Temp (!) 97.5 F (36.4 C) (Oral)   Resp 15   Ht 1.727 m ('5\' 8"'$ )   Wt 70 kg   SpO2 98%   BMI 23.46 kg/m  Physical Exam Vitals and nursing note reviewed.  Constitutional:      Appearance: He is well-developed. He is not diaphoretic.     Comments: Somnolent but wakes up when spoken to  HENT:     Head: Normocephalic and atraumatic.     Right Ear: External ear normal.     Left Ear: External ear normal.  Eyes:     General: No scleral icterus.       Right eye: No discharge.        Left eye: No discharge.     Conjunctiva/sclera: Conjunctivae normal.  Neck:     Trachea: No tracheal deviation.  Cardiovascular:     Rate and Rhythm: Normal rate.  Pulmonary:     Effort: Pulmonary effort is normal. No respiratory distress.     Breath sounds: No stridor.  Abdominal:     General: There is no distension.  Musculoskeletal:        General: No swelling or deformity.     Cervical back: Neck supple.  Skin:    General: Skin is warm and dry.     Findings: No rash.  Neurological:     Mental Status: He is alert.  Cranial Nerves: Cranial nerve deficit: no gross deficits.     Comments: Slow to answer questions but otherwise answering appropriately, does appear intoxicated  Psychiatric:     Comments: Normal mood    ED Results / Procedures / Treatments   Labs (all labs ordered are listed, but only abnormal results are displayed) Labs Reviewed  CBC WITH DIFFERENTIAL/PLATELET - Abnormal; Notable for the following components:      Result Value   RDW 18.4 (*)    All other components within normal limits  RESP PANEL BY RT-PCR (FLU A&B, COVID) ARPGX2  BASIC METABOLIC PANEL  ETHANOL  ACETAMINOPHEN LEVEL  RAPID URINE DRUG SCREEN, HOSP PERFORMED  HEPATIC FUNCTION PANEL    EKG None  Radiology No results found.  Procedures Procedures    Medications Ordered in ED Medications - No data to display  ED Course/ Medical Decision Making/ A&P Clinical  Course as of 08/07/21 1516  Fri Aug 07, 2021  1513 CBC with Differential(!) Normal [JK]  9169 Basic metabolic panel Normal [JK]    Clinical Course User Index [JK] Dorie Rank, MD                           Medical Decision Making Problems Addressed: Alcohol use disorder: complicated acute illness or injury Homelessness: chronic illness or injury  Amount and/or Complexity of Data Reviewed Labs: ordered. Decision-making details documented in ED Course.  Risk Diagnosis or treatment significantly limited by social determinants of health.   Patient with history of alcohol intoxication and depression.  Seen several times recently for similar symptoms.  Patient was last admitted to the behavioral health unit last month for depression.  Will check labs.  Patient will need to be evaluated once clinically sober to see if he contracts for safety on        Final Clinical Impression(s) / ED Diagnoses Final diagnoses:  Alcohol use disorder  Homelessness    Rx / DC Orders ED Discharge Orders     None         Dorie Rank, MD 08/07/21 1516

## 2021-08-08 LAB — RAPID URINE DRUG SCREEN, HOSP PERFORMED
Amphetamines: NOT DETECTED
Barbiturates: NOT DETECTED
Benzodiazepines: POSITIVE — AB
Cocaine: NOT DETECTED
Opiates: NOT DETECTED
Tetrahydrocannabinol: NOT DETECTED

## 2021-08-08 NOTE — ED Notes (Signed)
TTS completed at this time.
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
## 2021-08-08 NOTE — ED Provider Notes (Signed)
  Physical Exam  BP (!) 171/90   Pulse 79   Temp 98.7 F (37.1 C)   Resp 18   Ht '5\' 8"'$  (1.727 m)   Wt 70 kg   SpO2 95%   BMI 23.46 kg/m   Physical Exam  Procedures  Procedures  ED Course / MDM   Clinical Course as of 08/08/21 1006  Fri Aug 07, 2021  1513 CBC with Differential(!) Normal [JK]  9923 Basic metabolic panel Normal [JK]    Clinical Course User Index [JK] Dorie Rank, MD   Medical Decision Making Amount and/or Complexity of Data Reviewed Labs: ordered. Decision-making details documented in ED Course.  Risk OTC drugs. Prescription drug management.   Patient been psychiatrically cleared.  Now more awake and appropriate.  Up and ambulatory.  Will discharge.       Davonna Belling, MD 08/08/21 1006

## 2021-08-18 ENCOUNTER — Other Ambulatory Visit: Payer: Self-pay

## 2021-08-18 ENCOUNTER — Inpatient Hospital Stay (HOSPITAL_COMMUNITY)
Admission: EM | Admit: 2021-08-18 | Discharge: 2021-08-22 | DRG: 897 | Disposition: A | Discharge status: Home / Self Care | Payer: Medicaid - Out of State | Attending: Internal Medicine | Admitting: Internal Medicine

## 2021-08-18 ENCOUNTER — Encounter (HOSPITAL_COMMUNITY): Payer: Self-pay | Admitting: Emergency Medicine

## 2021-08-18 DIAGNOSIS — Z91018 Allergy to other foods: Secondary | ICD-10-CM

## 2021-08-18 DIAGNOSIS — Z79899 Other long term (current) drug therapy: Secondary | ICD-10-CM

## 2021-08-18 DIAGNOSIS — E440 Moderate protein-calorie malnutrition: Secondary | ICD-10-CM | POA: Diagnosis present

## 2021-08-18 DIAGNOSIS — R030 Elevated blood-pressure reading, without diagnosis of hypertension: Secondary | ICD-10-CM

## 2021-08-18 DIAGNOSIS — Z5900 Homelessness unspecified: Secondary | ICD-10-CM

## 2021-08-18 DIAGNOSIS — F17210 Nicotine dependence, cigarettes, uncomplicated: Secondary | ICD-10-CM | POA: Diagnosis present

## 2021-08-18 DIAGNOSIS — D696 Thrombocytopenia, unspecified: Secondary | ICD-10-CM | POA: Diagnosis present

## 2021-08-18 DIAGNOSIS — Y908 Blood alcohol level of 240 mg/100 ml or more: Secondary | ICD-10-CM | POA: Diagnosis present

## 2021-08-18 DIAGNOSIS — F10939 Alcohol use, unspecified with withdrawal, unspecified: Secondary | ICD-10-CM | POA: Diagnosis present

## 2021-08-18 DIAGNOSIS — R7401 Elevation of levels of liver transaminase levels: Secondary | ICD-10-CM | POA: Diagnosis present

## 2021-08-18 DIAGNOSIS — Z882 Allergy status to sulfonamides status: Secondary | ICD-10-CM

## 2021-08-18 DIAGNOSIS — D638 Anemia in other chronic diseases classified elsewhere: Secondary | ICD-10-CM | POA: Diagnosis present

## 2021-08-18 DIAGNOSIS — R45851 Suicidal ideations: Secondary | ICD-10-CM

## 2021-08-18 DIAGNOSIS — F10239 Alcohol dependence with withdrawal, unspecified: Principal | ICD-10-CM | POA: Diagnosis present

## 2021-08-18 DIAGNOSIS — F332 Major depressive disorder, recurrent severe without psychotic features: Secondary | ICD-10-CM | POA: Diagnosis present

## 2021-08-18 DIAGNOSIS — F1020 Alcohol dependence, uncomplicated: Secondary | ICD-10-CM | POA: Diagnosis present

## 2021-08-18 DIAGNOSIS — F1014 Alcohol abuse with alcohol-induced mood disorder: Secondary | ICD-10-CM

## 2021-08-18 DIAGNOSIS — Z886 Allergy status to analgesic agent status: Secondary | ICD-10-CM

## 2021-08-18 DIAGNOSIS — R7989 Other specified abnormal findings of blood chemistry: Secondary | ICD-10-CM | POA: Diagnosis present

## 2021-08-18 DIAGNOSIS — Z6823 Body mass index (BMI) 23.0-23.9, adult: Secondary | ICD-10-CM

## 2021-08-18 DIAGNOSIS — F10229 Alcohol dependence with intoxication, unspecified: Secondary | ICD-10-CM | POA: Diagnosis present

## 2021-08-18 DIAGNOSIS — F10920 Alcohol use, unspecified with intoxication, uncomplicated: Principal | ICD-10-CM

## 2021-08-18 DIAGNOSIS — I10 Essential (primary) hypertension: Secondary | ICD-10-CM | POA: Diagnosis present

## 2021-08-18 LAB — CBC
HCT: 37.3 % — ABNORMAL LOW (ref 39.0–52.0)
Hemoglobin: 12.2 g/dL — ABNORMAL LOW (ref 13.0–17.0)
MCH: 27.7 pg (ref 26.0–34.0)
MCHC: 32.7 g/dL (ref 30.0–36.0)
MCV: 84.6 fL (ref 80.0–100.0)
Platelets: 95 10*3/uL — ABNORMAL LOW (ref 150–400)
RBC: 4.41 MIL/uL (ref 4.22–5.81)
RDW: 18.8 % — ABNORMAL HIGH (ref 11.5–15.5)
WBC: 5.7 10*3/uL (ref 4.0–10.5)
nRBC: 0 % (ref 0.0–0.2)

## 2021-08-18 LAB — ACETAMINOPHEN LEVEL: Acetaminophen (Tylenol), Serum: <10 ug/mL — ABNORMAL LOW (ref 10–30)

## 2021-08-18 LAB — COMPREHENSIVE METABOLIC PANEL
ALT: 28 U/L (ref 0–44)
AST: 61 U/L — ABNORMAL HIGH (ref 15–41)
Albumin: 4.5 g/dL (ref 3.5–5.0)
Alkaline Phosphatase: 118 U/L (ref 38–126)
Anion gap: 7 (ref 5–15)
BUN: 9 mg/dL (ref 6–20)
CO2: 28 mmol/L (ref 22–32)
Calcium: 8.9 mg/dL (ref 8.9–10.3)
Chloride: 105 mmol/L (ref 98–111)
Creatinine, Ser: 0.87 mg/dL (ref 0.61–1.24)
GFR, Estimated: >60 mL/min (ref >60)
Glucose, Bld: 104 mg/dL — ABNORMAL HIGH (ref 70–99)
Potassium: 3.7 mmol/L (ref 3.5–5.1)
Sodium: 140 mmol/L (ref 135–145)
Total Bilirubin: 0.5 mg/dL (ref 0.3–1.2)
Total Protein: 8.4 g/dL — ABNORMAL HIGH (ref 6.5–8.1)

## 2021-08-18 LAB — RAPID URINE DRUG SCREEN, HOSP PERFORMED
Amphetamines: NOT DETECTED
Barbiturates: NOT DETECTED
Benzodiazepines: NOT DETECTED
Cocaine: NOT DETECTED
Opiates: NOT DETECTED
Tetrahydrocannabinol: NOT DETECTED

## 2021-08-18 LAB — SALICYLATE LEVEL: Salicylate Lvl: <7 mg/dL — ABNORMAL LOW (ref 7.0–30.0)

## 2021-08-18 LAB — ETHANOL: Alcohol, Ethyl (B): 421 mg/dL (ref <10)

## 2021-08-18 NOTE — ED Notes (Signed)
Pt wanded by security, pt's belongings removed.

## 2021-08-18 NOTE — ED Notes (Signed)
Pt changed into burgundy scrubs and belongings in a patient belonging bag

## 2021-08-18 NOTE — ED Triage Notes (Signed)
Pt arrived via EMS. Pt reports drinking 5 beers tonight. Pt reported to EMS that he has SI. Pt told EMS that he had seizure activity earlier tonight. Pt is intoxicated at this time.

## 2021-08-18 NOTE — Discharge Instructions (Addendum)
It was our pleasure to provide your ER care today - we hope that you feel better.  Drink plenty of (non-alcohol containing) fluids/stay well hydrated.   Avoid alcohol use as it is harmful to your physical health and mental well-being.  See resource guide provided for treatment programs in the area, as well as for behavioral health and other social services resources in the community.   For mental health issues and/or crisis, you may also go directly to the [facility] - it is open 24/7 and walk-ins are welcome.   Follow up with primary care doctor in the next 1-2 weeks - also have blood pressure rechecked then as it is high.  Return to ER if worse, new symptoms, fevers, chest pain, trouble breathing, or other concern.

## 2021-08-18 NOTE — ED Provider Notes (Signed)
[facility] DEPT Provider Note   CSN: [id number] Arrival date & time: 08/18/21  2126     History  Chief Complaint  Patient presents with   Alcohol Problem   Depression    Barry Moore is a 56 y.o. male.  Patient w hx etoh use disorder, c/o etoh use, and feelings of depression. Patient poor historian - indicates long hx etoh use disorder, and that intermittently he has feelings of anxiety and depression due to homelessness and ongoing etoh use. Denies new, acute or inciting event or stressor. Denies suicidal plan or wanting to harm/kill self. Denies hx dts or complicated etoh withdrawal. Denies acute physical c/o or pain. No trauma/fall. No headache. No cp or sob. No abd pain or nv.   The history is provided by the patient, medical records and the EMS personnel. The history is limited by the condition of the patient.  Alcohol Problem Pertinent negatives include no chest pain, no abdominal pain, no headaches and no shortness of breath.  Depression Pertinent negatives include no chest pain, no abdominal pain, no headaches and no shortness of breath.      Home Medications Prior to Admission medications   Medication Sig Start Date End Date Taking? Authorizing Provider  gabapentin (NEURONTIN) 100 MG capsule Take 1 capsule (100 mg total) by mouth 3 (three) times daily. Patient not taking: Reported on 08/07/2021 07/22/21 08/21/21  Janine Limbo, MD  sertraline (ZOLOFT) 50 MG tablet Take 1 tablet (50 mg total) by mouth daily. Patient not taking: Reported on 08/07/2021 07/23/21 08/22/21  Janine Limbo, MD  traZODone (DESYREL) 100 MG tablet Take 1 tablet (100 mg total) by mouth at bedtime. Patient not taking: Reported on 08/07/2021 07/22/21 08/21/21  Janine Limbo, MD  amLODipine (NORVASC) 5 MG tablet Take 1 tablet (5 mg total) by mouth daily. Patient not taking: Reported on 08/14/2020 06/27/20 08/14/20  Charlynne Cousins, MD  ferrous sulfate 325 (65 FE) MG  tablet Take 1 tablet (325 mg total) by mouth daily with breakfast. Patient not taking: Reported on 09/23/2018 08/09/18 10/09/18  Elodia Florence., MD  pantoprazole (PROTONIX) 40 MG tablet Take 1 tablet (40 mg total) by mouth 2 (two) times daily before a meal. Patient not taking: Reported on 08/14/2020 06/27/20 08/14/20  Charlynne Cousins, MD      Allergies    Tomato, Aspirin, and Sulfa antibiotics    Review of Systems   Review of Systems  Constitutional:  Negative for fever.  Respiratory:  Negative for shortness of breath.   Cardiovascular:  Negative for chest pain.  Gastrointestinal:  Negative for abdominal pain and vomiting.  Neurological:  Negative for headaches.  Psychiatric/Behavioral:  Positive for depression and dysphoric mood. Negative for self-injury and suicidal ideas.    Physical Exam Updated Vital Signs BP (!) 149/83   Pulse 94   Temp 98.3 F (36.8 C) (Oral)   Resp 18   SpO2 98%  Physical Exam Vitals and nursing note reviewed.  Constitutional:      Appearance: Normal appearance. He is well-developed.  HENT:     Head: Atraumatic.     Nose: Nose normal.     Mouth/Throat:     Mouth: Mucous membranes are moist.     Pharynx: Oropharynx is clear.  Eyes:     General: No scleral icterus.    Pupils: Pupils are equal, round, and reactive to light.  Neck:     Trachea: No tracheal deviation.  Cardiovascular:     Rate  and Rhythm: Normal rate and regular rhythm.     Pulses: Normal pulses.     Heart sounds: Normal heart sounds. No murmur heard.   No friction rub. No gallop.  Pulmonary:     Effort: Pulmonary effort is normal. No accessory muscle usage or respiratory distress.     Breath sounds: Normal breath sounds.  Abdominal:     General: Bowel sounds are normal. There is no distension.     Palpations: Abdomen is soft.     Tenderness: There is no abdominal tenderness.  Genitourinary:    Comments: No cva tenderness. Musculoskeletal:        General: No swelling or  tenderness.     Cervical back: Normal range of motion and neck supple. No rigidity.  Skin:    General: Skin is warm and dry.     Findings: No rash.  Neurological:     Mental Status: He is alert.     Comments: Alert, speech clear. Motor/sens grossly intact bil. No tremor or shakes.   Psychiatric:     Comments: Appears intoxicated. Pt does not appear acutely depressed or despondent. Denies SI/HI. Pt is not responding to internal stimuli - no hallucinations or delusions are noted.     ED Results / Procedures / Treatments   Labs (all labs ordered are listed, but only abnormal results are displayed) Results for orders placed or performed during the hospital encounter of 08/18/21  Comprehensive metabolic panel  Result Value Ref Range   Sodium 140 135 - 145 mmol/L   Potassium 3.7 3.5 - 5.1 mmol/L   Chloride 105 98 - 111 mmol/L   CO2 28 22 - 32 mmol/L   Glucose, Bld 104 (H) 70 - 99 mg/dL   BUN 9 6 - 20 mg/dL   Creatinine, Ser 0.87 0.61 - 1.24 mg/dL   Calcium 8.9 8.9 - 10.3 mg/dL   Total Protein 8.4 (H) 6.5 - 8.1 g/dL   Albumin 4.5 3.5 - 5.0 g/dL   AST 61 (H) 15 - 41 U/L   ALT 28 0 - 44 U/L   Alkaline Phosphatase 118 38 - 126 U/L   Total Bilirubin 0.5 0.3 - 1.2 mg/dL   GFR, Estimated >60 >60 mL/min   Anion gap 7 5 - 15  Ethanol  Result Value Ref Range   Alcohol, Ethyl (B) 421 (HH) <95 mg/dL  Salicylate level  Result Value Ref Range   Salicylate Lvl <2.8 (L) 7.0 - 30.0 mg/dL  Acetaminophen level  Result Value Ref Range   Acetaminophen (Tylenol), Serum <10 (L) 10 - 30 ug/mL  cbc  Result Value Ref Range   WBC 5.7 4.0 - 10.5 K/uL   RBC 4.41 4.22 - 5.81 MIL/uL   Hemoglobin 12.2 (L) 13.0 - 17.0 g/dL   HCT 37.3 (L) 39.0 - 52.0 %   MCV 84.6 80.0 - 100.0 fL   MCH 27.7 26.0 - 34.0 pg   MCHC 32.7 30.0 - 36.0 g/dL   RDW 18.8 (H) 11.5 - 15.5 %   Platelets 95 (L) 150 - 400 K/uL   nRBC 0.0 0.0 - 0.2 %  Rapid urine drug screen (hospital performed)  Result Value Ref Range   Opiates  NONE DETECTED NONE DETECTED   Cocaine NONE DETECTED NONE DETECTED   Benzodiazepines NONE DETECTED NONE DETECTED   Amphetamines NONE DETECTED NONE DETECTED   Tetrahydrocannabinol NONE DETECTED NONE DETECTED   Barbiturates NONE DETECTED NONE DETECTED    EKG EKG Interpretation  Date/Time:  Tuesday Aug 18 2021 22:00:43 EDT Ventricular Rate:  99 PR Interval:  143 QRS Duration: 92 QT Interval:  328 QTC Calculation: 421 R Axis:   90 Text Interpretation: Sinus rhythm Nonspecific ST abnormality Confirmed by Lajean Saver [phone]) on 08/18/2021 10:37:17 PM  Radiology No results found.  Procedures Procedures    Medications Ordered in ED Medications - No data to display  ED Course/ Medical Decision Making/ A&P                           Medical Decision Making Problems Addressed: Acute alcoholic intoxication without complication [facility]): acute illness or injury with systemic symptoms that poses a threat to life or bodily functions Alcohol abuse with alcohol-induced mood disorder ([place]): acute illness or injury with systemic symptoms Chronic alcoholism ([place]): chronic illness or injury with exacerbation, progression, or side effects of treatment that poses a threat to life or bodily functions Elevated blood pressure reading: acute illness or injury  Amount and/or Complexity of Data Reviewed Independent Historian: EMS    Details: hx External Data Reviewed: labs and notes. Labs: ordered. Decision-making details documented in ED Course. ECG/medicine tests: ordered and independent interpretation performed. Decision-making details documented in ED Course.  Risk Prescription drug management. Decision regarding hospitalization.   Iv ns. Continuous pulse ox and cardiac monitoring. Labs ordered/sent.  Differential diagnosis includes severe etoh intoxication, etoh associated mood disorder, electrolyte abn, etc. - disposition decision including potential need for admission if critical chem  abn, clinical deterioration, etc. - will plan to observe in ed, metabolize etoh, reassess.   Reviewed nursing notes and prior charts for additional history. External reports reviewed. Additional history from: ems.   Cardiac monitor: sinus rhythm, rate 90.   Labs reviewed/interpreted by me - glucose, k normal. Etoh very high, hx same.   Po fluids/food. Observe in ED, metabolize etoh.  Signed out to overnight EDP, Dr Matilde Sprang, to reassess in AM - anticipate probable d/c then with outpatient resources.              Final Clinical Impression(s) / ED Diagnoses Final diagnoses:  None    Rx / DC Orders ED Discharge Orders     None         Lajean Saver, MD 08/18/21 2302

## 2021-08-18 NOTE — ED Notes (Signed)
Pt wanded by security. 

## 2021-08-19 DIAGNOSIS — R45851 Suicidal ideations: Secondary | ICD-10-CM | POA: Diagnosis present

## 2021-08-19 DIAGNOSIS — Z91018 Allergy to other foods: Secondary | ICD-10-CM | POA: Diagnosis not present

## 2021-08-19 DIAGNOSIS — E440 Moderate protein-calorie malnutrition: Secondary | ICD-10-CM | POA: Diagnosis present

## 2021-08-19 DIAGNOSIS — Z886 Allergy status to analgesic agent status: Secondary | ICD-10-CM | POA: Diagnosis not present

## 2021-08-19 DIAGNOSIS — D638 Anemia in other chronic diseases classified elsewhere: Secondary | ICD-10-CM | POA: Diagnosis present

## 2021-08-19 DIAGNOSIS — F332 Major depressive disorder, recurrent severe without psychotic features: Secondary | ICD-10-CM

## 2021-08-19 DIAGNOSIS — F10930 Alcohol use, unspecified with withdrawal, uncomplicated: Secondary | ICD-10-CM | POA: Diagnosis not present

## 2021-08-19 DIAGNOSIS — R7989 Other specified abnormal findings of blood chemistry: Secondary | ICD-10-CM | POA: Diagnosis present

## 2021-08-19 DIAGNOSIS — Z5900 Homelessness unspecified: Secondary | ICD-10-CM | POA: Diagnosis not present

## 2021-08-19 DIAGNOSIS — F1020 Alcohol dependence, uncomplicated: Secondary | ICD-10-CM | POA: Diagnosis not present

## 2021-08-19 DIAGNOSIS — F17210 Nicotine dependence, cigarettes, uncomplicated: Secondary | ICD-10-CM | POA: Diagnosis present

## 2021-08-19 DIAGNOSIS — R7401 Elevation of levels of liver transaminase levels: Secondary | ICD-10-CM

## 2021-08-19 DIAGNOSIS — F10229 Alcohol dependence with intoxication, unspecified: Secondary | ICD-10-CM | POA: Diagnosis not present

## 2021-08-19 DIAGNOSIS — Z6823 Body mass index (BMI) 23.0-23.9, adult: Secondary | ICD-10-CM | POA: Diagnosis not present

## 2021-08-19 DIAGNOSIS — I10 Essential (primary) hypertension: Secondary | ICD-10-CM | POA: Diagnosis present

## 2021-08-19 DIAGNOSIS — F10239 Alcohol dependence with withdrawal, unspecified: Secondary | ICD-10-CM | POA: Diagnosis present

## 2021-08-19 DIAGNOSIS — Z79899 Other long term (current) drug therapy: Secondary | ICD-10-CM | POA: Diagnosis not present

## 2021-08-19 DIAGNOSIS — Z882 Allergy status to sulfonamides status: Secondary | ICD-10-CM | POA: Diagnosis not present

## 2021-08-19 DIAGNOSIS — Y908 Blood alcohol level of 240 mg/100 ml or more: Secondary | ICD-10-CM | POA: Diagnosis present

## 2021-08-19 DIAGNOSIS — D696 Thrombocytopenia, unspecified: Secondary | ICD-10-CM | POA: Diagnosis present

## 2021-08-19 MED ORDER — SENNOSIDES-DOCUSATE SODIUM 8.6-50 MG PO TABS
1.0000 | ORAL_TABLET | Freq: Two times a day (BID) | ORAL | Status: DC
  Administered 2021-08-19 – 2021-08-21 (×3): 1 via ORAL
  Filled 2021-08-19 (×5): qty 1

## 2021-08-19 MED ORDER — POLYETHYLENE GLYCOL 3350 17 G PO PACK
17.0000 g | PACK | Freq: Every day | ORAL | Status: DC | PRN
  Administered 2021-08-19: 17 g via ORAL
  Filled 2021-08-19: qty 1

## 2021-08-19 MED ORDER — LACTATED RINGERS IV SOLN
INTRAVENOUS | Status: DC
Start: 2021-08-19 — End: 2021-08-21

## 2021-08-19 MED ORDER — THIAMINE HCL 100 MG/ML IJ SOLN: 100.0000 mg | Freq: Every day | INTRAMUSCULAR | Status: DC

## 2021-08-19 MED ORDER — ENOXAPARIN SODIUM 40 MG/0.4ML IJ SOSY: 40.0000 mg | PREFILLED_SYRINGE | INTRAMUSCULAR | Status: DC

## 2021-08-19 MED ORDER — ONDANSETRON HCL 4 MG PO TABS: 4.0000 mg | ORAL_TABLET | Freq: Four times a day (QID) | ORAL | Status: DC | PRN

## 2021-08-19 MED ORDER — LORAZEPAM 2 MG/ML IJ SOLN
1.0000 mg | INTRAMUSCULAR | Status: AC | PRN
  Administered 2021-08-20: 1 mg via INTRAVENOUS
  Administered 2021-08-21: 2 mg via INTRAVENOUS
  Administered 2021-08-21: 1 mg via INTRAVENOUS
  Filled 2021-08-19 (×3): qty 1

## 2021-08-19 MED ORDER — FOLIC ACID 1 MG PO TABS
1.0000 mg | ORAL_TABLET | Freq: Every day | ORAL | Status: DC
  Administered 2021-08-19 – 2021-08-22 (×4): 1 mg via ORAL
  Filled 2021-08-19 (×4): qty 1

## 2021-08-19 MED ORDER — BISACODYL 10 MG RE SUPP: 10.0000 mg | Freq: Every day | RECTAL | Status: DC | PRN

## 2021-08-19 MED ORDER — LORAZEPAM 1 MG PO TABS
1.0000 mg | ORAL_TABLET | ORAL | Status: AC | PRN
  Administered 2021-08-19: 1 mg via ORAL
  Administered 2021-08-19: 2 mg via ORAL
  Administered 2021-08-20: 1 mg via ORAL
  Administered 2021-08-21: 2 mg via ORAL
  Filled 2021-08-19: qty 1
  Filled 2021-08-19: qty 2
  Filled 2021-08-19: qty 3

## 2021-08-19 MED ORDER — THIAMINE HCL 100 MG PO TABS
100.0000 mg | ORAL_TABLET | Freq: Every day | ORAL | Status: DC
  Administered 2021-08-19 – 2021-08-22 (×4): 100 mg via ORAL
  Filled 2021-08-19 (×4): qty 1

## 2021-08-19 MED ORDER — ENSURE ENLIVE PO LIQD
237.0000 mL | Freq: Two times a day (BID) | ORAL | Status: DC
  Administered 2021-08-20 (×2): 237 mL via ORAL

## 2021-08-19 MED ORDER — ONDANSETRON HCL 4 MG/2ML IJ SOLN: 4.0000 mg | Freq: Four times a day (QID) | INTRAMUSCULAR | Status: DC | PRN

## 2021-08-19 MED ORDER — ACETAMINOPHEN 650 MG RE SUPP: 650.0000 mg | Freq: Four times a day (QID) | RECTAL | Status: DC | PRN

## 2021-08-19 MED ORDER — LORAZEPAM 1 MG PO TABS
0.0000 mg | ORAL_TABLET | Freq: Two times a day (BID) | ORAL | Status: DC
  Administered 2021-08-21: 2 mg via ORAL
  Filled 2021-08-19 (×2): qty 2

## 2021-08-19 MED ORDER — ADULT MULTIVITAMIN W/MINERALS CH
1.0000 | ORAL_TABLET | Freq: Every day | ORAL | Status: DC
  Administered 2021-08-19 – 2021-08-22 (×4): 1 via ORAL
  Filled 2021-08-19 (×4): qty 1

## 2021-08-19 MED ORDER — LORAZEPAM 1 MG PO TABS
0.0000 mg | ORAL_TABLET | Freq: Four times a day (QID) | ORAL | Status: AC
  Administered 2021-08-19: 3 mg via ORAL
  Administered 2021-08-20 (×4): 1 mg via ORAL
  Filled 2021-08-19 (×5): qty 1

## 2021-08-19 MED ORDER — ACETAMINOPHEN 325 MG PO TABS
650.0000 mg | ORAL_TABLET | Freq: Four times a day (QID) | ORAL | Status: DC | PRN
  Administered 2021-08-19: 650 mg via ORAL
  Filled 2021-08-19: qty 2

## 2021-08-19 NOTE — Progress Notes (Signed)
Patient arrived in room 1433 with suicide sitter at bedside.  Patient c/o moderate headache, mild anxiety, some tactile disturbances.  BP 172/78.  2 mg Ativan PO given per protocol.  Cardiac monitoring established.  Suicide precaution checklist evaluated.  Patient and sitter reviewed suicide precautions.  Angie Fava, RN

## 2021-08-19 NOTE — H&P (Signed)
History and Physical    Barry Moore INO:[id number] DOB: [birth year] DOA: 08/18/2021  PCP: Pcp, No   Patient coming from: Home  I have personally briefly reviewed patient's old medical records in [place]  Chief Complaint: Alcohol problem and depression  HPI: Barry Moore is a 56 y.o. male with medical history significant of alcohol abuse, possible alcohol withdrawal seizures, depression, alcoholic hepatitis, pancreatitis, hypertension, homelessness, thrombocytopenia, GI bleeding presented with worsening anxiety and depression due to homelessness and ongoing alcohol use.  Patient is a poor historian.  Patient complains suicidal ideation and auditory hallucinations and apparently stated to ED provider that he "plans to drink himself to death".  There is no history of witnessed seizures.  No fever, vomiting, chest pain, loss of consciousness reported.  Reliability of the history is poor.  ED Course: Alcohol level was 421.  He was started on CIWA protocol patient was evaluated by psychiatry and initially the plan was for the patient to be admitted to [facility] because of history of alcohol withdrawal seizures, it was recommended that patient be admitted to medical service first. Hospitalist service was called to evaluate the patient.  Review of Systems: Could not be appropriately obtained because of patient being a poor historian and having received Ativan  Past Medical History:  Diagnosis Date   Alcoholic hepatitis    Depression    ETOH abuse    Gastric bleed 08/2018   Hypertension    Pancreatitis    Seizures ([place])    Suicidal behavior     Past Surgical History:  Procedure Laterality Date   BIOPSY  08/05/2018   Procedure: BIOPSY;  Surgeon: Irving Copas., MD;  Location: [facility] ENDOSCOPY;  Service: Gastroenterology;;   BIOPSY  08/07/2018   Procedure: BIOPSY;  Surgeon: Thornton Park, MD;  Location: [facility] ENDOSCOPY;  Service: Gastroenterology;;   BIOPSY  01/02/2019    Procedure: BIOPSY;  Surgeon: Gatha Mayer, MD;  Location: [facility] ENDOSCOPY;  Service: Endoscopy;;   COLONOSCOPY WITH PROPOFOL N/A 08/07/2018   Procedure: COLONOSCOPY WITH PROPOFOL;  Surgeon: Thornton Park, MD;  Location: [facility] [place];  Service: Gastroenterology;  Laterality: N/A;   ESOPHAGOGASTRODUODENOSCOPY N/A 01/02/2019   Procedure: ESOPHAGOGASTRODUODENOSCOPY (EGD);  Surgeon: Gatha Mayer, MD;  Location: [facility] ENDOSCOPY;  Service: Endoscopy;  Laterality: N/A;   ESOPHAGOGASTRODUODENOSCOPY N/A 07/03/2019   Procedure: ESOPHAGOGASTRODUODENOSCOPY (EGD);  Surgeon: Wonda Horner, MD;  Location: [facility] ENDOSCOPY;  Service: Endoscopy;  Laterality: N/A;   ESOPHAGOGASTRODUODENOSCOPY N/A 06/26/2020   Procedure: ESOPHAGOGASTRODUODENOSCOPY (EGD);  Surgeon: Arta Silence, MD;  Location: [facility] ENDOSCOPY;  Service: Endoscopy;  Laterality: N/A;   ESOPHAGOGASTRODUODENOSCOPY (EGD) WITH PROPOFOL N/A 11/02/2016   Procedure: ESOPHAGOGASTRODUODENOSCOPY (EGD) WITH PROPOFOL;  Surgeon: Carol Ada, MD;  Location: [facility] ENDOSCOPY;  Service: Endoscopy;  Laterality: N/A;   ESOPHAGOGASTRODUODENOSCOPY (EGD) WITH PROPOFOL N/A 08/05/2018   Procedure: ESOPHAGOGASTRODUODENOSCOPY (EGD) WITH PROPOFOL;  Surgeon: Rush Landmark Telford Nab., MD;  Location: [facility];  Service: Gastroenterology;  Laterality: N/A;   ESOPHAGOGASTRODUODENOSCOPY (EGD) WITH PROPOFOL N/A 07/14/2019   Procedure: ESOPHAGOGASTRODUODENOSCOPY (EGD) WITH PROPOFOL;  Surgeon: Otis Brace, MD;  Location: [facility] ENDOSCOPY;  Service: Gastroenterology;  Laterality: N/A;   HERNIA REPAIR     LEG SURGERY     POLYPECTOMY  08/07/2018   Procedure: POLYPECTOMY;  Surgeon: Thornton Park, MD;  Location: [place];  Service: Gastroenterology;;     reports that he has been smoking cigarettes. He has been smoking an average of 1 pack per day. He has never used smokeless tobacco. He reports current alcohol use. He  reports that he does not currently use drugs after having used the  following drugs: Cocaine. Frequency: 3.00 times per week.  Allergies  Allergen Reactions   Tomato Shortness Of Breath and Nausea And Vomiting   Aspirin Nausea And Vomiting   Sulfa Antibiotics Other (See Comments)    "Does not make me feel good"    Family History  Problem Relation Age of Onset   Diabetes Mother    Alcoholism Mother    Emphysema Father    Lung cancer Father    Alcoholism Father     Prior to Admission medications   Medication Sig Start Date End Date Taking? Authorizing Provider  propranolol (INDERAL) 10 MG tablet Take 10 mg by mouth 2 (two) times daily.   Yes [provider]  topiramate (TOPAMAX) 25 MG tablet Take 25 mg by mouth 2 (two) times daily.   Yes [provider]  venlafaxine (EFFEXOR) 75 MG tablet Take 75 mg by mouth 2 (two) times daily. 05/15/21  Yes [provider]  gabapentin (NEURONTIN) 100 MG capsule Take 1 capsule (100 mg total) by mouth 3 (three) times daily. Patient not taking: Reported on 08/07/2021 07/22/21 08/21/21  Janine Limbo, MD  sertraline (ZOLOFT) 50 MG tablet Take 1 tablet (50 mg total) by mouth daily. Patient not taking: Reported on 08/07/2021 07/23/21 08/22/21  Janine Limbo, MD  traZODone (DESYREL) 100 MG tablet Take 1 tablet (100 mg total) by mouth at bedtime. Patient not taking: Reported on 08/07/2021 07/22/21 08/21/21  Janine Limbo, MD  amLODipine (NORVASC) 5 MG tablet Take 1 tablet (5 mg total) by mouth daily. Patient not taking: Reported on 08/14/2020 06/27/20 08/14/20  Charlynne Cousins, MD  ferrous sulfate 325 (65 FE) MG tablet Take 1 tablet (325 mg total) by mouth daily with breakfast. Patient not taking: Reported on 09/23/2018 08/09/18 10/09/18  Elodia Florence., MD  pantoprazole (PROTONIX) 40 MG tablet Take 1 tablet (40 mg total) by mouth 2 (two) times daily before a meal. Patient not taking: Reported on 08/14/2020 06/27/20 08/14/20  Charlynne Cousins, MD    Physical Exam: Vitals:   08/19/21 1205  08/19/21 1328 08/19/21 1549 08/19/21 1553  BP: (!) 164/84 (!) 165/80 (!) 178/87 (!) 178/87  Pulse: 90 92 82 82  Resp:  18 20   Temp:  99.4 F (37.4 C)    TempSrc:  Oral    SpO2:  95% 99%     Constitutional: NAD, calm, comfortable.  Looks chronically ill and deconditioned and very disheveled.  Currently on room air. Vitals:   08/19/21 1205 08/19/21 1328 08/19/21 1549 08/19/21 1553  BP: (!) 164/84 (!) 165/80 (!) 178/87 (!) 178/87  Pulse: 90 92 82 82  Resp:  18 20   Temp:  99.4 F (37.4 C)    TempSrc:  Oral    SpO2:  95% 99%    Eyes: PERRL, lids and conjunctivae normal ENMT: Mucous membranes are moist. Posterior pharynx clear of any exudate or lesions. Neck: normal, supple, no masses, no thyromegaly Respiratory: bilateral decreased breath sounds at bases, no wheezing, no crackles. Normal respiratory effort. No accessory muscle use.  Cardiovascular: S1 S2 positive, rate controlled. No extremity edema. 2+ pedal pulses.  Abdomen: no tenderness, no masses palpated. No hepatosplenomegaly. Bowel sounds positive.  Musculoskeletal: no clubbing / cyanosis. No joint deformity upper and lower extremities.  Skin: no rashes, lesions, ulcers. No induration Neurologic: CN 2-12 grossly intact. Moving extremities. No focal neurologic deficits. Slow to respond and a poor  historian. Psychiatric: No signs of agitation currently.  Does not participate in conversation much.  Affect is mostly flat.:  Labs on Admission: I have personally reviewed following labs and imaging studies  CBC: Recent Labs  Lab 08/18/21 2200  WBC 5.7  HGB 12.2*  HCT 37.3*  MCV 84.6  PLT 95*   Basic Metabolic Panel: Recent Labs  Lab 08/18/21 2200  NA 140  K 3.7  CL 105  CO2 28  GLUCOSE 104*  BUN 9  CREATININE 0.87  CALCIUM 8.9   GFR: Estimated Creatinine Clearance: 91.7 mL/min (by C-G formula based on SCr of 0.87 mg/dL). Liver Function Tests: Recent Labs  Lab 08/18/21 2200  AST 61*  ALT 28  ALKPHOS 118   BILITOT 0.5  PROT 8.4*  ALBUMIN 4.5   No results for input(s): LIPASE, AMYLASE in the last 168 hours. No results for input(s): AMMONIA in the last 168 hours. Coagulation Profile: No results for input(s): INR, PROTIME in the last 168 hours. Cardiac Enzymes: No results for input(s): CKTOTAL, CKMB, CKMBINDEX, TROPONINI in the last 168 hours. BNP (last 3 results) No results for input(s): PROBNP in the last 8760 hours. HbA1C: No results for input(s): HGBA1C in the last 72 hours. CBG: No results for input(s): GLUCAP in the last 168 hours. Lipid Profile: No results for input(s): CHOL, HDL, LDLCALC, TRIG, CHOLHDL, LDLDIRECT in the last 72 hours. Thyroid Function Tests: No results for input(s): TSH, T4TOTAL, FREET4, T3FREE, THYROIDAB in the last 72 hours. Anemia Panel: No results for input(s): VITAMINB12, FOLATE, FERRITIN, TIBC, IRON, RETICCTPCT in the last 72 hours. Urine analysis:    Component Value Date/Time   COLORURINE YELLOW 07/15/2021 2013   APPEARANCEUR CLEAR 07/15/2021 2013   LABSPEC 1.011 07/15/2021 2013   PHURINE 6.0 07/15/2021 2013   GLUCOSEU NEGATIVE 07/15/2021 2013   HGBUR NEGATIVE 07/15/2021 2013   [place] NEGATIVE 07/15/2021 2013   [place] NEGATIVE 07/15/2021 2013   PROTEINUR NEGATIVE 07/15/2021 2013   NITRITE NEGATIVE 07/15/2021 2013   LEUKOCYTESUR NEGATIVE 07/15/2021 2013    Radiological Exams on Admission: No results found.   Assessment/Plan  Alcohol abuse with intoxication Concern for alcohol withdrawal History of alcohol withdrawal seizure -Patient presented with alcohol level of 421.  There has been no evidence of any witnessed seizures but because of alcohol withdrawal seizure history, psychiatry recommended that patient be treated for alcohol withdrawal under medical service. -Continue CIWA protocol along with thiamine, multivitamin and folic acid. -TOC consult -Seizure precautions  Suicidal ideation Severe depression -Patient will need  admission to inpatient psychiatric facility once medically improved.  Consult psychiatry.  Psychiatric medications as per psychiatry recommendations. -Continue sitter at bedside  Elevated LFTs -Possibly from alcohol abuse.  Monitor  Thrombocytopenia -Chronic.  From alcohol abuse.  No signs of bleeding.  Monitor  Hypertension -Blood pressure on the high side.  Possibly from withdrawal.  Does not take any antihypertensives at home.  Monitor.  Start amlodipine.  Homelessness -TOC consult  Normocytic anemia -From chronic illnesses and alcohol abuse.  Hemoglobin stable.  Monitor intermittently  DVT prophylaxis: SCDs.  Avoid Lovenox because of thrombocytopenia Code Status: Full Family Communication: None at bedside Disposition Plan: Inpatient psychiatric facility once medically cleared Consults called: Psychiatry Admission status: Telemetry/inpatient  Severity of Illness: The appropriate patient status for this patient is INPATIENT. Inpatient status is judged to be reasonable and necessary in order to provide the required intensity of service to ensure the patient's safety. The patient's presenting symptoms, physical exam findings, and initial radiographic  and laboratory data in the context of their chronic comorbidities is felt to place them at high risk for further clinical deterioration. Furthermore, it is not anticipated that the patient will be medically stable for discharge from the hospital within 2 midnights of admission.   * I certify that at the point of admission it is my clinical judgment that the patient will require inpatient hospital care spanning beyond 2 midnights from the point of admission due to high intensity of service, high risk for further deterioration and high frequency of surveillance required.Aline August MD [facility]  08/19/2021, 5:01 PM

## 2021-08-19 NOTE — ED Notes (Signed)
Family updated as to patient's status.

## 2021-08-19 NOTE — Consult Note (Signed)
[facility] ED ASSESSMENT   Reason for Consult:  Psychiatry evaluation Referring Physician:  ER Physician Patient Identification: Barry Moore MRN:  [id number] ED Chief Complaint: <principal problem not specified>  Diagnosis:  Active Problems:   Alcohol use disorder, severe, dependence [facility])   ED Assessment Time Calculation: Start Time: 1215 Stop Time: 1249 Total Time in Minutes (Assessment Completion): 34   Subjective:   Barry Moore  is a 56 y.o. male patient admitted with Alcohol intoxication with suicide ideation brought in by EMS with report of Seizure activity earlier during the day  Alcohol level on arrival to ER was 421.[name]  HPI:  56 y.o.Caucasian  male patient admitted with Alcohol intoxication with suicide ideation brought in by EMS with report of Seizure activity earlier during the day  Alcohol level on arrival to ER was 421.  Patient was seen in his ER room shaking, tremulous and stated that he does not care about life anymore, tired of drinking Alcohol and will continue to use Alcohol to kill himself.  Patient travels to and back from [place].  He was diagnosed with Depression long time ago and does not take any Psychotropic medication and does not see a Psychiatrist.  He has two son's in [place] who do not have anything to do with him.  Patient drinks to fall asleep he says.  He feels hopeless and helpless.  He drinks 4-5 40 oz beer daily. Patient is placed on CIWA Protocol using Ativan.  We will seek detox treatment while we start treatment in the ER setting.  He does not meet criteria for [place] due to active suicide endorsement.  Patient will be faxed out for treatment if no available bed in [facility].  UDS is negative, slight elevation of AST. Past Psychiatric History: Alcohol Dependence/Abuse, Recurrent Major Depressive disorder,   Risk to Self or Others: Is the patient at risk to self? Yes Has the patient been a risk to self in the past 6 months? Yes Has the patient been a risk to self within  the distant past? Yes Is the patient a risk to others? No Has the patient been a risk to others in the past 6 months? No Has the patient been a risk to others within the distant past? No  [place] Scale:  [facility] ED from 08/18/2021 in [place] DEPT ED from 08/07/2021 in [place] DEPT Admission (Discharged) from 07/16/2021 in [place] [facility]  C-SSRS RISK CATEGORY High Risk Low Risk High Risk       AIMS:  , , ,  ,   ASAM:    Substance Abuse:     Past Medical History:  Past Medical History:  Diagnosis Date   Alcoholic hepatitis    Depression    ETOH abuse    Gastric bleed 08/2018   Hypertension    Pancreatitis    Seizures ([place])    Suicidal behavior     Past Surgical History:  Procedure Laterality Date   BIOPSY  08/05/2018   Procedure: BIOPSY;  Surgeon: Irving Copas., MD;  Location: [facility] ENDOSCOPY;  Service: Gastroenterology;;   BIOPSY  08/07/2018   Procedure: BIOPSY;  Surgeon: Thornton Park, MD;  Location: [facility] ENDOSCOPY;  Service: Gastroenterology;;   BIOPSY  01/02/2019   Procedure: BIOPSY;  Surgeon: Gatha Mayer, MD;  Location: [facility] ENDOSCOPY;  Service: Endoscopy;;   COLONOSCOPY WITH PROPOFOL N/A 08/07/2018   Procedure: COLONOSCOPY WITH PROPOFOL;  Surgeon: Thornton Park, MD;  Location: [place];  Service: Gastroenterology;  Laterality: N/A;   ESOPHAGOGASTRODUODENOSCOPY N/A 01/02/2019   Procedure: ESOPHAGOGASTRODUODENOSCOPY (EGD);  Surgeon: Gatha Mayer, MD;  [facility];  Service: Endoscopy;  Laterality: N/A;   ESOPHAGOGASTRODUODENOSCOPY N/A 07/03/2019   Procedure: ESOPHAGOGASTRODUODENOSCOPY (EGD);  Surgeon: Wonda Horner, MD;  Location: [facility] END[facility];  Service: Endoscopy;  Laterality: N/A;   ESOPHAGOGASTRODUODENOSCOPY N/A 06/26/2020   Procedure: ESOPHAGOGASTRODUODENOSCOPY (EGD);  Surgeon: Arta Silence, MD;  Location: [facility] END[facility];  Service:  Endoscopy;  Laterality: N/A;   ESOPHAGOGASTRODUODENOSCOPY (EGD) WITH PROPOFOL N/A 11/02/2016   Procedure: ESOPHAGOGASTRODUODENOSCOPY (EGD) WITH PROPOFOL;  Surgeon: Carol Ada, MD;  Location: [facility] ENDOSCOPY;  Service: Endoscopy;  Laterality: N/A;   ESOPHAGOGASTRODUODENOSCOPY (EGD) WITH PROPOFOL N/A 08/05/2018   Procedure: ESOPHAGOGASTRODUODENOSCOPY (EGD) WITH PROPOFOL;  Surgeon: Rush Landmark Telford Nab., MD;  Location: [place];  Service: Gastroenterology;  Laterality: N/A;   ESOPHAGOGASTRODUODENOSCOPY (EGD) WITH PROPOFOL N/A 07/14/2019   Procedure: ESOPHAGOGASTRODUODENOSCOPY (EGD) WITH PROPOFOL;  Surgeon: Otis Brace, MD;  Location: [facility] ENDOSCOPY;  Service: Gastroenterology;  Laterality: N/A;   HERNIA REPAIR     LEG SURGERY     POLYPECTOMY  08/07/2018   Procedure: POLYPECTOMY;  Surgeon: Thornton Park, MD;  Location: [facility] ENDOSCOPY;  Service: Gastroenterology;;   Family History:  Family History  Problem Relation Age of Onset   Diabetes Mother    Alcoholism Mother    Emphysema Father    Lung cancer Father    Alcoholism Father    Family Psychiatric  History: unknown Social History:  Social History   Substance and Sexual Activity  Alcohol Use Yes   Comment: 5+ BEERS DAILY, Patient does not know how much he drank tonight     Social History   Substance and Sexual Activity  Drug Use Not Currently   Frequency: 3.0 times per week   Types: Cocaine    Social History   Socioeconomic History   Marital status: Divorced    Spouse name: Not on file   Number of children: Not on file   Years of education: Not on file   Highest education level: Not on file  Occupational History   Not on file  Tobacco Use   Smoking status: Every Day    Packs/day: 1.00    Types: Cigarettes   Smokeless tobacco: Never  Vaping Use   Vaping Use: Never used  Substance and Sexual Activity   Alcohol use: Yes    Comment: 5+ BEERS DAILY, Patient does not know how much he drank tonight   Drug use: Not  Currently    Frequency: 3.0 times per week    Types: Cocaine   Sexual activity: Not Currently  Other Topics Concern   Not on file  Social History Narrative   Not on file   Social Determinants of Health   Financial Resource Strain: Not on file  Food Insecurity: Not on file  Transportation Needs: Not on file  Physical Activity: Not on file  Stress: Not on file  Social Connections: Not on file   Additional Social History:    Allergies:   Allergies  Allergen Reactions   Tomato Shortness Of Breath and Nausea And Vomiting   Aspirin Nausea And Vomiting   Sulfa Antibiotics Other (See Comments)    "Does not make me feel good"    Labs:  Results for orders placed or performed during the hospital encounter of 08/18/21 (from the past 48 hour(s))  Comprehensive metabolic panel     Status: Abnormal   Collection Time: 08/18/21 10:00 PM  Result Value  Ref Range   Sodium 140 135 - 145 mmol/L   Potassium 3.7 3.5 - 5.1 mmol/L   Chloride 105 98 - 111 mmol/L   CO2 28 22 - 32 mmol/L   Glucose, Bld 104 (H) 70 - 99 mg/dL    Comment: Glucose reference range applies only to samples taken after fasting for at least 8 hours.   BUN 9 6 - 20 mg/dL   Creatinine, Ser 0.87 0.61 - 1.24 mg/dL   Calcium 8.9 8.9 - 10.3 mg/dL   Total Protein 8.4 (H) 6.5 - 8.1 g/dL   Albumin 4.5 3.5 - 5.0 g/dL   AST 61 (H) 15 - 41 U/L   ALT 28 0 - 44 U/L   Alkaline Phosphatase 118 38 - 126 U/L   Total Bilirubin 0.5 0.3 - 1.2 mg/dL   GFR, Estimated >60 >60 mL/min    Comment: (NOTE) Calculated using the CKD-EPI Creatinine Equation (2021)    Anion gap 7 5 - 15    Comment: Performed at [facility], [place] [address] [place], [place] [address]  Ethanol     Status: Abnormal   Collection Time: 08/18/21 10:00 PM  Result Value Ref Range   Alcohol, Ethyl (B) 421 (HH) <10 mg/dL    Comment: CRITICAL RESULT CALLED TO, READ BACK BY AND VERIFIED WITH:  KATY GESELL RN 08/18/21 @ 2236 VS (NOTE) Lowest  detectable limit for serum alcohol is 10 mg/dL.  For medical purposes only. Performed at [facility], [place] [address] [place], [place] [address]   Salicylate level     Status: Abnormal   Collection Time: 08/18/21 10:00 PM  Result Value Ref Range   Salicylate Lvl <7.4 (L) 7.0 - 30.0 mg/dL    Comment: Performed at [facility], [place] [address], [place], [place] [address]  Acetaminophen level     Status: Abnormal   Collection Time: 08/18/21 10:00 PM  Result Value Ref Range   Acetaminophen (Tylenol), Serum <10 (L) 10 - 30 ug/mL    Comment: (NOTE) Therapeutic concentrations vary significantly. A range of 10-30 ug/mL  may be an effective concentration for many patients. However, some  are best treated at concentrations outside of this range. Acetaminophen concentrations >150 ug/mL at 4 hours after ingestion  and >50 ug/mL at 12 hours after ingestion are often associated with  toxic reactions.  Performed at [facility], [place] [address], [place], [place] [address]   cbc     Status: Abnormal   Collection Time: 08/18/21 10:00 PM  Result Value Ref Range   WBC 5.7 4.0 - 10.5 K/uL   RBC 4.41 4.22 - 5.81 MIL/uL   Hemoglobin 12.2 (L) 13.0 - 17.0 g/dL   HCT 37.3 (L) 39.0 - 52.0 %   MCV 84.6 80.0 - 100.0 fL   MCH 27.7 26.0 - 34.0 pg   MCHC 32.7 30.0 - 36.0 g/dL   RDW 18.8 (H) 11.5 - 15.5 %   Platelets 95 (L) 150 - 400 K/uL    Comment: SPECIMEN CHECKED FOR CLOTS Immature Platelet Fraction may be clinically indicated, consider ordering this additional test [id number] REPEATED TO VERIFY PLATELET COUNT CONFIRMED BY SMEAR    nRBC 0.0 0.0 - 0.2 %    Comment: Performed at [facility], [place] [address], [place], [place] [address]  Rapid urine drug screen (hospital performed)     Status: None   Collection Time: 08/18/21 10:00 PM  Result Value Ref Range  Opiates NONE DETECTED NONE DETECTED   Cocaine NONE DETECTED NONE  DETECTED   Benzodiazepines NONE DETECTED NONE DETECTED   Amphetamines NONE DETECTED NONE DETECTED   Tetrahydrocannabinol NONE DETECTED NONE DETECTED   Barbiturates NONE DETECTED NONE DETECTED    Comment: (NOTE) DRUG SCREEN FOR MEDICAL PURPOSES ONLY.  IF CONFIRMATION IS NEEDED FOR ANY PURPOSE, NOTIFY LAB WITHIN 5 DAYS.  LOWEST DETECTABLE LIMITS FOR URINE DRUG SCREEN Drug Class                     Cutoff (ng/mL) Amphetamine and metabolites    1000 Barbiturate and metabolites    200 Benzodiazepine                 097 Tricyclics and metabolites     300 Opiates and metabolites        300 Cocaine and metabolites        300 THC                            50 Performed at [facility], [place] [address], [place], [place] [address]     Current Facility-Administered Medications  Medication Dose Route Frequency Provider Last Rate Last Admin   folic acid (FOLVITE) tablet 1 mg  1 mg Oral Daily Katriona Schmierer C, NP   1 mg at 08/19/21 1213   LORazepam (ATIVAN) tablet 1-4 mg  1-4 mg Oral Q1H PRN Charmaine Downs C, NP       Or   LORazepam (ATIVAN) injection 1-4 mg  1-4 mg Intravenous Q1H PRN Charmaine Downs C, NP       LORazepam (ATIVAN) tablet 0-4 mg  0-4 mg Oral Q6H Liel Rudden C, NP   3 mg at 08/19/21 1212   Followed by   Derrill Memo ON 08/21/2021] LORazepam (ATIVAN) tablet 0-4 mg  0-4 mg Oral Q12H Nadie Fiumara C, NP       multivitamin with minerals tablet 1 tablet  1 tablet Oral Daily Charmaine Downs C, NP   1 tablet at 08/19/21 1213   thiamine tablet 100 mg  100 mg Oral Daily Charmaine Downs C, NP   100 mg at 08/19/21 1214   Or   thiamine (B-1) injection 100 mg  100 mg Intravenous Daily Charmaine Downs C, NP       Current Outpatient Medications  Medication Sig Dispense Refill   propranolol (INDERAL) 10 MG tablet Take 10 mg by mouth 2 (two) times daily.     topiramate (TOPAMAX) 25 MG tablet Take 25 mg by mouth 2 (two) times daily.     venlafaxine  (EFFEXOR) 75 MG tablet Take 75 mg by mouth 2 (two) times daily.     gabapentin (NEURONTIN) 100 MG capsule Take 1 capsule (100 mg total) by mouth 3 (three) times daily. (Patient not taking: Reported on 08/07/2021) 90 capsule 0   sertraline (ZOLOFT) 50 MG tablet Take 1 tablet (50 mg total) by mouth daily. (Patient not taking: Reported on 08/07/2021) 30 tablet 0   traZODone (DESYREL) 100 MG tablet Take 1 tablet (100 mg total) by mouth at bedtime. (Patient not taking: Reported on 08/07/2021) 30 tablet 0    Musculoskeletal: Strength & Muscle Tone:  in bed resting Gait & Station:  in bed resting Patient leans:  see above.   Psychiatric Specialty Exam: Presentation  General Appearance: Disheveled; Casual  Eye Contact:Minimal  Speech:Clear and Coherent; Normal Rate  Speech Volume:Normal  Handedness:Right  Mood and Affect  Mood:Anxious; Depressed  Affect:Congruent; Depressed; Tearful   Thought Process  Thought Processes:Goal Directed; Coherent; Linear  Descriptions of Associations:Intact  Orientation:Full (Time, Place and Person)  Thought Content:Logical  History of Schizophrenia/Schizoaffective disorder:No  Duration of Psychotic Symptoms:Less than six months  Hallucinations:Hallucinations: None  Ideas of Reference:None  Suicidal Thoughts:Suicidal Thoughts: Yes, Active SI Active Intent and/or Plan: With Intent; With Plan; With Means to [place]; With Access to Means  Homicidal Thoughts:Homicidal Thoughts: No   Sensorium  Memory:Immediate Good; Recent Good; Remote Good  Judgment:Fair  Insight:Good   Executive Functions  Concentration:Fair  Attention Span:Fair  [place]  Language:Good   Psychomotor Activity  Psychomotor Activity:Psychomotor Activity: Tremor; Other (comment) (tremelous, shaking)   Assets  Assets:Communication Skills; Desire for Improvement    Sleep  Sleep:Sleep: Poor   Physical Exam: Physical  Exam Vitals (Disheveled) and nursing note reviewed.  Constitutional:      Appearance: He is obese.  HENT:     Head: Normocephalic and atraumatic.     Nose: Nose normal.  Cardiovascular:     Rate and Rhythm: Normal rate and regular rhythm.  Pulmonary:     Effort: Pulmonary effort is normal.  Musculoskeletal:        General: Normal range of motion.     Cervical back: Normal range of motion.  Skin:    General: Skin is warm and dry.  Neurological:     General: No focal deficit present.     Mental Status: He is alert and oriented to person, place, and time.   Review of Systems  Constitutional:  Positive for malaise/fatigue.  HENT: Negative.    Eyes: Negative.   Respiratory: Negative.    Cardiovascular: Negative.   Gastrointestinal: Negative.   Genitourinary: Negative.   Musculoskeletal: Negative.   Skin: Negative.   Neurological:  Positive for seizures.       Alcohol withdrawal seizures  Endo/Heme/Allergies: Negative.   Psychiatric/Behavioral:  Positive for depression and suicidal ideas. The patient is nervous/anxious.   Blood pressure (!) 164/84, pulse 90, temperature 98.3 F (36.8 C), temperature source Oral, resp. rate 16, SpO2 98 %. There is no height or weight on file to calculate BMI.  Medical Decision Making:Patient meets criteria for inpatient psychiatric hospitalization for Alcohol detox.  Patient reported Seizure episode yesterday before arrival to ER.  He is suicidal and plan is to drink himself to death.  CIWA protocol using Ativan, Seek bed placement.  Problem 1: Alcohol use disorder/Dependence  Problem 2: Major Depressive disorder,recurrent.   Disposition:  Admit, seek bed placement.  Delfin Gant, NP-PMHNP-BC 08/19/2021 12:52 PM

## 2021-08-19 NOTE — ED Notes (Signed)
Pt eating lunch

## 2021-08-19 NOTE — Progress Notes (Signed)
Inpatient Behavioral Health Placement  Pt meets inpatient criteria per Charmaine Downs, NP. There are no available beds at [facility] per [facility] Wynonia Hazard, RN. Referral was sent to the following facilities;   Destination Service Provider Address Phone Fax  [facility]  [address] [place] [address] [phone] [facility]  [place], [place] [address] [place]  [facility] [address] [place] [address] [phone]  [facility]  [address] [place] [address] [phone] [place] [address] [place] [address] [phone]  [facility]  [place] [address] [phone] [place]  [address] [place] [address] [phone] [facility]  [address], [place] [address] [phone] [place]  [address] [place] [address] [phone] [facility]  [address] [place] [address] [phone] [facility]  [place], [place] [address] [phone]  [facility]  [address] [place] [address] [phone]  [facility]  [address], [place] [address] [phone]  [facility]  [address] [place]  [address] [phone]    Situation ongoing,  CSW will follow up.   Benjaman Kindler, MSW, [facility] 08/19/2021  @ 10:04 PM

## 2021-08-19 NOTE — ED Provider Notes (Signed)
  Physical Exam  BP (!) 178/87   Pulse 82   Temp 99.4 F (37.4 C) (Oral)   Resp 20   SpO2 99%   Physical Exam  Procedures  Procedures  ED Course / MDM   Clinical Course as of 08/19/21 1639  Tue Aug 18, 2021  2307 MTF and likely dc [MK]    Clinical Course User Index [MK] Kommor, Debe Coder, MD   Medical Decision Making Patient care was assumed at 3 PM.  Patient came in yesterday for alcohol intoxication.  Patient also has some suicidal ideation.  Psychiatry initially recommended transfer to [facility].  However they reviewed his records and he had alcohol withdrawal seizure previously.  They felt that he would benefit from medical admission.  His initial CIWA was 16 around noon. Then he was given 3 mg of ativan. His repeat CIWA was 5 around 4 pm and was given another 1 mg of ativan. Hospitalist to admit.   Problems Addressed: Acute alcoholic intoxication without complication ([place]): acute illness or injury Alcohol abuse with alcohol-induced mood disorder [facility]): acute illness or injury  Amount and/or Complexity of Data Reviewed Labs: ordered. Decision-making details documented in ED Course.  Risk Decision regarding hospitalization.          Drenda Freeze, MD 08/19/21 [phone]

## 2021-08-19 NOTE — ED Provider Notes (Signed)
  Physical Exam  BP 139/81   Pulse 87   Temp 98.3 F (36.8 C) (Oral)   Resp 16   SpO2 98%   Physical Exam Constitutional:      General: He is not in acute distress.    Appearance: Normal appearance.  HENT:     Head: Normocephalic and atraumatic.     Nose: No congestion or rhinorrhea.  Eyes:     General:        Right eye: No discharge.        Left eye: No discharge.     Extraocular Movements: Extraocular movements intact.     Pupils: Pupils are equal, round, and reactive to light.  Cardiovascular:     Rate and Rhythm: Normal rate and regular rhythm.     Heart sounds: No murmur heard. Pulmonary:     Effort: No respiratory distress.     Breath sounds: No wheezing or rales.  Abdominal:     General: There is no distension.     Tenderness: There is no abdominal tenderness.  Musculoskeletal:        General: Normal range of motion.     Cervical back: Normal range of motion.  Skin:    General: Skin is warm and dry.  Neurological:     General: No focal deficit present.     Mental Status: He is alert.    Procedures  Procedures  ED Course / MDM   Clinical Course as of 08/19/21 0555  Tue Aug 18, 2021  2307 MTF and likely dc [MK]    Clinical Course User Index [MK] Alida Greiner, Debe Coder, MD   Medical Decision Making Amount and/or Complexity of Data Reviewed Labs: ordered.   Patient received in handoff.  Arrives with severe alcohol intoxication and expressing suicidal ideation.  Initial plan is for patient to metabolize the alcohol and reevaluate for persistent SI.  On reevaluation, patient appears clinically sober, but is persistently endorsing suicidal ideation and auditory hallucinations.  He states he "plans to drink himself to death".  TTS consulted at this point and disposition pending their recommendations.       Teressa Lower, MD 08/19/21 [phone]

## 2021-08-19 NOTE — Consult Note (Cosign Needed Addendum)
Patient is seems to be responding well to Ativan given earlier.  Patient, per Dr Dwyane Dee can be transferred to [facility] to continue detox treatment.  It appears his Seizures may not be related to Alcohol withdrawal.   Provider have made contact with NP, Shevon at [facility] health facility for a transfer [facility] to continue detox treatment. Addendum:  This patient has previous alcohol withdrawal seizures in the past, based on records it is now determined that patient cannot be transferred to [facility] because he does not meet criteria for [facility] treatment.  He needs detox in a a safe environment for better management of his symptoms.

## 2021-08-20 DIAGNOSIS — Z5900 Homelessness unspecified: Secondary | ICD-10-CM

## 2021-08-20 DIAGNOSIS — F10930 Alcohol use, unspecified with withdrawal, uncomplicated: Secondary | ICD-10-CM | POA: Diagnosis not present

## 2021-08-20 DIAGNOSIS — F332 Major depressive disorder, recurrent severe without psychotic features: Secondary | ICD-10-CM | POA: Diagnosis not present

## 2021-08-20 DIAGNOSIS — F1020 Alcohol dependence, uncomplicated: Secondary | ICD-10-CM | POA: Diagnosis not present

## 2021-08-20 LAB — CBC WITH DIFFERENTIAL/PLATELET
Abs Immature Granulocytes: 0.01 10*3/uL (ref 0.00–0.07)
Basophils Absolute: 0.1 10*3/uL (ref 0.0–0.1)
Basophils Relative: 1 %
Eosinophils Absolute: 0.1 10*3/uL (ref 0.0–0.5)
Eosinophils Relative: 2 %
HCT: 35.4 % — ABNORMAL LOW (ref 39.0–52.0)
Hemoglobin: 11.2 g/dL — ABNORMAL LOW (ref 13.0–17.0)
Immature Granulocytes: 0 %
Lymphocytes Relative: 35 %
Lymphs Abs: 1.5 10*3/uL (ref 0.7–4.0)
MCH: 27.1 pg (ref 26.0–34.0)
MCHC: 31.6 g/dL (ref 30.0–36.0)
MCV: 85.7 fL (ref 80.0–100.0)
Monocytes Absolute: 0.7 10*3/uL (ref 0.1–1.0)
Monocytes Relative: 16 %
Neutro Abs: 2 10*3/uL (ref 1.7–7.7)
Neutrophils Relative %: 46 %
Platelets: 66 10*3/uL — ABNORMAL LOW (ref 150–400)
RBC: 4.13 MIL/uL — ABNORMAL LOW (ref 4.22–5.81)
RDW: 18.6 % — ABNORMAL HIGH (ref 11.5–15.5)
WBC: 4.3 10*3/uL (ref 4.0–10.5)
nRBC: 0 % (ref 0.0–0.2)

## 2021-08-20 LAB — COMPREHENSIVE METABOLIC PANEL
ALT: 42 U/L (ref 0–44)
AST: 121 U/L — ABNORMAL HIGH (ref 15–41)
Albumin: 3.7 g/dL (ref 3.5–5.0)
Alkaline Phosphatase: 120 U/L (ref 38–126)
Anion gap: 9 (ref 5–15)
BUN: 16 mg/dL (ref 6–20)
CO2: 25 mmol/L (ref 22–32)
Calcium: 9.3 mg/dL (ref 8.9–10.3)
Chloride: 104 mmol/L (ref 98–111)
Creatinine, Ser: 0.75 mg/dL (ref 0.61–1.24)
GFR, Estimated: >60 mL/min (ref >60)
Glucose, Bld: 91 mg/dL (ref 70–99)
Potassium: 3.8 mmol/L (ref 3.5–5.1)
Sodium: 138 mmol/L (ref 135–145)
Total Bilirubin: 1.3 mg/dL — ABNORMAL HIGH (ref 0.3–1.2)
Total Protein: 7.5 g/dL (ref 6.5–8.1)

## 2021-08-20 LAB — MAGNESIUM: Magnesium: 2 mg/dL (ref 1.7–2.4)

## 2021-08-20 MED ORDER — ENSURE ENLIVE PO LIQD
237.0000 mL | Freq: Three times a day (TID) | ORAL | Status: DC
  Administered 2021-08-20 – 2021-08-22 (×5): 237 mL via ORAL

## 2021-08-20 NOTE — TOC Initial Note (Signed)
Transition of Care [facility]) - Initial/Assessment Note    Patient Details  Name: Barry Moore MRN: [id number] Date of Birth: [birth year]  Transition of Care [facility]) CM/SW Contact:    Leeroy Cha, RN Phone Number: 08/20/2021, 8:04 AM  Clinical Narrative:                 [facility] at [facility]orial health is following for placement.  Expected Discharge Plan: Psychiatric Hospital Barriers to Discharge: Continued Medical Work up   Patient Goals and CMS Choice Patient states their goals for this hospitalization and ongoing recovery are:: i want to get some help CMS Medicare.gov Compare Post Acute Care list provided to:: Patient    Expected Discharge Plan and Services Expected Discharge Plan: Psychiatric Hospital In-house Referral: Clinical Social Work Discharge Planning Services: CM Consult   Living arrangements for the past 2 months: Homeless                                      Prior Living Arrangements/Services Living arrangements for the past 2 months: Homeless Lives with:: Self Patient language and need for interpreter reviewed:: Yes Do you feel safe going back to the place where you live?: No   see hx notes        Criminal Activity/Legal Involvement Pertinent to Current Situation/Hospitalization: No - Comment as needed  Activities of Daily Living Home Assistive Devices/Equipment: Eyeglasses (reading glasses) ADL Screening (condition at time of admission) Patient's cognitive ability adequate to safely complete daily activities?: Yes Is the patient deaf or have difficulty hearing?: No Does the patient have difficulty seeing, even when wearing glasses/contacts?: No Does the patient have difficulty concentrating, remembering, or making decisions?: Yes ("sometimes") Patient able to express need for assistance with ADLs?: Yes Does the patient have difficulty dressing or bathing?: No Independently performs ADLs?: Yes (appropriate for developmental age) Does the patient  have difficulty walking or climbing stairs?: No Weakness of Legs: Both Weakness of Arms/Hands: Both  Permission Sought/Granted                  Emotional Assessment Appearance:: Appears older than stated age Attitude/Demeanor/Rapport: Avoidant Affect (typically observed): Flat Orientation: : Oriented to Situation, Oriented to Self, Oriented to Place Alcohol / Substance Use: Alcohol Use Psych Involvement: No (comment)  Admission diagnosis:  Alcohol withdrawal ([place]) [F10.939] Chronic alcoholism ([place]) [F10.20] Elevated blood pressure reading [R03.0] Alcohol abuse with alcohol-induced mood disorder ([employer]) [K93.81] Acute alcoholic intoxication without complication ([place]) [W29.937] Patient Active Problem List   Diagnosis Date Noted   MDD (major depressive disorder), recurrent severe, without psychosis ([place]) 07/16/2021   GIB (gastrointestinal bleeding) 06/25/2020   Suicidal ideations 05/31/2020   Nicotine dependence, cigarettes, uncomplicated 16/96/7893   Alcohol abuse with intoxication ([place]) 07/19/2019   Orthostatic syncope 07/19/2019   Upper GI bleeding 07/19/2019   Acute upper GI bleeding 07/02/2019   Duodenitis    Acute upper GI bleed 01/01/2019   Hypokalemia 01/01/2019   Alcohol use disorder, severe, dependence ([place]) 81/03/7508   Alcoholic liver disease ([place]) 01/01/2019   Acute pancreatitis 12/13/2018   Free intraperitoneal air 12/13/2018   Homicidal ideation    Gastrointestinal hemorrhage with melena 08/31/2018   Colon ulcer    Polyp of ascending colon    Acute GI bleeding 08/05/2018   Transaminitis 08/05/2018   Abdominal pain 08/05/2018   Nausea and vomiting 08/05/2018   Substance induced mood disorder ([place]) 07/16/2017   Severe  episode of recurrent major depressive disorder, without psychotic features ([place]) 05/15/2017   Microcytic anemia 03/18/2017   Blood loss anemia 02/21/2017   Upper GI bleed 02/08/2017   Alcohol withdrawal ([place]) 02/08/2017   Cocaine abuse  ([place]) 11/18/2016   GI bleed 11/01/2016   Alcohol abuse    Major depressive disorder, recurrent severe without psychotic features ([place]) 07/06/2016   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   Seizure ([place]) 05/26/2016   Tobacco abuse 05/26/2016   Homeless 05/26/2016   Essential hypertension 05/26/2016   Alcohol dependence with alcohol-induced mood disorder ([place]) 02/14/2016   Severe alcohol withdrawal without perceptual disturbances without complication ([place]) 87/68/1157   Alcoholic hepatitis without ascites 02/14/2016   Thrombocytopenia ([place]) 02/14/2016   PCP:  Merryl Hacker, No Pharmacy:   [facility] [place] [address] Phone: [phone] Fax: [phone]  [place] ([place]), [place] - [address] [place] ([place] [address] Phone: [phone] Fax: [phone]     Social Determinants of Health (SDOH) Interventions    Readmission Risk Interventions    07/20/2019   11:30 AM 01/03/2019    2:37 PM  Readmission Risk Prevention Plan  Transportation Screening Complete Complete  Medication Review ([place]) Complete Complete  PCP or Specialist appointment within 3-5 days of discharge  Not Complete  PCP/Specialist Appt Not Complete comments  Got first available appointments  [place] or [place]  Not Complete  SW Recovery Care/Counseling Consult Complete Not Complete  Palliative Care Screening Not Applicable Not [place] Not Applicable Not Applicable

## 2021-08-20 NOTE — Progress Notes (Signed)
Initial Nutrition Assessment  DOCUMENTATION CODES:   Non-severe (moderate) malnutrition in context of social or environmental circumstances  INTERVENTION:  Encourage adequate PO intake Ensure Enlive po BID, each supplement provides 350 kcal and 20 grams of protein. MVI with minerals daily  NUTRITION DIAGNOSIS:   Moderate Malnutrition related to social / environmental circumstances as evidenced by mild fat depletion, moderate fat depletion, moderate muscle depletion, mild muscle depletion.  GOAL:   Patient will meet greater than or equal to 90% of their needs  MONITOR:   PO intake, Supplement acceptance, Weight trends, Labs  REASON FOR ASSESSMENT:   Malnutrition Screening Tool    ASSESSMENT:   Pt admitted from home with alcohol problems and depression. PMH significant for alcohol abuse, possible alcohol withdrawal seizures, depression, alcoholic hepatitis, pancreatitis, HTN, homelessness, thrombocytopenia and GI bleeding.  Pt states that he has been eating well and is finding foods that he enjoys during admission. PTA he was not eating much. He recalls his meal intake may include chips or nabs when he does eat. Pt has been drinking Ensure and would like to increase from BID to TID. Given pt is malnourished, will increase frequency of nutrition supplements.   Meal completions: 05/31: 75%-breakfast, 75%-lunch, 75%-dinner  Pt states that his usual wt is around 180 lbs and endorses wt loss stating that he now weighs about 164 lbs. This wt loss has occurred within the last few months as he reports eating less recently. Reviewed wt history. Pt appears to have had an 11% wt loss within the last year which is not clinically significant for time frame.    Medications: folvite, MVI, senna, thiamine, LR @ 145m/hr  Labs: AST 121, total bilirubin 1.3  NUTRITION - FOCUSED PHYSICAL EXAM:  Flowsheet Row Most Recent Value  Orbital Region Mild depletion  Upper Arm Region Moderate  depletion  Thoracic and Lumbar Region No depletion  Buccal Region Moderate depletion  Temple Region Mild depletion  Clavicle Bone Region Moderate depletion  Clavicle and Acromion Bone Region Moderate depletion  Scapular Bone Region Moderate depletion  Dorsal Hand No depletion  Patellar Region Moderate depletion  Anterior Thigh Region Mild depletion  Posterior Calf Region Mild depletion  Edema (RD Assessment) None  Hair Reviewed  Eyes Reviewed  Mouth Reviewed  Skin Reviewed  Nails Other (Comment)  [pale]       Diet Order:   Diet Order             Diet regular Room service appropriate? Yes; Fluid consistency: Thin  Diet effective now                   EDUCATION NEEDS:   No education needs have been identified at this time  Skin:  Skin Assessment: Reviewed RN Assessment  Last BM:  5/29 (type 7)  Height:   Ht Readings from Last 1 Encounters:  08/20/21 5' 7.99" (1.727 m)    Weight:   Wt Readings from Last 1 Encounters:  08/20/21 70.9 kg   BMI:  Body mass index is 23.77 kg/m.  Estimated Nutritional Needs:   Kcal:  1900-2100  Protein:  95-110g  Fluid:  >/=1.9L  AClayborne Dana RDN, LDN Clinical Nutrition

## 2021-08-20 NOTE — Consult Note (Signed)
[place] Face to Face Psych Consult Reason for Consult:  Suilcidal ideations, [place]oh[place] detox Referring Physician:  Dr. Starla Link Patient Identification: Barry Moore MRN:  [id number] ED Chief Complaint: Alcohol withdrawal [facility])  Diagnosis:  Principal Problem:   Alcohol withdrawal ([place]) Active Problems:   Homeless   Severe episode of recurrent major depressive disorder, without psychotic features ([place])   Transaminitis   Alcohol use disorder, severe, dependence ([place])   Suicidal ideations   ED Assessment Time Calculation: Start Time: 1215 Stop Time: 1249 Total Time in Minutes (Assessment Completion): 34   Subjective:   Barry Moore is a 56 y.o. male patient admitted with Alcohol intoxication with suicide ideation brought in by EMS with report of Seizure activity earlier during the day  Alcohol level on arrival to ER was 421.  Patient was seen and assessed yesterday by TTS provider, in which he endorsed suicidal ideations with a plan to drink himself to death.  On today's reassessment, patient endorses much improvement in his mood.  He tells me that his suicidal thoughts have gotten better, and contributes his improvement to alcohol detox, appropriate management of such, and sleep.  He describes his mood as" okay better than yesterday" .  Patient endorses ongoing tremors related to alcohol withdrawal, however denies any additional withdrawal symptoms to include nausea or vomiting, diarrhea, muscle aches, chills, or irritability.  When assessing patient's motivation and willingness to change, he replies" I do not know if I am ready yet."  Patient is able to verbalize effects ongoing alcohol use have had on him, as it relates to his depression and suicidal thoughts.  He does express interest in outpatient resources, and appropriate follow-up however feels as though he will return to drinking once discharged from the hospital.  Patient is a 56 year old male who returns to the emergency department with  initial thoughts of suicidal ideation with a plan to drink himself to death.  His initial presentation to the emergency department patient was under the influence of alcohol, blood alcohol level at that time was 421.  He has had numerous presentations which are concerning for alcohol induced mood disorder, alcohol withdrawal complications to include seizures.  On today's evaluation he denies any suicidal ideations, homicidal ideations, and or auditory or visual hallucinations.  He endorses having a history of psychosis, however denies any at this time and all within the past 48 hours.  Despite current impacts and negative effects of alcohol, patient has suicidal ideations related to his chronic alcohol use, at this time he expresses no interest in quitting his alcohol use but is seeking help for outpatient resources.  Patient does seem to be medically improving, as a response to CIWA protocol and Ativan detox.  Recommend continuing current medication recommendations, patient will be psychiatrically cleared as he no longer meets criteria for inpatient psychiatric hospitalizations.  Patient's initial suicidal ideation has resolved.  HPI:   56 y.o. male with medical history significant of alcohol abuse, possible alcohol withdrawal seizures, depression, alcoholic hepatitis, pancreatitis, hypertension, homelessness, thrombocytopenia, GI bleeding presented with worsening anxiety and depression along with suicidal ideation and auditory hallucinations due to homelessness and ongoing alcohol use.  On presentation, alcohol  level was 421.  He was started on CIWA protocol. Patient was evaluated by psychiatry and initially the plan was for the patient to be admitted to [facility] but because of history of alcohol withdrawal seizures, it was recommended that patient be admitted to medical service first   Past Psychiatric History: Alcohol Dependence/Abuse, Recurrent Major Depressive  disorder,   Risk to Self or Others: Is the  patient at risk to self? Yes Has the patient been a risk to self in the past 6 months? Yes Has the patient been a risk to self within the distant past? Yes Is the patient a risk to others? No Has the patient been a risk to others in the past 6 months? No Has the patient been a risk to others within the distant past? No  [place] Scale:  [place] ED to Hosp-Admission (Current) from 08/18/2021 in [place] ED from 08/07/2021 in [place] DEPT Admission (Discharged) from 07/16/2021 in [place] 400B  C-SSRS RISK CATEGORY High Risk Low Risk High Risk       AIMS:  , , ,  ,   ASAM:    Substance Abuse:     Past Medical History:  Past Medical History:  Diagnosis Date   Alcoholic hepatitis    Depression    ETOH abuse    Gastric bleed 08/2018   Hypertension    Pancreatitis    Seizures ([place])    Suicidal behavior     Past Surgical History:  Procedure Laterality Date   BIOPSY  08/05/2018   Procedure: BIOPSY;  Surgeon: Irving Copas., MD;  Location: [facility] ENDOSCOPY;  Service: Gastroenterology;;   BIOPSY  08/07/2018   Procedure: BIOPSY;  Surgeon: Thornton Park, MD;  Location: [facility] ENDOSCOPY;  Service: Gastroenterology;;   BIOPSY  01/02/2019   Procedure: BIOPSY;  Surgeon: Gatha Mayer, MD;  Location: [facility] ENDOSCOPY;  Service: Endoscopy;;   COLONOSCOPY WITH PROPOFOL N/A 08/07/2018   Procedure: COLONOSCOPY WITH PROPOFOL;  Surgeon: Thornton Park, MD;  Location: [place];  Service: Gastroenterology;  Laterality: N/A;   ESOPHAGOGASTRODUODENOSCOPY N/A 01/02/2019   Procedure: ESOPHAGOGASTRODUODENOSCOPY (EGD);  Surgeon: Gatha Mayer, MD;  Location: [facility] END[facility];  Service: Endoscopy;  Laterality: N/A;   ESOPHAGOGASTRODUODENOSCOPY N/A 07/03/2019   Procedure: ESOPHAGOGASTRODUODENOSCOPY (EGD);  Surgeon: Wonda Horner, MD;  Location: [facility] ENDOSCOPY;  Service: Endoscopy;  Laterality:  N/A;   ESOPHAGOGASTRODUODENOSCOPY N/A 06/26/2020   Procedure: ESOPHAGOGASTRODUODENOSCOPY (EGD);  Surgeon: Arta Silence, MD;  Location: [facility] ENDOSCOPY;  Service: Endoscopy;  Laterality: N/A;   ESOPHAGOGASTRODUODENOSCOPY (EGD) WITH PROPOFOL N/A 11/02/2016   Procedure: ESOPHAGOGASTRODUODENOSCOPY (EGD) WITH PROPOFOL;  Surgeon: Carol Ada, MD;  Location: [facility] ENDOSCOPY;  Service: Endoscopy;  Laterality: N/A;   ESOPHAGOGASTRODUODENOSCOPY (EGD) WITH PROPOFOL N/A 08/05/2018   Procedure: ESOPHAGOGASTRODUODENOSCOPY (EGD) WITH PROPOFOL;  Surgeon: Rush Landmark Telford Nab., MD;  Location: [place];  Service: Gastroenterology;  Laterality: N/A;   ESOPHAGOGASTRODUODENOSCOPY (EGD) WITH PROPOFOL N/A 07/14/2019   Procedure: ESOPHAGOGASTRODUODENOSCOPY (EGD) WITH PROPOFOL;  Surgeon: Otis Brace, MD;  Location: [facility] ENDOSCOPY;  Service: Gastroenterology;  Laterality: N/A;   HERNIA REPAIR     LEG SURGERY     POLYPECTOMY  08/07/2018   Procedure: POLYPECTOMY;  Surgeon: Thornton Park, MD;  Location: [facility] ENDOSCOPY;  Service: Gastroenterology;;   Family History:  Family History  Problem Relation Age of Onset   Diabetes Mother    Alcoholism Mother    Emphysema Father    Lung cancer Father    Alcoholism Father    Family Psychiatric  History: unknown Social History:  Social History   Substance and Sexual Activity  Alcohol Use Yes   Comment: 5+ BEERS DAILY, Patient does not know how much he drank tonight     Social History   Substance and Sexual Activity  Drug Use Not Currently  Frequency: 3.0 times per week   Types: Cocaine    Social History   Socioeconomic History   Marital status: Divorced    Spouse name: Not on file   Number of children: Not on file   Years of education: Not on file   Highest education level: Not on file  Occupational History   Not on file  Tobacco Use   Smoking status: Every Day    Packs/day: 1.00    Types: Cigarettes   Smokeless tobacco: Never  Vaping Use   Vaping  Use: Never used  Substance and Sexual Activity   Alcohol use: Yes    Comment: 5+ BEERS DAILY, Patient does not know how much he drank tonight   Drug use: Not Currently    Frequency: 3.0 times per week    Types: Cocaine   Sexual activity: Not Currently  Other Topics Concern   Not on file  Social History Narrative   Not on file   Social Determinants of Health   Financial Resource Strain: Not on file  Food Insecurity: Not on file  Transportation Needs: Not on file  Physical Activity: Not on file  Stress: Not on file  Social Connections: Not on file   Additional Social History:    Allergies:   Allergies  Allergen Reactions   Tomato Shortness Of Breath and Nausea And Vomiting   Aspirin Nausea And Vomiting   Sulfa Antibiotics Other (See Comments)    "Does not make me feel good"    Labs:  Results for orders placed or performed during the hospital encounter of 08/18/21 (from the past 48 hour(s))  Comprehensive metabolic panel     Status: Abnormal   Collection Time: 08/18/21 10:00 PM  Result Value Ref Range   Sodium 140 135 - 145 mmol/L   Potassium 3.7 3.5 - 5.1 mmol/L   Chloride 105 98 - 111 mmol/L   CO2 28 22 - 32 mmol/L   Glucose, Bld 104 (H) 70 - 99 mg/dL    Comment: Glucose reference range applies only to samples taken after fasting for at least 8 hours.   BUN 9 6 - 20 mg/dL   Creatinine, Ser 0.87 0.61 - 1.24 mg/dL   Calcium 8.9 8.9 - 10.3 mg/dL   Total Protein 8.4 (H) 6.5 - 8.1 g/dL   Albumin 4.5 3.5 - 5.0 g/dL   AST 61 (H) 15 - 41 U/L   ALT 28 0 - 44 U/L   Alkaline Phosphatase 118 38 - 126 U/L   Total Bilirubin 0.5 0.3 - 1.2 mg/dL   GFR, Estimated >60 >60 mL/min    Comment: (NOTE) Calculated using the CKD-EPI Creatinine Equation (2021)    Anion gap 7 5 - 15    Comment: Performed at [facility], [place] [address] [place], [place] [address]  Ethanol     Status: Abnormal   Collection Time: 08/18/21 10:00 PM  Result Value Ref Range   Alcohol,  Ethyl (B) 421 (HH) <10 mg/dL    Comment: CRITICAL RESULT CALLED TO, READ BACK BY AND VERIFIED WITH:  KATY GESELL RN 08/18/21 @ 2236 VS (NOTE) Lowest detectable limit for serum alcohol is 10 mg/dL.  For medical purposes only. Performed at [facility], [place] [address] [place], [place] [address]   Salicylate level     Status: Abnormal   Collection Time: 08/18/21 10:00 PM  Result Value Ref Range   Salicylate Lvl <7.5 (L) 7.0 - 30.0 mg/dL    Comment:  Performed at [facility], [place] [address] [place], [place] [address]  Acetaminophen level     Status: Abnormal   Collection Time: 08/18/21 10:00 PM  Result Value Ref Range   Acetaminophen (Tylenol), Serum <10 (L) 10 - 30 ug/mL    Comment: (NOTE) Therapeutic concentrations vary significantly. A range of 10-30 ug/mL  may be an effective concentration for many patients. However, some  are best treated at concentrations outside of this range. Acetaminophen concentrations >150 ug/mL at 4 hours after ingestion  and >50 ug/mL at 12 hours after ingestion are often associated with  toxic reactions.  Performed at [facility], [place] [address] [place], [place] [address]   cbc     Status: Abnormal   Collection Time: 08/18/21 10:00 PM  Result Value Ref Range   WBC 5.7 4.0 - 10.5 K/uL   RBC 4.41 4.22 - 5.81 MIL/uL   Hemoglobin 12.2 (L) 13.0 - 17.0 g/dL   HCT 37.3 (L) 39.0 - 52.0 %   MCV 84.6 80.0 - 100.0 fL   MCH 27.7 26.0 - 34.0 pg   MCHC 32.7 30.0 - 36.0 g/dL   RDW 18.8 (H) 11.5 - 15.5 %   Platelets 95 (L) 150 - 400 K/uL    Comment: SPECIMEN CHECKED FOR CLOTS Immature Platelet Fraction may be clinically indicated, consider ordering this additional test [id number] REPEATED TO VERIFY PLATELET COUNT CONFIRMED BY SMEAR    nRBC 0.0 0.0 - 0.2 %    Comment: Performed at [facility], [place] [address], [place], [place] [address]  Rapid urine drug screen (hospital  performed)     Status: None   Collection Time: 08/18/21 10:00 PM  Result Value Ref Range   Opiates NONE DETECTED NONE DETECTED   Cocaine NONE DETECTED NONE DETECTED   Benzodiazepines NONE DETECTED NONE DETECTED   Amphetamines NONE DETECTED NONE DETECTED   Tetrahydrocannabinol NONE DETECTED NONE DETECTED   Barbiturates NONE DETECTED NONE DETECTED    Comment: (NOTE) DRUG SCREEN FOR MEDICAL PURPOSES ONLY.  IF CONFIRMATION IS NEEDED FOR ANY PURPOSE, NOTIFY LAB WITHIN 5 DAYS.  LOWEST DETECTABLE LIMITS FOR URINE DRUG SCREEN Drug Class                     Cutoff (ng/mL) Amphetamine and metabolites    1000 Barbiturate and metabolites    200 Benzodiazepine                 188 Tricyclics and metabolites     300 Opiates and metabolites        300 Cocaine and metabolites        300 THC                            50 Performed at [facility], [place] [address], [place], [place] [address]   Comprehensive metabolic panel     Status: Abnormal   Collection Time: 08/20/21  4:15 AM  Result Value Ref Range   Sodium 138 135 - 145 mmol/L   Potassium 3.8 3.5 - 5.1 mmol/L   Chloride 104 98 - 111 mmol/L   CO2 25 22 - 32 mmol/L   Glucose, Bld 91 70 - 99 mg/dL    Comment: Glucose reference range applies only to samples taken after fasting for at least 8 hours.   BUN 16 6 - 20 mg/dL   Creatinine, Ser 0.75 0.61 - 1.24 mg/dL  Calcium 9.3 8.9 - 10.3 mg/dL   Total Protein 7.5 6.5 - 8.1 g/dL   Albumin 3.7 3.5 - 5.0 g/dL   AST 121 (H) 15 - 41 U/L   ALT 42 0 - 44 U/L   Alkaline Phosphatase 120 38 - 126 U/L   Total Bilirubin 1.3 (H) 0.3 - 1.2 mg/dL   GFR, Estimated >60 >60 mL/min    Comment: (NOTE) Calculated using the CKD-EPI Creatinine Equation (2021)    Anion gap 9 5 - 15    Comment: Performed at [facility], [place] [address] [place], [place] [address]  Magnesium     Status: None   Collection Time: 08/20/21  4:15 AM  Result Value Ref Range   Magnesium 2.0 1.7  - 2.4 mg/dL    Comment: Performed at [facility], [place] [address] [place], [place] [address]  CBC with Differential/Platelet     Status: Abnormal   Collection Time: 08/20/21  4:15 AM  Result Value Ref Range   WBC 4.3 4.0 - 10.5 K/uL   RBC 4.13 (L) 4.22 - 5.81 MIL/uL   Hemoglobin 11.2 (L) 13.0 - 17.0 g/dL   HCT 35.4 (L) 39.0 - 52.0 %   MCV 85.7 80.0 - 100.0 fL   MCH 27.1 26.0 - 34.0 pg   MCHC 31.6 30.0 - 36.0 g/dL   RDW 18.6 (H) 11.5 - 15.5 %   Platelets 66 (L) 150 - 400 K/uL    Comment: SPECIMEN CHECKED FOR CLOTS Immature Platelet Fraction may be clinically indicated, consider ordering this additional test [id number] REPEATED TO VERIFY    nRBC 0.0 0.0 - 0.2 %   Neutrophils Relative % 46 %   Neutro Abs 2.0 1.7 - 7.7 K/uL   Lymphocytes Relative 35 %   Lymphs Abs 1.5 0.7 - 4.0 K/uL   Monocytes Relative 16 %   Monocytes Absolute 0.7 0.1 - 1.0 K/uL   Eosinophils Relative 2 %   Eosinophils Absolute 0.1 0.0 - 0.5 K/uL   Basophils Relative 1 %   Basophils Absolute 0.1 0.0 - 0.1 K/uL   Immature Granulocytes 0 %   Abs Immature Granulocytes 0.01 0.00 - 0.07 K/uL    Comment: Performed at [facility], [place] [address] [place], [place] [address]    Current Facility-Administered Medications  Medication Dose Route Frequency Provider Last Rate Last Admin   acetaminophen (TYLENOL) tablet 650 mg  650 mg Oral Q6H PRN Aline August, MD   650 mg at 08/19/21 1853   Or   acetaminophen (TYLENOL) suppository 650 mg  650 mg Rectal Q6H PRN Aline August, MD       bisacodyl (DULCOLAX) suppository 10 mg  10 mg Rectal Daily PRN Aline August, MD       feeding supplement (ENSURE ENLIVE / ENSURE PLUS) liquid 237 mL  237 mL Oral BID BM Alekh, Kshitiz, MD   237 mL at 66/29/47 6546   folic acid (FOLVITE) tablet 1 mg  1 mg Oral Daily Starla Link, Kshitiz, MD   1 mg at 08/20/21 5035   lactated ringers infusion   Intravenous Continuous Aline August, MD 100 mL/hr at 08/20/21  0449 New Bag at 08/20/21 0449   LORazepam (ATIVAN) tablet 1-4 mg  1-4 mg Oral Q1H PRN Aline August, MD   1 mg at 08/20/21 0236   Or   LORazepam (ATIVAN) injection 1-4 mg  1-4 mg Intravenous Q1H PRN Aline August, MD       LORazepam (ATIVAN) tablet  0-4 mg  0-4 mg Oral Q6H Alekh, Kshitiz, MD   1 mg at 08/20/21 1107   Followed by   Derrill Memo ON 08/21/2021] LORazepam (ATIVAN) tablet 0-4 mg  0-4 mg Oral Q12H Alekh, Kshitiz, MD       multivitamin with minerals tablet 1 tablet  1 tablet Oral Daily Starla Link, Kshitiz, MD   1 tablet at 08/20/21 0823   ondansetron (ZOFRAN) tablet 4 mg  4 mg Oral Q6H PRN Aline August, MD       Or   ondansetron (ZOFRAN) injection 4 mg  4 mg Intravenous Q6H PRN Alekh, Kshitiz, MD       polyethylene glycol (MIRALAX / GLYCOLAX) packet 17 g  17 g Oral Daily PRN Aline August, MD   17 g at 08/19/21 1853   senna-docusate (Senokot-S) tablet 1 tablet  1 tablet Oral BID Aline August, MD   1 tablet at 08/20/21 5188   thiamine tablet 100 mg  100 mg Oral Daily Starla Link, Kshitiz, MD   100 mg at 08/20/21 4166   Or   thiamine (B-1) injection 100 mg  100 mg Intravenous Daily Aline August, MD        Musculoskeletal: Strength & Muscle Tone:  in bed resting Gait & Station:  in bed resting Patient leans:  see above.   Psychiatric Specialty Exam: Presentation  General Appearance: Appropriate for Environment; Disheveled  Eye Contact:Fair  Speech:Clear and Coherent; Slow  Speech Volume:Normal  Handedness:Right   Mood and Affect  Mood:Anxious; Depressed  Affect:Congruent   Thought Process  Thought Processes:Coherent; Linear  Descriptions of Associations:Intact  Orientation:Full (Time, Place and Person)  Thought Content:WDL  History of Schizophrenia/Schizoaffective disorder:No  Duration of Psychotic Symptoms:N/A  Hallucinations:Hallucinations: None  Ideas of Reference:None  Suicidal Thoughts:Suicidal Thoughts: No SI Active Intent and/or Plan: With Intent; With  Plan; With Means to [place]; With Access to Means  Homicidal Thoughts:Homicidal Thoughts: No   Sensorium  Memory:Immediate Fair; Recent Fair; Remote [place]  Insight:Fair   Executive Functions  Concentration:Good  Attention Span:Good  [place]  Language:Good   Psychomotor Activity  Psychomotor Activity:Psychomotor Activity: Tremor   Assets  Assets:Communication Skills; Physical Health; Resilience; Social Support    Sleep  Sleep:Sleep: Fair   Physical Exam: Physical Exam Vitals (Disheveled) and nursing note reviewed.  Constitutional:      Appearance: He is obese.  HENT:     Head: Normocephalic and atraumatic.     Nose: Nose normal.  Cardiovascular:     Rate and Rhythm: Normal rate and regular rhythm.  Pulmonary:     Effort: Pulmonary effort is normal.  Musculoskeletal:        General: Normal range of motion.     Cervical back: Normal range of motion.  Skin:    General: Skin is warm and dry.  Neurological:     General: No focal deficit present.     Mental Status: He is alert and oriented to person, place, and time.   Review of Systems  Constitutional:  Positive for malaise/fatigue.  HENT: Negative.    Eyes: Negative.   Respiratory: Negative.    Cardiovascular: Negative.   Gastrointestinal: Negative.   Genitourinary: Negative.   Musculoskeletal: Negative.   Skin: Negative.   Neurological:  Positive for seizures.       Alcohol withdrawal seizures  Endo/Heme/Allergies: Negative.   Psychiatric/Behavioral:  Positive for depression and suicidal ideas. The patient is nervous/anxious.   Blood pressure (!) 162/78, pulse 74, temperature 98.1 F (  36.7 C), temperature source Oral, resp. rate 17, height 5' 7.99" (1.727 m), weight 70.9 kg, SpO2 91 %. Body mass index is 23.77 kg/m.  Medical Decision Making: Patient initially presented with suicidal ideations while under the influence of alcohol, with a plan to drink  himself to death.  On today's reassessment, patient endorses improvement in mood and no longer having suicidal ideations.  He contributes improvement in reduction of suicidal ideations to appropriate alcohol detox management and rest.  He does not appear to be interested and/or motivated in seeking inpatient rehabilitation, however seeks interest and outpatient psychiatric resources.  He is encouraged to present to  [facility]. When ready to seek inpatient rehabilitation services.   Continue with CIWA protocol and Ativan detox.  Due to history of alcohol withdrawal seizures, recommend seizure precautions.  -Continue safety sitter, seizure precautions, CIWA protocol, and Ativan detox.  Although patient no longer requires suicide sitter, recommend continuing safety sitter as patient has high risk for developing delirium tremens, and will need close observation. -TOC consult for outpatient substance abuse services.  Patient may be a good candidate for referral to [facility]. -Consider labs for TSH, lipid panel, Q3F, folic acid, vitamin H54 and thiamine.  Problem 1: Alcohol use disorder/Dependence  Problem 2: Major Depressive disorder,recurrent.   Disposition:  Psychiatrically cleared.  Suella Broad, FNP 08/20/2021 12:24 PM

## 2021-08-20 NOTE — Progress Notes (Signed)
PROGRESS NOTE    Barry Moore  IRC:[id number] DOB: 15-Jul-1965 DOA: 08/18/2021 PCP: Pcp, No   Brief Narrative:   56 y.o. male with medical history significant of alcohol abuse, possible alcohol withdrawal seizures, depression, alcoholic hepatitis, pancreatitis, hypertension, homelessness, thrombocytopenia, GI bleeding presented with worsening anxiety and depression along with suicidal ideation and auditory hallucinations due to homelessness and ongoing alcohol use.  On presentation, alcohol  level was 421.  He was started on CIWA protocol. Patient was evaluated by psychiatry and initially the plan was for the patient to be admitted to [facility] but because of history of alcohol withdrawal seizures, it was recommended that patient be admitted to medical service first.  Assessment & Plan:     Alcohol abuse with intoxication Concern for alcohol withdrawal History of alcohol withdrawal seizure -Patient presented with alcohol level of 421.  There has been no evidence of any witnessed seizures but because of alcohol withdrawal seizure history, psychiatry recommended that patient be treated for alcohol withdrawal under medical service. -Continue CIWA protocol along with thiamine, multivitamin and folic acid. -TOC consult -Seizure precautions   Suicidal ideation Severe depression -Patient will need admission to inpatient psychiatric facility once medically improved.  Consult psychiatry.  Psychiatric medications as per psychiatry recommendations. -Continue sitter at bedside   Elevated LFTs -Possibly from alcohol abuse.  Monitor   Thrombocytopenia -Chronic.  From alcohol abuse.  No signs of bleeding.  Monitor   Hypertension -Blood pressure on the high side.  Possibly from withdrawal.  Does not take any antihypertensives at home.  Monitor.  Start amlodipine.   Homelessness -TOC consult   Normocytic anemia -From chronic illnesses and alcohol abuse.  Hemoglobin stable.  Monitor intermittently    DVT prophylaxis: SCDs.  Avoid Lovenox because of thrombocytopenia Code Status: Full Family Communication: None at bedside Disposition Plan: Status is: Inpatient Remains inpatient appropriate because: Inpatient psychiatric facility once medically cleared    Consultants: Psychiatry  Procedures: None  Antimicrobials: None   Subjective: Patient seen and examined at bedside.  Poor historian.  No overnight seizures, agitation or vomiting reported.  Complains of being shaky.  Sitter present at bedside  Objective: Vitals:   08/19/21 2356 08/20/21 0215 08/20/21 0500 08/20/21 0554  BP: (!) 167/85 (!) 172/72  (!) 162/78  Pulse: 73 68  74  Resp:  18  17  Temp:  97.7 F (36.5 C)  98.1 F (36.7 C)  TempSrc:  Oral  Oral  SpO2:  97%  91%  Weight:   70.9 kg 70.9 kg  Height:    5' 7.99" (1.727 m)    Intake/Output Summary (Last 24 hours) at 08/20/2021 0737 Last data filed at 08/20/2021 0300 Gross per 24 hour  Intake 1649.96 ml  Output --  Net 1649.96 ml   Filed Weights   08/20/21 0500 08/20/21 0554  Weight: 70.9 kg 70.9 kg    Examination:  General exam: Appears calm and comfortable.  Currently on room air.  Looks chronically ill and deconditioned and very disheveled  respiratory system: Bilateral decreased breath sounds at bases, no wheezing Cardiovascular system: S1 & S2 heard, Rate controlled Gastrointestinal system: Abdomen is nondistended, soft and nontender. Normal bowel sounds heard. Extremities: No cyanosis, clubbing, edema  Central nervous system: Sleepy, wakes up slightly, slow to respond, poor historian. No focal neurological deficits. Moving extremities Skin: No rashes, lesions or ulcers Psychiatry: Affect is mostly flat.  No signs of agitation.    Data Reviewed: I have personally reviewed following labs and imaging studies  CBC: Recent Labs  Lab 08/18/21 2200 08/20/21 0415  WBC 5.7 4.3  NEUTROABS  --  2.0  HGB 12.2* 11.2*  HCT 37.3* 35.4*  MCV 84.6 85.7   PLT 95* 66*   Basic Metabolic Panel: Recent Labs  Lab 08/18/21 2200 08/20/21 0415  NA 140 138  K 3.7 3.8  CL 105 104  CO2 28 25  GLUCOSE 104* 91  BUN 9 16  CREATININE 0.87 0.75  CALCIUM 8.9 9.3  MG  --  2.0   GFR: Estimated Creatinine Clearance: 99.8 mL/min (by C-G formula based on SCr of 0.75 mg/dL). Liver Function Tests: Recent Labs  Lab 08/18/21 2200 08/20/21 0415  AST 61* 121*  ALT 28 42  ALKPHOS 118 120  BILITOT 0.5 1.3*  PROT 8.4* 7.5  ALBUMIN 4.5 3.7   No results for input(s): LIPASE, AMYLASE in the last 168 hours. No results for input(s): AMMONIA in the last 168 hours. Coagulation Profile: No results for input(s): INR, PROTIME in the last 168 hours. Cardiac Enzymes: No results for input(s): CKTOTAL, CKMB, CKMBINDEX, TROPONINI in the last 168 hours. BNP (last 3 results) No results for input(s): PROBNP in the last 8760 hours. HbA1C: No results for input(s): HGBA1C in the last 72 hours. CBG: No results for input(s): GLUCAP in the last 168 hours. Lipid Profile: No results for input(s): CHOL, HDL, LDLCALC, TRIG, CHOLHDL, LDLDIRECT in the last 72 hours. Thyroid Function Tests: No results for input(s): TSH, T4TOTAL, FREET4, T3FREE, THYROIDAB in the last 72 hours. Anemia Panel: No results for input(s): VITAMINB12, FOLATE, FERRITIN, TIBC, IRON, RETICCTPCT in the last 72 hours. Sepsis Labs: No results for input(s): PROCALCITON, LATICACIDVEN in the last 168 hours.  No results found for this or any previous visit (from the past 240 hour(s)).       Radiology Studies: No results found.      Scheduled Meds:  feeding supplement  237 mL Oral BID BM   folic acid  1 mg Oral Daily   LORazepam  0-4 mg Oral Q6H   Followed by   Derrill Memo ON 08/21/2021] LORazepam  0-4 mg Oral Q12H   multivitamin with minerals  1 tablet Oral Daily   senna-docusate  1 tablet Oral BID   thiamine  100 mg Oral Daily   Or   thiamine  100 mg Intravenous Daily   Continuous  Infusions:  lactated ringers 100 mL/hr at 08/20/21 0449          Aline August, MD [facility] 08/20/2021, 7:37 AM

## 2021-08-21 DIAGNOSIS — Z5900 Homelessness unspecified: Secondary | ICD-10-CM | POA: Diagnosis not present

## 2021-08-21 DIAGNOSIS — F1020 Alcohol dependence, uncomplicated: Secondary | ICD-10-CM | POA: Diagnosis not present

## 2021-08-21 DIAGNOSIS — F332 Major depressive disorder, recurrent severe without psychotic features: Secondary | ICD-10-CM | POA: Diagnosis not present

## 2021-08-21 DIAGNOSIS — F10930 Alcohol use, unspecified with withdrawal, uncomplicated: Secondary | ICD-10-CM | POA: Diagnosis not present

## 2021-08-21 LAB — CBC WITH DIFFERENTIAL/PLATELET
Abs Immature Granulocytes: 0.01 10*3/uL (ref 0.00–0.07)
Basophils Absolute: 0 10*3/uL (ref 0.0–0.1)
Basophils Relative: 1 %
Eosinophils Absolute: 0.1 10*3/uL (ref 0.0–0.5)
Eosinophils Relative: 1 %
HCT: 34.4 % — ABNORMAL LOW (ref 39.0–52.0)
Hemoglobin: 11.1 g/dL — ABNORMAL LOW (ref 13.0–17.0)
Immature Granulocytes: 0 %
Lymphocytes Relative: 33 %
Lymphs Abs: 1.8 10*3/uL (ref 0.7–4.0)
MCH: 27.3 pg (ref 26.0–34.0)
MCHC: 32.3 g/dL (ref 30.0–36.0)
MCV: 84.5 fL (ref 80.0–100.0)
Monocytes Absolute: 0.7 10*3/uL (ref 0.1–1.0)
Monocytes Relative: 13 %
Neutro Abs: 2.8 10*3/uL (ref 1.7–7.7)
Neutrophils Relative %: 52 %
Platelets: 73 10*3/uL — ABNORMAL LOW (ref 150–400)
RBC: 4.07 MIL/uL — ABNORMAL LOW (ref 4.22–5.81)
RDW: 18.6 % — ABNORMAL HIGH (ref 11.5–15.5)
WBC: 5.5 10*3/uL (ref 4.0–10.5)
nRBC: 0 % (ref 0.0–0.2)

## 2021-08-21 LAB — COMPREHENSIVE METABOLIC PANEL
ALT: 56 U/L — ABNORMAL HIGH (ref 0–44)
AST: 138 U/L — ABNORMAL HIGH (ref 15–41)
Albumin: 3.7 g/dL (ref 3.5–5.0)
Alkaline Phosphatase: 151 U/L — ABNORMAL HIGH (ref 38–126)
Anion gap: 7 (ref 5–15)
BUN: 16 mg/dL (ref 6–20)
CO2: 24 mmol/L (ref 22–32)
Calcium: 9.1 mg/dL (ref 8.9–10.3)
Chloride: 106 mmol/L (ref 98–111)
Creatinine, Ser: 0.72 mg/dL (ref 0.61–1.24)
GFR, Estimated: >60 mL/min (ref >60)
Glucose, Bld: 114 mg/dL — ABNORMAL HIGH (ref 70–99)
Potassium: 3.7 mmol/L (ref 3.5–5.1)
Sodium: 137 mmol/L (ref 135–145)
Total Bilirubin: 0.6 mg/dL (ref 0.3–1.2)
Total Protein: 7.1 g/dL (ref 6.5–8.1)

## 2021-08-21 LAB — MAGNESIUM: Magnesium: 2 mg/dL (ref 1.7–2.4)

## 2021-08-21 MED ORDER — AMLODIPINE BESYLATE 5 MG PO TABS
5.0000 mg | ORAL_TABLET | Freq: Every day | ORAL | Status: DC
  Administered 2021-08-21 – 2021-08-22 (×2): 5 mg via ORAL
  Filled 2021-08-21 (×2): qty 1

## 2021-08-21 NOTE — TOC Progression Note (Signed)
Transition of Care [facility]) - Progression Note    Patient Details  Name: [name] MRN: [id number] Date of Birth: [birth year]  Transition of Care [facility]) CM/SW Contact  Leeroy Cha, RN Phone Number: 08/21/2021, 12:15 PM  Clinical Narrative:    Mental health and substance abuse resources given to patient.   Expected Discharge Plan: Psychiatric Hospital Barriers to Discharge: Continued Medical Work up  Expected Discharge Plan and Services Expected Discharge Plan: Psychiatric Hospital In-house Referral: Clinical Social Work Discharge Planning Services: CM Consult   Living arrangements for the past 2 months: Homeless                                       Social Determinants of Health (SDOH) Interventions    Readmission Risk Interventions    07/20/2019   11:30 AM 01/03/2019    2:37 PM  Readmission Risk Prevention Plan  Transportation Screening Complete Complete  Medication Review (RN Care Manager) Complete Complete  PCP or Specialist appointment within 3-5 days of discharge  Not Complete  PCP/Specialist Appt Not Complete comments  Got first available appointments  [place] or [place]  Not Complete  SW Recovery Care/Counseling Consult Complete Not Complete  Palliative Care Screening Not Applicable Not [place] Not Applicable Not Applicable

## 2021-08-21 NOTE — Plan of Care (Signed)
  Problem: Education: Goal: Knowledge of General Education information will improve Description: Including pain rating scale, medication(s)/side effects and non-pharmacologic comfort measures Outcome: Progressing   Problem: Clinical Measurements: Goal: Will remain free from infection Outcome: Progressing Goal: Diagnostic test results will improve Outcome: Progressing Goal: Respiratory complications will improve Outcome: Progressing   Problem: Elimination: Goal: Will not experience complications related to bowel motility Outcome: Progressing Goal: Will not experience complications related to urinary retention Outcome: Progressing   Problem: Pain Managment: Goal: General experience of comfort will improve Outcome: Progressing

## 2021-08-21 NOTE — Progress Notes (Signed)
PROGRESS NOTE    Barry Moore  XTK:[id number] DOB: 12/09/1965 DOA: 08/18/2021 PCP: Pcp, No   Brief Narrative:   56 y.o. male with medical history significant of alcohol abuse, possible alcohol withdrawal seizures, depression, alcoholic hepatitis, pancreatitis, hypertension, homelessness, thrombocytopenia, GI bleeding presented with worsening anxiety and depression along with suicidal ideation and auditory hallucinations due to homelessness and ongoing alcohol use.  On presentation, alcohol  level was 421.  He was started on CIWA protocol. Patient was evaluated by psychiatry and initially the plan was for the patient to be admitted to [facility] but because of history of alcohol withdrawal seizures, it was recommended that patient be admitted to medical service first.  Assessment & Plan:     Alcohol abuse with intoxication Concern for alcohol withdrawal History of alcohol withdrawal seizure -Patient presented with alcohol level of 421.  There has been no evidence of any witnessed seizures but because of alcohol withdrawal seizure history, psychiatry recommended that patient be treated for alcohol withdrawal under medical service. -Continue CIWA protocol along with thiamine, multivitamin and folic acid. -TOC consult -Seizure precautions   Suicidal ideation Severe depression -Psych evaluation appreciated.  Apparently, patient is no longer having suicidal ideations.  Psych recommends no inpatient psychiatric hospitalization.   Elevated LFTs -Possibly from alcohol abuse.  Monitor intermittently   Thrombocytopenia -Chronic.  From alcohol abuse.  No signs of bleeding.  Monitor   Hypertension -Blood pressure on the high side.  Possibly from withdrawal.  Does not take any antihypertensives at home.  Monitor.  Start amlodipine.   Homelessness -TOC consult   Normocytic anemia -From chronic illnesses and alcohol abuse.  Hemoglobin stable.  Monitor intermittently   DVT prophylaxis: SCDs.  Avoid  Lovenox because of thrombocytopenia Code Status: Full Family Communication: None at bedside Disposition Plan: Status is: Inpatient Remains inpatient appropriate because: Of severity of illness  Consultants: Psychiatry  Procedures: None  Antimicrobials: None   Subjective: Patient seen and examined at bedside.  Poor historian.  No fever, agitation, chest pain reported.  Shakes are improving. Objective: Vitals:   08/20/21 1915 08/21/21 0200 08/21/21 0500 08/21/21 0705  BP: (!) 164/81 (!) 172/80  (!) 164/87  Pulse: 99 77  80  Resp: '18 18  18  '$ Temp: 97.8 F (36.6 C) 97.9 F (36.6 C)  97.8 F (36.6 C)  TempSrc: Oral Oral  Oral  SpO2: 98% 96%  98%  Weight:   75.3 kg   Height:        Intake/Output Summary (Last 24 hours) at 08/21/2021 0826 Last data filed at 08/21/2021 0500 Gross per 24 hour  Intake 4031.84 ml  Output --  Net 4031.84 ml    Filed Weights   08/20/21 0500 08/20/21 0554 08/21/21 0500  Weight: 70.9 kg 70.9 kg 75.3 kg    Examination:  General: On room air.  No distress.  Looks chronically ill/deconditioned.  Extremely poor historian. ENT/neck: No thyromegaly.  JVD is not elevated  respiratory: Decreased breath sounds at bases bilaterally with some crackles; no wheezing  CVS: S1-S2 heard, rate controlled currently Abdominal: Soft, nontender, slightly distended; no organomegaly, normal bowel sounds are heard Extremities: Trace lower extremity edema; no cyanosis  CNS: Awake this morning, still slow to respond but improving since yesterday.  Answers some questions.  No abnormal body movements noted.  No focal neurologic deficit.  Moves extremities Lymph: No obvious lymphadenopathy Skin: No obvious ecchymosis/lesions  psych: Flat affect.  Able to participate in conversation better than yesterday.  No signs of  agitation currently.   Musculoskeletal: No obvious joint swelling/deformity     Data Reviewed: I have personally reviewed following labs and imaging  studies  CBC: Recent Labs  Lab 08/18/21 2200 08/20/21 0415 08/21/21 0359  WBC 5.7 4.3 5.5  NEUTROABS  --  2.0 2.8  HGB 12.2* 11.2* 11.1*  HCT 37.3* 35.4* 34.4*  MCV 84.6 85.7 84.5  PLT 95* 66* 73*    Basic Metabolic Panel: Recent Labs  Lab 08/18/21 2200 08/20/21 0415 08/21/21 0359  NA 140 138 137  K 3.7 3.8 3.7  CL 105 104 106  CO2 '28 25 24  '$ GLUCOSE 104* 91 114*  BUN '9 16 16  '$ CREATININE 0.87 0.75 0.72  CALCIUM 8.9 9.3 9.1  MG  --  2.0 2.0    GFR: Estimated Creatinine Clearance: 99.8 mL/min (by C-G formula based on SCr of 0.72 mg/dL). Liver Function Tests: Recent Labs  Lab 08/18/21 2200 08/20/21 0415 08/21/21 0359  AST 61* 121* 138*  ALT 28 42 56*  ALKPHOS 118 120 151*  BILITOT 0.5 1.3* 0.6  PROT 8.4* 7.5 7.1  ALBUMIN 4.5 3.7 3.7    No results for input(s): LIPASE, AMYLASE in the last 168 hours. No results for input(s): AMMONIA in the last 168 hours. Coagulation Profile: No results for input(s): INR, PROTIME in the last 168 hours. Cardiac Enzymes: No results for input(s): CKTOTAL, CKMB, CKMBINDEX, TROPONINI in the last 168 hours. BNP (last 3 results) No results for input(s): PROBNP in the last 8760 hours. HbA1C: No results for input(s): HGBA1C in the last 72 hours. CBG: No results for input(s): GLUCAP in the last 168 hours. Lipid Profile: No results for input(s): CHOL, HDL, LDLCALC, TRIG, CHOLHDL, LDLDIRECT in the last 72 hours. Thyroid Function Tests: No results for input(s): TSH, T4TOTAL, FREET4, T3FREE, THYROIDAB in the last 72 hours. Anemia Panel: No results for input(s): VITAMINB12, FOLATE, FERRITIN, TIBC, IRON, RETICCTPCT in the last 72 hours. Sepsis Labs: No results for input(s): PROCALCITON, LATICACIDVEN in the last 168 hours.  No results found for this or any previous visit (from the past 240 hour(s)).       Radiology Studies: No results found.      Scheduled Meds:  feeding supplement  237 mL Oral TID BM   folic acid  1 mg  Oral Daily   LORazepam  0-4 mg Oral Q6H   Followed by   LORazepam  0-4 mg Oral Q12H   multivitamin with minerals  1 tablet Oral Daily   senna-docusate  1 tablet Oral BID   thiamine  100 mg Oral Daily   Or   thiamine  100 mg Intravenous Daily   Continuous Infusions:  lactated ringers 100 mL/hr at 08/21/21 3845          Aline August, MD [facility] 08/21/2021, 8:26 AM

## 2021-08-21 NOTE — Plan of Care (Signed)
  Problem: Education: Goal: Knowledge of General Education information will improve Description Including pain rating scale, medication(s)/side effects and non-pharmacologic comfort measures Outcome: Progressing   

## 2021-08-22 LAB — COMPREHENSIVE METABOLIC PANEL
ALT: 56 U/L — ABNORMAL HIGH (ref 0–44)
AST: 90 U/L — ABNORMAL HIGH (ref 15–41)
Albumin: 3.8 g/dL (ref 3.5–5.0)
Alkaline Phosphatase: 137 U/L — ABNORMAL HIGH (ref 38–126)
Anion gap: 8 (ref 5–15)
BUN: 16 mg/dL (ref 6–20)
CO2: 24 mmol/L (ref 22–32)
Calcium: 9.4 mg/dL (ref 8.9–10.3)
Chloride: 104 mmol/L (ref 98–111)
Creatinine, Ser: 0.8 mg/dL (ref 0.61–1.24)
GFR, Estimated: >60 mL/min (ref >60)
Glucose, Bld: 109 mg/dL — ABNORMAL HIGH (ref 70–99)
Potassium: 3.9 mmol/L (ref 3.5–5.1)
Sodium: 136 mmol/L (ref 135–145)
Total Bilirubin: 0.6 mg/dL (ref 0.3–1.2)
Total Protein: 7.6 g/dL (ref 6.5–8.1)

## 2021-08-22 LAB — CBC WITH DIFFERENTIAL/PLATELET
Abs Immature Granulocytes: 0.02 10*3/uL (ref 0.00–0.07)
Basophils Absolute: 0 10*3/uL (ref 0.0–0.1)
Basophils Relative: 1 %
Eosinophils Absolute: 0.1 10*3/uL (ref 0.0–0.5)
Eosinophils Relative: 2 %
HCT: 37.3 % — ABNORMAL LOW (ref 39.0–52.0)
Hemoglobin: 11.8 g/dL — ABNORMAL LOW (ref 13.0–17.0)
Immature Granulocytes: 0 %
Lymphocytes Relative: 32 %
Lymphs Abs: 2 10*3/uL (ref 0.7–4.0)
MCH: 27.1 pg (ref 26.0–34.0)
MCHC: 31.6 g/dL (ref 30.0–36.0)
MCV: 85.7 fL (ref 80.0–100.0)
Monocytes Absolute: 1 10*3/uL (ref 0.1–1.0)
Monocytes Relative: 16 %
Neutro Abs: 3 10*3/uL (ref 1.7–7.7)
Neutrophils Relative %: 49 %
Platelets: 107 10*3/uL — ABNORMAL LOW (ref 150–400)
RBC: 4.35 MIL/uL (ref 4.22–5.81)
RDW: 18.7 % — ABNORMAL HIGH (ref 11.5–15.5)
WBC: 6.1 10*3/uL (ref 4.0–10.5)
nRBC: 0 % (ref 0.0–0.2)

## 2021-08-22 LAB — MAGNESIUM: Magnesium: 2.2 mg/dL (ref 1.7–2.4)

## 2021-08-22 MED ORDER — ADULT MULTIVITAMIN W/MINERALS CH: 1.0000 | ORAL_TABLET | Freq: Every day | ORAL | Status: DC

## 2021-08-22 MED ORDER — AMLODIPINE BESYLATE 5 MG PO TABS: 5.0000 mg | ORAL_TABLET | Freq: Every day | ORAL | 0 refills | Status: DC

## 2021-08-22 MED ORDER — CHLORDIAZEPOXIDE HCL 10 MG PO CAPS: ORAL_CAPSULE | ORAL | 0 refills | Status: DC

## 2021-08-22 MED ORDER — FOLIC ACID 1 MG PO TABS: 1.0000 mg | ORAL_TABLET | Freq: Every day | ORAL | 0 refills | Status: DC

## 2021-08-22 MED ORDER — THIAMINE HCL 100 MG PO TABS: 100.0000 mg | ORAL_TABLET | Freq: Every day | ORAL | 0 refills | Status: DC

## 2021-08-22 NOTE — TOC Progression Note (Signed)
Transition of Care [facility]) - Progression Note    Patient Details  Name: Barry Moore MRN: [id number] Date of Birth: January 24, 1966  Transition of Care [facility]) CM/SW Contact  Purcell Mouton, RN Phone Number: 08/22/2021, 12:09 PM  Clinical Narrative:    Pt asked for a bus pass, states that he has no money. Bus Pass given to [employer].    Expected Discharge Plan: Psychiatric Hospital Barriers to Discharge: Continued Medical Work up  Expected Discharge Plan and Services Expected Discharge Plan: Psychiatric Hospital In-house Referral: Clinical Social Work Discharge Planning Services: CM Consult   Living arrangements for the past 2 months: Homeless Expected Discharge Date: 08/22/21                                     Social Determinants of Health (SDOH) Interventions    Readmission Risk Interventions    07/20/2019   11:30 AM 01/03/2019    2:37 PM  Readmission Risk Prevention Plan  Transportation Screening Complete Complete  Medication Review (RN Care Manager) Complete Complete  PCP or Specialist appointment within 3-5 days of discharge  Not Complete  PCP/Specialist Appt Not Complete comments  Got first available appointments  [place] or [place]  Not Complete  SW Recovery Care/Counseling Consult Complete Not Complete  Palliative Care Screening Not Applicable Not [place] Not Applicable Not Applicable

## 2021-08-22 NOTE — Discharge Summary (Addendum)
Physician Discharge Summary  Barry Moore FAO:[id number] DOB: 03-20-1966 DOA: 08/18/2021  PCP: Merryl Hacker, No  Admit date: 08/18/2021 Discharge date: 08/22/2021  Admitted From: Home Disposition: Home  Recommendations for Outpatient Follow-up:  Follow up with PCP in 1 week with repeat CBC/CMP Outpatient follow-up with psychiatry Abstain from alcohol Follow up in ED if symptoms worsen or new appear   Home Health: No Equipment/Devices: None  Discharge Condition: Stable CODE STATUS: Full Diet recommendation: Heart healthy  Brief/Interim Summary:  56 y.o. male with medical history significant of alcohol abuse, possible alcohol withdrawal seizures, depression, alcoholic hepatitis, pancreatitis, hypertension, homelessness, thrombocytopenia, GI bleeding presented with worsening anxiety and depression along with suicidal ideation and auditory hallucinations due to homelessness and ongoing alcohol use.  On presentation, alcohol  level was 421.  He was started on CIWA protocol. Patient was evaluated by psychiatry and initially the plan was for the patient to be admitted to [facility] but because of history of alcohol withdrawal seizures, it was recommended that patient be admitted to medical service first.  During the hospitalization, patient's condition has gradually improved.  He denies any more suicidal ideation and psych has cleared him for discharge.  His withdrawal has much improved.  He will be discharged today on few days of tapering Librium.  Outpatient follow-up with PCP and psychiatry.  Discharge Diagnoses:   Alcohol abuse with intoxication Concern for alcohol withdrawal History of alcohol withdrawal seizure -Patient presented with alcohol level of 421.  There has been no evidence of any witnessed seizures but because of alcohol withdrawal seizure history, psychiatry recommended that patient be treated for alcohol withdrawal under medical service. -Treated with CIWA protocol along with thiamine,  multivitamin and folic acid. -TOC has provided him with resources for outpatient follow-up regarding help with alcohol abuse problem -No seizures during this hospitalization. -Withdrawal has much improved.  Discharge him home on few days of oral tapering Librium.  Continue thiamine, multivitamin and folic acid on discharge.  Outpatient follow-up with PCP.  Patient needs to be compliant with medications and follow-up.   Suicidal ideation Severe depression -Psych evaluation appreciated.  Apparently, patient is no longer having suicidal ideations.  Psych recommends no inpatient psychiatric hospitalization.  Outpatient follow-up with psychiatry.   Elevated LFTs -Possibly from alcohol abuse.  Improving.  Outpatient follow-up.  Thrombocytopenia -Chronic.  From alcohol abuse.  No signs of bleeding.  Improving.  Outpatient follow-up  Hypertension -Blood pressure on the high side.  Possibly from withdrawal.  Does not take any antihypertensives at home.  Amlodipine started during this hospitalization.  Continue on discharge.  Outpatient follow-up   Homelessness -TOC had spoken to him about the same.  Normocytic anemia -From chronic illnesses and alcohol abuse.  Hemoglobin stable.  Outpatient follow-up.  Moderate malnutrition -follow nutrition recommendations  Discharge Instructions  Discharge Instructions     Ambulatory referral to Psychiatry   Complete by: As directed    Severe depression   Diet - low sodium heart healthy   Complete by: As directed    Increase activity slowly   Complete by: As directed       Allergies as of 08/22/2021       Reactions   Tomato Shortness Of Breath, Nausea And Vomiting   Aspirin Nausea And Vomiting   Sulfa Antibiotics Other (See Comments)   "Does not make me feel good"        Medication List     STOP taking these medications    gabapentin 100 MG capsule Commonly known  as: NEURONTIN   sertraline 50 MG tablet Commonly known as: ZOLOFT    traZODone 100 MG tablet Commonly known as: DESYREL       TAKE these medications    amLODipine 5 MG tablet Commonly known as: NORVASC Take 1 tablet (5 mg total) by mouth daily.   chlordiazePOXIDE 10 MG capsule Commonly known as: LIBRIUM 10 mg 3 times a day for 2 days then 2 times a day for 2 days then daily for 2 days then discontinue   folic acid 1 MG tablet Commonly known as: FOLVITE Take 1 tablet (1 mg total) by mouth daily.   multivitamin with minerals Tabs tablet Take 1 tablet by mouth daily.   propranolol 10 MG tablet Commonly known as: INDERAL Take 10 mg by mouth 2 (two) times daily.   thiamine 100 MG tablet Take 1 tablet (100 mg total) by mouth daily.   topiramate 25 MG tablet Commonly known as: TOPAMAX Take 25 mg by mouth 2 (two) times daily.   venlafaxine 75 MG tablet Commonly known as: EFFEXOR Take 75 mg by mouth 2 (two) times daily.        [facility].   Specialty: Urgent Care Contact information: [place] [phone]        [facility].   Specialty: [facility] information: [address] [place] [address] [phone]         [facility].   Contact information: [place] [phone]               Allergies  Allergen Reactions   Tomato Shortness Of Breath and Nausea And Vomiting   Aspirin Nausea And Vomiting   Sulfa Antibiotics Other (See Comments)    "Does not make me feel good"    Consultations: None   Procedures/Studies: No results found.    Subjective: Patient seen and examined at bedside.  Feels much better and wants to be discharged today.  Shakes have much improved.  No overnight fever or vomiting reported. Discharge Exam: Vitals:   08/21/21 1852 08/22/21 0445  BP: (!) 144/79 (!) 143/83  Pulse: 93 78   Resp: 18 18  Temp: 98.6 F (37 C) 98.1 F (36.7 C)  SpO2: 98% 97%    General: Pt is alert, awake, not in acute distress.  Looks chronically ill and deconditioned.  Currently on room air. Cardiovascular: rate controlled, S1/S2 + Respiratory: bilateral decreased breath sounds at bases Abdominal: Soft, NT, ND, bowel sounds + Extremities: no edema, no cyanosis    The results of significant diagnostics from this hospitalization (including imaging, microbiology, ancillary and laboratory) are listed below for reference.     Microbiology: No results found for this or any previous visit (from the past 240 hour(s)).   Labs: BNP (last 3 results) No results for input(s): BNP in the last 8760 hours. Basic Metabolic Panel: Recent Labs  Lab 08/18/21 2200 08/20/21 0415 08/21/21 0359 08/22/21 0443  NA 140 138 137 136  K 3.7 3.8 3.7 3.9  CL 105 104 106 104  CO2 '28 25 24 24  '$ GLUCOSE 104* 91 114* 109*  BUN '9 16 16 16  '$ CREATININE 0.87 0.75 0.72 0.80  CALCIUM 8.9 9.3 9.1 9.4  MG  --  2.0 2.0 2.2   Liver Function Tests: Recent Labs  Lab 08/18/21 2200 08/20/21 0415 08/21/21 0359 08/22/21  0443  AST 61* 121* 138* 90*  ALT 28 42 56* 56*  ALKPHOS 118 120 151* 137*  BILITOT 0.5 1.3* 0.6 0.6  PROT 8.4* 7.5 7.1 7.6  ALBUMIN 4.5 3.7 3.7 3.8   No results for input(s): LIPASE, AMYLASE in the last 168 hours. No results for input(s): AMMONIA in the last 168 hours. CBC: Recent Labs  Lab 08/18/21 2200 08/20/21 0415 08/21/21 0359 08/22/21 0443  WBC 5.7 4.3 5.5 6.1  NEUTROABS  --  2.0 2.8 3.0  HGB 12.2* 11.2* 11.1* 11.8*  HCT 37.3* 35.4* 34.4* 37.3*  MCV 84.6 85.7 84.5 85.7  PLT 95* 66* 73* 107*   Cardiac Enzymes: No results for input(s): CKTOTAL, CKMB, CKMBINDEX, TROPONINI in the last 168 hours. BNP: Invalid input(s): POCBNP CBG: No results for input(s): GLUCAP in the last 168 hours. D-Dimer No results for input(s): DDIMER in the last 72 hours. Hgb A1c No results for  input(s): HGBA1C in the last 72 hours. Lipid Profile No results for input(s): CHOL, HDL, LDLCALC, TRIG, CHOLHDL, LDLDIRECT in the last 72 hours. Thyroid function studies No results for input(s): TSH, T4TOTAL, T3FREE, THYROIDAB in the last 72 hours.  Invalid input(s): FREET3 Anemia work up No results for input(s): VITAMINB12, FOLATE, FERRITIN, TIBC, IRON, RETICCTPCT in the last 72 hours. Urinalysis    Component Value Date/Time   COLORURINE YELLOW 07/15/2021 2013   APPEARANCEUR CLEAR 07/15/2021 2013   LABSPEC 1.011 07/15/2021 2013   PHURINE 6.0 07/15/2021 2013   GLUCOSEU NEGATIVE 07/15/2021 2013   HGBUR NEGATIVE 07/15/2021 2013   [place] NEGATIVE 07/15/2021 2013   [place] NEGATIVE 07/15/2021 2013   PROTEINUR NEGATIVE 07/15/2021 2013   NITRITE NEGATIVE 07/15/2021 2013   LEUKOCYTESUR NEGATIVE 07/15/2021 2013   Sepsis Labs Invalid input(s): PROCALCITONIN,  WBC,  LACTICIDVEN Microbiology No results found for this or any previous visit (from the past 240 hour(s)).   Time coordinating discharge: 35 minutes  SIGNED:   Aline August, MD  [facility] 08/22/2021, 10:02 AM

## 2021-08-25 ENCOUNTER — Other Ambulatory Visit: Payer: Self-pay

## 2021-08-25 ENCOUNTER — Emergency Department (HOSPITAL_COMMUNITY)
Admission: EM | Admit: 2021-08-25 | Discharge: 2021-08-26 | Disposition: A | Discharge status: Home / Self Care | Payer: Medicaid - Out of State | Attending: Emergency Medicine | Admitting: Emergency Medicine

## 2021-08-25 ENCOUNTER — Encounter (HOSPITAL_COMMUNITY): Payer: Self-pay | Admitting: Emergency Medicine

## 2021-08-25 DIAGNOSIS — F1090 Alcohol use, unspecified, uncomplicated: Secondary | ICD-10-CM | POA: Insufficient documentation

## 2021-08-25 DIAGNOSIS — Z20822 Contact with and (suspected) exposure to covid-19: Secondary | ICD-10-CM | POA: Insufficient documentation

## 2021-08-25 DIAGNOSIS — Z79899 Other long term (current) drug therapy: Secondary | ICD-10-CM | POA: Diagnosis not present

## 2021-08-25 DIAGNOSIS — R45851 Suicidal ideations: Secondary | ICD-10-CM | POA: Insufficient documentation

## 2021-08-25 DIAGNOSIS — Y908 Blood alcohol level of 240 mg/100 ml or more: Secondary | ICD-10-CM | POA: Insufficient documentation

## 2021-08-25 DIAGNOSIS — Z789 Other specified health status: Secondary | ICD-10-CM

## 2021-08-25 DIAGNOSIS — I10 Essential (primary) hypertension: Secondary | ICD-10-CM | POA: Insufficient documentation

## 2021-08-25 LAB — COMPREHENSIVE METABOLIC PANEL
ALT: 48 U/L — ABNORMAL HIGH (ref 0–44)
AST: 54 U/L — ABNORMAL HIGH (ref 15–41)
Albumin: 4 g/dL (ref 3.5–5.0)
Alkaline Phosphatase: 137 U/L — ABNORMAL HIGH (ref 38–126)
Anion gap: 11 (ref 5–15)
BUN: 10 mg/dL (ref 6–20)
CO2: 21 mmol/L — ABNORMAL LOW (ref 22–32)
Calcium: 8.7 mg/dL — ABNORMAL LOW (ref 8.9–10.3)
Chloride: 109 mmol/L (ref 98–111)
Creatinine, Ser: 0.84 mg/dL (ref 0.61–1.24)
GFR, Estimated: >60 mL/min (ref >60)
Glucose, Bld: 106 mg/dL — ABNORMAL HIGH (ref 70–99)
Potassium: 3.7 mmol/L (ref 3.5–5.1)
Sodium: 141 mmol/L (ref 135–145)
Total Bilirubin: 0.4 mg/dL (ref 0.3–1.2)
Total Protein: 7.7 g/dL (ref 6.5–8.1)

## 2021-08-25 LAB — CBC WITH DIFFERENTIAL/PLATELET
Abs Immature Granulocytes: 0.01 10*3/uL (ref 0.00–0.07)
Basophils Absolute: 0.1 10*3/uL (ref 0.0–0.1)
Basophils Relative: 2 %
Eosinophils Absolute: 0.2 10*3/uL (ref 0.0–0.5)
Eosinophils Relative: 3 %
HCT: 33.6 % — ABNORMAL LOW (ref 39.0–52.0)
Hemoglobin: 10.9 g/dL — ABNORMAL LOW (ref 13.0–17.0)
Immature Granulocytes: 0 %
Lymphocytes Relative: 49 %
Lymphs Abs: 2.6 10*3/uL (ref 0.7–4.0)
MCH: 27.5 pg (ref 26.0–34.0)
MCHC: 32.4 g/dL (ref 30.0–36.0)
MCV: 84.8 fL (ref 80.0–100.0)
Monocytes Absolute: 0.8 10*3/uL (ref 0.1–1.0)
Monocytes Relative: 16 %
Neutro Abs: 1.5 10*3/uL — ABNORMAL LOW (ref 1.7–7.7)
Neutrophils Relative %: 30 %
Platelets: 201 10*3/uL (ref 150–400)
RBC: 3.96 MIL/uL — ABNORMAL LOW (ref 4.22–5.81)
RDW: 18.8 % — ABNORMAL HIGH (ref 11.5–15.5)
WBC: 5.1 10*3/uL (ref 4.0–10.5)
nRBC: 0 % (ref 0.0–0.2)

## 2021-08-25 LAB — ACETAMINOPHEN LEVEL: Acetaminophen (Tylenol), Serum: <10 ug/mL — ABNORMAL LOW (ref 10–30)

## 2021-08-25 LAB — RAPID URINE DRUG SCREEN, HOSP PERFORMED
Amphetamines: NOT DETECTED
Barbiturates: NOT DETECTED
Benzodiazepines: NOT DETECTED
Cocaine: NOT DETECTED
Opiates: NOT DETECTED
Tetrahydrocannabinol: NOT DETECTED

## 2021-08-25 LAB — SALICYLATE LEVEL: Salicylate Lvl: <7 mg/dL — ABNORMAL LOW (ref 7.0–30.0)

## 2021-08-25 LAB — ETHANOL: Alcohol, Ethyl (B): 382 mg/dL (ref <10)

## 2021-08-25 NOTE — ED Provider Triage Note (Signed)
Emergency Medicine Provider Triage Evaluation Note  Barry Moore , a 56 y.o. male  was evaluated in triage.  Pt complains of syncopal episode following heavy drinking.  Episode occurred just prior to arrival.  He states he does not remember passing out however he suspects he passed out because once he woke up he felt awful.  Otherwise denies any complaints.  Review of Systems  Positive: As above Negative: As above  Physical Exam  BP (!) 156/87 (BP Location: Right Arm)   Pulse (!) 101   Temp 98.7 F (37.1 C) (Oral)   Resp 17   SpO2 94%  Gen:   Awake, no distress   Resp:  Normal effort  MSK:   Moves extremities without difficulty  Other:    Medical Decision Making  Medically screening exam initiated at 10:37 PM.  Appropriate orders placed.  Edrian Melucci was informed that the remainder of the evaluation will be completed by another provider, this initial triage assessment does not replace that evaluation, and the importance of remaining in the ED until their evaluation is complete.    Evlyn Courier, PA-C 08/25/21 2238

## 2021-08-25 NOTE — ED Triage Notes (Signed)
Patient arrived with EMS from street intoxicated with ETOH , reports suicidal ideation and seizures today .

## 2021-08-26 LAB — RESP PANEL BY RT-PCR (FLU A&B, COVID) ARPGX2
Influenza A by PCR: NEGATIVE
Influenza B by PCR: NEGATIVE
SARS Coronavirus 2 by RT PCR: NEGATIVE

## 2021-08-26 LAB — VITAMIN B1: Vitamin B1 (Thiamine): 135.1 nmol/L (ref 66.5–200.0)

## 2021-08-26 MED ORDER — THIAMINE HCL 100 MG PO TABS
100.0000 mg | ORAL_TABLET | Freq: Every day | ORAL | Status: DC
  Administered 2021-08-26: 100 mg via ORAL
  Filled 2021-08-26: qty 1

## 2021-08-26 MED ORDER — LORAZEPAM 2 MG/ML IJ SOLN: 0.0000 mg | Freq: Two times a day (BID) | INTRAMUSCULAR | Status: DC

## 2021-08-26 MED ORDER — LORAZEPAM 2 MG/ML IJ SOLN: 0.0000 mg | Freq: Four times a day (QID) | INTRAMUSCULAR | Status: DC

## 2021-08-26 MED ORDER — LORAZEPAM 1 MG PO TABS: 0.0000 mg | ORAL_TABLET | Freq: Two times a day (BID) | ORAL | Status: DC

## 2021-08-26 MED ORDER — THIAMINE HCL 100 MG/ML IJ SOLN: 100.0000 mg | Freq: Every day | INTRAMUSCULAR | Status: DC

## 2021-08-26 MED ORDER — LORAZEPAM 1 MG PO TABS: 0.0000 mg | ORAL_TABLET | Freq: Four times a day (QID) | ORAL | Status: DC

## 2021-08-26 NOTE — ED Provider Notes (Signed)
[facility] EMERGENCY DEPARTMENT Provider Note   CSN: [id number] Arrival date & time: 08/25/21  2223     History  Chief Complaint  Patient presents with   Alcohol Intoxication     Suicidal    Barry Moore is a 56 y.o. male with a history of EtOH abuse, cocaine abuse, HTN, prior GI bleed, and depression who presents to the ED with complaints of suicidal ideation. Patient reports SI for months, progressively worsening, states it is all he thinks about anymore.  States he is been try to drink himself to death.  States he drinks 4-5 40s per day, last drink was just prior to arrival.  He has a history of alcohol withdrawal.  He denies vomiting, chest pain, shortness of breath, hallucinations, recent seizure activity, or abdominal pain.  Denies homicidal ideation or hallucinations.  Denies drug use.    HPI     Home Medications Prior to Admission medications   Medication Sig Start Date End Date Taking? Authorizing Provider  amLODipine (NORVASC) 5 MG tablet Take 1 tablet (5 mg total) by mouth daily. 08/22/21   Aline August, MD  chlordiazePOXIDE (LIBRIUM) 10 MG capsule 10 mg 3 times a day for 2 days then 2 times a day for 2 days then daily for 2 days then discontinue 08/22/21   Aline August, MD  folic acid (FOLVITE) 1 MG tablet Take 1 tablet (1 mg total) by mouth daily. 08/22/21   Aline August, MD  Multiple Vitamin (MULTIVITAMIN WITH MINERALS) TABS tablet Take 1 tablet by mouth daily. 08/22/21   Aline August, MD  propranolol (INDERAL) 10 MG tablet Take 10 mg by mouth 2 (two) times daily.    [provider]  thiamine 100 MG tablet Take 1 tablet (100 mg total) by mouth daily. 08/22/21   Aline August, MD  topiramate (TOPAMAX) 25 MG tablet Take 25 mg by mouth 2 (two) times daily.    [provider]  venlafaxine (EFFEXOR) 75 MG tablet Take 75 mg by mouth 2 (two) times daily. 05/15/21   [provider]  ferrous sulfate 325 (65 FE) MG tablet Take 1  tablet (325 mg total) by mouth daily with breakfast. Patient not taking: Reported on 09/23/2018 08/09/18 10/09/18  Elodia Florence., MD  pantoprazole (PROTONIX) 40 MG tablet Take 1 tablet (40 mg total) by mouth 2 (two) times daily before a meal. Patient not taking: Reported on 08/14/2020 06/27/20 08/14/20  Charlynne Cousins, MD      Allergies    Tomato, Aspirin, and Sulfa antibiotics    Review of Systems   Review of Systems  Constitutional:  Negative for chills and fever.  Respiratory:  Negative for shortness of breath.   Cardiovascular:  Negative for chest pain.  Gastrointestinal:  Negative for abdominal pain, nausea and vomiting.  Neurological:  Negative for syncope.  Psychiatric/Behavioral:  Positive for suicidal ideas.   All other systems reviewed and are negative.  Physical Exam Updated Vital Signs BP 127/78 (BP Location: Right Arm)   Pulse 86   Temp 98.7 F (37.1 C) (Oral)   Resp 18   SpO2 98%  Physical Exam Vitals and nursing note reviewed.  Constitutional:      General: He is not in acute distress.    Appearance: He is well-developed. He is not toxic-appearing.  HENT:     Head: Normocephalic and atraumatic.  Eyes:     General:        Right eye: No discharge.  Left eye: No discharge.     Conjunctiva/sclera: Conjunctivae normal.  Cardiovascular:     Rate and Rhythm: Normal rate and regular rhythm.  Pulmonary:     Effort: No respiratory distress.     Breath sounds: Normal breath sounds. No wheezing or rales.  Abdominal:     General: There is no distension.     Palpations: Abdomen is soft.     Tenderness: There is no abdominal tenderness.  Musculoskeletal:     Cervical back: Neck supple.  Skin:    General: Skin is warm and dry.  Neurological:     Mental Status: He is alert.     Comments: Clear speech.   Psychiatric:        Mood and Affect: Affect is flat.        Behavior: Behavior is cooperative.        Thought Content: Thought content includes  suicidal ideation.    ED Results / Procedures / Treatments   Labs (all labs ordered are listed, but only abnormal results are displayed) Labs Reviewed  CBC WITH DIFFERENTIAL/PLATELET - Abnormal; Notable for the following components:      Result Value   RBC 3.96 (*)    Hemoglobin 10.9 (*)    HCT 33.6 (*)    RDW 18.8 (*)    Neutro Abs 1.5 (*)    All other components within normal limits  ETHANOL - Abnormal; Notable for the following components:   Alcohol, Ethyl (B) 382 (*)    All other components within normal limits  SALICYLATE LEVEL - Abnormal; Notable for the following components:   Salicylate Lvl <7.0 (*)    All other components within normal limits  ACETAMINOPHEN LEVEL - Abnormal; Notable for the following components:   Acetaminophen (Tylenol), Serum <10 (*)    All other components within normal limits  COMPREHENSIVE METABOLIC PANEL - Abnormal; Notable for the following components:   CO2 21 (*)    Glucose, Bld 106 (*)    Calcium 8.7 (*)    AST 54 (*)    ALT 48 (*)    Alkaline Phosphatase 137 (*)    All other components within normal limits  RAPID URINE DRUG SCREEN, HOSP PERFORMED    EKG None  Radiology No results found.  Procedures Procedures    Medications Ordered in ED Medications - No data to display  ED Course/ Medical Decision Making/ A&P                           Medical Decision Making Risk OTC drugs. Prescription drug management.  Patient presents to the ED f with suicidal ideation and alcohol intoxication.  Nontoxic, resting comfortably, vitals that significant abnormality  Additional history obtained:  Additional history obtained from chart review & nursing note review.   Lab Tests:  I have viewed & interpreted screening labs including CBC, CMP, acetaminophen/salicylate/ethanol level, UDS: Patient notably intoxicated with ethanol level of 382.  His transaminitis and anemia are fairly similar to prior.  ED Course:  Patient with history of  alcohol withdrawal, currently doing well, does not appear to require admission for alcohol withdrawal at this time, he will be placed on CIWA protocol and monitored closely.  Patient is medically cleared. Consult placed to TTS. Disposition per [facility].   The patient has been placed in psychiatric observation due to the need to provide a safe environment for the patient while obtaining psychiatric consultation and evaluation, as well as ongoing  medical and medication management to treat the patient's condition.  The patient has not been placed under full IVC at this time.  Discussed w/ attending Dr. Ralene Bathe.   Portions of this note were generated with [employer]. Dictation errors may occur despite best attempts at proofreading.  Final Clinical Impression(s) / ED Diagnoses Final diagnoses:  Suicidal ideation  Alcohol use    Rx / DC Orders ED Discharge Orders     None         Leafy Kindle 08/26/21 8719    Quintella Reichert, MD 08/26/21 1907
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
## 2021-08-26 NOTE — BH Assessment (Signed)
TTS spoke to [facility], to put pt in a private room to complete TTS assessment.   RN will message clinician, when she is ready.

## 2021-09-05 ENCOUNTER — Emergency Department (HOSPITAL_COMMUNITY): Payer: Medicaid - Out of State

## 2021-09-05 ENCOUNTER — Other Ambulatory Visit: Payer: Self-pay

## 2021-09-05 ENCOUNTER — Emergency Department (HOSPITAL_COMMUNITY)
Admission: EM | Admit: 2021-09-05 | Discharge: 2021-09-05 | Disposition: A | Discharge status: Home / Self Care | Payer: Medicaid - Out of State | Attending: Emergency Medicine | Admitting: Emergency Medicine

## 2021-09-05 DIAGNOSIS — F10120 Alcohol abuse with intoxication, uncomplicated: Secondary | ICD-10-CM | POA: Diagnosis not present

## 2021-09-05 DIAGNOSIS — R5383 Other fatigue: Secondary | ICD-10-CM | POA: Insufficient documentation

## 2021-09-05 DIAGNOSIS — I10 Essential (primary) hypertension: Secondary | ICD-10-CM | POA: Insufficient documentation

## 2021-09-05 DIAGNOSIS — R55 Syncope and collapse: Secondary | ICD-10-CM | POA: Insufficient documentation

## 2021-09-05 DIAGNOSIS — E860 Dehydration: Secondary | ICD-10-CM | POA: Diagnosis not present

## 2021-09-05 DIAGNOSIS — F10920 Alcohol use, unspecified with intoxication, uncomplicated: Secondary | ICD-10-CM

## 2021-09-05 LAB — CBC WITH DIFFERENTIAL/PLATELET
Abs Immature Granulocytes: 0.01 10*3/uL (ref 0.00–0.07)
Basophils Absolute: 0.1 10*3/uL (ref 0.0–0.1)
Basophils Relative: 1 %
Eosinophils Absolute: 0.1 10*3/uL (ref 0.0–0.5)
Eosinophils Relative: 3 %
HCT: 38.8 % — ABNORMAL LOW (ref 39.0–52.0)
Hemoglobin: 12.4 g/dL — ABNORMAL LOW (ref 13.0–17.0)
Immature Granulocytes: 0 %
Lymphocytes Relative: 42 %
Lymphs Abs: 2 10*3/uL (ref 0.7–4.0)
MCH: 27 pg (ref 26.0–34.0)
MCHC: 32 g/dL (ref 30.0–36.0)
MCV: 84.3 fL (ref 80.0–100.0)
Monocytes Absolute: 0.5 10*3/uL (ref 0.1–1.0)
Monocytes Relative: 10 %
Neutro Abs: 2.1 10*3/uL (ref 1.7–7.7)
Neutrophils Relative %: 44 %
Platelets: 87 10*3/uL — ABNORMAL LOW (ref 150–400)
RBC: 4.6 MIL/uL (ref 4.22–5.81)
RDW: 18.5 % — ABNORMAL HIGH (ref 11.5–15.5)
WBC: 4.7 10*3/uL (ref 4.0–10.5)
nRBC: 0 % (ref 0.0–0.2)

## 2021-09-05 LAB — COMPREHENSIVE METABOLIC PANEL
ALT: 43 U/L (ref 0–44)
AST: 92 U/L — ABNORMAL HIGH (ref 15–41)
Albumin: 4.1 g/dL (ref 3.5–5.0)
Alkaline Phosphatase: 136 U/L — ABNORMAL HIGH (ref 38–126)
Anion gap: 9 (ref 5–15)
BUN: 7 mg/dL (ref 6–20)
CO2: 25 mmol/L (ref 22–32)
Calcium: 9.2 mg/dL (ref 8.9–10.3)
Chloride: 105 mmol/L (ref 98–111)
Creatinine, Ser: 0.8 mg/dL (ref 0.61–1.24)
GFR, Estimated: >60 mL/min (ref >60)
Glucose, Bld: 128 mg/dL — ABNORMAL HIGH (ref 70–99)
Potassium: 3.6 mmol/L (ref 3.5–5.1)
Sodium: 139 mmol/L (ref 135–145)
Total Bilirubin: 0.7 mg/dL (ref 0.3–1.2)
Total Protein: 8.2 g/dL — ABNORMAL HIGH (ref 6.5–8.1)

## 2021-09-05 LAB — URINALYSIS, ROUTINE W REFLEX MICROSCOPIC
Bilirubin Urine: NEGATIVE
Glucose, UA: NEGATIVE mg/dL
Hgb urine dipstick: NEGATIVE
Ketones, ur: NEGATIVE mg/dL
Leukocytes,Ua: NEGATIVE
Nitrite: NEGATIVE
Protein, ur: NEGATIVE mg/dL
Specific Gravity, Urine: 1.01 (ref 1.005–1.030)
pH: 5 (ref 5.0–8.0)

## 2021-09-05 MED ORDER — ONDANSETRON HCL 4 MG/2ML IJ SOLN
4.0000 mg | Freq: Once | INTRAMUSCULAR | Status: AC
  Administered 2021-09-05: 4 mg via INTRAVENOUS
  Filled 2021-09-05: qty 2

## 2021-09-05 MED ORDER — SODIUM CHLORIDE 0.9 % IV BOLUS
1000.0000 mL | Freq: Once | INTRAVENOUS | Status: AC
  Administered 2021-09-05: 1000 mL via INTRAVENOUS

## 2021-09-05 NOTE — ED Provider Notes (Incomplete)
[facility] DEPT Provider Note   CSN: [id number] Arrival date & time: 09/05/21  1216     History {Add pertinent medical, surgical, social history, OB history to HPI:1} Chief Complaint  Patient presents with   Fatigue    Barry Moore is a 56 y.o. male.  HPI     Home Medications Prior to Admission medications   Medication Sig Start Date End Date Taking? Authorizing Provider  acetaminophen (TYLENOL) 500 MG tablet Take 1,000 mg by mouth See admin instructions. Take 1000 mg by mouth 2-3 times a day as needed for headache    [provider]  amLODipine (NORVASC) 5 MG tablet Take 1 tablet (5 mg total) by mouth daily. Patient not taking: Reported on 08/26/2021 08/22/21   Aline August, MD  chlordiazePOXIDE (LIBRIUM) 10 MG capsule 10 mg 3 times a day for 2 days then 2 times a day for 2 days then daily for 2 days then discontinue Patient not taking: Reported on 08/26/2021 08/22/21   Aline August, MD  folic acid (FOLVITE) 1 MG tablet Take 1 tablet (1 mg total) by mouth daily. Patient not taking: Reported on 08/26/2021 08/22/21   Aline August, MD  ibuprofen (ADVIL) 200 MG tablet Take 400 mg by mouth 2 (two) times daily as needed for headache.    [provider]  lidocaine (LIDODERM) 5 % 1 patch daily. Patient not taking: Reported on 08/26/2021 05/21/21   [provider]  Multiple Vitamin (MULTIVITAMIN WITH MINERALS) TABS tablet Take 1 tablet by mouth daily. Patient not taking: Reported on 08/26/2021 08/22/21   Aline August, MD  propranolol (INDERAL) 10 MG tablet Take 10 mg by mouth 2 (two) times daily. Patient not taking: Reported on 08/26/2021    [provider]  thiamine 100 MG tablet Take 1 tablet (100 mg total) by mouth daily. Patient not taking: Reported on 08/26/2021 08/22/21   Aline August, MD  topiramate (TOPAMAX) 25 MG tablet Take 25 mg by mouth 2 (two) times daily. Patient not taking: Reported on 08/26/2021    [provider]  venlafaxine (EFFEXOR) 75 MG tablet Take 75 mg by mouth 2 (two) times daily. Patient not taking: Reported on 08/26/2021 05/15/21   [provider]  ferrous sulfate 325 (65 FE) MG tablet Take 1 tablet (325 mg total) by mouth daily with breakfast. Patient not taking: Reported on 09/23/2018 08/09/18 10/09/18  Elodia Florence., MD  pantoprazole (PROTONIX) 40 MG tablet Take 1 tablet (40 mg total) by mouth 2 (two) times daily before a meal. Patient not taking: Reported on 08/14/2020 06/27/20 08/14/20  Charlynne Cousins, MD      Allergies    Tomato, Aspirin, and Sulfa antibiotics    Review of Systems   Review of Systems  Physical Exam Updated Vital Signs BP 132/80 (BP Location: Right Arm)   Pulse 87   Temp 98.8 F (37.1 C) (Oral)   Resp 17   Ht 5' 7.99" (1.727 m)   Wt 74 kg   SpO2 100%   BMI 24.81 kg/m  Physical Exam  ED Results / Procedures / Treatments   Labs (all labs ordered are listed, but only abnormal results are displayed) Labs Reviewed  COMPREHENSIVE METABOLIC PANEL - Abnormal; Notable for the following components:      Result Value   Glucose, Bld 128 (*)    Total Protein 8.2 (*)    AST 92 (*)    Alkaline Phosphatase 136 (*)    All other  components within normal limits  CBC WITH DIFFERENTIAL/PLATELET - Abnormal; Notable for the following components:   Hemoglobin 12.4 (*)    HCT 38.8 (*)    RDW 18.5 (*)    Platelets 87 (*)    All other components within normal limits  URINALYSIS, ROUTINE W REFLEX MICROSCOPIC    EKG EKG Interpretation  Date/Time:  Saturday September 05 2021 14:17:22 EDT Ventricular Rate:  77 PR Interval:  163 QRS Duration: 102 QT Interval:  378 QTC Calculation: 428 R Axis:   86 Text Interpretation: Sinus rhythm Borderline ST elevation, anterolateral leads since last tracing no significant change Confirmed by Daleen Bo [phone]) on 09/05/2021 2:22:41 PM  Radiology CT Head Wo Contrast  Result Date: 09/05/2021 CLINICAL  DATA:  Blunt head trauma. Abnormal mental status. Alcohol misuse. EXAM: CT HEAD WITHOUT CONTRAST TECHNIQUE: Contiguous axial images were obtained from the base of the skull through the vertex without intravenous contrast. RADIATION DOSE REDUCTION: This exam was performed according to the departmental dose-optimization program which includes automated exposure control, adjustment of the mA and/or kV according to patient size and/or use of iterative reconstruction technique. COMPARISON:  07/15/2021 FINDINGS: Brain: No evidence of intracranial hemorrhage, acute infarction, hydrocephalus, extra-axial collection, or mass lesion/mass effect. Mild diffuse cerebral atrophy and chronic small vessel disease are unchanged in appearance. Vascular:  No hyperdense vessel or other acute findings. Skull: No evidence of fracture or other significant bone abnormality. Sinuses/Orbits:  No acute findings. Other: None. IMPRESSION: No acute intracranial abnormality. Stable cerebral atrophy and chronic small vessel disease. Electronically Signed   By: Marlaine Hind M.D.   On: 09/05/2021 15:07    Procedures Procedures  {Document cardiac monitor, telemetry assessment procedure when appropriate:1}  Medications Ordered in ED Medications  sodium chloride 0.9 % bolus 1,000 mL (0 mLs Intravenous Stopped 09/05/21 1525)  ondansetron (ZOFRAN) injection 4 mg (4 mg Intravenous Given 09/05/21 1407)    ED Course/ Medical Decision Making/ A&P                           Medical Decision Making  ***  {Document critical care time when appropriate:1} {Document review of labs and clinical decision tools ie heart score, Chads2Vasc2 etc:1}  {Document your independent review of radiology images, and any outside records:1} {Document your discussion with family members, caretakers, and with consultants:1} {Document social determinants of health affecting pt's care:1} {Document your decision making why or why not admission, treatments were  needed:1} Final Clinical Impression(s) / ED Diagnoses Final diagnoses:  Syncope, unspecified syncope type  Alcoholic intoxication without complication ([place])    Rx / DC Orders ED Discharge Orders     None

## 2021-09-05 NOTE — ED Triage Notes (Signed)
Patient BIB [facility], patient homeless. Passerby called EMS. Patient brought to ED w/ c/o fatigue, dehydration, hasnt eaten in three days. History of alcohol abuse. Last drink today. Patient reports pain "all over". Would not rate.

## 2021-09-05 NOTE — Discharge Instructions (Signed)
Your lab work and CT scan today were reassuring.  Please try and drink water to ensure you are staying well-hydrated especially as the weather gets hotter.

## 2021-09-05 NOTE — ED Notes (Signed)
Patient provided with two sandwiches, ginger ale, cup of ice, applesauce. Patient eating meal without difficulty.

## 2021-09-11 ENCOUNTER — Encounter (HOSPITAL_COMMUNITY): Payer: Self-pay | Admitting: Emergency Medicine

## 2021-09-11 ENCOUNTER — Other Ambulatory Visit: Payer: Self-pay

## 2021-09-11 ENCOUNTER — Emergency Department (HOSPITAL_COMMUNITY)
Admission: EM | Admit: 2021-09-11 | Discharge: 2021-09-12 | Disposition: A | Discharge status: Home / Self Care | Payer: Medicaid - Out of State | Attending: Emergency Medicine | Admitting: Emergency Medicine

## 2021-09-11 DIAGNOSIS — F1090 Alcohol use, unspecified, uncomplicated: Secondary | ICD-10-CM

## 2021-09-11 DIAGNOSIS — Z5900 Homelessness unspecified: Secondary | ICD-10-CM | POA: Diagnosis not present

## 2021-09-11 DIAGNOSIS — Y908 Blood alcohol level of 240 mg/100 ml or more: Secondary | ICD-10-CM | POA: Diagnosis not present

## 2021-09-11 DIAGNOSIS — Z20822 Contact with and (suspected) exposure to covid-19: Secondary | ICD-10-CM | POA: Insufficient documentation

## 2021-09-11 DIAGNOSIS — F10129 Alcohol abuse with intoxication, unspecified: Secondary | ICD-10-CM | POA: Diagnosis not present

## 2021-09-11 DIAGNOSIS — I10 Essential (primary) hypertension: Secondary | ICD-10-CM | POA: Insufficient documentation

## 2021-09-11 DIAGNOSIS — D6959 Other secondary thrombocytopenia: Secondary | ICD-10-CM

## 2021-09-11 DIAGNOSIS — R45851 Suicidal ideations: Secondary | ICD-10-CM | POA: Diagnosis not present

## 2021-09-11 DIAGNOSIS — R748 Abnormal levels of other serum enzymes: Secondary | ICD-10-CM | POA: Insufficient documentation

## 2021-09-11 DIAGNOSIS — F1020 Alcohol dependence, uncomplicated: Secondary | ICD-10-CM

## 2021-09-11 DIAGNOSIS — R000 Tachycardia, unspecified: Secondary | ICD-10-CM | POA: Diagnosis not present

## 2021-09-11 DIAGNOSIS — R1110 Vomiting, unspecified: Secondary | ICD-10-CM | POA: Diagnosis not present

## 2021-09-11 DIAGNOSIS — F19188 Other psychoactive substance abuse with other psychoactive substance-induced disorder: Secondary | ICD-10-CM | POA: Diagnosis present

## 2021-09-11 DIAGNOSIS — K709 Alcoholic liver disease, unspecified: Secondary | ICD-10-CM

## 2021-09-11 DIAGNOSIS — D649 Anemia, unspecified: Secondary | ICD-10-CM | POA: Insufficient documentation

## 2021-09-11 DIAGNOSIS — F10920 Alcohol use, unspecified with intoxication, uncomplicated: Secondary | ICD-10-CM

## 2021-09-11 DIAGNOSIS — F1014 Alcohol abuse with alcohol-induced mood disorder: Secondary | ICD-10-CM

## 2021-09-11 LAB — CBC WITH DIFFERENTIAL/PLATELET
Abs Immature Granulocytes: 0.02 10*3/uL (ref 0.00–0.07)
Basophils Absolute: 0.1 10*3/uL (ref 0.0–0.1)
Basophils Relative: 1 %
Eosinophils Absolute: 0.1 10*3/uL (ref 0.0–0.5)
Eosinophils Relative: 2 %
HCT: 35.6 % — ABNORMAL LOW (ref 39.0–52.0)
Hemoglobin: 11.3 g/dL — ABNORMAL LOW (ref 13.0–17.0)
Immature Granulocytes: 0 %
Lymphocytes Relative: 34 %
Lymphs Abs: 2 10*3/uL (ref 0.7–4.0)
MCH: 26.5 pg (ref 26.0–34.0)
MCHC: 31.7 g/dL (ref 30.0–36.0)
MCV: 83.4 fL (ref 80.0–100.0)
Monocytes Absolute: 0.7 10*3/uL (ref 0.1–1.0)
Monocytes Relative: 12 %
Neutro Abs: 3.1 10*3/uL (ref 1.7–7.7)
Neutrophils Relative %: 51 %
Platelets: 60 10*3/uL — ABNORMAL LOW (ref 150–400)
RBC: 4.27 MIL/uL (ref 4.22–5.81)
RDW: 19.1 % — ABNORMAL HIGH (ref 11.5–15.5)
WBC: 6.1 10*3/uL (ref 4.0–10.5)
nRBC: 0 % (ref 0.0–0.2)

## 2021-09-11 LAB — COMPREHENSIVE METABOLIC PANEL
ALT: 60 U/L — ABNORMAL HIGH (ref 0–44)
AST: 179 U/L — ABNORMAL HIGH (ref 15–41)
Albumin: 4.1 g/dL (ref 3.5–5.0)
Alkaline Phosphatase: 188 U/L — ABNORMAL HIGH (ref 38–126)
Anion gap: 13 (ref 5–15)
BUN: 5 mg/dL — ABNORMAL LOW (ref 6–20)
CO2: 23 mmol/L (ref 22–32)
Calcium: 8.9 mg/dL (ref 8.9–10.3)
Chloride: 101 mmol/L (ref 98–111)
Creatinine, Ser: 0.87 mg/dL (ref 0.61–1.24)
GFR, Estimated: >60 mL/min (ref >60)
Glucose, Bld: 95 mg/dL (ref 70–99)
Potassium: 4 mmol/L (ref 3.5–5.1)
Sodium: 137 mmol/L (ref 135–145)
Total Bilirubin: 0.7 mg/dL (ref 0.3–1.2)
Total Protein: 7.8 g/dL (ref 6.5–8.1)

## 2021-09-11 LAB — SALICYLATE LEVEL: Salicylate Lvl: <7 mg/dL — ABNORMAL LOW (ref 7.0–30.0)

## 2021-09-11 LAB — ACETAMINOPHEN LEVEL: Acetaminophen (Tylenol), Serum: <10 ug/mL — ABNORMAL LOW (ref 10–30)

## 2021-09-11 LAB — RAPID URINE DRUG SCREEN, HOSP PERFORMED
Amphetamines: NOT DETECTED
Barbiturates: NOT DETECTED
Benzodiazepines: NOT DETECTED
Cocaine: NOT DETECTED
Opiates: NOT DETECTED
Tetrahydrocannabinol: NOT DETECTED

## 2021-09-11 LAB — LIPASE, BLOOD: Lipase: 58 U/L — ABNORMAL HIGH (ref 11–51)

## 2021-09-11 LAB — ETHANOL: Alcohol, Ethyl (B): 430 mg/dL (ref <10)

## 2021-09-11 LAB — RESP PANEL BY RT-PCR (FLU A&B, COVID) ARPGX2
Influenza A by PCR: NEGATIVE
Influenza B by PCR: NEGATIVE
SARS Coronavirus 2 by RT PCR: NEGATIVE

## 2021-09-11 MED ORDER — NICOTINE 21 MG/24HR TD PT24: 21.0000 mg | MEDICATED_PATCH | Freq: Every day | TRANSDERMAL | Status: DC | PRN

## 2021-09-11 MED ORDER — LORAZEPAM 2 MG/ML IJ SOLN: 0.0000 mg | Freq: Four times a day (QID) | INTRAMUSCULAR | Status: DC

## 2021-09-11 MED ORDER — LORAZEPAM 2 MG/ML IJ SOLN: 0.0000 mg | Freq: Two times a day (BID) | INTRAMUSCULAR | Status: DC

## 2021-09-11 MED ORDER — ONDANSETRON HCL 4 MG PO TABS
4.0000 mg | ORAL_TABLET | Freq: Three times a day (TID) | ORAL | Status: DC | PRN
  Administered 2021-09-12: 4 mg via ORAL
  Filled 2021-09-11: qty 1

## 2021-09-11 MED ORDER — ALUM & MAG HYDROXIDE-SIMETH 200-200-20 MG/5ML PO SUSP: 30.0000 mL | Freq: Four times a day (QID) | ORAL | Status: DC | PRN

## 2021-09-11 MED ORDER — THIAMINE HCL 100 MG/ML IJ SOLN: 100.0000 mg | Freq: Every day | INTRAMUSCULAR | Status: DC

## 2021-09-11 MED ORDER — THIAMINE HCL 100 MG PO TABS
100.0000 mg | ORAL_TABLET | Freq: Every day | ORAL | Status: DC
  Administered 2021-09-12: 100 mg via ORAL
  Filled 2021-09-11: qty 1

## 2021-09-11 MED ORDER — LORAZEPAM 1 MG PO TABS
0.0000 mg | ORAL_TABLET | Freq: Four times a day (QID) | ORAL | Status: DC
  Administered 2021-09-12 (×3): 1 mg via ORAL
  Filled 2021-09-11 (×3): qty 1

## 2021-09-11 MED ORDER — LORAZEPAM 1 MG PO TABS: 0.0000 mg | ORAL_TABLET | Freq: Two times a day (BID) | ORAL | Status: DC

## 2021-09-12 MED ORDER — LORAZEPAM 1 MG PO TABS
1.0000 mg | ORAL_TABLET | Freq: Once | ORAL | Status: AC
  Administered 2021-09-12: 1 mg via ORAL
  Filled 2021-09-12: qty 1

## 2021-09-12 MED ORDER — CHLORDIAZEPOXIDE HCL 25 MG PO CAPS: ORAL_CAPSULE | ORAL | 0 refills | Status: DC

## 2021-09-12 NOTE — ED Notes (Signed)
Pt belonging inventoried and in locker #14, 1 belonging with security

## 2021-09-12 NOTE — ED Notes (Signed)
Pt belongings and valuables returned

## 2021-09-16 ENCOUNTER — Other Ambulatory Visit: Payer: Self-pay

## 2021-09-16 ENCOUNTER — Encounter (HOSPITAL_COMMUNITY): Payer: Self-pay

## 2021-09-16 ENCOUNTER — Emergency Department (HOSPITAL_COMMUNITY)
Admission: EM | Admit: 2021-09-16 | Discharge: 2021-09-18 | Disposition: A | Discharge status: Home / Self Care | Payer: Medicaid - Out of State | Attending: Emergency Medicine | Admitting: Emergency Medicine

## 2021-09-16 DIAGNOSIS — Z79899 Other long term (current) drug therapy: Secondary | ICD-10-CM | POA: Diagnosis not present

## 2021-09-16 DIAGNOSIS — R45851 Suicidal ideations: Secondary | ICD-10-CM | POA: Diagnosis not present

## 2021-09-16 DIAGNOSIS — F1010 Alcohol abuse, uncomplicated: Secondary | ICD-10-CM | POA: Insufficient documentation

## 2021-09-16 DIAGNOSIS — Y908 Blood alcohol level of 240 mg/100 ml or more: Secondary | ICD-10-CM | POA: Diagnosis not present

## 2021-09-16 DIAGNOSIS — I10 Essential (primary) hypertension: Secondary | ICD-10-CM | POA: Diagnosis not present

## 2021-09-16 DIAGNOSIS — F1024 Alcohol dependence with alcohol-induced mood disorder: Secondary | ICD-10-CM | POA: Insufficient documentation

## 2021-09-16 DIAGNOSIS — Z20822 Contact with and (suspected) exposure to covid-19: Secondary | ICD-10-CM | POA: Diagnosis not present

## 2021-09-16 DIAGNOSIS — F1029 Alcohol dependence with unspecified alcohol-induced disorder: Secondary | ICD-10-CM

## 2021-09-16 DIAGNOSIS — F1094 Alcohol use, unspecified with alcohol-induced mood disorder: Secondary | ICD-10-CM | POA: Diagnosis present

## 2021-09-16 LAB — CBC WITH DIFFERENTIAL/PLATELET
Abs Immature Granulocytes: 0.01 10*3/uL (ref 0.00–0.07)
Basophils Absolute: 0.1 10*3/uL (ref 0.0–0.1)
Basophils Relative: 1 %
Eosinophils Absolute: 0.1 10*3/uL (ref 0.0–0.5)
Eosinophils Relative: 3 %
HCT: 38.7 % — ABNORMAL LOW (ref 39.0–52.0)
Hemoglobin: 12.2 g/dL — ABNORMAL LOW (ref 13.0–17.0)
Immature Granulocytes: 0 %
Lymphocytes Relative: 46 %
Lymphs Abs: 1.6 10*3/uL (ref 0.7–4.0)
MCH: 26.9 pg (ref 26.0–34.0)
MCHC: 31.5 g/dL (ref 30.0–36.0)
MCV: 85.2 fL (ref 80.0–100.0)
Monocytes Absolute: 0.4 10*3/uL (ref 0.1–1.0)
Monocytes Relative: 13 %
Neutro Abs: 1.3 10*3/uL — ABNORMAL LOW (ref 1.7–7.7)
Neutrophils Relative %: 37 %
Platelets: 77 10*3/uL — ABNORMAL LOW (ref 150–400)
RBC: 4.54 MIL/uL (ref 4.22–5.81)
RDW: 19.6 % — ABNORMAL HIGH (ref 11.5–15.5)
WBC: 3.5 10*3/uL — ABNORMAL LOW (ref 4.0–10.5)
nRBC: 0 % (ref 0.0–0.2)

## 2021-09-16 LAB — COMPREHENSIVE METABOLIC PANEL
ALT: 60 U/L — ABNORMAL HIGH (ref 0–44)
AST: 135 U/L — ABNORMAL HIGH (ref 15–41)
Albumin: 4.5 g/dL (ref 3.5–5.0)
Alkaline Phosphatase: 148 U/L — ABNORMAL HIGH (ref 38–126)
Anion gap: 10 (ref 5–15)
BUN: 8 mg/dL (ref 6–20)
CO2: 23 mmol/L (ref 22–32)
Calcium: 9 mg/dL (ref 8.9–10.3)
Chloride: 108 mmol/L (ref 98–111)
Creatinine, Ser: 0.75 mg/dL (ref 0.61–1.24)
GFR, Estimated: >60 mL/min (ref >60)
Glucose, Bld: 91 mg/dL (ref 70–99)
Potassium: 4 mmol/L (ref 3.5–5.1)
Sodium: 141 mmol/L (ref 135–145)
Total Bilirubin: 0.6 mg/dL (ref 0.3–1.2)
Total Protein: 8.9 g/dL — ABNORMAL HIGH (ref 6.5–8.1)

## 2021-09-16 LAB — RESP PANEL BY RT-PCR (FLU A&B, COVID) ARPGX2
Influenza A by PCR: NEGATIVE
Influenza B by PCR: NEGATIVE
SARS Coronavirus 2 by RT PCR: NEGATIVE

## 2021-09-16 LAB — ACETAMINOPHEN LEVEL: Acetaminophen (Tylenol), Serum: <10 ug/mL — ABNORMAL LOW (ref 10–30)

## 2021-09-16 LAB — RAPID URINE DRUG SCREEN, HOSP PERFORMED
Amphetamines: NOT DETECTED
Barbiturates: NOT DETECTED
Benzodiazepines: POSITIVE — AB
Cocaine: NOT DETECTED
Opiates: NOT DETECTED
Tetrahydrocannabinol: NOT DETECTED

## 2021-09-16 LAB — ETHANOL: Alcohol, Ethyl (B): 361 mg/dL (ref <10)

## 2021-09-16 LAB — SALICYLATE LEVEL: Salicylate Lvl: <7 mg/dL — ABNORMAL LOW (ref 7.0–30.0)

## 2021-09-16 MED ORDER — THIAMINE HCL 100 MG/ML IJ SOLN
100.0000 mg | Freq: Every day | INTRAMUSCULAR | Status: DC
Start: 2021-09-16 — End: 2021-09-18

## 2021-09-16 MED ORDER — ONDANSETRON HCL 4 MG PO TABS: 4.0000 mg | ORAL_TABLET | Freq: Three times a day (TID) | ORAL | Status: DC | PRN

## 2021-09-16 MED ORDER — LORAZEPAM 2 MG/ML IJ SOLN: 0.0000 mg | Freq: Four times a day (QID) | INTRAMUSCULAR | Status: DC

## 2021-09-16 MED ORDER — THIAMINE HCL 100 MG PO TABS
100.0000 mg | ORAL_TABLET | Freq: Every day | ORAL | Status: DC
  Administered 2021-09-16 – 2021-09-18 (×3): 100 mg via ORAL
  Filled 2021-09-16 (×3): qty 1

## 2021-09-16 MED ORDER — ACETAMINOPHEN 325 MG PO TABS: 650.0000 mg | ORAL_TABLET | ORAL | Status: DC | PRN

## 2021-09-16 MED ORDER — LORAZEPAM 1 MG PO TABS
0.0000 mg | ORAL_TABLET | Freq: Four times a day (QID) | ORAL | Status: DC
  Administered 2021-09-16 – 2021-09-17 (×2): 2 mg via ORAL
  Administered 2021-09-17 – 2021-09-18 (×3): 1 mg via ORAL
  Filled 2021-09-16: qty 1
  Filled 2021-09-16: qty 2
  Filled 2021-09-16 (×2): qty 1
  Filled 2021-09-16: qty 2

## 2021-09-16 MED ORDER — LORAZEPAM 1 MG PO TABS: 0.0000 mg | ORAL_TABLET | Freq: Two times a day (BID) | ORAL | Status: DC

## 2021-09-16 MED ORDER — LORAZEPAM 2 MG/ML IJ SOLN: 0.0000 mg | Freq: Two times a day (BID) | INTRAMUSCULAR | Status: DC

## 2021-09-16 NOTE — ED Notes (Signed)
Pt has two belonging bag behind nurses station of hall [address]. Pt has a pants, shirt, pair of shoes, pair of socks, wallet, and some jewelry. Pt also brought a blue black that is with the rest of his belongings.

## 2021-09-16 NOTE — ED Triage Notes (Signed)
Pt BIBA from parking lot, endorses SI, some HI, ETOH on board, "as much as I can get my hands on". States he wants to talk to someone about stopping ETOH and about SI. No specific plan.  170/80 HR 100 RR 18 SpO2 96% CBG 121

## 2021-09-16 NOTE — ED Provider Notes (Signed)
Barry Moore   CSN: [id number] Arrival date & time: 09/16/21  1218     History PMH: ETOH abuse, HTN Chief Complaint  Patient presents with  . Alcohol Problem  . Suicidal    Barry Moore is a 56 y.o. male. Presents with SI. He has been trying to drink himself to death. He has been drinking since he was a kid, but thinks it is getting worse. His last drink was right before he came to the ED. He does not know how much he is drinking. He reports that his SI are worsening as well. He once tried to OD on Lithium. He feels if he leaves today, he might hurt himself. He also endorses HI. He really wants to "strangle this guy with his hands". He did not elaborate on this any more. He had drank a lot today, but does not feel "drunk".  He denies any other complaints.    Alcohol Problem       Home Medications Prior to Admission medications   Medication Sig Start Date End Date Taking? Authorizing Provider  amLODipine (NORVASC) 5 MG tablet Take 1 tablet (5 mg total) by mouth daily. Patient not taking: Reported on 08/26/2021 08/22/21   Aline August, MD  chlordiazePOXIDE (LIBRIUM) 25 MG capsule 25 mg PO TID x 1 day, then 25 mg PO BID X 1 day, then 25 mg PO QD X 1 day Patient not taking: Reported on 09/16/2021 09/12/21   Lajean Saver, MD  folic acid (FOLVITE) 1 MG tablet Take 1 tablet (1 mg total) by mouth daily. Patient not taking: Reported on 08/26/2021 08/22/21   Aline August, MD  Multiple Vitamin (MULTIVITAMIN WITH MINERALS) TABS tablet Take 1 tablet by mouth daily. Patient not taking: Reported on 08/26/2021 08/22/21   Aline August, MD  thiamine 100 MG tablet Take 1 tablet (100 mg total) by mouth daily. Patient not taking: Reported on 08/26/2021 08/22/21   Aline August, MD  ferrous sulfate 325 (65 FE) MG tablet Take 1 tablet (325 mg total) by mouth daily with breakfast. Patient not taking: Reported on 09/23/2018 08/09/18 10/09/18  Elodia Florence., MD  pantoprazole (PROTONIX) 40 MG tablet Take 1 tablet (40 mg total) by mouth 2 (two) times daily before a meal. Patient not taking: Reported on 08/14/2020 06/27/20 08/14/20  Charlynne Cousins, MD      Allergies    Tomato, Aspirin, and Sulfa antibiotics    Review of Systems   Review of Systems  Psychiatric/Behavioral:  Positive for suicidal ideas. Negative for agitation, behavioral problems, confusion, decreased concentration, dysphoric mood, hallucinations, self-injury and sleep disturbance. The patient is not nervous/anxious and is not hyperactive.   All other systems reviewed and are negative.   Physical Exam Updated Vital Signs BP 131/77 (BP Location: Left Arm)   Pulse 80   Temp 99.4 F (37.4 C) (Oral)   Resp 18   Ht '5\' 8"'$  (1.727 m)   Wt 73.9 kg   SpO2 93%   BMI 24.77 kg/m  Physical Exam Vitals and nursing Moore reviewed.  Constitutional:      General: He is not in acute distress.    Appearance: Normal appearance. He is well-developed. He is not ill-appearing, toxic-appearing or diaphoretic.  HENT:     Head: Normocephalic and atraumatic.     Nose: No nasal deformity.     Mouth/Throat:     Lips: Pink. No lesions.  Eyes:     General: Gaze aligned  appropriately. No scleral icterus.       Right eye: No discharge.        Left eye: No discharge.     Conjunctiva/sclera: Conjunctivae normal.     Right eye: Right conjunctiva is not injected. No exudate or hemorrhage.    Left eye: Left conjunctiva is not injected. No exudate or hemorrhage. Pulmonary:     Effort: Pulmonary effort is normal. No respiratory distress.  Skin:    General: Skin is warm and dry.  Neurological:     Mental Status: He is alert and oriented to person, place, and time.  Psychiatric:        Attention and Perception: Attention and perception normal. He is attentive. He does not perceive auditory or visual hallucinations.        Mood and Affect: Mood is depressed. Mood is not anxious or elated. Affect  is not labile, blunt, flat, angry, tearful or inappropriate.        Speech: Speech normal. He is communicative. Speech is not rapid and pressured, delayed, slurred or tangential.        Behavior: Behavior normal. Behavior is not agitated, slowed, aggressive, withdrawn, hyperactive or combative. Behavior is cooperative.        Thought Content: Thought content is not paranoid or delusional. Thought content includes homicidal and suicidal ideation. Thought content includes suicidal plan. Thought content does not include homicidal plan.        Cognition and Memory: Cognition and memory normal. Cognition is not impaired. Memory is not impaired. He does not exhibit impaired recent memory or impaired remote memory.        Judgment: Judgment normal. Judgment is not impulsive or inappropriate.     ED Results / Procedures / Treatments   Labs (all labs ordered are listed, but only abnormal results are displayed) Labs Reviewed  COMPREHENSIVE METABOLIC PANEL - Abnormal; Notable for the following components:      Result Value   Total Protein 8.9 (*)    AST 135 (*)    ALT 60 (*)    Alkaline Phosphatase 148 (*)    All other components within normal limits  ETHANOL - Abnormal; Notable for the following components:   Alcohol, Ethyl (B) 361 (*)    All other components within normal limits  RAPID URINE DRUG SCREEN, HOSP PERFORMED - Abnormal; Notable for the following components:   Benzodiazepines POSITIVE (*)    All other components within normal limits  CBC WITH DIFFERENTIAL/PLATELET - Abnormal; Notable for the following components:   WBC 3.5 (*)    Hemoglobin 12.2 (*)    HCT 38.7 (*)    RDW 19.6 (*)    Platelets 77 (*)    Neutro Abs 1.3 (*)    All other components within normal limits  ACETAMINOPHEN LEVEL - Abnormal; Notable for the following components:   Acetaminophen (Tylenol), Serum <10 (*)    All other components within normal limits  SALICYLATE LEVEL - Abnormal; Notable for the following  components:   Salicylate Lvl <2.5 (*)    All other components within normal limits  RESP PANEL BY RT-PCR (FLU A&B, COVID) [id number]    EKG EKG Interpretation  Date/Time:  Wednesday September 16 2021 14:05:47 EDT Ventricular Rate:  93 PR Interval:  156 QRS Duration: 92 QT Interval:  352 QTC Calculation: 437 R Axis:   67 Text Interpretation: Normal sinus rhythm Normal ECG When compared with ECG of 11-Sep-2021 23:11, PREVIOUS ECG IS PRESENT No significant change was found Confirmed  by Daleen Bo [id number]) on 09/16/2021 4:40:05 PM  Radiology No results found.  Procedures Procedures   Medications Ordered in ED Medications  LORazepam (ATIVAN) injection 0-4 mg ( Intravenous See Alternative 09/16/21 1405)    Or  LORazepam (ATIVAN) tablet 0-4 mg (2 mg Oral Given 09/16/21 1405)  LORazepam (ATIVAN) injection 0-4 mg (has no administration in time range)    Or  LORazepam (ATIVAN) tablet 0-4 mg (has no administration in time range)  thiamine tablet 100 mg (100 mg Oral Given 09/16/21 1405)    Or  thiamine (B-1) injection 100 mg ( Intravenous See Alternative 09/16/21 1405)  acetaminophen (TYLENOL) tablet 650 mg (has no administration in time range)  ondansetron (ZOFRAN) tablet 4 mg (has no administration in time range)    ED Course/ Medical Decision Making/ A&P Clinical Course as of 09/16/21 2228  Wed Sep 16, 2021  2228 Patient not taking any home medications so these were not ordered.  [GL]    Clinical Course User Index [GL] Sherre Poot Adora Fridge, PA-C                           Medical Decision Making Amount and/or Complexity of Data Reviewed Labs: ordered.  Risk OTC drugs. Prescription drug management.    MDM  This is a 56 y.o. male who presents to the ED with SI and ETOH use  My Impression, Plan, and ED Course:  He has thoughts of harming himself through increased alcohol use. He feels like he is unsafe to be discharged. He thinks he will hurt himself. He wants help with his mental  health and drinking issues.  Patient with some evidence of intoxication, but normal speech, he is making sense.  No signs of withdrawal. CIWA orders placed. Patient will need psych consult. Medical clearance labs placed.    I personally ordered, reviewed, and interpreted all laboratory work and imaging and agree with radiologist interpretation. Results interpreted below:  Wbc 3.5, nonspecific. Hcg 12.2 stable from baseline. CMP with stable LFT elevations. ETOH 361 which is comparable to prior visits. + benzos. Acetaminophen and ASA negative. COVID/Flu negative EKG reassuring  No acute findings. Patient is medically cleared for psychiatric evaluation for suicidal ideations.  Charting Requirements Additional history is obtained from:  Independent historian External Records from outside source obtained and reviewed including: prior labs and psych admissions Social Determinants of Health:  Alcoholism/Drug Addiction Pertinant PMH that complicates patient's illness: ETOH use disorder  Patient Care Problems that were addressed during this visit: - Suicidal Ideation: Acute illness - Alcohol Abuse: Chronic illness with threat to life or bodily function This patient was maintained on a cardiac monitor/telemetry. I personally viewed and interpreted the cardiac monitor which reveals an underlying rhythm of NSR Medications given in ED: n/a Reevaluation of the patient after these medicines showed that the patient stayed the same I have reviewed home medications and made changes accordingly.  Critical Care Interventions: n/a Consultations: TTS Disposition: TTS consult  Portions of this Moore were generated with Dragon dictation software. Dictation errors may occur despite best attempts at proofreading.     Final Clinical Impression(s) / ED Diagnoses Final diagnoses:  Suicidal ideation  Alcohol abuse    Rx / DC Orders ED Discharge Orders     None         Adolphus Birchwood,  PA-C 09/16/21 1926    Daleen Bo, MD 09/16/21 1930

## 2021-09-17 DIAGNOSIS — F1024 Alcohol dependence with alcohol-induced mood disorder: Secondary | ICD-10-CM

## 2021-09-17 NOTE — BH Assessment (Signed)
Per Dr. Serafina Mitchell in the 9am morning huddle/bed meeting, Provider to assess. No TTS CCA is needed at this time.

## 2021-09-17 NOTE — Consult Note (Signed)
[facility] ED ASSESSMENT   Reason for Consult:  Psychiatry evaluation Referring Physician:  ER Physician Patient Identification: Barry Moore MRN:  [id number] ED Chief Complaint: Alcohol dependence with alcohol-induced mood disorder ([place])  Diagnosis:  Principal Problem:   Alcohol dependence with alcohol-induced mood disorder [facility])   ED Assessment Time Calculation: Start Time: 1638 Stop Time: 1712 Total Time in Minutes (Assessment Completion): 34   Subjective:   Barry Moore is a 56 y.o. male patient admitted with hx of Depression and Alcohol dependence seen this evening.  Patient .was brought in from a parking lot and at the time he endorses SI/HI/and was had alcohol on board.  His alcohol level was 361 on arrival to the ER yesterday.  HPI:  Patient was seen in the ER in May for Alcohol intoxication as well.  Patient reported that he drinks 3-4 40 OZ beers daily.  He stated that he has been using alcohol since age 27.  He reported twice alcohol withdrawal seizures.  He also reported alcohol has affected his liver.  His Liver enzymes are slightly elevated.  Patient is unemployed and homeless and stated that he uses Alcohol to numb his feeling.  Patient stated that his sister is dealing with rough medical issue and that he is using alcohol to manage his mood.   He reported feeling suicidal and he plans to use Alcohol to kill himself.  In the past he OD on Lithium and he is also having Homicidal ideation to strangle somebody who wronged him.  He did not say the name of the person he want to kill.  Patient is interested in getting help with his Alcoholism.  He has mild tremors of his finger.  He is on  CIWA using Ativan for coverage based on score.  He is eating and drinking and tolerating same.  We will reevaluate in am and make appropriate disposition.  Past Psychiatric History: Alcohol dependence, Depression, anxiety.  Self reported one time OD on Lithium  Risk to Self or Others: Is the  patient at risk to self? Yes Has the patient been a risk to self in the past 6 months? Yes Has the patient been a risk to self within the distant past? Yes Is the patient a risk to others? Yes Has the patient been a risk to others in the past 6 months? Yes Has the patient been a risk to others within the distant past? Yes  [place] Scale:  [place] ED from 09/16/2021 in [place] DEPT ED from 09/11/2021 in [place] ED from 09/05/2021 in [place] DEPT  C-SSRS RISK CATEGORY Moderate Risk No Risk High Risk       AIMS:  , , ,  ,   ASAM:    Substance Abuse:     Past Medical History:  Past Medical History:  Diagnosis Date   Alcoholic hepatitis    Depression    ETOH abuse    Gastric bleed 08/2018   Hypertension    Pancreatitis    Seizures ([place])    Suicidal behavior     Past Surgical History:  Procedure Laterality Date   BIOPSY  08/05/2018   Procedure: BIOPSY;  Surgeon: Irving Copas., MD;  Location: [facility] ENDOSCOPY;  Service: Gastroenterology;;   BIOPSY  08/07/2018   Procedure: BIOPSY;  Surgeon: Thornton Park, MD;  Location: [facility] ENDOSCOPY;  Service: Gastroenterology;;   BIOPSY  01/02/2019   Procedure: BIOPSY;  Surgeon: Silvano Rusk  E, MD;  Location: [facility] ENDOSCOPY;  Service: Endoscopy;;   COLONOSCOPY WITH PROPOFOL N/A 08/07/2018   Procedure: COLONOSCOPY WITH PROPOFOL;  Surgeon: Thornton Park, MD;  Location: [place];  Service: Gastroenterology;  Laterality: N/A;   ESOPHAGOGASTRODUODENOSCOPY N/A 01/02/2019   Procedure: ESOPHAGOGASTRODUODENOSCOPY (EGD);  Surgeon: Gatha Mayer, MD;  Location: [facility] END[facility];  Service: Endoscopy;  Laterality: N/A;   ESOPHAGOGASTRODUODENOSCOPY N/A 07/03/2019   Procedure: ESOPHAGOGASTRODUODENOSCOPY (EGD);  Surgeon: Wonda Horner, MD;  Location: [facility] ENDOSCOPY;  Service: Endoscopy;  Laterality: N/A;   ESOPHAGOGASTRODUODENOSCOPY N/A  06/26/2020   Procedure: ESOPHAGOGASTRODUODENOSCOPY (EGD);  Surgeon: Arta Silence, MD;  Location: [facility] ENDOSCOPY;  Service: Endoscopy;  Laterality: N/A;   ESOPHAGOGASTRODUODENOSCOPY (EGD) WITH PROPOFOL N/A 11/02/2016   Procedure: ESOPHAGOGASTRODUODENOSCOPY (EGD) WITH PROPOFOL;  Surgeon: Carol Ada, MD;  Location: [facility] ENDOSCOPY;  Service: Endoscopy;  Laterality: N/A;   ESOPHAGOGASTRODUODENOSCOPY (EGD) WITH PROPOFOL N/A 08/05/2018   Procedure: ESOPHAGOGASTRODUODENOSCOPY (EGD) WITH PROPOFOL;  Surgeon: Rush [facility]mark Telford Nab., MD;  Location: [place];  Service: Gastroenterology;  Laterality: N/A;   ESOPHAGOGASTRODUODENOSCOPY (EGD) WITH PROPOFOL N/A 07/14/2019   Procedure: ESOPHAGOGASTRODUODENOSCOPY (EGD) WITH PROPOFOL;  Surgeon: Otis Brace, MD;  Location: [facility] ENDOSCOPY;  Service: Gastroenterology;  Laterality: N/A;   HERNIA REPAIR     LEG SURGERY     POLYPECTOMY  08/07/2018   Procedure: POLYPECTOMY;  Surgeon: Thornton Park, MD;  Location[facility] END[facility]COPY;  Service: Gastroenterology;;   Family History:  Family History  Problem Relation Age of Onset   Diabetes Mother    Alcoholism Mother    Emphysema Father    Lung cancer Father    Alcoholism Father    Family Psychiatric  History: unknown Social History:  Social History   Substance and Sexual Activity  Alcohol Use Yes   Comment: 5+ BEERS DAILY, Patient does not know how much he drank tonight     Social History   Substance and Sexual Activity  Drug Use Not Currently   Frequency: 3.0 times per week   Types: Cocaine    Social History   Socioeconomic History   Marital status: Divorced    Spouse name: Not on file   Number of children: Not on file   Years of education: Not on file   Highest education level: Not on file  Occupational History   Not on file  Tobacco Use   Smoking status: Every Day    Packs/day: 1.00    Types: Cigarettes   Smokeless tobacco: Never  Vaping Use   Vaping Use: Never used  Substance and Sexual  Activity   Alcohol use: Yes    Comment: 5+ BEERS DAILY, Patient does not know how much he drank tonight   Drug use: Not Currently    Frequency: 3.0 times per week    Types: Cocaine   Sexual activity: Not Currently  Other Topics Concern   Not on file  Social History Narrative   Not on file   Social Determinants of Health   Financial Resource Strain: Not on file  Food Insecurity: Not on file  Transportation Needs: Not on file  Physical Activity: Not on file  Stress: Not on file  Social Connections: Not on file   Additional Social History:    Allergies:   Allergies  Allergen Reactions   Tomato Shortness Of Breath and Nausea And Vomiting   Aspirin Nausea And Vomiting   Sulfa Antibiotics Other (See Comments)    "Does not make me feel good"    Labs:  Results for orders placed or  performed during the hospital encounter of 09/16/21 (from the past 48 hour(s))  Comprehensive metabolic panel     Status: Abnormal   Collection Time: 09/16/21  1:23 PM  Result Value Ref Range   Sodium 141 135 - 145 mmol/L   Potassium 4.0 3.5 - 5.1 mmol/L   Chloride 108 98 - 111 mmol/L   CO2 23 22 - 32 mmol/L   Glucose, Bld 91 70 - 99 mg/dL    Comment: Glucose reference range applies only to samples taken after fasting for at least 8 hours.   BUN 8 6 - 20 mg/dL   Creatinine, Ser 0.75 0.61 - 1.24 mg/dL   Calcium 9.0 8.9 - 10.3 mg/dL   Total Protein 8.9 (H) 6.5 - 8.1 g/dL   Albumin 4.5 3.5 - 5.0 g/dL   AST 135 (H) 15 - 41 U/L   ALT 60 (H) 0 - 44 U/L   Alkaline Phosphatase 148 (H) 38 - 126 U/L   Total Bilirubin 0.6 0.3 - 1.2 mg/dL   GFR, Estimated >60 >60 mL/min    Comment: (NOTE) Calculated using the CKD-EPI Creatinine Equation (2021)    Anion gap 10 5 - 15    Comment: Performed at [facility], [place] [address], [place], [place] [address]  Ethanol     Status: Abnormal   Collection Time: 09/16/21  1:23 PM  Result Value Ref Range   Alcohol, Ethyl (B) 361 (HH) <10 mg/dL     Comment: CRITICAL RESULT CALLED TO, READ BACK BY AND VERIFIED WITH:  DOWN,P RN '@1505'$  09/16/21 EDENSCA (NOTE) Lowest detectable limit for serum alcohol is 10 mg/dL.  For medical purposes only. Performed at [facility], [place] [address], [place], [place] [address]   CBC with Diff     Status: Abnormal   Collection Time: 09/16/21  1:23 PM  Result Value Ref Range   WBC 3.5 (L) 4.0 - 10.5 K/uL   RBC 4.54 4.22 - 5.81 MIL/uL   Hemoglobin 12.2 (L) 13.0 - 17.0 g/dL   HCT 38.7 (L) 39.0 - 52.0 %   MCV 85.2 80.0 - 100.0 fL   MCH 26.9 26.0 - 34.0 pg   MCHC 31.5 30.0 - 36.0 g/dL   RDW 19.6 (H) 11.5 - 15.5 %   Platelets 77 (L) 150 - 400 K/uL    Comment: SPECIMEN CHECKED FOR CLOTS Immature Platelet Fraction may be clinically indicated, consider ordering this additional test [id number] REPEATED TO VERIFY PLATELET COUNT CONFIRMED BY SMEAR    nRBC 0.0 0.0 - 0.2 %   Neutrophils Relative % 37 %   Neutro Abs 1.3 (L) 1.7 - 7.7 K/uL   Lymphocytes Relative 46 %   Lymphs Abs 1.6 0.7 - 4.0 K/uL   Monocytes Relative 13 %   Monocytes Absolute 0.4 0.1 - 1.0 K/uL   Eosinophils Relative 3 %   Eosinophils Absolute 0.1 0.0 - 0.5 K/uL   Basophils Relative 1 %   Basophils Absolute 0.1 0.0 - 0.1 K/uL   Immature Granulocytes 0 %   Abs Immature Granulocytes 0.01 0.00 - 0.07 K/uL   Polychromasia PRESENT    Ovalocytes PRESENT     Comment: Performed at [facility], [place] [address] [place], [place] [address]  Acetaminophen level     Status: Abnormal   Collection Time: 09/16/21  1:23 PM  Result Value Ref Range   Acetaminophen (Tylenol), Serum <10 (L) 10 - 30 ug/mL    Comment: (NOTE) Therapeutic concentrations vary significantly.  A range of 10-30 ug/mL  may be an effective concentration for many patients. However, some  are best treated at concentrations outside of this range. Acetaminophen concentrations >150 ug/mL at 4 hours after ingestion  and >50 ug/mL at 12 hours  after ingestion are often associated with  toxic reactions.  Performed at [facility], [place] [address] [place], [place] [address]   Salicylate level     Status: Abnormal   Collection Time: 09/16/21  1:23 PM  Result Value Ref Range   Salicylate Lvl <4.0 (L) 7.0 - 30.0 mg/dL    Comment: Performed at [facility], [place] [address], [place], [place] [address]  Resp Panel by RT-PCR (Flu A&B, Covid) Anterior Nasal Swab     Status: None   Collection Time: 09/16/21  2:18 PM   Specimen: Anterior Nasal Swab  Result Value Ref Range   SARS Coronavirus 2 by RT PCR NEGATIVE NEGATIVE    Comment: (NOTE) SARS-CoV-2 target nucleic acids are NOT DETECTED.  The SARS-CoV-2 RNA is generally detectable in upper respiratory specimens during the acute phase of infection. The lowest concentration of SARS-CoV-2 viral copies this assay can detect is 138 copies/mL. A negative result does not preclude SARS-Cov-2 infection and should not be used as the sole basis for treatment or other patient management decisions. A negative result may occur with  improper specimen collection/handling, submission of specimen other than nasopharyngeal swab, presence of viral mutation(s) within the areas targeted by this assay, and inadequate number of viral copies(<138 copies/mL). A negative result must be combined with clinical observations, patient history, and epidemiological information. The expected result is Negative.  Fact Sheet for Patients:  EntrepreneurPulse.com.au  Fact Sheet for Healthcare Providers:  IncredibleEmployment.be  This test is no t yet approved or cleared by the [place] FDA and  has been authorized for detection and/or diagnosis of SARS-CoV-2 by FDA under an Emergency Use Authorization (EUA). This EUA will remain  in effect (meaning this test can be used) for the duration of the COVID-19 declaration under Section  564(b)(1) of the Act, 21 U.S.C.section 360bbb-3(b)(1), unless the authorization is terminated  or revoked sooner.       Influenza A by PCR NEGATIVE NEGATIVE   Influenza B by PCR NEGATIVE NEGATIVE    Comment: (NOTE) The Xpert Xpress SARS-CoV-2/FLU/RSV plus assay is intended as an aid in the diagnosis of influenza from Nasopharyngeal swab specimens and should not be used as a sole basis for treatment. Nasal washings and aspirates are unacceptable for Xpert Xpress SARS-CoV-2/FLU/RSV testing.  Fact Sheet for Patients: EntrepreneurPulse.com.au  Fact Sheet for Healthcare Providers: IncredibleEmployment.be  This test is not yet approved or cleared by the [place] FDA and has been authorized for detection and/or diagnosis of SARS-CoV-2 by FDA under an Emergency Use Authorization (EUA). This EUA will remain in effect (meaning this test can be used) for the duration of the COVID-19 declaration under Section 564(b)(1) of the Act, 21 U.S.C. section 360bbb-3(b)(1), unless the authorization is terminated or revoked.  Performed at [facility], [place] [address] [place],  [address]   Urine rapid drug screen (hosp performed)     Status: Abnormal   Collection Time: 09/16/21  5:20 PM  Result Value Ref Range   Opiates NONE DETECTED NONE DETECTED   Cocaine NONE DETECTED NONE DETECTED   Benzodiazepines POSITIVE (A) NONE DETECTED   Amphetamines NONE DETECTED NONE DETECTED   Tetrahydrocannabinol NONE DETECTED NONE DETECTED   Barbiturates NONE DETECTED  NONE DETECTED    Comment: (NOTE) DRUG SCREEN FOR MEDICAL PURPOSES ONLY.  IF CONFIRMATION IS NEEDED FOR ANY PURPOSE, NOTIFY LAB WITHIN 5 DAYS.  LOWEST DETECTABLE LIMITS FOR URINE DRUG SCREEN Drug Class                     Cutoff (ng/mL) Amphetamine and metabolites    1000 Barbiturate and metabolites    200 Benzodiazepine                 735 Tricyclics and metabolites      300 Opiates and metabolites        300 Cocaine and metabolites        300 THC                            50 Performed at [facility], [place] [address], [place], [place] [address]     Current Facility-Administered Medications  Medication Dose Route Frequency Provider Last Rate Last Admin   acetaminophen (TYLENOL) tablet 650 mg  650 mg Oral Q4H PRN Loeffler, Grace C, PA-C       LORazepam (ATIVAN) injection 0-4 mg  0-4 mg Intravenous Q6H Loeffler, Grace C, PA-C       Or   LORazepam (ATIVAN) tablet 0-4 mg  0-4 mg Oral Q6H Loeffler, Grace C, PA-C   2 mg at 09/17/21 0830   [START ON 09/18/2021] LORazepam (ATIVAN) injection 0-4 mg  0-4 mg Intravenous Q12H Loeffler, Grace C, PA-C       Or   [START ON 09/18/2021] LORazepam (ATIVAN) tablet 0-4 mg  0-4 mg Oral Q12H Loeffler, Grace C, PA-C       ondansetron (ZOFRAN) tablet 4 mg  4 mg Oral Q8H PRN Loeffler, Grace C, PA-C       thiamine tablet 100 mg  100 mg Oral Daily Loeffler, Grace C, PA-C   100 mg at 09/17/21 1017   Or   thiamine (B-1) injection 100 mg  100 mg Intravenous Daily Loeffler, Adora Fridge, PA-C       Current Outpatient Medications  Medication Sig Dispense Refill   amLODipine (NORVASC) 5 MG tablet Take 1 tablet (5 mg total) by mouth daily. (Patient not taking: Reported on 08/26/2021) 30 tablet 0   chlordiazePOXIDE (LIBRIUM) 25 MG capsule 25 mg PO TID x 1 day, then 25 mg PO BID X 1 day, then 25 mg PO QD X 1 day (Patient not taking: Reported on 09/16/2021) 6 capsule 0   folic acid (FOLVITE) 1 MG tablet Take 1 tablet (1 mg total) by mouth daily. (Patient not taking: Reported on 08/26/2021) 30 tablet 0   Multiple Vitamin (MULTIVITAMIN WITH MINERALS) TABS tablet Take 1 tablet by mouth daily. (Patient not taking: Reported on 08/26/2021)     thiamine 100 MG tablet Take 1 tablet (100 mg total) by mouth daily. (Patient not taking: Reported on 08/26/2021) 30 tablet 0    Musculoskeletal: Strength & Muscle Tone: within normal limits Gait &  Station: normal Patient leans: Front   Psychiatric Specialty Exam: Presentation  General Appearance: Casual  Eye Contact:Good  Speech:Clear and Coherent; Normal Rate  Speech Volume:Normal  Handedness:Right   Mood and Affect  Mood:Anxious; Depressed; Hopeless  Affect:Congruent   Thought Process  Thought Processes:Coherent; Linear  Descriptions of Associations:Intact  Orientation:Full (Time, Place and Person)  Thought Content:Logical  History of Schizophrenia/Schizoaffective disorder:No  Duration of Psychotic Symptoms:Less than six months  Hallucinations:Hallucinations: None  Ideas of Reference:None  Suicidal Thoughts:Suicidal Thoughts: Yes, Passive SI Passive Intent and/or Plan: -- (with any means)  Homicidal Thoughts:Homicidal Thoughts: Yes, Passive HI Passive Intent and/or Plan: -- (He want to choke a guy that wronged him if he sees him)   Sensorium  Memory:Immediate Good; Recent Good; Remote Good  Judgment:Poor  Insight:Fair   Executive Functions  Concentration:Fair  Attention Span:Good  [place] of Knowledge:Fair  Language:Good   Psychomotor Activity  Psychomotor Activity:Psychomotor Activity: Normal   Assets  Assets:Communication Skills    Sleep  Sleep:Sleep: Good   Physical Exam: Physical Exam Constitutional:      Appearance: Normal appearance.  HENT:     Head: Normocephalic and atraumatic.     Nose: Nose normal.  Cardiovascular:     Rate and Rhythm: Normal rate.  Pulmonary:     Effort: Pulmonary effort is normal.  Musculoskeletal:        General: Normal range of motion.     Cervical back: Normal range of motion.  Skin:    General: Skin is warm and dry.  Neurological:     Mental Status: He is alert and oriented to person, place, and time.    Review of Systems  Constitutional: Negative.   HENT: Negative.    Eyes: Negative.   Respiratory: Negative.    Cardiovascular: Negative.   Gastrointestinal:  Negative.   Genitourinary: Negative.   Musculoskeletal: Negative.   Skin: Negative.   Neurological: Negative.   Endo/Heme/Allergies: Negative.   Psychiatric/Behavioral:  Positive for depression, substance abuse and suicidal ideas. The patient is nervous/anxious.    Blood pressure (!) 151/91, pulse 93, temperature 97.6 F (36.4 C), temperature source Oral, resp. rate 20, height '5\' 8"'$  (1.727 m), weight 73.9 kg, SpO2 98 %. Body mass index is 24.77 kg/m.  Medical Decision Making: Patient is calm at this time and no Alcohol withdrawal symptom noted.  We will reevaluate in am and determine appropriate disposition.Patient is already placed on CIWA protocol using Ativan  Problem 1: Alcohol Dependence  Problem 2: Alcohol induced Mood disorder  Problem 3: Suicide ideation  Disposition:  Re-evaluate in am  Delfin Gant, NP-PMHNP-BC 09/17/2021 5:12 PM

## 2021-09-17 NOTE — ED Notes (Signed)
Dinner arrived. 

## 2021-09-17 NOTE — ED Provider Notes (Signed)
Emergency Medicine Observation Re-evaluation Note  Barry Moore is a 56 y.o. male, seen on rounds today.  Pt initially presented to the ED for complaints of Alcohol Problem and Suicidal Currently, the patient is resting.  Reports "feeling shaky" Exaggerated tremors when asked to hold his arms up.[name]  Physical Exam  BP (!) 160/81 (BP Location: Right Arm)   Pulse 78   Temp 97.9 F (36.6 C) (Oral)   Resp 18   Ht '5\' 8"'$  (1.727 m)   Wt 73.9 kg   SpO2 98%   BMI 24.77 kg/m  Physical Exam General: Calm Cardiac: Well-perfused Lungs: No distress Psych: Deferred  ED Course / MDM  EKG:EKG Interpretation  Date/Time:  Wednesday September 16 2021 14:05:47 EDT Ventricular Rate:  93 PR Interval:  156 QRS Duration: 92 QT Interval:  352 QTC Calculation: 437 R Axis:   67 Text Interpretation: Normal sinus rhythm Normal ECG When compared with ECG of 11-Sep-2021 23:11, PREVIOUS ECG IS PRESENT No significant change was found Confirmed by Daleen Bo [phone]) on 09/16/2021 4:40:05 PM  I have reviewed the labs performed to date as well as medications administered while in observation.  Recent changes in the last 24 hours include suicidal thoughts with alcohol withdrawal.  Plan  Current plan is for psychiatric evaluation.  Last CIWA score was 0.Kristian Covey is not under involuntary commitment.     Ezequiel Essex, MD 09/17/21 [phone]

## 2021-09-17 NOTE — BH Assessment (Signed)
TTS clinician attempted to complete assessment. Per Kiristin, RN, patient is alseep and unable to arouse. TTS will attempt at later time.

## 2021-09-18 NOTE — Discharge Instructions (Signed)
For your behavioral health needs you are advised to follow up with [facility].  They offer psychiatry, therapy and counseling for substance use disorders.  New patients are seen at their walk-in clinic.  Walk-in hours are Monday - Friday from 8:30 am - 12:00 pm, and from 1:00 pm - 2:30 pm.  Walk-in patients are seen on a first come, first served basis, so try to arrive as early as possible for the best chance of being seen the same day:       [facility]      [address]      [place], [place] [address]      [phone]

## 2021-09-18 NOTE — BH Assessment (Signed)
[place] Assessment Progress Note   Per Oneida Alar, NP, this voluntary pt does not require psychiatric hospitalization at this time.  Pt is psychiatrically cleared.  Discharge instructions include referral information for [facility].  EDP Daleen Bo, MD and pt's nurse, Eustaquio Maize, have been notified.  Jalene Mullet, [place] Triage Specialist [phone]

## 2021-09-18 NOTE — ED Notes (Signed)
Patient requesting to be D/Cd     Patient does not want  [facility]  resource.

## 2021-09-18 NOTE — ED Notes (Signed)
Patient alert this shift.  Cooperative.   Calm.   Guarded.  Medication compliant. Patient ate breakfast.  Patient resting comfortably in bed.,

## 2021-09-18 NOTE — ED Provider Notes (Signed)
Emergency Medicine Observation Re-evaluation Note  Barry Moore is a 56 y.o. male, seen on rounds today.  Pt initially presented to the ED for complaints of Alcohol Problem and Suicidal Currently, the patient is resting comfortably.  Cooperative and interactive.  Physical Exam  BP (!) 161/92 (BP Location: Right Arm)   Pulse 78   Temp 98.3 F (36.8 C) (Oral)   Resp 16   Ht '5\' 8"'$  (1.727 m)   Wt 73.9 kg   SpO2 98%   BMI 24.77 kg/m  Physical Exam General: Nontoxic appearing Cardiac: Normal heart rate Lungs: Normal respiratory rate Psych: No internal responsiveness  ED Course / MDM  EKG:EKG Interpretation  Date/Time:  Wednesday September 16 2021 14:05:47 EDT Ventricular Rate:  93 PR Interval:  156 QRS Duration: 92 QT Interval:  352 QTC Calculation: 437 R Axis:   67 Text Interpretation: Normal sinus rhythm Normal ECG When compared with ECG of 11-Sep-2021 23:11, PREVIOUS ECG IS PRESENT No significant change was found Confirmed by Barry Moore [phone]) on 09/16/2021 4:40:05 PM  I have reviewed the labs performed to date as well as medications administered while in observation.  Recent changes in the last 24 hours include he is comfortable and stable.  He is not showing any signs of acute psychiatric illness.  He has been cleared by psychiatry.  Plan  Current plan is for discharge.  Patient plans on going to AA.  Barry Moore is not under involuntary commitment.     Barry Bo, MD 09/18/21 [phone]

## 2021-09-18 NOTE — ED Notes (Signed)
Patient DC d off unit to home per provider. Patient alert, calm, cooperative, no s/s of distress. Patient given DC information and  resources.Belongings given to patient. Patient ambulatory off unit, escorted by NT.  Patient given bus pass for transportation.

## 2021-09-18 NOTE — Consult Note (Signed)
Telepsych Consultation   Reason for Consult:  psych consult Referring Physician:  Sheila Oats Location of Patient:  Barry Moore [id number] Location of Provider: [place] Department  Patient Identification: ALDAIR RICKEL MRN:  [id number] Principal Diagnosis: Alcohol dependence with alcohol-induced mood disorder ([place]) Diagnosis:  Principal Problem:   Alcohol dependence with alcohol-induced mood disorder ([place]) Active Problems:   Alcohol-induced mood disorder ([place])   Total Time spent with patient: 20 minutes  Subjective:   Barry Moore is a 56 y.o. male patient admitted with substance induced mood disorder.  Patient presents laying in bed, quiet. Alert and oriented to person, place (general); calm and cooperative. Disoriented to events that led to his admission due to being inebriated at the time; endorses daily alcohol intake of 4+ 40 oz beers/day. Expressed readiness for change with alcohol use. He denies any suicidal or homicidal ideations, auditory, perceptual, or visual disturbances at the time of this assessment. Last CIWA score at 0839- 6; received 1 mg Ativan.  HPI:  Barry Moore is a 56 year old male patient with past psychiatric history of alcohol dependence with alcohol-induced mood disorder, alcohol abuse, MDD recurrent severe without psychotic features, cocaine abuse, alcohol withdrawal who presented to [facility] 09/16/21 from parking lot with suicidal ideations and ETOH abuse. Per chart review, pt has presented 5 ED encounters within [facility] system with similar presentation. Patient states he is from [place] area of [place], currently homeless in [place]. UDS+ benzodiazepines, BAL 361. PDMP reviewed, no active prescriptions noted.   Past Psychiatric History: alcohol dependence with alcohol-induced mood disorder, alcohol abuse, MDD recurrent severe without psychotic features, cocaine abuse, alcohol withdrawal   Risk to Self:   Risk to Others:    Prior Inpatient Therapy:   Prior Outpatient Therapy:    Past Medical History:  Past Medical History:  Diagnosis Date   Alcoholic hepatitis    Depression    ETOH abuse    Gastric bleed 08/2018   Hypertension    Pancreatitis    Seizures ([place])    Suicidal behavior     Past Surgical History:  Procedure Laterality Date   BIOPSY  08/05/2018   Procedure: BIOPSY;  Surgeon: Irving Copas., MD;  Location: [facility] ENDOSCOPY;  Service: Gastroenterology;;   BIOPSY  08/07/2018   Procedure: BIOPSY;  Surgeon: Thornton Park, MD;  Location: [facility] END[facility]COPY;  Service: Gastroenterology;;   BIOPSY  01/02/2019   Procedure: BIOPSY;  Surgeon: Gatha Mayer, MD;  Location: [facility] ENDOSCOPY;  Service: Endoscopy;;   COLONOSCOPY WITH PROPOFOL N/A 08/07/2018   Procedure: COLONOSCOPY WITH PROPOFOL;  Surgeon: Thornton Park, MD;  Location: [place];  Service: Gastroenterology;  Laterality: N/A;   ESOPHAGOGASTRODUODENOSCOPY N/A 01/02/2019   Procedure: ESOPHAGOGASTRODUODENOSCOPY (EGD);  Surgeon: Gatha Mayer, MD;  Location: [facility] ENDOSCOPY;  Service: Endoscopy;  Laterality: N/A;   ESOPHAGOGASTRODUODENOSCOPY N/A 07/03/2019   Procedure: ESOPHAGOGASTRODUODENOSCOPY (EGD);  Surgeon: Wonda Horner, MD;  Location: [facility] ENDOSCOPY;  Service: Endoscopy;  Laterality: N/A;   ESOPHAGOGASTRODUODENOSCOPY N/A 06/26/2020   Procedure: ESOPHAGOGASTRODUODENOSCOPY (EGD);  Surgeon: Arta Silence, MD;  Location: [facility] ENDOSCOPY;  Service: Endoscopy;  Laterality: N/A;   ESOPHAGOGASTRODUODENOSCOPY (EGD) WITH PROPOFOL N/A 11/02/2016   Procedure: ESOPHAGOGASTRODUODENOSCOPY (EGD) WITH PROPOFOL;  Surgeon: Carol Ada, MD;  Location: [facility] ENDOSCOPY;  Service: Endoscopy;  Laterality: N/A;   ESOPHAGOGASTRODUODENOSCOPY (EGD) WITH PROPOFOL N/A 08/05/2018   Procedure: ESOPHAGOGASTRODUODENOSCOPY (EGD) WITH PROPOFOL;  Surgeon: Rush Landmark Telford Nab., MD;  Location: [place];  Service: Gastroenterology;  Laterality: N/A;  ESOPHAGOGASTRODUODENOSCOPY (EGD) WITH PROPOFOL N/A 07/14/2019   Procedure: ESOPHAGOGASTRODUODENOSCOPY (EGD) WITH PROPOFOL;  Surgeon: Otis Brace, MD;  Location: [facility] ENDOSCOPY;  Service: Gastroenterology;  Laterality: N/A;   HERNIA REPAIR     LEG SURGERY     POLYPECTOMY  08/07/2018   Procedure: POLYPECTOMY;  Surgeon: Thornton Park, MD;  Location: [facility] ENDOSCOPY;  Service: Gastroenterology;;   Family History:  Family History  Problem Relation Age of Onset   Diabetes Mother    Alcoholism Mother    Emphysema Father    Lung cancer Father    Alcoholism Father    Family Psychiatric  History: not noted Social History:  Social History   Substance and Sexual Activity  Alcohol Use Yes   Comment: 5+ BEERS DAILY, Patient does not know how much he drank tonight     Social History   Substance and Sexual Activity  Drug Use Not Currently   Frequency: 3.0 times per week   Types: Cocaine    Social History   Socioeconomic History   Marital status: Divorced    Spouse name: Not on file   Number of children: Not on file   Years of education: Not on file   Highest education level: Not on file  Occupational History   Not on file  Tobacco Use   Smoking status: Every Day    Packs/day: 1.00    Types: Cigarettes   Smokeless tobacco: Never  Vaping Use   Vaping Use: Never used  Substance and Sexual Activity   Alcohol use: Yes    Comment: 5+ BEERS DAILY, Patient does not know how much he drank tonight   Drug use: Not Currently    Frequency: 3.0 times per week    Types: Cocaine   Sexual activity: Not Currently  Other Topics Concern   Not on file  Social History Narrative   Not on file   Social Determinants of Health   Financial Resource Strain: Not on file  Food Insecurity: Not on file  Transportation Needs: Not on file  Physical Activity: Not on file  Stress: Not on file  Social Connections: Not on file   Additional Social History:    Allergies:   Allergies  Allergen  Reactions   Tomato Shortness Of Breath and Nausea And Vomiting   Aspirin Nausea And Vomiting   Sulfa Antibiotics Other (See Comments)    "Does not make me feel good"    Labs:  Results for orders placed or performed during the hospital encounter of 09/16/21 (from the past 48 hour(s))  Comprehensive metabolic panel     Status: Abnormal   Collection Time: 09/16/21  1:23 PM  Result Value Ref Range   Sodium 141 135 - 145 mmol/L   Potassium 4.0 3.5 - 5.1 mmol/L   Chloride 108 98 - 111 mmol/L   CO2 23 22 - 32 mmol/L   Glucose, Bld 91 70 - 99 mg/dL    Comment: Glucose reference range applies only to samples taken after fasting for at least 8 hours.   BUN 8 6 - 20 mg/dL   Creatinine, Ser 0.75 0.61 - 1.24 mg/dL   Calcium 9.0 8.9 - 10.3 mg/dL   Total Protein 8.9 (H) 6.5 - 8.1 g/dL   Albumin 4.5 3.5 - 5.0 g/dL   AST 135 (H) 15 - 41 U/L   ALT 60 (H) 0 - 44 U/L   Alkaline Phosphatase 148 (H) 38 - 126 U/L   Total Bilirubin 0.6 0.3 - 1.2 mg/dL  GFR, Estimated >60 >60 mL/min    Comment: (NOTE) Calculated using the CKD-EPI Creatinine Equation (2021)    Anion gap 10 5 - 15    Comment: Performed at [facility], [place] [address] [place], [place] [address]  Ethanol     Status: Abnormal   Collection Time: 09/16/21  1:23 PM  Result Value Ref Range   Alcohol, Ethyl (B) 361 (HH) <10 mg/dL    Comment: CRITICAL RESULT CALLED TO, READ BACK BY AND VERIFIED WITH:  DOWN,P RN '@1505'$  09/16/21 EDENSCA (NOTE) Lowest detectable limit for serum alcohol is 10 mg/dL.  For medical purposes only. Performed at [facility], [place] [address] [place], [place] [address]   CBC with Diff     Status: Abnormal   Collection Time: 09/16/21  1:23 PM  Result Value Ref Range   WBC 3.5 (L) 4.0 - 10.5 K/uL   RBC 4.54 4.22 - 5.81 MIL/uL   Hemoglobin 12.2 (L) 13.0 - 17.0 g/dL   HCT 38.7 (L) 39.0 - 52.0 %   MCV 85.2 80.0 - 100.0 fL   MCH 26.9 26.0 - 34.0 pg   MCHC 31.5 30.0 - 36.0 g/dL    RDW 19.6 (H) 11.5 - 15.5 %   Platelets 77 (L) 150 - 400 K/uL    Comment: SPECIMEN CHECKED FOR CLOTS Immature Platelet Fraction may be clinically indicated, consider ordering this additional test [id number] REPEATED TO VERIFY PLATELET COUNT CONFIRMED BY SMEAR    nRBC 0.0 0.0 - 0.2 %   Neutrophils Relative % 37 %   Neutro Abs 1.3 (L) 1.7 - 7.7 K/uL   Lymphocytes Relative 46 %   Lymphs Abs 1.6 0.7 - 4.0 K/uL   Monocytes Relative 13 %   Monocytes Absolute 0.4 0.1 - 1.0 K/uL   Eosinophils Relative 3 %   Eosinophils Absolute 0.1 0.0 - 0.5 K/uL   Basophils Relative 1 %   Basophils Absolute 0.1 0.0 - 0.1 K/uL   Immature Granulocytes 0 %   Abs Immature Granulocytes 0.01 0.00 - 0.07 K/uL   Polychromasia PRESENT    Ovalocytes PRESENT     Comment: Performed at [facility], [place] [address], [place], [place] [address]  Acetaminophen level     Status: Abnormal   Collection Time: 09/16/21  1:23 PM  Result Value Ref Range   Acetaminophen (Tylenol), Serum <10 (L) 10 - 30 ug/mL    Comment: (NOTE) Therapeutic concentrations vary significantly. A range of 10-30 ug/mL  may be an effective concentration for many patients. However, some  are best treated at concentrations outside of this range. Acetaminophen concentrations >150 ug/mL at 4 hours after ingestion  and >50 ug/mL at 12 hours after ingestion are often associated with  toxic reactions.  Performed at [facility], [place] [address] [place], [place] [address]   Salicylate level     Status: Abnormal   Collection Time: 09/16/21  1:23 PM  Result Value Ref Range   Salicylate Lvl <9.2 (L) 7.0 - 30.0 mg/dL    Comment: Performed at [facility], [place] [address], [place], [place] [address]  Resp Panel by RT-PCR (Flu A&B, Covid) Anterior Nasal Swab     Status: None   Collection Time: 09/16/21  2:18 PM   Specimen: Anterior Nasal Swab  Result Value Ref Range   SARS Coronavirus 2 by RT PCR  NEGATIVE NEGATIVE    Comment: (NOTE) SARS-CoV-2 target nucleic acids are NOT DETECTED.  The SARS-CoV-2  RNA is generally detectable in upper respiratory specimens during the acute phase of infection. The lowest concentration of SARS-CoV-2 viral copies this assay can detect is 138 copies/mL. A negative result does not preclude SARS-Cov-2 infection and should not be used as the sole basis for treatment or other patient management decisions. A negative result may occur with  improper specimen collection/handling, submission of specimen other than nasopharyngeal swab, presence of viral mutation(s) within the areas targeted by this assay, and inadequate number of viral copies(<138 copies/mL). A negative result must be combined with clinical observations, patient history, and epidemiological information. The expected result is Negative.  Fact Sheet for Patients:  EntrepreneurPulse.com.au  Fact Sheet for Healthcare Providers:  IncredibleEmployment.be  This test is no t yet approved or cleared by the [place] FDA and  has been authorized for detection and/or diagnosis of SARS-CoV-2 by FDA under an Emergency Use Authorization (EUA). This EUA will remain  in effect (meaning this test can be used) for the duration of the COVID-19 declaration under Section 564(b)(1) of the Act, 21 U.S.C.section 360bbb-3(b)(1), unless the authorization is terminated  or revoked sooner.       Influenza A by PCR NEGATIVE NEGATIVE   Influenza B by PCR NEGATIVE NEGATIVE    Comment: (NOTE) The Xpert Xpress SARS-CoV-2/FLU/RSV plus assay is intended as an aid in the diagnosis of influenza from Nasopharyngeal swab specimens and should not be used as a sole basis for treatment. Nasal washings and aspirates are unacceptable for Xpert Xpress SARS-CoV-2/FLU/RSV testing.  Fact Sheet for Patients: EntrepreneurPulse.com.au  Fact Sheet for Healthcare  Providers: IncredibleEmployment.be  This test is not yet approved or cleared by the [place] FDA and has been authorized for detection and/or diagnosis of SARS-CoV-2 by FDA under an Emergency Use Authorization (EUA). This EUA will remain in effect (meaning this test can be used) for the duration of the COVID-19 declaration under Section 564(b)(1) of the Act, 21 U.S.C. section 360bbb-3(b)(1), unless the authorization is terminated or revoked.  Performed at [facility], [place] [address] [place], [place] [address]   Urine rapid drug screen (hosp performed)     Status: Abnormal   Collection Time: 09/16/21  5:20 PM  Result Value Ref Range   Opiates NONE DETECTED NONE DETECTED   Cocaine NONE DETECTED NONE DETECTED   Benzodiazepines POSITIVE (A) NONE DETECTED   Amphetamines NONE DETECTED NONE DETECTED   Tetrahydrocannabinol NONE DETECTED NONE DETECTED   Barbiturates NONE DETECTED NONE DETECTED    Comment: (NOTE) DRUG SCREEN FOR MEDICAL PURPOSES ONLY.  IF CONFIRMATION IS NEEDED FOR ANY PURPOSE, NOTIFY LAB WITHIN 5 DAYS.  LOWEST DETECTABLE LIMITS FOR URINE DRUG SCREEN Drug Class                     Cutoff (ng/mL) Amphetamine and metabolites    1000 Barbiturate and metabolites    200 Benzodiazepine                 008 Tricyclics and metabolites     300 Opiates and metabolites        300 Cocaine and metabolites        300 THC                            50 Performed at [facility], [place] [address] [place], [place] [address]     Medications:  Current Facility-Administered Medications  Medication Dose Route Frequency Provider  Last Rate Last Admin   acetaminophen (TYLENOL) tablet 650 mg  650 mg Oral Q4H PRN Loeffler, Grace C, PA-C       LORazepam (ATIVAN) injection 0-4 mg  0-4 mg Intravenous Q6H Loeffler, Grace C, PA-C       Or   LORazepam (ATIVAN) tablet 0-4 mg  0-4 mg Oral Q6H Loeffler, Grace C, PA-C   1 mg at 09/18/21  9924   LORazepam (ATIVAN) injection 0-4 mg  0-4 mg Intravenous Q12H Loeffler, Grace C, PA-C       Or   LORazepam (ATIVAN) tablet 0-4 mg  0-4 mg Oral Q12H Loeffler, Grace C, PA-C       ondansetron (ZOFRAN) tablet 4 mg  4 mg Oral Q8H PRN Loeffler, Grace C, PA-C       thiamine tablet 100 mg  100 mg Oral Daily Loeffler, Grace C, PA-C   100 mg at 09/18/21 2683   Or   thiamine (B-1) injection 100 mg  100 mg Intravenous Daily Loeffler, Adora Fridge, PA-C       Current Outpatient Medications  Medication Sig Dispense Refill   amLODipine (NORVASC) 5 MG tablet Take 1 tablet (5 mg total) by mouth daily. (Patient not taking: Reported on 08/26/2021) 30 tablet 0   chlordiazePOXIDE (LIBRIUM) 25 MG capsule 25 mg PO TID x 1 day, then 25 mg PO BID X 1 day, then 25 mg PO QD X 1 day (Patient not taking: Reported on 09/16/2021) 6 capsule 0   folic acid (FOLVITE) 1 MG tablet Take 1 tablet (1 mg total) by mouth daily. (Patient not taking: Reported on 08/26/2021) 30 tablet 0   Multiple Vitamin (MULTIVITAMIN WITH MINERALS) TABS tablet Take 1 tablet by mouth daily. (Patient not taking: Reported on 08/26/2021)     thiamine 100 MG tablet Take 1 tablet (100 mg total) by mouth daily. (Patient not taking: Reported on 08/26/2021) 30 tablet 0   Musculoskeletal: Strength & Muscle Tone: within normal limits Gait & Station: normal Patient leans: N/A  Psychiatric Specialty Exam:  Presentation  General Appearance: Disheveled; Casual  Eye Contact:Fair  Speech:Clear and Coherent  Speech Volume:Normal  Handedness:Right   Mood and Affect  Mood:Dysphoric  Affect:Congruent   Thought Process  Thought Processes:Coherent  Descriptions of Associations:Intact  Orientation:Full (Time, Place and Person)  Thought Content:Logical  History of Schizophrenia/Schizoaffective disorder:No  Duration of Psychotic Symptoms:N/A  Hallucinations:Hallucinations: None  Ideas of Reference:None  Suicidal Thoughts:Suicidal Thoughts: No SI  Passive Intent and/or Plan: -- (with any means)  Homicidal Thoughts:Homicidal Thoughts: No HI Passive Intent and/or Plan: -- (He want to choke a guy that wronged him if he sees him)   Sensorium  Memory:Immediate Fair; Recent Fair; Remote Fair  Judgment:Fair  Insight:Fair   Executive Functions  Concentration:Fair  Attention Span:Fair  [place]   Psychomotor Activity  Psychomotor Activity:Psychomotor Activity: Normal   Assets  Assets:Communication Skills; Resilience   Sleep  Sleep:Sleep: Good    Physical Exam: Physical Exam Vitals and nursing note reviewed.  Constitutional:      General: He is not in acute distress.    Appearance: He is normal weight. He is not ill-appearing or diaphoretic.  HENT:     Head: Normocephalic.     Nose: Nose normal.     Mouth/Throat:     Mouth: Mucous membranes are moist.     Pharynx: Oropharynx is clear.  Eyes:     Pupils: Pupils are equal, round, and reactive to light.  Cardiovascular:  Rate and Rhythm: Normal rate.     Pulses: Normal pulses.  Pulmonary:     Effort: Pulmonary effort is normal.  Abdominal:     General: Abdomen is flat.  Musculoskeletal:        General: Normal range of motion.     Cervical back: Normal range of motion.  Skin:    General: Skin is warm and dry.  Neurological:     Mental Status: He is alert. Mental status is at baseline.  Psychiatric:        Attention and Perception: Attention and perception normal.        Mood and Affect: Affect is blunt.        Speech: Speech normal.        Behavior: Behavior is cooperative.        Thought Content: Thought content normal. Thought content is not paranoid or delusional. Thought content does not include homicidal or suicidal ideation. Thought content does not include homicidal or suicidal plan.        Cognition and Memory: Cognition and memory normal.        Judgment: Judgment normal.    Review of Systems   Psychiatric/Behavioral:  Positive for substance abuse.   All other systems reviewed and are negative.  Blood pressure (!) 161/92, pulse 78, temperature 98.3 F (36.8 C), temperature source Oral, resp. rate 16, height '5\' 8"'$  (1.727 m), weight 73.9 kg, SpO2 98 %. Body mass index is 24.77 kg/m.  Treatment Plan Summary: Daily contact with patient to assess and evaluate symptoms and progress in treatment, Medication management, and Plan Consult SW for resources to address psychosocial/substance abuse treatment needs.    Disposition: No evidence of imminent risk to self or others at present.   Patient does not meet criteria for psychiatric inpatient admission. Supportive therapy provided about ongoing stressors. Discussed crisis plan, support from social network, calling 911, coming to the Emergency Department, and calling Suicide Hotline.  This service was provided via telemedicine using a 2-way, interactive audio and video technology.  Names of all persons participating in this telemedicine service and their role in this encounter. Name: Oneida Alar Role: PMHNP  Name: Hampton Abbot Role: Attending MD  Name: Barry Moore Role: patient  Name:  Role:     Inda Merlin, NP 09/18/2021 10:36 AM

## 2021-09-25 ENCOUNTER — Encounter (HOSPITAL_COMMUNITY): Payer: Self-pay

## 2021-09-25 ENCOUNTER — Emergency Department (HOSPITAL_COMMUNITY)
Admission: EM | Admit: 2021-09-25 | Discharge: 2021-09-27 | Disposition: A | Discharge status: Home / Self Care | Payer: Medicaid - Out of State | Attending: Emergency Medicine | Admitting: Emergency Medicine

## 2021-09-25 DIAGNOSIS — Y908 Blood alcohol level of 240 mg/100 ml or more: Secondary | ICD-10-CM | POA: Diagnosis not present

## 2021-09-25 DIAGNOSIS — F1094 Alcohol use, unspecified with alcohol-induced mood disorder: Secondary | ICD-10-CM | POA: Diagnosis present

## 2021-09-25 DIAGNOSIS — Z79899 Other long term (current) drug therapy: Secondary | ICD-10-CM | POA: Insufficient documentation

## 2021-09-25 DIAGNOSIS — R45851 Suicidal ideations: Secondary | ICD-10-CM

## 2021-09-25 DIAGNOSIS — F1014 Alcohol abuse with alcohol-induced mood disorder: Secondary | ICD-10-CM | POA: Insufficient documentation

## 2021-09-25 DIAGNOSIS — E876 Hypokalemia: Secondary | ICD-10-CM | POA: Diagnosis not present

## 2021-09-25 DIAGNOSIS — F10120 Alcohol abuse with intoxication, uncomplicated: Secondary | ICD-10-CM | POA: Diagnosis present

## 2021-09-25 DIAGNOSIS — F10920 Alcohol use, unspecified with intoxication, uncomplicated: Secondary | ICD-10-CM

## 2021-09-25 NOTE — ED Triage Notes (Signed)
[place] with c/o SI and ETOH intoxication. States he wants to kill himself and doesn't care how he does it. Endorses drinking 4-5 40 oz a day. Hx of withdrawal seizures.

## 2021-09-26 DIAGNOSIS — F1094 Alcohol use, unspecified with alcohol-induced mood disorder: Secondary | ICD-10-CM

## 2021-09-26 LAB — URINALYSIS, ROUTINE W REFLEX MICROSCOPIC
Bilirubin Urine: NEGATIVE
Glucose, UA: NEGATIVE mg/dL
Hgb urine dipstick: NEGATIVE
Ketones, ur: NEGATIVE mg/dL
Leukocytes,Ua: NEGATIVE
Nitrite: NEGATIVE
Protein, ur: NEGATIVE mg/dL
Specific Gravity, Urine: 1.008 (ref 1.005–1.030)
pH: 5 (ref 5.0–8.0)

## 2021-09-26 LAB — COMPREHENSIVE METABOLIC PANEL
ALT: 51 U/L — ABNORMAL HIGH (ref 0–44)
AST: 88 U/L — ABNORMAL HIGH (ref 15–41)
Albumin: 4 g/dL (ref 3.5–5.0)
Alkaline Phosphatase: 141 U/L — ABNORMAL HIGH (ref 38–126)
Anion gap: 9 (ref 5–15)
BUN: 7 mg/dL (ref 6–20)
CO2: 25 mmol/L (ref 22–32)
Calcium: 8.5 mg/dL — ABNORMAL LOW (ref 8.9–10.3)
Chloride: 107 mmol/L (ref 98–111)
Creatinine, Ser: 0.65 mg/dL (ref 0.61–1.24)
GFR, Estimated: >60 mL/min (ref >60)
Glucose, Bld: 98 mg/dL (ref 70–99)
Potassium: 3.4 mmol/L — ABNORMAL LOW (ref 3.5–5.1)
Sodium: 141 mmol/L (ref 135–145)
Total Bilirubin: 0.6 mg/dL (ref 0.3–1.2)
Total Protein: 7.9 g/dL (ref 6.5–8.1)

## 2021-09-26 LAB — RAPID URINE DRUG SCREEN, HOSP PERFORMED
Amphetamines: NOT DETECTED
Barbiturates: NOT DETECTED
Benzodiazepines: NOT DETECTED
Cocaine: NOT DETECTED
Opiates: NOT DETECTED
Tetrahydrocannabinol: NOT DETECTED

## 2021-09-26 LAB — CBC
HCT: 34.1 % — ABNORMAL LOW (ref 39.0–52.0)
Hemoglobin: 11 g/dL — ABNORMAL LOW (ref 13.0–17.0)
MCH: 27.6 pg (ref 26.0–34.0)
MCHC: 32.3 g/dL (ref 30.0–36.0)
MCV: 85.7 fL (ref 80.0–100.0)
Platelets: 186 10*3/uL (ref 150–400)
RBC: 3.98 MIL/uL — ABNORMAL LOW (ref 4.22–5.81)
RDW: 18.4 % — ABNORMAL HIGH (ref 11.5–15.5)
WBC: 3.9 10*3/uL — ABNORMAL LOW (ref 4.0–10.5)
nRBC: 0 % (ref 0.0–0.2)

## 2021-09-26 LAB — ACETAMINOPHEN LEVEL: Acetaminophen (Tylenol), Serum: <10 ug/mL — ABNORMAL LOW (ref 10–30)

## 2021-09-26 LAB — SALICYLATE LEVEL: Salicylate Lvl: <7 mg/dL — ABNORMAL LOW (ref 7.0–30.0)

## 2021-09-26 LAB — ETHANOL: Alcohol, Ethyl (B): 345 mg/dL (ref <10)

## 2021-09-26 MED ORDER — LORAZEPAM 1 MG PO TABS
1.0000 mg | ORAL_TABLET | ORAL | Status: DC | PRN
  Administered 2021-09-26 – 2021-09-27 (×2): 2 mg via ORAL
  Administered 2021-09-27: 1 mg via ORAL
  Filled 2021-09-26: qty 2
  Filled 2021-09-26: qty 1
  Filled 2021-09-26: qty 2

## 2021-09-26 MED ORDER — LORAZEPAM 2 MG/ML IJ SOLN: 1.0000 mg | INTRAMUSCULAR | Status: DC | PRN

## 2021-09-26 MED ORDER — ADULT MULTIVITAMIN W/MINERALS CH
1.0000 | ORAL_TABLET | Freq: Every day | ORAL | Status: DC
  Administered 2021-09-26 – 2021-09-27 (×2): 1 via ORAL
  Filled 2021-09-26 (×2): qty 1

## 2021-09-26 MED ORDER — THIAMINE HCL 100 MG/ML IJ SOLN: 100.0000 mg | Freq: Every day | INTRAMUSCULAR | Status: DC

## 2021-09-26 MED ORDER — THIAMINE HCL 100 MG PO TABS
100.0000 mg | ORAL_TABLET | Freq: Every day | ORAL | Status: DC
  Administered 2021-09-26 – 2021-09-27 (×2): 100 mg via ORAL
  Filled 2021-09-26 (×2): qty 1

## 2021-09-26 MED ORDER — FOLIC ACID 1 MG PO TABS
1.0000 mg | ORAL_TABLET | Freq: Every day | ORAL | Status: DC
  Administered 2021-09-26 – 2021-09-27 (×2): 1 mg via ORAL
  Filled 2021-09-26 (×2): qty 1

## 2021-09-26 NOTE — Progress Notes (Signed)
TOC CSW has attached Substance resources  to pt's AVS.   Arlie [email or web address].[email or web address] Blomquist, MSW, [place]  Transitions of Care Clinical Social Worker I Direct Dial: [phone]  Fax: [phone] [email or web address]

## 2021-09-26 NOTE — ED Notes (Signed)
Pt changed into purple scrubs. Pt belongings placed in hospital bag. Pt has one hospital bag and one personal bag (blue) both labeled with pt stickers. Pt belongings placed in nurse's station cabinet '9-12 and [place] B'.

## 2021-09-26 NOTE — ED Notes (Signed)
Pt care taken, pt resting, no complaints at this time.

## 2021-09-26 NOTE — ED Provider Notes (Signed)
[facility] DEPT Provider Note   CSN: [id number] Arrival date & time: 09/25/21  2259     History  Chief Complaint  Patient presents with   Alcohol Intoxication   Suicidal    Barry Moore is a 56 y.o. male who presents to the emergency department complaining of alcohol intoxication onset today.  Patient notes that he drinks approximately 540 ounce beers per day.  He consumed that much today as well.  This is a typical amount for him.  Has a history of withdrawal seizures.  No management prior to arrival.  Endorses SI at this time, however does not have a plan at this time.  Denies HI, auditory/visual hallucinations.  The history is provided by the patient. No language interpreter was used.       Home Medications Prior to Admission medications   Medication Sig Start Date End Date Taking? Authorizing Provider  amLODipine (NORVASC) 5 MG tablet Take 1 tablet (5 mg total) by mouth daily. Patient not taking: Reported on 08/26/2021 08/22/21   Aline August, MD  chlordiazePOXIDE (LIBRIUM) 25 MG capsule 25 mg PO TID x 1 day, then 25 mg PO BID X 1 day, then 25 mg PO QD X 1 day Patient not taking: Reported on 09/16/2021 09/12/21   Lajean Saver, MD  folic acid (FOLVITE) 1 MG tablet Take 1 tablet (1 mg total) by mouth daily. Patient not taking: Reported on 08/26/2021 08/22/21   Aline August, MD  Multiple Vitamin (MULTIVITAMIN WITH MINERALS) TABS tablet Take 1 tablet by mouth daily. Patient not taking: Reported on 08/26/2021 08/22/21   Aline August, MD  thiamine 100 MG tablet Take 1 tablet (100 mg total) by mouth daily. Patient not taking: Reported on 08/26/2021 08/22/21   Aline August, MD  ferrous sulfate 325 (65 FE) MG tablet Take 1 tablet (325 mg total) by mouth daily with breakfast. Patient not taking: Reported on 09/23/2018 08/09/18 10/09/18  Elodia Florence., MD  pantoprazole (PROTONIX) 40 MG tablet Take 1 tablet (40 mg total) by mouth 2 (two) times daily before  a meal. Patient not taking: Reported on 08/14/2020 06/27/20 08/14/20  Charlynne Cousins, MD      Allergies    Tomato, Aspirin, and Sulfa antibiotics    Review of Systems   Review of Systems  Psychiatric/Behavioral:  Positive for suicidal ideas. Negative for hallucinations.   All other systems reviewed and are negative.   Physical Exam Updated Vital Signs BP 123/67   Pulse 83   Temp 98.7 F (37.1 C) (Oral)   Resp 16   Ht '5\' 11"'$  (1.803 m)   Wt 68 kg   SpO2 95%   BMI 20.92 kg/m  Physical Exam Vitals and nursing note reviewed.  Constitutional:      General: He is not in acute distress.    Appearance: He is not diaphoretic.     Comments: Patient asleep during physical examination and during time in the emergency department.  HENT:     Head: Normocephalic and atraumatic.     Mouth/Throat:     Pharynx: No oropharyngeal exudate.     Comments: Tongue fasciculations noted on exam. Eyes:     General: No scleral icterus.    Conjunctiva/sclera: Conjunctivae normal.  Cardiovascular:     Rate and Rhythm: Normal rate and regular rhythm.     Pulses: Normal pulses.     Heart sounds: Normal heart sounds.  Pulmonary:     Effort: Pulmonary effort is normal. No  respiratory distress.     Breath sounds: Normal breath sounds. No wheezing.  Abdominal:     General: Bowel sounds are normal.     Palpations: Abdomen is soft. There is no mass.     Tenderness: There is no abdominal tenderness. There is no guarding or rebound.  Musculoskeletal:        General: Normal range of motion.     Cervical back: Normal range of motion and neck supple.     Comments: Tremor noted on exam.  Skin:    General: Skin is warm and dry.  Neurological:     Mental Status: He is alert.  Psychiatric:        Behavior: Behavior normal.     ED Results / Procedures / Treatments   Labs (all labs ordered are listed, but only abnormal results are displayed) Labs Reviewed  COMPREHENSIVE METABOLIC PANEL - Abnormal;  Notable for the following components:      Result Value   Potassium 3.4 (*)    Calcium 8.5 (*)    AST 88 (*)    ALT 51 (*)    Alkaline Phosphatase 141 (*)    All other components within normal limits  ETHANOL - Abnormal; Notable for the following components:   Alcohol, Ethyl (B) 345 (*)    All other components within normal limits  CBC - Abnormal; Notable for the following components:   WBC 3.9 (*)    RBC 3.98 (*)    Hemoglobin 11.0 (*)    HCT 34.1 (*)    RDW 18.4 (*)    All other components within normal limits  RAPID URINE DRUG SCREEN, HOSP PERFORMED    EKG None  Radiology No results found.  Procedures Procedures    Medications Ordered in ED Medications - No data to display  ED Course/ Medical Decision Making/ A&P Clinical Course as of 09/26/21 8657  Sat Sep 26, 2021  0239 Alcohol, Ethyl (B)(!!): 345 [SB]  0400 Patient asleep resting comfortably on stretcher. [SB]    Clinical Course User Index [SB] Debraann Livingstone A, PA-C                           Medical Decision Making Amount and/or Complexity of Data Reviewed Labs: ordered. Decision-making details documented in ED Course.   Pt presents with concerns for alcohol intoxication onset today.  Drinks 540 ounce beers per day which is typical for him.  Last drink was today prior to arrival.  History of withdrawal seizures.  Endorses SI, no plan at this time.  Denies HI, auditory/visual hallucinations.  Patient afebrile.  On exam patient with tongue fasciculations, no acute cardiovascular or respiratory exam findings.  Differential diagnosis includes alcohol intoxication, arrhythmia, anemia.    Labs:  I ordered, and personally interpreted labs.  The pertinent results include:   UDS unremarkable. Ethanol elevated at 345. CBC unremarkable. Salicylate and acetaminophen level unremarkable. Urinalysis unremarkable  CMP with slightly decreased potassium at 3.4 otherwise unremarkable  Disposition: Presentation  suspicious for alcohol intoxication.  Doubt withdrawals at this time, CIWA at 2.  Doubt arrhythmia or anemia at this time.  Also concerning for SI, patient will follow evaluation with TTS.  Patient medically cleared at this time.  TTS consult placed.  Dispo pending TTS.   This chart was dictated using voice recognition software, Dragon. Despite the best efforts of this provider to proofread and correct errors, errors may still occur which can change documentation meaning.  Final Clinical Impression(s) / ED Diagnoses Final diagnoses:  Suicidal ideation  Alcoholic intoxication without complication [facility])    Rx / DC Orders ED Discharge Orders     None         Britzy Graul A, PA-C 09/26/21 2156    Truddie Hidden, MD 10/01/21 2255

## 2021-09-26 NOTE — Consult Note (Signed)
[facility] ED ASSESSMENT   Reason for Consult:  Psychiatry evaluation Referring Physician:  ER Physician Patient Identification: Barry Moore MRN:  [id number] ED Chief Complaint: Alcohol-induced mood disorder ([place])  Diagnosis:  Principal Problem:   Alcohol-induced mood disorder [facility])   ED Assessment Time Calculation: Start Time: 1649 Stop Time: 4235 Total Time in Minutes (Assessment Completion): 26   Subjective:   Barry Moore is a 56 y.o. male patient admitted with Alcohol intoxication with psychiatry hx of .alcohol dependence and Depression, Cocaine abuse.  Patient frequents the ER after getting drunk and intoxicated.  Patient was brought in from a gas station by EMS with c/o SI and alcohol intoxication.   HPI:  Patient was seen this afternoon sleeping.  He awoken easily and participated in the evaluation.  Patient reported that he drank 4-5 40 OZ Beer yesterday.  Patient admitted to using Cocaine last week when he left the ER. Patient reported feeling depressed, rated Depression 8/10 with 10 being severe depression.  His stressors includes being homeless, losing his [employer]ick and thinking about his sick sister whom he cannot go visit.  Patient reported that being homeless is a huge trigger for him.  Today he denied feeling depressed but was tremulous and shaking.  He has hx of alcohol withdrawal seizures .  Patient reported that he drinks alcohol for stress and frustration and not for sleep.  Patient reported good sleep and poor appetite.  He stated that he has not taken any Psychotropic from the moment he left the hospital last week.  This is his 38 th ER Visits with two hospitalizations-one medically due to alcohol intoxication and one at [facility] this year so far.  Patient replied that he does not know when he can stop using Alcohol and does not know what good alcohol is doing for him. Patient denies SI/HI/AVH  and no mention of paranoia.  Past Psychiatric History: alcohol dependence with  alcohol-induced mood disorder, alcohol abuse, MDD recurrent severe without psychotic features, cocaine abuse, alcohol withdrawal /intoxication, Medication non compliance, self reported OD on Lithium    Risk to Self or Others: Is the patient at risk to self? No Has the patient been a risk to self in the past 6 months? No Has the patient been a risk to self within the distant past? No Is the patient a risk to others? No Has the patient been a risk to others in the past 6 months? No Has the patient been a risk to others within the distant past? No  [place] Scale:  [facility] ED from 09/25/2021 in [facility] DEPT ED from 09/16/2021 in [facility] DEPT ED from 09/11/2021 in [facility] High Risk Moderate Risk No Risk       AIMS:  , , ,  ,   ASAM:    Substance Abuse:     Past Medical History:  Past Medical History:  Diagnosis Date   Alcoholic hepatitis    Depression    ETOH abuse    Gastric bleed 08/2018   Hypertension    Pancreatitis    Seizures ([place])    Suicidal behavior     Past Surgical History:  Procedure Laterality Date   BIOPSY  08/05/2018   Procedure: BIOPSY;  Surgeon: Irving Copas., MD;  Location: [facility] ENDOSCOPY;  Service: Gastroenterology;;   BIOPSY  08/07/2018   Procedure: BIOPSY;  Surgeon: Thornton Park, MD;  Location: [facility];  Service: Gastroenterology;;   BIOPSY  01/02/2019   Procedure: BIOPSY;  Surgeon: Gatha Mayer, MD;  Location: [facility] ENDOSCOPY;  Service: Endoscopy;;   COLONOSCOPY WITH PROPOFOL N/A 08/07/2018   Procedure: COLONOSCOPY WITH PROPOFOL;  Surgeon: Thornton Park, MD;  Location: [facility];  Service: Gastroenterology;  Laterality: N/A;   ESOPHAGOGASTRODUODENOSCOPY N/A 01/02/2019   Procedure: ESOPHAGOGASTRODUODENOSCOPY (EGD);  Surgeon: Gatha Mayer, MD;  Location: [facility] ENDOSCOPY;  Service: Endoscopy;   Laterality: N/A;   ESOPHAGOGASTRODUODENOSCOPY N/A 07/03/2019   Procedure: ESOPHAGOGASTRODUODENOSCOPY (EGD);  Surgeon: Wonda Horner, MD;  Location: [facility] ENDOSCOPY;  Service: Endoscopy;  Laterality: N/A;   ESOPHAGOGASTRODUODENOSCOPY N/A 06/26/2020   Procedure: ESOPHAGOGASTRODUODENOSCOPY (EGD);  Surgeon: Arta Silence, MD;  Location: [facility] ENDOSCOPY;  Service: Endoscopy;  Laterality: N/A;   ESOPHAGOGASTRODUODENOSCOPY (EGD) WITH PROPOFOL N/A 11/02/2016   Procedure: ESOPHAGOGASTRODUODENOSCOPY (EGD) WITH PROPOFOL;  Surgeon: Carol Ada, MD;  Location: [facility] ENDOSCOPY;  Service: Endoscopy;  Laterality: N/A;   ESOPHAGOGASTRODUODENOSCOPY (EGD) WITH PROPOFOL N/A 08/05/2018   Procedure: ESOPHAGOGASTRODUODENOSCOPY (EGD) WITH PROPOFOL;  Surgeon: Rush Landmark Telford Nab., MD;  Location: [facility];  Service: Gastroenterology;  Laterality: N/A;   ESOPHAGOGASTRODUODENOSCOPY (EGD) WITH PROPOFOL N/A 07/14/2019   Procedure: ESOPHAGOGASTRODUODENOSCOPY (EGD) WITH PROPOFOL;  Surgeon: Otis Brace, MD;  Location: [facility] ENDOSCOPY;  Service: Gastroenterology;  Laterality: N/A;   HERNIA REPAIR     LEG SURGERY     POLYPECTOMY  08/07/2018   Procedure: POLYPECTOMY;  Surgeon: Thornton Park, MD;  Location[facility] ENDOSCOPY;  Service: Gastroenterology;;   Family History:  Family History  Problem Relation Age of Onset   Diabetes Mother    Alcoholism Mother    Emphysema Father    Lung cancer Father    Alcoholism Father    Family Psychiatric  History: unknown Social History:  Social History   Substance and Sexual Activity  Alcohol Use Yes   Comment: 5+ BEERS DAILY, Patient does not know how much he drank tonight     Social History   Substance and Sexual Activity  Drug Use Not Currently   Frequency: 3.0 times per week   Types: Cocaine    Social History   Socioeconomic History   Marital status: Divorced    Spouse name: Not on file   Number of children: Not on file   Years of education: Not on file   Highest  education level: Not on file  Occupational History   Not on file  Tobacco Use   Smoking status: Every Day    Packs/day: 1.00    Types: Cigarettes   Smokeless tobacco: Never  Vaping Use   Vaping Use: Never used  Substance and Sexual Activity   Alcohol use: Yes    Comment: 5+ BEERS DAILY, Patient does not know how much he drank tonight   Drug use: Not Currently    Frequency: 3.0 times per week    Types: Cocaine   Sexual activity: Not Currently  Other Topics Concern   Not on file  Social History Narrative   Not on file   Social Determinants of Health   Financial Resource Strain: Not on file  Food Insecurity: Not on file  Transportation Needs: Not on file  Physical Activity: Not on file  Stress: Not on file  Social Connections: Not on file   Additional Social History:    Allergies:   Allergies  Allergen Reactions   Tomato Shortness Of Breath and Nausea And Vomiting   Aspirin Nausea And Vomiting   Sulfa Antibiotics Other (See Comments)    "Does  not make me feel good"    Labs:  Results for orders placed or performed during the hospital encounter of 09/25/21 (from the past 48 hour(s))  Comprehensive metabolic panel     Status: Abnormal   Collection Time: 09/25/21 11:52 PM  Result Value Ref Range   Sodium 141 135 - 145 mmol/L   Potassium 3.4 (L) 3.5 - 5.1 mmol/L   Chloride 107 98 - 111 mmol/L   CO2 25 22 - 32 mmol/L   Glucose, Bld 98 70 - 99 mg/dL    Comment: Glucose reference range applies only to samples taken after fasting for at least 8 hours.   BUN 7 6 - 20 mg/dL   Creatinine, Ser 0.65 0.61 - 1.24 mg/dL   Calcium 8.5 (L) 8.9 - 10.3 mg/dL   Total Protein 7.9 6.5 - 8.1 g/dL   Albumin 4.0 3.5 - 5.0 g/dL   AST 88 (H) 15 - 41 U/L   ALT 51 (H) 0 - 44 U/L   Alkaline Phosphatase 141 (H) 38 - 126 U/L   Total Bilirubin 0.6 0.3 - 1.2 mg/dL   GFR, Estimated >60 >60 mL/min    Comment: (NOTE) Calculated using the CKD-EPI Creatinine Equation (2021)    Anion gap 9 5 -  15    Comment: Performed at [name], [place] [address] [place], [place] [address]  Ethanol     Status: Abnormal   Collection Time: 09/25/21 11:52 PM  Result Value Ref Range   Alcohol, Ethyl (B) 345 (HH) <10 mg/dL    Comment: CRITICAL RESULT CALLED TO, READ BACK BY AND VERIFIED WITH: LIVINGSTON,K. 09/26/21 '@0030'$  BY SEEL,M. (NOTE) Lowest detectable limit for serum alcohol is 10 mg/dL.  For medical purposes only. Performed at [facility], [place] [address] [place], [place] [address]   cbc     Status: Abnormal   Collection Time: 09/25/21 11:52 PM  Result Value Ref Range   WBC 3.9 (L) 4.0 - 10.5 K/uL   RBC 3.98 (L) 4.22 - 5.81 MIL/uL   Hemoglobin 11.0 (L) 13.0 - 17.0 g/dL   HCT 34.1 (L) 39.0 - 52.0 %   MCV 85.7 80.0 - 100.0 fL   MCH 27.6 26.0 - 34.0 pg   MCHC 32.3 30.0 - 36.0 g/dL   RDW 18.4 (H) 11.5 - 15.5 %   Platelets 186 150 - 400 K/uL   nRBC 0.0 0.0 - 0.2 %    Comment: Performed at [facility], [place] [address] [place], [place] [address]  Acetaminophen level     Status: Abnormal   Collection Time: 09/25/21 11:52 PM  Result Value Ref Range   Acetaminophen (Tylenol), Serum <10 (L) 10 - 30 ug/mL    Comment: (NOTE) Therapeutic concentrations vary significantly. A range of 10-30 ug/mL  may be an effective concentration for many patients. However, some  are best treated at concentrations outside of this range. Acetaminophen concentrations >150 ug/mL at 4 hours after ingestion  and >50 ug/mL at 12 hours after ingestion are often associated with  toxic reactions.  Performed at [facility], [place] [address] [place], [place] [address]   Salicylate level     Status: Abnormal   Collection Time: 09/25/21 11:52 PM  Result Value Ref Range   Salicylate Lvl <8.6 (L) 7.0 - 30.0 mg/dL    Comment: Performed at [facility], [place] [address] [place],  [address]  Rapid urine drug screen  (hospital performed)  Status: None   Collection Time: 09/26/21  1:45 AM  Result Value Ref Range   Opiates NONE DETECTED NONE DETECTED   Cocaine NONE DETECTED NONE DETECTED   Benzodiazepines NONE DETECTED NONE DETECTED   Amphetamines NONE DETECTED NONE DETECTED   Tetrahydrocannabinol NONE DETECTED NONE DETECTED   Barbiturates NONE DETECTED NONE DETECTED    Comment: (NOTE) DRUG SCREEN FOR MEDICAL PURPOSES ONLY.  IF CONFIRMATION IS NEEDED FOR ANY PURPOSE, NOTIFY LAB WITHIN 5 DAYS.  LOWEST DETECTABLE LIMITS FOR URINE DRUG SCREEN Drug Class                     Cutoff (ng/mL) Amphetamine and metabolites    1000 Barbiturate and metabolites    200 Benzodiazepine                 354 Tricyclics and metabolites     300 Opiates and metabolites        300 Cocaine and metabolites        300 THC                            50 Performed at [facility], [place] [address], [place], [place] [address]   Urinalysis, Routine w reflex microscopic     Status: None   Collection Time: 09/26/21  1:45 AM  Result Value Ref Range   Color, Urine YELLOW YELLOW   APPearance CLEAR CLEAR   Specific Gravity, Urine 1.008 1.005 - 1.030   pH 5.0 5.0 - 8.0   Glucose, UA NEGATIVE NEGATIVE mg/dL   Hgb urine dipstick NEGATIVE NEGATIVE   Bilirubin Urine NEGATIVE NEGATIVE   Ketones, ur NEGATIVE NEGATIVE mg/dL   Protein, ur NEGATIVE NEGATIVE mg/dL   Nitrite NEGATIVE NEGATIVE   Leukocytes,Ua NEGATIVE NEGATIVE    Comment: Performed at [facility], [place] [address] [place], [place] [address]    Current Facility-Administered Medications  Medication Dose Route Frequency Provider Last Rate Last Admin   folic acid (FOLVITE) tablet 1 mg  1 mg Oral Daily Leevy-Johnson, Brooke A, NP   1 mg at 09/26/21 1113   LORazepam (ATIVAN) tablet 1-4 mg  1-4 mg Oral Q1H PRN Leevy-Johnson, Brooke A, NP       Or   LORazepam (ATIVAN) injection 1-4 mg  1-4 mg Intravenous Q1H PRN Leevy-Johnson, Brooke  A, NP       multivitamin with minerals tablet 1 tablet  1 tablet Oral Daily Leevy-Johnson, Brooke A, NP   1 tablet at 09/26/21 1114   thiamine tablet 100 mg  100 mg Oral Daily Leevy-Johnson, Brooke A, NP   100 mg at 09/26/21 1114   Or   thiamine (B-1) injection 100 mg  100 mg Intravenous Daily Leevy-Johnson, Brooke A, NP       Current Outpatient Medications  Medication Sig Dispense Refill   amLODipine (NORVASC) 5 MG tablet Take 1 tablet (5 mg total) by mouth daily. (Patient not taking: Reported on 08/26/2021) 30 tablet 0   chlordiazePOXIDE (LIBRIUM) 25 MG capsule 25 mg PO TID x 1 day, then 25 mg PO BID X 1 day, then 25 mg PO QD X 1 day (Patient not taking: Reported on 09/16/2021) 6 capsule 0   folic acid (FOLVITE) 1 MG tablet Take 1 tablet (1 mg total) by mouth daily. (Patient not taking: Reported on 08/26/2021) 30 tablet 0   Multiple Vitamin (MULTIVITAMIN WITH MINERALS) TABS tablet Take 1 tablet by mouth  daily. (Patient not taking: Reported on 08/26/2021)     thiamine 100 MG tablet Take 1 tablet (100 mg total) by mouth daily. (Patient not taking: Reported on 08/26/2021) 30 tablet 0    Musculoskeletal: Strength & Muscle Tone:  lying down in stretcher Gait & Station:  lying down in stretcher Patient leans:  see above   Psychiatric Specialty Exam: Presentation  General Appearance: Casual; Disheveled  Eye Contact:Fair  Speech:Garbled; Slow  Speech Volume:Decreased  Handedness:Right   Mood and Affect  Mood:Depressed  Affect:Congruent   Thought Process  Thought Processes:Coherent; Goal Directed; Linear  Descriptions of Associations:Intact  Orientation:Full (Time, Place and Person)  Thought Content:Logical  History of Schizophrenia/Schizoaffective disorder:No  Duration of Psychotic Symptoms:N/A  Hallucinations:Hallucinations: None  Ideas of Reference:None  Suicidal Thoughts:Suicidal Thoughts: No  Homicidal Thoughts:Homicidal Thoughts: No   Sensorium  Memory:Immediate  Good; Recent Good; Remote Good  Judgment:Poor  Insight:Poor   Executive Functions  Concentration:Good  Attention Span:Fair  Opp   Psychomotor Activity  Psychomotor Activity:Psychomotor Activity: Tremor; Other (comment) (Withdrawal shakes)   Assets  Assets:Communication Skills; Desire for Improvement    Sleep  Sleep:Sleep: Good   Physical Exam: Physical Exam Constitutional:      Appearance: Normal appearance.  HENT:     Head: Normocephalic and atraumatic.     Nose: Nose normal.  Cardiovascular:     Rate and Rhythm: Normal rate.  Pulmonary:     Effort: Pulmonary effort is normal.  Musculoskeletal:        General: Normal range of motion.     Cervical back: Normal range of motion.  Skin:    General: Skin is warm and dry.  Neurological:     General: No focal deficit present.     Mental Status: He is alert and oriented to person, place, and time.    Review of Systems  Constitutional:  Positive for weight loss.  HENT: Negative.    Eyes: Negative.   Respiratory: Negative.    Cardiovascular: Negative.   Gastrointestinal:        Poor appetite  Genitourinary: Negative.   Musculoskeletal: Negative.   Skin: Negative.   Neurological: Negative.   Endo/Heme/Allergies: Negative.   Psychiatric/Behavioral:  Positive for depression and substance abuse. The patient is nervous/anxious.    Blood pressure (!) 152/83, pulse 95, temperature 98.7 F (37.1 C), temperature source Oral, resp. rate 18, height '5\' 11"'$  (1.803 m), weight 68 kg, SpO2 99 %. Body mass index is 20.92 kg/m.  Medical Decision Making: Patient is not a danger to self today.  He denies si/hi.   With a hx of Alcohol withdrawal seizures we will monitor overnight and discharge patient in am.  Problem 1: Alcohol induced mood disorder  Problem 2: Recurrent Major Depressive disorder, severe without Psychotic Features  Problem 3: Cocaine  abuse.  Disposition:  Monitor over night and reevaluate in am.  Delfin Gant, NP-PMHNP-BC 09/26/2021 5:22 PM

## 2021-09-27 NOTE — ED Provider Notes (Signed)
On my reevaluation not endorsing any suicidal homicidal ideation.  He is contracting for safety with me.  Patient discharged.  This chart was dictated using voice recognition software.  Despite best efforts to proofread,  errors can occur which can change the documentation meaning.    Lennice Sites, DO 09/27/21 1006

## 2021-09-27 NOTE — ED Notes (Signed)
Given back one bookbag and one patient belonging bags, changes into personal clothes, discharge papers provided, bus pass also provided. Patient denies SI/HI. No questions at this time.

## 2021-10-02 ENCOUNTER — Emergency Department (HOSPITAL_COMMUNITY)
Admission: EM | Admit: 2021-10-02 | Discharge: 2021-10-03 | Disposition: A | Discharge status: Home / Self Care | Payer: Medicaid - Out of State | Attending: Emergency Medicine | Admitting: Emergency Medicine

## 2021-10-02 ENCOUNTER — Other Ambulatory Visit: Payer: Self-pay

## 2021-10-02 ENCOUNTER — Encounter (HOSPITAL_COMMUNITY): Payer: Self-pay

## 2021-10-02 DIAGNOSIS — F1020 Alcohol dependence, uncomplicated: Secondary | ICD-10-CM | POA: Insufficient documentation

## 2021-10-02 DIAGNOSIS — R45851 Suicidal ideations: Secondary | ICD-10-CM | POA: Diagnosis not present

## 2021-10-02 DIAGNOSIS — F1024 Alcohol dependence with alcohol-induced mood disorder: Secondary | ICD-10-CM | POA: Diagnosis not present

## 2021-10-02 DIAGNOSIS — R443 Hallucinations, unspecified: Secondary | ICD-10-CM | POA: Insufficient documentation

## 2021-10-02 DIAGNOSIS — F332 Major depressive disorder, recurrent severe without psychotic features: Secondary | ICD-10-CM | POA: Insufficient documentation

## 2021-10-02 DIAGNOSIS — R45850 Homicidal ideations: Secondary | ICD-10-CM | POA: Insufficient documentation

## 2021-10-02 DIAGNOSIS — Z79899 Other long term (current) drug therapy: Secondary | ICD-10-CM | POA: Insufficient documentation

## 2021-10-02 DIAGNOSIS — Y908 Blood alcohol level of 240 mg/100 ml or more: Secondary | ICD-10-CM | POA: Insufficient documentation

## 2021-10-02 DIAGNOSIS — F1094 Alcohol use, unspecified with alcohol-induced mood disorder: Secondary | ICD-10-CM

## 2021-10-02 LAB — COMPREHENSIVE METABOLIC PANEL
ALT: 55 U/L — ABNORMAL HIGH (ref 0–44)
AST: 89 U/L — ABNORMAL HIGH (ref 15–41)
Albumin: 4.4 g/dL (ref 3.5–5.0)
Alkaline Phosphatase: 107 U/L (ref 38–126)
Anion gap: 12 (ref 5–15)
BUN: 9 mg/dL (ref 6–20)
CO2: 23 mmol/L (ref 22–32)
Calcium: 8.4 mg/dL — ABNORMAL LOW (ref 8.9–10.3)
Chloride: 108 mmol/L (ref 98–111)
Creatinine, Ser: 0.71 mg/dL (ref 0.61–1.24)
GFR, Estimated: >60 mL/min (ref >60)
Glucose, Bld: 92 mg/dL (ref 70–99)
Potassium: 3.7 mmol/L (ref 3.5–5.1)
Sodium: 143 mmol/L (ref 135–145)
Total Bilirubin: 0.4 mg/dL (ref 0.3–1.2)
Total Protein: 8.2 g/dL — ABNORMAL HIGH (ref 6.5–8.1)

## 2021-10-02 LAB — ETHANOL: Alcohol, Ethyl (B): 351 mg/dL (ref <10)

## 2021-10-02 LAB — CBC
HCT: 35.8 % — ABNORMAL LOW (ref 39.0–52.0)
Hemoglobin: 11.6 g/dL — ABNORMAL LOW (ref 13.0–17.0)
MCH: 27.5 pg (ref 26.0–34.0)
MCHC: 32.4 g/dL (ref 30.0–36.0)
MCV: 84.8 fL (ref 80.0–100.0)
Platelets: 129 10*3/uL — ABNORMAL LOW (ref 150–400)
RBC: 4.22 MIL/uL (ref 4.22–5.81)
RDW: 18 % — ABNORMAL HIGH (ref 11.5–15.5)
WBC: 4.9 10*3/uL (ref 4.0–10.5)
nRBC: 0 % (ref 0.0–0.2)

## 2021-10-02 LAB — SALICYLATE LEVEL: Salicylate Lvl: <7 mg/dL — ABNORMAL LOW (ref 7.0–30.0)

## 2021-10-02 LAB — ACETAMINOPHEN LEVEL: Acetaminophen (Tylenol), Serum: <10 ug/mL — ABNORMAL LOW (ref 10–30)

## 2021-10-02 MED ORDER — ACETAMINOPHEN 325 MG PO TABS
650.0000 mg | ORAL_TABLET | Freq: Once | ORAL | Status: AC
  Administered 2021-10-02: 650 mg via ORAL
  Filled 2021-10-02: qty 2

## 2021-10-02 MED ORDER — THIAMINE HCL 100 MG/ML IJ SOLN
100.0000 mg | Freq: Once | INTRAMUSCULAR | Status: AC
  Administered 2021-10-02: 100 mg via INTRAMUSCULAR
  Filled 2021-10-02: qty 2

## 2021-10-02 NOTE — ED Notes (Signed)
Dr. Maryan Rued made aware of critical ETOH 351.

## 2021-10-02 NOTE — ED Notes (Signed)
Pt given a warm blanket, pt siting in treatment chair at current. No other needs voiced

## 2021-10-02 NOTE — ED Triage Notes (Addendum)
Pt BIB police with SI. Pt states that he does not want to live, why would he? Pt reports drinking a lot of beers today.

## 2021-10-02 NOTE — ED Provider Notes (Signed)
[facility] DEPT Provider Note   CSN: [id number] Arrival date & time: 10/02/21  1948     History  Chief Complaint  Patient presents with   Suicidal    Barry Moore is a 56 y.o. male with past medical history significant for alcohol abuse, cocaine abuse, homelessness who presents with active suicidal ideation, homicidal ideation, hallucinations, with voices telling him to end his life.  Patient reports that he is going to try to end his life by drinking himself to death or not eating anything and starting himself to death.  Patient reports that he has pain all over but this has been going on for a long time.  He is clinically intoxicated on arrival, accompanied by police.  HPI     Home Medications Prior to Admission medications   Medication Sig Start Date End Date Taking? Authorizing Provider  amLODipine (NORVASC) 5 MG tablet Take 1 tablet (5 mg total) by mouth daily. Patient not taking: Reported on 08/26/2021 08/22/21   Aline August, MD  chlordiazePOXIDE (LIBRIUM) 25 MG capsule 25 mg PO TID x 1 day, then 25 mg PO BID X 1 day, then 25 mg PO QD X 1 day Patient not taking: Reported on 09/16/2021 09/12/21   Lajean Saver, MD  folic acid (FOLVITE) 1 MG tablet Take 1 tablet (1 mg total) by mouth daily. Patient not taking: Reported on 08/26/2021 08/22/21   Aline August, MD  Multiple Vitamin (MULTIVITAMIN WITH MINERALS) TABS tablet Take 1 tablet by mouth daily. Patient not taking: Reported on 08/26/2021 08/22/21   Aline August, MD  thiamine 100 MG tablet Take 1 tablet (100 mg total) by mouth daily. Patient not taking: Reported on 08/26/2021 08/22/21   Aline August, MD  ferrous sulfate 325 (65 FE) MG tablet Take 1 tablet (325 mg total) by mouth daily with breakfast. Patient not taking: Reported on 09/23/2018 08/09/18 10/09/18  Elodia Florence., MD  pantoprazole (PROTONIX) 40 MG tablet Take 1 tablet (40 mg total) by mouth 2 (two) times daily before a meal. Patient  not taking: Reported on 08/14/2020 06/27/20 08/14/20  Charlynne Cousins, MD      Allergies    Tomato, Aspirin, and Sulfa antibiotics    Review of Systems   Review of Systems  Psychiatric/Behavioral:  Positive for hallucinations and suicidal ideas.   All other systems reviewed and are negative.   Physical Exam Updated Vital Signs BP (!) 143/83   Pulse 79   Temp 97.9 F (36.6 C) (Oral)   Resp 16   SpO2 98%  Physical Exam Vitals and nursing note reviewed.  Constitutional:      General: He is not in acute distress.    Appearance: Normal appearance. He is ill-appearing.     Comments: Chronically ill appearing, clinically intoxicated  HENT:     Head: Normocephalic and atraumatic.  Eyes:     General:        Right eye: No discharge.        Left eye: No discharge.  Cardiovascular:     Rate and Rhythm: Normal rate and regular rhythm.     Heart sounds: No murmur heard.    No friction rub. No gallop.  Pulmonary:     Effort: Pulmonary effort is normal.     Breath sounds: Normal breath sounds.  Abdominal:     General: Bowel sounds are normal.     Palpations: Abdomen is soft.  Skin:    General: Skin is warm and  dry.     Capillary Refill: Capillary refill takes less than 2 seconds.  Neurological:     Mental Status: He is alert and oriented to person, place, and time.  Psychiatric:        Mood and Affect: Mood normal.        Behavior: Behavior normal.     ED Results / Procedures / Treatments   Labs (all labs ordered are listed, but only abnormal results are displayed) Labs Reviewed  COMPREHENSIVE METABOLIC PANEL - Abnormal; Notable for the following components:      Result Value   Calcium 8.4 (*)    Total Protein 8.2 (*)    AST 89 (*)    ALT 55 (*)    All other components within normal limits  ETHANOL - Abnormal; Notable for the following components:   Alcohol, Ethyl (B) 351 (*)    All other components within normal limits  CBC - Abnormal; Notable for the following  components:   Hemoglobin 11.6 (*)    HCT 35.8 (*)    RDW 18.0 (*)    Platelets 129 (*)    All other components within normal limits  SALICYLATE LEVEL - Abnormal; Notable for the following components:   Salicylate Lvl <0.8 (*)    All other components within normal limits  ACETAMINOPHEN LEVEL - Abnormal; Notable for the following components:   Acetaminophen (Tylenol), Serum <10 (*)    All other components within normal limits  RAPID URINE DRUG SCREEN, HOSP PERFORMED  URINALYSIS, ROUTINE W REFLEX MICROSCOPIC    EKG None  Radiology No results found.  Procedures Procedures    Medications Ordered in ED Medications  thiamine (B-1) injection 100 mg (100 mg Intramuscular Given 10/02/21 2030)  acetaminophen (TYLENOL) tablet 650 mg (650 mg Oral Given 10/02/21 2030)    ED Course/ Medical Decision Making/ A&P                           Medical Decision Making Amount and/or Complexity of Data Reviewed Labs: ordered.  Risk OTC drugs. Prescription drug management.   This patient is clinically quite intoxicated. He is well known to this facility. This evening he is endorsing suicidal ideation with plan to "drink himself to death and or starve himself to death".  He endorses some homicidal ideation as well as audio hallucinations as well.  Reports that hallucinations are telling him to "end his life".  Reports some generalized pain all over but denies any recent injury.  And family interpreted lab work including salicylate level, acetaminophen level which are unremarkable.  His CMP is notable for stable elevated transaminases.  CBC with stable anemia, hemoglobin 11.6.  He has minimal thrombocytopenia with platelets 129.  His alcohol is 351 which is significantly elevated.  We will administer thiamine.  At this time patient is stable, and protecting his airway.  His vital signs are stable, he is normotensive, afebrile.  He is under involuntary commitment due to suicidal ideation, homicidal  ideation, and audiovisual hallucination at this time.  He will require TTS evaluation when he is sober.  Rico Sheehan, [facility]: "11:23PM: TTS completed. Quintella Reichert, NP recommends Pt be transferred to [facility] for Facility-Based Crisis after Pt's blood alcohol comes down. I will contact the provider at [facility] and determine if a bed is available."  Patient is clinically stable at this time other than his clinical intoxication.  He will be stable for rehab transfer once he is clinically sober  and bed is available. Placed order for timed recheck of ethanol. Final Clinical Impression(s) / ED Diagnoses Final diagnoses:  None    Rx / DC Orders ED Discharge Orders     None         Dorien Chihuahua 10/02/21 2325    Blanchie Dessert, MD 10/02/21 [id number]

## 2021-10-02 NOTE — ED Notes (Signed)
Pt refuses to sit in chair, pt prefers to lay on the floor d/t back pain.

## 2021-10-03 DIAGNOSIS — F1024 Alcohol dependence with alcohol-induced mood disorder: Secondary | ICD-10-CM

## 2021-10-03 LAB — RAPID URINE DRUG SCREEN, HOSP PERFORMED
Amphetamines: NOT DETECTED
Barbiturates: NOT DETECTED
Benzodiazepines: NOT DETECTED
Cocaine: POSITIVE — AB
Opiates: NOT DETECTED
Tetrahydrocannabinol: NOT DETECTED

## 2021-10-03 LAB — URINALYSIS, ROUTINE W REFLEX MICROSCOPIC
Bilirubin Urine: NEGATIVE
Glucose, UA: NEGATIVE mg/dL
Hgb urine dipstick: NEGATIVE
Ketones, ur: NEGATIVE mg/dL
Leukocytes,Ua: NEGATIVE
Nitrite: NEGATIVE
Protein, ur: NEGATIVE mg/dL
Specific Gravity, Urine: 1.013 (ref 1.005–1.030)
pH: 5 (ref 5.0–8.0)

## 2021-10-03 LAB — ETHANOL: Alcohol, Ethyl (B): 201 mg/dL — ABNORMAL HIGH (ref <10)

## 2021-10-03 MED ORDER — LORAZEPAM 1 MG PO TABS: 0.0000 mg | ORAL_TABLET | Freq: Two times a day (BID) | ORAL | Status: DC

## 2021-10-03 MED ORDER — LORAZEPAM 2 MG/ML IJ SOLN: 0.0000 mg | Freq: Four times a day (QID) | INTRAMUSCULAR | Status: DC

## 2021-10-03 MED ORDER — THIAMINE HCL 100 MG PO TABS
100.0000 mg | ORAL_TABLET | Freq: Every day | ORAL | Status: DC
  Administered 2021-10-03: 100 mg via ORAL
  Filled 2021-10-03: qty 1

## 2021-10-03 MED ORDER — THIAMINE HCL 100 MG/ML IJ SOLN: 100.0000 mg | Freq: Every day | INTRAMUSCULAR | Status: DC

## 2021-10-03 MED ORDER — LORAZEPAM 1 MG PO TABS
0.0000 mg | ORAL_TABLET | Freq: Four times a day (QID) | ORAL | Status: DC
  Administered 2021-10-03: 2 mg via ORAL
  Filled 2021-10-03: qty 2

## 2021-10-03 MED ORDER — LORAZEPAM 2 MG/ML IJ SOLN: 0.0000 mg | Freq: Two times a day (BID) | INTRAMUSCULAR | Status: DC

## 2021-10-03 NOTE — BH Assessment (Signed)
Barry Moore, [facility] at [facility], states history of alcohol withdrawal seizures is exclusionary criteria for [facility]. Other facilities will be contacted for placement.   [name], [facility], [facility] Triage Specialist [phone]
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
## 2021-10-03 NOTE — Discharge Summary (Signed)
[facility] ED Discharge  10/03/2021 10:44 AM Barry Moore  MRN:  [id number]  Principal Problem: Alcohol dependence with alcohol-induced mood disorder [facility]) Discharge Diagnoses: Principal Problem:   Alcohol dependence with alcohol-induced mood disorder ([place])  Clinical Impression:  Final diagnoses:  Alcohol use disorder, severe, dependence ([place])  Alcohol-induced mood disorder (HCC)   Subjective: "I am feeling okay."  Barry Moore is a 56 y.o. male with a history of severe alcohol use disorder and alcohol induced mood disorder who presented to [facility] due to suicidal ideation. Patient is well known to behavioral health services and area emergency departments. On evaluation patient is alert and oriented x 4, calm, and cooperative. Speech is clear and coherent. Mood is depressed and affect is congruent with mood. Thought process is coherent and thought content is logical. Denies auditory and visual hallucinations. No indication that patient is responding to internal stimuli. No evidence of delusional thought content. Denies suicidal ideations. Denies homicidal ideations. Reports daily alcohol use. States that he drinks 4-5 (40 ounce) beers daily. States that yesterday he had more than he typically drinks. Reports history of withdrawal seizures. Currently reports withdrawal symptoms of nausea, diarrhea, tremors. Noted distal tremor. Denies tactile hallucinations.   Pt is a 56 year old male who presents unaccompanied to [place] ED reporting depressive symptoms, suicidal ideation, and alcohol abuse. Pt has a long history of alcohol use, chronic homelessness, and frequent presentations to emergency services. Pt says he came to the ED today due to suicidal ideation, adding "I just want to die. I don't care anymore." He says he believes he is "drinking myself to death." He denies history of suicide attempts. He describes his mood as "deeply, deeply depressed" and acknowledges symptoms including social  withdrawal, loss of interest in usual pleasures, fatigue, irritability, decreased concentration, decreased sleep, decreased appetite and feelings of guilt, worthlessness and hopelessness. He reports vague thoughts of harming "a couple of people" with no plan or intent. He denies history of aggressive behavior. He denies current auditory or visual hallucinations but does report episodes of "hearing someone calling my name."    Pt describes a long history of alcohol use. He has presented to area EDs with high blood alcohol levels and today BAL=351. He says he has he has a history of blackouts, withdrawal symptoms, and alcohol withdrawal seizures. He has a history of using cocaine but denies recent use. Pt's urine drug screen is in process.   Pt identifies homelessness as his primary stressor. He is unemployed and has no Pensions consultant. He cannot identify any family or friends who are supportive. He says he has no outpatient mental health providers. He denies legal problems. He denies access to firearms. He says he is prescribed psychiatric medication but has not been taking them. He cannot remember when he was last psychiatrically hospitalized and Pt's medical record indicates he was inpatient at [facility] 04/23-05/05/2021.  ED Assessment Time Calculation: Start Time: 1005 Stop Time: 1030 Total Time in Minutes (Assessment Completion): 25   Past Psychiatric History:  severe alcohol use disorder and alcohol induced mood disorder   Past Medical History:  Past Medical History:  Diagnosis Date   Alcoholic hepatitis    Depression    ETOH abuse    Gastric bleed 08/2018   Hypertension    Pancreatitis    Seizures ([place])    Suicidal behavior     Past Surgical History:  Procedure Laterality Date   BIOPSY  08/05/2018   Procedure: BIOPSY;  Surgeon: Justice Britain  Brooke Bonito., MD;  Location: [facility];  Service: Gastroenterology;;   BIOPSY  08/07/2018   Procedure: BIOPSY;  Surgeon: Thornton Park,  MD;  Location: [facility] ENDOSCOPY;  Service: Gastroenterology;;   BIOPSY  01/02/2019   Procedure: BIOPSY;  Surgeon: Gatha Mayer, MD;  Location: [facility] ENDOSCOPY;  Service: Endoscopy;;   COLONOSCOPY WITH PROPOFOL N/A 08/07/2018   Procedure: COLONOSCOPY WITH PROPOFOL;  Surgeon: Thornton Park, MD;  Location: [facility];  Service: Gastroenterology;  Laterality: N/A;   ESOPHAGOGASTRODUODENOSCOPY N/A 01/02/2019   Procedure: ESOPHAGOGASTRODUODENOSCOPY (EGD);  Surgeon: Gatha Mayer, MD;  Location: [facility];  Service: Endoscopy;  Laterality: N/A;   ESOPHAGOGASTRODUODENOSCOPY N/A 07/03/2019   Procedure: ESOPHAGOGASTRODUODENOSCOPY (EGD);  Surgeon: Wonda Horner, MD;  Location: [facility]OS[facility];  Service: Endoscopy;  Laterality: N/A;   ESOPHAGOGASTRODUODENOSCOPY N/A 06/26/2020   Procedure: ESOPHAGOGASTRODUODENOSCOPY (EGD);  Surgeon: Arta Silence, MD;  Location: [facility] ENDOSCOPY;  Service: Endoscopy;  Laterality: N/A;   ESOPHAGOGASTRODUODENOSCOPY (EGD) WITH PROPOFOL N/A 11/02/2016   Procedure: ESOPHAGOGASTRODUODENOSCOPY (EGD) WITH PROPOFOL;  Surgeon: Carol Ada, MD;  Location: [facility] ENDOSCOPY;  Service: Endoscopy;  Laterality: N/A;   ESOPHAGOGASTRODUODENOSCOPY (EGD) WITH PROPOFOL N/A 08/05/2018   Procedure: ESOPHAGOGASTRODUODENOSCOPY (EGD) WITH PROPOFOL;  Surgeon: Rush Landmark Telford Nab., MD;  Location: [facility];  Service: Gastroenterology;  Laterality: N/A;   ESOPHAGOGASTRODUODENOSCOPY (EGD) WITH PROPOFOL N/A 07/14/2019   Procedure: ESOPHAGOGASTRODUODENOSCOPY (EGD) WITH PROPOFOL;  Surgeon: Otis Brace, MD;  Location: [facility] ENDOSCOPY;  Service: Gastroenterology;  Laterality: N/A;   HERNIA REPAIR     LEG SURGERY     POLYPECTOMY  08/07/2018   Procedure: POLYPECTOMY;  Surgeon: Thornton Park, MD;  Location: [facility] ENDOSCOPY;  Service: Gastroenterology;;   Family History:  Family History  Problem Relation Age of Onset   Diabetes Mother    Alcoholism Mother    Emphysema Father    Lung cancer Father     Alcoholism Father     Social History:  Social History   Substance and Sexual Activity  Alcohol Use Yes   Comment: 5+ BEERS DAILY, Patient does not know how much he drank tonight     Social History   Substance and Sexual Activity  Drug Use Not Currently   Frequency: 3.0 times per week   Types: Cocaine    Social History   Socioeconomic History   Marital status: Divorced    Spouse name: Not on file   Number of children: Not on file   Years of education: Not on file   Highest education level: Not on file  Occupational History   Not on file  Tobacco Use   Smoking status: Every Day    Packs/day: 1.00    Types: Cigarettes   Smokeless tobacco: Never  Vaping Use   Vaping Use: Never used  Substance and Sexual Activity   Alcohol use: Yes    Comment: 5+ BEERS DAILY, Patient does not know how much he drank tonight   Drug use: Not Currently    Frequency: 3.0 times per week    Types: Cocaine   Sexual activity: Not Currently  Other Topics Concern   Not on file  Social History Narrative   Not on file   Social Determinants of Health   Financial Resource Strain: Not on file  Food Insecurity: Not on file  Transportation Needs: Not on file  Physical Activity: Not on file  Stress: Not on file  Social Connections: Not on file    Tobacco Cessation:  A prescription for an FDA-approved tobacco cessation medication was offered at discharge  and the patient refused  Current Medications: Current Facility-Administered Medications  Medication Dose Route Frequency Provider Last Rate Last Admin   LORazepam (ATIVAN) injection 0-4 mg  0-4 mg Intravenous Q6H Lacretia Leigh, MD       Or   LORazepam (ATIVAN) tablet 0-4 mg  0-4 mg Oral Q6H Lacretia Leigh, MD   2 mg at 10/03/21 0837   [START ON 10/05/2021] LORazepam (ATIVAN) injection 0-4 mg  0-4 mg Intravenous Erskine Squibb, MD       Or   Derrill Memo ON 10/05/2021] LORazepam (ATIVAN) tablet 0-4 mg  0-4 mg Oral Q12H Lacretia Leigh, MD        thiamine tablet 100 mg  100 mg Oral Daily Lacretia Leigh, MD   100 mg at 10/03/21 2703   Or   thiamine (B-1) injection 100 mg  100 mg Intravenous Daily Lacretia Leigh, MD       Current Outpatient Medications  Medication Sig Dispense Refill   amLODipine (NORVASC) 5 MG tablet Take 1 tablet (5 mg total) by mouth daily. (Patient not taking: Reported on 08/26/2021) 30 tablet 0   chlordiazePOXIDE (LIBRIUM) 25 MG capsule 25 mg PO TID x 1 day, then 25 mg PO BID X 1 day, then 25 mg PO QD X 1 day (Patient not taking: Reported on 09/16/2021) 6 capsule 0   folic acid (FOLVITE) 1 MG tablet Take 1 tablet (1 mg total) by mouth daily. (Patient not taking: Reported on 08/26/2021) 30 tablet 0   Multiple Vitamin (MULTIVITAMIN WITH MINERALS) TABS tablet Take 1 tablet by mouth daily. (Patient not taking: Reported on 08/26/2021)     thiamine 100 MG tablet Take 1 tablet (100 mg total) by mouth daily. (Patient not taking: Reported on 08/26/2021) 30 tablet 0   PTA Medications: (Not in a hospital admission)   [place] Scale:  [place] ED from 10/02/2021 in [place] DEPT ED from 09/25/2021 in [place] DEPT ED from 09/16/2021 in [place] DEPT  C-SSRS RISK CATEGORY High Risk High Risk Moderate Risk       Musculoskeletal: Strength & Muscle Tone: within normal limits Gait & Station: normal Patient leans: N/A  Psychiatric Specialty Exam: Presentation  General Appearance: Disheveled  Eye Contact:Fair  Speech:Clear and Coherent; Normal Rate  Speech Volume:Normal  Handedness:Right   Mood and Affect  Mood:Depressed  Affect:Congruent   Thought Process  Thought Processes:Coherent; Linear  Descriptions of Associations:Intact  Orientation:Full (Time, Place and Person)  Thought Content:Logical  History of Schizophrenia/Schizoaffective disorder:No  Duration of Psychotic  Symptoms:N/A  Hallucinations:Hallucinations: None  Ideas of Reference:None  Suicidal Thoughts:Suicidal Thoughts: No  Homicidal Thoughts:Homicidal Thoughts: No   Sensorium  Memory:Immediate Good; Recent Good; Remote Good  Judgment:Intact  Insight:Present   Executive Functions  Concentration:Good  Attention Span:Good  [place]  Language:Good   Psychomotor Activity  Psychomotor Activity:Psychomotor Activity: Tremor   Assets  Assets:Communication Skills; Desire for Improvement; Housing   Sleep  Sleep:Sleep: Good    Physical Exam: Physical Exam ROS Blood pressure (!) 163/91, pulse 81, temperature 97.9 F (36.6 C), temperature source Oral, resp. rate 18, SpO2 100 %. There is no height or weight on file to calculate BMI.   Demographic Factors:  Male, Caucasian, and Low socioeconomic status  Loss Factors: Decline in physical health and Financial problems/change in socioeconomic status  Historical Factors: Prior suicide attempts and Family history of mental illness or substance abuse  Risk Reduction Factors:   Religious beliefs about  death  Continued Clinical Symptoms:  Alcohol/Substance Abuse/Dependencies Previous Psychiatric Diagnoses and Treatments Medical Diagnoses and Treatments/Surgeries  Cognitive Features That Contribute To Risk:  None    Suicide Risk:  Minimal: No identifiable suicidal ideation.  Patients presenting with no risk factors but with morbid ruminations; may be classified as minimal risk based on the severity of the depressive symptoms   Medical Decision Making: Patient is well known to behavioral health services. Patient often presents to the ED while intoxicated. This morning the patient denies suicidal ideations. He does report some withdrawal symptoms.   Problem 1: Alcohol intoxication with alcohol induced mood disorder   Disposition: No evidence of imminent risk to self or others at present.    Patient does not meet criteria for psychiatric inpatient admission. Supportive therapy provided about ongoing stressors. Discussed crisis plan, support from social network, calling 911, coming to the Emergency Department, and calling Suicide Hotline.   Discussed discharge plan with EDP, Dr. Zenia Resides.  Rozetta Nunnery, NP 10/03/2021, 10:44 AM

## 2021-10-03 NOTE — ED Provider Notes (Addendum)
Emergency Medicine Observation Re-evaluation Note  Barry Moore is a 56 y.o. male, seen on rounds today.  Pt initially presented to the ED for complaints of Suicidal Currently, the patient is sleeping in the hallway.  Awakens to voice.  Physical Exam  BP (!) 163/91 (BP Location: Left Arm)   Pulse 81   Temp 97.9 F (36.6 C) (Oral)   Resp 18   SpO2 100%  Physical Exam General: No acute distress  Psych: No SI or HI  ED Course / MDM  EKG:   I have reviewed the labs performed to date as well as medications administered while in observation.  Recent changes in the last 24 hours include nothing.  Plan  Current plan is for patient has a CIWA score of 13.  Has been medicated with 2 mg of Ativan per protocol.  Patient's alcohol level approximately 4 hours ago was 209.  Will monitor here.  Will discharge when CIWA score decreases Barry Moore is under involuntary commitment.      Lacretia Leigh, MD 10/03/21 8527    Lacretia Leigh, MD 10/03/21 1125

## 2021-10-03 NOTE — Progress Notes (Signed)
Per Quintella Reichert, NP, patient meets criteria for inpatient treatment. There are no available beds at [facility] today. CSW faxed referrals to the following facilities for review:  [facility] [address] [place] [address] [phone] --  [facility]  Pending - Request Sent N/A [address] [place] [address] [phone] --  [address] [place] [address] [phone] --  [place] [address] [place] [address] [phone] --  [place]  Pending - Request Sent N/A [place], [place] [address] [phone] --  [place] [address] [place], [place] [address] [phone] --  [place] [address] [place] [address] [phone] --  [facility]  Pending - Request Sent N/A [address], [place] [address] [place] N/A [address], [place] [address] [phone] --  [facility]  Pending - Request Sent N/A [address] [place] [address] [phone] --  [facility]  Pending - Request Sent N/A [address] [place] [address] [phone] --  [facility]  Pending - Request Sent N/A [address] [place] [address] [phone] --  [facility]  Pending - Request Sent N/A [place], [place] [address] [phone] --  [facility]  Pending - Request Sent N/A [address], [place]  [address] [phone] --  [facility]  Pending - Request Sent N/A [address] [place] [address] [phone] --  [facility]  Pending - Request Sent N/A [address] [place] [address] [phone] --   TTS will continue to seek bed placement.  Glennie Isle, MSW, [place] Phone: [phone] Disposition/TOC

## 2021-10-07 ENCOUNTER — Encounter (HOSPITAL_COMMUNITY): Payer: Self-pay

## 2021-10-07 ENCOUNTER — Other Ambulatory Visit: Payer: Self-pay

## 2021-10-07 ENCOUNTER — Emergency Department (HOSPITAL_COMMUNITY)
Admission: EM | Admit: 2021-10-07 | Discharge: 2021-10-07 | Disposition: A | Discharge status: Home / Self Care | Payer: Medicaid - Out of State | Attending: Emergency Medicine | Admitting: Emergency Medicine

## 2021-10-07 DIAGNOSIS — R4781 Slurred speech: Secondary | ICD-10-CM | POA: Diagnosis present

## 2021-10-07 DIAGNOSIS — F1010 Alcohol abuse, uncomplicated: Secondary | ICD-10-CM | POA: Diagnosis not present

## 2021-10-07 DIAGNOSIS — Y909 Presence of alcohol in blood, level not specified: Secondary | ICD-10-CM | POA: Diagnosis not present

## 2021-10-07 DIAGNOSIS — R45851 Suicidal ideations: Secondary | ICD-10-CM | POA: Diagnosis not present

## 2021-10-07 LAB — URINALYSIS, ROUTINE W REFLEX MICROSCOPIC
Bilirubin Urine: NEGATIVE
Glucose, UA: NEGATIVE mg/dL
Ketones, ur: NEGATIVE mg/dL
Leukocytes,Ua: NEGATIVE
Nitrite: NEGATIVE
Protein, ur: NEGATIVE mg/dL
Specific Gravity, Urine: 1.005 (ref 1.005–1.030)
pH: 5 (ref 5.0–8.0)

## 2021-10-07 LAB — RAPID URINE DRUG SCREEN, HOSP PERFORMED
Amphetamines: NOT DETECTED
Barbiturates: NOT DETECTED
Benzodiazepines: NOT DETECTED
Cocaine: NOT DETECTED
Opiates: NOT DETECTED
Tetrahydrocannabinol: NOT DETECTED

## 2021-10-07 NOTE — ED Provider Notes (Addendum)
[facility] DEPT Provider Note   CSN: [id number] Arrival date & time: 10/07/21  4098     History  Chief Complaint  Patient presents with   Alcohol Intoxication   Suicidal    Barry Moore is a 56 y.o. male.  56 year old male with history of alcohol abuse chronic suicidal ideations present via EMS after being found outside of [place] saying he had suicidal ideations.  Patient seen by myself recently for same.  After patient became sober he recanted them and was discharged.  Does admit to drinking alcohol today.  Patient CBG per EMS was appropriate.  He does not have a plan with his suicidal ideations       Home Medications Prior to Admission medications   Medication Sig Start Date End Date Taking? Authorizing Provider  amLODipine (NORVASC) 5 MG tablet Take 1 tablet (5 mg total) by mouth daily. Patient not taking: Reported on 08/26/2021 08/22/21   Aline August, MD  chlordiazePOXIDE (LIBRIUM) 25 MG capsule 25 mg PO TID x 1 day, then 25 mg PO BID X 1 day, then 25 mg PO QD X 1 day Patient not taking: Reported on 09/16/2021 09/12/21   Lajean Saver, MD  folic acid (FOLVITE) 1 MG tablet Take 1 tablet (1 mg total) by mouth daily. Patient not taking: Reported on 08/26/2021 08/22/21   Aline August, MD  Multiple Vitamin (MULTIVITAMIN WITH MINERALS) TABS tablet Take 1 tablet by mouth daily. Patient not taking: Reported on 08/26/2021 08/22/21   Aline August, MD  thiamine 100 MG tablet Take 1 tablet (100 mg total) by mouth daily. Patient not taking: Reported on 08/26/2021 08/22/21   Aline August, MD  ferrous sulfate 325 (65 FE) MG tablet Take 1 tablet (325 mg total) by mouth daily with breakfast. Patient not taking: Reported on 09/23/2018 08/09/18 10/09/18  Elodia Florence., MD  pantoprazole (PROTONIX) 40 MG tablet Take 1 tablet (40 mg total) by mouth 2 (two) times daily before a meal. Patient not taking: Reported on 08/14/2020 06/27/20 08/14/20  Charlynne Cousins,  MD      Allergies    Tomato, Aspirin, and Sulfa antibiotics    Review of Systems   Review of Systems  Unable to perform ROS: Other    Physical Exam Updated Vital Signs BP (!) 143/82 (BP Location: Left Arm)   Pulse 87   Temp 98.6 F (37 C) (Oral)   Resp 18   Ht 1.702 m ('5\' 7"'$ )   Wt 69.4 kg   SpO2 96%   BMI 23.96 kg/m  Physical Exam Vitals and nursing note reviewed.  Constitutional:      General: He is not in acute distress.    Appearance: Normal appearance. He is well-developed. He is not toxic-appearing.  HENT:     Head: Normocephalic and atraumatic.  Eyes:     General: Lids are normal.     Conjunctiva/sclera: Conjunctivae normal.     Pupils: Pupils are equal, round, and reactive to light.  Neck:     Thyroid: No thyroid mass.     Trachea: No tracheal deviation.  Cardiovascular:     Rate and Rhythm: Normal rate and regular rhythm.     Heart sounds: Normal heart sounds. No murmur heard.    No gallop.  Pulmonary:     Effort: Pulmonary effort is normal. No respiratory distress.     Breath sounds: Normal breath sounds. No stridor. No decreased breath sounds, wheezing, rhonchi or rales.  Abdominal:  General: There is no distension.     Palpations: Abdomen is soft.     Tenderness: There is no abdominal tenderness. There is no rebound.  Musculoskeletal:        General: No tenderness. Normal range of motion.     Cervical back: Normal range of motion and neck supple.  Skin:    General: Skin is warm and dry.     Findings: No abrasion or rash.  Neurological:     Mental Status: He is alert and oriented to person, place, and time. Mental status is at baseline.     GCS: GCS eye subscore is 4. GCS verbal subscore is 5. GCS motor subscore is 6.     Cranial Nerves: No cranial nerve deficit.     Sensory: No sensory deficit.     Motor: Motor function is intact.  Psychiatric:        Attention and Perception: Attention normal.        Mood and Affect: Affect is flat.         Speech: Speech is slurred.        Behavior: Behavior is withdrawn.        Thought Content: Thought content includes suicidal ideation. Thought content does not include suicidal plan.     ED Results / Procedures / Treatments   Labs (all labs ordered are listed, but only abnormal results are displayed) Labs Reviewed - No data to display  EKG None  Radiology No results found.  Procedures Procedures    Medications Ordered in ED Medications - No data to display  ED Course/ Medical Decision Making/ A&P                           Medical Decision Making Amount and/or Complexity of Data Reviewed Labs: ordered.   Patient here with frequent ED visits for suicidal ideations when he is intoxicated.  Patient does not have a plan at this time.  Patient will be allowed to sober up and then be reassessed  2:57 PM Patient will be allowed to sober up and will be discharged as he has chronic suicidal ideations.  Will sign out to next provider      Final Clinical Impression(s) / ED Diagnoses Final diagnoses:  None    Rx / DC Orders ED Discharge Orders     None         Lacretia Leigh, MD 10/07/21 1016    Lacretia Leigh, MD 10/07/21 1458

## 2021-10-07 NOTE — ED Notes (Signed)
Pt states understanding of dc instructions, importance of follow up.  Pt denies questions or concerns upon dc. Pt declined wheelchair assistance upon dc. Pt ambulated out of ed w/ steady gait. No belongings left in room upon dc.  

## 2021-10-07 NOTE — ED Triage Notes (Signed)
Pt bib ems for ETOH and SI.  Pt states "I wanna die". "I'm gonna drink myself to death".

## 2021-10-07 NOTE — ED Notes (Signed)
Pt was able to walk to the bathroom and back to his bed, pt gate was a little unsteady but no hands on assistance needed pt is alert

## 2021-10-07 NOTE — Discharge Instructions (Addendum)
Call your primary care doctor or specialist as discussed in the next 2-3 days.   Return immediately back to the ER if:  Your symptoms worsen within the next 12-24 hours. You develop new symptoms such as new fevers, persistent vomiting, new pain, shortness of breath, or new weakness or numbness, or if you have any other concerns.  

## 2021-10-07 NOTE — ED Notes (Signed)
Pt given ham sandwich and glass of water.  Lunch tray ordered, awaiting arrival.

## 2021-10-07 NOTE — ED Provider Notes (Addendum)
Patient is ambulatory now.  Appears clinically sober.  Discharged home in stable condition.  Provided resources for behavioral health center.   Luna Fuse, MD 10/07/21 Olena Leatherwood, MD 10/07/21 [phone]

## 2021-10-07 NOTE — ED Notes (Signed)
Pt given meal tray.

## 2021-10-17 ENCOUNTER — Encounter (HOSPITAL_COMMUNITY): Payer: Self-pay | Admitting: Emergency Medicine

## 2021-10-17 ENCOUNTER — Emergency Department (HOSPITAL_COMMUNITY)
Admission: EM | Admit: 2021-10-17 | Discharge: 2021-10-17 | Disposition: A | Discharge status: Home / Self Care | Payer: Medicaid - Out of State | Attending: Emergency Medicine | Admitting: Emergency Medicine

## 2021-10-17 DIAGNOSIS — Z79899 Other long term (current) drug therapy: Secondary | ICD-10-CM | POA: Insufficient documentation

## 2021-10-17 DIAGNOSIS — M25551 Pain in right hip: Secondary | ICD-10-CM | POA: Insufficient documentation

## 2021-10-17 DIAGNOSIS — F10120 Alcohol abuse with intoxication, uncomplicated: Secondary | ICD-10-CM | POA: Insufficient documentation

## 2021-10-17 DIAGNOSIS — F10129 Alcohol abuse with intoxication, unspecified: Secondary | ICD-10-CM | POA: Diagnosis present

## 2021-10-17 DIAGNOSIS — G40909 Epilepsy, unspecified, not intractable, without status epilepticus: Secondary | ICD-10-CM | POA: Insufficient documentation

## 2021-10-17 DIAGNOSIS — M25552 Pain in left hip: Secondary | ICD-10-CM | POA: Insufficient documentation

## 2021-10-17 DIAGNOSIS — Z91148 Patient's other noncompliance with medication regimen for other reason: Secondary | ICD-10-CM

## 2021-10-17 DIAGNOSIS — I10 Essential (primary) hypertension: Secondary | ICD-10-CM | POA: Diagnosis not present

## 2021-10-17 DIAGNOSIS — F10920 Alcohol use, unspecified with intoxication, uncomplicated: Secondary | ICD-10-CM

## 2021-10-17 NOTE — Discharge Instructions (Addendum)
You were seen in the emergency department for alcohol intoxication and a seizure.  It is incredibly important you are taking your seizure medication. I've attached some resources for substance abuse in the area.

## 2021-10-17 NOTE — ED Provider Notes (Signed)
[facility] DEPT Provider Note   CSN: [id number] Arrival date & time: 10/17/21  1845     History  Chief Complaint  Patient presents with   Seizures   Alcohol Intoxication    Barry Moore is a 56 y.o. male who presents the emergency department for alcohol intoxication and concern for seizure.  Patient is known epileptic and is noncompliant with his medication per his family.  Patient well-known to this department with 12 ER visits in the past 3 months for similar symptoms.   Seizures Alcohol Intoxication       Home Medications Prior to Admission medications   Medication Sig Start Date End Date Taking? Authorizing Provider  amLODipine (NORVASC) 5 MG tablet Take 1 tablet (5 mg total) by mouth daily. Patient not taking: Reported on 08/26/2021 08/22/21   Aline August, MD  chlordiazePOXIDE (LIBRIUM) 25 MG capsule 25 mg PO TID x 1 day, then 25 mg PO BID X 1 day, then 25 mg PO QD X 1 day Patient not taking: Reported on 09/16/2021 09/12/21   Lajean Saver, MD  folic acid (FOLVITE) 1 MG tablet Take 1 tablet (1 mg total) by mouth daily. Patient not taking: Reported on 08/26/2021 08/22/21   Aline August, MD  Multiple Vitamin (MULTIVITAMIN WITH MINERALS) TABS tablet Take 1 tablet by mouth daily. Patient not taking: Reported on 08/26/2021 08/22/21   Aline August, MD  thiamine 100 MG tablet Take 1 tablet (100 mg total) by mouth daily. Patient not taking: Reported on 08/26/2021 08/22/21   Aline August, MD  ferrous sulfate 325 (65 FE) MG tablet Take 1 tablet (325 mg total) by mouth daily with breakfast. Patient not taking: Reported on 09/23/2018 08/09/18 10/09/18  Elodia Florence., MD  pantoprazole (PROTONIX) 40 MG tablet Take 1 tablet (40 mg total) by mouth 2 (two) times daily before a meal. Patient not taking: Reported on 08/14/2020 06/27/20 08/14/20  Charlynne Cousins, MD      Allergies    Tomato, Aspirin, and Sulfa antibiotics    Review of Systems    Review of Systems  Musculoskeletal:  Positive for arthralgias.       Bilateral hip soreness  Neurological:  Positive for seizures.  Psychiatric/Behavioral:  Negative for self-injury and suicidal ideas.   All other systems reviewed and are negative.   Physical Exam Updated Vital Signs BP 130/68   Pulse 80   Temp 98 F (36.7 C)   Resp 18   SpO2 94%  Physical Exam Vitals and nursing note reviewed.  Constitutional:      General: He is awake.     Comments: Appears clinically intoxicated, but awake and able to answer all questions. Disheveled appearance.   HENT:     Head: Normocephalic and atraumatic.  Eyes:     Conjunctiva/sclera: Conjunctivae normal.  Pulmonary:     Effort: Pulmonary effort is normal. No respiratory distress.  Musculoskeletal:     Comments: Bilateral hip tenderness to palpation without focal tenderness or deformities. Pelvis stable.   Skin:    General: Skin is warm and dry.  Psychiatric:        Mood and Affect: Mood normal.        Behavior: Behavior normal.     ED Results / Procedures / Treatments   Labs (all labs ordered are listed, but only abnormal results are displayed) Labs Reviewed - No data to display  EKG None  Radiology No results found.  Procedures Procedures    Medications  Ordered in ED Medications - No data to display  ED Course/ Medical Decision Making/ A&P                           Medical Decision Making  Patient is a 56 year old male with history of hypertension, seizures, alcohol abuse, pancreatitis who presents the emergency department complaining of alcohol intoxication.  Family reports that he is noncompliant with his seizure medications and has concerns he had a seizure earlier today.  On chart review, patient is very well-known to this department with 12 visits to the ER in 3 months for similar symptoms.  On my exam he does not report any suicidal ideation.  Admits to alcohol use today.  Is not sure if he had a  seizure or not.  Complaining of bilateral hip pain, but pelvis is stable there is no focal tenderness or deformities.  He thinks this is likely soreness due to sleeping on his side.  While he clinically appears intoxicated, he is awake and able to answer all questions without difficulty.  Normal vital signs.  Patient in no acute distress.  Do not believe he requires admission today.  He has been given 5 hours of observation to sober up, and on reevaluation he is awake and eating dinner.  Will discharge to home and give resources for substance abuse.        Final Clinical Impression(s) / ED Diagnoses Final diagnoses:  Alcoholic intoxication without complication ([place])  Noncompliance with medications    Rx / DC Orders ED Discharge Orders     None      Portions of this report may have been transcribed using voice recognition software. Every effort was made to ensure accuracy; however, inadvertent computerized transcription errors may be present.    Estill Cotta 10/17/21 2348    Sherwood Gambler, MD 10/19/21 2259

## 2021-10-17 NOTE — ED Triage Notes (Signed)
Pt to ER via EMS with reports of seizure and ETOH intoxication.  Pt is noncompliant with meds and well known to EMS and this facility.  Per EMS pt is uncooperative.

## 2021-10-21 ENCOUNTER — Encounter (HOSPITAL_COMMUNITY): Payer: Self-pay

## 2021-10-21 ENCOUNTER — Other Ambulatory Visit: Payer: Self-pay

## 2021-10-21 ENCOUNTER — Emergency Department (HOSPITAL_COMMUNITY)
Admission: EM | Admit: 2021-10-21 | Discharge: 2021-10-22 | Disposition: A | Discharge status: Home / Self Care | Payer: Medicaid - Out of State | Attending: Emergency Medicine | Admitting: Emergency Medicine

## 2021-10-21 DIAGNOSIS — Y908 Blood alcohol level of 240 mg/100 ml or more: Secondary | ICD-10-CM | POA: Diagnosis not present

## 2021-10-21 DIAGNOSIS — F10120 Alcohol abuse with intoxication, uncomplicated: Secondary | ICD-10-CM | POA: Diagnosis not present

## 2021-10-21 DIAGNOSIS — Z79899 Other long term (current) drug therapy: Secondary | ICD-10-CM | POA: Insufficient documentation

## 2021-10-21 DIAGNOSIS — I10 Essential (primary) hypertension: Secondary | ICD-10-CM | POA: Insufficient documentation

## 2021-10-21 DIAGNOSIS — F10920 Alcohol use, unspecified with intoxication, uncomplicated: Secondary | ICD-10-CM

## 2021-10-21 LAB — CBC WITH DIFFERENTIAL/PLATELET
Abs Immature Granulocytes: 0.01 10*3/uL (ref 0.00–0.07)
Basophils Absolute: 0.1 10*3/uL (ref 0.0–0.1)
Basophils Relative: 2 %
Eosinophils Absolute: 0.1 10*3/uL (ref 0.0–0.5)
Eosinophils Relative: 3 %
HCT: 35.7 % — ABNORMAL LOW (ref 39.0–52.0)
Hemoglobin: 11.7 g/dL — ABNORMAL LOW (ref 13.0–17.0)
Immature Granulocytes: 0 %
Lymphocytes Relative: 42 %
Lymphs Abs: 1.7 10*3/uL (ref 0.7–4.0)
MCH: 27.6 pg (ref 26.0–34.0)
MCHC: 32.8 g/dL (ref 30.0–36.0)
MCV: 84.2 fL (ref 80.0–100.0)
Monocytes Absolute: 0.7 10*3/uL (ref 0.1–1.0)
Monocytes Relative: 17 %
Neutro Abs: 1.4 10*3/uL — ABNORMAL LOW (ref 1.7–7.7)
Neutrophils Relative %: 36 %
Platelets: 86 10*3/uL — ABNORMAL LOW (ref 150–400)
RBC: 4.24 MIL/uL (ref 4.22–5.81)
RDW: 18.1 % — ABNORMAL HIGH (ref 11.5–15.5)
WBC: 4 10*3/uL (ref 4.0–10.5)
nRBC: 0 % (ref 0.0–0.2)

## 2021-10-21 LAB — COMPREHENSIVE METABOLIC PANEL
ALT: 67 U/L — ABNORMAL HIGH (ref 0–44)
AST: 178 U/L — ABNORMAL HIGH (ref 15–41)
Albumin: 4.1 g/dL (ref 3.5–5.0)
Alkaline Phosphatase: 124 U/L (ref 38–126)
Anion gap: 10 (ref 5–15)
BUN: 6 mg/dL (ref 6–20)
CO2: 24 mmol/L (ref 22–32)
Calcium: 8.6 mg/dL — ABNORMAL LOW (ref 8.9–10.3)
Chloride: 106 mmol/L (ref 98–111)
Creatinine, Ser: 0.73 mg/dL (ref 0.61–1.24)
GFR, Estimated: >60 mL/min (ref >60)
Glucose, Bld: 99 mg/dL (ref 70–99)
Potassium: 3.6 mmol/L (ref 3.5–5.1)
Sodium: 140 mmol/L (ref 135–145)
Total Bilirubin: 0.4 mg/dL (ref 0.3–1.2)
Total Protein: 7.9 g/dL (ref 6.5–8.1)

## 2021-10-21 LAB — RAPID URINE DRUG SCREEN, HOSP PERFORMED
Amphetamines: NOT DETECTED
Barbiturates: NOT DETECTED
Benzodiazepines: NOT DETECTED
Cocaine: NOT DETECTED
Opiates: NOT DETECTED
Tetrahydrocannabinol: NOT DETECTED

## 2021-10-21 LAB — ETHANOL: Alcohol, Ethyl (B): 404 mg/dL (ref <10)

## 2021-10-21 NOTE — ED Provider Notes (Signed)
[facility] DEPT Provider Note   CSN: [id number] Arrival date & time: 10/21/21  2211     History {Add pertinent medical, surgical, social history, OB history to HPI:1} Chief Complaint  Patient presents with   Seizures    Barry Moore is a 56 y.o. male.  The history is provided by the patient and medical records.  Seizures   56 year old male with history of hypertension, alcohol abuse, cocaine abuse, depression, presenting to the ED with possible seizure.  EMS was called to sidewalk in front of [employer] due to reported seizure.  This was not witnessed by anyone and patient unsure exactly how long this occurred.  He is awake and alert on arrival to the ED.  Does admit to alcohol use today, unable to quantify.  Home Medications Prior to Admission medications   Medication Sig Start Date End Date Taking? Authorizing Provider  amLODipine (NORVASC) 5 MG tablet Take 1 tablet (5 mg total) by mouth daily. Patient not taking: Reported on 08/26/2021 08/22/21   Aline August, MD  chlordiazePOXIDE (LIBRIUM) 25 MG capsule 25 mg PO TID x 1 day, then 25 mg PO BID X 1 day, then 25 mg PO QD X 1 day Patient not taking: Reported on 09/16/2021 09/12/21   Lajean Saver, MD  folic acid (FOLVITE) 1 MG tablet Take 1 tablet (1 mg total) by mouth daily. Patient not taking: Reported on 08/26/2021 08/22/21   Aline August, MD  Multiple Vitamin (MULTIVITAMIN WITH MINERALS) TABS tablet Take 1 tablet by mouth daily. Patient not taking: Reported on 08/26/2021 08/22/21   Aline August, MD  thiamine 100 MG tablet Take 1 tablet (100 mg total) by mouth daily. Patient not taking: Reported on 08/26/2021 08/22/21   Aline August, MD  ferrous sulfate 325 (65 FE) MG tablet Take 1 tablet (325 mg total) by mouth daily with breakfast. Patient not taking: Reported on 09/23/2018 08/09/18 10/09/18  Elodia Florence., MD  pantoprazole (PROTONIX) 40 MG tablet Take 1 tablet (40 mg total) by mouth 2  (two) times daily before a meal. Patient not taking: Reported on 08/14/2020 06/27/20 08/14/20  Charlynne Cousins, MD      Allergies    Tomato, Aspirin, and Sulfa antibiotics    Review of Systems   Review of Systems  Neurological:  Positive for seizures (poss seizure).  All other systems reviewed and are negative.   Physical Exam Updated Vital Signs BP (!) 140/88   Pulse 91   Temp 97.9 F (36.6 C) (Oral)   Resp 20   SpO2 94%   Physical Exam Vitals and nursing note reviewed.  Constitutional:      Appearance: He is well-developed.     Comments: Disheveled appearing  HENT:     Head: Normocephalic and atraumatic.     Comments: No visible head trauma    Mouth/Throat:     Comments: Breath smells of EtOH, no tongue or dental injury Eyes:     Conjunctiva/sclera: Conjunctivae normal.     Pupils: Pupils are equal, round, and reactive to light.  Cardiovascular:     Rate and Rhythm: Normal rate and regular rhythm.     Heart sounds: Normal heart sounds.  Pulmonary:     Effort: Pulmonary effort is normal.     Breath sounds: Normal breath sounds.  Abdominal:     General: Bowel sounds are normal.     Palpations: Abdomen is soft.  Musculoskeletal:        General:  Normal range of motion.     Cervical back: Normal range of motion.  Skin:    General: Skin is warm and dry.  Neurological:     Mental Status: He is alert and oriented to person, place, and time.     Comments: Awake, alert, able to follow commands and answer questions     ED Results / Procedures / Treatments   Labs (all labs ordered are listed, but only abnormal results are displayed) Labs Reviewed  CBC WITH DIFFERENTIAL/PLATELET - Abnormal; Notable for the following components:      Result Value   Hemoglobin 11.7 (*)    HCT 35.7 (*)    RDW 18.1 (*)    Platelets 86 (*)    Neutro Abs 1.4 (*)    All other components within normal limits  COMPREHENSIVE METABOLIC PANEL - Abnormal; Notable for the following  components:   Calcium 8.6 (*)    AST 178 (*)    ALT 67 (*)    All other components within normal limits  ETHANOL - Abnormal; Notable for the following components:   Alcohol, Ethyl (B) 404 (*)    All other components within normal limits  RAPID URINE DRUG SCREEN, HOSP PERFORMED    EKG None  Radiology No results found.  Procedures Procedures  {Document cardiac monitor, telemetry assessment procedure when appropriate:1}  Medications Ordered in ED Medications - No data to display  ED Course/ Medical Decision Making/ A&P                           Medical Decision Making Amount and/or Complexity of Data Reviewed Labs: ordered. ECG/medicine tests: ordered and independent interpretation performed.   56 y.o. M here for questionable seizure.  Picked up from sidewalk outside [employer].  He is awake and alert on arrival to ED.  Breath smells of alcohol, no tongue or dental injury.  He is spontaneously moving all of his extremities without focal deficit.  Labs as above--no leukocytosis.  Does have transaminitis and a 2:1 ratio consistent with alcohol abuse.  Ethanol today is 404.  Suspect he is just clinically intoxicated, low suspicion for actual acute seizure.  No signs of withdrawal.  Will allow to metabolize and monitor.  Final Clinical Impression(s) / ED Diagnoses Final diagnoses:  None    Rx / DC Orders ED Discharge Orders     None

## 2021-10-21 NOTE — ED Triage Notes (Signed)
BIBA from the streets for possible seizure no witness and pt doesn't know how long, Aox4 when EMS arrived   150/92 84 18 99% RA CBG 109 18 LAC

## 2021-10-23 ENCOUNTER — Other Ambulatory Visit: Payer: Self-pay

## 2021-10-23 ENCOUNTER — Emergency Department (HOSPITAL_COMMUNITY): Payer: Medicaid - Out of State

## 2021-10-23 ENCOUNTER — Encounter (HOSPITAL_COMMUNITY): Payer: Self-pay | Admitting: Emergency Medicine

## 2021-10-23 ENCOUNTER — Emergency Department (HOSPITAL_COMMUNITY)
Admission: EM | Admit: 2021-10-23 | Discharge: 2021-10-23 | Disposition: A | Discharge status: Home / Self Care | Payer: Medicaid - Out of State | Attending: Emergency Medicine | Admitting: Emergency Medicine

## 2021-10-23 DIAGNOSIS — F17210 Nicotine dependence, cigarettes, uncomplicated: Secondary | ICD-10-CM | POA: Insufficient documentation

## 2021-10-23 DIAGNOSIS — I10 Essential (primary) hypertension: Secondary | ICD-10-CM | POA: Diagnosis not present

## 2021-10-23 DIAGNOSIS — W19XXXA Unspecified fall, initial encounter: Secondary | ICD-10-CM | POA: Insufficient documentation

## 2021-10-23 DIAGNOSIS — S0990XA Unspecified injury of head, initial encounter: Secondary | ICD-10-CM

## 2021-10-23 DIAGNOSIS — S42022A Displaced fracture of shaft of left clavicle, initial encounter for closed fracture: Secondary | ICD-10-CM | POA: Insufficient documentation

## 2021-10-23 DIAGNOSIS — S0292XA Unspecified fracture of facial bones, initial encounter for closed fracture: Secondary | ICD-10-CM | POA: Insufficient documentation

## 2021-10-23 DIAGNOSIS — S01111A Laceration without foreign body of right eyelid and periocular area, initial encounter: Secondary | ICD-10-CM | POA: Diagnosis not present

## 2021-10-23 DIAGNOSIS — Z79899 Other long term (current) drug therapy: Secondary | ICD-10-CM | POA: Insufficient documentation

## 2021-10-23 DIAGNOSIS — S0181XA Laceration without foreign body of other part of head, initial encounter: Secondary | ICD-10-CM

## 2021-10-23 DIAGNOSIS — S01112A Laceration without foreign body of left eyelid and periocular area, initial encounter: Secondary | ICD-10-CM | POA: Diagnosis not present

## 2021-10-23 DIAGNOSIS — S42002A Fracture of unspecified part of left clavicle, initial encounter for closed fracture: Secondary | ICD-10-CM

## 2021-10-23 LAB — CBC WITH DIFFERENTIAL/PLATELET
Abs Immature Granulocytes: 0.03 10*3/uL (ref 0.00–0.07)
Basophils Absolute: 0 10*3/uL (ref 0.0–0.1)
Basophils Relative: 1 %
Eosinophils Absolute: 0 10*3/uL (ref 0.0–0.5)
Eosinophils Relative: 0 %
HCT: 38 % — ABNORMAL LOW (ref 39.0–52.0)
Hemoglobin: 12.2 g/dL — ABNORMAL LOW (ref 13.0–17.0)
Immature Granulocytes: 0 %
Lymphocytes Relative: 9 %
Lymphs Abs: 0.7 10*3/uL (ref 0.7–4.0)
MCH: 27.4 pg (ref 26.0–34.0)
MCHC: 32.1 g/dL (ref 30.0–36.0)
MCV: 85.4 fL (ref 80.0–100.0)
Monocytes Absolute: 0.9 10*3/uL (ref 0.1–1.0)
Monocytes Relative: 12 %
Neutro Abs: 5.5 10*3/uL (ref 1.7–7.7)
Neutrophils Relative %: 78 %
Platelets: 96 10*3/uL — ABNORMAL LOW (ref 150–400)
RBC: 4.45 MIL/uL (ref 4.22–5.81)
RDW: 18.4 % — ABNORMAL HIGH (ref 11.5–15.5)
WBC: 7.2 10*3/uL (ref 4.0–10.5)
nRBC: 0 % (ref 0.0–0.2)

## 2021-10-23 LAB — PROTIME-INR
INR: 1 (ref 0.8–1.2)
Prothrombin Time: 12.8 seconds (ref 11.4–15.2)

## 2021-10-23 LAB — BASIC METABOLIC PANEL
Anion gap: 10 (ref 5–15)
BUN: 7 mg/dL (ref 6–20)
CO2: 25 mmol/L (ref 22–32)
Calcium: 9 mg/dL (ref 8.9–10.3)
Chloride: 103 mmol/L (ref 98–111)
Creatinine, Ser: 0.71 mg/dL (ref 0.61–1.24)
GFR, Estimated: >60 mL/min (ref >60)
Glucose, Bld: 130 mg/dL — ABNORMAL HIGH (ref 70–99)
Potassium: 4.1 mmol/L (ref 3.5–5.1)
Sodium: 138 mmol/L (ref 135–145)

## 2021-10-23 LAB — CBG MONITORING, ED: Glucose-Capillary: 111 mg/dL — ABNORMAL HIGH (ref 70–99)

## 2021-10-23 MED ORDER — PHENOBARBITAL SODIUM 65 MG/ML IJ SOLN
260.0000 mg | Freq: Once | INTRAMUSCULAR | Status: DC
Start: 2021-10-23 — End: 2021-10-23

## 2021-10-23 MED ORDER — THIAMINE HCL 100 MG PO TABS
100.0000 mg | ORAL_TABLET | Freq: Once | ORAL | Status: AC
  Administered 2021-10-23: 100 mg via ORAL
  Filled 2021-10-23: qty 1

## 2021-10-23 MED ORDER — SODIUM CHLORIDE 0.9 % IV SOLN
260.0000 mg | Freq: Once | INTRAVENOUS | Status: AC
  Administered 2021-10-23: 260 mg via INTRAVENOUS
  Filled 2021-10-23: qty 2

## 2021-10-23 MED ORDER — LIDOCAINE HCL (PF) 1 % IJ SOLN
30.0000 mL | Freq: Once | INTRAMUSCULAR | Status: AC
  Administered 2021-10-23: 30 mL
  Filled 2021-10-23: qty 30

## 2021-10-23 MED ORDER — LACTATED RINGERS IV BOLUS
1000.0000 mL | Freq: Once | INTRAVENOUS | Status: AC
  Administered 2021-10-23: 1000 mL via INTRAVENOUS

## 2021-10-23 MED ORDER — FOLIC ACID 1 MG PO TABS
1.0000 mg | ORAL_TABLET | Freq: Every day | ORAL | Status: DC
  Administered 2021-10-23: 1 mg via ORAL
  Filled 2021-10-23: qty 1

## 2021-10-23 NOTE — Discharge Instructions (Addendum)
We evaluated you today after your fall.  Your imaging was significant for a facial fracture.  Please call the number of the ear nose and throat doctor we have included on your paperwork for follow-up.  There were no sign of injuries inside your head or in your neck.  You also have a fracture of your left clavicle.  Wear your sling until you follow-up with orthopedic surgery.  Please call the number we have included in your paperwork to follow-up with orthopedic surgery.

## 2021-10-23 NOTE — ED Notes (Signed)
Patient transported to CT 
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
## 2021-10-23 NOTE — ED Notes (Signed)
Pt states understanding of dc instructions, importance of follow up. Pt denies questions or concerns upon dc. Pt declined wheelchair assistance upon dc. Pt ambulated out of ed w/ steady gait. No belongings left in room upon dc.  Pt given bus pass.
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
## 2021-10-25 ENCOUNTER — Emergency Department (HOSPITAL_COMMUNITY)
Admission: EM | Admit: 2021-10-25 | Discharge: 2021-10-28 | Disposition: A | Discharge status: Home / Self Care | Payer: Medicaid - Out of State | Attending: Emergency Medicine | Admitting: Emergency Medicine

## 2021-10-25 DIAGNOSIS — Z79899 Other long term (current) drug therapy: Secondary | ICD-10-CM | POA: Diagnosis not present

## 2021-10-25 DIAGNOSIS — R519 Headache, unspecified: Secondary | ICD-10-CM | POA: Insufficient documentation

## 2021-10-25 DIAGNOSIS — F10929 Alcohol use, unspecified with intoxication, unspecified: Secondary | ICD-10-CM

## 2021-10-25 DIAGNOSIS — R45851 Suicidal ideations: Secondary | ICD-10-CM

## 2021-10-25 DIAGNOSIS — M25512 Pain in left shoulder: Secondary | ICD-10-CM | POA: Diagnosis not present

## 2021-10-25 DIAGNOSIS — F1020 Alcohol dependence, uncomplicated: Secondary | ICD-10-CM

## 2021-10-25 DIAGNOSIS — R569 Unspecified convulsions: Secondary | ICD-10-CM | POA: Insufficient documentation

## 2021-10-25 DIAGNOSIS — F332 Major depressive disorder, recurrent severe without psychotic features: Secondary | ICD-10-CM | POA: Diagnosis present

## 2021-10-25 DIAGNOSIS — F1024 Alcohol dependence with alcohol-induced mood disorder: Secondary | ICD-10-CM | POA: Diagnosis present

## 2021-10-25 DIAGNOSIS — I10 Essential (primary) hypertension: Secondary | ICD-10-CM | POA: Insufficient documentation

## 2021-10-25 DIAGNOSIS — Y908 Blood alcohol level of 240 mg/100 ml or more: Secondary | ICD-10-CM | POA: Insufficient documentation

## 2021-10-25 DIAGNOSIS — F1014 Alcohol abuse with alcohol-induced mood disorder: Secondary | ICD-10-CM

## 2021-10-25 DIAGNOSIS — D696 Thrombocytopenia, unspecified: Secondary | ICD-10-CM | POA: Insufficient documentation

## 2021-10-25 LAB — COMPREHENSIVE METABOLIC PANEL
ALT: 62 U/L — ABNORMAL HIGH (ref 0–44)
AST: 194 U/L — ABNORMAL HIGH (ref 15–41)
Albumin: 3.9 g/dL (ref 3.5–5.0)
Alkaline Phosphatase: 166 U/L — ABNORMAL HIGH (ref 38–126)
Anion gap: 10 (ref 5–15)
BUN: <5 mg/dL — ABNORMAL LOW (ref 6–20)
CO2: 24 mmol/L (ref 22–32)
Calcium: 9.1 mg/dL (ref 8.9–10.3)
Chloride: 102 mmol/L (ref 98–111)
Creatinine, Ser: 0.73 mg/dL (ref 0.61–1.24)
GFR, Estimated: >60 mL/min (ref >60)
Glucose, Bld: 102 mg/dL — ABNORMAL HIGH (ref 70–99)
Potassium: 3.3 mmol/L — ABNORMAL LOW (ref 3.5–5.1)
Sodium: 136 mmol/L (ref 135–145)
Total Bilirubin: 0.9 mg/dL (ref 0.3–1.2)
Total Protein: 7.5 g/dL (ref 6.5–8.1)

## 2021-10-25 LAB — RAPID URINE DRUG SCREEN, HOSP PERFORMED
Amphetamines: NOT DETECTED
Barbiturates: POSITIVE — AB
Benzodiazepines: NOT DETECTED
Cocaine: NOT DETECTED
Opiates: NOT DETECTED
Tetrahydrocannabinol: NOT DETECTED

## 2021-10-25 LAB — CBC
HCT: 31.5 % — ABNORMAL LOW (ref 39.0–52.0)
Hemoglobin: 10.3 g/dL — ABNORMAL LOW (ref 13.0–17.0)
MCH: 27.7 pg (ref 26.0–34.0)
MCHC: 32.7 g/dL (ref 30.0–36.0)
MCV: 84.7 fL (ref 80.0–100.0)
Platelets: 91 10*3/uL — ABNORMAL LOW (ref 150–400)
RBC: 3.72 MIL/uL — ABNORMAL LOW (ref 4.22–5.81)
RDW: 18 % — ABNORMAL HIGH (ref 11.5–15.5)
WBC: 5.8 10*3/uL (ref 4.0–10.5)
nRBC: 0 % (ref 0.0–0.2)

## 2021-10-25 LAB — ETHANOL: Alcohol, Ethyl (B): 359 mg/dL (ref <10)

## 2021-10-25 MED ORDER — ONDANSETRON 4 MG PO TBDP
4.0000 mg | ORAL_TABLET | Freq: Once | ORAL | Status: AC
  Administered 2021-10-25: 4 mg via ORAL
  Filled 2021-10-25: qty 1
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
## 2021-10-25 NOTE — ED Notes (Signed)
Pt resting comfortably, RR even, unlabored. NAD noted at this time.

## 2021-10-25 NOTE — ED Notes (Signed)
[place] pt sister would like an update once pt is back and has been evaluated

## 2021-10-26 ENCOUNTER — Encounter (HOSPITAL_COMMUNITY): Payer: Self-pay | Admitting: Emergency Medicine

## 2021-10-26 ENCOUNTER — Emergency Department (HOSPITAL_COMMUNITY): Payer: Medicaid - Out of State

## 2021-10-26 DIAGNOSIS — R45851 Suicidal ideations: Secondary | ICD-10-CM

## 2021-10-26 LAB — POC OCCULT BLOOD, ED: Fecal Occult Bld: NEGATIVE

## 2021-10-26 MED ORDER — LORAZEPAM 2 MG/ML IJ SOLN: 0.0000 mg | Freq: Two times a day (BID) | INTRAMUSCULAR | Status: DC

## 2021-10-26 MED ORDER — KETOROLAC TROMETHAMINE 60 MG/2ML IM SOLN
60.0000 mg | Freq: Once | INTRAMUSCULAR | Status: AC
Start: 2021-10-26 — End: 2021-10-26
  Administered 2021-10-26: 60 mg via INTRAMUSCULAR
  Filled 2021-10-26: qty 2

## 2021-10-26 MED ORDER — CHLORDIAZEPOXIDE HCL 25 MG PO CAPS: ORAL_CAPSULE | ORAL | 0 refills | Status: DC

## 2021-10-26 MED ORDER — THIAMINE HCL 100 MG PO TABS
100.0000 mg | ORAL_TABLET | Freq: Every day | ORAL | Status: DC
  Administered 2021-10-26 – 2021-10-28 (×3): 100 mg via ORAL
  Filled 2021-10-26 (×3): qty 1

## 2021-10-26 MED ORDER — LORAZEPAM 1 MG PO TABS
0.0000 mg | ORAL_TABLET | Freq: Two times a day (BID) | ORAL | Status: DC
  Filled 2021-10-26: qty 1

## 2021-10-26 MED ORDER — LORAZEPAM 2 MG/ML IJ SOLN: 0.0000 mg | Freq: Four times a day (QID) | INTRAMUSCULAR | Status: AC

## 2021-10-26 MED ORDER — ACETAMINOPHEN 500 MG PO TABS: 1000.0000 mg | ORAL_TABLET | Freq: Once | ORAL | Status: DC

## 2021-10-26 MED ORDER — CITALOPRAM HYDROBROMIDE 10 MG PO TABS
10.0000 mg | ORAL_TABLET | Freq: Every day | ORAL | Status: DC
Start: 2021-10-26 — End: 2021-10-28
  Administered 2021-10-26 – 2021-10-28 (×3): 10 mg via ORAL
  Filled 2021-10-26 (×3): qty 1

## 2021-10-26 MED ORDER — LORAZEPAM 1 MG PO TABS
0.0000 mg | ORAL_TABLET | Freq: Four times a day (QID) | ORAL | Status: AC
  Administered 2021-10-26 – 2021-10-27 (×4): 1 mg via ORAL
  Administered 2021-10-27: 2 mg via ORAL
  Administered 2021-10-27 – 2021-10-28 (×2): 1 mg via ORAL
  Filled 2021-10-26: qty 2
  Filled 2021-10-26 (×5): qty 1

## 2021-10-26 MED ORDER — POTASSIUM CHLORIDE CRYS ER 20 MEQ PO TBCR
40.0000 meq | EXTENDED_RELEASE_TABLET | Freq: Once | ORAL | Status: AC
  Administered 2021-10-26: 40 meq via ORAL
  Filled 2021-10-26: qty 2

## 2021-10-26 MED ORDER — THIAMINE HCL 100 MG/ML IJ SOLN
100.0000 mg | Freq: Every day | INTRAMUSCULAR | Status: DC
  Filled 2021-10-26 (×2): qty 1

## 2021-10-26 MED ORDER — CYCLOBENZAPRINE HCL 10 MG PO TABS
10.0000 mg | ORAL_TABLET | Freq: Once | ORAL | Status: AC
  Administered 2021-10-26: 10 mg via ORAL
  Filled 2021-10-26: qty 1

## 2021-10-26 NOTE — ED Notes (Addendum)
Patient got up out of bed to use bathroom without being supervised or assisted. RN and tech educated pt on staying in bed d/t fall risk guidelines.
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
## 2021-10-26 NOTE — Progress Notes (Signed)
Orthopedic Tech Progress Note Patient Details:  Barry Moore 08/21/1965 [id number]  Ortho Devices Type of Ortho Device: Shoulder immobilizer Ortho Device/Splint Location: LUE Ortho Device/Splint Interventions: Ordered, Application, Adjustment   Post Interventions Patient Tolerated: Well Instructions Provided: Care of [place] 10/26/2021, 8:21 AM
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
## 2021-10-26 NOTE — ED Notes (Addendum)
Patient is resting at this time.

## 2021-10-27 NOTE — Progress Notes (Signed)
[facility] ED Progress Note  10/27/2021 12:16 PM Barry Moore  MRN:  [id number]   Subjective:   [facility] seen [facility] [place] for face-to-face reevaluation.  He still endorses being very depressed with suicidal thoughts.  Denies a plan or intent.  Denies homicidal ideations.  Denies any auditory visual hallucinations.  Patient tells me he has little motivation to live especially with his alcoholism and being homeless.  He is hoping for inpatient treatment or residential substance abuse treatment.  I was with the patient when we called [facility] in [place] and they did have one available bed.  However they require a birth certificate and patient does not have that nor does he have the $250 insurance fee.  We then called [facility] together in [place].  However she stated they only had 1 bed open and it is first come first serve and she cannot guarantee that he would have the bed.  Patient stated he did not want to risk going to [place], not receiving a bed, and that being stuck out in [place].  Patient is very tearful stated he does not know what to do.  His sleep has improved since hospital admission.  Appetite is also improving.  Patient continues to be high risk for alcohol withdrawal and seizures.  At this time will recommend for patient to receive inpatient psychiatric treatment.  I do feel that he could benefit from IP especially with his high risk for withdrawal and seizure.  Principal Problem: Suicidal ideations Diagnosis:  Principal Problem:   Suicidal ideations Active Problems:   Alcohol dependence with alcohol-induced mood disorder (HCC)   Major depressive disorder, recurrent severe without psychotic features [facility])   ED Assessment Time Calculation: Start Time: 1200 Stop Time: 1230 Total Time in Minutes (Assessment Completion): 30   Past Psychiatric History:  See previous documentation  [place] Scale:  [place] ED from 10/25/2021 in [place] [facility] ED from 10/23/2021 in [place] DEPT ED from 10/21/2021 in [place] DEPT  C-SSRS RISK CATEGORY High Risk Error: Question 6 not populated Error: Q3, 4, or 5 should not be populated when Q2 is No       Past Medical History:  Past Medical History:  Diagnosis Date   Alcoholic hepatitis    Depression    ETOH abuse    Gastric bleed 08/2018   Hypertension    Pancreatitis    Seizures ([place])    Suicidal behavior     Past Surgical History:  Procedure Laterality Date   BIOPSY  08/05/2018   Procedure: BIOPSY;  Surgeon: Irving Copas., MD;  Location: [facility] ENDOSCOPY;  Service: Gastroenterology;;   BIOPSY  08/07/2018   Procedure: BIOPSY;  Surgeon: Thornton Park, MD;  Location: [place];  Service: Gastroenterology;;   BIOPSY  01/02/2019   Procedure: BIOPSY;  Surgeon: Gatha Mayer, MD;  Location: [facility] ENDOSCOPY;  Service: Endoscopy;;   COLONOSCOPY WITH PROPOFOL N/A 08/07/2018   Procedure: COLONOSCOPY WITH PROPOFOL;  Surgeon: Thornton Park, MD;  Location: [place];  Service: Gastroenterology;  Laterality: N/A;   ESOPHAGOGASTRODUODENOSCOPY N/A 01/02/2019   Procedure: ESOPHAGOGASTRODUODENOSCOPY (EGD);  Surgeon: Gatha Mayer, MD;  Location[facility];  Service: Endoscopy;  Laterality: N/A;   ESOPHAGOGASTRODUODENOSCOPY N/A 07/03/2019   Procedure: ESOPHAGOGASTRODUODENOSCOPY (EGD);  Surgeon: Wonda Horner, MD;  Location: [facility];  Service: Endoscopy;  Laterality: N/A;   ESOPHAGOGASTRODUODENOSCOPY N/A 06/26/2020   Procedure: ESOPHAGOGASTRODUODENOSCOPY (EGD);  Surgeon: Paulita Fujita,  Gwyndolyn Saxon, MD;  Location: [facility];  Service: Endoscopy;  Laterality: N/A;   ESOPHAGOGASTRODUODENOSCOPY (EGD) WITH PROPOFOL N/A 11/02/2016   Procedure: ESOPHAGOGASTRODUODENOSCOPY (EGD) WITH PROPOFOL;  Surgeon: Carol Ada, MD;  Location: [facility];  Service: Endoscopy;  Laterality: N/A;    ESOPHAGOGASTRODUODENOSCOPY (EGD) WITH PROPOFOL N/A 08/05/2018   Procedure: ESOPHAGOGASTRODUODENOSCOPY (EGD) WITH PROPOFOL;  Surgeon: Rush Landmark Telford Nab., MD;  Location: [place];  Service: Gastroenterology;  Laterality: N/A;   ESOPHAGOGASTRODUODENOSCOPY (EGD) WITH PROPOFOL N/A 07/14/2019   Procedure: ESOPHAGOGASTRODUODENOSCOPY (EGD) WITH PROPOFOL;  Surgeon: Otis Brace, MD;  [facility];  Service: Gastroenterology;  Laterality: N/A;   HERNIA REPAIR     LEG SURGERY     POLYPECTOMY  08/07/2018   Procedure: POLYPECTOMY;  Surgeon: Thornton Park, MD;  Location: [facility] ENDOSCOPY;  Service: Gastroenterology;;   Family History:  Family History  Problem Relation Age of Onset   Diabetes Mother    Alcoholism Mother    Emphysema Father    Lung cancer Father    Alcoholism Father    Social History:  Social History   Substance and Sexual Activity  Alcohol Use Yes   Comment: 5+ BEERS DAILY, Patient does not know how much he drank tonight     Social History   Substance and Sexual Activity  Drug Use Not Currently   Frequency: 3.0 times per week   Types: Cocaine    Social History   Socioeconomic History   Marital status: Divorced    Spouse name: Not on file   Number of children: Not on file   Years of education: Not on file   Highest education level: Not on file  Occupational History   Not on file  Tobacco Use   Smoking status: Every Day    Packs/day: 1.00    Types: Cigarettes   Smokeless tobacco: Never  Vaping Use   Vaping Use: Never used  Substance and Sexual Activity   Alcohol use: Yes    Comment: 5+ BEERS DAILY, Patient does not know how much he drank tonight   Drug use: Not Currently    Frequency: 3.0 times per week    Types: Cocaine   Sexual activity: Not Currently  Other Topics Concern   Not on file  Social History Narrative   Not on file   Social Determinants of Health   Financial Resource Strain: Not on file  Food Insecurity: Not on file   Transportation Needs: Not on file  Physical Activity: Not on file  Stress: Not on file  Social Connections: Not on file    Sleep: Fair  Appetite:  Fair  Current Medications: Current Facility-Administered Medications  Medication Dose Route Frequency Provider Last Rate Last Admin   citalopram (CELEXA) tablet 10 mg  10 mg Oral Daily Vesta Mixer, NP   10 mg at 10/27/21 1047   LORazepam (ATIVAN) injection 0-4 mg  0-4 mg Intravenous Q6H Belaya, Maria A, PA-C       Or   LORazepam (ATIVAN) tablet 0-4 mg  0-4 mg Oral Q6H Belaya, Maria A, PA-C   1 mg at 10/27/21 1046   [START ON 10/28/2021] LORazepam (ATIVAN) injection 0-4 mg  0-4 mg Intravenous Q12H Belaya, Maria A, PA-C       Or   [START ON 10/28/2021] LORazepam (ATIVAN) tablet 0-4 mg  0-4 mg Oral Q12H Belaya, Maria A, PA-C       thiamine (VITAMIN B1) tablet 100 mg  100 mg Oral Daily Belaya, Maria A, PA-C   100 mg at  10/27/21 1047   Or   thiamine (VITAMIN B1) injection 100 mg  100 mg Intravenous Daily Garald Balding, PA-C       Current Outpatient Medications  Medication Sig Dispense Refill   chlordiazePOXIDE (LIBRIUM) 25 MG capsule '50mg'$  PO TID x 1D, then 25-'50mg'$  PO BID X 1D, then 25-'50mg'$  PO QD X 1D 10 capsule 0   amLODipine (NORVASC) 5 MG tablet Take 1 tablet (5 mg total) by mouth daily. (Patient not taking: Reported on 08/26/2021) 30 tablet 0   folic acid (FOLVITE) 1 MG tablet Take 1 tablet (1 mg total) by mouth daily. (Patient not taking: Reported on 08/26/2021) 30 tablet 0   Multiple Vitamin (MULTIVITAMIN WITH MINERALS) TABS tablet Take 1 tablet by mouth daily. (Patient not taking: Reported on 08/26/2021)     thiamine 100 MG tablet Take 1 tablet (100 mg total) by mouth daily. (Patient not taking: Reported on 08/26/2021) 30 tablet 0    Lab Results:  Results for orders placed or performed during the hospital encounter of 10/25/21 (from the past 48 hour(s))  Comprehensive metabolic panel     Status: Abnormal   Collection Time: 10/25/21  9:24  PM  Result Value Ref Range   Sodium 136 135 - 145 mmol/L   Potassium 3.3 (L) 3.5 - 5.1 mmol/L   Chloride 102 98 - 111 mmol/L   CO2 24 22 - 32 mmol/L   Glucose, Bld 102 (H) 70 - 99 mg/dL    Comment: Glucose reference range applies only to samples taken after fasting for at least 8 hours.   BUN <5 (L) 6 - 20 mg/dL   Creatinine, Ser 0.73 0.61 - 1.24 mg/dL   Calcium 9.1 8.9 - 10.3 mg/dL   Total Protein 7.5 6.5 - 8.1 g/dL   Albumin 3.9 3.5 - 5.0 g/dL   AST 194 (H) 15 - 41 U/L   ALT 62 (H) 0 - 44 U/L   Alkaline Phosphatase 166 (H) 38 - 126 U/L   Total Bilirubin 0.9 0.3 - 1.2 mg/dL   GFR, Estimated >60 >60 mL/min    Comment: (NOTE) Calculated using the CKD-EPI Creatinine Equation (2021)    Anion gap 10 5 - 15    Comment: Performed at [facility], [place] [address], [place], [place] [address]  Ethanol     Status: Abnormal   Collection Time: 10/25/21  9:24 PM  Result Value Ref Range   Alcohol, Ethyl (B) 359 (HH) <10 mg/dL    Comment: CRITICAL RESULT CALLED TO, READ BACK BY AND VERIFIED WITH Elige Radon, RN, 2251, 10/25/21, EADEDOKUN (NOTE) Lowest detectable limit for serum alcohol is 10 mg/dL.  For medical purposes only. Performed at [facility], [place] [address] [place], [place] [address]   cbc     Status: Abnormal   Collection Time: 10/25/21  9:24 PM  Result Value Ref Range   WBC 5.8 4.0 - 10.5 K/uL   RBC 3.72 (L) 4.22 - 5.81 MIL/uL   Hemoglobin 10.3 (L) 13.0 - 17.0 g/dL   HCT 31.5 (L) 39.0 - 52.0 %   MCV 84.7 80.0 - 100.0 fL   MCH 27.7 26.0 - 34.0 pg   MCHC 32.7 30.0 - 36.0 g/dL   RDW 18.0 (H) 11.5 - 15.5 %   Platelets 91 (L) 150 - 400 K/uL    Comment: REPEATED TO VERIFY   nRBC 0.0 0.0 - 0.2 %    Comment: Performed at [facility]  [address], [place], [place] [address]  Rapid urine drug screen (hospital performed)     Status: Abnormal   Collection Time: 10/25/21  9:26 PM  Result Value Ref Range   Opiates NONE DETECTED NONE DETECTED   Cocaine NONE  DETECTED NONE DETECTED   Benzodiazepines NONE DETECTED NONE DETECTED   Amphetamines NONE DETECTED NONE DETECTED   Tetrahydrocannabinol NONE DETECTED NONE DETECTED   Barbiturates POSITIVE (A) NONE DETECTED    Comment: (NOTE) DRUG SCREEN FOR MEDICAL PURPOSES ONLY.  IF CONFIRMATION IS NEEDED FOR ANY PURPOSE, NOTIFY LAB WITHIN 5 DAYS.  LOWEST DETECTABLE LIMITS FOR URINE DRUG SCREEN Drug Class                     Cutoff (ng/mL) Amphetamine and metabolites    1000 Barbiturate and metabolites    200 Benzodiazepine                 759 Tricyclics and metabolites     300 Opiates and metabolites        300 Cocaine and metabolites        300 THC                            50 Performed at [facility], [place] [address] [place], [place] [address]   POC occult blood, ED     Status: None   Collection Time: 10/26/21  6:50 AM  Result Value Ref Range   Fecal Occult Bld NEGATIVE NEGATIVE    Blood Alcohol level:  Lab Results  Component Value Date   ETH 359 (HH) 10/25/2021   ETH 404 (HH) 10/21/2021    Physical Findings:  CIWA:  CIWA-Ar Total: 7 COWS:     Psychiatric Specialty Exam:  Presentation  General Appearance: Disheveled  Eye Contact:Fair  Speech:Clear and Coherent  Speech Volume:Normal  Handedness:Right   Mood and Affect  Mood:Depressed; Hopeless  Affect:Congruent   Thought Process  Thought Processes:Coherent  Descriptions of Associations:Intact  Orientation:Full (Time, Place and Person)  Thought Content:Logical  History of Schizophrenia/Schizoaffective disorder:No  Duration of Psychotic Symptoms:N/A  Hallucinations:Hallucinations: None  Ideas of Reference:None  Suicidal Thoughts:Suicidal Thoughts: Yes, Active SI Active Intent and/or Plan: Without Intent; Without Plan  Homicidal Thoughts:Homicidal Thoughts: No   Sensorium  Memory:Immediate [place]  Insight:Fair   Executive Functions  Concentration:Good  Attention  Span:Good  [place] of Knowledge:Good  Language:Good   Psychomotor Activity  Psychomotor Activity:Psychomotor Activity: Tremor   Assets  Assets:Communication Skills; Desire for Improvement; Physical Health; Resilience   Sleep  Sleep:Sleep: Fair    Physical Exam: Physical Exam Neurological:     Mental Status: He is alert and oriented to person, place, and time.    Review of Systems  Neurological:  Positive for tremors.  Psychiatric/Behavioral:  Positive for depression and suicidal ideas.   All other systems reviewed and are negative.  Blood pressure (!) 146/74, pulse 95, temperature 98.6 F (37 C), temperature source Oral, resp. rate 16, SpO2 97 %. There is no height or weight on file to calculate BMI.   Medical Decision Making: Patient case reviewed and discussed with Dr. Dwyane Dee.  Will recommend inpatient psychiatric treatment.  Patient started on Celexa yesterday, denies any side effects.  Will add trazodone to help assist with sleep.  There is no bed availability at [facility] at this time, CSW notified and will fax patient out.   Vesta Mixer, NP 10/27/2021, 12:16 PM

## 2021-10-27 NOTE — ED Notes (Signed)
Barry Moore request something for his nerves, states he's starting to feel more anxious and would like to "ward it off" before it starts.

## 2021-10-27 NOTE — ED Provider Notes (Addendum)
Emergency Medicine Observation Re-evaluation Note  Barry Moore is a 56 y.o. male, seen on rounds today.  Pt initially presented to the ED for complaints of Alcohol Intoxication Currently, the patient is alert, cooperative. Pain from recent clavicle fx currently controlled.  Physical Exam  BP (!) 146/74 (BP Location: Right Arm)   Pulse 95   Temp 98.6 F (37 C) (Oral)   Resp 16   SpO2 97%  Physical Exam General: alert, content.  Cardiac: regular rate.  Lungs: breathing comfortably. Psych: pt conversant, expresses thoughts clearly. Pt acknowledges stress, and intermittent issues with anxiety and feelings of depression related to chronic homelessness and etoh use disorder.  Pt without thoughts of or plan to harm self currently. No thoughts of harm to others. He does express desire to eventually give up etoh.  Pt is not responding to internal stimuli - no acute psychosis noted.   ED Course / MDM    I have reviewed the labs performed to date as well as medications administered while in observation.  Recent changes in the last 24 hours include ED obs, metabolism of etoh, reassessment.   Plan   Barry Moore is not under involuntary commitment.  Chart reviewed - pt with long history acute/chronic etoh use disorder, and related mood disorder.  Within past several years, and including past few months, multiple prior evals for same, including inpatient stays and encouragement of cessation and pursuing tx programs - pt is encouraged to pursue sobriety and tx programs.  Will give rx librium.  Also rec close outpatient pcp and bh f/u.  Return precautions provided.        Lajean Saver, MD 10/27/21 [phone]

## 2021-10-27 NOTE — Progress Notes (Signed)
Inpatient Behavioral Health Placement  Pt meets inpatient criteria per Vesta Mixer, NP. There are no available beds at [facility] per [place], RN. Referral was sent to the following facilities;    Destination Service Provider Address Phone Fax  [facility]  [address] [place] [address] [phone]  [place]  [address], [place] [address] [phone]  [facility]  [place], [place] [address] [phone] [facility]  [address] [place] [address] [phone]  [facility] Center-Adult  [place], [place] [address] [phone]  [facility] Center-Geriatric  [place], [place] [address] [phone] [facility]  [address] [place] [address] [phone] [facility]  [address], [place] [address] [phone]  [facility]  [address] [place] [address] [place], [place] [address] [phone]  [facility]  [address] [place] [address] [phone]  [facility]  [address] [place] [address] [phone] [facility]  [place], [place] [address] [phone]  [facility]  [address] [place] [address] [phone]   Situation ongoing,  CSW will follow up.   Benjaman Kindler, MSW, LCSWA 10/27/2021  @ 11:29 AM

## 2021-10-28 MED ORDER — CITALOPRAM HYDROBROMIDE 10 MG PO TABS: 10.0000 mg | ORAL_TABLET | Freq: Every day | ORAL | 0 refills | Status: DC

## 2021-10-28 NOTE — ED Provider Notes (Addendum)
Emergency Medicine Observation Re-evaluation Note  Barry Moore is a 56 y.o. male, seen on rounds today.  Pt initially presented to the ED for complaints of Alcohol Intoxication Currently, the patient is asleep in bed resting comfortably.  Physical Exam  BP (!) 141/96 (BP Location: Right Arm)   Pulse 82   Temp 97.8 F (36.6 C) (Oral)   Resp 16   SpO2 99%  Physical Exam General: Asleep, no acute distress Cardiac: Regular rate Lungs: No increased work of breathing Psych: Calm, asleep in bed  ED Course / MDM  EKG:EKG Interpretation  Date/Time:  Sunday October 25 2021 21:44:49 EDT Ventricular Rate:  90 PR Interval:  158 QRS Duration: 94 QT Interval:  346 QTC Calculation: 423 R Axis:   80 Text Interpretation: Normal sinus rhythm Normal ECG When compared with ECG of 21-Oct-2021 22:33, PREVIOUS ECG IS PRESENT No significant change since last tracing Confirmed by Calvert Cantor [phone]) on 10/26/2021 8:45:26 AM  I have reviewed the labs performed to date as well as medications administered while in observation.  Recent changes in the last 24 hours include patient was initially mildly hypokalemic on arrival and had his potassium repleted.  He has been accepted to inpatient treatment and is pending acceptance to inpatient treatment.  Plan  Current plan is for inpatient treatment once placement is found  Barry Moore is not under involuntary commitment.     Ottie Glazier, DO 10/28/21 [place], [place], DO 10/28/21 [phone]

## 2021-10-28 NOTE — ED Notes (Signed)
AVS with prescriptions provided to and discussed with patient. Pt verbalizes understanding of discharge instructions and denies any questions or concerns at this time. Pt ambulated out of department independently with steady gait. Pt provided with taxi voucher to use upon discharge.

## 2021-10-28 NOTE — Discharge Summary (Signed)
Barry [facility]  10/28/2021 10:35 AM Barry [facility]  MRN:  [id number]  Principal Problem: Suicidal ideations [facility] Diagnoses: Principal Problem:   Suicidal ideations Active Problems:   Alcohol dependence with alcohol-induced mood disorder (HCC)   Major depressive disorder, recurrent severe without psychotic features ([place])  Clinical Impression:  Final diagnoses:  Alcoholic intoxication with complication ([place])  Alcohol abuse with alcohol-induced mood disorder (HCC)  Chronic alcoholism ([place])   Subjective:  [facility] seen at [name] for face-to-face reevaluation.  Patient is pleasant and appears to have a brighter affect.  Patient tells me he slept very well last night and feels like his appetite is good.  He denies any suicidal or homicidal ideations today.  He denies any auditory or visual hallucinations.  He tells me he feels much better and feels ready to [facility] today. Denies any current withdrawal symptoms. He would like to continue Celexa, will send a 7 day prescription for him and talked about [facility] OP walk-in hours to establish OP care for med management. Patient stated he would like to go to [facility] in downtown [place] which is a dual diagnosis drug and alcohol addiction treatment center.  We also talked about community resources like the [facility].  Patient stated he would like to go to the [facility] first, patient requesting a cab to take him there. Discussed other resources if he finds himself in crisis again such as [facility].   Patient is able to engage in coherent and logical conversation.  Patient has brighter affect and improved mood.  Denies any current alcohol withdrawal symptoms.  Thought content intact.  Speech is normal in rate and tone.  Patient requesting to [facility], stated he feels ready and is able to contract for safety.  Patient tells me he is motivated to stay sober.  ED Assessment Time Calculation: Start Time: 0900 Stop Time: 0930 Total  Time in Minutes (Assessment Completion): 30   Past Psychiatric History:  Reported history of depression, anxiety, and PTSD.  Significant history for alcohol use disorder with frequent presentations for suicidal or homicidal ideations.  Chronic homelessness for around 20 years.  Past Medical History:  Past Medical History:  Diagnosis Date   Alcoholic hepatitis    Depression    ETOH abuse    Gastric bleed 08/2018   Hypertension    Pancreatitis    Seizures ([place])    Suicidal behavior     Past Surgical History:  Procedure Laterality Date   BIOPSY  08/05/2018   Procedure: BIOPSY;  Surgeon: Irving Copas., MD;  Location: [facility] ENDOSCOPY;  Service: Gastroenterology;;   BIOPSY  08/07/2018   Procedure: BIOPSY;  Surgeon: Thornton Park, MD;  Location:  [facility] ENDOSCOPY;  Service: Gastroenterology;;   BIOPSY  01/02/2019   Procedure: BIOPSY;  Surgeon: Gatha Mayer, MD;  Location: [facility] ENDOSCOPY;  Service: Endoscopy;;   COLONOSCOPY WITH PROPOFOL N/A 08/07/2018   Procedure: COLONOSCOPY WITH PROPOFOL;  Surgeon: Thornton Park, MD;  Location: [place];  Service: Gastroenterology;  Laterality: N/A;   ESOPHAGOGASTRODUODENOSCOPY N/A 01/02/2019   Procedure: ESOPHAGOGASTRODUODENOSCOPY (EGD);  Surgeon: Gatha Mayer, MD;  Location: [facility] ENDOSCOPY;  Service: Endoscopy;  Laterality: N/A;   ESOPHAGOGASTRODUODENOSCOPY N/A 07/03/2019   Procedure: ESOPHAGOGASTRODUODENOSCOPY (EGD);  Surgeon: Wonda Horner, MD;  Location: [facility] ENDOSCOPY;  Service: Endoscopy;  Laterality: N/A;   ESOPHAGOGASTRODUODENOSCOPY N/A 06/26/2020   Procedure: ESOPHAGOGASTRODUODENOSCOPY (EGD);  Surgeon: Arta Silence, MD;  Location: [facility] ENDOSCOPY;  Service: Endoscopy;  Laterality: N/A;   ESOPHAGOGASTRODUODENOSCOPY (EGD) WITH PROPOFOL  N/A 11/02/2016   Procedure: ESOPHAGOGASTRODUODENOSCOPY (EGD) WITH PROPOFOL;  Surgeon: Carol Ada, MD;  Location: [facility] ENDOSCOPY;  Service: Endoscopy;  Laterality: N/A;   ESOPHAGOGASTRODUODENOSCOPY (EGD)  WITH PROPOFOL N/A 08/05/2018   Procedure: ESOPHAGOGASTRODUODENOSCOPY (EGD) WITH PROPOFOL;  Surgeon: Rush Landmark Telford Nab., MD;  Location: [place];  Service: Gastroenterology;  Laterality: N/A;   ESOPHAGOGASTRODUODENOSCOPY (EGD) WITH PROPOFOL N/A 07/14/2019   Procedure: ESOPHAGOGASTRODUODENOSCOPY (EGD) WITH PROPOFOL;  Surgeon: Otis Brace, MD;  Location: [facility] ENDOSCOPY;  Service: Gastroenterology;  Laterality: N/A;   HERNIA REPAIR     LEG SURGERY     POLYPECTOMY  08/07/2018   Procedure: POLYPECTOMY;  Surgeon: Thornton Park, MD;  Location: [facility] ENDOSCOPY;  Service: Gastroenterology;;   Family History:  Family History  Problem Relation Age of Onset   Diabetes Mother    Alcoholism Mother    Emphysema Father    Lung cancer Father    Alcoholism Father    Social History:  Social History   Substance and Sexual Activity  Alcohol Use Yes   Comment: 5+ BEERS DAILY, Patient does not know how much he drank tonight     Social History   Substance and Sexual Activity  Drug Use Not Currently   Frequency: 3.0 times per week   Types: Cocaine    Social History   Socioeconomic History   Marital status: Divorced    Spouse name: Not on file   Number of children: Not on file   Years of education: Not on file   Highest education level: Not on file  Occupational History   Not on file  Tobacco Use   Smoking status: Every Day    Packs/day: 1.00    Types: Cigarettes   Smokeless tobacco: Never  Vaping Use   Vaping Use: Never used  Substance and Sexual Activity   Alcohol use: Yes    Comment: 5+ BEERS DAILY, Patient does not know how much he drank tonight   Drug use: Not Currently    Frequency: 3.0 times per week    Types: Cocaine   Sexual activity: Not Currently  Other Topics Concern   Not on file  Social History Narrative   Not on file   Social Determinants of Health   Financial Resource Strain: Not on file  Food Insecurity: Not on file  Transportation Needs: Not on file   Physical Activity: Not on file  Stress: Not on file  Social Connections: Not on file    Tobacco Cessation:  A prescription for an FDA-approved tobacco cessation medication was offered at [facility] and the patient refused  Current Medications: Current Facility-Administered Medications  Medication Dose Route Frequency Provider Last Rate Last Admin   citalopram (CELEXA) tablet 10 mg  10 mg Oral Daily Vesta Mixer, NP   10 mg at 10/28/21 0756   LORazepam (ATIVAN) injection 0-4 mg  0-4 mg Intravenous Q12H Belaya, Maria A, PA-C       Or   LORazepam (ATIVAN) tablet 0-4 mg  0-4 mg Oral Q12H Belaya, Maria A, PA-C       thiamine (VITAMIN B1) tablet 100 mg  100 mg Oral Daily Sharyn Lull A, PA-C   100 mg at 10/28/21 2706   Or   thiamine (VITAMIN B1) injection 100 mg  100 mg Intravenous Daily Garald Balding, PA-C       Current Outpatient Medications  Medication Sig Dispense Refill   chlordiazePOXIDE (LIBRIUM) 25 MG capsule '50mg'$  PO TID x 1D, then 25-'50mg'$  PO BID X 1D, then 25-'50mg'$  PO  QD X 1D 10 capsule 0   amLODipine (NORVASC) 5 MG tablet Take 1 tablet (5 mg total) by mouth daily. (Patient not taking: Reported on 08/26/2021) 30 tablet 0   folic acid (FOLVITE) 1 MG tablet Take 1 tablet (1 mg total) by mouth daily. (Patient not taking: Reported on 08/26/2021) 30 tablet 0   Multiple Vitamin (MULTIVITAMIN WITH MINERALS) TABS tablet Take 1 tablet by mouth daily. (Patient not taking: Reported on 08/26/2021)     thiamine 100 MG tablet Take 1 tablet (100 mg total) by mouth daily. (Patient not taking: Reported on 08/26/2021) 30 tablet 0   PTA Medications: (Not in a hospital admission)   [place] Scale:  [place] ED from 10/25/2021 in [place] ED from 10/23/2021 in [place] DEPT ED from 10/21/2021 in [place] DEPT  C-SSRS RISK CATEGORY High Risk Error: Question 6 not populated Error: Q3, 4, or 5 should not  be populated when Q2 is No      Psychiatric Specialty Exam: Presentation  General Appearance: Appropriate for Environment  Eye Contact:Good  Speech:Clear and Coherent  Speech Volume:Normal  Handedness:Right   Mood and Affect  Mood:Euthymic  Affect:Congruent   Thought Process  Thought Processes:Coherent  Descriptions of Associations:Intact  Orientation:Full (Time, Place and Person)  Thought Content:Logical  History of Schizophrenia/Schizoaffective disorder:No  Duration of Psychotic Symptoms:N/A  Hallucinations:Hallucinations: None  Ideas of Reference:None  Suicidal Thoughts:Suicidal Thoughts: No SI Active Intent and/or Plan: Without Intent; Without Plan  Homicidal Thoughts:Homicidal Thoughts: No   Sensorium  Memory:Immediate [place]  Insight:Fair   Executive Functions  Concentration:Good  Attention Span:Good  [place] of Knowledge:Good  Language:Good   Psychomotor Activity  Psychomotor Activity:Psychomotor Activity: Normal   Assets  Assets:Communication Skills; Desire for Improvement; Physical Health; Resilience   Sleep  Sleep:Sleep: Good    Physical Exam: Physical Exam Neurological:     Mental Status: He is alert and oriented to person, place, and time.  Psychiatric:        Behavior: Behavior is cooperative.        Thought Content: Thought content normal.    Review of Systems  Psychiatric/Behavioral:  Positive for substance abuse.   All other systems reviewed and are negative.  Blood pressure (!) 141/96, pulse 82, temperature 97.8 F (36.6 C), temperature source Oral, resp. rate 16, SpO2 99 %. There is no height or weight on file to calculate BMI.   Demographic Factors:  Male, Caucasian, Low socioeconomic status, and Unemployed  Loss Factors: Financial problems/change in socioeconomic status  Historical Factors: Impulsivity and Substance abuse  Risk Reduction Factors:   Religious beliefs  about death and Positive coping skills or problem solving skills  Continued Clinical Symptoms:  Alcohol/Substance Abuse/Dependencies  Cognitive Features That Contribute To Risk:  None    Suicide Risk:  Mild:  Suicidal ideation of limited frequency, intensity, duration, and specificity.  There are no identifiable plans, no associated intent, mild dysphoria and related symptoms, good self-control (both objective and subjective assessment), few other risk factors, and identifiable protective factors, including available and accessible social support.   Follow-up Information     Schedule an appointment as soon as possible for a visit  with Dillingham, Loel Lofty, DO.   Specialty: Plastic Surgery Why: For your facial fracture Contact information: [place] [address] [phone]         Schedule an appointment as soon as possible for a visit  with [facility].   Why: For your left clavicular fracut Contact information: [place] [phone]                Plan Of Care/Follow-up recommendations:  Other:  D/C to [facility] center. They can assist you with exploring substance abuse tx options, shelters, and job assistance. Resources in [facility] for community resources on shelters, substance abuse, and OP follow up.   Medical Decision Making: Patient case reviewed and discussed with Dr. Dwyane Dee.  Patient denies suicidal or homicidal ideations, states he is ready for [facility] and is able to contract for safety.  Patient has good follow-up intentions such as seeking substance abuse treatment and wanting to go to the [facility].  Will send in 7-day supply of Celexa as requested by patient.   Disposition: Psych cleared  Vesta Mixer, NP 10/28/2021, 10:35 AM

## 2021-11-02 ENCOUNTER — Other Ambulatory Visit: Payer: Self-pay

## 2021-11-02 ENCOUNTER — Emergency Department (HOSPITAL_COMMUNITY)
Admission: EM | Admit: 2021-11-02 | Discharge: 2021-11-04 | Disposition: A | Discharge status: Home / Self Care | Payer: Medicaid - Out of State | Attending: Student | Admitting: Student

## 2021-11-02 ENCOUNTER — Encounter (HOSPITAL_COMMUNITY): Payer: Self-pay

## 2021-11-02 DIAGNOSIS — F1010 Alcohol abuse, uncomplicated: Secondary | ICD-10-CM

## 2021-11-02 DIAGNOSIS — F10239 Alcohol dependence with withdrawal, unspecified: Secondary | ICD-10-CM | POA: Diagnosis not present

## 2021-11-02 DIAGNOSIS — F17210 Nicotine dependence, cigarettes, uncomplicated: Secondary | ICD-10-CM | POA: Insufficient documentation

## 2021-11-02 DIAGNOSIS — Y908 Blood alcohol level of 240 mg/100 ml or more: Secondary | ICD-10-CM | POA: Diagnosis not present

## 2021-11-02 DIAGNOSIS — F332 Major depressive disorder, recurrent severe without psychotic features: Secondary | ICD-10-CM | POA: Diagnosis present

## 2021-11-02 DIAGNOSIS — I10 Essential (primary) hypertension: Secondary | ICD-10-CM | POA: Insufficient documentation

## 2021-11-02 DIAGNOSIS — F10930 Alcohol use, unspecified with withdrawal, uncomplicated: Secondary | ICD-10-CM | POA: Diagnosis not present

## 2021-11-02 DIAGNOSIS — R45851 Suicidal ideations: Secondary | ICD-10-CM | POA: Diagnosis not present

## 2021-11-02 DIAGNOSIS — Z79899 Other long term (current) drug therapy: Secondary | ICD-10-CM | POA: Diagnosis not present

## 2021-11-02 LAB — COMPREHENSIVE METABOLIC PANEL
ALT: 64 U/L — ABNORMAL HIGH (ref 0–44)
AST: 110 U/L — ABNORMAL HIGH (ref 15–41)
Albumin: 3.8 g/dL (ref 3.5–5.0)
Alkaline Phosphatase: 190 U/L — ABNORMAL HIGH (ref 38–126)
Anion gap: 8 (ref 5–15)
BUN: 8 mg/dL (ref 6–20)
CO2: 23 mmol/L (ref 22–32)
Calcium: 8.5 mg/dL — ABNORMAL LOW (ref 8.9–10.3)
Chloride: 108 mmol/L (ref 98–111)
Creatinine, Ser: 0.66 mg/dL (ref 0.61–1.24)
GFR, Estimated: >60 mL/min (ref >60)
Glucose, Bld: 97 mg/dL (ref 70–99)
Potassium: 4.1 mmol/L (ref 3.5–5.1)
Sodium: 139 mmol/L (ref 135–145)
Total Bilirubin: 0.5 mg/dL (ref 0.3–1.2)
Total Protein: 7.7 g/dL (ref 6.5–8.1)

## 2021-11-02 LAB — CBC
HCT: 31.4 % — ABNORMAL LOW (ref 39.0–52.0)
Hemoglobin: 10 g/dL — ABNORMAL LOW (ref 13.0–17.0)
MCH: 27.7 pg (ref 26.0–34.0)
MCHC: 31.8 g/dL (ref 30.0–36.0)
MCV: 87 fL (ref 80.0–100.0)
Platelets: 218 10*3/uL (ref 150–400)
RBC: 3.61 MIL/uL — ABNORMAL LOW (ref 4.22–5.81)
RDW: 18.4 % — ABNORMAL HIGH (ref 11.5–15.5)
WBC: 6.3 10*3/uL (ref 4.0–10.5)
nRBC: 0 % (ref 0.0–0.2)

## 2021-11-02 LAB — RAPID URINE DRUG SCREEN, HOSP PERFORMED
Amphetamines: NOT DETECTED
Barbiturates: POSITIVE — AB
Benzodiazepines: NOT DETECTED
Cocaine: NOT DETECTED
Opiates: NOT DETECTED
Tetrahydrocannabinol: NOT DETECTED

## 2021-11-02 LAB — ACETAMINOPHEN LEVEL: Acetaminophen (Tylenol), Serum: <10 ug/mL — ABNORMAL LOW (ref 10–30)

## 2021-11-02 LAB — ETHANOL: Alcohol, Ethyl (B): 303 mg/dL (ref <10)

## 2021-11-02 LAB — SALICYLATE LEVEL: Salicylate Lvl: <7 mg/dL — ABNORMAL LOW (ref 7.0–30.0)

## 2021-11-02 NOTE — ED Triage Notes (Addendum)
Pt BIB EMS with reports of SI and ETOH. Pt's last drink was 6 pm. 500 ml ns given  18 g left forearm.

## 2021-11-03 DIAGNOSIS — F10930 Alcohol use, unspecified with withdrawal, uncomplicated: Secondary | ICD-10-CM | POA: Diagnosis not present

## 2021-11-03 MED ORDER — LORAZEPAM 1 MG PO TABS
0.0000 mg | ORAL_TABLET | Freq: Four times a day (QID) | ORAL | Status: DC
  Administered 2021-11-03 – 2021-11-04 (×4): 1 mg via ORAL
  Filled 2021-11-03 (×4): qty 1

## 2021-11-03 MED ORDER — TRAZODONE HCL 100 MG PO TABS: 50.0000 mg | ORAL_TABLET | Freq: Every evening | ORAL | Status: DC | PRN

## 2021-11-03 MED ORDER — LORAZEPAM 2 MG/ML IJ SOLN: 0.0000 mg | Freq: Four times a day (QID) | INTRAMUSCULAR | Status: DC

## 2021-11-03 MED ORDER — LORAZEPAM 1 MG PO TABS: 1.0000 mg | ORAL_TABLET | Freq: Three times a day (TID) | ORAL | Status: DC | PRN

## 2021-11-03 MED ORDER — THIAMINE HCL 100 MG PO TABS
100.0000 mg | ORAL_TABLET | Freq: Every day | ORAL | Status: DC
  Administered 2021-11-03: 100 mg via ORAL
  Filled 2021-11-03: qty 1

## 2021-11-03 MED ORDER — THIAMINE HCL 100 MG/ML IJ SOLN: 100.0000 mg | Freq: Every day | INTRAMUSCULAR | Status: DC

## 2021-11-03 MED ORDER — LORAZEPAM 2 MG/ML IJ SOLN: 0.0000 mg | Freq: Two times a day (BID) | INTRAMUSCULAR | Status: DC

## 2021-11-03 MED ORDER — CITALOPRAM HYDROBROMIDE 10 MG PO TABS
10.0000 mg | ORAL_TABLET | Freq: Every day | ORAL | Status: DC
  Administered 2021-11-03: 10 mg via ORAL
  Filled 2021-11-03: qty 1

## 2021-11-03 MED ORDER — LORAZEPAM 1 MG PO TABS: 0.0000 mg | ORAL_TABLET | Freq: Two times a day (BID) | ORAL | Status: DC

## 2021-11-03 NOTE — ED Notes (Signed)
Pt has one belonging bag, labeled, and placed in cabinet at nurses station. Pt wanded by security.

## 2021-11-03 NOTE — ED Notes (Addendum)
Pt ambulatory to restroom w/out assistance.  

## 2021-11-03 NOTE — ED Provider Notes (Signed)
Behavioral health and psychiatry team is recommended another overnight observation of the patient, given history of alcohol withdrawal and seizures, and reassessment in the morning.  The patient remains voluntary, is not under IVC, and behavioral team feels that if the patient wished to leave, he could follow up as an outpatient.   Wyvonnia Dusky, MD 11/03/21 1241

## 2021-11-03 NOTE — Consult Note (Signed)
[facility] ED ASSESSMENT   Reason for Consult:  Psychiatric evaluation. Referring Physician:  ER Physician Patient Identification: Barry Moore MRN:  [id number] ED Chief Complaint: Severe alcohol withdrawal without perceptual disturbances without complication [facility])  Diagnosis:  Principal Problem:   Severe alcohol withdrawal without perceptual disturbances without complication (HCC) Active Problems:   Major depressive disorder, recurrent severe without psychotic features [facility])   ED Assessment Time Calculation: No data recorded  Subjective:   [name] is a 56 y.o. male patient admitted with hx significant for .Depression and Alcohol dependence and alcohol withdrawal seizures came unaccompanied to the ER with c/o of SI, Depression.  Patient frequents the ER weekly and at times more frequently for alcohol detox treatment.  He is homeless as well.  He was brought in by EMS for complaint of suicidal ideation with plan to drink himself to death.    HPI:  Patient was seen by this provider for evaluation.  He reported feeling depressed  this morning and rated depression 9/10 with 10 being severe depression.   He also endorses suicide ideation with no plan.   Patient reported that he is depressed, poor motivation, low energy and isolating himself.  He also reported poor sleep and appetite, feelings of guilt and worthlessness and hopelessness.  He feels bad that he is unable to help his sister in [place] whose boyfriend is maltreating and he cannot help.  Patient is unemployed at this time and remains homeless.  He appears disheveled, tired and unkempt.  He barely made eye contact with provider.  His Alcohol level on arrival was 303 and LFT are slightly elevated.  Patient is feeling suicidal, worthless and depressed we will pursue inpatient mental health care.  We will resume home medications while waiting for bed.  Past Psychiatric History:  Alcohol dependence, Depression, anxiety.  Self reported one time  OD on Lithium. Previous inpatient hospitalization at [facility].  Risk to Self or Others: Is the patient at risk to self? Yes Has the patient been a risk to self in the past 6 months? Yes Has the patient been a risk to self within the distant past? Yes Is the patient a risk to others? No Has the patient been a risk to others in the past 6 months? No Has the patient been a risk to others within the distant past? No  [place] Scale:  [place] ED from 11/02/2021 in [place] DEPT ED from 10/25/2021 in [place] ED from 10/23/2021 in [place] DEPT  C-SSRS RISK CATEGORY High Risk High Risk Error: Question 6 not populated       AIMS:  , , ,  ,   ASAM: ASAM Multidimensional Assessment Summary Dimension 1:  Description of individual's past and current experiences of substance use and withdrawal: Pt has a history of heavy alcohol use, pt reports, shaking, nausea, and irritablity. DImension 1:  Acute Intoxication and/or Withdrawal Potential Severity Rating: Severe Dimension 2:  Description of patient's biomedical conditions and  complications: Per chart, pt has a history of seizures and a multitude of other medical issues. Dimension 2:  Biomedical Conditions and Complications Severity Rating: Severe Dimension 3:  Description of emotional, behavioral, or cognitive conditions and complications: Pt has history of depressive symptoms. Dimension 3:  Emotional, behavioral or cognitive (EBC) conditions and complications severity rating: Severe Dimension 4:  Description of Readiness to Change criteria: Pt feels he cannot stop drinking Dimension 4:  Readiness to Change  Severity Rating: Severe Dimension 5:  Relapse, continued use, or continued problem potential critiera description: Pt has been unable to maintain his soberity. Dimension 5:  Relapse, continued use, or continued problem potential severity rating:  Severe Dimension 6:  Recovery/Iiving environment criteria description: Unsheltered homeless Dimension 6:  Recovery/living environment severity rating: Severe ASAM's Severity Rating Score: 15 ASAM Recommended Level of Treatment: Level III Residential Treatment  Substance Abuse:  Alcohol / Drug Use Pain Medications: See MAR Prescriptions: See MAR Over the Counter: See MAR History of alcohol / drug use?: Yes Longest period of sobriety (when/how long): Unknown Negative Consequences of Use: Financial, Legal, Personal relationships, Work / School Withdrawal Symptoms: Nausea / Vomiting, Irritability, Tremors, Seizures Onset of Seizures: Unknown Date of most recent seizure: Approximately one week ago  Past Medical History:  Past Medical History:  Diagnosis Date   Alcoholic hepatitis    Depression    ETOH abuse    Gastric bleed 08/2018   Hypertension    Pancreatitis    Seizures ([place])    Suicidal behavior     Past Surgical History:  Procedure Laterality Date   BIOPSY  08/05/2018   Procedure: BIOPSY;  Surgeon: Irving Copas., MD;  Location: [facility] ENDOSCOPY;  Service: Gastroenterology;;   BIOPSY  08/07/2018   Procedure: BIOPSY;  Surgeon: Thornton Park, MD;  Location: [facility] ENDOSCOPY;  Service: Gastroenterology;;   BIOPSY  01/02/2019   Procedure: BIOPSY;  Surgeon: Gatha Mayer, MD;  Location: [facility] ENDOSCOPY;  Service: Endoscopy;;   COLONOSCOPY WITH PROPOFOL N/A 08/07/2018   Procedure: COLONOSCOPY WITH PROPOFOL;  Surgeon: Thornton Park, MD;  Location: [place];  Service: Gastroenterology;  Laterality: N/A;   ESOPHAGOGASTRODUODENOSCOPY N/A 01/02/2019   Procedure: ESOPHAGOGASTRODUODENOSCOPY (EGD);  Surgeon: Gatha Mayer, MD;  Location: [facility] ENDOSCOPY;  Service: Endoscopy;  Laterality: N/A;   ESOPHAGOGASTRODUODENOSCOPY N/A 07/03/2019   Procedure: ESOPHAGOGASTRODUODENOSCOPY (EGD);  Surgeon: Wonda Horner, MD;  Location: [facility] ENDOSCOPY;  Service: Endoscopy;  Laterality: N/A;    ESOPHAGOGASTRODUODENOSCOPY N/A 06/26/2020   Procedure: ESOPHAGOGASTRODUODENOSCOPY (EGD);  Surgeon: Arta Silence, MD;  Location: [facility] ENDOSCOPY;  Service: Endoscopy;  Laterality: N/A;   ESOPHAGOGASTRODUODENOSCOPY (EGD) WITH PROPOFOL N/A 11/02/2016   Procedure: ESOPHAGOGASTRODUODENOSCOPY (EGD) WITH PROPOFOL;  Surgeon: Carol Ada, MD;  Location: [facility] ENDOSCOPY;  Service: Endoscopy;  Laterality: N/A;   ESOPHAGOGASTRODUODENOSCOPY (EGD) WITH PROPOFOL N/A 08/05/2018   Procedure: ESOPHAGOGASTRODUODENOSCOPY (EGD) WITH PROPOFOL;  Surgeon: Rush Landmark Telford Nab., MD;  Location: [place];  Service: Gastroenterology;  Laterality: N/A;   ESOPHAGOGASTRODUODENOSCOPY (EGD) WITH PROPOFOL N/A 07/14/2019   Procedure: ESOPHAGOGASTRODUODENOSCOPY (EGD) WITH PROPOFOL;  Surgeon: Otis Brace, MD;  Location: [facility] ENDOSCOPY;  Service: Gastroenterology;  Laterality: N/A;   HERNIA REPAIR     LEG SURGERY     POLYPECTOMY  08/07/2018   Procedure: POLYPECTOMY;  Surgeon: Thornton Park, MD;  Location: [facility] ENDOSCOPY;  Service: Gastroenterology;;   Family History:  Family History  Problem Relation Age of Onset   Diabetes Mother    Alcoholism Mother    Emphysema Father    Lung cancer Father    Alcoholism Father    Family Psychiatric  History: UNKNOWN Social History:  Social History   Substance and Sexual Activity  Alcohol Use Yes   Comment: 5+ BEERS DAILY, Patient does not know how much he drank tonight     Social History   Substance and Sexual Activity  Drug Use Not Currently   Frequency: 3.0 times per week   Types: Cocaine    Social History   Socioeconomic History  Marital status: Divorced    Spouse name: Not on file   Number of children: Not on file   Years of education: Not on file   Highest education level: Not on file  Occupational History   Not on file  Tobacco Use   Smoking status: Every Day    Packs/day: 1.00    Types: Cigarettes   Smokeless tobacco: Never  Vaping Use   Vaping Use:  Never used  Substance and Sexual Activity   Alcohol use: Yes    Comment: 5+ BEERS DAILY, Patient does not know how much he drank tonight   Drug use: Not Currently    Frequency: 3.0 times per week    Types: Cocaine   Sexual activity: Not Currently  Other Topics Concern   Not on file  Social History Narrative   Not on file   Social Determinants of Health   Financial Resource Strain: Not on file  Food Insecurity: Not on file  Transportation Needs: Not on file  Physical Activity: Not on file  Stress: Not on file  Social Connections: Not on file   Additional Social History:    Allergies:   Allergies  Allergen Reactions   Tomato Shortness Of Breath and Nausea And Vomiting   Aspirin Nausea And Vomiting   Sulfa Antibiotics Other (See Comments)    "Does not make me feel good"    Labs:  Results for orders placed or performed during the hospital encounter of 11/02/21 (from the past 48 hour(s))  Comprehensive metabolic panel     Status: Abnormal   Collection Time: 11/02/21  8:40 PM  Result Value Ref Range   Sodium 139 135 - 145 mmol/L   Potassium 4.1 3.5 - 5.1 mmol/L   Chloride 108 98 - 111 mmol/L   CO2 23 22 - 32 mmol/L   Glucose, Bld 97 70 - 99 mg/dL    Comment: Glucose reference range applies only to samples taken after fasting for at least 8 hours.   BUN 8 6 - 20 mg/dL   Creatinine, Ser 0.66 0.61 - 1.24 mg/dL   Calcium 8.5 (L) 8.9 - 10.3 mg/dL   Total Protein 7.7 6.5 - 8.1 g/dL   Albumin 3.8 3.5 - 5.0 g/dL   AST 110 (H) 15 - 41 U/L   ALT 64 (H) 0 - 44 U/L   Alkaline Phosphatase 190 (H) 38 - 126 U/L   Total Bilirubin 0.5 0.3 - 1.2 mg/dL   GFR, Estimated >60 >60 mL/min    Comment: (NOTE) Calculated using the CKD-EPI Creatinine Equation (2021)    Anion gap 8 5 - 15    Comment: Performed at [facility], [place] [address] [place], [place] [address]  Ethanol     Status: Abnormal   Collection Time: 11/02/21  8:40 PM  Result Value Ref Range   Alcohol,  Ethyl (B) 303 (HH) <10 mg/dL    Comment: Marina Gravel rn @ 2132 11/02/2021 [place], K (NOTE) Lowest detectable limit for serum alcohol is 10 mg/dL.  For medical purposes only. Performed at [facility], [place] [address], [place], [place] [address]   Salicylate level     Status: Abnormal   Collection Time: 11/02/21  8:40 PM  Result Value Ref Range   Salicylate Lvl <0.8 (L) 7.0 - 30.0 mg/dL    Comment: Performed at [facility], [place] [address] [place], [place] [address]  Acetaminophen level     Status: Abnormal   Collection Time:  11/02/21  8:40 PM  Result Value Ref Range   Acetaminophen (Tylenol), Serum <10 (L) 10 - 30 ug/mL    Comment: (NOTE) Therapeutic concentrations vary significantly. A range of 10-30 ug/mL  may be an effective concentration for many patients. However, some  are best treated at concentrations outside of this range. Acetaminophen concentrations >150 ug/mL at 4 hours after ingestion  and >50 ug/mL at 12 hours after ingestion are often associated with  toxic reactions.  Performed at [facility], [place] [address], [place], [place] [address]   cbc     Status: Abnormal   Collection Time: 11/02/21  8:40 PM  Result Value Ref Range   WBC 6.3 4.0 - 10.5 K/uL   RBC 3.61 (L) 4.22 - 5.81 MIL/uL   Hemoglobin 10.0 (L) 13.0 - 17.0 g/dL   HCT 31.4 (L) 39.0 - 52.0 %   MCV 87.0 80.0 - 100.0 fL   MCH 27.7 26.0 - 34.0 pg   MCHC 31.8 30.0 - 36.0 g/dL   RDW 18.4 (H) 11.5 - 15.5 %   Platelets 218 150 - 400 K/uL   nRBC 0.0 0.0 - 0.2 %    Comment: Performed at [facility], [place] [address], [place], [place] [address]  Rapid urine drug screen (hospital performed)     Status: Abnormal   Collection Time: 11/02/21  8:40 PM  Result Value Ref Range   Opiates NONE DETECTED NONE DETECTED   Cocaine NONE DETECTED NONE DETECTED   Benzodiazepines NONE DETECTED NONE DETECTED   Amphetamines NONE DETECTED NONE  DETECTED   Tetrahydrocannabinol NONE DETECTED NONE DETECTED   Barbiturates POSITIVE (A) NONE DETECTED    Comment: (NOTE) DRUG SCREEN FOR MEDICAL PURPOSES ONLY.  IF CONFIRMATION IS NEEDED FOR ANY PURPOSE, NOTIFY LAB WITHIN 5 DAYS.  LOWEST DETECTABLE LIMITS FOR URINE DRUG SCREEN Drug Class                     Cutoff (ng/mL) Amphetamine and metabolites    1000 Barbiturate and metabolites    200 Benzodiazepine                 166 Tricyclics and metabolites     300 Opiates and metabolites        300 Cocaine and metabolites        300 THC                            50 Performed at [facility], [place] [address] [place], [place] [address]     Current Facility-Administered Medications  Medication Dose Route Frequency Provider Last Rate Last Admin   citalopram (CELEXA) tablet 10 mg  10 mg Oral Daily Tywan Siever C, NP       LORazepam (ATIVAN) injection 0-4 mg  0-4 mg Intravenous Q6H Wyvonnia Dusky, MD       Or   LORazepam (ATIVAN) tablet 0-4 mg  0-4 mg Oral Q6H Wyvonnia Dusky, MD   1 mg at 11/03/21 1255   [START ON 11/05/2021] LORazepam (ATIVAN) injection 0-4 mg  0-4 mg Intravenous Q12H Wyvonnia Dusky, MD       Or   Derrill Memo ON 11/05/2021] LORazepam (ATIVAN) tablet 0-4 mg  0-4 mg Oral Q12H Trifan, Carola Rhine, MD       LORazepam (ATIVAN) tablet 1 mg  1 mg Oral Q8H PRN Delfin Gant, NP  thiamine (VITAMIN B1) tablet 100 mg  100 mg Oral Daily Wyvonnia Dusky, MD   100 mg at 11/03/21 1255   Or   thiamine (VITAMIN B1) injection 100 mg  100 mg Intravenous Daily Trifan, Carola Rhine, MD       traZODone (DESYREL) tablet 50 mg  50 mg Oral QHS PRN Delfin Gant, NP       Current Outpatient Medications  Medication Sig Dispense Refill   citalopram (CELEXA) 10 MG tablet Take 1 tablet (10 mg total) by mouth daily. 7 tablet 0   amLODipine (NORVASC) 5 MG tablet Take 1 tablet (5 mg total) by mouth daily. (Patient not taking: Reported on 08/26/2021) 30 tablet 0    chlordiazePOXIDE (LIBRIUM) 25 MG capsule '50mg'$  PO TID x 1D, then 25-'50mg'$  PO BID X 1D, then 25-'50mg'$  PO QD X 1D (Patient not taking: Reported on 11/02/2021) 10 capsule 0   folic acid (FOLVITE) 1 MG tablet Take 1 tablet (1 mg total) by mouth daily. (Patient not taking: Reported on 08/26/2021) 30 tablet 0   Multiple Vitamin (MULTIVITAMIN WITH MINERALS) TABS tablet Take 1 tablet by mouth daily. (Patient not taking: Reported on 08/26/2021)     thiamine 100 MG tablet Take 1 tablet (100 mg total) by mouth daily. (Patient not taking: Reported on 08/26/2021) 30 tablet 0    Musculoskeletal: Strength & Muscle Tone:  in the stretcher lying down Gait & Station:  lying down in stretcher Patient leans:  see above   Psychiatric Specialty Exam: Presentation  General Appearance: Disheveled; Casual  Eye Contact:Good  Speech:Clear and Coherent; Normal Rate  Speech Volume:Normal  Handedness:Right   Mood and Affect  Mood:Depressed; Anxious; Worthless  Affect:Congruent   Surveyor, mining; Goal Directed; Linear  Descriptions of Associations:Intact  Orientation:Full (Time, Place and Person)  Thought Content:Logical  History of Schizophrenia/Schizoaffective disorder:No  Duration of Psychotic Symptoms:N/A  Hallucinations:Hallucinations: None  Ideas of Reference:None  Suicidal Thoughts:Suicidal Thoughts: Yes, Active SI Active Intent and/or Plan: -- (no plans but admitted does not own a gun)  Homicidal Thoughts:Homicidal Thoughts: No   Sensorium  Memory:Immediate Good; Recent Good; Remote Fair  Judgment:Fair  Insight:Good   Executive Functions  Concentration:Fair  Attention Span:Fair  [place]  Language:Good   Psychomotor Activity  Psychomotor Activity:Psychomotor Activity: Tremor; Other (comment) (Alcohol withdrawal shakes.)   Assets  Assets:Communication Skills; Desire for Improvement    Sleep  Sleep:Sleep:  Fair   Physical Exam: Physical Exam Vitals and nursing note reviewed.  Constitutional:      Comments: Disheveled, unkempt  HENT:     Nose: Nose normal.  Cardiovascular:     Rate and Rhythm: Normal rate.  Pulmonary:     Effort: Pulmonary effort is normal.  Musculoskeletal:        General: Normal range of motion.     Cervical back: Normal range of motion.  Skin:    General: Skin is warm and dry.  Neurological:     Mental Status: He is alert and oriented to person, place, and time.    Review of Systems  Constitutional:  Positive for diaphoresis.       Withdrawing from alcohol.  HENT: Negative.    Eyes: Negative.   Respiratory: Negative.    Cardiovascular: Negative.   Gastrointestinal: Negative.   Genitourinary: Negative.   Musculoskeletal: Negative.   Skin: Negative.   Neurological:  Positive for tremors.       Shakes, hx alcohol withdrawal seizures.  Endo/Heme/Allergies: Negative.  Psychiatric/Behavioral:  Positive for depression and suicidal ideas. The patient is nervous/anxious.    Blood pressure (!) 180/84, pulse 86, temperature 98.1 F (36.7 C), temperature source Oral, resp. rate 16, SpO2 98 %. There is no height or weight on file to calculate BMI.  Medical Decision Making: Patient feels depressed and rated depression 9/10 with 10 being severe depression.  He is not only dealing with Alcohol he feels depressed, worthless and hopeless.  He remains suicidal with no plans.  Patient meets criteria for inpatient Psychiatric unit for treatment of depression as well as monitor for Alcohol withdrawal seizures..  Start Celexa 10 mg po daily and offer Trazodone as needed for sleep.  Problem 1: Severe Alcohol withdrawal without Perceptual Disturbances without complication.  Problem 2: Recurrent Major Depressive disorder, severe without Psychotic features. Disposition:  Admit, seek placement at any facility that has available psychiatric bed.  Delfin Gant,  NP-PMHNP-BC 11/03/2021 2:38 PM
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
## 2021-11-03 NOTE — BH Assessment (Signed)
[facility] Assessment Progress Note   Per Charmaine Downs, NP this voluntary pt is to be held overnight for observation.  EDP Octaviano Glow, MD and pt's nurse, Destiny, have been notified.  Jalene Mullet, [place] Coordinator [phone]

## 2021-11-03 NOTE — ED Provider Notes (Signed)
[facility] DEPT Provider Note  CSN: [id number] Arrival date & time: 11/02/21 2017  Chief Complaint(s) Alcohol Intoxication and Suicidal  HPI Barry Moore is a 56 y.o. male with PMH alcohol abuse, pancreatitis, seizures, previous suicidal behavior who presents the emergency department for evaluation of alcohol use, alcohol intoxication and suicidal ideation.  Patient states that he is feeling particularly down and wants to kill himself.  He states that he drank an unknown quantity of beer today.  He denies additional substance use.  Denies additional coingestions.  He endorses active SI, but denies HI, AH, VH.  Patient is not currently in alcohol withdrawal.   Past Medical History Past Medical History:  Diagnosis Date   Alcoholic hepatitis    Depression    ETOH abuse    Gastric bleed 08/2018   Hypertension    Pancreatitis    Seizures ([place])    Suicidal behavior    Patient Active Problem List   Diagnosis Date Noted   MDD (major depressive disorder), recurrent severe, without psychosis ([place]) 07/16/2021   GIB (gastrointestinal bleeding) 06/25/2020   Suicidal ideations 05/31/2020   Nicotine dependence, cigarettes, uncomplicated 14/43/1540   Alcohol abuse with intoxication ([place]) 07/19/2019   Orthostatic syncope 07/19/2019   Upper GI bleeding 07/19/2019   Acute upper GI bleeding 07/02/2019   Duodenitis    Acute upper GI bleed 01/01/2019   Hypokalemia 01/01/2019   Alcohol use disorder, severe, dependence ([place]) 08/67/6195   Alcoholic liver disease ([place]) 01/01/2019   Acute pancreatitis 12/13/2018   Free intraperitoneal air 12/13/2018   Homicidal ideation    Gastrointestinal hemorrhage with melena 08/31/2018   Colon ulcer    Polyp of ascending colon    Acute GI bleeding 08/05/2018   Transaminitis 08/05/2018   Abdominal pain 08/05/2018   Nausea and vomiting 08/05/2018   Substance induced mood disorder ([place]) 07/16/2017   Severe episode of  recurrent major depressive disorder, without psychotic features ([place]) 05/15/2017   Microcytic anemia 03/18/2017   Blood loss anemia 02/21/2017   Upper GI bleed 02/08/2017   Alcohol withdrawal ([place]) 02/08/2017   Cocaine abuse ([place]) 11/18/2016   GI bleed 11/01/2016   Alcohol abuse    Major depressive disorder, recurrent severe without psychotic features ([place]) 07/06/2016   Alcohol-induced mood disorder ([place]) 06/15/2016   Seizure ([place]) 05/26/2016   Tobacco abuse 05/26/2016   Homeless 05/26/2016   Essential hypertension 05/26/2016   Alcohol dependence with alcohol-induced mood disorder ([place]) 02/14/2016   Severe alcohol withdrawal without perceptual disturbances without complication ([place]) 09/32/6712   Alcoholic hepatitis without ascites 02/14/2016   Thrombocytopenia ([place]) 02/14/2016   Home Medication(s) Prior to Admission medications   Medication Sig Start Date End Date Taking? Authorizing Provider  citalopram (CELEXA) 10 MG tablet Take 1 tablet (10 mg total) by mouth daily. 10/29/21  Yes Vesta Mixer, NP  amLODipine (NORVASC) 5 MG tablet Take 1 tablet (5 mg total) by mouth daily. Patient not taking: Reported on 08/26/2021 08/22/21   Aline August, MD  chlordiazePOXIDE (LIBRIUM) 25 MG capsule '50mg'$  PO TID x 1D, then 25-'50mg'$  PO BID X 1D, then 25-'50mg'$  PO QD X 1D Patient not taking: Reported on 11/02/2021 10/26/21   Garald Balding, PA-C  folic acid (FOLVITE) 1 MG tablet Take 1 tablet (1 mg total) by mouth daily. Patient not taking: Reported on 08/26/2021 08/22/21   Aline August, MD  Multiple Vitamin (MULTIVITAMIN WITH MINERALS) TABS tablet Take 1 tablet by mouth daily. Patient not taking: Reported on 08/26/2021 08/22/21  Aline August, MD  thiamine 100 MG tablet Take 1 tablet (100 mg total) by mouth daily. Patient not taking: Reported on 08/26/2021 08/22/21   Aline August, MD  ferrous sulfate 325 (65 FE) MG tablet Take 1 tablet (325 mg total) by mouth daily with breakfast. Patient not taking:  Reported on 09/23/2018 08/09/18 10/09/18  Elodia Florence., MD  pantoprazole (PROTONIX) 40 MG tablet Take 1 tablet (40 mg total) by mouth 2 (two) times daily before a meal. Patient not taking: Reported on 08/14/2020 06/27/20 08/14/20  Charlynne Cousins, MD                                                                                                                                    Past Surgical History Past Surgical History:  Procedure Laterality Date   BIOPSY  08/05/2018   Procedure: BIOPSY;  Surgeon: Irving Copas., MD;  Location: [facility] ENDOSCOPY;  Service: Gastroenterology;;   BIOPSY  08/07/2018   Procedure: BIOPSY;  Surgeon: Thornton Park, MD;  Location: [facility];  Service: Gastroenterology;;   BIOPSY  01/02/2019   Procedure: BIOPSY;  Surgeon: Gatha Mayer, MD;  Location: [facility] ENDOSCOPY;  Service: Endoscopy;;   COLONOSCOPY WITH PROPOFOL N/A 08/07/2018   Procedure: COLONOSCOPY WITH PROPOFOL;  Surgeon: Thornton Park, MD;  Location: [facility];  Service: Gastroenterology;  Laterality: N/A;   ESOPHAGOGASTRODUODENOSCOPY N/A 01/02/2019   Procedure: ESOPHAGOGASTRODUODENOSCOPY (EGD);  Surgeon: Gatha Mayer, MD;  Location: [facility];  Service: Endoscopy;  Laterality: N/A;   ESOPHAGOGASTRODUODENOSCOPY N/A 07/03/2019   Procedure: ESOPHAGOGASTRODUODENOSCOPY (EGD);  Surgeon: Wonda Horner, MD;  Location: [facility] ENDOSCOPY;  Service: Endoscopy;  Laterality: N/A;   ESOPHAGOGASTRODUODENOSCOPY N/A 06/26/2020   Procedure: ESOPHAGOGASTRODUODENOSCOPY (EGD);  Surgeon: Arta Silence, MD;  Location: [facility];  Service: Endoscopy;  Laterality: N/A;   ESOPHAGOGASTRODUODENOSCOPY (EGD) WITH PROPOFOL N/A 11/02/2016   Procedure: ESOPHAGOGASTRODUODENOSCOPY (EGD) WITH PROPOFOL;  Surgeon: Carol Ada, MD;  Location: [facility] ENDOSCOPY;  Service: Endoscopy;  Laterality: N/A;   ESOPHAGOGASTRODUODENOSCOPY (EGD) WITH PROPOFOL N/A 08/05/2018   Procedure: ESOPHAGOGASTRODUODENOSCOPY (EGD) WITH PROPOFOL;   Surgeon: Rush Landmark Telford Nab., MD;  Location: [facility];  Service: Gastroenterology;  Laterality: N/A;   ESOPHAGOGASTRODUODENOSCOPY (EGD) WITH PROPOFOL N/A 07/14/2019   Procedure: ESOPHAGOGASTRODUODENOSCOPY (EGD) WITH PROPOFOL;  Surgeon: Otis Brace, MD;  Location: [facility] ENDOSCOPY;  Service: Gastroenterology;  Laterality: N/A;   HERNIA REPAIR     LEG SURGERY     POLYPECTOMY  08/07/2018   Procedure: POLYPECTOMY;  Surgeon: Thornton Park, MD;  Location[facility] ENDOSCOPY;  Service: Gastroenterology;;   Family History Family History  Problem Relation Age of Onset   Diabetes Mother    Alcoholism Mother    Emphysema Father    Lung cancer Father    Alcoholism Father     Social History Social History   Tobacco Use   Smoking status: Every Day    Packs/day: 1.00    Types: Cigarettes  Smokeless tobacco: Never  Vaping Use   Vaping Use: Never used  Substance Use Topics   Alcohol use: Yes    Comment: 5+ BEERS DAILY, Patient does not know how much he drank tonight   Drug use: Not Currently    Frequency: 3.0 times per week    Types: Cocaine   Allergies Tomato, Aspirin, and Sulfa antibiotics  Review of Systems Review of Systems  Psychiatric/Behavioral:  Positive for suicidal ideas.     Physical Exam Vital Signs  I have reviewed the triage vital signs BP 137/80 (BP Location: Left Arm)   Pulse 94   Temp 98.9 F (37.2 C) (Oral)   Resp 18   SpO2 100%   Physical Exam Constitutional:      General: He is not in acute distress.    Appearance: Normal appearance.  HENT:     Head: Normocephalic and atraumatic.     Nose: No congestion or rhinorrhea.  Eyes:     General:        Right eye: No discharge.        Left eye: No discharge.     Extraocular Movements: Extraocular movements intact.     Pupils: Pupils are equal, round, and reactive to light.  Cardiovascular:     Rate and Rhythm: Normal rate and regular rhythm.     Heart sounds: No murmur heard. Pulmonary:      Effort: No respiratory distress.     Breath sounds: No wheezing or rales.  Abdominal:     General: There is no distension.     Tenderness: There is no abdominal tenderness.  Musculoskeletal:        General: Normal range of motion.     Cervical back: Normal range of motion.  Skin:    General: Skin is warm and dry.  Neurological:     General: No focal deficit present.     Mental Status: He is alert.     ED Results and Treatments Labs (all labs ordered are listed, but only abnormal results are displayed) Labs Reviewed  COMPREHENSIVE METABOLIC PANEL - Abnormal; Notable for the following components:      Result Value   Calcium 8.5 (*)    AST 110 (*)    ALT 64 (*)    Alkaline Phosphatase 190 (*)    All other components within normal limits  ETHANOL - Abnormal; Notable for the following components:   Alcohol, Ethyl (B) 303 (*)    All other components within normal limits  SALICYLATE LEVEL - Abnormal; Notable for the following components:   Salicylate Lvl <9.7 (*)    All other components within normal limits  ACETAMINOPHEN LEVEL - Abnormal; Notable for the following components:   Acetaminophen (Tylenol), Serum <10 (*)    All other components within normal limits  CBC - Abnormal; Notable for the following components:   RBC 3.61 (*)    Hemoglobin 10.0 (*)    HCT 31.4 (*)    RDW 18.4 (*)    All other components within normal limits  RAPID URINE DRUG SCREEN, HOSP PERFORMED - Abnormal; Notable for the following components:   Barbiturates POSITIVE (*)    All other components within normal limits  Radiology No results found.  Pertinent labs & imaging results that were available during my care of the patient were reviewed by me and considered in my medical decision making (see MDM for details).  Medications Ordered in ED Medications - No data to display                                                                                                                                    Procedures Procedures  (including critical care time)  Medical Decision Making / ED Course   This patient presents to the ED for concern of SI, alcohol intoxication, this involves an extensive number of treatment options, and is a complaint that carries with it a high risk of complications and morbidity.  The differential diagnosis includes alcohol use disorder, polysubstance use disorder, conditional suicidality, major depression, psychosis  MDM: Patient seen in the emergency department for evaluation of multiple complaints as described above.  Physical exam reveals a disheveled individual but is otherwise unremarkable with no active tremors or clinical evidence of alcohol withdrawal.  Initial laboratory evaluation with an alcohol level of 303 which is similar to other presentations most recently 9 days ago, AST 110, ALT 64 which is consistent with his alcohol use, hemoglobin 10.0.  Patient is alert and oriented and a TTS consult was placed for his suicidal ideation.  Patient disposition pending suicidal ideation but at this time he is medically cleared once he has metabolized his current alcohol.   Additional history obtained:  -External records from outside source obtained and reviewed including: Chart review including previous notes, labs, imaging, consultation notes   Lab Tests: -I ordered, reviewed, and interpreted labs.   The pertinent results include:   Labs Reviewed  COMPREHENSIVE METABOLIC PANEL - Abnormal; Notable for the following components:      Result Value   Calcium 8.5 (*)    AST 110 (*)    ALT 64 (*)    Alkaline Phosphatase 190 (*)    All other components within normal limits  ETHANOL - Abnormal; Notable for the following components:   Alcohol, Ethyl (B) 303 (*)    All other components within normal limits  SALICYLATE LEVEL - Abnormal; Notable  for the following components:   Salicylate Lvl <0.6 (*)    All other components within normal limits  ACETAMINOPHEN LEVEL - Abnormal; Notable for the following components:   Acetaminophen (Tylenol), Serum <10 (*)    All other components within normal limits  CBC - Abnormal; Notable for the following components:   RBC 3.61 (*)    Hemoglobin 10.0 (*)    HCT 31.4 (*)    RDW 18.4 (*)    All other components within normal limits  RAPID URINE DRUG SCREEN, HOSP PERFORMED - Abnormal; Notable for the following components:   Barbiturates POSITIVE (*)    All other components within normal limits        Medicines  ordered and prescription drug management: No orders of the defined types were placed in this encounter.   -I have reviewed the patients home medicines and have made adjustments as needed  Critical interventions none  Consultations Obtained: I requested consultation with the TTS,  and discussed lab and imaging findings as well as pertinent plan - they recommend: recs pending   Cardiac Monitoring: The patient was maintained on a cardiac monitor.  I personally viewed and interpreted the cardiac monitored which showed an underlying rhythm of: NSR  Social Determinants of Health:  Factors impacting patients care include: Daily alcohol use   Reevaluation: After the interventions noted above, I reevaluated the patient and found that they have :stayed the same  Co morbidities that complicate the patient evaluation  Past Medical History:  Diagnosis Date   Alcoholic hepatitis    Depression    ETOH abuse    Gastric bleed 08/2018   Hypertension    Pancreatitis    Seizures ([facility])    Suicidal behavior       Dispostion: I considered admission for this patient, and disposition is pending TTS evaluation     Final Clinical Impression(s) / ED Diagnoses Final diagnoses:  None     '@PCDICTATION'$ @    Teressa Lower, MD 11/03/21 [phone]

## 2021-11-03 NOTE — ED Notes (Signed)
Pt ambulatory to restroom w/out assistance.  

## 2021-11-03 NOTE — ED Notes (Signed)
TTS in progress 

## 2021-11-03 NOTE — ED Notes (Signed)
Pt given burgundy scrubs and instructed to change into them. Will check back to remove belongings from room.

## 2021-11-04 MED ORDER — CHLORDIAZEPOXIDE HCL 25 MG PO CAPS: ORAL_CAPSULE | ORAL | 0 refills | Status: DC

## 2021-11-04 NOTE — Discharge Instructions (Addendum)
Go to the behavioral health urgent care sooner if you feel like you are going to harm yourself       [address] [place], [place] [address]      [phone]      They offer psychiatry/medication management and therapy.  New patients are seen in their walk-in clinic.  Walk-in hours are Monday, Wednesday, Thursday and Friday from 8:00 am - 11:00 am for psychiatry, and Monday and Wednesday from 8:00 am - 11:00 am for therapy.  Walk-in patients are seen on a first come, first served basis, so try to arrive as early as possible for the best chance of being seen the same day.  BE SURE TO TAKE THE ELEVATOR TO THE SECOND FLOOR.  Please note that to be eligible for services you must bring an ID or a piece of mail with your name and a [facility] address.  For your shelter needs, contact the following service providers:       [facility] (operated [facility])      [place], [place] [address]      [phone]       [facility]      [address]      [place], [place] [address]      [phone]  For day shelter and other supportive services for the homeless, contact the [facility]):       [facility]      [place], [place] [address]      [phone]  For transitional housing, Secondary school teacher.  They provide longer term housing than a shelter, but there is an application process:       [facility] [employer] [facility] [place] [address],  [address]      [phone]

## 2021-11-04 NOTE — ED Notes (Signed)
Pt. Verbalized understanding and comprehension of AVS. Pt. Stated no questions, comments or concerns regarding follow-up care and paperwork.

## 2021-11-04 NOTE — BH Assessment (Signed)
[facility] Assessment Progress Note   Per Lacretia Leigh, MD, this voluntary pt does not require psychiatric hospitalization at this time.  Pt is psychiatrically cleared.  Discharge instructions include referral information for [facility], along with area supportive services for the homeless.  Dr Zenia Resides and pt's nurse, Laverle Patter, have been notified.  Jalene Mullet, [place] Triage Specialist [phone]

## 2021-11-04 NOTE — ED Provider Notes (Signed)
Emergency Medicine Observation Re-evaluation Note  Barry Moore is a 56 y.o. male, seen on rounds today.  Pt initially presented to the ED for complaints of Alcohol Intoxication and Suicidal Currently, the patient is resting comfortably in the hallway.  No evidence of withdrawal symptoms.  Physical Exam  BP (!) 162/81   Pulse 84   Temp 97.9 F (36.6 C) (Oral)   Resp 16   SpO2 98%  Physical Exam General: Alert oriented.  No acute distress Cardiac:  Lungs:  Psych: No active suicidal plan  ED Course / MDM  EKG:   I have reviewed the labs performed to date as well as medications administered while in observation.  Recent changes in the last 24 hours include none.  Patient is chronically suicidal related to his being homeless.  He does not have an active plan at this time.  He has no evidence of alcohol withdrawal at this time.  Does not require inpatient hospitalization.  Will give prescription for Librium as well as another referral to the behavioral urgent care center  Plan  Current plan is for discharge.  Patient is at extremely low risk for suicide at this time.  Patient seen in conjunction with psychiatric team who agrees  Barry Moore is not under involuntary commitment.     Lacretia Leigh, MD 11/04/21 [phone]

## 2021-11-08 ENCOUNTER — Other Ambulatory Visit: Payer: Self-pay

## 2021-11-08 ENCOUNTER — Emergency Department (HOSPITAL_COMMUNITY)
Admission: EM | Admit: 2021-11-08 | Discharge: 2021-11-09 | Disposition: A | Discharge status: Home / Self Care | Payer: Medicaid - Out of State | Attending: Emergency Medicine | Admitting: Emergency Medicine

## 2021-11-08 DIAGNOSIS — R569 Unspecified convulsions: Secondary | ICD-10-CM

## 2021-11-08 DIAGNOSIS — G40909 Epilepsy, unspecified, not intractable, without status epilepticus: Secondary | ICD-10-CM | POA: Diagnosis not present

## 2021-11-08 LAB — CBG MONITORING, ED: Glucose-Capillary: 90 mg/dL (ref 70–99)

## 2021-11-08 MED ORDER — LORAZEPAM 1 MG PO TABS: 0.0000 mg | ORAL_TABLET | Freq: Two times a day (BID) | ORAL | Status: DC

## 2021-11-08 MED ORDER — LORAZEPAM 1 MG PO TABS: 0.0000 mg | ORAL_TABLET | Freq: Four times a day (QID) | ORAL | Status: DC

## 2021-11-08 MED ORDER — NICOTINE 21 MG/24HR TD PT24: 21.0000 mg | MEDICATED_PATCH | Freq: Every day | TRANSDERMAL | Status: DC

## 2021-11-08 MED ORDER — THIAMINE HCL 100 MG PO TABS: 100.0000 mg | ORAL_TABLET | Freq: Every day | ORAL | Status: DC

## 2021-11-08 NOTE — ED Triage Notes (Signed)
Pt bib [facility] for seizure. Pt reports having a seizure and a friend on scene also reported this, but unable to tell duration. Pt has hx of seizures, non compliant w/ meds. VSS, aox4. 131/78, 92HR, 98% RA, cbg 109

## 2021-11-08 NOTE — ED Provider Triage Note (Signed)
Emergency Medicine Provider Triage Evaluation Note  Barry Moore , a 56 y.o. male  was evaluated in triage.  Pt complains of not wanting to be alive anymore. No suicidal plan. States he is depressed and "doesn't give a fuck". Has been drinking beer today. EMS called by bystander. No HI or AVH.   Review of Systems  Positive: As above Negative: As above  Physical Exam  BP 126/70   Pulse 92   Temp 98.6 F (37 C) (Oral)   Resp 14   SpO2 97%  Gen:   Awake, no distress   Resp:  Normal effort  MSK:   Moves extremities without difficulty  Other:  Agitated. Disheveled.  Medical Decision Making  Medically screening exam initiated at 10:51 PM.  Appropriate orders placed.  Barry Moore was informed that the remainder of the evaluation will be completed by another provider, this initial triage assessment does not replace that evaluation, and the importance of remaining in the ED until their evaluation is complete.  Alcohol induced mood d/o - no suicidal plan Hx of frequent ED utilization.  CIWA ordered. No concern for acute withdrawal at present.   Antonietta Breach, PA-C 11/08/21 [phone]

## 2021-11-09 NOTE — ED Notes (Signed)
Pt needs a bus pass to leave

## 2021-11-09 NOTE — ED Provider Notes (Signed)
[facility] EMERGENCY DEPARTMENT Provider Note   CSN: [id number] Arrival date & time: 11/08/21  2141     History  Chief Complaint  Patient presents with   Seizures    Barry Moore is a 56 y.o. male.   Seizures Patient presented via EMS.  It is reported that he was found having a seizure.  A bystander called 911.  Patient reports he has a history of seizures but freely admits he does not take any medications. He has no new complaints     Home Medications Prior to Admission medications   Medication Sig Start Date End Date Taking? Authorizing Provider  amLODipine (NORVASC) 5 MG tablet Take 1 tablet (5 mg total) by mouth daily. Patient not taking: Reported on 08/26/2021 08/22/21   Aline August, MD  chlordiazePOXIDE (LIBRIUM) 25 MG capsule '50mg'$  PO TID x 1D, then 25-'50mg'$  PO BID X 1D, then 25-'50mg'$  PO QD X 1D Patient not taking: Reported on 11/02/2021 10/26/21   Garald Balding, PA-C  chlordiazePOXIDE (LIBRIUM) 25 MG capsule '50mg'$  PO TID x 1D, then 25-'50mg'$  PO BID X 1D, then 25-'50mg'$  PO QD X 1D 11/04/21   Lacretia Leigh, MD  citalopram (CELEXA) 10 MG tablet Take 1 tablet (10 mg total) by mouth daily. 10/29/21   Vesta Mixer, NP  folic acid (FOLVITE) 1 MG tablet Take 1 tablet (1 mg total) by mouth daily. Patient not taking: Reported on 08/26/2021 08/22/21   Aline August, MD  Multiple Vitamin (MULTIVITAMIN WITH MINERALS) TABS tablet Take 1 tablet by mouth daily. Patient not taking: Reported on 08/26/2021 08/22/21   Aline August, MD  thiamine 100 MG tablet Take 1 tablet (100 mg total) by mouth daily. Patient not taking: Reported on 08/26/2021 08/22/21   Aline August, MD  ferrous sulfate 325 (65 FE) MG tablet Take 1 tablet (325 mg total) by mouth daily with breakfast. Patient not taking: Reported on 09/23/2018 08/09/18 10/09/18  Elodia Florence., MD  pantoprazole (PROTONIX) 40 MG tablet Take 1 tablet (40 mg total) by mouth 2 (two) times daily before a meal. Patient not  taking: Reported on 08/14/2020 06/27/20 08/14/20  Charlynne Cousins, MD      Allergies    Tomato, Aspirin, and Sulfa antibiotics    Review of Systems   Review of Systems  Neurological:  Positive for seizures.    Physical Exam Updated Vital Signs BP 134/73 (BP Location: Left Arm)   Pulse 81   Temp 98.3 F (36.8 C)   Resp 18   SpO2 97%  Physical Exam CONSTITUTIONAL: Disheveled, no acute distress, sleeping when I approached his bedside HEAD: Normocephalic/atraumatic ENMT: Mucous membranes moist NECK: supple no meningeal signs SPINE/BACK:entire spine nontender LUNGS: no apparent distress Chest-no bruising or tenderness noted ABDOMEN: soft, nontender, no bruising NEURO: Pt is sleeping but is arousable.  He answers questions appropriately.  He has no arm or leg drift.  He is able to ambulate EXTREMITIES: pulses normal/equal, full ROM, no obvious deformities SKIN: warm, color normal PSYCH: no abnormalities of mood noted, alert and oriented to situation  ED Results / Procedures / Treatments   Labs (all labs ordered are listed, but only abnormal results are displayed) Labs Reviewed  CBG MONITORING, ED    EKG EKG Interpretation  Date/Time:  Sunday November 08 2021 22:24:22 EDT Ventricular Rate:  98 PR Interval:  150 QRS Duration: 90 QT Interval:  338 QTC Calculation: 431 R Axis:   71 Text Interpretation: Normal sinus rhythm Confirmed by  Ripley Fraise ([id number]) on 11/09/2021 5:32:53 AM  Radiology No results found.  Procedures Procedures    Medications Ordered in ED Medications  LORazepam (ATIVAN) tablet 0-4 mg (has no administration in time range)  LORazepam (ATIVAN) tablet 0-4 mg (has no administration in time range)  thiamine (VITAMIN B1) tablet 100 mg (has no administration in time range)  nicotine (NICODERM CQ - dosed in mg/24 hours) patch 21 mg (has no administration in time range)    ED Course/ Medical Decision Making/ A&P                            Medical Decision Making  Patient presents for his 17th ER visit in 6 months. Patient presents to the ER frequently for seizures, alcohol abuse. Currently patient is resting comfortably, no acute distress.  He freely admits to noncompliance with any antiepileptic therapy. Patient has a known history of alcohol use disorder, but does not appear to be in any obvious withdrawal at this time. He did not voice suicidal plan upon arrival to the ER. No further work-up indicated at this time.  He is safe for discharge        Final Clinical Impression(s) / ED Diagnoses Final diagnoses:  Seizure [facility])    Rx / [facility] Orders ED Discharge Orders     None         Ripley Fraise, MD 11/09/21 [phone]

## 2021-11-16 ENCOUNTER — Other Ambulatory Visit: Payer: Self-pay

## 2021-11-16 ENCOUNTER — Encounter (HOSPITAL_COMMUNITY): Payer: Self-pay

## 2021-11-16 ENCOUNTER — Emergency Department (HOSPITAL_COMMUNITY)
Admission: EM | Admit: 2021-11-16 | Discharge: 2021-11-16 | Disposition: A | Discharge status: Home / Self Care | Payer: Medicaid - Out of State | Attending: Emergency Medicine | Admitting: Emergency Medicine

## 2021-11-16 ENCOUNTER — Emergency Department (HOSPITAL_COMMUNITY): Payer: Medicaid - Out of State

## 2021-11-16 DIAGNOSIS — R0602 Shortness of breath: Secondary | ICD-10-CM | POA: Insufficient documentation

## 2021-11-16 DIAGNOSIS — Z5321 Procedure and treatment not carried out due to patient leaving prior to being seen by health care provider: Secondary | ICD-10-CM | POA: Insufficient documentation

## 2021-11-16 DIAGNOSIS — E860 Dehydration: Secondary | ICD-10-CM | POA: Diagnosis not present

## 2021-11-16 DIAGNOSIS — R55 Syncope and collapse: Secondary | ICD-10-CM | POA: Diagnosis not present

## 2021-11-16 DIAGNOSIS — Z5900 Homelessness unspecified: Secondary | ICD-10-CM | POA: Insufficient documentation

## 2021-11-16 DIAGNOSIS — F1020 Alcohol dependence, uncomplicated: Secondary | ICD-10-CM | POA: Insufficient documentation

## 2021-11-16 LAB — CBC WITH DIFFERENTIAL/PLATELET
Abs Immature Granulocytes: 0.01 10*3/uL (ref 0.00–0.07)
Basophils Absolute: 0.1 10*3/uL (ref 0.0–0.1)
Basophils Relative: 2 %
Eosinophils Absolute: 0.1 10*3/uL (ref 0.0–0.5)
Eosinophils Relative: 3 %
HCT: 35.3 % — ABNORMAL LOW (ref 39.0–52.0)
Hemoglobin: 11.5 g/dL — ABNORMAL LOW (ref 13.0–17.0)
Immature Granulocytes: 0 %
Lymphocytes Relative: 45 %
Lymphs Abs: 1.9 10*3/uL (ref 0.7–4.0)
MCH: 27.4 pg (ref 26.0–34.0)
MCHC: 32.6 g/dL (ref 30.0–36.0)
MCV: 84 fL (ref 80.0–100.0)
Monocytes Absolute: 0.5 10*3/uL (ref 0.1–1.0)
Monocytes Relative: 12 %
Neutro Abs: 1.5 10*3/uL — ABNORMAL LOW (ref 1.7–7.7)
Neutrophils Relative %: 38 %
Platelets: 127 10*3/uL — ABNORMAL LOW (ref 150–400)
RBC: 4.2 MIL/uL — ABNORMAL LOW (ref 4.22–5.81)
RDW: 16.3 % — ABNORMAL HIGH (ref 11.5–15.5)
WBC: 4.1 10*3/uL (ref 4.0–10.5)
nRBC: 0 % (ref 0.0–0.2)

## 2021-11-16 LAB — COMPREHENSIVE METABOLIC PANEL
ALT: 52 U/L — ABNORMAL HIGH (ref 0–44)
AST: 103 U/L — ABNORMAL HIGH (ref 15–41)
Albumin: 4 g/dL (ref 3.5–5.0)
Alkaline Phosphatase: 197 U/L — ABNORMAL HIGH (ref 38–126)
Anion gap: 11 (ref 5–15)
BUN: <5 mg/dL — ABNORMAL LOW (ref 6–20)
CO2: 22 mmol/L (ref 22–32)
Calcium: 8.8 mg/dL — ABNORMAL LOW (ref 8.9–10.3)
Chloride: 107 mmol/L (ref 98–111)
Creatinine, Ser: 0.77 mg/dL (ref 0.61–1.24)
GFR, Estimated: >60 mL/min (ref >60)
Glucose, Bld: 93 mg/dL (ref 70–99)
Potassium: 3.7 mmol/L (ref 3.5–5.1)
Sodium: 140 mmol/L (ref 135–145)
Total Bilirubin: 0.4 mg/dL (ref 0.3–1.2)
Total Protein: 7.8 g/dL (ref 6.5–8.1)

## 2021-11-16 NOTE — ED Notes (Signed)
Pt called multiple times for a room, no answer

## 2021-11-16 NOTE — ED Provider Triage Note (Signed)
Emergency Medicine Provider Triage Evaluation Note  BEXLEY MCLESTER , a 56 y.o. male  was evaluated in triage.  Pt complains of syncopal episode, fatigue, and dehydration.  Unsure if he hit his head.  Also notes that he was dizzy prior to the fall.  Also notes shortness of breath.  Has associated nausea.  Denies vomiting.  Review of Systems  Positive: As per HPI Negative:   Physical Exam  BP 116/86 (BP Location: Right Arm)   Pulse 97   Temp 98.7 F (37.1 C) (Oral)   Resp 15   Ht '5\' 7"'$  (1.702 m)   Wt 69.4 kg   SpO2 97%   BMI 23.96 kg/m  Gen:   Awake, no distress   Resp:  Normal effort  MSK:   Moves extremities without difficulty  Other:    Medical Decision Making  Medically screening exam initiated at 1:10 PM.  Appropriate orders placed.  AMAURIS DEBOIS was informed that the remainder of the evaluation will be completed by another provider, this initial triage assessment does not replace that evaluation, and the importance of remaining in the ED until their evaluation is complete.  Work-up initiated   Julius Boniface A, PA-C 11/16/21 1315

## 2021-11-16 NOTE — ED Triage Notes (Signed)
Patient is homeless and an alcoholic and had a syncopal episode and is complaining of fatigue and dehydration.  Also complains of SOB.

## 2021-11-18 ENCOUNTER — Other Ambulatory Visit: Payer: Self-pay

## 2021-11-18 ENCOUNTER — Emergency Department (HOSPITAL_COMMUNITY)
Admission: EM | Admit: 2021-11-18 | Discharge: 2021-11-18 | Disposition: A | Discharge status: Home / Self Care | Payer: Medicaid - Out of State | Attending: Emergency Medicine | Admitting: Emergency Medicine

## 2021-11-18 ENCOUNTER — Emergency Department (HOSPITAL_COMMUNITY): Payer: Medicaid - Out of State

## 2021-11-18 DIAGNOSIS — S0990XA Unspecified injury of head, initial encounter: Secondary | ICD-10-CM | POA: Insufficient documentation

## 2021-11-18 DIAGNOSIS — M25512 Pain in left shoulder: Secondary | ICD-10-CM | POA: Insufficient documentation

## 2021-11-18 DIAGNOSIS — Z79899 Other long term (current) drug therapy: Secondary | ICD-10-CM | POA: Diagnosis not present

## 2021-11-18 DIAGNOSIS — F10120 Alcohol abuse with intoxication, uncomplicated: Secondary | ICD-10-CM | POA: Insufficient documentation

## 2021-11-18 DIAGNOSIS — S00211A Abrasion of right eyelid and periocular area, initial encounter: Secondary | ICD-10-CM | POA: Insufficient documentation

## 2021-11-18 DIAGNOSIS — M25511 Pain in right shoulder: Secondary | ICD-10-CM | POA: Diagnosis not present

## 2021-11-18 DIAGNOSIS — R569 Unspecified convulsions: Secondary | ICD-10-CM

## 2021-11-18 DIAGNOSIS — Y908 Blood alcohol level of 240 mg/100 ml or more: Secondary | ICD-10-CM | POA: Diagnosis not present

## 2021-11-18 DIAGNOSIS — R7401 Elevation of levels of liver transaminase levels: Secondary | ICD-10-CM | POA: Diagnosis not present

## 2021-11-18 DIAGNOSIS — W01198A Fall on same level from slipping, tripping and stumbling with subsequent striking against other object, initial encounter: Secondary | ICD-10-CM | POA: Diagnosis not present

## 2021-11-18 DIAGNOSIS — M542 Cervicalgia: Secondary | ICD-10-CM | POA: Diagnosis not present

## 2021-11-18 DIAGNOSIS — S0993XA Unspecified injury of face, initial encounter: Secondary | ICD-10-CM | POA: Diagnosis present

## 2021-11-18 DIAGNOSIS — F10129 Alcohol abuse with intoxication, unspecified: Secondary | ICD-10-CM

## 2021-11-18 LAB — CBC WITH DIFFERENTIAL/PLATELET
Abs Immature Granulocytes: 0.01 10*3/uL (ref 0.00–0.07)
Basophils Absolute: 0.1 10*3/uL (ref 0.0–0.1)
Basophils Relative: 2 %
Eosinophils Absolute: 0.1 10*3/uL (ref 0.0–0.5)
Eosinophils Relative: 3 %
HCT: 36.1 % — ABNORMAL LOW (ref 39.0–52.0)
Hemoglobin: 11.7 g/dL — ABNORMAL LOW (ref 13.0–17.0)
Immature Granulocytes: 0 %
Lymphocytes Relative: 40 %
Lymphs Abs: 1.8 10*3/uL (ref 0.7–4.0)
MCH: 27.6 pg (ref 26.0–34.0)
MCHC: 32.4 g/dL (ref 30.0–36.0)
MCV: 85.1 fL (ref 80.0–100.0)
Monocytes Absolute: 0.5 10*3/uL (ref 0.1–1.0)
Monocytes Relative: 12 %
Neutro Abs: 2 10*3/uL (ref 1.7–7.7)
Neutrophils Relative %: 43 %
Platelets: 107 10*3/uL — ABNORMAL LOW (ref 150–400)
RBC: 4.24 MIL/uL (ref 4.22–5.81)
RDW: 16.4 % — ABNORMAL HIGH (ref 11.5–15.5)
WBC: 4.5 10*3/uL (ref 4.0–10.5)
nRBC: 0 % (ref 0.0–0.2)

## 2021-11-18 LAB — COMPREHENSIVE METABOLIC PANEL
ALT: 48 U/L — ABNORMAL HIGH (ref 0–44)
AST: 91 U/L — ABNORMAL HIGH (ref 15–41)
Albumin: 3.7 g/dL (ref 3.5–5.0)
Alkaline Phosphatase: 178 U/L — ABNORMAL HIGH (ref 38–126)
Anion gap: 11 (ref 5–15)
BUN: <5 mg/dL — ABNORMAL LOW (ref 6–20)
CO2: 21 mmol/L — ABNORMAL LOW (ref 22–32)
Calcium: 8.4 mg/dL — ABNORMAL LOW (ref 8.9–10.3)
Chloride: 108 mmol/L (ref 98–111)
Creatinine, Ser: 0.71 mg/dL (ref 0.61–1.24)
GFR, Estimated: >60 mL/min (ref >60)
Glucose, Bld: 90 mg/dL (ref 70–99)
Potassium: 3.5 mmol/L (ref 3.5–5.1)
Sodium: 140 mmol/L (ref 135–145)
Total Bilirubin: 0.5 mg/dL (ref 0.3–1.2)
Total Protein: 7.4 g/dL (ref 6.5–8.1)

## 2021-11-18 LAB — CBG MONITORING, ED: Glucose-Capillary: 98 mg/dL (ref 70–99)

## 2021-11-18 LAB — ETHANOL: Alcohol, Ethyl (B): 336 mg/dL (ref <10)

## 2021-11-18 MED ORDER — BACITRACIN ZINC 500 UNIT/GM EX OINT: TOPICAL_OINTMENT | Freq: Once | CUTANEOUS | Status: AC

## 2021-11-18 NOTE — ED Notes (Signed)
Patient verbalizes understanding of discharge instructions. Opportunity for questioning and answers were provided. Armband removed by staff, pt discharged from ED.  

## 2021-11-18 NOTE — ED Provider Notes (Signed)
Signout from [employer] at shift change. Briefly, patient presents for alcohol intoxication, seizure, fall.  Patient with history of alcohol abuse.   Plan: Pending completion of imaging.   3:42 PM Reassessment performed. Patient appears stable.  Complains of headache and is asking for something to eat.  Evaluated right supraorbital abrasion, no deep laceration noted.  Labs and imaging personally reviewed and interpreted including: CBC with differential with minimal anemia with hemoglobin 11.7, normal white blood cell count; CMP mildly elevated transaminases consistent with history of alcohol use; ethanol 336 elevated.  CT of the head demonstrates right anterior and periorbital scalp hematoma, previously known zygomatic arch fracture on the right, chronic lacunar infarct; CT cervical spine without any acute spinal fractures.   Most current vital signs reviewed and are as follows: BP 132/79   Pulse 79   Temp 98.2 F (36.8 C) (Oral)   Resp 16   Ht '5\' 7"'$  (1.702 m)   Wt 69.4 kg   SpO2 99%   BMI 23.96 kg/m   Plan: Have patient eat and drink, ambulate.  Discharge when safe.   4:47 PM Reassessment performed. Patient appears stable.  Continues to be conversant.  He was updated on results.  He is aware that he has a right-sided zygomatic arch fracture, first noted earlier in the month.  Most current vital signs reviewed and are as follows: BP (!) 140/93   Pulse 83   Temp 98.2 F (36.8 C) (Oral)   Resp 20   Ht '5\' 7"'$  (1.702 m)   Wt 69.4 kg   SpO2 99%   BMI 23.96 kg/m   Plan: Discharge to home after bandaging of forehead abrasion.   Prescriptions written for: None  Home treatment: Wound care   Return and follow-up instructions: Encouraged return to ED with worsening pain, swelling, redness, or drainage from wound. Encouraged patient to follow-up with their provider as needed. Patient verbalized understanding and agreed with plan.        Carlisle Cater, PA-C 11/18/21 1649     Gareth Morgan, MD 11/21/21 0145

## 2021-11-18 NOTE — ED Provider Notes (Signed)
[facility] EMERGENCY DEPARTMENT Provider Note   CSN: [id number] Arrival date & time: 11/18/21  1340     History  Chief Complaint  Patient presents with   Seizures    Barry Moore is a 56 y.o. male with history of alcohol abuse and seizure disorder presenting today for for seizure.  Reports that he seized, hit his head, and lost consciousness.  Reports that he does not take medications for seizure.  Also states that he drink at least 3 "40s" of alcohol today.  States that this is the average amount if not more he consumes in a day.  Also mentioned that if he does not drink alcohol he is more likely to have a seizure.  Seizures      Home Medications Prior to Admission medications   Medication Sig Start Date End Date Taking? Authorizing Provider  amLODipine (NORVASC) 5 MG tablet Take 1 tablet (5 mg total) by mouth daily. Patient not taking: Reported on 08/26/2021 08/22/21   Aline August, MD  chlordiazePOXIDE (LIBRIUM) 25 MG capsule '50mg'$  PO TID x 1D, then 25-'50mg'$  PO BID X 1D, then 25-'50mg'$  PO QD X 1D Patient not taking: Reported on 11/02/2021 10/26/21   Garald Balding, PA-C  chlordiazePOXIDE (LIBRIUM) 25 MG capsule '50mg'$  PO TID x 1D, then 25-'50mg'$  PO BID X 1D, then 25-'50mg'$  PO QD X 1D 11/04/21   Lacretia Leigh, MD  citalopram (CELEXA) 10 MG tablet Take 1 tablet (10 mg total) by mouth daily. 10/29/21   Vesta Mixer, NP  folic acid (FOLVITE) 1 MG tablet Take 1 tablet (1 mg total) by mouth daily. Patient not taking: Reported on 08/26/2021 08/22/21   Aline August, MD  Multiple Vitamin (MULTIVITAMIN WITH MINERALS) TABS tablet Take 1 tablet by mouth daily. Patient not taking: Reported on 08/26/2021 08/22/21   Aline August, MD  thiamine 100 MG tablet Take 1 tablet (100 mg total) by mouth daily. Patient not taking: Reported on 08/26/2021 08/22/21   Aline August, MD  ferrous sulfate 325 (65 FE) MG tablet Take 1 tablet (325 mg total) by mouth daily with breakfast. Patient not  taking: Reported on 09/23/2018 08/09/18 10/09/18  Elodia Florence., MD  pantoprazole (PROTONIX) 40 MG tablet Take 1 tablet (40 mg total) by mouth 2 (two) times daily before a meal. Patient not taking: Reported on 08/14/2020 06/27/20 08/14/20  Charlynne Cousins, MD      Allergies    Tomato, Aspirin, and Sulfa antibiotics    Review of Systems   Review of Systems  Neurological:  Positive for seizures.    Physical Exam Updated Vital Signs BP 132/79   Pulse 79   Temp 98.2 F (36.8 C) (Oral)   Resp 16   Ht '5\' 7"'$  (1.702 m)   Wt 69.4 kg   SpO2 99%   BMI 23.96 kg/m  Physical Exam Vitals and nursing note reviewed.  Constitutional:      Comments: Clothes dirty.   HENT:     Head: Normocephalic.     Comments: 2 in abrasion with dried blood noted about the right eye brow.    Mouth/Throat:     Mouth: Mucous membranes are moist.  Eyes:     General:        Right eye: No discharge.        Left eye: No discharge.     Conjunctiva/sclera: Conjunctivae normal.  Neck:     Comments: Midline cervical tenderness on palpation Cardiovascular:     Rate  and Rhythm: Normal rate and regular rhythm.     Pulses: Normal pulses.     Heart sounds: Normal heart sounds.  Pulmonary:     Effort: Pulmonary effort is normal.     Breath sounds: Normal breath sounds.  Abdominal:     General: Abdomen is flat.     Palpations: Abdomen is soft.  Musculoskeletal:     Comments: Bilateral shoulder tenderness on palpation  Skin:    General: Skin is warm and dry.  Neurological:     General: No focal deficit present.     Mental Status: He is oriented to person, place, and time.     Comments: obtunded  Psychiatric:        Mood and Affect: Mood normal.     ED Results / Procedures / Treatments   Labs (all labs ordered are listed, but only abnormal results are displayed) Labs Reviewed  CBC WITH DIFFERENTIAL/PLATELET - Abnormal; Notable for the following components:      Result Value   Hemoglobin 11.7 (*)     HCT 36.1 (*)    RDW 16.4 (*)    Platelets 107 (*)    All other components within normal limits  COMPREHENSIVE METABOLIC PANEL - Abnormal; Notable for the following components:   CO2 21 (*)    BUN <5 (*)    Calcium 8.4 (*)    AST 91 (*)    ALT 48 (*)    Alkaline Phosphatase 178 (*)    All other components within normal limits  ETHANOL - Abnormal; Notable for the following components:   Alcohol, Ethyl (B) 336 (*)    All other components within normal limits  CBG MONITORING, ED    EKG EKG Interpretation  Date/Time:  Wednesday November 18 2021 13:54:16 EDT Ventricular Rate:  85 PR Interval:  162 QRS Duration: 95 QT Interval:  375 QTC Calculation: 446 R Axis:   81 Text Interpretation: Sinus rhythm Confirmed by Lennice Sites ([id number]) on 11/18/2021 1:56:34 PM  Radiology CT Head Wo Contrast  Result Date: 11/18/2021 CLINICAL DATA:  Provided history: Head trauma, moderate/severe. Neck trauma, intoxicated or up tended. Additional history provided: Fall due to seizure (with head injury). Forehead laceration. EXAM: CT HEAD WITHOUT CONTRAST CT CERVICAL SPINE WITHOUT CONTRAST TECHNIQUE: Multidetector CT imaging of the head and cervical spine was performed following the standard protocol without intravenous contrast. Multiplanar CT image reconstructions of the cervical spine were also generated. RADIATION DOSE REDUCTION: This exam was performed according to the departmental dose-optimization program which includes automated exposure control, adjustment of the mA and/or kV according to patient size and/or use of iterative reconstruction technique. COMPARISON:  Prior head CT examinations 11/16/2021 and earlier. CT cervical spine 10/26/2021. FINDINGS: CT HEAD FINDINGS Brain: Moderate generalized cerebral atrophy.  Mild cerebellar atrophy. Mild patchy and ill-defined hypoattenuation within the cerebral white matter, nonspecific but compatible with chronic small vessel ischemic disease. Redemonstrated  chronic lacunar infarct within the right pons. There is no acute intracranial hemorrhage. No demarcated cortical infarct. No extra-axial fluid collection. No evidence of an intracranial mass. No midline shift. Vascular: No hyperdense vessel.  Atherosclerotic calcifications. Skull: No fracture or aggressive osseous lesion. Sinuses/Orbits: No mass or acute finding within the imaged orbits. Minimal mucosal thickening within the left maxillary, left sphenoid and bilateral ethmoid sinuses. Other: Right periorbital and anterior scalp hematoma. Redemonstrated comminuted and displaced fracture of the right zygomatic arch. CT CERVICAL SPINE FINDINGS Alignment: Dextrocurvature of the cervical spine. Straightening of the expected cervical lordosis. No  significant spondylolisthesis. Partially imaged levocurvature of the upper thoracic spine. Skull base and vertebrae: The basion-dental and atlanto-dental intervals are maintained.No evidence of acute fracture to the cervical spine. Soft tissues and spinal canal: No prevertebral fluid or swelling. No visible canal hematoma. Disc levels: Cervical spondylosis with multilevel disc space narrowing, disc bulges and uncovertebral hypertrophy. Multilevel spinal canal stenosis. Most notably, a disc bulge contributes to apparent at least moderate spinal canal stenosis at C5-C6. At least mild bilateral bony neural foraminal narrowing also present at this level. Upper chest: No consolidation within the imaged lung apices. No visible pneumothorax. Other: Redemonstrated chronic fracture deformities of the left clavicle and of multiple left-sided ribs. IMPRESSION: CT head: 1. No evidence of acute intracranial abnormality. 2. Right periorbital and anterior scalp hematoma. 3. Redemonstrated comminuted and displaced fracture of the right zygomatic arch. 4. Mild chronic small vessel changes within the cerebral white matter. 5. Redemonstrated chronic lacunar infarct within the right pons. 6. Age  advanced parenchymal atrophy. CT cervical spine: 1. No evidence of acute fracture to the cervical spine. 2. Dextrocurvature of the cervical spine. 3. Cervical spondylosis, as described and greatest at C5-C6. 4. Redemonstrated chronic fracture deformities of the left clavicle and of multiple left-sided ribs. Electronically Signed   By: Kellie Simmering D.O.   On: 11/18/2021 15:27   CT Cervical Spine Wo Contrast  Result Date: 11/18/2021 CLINICAL DATA:  Provided history: Head trauma, moderate/severe. Neck trauma, intoxicated or up tended. Additional history provided: Fall due to seizure (with head injury). Forehead laceration. EXAM: CT HEAD WITHOUT CONTRAST CT CERVICAL SPINE WITHOUT CONTRAST TECHNIQUE: Multidetector CT imaging of the head and cervical spine was performed following the standard protocol without intravenous contrast. Multiplanar CT image reconstructions of the cervical spine were also generated. RADIATION DOSE REDUCTION: This exam was performed according to the departmental dose-optimization program which includes automated exposure control, adjustment of the mA and/or kV according to patient size and/or use of iterative reconstruction technique. COMPARISON:  Prior head CT examinations 11/16/2021 and earlier. CT cervical spine 10/26/2021. FINDINGS: CT HEAD FINDINGS Brain: Moderate generalized cerebral atrophy.  Mild cerebellar atrophy. Mild patchy and ill-defined hypoattenuation within the cerebral white matter, nonspecific but compatible with chronic small vessel ischemic disease. Redemonstrated chronic lacunar infarct within the right pons. There is no acute intracranial hemorrhage. No demarcated cortical infarct. No extra-axial fluid collection. No evidence of an intracranial mass. No midline shift. Vascular: No hyperdense vessel.  Atherosclerotic calcifications. Skull: No fracture or aggressive osseous lesion. Sinuses/Orbits: No mass or acute finding within the imaged orbits. Minimal mucosal  thickening within the left maxillary, left sphenoid and bilateral ethmoid sinuses. Other: Right periorbital and anterior scalp hematoma. Redemonstrated comminuted and displaced fracture of the right zygomatic arch. CT CERVICAL SPINE FINDINGS Alignment: Dextrocurvature of the cervical spine. Straightening of the expected cervical lordosis. No significant spondylolisthesis. Partially imaged levocurvature of the upper thoracic spine. Skull base and vertebrae: The basion-dental and atlanto-dental intervals are maintained.No evidence of acute fracture to the cervical spine. Soft tissues and spinal canal: No prevertebral fluid or swelling. No visible canal hematoma. Disc levels: Cervical spondylosis with multilevel disc space narrowing, disc bulges and uncovertebral hypertrophy. Multilevel spinal canal stenosis. Most notably, a disc bulge contributes to apparent at least moderate spinal canal stenosis at C5-C6. At least mild bilateral bony neural foraminal narrowing also present at this level. Upper chest: No consolidation within the imaged lung apices. No visible pneumothorax. Other: Redemonstrated chronic fracture deformities of the left clavicle and of multiple left-sided  ribs. IMPRESSION: CT head: 1. No evidence of acute intracranial abnormality. 2. Right periorbital and anterior scalp hematoma. 3. Redemonstrated comminuted and displaced fracture of the right zygomatic arch. 4. Mild chronic small vessel changes within the cerebral white matter. 5. Redemonstrated chronic lacunar infarct within the right pons. 6. Age advanced parenchymal atrophy. CT cervical spine: 1. No evidence of acute fracture to the cervical spine. 2. Dextrocurvature of the cervical spine. 3. Cervical spondylosis, as described and greatest at C5-C6. 4. Redemonstrated chronic fracture deformities of the left clavicle and of multiple left-sided ribs. Electronically Signed   By: Kellie Simmering D.O.   On: 11/18/2021 15:27    Procedures Procedures     Medications Ordered in ED Medications - No data to display  ED Course/ Medical Decision Making/ A&P                           Medical Decision Making Amount and/or Complexity of Data Reviewed Labs: ordered. Radiology: ordered.  This patient presents to the ED for concern of seizure and head injury, this involves a number of treatment options, and is a complaint that carries with it a high risk of complications and morbidity.  The differential diagnosis includes ICH, skull fracture, alcohol intoxication, and seizure.   Co morbidities: Discussed in HPI      EMR reviewed including pt PMHx, past surgical history and past visits to ER.   See HPI for more details   Lab Tests:   I ordered and independently interpreted labs. Labs notable for elevated blood alcohol   Imaging Studies:  CT scans of head an neck are still pending    Cardiac Monitoring:  The patient was maintained on a cardiac monitor.  I personally viewed and interpreted the cardiac monitored which showed an underlying rhythm of: NSR EKG non-ischemic   Medicines ordered:  No medications ordered Reevaluation of the patient after these medicines showed that the patient stayed the same I have reviewed the patients home medicines and have made adjustments as needed   Consults/Attending Physician   I discussed this case with my attending physician who cosigned this note including patient's presenting symptoms, physical exam, and planned diagnostics and interventions. Attending physician stated agreement with plan or made changes to plan which were implemented.   Reevaluation:  After the interventions noted above I re-evaluated patient and found that they have :stayed the same   Social Determinants of Health:   Appears homeless    Problem List / ED Course:  Presented for seizure. Also endorsed head injury with loss of consciousness.  Patient appears obtunded likely related to alcohol  intoxication.  Given mechanism of injury and alcohol intoxication further evaluation was warranted.  CT of head head and neck ordered.  Blood alcohol was elevated.  Patient also reports that he does have a history of seizure but is not taking anything for it.  Signed out patient to oncoming APP who will continue to manage and follow upon pending imaging studies.    Dispostion:  After consideration of the diagnostic results and the patients response to treatment, I feel that the patent would benefit from ongoing management in the ED for seizure and head injury with LOC.          Final Clinical Impression(s) / ED Diagnoses Final diagnoses:  Head injury    Rx / DC Orders ED Discharge Orders     None         Quentin Cornwall,  Alfredo Batty, PA-C 11/18/21 [place], [place], DO 11/19/21 [phone]

## 2021-11-18 NOTE — ED Triage Notes (Addendum)
Pt arrived via [facility] with c/c of Seizure. [place]. Unknown amount of time of seizure, EMS called by PD. Hx of same, but refuses to take medicine. Abrasion from earlier fall this morning. Endorses drinks 3-4 40oz today.  138/78, CBG 100, 110 HR 500 NSS

## 2021-11-18 NOTE — Discharge Instructions (Addendum)
The CT of your head and neck do not show any acute injuries.  You have a healing fracture on the right cheek from earlier in the month.

## 2021-11-18 NOTE — ED Notes (Signed)
Pt. Ambulated to the charge station pt. was a shaking. Pt. Did not have a steady gate

## 2021-11-27 ENCOUNTER — Emergency Department (HOSPITAL_COMMUNITY)
Admission: EM | Admit: 2021-11-27 | Discharge: 2021-11-28 | Disposition: A | Discharge status: Home / Self Care | Payer: Medicaid - Out of State | Attending: Emergency Medicine | Admitting: Emergency Medicine

## 2021-11-27 ENCOUNTER — Encounter (HOSPITAL_COMMUNITY): Payer: Self-pay | Admitting: Emergency Medicine

## 2021-11-27 DIAGNOSIS — F1010 Alcohol abuse, uncomplicated: Secondary | ICD-10-CM | POA: Diagnosis not present

## 2021-11-27 DIAGNOSIS — R42 Dizziness and giddiness: Secondary | ICD-10-CM | POA: Diagnosis present

## 2021-11-27 NOTE — ED Triage Notes (Signed)
Pt reports that he has been dizzy for a couple of days. Pt endorses ETOH (a few 77s). No pain.

## 2021-11-28 NOTE — ED Provider Notes (Signed)
  [facility] DEPT Provider Note   CSN: [id number] Arrival date & time: 11/27/21  2055     History  Chief Complaint  Patient presents with   Alcohol Problem    Barry Moore is a 56 y.o. male.  The history is provided by the patient.  Patient reports he had a EMS check his blood pressure and it was elevated.  He reports he was told to come to the hospital.  Patient also reports feeling dizzy, but thinks it may be due to the heat outside.  He also endorses drinking alcohol     Home Medications Prior to Admission medications   Medication Sig Start Date End Date Taking? Authorizing Provider  citalopram (CELEXA) 10 MG tablet Take 1 tablet (10 mg total) by mouth daily. 10/29/21   Vesta Mixer, NP  ferrous sulfate 325 (65 FE) MG tablet Take 1 tablet (325 mg total) by mouth daily with breakfast. Patient not taking: Reported on 09/23/2018 08/09/18 10/09/18  Elodia Florence., MD  pantoprazole (PROTONIX) 40 MG tablet Take 1 tablet (40 mg total) by mouth 2 (two) times daily before a meal. Patient not taking: Reported on 08/14/2020 06/27/20 08/14/20  Charlynne Cousins, MD      Allergies    Tomato, Aspirin, and Sulfa antibiotics    Review of Systems   Review of Systems  Physical Exam Updated Vital Signs BP 132/84   Pulse 92   Temp 98.4 F (36.9 C) (Oral)   Resp 16   SpO2 97%  Physical Exam CONSTITUTIONAL: Disheveled, no acute distress, patient asleep when I initially approached the bedside HEAD: Normocephalic/atraumatic EYES: EOMI/PERRL ENMT: Mucous membranes moist NECK: supple no meningeal signs LUNGS: no apparent distress NEURO: Patient sleeping but easily arousable.  He moves all extremities x4 with no gross weakness. Patient ambulated slowly but without ataxia EXTREMITIES:  full ROM SKIN: warm, color normal  ED Results / Procedures / Treatments   Labs (all labs ordered are listed, but only abnormal results are displayed) Labs  Reviewed - No data to display  EKG None  Radiology No results found.  Procedures Procedures    Medications Ordered in ED Medications - No data to display  ED Course/ Medical Decision Making/ A&P                           Medical Decision Making  Patient presents for 20th ER visit in 6 months.  Patient has a history of alcohol use disorder.  Patient been monitored for several hours without any deterioration. Patient will be discharged        Final Clinical Impression(s) / ED Diagnoses Final diagnoses:  Alcohol abuse    Rx / DC Orders ED Discharge Orders     None         Ripley Fraise, MD 11/28/21 [phone]

## 2021-12-06 ENCOUNTER — Emergency Department (HOSPITAL_COMMUNITY): Payer: Medicaid - Out of State

## 2021-12-06 ENCOUNTER — Encounter (HOSPITAL_COMMUNITY): Payer: Self-pay | Admitting: *Deleted

## 2021-12-06 ENCOUNTER — Other Ambulatory Visit: Payer: Self-pay

## 2021-12-06 ENCOUNTER — Emergency Department (HOSPITAL_COMMUNITY)
Admission: EM | Admit: 2021-12-06 | Discharge: 2021-12-07 | Disposition: A | Discharge status: Home / Self Care | Payer: Medicaid - Out of State | Attending: Emergency Medicine | Admitting: Emergency Medicine

## 2021-12-06 DIAGNOSIS — F10220 Alcohol dependence with intoxication, uncomplicated: Secondary | ICD-10-CM | POA: Insufficient documentation

## 2021-12-06 DIAGNOSIS — Y908 Blood alcohol level of 240 mg/100 ml or more: Secondary | ICD-10-CM | POA: Insufficient documentation

## 2021-12-06 DIAGNOSIS — F10920 Alcohol use, unspecified with intoxication, uncomplicated: Secondary | ICD-10-CM

## 2021-12-06 DIAGNOSIS — M6281 Muscle weakness (generalized): Secondary | ICD-10-CM | POA: Insufficient documentation

## 2021-12-06 DIAGNOSIS — R5383 Other fatigue: Secondary | ICD-10-CM | POA: Insufficient documentation

## 2021-12-06 DIAGNOSIS — M542 Cervicalgia: Secondary | ICD-10-CM | POA: Insufficient documentation

## 2021-12-06 DIAGNOSIS — R519 Headache, unspecified: Secondary | ICD-10-CM | POA: Insufficient documentation

## 2021-12-06 DIAGNOSIS — I10 Essential (primary) hypertension: Secondary | ICD-10-CM | POA: Insufficient documentation

## 2021-12-06 LAB — COMPREHENSIVE METABOLIC PANEL
ALT: 66 U/L — ABNORMAL HIGH (ref 0–44)
AST: 149 U/L — ABNORMAL HIGH (ref 15–41)
Albumin: 4.1 g/dL (ref 3.5–5.0)
Alkaline Phosphatase: 147 U/L — ABNORMAL HIGH (ref 38–126)
Anion gap: 9 (ref 5–15)
BUN: <5 mg/dL — ABNORMAL LOW (ref 6–20)
CO2: 26 mmol/L (ref 22–32)
Calcium: 9 mg/dL (ref 8.9–10.3)
Chloride: 103 mmol/L (ref 98–111)
Creatinine, Ser: 0.77 mg/dL (ref 0.61–1.24)
GFR, Estimated: >60 mL/min (ref >60)
Glucose, Bld: 94 mg/dL (ref 70–99)
Potassium: 3.6 mmol/L (ref 3.5–5.1)
Sodium: 138 mmol/L (ref 135–145)
Total Bilirubin: 0.6 mg/dL (ref 0.3–1.2)
Total Protein: 8.2 g/dL — ABNORMAL HIGH (ref 6.5–8.1)

## 2021-12-06 LAB — ETHANOL: Alcohol, Ethyl (B): 380 mg/dL (ref <10)

## 2021-12-06 LAB — CBC
HCT: 37.9 % — ABNORMAL LOW (ref 39.0–52.0)
Hemoglobin: 12.1 g/dL — ABNORMAL LOW (ref 13.0–17.0)
MCH: 27.4 pg (ref 26.0–34.0)
MCHC: 31.9 g/dL (ref 30.0–36.0)
MCV: 85.9 fL (ref 80.0–100.0)
Platelets: 114 10*3/uL — ABNORMAL LOW (ref 150–400)
RBC: 4.41 MIL/uL (ref 4.22–5.81)
RDW: 17.2 % — ABNORMAL HIGH (ref 11.5–15.5)
WBC: 4.8 10*3/uL (ref 4.0–10.5)
nRBC: 0 % (ref 0.0–0.2)

## 2021-12-06 LAB — MAGNESIUM: Magnesium: 2.2 mg/dL (ref 1.7–2.4)

## 2021-12-06 MED ORDER — THIAMINE HCL 100 MG/ML IJ SOLN
100.0000 mg | Freq: Once | INTRAMUSCULAR | Status: AC
  Administered 2021-12-06: 100 mg via INTRAVENOUS
  Filled 2021-12-06: qty 2

## 2021-12-06 MED ORDER — LACTATED RINGERS IV BOLUS
2000.0000 mL | Freq: Once | INTRAVENOUS | Status: AC
  Administered 2021-12-06: 2000 mL via INTRAVENOUS

## 2021-12-06 NOTE — ED Notes (Signed)
Pt tolerating PO intake. Pt states he is to dizzy to walk but able to stand at side of bed.

## 2021-12-06 NOTE — ED Provider Notes (Addendum)
[facility] DEPT Provider Note   CSN: [id number] Arrival date & time: 12/06/21  1848     History  Chief Complaint  Patient presents with   Alcohol Intoxication    Barry Moore is a 56 y.o. male.   Alcohol Intoxication Associated symptoms include headaches.  Patient presents for loss of consciousness.  Medical history includes HTN, alcohol abuse, homelessness, seizures, depression, polysubstance abuse, pancreatitis, upper GI bleed.  He has had many frequent visits to the ED for alcohol abuse.  Patient reports that he typically drinks 4 to 540 ounce beers per day.  Today he drank 3, starting this morning.  He states that, around 6 PM, he had a loss of consciousness.  He is unsure if this was from seizure or syncope.  He does report that he was outside throughout the day and drinks very little fluids other than alcohol.  He states that the fire department was called to the scene as he was laying on the ground because "somebody must of thought I was dead".  Per triage note, patient was talking on the phone when first responders arrived.  Currently, he endorses headache, neck pain, fatigue, and generalized weakness.     Home Medications Prior to Admission medications   Medication Sig Start Date End Date Taking? Authorizing Provider  citalopram (CELEXA) 10 MG tablet Take 1 tablet (10 mg total) by mouth daily. 10/29/21   Vesta Mixer, NP  ferrous sulfate 325 (65 FE) MG tablet Take 1 tablet (325 mg total) by mouth daily with breakfast. Patient not taking: Reported on 09/23/2018 08/09/18 10/09/18  Elodia Florence., MD  pantoprazole (PROTONIX) 40 MG tablet Take 1 tablet (40 mg total) by mouth 2 (two) times daily before a meal. Patient not taking: Reported on 08/14/2020 06/27/20 08/14/20  Charlynne Cousins, MD      Allergies    Tomato, Aspirin, and Sulfa antibiotics    Review of Systems   Review of Systems  Constitutional:  Positive for fatigue.   Musculoskeletal:  Positive for neck pain.  Neurological:  Positive for weakness (Generalized) and headaches.       LOC  All other systems reviewed and are negative.   Physical Exam Updated Vital Signs BP (!) 158/80   Pulse 91   Temp 98.8 F (37.1 C) (Oral)   Resp (!) 21   Ht '5\' 7"'$  (1.702 m)   Wt 69.7 kg   SpO2 98%   BMI 24.07 kg/m  Physical Exam Vitals and nursing note reviewed.  Constitutional:      General: He is not in acute distress.    Appearance: Normal appearance. He is well-developed. He is ill-appearing (Chronically). He is not toxic-appearing or diaphoretic.  HENT:     Head: Normocephalic and atraumatic.     Right Ear: External ear normal.     Left Ear: External ear normal.     Nose: Nose normal.     Mouth/Throat:     Mouth: Mucous membranes are moist.     Pharynx: Oropharynx is clear.     Comments: No tongue bite Eyes:     Extraocular Movements: Extraocular movements intact.     Conjunctiva/sclera: Conjunctivae normal.  Cardiovascular:     Rate and Rhythm: Normal rate and regular rhythm.     Heart sounds: No murmur heard. Pulmonary:     Effort: Pulmonary effort is normal. No respiratory distress.     Breath sounds: Normal breath sounds. No wheezing or rales.  Chest:  Chest wall: No tenderness.  Abdominal:     General: There is no distension.     Palpations: Abdomen is soft.     Tenderness: There is abdominal tenderness (Generalized).  Musculoskeletal:        General: Tenderness (Left clavicle and left trapezius) present. No swelling or deformity. Normal range of motion.     Cervical back: Normal range of motion and neck supple.     Right lower leg: No edema.     Left lower leg: No edema.  Skin:    General: Skin is warm and dry.     Capillary Refill: Capillary refill takes less than 2 seconds.     Coloration: Skin is not jaundiced or pale.  Neurological:     General: No focal deficit present.     Mental Status: He is alert and oriented to  person, place, and time.     Cranial Nerves: No cranial nerve deficit.     Sensory: No sensory deficit.     Motor: No weakness.     Coordination: Coordination normal.  Psychiatric:        Mood and Affect: Mood normal.        Behavior: Behavior normal.        Thought Content: Thought content normal.        Judgment: Judgment normal.     ED Results / Procedures / Treatments   Labs (all labs ordered are listed, but only abnormal results are displayed) Labs Reviewed  COMPREHENSIVE METABOLIC PANEL - Abnormal; Notable for the following components:      Result Value   BUN <5 (*)    Total Protein 8.2 (*)    AST 149 (*)    ALT 66 (*)    Alkaline Phosphatase 147 (*)    All other components within normal limits  ETHANOL - Abnormal; Notable for the following components:   Alcohol, Ethyl (B) 380 (*)    All other components within normal limits  CBC - Abnormal; Notable for the following components:   Hemoglobin 12.1 (*)    HCT 37.9 (*)    RDW 17.2 (*)    Platelets 114 (*)    All other components within normal limits  MAGNESIUM    EKG None  Radiology DG Clavicle Left  Result Date: 12/06/2021 CLINICAL DATA:  Recent seizure activity with left shoulder pain, initial encounter EXAM: LEFT CLAVICLE - 2+ VIEWS COMPARISON:  10/23/2021, 11/16/2021 FINDINGS: Chronic proximal and distal left clavicular fractures are again identified. Some callus formation is seen. No dislocation is noted. No acute fracture is noted. IMPRESSION: Chronic fractures without acute abnormality. Electronically Signed   By: Inez Catalina M.D.   On: 12/06/2021 21:59   CT HEAD WO CONTRAST  Result Date: 12/06/2021 CLINICAL DATA:  Recent seizure with headaches and neck pain, initial encounter EXAM: CT HEAD WITHOUT CONTRAST CT CERVICAL SPINE WITHOUT CONTRAST TECHNIQUE: Multidetector CT imaging of the head and cervical spine was performed following the standard protocol without intravenous contrast. Multiplanar CT image  reconstructions of the cervical spine were also generated. RADIATION DOSE REDUCTION: This exam was performed according to the departmental dose-optimization program which includes automated exposure control, adjustment of the mA and/or kV according to patient size and/or use of iterative reconstruction technique. COMPARISON:  11/18/2021 FINDINGS: CT HEAD FINDINGS Brain: No evidence of acute infarction, hemorrhage, hydrocephalus, extra-axial collection or mass lesion/mass effect. Mild atrophic and chronic white matter ischemic changes are again noted. Vascular: No hyperdense vessel or unexpected calcification. Skull:  Normal. Negative for fracture or focal lesion. Sinuses/Orbits: No acute finding. Other: None. CT CERVICAL SPINE FINDINGS Alignment: Straightening of the normal cervical lordosis is noted which may be related to muscular spasm. Skull base and vertebrae: 7 cervical segments are well visualized. Vertebral body height is well maintained. Osteophytic changes are noted at C4-5 and C5-6. Mild facet hypertrophic changes are seen. No acute fracture or acute facet abnormality is noted. Soft tissues and spinal canal: Surrounding soft tissue structures are within normal limits. Upper chest: Prior left clavicular fracture is seen with some healing. Other: None IMPRESSION: CT of the head: No acute intracranial abnormality noted. Chronic atrophic and ischemic changes. CT of the cervical spine: Mild degenerative change without acute abnormality. Electronically Signed   By: Inez Catalina M.D.   On: 12/06/2021 21:57   CT CERVICAL SPINE WO CONTRAST  Result Date: 12/06/2021 CLINICAL DATA:  Recent seizure with headaches and neck pain, initial encounter EXAM: CT HEAD WITHOUT CONTRAST CT CERVICAL SPINE WITHOUT CONTRAST TECHNIQUE: Multidetector CT imaging of the head and cervical spine was performed following the standard protocol without intravenous contrast. Multiplanar CT image reconstructions of the cervical spine were  also generated. RADIATION DOSE REDUCTION: This exam was performed according to the departmental dose-optimization program which includes automated exposure control, adjustment of the mA and/or kV according to patient size and/or use of iterative reconstruction technique. COMPARISON:  11/18/2021 FINDINGS: CT HEAD FINDINGS Brain: No evidence of acute infarction, hemorrhage, hydrocephalus, extra-axial collection or mass lesion/mass effect. Mild atrophic and chronic white matter ischemic changes are again noted. Vascular: No hyperdense vessel or unexpected calcification. Skull: Normal. Negative for fracture or focal lesion. Sinuses/Orbits: No acute finding. Other: None. CT CERVICAL SPINE FINDINGS Alignment: Straightening of the normal cervical lordosis is noted which may be related to muscular spasm. Skull base and vertebrae: 7 cervical segments are well visualized. Vertebral body height is well maintained. Osteophytic changes are noted at C4-5 and C5-6. Mild facet hypertrophic changes are seen. No acute fracture or acute facet abnormality is noted. Soft tissues and spinal canal: Surrounding soft tissue structures are within normal limits. Upper chest: Prior left clavicular fracture is seen with some healing. Other: None IMPRESSION: CT of the head: No acute intracranial abnormality noted. Chronic atrophic and ischemic changes. CT of the cervical spine: Mild degenerative change without acute abnormality. Electronically Signed   By: Inez Catalina M.D.   On: 12/06/2021 21:57   DG Chest Port 1 View  Result Date: 12/06/2021 CLINICAL DATA:  Trauma, states he had a seizure.  Hypertension. EXAM: PORTABLE CHEST 1 VIEW COMPARISON:  Chest radiograph dated November 16, 2021 FINDINGS: The heart size and mediastinal contours are within normal limits. Both lungs are clear. The visualized skeletal structures are unremarkable. IMPRESSION: No active disease. Electronically Signed   By: Keane Police D.O.   On: 12/06/2021 21:54     Procedures Procedures    Medications Ordered in ED Medications  lactated ringers bolus 2,000 mL (2,000 mLs Intravenous New Bag/Given 12/06/21 2126)  thiamine (VITAMIN B1) injection 100 mg (100 mg Intravenous Given 12/06/21 2126)    ED Course/ Medical Decision Making/ A&P                           Medical Decision Making Amount and/or Complexity of Data Reviewed Labs: ordered. Radiology: ordered.  Risk Prescription drug management.   This patient presents to the ED for concern of LOC, this involves an extensive number of treatment  options, and is a complaint that carries with it a high risk of complications and morbidity.  The differential diagnosis includes seizure, syncope, dehydration, intoxication, arrhythmia, anemia   Co morbidities that complicate the patient evaluation  HTN, alcohol abuse, homelessness, seizures, depression, polysubstance abuse, pancreatitis, upper GI bleed   Additional history obtained:  Additional history obtained from N/A External records from outside source obtained and reviewed including EMR   Lab Tests:  I Ordered, and personally interpreted labs.  The pertinent results include: Initial lab work shows baseline anemia, no leukocytosis, normal electrolytes, baseline elevations in transaminases, and an ethanol level of 380.   Imaging Studies ordered:  I ordered imaging studies including x-ray imaging of chest and left clavicle, CT imaging of head and cervical spine I independently visualized and interpreted imaging which showed no acute findings.  Chronic fractures are noted to left clavicle. I agree with the radiologist interpretation   Cardiac Monitoring: / EKG:  The patient was maintained on a cardiac monitor.  I personally viewed and interpreted the cardiac monitored which showed an underlying rhythm of: Sinus rhythm  Problem List / ED Course / Critical interventions / Medication management  Patient is a 56 year old male with  history of alcohol abuse and frequent ED visits, presenting for an unspecified loss of consciousness prior to arrival.  He states that he drank a lesser amount of alcohol today than he typically does.  He was outside throughout the day and does feel that he is dehydrated.  He feels that he may have had a seizure.  He does believe that he fell during this episode.  He endorses some pain in the left trapezius region as well as headache.  He is alert and oriented on arrival in the ED.  He has no focal neurologic deficits.  Prior to being bedded in the ED, basic lab work was obtained.  Results were notable only for a alcohol level of 380.  This is consistent with his alcohol levels during prior ED visits.  Patient was given IV fluids, and thiamine.  CT imaging of head and cervical spine were ordered in addition to x-ray imaging of chest and left clavicle.  Results of imaging showed no acute injuries.  Given the patient's elevated alcohol level, I suspect that he was simply intoxicated prior to arrival.  Elevated ethanol level also lower suspicion of seizure.  EMS reported no postictal symptoms on scene.  Patient was able to tolerate p.o. intake in the ED.  When attempting to ambulate the ED, he endorsed dizziness.  Patient will require reassessment.  Care of patient was signed out to oncoming ED provider. I ordered medication including IV fluids for dehydration; thiamine for chronic alcoholism Reevaluation of the patient after these medicines showed that the patient improved I have reviewed the patients home medicines and have made adjustments as needed   Social Determinants of Health:  Alcoholism, frequent ED visits         Final Clinical Impression(s) / ED Diagnoses Final diagnoses:  Alcoholic intoxication without complication [facility])    Rx / [place] Orders ED Discharge Orders     None         Godfrey Pick, MD 12/06/21 2309    Godfrey Pick, MD 12/06/21 [phone]

## 2021-12-06 NOTE — ED Provider Triage Note (Signed)
Emergency Medicine Provider Triage Evaluation Note  Barry Moore , Moore 56 y.o. male  was evaluated in triage.  Pt complains of ETOH intoxication onset PTA. Pt typically consumes 4-5 40 oz daily and has had three 40 oz today. Has nausea. No vomiting. History of DTs.   Review of Systems  Positive:  Negative:   Physical Exam  BP (!) 148/81 (BP Location: Right Arm)   Pulse 82   Temp 98.8 F (37.1 C) (Oral)   Resp 17   Ht '5\' 7"'$  (1.702 m)   Wt 69.7 kg   SpO2 96%   BMI 24.07 kg/m  Gen:   Awake, no distress   Resp:  Normal effort  MSK:   Moves extremities without difficulty  Other:  No tongue fasciculations noted on exam  Medical Decision Making  Medically screening exam initiated at 7:07 PM.  Appropriate orders placed.  Barry Moore was informed that the remainder of the evaluation will be completed by another provider, this initial triage assessment does not replace that evaluation, and the importance of remaining in the ED until their evaluation is complete.  Work-up initiated   Barry Bennett A, PA-C 12/06/21 1908

## 2021-12-06 NOTE — ED Notes (Signed)
Pt states he dose not feel like he can get up. Pt unwilling to try at this time

## 2021-12-06 NOTE — ED Notes (Signed)
Pt resting in stretcher. Pt denies nausea and headache. Pt calm and cooperative with nursing care.

## 2021-12-06 NOTE — ED Triage Notes (Addendum)
BIB EMS ETOH 3 - 40oz Beer today, states he had a seizure, talking on phone when EMS arrived.  No evidence of being post ictal.[id number]% Cbg 125

## 2021-12-07 NOTE — ED Provider Notes (Signed)
2:44 AM Patient now able to ambulate after IV fluid bolus.     Tnya Ades, Jenny Reichmann, MD 12/07/21 [phone]

## 2021-12-07 NOTE — ED Notes (Signed)
Pt able to ambulate to door of room and back without assistance.

## 2021-12-08 ENCOUNTER — Encounter (HOSPITAL_COMMUNITY): Payer: Self-pay

## 2021-12-08 ENCOUNTER — Other Ambulatory Visit: Payer: Self-pay

## 2021-12-08 ENCOUNTER — Emergency Department (HOSPITAL_COMMUNITY)
Admission: EM | Admit: 2021-12-08 | Discharge: 2021-12-09 | Disposition: A | Discharge status: Home / Self Care | Payer: Medicaid - Out of State | Attending: Emergency Medicine | Admitting: Emergency Medicine

## 2021-12-08 DIAGNOSIS — R42 Dizziness and giddiness: Secondary | ICD-10-CM

## 2021-12-08 DIAGNOSIS — F29 Unspecified psychosis not due to a substance or known physiological condition: Secondary | ICD-10-CM | POA: Insufficient documentation

## 2021-12-08 DIAGNOSIS — F10929 Alcohol use, unspecified with intoxication, unspecified: Secondary | ICD-10-CM

## 2021-12-08 DIAGNOSIS — Y908 Blood alcohol level of 240 mg/100 ml or more: Secondary | ICD-10-CM | POA: Insufficient documentation

## 2021-12-08 DIAGNOSIS — R45851 Suicidal ideations: Secondary | ICD-10-CM | POA: Insufficient documentation

## 2021-12-08 DIAGNOSIS — F1024 Alcohol dependence with alcohol-induced mood disorder: Secondary | ICD-10-CM | POA: Diagnosis present

## 2021-12-08 DIAGNOSIS — F1094 Alcohol use, unspecified with alcohol-induced mood disorder: Secondary | ICD-10-CM

## 2021-12-08 DIAGNOSIS — F10120 Alcohol abuse with intoxication, uncomplicated: Secondary | ICD-10-CM | POA: Insufficient documentation

## 2021-12-08 DIAGNOSIS — Z5900 Homelessness unspecified: Secondary | ICD-10-CM

## 2021-12-08 DIAGNOSIS — F10129 Alcohol abuse with intoxication, unspecified: Secondary | ICD-10-CM

## 2021-12-08 LAB — COMPREHENSIVE METABOLIC PANEL
ALT: 75 U/L — ABNORMAL HIGH (ref 0–44)
AST: 176 U/L — ABNORMAL HIGH (ref 15–41)
Albumin: 4.2 g/dL (ref 3.5–5.0)
Alkaline Phosphatase: 152 U/L — ABNORMAL HIGH (ref 38–126)
Anion gap: 13 (ref 5–15)
BUN: 5 mg/dL — ABNORMAL LOW (ref 6–20)
CO2: 24 mmol/L (ref 22–32)
Calcium: 8.7 mg/dL — ABNORMAL LOW (ref 8.9–10.3)
Chloride: 103 mmol/L (ref 98–111)
Creatinine, Ser: 0.66 mg/dL (ref 0.61–1.24)
GFR, Estimated: >60 mL/min (ref >60)
Glucose, Bld: 93 mg/dL (ref 70–99)
Potassium: 3.8 mmol/L (ref 3.5–5.1)
Sodium: 140 mmol/L (ref 135–145)
Total Bilirubin: 0.7 mg/dL (ref 0.3–1.2)
Total Protein: 8 g/dL (ref 6.5–8.1)

## 2021-12-08 LAB — RAPID URINE DRUG SCREEN, HOSP PERFORMED
Amphetamines: NOT DETECTED
Barbiturates: NOT DETECTED
Benzodiazepines: NOT DETECTED
Cocaine: NOT DETECTED
Opiates: NOT DETECTED
Tetrahydrocannabinol: NOT DETECTED

## 2021-12-08 LAB — CBC
HCT: 36.1 % — ABNORMAL LOW (ref 39.0–52.0)
Hemoglobin: 11.6 g/dL — ABNORMAL LOW (ref 13.0–17.0)
MCH: 27.2 pg (ref 26.0–34.0)
MCHC: 32.1 g/dL (ref 30.0–36.0)
MCV: 84.7 fL (ref 80.0–100.0)
Platelets: 120 10*3/uL — ABNORMAL LOW (ref 150–400)
RBC: 4.26 MIL/uL (ref 4.22–5.81)
RDW: 17.2 % — ABNORMAL HIGH (ref 11.5–15.5)
WBC: 4 10*3/uL (ref 4.0–10.5)
nRBC: 0 % (ref 0.0–0.2)

## 2021-12-08 LAB — ETHANOL: Alcohol, Ethyl (B): 372 mg/dL (ref <10)

## 2021-12-08 LAB — ACETAMINOPHEN LEVEL: Acetaminophen (Tylenol), Serum: <10 ug/mL — ABNORMAL LOW (ref 10–30)

## 2021-12-08 LAB — SALICYLATE LEVEL: Salicylate Lvl: <7 mg/dL — ABNORMAL LOW (ref 7.0–30.0)

## 2021-12-08 MED ORDER — LORAZEPAM 2 MG/ML IJ SOLN: 0.0000 mg | Freq: Four times a day (QID) | INTRAMUSCULAR | Status: DC

## 2021-12-08 MED ORDER — SODIUM CHLORIDE 0.9 % IV BOLUS
1000.0000 mL | Freq: Once | INTRAVENOUS | Status: AC
  Administered 2021-12-08: 1000 mL via INTRAVENOUS

## 2021-12-08 MED ORDER — LORAZEPAM 1 MG PO TABS: 0.0000 mg | ORAL_TABLET | Freq: Two times a day (BID) | ORAL | Status: DC

## 2021-12-08 MED ORDER — LORAZEPAM 2 MG/ML IJ SOLN: 0.0000 mg | Freq: Two times a day (BID) | INTRAMUSCULAR | Status: DC

## 2021-12-08 MED ORDER — LORAZEPAM 1 MG PO TABS
0.0000 mg | ORAL_TABLET | Freq: Four times a day (QID) | ORAL | Status: DC
  Administered 2021-12-09: 1 mg via ORAL
  Filled 2021-12-08: qty 1

## 2021-12-08 MED ORDER — THIAMINE HCL 100 MG/ML IJ SOLN: 100.0000 mg | Freq: Every day | INTRAMUSCULAR | Status: DC

## 2021-12-08 MED ORDER — ONDANSETRON HCL 4 MG PO TABS: 4.0000 mg | ORAL_TABLET | Freq: Three times a day (TID) | ORAL | Status: DC | PRN

## 2021-12-08 MED ORDER — ACETAMINOPHEN 325 MG PO TABS: 650.0000 mg | ORAL_TABLET | ORAL | Status: DC | PRN

## 2021-12-08 MED ORDER — CITALOPRAM HYDROBROMIDE 10 MG PO TABS
10.0000 mg | ORAL_TABLET | Freq: Every day | ORAL | Status: DC
  Administered 2021-12-09: 10 mg via ORAL
  Filled 2021-12-08: qty 1

## 2021-12-08 MED ORDER — THIAMINE MONONITRATE 100 MG PO TABS
100.0000 mg | ORAL_TABLET | Freq: Every day | ORAL | Status: DC
  Administered 2021-12-09: 100 mg via ORAL
  Filled 2021-12-08: qty 1

## 2021-12-08 NOTE — ED Triage Notes (Signed)
Per EMS, pt c/o dizziness and not eating for 2 days and drinking alcohol today. EMS reports pt is suicidal with a plan to cut himself with a knife. Pt presents to ED with pocket knife, taken by security.

## 2021-12-08 NOTE — ED Provider Notes (Signed)
[facility] DEPT Provider Note   CSN: [id number] Arrival date & time: 12/08/21  2051     History PMH: HTN, ETOH use disorder, Depression, Pancreatitis, Alcoholic Hepatitis, Seizures Chief Complaint  Patient presents with   Suicidal    Barry Moore is a 56 y.o. male.  Patient presents the ED with complaints of dizziness.  He says that he has noticed this after the past couple of days when he stands up.  He states that when he sits back down his symptoms resolved.  He has not been drinking much water recently.  He has been drinking a lot of alcohol claims that he drinks 3-4 drinks today.  Is also complaining of suicidal ideations and plans to cut his wrist with a knife.  He states that he has had frequent suicidal ideations recently, but states that they are worse tonight.  He states that security took his knife when he came to the emergency pain room.  HPI     Home Medications Prior to Admission medications   Medication Sig Start Date End Date Taking? Authorizing Provider  citalopram (CELEXA) 10 MG tablet Take 1 tablet (10 mg total) by mouth daily. 10/29/21   Vesta Mixer, NP  ferrous sulfate 325 (65 FE) MG tablet Take 1 tablet (325 mg total) by mouth daily with breakfast. Patient not taking: Reported on 09/23/2018 08/09/18 10/09/18  Elodia Florence., MD  pantoprazole (PROTONIX) 40 MG tablet Take 1 tablet (40 mg total) by mouth 2 (two) times daily before a meal. Patient not taking: Reported on 08/14/2020 06/27/20 08/14/20  Charlynne Cousins, MD      Allergies    Tomato, Aspirin, and Sulfa antibiotics    Review of Systems   Review of Systems  Constitutional:  Positive for fatigue.  Neurological:  Positive for dizziness.  Psychiatric/Behavioral:  Positive for sleep disturbance.   All other systems reviewed and are negative.   Physical Exam Updated Vital Signs BP 128/76   Pulse 87   Temp 98.5 F (36.9 C) (Oral)   Resp 17   Ht 5'  8" (1.727 m)   Wt 68 kg   SpO2 93%   BMI 22.81 kg/m  Physical Exam Vitals and nursing note reviewed.  Constitutional:      General: He is not in acute distress.    Appearance: Normal appearance. He is well-developed. He is not ill-appearing, toxic-appearing or diaphoretic.     Comments: Seems intoxicated  HENT:     Head: Normocephalic and atraumatic.     Nose: No nasal deformity.     Mouth/Throat:     Lips: Pink. No lesions.     Mouth: Mucous membranes are moist.     Pharynx: Oropharynx is clear. No oropharyngeal exudate or posterior oropharyngeal erythema.  Eyes:     General: Gaze aligned appropriately. No scleral icterus.       Right eye: No discharge.        Left eye: No discharge.     Conjunctiva/sclera: Conjunctivae normal.     Right eye: Right conjunctiva is not injected. No exudate or hemorrhage.    Left eye: Left conjunctiva is not injected. No exudate or hemorrhage. Pulmonary:     Effort: Pulmonary effort is normal. No respiratory distress.  Abdominal:     General: Abdomen is flat.     Palpations: Abdomen is soft.     Tenderness: There is no abdominal tenderness. There is no guarding or rebound.  Skin:  General: Skin is warm and dry.  Neurological:     Mental Status: He is alert and oriented to person, place, and time.  Psychiatric:        Mood and Affect: Mood normal.        Speech: Speech normal.        Behavior: Behavior normal. Behavior is cooperative.     ED Results / Procedures / Treatments   Labs (all labs ordered are listed, but only abnormal results are displayed) Labs Reviewed  COMPREHENSIVE METABOLIC PANEL - Abnormal; Notable for the following components:      Result Value   BUN 5 (*)    Calcium 8.7 (*)    AST 176 (*)    ALT 75 (*)    Alkaline Phosphatase 152 (*)    All other components within normal limits  ETHANOL - Abnormal; Notable for the following components:   Alcohol, Ethyl (B) 372 (*)    All other components within normal limits   SALICYLATE LEVEL - Abnormal; Notable for the following components:   Salicylate Lvl <6.4 (*)    All other components within normal limits  ACETAMINOPHEN LEVEL - Abnormal; Notable for the following components:   Acetaminophen (Tylenol), Serum <10 (*)    All other components within normal limits  CBC - Abnormal; Notable for the following components:   Hemoglobin 11.6 (*)    HCT 36.1 (*)    RDW 17.2 (*)    Platelets 120 (*)    All other components within normal limits  RESP PANEL BY RT-PCR (FLU A&B, COVID) ARPGX2  RAPID URINE DRUG SCREEN, HOSP PERFORMED    EKG None  Radiology No results found.  Procedures Procedures   Medications Ordered in ED Medications  LORazepam (ATIVAN) injection 0-4 mg (has no administration in time range)    Or  LORazepam (ATIVAN) tablet 0-4 mg (has no administration in time range)  LORazepam (ATIVAN) injection 0-4 mg (has no administration in time range)    Or  LORazepam (ATIVAN) tablet 0-4 mg (has no administration in time range)  thiamine (VITAMIN B1) tablet 100 mg (has no administration in time range)    Or  thiamine (VITAMIN B1) injection 100 mg (has no administration in time range)  acetaminophen (TYLENOL) tablet 650 mg (has no administration in time range)  ondansetron (ZOFRAN) tablet 4 mg (has no administration in time range)  citalopram (CELEXA) tablet 10 mg (has no administration in time range)  sodium chloride 0.9 % bolus 1,000 mL (1,000 mLs Intravenous New Bag/Given 12/08/21 2316)    ED Course/ Medical Decision Making/ A&P                           Medical Decision Making Amount and/or Complexity of Data Reviewed Labs: ordered.  Risk OTC drugs. Prescription drug management.    MDM  This is a 56 y.o. male who presents to the ED with dizziness and SI The differential of this patient includes but is not limited to orthostatic hypotension, arrhythmia, hypoglycemia, electrolyte abnormality, dehydration, intoxication  Initial  Impression: Vitals stable Clinically intoxicated Not in withdrawal. CIWA 0 Clearance labs placed. IVF and EKG for dizziness symptoms. Right now he is too intoxicated to contract to safety and states that he is actively suicidal with a plan. He will need to metabolize further and reassess.  I personally ordered, reviewed, and interpreted all laboratory work and imaging and agree with radiologist interpretation. Results interpreted below: CBC stable findings,  CMP with stable LFT abnormalities, renal function okay, electrolytes okay, glucose 93. Tox screen notable for ETOH level of 372. EKG stable from prior.   Assessment/Plan:  Dizziness likely 2/2 to dehydration and orthostatic. He was given 1 L IVF. He does not have any notable laboratory abnormalities to suggest severe dehydration, alcoholic acidosis, electrolyte abnormalities. He is medically clear from this stand point.   Patient is clinically intoxicated and continues to state he is going to slit his wrists if he is discharged. He is unable to contract to safety and will need to metabolize further with reevaluation of suicidal thoughts.   Charting Requirements Additional history is obtained from:  Independent historian External Records from outside source obtained and reviewed including: Prior psychiatric notes Social Determinants of Health:  Alcoholism/Drug Addiction Pertinant PMH that complicates patient's illness: ETOH use disorder, Depression  Patient Care Problems that were addressed during this visit: - SI: Acute illness with threat to life or bodily functions - Dizziness: Acute illness - Alcohol intoxication: Acute illness This patient was maintained on a cardiac monitor/telemetry. I personally viewed and interpreted the cardiac monitor which reveals an underlying rhythm of NSR Medications given in ED: IVF, thiamine Reevaluation of the patient after these medicines showed that the patient stayed the same I have reviewed home  medications and made changes accordingly.  Critical Care Interventions: n/a Consultations: tts Disposition: tts evaluation, reevaluate once clinically sobor  Portions of this note were generated with Dragon dictation software. Dictation errors may occur despite best attempts at proofreading.    Final Clinical Impression(s) / ED Diagnoses Final diagnoses:  Suicidal ideation  Dizziness  Alcohol abuse with intoxication [facility])    Rx / DC Orders ED Discharge Orders     None         Adolphus Birchwood, PA-C 12/09/21 0001    Mesner, Corene Cornea, MD 12/09/21 0003

## 2021-12-09 DIAGNOSIS — F1024 Alcohol dependence with alcohol-induced mood disorder: Secondary | ICD-10-CM

## 2021-12-09 DIAGNOSIS — F10929 Alcohol use, unspecified with intoxication, unspecified: Secondary | ICD-10-CM

## 2021-12-09 NOTE — BH Assessment (Signed)
Per the 9am morning huddle/bed meeting with Dr. Timoteo Ace, Provider to assess. No TTS CCA is needed at this time.

## 2021-12-09 NOTE — ED Notes (Signed)
Pts personal belongings are in cabinets labelled Rooms 1-4 ( 2 Bags)

## 2021-12-09 NOTE — Discharge Instructions (Addendum)
  Discharge recommendations:  Patient is to take medications as prescribed. Please see information for follow-up appointment with psychiatry and therapy. Please follow up with your primary care provider for all medical related needs.   Therapy: We recommend that patient participate in individual therapy to address mental health concerns.  Medications: The patient is to contact a medical professional and/or outpatient provider to address any new side effects that develop. Patient should update outpatient providers of any new medications and/or medication changes.   Safety:  The patient should abstain from use of illicit substances/drugs and abuse of any medications. If symptoms worsen or do not continue to improve or if the patient becomes actively suicidal or homicidal then it is recommended that the patient return to the closest hospital emergency department, the [facility], or call 911 for further evaluation and treatment. National Suicide Prevention Lifeline 1-800-SUICIDE or 8476825347.  About 988 988 offers 24/7 access to trained crisis counselors who can help people experiencing mental health-related distress. People can call or text 988 or chat 988lifeline.org for themselves or if they are worried about a loved one who may need crisis support.  Crisis Mobile: [facility]:                     [phone] (for crisis response 24 hours a day) [place]:                                            [phone]
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
## 2021-12-09 NOTE — ED Provider Notes (Signed)
I assumed care of the patient at 0 700.  Patient was awaiting for psychiatric clearance.  Has been seen by psychiatry this morning and was cleared.  Will discharge home.  PCP follow-up.   Deno Etienne, DO 12/09/21 1418

## 2021-12-09 NOTE — Discharge Summary (Signed)
[facility] Psych ED Discharge  12/09/2021 1:57 PM Barry Moore  MRN:  [id number]  Principal Problem: Alcohol dependence with alcohol-induced mood disorder [facility]) Discharge Diagnoses: Principal Problem:   Alcohol dependence with alcohol-induced mood disorder ([place]) Active Problems:   Homeless   Alcohol intoxication ([place])  Clinical Impression:  Final diagnoses:  Suicidal ideation  Dizziness  Alcohol abuse with intoxication ([place])  Alcohol-induced mood disorder ([facility])   Subjective:  Barry Moore is a 56 y.o. male with a history of severe alcohol use disorder and alcohol induced mood disorder who presented to [facility] due to suicidal ideation.He is well-known to behavioral health services and area emergency departments.  He often presents to the emergency department intoxicated and reporting suicidal thoughts.  After he is no longer intoxicated and has had some sleep, he typically denies suicidal ideations.  Patient reports that he drinks 4 to 5 (40 ounce) beers daily.  BAL 372 on admission to the ED. Reports a history of withdrawal seizures. Denies use of substances other than alcohol. UDS negative.  Patient evaluated this afternoon and case discussed with Dr.Maurer. He is alert and oriented x 4. He is calm and cooperative. Eye contact is good. Speech is clear and coherent. Mood is dysphoric. Affect is congruent with mood. Thought process is coherent and thought content is logical. Denies auditory and visual hallucinations. No indication that patient is responding to internal stimuli. No evidence of delusional thought content. Denies suicidal ideations. Denies homicidal ideations.   ED Assessment Time Calculation: Start Time: 1300 Stop Time: 1325 Total Time in Minutes (Assessment Completion): 25   Past Psychiatric History: Reported history of depression, anxiety, and PTSD.  Significant history for alcohol use disorder with frequent presentations for suicidal or homicidal ideations.  Chronic  homelessness for around 20 years.  Past Medical History:  Past Medical History:  Diagnosis Date   Alcoholic hepatitis    Depression    ETOH abuse    Gastric bleed 08/2018   Hypertension    Pancreatitis    Seizures ([place])    Suicidal behavior     Past Surgical History:  Procedure Laterality Date   BIOPSY  08/05/2018   Procedure: BIOPSY;  Surgeon: Irving Copas., MD;  Location: [facility] ENDOSCOPY;  Service: Gastroenterology;;   BIOPSY  08/07/2018   Procedure: BIOPSY;  Surgeon: Thornton Park, MD;  Location: [facility] ENDOSCOPY;  Service: Gastroenterology;;   BIOPSY  01/02/2019   Procedure: BIOPSY;  Surgeon: Gatha Mayer, MD;  Location: [facility] ENDOSCOPY;  Service: Endoscopy;;   COLONOSCOPY WITH PROPOFOL N/A 08/07/2018   Procedure: COLONOSCOPY WITH PROPOFOL;  Surgeon: Thornton Park, MD;  Location: [place];  Service: Gastroenterology;  Laterality: N/A;   ESOPHAGOGASTRODUODENOSCOPY N/A 01/02/2019   Procedure: ESOPHAGOGASTRODUODENOSCOPY (EGD);  Surgeon: Gatha Mayer, MD;  Location: [facility] END[facility];  Service: Endoscopy;  Laterality: N/A;   ESOPHAGOGASTRODUODENOSCOPY N/A 07/03/2019   Procedure: ESOPHAGOGASTRODUODENOSCOPY (EGD);  Surgeon: Wonda Horner, MD;  Location: [facility] ENDOSCOPY;  Service: Endoscopy;  Laterality: N/A;   ESOPHAGOGASTRODUODENOSCOPY N/A 06/26/2020   Procedure: ESOPHAGOGASTRODUODENOSCOPY (EGD);  Surgeon: Arta Silence, MD;  Location: [facility] ENDOS[facility];  Service: Endoscopy;  Laterality: N/A;   ESOPHAGOGASTRODUODENOSCOPY (EGD) WITH PROPOFOL N/A 11/02/2016   Procedure: ESOPHAGOGASTRODUODENOSCOPY (EGD) WITH PROPOFOL;  Surgeon: Carol Ada, MD;  Location: [facility] ENDOSCOPY;  Service: Endoscopy;  Laterality: N/A;   ESOPHAGOGASTRODUODENOSCOPY (EGD) WITH PROPOFOL N/A 08/05/2018   Procedure: ESOPHAGOGASTRODUODENOSCOPY (EGD) WITH PROPOFOL;  Surgeon: Rush Landmark Telford Nab., MD;  Location: [place];  Service: Gastroenterology;  Laterality: N/A;   ESOPHAGOGASTRODUODENOSCOPY (EGD) WITH  PROPOFOL N/A 07/14/2019   Procedure: ESOPHAGOGASTRODUODENOSCOPY (EGD) WITH PROPOFOL;  Surgeon: Otis Brace, MD;  Location: [facility] ENDOSCOPY;  Service: Gastroenterology;  Laterality: N/A;   HERNIA REPAIR     LEG SURGERY     POLYPECTOMY  08/07/2018   Procedure: POLYPECTOMY;  Surgeon: Thornton Park, MD;  Location: [facility] ENDOSCOPY;  Service: Gastroenterology;;   Family History:  Family History  Problem Relation Age of Onset   Diabetes Mother    Alcoholism Mother    Emphysema Father    Lung cancer Father    Alcoholism Father    Social History:  Social History   Substance and Sexual Activity  Alcohol Use Yes   Comment: 5+ BEERS DAILY, Patient does not know how much he drank tonight     Social History   Substance and Sexual Activity  Drug Use Not Currently   Frequency: 3.0 times per week   Types: Cocaine    Social History   Socioeconomic History   Marital status: Divorced    Spouse name: Not on file   Number of children: Not on file   Years of education: Not on file   Highest education level: Not on file  Occupational History   Not on file  Tobacco Use   Smoking status: Every Day    Packs/day: 1.00    Types: Cigarettes   Smokeless tobacco: Never  Vaping Use   Vaping Use: Never used  Substance and Sexual Activity   Alcohol use: Yes    Comment: 5+ BEERS DAILY, Patient does not know how much he drank tonight   Drug use: Not Currently    Frequency: 3.0 times per week    Types: Cocaine   Sexual activity: Not Currently  Other Topics Concern   Not on file  Social History Narrative   Not on file   Social Determinants of Health   Financial Resource Strain: Not on file  Food Insecurity: Not on file  Transportation Needs: Not on file  Physical Activity: Not on file  Stress: Not on file  Social Connections: Not on file    Tobacco Cessation:  A prescription for an FDA-approved tobacco cessation medication was offered at discharge and the patient refused  Current  Medications: Current Facility-Administered Medications  Medication Dose Route Frequency Provider Last Rate Last Admin   acetaminophen (TYLENOL) tablet 650 mg  650 mg Oral Q4H PRN Loeffler, Grace C, PA-C       citalopram (CELEXA) tablet 10 mg  10 mg Oral Daily Loeffler, Grace C, PA-C   10 mg at 12/09/21 0934   LORazepam (ATIVAN) injection 0-4 mg  0-4 mg Intravenous Q6H Loeffler, Grace C, PA-C       Or   LORazepam (ATIVAN) tablet 0-4 mg  0-4 mg Oral Q6H Loeffler, Grace C, PA-C   1 mg at 12/09/21 1225   [START ON 12/11/2021] LORazepam (ATIVAN) injection 0-4 mg  0-4 mg Intravenous Q12H Loeffler, Grace C, PA-C       Or   [START ON 12/11/2021] LORazepam (ATIVAN) tablet 0-4 mg  0-4 mg Oral Q12H Loeffler, Grace C, PA-C       ondansetron (ZOFRAN) tablet 4 mg  4 mg Oral Q8H PRN Loeffler, Grace C, PA-C       thiamine (VITAMIN B1) tablet 100 mg  100 mg Oral Daily Loeffler, Grace C, PA-C   100 mg at 12/09/21 6834   Or   thiamine (VITAMIN B1) injection 100 mg  100 mg Intravenous Daily Loeffler, Adora Fridge, PA-C  Current Outpatient Medications  Medication Sig Dispense Refill   citalopram (CELEXA) 10 MG tablet Take 1 tablet (10 mg total) by mouth daily. (Patient not taking: Reported on 12/09/2021) 7 tablet 0   PTA Medications: (Not in a hospital admission)   [place] Scale:  [place] ED from 12/08/2021 in [place] DEPT ED from 12/06/2021 in [place] DEPT ED from 11/27/2021 in [place] DEPT  C-SSRS RISK CATEGORY High Risk Error: Q3, 4, or 5 should not be populated when Q2 is No No Risk       Musculoskeletal: Strength & Muscle Tone: within normal limits   Psychiatric Specialty Exam: Presentation  General Appearance: Disheveled  Eye Contact:Good  Speech:Clear and Coherent; Normal Rate  Speech Volume:Normal  Handedness:Right   Mood and Affect  Mood:Dysphoric  Affect:Congruent   Thought  Process  Thought Processes:Coherent; Linear  Descriptions of Associations:Intact  Orientation:Full (Time, Place and Person)  Thought Content:Logical  History of Schizophrenia/Schizoaffective disorder:No  Duration of Psychotic Symptoms:N/A  Hallucinations:Hallucinations: None  Ideas of Reference:None  Suicidal Thoughts:Suicidal Thoughts: No  Homicidal Thoughts:Homicidal Thoughts: No   Sensorium  Memory:Immediate Good; Recent Good  Judgment:Fair  Insight:Fair   Executive Functions  Concentration:Fair  Attention Span:Fair  [place] of Knowledge:Good  Language:Good   Psychomotor Activity  Psychomotor Activity:Psychomotor Activity: Tremor   Assets  Assets:Communication Skills; Desire for Improvement   Sleep  Sleep:Sleep: Fair    Physical Exam: Physical Exam Nursing note reviewed.  Eyes:     General:        Right eye: No discharge.        Left eye: No discharge.  Cardiovascular:     Rate and Rhythm: Normal rate.  Musculoskeletal:        General: Normal range of motion.  Neurological:     Mental Status: He is alert and oriented to person, place, and time.  Psychiatric:        Behavior: Behavior is cooperative.        Thought Content: Thought content is not paranoid or delusional. Thought content does not include homicidal or suicidal ideation.    Review of Systems  Respiratory:  Negative for cough and shortness of breath.   Cardiovascular:  Negative for chest pain.  Gastrointestinal:  Negative for diarrhea, nausea and vomiting.  Neurological:  Negative for seizures.  Psychiatric/Behavioral:  Positive for depression and substance abuse. Negative for hallucinations, memory loss and suicidal ideas. The patient has insomnia.    Blood pressure (!) 167/80, pulse 74, temperature 98.5 F (36.9 C), temperature source Oral, resp. rate 18, height '5\' 8"'$  (1.727 m), weight 68 kg, SpO2 96 %. Body mass index is 22.81 kg/m.   Demographic Factors:   Male, Caucasian, Low socioeconomic status, and Unemployed  Loss Factors: Financial problems/change in socioeconomic status  Historical Factors: Prior suicide attempts and Family history of mental illness or substance abuse  Risk Reduction Factors:   Religious beliefs about death  Continued Clinical Symptoms:  Alcohol/Substance Abuse/Dependencies  Cognitive Features That Contribute To Risk:  Closed-mindedness    Suicide Risk:  Mild:  Suicidal ideation of limited frequency, intensity, duration, and specificity.  There are no identifiable plans, no associated intent, mild dysphoria and related symptoms, good self-control (both objective and subjective assessment), few other risk factors, and identifiable protective factors, including available and accessible social support.   [place] Follow up.   Specialty: Urgent Care Contact information: [phone] [phone]        Services, [facility] Follow up.   Contact information: [address] [place] [phone]                Medical Decision Making: Barry Moore is a 56 y.o. male with a history of severe alcohol use disorder and alcohol induced mood disorder who presented to [facility] due to suicidal ideation.He is well-known to behavioral health services and area emergency departments.  He often presents to the emergency department intoxicated and reporting suicidal thoughts.  After he is no longer intoxicated and has had some sleep, he typically denies suicidal ideations.  Patient reports that he drinks 4 to 5 (40 ounce) beers daily.  BAL 372 on admission to the ED. Reports a history of withdrawal seizures.   At time of discharge, patient denies SI, HI, AVH and is able to contract for safety. He demonstrated no overt evidence of psychosis or mania. Prior to discharge verbalized that he understood warning  signs, triggers, and symptoms of worsening mental health and how to access emergency mental health care if they felt it was needed. Patient was instructed to call 911 or return to the emergency room if they experienced any concerning symptoms after discharge. Patient voiced understanding and agreed to this.   Problem 1: Alcohol intoxication  Problem 2: Alcohol induced mood disorder   Disposition: No evidence of imminent risk to self or others at present.   Patient does not meet criteria for psychiatric inpatient admission. Supportive therapy provided about ongoing stressors. Discussed crisis plan, support from social network, calling 911, coming to the Emergency Department, and calling Suicide Hotline.     Discharge Instructions       Discharge recommendations:  Patient is to take medications as prescribed. Please see information for follow-up appointment with psychiatry and therapy. Please follow up with your primary care provider for all medical related needs.   Therapy: We recommend that patient participate in individual therapy to address mental health concerns.  Medications: The patient is to contact a medical professional and/or outpatient provider to address any new side effects that develop. Patient should update outpatient providers of any new medications and/or medication changes.   Safety:  The patient should abstain from use of illicit substances/drugs and abuse of any medications. If symptoms worsen or do not continue to improve or if the patient becomes actively suicidal or homicidal then it is recommended that the patient return to the closest hospital emergency department, the [facility], or call 911 for further evaluation and treatment. National Suicide Prevention Lifeline 1-800-SUICIDE or 726-286-9022.  About 988 988 offers 24/7 access to trained crisis counselors who can help people experiencing mental health-related distress.  People can call or text 988 or chat 988lifeline.org for themselves or if they are worried about a loved one who may need crisis support.  Crisis Mobile: [facility]:                     [phone] (for crisis response 24 hours a day) [place]:                                            [phone]       Rozetta Nunnery, NP 12/09/2021, 1:57 PM

## 2022-01-11 ENCOUNTER — Emergency Department (HOSPITAL_COMMUNITY): Payer: Self-pay

## 2022-01-11 ENCOUNTER — Other Ambulatory Visit: Payer: Self-pay

## 2022-01-11 ENCOUNTER — Encounter (HOSPITAL_COMMUNITY): Payer: Self-pay | Admitting: Emergency Medicine

## 2022-01-11 ENCOUNTER — Emergency Department (HOSPITAL_COMMUNITY)
Admission: EM | Admit: 2022-01-11 | Discharge: 2022-01-12 | Disposition: A | Discharge status: Home / Self Care | Payer: Self-pay | Attending: Emergency Medicine | Admitting: Emergency Medicine

## 2022-01-11 DIAGNOSIS — F1010 Alcohol abuse, uncomplicated: Secondary | ICD-10-CM | POA: Insufficient documentation

## 2022-01-11 DIAGNOSIS — Y907 Blood alcohol level of 200-239 mg/100 ml: Secondary | ICD-10-CM | POA: Insufficient documentation

## 2022-01-11 DIAGNOSIS — R040 Epistaxis: Secondary | ICD-10-CM | POA: Insufficient documentation

## 2022-01-11 DIAGNOSIS — R519 Headache, unspecified: Secondary | ICD-10-CM | POA: Insufficient documentation

## 2022-01-11 NOTE — ED Notes (Signed)
Patient arrives in c-collar, cspine cleared by [place], [place].
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
## 2022-01-12 ENCOUNTER — Emergency Department (HOSPITAL_COMMUNITY)
Admission: EM | Admit: 2022-01-12 | Discharge: 2022-01-13 | Disposition: A | Discharge status: Home / Self Care | Payer: Self-pay | Attending: Emergency Medicine | Admitting: Emergency Medicine

## 2022-01-12 DIAGNOSIS — Z5900 Homelessness unspecified: Secondary | ICD-10-CM | POA: Insufficient documentation

## 2022-01-12 DIAGNOSIS — F1029 Alcohol dependence with unspecified alcohol-induced disorder: Secondary | ICD-10-CM | POA: Insufficient documentation

## 2022-01-12 LAB — CBC WITH DIFFERENTIAL/PLATELET
Abs Immature Granulocytes: 0.02 10*3/uL (ref 0.00–0.07)
Basophils Absolute: 0.1 10*3/uL (ref 0.0–0.1)
Basophils Relative: 2 %
Eosinophils Absolute: 0.1 10*3/uL (ref 0.0–0.5)
Eosinophils Relative: 1 %
HCT: 33.1 % — ABNORMAL LOW (ref 39.0–52.0)
Hemoglobin: 11.1 g/dL — ABNORMAL LOW (ref 13.0–17.0)
Immature Granulocytes: 0 %
Lymphocytes Relative: 29 %
Lymphs Abs: 1.7 10*3/uL (ref 0.7–4.0)
MCH: 28 pg (ref 26.0–34.0)
MCHC: 33.5 g/dL (ref 30.0–36.0)
MCV: 83.6 fL (ref 80.0–100.0)
Monocytes Absolute: 0.8 10*3/uL (ref 0.1–1.0)
Monocytes Relative: 14 %
Neutro Abs: 3.3 10*3/uL (ref 1.7–7.7)
Neutrophils Relative %: 54 %
Platelets: 115 10*3/uL — ABNORMAL LOW (ref 150–400)
RBC: 3.96 MIL/uL — ABNORMAL LOW (ref 4.22–5.81)
RDW: 17 % — ABNORMAL HIGH (ref 11.5–15.5)
WBC: 6 10*3/uL (ref 4.0–10.5)
nRBC: 0 % (ref 0.0–0.2)

## 2022-01-12 LAB — COMPREHENSIVE METABOLIC PANEL
ALT: 63 U/L — ABNORMAL HIGH (ref 0–44)
AST: 149 U/L — ABNORMAL HIGH (ref 15–41)
Albumin: 4.1 g/dL (ref 3.5–5.0)
Alkaline Phosphatase: 131 U/L — ABNORMAL HIGH (ref 38–126)
Anion gap: 14 (ref 5–15)
BUN: 5 mg/dL — ABNORMAL LOW (ref 6–20)
CO2: 21 mmol/L — ABNORMAL LOW (ref 22–32)
Calcium: 8.5 mg/dL — ABNORMAL LOW (ref 8.9–10.3)
Chloride: 101 mmol/L (ref 98–111)
Creatinine, Ser: 0.66 mg/dL (ref 0.61–1.24)
GFR, Estimated: >60 mL/min (ref >60)
Glucose, Bld: 106 mg/dL — ABNORMAL HIGH (ref 70–99)
Potassium: 3.7 mmol/L (ref 3.5–5.1)
Sodium: 136 mmol/L (ref 135–145)
Total Bilirubin: 0.7 mg/dL (ref 0.3–1.2)
Total Protein: 8 g/dL (ref 6.5–8.1)

## 2022-01-12 LAB — ETHANOL: Alcohol, Ethyl (B): 351 mg/dL (ref <10)

## 2022-01-12 MED ORDER — IBUPROFEN 400 MG PO TABS
400.0000 mg | ORAL_TABLET | Freq: Once | ORAL | Status: AC
  Administered 2022-01-12: 400 mg via ORAL
  Filled 2022-01-12: qty 1

## 2022-01-12 NOTE — ED Notes (Signed)
Pt given a Coke, crackers x2, and a sandwich bag.

## 2022-01-12 NOTE — ED Notes (Signed)
Urine sent to lab @ 2245

## 2022-01-12 NOTE — ED Notes (Signed)
X1 vitals recheck with no response 
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
## 2022-01-12 NOTE — ED Notes (Signed)
Pt verbalized understanding of discharge instructions. Opportunity for questions provided.    Pt given a bus pass.
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
## 2022-01-12 NOTE — ED Notes (Addendum)
Pt is currently eating tray from cafeteria.  Will d/c after he is finished.   Pt given a bus pass to prepare for d/c.

## 2022-01-12 NOTE — ED Notes (Signed)
Pt was able tolerate crackers and half a sandwich.

## 2022-01-12 NOTE — ED Notes (Signed)
ED Provider at bedside. 
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
## 2022-01-13 NOTE — Discharge Instructions (Signed)
I have given you resources for alcohol dependency please review, you may also follow-up with the behavioral health urgent care for further evaluation given you information for shelters within the area please review   Come back to the emergency department if you develop chest pain, shortness of breath, severe abdominal pain, uncontrolled nausea, vomiting, diarrhea.

## 2022-01-19 ENCOUNTER — Other Ambulatory Visit (HOSPITAL_COMMUNITY)
Admission: EM | Admit: 2022-01-19 | Discharge: 2022-01-21 | Disposition: A | Discharge status: Home / Self Care | Payer: No Payment, Other | Attending: Psychiatry | Admitting: Psychiatry

## 2022-01-19 ENCOUNTER — Encounter (HOSPITAL_COMMUNITY): Payer: Self-pay | Admitting: Registered Nurse

## 2022-01-19 DIAGNOSIS — Z1152 Encounter for screening for COVID-19: Secondary | ICD-10-CM | POA: Insufficient documentation

## 2022-01-19 DIAGNOSIS — F1010 Alcohol abuse, uncomplicated: Secondary | ICD-10-CM | POA: Diagnosis present

## 2022-01-19 DIAGNOSIS — F10129 Alcohol abuse with intoxication, unspecified: Secondary | ICD-10-CM | POA: Insufficient documentation

## 2022-01-19 DIAGNOSIS — Z5900 Homelessness unspecified: Secondary | ICD-10-CM | POA: Insufficient documentation

## 2022-01-19 LAB — CBC WITH DIFFERENTIAL/PLATELET
Abs Immature Granulocytes: 0.02 10*3/uL (ref 0.00–0.07)
Basophils Absolute: 0.1 10*3/uL (ref 0.0–0.1)
Basophils Relative: 2 %
Eosinophils Absolute: 0.1 10*3/uL (ref 0.0–0.5)
Eosinophils Relative: 3 %
HCT: 33.2 % — ABNORMAL LOW (ref 39.0–52.0)
Hemoglobin: 10.8 g/dL — ABNORMAL LOW (ref 13.0–17.0)
Immature Granulocytes: 0 %
Lymphocytes Relative: 38 %
Lymphs Abs: 1.8 10*3/uL (ref 0.7–4.0)
MCH: 27.3 pg (ref 26.0–34.0)
MCHC: 32.5 g/dL (ref 30.0–36.0)
MCV: 84.1 fL (ref 80.0–100.0)
Monocytes Absolute: 0.8 10*3/uL (ref 0.1–1.0)
Monocytes Relative: 17 %
Neutro Abs: 2 10*3/uL (ref 1.7–7.7)
Neutrophils Relative %: 40 %
Platelets: 161 10*3/uL (ref 150–400)
RBC: 3.95 MIL/uL — ABNORMAL LOW (ref 4.22–5.81)
RDW: 16.7 % — ABNORMAL HIGH (ref 11.5–15.5)
WBC: 4.8 10*3/uL (ref 4.0–10.5)
nRBC: 0 % (ref 0.0–0.2)

## 2022-01-19 LAB — COMPREHENSIVE METABOLIC PANEL
ALT: 68 U/L — ABNORMAL HIGH (ref 0–44)
AST: 140 U/L — ABNORMAL HIGH (ref 15–41)
Albumin: 4 g/dL (ref 3.5–5.0)
Alkaline Phosphatase: 144 U/L — ABNORMAL HIGH (ref 38–126)
Anion gap: 14 (ref 5–15)
BUN: 7 mg/dL (ref 6–20)
CO2: 22 mmol/L (ref 22–32)
Calcium: 9.2 mg/dL (ref 8.9–10.3)
Chloride: 102 mmol/L (ref 98–111)
Creatinine, Ser: 0.64 mg/dL (ref 0.61–1.24)
GFR, Estimated: >60 mL/min (ref >60)
Glucose, Bld: 89 mg/dL (ref 70–99)
Potassium: 3.8 mmol/L (ref 3.5–5.1)
Sodium: 138 mmol/L (ref 135–145)
Total Bilirubin: 0.4 mg/dL (ref 0.3–1.2)
Total Protein: 8.1 g/dL (ref 6.5–8.1)

## 2022-01-19 LAB — POCT URINE DRUG SCREEN - MANUAL ENTRY (I-SCREEN)
POC Amphetamine UR: NOT DETECTED
POC Buprenorphine (BUP): NOT DETECTED
POC Cocaine UR: NOT DETECTED
POC Marijuana UR: NOT DETECTED
POC Methadone UR: NOT DETECTED
POC Methamphetamine UR: NOT DETECTED
POC Morphine: NOT DETECTED
POC Oxazepam (BZO): NOT DETECTED
POC Oxycodone UR: NOT DETECTED
POC Secobarbital (BAR): NOT DETECTED

## 2022-01-19 LAB — LIPID PANEL
Cholesterol: 251 mg/dL — ABNORMAL HIGH (ref 0–200)
HDL: 105 mg/dL (ref >40)
LDL Cholesterol: 113 mg/dL — ABNORMAL HIGH (ref 0–99)
Total CHOL/HDL Ratio: 2.4 RATIO
Triglycerides: 163 mg/dL — ABNORMAL HIGH (ref <150)
VLDL: 33 mg/dL (ref 0–40)

## 2022-01-19 LAB — HEMOGLOBIN A1C
Hgb A1c MFr Bld: 5.3 % (ref 4.8–5.6)
Mean Plasma Glucose: 105.41 mg/dL

## 2022-01-19 LAB — TSH: TSH: 0.415 u[IU]/mL (ref 0.350–4.500)

## 2022-01-19 LAB — RESP PANEL BY RT-PCR (FLU A&B, COVID) ARPGX2
Influenza A by PCR: NEGATIVE
Influenza B by PCR: NEGATIVE
SARS Coronavirus 2 by RT PCR: NEGATIVE

## 2022-01-19 LAB — ETHANOL: Alcohol, Ethyl (B): 290 mg/dL — ABNORMAL HIGH (ref <10)

## 2022-01-19 LAB — POC SARS CORONAVIRUS 2 AG: SARSCOV2ONAVIRUS 2 AG: NEGATIVE

## 2022-01-19 MED ORDER — MAGNESIUM HYDROXIDE 400 MG/5ML PO SUSP: 30.0000 mL | Freq: Every day | ORAL | Status: DC | PRN

## 2022-01-19 MED ORDER — LORAZEPAM 1 MG PO TABS
1.0000 mg | ORAL_TABLET | Freq: Four times a day (QID) | ORAL | Status: AC
  Administered 2022-01-19 – 2022-01-20 (×4): 1 mg via ORAL
  Filled 2022-01-19 (×4): qty 1

## 2022-01-19 MED ORDER — HYDROXYZINE HCL 25 MG PO TABS: 25.0000 mg | ORAL_TABLET | Freq: Four times a day (QID) | ORAL | Status: DC | PRN

## 2022-01-19 MED ORDER — TRAZODONE HCL 50 MG PO TABS
50.0000 mg | ORAL_TABLET | Freq: Every evening | ORAL | Status: DC | PRN
  Filled 2022-01-19: qty 1

## 2022-01-19 MED ORDER — THIAMINE HCL 100 MG/ML IJ SOLN
100.0000 mg | Freq: Once | INTRAMUSCULAR | Status: AC
  Administered 2022-01-19: 100 mg via INTRAMUSCULAR
  Filled 2022-01-19: qty 2

## 2022-01-19 MED ORDER — THIAMINE MONONITRATE 100 MG PO TABS
100.0000 mg | ORAL_TABLET | Freq: Every day | ORAL | Status: DC
  Administered 2022-01-20 – 2022-01-21 (×2): 100 mg via ORAL
  Filled 2022-01-19 (×2): qty 1

## 2022-01-19 MED ORDER — LORAZEPAM 1 MG PO TABS: 1.0000 mg | ORAL_TABLET | Freq: Four times a day (QID) | ORAL | Status: DC | PRN

## 2022-01-19 MED ORDER — GABAPENTIN 300 MG PO CAPS
300.0000 mg | ORAL_CAPSULE | Freq: Three times a day (TID) | ORAL | Status: DC
  Administered 2022-01-19 – 2022-01-21 (×6): 300 mg via ORAL
  Filled 2022-01-19 (×7): qty 1

## 2022-01-19 MED ORDER — LORAZEPAM 1 MG PO TABS: 1.0000 mg | ORAL_TABLET | Freq: Two times a day (BID) | ORAL | Status: DC

## 2022-01-19 MED ORDER — ONDANSETRON 4 MG PO TBDP: 4.0000 mg | ORAL_TABLET | Freq: Four times a day (QID) | ORAL | Status: DC | PRN

## 2022-01-19 MED ORDER — ACETAMINOPHEN 325 MG PO TABS
650.0000 mg | ORAL_TABLET | Freq: Four times a day (QID) | ORAL | Status: DC | PRN
  Administered 2022-01-19: 650 mg via ORAL
  Filled 2022-01-19: qty 2

## 2022-01-19 MED ORDER — ADULT MULTIVITAMIN W/MINERALS CH
1.0000 | ORAL_TABLET | Freq: Every day | ORAL | Status: DC
  Administered 2022-01-19 – 2022-01-21 (×3): 1 via ORAL
  Filled 2022-01-19 (×3): qty 1

## 2022-01-19 MED ORDER — LOPERAMIDE HCL 2 MG PO CAPS: 2.0000 mg | ORAL_CAPSULE | ORAL | Status: DC | PRN

## 2022-01-19 MED ORDER — ALUM & MAG HYDROXIDE-SIMETH 200-200-20 MG/5ML PO SUSP: 30.0000 mL | ORAL | Status: DC | PRN

## 2022-01-19 MED ORDER — LORAZEPAM 1 MG PO TABS: 1.0000 mg | ORAL_TABLET | Freq: Every day | ORAL | Status: DC

## 2022-01-19 MED ORDER — LORAZEPAM 1 MG PO TABS
1.0000 mg | ORAL_TABLET | Freq: Three times a day (TID) | ORAL | Status: AC
  Administered 2022-01-20 – 2022-01-21 (×3): 1 mg via ORAL
  Filled 2022-01-19 (×3): qty 1

## 2022-01-19 NOTE — ED Notes (Signed)
Pt is in the bed resting. Respirations are even and unlabored. No acute distress noted. Will continue to monitor for safety 

## 2022-01-19 NOTE — BH Assessment (Addendum)
Comprehensive Clinical Assessment (CCA) Screening, Triage and Referral Note  01/19/2022 Barry Moore [id number]   Screening/Triage complete. Patient is Urgent. MSE pending. Patient is in room 135.  Chief Complaint: Alcohol Detox and Suicidal Ideations  Visit Diagnosis: Depressive Disorder, Severe; Substance Induced Mood Disorder, Alcohol Use Disorder, Severe; Alcohol Intoxication.  Patient Reported Information How did you hear about [name]? Self  What Is the Reason for Your Visit/Call Today?  Barry Moore is a 56 year old male  with history of depressive symptoms, suicidal ideations, and alcohol abuse. Pt has a long history of alcohol use, chronic homelessness, and frequent presentations to emergency services. Pt says he came to the [facility] today via [employer] after someone called 911. He reports recurring suicidal ideation with no specific plan. Pt says, "I don't care anymore" and "whatever happens, happens." He says he believes he is "drinking myself to death." He has a hx of a one suicide attempts. He describes his mood as "very depressed" and acknowledges symptoms including social withdrawal, loss of interest in usual pleasures, fatigue, irritability, decreased concentration, decreased sleep, decreased appetite and feelings of guilt, worthlessness and hopelessness. Denies HI and AVH's. He reports drinking alcohol daily up to (5) 40 ounce beers per use. Last drink was prior to arrival, (5) 40 ounce beers. He acknowledges he has been prescribed psychiatric medication but has not been taking them. He cannot remember when he was last psychiatrically hospitalized and Pt's medical record indicates he was inpatient at [facility] 04/23-05/05/2021.  How Long Has This Been Causing You Problems? > than 6 months  What Do You Feel Would Help You the Most Today? Alcohol or Drug Use Treatment; Treatment for Depression or other mood problem; Medication(s)   Have You Recently Had Any Thoughts About Hurting  Yourself? Yes  Are You Planning to Commit Suicide/Harm Yourself At This time? No   Have you Recently Had Thoughts About [place]? Yes  Are You Planning to Harm Someone at This Time? No  Explanation: N/A  Have You Used Any Alcohol or Drugs in the Past 24 Hours? Yes  How Long Ago Did You Use Drugs or Alcohol?  Alcohol (prior to arrival) What Did You Use and How Much? Pt reports drinking-  cans of 40-ounce beer.   Do You Currently Have a Therapist/Psychiatrist? No  Name of Therapist/Psychiatrist: No  Have You Been Recently Discharged From Any Office Practice or Programs? No  Explanation of Discharge From Practice/Program: NA    Does Patient Present under Involuntary Commitment? No  IVC Papers Initial File Date: No data recorded  [place] of Residence: [place]   Patient Currently Receiving the Following Services: No   Determination of Need: Emergent (2 hours)   Options For Referral: Inpatient Hospitalization; Medication Management; Outpatient Therapy   Discharge Disposition: Admitted to Obs, per Elvin So, NP.     Waldon Merl, Counselor

## 2022-01-19 NOTE — ED Notes (Signed)
Pt is sleeping. No distress noted. Will continue to monitor safety. 

## 2022-01-19 NOTE — ED Notes (Signed)
Patient A&Ox4. Patient reports SI no plan or intent. Patient denies HI and AVH. Patient denies any physical complaints when asked. No acute distress noted. Support and encouragement provided. Routine safety checks conducted according to facility protocol. Encouraged patient to notify staff if thoughts of harm toward self or others arise. Patient verbalize understanding and agreement. Will continue to monitor for safety.

## 2022-01-19 NOTE — ED Provider Notes (Signed)
[facility] Continuous Assessment Admission H&P  Date: 01/19/22 Patient Name: Barry Moore MRN: [id number] Chief Complaint: "detox"    Diagnoses:  Final diagnoses:  Alcohol abuse with intoxication [facility])   HPI:  Pt presents voluntarily to [facility] behavioral health for walk-in assessment.  Pt is escorted by [employer]. Pt is assessed face-to-face by nurse practitioner.   Kristian Covey, 56 y.o., male patient seen face to face by this provider, consulted with Dr. Dwyane Dee; and chart reviewed on 01/19/22. On evaluation Barry Moore reports he is presenting today for "detox". Pt presents disheveled, malodorous, with slurred speech. Pt appears to be inebriated during assessment. Pt reports drinking "6 40s of beer" this morning. When asked when his last drink was, pt states he does not know. He presents as poor historian. Pt is oriented to situation, place, person; oriented to month; states he does not know year. 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   Pt reports depressed,dysphoric mood and passive SI w/o plan or intent. Pt denies HI/VI. Pt denies AVH, paranoia.  Pt reports he has been homeless for the past 25 years.   Pt denies access to a firearm or other weapon.   Pt reports highest level of education is the 11th grade.  Pt is not currently employed.  Pt denies knowledge of family psychiatric hx.   Pt w/ hx of inpatient psychiatric hospitalization, last [facility] 07/16/2021-07/23/2021, due to homicidal ideation towards his brother in law.  Pt reports hx of 1 SA 10 years ago, OD on medications. Pt denies hx of NSSIB.  Pt not currently connected with medication management or counseling.   Pt's caseworker intern, Overton Mam, from congregational nursing presents to facility. Collateral obtained w/ pt verbal consent. Per [facility], pt presented inebriated during session. Pt endorsed passive SI, w/o plan. When [facility] asked if he did have a plan what it would be, pt  reported pt would jump in front of a train. Pt had told her "he didn't want to do it today".   Pt to be admitted to continuous assessment and to be re-evaluated tomorrow.   Pt reports he feels he is currently in withdrawal. He reports nausea and tremors. Fine distal tremor noted b/l on hands/fingers.  PHQ 2-9:  [place] [facility] from 08/07/2021 in [place] [facility]PT ED from 09/04/2020 in [place] ED from 06/17/2020 in [place] DEPT  Thoughts that you would be better off dead, or of hurting yourself in some way More than half the days More than half the days More than half the days  PHQ-9 Total Score '14 18 21       '$ Flowsheet Row ED from 01/11/2022 in [place] ED from 12/08/2021 in [place] DEPT ED from 12/06/2021 in [place] DEPT  C-SSRS RISK CATEGORY No Risk High Risk Error: Q3, 4, or 5 should not be populated when Q2 is No        Total Time spent with patient: 20 minutes  Musculoskeletal  Strength & Muscle Tone:  sitting during assessment Gait & Station:  sitting during assessment Patient leans:  sitting during assessment  Psychiatric Specialty Exam  Presentation General Appearance:  Disheveled; Other (comment) (malodorous)  Eye Contact: Minimal  Speech: Slurred  Speech Volume: Decreased  Handedness: Right   Mood and Affect  Mood: Dysphoric; Depressed  Affect: Flat   Thought Process  Thought Processes: Coherent  Descriptions of Associations:Intact  Orientation:Other (comment) (oriented to situation, place, person; oriented to month; states he does not know year)  Thought Content:WDL  Diagnosis of Schizophrenia or Schizoaffective disorder in past: No   Hallucinations:Hallucinations: None  Ideas of Reference:None  Suicidal Thoughts:Suicidal Thoughts: Yes,  Passive SI Passive Intent and/or Plan: Without Intent; Without Plan  Homicidal Thoughts:Homicidal Thoughts: No   Sensorium  Memory: Immediate Poor  Judgment: Impaired  Insight: Shallow   Executive Functions  Concentration: Poor  Attention Span: Poor  Recall: Poor  Fund of Knowledge: Fair  Language: Fair   Psychomotor Activity  Psychomotor Activity: Psychomotor Activity: Tremor   Assets  Assets: Communication Skills; Desire for Improvement; Resilience   Sleep  Sleep: Sleep: Poor   No data recorded  Physical Exam Constitutional:      General: He is not in acute distress. Eyes:     General: No scleral icterus. Cardiovascular:     Rate and Rhythm: Normal rate.  Pulmonary:     Effort: Pulmonary effort is normal. No respiratory distress.  Neurological:     Comments: Oriented to situation, place, person; oriented to month; states he does not know year  Psychiatric:        Attention and Perception: Perception normal. He is inattentive.        Mood and Affect: Mood is depressed. Affect is flat.        Speech: Speech is slurred.        Behavior: Behavior is slowed. Behavior is cooperative.        Thought Content: Thought content includes suicidal ideation. Thought content does not include suicidal plan.    Review of Systems  Constitutional:  Negative for chills and fever.  Respiratory:  Negative for shortness of breath.   Cardiovascular:  Negative for chest pain and palpitations.  Gastrointestinal:  Positive for nausea. Negative for abdominal pain.  Neurological:  Positive for tremors. Negative for headaches.  Psychiatric/Behavioral:  Positive for depression, substance abuse and suicidal ideas.     Blood pressure (!) 133/110, pulse 95, temperature 98.3 F (36.8 C), temperature source Oral, resp. rate 18, SpO2 100 %. There is no height or weight on file to calculate BMI.  Past Psychiatric History: Depression, alcohol abuse, SI  Is the patient  at risk to self? Yes  Has the patient been a risk to self in the past 6 months? Yes .    Has the patient been a risk to self within the distant past? Yes   Is the patient a risk to others? No   Has the patient been a risk to others in the past 6 months? No   Has the patient been a risk to others within the distant past? No   Past Medical History:  Past Medical History:  Diagnosis Date   Alcoholic hepatitis    Depression    ETOH abuse    Gastric bleed 08/2018   Hypertension    Pancreatitis    Seizures ([place])    Suicidal behavior     Past Surgical History:  Procedure Laterality Date   BIOPSY  08/05/2018   Procedure: BIOPSY;  Surgeon: Irving Copas., MD;  Location: [facility];  Service: Gastroenterology;;   BIOPSY  08/07/2018   Procedure: BIOPSY;  Surgeon: Thornton Park, MD;  Location[facility];  Service: Gastroenterology;;   BIOPSY  01/02/2019   Procedure: BIOPSY;  Surgeon: Gatha Mayer, MD;  Location: [facility] ENDOSCOPY;  Service: Endoscopy;;   COLONOSCOPY WITH PROPOFOL N/A 08/07/2018  Procedure: COLONOSCOPY WITH PROPOFOL;  Surgeon: Thornton Park, MD;  Location: [place];  Service: Gastroenterology;  Laterality: N/A;   ESOPHAGOGASTRODUODENOSCOPY N/A 01/02/2019   Procedure: ESOPHAGOGASTRODUODENOSCOPY (EGD);  Surgeon: Gatha Mayer, MD;  Location[facility];  Service: Endoscopy;  Laterality: N/A;   ESOPHAGOGASTRODUODENOSCOPY N/A 07/03/2019   Procedure: ESOPHAGOGASTRODUODENOSCOPY (EGD);  Surgeon: Wonda Horner, MD;  Location: [facility];  Service: Endoscopy;  Laterality: N/A;   ESOPHAGOGASTRODUODENOSCOPY N/A 06/26/2020   Procedure: ESOPHAGOGASTRODUODENOSCOPY (EGD);  Surgeon: Arta Silence, MD;  Location: [facility];  Service: Endoscopy;  Laterality: N/A;   ESOPHAGOGASTRODUODENOSCOPY (EGD) WITH PROPOFOL N/A 11/02/2016   Procedure: ESOPHAGOGASTRODUODENOSCOPY (EGD) WITH PROPOFOL;  Surgeon: Carol Ada, MD;  Location[facility];  Service: Endoscopy;   Laterality: N/A;   ESOPHAGOGASTRODUODENOSCOPY (EGD) WITH PROPOFOL N/A 08/05/2018   Procedure: ESOPHAGOGASTRODUODENOSCOPY (EGD) WITH PROPOFOL;  Surgeon: Rush Landmark Telford Nab., MD;  Location: [place];  Service: Gastroenterology;  Laterality: N/A;   ESOPHAGOGASTRODUODENOSCOPY (EGD) WITH PROPOFOL N/A 07/14/2019   Procedure: ESOPHAGOGASTRODUODENOSCOPY (EGD) WITH PROPOFOL;  Surgeon: Otis Brace, MD;  Location: [facility]OS[facility];  Service: Gastroenterology;  Laterality: N/A;   HERNIA REPAIR     LEG SURGERY     POLYPECTOMY  08/07/2018   Procedure: POLYPECTOMY;  Surgeon: Thornton Park, MD;  Location: [facility] ENDOSCOPY;  Service: Gastroenterology;;    Family History:  Family History  Problem Relation Age of Onset   Diabetes Mother    Alcoholism Mother    Emphysema Father    Lung cancer Father    Alcoholism Father     Social History:  Social History   Socioeconomic History   Marital status: Divorced    Spouse name: Not on file   Number of children: Not on file   Years of education: Not on file   Highest education level: Not on file  Occupational History   Not on file  Tobacco Use   Smoking status: Every Day    Packs/day: 1.00    Types: Cigarettes   Smokeless tobacco: Never  Vaping Use   Vaping Use: Never used  Substance and Sexual Activity   Alcohol use: Yes    Comment: 5+ BEERS DAILY, Patient does not know how much he drank tonight   Drug use: Not Currently    Frequency: 3.0 times per week    Types: Cocaine   Sexual activity: Not Currently  Other Topics Concern   Not on file  Social History Narrative   Not on file   Social Determinants of Health   Financial Resource Strain: Not on file  Food Insecurity: Not on file  Transportation Needs: Not on file  Physical Activity: Not on file  Stress: Not on file  Social Connections: Not on file  Intimate Partner Violence: Not on file    SDOH:  SDOH Screenings   Alcohol Screen: High Risk (07/16/2021)  Depression  (PHQ2-9): High Risk (08/08/2021)  Tobacco Use: High Risk (01/19/2022)    Last Labs:  Admission on 01/19/2022  Component Date Value Ref Range Status   WBC 01/19/2022 4.8  4.0 - 10.5 K/uL Final   RBC 01/19/2022 3.95 (L)  4.22 - 5.81 MIL/uL Final   Hemoglobin 01/19/2022 10.8 (L)  13.0 - 17.0 g/dL Final   HCT 01/19/2022 33.2 (L)  39.0 - 52.0 % Final   MCV 01/19/2022 84.1  80.0 - 100.0 fL Final   MCH 01/19/2022 27.3  26.0 - 34.0 pg Final   MCHC 01/19/2022 32.5  30.0 - 36.0 g/dL Final   RDW 01/19/2022 16.7 (H)  11.5 - 15.5 % Final   Platelets 01/19/2022 161  150 - 400 K/uL Final   nRBC 01/19/2022 0.0  0.0 - 0.2 % Final   Neutrophils Relative % 01/19/2022 40  % Final   Neutro Abs 01/19/2022 2.0  1.7 - 7.7 K/uL Final   Lymphocytes Relative 01/19/2022 38  % Final   Lymphs Abs 01/19/2022 1.8  0.7 - 4.0 K/uL Final   Monocytes Relative 01/19/2022 17  % Final   Monocytes Absolute 01/19/2022 0.8  0.1 - 1.0 K/uL Final   Eosinophils Relative 01/19/2022 3  % Final   Eosinophils Absolute 01/19/2022 0.1  0.0 - 0.5 K/uL Final   Basophils Relative 01/19/2022 2  % Final   Basophils Absolute 01/19/2022 0.1  0.0 - 0.1 K/uL Final   Immature Granulocytes 01/19/2022 0  % Final   Abs Immature Granulocytes 01/19/2022 0.02  0.00 - 0.07 K/uL Final   Performed at [facility], [place] [address], [place], [place] [address]   Sodium 01/19/2022 138  135 - 145 mmol/L Final   Potassium 01/19/2022 3.8  3.5 - 5.1 mmol/L Final   Chloride 01/19/2022 102  98 - 111 mmol/L Final   CO2 01/19/2022 22  22 - 32 mmol/L Final   Glucose, Bld 01/19/2022 89  70 - 99 mg/dL Final   Glucose reference range applies only to samples taken after fasting for at least 8 hours.   BUN 01/19/2022 7  6 - 20 mg/dL Final   Creatinine, Ser 01/19/2022 0.64  0.61 - 1.24 mg/dL Final   Calcium 01/19/2022 9.2  8.9 - 10.3 mg/dL Final   Total Protein 01/19/2022 8.1  6.5 - 8.1 g/dL Final   Albumin 01/19/2022 4.0  3.5 - 5.0 g/dL Final   AST  01/19/2022 140 (H)  15 - 41 U/L Final   ALT 01/19/2022 68 (H)  0 - 44 U/L Final   Alkaline Phosphatase 01/19/2022 144 (H)  38 - 126 U/L Final   Total Bilirubin 01/19/2022 0.4  0.3 - 1.2 mg/dL Final   GFR, Estimated 01/19/2022 >60  >60 mL/min Final   Comment: (NOTE) Calculated using the CKD-EPI Creatinine Equation (2021)    Anion gap 01/19/2022 14  5 - 15 Final   Performed at [facility] [address], [place], [place] [address]   Hgb A1c MFr Bld 01/19/2022 5.3  4.8 - 5.6 % Final   Comment: (NOTE) Pre diabetes:          5.7%-6.4%  Diabetes:              >6.4%  Glycemic control for   <7.0% adults with diabetes    Mean Plasma Glucose 01/19/2022 105.41  mg/dL Final   Performed at [facility], [place] [address], [place], [place] [address]   Alcohol, Ethyl (B) 01/19/2022 290 (H)  <10 mg/dL Final   Comment: (NOTE) Lowest detectable limit for serum alcohol is 10 mg/dL.  For medical purposes only. Performed at [facility], [place] [address], [place], [place] [address]    TSH 01/19/2022 0.415  0.350 - 4.500 uIU/mL Final   Comment: Performed by a 3rd Generation assay with a functional sensitivity of <=0.01 uIU/mL. Performed at [facility], [place] [address], [place], [place] [address]    POC Amphetamine UR 01/19/2022 None Detected  NONE DETECTED (Cut Off Level 1000 ng/mL) Final   POC Secobarbital (BAR) 01/19/2022 None Detected  NONE DETECTED (Cut Off Level 300 ng/mL) Final  POC Buprenorphine (BUP) 01/19/2022 None Detected  NONE DETECTED (Cut Off Level 10 ng/mL) Final   POC Oxazepam (BZO) 01/19/2022 None Detected  NONE DETECTED (Cut Off Level 300 ng/mL) Final   POC Cocaine UR 01/19/2022 None Detected  NONE DETECTED (Cut Off Level 300 ng/mL) Final   POC Methamphetamine UR 01/19/2022 None Detected  NONE DETECTED (Cut Off Level 1000 ng/mL) Final   POC Morphine 01/19/2022 None Detected  NONE DETECTED (Cut Off Level 300 ng/mL) Final   POC Methadone UR 01/19/2022 None  Detected  NONE DETECTED (Cut Off Level 300 ng/mL) Final   POC Oxycodone UR 01/19/2022 None Detected  NONE DETECTED (Cut Off Level 100 ng/mL) Final   POC Marijuana UR 01/19/2022 None Detected  NONE DETECTED (Cut Off Level 50 ng/mL) Final   SARSCOV2ONAVIRUS 2 AG 01/19/2022 NEGATIVE  NEGATIVE Final   Comment: (NOTE) SARS-CoV-2 antigen NOT DETECTED.   Negative results are presumptive.  Negative results do not preclude SARS-CoV-2 infection and should not be used as the sole basis for treatment or other patient management decisions, including infection  control decisions, particularly in the presence of clinical signs and  symptoms consistent with COVID-19, or in those who have been in contact with the virus.  Negative results must be combined with clinical observations, patient history, and epidemiological information. The expected result is Negative.  Fact Sheet for Patients: HandmadeRecipes.com.cy  Fact Sheet for Healthcare Providers: FuneralLife.at  This test is not yet approved or cleared by the [place] FDA and  has been authorized for detection and/or diagnosis of SARS-CoV-2 by FDA under an Emergency Use Authorization (EUA).  This EUA will remain in effect (meaning this test can be used) for the duration of  the COV                          ID-19 declaration under Section 564(b)(1) of the Act, 21 U.S.C. section 360bbb-3(b)(1), unless the authorization is terminated or revoked sooner.    Admission on 01/11/2022, Discharged on 01/12/2022  Component Date Value Ref Range Status   WBC 01/11/2022 6.0  4.0 - 10.5 K/uL Final   RBC 01/11/2022 3.96 (L)  4.22 - 5.81 MIL/uL Final   Hemoglobin 01/11/2022 11.1 (L)  13.0 - 17.0 g/dL Final   HCT 01/11/2022 33.1 (L)  39.0 - 52.0 % Final   MCV 01/11/2022 83.6  80.0 - 100.0 fL Final   MCH 01/11/2022 28.0  26.0 - 34.0 pg Final   MCHC 01/11/2022 33.5  30.0 - 36.0 g/dL Final   RDW 01/11/2022 17.0  (H)  11.5 - 15.5 % Final   Platelets 01/11/2022 115 (L)  150 - 400 K/uL Final   nRBC 01/11/2022 0.0  0.0 - 0.2 % Final   Neutrophils Relative % 01/11/2022 54  % Final   Neutro Abs 01/11/2022 3.3  1.7 - 7.7 K/uL Final   Lymphocytes Relative 01/11/2022 29  % Final   Lymphs Abs 01/11/2022 1.7  0.7 - 4.0 K/uL Final   Monocytes Relative 01/11/2022 14  % Final   Monocytes Absolute 01/11/2022 0.8  0.1 - 1.0 K/uL Final   Eosinophils Relative 01/11/2022 1  % Final   Eosinophils Absolute 01/11/2022 0.1  0.0 - 0.5 K/uL Final   Basophils Relative 01/11/2022 2  % Final   Basophils Absolute 01/11/2022 0.1  0.0 - 0.1 K/uL Final   Immature Granulocytes 01/11/2022 0  % Final   Abs Immature Granulocytes 01/11/2022 0.02  0.00 - 0.07 K/uL  Final   Performed at [facility], [place] [address], [place], [place] [address]   Sodium 01/11/2022 136  135 - 145 mmol/L Final   Potassium 01/11/2022 3.7  3.5 - 5.1 mmol/L Final   Chloride 01/11/2022 101  98 - 111 mmol/L Final   CO2 01/11/2022 21 (L)  22 - 32 mmol/L Final   Glucose, Bld 01/11/2022 106 (H)  70 - 99 mg/dL Final   Glucose reference range applies only to samples taken after fasting for at least 8 hours.   BUN 01/11/2022 5 (L)  6 - 20 mg/dL Final   Creatinine, Ser 01/11/2022 0.66  0.61 - 1.24 mg/dL Final   Calcium 01/11/2022 8.5 (L)  8.9 - 10.3 mg/dL Final   Total Protein 01/11/2022 8.0  6.5 - 8.1 g/dL Final   Albumin 01/11/2022 4.1  3.5 - 5.0 g/dL Final   AST 01/11/2022 149 (H)  15 - 41 U/L Final   ALT 01/11/2022 63 (H)  0 - 44 U/L Final   Alkaline Phosphatase 01/11/2022 131 (H)  38 - 126 U/L Final   Total Bilirubin 01/11/2022 0.7  0.3 - 1.2 mg/dL Final   GFR, Estimated 01/11/2022 >60  >60 mL/min Final   Comment: (NOTE) Calculated using the CKD-EPI Creatinine Equation (2021)    Anion gap 01/11/2022 14  5 - 15 Final   Performed at [facility] [address], [place], [place] [address]   Alcohol, Ethyl (B) 01/11/2022 351 (HH)  <10 mg/dL  Final   Comment: CRITICAL RESULT CALLED TO, READ BACK BY AND VERIFIED WITH B. SANGALANG RN, 0014, 01/12/22, EADEDOKUN (NOTE) Lowest detectable limit for serum alcohol is 10 mg/dL.  For medical purposes only. Performed at [facility], [place] [address], [place], [place] [address]   Admission on 12/08/2021, Discharged on 12/09/2021  Component Date Value Ref Range Status   Sodium 12/08/2021 140  135 - 145 mmol/L Final   Potassium 12/08/2021 3.8  3.5 - 5.1 mmol/L Final   Chloride 12/08/2021 103  98 - 111 mmol/L Final   CO2 12/08/2021 24  22 - 32 mmol/L Final   Glucose, Bld 12/08/2021 93  70 - 99 mg/dL Final   Glucose reference range applies only to samples taken after fasting for at least 8 hours.   BUN 12/08/2021 5 (L)  6 - 20 mg/dL Final   Creatinine, Ser 12/08/2021 0.66  0.61 - 1.24 mg/dL Final   Calcium 12/08/2021 8.7 (L)  8.9 - 10.3 mg/dL Final   Total Protein 12/08/2021 8.0  6.5 - 8.1 g/dL Final   Albumin 12/08/2021 4.2  3.5 - 5.0 g/dL Final   AST 12/08/2021 176 (H)  15 - 41 U/L Final   ALT 12/08/2021 75 (H)  0 - 44 U/L Final   Alkaline Phosphatase 12/08/2021 152 (H)  38 - 126 U/L Final   Total Bilirubin 12/08/2021 0.7  0.3 - 1.2 mg/dL Final   GFR, Estimated 12/08/2021 >60  >60 mL/min Final   Comment: (NOTE) Calculated using the CKD-EPI Creatinine Equation (2021)    Anion gap 12/08/2021 13  5 - 15 Final   Performed at [facility], [place] [address], [place], [place] [address]   Alcohol, Ethyl (B) 12/08/2021 372 (HH)  <10 mg/dL Final   Comment: CRITICAL RESULT CALLED TO, READ BACK BY AND VERIFIED WITH RIVERS,C AT 2216 ON 12/08/21 BY [email or web address] (NOTE) Lowest detectable limit for serum alcohol is 10 mg/dL.  For medical purposes only. Performed at  [facility], [place] [address], [place], [place] [address]    Salicylate Lvl 63/03/6008 <7.0 (L)  7.0 - 30.0 mg/dL Final   Performed at [facility] [address]  [place], [place] [address]   Acetaminophen (Tylenol), Serum 12/08/2021 <10 (L)  10 - 30 ug/mL Final   Comment: (NOTE) Therapeutic concentrations vary significantly. A range of 10-30 ug/mL  may be an effective concentration for many patients. However, some  are best treated at concentrations outside of this range. Acetaminophen concentrations >150 ug/mL at 4 hours after ingestion  and >50 ug/mL at 12 hours after ingestion are often associated with  toxic reactions.  Performed at [facility], [place] [address], [place], [place] [address]    WBC 12/08/2021 4.0  4.0 - 10.5 K/uL Final   RBC 12/08/2021 4.26  4.22 - 5.81 MIL/uL Final   Hemoglobin 12/08/2021 11.6 (L)  13.0 - 17.0 g/dL Final   HCT 12/08/2021 36.1 (L)  39.0 - 52.0 % Final   MCV 12/08/2021 84.7  80.0 - 100.0 fL Final   MCH 12/08/2021 27.2  26.0 - 34.0 pg Final   MCHC 12/08/2021 32.1  30.0 - 36.0 g/dL Final   RDW 12/08/2021 17.2 (H)  11.5 - 15.5 % Final   Platelets 12/08/2021 120 (L)  150 - 400 K/uL Final   nRBC 12/08/2021 0.0  0.0 - 0.2 % Final   Performed at [facility], [place] [address] [place], [place] [address]   Opiates 12/08/2021 NONE DETECTED  NONE DETECTED Final   Cocaine 12/08/2021 NONE DETECTED  NONE DETECTED Final   Benzodiazepines 12/08/2021 NONE DETECTED  NONE DETECTED Final   Amphetamines 12/08/2021 NONE DETECTED  NONE DETECTED Final   Tetrahydrocannabinol 12/08/2021 NONE DETECTED  NONE DETECTED Final   Barbiturates 12/08/2021 NONE DETECTED  NONE DETECTED Final   Comment: (NOTE) DRUG SCREEN FOR MEDICAL PURPOSES ONLY.  IF CONFIRMATION IS NEEDED FOR ANY PURPOSE, NOTIFY LAB WITHIN 5 DAYS.  LOWEST DETECTABLE LIMITS FOR URINE DRUG SCREEN Drug Class                     Cutoff (ng/mL) Amphetamine and metabolites    1000 Barbiturate and metabolites    200 Benzodiazepine                 706 Tricyclics and metabolites     300 Opiates and metabolites        300 Cocaine and metabolites         300 THC                            50 Performed at [facility], [place] [address] [place], [place] [address]   Admission on 12/06/2021, Discharged on 12/07/2021  Component Date Value Ref Range Status   Sodium 12/06/2021 138  135 - 145 mmol/L Final   Potassium 12/06/2021 3.6  3.5 - 5.1 mmol/L Final   Chloride 12/06/2021 103  98 - 111 mmol/L Final   CO2 12/06/2021 26  22 - 32 mmol/L Final   Glucose, Bld 12/06/2021 94  70 - 99 mg/dL Final   Glucose reference range applies only to samples taken after fasting for at least 8 hours.   BUN 12/06/2021 <5 (L)  6 - 20 mg/dL Final   Creatinine, Ser 12/06/2021 0.77  0.61 - 1.24 mg/dL Final   Calcium 12/06/2021 9.0  8.9 - 10.3 mg/dL  Final   Total Protein 12/06/2021 8.2 (H)  6.5 - 8.1 g/dL Final   Albumin 12/06/2021 4.1  3.5 - 5.0 g/dL Final   AST 12/06/2021 149 (H)  15 - 41 U/L Final   ALT 12/06/2021 66 (H)  0 - 44 U/L Final   Alkaline Phosphatase 12/06/2021 147 (H)  38 - 126 U/L Final   Total Bilirubin 12/06/2021 0.6  0.3 - 1.2 mg/dL Final   GFR, Estimated 12/06/2021 >60  >60 mL/min Final   Comment: (NOTE) Calculated using the CKD-EPI Creatinine Equation (2021)    Anion gap 12/06/2021 9  5 - 15 Final   Performed at [facility], [place] [address], [place], [place] [address]   Alcohol, Ethyl (B) 12/06/2021 380 (HH)  <10 mg/dL Final   Comment: CRITICAL RESULT CALLED TO, READ BACK BY AND VERIFIED WITH GESELL, K @ 2003 [id number] JMK (NOTE) Lowest detectable limit for serum alcohol is 10 mg/dL.  For medical purposes only. Performed at [facility], [place] [address] [place], [place] [address]    WBC 12/06/2021 4.8  4.0 - 10.5 K/uL Final   RBC 12/06/2021 4.41  4.22 - 5.81 MIL/uL Final   Hemoglobin 12/06/2021 12.1 (L)  13.0 - 17.0 g/dL Final   HCT 12/06/2021 37.9 (L)  39.0 - 52.0 % Final   MCV 12/06/2021 85.9  80.0 - 100.0 fL Final   MCH 12/06/2021 27.4  26.0 - 34.0 pg Final   MCHC  12/06/2021 31.9  30.0 - 36.0 g/dL Final   RDW 12/06/2021 17.2 (H)  11.5 - 15.5 % Final   Platelets 12/06/2021 114 (L)  150 - 400 K/uL Final   nRBC 12/06/2021 0.0  0.0 - 0.2 % Final   Performed at [facility], [place] [address], [place], [place] [address]   Magnesium 12/06/2021 2.2  1.7 - 2.4 mg/dL Final   Performed at [facility] [address] [place], [place] [address]  Admission on 11/18/2021, Discharged on 11/18/2021  Component Date Value Ref Range Status   WBC 11/18/2021 4.5  4.0 - 10.5 K/uL Final   RBC 11/18/2021 4.24  4.22 - 5.81 MIL/uL Final   Hemoglobin 11/18/2021 11.7 (L)  13.0 - 17.0 g/dL Final   HCT 11/18/2021 36.1 (L)  39.0 - 52.0 % Final   MCV 11/18/2021 85.1  80.0 - 100.0 fL Final   MCH 11/18/2021 27.6  26.0 - 34.0 pg Final   MCHC 11/18/2021 32.4  30.0 - 36.0 g/dL Final   RDW 11/18/2021 16.4 (H)  11.5 - 15.5 % Final   Platelets 11/18/2021 107 (L)  150 - 400 K/uL Final   nRBC 11/18/2021 0.0  0.0 - 0.2 % Final   Neutrophils Relative % 11/18/2021 43  % Final   Neutro Abs 11/18/2021 2.0  1.7 - 7.7 K/uL Final   Lymphocytes Relative 11/18/2021 40  % Final   Lymphs Abs 11/18/2021 1.8  0.7 - 4.0 K/uL Final   Monocytes Relative 11/18/2021 12  % Final   Monocytes Absolute 11/18/2021 0.5  0.1 - 1.0 K/uL Final   Eosinophils Relative 11/18/2021 3  % Final   Eosinophils Absolute 11/18/2021 0.1  0.0 - 0.5 K/uL Final   Basophils Relative 11/18/2021 2  % Final   Basophils Absolute 11/18/2021 0.1  0.0 - 0.1 K/uL Final   Immature Granulocytes 11/18/2021 0  % Final   Abs Immature Granulocytes 11/18/2021 0.01  0.00 - 0.07 K/uL Final   Performed at [facility], [place] [address] [place], [place] [address]   Sodium 11/18/2021 140  135 - 145 mmol/L Final   Potassium 11/18/2021 3.5  3.5 - 5.1 mmol/L Final   Chloride 11/18/2021 108  98 - 111 mmol/L Final   CO2 11/18/2021 21 (L)  22 - 32 mmol/L Final   Glucose, Bld 11/18/2021 90  70 - 99 mg/dL Final    Glucose reference range applies only to samples taken after fasting for at least 8 hours.   BUN 11/18/2021 <5 (L)  6 - 20 mg/dL Final   Creatinine, Ser 11/18/2021 0.71  0.61 - 1.24 mg/dL Final   Calcium 11/18/2021 8.4 (L)  8.9 - 10.3 mg/dL Final   Total Protein 11/18/2021 7.4  6.5 - 8.1 g/dL Final   Albumin 11/18/2021 3.7  3.5 - 5.0 g/dL Final   AST 11/18/2021 91 (H)  15 - 41 U/L Final   ALT 11/18/2021 48 (H)  0 - 44 U/L Final   Alkaline Phosphatase 11/18/2021 178 (H)  38 - 126 U/L Final   Total Bilirubin 11/18/2021 0.5  0.3 - 1.2 mg/dL Final   GFR, Estimated 11/18/2021 >60  >60 mL/min Final   Comment: (NOTE) Calculated using the CKD-EPI Creatinine Equation (2021)    Anion gap 11/18/2021 11  5 - 15 Final   Performed at [facility], [place] [address] [place], [place] [address]   Alcohol, Ethyl (B) 11/18/2021 336 (HH)  <10 mg/dL Final   Comment: CRITICAL RESULT CALLED TO, READ BACK BY AND VERIFIED WITH DESMOND JAY,RN AT 1455 11/18/2021 BY ZBEECH. (NOTE) Lowest detectable limit for serum alcohol is 10 mg/dL.  For medical purposes only. Performed at [facility], [place] [address], [place], [place] [address]    Glucose-Capillary 11/18/2021 98  70 - 99 mg/dL Final   Glucose reference range applies only to samples taken after fasting for at least 8 hours.   Comment 1 11/18/2021 Notify RN   Final   Comment 2 11/18/2021 Document in Chart   Final  Admission on 11/16/2021, Discharged on 11/16/2021  Component Date Value Ref Range Status   WBC 11/16/2021 4.1  4.0 - 10.5 K/uL Final   RBC 11/16/2021 4.20 (L)  4.22 - 5.81 MIL/uL Final   Hemoglobin 11/16/2021 11.5 (L)  13.0 - 17.0 g/dL Final   HCT 11/16/2021 35.3 (L)  39.0 - 52.0 % Final   MCV 11/16/2021 84.0  80.0 - 100.0 fL Final   MCH 11/16/2021 27.4  26.0 - 34.0 pg Final   MCHC 11/16/2021 32.6  30.0 - 36.0 g/dL Final   RDW 11/16/2021 16.3 (H)  11.5 - 15.5 % Final   Platelets 11/16/2021 127 (L)  150 - 400 K/uL Final   nRBC  11/16/2021 0.0  0.0 - 0.2 % Final   Neutrophils Relative % 11/16/2021 38  % Final   Neutro Abs 11/16/2021 1.5 (L)  1.7 - 7.7 K/uL Final   Lymphocytes Relative 11/16/2021 45  % Final   Lymphs Abs 11/16/2021 1.9  0.7 - 4.0 K/uL Final   Monocytes Relative 11/16/2021 12  % Final   Monocytes Absolute 11/16/2021 0.5  0.1 - 1.0 K/uL Final   Eosinophils Relative 11/16/2021 3  % Final   Eosinophils Absolute 11/16/2021 0.1  0.0 - 0.5 K/uL Final   Basophils Relative 11/16/2021 2  % Final   Basophils Absolute 11/16/2021 0.1  0.0 - 0.1 K/uL Final   Immature Granulocytes 11/16/2021 0  % Final   Abs Immature  Granulocytes 11/16/2021 0.01  0.00 - 0.07 K/uL Final   Performed at [place] [address], [place], [place] [address]   Sodium 11/16/2021 140  135 - 145 mmol/L Final   Potassium 11/16/2021 3.7  3.5 - 5.1 mmol/L Final   Chloride 11/16/2021 107  98 - 111 mmol/L Final   CO2 11/16/2021 22  22 - 32 mmol/L Final   Glucose, Bld 11/16/2021 93  70 - 99 mg/dL Final   Glucose reference range applies only to samples taken after fasting for at least 8 hours.   BUN 11/16/2021 <5 (L)  6 - 20 mg/dL Final   Creatinine, Ser 11/16/2021 0.77  0.61 - 1.24 mg/dL Final   Calcium 11/16/2021 8.8 (L)  8.9 - 10.3 mg/dL Final   Total Protein 11/16/2021 7.8  6.5 - 8.1 g/dL Final   Albumin 11/16/2021 4.0  3.5 - 5.0 g/dL Final   AST 11/16/2021 103 (H)  15 - 41 U/L Final   ALT 11/16/2021 52 (H)  0 - 44 U/L Final   Alkaline Phosphatase 11/16/2021 197 (H)  38 - 126 U/L Final   Total Bilirubin 11/16/2021 0.4  0.3 - 1.2 mg/dL Final   GFR, Estimated 11/16/2021 >60  >60 mL/min Final   Comment: (NOTE) Calculated using the CKD-EPI Creatinine Equation (2021)    Anion gap 11/16/2021 11  5 - 15 Final   Performed at [facility], [place] [address], [place], [place] [address]  Admission on 11/08/2021, Discharged on 11/09/2021  Component Date Value Ref Range Status   Glucose-Capillary 11/08/2021 90  70 - 99 mg/dL Final    Glucose reference range applies only to samples taken after fasting for at least 8 hours.  Admission on 11/02/2021, Discharged on 11/04/2021  Component Date Value Ref Range Status   Sodium 11/02/2021 139  135 - 145 mmol/L Final   Potassium 11/02/2021 4.1  3.5 - 5.1 mmol/L Final   Chloride 11/02/2021 108  98 - 111 mmol/L Final   CO2 11/02/2021 23  22 - 32 mmol/L Final   Glucose, Bld 11/02/2021 97  70 - 99 mg/dL Final   Glucose reference range applies only to samples taken after fasting for at least 8 hours.   BUN 11/02/2021 8  6 - 20 mg/dL Final   Creatinine, Ser 11/02/2021 0.66  0.61 - 1.24 mg/dL Final   Calcium 11/02/2021 8.5 (L)  8.9 - 10.3 mg/dL Final   Total Protein 11/02/2021 7.7  6.5 - 8.1 g/dL Final   Albumin 11/02/2021 3.8  3.5 - 5.0 g/dL Final   AST 11/02/2021 110 (H)  15 - 41 U/L Final   ALT 11/02/2021 64 (H)  0 - 44 U/L Final   Alkaline Phosphatase 11/02/2021 190 (H)  38 - 126 U/L Final   Total Bilirubin 11/02/2021 0.5  0.3 - 1.2 mg/dL Final   GFR, Estimated 11/02/2021 >60  >60 mL/min Final   Comment: (NOTE) Calculated using the CKD-EPI Creatinine Equation (2021)    Anion gap 11/02/2021 8  5 - 15 Final   Performed at [facility], [place] [address], [place], [place] [address]   Alcohol, Ethyl (B) 11/02/2021 303 (HH)  <10 mg/dL Final   Comment: Marina Gravel rn @ 2132 11/02/2021 ENGL[place], K (NOTE) Lowest detectable limit for serum alcohol is 10 mg/dL.  For medical purposes only. Performed at [facility], [place] [address] [place], [place] [address]    Salicylate Lvl 25/42/7062 <7.0 (L)  7.0 -  30.0 mg/dL Final   Performed at [place] [address] [place], [place] [address]   Acetaminophen (Tylenol), Serum 11/02/2021 <10 (L)  10 - 30 ug/mL Final   Comment: (NOTE) Therapeutic concentrations vary significantly. A range of 10-30 ug/mL  may be an effective concentration for many patients. However, some  are  best treated at concentrations outside of this range. Acetaminophen concentrations >150 ug/mL at 4 hours after ingestion  and >50 ug/mL at 12 hours after ingestion are often associated with  toxic reactions.  Performed at [facility], [place] [address] [place], [place] [address]    WBC 11/02/2021 6.3  4.0 - 10.5 K/uL Final   RBC 11/02/2021 3.61 (L)  4.22 - 5.81 MIL/uL Final   Hemoglobin 11/02/2021 10.0 (L)  13.0 - 17.0 g/dL Final   HCT 11/02/2021 31.4 (L)  39.0 - 52.0 % Final   MCV 11/02/2021 87.0  80.0 - 100.0 fL Final   MCH 11/02/2021 27.7  26.0 - 34.0 pg Final   MCHC 11/02/2021 31.8  30.0 - 36.0 g/dL Final   RDW 11/02/2021 18.4 (H)  11.5 - 15.5 % Final   Platelets 11/02/2021 218  150 - 400 K/uL Final   nRBC 11/02/2021 0.0  0.0 - 0.2 % Final   Performed at [facility], [place] [address] [place], [place] [address]   Opiates 11/02/2021 NONE DETECTED  NONE DETECTED Final   Cocaine 11/02/2021 NONE DETECTED  NONE DETECTED Final   Benzodiazepines 11/02/2021 NONE DETECTED  NONE DETECTED Final   Amphetamines 11/02/2021 NONE DETECTED  NONE DETECTED Final   Tetrahydrocannabinol 11/02/2021 NONE DETECTED  NONE DETECTED Final   Barbiturates 11/02/2021 POSITIVE (A)  NONE DETECTED Final   Comment: (NOTE) DRUG SCREEN FOR MEDICAL PURPOSES ONLY.  IF CONFIRMATION IS NEEDED FOR ANY PURPOSE, NOTIFY LAB WITHIN 5 DAYS.  LOWEST DETECTABLE LIMITS FOR URINE DRUG SCREEN Drug Class                     Cutoff (ng/mL) Amphetamine and metabolites    1000 Barbiturate and metabolites    200 Benzodiazepine                 528 Tricyclics and metabolites     300 Opiates and metabolites        300 Cocaine and metabolites        300 THC                            50 Performed at [facility], [place] [address] [place], [place] [address]   Admission on 10/25/2021, Discharged on 10/28/2021  Component Date Value Ref Range Status   Sodium 10/25/2021 136  135  - 145 mmol/L Final   Potassium 10/25/2021 3.3 (L)  3.5 - 5.1 mmol/L Final   Chloride 10/25/2021 102  98 - 111 mmol/L Final   CO2 10/25/2021 24  22 - 32 mmol/L Final   Glucose, Bld 10/25/2021 102 (H)  70 - 99 mg/dL Final   Glucose reference range applies only to samples taken after fasting for at least 8 hours.   BUN 10/25/2021 <5 (L)  6 - 20 mg/dL Final   Creatinine, Ser 10/25/2021 0.73  0.61 - 1.24 mg/dL Final   Calcium 10/25/2021 9.1  8.9 - 10.3 mg/dL Final   Total Protein 10/25/2021 7.5  6.5 - 8.1 g/dL Final   Albumin 10/25/2021 3.9  3.5 -  5.0 g/dL Final   AST 10/25/2021 194 (H)  15 - 41 U/L Final   ALT 10/25/2021 62 (H)  0 - 44 U/L Final   Alkaline Phosphatase 10/25/2021 166 (H)  38 - 126 U/L Final   Total Bilirubin 10/25/2021 0.9  0.3 - 1.2 mg/dL Final   GFR, Estimated 10/25/2021 >60  >60 mL/min Final   Comment: (NOTE) Calculated using the CKD-EPI Creatinine Equation (2021)    Anion gap 10/25/2021 10  5 - 15 Final   Performed at [facility], [place] [address], [place], [place] [address]   Alcohol, Ethyl (B) 10/25/2021 359 (HH)  <10 mg/dL Final   Comment: CRITICAL RESULT CALLED TO, READ BACK BY AND VERIFIED WITH Elige Radon, RN, 2251, 10/25/21, EADEDOKUN (NOTE) Lowest detectable limit for serum alcohol is 10 mg/dL.  For medical purposes only. Performed at [facility], [place] [address], [place], [place] [address]    WBC 10/25/2021 5.8  4.0 - 10.5 K/uL Final   RBC 10/25/2021 3.72 (L)  4.22 - 5.81 MIL/uL Final   Hemoglobin 10/25/2021 10.3 (L)  13.0 - 17.0 g/dL Final   HCT 10/25/2021 31.5 (L)  39.0 - 52.0 % Final   MCV 10/25/2021 84.7  80.0 - 100.0 fL Final   MCH 10/25/2021 27.7  26.0 - 34.0 pg Final   MCHC 10/25/2021 32.7  30.0 - 36.0 g/dL Final   RDW 10/25/2021 18.0 (H)  11.5 - 15.5 % Final   Platelets 10/25/2021 91 (L)  150 - 400 K/uL Final   REPEATED TO VERIFY   nRBC 10/25/2021 0.0  0.0 - 0.2 % Final   Performed at [facility], [place] [address],  [place], [place] [address]   Opiates 10/25/2021 NONE DETECTED  NONE DETECTED Final   Cocaine 10/25/2021 NONE DETECTED  NONE DETECTED Final   Benzodiazepines 10/25/2021 NONE DETECTED  NONE DETECTED Final   Amphetamines 10/25/2021 NONE DETECTED  NONE DETECTED Final   Tetrahydrocannabinol 10/25/2021 NONE DETECTED  NONE DETECTED Final   Barbiturates 10/25/2021 POSITIVE (A)  NONE DETECTED Final   Comment: (NOTE) DRUG SCREEN FOR MEDICAL PURPOSES ONLY.  IF CONFIRMATION IS NEEDED FOR ANY PURPOSE, NOTIFY LAB WITHIN 5 DAYS.  LOWEST DETECTABLE LIMITS FOR URINE DRUG SCREEN Drug Class                     Cutoff (ng/mL) Amphetamine and metabolites    1000 Barbiturate and metabolites    200 Benzodiazepine                 967 Tricyclics and metabolites     300 Opiates and metabolites        300 Cocaine and metabolites        300 THC                            50 Performed at [facility], [place] [address] [place], [place] [address]    Fecal Occult Bld 10/26/2021 NEGATIVE  NEGATIVE Final  Admission on 10/23/2021, Discharged on 10/23/2021  Component Date Value Ref Range Status   Sodium 10/23/2021 138  135 - 145 mmol/L Final   Potassium 10/23/2021 4.1  3.5 - 5.1 mmol/L Final   Chloride 10/23/2021 103  98 - 111 mmol/L Final   CO2 10/23/2021 25  22 - 32 mmol/L Final   Glucose, Bld 10/23/2021 130 (H)  70 - 99 mg/dL Final   Glucose  reference range applies only to samples taken after fasting for at least 8 hours.   BUN 10/23/2021 7  6 - 20 mg/dL Final   Creatinine, Ser 10/23/2021 0.71  0.61 - 1.24 mg/dL Final   Calcium 10/23/2021 9.0  8.9 - 10.3 mg/dL Final   GFR, Estimated 10/23/2021 >60  >60 mL/min Final   Comment: (NOTE) Calculated using the CKD-EPI Creatinine Equation (2021)    Anion gap 10/23/2021 10  5 - 15 Final   Performed at [facility], [place] [address] [place], [place] [address]   WBC 10/23/2021 7.2  4.0 - 10.5 K/uL Final   RBC 10/23/2021 4.45  4.22 - 5.81 MIL/uL  Final   Hemoglobin 10/23/2021 12.2 (L)  13.0 - 17.0 g/dL Final   HCT 10/23/2021 38.0 (L)  39.0 - 52.0 % Final   MCV 10/23/2021 85.4  80.0 - 100.0 fL Final   MCH 10/23/2021 27.4  26.0 - 34.0 pg Final   MCHC 10/23/2021 32.1  30.0 - 36.0 g/dL Final   RDW 10/23/2021 18.4 (H)  11.5 - 15.5 % Final   Platelets 10/23/2021 96 (L)  150 - 400 K/uL Final   Comment: SPECIMEN CHECKED FOR CLOTS Immature Platelet Fraction may be clinically indicated, consider ordering this additional test [id number] REPEATED TO VERIFY PLATELET COUNT CONFIRMED BY SMEAR    nRBC 10/23/2021 0.0  0.0 - 0.2 % Final   Neutrophils Relative % 10/23/2021 78  % Final   Neutro Abs 10/23/2021 5.5  1.7 - 7.7 K/uL Final   Lymphocytes Relative 10/23/2021 9  % Final   Lymphs Abs 10/23/2021 0.7  0.7 - 4.0 K/uL Final   Monocytes Relative 10/23/2021 12  % Final   Monocytes Absolute 10/23/2021 0.9  0.1 - 1.0 K/uL Final   Eosinophils Relative 10/23/2021 0  % Final   Eosinophils Absolute 10/23/2021 0.0  0.0 - 0.5 K/uL Final   Basophils Relative 10/23/2021 1  % Final   Basophils Absolute 10/23/2021 0.0  0.0 - 0.1 K/uL Final   Immature Granulocytes 10/23/2021 0  % Final   Abs Immature Granulocytes 10/23/2021 0.03  0.00 - 0.07 K/uL Final   Performed at [facility], [place] [address], [place], [place] [address]   Prothrombin Time 10/23/2021 12.8  11.4 - 15.2 seconds Final   INR 10/23/2021 1.0  0.8 - 1.2 Final   Comment: (NOTE) INR goal varies based on device and disease states. Performed at [facility], [place] [address], [place],  [address]    Glucose-Capillary 10/23/2021 111 (H)  70 - 99 mg/dL Final   Glucose reference range applies only to samples taken after fasting for at least 8 hours.  There may be more visits with results that are not included.    Allergies: Tomato and Aspirin  PTA Medications: (Not in a hospital admission)   Medical Decision Making  Admit to [facility]  Voluntary  Lab  Orders         Resp Panel by RT-PCR (Flu A&B, Covid) Anterior Nasal Swab         CBC with Differential/Platelet         Comprehensive metabolic panel         Hemoglobin A1c         Ethanol         Lipid panel         TSH         POCT Urine Drug Screen - (I-Screen)  POC SARS Coronavirus 2 Ag     Meds ordered this encounter  Medications   acetaminophen (TYLENOL) tablet 650 mg   alum & mag hydroxide-simeth (MAALOX/MYLANTA) 200-200-20 MG/5ML suspension 30 mL   magnesium hydroxide (MILK OF MAGNESIA) suspension 30 mL   traZODone (DESYREL) tablet 50 mg   gabapentin (NEURONTIN) capsule 300 mg   thiamine (VITAMIN B1) injection 100 mg   thiamine (VITAMIN B1) tablet 100 mg   multivitamin with minerals tablet 1 tablet   LORazepam (ATIVAN) tablet 1 mg   hydrOXYzine (ATARAX) tablet 25 mg   loperamide (IMODIUM) capsule 2-4 mg   ondansetron (ZOFRAN-ODT) disintegrating tablet 4 mg   FOLLOWED BY Linked Order Group    LORazepam (ATIVAN) tablet 1 mg    LORazepam (ATIVAN) tablet 1 mg    LORazepam (ATIVAN) tablet 1 mg    LORazepam (ATIVAN) tablet 1 mg   Recommendations  Based on my evaluation the patient does not appear to have an emergency medical condition.  Tharon Aquas, NP 01/19/22  5:50 PM
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
## 2022-01-20 ENCOUNTER — Encounter (HOSPITAL_COMMUNITY): Payer: Self-pay

## 2022-01-20 DIAGNOSIS — F10129 Alcohol abuse with intoxication, unspecified: Secondary | ICD-10-CM | POA: Diagnosis not present

## 2022-01-20 DIAGNOSIS — Z5900 Homelessness unspecified: Secondary | ICD-10-CM | POA: Diagnosis not present

## 2022-01-20 DIAGNOSIS — Z1152 Encounter for screening for COVID-19: Secondary | ICD-10-CM | POA: Diagnosis not present

## 2022-01-20 NOTE — ED Notes (Signed)
Pt is in the bed sleeping. Respirations are even and unlabored. No acute distress noted. Will continue to monitor for safety. 

## 2022-01-20 NOTE — Progress Notes (Signed)
Patient was awake and visible on unit.  He ate lunch and was social with select peer.  Patient is calm and cooperative with care.  No complaints or distress.  He has now returned to bed and is resting quietly.  Will monitor and provide safe environment.

## 2022-01-20 NOTE — BHH Group Notes (Signed)
[facility] LCSW Group Therapy   Date/ Time: 01/20/2022 at 1:30pm  Type of Therapy:  Group Therapy  Participation Level:  Active  Participation Quality:  Appropriate  Affect:  Appropriate  Cognitive:  Appropriate  Insight:  Developing/Improving  Engagement in Therapy:  Developing/Improving  Modes of Intervention:  Activity, Discussion, Rapport Building, Socialization and Support  Summary of Progress/Problems: Patient actively participated in group on today. Group started off with introductions and group rules. Group members participated in a therapeutic activity that required active listening and communication skills. Group members were able to identify similarities and differences within the group. Patient interacted positively with staff and peers. No issues to report.   Lucius Conn, LCSW Clinical Social Worker [facility] Ph: [phone]

## 2022-01-20 NOTE — Discharge Instructions (Signed)
[facility] [place], [place], [address] [phone] phone   New Patient Assessment/Therapy Walk-Ins:  Monday and Wednesday: 8 am until slots are full. Every 1st and 2nd Fridays of the month: 1 pm - 5 pm.  NO ASSESSMENT/THERAPY WALK-INS ON [place]  New Patient Assessment/Medication Management Walk-Ins:  Monday - Friday:  8 am - 11 am.  For all walk-ins, we ask that you arrive by 7:30 am because patients will be seen in the order of arrival.  Availability is limited; therefore, you may not be seen on the same day that you walk-in.  Our goal is to serve and meet the needs of our community to the best of our ability.  SUBSTANCE USE TREATMENT  [facility] [place], [place], [address] [phone] phone NOTE: [facility] is no longer offering IOP services.  Serves those who are low-income or have no insurance.  [facility] [address], [place], [place], [address] [phone] phone [phone] fax NOTE: Does have Substance Abuse-Intensive Outpatient Program [facility]) as well as transitional housing if eligible.  [place], [place], [address] [phone] phone [phone] fax  [place] [phone] [place], [place], [address] [phone] phone [phone] fax  HALFWAY HOUSES:  [facility] [place] [phone]  [employer].oxfordvacancies.com  12 STEP PROGRAMS:  Alcoholics Anonymous of [place] ReportZoo.com.cy  Narcotics Anonymous of  [employer]dk  Al-Anon of [employer], [place] www.g[place]alanon.org/find-meetings.html  Nar-Anon https://nar-anon.org/find-a-meetin  Local Shelters  [facility] [address] [place], [place] [address] [phone] Population served: Adult men & women (15 years old and older, able to perform  activities for daily living) Documents required: Valid ID & Social Security Card  [facility] [address] [place], [place] [address] [phone] Population served: Males 18+ Documents required: Valid ID & Social Security [facility] [place], [place], [place] [address] [phone] Population Served: Families with children, adult women, and adult men.  [facility] [address] [place], [place] [address] [phone] Population served: Men 18+, preference for disabled and/or veterans Eligibility: By referral only

## 2022-01-20 NOTE — BH IP Treatment Plan (Signed)
Interdisciplinary Treatment and Diagnostic Plan Update  01/20/2022 Time of Session: [place] MRN: [id number]  Diagnosis:  Final diagnoses:  Alcohol abuse with intoxication ([place])     Current Medications:  Current Facility-Administered Medications  Medication Dose Route Frequency Provider Last Rate Last Admin   acetaminophen (TYLENOL) tablet 650 mg  650 mg Oral Q6H PRN Tharon Aquas, NP   650 mg at 01/19/22 1753   alum & mag hydroxide-simeth (MAALOX/MYLANTA) 200-200-20 MG/5ML suspension 30 mL  30 mL Oral Q4H PRN Tharon Aquas, NP       gabapentin (NEURONTIN) capsule 300 mg  300 mg Oral TID Tharon Aquas, NP   300 mg at 01/20/22 6789   hydrOXYzine (ATARAX) tablet 25 mg  25 mg Oral Q6H PRN Tharon Aquas, NP       loperamide (IMODIUM) capsule 2-4 mg  2-4 mg Oral PRN Tharon Aquas, NP       LORazepam (ATIVAN) tablet 1 mg  1 mg Oral Q6H PRN Tharon Aquas, NP       LORazepam (ATIVAN) tablet 1 mg  1 mg Oral TID Tharon Aquas, NP       Followed by   Derrill Memo ON 01/21/2022] LORazepam (ATIVAN) tablet 1 mg  1 mg Oral BID Tharon Aquas, NP       Followed by   Derrill Memo ON 01/23/2022] LORazepam (ATIVAN) tablet 1 mg  1 mg Oral Daily Tharon Aquas, NP       magnesium hydroxide (MILK OF MAGNESIA) suspension 30 mL  30 mL Oral Daily PRN Tharon Aquas, NP       multivitamin with minerals tablet 1 tablet  1 tablet Oral Daily Tharon Aquas, NP   1 tablet at 01/20/22 0925   ondansetron (ZOFRAN-ODT) disintegrating tablet 4 mg  4 mg Oral Q6H PRN Tharon Aquas, NP       thiamine (VITAMIN B1) tablet 100 mg  100 mg Oral Daily Tharon Aquas, NP   100 mg at 01/20/22 3810   traZODone (DESYREL) tablet 50 mg  50 mg Oral QHS PRN Tharon Aquas, NP       Current Outpatient Medications  Medication Sig Dispense Refill   citalopram (CELEXA) 10 MG tablet Take 1 tablet (10 mg total) by mouth daily. 7 tablet 0   PTA Medications: Prior  to Admission medications   Medication Sig Start Date End Date Taking? Authorizing Provider  citalopram (CELEXA) 10 MG tablet Take 1 tablet (10 mg total) by mouth daily. 10/29/21  Yes Vesta Mixer, NP  ferrous sulfate 325 (65 FE) MG tablet Take 1 tablet (325 mg total) by mouth daily with breakfast. Patient not taking: Reported on 09/23/2018 08/09/18 10/09/18  Elodia Florence., MD  pantoprazole (PROTONIX) 40 MG tablet Take 1 tablet (40 mg total) by mouth 2 (two) times daily before a meal. Patient not taking: Reported on 08/14/2020 06/27/20 08/14/20  Charlynne Cousins, MD    Patient Stressors: Financial difficulties   Substance abuse   Other: Homelessness    Patient Strengths: Ability for insight  Average or above average intelligence  Capable of independent living  Communication skills  General fund of knowledge   Treatment Modalities: Medication Management, Group therapy, Case management,  1 to 1 session with clinician, Psychoeducation, Recreational therapy.   Physician Treatment Plan for Primary and Secondary Diagnosis:  Final diagnoses:  Alcohol abuse with intoxication ([place])   Long Term Goal(s): Improvement in symptoms so as  ready for discharge  Short Term Goals: Patient will verbalize feelings in meetings with treatment team members. Patient will attend at least of 50% of the groups daily. Pt will complete the PHQ9 on admission, day 3 and discharge. Patient will take medications as prescribed daily.  Medication Management: Evaluate patient's response, side effects, and tolerance of medication regimen.  Therapeutic Interventions: 1 to 1 sessions, Unit Group sessions and Medication administration.  Evaluation of Outcomes: Progressing  LCSW Treatment Plan for Primary Diagnosis:  Final diagnoses:  Alcohol abuse with intoxication ([place])    Long Term Goal(s): Safe transition to appropriate next level of care at discharge.  Short Term Goals: Facilitate acceptance of  mental health diagnosis and concerns through verbal commitment to aftercare plan and appointments at discharge., Patient will identify one social support prior to discharge to aid in patient's recovery., Patient will attend AA/NA groups as scheduled., Identify minimum of 2 triggers associated with mental health/substance abuse issues with treatment team members., and Increase skills for wellness and recovery by attending 50% of scheduled groups.  Therapeutic Interventions: Assess for all discharge needs, 1 to 1 time with Education officer, museum, Explore available resources and support systems, Assess for adequacy in community support network, Educate family and significant other(s) on suicide prevention, Complete Psychosocial Assessment, Interpersonal group therapy.  Evaluation of Outcomes: Progressing   Progress in Treatment: Attending groups: Yes. Participating in groups: Yes. Taking medication as prescribed: Yes. Toleration medication: Yes. Family/Significant other contact made: No, will contact:  Patient did not provide permission for LCSW to gain collateral.  Patient understands diagnosis: Yes. Discussing patient identified problems/goals with staff: Yes. Medical problems stabilized or resolved: Yes. Denies suicidal/homicidal ideation: Yes. Issues/concerns per patient self-inventory: Yes. Other: alcohol use and treatment   New problem(s) identified: No, Describe:  other than reported on admission.   New Short Term/Long Term Goal(s): Safe transition to appropriate next level of care at discharge, Engage patient in therapeutic group addressing interpersonal concerns. Engage patient in aftercare planning with referrals and resources, Increase ability to appropriately verbalize feelings, Facilitate acceptance of mental health diagnosis and concerns and Identify triggers associated with mental health/substance abuse issues.    Patient Goals:  Patient reports interest in being linked with outpatient  substance abuse services at this time. Patient reports no interest in residential placement as he would like to work. LCSW attempted to explain to patient the various work therapy programs available, however patient declined. Patient reports he is unsure if he would be able to make it to an IOP due to lack of transportation. Patient reports being resourceful stating he has been homeless for 20 years and reports getting his meals and needs met from the [facility].   Discharge Plan or Barriers: LCSW will provide resources for outpatient substance abuse services within his AVS. LCSW will also include sober living resources and local homeless shelters for his review. Patient agreeable to plan.   Reason for Continuation of Hospitalization: Withdrawal symptoms  Estimated Length of Stay: 3-5 days  Last 3 [place] Suicide Severity Risk Score: [facility] from 01/19/2022 in [facility] ED from 01/11/2022 in [facility] from 12/08/2021 in [place] DEPT  C-SSRS RISK CATEGORY No Risk No Risk High Risk       Last PHQ 2/9 Scores:    08/08/2021    6:02 AM 09/05/2020    2:56 AM 06/18/2020    4:19 AM  Depression screen PHQ 2/9  Decreased Interest  _0 Down, Depressed, Hopeless _1 PHQ - 2 Score _2 Altered sleeping _3 Tired, decreased energy _4 Change in appetite _5 Feeling bad or failure about yourself  _6 Trouble concentrating _7 Moving slowly or fidgety/restless _8 Suicidal thoughts _9 PHQ-9 Score _10 Difficult doing work/chores Very difficult Very difficult Very difficult    Scribe for Treatment Team: Gigi Gin 01/20/2022 3:28 PM

## 2022-01-20 NOTE — ED Notes (Signed)
Pt refused AA

## 2022-01-20 NOTE — ED Notes (Signed)
Pt is awake and alert.  Flat affect and mood.  Pt does appear anxious and tremulous he is on ativan detox protocol. Next dose at 10am.  Staff will continue to monitor for safety.

## 2022-01-20 NOTE — ED Notes (Signed)
Pt sleeping at present, no distress noted.  Monitoring for safety. 

## 2022-01-20 NOTE — Tx Team (Signed)
LCSW met with patient to assess current mood, affect, physical state, and inquire about needs/goals while here in [facility] and after discharge. Patient reports he presented due to alcohol intoxication and reports being brought in by police. Patient reports he has been living on the streets for 20+ years. Patient denies having access to transportation, and reports no social support. Patient reports interest in being linked with outpatient substance abuse services at this time. Patient reports no interest in residential placement as he would like to work. LCSW attempted to explain to patient the various work therapy programs available, however patient declined. Patient reports he is unsure if he would be able to make it to an IOP due to lack of transportation. Patient [place] is [place] and reports if would be hard for him to make it to be [facility] for services. Patient reports being referred to [facility] about a year ago, however reports he did not follow through because he did not know what the services were for. Patient reports being resourceful stating he has been homeless for 20 years and reports getting his meals and needs met from the [facility]. Patient currently denies any SI/HI/AVH and reports mood as "alright". Patient aware that LCSW will provide resources for outpatient substance abuse services within his AVS. LCSW will also include sober living resources and local homeless shelters for his review. Patient agreeable to plan.    LCSW will continue to follow and provide support to patient while on [facility] unit.   Barry Conn, LCSW Clinical Social Worker [place] [facility] Ph: [phone]

## 2022-01-20 NOTE — ED Provider Notes (Signed)
Facility Based Crisis Admission H&P  Date: 01/20/22 Patient Name: Barry Moore MRN: [id number] Chief Complaint:  Chief Complaint  Patient presents with   Alcohol Problem   Suicidal     Diagnoses:  Final diagnoses:  Alcohol abuse with intoxication ([place])   HPI:  On reassessment, pt is a&ox3, in no acute distress, non-toxic appearing. He reports he continues to be interested in detox. He reports he drank 5 or 6 40z yesterday, which he believes is "overdoing it". Pt reports he usually drinks 2 or 3 40oz. When asked about reported withdrawal related seizures and DTs yesterday, pt denies seizures or DTs, reports he does get bad tremors when withdrawing. He denies he has ever been hospitalized for withdrawal related seizures or DTs. He reports current withdrawal sx include sweating, nausea, dizziness, and tremor. He denies fever, chills, cp, palpitations, sob, abd pain, vomiting, diarrhea. Fine distal tremor noted b/l on pt's hands and fingers.   Pt is interested in detox and then residential substance use treatment. Discussed transfer to [facility] for detox and linkage to residential substance use treatment. Pt agrees with plan.  PHQ 2-9:  [place] ED from 08/07/2021 in [place] DEPT ED from 09/04/2020 in [place] ED from 06/17/2020 in [place] DEPT  Thoughts that you would be better off dead, or of hurting yourself in some way More than half the days More than half the days More than half the days  PHQ-9 Total Score '14 18 21       '$ Flowsheet Row ED from 01/11/2022 in [place] ED from 12/08/2021 in [place] [facility] DEPT ED from 12/06/2021 in [place] DEPT  C-SSRS RISK CATEGORY No Risk High Risk Error: Q3, 4, or 5 should not be populated when Q2 is No        Total Time spent with patient: 20  minutes  Musculoskeletal  Strength & Muscle Tone:  pt sitting on assessment Gait & Station:  pt sitting on assessment Patient leans:  pt sitting on assessment  Psychiatric Specialty Exam  Presentation General Appearance:  Disheveled  Eye Contact: Fair  Speech: Clear and Coherent  Speech Volume: Normal  Handedness: Right   Mood and Affect  Mood: Euthymic  Affect: Blunt   Thought Process  Thought Processes: Coherent; Goal Directed; Linear  Descriptions of Associations:Intact  Orientation:Full (Time, Place and Person)  Thought Content:WDL  Diagnosis of Schizophrenia or Schizoaffective disorder in past: No   Hallucinations:Hallucinations: None  Ideas of Reference:None  Suicidal Thoughts:Suicidal Thoughts: No  Homicidal Thoughts:Homicidal Thoughts: No   Sensorium  Memory: Immediate Fair  Judgment: Intact  Insight: Present   Executive Functions  Concentration: Fair  Attention Span: Fair  Recall: [employer] of Knowledge: Fair  Language: Fair   Psychomotor Activity  Psychomotor Activity: Psychomotor Activity: Tremor   Assets  Assets: Communication Skills; Desire for Improvement; Resilience   Sleep  Sleep: Sleep: Fair   No data recorded  Physical Exam Constitutional:      Appearance: He is diaphoretic.  Eyes:     General: No scleral icterus. Cardiovascular:     Rate and Rhythm: Tachycardia present.  Pulmonary:     Effort: Pulmonary effort is normal. No respiratory distress.  Skin:    Coloration: Skin is not jaundiced.  Neurological:     Mental Status: He is alert and oriented to person, place, and time.  Psychiatric:  Attention and Perception: Attention and perception normal.        Mood and Affect: Mood normal. Affect is blunt.        Speech: Speech normal.        Behavior: Behavior normal. Behavior is cooperative.        Thought Content: Thought content normal.    Review of Systems  Constitutional:   Positive for diaphoresis. Negative for chills and fever.  Respiratory:  Negative for shortness of breath.   Cardiovascular:  Negative for chest pain and palpitations.  Gastrointestinal:  Positive for nausea. Negative for abdominal pain, diarrhea and vomiting.  Neurological:  Positive for dizziness and tremors.    Blood pressure (!) 159/85, pulse (!) 103, temperature 98 F (36.7 C), temperature source Oral, resp. rate 20, SpO2 97 %. There is no height or weight on file to calculate BMI.  Past Psychiatric History:   Is the patient at risk to self? No  Has the patient been a risk to self in the past 6 months? Yes .    Has the patient been a risk to self within the distant past? Yes   Is the patient a risk to others? No   Has the patient been a risk to others in the past 6 months? No   Has the patient been a risk to others within the distant past? No   Past Medical History:  Past Medical History:  Diagnosis Date   Alcoholic hepatitis    Depression    ETOH abuse    Gastric bleed 08/2018   Hypertension    Pancreatitis    Seizures ([place])    Suicidal behavior     Past Surgical History:  Procedure Laterality Date   BIOPSY  08/05/2018   Procedure: BIOPSY;  Surgeon: Irving Copas., MD;  Location: [facility] ENDOSCOPY;  Service: Gastroenterology;;   BIOPSY  08/07/2018   Procedure: BIOPSY;  Surgeon: Thornton Park, MD;  [facility] ENDOSCOPY;  Service: Gastroenterology;;   BIOPSY  01/02/2019   Procedure: BIOPSY;  Surgeon: Gatha Mayer, MD;  Location: [facility] ENDOSCOPY;  Service: Endoscopy;;   COLONOSCOPY WITH PROPOFOL N/A 08/07/2018   Procedure: COLONOSCOPY WITH PROPOFOL;  Surgeon: Thornton Park, MD;  Location: [facility];  Service: Gastroenterology;  Laterality: N/A;   ESOPHAGOGASTRODUODENOSCOPY N/A 01/02/2019   Procedure: ESOPHAGOGASTRODUODENOSCOPY (EGD);  Surgeon: Gatha Mayer, MD;  Location: [facility] ENDOSCOPY;  Service: Endoscopy;  Laterality: N/A;   ESOPHAGOGASTRODUODENOSCOPY  N/A 07/03/2019   Procedure: ESOPHAGOGASTRODUODENOSCOPY (EGD);  Surgeon: Wonda Horner, MD;  Location: [facility] ENDOSCOPY;  Service: Endoscopy;  Laterality: N/A;   ESOPHAGOGASTRODUODENOSCOPY N/A 06/26/2020   Procedure: ESOPHAGOGASTRODUODENOSCOPY (EGD);  Surgeon: Arta Silence, MD;  Location: [facility] ENDOSCOPY;  Service: Endoscopy;  Laterality: N/A;   ESOPHAGOGASTRODUODENOSCOPY (EGD) WITH PROPOFOL N/A 11/02/2016   Procedure: ESOPHAGOGASTRODUODENOSCOPY (EGD) WITH PROPOFOL;  Surgeon: Carol Ada, MD;  Location: [facility] ENDOSCOPY;  Service: Endoscopy;  Laterality: N/A;   ESOPHAGOGASTRODUODENOSCOPY (EGD) WITH PROPOFOL N/A 08/05/2018   Procedure: ESOPHAGOGASTRODUODENOSCOPY (EGD) WITH PROPOFOL;  Surgeon: Rush Landmark Telford Nab., MD;  Location: [facility];  Service: Gastroenterology;  Laterality: N/A;   ESOPHAGOGASTRODUODENOSCOPY (EGD) WITH PROPOFOL N/A 07/14/2019   Procedure: ESOPHAGOGASTRODUODENOSCOPY (EGD) WITH PROPOFOL;  Surgeon: Otis Brace, MD;  Location: [facility] ENDOSCOPY;  Service: Gastroenterology;  Laterality: N/A;   HERNIA REPAIR     LEG SURGERY     POLYPECTOMY  08/07/2018   Procedure: POLYPECTOMY;  Surgeon: Thornton Park, MD;  Location: [facility] ENDOSCOPY;  Service: Gastroenterology;;    Family History:  Family History  Problem  Relation Age of Onset   Diabetes Mother    Alcoholism Mother    Emphysema Father    Lung cancer Father    Alcoholism Father     Social History:  Social History   Socioeconomic History   Marital status: Divorced    Spouse name: Not on file   Number of children: Not on file   Years of education: Not on file   Highest education level: Not on file  Occupational History   Not on file  Tobacco Use   Smoking status: Every Day    Packs/day: 1.00    Types: Cigarettes   Smokeless tobacco: Never  Vaping Use   Vaping Use: Never used  Substance and Sexual Activity   Alcohol use: Yes    Comment: 5+ BEERS DAILY, Patient does not know how much he drank tonight   Drug use: Not  Currently    Frequency: 3.0 times per week    Types: Cocaine   Sexual activity: Not Currently  Other Topics Concern   Not on file  Social History Narrative   Not on file   Social Determinants of Health   Financial Resource Strain: Not on file  Food Insecurity: Not on file  Transportation Needs: Not on file  Physical Activity: Not on file  Stress: Not on file  Social Connections: Not on file  Intimate Partner Violence: Not on file    SDOH:  SDOH Screenings   Alcohol Screen: High Risk (07/16/2021)  Depression (PHQ2-9): High Risk (08/08/2021)  Tobacco Use: High Risk (01/19/2022)    Last Labs:  Admission on 01/19/2022  Component Date Value Ref Range Status   SARS Coronavirus 2 by RT PCR 01/19/2022 NEGATIVE  NEGATIVE Final   Comment: (NOTE) SARS-CoV-2 target nucleic acids are NOT DETECTED.  The SARS-CoV-2 RNA is generally detectable in upper respiratory specimens during the acute phase of infection. The lowest concentration of SARS-CoV-2 viral copies this assay can detect is 138 copies/mL. A negative result does not preclude SARS-Cov-2 infection and should not be used as the sole basis for treatment or other patient management decisions. A negative result may occur with  improper specimen collection/handling, submission of specimen other than nasopharyngeal swab, presence of viral mutation(s) within the areas targeted by this assay, and inadequate number of viral copies(<138 copies/mL). A negative result must be combined with clinical observations, patient history, and epidemiological information. The expected result is Negative.  Fact Sheet for Patients:  EntrepreneurPulse.com.au  Fact Sheet for Healthcare Providers:  IncredibleEmployment.be  This test is no                          t yet approved or cleared by the [place] FDA and  has been authorized for detection and/or diagnosis of SARS-CoV-2 by FDA under an Emergency Use  Authorization (EUA). This EUA will remain  in effect (meaning this test can be used) for the duration of the COVID-19 declaration under Section 564(b)(1) of the Act, 21 U.S.C.section 360bbb-3(b)(1), unless the authorization is terminated  or revoked sooner.       Influenza A by PCR 01/19/2022 NEGATIVE  NEGATIVE Final   Influenza B by PCR 01/19/2022 NEGATIVE  NEGATIVE Final   Comment: (NOTE) The Xpert Xpress SARS-CoV-2/FLU/RSV plus assay is intended as an aid in the diagnosis of influenza from Nasopharyngeal swab specimens and should not be used as a sole basis for treatment. Nasal washings and aspirates are unacceptable for Xpert Xpress SARS-CoV-2/FLU/RSV testing.  Fact Sheet for Patients: EntrepreneurPulse.com.au  Fact Sheet for Healthcare Providers: IncredibleEmployment.be  This test is not yet approved or cleared by the [place] FDA and has been authorized for detection and/or diagnosis of SARS-CoV-2 by FDA under an Emergency Use Authorization (EUA). This EUA will remain in effect (meaning this test can be used) for the duration of the COVID-19 declaration under Section 564(b)(1) of the Act, 21 U.S.C. section 360bbb-3(b)(1), unless the authorization is terminated or revoked.  Performed at [facility], [place] [address] [place], [place] [address]    WBC 01/19/2022 4.8  4.0 - 10.5 K/uL Final   RBC 01/19/2022 3.95 (L)  4.22 - 5.81 MIL/uL Final   Hemoglobin 01/19/2022 10.8 (L)  13.0 - 17.0 g/dL Final   HCT 01/19/2022 33.2 (L)  39.0 - 52.0 % Final   MCV 01/19/2022 84.1  80.0 - 100.0 fL Final   MCH 01/19/2022 27.3  26.0 - 34.0 pg Final   MCHC 01/19/2022 32.5  30.0 - 36.0 g/dL Final   RDW 01/19/2022 16.7 (H)  11.5 - 15.5 % Final   Platelets 01/19/2022 161  150 - 400 K/uL Final   nRBC 01/19/2022 0.0  0.0 - 0.2 % Final   Neutrophils Relative % 01/19/2022 40  % Final   Neutro Abs 01/19/2022 2.0  1.7 - 7.7 K/uL Final   Lymphocytes  Relative 01/19/2022 38  % Final   Lymphs Abs 01/19/2022 1.8  0.7 - 4.0 K/uL Final   Monocytes Relative 01/19/2022 17  % Final   Monocytes Absolute 01/19/2022 0.8  0.1 - 1.0 K/uL Final   Eosinophils Relative 01/19/2022 3  % Final   Eosinophils Absolute 01/19/2022 0.1  0.0 - 0.5 K/uL Final   Basophils Relative 01/19/2022 2  % Final   Basophils Absolute 01/19/2022 0.1  0.0 - 0.1 K/uL Final   Immature Granulocytes 01/19/2022 0  % Final   Abs Immature Granulocytes 01/19/2022 0.02  0.00 - 0.07 K/uL Final   Performed at [facility], [place] [address] [place], [place] [address]   Sodium 01/19/2022 138  135 - 145 mmol/L Final   Potassium 01/19/2022 3.8  3.5 - 5.1 mmol/L Final   Chloride 01/19/2022 102  98 - 111 mmol/L Final   CO2 01/19/2022 22  22 - 32 mmol/L Final   Glucose, Bld 01/19/2022 89  70 - 99 mg/dL Final   Glucose reference range applies only to samples taken after fasting for at least 8 hours.   BUN 01/19/2022 7  6 - 20 mg/dL Final   Creatinine, Ser 01/19/2022 0.64  0.61 - 1.24 mg/dL Final   Calcium 01/19/2022 9.2  8.9 - 10.3 mg/dL Final   Total Protein 01/19/2022 8.1  6.5 - 8.1 g/dL Final   Albumin 01/19/2022 4.0  3.5 - 5.0 g/dL Final   AST 01/19/2022 140 (H)  15 - 41 U/L Final   ALT 01/19/2022 68 (H)  0 - 44 U/L Final   Alkaline Phosphatase 01/19/2022 144 (H)  38 - 126 U/L Final   Total Bilirubin 01/19/2022 0.4  0.3 - 1.2 mg/dL Final   GFR, Estimated 01/19/2022 >60  >60 mL/min Final   Comment: (NOTE) Calculated using the CKD-EPI Creatinine Equation (2021)    Anion gap 01/19/2022 14  5 - 15 Final   Performed at [facility] [address] [place], [place] [address]   Hgb A1c MFr Bld 01/19/2022 5.3  4.8 - 5.6 % Final   Comment: (NOTE) Pre diabetes:  5.7%-6.4%  Diabetes:              >6.4%  Glycemic control for   <7.0% adults with diabetes    Mean Plasma Glucose 01/19/2022 105.41  mg/dL Final   Performed at [facility] [address]  [place], [place] [address]   Alcohol, Ethyl (B) 01/19/2022 290 (H)  <10 mg/dL Final   Comment: (NOTE) Lowest detectable limit for serum alcohol is 10 mg/dL.  For medical purposes only. Performed at [facility], [place] [address], [place], [place] [address]    Cholesterol 01/19/2022 251 (H)  0 - 200 mg/dL Final   Triglycerides 01/19/2022 163 (H)  <150 mg/dL Final   HDL 01/19/2022 105  >40 mg/dL Final   Total CHOL/HDL Ratio 01/19/2022 2.4  RATIO Final   VLDL 01/19/2022 33  0 - 40 mg/dL Final   LDL Cholesterol 01/19/2022 113 (H)  0 - 99 mg/dL Final   Comment:        Total Cholesterol/HDL:CHD Risk Coronary Heart Disease Risk Table                     Men   Women  1/2 Average Risk   3.4   3.3  Average Risk       5.0   4.4  2 X Average Risk   9.6   7.1  3 X Average Risk  23.4   11.0        Use the calculated Patient Ratio above and the CHD Risk Table to determine the patient's CHD Risk.        ATP III CLASSIFICATION (LDL):  <100     mg/dL   Optimal  100-129  mg/dL   Near or Above                    Optimal  130-159  mg/dL   Borderline  160-189  mg/dL   High  >190     mg/dL   Very High Performed at [facility] [address] [place], [place] [address]    TSH 01/19/2022 0.415  0.350 - 4.500 uIU/mL Final   Comment: Performed by a 3rd Generation assay with a functional sensitivity of <=0.01 uIU/mL. Performed at [facility], [place] [address] [place], [place] [address]    POC Amphetamine UR 01/19/2022 None Detected  NONE DETECTED (Cut Off Level 1000 ng/mL) Final   POC Secobarbital (BAR) 01/19/2022 None Detected  NONE DETECTED (Cut Off Level 300 ng/mL) Final   POC Buprenorphine (BUP) 01/19/2022 None Detected  NONE DETECTED (Cut Off Level 10 ng/mL) Final   POC Oxazepam (BZO) 01/19/2022 None Detected  NONE DETECTED (Cut Off Level 300 ng/mL) Final   POC Cocaine UR 01/19/2022 None Detected  NONE DETECTED (Cut Off Level 300 ng/mL) Final   POC Methamphetamine UR 01/19/2022  None Detected  NONE DETECTED (Cut Off Level 1000 ng/mL) Final   POC Morphine 01/19/2022 None Detected  NONE DETECTED (Cut Off Level 300 ng/mL) Final   POC Methadone UR 01/19/2022 None Detected  NONE DETECTED (Cut Off Level 300 ng/mL) Final   POC Oxycodone UR 01/19/2022 None Detected  NONE DETECTED (Cut Off Level 100 ng/mL) Final   POC Marijuana UR 01/19/2022 None Detected  NONE DETECTED (Cut Off Level 50 ng/mL) Final   SARSCOV2ONAVIRUS 2 AG 01/19/2022 NEGATIVE  NEGATIVE Final   Comment: (NOTE) SARS-CoV-2 antigen NOT DETECTED.   Negative results are presumptive.  Negative results  do not preclude SARS-CoV-2 infection and should not be used as the sole basis for treatment or other patient management decisions, including infection  control decisions, particularly in the presence of clinical signs and  symptoms consistent with COVID-19, or in those who have been in contact with the virus.  Negative results must be combined with clinical observations, patient history, and epidemiological information. The expected result is Negative.  Fact Sheet for Patients: HandmadeRecipes.com.cy  Fact Sheet for Healthcare Providers: FuneralLife.at  This test is not yet approved or cleared by the [place] FDA and  has been authorized for detection and/or diagnosis of SARS-CoV-2 by FDA under an Emergency Use Authorization (EUA).  This EUA will remain in effect (meaning this test can be used) for the duration of  the COV                          ID-19 declaration under Section 564(b)(1) of the Act, 21 U.S.C. section 360bbb-3(b)(1), unless the authorization is terminated or revoked sooner.    Admission on 01/11/2022, Discharged on 01/12/2022  Component Date Value Ref Range Status   WBC 01/11/2022 6.0  4.0 - 10.5 K/uL Final   RBC 01/11/2022 3.96 (L)  4.22 - 5.81 MIL/uL Final   Hemoglobin 01/11/2022 11.1 (L)  13.0 - 17.0 g/dL Final   HCT 01/11/2022 33.1  (L)  39.0 - 52.0 % Final   MCV 01/11/2022 83.6  80.0 - 100.0 fL Final   MCH 01/11/2022 28.0  26.0 - 34.0 pg Final   MCHC 01/11/2022 33.5  30.0 - 36.0 g/dL Final   RDW 01/11/2022 17.0 (H)  11.5 - 15.5 % Final   Platelets 01/11/2022 115 (L)  150 - 400 K/uL Final   nRBC 01/11/2022 0.0  0.0 - 0.2 % Final   Neutrophils Relative % 01/11/2022 54  % Final   Neutro Abs 01/11/2022 3.3  1.7 - 7.7 K/uL Final   Lymphocytes Relative 01/11/2022 29  % Final   Lymphs Abs 01/11/2022 1.7  0.7 - 4.0 K/uL Final   Monocytes Relative 01/11/2022 14  % Final   Monocytes Absolute 01/11/2022 0.8  0.1 - 1.0 K/uL Final   Eosinophils Relative 01/11/2022 1  % Final   Eosinophils Absolute 01/11/2022 0.1  0.0 - 0.5 K/uL Final   Basophils Relative 01/11/2022 2  % Final   Basophils Absolute 01/11/2022 0.1  0.0 - 0.1 K/uL Final   Immature Granulocytes 01/11/2022 0  % Final   Abs Immature Granulocytes 01/11/2022 0.02  0.00 - 0.07 K/uL Final   Performed at [facility], [place] [address], [place], [place] [address]   Sodium 01/11/2022 136  135 - 145 mmol/L Final   Potassium 01/11/2022 3.7  3.5 - 5.1 mmol/L Final   Chloride 01/11/2022 101  98 - 111 mmol/L Final   CO2 01/11/2022 21 (L)  22 - 32 mmol/L Final   Glucose, Bld 01/11/2022 106 (H)  70 - 99 mg/dL Final   Glucose reference range applies only to samples taken after fasting for at least 8 hours.   BUN 01/11/2022 5 (L)  6 - 20 mg/dL Final   Creatinine, Ser 01/11/2022 0.66  0.61 - 1.24 mg/dL Final   Calcium 01/11/2022 8.5 (L)  8.9 - 10.3 mg/dL Final   Total Protein 01/11/2022 8.0  6.5 - 8.1 g/dL Final   Albumin 01/11/2022 4.1  3.5 - 5.0 g/dL Final   AST 01/11/2022 149 (H)  15 - 41 U/L  Final   ALT 01/11/2022 63 (H)  0 - 44 U/L Final   Alkaline Phosphatase 01/11/2022 131 (H)  38 - 126 U/L Final   Total Bilirubin 01/11/2022 0.7  0.3 - 1.2 mg/dL Final   GFR, Estimated 01/11/2022 >60  >60 mL/min Final   Comment: (NOTE) Calculated using the CKD-EPI Creatinine Equation  (2021)    Anion gap 01/11/2022 14  5 - 15 Final   Performed at [facility], [place] [address], [place], [place] [address]   Alcohol, Ethyl (B) 01/11/2022 351 (HH)  <10 mg/dL Final   Comment: CRITICAL RESULT CALLED TO, READ BACK BY AND VERIFIED WITH B. SANGALANG RN, 0014, 01/12/22, EADEDOKUN (NOTE) Lowest detectable limit for serum alcohol is 10 mg/dL.  For medical purposes only. Performed at [facility], [place] [address] [place], [place] [address]   Admission on 12/08/2021, Discharged on 12/09/2021  Component Date Value Ref Range Status   Sodium 12/08/2021 140  135 - 145 mmol/L Final   Potassium 12/08/2021 3.8  3.5 - 5.1 mmol/L Final   Chloride 12/08/2021 103  98 - 111 mmol/L Final   CO2 12/08/2021 24  22 - 32 mmol/L Final   Glucose, Bld 12/08/2021 93  70 - 99 mg/dL Final   Glucose reference range applies only to samples taken after fasting for at least 8 hours.   BUN 12/08/2021 5 (L)  6 - 20 mg/dL Final   Creatinine, Ser 12/08/2021 0.66  0.61 - 1.24 mg/dL Final   Calcium 12/08/2021 8.7 (L)  8.9 - 10.3 mg/dL Final   Total Protein 12/08/2021 8.0  6.5 - 8.1 g/dL Final   Albumin 12/08/2021 4.2  3.5 - 5.0 g/dL Final   AST 12/08/2021 176 (H)  15 - 41 U/L Final   ALT 12/08/2021 75 (H)  0 - 44 U/L Final   Alkaline Phosphatase 12/08/2021 152 (H)  38 - 126 U/L Final   Total Bilirubin 12/08/2021 0.7  0.3 - 1.2 mg/dL Final   GFR, Estimated 12/08/2021 >60  >60 mL/min Final   Comment: (NOTE) Calculated using the CKD-EPI Creatinine Equation (2021)    Anion gap 12/08/2021 13  5 - 15 Final   Performed at [facility], [place] [address] [place], [place] [address]   Alcohol, Ethyl (B) 12/08/2021 372 (HH)  <10 mg/dL Final   Comment: CRITICAL RESULT CALLED TO, READ BACK BY AND VERIFIED WITH RIVERS,C AT 2216 ON 12/08/21 BY [email or web address] (NOTE) Lowest detectable limit for serum alcohol is 10 mg/dL.  For medical purposes only. Performed at [facility], [place] [address] [place], [place] [address]    Salicylate Lvl 72/11/4707 <7.0 (L)  7.0 - 30.0 mg/dL Final   Performed at [facility] [address], [place], [place] [address]   Acetaminophen (Tylenol), Serum 12/08/2021 <10 (L)  10 - 30 ug/mL Final   Comment: (NOTE) Therapeutic concentrations vary significantly. A range of 10-30 ug/mL  may be an effective concentration for many patients. However, some  are best treated at concentrations outside of this range. Acetaminophen concentrations >150 ug/mL at 4 hours after ingestion  and >50 ug/mL at 12 hours after ingestion are often associated with  toxic reactions.  Performed at [facility], [place] [address] [place], [place] [address]    WBC 12/08/2021 4.0  4.0 - 10.5 K/uL Final   RBC 12/08/2021 4.26  4.22 - 5.81 MIL/uL Final   Hemoglobin 12/08/2021 11.6 (L)  13.0 - 17.0  g/dL Final   HCT 12/08/2021 36.1 (L)  39.0 - 52.0 % Final   MCV 12/08/2021 84.7  80.0 - 100.0 fL Final   MCH 12/08/2021 27.2  26.0 - 34.0 pg Final   MCHC 12/08/2021 32.1  30.0 - 36.0 g/dL Final   RDW 12/08/2021 17.2 (H)  11.5 - 15.5 % Final   Platelets 12/08/2021 120 (L)  150 - 400 K/uL Final   nRBC 12/08/2021 0.0  0.0 - 0.2 % Final   Performed at [facility], [place] [address] [place], [place] [address]   Opiates 12/08/2021 NONE DETECTED  NONE DETECTED Final   Cocaine 12/08/2021 NONE DETECTED  NONE DETECTED Final   Benzodiazepines 12/08/2021 NONE DETECTED  NONE DETECTED Final   Amphetamines 12/08/2021 NONE DETECTED  NONE DETECTED Final   Tetrahydrocannabinol 12/08/2021 NONE DETECTED  NONE DETECTED Final   Barbiturates 12/08/2021 NONE DETECTED  NONE DETECTED Final   Comment: (NOTE) DRUG SCREEN FOR MEDICAL PURPOSES ONLY.  IF CONFIRMATION IS NEEDED FOR ANY PURPOSE, NOTIFY LAB WITHIN 5 DAYS.  LOWEST DETECTABLE LIMITS FOR URINE DRUG SCREEN Drug Class                     Cutoff (ng/mL) Amphetamine and metabolites     1000 Barbiturate and metabolites    200 Benzodiazepine                 655 Tricyclics and metabolites     300 Opiates and metabolites        300 Cocaine and metabolites        300 THC                            50 Performed at [facility], [place] [address], [place], [place] [address]   Admission on 12/06/2021, Discharged on 12/07/2021  Component Date Value Ref Range Status   Sodium 12/06/2021 138  135 - 145 mmol/L Final   Potassium 12/06/2021 3.6  3.5 - 5.1 mmol/L Final   Chloride 12/06/2021 103  98 - 111 mmol/L Final   CO2 12/06/2021 26  22 - 32 mmol/L Final   Glucose, Bld 12/06/2021 94  70 - 99 mg/dL Final   Glucose reference range applies only to samples taken after fasting for at least 8 hours.   BUN 12/06/2021 <5 (L)  6 - 20 mg/dL Final   Creatinine, Ser 12/06/2021 0.77  0.61 - 1.24 mg/dL Final   Calcium 12/06/2021 9.0  8.9 - 10.3 mg/dL Final   Total Protein 12/06/2021 8.2 (H)  6.5 - 8.1 g/dL Final   Albumin 12/06/2021 4.1  3.5 - 5.0 g/dL Final   AST 12/06/2021 149 (H)  15 - 41 U/L Final   ALT 12/06/2021 66 (H)  0 - 44 U/L Final   Alkaline Phosphatase 12/06/2021 147 (H)  38 - 126 U/L Final   Total Bilirubin 12/06/2021 0.6  0.3 - 1.2 mg/dL Final   GFR, Estimated 12/06/2021 >60  >60 mL/min Final   Comment: (NOTE) Calculated using the CKD-EPI Creatinine Equation (2021)    Anion gap 12/06/2021 9  5 - 15 Final   Performed at [facility], [place] [address], [place], [place] [address]   Alcohol, Ethyl (B) 12/06/2021 380 (HH)  <10 mg/dL Final   Comment: CRITICAL RESULT CALLED TO, READ BACK BY AND VERIFIED WITH GESELL, K @ 2003 [id number] JMK (NOTE) Lowest detectable limit for serum alcohol is  10 mg/dL.  For medical purposes only. Performed at [facility], [place] [address] [place], [place] [address]    WBC 12/06/2021 4.8  4.0 - 10.5 K/uL Final   RBC 12/06/2021 4.41  4.22 - 5.81 MIL/uL Final   Hemoglobin 12/06/2021 12.1 (L)   13.0 - 17.0 g/dL Final   HCT 12/06/2021 37.9 (L)  39.0 - 52.0 % Final   MCV 12/06/2021 85.9  80.0 - 100.0 fL Final   MCH 12/06/2021 27.4  26.0 - 34.0 pg Final   MCHC 12/06/2021 31.9  30.0 - 36.0 g/dL Final   RDW 12/06/2021 17.2 (H)  11.5 - 15.5 % Final   Platelets 12/06/2021 114 (L)  150 - 400 K/uL Final   nRBC 12/06/2021 0.0  0.0 - 0.2 % Final   Performed at [facility], [place] [address] [place], [place] [address]   Magnesium 12/06/2021 2.2  1.7 - 2.4 mg/dL Final   Performed at [place] [address] [place], [place] [address]  Admission on 11/18/2021, Discharged on 11/18/2021  Component Date Value Ref Range Status   WBC 11/18/2021 4.5  4.0 - 10.5 K/uL Final   RBC 11/18/2021 4.24  4.22 - 5.81 MIL/uL Final   Hemoglobin 11/18/2021 11.7 (L)  13.0 - 17.0 g/dL Final   HCT 11/18/2021 36.1 (L)  39.0 - 52.0 % Final   MCV 11/18/2021 85.1  80.0 - 100.0 fL Final   MCH 11/18/2021 27.6  26.0 - 34.0 pg Final   MCHC 11/18/2021 32.4  30.0 - 36.0 g/dL Final   RDW 11/18/2021 16.4 (H)  11.5 - 15.5 % Final   Platelets 11/18/2021 107 (L)  150 - 400 K/uL Final   nRBC 11/18/2021 0.0  0.0 - 0.2 % Final   Neutrophils Relative % 11/18/2021 43  % Final   Neutro Abs 11/18/2021 2.0  1.7 - 7.7 K/uL Final   Lymphocytes Relative 11/18/2021 40  % Final   Lymphs Abs 11/18/2021 1.8  0.7 - 4.0 K/uL Final   Monocytes Relative 11/18/2021 12  % Final   Monocytes Absolute 11/18/2021 0.5  0.1 - 1.0 K/uL Final   Eosinophils Relative 11/18/2021 3  % Final   Eosinophils Absolute 11/18/2021 0.1  0.0 - 0.5 K/uL Final   Basophils Relative 11/18/2021 2  % Final   Basophils Absolute 11/18/2021 0.1  0.0 - 0.1 K/uL Final   Immature Granulocytes 11/18/2021 0  % Final   Abs Immature Granulocytes 11/18/2021 0.01  0.00 - 0.07 K/uL Final   Performed at [facility], [place] [address], [place], [place] [address]   Sodium 11/18/2021 140  135 - 145 mmol/L Final   Potassium 11/18/2021 3.5   3.5 - 5.1 mmol/L Final   Chloride 11/18/2021 108  98 - 111 mmol/L Final   CO2 11/18/2021 21 (L)  22 - 32 mmol/L Final   Glucose, Bld 11/18/2021 90  70 - 99 mg/dL Final   Glucose reference range applies only to samples taken after fasting for at least 8 hours.   BUN 11/18/2021 <5 (L)  6 - 20 mg/dL Final   Creatinine, Ser 11/18/2021 0.71  0.61 - 1.24 mg/dL Final   Calcium 11/18/2021 8.4 (L)  8.9 - 10.3 mg/dL Final   Total Protein 11/18/2021 7.4  6.5 - 8.1 g/dL Final   Albumin 11/18/2021 3.7  3.5 - 5.0 g/dL Final   AST 11/18/2021 91 (H)  15 - 41 U/L Final   ALT 11/18/2021 48 (H)  0 - 44 U/L Final   Alkaline Phosphatase 11/18/2021 178 (H)  38 - 126 U/L Final   Total Bilirubin 11/18/2021 0.5  0.3 - 1.2 mg/dL Final   GFR, Estimated 11/18/2021 >60  >60 mL/min Final   Comment: (NOTE) Calculated using the CKD-EPI Creatinine Equation (2021)    Anion gap 11/18/2021 11  5 - 15 Final   Performed at [facility], [place] [address], [place], [place] [address]   Alcohol, Ethyl (B) 11/18/2021 336 (HH)  <10 mg/dL Final   Comment: CRITICAL RESULT CALLED TO, READ BACK BY AND VERIFIED WITH DESMOND JAY,RN AT 1455 11/18/2021 BY ZBEECH. (NOTE) Lowest detectable limit for serum alcohol is 10 mg/dL.  For medical purposes only. Performed at [facility], [place] [address] [place], [place] [address]    Glucose-Capillary 11/18/2021 98  70 - 99 mg/dL Final   Glucose reference range applies only to samples taken after fasting for at least 8 hours.   Comment 1 11/18/2021 Notify RN   Final   Comment 2 11/18/2021 Document in Chart   Final  Admission on 11/16/2021, Discharged on 11/16/2021  Component Date Value Ref Range Status   WBC 11/16/2021 4.1  4.0 - 10.5 K/uL Final   RBC 11/16/2021 4.20 (L)  4.22 - 5.81 MIL/uL Final   Hemoglobin 11/16/2021 11.5 (L)  13.0 - 17.0 g/dL Final   HCT 11/16/2021 35.3 (L)  39.0 - 52.0 % Final   MCV 11/16/2021 84.0  80.0 - 100.0 fL Final   MCH 11/16/2021 27.4  26.0 - 34.0  pg Final   MCHC 11/16/2021 32.6  30.0 - 36.0 g/dL Final   RDW 11/16/2021 16.3 (H)  11.5 - 15.5 % Final   Platelets 11/16/2021 127 (L)  150 - 400 K/uL Final   nRBC 11/16/2021 0.0  0.0 - 0.2 % Final   Neutrophils Relative % 11/16/2021 38  % Final   Neutro Abs 11/16/2021 1.5 (L)  1.7 - 7.7 K/uL Final   Lymphocytes Relative 11/16/2021 45  % Final   Lymphs Abs 11/16/2021 1.9  0.7 - 4.0 K/uL Final   Monocytes Relative 11/16/2021 12  % Final   Monocytes Absolute 11/16/2021 0.5  0.1 - 1.0 K/uL Final   Eosinophils Relative 11/16/2021 3  % Final   Eosinophils Absolute 11/16/2021 0.1  0.0 - 0.5 K/uL Final   Basophils Relative 11/16/2021 2  % Final   Basophils Absolute 11/16/2021 0.1  0.0 - 0.1 K/uL Final   Immature Granulocytes 11/16/2021 0  % Final   Abs Immature Granulocytes 11/16/2021 0.01  0.00 - 0.07 K/uL Final   Performed at [facility], [place] [address], [place], [place] [address]   Sodium 11/16/2021 140  135 - 145 mmol/L Final   Potassium 11/16/2021 3.7  3.5 - 5.1 mmol/L Final   Chloride 11/16/2021 107  98 - 111 mmol/L Final   CO2 11/16/2021 22  22 - 32 mmol/L Final   Glucose, Bld 11/16/2021 93  70 - 99 mg/dL Final   Glucose reference range applies only to samples taken after fasting for at least 8 hours.   BUN 11/16/2021 <5 (L)  6 - 20 mg/dL Final   Creatinine, Ser 11/16/2021 0.77  0.61 - 1.24 mg/dL Final   Calcium 11/16/2021 8.8 (L)  8.9 - 10.3 mg/dL Final   Total Protein 11/16/2021 7.8  6.5 - 8.1 g/dL Final   Albumin 11/16/2021 4.0  3.5 - 5.0 g/dL Final   AST 11/16/2021 103 (H)  15 - 41 U/L Final   ALT 11/16/2021 52 (H)  0 - 44 U/L Final   Alkaline Phosphatase 11/16/2021 197 (H)  38 - 126 U/L Final   Total Bilirubin 11/16/2021 0.4  0.3 - 1.2 mg/dL Final   GFR, Estimated 11/16/2021 >60  >60 mL/min Final   Comment: (NOTE) Calculated using the CKD-EPI Creatinine Equation (2021)    Anion gap 11/16/2021 11  5 - 15 Final   Performed at [facility], [place] [address],  [place], [place] [address]  Admission on 11/08/2021, Discharged on 11/09/2021  Component Date Value Ref Range Status   Glucose-Capillary 11/08/2021 90  70 - 99 mg/dL Final   Glucose reference range applies only to samples taken after fasting for at least 8 hours.  Admission on 11/02/2021, Discharged on 11/04/2021  Component Date Value Ref Range Status   Sodium 11/02/2021 139  135 - 145 mmol/L Final   Potassium 11/02/2021 4.1  3.5 - 5.1 mmol/L Final   Chloride 11/02/2021 108  98 - 111 mmol/L Final   CO2 11/02/2021 23  22 - 32 mmol/L Final   Glucose, Bld 11/02/2021 97  70 - 99 mg/dL Final   Glucose reference range applies only to samples taken after fasting for at least 8 hours.   BUN 11/02/2021 8  6 - 20 mg/dL Final   Creatinine, Ser 11/02/2021 0.66  0.61 - 1.24 mg/dL Final   Calcium 11/02/2021 8.5 (L)  8.9 - 10.3 mg/dL Final   Total Protein 11/02/2021 7.7  6.5 - 8.1 g/dL Final   Albumin 11/02/2021 3.8  3.5 - 5.0 g/dL Final   AST 11/02/2021 110 (H)  15 - 41 U/L Final   ALT 11/02/2021 64 (H)  0 - 44 U/L Final   Alkaline Phosphatase 11/02/2021 190 (H)  38 - 126 U/L Final   Total Bilirubin 11/02/2021 0.5  0.3 - 1.2 mg/dL Final   GFR, Estimated 11/02/2021 >60  >60 mL/min Final   Comment: (NOTE) Calculated using the CKD-EPI Creatinine Equation (2021)    Anion gap 11/02/2021 8  5 - 15 Final   Performed at [facility], [place] [address] [place], [place] [address]   Alcohol, Ethyl (B) 11/02/2021 303 (HH)  <10 mg/dL Final   Comment: Marina Gravel rn @ 2132 11/02/2021 ENGLAND, K (NOTE) Lowest detectable limit for serum alcohol is 10 mg/dL.  For medical purposes only. Performed at [facility], [place] [address], [place], [place] [address]    Salicylate Lvl 74/25/9563 <7.0 (L)  7.0 - 30.0 mg/dL Final   Performed at [place] [address] [place], [place] [address]   Acetaminophen (Tylenol), Serum 11/02/2021 <10 (L)  10 - 30 ug/mL  Final   Comment: (NOTE) Therapeutic concentrations vary significantly. A range of 10-30 ug/mL  may be an effective concentration for many patients. However, some  are best treated at concentrations outside of this range. Acetaminophen concentrations >150 ug/mL at 4 hours after ingestion  and >50 ug/mL at 12 hours after ingestion are often associated with  toxic reactions.  Performed at [facility], [place] [address] [place], [place] [address]    WBC 11/02/2021 6.3  4.0 - 10.5 K/uL Final   RBC 11/02/2021 3.61 (L)  4.22 - 5.81 MIL/uL Final   Hemoglobin 11/02/2021 10.0 (L)  13.0 - 17.0 g/dL Final   HCT 11/02/2021 31.4 (L)  39.0 - 52.0 % Final   MCV 11/02/2021 87.0  80.0 - 100.0  fL Final   MCH 11/02/2021 27.7  26.0 - 34.0 pg Final   MCHC 11/02/2021 31.8  30.0 - 36.0 g/dL Final   RDW 11/02/2021 18.4 (H)  11.5 - 15.5 % Final   Platelets 11/02/2021 218  150 - 400 K/uL Final   nRBC 11/02/2021 0.0  0.0 - 0.2 % Final   Performed at [facility], [place] [address], [place], [place] [address]   Opiates 11/02/2021 NONE DETECTED  NONE DETECTED Final   Cocaine 11/02/2021 NONE DETECTED  NONE DETECTED Final   Benzodiazepines 11/02/2021 NONE DETECTED  NONE DETECTED Final   Amphetamines 11/02/2021 NONE DETECTED  NONE DETECTED Final   Tetrahydrocannabinol 11/02/2021 NONE DETECTED  NONE DETECTED Final   Barbiturates 11/02/2021 POSITIVE (A)  NONE DETECTED Final   Comment: (NOTE) DRUG SCREEN FOR MEDICAL PURPOSES ONLY.  IF CONFIRMATION IS NEEDED FOR ANY PURPOSE, NOTIFY LAB WITHIN 5 DAYS.  LOWEST DETECTABLE LIMITS FOR URINE DRUG SCREEN Drug Class                     Cutoff (ng/mL) Amphetamine and metabolites    1000 Barbiturate and metabolites    200 Benzodiazepine                 127 Tricyclics and metabolites     300 Opiates and metabolites        300 Cocaine and metabolites        300 THC                            50 Performed at [facility], [place] [address], [place], [place] [address]   Admission on 10/25/2021, Discharged on 10/28/2021  Component Date Value Ref Range Status   Sodium 10/25/2021 136  135 - 145 mmol/L Final   Potassium 10/25/2021 3.3 (L)  3.5 - 5.1 mmol/L Final   Chloride 10/25/2021 102  98 - 111 mmol/L Final   CO2 10/25/2021 24  22 - 32 mmol/L Final   Glucose, Bld 10/25/2021 102 (H)  70 - 99 mg/dL Final   Glucose reference range applies only to samples taken after fasting for at least 8 hours.   BUN 10/25/2021 <5 (L)  6 - 20 mg/dL Final   Creatinine, Ser 10/25/2021 0.73  0.61 - 1.24 mg/dL Final   Calcium 10/25/2021 9.1  8.9 - 10.3 mg/dL Final   Total Protein 10/25/2021 7.5  6.5 - 8.1 g/dL Final   Albumin 10/25/2021 3.9  3.5 - 5.0 g/dL Final   AST 10/25/2021 194 (H)  15 - 41 U/L Final   ALT 10/25/2021 62 (H)  0 - 44 U/L Final   Alkaline Phosphatase 10/25/2021 166 (H)  38 - 126 U/L Final   Total Bilirubin 10/25/2021 0.9  0.3 - 1.2 mg/dL Final   GFR, Estimated 10/25/2021 >60  >60 mL/min Final   Comment: (NOTE) Calculated using the CKD-EPI Creatinine Equation (2021)    Anion gap 10/25/2021 10  5 - 15 Final   Performed at [facility], [place] [address] [place], [place] [address]   Alcohol, Ethyl (B) 10/25/2021 359 (HH)  <10 mg/dL Final   Comment: CRITICAL RESULT CALLED TO, READ BACK BY AND VERIFIED WITH Elige Radon, RN, 2251, 10/25/21, EADEDOKUN (NOTE) Lowest detectable limit for serum alcohol is 10 mg/dL.  For medical purposes only. Performed at [facility], [place] [address], [place], [place] [address]  WBC 10/25/2021 5.8  4.0 - 10.5 K/uL Final   RBC 10/25/2021 3.72 (L)  4.22 - 5.81 MIL/uL Final   Hemoglobin 10/25/2021 10.3 (L)  13.0 - 17.0 g/dL Final   HCT 10/25/2021 31.5 (L)  39.0 - 52.0 % Final   MCV 10/25/2021 84.7  80.0 - 100.0 fL Final   MCH 10/25/2021 27.7  26.0 - 34.0 pg Final   MCHC 10/25/2021 32.7  30.0 - 36.0 g/dL Final   RDW 10/25/2021 18.0 (H)  11.5 - 15.5 % Final    Platelets 10/25/2021 91 (L)  150 - 400 K/uL Final   REPEATED TO VERIFY   nRBC 10/25/2021 0.0  0.0 - 0.2 % Final   Performed at [facility], [place] [address], [place], [place] [address]   Opiates 10/25/2021 NONE DETECTED  NONE DETECTED Final   Cocaine 10/25/2021 NONE DETECTED  NONE DETECTED Final   Benzodiazepines 10/25/2021 NONE DETECTED  NONE DETECTED Final   Amphetamines 10/25/2021 NONE DETECTED  NONE DETECTED Final   Tetrahydrocannabinol 10/25/2021 NONE DETECTED  NONE DETECTED Final   Barbiturates 10/25/2021 POSITIVE (A)  NONE DETECTED Final   Comment: (NOTE) DRUG SCREEN FOR MEDICAL PURPOSES ONLY.  IF CONFIRMATION IS NEEDED FOR ANY PURPOSE, NOTIFY LAB WITHIN 5 DAYS.  LOWEST DETECTABLE LIMITS FOR URINE DRUG SCREEN Drug Class                     Cutoff (ng/mL) Amphetamine and metabolites    1000 Barbiturate and metabolites    200 Benzodiazepine                 034 Tricyclics and metabolites     300 Opiates and metabolites        300 Cocaine and metabolites        300 THC                            50 Performed at [facility], [place] [address] [place], [place] [address]    Fecal Occult Bld 10/26/2021 NEGATIVE  NEGATIVE Final  Admission on 10/23/2021, Discharged on 10/23/2021  Component Date Value Ref Range Status   Sodium 10/23/2021 138  135 - 145 mmol/L Final   Potassium 10/23/2021 4.1  3.5 - 5.1 mmol/L Final   Chloride 10/23/2021 103  98 - 111 mmol/L Final   CO2 10/23/2021 25  22 - 32 mmol/L Final   Glucose, Bld 10/23/2021 130 (H)  70 - 99 mg/dL Final   Glucose reference range applies only to samples taken after fasting for at least 8 hours.   BUN 10/23/2021 7  6 - 20 mg/dL Final   Creatinine, Ser 10/23/2021 0.71  0.61 - 1.24 mg/dL Final   Calcium 10/23/2021 9.0  8.9 - 10.3 mg/dL Final   GFR, Estimated 10/23/2021 >60  >60 mL/min Final   Comment: (NOTE) Calculated using the CKD-EPI Creatinine Equation (2021)    Anion gap 10/23/2021 10  5 - 15 Final    Performed at [facility], [place] [address] [place], [place] [address]   WBC 10/23/2021 7.2  4.0 - 10.5 K/uL Final   RBC 10/23/2021 4.45  4.22 - 5.81 MIL/uL Final   Hemoglobin 10/23/2021 12.2 (L)  13.0 - 17.0 g/dL Final   HCT 10/23/2021 38.0 (L)  39.0 - 52.0 % Final   MCV 10/23/2021 85.4  80.0 - 100.0 fL Final   MCH 10/23/2021 27.4  26.0 -  34.0 pg Final   MCHC 10/23/2021 32.1  30.0 - 36.0 g/dL Final   RDW 10/23/2021 18.4 (H)  11.5 - 15.5 % Final   Platelets 10/23/2021 96 (L)  150 - 400 K/uL Final   Comment: SPECIMEN CHECKED FOR CLOTS Immature Platelet Fraction may be clinically indicated, consider ordering this additional test [id number] REPEATED TO VERIFY PLATELET COUNT CONFIRMED BY SMEAR    nRBC 10/23/2021 0.0  0.0 - 0.2 % Final   Neutrophils Relative % 10/23/2021 78  % Final   Neutro Abs 10/23/2021 5.5  1.7 - 7.7 K/uL Final   Lymphocytes Relative 10/23/2021 9  % Final   Lymphs Abs 10/23/2021 0.7  0.7 - 4.0 K/uL Final   Monocytes Relative 10/23/2021 12  % Final   Monocytes Absolute 10/23/2021 0.9  0.1 - 1.0 K/uL Final   Eosinophils Relative 10/23/2021 0  % Final   Eosinophils Absolute 10/23/2021 0.0  0.0 - 0.5 K/uL Final   Basophils Relative 10/23/2021 1  % Final   Basophils Absolute 10/23/2021 0.0  0.0 - 0.1 K/uL Final   Immature Granulocytes 10/23/2021 0  % Final   Abs Immature Granulocytes 10/23/2021 0.03  0.00 - 0.07 K/uL Final   Performed at [facility], [place] [address], [place], [place] [address]   Prothrombin Time 10/23/2021 12.8  11.4 - 15.2 seconds Final   INR 10/23/2021 1.0  0.8 - 1.2 Final   Comment: (NOTE) INR goal varies based on device and disease states. Performed at [facility], [place] [address], [place], [place] [address]    Glucose-Capillary 10/23/2021 111 (H)  70 - 99 mg/dL Final   Glucose reference range applies only to samples taken after fasting for at least 8 hours.  There may be more visits with  results that are not included.    Allergies: Tomato and Aspirin  PTA Medications: (Not in a hospital admission)   Long Term Goals: Improvement in symptoms so as ready for discharge  Short Term Goals: Patient will verbalize feelings in meetings with treatment team members., Patient will attend at least of 50% of the groups daily., Pt will complete the PHQ9 on admission, day 3 and discharge., and Patient will take medications as prescribed daily.  Medical Decision Making  [facility] admission Pt is voluntary No medication changes at this time  Recommendations  Based on my evaluation the patient does not appear to have an emergency medical condition.  Tharon Aquas, NP 01/20/22  9:31 AM

## 2022-01-20 NOTE — ED Notes (Signed)
Pt given a snack and escorted to [facility].

## 2022-01-20 NOTE — ED Notes (Signed)
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  Last drink according to patient ws yesterday prior to arriving at facility.  Patient is calm, cooperative and pleasant on approach.  Patient is disheveled and malodorous.  He was given toiletries and towels and encouraged to attend to ADL/s.  Patient oriented to unit and room.  He denied avh shi or plan.  Encouraged to seek staff to have needs met.  Will monitor and provide safe environment.

## 2022-01-21 ENCOUNTER — Encounter (HOSPITAL_COMMUNITY): Payer: Self-pay | Admitting: Psychiatry

## 2022-01-21 DIAGNOSIS — F10129 Alcohol abuse with intoxication, unspecified: Secondary | ICD-10-CM | POA: Diagnosis not present

## 2022-01-21 DIAGNOSIS — Z1152 Encounter for screening for COVID-19: Secondary | ICD-10-CM | POA: Diagnosis not present

## 2022-01-21 DIAGNOSIS — Z5900 Homelessness unspecified: Secondary | ICD-10-CM | POA: Diagnosis not present

## 2022-01-21 MED ORDER — VITAMIN B-1 100 MG PO TABS: 100.0000 mg | ORAL_TABLET | Freq: Every day | ORAL | 0 refills | Status: DC

## 2022-01-21 MED ORDER — ADULT MULTIVITAMIN W/MINERALS CH: 1.0000 | ORAL_TABLET | Freq: Every day | ORAL | 0 refills | Status: DC

## 2022-01-21 MED ORDER — GABAPENTIN 300 MG PO CAPS: 300.0000 mg | ORAL_CAPSULE | Freq: Three times a day (TID) | ORAL | 0 refills | Status: DC

## 2022-01-21 NOTE — ED Notes (Signed)
Patient is awake and alert. He has requested discharge from facility due to his sister being ill and patient feels he needs to be able to attend to her.  Patient informed that MD will be notified when she arrives at facility.  Patient accepting of that plan.  He remains on an ativan taper however he does not exhibit etoh withdrawal signs or symptoms at this time.  Will continue to monitor and provide support and safety while on unit.

## 2022-01-21 NOTE — ED Provider Notes (Signed)
FBC/OBS ASAP Discharge Summary  Date and Time: 01/21/2022, 11:09 AM  Name: Barry Moore  Age: 56 y.o.  DOB: 17-Feb-1966  MRN:  [id number]   Discharge Diagnoses:  Final diagnoses:  Alcohol abuse with intoxication ([place])    HPI:  Per H&P: " [place]: Pt presents voluntarily to [facility] behavioral health for walk-in assessment.  Pt is escorted by [employer]. Pt is assessed face-to-face by nurse practitioner.    Barry Moore, 56 y.o., male patient seen face to face by this provider, consulted with Dr. Dwyane Dee; and chart reviewed on 01/19/22. On evaluation Barry Moore reports he is presenting today for "detox". Pt presents disheveled, malodorous, with slurred speech. Pt appears to be inebriated during assessment. Pt reports drinking "6 40s of beer" this morning. When asked when his last drink was, pt states he does not know. He presents as poor historian. Pt is oriented to situation, place, person; oriented to month; states he does not know year. 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    Pt reports depressed,dysphoric mood and passive SI w/o plan or intent. Pt denies HI/VI. Pt denies AVH, paranoia.   Pt reports he has been homeless for the past 25 years.    Pt denies access to a firearm or other weapon.    Pt reports highest level of education is the 11th grade.   Pt is not currently employed.   Pt denies knowledge of family psychiatric hx.    Pt w/ hx of inpatient psychiatric hospitalization, last [facility] 07/16/2021-07/23/2021, due to homicidal ideation towards his brother in law.   Pt reports hx of 1 SA 10 years ago, OD on medications. Pt denies hx of NSSIB.   Pt not currently connected with medication management or counseling.    Pt's caseworker intern, Overton Mam, from congregational nursing presents to facility. Collateral obtained w/ pt verbal consent. Per [facility], pt presented inebriated during session. Pt endorsed passive SI, w/o plan. When [facility] asked if he  did have a plan what it would be, pt reported pt would jump in front of a train. Pt had told her "he didn't want to do it today".    Pt to be admitted to continuous assessment and to be re-evaluated tomorrow.    Pt reports he feels he is currently in withdrawal. He reports nausea and tremors. Fine distal tremor noted b/l on hands/fingers.  FBC: On reassessment, pt is a&ox3, in no acute distress, non-toxic appearing. He reports he continues to be interested in detox. He reports he drank 5 or 6 40z yesterday, which he believes is "overdoing it". Pt reports he usually drinks 2 or 3 40oz. When asked about reported withdrawal related seizures and DTs yesterday, pt denies seizures or DTs, reports he does get bad tremors when withdrawing. He denies he has ever been hospitalized for withdrawal related seizures or DTs. He reports current withdrawal sx include sweating, nausea, dizziness, and tremor. He denies fever, chills, cp, palpitations, sob, abd pain, vomiting, diarrhea. Fine distal tremor noted b/l on pt's hands and fingers.    Pt is interested in detox and then residential substance use treatment. Discussed transfer to [facility] for detox and linkage to residential substance use treatment. Pt agrees with plan. "  Subjective:  Patient was initially seen this AM, ambulating around the day room, steady, no acute distress. Stated that wanted to be discharged to see his sister that is sick. Patient had no questions or concerns. Denied somatic side effects per ROS and  denied side effects to medications.   WL:SLHTDSKA Thoughts: No (Contracted to safety) JG:OTLXBWIOM Thoughts: No BTD:HRCBULAGTXMIWO: None Ideas of EHO:ZYYQ   Mood: Depressed Sleep:Good Appetite: Good  Review of Systems  Respiratory:  Negative for shortness of breath.   Cardiovascular:  Negative for chest pain.  Gastrointestinal:  Negative for nausea and vomiting.  Neurological:  Negative for dizziness and headaches.    Stay Summary:   Barry Moore is a 56 y.o. male with PMH of AUD (no sz or DT seen in chart), housing instability, who presented voluntary to [facility] (01/19/2022) then admitted to [facility] on the same day for treatment of AUD and assistance with residential placement.  Total duration of encounter: 2 days   Patient was discharged after one night due to sister being sick. Patient was resumed and discharged on his home medications. Bus pass and food provided to help get patient there. Written scripts provided.  There were no acute behavioral concerns, no agitation prns required.   AUD (no sz or DT) BAL was 290. AST/ALT 140/68, down trending from last labs. No severe withdrawal sxs.  Gabapentin started for EtOH craving   Normocytic anemia Hb 10.8.   NEW medications started  gabapentin 300 MG capsule; Commonly known as: NEURONTIN; Take 1 capsule  (300 mg total) by mouth 3 (three) times daily.  multivitamin with minerals Tabs tablet; Take 1 tablet by mouth daily.  thiamine 100 MG tablet; Commonly known as: Vitamin B-1; Take 1 tablet  (100 mg total) by mouth daily.   Home medications continued  citalopram 10 MG tablet; Commonly known as: CELEXA; Take 1 tablet (10 mg  total) by mouth daily.    While future psychiatric events cannot be accurately predicted, the patient does not currently require acute inpatient psychiatric care and does not currently meet [facility] involuntary commitment criteria.  Past Psychiatric History: Per H&P Past Medical History:  Past Medical History:  Diagnosis Date   Alcoholic hepatitis    Depression    ETOH abuse    Gastric bleed 08/2018   Hypertension    Pancreatitis    Seizures ([place])    Suicidal behavior     Past Surgical History:  Procedure Laterality Date   BIOPSY  08/05/2018   Procedure: BIOPSY;  Surgeon: Irving Copas., MD;  Location: [facility] ENDOSCOPY;  Service: Gastroenterology;;   BIOPSY  08/07/2018   Procedure: BIOPSY;  Surgeon: Thornton Park, MD;   Location: [facility] ENDOSCOPY;  Service: Gastroenterology;;   BIOPSY  01/02/2019   Procedure: BIOPSY;  Surgeon: Gatha Mayer, MD;  Location: [facility] ENDOSCOPY;  Service: Endoscopy;;   COLONOSCOPY WITH PROPOFOL N/A 08/07/2018   Procedure: COLONOSCOPY WITH PROPOFOL;  Surgeon: Thornton Park, MD;  Location: [facility];  Service: Gastroenterology;  Laterality: N/A;   ESOPHAGOGASTRODUODENOSCOPY N/A 01/02/2019   Procedure: ESOPHAGOGASTRODUODENOSCOPY (EGD);  Surgeon: Gatha Mayer, MD;  Location: [facility] ENDOSCOPY;  Service: Endoscopy;  Laterality: N/A;   ESOPHAGOGASTRODUODENOSCOPY N/A 07/03/2019   Procedure: ESOPHAGOGASTRODUODENOSCOPY (EGD);  Surgeon: Wonda Horner, MD;  Location: [facility] ENDOSCOPY;  Service: Endoscopy;  Laterality: N/A;   ESOPHAGOGASTRODUODENOSCOPY N/A 06/26/2020   Procedure: ESOPHAGOGASTRODUODENOSCOPY (EGD);  Surgeon: Arta Silence, MD;  Location: [facility] ENDOSCOPY;  Service: Endoscopy;  Laterality: N/A;   ESOPHAGOGASTRODUODENOSCOPY (EGD) WITH PROPOFOL N/A 11/02/2016   Procedure: ESOPHAGOGASTRODUODENOSCOPY (EGD) WITH PROPOFOL;  Surgeon: Carol Ada, MD;  Location: [facility] ENDOSCOPY;  Service: Endoscopy;  Laterality: N/A;   ESOPHAGOGASTRODUODENOSCOPY (EGD) WITH PROPOFOL N/A 08/05/2018   Procedure: ESOPHAGOGASTRODUODENOSCOPY (EGD) WITH PROPOFOL;  Surgeon: Irving Copas.,  MD;  Location: [facility];  Service: Gastroenterology;  Laterality: N/A;   ESOPHAGOGASTRODUODENOSCOPY (EGD) WITH PROPOFOL N/A 07/14/2019   Procedure: ESOPHAGOGASTRODUODENOSCOPY (EGD) WITH PROPOFOL;  Surgeon: Otis Brace, MD;  Location: [facility] ENDOSCOPY;  Service: Gastroenterology;  Laterality: N/A;   HERNIA REPAIR     LEG SURGERY     POLYPECTOMY  08/07/2018   Procedure: POLYPECTOMY;  Surgeon: Thornton Park, MD;  Location: [facility] ENDOSCOPY;  Service: Gastroenterology;;   Family History:  Family History  Problem Relation Age of Onset   Diabetes Mother    Alcoholism Mother    Emphysema Father    Lung cancer Father     Alcoholism Father    Family Psychiatric History: Per H&P Social History:  Social History   Substance and Sexual Activity  Alcohol Use Yes   Comment: 5+ BEERS DAILY, Patient does not know how much he drank tonight     Social History   Substance and Sexual Activity  Drug Use Not Currently   Frequency: 3.0 times per week   Types: Cocaine    Social History   Socioeconomic History   Marital status: Divorced    Spouse name: Not on file   Number of children: Not on file   Years of education: Not on file   Highest education level: Not on file  Occupational History   Not on file  Tobacco Use   Smoking status: Every Day    Packs/day: 1.00    Types: Cigarettes   Smokeless tobacco: Never  Vaping Use   Vaping Use: Never used  Substance and Sexual Activity   Alcohol use: Yes    Comment: 5+ BEERS DAILY, Patient does not know how much he drank tonight   Drug use: Not Currently    Frequency: 3.0 times per week    Types: Cocaine   Sexual activity: Not Currently  Other Topics Concern   Not on file  Social History Narrative   Not on file   Social Determinants of Health   Financial Resource Strain: Not on file  Food Insecurity: Not on file  Transportation Needs: Not on file  Physical Activity: Not on file  Stress: Not on file  Social Connections: Not on file   SDOH:  SDOH Screenings   Alcohol Screen: High Risk (07/16/2021)  Depression (PHQ2-9): High Risk (08/08/2021)  Tobacco Use: High Risk (01/21/2022)   Tobacco Cessation:  N/A, patient does not currently use tobacco products  Current Medications:  Current Facility-Administered Medications  Medication Dose Route Frequency Provider Last Rate Last Admin   acetaminophen (TYLENOL) tablet 650 mg  650 mg Oral Q6H PRN Tharon Aquas, NP   650 mg at 01/19/22 1753   alum & mag hydroxide-simeth (MAALOX/MYLANTA) 200-200-20 MG/5ML suspension 30 mL  30 mL Oral Q4H PRN Tharon Aquas, NP       gabapentin (NEURONTIN) capsule  300 mg  300 mg Oral TID Tharon Aquas, NP   300 mg at 01/21/22 0855   hydrOXYzine (ATARAX) tablet 25 mg  25 mg Oral Q6H PRN Tharon Aquas, NP       loperamide (IMODIUM) capsule 2-4 mg  2-4 mg Oral PRN Tharon Aquas, NP       LORazepam (ATIVAN) tablet 1 mg  1 mg Oral Q6H PRN Tharon Aquas, NP       LORazepam (ATIVAN) tablet 1 mg  1 mg Oral BID Tharon Aquas, NP       Followed by   Derrill Memo ON 01/23/2022] LORazepam (ATIVAN)  tablet 1 mg  1 mg Oral Daily Tharon Aquas, NP       magnesium hydroxide (MILK OF MAGNESIA) suspension 30 mL  30 mL Oral Daily PRN Tharon Aquas, NP       multivitamin with minerals tablet 1 tablet  1 tablet Oral Daily Tharon Aquas, NP   1 tablet at 01/21/22 0855   ondansetron (ZOFRAN-ODT) disintegrating tablet 4 mg  4 mg Oral Q6H PRN Tharon Aquas, NP       thiamine (VITAMIN B1) tablet 100 mg  100 mg Oral Daily Tharon Aquas, NP   100 mg at 01/21/22 0854   traZODone (DESYREL) tablet 50 mg  50 mg Oral QHS PRN Tharon Aquas, NP       Current Outpatient Medications  Medication Sig Dispense Refill   citalopram (CELEXA) 10 MG tablet Take 1 tablet (10 mg total) by mouth daily. 7 tablet 0   gabapentin (NEURONTIN) 300 MG capsule Take 1 capsule (300 mg total) by mouth 3 (three) times daily. 90 capsule 0   Multiple Vitamin (MULTIVITAMIN WITH MINERALS) TABS tablet Take 1 tablet by mouth daily. 30 tablet 0   thiamine (VITAMIN B-1) 100 MG tablet Take 1 tablet (100 mg total) by mouth daily. 30 tablet 0    PTA Medications: (Not in a hospital admission)     08/08/2021    6:02 AM 09/05/2020    2:56 AM 06/18/2020    4:19 AM  Depression screen PHQ 2/9  Decreased Interest '2 2 2  '$ Down, Depressed, Hopeless '2 2 2  '$ PHQ - 2 Score '4 4 4  '$ Altered sleeping '1 2 3  '$ Tired, decreased energy '1 2 2  '$ Change in appetite '1 2 3  '$ Feeling bad or failure about yourself  '2 2 2  '$ Trouble concentrating '2 2 3  '$ Moving slowly or fidgety/restless '1  2 2  '$ Suicidal thoughts '2 2 2  '$ PHQ-9 Score '14 18 21  '$ Difficult doing work/chores Very difficult Very difficult Very difficult    Flowsheet Row ED from 01/19/2022 in [facility] ED from 01/11/2022 in [place] ED from 12/08/2021 in [place] DEPT  C-SSRS RISK CATEGORY No Risk No Risk High Risk       Musculoskeletal  Strength & Muscle Tone: within normal limits Gait & Station: normal Patient leans: N/A   Psychiatric Specialty Exam   Presentation  General Appearance:Disheveled Eye Contact:Good Speech:Clear and Coherent, Normal Rate Volume:Normal Handedness:Right  Mood and Affect  Mood:Depressed Affect:Congruent, Appropriate, Constricted, Depressed (Tearful during interview when talking about kids)  Thought Process  Thought Process:Coherent, Goal Directed, Linear Descriptions of Associations:Intact  Thought Content Suicidal Thoughts:Suicidal Thoughts: No (Contracted to safety) Homicidal Thoughts:Homicidal Thoughts: No Hallucinations:Hallucinations: None Ideas of Reference:None Thought Content:WDL  Sensorium  Memory:Immediate Fair Judgment:Fair Insight:Fair  Executive Functions  Orientation:Full (Time, Place and Person) Language:Good Concentration:Good [place] of Knowledge:Good  Psychomotor Activity  Psychomotor Activity:Psychomotor Activity: Normal  Assets  Assets:Communication Skills, Desire for Improvement, Resilience  Sleep  Quality:Good  Physical Exam  BP (!) 148/78 (BP Location: Right Arm)   Pulse 85   Temp 98.1 F (36.7 C) (Tympanic)   Resp 18   SpO2 100%   Physical Exam Vitals and nursing note reviewed.  Constitutional:      General: He is not in acute distress.    Appearance: He is not ill-appearing, toxic-appearing or diaphoretic.  HENT:     Head: Normocephalic.  Pulmonary:  Effort: Pulmonary effort is normal.  No respiratory distress.  Neurological:     Mental Status: He is alert.    Demographic Factors:  Male, Caucasian, Low socioeconomic status, Living alone, and Unemployed  Loss Factors: Financial problems/change in socioeconomic status  Historical Factors: Impulsivity  Risk Reduction Factors:   Sense of responsibility to family  Continued Clinical Symptoms:  Alcohol/Substance Abuse/Dependencies  Cognitive Features That Contribute To Risk:  Loss of executive function and Thought constriction (tunnel vision)    Suicide Risk:  Mild:  Suicidal ideation of limited frequency, intensity, duration, and specificity.  There are no identifiable plans, no associated intent, mild dysphoria and related symptoms, good self-control (both objective and subjective assessment), few other risk factors, and identifiable protective factors, including available and accessible social support.  Plan Of Care/Follow-up recommendations:  Activity and diet at tolerated.  Please: Take all medications as prescribed by your mental healthcare provider. Report any adverse effects and or reactions from the medicines to your outpatient provider promptly. Do not engage in alcohol and or illegal drug use while on prescription medicines.  Disposition: Home with bus pass to sister   Total Time spent with patient: 15 minutes  Signed: Merrily Brittle, DO Psychiatry Resident, PGY-2 [facility]/FBC 01/21/2022, 11:09 AM

## 2022-01-22 ENCOUNTER — Emergency Department (HOSPITAL_COMMUNITY)
Admission: EM | Admit: 2022-01-22 | Discharge: 2022-01-23 | Disposition: A | Discharge status: Home / Self Care | Payer: Medicaid Other | Attending: Emergency Medicine | Admitting: Emergency Medicine

## 2022-01-22 ENCOUNTER — Other Ambulatory Visit: Payer: Self-pay

## 2022-01-22 ENCOUNTER — Encounter (HOSPITAL_COMMUNITY): Payer: Self-pay | Admitting: *Deleted

## 2022-01-22 DIAGNOSIS — F10220 Alcohol dependence with intoxication, uncomplicated: Secondary | ICD-10-CM | POA: Insufficient documentation

## 2022-01-22 DIAGNOSIS — Y908 Blood alcohol level of 240 mg/100 ml or more: Secondary | ICD-10-CM | POA: Insufficient documentation

## 2022-01-22 DIAGNOSIS — R569 Unspecified convulsions: Secondary | ICD-10-CM | POA: Insufficient documentation

## 2022-01-22 LAB — URINALYSIS, ROUTINE W REFLEX MICROSCOPIC
Bilirubin Urine: NEGATIVE
Glucose, UA: NEGATIVE mg/dL
Hgb urine dipstick: NEGATIVE
Ketones, ur: NEGATIVE mg/dL
Leukocytes,Ua: NEGATIVE
Nitrite: NEGATIVE
Protein, ur: NEGATIVE mg/dL
Specific Gravity, Urine: 1.002 — ABNORMAL LOW (ref 1.005–1.030)
pH: 5 (ref 5.0–8.0)

## 2022-01-22 LAB — COMPREHENSIVE METABOLIC PANEL
ALT: 115 U/L — ABNORMAL HIGH (ref 0–44)
AST: 216 U/L — ABNORMAL HIGH (ref 15–41)
Albumin: 4.1 g/dL (ref 3.5–5.0)
Alkaline Phosphatase: 186 U/L — ABNORMAL HIGH (ref 38–126)
Anion gap: 16 — ABNORMAL HIGH (ref 5–15)
BUN: 8 mg/dL (ref 6–20)
CO2: 21 mmol/L — ABNORMAL LOW (ref 22–32)
Calcium: 9.7 mg/dL (ref 8.9–10.3)
Chloride: 102 mmol/L (ref 98–111)
Creatinine, Ser: 0.72 mg/dL (ref 0.61–1.24)
GFR, Estimated: >60 mL/min (ref >60)
Glucose, Bld: 105 mg/dL — ABNORMAL HIGH (ref 70–99)
Potassium: 4.2 mmol/L (ref 3.5–5.1)
Sodium: 139 mmol/L (ref 135–145)
Total Bilirubin: 0.5 mg/dL (ref 0.3–1.2)
Total Protein: 8 g/dL (ref 6.5–8.1)

## 2022-01-22 LAB — CBC WITH DIFFERENTIAL/PLATELET
Abs Immature Granulocytes: 0.02 10*3/uL (ref 0.00–0.07)
Basophils Absolute: 0.1 10*3/uL (ref 0.0–0.1)
Basophils Relative: 1 %
Eosinophils Absolute: 0.1 10*3/uL (ref 0.0–0.5)
Eosinophils Relative: 2 %
HCT: 35.1 % — ABNORMAL LOW (ref 39.0–52.0)
Hemoglobin: 10.9 g/dL — ABNORMAL LOW (ref 13.0–17.0)
Immature Granulocytes: 0 %
Lymphocytes Relative: 38 %
Lymphs Abs: 2.2 10*3/uL (ref 0.7–4.0)
MCH: 26.7 pg (ref 26.0–34.0)
MCHC: 31.1 g/dL (ref 30.0–36.0)
MCV: 85.8 fL (ref 80.0–100.0)
Monocytes Absolute: 0.9 10*3/uL (ref 0.1–1.0)
Monocytes Relative: 16 %
Neutro Abs: 2.5 10*3/uL (ref 1.7–7.7)
Neutrophils Relative %: 43 %
Platelets: 192 10*3/uL (ref 150–400)
RBC: 4.09 MIL/uL — ABNORMAL LOW (ref 4.22–5.81)
RDW: 16.5 % — ABNORMAL HIGH (ref 11.5–15.5)
WBC: 5.9 10*3/uL (ref 4.0–10.5)
nRBC: 0 % (ref 0.0–0.2)

## 2022-01-22 LAB — RAPID URINE DRUG SCREEN, HOSP PERFORMED
Amphetamines: NOT DETECTED
Barbiturates: NOT DETECTED
Benzodiazepines: NOT DETECTED
Cocaine: NOT DETECTED
Opiates: NOT DETECTED
Tetrahydrocannabinol: NOT DETECTED

## 2022-01-22 LAB — ETHANOL: Alcohol, Ethyl (B): 340 mg/dL (ref <10)

## 2022-01-22 MED ORDER — LORAZEPAM 2 MG/ML IJ SOLN
1.0000 mg | Freq: Once | INTRAMUSCULAR | Status: AC
  Administered 2022-01-22: 1 mg via INTRAVENOUS

## 2022-01-22 MED ORDER — LEVETIRACETAM IN NACL 500 MG/100ML IV SOLN
500.0000 mg | Freq: Once | INTRAVENOUS | Status: AC
  Administered 2022-01-22: 500 mg via INTRAVENOUS
  Filled 2022-01-22: qty 100

## 2022-01-22 MED ORDER — FOLIC ACID 1 MG PO TABS
1.0000 mg | ORAL_TABLET | Freq: Every day | ORAL | Status: DC
  Administered 2022-01-22: 1 mg via ORAL
  Filled 2022-01-22: qty 1

## 2022-01-22 MED ORDER — ADULT MULTIVITAMIN W/MINERALS CH
1.0000 | ORAL_TABLET | Freq: Every day | ORAL | Status: DC
  Administered 2022-01-22: 1 via ORAL
  Filled 2022-01-22: qty 1

## 2022-01-22 MED ORDER — THIAMINE MONONITRATE 100 MG PO TABS
100.0000 mg | ORAL_TABLET | Freq: Every day | ORAL | Status: DC
  Administered 2022-01-22: 100 mg via ORAL
  Filled 2022-01-22: qty 1

## 2022-01-22 MED ORDER — LORAZEPAM 2 MG/ML IJ SOLN
2.0000 mg | Freq: Once | INTRAMUSCULAR | Status: DC
  Filled 2022-01-22: qty 1

## 2022-01-22 MED ORDER — THIAMINE HCL 100 MG/ML IJ SOLN
100.0000 mg | Freq: Every day | INTRAMUSCULAR | Status: DC
  Filled 2022-01-22: qty 1

## 2022-01-22 NOTE — ED Triage Notes (Signed)
Pt called EMS for seizures today, history of seizures. On arrival to scene, pt was not posticial, alert and oriented. He was monitored en route. 134/80, hr 98, 92, cbg 165. Seizure on arrival last 15-20 seconds, reported by EMS and witnessed by PA. Unsure of when his last drink was.

## 2022-01-22 NOTE — Discharge Instructions (Signed)
Intensive Outpatient Programs   [facility]                                  [facility] [address]                                                       [place] [address] [place],  [place], [place]                                                              [place]         (Inpatient and outpatient)                                           [phone] (Suboxone and Methadone) [address]                                                                                                                 [phone]                                                                                                     [facility]                                               [facility] - Intensive Outpatient [address]  [address] [place], [place] [address]                                                 [place], [place]                                                              [phone]   [facility] (Outpatient, Inpatient, Chemical       [facility] (Groups and Residental) (insurance only) [phone]                                              [place], [place]                                                    [facility]                                       [facility] (for caregivers and family) [address]                                                    [address] [place], [place], [place]                                                               [phone]   Residential Treatment Programs   [place]  [facility](2 years) Residential: 90 days)                 [facility] [place], [place], [place]                                                              [phone] or [phone]   [facility]                                              [facility] [address]                                                               [address] [place], [place], [place]                                                              [phone]   [place]                                [facility] ([facility]) [place]                                                           [address] [place], [place] [address]  [place], [place]                                                              [phone] Admissions: 8am-3pm M-F   [facility]: Residential Program [phone] Days)                     [facility]: [place], [place], [place] or [phone]                                             (Walk in Hours over the weekend or by referral)     [facility]: [facility]:[phone] (for crisis response 24 hours a day)     Last Modified by Coralee Pesa, LCSWA at 12/06/20 1447

## 2022-01-22 NOTE — ED Triage Notes (Signed)
Pt reporting he is suicidal, "just not sure what I would do yet". C/o back pain, asking for [employer]. Alert and answering questions.

## 2022-01-22 NOTE — ED Provider Notes (Signed)
[facility] EM[facility]MENT Provider Note   CSN: [id number] Arrival date & time: 01/22/22  2126     History {Add pertinent medical, surgical, social history, OB history to HPI:1} Chief Complaint  Patient presents with   Seizures   Suicidal    Barry Moore is a 56 y.o. male.  56 yo M with a chief complaints of possible seizure activity and suicidality.  Patient is unwilling to discuss this with me.  No acute distress.  Level 5 caveat intoxication.   Seizures      Home Medications Prior to Admission medications   Medication Sig Start Date End Date Taking? Authorizing Provider  citalopram (CELEXA) 10 MG tablet Take 1 tablet (10 mg total) by mouth daily. 10/29/21   Vesta Mixer, NP  gabapentin (NEURONTIN) 300 MG capsule Take 1 capsule (300 mg total) by mouth 3 (three) times daily. 01/21/22 02/20/22  Merrily Brittle, DO  Multiple Vitamin (MULTIVITAMIN WITH MINERALS) TABS tablet Take 1 tablet by mouth daily. 01/21/22 02/20/22  Merrily Brittle, DO  thiamine (VITAMIN B-1) 100 MG tablet Take 1 tablet (100 mg total) by mouth daily. 01/21/22 02/20/22  Merrily Brittle, DO  ferrous sulfate 325 (65 FE) MG tablet Take 1 tablet (325 mg total) by mouth daily with breakfast. Patient not taking: Reported on 09/23/2018 08/09/18 10/09/18  Elodia Florence., MD  pantoprazole (PROTONIX) 40 MG tablet Take 1 tablet (40 mg total) by mouth 2 (two) times daily before a meal. Patient not taking: Reported on 08/14/2020 06/27/20 08/14/20  Charlynne Cousins, MD      Allergies    Tomato and Aspirin    Review of Systems   Review of Systems  Neurological:  Positive for seizures.    Physical Exam Updated Vital Signs BP 129/80   Pulse 90   Temp (!) 97.4 F (36.3 C) (Oral)   Resp 16   SpO2 98%  Physical Exam Vitals and nursing note reviewed.  Constitutional:      Appearance: He is well-developed.  HENT:     Head: Normocephalic and atraumatic.  Eyes:     Pupils: Pupils are  equal, round, and reactive to light.  Neck:     Vascular: No JVD.  Cardiovascular:     Rate and Rhythm: Normal rate and regular rhythm.     Heart sounds: No murmur heard.    No friction rub. No gallop.  Pulmonary:     Effort: No respiratory distress.     Breath sounds: No wheezing.  Abdominal:     General: There is no distension.     Tenderness: There is no abdominal tenderness. There is no guarding or rebound.  Musculoskeletal:        General: Normal range of motion.     Cervical back: Normal range of motion and neck supple.  Skin:    Coloration: Skin is not pale.     Findings: No rash.  Neurological:     Mental Status: He is alert and oriented to person, place, and time.  Psychiatric:        Behavior: Behavior normal.     ED Results / Procedures / Treatments   Labs (all labs ordered are listed, but only abnormal results are displayed) Labs Reviewed  COMPREHENSIVE METABOLIC PANEL - Abnormal; Notable for the following components:      Result Value   CO2 21 (*)    Glucose, Bld 105 (*)    AST 216 (*)    ALT 115 (*)  Alkaline Phosphatase 186 (*)    Anion gap 16 (*)    All other components within normal limits  CBC WITH DIFFERENTIAL/PLATELET - Abnormal; Notable for the following components:   RBC 4.09 (*)    Hemoglobin 10.9 (*)    HCT 35.1 (*)    RDW 16.5 (*)    All other components within normal limits  ETHANOL - Abnormal; Notable for the following components:   Alcohol, Ethyl (B) 340 (*)    All other components within normal limits  URINALYSIS, ROUTINE W REFLEX MICROSCOPIC - Abnormal; Notable for the following components:   Color, Urine COLORLESS (*)    Specific Gravity, Urine 1.002 (*)    All other components within normal limits  RAPID URINE DRUG SCREEN, HOSP PERFORMED  CBG MONITORING, ED    EKG EKG Interpretation  Date/Time:  Friday January 22 2022 21:36:14 EDT Ventricular Rate:  91 PR Interval:  126 QRS Duration: 86 QT Interval:  346 QTC  Calculation: 425 R Axis:   71 Text Interpretation: Normal sinus rhythm Normal ECG no wpw prolonged qt or brugada No significant change since last tracing Confirmed by Deno Etienne [phone]) on 01/22/2022 11:08:28 PM  Radiology No results found.  Procedures Procedures  {Document cardiac monitor, telemetry assessment procedure when appropriate:1}  Medications Ordered in ED Medications  levETIRAcetam (KEPPRA) IVPB 500 mg/100 mL premix (500 mg Intravenous New Bag/Given 01/22/22 2335)  thiamine (VITAMIN B1) tablet 100 mg (100 mg Oral Given 01/22/22 2332)    Or  thiamine (VITAMIN B1) injection 100 mg ( Intravenous See Alternative 01/28/13 7829)  folic acid (FOLVITE) tablet 1 mg (1 mg Oral Given 01/22/22 2332)  multivitamin with minerals tablet 1 tablet (1 tablet Oral Given 01/22/22 2332)  LORazepam (ATIVAN) injection 1 mg (1 mg Intravenous Given 01/22/22 2139)    ED Course/ Medical Decision Making/ A&P                           Medical Decision Making  57 yo M with a chief complaints of concern for seizure-like activity and suicidality.  This is the patient's 24th visit in the past 6 months.  He is well-known to this emergency department.  Lab work consistent with alcohol intoxication.  Will assess for clinical sobriety.  We will attempt to ambulate.    {Document critical care time when appropriate:1} {Document review of labs and clinical decision tools ie heart score, Chads2Vasc2 etc:1}  {Document your independent review of radiology images, and any outside records:1} {Document your discussion with family members, caretakers, and with consultants:1} {Document social determinants of health affecting pt's care:1} {Document your decision making why or why not admission, treatments were needed:1} Final Clinical Impression(s) / ED Diagnoses Final diagnoses:  None    Rx / DC Orders ED Discharge Orders     None

## 2022-01-22 NOTE — ED Provider Triage Note (Signed)
Emergency Medicine Provider Triage Evaluation Note  Barry Moore , a 56 y.o. male  was evaluated in triage.  Pt complains of seizure earlier this AM. Hx of depression, EtOH abuse. Unknown last drink.   Review of Systems  Positive: seizure  Negative: weakness unilateral   Physical Exam  There were no vitals taken for this visit. Gen:   Awake, no distress   Resp:  Normal effort  MSK:   Moves extremities without difficulty  Other:  Post-ictal  Medical Decision Making  Medically screening exam initiated at 9:31 PM.  Appropriate orders placed.  Barry Moore was informed that the remainder of the evaluation will be completed by another provider, this initial triage assessment does not replace that evaluation, and the importance of remaining in the ED until their evaluation is complete.  Pt seizing on arrival to ED. 1 mg Ativan ordered via IV. Alerted nursing. Agricultural consultant notified.    Osvaldo Shipper, [place] 01/22/22 2133

## 2022-01-22 NOTE — Care Management (Signed)
PCP and SA resources on AVS

## 2022-01-23 NOTE — ED Notes (Signed)
Patient ambulatory down hallway without assistance. No s/s of any distress. No verbal c/o pain or discomfort.

## 2022-01-23 NOTE — ED Notes (Signed)
Patient refused to take discharge paperwork states "I don't want that shit." Patient A&Ox4, no s/s of any distress. No verbal complaints.

## 2022-01-24 ENCOUNTER — Other Ambulatory Visit: Payer: Self-pay

## 2022-01-24 ENCOUNTER — Emergency Department (HOSPITAL_COMMUNITY)
Admission: EM | Admit: 2022-01-24 | Discharge: 2022-01-26 | Disposition: A | Discharge status: Home / Self Care | Payer: Medicaid Other | Attending: Emergency Medicine | Admitting: Emergency Medicine

## 2022-01-24 ENCOUNTER — Encounter (HOSPITAL_COMMUNITY): Payer: Self-pay

## 2022-01-24 DIAGNOSIS — R7401 Elevation of levels of liver transaminase levels: Secondary | ICD-10-CM | POA: Diagnosis not present

## 2022-01-24 DIAGNOSIS — I10 Essential (primary) hypertension: Secondary | ICD-10-CM | POA: Diagnosis not present

## 2022-01-24 DIAGNOSIS — D649 Anemia, unspecified: Secondary | ICD-10-CM | POA: Insufficient documentation

## 2022-01-24 DIAGNOSIS — F17210 Nicotine dependence, cigarettes, uncomplicated: Secondary | ICD-10-CM | POA: Diagnosis not present

## 2022-01-24 DIAGNOSIS — Y908 Blood alcohol level of 240 mg/100 ml or more: Secondary | ICD-10-CM | POA: Diagnosis not present

## 2022-01-24 DIAGNOSIS — F10129 Alcohol abuse with intoxication, unspecified: Secondary | ICD-10-CM | POA: Diagnosis present

## 2022-01-24 DIAGNOSIS — Z5900 Homelessness unspecified: Secondary | ICD-10-CM | POA: Insufficient documentation

## 2022-01-24 DIAGNOSIS — F332 Major depressive disorder, recurrent severe without psychotic features: Secondary | ICD-10-CM | POA: Diagnosis not present

## 2022-01-24 DIAGNOSIS — F1010 Alcohol abuse, uncomplicated: Secondary | ICD-10-CM

## 2022-01-24 DIAGNOSIS — R45851 Suicidal ideations: Secondary | ICD-10-CM | POA: Insufficient documentation

## 2022-01-24 DIAGNOSIS — Z1152 Encounter for screening for COVID-19: Secondary | ICD-10-CM | POA: Diagnosis not present

## 2022-01-24 LAB — CBC WITH DIFFERENTIAL/PLATELET
Abs Immature Granulocytes: 0 10*3/uL (ref 0.00–0.07)
Basophils Absolute: 0.1 10*3/uL (ref 0.0–0.1)
Basophils Relative: 2 %
Eosinophils Absolute: 0.2 10*3/uL (ref 0.0–0.5)
Eosinophils Relative: 3 %
HCT: 30 % — ABNORMAL LOW (ref 39.0–52.0)
Hemoglobin: 9.5 g/dL — ABNORMAL LOW (ref 13.0–17.0)
Immature Granulocytes: 0 %
Lymphocytes Relative: 47 %
Lymphs Abs: 2.3 10*3/uL (ref 0.7–4.0)
MCH: 27.3 pg (ref 26.0–34.0)
MCHC: 31.7 g/dL (ref 30.0–36.0)
MCV: 86.2 fL (ref 80.0–100.0)
Monocytes Absolute: 0.7 10*3/uL (ref 0.1–1.0)
Monocytes Relative: 15 %
Neutro Abs: 1.6 10*3/uL — ABNORMAL LOW (ref 1.7–7.7)
Neutrophils Relative %: 33 %
Platelets: 165 10*3/uL (ref 150–400)
RBC: 3.48 MIL/uL — ABNORMAL LOW (ref 4.22–5.81)
RDW: 16.6 % — ABNORMAL HIGH (ref 11.5–15.5)
WBC: 4.8 10*3/uL (ref 4.0–10.5)
nRBC: 0 % (ref 0.0–0.2)

## 2022-01-24 LAB — COMPREHENSIVE METABOLIC PANEL
ALT: 78 U/L — ABNORMAL HIGH (ref 0–44)
AST: 127 U/L — ABNORMAL HIGH (ref 15–41)
Albumin: 3.8 g/dL (ref 3.5–5.0)
Alkaline Phosphatase: 142 U/L — ABNORMAL HIGH (ref 38–126)
Anion gap: 8 (ref 5–15)
BUN: 10 mg/dL (ref 6–20)
CO2: 24 mmol/L (ref 22–32)
Calcium: 8.4 mg/dL — ABNORMAL LOW (ref 8.9–10.3)
Chloride: 107 mmol/L (ref 98–111)
Creatinine, Ser: 0.79 mg/dL (ref 0.61–1.24)
GFR, Estimated: >60 mL/min (ref >60)
Glucose, Bld: 95 mg/dL (ref 70–99)
Potassium: 3.8 mmol/L (ref 3.5–5.1)
Sodium: 139 mmol/L (ref 135–145)
Total Bilirubin: 0.4 mg/dL (ref 0.3–1.2)
Total Protein: 7.5 g/dL (ref 6.5–8.1)

## 2022-01-24 LAB — ETHANOL: Alcohol, Ethyl (B): 268 mg/dL — ABNORMAL HIGH (ref <10)

## 2022-01-24 MED ORDER — LORAZEPAM 2 MG/ML IJ SOLN: 0.0000 mg | Freq: Two times a day (BID) | INTRAMUSCULAR | Status: DC

## 2022-01-24 MED ORDER — LORAZEPAM 1 MG PO TABS
0.0000 mg | ORAL_TABLET | Freq: Four times a day (QID) | ORAL | Status: DC
  Administered 2022-01-25: 2 mg via ORAL
  Administered 2022-01-25: 1 mg via ORAL
  Filled 2022-01-24: qty 1
  Filled 2022-01-24: qty 2

## 2022-01-24 MED ORDER — LORAZEPAM 2 MG/ML IJ SOLN: 0.0000 mg | Freq: Four times a day (QID) | INTRAMUSCULAR | Status: DC

## 2022-01-24 MED ORDER — LORAZEPAM 1 MG PO TABS: 0.0000 mg | ORAL_TABLET | Freq: Two times a day (BID) | ORAL | Status: DC

## 2022-01-24 MED ORDER — THIAMINE HCL 100 MG/ML IJ SOLN
100.0000 mg | Freq: Every day | INTRAMUSCULAR | Status: DC
  Filled 2022-01-24: qty 2

## 2022-01-24 MED ORDER — THIAMINE MONONITRATE 100 MG PO TABS
100.0000 mg | ORAL_TABLET | Freq: Every day | ORAL | Status: DC
  Administered 2022-01-25 – 2022-01-26 (×2): 100 mg via ORAL
  Filled 2022-01-24 (×2): qty 1

## 2022-01-24 NOTE — ED Provider Notes (Signed)
[facility] DEPT Provider Note   CSN: [id number] Arrival date & time: 01/24/22  1808     History  Chief Complaint  Patient presents with   Suicidal    Barry Moore is a 56 y.o. male.  HPI   Patient has a history of hypertension seizures alcohol abuse alcoholic hepatitis, pancreatitis, depression, GI bleeding.  Patient has history of frequent visits to the ED for similar symptoms.  He has been seen 25 times in the last 6 months.  Patient was seen recently on October 24, October 31 as well as November 3 for the same symptoms.  Patient states he was drinking alcohol heavily today.  He states he feels depressed and suicidal.  He denies any abdominal pain.  He denies any vomiting or diarrhea.  Patient is hungry and he wants something to eat right now  Home Medications Prior to Admission medications   Medication Sig Start Date End Date Taking? Authorizing Provider  citalopram (CELEXA) 10 MG tablet Take 1 tablet (10 mg total) by mouth daily. 10/29/21  Yes Vesta Mixer, NP  gabapentin (NEURONTIN) 300 MG capsule Take 1 capsule (300 mg total) by mouth 3 (three) times daily. 01/21/22 02/20/22 Yes Merrily Brittle, DO  Multiple Vitamin (MULTIVITAMIN WITH MINERALS) TABS tablet Take 1 tablet by mouth daily. 01/21/22 02/20/22 Yes Merrily Brittle, DO  thiamine (VITAMIN B-1) 100 MG tablet Take 1 tablet (100 mg total) by mouth daily. 01/21/22 02/20/22 Yes Merrily Brittle, DO  ferrous sulfate 325 (65 FE) MG tablet Take 1 tablet (325 mg total) by mouth daily with breakfast. Patient not taking: Reported on 09/23/2018 08/09/18 10/09/18  Elodia Florence., MD  pantoprazole (PROTONIX) 40 MG tablet Take 1 tablet (40 mg total) by mouth 2 (two) times daily before a meal. Patient not taking: Reported on 08/14/2020 06/27/20 08/14/20  Charlynne Cousins, MD      Allergies    Tomato and Aspirin    Review of Systems   Review of Systems  Physical Exam Updated Vital Signs BP 124/65    Pulse 82   Temp 98.3 F (36.8 C) (Oral)   Resp 16   Ht 1.727 m ('5\' 8"'$ )   Wt 68 kg   SpO2 98%   BMI 22.79 kg/m  Physical Exam Vitals and nursing note reviewed.  Constitutional:      General: He is not in acute distress.    Appearance: He is well-developed.  HENT:     Head: Normocephalic and atraumatic.     Right Ear: External ear normal.     Left Ear: External ear normal.  Eyes:     General: No scleral icterus.       Right eye: No discharge.        Left eye: No discharge.     Conjunctiva/sclera: Conjunctivae normal.  Neck:     Trachea: No tracheal deviation.  Cardiovascular:     Rate and Rhythm: Normal rate.  Pulmonary:     Effort: Pulmonary effort is normal. No respiratory distress.     Breath sounds: No stridor.  Abdominal:     General: There is no distension.  Musculoskeletal:        General: No swelling or deformity.     Cervical back: Neck supple.  Skin:    General: Skin is warm and dry.     Findings: No rash.  Neurological:     Mental Status: He is alert.     Cranial Nerves: Cranial nerve deficit: no  gross deficits.     Comments: Movements low, appears intoxicated  Psychiatric:        Mood and Affect: Mood is depressed.        Speech: Speech is not delayed or tangential.        Behavior: Behavior is not aggressive or hyperactive.        Thought Content: Thought content includes suicidal ideation. Thought content does not include homicidal ideation.     ED Results / Procedures / Treatments   Labs (all labs ordered are listed, but only abnormal results are displayed) Labs Reviewed  COMPREHENSIVE METABOLIC PANEL - Abnormal; Notable for the following components:      Result Value   Calcium 8.4 (*)    AST 127 (*)    ALT 78 (*)    Alkaline Phosphatase 142 (*)    All other components within normal limits  ETHANOL - Abnormal; Notable for the following components:   Alcohol, Ethyl (B) 268 (*)    All other components within normal limits  CBC WITH  DIFFERENTIAL/PLATELET - Abnormal; Notable for the following components:   RBC 3.48 (*)    Hemoglobin 9.5 (*)    HCT 30.0 (*)    RDW 16.6 (*)    Neutro Abs 1.6 (*)    All other components within normal limits  RAPID URINE DRUG SCREEN, HOSP PERFORMED  CBC    EKG None  Radiology No results found.  Procedures Procedures    Medications Ordered in ED Medications  LORazepam (ATIVAN) injection 0-4 mg (has no administration in time range)    Or  LORazepam (ATIVAN) tablet 0-4 mg (has no administration in time range)  LORazepam (ATIVAN) injection 0-4 mg (has no administration in time range)    Or  LORazepam (ATIVAN) tablet 0-4 mg (has no administration in time range)  thiamine (VITAMIN B1) tablet 100 mg (has no administration in time range)    Or  thiamine (VITAMIN B1) injection 100 mg (has no administration in time range)    ED Course/ Medical Decision Making/ A&P Clinical Course as of 01/24/22 2343  Sun Jan 24, 2022  2302 CBC with Diff(!) Anemia noted, hemoglobin decreased.  Will monitor , repeat hgb in am [JK]  2339 Comprehensive metabolic panel(!) persistent LFT elevation [JK]  2339 Ethanol(!) Elevated  [JK]    Clinical Course User Index [JK] Dorie Rank, MD                           Medical Decision Making Problems Addressed: Alcohol abuse: chronic illness or injury with exacerbation, progression, or side effects of treatment Suicidal ideation: acute illness or injury  Amount and/or Complexity of Data Reviewed Labs: ordered. Decision-making details documented in ED Course.  Risk OTC drugs. Prescription drug management.   Patient presents with recurrent alcohol intoxication and suicidal ideation.  Patient noted to have mild decrease in his hemoglobin.  No symptoms of hematemesis or rectal bleeding.  We will monitor.  Repeat hemoglobin morning.  Otherwise medically cleared for psychiatric evaluation.  The patient has been placed in psychiatric observation due to  the need to provide a safe environment for the patient while obtaining psychiatric consultation and evaluation, as well as ongoing medical and medication management to treat the patient's condition.  The patient has not been placed under full IVC at this time.         Final Clinical Impression(s) / ED Diagnoses Final diagnoses:  Alcohol abuse  Suicidal  ideation    Rx / DC Orders ED Discharge Orders     None         Dorie Rank, MD 01/24/22 2344

## 2022-01-24 NOTE — ED Triage Notes (Signed)
Pt arrived via POV, c/o SI, no specific plan.

## 2022-01-25 LAB — CBC
HCT: 34.3 % — ABNORMAL LOW (ref 39.0–52.0)
Hemoglobin: 10.6 g/dL — ABNORMAL LOW (ref 13.0–17.0)
MCH: 26.5 pg (ref 26.0–34.0)
MCHC: 30.9 g/dL (ref 30.0–36.0)
MCV: 85.8 fL (ref 80.0–100.0)
Platelets: 199 10*3/uL (ref 150–400)
RBC: 4 MIL/uL — ABNORMAL LOW (ref 4.22–5.81)
RDW: 16.5 % — ABNORMAL HIGH (ref 11.5–15.5)
WBC: 4.6 10*3/uL (ref 4.0–10.5)
nRBC: 0 % (ref 0.0–0.2)

## 2022-01-25 LAB — RAPID URINE DRUG SCREEN, HOSP PERFORMED
Amphetamines: NOT DETECTED
Barbiturates: NOT DETECTED
Benzodiazepines: NOT DETECTED
Cocaine: NOT DETECTED
Opiates: NOT DETECTED
Tetrahydrocannabinol: NOT DETECTED

## 2022-01-25 MED ORDER — LORAZEPAM 1 MG PO TABS
2.0000 mg | ORAL_TABLET | Freq: Once | ORAL | Status: AC
  Administered 2022-01-25: 2 mg via ORAL
  Filled 2022-01-25: qty 2
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
## 2022-01-25 NOTE — BH Assessment (Signed)
Per RN, Pt is currently too somnolent to participate in tele-assessment. Pt will be assessed when he is able to participate.   [name], [facility], [facility] Triage Specialist [phone]

## 2022-01-25 NOTE — ED Notes (Signed)
Pt resting in bed with eyes closed. No distress noted. RR even and unlabored. Bed in low position

## 2022-01-25 NOTE — ED Notes (Signed)
Patient sitting up in bed eating his dinner. No complaints besides some agitation. Lorazepam given. Sitter at bedside. No other issues or concerns at this time. JRPRN

## 2022-01-26 ENCOUNTER — Encounter (HOSPITAL_COMMUNITY): Payer: Self-pay

## 2022-01-26 ENCOUNTER — Other Ambulatory Visit: Payer: Self-pay

## 2022-01-26 ENCOUNTER — Emergency Department (EMERGENCY_DEPARTMENT_HOSPITAL)
Admission: EM | Admit: 2022-01-26 | Discharge: 2022-01-27 | Disposition: A | Discharge status: Other Acute Inpatient Hospital | Payer: Medicaid Other | Source: Home / Self Care | Attending: Emergency Medicine | Admitting: Emergency Medicine

## 2022-01-26 DIAGNOSIS — F10129 Alcohol abuse with intoxication, unspecified: Secondary | ICD-10-CM | POA: Insufficient documentation

## 2022-01-26 DIAGNOSIS — F332 Major depressive disorder, recurrent severe without psychotic features: Secondary | ICD-10-CM | POA: Insufficient documentation

## 2022-01-26 DIAGNOSIS — Z20822 Contact with and (suspected) exposure to covid-19: Secondary | ICD-10-CM | POA: Insufficient documentation

## 2022-01-26 DIAGNOSIS — R7401 Elevation of levels of liver transaminase levels: Secondary | ICD-10-CM | POA: Insufficient documentation

## 2022-01-26 DIAGNOSIS — Z5900 Homelessness unspecified: Secondary | ICD-10-CM | POA: Insufficient documentation

## 2022-01-26 DIAGNOSIS — F17210 Nicotine dependence, cigarettes, uncomplicated: Secondary | ICD-10-CM | POA: Insufficient documentation

## 2022-01-26 DIAGNOSIS — R45851 Suicidal ideations: Secondary | ICD-10-CM | POA: Insufficient documentation

## 2022-01-26 DIAGNOSIS — I10 Essential (primary) hypertension: Secondary | ICD-10-CM | POA: Insufficient documentation

## 2022-01-26 DIAGNOSIS — D649 Anemia, unspecified: Secondary | ICD-10-CM | POA: Insufficient documentation

## 2022-01-26 DIAGNOSIS — Y908 Blood alcohol level of 240 mg/100 ml or more: Secondary | ICD-10-CM | POA: Insufficient documentation

## 2022-01-26 LAB — CBC WITH DIFFERENTIAL/PLATELET
Abs Immature Granulocytes: 0.02 10*3/uL (ref 0.00–0.07)
Basophils Absolute: 0.1 10*3/uL (ref 0.0–0.1)
Basophils Relative: 1 %
Eosinophils Absolute: 0.1 10*3/uL (ref 0.0–0.5)
Eosinophils Relative: 2 %
HCT: 34.5 % — ABNORMAL LOW (ref 39.0–52.0)
Hemoglobin: 10.9 g/dL — ABNORMAL LOW (ref 13.0–17.0)
Immature Granulocytes: 0 %
Lymphocytes Relative: 38 %
Lymphs Abs: 2.2 10*3/uL (ref 0.7–4.0)
MCH: 26.9 pg (ref 26.0–34.0)
MCHC: 31.6 g/dL (ref 30.0–36.0)
MCV: 85.2 fL (ref 80.0–100.0)
Monocytes Absolute: 0.9 10*3/uL (ref 0.1–1.0)
Monocytes Relative: 15 %
Neutro Abs: 2.5 10*3/uL (ref 1.7–7.7)
Neutrophils Relative %: 44 %
Platelets: 192 10*3/uL (ref 150–400)
RBC: 4.05 MIL/uL — ABNORMAL LOW (ref 4.22–5.81)
RDW: 16 % — ABNORMAL HIGH (ref 11.5–15.5)
WBC: 5.8 10*3/uL (ref 4.0–10.5)
nRBC: 0 % (ref 0.0–0.2)

## 2022-01-26 LAB — COMPREHENSIVE METABOLIC PANEL
ALT: 90 U/L — ABNORMAL HIGH (ref 0–44)
AST: 153 U/L — ABNORMAL HIGH (ref 15–41)
Albumin: 4.2 g/dL (ref 3.5–5.0)
Alkaline Phosphatase: 133 U/L — ABNORMAL HIGH (ref 38–126)
Anion gap: 10 (ref 5–15)
BUN: 9 mg/dL (ref 6–20)
CO2: 21 mmol/L — ABNORMAL LOW (ref 22–32)
Calcium: 9.5 mg/dL (ref 8.9–10.3)
Chloride: 105 mmol/L (ref 98–111)
Creatinine, Ser: 0.59 mg/dL — ABNORMAL LOW (ref 0.61–1.24)
GFR, Estimated: >60 mL/min (ref >60)
Glucose, Bld: 109 mg/dL — ABNORMAL HIGH (ref 70–99)
Potassium: 3.7 mmol/L (ref 3.5–5.1)
Sodium: 136 mmol/L (ref 135–145)
Total Bilirubin: 0.5 mg/dL (ref 0.3–1.2)
Total Protein: 8.3 g/dL — ABNORMAL HIGH (ref 6.5–8.1)

## 2022-01-26 LAB — ETHANOL: Alcohol, Ethyl (B): 336 mg/dL (ref <10)

## 2022-01-26 LAB — MAGNESIUM: Magnesium: 2.5 mg/dL — ABNORMAL HIGH (ref 1.7–2.4)

## 2022-01-26 MED ORDER — ADULT MULTIVITAMIN W/MINERALS CH
1.0000 | ORAL_TABLET | Freq: Every day | ORAL | Status: DC
  Administered 2022-01-27: 1 via ORAL
  Filled 2022-01-26: qty 1

## 2022-01-26 MED ORDER — FOLIC ACID 1 MG PO TABS
1.0000 mg | ORAL_TABLET | Freq: Every day | ORAL | Status: DC
  Administered 2022-01-27: 1 mg via ORAL
  Filled 2022-01-26: qty 1

## 2022-01-26 MED ORDER — LORAZEPAM 1 MG PO TABS
1.0000 mg | ORAL_TABLET | ORAL | Status: DC | PRN
  Administered 2022-01-27: 1 mg via ORAL
  Administered 2022-01-27: 2 mg via ORAL
  Filled 2022-01-26: qty 2
  Filled 2022-01-26: qty 1

## 2022-01-26 MED ORDER — THIAMINE MONONITRATE 100 MG PO TABS
100.0000 mg | ORAL_TABLET | Freq: Every day | ORAL | Status: DC
  Administered 2022-01-27: 100 mg via ORAL
  Filled 2022-01-26: qty 1

## 2022-01-26 MED ORDER — THIAMINE HCL 100 MG/ML IJ SOLN: 100.0000 mg | Freq: Every day | INTRAMUSCULAR | Status: DC

## 2022-01-26 MED ORDER — LORAZEPAM 2 MG/ML IJ SOLN: 1.0000 mg | INTRAMUSCULAR | Status: DC | PRN

## 2022-01-26 NOTE — ED Triage Notes (Signed)
Pt reports drinking a lot of alcohol. 4-5 40s a day per patient.

## 2022-01-26 NOTE — ED Notes (Addendum)
Bus pass given to pt. Pt ambulatory.  An After Visit Summary was printed and given to the patient. Discharge instructions given and no further questions at this time.  Pt denies SI and HI at this time.

## 2022-01-26 NOTE — ED Triage Notes (Signed)
Pt states "I wanna die, I got alotta stuff going on". Pt declining to elaborate. Pt became verbally aggressive initially upon writer inquiring what makes him want to die. No plan in place. Pt reports attempting suicide in the past via OD.

## 2022-01-26 NOTE — ED Provider Notes (Signed)
[facility] DEPT Provider Note   CSN: [id number] Arrival date & time: 01/26/22  1915     History  Chief Complaint  Patient presents with   Suicidal    Barry Moore is a 56 y.o. male.  With past medical history of alcohol abuse complicated by pancreatitis, GI bleed, thrombocytopenia, hypertension, homelessness who presents to the emergency department with HI/SI  Patient is agitated on exam.  He provides very limited history.  States that he "needs to commit himself."  He states that "everyone wants to take my money."  He states that he is "homicidal or suicidal, one of the 2."  He does not have a particular plan of how he would do this. When asked about his visit to the emergency department and discharged this morning.  He was seen by behavioral health and felt that inpatient criteria was not met.  Apparently he was intoxicated on 03/26/2021 and was eventually evaluated by behavioral health after he clinically sobered and that time was denying SI/HI/AVH.  He is currently intoxicated and endorses drinking heavily.  HPI     Home Medications Prior to Admission medications   Medication Sig Start Date End Date Taking? Authorizing Provider  thiamine (VITAMIN B-1) 100 MG tablet Take 1 tablet (100 mg total) by mouth daily. 01/21/22 02/20/22 Yes Merrily Brittle, DO  citalopram (CELEXA) 10 MG tablet Take 1 tablet (10 mg total) by mouth daily. Patient not taking: Reported on 01/26/2022 10/29/21   Vesta Mixer, NP  gabapentin (NEURONTIN) 300 MG capsule Take 1 capsule (300 mg total) by mouth 3 (three) times daily. Patient not taking: Reported on 01/26/2022 01/21/22 02/20/22  Merrily Brittle, DO  Multiple Vitamin (MULTIVITAMIN WITH MINERALS) TABS tablet Take 1 tablet by mouth daily. Patient not taking: Reported on 01/26/2022 01/21/22 02/20/22  Merrily Brittle, DO  ferrous sulfate 325 (65 FE) MG tablet Take 1 tablet (325 mg total) by mouth daily with breakfast. Patient not  taking: Reported on 09/23/2018 08/09/18 10/09/18  Elodia Florence., MD  pantoprazole (PROTONIX) 40 MG tablet Take 1 tablet (40 mg total) by mouth 2 (two) times daily before a meal. Patient not taking: Reported on 08/14/2020 06/27/20 08/14/20  Charlynne Cousins, MD      Allergies    Tomato and Aspirin    Review of Systems   Review of Systems  Psychiatric/Behavioral:  Positive for suicidal ideas.   All other systems reviewed and are negative.   Physical Exam Updated Vital Signs BP 126/74 (BP Location: Left Arm)   Pulse 95   Temp 98.1 F (36.7 C) (Oral)   Resp 14   Ht _0  (1.727 m)   Wt 71.7 kg   SpO2 98%   BMI 24.02 kg/m  Physical Exam Vitals and nursing note reviewed.  Constitutional:      Appearance: He is ill-appearing.     Comments: Unkempt appearance, clinically intoxicated  HENT:     Head: Normocephalic and atraumatic.     Mouth/Throat:     Mouth: Mucous membranes are dry.     Pharynx: Oropharynx is clear.  Eyes:     General: No scleral icterus.    Extraocular Movements: Extraocular movements intact.  Cardiovascular:     Rate and Rhythm: Normal rate and regular rhythm.     Pulses: Normal pulses.     Heart sounds: No murmur heard. Pulmonary:     Effort: Pulmonary effort is normal. No respiratory distress.  Abdominal:     Palpations: Abdomen  is soft.  Musculoskeletal:        General: Normal range of motion.     Cervical back: Neck supple.  Skin:    General: Skin is warm and dry.     Capillary Refill: Capillary refill takes less than 2 seconds.     Findings: No rash.  Neurological:     General: No focal deficit present.     Mental Status: He is alert.  Psychiatric:        Mood and Affect: Mood is depressed. Affect is labile and angry.        Speech: Speech is slurred.        Behavior: Behavior is agitated.        Thought Content: Thought content includes homicidal and suicidal ideation. Thought content does not include homicidal or suicidal plan.         Judgment: Judgment is impulsive and inappropriate.     ED Results / Procedures / Treatments   Labs (all labs ordered are listed, but only abnormal results are displayed) Labs Reviewed  CBC WITH DIFFERENTIAL/PLATELET - Abnormal; Notable for the following components:      Result Value   RBC 4.05 (*)    Hemoglobin 10.9 (*)    HCT 34.5 (*)    RDW 16.0 (*)    All other components within normal limits  COMPREHENSIVE METABOLIC PANEL - Abnormal; Notable for the following components:   CO2 21 (*)    Glucose, Bld 109 (*)    Creatinine, Ser 0.59 (*)    Total Protein 8.3 (*)    AST 153 (*)    ALT 90 (*)    Alkaline Phosphatase 133 (*)    All other components within normal limits  MAGNESIUM - Abnormal; Notable for the following components:   Magnesium 2.5 (*)    All other components within normal limits  ETHANOL - Abnormal; Notable for the following components:   Alcohol, Ethyl (B) 336 (*)    All other components within normal limits  RAPID URINE DRUG SCREEN, HOSP PERFORMED  URINALYSIS, ROUTINE W REFLEX MICROSCOPIC    EKG None  Radiology No results found.  Procedures Procedures   Medications Ordered in ED Medications  LORazepam (ATIVAN) tablet 1-4 mg (has no administration in time range)    Or  LORazepam (ATIVAN) injection 1-4 mg (has no administration in time range)  thiamine (VITAMIN B1) tablet 100 mg (has no administration in time range)    Or  thiamine (VITAMIN B1) injection 100 mg (has no administration in time range)  folic acid (FOLVITE) tablet 1 mg (has no administration in time range)  multivitamin with minerals tablet 1 tablet (has no administration in time range)    ED Course/ Medical Decision Making/ A&P                           Medical Decision Making Risk OTC drugs. Prescription drug management.  This patient presents to the ED with chief complaint(s) of SI/HI/alcohol intoxication with pertinent past medical history of call abuse which further  complicates the presenting complaint. The complaint involves an extensive differential diagnosis and also carries with it a high risk of complications and morbidity.    The differential diagnosis includes alcohol use with depressed mood versus psychiatric illness  Additional history obtained: Additional history obtained from  none available Records reviewed Care Everywhere/External Records and Primary Care Documents, ER visit from yesterday  ED Course and Reassessment: 56 year old male who  presents to the emergency department intoxicated and stating that he is suicidal and/or homicidal.  On my initial exam he is chronically ill-appearing, unkempt, intoxicated.  He is hemodynamically stable.  He is alert on oriented and conversational but he is agitated. His physical exam is relatively unremarkable.  Protuberant abdomen that is soft and nontender.  His lungs are clear.  Labs with alcohol of 336.  Remainder of labs are stable from previous.  Ordered CIWA.  Patient will need to clinically sober before talking to psychiatry again.  This may just be alcohol use with depressed mood but will have him evaluated formally by psychiatry and appreciate their recommendations.  Independent labs interpretation:  The following labs were independently interpreted: Anemia, stable from yesterday; CMP with transaminitis consistent with alcohol use.  Electrolytes are within normal limits.  Magnesium is just out of range at 2.5.  Alcohol is 336.  Independent visualization of imaging: Not indicated  Consultation: - Consulted or discussed management/test interpretation w/ external professional: TTS  Consideration for admission or further workup: Social Determinants of health: Homelessness, alcoholism Final Clinical Impression(s) / ED Diagnoses Final diagnoses:  None    Rx / DC Orders ED Discharge Orders     None

## 2022-01-26 NOTE — ED Notes (Signed)
D/c paper discussed w/ pt. Awaiting bus pass from SW.
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
## 2022-01-26 NOTE — ED Provider Notes (Signed)
Emergency Medicine Observation Re-evaluation Note  Barry Moore is a 56 y.o. male, seen on rounds today.  Pt initially presented to the ED for complaints of Suicidal Currently, the patient is awake and alert eating breakfast.  Physical Exam  BP (!) 167/87   Pulse 85   Temp 98 F (36.7 C) (Oral)   Resp 18   Ht '5\' 8"'$  (1.727 m)   Wt 68 kg   SpO2 97%   BMI 22.79 kg/m  Physical Exam General: nad Cardiac: rr Lungs: clear Psych: calm and cooperative  ED Course / MDM  EKG:   I have reviewed the labs performed to date as well as medications administered while in observation.  Recent changes in the last 24 hours include none.  Plan  Current plan is for d/c this morning.  Seen by josephine and does not appear to meet criteria for admission.    Blanchie Dessert, MD 01/26/22 1108

## 2022-01-26 NOTE — Progress Notes (Signed)
TOC was consulted to attach [facility] resources. Resources attached. TOC signing off.

## 2022-01-26 NOTE — Discharge Summary (Signed)
[facility] Psych ED Discharge  01/26/2022 10:53 AM Barry Moore  MRN:  [id number]  Principal Problem: Alcohol abuse with intoxication [facility]) Discharge Diagnoses: Principal Problem:   Alcohol abuse with intoxication ([place]) Active Problems:   Major depressive disorder, recurrent severe without psychotic features ([place])  Clinical Impression:  Final diagnoses:  Alcohol abuse  Suicidal ideation   Subjective: Barry Moore is a 56 y.o. male patient admitted with significant hx of . hypertension seizures alcohol abuse alcoholic hepatitis, pancreatitis, depression, GI bleeding arrived at the ER with complaint of SI with no specific plan yesterday. Today patient is sober, cleaned up, alert and oriented x4.  He denied withdrawal symptoms-no shakes, no tremors and no cold sweats.  He denied suicide ideation but admits to depression related to his inability to stop using Alcohol.  Patient also is worried about being homeless and lost his Tent which he uses to sleep in the woods.  Patient also believes that if he stops drinking Alcohol he will do better.  We discussed the consequences associated with his Alcoholism including but not limited to  Liver damage, Cirrhosis of the Liver and the medical issues it causes.  He also added that his sister would not welcome him due to his Alcohol issues.  We discussed the need to get back on his Mental health appointments and medications.  We discussed safety plan-go to the nearest ER  or [facility] for Mental health crisis, si/hi or substance abuse treatment.  Patient verbalized understanding.  Referral is made for him to follow up with [facility] providers for Mental health treatment.  Again patient denied SI/HI/AVH and no mention of paranoia.  Patient is Psychiatrically cleared for discharge. ED Assessment Time Calculation: Start Time: 1031 Stop Time: 1050 Total Time in Minutes (Assessment Completion): 19   Past Psychiatric History: see initial  Psych evaluation note  Past Medical History:  Past Medical History:  Diagnosis Date   Alcoholic hepatitis    Depression    ETOH abuse    Gastric bleed 08/2018   Hypertension    Pancreatitis    Seizures ([place])    Suicidal behavior     Past Surgical History:  Procedure Laterality Date   BIOPSY  08/05/2018   Procedure: BIOPSY;  Surgeon: Irving Copas., MD;  Location: [facility] ENDOSCOPY;  Service: Gastroenterology;;   BIOPSY  08/07/2018   Procedure: BIOPSY;  Surgeon: Thornton Park, MD;  Location: [facility] ENDOSCOPY;  Service: Gastroenterology;;   BIOPSY  01/02/2019   Procedure: BIOPSY;  Surgeon: Gatha Mayer, MD;  Location: [facility] ENDOSCOPY;  Service: Endoscopy;;   COLONOSCOPY WITH PROPOFOL N/A 08/07/2018   Procedure: COLONOSCOPY WITH PROPOFOL;  Surgeon: Thornton Park, MD;  Location: [facility];  Service: Gastroenterology;  Laterality: N/A;   ESOPHAGOGASTRODUODENOSCOPY N/A 01/02/2019   Procedure: ESOPHAGOGASTRODUODENOSCOPY (EGD);  Surgeon: Gatha Mayer, MD;  Location: [facility] ENDOSCOPY;  Service: Endoscopy;  Laterality: N/A;   ESOPHAGOGASTRODUODENOSCOPY N/A 07/03/2019   Procedure: ESOPHAGOGASTRODUODENOSCOPY (EGD);  Surgeon: Wonda Horner, MD;  Location: [facility] ENDOSCOPY;  Service: Endoscopy;  Laterality: N/A;   ESOPHAGOGASTRODUODENOSCOPY N/A 06/26/2020   Procedure: ESOPHAGOGASTRODUODENOSCOPY (EGD);  Surgeon: Arta Silence, MD;  Location: [facility] ENDOSCOPY;  Service: Endoscopy;  Laterality: N/A;   ESOPHAGOGASTRODUODENOSCOPY (EGD) WITH PROPOFOL N/A 11/02/2016   Procedure: ESOPHAGOGASTRODUODENOSCOPY (EGD) WITH PROPOFOL;  Surgeon: Carol Ada, MD;  Location: [facility] ENDOSCOPY;  Service: Endoscopy;  Laterality: N/A;   ESOPHAGOGASTRODUODENOSCOPY (EGD) WITH PROPOFOL N/A 08/05/2018   Procedure: ESOPHAGOGASTRODUODENOSCOPY (EGD) WITH PROPOFOL;  Surgeon: Irving Copas., MD;  Location: [facility] ENDOSCOPY;  Service: Gastroenterology;  Laterality: N/A;   ESOPHAGOGASTRODUODENOSCOPY (EGD) WITH PROPOFOL N/A  07/14/2019   Procedure: ESOPHAGOGASTRODUODENOSCOPY (EGD) WITH PROPOFOL;  Surgeon: Otis Brace, MD;  Location: [facility] ENDOSCOPY;  Service: Gastroenterology;  Laterality: N/A;   HERNIA REPAIR     LEG SURGERY     POLYPECTOMY  08/07/2018   Procedure: POLYPECTOMY;  Surgeon: Thornton Park, MD;  Location: [facility] ENDOSCOPY;  Service: Gastroenterology;;   Family History:  Family History  Problem Relation Age of Onset   Diabetes Mother    Alcoholism Mother    Emphysema Father    Lung cancer Father    Alcoholism Father    Family Psychiatric  History: see initial Psych evaluation note Social History:  Social History   Substance and Sexual Activity  Alcohol Use Yes   Comment: 5+ BEERS DAILY, Patient does not know how much he drank tonight     Social History   Substance and Sexual Activity  Drug Use Not Currently   Frequency: 3.0 times per week   Types: Cocaine    Social History   Socioeconomic History   Marital status: Divorced    Spouse name: Not on file   Number of children: Not on file   Years of education: Not on file   Highest education level: Not on file  Occupational History   Not on file  Tobacco Use   Smoking status: Every Day    Packs/day: 1.00    Types: Cigarettes   Smokeless tobacco: Never  Vaping Use   Vaping Use: Never used  Substance and Sexual Activity   Alcohol use: Yes    Comment: 5+ BEERS DAILY, Patient does not know how much he drank tonight   Drug use: Not Currently    Frequency: 3.0 times per week    Types: Cocaine   Sexual activity: Not Currently  Other Topics Concern   Not on file  Social History Narrative   Not on file   Social Determinants of Health   Financial Resource Strain: Not on file  Food Insecurity: Not on file  Transportation Needs: Not on file  Physical Activity: Not on file  Stress: Not on file  Social Connections: Not on file    Tobacco Cessation:  N/A, patient does not currently use tobacco products  Current  Medications: Current Facility-Administered Medications  Medication Dose Route Frequency Provider Last Rate Last Admin   LORazepam (ATIVAN) injection 0-4 mg  0-4 mg Intravenous Q6H Dorie Rank, MD       Or   LORazepam (ATIVAN) tablet 0-4 mg  0-4 mg Oral Q6H Dorie Rank, MD   2 mg at 01/25/22 1825   [START ON 01/27/2022] LORazepam (ATIVAN) injection 0-4 mg  0-4 mg Intravenous Raul Del, MD       Or   Derrill Memo ON 01/27/2022] LORazepam (ATIVAN) tablet 0-4 mg  0-4 mg Oral Q12H Dorie Rank, MD       thiamine (VITAMIN B1) tablet 100 mg  100 mg Oral Daily Dorie Rank, MD   100 mg at 01/26/22 5102   Or   thiamine (VITAMIN B1) injection 100 mg  100 mg Intravenous Daily Dorie Rank, MD       Current Outpatient Medications  Medication Sig Dispense Refill   citalopram (CELEXA) 10 MG tablet Take 1 tablet (10 mg total) by mouth daily. 7 tablet 0   gabapentin (NEURONTIN) 300 MG capsule Take 1 capsule (300 mg total) by mouth 3 (three) times daily. 90 capsule 0  Multiple Vitamin (MULTIVITAMIN WITH MINERALS) TABS tablet Take 1 tablet by mouth daily. 30 tablet 0   thiamine (VITAMIN B-1) 100 MG tablet Take 1 tablet (100 mg total) by mouth daily. 30 tablet 0   PTA Medications: (Not in a hospital admission)   [place] Scale:  [facility] from 01/24/2022 in [facility] DEPT ED from 01/22/2022 in [facility] ED from 01/19/2022 in [facility] CATEGORY High Risk High Risk No Risk       Musculoskeletal: Strength & Muscle Tone: within normal limits Gait & Station: normal Patient leans: Front  Psychiatric Specialty Exam: Presentation  General Appearance:  Casual; Neat  Eye Contact: Good  Speech: Clear and Coherent; Normal Rate  Speech Volume: Normal  Handedness: Right   Mood and Affect  Mood: Anxious  Affect: Congruent   Thought Process  Thought Processes: Coherent;  Linear  Descriptions of Associations:Intact  Orientation:Full (Time, Place and Person)  Thought Content:Logical  History of Schizophrenia/Schizoaffective disorder:No  Duration of Psychotic Symptoms:N/A  Hallucinations:Hallucinations: None  Ideas of Reference:None  Suicidal Thoughts:Suicidal Thoughts: No SI Passive Intent and/or Plan: -- (No plans but does not care if he dies any day or moment.  Nothing to live for.)  Homicidal Thoughts:Homicidal Thoughts: No   Sensorium  Memory: Immediate Good; Recent Fair; Remote Fair  Judgment: Intact  Insight: Present   Executive Functions  Concentration: Good  Attention Span: Good  Recall: Good  Fund of Knowledge: Good  Language: Good   Psychomotor Activity  Psychomotor Activity: Psychomotor Activity: Normal   Assets  Assets: Communication Skills; Desire for Improvement   Sleep  Sleep: Sleep: Good    Physical Exam: Physical Exam Vitals and nursing note reviewed.  Constitutional:      Appearance: Normal appearance.  HENT:     Head: Normocephalic and atraumatic.     Nose: Nose normal.  Cardiovascular:     Rate and Rhythm: Normal rate.  Pulmonary:     Effort: Pulmonary effort is normal.  Musculoskeletal:        General: Normal range of motion.     Cervical back: Normal range of motion.  Skin:    General: Skin is warm and dry.  Neurological:     Mental Status: He is alert and oriented to person, place, and time.    Review of Systems  Constitutional: Negative.   HENT: Negative.    Eyes: Negative.   Cardiovascular: Negative.   Gastrointestinal: Negative.   Genitourinary: Negative.   Musculoskeletal: Negative.   Skin: Negative.   Neurological: Negative.   Endo/Heme/Allergies: Negative.   Psychiatric/Behavioral:  Positive for depression.    Blood pressure (!) 167/87, pulse 85, temperature 98 F (36.7 C), temperature source Oral, resp. rate 18, height '5\' 8"'$  (1.727 m), weight 68 kg, SpO2  97 %. Body mass index is 22.79 kg/m.   Demographic Factors:  Male, Adolescent or young adult, Caucasian, Low socioeconomic status, Unemployed, and homeless.  Loss Factors: Financial problems/change in socioeconomic status  Historical Factors: Prior suicide attempts and Impulsivity  Risk Reduction Factors:   Positive social support  Continued Clinical Symptoms:  Depression:   Comorbid alcohol abuse/dependence Insomnia Alcohol/Substance Abuse/Dependencies More than one psychiatric diagnosis  Cognitive Features That Contribute To Risk:  None    Suicide Risk:  Mild:  Suicidal ideation of limited frequency, intensity, duration, and specificity.  There are no identifiable plans, no associated intent, mild dysphoria and related symptoms, good self-control (both  objective and subjective assessment), few other risk factors, and identifiable protective factors, including available and accessible social support.   [place]. Go to.   Specialty: Urgent Care Why: Please visit [facility] if you are experiencing a mental health crisis.   Please call [phone] to schedule an appointment with the [facility]. Contact information: [place] [phone]                Plan Of Care/Follow-up recommendations:  Activity:  as tolerated Diet:  Regular  Medical Decision Making: Patient is and feels much better this morning.  He denies Alcohol withdrawal symptoms.  He denies SI/HI/AVH and states he really want to stop drinking much alcohol.  Patient is Psychiatrically cleared for discharge and outpatient referral is in for him.  Problem 1: Alcohol abuse with Intoxication   Problem 2: Recurrent Major Depressive disorder severe without psychotic features.   Disposition: Psychiatrically cleared.  Delfin Gant,  NP-PMHNP-BC 01/26/2022, 10:53 AM

## 2022-01-27 ENCOUNTER — Other Ambulatory Visit (HOSPITAL_COMMUNITY)
Admission: EM | Admit: 2022-01-27 | Discharge: 2022-02-04 | Disposition: A | Discharge status: Home / Self Care | Payer: Medicaid Other | Attending: Psychiatry | Admitting: Psychiatry

## 2022-01-27 DIAGNOSIS — I10 Essential (primary) hypertension: Secondary | ICD-10-CM | POA: Insufficient documentation

## 2022-01-27 DIAGNOSIS — R7401 Elevation of levels of liver transaminase levels: Secondary | ICD-10-CM

## 2022-01-27 DIAGNOSIS — D509 Iron deficiency anemia, unspecified: Secondary | ICD-10-CM

## 2022-01-27 DIAGNOSIS — Z720 Tobacco use: Secondary | ICD-10-CM

## 2022-01-27 DIAGNOSIS — F17210 Nicotine dependence, cigarettes, uncomplicated: Secondary | ICD-10-CM | POA: Insufficient documentation

## 2022-01-27 DIAGNOSIS — Z5900 Homelessness unspecified: Secondary | ICD-10-CM

## 2022-01-27 DIAGNOSIS — F10129 Alcohol abuse with intoxication, unspecified: Secondary | ICD-10-CM

## 2022-01-27 DIAGNOSIS — R45851 Suicidal ideations: Secondary | ICD-10-CM | POA: Insufficient documentation

## 2022-01-27 DIAGNOSIS — K709 Alcoholic liver disease, unspecified: Secondary | ICD-10-CM

## 2022-01-27 DIAGNOSIS — F1010 Alcohol abuse, uncomplicated: Secondary | ICD-10-CM

## 2022-01-27 DIAGNOSIS — R42 Dizziness and giddiness: Secondary | ICD-10-CM | POA: Insufficient documentation

## 2022-01-27 DIAGNOSIS — Z8719 Personal history of other diseases of the digestive system: Secondary | ICD-10-CM | POA: Insufficient documentation

## 2022-01-27 DIAGNOSIS — F332 Major depressive disorder, recurrent severe without psychotic features: Secondary | ICD-10-CM

## 2022-01-27 DIAGNOSIS — R4589 Other symptoms and signs involving emotional state: Secondary | ICD-10-CM

## 2022-01-27 LAB — RESP PANEL BY RT-PCR (FLU A&B, COVID) ARPGX2
Influenza A by PCR: NEGATIVE
Influenza B by PCR: NEGATIVE
SARS Coronavirus 2 by RT PCR: NEGATIVE

## 2022-01-27 LAB — URINALYSIS, ROUTINE W REFLEX MICROSCOPIC
Bacteria, UA: NONE SEEN
Bilirubin Urine: NEGATIVE
Glucose, UA: NEGATIVE mg/dL
Hgb urine dipstick: NEGATIVE
Ketones, ur: NEGATIVE mg/dL
Nitrite: NEGATIVE
Protein, ur: NEGATIVE mg/dL
Specific Gravity, Urine: 1.024 (ref 1.005–1.030)
pH: 5 (ref 5.0–8.0)

## 2022-01-27 LAB — RAPID URINE DRUG SCREEN, HOSP PERFORMED
Amphetamines: NOT DETECTED
Barbiturates: NOT DETECTED
Benzodiazepines: POSITIVE — AB
Cocaine: NOT DETECTED
Opiates: NOT DETECTED
Tetrahydrocannabinol: NOT DETECTED

## 2022-01-27 MED ORDER — LORAZEPAM 1 MG PO TABS: 1.0000 mg | ORAL_TABLET | Freq: Three times a day (TID) | ORAL | Status: DC

## 2022-01-27 MED ORDER — LORAZEPAM 1 MG PO TABS
1.0000 mg | ORAL_TABLET | Freq: Four times a day (QID) | ORAL | Status: DC
  Administered 2022-01-27 – 2022-01-28 (×2): 1 mg via ORAL
  Filled 2022-01-27 (×2): qty 1

## 2022-01-27 MED ORDER — MAGNESIUM HYDROXIDE 400 MG/5ML PO SUSP: 30.0000 mL | Freq: Every day | ORAL | Status: DC | PRN

## 2022-01-27 MED ORDER — LORAZEPAM 1 MG PO TABS
1.0000 mg | ORAL_TABLET | Freq: Four times a day (QID) | ORAL | Status: AC | PRN
  Filled 2022-01-27: qty 1

## 2022-01-27 MED ORDER — HYDROXYZINE HCL 25 MG PO TABS
25.0000 mg | ORAL_TABLET | Freq: Four times a day (QID) | ORAL | Status: AC | PRN
  Administered 2022-01-29: 25 mg via ORAL
  Filled 2022-01-27: qty 1

## 2022-01-27 MED ORDER — ACETAMINOPHEN 325 MG PO TABS: 650.0000 mg | ORAL_TABLET | Freq: Four times a day (QID) | ORAL | Status: DC | PRN

## 2022-01-27 MED ORDER — LORAZEPAM 1 MG PO TABS: 1.0000 mg | ORAL_TABLET | Freq: Two times a day (BID) | ORAL | Status: DC

## 2022-01-27 MED ORDER — ADULT MULTIVITAMIN W/MINERALS CH
1.0000 | ORAL_TABLET | Freq: Every day | ORAL | Status: DC
  Administered 2022-01-28 – 2022-02-04 (×8): 1 via ORAL
  Filled 2022-01-27 (×8): qty 1

## 2022-01-27 MED ORDER — THIAMINE MONONITRATE 100 MG PO TABS
100.0000 mg | ORAL_TABLET | Freq: Every day | ORAL | Status: DC
  Administered 2022-01-28 – 2022-02-04 (×8): 100 mg via ORAL
  Filled 2022-01-27 (×8): qty 1

## 2022-01-27 MED ORDER — CITALOPRAM HYDROBROMIDE 10 MG PO TABS
10.0000 mg | ORAL_TABLET | Freq: Every day | ORAL | Status: DC
  Administered 2022-01-27: 10 mg via ORAL
  Filled 2022-01-27: qty 1

## 2022-01-27 MED ORDER — ONDANSETRON 4 MG PO TBDP: 4.0000 mg | ORAL_TABLET | Freq: Four times a day (QID) | ORAL | Status: AC | PRN

## 2022-01-27 MED ORDER — THIAMINE HCL 100 MG/ML IJ SOLN
100.0000 mg | Freq: Once | INTRAMUSCULAR | Status: AC
  Administered 2022-01-27: 100 mg via INTRAMUSCULAR
  Filled 2022-01-27: qty 2

## 2022-01-27 MED ORDER — ALUM & MAG HYDROXIDE-SIMETH 200-200-20 MG/5ML PO SUSP: 30.0000 mL | ORAL | Status: DC | PRN

## 2022-01-27 MED ORDER — LOPERAMIDE HCL 2 MG PO CAPS: 2.0000 mg | ORAL_CAPSULE | ORAL | Status: AC | PRN

## 2022-01-27 MED ORDER — LORAZEPAM 1 MG PO TABS: 1.0000 mg | ORAL_TABLET | Freq: Every day | ORAL | Status: DC

## 2022-01-27 NOTE — ED Provider Notes (Signed)
[facility] Continuous Assessment Admission H&P  Date: 01/28/22 Patient Name: Barry Moore MRN: [id number] Chief Complaint: No chief complaint on file.     Diagnoses:  Final diagnoses:  Alcohol abuse  Homelessness  Depressed affect    HPI: Barry Moore,  56 y.o male with a history of alcohol abuse, suicidal ideation presented to [facility] as a transfer from [place]. Per the transfer patient is looking detox.  According to patient he drinks a 40 oz bottle every day and he has been drinking for a long time now, according to patient he smokes 1 pack of cigarettes daily.  Per the patient he is currently homeless he is not currently taking any medications, according to patient he is not seeing a psychiatrist or therapist.  Copied notes from ED: Barry Moore is a 56 y.o. male.  With past medical history of alcohol abuse complicated by pancreatitis, GI bleed, thrombocytopenia, hypertension, homelessness who presents to the emergency department with HI/SI   Patient is agitated on exam.  He provides very limited history.  States that he "needs to commit himself."  He states that "everyone wants to take my money."  He states that he is "homicidal or suicidal, one of the 2."  He does not have a particular plan of how he would do this. When asked about his visit to the emergency department and discharged this morning.  He was seen by behavioral health and felt that inpatient criteria was not met.  Apparently he was intoxicated on 03/26/2021 and was eventually evaluated by behavioral health after he clinically sobered and that time was denying SI/HI/AVH.  He is currently intoxicated and endorses drinking heavily.   Face-to-face observation of patient, patient is alert and oriented x 4, speech is clear, maintaining eye contact.  Mood is depressed affect congruent with mood.  Patient denies current SI, HI, AVH or paranoia at this time.  Patient endorsed alcohol consumption of 40 ounce daily.  Patient denies  marijuana use illicit drug use.  Recommend inpatient observation with further evaluation for [facility]  PHQ 2-9:  [place] ED from 01/19/2022 in [facility] ED from 08/07/2021 in [place] DEPT ED from 09/04/2020 in [place]  Thoughts that you would be better off dead, or of hurting yourself in some way Not at all More than half the days More than half the days  PHQ-9 Total Score _0 Flowsheet Row ED from 01/26/2022 in [place] DEPT ED from 01/24/2022 in [place] DEPT ED from 01/22/2022 in [place] CATEGORY High Risk High Risk High Risk        Total Time spent with patient: 20 minutes  Musculoskeletal  Strength & Muscle Tone: within normal limits Gait & Station: normal Patient leans: N/A  Psychiatric Specialty Exam  Presentation General Appearance:  Casual  Eye Contact: Fair  Speech: Clear and Coherent  Speech Volume: Normal  Handedness: Right   Mood and Affect  Mood: Anxious  Affect: Congruent   Thought Process  Thought Processes: Coherent  Descriptions of Associations:Circumstantial  Orientation:Full (Time, Place and Person)  Thought Content:Logical  Diagnosis of Schizophrenia or Schizoaffective disorder in past: No   Hallucinations:Hallucinations: None  Ideas of Reference:None  Suicidal Thoughts:Suicidal Thoughts: No SI Passive Intent and/or Plan: Without Plan  Homicidal Thoughts:Homicidal Thoughts: No   Sensorium  Memory: Immediate Fair  Judgment: Poor  Insight: Fair   Materials engineer: Fair  Attention Span: Fair  Recall: [employer] of Knowledge: Fair  Language: Fair   Psychomotor Activity  Psychomotor Activity: Psychomotor Activity: Normal   Assets  Assets: Desire for  Improvement; Housing   Sleep  Sleep: Sleep: Fair Number of Hours of Sleep: 6   Nutritional Assessment (For OBS and FBC admissions only) Has the patient had a weight loss or gain of 10 pounds or more in the last 3 months?: No Has the patient had a decrease in food intake/or appetite?: No Does the patient have dental problems?: No Does the patient have eating habits or behaviors that may be indicators of an eating disorder including binging or inducing vomiting?: No Has the patient recently lost weight without trying?: 0 Has the patient been eating poorly because of a decreased appetite?: 0 Malnutrition Screening Tool Score: 0    Physical Exam HENT:     Head: Normocephalic.     Nose: Nose normal.  Pulmonary:     Effort: Pulmonary effort is normal.  Musculoskeletal:        General: Normal range of motion.     Cervical back: Normal range of motion.  Neurological:     General: No focal deficit present.     Mental Status: He is alert.  Psychiatric:        Mood and Affect: Mood normal.        Behavior: Behavior normal.        Thought Content: Thought content normal.        Judgment: Judgment normal.    Review of Systems  Constitutional: Negative.   HENT: Negative.    Eyes: Negative.   Respiratory: Negative.    Cardiovascular: Negative.   Gastrointestinal: Negative.   Genitourinary: Negative.   Musculoskeletal: Negative.   Skin: Negative.   Neurological: Negative.   Endo/Heme/Allergies: Negative.   Psychiatric/Behavioral:  Positive for substance abuse. The patient is nervous/anxious.     Blood pressure (!) 144/100, pulse 86, temperature 98.8 F (37.1 C), temperature source Oral, resp. rate 18, SpO2 99 %. There is no height or weight on file to calculate BMI.  Past Psychiatric History: alcohol abuse,  SI   Is the patient at risk to self? No  Has the patient been a risk to self in the past 6 months? No .    Has the patient been a risk to self within the distant  past? No   Is the patient a risk to others? No   Has the patient been a risk to others in the past 6 months? No   Has the patient been a risk to others within the distant past? No   Past Medical History:  Past Medical History:  Diagnosis Date   Alcoholic hepatitis    Depression    ETOH abuse    Gastric bleed 08/2018   Hypertension    Pancreatitis    Seizures ([place])    Suicidal behavior     Past Surgical History:  Procedure Laterality Date   BIOPSY  08/05/2018   Procedure: BIOPSY;  Surgeon: Irving Copas., MD;  Location: [place];  Service: Gastroenterology;;   BIOPSY  08/07/2018   Procedure: BIOPSY;  Surgeon: Thornton Park, MD;  Location: [facility] ENDOSCOPY;  Service: Gastroenterology;;   BIOPSY  01/02/2019   Procedure: BIOPSY;  Surgeon: Gatha Mayer, MD;  Location: [facility] ENDOSCOPY;  Service: Endoscopy;;   COLONOSCOPY WITH PROPOFOL N/A 08/07/2018   Procedure:  COLONOSCOPY WITH PROPOFOL;  Surgeon: [place] Park, MD;  Location: [place];  Service: Gastroenterology;  Laterality: N/A;   ESOPHAGOGASTRODUODENOSCOPY N/A 01/02/2019   Procedure: ESOPHAGOGASTRODUODENOSCOPY (EGD);  Surgeon: Gatha Mayer, MD;  Location: [facility];  Service: Endoscopy;  Laterality: N/A;   ESOPHAGOGASTRODUODENOSCOPY N/A 07/03/2019   Procedure: ESOPHAGOGASTRODUODENOSCOPY (EGD);  Surgeon: Wonda Horner, MD;  Location: [facility] ENDOSCOPY;  Service: Endoscopy;  Laterality: N/A;   ESOPHAGOGASTRODUODENOSCOPY N/A 06/26/2020   Procedure: ESOPHAGOGASTRODUODENOSCOPY (EGD);  Surgeon: Arta Silence, MD;  Location: [facility] ENDOSCOPY;  Service: Endoscopy;  Laterality: N/A;   ESOPHAGOGASTRODUODENOSCOPY (EGD) WITH PROPOFOL N/A 11/02/2016   Procedure: ESOPHAGOGASTRODUODENOSCOPY (EGD) WITH PROPOFOL;  Surgeon: Carol Ada, MD;  Location: [facility] ENDOSCOPY;  Service: Endoscopy;  Laterality: N/A;   ESOPHAGOGASTRODUODENOSCOPY (EGD) WITH PROPOFOL N/A 08/05/2018   Procedure: ESOPHAGOGASTRODUODENOSCOPY (EGD) WITH PROPOFOL;   Surgeon: Rush Landmark Telford Nab., MD;  Location: [place];  Service: Gastroenterology;  Laterality: N/A;   ESOPHAGOGASTRODUODENOSCOPY (EGD) WITH PROPOFOL N/A 07/14/2019   Procedure: ESOPHAGOGASTRODUODENOSCOPY (EGD) WITH PROPOFOL;  Surgeon: Otis Brace, MD;  Location: [facility] ENDOSCOPY;  Service: Gastroenterology;  Laterality: N/A;   HERNIA REPAIR     LEG SURGERY     POLYPECTOMY  08/07/2018   Procedure: POLYPECTOMY;  Surgeon: Thornton Park, MD;  Location[facility] END[facility]COPY;  Service: Gastroenterology;;    Family History:  Family History  Problem Relation Age of Onset   Diabetes Mother    Alcoholism Mother    Emphysema Father    Lung cancer Father    Alcoholism Father     Social History:  Social History   Socioeconomic History   Marital status: Divorced    Spouse name: Not on file   Number of children: Not on file   Years of education: Not on file   Highest education level: Not on file  Occupational History   Not on file  Tobacco Use   Smoking status: Every Day    Packs/day: 1.00    Types: Cigarettes   Smokeless tobacco: Never  Vaping Use   Vaping Use: Never used  Substance and Sexual Activity   Alcohol use: Yes    Comment: 5+ BEERS DAILY, Patient does not know how much he drank tonight   Drug use: Not Currently    Frequency: 3.0 times per week    Types: Cocaine   Sexual activity: Not Currently  Other Topics Concern   Not on file  Social History Narrative   Not on file   Social Determinants of Health   Financial Resource Strain: Not on file  Food Insecurity: Not on file  Transportation Needs: Not on file  Physical Activity: Not on file  Stress: Not on file  Social Connections: Not on file  Intimate Partner Violence: Not on file    SDOH:  SDOH Screenings   Alcohol Screen: High Risk (07/16/2021)  Depression (PHQ2-9): Low Risk  (01/21/2022)  Recent Concern: Depression (PHQ2-9) - High Risk (01/20/2022)  Tobacco Use: High Risk (01/26/2022)    Last Labs:   Admission on 01/26/2022, Discharged on 01/27/2022  Component Date Value Ref Range Status   WBC 01/26/2022 5.8  4.0 - 10.5 K/uL Final   RBC 01/26/2022 4.05 (L)  4.22 - 5.81 MIL/uL Final   Hemoglobin 01/26/2022 10.9 (L)  13.0 - 17.0 g/dL Final   HCT 01/26/2022 34.5 (L)  39.0 - 52.0 % Final   MCV 01/26/2022 85.2  80.0 - 100.0 fL Final   MCH 01/26/2022 26.9  26.0 - 34.0 pg Final   MCHC 01/26/2022 31.6  30.0 - 36.0 g/dL Final   RDW 01/26/2022 16.0 (H)  11.5 - 15.5 % Final   Platelets 01/26/2022 192  150 - 400 K/uL Final   nRBC 01/26/2022 0.0  0.0 - 0.2 % Final   Neutrophils Relative % 01/26/2022 44  % Final   Neutro Abs 01/26/2022 2.5  1.7 - 7.7 K/uL Final   Lymphocytes Relative 01/26/2022 38  % Final   Lymphs Abs 01/26/2022 2.2  0.7 - 4.0 K/uL Final   Monocytes Relative 01/26/2022 15  % Final   Monocytes Absolute 01/26/2022 0.9  0.1 - 1.0 K/uL Final   Eosinophils Relative 01/26/2022 2  % Final   Eosinophils Absolute 01/26/2022 0.1  0.0 - 0.5 K/uL Final   Basophils Relative 01/26/2022 1  % Final   Basophils Absolute 01/26/2022 0.1  0.0 - 0.1 K/uL Final   Immature Granulocytes 01/26/2022 0  % Final   Abs Immature Granulocytes 01/26/2022 0.02  0.00 - 0.07 K/uL Final   Performed at [facility], [place] [address], [place], [place] [address]   Sodium 01/26/2022 136  135 - 145 mmol/L Final   Potassium 01/26/2022 3.7  3.5 - 5.1 mmol/L Final   Chloride 01/26/2022 105  98 - 111 mmol/L Final   CO2 01/26/2022 21 (L)  22 - 32 mmol/L Final   Glucose, Bld 01/26/2022 109 (H)  70 - 99 mg/dL Final   Glucose reference range applies only to samples taken after fasting for at least 8 hours.   BUN 01/26/2022 9  6 - 20 mg/dL Final   Creatinine, Ser 01/26/2022 0.59 (L)  0.61 - 1.24 mg/dL Final   Calcium 01/26/2022 9.5  8.9 - 10.3 mg/dL Final   Total Protein 01/26/2022 8.3 (H)  6.5 - 8.1 g/dL Final   Albumin 01/26/2022 4.2  3.5 - 5.0 g/dL Final   AST 01/26/2022 153 (H)  15 - 41 U/L Final    ALT 01/26/2022 90 (H)  0 - 44 U/L Final   Alkaline Phosphatase 01/26/2022 133 (H)  38 - 126 U/L Final   Total Bilirubin 01/26/2022 0.5  0.3 - 1.2 mg/dL Final   GFR, Estimated 01/26/2022 >60  >60 mL/min Final   Comment: (NOTE) Calculated using the CKD-EPI Creatinine Equation (2021)    Anion gap 01/26/2022 10  5 - 15 Final   Performed at [facility], [place] [address] [place], [place] [address]   Magnesium 01/26/2022 2.5 (H)  1.7 - 2.4 mg/dL Final   Performed at [place] [address], [place], [place] [address]   Alcohol, Ethyl (B) 01/26/2022 336 (HH)  <10 mg/dL Final   Comment: CRITICAL RESULT CALLED TO, READ BACK BY AND VERIFIED WITH ROHR,A AT 2128 ON 01/26/22 BY VAZQUEZJ (NOTE) Lowest detectable limit for serum alcohol is 10 mg/dL.  For medical purposes only. Performed at [facility], [place] [address] [place], [place] [address]    Opiates 01/27/2022 NONE DETECTED  NONE DETECTED Final   Cocaine 01/27/2022 NONE DETECTED  NONE DETECTED Final   Benzodiazepines 01/27/2022 POSITIVE (A)  NONE DETECTED Final   Amphetamines 01/27/2022 NONE DETECTED  NONE DETECTED Final   Tetrahydrocannabinol 01/27/2022 NONE DETECTED  NONE DETECTED Final   Barbiturates 01/27/2022 NONE DETECTED  NONE DETECTED Final   Comment: (NOTE) DRUG SCREEN FOR MEDICAL PURPOSES ONLY.  IF CONFIRMATION IS NEEDED FOR ANY PURPOSE, NOTIFY LAB WITHIN 5 DAYS.  LOWEST DETECTABLE LIMITS FOR URINE DRUG SCREEN Drug Class  Cutoff (ng/mL) Amphetamine and metabolites    1000 Barbiturate and metabolites    200 Benzodiazepine                 200 Opiates and metabolites        300 Cocaine and metabolites        300 THC                            50 Performed at [facility], [place] [address], [place], [place] [address]    Color, Urine 01/27/2022 YELLOW  YELLOW Final   APPearance 01/27/2022 CLEAR  CLEAR Final   Specific Gravity,  Urine 01/27/2022 1.024  1.005 - 1.030 Final   pH 01/27/2022 5.0  5.0 - 8.0 Final   Glucose, UA 01/27/2022 NEGATIVE  NEGATIVE mg/dL Final   Hgb urine dipstick 01/27/2022 NEGATIVE  NEGATIVE Final   Bilirubin Urine 01/27/2022 NEGATIVE  NEGATIVE Final   Ketones, ur 01/27/2022 NEGATIVE  NEGATIVE mg/dL Final   Protein, ur 01/27/2022 NEGATIVE  NEGATIVE mg/dL Final   Nitrite 01/27/2022 NEGATIVE  NEGATIVE Final   Leukocytes,Ua 01/27/2022 TRACE (A)  NEGATIVE Final   RBC / HPF 01/27/2022 0-5  0 - 5 RBC/hpf Final   WBC, UA 01/27/2022 6-10  0 - 5 WBC/hpf Final   Bacteria, UA 01/27/2022 NONE SEEN  NONE SEEN Final   Squamous Epithelial / LPF 01/27/2022 0-5  0 - 5 Final   Mucus 01/27/2022 PRESENT   Final   Performed at [facility], [place] [address], [place], [place] [address]   SARS Coronavirus 2 by RT PCR 01/27/2022 NEGATIVE  NEGATIVE Final   Comment: (NOTE) SARS-CoV-2 target nucleic acids are NOT DETECTED.  The SARS-CoV-2 RNA is generally detectable in upper respiratory specimens during the acute phase of infection. The lowest concentration of SARS-CoV-2 viral copies this assay can detect is 138 copies/mL. A negative result does not preclude SARS-Cov-2 infection and should not be used as the sole basis for treatment or other patient management decisions. A negative result may occur with  improper specimen collection/handling, submission of specimen other than nasopharyngeal swab, presence of viral mutation(s) within the areas targeted by this assay, and inadequate number of viral copies(<138 copies/mL). A negative result must be combined with clinical observations, patient history, and epidemiological information. The expected result is Negative.  Fact Sheet for Patients:  EntrepreneurPulse.com.au  Fact Sheet for Healthcare Providers:  IncredibleEmployment.be  This test is no                          t yet approved or cleared by the [place] FDA and  has been authorized for detection and/or diagnosis of SARS-CoV-2 by FDA under an Emergency Use Authorization (EUA). This EUA will remain  in effect (meaning this test can be used) for the duration of the COVID-19 declaration under Section 564(b)(1) of the Act, 21 U.S.C.section 360bbb-3(b)(1), unless the authorization is terminated  or revoked sooner.       Influenza A by PCR 01/27/2022 NEGATIVE  NEGATIVE Final   Influenza B by PCR 01/27/2022 NEGATIVE  NEGATIVE Final   Comment: (NOTE) The Xpert Xpress SARS-CoV-2/FLU/RSV plus assay is intended as an aid in the diagnosis of influenza from Nasopharyngeal swab specimens and should not be used as a sole basis for treatment. Nasal washings and aspirates are unacceptable for Xpert Xpress SARS-CoV-2/FLU/RSV testing.  Fact Sheet for Patients:  EntrepreneurPulse.com.au  Fact Sheet for Healthcare Providers: IncredibleEmployment.be  This test is not yet approved or cleared by the [place] FDA and has been authorized for detection and/or diagnosis of SARS-CoV-2 by FDA under an Emergency Use Authorization (EUA). This EUA will remain in effect (meaning this test can be used) for the duration of the COVID-19 declaration under Section 564(b)(1) of the Act, 21 U.S.C. section 360bbb-3(b)(1), unless the authorization is terminated or revoked.  Performed at [facility], [place] [address], [place], [place] [address]   Admission on 01/24/2022, Discharged on 01/26/2022  Component Date Value Ref Range Status   Sodium 01/24/2022 139  135 - 145 mmol/L Final   Potassium 01/24/2022 3.8  3.5 - 5.1 mmol/L Final   Chloride 01/24/2022 107  98 - 111 mmol/L Final   CO2 01/24/2022 24  22 - 32 mmol/L Final   Glucose, Bld 01/24/2022 95  70 - 99 mg/dL Final   Glucose reference range applies only to samples taken after fasting for at least 8 hours.   BUN 01/24/2022 10  6 - 20 mg/dL Final    Creatinine, Ser 01/24/2022 0.79  0.61 - 1.24 mg/dL Final   Calcium 01/24/2022 8.4 (L)  8.9 - 10.3 mg/dL Final   Total Protein 01/24/2022 7.5  6.5 - 8.1 g/dL Final   Albumin 01/24/2022 3.8  3.5 - 5.0 g/dL Final   AST 01/24/2022 127 (H)  15 - 41 U/L Final   ALT 01/24/2022 78 (H)  0 - 44 U/L Final   Alkaline Phosphatase 01/24/2022 142 (H)  38 - 126 U/L Final   Total Bilirubin 01/24/2022 0.4  0.3 - 1.2 mg/dL Final   GFR, Estimated 01/24/2022 >60  >60 mL/min Final   Comment: (NOTE) Calculated using the CKD-EPI Creatinine Equation (2021)    Anion gap 01/24/2022 8  5 - 15 Final   Performed at [facility], [place] [address], [place], [place] [address]   Alcohol, Ethyl (B) 01/24/2022 268 (H)  <10 mg/dL Final   Comment: (NOTE) Lowest detectable limit for serum alcohol is 10 mg/dL.  For medical purposes only. Performed at [facility], [place] [address] [place], [place] [address]    Opiates 01/25/2022 NONE DETECTED  NONE DETECTED Final   Cocaine 01/25/2022 NONE DETECTED  NONE DETECTED Final   Benzodiazepines 01/25/2022 NONE DETECTED  NONE DETECTED Final   Amphetamines 01/25/2022 NONE DETECTED  NONE DETECTED Final   Tetrahydrocannabinol 01/25/2022 NONE DETECTED  NONE DETECTED Final   Barbiturates 01/25/2022 NONE DETECTED  NONE DETECTED Final   Comment: (NOTE) DRUG SCREEN FOR MEDICAL PURPOSES ONLY.  IF CONFIRMATION IS NEEDED FOR ANY PURPOSE, NOTIFY LAB WITHIN 5 DAYS.  LOWEST DETECTABLE LIMITS FOR URINE DRUG SCREEN Drug Class                     Cutoff (ng/mL) Amphetamine and metabolites    1000 Barbiturate and metabolites    200 Benzodiazepine                 200 Opiates and metabolites        300 Cocaine and metabolites        300 THC                            50 Performed at [facility], [place] [address] [place], [place] [address]    WBC 01/24/2022 4.8  4.0 - 10.5 K/uL  Final   RBC 01/24/2022 3.48 (L)  4.22 - 5.81 MIL/uL Final    Hemoglobin 01/24/2022 9.5 (L)  13.0 - 17.0 g/dL Final   HCT 01/24/2022 30.0 (L)  39.0 - 52.0 % Final   MCV 01/24/2022 86.2  80.0 - 100.0 fL Final   MCH 01/24/2022 27.3  26.0 - 34.0 pg Final   MCHC 01/24/2022 31.7  30.0 - 36.0 g/dL Final   RDW 01/24/2022 16.6 (H)  11.5 - 15.5 % Final   Platelets 01/24/2022 165  150 - 400 K/uL Final   nRBC 01/24/2022 0.0  0.0 - 0.2 % Final   Neutrophils Relative % 01/24/2022 33  % Final   Neutro Abs 01/24/2022 1.6 (L)  1.7 - 7.7 K/uL Final   Lymphocytes Relative 01/24/2022 47  % Final   Lymphs Abs 01/24/2022 2.3  0.7 - 4.0 K/uL Final   Monocytes Relative 01/24/2022 15  % Final   Monocytes Absolute 01/24/2022 0.7  0.1 - 1.0 K/uL Final   Eosinophils Relative 01/24/2022 3  % Final   Eosinophils Absolute 01/24/2022 0.2  0.0 - 0.5 K/uL Final   Basophils Relative 01/24/2022 2  % Final   Basophils Absolute 01/24/2022 0.1  0.0 - 0.1 K/uL Final   Immature Granulocytes 01/24/2022 0  % Final   Abs Immature Granulocytes 01/24/2022 0.00  0.00 - 0.07 K/uL Final   Performed at [facility], [place] [address], [place], [place] [address]   WBC 01/25/2022 4.6  4.0 - 10.5 K/uL Final   RBC 01/25/2022 4.00 (L)  4.22 - 5.81 MIL/uL Final   Hemoglobin 01/25/2022 10.6 (L)  13.0 - 17.0 g/dL Final   HCT 01/25/2022 34.3 (L)  39.0 - 52.0 % Final   MCV 01/25/2022 85.8  80.0 - 100.0 fL Final   MCH 01/25/2022 26.5  26.0 - 34.0 pg Final   MCHC 01/25/2022 30.9  30.0 - 36.0 g/dL Final   RDW 01/25/2022 16.5 (H)  11.5 - 15.5 % Final   Platelets 01/25/2022 199  150 - 400 K/uL Final   nRBC 01/25/2022 0.0  0.0 - 0.2 % Final   Performed at [facility], [place] [address], [place], [place] [address]  Admission on 01/22/2022, Discharged on 01/23/2022  Component Date Value Ref Range Status   Sodium 01/22/2022 139  135 - 145 mmol/L Final   Potassium 01/22/2022 4.2  3.5 - 5.1 mmol/L Final   Chloride 01/22/2022 102  98 - 111 mmol/L Final   CO2 01/22/2022 21  (L)  22 - 32 mmol/L Final   Glucose, Bld 01/22/2022 105 (H)  70 - 99 mg/dL Final   Glucose reference range applies only to samples taken after fasting for at least 8 hours.   BUN 01/22/2022 8  6 - 20 mg/dL Final   Creatinine, Ser 01/22/2022 0.72  0.61 - 1.24 mg/dL Final   Calcium 01/22/2022 9.7  8.9 - 10.3 mg/dL Final   Total Protein 01/22/2022 8.0  6.5 - 8.1 g/dL Final   Albumin 01/22/2022 4.1  3.5 - 5.0 g/dL Final   AST 01/22/2022 216 (H)  15 - 41 U/L Final   ALT 01/22/2022 115 (H)  0 - 44 U/L Final   Alkaline Phosphatase 01/22/2022 186 (H)  38 - 126 U/L Final   Total Bilirubin 01/22/2022 0.5  0.3 - 1.2 mg/dL Final   GFR, Estimated 01/22/2022 >60  >60 mL/min Final   Comment: (NOTE) Calculated using the CKD-EPI Creatinine Equation (2021)    Anion gap  01/22/2022 16 (H)  5 - 15 Final   Performed at [facility], [place] [address], [place], [place] [address]   WBC 01/22/2022 5.9  4.0 - 10.5 K/uL Final   RBC 01/22/2022 4.09 (L)  4.22 - 5.81 MIL/uL Final   Hemoglobin 01/22/2022 10.9 (L)  13.0 - 17.0 g/dL Final   HCT 01/22/2022 35.1 (L)  39.0 - 52.0 % Final   MCV 01/22/2022 85.8  80.0 - 100.0 fL Final   MCH 01/22/2022 26.7  26.0 - 34.0 pg Final   MCHC 01/22/2022 31.1  30.0 - 36.0 g/dL Final   RDW 01/22/2022 16.5 (H)  11.5 - 15.5 % Final   Platelets 01/22/2022 192  150 - 400 K/uL Final   nRBC 01/22/2022 0.0  0.0 - 0.2 % Final   Neutrophils Relative % 01/22/2022 43  % Final   Neutro Abs 01/22/2022 2.5  1.7 - 7.7 K/uL Final   Lymphocytes Relative 01/22/2022 38  % Final   Lymphs Abs 01/22/2022 2.2  0.7 - 4.0 K/uL Final   Monocytes Relative 01/22/2022 16  % Final   Monocytes Absolute 01/22/2022 0.9  0.1 - 1.0 K/uL Final   Eosinophils Relative 01/22/2022 2  % Final   Eosinophils Absolute 01/22/2022 0.1  0.0 - 0.5 K/uL Final   Basophils Relative 01/22/2022 1  % Final   Basophils Absolute 01/22/2022 0.1  0.0 - 0.1 K/uL Final   Immature Granulocytes 01/22/2022 0  % Final   Abs Immature  Granulocytes 01/22/2022 0.02  0.00 - 0.07 K/uL Final   Performed at [facility], [place] [address], [place], [place] [address]   Alcohol, Ethyl (B) 01/22/2022 340 (HH)  <10 mg/dL Final   Comment: CRITICAL RESULT CALLED TO, READ BACK BY AND VERIFIED WITH Assunta Found, RN, 2232 01/22/22, A. RAMSEY (NOTE) Lowest detectable limit for serum alcohol is 10 mg/dL.  For medical purposes only. Performed at [facility], [place] [address], [place], [place] [address]    Opiates 01/22/2022 NONE DETECTED  NONE DETECTED Final   Cocaine 01/22/2022 NONE DETECTED  NONE DETECTED Final   Benzodiazepines 01/22/2022 NONE DETECTED  NONE DETECTED Final   Amphetamines 01/22/2022 NONE DETECTED  NONE DETECTED Final   Tetrahydrocannabinol 01/22/2022 NONE DETECTED  NONE DETECTED Final   Barbiturates 01/22/2022 NONE DETECTED  NONE DETECTED Final   Comment: (NOTE) DRUG SCREEN FOR MEDICAL PURPOSES ONLY.  IF CONFIRMATION IS NEEDED FOR ANY PURPOSE, NOTIFY LAB WITHIN 5 DAYS.  LOWEST DETECTABLE LIMITS FOR URINE DRUG SCREEN Drug Class                     Cutoff (ng/mL) Amphetamine and metabolites    1000 Barbiturate and metabolites    200 Benzodiazepine                 200 Opiates and metabolites        300 Cocaine and metabolites        300 THC                            50 Performed at [facility], [place] [address] [place], [place] [address]    Color, Urine 01/22/2022 COLORLESS (A)  YELLOW Final   APPearance 01/22/2022 CLEAR  CLEAR Final   Specific Gravity, Urine 01/22/2022 1.002 (L)  1.005 - 1.030 Final   pH 01/22/2022 5.0  5.0 - 8.0 Final   Glucose, UA 01/22/2022  NEGATIVE  NEGATIVE mg/dL Final   Hgb urine dipstick 01/22/2022 NEGATIVE  NEGATIVE Final   Bilirubin Urine 01/22/2022 NEGATIVE  NEGATIVE Final   Ketones, ur 01/22/2022 NEGATIVE  NEGATIVE mg/dL Final   Protein, ur 01/22/2022 NEGATIVE  NEGATIVE mg/dL Final   Nitrite 01/22/2022 NEGATIVE  NEGATIVE Final   Leukocytes,Ua 01/22/2022 NEGATIVE   NEGATIVE Final   Performed at [facility], [place] [address] [place], [place] [address]  Admission on 01/19/2022, Discharged on 01/21/2022  Component Date Value Ref Range Status   SARS Coronavirus 2 by RT PCR 01/19/2022 NEGATIVE  NEGATIVE Final   Comment: (NOTE) SARS-CoV-2 target nucleic acids are NOT DETECTED.  The SARS-CoV-2 RNA is generally detectable in upper respiratory specimens during the acute phase of infection. The lowest concentration of SARS-CoV-2 viral copies this assay can detect is 138 copies/mL. A negative result does not preclude SARS-Cov-2 infection and should not be used as the sole basis for treatment or other patient management decisions. A negative result may occur with  improper specimen collection/handling, submission of specimen other than nasopharyngeal swab, presence of viral mutation(s) within the areas targeted by this assay, and inadequate number of viral copies(<138 copies/mL). A negative result must be combined with clinical observations, patient history, and epidemiological information. The expected result is Negative.  Fact Sheet for Patients:  EntrepreneurPulse.com.au  Fact Sheet for Healthcare Providers:  IncredibleEmployment.be  This test is no                          t yet approved or cleared by the [place] FDA and  has been authorized for detection and/or diagnosis of SARS-CoV-2 by FDA under an Emergency Use Authorization (EUA). This EUA will remain  in effect (meaning this test can be used) for the duration of the COVID-19 declaration under Section 564(b)(1) of the Act, 21 U.S.C.section 360bbb-3(b)(1), unless the authorization is terminated  or revoked sooner.       Influenza A by PCR 01/19/2022 NEGATIVE  NEGATIVE Final   Influenza B by PCR 01/19/2022 NEGATIVE  NEGATIVE Final   Comment: (NOTE) The Xpert Xpress SARS-CoV-2/FLU/RSV plus assay is intended as an aid in the diagnosis of  influenza from Nasopharyngeal swab specimens and should not be used as a sole basis for treatment. Nasal washings and aspirates are unacceptable for Xpert Xpress SARS-CoV-2/FLU/RSV testing.  Fact Sheet for Patients: EntrepreneurPulse.com.au  Fact Sheet for Healthcare Providers: IncredibleEmployment.be  This test is not yet approved or cleared by the [place] FDA and has been authorized for detection and/or diagnosis of SARS-CoV-2 by FDA under an Emergency Use Authorization (EUA). This EUA will remain in effect (meaning this test can be used) for the duration of the COVID-19 declaration under Section 564(b)(1) of the Act, 21 U.S.C. section 360bbb-3(b)(1), unless the authorization is terminated or revoked.  Performed at [facility], [place] [address] [place], [place] [address]    WBC 01/19/2022 4.8  4.0 - 10.5 K/uL Final   RBC 01/19/2022 3.95 (L)  4.22 - 5.81 MIL/uL Final   Hemoglobin 01/19/2022 10.8 (L)  13.0 - 17.0 g/dL Final   HCT 01/19/2022 33.2 (L)  39.0 - 52.0 % Final   MCV 01/19/2022 84.1  80.0 - 100.0 fL Final   MCH 01/19/2022 27.3  26.0 - 34.0 pg Final   MCHC 01/19/2022 32.5  30.0 - 36.0 g/dL Final   RDW 01/19/2022 16.7 (H)  11.5 - 15.5 % Final   Platelets 01/19/2022  161  150 - 400 K/uL Final   nRBC 01/19/2022 0.0  0.0 - 0.2 % Final   Neutrophils Relative % 01/19/2022 40  % Final   Neutro Abs 01/19/2022 2.0  1.7 - 7.7 K/uL Final   Lymphocytes Relative 01/19/2022 38  % Final   Lymphs Abs 01/19/2022 1.8  0.7 - 4.0 K/uL Final   Monocytes Relative 01/19/2022 17  % Final   Monocytes Absolute 01/19/2022 0.8  0.1 - 1.0 K/uL Final   Eosinophils Relative 01/19/2022 3  % Final   Eosinophils Absolute 01/19/2022 0.1  0.0 - 0.5 K/uL Final   Basophils Relative 01/19/2022 2  % Final   Basophils Absolute 01/19/2022 0.1  0.0 - 0.1 K/uL Final   Immature Granulocytes 01/19/2022 0  % Final   Abs Immature Granulocytes 01/19/2022 0.02  0.00 -  0.07 K/uL Final   Performed at [facility], [place] [address] [place], [place] [address]   Sodium 01/19/2022 138  135 - 145 mmol/L Final   Potassium 01/19/2022 3.8  3.5 - 5.1 mmol/L Final   Chloride 01/19/2022 102  98 - 111 mmol/L Final   CO2 01/19/2022 22  22 - 32 mmol/L Final   Glucose, Bld 01/19/2022 89  70 - 99 mg/dL Final   Glucose reference range applies only to samples taken after fasting for at least 8 hours.   BUN 01/19/2022 7  6 - 20 mg/dL Final   Creatinine, Ser 01/19/2022 0.64  0.61 - 1.24 mg/dL Final   Calcium 01/19/2022 9.2  8.9 - 10.3 mg/dL Final   Total Protein 01/19/2022 8.1  6.5 - 8.1 g/dL Final   Albumin 01/19/2022 4.0  3.5 - 5.0 g/dL Final   AST 01/19/2022 140 (H)  15 - 41 U/L Final   ALT 01/19/2022 68 (H)  0 - 44 U/L Final   Alkaline Phosphatase 01/19/2022 144 (H)  38 - 126 U/L Final   Total Bilirubin 01/19/2022 0.4  0.3 - 1.2 mg/dL Final   GFR, Estimated 01/19/2022 >60  >60 mL/min Final   Comment: (NOTE) Calculated using the CKD-EPI Creatinine Equation (2021)    Anion gap 01/19/2022 14  5 - 15 Final   Performed at [facility] [address] [place], [place] [address]   Hgb A1c MFr Bld 01/19/2022 5.3  4.8 - 5.6 % Final   Comment: (NOTE) Pre diabetes:          5.7%-6.4%  Diabetes:              >6.4%  Glycemic control for   <7.0% adults with diabetes    Mean Plasma Glucose 01/19/2022 105.41  mg/dL Final   Performed at [facility], [place] [address], [place], [place] [address]   Alcohol, Ethyl (B) 01/19/2022 290 (H)  <10 mg/dL Final   Comment: (NOTE) Lowest detectable limit for serum alcohol is 10 mg/dL.  For medical purposes only. Performed at [facility], [place] [address] [place], [place] [address]    Cholesterol 01/19/2022 251 (H)  0 - 200 mg/dL Final   Triglycerides 01/19/2022 163 (H)  <150 mg/dL Final   HDL 01/19/2022 105  >40 mg/dL Final   Total CHOL/HDL Ratio 01/19/2022 2.4  RATIO Final   VLDL 01/19/2022 33  0 - 40 mg/dL  Final   LDL Cholesterol 01/19/2022 113 (H)  0 - 99 mg/dL Final   Comment:        Total Cholesterol/HDL:CHD Risk Coronary Heart Disease Risk Table  Men   Women  1/2 Average Risk   3.4   3.3  Average Risk       5.0   4.4  2 X Average Risk   9.6   7.1  3 X Average Risk  23.4   11.0        Use the calculated Patient Ratio above and the CHD Risk Table to determine the patient's CHD Risk.        ATP III CLASSIFICATION (LDL):  <100     mg/dL   Optimal  100-129  mg/dL   Near or Above                    Optimal  130-159  mg/dL   Borderline  160-189  mg/dL   High  >190     mg/dL   Very High Performed at [place] [address] [place], [place] [address]    TSH 01/19/2022 0.415  0.350 - 4.500 uIU/mL Final   Comment: Performed by a 3rd Generation assay with a functional sensitivity of <=0.01 uIU/mL. Performed at [facility], [place] [address], [place], [place] [address]    POC Amphetamine UR 01/19/2022 None Detected  NONE DETECTED (Cut Off Level 1000 ng/mL) Final   POC Secobarbital (BAR) 01/19/2022 None Detected  NONE DETECTED (Cut Off Level 300 ng/mL) Final   POC Buprenorphine (BUP) 01/19/2022 None Detected  NONE DETECTED (Cut Off Level 10 ng/mL) Final   POC Oxazepam (BZO) 01/19/2022 None Detected  NONE DETECTED (Cut Off Level 300 ng/mL) Final   POC Cocaine UR 01/19/2022 None Detected  NONE DETECTED (Cut Off Level 300 ng/mL) Final   POC Methamphetamine UR 01/19/2022 None Detected  NONE DETECTED (Cut Off Level 1000 ng/mL) Final   POC Morphine 01/19/2022 None Detected  NONE DETECTED (Cut Off Level 300 ng/mL) Final   POC Methadone UR 01/19/2022 None Detected  NONE DETECTED (Cut Off Level 300 ng/mL) Final   POC Oxycodone UR 01/19/2022 None Detected  NONE DETECTED (Cut Off Level 100 ng/mL) Final   POC Marijuana UR 01/19/2022 None Detected  NONE DETECTED (Cut Off Level 50 ng/mL) Final   SARSCOV2ONAVIRUS 2 AG 01/19/2022 NEGATIVE  NEGATIVE Final   Comment:  (NOTE) SARS-CoV-2 antigen NOT DETECTED.   Negative results are presumptive.  Negative results do not preclude SARS-CoV-2 infection and should not be used as the sole basis for treatment or other patient management decisions, including infection  control decisions, particularly in the presence of clinical signs and  symptoms consistent with COVID-19, or in those who have been in contact with the virus.  Negative results must be combined with clinical observations, patient history, and epidemiological information. The expected result is Negative.  Fact Sheet for Patients: HandmadeRecipes.com.cy  Fact Sheet for Healthcare Providers: FuneralLife.at  This test is not yet approved or cleared by the [place] FDA and  has been authorized for detection and/or diagnosis of SARS-CoV-2 by FDA under an Emergency Use Authorization (EUA).  This EUA will remain in effect (meaning this test can be used) for the duration of  the COV                          ID-19 declaration under Section 564(b)(1) of the Act, 21 U.S.C. section 360bbb-3(b)(1), unless the authorization is terminated or revoked sooner.    Admission on 01/11/2022, Discharged on 01/12/2022  Component Date Value Ref Range Status   WBC 01/11/2022 6.0  4.0 - 10.5 K/uL Final   RBC 01/11/2022 3.96 (L)  4.22 - 5.81 MIL/uL Final   Hemoglobin 01/11/2022 11.1 (L)  13.0 - 17.0 g/dL Final   HCT 01/11/2022 33.1 (L)  39.0 - 52.0 % Final   MCV 01/11/2022 83.6  80.0 - 100.0 fL Final   MCH 01/11/2022 28.0  26.0 - 34.0 pg Final   MCHC 01/11/2022 33.5  30.0 - 36.0 g/dL Final   RDW 01/11/2022 17.0 (H)  11.5 - 15.5 % Final   Platelets 01/11/2022 115 (L)  150 - 400 K/uL Final   nRBC 01/11/2022 0.0  0.0 - 0.2 % Final   Neutrophils Relative % 01/11/2022 54  % Final   Neutro Abs 01/11/2022 3.3  1.7 - 7.7 K/uL Final   Lymphocytes Relative 01/11/2022 29  % Final   Lymphs Abs 01/11/2022 1.7  0.7 - 4.0 K/uL  Final   Monocytes Relative 01/11/2022 14  % Final   Monocytes Absolute 01/11/2022 0.8  0.1 - 1.0 K/uL Final   Eosinophils Relative 01/11/2022 1  % Final   Eosinophils Absolute 01/11/2022 0.1  0.0 - 0.5 K/uL Final   Basophils Relative 01/11/2022 2  % Final   Basophils Absolute 01/11/2022 0.1  0.0 - 0.1 K/uL Final   Immature Granulocytes 01/11/2022 0  % Final   Abs Immature Granulocytes 01/11/2022 0.02  0.00 - 0.07 K/uL Final   Performed at [facility], [place] [address], [place], [place] [address]   Sodium 01/11/2022 136  135 - 145 mmol/L Final   Potassium 01/11/2022 3.7  3.5 - 5.1 mmol/L Final   Chloride 01/11/2022 101  98 - 111 mmol/L Final   CO2 01/11/2022 21 (L)  22 - 32 mmol/L Final   Glucose, Bld 01/11/2022 106 (H)  70 - 99 mg/dL Final   Glucose reference range applies only to samples taken after fasting for at least 8 hours.   BUN 01/11/2022 5 (L)  6 - 20 mg/dL Final   Creatinine, Ser 01/11/2022 0.66  0.61 - 1.24 mg/dL Final   Calcium 01/11/2022 8.5 (L)  8.9 - 10.3 mg/dL Final   Total Protein 01/11/2022 8.0  6.5 - 8.1 g/dL Final   Albumin 01/11/2022 4.1  3.5 - 5.0 g/dL Final   AST 01/11/2022 149 (H)  15 - 41 U/L Final   ALT 01/11/2022 63 (H)  0 - 44 U/L Final   Alkaline Phosphatase 01/11/2022 131 (H)  38 - 126 U/L Final   Total Bilirubin 01/11/2022 0.7  0.3 - 1.2 mg/dL Final   GFR, Estimated 01/11/2022 >60  >60 mL/min Final   Comment: (NOTE) Calculated using the CKD-EPI Creatinine Equation (2021)    Anion gap 01/11/2022 14  5 - 15 Final   Performed at [facility] [address], [place], [place] [address]   Alcohol, Ethyl (B) 01/11/2022 351 (HH)  <10 mg/dL Final   Comment: CRITICAL RESULT CALLED TO, READ BACK BY AND VERIFIED WITH B. SANGALANG RN, 0014, 01/12/22, EADEDOKUN (NOTE) Lowest detectable limit for serum alcohol is 10 mg/dL.  For medical purposes only. Performed at [facility], [place] [address] [place], [place] [address]   Admission on  12/08/2021, Discharged on 12/09/2021  Component Date Value Ref Range Status   Sodium 12/08/2021 140  135 - 145 mmol/L Final   Potassium 12/08/2021 3.8  3.5 - 5.1 mmol/L Final   Chloride 12/08/2021 103  98 - 111 mmol/L Final   CO2 12/08/2021 24  22 -  32 mmol/L Final   Glucose, Bld 12/08/2021 93  70 - 99 mg/dL Final   Glucose reference range applies only to samples taken after fasting for at least 8 hours.   BUN 12/08/2021 5 (L)  6 - 20 mg/dL Final   Creatinine, Ser 12/08/2021 0.66  0.61 - 1.24 mg/dL Final   Calcium 12/08/2021 8.7 (L)  8.9 - 10.3 mg/dL Final   Total Protein 12/08/2021 8.0  6.5 - 8.1 g/dL Final   Albumin 12/08/2021 4.2  3.5 - 5.0 g/dL Final   AST 12/08/2021 176 (H)  15 - 41 U/L Final   ALT 12/08/2021 75 (H)  0 - 44 U/L Final   Alkaline Phosphatase 12/08/2021 152 (H)  38 - 126 U/L Final   Total Bilirubin 12/08/2021 0.7  0.3 - 1.2 mg/dL Final   GFR, Estimated 12/08/2021 >60  >60 mL/min Final   Comment: (NOTE) Calculated using the CKD-EPI Creatinine Equation (2021)    Anion gap 12/08/2021 13  5 - 15 Final   Performed at [facility], [place] [address] [place], [place] [address]   Alcohol, Ethyl (B) 12/08/2021 372 (HH)  <10 mg/dL Final   Comment: CRITICAL RESULT CALLED TO, READ BACK BY AND VERIFIED WITH RIVERS,C AT 2216 ON 12/08/21 BY [email or web address] (NOTE) Lowest detectable limit for serum alcohol is 10 mg/dL.  For medical purposes only. Performed at [facility], [place] [address], [place], [place] [address]    Salicylate Lvl 43/32/9518 <7.0 (L)  7.0 - 30.0 mg/dL Final   Performed at [facility] [address], [place], [place] [address]   Acetaminophen (Tylenol), Serum 12/08/2021 <10 (L)  10 - 30 ug/mL Final   Comment: (NOTE) Therapeutic concentrations vary significantly. A range of 10-30 ug/mL  may be an effective concentration for many patients. However, some  are best treated at concentrations outside of this  range. Acetaminophen concentrations >150 ug/mL at 4 hours after ingestion  and >50 ug/mL at 12 hours after ingestion are often associated with  toxic reactions.  Performed at [facility], [place] [address] [place], [place] [address]    WBC 12/08/2021 4.0  4.0 - 10.5 K/uL Final   RBC 12/08/2021 4.26  4.22 - 5.81 MIL/uL Final   Hemoglobin 12/08/2021 11.6 (L)  13.0 - 17.0 g/dL Final   HCT 12/08/2021 36.1 (L)  39.0 - 52.0 % Final   MCV 12/08/2021 84.7  80.0 - 100.0 fL Final   MCH 12/08/2021 27.2  26.0 - 34.0 pg Final   MCHC 12/08/2021 32.1  30.0 - 36.0 g/dL Final   RDW 12/08/2021 17.2 (H)  11.5 - 15.5 % Final   Platelets 12/08/2021 120 (L)  150 - 400 K/uL Final   nRBC 12/08/2021 0.0  0.0 - 0.2 % Final   Performed at [facility], [place] [address], [place], [place] [address]   Opiates 12/08/2021 NONE DETECTED  NONE DETECTED Final   Cocaine 12/08/2021 NONE DETECTED  NONE DETECTED Final   Benzodiazepines 12/08/2021 NONE DETECTED  NONE DETECTED Final   Amphetamines 12/08/2021 NONE DETECTED  NONE DETECTED Final   Tetrahydrocannabinol 12/08/2021 NONE DETECTED  NONE DETECTED Final   Barbiturates 12/08/2021 NONE DETECTED  NONE DETECTED Final   Comment: (NOTE) DRUG SCREEN FOR MEDICAL PURPOSES ONLY.  IF CONFIRMATION IS NEEDED FOR ANY PURPOSE, NOTIFY LAB WITHIN 5 DAYS.  LOWEST DETECTABLE LIMITS FOR URINE DRUG SCREEN Drug Class  Cutoff (ng/mL) Amphetamine and metabolites    1000 Barbiturate and metabolites    200 Benzodiazepine                 570 Tricyclics and metabolites     300 Opiates and metabolites        300 Cocaine and metabolites        300 THC                            50 Performed at [facility], [place] [address] [place], [place] [address]   Admission on 12/06/2021, Discharged on 12/07/2021  Component Date Value Ref Range Status   Sodium 12/06/2021 138  135 - 145 mmol/L Final   Potassium 12/06/2021  3.6  3.5 - 5.1 mmol/L Final   Chloride 12/06/2021 103  98 - 111 mmol/L Final   CO2 12/06/2021 26  22 - 32 mmol/L Final   Glucose, Bld 12/06/2021 94  70 - 99 mg/dL Final   Glucose reference range applies only to samples taken after fasting for at least 8 hours.   BUN 12/06/2021 <5 (L)  6 - 20 mg/dL Final   Creatinine, Ser 12/06/2021 0.77  0.61 - 1.24 mg/dL Final   Calcium 12/06/2021 9.0  8.9 - 10.3 mg/dL Final   Total Protein 12/06/2021 8.2 (H)  6.5 - 8.1 g/dL Final   Albumin 12/06/2021 4.1  3.5 - 5.0 g/dL Final   AST 12/06/2021 149 (H)  15 - 41 U/L Final   ALT 12/06/2021 66 (H)  0 - 44 U/L Final   Alkaline Phosphatase 12/06/2021 147 (H)  38 - 126 U/L Final   Total Bilirubin 12/06/2021 0.6  0.3 - 1.2 mg/dL Final   GFR, Estimated 12/06/2021 >60  >60 mL/min Final   Comment: (NOTE) Calculated using the CKD-EPI Creatinine Equation (2021)    Anion gap 12/06/2021 9  5 - 15 Final   Performed at [facility], [place] [address] [place], [place] [address]   Alcohol, Ethyl (B) 12/06/2021 380 (HH)  <10 mg/dL Final   Comment: CRITICAL RESULT CALLED TO, READ BACK BY AND VERIFIED WITH GESELL, K @ 2003 [id number] JMK (NOTE) Lowest detectable limit for serum alcohol is 10 mg/dL.  For medical purposes only. Performed at [facility], [place] [address] [place], [place] [address]    WBC 12/06/2021 4.8  4.0 - 10.5 K/uL Final   RBC 12/06/2021 4.41  4.22 - 5.81 MIL/uL Final   Hemoglobin 12/06/2021 12.1 (L)  13.0 - 17.0 g/dL Final   HCT 12/06/2021 37.9 (L)  39.0 - 52.0 % Final   MCV 12/06/2021 85.9  80.0 - 100.0 fL Final   MCH 12/06/2021 27.4  26.0 - 34.0 pg Final   MCHC 12/06/2021 31.9  30.0 - 36.0 g/dL Final   RDW 12/06/2021 17.2 (H)  11.5 - 15.5 % Final   Platelets 12/06/2021 114 (L)  150 - 400 K/uL Final   nRBC 12/06/2021 0.0  0.0 - 0.2 % Final   Performed at [facility], [place] [address], [place], [place] [address]   Magnesium 12/06/2021 2.2  1.7 -  2.4 mg/dL Final   Performed at [facility] [address], [place], [place] [address]  Admission on 11/18/2021, Discharged on 11/18/2021  Component Date Value Ref Range Status   WBC 11/18/2021 4.5  4.0 - 10.5 K/uL Final   RBC 11/18/2021 4.24  4.22 -  5.81 MIL/uL Final   Hemoglobin 11/18/2021 11.7 (L)  13.0 - 17.0 g/dL Final   HCT 11/18/2021 36.1 (L)  39.0 - 52.0 % Final   MCV 11/18/2021 85.1  80.0 - 100.0 fL Final   MCH 11/18/2021 27.6  26.0 - 34.0 pg Final   MCHC 11/18/2021 32.4  30.0 - 36.0 g/dL Final   RDW 11/18/2021 16.4 (H)  11.5 - 15.5 % Final   Platelets 11/18/2021 107 (L)  150 - 400 K/uL Final   nRBC 11/18/2021 0.0  0.0 - 0.2 % Final   Neutrophils Relative % 11/18/2021 43  % Final   Neutro Abs 11/18/2021 2.0  1.7 - 7.7 K/uL Final   Lymphocytes Relative 11/18/2021 40  % Final   Lymphs Abs 11/18/2021 1.8  0.7 - 4.0 K/uL Final   Monocytes Relative 11/18/2021 12  % Final   Monocytes Absolute 11/18/2021 0.5  0.1 - 1.0 K/uL Final   Eosinophils Relative 11/18/2021 3  % Final   Eosinophils Absolute 11/18/2021 0.1  0.0 - 0.5 K/uL Final   Basophils Relative 11/18/2021 2  % Final   Basophils Absolute 11/18/2021 0.1  0.0 - 0.1 K/uL Final   Immature Granulocytes 11/18/2021 0  % Final   Abs Immature Granulocytes 11/18/2021 0.01  0.00 - 0.07 K/uL Final   Performed at [facility], [place] [address] [place], [place] [address]   Sodium 11/18/2021 140  135 - 145 mmol/L Final   Potassium 11/18/2021 3.5  3.5 - 5.1 mmol/L Final   Chloride 11/18/2021 108  98 - 111 mmol/L Final   CO2 11/18/2021 21 (L)  22 - 32 mmol/L Final   Glucose, Bld 11/18/2021 90  70 - 99 mg/dL Final   Glucose reference range applies only to samples taken after fasting for at least 8 hours.   BUN 11/18/2021 <5 (L)  6 - 20 mg/dL Final   Creatinine, Ser 11/18/2021 0.71  0.61 - 1.24 mg/dL Final   Calcium 11/18/2021 8.4 (L)  8.9 - 10.3 mg/dL Final   Total Protein 11/18/2021 7.4  6.5 - 8.1 g/dL Final    Albumin 11/18/2021 3.7  3.5 - 5.0 g/dL Final   AST 11/18/2021 91 (H)  15 - 41 U/L Final   ALT 11/18/2021 48 (H)  0 - 44 U/L Final   Alkaline Phosphatase 11/18/2021 178 (H)  38 - 126 U/L Final   Total Bilirubin 11/18/2021 0.5  0.3 - 1.2 mg/dL Final   GFR, Estimated 11/18/2021 >60  >60 mL/min Final   Comment: (NOTE) Calculated using the CKD-EPI Creatinine Equation (2021)    Anion gap 11/18/2021 11  5 - 15 Final   Performed at [facility], [place] [address], [place], [place] [address]   Alcohol, Ethyl (B) 11/18/2021 336 (HH)  <10 mg/dL Final   Comment: CRITICAL RESULT CALLED TO, READ BACK BY AND VERIFIED WITH DESMOND JAY,RN AT 1455 11/18/2021 BY ZBEECH. (NOTE) Lowest detectable limit for serum alcohol is 10 mg/dL.  For medical purposes only. Performed at [facility], [place] [address], [place], [place] [address]    Glucose-Capillary 11/18/2021 98  70 - 99 mg/dL Final   Glucose reference range applies only to samples taken after fasting for at least 8 hours.   Comment 1 11/18/2021 Notify RN   Final   Comment 2 11/18/2021 Document in Chart   Final  Admission on 11/16/2021, Discharged on 11/16/2021  Component Date Value Ref Range Status   WBC 11/16/2021 4.1  4.0 -  10.5 K/uL Final   RBC 11/16/2021 4.20 (L)  4.22 - 5.81 MIL/uL Final   Hemoglobin 11/16/2021 11.5 (L)  13.0 - 17.0 g/dL Final   HCT 11/16/2021 35.3 (L)  39.0 - 52.0 % Final   MCV 11/16/2021 84.0  80.0 - 100.0 fL Final   MCH 11/16/2021 27.4  26.0 - 34.0 pg Final   MCHC 11/16/2021 32.6  30.0 - 36.0 g/dL Final   RDW 11/16/2021 16.3 (H)  11.5 - 15.5 % Final   Platelets 11/16/2021 127 (L)  150 - 400 K/uL Final   nRBC 11/16/2021 0.0  0.0 - 0.2 % Final   Neutrophils Relative % 11/16/2021 38  % Final   Neutro Abs 11/16/2021 1.5 (L)  1.7 - 7.7 K/uL Final   Lymphocytes Relative 11/16/2021 45  % Final   Lymphs Abs 11/16/2021 1.9  0.7 - 4.0 K/uL Final   Monocytes Relative 11/16/2021 12  % Final   Monocytes Absolute 11/16/2021  0.5  0.1 - 1.0 K/uL Final   Eosinophils Relative 11/16/2021 3  % Final   Eosinophils Absolute 11/16/2021 0.1  0.0 - 0.5 K/uL Final   Basophils Relative 11/16/2021 2  % Final   Basophils Absolute 11/16/2021 0.1  0.0 - 0.1 K/uL Final   Immature Granulocytes 11/16/2021 0  % Final   Abs Immature Granulocytes 11/16/2021 0.01  0.00 - 0.07 K/uL Final   Performed at [facility], [place] [address], [place], [place] [address]   Sodium 11/16/2021 140  135 - 145 mmol/L Final   Potassium 11/16/2021 3.7  3.5 - 5.1 mmol/L Final   Chloride 11/16/2021 107  98 - 111 mmol/L Final   CO2 11/16/2021 22  22 - 32 mmol/L Final   Glucose, Bld 11/16/2021 93  70 - 99 mg/dL Final   Glucose reference range applies only to samples taken after fasting for at least 8 hours.   BUN 11/16/2021 <5 (L)  6 - 20 mg/dL Final   Creatinine, Ser 11/16/2021 0.77  0.61 - 1.24 mg/dL Final   Calcium 11/16/2021 8.8 (L)  8.9 - 10.3 mg/dL Final   Total Protein 11/16/2021 7.8  6.5 - 8.1 g/dL Final   Albumin 11/16/2021 4.0  3.5 - 5.0 g/dL Final   AST 11/16/2021 103 (H)  15 - 41 U/L Final   ALT 11/16/2021 52 (H)  0 - 44 U/L Final   Alkaline Phosphatase 11/16/2021 197 (H)  38 - 126 U/L Final   Total Bilirubin 11/16/2021 0.4  0.3 - 1.2 mg/dL Final   GFR, Estimated 11/16/2021 >60  >60 mL/min Final   Comment: (NOTE) Calculated using the CKD-EPI Creatinine Equation (2021)    Anion gap 11/16/2021 11  5 - 15 Final   Performed at [facility], [place] [address], [place], [place] [address]  Admission on 11/08/2021, Discharged on 11/09/2021  Component Date Value Ref Range Status   Glucose-Capillary 11/08/2021 90  70 - 99 mg/dL Final   Glucose reference range applies only to samples taken after fasting for at least 8 hours.  There may be more visits with results that are not included.    Allergies: Tomato and Aspirin  PTA Medications: (Not in a hospital admission)   Medical Decision Making  [employer] observation   Lab Orders          Resp Panel by RT-PCR (Flu A&B, Covid) Anterior Nasal Swab      Meds ordered this encounter  Medications   acetaminophen (TYLENOL) tablet 650 mg  alum & mag hydroxide-simeth (MAALOX/MYLANTA) 200-200-20 MG/5ML suspension 30 mL   magnesium hydroxide (MILK OF MAGNESIA) suspension 30 mL   thiamine (VITAMIN B1) injection 100 mg   thiamine (VITAMIN B1) tablet 100 mg   multivitamin with minerals tablet 1 tablet   LORazepam (ATIVAN) tablet 1 mg   hydrOXYzine (ATARAX) tablet 25 mg   loperamide (IMODIUM) capsule 2-4 mg   ondansetron (ZOFRAN-ODT) disintegrating tablet 4 mg   FOLLOWED BY Linked Order Group    LORazepam (ATIVAN) tablet 1 mg    LORazepam (ATIVAN) tablet 1 mg    LORazepam (ATIVAN) tablet 1 mg    LORazepam (ATIVAN) tablet 1 mg   gabapentin (NEURONTIN) capsule 300 mg   citalopram (CELEXA) tablet 10 mg     Recommendations  Based on my evaluation the patient appears to have an emergency medical condition for which I recommend the patient be transferred to the emergency department for further evaluation.  Evette Georges, NP 01/28/22  5:23 AM

## 2022-01-27 NOTE — ED Notes (Signed)
Elita Quick, RN, for update on pt. Writer was informed that COVID results were still pending. Gave RN a direct number to call once COVID result and when pt would be transferred to [facility]. RN stated she would follow up on transfer.

## 2022-01-27 NOTE — ED Notes (Addendum)
Patient moved with belongings to hall D.

## 2022-01-27 NOTE — Consult Note (Signed)
[facility] ED ASSESSMENT   Reason for Consult:  Psychiatry evaluation Referring Physician:  ER Physician Patient Identification: Barry Moore MRN:  [id number] ED Chief Complaint: Alcohol abuse with intoxication [facility])  Diagnosis:  Principal Problem:   Alcohol abuse with intoxication ([place]) Active Problems:   Major depressive disorder, recurrent severe without psychotic features [facility])   ED Assessment Time Calculation: Start Time: 1035 Stop Time: 1050 Total Time in Minutes (Assessment Completion): 15   Subjective:   Barry Moore is a 56 y.o. male patient admitted with Barry Moore is a 56 y.o. male patient admitted with significant hx of . hypertension seizures alcohol abuse alcoholic hepatitis, pancreatitis, depression, GI bleeding arrived at the ER with complaint of SI with no specific plan yesterday  after he was discharged from the ER by this provider.  Patient reports that he felt helpless and worthless and went back drinking.  He reports he drank 3-4 40 oz beer and made it back to the ER.  HPI:  Patient was seen awake, alert and was able to walk with steady gait to his stretcher assignment.  He barely made eye contact with this provider.  He reports that he does not care if he dies or lives and also he has a hidden plan of drinking himself to death he says.  He feels suicidal but no plan at this moment.  Again we discussed the consequences of his drinking and how it has affected his liver.  He is homeless and lost his Tent.  He reports that these situations makes him drink and feels suicidal.  He denies HI/AVH and no mention of paranoia.  We will send him to [facility] to continue detox treatment.  Past Psychiatric History: hx of alcohol abuse alcoholic hepatitis, pancreatitis, depression, Alcohol withdrawal seizures and multiple Suicide ideations.  He frequents the ER for care and has been to numerous Rehabilitation facilities but relapses after discharge.  His last inpatient Psych  hospitalization was April 2023 in [facility].  He has been off his Psychotropic medications for about three to four months he says.   He was discharged from the ER yesterday.  Risk to Self or Others: Is the patient at risk to self? Yes Has the patient been a risk to self in the past 6 months? Yes Has the patient been a risk to self within the distant past? Yes Is the patient a risk to others? No Has the patient been a risk to others in the past 6 months? No Has the patient been a risk to others within the distant past? No  [place] Scale:  [place] ED from 01/26/2022 in [place] DEPT ED from 01/24/2022 in [place] DEPT ED from 01/22/2022 in [place] High Risk High Risk High Risk       AIMS:  , , ,  ,   ASAM:    Substance Abuse:     Past Medical History:  Past Medical History:  Diagnosis Date   Alcoholic hepatitis    Depression    ETOH abuse    Gastric bleed 08/2018   Hypertension    Pancreatitis    Seizures ([place])    Suicidal behavior     Past Surgical History:  Procedure Laterality Date   BIOPSY  08/05/2018   Procedure: BIOPSY;  Surgeon: Irving Copas., MD;  Location: [place];  Service: Gastroenterology;;   BIOPSY  08/07/2018   Procedure: BIOPSY;  Surgeon: Thornton Park, MD;  Location: [facility] ENDOSCOPY;  Service: Gastroenterology;;   BIOPSY  01/02/2019   Procedure: BIOPSY;  Surgeon: Gatha Mayer, MD;  Location: [facility] ENDOSCOPY;  Service: Endoscopy;;   COLONOSCOPY WITH PROPOFOL N/A 08/07/2018   Procedure: COLONOSCOPY WITH PROPOFOL;  Surgeon: Thornton Park, MD;  Location: [place];  Service: Gastroenterology;  Laterality: N/A;   ESOPHAGOGASTRODUODENOSCOPY N/A 01/02/2019   Procedure: ESOPHAGOGASTRODUODENOSCOPY (EGD);  Surgeon: Gatha Mayer, MD;  Location: [facility] ENDOSCOPY;  Service: Endoscopy;  Laterality: N/A;    ESOPHAGOGASTRODUODENOSCOPY N/A 07/03/2019   Procedure: ESOPHAGOGASTRODUODENOSCOPY (EGD);  Surgeon: Wonda Horner, MD;  Location: [facility] ENDOSCOPY;  Service: Endoscopy;  Laterality: N/A;   ESOPHAGOGASTRODUODENOSCOPY N/A 06/26/2020   Procedure: ESOPHAGOGASTRODUODENOSCOPY (EGD);  Surgeon: Arta Silence, MD;  Location: [facility] ENDOSCOPY;  Service: Endoscopy;  Laterality: N/A;   ESOPHAGOGASTRODUODENOSCOPY (EGD) WITH PROPOFOL N/A 11/02/2016   Procedure: ESOPHAGOGASTRODUODENOSCOPY (EGD) WITH PROPOFOL;  Surgeon: Carol Ada, MD;  Location: [facility] ENDOSCOPY;  Service: Endoscopy;  Laterality: N/A;   ESOPHAGOGASTRODUODENOSCOPY (EGD) WITH PROPOFOL N/A 08/05/2018   Procedure: ESOPHAGOGASTRODUODENOSCOPY (EGD) WITH PROPOFOL;  Surgeon: Rush Landmark Telford Nab., MD;  Location: [place];  Service: Gastroenterology;  Laterality: N/A;   ESOPHAGOGASTRODUODENOSCOPY (EGD) WITH PROPOFOL N/A 07/14/2019   Procedure: ESOPHAGOGASTRODUODENOSCOPY (EGD) WITH PROPOFOL;  Surgeon: Otis Brace, MD;  Location: [facility] ENDOSCOPY;  Service: Gastroenterology;  Laterality: N/A;   HERNIA REPAIR     LEG SURGERY     POLYPECTOMY  08/07/2018   Procedure: POLYPECTOMY;  Surgeon: Thornton Park, MD;  Location[facility] ENDOSCOPY;  Service: Gastroenterology;;   Family History:  Family History  Problem Relation Age of Onset   Diabetes Mother    Alcoholism Mother    Emphysema Father    Lung cancer Father    Alcoholism Father    Family Psychiatric  History: Denies, states he is not sure of hx  Social History:  Social History   Substance and Sexual Activity  Alcohol Use Yes   Comment: 5+ BEERS DAILY, Patient does not know how much he drank tonight     Social History   Substance and Sexual Activity  Drug Use Not Currently   Frequency: 3.0 times per week   Types: Cocaine    Social History   Socioeconomic History   Marital status: Divorced    Spouse name: Not on file   Number of children: Not on file   Years of education: Not on file    Highest education level: Not on file  Occupational History   Not on file  Tobacco Use   Smoking status: Every Day    Packs/day: 1.00    Types: Cigarettes   Smokeless tobacco: Never  Vaping Use   Vaping Use: Never used  Substance and Sexual Activity   Alcohol use: Yes    Comment: 5+ BEERS DAILY, Patient does not know how much he drank tonight   Drug use: Not Currently    Frequency: 3.0 times per week    Types: Cocaine   Sexual activity: Not Currently  Other Topics Concern   Not on file  Social History Narrative   Not on file   Social Determinants of Health   Financial Resource Strain: Not on file  Food Insecurity: Not on file  Transportation Needs: Not on file  Physical Activity: Not on file  Stress: Not on file  Social Connections: Not on file   Additional Social History:    Allergies:   Allergies  Allergen Reactions   Tomato Shortness Of Breath and Nausea And Vomiting  Aspirin Nausea And Vomiting    Labs:  Results for orders placed or performed during the hospital encounter of 01/26/22 (from the past 48 hour(s))  CBC with Differential     Status: Abnormal   Collection Time: 01/26/22  8:43 PM  Result Value Ref Range   WBC 5.8 4.0 - 10.5 K/uL   RBC 4.05 (L) 4.22 - 5.81 MIL/uL   Hemoglobin 10.9 (L) 13.0 - 17.0 g/dL   HCT 34.5 (L) 39.0 - 52.0 %   MCV 85.2 80.0 - 100.0 fL   MCH 26.9 26.0 - 34.0 pg   MCHC 31.6 30.0 - 36.0 g/dL   RDW 16.0 (H) 11.5 - 15.5 %   Platelets 192 150 - 400 K/uL   nRBC 0.0 0.0 - 0.2 %   Neutrophils Relative % 44 %   Neutro Abs 2.5 1.7 - 7.7 K/uL   Lymphocytes Relative 38 %   Lymphs Abs 2.2 0.7 - 4.0 K/uL   Monocytes Relative 15 %   Monocytes Absolute 0.9 0.1 - 1.0 K/uL   Eosinophils Relative 2 %   Eosinophils Absolute 0.1 0.0 - 0.5 K/uL   Basophils Relative 1 %   Basophils Absolute 0.1 0.0 - 0.1 K/uL   Immature Granulocytes 0 %   Abs Immature Granulocytes 0.02 0.00 - 0.07 K/uL    Comment: Performed at [facility], [place] [address], [place], [place] [address]  Comprehensive metabolic panel     Status: Abnormal   Collection Time: 01/26/22  8:43 PM  Result Value Ref Range   Sodium 136 135 - 145 mmol/L   Potassium 3.7 3.5 - 5.1 mmol/L   Chloride 105 98 - 111 mmol/L   CO2 21 (L) 22 - 32 mmol/L   Glucose, Bld 109 (H) 70 - 99 mg/dL    Comment: Glucose reference range applies only to samples taken after fasting for at least 8 hours.   BUN 9 6 - 20 mg/dL   Creatinine, Ser 0.59 (L) 0.61 - 1.24 mg/dL   Calcium 9.5 8.9 - 10.3 mg/dL   Total Protein 8.3 (H) 6.5 - 8.1 g/dL   Albumin 4.2 3.5 - 5.0 g/dL   AST 153 (H) 15 - 41 U/L   ALT 90 (H) 0 - 44 U/L   Alkaline Phosphatase 133 (H) 38 - 126 U/L   Total Bilirubin 0.5 0.3 - 1.2 mg/dL   GFR, Estimated >60 >60 mL/min    Comment: (NOTE) Calculated using the CKD-EPI Creatinine Equation (2021)    Anion gap 10 5 - 15    Comment: Performed at [facility], [place] [address] [place], [place] [address]  Magnesium     Status: Abnormal   Collection Time: 01/26/22  8:43 PM  Result Value Ref Range   Magnesium 2.5 (H) 1.7 - 2.4 mg/dL    Comment: Performed at [facility], [place] [address], [place], [place] [address]  Ethanol     Status: Abnormal   Collection Time: 01/26/22  8:43 PM  Result Value Ref Range   Alcohol, Ethyl (B) 336 (HH) <10 mg/dL    Comment: CRITICAL RESULT CALLED TO, READ BACK BY AND VERIFIED WITH ROHR,A AT 2128 ON 01/26/22 BY VAZQUEZJ (NOTE) Lowest detectable limit for serum alcohol is 10 mg/dL.  For medical purposes only. Performed at [facility], [place] [address], [place], [place] [address]     Current Facility-Administered Medications  Medication Dose Route Frequency Provider Last Rate Last Admin   citalopram (  CELEXA) tablet 10 mg  10 mg Oral Daily Mickie Hillier, PA-C       folic acid (FOLVITE) tablet 1 mg  1 mg Oral Daily Autry, Lauren E, PA-C       LORazepam (ATIVAN) tablet  1-4 mg  1-4 mg Oral Q1H PRN Mickie Hillier, PA-C   1 mg at 01/27/22 0413   Or   LORazepam (ATIVAN) injection 1-4 mg  1-4 mg Intravenous Q1H PRN Mickie Hillier, PA-C       multivitamin with minerals tablet 1 tablet  1 tablet Oral Daily Erskine Speed, Lauren E, PA-C       thiamine (VITAMIN B1) tablet 100 mg  100 mg Oral Daily Autry, Lauren E, PA-C       Or   thiamine (VITAMIN B1) injection 100 mg  100 mg Intravenous Daily Mickie Hillier, PA-C       Current Outpatient Medications  Medication Sig Dispense Refill   thiamine (VITAMIN B-1) 100 MG tablet Take 1 tablet (100 mg total) by mouth daily. 30 tablet 0   citalopram (CELEXA) 10 MG tablet Take 1 tablet (10 mg total) by mouth daily. (Patient not taking: Reported on 01/26/2022) 7 tablet 0   gabapentin (NEURONTIN) 300 MG capsule Take 1 capsule (300 mg total) by mouth 3 (three) times daily. (Patient not taking: Reported on 01/26/2022) 90 capsule 0   Multiple Vitamin (MULTIVITAMIN WITH MINERALS) TABS tablet Take 1 tablet by mouth daily. (Patient not taking: Reported on 01/26/2022) 30 tablet 0    Musculoskeletal: Strength & Muscle Tone: within normal limits Gait & Station: normal Patient leans: Front   Psychiatric Specialty Exam: Presentation  General Appearance:  Casual  Eye Contact: Fleeting  Speech: Clear and Coherent; Normal Rate  Speech Volume: Normal  Handedness: Right   Mood and Affect  Mood: Anxious; Depressed  Affect: Congruent; Depressed   Thought Process  Thought Processes: Coherent  Descriptions of Associations:Intact  Orientation:Full (Time, Place and Person)  Thought Content:Logical  History of Schizophrenia/Schizoaffective disorder:No  Duration of Psychotic Symptoms:N/A  Hallucinations:Hallucinations: None  Ideas of Reference:None  Suicidal Thoughts:Suicidal Thoughts: Yes, Passive SI Passive Intent and/or Plan: Without Plan  Homicidal Thoughts:Homicidal Thoughts: No   Sensorium   Memory: Immediate Good; Recent Good; Remote Good  Judgment: Impaired  Insight: Poor   Executive Functions  Concentration: Good  Attention Span: Good  Recall: [place] of Knowledge: Good  Language: Good   Psychomotor Activity  Psychomotor Activity: Psychomotor Activity: Tremor   Assets  Assets: Communication Skills    Sleep  Sleep: Sleep: Fair   Physical Exam: Physical Exam Vitals and nursing note reviewed.  Constitutional:      Appearance: Normal appearance.  HENT:     Head: Normocephalic.     Nose: Nose normal.  Cardiovascular:     Rate and Rhythm: Tachycardia present.  Pulmonary:     Effort: Pulmonary effort is normal.  Musculoskeletal:        General: Normal range of motion.     Cervical back: Normal range of motion.  Skin:    General: Skin is warm and dry.  Neurological:     Mental Status: He is alert and oriented to person, place, and time.    Review of Systems  Constitutional: Negative.   HENT: Negative.    Eyes: Negative.   Respiratory: Negative.    Cardiovascular: Negative.   Gastrointestinal: Negative.   Genitourinary: Negative.   Musculoskeletal: Negative.   Skin: Negative.   Neurological:  Previous hx Alcohol withdrawal seizures  Endo/Heme/Allergies: Negative.   Psychiatric/Behavioral:  Positive for depression. The patient is nervous/anxious.    Blood pressure 133/81, pulse (!) 106, temperature 97.8 F (36.6 C), temperature source Oral, resp. rate 18, height '5\' 8"'$  (1.727 m), weight 71.7 kg, SpO2 100 %. Body mass index is 24.02 kg/m.  Medical Decision Making: Patient came back last night intoxicated with alcohol level 336 after he was discharged earlier yesterday.  He also endorses suicide with no plan than to drink himself to death one day.  He will be sent to [facility] to complete detox treatment.  Patient is alert and oriented x 4 and ambulates without issues.   Problem 1: Alcohol abuse with  intoxication  Problem 2: Recurrent MDD, Moderate-severe without Psychotic features   Disposition:  ADmit to [facility] to continue detox treatment.  Delfin Gant, NP-PMHNP-BC 01/27/2022 10:51 AM

## 2022-01-27 NOTE — ED Notes (Signed)
Patient denies SI, HI and AVH - will continue to monitor for safety

## 2022-01-27 NOTE — ED Notes (Signed)
Pt has been seen and wand by security.   Pt has 1 bag of belongings.

## 2022-01-27 NOTE — ED Notes (Signed)
Pt care taken no complaints at this time. 

## 2022-01-27 NOTE — BH Assessment (Signed)
TTS attempted to see will follow up with Pt's nurse.

## 2022-01-28 DIAGNOSIS — F332 Major depressive disorder, recurrent severe without psychotic features: Secondary | ICD-10-CM | POA: Diagnosis not present

## 2022-01-28 DIAGNOSIS — F1010 Alcohol abuse, uncomplicated: Secondary | ICD-10-CM

## 2022-01-28 DIAGNOSIS — D509 Iron deficiency anemia, unspecified: Secondary | ICD-10-CM | POA: Diagnosis not present

## 2022-01-28 DIAGNOSIS — K709 Alcoholic liver disease, unspecified: Secondary | ICD-10-CM | POA: Diagnosis not present

## 2022-01-28 MED ORDER — GABAPENTIN 300 MG PO CAPS
300.0000 mg | ORAL_CAPSULE | Freq: Three times a day (TID) | ORAL | Status: DC
  Administered 2022-01-28 – 2022-02-04 (×22): 300 mg via ORAL
  Filled 2022-01-28 (×22): qty 1

## 2022-01-28 MED ORDER — CITALOPRAM HYDROBROMIDE 10 MG PO TABS
10.0000 mg | ORAL_TABLET | Freq: Every day | ORAL | Status: DC
  Administered 2022-01-28 – 2022-02-04 (×8): 10 mg via ORAL
  Filled 2022-01-28 (×8): qty 1

## 2022-01-28 NOTE — ED Notes (Signed)
Patient pleasant this morning, alert and oriented x4. Denies SI/HI/AVH. Patient will be transferring to [facility] today.

## 2022-01-28 NOTE — ED Notes (Signed)
Pt sleeping@this time. Breathing even and unlabored. Will continue to monitor for safety 

## 2022-01-28 NOTE — ED Notes (Signed)
Pt sitting in dining room watching TV. A&O x4, calm and cooperative. Denies current SI/HI/AVH. No signs of distress noted. Monitoring for safety. Snack and juice provided.

## 2022-01-28 NOTE — ED Notes (Signed)
Patient is in group with the [facility].

## 2022-01-28 NOTE — ED Notes (Signed)
Pt sitting in dayroom watching tv. No acute distress noted. Denies concerns at present. Will continue to monitor for safety.

## 2022-01-28 NOTE — ED Notes (Signed)
Patient resting at this time with no sxs of distress noted - will continue to monitor for safety

## 2022-01-28 NOTE — ED Notes (Signed)
Patient observed/assessed in bed/chair resting quietly appearing with no distress and verbalizing no complaints at this time. Will continue to monitor.

## 2022-01-28 NOTE — ED Notes (Signed)
Pt transferred from Obs unit to [facility] requesting ETOH detox and residential tx afterward completing detox.Pt denies SI/HI/AVH. Calm, cooperative throughout interview process. Skin assessment completed. Oriented to unit and tx agreement. Meal and drink offered. At [facility], pt denies withdrawal sx from ETOH. Eating lunch in cafeteria. Pt verbally contract for safety. Will monitor for safety.

## 2022-01-28 NOTE — Progress Notes (Signed)
SPIRITUALITY GROUP NOTE  Spirituality group facilitated by Simone Curia, MDiv, [place].  Group Description: Group focused on topic of hope. Patients participated in facilitated discussion around topic, connecting with one another around experiences and definitions for hope. Group members engaged with visual explorer photos, reflecting on what hope looks like for them today. Group engaged in discussion around how their definitions of hope are present today in hospital.  Modalities: Psycho-social ed, Adlerian, Narrative, MI  Patient Progress: Jamori was present throughout group.  Engaged freely in group discussion with chaplain and peer.

## 2022-01-28 NOTE — ED Provider Notes (Signed)
Facility Based Crisis Admission H&P  Date: 01/28/22 Patient Name: Barry Moore MRN: [id number] Chief Complaint: No chief complaint on file.    Diagnoses:  Final diagnoses:  Alcohol abuse  Homelessness  Depressed affect   HPI:  On reassessment, pt is a&ox3, in no acute distress, non-toxic appearing. He is calm, cooperative, pleasant. Pt reports euthymic mood. He denies SI/VI/HI, AVH, paranoia. There is no evidence he is responding to internal stimuli, agitation, aggression or distractibility. Pt reports he would like to detox from alcohol. He reports use of 4 beers/day. He reports his last use was Tuesday. At present, pt denies any withdrawal symptoms. Discussed w/ pt transfer to [facility] for substance use tx. Pt in agreement.   PHQ 2-9:  [place] ED from 01/19/2022 in [facility] ED from 08/07/2021 in [place] DEPT ED from 09/04/2020 in [place]  Thoughts that you would be better off dead, or of hurting yourself in some way Not at all More than half the days More than half the days  PHQ-9 Total Score '2 14 18       '$ Flowsheet Row ED from 01/26/2022 in [place] DEPT ED from 01/24/2022 in [place] DEPT ED from 01/22/2022 in [place] CATEGORY High Risk High Risk High Risk        Total Time spent with patient: 15 minutes  Musculoskeletal  Strength & Muscle Tone: within normal limits Gait & Station: normal Patient leans: N/A  Psychiatric Specialty Exam  Presentation General Appearance:  Appropriate for Environment  Eye Contact: Fair  Speech: Clear and Coherent; Normal Rate  Speech Volume: Normal  Handedness: Right   Mood and Affect  Mood: Euthymic  Affect: Blunt   Thought Process  Thought Processes: Coherent  Descriptions of  Associations:Intact  Orientation:Full (Time, Place and Person)  Thought Content:Logical  Diagnosis of Schizophrenia or Schizoaffective disorder in past: No   Hallucinations:Hallucinations: None  Ideas of Reference:None  Suicidal Thoughts:Suicidal Thoughts: No  Homicidal Thoughts:Homicidal Thoughts: No   Sensorium  Memory: Immediate Fair  Judgment: Intact  Insight: Fair   Materials engineer: Fair  Attention Span: Fair  Recall: [employer] of Knowledge: Fair  Language: Fair   Psychomotor Activity  Psychomotor Activity: Psychomotor Activity: Normal   Assets  Assets: Desire for Improvement; Communication Skills; Financial Resources/Insurance; Resilience   Sleep  Sleep: Sleep: Fair Number of Hours of Sleep: 6   Nutritional Assessment (For OBS and FBC admissions only) Has the patient had a weight loss or gain of 10 pounds or more in the last 3 months?: No Has the patient had a decrease in food intake/or appetite?: No Does the patient have dental problems?: No Does the patient have eating habits or behaviors that may be indicators of an eating disorder including binging or inducing vomiting?: No Has the patient recently lost weight without trying?: 0 Has the patient been eating poorly because of a decreased appetite?: 0 Malnutrition Screening Tool Score: 0    Physical Exam Constitutional:      General: He is not in acute distress.    Appearance: He is not ill-appearing, toxic-appearing or diaphoretic.  Eyes:     General: No scleral icterus. Cardiovascular:     Rate and Rhythm: Normal rate.  Pulmonary:     Effort: Pulmonary effort is normal. No respiratory distress.  Neurological:     Mental Status: He  is alert and oriented to person, place, and time.  Psychiatric:        Attention and Perception: Attention and perception normal.        Mood and Affect: Mood normal. Affect is blunt.        Speech: Speech normal.         Behavior: Behavior normal. Behavior is cooperative.        Thought Content: Thought content normal.        Cognition and Memory: Cognition and memory normal.    Review of Systems  Constitutional:  Negative for chills and fever.  Respiratory:  Negative for shortness of breath.   Cardiovascular:  Negative for chest pain and palpitations.  Gastrointestinal:  Negative for abdominal pain.  Neurological:  Negative for headaches.  Psychiatric/Behavioral:  Positive for substance abuse.     Blood pressure (!) 154/80, pulse 80, temperature 98 F (36.7 C), temperature source Oral, resp. rate 16, SpO2 99 %. There is no height or weight on file to calculate BMI.  Past Psychiatric History:   Is the patient at risk to self? No  Has the patient been a risk to self in the past 6 months? No .    Has the patient been a risk to self within the distant past? Yes   Is the patient a risk to others? No   Has the patient been a risk to others in the past 6 months? No   Has the patient been a risk to others within the distant past? No   Past Medical History:  Past Medical History:  Diagnosis Date   Alcoholic hepatitis    Depression    ETOH abuse    Gastric bleed 08/2018   Hypertension    Pancreatitis    Seizures ([place])    Suicidal behavior     Past Surgical History:  Procedure Laterality Date   BIOPSY  08/05/2018   Procedure: BIOPSY;  Surgeon: Irving Copas., MD;  Location: [facility] ENDOSCOPY;  Service: Gastroenterology;;   BIOPSY  08/07/2018   Procedure: BIOPSY;  Surgeon: Thornton Park, MD;  Location: [facility] ENDOSCOPY;  Service: Gastroenterology;;   BIOPSY  01/02/2019   Procedure: BIOPSY;  Surgeon: Gatha Mayer, MD;  Location: [facility] ENDOSCOPY;  Service: Endoscopy;;   COLONOSCOPY WITH PROPOFOL N/A 08/07/2018   Procedure: COLONOSCOPY WITH PROPOFOL;  Surgeon: Thornton Park, MD;  Location: [place];  Service: Gastroenterology;  Laterality: N/A;   ESOPHAGOGASTRODUODENOSCOPY N/A 01/02/2019    Procedure: ESOPHAGOGASTRODUODENOSCOPY (EGD);  Surgeon: Gatha Mayer, MD;  Location: [facility] ENDOSCOPY;  Service: Endoscopy;  Laterality: N/A;   ESOPHAGOGASTRODUODENOSCOPY N/A 07/03/2019   Procedure: ESOPHAGOGASTRODUODENOSCOPY (EGD);  Surgeon: Wonda Horner, MD;  Location: [facility] ENDOSCOPY;  Service: Endoscopy;  Laterality: N/A;   ESOPHAGOGASTRODUODENOSCOPY N/A 06/26/2020   Procedure: ESOPHAGOGASTRODUODENOSCOPY (EGD);  Surgeon: Arta Silence, MD;  Location: [facility] ENDOSCOPY;  Service: Endoscopy;  Laterality: N/A;   ESOPHAGOGASTRODUODENOSCOPY (EGD) WITH PROPOFOL N/A 11/02/2016   Procedure: ESOPHAGOGASTRODUODENOSCOPY (EGD) WITH PROPOFOL;  Surgeon: Carol Ada, MD;  Location: [facility] ENDOSCOPY;  Service: Endoscopy;  Laterality: N/A;   ESOPHAGOGASTRODUODENOSCOPY (EGD) WITH PROPOFOL N/A 08/05/2018   Procedure: ESOPHAGOGASTRODUODENOSCOPY (EGD) WITH PROPOFOL;  Surgeon: Rush Landmark Telford Nab., MD;  Location: [place];  Service: Gastroenterology;  Laterality: N/A;   ESOPHAGOGASTRODUODENOSCOPY (EGD) WITH PROPOFOL N/A 07/14/2019   Procedure: ESOPHAGOGASTRODUODENOSCOPY (EGD) WITH PROPOFOL;  Surgeon: Otis Brace, MD;  Location: [facility] ENDOSCOPY;  Service: Gastroenterology;  Laterality: N/A;   HERNIA REPAIR     LEG SURGERY     POLYPECTOMY  08/07/2018   Procedure: POLYPECTOMY;  Surgeon: Thornton Park, MD;  Location: [facility] ENDOSCOPY;  Service: Gastroenterology;;    Family History:  Family History  Problem Relation Age of Onset   Diabetes Mother    Alcoholism Mother    Emphysema Father    Lung cancer Father    Alcoholism Father     Social History:  Social History   Socioeconomic History   Marital status: Divorced    Spouse name: Not on file   Number of children: Not on file   Years of education: Not on file   Highest education level: Not on file  Occupational History   Not on file  Tobacco Use   Smoking status: Every Day    Packs/day: 1.00    Types: Cigarettes   Smokeless tobacco: Never  Vaping Use    Vaping Use: Never used  Substance and Sexual Activity   Alcohol use: Yes    Comment: 5+ BEERS DAILY, Patient does not know how much he drank tonight   Drug use: Not Currently    Frequency: 3.0 times per week    Types: Cocaine   Sexual activity: Not Currently  Other Topics Concern   Not on file  Social History Narrative   Not on file   Social Determinants of Health   Financial Resource Strain: Not on file  Food Insecurity: Not on file  Transportation Needs: Not on file  Physical Activity: Not on file  Stress: Not on file  Social Connections: Not on file  Intimate Partner Violence: Not on file    SDOH:  SDOH Screenings   Alcohol Screen: High Risk (07/16/2021)  Depression (PHQ2-9): Low Risk  (01/21/2022)  Recent Concern: Depression (PHQ2-9) - High Risk (01/20/2022)  Tobacco Use: High Risk (01/26/2022)    Last Labs:  Admission on 01/26/2022, Discharged on 01/27/2022  Component Date Value Ref Range Status   WBC 01/26/2022 5.8  4.0 - 10.5 K/uL Final   RBC 01/26/2022 4.05 (L)  4.22 - 5.81 MIL/uL Final   Hemoglobin 01/26/2022 10.9 (L)  13.0 - 17.0 g/dL Final   HCT 01/26/2022 34.5 (L)  39.0 - 52.0 % Final   MCV 01/26/2022 85.2  80.0 - 100.0 fL Final   MCH 01/26/2022 26.9  26.0 - 34.0 pg Final   MCHC 01/26/2022 31.6  30.0 - 36.0 g/dL Final   RDW 01/26/2022 16.0 (H)  11.5 - 15.5 % Final   Platelets 01/26/2022 192  150 - 400 K/uL Final   nRBC 01/26/2022 0.0  0.0 - 0.2 % Final   Neutrophils Relative % 01/26/2022 44  % Final   Neutro Abs 01/26/2022 2.5  1.7 - 7.7 K/uL Final   Lymphocytes Relative 01/26/2022 38  % Final   Lymphs Abs 01/26/2022 2.2  0.7 - 4.0 K/uL Final   Monocytes Relative 01/26/2022 15  % Final   Monocytes Absolute 01/26/2022 0.9  0.1 - 1.0 K/uL Final   Eosinophils Relative 01/26/2022 2  % Final   Eosinophils Absolute 01/26/2022 0.1  0.0 - 0.5 K/uL Final   Basophils Relative 01/26/2022 1  % Final   Basophils Absolute 01/26/2022 0.1  0.0 - 0.1 K/uL Final    Immature Granulocytes 01/26/2022 0  % Final   Abs Immature Granulocytes 01/26/2022 0.02  0.00 - 0.07 K/uL Final   Performed at [facility], [place] [address], [place], [place] [address]   Sodium 01/26/2022 136  135 - 145 mmol/L Final   Potassium 01/26/2022 3.7  3.5 - 5.1  mmol/L Final   Chloride 01/26/2022 105  98 - 111 mmol/L Final   CO2 01/26/2022 21 (L)  22 - 32 mmol/L Final   Glucose, Bld 01/26/2022 109 (H)  70 - 99 mg/dL Final   Glucose reference range applies only to samples taken after fasting for at least 8 hours.   BUN 01/26/2022 9  6 - 20 mg/dL Final   Creatinine, Ser 01/26/2022 0.59 (L)  0.61 - 1.24 mg/dL Final   Calcium 01/26/2022 9.5  8.9 - 10.3 mg/dL Final   Total Protein 01/26/2022 8.3 (H)  6.5 - 8.1 g/dL Final   Albumin 01/26/2022 4.2  3.5 - 5.0 g/dL Final   AST 01/26/2022 153 (H)  15 - 41 U/L Final   ALT 01/26/2022 90 (H)  0 - 44 U/L Final   Alkaline Phosphatase 01/26/2022 133 (H)  38 - 126 U/L Final   Total Bilirubin 01/26/2022 0.5  0.3 - 1.2 mg/dL Final   GFR, Estimated 01/26/2022 >60  >60 mL/min Final   Comment: (NOTE) Calculated using the CKD-EPI Creatinine Equation (2021)    Anion gap 01/26/2022 10  5 - 15 Final   Performed at [facility], [place] [address], [place], [place] [address]   Magnesium 01/26/2022 2.5 (H)  1.7 - 2.4 mg/dL Final   Performed at [place] [address], [place], [place] [address]   Alcohol, Ethyl (B) 01/26/2022 336 (HH)  <10 mg/dL Final   Comment: CRITICAL RESULT CALLED TO, READ BACK BY AND VERIFIED WITH ROHR,A AT 2128 ON 01/26/22 BY VAZQUEZJ (NOTE) Lowest detectable limit for serum alcohol is 10 mg/dL.  For medical purposes only. Performed at [facility], [place] [address], [place], [place] [address]    Opiates 01/27/2022 NONE DETECTED  NONE DETECTED Final   Cocaine 01/27/2022 NONE DETECTED  NONE DETECTED Final   Benzodiazepines 01/27/2022 POSITIVE (A)  NONE  DETECTED Final   Amphetamines 01/27/2022 NONE DETECTED  NONE DETECTED Final   Tetrahydrocannabinol 01/27/2022 NONE DETECTED  NONE DETECTED Final   Barbiturates 01/27/2022 NONE DETECTED  NONE DETECTED Final   Comment: (NOTE) DRUG SCREEN FOR MEDICAL PURPOSES ONLY.  IF CONFIRMATION IS NEEDED FOR ANY PURPOSE, NOTIFY LAB WITHIN 5 DAYS.  LOWEST DETECTABLE LIMITS FOR URINE DRUG SCREEN Drug Class                     Cutoff (ng/mL) Amphetamine and metabolites    1000 Barbiturate and metabolites    200 Benzodiazepine                 200 Opiates and metabolites        300 Cocaine and metabolites        300 THC                            50 Performed at [facility], [place] [address] [place], [place] [address]    Color, Urine 01/27/2022 YELLOW  YELLOW Final   APPearance 01/27/2022 CLEAR  CLEAR Final   Specific Gravity, Urine 01/27/2022 1.024  1.005 - 1.030 Final   pH 01/27/2022 5.0  5.0 - 8.0 Final   Glucose, UA 01/27/2022 NEGATIVE  NEGATIVE mg/dL Final   Hgb urine dipstick 01/27/2022 NEGATIVE  NEGATIVE Final   Bilirubin Urine 01/27/2022 NEGATIVE  NEGATIVE Final   Ketones, ur 01/27/2022 NEGATIVE  NEGATIVE mg/dL Final   Protein, ur 01/27/2022 NEGATIVE  NEGATIVE mg/dL Final  Nitrite 01/27/2022 NEGATIVE  NEGATIVE Final   Leukocytes,Ua 01/27/2022 TRACE (A)  NEGATIVE Final   RBC / HPF 01/27/2022 0-5  0 - 5 RBC/hpf Final   WBC, UA 01/27/2022 6-10  0 - 5 WBC/hpf Final   Bacteria, UA 01/27/2022 NONE SEEN  NONE SEEN Final   Squamous Epithelial / LPF 01/27/2022 0-5  0 - 5 Final   Mucus 01/27/2022 PRESENT   Final   Performed at [place] [address], [place], [place] [address]   SARS Coronavirus 2 by RT PCR 01/27/2022 NEGATIVE  NEGATIVE Final   Comment: (NOTE) SARS-CoV-2 target nucleic acids are NOT DETECTED.  The SARS-CoV-2 RNA is generally detectable in upper respiratory specimens during the acute phase of infection. The lowest concentration of  SARS-CoV-2 viral copies this assay can detect is 138 copies/mL. A negative result does not preclude SARS-Cov-2 infection and should not be used as the sole basis for treatment or other patient management decisions. A negative result may occur with  improper specimen collection/handling, submission of specimen other than nasopharyngeal swab, presence of viral mutation(s) within the areas targeted by this assay, and inadequate number of viral copies(<138 copies/mL). A negative result must be combined with clinical observations, patient history, and epidemiological information. The expected result is Negative.  Fact Sheet for Patients:  EntrepreneurPulse.com.au  Fact Sheet for Healthcare Providers:  IncredibleEmployment.be  This test is no                          t yet approved or cleared by the [place] FDA and  has been authorized for detection and/or diagnosis of SARS-CoV-2 by FDA under an Emergency Use Authorization (EUA). This EUA will remain  in effect (meaning this test can be used) for the duration of the COVID-19 declaration under Section 564(b)(1) of the Act, 21 U.S.C.section 360bbb-3(b)(1), unless the authorization is terminated  or revoked sooner.       Influenza A by PCR 01/27/2022 NEGATIVE  NEGATIVE Final   Influenza B by PCR 01/27/2022 NEGATIVE  NEGATIVE Final   Comment: (NOTE) The Xpert Xpress SARS-CoV-2/FLU/RSV plus assay is intended as an aid in the diagnosis of influenza from Nasopharyngeal swab specimens and should not be used as a sole basis for treatment. Nasal washings and aspirates are unacceptable for Xpert Xpress SARS-CoV-2/FLU/RSV testing.  Fact Sheet for Patients: EntrepreneurPulse.com.au  Fact Sheet for Healthcare Providers: IncredibleEmployment.be  This test is not yet approved or cleared by the [place] FDA and has been authorized for detection and/or diagnosis of  SARS-CoV-2 by FDA under an Emergency Use Authorization (EUA). This EUA will remain in effect (meaning this test can be used) for the duration of the COVID-19 declaration under Section 564(b)(1) of the Act, 21 U.S.C. section 360bbb-3(b)(1), unless the authorization is terminated or revoked.  Performed at [facility], [place] [address], [place], [place] [address]   Admission on 01/24/2022, Discharged on 01/26/2022  Component Date Value Ref Range Status   Sodium 01/24/2022 139  135 - 145 mmol/L Final   Potassium 01/24/2022 3.8  3.5 - 5.1 mmol/L Final   Chloride 01/24/2022 107  98 - 111 mmol/L Final   CO2 01/24/2022 24  22 - 32 mmol/L Final   Glucose, Bld 01/24/2022 95  70 - 99 mg/dL Final   Glucose reference range applies only to samples taken after fasting for at least 8 hours.   BUN 01/24/2022 10  6 - 20 mg/dL Final  Creatinine, Ser 01/24/2022 0.79  0.61 - 1.24 mg/dL Final   Calcium 01/24/2022 8.4 (L)  8.9 - 10.3 mg/dL Final   Total Protein 01/24/2022 7.5  6.5 - 8.1 g/dL Final   Albumin 01/24/2022 3.8  3.5 - 5.0 g/dL Final   AST 01/24/2022 127 (H)  15 - 41 U/L Final   ALT 01/24/2022 78 (H)  0 - 44 U/L Final   Alkaline Phosphatase 01/24/2022 142 (H)  38 - 126 U/L Final   Total Bilirubin 01/24/2022 0.4  0.3 - 1.2 mg/dL Final   GFR, Estimated 01/24/2022 >60  >60 mL/min Final   Comment: (NOTE) Calculated using the CKD-EPI Creatinine Equation (2021)    Anion gap 01/24/2022 8  5 - 15 Final   Performed at [facility], [place] [address], [place], [place] [address]   Alcohol, Ethyl (B) 01/24/2022 268 (H)  <10 mg/dL Final   Comment: (NOTE) Lowest detectable limit for serum alcohol is 10 mg/dL.  For medical purposes only. Performed at [facility] [place] [address], [place], [place] [address]    Opiates 01/25/2022 NONE DETECTED  NONE DETECTED Final   Cocaine 01/25/2022 NONE DETECTED  NONE DETECTED Final   Benzodiazepines 01/25/2022  NONE DETECTED  NONE DETECTED Final   Amphetamines 01/25/2022 NONE DETECTED  NONE DETECTED Final   Tetrahydrocannabinol 01/25/2022 NONE DETECTED  NONE DETECTED Final   Barbiturates 01/25/2022 NONE DETECTED  NONE DETECTED Final   Comment: (NOTE) DRUG SCREEN FOR MEDICAL PURPOSES ONLY.  IF CONFIRMATION IS NEEDED FOR ANY PURPOSE, NOTIFY LAB WITHIN 5 DAYS.  LOWEST DETECTABLE LIMITS FOR URINE DRUG SCREEN Drug Class                     Cutoff (ng/mL) Amphetamine and metabolites    1000 Barbiturate and metabolites    200 Benzodiazepine                 200 Opiates and metabolites        300 Cocaine and metabolites        300 THC                            50 Performed at [facility], [place] [address], [place], [place] [address]    WBC 01/24/2022 4.8  4.0 - 10.5 K/uL Final   RBC 01/24/2022 3.48 (L)  4.22 - 5.81 MIL/uL Final   Hemoglobin 01/24/2022 9.5 (L)  13.0 - 17.0 g/dL Final   HCT 01/24/2022 30.0 (L)  39.0 - 52.0 % Final   MCV 01/24/2022 86.2  80.0 - 100.0 fL Final   MCH 01/24/2022 27.3  26.0 - 34.0 pg Final   MCHC 01/24/2022 31.7  30.0 - 36.0 g/dL Final   RDW 01/24/2022 16.6 (H)  11.5 - 15.5 % Final   Platelets 01/24/2022 165  150 - 400 K/uL Final   nRBC 01/24/2022 0.0  0.0 - 0.2 % Final   Neutrophils Relative % 01/24/2022 33  % Final   Neutro Abs 01/24/2022 1.6 (L)  1.7 - 7.7 K/uL Final   Lymphocytes Relative 01/24/2022 47  % Final   Lymphs Abs 01/24/2022 2.3  0.7 - 4.0 K/uL Final   Monocytes Relative 01/24/2022 15  % Final   Monocytes Absolute 01/24/2022 0.7  0.1 - 1.0 K/uL Final   Eosinophils Relative 01/24/2022 3  % Final   Eosinophils Absolute 01/24/2022 0.2  0.0 - 0.5 K/uL Final  Basophils Relative 01/24/2022 2  % Final   Basophils Absolute 01/24/2022 0.1  0.0 - 0.1 K/uL Final   Immature Granulocytes 01/24/2022 0  % Final   Abs Immature Granulocytes 01/24/2022 0.00  0.00 - 0.07 K/uL Final   Performed at [facility], [place] [address], [place], [place] [address]   WBC 01/25/2022 4.6  4.0 - 10.5 K/uL Final   RBC 01/25/2022 4.00 (L)  4.22 - 5.81 MIL/uL Final   Hemoglobin 01/25/2022 10.6 (L)  13.0 - 17.0 g/dL Final   HCT 01/25/2022 34.3 (L)  39.0 - 52.0 % Final   MCV 01/25/2022 85.8  80.0 - 100.0 fL Final   MCH 01/25/2022 26.5  26.0 - 34.0 pg Final   MCHC 01/25/2022 30.9  30.0 - 36.0 g/dL Final   RDW 01/25/2022 16.5 (H)  11.5 - 15.5 % Final   Platelets 01/25/2022 199  150 - 400 K/uL Final   nRBC 01/25/2022 0.0  0.0 - 0.2 % Final   Performed at [facility], [place] [address] [place], [place] [address]  Admission on 01/22/2022, Discharged on 01/23/2022  Component Date Value Ref Range Status   Sodium 01/22/2022 139  135 - 145 mmol/L Final   Potassium 01/22/2022 4.2  3.5 - 5.1 mmol/L Final   Chloride 01/22/2022 102  98 - 111 mmol/L Final   CO2 01/22/2022 21 (L)  22 - 32 mmol/L Final   Glucose, Bld 01/22/2022 105 (H)  70 - 99 mg/dL Final   Glucose reference range applies only to samples taken after fasting for at least 8 hours.   BUN 01/22/2022 8  6 - 20 mg/dL Final   Creatinine, Ser 01/22/2022 0.72  0.61 - 1.24 mg/dL Final   Calcium 01/22/2022 9.7  8.9 - 10.3 mg/dL Final   Total Protein 01/22/2022 8.0  6.5 - 8.1 g/dL Final   Albumin 01/22/2022 4.1  3.5 - 5.0 g/dL Final   AST 01/22/2022 216 (H)  15 - 41 U/L Final   ALT 01/22/2022 115 (H)  0 - 44 U/L Final   Alkaline Phosphatase 01/22/2022 186 (H)  38 - 126 U/L Final   Total Bilirubin 01/22/2022 0.5  0.3 - 1.2 mg/dL Final   GFR, Estimated 01/22/2022 >60  >60 mL/min Final   Comment: (NOTE) Calculated using the CKD-EPI Creatinine Equation (2021)    Anion gap 01/22/2022 16 (H)  5 - 15 Final   Performed at [place] [address], [place], [place] [address]   WBC 01/22/2022 5.9  4.0 - 10.5 K/uL Final   RBC 01/22/2022 4.09 (L)  4.22 - 5.81 MIL/uL Final   Hemoglobin 01/22/2022 10.9 (L)  13.0 - 17.0 g/dL Final   HCT 01/22/2022 35.1 (L)  39.0 -  52.0 % Final   MCV 01/22/2022 85.8  80.0 - 100.0 fL Final   MCH 01/22/2022 26.7  26.0 - 34.0 pg Final   MCHC 01/22/2022 31.1  30.0 - 36.0 g/dL Final   RDW 01/22/2022 16.5 (H)  11.5 - 15.5 % Final   Platelets 01/22/2022 192  150 - 400 K/uL Final   nRBC 01/22/2022 0.0  0.0 - 0.2 % Final   Neutrophils Relative % 01/22/2022 43  % Final   Neutro Abs 01/22/2022 2.5  1.7 - 7.7 K/uL Final   Lymphocytes Relative 01/22/2022 38  % Final   Lymphs Abs 01/22/2022 2.2  0.7 - 4.0 K/uL Final   Monocytes Relative 01/22/2022 16  % Final   Monocytes  Absolute 01/22/2022 0.9  0.1 - 1.0 K/uL Final   Eosinophils Relative 01/22/2022 2  % Final   Eosinophils Absolute 01/22/2022 0.1  0.0 - 0.5 K/uL Final   Basophils Relative 01/22/2022 1  % Final   Basophils Absolute 01/22/2022 0.1  0.0 - 0.1 K/uL Final   Immature Granulocytes 01/22/2022 0  % Final   Abs Immature Granulocytes 01/22/2022 0.02  0.00 - 0.07 K/uL Final   Performed at [facility], [place] [address], [place], [place] [address]   Alcohol, Ethyl (B) 01/22/2022 340 (HH)  <10 mg/dL Final   Comment: CRITICAL RESULT CALLED TO, READ BACK BY AND VERIFIED WITH Assunta Found, RN, 2232 01/22/22, A. RAMSEY (NOTE) Lowest detectable limit for serum alcohol is 10 mg/dL.  For medical purposes only. Performed at [facility], [place] [address], [place], [place] [address]    Opiates 01/22/2022 NONE DETECTED  NONE DETECTED Final   Cocaine 01/22/2022 NONE DETECTED  NONE DETECTED Final   Benzodiazepines 01/22/2022 NONE DETECTED  NONE DETECTED Final   Amphetamines 01/22/2022 NONE DETECTED  NONE DETECTED Final   Tetrahydrocannabinol 01/22/2022 NONE DETECTED  NONE DETECTED Final   Barbiturates 01/22/2022 NONE DETECTED  NONE DETECTED Final   Comment: (NOTE) DRUG SCREEN FOR MEDICAL PURPOSES ONLY.  IF CONFIRMATION IS NEEDED FOR ANY PURPOSE, NOTIFY LAB WITHIN 5 DAYS.  LOWEST DETECTABLE LIMITS FOR URINE DRUG SCREEN Drug Class                     Cutoff  (ng/mL) Amphetamine and metabolites    1000 Barbiturate and metabolites    200 Benzodiazepine                 200 Opiates and metabolites        300 Cocaine and metabolites        300 THC                            50 Performed at [facility], [place] [address], [place], [place] [address]    Color, Urine 01/22/2022 COLORLESS (A)  YELLOW Final   APPearance 01/22/2022 CLEAR  CLEAR Final   Specific Gravity, Urine 01/22/2022 1.002 (L)  1.005 - 1.030 Final   pH 01/22/2022 5.0  5.0 - 8.0 Final   Glucose, UA 01/22/2022 NEGATIVE  NEGATIVE mg/dL Final   Hgb urine dipstick 01/22/2022 NEGATIVE  NEGATIVE Final   Bilirubin Urine 01/22/2022 NEGATIVE  NEGATIVE Final   Ketones, ur 01/22/2022 NEGATIVE  NEGATIVE mg/dL Final   Protein, ur 01/22/2022 NEGATIVE  NEGATIVE mg/dL Final   Nitrite 01/22/2022 NEGATIVE  NEGATIVE Final   Leukocytes,Ua 01/22/2022 NEGATIVE  NEGATIVE Final   Performed at [place] [address] [place], [place] [address]  Admission on 01/19/2022, Discharged on 01/21/2022  Component Date Value Ref Range Status   SARS Coronavirus 2 by RT PCR 01/19/2022 NEGATIVE  NEGATIVE Final   Comment: (NOTE) SARS-CoV-2 target nucleic acids are NOT DETECTED.  The SARS-CoV-2 RNA is generally detectable in upper respiratory specimens during the acute phase of infection. The lowest concentration of SARS-CoV-2 viral copies this assay can detect is 138 copies/mL. A negative result does not preclude SARS-Cov-2 infection and should not be used as the sole basis for treatment or other patient management decisions. A negative result may occur with  improper specimen collection/handling, submission of specimen other than nasopharyngeal swab, presence of viral mutation(s) within the areas  targeted by this assay, and inadequate number of viral copies(<138 copies/mL). A negative result must be combined with clinical observations, patient history, and epidemiological information. The  expected result is Negative.  Fact Sheet for Patients:  EntrepreneurPulse.com.au  Fact Sheet for Healthcare Providers:  IncredibleEmployment.be  This test is no                          t yet approved or cleared by the [place] FDA and  has been authorized for detection and/or diagnosis of SARS-CoV-2 by FDA under an Emergency Use Authorization (EUA). This EUA will remain  in effect (meaning this test can be used) for the duration of the COVID-19 declaration under Section 564(b)(1) of the Act, 21 U.S.C.section 360bbb-3(b)(1), unless the authorization is terminated  or revoked sooner.       Influenza A by PCR 01/19/2022 NEGATIVE  NEGATIVE Final   Influenza B by PCR 01/19/2022 NEGATIVE  NEGATIVE Final   Comment: (NOTE) The Xpert Xpress SARS-CoV-2/FLU/RSV plus assay is intended as an aid in the diagnosis of influenza from Nasopharyngeal swab specimens and should not be used as a sole basis for treatment. Nasal washings and aspirates are unacceptable for Xpert Xpress SARS-CoV-2/FLU/RSV testing.  Fact Sheet for Patients: EntrepreneurPulse.com.au  Fact Sheet for Healthcare Providers: IncredibleEmployment.be  This test is not yet approved or cleared by the [place] FDA and has been authorized for detection and/or diagnosis of SARS-CoV-2 by FDA under an Emergency Use Authorization (EUA). This EUA will remain in effect (meaning this test can be used) for the duration of the COVID-19 declaration under Section 564(b)(1) of the Act, 21 U.S.C. section 360bbb-3(b)(1), unless the authorization is terminated or revoked.  Performed at [facility], [place] [address] [place], [place] [address]    WBC 01/19/2022 4.8  4.0 - 10.5 K/uL Final   RBC 01/19/2022 3.95 (L)  4.22 - 5.81 MIL/uL Final   Hemoglobin 01/19/2022 10.8 (L)  13.0 - 17.0 g/dL Final   HCT 01/19/2022 33.2 (L)  39.0 - 52.0 % Final   MCV  01/19/2022 84.1  80.0 - 100.0 fL Final   MCH 01/19/2022 27.3  26.0 - 34.0 pg Final   MCHC 01/19/2022 32.5  30.0 - 36.0 g/dL Final   RDW 01/19/2022 16.7 (H)  11.5 - 15.5 % Final   Platelets 01/19/2022 161  150 - 400 K/uL Final   nRBC 01/19/2022 0.0  0.0 - 0.2 % Final   Neutrophils Relative % 01/19/2022 40  % Final   Neutro Abs 01/19/2022 2.0  1.7 - 7.7 K/uL Final   Lymphocytes Relative 01/19/2022 38  % Final   Lymphs Abs 01/19/2022 1.8  0.7 - 4.0 K/uL Final   Monocytes Relative 01/19/2022 17  % Final   Monocytes Absolute 01/19/2022 0.8  0.1 - 1.0 K/uL Final   Eosinophils Relative 01/19/2022 3  % Final   Eosinophils Absolute 01/19/2022 0.1  0.0 - 0.5 K/uL Final   Basophils Relative 01/19/2022 2  % Final   Basophils Absolute 01/19/2022 0.1  0.0 - 0.1 K/uL Final   Immature Granulocytes 01/19/2022 0  % Final   Abs Immature Granulocytes 01/19/2022 0.02  0.00 - 0.07 K/uL Final   Performed at [facility], [place] [address], [place], [place] [address]   Sodium 01/19/2022 138  135 - 145 mmol/L Final   Potassium 01/19/2022 3.8  3.5 - 5.1 mmol/L Final   Chloride 01/19/2022 102  98 - 111  mmol/L Final   CO2 01/19/2022 22  22 - 32 mmol/L Final   Glucose, Bld 01/19/2022 89  70 - 99 mg/dL Final   Glucose reference range applies only to samples taken after fasting for at least 8 hours.   BUN 01/19/2022 7  6 - 20 mg/dL Final   Creatinine, Ser 01/19/2022 0.64  0.61 - 1.24 mg/dL Final   Calcium 01/19/2022 9.2  8.9 - 10.3 mg/dL Final   Total Protein 01/19/2022 8.1  6.5 - 8.1 g/dL Final   Albumin 01/19/2022 4.0  3.5 - 5.0 g/dL Final   AST 01/19/2022 140 (H)  15 - 41 U/L Final   ALT 01/19/2022 68 (H)  0 - 44 U/L Final   Alkaline Phosphatase 01/19/2022 144 (H)  38 - 126 U/L Final   Total Bilirubin 01/19/2022 0.4  0.3 - 1.2 mg/dL Final   GFR, Estimated 01/19/2022 >60  >60 mL/min Final   Comment: (NOTE) Calculated using the CKD-EPI Creatinine Equation (2021)    Anion gap 01/19/2022 14  5 - 15 Final    Performed at [place] [address] [place], [place] [address]   Hgb A1c MFr Bld 01/19/2022 5.3  4.8 - 5.6 % Final   Comment: (NOTE) Pre diabetes:          5.7%-6.4%  Diabetes:              >6.4%  Glycemic control for   <7.0% adults with diabetes    Mean Plasma Glucose 01/19/2022 105.41  mg/dL Final   Performed at [facility], [place] [address], [place], [place] [address]   Alcohol, Ethyl (B) 01/19/2022 290 (H)  <10 mg/dL Final   Comment: (NOTE) Lowest detectable limit for serum alcohol is 10 mg/dL.  For medical purposes only. Performed at [facility], [place] [address] [place], [place] [address]    Cholesterol 01/19/2022 251 (H)  0 - 200 mg/dL Final   Triglycerides 01/19/2022 163 (H)  <150 mg/dL Final   HDL 01/19/2022 105  >40 mg/dL Final   Total CHOL/HDL Ratio 01/19/2022 2.4  RATIO Final   VLDL 01/19/2022 33  0 - 40 mg/dL Final   LDL Cholesterol 01/19/2022 113 (H)  0 - 99 mg/dL Final   Comment:        Total Cholesterol/HDL:CHD Risk Coronary Heart Disease Risk Table                     Men   Women  1/2 Average Risk   3.4   3.3  Average Risk       5.0   4.4  2 X Average Risk   9.6   7.1  3 X Average Risk  23.4   11.0        Use the calculated Patient Ratio above and the CHD Risk Table to determine the patient's CHD Risk.        ATP III CLASSIFICATION (LDL):  <100     mg/dL   Optimal  100-129  mg/dL   Near or Above                    Optimal  130-159  mg/dL   Borderline  160-189  mg/dL   High  >190     mg/dL   Very High Performed at [facility] [address], [place], [place] [address]    TSH 01/19/2022 0.415  0.350 - 4.500 uIU/mL Final  Comment: Performed by a 3rd Generation assay with a functional sensitivity of <=0.01 uIU/mL. Performed at [facility], [place] [address], [place], [place] [address]    POC Amphetamine UR 01/19/2022 None Detected  NONE DETECTED (Cut Off Level 1000 ng/mL) Final   POC Secobarbital (BAR) 01/19/2022  None Detected  NONE DETECTED (Cut Off Level 300 ng/mL) Final   POC Buprenorphine (BUP) 01/19/2022 None Detected  NONE DETECTED (Cut Off Level 10 ng/mL) Final   POC Oxazepam (BZO) 01/19/2022 None Detected  NONE DETECTED (Cut Off Level 300 ng/mL) Final   POC Cocaine UR 01/19/2022 None Detected  NONE DETECTED (Cut Off Level 300 ng/mL) Final   POC Methamphetamine UR 01/19/2022 None Detected  NONE DETECTED (Cut Off Level 1000 ng/mL) Final   POC Morphine 01/19/2022 None Detected  NONE DETECTED (Cut Off Level 300 ng/mL) Final   POC Methadone UR 01/19/2022 None Detected  NONE DETECTED (Cut Off Level 300 ng/mL) Final   POC Oxycodone UR 01/19/2022 None Detected  NONE DETECTED (Cut Off Level 100 ng/mL) Final   POC Marijuana UR 01/19/2022 None Detected  NONE DETECTED (Cut Off Level 50 ng/mL) Final   SARSCOV2ONAVIRUS 2 AG 01/19/2022 NEGATIVE  NEGATIVE Final   Comment: (NOTE) SARS-CoV-2 antigen NOT DETECTED.   Negative results are presumptive.  Negative results do not preclude SARS-CoV-2 infection and should not be used as the sole basis for treatment or other patient management decisions, including infection  control decisions, particularly in the presence of clinical signs and  symptoms consistent with COVID-19, or in those who have been in contact with the virus.  Negative results must be combined with clinical observations, patient history, and epidemiological information. The expected result is Negative.  Fact Sheet for Patients: HandmadeRecipes.com.cy  Fact Sheet for Healthcare Providers: FuneralLife.at  This test is not yet approved or cleared by the [place] FDA and  has been authorized for detection and/or diagnosis of SARS-CoV-2 by FDA under an Emergency Use Authorization (EUA).  This EUA will remain in effect (meaning this test can be used) for the duration of  the COV                          ID-19 declaration under Section 564(b)(1) of  the Act, 21 U.S.C. section 360bbb-3(b)(1), unless the authorization is terminated or revoked sooner.    Admission on 01/11/2022, Discharged on 01/12/2022  Component Date Value Ref Range Status   WBC 01/11/2022 6.0  4.0 - 10.5 K/uL Final   RBC 01/11/2022 3.96 (L)  4.22 - 5.81 MIL/uL Final   Hemoglobin 01/11/2022 11.1 (L)  13.0 - 17.0 g/dL Final   HCT 01/11/2022 33.1 (L)  39.0 - 52.0 % Final   MCV 01/11/2022 83.6  80.0 - 100.0 fL Final   MCH 01/11/2022 28.0  26.0 - 34.0 pg Final   MCHC 01/11/2022 33.5  30.0 - 36.0 g/dL Final   RDW 01/11/2022 17.0 (H)  11.5 - 15.5 % Final   Platelets 01/11/2022 115 (L)  150 - 400 K/uL Final   nRBC 01/11/2022 0.0  0.0 - 0.2 % Final   Neutrophils Relative % 01/11/2022 54  % Final   Neutro Abs 01/11/2022 3.3  1.7 - 7.7 K/uL Final   Lymphocytes Relative 01/11/2022 29  % Final   Lymphs Abs 01/11/2022 1.7  0.7 - 4.0 K/uL Final   Monocytes Relative 01/11/2022 14  % Final   Monocytes Absolute 01/11/2022 0.8  0.1 - 1.0 K/uL  Final   Eosinophils Relative 01/11/2022 1  % Final   Eosinophils Absolute 01/11/2022 0.1  0.0 - 0.5 K/uL Final   Basophils Relative 01/11/2022 2  % Final   Basophils Absolute 01/11/2022 0.1  0.0 - 0.1 K/uL Final   Immature Granulocytes 01/11/2022 0  % Final   Abs Immature Granulocytes 01/11/2022 0.02  0.00 - 0.07 K/uL Final   Performed at [facility], [place] [address], [place], [place] [address]   Sodium 01/11/2022 136  135 - 145 mmol/L Final   Potassium 01/11/2022 3.7  3.5 - 5.1 mmol/L Final   Chloride 01/11/2022 101  98 - 111 mmol/L Final   CO2 01/11/2022 21 (L)  22 - 32 mmol/L Final   Glucose, Bld 01/11/2022 106 (H)  70 - 99 mg/dL Final   Glucose reference range applies only to samples taken after fasting for at least 8 hours.   BUN 01/11/2022 5 (L)  6 - 20 mg/dL Final   Creatinine, Ser 01/11/2022 0.66  0.61 - 1.24 mg/dL Final   Calcium 01/11/2022 8.5 (L)  8.9 - 10.3 mg/dL Final   Total Protein 01/11/2022 8.0  6.5 - 8.1 g/dL  Final   Albumin 01/11/2022 4.1  3.5 - 5.0 g/dL Final   AST 01/11/2022 149 (H)  15 - 41 U/L Final   ALT 01/11/2022 63 (H)  0 - 44 U/L Final   Alkaline Phosphatase 01/11/2022 131 (H)  38 - 126 U/L Final   Total Bilirubin 01/11/2022 0.7  0.3 - 1.2 mg/dL Final   GFR, Estimated 01/11/2022 >60  >60 mL/min Final   Comment: (NOTE) Calculated using the CKD-EPI Creatinine Equation (2021)    Anion gap 01/11/2022 14  5 - 15 Final   Performed at [facility] [address], [place], [place] [address]   Alcohol, Ethyl (B) 01/11/2022 351 (HH)  <10 mg/dL Final   Comment: CRITICAL RESULT CALLED TO, READ BACK BY AND VERIFIED WITH B. SANGALANG RN, 0014, 01/12/22, [email or web address] (NOTE) Lowest detectable limit for serum alcohol is 10 mg/dL.  For medical purposes only. Performed at [facility], [place] [address], [place], [place] [address]   Admission on 12/08/2021, Discharged on 12/09/2021  Component Date Value Ref Range Status   Sodium 12/08/2021 140  135 - 145 mmol/L Final   Potassium 12/08/2021 3.8  3.5 - 5.1 mmol/L Final   Chloride 12/08/2021 103  98 - 111 mmol/L Final   CO2 12/08/2021 24  22 - 32 mmol/L Final   Glucose, Bld 12/08/2021 93  70 - 99 mg/dL Final   Glucose reference range applies only to samples taken after fasting for at least 8 hours.   BUN 12/08/2021 5 (L)  6 - 20 mg/dL Final   Creatinine, Ser 12/08/2021 0.66  0.61 - 1.24 mg/dL Final   Calcium 12/08/2021 8.7 (L)  8.9 - 10.3 mg/dL Final   Total Protein 12/08/2021 8.0  6.5 - 8.1 g/dL Final   Albumin 12/08/2021 4.2  3.5 - 5.0 g/dL Final   AST 12/08/2021 176 (H)  15 - 41 U/L Final   ALT 12/08/2021 75 (H)  0 - 44 U/L Final   Alkaline Phosphatase 12/08/2021 152 (H)  38 - 126 U/L Final   Total Bilirubin 12/08/2021 0.7  0.3 - 1.2 mg/dL Final   GFR, Estimated 12/08/2021 >60  >60 mL/min Final   Comment: (NOTE) Calculated using the CKD-EPI Creatinine Equation (2021)    Anion gap 12/08/2021 13  5 - 15 Final  Performed at [facility], [place] [address], [place], [place] [address]   Alcohol, Ethyl (B) 12/08/2021 372 (HH)  <10 mg/dL Final   Comment: CRITICAL RESULT CALLED TO, READ BACK BY AND VERIFIED WITH RIVERS,C AT 2216 ON 12/08/21 BY [email or web address] (NOTE) Lowest detectable limit for serum alcohol is 10 mg/dL.  For medical purposes only. Performed at [facility], [place] [address] [place], [place] [address]    Salicylate Lvl 09/81/1914 <7.0 (L)  7.0 - 30.0 mg/dL Final   Performed at [place] [address] [place], [place] [address]   Acetaminophen (Tylenol), Serum 12/08/2021 <10 (L)  10 - 30 ug/mL Final   Comment: (NOTE) Therapeutic concentrations vary significantly. A range of 10-30 ug/mL  may be an effective concentration for many patients. However, some  are best treated at concentrations outside of this range. Acetaminophen concentrations >150 ug/mL at 4 hours after ingestion  and >50 ug/mL at 12 hours after ingestion are often associated with  toxic reactions.  Performed at [facility], [place] [address] [place], [place] [address]    WBC 12/08/2021 4.0  4.0 - 10.5 K/uL Final   RBC 12/08/2021 4.26  4.22 - 5.81 MIL/uL Final   Hemoglobin 12/08/2021 11.6 (L)  13.0 - 17.0 g/dL Final   HCT 12/08/2021 36.1 (L)  39.0 - 52.0 % Final   MCV 12/08/2021 84.7  80.0 - 100.0 fL Final   MCH 12/08/2021 27.2  26.0 - 34.0 pg Final   MCHC 12/08/2021 32.1  30.0 - 36.0 g/dL Final   RDW 12/08/2021 17.2 (H)  11.5 - 15.5 % Final   Platelets 12/08/2021 120 (L)  150 - 400 K/uL Final   nRBC 12/08/2021 0.0  0.0 - 0.2 % Final   Performed at [facility], [place] [address], [place], [place] [address]   Opiates 12/08/2021 NONE DETECTED  NONE DETECTED Final   Cocaine 12/08/2021 NONE DETECTED  NONE DETECTED Final   Benzodiazepines 12/08/2021 NONE DETECTED  NONE DETECTED Final   Amphetamines 12/08/2021 NONE DETECTED  NONE DETECTED Final    Tetrahydrocannabinol 12/08/2021 NONE DETECTED  NONE DETECTED Final   Barbiturates 12/08/2021 NONE DETECTED  NONE DETECTED Final   Comment: (NOTE) DRUG SCREEN FOR MEDICAL PURPOSES ONLY.  IF CONFIRMATION IS NEEDED FOR ANY PURPOSE, NOTIFY LAB WITHIN 5 DAYS.  LOWEST DETECTABLE LIMITS FOR URINE DRUG SCREEN Drug Class                     Cutoff (ng/mL) Amphetamine and metabolites    1000 Barbiturate and metabolites    200 Benzodiazepine                 469 Tricyclics and metabolites     300 Opiates and metabolites        300 Cocaine and metabolites        300 THC                            50 Performed at [facility], [place] [address] [place], [place] [address]   Admission on 12/06/2021, Discharged on 12/07/2021  Component Date Value Ref Range Status   Sodium 12/06/2021 138  135 - 145 mmol/L Final   Potassium 12/06/2021 3.6  3.5 - 5.1 mmol/L Final   Chloride 12/06/2021 103  98 - 111 mmol/L Final   CO2 12/06/2021 26  22 - 32 mmol/L Final  Glucose, Bld 12/06/2021 94  70 - 99 mg/dL Final   Glucose reference range applies only to samples taken after fasting for at least 8 hours.   BUN 12/06/2021 <5 (L)  6 - 20 mg/dL Final   Creatinine, Ser 12/06/2021 0.77  0.61 - 1.24 mg/dL Final   Calcium 12/06/2021 9.0  8.9 - 10.3 mg/dL Final   Total Protein 12/06/2021 8.2 (H)  6.5 - 8.1 g/dL Final   Albumin 12/06/2021 4.1  3.5 - 5.0 g/dL Final   AST 12/06/2021 149 (H)  15 - 41 U/L Final   ALT 12/06/2021 66 (H)  0 - 44 U/L Final   Alkaline Phosphatase 12/06/2021 147 (H)  38 - 126 U/L Final   Total Bilirubin 12/06/2021 0.6  0.3 - 1.2 mg/dL Final   GFR, Estimated 12/06/2021 >60  >60 mL/min Final   Comment: (NOTE) Calculated using the CKD-EPI Creatinine Equation (2021)    Anion gap 12/06/2021 9  5 - 15 Final   Performed at [facility], [place] [address], [place], [place] [address]   Alcohol, Ethyl (B) 12/06/2021 380 (HH)  <10 mg/dL Final   Comment: CRITICAL  RESULT CALLED TO, READ BACK BY AND VERIFIED WITH GESELL, K @ 2003 [id number] (NOTE) Lowest detectable limit for serum alcohol is 10 mg/dL.  For medical purposes only. Performed at [facility], [place] [address], [place], [place] [address]    WBC 12/06/2021 4.8  4.0 - 10.5 K/uL Final   RBC 12/06/2021 4.41  4.22 - 5.81 MIL/uL Final   Hemoglobin 12/06/2021 12.1 (L)  13.0 - 17.0 g/dL Final   HCT 12/06/2021 37.9 (L)  39.0 - 52.0 % Final   MCV 12/06/2021 85.9  80.0 - 100.0 fL Final   MCH 12/06/2021 27.4  26.0 - 34.0 pg Final   MCHC 12/06/2021 31.9  30.0 - 36.0 g/dL Final   RDW 12/06/2021 17.2 (H)  11.5 - 15.5 % Final   Platelets 12/06/2021 114 (L)  150 - 400 K/uL Final   nRBC 12/06/2021 0.0  0.0 - 0.2 % Final   Performed at [facility], [place] [address] [place], [place] [address]   Magnesium 12/06/2021 2.2  1.7 - 2.4 mg/dL Final   Performed at [place] [address], [place], [place] [address]  Admission on 11/18/2021, Discharged on 11/18/2021  Component Date Value Ref Range Status   WBC 11/18/2021 4.5  4.0 - 10.5 K/uL Final   RBC 11/18/2021 4.24  4.22 - 5.81 MIL/uL Final   Hemoglobin 11/18/2021 11.7 (L)  13.0 - 17.0 g/dL Final   HCT 11/18/2021 36.1 (L)  39.0 - 52.0 % Final   MCV 11/18/2021 85.1  80.0 - 100.0 fL Final   MCH 11/18/2021 27.6  26.0 - 34.0 pg Final   MCHC 11/18/2021 32.4  30.0 - 36.0 g/dL Final   RDW 11/18/2021 16.4 (H)  11.5 - 15.5 % Final   Platelets 11/18/2021 107 (L)  150 - 400 K/uL Final   nRBC 11/18/2021 0.0  0.0 - 0.2 % Final   Neutrophils Relative % 11/18/2021 43  % Final   Neutro Abs 11/18/2021 2.0  1.7 - 7.7 K/uL Final   Lymphocytes Relative 11/18/2021 40  % Final   Lymphs Abs 11/18/2021 1.8  0.7 - 4.0 K/uL Final   Monocytes Relative 11/18/2021 12  % Final   Monocytes Absolute 11/18/2021 0.5  0.1 - 1.0 K/uL Final   Eosinophils Relative 11/18/2021 3  % Final  Eosinophils Absolute 11/18/2021 0.1  0.0  - 0.5 K/uL Final   Basophils Relative 11/18/2021 2  % Final   Basophils Absolute 11/18/2021 0.1  0.0 - 0.1 K/uL Final   Immature Granulocytes 11/18/2021 0  % Final   Abs Immature Granulocytes 11/18/2021 0.01  0.00 - 0.07 K/uL Final   Performed at [facility], [place] [address], [place], [place] [address]   Sodium 11/18/2021 140  135 - 145 mmol/L Final   Potassium 11/18/2021 3.5  3.5 - 5.1 mmol/L Final   Chloride 11/18/2021 108  98 - 111 mmol/L Final   CO2 11/18/2021 21 (L)  22 - 32 mmol/L Final   Glucose, Bld 11/18/2021 90  70 - 99 mg/dL Final   Glucose reference range applies only to samples taken after fasting for at least 8 hours.   BUN 11/18/2021 <5 (L)  6 - 20 mg/dL Final   Creatinine, Ser 11/18/2021 0.71  0.61 - 1.24 mg/dL Final   Calcium 11/18/2021 8.4 (L)  8.9 - 10.3 mg/dL Final   Total Protein 11/18/2021 7.4  6.5 - 8.1 g/dL Final   Albumin 11/18/2021 3.7  3.5 - 5.0 g/dL Final   AST 11/18/2021 91 (H)  15 - 41 U/L Final   ALT 11/18/2021 48 (H)  0 - 44 U/L Final   Alkaline Phosphatase 11/18/2021 178 (H)  38 - 126 U/L Final   Total Bilirubin 11/18/2021 0.5  0.3 - 1.2 mg/dL Final   GFR, Estimated 11/18/2021 >60  >60 mL/min Final   Comment: (NOTE) Calculated using the CKD-EPI Creatinine Equation (2021)    Anion gap 11/18/2021 11  5 - 15 Final   Performed at [facility], [place] [address] [place], [place] [address]   Alcohol, Ethyl (B) 11/18/2021 336 (HH)  <10 mg/dL Final   Comment: CRITICAL RESULT CALLED TO, READ BACK BY AND VERIFIED WITH DESMOND JAY,RN AT 1455 11/18/2021 BY ZBEECH. (NOTE) Lowest detectable limit for serum alcohol is 10 mg/dL.  For medical purposes only. Performed at [facility], [place] [address], [place], [place] [address]    Glucose-Capillary 11/18/2021 98  70 - 99 mg/dL Final   Glucose reference range applies only to samples taken after fasting for at least 8 hours.   Comment 1 11/18/2021 Notify RN   Final   Comment 2 11/18/2021 Document in  Chart   Final  Admission on 11/16/2021, Discharged on 11/16/2021  Component Date Value Ref Range Status   WBC 11/16/2021 4.1  4.0 - 10.5 K/uL Final   RBC 11/16/2021 4.20 (L)  4.22 - 5.81 MIL/uL Final   Hemoglobin 11/16/2021 11.5 (L)  13.0 - 17.0 g/dL Final   HCT 11/16/2021 35.3 (L)  39.0 - 52.0 % Final   MCV 11/16/2021 84.0  80.0 - 100.0 fL Final   MCH 11/16/2021 27.4  26.0 - 34.0 pg Final   MCHC 11/16/2021 32.6  30.0 - 36.0 g/dL Final   RDW 11/16/2021 16.3 (H)  11.5 - 15.5 % Final   Platelets 11/16/2021 127 (L)  150 - 400 K/uL Final   nRBC 11/16/2021 0.0  0.0 - 0.2 % Final   Neutrophils Relative % 11/16/2021 38  % Final   Neutro Abs 11/16/2021 1.5 (L)  1.7 - 7.7 K/uL Final   Lymphocytes Relative 11/16/2021 45  % Final   Lymphs Abs 11/16/2021 1.9  0.7 - 4.0 K/uL Final   Monocytes Relative 11/16/2021 12  % Final   Monocytes Absolute 11/16/2021 0.5  0.1 -  1.0 K/uL Final   Eosinophils Relative 11/16/2021 3  % Final   Eosinophils Absolute 11/16/2021 0.1  0.0 - 0.5 K/uL Final   Basophils Relative 11/16/2021 2  % Final   Basophils Absolute 11/16/2021 0.1  0.0 - 0.1 K/uL Final   Immature Granulocytes 11/16/2021 0  % Final   Abs Immature Granulocytes 11/16/2021 0.01  0.00 - 0.07 K/uL Final   Performed at [facility], [place] [address] [place], [place] [address]   Sodium 11/16/2021 140  135 - 145 mmol/L Final   Potassium 11/16/2021 3.7  3.5 - 5.1 mmol/L Final   Chloride 11/16/2021 107  98 - 111 mmol/L Final   CO2 11/16/2021 22  22 - 32 mmol/L Final   Glucose, Bld 11/16/2021 93  70 - 99 mg/dL Final   Glucose reference range applies only to samples taken after fasting for at least 8 hours.   BUN 11/16/2021 <5 (L)  6 - 20 mg/dL Final   Creatinine, Ser 11/16/2021 0.77  0.61 - 1.24 mg/dL Final   Calcium 11/16/2021 8.8 (L)  8.9 - 10.3 mg/dL Final   Total Protein 11/16/2021 7.8  6.5 - 8.1 g/dL Final   Albumin 11/16/2021 4.0  3.5 - 5.0 g/dL Final   AST 11/16/2021 103 (H)  15 - 41 U/L Final    ALT 11/16/2021 52 (H)  0 - 44 U/L Final   Alkaline Phosphatase 11/16/2021 197 (H)  38 - 126 U/L Final   Total Bilirubin 11/16/2021 0.4  0.3 - 1.2 mg/dL Final   GFR, Estimated 11/16/2021 >60  >60 mL/min Final   Comment: (NOTE) Calculated using the CKD-EPI Creatinine Equation (2021)    Anion gap 11/16/2021 11  5 - 15 Final   Performed at [facility], [place] [address], [place], [place] [address]  Admission on 11/08/2021, Discharged on 11/09/2021  Component Date Value Ref Range Status   Glucose-Capillary 11/08/2021 90  70 - 99 mg/dL Final   Glucose reference range applies only to samples taken after fasting for at least 8 hours.  There may be more visits with results that are not included.    Allergies: Tomato and Aspirin  PTA Medications: (Not in a hospital admission)   Long Term Goals: Improvement in symptoms so as ready for discharge  Short Term Goals: Patient will verbalize feelings in meetings with treatment team members., Patient will attend at least of 50% of the groups daily., Pt will complete the PHQ9 on admission, day 3 and discharge., and Patient will take medications as prescribed daily.  Medical Decision Making  Transfer to [facility]  Recommendations  Based on my evaluation the patient does not appear to have an emergency medical condition.  Tharon Aquas, NP 01/28/22  10:09 AM

## 2022-01-29 ENCOUNTER — Encounter (HOSPITAL_COMMUNITY): Payer: Self-pay

## 2022-01-29 DIAGNOSIS — F1010 Alcohol abuse, uncomplicated: Secondary | ICD-10-CM | POA: Diagnosis not present

## 2022-01-29 DIAGNOSIS — F332 Major depressive disorder, recurrent severe without psychotic features: Secondary | ICD-10-CM | POA: Diagnosis not present

## 2022-01-29 DIAGNOSIS — K709 Alcoholic liver disease, unspecified: Secondary | ICD-10-CM | POA: Diagnosis not present

## 2022-01-29 DIAGNOSIS — D509 Iron deficiency anemia, unspecified: Secondary | ICD-10-CM | POA: Diagnosis not present

## 2022-01-29 MED ORDER — MIRTAZAPINE 15 MG PO TABS
15.0000 mg | ORAL_TABLET | Freq: Every day | ORAL | Status: DC
  Administered 2022-01-29 – 2022-02-03 (×6): 15 mg via ORAL
  Filled 2022-01-29 (×6): qty 1

## 2022-01-29 NOTE — Progress Notes (Signed)
Patient is awake and alert on unit.  Presently sitting in dayroom watching movies and socializing with peer.  Patient participated in a recovery based discussion with RN and MHT.  Patient appears to be motivated for treatment.  He is calm and pleasant on approach and easily engaged.  Will monitor.

## 2022-01-29 NOTE — ED Notes (Signed)
Pt attended [place] meeting, coffee and snacks given

## 2022-01-29 NOTE — BH IP Treatment Plan (Signed)
Interdisciplinary Treatment and Diagnostic Plan Update  01/29/2022 Time of Session: 9:20AM Barry Moore MRN: [id number]  Diagnosis:  Final diagnoses:  Alcohol abuse  Homelessness  Depressed affect     Current Medications:  Current Facility-Administered Medications  Medication Dose Route Frequency Provider Last Rate Last Admin   acetaminophen (TYLENOL) tablet 650 mg  650 mg Oral Q6H PRN Evette Georges, NP       alum & mag hydroxide-simeth (MAALOX/MYLANTA) 200-200-20 MG/5ML suspension 30 mL  30 mL Oral Q4H PRN Evette Georges, NP       citalopram (CELEXA) tablet 10 mg  10 mg Oral Daily Evette Georges, NP   10 mg at 01/29/22 0902   gabapentin (NEURONTIN) capsule 300 mg  300 mg Oral TID Evette Georges, NP   300 mg at 01/29/22 3149   hydrOXYzine (ATARAX) tablet 25 mg  25 mg Oral Q6H PRN Evette Georges, NP       loperamide (IMODIUM) capsule 2-4 mg  2-4 mg Oral PRN Evette Georges, NP       LORazepam (ATIVAN) tablet 1 mg  1 mg Oral Q6H PRN Evette Georges, NP       magnesium hydroxide (MILK OF MAGNESIA) suspension 30 mL  30 mL Oral Daily PRN Evette Georges, NP       multivitamin with minerals tablet 1 tablet  1 tablet Oral Daily Evette Georges, NP   1 tablet at 01/29/22 0902   ondansetron (ZOFRAN-ODT) disintegrating tablet 4 mg  4 mg Oral Q6H PRN Evette Georges, NP       thiamine (VITAMIN B1) tablet 100 mg  100 mg Oral Daily Evette Georges, NP   100 mg at 01/29/22 0902   Current Outpatient Medications  Medication Sig Dispense Refill   citalopram (CELEXA) 10 MG tablet Take 1 tablet (10 mg total) by mouth daily. (Patient not taking: Reported on 01/26/2022) 7 tablet 0   gabapentin (NEURONTIN) 300 MG capsule Take 1 capsule (300 mg total) by mouth 3 (three) times daily. (Patient not taking: Reported on 01/26/2022) 90 capsule 0   Multiple Vitamin (MULTIVITAMIN WITH MINERALS) TABS tablet Take 1 tablet by mouth daily. (Patient not taking: Reported on 01/26/2022) 30 tablet 0   thiamine (VITAMIN B-1) 100 MG  tablet Take 1 tablet (100 mg total) by mouth daily. 30 tablet 0   PTA Medications: Prior to Admission medications   Medication Sig Start Date End Date Taking? Authorizing Provider  citalopram (CELEXA) 10 MG tablet Take 1 tablet (10 mg total) by mouth daily. Patient not taking: Reported on 01/26/2022 10/29/21   Vesta Mixer, NP  gabapentin (NEURONTIN) 300 MG capsule Take 1 capsule (300 mg total) by mouth 3 (three) times daily. Patient not taking: Reported on 01/26/2022 01/21/22 02/20/22  Merrily Brittle, DO  Multiple Vitamin (MULTIVITAMIN WITH MINERALS) TABS tablet Take 1 tablet by mouth daily. Patient not taking: Reported on 01/26/2022 01/21/22 02/20/22  Merrily Brittle, DO  thiamine (VITAMIN B-1) 100 MG tablet Take 1 tablet (100 mg total) by mouth daily. 01/21/22 02/20/22  Merrily Brittle, DO  ferrous sulfate 325 (65 FE) MG tablet Take 1 tablet (325 mg total) by mouth daily with breakfast. Patient not taking: Reported on 09/23/2018 08/09/18 10/09/18  Elodia Florence., MD  pantoprazole (PROTONIX) 40 MG tablet Take 1 tablet (40 mg total) by mouth 2 (two) times daily before a meal. Patient not taking: Reported on 08/14/2020 06/27/20 08/14/20  Charlynne Cousins, MD    Patient Stressors: Financial difficulties   Substance abuse  Other: homelessness    Patient Strengths: Ability for insight  Average or above average intelligence  Capable of independent living  Communication skills  General fund of knowledge   Treatment Modalities: Medication Management, Group therapy, Case management,  1 to 1 session with clinician, Psychoeducation, Recreational therapy.   Physician Treatment Plan for Primary and Secondary Diagnosis:  Final diagnoses:  Alcohol abuse  Homelessness  Depressed affect   Long Term Goal(s): Improvement in symptoms so as ready for discharge  Short Term Goals: Patient will verbalize feelings in meetings with treatment team members. Patient will attend at least of 50% of the groups  daily. Pt will complete the PHQ9 on admission, day 3 and discharge. Patient will take medications as prescribed daily.  Medication Management: Evaluate patient's response, side effects, and tolerance of medication regimen.  Therapeutic Interventions: 1 to 1 sessions, Unit Group sessions and Medication administration.  Evaluation of Outcomes: Progressing  LCSW Treatment Plan for Primary Diagnosis:  Final diagnoses:  Alcohol abuse  Homelessness  Depressed affect    Long Term Goal(s): Safe transition to appropriate next level of care at discharge.  Short Term Goals: Facilitate acceptance of mental health diagnosis and concerns through verbal commitment to aftercare plan and appointments at discharge., Patient will identify one social support prior to discharge to aid in patient's recovery., Patient will attend AA/NA groups as scheduled., Identify minimum of 2 triggers associated with mental health/substance abuse issues with treatment team members., and Increase skills for wellness and recovery by attending 50% of scheduled groups.  Therapeutic Interventions: Assess for all discharge needs, 1 to 1 time with Education officer, museum, Explore available resources and support systems, Assess for adequacy in community support network, Educate family and significant other(s) on suicide prevention, Complete Psychosocial Assessment, Interpersonal group therapy.  Evaluation of Outcomes: Progressing   Progress in Treatment: Attending groups: Yes. Participating in groups: Yes. Taking medication as prescribed: Yes. Toleration medication: Yes. Family/Significant other contact made: Yes, individual(s) contacted:  LCSW was provided permission to follow up with his sister [name] [phone], however received no answer. LCSW will continue to follow up to gain collateral.  Patient understands diagnosis: Yes. Discussing patient identified problems/goals with staff: Yes. Medical problems stabilized or  resolved: Yes. Denies suicidal/homicidal ideation: Yes. Issues/concerns per patient self-inventory: Yes. Other: homelessness; substance use  New problem(s) identified: No, Describe:  other than reported on admission.   New Short Term/Long Term Goal(s): Safe transition to appropriate next level of care at discharge, Engage patient in therapeutic group addressing interpersonal concerns. Engage patient in aftercare planning with referrals and resources, Increase ability to appropriately verbalize feelings, Facilitate acceptance of mental health diagnosis and concerns and Identify triggers associated with mental health/substance abuse issues.    Patient Goals:  Patient reports an interest in residential placement at this time.   Discharge Plan or Barriers: LCSW will send referrals out for residential placement at this time.   Reason for Continuation of Hospitalization: Withdrawal symptoms  Estimated Length of Stay: 3-5 days  Last 3 [place] Suicide Severity Risk Score: [facility] from 01/27/2022 in [facility] ED from 01/26/2022 in [place] [facility] ED from 01/24/2022 in [place] DEPT  C-SSRS RISK CATEGORY No Risk High Risk High Risk       Last PHQ 2/9 Scores:    01/28/2022    2:40 PM 01/21/2022    2:11 PM 01/20/2022    2:10 PM  Depression screen PHQ 2/9  Decreased Interest  $'3 2 3  'C$ Down, Depressed, Hopeless 3 0 3  PHQ - 2 Score '6 2 6  '$ Altered sleeping 2 0 3  Tired, decreased energy 2 0 3  Change in appetite 2 0 3  Feeling bad or failure about yourself  3 0 3  Trouble concentrating 3 0 3  Moving slowly or fidgety/restless 2 0 2  Suicidal thoughts 3 0 3  PHQ-9 Score '23 2 26  '$ Difficult doing work/chores Very difficult Somewhat difficult Very difficult    Scribe for Treatment Team: Gigi Gin 01/29/2022 10:29 AM

## 2022-01-29 NOTE — Tx Team (Signed)
LCSW met with patient to assess current mood, affect, physical state, and inquire about needs/goals while here in [facility] and after discharge. Patient reports he presented due to alcohol intoxication. Patient reports immediately going to drink after he discharged from the [facility] last week. Patient reports that he was not motivated to change at that time, so he didn't want remian at the hospital and waste time. Patient reports he knows that he needs to make a change at this time, after being told by the MD that he was going to kill himself due to his excessive drinking. Patient reports having thoughts of "being found dead", or feeling like there was no reason to live. However, reports no intent or plan to do anything to kill himself. Patient reports stressors of being homeless, not being able to shower and clean himself, and having to wear that same clothes over and over. Patient reports he wants to do better for himself, however reports due to circumstances he just does not see a way out. Patient reports his goal would be to work again. 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 Patient reported a year and a half of sobriety back in 1999-2000. Patient reports things hit rock bottom after his adoptive mother passed in 2013. Patient reports he thought buying a 5th of alcohol would not be an issue, however reports that turned into him purchasing a half gallon of alcohol and then it just spiraled downhill from there. Patient reports he has been living on the streets for 20+ years. Patient reports he came back to [place] about 3 months ago after a 9 month stay in [place] where his sister lives. Patient reports he was living in a camper on his sister's property, however reports having issues with his brother-in-law and was unable to return back to their property. Patient reports his sister purchased a bus ticket to get him  back to [facility]. Patient reports he has been living on the streets since. Patient reports being resourceful stating he gets daily meals at the [facility]. Patient reports at this time, he is interested in seeking residential placement for himself. LCSW provided brief counseling to the patient regarding his commitment to change and seek care for himself. Patient reports he was not committed before but reports he does not want to die so he needs help now. Patient provided LCSW with permission to reach out to his sister to gain collateral information. Contact information provided: Tishawn Friedhoff [phone]. No other needs were reported at this time.   LCSW attempted to get in contact with the patient's sister Choua Chalker [phone] to gain collateral, however received no answer. LCSW left non-emergency voice message at 10:24am requesting phone call back.     LCSW will continue to follow and provide support to patient while on [facility] unit.    Lucius Conn, LCSW Clinical Social Worker [facility] Ph: [phone]

## 2022-01-29 NOTE — Discharge Planning (Addendum)
The following facilities are barriers for the patient due to insurance and lack of funds:  [facility]- uninsured or medicaid only; Patient has [employer] Medicaid out of state. Will send referral for review.  [facility]: 2 year commitment required; Number was provided to the patient for follow up; Patient spoke with Admissions Coordinator Gerald Stabs regarding the program and services provided. Patient followed up with LCSW after phone screening and reported he does not believe he wants to commit to a 2 year program that will not allow him to have contact with anyone for 30-60 days; no visitations for the first 90 days; and patient was informed that he would have to cut his beard and hair. Patient reports hesitant regarding the program requirements, and states "I'm not going to say no right now, but would you please keep looking for me". LCSW informed patient that LCSW will follow up to provide updates as received.  [facility] [phone]: Left voice message at 1:01pm requesting phone call back regarding referral process.  [facility]: currently has a waiting list for admission. [facility]: No longer operating [facility]: $500 entry fee required for mens admission; 6-12 month program; does not accept any insurance;  [facility] [place]- Currently not operating, however current waitlist for admission; accepts no insurance and medicaid only  [facility]- 28 day program; however no beds available until end of November; Must be fully detoxed; tailored medicaid not standard or uninsured; [facility] spoke with Facility Representative who reports that the program is a private pay program. [phone] for their 28 day program. Only accept [employer].  [facility]: Per [place], they do not accept any form of medicare or medicaid.  [facility]: only accepts uninsured and in-state medicaid.  [facility]: does not accept [employer]: Only accepts the uninsured [facility]: Self pay program; Requires $465 to move in and then $145 a week. [facility]: only accepts the uninsured [employer]: Requires $750 entry fee  LCSW received phone call back from patient's sister Maximillion Gill [phone]. Per Sister, the patient has been struggling with alcoholism for a long time. Sister reports concerns regarding his safety towards himself and others. Sister reports the patient is in need of treatment, however reports it has been hard to get him to commit to treatment. Sister did confirm that the patient was residing in [place] on her land for a few months due to her having medical issues and wanting the patient to be near. Sister reports having rules at their home regarding no drinking, and reports the patient continuously broke the rules. Sister reports the patient is not welcomed back to her home, and reports she hopes the patient is serious about treatment at this time. Sister reports a desire to be informed of the patient's progress and discharge plan. Sister reports constant contact with patient, however she is unable to assist him at this time. Sister aware that LCSW is seeking residential placement for the patient. LCSW will provide update to sister if permission is provided by the patient. No other needs to report at this time.   Lucius Conn, LCSW Clinical Social Worker [facility] Ph: [phone]

## 2022-01-29 NOTE — Progress Notes (Signed)
Patient observed an [place] meeting on zoom.  Video was off and it was muted on our end to protect patients privacy.  Patient was attentive.

## 2022-01-29 NOTE — ED Notes (Signed)
Patient is awake and alert on unit.  He is calm and pleasant however quiet and keeps to himself.  Patient without withdrawal symptoms or distress.   He makes needs known to staff.  Will monitor and provide a safe environment.

## 2022-01-29 NOTE — ED Notes (Signed)
Patient resting with no sxs of distress noted - will continue to monitor for safety

## 2022-01-29 NOTE — ED Notes (Signed)
Patient denies SI, HI and AVH - attending AA meeting - will continue to monitor for safety

## 2022-01-29 NOTE — ED Provider Notes (Addendum)
Behavioral Health Progress Note  Date and Time: 01/29/2022 10:47 AM Name: Barry Moore MRN:  [id number]  Subjective:  Barry Moore was seen and evaluated face-to-face by this provider.  He continues to deny suicidal or homicidal ideations.  Denies auditory or visual hallucinations.  He reports " I am motivated this time to stop drinking."  He denied that he is ever followed up with residential treatment i.e. [facility].  Does report difficult time with sleeping.  States he is having nightmares about drinking.   He is denying cravings or any withdrawal symptoms during this assessment.  Discussed initiating Remeron to help with reported sleep issues.  Patient was receptive to plan.  Patient is currently prescribed Celexa, gabapentin and hydroxyzine.  He reports taking and tolerating well.  Reports a fair appetite. Social worker to follow-up with discharge disposition and residential treatment programs.  During evaluation Barry Moore is sitting  in no acute distress. Putting a puzzle together. he is alert/oriented x 3; calm/cooperative; and mood congruent with affect. Barry Moore is speaking in a clear tone at moderate volume, and normal pace; with good eye contact. his thought process is coherent and relevant; There is no indication that he is currently responding to internal/external stimuli or experiencing delusional thought content; and he has denied suicidal/self-harm/homicidal ideation, psychosis, and paranoia.  Patient has remained calm throughout assessment and has answered questions appropriately.      Diagnosis:  Final diagnoses:  Alcohol abuse  Homelessness  Depressed affect    Total Time spent with patient: 15 minutes  Past Psychiatric History:  Past Medical History:  Past Medical History:  Diagnosis Date   Alcoholic hepatitis    Depression    ETOH abuse    Gastric bleed 08/2018   Hypertension    Pancreatitis    Seizures ([place])    Suicidal behavior     Past Surgical History:   Procedure Laterality Date   BIOPSY  08/05/2018   Procedure: BIOPSY;  Surgeon: Irving Copas., MD;  Location: [facility] ENDOSCOPY;  Service: Gastroenterology;;   BIOPSY  08/07/2018   Procedure: BIOPSY;  Surgeon: Thornton Park, MD;  Location: [facility] ENDOSCOPY;  Service: Gastroenterology;;   BIOPSY  01/02/2019   Procedure: BIOPSY;  Surgeon: Gatha Mayer, MD;  Location: [facility] ENDOSCOPY;  Service: Endoscopy;;   COLONOSCOPY WITH PROPOFOL N/A 08/07/2018   Procedure: COLONOSCOPY WITH PROPOFOL;  Surgeon: Thornton Park, MD;  Location: [place];  Service: Gastroenterology;  Laterality: N/A;   ESOPHAGOGASTRODUODENOSCOPY N/A 01/02/2019   Procedure: ESOPHAGOGASTRODUODENOSCOPY (EGD);  Surgeon: Gatha Mayer, MD;  Location: [facility] ENDOSCOPY;  Service: Endoscopy;  Laterality: N/A;   ESOPHAGOGASTRODUODENOSCOPY N/A 07/03/2019   Procedure: ESOPHAGOGASTRODUODENOSCOPY (EGD);  Surgeon: Wonda Horner, MD;  Location: [facility] ENDOSCOPY;  Service: Endoscopy;  Laterality: N/A;   ESOPHAGOGASTRODUODENOSCOPY N/A 06/26/2020   Procedure: ESOPHAGOGASTRODUODENOSCOPY (EGD);  Surgeon: Arta Silence, MD;  Location: [facility] ENDOSCOPY;  Service: Endoscopy;  Laterality: N/A;   ESOPHAGOGASTRODUODENOSCOPY (EGD) WITH PROPOFOL N/A 11/02/2016   Procedure: ESOPHAGOGASTRODUODENOSCOPY (EGD) WITH PROPOFOL;  Surgeon: Carol Ada, MD;  Location: [facility] ENDOSCOPY;  Service: Endoscopy;  Laterality: N/A;   ESOPHAGOGASTRODUODENOSCOPY (EGD) WITH PROPOFOL N/A 08/05/2018   Procedure: ESOPHAGOGASTRODUODENOSCOPY (EGD) WITH PROPOFOL;  Surgeon: Rush Landmark Telford Nab., MD;  Location: [place];  Service: Gastroenterology;  Laterality: N/A;   ESOPHAGOGASTRODUODENOSCOPY (EGD) WITH PROPOFOL N/A 07/14/2019   Procedure: ESOPHAGOGASTRODUODENOSCOPY (EGD) WITH PROPOFOL;  Surgeon: Otis Brace, MD;  Location: [facility] ENDOSCOPY;  Service: Gastroenterology;  Laterality: N/A;   HERNIA REPAIR     LEG SURGERY  POLYPECTOMY  08/07/2018   Procedure: POLYPECTOMY;  Surgeon:  Thornton Park, MD;  Location: [facility] ENDOSCOPY;  Service: Gastroenterology;;   Family History:  Family History  Problem Relation Age of Onset   Diabetes Mother    Alcoholism Mother    Emphysema Father    Lung cancer Father    Alcoholism Father    Family Psychiatric  History:  Social History:  Social History   Substance and Sexual Activity  Alcohol Use Yes   Comment: 5+ BEERS DAILY, Patient does not know how much he drank tonight     Social History   Substance and Sexual Activity  Drug Use Not Currently   Frequency: 3.0 times per week   Types: Cocaine    Social History   Socioeconomic History   Marital status: Divorced    Spouse name: Not on file   Number of children: Not on file   Years of education: Not on file   Highest education level: Not on file  Occupational History   Not on file  Tobacco Use   Smoking status: Every Day    Packs/day: 1.00    Types: Cigarettes   Smokeless tobacco: Never  Vaping Use   Vaping Use: Never used  Substance and Sexual Activity   Alcohol use: Yes    Comment: 5+ BEERS DAILY, Patient does not know how much he drank tonight   Drug use: Not Currently    Frequency: 3.0 times per week    Types: Cocaine   Sexual activity: Not Currently  Other Topics Concern   Not on file  Social History Narrative   Not on file   Social Determinants of Health   Financial Resource Strain: Not on file  Food Insecurity: Not on file  Transportation Needs: Not on file  Physical Activity: Not on file  Stress: Not on file  Social Connections: Not on file   SDOH:  SDOH Screenings   Alcohol Screen: High Risk (07/16/2021)  Depression (PHQ2-9): High Risk (01/28/2022)  Tobacco Use: High Risk (01/26/2022)   Additional Social History:                         Sleep: Poor  Appetite:  Fair  Current Medications:  Current Facility-Administered Medications  Medication Dose Route Frequency Provider Last Rate Last Admin   acetaminophen (TYLENOL)  tablet 650 mg  650 mg Oral Q6H PRN Evette Georges, NP       alum & mag hydroxide-simeth (MAALOX/MYLANTA) 200-200-20 MG/5ML suspension 30 mL  30 mL Oral Q4H PRN Evette Georges, NP       citalopram (CELEXA) tablet 10 mg  10 mg Oral Daily Evette Georges, NP   10 mg at 01/29/22 0902   gabapentin (NEURONTIN) capsule 300 mg  300 mg Oral TID Evette Georges, NP   300 mg at 01/29/22 0902   hydrOXYzine (ATARAX) tablet 25 mg  25 mg Oral Q6H PRN Evette Georges, NP       loperamide (IMODIUM) capsule 2-4 mg  2-4 mg Oral PRN Evette Georges, NP       LORazepam (ATIVAN) tablet 1 mg  1 mg Oral Q6H PRN Evette Georges, NP       magnesium hydroxide (MILK OF MAGNESIA) suspension 30 mL  30 mL Oral Daily PRN Evette Georges, NP       mirtazapine (REMERON) tablet 15 mg  15 mg Oral QHS Derrill Center, NP       multivitamin with minerals tablet 1 tablet  1 tablet Oral Daily Evette Georges, NP   1 tablet at 01/29/22 0902   ondansetron (ZOFRAN-ODT) disintegrating tablet 4 mg  4 mg Oral Q6H PRN Evette Georges, NP       thiamine (VITAMIN B1) tablet 100 mg  100 mg Oral Daily Evette Georges, NP   100 mg at 01/29/22 0902   Current Outpatient Medications  Medication Sig Dispense Refill   citalopram (CELEXA) 10 MG tablet Take 1 tablet (10 mg total) by mouth daily. (Patient not taking: Reported on 01/26/2022) 7 tablet 0   gabapentin (NEURONTIN) 300 MG capsule Take 1 capsule (300 mg total) by mouth 3 (three) times daily. (Patient not taking: Reported on 01/26/2022) 90 capsule 0   Multiple Vitamin (MULTIVITAMIN WITH MINERALS) TABS tablet Take 1 tablet by mouth daily. (Patient not taking: Reported on 01/26/2022) 30 tablet 0   thiamine (VITAMIN B-1) 100 MG tablet Take 1 tablet (100 mg total) by mouth daily. 30 tablet 0    Labs  Lab Results:  Admission on 01/26/2022, Discharged on 01/27/2022  Component Date Value Ref Range Status   WBC 01/26/2022 5.8  4.0 - 10.5 K/uL Final   RBC 01/26/2022 4.05 (L)  4.22 - 5.81 MIL/uL Final   Hemoglobin  01/26/2022 10.9 (L)  13.0 - 17.0 g/dL Final   HCT 01/26/2022 34.5 (L)  39.0 - 52.0 % Final   MCV 01/26/2022 85.2  80.0 - 100.0 fL Final   MCH 01/26/2022 26.9  26.0 - 34.0 pg Final   MCHC 01/26/2022 31.6  30.0 - 36.0 g/dL Final   RDW 01/26/2022 16.0 (H)  11.5 - 15.5 % Final   Platelets 01/26/2022 192  150 - 400 K/uL Final   nRBC 01/26/2022 0.0  0.0 - 0.2 % Final   Neutrophils Relative % 01/26/2022 44  % Final   Neutro Abs 01/26/2022 2.5  1.7 - 7.7 K/uL Final   Lymphocytes Relative 01/26/2022 38  % Final   Lymphs Abs 01/26/2022 2.2  0.7 - 4.0 K/uL Final   Monocytes Relative 01/26/2022 15  % Final   Monocytes Absolute 01/26/2022 0.9  0.1 - 1.0 K/uL Final   Eosinophils Relative 01/26/2022 2  % Final   Eosinophils Absolute 01/26/2022 0.1  0.0 - 0.5 K/uL Final   Basophils Relative 01/26/2022 1  % Final   Basophils Absolute 01/26/2022 0.1  0.0 - 0.1 K/uL Final   Immature Granulocytes 01/26/2022 0  % Final   Abs Immature Granulocytes 01/26/2022 0.02  0.00 - 0.07 K/uL Final   Performed at [facility], [place] [address], [place], [place] [address]   Sodium 01/26/2022 136  135 - 145 mmol/L Final   Potassium 01/26/2022 3.7  3.5 - 5.1 mmol/L Final   Chloride 01/26/2022 105  98 - 111 mmol/L Final   CO2 01/26/2022 21 (L)  22 - 32 mmol/L Final   Glucose, Bld 01/26/2022 109 (H)  70 - 99 mg/dL Final   Glucose reference range applies only to samples taken after fasting for at least 8 hours.   BUN 01/26/2022 9  6 - 20 mg/dL Final   Creatinine, Ser 01/26/2022 0.59 (L)  0.61 - 1.24 mg/dL Final   Calcium 01/26/2022 9.5  8.9 - 10.3 mg/dL Final   Total Protein 01/26/2022 8.3 (H)  6.5 - 8.1 g/dL Final   Albumin 01/26/2022 4.2  3.5 - 5.0 g/dL Final   AST 01/26/2022 153 (H)  15 - 41 U/L Final   ALT 01/26/2022 90 (H)  0 - 44  U/L Final   Alkaline Phosphatase 01/26/2022 133 (H)  38 - 126 U/L Final   Total Bilirubin 01/26/2022 0.5  0.3 - 1.2 mg/dL Final   GFR, Estimated 01/26/2022 >60  >60  mL/min Final   Comment: (NOTE) Calculated using the CKD-EPI Creatinine Equation (2021)    Anion gap 01/26/2022 10  5 - 15 Final   Performed at [facility], [place] [address], [place], [place] [address]   Magnesium 01/26/2022 2.5 (H)  1.7 - 2.4 mg/dL Final   Performed at [place] [address], [place], [place] [address]   Alcohol, Ethyl (B) 01/26/2022 336 (HH)  <10 mg/dL Final   Comment: CRITICAL RESULT CALLED TO, READ BACK BY AND VERIFIED WITH ROHR,A AT 2128 ON 01/26/22 BY VAZQUEZJ (NOTE) Lowest detectable limit for serum alcohol is 10 mg/dL.  For medical purposes only. Performed at [facility], [place] [address] [place], [place] [address]    Opiates 01/27/2022 NONE DETECTED  NONE DETECTED Final   Cocaine 01/27/2022 NONE DETECTED  NONE DETECTED Final   Benzodiazepines 01/27/2022 POSITIVE (A)  NONE DETECTED Final   Amphetamines 01/27/2022 NONE DETECTED  NONE DETECTED Final   Tetrahydrocannabinol 01/27/2022 NONE DETECTED  NONE DETECTED Final   Barbiturates 01/27/2022 NONE DETECTED  NONE DETECTED Final   Comment: (NOTE) DRUG SCREEN FOR MEDICAL PURPOSES ONLY.  IF CONFIRMATION IS NEEDED FOR ANY PURPOSE, NOTIFY LAB WITHIN 5 DAYS.  LOWEST DETECTABLE LIMITS FOR URINE DRUG SCREEN Drug Class                     Cutoff (ng/mL) Amphetamine and metabolites    1000 Barbiturate and metabolites    200 Benzodiazepine                 200 Opiates and metabolites        300 Cocaine and metabolites        300 THC                            50 Performed at [facility], [place] [address] [place], [place] [address]    Color, Urine 01/27/2022 YELLOW  YELLOW Final   APPearance 01/27/2022 CLEAR  CLEAR Final   Specific Gravity, Urine 01/27/2022 1.024  1.005 - 1.030 Final   pH 01/27/2022 5.0  5.0 - 8.0 Final   Glucose, UA 01/27/2022 NEGATIVE  NEGATIVE mg/dL Final   Hgb urine dipstick 01/27/2022 NEGATIVE  NEGATIVE Final    Bilirubin Urine 01/27/2022 NEGATIVE  NEGATIVE Final   Ketones, ur 01/27/2022 NEGATIVE  NEGATIVE mg/dL Final   Protein, ur 01/27/2022 NEGATIVE  NEGATIVE mg/dL Final   Nitrite 01/27/2022 NEGATIVE  NEGATIVE Final   Leukocytes,Ua 01/27/2022 TRACE (A)  NEGATIVE Final   RBC / HPF 01/27/2022 0-5  0 - 5 RBC/hpf Final   WBC, UA 01/27/2022 6-10  0 - 5 WBC/hpf Final   Bacteria, UA 01/27/2022 NONE SEEN  NONE SEEN Final   Squamous Epithelial / LPF 01/27/2022 0-5  0 - 5 Final   Mucus 01/27/2022 PRESENT   Final   Performed at [facility], [place] [address] [place], [place] [address]   SARS Coronavirus 2 by RT PCR 01/27/2022 NEGATIVE  NEGATIVE Final   Comment: (NOTE) SARS-CoV-2 target nucleic acids are NOT DETECTED.  The SARS-CoV-2 RNA is generally detectable in upper respiratory specimens during the acute phase of infection. The lowest concentration of SARS-CoV-2  viral copies this assay can detect is 138 copies/mL. A negative result does not preclude SARS-Cov-2 infection and should not be used as the sole basis for treatment or other patient management decisions. A negative result may occur with  improper specimen collection/handling, submission of specimen other than nasopharyngeal swab, presence of viral mutation(s) within the areas targeted by this assay, and inadequate number of viral copies(<138 copies/mL). A negative result must be combined with clinical observations, patient history, and epidemiological information. The expected result is Negative.  Fact Sheet for Patients:  EntrepreneurPulse.com.au  Fact Sheet for Healthcare Providers:  IncredibleEmployment.be  This test is no                          t yet approved or cleared by the [place] FDA and  has been authorized for detection and/or diagnosis of SARS-CoV-2 by FDA under an Emergency Use Authorization (EUA). This EUA will remain  in effect (meaning this test can be used)  for the duration of the COVID-19 declaration under Section 564(b)(1) of the Act, 21 U.S.C.section 360bbb-3(b)(1), unless the authorization is terminated  or revoked sooner.       Influenza A by PCR 01/27/2022 NEGATIVE  NEGATIVE Final   Influenza B by PCR 01/27/2022 NEGATIVE  NEGATIVE Final   Comment: (NOTE) The Xpert Xpress SARS-CoV-2/FLU/RSV plus assay is intended as an aid in the diagnosis of influenza from Nasopharyngeal swab specimens and should not be used as a sole basis for treatment. Nasal washings and aspirates are unacceptable for Xpert Xpress SARS-CoV-2/FLU/RSV testing.  Fact Sheet for Patients: EntrepreneurPulse.com.au  Fact Sheet for Healthcare Providers: IncredibleEmployment.be  This test is not yet approved or cleared by the [place] FDA and has been authorized for detection and/or diagnosis of SARS-CoV-2 by FDA under an Emergency Use Authorization (EUA). This EUA will remain in effect (meaning this test can be used) for the duration of the COVID-19 declaration under Section 564(b)(1) of the Act, 21 U.S.C. section 360bbb-3(b)(1), unless the authorization is terminated or revoked.  Performed at [facility], [place] [address] [place], [place] [address]   Admission on 01/24/2022, Discharged on 01/26/2022  Component Date Value Ref Range Status   Sodium 01/24/2022 139  135 - 145 mmol/L Final   Potassium 01/24/2022 3.8  3.5 - 5.1 mmol/L Final   Chloride 01/24/2022 107  98 - 111 mmol/L Final   CO2 01/24/2022 24  22 - 32 mmol/L Final   Glucose, Bld 01/24/2022 95  70 - 99 mg/dL Final   Glucose reference range applies only to samples taken after fasting for at least 8 hours.   BUN 01/24/2022 10  6 - 20 mg/dL Final   Creatinine, Ser 01/24/2022 0.79  0.61 - 1.24 mg/dL Final   Calcium 01/24/2022 8.4 (L)  8.9 - 10.3 mg/dL Final   Total Protein 01/24/2022 7.5  6.5 - 8.1 g/dL Final   Albumin 01/24/2022 3.8  3.5 -  5.0 g/dL Final   AST 01/24/2022 127 (H)  15 - 41 U/L Final   ALT 01/24/2022 78 (H)  0 - 44 U/L Final   Alkaline Phosphatase 01/24/2022 142 (H)  38 - 126 U/L Final   Total Bilirubin 01/24/2022 0.4  0.3 - 1.2 mg/dL Final   GFR, Estimated 01/24/2022 >60  >60 mL/min Final   Comment: (NOTE) Calculated using the CKD-EPI Creatinine Equation (2021)    Anion gap 01/24/2022 8  5 - 15 Final   Performed at  [facility], [place] [address], [place], [place] [address]   Alcohol, Ethyl (B) 01/24/2022 268 (H)  <10 mg/dL Final   Comment: (NOTE) Lowest detectable limit for serum alcohol is 10 mg/dL.  For medical purposes only. Performed at [facility], [place] [address] [place], [place] [address]    Opiates 01/25/2022 NONE DETECTED  NONE DETECTED Final   Cocaine 01/25/2022 NONE DETECTED  NONE DETECTED Final   Benzodiazepines 01/25/2022 NONE DETECTED  NONE DETECTED Final   Amphetamines 01/25/2022 NONE DETECTED  NONE DETECTED Final   Tetrahydrocannabinol 01/25/2022 NONE DETECTED  NONE DETECTED Final   Barbiturates 01/25/2022 NONE DETECTED  NONE DETECTED Final   Comment: (NOTE) DRUG SCREEN FOR MEDICAL PURPOSES ONLY.  IF CONFIRMATION IS NEEDED FOR ANY PURPOSE, NOTIFY LAB WITHIN 5 DAYS.  LOWEST DETECTABLE LIMITS FOR URINE DRUG SCREEN Drug Class                     Cutoff (ng/mL) Amphetamine and metabolites    1000 Barbiturate and metabolites    200 Benzodiazepine                 200 Opiates and metabolites        300 Cocaine and metabolites        300 THC                            50 Performed at [facility], [place] [address] [place], [place] [address]    WBC 01/24/2022 4.8  4.0 - 10.5 K/uL Final   RBC 01/24/2022 3.48 (L)  4.22 - 5.81 MIL/uL Final   Hemoglobin 01/24/2022 9.5 (L)  13.0 - 17.0 g/dL Final   HCT 01/24/2022 30.0 (L)  39.0 - 52.0 % Final   MCV 01/24/2022 86.2  80.0 - 100.0 fL Final   MCH 01/24/2022 27.3  26.0 - 34.0 pg Final    MCHC 01/24/2022 31.7  30.0 - 36.0 g/dL Final   RDW 01/24/2022 16.6 (H)  11.5 - 15.5 % Final   Platelets 01/24/2022 165  150 - 400 K/uL Final   nRBC 01/24/2022 0.0  0.0 - 0.2 % Final   Neutrophils Relative % 01/24/2022 33  % Final   Neutro Abs 01/24/2022 1.6 (L)  1.7 - 7.7 K/uL Final   Lymphocytes Relative 01/24/2022 47  % Final   Lymphs Abs 01/24/2022 2.3  0.7 - 4.0 K/uL Final   Monocytes Relative 01/24/2022 15  % Final   Monocytes Absolute 01/24/2022 0.7  0.1 - 1.0 K/uL Final   Eosinophils Relative 01/24/2022 3  % Final   Eosinophils Absolute 01/24/2022 0.2  0.0 - 0.5 K/uL Final   Basophils Relative 01/24/2022 2  % Final   Basophils Absolute 01/24/2022 0.1  0.0 - 0.1 K/uL Final   Immature Granulocytes 01/24/2022 0  % Final   Abs Immature Granulocytes 01/24/2022 0.00  0.00 - 0.07 K/uL Final   Performed at [facility], [place] [address], [place], [place] [address]   WBC 01/25/2022 4.6  4.0 - 10.5 K/uL Final   RBC 01/25/2022 4.00 (L)  4.22 - 5.81 MIL/uL Final   Hemoglobin 01/25/2022 10.6 (L)  13.0 - 17.0 g/dL Final   HCT 01/25/2022 34.3 (L)  39.0 - 52.0 % Final   MCV 01/25/2022 85.8  80.0 - 100.0 fL Final   MCH 01/25/2022 26.5  26.0 - 34.0 pg Final   MCHC 01/25/2022 30.9  30.0 - 36.0 g/dL Final   RDW 01/25/2022 16.5 (H)  11.5 - 15.5 % Final   Platelets 01/25/2022 199  150 - 400 K/uL Final   nRBC 01/25/2022 0.0  0.0 - 0.2 % Final   Performed at [facility], [place] [address] [place], [place] [address]  Admission on 01/22/2022, Discharged on 01/23/2022  Component Date Value Ref Range Status   Sodium 01/22/2022 139  135 - 145 mmol/L Final   Potassium 01/22/2022 4.2  3.5 - 5.1 mmol/L Final   Chloride 01/22/2022 102  98 - 111 mmol/L Final   CO2 01/22/2022 21 (L)  22 - 32 mmol/L Final   Glucose, Bld 01/22/2022 105 (H)  70 - 99 mg/dL Final   Glucose reference range applies only to samples taken after fasting for at least 8 hours.   BUN 01/22/2022 8  6 -  20 mg/dL Final   Creatinine, Ser 01/22/2022 0.72  0.61 - 1.24 mg/dL Final   Calcium 01/22/2022 9.7  8.9 - 10.3 mg/dL Final   Total Protein 01/22/2022 8.0  6.5 - 8.1 g/dL Final   Albumin 01/22/2022 4.1  3.5 - 5.0 g/dL Final   AST 01/22/2022 216 (H)  15 - 41 U/L Final   ALT 01/22/2022 115 (H)  0 - 44 U/L Final   Alkaline Phosphatase 01/22/2022 186 (H)  38 - 126 U/L Final   Total Bilirubin 01/22/2022 0.5  0.3 - 1.2 mg/dL Final   GFR, Estimated 01/22/2022 >60  >60 mL/min Final   Comment: (NOTE) Calculated using the CKD-EPI Creatinine Equation (2021)    Anion gap 01/22/2022 16 (H)  5 - 15 Final   Performed at [place] [address] [place], [place] [address]   WBC 01/22/2022 5.9  4.0 - 10.5 K/uL Final   RBC 01/22/2022 4.09 (L)  4.22 - 5.81 MIL/uL Final   Hemoglobin 01/22/2022 10.9 (L)  13.0 - 17.0 g/dL Final   HCT 01/22/2022 35.1 (L)  39.0 - 52.0 % Final   MCV 01/22/2022 85.8  80.0 - 100.0 fL Final   MCH 01/22/2022 26.7  26.0 - 34.0 pg Final   MCHC 01/22/2022 31.1  30.0 - 36.0 g/dL Final   RDW 01/22/2022 16.5 (H)  11.5 - 15.5 % Final   Platelets 01/22/2022 192  150 - 400 K/uL Final   nRBC 01/22/2022 0.0  0.0 - 0.2 % Final   Neutrophils Relative % 01/22/2022 43  % Final   Neutro Abs 01/22/2022 2.5  1.7 - 7.7 K/uL Final   Lymphocytes Relative 01/22/2022 38  % Final   Lymphs Abs 01/22/2022 2.2  0.7 - 4.0 K/uL Final   Monocytes Relative 01/22/2022 16  % Final   Monocytes Absolute 01/22/2022 0.9  0.1 - 1.0 K/uL Final   Eosinophils Relative 01/22/2022 2  % Final   Eosinophils Absolute 01/22/2022 0.1  0.0 - 0.5 K/uL Final   Basophils Relative 01/22/2022 1  % Final   Basophils Absolute 01/22/2022 0.1  0.0 - 0.1 K/uL Final   Immature Granulocytes 01/22/2022 0  % Final   Abs Immature Granulocytes 01/22/2022 0.02  0.00 - 0.07 K/uL Final   Performed at [facility], [place] [address], [place], [place] [address]   Alcohol, Ethyl (B) 01/22/2022 340 (HH)  <10 mg/dL Final   Comment:  CRITICAL RESULT CALLED TO, READ BACK BY AND VERIFIED WITH Assunta Found, RN, 2232 01/22/22, A. RAMSEY (NOTE) Lowest detectable limit for serum alcohol is 10 mg/dL.  For medical purposes only. Performed at [facility], [place] [address], [place], [place] [address]    Opiates 01/22/2022 NONE DETECTED  NONE DETECTED Final   Cocaine 01/22/2022 NONE DETECTED  NONE DETECTED Final   Benzodiazepines 01/22/2022 NONE DETECTED  NONE DETECTED Final   Amphetamines 01/22/2022 NONE DETECTED  NONE DETECTED Final   Tetrahydrocannabinol 01/22/2022 NONE DETECTED  NONE DETECTED Final   Barbiturates 01/22/2022 NONE DETECTED  NONE DETECTED Final   Comment: (NOTE) DRUG SCREEN FOR MEDICAL PURPOSES ONLY.  IF CONFIRMATION IS NEEDED FOR ANY PURPOSE, NOTIFY LAB WITHIN 5 DAYS.  LOWEST DETECTABLE LIMITS FOR URINE DRUG SCREEN Drug Class                     Cutoff (ng/mL) Amphetamine and metabolites    1000 Barbiturate and metabolites    200 Benzodiazepine                 200 Opiates and metabolites        300 Cocaine and metabolites        300 THC                            50 Performed at [facility], [place] [address] [place], [place] [address]    Color, Urine 01/22/2022 COLORLESS (A)  YELLOW Final   APPearance 01/22/2022 CLEAR  CLEAR Final   Specific Gravity, Urine 01/22/2022 1.002 (L)  1.005 - 1.030 Final   pH 01/22/2022 5.0  5.0 - 8.0 Final   Glucose, UA 01/22/2022 NEGATIVE  NEGATIVE mg/dL Final   Hgb urine dipstick 01/22/2022 NEGATIVE  NEGATIVE Final   Bilirubin Urine 01/22/2022 NEGATIVE  NEGATIVE Final   Ketones, ur 01/22/2022 NEGATIVE  NEGATIVE mg/dL Final   Protein, ur 01/22/2022 NEGATIVE  NEGATIVE mg/dL Final   Nitrite 01/22/2022 NEGATIVE  NEGATIVE Final   Leukocytes,Ua 01/22/2022 NEGATIVE  NEGATIVE Final   Performed at [place] [address] [place], [place] [address]  Admission on 01/19/2022, Discharged on 01/21/2022  Component Date Value Ref Range Status   SARS  Coronavirus 2 by RT PCR 01/19/2022 NEGATIVE  NEGATIVE Final   Comment: (NOTE) SARS-CoV-2 target nucleic acids are NOT DETECTED.  The SARS-CoV-2 RNA is generally detectable in upper respiratory specimens during the acute phase of infection. The lowest concentration of SARS-CoV-2 viral copies this assay can detect is 138 copies/mL. A negative result does not preclude SARS-Cov-2 infection and should not be used as the sole basis for treatment or other patient management decisions. A negative result may occur with  improper specimen collection/handling, submission of specimen other than nasopharyngeal swab, presence of viral mutation(s) within the areas targeted by this assay, and inadequate number of viral copies(<138 copies/mL). A negative result must be combined with clinical observations, patient history, and epidemiological information. The expected result is Negative.  Fact Sheet for Patients:  EntrepreneurPulse.com.au  Fact Sheet for Healthcare Providers:  IncredibleEmployment.be  This test is no                          t yet approved or cleared by the [place] FDA and  has been authorized for detection and/or diagnosis of SARS-CoV-2 by FDA under an Emergency Use Authorization (EUA). This EUA will remain  in effect (meaning this test can be used) for the duration of the COVID-19 declaration under Section 564(b)(1) of the Act, 21  U.S.C.section 360bbb-3(b)(1), unless the authorization is terminated  or revoked sooner.       Influenza A by PCR 01/19/2022 NEGATIVE  NEGATIVE Final   Influenza B by PCR 01/19/2022 NEGATIVE  NEGATIVE Final   Comment: (NOTE) The Xpert Xpress SARS-CoV-2/FLU/RSV plus assay is intended as an aid in the diagnosis of influenza from Nasopharyngeal swab specimens and should not be used as a sole basis for treatment. Nasal washings and aspirates are unacceptable for Xpert Xpress SARS-CoV-2/FLU/RSV testing.  Fact  Sheet for Patients: EntrepreneurPulse.com.au  Fact Sheet for Healthcare Providers: IncredibleEmployment.be  This test is not yet approved or cleared by the [place] FDA and has been authorized for detection and/or diagnosis of SARS-CoV-2 by FDA under an Emergency Use Authorization (EUA). This EUA will remain in effect (meaning this test can be used) for the duration of the COVID-19 declaration under Section 564(b)(1) of the Act, 21 U.S.C. section 360bbb-3(b)(1), unless the authorization is terminated or revoked.  Performed at [facility], [place] [address], [place], [place] [address]    WBC 01/19/2022 4.8  4.0 - 10.5 K/uL Final   RBC 01/19/2022 3.95 (L)  4.22 - 5.81 MIL/uL Final   Hemoglobin 01/19/2022 10.8 (L)  13.0 - 17.0 g/dL Final   HCT 01/19/2022 33.2 (L)  39.0 - 52.0 % Final   MCV 01/19/2022 84.1  80.0 - 100.0 fL Final   MCH 01/19/2022 27.3  26.0 - 34.0 pg Final   MCHC 01/19/2022 32.5  30.0 - 36.0 g/dL Final   RDW 01/19/2022 16.7 (H)  11.5 - 15.5 % Final   Platelets 01/19/2022 161  150 - 400 K/uL Final   nRBC 01/19/2022 0.0  0.0 - 0.2 % Final   Neutrophils Relative % 01/19/2022 40  % Final   Neutro Abs 01/19/2022 2.0  1.7 - 7.7 K/uL Final   Lymphocytes Relative 01/19/2022 38  % Final   Lymphs Abs 01/19/2022 1.8  0.7 - 4.0 K/uL Final   Monocytes Relative 01/19/2022 17  % Final   Monocytes Absolute 01/19/2022 0.8  0.1 - 1.0 K/uL Final   Eosinophils Relative 01/19/2022 3  % Final   Eosinophils Absolute 01/19/2022 0.1  0.0 - 0.5 K/uL Final   Basophils Relative 01/19/2022 2  % Final   Basophils Absolute 01/19/2022 0.1  0.0 - 0.1 K/uL Final   Immature Granulocytes 01/19/2022 0  % Final   Abs Immature Granulocytes 01/19/2022 0.02  0.00 - 0.07 K/uL Final   Performed at [facility], [place] [address] [place], [place] [address]   Sodium 01/19/2022 138  135 - 145 mmol/L Final   Potassium 01/19/2022 3.8  3.5 - 5.1 mmol/L Final    Chloride 01/19/2022 102  98 - 111 mmol/L Final   CO2 01/19/2022 22  22 - 32 mmol/L Final   Glucose, Bld 01/19/2022 89  70 - 99 mg/dL Final   Glucose reference range applies only to samples taken after fasting for at least 8 hours.   BUN 01/19/2022 7  6 - 20 mg/dL Final   Creatinine, Ser 01/19/2022 0.64  0.61 - 1.24 mg/dL Final   Calcium 01/19/2022 9.2  8.9 - 10.3 mg/dL Final   Total Protein 01/19/2022 8.1  6.5 - 8.1 g/dL Final   Albumin 01/19/2022 4.0  3.5 - 5.0 g/dL Final   AST 01/19/2022 140 (H)  15 - 41 U/L Final   ALT 01/19/2022 68 (H)  0 - 44 U/L Final   Alkaline Phosphatase 01/19/2022 144 (H)  38 - 126 U/L Final   Total Bilirubin 01/19/2022 0.4  0.3 - 1.2 mg/dL Final   GFR, Estimated 01/19/2022 >60  >60 mL/min Final   Comment: (NOTE) Calculated using the CKD-EPI Creatinine Equation (2021)    Anion gap 01/19/2022 14  5 - 15 Final   Performed at [place] [address] [place], [place] [address]   Hgb A1c MFr Bld 01/19/2022 5.3  4.8 - 5.6 % Final   Comment: (NOTE) Pre diabetes:          5.7%-6.4%  Diabetes:              >6.4%  Glycemic control for   <7.0% adults with diabetes    Mean Plasma Glucose 01/19/2022 105.41  mg/dL Final   Performed at [facility], [place] [address] [place], [place] [address]   Alcohol, Ethyl (B) 01/19/2022 290 (H)  <10 mg/dL Final   Comment: (NOTE) Lowest detectable limit for serum alcohol is 10 mg/dL.  For medical purposes only. Performed at [facility], [place] [address], [place], [place] [address]    Cholesterol 01/19/2022 251 (H)  0 - 200 mg/dL Final   Triglycerides 01/19/2022 163 (H)  <150 mg/dL Final   HDL 01/19/2022 105  >40 mg/dL Final   Total CHOL/HDL Ratio 01/19/2022 2.4  RATIO Final   VLDL 01/19/2022 33  0 - 40 mg/dL Final   LDL Cholesterol 01/19/2022 113 (H)  0 - 99 mg/dL Final   Comment:        Total Cholesterol/HDL:CHD Risk Coronary Heart Disease Risk Table                     Men   Women  1/2 Average  Risk   3.4   3.3  Average Risk       5.0   4.4  2 X Average Risk   9.6   7.1  3 X Average Risk  23.4   11.0        Use the calculated Patient Ratio above and the CHD Risk Table to determine the patient's CHD Risk.        ATP III CLASSIFICATION (LDL):  <100     mg/dL   Optimal  100-129  mg/dL   Near or Above                    Optimal  130-159  mg/dL   Borderline  160-189  mg/dL   High  >190     mg/dL   Very High Performed at [facility] [address], [place], [place] [address]    TSH 01/19/2022 0.415  0.350 - 4.500 uIU/mL Final   Comment: Performed by a 3rd Generation assay with a functional sensitivity of <=0.01 uIU/mL. Performed at [facility], [place] [address], [name], [place] [address]    POC Amphetamine UR 01/19/2022 None Detected  NONE DETECTED (Cut Off Level 1000 ng/mL) Final   POC Secobarbital (BAR) 01/19/2022 None Detected  NONE DETECTED (Cut Off Level 300 ng/mL) Final   POC Buprenorphine (BUP) 01/19/2022 None Detected  NONE DETECTED (Cut Off Level 10 ng/mL) Final   POC Oxazepam (BZO) 01/19/2022 None Detected  NONE DETECTED (Cut Off Level 300 ng/mL) Final   POC Cocaine UR 01/19/2022 None Detected  NONE DETECTED (Cut Off Level 300 ng/mL) Final   POC Methamphetamine UR 01/19/2022 None Detected  NONE DETECTED (Cut Off Level 1000 ng/mL) Final  POC Morphine 01/19/2022 None Detected  NONE DETECTED (Cut Off Level 300 ng/mL) Final   POC Methadone UR 01/19/2022 None Detected  NONE DETECTED (Cut Off Level 300 ng/mL) Final   POC Oxycodone UR 01/19/2022 None Detected  NONE DETECTED (Cut Off Level 100 ng/mL) Final   POC Marijuana UR 01/19/2022 None Detected  NONE DETECTED (Cut Off Level 50 ng/mL) Final   SARSCOV2ONAVIRUS 2 AG 01/19/2022 NEGATIVE  NEGATIVE Final   Comment: (NOTE) SARS-CoV-2 antigen NOT DETECTED.   Negative results are presumptive.  Negative results do not preclude SARS-CoV-2 infection and should not be used as the sole basis for treatment or other  patient management decisions, including infection  control decisions, particularly in the presence of clinical signs and  symptoms consistent with COVID-19, or in those who have been in contact with the virus.  Negative results must be combined with clinical observations, patient history, and epidemiological information. The expected result is Negative.  Fact Sheet for Patients: HandmadeRecipes.com.cy  Fact Sheet for Healthcare Providers: FuneralLife.at  This test is not yet approved or cleared by the [place] FDA and  has been authorized for detection and/or diagnosis of SARS-CoV-2 by FDA under an Emergency Use Authorization (EUA).  This EUA will remain in effect (meaning this test can be used) for the duration of  the COV                          ID-19 declaration under Section 564(b)(1) of the Act, 21 U.S.C. section 360bbb-3(b)(1), unless the authorization is terminated or revoked sooner.    Admission on 01/11/2022, Discharged on 01/12/2022  Component Date Value Ref Range Status   WBC 01/11/2022 6.0  4.0 - 10.5 K/uL Final   RBC 01/11/2022 3.96 (L)  4.22 - 5.81 MIL/uL Final   Hemoglobin 01/11/2022 11.1 (L)  13.0 - 17.0 g/dL Final   HCT 01/11/2022 33.1 (L)  39.0 - 52.0 % Final   MCV 01/11/2022 83.6  80.0 - 100.0 fL Final   MCH 01/11/2022 28.0  26.0 - 34.0 pg Final   MCHC 01/11/2022 33.5  30.0 - 36.0 g/dL Final   RDW 01/11/2022 17.0 (H)  11.5 - 15.5 % Final   Platelets 01/11/2022 115 (L)  150 - 400 K/uL Final   nRBC 01/11/2022 0.0  0.0 - 0.2 % Final   Neutrophils Relative % 01/11/2022 54  % Final   Neutro Abs 01/11/2022 3.3  1.7 - 7.7 K/uL Final   Lymphocytes Relative 01/11/2022 29  % Final   Lymphs Abs 01/11/2022 1.7  0.7 - 4.0 K/uL Final   Monocytes Relative 01/11/2022 14  % Final   Monocytes Absolute 01/11/2022 0.8  0.1 - 1.0 K/uL Final   Eosinophils Relative 01/11/2022 1  % Final   Eosinophils Absolute 01/11/2022 0.1  0.0  - 0.5 K/uL Final   Basophils Relative 01/11/2022 2  % Final   Basophils Absolute 01/11/2022 0.1  0.0 - 0.1 K/uL Final   Immature Granulocytes 01/11/2022 0  % Final   Abs Immature Granulocytes 01/11/2022 0.02  0.00 - 0.07 K/uL Final   Performed at [facility], [place] [address], [place], [place] [address]   Sodium 01/11/2022 136  135 - 145 mmol/L Final   Potassium 01/11/2022 3.7  3.5 - 5.1 mmol/L Final   Chloride 01/11/2022 101  98 - 111 mmol/L Final   CO2 01/11/2022 21 (L)  22 - 32 mmol/L Final   Glucose, Bld 01/11/2022 106 (H)  70 - 99 mg/dL Final   Glucose reference range applies only to samples taken after fasting for at least 8 hours.   BUN 01/11/2022 5 (L)  6 - 20 mg/dL Final   Creatinine, Ser 01/11/2022 0.66  0.61 - 1.24 mg/dL Final   Calcium 01/11/2022 8.5 (L)  8.9 - 10.3 mg/dL Final   Total Protein 01/11/2022 8.0  6.5 - 8.1 g/dL Final   Albumin 01/11/2022 4.1  3.5 - 5.0 g/dL Final   AST 01/11/2022 149 (H)  15 - 41 U/L Final   ALT 01/11/2022 63 (H)  0 - 44 U/L Final   Alkaline Phosphatase 01/11/2022 131 (H)  38 - 126 U/L Final   Total Bilirubin 01/11/2022 0.7  0.3 - 1.2 mg/dL Final   GFR, Estimated 01/11/2022 >60  >60 mL/min Final   Comment: (NOTE) Calculated using the CKD-EPI Creatinine Equation (2021)    Anion gap 01/11/2022 14  5 - 15 Final   Performed at [place] [address] [place], [place] [address]   Alcohol, Ethyl (B) 01/11/2022 351 (HH)  <10 mg/dL Final   Comment: CRITICAL RESULT CALLED TO, READ BACK BY AND VERIFIED WITH B. SANGALANG RN, 0014, 01/12/22, EADEDOKUN (NOTE) Lowest detectable limit for serum alcohol is 10 mg/dL.  For medical purposes only. Performed at [facility], [place] [address], [place],  [address]   Admission on 12/08/2021, Discharged on 12/09/2021  Component Date Value Ref Range Status   Sodium 12/08/2021 140  135 - 145 mmol/L Final   Potassium 12/08/2021 3.8  3.5 - 5.1 mmol/L Final   Chloride 12/08/2021 103  98  - 111 mmol/L Final   CO2 12/08/2021 24  22 - 32 mmol/L Final   Glucose, Bld 12/08/2021 93  70 - 99 mg/dL Final   Glucose reference range applies only to samples taken after fasting for at least 8 hours.   BUN 12/08/2021 5 (L)  6 - 20 mg/dL Final   Creatinine, Ser 12/08/2021 0.66  0.61 - 1.24 mg/dL Final   Calcium 12/08/2021 8.7 (L)  8.9 - 10.3 mg/dL Final   Total Protein 12/08/2021 8.0  6.5 - 8.1 g/dL Final   Albumin 12/08/2021 4.2  3.5 - 5.0 g/dL Final   AST 12/08/2021 176 (H)  15 - 41 U/L Final   ALT 12/08/2021 75 (H)  0 - 44 U/L Final   Alkaline Phosphatase 12/08/2021 152 (H)  38 - 126 U/L Final   Total Bilirubin 12/08/2021 0.7  0.3 - 1.2 mg/dL Final   GFR, Estimated 12/08/2021 >60  >60 mL/min Final   Comment: (NOTE) Calculated using the CKD-EPI Creatinine Equation (2021)    Anion gap 12/08/2021 13  5 - 15 Final   Performed at [facility], [place] [address], [place], [place] [address]   Alcohol, Ethyl (B) 12/08/2021 372 (HH)  <10 mg/dL Final   Comment: CRITICAL RESULT CALLED TO, READ BACK BY AND VERIFIED WITH RIVERS,C AT 2216 ON 12/08/21 BY [email or web address] (NOTE) Lowest detectable limit for serum alcohol is 10 mg/dL.  For medical purposes only. Performed at [facility], [place] [address], [place], [place] [address]    Salicylate Lvl 09/12/7626 <7.0 (L)  7.0 - 30.0 mg/dL Final   Performed at [place] [address], [place], [place] [address]   Acetaminophen (Tylenol), Serum 12/08/2021 <10 (L)  10 - 30 ug/mL Final   Comment: (NOTE) Therapeutic concentrations vary significantly. A range of 10-30 ug/mL  may  be an effective concentration for many patients. However, some  are best treated at concentrations outside of this range. Acetaminophen concentrations >150 ug/mL at 4 hours after ingestion  and >50 ug/mL at 12 hours after ingestion are often associated with  toxic reactions.  Performed at [facility], [place] [address], [place], [place] [address]    WBC 12/08/2021 4.0  4.0 - 10.5 K/uL Final   RBC 12/08/2021 4.26  4.22 - 5.81 MIL/uL Final   Hemoglobin 12/08/2021 11.6 (L)  13.0 - 17.0 g/dL Final   HCT 12/08/2021 36.1 (L)  39.0 - 52.0 % Final   MCV 12/08/2021 84.7  80.0 - 100.0 fL Final   MCH 12/08/2021 27.2  26.0 - 34.0 pg Final   MCHC 12/08/2021 32.1  30.0 - 36.0 g/dL Final   RDW 12/08/2021 17.2 (H)  11.5 - 15.5 % Final   Platelets 12/08/2021 120 (L)  150 - 400 K/uL Final   nRBC 12/08/2021 0.0  0.0 - 0.2 % Final   Performed at [facility], [place] [address] [place], [place] [address]   Opiates 12/08/2021 NONE DETECTED  NONE DETECTED Final   Cocaine 12/08/2021 NONE DETECTED  NONE DETECTED Final   Benzodiazepines 12/08/2021 NONE DETECTED  NONE DETECTED Final   Amphetamines 12/08/2021 NONE DETECTED  NONE DETECTED Final   Tetrahydrocannabinol 12/08/2021 NONE DETECTED  NONE DETECTED Final   Barbiturates 12/08/2021 NONE DETECTED  NONE DETECTED Final   Comment: (NOTE) DRUG SCREEN FOR MEDICAL PURPOSES ONLY.  IF CONFIRMATION IS NEEDED FOR ANY PURPOSE, NOTIFY LAB WITHIN 5 DAYS.  LOWEST DETECTABLE LIMITS FOR URINE DRUG SCREEN Drug Class                     Cutoff (ng/mL) Amphetamine and metabolites    1000 Barbiturate and metabolites    200 Benzodiazepine                 299 Tricyclics and metabolites     300 Opiates and metabolites        300 Cocaine and metabolites        300 THC                            50 Performed at [facility], [place] [address], [place], [place] [address]   Admission on 12/06/2021, Discharged on 12/07/2021  Component Date Value Ref Range Status   Sodium 12/06/2021 138  135 - 145 mmol/L Final   Potassium 12/06/2021 3.6  3.5 - 5.1 mmol/L Final   Chloride 12/06/2021 103  98 - 111 mmol/L Final   CO2 12/06/2021 26  22 - 32 mmol/L Final   Glucose, Bld 12/06/2021 94  70 - 99 mg/dL Final   Glucose reference range applies only to  samples taken after fasting for at least 8 hours.   BUN 12/06/2021 <5 (L)  6 - 20 mg/dL Final   Creatinine, Ser 12/06/2021 0.77  0.61 - 1.24 mg/dL Final   Calcium 12/06/2021 9.0  8.9 - 10.3 mg/dL Final   Total Protein 12/06/2021 8.2 (H)  6.5 - 8.1 g/dL Final   Albumin 12/06/2021 4.1  3.5 - 5.0 g/dL Final   AST 12/06/2021 149 (H)  15 - 41 U/L Final   ALT 12/06/2021 66 (H)  0 - 44 U/L Final   Alkaline Phosphatase 12/06/2021 147 (H)  38 - 126 U/L Final   Total Bilirubin 12/06/2021 0.6  0.3 - 1.2 mg/dL Final   GFR, Estimated 12/06/2021 >60  >60 mL/min Final   Comment: (NOTE) Calculated using the CKD-EPI Creatinine Equation (2021)    Anion gap 12/06/2021 9  5 - 15 Final   Performed at [facility], [place] [address] [place], [place] [address]   Alcohol, Ethyl (B) 12/06/2021 380 (HH)  <10 mg/dL Final   Comment: CRITICAL RESULT CALLED TO, READ BACK BY AND VERIFIED WITH GESELL, K @ 2003 [id number] (NOTE) Lowest detectable limit for serum alcohol is 10 mg/dL.  For medical purposes only. Performed at [facility], [place] [address] [place], [place] [address]    WBC 12/06/2021 4.8  4.0 - 10.5 K/uL Final   RBC 12/06/2021 4.41  4.22 - 5.81 MIL/uL Final   Hemoglobin 12/06/2021 12.1 (L)  13.0 - 17.0 g/dL Final   HCT 12/06/2021 37.9 (L)  39.0 - 52.0 % Final   MCV 12/06/2021 85.9  80.0 - 100.0 fL Final   MCH 12/06/2021 27.4  26.0 - 34.0 pg Final   MCHC 12/06/2021 31.9  30.0 - 36.0 g/dL Final   RDW 12/06/2021 17.2 (H)  11.5 - 15.5 % Final   Platelets 12/06/2021 114 (L)  150 - 400 K/uL Final   nRBC 12/06/2021 0.0  0.0 - 0.2 % Final   Performed at [facility], [place] [address] [place], [place] [address]   Magnesium 12/06/2021 2.2  1.7 - 2.4 mg/dL Final   Performed at [place] [address], [place], [place] [address]  Admission on 11/18/2021, Discharged on 11/18/2021  Component Date Value Ref Range Status   WBC  11/18/2021 4.5  4.0 - 10.5 K/uL Final   RBC 11/18/2021 4.24  4.22 - 5.81 MIL/uL Final   Hemoglobin 11/18/2021 11.7 (L)  13.0 - 17.0 g/dL Final   HCT 11/18/2021 36.1 (L)  39.0 - 52.0 % Final   MCV 11/18/2021 85.1  80.0 - 100.0 fL Final   MCH 11/18/2021 27.6  26.0 - 34.0 pg Final   MCHC 11/18/2021 32.4  30.0 - 36.0 g/dL Final   RDW 11/18/2021 16.4 (H)  11.5 - 15.5 % Final   Platelets 11/18/2021 107 (L)  150 - 400 K/uL Final   nRBC 11/18/2021 0.0  0.0 - 0.2 % Final   Neutrophils Relative % 11/18/2021 43  % Final   Neutro Abs 11/18/2021 2.0  1.7 - 7.7 K/uL Final   Lymphocytes Relative 11/18/2021 40  % Final   Lymphs Abs 11/18/2021 1.8  0.7 - 4.0 K/uL Final   Monocytes Relative 11/18/2021 12  % Final   Monocytes Absolute 11/18/2021 0.5  0.1 - 1.0 K/uL Final   Eosinophils Relative 11/18/2021 3  % Final   Eosinophils Absolute 11/18/2021 0.1  0.0 - 0.5 K/uL Final   Basophils Relative 11/18/2021 2  % Final   Basophils Absolute 11/18/2021 0.1  0.0 - 0.1 K/uL Final   Immature Granulocytes 11/18/2021 0  % Final   Abs Immature Granulocytes 11/18/2021 0.01  0.00 - 0.07 K/uL Final   Performed at [facility], [place] [address], [place], [place] [address]   Sodium 11/18/2021 140  135 - 145 mmol/L Final   Potassium 11/18/2021 3.5  3.5 - 5.1 mmol/L Final   Chloride 11/18/2021 108  98 - 111 mmol/L Final   CO2 11/18/2021 21 (L)  22 - 32 mmol/L Final   Glucose, Bld 11/18/2021 90  70 - 99 mg/dL Final   Glucose  reference range applies only to samples taken after fasting for at least 8 hours.   BUN 11/18/2021 <5 (L)  6 - 20 mg/dL Final   Creatinine, Ser 11/18/2021 0.71  0.61 - 1.24 mg/dL Final   Calcium 11/18/2021 8.4 (L)  8.9 - 10.3 mg/dL Final   Total Protein 11/18/2021 7.4  6.5 - 8.1 g/dL Final   Albumin 11/18/2021 3.7  3.5 - 5.0 g/dL Final   AST 11/18/2021 91 (H)  15 - 41 U/L Final   ALT 11/18/2021 48 (H)  0 - 44 U/L Final   Alkaline Phosphatase 11/18/2021 178 (H)  38 - 126 U/L Final   Total  Bilirubin 11/18/2021 0.5  0.3 - 1.2 mg/dL Final   GFR, Estimated 11/18/2021 >60  >60 mL/min Final   Comment: (NOTE) Calculated using the CKD-EPI Creatinine Equation (2021)    Anion gap 11/18/2021 11  5 - 15 Final   Performed at [facility], [place] [address], [place], [place] [address]   Alcohol, Ethyl (B) 11/18/2021 336 (HH)  <10 mg/dL Final   Comment: CRITICAL RESULT CALLED TO, READ BACK BY AND VERIFIED WITH DESMOND JAY,RN AT 1455 11/18/2021 BY ZBEECH. (NOTE) Lowest detectable limit for serum alcohol is 10 mg/dL.  For medical purposes only. Performed at [facility], [place] [address] [place], [place] [address]    Glucose-Capillary 11/18/2021 98  70 - 99 mg/dL Final   Glucose reference range applies only to samples taken after fasting for at least 8 hours.   Comment 1 11/18/2021 Notify RN   Final   Comment 2 11/18/2021 Document in Chart   Final  Admission on 11/16/2021, Discharged on 11/16/2021  Component Date Value Ref Range Status   WBC 11/16/2021 4.1  4.0 - 10.5 K/uL Final   RBC 11/16/2021 4.20 (L)  4.22 - 5.81 MIL/uL Final   Hemoglobin 11/16/2021 11.5 (L)  13.0 - 17.0 g/dL Final   HCT 11/16/2021 35.3 (L)  39.0 - 52.0 % Final   MCV 11/16/2021 84.0  80.0 - 100.0 fL Final   MCH 11/16/2021 27.4  26.0 - 34.0 pg Final   MCHC 11/16/2021 32.6  30.0 - 36.0 g/dL Final   RDW 11/16/2021 16.3 (H)  11.5 - 15.5 % Final   Platelets 11/16/2021 127 (L)  150 - 400 K/uL Final   nRBC 11/16/2021 0.0  0.0 - 0.2 % Final   Neutrophils Relative % 11/16/2021 38  % Final   Neutro Abs 11/16/2021 1.5 (L)  1.7 - 7.7 K/uL Final   Lymphocytes Relative 11/16/2021 45  % Final   Lymphs Abs 11/16/2021 1.9  0.7 - 4.0 K/uL Final   Monocytes Relative 11/16/2021 12  % Final   Monocytes Absolute 11/16/2021 0.5  0.1 - 1.0 K/uL Final   Eosinophils Relative 11/16/2021 3  % Final   Eosinophils Absolute 11/16/2021 0.1  0.0 - 0.5 K/uL Final   Basophils Relative 11/16/2021 2  % Final   Basophils Absolute  11/16/2021 0.1  0.0 - 0.1 K/uL Final   Immature Granulocytes 11/16/2021 0  % Final   Abs Immature Granulocytes 11/16/2021 0.01  0.00 - 0.07 K/uL Final   Performed at [facility], [place] [address], [place], [place] [address]   Sodium 11/16/2021 140  135 - 145 mmol/L Final   Potassium 11/16/2021 3.7  3.5 - 5.1 mmol/L Final   Chloride 11/16/2021 107  98 - 111 mmol/L Final   CO2 11/16/2021 22  22 - 32 mmol/L Final  Glucose, Bld 11/16/2021 93  70 - 99 mg/dL Final   Glucose reference range applies only to samples taken after fasting for at least 8 hours.   BUN 11/16/2021 <5 (L)  6 - 20 mg/dL Final   Creatinine, Ser 11/16/2021 0.77  0.61 - 1.24 mg/dL Final   Calcium 11/16/2021 8.8 (L)  8.9 - 10.3 mg/dL Final   Total Protein 11/16/2021 7.8  6.5 - 8.1 g/dL Final   Albumin 11/16/2021 4.0  3.5 - 5.0 g/dL Final   AST 11/16/2021 103 (H)  15 - 41 U/L Final   ALT 11/16/2021 52 (H)  0 - 44 U/L Final   Alkaline Phosphatase 11/16/2021 197 (H)  38 - 126 U/L Final   Total Bilirubin 11/16/2021 0.4  0.3 - 1.2 mg/dL Final   GFR, Estimated 11/16/2021 >60  >60 mL/min Final   Comment: (NOTE) Calculated using the CKD-EPI Creatinine Equation (2021)    Anion gap 11/16/2021 11  5 - 15 Final   Performed at [facility], [place] [address] [place], [place] [address]  Admission on 11/08/2021, Discharged on 11/09/2021  Component Date Value Ref Range Status   Glucose-Capillary 11/08/2021 90  70 - 99 mg/dL Final   Glucose reference range applies only to samples taken after fasting for at least 8 hours.  There may be more visits with results that are not included.    Blood Alcohol level:  Lab Results  Component Value Date   ETH 336 (HH) 01/26/2022   ETH 268 (H) 56/43/3295    Metabolic Disorder Labs: Lab Results  Component Value Date   HGBA1C 5.3 01/19/2022   MPG 105.41 01/19/2022   MPG 116.89 07/17/2021   Lab Results  Component Value Date   PROLACTIN 23.2 (H) 03/07/2016   Lab Results   Component Value Date   CHOL 251 (H) 01/19/2022   TRIG 163 (H) 01/19/2022   HDL 105 01/19/2022   CHOLHDL 2.4 01/19/2022   VLDL 33 01/19/2022   LDLCALC 113 (H) 01/19/2022   LDLCALC 66 07/17/2021    Therapeutic Lab Levels: No results found for: "LITHIUM" No results found for: "VALPROATE" No results found for: "CBMZ"  Physical Findings   AIMS    Flowsheet Row Admission (Discharged) from 07/16/2017 in [place] 300B Admission (Discharged) from 05/15/2017 in [place] 300B Admission (Discharged) from 07/06/2016 in [place] 300B Admission (Discharged) from 06/15/2016 in [place] 400B Admission (Discharged) from 03/05/2016 in [place] 500B  AIMS Total Score 0 0 0 0 0      AUDIT    Flowsheet Row Admission (Discharged) from 07/16/2021 in [place] 400B Admission (Discharged) from 07/16/2017 in [place] 300B Admission (Discharged) from 05/15/2017 in [place] 300B Admission (Discharged) from 07/06/2016 in [place] 300B Admission (Discharged) from 06/15/2016 in [place] 400B  Alcohol Use Disorder Identification Test Final Score (AUDIT) 23 40 30 36 [place] Office Visit from 02/01/2019 in [place]  Total GAD-7 Score 7      PHQ2-9    Flowsheet Row ED from 01/27/2022 in [facility] ED from 01/19/2022 in [facility] ED from 08/07/2021 in  DEPT ED from 09/04/2020 in [place]  DEPARTMENT ED from 06/17/2020 in [place] DEPT  PHQ-2 Total Score '6 2 4 4 4  '$ PHQ-9 Total Score '23 2 14 18 21       '$ Flowsheet Row ED from 01/27/2022 in [facility] ED from 01/26/2022 in [place] DEPT ED from 01/24/2022 in [place] DEPT  C-SSRS RISK CATEGORY No Risk High Risk High Risk        Musculoskeletal  Strength & Muscle Tone: within normal limits Gait & Station: normal Patient leans: N/A  Psychiatric Specialty Exam  Presentation  General Appearance:  Appropriate for Environment  Eye Contact: Fair  Speech: Clear and Coherent; Normal Rate  Speech Volume: Normal  Handedness: Right   Mood and Affect  Mood: Euthymic  Affect: Blunt   Thought Process  Thought Processes: Coherent  Descriptions of Associations:Intact  Orientation:Full (Time, Place and Person)  Thought Content:Logical  Diagnosis of Schizophrenia or Schizoaffective disorder in past: No    Hallucinations:Hallucinations: None  Ideas of Reference:None  Suicidal Thoughts:Suicidal Thoughts: No  Homicidal Thoughts:Homicidal Thoughts: No   Sensorium  Memory: Immediate Fair  Judgment: Intact  Insight: Fair   Materials engineer: Fair  Attention Span: Fair  Recall: [employer] of Knowledge: Fair  Language: Fair   Psychomotor Activity  Psychomotor Activity: Psychomotor Activity: Normal   Assets  Assets: Desire for Improvement; Communication Skills; Financial Resources/Insurance; Resilience   Sleep  Sleep: Sleep: Fair   No data recorded  Physical Exam  Physical Exam Vitals and nursing note reviewed.  Cardiovascular:     Rate and Rhythm: Normal rate and regular rhythm.     Pulses: Normal pulses.     Heart sounds: Normal heart sounds.  Neurological:     General: No focal deficit present.     Mental Status: He is alert and oriented to person, place, and time.  Psychiatric:        Mood and Affect: Mood normal.        Behavior: Behavior normal.        Thought  Content: Thought content normal.    Review of Systems  HENT: Negative.    Eyes: Negative.   Cardiovascular: Negative.   Gastrointestinal: Negative.   Musculoskeletal: Negative.   Endo/Heme/Allergies: Negative.   Psychiatric/Behavioral:  Positive for depression and substance abuse. The patient is nervous/anxious and has insomnia.   All other systems reviewed and are negative.  Blood pressure (!) 156/63, pulse 78, temperature 98.6 F (37 C), temperature source Oral, resp. rate 18, SpO2 100 %. There is no height or weight on file to calculate BMI.  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 3 times daily -Initiated Remeron 15 mg p.o. nightly as needed  CSW to continue working on discharge disposition Patient encouraged to participate in therapeutic milieu   Derrill Center, NP 01/29/2022 10:47 AM

## 2022-01-29 NOTE — ED Notes (Signed)
Pt asleep in bed. Respirations even and unlabored. Monitoring for safety. 

## 2022-01-30 DIAGNOSIS — F332 Major depressive disorder, recurrent severe without psychotic features: Secondary | ICD-10-CM | POA: Diagnosis not present

## 2022-01-30 DIAGNOSIS — F1010 Alcohol abuse, uncomplicated: Secondary | ICD-10-CM | POA: Diagnosis not present

## 2022-01-30 DIAGNOSIS — D509 Iron deficiency anemia, unspecified: Secondary | ICD-10-CM | POA: Diagnosis not present

## 2022-01-30 DIAGNOSIS — K709 Alcoholic liver disease, unspecified: Secondary | ICD-10-CM | POA: Diagnosis not present

## 2022-01-30 NOTE — ED Notes (Signed)
Patient is awake and alert on unit.  Calm and pleasant on approach.  No evidence of withdrawal at this time.  Patient appears motivated for treatment.  He makes needs known and is without distress or complaint.  He is presently sitting in dayroom watching tv with peers.  Will monitor and provide a safe environment.

## 2022-01-30 NOTE — ED Notes (Signed)
Patient continues to rest with no sxs of distress noted - will continue to monitor for safety

## 2022-01-30 NOTE — ED Notes (Signed)
Pt sitting in dining room watching TV. A&O x4, calm and cooperative. Denies current SI/HI/AVH. No signs of distress noted. Monitoring for safety.

## 2022-01-30 NOTE — Progress Notes (Signed)
Patient is currently resting in bed without issue or complaint.  No evidence of withdrawal or somatic concern.  Patient is calm, pleasant organized and logical.  Patient makes needs known and denies avh shi or plan.  Will monitor and provide a safe envrironment.

## 2022-01-30 NOTE — ED Notes (Signed)
Pt asleep in bed. Respirations even and unlabored. Monitoring for safety. 

## 2022-01-30 NOTE — ED Provider Notes (Cosign Needed Addendum)
Behavioral Health Progress Note  Date and Time: 01/30/2022 9:47 AM Name: Barry Moore MRN:  [id number]  Subjective:   Barry Moore is a 56 y.o. male who presented to [facility] on 11/8 with initial complaints of SI/HI but reporting significant EtOH abuse, he was admitted to [facility] on 11/9 for Detox and Residential Placement.  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  He reports that his doing okay today.  He reports he is not having any cravings for alcohol that he is having dreams of drinking.  He reports he is having some withdrawal shakes but that they are much better than yesterday and some dizziness.  He reports no other withdrawal symptoms.  He reports tolerating starting Remeron yesterday.  He reports that he slept much better last night.  He reports no side effects from his medications.  He reports that he is still interested in residential treatment.  He reports that [facility]  He reports the social worker talked with him about finding placement on Monday.  He reports no SI, HI, or AVH.  He reports sleep is good.  He reports his appetite has been good.  He reports no other concerns about   Diagnosis:  Final diagnoses:  Alcohol abuse  Homelessness  Depressed affect    Total Time spent with patient: 20 minutes  Past Psychiatric History: Depression and EtOH Abuse complicated by pancreatitis, GI bleed, thrombocytopenia, hypertension, homelessness. Past Medical History:  Past Medical History:  Diagnosis Date   Alcoholic hepatitis    Depression    ETOH abuse    Gastric bleed 08/2018   Hypertension    Pancreatitis    Seizures ([place])    Suicidal behavior     Past Surgical History:  Procedure Laterality Date   BIOPSY  08/05/2018   Procedure: BIOPSY;  Surgeon: Irving Copas., MD;  Location: [facility] ENDOSCOPY;  Service:  Gastroenterology;;   BIOPSY  08/07/2018   Procedure: BIOPSY;  Surgeon: Thornton Park, MD;  Location: [facility] ENDOSCOPY;  Service: Gastroenterology;;   BIOPSY  01/02/2019   Procedure: BIOPSY;  Surgeon: Gatha Mayer, MD;  Location: [facility] ENDOSCOPY;  Service: Endoscopy;;   COLONOSCOPY WITH PROPOFOL N/A 08/07/2018   Procedure: COLONOSCOPY WITH PROPOFOL;  Surgeon: Thornton Park, MD;  Location: [place];  Service: Gastroenterology;  Laterality: N/A;   ESOPHAGOGASTRODUODENOSCOPY N/A 01/02/2019   Procedure: ESOPHAGOGASTRODUODENOSCOPY (EGD);  Surgeon: Gatha Mayer, MD;  Location: [facility] ENDOSCOPY;  Service: Endoscopy;  Laterality: N/A;   ESOPHAGOGASTRODUODENOSCOPY N/A 07/03/2019   Procedure: ESOPHAGOGASTRODUODENOSCOPY (EGD);  Surgeon: Wonda Horner, MD;  Location: [facility] ENDOSCOPY;  Service: Endoscopy;  Laterality: N/A;   ESOPHAGOGASTRODUODENOSCOPY N/A 06/26/2020   Procedure: ESOPHAGOGASTRODUODENOSCOPY (EGD);  Surgeon: Arta Silence, MD;  Location: [facility] ENDOSCOPY;  Service: Endoscopy;  Laterality: N/A;   ESOPHAGOGASTRODUODENOSCOPY (EGD) WITH PROPOFOL N/A 11/02/2016   Procedure: ESOPHAGOGASTRODUODENOSCOPY (EGD) WITH PROPOFOL;  Surgeon: Carol Ada, MD;  Location: [facility] ENDOSCOPY;  Service: Endoscopy;  Laterality: N/A;   ESOPHAGOGASTRODUODENOSCOPY (EGD) WITH PROPOFOL N/A 08/05/2018   Procedure: ESOPHAGOGASTRODUODENOSCOPY (EGD) WITH PROPOFOL;  Surgeon: Rush Landmark Telford Nab., MD;  Location: [place];  Service: Gastroenterology;  Laterality: N/A;   ESOPHAGOGASTRODUODENOSCOPY (EGD) WITH PROPOFOL N/A 07/14/2019   Procedure: ESOPHAGOGASTRODUODENOSCOPY (EGD) WITH PROPOFOL;  Surgeon: Otis Brace, MD;  Location: [facility] ENDOSCOPY;  Service: Gastroenterology;  Laterality: N/A;   HERNIA REPAIR     LEG SURGERY     POLYPECTOMY  08/07/2018  Procedure: POLYPECTOMY;  Surgeon: Thornton Park, MD;  Location: [facility] ENDOSCOPY;  Service: Gastroenterology;;   Family History:  Family History  Problem Relation Age of Onset    Diabetes Mother    Alcoholism Mother    Emphysema Father    Lung cancer Father    Alcoholism Father    Family Psychiatric  History: Father- EtOH Abuse Mother- EtOH Abuse Social History:  Social History   Substance and Sexual Activity  Alcohol Use Yes   Comment: 5+ BEERS DAILY, Patient does not know how much he drank tonight     Social History   Substance and Sexual Activity  Drug Use Not Currently   Frequency: 3.0 times per week   Types: Cocaine    Social History   Socioeconomic History   Marital status: Divorced    Spouse name: Not on file   Number of children: Not on file   Years of education: Not on file   Highest education level: Not on file  Occupational History   Not on file  Tobacco Use   Smoking status: Every Day    Packs/day: 1.00    Types: Cigarettes   Smokeless tobacco: Never  Vaping Use   Vaping Use: Never used  Substance and Sexual Activity   Alcohol use: Yes    Comment: 5+ BEERS DAILY, Patient does not know how much he drank tonight   Drug use: Not Currently    Frequency: 3.0 times per week    Types: Cocaine   Sexual activity: Not Currently  Other Topics Concern   Not on file  Social History Narrative   Not on file   Social Determinants of Health   Financial Resource Strain: Not on file  Food Insecurity: Not on file  Transportation Needs: Not on file  Physical Activity: Not on file  Stress: Not on file  Social Connections: Not on file   SDOH:  SDOH Screenings   Alcohol Screen: High Risk (07/16/2021)  Depression (PHQ2-9): High Risk (01/28/2022)  Tobacco Use: High Risk (01/26/2022)   Additional Social History:                         Sleep: Good  Appetite:  Good  Current Medications:  Current Facility-Administered Medications  Medication Dose Route Frequency Provider Last Rate Last Admin   acetaminophen (TYLENOL) tablet 650 mg  650 mg Oral Q6H PRN Evette Georges, NP       alum & mag hydroxide-simeth (MAALOX/MYLANTA)  200-200-20 MG/5ML suspension 30 mL  30 mL Oral Q4H PRN Evette Georges, NP       citalopram (CELEXA) tablet 10 mg  10 mg Oral Daily Evette Georges, NP   10 mg at 01/30/22 9983   gabapentin (NEURONTIN) capsule 300 mg  300 mg Oral TID Evette Georges, NP   300 mg at 01/30/22 3825   hydrOXYzine (ATARAX) tablet 25 mg  25 mg Oral Q6H PRN Evette Georges, NP   25 mg at 01/29/22 2111   loperamide (IMODIUM) capsule 2-4 mg  2-4 mg Oral PRN Evette Georges, NP       LORazepam (ATIVAN) tablet 1 mg  1 mg Oral Q6H PRN Evette Georges, NP       magnesium hydroxide (MILK OF MAGNESIA) suspension 30 mL  30 mL Oral Daily PRN Evette Georges, NP       mirtazapine (REMERON) tablet 15 mg  15 mg Oral QHS Derrill Center, NP   15 mg at 01/29/22 2111  multivitamin with minerals tablet 1 tablet  1 tablet Oral Daily Evette Georges, NP   1 tablet at 01/30/22 0908   ondansetron (ZOFRAN-ODT) disintegrating tablet 4 mg  4 mg Oral Q6H PRN Evette Georges, NP       thiamine (VITAMIN B1) tablet 100 mg  100 mg Oral Daily Evette Georges, NP   100 mg at 01/30/22 0908   Current Outpatient Medications  Medication Sig Dispense Refill   citalopram (CELEXA) 10 MG tablet Take 1 tablet (10 mg total) by mouth daily. (Patient not taking: Reported on 01/26/2022) 7 tablet 0   gabapentin (NEURONTIN) 300 MG capsule Take 1 capsule (300 mg total) by mouth 3 (three) times daily. (Patient not taking: Reported on 01/26/2022) 90 capsule 0   Multiple Vitamin (MULTIVITAMIN WITH MINERALS) TABS tablet Take 1 tablet by mouth daily. (Patient not taking: Reported on 01/26/2022) 30 tablet 0   thiamine (VITAMIN B-1) 100 MG tablet Take 1 tablet (100 mg total) by mouth daily. 30 tablet 0    Labs  Lab Results:  Admission on 01/26/2022, Discharged on 01/27/2022  Component Date Value Ref Range Status   WBC 01/26/2022 5.8  4.0 - 10.5 K/uL Final   RBC 01/26/2022 4.05 (L)  4.22 - 5.81 MIL/uL Final   Hemoglobin 01/26/2022 10.9 (L)  13.0 - 17.0 g/dL Final   HCT 01/26/2022 34.5 (L)   39.0 - 52.0 % Final   MCV 01/26/2022 85.2  80.0 - 100.0 fL Final   MCH 01/26/2022 26.9  26.0 - 34.0 pg Final   MCHC 01/26/2022 31.6  30.0 - 36.0 g/dL Final   RDW 01/26/2022 16.0 (H)  11.5 - 15.5 % Final   Platelets 01/26/2022 192  150 - 400 K/uL Final   nRBC 01/26/2022 0.0  0.0 - 0.2 % Final   Neutrophils Relative % 01/26/2022 44  % Final   Neutro Abs 01/26/2022 2.5  1.7 - 7.7 K/uL Final   Lymphocytes Relative 01/26/2022 38  % Final   Lymphs Abs 01/26/2022 2.2  0.7 - 4.0 K/uL Final   Monocytes Relative 01/26/2022 15  % Final   Monocytes Absolute 01/26/2022 0.9  0.1 - 1.0 K/uL Final   Eosinophils Relative 01/26/2022 2  % Final   Eosinophils Absolute 01/26/2022 0.1  0.0 - 0.5 K/uL Final   Basophils Relative 01/26/2022 1  % Final   Basophils Absolute 01/26/2022 0.1  0.0 - 0.1 K/uL Final   Immature Granulocytes 01/26/2022 0  % Final   Abs Immature Granulocytes 01/26/2022 0.02  0.00 - 0.07 K/uL Final   Performed at [facility], [place] [address] [place], [place] [address]   Sodium 01/26/2022 136  135 - 145 mmol/L Final   Potassium 01/26/2022 3.7  3.5 - 5.1 mmol/L Final   Chloride 01/26/2022 105  98 - 111 mmol/L Final   CO2 01/26/2022 21 (L)  22 - 32 mmol/L Final   Glucose, Bld 01/26/2022 109 (H)  70 - 99 mg/dL Final   Glucose reference range applies only to samples taken after fasting for at least 8 hours.   BUN 01/26/2022 9  6 - 20 mg/dL Final   Creatinine, Ser 01/26/2022 0.59 (L)  0.61 - 1.24 mg/dL Final   Calcium 01/26/2022 9.5  8.9 - 10.3 mg/dL Final   Total Protein 01/26/2022 8.3 (H)  6.5 - 8.1 g/dL Final   Albumin 01/26/2022 4.2  3.5 - 5.0 g/dL Final   AST 01/26/2022 153 (H)  15 - 41 U/L Final   ALT  01/26/2022 90 (H)  0 - 44 U/L Final   Alkaline Phosphatase 01/26/2022 133 (H)  38 - 126 U/L Final   Total Bilirubin 01/26/2022 0.5  0.3 - 1.2 mg/dL Final   GFR, Estimated 01/26/2022 >60  >60 mL/min Final   Comment: (NOTE) Calculated using the CKD-EPI Creatinine  Equation (2021)    Anion gap 01/26/2022 10  5 - 15 Final   Performed at [facility], [place] [address], [place], [place] [address]   Magnesium 01/26/2022 2.5 (H)  1.7 - 2.4 mg/dL Final   Performed at [place] [address], [place], [place] [address]   Alcohol, Ethyl (B) 01/26/2022 336 (HH)  <10 mg/dL Final   Comment: CRITICAL RESULT CALLED TO, READ BACK BY AND VERIFIED WITH ROHR,A AT 2128 ON 01/26/22 BY VAZQUEZJ (NOTE) Lowest detectable limit for serum alcohol is 10 mg/dL.  For medical purposes only. Performed at [facility], [place] [address], [place], [place] [address]    Opiates 01/27/2022 NONE DETECTED  NONE DETECTED Final   Cocaine 01/27/2022 NONE DETECTED  NONE DETECTED Final   Benzodiazepines 01/27/2022 POSITIVE (A)  NONE DETECTED Final   Amphetamines 01/27/2022 NONE DETECTED  NONE DETECTED Final   Tetrahydrocannabinol 01/27/2022 NONE DETECTED  NONE DETECTED Final   Barbiturates 01/27/2022 NONE DETECTED  NONE DETECTED Final   Comment: (NOTE) DRUG SCREEN FOR MEDICAL PURPOSES ONLY.  IF CONFIRMATION IS NEEDED FOR ANY PURPOSE, NOTIFY LAB WITHIN 5 DAYS.  LOWEST DETECTABLE LIMITS FOR URINE DRUG SCREEN Drug Class                     Cutoff (ng/mL) Amphetamine and metabolites    1000 Barbiturate and metabolites    200 Benzodiazepine                 200 Opiates and metabolites        300 Cocaine and metabolites        300 THC                            50 Performed at [facility], [place] [address] [place], [place] [address]    Color, Urine 01/27/2022 YELLOW  YELLOW Final   APPearance 01/27/2022 CLEAR  CLEAR Final   Specific Gravity, Urine 01/27/2022 1.024  1.005 - 1.030 Final   pH 01/27/2022 5.0  5.0 - 8.0 Final   Glucose, UA 01/27/2022 NEGATIVE  NEGATIVE mg/dL Final   Hgb urine dipstick 01/27/2022 NEGATIVE  NEGATIVE Final   Bilirubin Urine 01/27/2022 NEGATIVE  NEGATIVE Final   Ketones, ur  01/27/2022 NEGATIVE  NEGATIVE mg/dL Final   Protein, ur 01/27/2022 NEGATIVE  NEGATIVE mg/dL Final   Nitrite 01/27/2022 NEGATIVE  NEGATIVE Final   Leukocytes,Ua 01/27/2022 TRACE (A)  NEGATIVE Final   RBC / HPF 01/27/2022 0-5  0 - 5 RBC/hpf Final   WBC, UA 01/27/2022 6-10  0 - 5 WBC/hpf Final   Bacteria, UA 01/27/2022 NONE SEEN  NONE SEEN Final   Squamous Epithelial / LPF 01/27/2022 0-5  0 - 5 Final   Mucus 01/27/2022 PRESENT   Final   Performed at [facility], [place] [address] [place],  [address]   SARS Coronavirus 2 by RT PCR 01/27/2022 NEGATIVE  NEGATIVE Final   Comment: (NOTE) SARS-CoV-2 target nucleic acids are NOT DETECTED.  The SARS-CoV-2 RNA is generally detectable in upper respiratory specimens during the acute phase  of infection. The lowest concentration of SARS-CoV-2 viral copies this assay can detect is 138 copies/mL. A negative result does not preclude SARS-Cov-2 infection and should not be used as the sole basis for treatment or other patient management decisions. A negative result may occur with  improper specimen collection/handling, submission of specimen other than nasopharyngeal swab, presence of viral mutation(s) within the areas targeted by this assay, and inadequate number of viral copies(<138 copies/mL). A negative result must be combined with clinical observations, patient history, and epidemiological information. The expected result is Negative.  Fact Sheet for Patients:  EntrepreneurPulse.com.au  Fact Sheet for Healthcare Providers:  IncredibleEmployment.be  This test is no                          t yet approved or cleared by the [place] FDA and  has been authorized for detection and/or diagnosis of SARS-CoV-2 by FDA under an Emergency Use Authorization (EUA). This EUA will remain  in effect (meaning this test can be used) for the duration of the COVID-19 declaration under Section  564(b)(1) of the Act, 21 U.S.C.section 360bbb-3(b)(1), unless the authorization is terminated  or revoked sooner.       Influenza A by PCR 01/27/2022 NEGATIVE  NEGATIVE Final   Influenza B by PCR 01/27/2022 NEGATIVE  NEGATIVE Final   Comment: (NOTE) The Xpert Xpress SARS-CoV-2/FLU/RSV plus assay is intended as an aid in the diagnosis of influenza from Nasopharyngeal swab specimens and should not be used as a sole basis for treatment. Nasal washings and aspirates are unacceptable for Xpert Xpress SARS-CoV-2/FLU/RSV testing.  Fact Sheet for Patients: EntrepreneurPulse.com.au  Fact Sheet for Healthcare Providers: IncredibleEmployment.be  This test is not yet approved or cleared by the [place] FDA and has been authorized for detection and/or diagnosis of SARS-CoV-2 by FDA under an Emergency Use Authorization (EUA). This EUA will remain in effect (meaning this test can be used) for the duration of the COVID-19 declaration under Section 564(b)(1) of the Act, 21 U.S.C. section 360bbb-3(b)(1), unless the authorization is terminated or revoked.  Performed at [facility], [place] [address], [place], [place] [address]   Admission on 01/24/2022, Discharged on 01/26/2022  Component Date Value Ref Range Status   Sodium 01/24/2022 139  135 - 145 mmol/L Final   Potassium 01/24/2022 3.8  3.5 - 5.1 mmol/L Final   Chloride 01/24/2022 107  98 - 111 mmol/L Final   CO2 01/24/2022 24  22 - 32 mmol/L Final   Glucose, Bld 01/24/2022 95  70 - 99 mg/dL Final   Glucose reference range applies only to samples taken after fasting for at least 8 hours.   BUN 01/24/2022 10  6 - 20 mg/dL Final   Creatinine, Ser 01/24/2022 0.79  0.61 - 1.24 mg/dL Final   Calcium 01/24/2022 8.4 (L)  8.9 - 10.3 mg/dL Final   Total Protein 01/24/2022 7.5  6.5 - 8.1 g/dL Final   Albumin 01/24/2022 3.8  3.5 - 5.0 g/dL Final   AST 01/24/2022 127 (H)  15 - 41 U/L Final    ALT 01/24/2022 78 (H)  0 - 44 U/L Final   Alkaline Phosphatase 01/24/2022 142 (H)  38 - 126 U/L Final   Total Bilirubin 01/24/2022 0.4  0.3 - 1.2 mg/dL Final   GFR, Estimated 01/24/2022 >60  >60 mL/min Final   Comment: (NOTE) Calculated using the CKD-EPI Creatinine Equation (2021)    Anion gap 01/24/2022 8  5 -  15 Final   Performed at [facility], [place] [address] [place], [place] [address]   Alcohol, Ethyl (B) 01/24/2022 268 (H)  <10 mg/dL Final   Comment: (NOTE) Lowest detectable limit for serum alcohol is 10 mg/dL.  For medical purposes only. Performed at [facility], [place] [address], [place], [place] [address]    Opiates 01/25/2022 NONE DETECTED  NONE DETECTED Final   Cocaine 01/25/2022 NONE DETECTED  NONE DETECTED Final   Benzodiazepines 01/25/2022 NONE DETECTED  NONE DETECTED Final   Amphetamines 01/25/2022 NONE DETECTED  NONE DETECTED Final   Tetrahydrocannabinol 01/25/2022 NONE DETECTED  NONE DETECTED Final   Barbiturates 01/25/2022 NONE DETECTED  NONE DETECTED Final   Comment: (NOTE) DRUG SCREEN FOR MEDICAL PURPOSES ONLY.  IF CONFIRMATION IS NEEDED FOR ANY PURPOSE, NOTIFY LAB WITHIN 5 DAYS.  LOWEST DETECTABLE LIMITS FOR URINE DRUG SCREEN Drug Class                     Cutoff (ng/mL) Amphetamine and metabolites    1000 Barbiturate and metabolites    200 Benzodiazepine                 200 Opiates and metabolites        300 Cocaine and metabolites        300 THC                            50 Performed at [facility], [place] [address] [place], [place] [address]    WBC 01/24/2022 4.8  4.0 - 10.5 K/uL Final   RBC 01/24/2022 3.48 (L)  4.22 - 5.81 MIL/uL Final   Hemoglobin 01/24/2022 9.5 (L)  13.0 - 17.0 g/dL Final   HCT 01/24/2022 30.0 (L)  39.0 - 52.0 % Final   MCV 01/24/2022 86.2  80.0 - 100.0 fL Final   MCH 01/24/2022 27.3  26.0 - 34.0 pg Final   MCHC 01/24/2022 31.7  30.0 - 36.0 g/dL Final   RDW 01/24/2022  16.6 (H)  11.5 - 15.5 % Final   Platelets 01/24/2022 165  150 - 400 K/uL Final   nRBC 01/24/2022 0.0  0.0 - 0.2 % Final   Neutrophils Relative % 01/24/2022 33  % Final   Neutro Abs 01/24/2022 1.6 (L)  1.7 - 7.7 K/uL Final   Lymphocytes Relative 01/24/2022 47  % Final   Lymphs Abs 01/24/2022 2.3  0.7 - 4.0 K/uL Final   Monocytes Relative 01/24/2022 15  % Final   Monocytes Absolute 01/24/2022 0.7  0.1 - 1.0 K/uL Final   Eosinophils Relative 01/24/2022 3  % Final   Eosinophils Absolute 01/24/2022 0.2  0.0 - 0.5 K/uL Final   Basophils Relative 01/24/2022 2  % Final   Basophils Absolute 01/24/2022 0.1  0.0 - 0.1 K/uL Final   Immature Granulocytes 01/24/2022 0  % Final   Abs Immature Granulocytes 01/24/2022 0.00  0.00 - 0.07 K/uL Final   Performed at [facility], [place] [address], [place], [place] [address]   WBC 01/25/2022 4.6  4.0 - 10.5 K/uL Final   RBC 01/25/2022 4.00 (L)  4.22 - 5.81 MIL/uL Final   Hemoglobin 01/25/2022 10.6 (L)  13.0 - 17.0 g/dL Final   HCT 01/25/2022 34.3 (L)  39.0 - 52.0 % Final   MCV 01/25/2022 85.8  80.0 - 100.0 fL Final   MCH 01/25/2022 26.5  26.0 - 34.0 pg  Final   MCHC 01/25/2022 30.9  30.0 - 36.0 g/dL Final   RDW 01/25/2022 16.5 (H)  11.5 - 15.5 % Final   Platelets 01/25/2022 199  150 - 400 K/uL Final   nRBC 01/25/2022 0.0  0.0 - 0.2 % Final   Performed at [facility], [place] [address] [place], [place] [address]  Admission on 01/22/2022, Discharged on 01/23/2022  Component Date Value Ref Range Status   Sodium 01/22/2022 139  135 - 145 mmol/L Final   Potassium 01/22/2022 4.2  3.5 - 5.1 mmol/L Final   Chloride 01/22/2022 102  98 - 111 mmol/L Final   CO2 01/22/2022 21 (L)  22 - 32 mmol/L Final   Glucose, Bld 01/22/2022 105 (H)  70 - 99 mg/dL Final   Glucose reference range applies only to samples taken after fasting for at least 8 hours.   BUN 01/22/2022 8  6 - 20 mg/dL Final   Creatinine, Ser 01/22/2022 0.72  0.61 - 1.24  mg/dL Final   Calcium 01/22/2022 9.7  8.9 - 10.3 mg/dL Final   Total Protein 01/22/2022 8.0  6.5 - 8.1 g/dL Final   Albumin 01/22/2022 4.1  3.5 - 5.0 g/dL Final   AST 01/22/2022 216 (H)  15 - 41 U/L Final   ALT 01/22/2022 115 (H)  0 - 44 U/L Final   Alkaline Phosphatase 01/22/2022 186 (H)  38 - 126 U/L Final   Total Bilirubin 01/22/2022 0.5  0.3 - 1.2 mg/dL Final   GFR, Estimated 01/22/2022 >60  >60 mL/min Final   Comment: (NOTE) Calculated using the CKD-EPI Creatinine Equation (2021)    Anion gap 01/22/2022 16 (H)  5 - 15 Final   Performed at [place] [address], [place], [place] [address]   WBC 01/22/2022 5.9  4.0 - 10.5 K/uL Final   RBC 01/22/2022 4.09 (L)  4.22 - 5.81 MIL/uL Final   Hemoglobin 01/22/2022 10.9 (L)  13.0 - 17.0 g/dL Final   HCT 01/22/2022 35.1 (L)  39.0 - 52.0 % Final   MCV 01/22/2022 85.8  80.0 - 100.0 fL Final   MCH 01/22/2022 26.7  26.0 - 34.0 pg Final   MCHC 01/22/2022 31.1  30.0 - 36.0 g/dL Final   RDW 01/22/2022 16.5 (H)  11.5 - 15.5 % Final   Platelets 01/22/2022 192  150 - 400 K/uL Final   nRBC 01/22/2022 0.0  0.0 - 0.2 % Final   Neutrophils Relative % 01/22/2022 43  % Final   Neutro Abs 01/22/2022 2.5  1.7 - 7.7 K/uL Final   Lymphocytes Relative 01/22/2022 38  % Final   Lymphs Abs 01/22/2022 2.2  0.7 - 4.0 K/uL Final   Monocytes Relative 01/22/2022 16  % Final   Monocytes Absolute 01/22/2022 0.9  0.1 - 1.0 K/uL Final   Eosinophils Relative 01/22/2022 2  % Final   Eosinophils Absolute 01/22/2022 0.1  0.0 - 0.5 K/uL Final   Basophils Relative 01/22/2022 1  % Final   Basophils Absolute 01/22/2022 0.1  0.0 - 0.1 K/uL Final   Immature Granulocytes 01/22/2022 0  % Final   Abs Immature Granulocytes 01/22/2022 0.02  0.00 - 0.07 K/uL Final   Performed at [facility], [place] [address] [place], [place] [address]   Alcohol, Ethyl (B) 01/22/2022 340 (HH)  <10 mg/dL Final   Comment: CRITICAL RESULT CALLED TO, READ BACK BY AND VERIFIED WITH Assunta Found, RN, 2232 01/22/22, A. RAMSEY (NOTE) Lowest detectable limit  for serum alcohol is 10 mg/dL.  For medical purposes only. Performed at [facility], [place] [address], [place], [place] [address]    Opiates 01/22/2022 NONE DETECTED  NONE DETECTED Final   Cocaine 01/22/2022 NONE DETECTED  NONE DETECTED Final   Benzodiazepines 01/22/2022 NONE DETECTED  NONE DETECTED Final   Amphetamines 01/22/2022 NONE DETECTED  NONE DETECTED Final   Tetrahydrocannabinol 01/22/2022 NONE DETECTED  NONE DETECTED Final   Barbiturates 01/22/2022 NONE DETECTED  NONE DETECTED Final   Comment: (NOTE) DRUG SCREEN FOR MEDICAL PURPOSES ONLY.  IF CONFIRMATION IS NEEDED FOR ANY PURPOSE, NOTIFY LAB WITHIN 5 DAYS.  LOWEST DETECTABLE LIMITS FOR URINE DRUG SCREEN Drug Class                     Cutoff (ng/mL) Amphetamine and metabolites    1000 Barbiturate and metabolites    200 Benzodiazepine                 200 Opiates and metabolites        300 Cocaine and metabolites        300 THC                            50 Performed at [facility], [place] [address], [place], [place] [address]    Color, Urine 01/22/2022 COLORLESS (A)  YELLOW Final   APPearance 01/22/2022 CLEAR  CLEAR Final   Specific Gravity, Urine 01/22/2022 1.002 (L)  1.005 - 1.030 Final   pH 01/22/2022 5.0  5.0 - 8.0 Final   Glucose, UA 01/22/2022 NEGATIVE  NEGATIVE mg/dL Final   Hgb urine dipstick 01/22/2022 NEGATIVE  NEGATIVE Final   Bilirubin Urine 01/22/2022 NEGATIVE  NEGATIVE Final   Ketones, ur 01/22/2022 NEGATIVE  NEGATIVE mg/dL Final   Protein, ur 01/22/2022 NEGATIVE  NEGATIVE mg/dL Final   Nitrite 01/22/2022 NEGATIVE  NEGATIVE Final   Leukocytes,Ua 01/22/2022 NEGATIVE  NEGATIVE Final   Performed at [place] [address], [place], [place] [address]  Admission on 01/19/2022, Discharged on 01/21/2022  Component Date Value Ref Range Status   SARS Coronavirus 2 by RT PCR 01/19/2022 NEGATIVE  NEGATIVE Final   Comment:  (NOTE) SARS-CoV-2 target nucleic acids are NOT DETECTED.  The SARS-CoV-2 RNA is generally detectable in upper respiratory specimens during the acute phase of infection. The lowest concentration of SARS-CoV-2 viral copies this assay can detect is 138 copies/mL. A negative result does not preclude SARS-Cov-2 infection and should not be used as the sole basis for treatment or other patient management decisions. A negative result may occur with  improper specimen collection/handling, submission of specimen other than nasopharyngeal swab, presence of viral mutation(s) within the areas targeted by this assay, and inadequate number of viral copies(<138 copies/mL). A negative result must be combined with clinical observations, patient history, and epidemiological information. The expected result is Negative.  Fact Sheet for Patients:  EntrepreneurPulse.com.au  Fact Sheet for Healthcare Providers:  IncredibleEmployment.be  This test is no                          t yet approved or cleared by the [place] FDA and  has been authorized for detection and/or diagnosis of SARS-CoV-2 by FDA under an Emergency Use Authorization (EUA). This EUA will remain  in effect (meaning this test can be used) for the duration of the COVID-19 declaration  under Section 564(b)(1) of the Act, 21 U.S.C.section 360bbb-3(b)(1), unless the authorization is terminated  or revoked sooner.       Influenza A by PCR 01/19/2022 NEGATIVE  NEGATIVE Final   Influenza B by PCR 01/19/2022 NEGATIVE  NEGATIVE Final   Comment: (NOTE) The Xpert Xpress SARS-CoV-2/FLU/RSV plus assay is intended as an aid in the diagnosis of influenza from Nasopharyngeal swab specimens and should not be used as a sole basis for treatment. Nasal washings and aspirates are unacceptable for Xpert Xpress SARS-CoV-2/FLU/RSV testing.  Fact Sheet for Patients: EntrepreneurPulse.com.au  Fact  Sheet for Healthcare Providers: IncredibleEmployment.be  This test is not yet approved or cleared by the [place] FDA and has been authorized for detection and/or diagnosis of SARS-CoV-2 by FDA under an Emergency Use Authorization (EUA). This EUA will remain in effect (meaning this test can be used) for the duration of the COVID-19 declaration under Section 564(b)(1) of the Act, 21 U.S.C. section 360bbb-3(b)(1), unless the authorization is terminated or revoked.  Performed at [facility], [place] [address] [place], [place] [address]    WBC 01/19/2022 4.8  4.0 - 10.5 K/uL Final   RBC 01/19/2022 3.95 (L)  4.22 - 5.81 MIL/uL Final   Hemoglobin 01/19/2022 10.8 (L)  13.0 - 17.0 g/dL Final   HCT 01/19/2022 33.2 (L)  39.0 - 52.0 % Final   MCV 01/19/2022 84.1  80.0 - 100.0 fL Final   MCH 01/19/2022 27.3  26.0 - 34.0 pg Final   MCHC 01/19/2022 32.5  30.0 - 36.0 g/dL Final   RDW 01/19/2022 16.7 (H)  11.5 - 15.5 % Final   Platelets 01/19/2022 161  150 - 400 K/uL Final   nRBC 01/19/2022 0.0  0.0 - 0.2 % Final   Neutrophils Relative % 01/19/2022 40  % Final   Neutro Abs 01/19/2022 2.0  1.7 - 7.7 K/uL Final   Lymphocytes Relative 01/19/2022 38  % Final   Lymphs Abs 01/19/2022 1.8  0.7 - 4.0 K/uL Final   Monocytes Relative 01/19/2022 17  % Final   Monocytes Absolute 01/19/2022 0.8  0.1 - 1.0 K/uL Final   Eosinophils Relative 01/19/2022 3  % Final   Eosinophils Absolute 01/19/2022 0.1  0.0 - 0.5 K/uL Final   Basophils Relative 01/19/2022 2  % Final   Basophils Absolute 01/19/2022 0.1  0.0 - 0.1 K/uL Final   Immature Granulocytes 01/19/2022 0  % Final   Abs Immature Granulocytes 01/19/2022 0.02  0.00 - 0.07 K/uL Final   Performed at [facility], [place] [address], [place], [place] [address]   Sodium 01/19/2022 138  135 - 145 mmol/L Final   Potassium 01/19/2022 3.8  3.5 - 5.1 mmol/L Final   Chloride 01/19/2022 102  98 - 111 mmol/L Final   CO2 01/19/2022 22  22 -  32 mmol/L Final   Glucose, Bld 01/19/2022 89  70 - 99 mg/dL Final   Glucose reference range applies only to samples taken after fasting for at least 8 hours.   BUN 01/19/2022 7  6 - 20 mg/dL Final   Creatinine, Ser 01/19/2022 0.64  0.61 - 1.24 mg/dL Final   Calcium 01/19/2022 9.2  8.9 - 10.3 mg/dL Final   Total Protein 01/19/2022 8.1  6.5 - 8.1 g/dL Final   Albumin 01/19/2022 4.0  3.5 - 5.0 g/dL Final   AST 01/19/2022 140 (H)  15 - 41 U/L Final   ALT 01/19/2022 68 (H)  0 - 44 U/L Final  Alkaline Phosphatase 01/19/2022 144 (H)  38 - 126 U/L Final   Total Bilirubin 01/19/2022 0.4  0.3 - 1.2 mg/dL Final   GFR, Estimated 01/19/2022 >60  >60 mL/min Final   Comment: (NOTE) Calculated using the CKD-EPI Creatinine Equation (2021)    Anion gap 01/19/2022 14  5 - 15 Final   Performed at [facility], [place] [address] [place], [place] [address]   Hgb A1c MFr Bld 01/19/2022 5.3  4.8 - 5.6 % Final   Comment: (NOTE) Pre diabetes:          5.7%-6.4%  Diabetes:              >6.4%  Glycemic control for   <7.0% adults with diabetes    Mean Plasma Glucose 01/19/2022 105.41  mg/dL Final   Performed at [facility], [place] [address] [place], [place] [address]   Alcohol, Ethyl (B) 01/19/2022 290 (H)  <10 mg/dL Final   Comment: (NOTE) Lowest detectable limit for serum alcohol is 10 mg/dL.  For medical purposes only. Performed at [facility], [place] [address], [place], [place] [address]    Cholesterol 01/19/2022 251 (H)  0 - 200 mg/dL Final   Triglycerides 01/19/2022 163 (H)  <150 mg/dL Final   HDL 01/19/2022 105  >40 mg/dL Final   Total CHOL/HDL Ratio 01/19/2022 2.4  RATIO Final   VLDL 01/19/2022 33  0 - 40 mg/dL Final   LDL Cholesterol 01/19/2022 113 (H)  0 - 99 mg/dL Final   Comment:        Total Cholesterol/HDL:CHD Risk Coronary Heart Disease Risk Table                     Men   Women  1/2 Average Risk   3.4   3.3  Average Risk       5.0   4.4  2 X Average Risk   9.6    7.1  3 X Average Risk  23.4   11.0        Use the calculated Patient Ratio above and the CHD Risk Table to determine the patient's CHD Risk.        ATP III CLASSIFICATION (LDL):  <100     mg/dL   Optimal  100-129  mg/dL   Near or Above                    Optimal  130-159  mg/dL   Borderline  160-189  mg/dL   High  >190     mg/dL   Very High Performed at [place] [address] [place],  [address]    TSH 01/19/2022 0.415  0.350 - 4.500 uIU/mL Final   Comment: Performed by a 3rd Generation assay with a functional sensitivity of <=0.01 uIU/mL. Performed at [facility], [place] [address], [place], [place] [address]    POC Amphetamine UR 01/19/2022 None Detected  NONE DETECTED (Cut Off Level 1000 ng/mL) Final   POC Secobarbital (BAR) 01/19/2022 None Detected  NONE DETECTED (Cut Off Level 300 ng/mL) Final   POC Buprenorphine (BUP) 01/19/2022 None Detected  NONE DETECTED (Cut Off Level 10 ng/mL) Final   POC Oxazepam (BZO) 01/19/2022 None Detected  NONE DETECTED (Cut Off Level 300 ng/mL) Final   POC Cocaine UR 01/19/2022 None Detected  NONE DETECTED (Cut Off Level 300 ng/mL) Final   POC Methamphetamine UR 01/19/2022 None Detected  NONE DETECTED (Cut  Off Level 1000 ng/mL) Final   POC Morphine 01/19/2022 None Detected  NONE DETECTED (Cut Off Level 300 ng/mL) Final   POC Methadone UR 01/19/2022 None Detected  NONE DETECTED (Cut Off Level 300 ng/mL) Final   POC Oxycodone UR 01/19/2022 None Detected  NONE DETECTED (Cut Off Level 100 ng/mL) Final   POC Marijuana UR 01/19/2022 None Detected  NONE DETECTED (Cut Off Level 50 ng/mL) Final   SARSCOV2ONAVIRUS 2 AG 01/19/2022 NEGATIVE  NEGATIVE Final   Comment: (NOTE) SARS-CoV-2 antigen NOT DETECTED.   Negative results are presumptive.  Negative results do not preclude SARS-CoV-2 infection and should not be used as the sole basis for treatment or other patient management decisions, including infection  control decisions,  particularly in the presence of clinical signs and  symptoms consistent with COVID-19, or in those who have been in contact with the virus.  Negative results must be combined with clinical observations, patient history, and epidemiological information. The expected result is Negative.  Fact Sheet for Patients: HandmadeRecipes.com.cy  Fact Sheet for Healthcare Providers: FuneralLife.at  This test is not yet approved or cleared by the [place] FDA and  has been authorized for detection and/or diagnosis of SARS-CoV-2 by FDA under an Emergency Use Authorization (EUA).  This EUA will remain in effect (meaning this test can be used) for the duration of  the COV                          ID-19 declaration under Section 564(b)(1) of the Act, 21 U.S.C. section 360bbb-3(b)(1), unless the authorization is terminated or revoked sooner.    Admission on 01/11/2022, Discharged on 01/12/2022  Component Date Value Ref Range Status   WBC 01/11/2022 6.0  4.0 - 10.5 K/uL Final   RBC 01/11/2022 3.96 (L)  4.22 - 5.81 MIL/uL Final   Hemoglobin 01/11/2022 11.1 (L)  13.0 - 17.0 g/dL Final   HCT 01/11/2022 33.1 (L)  39.0 - 52.0 % Final   MCV 01/11/2022 83.6  80.0 - 100.0 fL Final   MCH 01/11/2022 28.0  26.0 - 34.0 pg Final   MCHC 01/11/2022 33.5  30.0 - 36.0 g/dL Final   RDW 01/11/2022 17.0 (H)  11.5 - 15.5 % Final   Platelets 01/11/2022 115 (L)  150 - 400 K/uL Final   nRBC 01/11/2022 0.0  0.0 - 0.2 % Final   Neutrophils Relative % 01/11/2022 54  % Final   Neutro Abs 01/11/2022 3.3  1.7 - 7.7 K/uL Final   Lymphocytes Relative 01/11/2022 29  % Final   Lymphs Abs 01/11/2022 1.7  0.7 - 4.0 K/uL Final   Monocytes Relative 01/11/2022 14  % Final   Monocytes Absolute 01/11/2022 0.8  0.1 - 1.0 K/uL Final   Eosinophils Relative 01/11/2022 1  % Final   Eosinophils Absolute 01/11/2022 0.1  0.0 - 0.5 K/uL Final   Basophils Relative 01/11/2022 2  % Final    Basophils Absolute 01/11/2022 0.1  0.0 - 0.1 K/uL Final   Immature Granulocytes 01/11/2022 0  % Final   Abs Immature Granulocytes 01/11/2022 0.02  0.00 - 0.07 K/uL Final   Performed at [facility], [place] [address] [place], [place] [address]   Sodium 01/11/2022 136  135 - 145 mmol/L Final   Potassium 01/11/2022 3.7  3.5 - 5.1 mmol/L Final   Chloride 01/11/2022 101  98 - 111 mmol/L Final   CO2 01/11/2022 21 (L)  22 - 32 mmol/L Final  Glucose, Bld 01/11/2022 106 (H)  70 - 99 mg/dL Final   Glucose reference range applies only to samples taken after fasting for at least 8 hours.   BUN 01/11/2022 5 (L)  6 - 20 mg/dL Final   Creatinine, Ser 01/11/2022 0.66  0.61 - 1.24 mg/dL Final   Calcium 01/11/2022 8.5 (L)  8.9 - 10.3 mg/dL Final   Total Protein 01/11/2022 8.0  6.5 - 8.1 g/dL Final   Albumin 01/11/2022 4.1  3.5 - 5.0 g/dL Final   AST 01/11/2022 149 (H)  15 - 41 U/L Final   ALT 01/11/2022 63 (H)  0 - 44 U/L Final   Alkaline Phosphatase 01/11/2022 131 (H)  38 - 126 U/L Final   Total Bilirubin 01/11/2022 0.7  0.3 - 1.2 mg/dL Final   GFR, Estimated 01/11/2022 >60  >60 mL/min Final   Comment: (NOTE) Calculated using the CKD-EPI Creatinine Equation (2021)    Anion gap 01/11/2022 14  5 - 15 Final   Performed at [place] [address], [place], [place] [address]   Alcohol, Ethyl (B) 01/11/2022 351 (HH)  <10 mg/dL Final   Comment: CRITICAL RESULT CALLED TO, READ BACK BY AND VERIFIED WITH B. SANGALANG RN, 0014, 01/12/22, [email or web address] (NOTE) Lowest detectable limit for serum alcohol is 10 mg/dL.  For medical purposes only. Performed at [facility], [place] [address], [place], [place] [address]   Admission on 12/08/2021, Discharged on 12/09/2021  Component Date Value Ref Range Status   Sodium 12/08/2021 140  135 - 145 mmol/L Final   Potassium 12/08/2021 3.8  3.5 - 5.1 mmol/L Final   Chloride 12/08/2021 103  98 - 111 mmol/L Final   CO2 12/08/2021 24  22 - 32 mmol/L Final    Glucose, Bld 12/08/2021 93  70 - 99 mg/dL Final   Glucose reference range applies only to samples taken after fasting for at least 8 hours.   BUN 12/08/2021 5 (L)  6 - 20 mg/dL Final   Creatinine, Ser 12/08/2021 0.66  0.61 - 1.24 mg/dL Final   Calcium 12/08/2021 8.7 (L)  8.9 - 10.3 mg/dL Final   Total Protein 12/08/2021 8.0  6.5 - 8.1 g/dL Final   Albumin 12/08/2021 4.2  3.5 - 5.0 g/dL Final   AST 12/08/2021 176 (H)  15 - 41 U/L Final   ALT 12/08/2021 75 (H)  0 - 44 U/L Final   Alkaline Phosphatase 12/08/2021 152 (H)  38 - 126 U/L Final   Total Bilirubin 12/08/2021 0.7  0.3 - 1.2 mg/dL Final   GFR, Estimated 12/08/2021 >60  >60 mL/min Final   Comment: (NOTE) Calculated using the CKD-EPI Creatinine Equation (2021)    Anion gap 12/08/2021 13  5 - 15 Final   Performed at [facility], [place] [address] [place], [place] [address]   Alcohol, Ethyl (B) 12/08/2021 372 (HH)  <10 mg/dL Final   Comment: CRITICAL RESULT CALLED TO, READ BACK BY AND VERIFIED WITH RIVERS,C AT 2216 ON 12/08/21 BY [email or web address] (NOTE) Lowest detectable limit for serum alcohol is 10 mg/dL.  For medical purposes only. Performed at [facility], [place] [address] [place], [place] [address]    Salicylate Lvl 21/19/4174 <7.0 (L)  7.0 - 30.0 mg/dL Final   Performed at [place] [address], [place], [place] [address]   Acetaminophen (Tylenol), Serum 12/08/2021 <10 (L)  10 - 30 ug/mL Final   Comment: (NOTE) Therapeutic concentrations vary significantly. A  range of 10-30 ug/mL  may be an effective concentration for many patients. However, some  are best treated at concentrations outside of this range. Acetaminophen concentrations >150 ug/mL at 4 hours after ingestion  and >50 ug/mL at 12 hours after ingestion are often associated with  toxic reactions.  Performed at [facility], [place] [address], [place], [place] [address]    WBC 12/08/2021 4.0   4.0 - 10.5 K/uL Final   RBC 12/08/2021 4.26  4.22 - 5.81 MIL/uL Final   Hemoglobin 12/08/2021 11.6 (L)  13.0 - 17.0 g/dL Final   HCT 12/08/2021 36.1 (L)  39.0 - 52.0 % Final   MCV 12/08/2021 84.7  80.0 - 100.0 fL Final   MCH 12/08/2021 27.2  26.0 - 34.0 pg Final   MCHC 12/08/2021 32.1  30.0 - 36.0 g/dL Final   RDW 12/08/2021 17.2 (H)  11.5 - 15.5 % Final   Platelets 12/08/2021 120 (L)  150 - 400 K/uL Final   nRBC 12/08/2021 0.0  0.0 - 0.2 % Final   Performed at [facility], [place] [address], [place], [place] [address]   Opiates 12/08/2021 NONE DETECTED  NONE DETECTED Final   Cocaine 12/08/2021 NONE DETECTED  NONE DETECTED Final   Benzodiazepines 12/08/2021 NONE DETECTED  NONE DETECTED Final   Amphetamines 12/08/2021 NONE DETECTED  NONE DETECTED Final   Tetrahydrocannabinol 12/08/2021 NONE DETECTED  NONE DETECTED Final   Barbiturates 12/08/2021 NONE DETECTED  NONE DETECTED Final   Comment: (NOTE) DRUG SCREEN FOR MEDICAL PURPOSES ONLY.  IF CONFIRMATION IS NEEDED FOR ANY PURPOSE, NOTIFY LAB WITHIN 5 DAYS.  LOWEST DETECTABLE LIMITS FOR URINE DRUG SCREEN Drug Class                     Cutoff (ng/mL) Amphetamine and metabolites    1000 Barbiturate and metabolites    200 Benzodiazepine                 951 Tricyclics and metabolites     300 Opiates and metabolites        300 Cocaine and metabolites        300 THC                            50 Performed at [facility], [place] [address] [place],  [address]   Admission on 12/06/2021, Discharged on 12/07/2021  Component Date Value Ref Range Status   Sodium 12/06/2021 138  135 - 145 mmol/L Final   Potassium 12/06/2021 3.6  3.5 - 5.1 mmol/L Final   Chloride 12/06/2021 103  98 - 111 mmol/L Final   CO2 12/06/2021 26  22 - 32 mmol/L Final   Glucose, Bld 12/06/2021 94  70 - 99 mg/dL Final   Glucose reference range applies only to samples taken after fasting for at least 8 hours.   BUN 12/06/2021  <5 (L)  6 - 20 mg/dL Final   Creatinine, Ser 12/06/2021 0.77  0.61 - 1.24 mg/dL Final   Calcium 12/06/2021 9.0  8.9 - 10.3 mg/dL Final   Total Protein 12/06/2021 8.2 (H)  6.5 - 8.1 g/dL Final   Albumin 12/06/2021 4.1  3.5 - 5.0 g/dL Final   AST 12/06/2021 149 (H)  15 - 41 U/L Final   ALT 12/06/2021 66 (H)  0 - 44 U/L Final   Alkaline Phosphatase 12/06/2021 147 (H)  38 - 126 U/L Final  Total Bilirubin 12/06/2021 0.6  0.3 - 1.2 mg/dL Final   GFR, Estimated 12/06/2021 >60  >60 mL/min Final   Comment: (NOTE) Calculated using the CKD-EPI Creatinine Equation (2021)    Anion gap 12/06/2021 9  5 - 15 Final   Performed at [facility], [place] [address] [place], [place] [address]   Alcohol, Ethyl (B) 12/06/2021 380 (HH)  <10 mg/dL Final   Comment: CRITICAL RESULT CALLED TO, READ BACK BY AND VERIFIED WITH GESELL, K @ 2003 [id number] (NOTE) Lowest detectable limit for serum alcohol is 10 mg/dL.  For medical purposes only. Performed at [facility], [place] [address] [place], [place] [address]    WBC 12/06/2021 4.8  4.0 - 10.5 K/uL Final   RBC 12/06/2021 4.41  4.22 - 5.81 MIL/uL Final   Hemoglobin 12/06/2021 12.1 (L)  13.0 - 17.0 g/dL Final   HCT 12/06/2021 37.9 (L)  39.0 - 52.0 % Final   MCV 12/06/2021 85.9  80.0 - 100.0 fL Final   MCH 12/06/2021 27.4  26.0 - 34.0 pg Final   MCHC 12/06/2021 31.9  30.0 - 36.0 g/dL Final   RDW 12/06/2021 17.2 (H)  11.5 - 15.5 % Final   Platelets 12/06/2021 114 (L)  150 - 400 K/uL Final   nRBC 12/06/2021 0.0  0.0 - 0.2 % Final   Performed at [facility], [place] [address] [place], [place] [address]   Magnesium 12/06/2021 2.2  1.7 - 2.4 mg/dL Final   Performed at [place] [address], [place], [place] [address]  Admission on 11/18/2021, Discharged on 11/18/2021  Component Date Value Ref Range Status   WBC 11/18/2021 4.5  4.0 - 10.5 K/uL Final   RBC 11/18/2021 4.24  4.22 - 5.81  MIL/uL Final   Hemoglobin 11/18/2021 11.7 (L)  13.0 - 17.0 g/dL Final   HCT 11/18/2021 36.1 (L)  39.0 - 52.0 % Final   MCV 11/18/2021 85.1  80.0 - 100.0 fL Final   MCH 11/18/2021 27.6  26.0 - 34.0 pg Final   MCHC 11/18/2021 32.4  30.0 - 36.0 g/dL Final   RDW 11/18/2021 16.4 (H)  11.5 - 15.5 % Final   Platelets 11/18/2021 107 (L)  150 - 400 K/uL Final   nRBC 11/18/2021 0.0  0.0 - 0.2 % Final   Neutrophils Relative % 11/18/2021 43  % Final   Neutro Abs 11/18/2021 2.0  1.7 - 7.7 K/uL Final   Lymphocytes Relative 11/18/2021 40  % Final   Lymphs Abs 11/18/2021 1.8  0.7 - 4.0 K/uL Final   Monocytes Relative 11/18/2021 12  % Final   Monocytes Absolute 11/18/2021 0.5  0.1 - 1.0 K/uL Final   Eosinophils Relative 11/18/2021 3  % Final   Eosinophils Absolute 11/18/2021 0.1  0.0 - 0.5 K/uL Final   Basophils Relative 11/18/2021 2  % Final   Basophils Absolute 11/18/2021 0.1  0.0 - 0.1 K/uL Final   Immature Granulocytes 11/18/2021 0  % Final   Abs Immature Granulocytes 11/18/2021 0.01  0.00 - 0.07 K/uL Final   Performed at [facility], [place] [address], [place], [place] [address]   Sodium 11/18/2021 140  135 - 145 mmol/L Final   Potassium 11/18/2021 3.5  3.5 - 5.1 mmol/L Final   Chloride 11/18/2021 108  98 - 111 mmol/L Final   CO2 11/18/2021 21 (L)  22 - 32 mmol/L Final   Glucose, Bld 11/18/2021 90  70 - 99  mg/dL Final   Glucose reference range applies only to samples taken after fasting for at least 8 hours.   BUN 11/18/2021 <5 (L)  6 - 20 mg/dL Final   Creatinine, Ser 11/18/2021 0.71  0.61 - 1.24 mg/dL Final   Calcium 11/18/2021 8.4 (L)  8.9 - 10.3 mg/dL Final   Total Protein 11/18/2021 7.4  6.5 - 8.1 g/dL Final   Albumin 11/18/2021 3.7  3.5 - 5.0 g/dL Final   AST 11/18/2021 91 (H)  15 - 41 U/L Final   ALT 11/18/2021 48 (H)  0 - 44 U/L Final   Alkaline Phosphatase 11/18/2021 178 (H)  38 - 126 U/L Final   Total Bilirubin 11/18/2021 0.5  0.3 - 1.2 mg/dL Final   GFR, Estimated 11/18/2021  >60  >60 mL/min Final   Comment: (NOTE) Calculated using the CKD-EPI Creatinine Equation (2021)    Anion gap 11/18/2021 11  5 - 15 Final   Performed at [facility], [place] [address], [place], [place] [address]   Alcohol, Ethyl (B) 11/18/2021 336 (HH)  <10 mg/dL Final   Comment: CRITICAL RESULT CALLED TO, READ BACK BY AND VERIFIED WITH DESMOND JAY,RN AT 1455 11/18/2021 BY ZBEECH. (NOTE) Lowest detectable limit for serum alcohol is 10 mg/dL.  For medical purposes only. Performed at [facility], [place] [address] [place], [place] [address]    Glucose-Capillary 11/18/2021 98  70 - 99 mg/dL Final   Glucose reference range applies only to samples taken after fasting for at least 8 hours.   Comment 1 11/18/2021 Notify RN   Final   Comment 2 11/18/2021 Document in Chart   Final  Admission on 11/16/2021, Discharged on 11/16/2021  Component Date Value Ref Range Status   WBC 11/16/2021 4.1  4.0 - 10.5 K/uL Final   RBC 11/16/2021 4.20 (L)  4.22 - 5.81 MIL/uL Final   Hemoglobin 11/16/2021 11.5 (L)  13.0 - 17.0 g/dL Final   HCT 11/16/2021 35.3 (L)  39.0 - 52.0 % Final   MCV 11/16/2021 84.0  80.0 - 100.0 fL Final   MCH 11/16/2021 27.4  26.0 - 34.0 pg Final   MCHC 11/16/2021 32.6  30.0 - 36.0 g/dL Final   RDW 11/16/2021 16.3 (H)  11.5 - 15.5 % Final   Platelets 11/16/2021 127 (L)  150 - 400 K/uL Final   nRBC 11/16/2021 0.0  0.0 - 0.2 % Final   Neutrophils Relative % 11/16/2021 38  % Final   Neutro Abs 11/16/2021 1.5 (L)  1.7 - 7.7 K/uL Final   Lymphocytes Relative 11/16/2021 45  % Final   Lymphs Abs 11/16/2021 1.9  0.7 - 4.0 K/uL Final   Monocytes Relative 11/16/2021 12  % Final   Monocytes Absolute 11/16/2021 0.5  0.1 - 1.0 K/uL Final   Eosinophils Relative 11/16/2021 3  % Final   Eosinophils Absolute 11/16/2021 0.1  0.0 - 0.5 K/uL Final   Basophils Relative 11/16/2021 2  % Final   Basophils Absolute 11/16/2021 0.1  0.0 - 0.1 K/uL Final   Immature Granulocytes 11/16/2021 0  %  Final   Abs Immature Granulocytes 11/16/2021 0.01  0.00 - 0.07 K/uL Final   Performed at [facility], [place] [address], [place], [place] [address]   Sodium 11/16/2021 140  135 - 145 mmol/L Final   Potassium 11/16/2021 3.7  3.5 - 5.1 mmol/L Final   Chloride 11/16/2021 107  98 - 111 mmol/L Final   CO2 11/16/2021 22  22 -  32 mmol/L Final   Glucose, Bld 11/16/2021 93  70 - 99 mg/dL Final   Glucose reference range applies only to samples taken after fasting for at least 8 hours.   BUN 11/16/2021 <5 (L)  6 - 20 mg/dL Final   Creatinine, Ser 11/16/2021 0.77  0.61 - 1.24 mg/dL Final   Calcium 11/16/2021 8.8 (L)  8.9 - 10.3 mg/dL Final   Total Protein 11/16/2021 7.8  6.5 - 8.1 g/dL Final   Albumin 11/16/2021 4.0  3.5 - 5.0 g/dL Final   AST 11/16/2021 103 (H)  15 - 41 U/L Final   ALT 11/16/2021 52 (H)  0 - 44 U/L Final   Alkaline Phosphatase 11/16/2021 197 (H)  38 - 126 U/L Final   Total Bilirubin 11/16/2021 0.4  0.3 - 1.2 mg/dL Final   GFR, Estimated 11/16/2021 >60  >60 mL/min Final   Comment: (NOTE) Calculated using the CKD-EPI Creatinine Equation (2021)    Anion gap 11/16/2021 11  5 - 15 Final   Performed at [facility], [place] [address] [place], [place] [address]  Admission on 11/08/2021, Discharged on 11/09/2021  Component Date Value Ref Range Status   Glucose-Capillary 11/08/2021 90  70 - 99 mg/dL Final   Glucose reference range applies only to samples taken after fasting for at least 8 hours.  There may be more visits with results that are not included.    Blood Alcohol level:  Lab Results  Component Value Date   ETH 336 (HH) 01/26/2022   ETH 268 (H) 62/69/4854    Metabolic Disorder Labs: Lab Results  Component Value Date   HGBA1C 5.3 01/19/2022   MPG 105.41 01/19/2022   MPG 116.89 07/17/2021   Lab Results  Component Value Date   PROLACTIN 23.2 (H) 03/07/2016   Lab Results  Component Value Date   CHOL 251 (H) 01/19/2022   TRIG 163 (H) 01/19/2022   HDL 105  01/19/2022   CHOLHDL 2.4 01/19/2022   VLDL 33 01/19/2022   LDLCALC 113 (H) 01/19/2022   LDLCALC 66 07/17/2021    Therapeutic Lab Levels: No results found for: "LITHIUM" No results found for: "VALPROATE" No results found for: "CBMZ"  Physical Findings   AIMS    Flowsheet Row Admission (Discharged) from 07/16/2017 in [place] [facility]B Admission (Discharged) from 05/15/2017 in [place] [facility]B Admission (Discharged) from 07/06/2016 in [place] 300B Admission (Discharged) from 06/15/2016 in [place] 400B Admission (Discharged) from 03/05/2016 in [place] 500B  AIMS Total Score 0 0 0 0 0      AUDIT    Flowsheet Row Admission (Discharged) from 07/16/2021 in [place] 400B Admission (Discharged) from 07/16/2017 in [place] 300B Admission (Discharged) from 05/15/2017 in [place] 300B Admission (Discharged) from 07/06/2016 in [place] 300B Admission (Discharged) from 06/15/2016 in [place] 400B  Alcohol Use Disorder Identification Test Final Score (AUDIT) 23 40 30 36 Gay Office Visit from 02/01/2019 in [place]  Total GAD-7 Score 7      PHQ2-9    Flowsheet Row ED from 01/27/2022 in [facility] ED from 01/19/2022 in [facility] ED from 08/07/2021 in [place] DEPT ED from 09/04/2020 in  [place] ED from 06/17/2020 in [place] DEPT  PHQ-2 Total Score '6 2 4 4 4  '$ PHQ-9 Total Score '23 2 14 18 21      '$ Flowsheet Row ED from 01/27/2022 in [facility] ED  from 01/26/2022 in [place] DEPT ED from 01/24/2022 in [place] DEPT  C-SSRS RISK CATEGORY No Risk High Risk High Risk        Musculoskeletal  Strength & Muscle Tone: within normal limits Gait & Station: normal Patient leans: N/A  Psychiatric Specialty Exam  Presentation  General Appearance:  Appropriate for Environment; Casual  Eye Contact: Good  Speech: Clear and Coherent; Normal Rate  Speech Volume: Normal  Handedness: Right   Mood and Affect  Mood: Euthymic  Affect: Appropriate; Congruent   Thought Process  Thought Processes: Coherent; Goal Directed  Descriptions of Associations:Intact  Orientation:Full (Time, Place and Person)  Thought Content:Logical; WDL  Diagnosis of Schizophrenia or Schizoaffective disorder in past: No    Hallucinations:Hallucinations: None  Ideas of Reference:None  Suicidal Thoughts:Suicidal Thoughts: No  Homicidal Thoughts:Homicidal Thoughts: No   Sensorium  Memory: Immediate Fair; Recent Fair  Judgment: Fair  Insight: Fair   Community education officer  Concentration: Good  Attention Span: Good  Recall: Good  Fund of Knowledge: Good  Language: Good   Psychomotor Activity  Psychomotor Activity:Psychomotor Activity: Normal   Assets  Assets: Desire for Improvement; Communication Skills; Financial Resources/Insurance; Resilience   Sleep  Sleep:Sleep: Good   No data recorded  Physical Exam  Physical Exam Vitals and nursing note reviewed.  Constitutional:      General: He is not in acute distress.    Appearance: Normal appearance. He is normal weight. He is not ill-appearing or toxic-appearing.  HENT:     Head: Normocephalic and atraumatic.  Pulmonary:     Effort: Pulmonary effort is normal.  Musculoskeletal:        General: Normal range of motion.  Neurological:     General: No focal deficit present.     Mental Status: He is  alert.    Review of Systems  Respiratory:  Negative for cough and shortness of breath.   Cardiovascular:  Negative for chest pain.  Gastrointestinal:  Negative for abdominal pain, constipation, diarrhea, nausea and vomiting.  Neurological:  Positive for dizziness (mild) and tremors (mild). Negative for weakness and headaches.  Psychiatric/Behavioral:  Negative for depression, hallucinations and suicidal ideas. The patient is not nervous/anxious and does not have insomnia.    Blood pressure 119/73, pulse 96, temperature 98.7 F (37.1 C), temperature source Oral, resp. rate 18, SpO2 100 %. There is no height or weight on file to calculate BMI.  Treatment Plan Summary: Daily contact with patient to assess and evaluate symptoms and progress in treatment and Medication management  NIEKO CLARIN is a 56 y.o. male who presented to [facility] on 11/8 with initial complaints of SI/HI but reporting significant EtOH abuse, he was admitted to [facility] on 11/9 for Detox and Residential Placement.  MARVELOUS BOUWENS is a 56 y.o. male who presented to [facility] on 11/8 with initial complaints of SI/HI but reporting significant EtOH abuse, he was admitted to [facility] on 11/9 for Detox and Residential Placement.  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   Dj is tolerating Detox well with minimal withdrawal symptoms that are improving.  He tolerated starting Remeron well  without any side effects.  We will not make any medication changes at this time.  He is still interested in Residential Treatment and will work with Social Work on Monday.   Withdrawal: -Continue CIWA, last score= 0  at 05:38  11/11 -Continue Ativan 1 mg q6 PRN CIWA>10 -Continue Imodium 2-4 mg PRN diarrhea -Continue Zofran-ODT 4 mg q6 PRN nausea -Continue Thiamine 100 mg daily for nutritional supplementation -Continue Multivitamin daily for nutritional  supplementation   Depression: -Continue Celexa 10 mg daily -Continue Remeron 15 mg QHS -Continue Gabapentin 300 mg TID.   -Continue PRN's: Tylenol, Maalox, Atarax, Milk of Magnesia    Dispo: [place], MD 01/30/2022 9:47 AM

## 2022-01-31 DIAGNOSIS — D509 Iron deficiency anemia, unspecified: Secondary | ICD-10-CM | POA: Diagnosis not present

## 2022-01-31 DIAGNOSIS — F332 Major depressive disorder, recurrent severe without psychotic features: Secondary | ICD-10-CM | POA: Diagnosis not present

## 2022-01-31 DIAGNOSIS — K709 Alcoholic liver disease, unspecified: Secondary | ICD-10-CM | POA: Diagnosis not present

## 2022-01-31 DIAGNOSIS — F1010 Alcohol abuse, uncomplicated: Secondary | ICD-10-CM | POA: Diagnosis not present

## 2022-01-31 LAB — COMPREHENSIVE METABOLIC PANEL
ALT: 140 U/L — ABNORMAL HIGH (ref 0–44)
AST: 148 U/L — ABNORMAL HIGH (ref 15–41)
Albumin: 4 g/dL (ref 3.5–5.0)
Alkaline Phosphatase: 132 U/L — ABNORMAL HIGH (ref 38–126)
Anion gap: 11 (ref 5–15)
BUN: 10 mg/dL (ref 6–20)
CO2: 27 mmol/L (ref 22–32)
Calcium: 9.5 mg/dL (ref 8.9–10.3)
Chloride: 98 mmol/L (ref 98–111)
Creatinine, Ser: 1.04 mg/dL (ref 0.61–1.24)
GFR, Estimated: >60 mL/min (ref >60)
Glucose, Bld: 136 mg/dL — ABNORMAL HIGH (ref 70–99)
Potassium: 4.1 mmol/L (ref 3.5–5.1)
Sodium: 136 mmol/L (ref 135–145)
Total Bilirubin: 0.3 mg/dL (ref 0.3–1.2)
Total Protein: 7.6 g/dL (ref 6.5–8.1)

## 2022-01-31 LAB — CBC WITH DIFFERENTIAL/PLATELET
Abs Immature Granulocytes: 0.01 10*3/uL (ref 0.00–0.07)
Basophils Absolute: 0.1 10*3/uL (ref 0.0–0.1)
Basophils Relative: 2 %
Eosinophils Absolute: 0.1 10*3/uL (ref 0.0–0.5)
Eosinophils Relative: 2 %
HCT: 34.3 % — ABNORMAL LOW (ref 39.0–52.0)
Hemoglobin: 10.8 g/dL — ABNORMAL LOW (ref 13.0–17.0)
Immature Granulocytes: 0 %
Lymphocytes Relative: 37 %
Lymphs Abs: 2 10*3/uL (ref 0.7–4.0)
MCH: 27.2 pg (ref 26.0–34.0)
MCHC: 31.5 g/dL (ref 30.0–36.0)
MCV: 86.4 fL (ref 80.0–100.0)
Monocytes Absolute: 0.7 10*3/uL (ref 0.1–1.0)
Monocytes Relative: 14 %
Neutro Abs: 2.4 10*3/uL (ref 1.7–7.7)
Neutrophils Relative %: 45 %
Platelets: 239 10*3/uL (ref 150–400)
RBC: 3.97 MIL/uL — ABNORMAL LOW (ref 4.22–5.81)
RDW: 15.9 % — ABNORMAL HIGH (ref 11.5–15.5)
WBC: 5.4 10*3/uL (ref 4.0–10.5)
nRBC: 0 % (ref 0.0–0.2)

## 2022-01-31 NOTE — ED Notes (Addendum)
Performed blood draw to L AC x1 stick unsuccessfully as pt jumped and needle dislodged. Performed blood draw to R AC x1 stick successfully. Pt tolerated well. [facility] courier called to transport specimen to [facility] lab. Pt denies needs or concerns at present. No acute distress noted. Pt returned to puzzle. Safety maintained.

## 2022-01-31 NOTE — ED Notes (Signed)
Pt asleep in bed. Respirations even and unlabored. Monitoring for safety. 

## 2022-01-31 NOTE — ED Notes (Signed)
Pt sitting in dayroom reading a book. Denies concerns at present. Denies SI/HI/AVH. Denies withdrawal sx from ETOH. Pleasant on approach. Informed pt to notify staff with any needs or concerns. Safety maintained. Will continue to monitor for safety.

## 2022-01-31 NOTE — ED Provider Notes (Signed)
Behavioral Health Progress Note  Date and Time: 01/31/2022 7:54 AM Name: Barry Moore MRN:  [id number]  Subjective:   Barry Moore is a 56 y.o. male who presented to [facility] on 11/8 with initial complaints of SI/HI but reporting significant EtOH abuse, he was admitted to [facility] on 11/9 for Detox and Residential Placement.  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   He reports that he is doing okay today.  He reports that his withdrawal symptoms of tremors and dizziness are continuing to improve and are mild.  He reports he is still having cravings but that he knows if he is hearing the drinking and has not been.  He reports the Remeron is helping him sleep and that he slept better than he has in a while last night.  He reports no side effects to his medications.  Discussed naltrexone with him.  Discussed that this medication can help reduce intensity and frequency of cravings and if this is something he would be doing.  He reports he started before and would like to consider it.  Discussed needing to check labs prior to starting and that the provider tomorrow we will discuss further with him. 6.  He reports he still wanting residential treatment and social worker will work with him tomorrow to discuss options available to him.  He reports no SI, HI, or AVH.  He reports his sleep is good.  That has been good.  He reports mild tremor and dizziness due to withdrawal and is improving.  He reports no evidence of depression.   Diagnosis:  Final diagnoses:  Alcohol abuse  Homelessness  Depressed affect    Total Time spent with patient: 15 minutes  Past Psychiatric History: Depression and EtOH Abuse complicated by pancreatitis, GI bleed, thrombocytopenia, hypertension, homelessness.  Past Medical History:  Past Medical History:  Diagnosis Date   Alcoholic hepatitis    Depression    ETOH abuse     Gastric bleed 08/2018   Hypertension    Pancreatitis    Seizures ([place])    Suicidal behavior     Past Surgical History:  Procedure Laterality Date   BIOPSY  08/05/2018   Procedure: BIOPSY;  Surgeon: Irving Copas., MD;  Location: [facility] ENDOSCOPY;  Service: Gastroenterology;;   BIOPSY  08/07/2018   Procedure: BIOPSY;  Surgeon: Thornton Park, MD;  Location: [facility] ENDOSCOPY;  Service: Gastroenterology;;   BIOPSY  01/02/2019   Procedure: BIOPSY;  Surgeon: Gatha Mayer, MD;  Location: [facility] ENDOSCOPY;  Service: Endoscopy;;   COLONOSCOPY WITH PROPOFOL N/A 08/07/2018   Procedure: COLONOSCOPY WITH PROPOFOL;  Surgeon: Thornton Park, MD;  Location: [place];  Service: Gastroenterology;  Laterality: N/A;   ESOPHAGOGASTRODUODENOSCOPY N/A 01/02/2019   Procedure: ESOPHAGOGASTRODUODENOSCOPY (EGD);  Surgeon: Gatha Mayer, MD;  Location: [facility] ENDOSCOPY;  Service: Endoscopy;  Laterality: N/A;   ESOPHAGOGASTRODUODENOSCOPY N/A 07/03/2019   Procedure: ESOPHAGOGASTRODUODENOSCOPY (EGD);  Surgeon: Wonda Horner, MD;  Location: [facility] ENDOSCOPY;  Service: Endoscopy;  Laterality: N/A;   ESOPHAGOGASTRODUODENOSCOPY N/A 06/26/2020   Procedure: ESOPHAGOGASTRODUODENOSCOPY (EGD);  Surgeon: Arta Silence, MD;  Location: [facility] ENDOSCOPY;  Service: Endoscopy;  Laterality: N/A;   ESOPHAGOGASTRODUODENOSCOPY (EGD) WITH PROPOFOL N/A 11/02/2016   Procedure: ESOPHAGOGASTRODUODENOSCOPY (EGD) WITH PROPOFOL;  Surgeon: Carol Ada, MD;  Location: [facility] ENDOSCOPY;  Service: Endoscopy;  Laterality: N/A;   ESOPHAGOGASTRODUODENOSCOPY (EGD) WITH PROPOFOL N/A 08/05/2018   Procedure: ESOPHAGOGASTRODUODENOSCOPY (EGD) WITH PROPOFOL;  Surgeon: Rush Landmark Telford Nab., MD;  Location:  [place] ENDOSCOPY;  Service: Gastroenterology;  Laterality: N/A;   ESOPHAGOGASTRODUODENOSCOPY (EGD) WITH PROPOFOL N/A 07/14/2019   Procedure: ESOPHAGOGASTRODUODENOSCOPY (EGD) WITH PROPOFOL;  Surgeon: Otis Brace, MD;  Location: [facility] ENDOSCOPY;  Service:  Gastroenterology;  Laterality: N/A;   HERNIA REPAIR     LEG SURGERY     POLYPECTOMY  08/07/2018   Procedure: POLYPECTOMY;  Surgeon: Thornton Park, MD;  Location: [facility] ENDOSCOPY;  Service: Gastroenterology;;   Family History:  Family History  Problem Relation Age of Onset   Diabetes Mother    Alcoholism Mother    Emphysema Father    Lung cancer Father    Alcoholism Father    Family Psychiatric  History: Father- EtOH Abuse Mother- EtOH Abuse Social History:  Social History   Substance and Sexual Activity  Alcohol Use Yes   Comment: 5+ BEERS DAILY, Patient does not know how much he drank tonight     Social History   Substance and Sexual Activity  Drug Use Not Currently   Frequency: 3.0 times per week   Types: Cocaine    Social History   Socioeconomic History   Marital status: Divorced    Spouse name: Not on file   Number of children: Not on file   Years of education: Not on file   Highest education level: Not on file  Occupational History   Not on file  Tobacco Use   Smoking status: Every Day    Packs/day: 1.00    Types: Cigarettes   Smokeless tobacco: Never  Vaping Use   Vaping Use: Never used  Substance and Sexual Activity   Alcohol use: Yes    Comment: 5+ BEERS DAILY, Patient does not know how much he drank tonight   Drug use: Not Currently    Frequency: 3.0 times per week    Types: Cocaine   Sexual activity: Not Currently  Other Topics Concern   Not on file  Social History Narrative   Not on file   Social Determinants of Health   Financial Resource Strain: Not on file  Food Insecurity: Not on file  Transportation Needs: Not on file  Physical Activity: Not on file  Stress: Not on file  Social Connections: Not on file   SDOH:  SDOH Screenings   Alcohol Screen: High Risk (07/16/2021)  Depression (PHQ2-9): High Risk (01/28/2022)  Tobacco Use: High Risk (01/26/2022)   Additional Social History:                         Sleep:  Good  Appetite:  Good  Current Medications:  Current Facility-Administered Medications  Medication Dose Route Frequency Provider Last Rate Last Admin   acetaminophen (TYLENOL) tablet 650 mg  650 mg Oral Q6H PRN Evette Georges, NP       alum & mag hydroxide-simeth (MAALOX/MYLANTA) 200-200-20 MG/5ML suspension 30 mL  30 mL Oral Q4H PRN Evette Georges, NP       citalopram (CELEXA) tablet 10 mg  10 mg Oral Daily Evette Georges, NP   10 mg at 01/30/22 5400   gabapentin (NEURONTIN) capsule 300 mg  300 mg Oral TID Evette Georges, NP   300 mg at 01/30/22 2102   magnesium hydroxide (MILK OF MAGNESIA) suspension 30 mL  30 mL Oral Daily PRN Evette Georges, NP       mirtazapine (REMERON) tablet 15 mg  15 mg Oral QHS Derrill Center, NP   15 mg at 01/30/22 2102   multivitamin with minerals tablet 1  tablet  1 tablet Oral Daily Evette Georges, NP   1 tablet at 01/30/22 3154   thiamine (VITAMIN B1) tablet 100 mg  100 mg Oral Daily Evette Georges, NP   100 mg at 01/30/22 0908   Current Outpatient Medications  Medication Sig Dispense Refill   citalopram (CELEXA) 10 MG tablet Take 1 tablet (10 mg total) by mouth daily. (Patient not taking: Reported on 01/26/2022) 7 tablet 0   gabapentin (NEURONTIN) 300 MG capsule Take 1 capsule (300 mg total) by mouth 3 (three) times daily. (Patient not taking: Reported on 01/26/2022) 90 capsule 0   Multiple Vitamin (MULTIVITAMIN WITH MINERALS) TABS tablet Take 1 tablet by mouth daily. (Patient not taking: Reported on 01/26/2022) 30 tablet 0   thiamine (VITAMIN B-1) 100 MG tablet Take 1 tablet (100 mg total) by mouth daily. 30 tablet 0    Labs  Lab Results:  Admission on 01/26/2022, Discharged on 01/27/2022  Component Date Value Ref Range Status   WBC 01/26/2022 5.8  4.0 - 10.5 K/uL Final   RBC 01/26/2022 4.05 (L)  4.22 - 5.81 MIL/uL Final   Hemoglobin 01/26/2022 10.9 (L)  13.0 - 17.0 g/dL Final   HCT 01/26/2022 34.5 (L)  39.0 - 52.0 % Final   MCV 01/26/2022 85.2  80.0 - 100.0 fL  Final   MCH 01/26/2022 26.9  26.0 - 34.0 pg Final   MCHC 01/26/2022 31.6  30.0 - 36.0 g/dL Final   RDW 01/26/2022 16.0 (H)  11.5 - 15.5 % Final   Platelets 01/26/2022 192  150 - 400 K/uL Final   nRBC 01/26/2022 0.0  0.0 - 0.2 % Final   Neutrophils Relative % 01/26/2022 44  % Final   Neutro Abs 01/26/2022 2.5  1.7 - 7.7 K/uL Final   Lymphocytes Relative 01/26/2022 38  % Final   Lymphs Abs 01/26/2022 2.2  0.7 - 4.0 K/uL Final   Monocytes Relative 01/26/2022 15  % Final   Monocytes Absolute 01/26/2022 0.9  0.1 - 1.0 K/uL Final   Eosinophils Relative 01/26/2022 2  % Final   Eosinophils Absolute 01/26/2022 0.1  0.0 - 0.5 K/uL Final   Basophils Relative 01/26/2022 1  % Final   Basophils Absolute 01/26/2022 0.1  0.0 - 0.1 K/uL Final   Immature Granulocytes 01/26/2022 0  % Final   Abs Immature Granulocytes 01/26/2022 0.02  0.00 - 0.07 K/uL Final   Performed at [facility], [place] [address] [place], [place] [address]   Sodium 01/26/2022 136  135 - 145 mmol/L Final   Potassium 01/26/2022 3.7  3.5 - 5.1 mmol/L Final   Chloride 01/26/2022 105  98 - 111 mmol/L Final   CO2 01/26/2022 21 (L)  22 - 32 mmol/L Final   Glucose, Bld 01/26/2022 109 (H)  70 - 99 mg/dL Final   Glucose reference range applies only to samples taken after fasting for at least 8 hours.   BUN 01/26/2022 9  6 - 20 mg/dL Final   Creatinine, Ser 01/26/2022 0.59 (L)  0.61 - 1.24 mg/dL Final   Calcium 01/26/2022 9.5  8.9 - 10.3 mg/dL Final   Total Protein 01/26/2022 8.3 (H)  6.5 - 8.1 g/dL Final   Albumin 01/26/2022 4.2  3.5 - 5.0 g/dL Final   AST 01/26/2022 153 (H)  15 - 41 U/L Final   ALT 01/26/2022 90 (H)  0 - 44 U/L Final   Alkaline Phosphatase 01/26/2022 133 (H)  38 - 126 U/L Final   Total Bilirubin  01/26/2022 0.5  0.3 - 1.2 mg/dL Final   GFR, Estimated 01/26/2022 >60  >60 mL/min Final   Comment: (NOTE) Calculated using the CKD-EPI Creatinine Equation (2021)    Anion gap 01/26/2022 10  5 - 15 Final    Performed at [facility], [place] [address] [place], [place] [address]   Magnesium 01/26/2022 2.5 (H)  1.7 - 2.4 mg/dL Final   Performed at [place] [address] [place], [place] [address]   Alcohol, Ethyl (B) 01/26/2022 336 (HH)  <10 mg/dL Final   Comment: CRITICAL RESULT CALLED TO, READ BACK BY AND VERIFIED WITH ROHR,A AT 2128 ON 01/26/22 BY VAZQUEZJ (NOTE) Lowest detectable limit for serum alcohol is 10 mg/dL.  For medical purposes only. Performed at [facility], [place] [address], [place], [place] [address]    Opiates 01/27/2022 NONE DETECTED  NONE DETECTED Final   Cocaine 01/27/2022 NONE DETECTED  NONE DETECTED Final   Benzodiazepines 01/27/2022 POSITIVE (A)  NONE DETECTED Final   Amphetamines 01/27/2022 NONE DETECTED  NONE DETECTED Final   Tetrahydrocannabinol 01/27/2022 NONE DETECTED  NONE DETECTED Final   Barbiturates 01/27/2022 NONE DETECTED  NONE DETECTED Final   Comment: (NOTE) DRUG SCREEN FOR MEDICAL PURPOSES ONLY.  IF CONFIRMATION IS NEEDED FOR ANY PURPOSE, NOTIFY LAB WITHIN 5 DAYS.  LOWEST DETECTABLE LIMITS FOR URINE DRUG SCREEN Drug Class                     Cutoff (ng/mL) Amphetamine and metabolites    1000 Barbiturate and metabolites    200 Benzodiazepine                 200 Opiates and metabolites        300 Cocaine and metabolites        300 THC                            50 Performed at [facility], [place] [address], [place], [place] [address]    Color, Urine 01/27/2022 YELLOW  YELLOW Final   APPearance 01/27/2022 CLEAR  CLEAR Final   Specific Gravity, Urine 01/27/2022 1.024  1.005 - 1.030 Final   pH 01/27/2022 5.0  5.0 - 8.0 Final   Glucose, UA 01/27/2022 NEGATIVE  NEGATIVE mg/dL Final   Hgb urine dipstick 01/27/2022 NEGATIVE  NEGATIVE Final   Bilirubin Urine 01/27/2022 NEGATIVE  NEGATIVE Final   Ketones, ur 01/27/2022 NEGATIVE  NEGATIVE mg/dL Final   Protein, ur 01/27/2022  NEGATIVE  NEGATIVE mg/dL Final   Nitrite 01/27/2022 NEGATIVE  NEGATIVE Final   Leukocytes,Ua 01/27/2022 TRACE (A)  NEGATIVE Final   RBC / HPF 01/27/2022 0-5  0 - 5 RBC/hpf Final   WBC, UA 01/27/2022 6-10  0 - 5 WBC/hpf Final   Bacteria, UA 01/27/2022 NONE SEEN  NONE SEEN Final   Squamous Epithelial / LPF 01/27/2022 0-5  0 - 5 Final   Mucus 01/27/2022 PRESENT   Final   Performed at [facility], [place] [address] [place], [place] [address]   SARS Coronavirus 2 by RT PCR 01/27/2022 NEGATIVE  NEGATIVE Final   Comment: (NOTE) SARS-CoV-2 target nucleic acids are NOT DETECTED.  The SARS-CoV-2 RNA is generally detectable in upper respiratory specimens during the acute phase of infection. The lowest concentration of SARS-CoV-2 viral copies this assay can detect is 138 copies/mL. A negative result does not preclude SARS-Cov-2 infection and should  not be used as the sole basis for treatment or other patient management decisions. A negative result may occur with  improper specimen collection/handling, submission of specimen other than nasopharyngeal swab, presence of viral mutation(s) within the areas targeted by this assay, and inadequate number of viral copies(<138 copies/mL). A negative result must be combined with clinical observations, patient history, and epidemiological information. The expected result is Negative.  Fact Sheet for Patients:  EntrepreneurPulse.com.au  Fact Sheet for Healthcare Providers:  IncredibleEmployment.be  This test is no                          t yet approved or cleared by the [place] FDA and  has been authorized for detection and/or diagnosis of SARS-CoV-2 by FDA under an Emergency Use Authorization (EUA). This EUA will remain  in effect (meaning this test can be used) for the duration of the COVID-19 declaration under Section 564(b)(1) of the Act, 21 U.S.C.section 360bbb-3(b)(1), unless the  authorization is terminated  or revoked sooner.       Influenza A by PCR 01/27/2022 NEGATIVE  NEGATIVE Final   Influenza B by PCR 01/27/2022 NEGATIVE  NEGATIVE Final   Comment: (NOTE) The Xpert Xpress SARS-CoV-2/FLU/RSV plus assay is intended as an aid in the diagnosis of influenza from Nasopharyngeal swab specimens and should not be used as a sole basis for treatment. Nasal washings and aspirates are unacceptable for Xpert Xpress SARS-CoV-2/FLU/RSV testing.  Fact Sheet for Patients: EntrepreneurPulse.com.au  Fact Sheet for Healthcare Providers: IncredibleEmployment.be  This test is not yet approved or cleared by the [place] FDA and has been authorized for detection and/or diagnosis of SARS-CoV-2 by FDA under an Emergency Use Authorization (EUA). This EUA will remain in effect (meaning this test can be used) for the duration of the COVID-19 declaration under Section 564(b)(1) of the Act, 21 U.S.C. section 360bbb-3(b)(1), unless the authorization is terminated or revoked.  Performed at [facility], [place] [address], [place], [place] [address]   Admission on 01/24/2022, Discharged on 01/26/2022  Component Date Value Ref Range Status   Sodium 01/24/2022 139  135 - 145 mmol/L Final   Potassium 01/24/2022 3.8  3.5 - 5.1 mmol/L Final   Chloride 01/24/2022 107  98 - 111 mmol/L Final   CO2 01/24/2022 24  22 - 32 mmol/L Final   Glucose, Bld 01/24/2022 95  70 - 99 mg/dL Final   Glucose reference range applies only to samples taken after fasting for at least 8 hours.   BUN 01/24/2022 10  6 - 20 mg/dL Final   Creatinine, Ser 01/24/2022 0.79  0.61 - 1.24 mg/dL Final   Calcium 01/24/2022 8.4 (L)  8.9 - 10.3 mg/dL Final   Total Protein 01/24/2022 7.5  6.5 - 8.1 g/dL Final   Albumin 01/24/2022 3.8  3.5 - 5.0 g/dL Final   AST 01/24/2022 127 (H)  15 - 41 U/L Final   ALT 01/24/2022 78 (H)  0 - 44 U/L Final   Alkaline Phosphatase  01/24/2022 142 (H)  38 - 126 U/L Final   Total Bilirubin 01/24/2022 0.4  0.3 - 1.2 mg/dL Final   GFR, Estimated 01/24/2022 >60  >60 mL/min Final   Comment: (NOTE) Calculated using the CKD-EPI Creatinine Equation (2021)    Anion gap 01/24/2022 8  5 - 15 Final   Performed at [facility], [place] [address], [place], [place] [address]   Alcohol, Ethyl (B) 01/24/2022 268 (H)  <  10 mg/dL Final   Comment: (NOTE) Lowest detectable limit for serum alcohol is 10 mg/dL.  For medical purposes only. Performed at [facility], [place] [address] [place], [place] [address]    Opiates 01/25/2022 NONE DETECTED  NONE DETECTED Final   Cocaine 01/25/2022 NONE DETECTED  NONE DETECTED Final   Benzodiazepines 01/25/2022 NONE DETECTED  NONE DETECTED Final   Amphetamines 01/25/2022 NONE DETECTED  NONE DETECTED Final   Tetrahydrocannabinol 01/25/2022 NONE DETECTED  NONE DETECTED Final   Barbiturates 01/25/2022 NONE DETECTED  NONE DETECTED Final   Comment: (NOTE) DRUG SCREEN FOR MEDICAL PURPOSES ONLY.  IF CONFIRMATION IS NEEDED FOR ANY PURPOSE, NOTIFY LAB WITHIN 5 DAYS.  LOWEST DETECTABLE LIMITS FOR URINE DRUG SCREEN Drug Class                     Cutoff (ng/mL) Amphetamine and metabolites    1000 Barbiturate and metabolites    200 Benzodiazepine                 200 Opiates and metabolites        300 Cocaine and metabolites        300 THC                            50 Performed at [facility], [place] [address] [place], [place] [address]    WBC 01/24/2022 4.8  4.0 - 10.5 K/uL Final   RBC 01/24/2022 3.48 (L)  4.22 - 5.81 MIL/uL Final   Hemoglobin 01/24/2022 9.5 (L)  13.0 - 17.0 g/dL Final   HCT 01/24/2022 30.0 (L)  39.0 - 52.0 % Final   MCV 01/24/2022 86.2  80.0 - 100.0 fL Final   MCH 01/24/2022 27.3  26.0 - 34.0 pg Final   MCHC 01/24/2022 31.7  30.0 - 36.0 g/dL Final   RDW 01/24/2022 16.6 (H)  11.5 - 15.5 % Final   Platelets 01/24/2022 165  150 -  400 K/uL Final   nRBC 01/24/2022 0.0  0.0 - 0.2 % Final   Neutrophils Relative % 01/24/2022 33  % Final   Neutro Abs 01/24/2022 1.6 (L)  1.7 - 7.7 K/uL Final   Lymphocytes Relative 01/24/2022 47  % Final   Lymphs Abs 01/24/2022 2.3  0.7 - 4.0 K/uL Final   Monocytes Relative 01/24/2022 15  % Final   Monocytes Absolute 01/24/2022 0.7  0.1 - 1.0 K/uL Final   Eosinophils Relative 01/24/2022 3  % Final   Eosinophils Absolute 01/24/2022 0.2  0.0 - 0.5 K/uL Final   Basophils Relative 01/24/2022 2  % Final   Basophils Absolute 01/24/2022 0.1  0.0 - 0.1 K/uL Final   Immature Granulocytes 01/24/2022 0  % Final   Abs Immature Granulocytes 01/24/2022 0.00  0.00 - 0.07 K/uL Final   Performed at [facility], [place] [address], [place], [place] [address]   WBC 01/25/2022 4.6  4.0 - 10.5 K/uL Final   RBC 01/25/2022 4.00 (L)  4.22 - 5.81 MIL/uL Final   Hemoglobin 01/25/2022 10.6 (L)  13.0 - 17.0 g/dL Final   HCT 01/25/2022 34.3 (L)  39.0 - 52.0 % Final   MCV 01/25/2022 85.8  80.0 - 100.0 fL Final   MCH 01/25/2022 26.5  26.0 - 34.0 pg Final   MCHC 01/25/2022 30.9  30.0 - 36.0 g/dL Final   RDW 01/25/2022 16.5 (H)  11.5 - 15.5 % Final  Platelets 01/25/2022 199  150 - 400 K/uL Final   nRBC 01/25/2022 0.0  0.0 - 0.2 % Final   Performed at [facility], [place] [address] [place], [place] [address]  Admission on 01/22/2022, Discharged on 01/23/2022  Component Date Value Ref Range Status   Sodium 01/22/2022 139  135 - 145 mmol/L Final   Potassium 01/22/2022 4.2  3.5 - 5.1 mmol/L Final   Chloride 01/22/2022 102  98 - 111 mmol/L Final   CO2 01/22/2022 21 (L)  22 - 32 mmol/L Final   Glucose, Bld 01/22/2022 105 (H)  70 - 99 mg/dL Final   Glucose reference range applies only to samples taken after fasting for at least 8 hours.   BUN 01/22/2022 8  6 - 20 mg/dL Final   Creatinine, Ser 01/22/2022 0.72  0.61 - 1.24 mg/dL Final   Calcium 01/22/2022 9.7  8.9 - 10.3 mg/dL Final    Total Protein 01/22/2022 8.0  6.5 - 8.1 g/dL Final   Albumin 01/22/2022 4.1  3.5 - 5.0 g/dL Final   AST 01/22/2022 216 (H)  15 - 41 U/L Final   ALT 01/22/2022 115 (H)  0 - 44 U/L Final   Alkaline Phosphatase 01/22/2022 186 (H)  38 - 126 U/L Final   Total Bilirubin 01/22/2022 0.5  0.3 - 1.2 mg/dL Final   GFR, Estimated 01/22/2022 >60  >60 mL/min Final   Comment: (NOTE) Calculated using the CKD-EPI Creatinine Equation (2021)    Anion gap 01/22/2022 16 (H)  5 - 15 Final   Performed at [place] [address], [place], [place] [address]   WBC 01/22/2022 5.9  4.0 - 10.5 K/uL Final   RBC 01/22/2022 4.09 (L)  4.22 - 5.81 MIL/uL Final   Hemoglobin 01/22/2022 10.9 (L)  13.0 - 17.0 g/dL Final   HCT 01/22/2022 35.1 (L)  39.0 - 52.0 % Final   MCV 01/22/2022 85.8  80.0 - 100.0 fL Final   MCH 01/22/2022 26.7  26.0 - 34.0 pg Final   MCHC 01/22/2022 31.1  30.0 - 36.0 g/dL Final   RDW 01/22/2022 16.5 (H)  11.5 - 15.5 % Final   Platelets 01/22/2022 192  150 - 400 K/uL Final   nRBC 01/22/2022 0.0  0.0 - 0.2 % Final   Neutrophils Relative % 01/22/2022 43  % Final   Neutro Abs 01/22/2022 2.5  1.7 - 7.7 K/uL Final   Lymphocytes Relative 01/22/2022 38  % Final   Lymphs Abs 01/22/2022 2.2  0.7 - 4.0 K/uL Final   Monocytes Relative 01/22/2022 16  % Final   Monocytes Absolute 01/22/2022 0.9  0.1 - 1.0 K/uL Final   Eosinophils Relative 01/22/2022 2  % Final   Eosinophils Absolute 01/22/2022 0.1  0.0 - 0.5 K/uL Final   Basophils Relative 01/22/2022 1  % Final   Basophils Absolute 01/22/2022 0.1  0.0 - 0.1 K/uL Final   Immature Granulocytes 01/22/2022 0  % Final   Abs Immature Granulocytes 01/22/2022 0.02  0.00 - 0.07 K/uL Final   Performed at [facility], [place] [address], [place], [place] [address]   Alcohol, Ethyl (B) 01/22/2022 340 (HH)  <10 mg/dL Final   Comment: CRITICAL RESULT CALLED TO, READ BACK BY AND VERIFIED WITH Assunta Found, RN, 2232 01/22/22, A. RAMSEY (NOTE) Lowest detectable  limit for serum alcohol is 10 mg/dL.  For medical purposes only. Performed at [facility], [place] [address] [place], [place] [address]  Opiates 01/22/2022 NONE DETECTED  NONE DETECTED Final   Cocaine 01/22/2022 NONE DETECTED  NONE DETECTED Final   Benzodiazepines 01/22/2022 NONE DETECTED  NONE DETECTED Final   Amphetamines 01/22/2022 NONE DETECTED  NONE DETECTED Final   Tetrahydrocannabinol 01/22/2022 NONE DETECTED  NONE DETECTED Final   Barbiturates 01/22/2022 NONE DETECTED  NONE DETECTED Final   Comment: (NOTE) DRUG SCREEN FOR MEDICAL PURPOSES ONLY.  IF CONFIRMATION IS NEEDED FOR ANY PURPOSE, NOTIFY LAB WITHIN 5 DAYS.  LOWEST DETECTABLE LIMITS FOR URINE DRUG SCREEN Drug Class                     Cutoff (ng/mL) Amphetamine and metabolites    1000 Barbiturate and metabolites    200 Benzodiazepine                 200 Opiates and metabolites        300 Cocaine and metabolites        300 THC                            50 Performed at [facility], [place] [address], [place], [place] [address]    Color, Urine 01/22/2022 COLORLESS (A)  YELLOW Final   APPearance 01/22/2022 CLEAR  CLEAR Final   Specific Gravity, Urine 01/22/2022 1.002 (L)  1.005 - 1.030 Final   pH 01/22/2022 5.0  5.0 - 8.0 Final   Glucose, UA 01/22/2022 NEGATIVE  NEGATIVE mg/dL Final   Hgb urine dipstick 01/22/2022 NEGATIVE  NEGATIVE Final   Bilirubin Urine 01/22/2022 NEGATIVE  NEGATIVE Final   Ketones, ur 01/22/2022 NEGATIVE  NEGATIVE mg/dL Final   Protein, ur 01/22/2022 NEGATIVE  NEGATIVE mg/dL Final   Nitrite 01/22/2022 NEGATIVE  NEGATIVE Final   Leukocytes,Ua 01/22/2022 NEGATIVE  NEGATIVE Final   Performed at [place] [address] [place], [place] [address]  Admission on 01/19/2022, Discharged on 01/21/2022  Component Date Value Ref Range Status   SARS Coronavirus 2 by RT PCR 01/19/2022 NEGATIVE  NEGATIVE Final   Comment: (NOTE) SARS-CoV-2 target nucleic acids are NOT  DETECTED.  The SARS-CoV-2 RNA is generally detectable in upper respiratory specimens during the acute phase of infection. The lowest concentration of SARS-CoV-2 viral copies this assay can detect is 138 copies/mL. A negative result does not preclude SARS-Cov-2 infection and should not be used as the sole basis for treatment or other patient management decisions. A negative result may occur with  improper specimen collection/handling, submission of specimen other than nasopharyngeal swab, presence of viral mutation(s) within the areas targeted by this assay, and inadequate number of viral copies(<138 copies/mL). A negative result must be combined with clinical observations, patient history, and epidemiological information. The expected result is Negative.  Fact Sheet for Patients:  EntrepreneurPulse.com.au  Fact Sheet for Healthcare Providers:  [employer]  This test is no                          t yet approved or cleared by the [place] FDA and  has been authorized for detection and/or diagnosis of SARS-CoV-2 by FDA under an Emergency Use Authorization (EUA). This EUA will remain  in effect (meaning this test can be used) for the duration of the COVID-19 declaration under Section 564(b)(1) of the Act, 21 U.S.C.section 360bbb-3(b)(1), unless the authorization is terminated  or revoked sooner.       Influenza A by  PCR 01/19/2022 NEGATIVE  NEGATIVE Final   Influenza B by PCR 01/19/2022 NEGATIVE  NEGATIVE Final   Comment: (NOTE) The Xpert Xpress SARS-CoV-2/FLU/RSV plus assay is intended as an aid in the diagnosis of influenza from Nasopharyngeal swab specimens and should not be used as a sole basis for treatment. Nasal washings and aspirates are unacceptable for Xpert Xpress SARS-CoV-2/FLU/RSV testing.  Fact Sheet for Patients: EntrepreneurPulse.com.au  Fact Sheet for Healthcare  Providers: IncredibleEmployment.be  This test is not yet approved or cleared by the [place] FDA and has been authorized for detection and/or diagnosis of SARS-CoV-2 by FDA under an Emergency Use Authorization (EUA). This EUA will remain in effect (meaning this test can be used) for the duration of the COVID-19 declaration under Section 564(b)(1) of the Act, 21 U.S.C. section 360bbb-3(b)(1), unless the authorization is terminated or revoked.  Performed at [facility], [place] [address] [place], [place] [address]    WBC 01/19/2022 4.8  4.0 - 10.5 K/uL Final   RBC 01/19/2022 3.95 (L)  4.22 - 5.81 MIL/uL Final   Hemoglobin 01/19/2022 10.8 (L)  13.0 - 17.0 g/dL Final   HCT 01/19/2022 33.2 (L)  39.0 - 52.0 % Final   MCV 01/19/2022 84.1  80.0 - 100.0 fL Final   MCH 01/19/2022 27.3  26.0 - 34.0 pg Final   MCHC 01/19/2022 32.5  30.0 - 36.0 g/dL Final   RDW 01/19/2022 16.7 (H)  11.5 - 15.5 % Final   Platelets 01/19/2022 161  150 - 400 K/uL Final   nRBC 01/19/2022 0.0  0.0 - 0.2 % Final   Neutrophils Relative % 01/19/2022 40  % Final   Neutro Abs 01/19/2022 2.0  1.7 - 7.7 K/uL Final   Lymphocytes Relative 01/19/2022 38  % Final   Lymphs Abs 01/19/2022 1.8  0.7 - 4.0 K/uL Final   Monocytes Relative 01/19/2022 17  % Final   Monocytes Absolute 01/19/2022 0.8  0.1 - 1.0 K/uL Final   Eosinophils Relative 01/19/2022 3  % Final   Eosinophils Absolute 01/19/2022 0.1  0.0 - 0.5 K/uL Final   Basophils Relative 01/19/2022 2  % Final   Basophils Absolute 01/19/2022 0.1  0.0 - 0.1 K/uL Final   Immature Granulocytes 01/19/2022 0  % Final   Abs Immature Granulocytes 01/19/2022 0.02  0.00 - 0.07 K/uL Final   Performed at [facility], [place] [address], [place], [place] [address]   Sodium 01/19/2022 138  135 - 145 mmol/L Final   Potassium 01/19/2022 3.8  3.5 - 5.1 mmol/L Final   Chloride 01/19/2022 102  98 - 111 mmol/L Final   CO2 01/19/2022 22  22 - 32 mmol/L Final    Glucose, Bld 01/19/2022 89  70 - 99 mg/dL Final   Glucose reference range applies only to samples taken after fasting for at least 8 hours.   BUN 01/19/2022 7  6 - 20 mg/dL Final   Creatinine, Ser 01/19/2022 0.64  0.61 - 1.24 mg/dL Final   Calcium 01/19/2022 9.2  8.9 - 10.3 mg/dL Final   Total Protein 01/19/2022 8.1  6.5 - 8.1 g/dL Final   Albumin 01/19/2022 4.0  3.5 - 5.0 g/dL Final   AST 01/19/2022 140 (H)  15 - 41 U/L Final   ALT 01/19/2022 68 (H)  0 - 44 U/L Final   Alkaline Phosphatase 01/19/2022 144 (H)  38 - 126 U/L Final   Total Bilirubin 01/19/2022 0.4  0.3 - 1.2 mg/dL Final   GFR,  Estimated 01/19/2022 >60  >60 mL/min Final   Comment: (NOTE) Calculated using the CKD-EPI Creatinine Equation (2021)    Anion gap 01/19/2022 14  5 - 15 Final   Performed at [facility], [place] [address] [place], [place] [address]   Hgb A1c MFr Bld 01/19/2022 5.3  4.8 - 5.6 % Final   Comment: (NOTE) Pre diabetes:          5.7%-6.4%  Diabetes:              >6.4%  Glycemic control for   <7.0% adults with diabetes    Mean Plasma Glucose 01/19/2022 105.41  mg/dL Final   Performed at [facility], [place] [address] [place], [place] [address]   Alcohol, Ethyl (B) 01/19/2022 290 (H)  <10 mg/dL Final   Comment: (NOTE) Lowest detectable limit for serum alcohol is 10 mg/dL.  For medical purposes only. Performed at [facility], [place] [address] [place], [place] [address]    Cholesterol 01/19/2022 251 (H)  0 - 200 mg/dL Final   Triglycerides 01/19/2022 163 (H)  <150 mg/dL Final   HDL 01/19/2022 105  >40 mg/dL Final   Total CHOL/HDL Ratio 01/19/2022 2.4  RATIO Final   VLDL 01/19/2022 33  0 - 40 mg/dL Final   LDL Cholesterol 01/19/2022 113 (H)  0 - 99 mg/dL Final   Comment:        Total Cholesterol/HDL:CHD Risk Coronary Heart Disease Risk Table                     Men   Women  1/2 Average Risk   3.4   3.3  Average Risk       5.0   4.4  2 X Average Risk   9.6   7.1  3 X Average  Risk  23.4   11.0        Use the calculated Patient Ratio above and the CHD Risk Table to determine the patient's CHD Risk.        ATP III CLASSIFICATION (LDL):  <100     mg/dL   Optimal  100-129  mg/dL   Near or Above                    Optimal  130-159  mg/dL   Borderline  160-189  mg/dL   High  >190     mg/dL   Very High Performed at [place] [address], [place], [place] [address]    TSH 01/19/2022 0.415  0.350 - 4.500 uIU/mL Final   Comment: Performed by a 3rd Generation assay with a functional sensitivity of <=0.01 uIU/mL. Performed at [facility], [place] [address] [place], [place] [address]    POC Amphetamine UR 01/19/2022 None Detected  NONE DETECTED (Cut Off Level 1000 ng/mL) Final   POC Secobarbital (BAR) 01/19/2022 None Detected  NONE DETECTED (Cut Off Level 300 ng/mL) Final   POC Buprenorphine (BUP) 01/19/2022 None Detected  NONE DETECTED (Cut Off Level 10 ng/mL) Final   POC Oxazepam (BZO) 01/19/2022 None Detected  NONE DETECTED (Cut Off Level 300 ng/mL) Final   POC Cocaine UR 01/19/2022 None Detected  NONE DETECTED (Cut Off Level 300 ng/mL) Final   POC Methamphetamine UR 01/19/2022 None Detected  NONE DETECTED (Cut Off Level 1000 ng/mL) Final   POC Morphine 01/19/2022 None Detected  NONE DETECTED (Cut Off Level 300 ng/mL) Final   POC Methadone UR  01/19/2022 None Detected  NONE DETECTED (Cut Off Level 300 ng/mL) Final   POC Oxycodone UR 01/19/2022 None Detected  NONE DETECTED (Cut Off Level 100 ng/mL) Final   POC Marijuana UR 01/19/2022 None Detected  NONE DETECTED (Cut Off Level 50 ng/mL) Final   SARSCOV2ONAVIRUS 2 AG 01/19/2022 NEGATIVE  NEGATIVE Final   Comment: (NOTE) SARS-CoV-2 antigen NOT DETECTED.   Negative results are presumptive.  Negative results do not preclude SARS-CoV-2 infection and should not be used as the sole basis for treatment or other patient management decisions, including infection  control decisions, particularly in the  presence of clinical signs and  symptoms consistent with COVID-19, or in those who have been in contact with the virus.  Negative results must be combined with clinical observations, patient history, and epidemiological information. The expected result is Negative.  Fact Sheet for Patients: HandmadeRecipes.com.cy  Fact Sheet for Healthcare Providers: FuneralLife.at  This test is not yet approved or cleared by the [place] FDA and  has been authorized for detection and/or diagnosis of SARS-CoV-2 by FDA under an Emergency Use Authorization (EUA).  This EUA will remain in effect (meaning this test can be used) for the duration of  the COV                          ID-19 declaration under Section 564(b)(1) of the Act, 21 U.S.C. section 360bbb-3(b)(1), unless the authorization is terminated or revoked sooner.    Admission on 01/11/2022, Discharged on 01/12/2022  Component Date Value Ref Range Status   WBC 01/11/2022 6.0  4.0 - 10.5 K/uL Final   RBC 01/11/2022 3.96 (L)  4.22 - 5.81 MIL/uL Final   Hemoglobin 01/11/2022 11.1 (L)  13.0 - 17.0 g/dL Final   HCT 01/11/2022 33.1 (L)  39.0 - 52.0 % Final   MCV 01/11/2022 83.6  80.0 - 100.0 fL Final   MCH 01/11/2022 28.0  26.0 - 34.0 pg Final   MCHC 01/11/2022 33.5  30.0 - 36.0 g/dL Final   RDW 01/11/2022 17.0 (H)  11.5 - 15.5 % Final   Platelets 01/11/2022 115 (L)  150 - 400 K/uL Final   nRBC 01/11/2022 0.0  0.0 - 0.2 % Final   Neutrophils Relative % 01/11/2022 54  % Final   Neutro Abs 01/11/2022 3.3  1.7 - 7.7 K/uL Final   Lymphocytes Relative 01/11/2022 29  % Final   Lymphs Abs 01/11/2022 1.7  0.7 - 4.0 K/uL Final   Monocytes Relative 01/11/2022 14  % Final   Monocytes Absolute 01/11/2022 0.8  0.1 - 1.0 K/uL Final   Eosinophils Relative 01/11/2022 1  % Final   Eosinophils Absolute 01/11/2022 0.1  0.0 - 0.5 K/uL Final   Basophils Relative 01/11/2022 2  % Final   Basophils Absolute  01/11/2022 0.1  0.0 - 0.1 K/uL Final   Immature Granulocytes 01/11/2022 0  % Final   Abs Immature Granulocytes 01/11/2022 0.02  0.00 - 0.07 K/uL Final   Performed at [facility], [place] [address], [place], [place] [address]   Sodium 01/11/2022 136  135 - 145 mmol/L Final   Potassium 01/11/2022 3.7  3.5 - 5.1 mmol/L Final   Chloride 01/11/2022 101  98 - 111 mmol/L Final   CO2 01/11/2022 21 (L)  22 - 32 mmol/L Final   Glucose, Bld 01/11/2022 106 (H)  70 - 99 mg/dL Final   Glucose reference range applies only to samples taken after fasting for  at least 8 hours.   BUN 01/11/2022 5 (L)  6 - 20 mg/dL Final   Creatinine, Ser 01/11/2022 0.66  0.61 - 1.24 mg/dL Final   Calcium 01/11/2022 8.5 (L)  8.9 - 10.3 mg/dL Final   Total Protein 01/11/2022 8.0  6.5 - 8.1 g/dL Final   Albumin 01/11/2022 4.1  3.5 - 5.0 g/dL Final   AST 01/11/2022 149 (H)  15 - 41 U/L Final   ALT 01/11/2022 63 (H)  0 - 44 U/L Final   Alkaline Phosphatase 01/11/2022 131 (H)  38 - 126 U/L Final   Total Bilirubin 01/11/2022 0.7  0.3 - 1.2 mg/dL Final   GFR, Estimated 01/11/2022 >60  >60 mL/min Final   Comment: (NOTE) Calculated using the CKD-EPI Creatinine Equation (2021)    Anion gap 01/11/2022 14  5 - 15 Final   Performed at [facility] [address] [place], [place] [address]   Alcohol, Ethyl (B) 01/11/2022 351 (HH)  <10 mg/dL Final   Comment: CRITICAL RESULT CALLED TO, READ BACK BY AND VERIFIED WITH B. SANGALANG RN, 0014, 01/12/22, EADEDOKUN (NOTE) Lowest detectable limit for serum alcohol is 10 mg/dL.  For medical purposes only. Performed at [facility], [place] [address] [place], [place] [address]   Admission on 12/08/2021, Discharged on 12/09/2021  Component Date Value Ref Range Status   Sodium 12/08/2021 140  135 - 145 mmol/L Final   Potassium 12/08/2021 3.8  3.5 - 5.1 mmol/L Final   Chloride 12/08/2021 103  98 - 111 mmol/L Final   CO2 12/08/2021 24  22 - 32 mmol/L Final   Glucose, Bld  12/08/2021 93  70 - 99 mg/dL Final   Glucose reference range applies only to samples taken after fasting for at least 8 hours.   BUN 12/08/2021 5 (L)  6 - 20 mg/dL Final   Creatinine, Ser 12/08/2021 0.66  0.61 - 1.24 mg/dL Final   Calcium 12/08/2021 8.7 (L)  8.9 - 10.3 mg/dL Final   Total Protein 12/08/2021 8.0  6.5 - 8.1 g/dL Final   Albumin 12/08/2021 4.2  3.5 - 5.0 g/dL Final   AST 12/08/2021 176 (H)  15 - 41 U/L Final   ALT 12/08/2021 75 (H)  0 - 44 U/L Final   Alkaline Phosphatase 12/08/2021 152 (H)  38 - 126 U/L Final   Total Bilirubin 12/08/2021 0.7  0.3 - 1.2 mg/dL Final   GFR, Estimated 12/08/2021 >60  >60 mL/min Final   Comment: (NOTE) Calculated using the CKD-EPI Creatinine Equation (2021)    Anion gap 12/08/2021 13  5 - 15 Final   Performed at [facility], [place] [address], [place], [place] [address]   Alcohol, Ethyl (B) 12/08/2021 372 (HH)  <10 mg/dL Final   Comment: CRITICAL RESULT CALLED TO, READ BACK BY AND VERIFIED WITH RIVERS,C AT 2216 ON 12/08/21 BY [email or web address] (NOTE) Lowest detectable limit for serum alcohol is 10 mg/dL.  For medical purposes only. Performed at [facility], [place] [address], [place], [place] [address]    Salicylate Lvl 44/03/270 <7.0 (L)  7.0 - 30.0 mg/dL Final   Performed at [facility] [address] [place], [place] [address]   Acetaminophen (Tylenol), Serum 12/08/2021 <10 (L)  10 - 30 ug/mL Final   Comment: (NOTE) Therapeutic concentrations vary significantly. A range of 10-30 ug/mL  may be an effective concentration for many patients. However, some  are best treated at concentrations outside of this  range. Acetaminophen concentrations >150 ug/mL at 4 hours after ingestion  and >50 ug/mL at 12 hours after ingestion are often associated with  toxic reactions.  Performed at [facility], [place] [address], [place], [place] [address]    WBC 12/08/2021 4.0  4.0 - 10.5  K/uL Final   RBC 12/08/2021 4.26  4.22 - 5.81 MIL/uL Final   Hemoglobin 12/08/2021 11.6 (L)  13.0 - 17.0 g/dL Final   HCT 12/08/2021 36.1 (L)  39.0 - 52.0 % Final   MCV 12/08/2021 84.7  80.0 - 100.0 fL Final   MCH 12/08/2021 27.2  26.0 - 34.0 pg Final   MCHC 12/08/2021 32.1  30.0 - 36.0 g/dL Final   RDW 12/08/2021 17.2 (H)  11.5 - 15.5 % Final   Platelets 12/08/2021 120 (L)  150 - 400 K/uL Final   nRBC 12/08/2021 0.0  0.0 - 0.2 % Final   Performed at [facility], [place] [address], [place], [place] [address]   Opiates 12/08/2021 NONE DETECTED  NONE DETECTED Final   Cocaine 12/08/2021 NONE DETECTED  NONE DETECTED Final   Benzodiazepines 12/08/2021 NONE DETECTED  NONE DETECTED Final   Amphetamines 12/08/2021 NONE DETECTED  NONE DETECTED Final   Tetrahydrocannabinol 12/08/2021 NONE DETECTED  NONE DETECTED Final   Barbiturates 12/08/2021 NONE DETECTED  NONE DETECTED Final   Comment: (NOTE) DRUG SCREEN FOR MEDICAL PURPOSES ONLY.  IF CONFIRMATION IS NEEDED FOR ANY PURPOSE, NOTIFY LAB WITHIN 5 DAYS.  LOWEST DETECTABLE LIMITS FOR URINE DRUG SCREEN Drug Class                     Cutoff (ng/mL) Amphetamine and metabolites    1000 Barbiturate and metabolites    200 Benzodiazepine                 132 Tricyclics and metabolites     300 Opiates and metabolites        300 Cocaine and metabolites        300 THC                            50 Performed at [facility], [place] [address], [place], [place] [address]   Admission on 12/06/2021, Discharged on 12/07/2021  Component Date Value Ref Range Status   Sodium 12/06/2021 138  135 - 145 mmol/L Final   Potassium 12/06/2021 3.6  3.5 - 5.1 mmol/L Final   Chloride 12/06/2021 103  98 - 111 mmol/L Final   CO2 12/06/2021 26  22 - 32 mmol/L Final   Glucose, Bld 12/06/2021 94  70 - 99 mg/dL Final   Glucose reference range applies only to samples taken after fasting for at least 8 hours.   BUN 12/06/2021 <5 (L)  6 -  20 mg/dL Final   Creatinine, Ser 12/06/2021 0.77  0.61 - 1.24 mg/dL Final   Calcium 12/06/2021 9.0  8.9 - 10.3 mg/dL Final   Total Protein 12/06/2021 8.2 (H)  6.5 - 8.1 g/dL Final   Albumin 12/06/2021 4.1  3.5 - 5.0 g/dL Final   AST 12/06/2021 149 (H)  15 - 41 U/L Final   ALT 12/06/2021 66 (H)  0 - 44 U/L Final   Alkaline Phosphatase 12/06/2021 147 (H)  38 - 126 U/L Final   Total Bilirubin 12/06/2021 0.6  0.3 - 1.2 mg/dL Final   GFR, Estimated 12/06/2021 >60  >60 mL/min Final   Comment: (  NOTE) Calculated using the CKD-EPI Creatinine Equation (2021)    Anion gap 12/06/2021 9  5 - 15 Final   Performed at [facility], [place] [address], [place], [place] [address]   Alcohol, Ethyl (B) 12/06/2021 380 (HH)  <10 mg/dL Final   Comment: CRITICAL RESULT CALLED TO, READ BACK BY AND VERIFIED WITH GESELL, K @ [id number] (NOTE) Lowest detectable limit for serum alcohol is 10 mg/dL.  For medical purposes only. Performed at [facility], [place] [address] [place], [place] [address]    WBC 12/06/2021 4.8  4.0 - 10.5 K/uL Final   RBC 12/06/2021 4.41  4.22 - 5.81 MIL/uL Final   Hemoglobin 12/06/2021 12.1 (L)  13.0 - 17.0 g/dL Final   HCT 12/06/2021 37.9 (L)  39.0 - 52.0 % Final   MCV 12/06/2021 85.9  80.0 - 100.0 fL Final   MCH 12/06/2021 27.4  26.0 - 34.0 pg Final   MCHC 12/06/2021 31.9  30.0 - 36.0 g/dL Final   RDW 12/06/2021 17.2 (H)  11.5 - 15.5 % Final   Platelets 12/06/2021 114 (L)  150 - 400 K/uL Final   nRBC 12/06/2021 0.0  0.0 - 0.2 % Final   Performed at [facility], [place] [address], [place], [place] [address]   Magnesium 12/06/2021 2.2  1.7 - 2.4 mg/dL Final   Performed at [facility] [address] [place], [place] [address]  Admission on 11/18/2021, Discharged on 11/18/2021  Component Date Value Ref Range Status   WBC 11/18/2021 4.5  4.0 - 10.5 K/uL Final   RBC 11/18/2021 4.24  4.22 - 5.81 MIL/uL Final    Hemoglobin 11/18/2021 11.7 (L)  13.0 - 17.0 g/dL Final   HCT 11/18/2021 36.1 (L)  39.0 - 52.0 % Final   MCV 11/18/2021 85.1  80.0 - 100.0 fL Final   MCH 11/18/2021 27.6  26.0 - 34.0 pg Final   MCHC 11/18/2021 32.4  30.0 - 36.0 g/dL Final   RDW 11/18/2021 16.4 (H)  11.5 - 15.5 % Final   Platelets 11/18/2021 107 (L)  150 - 400 K/uL Final   nRBC 11/18/2021 0.0  0.0 - 0.2 % Final   Neutrophils Relative % 11/18/2021 43  % Final   Neutro Abs 11/18/2021 2.0  1.7 - 7.7 K/uL Final   Lymphocytes Relative 11/18/2021 40  % Final   Lymphs Abs 11/18/2021 1.8  0.7 - 4.0 K/uL Final   Monocytes Relative 11/18/2021 12  % Final   Monocytes Absolute 11/18/2021 0.5  0.1 - 1.0 K/uL Final   Eosinophils Relative 11/18/2021 3  % Final   Eosinophils Absolute 11/18/2021 0.1  0.0 - 0.5 K/uL Final   Basophils Relative 11/18/2021 2  % Final   Basophils Absolute 11/18/2021 0.1  0.0 - 0.1 K/uL Final   Immature Granulocytes 11/18/2021 0  % Final   Abs Immature Granulocytes 11/18/2021 0.01  0.00 - 0.07 K/uL Final   Performed at [facility], [place] [address] [place], [place] [address]   Sodium 11/18/2021 140  135 - 145 mmol/L Final   Potassium 11/18/2021 3.5  3.5 - 5.1 mmol/L Final   Chloride 11/18/2021 108  98 - 111 mmol/L Final   CO2 11/18/2021 21 (L)  22 - 32 mmol/L Final   Glucose, Bld 11/18/2021 90  70 - 99 mg/dL Final   Glucose reference range applies only to samples taken after fasting for at least 8 hours.   BUN 11/18/2021 <  5 (L)  6 - 20 mg/dL Final   Creatinine, Ser 11/18/2021 0.71  0.61 - 1.24 mg/dL Final   Calcium 11/18/2021 8.4 (L)  8.9 - 10.3 mg/dL Final   Total Protein 11/18/2021 7.4  6.5 - 8.1 g/dL Final   Albumin 11/18/2021 3.7  3.5 - 5.0 g/dL Final   AST 11/18/2021 91 (H)  15 - 41 U/L Final   ALT 11/18/2021 48 (H)  0 - 44 U/L Final   Alkaline Phosphatase 11/18/2021 178 (H)  38 - 126 U/L Final   Total Bilirubin 11/18/2021 0.5  0.3 - 1.2 mg/dL Final   GFR, Estimated 11/18/2021 >60  >60  mL/min Final   Comment: (NOTE) Calculated using the CKD-EPI Creatinine Equation (2021)    Anion gap 11/18/2021 11  5 - 15 Final   Performed at [facility] [address], [place], [place] [address]   Alcohol, Ethyl (B) 11/18/2021 336 (HH)  <10 mg/dL Final   Comment: CRITICAL RESULT CALLED TO, READ BACK BY AND VERIFIED WITH DESMOND JAY,RN AT 1455 11/18/2021 BY ZBEECH. (NOTE) Lowest detectable limit for serum alcohol is 10 mg/dL.  For medical purposes only. Performed at [facility], [place] [address], [place], [place] [address]    Glucose-Capillary 11/18/2021 98  70 - 99 mg/dL Final   Glucose reference range applies only to samples taken after fasting for at least 8 hours.   Comment 1 11/18/2021 Notify RN   Final   Comment 2 11/18/2021 Document in Chart   Final  Admission on 11/16/2021, Discharged on 11/16/2021  Component Date Value Ref Range Status   WBC 11/16/2021 4.1  4.0 - 10.5 K/uL Final   RBC 11/16/2021 4.20 (L)  4.22 - 5.81 MIL/uL Final   Hemoglobin 11/16/2021 11.5 (L)  13.0 - 17.0 g/dL Final   HCT 11/16/2021 35.3 (L)  39.0 - 52.0 % Final   MCV 11/16/2021 84.0  80.0 - 100.0 fL Final   MCH 11/16/2021 27.4  26.0 - 34.0 pg Final   MCHC 11/16/2021 32.6  30.0 - 36.0 g/dL Final   RDW 11/16/2021 16.3 (H)  11.5 - 15.5 % Final   Platelets 11/16/2021 127 (L)  150 - 400 K/uL Final   nRBC 11/16/2021 0.0  0.0 - 0.2 % Final   Neutrophils Relative % 11/16/2021 38  % Final   Neutro Abs 11/16/2021 1.5 (L)  1.7 - 7.7 K/uL Final   Lymphocytes Relative 11/16/2021 45  % Final   Lymphs Abs 11/16/2021 1.9  0.7 - 4.0 K/uL Final   Monocytes Relative 11/16/2021 12  % Final   Monocytes Absolute 11/16/2021 0.5  0.1 - 1.0 K/uL Final   Eosinophils Relative 11/16/2021 3  % Final   Eosinophils Absolute 11/16/2021 0.1  0.0 - 0.5 K/uL Final   Basophils Relative 11/16/2021 2  % Final   Basophils Absolute 11/16/2021 0.1  0.0 - 0.1 K/uL Final   Immature Granulocytes 11/16/2021 0  % Final   Abs  Immature Granulocytes 11/16/2021 0.01  0.00 - 0.07 K/uL Final   Performed at [facility], [place] [address] [place], [place] [address]   Sodium 11/16/2021 140  135 - 145 mmol/L Final   Potassium 11/16/2021 3.7  3.5 - 5.1 mmol/L Final   Chloride 11/16/2021 107  98 - 111 mmol/L Final   CO2 11/16/2021 22  22 - 32 mmol/L Final   Glucose, Bld 11/16/2021 93  70 - 99 mg/dL Final   Glucose reference range applies only  to samples taken after fasting for at least 8 hours.   BUN 11/16/2021 <5 (L)  6 - 20 mg/dL Final   Creatinine, Ser 11/16/2021 0.77  0.61 - 1.24 mg/dL Final   Calcium 11/16/2021 8.8 (L)  8.9 - 10.3 mg/dL Final   Total Protein 11/16/2021 7.8  6.5 - 8.1 g/dL Final   Albumin 11/16/2021 4.0  3.5 - 5.0 g/dL Final   AST 11/16/2021 103 (H)  15 - 41 U/L Final   ALT 11/16/2021 52 (H)  0 - 44 U/L Final   Alkaline Phosphatase 11/16/2021 197 (H)  38 - 126 U/L Final   Total Bilirubin 11/16/2021 0.4  0.3 - 1.2 mg/dL Final   GFR, Estimated 11/16/2021 >60  >60 mL/min Final   Comment: (NOTE) Calculated using the CKD-EPI Creatinine Equation (2021)    Anion gap 11/16/2021 11  5 - 15 Final   Performed at [facility], [place] [address], [place], [place] [address]  Admission on 11/08/2021, Discharged on 11/09/2021  Component Date Value Ref Range Status   Glucose-Capillary 11/08/2021 90  70 - 99 mg/dL Final   Glucose reference range applies only to samples taken after fasting for at least 8 hours.  There may be more visits with results that are not included.    Blood Alcohol level:  Lab Results  Component Value Date   ETH 336 (HH) 01/26/2022   ETH 268 (H) 62/70/3500    Metabolic Disorder Labs: Lab Results  Component Value Date   HGBA1C 5.3 01/19/2022   MPG 105.41 01/19/2022   MPG 116.89 07/17/2021   Lab Results  Component Value Date   PROLACTIN 23.2 (H) 03/07/2016   Lab Results  Component Value Date   CHOL 251 (H) 01/19/2022   TRIG 163 (H) 01/19/2022   HDL 105 01/19/2022    CHOLHDL 2.4 01/19/2022   VLDL 33 01/19/2022   LDLCALC 113 (H) 01/19/2022   LDLCALC 66 07/17/2021    Therapeutic Lab Levels: No results found for: "LITHIUM" No results found for: "VALPROATE" No results found for: "CBMZ"  Physical Findings   AIMS    Flowsheet Row Admission (Discharged) from 07/16/2017 in [place] [facility] Admission (Discharged) from 05/15/2017 in [place] [facility] Admission (Discharged) from 07/06/2016 in [place] [facility] Admission (Discharged) from 06/15/2016 in [place] [facility] Admission (Discharged) from 03/05/2016 in [place] [facility]B  AIMS Total Score 0 0 0 0 0      AUDIT    Flowsheet Row Admission (Discharged) from 07/16/2021 in [place] [facility] Admission (Discharged) from 07/16/2017 in [place] [facility] Admission (Discharged) from 05/15/2017 in [place] 300B Admission (Discharged) from 07/06/2016 in [place] 300B Admission (Discharged) from 06/15/2016 in [place] 400B  Alcohol Use Disorder Identification Test Final Score (AUDIT) 23 40 30 36 [place] Office Visit from 02/01/2019 in [place]  Total GAD-7 Score 7      PHQ2-9    Flowsheet Row ED from 01/27/2022 in [facility] ED from 01/19/2022 in [facility] ED from 08/07/2021 in [place] DEPT ED from 09/04/2020 in [place] ED from 06/17/2020 in [place] DEPT  PHQ-2 Total Score 6 2 4  4 4  PHQ-9 Total Score '23 2 14 18 21      '$ Flowsheet Row ED from 01/27/2022 in [facility] ED from  01/26/2022 in [place] DEPT ED from 01/24/2022 in [place] DEPT  C-SSRS RISK CATEGORY No Risk High Risk High Risk        Musculoskeletal  Strength & Muscle Tone: within normal limits Gait & Station: normal Patient leans: N/A  Psychiatric Specialty Exam  Presentation  General Appearance:  Appropriate for Environment; Casual  Eye Contact: Good  Speech: Clear and Coherent; Normal Rate  Speech Volume: Normal  Handedness: Right   Mood and Affect  Mood: Euthymic  Affect: Congruent; Appropriate   Thought Process  Thought Processes: Coherent; Goal Directed  Descriptions of Associations:Intact  Orientation:Full (Time, Place and Person)  Thought Content:Logical; WDL  Diagnosis of Schizophrenia or Schizoaffective disorder in past: No    Hallucinations:Hallucinations: None  Ideas of Reference:None  Suicidal Thoughts:Suicidal Thoughts: No  Homicidal Thoughts:Homicidal Thoughts: No   Sensorium  Memory: Immediate Fair; Recent Fair  Judgment: Good  Insight: Good   Executive Functions  Concentration: Good  Attention Span: Good  Recall: Good  Fund of Knowledge: Good  Language: Good   Psychomotor Activity  Psychomotor Activity: Psychomotor Activity: Normal   Assets  Assets: Communication Skills; Desire for Improvement; Financial Resources/Insurance; Resilience   Sleep  Sleep: Sleep: Good   No data recorded  Physical Exam  Physical Exam Vitals and nursing note reviewed.  Constitutional:      General: He is not in acute distress.    Appearance: Normal appearance. He is normal weight. He is not ill-appearing, toxic-appearing or diaphoretic.  HENT:     Head: Normocephalic and atraumatic.  Pulmonary:     Effort: Pulmonary effort is normal.  Musculoskeletal:        General: Normal range of motion.  Neurological:     General: No focal deficit present.     Mental  Status: He is alert.    Review of Systems  Respiratory:  Negative for cough and shortness of breath.   Cardiovascular:  Negative for chest pain.  Gastrointestinal:  Negative for abdominal pain, constipation, diarrhea, nausea and vomiting.  Neurological:  Positive for dizziness (mild) and tremors (mild). Negative for weakness and headaches.  Psychiatric/Behavioral:  Negative for depression, hallucinations and suicidal ideas. The patient is not nervous/anxious and does not have insomnia.    Blood pressure 132/70, pulse 89, temperature 98.9 F (37.2 C), temperature source Oral, resp. rate 19, SpO2 100 %. There is no height or weight on file to calculate BMI.  Treatment Plan Summary: Daily contact with patient to assess and evaluate symptoms and progress in treatment and Medication management  Barry Moore is a 57 y.o. male who presented to [facility] on 11/8 with initial complaints of SI/HI but reporting significant EtOH abuse, he was admitted to [facility] on 11/9 for Detox and Residential Placement.  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    Barry Moore is tolerating detox well with minimal withdrawal symptoms that are improving.  Given his cravings he could benefit from Naltrexone, his AST and ALT were elevated on admission so we will check and if normalizing can consider starting Naltrexone tomorrow.  We will redraw a CMP and CBC to monitor.  We will not make any medication changes at this time.  We will continue to monitor.  Social Work will assist  him with Residential placement tomorrow.   Withdrawal: -Continue CIWA, last score= 0  at 06:13  11/12 -Continue Ativan 1 mg q6 PRN CIWA>10 -Continue Imodium 2-4 mg PRN diarrhea -Continue Zofran-ODT 4 mg q6 PRN nausea -Continue Thiamine 100 mg daily for nutritional supplementation -Continue Multivitamin daily for nutritional supplementation      Depression: -Continue Celexa 10 mg daily -Continue Remeron 15 mg QHS -Continue Gabapentin 300 mg TID.     -Continue PRN's: Tylenol, Maalox, Atarax, Milk of Magnesia       Dispo: [place], MD 01/31/2022 7:54 AM

## 2022-01-31 NOTE — ED Notes (Signed)
Pt sitting in dining room watching TV. A&O x4, calm and cooperative. Denies current SI/HI/AVH. No signs of distress noted. Monitoring for safety.

## 2022-01-31 NOTE — ED Notes (Signed)
Pt sleeping. No noted distress. Will continue to monitor for safety

## 2022-01-31 NOTE — ED Notes (Signed)
Pt in dayroom eating dinner. No acute distress noted. Environment secured. Will continue to monitor for safety.

## 2022-02-01 DIAGNOSIS — F1010 Alcohol abuse, uncomplicated: Secondary | ICD-10-CM | POA: Diagnosis not present

## 2022-02-01 DIAGNOSIS — D509 Iron deficiency anemia, unspecified: Secondary | ICD-10-CM | POA: Diagnosis not present

## 2022-02-01 DIAGNOSIS — F332 Major depressive disorder, recurrent severe without psychotic features: Secondary | ICD-10-CM | POA: Diagnosis not present

## 2022-02-01 DIAGNOSIS — K709 Alcoholic liver disease, unspecified: Secondary | ICD-10-CM | POA: Diagnosis not present

## 2022-02-01 LAB — HEPATITIS PANEL, ACUTE
HCV Ab: NONREACTIVE
Hep A IgM: NONREACTIVE
Hep B C IgM: NONREACTIVE
Hepatitis B Surface Ag: NONREACTIVE

## 2022-02-01 LAB — HEPATIC FUNCTION PANEL
ALT: 128 U/L — ABNORMAL HIGH (ref 0–44)
AST: 118 U/L — ABNORMAL HIGH (ref 15–41)
Albumin: 3.9 g/dL (ref 3.5–5.0)
Alkaline Phosphatase: 116 U/L (ref 38–126)
Bilirubin, Direct: 0.1 mg/dL (ref 0.0–0.2)
Indirect Bilirubin: 0.1 mg/dL — ABNORMAL LOW (ref 0.3–0.9)
Total Bilirubin: 0.2 mg/dL — ABNORMAL LOW (ref 0.3–1.2)
Total Protein: 7.7 g/dL (ref 6.5–8.1)

## 2022-02-01 LAB — IRON AND TIBC
Iron: 66 ug/dL (ref 45–182)
Saturation Ratios: 11 % — ABNORMAL LOW (ref 17.9–39.5)
TIBC: 613 ug/dL — ABNORMAL HIGH (ref 250–450)
UIBC: 547 ug/dL

## 2022-02-01 LAB — HIV ANTIBODY (ROUTINE TESTING W REFLEX): HIV Screen 4th Generation wRfx: NONREACTIVE

## 2022-02-01 NOTE — Discharge Planning (Signed)
LCSW and MD spoke with patient on today regarding plans at discharge. Patient aware the limited options available to him at this time. Patient reports he is not interested in going to the [facility] as he does not want to cut his beard nor have limited access to his family for 90days. Patient reports he spoke with [facility] regarding their admission process and was informed that he would need to present to the facility in person for admission. Patient reports he would like to keep exploring other placement for him at this time, as he would prefer not to commit to their one year program. LCSW informed patient that LCSW followed up with [facility] regarding referral process, and was informed by [place] that the patient would need to have a [place] license. Patient reports he has a [place] license which would be another barrier for the patient. Patient asked if LCSW could follow up with a facility out in [place], as he reports he spoke to a representative with [facility] that stated they would need referral faxed over. LCSW followed up with agency [phone] and spoke with [facility] regarding referral process. Per Danton Clap, referral needs to be faxed to [phone] for review and follow up will be provided once received. Referral has been faxed.   LCSW also received phone call back from [facility] in [place] and spoke with [place]. Per [place], patient is not in their catchment area. Mrs. Dee provided LCSW with a contact number for [facility] [phone] as patient is in their catchment area. LCSW attempted to get in contact with agency, however received no answer. Unable to leave voice message as voicemail box was full. LCSW will continue to follow up and explore options for placement.   Lucius Conn, LCSW Clinical Social Worker [facility] Ph: [phone]

## 2022-02-01 NOTE — ED Provider Notes (Signed)
Behavioral Health Progress Note  Date and Time: 02/01/2022 12:11 PM Name: Barry Moore MRN:  [id number]  Subjective:   Barry Moore is a 56 y.o. male who presented to [facility] on 11/8 with initial complaints of SI/HI but reporting significant EtOH abuse, he was admitted to [facility] on 11/9 for Detox and Residential Placement.  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   Stated that his tremors have resolved, which he is relieved for.  Reported cravings for alcohol, that are worse during the morning and late evening.  Stated that it is relieved with coffee in the morning.  Reported that he has no coping mechanism for cravings in the evening.  He was amenable to trying cold or hot showers.  Stated that his dizziness has improved since admission, however has not changed since yesterday.  Stated that it is mild, denied falls.  Denied medication side effects.  Patient was amenable to further lab draws after learning his liver enzymes are high.  Gave verbal permission for HIV, hepatitis, RPR labs.  As well as lab draws for hepatic function panel.  Patient has a [place] I D, however he plans to call his sister to assist with residential in [place].  Otherwise patient had no other questions or concerns.  SI: Suicidal Thoughts: No (contracted to safety) SI Passive Intent and/or Plan:  (Denied) HI: Homicidal Thoughts: No AVH: Hallucinations: None (Denied AVH) Ideas of HBZ:JIRC   Mood: Euthymic Sleep:Good Appetite: Good   Diagnosis:  Final diagnoses:  Alcohol abuse  Homelessness  Depressed affect    Total Time spent with patient: 35 minutes  Past Psychiatric History: Depression and EtOH Abuse complicated by pancreatitis, GI bleed, thrombocytopenia, hypertension, homelessness.  Past Medical History:  Past Medical History:  Diagnosis Date   Alcoholic hepatitis    Depression    ETOH abuse     Gastric bleed 08/2018   Hypertension    Pancreatitis    Seizures ([place])    Suicidal behavior     Past Surgical History:  Procedure Laterality Date   BIOPSY  08/05/2018   Procedure: BIOPSY;  Surgeon: Irving Copas., MD;  Location: [facility] ENDOSCOPY;  Service: Gastroenterology;;   BIOPSY  08/07/2018   Procedure: BIOPSY;  Surgeon: Thornton Park, MD;  Location: [facility] ENDOSCOPY;  Service: Gastroenterology;;   BIOPSY  01/02/2019   Procedure: BIOPSY;  Surgeon: Gatha Mayer, MD;  Location: [facility] ENDOSCOPY;  Service: Endoscopy;;   COLONOSCOPY WITH PROPOFOL N/A 08/07/2018   Procedure: COLONOSCOPY WITH PROPOFOL;  Surgeon: Thornton Park, MD;  Location: [facility];  Service: Gastroenterology;  Laterality: N/A;   ESOPHAGOGASTRODUODENOSCOPY N/A 01/02/2019   Procedure: ESOPHAGOGASTRODUODENOSCOPY (EGD);  Surgeon: Gatha Mayer, MD;  Location: [facility] ENDOSCOPY;  Service: Endoscopy;  Laterality: N/A;   ESOPHAGOGASTRODUODENOSCOPY N/A 07/03/2019   Procedure: ESOPHAGOGASTRODUODENOSCOPY (EGD);  Surgeon: Wonda Horner, MD;  Location: [facility] ENDOSCOPY;  Service: Endoscopy;  Laterality: N/A;   ESOPHAGOGASTRODUODENOSCOPY N/A 06/26/2020   Procedure: ESOPHAGOGASTRODUODENOSCOPY (EGD);  Surgeon: Arta Silence, MD;  Location: [facility] ENDOSCOPY;  Service: Endoscopy;  Laterality: N/A;   ESOPHAGOGASTRODUODENOSCOPY (EGD) WITH PROPOFOL N/A 11/02/2016   Procedure: ESOPHAGOGASTRODUODENOSCOPY (EGD) WITH PROPOFOL;  Surgeon: Carol Ada, MD;  Location: [facility] ENDOSCOPY;  Service: Endoscopy;  Laterality: N/A;   ESOPHAGOGASTRODUODENOSCOPY (EGD) WITH PROPOFOL N/A 08/05/2018   Procedure: ESOPHAGOGASTRODUODENOSCOPY (EGD) WITH PROPOFOL;  Surgeon: Rush Landmark Telford Nab., MD;  Location: [facility];  Service: Gastroenterology;  Laterality: N/A;   ESOPHAGOGASTRODUODENOSCOPY (EGD) WITH PROPOFOL N/A 07/14/2019  Procedure: ESOPHAGOGASTRODUODENOSCOPY (EGD) WITH PROPOFOL;  Surgeon: Otis Brace, MD;  Location: [facility] ENDOSCOPY;  Service:  Gastroenterology;  Laterality: N/A;   HERNIA REPAIR     LEG SURGERY     POLYPECTOMY  08/07/2018   Procedure: POLYPECTOMY;  Surgeon: Thornton Park, MD;  Location: [facility] ENDOSCOPY;  Service: Gastroenterology;;   Family History:  Family History  Problem Relation Age of Onset   Diabetes Mother    Alcoholism Mother    Emphysema Father    Lung cancer Father    Alcoholism Father    Family Psychiatric  History: Father- EtOH Abuse Mother- EtOH Abuse Social History:  Social History   Substance and Sexual Activity  Alcohol Use Yes   Comment: 5+ BEERS DAILY, Patient does not know how much he drank tonight     Social History   Substance and Sexual Activity  Drug Use Not Currently   Frequency: 3.0 times per week   Types: Cocaine    Social History   Socioeconomic History   Marital status: Divorced    Spouse name: Not on file   Number of children: Not on file   Years of education: Not on file   Highest education level: Not on file  Occupational History   Not on file  Tobacco Use   Smoking status: Every Day    Packs/day: 1.00    Types: Cigarettes   Smokeless tobacco: Never  Vaping Use   Vaping Use: Never used  Substance and Sexual Activity   Alcohol use: Yes    Comment: 5+ BEERS DAILY, Patient does not know how much he drank tonight   Drug use: Not Currently    Frequency: 3.0 times per week    Types: Cocaine   Sexual activity: Not Currently  Other Topics Concern   Not on file  Social History Narrative   Not on file   Social Determinants of Health   Financial Resource Strain: Not on file  Food Insecurity: Not on file  Transportation Needs: Not on file  Physical Activity: Not on file  Stress: Not on file  Social Connections: Not on file   SDOH:  SDOH Screenings   Alcohol Screen: High Risk (07/16/2021)  Depression (PHQ2-9): High Risk (01/28/2022)  Tobacco Use: High Risk (01/26/2022)   Additional Social History:                         Current  Medications:  Current Facility-Administered Medications  Medication Dose Route Frequency Provider Last Rate Last Admin   acetaminophen (TYLENOL) tablet 650 mg  650 mg Oral Q6H PRN Evette Georges, NP       alum & mag hydroxide-simeth (MAALOX/MYLANTA) 200-200-20 MG/5ML suspension 30 mL  30 mL Oral Q4H PRN Evette Georges, NP       citalopram (CELEXA) tablet 10 mg  10 mg Oral Daily Evette Georges, NP   10 mg at 02/01/22 9381   gabapentin (NEURONTIN) capsule 300 mg  300 mg Oral TID Evette Georges, NP   300 mg at 02/01/22 0906   magnesium hydroxide (MILK OF MAGNESIA) suspension 30 mL  30 mL Oral Daily PRN Evette Georges, NP       mirtazapine (REMERON) tablet 15 mg  15 mg Oral QHS Derrill Center, NP   15 mg at 01/31/22 2100   multivitamin with minerals tablet 1 tablet  1 tablet Oral Daily Evette Georges, NP   1 tablet at 02/01/22 0906   thiamine (VITAMIN B1) tablet 100 mg  100 mg Oral Daily Evette Georges, NP   100 mg at 02/01/22 7371   Current Outpatient Medications  Medication Sig Dispense Refill   citalopram (CELEXA) 10 MG tablet Take 1 tablet (10 mg total) by mouth daily. (Patient not taking: Reported on 01/26/2022) 7 tablet 0   gabapentin (NEURONTIN) 300 MG capsule Take 1 capsule (300 mg total) by mouth 3 (three) times daily. (Patient not taking: Reported on 01/26/2022) 90 capsule 0   Multiple Vitamin (MULTIVITAMIN WITH MINERALS) TABS tablet Take 1 tablet by mouth daily. (Patient not taking: Reported on 01/26/2022) 30 tablet 0   thiamine (VITAMIN B-1) 100 MG tablet Take 1 tablet (100 mg total) by mouth daily. 30 tablet 0    Labs  Lab Results:  Admission on 01/27/2022  Component Date Value Ref Range Status   Sodium 01/31/2022 136  135 - 145 mmol/L Final   Potassium 01/31/2022 4.1  3.5 - 5.1 mmol/L Final   Chloride 01/31/2022 98  98 - 111 mmol/L Final   CO2 01/31/2022 27  22 - 32 mmol/L Final   Glucose, Bld 01/31/2022 136 (H)  70 - 99 mg/dL Final   Glucose reference range applies only to samples  taken after fasting for at least 8 hours.   BUN 01/31/2022 10  6 - 20 mg/dL Final   Creatinine, Ser 01/31/2022 1.04  0.61 - 1.24 mg/dL Final   Calcium 01/31/2022 9.5  8.9 - 10.3 mg/dL Final   Total Protein 01/31/2022 7.6  6.5 - 8.1 g/dL Final   Albumin 01/31/2022 4.0  3.5 - 5.0 g/dL Final   AST 01/31/2022 148 (H)  15 - 41 U/L Final   ALT 01/31/2022 140 (H)  0 - 44 U/L Final   Alkaline Phosphatase 01/31/2022 132 (H)  38 - 126 U/L Final   Total Bilirubin 01/31/2022 0.3  0.3 - 1.2 mg/dL Final   GFR, Estimated 01/31/2022 >60  >60 mL/min Final   Comment: (NOTE) Calculated using the CKD-EPI Creatinine Equation (2021)    Anion gap 01/31/2022 11  5 - 15 Final   Performed at [facility] [address], [place], [place] [address]   WBC 01/31/2022 5.4  4.0 - 10.5 K/uL Final   RBC 01/31/2022 3.97 (L)  4.22 - 5.81 MIL/uL Final   Hemoglobin 01/31/2022 10.8 (L)  13.0 - 17.0 g/dL Final   HCT 01/31/2022 34.3 (L)  39.0 - 52.0 % Final   MCV 01/31/2022 86.4  80.0 - 100.0 fL Final   MCH 01/31/2022 27.2  26.0 - 34.0 pg Final   MCHC 01/31/2022 31.5  30.0 - 36.0 g/dL Final   RDW 01/31/2022 15.9 (H)  11.5 - 15.5 % Final   Platelets 01/31/2022 239  150 - 400 K/uL Final   nRBC 01/31/2022 0.0  0.0 - 0.2 % Final   Neutrophils Relative % 01/31/2022 45  % Final   Neutro Abs 01/31/2022 2.4  1.7 - 7.7 K/uL Final   Lymphocytes Relative 01/31/2022 37  % Final   Lymphs Abs 01/31/2022 2.0  0.7 - 4.0 K/uL Final   Monocytes Relative 01/31/2022 14  % Final   Monocytes Absolute 01/31/2022 0.7  0.1 - 1.0 K/uL Final   Eosinophils Relative 01/31/2022 2  % Final   Eosinophils Absolute 01/31/2022 0.1  0.0 - 0.5 K/uL Final   Basophils Relative 01/31/2022 2  % Final   Basophils Absolute 01/31/2022 0.1  0.0 - 0.1 K/uL Final   Immature Granulocytes 01/31/2022 0  % Final   Abs  Immature Granulocytes 01/31/2022 0.01  0.00 - 0.07 K/uL Final   Performed at [facility], [place] [address] [place], [place] [address]   Admission on 01/26/2022, Discharged on 01/27/2022  Component Date Value Ref Range Status   WBC 01/26/2022 5.8  4.0 - 10.5 K/uL Final   RBC 01/26/2022 4.05 (L)  4.22 - 5.81 MIL/uL Final   Hemoglobin 01/26/2022 10.9 (L)  13.0 - 17.0 g/dL Final   HCT 01/26/2022 34.5 (L)  39.0 - 52.0 % Final   MCV 01/26/2022 85.2  80.0 - 100.0 fL Final   MCH 01/26/2022 26.9  26.0 - 34.0 pg Final   MCHC 01/26/2022 31.6  30.0 - 36.0 g/dL Final   RDW 01/26/2022 16.0 (H)  11.5 - 15.5 % Final   Platelets 01/26/2022 192  150 - 400 K/uL Final   nRBC 01/26/2022 0.0  0.0 - 0.2 % Final   Neutrophils Relative % 01/26/2022 44  % Final   Neutro Abs 01/26/2022 2.5  1.7 - 7.7 K/uL Final   Lymphocytes Relative 01/26/2022 38  % Final   Lymphs Abs 01/26/2022 2.2  0.7 - 4.0 K/uL Final   Monocytes Relative 01/26/2022 15  % Final   Monocytes Absolute 01/26/2022 0.9  0.1 - 1.0 K/uL Final   Eosinophils Relative 01/26/2022 2  % Final   Eosinophils Absolute 01/26/2022 0.1  0.0 - 0.5 K/uL Final   Basophils Relative 01/26/2022 1  % Final   Basophils Absolute 01/26/2022 0.1  0.0 - 0.1 K/uL Final   Immature Granulocytes 01/26/2022 0  % Final   Abs Immature Granulocytes 01/26/2022 0.02  0.00 - 0.07 K/uL Final   Performed at [facility], [place] [address], [place], [place] [address]   Sodium 01/26/2022 136  135 - 145 mmol/L Final   Potassium 01/26/2022 3.7  3.5 - 5.1 mmol/L Final   Chloride 01/26/2022 105  98 - 111 mmol/L Final   CO2 01/26/2022 21 (L)  22 - 32 mmol/L Final   Glucose, Bld 01/26/2022 109 (H)  70 - 99 mg/dL Final   Glucose reference range applies only to samples taken after fasting for at least 8 hours.   BUN 01/26/2022 9  6 - 20 mg/dL Final   Creatinine, Ser 01/26/2022 0.59 (L)  0.61 - 1.24 mg/dL Final   Calcium 01/26/2022 9.5  8.9 - 10.3 mg/dL Final   Total Protein 01/26/2022 8.3 (H)  6.5 - 8.1 g/dL Final   Albumin 01/26/2022 4.2  3.5 - 5.0 g/dL Final   AST 01/26/2022 153 (H)  15 - 41 U/L Final    ALT 01/26/2022 90 (H)  0 - 44 U/L Final   Alkaline Phosphatase 01/26/2022 133 (H)  38 - 126 U/L Final   Total Bilirubin 01/26/2022 0.5  0.3 - 1.2 mg/dL Final   GFR, Estimated 01/26/2022 >60  >60 mL/min Final   Comment: (NOTE) Calculated using the CKD-EPI Creatinine Equation (2021)    Anion gap 01/26/2022 10  5 - 15 Final   Performed at [facility], [place] [address], [place], [place] [address]   Magnesium 01/26/2022 2.5 (H)  1.7 - 2.4 mg/dL Final   Performed at [facility] [address] [place], [place] [address]   Alcohol, Ethyl (B) 01/26/2022 336 (HH)  <10 mg/dL Final   Comment: CRITICAL RESULT CALLED TO, READ BACK BY AND VERIFIED WITH ROHR,A AT 2128 ON 01/26/22 BY VAZQUEZJ (NOTE) Lowest detectable limit for serum alcohol is 10 mg/dL.  For  medical purposes only. Performed at [facility], [place] [address], [place], [place] [address]    Opiates 01/27/2022 NONE DETECTED  NONE DETECTED Final   Cocaine 01/27/2022 NONE DETECTED  NONE DETECTED Final   Benzodiazepines 01/27/2022 POSITIVE (A)  NONE DETECTED Final   Amphetamines 01/27/2022 NONE DETECTED  NONE DETECTED Final   Tetrahydrocannabinol 01/27/2022 NONE DETECTED  NONE DETECTED Final   Barbiturates 01/27/2022 NONE DETECTED  NONE DETECTED Final   Comment: (NOTE) DRUG SCREEN FOR MEDICAL PURPOSES ONLY.  IF CONFIRMATION IS NEEDED FOR ANY PURPOSE, NOTIFY LAB WITHIN 5 DAYS.  LOWEST DETECTABLE LIMITS FOR URINE DRUG SCREEN Drug Class                     Cutoff (ng/mL) Amphetamine and metabolites    1000 Barbiturate and metabolites    200 Benzodiazepine                 200 Opiates and metabolites        300 Cocaine and metabolites        300 THC                            50 Performed at [facility], [place] [address] [place], [place] [address]    Color, Urine 01/27/2022 YELLOW  YELLOW Final   APPearance 01/27/2022 CLEAR  CLEAR Final   Specific Gravity,  Urine 01/27/2022 1.024  1.005 - 1.030 Final   pH 01/27/2022 5.0  5.0 - 8.0 Final   Glucose, UA 01/27/2022 NEGATIVE  NEGATIVE mg/dL Final   Hgb urine dipstick 01/27/2022 NEGATIVE  NEGATIVE Final   Bilirubin Urine 01/27/2022 NEGATIVE  NEGATIVE Final   Ketones, ur 01/27/2022 NEGATIVE  NEGATIVE mg/dL Final   Protein, ur 01/27/2022 NEGATIVE  NEGATIVE mg/dL Final   Nitrite 01/27/2022 NEGATIVE  NEGATIVE Final   Leukocytes,Ua 01/27/2022 TRACE (A)  NEGATIVE Final   RBC / HPF 01/27/2022 0-5  0 - 5 RBC/hpf Final   WBC, UA 01/27/2022 6-10  0 - 5 WBC/hpf Final   Bacteria, UA 01/27/2022 NONE SEEN  NONE SEEN Final   Squamous Epithelial / LPF 01/27/2022 0-5  0 - 5 Final   Mucus 01/27/2022 PRESENT   Final   Performed at [facility], [place] [address] [place], [place] [address]   SARS Coronavirus 2 by RT PCR 01/27/2022 NEGATIVE  NEGATIVE Final   Comment: (NOTE) SARS-CoV-2 target nucleic acids are NOT DETECTED.  The SARS-CoV-2 RNA is generally detectable in upper respiratory specimens during the acute phase of infection. The lowest concentration of SARS-CoV-2 viral copies this assay can detect is 138 copies/mL. A negative result does not preclude SARS-Cov-2 infection and should not be used as the sole basis for treatment or other patient management decisions. A negative result may occur with  improper specimen collection/handling, submission of specimen other than nasopharyngeal swab, presence of viral mutation(s) within the areas targeted by this assay, and inadequate number of viral copies(<138 copies/mL). A negative result must be combined with clinical observations, patient history, and epidemiological information. The expected result is Negative.  Fact Sheet for Patients:  EntrepreneurPulse.com.au  Fact Sheet for Healthcare Providers:  IncredibleEmployment.be  This test is no                          t yet approved or cleared by the [place] FDA and  has been  authorized for detection and/or diagnosis of SARS-CoV-2 by FDA under an Emergency Use Authorization (EUA). This EUA will remain  in effect (meaning this test can be used) for the duration of the COVID-19 declaration under Section 564(b)(1) of the Act, 21 U.S.C.section 360bbb-3(b)(1), unless the authorization is terminated  or revoked sooner.       Influenza A by PCR 01/27/2022 NEGATIVE  NEGATIVE Final   Influenza B by PCR 01/27/2022 NEGATIVE  NEGATIVE Final   Comment: (NOTE) The Xpert Xpress SARS-CoV-2/FLU/RSV plus assay is intended as an aid in the diagnosis of influenza from Nasopharyngeal swab specimens and should not be used as a sole basis for treatment. Nasal washings and aspirates are unacceptable for Xpert Xpress SARS-CoV-2/FLU/RSV testing.  Fact Sheet for Patients: EntrepreneurPulse.com.au  Fact Sheet for Healthcare Providers: IncredibleEmployment.be  This test is not yet approved or cleared by the [place] FDA and has been authorized for detection and/or diagnosis of SARS-CoV-2 by FDA under an Emergency Use Authorization (EUA). This EUA will remain in effect (meaning this test can be used) for the duration of the COVID-19 declaration under Section 564(b)(1) of the Act, 21 U.S.C. section 360bbb-3(b)(1), unless the authorization is terminated or revoked.  Performed at [facility], [place] [address], [place], [place] [address]   Admission on 01/24/2022, Discharged on 01/26/2022  Component Date Value Ref Range Status   Sodium 01/24/2022 139  135 - 145 mmol/L Final   Potassium 01/24/2022 3.8  3.5 - 5.1 mmol/L Final   Chloride 01/24/2022 107  98 - 111 mmol/L Final   CO2 01/24/2022 24  22 - 32 mmol/L Final   Glucose, Bld 01/24/2022 95  70 - 99 mg/dL Final   Glucose reference range applies only to samples taken after fasting for at least 8 hours.   BUN 01/24/2022 10  6 - 20 mg/dL Final    Creatinine, Ser 01/24/2022 0.79  0.61 - 1.24 mg/dL Final   Calcium 01/24/2022 8.4 (L)  8.9 - 10.3 mg/dL Final   Total Protein 01/24/2022 7.5  6.5 - 8.1 g/dL Final   Albumin 01/24/2022 3.8  3.5 - 5.0 g/dL Final   AST 01/24/2022 127 (H)  15 - 41 U/L Final   ALT 01/24/2022 78 (H)  0 - 44 U/L Final   Alkaline Phosphatase 01/24/2022 142 (H)  38 - 126 U/L Final   Total Bilirubin 01/24/2022 0.4  0.3 - 1.2 mg/dL Final   GFR, Estimated 01/24/2022 >60  >60 mL/min Final   Comment: (NOTE) Calculated using the CKD-EPI Creatinine Equation (2021)    Anion gap 01/24/2022 8  5 - 15 Final   Performed at [facility], [place] [address], [place], [place] [address]   Alcohol, Ethyl (B) 01/24/2022 268 (H)  <10 mg/dL Final   Comment: (NOTE) Lowest detectable limit for serum alcohol is 10 mg/dL.  For medical purposes only. Performed at [facility], [place] [address], [place], [place] [address]    Opiates 01/25/2022 NONE DETECTED  NONE DETECTED Final   Cocaine 01/25/2022 NONE DETECTED  NONE DETECTED Final   Benzodiazepines 01/25/2022 NONE DETECTED  NONE DETECTED Final   Amphetamines 01/25/2022 NONE DETECTED  NONE DETECTED Final   Tetrahydrocannabinol 01/25/2022 NONE DETECTED  NONE DETECTED Final   Barbiturates 01/25/2022 NONE DETECTED  NONE DETECTED Final   Comment: (NOTE) DRUG SCREEN FOR MEDICAL PURPOSES ONLY.  IF CONFIRMATION IS NEEDED FOR ANY PURPOSE, NOTIFY LAB WITHIN 5 DAYS.  LOWEST DETECTABLE LIMITS FOR URINE DRUG SCREEN Drug  Class                     Cutoff (ng/mL) Amphetamine and metabolites    1000 Barbiturate and metabolites    200 Benzodiazepine                 200 Opiates and metabolites        300 Cocaine and metabolites        300 THC                            50 Performed at [facility], [place] [address] [place], [place] [address]    WBC 01/24/2022 4.8  4.0 - 10.5 K/uL Final   RBC 01/24/2022 3.48 (L)  4.22 - 5.81 MIL/uL Final    Hemoglobin 01/24/2022 9.5 (L)  13.0 - 17.0 g/dL Final   HCT 01/24/2022 30.0 (L)  39.0 - 52.0 % Final   MCV 01/24/2022 86.2  80.0 - 100.0 fL Final   MCH 01/24/2022 27.3  26.0 - 34.0 pg Final   MCHC 01/24/2022 31.7  30.0 - 36.0 g/dL Final   RDW 01/24/2022 16.6 (H)  11.5 - 15.5 % Final   Platelets 01/24/2022 165  150 - 400 K/uL Final   nRBC 01/24/2022 0.0  0.0 - 0.2 % Final   Neutrophils Relative % 01/24/2022 33  % Final   Neutro Abs 01/24/2022 1.6 (L)  1.7 - 7.7 K/uL Final   Lymphocytes Relative 01/24/2022 47  % Final   Lymphs Abs 01/24/2022 2.3  0.7 - 4.0 K/uL Final   Monocytes Relative 01/24/2022 15  % Final   Monocytes Absolute 01/24/2022 0.7  0.1 - 1.0 K/uL Final   Eosinophils Relative 01/24/2022 3  % Final   Eosinophils Absolute 01/24/2022 0.2  0.0 - 0.5 K/uL Final   Basophils Relative 01/24/2022 2  % Final   Basophils Absolute 01/24/2022 0.1  0.0 - 0.1 K/uL Final   Immature Granulocytes 01/24/2022 0  % Final   Abs Immature Granulocytes 01/24/2022 0.00  0.00 - 0.07 K/uL Final   Performed at [facility], [place] [address], [place], [place] [address]   WBC 01/25/2022 4.6  4.0 - 10.5 K/uL Final   RBC 01/25/2022 4.00 (L)  4.22 - 5.81 MIL/uL Final   Hemoglobin 01/25/2022 10.6 (L)  13.0 - 17.0 g/dL Final   HCT 01/25/2022 34.3 (L)  39.0 - 52.0 % Final   MCV 01/25/2022 85.8  80.0 - 100.0 fL Final   MCH 01/25/2022 26.5  26.0 - 34.0 pg Final   MCHC 01/25/2022 30.9  30.0 - 36.0 g/dL Final   RDW 01/25/2022 16.5 (H)  11.5 - 15.5 % Final   Platelets 01/25/2022 199  150 - 400 K/uL Final   nRBC 01/25/2022 0.0  0.0 - 0.2 % Final   Performed at [facility],  [address] [place], [place] [address]  Admission on 01/22/2022, Discharged on 01/23/2022  Component Date Value Ref Range Status   Sodium 01/22/2022 139  135 - 145 mmol/L Final   Potassium 01/22/2022 4.2  3.5 - 5.1 mmol/L Final   Chloride 01/22/2022 102  98 - 111 mmol/L Final   CO2 01/22/2022 21  (L)  22 - 32 mmol/L Final   Glucose, Bld 01/22/2022 105 (H)  70 - 99 mg/dL Final   Glucose reference range applies only to samples taken after fasting for at least 8 hours.  BUN 01/22/2022 8  6 - 20 mg/dL Final   Creatinine, Ser 01/22/2022 0.72  0.61 - 1.24 mg/dL Final   Calcium 01/22/2022 9.7  8.9 - 10.3 mg/dL Final   Total Protein 01/22/2022 8.0  6.5 - 8.1 g/dL Final   Albumin 01/22/2022 4.1  3.5 - 5.0 g/dL Final   AST 01/22/2022 216 (H)  15 - 41 U/L Final   ALT 01/22/2022 115 (H)  0 - 44 U/L Final   Alkaline Phosphatase 01/22/2022 186 (H)  38 - 126 U/L Final   Total Bilirubin 01/22/2022 0.5  0.3 - 1.2 mg/dL Final   GFR, Estimated 01/22/2022 >60  >60 mL/min Final   Comment: (NOTE) Calculated using the CKD-EPI Creatinine Equation (2021)    Anion gap 01/22/2022 16 (H)  5 - 15 Final   Performed at [facility] [address], [place], [place] [address]   WBC 01/22/2022 5.9  4.0 - 10.5 K/uL Final   RBC 01/22/2022 4.09 (L)  4.22 - 5.81 MIL/uL Final   Hemoglobin 01/22/2022 10.9 (L)  13.0 - 17.0 g/dL Final   HCT 01/22/2022 35.1 (L)  39.0 - 52.0 % Final   MCV 01/22/2022 85.8  80.0 - 100.0 fL Final   MCH 01/22/2022 26.7  26.0 - 34.0 pg Final   MCHC 01/22/2022 31.1  30.0 - 36.0 g/dL Final   RDW 01/22/2022 16.5 (H)  11.5 - 15.5 % Final   Platelets 01/22/2022 192  150 - 400 K/uL Final   nRBC 01/22/2022 0.0  0.0 - 0.2 % Final   Neutrophils Relative % 01/22/2022 43  % Final   Neutro Abs 01/22/2022 2.5  1.7 - 7.7 K/uL Final   Lymphocytes Relative 01/22/2022 38  % Final   Lymphs Abs 01/22/2022 2.2  0.7 - 4.0 K/uL Final   Monocytes Relative 01/22/2022 16  % Final   Monocytes Absolute 01/22/2022 0.9  0.1 - 1.0 K/uL Final   Eosinophils Relative 01/22/2022 2  % Final   Eosinophils Absolute 01/22/2022 0.1  0.0 - 0.5 K/uL Final   Basophils Relative 01/22/2022 1  % Final   Basophils Absolute 01/22/2022 0.1  0.0 - 0.1 K/uL Final   Immature Granulocytes 01/22/2022 0  % Final   Abs Immature  Granulocytes 01/22/2022 0.02  0.00 - 0.07 K/uL Final   Performed at [facility], [place] [address] [place], [place] [address]   Alcohol, Ethyl (B) 01/22/2022 340 (HH)  <10 mg/dL Final   Comment: CRITICAL RESULT CALLED TO, READ BACK BY AND VERIFIED WITH Assunta Found, RN, [id number] 01/22/22, A. RAMSEY (NOTE) Lowest detectable limit for serum alcohol is 10 mg/dL.  For medical purposes only. Performed at [facility], [place] [address] [place], [place] [address]    Opiates 01/22/2022 NONE DETECTED  NONE DETECTED Final   Cocaine 01/22/2022 NONE DETECTED  NONE DETECTED Final   Benzodiazepines 01/22/2022 NONE DETECTED  NONE DETECTED Final   Amphetamines 01/22/2022 NONE DETECTED  NONE DETECTED Final   Tetrahydrocannabinol 01/22/2022 NONE DETECTED  NONE DETECTED Final   Barbiturates 01/22/2022 NONE DETECTED  NONE DETECTED Final   Comment: (NOTE) DRUG SCREEN FOR MEDICAL PURPOSES ONLY.  IF CONFIRMATION IS NEEDED FOR ANY PURPOSE, NOTIFY LAB WITHIN 5 DAYS.  LOWEST DETECTABLE LIMITS FOR URINE DRUG SCREEN Drug Class                     Cutoff (ng/mL) Amphetamine and metabolites    1000 Barbiturate and metabolites  200 Benzodiazepine                 200 Opiates and metabolites        300 Cocaine and metabolites        300 THC                            50 Performed at [facility], [place] [address] [place], [place] [address]    Color, Urine 01/22/2022 COLORLESS (A)  YELLOW Final   APPearance 01/22/2022 CLEAR  CLEAR Final   Specific Gravity, Urine 01/22/2022 1.002 (L)  1.005 - 1.030 Final   pH 01/22/2022 5.0  5.0 - 8.0 Final   Glucose, UA 01/22/2022 NEGATIVE  NEGATIVE mg/dL Final   Hgb urine dipstick 01/22/2022 NEGATIVE  NEGATIVE Final   Bilirubin Urine 01/22/2022 NEGATIVE  NEGATIVE Final   Ketones, ur 01/22/2022 NEGATIVE  NEGATIVE mg/dL Final   Protein, ur 01/22/2022 NEGATIVE  NEGATIVE mg/dL Final   Nitrite 01/22/2022 NEGATIVE  NEGATIVE Final   Leukocytes,Ua 01/22/2022 NEGATIVE   NEGATIVE Final   Performed at [facility] [address], [place], [place] [address]  Admission on 01/19/2022, Discharged on 01/21/2022  Component Date Value Ref Range Status   SARS Coronavirus 2 by RT PCR 01/19/2022 NEGATIVE  NEGATIVE Final   Comment: (NOTE) SARS-CoV-2 target nucleic acids are NOT DETECTED.  The SARS-CoV-2 RNA is generally detectable in upper respiratory specimens during the acute phase of infection. The lowest concentration of SARS-CoV-2 viral copies this assay can detect is 138 copies/mL. A negative result does not preclude SARS-Cov-2 infection and should not be used as the sole basis for treatment or other patient management decisions. A negative result may occur with  improper specimen collection/handling, submission of specimen other than nasopharyngeal swab, presence of viral mutation(s) within the areas targeted by this assay, and inadequate number of viral copies(<138 copies/mL). A negative result must be combined with clinical observations, patient history, and epidemiological information. The expected result is Negative.  Fact Sheet for Patients:  EntrepreneurPulse.com.au  Fact Sheet for Healthcare Providers:  IncredibleEmployment.be  This test is no                          t yet approved or cleared by the [place] FDA and  has been authorized for detection and/or diagnosis of SARS-CoV-2 by FDA under an Emergency Use Authorization (EUA). This EUA will remain  in effect (meaning this test can be used) for the duration of the COVID-19 declaration under Section 564(b)(1) of the Act, 21 U.S.C.section 360bbb-3(b)(1), unless the authorization is terminated  or revoked sooner.       Influenza A by PCR 01/19/2022 NEGATIVE  NEGATIVE Final   Influenza B by PCR 01/19/2022 NEGATIVE  NEGATIVE Final   Comment: (NOTE) The Xpert Xpress SARS-CoV-2/FLU/RSV plus assay is intended as an aid in the diagnosis of  influenza from Nasopharyngeal swab specimens and should not be used as a sole basis for treatment. Nasal washings and aspirates are unacceptable for Xpert Xpress SARS-CoV-2/FLU/RSV testing.  Fact Sheet for Patients: EntrepreneurPulse.com.au  Fact Sheet for Healthcare Providers: IncredibleEmployment.be  This test is not yet approved or cleared by the [place] FDA and has been authorized for detection and/or diagnosis of SARS-CoV-2 by FDA under an Emergency Use Authorization (EUA). This EUA will remain in effect (meaning this test can be used) for the duration of the  COVID-19 declaration under Section 564(b)(1) of the Act, 21 U.S.C. section 360bbb-3(b)(1), unless the authorization is terminated or revoked.  Performed at [facility], [place] [address] [place], [place] [address]    WBC 01/19/2022 4.8  4.0 - 10.5 K/uL Final   RBC 01/19/2022 3.95 (L)  4.22 - 5.81 MIL/uL Final   Hemoglobin 01/19/2022 10.8 (L)  13.0 - 17.0 g/dL Final   HCT 01/19/2022 33.2 (L)  39.0 - 52.0 % Final   MCV 01/19/2022 84.1  80.0 - 100.0 fL Final   MCH 01/19/2022 27.3  26.0 - 34.0 pg Final   MCHC 01/19/2022 32.5  30.0 - 36.0 g/dL Final   RDW 01/19/2022 16.7 (H)  11.5 - 15.5 % Final   Platelets 01/19/2022 161  150 - 400 K/uL Final   nRBC 01/19/2022 0.0  0.0 - 0.2 % Final   Neutrophils Relative % 01/19/2022 40  % Final   Neutro Abs 01/19/2022 2.0  1.7 - 7.7 K/uL Final   Lymphocytes Relative 01/19/2022 38  % Final   Lymphs Abs 01/19/2022 1.8  0.7 - 4.0 K/uL Final   Monocytes Relative 01/19/2022 17  % Final   Monocytes Absolute 01/19/2022 0.8  0.1 - 1.0 K/uL Final   Eosinophils Relative 01/19/2022 3  % Final   Eosinophils Absolute 01/19/2022 0.1  0.0 - 0.5 K/uL Final   Basophils Relative 01/19/2022 2  % Final   Basophils Absolute 01/19/2022 0.1  0.0 - 0.1 K/uL Final   Immature Granulocytes 01/19/2022 0  % Final   Abs Immature Granulocytes 01/19/2022 0.02  0.00 -  0.07 K/uL Final   Performed at [facility], [place] [address] [place], [place] [address]   Sodium 01/19/2022 138  135 - 145 mmol/L Final   Potassium 01/19/2022 3.8  3.5 - 5.1 mmol/L Final   Chloride 01/19/2022 102  98 - 111 mmol/L Final   CO2 01/19/2022 22  22 - 32 mmol/L Final   Glucose, Bld 01/19/2022 89  70 - 99 mg/dL Final   Glucose reference range applies only to samples taken after fasting for at least 8 hours.   BUN 01/19/2022 7  6 - 20 mg/dL Final   Creatinine, Ser 01/19/2022 0.64  0.61 - 1.24 mg/dL Final   Calcium 01/19/2022 9.2  8.9 - 10.3 mg/dL Final   Total Protein 01/19/2022 8.1  6.5 - 8.1 g/dL Final   Albumin 01/19/2022 4.0  3.5 - 5.0 g/dL Final   AST 01/19/2022 140 (H)  15 - 41 U/L Final   ALT 01/19/2022 68 (H)  0 - 44 U/L Final   Alkaline Phosphatase 01/19/2022 144 (H)  38 - 126 U/L Final   Total Bilirubin 01/19/2022 0.4  0.3 - 1.2 mg/dL Final   GFR, Estimated 01/19/2022 >60  >60 mL/min Final   Comment: (NOTE) Calculated using the CKD-EPI Creatinine Equation (2021)    Anion gap 01/19/2022 14  5 - 15 Final   Performed at [facility] [address], [place], [place] [address]   Hgb A1c MFr Bld 01/19/2022 5.3  4.8 - 5.6 % Final   Comment: (NOTE) Pre diabetes:          5.7%-6.4%  Diabetes:              >6.4%  Glycemic control for   <7.0% adults with diabetes    Mean Plasma Glucose 01/19/2022 105.41  mg/dL Final   Performed at [facility], [place] [address] [place], [place]  [address]   Alcohol, Ethyl (B) 01/19/2022 290 (H)  <10 mg/dL Final   Comment: (NOTE) Lowest detectable limit for serum alcohol is 10 mg/dL.  For medical purposes only. Performed at [facility], [place] [address] [place], [place] [address]    Cholesterol 01/19/2022 251 (H)  0 - 200 mg/dL Final   Triglycerides 01/19/2022 163 (H)  <150 mg/dL Final   HDL 01/19/2022 105  >40 mg/dL Final   Total CHOL/HDL Ratio 01/19/2022 2.4  RATIO Final   VLDL 01/19/2022 33  0 - 40 mg/dL  Final   LDL Cholesterol 01/19/2022 113 (H)  0 - 99 mg/dL Final   Comment:        Total Cholesterol/HDL:CHD Risk Coronary Heart Disease Risk Table                     Men   Women  1/2 Average Risk   3.4   3.3  Average Risk       5.0   4.4  2 X Average Risk   9.6   7.1  3 X Average Risk  23.4   11.0        Use the calculated Patient Ratio above and the CHD Risk Table to determine the patient's CHD Risk.        ATP III CLASSIFICATION (LDL):  <100     mg/dL   Optimal  100-129  mg/dL   Near or Above                    Optimal  130-159  mg/dL   Borderline  160-189  mg/dL   High  >190     mg/dL   Very High Performed at [facility] [address], [place], [place] [address]    TSH 01/19/2022 0.415  0.350 - 4.500 uIU/mL Final   Comment: Performed by a 3rd Generation assay with a functional sensitivity of <=0.01 uIU/mL. Performed at [facility], [place] [address], [place], [place] [address]    POC Amphetamine UR 01/19/2022 None Detected  NONE DETECTED (Cut Off Level 1000 ng/mL) Final   POC Secobarbital (BAR) 01/19/2022 None Detected  NONE DETECTED (Cut Off Level 300 ng/mL) Final   POC Buprenorphine (BUP) 01/19/2022 None Detected  NONE DETECTED (Cut Off Level 10 ng/mL) Final   POC Oxazepam (BZO) 01/19/2022 None Detected  NONE DETECTED (Cut Off Level 300 ng/mL) Final   POC Cocaine UR 01/19/2022 None Detected  NONE DETECTED (Cut Off Level 300 ng/mL) Final   POC Methamphetamine UR 01/19/2022 None Detected  NONE DETECTED (Cut Off Level 1000 ng/mL) Final   POC Morphine 01/19/2022 None Detected  NONE DETECTED (Cut Off Level 300 ng/mL) Final   POC Methadone UR 01/19/2022 None Detected  NONE DETECTED (Cut Off Level 300 ng/mL) Final   POC Oxycodone UR 01/19/2022 None Detected  NONE DETECTED (Cut Off Level 100 ng/mL) Final   POC Marijuana UR 01/19/2022 None Detected  NONE DETECTED (Cut Off Level 50 ng/mL) Final   SARSCOV2ONAVIRUS 2 AG 01/19/2022 NEGATIVE  NEGATIVE Final   Comment:  (NOTE) SARS-CoV-2 antigen NOT DETECTED.   Negative results are presumptive.  Negative results do not preclude SARS-CoV-2 infection and should not be used as the sole basis for treatment or other patient management decisions, including infection  control decisions, particularly in the presence of clinical signs and  symptoms consistent with COVID-19, or in those who have been in contact with the virus.  Negative  results must be combined with clinical observations, patient history, and epidemiological information. The expected result is Negative.  Fact Sheet for Patients: HandmadeRecipes.com.cy  Fact Sheet for Healthcare Providers: FuneralLife.at  This test is not yet approved or cleared by the [place] FDA and  has been authorized for detection and/or diagnosis of SARS-CoV-2 by FDA under an Emergency Use Authorization (EUA).  This EUA will remain in effect (meaning this test can be used) for the duration of  the COV                          ID-19 declaration under Section 564(b)(1) of the Act, 21 U.S.C. section 360bbb-3(b)(1), unless the authorization is terminated or revoked sooner.    Admission on 01/11/2022, Discharged on 01/12/2022  Component Date Value Ref Range Status   WBC 01/11/2022 6.0  4.0 - 10.5 K/uL Final   RBC 01/11/2022 3.96 (L)  4.22 - 5.81 MIL/uL Final   Hemoglobin 01/11/2022 11.1 (L)  13.0 - 17.0 g/dL Final   HCT 01/11/2022 33.1 (L)  39.0 - 52.0 % Final   MCV 01/11/2022 83.6  80.0 - 100.0 fL Final   MCH 01/11/2022 28.0  26.0 - 34.0 pg Final   MCHC 01/11/2022 33.5  30.0 - 36.0 g/dL Final   RDW 01/11/2022 17.0 (H)  11.5 - 15.5 % Final   Platelets 01/11/2022 115 (L)  150 - 400 K/uL Final   nRBC 01/11/2022 0.0  0.0 - 0.2 % Final   Neutrophils Relative % 01/11/2022 54  % Final   Neutro Abs 01/11/2022 3.3  1.7 - 7.7 K/uL Final   Lymphocytes Relative 01/11/2022 29  % Final   Lymphs Abs 01/11/2022 1.7  0.7 - 4.0 K/uL  Final   Monocytes Relative 01/11/2022 14  % Final   Monocytes Absolute 01/11/2022 0.8  0.1 - 1.0 K/uL Final   Eosinophils Relative 01/11/2022 1  % Final   Eosinophils Absolute 01/11/2022 0.1  0.0 - 0.5 K/uL Final   Basophils Relative 01/11/2022 2  % Final   Basophils Absolute 01/11/2022 0.1  0.0 - 0.1 K/uL Final   Immature Granulocytes 01/11/2022 0  % Final   Abs Immature Granulocytes 01/11/2022 0.02  0.00 - 0.07 K/uL Final   Performed at [facility], [place] [address] [place], [place] [address]   Sodium 01/11/2022 136  135 - 145 mmol/L Final   Potassium 01/11/2022 3.7  3.5 - 5.1 mmol/L Final   Chloride 01/11/2022 101  98 - 111 mmol/L Final   CO2 01/11/2022 21 (L)  22 - 32 mmol/L Final   Glucose, Bld 01/11/2022 106 (H)  70 - 99 mg/dL Final   Glucose reference range applies only to samples taken after fasting for at least 8 hours.   BUN 01/11/2022 5 (L)  6 - 20 mg/dL Final   Creatinine, Ser 01/11/2022 0.66  0.61 - 1.24 mg/dL Final   Calcium 01/11/2022 8.5 (L)  8.9 - 10.3 mg/dL Final   Total Protein 01/11/2022 8.0  6.5 - 8.1 g/dL Final   Albumin 01/11/2022 4.1  3.5 - 5.0 g/dL Final   AST 01/11/2022 149 (H)  15 - 41 U/L Final   ALT 01/11/2022 63 (H)  0 - 44 U/L Final   Alkaline Phosphatase 01/11/2022 131 (H)  38 - 126 U/L Final   Total Bilirubin 01/11/2022 0.7  0.3 - 1.2 mg/dL Final   GFR, Estimated 01/11/2022 >60  >60 mL/min Final   Comment: (NOTE)  Calculated using the CKD-EPI Creatinine Equation (2021)    Anion gap 01/11/2022 14  5 - 15 Final   Performed at [facility], [place] [address], [place], [place] [address]   Alcohol, Ethyl (B) 01/11/2022 351 (HH)  <10 mg/dL Final   Comment: CRITICAL RESULT CALLED TO, READ BACK BY AND VERIFIED WITH B. SANGALANG RN, 0014, 01/12/22, [id number] (NOTE) Lowest detectable limit for serum alcohol is 10 mg/dL.  For medical purposes only. Performed at [facility], [place] [address], [place], [place] [address]   Admission on  12/08/2021, Discharged on 12/09/2021  Component Date Value Ref Range Status   Sodium 12/08/2021 140  135 - 145 mmol/L Final   Potassium 12/08/2021 3.8  3.5 - 5.1 mmol/L Final   Chloride 12/08/2021 103  98 - 111 mmol/L Final   CO2 12/08/2021 24  22 - 32 mmol/L Final   Glucose, Bld 12/08/2021 93  70 - 99 mg/dL Final   Glucose reference range applies only to samples taken after fasting for at least 8 hours.   BUN 12/08/2021 5 (L)  6 - 20 mg/dL Final   Creatinine, Ser 12/08/2021 0.66  0.61 - 1.24 mg/dL Final   Calcium 12/08/2021 8.7 (L)  8.9 - 10.3 mg/dL Final   Total Protein 12/08/2021 8.0  6.5 - 8.1 g/dL Final   Albumin 12/08/2021 4.2  3.5 - 5.0 g/dL Final   AST 12/08/2021 176 (H)  15 - 41 U/L Final   ALT 12/08/2021 75 (H)  0 - 44 U/L Final   Alkaline Phosphatase 12/08/2021 152 (H)  38 - 126 U/L Final   Total Bilirubin 12/08/2021 0.7  0.3 - 1.2 mg/dL Final   GFR, Estimated 12/08/2021 >60  >60 mL/min Final   Comment: (NOTE) Calculated using the CKD-EPI Creatinine Equation (2021)    Anion gap 12/08/2021 13  5 - 15 Final   Performed at [facility], [place] [address], [place], [place] [address]   Alcohol, Ethyl (B) 12/08/2021 372 (HH)  <10 mg/dL Final   Comment: CRITICAL RESULT CALLED TO, READ BACK BY AND VERIFIED WITH RIVERS,C AT 2216 ON 12/08/21 BY [email or web address] (NOTE) Lowest detectable limit for serum alcohol is 10 mg/dL.  For medical purposes only. Performed at [facility], [place] [address], [place], [place] [address]    Salicylate Lvl 03/47/4259 <7.0 (L)  7.0 - 30.0 mg/dL Final   Performed at [facility] [address] [place], [place] [address]   Acetaminophen (Tylenol), Serum 12/08/2021 <10 (L)  10 - 30 ug/mL Final   Comment: (NOTE) Therapeutic concentrations vary significantly. A range of 10-30 ug/mL  may be an effective concentration for many patients. However, some  are best treated at concentrations outside of this  range. Acetaminophen concentrations >150 ug/mL at 4 hours after ingestion  and >50 ug/mL at 12 hours after ingestion are often associated with  toxic reactions.  Performed at [facility] [place] [address] [place], [place] [address]    WBC 12/08/2021 4.0  4.0 - 10.5 K/uL Final   RBC 12/08/2021 4.26  4.22 - 5.81 MIL/uL Final   Hemoglobin 12/08/2021 11.6 (L)  13.0 - 17.0 g/dL Final   HCT 12/08/2021 36.1 (L)  39.0 - 52.0 % Final   MCV 12/08/2021 84.7  80.0 - 100.0 fL Final   MCH 12/08/2021 27.2  26.0 - 34.0 pg Final   MCHC 12/08/2021 32.1  30.0 - 36.0 g/dL Final   RDW 12/08/2021 17.2 (  H)  11.5 - 15.5 % Final   Platelets 12/08/2021 120 (L)  150 - 400 K/uL Final   nRBC 12/08/2021 0.0  0.0 - 0.2 % Final   Performed at [facility], [place] [address], [place], [place] [address]   Opiates 12/08/2021 NONE DETECTED  NONE DETECTED Final   Cocaine 12/08/2021 NONE DETECTED  NONE DETECTED Final   Benzodiazepines 12/08/2021 NONE DETECTED  NONE DETECTED Final   Amphetamines 12/08/2021 NONE DETECTED  NONE DETECTED Final   Tetrahydrocannabinol 12/08/2021 NONE DETECTED  NONE DETECTED Final   Barbiturates 12/08/2021 NONE DETECTED  NONE DETECTED Final   Comment: (NOTE) DRUG SCREEN FOR MEDICAL PURPOSES ONLY.  IF CONFIRMATION IS NEEDED FOR ANY PURPOSE, NOTIFY LAB WITHIN 5 DAYS.  LOWEST DETECTABLE LIMITS FOR URINE DRUG SCREEN Drug Class                     Cutoff (ng/mL) Amphetamine and metabolites    1000 Barbiturate and metabolites    200 Benzodiazepine                 440 Tricyclics and metabolites     300 Opiates and metabolites        300 Cocaine and metabolites        300 THC                            50 Performed at [facility], [place] [address], [place], [place] [address]   Admission on 12/06/2021, Discharged on 12/07/2021  Component Date Value Ref Range Status   Sodium 12/06/2021 138  135 - 145 mmol/L Final   Potassium 12/06/2021  3.6  3.5 - 5.1 mmol/L Final   Chloride 12/06/2021 103  98 - 111 mmol/L Final   CO2 12/06/2021 26  22 - 32 mmol/L Final   Glucose, Bld 12/06/2021 94  70 - 99 mg/dL Final   Glucose reference range applies only to samples taken after fasting for at least 8 hours.   BUN 12/06/2021 <5 (L)  6 - 20 mg/dL Final   Creatinine, Ser 12/06/2021 0.77  0.61 - 1.24 mg/dL Final   Calcium 12/06/2021 9.0  8.9 - 10.3 mg/dL Final   Total Protein 12/06/2021 8.2 (H)  6.5 - 8.1 g/dL Final   Albumin 12/06/2021 4.1  3.5 - 5.0 g/dL Final   AST 12/06/2021 149 (H)  15 - 41 U/L Final   ALT 12/06/2021 66 (H)  0 - 44 U/L Final   Alkaline Phosphatase 12/06/2021 147 (H)  38 - 126 U/L Final   Total Bilirubin 12/06/2021 0.6  0.3 - 1.2 mg/dL Final   GFR, Estimated 12/06/2021 >60  >60 mL/min Final   Comment: (NOTE) Calculated using the CKD-EPI Creatinine Equation (2021)    Anion gap 12/06/2021 9  5 - 15 Final   Performed at [facility], [place] [address], [place], [place] [address]   Alcohol, Ethyl (B) 12/06/2021 380 (HH)  <10 mg/dL Final   Comment: CRITICAL RESULT CALLED TO, READ BACK BY AND VERIFIED WITH GESELL, K @ 2003 [id number] (NOTE) Lowest detectable limit for serum alcohol is 10 mg/dL.  For medical purposes only. Performed at [facility], [place] [address] [place], [place] [address]    WBC 12/06/2021 4.8  4.0 - 10.5 K/uL Final   RBC 12/06/2021 4.41  4.22 - 5.81 MIL/uL Final   Hemoglobin 12/06/2021 12.1 (L)  13.0 -  17.0 g/dL Final   HCT 12/06/2021 37.9 (L)  39.0 - 52.0 % Final   MCV 12/06/2021 85.9  80.0 - 100.0 fL Final   MCH 12/06/2021 27.4  26.0 - 34.0 pg Final   MCHC 12/06/2021 31.9  30.0 - 36.0 g/dL Final   RDW 12/06/2021 17.2 (H)  11.5 - 15.5 % Final   Platelets 12/06/2021 114 (L)  150 - 400 K/uL Final   nRBC 12/06/2021 0.0  0.0 - 0.2 % Final   Performed at [facility], [place] [address], [place], [place] [address]   Magnesium 12/06/2021 2.2  1.7 -  2.4 mg/dL Final   Performed at [facility] [address] [place], [place] [address]  Admission on 11/18/2021, Discharged on 11/18/2021  Component Date Value Ref Range Status   WBC 11/18/2021 4.5  4.0 - 10.5 K/uL Final   RBC 11/18/2021 4.24  4.22 - 5.81 MIL/uL Final   Hemoglobin 11/18/2021 11.7 (L)  13.0 - 17.0 g/dL Final   HCT 11/18/2021 36.1 (L)  39.0 - 52.0 % Final   MCV 11/18/2021 85.1  80.0 - 100.0 fL Final   MCH 11/18/2021 27.6  26.0 - 34.0 pg Final   MCHC 11/18/2021 32.4  30.0 - 36.0 g/dL Final   RDW 11/18/2021 16.4 (H)  11.5 - 15.5 % Final   Platelets 11/18/2021 107 (L)  150 - 400 K/uL Final   nRBC 11/18/2021 0.0  0.0 - 0.2 % Final   Neutrophils Relative % 11/18/2021 43  % Final   Neutro Abs 11/18/2021 2.0  1.7 - 7.7 K/uL Final   Lymphocytes Relative 11/18/2021 40  % Final   Lymphs Abs 11/18/2021 1.8  0.7 - 4.0 K/uL Final   Monocytes Relative 11/18/2021 12  % Final   Monocytes Absolute 11/18/2021 0.5  0.1 - 1.0 K/uL Final   Eosinophils Relative 11/18/2021 3  % Final   Eosinophils Absolute 11/18/2021 0.1  0.0 - 0.5 K/uL Final   Basophils Relative 11/18/2021 2  % Final   Basophils Absolute 11/18/2021 0.1  0.0 - 0.1 K/uL Final   Immature Granulocytes 11/18/2021 0  % Final   Abs Immature Granulocytes 11/18/2021 0.01  0.00 - 0.07 K/uL Final   Performed at [facility], [place] [address] [place], [place] [address]   Sodium 11/18/2021 140  135 - 145 mmol/L Final   Potassium 11/18/2021 3.5  3.5 - 5.1 mmol/L Final   Chloride 11/18/2021 108  98 - 111 mmol/L Final   CO2 11/18/2021 21 (L)  22 - 32 mmol/L Final   Glucose, Bld 11/18/2021 90  70 - 99 mg/dL Final   Glucose reference range applies only to samples taken after fasting for at least 8 hours.   BUN 11/18/2021 <5 (L)  6 - 20 mg/dL Final   Creatinine, Ser 11/18/2021 0.71  0.61 - 1.24 mg/dL Final   Calcium 11/18/2021 8.4 (L)  8.9 - 10.3 mg/dL Final   Total Protein 11/18/2021 7.4  6.5 - 8.1 g/dL Final    Albumin 11/18/2021 3.7  3.5 - 5.0 g/dL Final   AST 11/18/2021 91 (H)  15 - 41 U/L Final   ALT 11/18/2021 48 (H)  0 - 44 U/L Final   Alkaline Phosphatase 11/18/2021 178 (H)  38 - 126 U/L Final   Total Bilirubin 11/18/2021 0.5  0.3 - 1.2 mg/dL Final   GFR, Estimated 11/18/2021 >60  >60 mL/min Final   Comment: (NOTE) Calculated using the CKD-EPI Creatinine Equation (2021)  Anion gap 11/18/2021 11  5 - 15 Final   Performed at [facility] [address] [place], [place] [address]   Alcohol, Ethyl (B) 11/18/2021 336 (HH)  <10 mg/dL Final   Comment: CRITICAL RESULT CALLED TO, READ BACK BY AND VERIFIED WITH DESMOND JAY,RN AT 1455 11/18/2021 BY ZBEECH. (NOTE) Lowest detectable limit for serum alcohol is 10 mg/dL.  For medical purposes only. Performed at [facility], [place] [address] [place], [place] [address]    Glucose-Capillary 11/18/2021 98  70 - 99 mg/dL Final   Glucose reference range applies only to samples taken after fasting for at least 8 hours.   Comment 1 11/18/2021 Notify RN   Final   Comment 2 11/18/2021 Document in Chart   Final  Admission on 11/16/2021, Discharged on 11/16/2021  Component Date Value Ref Range Status   WBC 11/16/2021 4.1  4.0 - 10.5 K/uL Final   RBC 11/16/2021 4.20 (L)  4.22 - 5.81 MIL/uL Final   Hemoglobin 11/16/2021 11.5 (L)  13.0 - 17.0 g/dL Final   HCT 11/16/2021 35.3 (L)  39.0 - 52.0 % Final   MCV 11/16/2021 84.0  80.0 - 100.0 fL Final   MCH 11/16/2021 27.4  26.0 - 34.0 pg Final   MCHC 11/16/2021 32.6  30.0 - 36.0 g/dL Final   RDW 11/16/2021 16.3 (H)  11.5 - 15.5 % Final   Platelets 11/16/2021 127 (L)  150 - 400 K/uL Final   nRBC 11/16/2021 0.0  0.0 - 0.2 % Final   Neutrophils Relative % 11/16/2021 38  % Final   Neutro Abs 11/16/2021 1.5 (L)  1.7 - 7.7 K/uL Final   Lymphocytes Relative 11/16/2021 45  % Final   Lymphs Abs 11/16/2021 1.9  0.7 - 4.0 K/uL Final   Monocytes Relative 11/16/2021 12  % Final   Monocytes Absolute 11/16/2021  0.5  0.1 - 1.0 K/uL Final   Eosinophils Relative 11/16/2021 3  % Final   Eosinophils Absolute 11/16/2021 0.1  0.0 - 0.5 K/uL Final   Basophils Relative 11/16/2021 2  % Final   Basophils Absolute 11/16/2021 0.1  0.0 - 0.1 K/uL Final   Immature Granulocytes 11/16/2021 0  % Final   Abs Immature Granulocytes 11/16/2021 0.01  0.00 - 0.07 K/uL Final   Performed at [facility], [place] [address] [place], [place] [address]   Sodium 11/16/2021 140  135 - 145 mmol/L Final   Potassium 11/16/2021 3.7  3.5 - 5.1 mmol/L Final   Chloride 11/16/2021 107  98 - 111 mmol/L Final   CO2 11/16/2021 22  22 - 32 mmol/L Final   Glucose, Bld 11/16/2021 93  70 - 99 mg/dL Final   Glucose reference range applies only to samples taken after fasting for at least 8 hours.   BUN 11/16/2021 <5 (L)  6 - 20 mg/dL Final   Creatinine, Ser 11/16/2021 0.77  0.61 - 1.24 mg/dL Final   Calcium 11/16/2021 8.8 (L)  8.9 - 10.3 mg/dL Final   Total Protein 11/16/2021 7.8  6.5 - 8.1 g/dL Final   Albumin 11/16/2021 4.0  3.5 - 5.0 g/dL Final   AST 11/16/2021 103 (H)  15 - 41 U/L Final   ALT 11/16/2021 52 (H)  0 - 44 U/L Final   Alkaline Phosphatase 11/16/2021 197 (H)  38 - 126 U/L Final   Total Bilirubin 11/16/2021 0.4  0.3 - 1.2 mg/dL Final   GFR, Estimated 11/16/2021 >60  >60 mL/min Final  Comment: (NOTE) Calculated using the CKD-EPI Creatinine Equation (2021)    Anion gap 11/16/2021 11  5 - 15 Final   Performed at [facility], [place] [address], [place], [place] [address]  There may be more visits with results that are not included.    Blood Alcohol level:  Lab Results  Component Value Date   ETH 336 (HH) 01/26/2022   ETH 268 (H) 50/35/4656    Metabolic Disorder Labs: Lab Results  Component Value Date   HGBA1C 5.3 01/19/2022   MPG 105.41 01/19/2022   MPG 116.89 07/17/2021   Lab Results  Component Value Date   PROLACTIN 23.2 (H) 03/07/2016   Lab Results  Component Value Date   CHOL 251 (H) 01/19/2022    TRIG 163 (H) 01/19/2022   HDL 105 01/19/2022   CHOLHDL 2.4 01/19/2022   VLDL 33 01/19/2022   LDLCALC 113 (H) 01/19/2022   LDLCALC 66 07/17/2021    Therapeutic Lab Levels: No results found for: "LITHIUM" No results found for: "VALPROATE" No results found for: "CBMZ"  Physical Findings   AIMS    Flowsheet Row Admission (Discharged) from 07/16/2017 in [place] [facility]B Admission (Discharged) from 05/15/2017 in [place] [facility] Admission (Discharged) from 07/06/2016 in [place] [facility]B Admission (Discharged) from 06/15/2016 in [place] [facility]B Admission (Discharged) from 03/05/2016 in [place] 500B  AIMS Total Score 0 0 0 0 0      AUDIT    Flowsheet Row Admission (Discharged) from 07/16/2021 in [place] [facility]B Admission (Discharged) from 07/16/2017 in [place] 300B Admission (Discharged) from 05/15/2017 in [place] [facility] Admission (Discharged) from 07/06/2016 in [place] 300B Admission (Discharged) from 06/15/2016 in [place] 400B  Alcohol Use Disorder Identification Test Final Score (AUDIT) 23 40 30 36 [place] Office Visit from 02/01/2019 in [place]  Total GAD-7 Score 7      PHQ2-9    [place] [facility] from 01/27/2022 in [facility] ED from 01/19/2022 in [facility] ED from 08/07/2021 in [place] DE[facility] ED from 09/04/2020 in [place] ED from 06/17/2020 in [place] DEPT  PHQ-2 Total Score '6 2 4 4 4  '$ PHQ-9 Total Score '23 2 14 18 21      '$ Flowsheet Row ED from 01/27/2022 in [facility] ED from 01/26/2022 in [place] DEPT ED from 01/24/2022 in [place] DEPT  C-SSRS RISK CATEGORY No Risk High Risk High Risk        Musculoskeletal  Strength & Muscle Tone: within normal limits Gait & Station: normal Patient leans: N/A  Psychiatric Specialty Exam  Presentation  General Appearance:  -- (Disheveled, unkempt)  Eye Contact: Fair  Speech: Clear and Coherent; Normal Rate  Speech Volume: Normal  Handedness: Right   Mood and Affect  Mood: Euthymic  Affect: Appropriate; Congruent; Full Range   Thought Process  Thought Processes: Coherent; Goal Directed; Linear  Descriptions of Associations:Intact  Orientation:Full (Time, Place and Person)  Thought Content:WDL  Diagnosis of Schizophrenia or Schizoaffective disorder in past: No    Hallucinations:Hallucinations: None (Denied AVH)  Ideas of Reference:None  Suicidal Thoughts:Suicidal  Thoughts: No (contracted to safety) SI Passive Intent and/or Plan: -- (Denied)  Homicidal Thoughts:Homicidal Thoughts: No   Sensorium  Memory: Immediate Good  Judgment: Fair  Insight: Fair   Executive Functions  Concentration: Good  Attention Span: Good  Recall: Good  Fund of Knowledge: Good  Language: Good   Psychomotor Activity  Psychomotor Activity: Psychomotor Activity: Normal   Assets  Assets: Communication Skills; Desire for Improvement   Sleep  Sleep: Sleep: Good  Physical Exam  Physical Exam Vitals and nursing note reviewed.  Constitutional:      General: He is not in acute distress.    Appearance: Normal appearance. He is normal weight. He is not ill-appearing, toxic-appearing or diaphoretic.  HENT:     Head: Normocephalic and atraumatic.  Pulmonary:     Effort: Pulmonary effort is normal.  Neurological:     General: No focal deficit present.     Mental Status: He is alert.     Review of Systems  Respiratory:  Negative for cough and shortness of breath.   Cardiovascular:  Negative for chest pain.  Gastrointestinal:  Negative for abdominal pain, constipation, diarrhea, nausea and vomiting.  Neurological:  Positive for dizziness (mild). Negative for tremors (last time 11/12) and headaches.   Blood pressure 126/86, pulse 96, temperature 98.9 F (37.2 C), temperature source Oral, resp. rate 18, SpO2 100 %. There is no height or weight on file to calculate BMI.  Treatment Plan Summary: Daily contact with patient to assess and evaluate symptoms and progress in treatment and Medication management  KHYAN OATS is a 56 y.o. male who presented to [facility] on 11/8 with initial complaints of SI/HI but reporting significant EtOH abuse, he was admitted to [facility] on 11/9 for Detox and Residential Placement.  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    Bert is tolerating detox well with minimal withdrawal symptoms that are improving.  Given his cravings he could benefit from Naltrexone, his AST and ALT were elevated on admission so we will check and if normalizing can consider starting Naltrexone 11/12.  We will redraw a CMP and CBC to monitor. We will continue to monitor.  Social Work will assist him with Residential placement, barrier [place] ID.   Withdrawal: -Continue CIWA, scores 0 x24hrs -Continue Ativan 1 mg q6 PRN CIWA>10 -Continue Imodium 2-4 mg PRN diarrhea -Continue Zofran-ODT 4 mg q6 PRN nausea -Continue Thiamine 100 mg daily for nutritional supplementation -Continue Multivitamin daily for nutritional supplementation  AUD Holding on naltrexone due to elevated AST/ALT, not down trending.  Hepatitis, hepatic function, RPR, HIV labs ordered -Continue Gabapentin 300 mg TID   Depression: -Continue Celexa 10 mg daily -Continue Remeron 15 mg QHS      -Continue  PRN's: Tylenol, Maalox, Atarax, Milk of Magnesia       Dispo: [place], DO 02/01/2022 12:11 PM

## 2022-02-01 NOTE — ED Notes (Signed)
Snacks given 

## 2022-02-01 NOTE — ED Notes (Signed)
Pt sleeping. No noted resp distress. Will continue to monitor for safety 

## 2022-02-01 NOTE — ED Notes (Signed)
Pt asleep in bed. Respirations even and unlabored. Monitoring for safety. 

## 2022-02-01 NOTE — ED Notes (Signed)
Patient awake and alert on unit eating breakfast and watching the morning news.  He is calm, cooperative and focused on attending long term rehab.  He makes needs known appropriately and is without somatic distress or complaint.  He denies avh shi or plan.  Will monitor.

## 2022-02-01 NOTE — ED Notes (Signed)
Pt in room, napping. A/Ox4. Denies SI/HI/AVH. Denies s/s of withdrawal. No noted resp distress. Will continue to monitor for safety

## 2022-02-02 DIAGNOSIS — F1010 Alcohol abuse, uncomplicated: Secondary | ICD-10-CM | POA: Diagnosis not present

## 2022-02-02 DIAGNOSIS — F332 Major depressive disorder, recurrent severe without psychotic features: Secondary | ICD-10-CM | POA: Diagnosis not present

## 2022-02-02 DIAGNOSIS — D509 Iron deficiency anemia, unspecified: Secondary | ICD-10-CM | POA: Diagnosis not present

## 2022-02-02 DIAGNOSIS — K709 Alcoholic liver disease, unspecified: Secondary | ICD-10-CM | POA: Diagnosis not present

## 2022-02-02 LAB — RPR: RPR Ser Ql: NONREACTIVE

## 2022-02-02 NOTE — Discharge Instructions (Signed)
Dear Barry Moore,   Please establish with a primary care provider to take care of your medications, as well as help with referrals to specialists. Recommend asking PCP for a referral to a liver doctor and a psychiatrist.   Once your liver numbers are lower, please discuss with your doctor to possibly start naltrexone.  If you feel that your mood is not where you want it to be, please talk to your doctors about increasing your celexa.  It has been a pleasure working with you for treatment at our facility.   Best,  Dr. Alfonse Spruce

## 2022-02-02 NOTE — ED Notes (Signed)
Patient attended group with Social Worker, they watched a video with discussion.

## 2022-02-02 NOTE — ED Notes (Signed)
Pt asleep in bed. Respirations even and unlabored. Monitoring for safety. 

## 2022-02-02 NOTE — [facility] Group Notes (Signed)
[facility] LCSW Group Therapy Note  Date/Time: 02/02/2022 at 3:15pm  Type of Therapy/Topic:  Group Therapy:  Balance in Life  Participation Level:  Active  Description of Group:   This group will address the importance of considering the journey and not just the destination. Patients will be encouraged to process areas in their lives where they found it hard to focus on the good rather than the bad, and identify reasons for maintaining such thought pattern. Facilitator will guide patients utilizing problem- solving interventions to address and improve the way each patient views themselves and their situation. Patients will work through understanding and applying humility when it comes to life changes and the interactions we have with others. Patients will be encouraged to explore ways that they will move forward throughout life's journey, and make healthier decisions for themselves.   Therapeutic Goals: Patient will identify two or more emotions or situations that they observed or felt throughout watching the video clip. Patient will identify signs where they may have responded to someone or situation due to their current circumstance.  Patient will identify two ways to set better habits in order to achieve more peace in their lives. Patient will demonstrate ability to communicate their needs through discussion and/or role plays.  Summary of Patient Progress: Patient actively participated in group on today. Patient was able to identify areas or times in his life where he felt like it was harder to focus on the good rather than the negative. Patient reports that the video clip definitely made him reflect on the way he has showed humility and continues to show humility towards other people. Patient spoke about his journey of being homeless for over 20 years and how the journey has been challenging but yet rewarding in some way. Patient reports being grateful that he has matured over the years and is hopeful  for the next push forward. Patient reports feeling encouraged from watching the video, and his was able to provide feedback to staff and peers.   Therapeutic Modalities:   Cognitive Behavioral Therapy Solution-Focused Therapy Assertiveness Training  Barry Moore, [place] Clinical Social Worker [facility] Ph: [phone]

## 2022-02-02 NOTE — ED Notes (Signed)
Patient is awake and alert on unit.  He is presently sitting in dayroom watching tv with peer.  He is calm, quiet and without distress or complaint.  Patient is without withdrawal symptoms and makes needs known as they arise.  He has been accepted to aftercare rehab and is pending discharge on thurdsay.  Will monitor and provide a safe environment for him.

## 2022-02-02 NOTE — ED Notes (Signed)
Gave pt snacks and a drink.

## 2022-02-02 NOTE — ED Notes (Signed)
Pt is in the bed sleeping. Respirations are even and unlabored. No acute distress noted. Will continue to monitor for safety. 

## 2022-02-02 NOTE — ED Notes (Signed)
Pt is in the bed resting. Respirations are even and unlabored. No acute distress noted. Will continue to monitor for safety.Pt is in the bed sleeping.

## 2022-02-02 NOTE — Progress Notes (Signed)
Patient is awake and alert on unit.  He is calm, pleasant, organized and logical.  Patient is without distress or complaint.  He asked for and was assisted in washing his clothing.  RN also found additional clothing in donation for him to wear as he only had one outfit.  Patient also asked for and was assisted with charging his phone so that it would be ready for him at discharge on Thursday.  Patient was accepted to a program in [place].  Transportation with safe transport has been set up for him and he will be transported there.  Patient is aware and in agreement with this plan.  Will monitor and provide a safe environment for him.

## 2022-02-02 NOTE — Discharge Planning (Signed)
Patient has been accepted to [facility] in [place], [place] per Admissions Coordinator Herbie Baltimore. Earliest admission for their facility is Thursday 11/16. [place] [address]; Number for follow up is [phone]. Per Herbie Baltimore, patient would need to arrive at the facility before 8:00pm. Facility is unable to provide transportation at this time for patient. However, LCSW was provided the Medicaid cab number for transport: [phone]. Ordered scripts or full supply of medications recommended. Herbie Baltimore made aware that facility will likely plan for a 10:00am discharge on 11/16 and Herbie Baltimore was appreciative of LCSW assistance. No other needs to reported.    MD, RN, and patient to be made aware. LCSW will also follow up with Medicaid cab to schedule transport for Thursday 11/16.   Lucius Conn, LCSW Clinical Social Worker [facility] Ph: [phone]

## 2022-02-02 NOTE — ED Provider Notes (Signed)
Behavioral Health Progress Note  Date and Time: 02/02/2022 11:49 AM Name: KADEEM HYLE MRN:  [id number]  Subjective:   MEHKI KLUMPP is a 56 y.o. male who presented to [facility] on 11/8 with initial complaints of SI/HI but reporting significant EtOH abuse, he was admitted to [facility] on 11/9 for Detox and Residential Placement.  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   Patient denied shakes or tremors.  Denied cravings for alcohol or nicotine.  Reported that he feels better since admission, however he still feels "grey" with associating symptoms of difficulties concentrating, low motivation, anhedonia.  Denied SI.  Stated that he is motivated for his grandchildren, and is excited to see them once he is sober again.  Stated that he is able to talk to his sister for assistance with outpatient follow-up, see below.  Reported only one episode of transient dizziness while at rest.  About 1 hour after medications yesterday.  Patient was unbothered by it.  SI: Suicidal Thoughts: No (Contracted to safety) SI Passive Intent and/or Plan:  (Denied) HI: Homicidal Thoughts: No AVH: Hallucinations: None (Denied AVH) Ideas of HEN:IDPO   Mood:  (gray) Sleep:Good Appetite: Good   Diagnosis:  Final diagnoses:  Alcohol abuse  Homelessness  Depressed affect    Total Time spent with patient: 35 minutes  Past Psychiatric History: Depression and EtOH Abuse complicated by pancreatitis, GI bleed, thrombocytopenia, hypertension, homelessness.  Past Medical History:  Past Medical History:  Diagnosis Date   Alcoholic hepatitis    Depression    ETOH abuse    Gastric bleed 08/2018   Hypertension    Pancreatitis    Seizures ([place])    Suicidal behavior     Past Surgical History:  Procedure Laterality Date   BIOPSY  08/05/2018   Procedure: BIOPSY;  Surgeon: Irving Copas., MD;  Location: [facility]  ENDOSCOPY;  Service: Gastroenterology;;   BIOPSY  08/07/2018   Procedure: BIOPSY;  Surgeon: Thornton Park, MD;  Location: [facility] ENDOSCOPY;  Service: Gastroenterology;;   BIOPSY  01/02/2019   Procedure: BIOPSY;  Surgeon: Gatha Mayer, MD;  Location: [facility] ENDOSCOPY;  Service: Endoscopy;;   COLONOSCOPY WITH PROPOFOL N/A 08/07/2018   Procedure: COLONOSCOPY WITH PROPOFOL;  Surgeon: Thornton Park, MD;  Location: [place];  Service: Gastroenterology;  Laterality: N/A;   ESOPHAGOGASTRODUODENOSCOPY N/A 01/02/2019   Procedure: ESOPHAGOGASTRODUODENOSCOPY (EGD);  Surgeon: Gatha Mayer, MD;  Location: [facility] ENDOSCOPY;  Service: Endoscopy;  Laterality: N/A;   ESOPHAGOGASTRODUODENOSCOPY N/A 07/03/2019   Procedure: ESOPHAGOGASTRODUODENOSCOPY (EGD);  Surgeon: Wonda Horner, MD;  Location: [facility] ENDOSCOPY;  Service: Endoscopy;  Laterality: N/A;   ESOPHAGOGASTRODUODENOSCOPY N/A 06/26/2020   Procedure: ESOPHAGOGASTRODUODENOSCOPY (EGD);  Surgeon: Arta Silence, MD;  Location: [facility] ENDOSCOPY;  Service: Endoscopy;  Laterality: N/A;   ESOPHAGOGASTRODUODENOSCOPY (EGD) WITH PROPOFOL N/A 11/02/2016   Procedure: ESOPHAGOGASTRODUODENOSCOPY (EGD) WITH PROPOFOL;  Surgeon: Carol Ada, MD;  Location: [facility] ENDOSCOPY;  Service: Endoscopy;  Laterality: N/A;   ESOPHAGOGASTRODUODENOSCOPY (EGD) WITH PROPOFOL N/A 08/05/2018   Procedure: ESOPHAGOGASTRODUODENOSCOPY (EGD) WITH PROPOFOL;  Surgeon: Rush Landmark Telford Nab., MD;  Location: [place];  Service: Gastroenterology;  Laterality: N/A;   ESOPHAGOGASTRODUODENOSCOPY (EGD) WITH PROPOFOL N/A 07/14/2019   Procedure: ESOPHAGOGASTRODUODENOSCOPY (EGD) WITH PROPOFOL;  Surgeon: Otis Brace, MD;  Location: [facility] ENDOSCOPY;  Service: Gastroenterology;  Laterality: N/A;   HERNIA REPAIR     LEG SURGERY     POLYPECTOMY  08/07/2018   Procedure: POLYPECTOMY;  Surgeon: Thornton Park, MD;  Location:  [facility] ENDOSCOPY;  Service: Gastroenterology;;   Family History:  Family History  Problem  Relation Age of Onset   Diabetes Mother    Alcoholism Mother    Emphysema Father    Lung cancer Father    Alcoholism Father    Family Psychiatric  History: Father- EtOH Abuse Mother- EtOH Abuse Social History:  Social History   Substance and Sexual Activity  Alcohol Use Yes   Comment: 5+ BEERS DAILY, Patient does not know how much he drank tonight     Social History   Substance and Sexual Activity  Drug Use Not Currently   Frequency: 3.0 times per week   Types: Cocaine    Social History   Socioeconomic History   Marital status: Divorced    Spouse name: Not on file   Number of children: Not on file   Years of education: Not on file   Highest education level: Not on file  Occupational History   Not on file  Tobacco Use   Smoking status: Every Day    Packs/day: 1.00    Types: Cigarettes   Smokeless tobacco: Never  Vaping Use   Vaping Use: Never used  Substance and Sexual Activity   Alcohol use: Yes    Comment: 5+ BEERS DAILY, Patient does not know how much he drank tonight   Drug use: Not Currently    Frequency: 3.0 times per week    Types: Cocaine   Sexual activity: Not Currently  Other Topics Concern   Not on file  Social History Narrative   Not on file   Social Determinants of Health   Financial Resource Strain: Not on file  Food Insecurity: Not on file  Transportation Needs: Not on file  Physical Activity: Not on file  Stress: Not on file  Social Connections: Not on file   SDOH:  SDOH Screenings   Alcohol Screen: High Risk (07/16/2021)  Depression (PHQ2-9): High Risk (01/28/2022)  Tobacco Use: High Risk (01/26/2022)   Additional Social History:                         Current Medications:  Current Facility-Administered Medications  Medication Dose Route Frequency Provider Last Rate Last Admin   acetaminophen (TYLENOL) tablet 650 mg  650 mg Oral Q6H PRN Evette Georges, NP       alum & mag hydroxide-simeth (MAALOX/MYLANTA) 200-200-20  MG/5ML suspension 30 mL  30 mL Oral Q4H PRN Evette Georges, NP       citalopram (CELEXA) tablet 10 mg  10 mg Oral Daily Evette Georges, NP   10 mg at 02/02/22 0945   gabapentin (NEURONTIN) capsule 300 mg  300 mg Oral TID Evette Georges, NP   300 mg at 02/02/22 0945   magnesium hydroxide (MILK OF MAGNESIA) suspension 30 mL  30 mL Oral Daily PRN Evette Georges, NP       mirtazapine (REMERON) tablet 15 mg  15 mg Oral QHS Derrill Center, NP   15 mg at 02/01/22 2101   multivitamin with minerals tablet 1 tablet  1 tablet Oral Daily Evette Georges, NP   1 tablet at 02/02/22 0945   thiamine (VITAMIN B1) tablet 100 mg  100 mg Oral Daily Evette Georges, NP   100 mg at 02/02/22 0945   Current Outpatient Medications  Medication Sig Dispense Refill   citalopram (CELEXA) 10 MG tablet Take 1 tablet (10 mg total) by mouth daily. (Patient not taking: Reported on 01/26/2022) 7 tablet  0   gabapentin (NEURONTIN) 300 MG capsule Take 1 capsule (300 mg total) by mouth 3 (three) times daily. (Patient not taking: Reported on 01/26/2022) 90 capsule 0   Multiple Vitamin (MULTIVITAMIN WITH MINERALS) TABS tablet Take 1 tablet by mouth daily. (Patient not taking: Reported on 01/26/2022) 30 tablet 0   thiamine (VITAMIN B-1) 100 MG tablet Take 1 tablet (100 mg total) by mouth daily. 30 tablet 0    Labs  Lab Results:  Admission on 01/27/2022  Component Date Value Ref Range Status   Sodium 01/31/2022 136  135 - 145 mmol/L Final   Potassium 01/31/2022 4.1  3.5 - 5.1 mmol/L Final   Chloride 01/31/2022 98  98 - 111 mmol/L Final   CO2 01/31/2022 27  22 - 32 mmol/L Final   Glucose, Bld 01/31/2022 136 (H)  70 - 99 mg/dL Final   Glucose reference range applies only to samples taken after fasting for at least 8 hours.   BUN 01/31/2022 10  6 - 20 mg/dL Final   Creatinine, Ser 01/31/2022 1.04  0.61 - 1.24 mg/dL Final   Calcium 01/31/2022 9.5  8.9 - 10.3 mg/dL Final   Total Protein 01/31/2022 7.6  6.5 - 8.1 g/dL Final   Albumin 01/31/2022  4.0  3.5 - 5.0 g/dL Final   AST 01/31/2022 148 (H)  15 - 41 U/L Final   ALT 01/31/2022 140 (H)  0 - 44 U/L Final   Alkaline Phosphatase 01/31/2022 132 (H)  38 - 126 U/L Final   Total Bilirubin 01/31/2022 0.3  0.3 - 1.2 mg/dL Final   GFR, Estimated 01/31/2022 >60  >60 mL/min Final   Comment: (NOTE) Calculated using the CKD-EPI Creatinine Equation (2021)    Anion gap 01/31/2022 11  5 - 15 Final   Performed at [facility] [address], [place], [place] [address]   WBC 01/31/2022 5.4  4.0 - 10.5 K/uL Final   RBC 01/31/2022 3.97 (L)  4.22 - 5.81 MIL/uL Final   Hemoglobin 01/31/2022 10.8 (L)  13.0 - 17.0 g/dL Final   HCT 01/31/2022 34.3 (L)  39.0 - 52.0 % Final   MCV 01/31/2022 86.4  80.0 - 100.0 fL Final   MCH 01/31/2022 27.2  26.0 - 34.0 pg Final   MCHC 01/31/2022 31.5  30.0 - 36.0 g/dL Final   RDW 01/31/2022 15.9 (H)  11.5 - 15.5 % Final   Platelets 01/31/2022 239  150 - 400 K/uL Final   nRBC 01/31/2022 0.0  0.0 - 0.2 % Final   Neutrophils Relative % 01/31/2022 45  % Final   Neutro Abs 01/31/2022 2.4  1.7 - 7.7 K/uL Final   Lymphocytes Relative 01/31/2022 37  % Final   Lymphs Abs 01/31/2022 2.0  0.7 - 4.0 K/uL Final   Monocytes Relative 01/31/2022 14  % Final   Monocytes Absolute 01/31/2022 0.7  0.1 - 1.0 K/uL Final   Eosinophils Relative 01/31/2022 2  % Final   Eosinophils Absolute 01/31/2022 0.1  0.0 - 0.5 K/uL Final   Basophils Relative 01/31/2022 2  % Final   Basophils Absolute 01/31/2022 0.1  0.0 - 0.1 K/uL Final   Immature Granulocytes 01/31/2022 0  % Final   Abs Immature Granulocytes 01/31/2022 0.01  0.00 - 0.07 K/uL Final   Performed at [facility], [place] [address], [place], [place] [address]   Total Protein 02/01/2022 7.7  6.5 - 8.1 g/dL Final   Albumin 02/01/2022 3.9  3.5 - 5.0 g/dL Final  AST 02/01/2022 118 (H)  15 - 41 U/L Final   ALT 02/01/2022 128 (H)  0 - 44 U/L Final   Alkaline Phosphatase 02/01/2022 116  38 - 126 U/L Final   Total Bilirubin  02/01/2022 0.2 (L)  0.3 - 1.2 mg/dL Final   Bilirubin, Direct 02/01/2022 0.1  0.0 - 0.2 mg/dL Final   Indirect Bilirubin 02/01/2022 0.1 (L)  0.3 - 0.9 mg/dL Final   Performed at [facility] [address], [place], [place] [address]   RPR Ser Ql 02/01/2022 NON REACTIVE  NON REACTIVE Final   Performed at [facility], [place] [address], [place], [place] [address]   HIV Screen 4th Generation wRfx 02/01/2022 Non Reactive  Non Reactive Final   Performed at [facility], [place] [address], [place], [place] [address]   Hepatitis B Surface Ag 02/01/2022 NON REACTIVE  NON REACTIVE Final   HCV Ab 02/01/2022 NON REACTIVE  NON REACTIVE Final   Comment: (NOTE) Nonreactive HCV antibody screen is consistent with no HCV infections,  unless recent infection is suspected or other evidence exists to indicate HCV infection.     Hep A IgM 02/01/2022 NON REACTIVE  NON REACTIVE Final   Hep B C IgM 02/01/2022 NON REACTIVE  NON REACTIVE Final   Performed at [facility], [place] [address], [place], [place] [address]   Iron 02/01/2022 66  45 - 182 ug/dL Final   TIBC 02/01/2022 613 (H)  250 - 450 ug/dL Final   Saturation Ratios 02/01/2022 11 (L)  17.9 - 39.5 % Final   UIBC 02/01/2022 547  ug/dL Final   Performed at [facility], [place] [address] [place],  [address]  Admission on 01/26/2022, Discharged on 01/27/2022  Component Date Value Ref Range Status   WBC 01/26/2022 5.8  4.0 - 10.5 K/uL Final   RBC 01/26/2022 4.05 (L)  4.22 - 5.81 MIL/uL Final   Hemoglobin 01/26/2022 10.9 (L)  13.0 - 17.0 g/dL Final   HCT 01/26/2022 34.5 (L)  39.0 - 52.0 % Final   MCV 01/26/2022 85.2  80.0 - 100.0 fL Final   MCH 01/26/2022 26.9  26.0 - 34.0 pg Final   MCHC 01/26/2022 31.6  30.0 - 36.0 g/dL Final   RDW 01/26/2022 16.0 (H)  11.5 - 15.5 % Final   Platelets 01/26/2022 192  150 - 400 K/uL Final   nRBC 01/26/2022 0.0  0.0 - 0.2 % Final   Neutrophils Relative % 01/26/2022 44  % Final   Neutro Abs  01/26/2022 2.5  1.7 - 7.7 K/uL Final   Lymphocytes Relative 01/26/2022 38  % Final   Lymphs Abs 01/26/2022 2.2  0.7 - 4.0 K/uL Final   Monocytes Relative 01/26/2022 15  % Final   Monocytes Absolute 01/26/2022 0.9  0.1 - 1.0 K/uL Final   Eosinophils Relative 01/26/2022 2  % Final   Eosinophils Absolute 01/26/2022 0.1  0.0 - 0.5 K/uL Final   Basophils Relative 01/26/2022 1  % Final   Basophils Absolute 01/26/2022 0.1  0.0 - 0.1 K/uL Final   Immature Granulocytes 01/26/2022 0  % Final   Abs Immature Granulocytes 01/26/2022 0.02  0.00 - 0.07 K/uL Final   Performed at [facility], [place] [address] [place], [place] [address]   Sodium 01/26/2022 136  135 - 145 mmol/L Final   Potassium 01/26/2022 3.7  3.5 - 5.1 mmol/L Final   Chloride 01/26/2022 105  98 - 111  mmol/L Final   CO2 01/26/2022 21 (L)  22 - 32 mmol/L Final   Glucose, Bld 01/26/2022 109 (H)  70 - 99 mg/dL Final   Glucose reference range applies only to samples taken after fasting for at least 8 hours.   BUN 01/26/2022 9  6 - 20 mg/dL Final   Creatinine, Ser 01/26/2022 0.59 (L)  0.61 - 1.24 mg/dL Final   Calcium 01/26/2022 9.5  8.9 - 10.3 mg/dL Final   Total Protein 01/26/2022 8.3 (H)  6.5 - 8.1 g/dL Final   Albumin 01/26/2022 4.2  3.5 - 5.0 g/dL Final   AST 01/26/2022 153 (H)  15 - 41 U/L Final   ALT 01/26/2022 90 (H)  0 - 44 U/L Final   Alkaline Phosphatase 01/26/2022 133 (H)  38 - 126 U/L Final   Total Bilirubin 01/26/2022 0.5  0.3 - 1.2 mg/dL Final   GFR, Estimated 01/26/2022 >60  >60 mL/min Final   Comment: (NOTE) Calculated using the CKD-EPI Creatinine Equation (2021)    Anion gap 01/26/2022 10  5 - 15 Final   Performed at [facility], [place] [address] [place], [place] [address]   Magnesium 01/26/2022 2.5 (H)  1.7 - 2.4 mg/dL Final   Performed at [facility] [address], [place], [place] [address]   Alcohol, Ethyl (B) 01/26/2022 336 (HH)  <10 mg/dL Final    Comment: CRITICAL RESULT CALLED TO, READ BACK BY AND VERIFIED WITH ROHR,A AT 2128 ON 01/26/22 BY VAZQUEZJ (NOTE) Lowest detectable limit for serum alcohol is 10 mg/dL.  For medical purposes only. Performed at [facility], [place] [address] [place], [place] [address]    Opiates 01/27/2022 NONE DETECTED  NONE DETECTED Final   Cocaine 01/27/2022 NONE DETECTED  NONE DETECTED Final   Benzodiazepines 01/27/2022 POSITIVE (A)  NONE DETECTED Final   Amphetamines 01/27/2022 NONE DETECTED  NONE DETECTED Final   Tetrahydrocannabinol 01/27/2022 NONE DETECTED  NONE DETECTED Final   Barbiturates 01/27/2022 NONE DETECTED  NONE DETECTED Final   Comment: (NOTE) DRUG SCREEN FOR MEDICAL PURPOSES ONLY.  IF CONFIRMATION IS NEEDED FOR ANY PURPOSE, NOTIFY LAB WITHIN 5 DAYS.  LOWEST DETECTABLE LIMITS FOR URINE DRUG SCREEN Drug Class                     Cutoff (ng/mL) Amphetamine and metabolites    1000 Barbiturate and metabolites    200 Benzodiazepine                 200 Opiates and metabolites        300 Cocaine and metabolites        300 THC                            50 Performed at [facility], [place] [address], [place], [place] [address]    Color, Urine 01/27/2022 YELLOW  YELLOW Final   APPearance 01/27/2022 CLEAR  CLEAR Final   Specific Gravity, Urine 01/27/2022 1.024  1.005 - 1.030 Final   pH 01/27/2022 5.0  5.0 - 8.0 Final   Glucose, UA 01/27/2022 NEGATIVE  NEGATIVE mg/dL Final   Hgb urine dipstick 01/27/2022 NEGATIVE  NEGATIVE Final   Bilirubin Urine 01/27/2022 NEGATIVE  NEGATIVE Final   Ketones, ur 01/27/2022 NEGATIVE  NEGATIVE mg/dL Final   Protein, ur 01/27/2022 NEGATIVE  NEGATIVE mg/dL Final   Nitrite 01/27/2022 NEGATIVE  NEGATIVE Final   Leukocytes,Ua  01/27/2022 TRACE (A)  NEGATIVE Final   RBC / HPF 01/27/2022 0-5  0 - 5 RBC/hpf Final   WBC, UA 01/27/2022 6-10  0 - 5 WBC/hpf Final   Bacteria, UA 01/27/2022 NONE SEEN  NONE SEEN Final   Squamous  Epithelial / LPF 01/27/2022 0-5  0 - 5 Final   Mucus 01/27/2022 PRESENT   Final   Performed at [facility] [address] [place], [place] [address]   SARS Coronavirus 2 by RT PCR 01/27/2022 NEGATIVE  NEGATIVE Final   Comment: (NOTE) SARS-CoV-2 target nucleic acids are NOT DETECTED.  The SARS-CoV-2 RNA is generally detectable in upper respiratory specimens during the acute phase of infection. The lowest concentration of SARS-CoV-2 viral copies this assay can detect is 138 copies/mL. A negative result does not preclude SARS-Cov-2 infection and should not be used as the sole basis for treatment or other patient management decisions. A negative result may occur with  improper specimen collection/handling, submission of specimen other than nasopharyngeal swab, presence of viral mutation(s) within the areas targeted by this assay, and inadequate number of viral copies(<138 copies/mL). A negative result must be combined with clinical observations, patient history, and epidemiological information. The expected result is Negative.  Fact Sheet for Patients:  EntrepreneurPulse.com.au  Fact Sheet for Healthcare Providers:  IncredibleEmployment.be  This test is no                          t yet approved or cleared by the [place] FDA and  has been authorized for detection and/or diagnosis of SARS-CoV-2 by FDA under an Emergency Use Authorization (EUA). This EUA will remain  in effect (meaning this test can be used) for the duration of the COVID-19 declaration under Section 564(b)(1) of the Act, 21 U.S.C.section 360bbb-3(b)(1), unless the authorization is terminated  or revoked sooner.       Influenza A by PCR 01/27/2022 NEGATIVE  NEGATIVE Final   Influenza B by PCR 01/27/2022 NEGATIVE  NEGATIVE Final   Comment: (NOTE) The Xpert Xpress SARS-CoV-2/FLU/RSV plus assay is intended as an aid in the diagnosis of influenza from  Nasopharyngeal swab specimens and should not be used as a sole basis for treatment. Nasal washings and aspirates are unacceptable for Xpert Xpress SARS-CoV-2/FLU/RSV testing.  Fact Sheet for Patients: EntrepreneurPulse.com.au  Fact Sheet for Healthcare Providers: IncredibleEmployment.be  This test is not yet approved or cleared by the [place] FDA and has been authorized for detection and/or diagnosis of SARS-CoV-2 by FDA under an Emergency Use Authorization (EUA). This EUA will remain in effect (meaning this test can be used) for the duration of the COVID-19 declaration under Section 564(b)(1) of the Act, 21 U.S.C. section 360bbb-3(b)(1), unless the authorization is terminated or revoked.  Performed at [facility], [place] [address], [place], [place] [address]   Admission on 01/24/2022, Discharged on 01/26/2022  Component Date Value Ref Range Status   Sodium 01/24/2022 139  135 - 145 mmol/L Final   Potassium 01/24/2022 3.8  3.5 - 5.1 mmol/L Final   Chloride 01/24/2022 107  98 - 111 mmol/L Final   CO2 01/24/2022 24  22 - 32 mmol/L Final   Glucose, Bld 01/24/2022 95  70 - 99 mg/dL Final   Glucose reference range applies only to samples taken after fasting for at least 8 hours.   BUN 01/24/2022 10  6 - 20 mg/dL Final   Creatinine, Ser 01/24/2022 0.79  0.61 -  1.24 mg/dL Final   Calcium 01/24/2022 8.4 (L)  8.9 - 10.3 mg/dL Final   Total Protein 01/24/2022 7.5  6.5 - 8.1 g/dL Final   Albumin 01/24/2022 3.8  3.5 - 5.0 g/dL Final   AST 01/24/2022 127 (H)  15 - 41 U/L Final   ALT 01/24/2022 78 (H)  0 - 44 U/L Final   Alkaline Phosphatase 01/24/2022 142 (H)  38 - 126 U/L Final   Total Bilirubin 01/24/2022 0.4  0.3 - 1.2 mg/dL Final   GFR, Estimated 01/24/2022 >60  >60 mL/min Final   Comment: (NOTE) Calculated using the CKD-EPI Creatinine Equation (2021)    Anion gap 01/24/2022 8  5 - 15 Final   Performed at [facility], [place] [address] [place], [place] [address]   Alcohol, Ethyl (B) 01/24/2022 268 (H)  <10 mg/dL Final   Comment: (NOTE) Lowest detectable limit for serum alcohol is 10 mg/dL.  For medical purposes only. Performed at [facility], [place] [address] [place], [place] [address]    Opiates 01/25/2022 NONE DETECTED  NONE DETECTED Final   Cocaine 01/25/2022 NONE DETECTED  NONE DETECTED Final   Benzodiazepines 01/25/2022 NONE DETECTED  NONE DETECTED Final   Amphetamines 01/25/2022 NONE DETECTED  NONE DETECTED Final   Tetrahydrocannabinol 01/25/2022 NONE DETECTED  NONE DETECTED Final   Barbiturates 01/25/2022 NONE DETECTED  NONE DETECTED Final   Comment: (NOTE) DRUG SCREEN FOR MEDICAL PURPOSES ONLY.  IF CONFIRMATION IS NEEDED FOR ANY PURPOSE, NOTIFY LAB WITHIN 5 DAYS.  LOWEST DETECTABLE LIMITS FOR URINE DRUG SCREEN Drug Class                     Cutoff (ng/mL) Amphetamine and metabolites    1000 Barbiturate and metabolites    200 Benzodiazepine                 200 Opiates and metabolites        300 Cocaine and metabolites        300 THC                            50 Performed at [facility], [place] [address] [place], [place] [address]    WBC 01/24/2022 4.8  4.0 - 10.5 K/uL Final   RBC 01/24/2022 3.48 (L)  4.22 - 5.81 MIL/uL Final   Hemoglobin 01/24/2022 9.5 (L)  13.0 - 17.0 g/dL Final   HCT 01/24/2022 30.0 (L)  39.0 - 52.0 % Final   MCV 01/24/2022 86.2  80.0 - 100.0 fL Final   MCH 01/24/2022 27.3  26.0 - 34.0 pg Final   MCHC 01/24/2022 31.7  30.0 - 36.0 g/dL Final   RDW 01/24/2022 16.6 (H)  11.5 - 15.5 % Final   Platelets 01/24/2022 165  150 - 400 K/uL Final   nRBC 01/24/2022 0.0  0.0 - 0.2 % Final   Neutrophils Relative % 01/24/2022 33  % Final   Neutro Abs 01/24/2022 1.6 (L)  1.7 - 7.7 K/uL Final   Lymphocytes Relative 01/24/2022 47  % Final   Lymphs Abs 01/24/2022 2.3  0.7 - 4.0 K/uL Final   Monocytes Relative 01/24/2022  15  % Final   Monocytes Absolute 01/24/2022 0.7  0.1 - 1.0 K/uL Final   Eosinophils Relative 01/24/2022 3  % Final   Eosinophils Absolute 01/24/2022 0.2  0.0 - 0.5 K/uL Final   Basophils Relative 01/24/2022 2  %  Final   Basophils Absolute 01/24/2022 0.1  0.0 - 0.1 K/uL Final   Immature Granulocytes 01/24/2022 0  % Final   Abs Immature Granulocytes 01/24/2022 0.00  0.00 - 0.07 K/uL Final   Performed at [facility], [place] [address] [place], [place] [address]   WBC 01/25/2022 4.6  4.0 - 10.5 K/uL Final   RBC 01/25/2022 4.00 (L)  4.22 - 5.81 MIL/uL Final   Hemoglobin 01/25/2022 10.6 (L)  13.0 - 17.0 g/dL Final   HCT 01/25/2022 34.3 (L)  39.0 - 52.0 % Final   MCV 01/25/2022 85.8  80.0 - 100.0 fL Final   MCH 01/25/2022 26.5  26.0 - 34.0 pg Final   MCHC 01/25/2022 30.9  30.0 - 36.0 g/dL Final   RDW 01/25/2022 16.5 (H)  11.5 - 15.5 % Final   Platelets 01/25/2022 199  150 - 400 K/uL Final   nRBC 01/25/2022 0.0  0.0 - 0.2 % Final   Performed at [facility], [place] [address] [place], [place] [address]  Admission on 01/22/2022, Discharged on 01/23/2022  Component Date Value Ref Range Status   Sodium 01/22/2022 139  135 - 145 mmol/L Final   Potassium 01/22/2022 4.2  3.5 - 5.1 mmol/L Final   Chloride 01/22/2022 102  98 - 111 mmol/L Final   CO2 01/22/2022 21 (L)  22 - 32 mmol/L Final   Glucose, Bld 01/22/2022 105 (H)  70 - 99 mg/dL Final   Glucose reference range applies only to samples taken after fasting for at least 8 hours.   BUN 01/22/2022 8  6 - 20 mg/dL Final   Creatinine, Ser 01/22/2022 0.72  0.61 - 1.24 mg/dL Final   Calcium 01/22/2022 9.7  8.9 - 10.3 mg/dL Final   Total Protein 01/22/2022 8.0  6.5 - 8.1 g/dL Final   Albumin 01/22/2022 4.1  3.5 - 5.0 g/dL Final   AST 01/22/2022 216 (H)  15 - 41 U/L Final   ALT 01/22/2022 115 (H)  0 - 44 U/L Final   Alkaline Phosphatase 01/22/2022 186 (H)  38 - 126 U/L Final   Total Bilirubin 01/22/2022 0.5  0.3 -  1.2 mg/dL Final   GFR, Estimated 01/22/2022 >60  >60 mL/min Final   Comment: (NOTE) Calculated using the CKD-EPI Creatinine Equation (2021)    Anion gap 01/22/2022 16 (H)  5 - 15 Final   Performed at [facility] [address] [place], [place] [address]   WBC 01/22/2022 5.9  4.0 - 10.5 K/uL Final   RBC 01/22/2022 4.09 (L)  4.22 - 5.81 MIL/uL Final   Hemoglobin 01/22/2022 10.9 (L)  13.0 - 17.0 g/dL Final   HCT 01/22/2022 35.1 (L)  39.0 - 52.0 % Final   MCV 01/22/2022 85.8  80.0 - 100.0 fL Final   MCH 01/22/2022 26.7  26.0 - 34.0 pg Final   MCHC 01/22/2022 31.1  30.0 - 36.0 g/dL Final   RDW 01/22/2022 16.5 (H)  11.5 - 15.5 % Final   Platelets 01/22/2022 192  150 - 400 K/uL Final   nRBC 01/22/2022 0.0  0.0 - 0.2 % Final   Neutrophils Relative % 01/22/2022 43  % Final   Neutro Abs 01/22/2022 2.5  1.7 - 7.7 K/uL Final   Lymphocytes Relative 01/22/2022 38  % Final   Lymphs Abs 01/22/2022 2.2  0.7 - 4.0 K/uL Final   Monocytes Relative 01/22/2022 16  % Final   Monocytes Absolute 01/22/2022 0.9  0.1 - 1.0  K/uL Final   Eosinophils Relative 01/22/2022 2  % Final   Eosinophils Absolute 01/22/2022 0.1  0.0 - 0.5 K/uL Final   Basophils Relative 01/22/2022 1  % Final   Basophils Absolute 01/22/2022 0.1  0.0 - 0.1 K/uL Final   Immature Granulocytes 01/22/2022 0  % Final   Abs Immature Granulocytes 01/22/2022 0.02  0.00 - 0.07 K/uL Final   Performed at [facility], [place] [address], [place], [place] [address]   Alcohol, Ethyl (B) 01/22/2022 340 (HH)  <10 mg/dL Final   Comment: CRITICAL RESULT CALLED TO, READ BACK BY AND VERIFIED WITH Assunta Found, RN, 2232 01/22/22, A. RAMSEY (NOTE) Lowest detectable limit for serum alcohol is 10 mg/dL.  For medical purposes only. Performed at [facility], [place] [address], [place], [place] [address]    Opiates 01/22/2022 NONE DETECTED  NONE DETECTED Final   Cocaine 01/22/2022 NONE DETECTED  NONE DETECTED Final   Benzodiazepines 01/22/2022 NONE  DETECTED  NONE DETECTED Final   Amphetamines 01/22/2022 NONE DETECTED  NONE DETECTED Final   Tetrahydrocannabinol 01/22/2022 NONE DETECTED  NONE DETECTED Final   Barbiturates 01/22/2022 NONE DETECTED  NONE DETECTED Final   Comment: (NOTE) DRUG SCREEN FOR MEDICAL PURPOSES ONLY.  IF CONFIRMATION IS NEEDED FOR ANY PURPOSE, NOTIFY LAB WITHIN 5 DAYS.  LOWEST DETECTABLE LIMITS FOR URINE DRUG SCREEN Drug Class                     Cutoff (ng/mL) Amphetamine and metabolites    1000 Barbiturate and metabolites    200 Benzodiazepine                 200 Opiates and metabolites        300 Cocaine and metabolites        300 THC                            50 Performed at [facility], [place] [address], [place], [place] [address]    Color, Urine 01/22/2022 COLORLESS (A)  YELLOW Final   APPearance 01/22/2022 CLEAR  CLEAR Final   Specific Gravity, Urine 01/22/2022 1.002 (L)  1.005 - 1.030 Final   pH 01/22/2022 5.0  5.0 - 8.0 Final   Glucose, UA 01/22/2022 NEGATIVE  NEGATIVE mg/dL Final   Hgb urine dipstick 01/22/2022 NEGATIVE  NEGATIVE Final   Bilirubin Urine 01/22/2022 NEGATIVE  NEGATIVE Final   Ketones, ur 01/22/2022 NEGATIVE  NEGATIVE mg/dL Final   Protein, ur 01/22/2022 NEGATIVE  NEGATIVE mg/dL Final   Nitrite 01/22/2022 NEGATIVE  NEGATIVE Final   Leukocytes,Ua 01/22/2022 NEGATIVE  NEGATIVE Final   Performed at [place] [address] [place], [place] [address]  Admission on 01/19/2022, Discharged on 01/21/2022  Component Date Value Ref Range Status   SARS Coronavirus 2 by RT PCR 01/19/2022 NEGATIVE  NEGATIVE Final   Comment: (NOTE) SARS-CoV-2 target nucleic acids are NOT DETECTED.  The SARS-CoV-2 RNA is generally detectable in upper respiratory specimens during the acute phase of infection. The lowest concentration of SARS-CoV-2 viral copies this assay can detect is 138 copies/mL. A negative result does not preclude SARS-Cov-2 infection and should not be used as the  sole basis for treatment or other patient management decisions. A negative result may occur with  improper specimen collection/handling, submission of specimen other than nasopharyngeal swab, presence of viral mutation(s) within the areas targeted by this assay, and inadequate  number of viral copies(<138 copies/mL). A negative result must be combined with clinical observations, patient history, and epidemiological information. The expected result is Negative.  Fact Sheet for Patients:  EntrepreneurPulse.com.au  Fact Sheet for Healthcare Providers:  IncredibleEmployment.be  This test is no                          t yet approved or cleared by the [place] FDA and  has been authorized for detection and/or diagnosis of SARS-CoV-2 by FDA under an Emergency Use Authorization (EUA). This EUA will remain  in effect (meaning this test can be used) for the duration of the COVID-19 declaration under Section 564(b)(1) of the Act, 21 U.S.C.section 360bbb-3(b)(1), unless the authorization is terminated  or revoked sooner.       Influenza A by PCR 01/19/2022 NEGATIVE  NEGATIVE Final   Influenza B by PCR 01/19/2022 NEGATIVE  NEGATIVE Final   Comment: (NOTE) The Xpert Xpress SARS-CoV-2/FLU/RSV plus assay is intended as an aid in the diagnosis of influenza from Nasopharyngeal swab specimens and should not be used as a sole basis for treatment. Nasal washings and aspirates are unacceptable for Xpert Xpress SARS-CoV-2/FLU/RSV testing.  Fact Sheet for Patients: EntrepreneurPulse.com.au  Fact Sheet for Healthcare Providers: IncredibleEmployment.be  This test is not yet approved or cleared by the [place] FDA and has been authorized for detection and/or diagnosis of SARS-CoV-2 by FDA under an Emergency Use Authorization (EUA). This EUA will remain in effect (meaning this test can be used) for the duration of  the COVID-19 declaration under Section 564(b)(1) of the Act, 21 U.S.C. section 360bbb-3(b)(1), unless the authorization is terminated or revoked.  Performed at [facility], [place] [address], [place], [place] [address]    WBC 01/19/2022 4.8  4.0 - 10.5 K/uL Final   RBC 01/19/2022 3.95 (L)  4.22 - 5.81 MIL/uL Final   Hemoglobin 01/19/2022 10.8 (L)  13.0 - 17.0 g/dL Final   HCT 01/19/2022 33.2 (L)  39.0 - 52.0 % Final   MCV 01/19/2022 84.1  80.0 - 100.0 fL Final   MCH 01/19/2022 27.3  26.0 - 34.0 pg Final   MCHC 01/19/2022 32.5  30.0 - 36.0 g/dL Final   RDW 01/19/2022 16.7 (H)  11.5 - 15.5 % Final   Platelets 01/19/2022 161  150 - 400 K/uL Final   nRBC 01/19/2022 0.0  0.0 - 0.2 % Final   Neutrophils Relative % 01/19/2022 40  % Final   Neutro Abs 01/19/2022 2.0  1.7 - 7.7 K/uL Final   Lymphocytes Relative 01/19/2022 38  % Final   Lymphs Abs 01/19/2022 1.8  0.7 - 4.0 K/uL Final   Monocytes Relative 01/19/2022 17  % Final   Monocytes Absolute 01/19/2022 0.8  0.1 - 1.0 K/uL Final   Eosinophils Relative 01/19/2022 3  % Final   Eosinophils Absolute 01/19/2022 0.1  0.0 - 0.5 K/uL Final   Basophils Relative 01/19/2022 2  % Final   Basophils Absolute 01/19/2022 0.1  0.0 - 0.1 K/uL Final   Immature Granulocytes 01/19/2022 0  % Final   Abs Immature Granulocytes 01/19/2022 0.02  0.00 - 0.07 K/uL Final   Performed at [facility], [place] [address], [place], [place] [address]   Sodium 01/19/2022 138  135 - 145 mmol/L Final   Potassium 01/19/2022 3.8  3.5 - 5.1 mmol/L Final   Chloride 01/19/2022 102  98 - 111 mmol/L Final   CO2 01/19/2022 22  22 - 32 mmol/L Final   Glucose, Bld 01/19/2022 89  70 - 99 mg/dL Final   Glucose reference range applies only to samples taken after fasting for at least 8 hours.   BUN 01/19/2022 7  6 - 20 mg/dL Final   Creatinine, Ser 01/19/2022 0.64  0.61 - 1.24 mg/dL Final   Calcium 01/19/2022 9.2  8.9 - 10.3 mg/dL Final   Total Protein 01/19/2022 8.1  6.5 -  8.1 g/dL Final   Albumin 01/19/2022 4.0  3.5 - 5.0 g/dL Final   AST 01/19/2022 140 (H)  15 - 41 U/L Final   ALT 01/19/2022 68 (H)  0 - 44 U/L Final   Alkaline Phosphatase 01/19/2022 144 (H)  38 - 126 U/L Final   Total Bilirubin 01/19/2022 0.4  0.3 - 1.2 mg/dL Final   GFR, Estimated 01/19/2022 >60  >60 mL/min Final   Comment: (NOTE) Calculated using the CKD-EPI Creatinine Equation (2021)    Anion gap 01/19/2022 14  5 - 15 Final   Performed at [place] [address] [place], [place] [address]   Hgb A1c MFr Bld 01/19/2022 5.3  4.8 - 5.6 % Final   Comment: (NOTE) Pre diabetes:          5.7%-6.4%  Diabetes:              >6.4%  Glycemic control for   <7.0% adults with diabetes    Mean Plasma Glucose 01/19/2022 105.41  mg/dL Final   Performed at [facility], [place] [address], [place], [place] [address]   Alcohol, Ethyl (B) 01/19/2022 290 (H)  <10 mg/dL Final   Comment: (NOTE) Lowest detectable limit for serum alcohol is 10 mg/dL.  For medical purposes only. Performed at [facility], [place] [address], [place], [place] [address]    Cholesterol 01/19/2022 251 (H)  0 - 200 mg/dL Final   Triglycerides 01/19/2022 163 (H)  <150 mg/dL Final   HDL 01/19/2022 105  >40 mg/dL Final   Total CHOL/HDL Ratio 01/19/2022 2.4  RATIO Final   VLDL 01/19/2022 33  0 - 40 mg/dL Final   LDL Cholesterol 01/19/2022 113 (H)  0 - 99 mg/dL Final   Comment:        Total Cholesterol/HDL:CHD Risk Coronary Heart Disease Risk Table                     Men   Women  1/2 Average Risk   3.4   3.3  Average Risk       5.0   4.4  2 X Average Risk   9.6   7.1  3 X Average Risk  23.4   11.0        Use the calculated Patient Ratio above and the CHD Risk Table to determine the patient's CHD Risk.        ATP III CLASSIFICATION (LDL):  <100     mg/dL   Optimal  100-129  mg/dL   Near or Above                    Optimal  130-159  mg/dL   Borderline  160-189  mg/dL   High  >190     mg/dL   Very  High Performed at [place] [address], [place], [place] [address]    TSH 01/19/2022 0.415  0.350 - 4.500 uIU/mL Final   Comment: Performed by a 3rd Generation assay  with a functional sensitivity of <=0.01 uIU/mL. Performed at [facility], [place] [address], [place], [place] [address]    POC Amphetamine UR 01/19/2022 None Detected  NONE DETECTED (Cut Off Level 1000 ng/mL) Final   POC Secobarbital (BAR) 01/19/2022 None Detected  NONE DETECTED (Cut Off Level 300 ng/mL) Final   POC Buprenorphine (BUP) 01/19/2022 None Detected  NONE DETECTED (Cut Off Level 10 ng/mL) Final   POC Oxazepam (BZO) 01/19/2022 None Detected  NONE DETECTED (Cut Off Level 300 ng/mL) Final   POC Cocaine UR 01/19/2022 None Detected  NONE DETECTED (Cut Off Level 300 ng/mL) Final   POC Methamphetamine UR 01/19/2022 None Detected  NONE DETECTED (Cut Off Level 1000 ng/mL) Final   POC Morphine 01/19/2022 None Detected  NONE DETECTED (Cut Off Level 300 ng/mL) Final   POC Methadone UR 01/19/2022 None Detected  NONE DETECTED (Cut Off Level 300 ng/mL) Final   POC Oxycodone UR 01/19/2022 None Detected  NONE DETECTED (Cut Off Level 100 ng/mL) Final   POC Marijuana UR 01/19/2022 None Detected  NONE DETECTED (Cut Off Level 50 ng/mL) Final   SARSCOV2ONAVIRUS 2 AG 01/19/2022 NEGATIVE  NEGATIVE Final   Comment: (NOTE) SARS-CoV-2 antigen NOT DETECTED.   Negative results are presumptive.  Negative results do not preclude SARS-CoV-2 infection and should not be used as the sole basis for treatment or other patient management decisions, including infection  control decisions, particularly in the presence of clinical signs and  symptoms consistent with COVID-19, or in those who have been in contact with the virus.  Negative results must be combined with clinical observations, patient history, and epidemiological information. The expected result is Negative.  Fact Sheet for Patients:  HandmadeRecipes.com.cy  Fact Sheet for Healthcare Providers: FuneralLife.at  This test is not yet approved or cleared by the [place] FDA and  has been authorized for detection and/or diagnosis of SARS-CoV-2 by FDA under an Emergency Use Authorization (EUA).  This EUA will remain in effect (meaning this test can be used) for the duration of  the COV                          ID-19 declaration under Section 564(b)(1) of the Act, 21 U.S.C. section 360bbb-3(b)(1), unless the authorization is terminated or revoked sooner.    Admission on 01/11/2022, Discharged on 01/12/2022  Component Date Value Ref Range Status   WBC 01/11/2022 6.0  4.0 - 10.5 K/uL Final   RBC 01/11/2022 3.96 (L)  4.22 - 5.81 MIL/uL Final   Hemoglobin 01/11/2022 11.1 (L)  13.0 - 17.0 g/dL Final   HCT 01/11/2022 33.1 (L)  39.0 - 52.0 % Final   MCV 01/11/2022 83.6  80.0 - 100.0 fL Final   MCH 01/11/2022 28.0  26.0 - 34.0 pg Final   MCHC 01/11/2022 33.5  30.0 - 36.0 g/dL Final   RDW 01/11/2022 17.0 (H)  11.5 - 15.5 % Final   Platelets 01/11/2022 115 (L)  150 - 400 K/uL Final   nRBC 01/11/2022 0.0  0.0 - 0.2 % Final   Neutrophils Relative % 01/11/2022 54  % Final   Neutro Abs 01/11/2022 3.3  1.7 - 7.7 K/uL Final   Lymphocytes Relative 01/11/2022 29  % Final   Lymphs Abs 01/11/2022 1.7  0.7 - 4.0 K/uL Final   Monocytes Relative 01/11/2022 14  % Final   Monocytes Absolute 01/11/2022 0.8  0.1 - 1.0 K/uL Final   Eosinophils Relative 01/11/2022 1  %  Final   Eosinophils Absolute 01/11/2022 0.1  0.0 - 0.5 K/uL Final   Basophils Relative 01/11/2022 2  % Final   Basophils Absolute 01/11/2022 0.1  0.0 - 0.1 K/uL Final   Immature Granulocytes 01/11/2022 0  % Final   Abs Immature Granulocytes 01/11/2022 0.02  0.00 - 0.07 K/uL Final   Performed at [facility], [place] [address], [place], [place] [address]   Sodium 01/11/2022 136  135 - 145 mmol/L Final   Potassium 01/11/2022  3.7  3.5 - 5.1 mmol/L Final   Chloride 01/11/2022 101  98 - 111 mmol/L Final   CO2 01/11/2022 21 (L)  22 - 32 mmol/L Final   Glucose, Bld 01/11/2022 106 (H)  70 - 99 mg/dL Final   Glucose reference range applies only to samples taken after fasting for at least 8 hours.   BUN 01/11/2022 5 (L)  6 - 20 mg/dL Final   Creatinine, Ser 01/11/2022 0.66  0.61 - 1.24 mg/dL Final   Calcium 01/11/2022 8.5 (L)  8.9 - 10.3 mg/dL Final   Total Protein 01/11/2022 8.0  6.5 - 8.1 g/dL Final   Albumin 01/11/2022 4.1  3.5 - 5.0 g/dL Final   AST 01/11/2022 149 (H)  15 - 41 U/L Final   ALT 01/11/2022 63 (H)  0 - 44 U/L Final   Alkaline Phosphatase 01/11/2022 131 (H)  38 - 126 U/L Final   Total Bilirubin 01/11/2022 0.7  0.3 - 1.2 mg/dL Final   GFR, Estimated 01/11/2022 >60  >60 mL/min Final   Comment: (NOTE) Calculated using the CKD-EPI Creatinine Equation (2021)    Anion gap 01/11/2022 14  5 - 15 Final   Performed at [facility] [address] [place], [place] [address]   Alcohol, Ethyl (B) 01/11/2022 351 (HH)  <10 mg/dL Final   Comment: CRITICAL RESULT CALLED TO, READ BACK BY AND VERIFIED WITH B. SANGALANG RN, 0014, 01/12/22, EADEDOKUN (NOTE) Lowest detectable limit for serum alcohol is 10 mg/dL.  For medical purposes only. Performed at [facility], [place] [address] [place], [place] [address]   Admission on 12/08/2021, Discharged on 12/09/2021  Component Date Value Ref Range Status   Sodium 12/08/2021 140  135 - 145 mmol/L Final   Potassium 12/08/2021 3.8  3.5 - 5.1 mmol/L Final   Chloride 12/08/2021 103  98 - 111 mmol/L Final   CO2 12/08/2021 24  22 - 32 mmol/L Final   Glucose, Bld 12/08/2021 93  70 - 99 mg/dL Final   Glucose reference range applies only to samples taken after fasting for at least 8 hours.   BUN 12/08/2021 5 (L)  6 - 20 mg/dL Final   Creatinine, Ser 12/08/2021 0.66  0.61 - 1.24 mg/dL Final   Calcium 12/08/2021 8.7 (L)  8.9 - 10.3 mg/dL Final   Total Protein  12/08/2021 8.0  6.5 - 8.1 g/dL Final   Albumin 12/08/2021 4.2  3.5 - 5.0 g/dL Final   AST 12/08/2021 176 (H)  15 - 41 U/L Final   ALT 12/08/2021 75 (H)  0 - 44 U/L Final   Alkaline Phosphatase 12/08/2021 152 (H)  38 - 126 U/L Final   Total Bilirubin 12/08/2021 0.7  0.3 - 1.2 mg/dL Final   GFR, Estimated 12/08/2021 >60  >60 mL/min Final   Comment: (NOTE) Calculated using the CKD-EPI Creatinine Equation (2021)    Anion gap 12/08/2021 13  5 - 15 Final   Performed at [facility],  [place] [address], [place], [place] [address]   Alcohol, Ethyl (B) 12/08/2021 372 (HH)  <10 mg/dL Final   Comment: CRITICAL RESULT CALLED TO, READ BACK BY AND VERIFIED WITH RIVERS,C AT 2216 ON 12/08/21 BY [email or web address] (NOTE) Lowest detectable limit for serum alcohol is 10 mg/dL.  For medical purposes only. Performed at [facility], [place] [address] [place], [place] [address]    Salicylate Lvl 46/50/3546 <7.0 (L)  7.0 - 30.0 mg/dL Final   Performed at [place] [address] [place], [place] [address]   Acetaminophen (Tylenol), Serum 12/08/2021 <10 (L)  10 - 30 ug/mL Final   Comment: (NOTE) Therapeutic concentrations vary significantly. A range of 10-30 ug/mL  may be an effective concentration for many patients. However, some  are best treated at concentrations outside of this range. Acetaminophen concentrations >150 ug/mL at 4 hours after ingestion  and >50 ug/mL at 12 hours after ingestion are often associated with  toxic reactions.  Performed at [facility], [place] [address], [place], [place] [address]    WBC 12/08/2021 4.0  4.0 - 10.5 K/uL Final   RBC 12/08/2021 4.26  4.22 - 5.81 MIL/uL Final   Hemoglobin 12/08/2021 11.6 (L)  13.0 - 17.0 g/dL Final   HCT 12/08/2021 36.1 (L)  39.0 - 52.0 % Final   MCV 12/08/2021 84.7  80.0 - 100.0 fL Final   MCH 12/08/2021 27.2  26.0 - 34.0 pg Final   MCHC 12/08/2021 32.1  30.0 - 36.0 g/dL Final    RDW 12/08/2021 17.2 (H)  11.5 - 15.5 % Final   Platelets 12/08/2021 120 (L)  150 - 400 K/uL Final   nRBC 12/08/2021 0.0  0.0 - 0.2 % Final   Performed at [facility], [place] [address], [place], [place] [address]   Opiates 12/08/2021 NONE DETECTED  NONE DETECTED Final   Cocaine 12/08/2021 NONE DETECTED  NONE DETECTED Final   Benzodiazepines 12/08/2021 NONE DETECTED  NONE DETECTED Final   Amphetamines 12/08/2021 NONE DETECTED  NONE DETECTED Final   Tetrahydrocannabinol 12/08/2021 NONE DETECTED  NONE DETECTED Final   Barbiturates 12/08/2021 NONE DETECTED  NONE DETECTED Final   Comment: (NOTE) DRUG SCREEN FOR MEDICAL PURPOSES ONLY.  IF CONFIRMATION IS NEEDED FOR ANY PURPOSE, NOTIFY LAB WITHIN 5 DAYS.  LOWEST DETECTABLE LIMITS FOR URINE DRUG SCREEN Drug Class                     Cutoff (ng/mL) Amphetamine and metabolites    1000 Barbiturate and metabolites    200 Benzodiazepine                 449 Tricyclics and metabolites     300 Opiates and metabolites        300 Cocaine and metabolites        300 THC                            50 Performed at [facility], [place] [address] [place],  [address]   Admission on 12/06/2021, Discharged on 12/07/2021  Component Date Value Ref Range Status   Sodium 12/06/2021 138  135 - 145 mmol/L Final   Potassium 12/06/2021 3.6  3.5 - 5.1 mmol/L Final   Chloride 12/06/2021 103  98 - 111 mmol/L Final   CO2 12/06/2021 26  22 - 32 mmol/L Final   Glucose, Bld 12/06/2021 94  70 -  99 mg/dL Final   Glucose reference range applies only to samples taken after fasting for at least 8 hours.   BUN 12/06/2021 <5 (L)  6 - 20 mg/dL Final   Creatinine, Ser 12/06/2021 0.77  0.61 - 1.24 mg/dL Final   Calcium 12/06/2021 9.0  8.9 - 10.3 mg/dL Final   Total Protein 12/06/2021 8.2 (H)  6.5 - 8.1 g/dL Final   Albumin 12/06/2021 4.1  3.5 - 5.0 g/dL Final   AST 12/06/2021 149 (H)  15 - 41 U/L Final   ALT 12/06/2021 66 (H)  0 -  44 U/L Final   Alkaline Phosphatase 12/06/2021 147 (H)  38 - 126 U/L Final   Total Bilirubin 12/06/2021 0.6  0.3 - 1.2 mg/dL Final   GFR, Estimated 12/06/2021 >60  >60 mL/min Final   Comment: (NOTE) Calculated using the CKD-EPI Creatinine Equation (2021)    Anion gap 12/06/2021 9  5 - 15 Final   Performed at [facility], [place] [address], [place], [place] [address]   Alcohol, Ethyl (B) 12/06/2021 380 (HH)  <10 mg/dL Final   Comment: CRITICAL RESULT CALLED TO, READ BACK BY AND VERIFIED WITH GESELL, K @ 2003 [id number] JMK (NOTE) Lowest detectable limit for serum alcohol is 10 mg/dL.  For medical purposes only. Performed at [facility], [place] [address], [place], [place] [address]    WBC 12/06/2021 4.8  4.0 - 10.5 K/uL Final   RBC 12/06/2021 4.41  4.22 - 5.81 MIL/uL Final   Hemoglobin 12/06/2021 12.1 (L)  13.0 - 17.0 g/dL Final   HCT 12/06/2021 37.9 (L)  39.0 - 52.0 % Final   MCV 12/06/2021 85.9  80.0 - 100.0 fL Final   MCH 12/06/2021 27.4  26.0 - 34.0 pg Final   MCHC 12/06/2021 31.9  30.0 - 36.0 g/dL Final   RDW 12/06/2021 17.2 (H)  11.5 - 15.5 % Final   Platelets 12/06/2021 114 (L)  150 - 400 K/uL Final   nRBC 12/06/2021 0.0  0.0 - 0.2 % Final   Performed at [facility], [place] [address], [place], [place] [address]   Magnesium 12/06/2021 2.2  1.7 - 2.4 mg/dL Final   Performed at [place] [address], [place], [place] [address]  Admission on 11/18/2021, Discharged on 11/18/2021  Component Date Value Ref Range Status   WBC 11/18/2021 4.5  4.0 - 10.5 K/uL Final   RBC 11/18/2021 4.24  4.22 - 5.81 MIL/uL Final   Hemoglobin 11/18/2021 11.7 (L)  13.0 - 17.0 g/dL Final   HCT 11/18/2021 36.1 (L)  39.0 - 52.0 % Final   MCV 11/18/2021 85.1  80.0 - 100.0 fL Final   MCH 11/18/2021 27.6  26.0 - 34.0 pg Final   MCHC 11/18/2021 32.4  30.0 - 36.0 g/dL Final   RDW 11/18/2021 16.4 (H)  11.5 - 15.5 % Final   Platelets  11/18/2021 107 (L)  150 - 400 K/uL Final   nRBC 11/18/2021 0.0  0.0 - 0.2 % Final   Neutrophils Relative % 11/18/2021 43  % Final   Neutro Abs 11/18/2021 2.0  1.7 - 7.7 K/uL Final   Lymphocytes Relative 11/18/2021 40  % Final   Lymphs Abs 11/18/2021 1.8  0.7 - 4.0 K/uL Final   Monocytes Relative 11/18/2021 12  % Final   Monocytes Absolute 11/18/2021 0.5  0.1 - 1.0 K/uL Final   Eosinophils Relative 11/18/2021 3  % Final   Eosinophils Absolute 11/18/2021 0.1  0.0 - 0.5 K/uL Final   Basophils Relative 11/18/2021 2  % Final   Basophils Absolute 11/18/2021 0.1  0.0 - 0.1 K/uL Final   Immature Granulocytes 11/18/2021 0  % Final   Abs Immature Granulocytes 11/18/2021 0.01  0.00 - 0.07 K/uL Final   Performed at [facility], [place] [address], [place], [place] [address]   Sodium 11/18/2021 140  135 - 145 mmol/L Final   Potassium 11/18/2021 3.5  3.5 - 5.1 mmol/L Final   Chloride 11/18/2021 108  98 - 111 mmol/L Final   CO2 11/18/2021 21 (L)  22 - 32 mmol/L Final   Glucose, Bld 11/18/2021 90  70 - 99 mg/dL Final   Glucose reference range applies only to samples taken after fasting for at least 8 hours.   BUN 11/18/2021 <5 (L)  6 - 20 mg/dL Final   Creatinine, Ser 11/18/2021 0.71  0.61 - 1.24 mg/dL Final   Calcium 11/18/2021 8.4 (L)  8.9 - 10.3 mg/dL Final   Total Protein 11/18/2021 7.4  6.5 - 8.1 g/dL Final   Albumin 11/18/2021 3.7  3.5 - 5.0 g/dL Final   AST 11/18/2021 91 (H)  15 - 41 U/L Final   ALT 11/18/2021 48 (H)  0 - 44 U/L Final   Alkaline Phosphatase 11/18/2021 178 (H)  38 - 126 U/L Final   Total Bilirubin 11/18/2021 0.5  0.3 - 1.2 mg/dL Final   GFR, Estimated 11/18/2021 >60  >60 mL/min Final   Comment: (NOTE) Calculated using the CKD-EPI Creatinine Equation (2021)    Anion gap 11/18/2021 11  5 - 15 Final   Performed at [facility], [place] [address] [place], [place] [address]   Alcohol, Ethyl (B) 11/18/2021 336 (HH)  <10 mg/dL Final   Comment: CRITICAL RESULT CALLED TO,  READ BACK BY AND VERIFIED WITH DESMOND JAY,RN AT 1455 11/18/2021 BY ZBEECH. (NOTE) Lowest detectable limit for serum alcohol is 10 mg/dL.  For medical purposes only. Performed at [facility], [place] [address], [place],  [address]    Glucose-Capillary 11/18/2021 98  70 - 99 mg/dL Final   Glucose reference range applies only to samples taken after fasting for at least 8 hours.   Comment 1 11/18/2021 Notify RN   Final   Comment 2 11/18/2021 Document in Chart   Final  Admission on 11/16/2021, Discharged on 11/16/2021  Component Date Value Ref Range Status   WBC 11/16/2021 4.1  4.0 - 10.5 K/uL Final   RBC 11/16/2021 4.20 (L)  4.22 - 5.81 MIL/uL Final   Hemoglobin 11/16/2021 11.5 (L)  13.0 - 17.0 g/dL Final   HCT 11/16/2021 35.3 (L)  39.0 - 52.0 % Final   MCV 11/16/2021 84.0  80.0 - 100.0 fL Final   MCH 11/16/2021 27.4  26.0 - 34.0 pg Final   MCHC 11/16/2021 32.6  30.0 - 36.0 g/dL Final   RDW 11/16/2021 16.3 (H)  11.5 - 15.5 % Final   Platelets 11/16/2021 127 (L)  150 - 400 K/uL Final   nRBC 11/16/2021 0.0  0.0 - 0.2 % Final   Neutrophils Relative % 11/16/2021 38  % Final   Neutro Abs 11/16/2021 1.5 (L)  1.7 - 7.7 K/uL Final   Lymphocytes Relative 11/16/2021 45  % Final   Lymphs Abs 11/16/2021 1.9  0.7 - 4.0 K/uL Final   Monocytes Relative 11/16/2021 12  % Final   Monocytes Absolute 11/16/2021 0.5  0.1 - 1.0 K/uL Final  Eosinophils Relative 11/16/2021 3  % Final   Eosinophils Absolute 11/16/2021 0.1  0.0 - 0.5 K/uL Final   Basophils Relative 11/16/2021 2  % Final   Basophils Absolute 11/16/2021 0.1  0.0 - 0.1 K/uL Final   Immature Granulocytes 11/16/2021 0  % Final   Abs Immature Granulocytes 11/16/2021 0.01  0.00 - 0.07 K/uL Final   Performed at [facility], [place] [address], [place], [place] [address]   Sodium 11/16/2021 140  135 - 145 mmol/L Final   Potassium 11/16/2021 3.7  3.5 - 5.1 mmol/L Final   Chloride 11/16/2021 107  98 - 111 mmol/L Final   CO2 11/16/2021  22  22 - 32 mmol/L Final   Glucose, Bld 11/16/2021 93  70 - 99 mg/dL Final   Glucose reference range applies only to samples taken after fasting for at least 8 hours.   BUN 11/16/2021 <5 (L)  6 - 20 mg/dL Final   Creatinine, Ser 11/16/2021 0.77  0.61 - 1.24 mg/dL Final   Calcium 11/16/2021 8.8 (L)  8.9 - 10.3 mg/dL Final   Total Protein 11/16/2021 7.8  6.5 - 8.1 g/dL Final   Albumin 11/16/2021 4.0  3.5 - 5.0 g/dL Final   AST 11/16/2021 103 (H)  15 - 41 U/L Final   ALT 11/16/2021 52 (H)  0 - 44 U/L Final   Alkaline Phosphatase 11/16/2021 197 (H)  38 - 126 U/L Final   Total Bilirubin 11/16/2021 0.4  0.3 - 1.2 mg/dL Final   GFR, Estimated 11/16/2021 >60  >60 mL/min Final   Comment: (NOTE) Calculated using the CKD-EPI Creatinine Equation (2021)    Anion gap 11/16/2021 11  5 - 15 Final   Performed at [facility], [place] [address] [place], [place] [address]  There may be more visits with results that are not included.    Blood Alcohol level:  Lab Results  Component Value Date   ETH 336 (HH) 01/26/2022   ETH 268 (H) 16/60/6301    Metabolic Disorder Labs: Lab Results  Component Value Date   HGBA1C 5.3 01/19/2022   MPG 105.41 01/19/2022   MPG 116.89 07/17/2021   Lab Results  Component Value Date   PROLACTIN 23.2 (H) 03/07/2016   Lab Results  Component Value Date   CHOL 251 (H) 01/19/2022   TRIG 163 (H) 01/19/2022   HDL 105 01/19/2022   CHOLHDL 2.4 01/19/2022   VLDL 33 01/19/2022   LDLCALC 113 (H) 01/19/2022   LDLCALC 66 07/17/2021    Therapeutic Lab Levels: No results found for: "LITHIUM" No results found for: "VALPROATE" No results found for: "CBMZ"  Physical Findings   AIMS    Flowsheet Row Admission (Discharged) from 07/16/2017 in [place] [facility] Admission (Discharged) from 05/15/2017 in [place] [facility] Admission (Discharged) from 07/06/2016 in [place] [facility]B Admission  (Discharged) from 06/15/2016 in [employer] [facility] Admission (Discharged) from 03/05/2016 in [place] [facility]  AIMS Total Score 0 0 0 0 0      AUDIT    Flowsheet Row Admission (Discharged) from 07/16/2021 in [place] [facility] Admission (Discharged) from 07/16/2017 in [place] [facility] Admission (Discharged) from 05/15/2017 in [place] [facility] Admission (Discharged) from 07/06/2016 in [place] 300B Admission (Discharged) from 06/15/2016 in [place] 400B  Alcohol Use Disorder Identification Test  Final Score (AUDIT) 23 40 30 36 37      [place] Office Visit from 02/01/2019 in [place]  Total GAD-7 Score 7      PHQ2-9    [place] ED from 01/27/2022 in [facility] ED from 01/19/2022 in [facility] ED from 08/07/2021 in [place] DEPT ED from 09/04/2020 in [place] ED from 06/17/2020 in [place] DEPT  PHQ-2 Total Score _0 PHQ-9 Total Score _1 Flowsheet Row ED from 01/27/2022 in [facility] ED from 01/26/2022 in [place] [facility]PT ED from 01/24/2022 in [place] DEPT  C-SSRS RISK CATEGORY No Risk High Risk High Risk        Musculoskeletal  Strength & Muscle Tone: within normal limits Gait & Station: normal Patient leans: N/A  Psychiatric Specialty Exam  Presentation  General Appearance:  Casual; Disheveled  Eye Contact: Good  Speech: Clear and Coherent; Normal Rate  Speech Volume: Normal  Handedness: Right   Mood and Affect  Mood: --  (gray)  Affect: Appropriate; Congruent; Full Range   Thought Process  Thought Processes: Coherent; Goal Directed; Linear  Descriptions of Associations:Intact  Orientation:Full (Time, Place and Person)  Thought Content:WDL  Diagnosis of Schizophrenia or Schizoaffective disorder in past: No    Hallucinations:Hallucinations: None (Denied AVH)  Ideas of Reference:None  Suicidal Thoughts:Suicidal Thoughts: No (Contracted to safety) SI Passive Intent and/or Plan: -- (Denied)  Homicidal Thoughts:Homicidal Thoughts: No   Sensorium  Memory: Immediate Fair  Judgment: Fair  Insight: Fair   Executive Functions  Concentration: Good  Attention Span: Good  Recall: Good  Fund of Knowledge: Good  Language: Good   Psychomotor Activity  Psychomotor Activity: Psychomotor Activity: Normal   Assets  Assets: Communication Skills; Desire for Improvement; Social Support   Sleep  Sleep: Sleep: Good  Physical Exam  Physical Exam Vitals and nursing note reviewed.  Constitutional:      General: He is not in acute distress.    Appearance: Normal appearance. He is normal weight. He is not ill-appearing, toxic-appearing or diaphoretic.  HENT:     Head: Normocephalic and atraumatic.  Pulmonary:     Effort: Pulmonary effort is normal.  Neurological:     General: No focal deficit present.     Mental Status: He is alert.    Review of Systems  Respiratory:  Negative for cough and shortness of breath.   Cardiovascular:  Negative for chest pain.  Gastrointestinal:  Negative for abdominal pain, constipation, diarrhea, nausea and vomiting.  Neurological:  Positive for dizziness (mild). Negative for tremors (last time 11/12) and headaches.   Blood pressure 132/81, pulse 100, temperature 98.7 F (37.1 C), temperature source Oral, resp. rate 20, SpO2 99 %. There is no height or weight on file to calculate BMI.  Treatment Plan Summary: Daily contact with patient to  assess and evaluate symptoms and progress in treatment and Medication management  LAVAUGHN HABERLE is a 56 y.o. male who presented to [facility] on 11/8 with initial complaints of SI/HI but reporting significant EtOH abuse, he was admitted to [facility] on 11/9 for Detox and Residential Placement.  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   Depression: Melancholic type, was  non-adherent to home celexa, which was restarted on admission. Tolerating rx well. Sleep and appetite are markedly improved as well as motivation. However mood was still "gray". No SI.  -Continue Celexa 10 mg daily -Continue Remeron 15 mg QHS  AUD  Action stage. Alcohol detox withdrawal with CIWA per protocol was initiated, no complicated withdrawals.  Continues to tolerate gabapentin for alcohol craving.  Patient is amenable and would benefit from naltrexone, however this was initially held due to elevated AST/ALT that were not down trending.  On repeat hepatic function test, AST/ALT are now down trending, however still 2-3x ULN, recommend starting naltrexone with outpatient provider if AST/ALT continues to downtrend.  Alk phos has normalized.  Also showed that total bili and indirect were low.  Hepatitis panel was negative. High risk screen for HIV and RPR are both nonreactive Recommend that patient discuss naltrexone with outpatient provider Continue Gabapentin 300 mg TID -Discontinued CIWA because scores = 0 x48hrs -Discontinue Ativan 1 mg q6 PRN CIWA>10 -Continue Thiamine 100 mg daily for nutritional supplementation -Continue Multivitamin daily for nutritional supplementation  Iron deficiency anemia 2/2 poor nutrition in the setting of housing instability.  Holding off on supplements given patient is now eating more and has improved appetite Continue supplement per above Recommend f/u with outpatient provider   Dispo: Location:  [facility] for 48-day residential program Tentative date: 11/16 Transport: Medicaid cab versus safe transport  Recommendations for follow-up: Establishing with PCP PCP to referral to GI as follow-up Abnormal labs for f/u: IDA, Elevated AST/ALT    Merrily Brittle, DO 02/02/2022 11:49 AM

## 2022-02-03 DIAGNOSIS — F332 Major depressive disorder, recurrent severe without psychotic features: Secondary | ICD-10-CM | POA: Diagnosis not present

## 2022-02-03 DIAGNOSIS — K709 Alcoholic liver disease, unspecified: Secondary | ICD-10-CM | POA: Diagnosis not present

## 2022-02-03 DIAGNOSIS — D509 Iron deficiency anemia, unspecified: Secondary | ICD-10-CM | POA: Diagnosis not present

## 2022-02-03 DIAGNOSIS — F1010 Alcohol abuse, uncomplicated: Secondary | ICD-10-CM | POA: Diagnosis not present

## 2022-02-03 MED ORDER — ADULT MULTIVITAMIN W/MINERALS CH: 1.0000 | ORAL_TABLET | Freq: Every day | ORAL | 0 refills | Status: AC

## 2022-02-03 MED ORDER — CITALOPRAM HYDROBROMIDE 10 MG PO TABS: 10.0000 mg | ORAL_TABLET | Freq: Every day | ORAL | 0 refills | Status: AC

## 2022-02-03 MED ORDER — VITAMIN B-1 100 MG PO TABS: 100.0000 mg | ORAL_TABLET | Freq: Every day | ORAL | 0 refills | Status: AC

## 2022-02-03 MED ORDER — MIRTAZAPINE 15 MG PO TABS: 15.0000 mg | ORAL_TABLET | Freq: Every day | ORAL | 0 refills | Status: AC

## 2022-02-03 MED ORDER — GABAPENTIN 300 MG PO CAPS: 300.0000 mg | ORAL_CAPSULE | Freq: Three times a day (TID) | ORAL | 0 refills | Status: AC

## 2022-02-03 NOTE — ED Notes (Signed)
Pt sleeping in no acute distress. RR even and unlabored. Environment secured. Will continue to monitor for safety. 

## 2022-02-03 NOTE — ED Notes (Signed)
Pt on phone. Denies concerns at present. Informed pt to notify staff with any needs. Will continue to monitor for safety.

## 2022-02-03 NOTE — ED Provider Notes (Signed)
FBC/OBS ASAP Discharge Summary  Date and Time: 02/03/2022, 10:46 AM  Name: Barry Moore  Age: 56 y.o.  DOB: 22-Jul-1965  MRN:  [id number]   Discharge Diagnoses:  Final diagnoses:  Alcohol abuse  Homelessness  Alcoholic liver disease ([place])  Transaminitis  Microcytic anemia  Tobacco abuse  Major depressive disorder, recurrent severe without psychotic features ([place])    HPI:  Per H&P: " Barry Moore,  56 y.o male with a history of alcohol abuse, suicidal ideation presented to [facility] as a transfer from [place]. Per the transfer patient is looking detox.  According to patient he drinks a 40 oz bottle every day and he has been drinking for a long time now, according to patient he smokes 1 pack of cigarettes daily.  Per the patient he is currently homeless he is not currently taking any medications, according to patient he is not seeing a psychiatrist or therapist.   Copied notes from ED: Barry Moore is a 56 y.o. male.  With past medical history of alcohol abuse complicated by pancreatitis, GI bleed, thrombocytopenia, hypertension, homelessness who presents to the emergency department with HI/SI   Patient is agitated on exam.  He provides very limited history.  States that he "needs to commit himself."  He states that "everyone wants to take my money."  He states that he is "homicidal or suicidal, one of the 2."  He does not have a particular plan of how he would do this. When asked about his visit to the emergency department and discharged this morning.  He was seen by behavioral health and felt that inpatient criteria was not met.  Apparently he was intoxicated on 03/26/2021 and was eventually evaluated by behavioral health after he clinically sobered and that time was denying SI/HI/AVH.  He is currently intoxicated and endorses drinking heavily.   Face-to-face observation of patient, patient is alert and oriented x 4, speech is clear, maintaining eye contact.  Mood is depressed affect  congruent with mood.  Patient denies current SI, HI, AVH or paranoia at this time.  Patient endorsed alcohol consumption of 40 ounce daily.  Patient denies marijuana use illicit drug use.   Recommend inpatient observation with further evaluation for [facility]  Past Psychiatric History: Depression and EtOH Abuse complicated by pancreatitis, GI bleed, thrombocytopenia, hypertension, homelessness.  "  Subjective:  Patient stated that he will talk to his sister to help find a PCP to establish care and referrals to GI and psychiatry. Reported still having residual dizziness that continues to improve, otherwise had no other somatic concerns or complaints. He feels good about going back to [place] 11/16.  Denied anxiety, as he has been there before and knows what to expect.  Denied access to guns or weapons.  Safety plan with patient, stated that he would call his sister and his doctors if he were to have thoughts of SI/HI/AVH, paranoia.  Discussed with patient 71, 911 as well.  Discussed with patient importance of frequent mood checks and to not isolate.  EM:[id number] Thoughts: No (Contracted to safety) SI Passive Intent and/or Plan:  (Denied) EO:FHQRFXJOI Thoughts: No TGP:QDIYMEBRAXENMM: None (Denied AVH) Ideas of HWK:GSUP   Mood: still feeling (gray) Sleep:Good Appetite: Good  Review of Systems  Respiratory:  Negative for shortness of breath.   Cardiovascular:  Negative for chest pain.  Gastrointestinal:  Negative for nausea and vomiting.  Neurological:  Positive for dizziness. Negative for tremors and headaches.    Stay Summary:  Barry Moore is a  56 y.o. male who presented to [facility] on 11/8 with initial complaints of SI/HI but reporting significant EtOH abuse, he was admitted to [facility] on 11/9 for Detox and Residential Placement.  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    NEW medications   mirtazapine 15 MG tablet; Commonly known as: REMERON; Take 1 tablet (15  mg total) by mouth at bedtime.   CONTINUE home medications  citalopram 10 MG tablet; Commonly known as: CELEXA; Take 1 tablet (10 mg  total) by mouth daily.  gabapentin 300 MG capsule; Commonly known as: NEURONTIN; Take 1 capsule  (300 mg total) by mouth 3 (three) times daily.  multivitamin with minerals Tabs tablet; Take 1 tablet by mouth daily.  thiamine 100 MG tablet; Commonly known as: Vitamin B-1; Take 1 tablet  (100 mg total) by mouth daily.  Depression: Melancholic type, was non-adherent to home celexa, which was restarted on admission. Tolerating rx well, with side effects of mild dizziness that improved. Sleep and appetite are markedly improved as well as motivation. However mood was still "gray". No SI.  -Continue Celexa 10 mg daily -Continue Remeron 15 mg QHS   AUD  Action stage. Alcohol detox withdrawal with CIWA per protocol was initiated, no complicated withdrawals.  Continues to tolerate gabapentin for alcohol craving.  Patient is amenable and would benefit from naltrexone, however this was initially held due to elevated AST/ALT that were not down trending.  On repeat hepatic function test, AST/ALT are now down trending, however still 2-3x ULN, recommend starting naltrexone with outpatient provider if AST/ALT continues to downtrend.  Alk phos has normalized.  Also showed that total bili and indirect were low.  Hepatitis panel was negative. High risk screen for HIV and RPR are both nonreactive Recommend that patient discuss naltrexone with outpatient provider Continue Gabapentin 300 mg TID Continue Thiamine 100 mg daily for nutritional supplementation Continue Multivitamin daily for nutritional supplementation   Iron deficiency anemia 2/2 poor nutrition in the setting of housing instability.  Holding off on supplements given patient is now eating more and has improved appetite Continue supplement per  above Recommend f/u with outpatient provider   Dispo: Location: [place] [facility] [place] for 48-day residential program Tentative date: 11/16 Transport: Medicaid cab versus safe transport 60 day printed scripts    Recommendations for follow-up: Establishing with PCP PCP to referral to GI as follow-up Abnormal labs for f/u: IDA, Elevated AST/ALT    Clinical Course as of 02/03/22 1046  Tue Feb 02, 2022  0830 TIBC(!): 613 [JN]  0830 Saturation Ratios(!): 11 [JN]  0830 HIV Screen 4th Generation wRfx: Non Reactive [JN]  0831 AST(!): 118 [JN]  0831 ALT(!): 128 [JN]  0832 Alkaline Phosphatase: 116 [JN]  0832 Total Bilirubin(!): 0.2 [JN]  0832 Bilirubin, Direct: 0.1 [JN]  0832 Indirect Bilirubin(!): 0.1 [JN]  0832 Total Protein: 7.7 [JN]  0832 Albumin: 3.9 [JN]  0833 Hepatitis B Surface Ag: NON REACTIVE [JN]  0833 HCV Ab: NON REACTIVE [JN]  0833 Hep A Ab, IgM: NON REACTIVE [JN]  0833 Hep B Core Ab, IgM: NON REACTIVE [JN]  0833 RPR: NON REACTIVE [JN]    Clinical Course User Index [JN] Merrily Brittle, DO    While future psychiatric events cannot be accurately predicted, the patient does not currently require acute inpatient psychiatric care and does not currently meet [facility] involuntary commitment criteria.  Past Psychiatric History: Per H&P Past Medical History:  Past Medical  History:  Diagnosis Date   Alcoholic hepatitis    Depression    ETOH abuse    Gastric bleed 08/2018   Hypertension    Pancreatitis    Seizures ([place])    Suicidal behavior     Past Surgical History:  Procedure Laterality Date   BIOPSY  08/05/2018   Procedure: BIOPSY;  Surgeon: Rush Landmark Telford Nab., MD;  Location: [facility] ENDOSCOPY;  Service: Gastroenterology;;   BIOPSY  08/07/2018   Procedure: BIOPSY;  Surgeon: Thornton Park, MD;  Location: [facility] ENDOSCOPY;  Service: Gastroenterology;;   BIOPSY  01/02/2019   Procedure: BIOPSY;  Surgeon: Gatha Mayer, MD;  Location: [facility] ENDOSCOPY;  Service:  Endoscopy;;   COLONOSCOPY WITH PROPOFOL N/A 08/07/2018   Procedure: COLONOSCOPY WITH PROPOFOL;  Surgeon: Thornton Park, MD;  Location: [place];  Service: Gastroenterology;  Laterality: N/A;   ESOPHAGOGASTRODUODENOSCOPY N/A 01/02/2019   Procedure: ESOPHAGOGASTRODUODENOSCOPY (EGD);  Surgeon: Gatha Mayer, MD;  Location: [facility] ENDOS[facility];  Service: Endoscopy;  Laterality: N/A;   ESOPHAGOGASTRODUODENOSCOPY N/A 07/03/2019   Procedure: ESOPHAGOGASTRODUODENOSCOPY (EGD);  Surgeon: Wonda Horner, MD;  Location: [facility] ENDOSCOPY;  Service: Endoscopy;  Laterality: N/A;   ESOPHAGOGASTRODUODENOSCOPY N/A 06/26/2020   Procedure: ESOPHAGOGASTRODUODENOSCOPY (EGD);  Surgeon: Arta Silence, MD;  Location: [facility] ENDOSCOPY;  Service: Endoscopy;  Laterality: N/A;   ESOPHAGOGASTRODUODENOSCOPY (EGD) WITH PROPOFOL N/A 11/02/2016   Procedure: ESOPHAGOGASTRODUODENOSCOPY (EGD) WITH PROPOFOL;  Surgeon: Carol Ada, MD;  Location: [facility] ENDOSCOPY;  Service: Endoscopy;  Laterality: N/A;   ESOPHAGOGASTRODUODENOSCOPY (EGD) WITH PROPOFOL N/A 08/05/2018   Procedure: ESOPHAGOGASTRODUODENOSCOPY (EGD) WITH PROPOFOL;  Surgeon: Rush Landmark Telford Nab., MD;  Location: [facility];  Service: Gastroenterology;  Laterality: N/A;   ESOPHAGOGASTRODUODENOSCOPY (EGD) WITH PROPOFOL N/A 07/14/2019   Procedure: ESOPHAGOGASTRODUODENOSCOPY (EGD) WITH PROPOFOL;  Surgeon: Otis Brace, MD;  Location: [facility] ENDOSCOPY;  Service: Gastroenterology;  Laterality: N/A;   HERNIA REPAIR     LEG SURGERY     POLYPECTOMY  08/07/2018   Procedure: POLYPECTOMY;  Surgeon: Thornton Park, MD;  [facility] END[facility]COPY;  Service: Gastroenterology;;   Family History:  Family History  Problem Relation Age of Onset   Diabetes Mother    Alcoholism Mother    Emphysema Father    Lung cancer Father    Alcoholism Father    Family Psychiatric History: Per H&P Social History:  Social History   Substance and Sexual Activity  Alcohol Use Yes   Comment: 5+ BEERS  DAILY, Patient does not know how much he drank tonight     Social History   Substance and Sexual Activity  Drug Use Not Currently   Frequency: 3.0 times per week   Types: Cocaine    Social History   Socioeconomic History   Marital status: Divorced    Spouse name: Not on file   Number of children: Not on file   Years of education: Not on file   Highest education level: Not on file  Occupational History   Not on file  Tobacco Use   Smoking status: Every Day    Packs/day: 1.00    Types: Cigarettes   Smokeless tobacco: Never  Vaping Use   Vaping Use: Never used  Substance and Sexual Activity   Alcohol use: Yes    Comment: 5+ BEERS DAILY, Patient does not know how much he drank tonight   Drug use: Not Currently    Frequency: 3.0 times per week    Types: Cocaine   Sexual activity: Not Currently  Other Topics Concern   Not on file  Social  History Narrative   Not on file   Social Determinants of Health   Financial Resource Strain: Not on file  Food Insecurity: Not on file  Transportation Needs: Not on file  Physical Activity: Not on file  Stress: Not on file  Social Connections: Not on file   SDOH:  SDOH Screenings   Alcohol Screen: High Risk (07/16/2021)  Depression (PHQ2-9): High Risk (01/30/2022)  Tobacco Use: High Risk (01/26/2022)   Tobacco Cessation:  N/A, patient does not currently use tobacco products  Current Medications:  Current Facility-Administered Medications  Medication Dose Route Frequency Provider Last Rate Last Admin   acetaminophen (TYLENOL) tablet 650 mg  650 mg Oral Q6H PRN Evette Georges, NP       alum & mag hydroxide-simeth (MAALOX/MYLANTA) 200-200-20 MG/5ML suspension 30 mL  30 mL Oral Q4H PRN Evette Georges, NP       citalopram (CELEXA) tablet 10 mg  10 mg Oral Daily Evette Georges, NP   10 mg at 02/03/22 9379   gabapentin (NEURONTIN) capsule 300 mg  300 mg Oral TID Evette Georges, NP   300 mg at 02/03/22 0240   magnesium hydroxide (MILK OF  MAGNESIA) suspension 30 mL  30 mL Oral Daily PRN Evette Georges, NP       mirtazapine (REMERON) tablet 15 mg  15 mg Oral QHS Derrill Center, NP   15 mg at 02/02/22 2102   multivitamin with minerals tablet 1 tablet  1 tablet Oral Daily Evette Georges, NP   1 tablet at 02/03/22 9735   thiamine (VITAMIN B1) tablet 100 mg  100 mg Oral Daily Evette Georges, NP   100 mg at 02/03/22 0907   Current Outpatient Medications  Medication Sig Dispense Refill   citalopram (CELEXA) 10 MG tablet Take 1 tablet (10 mg total) by mouth daily. 60 tablet 0   gabapentin (NEURONTIN) 300 MG capsule Take 1 capsule (300 mg total) by mouth 3 (three) times daily. 180 capsule 0   mirtazapine (REMERON) 15 MG tablet Take 1 tablet (15 mg total) by mouth at bedtime. 60 tablet 0   Multiple Vitamin (MULTIVITAMIN WITH MINERALS) TABS tablet Take 1 tablet by mouth daily. 60 tablet 0   thiamine (VITAMIN B-1) 100 MG tablet Take 1 tablet (100 mg total) by mouth daily. 60 tablet 0    PTA Medications: (Not in a hospital admission)     01/30/2022    1:25 PM 01/28/2022    2:40 PM 01/21/2022    2:11 PM  Depression screen PHQ 2/9  Decreased Interest _0 Down, Depressed, Hopeless 3 3 0  PHQ - 2 Score _1 Altered sleeping 2 2 0  Tired, decreased energy 2 2 0  Change in appetite 0 2 0  Feeling bad or failure about yourself  1 3 0  Trouble concentrating 3 3 0  Moving slowly or fidgety/restless 1 2 0  Suicidal thoughts 0 3 0  PHQ-9 Score _2 Difficult doing work/chores Somewhat difficult Very difficult Somewhat difficult    Flowsheet Row ED from 01/27/2022 in [facility] ED from 01/26/2022 in [place] DEPT ED from 01/24/2022 in [place] DEPT  C-SSRS RISK CATEGORY Error: Q3, 4, or 5 should not be populated when Q2 is No High Risk High Risk       Musculoskeletal  Strength & Muscle Tone: within normal limits Gait & Station:  normal Patient leans: N/A   Psychiatric  Specialty Exam   Presentation  General Appearance:Casual, Disheveled Eye Contact:Good Speech:Clear and Coherent, Normal Rate Volume:Normal Handedness:Right  Mood and Affect  Mood: (gray) Affect:Appropriate, Congruent, Full Range  Thought Process  Thought Process:Coherent, Goal Directed, Linear Descriptions of Associations:Intact  Thought Content Suicidal Thoughts:Suicidal Thoughts: No (Contracted to safety) SI Passive Intent and/or Plan:  (Denied) Homicidal Thoughts:Homicidal Thoughts: No Hallucinations:Hallucinations: None (Denied AVH) Ideas of Reference:None Thought Content:WDL  Sensorium  Memory:Immediate Fair Judgment:Fair Insight:Fair  Executive Functions  Orientation:Full (Time, Place and Person) Language:Good Concentration:Good [place] of Knowledge:Good  Psychomotor Activity  Psychomotor Activity:Psychomotor Activity: Normal  Assets  Assets:Communication Skills, Desire for Improvement, Social Support  Sleep  Quality:Good  Physical Exam  BP 130/60 (BP Location: Right Arm)   Pulse 98   Temp 98.1 F (36.7 C) (Tympanic)   Resp 18   SpO2 100%   Physical Exam Vitals and nursing note reviewed.  Constitutional:      General: He is not in acute distress.    Appearance: He is not ill-appearing, toxic-appearing or diaphoretic.  HENT:     Head: Normocephalic.  Pulmonary:     Effort: Pulmonary effort is normal. No respiratory distress.  Neurological:     Mental Status: He is alert.     Demographic Factors:  Caucasian, Low socioeconomic status, Living alone, and Unemployed  Loss Factors: Loss of significant relationship and Financial problems/change in socioeconomic status  Historical Factors: Family history of mental illness or substance abuse and Impulsivity  Risk Reduction Factors:   Sense of responsibility to family, Positive social support, and Positive therapeutic  relationship  Continued Clinical Symptoms:  Depression:   Comorbid alcohol abuse/dependence Alcohol/Substance Abuse/Dependencies Previous Psychiatric Diagnoses and Treatments  Cognitive Features That Contribute To Risk:  Loss of executive function    Suicide Risk:  Mild:  Suicidal ideation of limited frequency, intensity, duration, and specificity.  There are no identifiable plans, no associated intent, mild dysphoria and related symptoms, good self-control (both objective and subjective assessment), few other risk factors, and identifiable protective factors, including available and accessible social support.  Plan Of Care/Follow-up recommendations:  Activity and diet at tolerated.  Please: Take all medications as prescribed by your mental healthcare provider. Report any adverse effects and or reactions from the medicines to your outpatient provider promptly. Do not engage in alcohol and or illegal drug use while on prescription medicines.  Disposition:  Location: [place] [facility] [place] for 48-day residential program Tentative date: 11/16 Transport: Medicaid cab versus safe transport 60 day printed scripts + 14 day samples provided   Total Time spent with patient: 20 minutes  Signed: Merrily Brittle, DO Psychiatry Resident, PGY-2 [facility] 02/03/2022, 10:46 AM

## 2022-02-03 NOTE — ED Notes (Signed)
Pt asleep in bed. Respirations even and unlabored. Monitoring for safety. 

## 2022-02-03 NOTE — ED Notes (Signed)
Pt awake- A/O x4- in dayroom watching tv. Denies SI/HI/AVH. Denies s/s of withdrawal. Pleasant.calm and engaged states he is looking forward to dc to [facility]. No note resp distress. Will continue to monitor for safety

## 2022-02-03 NOTE — ED Notes (Signed)
Pt is in the bed sleeping. Respirations are even and unlabored. No acute distress noted. Will continue to monitor for safety. 

## 2022-02-03 NOTE — ED Notes (Signed)
Patient A&Ox4. Denies intent to harm self/others when asked. Denies A/VH. Patient denies any physical complaints when asked. No acute distress noted. Pt states, "I'm ready to go to the rehab place in [place]. I'm ready to start a new chapter in my life. Support and encouragement provided. Routine safety checks conducted according to facility protocol. Encouraged patient to notify staff if thoughts of harm toward self or others arise. Patient verbalize understanding and agreement. Will continue to monitor for safety.

## 2022-02-04 DIAGNOSIS — K709 Alcoholic liver disease, unspecified: Secondary | ICD-10-CM | POA: Diagnosis not present

## 2022-02-04 DIAGNOSIS — F332 Major depressive disorder, recurrent severe without psychotic features: Secondary | ICD-10-CM | POA: Diagnosis not present

## 2022-02-04 DIAGNOSIS — F1010 Alcohol abuse, uncomplicated: Secondary | ICD-10-CM | POA: Diagnosis not present

## 2022-02-04 DIAGNOSIS — D509 Iron deficiency anemia, unspecified: Secondary | ICD-10-CM | POA: Diagnosis not present

## 2022-02-04 NOTE — ED Notes (Signed)
Pt asleep in bed. Respirations even and unlabored. Monitoring for safety. 

## 2022-02-04 NOTE — Progress Notes (Signed)
Barry Moore was discharged via [facility] [facility]. He received his personal belongings, AVS and prescriptions.

## 2022-10-31 ENCOUNTER — Emergency Department (HOSPITAL_COMMUNITY)
Admission: EM | Admit: 2022-10-31 | Discharge: 2022-11-01 | Disposition: A | Discharge status: Home / Self Care | Payer: Medicaid Other | Attending: Emergency Medicine | Admitting: Emergency Medicine

## 2022-10-31 DIAGNOSIS — F1094 Alcohol use, unspecified with alcohol-induced mood disorder: Secondary | ICD-10-CM

## 2022-10-31 DIAGNOSIS — F1020 Alcohol dependence, uncomplicated: Secondary | ICD-10-CM

## 2022-10-31 DIAGNOSIS — Z59819 Housing instability, housed unspecified: Secondary | ICD-10-CM

## 2022-10-31 DIAGNOSIS — F10220 Alcohol dependence with intoxication, uncomplicated: Secondary | ICD-10-CM | POA: Diagnosis present

## 2022-10-31 DIAGNOSIS — R45851 Suicidal ideations: Secondary | ICD-10-CM | POA: Insufficient documentation

## 2022-10-31 DIAGNOSIS — F10920 Alcohol use, unspecified with intoxication, uncomplicated: Secondary | ICD-10-CM

## 2022-11-01 ENCOUNTER — Other Ambulatory Visit: Payer: Self-pay

## 2022-11-01 ENCOUNTER — Encounter (HOSPITAL_COMMUNITY): Payer: Self-pay

## 2022-11-01 ENCOUNTER — Emergency Department (HOSPITAL_COMMUNITY): Payer: Medicaid Other

## 2022-11-01 LAB — RAPID URINE DRUG SCREEN, HOSP PERFORMED
Amphetamines: NOT DETECTED
Barbiturates: NOT DETECTED
Benzodiazepines: POSITIVE — AB
Cocaine: POSITIVE — AB
Opiates: NOT DETECTED
Tetrahydrocannabinol: NOT DETECTED

## 2022-11-01 LAB — COMPREHENSIVE METABOLIC PANEL
ALT: 21 U/L (ref 0–44)
AST: 35 U/L (ref 15–41)
Albumin: 3.9 g/dL (ref 3.5–5.0)
Alkaline Phosphatase: 102 U/L (ref 38–126)
Anion gap: 14 (ref 5–15)
BUN: 7 mg/dL (ref 6–20)
CO2: 21 mmol/L — ABNORMAL LOW (ref 22–32)
Calcium: 8.9 mg/dL (ref 8.9–10.3)
Chloride: 106 mmol/L (ref 98–111)
Creatinine, Ser: 0.98 mg/dL (ref 0.61–1.24)
GFR, Estimated: >60 mL/min (ref >60)
Glucose, Bld: 89 mg/dL (ref 70–99)
Potassium: 4 mmol/L (ref 3.5–5.1)
Sodium: 141 mmol/L (ref 135–145)
Total Bilirubin: 0.3 mg/dL (ref 0.3–1.2)
Total Protein: 7.6 g/dL (ref 6.5–8.1)

## 2022-11-01 LAB — CBC
HCT: 39.4 % (ref 39.0–52.0)
Hemoglobin: 12.4 g/dL — ABNORMAL LOW (ref 13.0–17.0)
MCH: 26.8 pg (ref 26.0–34.0)
MCHC: 31.5 g/dL (ref 30.0–36.0)
MCV: 85.1 fL (ref 80.0–100.0)
Platelets: 234 10*3/uL (ref 150–400)
RBC: 4.63 MIL/uL (ref 4.22–5.81)
RDW: 17.6 % — ABNORMAL HIGH (ref 11.5–15.5)
WBC: 7.7 10*3/uL (ref 4.0–10.5)
nRBC: 0 % (ref 0.0–0.2)

## 2022-11-01 LAB — SALICYLATE LEVEL: Salicylate Lvl: <7 mg/dL — ABNORMAL LOW (ref 7.0–30.0)

## 2022-11-01 LAB — ACETAMINOPHEN LEVEL: Acetaminophen (Tylenol), Serum: <10 ug/mL — ABNORMAL LOW (ref 10–30)

## 2022-11-01 LAB — ETHANOL: Alcohol, Ethyl (B): 312 mg/dL (ref <10)

## 2022-11-01 LAB — TROPONIN I (HIGH SENSITIVITY): Troponin I (High Sensitivity): 4 ng/L (ref <18)

## 2022-11-01 LAB — CBG MONITORING, ED: Glucose-Capillary: 97 mg/dL (ref 70–99)

## 2022-11-01 MED ORDER — LORAZEPAM 1 MG PO TABS: 1.0000 mg | ORAL_TABLET | ORAL | Status: DC | PRN

## 2022-11-01 MED ORDER — THIAMINE MONONITRATE 100 MG PO TABS
100.0000 mg | ORAL_TABLET | Freq: Every day | ORAL | Status: DC
  Administered 2022-11-01: 100 mg via ORAL
  Filled 2022-11-01: qty 1

## 2022-11-01 MED ORDER — CHLORDIAZEPOXIDE HCL 25 MG PO CAPS: ORAL_CAPSULE | ORAL | 0 refills | Status: AC

## 2022-11-01 MED ORDER — THIAMINE HCL 100 MG/ML IJ SOLN: 100.0000 mg | Freq: Every day | INTRAMUSCULAR | Status: DC

## 2022-11-01 MED ORDER — LORAZEPAM 2 MG/ML IJ SOLN: 0.0000 mg | Freq: Two times a day (BID) | INTRAMUSCULAR | Status: DC

## 2022-11-01 MED ORDER — LORAZEPAM 2 MG/ML IJ SOLN: 0.0000 mg | Freq: Four times a day (QID) | INTRAMUSCULAR | Status: DC

## 2022-11-01 MED ORDER — MIRTAZAPINE 15 MG PO TABS: 15.0000 mg | ORAL_TABLET | Freq: Every day | ORAL | Status: DC

## 2022-11-01 MED ORDER — ADULT MULTIVITAMIN W/MINERALS CH
1.0000 | ORAL_TABLET | Freq: Every day | ORAL | Status: DC
  Administered 2022-11-01: 1 via ORAL
  Filled 2022-11-01: qty 1

## 2022-11-01 MED ORDER — CITALOPRAM HYDROBROMIDE 10 MG PO TABS
10.0000 mg | ORAL_TABLET | Freq: Every day | ORAL | Status: DC
  Administered 2022-11-01: 10 mg via ORAL
  Filled 2022-11-01: qty 1

## 2022-11-01 MED ORDER — LORAZEPAM 2 MG/ML IJ SOLN: 1.0000 mg | INTRAMUSCULAR | Status: DC | PRN

## 2022-11-01 MED ORDER — FOLIC ACID 1 MG PO TABS
1.0000 mg | ORAL_TABLET | Freq: Every day | ORAL | Status: DC
  Administered 2022-11-01: 1 mg via ORAL
  Filled 2022-11-01: qty 1

## 2022-11-01 MED ORDER — GABAPENTIN 300 MG PO CAPS
300.0000 mg | ORAL_CAPSULE | Freq: Three times a day (TID) | ORAL | Status: DC
  Administered 2022-11-01: 300 mg via ORAL
  Filled 2022-11-01: qty 1
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
## 2022-11-01 NOTE — ED Notes (Signed)
Patient given clothes to get dressed.

## 2022-11-01 NOTE — Discharge Instructions (Addendum)
It was our pleasure to provide your ER care today - we hope that you feel better.  Avoid alcohol use as it is harmful to your physical health and mental well-being.  You may take librium as need, as prescribed to help if alcohol withdrawal symptoms. See resource guide attached in terms of accessing inpatient or outpatient alcohol use treatment programs as well as other behavioral health resources in the area.   Follow up closely with primary care doctor and behavioral health provider in the coming week.  For mental health issues and/or crisis, you may also go directly to the Behavioral Health Urgent Care Center - they are open 24/7 and walk-ins are welcome.    Return to ER if worse, new symptoms, fevers, chest pain, trouble breathing, or other emergency concern.
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
## 2022-11-01 NOTE — ED Triage Notes (Signed)
Patient arrived by PD with SI complaint. Patient reports sister is dying of cancer.  When asked patient about plan patient stated "I don't care, I just got to figure it out. I will fight anyone, I just want to die. She is all I have."  +ETOH

## 2023-11-01 DIAGNOSIS — S0181XA Laceration without foreign body of other part of head, initial encounter: Principal | ICD-10-CM
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
## 2023-11-02 ENCOUNTER — Inpatient Hospital Stay
Admit: 2023-11-02 | Discharge: 2023-11-02 | Disposition: A | Discharge status: Home / Self Care | Payer: Medicaid (Managed Care) | Arrived: AM | Attending: Emergency Medicine

## 2023-11-02 ENCOUNTER — Emergency Department: Admit: 2023-11-02 | Payer: Medicaid (Managed Care)

## 2023-11-02 MED ORDER — BACITRACIN 500 UNIT/GM EX OINT
500 | CUTANEOUS | Status: AC
Start: 2023-11-02 — End: 2023-11-02
  Administered 2023-11-02: 05:00:00 via TOPICAL

## 2023-11-02 MED ORDER — IBUPROFEN 600 MG PO TABS
600 | ORAL_TABLET | Freq: Four times a day (QID) | ORAL | 0 refills | 15.00000 days | Status: DC | PRN
Start: 2023-11-02 — End: 2023-12-22

## 2023-11-02 MED ORDER — BACITRACIN 500 UNIT/GM EX OINT
500 | Freq: Three times a day (TID) | CUTANEOUS | 0 refills | 7.00000 days | Status: AC
Start: 2023-11-02 — End: 2023-11-09

## 2023-11-02 MED ORDER — IBUPROFEN 600 MG PO TABS
600 | ORAL | Status: AC
Start: 2023-11-02 — End: 2023-11-02
  Administered 2023-11-02: 05:00:00 600 mg via ORAL

## 2023-11-02 NOTE — ED Notes (Signed)
 Pt to CT with tech.

## 2023-12-17 ENCOUNTER — Inpatient Hospital Stay
Admit: 2023-12-17 | Discharge: 2023-12-18 | Disposition: A | Discharge status: Still In Hospital | Payer: Medicaid (Managed Care) | Arrived: AM | Attending: Student in an Organized Health Care Education/Training Program

## 2023-12-17 ENCOUNTER — Emergency Department: Admit: 2023-12-18 | Payer: Medicaid (Managed Care)

## 2023-12-17 DIAGNOSIS — F1013 Alcohol abuse with withdrawal, uncomplicated (HCC): Secondary | ICD-10-CM

## 2023-12-17 DIAGNOSIS — R45851 Suicidal ideations: Principal | ICD-10-CM

## 2023-12-17 LAB — CBC WITH AUTO DIFFERENTIAL
Basophils %: 0 % (ref 0–1)
Basophils Absolute: 0 K/UL (ref 0.0–0.1)
Eosinophils %: 2 % (ref 0–7)
Eosinophils Absolute: 0.19 K/UL (ref 0.0–0.4)
Hematocrit: 40.8 % (ref 36.6–50.3)
Hemoglobin: 14.4 g/dL (ref 12.1–17.0)
Immature Granulocytes %: 0 %
Immature Granulocytes Absolute: 0 K/UL
Lymphocytes %: 59 % — ABNORMAL HIGH (ref 12–49)
Lymphocytes Absolute: 5.73 K/UL — ABNORMAL HIGH (ref 0.8–3.5)
MCH: 33.3 pg (ref 26.0–34.0)
MCHC: 35.3 g/dL (ref 30.0–36.5)
MCV: 94.4 FL (ref 80.0–99.0)
MPV: 9.7 FL (ref 8.9–12.9)
Monocytes %: 3 % — ABNORMAL LOW (ref 5–13)
Monocytes Absolute: 0.29 K/UL (ref 0.0–1.0)
Neutrophils %: 36 % (ref 32–75)
Neutrophils Absolute: 3.49 K/UL (ref 1.8–8.0)
Nucleated RBCs: 0 /100{WBCs}
Platelets: 293 K/uL (ref 150–400)
RBC: 4.32 M/uL (ref 4.10–5.70)
RDW: 14.4 % (ref 11.5–14.5)
WBC Comment: REACTIVE
WBC: 9.7 K/uL (ref 4.1–11.1)
nRBC: 0 K/uL (ref 0.00–0.01)

## 2023-12-17 LAB — COMPREHENSIVE METABOLIC PANEL
ALT: 81 U/L — ABNORMAL HIGH (ref 10–50)
AST: 55 U/L — ABNORMAL HIGH (ref 10–50)
Albumin/Globulin Ratio: 1.4 (ref 1.1–2.2)
Albumin: 4.5 g/dL (ref 3.5–5.2)
Alk Phosphatase: 90 U/L (ref 40–129)
Anion Gap: 16 mmol/L — ABNORMAL HIGH (ref 2–12)
BUN/Creatinine Ratio: 11 — ABNORMAL LOW (ref 12–20)
BUN: 8 mg/dL (ref 6–20)
CO2: 25 mmol/L (ref 22–29)
Calcium: 8.5 mg/dL — ABNORMAL LOW (ref 8.6–10.0)
Chloride: 99 mmol/L (ref 98–107)
Creatinine: 0.7 mg/dL (ref 0.70–1.20)
Est, Glom Filt Rate: >90 ml/min/1.73m2 (ref >60)
Globulin: 3.2 g/dL (ref 2.0–4.0)
Glucose: 135 mg/dL — ABNORMAL HIGH (ref 65–100)
Potassium: 3.9 mmol/L (ref 3.5–5.1)
Sodium: 140 mmol/L (ref 136–145)
Total Bilirubin: 0.3 mg/dL (ref 0.0–1.2)
Total Protein: 7.7 g/dL (ref 6.4–8.3)

## 2023-12-17 LAB — URINALYSIS WITH MICROSCOPIC
BACTERIA, URINE: NEGATIVE /HPF
Bilirubin, Urine: NEGATIVE
Blood, Urine: NEGATIVE
Glucose, Ur: NEGATIVE mg/dL
Ketones, Urine: NEGATIVE mg/dL
Leukocyte Esterase, Urine: NEGATIVE
Nitrite, Urine: NEGATIVE
Protein, UA: NEGATIVE mg/dL
Specific Gravity, UA: <1.005 (ref 1.003–1.030)
Urobilinogen, Urine: 0.2 EU/dL (ref 0.2–1.0)
pH, Urine: 6 (ref 5.0–8.0)

## 2023-12-17 LAB — ETHANOL: Ethanol Lvl: 330 mg/dL — ABNORMAL HIGH (ref <10)

## 2023-12-17 LAB — URINE DRUG SCREEN
Amphetamine, Urine: NEGATIVE
Barbiturates, Urine: NEGATIVE
Benzodiazepines, Urine: NEGATIVE
Cocaine, Urine: NEGATIVE
Methadone, Urine: NEGATIVE
Opiates, Urine: NEGATIVE
Phencyclidine, Urine: NEGATIVE
THC, TH-Cannabinol, Urine: NEGATIVE

## 2023-12-17 LAB — URINE CULTURE HOLD SAMPLE

## 2023-12-17 LAB — ACETAMINOPHEN LEVEL: Acetaminophen Level: <5 ug/mL — ABNORMAL LOW (ref 10–30)

## 2023-12-17 LAB — MAGNESIUM: Magnesium: 2.4 mg/dL (ref 1.6–2.6)

## 2023-12-17 MED ORDER — SODIUM CHLORIDE (PF) 0.9 % IJ SOLN
0.9 | Freq: Once | INTRAMUSCULAR | Status: AC
Start: 2023-12-17 — End: 2023-12-17
  Administered 2023-12-17: 22:00:00 20 mg via INTRAVENOUS

## 2023-12-17 MED ORDER — ONDANSETRON 4 MG PO TBDP
4 | ORAL | Status: AC
Start: 2023-12-17 — End: 2023-12-17
  Administered 2023-12-17: 22:00:00 4 mg via ORAL

## 2023-12-17 MED ORDER — DIPHENHYDRAMINE HCL 50 MG/ML IJ SOLN
50 | Freq: Once | INTRAMUSCULAR | Status: AC
Start: 2023-12-17 — End: 2023-12-17
  Administered 2023-12-17: 22:00:00 25 mg via INTRAVENOUS

## 2023-12-17 MED ORDER — METOCLOPRAMIDE HCL 5 MG/ML IJ SOLN
5 | Freq: Once | INTRAMUSCULAR | Status: AC
Start: 2023-12-17 — End: 2023-12-17
  Administered 2023-12-17: 22:00:00 5 mg via INTRAVENOUS

## 2023-12-17 MED ORDER — LACTATED RINGERS IV BOLUS
INTRAVENOUS | Status: AC
Start: 2023-12-17 — End: 2023-12-17
  Administered 2023-12-17: 22:00:00 1000 mL via INTRAVENOUS

## 2023-12-17 MED FILL — METOCLOPRAMIDE HCL 5 MG/ML IJ SOLN: 5 mg/mL | INTRAMUSCULAR | Qty: 2 | Fill #0

## 2023-12-17 MED FILL — DIPHENHYDRAMINE HCL 50 MG/ML IJ SOLN: 50 mg/mL | INTRAMUSCULAR | Qty: 1 | Fill #0

## 2023-12-17 MED FILL — ONDANSETRON 4 MG PO TBDP: 4 mg | ORAL | Qty: 1 | Fill #0

## 2023-12-17 MED FILL — LACTATED RINGERS IV SOLN: INTRAVENOUS | Qty: 1000 | Fill #0

## 2023-12-17 MED FILL — FAMOTIDINE (PF) 20 MG/2ML IV SOLN: 20 MG/2ML | INTRAVENOUS | Qty: 2 | Fill #0
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
## 2023-12-17 NOTE — ED Notes (Signed)
 MD at bedside for assessment

## 2023-12-17 NOTE — ED Notes (Signed)
 Patient transferred from [facility] ED for SI. Awaiting behavioral placement    Pt drinks etoh, reports a case and a half per day.   On exam he is in no acute distress, not diaphoretic, mild tremor. Normal VS. States he feels he is in withdrawals, denies hx of seizure. Placed on CIWA protocol.     11:24 AM patient has bed at [facility] EMTALA filled out. CIWAs are down-trending on oral ativan.    Arora Coakley E Terilynn Buresh, DO       Dallas Scorsone E, DO  12/18/23 1124

## 2023-12-17 NOTE — Video Visit Notes (Signed)
 Bradley [name], was evaluated through a synchronous (real-time) audio-video encounter. The patient (and/or guardian if applicable) is aware that this is a billable service, which includes applicable co-pays. This virtual visit was conducted with patient's (and/or legal guardian's) consent. Patient identification was verified, and a caregiver was present when appropriate.  The patient was located at Facility (Appt Department): [facility] EMERGENCY DEPARTMENT  [address]  [place] [address]  Loc: [phone]  The provider was located at Home (City/State): [place]   Confirm you are appropriately licensed, registered, or certified to deliver care in the state where the patient is located as indicated above. If you are not or unsure, please re-schedule the visit: Yes, I confirm.   Consults       Attempted to evaluate patient.  Patient had difficulty remembering where he was and what year it is.  Additionally, patient was mispronouncing words and reported dizziness.  Patient's BAL was 330 mg/dL at 8343.  Patient is still impaired and an appropriate psychiatric evaluation can not be performed at this time.  Recommend rechecking BAL at midnight or later.  Patient's evaluation will be delayed at this time and the TelePsych team will check back later to see if patient is appropriate for an psychiatric evaluation.  Dr. King notified.      --Norleen Donald, APRN - CNP on 12/17/2023 at 8:32 PM    An electronic signature was used to authenticate this note.

## 2023-12-17 NOTE — ED Notes (Signed)
 Patient indicates he is having a harder time than usual d/t the recent death of his older sister. He indicated that he lost his mother in 2018 and was unable to be there for the spreading of her ashes d/t incarceration for protecting her. Now he feels alone with no family. [place]

## 2023-12-17 NOTE — ED Notes (Addendum)
 Tele-psych at bedside.

## 2023-12-17 NOTE — ED Provider Notes (Signed)
 ED SIGN OUT NOTE  Care assumed at [facility]. [facility] 1:26 AM EDT    Patient was signed out to me by Dr. King.     Patient is awaiting Telepsych assessment.    BP 120/76   Pulse 88   Temp 98.4 F (36.9 C) (Oral)   Resp 16   Ht 1.727 m (5' 8)   Wt 77.1 kg (170 lb)   SpO2 96%   BMI 25.85 kg/m     Labs Reviewed   CBC WITH AUTO DIFFERENTIAL - Abnormal; Notable for the following components:       Result Value    Lymphocytes % 59 (*)     Monocytes % 3 (*)     Lymphocytes Absolute 5.73 (*)     All other components within normal limits   COMPREHENSIVE METABOLIC PANEL - Abnormal; Notable for the following components:    Anion Gap 16 (*)     Glucose 135 (*)     BUN/Creatinine Ratio 11 (*)     Calcium 8.5 (*)     ALT 81 (*)     AST 55 (*)     All other components within normal limits   ACETAMINOPHEN LEVEL - Abnormal; Notable for the following components:    Acetaminophen Level <5 (*)     All other components within normal limits   ETHANOL - Abnormal; Notable for the following components:    Ethanol Lvl 330 (*)     All other components within normal limits   ETHANOL - Abnormal; Notable for the following components:    Ethanol Lvl 184 (*)     All other components within normal limits   URINE CULTURE HOLD SAMPLE   URINALYSIS WITH MICROSCOPIC   URINE DRUG SCREEN   MAGNESIUM     CT HEAD WO CONTRAST   Final Result   No acute process or change compared the prior exam.            Electronically signed by DARICE COLON          ED Course as of 12/18/23 0126   Sat Dec 17, 2023   2113 Patient apparently mispronounced words and was confused at where he was per psych. Per nurse he stated I'm not able to quantify how much I drink with appropriate diction and was merely confused about which hospital he was taken to as he was brought in by EMS and was asleep when the psych person tried to talk to him. Psych now refusing to speak to him despite clinical sobriety. Will repeat ETOH level, though again he is a known  alcoholic and likely always a bit elevated ETOH, and will add HCT given psych concerns for altered.  [MG]      ED Course User Index  [MG] Guzzardo, Marissa, DO       Diagnosis:   No diagnosis found.    Disposition:        Plan:   Patient has been seen and evaluated by telepsych and do safety plan for discharge is agreeable to voluntary admission.  Patient has been medically cleared for inpatient psychiatric admission.    Celestine LELON Piety, MD        Piety Celestine LELON, MD  12/18/23 [phone]

## 2023-12-17 NOTE — ED Provider Notes (Signed)
 [facility] EMERGENCY DEPARTMENT  EMERGENCY DEPARTMENT ENCOUNTER      Pt Name: Bradley [name]  MRN: [id number]  Birthdate [birth year]  Date of evaluation: 12/17/2023  Provider: Daved Pinna, DO    CHIEF COMPLAINT       Chief Complaint   Patient presents with    Suicidal    Alcohol Intoxication       PMH   Past Medical History:   Diagnosis Date    Abuse, drug or alcohol (HCC)     alcohol abuse         MDM:   Vitals:    Vitals:    12/17/23 2200   BP: 120/76   Pulse: 88   Resp: 16   Temp: 98.4 F (36.9 C)   SpO2: 96%           This is a 58 y.o. male with pmhx homelessness, mental health problems, alcohol abuse, polysubstance abuse who presents today for cc of intoxication . Patient is homeless, drinks 2-4 40s a day, no hx withdrawal seizures. Did more crack cocaine today than usual and felt bad. Told EMS he was homicidal against a former lover and suicidal but no plan. To me he states I don't feel that way anymore.  . Otherwise denies any medical complaints.     On arrival VS stable.     Physical Exam  General: Alert, no acute distress  HEENT: Normocephalic, atraumatic. EOMI, moist oral mucosa, no conjunctival injection  Neck: ROM normal, supple  Cardio: Heart regular rate and regular rhythm, cap refill <2seconds  Lungs: no respiratory distress, lungs CTAB, no wheezes, rhonchi or rales   Abdomen: abdomen soft, nontender  MSK:ROM normal, no LE edema  Skin: Warm, dry, no rash  Neuro: No focal neurodeficits, AOx3     Patient evaluated for several hours, ETOH 330, suspect chronically elevated. Clinically sober for psych eval.     Attempted psych eval and NP stated he felt the patient mispronounced words and was confused. Upon further clarification the patient was woken from deep sleep and did not immediately provide the answer for what hospital he was at. Doubt central etiology, did get CT out of abundance of caution, but notably on eval once awake patient was able to pronounce quantify and discuss things  appropriately as he was prior to this episode and is clinically sober. Psych NP wants ETOH <200 proven so we will redraw prior to his eval.     Immediately redrew ETOH which is less than 200. Attempting to get psych to re-eval x 2hr.     Patient awaiting psychiatric re-eval now that he is sober. Stable at time of signout.             ED Course as of 12/17/23 2307   Sat Dec 17, 2023   2113 Patient apparently mispronounced words and was confused at where he was per psych. Per nurse he stated I'm not able to quantify how much I drink with appropriate diction and was merely confused about which hospital he was taken to as he was brought in by EMS and was asleep when the psych person tried to talk to him. Psych now refusing to speak to him despite clinical sobriety. Will repeat ETOH level, though again he is a known alcoholic and likely always a bit elevated ETOH, and will add HCT given psych concerns for altered.  [MG]      ED Course User Index  [MG] Kemora Pinard, DO  Total critical care time (not including time spent performing separately reportable procedures):         Review of external notes and Independent historians utilized in decision making: External notes reviewed includeOutside medication list     Diagnostics independently interpreted by me: None    Discussions with other clinicians and healthcare agents:  Psychiatry service with plan for evaluation  and Oncoming physician for handoff of treatment plan and workup    Risks considered in patient's treatment plan: IV medications given (see MAR)        HISTORY OF PRESENT ILLNESS   (Location/Symptom, Timing/Onset, Context/Setting, Quality, Duration, Modifying Factors, Severity)  Note limiting factors.   See MDM    Nursing Notes were reviewed.    REVIEW OF SYSTEMS    (2-9 systems for level 4, 10 or more for level 5)   See MDM    PAST MEDICAL HISTORY     Past Medical History:   Diagnosis Date    Abuse, drug or alcohol (HCC)     alcohol abuse        SURGICAL HISTORY     History reviewed. No pertinent surgical history.    CURRENT MEDICATIONS       Previous Medications    IBUPROFEN  (ADVIL ;MOTRIN ) 600 MG TABLET    Take 1 tablet by mouth every 6 hours as needed for Pain or Fever       ALLERGIES     Patient has no known allergies.    FAMILY HISTORY     History reviewed. No pertinent family history.       SOCIAL HISTORY       Social History     Socioeconomic History    Marital status: Widowed     Spouse name: None    Number of children: None    Years of education: None    Highest education level: None   Tobacco Use    Smoking status: Unknown   Substance and Sexual Activity    Alcohol use: Yes    Drug use: Not Currently     Social Drivers of Health     Financial Resource Strain: High Risk (11/23/2022)    Received from [facility]    Overall Financial Resource Strain (CARDIA)     Difficulty of Paying Living Expenses: Very hard   Food Insecurity: Food Insecurity Present (07/21/2023)    Received from [facility]    Hunger Vital Sign     Within the past 12 months, you worried that your food would run out before you got the money to buy more.: Often true     Within the past 12 months, the food you bought just didn't last and you didn't have money to get more.: Often true   Transportation Needs: At Risk (07/21/2023)    Received from [facility] - Transportation     Lack of Transportation: Yes   Physical Activity: Inactive (11/23/2022)    Received from [facility]    Exercise Vital Sign     On average, how many days per week do you engage in moderate to strenuous exercise (like a brisk walk)?: 0 days     On average, how many minutes do you engage in exercise at this level?: 0 min   Stress: Stress Concern Present (11/23/2022)    Received from [facility]    [facility] of Occupational [facility] - Occupational Stress Questionnaire  Feeling of Stress : Very much   Social Connections:  Socially Isolated (11/23/2022)    Received from [facility]    Social Connection and Isolation Panel     In a typical week, how many times do you talk on the phone with family, friends, or neighbors?: More than three times a week     How often do you get together with friends or relatives?: Three times a week     How often do you attend church or religious services?: Never     Do you belong to any clubs or organizations such as church groups, unions, fraternal or athletic groups, or school groups?: No     How often do you attend meetings of the clubs or organizations you belong to?: Never     Are you married, widowed, divorced, separated, never married, or living with a partner?: Widowed   Housing Stability: High Risk (11/23/2022)    Received from [place] [facility] [employer]    Housing Stability Vital Sign     Number of Places Lived in the Last Year: 3     Unstable Housing in the Last Year: Yes       PHYSICAL EXAM    (up to 7 for level 4, 8 or more for level 5)     ED Triage Vitals [12/17/23 1641]   BP Girls Systolic BP Percentile Girls Diastolic BP Percentile Boys Systolic BP Percentile Boys Diastolic BP Percentile Temp Temp Source Pulse   (!) 158/88 -- -- -- -- 97.3 F (36.3 C) Oral 75      Respirations SpO2 Height Weight - Scale       16 99 % 1.778 m (5' 10) 79.4 kg (175 lb)           Body mass index is 25.85 kg/m.    Physical Exam    See mdm    DIAGNOSTIC RESULTS     EKG: All EKG's are interpreted by the Emergency Department Physician who either signs or Co-signs this chart in the absence of a cardiologist.        RADIOLOGY:   Non-plain film images such as CT, Ultrasound and MRI are read by the radiologist.    Interpretation per the Radiologist below, if available at the time of this note:    CT HEAD WO CONTRAST   Final Result   No acute process or change compared the prior exam.            Electronically signed by DARICE COLON           LABS:  Labs Reviewed   CBC WITH AUTO  DIFFERENTIAL - Abnormal; Notable for the following components:       Result Value    Lymphocytes % 59 (*)     Monocytes % 3 (*)     Lymphocytes Absolute 5.73 (*)     All other components within normal limits   COMPREHENSIVE METABOLIC PANEL - Abnormal; Notable for the following components:    Anion Gap 16 (*)     Glucose 135 (*)     BUN/Creatinine Ratio 11 (*)     Calcium 8.5 (*)     ALT 81 (*)     AST 55 (*)     All other components within normal limits   ACETAMINOPHEN LEVEL - Abnormal; Notable for the following components:    Acetaminophen Level <5 (*)     All other components within normal limits   ETHANOL -  Abnormal; Notable for the following components:    Ethanol Lvl 330 (*)     All other components within normal limits   ETHANOL - Abnormal; Notable for the following components:    Ethanol Lvl 184 (*)     All other components within normal limits   URINE CULTURE HOLD SAMPLE   URINALYSIS WITH MICROSCOPIC   URINE DRUG SCREEN   MAGNESIUM       All other labs were within normal range or not returned as of this dictation.        PROCEDURES:  Unless otherwise noted below, none     Procedures    See MDM for documentation of critical care time.       FINAL IMPRESSION    No diagnosis found.      DISPOSITION/PLAN   DISPOSITION        PATIENT REFERRED TO:  No follow-up provider specified.    DISCHARGE MEDICATIONS:  New Prescriptions    No medications on file         (Please note that portions of this note were completed with a voice recognition program.  Efforts were made to edit the dictations but occasionally words are mis-transcribed.)    Bradely Rudin, DO (electronically signed)  Emergency Attending Physician / Physician Assistant / Nurse Practitioner             King Sensor, DO  12/18/23 0015

## 2023-12-18 ENCOUNTER — Inpatient Hospital Stay
Admission: EM | Admit: 2023-12-18 | Discharge: 2023-12-22 | Disposition: A | Discharge status: Home / Self Care | Payer: Medicaid (Managed Care) | Source: Other Acute Inpatient Hospital | Attending: Psychiatry | Admitting: Psychiatry

## 2023-12-18 DIAGNOSIS — F39 Unspecified mood [affective] disorder: Secondary | ICD-10-CM

## 2023-12-18 DIAGNOSIS — F319 Bipolar disorder, unspecified: Secondary | ICD-10-CM

## 2023-12-18 LAB — ETHANOL: Ethanol Lvl: 184 mg/dL — ABNORMAL HIGH (ref <10)

## 2023-12-18 MED ORDER — LORAZEPAM 2 MG/ML PO CONC
2 | ORAL | Status: DC | PRN
Start: 2023-12-18 — End: 2023-12-19

## 2023-12-18 MED ORDER — LORAZEPAM 1 MG PO TABS
1 | ORAL | Status: DC | PRN
Start: 2023-12-18 — End: 2023-12-19
  Administered 2023-12-19 (×2): 2 mg via ORAL

## 2023-12-18 MED ORDER — HALOPERIDOL LACTATE 5 MG/ML IJ SOLN
5 | INTRAMUSCULAR | Status: DC | PRN
Start: 2023-12-18 — End: 2023-12-22

## 2023-12-18 MED ORDER — HYDROXYZINE HCL 25 MG PO TABS
25 | Freq: Three times a day (TID) | ORAL | Status: DC | PRN
Start: 2023-12-18 — End: 2023-12-22
  Administered 2023-12-20: 01:00:00 50 mg via ORAL

## 2023-12-18 MED ORDER — LORAZEPAM 1 MG PO TABS
1 | ORAL | Status: DC | PRN
Start: 2023-12-18 — End: 2023-12-18

## 2023-12-18 MED ORDER — MULTIPLE VITAMINS PO TABS
Freq: Every day | ORAL | Status: DC
Start: 2023-12-18 — End: 2023-12-22
  Administered 2023-12-18 – 2023-12-22 (×5): 1 via ORAL

## 2023-12-18 MED ORDER — LORAZEPAM 2 MG/ML IJ SOLN
2 | INTRAMUSCULAR | Status: DC | PRN
Start: 2023-12-18 — End: 2023-12-18

## 2023-12-18 MED ORDER — LORAZEPAM 1 MG PO TABS
1 | ORAL | Status: DC | PRN
Start: 2023-12-18 — End: 2023-12-19

## 2023-12-18 MED ORDER — PHENOBARBITAL 32.4 MG PO TABS
32.4 | Freq: Four times a day (QID) | ORAL | Status: DC
Start: 2023-12-18 — End: 2023-12-18

## 2023-12-18 MED ORDER — LORAZEPAM 1 MG PO TABS
1 | ORAL | Status: DC | PRN
Start: 2023-12-18 — End: 2023-12-19
  Administered 2023-12-19: 01:00:00 1 mg via ORAL

## 2023-12-18 MED ORDER — NORMAL SALINE FLUSH 0.9 % IV SOLN
0.9 | Freq: Two times a day (BID) | INTRAVENOUS | Status: DC
Start: 2023-12-18 — End: 2023-12-18

## 2023-12-18 MED ORDER — TRAZODONE HCL 50 MG PO TABS
50 | Freq: Every evening | ORAL | Status: DC | PRN
Start: 2023-12-18 — End: 2023-12-22
  Administered 2023-12-20 – 2023-12-22 (×3): 50 mg via ORAL

## 2023-12-18 MED ORDER — ALUM & MAG HYDROXIDE-SIMETH 200-200-20 MG/5ML PO SUSP
200-200-20 | Freq: Four times a day (QID) | ORAL | Status: DC | PRN
Start: 2023-12-18 — End: 2023-12-22

## 2023-12-18 MED ORDER — PHENOBARBITAL 32.4 MG PO TABS
32.4 | Freq: Two times a day (BID) | ORAL | Status: DC
Start: 2023-12-18 — End: 2023-12-18

## 2023-12-18 MED ORDER — LORAZEPAM 1 MG PO TABS
1 | ORAL | Status: DC | PRN
Start: 2023-12-18 — End: 2023-12-18
  Administered 2023-12-18: 12:00:00 2 mg via ORAL

## 2023-12-18 MED ORDER — DIPHENHYDRAMINE HCL 50 MG/ML IJ SOLN
50 | INTRAMUSCULAR | Status: DC | PRN
Start: 2023-12-18 — End: 2023-12-22

## 2023-12-18 MED ORDER — SODIUM CHLORIDE 0.9 % IV SOLN
0.9 | INTRAVENOUS | Status: DC | PRN
Start: 2023-12-18 — End: 2023-12-18

## 2023-12-18 MED ORDER — POLYETHYLENE GLYCOL 3350 17 G PO PACK
17 | Freq: Every day | ORAL | Status: DC | PRN
Start: 2023-12-18 — End: 2023-12-22

## 2023-12-18 MED ORDER — FOLIC ACID 1 MG PO TABS
1 | Freq: Every day | ORAL | Status: DC
Start: 2023-12-18 — End: 2023-12-22
  Administered 2023-12-18 – 2023-12-22 (×5): 1 mg via ORAL

## 2023-12-18 MED ORDER — PHENOBARBITAL 32.4 MG PO TABS
32.4 | Freq: Four times a day (QID) | ORAL | Status: DC | PRN
Start: 2023-12-18 — End: 2023-12-18

## 2023-12-18 MED ORDER — ACETAMINOPHEN 325 MG PO TABS
325 | ORAL | Status: DC | PRN
Start: 2023-12-18 — End: 2023-12-22

## 2023-12-18 MED ORDER — THIAMINE HCL 100 MG PO TABS
100 | Freq: Every day | ORAL | Status: DC
Start: 2023-12-18 — End: 2023-12-22
  Administered 2023-12-18 – 2023-12-22 (×5): 100 mg via ORAL

## 2023-12-18 MED ORDER — HALOPERIDOL 5 MG PO TABS
5 | ORAL | Status: DC | PRN
Start: 2023-12-18 — End: 2023-12-22

## 2023-12-18 MED ORDER — NORMAL SALINE FLUSH 0.9 % IV SOLN
0.9 | INTRAVENOUS | Status: DC | PRN
Start: 2023-12-18 — End: 2023-12-18

## 2023-12-18 MED FILL — FOLIC ACID 1 MG PO TABS: 1 mg | ORAL | Qty: 1 | Fill #0

## 2023-12-18 MED FILL — MULTIVITAMIN ADULT PO TABS: ORAL | Qty: 1 | Fill #0

## 2023-12-18 MED FILL — LORAZEPAM 1 MG PO TABS: 1 mg | ORAL | Qty: 2 | Fill #0

## 2023-12-18 MED FILL — THIAMINE HCL 100 MG PO TABS: 100 mg | ORAL | Qty: 1 | Fill #0

## 2023-12-18 MED FILL — BD POSIFLUSH 0.9 % IV SOLN: 0.9 % | INTRAVENOUS | Qty: 40 | Fill #0

## 2023-12-18 NOTE — Behavioral Health Treatment Team (Signed)
 Bradley [name] did not order a 1:1 on admission to the [facility] for this patient based on the information provided by the ED team. Should the patient's presentation change then the on call provider should be consulted for further 1:1 needs.

## 2023-12-18 NOTE — ED Notes (Signed)
 TRANSFER - OUT REPORT:    Verbal report given to Jenna, RN on Bradley [name]  being transferred to [facility] ED for routine progression of patient care       Report consisted of patient's Situation, Background, Assessment and   Recommendations(SBAR).     Information from the following report(s) Nurse Handoff Report, ED SBAR, and Recent Results was reviewed with the receiving nurse.    Kinder Fall Assessment:    Presents to emergency department  because of falls (Syncope, seizure, or loss of consciousness): No  Age > 70: No  Altered Mental Status, Intoxication with alcohol or substance confusion (Disorientation, impaired judgment, poor safety awaremess, or inability to follow instructions): No  Impaired Mobility: Ambulates or transfers with assistive devices or assistance; Unable to ambulate or transer.: No  Nursing Judgement: No          Lines:   Peripheral IV 12/17/23 Proximal;Right;Dorsal Forearm (Active)        Opportunity for questions and clarification was provided.      Patient transported with:  [facility] transport services

## 2023-12-18 NOTE — Other (Signed)
 BSMART Update Note  Clinician aware of telepsych recommendation for psychiatric admission. Per telepsych consult - Patient presents with SI, does not endorse a plan and initial ETOH intoxication  of 330. BAL under 200 at time of eval. Pt is A&Ox4. Pt presented as intermittently euthymic and cooperative. Pt shared he has been depressed and suicidal lately. Pt shared he is taking no psych medications and has not had outpatient follow up in many years. Pt's appetite and sleep are both decreased. Pt shared he is drinking excessively in addition to drug use. Pt denies a reliable support system, having 2 adult children but most of his family is deceased or strained. Pt is not employed. Pt shared he has been depressed and is interested in medication management. He is unable to verbally contract to safety. Pt is requesting inpatient admission for mood stabilization and safety.     Clinician met with patient via teledoc using ED phone for audio. Patient is voluntary for admission and agreeable to bed search at any [facility] facility and [place] area. He reports no difficulties performing ADL's or ambulating. He also reports no medical devices, open wounds, or hx of seizures. Patient does report he has fallen before but says that this was about a month ago and has not happened since. Patient did ask telepsych about discharging to a rehab facility once treatment is complete.    Access aware of patient - once patient is medically cleared, bed search will be initiated.

## 2023-12-18 NOTE — ED Notes (Signed)
 Pt on with BSMART.

## 2023-12-18 NOTE — Other (Signed)
 BSMART Note    Patient has been accepted to [facility] room 310 bed 1 by NP Turner for Dr. Morene Alert.  The number for nurse to nurse report is [phone].    Bradley DELENA Reedy, MA

## 2023-12-18 NOTE — ED Notes (Signed)
 Pt is being transported to [facility] ED by [facility].

## 2023-12-18 NOTE — Behavioral Health Treatment Team (Signed)
 [facility]  Admission Note     Admission Type:   Admission Type: Voluntary    Reason for admission:  Reason for Admission: Suicidal ideations, homelessness, depression    Addictive Behavior:    Patient is a daily drinker    Medical Problems:   Past Medical History:   Diagnosis Date    Abuse, drug or alcohol (HCC)     alcohol abuse       Status EXAM:  Mental Status and Behavioral Exam  Normal: No  Level of Assistance: Independent/Self  Facial Expression: Flat  Affect: Congruent, Stable  Level of Consciousness: Alert  Frequency of Checks: 4 times per hour, close  Mood:Normal: No  Mood: Depressed, Anxious  Motor Activity:Normal: Yes  Eye Contact: Fair  Observed Behavior: Cooperative  Sexual Misconduct History: Current - no  Preception: Orient to situation, Orient to person, Orient to time, Orient to place  Attention:Normal: Yes  Thought Processes: Unremarkable  Thought Content:Normal: Yes  Depression Symptoms: Appetite change, Change in energy level, Feelings of helplessness, Feelings of hopelessess  Anxiety Symptoms: Generalized  Mania Symptoms: No problems reported or observed.  Hallucinations: None  Delusions: No  Memory:Normal: Yes  Insight and Judgment: No  Insight and Judgment: Poor judgment    Pt admitted with followings belongings:   Belt, jeans, t-shirts, sneakers, underwear, sweat pants, wallet, backpack, personal toiletries   Valuables placed in safe in security envelope, number:  [id number]. Patient oriented to surroundings and program expectations and copy of patient rights given. Received admission packet:  Yes.  Consents reviewed, signed Yes. Patient verbalize understanding:  Yes.    Patient education on precautions: Yes    Patient scored high risk on C-SSRS and suicide risk assessment. Suicide precautions and constant observation, flowsheet initiated. Patient denies thoughts of hurting self while in the unit stating, I feel hopeless about my situation. I'm homeless and I feel depressed  that's why I was having suicidal thoughts but I don't wanna act on them. Patient agrees to notify nursing staff if thoughts of slef-harm arise. Suicide precautions and constant observation discontinued by Dr. Igbide via telephone with read back at 1345. Standard BHU orders obtained and Q 15 minute checks continued for safety.                 Jolene Guyett Marie Emberlyn Burlison, RN

## 2023-12-18 NOTE — Video Visit Notes (Signed)
 Bradley [name]  Apr 01, 1965     Social Work Behavioral Health Crisis Assessment    12/18/23    Chief Complaint: Pt here via EMS with c/o of ETOH abuse, using crack today. Pt endorses SI/HI and states his target of HI is a woman he can't find. Pt appears paranoid and distrusting of staff.    HPI: Patient is a 58 y.o. male who presents for a psych eval. Patient presented to the ED on 12/18/23 from the community.    Patient presents with SI, does not endorse a plan and initial ETOH intoxication  of 330. BAL under 200 at time of eval. Pt is A&Ox4. Pt presented as intermittently euthymic and cooperative. Pt shared he has been depressed and suicidal lately.   Pt shared he is taking no psych medications and has not had outpatient follow up in many years. Pt's appetite and sleep are both decreased. Pt shared he is drinking excessively in addition to drug use.     Pt denies a reliable support system, having 2 adult children but most of his family is deceased or strained. Pt is not employed.     Pt shared he has been depressed and is interested in medication management. He is unable to verbally contract to safety. Pt is requesting inpatient admission for mood stabilization and safety.       Collateral: denies reliable support system    Past Psychiatric History:  Previous Diagnoses/symptoms: Alcohol dependence, Anxiety disorder, Major depressive disorder, recurrent severe without psychotic features  Previous suicide attempts/self-harm: Intentional self-harm by knife  Inpatient psychiatric hospitalizations: yes  Current outpatient psychiatric provider: Denies  Current therapist: States not in therapy  Previous psychiatric medication trials: several - Celexa, Depakote,   Current psychiatric medications: denies taking  Family Psychiatric History: Denies    Sleep Hours: 4    Sleep concerns: difficulty attaining sleep    Use of sleep medications: Melatonin    Substance Abuse History:  Tobacco: Endorses    Alcohol: Endorses  daily/excessive   Marijuana: Denies  Stimulant: Denies  Opiates: Denies  Benzodiazepine: Denies  Other illicit drug usage: cocaine  History of substance/alcohol abuse treatment: Yes    Social History:  Education: less than high school  Living Situation/Interest: Sheltered homelessness  Marital/Committed relationship and parenting hx: single  Occupation: Media planner History/Hx of Violence: has been incarcerated   Spiritual History: Denies  Psychological trauma, neglect, or abuse: denies hx of trauma/abuse   Access to guns or other weapons: denies having access to [employer]     Past Medical History:  Active Ambulatory Problems     Diagnosis Date Noted    Major depression, single episode 02/19/2021     Resolved Ambulatory Problems     Diagnosis Date Noted    No Resolved Ambulatory Problems     Past Medical History:   Diagnosis Date    Abuse, drug or alcohol (HCC)      Allergies:  No Known Allergies   Medications:  No current facility-administered medications for this encounter.    Current Outpatient Medications:     ibuprofen  (ADVIL ;MOTRIN ) 600 MG tablet, Take 1 tablet by mouth every 6 hours as needed for Pain or Fever, Disp: 21 tablet, Rfl: 0  Not in a hospital admission.  Prior to Admission medications   Medication Sig Start Date End Date Taking? Authorizing Provider   ibuprofen  (ADVIL ;MOTRIN ) 600 MG tablet Take 1 tablet by mouth every 6 hours as needed for Pain or Fever 11/02/23  Caye Toribio LABOR, MD        Labs:  UDS: negative  ETOH: 184 High  MG/DL at 7784 - patient assessed at 0115  HCG: n/a      Mental Status Exam:  Level of consciousness:  within normal limits   Appearance:  hospital attire and seated in bed.  Does appear stated age. No acute distress.  Behavior/Motor:  no abnormalities noted  Attitude toward examiner:  guarded  SI/HI:passive  Speech:  normal volume , Tone: normal tone  Mood: depressed  Affect: mood congruent and flat  Thought Processes:  coherent.   Thought Content: No  delusions or other perceptual abnormalities  Hallucinations:  Hallucinations: +Auditory Hallucinations  Cognition:  oriented to person, place, and time   Concentration: intact  Memory: intact, though not formally tested.  Insight: fair   Judgement: poor   Fund of Knowledge: adequate      Risk Assessment:  C-SSRS Score       12/17/2023     4:44 PM   C-SSRS Suicide Screening   1) Within the past month, have you wished you were dead or wished you could go to sleep and not wake up?  Yes   2) Have you actually had any thoughts of killing yourself?  Yes   3) Have you been thinking about how you might kill yourself?  Yes   4) Have you had these thoughts and had some intention of acting on them?  Yes   5) Have you started to work out or worked out the details of how to kill yourself? Do you intend to carry out this plan?  Yes   6) Have you ever done anything, started to do anything, or prepared to do anything to end your life? No    :    Protective Factors:  Protective: Does not have access to guns and No active psychosis or cognitive dysfunction  Risk Factors:  Risk Factors: Age <25 or >23, Male gender, Depressed mood, Active suicidal ideation, Prior suicide attempt, Active substance abuse, Not attending to self-care/ADLs, Recent significant loss, Social isolation, No outpatient services in place, Medication noncompliance, and No collateral information to support safety      Overall Level Suicide Risk: chronically high    TelePsych CSSRS Risk Level: High Risk    Brief ClinicalSummary:   Patient is a 58 y.o. Caucasian male who presents for a psych eval.    On assessment patient is calm and oriented. Pt with worsening depression, alcohol abuse and self-harm thoughts proving to be danger to self as he is not able to contact for safety, nor does he have a support system or outpatient supports. Patient reports drinking daily and also using cocaine. Patient auditory hallucinations. Patient has poor sleep, anxiety and feeling of  hopelessness, worthlessness and helplessness.     Risk factors are most closely tied to ongoing substance abuse and homelessness and would be best modified by treatment for SUD in a long term rehab setting following psych stabilization. Given the degree of his hopelessness, medication nonadherence, and willingness to participate in detoxification/rehabilitation, patient would benefit from stabilization/dual diagnosis treatments.    Recommending inpatient hospitalization to restart medications and stabilize as well as keep patient safe then reporting he would like to discharge to rehab facility.       Dx:   Mood disorder  Alcohol use disorder     Plan:  Inpatient psychiatric admission at appropriate care level facility, once medically cleared and stable and The  patient is agreeable to voluntary psychiatric admission.  Ongoing medical management and stabilization per primary team.  Re-consult for any new changes or concerns. Thank you for this consult.  Discussed recommendations with Attending Lenon Celestine ORN, MD via [facility] messaging at time of consult completion.    TelePsych recommendations:Inpatient psychiatric admission            Safety Plan:  See below        Electronically signed by Inocente Dredge, LCSW on 12/18/2023 at 1:22 AM.     Michelina JULIANNA Burdock, was evaluated through a synchronous (real-time) audio-video encounter. The patient (and/or guardian if applicable) is aware that this is a billable service, which includes applicable co-pays. This virtual visit was conducted with patient's (and/or legal guardian's) consent. Patient identification was verified, and a caregiver was present when appropriate.  The patient was located at Facility (Appt Department): [facility] EMERGENCY DEPARTMENT  [address]  [place] [address]  Loc: [phone]  The provider was located at Home (City/State): [place]   Confirm you are appropriately licensed, registered, or certified to deliver  care in the state where the patient is located as indicated above. If you are not or unsure, please re-schedule the visit: Yes, I confirm.   [facility] Consult to [employer]  Consult performed by: Dredge Inocente, LCSW  Consult ordered by: Guzzardo, Marissa, DO           Total time spent on this encounter: 69    --Inocente Dredge, LCSW on 12/18/2023 at 1:11 AM    An electronic signature was used to authenticate this note.

## 2023-12-18 NOTE — ED Notes (Signed)
 ED TO INPATIENT SBAR HANDOFF    Patient Name: Bradley [name]   DOB:  07/25/1965  58 y.o.   MRN:  [id number]  ED Room #:  ER12/12     Situation  Code Status: No Order   Allergies: Patient has no known allergies.  Weight: Patient Vitals for the past 96 hrs (Last 3 readings):   Weight   12/17/23 1643 77.1 kg (170 lb)   12/17/23 1641 79.4 kg (175 lb)       Arrived from: [place] ED    Chief Complaint:   Chief Complaint   Patient presents with    Suicidal    Alcohol Intoxication       Hospital Problem/Diagnosis:  Active Problems:    * No active hospital problems. *  Resolved Problems:    * No resolved hospital problems. *      Mobility: stand-by assist  ED Fall Risk: Presents to emergency department  because of falls (Syncope, seizure, or loss of consciousness): No, Age > 70: No, Altered Mental Status, Intoxication with alcohol or substance confusion (Disorientation, impaired judgment, poor safety awaremess, or inability to follow instructions): No, Impaired Mobility: Ambulates or transfers with assistive devices or assistance; Unable to ambulate or transer.: No, Nursing Judgement: No   Fell in ED or prior to admission: no   Restraints: no     Sitter: yes   Family/Caregiver Present: no    Neet to know social/safety information: SI with plan, pt remains in green safety gown, sitter at bedside, pt's personal belongings remain in green safety cart    Background  History:   Past Medical History:   Diagnosis Date    Abuse, drug or alcohol (HCC)     alcohol abuse       Assessment    Abnormal Assessment Findings: Suicidal ideations with plan, alcohol abuse, alcohol withdrawal   Imaging:   CT HEAD WO CONTRAST   Final Result   No acute process or change compared the prior exam.            Electronically signed by DARICE COLON        Abnormal labs:   Abnormal Labs Reviewed   CBC WITH AUTO DIFFERENTIAL - Abnormal; Notable for the following components:       Result Value    Lymphocytes % 59 (*)     Monocytes % 3 (*)     Lymphocytes  Absolute 5.73 (*)     All other components within normal limits   COMPREHENSIVE METABOLIC PANEL - Abnormal; Notable for the following components:    Anion Gap 16 (*)     Glucose 135 (*)     BUN/Creatinine Ratio 11 (*)     Calcium 8.5 (*)     ALT 81 (*)     AST 55 (*)     All other components within normal limits   ACETAMINOPHEN LEVEL - Abnormal; Notable for the following components:    Acetaminophen Level <5 (*)     All other components within normal limits   ETHANOL - Abnormal; Notable for the following components:    Ethanol Lvl 330 (*)     All other components within normal limits   ETHANOL - Abnormal; Notable for the following components:    Ethanol Lvl 184 (*)     All other components within normal limits         Vitals/MEWS: MEWS Score: 1  Level of Consciousness: Alert (0)   Vitals:  12/18/23 0844 12/18/23 0958 12/18/23 1004 12/18/23 1037   BP: (!) 142/68 (!) 140/83  (!) 151/78   Pulse: 69 78  75   Resp: 17 15 15 16    Temp: 98.1 F (36.7 C) 98.4 F (36.9 C)  97.4 F (36.3 C)   TempSrc: Oral Oral  Oral   SpO2: 97% 97%  94%   Weight:       Height:         DI:   Predictive Model Details   No score data available for Deterioration Index        FiO2 (%): room air  O2 Flow Rate: O2 Device: None (Room air)    Cardiac Rhythm:      Pain Scale: Pain Assessment  Pain Assessment: 0-10  Pain Level: 0  Patient's Stated Pain Goal: 0 - No pain  Pain Location: Other (Comment)  Pain Orientation: Other (Comment)  Pain Descriptors: Aching  Last documented pain score: (0-10 scale) Pain Level: 0  Last documented pain medication administered: none     Mental Status: oriented and alert  Orientation Level:    NIH Score:    C-SSRS: Risk of Suicide: High Risk    PO Status: Regular  Bedside swallow: 3 oz Water Swallow Screen: Pass  Glasgow Coma Scale (GCS): Glasgow Coma Scale  Eye Opening: Spontaneous  Best Verbal Response: Oriented  Best Motor Response: Obeys commands  Glasgow Coma Scale Score: 15    Active LDA's:    Pertinent or  High Risk Medications/Drips: no   If Yes, please provide details: n/a  Titratable drips: no   Pending Blood Product Administration: no     Sepsis: Sepsis Risk Score   Predictive Model Details          4 (Low)  Factor Value    Calculated 12/18/2023 11:06 16% O2 Delivery Method ROOM AIR    [facility] EARLY DETECTION OF SEPSIS VERSION 2 Model 67% Age 68 years old     -7% Duration of Encounter .8 days     -6% Albumin 4.5 g/dL     4% Total Active Inpatient Meds 11     -3% Antirheumatic Admins Last 24 Hours 0     3% Relative Monocytes 3 %     -3% Respirations 16     3% Has Imaging Procedure present     -3% Pain Scale 0      Recent Labs     12/17/23  1656   WBC 9.7       Recommendation    Plan for next 24 hours: IP Psych Admission  Additional Comments: n/a     Isolation: none  Pending orders: n/a  Consults ordered: IP CONSULT TO TELE-PSYCH (SOCIAL WORK ONLY)  IP CONSULT TO BSMART  IP CONSULT TO SOCIAL WORK    Consulted provider:      If any further questions, please call Sending RN at [phone]  Electronically signed by: Electronically signed by Andriette Na, RN on 12/18/2023 at 11:16 AM

## 2023-12-18 NOTE — ED Notes (Signed)
 Pt sent via stretcher from [facility] ED to [facility] with Carlo, [facility] (EMT) at 1218. Patient's belongings present on EMS stretcher during discharge time.

## 2023-12-18 NOTE — ED Notes (Signed)
 Pt arrives as a transfer to [facility] from [facility] ED for SI. Pt arrives A/Ox4 dressed in a green gown. Pt arrives cc headache and nausea. CIWA score completed on pt's arrival.     Pietro, MD at bedside [phone].

## 2023-12-18 NOTE — Plan of Care (Signed)
 Problem: Discharge Planning  Goal: Discharge to home or other facility with appropriate resources  Outcome: Not Progressing     Problem: Anxiety  Goal: Will report anxiety at manageable levels  Description: INTERVENTIONS:  1. Administer medication as ordered  2. Teach and rehearse alternative coping skills  3. Provide emotional support with 1:1 interaction with staff  12/18/2023 2313 by Si Dragon, RN  Outcome: Not Progressing     Problem: Seizure Precautions  Goal: Remains free of injury related to seizures activity  Outcome: Progressing     Problem: Self Harm/Suicidality  Goal: Will have no self-injury during hospital stay  Description: INTERVENTIONS:  1.  Ensure constant observer at bedside with Q15M safety checks  2.  Maintain a safe environment  3.  Secure patient belongings  4.  Ensure family/visitors adhere to safety recommendations  5.  Ensure safety tray has been added to patient's diet order  6.  Every shift and PRN: Re-assess suicidal risk via Frequent Screener    Outcome: Progressing     Problem: Drug Abuse/Detox  Goal: Will have no detox symptoms and will verbalize plan for changing drug-related behavior  Description: INTERVENTIONS:  1. Administer medication as ordered  2. Monitor physical status  3. Provide emotional support with 1:1 interaction with staff  4. Encourage  recovery focused treatment   Outcome: Progressing

## 2023-12-18 NOTE — Plan of Care (Signed)
 Problem: Anxiety  Goal: Will report anxiety at manageable levels  Description: INTERVENTIONS:  1. Administer medication as ordered  2. Teach and rehearse alternative coping skills  3. Provide emotional support with 1:1 interaction with staff  Outcome: Not Progressing     Problem: Depression/Self Harm  Goal: Effect of psychiatric condition will be minimized and patient will be protected from self harm  Description: INTERVENTIONS:  1. Assess impact of patient's symptoms on level of functioning, self care needs and offer support as indicated  2. Assess patient/family knowledge of depression, impact on illness and need for teaching  3. Provide emotional support, presence and reassurance  4. Assess for possible suicidal thoughts or ideation. If patient expresses suicidal thoughts or statements do not leave alone, initiate Suicide Precautions, move to a room close to the nursing station and obtain sitter  5. Initiate consults as appropriate with Mental Health Professional, Spiritual Care, Psychosocial CNS, and consider a recommendation to the LIP for a Psychiatric Consultation  Outcome: Not Progressing

## 2023-12-18 NOTE — ED Notes (Signed)
 Pt is being evaluated by tele-psych.

## 2023-12-18 NOTE — ED Notes (Signed)
 Report given to Jon Arlington, RN at [facility].

## 2023-12-18 NOTE — ED Notes (Signed)
 [facility] arrived at bedside to [facility] for transfer to [facility].

## 2023-12-19 DIAGNOSIS — F39 Unspecified mood [affective] disorder: Principal | ICD-10-CM

## 2023-12-19 MED ORDER — PHENOBARBITAL 32.4 MG PO TABS
32.4 | Freq: Four times a day (QID) | ORAL | Status: AC
Start: 2023-12-19 — End: 2023-12-20
  Administered 2023-12-19 – 2023-12-20 (×3): 64.8 mg via ORAL

## 2023-12-19 MED ORDER — DIVALPROEX SODIUM 500 MG PO TBEC
500 | Freq: Two times a day (BID) | ORAL | Status: DC
Start: 2023-12-19 — End: 2023-12-22
  Administered 2023-12-19 – 2023-12-22 (×7): 500 mg via ORAL

## 2023-12-19 MED ORDER — CITALOPRAM HYDROBROMIDE 20 MG PO TABS
20 | Freq: Every day | ORAL | Status: DC
Start: 2023-12-19 — End: 2023-12-22
  Administered 2023-12-19 – 2023-12-22 (×4): 10 mg via ORAL

## 2023-12-19 MED ORDER — PHENOBARBITAL 32.4 MG PO TABS
32.4 | Freq: Four times a day (QID) | ORAL | Status: DC | PRN
Start: 2023-12-19 — End: 2023-12-22

## 2023-12-19 MED ORDER — PHENOBARBITAL 32.4 MG PO TABS
32.4 | Freq: Four times a day (QID) | ORAL | Status: AC | PRN
Start: 2023-12-19 — End: 2023-12-20

## 2023-12-19 MED ORDER — PHENOBARBITAL 32.4 MG PO TABS
32.4 | Freq: Two times a day (BID) | ORAL | Status: AC
Start: 2023-12-19 — End: 2023-12-22
  Administered 2023-12-22 (×2): 32.4 mg via ORAL

## 2023-12-19 MED ORDER — PHENOBARBITAL 32.4 MG PO TABS
32.4 | Freq: Two times a day (BID) | ORAL | Status: DC
Start: 2023-12-19 — End: 2023-12-22

## 2023-12-19 MED ORDER — PHENOBARBITAL 32.4 MG PO TABS
32.4 | Freq: Four times a day (QID) | ORAL | Status: AC
Start: 2023-12-19 — End: 2023-12-21
  Administered 2023-12-20 – 2023-12-21 (×4): 32.4 mg via ORAL

## 2023-12-19 MED FILL — PHENOBARBITAL 32.4 MG PO TABS: 32.4 mg | ORAL | Qty: 2 | Fill #0

## 2023-12-19 MED FILL — LORAZEPAM 1 MG PO TABS: 1 mg | ORAL | Qty: 2 | Fill #0

## 2023-12-19 MED FILL — MULTIVITAMIN ADULT PO TABS: ORAL | Qty: 1 | Fill #0

## 2023-12-19 MED FILL — FOLIC ACID 1 MG PO TABS: 1 mg | ORAL | Qty: 1 | Fill #0

## 2023-12-19 MED FILL — THIAMINE HCL 100 MG PO TABS: 100 mg | ORAL | Qty: 1 | Fill #0

## 2023-12-19 MED FILL — LORAZEPAM 1 MG PO TABS: 1 mg | ORAL | Qty: 1 | Fill #0

## 2023-12-19 MED FILL — CITALOPRAM HYDROBROMIDE 20 MG PO TABS: 20 mg | ORAL | Qty: 1 | Fill #0

## 2023-12-19 MED FILL — DIVALPROEX SODIUM 500 MG PO TBEC: 500 mg | ORAL | Qty: 1 | Fill #0

## 2023-12-19 NOTE — Behavioral Health Treatment Team (Signed)
 NIGHT SHIFT ASSESSMENT COMPLETED 19:30-0730     Markez is alert and oriented to person, time, place, and situation.  Encountered by the writer resting in bed resting quietly. Calm and cooperative with assessment questions.  Affect flat depressed/anxious mood.   Reported having anxiety 4 and headache. No depression. No SI or HI. No auditory or visual hallucinations. C-SSRS screening no risk. Speech clear and makes fair eye contact.  CIWA score 8 pt received pt ativan 1 mg. Medication effective on reassessment CIWA score 2. Emotional support and encouragement provided  Hourly rounding and Q15 minutes check for safety and behavior maintained.  No aggression behavior.    Pt slept for 8 hrs

## 2023-12-19 NOTE — Behavioral Health Treatment Team (Signed)
 COMPREHENSIVE BEHAVIORAL HEALTH ASSESSMENT    Section A: Identifying Information  Client Name: Bradley [name] is a 58 y.o. male   Date of Assessment: 12/19/23  Assessor Name & Credentials: Vernell Admire, MSW, Supervisee in Social Work   Program/Service: Inpatient Behavioral Health  Date of Admission: 12/18/2023 12:55 PM  Type of Assessment: Initial     Legal Status:  Authorized Representative: not reported  Admission type/Commitment: Voluntary  Payee?: not reported    Section B: Initial Assessment  Diagnosis:  Primary Diagnosis: Mood disorder  Secondary/Co-occurring Diagnoses: yes- alcohol use disorder  Presenting Needs:  Per telepsych assessment: Chief Complaint: Pt here via EMS with c/o of ETOH abuse, using crack today. Pt endorses SI/HI and states his target of HI is a woman he can't find. Pt appears paranoid and distrusting of staff.  HPI: Patient is a 58 y.o. male who presents for a psych eval. Patient presented to the ED on 12/18/23 from the community.  Patient presents with SI, does not endorse a plan and initial ETOH intoxication  of 330. BAL under 200 at time of eval. Pt is A&Ox4. Pt presented as intermittently euthymic and cooperative. Pt shared he has been depressed and suicidal lately.   Pt shared he is taking no psych medications and has not had outpatient follow up in many years. Pt's appetite and sleep are both decreased. Pt shared he is drinking excessively in addition to drug use.   Pt denies a reliable support system, having 2 adult children but most of his family is deceased or strained. Pt is not employed.   Pt shared he has been depressed and is interested in medication management. He is unable to verbally contract to safety. Pt is requesting inpatient admission for mood stabilization and safety.   Per chart review: Pt has multiple inpatient admissions, often in the context of substance misuse   On unit: Pt denies SI/HI/AVH. Pt endorses anxiety. Pt remains on CIWA precautions due to  alcohol use. Pt presents isolative. Pt reports lack of outpatient support   Stated Needs: Pt endorses needs related to the following areas: outpatient support, housing, medication management   MSE: Level of consciousness:  within normal limits  Appearance:  hospital attire  Behavior/Motor:  no abnormalities noted  Attitude toward examiner:  guarded  Speech:  normal rate and normal volume  Mood:  depressed   Affect:  flat  Thought processes:  coherent  Thought content:  hallucinations  Cognition:  Insight:  poor  Judgment:  poor  Collateral Information:   Psychiatric Needs: Patient presents with preoccupations , disorganized thought process, decreased sleep, feelings of hopelessness, feelings of helplessness, and feelings of worthlessness  Support Needs: Outpatient weekly therapy, Outpatient psychiatric medication management, Psychoeducation, Mental health skill builder (in person multiple days per week), and Street resources to include food banks, shelters, Conservator, museum/gallery  Onset & Duration of Problems: 2+ weeks   Current Medical Problems:  Past Medical History:   Diagnosis Date    Abuse, drug or alcohol (HCC)     alcohol abuse     Current Medications:  Prior to Admission Medications   Prescriptions Last Dose Informant Patient Reported? Taking?   ibuprofen  (ADVIL ;MOTRIN ) 600 MG tablet   No No   Sig: Take 1 tablet by mouth every 6 hours as needed for Pain or Fever      Facility-Administered Medications: None     Substance Use/Abuse History:  Current Use: UDS negative, using alcohol   Past Use: Alcohol use  Co-occurring Mental Health & Substance Use Issues: yes  At-Risk Behaviors (Harm to Self/Others: Age +59, Isolated/low support, Unstable housing, Depression, Verbalized feelings of hopelessness/helplessness/worthlessness  History of attempts: yes- cutting   Current Outpatient Providers: not reported    Section C: Comprehensive Assessment    Onset & Duration of Problems: Per telepsych assessment: Chief Complaint:  Pt here via EMS with c/o of ETOH abuse, using crack today. Pt endorses SI/HI and states his target of HI is a woman he can't find. Pt appears paranoid and distrusting of staff.  HPI: Patient is a 58 y.o. male who presents for a psych eval. Patient presented to the ED on 12/18/23 from the community.  Patient presents with SI, does not endorse a plan and initial ETOH intoxication  of 330. BAL under 200 at time of eval. Pt is A&Ox4. Pt presented as intermittently euthymic and cooperative. Pt shared he has been depressed and suicidal lately.   Pt shared he is taking no psych medications and has not had outpatient follow up in many years. Pt's appetite and sleep are both decreased. Pt shared he is drinking excessively in addition to drug use.   Pt denies a reliable support system, having 2 adult children but most of his family is deceased or strained. Pt is not employed.   Pt shared he has been depressed and is interested in medication management. He is unable to verbally contract to safety. Pt is requesting inpatient admission for mood stabilization and safety.   Per chart review: Pt has multiple inpatient admissions, often in the context of substance misuse   On unit: Pt denies SI/HI/AVH. Pt endorses anxiety. Pt remains on CIWA precautions due to alcohol use. Pt presents isolative. Pt reports lack of outpatient support   Social, Behavioral, Developmental, & Family History:   No family history on file.  Social History     Socioeconomic History    Marital status: Widowed     Spouse name: Not on file    Number of children: Not on file    Years of education: Not on file    Highest education level: Not on file   Occupational History    Not on file   Tobacco Use    Smoking status: Unknown    Smokeless tobacco: Not on file   Substance and Sexual Activity    Alcohol use: Yes     Alcohol/week: 40.0 - 70.0 standard drinks of alcohol     Types: 40 - 70 Standard drinks or equivalent per week    Drug use: Not Currently    Sexual  activity: Not on file   Other Topics Concern    Not on file   Social History Narrative    Not on file     Social Drivers of Health     Financial Resource Strain: High Risk (11/23/2022)    [facility]    Overall Financial Resource Strain (CARDIA)     Difficulty of Paying Living Expenses: Very hard   Food Insecurity: Food Insecurity Present (12/18/2023)    Hunger Vital Sign     Worried About Running Out of Food in the Last Year: Often true     Ran Out of Food in the Last Year: Often true   Transportation Needs: Unmet Transportation Needs (12/18/2023)    PRAPARE - Transportation     Lack of Transportation (Medical): Yes     Lack of Transportation (Non-Medical): Yes   Physical Activity: Inactive (11/23/2022)  Received from [facility]    Exercise Vital Sign     On average, how many days per week do you engage in moderate to strenuous exercise (like a brisk walk)?: 0 days     On average, how many minutes do you engage in exercise at this level?: 0 min   Stress: Stress Concern Present (11/23/2022)    Received from [facility]    [facility] - Occupational Stress Questionnaire     Feeling of Stress : Very much   Social Connections: Socially Isolated (11/23/2022)    Received from [facility]    Social Connection and Isolation Panel     In a typical week, how many times do you talk on the phone with family, friends, or neighbors?: More than three times a week     How often do you get together with friends or relatives?: Three times a week     How often do you attend church or religious services?: Never     Do you belong to any clubs or organizations such as church groups, unions, fraternal or athletic groups, or school groups?: No     How often do you attend meetings of the clubs or organizations you belong to?: Never     Are you married, widowed, divorced, separated, never married, or  living with a partner?: Widowed   Intimate Partner Violence: Not on file   Housing Stability: High Risk (12/18/2023)    Housing Stability Vital Sign     Unable to Pay for Housing in the Last Year: Yes     Number of Times Moved in the Last Year: 3     Homeless in the Last Year: Yes     Cognitive Functioning (Strengths & Weaknesses):   Strengths: ability to follow simple commands and attention to tasks  Weaknesses/Limitations: thought process/content, judgment/insight, and behavior  Employment, Hydrologist, Educational Background: Never finished high school, Unemployed, not seeking work  Previous Interventions & Outcomes:   Prior hospitalizations: yes- multiple  Architect & Benefits: Pharmacist, community as of 12/18/2023       Primary Coverage       Payor Plan Insurance Group Employer/Plan Group    [employer]INAL CARE        Payor Plan Address Payor Plan Phone Number Payor Plan Fax Number Effective Dates    [address] [phone]  08/21/2022 - None Entered    [place] [id number]         Subscriber Name Subscriber Birth Date Member ID       JONATHANDAVID, MARLETT 1965/07/01 [id number]                   Health History & Current Medical Needs:  Allergies:   Allergies  Reviewed by Deliva, Gem Marie, RN on 12/18/2023   No Known Allergies       Recent Complaints/Conditions: not reported  Nutritional Needs:  not reported  Chronic Conditions: not reported  Communicable Diseases: not reported  Activity Restrictions: not reported  Special Supervision/Protocols: not reported  Past Illnesses/Injuries/Hospitalizations: not reported  Family Medical History:  No family history on file.  Substance Use (Alcohol, Prescription, Nonprescription, Illicit):  positive for alcohol, BAL 330 on admission   Co-Occurring Psychiatric/Substance Use Issues:   Current Needs: rehab/residential substance use IOP and Harm reduction education/resources  Co-occurring Disorders: yes- alcohol use disorder and mood  disorder  History of Use/Abuse: Alcohol use  Risk Factors: co-occurring mental health disorder , hx of substance use, and homelessness  History of Abuse/Neglect/Trauma: yes-    [x] Incarceration [x] Homelessness   [] Physical Abuse [] Sexual Abuse   [] Emotional Abuse [] Does not disclose/Other   [] Childhood [] Adulthood     Criminal History (Charges/Convictions/Parole/Probation): yes- reports he has been incarcerated  Daily Living Skills:  Limitations: None identified  Housing Arrangements:   The patient is homeless. The patient lives alone.  Address on file:  [address]  [place] [address]  Service Access & Transportation Needs: not reported  As applicable, and in all residential services :  Fall Risk: not reported  Communication Methods/Needs: not reported  Mobility & Adaptive Equipment: not reported

## 2023-12-19 NOTE — Care Coordination-Inpatient (Signed)
 12/19/23 0842   Suicidal Ideation   Wish to be Dead Lifetime - Yes;Past 1 month - Yes   Non-Specific Active Suicidal Thoughts Lifetime - No;Past 1 month - No   Suicidal Behavior Trigger Wish to be Dead   Intensity of Ideation   Lifetime - Most Severe Ideation 5 - most severe   Lifetime - Most Recent Ideation 3   Frequency Daily or almost daily   Duration 1-4 hours/a lot of time   Controllability Can control thoughts with a lot of difficulty   Deterrents Deterrents most likely did not stop you   Reasons for Ideation Mostly to end or stop the pain   Suicidal Behavior   Actual Attempt Lifetime - Yes;Past 3 months - No     ______________________________________________  Joesph Ghazi, MSW, Supervisee in Social Work  Cell: [phone]  Email: [email or web address]  12/19/23

## 2023-12-19 NOTE — Progress Notes (Signed)
 Laboratory Monitoring for Mood Stabilizers and Antipsychotics    Recommended baseline monitoring has been completed based on this patient's current medication regimen.     This patient is currently prescribed the following medication(s):   Current Facility-Administered Medications: citalopram (CELEXA) tablet 10 mg, 10 mg, Oral, Daily  divalproex (DEPAKOTE) DR tablet 500 mg, 500 mg, Oral, BID  PHENobarbital tablet 64.8 mg, 64.8 mg, Oral, 4x daily **FOLLOWED BY** [START ON 12/20/2023] PHENobarbital tablet 32.4 mg, 32.4 mg, Oral, 4x daily **FOLLOWED BY** [START ON 12/21/2023] PHENobarbital tablet 32.4 mg, 32.4 mg, Oral, BID **FOLLOWED BY** [START ON 12/22/2023] PHENobarbital tablet 16.2 mg, 16.2 mg, Oral, BID  PHENobarbital tablet 64.8 mg, 64.8 mg, Oral, Q6H PRN **FOLLOWED BY** [START ON 12/20/2023] PHENobarbital tablet 32.4 mg, 32.4 mg, Oral, Q6H PRN **FOLLOWED BY** [START ON 12/22/2023] PHENobarbital tablet 16.2 mg, 16.2 mg, Oral, Q6H PRN  acetaminophen (TYLENOL) tablet 650 mg, 650 mg, Oral, Q4H PRN  polyethylene glycol (GLYCOLAX) packet 17 g, 17 g, Oral, Daily PRN  aluminum & magnesium hydroxide-simethicone (MAALOX PLUS) 200-200-20 MG/5ML suspension 30 mL, 30 mL, Oral, Q6H PRN  hydrOXYzine HCl (ATARAX) tablet 50 mg, 50 mg, Oral, TID PRN  haloperidol (HALDOL) tablet 5 mg, 5 mg, Oral, Q4H PRN **OR** haloperidol lactate (HALDOL) injection 5 mg, 5 mg, IntraMUSCular, Q4H PRN  diphenhydrAMINE (BENADRYL) injection 50 mg, 50 mg, IntraMUSCular, Q4H PRN  traZODone (DESYREL) tablet 50 mg, 50 mg, Oral, Nightly PRN  thiamine tablet 100 mg, 100 mg, Oral, Daily  folic acid (FOLVITE) tablet 1 mg, 1 mg, Oral, Daily  multivitamin 1 tablet, 1 tablet, Oral, Daily      The following labs have been completed for monitoring of antipsychotics and/or mood stabilizers:    Height, Weight, BMI Estimation  Estimated body mass index is 25.64 kg/m as calculated from the following:    Height as of this encounter: 1.727 m (5' 8).    Weight as of this  encounter: 76.5 kg (168 lb 10.4 oz).     Vital Signs/Blood Pressure  BP 136/69   Pulse 79   Temp 98.3 F (36.8 C) (Oral)   Resp 18   Ht 1.727 m (5' 8)   Wt 76.5 kg (168 lb 10.4 oz)   SpO2 97%   BMI 25.64 kg/m     Renal Function, Hepatic Function and Chemistry  Estimated Creatinine Clearance: 110 mL/min (based on SCr of 0.7 mg/dL).    Lab Results   Component Value Date/Time    NA 140 12/17/2023 04:56 PM    K 3.9 12/17/2023 04:56 PM    CL 99 12/17/2023 04:56 PM    CO2 25 12/17/2023 04:56 PM    ANIONGAP 16 12/17/2023 04:56 PM    GLUCOSE 135 12/17/2023 04:56 PM    BUN 8 12/17/2023 04:56 PM    CREATININE 0.70 12/17/2023 04:56 PM    BILITOT 0.3 12/17/2023 04:56 PM    GLOB 3.2 12/17/2023 04:56 PM    ALT 81 12/17/2023 04:56 PM    AST 55 12/17/2023 04:56 PM    ALKPHOS 90 12/17/2023 04:56 PM    ALKPHOS 120 02/18/2021 02:46 PM       Glucose/Hemoglobin A1c  See result from Feb 2025 in Care Everywhere    Hematology  Lab Results   Component Value Date/Time    WBC 9.7 12/17/2023 04:56 PM    RBC 4.32 12/17/2023 04:56 PM    HGB 14.4 12/17/2023 04:56 PM    HCT 40.8 12/17/2023 04:56 PM    MCV 94.4 12/17/2023  04:56 PM    MCH 33.3 12/17/2023 04:56 PM    MCHC 35.3 12/17/2023 04:56 PM    RDW 14.4 12/17/2023 04:56 PM    PLT 293 12/17/2023 04:56 PM       Lipids  See result from Feb 2025 in Care Everywhere    Thyroid Function  See result from May 2025 in Care Everywhere    Rumalda Lacy, PharmD, BCPS, [facility]  Clinical Pharmacy Specialist, Behavioral Health  Zone phone: [phone]  Pharmacy: [phone])

## 2023-12-19 NOTE — Care Coordination-Inpatient (Signed)
 12/19/23 0842   ITP   Date of Plan 12/19/23   Date of Next Review 12/23/23   Primary Diagnosis Code Mood disorder   Barriers to Treatment Need for psychoeducation   Strengths Incorporated in Plan Acknowledging need for assistance;Verbal   Plan of Care   Long Term Goal (LTG) Stated in patient/guardian terms to have better coping skills   Short Term Goal 1   Short Term Goal 1 Client will learn and demonstrate effective coping skills   Baseline Functioning Client reports passive SI, increased depression and anxiety   Target Client will feel confident using healthy coping skills   Objectives Other (comment)   Intervention  Group therapy   Frequency Daily to learn, practice, and implement coping skills   Measured by Staff observation;Self report   Staff Responsible Clinical staff;[facility] staff   Intervention 2 Milieu therapy and support   Frequency Daily for peer support and socialization   Measured by Staff observation;Self report   Staff Responsible Clinical staff;[facility] staff   STG Goal 1 Status: Patient Appears to be  Progressing toward treatment plan goal   Short Term Goal 2   Short Term Goal 2 Client will maintain compliance with medication regime   Baseline Functioning Client reports unmanageable anxiety and depression   Target Client will feel supported by and comply with medication regimen   Objectives Other (comment)   Intervention  Monitor medications   Frequency Daily to monitr for side effects and efficacy   Measured by Staff observation;Self report   Staff Responsible Clinical staff;[facility] staff   Intervention 2 Referral to community services   Frequency At discharge for continuity of care   Measured by Staff observation   Staff Responsible Clinical staff;[facility] staff   STG Goal 2 Status: Patient Appears to be  Progressing toward treatment plan goal   Crisis/Safety/Discharge Plan   Crisis/Safety Plan Standard program interventions and protocol   Comprehensive Assessment Completion Date 12/19/23      ______________________________________________  Joesph Ghazi, MSW, Supervisee in Social Work  Cell: [phone]  Email: [email or web address]  12/19/23

## 2023-12-19 NOTE — Behavioral Health Treatment Team (Signed)
 Writer encountered patient in room. Patient was sleeping and appeared calm/free from distress. Patient was cooperative with assessment/vitals. Patient is A&Ox4. Patient presents with a restricted affect and an anxious mood. Patient endorses anxiety. Patient rates anxiety a 6/10. Patient denies depression, SI, HI, and AVH. Patient is compliant with scheduled medications. CIWA upon morning assessment was 11, PRN Ativan given per MAR. Patient is isolative to self. Patient appearance is disheveled. Q15 minute safety checks maintained.

## 2023-12-19 NOTE — Group Note (Signed)
 Group Therapy Note     Date: 12/19/23     Group Start Time: 2:00PM    Group End Time: 3:00PM    Group Topic: Processing Group: Identifying Feelings    Unit: [facility] Acute BH Unit    Number of Participants: 0     Census: 14    Focus of today: Emotional Myths: Clinical research associate provided handout from [employer] that goes over emotional myths vs emotional facts. Writer encouraged patients to discuss their emotions as well as beliefs they hold about emotions and whether or not they align with their values. Group participants were encouraged to discuss the role emotions play for them and what emotions may feel uncomfortable    Patient's Goal/Task: The goal of the group is to provide patients with a supportive and safe space to openly discuss their concerns related to treatment, current challenges, and personal experiences. This group setting encourages peer support, where patients can share their perspectives and insights, helping each other in their recovery process. The group also serves as a platform for discussing what's on their minds, allowing individuals to express their emotions and thoughts freely.  The purpose of this group is to use the recovery model for mental health to allow patients to gain the tools to foster success in the community. Evidence indicates that patients who participate in group therapy in combination with medication have better outcomes. The following SAMSHA principles of recovery are guided by the following framework; recovery is: person driven, occurs through many pathways, is holistic, is supported by peers and allies, is supported through relationships and social networks, is culturally based and influenced, uses a strengths based perspective, is based on respect and emerges from hope.     Individual participation: Clinical research associate invited patient to attend, the patient declined.       Name and Credentials: Vernell Admire, MSW, Supervisee in Social Work    Supervisor: Cy Boas, LCSW

## 2023-12-19 NOTE — Group Note (Signed)
 Group Therapy Note    Date: 12/19/2023    Group Start Time: 1500  Group End Time: 1600  Group Topic: Recreational    [facility]    Cherice Glennie S        Group Therapy Note    Attendees: 6       Patient's Goal:  To concentrate on selected task    Notes:  Pt did not attend session  Discipline Responsible: Recreational Therapist      Signature:  Kamyia Thomason S Raiford Fetterman

## 2023-12-19 NOTE — Plan of Care (Signed)
 Problem: Self Harm/Suicidality  Goal: Will have no self-injury during hospital stay  Description: INTERVENTIONS:  1.  Ensure constant observer at bedside with Q15M safety checks  2.  Maintain a safe environment  3.  Secure patient belongings  4.  Ensure family/visitors adhere to safety recommendations  5.  Ensure safety tray has been added to patient's diet order  6.  Every shift and PRN: Re-assess suicidal risk via Frequent Screener    Outcome: Progressing

## 2023-12-19 NOTE — Group Note (Signed)
 Group Therapy Note    Date: 12/19/2023    Group Start Time: 1100  Group End Time: 1200  Group Topic: Relaxation    [facility]    Leisl Spurrier S        Group Therapy Note    Attendees: 5       Patient's Goal:  To participate in relaxation activity    Notes:  Pt did not attend session  Discipline Responsible: Recreational Therapist      Signature:  Rocco Kerkhoff S Jadi Deyarmin

## 2023-12-19 NOTE — Progress Notes (Signed)
 Auto-MST trigger per RN admission assessment.  No acute nutrition or weight issues identified.  Pt meal compliant. Supplement ordered.  Hx notable for ongoing ETOH and crack cocaine abuse.  Pt reports sig wt loss but per EMR pt's wt is stable.  Cont thiamine, folate and MV as ordered  Ht: 5'8  Wt: 168 lb 10.4 oz  BMI: 25.64 kg/(m^2) c/w overweight  Est energy needs: 2100 kcal, 76 g protein, 1 mL/kcal fluids  Pt will consume > 75% of meals at follow up 7-10 days  LOS, MST

## 2023-12-19 NOTE — Plan of Care (Signed)
 Problem: Seizure Precautions  Goal: Remains free of injury related to seizures activity  Outcome: Progressing     Problem: Anxiety  Goal: Will report anxiety at manageable levels  Description: INTERVENTIONS:  1. Administer medication as ordered  2. Teach and rehearse alternative coping skills  3. Provide emotional support with 1:1 interaction with staff  Outcome: Progressing

## 2023-12-19 NOTE — Progress Notes (Addendum)
 Patient is received in the hallway. He is pleasant and cooperative. He has a flat affect. Reports his mood is Ok. He denies SI/HI/AVH currently. He rates his depression 8/10 and anxiety 7/10. No c/o pain or discomfort verbalized.   Patient's CIWA score is a 3 at 2027. He has no other voiced concerns at this time. Staff will continue to monitor for safety, location, and behavior q 15 minutes.       0600  No acute changes overnight. Patient slept for 8 hours this shift.

## 2023-12-19 NOTE — Group Note (Signed)
 Group Therapy Note     Date: 12/19/23     Group Start Time: 1015    Group End Time: 11:00    Group Topic: Psychoeducation: Mental Health 101     Unit: [facility] Acute BH Unit    Number of Participants: 3     Census: 15    Patient's Goal/Task: The goal of the group is to provide patients with psycho-education about different behavioral health topics. The purpose of this group is to use the recovery model for mental health to allow patients to gain the tools to foster success in the community. Evidence indicates that patients who participate in group therapy in combination with medication have better outcomes. The following SAMSHA principles of recovery are guided by the following framework; recovery is: person driven, occurs through many pathways, is holistic, is supported by peers and allies, is supported through relationships and social networks, is culturally based and influenced, uses a strengths based perspective, is based on respect and emerges from hope. Additionally, the group integrates treatment planning by addressing relevant questions, providing feedback on progress, and facilitating a collaborative approach to patient care. Ultimately, the group fosters a sense of connection and empowerment, aiding in both personal reflection and treatment development.     Focus of Today: Today's group focuses specifically Maslow's Hierarchy of Needs: The focus of this group is to provide psychoeducation on Maslow's hierarchy of needs. The goal of this group is to discuss the complexity of needs and basic needs vs psychological needs vs fulfillment needs. Writer provided handout from therapistaid.com for visual aid of Maslow's Hierachy. During group the participants were encouraged to identify which needs are being met and which are not being met.     Individual Participation Level:  Writer invited patient to attend, the patient declined.      Name and Credentials: Bradley [name], MSW, Supervisee in Social  Work    Supervisor: Cy Boas, LCSW

## 2023-12-19 NOTE — Progress Notes (Signed)
 [facility] Admission Medication History    Information obtained from: Patient interview (limited), chart review  Dispense history available1: No    Comments/recommendations:  Medication list below updated based on [facility] records from Care Everywhere.  Patient discharged from one of their facilities ~1 week ago.     Medication changes (since last review):  Added  Citalopram  Divalproex  Melatonin  Hydroxyzine  Nicotine patch   Not Taking  Ibuprofen   Adjusted  None       1This data captures prescription medications filled and processed through the patient's insurance. Certain dispenses may not be available or accurate, including over-the-counter medications, low cost prescriptions, prescriptions paid for by the patient or non-participating sources, or errors in insurance claims information.   It does NOT capture whether the medication was picked up or is currently being taken by the patient.        Patient allergies:   Allergies as of 12/18/2023    (No Known Allergies)       Prior to Admission Medications:  Prior to Admission Medications   Prescriptions Last Dose Informant   citalopram (CELEXA) 10 MG tablet Past Week Outside Pharmacy/PCP   Sig: Take 1 tablet by mouth daily   divalproex (DEPAKOTE) 250 MG DR tablet Past Week Outside Pharmacy/PCP   Sig: Take 1 tablet by mouth in the morning and at bedtime   hydrOXYzine HCl (ATARAX) 50 MG tablet Past Week Outside Pharmacy/PCP   Sig: Take 1 tablet by mouth every 6 hours as needed for Anxiety   melatonin 3 MG TABS tablet Past Week Outside Pharmacy/PCP   Sig: Take 2 tablets by mouth nightly   nicotine (NICODERM CQ) 21 MG/24HR Past Week Outside Pharmacy/PCP   Sig: Place 1 patch onto the skin in the morning.      Facility-Administered Medications: None          Thank you,  Rumalda Lacy, PharmD, BCPS, [facility]  Clinical Pharmacy Specialist, Behavioral Health  Zone phone: [phone]  Pharmacy: [phone])

## 2023-12-19 NOTE — H&P (Signed)
 [facility]               [address] [place], [place]  [address]                                [facility] H&P      PATIENT NAME: Bradley [name], Bradley [name]             DOB: 12-27-1965  MED REC NO: [id number]                       ROOM: 310  ACCOUNT NO: [id number]                       ADMIT DATE: 12/18/2023  PROVIDER: Irma CHRISTELLA Lash, MD      CHIEF COMPLAINT:  I don't feel so good.    HISTORY OF PRESENT ILLNESS:  The patient is a 58 year old Caucasian man, who is currently admitted after being transferred to us  from [facility].  He had reported there with a history of having expressed suicidal ideation and depressed mood.  He reports that he has been drinking quite heavily and in the past week, he thinks he had almost 36 beers every day.  He states he has had one withdrawal seizure in the past, although he cannot remember when this was.  States that he was discharged from [facility] about a week ago and did not take any of his medications including Depakote and Celexa.  He reports that he was feeling depressed, hopeless and having thoughts of cutting himself with a knife.  He also uses cocaine, but states he uses it rarely and last used it 3 days ago.  Urine drug screen was negative and blood alcohol level was 330.  Denies use of any other recreational substances.  States that he is feeling depressed and hopeless today and also feels paranoid that somebody is watching him.  Nursing staff reports that he has been somewhat irritable since his arrival on the milieu, but has not been agitated or aggressive.  He was calm and cooperative during the interview.    PAST MEDICAL HISTORY:  Reviewed as per the history and physical exam.      Past Medical History:   Diagnosis Date    Abuse, drug or alcohol (HCC)     alcohol abuse     Prior to Admission medications   Medication Sig Start Date End Date Taking? Authorizing Provider   citalopram (CELEXA) 10 MG tablet Take 1 tablet by mouth daily  12/11/23  Yes [provider]   hydrOXYzine HCl (ATARAX) 50 MG tablet Take 1 tablet by mouth every 6 hours as needed for Anxiety 12/11/23  Yes [provider]   melatonin 3 MG TABS tablet Take 2 tablets by mouth nightly 12/11/23  Yes [provider]   nicotine (NICODERM CQ) 21 MG/24HR Place 1 patch onto the skin in the morning. 12/11/23  Yes [provider]   divalproex (DEPAKOTE) 250 MG DR tablet Take 1 tablet by mouth in the morning and at bedtime 12/11/23  Yes [provider]   ibuprofen  (ADVIL ;MOTRIN ) 600 MG tablet Take 1 tablet by mouth every 6 hours as needed for Pain or Fever  Patient not taking: Reported on 12/19/2023 11/02/23   Caye Toribio LABOR, MD     @DCDT @  Lab Results   Component Value Date  WBC 9.7 12/17/2023    HGB 14.4 12/17/2023    HCT 40.8 12/17/2023    MCV 94.4 12/17/2023    PLT 293 12/17/2023     Lab Results   Component Value Date    NA 140 12/17/2023    K 3.9 12/17/2023    CL 99 12/17/2023    CO2 25 12/17/2023    BUN 8 12/17/2023    CREATININE 0.70 12/17/2023    GLUCOSE 135 (H) 12/17/2023    CALCIUM 8.5 (L) 12/17/2023    BILITOT 0.3 12/17/2023    ALKPHOS 90 12/17/2023    AST 55 (H) 12/17/2023    ALT 81 (H) 12/17/2023    LABGLOM >90 12/17/2023    AGRATIO 0.8 (L) 02/18/2021    GLOB 3.2 12/17/2023         No results found for: VALAC, VALP, CARB2  No results found for: LITHM  RADIOLOGY REPORTS:(reviewed/updated 12/20/2023)  @RISRSLTEXT @  @lasthcg @      PAST PSYCHIATRIC HISTORY:  The patient reports that he has been in psychiatric treatment for many years and was just discharged from [facility] a few days ago.  He reports that he has attempted suicide in the past and has been drinking alcohol heavily for about 40 years now.  There appears to be a long history of poor compliance and followup with outpatient treatment.    PSYCHOSOCIAL HISTORY:  The patient had been living with a family member in [place] until recently, but thinks he cannot go back  to live there.  States that he has been homeless for several months now.  He is widowed, has 2 adult children with whom he has lost contact and is unemployed.  States that he has been getting social security disability for 9 years.  Denies any major legal stressors.    MENTAL STATUS EXAM:  The patient is a middle-aged Caucasian man, who is dressed in casual street clothes.  He is disheveled and has a long unkempt beard.  He makes limited eye contact.  Speech is spontaneous and coherent.  Psychomotor activity is decreased.  Mood is reported as hopeless and his affect is flat.  Passive suicidal ideation is present, but denies any active plan.  Denies any perceptual abnormalities.  Denies any auditory or visual hallucinations.  His thought process is logical and goal directed.  Cognitively, he is awake and alert, oriented to time, place, and person.  Intelligence is average.  Memory is intact and fund of knowledge is adequate.  Insight is poor.  Judgment is poor.    ASSESSMENT AND PLAN:  Diagnosis:  Mood disorder unspecified, rule out bipolar disorder; alcohol use disorder, severe; stimulant use disorder, moderate.    At the present time, we will continue his inpatient stay.  He will be provided with support and participate in the milieu.  We will detox him according to our protocol.  Estimated length of stay is 5-7 days.  His strengths include his ability to seek help and support from his therapeutic providers.        IRMA CHRISTELLA LASH, MD      ZMA/AQS  D:  12/19/2023 14:51:35  T:  12/19/2023 15:38:48  JOB #:  [id number]

## 2023-12-20 MED FILL — TRAZODONE HCL 50 MG PO TABS: 50 mg | ORAL | Qty: 1 | Fill #0

## 2023-12-20 MED FILL — PHENOBARBITAL 32.4 MG PO TABS: 32.4 mg | ORAL | Qty: 1 | Fill #0

## 2023-12-20 MED FILL — DIVALPROEX SODIUM 500 MG PO TBEC: 500 mg | ORAL | Qty: 1 | Fill #0

## 2023-12-20 MED FILL — PHENOBARBITAL 32.4 MG PO TABS: 32.4 mg | ORAL | Qty: 2 | Fill #0

## 2023-12-20 MED FILL — THIAMINE HCL 100 MG PO TABS: 100 mg | ORAL | Qty: 1 | Fill #0

## 2023-12-20 MED FILL — HYDROXYZINE HCL 25 MG PO TABS: 25 mg | ORAL | Qty: 2 | Fill #0

## 2023-12-20 MED FILL — FOLIC ACID 1 MG PO TABS: 1 mg | ORAL | Qty: 1 | Fill #0

## 2023-12-20 MED FILL — MULTIVITAMIN ADULT PO TABS: ORAL | Qty: 1 | Fill #0

## 2023-12-20 MED FILL — CITALOPRAM HYDROBROMIDE 20 MG PO TABS: 20 mg | ORAL | Qty: 1 | Fill #0

## 2023-12-20 NOTE — Plan of Care (Signed)
 Problem: Anxiety  Goal: Will report anxiety at manageable levels  Description: INTERVENTIONS:  1. Administer medication as ordered  2. Teach and rehearse alternative coping skills  3. Provide emotional support with 1:1 interaction with staff  12/20/2023 2243 by Ferris Kraft, RN  Outcome: Not Progressing  12/20/2023 1203 by Karoline, Sincere  Outcome: Progressing

## 2023-12-20 NOTE — Behavioral Health Treatment Team (Signed)
 Behavioral Health Interdisciplinary Rounds     Patient Name: Bradley [name] Age: 58 y.o. Room/Bed:  310/01  Primary Diagnosis: Mood disorder  Admission Status: Voluntary     Readmission within 30 days: No  Power of Attorney in place: No  Patient requires a blocked bed: No          Reason for blocked bed: n/a  _______________________________________________________________________________________  Sleep hours: 8       Participation in Care/Groups:  No  Medication Compliant?: Yes  PRNS (last 24 hours): Antianxiety and Sleep Aid    Restraints (last 24 hours):  No  Substance Abuse:  Yes    24 hour chart check complete: Yes  _______________________________________________________________________________________  Patient goal(s) for today: Take medications as prescribed, , engage in unit activities, participate in hygiene/ADLs, and communicate needs to appropriate staff  Treatment team focus/goals: MD adjust medications as appropriate, identify discharge planning needs, coordination of care  _______________________________________________________________________________________  Progress note: Patient presents ao x 4 They do appear their stated age. The patient appearance is unkept. The patient presents Cooperative, Withdrawn/Quiet, and Guarded. They do appear to be attending to ADLs evidenced by changing clothes. Their speech is soft. The patient's affect presents Flat, Blunted, Depressed, and Apathetic. Their affect does appear congruent with their content of speech. The patient does not appear to be responding to internal stimuli. The patient participated when directly confronted with treatment and discharge planning discussion. The patient presents with minimal participation in the milieu, evidenced by remaining isolative to room.      Discussion with patient: Pt denies HI/AVH. Reports passive thoughts of SI wishing he was dead. Pt asked for options regarding discharge placement once he is stable. CSU, RCSU, and  SAIOP options explained to the patient and he reports he's thought about it.    This Child psychotherapist: Provided education and Agenda-setting to identify pt's primary goals for [facility] visit / overall health    Observation: Mood/affect typical presentation for this patient    Treatment Response/Progress (A): some progress apparent    _______________________________________________________________________________________  Contact with family/outpatient supports: no    Social Work Plan: Make referral for/to: CSU or SAIOP once stable    LOS:  2  Expected LOS: 7-10    Insurance coverage: [place]  Family contact: None      Family requesting physician contact today:  No  Discharge plan: Home/Self Care.   Access to weapons: No       Outpatient provider(s): TBD  Patient's preferred phone number for follow up call: [phone]  _______________________________________________________________________________________    Participating treatment team members: Michelina JULIANNA Burdock Ricci Joesph, MSW, Supervisee in Social Work (Child psychotherapist); Woessner, Rose Salzberg, [facility] (unit pharmacist); Ali, Zaffar, MD (attending physician)  _______________________________________________________________________________________  Joesph Ricci, MSW, Supervisee in Social Work  [phone]  [email or web address]  12/20/23

## 2023-12-20 NOTE — Behavioral Health Treatment Team (Signed)
(  0700-1900) Shift Assessment     Upon meeting patient, patient roaming in room. Patient denied SI,HI and AVH. Patient denied Anxiety and endorsed depression at a 7/10. Patient stated I have always had some depression, even back when I had everything, a wife and everything. Patient denied Pain. Patient is calm and cooperative. Patient scored No Risk on the C-SSRS frequent screening. Patients facial expression is Sad.  Patients gait is steady. Patient is friendly and pleasant. Patient is meal and mediation compliant. Patient is Ax0x4. Patients eye contact is good.     Q15 checks in place to ensure patient safety.   Plan of care is ongoing.

## 2023-12-20 NOTE — Progress Notes (Signed)
 PSYCHIATRIC PROGRESS NOTE    Chief Complaint:  Things are slightly better.     Length of Stay: 2 Days    Interval History:  9/30 - Mr. Cullinane slept 8 hours last night and is minimally better. He remains depressed and hopeless. Frequency of SI has reduced today. At the present time the patient KHAIRI GARMAN remains compliant with taking medications. Denies any adverse events from taking them and feels they have been beneficial. Denies any AH or VH.     Past Medical History:  Past Medical History:   Diagnosis Date    Abuse, drug or alcohol (HCC)     alcohol abuse          citalopram  10 mg Oral Daily    divalproex  500 mg Oral BID    PHENobarbital  64.8 mg Oral 4x daily    Followed by    PHENobarbital  32.4 mg Oral 4x daily    Followed by    NOREEN ON 12/21/2023] PHENobarbital  32.4 mg Oral BID    Followed by    NOREEN ON 12/22/2023] PHENobarbital  16.2 mg Oral BID    thiamine  100 mg Oral Daily    folic acid  1 mg Oral Daily    multivitamin  1 tablet Oral Daily       Labs:  Lab Results   Component Value Date/Time    WBC 9.7 12/17/2023 04:56 PM    HGB 14.4 12/17/2023 04:56 PM    HCT 40.8 12/17/2023 04:56 PM    PLT 293 12/17/2023 04:56 PM    MCV 94.4 12/17/2023 04:56 PM      Lab Results   Component Value Date/Time    NA 140 12/17/2023 04:56 PM    K 3.9 12/17/2023 04:56 PM    CL 99 12/17/2023 04:56 PM    CO2 25 12/17/2023 04:56 PM    BUN 8 12/17/2023 04:56 PM    GLOB 3.2 12/17/2023 04:56 PM    ALT 81 12/17/2023 04:56 PM          Vitals:    12/20/23 0800   BP: 130/83   Pulse: (!) 102   Resp: 17   Temp: 97.7 F (36.5 C)   SpO2: 99%        Physical Exam:  Body habitus: Body mass index is 25.64 kg/m.  Musculoskeletal system: normal gait  Tremor - neg  Cog wheeling - neg    Mental Status Exam:  CHRISTELLA is a 58 y.o. YO male dressed in hospital apparel. Fairly groomed.    Eye contact is fair  Psychomotor activity: WNL  Speech is spontaneous, with normal, volume, tone, rhythm  Thought process is logical and goal directed.   Mood  is reported as okay and Affect is flat  Thought content :        SI -             [x]       HI -             []        Delusions - []   Level of suicide risk is:        None -          []        Low   -          []   Moderate -    [x]                             High -            []   Perception: Denies AH or VH.   Cognition appears to be intact.   Insight is partial, judgment is poor.     Assessment and Plan:  FARRON WATROUS meets criteria for a diagnosis of Mood disorder unspecified, rule out bipolar disorder; alcohol use disorder, severe; stimulant use disorder, moderate.    Continue the medication regimen as prescribed   Disposition planning to continue.     A coordinated, multidisplinary treatment team round was conducted with the patient, nurses, pharmcist, Administrator present. Discussions held with case manager, and/or with family members; Complete current electronic health record for patient was reviewed in full including consultant notes, ancillary staff notes, nurses and tech notes, labs and vitals.    I certify that this patients inpatient psychiatric hospital services furnished since the previous certification were, and continue to be, required for treatment that could reasonably be expected to improve the patient's condition, or for diagnostic study, and that the patient continues to need, on a daily basis, active treatment furnished directly by or requiring the supervision of inpatient psychiatric facility personnel. In addition, the hospital records show that services furnished were intensive treatment services, admission or related services, or equivalent services.

## 2023-12-20 NOTE — Plan of Care (Signed)
 Problem: Seizure Precautions  Goal: Remains free of injury related to seizures activity  Outcome: Progressing     Problem: Self Harm/Suicidality  Goal: Will have no self-injury during hospital stay  Description: INTERVENTIONS:  1.  Ensure constant observer at bedside with Q15M safety checks  2.  Maintain a safe environment  3.  Secure patient belongings  4.  Ensure family/visitors adhere to safety recommendations  5.  Ensure safety tray has been added to patient's diet order  6.  Every shift and PRN: Re-assess suicidal risk via Frequent Screener    12/20/2023 1203 by Karoline, Sincere  Outcome: Progressing  12/19/2023 2217 by Burruss, Laymon SAILOR, RN  Outcome: Progressing     Problem: Anxiety  Goal: Will report anxiety at manageable levels  Description: INTERVENTIONS:  1. Administer medication as ordered  2. Teach and rehearse alternative coping skills  3. Provide emotional support with 1:1 interaction with staff  Outcome: Progressing

## 2023-12-20 NOTE — Group Note (Signed)
 Group Therapy Note     Date: 12/20/23     Group Start Time: 1015    Group End Time: 11:00    Group Topic: Psychoeducation: What is CBT?    Unit: [facility] Acute BH Unit    Number of Participants:  2     Census: 14    Patient's Goal/Task: The goal of the group is to provide patients with psycho-education about different behavioral health topics. The purpose of this group is to use the recovery model for mental health to allow patients to gain the tools to foster success in the community. Evidence indicates that patients who participate in group therapy in combination with medication have better outcomes. The following SAMSHA principles of recovery are guided by the following framework; recovery is: person driven, occurs through many pathways, is holistic, is supported by peers and allies, is supported through relationships and social networks, is culturally based and influenced, uses a strengths based perspective, is based on respect and emerges from hope. Additionally, the group integrates treatment planning by addressing relevant questions, providing feedback on progress, and facilitating a collaborative approach to patient care. Ultimately, the group fosters a sense of connection and empowerment, aiding in both personal reflection and treatment development.    Focus of Today: The focus of group today is to provide psycho education on Cognitive Behavioral Therapy. Writer provided CBT model psychoeducational handout from therapistaid.com to discuss the ways thoughts impact emotions and behaviors. This social worker went over worksheet including example of how situations can trigger thoughts which leads to emotions that lead to behaviors. Patients were encouraged to discuss their thoughts on the model, ask questions and share their own experiences with CBT.     Individual Participation Level:  Writer invited patient to attend, the patient declined.     Name and Credentials: Bradley [name], MSW, Supervisee in Social  Work    Supervisor: Cy Boas, LCSW

## 2023-12-20 NOTE — Group Note (Signed)
 Group Therapy Note     Date: 12/20/23     Group Start Time: 2:00PM    Group End Time: 3:00PM    Group Topic: Therapeutic Activity     Unit: [facility] Acute BH Unit    Number of Participants: 3     Census: 12    Patient's Goal/Task: The goal of the group is to provide patients with a therapeutic activity that can be completed in the early stages of treatment while symptom burden remains high. Evidence indicates that patients who participate in group therapy in combination with medication have better outcomes. The following SAMSHA principles of recovery are guided by the following framework; recovery is: person driven, occurs through many pathways, is holistic, is supported by peers and allies, is supported through relationships and social networks, is culturally based and influenced, uses a strengths based perspective, is based on respect and emerges from hope. Additionally, the group integrates treatment planning by addressing relevant questions, providing feedback on progress, and facilitating a collaborative approach to patient care. Ultimately, the group fosters a sense of connection and empowerment, aiding in both personal reflection and treatment development.     Focus of Today: Today's group focuses specifically Coloring Mantras: This Child psychotherapist provided coloring sheets of positive mantras. Writer encouraged patients to think about how they can use positive mantras to reduce distress. Writer encouraged patients to identify a positive mantra that they want to use    Indivdual Participation Level:  Writer invited patient to attend, the patient declined.        Name and Credentials: Vernell Admire, MSW, Supervisee in Social Work    Supervisor: Cy Boas, LCSW
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
## 2023-12-21 MED FILL — MULTIVITAMIN ADULT PO TABS: ORAL | Qty: 1 | Fill #0

## 2023-12-21 MED FILL — FOLIC ACID 1 MG PO TABS: 1 mg | ORAL | Qty: 1 | Fill #0

## 2023-12-21 MED FILL — DIVALPROEX SODIUM 500 MG PO TBEC: 500 mg | ORAL | Qty: 1 | Fill #0

## 2023-12-21 MED FILL — CITALOPRAM HYDROBROMIDE 20 MG PO TABS: 20 mg | ORAL | Qty: 1 | Fill #0

## 2023-12-21 MED FILL — TRAZODONE HCL 50 MG PO TABS: 50 mg | ORAL | Qty: 1 | Fill #0

## 2023-12-21 MED FILL — PHENOBARBITAL 32.4 MG PO TABS: 32.4 mg | ORAL | Qty: 1 | Fill #0

## 2023-12-21 MED FILL — THIAMINE HCL 100 MG PO TABS: 100 mg | ORAL | Qty: 1 | Fill #0

## 2023-12-21 NOTE — Discharge Instructions (Signed)
 DISCHARGE SUMMARY    NAME:Bradley [name]  DOB: August 30, 1965  MRN: [id number]    The patient Bradley [name] exhibits the ability to control behavior in a less restrictive environment.  Patient's level of functioning is improving.  No assaultive/destructive behavior has been observed for the past 24 hours.  No suicidal/homicidal threat or behavior has been observed for the past 24 hours.  There is no evidence of serious medication side effects.  Patient has not been in physical or protective restraints for at least the past 24 hours.    If weapons involved, how are they secured? None involved    Is patient aware of and in agreement with discharge plan? Yes    Arrangements for medication:  Prescriptions sent to pharmacy    Copy of discharge instructions to provider?:  Yes    Arrangements for transportation home:  Lyft    Keep all follow up appointments as scheduled, continue to take prescribed medications per physician instructions.  Mental health crisis number:  911 or your local mental health crisis line number at [phone]      Mental Health Emergency WARM LINE      [phone]-MHAV [phone])      M-F: 9am to 9pm      Sat & Sun: 5pm - 9pm  National suicide prevention lines:                             1-800-SUICIDE 671-289-8378)       1-800-273-TALK [phone])   24/7 Crisis Text Line:  Text HOME to [phone]          Depression: Social Support and Recovery (02:44)  Your health professional recommends that you watch this short online health video.  See how family and friends helped three people cope with depression.   Purpose: Uses three stories to demonstrate the importance of using social support to combat depression.  Goal: The user will understand the importance of social support in managing depression.    Watch: Scan the QR code or visit the link to view video       https://hwi.se/[email or web address]  Current as of: October 20, 2022  Content Version: 14.6   2024-2025 [place], [place].   Care instructions  adapted under license by [facility]. If you have questions about a medical condition or this instruction, always ask your healthcare professional. Romayne Alderman, [facility], disclaims any warranty or liability for your use of this information.        5 Ways to Cope When Things Feel Out of Control (02:15)  Your health professional recommends that you watch this short online health video.  Learn how to cope when things are out of your control.   Purpose: Apply strategies to help you cope when things feel out of control.  Goal: Apply strategies to help you cope when things feel out of control.    Watch: Scan the QR code or visit the link to view video       https://hwi.[email or web address]  Current as of: January 13, 2023  Content Version: 14.6   2024-2025 [place], [place].   Care instructions adapted under license by [facility]. If you have questions about a medical condition or this instruction, always ask your healthcare professional. Romayne Alderman, [facility], disclaims any warranty or liability for your use of this information.       Walking for Exercise: Care Instructions  Overview     Walking is one of the  easiest ways to get the exercise you need for good health. A brisk, 30-minute walk each day can help you feel better and have more energy. It can help you lower your risk of disease. Walking can help you keep your bones strong and your heart healthy.  Check with your doctor before you start a walking plan if you have heart problems, other health issues, or you have not been active in a long time. Follow your doctor's instructions for safe levels of exercise.  Follow-up care is a key part of your treatment and safety. Be sure to make and go to all appointments, and call your doctor if you are having problems. It's also a good idea to know your test results and keep a list of the medicines you take.  How can you care for yourself at home?  Getting started  Start slowly and set a short-term goal. For example,  walk for 5 or 10 minutes every day.  Bit by bit, increase the amount you walk every day. Try for at least 30 minutes on most days of the week. You also may want to swim, bike, or do other activities.  If finding enough time is a problem, it's fine to be active in shorter periods of time throughout your day.  To get the heart-healthy benefits of walking, you need to walk briskly enough to increase your heart rate and breathing, but not so fast that you can't talk comfortably.  Wear comfortable shoes that fit well and provide good support for your feet and ankles.  Staying with your plan  After you've made walking a habit, set a longer-term goal. You may want to set a goal of walking briskly for longer or walking farther. Experts say to do 2 hours (150 minutes) of moderate activity a week. A faster heartbeat is what defines moderate-level activity.  To stay motivated, walk with friends, coworkers, or pets.  Use a phone app, pedometer, or wearable device to track your steps each day. Set a goal to increase your steps. When you reach that goal, set a higher goal.  If the weather keeps you from walking outside, go for walks at the mall with a friend. Local schools and churches may have indoor gyms where you can walk.  Fitting a walk into your workday  Park several blocks away from work, or get off the bus a few stops early.  Use the stairs instead of the elevator, at least for a few floors.  Suggest holding meetings with colleagues during a walk inside or outside the building.  Use the restroom that is the farthest from your desk or workstation.  Use your morning and afternoon breaks to take quick 15 minutes walks.  Staying safe  Know your surroundings. Walk in a well-lighted, safe place. If it's dark, walk with a partner. Wear light-colored clothing. If you can, buy a vest or jacket that reflects light.  Carry a cell phone for emergencies.  Drink plenty of water. Take a water bottle with you when you walk. This is very  important if it is hot out.  Be careful not to slip on wet or icy ground. You can buy grippers for your shoes to help keep you from slipping.  Pay attention to your walking surface. Use sidewalks and paths.  If you have health issues such as asthma, COPD, or heart problems, or if you haven't been active for a long time, check with your doctor before you start a new activity.  Where can you learn more?  Go to RecruitSuit.ca and enter R159 to learn more about Walking for Exercise: Care Instructions.  Current as of: October 20, 2022  Content Version: 14.6   2024-2025 [place], [place].   Care instructions adapted under license by [facility]. If you have questions about a medical condition or this instruction, always ask your healthcare professional. Romayne Alderman, [facility], disclaims any warranty or liability for your use of this information.       Learning About Getting Help With Transportation  Transportation is how we get around every day. It includes buses, trains, bicycles, scooters, cars, and walking. When you don't have transportation that you can rely on, it's harder to earn money, buy food, visit friends, and go to doctor visits. And this can affect your health.    Many places deliver food and other items, sometimes for free or low cost. For local resources, talk to your doctor or a faith leader, or ask at a senior center.   You can also find help by going online to http://harris-peterson.info/ or 211.org or by calling 211.   Where can you get help?  The programs that offer help with transportation will depend on where you live. And you may need to give some information to help you qualify, such as your income, your age, or if you have refugee status. To find out more about a program, try looking it up online or asking at your [employer]. Here are some other tips and online resources.    Reduced fare programs.  They give low-cost bus or train passes to older people, people with disabilities,  and people with Medicaid. Ask your doctor about these programs. Or call your local public transit agency.     Non-emergency medical transportation.  It gives free rides to and from doctor visits. It's for people enrolled in Medicaid or certain private insurance plans. Call or go online to your state Medicaid agency or private insurance plan to learn more.     Virtual care.  If it's hard for you to travel to doctor visits, ask your doctor about virtual care. You use a computer, phone, or other device to talk to a doctor or nurse.     Free or reduced cost food delivery and rides.  The [employer], [place], and other programs will deliver food and give rides to work, [employer], and other places.  211.org (or call 211)     Medical sales representative.  Programs in your area may give free rides or deliver food for free. Try calling a local senior center or food bank to learn more.  BaseballBoycott.ch  BargainWash.com.br     [employer].  It gives rides to veterans going to doctor visits.  CulturalLocator.com.cy     Electric bike and Audiological scientist.  There may be low-cost or free bike and scooter rentals in your area. The company [employer] offers a program in some cities.  li.me/why/community     Ride sharing apps.  If you don't mind sharing a ride with others, [employer] offers lower fares in some cities.  uber.com/us /en/ride/uberx-share     Carpooling.  Try searching online to see if there's a carpooling program in your area.   Current as of: October 20, 2022  Content Version: 14.6   2024-2025 [place], [place].   Care instructions adapted under license by [facility]. If you have questions about a medical condition or this instruction, always ask your healthcare professional. [place], [place],  disclaims any warranty or liability for your use of this information.     Learning About How to Make Food Go Further  Many people have small grocery budgets, and they may need help getting food at times. This  can make healthy eating harder to do. But there are ways to eat healthy foods and make the food go further. Planning meals and eating at home can help. So can buying food that's on sale and freezing it. You could also try looking in cookbooks or online for recipes and tips.    Worrying about getting food can be stressful. If you need help getting food, talk to your doctor, a Child psychotherapist, or a Artist. They can connect you to local resources.   You can also find help by going online to http://harris-peterson.info/ or to DifferentGeneration.co.uk or to 211.org or by calling 211.   How can you make food go further?  Healthy eating doesn't need to cost a lot. Here are some tips for making food go further.   Stretch recipes by adding healthy, low-cost foods. For example, add extra vegetables, lentils, beans, brown rice, or whole-grain pasta to your recipes. To stretch lean ground beef or malawi, mix in oats, beans, lentils, or grated vegetables.    Make some one-dish meals. Examples include chilis, soups, stews, and stir-fries. One-dish meals can be easy and fast to cook. And there may be less wasted food than with meals that have side dishes. And this can help save money.    Make a second meal from leftovers. Serve leftover chili, soup, or stew over a bowl of whole grains, like quinoa, brown rice, or bulgar wheat. Cut up leftover vegetables or cooked meats and add to salads, stir-fries, tacos, sandwiches, whole-grain pasta, or scrambled eggs.    Make some meals without meat. Try using lower-cost plant protein foods, like lentils, beans, or tofu, instead of meat in a recipe.    Try using frozen or canned fruits and vegetables. They are as healthy as fresh fruits and vegetables and can cost less. If you can, choose canned fruits and vegetables that don't have added sugar or salt.    Freeze foods that are close to the "use by" date. You can freeze most foods, including meat, milk, bread, cheese, and yogurt. Do not freeze unopened canned foods  or eggs in shells.    Make a weekly meal plan. Making meals at home can save you money and help you eat healthier foods. And planning ahead can save trips to the grocery store and help you buy only what you need.    Plan meals around the food you have. Look in your cupboards or refrigerator to see what you have. Then buy only the ingredients you need.    Try food sharing with friends, family members, or neighbors. For example, buy foods in bulk, divide them up, and split the cost. Cook weekly meals together, and divide the leftovers. Share fruits and vegetables from home gardens.    Go online to find recipes. You could try searching for "healthy meals on a budget" or "cheap and healthy recipes." Visit snaped.BaseballBoycott.ch for recipes and other resources. You could also check out cookbooks from [employer].    Ask for support, if needed. If you have a condition that affects your diet, such as diabetes, talk to your doctor or a registered dietitian. They can help you find resources.   Current as of: December 27, 2022  Content Version: 14.6  2024-2025 [employer].   Care instructions adapted under license by [facility]. If you have questions about a medical condition or this instruction, always ask your healthcare professional. Romayne Alderman, [facility], disclaims any warranty or liability for your use of this information.      5 Ways to Create Financial Stability (02:24)  Your health professional recommends that you watch this short online health video.  Learn about some things you can do to create financial stability.   Purpose: Use tips to create financial stability.  Goal: Use tips to create financial stability.    Watch: Scan the QR code or visit the link to view video       https://hwi.[email or web address]  Current as of: September 20, 2023  Content Version: 14.6   2024-2025 [place], [place].   Care instructions adapted under license by [facility]. If you have questions about a medical  condition or this instruction, always ask your healthcare professional. Romayne Alderman, [facility], disclaims any warranty or liability for your use of this information.       Learning About Alcohol Use Disorder  What is alcohol use disorder?  Alcohol use disorder means that a person drinks alcohol even though it causes harm to themselves or others. It can range from mild to severe. The more symptoms of this disorder you have, the more severe it may be. People who have it may find it hard to control their use of alcohol.  People who have this disorder may argue with others about how much they're drinking. Their job may be affected because of drinking. They may drink when it's dangerous or illegal, such as when they drive. They also may have a strong need, or craving, to drink. They may feel like they must drink just to get by. Their drinking may increase their risk of getting hurt or being in a car crash.  Over time, drinking too much alcohol may cause health problems. These may include high blood pressure, liver problems, or problems with digestion.  What are the symptoms?  Maybe you've wondered about your alcohol habits or how to tell if your drinking is becoming a problem.  Here are some of the symptoms of alcohol use disorder. You may have it if you have two or more of the following symptoms:  You drink larger amounts of alcohol than you ever meant to. Or you've been drinking for a longer time than you ever meant to.  You can't cut down or control your use. Or you constantly wish you could cut down.  You spend a lot of time getting or drinking alcohol or recovering from its effects.  You have strong cravings for alcohol.  You can no longer do your main jobs at work, at school, or at home.  You keep drinking alcohol, even though your use hurts your relationships.  You have stopped doing important activities because of your alcohol use.  You drink alcohol in situations where doing so is dangerous.  You keep drinking  alcohol even though you know it's causing health problems.  You need more and more alcohol to get the same effect, or you get less effect from the same amount over time. This is called tolerance.  You have uncomfortable symptoms when you stop drinking alcohol or use less. This is called withdrawal.  Alcohol use disorder can range from mild to severe. The more symptoms you have, the more severe the disorder may be.  You might not realize that your drinking is a  problem. You might not drink large amounts when you drink. Or you might go for days or weeks between drinking episodes. But even if you don't drink very often, your drinking could still be harmful and put you at risk.  How is alcohol use disorder treated?  Getting help is up to you. But you don't have to do it alone. There are many people and kinds of treatments that can help.  Treatment for alcohol use disorder can include:  Group therapy, one or more types of counseling, and alcohol education.  Medicines that help to:  Reduce withdrawal symptoms and help you safely stop drinking.  Reduce cravings for alcohol.  Support groups. These groups include Alcoholics Anonymous and SMART Recovery (Self-Management and Recovery Training).  Some people are able to stop or cut back on drinking with help from a counselor. People who have moderate to severe alcohol use disorder may need medical treatment. They may need to stay in a hospital or treatment center.  You may have a treatment team to help you. This team may include a psychologist or psychiatrist, counselors, doctors, social workers, nurses, and a Sports coach. A case manager helps plan and manage your treatment.  Follow-up care is a key part of your treatment and safety. Be sure to make and go to all appointments, and call your doctor if you are having problems. It's also a good idea to know your test results and keep a list of the medicines you take.  Where can you learn more?  Go to  RecruitSuit.ca and enter H758 to learn more about Learning About Alcohol Use Disorder.  Current as of: November 09, 2022  Content Version: 14.6   2024-2025 [place], [place].   Care instructions adapted under license by [facility]. If you have questions about a medical condition or this instruction, always ask your healthcare professional. Romayne Alderman, [employer], disclaims any warranty or liability for your use of this information.       Deciding About Using Medicines To Quit Smoking  How can you decide about using medicines to quit smoking?  What are the medicines you can use?  Your doctor may prescribe varenicline (Chantix) or bupropion SR. These medicines can help you cope with cravings for tobacco. They are pills that don't contain nicotine.  You also can use nicotine replacement products. These do contain nicotine. There are many types.  Gum and lozenges slowly release nicotine into your mouth.  Patches stick to your skin. They slowly release nicotine into your bloodstream.  An inhaler has a holder that contains nicotine. You breathe in a puff of nicotine vapor through your mouth and throat.  Nasal spray releases a mist that contains nicotine.  What are key points about this decision?  Using medicines can increase your chances of quitting smoking. They can ease cravings and withdrawal symptoms.  Getting counseling along with using medicine can raise your chances of quitting even more.  These nicotine replacement products have less nicotine than cigarettes. And by itself, nicotine is not nearly as harmful as smoking. The tars, carbon monoxide, and other toxic chemicals in tobacco cause the harmful effects.  The side effects of nicotine replacement products depend on the type of product. For example, a patch can make your skin red and itchy. Medicines in pill form can make you sick to your stomach. They can also cause dry mouth and trouble sleeping. For most people, the side effects  are not bad enough to make them stop using the  products.  Why might you choose to use medicines to quit smoking?  You have tried on your own to stop smoking, but you were not able to stop.  You want to increase your chances of quitting smoking.  You want to reduce your cravings and withdrawal symptoms.  You feel the benefits of medicine outweigh the side effects.  Why might you choose not to use medicine?  You want to try quitting on your own by stopping all at once (cold malawi).  You want to cut back slowly on the number of cigarettes you smoke.  You do not like using medicine.  You feel the side effects of medicines outweigh the benefits.  You are worried about the cost of medicines.  Your decision  Thinking about the facts and your feelings can help you make a decision that is right for you. Be sure you understand the benefits and risks of your options, and think about what else you need to do before you make the decision.  Where can you learn more?  Go to RecruitSuit.ca and enter [phone] to learn more about Deciding About Using Medicines To Quit Smoking.  Current as of: November 09, 2022  Content Version: 14.6   2024-2025 [place], [place].   Care instructions adapted under license by [facility]. If you have questions about a medical condition or this instruction, always ask your healthcare professional. Romayne Alderman, [facility], disclaims any warranty or liability for your use of this information.      [employer]*  (Call 211 for additional resources)  [employer] System    What they offer: Provides public transportation to the [place] area and parts of [place] and [place] counties.  Website: https://glass.com/  Phone Number: [phone]  [facility] Specialized Services (CARE & CARE Plus)    What they offer: Provides public transportation access to individuals with disabilities who may not reasonably be able to use [employer] fixed route bus service.  Provides origin-to-destination services under the guidelines of the ADA.  Website:           BoogieMedia.com.au  Phone Number: [phone]  [facility] Ride Connection    What they offer: Ride Connection provides 2 round trip rides/month to non-emergency medical appointments (must meet eligibility requirements)  Website:           https://spancenter.org/service/friendship-cafes/   Phone Number: 778-781-4636  Eligibility Requirements: Individuals with disabilities (18+) or older adults (60+) and live in the [place] of [place] or the counties of [place], [place], [place], [place], [place], [place] and [place].  Sliding scale fee system. You must give 7 days' notice to schedules rides, which are subject to availability.  Let[employer] Go Services    What they offer: Donation based transportation services for veterans, families in need, the elderly, and those with disabilities.  Website: https://www.letsgoservices.org/  Phone Number: 248-228-5347. Please allow two weeks' notice in scheduling rides, which are subject to availability.  Additional Information: Offers transport to appointments, grocery store, airport, etc. in the areas of [address], [address], [place], [place], and [place].              Revised 07/2023 - *Eligibility and availability may vary.     The [facility] [place] and [place]    What they offer: Transportation for older adults.  Website: https://tscor.org  Phone Number: Request an application by calling [phone] [facility]) [phone] [facility]).  Additional Information: Complete and return application. Application must be on file to request a ride.  Request your  ride one week before your appointment  Rides to medical appointments are available once per week  Rides to the grocery store can be scheduled once every other week.  Unable to schedule both services (medical apt. and grocery store) within the same week  Rides are  available from 9:00AM-3:30PM, Monday-Friday     [employer] transit service serving [place], [place], [place], [place] and [place], [place], [place], [place], [place], [place], [place], and [place].  Website: KnotFinder.com.au  Phone: [phone]     North/Northeast    Foot Locker - Serves residents in [place], [place], [place], [place], [place], [place] and [place] counties.  Website:     AdminParking.ch.html  Phone: [phone]  [place] DASH - Transport services for residents of [facility] aged 60+ or those with a short term or long-term disability.  Website: AquariamTheater.at  Phone: [phone])  Additional Information: One-way rides are $5 - must book 24 hours in advance  Hours of operation: Monday - Sunday (6:00AM - 6PM)  [place] Senior Rides - Serves seniors age 31+ who are not able to drive. Transportation is for medical appointments, grocery shopping, or personal business (ex: banking). Seniors must be ambulatory. Areas: [place] [phone], [id number]) and [place] ([address], [address], [address], [address])  Website: https://www.GemMerchandise.ch  Phone: [place] area - [phone]; [place] area - [phone]       Central    GoochlandCares for Seniors - Transportation services for residents of [facility] who are 60+, disabled, or have low income (200% below Federal Poverty Level).  Website:           http://www.goochlandcares.org/get-help/transportation-services/  Call to schedule an appointment: [phone]  [facility]    Jacobs Engineering - Transportation services for residents of [facility] who are 60+, disabled, or meet income guidelines. The service area includes any location in [facility]. Rides are available to destinations outside the county for employment and medical purposes, including limited service to the  cities of [address], [place], [place] and [place]; the counties of [place], [place], [place], [place] and [place]; and [place]).  Website:  RecordDebt.hu  Phone: [phone]     CarMax - Public transportation and Allied Waste Industries in the NVR [employer] region for Health Net, businesses and visitors of [place], [place], [place] and the surrounding counties. [place] Area Transit is pleased to offer an express route to [place]. This program is specifically designed senior persons over 32 years of age and the disabled.  Phone: [phone] - regular business hours  Para-transit Phone: [phone]  Website: https://www.WindowModel.si  Hours of Operation: CarMax (PAT) buses operate Monday thru Friday from 5:45AM-6:15PM, and on Saturday from 7:15AM-6:15PM.  Additional information: Fare Free Service: CarMax is providing fare free service until further notice. This includes all fixed route and para-transit services.       Medicaid Plans - Cardinal Care    Each health plan has options for transportation services. Contact your insurance provider to schedule transportation. Conservator, museum/gallery information is listed on the back of the insurance card.     Engineer, structural Health of [place]   Phone: [phone] Website: aetnabetterhealth.com/[place]   Anthem HealthKeepers Plus  Phone: [phone] Website: https://aguirre-king.net/  AT&T Complete Care  Phone: [phone] Website: MCCofVA.com  [facility] Plans (Formerly [place])  Phone: [phone] Website: sentarahealthplans.com  Kohl's: [phone] Website: uhccp.com/[place]      Medicare Advantage Plans  Some plans may provide transportation. Members should contact the Member Services or Transportation  number on the back of their insurance card for more information or to schedule transportation.           All stretcher and wheelchair transportation services are private pay  Golden West Financial  Phone: [phone]  Website: https://raaems.org/  American Medical Response  Phone: [phone]  Website: ShoppingDebt.is  Delta Response  Phone: [phone]  Website: MVPSpecials.it     Wheelchair Teacher, English as a foreign language Medical Response - Advance Life Support, Basic Life Support, Control and instrumentation engineer, Doctor, general practice, Biomedical scientist, and Fish farm manager.  Phone: [phone]  Website: ShoppingDebt.is  Hospital to Home - Advance Life Support, Basic Life Support, Critical Care Transport, Doctor, general practice, Wheelchair, Ambulatory, and Entergy Corporation transport.  Phone: [phone]  Website: http://olson.com/  Quarry manager and wheelchair transportation. Home accommodations, and mobility solutions such as stair lifts, ramps and platform lifts.  Phone: [phone]  Website: MiracleSpeed.pl  Darden Restaurants*  (Call 211 for more resources)     Homeless Connection Line (Local)    What they offer: The line facilitates access to resources and shelter alternatives for those who are currently homeless or 3 days or less away from losing their housing. They also provide information for the inclement weather shelters when available.  Areas served: [place], [address], [place], [place], [place], [place], [place],  [place], and the [place] of [place]ry  Website: vSpecials.com.pt  Phone Number: [phone] **Contact this number first. It is a centralized point of entry for services.  Hours of Operation: Monday - Friday 8AM - 12PM & 1PM-9PM; Saturday & Sunday 1PM - 9PM  Better Housing Coalition    What they offer: Assistance finding affordable, high quality housing for those who qualify.  Address:  [address], [place], [place] [address]  Website: https://www.betterhousingcoalition.org/  Phone Number: [phone]  Hours of Operation: Monday - Friday 8:30AM - 5PM  Senior Apartments through [facility]: https://www.betterhousingcoalition.org/find-housing/senior-communities/  [facility] & Homeless Services Drum[place])    What they offer: The [facility] serves as the coordinated point of entry for households experiencing a housing crisis in the [place] area. It provides referral and community resources, homelessness prevention, and rapid rehousing services.  Areas served: Cities of [address], [place], and [place]); [place] of [place], [place], [place], [place], [place], and [place].  Phone Number: [phone], dial 0 to leave a VM and request a return call  Homeward    What they offer: Homeward is the regional planning and coordinating agency for homeless services.  Website:          TalkMeditation.is  Greater Freescale Semiconductor - Brewing technologist are a Architect for those seeking resources. These sheets are located on the [facility] home page.  Phone Number: [phone]    Housing Resource Line    What they offer: Centralized line to help residents connect to programs and services that will help address their housing needs.  Needs addressed by HRL: Financial Assistance, Financial Education, Colgate Palmolive Education, Emergency Assistance, Hospital doctor, Radiation protection practitioner, Locating Rental Options, Rehabs & Repairs, Ramps  Serve residents of [place], [place], [place], [place], [place], [place], [place], the [place] of [place], and the [place] of [place].  Website:           BarRewards.com.pt  Phone Number: [phone]  You will be asked to leave a voicemail and will receive a return call in 5  business days.  Hours of Operation: Monday-Friday, 8:30 AM-4:30  PM  Department of [employer]PER[employer] Homeless - Jacobs Engineering    What they offer: Free 24/7 access to trained counselors for local resources and assistance  Website: http://pittman-dennis.biz/.asp  Phone Number: [phone] (text messaging accepted)     Medicaid Care Coordination    What they offer: Housing assistance resource for renters, homeowners, and those who are homeless.  Contact Information: Members should contact the member services number on the back of their insurance card to connect with a "housing specialist and/or care coordinator."  American International Group and Housing Authority (RRHA)    What they offer: RRHA provides homes for low-income families, seniors, and the disabled.  Website: https://www.hopkins-lewis.org/  Phone Number: 504-704-9270  Address: [address] [place], [place] [address] - Floors 4 & 5  U.S. Department of Housing and Teaching laboratory technician (HUD)    What they offer: Public housing assistance  Website: SystemStores.si  Additional Information: Select your state from the website to find location specific contact information.       [place]  Housing  What they offer: Education resources for Assurant, homeowners, and renters.  Website:          https://www.virginiahousing.com/individuals-families  Phone Number: (301)862-2209  Virginiahousingsearch.com    What they offer: Free resource to help find a home that fits your needs and budget. Property providers can list apartments or homes for rent at any time.  Website: SteelFirm.ca  Phone Number: ([phone], Monday - Friday 9:00 AM - 8:00 PM    Johnson Controls*  (Call 211 for more resources)  Corning Incorporated (formerly Sales executive)    What they offer: SNAP is used like cash to buy eligible food items from authorized retailers.  Apply for benefits online: https://molina.com/.[place] .gov/  Apply for benefits by phone: [phone] (M-F 8:00AM -  7:00PM; Sat 9:00AM - 12:00PM)  Feed More Help Hotline    What they offer: The [facility] Hunger Hotline connects individuals in need of food with a local food pantry or program across 34 counties and cities in [place] .  Website: https://feedmore.org/get-help/   Feed More Help Line: https://feedmore.org/help-line/     Feed More Help Line Phone Number: [phone] (M-F 9:00AM-4:00PM)  Meals on Wheels    Meals on Wheels is a program that delivers meals to individuals who have no reliable means for maintaining a healthy diet. Services are provided by area.  Tyson Foods Service Area: Cities of [place], [place], [place], and [place]. Counties of [place], [place], [place], [place], [place], [place], [place], [place], [place], and Wm. Wrigley Jr[employer] Company  Website:  https://feedmore.org/how-we-help/meals-on-wheels/  To Apply:  Ages 52-59:  Apply online: https://feedmore.org/meals-on-wheels-application-form/  Apply by phone: [phone]  Ages 60+:  Apply Online: https://feedmore.org/meals-on-wheels-application-form/  Apply By Phone: [phone] (M-F 8:30AM-5PM)    For [place] of [place], [place] of [place], [place], [place], [place], [place], [place] and [place]: You may also apply by calling American Electric Power, The WPS Resources on Aging: [phone]  For Cities of [place], [place], and [place] as well as [place] of [place], [place], [place], [place], and [place]: Armed forces technical officer on Aging: [phone]    Pathmark Stores Service Area:    [place] of [place], [place], [place] & [place], [place], [place], [place], [place], [place], [place] and [place].  Website: GoalForum.com.au  To Apply by phone: [phone]  Apple Computer ([facility]) area:    [place] of [place], [place], [place], [place], [place], [place], and [place].  Website: dirtdocter.com.html    For  More Information: Call  ([phone] or email meals@psraaa .org  Meals on Wheels [place]:    Website: http://gonzalez-rivas.net/  To Apply Online: SecuredTickets.se  To Apply by phone: [phone]  Meals on [facility]:    To Apply by phone: [phone]  Other Resources:  MADRVA - [facility] - [facility]    [address], [place], [place] [address]   GunGroup.hu       RVA Omnicom    Multiple locations across the [place] area   https://www.rvacommunityfridges.com/fridges       Beazer Homes via Genuine Parts:     What they offer: "Missy Pinion is a nonprofit working together to build healthy communities by growing and Asbury [employer] Group. The food we grow is distributed through our network of programs and partnerships in communities where access to healthy food is limited."  Website: https://shalomfarms.org/find-fresh-food/  Phone: [phone]  Notes: [place], cards, and SNAP/EBT accepted           [facility] Friendship Cafs:     What they offer: A nutritious midday meal and interaction with fellow community members. There are 20 Friendship Cafes through the region including [address], and the [employer] of [place], [place], [place], [place], [place], [place], and H[employer]R [employer]:  https://spancenter.org/service/friendship-cafes/   Phone: (639) [phone] Email: cafe@spancenter .org        Notes: Visit the website above to apply or call The SPAN center directly to have an application mailed to you.  Donation based fee system for meals  Days and times vary depending on location, but most are open from 9:30AM-1:00PM, 4 days per week, and are closed on Saturday, Sunday, and major holidays    World Fuel Services Corporation*  (Call United Way/211 if need more resources.)  Utilities    CommonHelp Henry [employer]in)    What they offer: Partnership with the [place]  Department of  Social Services. Assist with finding and applying for government funded programs and benefits. You can also update your benefits or report changes through CommonHelp.  Website: https://molina.com/.[place] .gov/  Phone Number: 571-875-1934     [facility] Argonne  Power EnergyShare    What they offer: EnergyShare is Dominion's energy assistance program of last resort for anyone who faces financial hardships from unemployment or family crisis.  Phone Number: 818-085-6291  Address: [address] [place], [place] [address]  Website: https://www.dominionenergy.com/[place] /billing/billing-options/energyshare       Financial trader (EAP) - Department of Social Services    What they offer: EAP assists low-income households in meeting their immediate home energy needs.      Website: https://www.dss.[place] .gov/benefit/ea/  Available assistance:   Crisis Assistance - Heating Emergencies  Fuel Assistance - Offset Heating Fuel Costs  Cooling Assistance - Applies to Cooling Utility Bills and Equipment  How to apply:  Online:  https://commonhelp.[place] .gov/  Call:  [phone]   Paper application:   Print application from https://www.dss.[place] .gov/benefit/ea/ and submit to your local Department of Social Services       Local Department of Social Services    Local Department of Social Services contacts: https://www.dss.[place] .gov/localagency/index.[place] of [place], [place] Utility Affordability Programs    BoiseTaxis.si  Call:  [phone]   Email:  dpucustserv@rva .gov  Programs available:  Equal Monthly Payment Plan, MetroCare Foot Locker Program, Sprint Nextel Corporation Program, Loss adjuster, chartered, Pharmacist, community, Research scientist (medical), PromisePay Designer, multimedia, Low-Income Household Water Assistance Program (Liberty Mutual)     Medical Care  Con-way Financial Assistance    What they offer: The Asbury Autom[employer] Group helps uninsured  patients who do not  qualify for government-sponsored health insurance and cannot afford to pay for their medical care. Insured patients may also qualify for assistance based on family income, family size, and medical needs.  Phone Number: 3866392405  How to apply for the Bristol-Myers Squibb Assistance Program:  Option 1: To apply for financial assistance, a patient (or their family or other provider) should fill out the Software engineer. Copies of the Financial Assistance Application and the FAP may be obtained for free by calling the Con-way customer service department at [phone].  Option 2: The Software engineer and policy may be obtained for free by downloading a copy from the Emerson Electric:  https://www.bonsecours.com/patient-resources/financial-assistance  Applications are available in several languages on the website     [facility] Healthcare Financial Assistance    What they offer: [facility] has a Engineer, petroleum that provides free or discounted health care to qualified patients.  Website:  InhalerProducts.com.ee  Phone Number for Patient Benefit Advisors: [phone]  [facility] Health Financial Assistance    What they offer: Help with understanding a bill, finding out what insurance pays, applying for financial aid, or setting up a payment plan.  Website:  http://www.welch.com/  Editor, commissioning Call Center: [phone]  Care-A-Van [facility] [place]    What they offer:   Mobile healthcare resources for uninsured patients.  Website:  https://www.bonsecours.com/locations/community-services/[place]-care-a-van  Contact: [phone] between 7:00 - 8:30 am to schedule same day appointments     Every Women's Life (EWL)    What they offer:  Free mammograms and pap smears to eligible women through a grant from the [place]  department of Health.  Eligibility: 45-66  years of age, resident of [place] , no insurance, yearly income falls within federal guidelines.                         Every Woman's Life can enroll women between the ages of 45-39 with abnormal Pap     tests or breast lumps,. when referred by a medical provider.  Contact: [phone]     [facility] Financial Assistance    What they offer: [facility] provides financial assistance to patients based on their income, assets, and needs. Assistance may also be provided in obtaining free or low-cost health insurance or arranging a manageable payment plan.  Website: RewardMax.nl  Assistance application can be downloaded from above website  Financial Counseling Call Center: [phone]    Medications  Good Rx    What they offer: Good Rx tracks prescription drug prices and provides free drug coupons for discounts on medications.  Website: https://www.goodrx.com/  NeedyMeds    What they offer: NeedyMeds offers free information on medications and healthcare cost savings programs including prescription assistance programs, coupons, and discount programs.  Website: https://www.needymeds.org/  Helpline: (220) 816-7345     RX Assist    What they offer: Information about free and low-cost medicine programs.  Website: https://www.clark.net/  Walmart $4 Prescription Program    What they offer: Prescription Program includes up to a 30-day supply for $4 and a 90-day supply for $10 of some covered generic drugs at commonly prescribed dosages  Website: https://jacobson-moore.net/        Financial Opportunity Centers:  Humankind   What they offer: free coaching services in the areas of Employment, Financial, and Benefits Access   Phone Number: [phone]   Address: [address] [place], [place] [address]   Website: https://www.humankind.org/economic-resource-center/financial-opportunity-center-[place]/  Neighborhood Resource  Center of Dave   What they offer: free coaching services in the areas of Employment, Financial, and Benefits Access   Phone Number: 508-449-4120   Email: info@nrccafe .org   Address: [address] [place], [place] [address]   Website: http://www.yang.com/       Southside Freescale [employer]ductor   What they offer: free coaching services in the areas of Employment, Surveyor, quantity, and Benefits Access   Phone Number: (670)713-5590   Email: community@scdhc .com   Address: [address], [place]  [address]            [place]  Career Works   [address] [place], [place] [address]  Phone: [phone]  Fax: [phone]  http://lawson-house.com/   One-on-one assistance with resumes, cover letters and thank you letters  Assistance with completing employment applications  Career counseling  Development of an individual employment plan  In-depth interviewing skills development  Career and skills testing  Occupational skills training, on-the-job training, job readiness training  Referrals to specialized services       If you are interested in working with a therapist in [place] , we encourage you to contact your insurance company for a list of providers, go to www.psychologytoday.com and select Find a Therapist, or contact any of the following therapists in your area:  Advanced [address], [address], [place], [place] [address], [phone], www.[email or web address]  [facility], [address] [place], [place], [place] [address], [phone] (accepts Medicaid)  [facility], [address], [place], [place] [address], [phone], www.calmingwindcounseling.com  Dominion Omnicare of [address], Jabil Circuit, [place], [place], [place] [address], [phone]  [facility]ion [address] [facility] of [place], [address]  [place], [address], [place], [place] [address], [phone]  [facility], [address], [address], [place], [place] [address], [phone]  Good [address] [place], [place] [address], [phone]  Healing [address] and Services, [place], [address], [place], [place] [address], [phone]  [facility], [address], [place], [place] [address], [phone]  [facility], [address], [place], [place] [address], [phone]  [facility], [address] [place]. [place], [place] [address], [phone], BargainContractor.si (accepts Medicaid)  [facility] and [address], [address] [place], [place], [place] [address], [phone]  [facility], Bernice Macario Daring, LCSW, [address] [place], [place] [address], [phone]  [facility], [address], [place]. [place], [place] [address], [phone], www.myndmatterscounseling.com  [facility], [address] [place] [place], [place], [address], [phone]  [facility], [address] [place], [place], [place] [address], [phone]  [facility], Lamarr Bouche, LCSW, [address], [place], [place] [address], [phone]  [facility], Norleen Hoots, LCSW, [address] [place], [place], [address], [phone]  [place]  [facility], [address], [place], [place] [address], [phone]      SUICIDE HOTLINES:   If you or someone you care about is suicidal, please contact any of the following toll-free 24-hour crisis hotlines for suicide prevention and support. Your call is free and confidential.   National Suicide Prevention Lifeline at 89, www.suicidepreventionlifeline.org   The National Hopeline Network at Exxon Mobil Corporation 7403713241)   You may also contact IMAlive at www.imalive.org to  access a live online network of volunteers trained and certified in crisis intervention or text HOME to 867-416-4604 to be connected with a live, trained  crisis counselor through International Paper who will help you move from a hot moment to a cool moment.     OTHER HOTLINES:  Adult Abuse & Neglect 24 Hour Hotline: 1 (661)800-8610   Alcohol & Drug Abuse 24 Hour Hotline: 1 (800) ALCOHOL 805-189-3595)   Alcohol & Drug Abuse 24 Hour Hotline: 1 (800) 178-5642   Central Intake for Emergency Shelter/Transitional Housing: -Men: 2128488987; Women & Families: (409)846-2657   [place] Respite Care Program: (321)317-9246 -[place]  (domestic violence): 682-392-8286   Crisis Text Line 770-713-0063   VA Child Abuse & Neglect 24 Hour Hotline: 1 308-728-9268   Child Find of America: 1(800) I AM LOST (781) 739-8784)   Child Help USA : 1(800) 4-A-CHILD (199-577-5546)   [place]ocaine Hotline: 1(800) COCAINE (914)040-5731)   Electronic Benefits Transfer Customer Service 24 hour Help-Line: (541)169-1447      EMERGENCY:   If you are experiencing an emergency, please call 911 or go to the nearest emergency room. If you are experiencing a mental health crisis, please call Capital One Authority 24/7 crisis services at [phone].     MEDICAID-FUNDED CRISIS STABILIZATION AND MENTAL HEALTH SKILL BUILDING: Crisis stabilization services provide short term intensive mental health care to individuals who are experiencing an acute psychiatric crisis. The goal is to address and stabilize mental health needs as early as possible. Mental health skill building services are provided for individuals 18 years and older who suffer from serious mental illness, such as bipolar disorder, depression, substance abuse issues, and other psychotic disorders. Please consider contacting the following agencies to learn more about and self-refer for crisis stabilization and/or mental health skill-building services:      [facility] (p: [phone] f:  [phone])   [facility] (p: [phone] f: [phone])   [facility] (p: [phone] f: [phone])   [facility] (p: [phone] f: [phone])   [facility] (p: [phone] f: [phone])   [facility] (p: [phone] f: [phone])   [facility] (p: [phone] f: [phone])   [facility] (p: [phone] f: [phone])   [facility] (p: [phone] f: [phone])   [facility] (p: [phone] f: [phone])   [facility] (p: [phone] f: [phone])   [facility] [place]  (p: [phone] f: [phone])   [facility] (p: [phone] f: [phone])   [facility] (p: [phone] f: [phone])   [facility]: p: [phone] f: [phone] [facility]: p: [phone] f: [phone])   [facility] [place]  (p: [phone] f: [phone])   [facility] (p: [phone] f: [phone])   [facility] (p: [phone] f: [phone])   [facility] [place]  (p: [phone] f: [phone])   [facility] (p: [phone])     DOMESTIC VIOLENCE SUPPORT:  If you are interested in talking with someone regarding housing concerns, please call [facility]  at [phone], the [facility] at [phone], or The [facility] only) at [phone].     There are several domestic violence resources in the area such as the [place] Family Violence and Sexual Assault Hotline at [address], [place], [place] [address], Phone: [phone]. Also the [facility] runs an emergency shelter and 24-hour hotlines. In [place] call [phone]. Additional resources for information are the   Family Violence and Sexual Assault 24-hour Hotline: 867-022-0836, the [employer] Violence Hotline 432-703-9965 (SAFE),  and [facility]  at [phone].      [employer] Net is a 24 hour a day, 7 day a week service to assist with finding housing and resources for persons who are victims of domestic violence. They can be reached vie phone call or text at [phone].    SHELTER, HOUSING, & SOBER LIVING RESOURCES:   If you need shelter in the [place] area, please contact the Homeless Connection Line at [phone], Monday through Friday from 8:30am to 5:00pm. If you do not have phone access, you can visit them in person at [facility] at [address] This location can offer shelter during the day, access to basic clothing and toiletry items, and Wi-Fi. If shelter is not immediately available for you, you should contact the Housing Crisis Line at least once a week to update your status. After two weeks of no contact from you, this agency will assume you found housing and remove your housing request.      [place]  [facility]   [address], [place], [place] [address], [phone] (p), [phone] (f)   Website: www.[email or web address]   Service area: [place]    Notes: [place]  [facility] owns and operates affordable housing communities throughout the state. Provides supportive services, including case management, counseling, and skills training.      [employer] [facility] [employer] (RRHA)   [address] [place], [place] [address], [phone] (p)   E-mail: info@rrha .com, Website: www.rrha.com   Service area: [facility] [place]   Notes: [employer] and [employer] helps families transform their lives and revitalizes communities across the city by providing quality housing, family self-sufficiency opportunities for residents, and community and Dance movement psychotherapist initiatives that contribute to the continued success of the [place].      [facility]   [address], [place], [place] [address], [phone] or  [phone] (p), [phone] (f)   E-mail: [email or web address] [email or web address], Website: www.betterhousingcoalition.org   Point of Contact: Rosaline Ampah   Service area: Homes available for purchase or rent in [place] and [employer] of Operation: Monday -- Friday, 9:00 AM - 5:00 PM   Notes: Provide affordable housing options for renters and buyers and programs to support their residents. They also operate affordable senior living communities. There is a Clinical cytogeneticist for each community. Contact information varies for each unit and is included with the property listing on the website. Contact them directly to learn more about each unit and its availability.      [facility] [employer]   [address] [place], [place] [address], [phone]   Website: [email or web address]   Hours of Operation: 9:00 AM - 4:00 PM   Service Area: Rental units available in the [facility] neighborhood of [place].   Notes: [facility] owns and operates residential housing. Income limit is $26,000. Units include a private room with shared living space. Cost is $450 a month with all utilities included. Housing units are not co-ed. Applications can be found at [facility] & Ruth's office (also a thrift store) at the counter.      [facility] [place]  (HOME)   [address], [place], [place] [address], [phone]   E-mail: help@homeofva .org, Website: homeofva.org   Hours of Operation: Monday - Friday 9:00 AM - 5:00 PM   Service Area: [address], [place], [place] and [place]   Notes: HOME is a Energy manager. They provide homebuyer education, money management and credit  recovery classes, pre-purchase counseling, down payment assistance to qualifying individuals, foreclosure prevention counseling, and tenant workshops for renters. For counseling services, intake forms can be downloaded from [facility] website. Call or email to learn  more about class and workshop offerings. Can provide services in Spanish.      [employer] [facility] [employer] (CAPUP)   [address] [place], [place], [place] [address], [phone] (p), [phone] (f)   E-mail: info@capup .org, Website: www.capup.org   Point of Contact: Princella Hurst   Hours of Operation: Monday Friday 9:00 AM -- 5:00 PM   Service Area: [employer]   Notes: [facility]UP aids low-income individuals and families with emergency rent and mortgage payments. Emergency services are provided by appointment only. An application must be prepared prior to the appointment. Services are offered on a limited basis. Call for more information.      [facility]   [address], [place] [address], [phone] (main), [phone] (crisis line)   E-mail: info@[email or web address] .[email or web address], Website: www.[email or web address].org   Point of Contact: [place]   Hours of Operation: 24 Hour Housing   Service Area: [place]   Notes: Community case managers work with single adults and families who are in a housing crisis to ensure they receive all the services they need to transition into permanent housing.      Holiday representative of the [employer]    [address], [place], [place] [address], [phone] (main)   Website: www.virginiasalvationarmy.org   Hours of Operation: Monday - Wednesday 8:30AM -- 5PM   Service Area: [place]   Notes: Program funds are used to stop clients from being evicted or from home foreclosure. The program is offered as funding permits. Potential clients are encouraged to call to check on availability of funds. If available, they will need to come to the Social Service office to be prescreened to determine basic eligibility upon receipt of a 5 Day Pay-or-Quit Notice (or equivalent) for rentals or a notice of foreclosure from their mortgage company.      You can call the [facility] at [phone] to learn about their recovery houses and other programs in the [place] area.      You may contact [facility]  ([facility]) Houses to learn about the transitional recovery residences for men that they operate in [place], [place]. Octaviano Mealy: [phone].      You can contact any of the [employer] locations below to learn about recovery houses and other programs in the [place] area:   [address], [address] [place], [place] [address], [phone]   [facility], [address] [place] [address] [place], [place] [address], [phone], [address], [place] [address], [phone]   [facility], [address] [place], [place] [address], [phone]   [address] [place], [place] [address], [phone]   [facility], [address] [place], [place] [address], [phone], [address], [place] [address], [phone]   [facility], [address], [place] [address], [phone], [address] [place], [place] [address], [phone]   [facility], [address] [place], [place] [address], [phone]   [facility], [address] [place], [place] [address], [phone]   [facility], [address] [place], [place] [address]      GENERAL MENTAL HEALTH QUESTIONS:   Please consider calling the  [facility]) Warmline at [phone] to receive support or learn about additional resources in your area. The MHA also holds educational and supportive meetings for persons who are recovering from mental health issues. The phone line is available from 9:00 AM to 9:00 PM Monday - Friday, and 5:00 PM to 9:00 PM Saturday and Sunday. If you prefer to text, please text to [phone] anytime from 5:00 PM to 9:00 PM Wednesday, Friday, or Saturday.      SUBSTANCE USE TREATMENT, RECOVERY MEETINGS, AND GROUPS:   Participating in 12-step groups can help support your sobriety and prevent relapse. Please  consider attending Alcoholics Anonymous meetings for alcohol use treatment. AA meetings in your area can be located by visiting SalaryStart.tn or www.aavir[place]ia.org or by contacting [employer] at ([phone] or www.aarichmond.org, or you may call the Substance Abuse and Mental Health Services Hotline at 1-800-662-HELP (878)788-2681). Getting a sponsor for coaching and guidance can also be helpful. Please ask for a sponsor when you attend your next meeting. We recommend attending 90 meetings in 90 days.      Please ask your family, friends, and caregivers to participate in Al-Anon. Al-Anon members are those concerned about the alcohol addiction of another. Al-Anon's program of recovery is adapted from Alcoholics Anonymous and uses Al-Anon's Twelve Steps, Twelve Traditions, and Twelve Concepts. Local meetings can be located by going to www.al-anon.DailyCDs.uy.      Participating in 12-step groups can help support staying clean and prevent relapse. Please consider Narcotics Anonymous meetings for substance use treatment. NA meetings in your area can be located by going to PaymentConversion.is or by calling [phone]. Getting a sponsor for coaching and guidance can also be helpful. Please ask for a sponsor when you attend your next meeting. We recommend attending 90 meetings in 90 days.      Please ask your family, friends, and caregivers to participate in Nar-Anon. Nar-Anon members are those concerned about the addiction or drug problem of another. Nar-Anon's program of recovery is adapted from Narcotics Anonymous and uses Nar-Anon's Twelve Steps, Twelve Traditions, and Twelve Concepts. Nar-Anon meetings in your area can be located by going to www.nar-anon.org/find-a-meeting or calling ([phone].      SMART Recovery is a group that offers support for individuals who have chosen to abstain, or are considering abstinence, from any type of addictive behaviors (substances or activities) by teaching how to change  self-defeating thinking, emotions, and actions, and to work toward long-term satisfaction and quality of life. Please go to www.smartrecovery.org to learn more about programs in your area.     Celebrate Recovery is a faith-based, 12-step recovery program for anyone struggling with hurt, pain, or addiction of any kind. Celebrate Recovery is a faith-based program that may or may not coincide with your personal beliefs or the medical recommendations of your hospital treatment team. If you are interested in learning more about their program, please go to www.celebraterecovery.com.      [employer], [address] ([phone]), (also known as the Treatment Referral Routing Service) is a confidential, free, 24-hour-a-day, 365-day-a-year, information service, in [place] and [place], for individuals and family members facing mental and/or substance use disorders. This service provides referrals to local treatment facilities, support groups, and community-based organizations and can also be accessed at FebruarySpecials.fi. Callers can also order free publications and other information.      The Family Education Program is a collaborative effort dedicated to providing quality education and support to families of people struggling with substances in  our community. This group meets every Thursday from 6:30pm - 8:00pm at [address], [place] [address]. Please contact the organizer, Charlena Ripa, at [phone] or recovery@vcu .edu to learn more about this program.      [facility] is the Substance Abuse & Addiction Recovery Alliance. [facility] offers substance use and anger management groups. Please contact [facility] at ([phone] or www.saara.org to learn more about this group. [employer] is located at [address] [place], [place], [address]. SAARA's [employer], where they hold their in-person recovery groups at the [facility], is located at [address] [place],  [place], [place], [address].     For residential substance use treatment please consider contacting Rockey Jamaica, a treatment consultant with [facility]. He can be reached at [phone]. He can work with [employer] company to find affordable substance use or alcohol use treatment.    For residential substance use treatment please consider contacting Alan Christians, a treatment consultant with [facility]. She can be reached at [phone]. She can work with [employer] company to find affordable substance use or alcohol use treatment.    For inpatient substance use treatment please consider [facility] They are located at [place], [place] [address] and can be reached at [phone]    We encourage you to follow up with any of the following facilities for residential substance use treatment:  [facility], [address], [place] [address], [phone]  [facility] [address], [place] [address], [phone]  [facility]), [address] [place], [place] [address], [phone]  [facility] [place], [address] [place], [place] [address], [phone]  [facility], [address], [place], [place] [address], [phone], [address], [place], [place] [address], [phone]  [facility], [address] [place], [place] [address], [phone]  [facility], [place], [place], [phone]  [employer], [address], 5th Floor, [place], [place] [address], [phone], Inc., [address], [place] [address], [phone]

## 2023-12-21 NOTE — Behavioral Health Treatment Team (Signed)
 Assumed care of patient after receiving shift report from outgoing nurse.  Pt in hall at change of shift.  No apparent distress.  Pt current negative for SI/HI, negative for AVH, positive for Depression 8/10, negative Anxiety.  Medication and meal compliant.  Mood is calm and cooperative with flat affect.  No apparent delusions perceived. Pt alert and oriented.  Pt ambulates independently.  No current behavioral issues observed.  Emotional support and encouragement provided. Last BM yesterday, pt states no current constipation and will let us  know if they have any future issues moving their bowels.  Q15 minute safety continued for safety by techs and separate hourly checks done by myself.

## 2023-12-21 NOTE — Plan of Care (Signed)
 Problem: Depression/Self Harm  Goal: Effect of psychiatric condition will be minimized and patient will be protected from self harm  Description: INTERVENTIONS:  1. Assess impact of patient's symptoms on level of functioning, self care needs and offer support as indicated  2. Assess patient/family knowledge of depression, impact on illness and need for teaching  3. Provide emotional support, presence and reassurance  4. Assess for possible suicidal thoughts or ideation. If patient expresses suicidal thoughts or statements do not leave alone, initiate Suicide Precautions, move to a room close to the nursing station and obtain sitter  5. Initiate consults as appropriate with Mental Health Professional, Spiritual Care, Psychosocial CNS, and consider a recommendation to the LIP for a Psychiatric Consultation  12/21/2023 1104 by Gerlean Lesches, RN  Outcome: Not Progressing

## 2023-12-21 NOTE — Group Note (Signed)
 Group Therapy Note    Date: 12/21/2023    Group Start Time: 1000  Group End Time: 1100  Group Topic: Relaxation    [facility] ACUTE BEHAV [facility]    Jye Fariss S        Group Therapy Note    Attendees: 5       Patient's Goal:  To participate in relaxation activity    Notes:  Pt did not attend session  Discipline Responsible: Recreational Therapist      Signature:  Zayla Agar S Ernesto Zukowski

## 2023-12-21 NOTE — Behavioral Health Treatment Team (Signed)
 Night shift assessment completed.    Patient is visible, calm and cooperative. Affect is flat, mood is okay. Patient endorse is been okay No signs of distress observed. Patient endorsed anxiety, and depression of 8/10, denied HI, SI,and  AVH. PRN trazodone  administered per request. Patient is compliant with meds and meals. No aggressive behavior noted. Emotional support and encouragement provided. Q 15 minutes and hourly rounds in progress. Pt slept for the total of 8 hours.

## 2023-12-21 NOTE — Group Note (Signed)
 Group Therapy Note    Date: 12/21/2023    Group Start Time: 1500  Group End Time: 1600  Group Topic: Recreational    [facility] ACUTE BEHAV [facility]    Rozetta Stumpp S        Group Therapy Note    Attendees: 5       Patient's Goal:  To concentrate on selected task    Notes:  Pt did not attend session  Discipline Responsible: Recreational Therapist      Signature:  Annakate Soulier S Kourtnei Rauber

## 2023-12-21 NOTE — Plan of Care (Signed)
 Problem: Anxiety  Goal: Will report anxiety at manageable levels  Description: INTERVENTIONS:  1. Administer medication as ordered  2. Teach and rehearse alternative coping skills  3. Provide emotional support with 1:1 interaction with staff  12/21/2023 1104 by Gerlean Lesches, RN  Outcome: Progressing  12/20/2023 2243 by Ferris Kraft, RN  Outcome: Not Progressing     Problem: Depression/Self Harm  Goal: Effect of psychiatric condition will be minimized and patient will be protected from self harm  Description: INTERVENTIONS:  1. Assess impact of patient's symptoms on level of functioning, self care needs and offer support as indicated  2. Assess patient/family knowledge of depression, impact on illness and need for teaching  3. Provide emotional support, presence and reassurance  4. Assess for possible suicidal thoughts or ideation. If patient expresses suicidal thoughts or statements do not leave alone, initiate Suicide Precautions, move to a room close to the nursing station and obtain sitter  5. Initiate consults as appropriate with Mental Health Professional, Spiritual Care, Psychosocial CNS, and consider a recommendation to the LIP for a Psychiatric Consultation  Outcome: Not Progressing

## 2023-12-22 LAB — VALPROIC ACID LEVEL, TOTAL: Valproic Acid: 42 ug/mL — ABNORMAL LOW (ref 50–100)

## 2023-12-22 MED ORDER — HYDROXYZINE HCL 50 MG PO TABS
50 | ORAL_TABLET | Freq: Three times a day (TID) | ORAL | 0 refills | 20.00000 days | Status: AC | PRN
Start: 2023-12-22 — End: 2024-01-11

## 2023-12-22 MED ORDER — CITALOPRAM HYDROBROMIDE 10 MG PO TABS
10 | ORAL_TABLET | Freq: Every day | ORAL | 0 refills | 30.00000 days | Status: DC
Start: 2023-12-22 — End: 2024-01-23

## 2023-12-22 MED ORDER — TRAZODONE HCL 50 MG PO TABS
50 | ORAL_TABLET | Freq: Every evening | ORAL | 0 refills | 30.00000 days | Status: DC | PRN
Start: 2023-12-22 — End: 2024-01-24

## 2023-12-22 MED ORDER — DIVALPROEX SODIUM 500 MG PO TBEC
500 | ORAL_TABLET | Freq: Two times a day (BID) | ORAL | 0 refills | 30.00000 days | Status: DC
Start: 2023-12-22 — End: 2024-02-02

## 2023-12-22 MED FILL — FOLIC ACID 1 MG PO TABS: 1 mg | ORAL | Qty: 1 | Fill #0

## 2023-12-22 MED FILL — MULTIVITAMIN ADULT PO TABS: ORAL | Qty: 1 | Fill #0

## 2023-12-22 MED FILL — TRAZODONE HCL 50 MG PO TABS: 50 mg | ORAL | Qty: 1 | Fill #0

## 2023-12-22 MED FILL — CITALOPRAM HYDROBROMIDE 20 MG PO TABS: 20 mg | ORAL | Qty: 1 | Fill #0

## 2023-12-22 MED FILL — DIVALPROEX SODIUM 500 MG PO TBEC: 500 mg | ORAL | Qty: 1 | Fill #0

## 2023-12-22 MED FILL — PHENOBARBITAL 32.4 MG PO TABS: 32.4 mg | ORAL | Qty: 1 | Fill #0

## 2023-12-22 MED FILL — THIAMINE HCL 100 MG PO TABS: 100 mg | ORAL | Qty: 1 | Fill #0

## 2023-12-22 NOTE — Group Note (Signed)
 Group Therapy Note    Date: 12/22/2023    Group Start Time: 1000  Group End Time: 1100  Group Topic: Relaxation    [facility] ACUTE BEHAV HLTH    Bradley [name] S        Group Therapy Note    Attendees: 3       Patient's Goal:  To participate in relaxation activity    Notes:  Pt did not attend session  Discipline Responsible: Recreational Therapist      Signature:  Carlissa Pesola S [place]

## 2023-12-22 NOTE — Behavioral Health Treatment Team (Signed)
 Discharge Note     Patient discharged home to fiance via Lyft. Patient is free of SI,HI and AVH at time of discharge. Patient given belongings, valuables and copy of AVS. This Student RN and Ellie RN reviewed AVS with with patient. Patient verbalized understanding. Sincere, Student RN escorted patient to ED entrance.

## 2023-12-22 NOTE — Progress Notes (Signed)
 PSYCHIATRIC PROGRESS NOTE    Chief Complaint:  Things are better.     Length of Stay: 4 Days    Interval History:  10/01 - Mr. Bradley [name] is doing well today. He slept well last night and his mood is so much better. Denies any AH or VH. Denies any SI or plan. At the present time the patient Bradley [name] remains compliant with taking medications. Denies any adverse events from taking them and feels they have been beneficial.       9/30 - Mr. Bradley [name] slept 8 hours last night and is minimally better. He remains depressed and hopeless. Frequency of SI has reduced today. At the present time the patient Bradley [name] remains compliant with taking medications. Denies any adverse events from taking them and feels they have been beneficial. Denies any AH or VH.     Past Medical History:  Past Medical History:   Diagnosis Date    Abuse, drug or alcohol (HCC)     alcohol abuse          citalopram  10 mg Oral Daily    divalproex  500 mg Oral BID    PHENobarbital  16.2 mg Oral BID    thiamine  100 mg Oral Daily    folic acid  1 mg Oral Daily    multivitamin  1 tablet Oral Daily       Labs:  Lab Results   Component Value Date/Time    WBC 9.7 12/17/2023 04:56 PM    HGB 14.4 12/17/2023 04:56 PM    HCT 40.8 12/17/2023 04:56 PM    PLT 293 12/17/2023 04:56 PM    MCV 94.4 12/17/2023 04:56 PM      Lab Results   Component Value Date/Time    NA 140 12/17/2023 04:56 PM    K 3.9 12/17/2023 04:56 PM    CL 99 12/17/2023 04:56 PM    CO2 25 12/17/2023 04:56 PM    BUN 8 12/17/2023 04:56 PM    GLOB 3.2 12/17/2023 04:56 PM    ALT 81 12/17/2023 04:56 PM          Vitals:    12/22/23 0848   BP: 125/75   Pulse: 83   Resp:    Temp: 98.1 F (36.7 C)   SpO2: 100%        Physical Exam:  Body habitus: Body mass index is 25.64 kg/m.  Musculoskeletal system: normal gait  Tremor - neg  Cog wheeling - neg    Mental Status Exam:  Bradley [name] is a 58 y.o. YO male dressed in hospital apparel. Fairly groomed.    Eye contact is fair  Psychomotor activity: WNL  Speech is  spontaneous, with normal, volume, tone, rhythm  Thought process is logical and goal directed.   Mood is reported as okay and Affect is flat  Thought content :        SI -             [x]       HI -             []        Delusions - []   Level of suicide risk is:        None -          []        Low   -          []   Moderate -    [x]                             High -            []   Perception: Denies AH or VH.   Cognition appears to be intact.   Insight is partial, judgment is poor.     Assessment and Plan:  Bradley [name] meets criteria for a diagnosis of Mood disorder unspecified, rule out bipolar disorder; alcohol use disorder, severe; stimulant use disorder, moderate.    Continue the medication regimen as prescribed   Disposition planning to continue.     A coordinated, multidisplinary treatment team round was conducted with the patient, nurses, pharmcist, Administrator present. Discussions held with case manager, and/or with family members; Complete current electronic health record for patient was reviewed in full including consultant notes, ancillary staff notes, nurses and tech notes, labs and vitals.    I certify that this patients inpatient psychiatric hospital services furnished since the previous certification were, and continue to be, required for treatment that could reasonably be expected to improve the patient's condition, or for diagnostic study, and that the patient continues to need, on a daily basis, active treatment furnished directly by or requiring the supervision of inpatient psychiatric facility personnel. In addition, the hospital records show that services furnished were intensive treatment services, admission or related services, or equivalent services.

## 2023-12-22 NOTE — Progress Notes (Signed)
 Laboratory Monitoring for Valproic Acid    This patient is currently prescribed the following medication(s):   Current Facility-Administered Medications: citalopram (CELEXA) tablet 10 mg, 10 mg, Oral, Daily  divalproex (DEPAKOTE) DR tablet 500 mg, 500 mg, Oral, BID  [EXPIRED] PHENobarbital tablet 64.8 mg, 64.8 mg, Oral, 4x daily **FOLLOWED BY** [COMPLETED] PHENobarbital tablet 32.4 mg, 32.4 mg, Oral, 4x daily **FOLLOWED BY** [COMPLETED] PHENobarbital tablet 32.4 mg, 32.4 mg, Oral, BID **FOLLOWED BY** PHENobarbital tablet 16.2 mg, 16.2 mg, Oral, BID  [EXPIRED] PHENobarbital tablet 64.8 mg, 64.8 mg, Oral, Q6H PRN **FOLLOWED BY** PHENobarbital tablet 32.4 mg, 32.4 mg, Oral, Q6H PRN **FOLLOWED BY** PHENobarbital tablet 16.2 mg, 16.2 mg, Oral, Q6H PRN  acetaminophen (TYLENOL) tablet 650 mg, 650 mg, Oral, Q4H PRN  polyethylene glycol (GLYCOLAX) packet 17 g, 17 g, Oral, Daily PRN  aluminum & magnesium hydroxide-simethicone (MAALOX PLUS) 200-200-20 MG/5ML suspension 30 mL, 30 mL, Oral, Q6H PRN  hydrOXYzine HCl (ATARAX) tablet 50 mg, 50 mg, Oral, TID PRN  haloperidol (HALDOL) tablet 5 mg, 5 mg, Oral, Q4H PRN **OR** haloperidol lactate (HALDOL) injection 5 mg, 5 mg, IntraMUSCular, Q4H PRN  diphenhydrAMINE (BENADRYL) injection 50 mg, 50 mg, IntraMUSCular, Q4H PRN  traZODone (DESYREL) tablet 50 mg, 50 mg, Oral, Nightly PRN  thiamine tablet 100 mg, 100 mg, Oral, Daily  folic acid (FOLVITE) tablet 1 mg, 1 mg, Oral, Daily  multivitamin 1 tablet, 1 tablet, Oral, Daily    The following labs have been completed for monitoring of valproic acid:    Valproic Acid Serum Concentration  Lab Results   Component Value Date/Time    [facility] 42 12/22/2023 05:23 AM         Hepatic Function  Lab Results   Component Value Date/Time    BILITOT 0.3 12/17/2023 04:56 PM    GLOB 3.2 12/17/2023 04:56 PM    ALBUMIN 4.5 12/17/2023 04:56 PM    ALT 81 12/17/2023 04:56 PM    AST 55 12/17/2023 04:56 PM    ALKPHOS 90 12/17/2023 04:56 PM    ALKPHOS 120 02/18/2021  02:46 PM       Hematology  Lab Results   Component Value Date/Time    WBC 9.7 12/17/2023 04:56 PM    RBC 4.32 12/17/2023 04:56 PM    HGB 14.4 12/17/2023 04:56 PM    HCT 40.8 12/17/2023 04:56 PM    MCV 94.4 12/17/2023 04:56 PM    MCH 33.3 12/17/2023 04:56 PM    MCHC 35.3 12/17/2023 04:56 PM    RDW 14.4 12/17/2023 04:56 PM    PLT 293 12/17/2023 04:56 PM       Assessment/Plan:  Valproic acid level obtained on 10/2 is subtherapeutic at 42 mcg/mL (range 50-125 mcg/mL) on dose of divalproex DR 500 mg BID.  Level was not obtained at steady-state, drawn 1 day early due to anticipated discharge.  Level was appropriately obtained as a trough, approximately 9 hours post-dose.     Consider dose increase if clinically indicated.       [address] [place], [place]  [phone]

## 2023-12-22 NOTE — Discharge Summary (Signed)
 DISCHARGE SUMMARY    Some parts of the discharge summary are from the initial Psychiatric interview that was done on admission by the admitting psychiatrist.      Date of Admission: 12/18/2023    Date of Discharge:12/22/2023     TYPE OF DISCHARGE:   REGULAR -  YES    AMA - NO  RELEASED BY THE TDO COURT - NO    ADMISSION EVALUATION:  CHIEF COMPLAINT:  I don't feel so good.     HISTORY OF PRESENT ILLNESS:  The patient is a 58 year old Caucasian man, who is currently admitted after being transferred to us  from [facility].  He had reported there with a history of having expressed suicidal ideation and depressed mood.  He reports that he has been drinking quite heavily and in the past week, he thinks he had almost 36 beers every day.  He states he has had one withdrawal seizure in the past, although he cannot remember when this was.  States that he was discharged from [facility] about a week ago and did not take any of his medications including Depakote and Celexa.  He reports that he was feeling depressed, hopeless and having thoughts of cutting himself with a knife.  He also uses cocaine, but states he uses it rarely and last used it 3 days ago.  Urine drug screen was negative and blood alcohol level was 330.  Denies use of any other recreational substances.  States that he is feeling depressed and hopeless today and also feels paranoid that somebody is watching him.  Nursing staff reports that he has been somewhat irritable since his arrival on the milieu, but has not been agitated or aggressive.  He was calm and cooperative during the interview.     PAST MEDICAL HISTORY:  Reviewed as per the history and physical exam.        Past Medical History        Past Medical History:   Diagnosis Date    Abuse, drug or alcohol (HCC)       alcohol abuse         Home Medications           Prior to Admission medications   Medication Sig Start Date End Date Taking? Authorizing Provider   citalopram (CELEXA) 10 MG  tablet Take 1 tablet by mouth daily 12/11/23   Yes [provider]   hydrOXYzine HCl (ATARAX) 50 MG tablet Take 1 tablet by mouth every 6 hours as needed for Anxiety 12/11/23   Yes [provider]   melatonin 3 MG TABS tablet Take 2 tablets by mouth nightly 12/11/23   Yes [provider]   nicotine (NICODERM CQ) 21 MG/24HR Place 1 patch onto the skin in the morning. 12/11/23   Yes [provider]   divalproex (DEPAKOTE) 250 MG DR tablet Take 1 tablet by mouth in the morning and at bedtime 12/11/23   Yes [provider]   ibuprofen  (ADVIL ;MOTRIN ) 600 MG tablet Take 1 tablet by mouth every 6 hours as needed for Pain or Fever  Patient not taking: Reported on 12/19/2023 11/02/23     Caye Toribio LABOR, MD         @DCDT @        Lab Results   Component Value Date     WBC 9.7 12/17/2023     HGB 14.4 12/17/2023     HCT 40.8 12/17/2023     MCV 94.4 12/17/2023  PLT 293 12/17/2023            Lab Results   Component Value Date     NA 140 12/17/2023     K 3.9 12/17/2023     CL 99 12/17/2023     CO2 25 12/17/2023     BUN 8 12/17/2023     CREATININE 0.70 12/17/2023     GLUCOSE 135 (H) 12/17/2023     CALCIUM 8.5 (L) 12/17/2023     BILITOT 0.3 12/17/2023     ALKPHOS 90 12/17/2023     AST 55 (H) 12/17/2023     ALT 81 (H) 12/17/2023     LABGLOM >90 12/17/2023     AGRATIO 0.8 (L) 02/18/2021     GLOB 3.2 12/17/2023            No results found for: VALAC, VALP, CARB2  No results found for: LITHM  RADIOLOGY REPORTS:(reviewed/updated 12/20/2023)  @RISRSLTEXT @  @lasthcg @        PAST PSYCHIATRIC HISTORY:  The patient reports that he has been in psychiatric treatment for many years and was just discharged from [facility] a few days ago.  He reports that he has attempted suicide in the past and has been drinking alcohol heavily for about 40 years now.  There appears to be a long history of poor compliance and followup with outpatient treatment.     PSYCHOSOCIAL HISTORY:  The patient had been  living with a family member in [place] until recently, but thinks he cannot go back to live there.  States that he has been homeless for several months now.  He is widowed, has 2 adult children with whom he has lost contact and is unemployed.  States that he has been getting social security disability for 9 years.  Denies any major legal stressors.     MENTAL STATUS EXAM:  The patient is a middle-aged Caucasian man, who is dressed in casual street clothes.  He is disheveled and has a long unkempt beard.  He makes limited eye contact.  Speech is spontaneous and coherent.  Psychomotor activity is decreased.  Mood is reported as hopeless and his affect is flat.  Passive suicidal ideation is present, but denies any active plan.  Denies any perceptual abnormalities.  Denies any auditory or visual hallucinations.  His thought process is logical and goal directed.  Cognitively, he is awake and alert, oriented to time, place, and person.  Intelligence is average.  Memory is intact and fund of knowledge is adequate.  Insight is poor.  Judgment is poor.     ASSESSMENT AND PLAN:  Diagnosis:  Mood disorder unspecified, rule out bipolar disorder; alcohol use disorder, severe; stimulant use disorder, moderate.     At the present time, we will continue his inpatient stay.  He will be provided with support and participate in the milieu.  We will detox him according to our protocol.  Estimated length of stay is 5-7 days.  His strengths include his ability to seek help and support from his therapeutic providers.    Course in the Hospital:     Patient was admitted to the inpatient psychiatry unit for acute psychiatric stabilization in regards to symptomatology as described in the HPI above and placed on Q15 minute checks and withdrawal precautions. While on the unit Bradley [name] was involved in individual, group, occupational and milieu therapy. He was started back on his usual medication regimen as well as PRN medications  including Celexa and Depakote  along with PRN's for sleep and anxiety. He improved gradually and was able to integrate into the milieu with help from the nursing staff. Patients symptoms improved gradually including SI, poor sleep, low moods, low energy. He was quite on the unit, appropriate in his interactions, and cooperative with medications and the unit routine. Please see individual progress notes for more specific details regarding patient's hospitalization course. Patient was discharged as per the plan. He had been doing well on the unit as per the report of the nursing staff and my observations. No PRN medication for agitation, seclusion or restraints were required during the last 48 hours of his stay. Bradley [name] had improved progressively to the point of being stable for discharge and outpatient FU. At this time he did not offer any complaints. Patient denied any SI or HI. Denied any AH or VH. He denied any delusions. Was not considered a danger to self or to others and is safe for discharge. Will FU with his appointments and remains motivated to be in treatment. The patient verbalized understanding of his discharge instructions.        DISCHARGE DIAGNOSIS:  Mood disorder unspecified, rule out bipolar disorder; alcohol use disorder, severe; stimulant use disorder, moderate.       Medication List        START taking these medications      traZODone 50 MG tablet  Commonly known as: DESYREL  Take 1 tablet by mouth nightly as needed for Sleep            CHANGE how you take these medications      divalproex 500 MG DR tablet  Commonly known as: DEPAKOTE  Take 1 tablet by mouth in the morning and at bedtime  What changed:   medication strength  how much to take     hydrOXYzine HCl 50 MG tablet  Commonly known as: ATARAX  Take 1 tablet by mouth 3 times daily as needed for Anxiety  What changed: when to take this            CONTINUE taking these medications      citalopram 10 MG tablet  Commonly known as:  CELEXA  Take 1 tablet by mouth daily            STOP taking these medications      ibuprofen  600 MG tablet  Commonly known as: ADVIL ;MOTRIN      melatonin 3 MG Tabs tablet     nicotine 21 MG/24HR  Commonly known as: NICODERM CQ               Where to Get Your Medications        Information about where to get these medications is not yet available    Ask your nurse or doctor about these medications  citalopram 10 MG tablet  divalproex 500 MG DR tablet  hydrOXYzine HCl 50 MG tablet  traZODone 50 MG tablet        Lab Results   Component Value Date/Time    VALAC 42 12/22/2023 05:23 AM     No results found for: LITHM  @FOLLOWUPSECTION @  WOUND CARE: none needed.    PROGNOSIS:   Good / Fair based on nature of patient's pathology/ies and treatment compliance issues.  Prognosis is greatly dependent upon patient's ability to  follow up on psychiatric/psychotherapy appointments as well as to comply with psychiatric medications as prescribed.

## 2023-12-22 NOTE — Behavioral Health Treatment Team (Signed)
(  [id number]) Shift Assessment      Upon meeting patient, patient roaming in room. Patient denied SI,HI and AVH. Patient endorsed Anxiety at a 6/10. Patient stated I'm just a little nervous about what's going to happen when I get out. Like, I just am anticipating it. I am happy about leaving though. Patient denied Depression. Patient stated Its been a long time but I am actually feeling no depression.  Patient denied Pain. Patient is calm and cooperative. Patient scored No Risk on the C-SSRS frequent screening. Patients facial expression is Brightened.  Patients gait is steady. Patient is friendly and pleasant. Patient is meal and mediation compliant. Patient is Ax0x4. Patients eye contact is good. Patient is looking forward to discharge.      Q15 checks in place to ensure patient safety.   Plan of care is ongoing.

## 2023-12-22 NOTE — Behavioral Health Treatment Team (Signed)
 Behavioral Health Transition Record    Patient Name: Bradley [name]  Date of Birth: 1965-04-09   Medical Record Number: [id number]  Date of Admission: 12/18/2023 12:55 PM   Date of Discharge: 12/22/23      Attending Provider: Ali, Zaffar M, MD   Discharging Provider: Ali, Zaffar M., MD    To contact this individual call [phone] and ask the operator to page.  If unavailable, ask to be transferred to [facility] Provider on call.  A Behavioral Health Provider will be available on call 24/7 and during holidays.    Primary Care Provider: No primary care provider on file.    No Known Allergies    Reason for Admission: The patient is a 58 year old Caucasian man, who is currently admitted after being transferred to us  from [facility]. He had reported there with a history of having expressed suicidal ideation and depressed mood. He reports that he has been drinking quite heavily and in the past week, he thinks he had almost 36 beers every day. He states he has had one withdrawal seizure in the past, although he cannot remember when this was. States that he was discharged from [facility] about a week ago and did not take any of his medications including Depakote and Celexa. He reports that he was feeling depressed, hopeless and having thoughts of cutting himself with a knife. He also uses cocaine, but states he uses it rarely and last used it 3 days ago. Urine drug screen was negative and blood alcohol level was 330. Denies use of any other recreational substances. States that he is feeling depressed and hopeless today and also feels paranoid that somebody is watching him. Nursing staff reports that he has been somewhat irritable since his arrival on the milieu, but has not been agitated or aggressive. He was calm and cooperative during the interview.     10/01 - [name] is doing well today. He slept well last night and his mood is so much better. Denies any AH or VH. Denies any SI or plan. At the  present time the patient Bradley [name] remains compliant with taking medications. Denies any adverse events from taking them and feels they have been beneficial.         9/30 - [name] slept 8 hours last night and is minimally better. He remains depressed and hopeless. Frequency of SI has reduced today. At the present time the patient Bradley [name] remains compliant with taking medications. Denies any adverse events from taking them and feels they have been beneficial. Denies any AH or VH.        Admission Diagnosis: Mood disorder [F39]    * No surgery found *    Results for orders placed or performed during the hospital encounter of 12/18/23   Valproic Acid Level, Total   Result Value Ref Range    Valproic Acid 42 (L) 50 - 100 ug/ml       Immunizations administered during this encounter:   There is no immunization history on file for this patient.  Influenza Vaccination Status: None of the above/Not documented/Unable to determine from medical record documentation    Screening for Metabolic Disorders for Patients on Antipsychotic Medications  (Data obtained from the EMR)    Estimated Body Mass Index  Body mass index is 25.64 kg/m.      Vital Signs/Blood Pressure  BP 137/75   Pulse 70   Temp 97.8 F (36.6 C) (Oral)  Resp 16   Ht 1.727 m (5' 8)   Wt 76.5 kg (168 lb 10.4 oz)   SpO2 100%   BMI 25.64 kg/m      Fasting Blood Glucose or Hemoglobin A1c  No results found for: GLU, GLUCPOC    No results found for: LABA1C, HBA1CPOC    Discharge Diagnosis: Mood disorder     Discharge Plan/Destination:   DISCHARGE SUMMARY     NAME:Bradley [name]  DOB:   Apr 01, 1965  MRN:   [id number]     The patient Bradley [name] exhibits the ability to control behavior in a less restrictive environment.  Patient's level of functioning is improving.  No assaultive/destructive behavior has been observed for the past 24 hours.  No suicidal/homicidal threat or behavior has been observed for the past 24 hours.  There is  no evidence of serious medication side effects.  Patient has not been in physical or protective restraints for at least the past 24 hours.     If weapons involved, how are they secured? None involved     Is patient aware of and in agreement with discharge plan? Yes     Arrangements for medication:  Prescriptions sent to pharmacy     Copy of discharge instructions to provider?:  Yes     Arrangements for transportation home:  Lyft     Keep all follow up appointments as scheduled, continue to take prescribed medications per physician instructions.  Mental health crisis number:  911 or your local mental health crisis line number at [phone]        Mental Health Emergency WARM LINE      1-866-400-MHAV [phone])      M-F: 9am to 9pm      Sat & Sun: 5pm - 9pm  National suicide prevention lines:                             1-800-SUICIDE (567)406-0367)       1-800-273-TALK (816)691-0631)   24/7 Crisis Text Line:  Text HOME to (657) [phone]         Discharge Medication List and Instructions:      Medication List        ASK your doctor about these medications      citalopram 10 MG tablet  Commonly known as: CELEXA     Depakote 250 MG DR tablet  Generic drug: divalproex     hydrOXYzine HCl 50 MG tablet  Commonly known as: ATARAX     ibuprofen  600 MG tablet  Commonly known as: ADVIL ;MOTRIN   Take 1 tablet by mouth every 6 hours as needed for Pain or Fever     melatonin 3 MG Tabs tablet     nicotine 21 MG/24HR  Commonly known as: NICODERM CQ              Unresulted Labs (24h ago, onward)      None            To obtain results of studies pending at discharge, please contact [phone] option 4.      Follow-up Information       Follow up With Specialties Details Why Contact Info    [facility]  Go to Rapid Access is how you start services at [facility] and are assigned to your ongoing service provider. [facility] provides ongoing mental health and substance use treatment services.     You may choose to complete  a  phone screening first. You may call [phone] to start that process.     Rapid Access Hours     Monday - Friday, 8 am - 2 pm.      Location      Rapid Access services are available at the main [place] office located at [address], [place], [place] [address].     What to Bring     Photo identification     Proof of [facility] residency (i.e. postmarked mail, utility bill, lease agreement)     Insurance card, if applicable     Proof of income (i.e. social services determination letter, W-2, paystub)     Medication bottles, if available     Additional documentation you may have regarding your needs (i.e. hospital discharge information, probation referral, other provider referral). This information can also be faxed directly from providers to [phone]. [address]  [place]  [address]  [phone]    [facility]  Go to [facility] Day Access program is the initial point of contact for information and service requests for a wide range of mental health services and programs. Our services include:     Information and referral     Linking to treatment providers     Referrals to other agencies and community partners     For more information, call our Intake staff at [phone].     Intake Hours     Monday-Friday: 8 a.m. - 4:30 p.m.     Assessment Hours     Monday: 8 a.m. - 2 p.m.     Tuesday: 8 a.m. - 4 p.m.     Wednesday: 8 a.m. - 4 p.m.     Thursday: 8 a.m. - 4 p.m.     Friday: 8 a.m. - 2 p.m.     Please Bring:     Photo ID     Social security number     Insurance card     Applicable co-payment     Proof of all forms of household income     Legal guardian and custody documentation (if applicable)     List of all medications, dosages and prescribing doctor     Name of physical health conditions     Name, address and phone number for an emergency contact person [address] [place]  [address]  [phone]             Advanced Directive:   Does the patient have an  appointed surrogate decision maker? No  Does the patient have a Medical Advance Directive? No  Does the patient have a Psychiatric Advance Directive? No  If the patient does not have a surrogate or Medical Advance Directive AND Psychiatric Advance Directive, the patient was offered information on these advance directives Patient declined to complete    Patient Instructions: Please continue all medications until otherwise directed by physician.      Tobacco Cessation Discharge Plan:   Is the patient a tobacco user  and needs referral for tobacco cessation? No  Patient referred to the following for tobacco cessation with an appointment? No  Patient was offered medication to assist with tobacco cessation at discharge? No    Alcohol/Substance Abuse Discharge Plan:   Does the patient have a history of substance/alcohol abuse and requires a referral for treatment? Yes  Patient referred to the following for substance/alcohol abuse treatment with an appointment? Patient refused  Patient was offered medication to  assist with substance/alcohol abuse cessation at discharge? Patient refused      Patient discharged to: Home; Transition record discussed with patient/caregiver and provided this record in hard copy or electronically

## 2023-12-22 NOTE — Plan of Care (Signed)
 Problem: Discharge Planning  Goal: Discharge to home or other facility with appropriate resources  12/22/2023 1032 by McCray, Sincere  Outcome: Progressing  12/21/2023 2242 by Sammie Cables, RN  Outcome: Progressing  12/21/2023 2240 by Sammie Cables, RN  Outcome: Progressing     Problem: Seizure Precautions  Goal: Remains free of injury related to seizures activity  Outcome: Progressing     Problem: Self Harm/Suicidality  Goal: Will have no self-injury during hospital stay  Description: INTERVENTIONS:  1.  Ensure constant observer at bedside with Q15M safety checks  2.  Maintain a safe environment  3.  Secure patient belongings  4.  Ensure family/visitors adhere to safety recommendations  5.  Ensure safety tray has been added to patient's diet order  6.  Every shift and PRN: Re-assess suicidal risk via Frequent Screener    Outcome: Progressing     Problem: Depression/Self Harm  Goal: Effect of psychiatric condition will be minimized and patient will be protected from self harm  Description: INTERVENTIONS:  1. Assess impact of patient's symptoms on level of functioning, self care needs and offer support as indicated  2. Assess patient/family knowledge of depression, impact on illness and need for teaching  3. Provide emotional support, presence and reassurance  4. Assess for possible suicidal thoughts or ideation. If patient expresses suicidal thoughts or statements do not leave alone, initiate Suicide Precautions, move to a room close to the nursing station and obtain sitter  5. Initiate consults as appropriate with Mental Health Professional, Spiritual Care, Psychosocial CNS, and consider a recommendation to the LIP for a Psychiatric Consultation  Outcome: Progressing       Problem: Safety - Adult  Goal: Free from fall injury  Outcome: Progressing

## 2023-12-22 NOTE — Group Note (Signed)
 Group Therapy Note     Date: 12/22/23     Group Start Time: 1100    Group End Time: 1145    Group Topic: Psychoeducation DBT    Unit: [facility] General BH Unit    Number of Participants: 3     Census: 15    Patient's Goal/Task: The goal of the group is to provide patients with psycho-education about different behavioral health topics. The purpose of this group is to use the recovery model for mental health to allow patients to gain the tools to foster success in the community. Evidence indicates that patients who participate in group therapy in combination with medication have better outcomes. The following SAMSHA principles of recovery are guided by the following framework; recovery is: person driven, occurs through many pathways, is holistic, is supported by peers and allies, is supported through relationships and social networks, is culturally based and influenced, uses a strengths based perspective, is based on respect and emerges from hope.     Focus of Today: The focus of today's group is Distress Tolerance with DBT Skills: Writer provided psychoeducation on radical acceptance, self-soothe with senses and distraction using ACCEPTS acronym. Writer provided handout on distress tolerance from therapistaid.com and encouraged patients to discuss their thoughts and emotions around distress tolerance and identify situations in which the Distress Tolerance skills can be used. Per Therapist Aid In DBT, distress tolerance skills are one approach to coping with uncomfortable emotions. Rather than trying to change or fix a feeling, the goal of distress tolerance is to simply get through it. This approach can be especially powerful for feelings that are unavoidable, or where acting upon a feeling might have severe consequences (e.g., addictions & relapse). This Child psychotherapist provided psychoeducation and allowed for patients to process the psychoeducation with this Child psychotherapist during group     Individual participation:  Clinical research associate invited patient to attend, the patient declined.       Name and Credentials: Vernell Admire, MSW, Supervisee in Social Work    Supervisor: Cy Boas, LCSW

## 2023-12-22 NOTE — Progress Notes (Signed)
 Pt was observed in the milieu watching tv and interacting with their peers. Pt was pleasant upon approach. Pt presented alert and oriented x4, broad affect, mood euthymic. Pt was able to communicate their needs. Pt denied SI/HI/AVH, anxiety, and pain. Pt endorsed their depression level at 8/10. Pt was compliant with their scheduled meds. Pt was able to name scheduled meds and verbalize the reason for taking med. Pt also received Trazodone for sleep. Pt reported having a bowel movement yesterday. Pt accepted offered fluids and evening snack. Pt voiced no other concerns at this time. Pt was noted attending to personal hygiene. They appeared to have slept approx 8 hours during the night. Pt was cooperative with venipuncture for morning labs. No acute distress observed. Monitoring for safety and behaviors continues.

## 2023-12-22 NOTE — Progress Notes (Signed)
 Pharmacist Discharge Medication Reconciliation    Discharging Provider: Dr. Hildegard    Discharge Medications:   Current Discharge Medication List        START taking these medications    Details   traZODone (DESYREL) 50 MG tablet Take 1 tablet by mouth nightly as needed for Sleep  Qty: 30 tablet, Refills: 0           CONTINUE these medications which have CHANGED    Details   hydrOXYzine HCl (ATARAX) 50 MG tablet Take 1 tablet by mouth 3 times daily as needed for Anxiety  Qty: 60 tablet, Refills: 0      divalproex (DEPAKOTE) 500 MG DR tablet Take 1 tablet by mouth in the morning and at bedtime  Qty: 90 tablet, Refills: 0      citalopram (CELEXA) 10 MG tablet Take 1 tablet by mouth daily  Qty: 30 tablet, Refills: 0           STOP taking these medications       melatonin 3 MG TABS tablet Comments:   Reason for Stopping:         nicotine (NICODERM CQ) 21 MG/24HR Comments:   Reason for Stopping:         ibuprofen  (ADVIL ;MOTRIN ) 600 MG tablet Comments:   Reason for Stopping:               The patient's chart, MAR and AVS were reviewed by Rumalda Louisa Lacy, [facility]

## 2024-01-17 ENCOUNTER — Emergency Department: Admit: 2024-01-17 | Payer: Medicaid (Managed Care)

## 2024-01-17 ENCOUNTER — Inpatient Hospital Stay
Admission: EM | Admit: 2024-01-17 | Discharge: 2024-01-23 | Disposition: A | Discharge status: Still In Hospital | Payer: Medicaid (Managed Care) | Admitting: Internal Medicine

## 2024-01-17 DIAGNOSIS — I4891 Unspecified atrial fibrillation: Secondary | ICD-10-CM

## 2024-01-17 DIAGNOSIS — I480 Paroxysmal atrial fibrillation: Secondary | ICD-10-CM

## 2024-01-17 LAB — CBC WITH AUTO DIFFERENTIAL
Basophils %: 0 % (ref 0–1)
Basophils Absolute: 0 K/UL (ref 0.0–0.1)
Eosinophils %: 0 % (ref 0–7)
Eosinophils Absolute: 0 K/UL (ref 0.0–0.4)
Hematocrit: 47.1 % (ref 36.6–50.3)
Hemoglobin: 16.8 g/dL (ref 12.1–17.0)
Immature Granulocytes %: 0 %
Immature Granulocytes Absolute: 0 K/UL
Lymphocytes %: 40 % (ref 12–49)
Lymphocytes Absolute: 5.56 K/UL — ABNORMAL HIGH (ref 0.8–3.5)
MCH: 33.8 pg (ref 26.0–34.0)
MCHC: 35.7 g/dL (ref 30.0–36.5)
MCV: 94.8 FL (ref 80.0–99.0)
MPV: 9.8 FL (ref 8.9–12.9)
Monocytes %: 10 % (ref 5–13)
Monocytes Absolute: 1.39 K/UL — ABNORMAL HIGH (ref 0.0–1.0)
Neutrophils %: 50 % (ref 32–75)
Neutrophils Absolute: 6.95 K/UL (ref 1.8–8.0)
Nucleated RBCs: 0 /100{WBCs}
Platelets: 306 K/uL (ref 150–400)
RBC: 4.97 M/uL (ref 4.10–5.70)
RDW: 13.8 % (ref 11.5–14.5)
WBC Comment: REACTIVE
WBC: 13.9 K/uL — ABNORMAL HIGH (ref 4.1–11.1)
nRBC: 0 K/uL (ref 0.00–0.01)

## 2024-01-17 LAB — URINALYSIS WITH MICROSCOPIC
BACTERIA, URINE: NEGATIVE /HPF
Bilirubin, Urine: NEGATIVE
Blood, Urine: NEGATIVE
Glucose, Ur: NEGATIVE mg/dL
Ketones, Urine: NEGATIVE mg/dL
Leukocyte Esterase, Urine: NEGATIVE
Nitrite, Urine: NEGATIVE
Protein, UA: NEGATIVE mg/dL
Specific Gravity, UA: <1.005 (ref 1.003–1.030)
Urobilinogen, Urine: 0.2 EU/dL (ref 0.2–1.0)
pH, Urine: 6 (ref 5.0–8.0)

## 2024-01-17 LAB — COMPREHENSIVE METABOLIC PANEL
ALT: 22 U/L (ref 10–50)
AST: 36 U/L (ref 10–50)
Albumin/Globulin Ratio: 1.3 (ref 1.1–2.2)
Albumin: 4.9 g/dL (ref 3.5–5.2)
Alk Phosphatase: 92 U/L (ref 40–129)
Anion Gap: 16 mmol/L — ABNORMAL HIGH (ref 2–12)
BUN/Creatinine Ratio: 18 (ref 12–20)
BUN: 9 mg/dL (ref 6–20)
CO2: 27 mmol/L (ref 22–29)
Calcium: 9.3 mg/dL (ref 8.6–10.0)
Chloride: 99 mmol/L (ref 98–107)
Creatinine: 0.49 mg/dL — ABNORMAL LOW (ref 0.70–1.20)
Est, Glom Filt Rate: >90 ml/min/1.73m2 (ref >60)
Globulin: 3.8 g/dL (ref 2.0–4.0)
Glucose: 91 mg/dL (ref 65–100)
Potassium: 4.4 mmol/L (ref 3.5–5.1)
Sodium: 142 mmol/L (ref 136–145)
Total Bilirubin: 0.3 mg/dL (ref 0.0–1.2)
Total Protein: 8.7 g/dL — ABNORMAL HIGH (ref 6.4–8.3)

## 2024-01-17 LAB — EXTRA TUBES HOLD

## 2024-01-17 LAB — ETHANOL: Ethanol Lvl: 315 mg/dL — ABNORMAL HIGH (ref <10)

## 2024-01-17 LAB — URINE CULTURE HOLD SAMPLE

## 2024-01-17 LAB — MAGNESIUM: Magnesium: 2.3 mg/dL (ref 1.6–2.6)

## 2024-01-17 MED ORDER — LORAZEPAM 2 MG/ML IJ SOLN
2 | INTRAMUSCULAR | Status: DC | PRN
Start: 2024-01-17 — End: 2024-01-20

## 2024-01-17 MED ORDER — LORAZEPAM 1 MG PO TABS
1 | ORAL | Status: DC | PRN
Start: 2024-01-17 — End: 2024-01-20

## 2024-01-17 MED ORDER — SODIUM CHLORIDE 0.9 % IV SOLN
0.9 | INTRAVENOUS | Status: DC | PRN
Start: 2024-01-17 — End: 2024-01-23

## 2024-01-17 MED ORDER — DIVALPROEX SODIUM 250 MG PO TBEC
250 | Freq: Once | ORAL | Status: AC
Start: 2024-01-17 — End: 2024-01-17
  Administered 2024-01-18: 500 mg via ORAL

## 2024-01-17 MED ORDER — THIAMINE MONONITRATE 100 MG PO TABS
100 | Freq: Every day | ORAL | Status: DC
Start: 2024-01-17 — End: 2024-01-23
  Administered 2024-01-18 – 2024-01-23 (×7): 100 mg via ORAL

## 2024-01-17 MED ORDER — NORMAL SALINE FLUSH 0.9 % IV SOLN
0.9 | INTRAVENOUS | Status: DC | PRN
Start: 2024-01-17 — End: 2024-01-23

## 2024-01-17 MED ORDER — NORMAL SALINE FLUSH 0.9 % IV SOLN
0.9 | Freq: Two times a day (BID) | INTRAVENOUS | Status: DC
Start: 2024-01-17 — End: 2024-01-23
  Administered 2024-01-17 – 2024-01-23 (×8): 10 mL via INTRAVENOUS

## 2024-01-17 MED ORDER — SODIUM CHLORIDE 0.9 % IV BOLUS
0.9 | Freq: Once | INTRAVENOUS | Status: AC
Start: 2024-01-17 — End: 2024-01-17
  Administered 2024-01-17: 1000 mL via INTRAVENOUS

## 2024-01-17 MED FILL — DIVALPROEX SODIUM 250 MG PO TBEC: 250 mg | ORAL | Qty: 2 | Fill #0

## 2024-01-17 MED FILL — BD POSIFLUSH 0.9 % IV SOLN: 0.9 % | INTRAVENOUS | Qty: 40 | Fill #0

## 2024-01-17 MED FILL — VITAMIN B-1 100 MG PO TABS: 100 mg | ORAL | Qty: 1 | Fill #0

## 2024-01-17 MED FILL — SODIUM CHLORIDE 0.9 % IV SOLN: 0.9 % | INTRAVENOUS | Qty: 1000 | Fill #0

## 2024-01-17 NOTE — ED Notes (Signed)
"  Report given to DW RN. Care signed off.   "

## 2024-01-17 NOTE — ED Triage Notes (Signed)
"  Pt BIBA w/ c/o fall and hitting head. Pt endorses drinking alcohol. Pt endorses weakness in bilateral legs.     Pt has hx of seizures. Pt endorses depakote  prescription. Did not take today.   "

## 2024-01-17 NOTE — ED Notes (Signed)
"  [facility] Transport on site.  "

## 2024-01-17 NOTE — ED Notes (Signed)
"  Verbal and bedside report given to [facility]. Opportunity for questions and clarification was provided.   "

## 2024-01-17 NOTE — ED Provider Notes (Addendum)
 "      [facility] EMERGENCY DEPARTMENT  EMERGENCY DEPARTMENT ENCOUNTER      Pt Name: Bradley [name]  MRN: [id number]  Birthdate [birth year]  Date of evaluation: 01/17/2024  Provider: Sherline Party, MD    CHIEF COMPLAINT       Chief Complaint   Patient presents with    Head Injury    Fall         HISTORY OF PRESENT ILLNESS   (Location/Symptom, Timing/Onset, Context/Setting, Quality, Duration, Modifying Factors, Severity)  Note limiting factors.   Patient is a 58 year old who comes into the emergency department after a fall with a head injury.  He has a history of alcohol abuse and seizures and has been drinking today, he also missed his dose of Depakote .  The patient is unsure of the circumstances surrounding his fall, he believes he may have had a seizure but has also had weakness to his bilateral legs.  The patient did hit his head when he fell earlier today.  He also reports that he feels generally unwell and like he does not want to live.    The history is provided by the patient.         Review of External Medical Records:     Nursing Notes were reviewed.    REVIEW OF SYSTEMS    (2-9 systems for level 4, 10 or more for level 5)     Review of Systems    Except as noted above the remainder of the review of systems was reviewed and negative.       PAST MEDICAL HISTORY     Past Medical History:   Diagnosis Date    Abuse, drug or alcohol (HCC)     alcohol abuse         SURGICAL HISTORY     No past surgical history on file.      CURRENT MEDICATIONS       Previous Medications    CITALOPRAM  (CELEXA ) 10 MG TABLET    Take 1 tablet by mouth daily    DIVALPROEX  (DEPAKOTE ) 500 MG DR TABLET    Take 1 tablet by mouth in the morning and at bedtime    TRAZODONE  (DESYREL ) 50 MG TABLET    Take 1 tablet by mouth nightly as needed for Sleep       ALLERGIES     Aspirin    FAMILY HISTORY     No family history on file.       SOCIAL HISTORY       Social History     Socioeconomic History    Marital status: Widowed   Tobacco Use    Smoking  status: Every Day     Current packs/day: 1.00     Average packs/day: 1 pack/day for 10.8 years (10.8 ttl pk-yrs)     Types: Cigarettes     Start date: 2015     Passive exposure: Current   Substance and Sexual Activity    Alcohol use: Yes     Alcohol/week: 40.0 - 70.0 standard drinks of alcohol     Types: 40 - 70 Standard drinks or equivalent per week    Drug use: Not Currently     Social Drivers of Health     Financial Resource Strain: High Risk (11/23/2022)    Received from [facility]    Overall Financial Resource Strain (CARDIA)     Difficulty of Paying Living Expenses: Very hard   Food Insecurity: Food  Insecurity Present (12/18/2023)    Hunger Vital Sign     Worried About Running Out of Food in the Last Year: Often true     Ran Out of Food in the Last Year: Often true   Transportation Needs: Unmet Transportation Needs (12/18/2023)    PRAPARE - Therapist, Art (Medical): Yes     Lack of Transportation (Non-Medical): Yes   Physical Activity: Inactive (11/23/2022)    Received from [facility]    Exercise Vital Sign     On average, how many days per week do you engage in moderate to strenuous exercise (like a brisk walk)?: 0 days     On average, how many minutes do you engage in exercise at this level?: 0 min   Stress: Stress Concern Present (11/23/2022)    Received from [facility]    [facility] of Occupational [facility] - Occupational Stress Questionnaire     Feeling of Stress : Very much   Social Connections: Socially Isolated (11/23/2022)    Received from [facility]    Social Connection and Isolation Panel     In a typical week, how many times do you talk on the phone with family, friends, or neighbors?: More than three times a week     How often do you get together with friends or relatives?: Three times a week     How often do you attend church or religious services?: Never     Do you  belong to any clubs or organizations such as church groups, unions, fraternal or athletic groups, or school groups?: No     How often do you attend meetings of the clubs or organizations you belong to?: Never     Are you married, widowed, divorced, separated, never married, or living with a partner?: Widowed   Housing Stability: High Risk (12/18/2023)    Housing Stability Vital Sign     Unable to Pay for Housing in the Last Year: Yes     Number of Times Moved in the Last Year: 3     Homeless in the Last Year: Yes           PHYSICAL EXAM    (up to 7 for level 4, 8 or more for level 5)     ED Triage Vitals [01/17/24 1850]   BP Girls Systolic BP Percentile Girls Diastolic BP Percentile Boys Systolic BP Percentile Boys Diastolic BP Percentile Temp Temp Source Pulse   (!) 144/65 -- -- -- -- 97.8 F (36.6 C) Oral 97      Respirations SpO2 Height Weight       18 98 % -- --           Body mass index is 24.28 kg/m.    Physical Exam  Vitals and nursing note reviewed.   Constitutional:       Appearance: He is well-developed. He is ill-appearing.   HENT:      Head: Normocephalic and atraumatic.   Cardiovascular:      Rate and Rhythm: Regular rhythm. Tachycardia present.   Pulmonary:      Effort: Pulmonary effort is normal.      Breath sounds: Normal breath sounds.   Abdominal:      Palpations: Abdomen is soft.      Tenderness: There is no abdominal tenderness. There is no guarding or rebound.   Musculoskeletal:      Right lower  leg: No edema.      Left lower leg: No edema.   Skin:     General: Skin is warm and dry.   Neurological:      General: No focal deficit present.      Mental Status: He is alert and oriented to person, place, and time.   Psychiatric:         Mood and Affect: Mood normal.         Speech: Speech is slurred.         Behavior: Behavior is slowed.         Thought Content: Thought content includes suicidal ideation.         DIAGNOSTIC RESULTS     EKG: All EKG's are interpreted by the Emergency Department  Physician who either signs or Co-signs this chart in the absence of a cardiologist.        RADIOLOGY:   Non-plain film images such as CT, Ultrasound and MRI are read by the radiologist. Plain radiographic images are visualized and preliminarily interpreted by the emergency physician with the below findings:        Interpretation per the Radiologist below, if available at the time of this note:    CT CERVICAL SPINE WO CONTRAST   Final Result   Multilevel spondylosis without acute abnormality.      Electronically signed by DARICE COLON      CT HEAD WO CONTRAST   Final Result   No acute process or change compared to the prior exam.            Electronically signed by DARICE COLON      XR CHEST PORTABLE   Final Result   No acute cardiopulmonary abnormalities.         Electronically signed by Norval Husky           LABS:  Labs Reviewed   CBC WITH AUTO DIFFERENTIAL - Abnormal; Notable for the following components:       Result Value    WBC 13.9 (*)     Lymphocytes Absolute 5.56 (*)     Monocytes Absolute 1.39 (*)     All other components within normal limits   COMPREHENSIVE METABOLIC PANEL - Abnormal; Notable for the following components:    Anion Gap 16 (*)     Creatinine 0.49 (*)     Total Protein 8.7 (*)     All other components within normal limits   ETHANOL - Abnormal; Notable for the following components:    Ethanol Lvl 315 (*)     All other components within normal limits   URINE DRUG SCREEN - Abnormal; Notable for the following components:    Barbiturates, Urine Positive (*)     All other components within normal limits   URINE CULTURE HOLD SAMPLE   COVID-19 & INFLUENZA COMBO   EXTRA TUBES HOLD   MAGNESIUM    URINALYSIS WITH MICROSCOPIC       All other labs were within normal range or not returned as of this dictation.    EMERGENCY DEPARTMENT COURSE and DIFFERENTIAL DIAGNOSIS/MDM:   Vitals:    Vitals:    01/17/24 2045 01/17/24 2049 01/17/24 2050 01/17/24 2055   BP: 127/69  127/69    Pulse: (!) 127 (!) 134     Resp:  15 19     Temp:       TempSrc:       SpO2: 96%      Weight:  81.2 kg (179 lb)   Height:    1.829 m (6')           Medical Decision Making  Amount and/or Complexity of Data Reviewed  Labs: ordered.  Radiology: ordered.  ECG/medicine tests: ordered. Decision-making details documented in ED Course.    Risk  OTC drugs.  Prescription drug management.  Decision regarding hospitalization.        Patient presents with a fall and head injury in the setting of alcohol abuse, history of seizure with noncompliance with his antiepileptics, and new onset atrial fibrillation with rapid ventricular rate.  The history, exam, diagnostic testing (if any) and the patient's current condition do not suggest STEMI, meningitis, stroke, sepsis, subarachnoid hemorrhage, intracranial bleeding, encephalitis.  Syncope related to his atrial fibrillation with RVR as a possible etiology as well as seizure or alcohol intoxication.    Patient is being admitted to the hospital.  The results of their tests and reasons for their admission have been discussed with them and/or available family.  They convey agreement and understanding for the need to be admitted and for their admission diagnosis.  Consultation will be made now with the inpatient physician for hospitalization.    Exclusion criteria - the patient is NOT to be included for SEP-1 Core Measure due to: Infection is not suspected      REASSESSMENT     ED Course as of 01/17/24 2101   Tue Jan 17, 2024   2005 EKG 12 Lead  Atrial fibrillation with rapid ventricular response, 113 bpm, normal axis, no STEMI.    Patient reports that he has a history of an irregular heartbeat but chart review does not demonstrate any diagnosis of atrial fibrillation in the past or any need for rate control.  Metoprolol  was ordered in the emergency department.  Anticoagulation was considered; however, as the patient is a fall risk with his alcohol abuse and seizure history no anticoagulation will be initiated at this  time. [IO]      ED Course User Index  [IO] Shearon Sherline LABOR, MD           CONSULTS:  IP CONSULT TO SOCIAL WORK  IP CONSULT TO TELE-PSYCH (SOCIAL WORK ONLY)  IP CONSULT TO TELE-PSYCH (SOCIAL WORK ONLY)  IP CONSULT TO BSMART    PROCEDURES:  Unless otherwise noted below, none     Procedures      FINAL IMPRESSION      1. Atrial fibrillation with RVR (HCC)    2. Closed head injury, initial encounter    3. Alcohol abuse    4. Suicidal ideation          DISPOSITION/PLAN   DISPOSITION Decision To Admit 01/17/2024 09:01:02 PM      Perfect Serve Consult for Admission  9:04 PM    ED Room Number: C09/C09  Patient Name and age:  YANIXAN MELLINGER 58 y.o.  male  Working Diagnosis:   1. Atrial fibrillation with RVR (HCC)    2. Closed head injury, initial encounter    3. Alcohol abuse    4. Suicidal ideation        COVID-19 Suspicion: No  Sepsis present:  No  Reassessment needed: No  Code Status:  Full Code  Readmission: No  Isolation Requirements: no  Recommended Level of Care: telemetry  Department: [facility] ED - [phone]      Sherline LABOR Shearon, MD has spent 35 minutes of critical care time involved in lab review, consultations with specialist,  family decision-making, and documentation.  During this entire length of time I was immediately available to the patient.    Critical Care:  The reason for providing this level of medical care for this critically ill patient was due a critical illness that impaired one or more vital organ systems such that there was a high probability of imminent or life threatening deterioration in the patients condition. This care involved high complexity decision making to assess, manipulate, and support vital system functions, to treat this degreee vital organ system failure and to prevent further life threatening deterioration of the patients condition.       (Please note that portions of this note were completed with a voice recognition program.  Efforts were made to edit the dictations but  occasionally words are mis-transcribed.)    Sherline Party, MD (electronically signed)  Emergency Attending Physician / Physician Assistant / Nurse Practitioner              Party Sherline LABOR, MD  01/17/24 2107       Party Sherline LABOR, MD  01/17/24 2107    "

## 2024-01-17 NOTE — Virtual Health (Signed)
"  Reason for Cancel: TelePsych Reason for Cancel: Patient impaired, Patient already admitted to medical unit, no longer in ED. Chart Review notes Inpatient consult to Psychiatry ordered.     The patient was located at Facility (Appt Department): [facility]  [facility] B3 INTERMEDIATE CARE UNIT  [address] [place]  [place] [address]  Loc: [phone]  The provider was located at Home (City/State): [place]   Confirm you are appropriately licensed, registered, or certified to deliver care in the state where the patient is located as indicated above. If you are not or unsure, please re-schedule the visit: Yes, I confirm.   [place] Consult to [employer]  Consult performed by: Gailen Toribio CROME, APRN - CNP  Consult ordered by: Shearon Sherline LABOR, MD  Reason for consult: Suicidal/Risk to Self           Total time spent on this encounter: Not billed by time    --Toribio CROME Gailen, APRN - CNP on 01/17/2024 at 11:58 PM    An electronic signature was used to authenticate this note.    "

## 2024-01-17 NOTE — ED Notes (Signed)
"  TRANSFER - OUT REPORT:    Verbal report given to Schneider, RN on Bradley [name]  being transferred to [facility] at [facility] for routine progression of patient care       Report consisted of patient's Situation, Background, Assessment and   Recommendations(SBAR).     Information from the following report(s) Nurse Handoff Report, Index, ED Encounter Summary, ED SBAR, Adult Overview, Intake/Output, MAR, Recent Results, Cardiac Rhythm A Fib, and Neuro Assessment was reviewed with the receiving nurse.    Kinder Fall Assessment:    Presents to emergency department  because of falls (Syncope, seizure, or loss of consciousness): Yes  Age > 70: No  Altered Mental Status, Intoxication with alcohol or substance confusion (Disorientation, impaired judgment, poor safety awaremess, or inability to follow instructions): Yes  Impaired Mobility: Ambulates or transfers with assistive devices or assistance; Unable to ambulate or transer.: Yes  Nursing Judgement: Yes          Lines:   Peripheral IV 01/17/24 Right;Anterior Forearm (Active)   Site Assessment Clean, dry & intact 01/17/24 1949   Line Status Brisk blood return 01/17/24 1949   Line Care Connections checked and tightened 01/17/24 1949   Phlebitis Assessment No symptoms 01/17/24 1949   Alcohol Cap Used No 01/17/24 1949   Dressing Status Clean, dry & intact 01/17/24 1949        Opportunity for questions and clarification was provided.      Patient transported with:  [facility] Transport  Monitor  "

## 2024-01-17 NOTE — H&P (Signed)
 "  Hospitalist Admission Note    NAME:  Bradley [name]   DOB:  Oct 17, 1965   MRN:  [id number]     Date/Time:  01/17/2024 10:26 PM    Patient PCP: No primary care provider on file.  ________________________________________________________________________    Given the patient's current clinical presentation, I have a high level of concern for decompensation if discharged from the emergency department.  Complex decision making was performed, which includes reviewing the patient's available past medical records, laboratory results, and x-ray films.       My assessment of this patient's clinical condition and my plan of care is as follows.    Assessment / Plan:    [name] is a 58 y.o. male with PMHx alcohol abuse, seizure disorder who presents to the ER with a head injury after loss of consciousness, and is admitted with A-fib with RVR, syncope and collapse, acute alcohol intoxication, possible breakthrough seizure.  Patient states that he drank more than normal attempting to end his life.    # New onset A-fib with RVR  # Secondary hypercoagulable state  POA, possibly triggered by alcohol intoxication.  HR into 130s in the ER, EKG shows A-fib with RVR.  -Started on metoprolol , will continue and titrate as needed  -Cardiology consult  -Treatment dose Lovenox     # Alcohol intoxication in s/o alcohol use disorder  POA, acute intoxication with ethanol level 315 on arrival.  -Encourage abstinence  -CIWA's with Ativan   -Thiamine  daily    # Syncope and collapse  POA, unclear cause, could be from A-fib versus alcohol intoxication versus breakthrough seizure.  CT head and C-spine showed no acute process.  -Neurology consulted, I do not think there would be great benefit from further head imaging, but EEG may be warranted    # Possible breakthrough seizure in s/o seizure disorder  POA, likely due to missed dose of Depakote  and/or alcohol intoxication.  -Continue PTA Depakote   -Neurology consult  -EEG ordered    # Suicide attempt with  suicidal ideation  # Depression with insomnia  POA,, expressing he did not want to live in the ER.  Tells me he drank more than normal trying to end his life.  History of suicide attempt with medication years ago.  -Continue Celexa  and as needed trazodone   -Suicide precautions  -Psych consult  -Patient would benefit from transfer to inpatient psychiatric facility once medically stable    #BMI (Calculated): 24.27          I have personally reviewed the radiographs, laboratory data in Epic and decisions and statements above are based partially on this personal interpretation.    Code Status: Full Code  DVT Prophylaxis: None, bleeding risk given falls  GI Prophylaxis: not indicated       Subjective:   CHIEF COMPLAINT: Fall    HISTORY OF PRESENT ILLNESS:     Bradley [name] is a 58 y.o. male with PMHx alcohol abuse, seizure disorder who presents to the ER with a head injury after loss of consciousness.  He is not sure what happened that led to his fall, thinks he might of had a seizure, and hit his head when he fell.  He has been drinking more than usual today and missed his dose of Depakote .  Overall feels unwell.  Expresses that he does not wish to live.  Says he drink more beer than he normally does in an attempt to end his life.  States that he had a suicide attempt many years  ago with medications, but he drank so much alcohol that he vomited them up at that time.  Says he normally drinks 3 to 4 40 ounce beers daily.    Available records were reviewed at the time of H&P.        Past Medical History:   Diagnosis Date    Abuse, drug or alcohol (HCC)     alcohol abuse      No past surgical history on file.  Social History     Tobacco Use    Smoking status: Every Day     Current packs/day: 1.00     Average packs/day: 1 pack/day for 10.8 years (10.8 ttl pk-yrs)     Types: Cigarettes     Start date: 2015     Passive exposure: Current    Smokeless tobacco: Not on file   Substance Use Topics    Alcohol use: Yes     Alcohol/week:  40.0 - 70.0 standard drinks of alcohol     Types: 40 - 70 Standard drinks or equivalent per week      No family history on file.     Allergies   Allergen Reactions    Aspirin Nausea And Vomiting        Prior to Admission medications   Medication Sig Start Date End Date Taking? Authorizing Provider   divalproex  (DEPAKOTE ) 500 MG DR tablet Take 1 tablet by mouth in the morning and at bedtime 12/22/23   Ali, Zaffar M, MD   citalopram  (CELEXA ) 10 MG tablet Take 1 tablet by mouth daily 12/22/23   Ali, Zaffar M, MD   traZODone  (DESYREL ) 50 MG tablet Take 1 tablet by mouth nightly as needed for Sleep 12/22/23   Ali, Zaffar M, MD       REVIEW OF SYSTEMS:  See HPI for details      Objective:   VITALS:    Vitals:    01/17/24 2200   BP: 102/68   Pulse: 83   Resp: 19   Temp:    SpO2: 95%     PHYSICAL EXAM:    Physical Exam:    General: No acute distress. Alert. Cooperative.  Chronically ill-appearing, appears older than stated age  Head: Normocephalic. Atraumatic.  Mouth: Mucosa pink. Moist mucous membranes.   Respiratory: CTAB. No increased work of breathing. Symmetric chest rise b/l.  Cardiovascular: Tachycardic rate, irregular rhythm. No clubbing or cyanosis  GI: NTND, +BS. No masses.   Extremities: No LE edema. Moving all extremities spontaneously.  Musculoskeletal: Full ROM in all extremities, no obvious deformities. +peripheral pulses.  Skin: Warm, dry. No rashes.   Neuro/psych: Alert, depressed affect. No focal deficit.     Labs:    Recent Labs     01/17/24  1855   WBC 13.9*   HGB 16.8   HCT 47.1   PLT 306     Recent Labs     01/17/24  1855   NA 142   K 4.4   CL 99   CO2 27   BUN 9   MG 2.3   ALT 22     No components found for: GLPOC    No results for input(s): INR in the last 72 hours.    _______________________________________________________________________  Care Plan discussed with:    Comments   Patient x Discussed with patient in room. POC outlined and Questions answered    Family      RN x    Care Manager  Consultant:  x ED MD   _______________________________________________________________________  Recommended Disposition:   Home with Family x   HH/PT/OT/RN    SNF/LTC    [facility]    ________________________________________________________________________  TOTAL TIME:  60 Minutes    I have provided a total of 60 minutes of critical care time.  During this entire length of time I was immediately available to the patient. The reason for providing this level of medical care was due to a critical illness that impaired one or more vital organ systems, such that there was a high probability of imminent or life threatening deterioration in the patient's condition. This care involved high complexity decision making which includes reviewing the patient's past medical records, current laboratory results, and actual Xray films in order to assess, support vital system function, and to treat this degree of vital organ system failure, and to prevent further life threatening deterioration of the patients condition.          Comments   >50% of visit spent in counseling and coordination of care x Chart reviewed  Discussion with patient and/or family and questions answered     ________________________________________________________________________  Signed: Warren LITTIE Olp, MD        Procedures: see electronic medical records for all procedures/Xrays/labs and details which were not copied into this note but were reviewed prior to creation of Plan.    "

## 2024-01-17 NOTE — ED Notes (Signed)
"  Patient transported to and from CT scan on monitor with 1:1 observer.   "

## 2024-01-18 LAB — BASIC METABOLIC PANEL
Anion Gap: 12 mmol/L (ref 2–14)
BUN/Creatinine Ratio: 15 (ref 12–20)
BUN: 11 mg/dL (ref 6–20)
CO2: 26 mmol/L (ref 20–29)
Calcium: 8.5 mg/dL — ABNORMAL LOW (ref 8.6–10.0)
Chloride: 101 mmol/L (ref 98–107)
Creatinine: 0.76 mg/dL (ref 0.70–1.20)
Est, Glom Filt Rate: >90 ml/min/1.73m2 (ref >59)
Glucose: 94 mg/dL (ref 65–100)
Potassium: 3.8 mmol/L (ref 3.5–5.1)
Sodium: 139 mmol/L (ref 136–145)

## 2024-01-18 LAB — CBC WITH AUTO DIFFERENTIAL
Basophils %: 0.7 % (ref 0.0–1.0)
Basophils Absolute: 0.07 K/UL (ref 0.00–0.10)
Eosinophils %: 1.6 % (ref 0.0–7.0)
Eosinophils Absolute: 0.16 K/UL (ref 0.00–0.40)
Hematocrit: 40.7 % (ref 36.6–50.3)
Hemoglobin: 13.8 g/dL (ref 12.1–17.0)
Immature Granulocytes %: 0.3 % (ref 0.0–0.5)
Immature Granulocytes Absolute: 0.03 K/UL (ref 0.00–0.04)
Lymphocytes %: 35.6 % (ref 12.0–49.0)
Lymphocytes Absolute: 3.52 K/UL — ABNORMAL HIGH (ref 0.80–3.50)
MCH: 32.5 pg (ref 26.0–34.0)
MCHC: 33.9 g/dL (ref 30.0–36.5)
MCV: 95.8 FL (ref 80.0–99.0)
MPV: 9.9 FL (ref 8.9–12.9)
Monocytes %: 10 % (ref 5.0–13.0)
Monocytes Absolute: 0.99 K/UL (ref 0.00–1.00)
Neutrophils %: 51.8 % (ref 32.0–75.0)
Neutrophils Absolute: 5.13 K/UL (ref 1.80–8.00)
Nucleated RBCs: 0 /100{WBCs}
Platelets: 264 K/uL (ref 150–400)
RBC: 4.25 M/uL (ref 4.10–5.70)
RDW: 14.1 % (ref 11.5–14.5)
WBC: 9.9 K/uL (ref 4.1–11.1)
nRBC: 0 K/uL (ref 0.00–0.01)

## 2024-01-18 LAB — URINE DRUG SCREEN
Amphetamine, Urine: NEGATIVE
Barbiturates, Urine: POSITIVE — AB
Benzodiazepines, Urine: NEGATIVE
Cocaine, Urine: NEGATIVE
Methadone, Urine: NEGATIVE
Opiates, Urine: NEGATIVE
Phencyclidine, Urine: NEGATIVE
THC, TH-Cannabinol, Urine: NEGATIVE

## 2024-01-18 LAB — COVID-19 & INFLUENZA COMBO
Rapid Influenza A By PCR: NOT DETECTED
Rapid Influenza B By PCR: NOT DETECTED
SARS-CoV-2, PCR: NOT DETECTED

## 2024-01-18 LAB — TROPONIN: Troponin T: 11 ng/L (ref 0–22)

## 2024-01-18 MED ORDER — POLYETHYLENE GLYCOL 3350 17 G PO PACK
17 | Freq: Every day | ORAL | Status: DC | PRN
Start: 2024-01-18 — End: 2024-01-23

## 2024-01-18 MED ORDER — NORMAL SALINE FLUSH 0.9 % IV SOLN
0.9 | Freq: Two times a day (BID) | INTRAVENOUS | Status: DC
Start: 2024-01-18 — End: 2024-01-23
  Administered 2024-01-18 – 2024-01-23 (×8): 10 mL via INTRAVENOUS

## 2024-01-18 MED ORDER — METOPROLOL TARTRATE 25 MG PO TABS
25 | Freq: Two times a day (BID) | ORAL | Status: DC
Start: 2024-01-18 — End: 2024-01-18
  Administered 2024-01-18 (×2): 25 mg via ORAL

## 2024-01-18 MED ORDER — TRAZODONE HCL 50 MG PO TABS
50 | Freq: Every evening | ORAL | Status: DC | PRN
Start: 2024-01-18 — End: 2024-01-23
  Administered 2024-01-18 – 2024-01-23 (×4): 50 mg via ORAL

## 2024-01-18 MED ORDER — QUETIAPINE FUMARATE 25 MG PO TABS
25 | Freq: Every evening | ORAL | Status: DC
Start: 2024-01-18 — End: 2024-01-23
  Administered 2024-01-19 – 2024-01-23 (×5): 50 mg via ORAL

## 2024-01-18 MED ORDER — POTASSIUM CHLORIDE 10 MEQ/100ML IV SOLN
10 | INTRAVENOUS | Status: DC | PRN
Start: 2024-01-18 — End: 2024-01-19

## 2024-01-18 MED ORDER — METOPROLOL TARTRATE 5 MG/5ML IV SOLN
5 | INTRAVENOUS | Status: DC | PRN
Start: 2024-01-18 — End: 2024-01-18
  Administered 2024-01-18: 14:00:00 2.5 mg via INTRAVENOUS

## 2024-01-18 MED ORDER — ONDANSETRON 4 MG PO TBDP
4 | Freq: Three times a day (TID) | ORAL | Status: DC | PRN
Start: 2024-01-18 — End: 2024-01-23

## 2024-01-18 MED ORDER — ACETAMINOPHEN 650 MG RE SUPP
650 | Freq: Four times a day (QID) | RECTAL | Status: DC | PRN
Start: 2024-01-18 — End: 2024-01-23

## 2024-01-18 MED ORDER — CITALOPRAM HYDROBROMIDE 20 MG PO TABS
20 | Freq: Every day | ORAL | Status: DC
Start: 2024-01-18 — End: 2024-01-18
  Administered 2024-01-18: 13:00:00 10 mg via ORAL

## 2024-01-18 MED ORDER — NORMAL SALINE FLUSH 0.9 % IV SOLN
0.9 | INTRAVENOUS | Status: DC | PRN
Start: 2024-01-18 — End: 2024-01-23

## 2024-01-18 MED ORDER — DIVALPROEX SODIUM 125 MG PO TBEC
125 | Freq: Two times a day (BID) | ORAL | Status: DC
Start: 2024-01-18 — End: 2024-01-23
  Administered 2024-01-18 – 2024-01-23 (×11): 500 mg via ORAL

## 2024-01-18 MED ORDER — POTASSIUM BICARB-CITRIC ACID 20 MEQ PO TBEF
20 | ORAL | Status: DC | PRN
Start: 2024-01-18 — End: 2024-01-19

## 2024-01-18 MED ORDER — ONDANSETRON HCL 4 MG/2ML IJ SOLN
4 | Freq: Four times a day (QID) | INTRAMUSCULAR | Status: DC | PRN
Start: 2024-01-18 — End: 2024-01-23

## 2024-01-18 MED ORDER — LACTATED RINGERS IV SOLN
INTRAVENOUS | Status: DC
Start: 2024-01-18 — End: 2024-01-19
  Administered 2024-01-18 – 2024-01-19 (×4): via INTRAVENOUS

## 2024-01-18 MED ORDER — SODIUM CHLORIDE 0.9 % IV SOLN
0.9 | INTRAVENOUS | Status: DC | PRN
Start: 2024-01-18 — End: 2024-01-23

## 2024-01-18 MED ORDER — ACETAMINOPHEN 325 MG PO TABS
325 | Freq: Four times a day (QID) | ORAL | Status: DC | PRN
Start: 2024-01-18 — End: 2024-01-23
  Administered 2024-01-18: 04:00:00 650 mg via ORAL

## 2024-01-18 MED ORDER — ENOXAPARIN SODIUM 80 MG/0.8ML IJ SOSY
80 | Freq: Two times a day (BID) | INTRAMUSCULAR | Status: DC
Start: 2024-01-18 — End: 2024-01-20
  Administered 2024-01-18 – 2024-01-19 (×4): 80 mg/kg via SUBCUTANEOUS

## 2024-01-18 MED ORDER — METOPROLOL TARTRATE 25 MG PO TABS
25 | Freq: Four times a day (QID) | ORAL | Status: DC
Start: 2024-01-18 — End: 2024-01-19
  Administered 2024-01-18 – 2024-01-19 (×3): 25 mg via ORAL

## 2024-01-18 MED ORDER — POTASSIUM CHLORIDE ER 10 MEQ PO TBCR
10 | ORAL | Status: DC | PRN
Start: 2024-01-18 — End: 2024-01-19

## 2024-01-18 MED ORDER — MAGNESIUM SULFATE 2000 MG/50 ML IVPB PREMIX
2 | INTRAVENOUS | Status: DC | PRN
Start: 2024-01-18 — End: 2024-01-19

## 2024-01-18 MED FILL — METOPROLOL TARTRATE 5 MG/5ML IV SOLN: 5 mg/mL | INTRAVENOUS | Qty: 5 | Fill #0

## 2024-01-18 MED FILL — CITALOPRAM HYDROBROMIDE 20 MG PO TABS: 20 mg | ORAL | Qty: 1 | Fill #0

## 2024-01-18 MED FILL — METOPROLOL TARTRATE 25 MG PO TABS: 25 mg | ORAL | Qty: 1 | Fill #0

## 2024-01-18 MED FILL — ACETAMINOPHEN 325 MG PO TABS: 325 mg | ORAL | Qty: 2 | Fill #0

## 2024-01-18 MED FILL — BD POSIFLUSH 0.9 % IV SOLN: 0.9 % | INTRAVENOUS | Qty: 40 | Fill #0

## 2024-01-18 MED FILL — ENOXAPARIN SODIUM 80 MG/0.8ML IJ SOSY: 80 MG/0.8ML | INTRAMUSCULAR | Qty: 0.8 | Fill #0

## 2024-01-18 MED FILL — TRAZODONE HCL 50 MG PO TABS: 50 mg | ORAL | Qty: 1 | Fill #0

## 2024-01-18 MED FILL — LACTATED RINGERS IV SOLN: INTRAVENOUS | Qty: 1000 | Fill #0

## 2024-01-18 MED FILL — VITAMIN B-1 100 MG PO TABS: 100 mg | ORAL | Qty: 1 | Fill #0

## 2024-01-18 MED FILL — DIVALPROEX SODIUM 125 MG PO TBEC: 125 mg | ORAL | Qty: 4 | Fill #0

## 2024-01-18 NOTE — Consults (Signed)
 "     ELECTROPHYSIOLOGY CARDIOLOGY    APP Addendum    I have seen, interviewed, and examined the patient.  I agree with the history, exam, and was involved in the formulation of the assessment and plan as detailed in the Cardiology/Cardiac Electrophysiology consultation documentation composed today by the Advance Practice Provider. Please see the APPs note for full details, below includes any additional revisions. I have performed the exam and medical decision making portions in its entirety.     Patient with new diagnosis of atrial fibrillation.  Patient's rates are relatively controlled between 80 and 110 bpm at this time.  Patient has a history of medication noncompliance issues and his current presentation raises concern for suicide attempt in the setting of heavy alcohol consumption, urine positive for barbiturates, and epileptic medications.  I would be very hesitant to initiate any antiarrhythmic on this patient at this time given risk of overdose with these medications and current suicidal ideations.  Patient is minimally to nonsymptomatic with his atrial fibrillation at this time and therefore it is very appropriate to pursue rate control strategy initially while he is recovering from his acute presentation.  Will look to follow-up with the patient in the outpatient setting to further discuss rhythm control if he does not spontaneously convert to normal sinus rhythm by the time he follows up.  Patient needs echocardiogram to evaluate systolic function.  At this time given rate control strategy and chads2Vasc score =0, patient does not need anticoagulation.    Data: Labs, ECG, telemetry personally reviewed and interpreted    Thank you so much for the consultation. [facility] will continue to follow along. Please don't hesitate to contact us  with any questions or referrals.  ___________________________    Sincerely, Jenese Mischke N. Clydell, M.D.   [facility]  [facility]   Department of Cardiac Electrophysiology   [facility]  [address] [place], [place] [phone]   [facility]  [address]  [place], [place]  [phone]        [facility]  Electrophysiology Care Note     [x] Initial Encounter     [] Follow-up    Patient Name: Bradley [name] - DOB:1965-05-12 - FMW:[id number]  Primary Cardiologist: None  Consulting Cardiologist: Lang Clydell, MD     Reason for encounter: AF with RVR    HPI:       SALAHUDDIN ARISMENDEZ is a 58 y.o. male with PMH significant for ETOH abuse, bipolar disorder, and seizure disorder.     Yesterday he presented to [facility] ER after fall with associated head injury. He lost consciousness in the setting of increased alcohol use over his baseline. Per chart review he was attempting to end his life. Incidentally, he was found to be in AF with RVR. He denies any prior cardiac history. He does note a history of irregular heart beats. Metoprolol  was started. On tele he remains in AF in 110-130s.     EP was consulted for AF.         Subjective:      Bradley [name] reports slight palpitations     Assessment and Plan     Atrial Fibrillation with RVR  New diagnosis  - Increase metoprolol  to 25 mg QID for improved rate control. Would transition to metoprolol  succinate at discharge.   - Obtain echocardiogram to assess heart function and structure.   -  CHADS-VASc of 0 (pending echo)  - He doesn't appear overly symptomatic from the AF. If we proceed with TEE/DCCV for rhythm control, he will need to take OAC for one month. Given history of ETOH, noncompliance, and suicide attempts, don't think this is an appropriate option.   - Plan for rate control for now. Tirate metoprolol  as needed. Goal HR less than 110.   - Follow up outpatient. If he is still in AF, can discuss cardioversion at that time    ETOH Abuse  - Per primary team     Bipolar Disorder/SI  - Psych  following    Seizure Disorder  - Neuro following          ____________________________________________________________    Cardiac testing  No results found for this or any previous visit.    No results found for this or any previous visit.    No results found for this or any previous visit.      Most recent HS troponins:  Recent Labs     01/18/24  1056   TROPONINT 11.0          ECG 01/18/24: AF, HR 113 bpm, QRS 82 ms, QTC 436 ms    Review of Systems:    [x] All other systems reviewed and all negative except as written in HPI    []  Patient unable to provide secondary to condition    Past Medical History:   Diagnosis Date    Abuse, drug or alcohol (HCC)     alcohol abuse     No past surgical history on file.  Social Hx:  reports that he has been smoking cigarettes. He started smoking about 10 years ago. He has a 10.8 pack-year smoking history. He has been exposed to tobacco smoke. He does not have any smokeless tobacco history on file. He reports current alcohol use of about 40.0 - 70.0 standard drinks of alcohol per week. He reports that he does not currently use drugs.  Family Hx: family history is not on file.  Allergies   Allergen Reactions    Aspirin Nausea And Vomiting          OBJECTIVE:  Wt Readings from Last 3 Encounters:   01/17/24 81.2 kg (179 lb)   12/17/23 77.1 kg (170 lb)   11/01/23 77.1 kg (170 lb)     I/O last 3 completed shifts:  In: 1315.7 [P.O.:170; I.V.:145.7; IV Piggyback:1000]  Out: 1100 [Urine:1100]  I/O this shift:  In: 963.5 [I.V.:963.5]  Out: -     Physical Exam:    Vitals:   Vitals:    01/18/24 0345 01/18/24 0732 01/18/24 0944 01/18/24 1050   BP:  130/72 112/79 118/70   Pulse: 97 82 (!) 158 97   Resp:    16   Temp:  98.2 F (36.8 C)  98.2 F (36.8 C)   TempSrc:  Oral  Oral   SpO2:  94%  96%   Weight:       Height:         Telemetry: AF 110-130s    Gen: Chronically ill appearing, no acute distress  Neck: Supple, No JVD, No Carotid Bruit  Resp: No accessory muscle use, Clear breath sounds, No  rales or rhonchi  Card: Tachy, Irregularly irregular ,variable S1, S2, No murmurs, rubs or gallop. No thrills.   Abd:   Soft, non-tender, non-distended, BS+   MSK: No cyanosis  Skin: No rashes    Neuro: Moving all four extremities, follows commands appropriately  Psych: Good insight, oriented to person, place, alert, Nml Affect  LE: No edema    Data Review:     Radiology:   XR Results (most recent):  Xray Result (most recent):  XR CHEST PORTABLE 01/17/2024    Narrative  EXAM: XR CHEST PORTABLE  ACC#: [id number]    INDICATION: tachycardia    COMPARISON: None.    TECHNIQUE: Frontal view of the chest.    FINDINGS:    Lungs are clear.  The cardiomediastinal configuration is within normal limits.  No acute bony abnormalities.    Impression  No acute cardiopulmonary abnormalities.      Electronically signed by Norval Husky      CT Result (most recent):  CT CERVICAL SPINE WO CONTRAST 01/17/2024    Narrative  EXAM:  CT CERVICAL SPINE WITHOUT CONTRAST    INDICATION: Trauma.    COMPARISON: 02/18/2021    CONTRAST:  None.    TECHNIQUE: Multislice helical CT of the cervical spine was performed without  intravenous contrast administration.  Sagittal and coronal reformats were  generated.  CT dose reduction was achieved through use of a standardized  protocol tailored for this examination and automatic exposure control for dose  modulation.    FINDINGS:    The alignment is within normal limits. There is no fracture or compression  deformity. The odontoid process is intact. The craniocervical junction is within  normal limits. Spondylitic changes are noted, greatest at C5-6 and C6-7.  Posterior osteophyte/disc bulge complexes are largest at these levels resulting  in moderate spinal stenosis. Bilateral neuroforaminal narrowing is greatest at  these levels secondary to uncovertebral osteophyte formation.    Impression  Multilevel spondylosis without acute abnormality.    Electronically signed by DARICE COLON      Lab Results    Component Value Date/Time    NA 139 01/18/2024 01:33 AM    K 3.8 01/18/2024 01:33 AM    CL 101 01/18/2024 01:33 AM    CO2 26 01/18/2024 01:33 AM    BUN 11 01/18/2024 01:33 AM    CREATININE 0.76 01/18/2024 01:33 AM    GLUCOSE 94 01/18/2024 01:33 AM    CALCIUM 8.5 01/18/2024 01:33 AM    LABGLOM >90 01/18/2024 01:33 AM    LABGLOM >60 02/18/2021 02:46 PM      No results found for: BNP  Lab Results   Component Value Date    WBC 9.9 01/18/2024    HGB 13.8 01/18/2024    HCT 40.7 01/18/2024    MCV 95.8 01/18/2024    PLT 264 01/18/2024     No results for input(s): CHOL, HDLC, LDLC, HBA1C in the last 72 hours.    Invalid input(s): TGL    Current meds:    Current Facility-Administered Medications:     enoxaparin  (LOVENOX ) injection 80 mg, 1 mg/kg, SubCUTAneous, Q12H, Helmich, Jamie L, MD, 80 mg at 01/18/24 0209    metoprolol  tartrate (LOPRESSOR ) tablet 25 mg, 25 mg, Oral, 4x Daily, Kierl, Kristina, APRN - NP    sodium chloride  flush 0.9 % injection 5-40 mL, 5-40 mL, IntraVENous, 2 times per day, Orgill, Imani A, MD, 10 mL at 01/18/24 0921    sodium chloride  flush 0.9 % injection 5-40 mL, 5-40 mL, IntraVENous, PRN, Orgill, Imani A, MD    0.9 % sodium chloride  infusion, , IntraVENous, PRN, Orgill, Imani A, MD    thiamine  mononitrate tablet 100 mg, 100 mg, Oral, Daily, Orgill, Imani A, MD, 100 mg at 01/18/24 [phone]  LORazepam  (ATIVAN ) tablet 1 mg, 1 mg, Oral, Q1H PRN **OR** LORazepam  (ATIVAN ) injection 1 mg, 1 mg, IntraVENous, Q1H PRN **OR** LORazepam  (ATIVAN ) tablet 2 mg, 2 mg, Oral, Q1H PRN **OR** LORazepam  (ATIVAN ) injection 2 mg, 2 mg, IntraVENous, Q1H PRN **OR** LORazepam  (ATIVAN ) tablet 3 mg, 3 mg, Oral, Q1H PRN **OR** LORazepam  (ATIVAN ) injection 3 mg, 3 mg, IntraVENous, Q1H PRN **OR** LORazepam  (ATIVAN ) tablet 4 mg, 4 mg, Oral, Q1H PRN **OR** LORazepam  (ATIVAN ) injection 4 mg, 4 mg, IntraVENous, Q1H PRN, Orgill, Imani A, MD    sodium chloride  flush 0.9 % injection 5-40 mL, 5-40 mL, IntraVENous, 2 times per  day, Helmich, Jamie L, MD, 10 mL at 01/18/24 0921    sodium chloride  flush 0.9 % injection 5-40 mL, 5-40 mL, IntraVENous, PRN, Helmich, Jamie L, MD    0.9 % sodium chloride  infusion, , IntraVENous, PRN, Helmich, Jamie L, MD    potassium chloride  (KLOR-CON ) extended release tablet 40 mEq, 40 mEq, Oral, PRN **OR** potassium bicarb-citric acid  (EFFER-K) effervescent tablet 40 mEq, 40 mEq, Oral, PRN **OR** potassium chloride  10 mEq/100 mL IVPB (Peripheral Line), 10 mEq, IntraVENous, PRN, Helmich, Jamie L, MD    magnesium  sulfate 2000 mg in 50 mL IVPB premix, 2,000 mg, IntraVENous, PRN, Helmich, Jamie L, MD    ondansetron  (ZOFRAN -ODT) disintegrating tablet 4 mg, 4 mg, Oral, Q8H PRN **OR** ondansetron  (ZOFRAN ) injection 4 mg, 4 mg, IntraVENous, Q6H PRN, Helmich, Jamie L, MD    polyethylene glycol (GLYCOLAX ) packet 17 g, 17 g, Oral, Daily PRN, Helmich, Jamie L, MD    acetaminophen  (TYLENOL ) tablet 650 mg, 650 mg, Oral, Q6H PRN, 650 mg at 01/17/24 2357 **OR** acetaminophen  (TYLENOL ) suppository 650 mg, 650 mg, Rectal, Q6H PRN, Helmich, Jamie L, MD    lactated ringers  infusion, , IntraVENous, Continuous, Marty Prentice KIDD, MD, Last Rate: 125 mL/hr at 01/18/24 1115, Rate Verify at 01/18/24 1115    citalopram  (CELEXA ) tablet 10 mg, 10 mg, Oral, Daily, Helmich, Jamie L, MD, 10 mg at 01/18/24 0920    traZODone  (DESYREL ) tablet 50 mg, 50 mg, Oral, Nightly PRN, Helmich, Jamie L, MD, 50 mg at 01/17/24 2357    divalproex  (DEPAKOTE ) DR tablet 500 mg, 500 mg, Oral, BID, Helmich, Jamie L, MD, 500 mg at 01/18/24 0920    Evalene Marie, APRN - NP    [facility]  Call center: (P) [phone]  (F) [phone]      CC:No primary care provider on file.       "

## 2024-01-18 NOTE — Plan of Care (Signed)
 "  Problem: Physical Therapy - Adult  Goal: By Discharge: Performs mobility at highest level of function for planned discharge setting.  See evaluation for individualized goals.  Description: FUNCTIONAL STATUS PRIOR TO ADMISSION: Patient was independent without use of DME.    HOME SUPPORT PRIOR TO ADMISSION: Patient reports he is homeless.    Physical Therapy Goals  Initiated 01/18/2024  1.  Patient will move from supine to sit and sit to supine and roll side to side in bed with independence within 7 day(s).    2.  Patient will perform sit to stand with independence within 7 day(s).  3.  Patient will transfer from bed to chair and chair to bed with independence using the least restrictive device within 7 day(s).  4.  Patient will ambulate with independence for 100 feet with the least restrictive device within 7 day(s).   5.  Patient will score low fall risk score on standardized balance test within 7 day(s).   Outcome: Progressing     PHYSICAL THERAPY EVALUATION    Patient: Bradley [name] (58 y.o. male)  Date: 01/18/2024  Primary Diagnosis: Suicidal ideation [R45.851]  Alcohol abuse [F10.10]  Atrial fibrillation with rapid ventricular response (HCC) [I48.91]  Atrial fibrillation with RVR (HCC) [I48.91]  Closed head injury, initial encounter [S09.90XA]       Precautions: Restrictions/Precautions  Restrictions/Precautions: Fall Risk, General Precautions, Suicide  Activity Level: Up with Assist            ASSESSMENT:  Patient is a 58 y.o. male with h/o ETOH abuse, seizures admitted to [facility] on 01/17/24 diagnosed with Afib with RVR, fall with LOC, S.I., ETOH abuse and ? seizure. Patient seen for PT evaluation at this time and is agreeable to participation.     DEFICITS/IMPAIRMENTS:   The patient is limited by decreased functional mobility, strength, sensation, activity tolerance, safety awareness, coordination, balance .    Based on the impairments listed above, the patient is functioning below  baseline level of function. Patient is currently requiring minimal assist to mobilize with gait belt. He reports some lightheadedness with activity and was tachycardic with exertion. RN notified. Currently presents as a fall risk.    Patient will benefit from continued skilled PT interventions to address the above impairments and maximize functional gains.    Functional Outcome Measure:  The patient scored 17/24 on the AMPAC outcome measure which is indicative of continued need for skilled PT.      Vital signs taken during this PT session:   Pulse BP Patient Position   01/18/24 0944 (!) 120-158 112/79 Sitting    RN notified; patient reporting lightheadedness with activity        PLAN :  Recommendations and Planned Interventions:   bed mobility training, transfer training, gait training, therapeutic exercises, neuromuscular re-education, patient and family training/education, and therapeutic activities    Frequency/Duration: Patient will be followed by physical therapy to address goals, PT Plan of Care: 5 times/week to address goals.    Recommend for next PT session: sit to stand transfers, bed to bedside chair transfers, and further progression of gait with existing device    Recommendation for discharge: (in order for the patient to meet his/her long term goals):   Continue to assess pending progress    Other factors to consider for discharge: lives alone, poor safety awareness, impaired cognition, high risk for falls, and concern for safely navigating or managing the home environment    IF patient discharges  home will need the following DME: continuing to assess with progress                SUBJECTIVE:   Patient stated I'm a little lightheaded.    OBJECTIVE DATA SUMMARY:       Past Medical History:   Diagnosis Date    Abuse, drug or alcohol (HCC)     alcohol abuse   No past surgical history on file.    Home Situation:  Social/Functional History  Lives With: Alone  Type of Home: Homeless  Has the patient had two  or more falls in the past year or any fall with injury in the past year?: Yes  Prior Level of Assist for Ambulation: Independent household ambulator, with or without device, Independent community ambulator, with or without device  Prior Level of Assist for Transfers: Independent    Cognitive/Behavioral Status:  Orientation  Orientation Level: Oriented X4  Cognition  Overall Cognitive Status: Exceptions  Arousal/Alertness: Appears intact  Following Commands: Follows one step commands consistently  Attention Span: Appears intact  Safety Judgement: Decreased awareness of need for safety  Insights: Decreased awareness of deficits  Initiation: Does not require cues  Sequencing: Requires cues for some  Cognition Comment: pleasant and cooperative    Hearing:   Hearing  Hearing: Within Functional Limits    Vision/Perceptual:          Vision  Vision:  (denies any acute changes)           Strength:    Strength: Generally decreased, functional  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  Right hip and knee grossly 4/5, right ankle DF 3+/5  Left lower extremity grossly 4+/5    Tone & Sensation:   Tone: Abnormal  Sensation: Impaired (chronic LE neuropathy)    Coordination:  Coordination: Generally decreased, functional  Bilateral lower extremity ataxia    Range Of Motion:  AROM: Generally decreased, functional       Functional Mobility:  Bed Mobility:     Bed Mobility Training  Bed Mobility Training: Yes  Interventions: Safety awareness training  Supine to Sit: Stand by assistance  Scooting: Stand by assistance  Transfers:     Art Therapist: Yes  Interventions: Verbal cues;Safety awareness training  Sit to Stand: Minimal assistance  Stand to Sit: Minimal assistance  Toilet Transfer: Minimal assistance  Trunk sway, wide BOS  Balance:               Balance  Sitting: Impaired  Sitting - Static: Good (unsupported)  Sitting - Dynamic: Fair (occasional)  Standing:  Impaired  Standing - Static: Unsupported;Fair  Standing - Dynamic: Unsupported;Fair;Occasional  Ambulation/Gait Training:                       Gait  Gait Training: Yes  Overall Level of Assistance: Minimal assistance  Distance (ft):  (19ft x 2)  Assistive Device: Gait belt  Interventions: Verbal cues;Safety awareness training  Base of Support: Widened  Speed/Cadence: Pace decreased (< 100 feet/min)  Step Length: Right shortened;Left shortened  Gait Abnormalities: Ataxic;Decreased step clearance;Path deviations   Bilateral lower extremity ataxia, trunk sway and widened base of support  Stability did improve as patient mobilized further  [employer] AM-PAC      Basic Mobility Inpatient Short Form (6-Clicks) Version 2    How much help is needed turning from your back to your side while in a flat bed without using bedrails?: A Little  How much help is needed moving from lying on your back to sitting on the side of a flat bed without using bedrails?: A Little  How much help is needed moving to and from a bed to a chair?: A Little  How much help is needed standing up from a chair using your arms?: A Little  How much help is needed walking in hospital room?: A Little  How much help is needed climbing 3-5 steps with a railing?: A Lot    AM-PAC Inpatient Mobility Raw Score : 17  AM-PAC Inpatient T-Scale Score : 42.13     Cutoff score <=171,2,3 had higher odds of discharging home with home health or need of SNF/IPR.    1. Diane U. Jette, Ronal Broody, Vinoth K. Ranganathan, Sandra D. Passek, Gilmore RAMAN. Waldemar Dale EMERSON Aneta.  Validity of the AM-PAC 6-Clicks Inpatient Daily Activity and Basic Mobility Short Forms. Physical Therapy Mar 2014, 94 (3) 379-391; DOI:  10.2522/ptj.20130199  2. Warren M, Knecht J, Verheijde J, Tompkins J. Association of AM-PAC 6-Clicks Basic Mobility and Daily Activity Scores With Discharge Destination. Phys Ther. 2021 Apr 4;101(4):pzab043. doi: 10.1093/ptj/pzab043. PMID: 66482536.  3. Herbold J, Rajaraman D, Waddell RAMAN, Agayby K, [place] S. Activity Measure for Post-Acute Care 6-Clicks Basic Mobility Scores Predict Discharge Destination After Acute Care Hospitalization in Select Patient Groups: A Retrospective, Observational Study. Arch Rehabil Res Clin Transl. 2022 Jul 16;4(3):100204. doi: 10.1016/j.arrct.7977.899795. PMID: 63876017; PMCID: EFR0517973.  4. Aneta DELENA Darryle RAMAN, Coster W, Ni P. AM-PAC Short Forms Manual 4.0. Revised 04/2018.                                                                                                                                                                                                                               Treatment Rendered:  Yes functional mobility retraining with cues/assist/education on safety and stability for transfers at bed and toilet, ambulation around room/bathroom          Pain Rating:  Mild headache   Pain Intervention(s):   rest and pain is at a level acceptable to the patient    Activity Tolerance:   Fair  and requires rest breaks    After treatment:  Patient left in no apparent distress in bed, Updated patient's board on functional status and mobility recommendations, and PCT present    COMMUNICATION/EDUCATION:   The patient's plan of care was discussed with: occupational therapist, registered nurse, and certified nursing assistant/patient care technician    Patient Education  Education Given To: Patient  Education Provided: Role of Therapy;Plan of Art Therapist;Fall Prevention Strategies;Orientation  Education Method: Verbal  Education Outcome: Verbalized understanding;Demonstrated understanding;Continued education needed    Thank you for this  referral.  Delon Freshwater, PT  Minutes: 19      Physical Therapy Evaluation Charge Determination   History Examination Presentation Decision-Making   HIGH Complexity :3+ comorbidities / personal factors will impact the outcome/ POC  MEDIUM Complexity : 3 Standardized tests and measures addressin body structure, function, activity limitation and / or participation in recreation  MEDIUM Complexity : Evolving with changing characteristics  AM-PAC  MEDIUM   Based on the above components, the patient evaluation is determined to be of the following complexity level: Medium   "

## 2024-01-18 NOTE — Flowsheet Note (Addendum)
"  Patient came in to the unit from [facility] ED, alert and oriented, ambulatory. Still with suicidal ideations, Ciwa score is 3. Patient continue to be on 1:1 constant observer.   01/17/24 2341   Vital Signs   Temp 98.2 F (36.8 C)   Temp Source Oral   Pulse (!) 101   Heart Rate Source Monitor   Respirations 16   BP 111/64   MAP (Calculated) 80   MAP (mmHg) 79   BP Location Left upper arm   Patient Position Supine   Oxygen Therapy   SpO2 95 %   O2 Device None (Room air)         0730  Bedside shift change report given to Angelica, RN (oncoming nurse) by Rosaline, RN, Merritt, RN (offgoing nurse). Report included the following information Nurse Handoff Report.  Still on 1:1, Patient sleeping.  "

## 2024-01-18 NOTE — Consults (Signed)
 "INPATIENT NEUROLOGY CONSULT NOTE      NAME:     Bradley [name]    ADMIT DATE:    01/17/2024  6:42 PM    CONSULT DATE:  01/18/24      HPI: 58 y.o. male with PMHx alcohol use disorder, seizure disorder, crack cocaine use,     Neurology is asked to evaluate to patient for: syncope and collapse, poss seizure?  Poor compliance with AED     Pertinent home meds include: Depakote  DR 500 mg one tablet twice a day    No prior Neurology notes in [facility] EMR    Patient presented to ER after head injury and loss of consciousness, admitted with A Fib with RVR, syncope and collapse, acute alcohol intoxication, and possible breakthrough seizure.  Seizure relayed to admitting physician that he drank more than normal, attempting to end his life.   CT head w/o contrast did not show any acute process or change compared to prior exam (12-17-2023).  CT Cervical Spine shows multi-level spondylosis without acute abnormality.  EEG done today: moderate generalized slowing suggestive of an encephalopathic process, not specific as to cause.  No seizure discharges were seen.     Patient confirms that he was drinking excessively in an attempt to end his life. Regarding the head injury, he says he was sitting then stood up, legs either gave out from under or he lost balance, fell backwards, hit head, does not think he lost awareness. Doesn't think he had a seizure.  Says he does take Depakote  for seizure disorder but that he wasn't taking it regularly until recently. Regarding prior seizures, he says they were in the setting of alcohol withdrawal.       I reviewed the most recent LAB DATA:    CBC with WBC 9.9, Hgb 13.8, HCT 40.7, platelet count 264,000  BMP with NA 139, K3.8, CR 0.76, GFR greater than 90  COVID-19 and influenza combo: None detected  UA negative for UTI  UDS positive for barbiturates  Alcohol level 315    =====================================    Vitals:    01/18/24 1050 01/18/24 1500 01/18/24 1523 01/18/24 1908   BP: 118/70   123/68 112/64   Pulse: 97 (!) 110 (!) 104 81   Resp: 16  18 18    Temp: 98.2 F (36.8 C)  98.4 F (36.9 C) 98.6 F (37 C)   TempSrc: Oral  Oral Oral   SpO2: 96%  96% 96%   Weight:       Height:           NEUROLOGIC EXAM:   Awake, resting, calm, alert. Oriented to person, place, situation.  EOMI, VF normal on both sides, face symmetric (lower face covered by large beard), intact LT both side face, no aphasia, no dysarthria, fluent speech, normal hearing, tongue midline    No resting or postural tremors  Normal FNF bilateral  Intact LT in all exts  5/5 strength in both arms and left leg  Has lower leg atrophy that he reports is due to club foot as a child  Right knee flexion is 5/5    DTRs, Gait deferred       =====================================    ASSESSMENT / PLAN:     Severe depression, suicidal ideation, hx alcohol use disorder, substance use disorder (cocaine), hx of bipolar disorder, hx seizure disorder.  Drank more than normal due to severe depression and suicidal ideation.  Clemens backwards when attempting to stand, hit back of  head, doesn't think he passed out or had a seizure. Admits to medication non-compliance with his Depakote  (which may also be used for his Bipolar disorder).  Reports he started taking it again recently.  EEG today didn't show any seizure discharges, did show moderate generalized slowing suggestive of an encephalopathic process, not specific as to cause. Non-focal neurologic exam. No indication for advanced neuro-imaging/ Brain MRI at this time.      Added Valproic Acid level to AM labs that can be used to adjust his Valproate dose for mood disorder    No additional inpatient neurology workup is planned. Call back if needed.       Portions of this note were completed with Dragon, the computer voice recognition software.  Quite often unanticipated grammatical, syntax, homophones, and other interpretive errors are inadvertently transcribed by the computer software.  Please disregard  these errors.  Efforts were made to edit the dictations but occasionally words are mis-transcribed.     =====================================  No past surgical history on file.    FHx: family history is not on file.    SocHx:  reports that he has been smoking cigarettes. He started smoking about 10 years ago. He has a 10.8 pack-year smoking history. He has been exposed to tobacco smoke. He does not have any smokeless tobacco history on file. He reports current alcohol use of about 40.0 - 70.0 standard drinks of alcohol per week. He reports that he does not currently use drugs.    Allergies   Allergen Reactions    Aspirin Nausea And Vomiting     MEDICATIONS    Current Facility-Administered Medications:     enoxaparin  (LOVENOX ) injection 80 mg, 1 mg/kg, SubCUTAneous, Q12H, Helmich, Jamie L, MD, 80 mg at 01/18/24 1704    metoprolol  tartrate (LOPRESSOR ) tablet 25 mg, 25 mg, Oral, 4x Daily, Kierl, Kristina, APRN - NP, 25 mg at 01/18/24 1704    QUEtiapine  (SEROQUEL ) tablet 50 mg, 50 mg, Oral, Nightly, Marty Prentice KIDD, MD    sodium chloride  flush 0.9 % injection 5-40 mL, 5-40 mL, IntraVENous, 2 times per day, Shearon Maxon A, MD, 10 mL at 01/18/24 0921    sodium chloride  flush 0.9 % injection 5-40 mL, 5-40 mL, IntraVENous, PRN, Orgill, Imani A, MD    0.9 % sodium chloride  infusion, , IntraVENous, PRN, Orgill, Imani A, MD    thiamine  mononitrate tablet 100 mg, 100 mg, Oral, Daily, Orgill, Imani A, MD, 100 mg at 01/18/24 0920    LORazepam  (ATIVAN ) tablet 1 mg, 1 mg, Oral, Q1H PRN **OR** LORazepam  (ATIVAN ) injection 1 mg, 1 mg, IntraVENous, Q1H PRN **OR** LORazepam  (ATIVAN ) tablet 2 mg, 2 mg, Oral, Q1H PRN **OR** LORazepam  (ATIVAN ) injection 2 mg, 2 mg, IntraVENous, Q1H PRN **OR** LORazepam  (ATIVAN ) tablet 3 mg, 3 mg, Oral, Q1H PRN **OR** LORazepam  (ATIVAN ) injection 3 mg, 3 mg, IntraVENous, Q1H PRN **OR** LORazepam  (ATIVAN ) tablet 4 mg, 4 mg, Oral, Q1H PRN **OR** LORazepam  (ATIVAN ) injection 4 mg, 4 mg, IntraVENous, Q1H PRN,  Orgill, Imani A, MD    sodium chloride  flush 0.9 % injection 5-40 mL, 5-40 mL, IntraVENous, 2 times per day, Helmich, Jamie L, MD, 10 mL at 01/18/24 0921    sodium chloride  flush 0.9 % injection 5-40 mL, 5-40 mL, IntraVENous, PRN, Helmich, Jamie L, MD    0.9 % sodium chloride  infusion, , IntraVENous, PRN, Helmich, Jamie L, MD    potassium chloride  (KLOR-CON ) extended release tablet 40 mEq, 40 mEq, Oral, PRN **OR** potassium bicarb-citric acid  (EFFER-K)  effervescent tablet 40 mEq, 40 mEq, Oral, PRN **OR** potassium chloride  10 mEq/100 mL IVPB (Peripheral Line), 10 mEq, IntraVENous, PRN, Helmich, Jamie L, MD    magnesium  sulfate 2000 mg in 50 mL IVPB premix, 2,000 mg, IntraVENous, PRN, Helmich, Jamie L, MD    ondansetron  (ZOFRAN -ODT) disintegrating tablet 4 mg, 4 mg, Oral, Q8H PRN **OR** ondansetron  (ZOFRAN ) injection 4 mg, 4 mg, IntraVENous, Q6H PRN, Helmich, Jamie L, MD    polyethylene glycol (GLYCOLAX ) packet 17 g, 17 g, Oral, Daily PRN, Helmich, Jamie L, MD    acetaminophen  (TYLENOL ) tablet 650 mg, 650 mg, Oral, Q6H PRN, 650 mg at 01/17/24 2357 **OR** acetaminophen  (TYLENOL ) suppository 650 mg, 650 mg, Rectal, Q6H PRN, Helmich, Warren CROME, MD    lactated ringers  infusion, , IntraVENous, Continuous, Marty Prentice KIDD, MD, Last Rate: 125 mL/hr at 01/18/24 1915, New Bag at 01/18/24 1915    traZODone  (DESYREL ) tablet 50 mg, 50 mg, Oral, Nightly PRN, Helmich, Jamie L, MD, 50 mg at 01/17/24 2357    divalproex  (DEPAKOTE ) DR tablet 500 mg, 500 mg, Oral, BID, Helmich, Jamie L, MD, 500 mg at 01/18/24 0920    =====================================    MEDICAL DECISION MAKING (2 of 3: A +/- B +/- C)    A) Number and Complexity of Problem(s) Addressed:  []  1 stable, acute illness (Low)  []  1 stable, chronic illness (Low)  []  1 acute illness with systemic symptoms (Moderate)  []  1 undiagnosed new problem with uncertain prognosis (Moderate)  []  1 or more chronic illness with exacerbation, progression, or side effects of treatment  (Moderate)  []  1 or more chronic illnesses with severe exacerbation, progression, or side effects of treatment (High)  [x]  1 acute or chronic illness or injury that poses a threat to life or bodily function (High)    B) Amount/ Complexity of Data reviewed (1 of 3 categories = Moderate, 2 of 3 categories = High)    Test, documents, or independent historian(s) (any THREE ELEMENTS):   []   Review of external note(s) from unique source:  [x]   Review of the result(s) of each unique test  (3+)   [x]   Ordered a unique test Valproic Acid level  []   Discussed with a historian other than the patient:     Independent interpretation of a test performed by another Physician / QHP:     []  Yes      Discussion of management or test interpretation with another Physician / QHP:     [x]  Yes  Discussed with NP Kashurba/ Hospitalist  via PerfectServe (secure messaging)       C) Risk of Complication, Morbidity, or Mortality:     Escalation of hospital level of care (High Risk):  []  Yes []  No    Decision to de-escalate care or DNR due to poor prognosis (High Risk):     []  Yes   []  No    Parenteral controlled substances prescribed (High Risk):     []  Yes []  No    Drug Therapy requiring intensive monitoring for toxicity (High Risk)  []  Yes   []  No    Prescription drug management (Moderate Risk):      []  Yes     []  No    Decision regarding minor surgery (Moderate Risk):   []  Yes     []  No          "

## 2024-01-18 NOTE — Care Coordination (Addendum)
 "        Care Management Initial Assessment  01/18/2024 3:43 PM  If patient is discharged prior to next notation, then this note serves as note for discharge by case management.    Reason for Admission:   Suicidal ideation [R45.851]  Alcohol abuse [F10.10]  Atrial fibrillation with rapid ventricular response (HCC) [I48.91]  Atrial fibrillation with RVR (HCC) [I48.91]  Closed head injury, initial encounter [S09.90XA]         Patient Admission Status: Inpatient  Date Admitted to INP: 01/17/2024  RUR: Readmission Risk Score: 14    Hospitalization in the last 30 days (Readmission):  Yes        Advance Care Planning:  Code Status: Full Code  Primary Healthcare Decision Maker: (P)  Elray Montclair (Family friend/sister's bestfriend) [phone])   Advance Directive: has NO advanced directive - not interested in additional information     __________________________________________________________________________  Assessment:     Pt was admitted on 01/17/2024 for SI. CM met with Pt to complete initial assessment. CM introduced self, CM role, and verified demographics.     Pt has no hx of HH, home O2, or equipment. Pt is independent with ADLs and ambulation. Denies problems with either.          01/18/24 1539   Service Assessment   Information Provided By Patient   Patient Orientation Alert;Oriented;Person;Place;Situation   Cognition No Apparent Deficit   Primary Caregiver Self   Support Systems Spouse/Significant Other  (Girlfriend Dagoberto SQUIBB. (unable to recall her last name))   Patient's Healthcare Decision Maker is:   Elray Montclair (Family friend/sister's bestfriend) [phone])   PCP Verified by CM No PCP   Prior Functional Level Independent in ADLs/IADLs   History of falls? 0   Current Functional Level at Time of Initial Assessment Independent in ADLs/IADLs   Can patient return to prior living arrangement Other (see comment)  (homeless)   Ability to make needs known: Good   Family able to assist with home care  needs: No   Other than yourself, who should be included in your transition plan for discharge from the hospital? none   Receives Help From Other (Comment)  (girlfriend, Dagoberto SQUIBB.)   Current Community Resources None   Social/Functional History   Prior Level of Assist for ADLs Independent   Prior Level of Assist for [employer] Independent   Prior Level of Assist for Transfers Independent   Artist No   Patient's Driver Info does not have a car   Occupation Chartered Certified Accountant Prior to Acute Admission Homeless   Current Services Prior To Admission None   Potential Assistance Needed Transportation;Transitional Care   Potential Assistance Obtaining Medications No   Patient expects to be discharged to: Other (comment)  (TBD.  Anticipate Inpatient Psych)   Services At/After Discharge   Who will provide transportation at discharge?   Tax Inspector for Inpatient Psych)         Comments:     Discharge Concerns: [x] Yes [] No [] Unknown   Describe:    Financial concerns/barriers: [x] Yes, explain: [] No [] Unknown/Not discussed  __________________________________________________________________________    Insurer:   Active Insurance as of 01/17/2024       Primary Coverage       Payor Plan Insurance Group Employer/Plan Group    [place] [employer]        Payor Plan Address Payor Plan Phone Number Payor  Plan Fax Number Effective Dates    [address] [phone]  08/21/2022 - None Entered    [place] [id number]         Subscriber Name Subscriber Birth Date Member ID       KARTIK, FERNANDO Apr 27, 1965 [id number]                     PCP: [id number]    Pharmacy:   [facility] [place], [place] - [address] - P [phone] [address] [phone]  [address] [place]  [place] [address]  Phone: [phone] Fax: [phone]      DC Transport: (P)  Tax Inspector for Inpatient Psych)       Transition of care plan:    Anticipate  discharging to Inpatient Psych per Psych recommendation.  Patient is Voluntary and does not have a preference of Inpatient Psych Facility.      --Hx at [facility] Inpatient Psych per patient in April or May of 2025.      Living Situation:  --Patient is homeless.  He stated that he has lived in [place] [facility]) most of his life.  He lived in [place] for 8 years in the past.  He stated that he sometimes sleeps in his girlfriend's car (outside his girlfriend's house) or sometimes rents a chief operating officer.  He said that his girlfriend, Dagoberto SQUIBB. Lives in a trailer, but he cannot live with her due to trailer rules.  He said that he has never stayed in a shelter home.      --Patient stated that his wife passed 2 years ago.  --He has 2 living adult sons (both live in [place]).  He has not spoken to them in a long time and does not want to provide their names and contact information.      ---For Emergency Contact:  He wants us  to contact Darice Montclair [phone], his sister's best friend for any emergency.      --INCOME:  Patient stated that he is currently not receiving any income.      --CM contacted [employer] at [phone], to speak to patient's Therapist, Occupational for assistance.  CM was informed by a receptionist that it may take up to 20 days for someone to reach back.        --CM contacted [employer] and confirmed patient's Scientist, Research (medical) (CM called and left a message.  Pending call back):  Tillman Harding with [facility] Social Services  [phone]        Readmission Assessment  Who is being Interviewed: Patient  Number of Days since last admission?: 8-30 days  Previous Disposition: Other (comment) (homeless)  What was the patient's/caregiver's perception as to why they think they needed to return back to the hospital?: Worsening of symptoms/Unexpected complications (Suicidal Ideation)  Did you visit your Primary Care Physician after you left the hospital, before you returned  this time?: No  Why weren't you able to visit your PCP?: Other (Comment) (does not have one)  Did you see a specialist, such as Cardiac, Pulmonary, Orthopedic Physician, etc. after you left the hospital?: No  Who advised the patient to return to the hospital?: Self-referral  Does the patient report anything that got in the way of taking their medications?: No  What could we have done to help prevent your return to the hospital?: Teaching during hospitalization regarding your illness  Have You Received a Follow Up Phone Call from  a Child Psychotherapist After Discharge?: No      ______________________________________  Creighton Mulling, BSN, RN-CM  Shelvy Gwenn Thresa Bernardino- Care Management  Available via PerfectServe  01/18/2024  3:58 PM                  "

## 2024-01-18 NOTE — Plan of Care (Signed)
 "  Problem: Occupational Therapy - Adult  Goal: By Discharge: Performs self-care activities at highest level of function for planned discharge setting.  See evaluation for individualized goals.  Description: FUNCTIONAL STATUS PRIOR TO ADMISSION:  Patient is normally independent with ADLs and mobility without use of AD/DME. He does not drive.     HOME SUPPORT: Pt reports he is homeless with no local/living family to assist him.    Occupational Therapy Goals:  Initiated 01/18/2024  1.  Patient will perform grooming, standing at sink for >2 minutes,  with Modified Independence within 7 day(s).  2.  Patient will perform lower body dressing with Modified Independence within 7 day(s).  3.  Patient will perform bathing with Supervision within 7 day(s).  4.  Patient will perform toilet transfers with Modified Independence  within 7 day(s).  5.  Patient will perform all aspects of toileting with Modified Independence within 7 day(s).  6.  Patient will participate in upper extremity therapeutic exercise/activities with Independence for 10 minutes within 7 day(s).   Outcome: Progressing     OCCUPATIONAL THERAPY EVALUATION    Patient: Bradley [name] (58 y.o. male)  Date: 01/18/2024  Primary Diagnosis: Suicidal ideation [R45.851]  Alcohol abuse [F10.10]  Atrial fibrillation with rapid ventricular response (HCC) [I48.91]  Atrial fibrillation with RVR (HCC) [I48.91]  Closed head injury, initial encounter [S09.90XA]         Precautions: Fall Risk, General Precautions, Suicide                  ASSESSMENT :  The patient is limited by decreased functional mobility, independence in ADLs, strength, sensation, activity tolerance, safety awareness, coordination, balance on day one of hospital admission for fall with head strike, Afib with RVR, and suicidal ideation. Pmhx significant for ETOH and substance abuse, seizures. He is cleared for activity and agreeable to evaluation.    Based on the impairments listed above patient presents  just below his baseline. He is alert and oriented and appropriately cooperative and offers good efforts, though fairly flat affect for duration of session. He has mild tremors bilaterally in hands (R worse than L) and he is generally weak but still able to functionally use UEs to perform ADL tasks. He requires up to min A for toilet transfer this session and benefits from use of grab bars for stability during standing portions of hygiene and clothing mgmt. Some lightheadedness reported and he has notable tachycardia (130s-150s with activity) throughout session. Will follow in acute setting to maximize return to highest level of function and safety.    Functional Outcome Measure:  The patient scored 19/24 on the AM-PAC outcome measure which is indicative of increased likelihood of need for follow up skilled OT services at discharge.         PLAN :  Recommendations and Planned Interventions:   self care training, therapeutic activities, functional mobility training, balance training, therapeutic exercise, visual/perceptual training, endurance activities, cognitive retraining, patient education, home safety training, and family training/education    Frequency/Duration: OT Plan of Care: 3 times/week    Recommendations for mobility with staff: Recommend that staff completes patient mobility with assist x1 using gait belt.    Recommendations for toileting with staff:  recommended toilet device: the bathroom.     Recommendation for discharge: (in order for the patient to meet his/her long term goals):   Continue to assess pending progress    Other factors to consider for discharge: homeless; fall risk; has potential inpt psych  needs    IF patient discharges home will need the following DME: continuing to assess with progress       SUBJECTIVE:   Patient stated, I feel it a little bit. -regarding tachycardia  OBJECTIVE DATA SUMMARY:     Past Medical History:   Diagnosis Date    Abuse, drug or alcohol (HCC)     alcohol  abuse   No past surgical history on file.       Expanded or extensive additional review of patient history:   Social/Functional History  Lives With: Alone  Type of Home: Homeless  Has the patient had two or more falls in the past year or any fall with injury in the past year?: Yes  Prior Level of Assist for Ambulation: Independent household ambulator, with or without device, Independent community ambulator, with or without device  Prior Level of Assist for Transfers: Independent      Hand Dominance: left     EXAMINATION OF PERFORMANCE DEFICITS:    Cognitive/Behavioral Status:  Orientation  Orientation Level: Oriented X4  Cognition  Overall Cognitive Status: Exceptions  Arousal/Alertness: Appears intact  Following Commands: Follows one step commands consistently  Attention Span: Appears intact  Safety Judgement: Decreased awareness of need for safety  Insights: Decreased awareness of deficits  Initiation: Does not require cues  Sequencing: Requires cues for some  Cognition Comment: pleasant and cooperative    Vision/Perceptual:    Vision - Basic Assessment  Visual Field Cut: No  Oculo Motor Control: WNL     Vision  Vision:  (denies any acute changes)    Range of Motion:   AROM: Within functional limits  PROM: Within functional limits    Strength:  Strength: Generally decreased, functional    Coordination:  Coordination: Generally decreased, functional (tremors present in hands; R worse than L)     Coordination: Generally decreased, functional      Tone & Sensation:   Tone: Normal  Sensation:  (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)    Functional Mobility and Transfers for ADLs:    Bed Mobility:     Bed Mobility Training  Bed Mobility Training: Yes  Interventions: Safety awareness training  Supine to Sit: Stand by assistance  Scooting: Stand by assistance    Transfers:      Transfer Training  Transfer Training: Yes  Interventions: Verbal cues;Safety awareness training  Sit to Stand: Minimal assistance  Stand to Sit: Minimal  assistance  Toilet Transfer: Minimal assistance     Balance:      Balance  Sitting: Impaired  Sitting - Static: Good (unsupported)  Sitting - Dynamic: Fair (occasional)  Standing: Impaired  Standing - Static: Unsupported;Fair  Standing - Dynamic: Unsupported;Fair;Occasional    ADL Assessment:   Feeding: Setup     Grooming: Contact guard assistance  Grooming Skilled Clinical Factors: for standing tasks at sink    UE Dressing: Setup     LE Dressing: Contact guard assistance  LE Dressing Skilled Clinical Factors: able to cross legs and manage socks at EOB with SBA    Toileting: Contact guard assistance  Toileting Skilled Clinical Factors: seated bowel hygiene with SBA; standing clothing mgmt with CGA    [employer] AM-PACTM 6 Clicks  Daily Activity Inpatient Short Form  How much help from another person does the patient currently need... Total; A Lot A Little None   1.  Putting on and taking off regular lower body clothing? []   1 []   2 [x]   3 []   4   2.  Bathing (including washing, rinsing, drying)? []   1 []   2 [x]   3 []   4   3.  Toileting, which includes using toilet, bedpan or urinal? []  1 []   2 [x]   3 []   4   4.  Putting on and taking off regular upper body clothing? []   1 []   2 [x]   3 []   4   5.  Taking care of personal grooming such as brushing teeth? []   1 []   2 [x]   3 []   4   6.  Eating meals? []   1 []   2 []   3 [x]   4    2007, Trustees of [address], under license to [place], [place]. All rights reserved     Score: 19/24     Interpretation of Tool:  Represents clinically-significant functional categories (i.e. Activities of daily living).    Cutoff score 39.4 (19) correlates to a good likelihood of discharging home versus a facility  Diane U. Aneta, Ronal Broody, Vinoth K. Ranganathan, Sandra D. Passek, Gilmore RAMAN. Waldemar Dale EMERSON Aneta, AM-PAC 6-Clicks Functional Assessment Scores Predict Acute Care Hospital Discharge Destination, Physical  Therapy, Volume 94, Issue 9, 20 November 2012, Pages [phone], [email or web address]    Treatment Rendered:    Yes      Instructed patient to increase time sitting OOB in chair for pulmonary hygiene, skin integrity, and to increase endurance in preparation for ADLs. Instructed patient to sit OOB for all meals. Patient verbalized understanding of same.     Patient instructed on safe pacing of activity, including taking a break between positions to allow BP to accommodate and to check in to ensure not feeling dizzy or lightheaded. Patient instructed to sit back down for recovery if symptoms do not improve.     Patient participated in ADL re-training in bathroom to engage in toileting and standing grooming tasks. He benefits from cues for safety, pacing, and improved transfer techniques, especially with toilet transfer. Instructed to use grab bar for stability during standing portions of hygiene and clothing mgmt for fall prevention.     Pain Rating:  Patient reporting minimal pain   Pain Intervention(s):   rest and repositioning    Activity Tolerance:   Fair     After treatment:   Patient left in no apparent distress in bed, Call bell within reach, Bed/ chair alarm activated, and 1:1 sitter present    COMMUNICATION/EDUCATION:   The patient's plan of care was discussed with: physical therapist and registered nurse    Patient Education  Education Given To: Patient  Education Provided: Role of Therapy;Plan of Microbiologist;Fall Prevention Strategies;ADL Adaptive Strategies  Education Method: Verbal  Barriers to Learning: None  Education Outcome: Verbalized understanding;Continued education needed    Thank you for this referral.  Anniah Glick, OT  Minutes: 22  "

## 2024-01-18 NOTE — Procedures (Signed)
"    Routine EEG      [facility]      [place], [place]        [phone] [id number] (Medical Records)     Routine EEG (16-channel)    Date of Study:   01-18-2024 / [id number] to 0906  Total Duration:   29 minutes  Date of Interpretation: 01/18/24    Indication:    58 y.o. male  Suicidal ideation [R45.851]  Alcohol abuse [F10.10]  Atrial fibrillation with rapid ventricular response (HCC) [I48.91]  Atrial fibrillation with RVR (HCC) [I48.91]  Closed head injury, initial encounter [S09.90XA]    Impression:      Moderate generalized slowing during wakefulness, suggestive of an encephalopathic process, not specific as to cause.  Potential etiologies include (but not limited to): toxic, metabolic, infectious, neuro-degenerative, iatrogenic (ie sedating medication) processes, or a post-ictal state.  No epileptiform discharges were seen during this recording.  Clinical and Neuro-Imaging correlation is necessary   Single lead EKG showed irregular rhythm consistent with patient's hx of Atrial Fibrillation    =================================  Technical: 16-channel, multiple montages, digital EEG, 10-20 international placement system format.   Simultaneous video recording is performed.  The technical quality of the study was adequate.  Review of the digital spike and seizure detection tool (Persyst) was conducted. Single lead EKG was recorded.      PSHx lists Cranial Surgery: No  No past surgical history on file.    Interpretation:  The patient is described as eyes closed and awake at the beginning of the recording.  The background is symmetric and very low in amplitude.  Is very difficult to see the posterior dominant rhythm.  The sensitivity is increased to 5 V to better assess the posterior dominant rhythm.  Even at this increased sensitivity setting the background is still very low amplitude.  There are times a 5 Hz PDR is seen on both sides.  Photic stimulation does not result in a clear driving response  in either side.  Hyperventilation was not performed.  Drowsiness and sleep were not recorded.  There are no focal areas of slowing or epileptiform discharge during this recording.  The single-lead EKG shows irregular rhythm     ================================    Home Meds    Medications Prior to Admission: divalproex  (DEPAKOTE ) 500 MG DR tablet, Take 1 tablet by mouth in the morning and at bedtime  citalopram  (CELEXA ) 10 MG tablet, Take 1 tablet by mouth daily  traZODone  (DESYREL ) 50 MG tablet, Take 1 tablet by mouth nightly as needed for Sleep    Current Scheduled Meds        enoxaparin , 1 mg/kg, SubCUTAneous, Q12H    metoprolol  tartrate, 25 mg, Oral, 4x Daily    QUEtiapine , 50 mg, Oral, Nightly    sodium chloride  flush, 5-40 mL, IntraVENous, 2 times per day    thiamine , 100 mg, Oral, Daily    sodium chloride  flush, 5-40 mL, IntraVENous, 2 times per day    divalproex , 500 mg, Oral, BID    Current Continuous Infusions       sodium chloride     sodium chloride     lactated ringers  Last Rate: 125 mL/hr at 01/18/24 1115      "

## 2024-01-18 NOTE — Plan of Care (Signed)
"    Problem: Self Harm/Suicidality  Goal: Will have no self-injury during hospital stay  Description: INTERVENTIONS:  1.  Ensure constant observer at bedside with Q15M safety checks  2.  Maintain a safe environment  3.  Secure patient belongings  4.  Ensure family/visitors adhere to safety recommendations  5.  Ensure safety tray has been added to patient's diet order  6.  Every shift and PRN: Re-assess suicidal risk via Frequent Screener    01/18/2024 0355 by Darroll Browning, RN  Outcome: Progressing  01/18/2024 0355 by Darroll Browning, RN  Outcome: Progressing     "

## 2024-01-18 NOTE — Progress Notes (Addendum)
"      01/17/2024     6:52 PM   C-SSRS Suicide Screening   1) Within the past month, have you wished you were dead or wished you could go to sleep and not wake up?  Yes   2) Have you actually had any thoughts of killing yourself?  Yes   6) Have you ever done anything, started to do anything, or prepared to do anything to end your life? No     0026: Patient's answers still remain the same since he was questioned initially 01/17/2024 at 1852.  "

## 2024-01-18 NOTE — Consults (Signed)
 "PSYCHIATRY CONSULT NOTE    REASON FOR CONSULT: Suicidal Ideation       HISTORY OF PRESENTING COMPLAINT:  Bradley [name] is a 58 y.o. White (non-Hispanic) male who is currently admitted to [facility].     The patient is an adult male with a history of severe mood swings, major depression, and a confirmed diagnosis of bipolar disorder, currently homeless with ongoing difficulties adhering to medications due to his living situation. He is being evaluated today for SI without a specific plan.     Current outpatient medications (by report) include Depakote  500 mg and Celexa  10 mg for the past couple of years, which he rates as 8/10 effective when he is able to take them. He acknowledges compliance problems tied to his transient lifestyle. He takes Trazodone  50 mg PRN for sleep at times, not consistently.     He reports daily alcohol consumption. In one account he describes ~4-5 bottles of beer per day; he feels dependent on alcohol and reports that he cannot abstain without withdrawal. He reports prior marijuana use and crack cocaine use, last crack use ~1 month prior to admission. No other substances reported.    He reports a prior suicide attempt in his mid-30s by lithium overdose, leading to hospitalization and two weeks in a mental health facility. He states he continues to experience intermittent SI all the time during depressive nadirs. He is open to further psychiatric evaluation and treatment in an appropriate facility.    Treatment history  Depakote  & Celexa  (past couple of years): mood stabilization/MDD; patient-rated benefit 8/10.  Trazodone  PRN: sleep.  Lithium: discontinued after overdose attempt.  Seroquel : taken previously only while hospitalized.    Mental Status Examination (MSE)  Appearance: Adult male, cooperative, appears in moderate distress consistent with depression.  Behavior: Calm, engaged; oriented to place and time; answers questions; coordinated.  Motor Activity: Normal; no abnormal movements  observed.  Speech: Clear, coherent, normal rate/volume.  Mood: Depressed.  Affect: Blunted; congruent with stated mood; cooperative attitude.  Thought Process: Linear and goal-directed.  Thought Content: Preoccupation with suicidal ideation; denies plan; denies hallucinations and denies delusions.  Cognition: Alert; oriented; adequate concentration; some memory lag; fund of knowledge within normal range.  Insight: Limited into illness impact and risks.  Judgment: Affected by mood state and substance use; impaired impulse control noted historically.  Risk: Active SI without plan; no HI; no other dangerousness reported.    Assessment  F31.32 -- Bipolar II Disorder, current episode depressed, moderate.  Evidence: history of bipolar diagnosis with mood swings; current depressive symptoms.    F33.1 -- Major Depressive Disorder, recurrent, moderate.  Evidence: longstanding depressive episodes with current depressed mood and SI.    F10.20 -- Alcohol dependence, uncomplicated.  Evidence: daily alcohol use, dependence, withdrawal on abstinence.    F19.20 -- Other psychoactive substance dependence, unspecified.  Evidence: crack cocaine use, last ~1 month ago; prior marijuana use.    Z59.0 -- Homelessness.  Evidence: current living situation affecting adherence and stability.    M54.148 -- Suicidal ideations.  Evidence: current SI without plan; past suicide attempt by lithium overdose.    Plan   1) Safety / Level of Care  Maintain suicide precautions    Recommend transfer/admission to inpatient psychiatry for acute stabilization and suicide risk management.  Rationale: Active SI (even without plan), prior suicide attempt, comorbid bipolar depression, alcohol dependence, and homelessness increase short-term and chronic risk. A controlled setting is indicated to stabilize mood, ensure medication adherence, initiate SUD  interventions, and complete risk assessment.    2) Pharmacotherapy  Continue Depakote  500 mg     Discontinue  Celexa  (Citalopram ) 10 mg daily  Rationale:  Celexa  carries significant clinical risks in patients with bipolar disorder and active suicidal ideation when not adequately protected by a mood stabilizer or antipsychotic. SSRIs can precipitate manic or mixed episodes, worsen rapid cycling, and may intensify agitation or impulsivity during depressive phases of bipolar illness.  Given the patients diagnosed bipolar disorder, recent depressive episode, and active suicidal ideation, discontinuation of Celexa  is indicated to minimize the risk of antidepressant-induced mood destabilization or suicidal activation. Management should instead focus on optimizing mood stabilizers (e.g., Depakote ) and introducing an antipsychotic (e.g., Seroquel ), which have demonstrated efficacy and safety in bipolar depression without increasing suicidality risk.      Initiate Seroquel  50 mg QHS.  Continue Trazodone  50 mg PRN for sleep if used safely in unit protocol.    3) Alcohol Use Disorder (AUD) / Withdrawal Risk  Assess for alcohol withdrawal (use standardized protocol, e.g., CIWA as per hospital policy).    Rationale: Daily alcohol dependence with withdrawal risk necessitates monitoring and vitamin repletion. Naltrexone  can reduce cravings and improve outpatient outcomes once medically appropriate.    4) Substance Use (Crack Cocaine)  Psychoeducation on stimulant relapse risks and mood destabilization.    Offer SUD counseling during admission; plan referral to dual-diagnosis program at discharge.  Rationale: Recent stimulant use worsens mood instability and suicide risk; integrated SUD and mood treatment is indicated.    5) Medication Adherence / Practical Barriers  Assess barriers to adherence related to homelessness (storage, routine, access).  Coordinate with case management for medication access solutions (pill organizers, pharmacy synchronization, community clinic linkage).  Rationale: The patient reports benefit from meds but poor  adherence due to unstable housing; addressing logistics supports continuity and reduces relapse risk.    6) Housing / Social Determinants  Case management consult to explore shelter placement, medical respite, or transitional housing, and to connect with community mental health resources.  Rationale: Homelessness exacerbates nonadherence and destabilization; addressing housing improves safety and treatment engagement.    7) Follow-Up / Coordination  Coordinate transfer to inpatient psychiatry once medically cleared.  Disposition: Recommend inpatient psychiatric admission for safety and stabilization. Continue suicide precautions pending transfer.      Thank you for your consult. Please feel free to consult us  again as needed.     Lesley Curly. DNP, PMHNP-BC    "

## 2024-01-18 NOTE — Progress Notes (Addendum)
"  Pt HR sustaining 120s, up to 158 in afib, pt has no c/o SOB or CP at this time, Dr. Marty notified and at bedside. Metoprolol  IV, troponin, and EKG ordered.   "

## 2024-01-18 NOTE — Progress Notes (Signed)
"  Spiritual Health Progress Note  [place]      Room # B319/01    Name: Bradley [name]           Age: 58 y.o.    Gender: male          MRN: [id number]  Religion: Non-denominational       Preferred Language: English      Date: 01/18/24  Visit Time: Begin Time: 1050 End Time : 1112  Complexity of Encounter: Moderate      Visit Summary:   Chart review. Rounded on IMCU. Met and conversed with patient. Patient's sitter is at bedside. Patient shared about his homelessness and not having a place to live once upon discharge. Patient on file is non-denomination. When speaking with the patient, he stated that he did did not have much a belief system these days, but did say prayer could be given. Patient is feeling little support, and shared medical issues and historical records. Provided active listening, compassion, ministry of presence and supportive prayers. Please contact spiritual health services for further assistance needed.    Referral/Consult From: Rounding  Encounter Overview/Reason: Loneliness/Social Isolation  Encounter Code:     Crisis (if applicable):    Service Provided For: Patient     Patient was available.    Faith, Belief, Meaning:   Patient is not connected with a faith tradition or spiritual practice  Family/Friends No family/friends present  Rituals (if applicable) Type:  (prayer)    Importance and Influence:  Patient Other: Patient's faith has deteriorated due to life circumstances.  Family/Friends does not have spiritual/personal beliefs that influence decisions regarding the patient's health    Community:  Patient   Expresses Feelings of Isolation  Family/Friends   No family/ friends present.    Assessment and Plan of Care:   Emotions Expressed by Patient:   Assessment: Anxious, Calm, Coping    Interventions by Chaplain:   Intervention: Active listening, Nurtured Hope, Prayer (assurance of)/Blessing, Discussed belief system/religious practices/faith, Discussed illness injury and its impact, Sustaining  Presence/Ministry of presence     Result/ Response by Patient:   Outcome: Acceptance, Comfort, Encouraged, Engaged in conversation, Receptive, Expressed Gratitude, Coping    Patient Plan of Care:   Plan and Referrals  Plan/Referrals: Other (Comment) (contact spiritual health services for further assistance needed.)     Emotions Expressed by Spouse/Family/Friends:   No family/ friends present when chaplain visited.    Chaplain Interventions with Spouse/ Family/Friends include:   No family present    Spouse/Family/Friends Plan of Care:   No family present      Electronically signed by      Selinda Adine Lemming, [facility]  Staff Chaplain   Spiritual Health Services  Paging Service [phone] (PRAY)   "

## 2024-01-18 NOTE — Progress Notes (Signed)
 "        [facility]                                                                                          Hospitalist Progress Note  Prentice MALVA Flemings, MD  Office Phone: [phone]        Date of Service:  01/18/2024  NAME:  Bradley [name]  DOB:  November 15, 1965  MRN:  [id number]     HPI:   10/29  Patient was in A-fib with RVR today.  Reported that he had central/left-sided dull chest pain at the time that it occurred.  No radiation.  Troponin was normal and EKG without ischemic changes.     Assessment & Plan:     Patient with history of alcohol use disorder, seizure disorder, crack cocaine use who presented to the ED with head injury, loss of consciousness, possible breakthrough seizure, admitted with A-fib with RVR, syncope and collapse, acute alcohol intoxication after suicide attempt with alcohol.    New onset A-fib with RVR  Likely triggered by alcohol intoxication i.e. holiday heart  EP consulted  -Echo pending  - No need for anticoagulation  - Continue metoprolol   - Avoiding antiarrhythmic and anticoagulant medications due to risk of overdose given recent suicide attempt    Alcohol intoxication  Alcohol use disorder  -Monitor withdrawal symptoms with CIWA  - Can consider naltrexone  as long as patient has not been receiving opioids and when more stable.    Suicide attempt  Bipolar 2  Major depressive disorder- Psych consulted: Psych provider recommends transfer/admission to inpatient psychiatry for acute stabilization and suicide risk assessment.  This should be done after patient stabilizes medically.  - Continue Depakote   - Continue as needed trazodone  nightly for sleep  - Stop Celexa  per psychiatry recommendations given risk of mania with bipolar  - Start Seroquel     SDOH  -CM consult for housing resources given homelessness    CC time exclusive of procedures: 35 minutes    Code status:   Full Code  DVT Prophylaxis: enox  Anticipated Disposition: inpatient psych when medically  cleared         Review of Systems:   Pertinent symptoms noted in the HPI    Vital Signs:    Last 24hrs VS reviewed since prior progress note. Most recent are:  Vitals:    01/18/24 1523   BP: 123/68   Pulse: (!) 104   Resp: 18   Temp: 98.4 F (36.9 C)   SpO2: 96%       Physical Examination:     I had a face to face encounter with this patient and independently examined them on 01/18/2024 as outlined below:        General : alert and awake, no acute distress  HEENT: moist mucus membranes  Chest: CTAB, normal WOB  CVS: S1 and S2 heard, no m/g/r  Abd: non distended  Ext: WWP, no edema  Neuro/Psych: no focal deficits  Skin: no rash      Labs and imaging:     Recent Labs  01/17/24  1855 01/18/24  0133   WBC 13.9* 9.9   HGB 16.8 13.8   HCT 47.1 40.7   PLT 306 264     Recent Labs     01/17/24  1855 01/18/24  0133   NA 142 139   K 4.4 3.8   CL 99 101   CO2 27 26   BUN 9 11   MG 2.3  --      Recent Labs     01/17/24  1855   ALT 22   GLOB 3.8     No results for input(s): INR, APTT in the last 72 hours.    Invalid input(s): PTP   No results for input(s): TIBC in the last 72 hours.    Invalid input(s): FE, PSAT, FERR   No results found for: RBCF   No results for input(s): PH, PCO2, PO2 in the last 72 hours.  No results for input(s): CPK in the last 72 hours.    Invalid input(s): CPKMB, CKNDX, TROIQ  No results found for: CHOL, CHLST, CHOLV, HDL, HDLC, LDL, LDLC  No results found for: GLUCPOC  @LABUA @      CT CERVICAL SPINE WO CONTRAST   Final Result   Multilevel spondylosis without acute abnormality.      Electronically signed by DARICE COLON      CT HEAD WO CONTRAST   Final Result   No acute process or change compared to the prior exam.            Electronically signed by DARICE COLON      XR CHEST PORTABLE   Final Result   No acute cardiopulmonary abnormalities.         Electronically signed by Norval Husky          No results found for this or any previous visit.        Level  of Service Documentation     In caring for this patient today, I reviewed recent notes, interpreted lab results and imaging tests, had a face-to-face encounter with the patient, performing the medically necessary appropriate exam and history, counseled the patient/family/caregiver, ordered medications, tests and procedures as indicated for diagnostic and therapeutic purposes, consulted and communicated with other healthcare professionals including care coordination and discharge planning, and documented clinical information in the electronic health record.  ______________________________________________________________________                 Prentice MALVA Flemings, MD     "

## 2024-01-18 NOTE — Plan of Care (Signed)
"    Problem: Discharge Planning  Goal: Discharge to home or other facility with appropriate resources  Outcome: Progressing  Flowsheets (Taken 01/18/2024 0001)  Discharge to home or other facility with appropriate resources: Identify barriers to discharge with patient and caregiver     Problem: Seizure Precautions  Goal: Remains free of injury related to seizures activity  Outcome: Progressing     Problem: Risk for Elopement  Goal: Patient will not exit the unit/facility without proper excort  Outcome: Progressing  Flowsheets  Taken 01/18/2024 0001 by Darroll Browning, RN  Nursing Interventions for Elopement Risk: Assist with personal care needs such as toileting, eating, dressing, as needed to reduce the risk of wandering  Taken 01/17/2024 1953 by Mattis-Francis, Marshall Lander, RN  Nursing Interventions for Elopement Risk: Assist with personal care needs such as toileting, eating, dressing, as needed to reduce the risk of wandering     Problem: Safety - Adult  Goal: Free from fall injury  Outcome: Progressing     Problem: Pain  Goal: Verbalizes/displays adequate comfort level or baseline comfort level  Outcome: Progressing     "

## 2024-01-18 NOTE — BSMART Note (Signed)
 "Initial BSMART Liaison Assessment Form     Section I - Integrated Summary    Reason for consult is: suicidal ideation .    LOS:  1     Presenting problem/Summary:  Pt was seen face to face on the medical floor, sitter was present in the room. Pt presented to the ED after a fall with a head injury. Pt reported he has been diagnosed with bipolar disorder. Pt reported multiple psychiatric admissions. Pt reported at least one attempt of SI years ago. Pt attributes his depression to his chronic homelessness. Pt reported he has been without stable housing for about for over 15 years. Pt stated he is unable to follow up with outpatient treatment due to lack of transportation.   Pt reported he drinks alcohol on a daily basis. Pt reported he has also used cocaine last month. Pt reported he did go thru a drug treatment program last year. Pr does experience sweats and shakes when he is not drinking.   Pt has no support system he stated all his family is dead.   Pt is open to receiving outpatient treatment.   Pt denies SI/HI/AH/VH. Pt endorsed depressed mood.     Precipitant Factors are hx dipolar disorder, chronic homelessness .    The information is given by the patient.  Current Psychiatrist and/or Case Manager is none reported .  Previous Hospitalizations/Treatment: [facility] 9/25     Plan: Defer to the most recent psychiatric note for recommendations.Liaison will monitor and support     Lethality Assessment:  The potential for suicide is noted by the following;  previous history of attempts which occured on (date) in the form(s) of overdose , ,ideation .    The potential for homicide is not noted.  The patient has not been a perpetrator of sexual or physical abuse.    There are not pending charges.  The patient is not felt to be at risk for self-harm or harm to others.      Section II - Psychosocial  The patient's overall mood and attitude is depressed .  Feelings of helplessness and hopelessness are not observed.  Generalized  anxiety is not observed.  Panic is not observed. Phobias are not observed.  Obsessive compulsive tendencies are not observed.      Section III - Mental Status Exam  The patient's appearance is unkempt.  The patient's behavior shows no evidence of impairment. The patient is oriented to time, place, person and situation.  The patient's speech shows no evidence of impairment.  The patient's mood is depressed.  The range of affect is flat.  The patient's thought content demonstrates no evidence of impairment.  The thought process shows no evidence of impairment.  The patient's perception shows no evidence of impairment. The patient's memory shows no evidence of impairment.  The patient's appetite shows no evidence of impairment.  The patient's sleep shows no evidence of impairment. The patient's insight shows no evidence of impairment.  The patient's judgement shows no evidence of impairment.      The patient has demonstrated mental capacity to provide informed consent.    Section IV - Substance Abuse  The patient is  using substances.  The patient is using {alcohol for greater than 10 years with last use on prior to admission  The patient has experienced the following withdrawal symptoms: sweats. UDS result: positive barbitutes  BAL: 315    Section V - Medical  Past Medical History:   Diagnosis Date  Abuse, drug or alcohol (HCC)     alcohol abuse       Section VI - Living Arrangements  The patient is Single.  The patient is homeless . The patient has no children.  The patient will need shelter resources  The patient's source of income comes from unknown .    The patient's greatest support comes from did not identify  and this person will not be involved with the treatment. The patient has not been in an event described as horrible or outside the realm of ordinary life experience either currently or in the past. The patient has not been a victim of sexual/physical abuse.    Section VII - Other Areas of Clinical  Concern    The highest grade achieved is 11th grade  with the overall quality of school experience being described as ok. The patient is currently unemployed and speaks English as a primary language.  The patient has no communication impairments affecting communication. The patient's preference for learning can be described as: can read and write adequately.  The patient's hearing is normal.  The patient's vision is normal.    The patient reports coping skills include:     Hart LOISE Hoyles, LCSW  "

## 2024-01-18 NOTE — Progress Notes (Signed)
"  This Routine EEG was performed in real-time by an EEG technologist.  Electrodes placed according to the 10-20 International System.  Standard sensitivity 7uV/mm and high filter 70 Hz and low filter 1 Hz settings were set at initiation of the recording and adjustments, as appropriate, and noted on the recording.  Baseline electrode impedances were at or below 10k Ohms.  Per protocol, this recording data is uploaded/transferred from the EEG equipment to a central storage location in real time.  Duration of this EEG was 28:52 minutes.  Photic stimulation was performed, and Hyperventilation was not performed.  Digital analysis is recorded using a software tool that analyzes EEG data to identify seizures called Persyst.    "

## 2024-01-18 NOTE — Progress Notes (Signed)
"  SI checklist completed.   "

## 2024-01-19 ENCOUNTER — Inpatient Hospital Stay: Admit: 2024-01-19 | Payer: Medicaid (Managed Care)

## 2024-01-19 LAB — BASIC METABOLIC PANEL
Anion Gap: 6 mmol/L (ref 2–14)
BUN/Creatinine Ratio: 15 (ref 12–20)
BUN: 12 mg/dL (ref 6–20)
CO2: 26 mmol/L (ref 20–29)
Calcium: 8.3 mg/dL — ABNORMAL LOW (ref 8.6–10.0)
Chloride: 105 mmol/L (ref 98–107)
Creatinine: 0.79 mg/dL (ref 0.70–1.20)
Est, Glom Filt Rate: >90 ml/min/1.73m2 (ref >59)
Glucose: 91 mg/dL (ref 65–100)
Potassium: 3.7 mmol/L (ref 3.5–5.1)
Sodium: 137 mmol/L (ref 136–145)

## 2024-01-19 LAB — ECHO (TTE) COMPLETE (PRN CONTRAST/BUBBLE/STRAIN/3D)
AV Area by Peak Velocity: 1.6 cm2
AV Area by VTI: 1.8 cm2
AV Mean Gradient: 8 mmHg
AV Mean Velocity: 1.4 m/s
AV Peak Gradient: 15 mmHg
AV Peak Velocity: 1.9 m/s
AV VTI: 43.8 cm
AV Velocity Ratio: 0.47
AVA/BSA Peak Velocity: 0.8 cm2/m2
AVA/BSA VTI: 0.9 cm2/m2
Body Surface Area: 2.03 m2
E/E' Lateral: 23.16
E/E' Ratio (Averaged): 22.6
E/E' Septal: 22.03
EF Physician: 55 %
Est. RA Pressure: 3 mmHg
Fractional Shortening 2D: 26 % (ref 28–44)
IVSd: 0.9 cm (ref 0.6–1.0)
LA Diameter: 3.7 cm
LA Size Index: 1.82 cm/m2
LA Volume A-L A4C: 51 mL (ref 18–58)
LA Volume A-L A4C: 75 mL — AB (ref 18–58)
LA Volume A/L: 64 mL
LA Volume BP: 60 mL — AB (ref 18–58)
LA Volume Index A-L A2C: 25 mL/m2 (ref 16–34)
LA Volume Index A-L A4C: 37 mL/m2 — AB (ref 16–34)
LA Volume Index A/L: 32 mL/m2 (ref 16–34)
LA Volume Index BP: 30 mL/m2 (ref 16–34)
LA Volume Index MOD A2C: 24 mL/m2 (ref 16–34)
LA Volume Index MOD A4C: 34 mL/m2 (ref 16–34)
LA Volume MOD A2C: 49 mL (ref 18–58)
LA Volume MOD A4C: 69 mL — AB (ref 18–58)
LV E' Lateral Velocity: 7.21 cm/s
LV E' Septal Velocity: 7.58 cm/s
LV Ejection Fraction A2C: 57 %
LV Ejection Fraction A4C: 53 %
LV Mass 2D Index: 73 g/m2 (ref 49–115)
LV Mass 2D: 148.1 g (ref 88–224)
LV RWT Ratio: 0.43
LVIDd Index: 2.27 cm/m2
LVIDd: 4.6 cm (ref 4.2–5.9)
LVIDs Index: 1.67 cm/m2
LVIDs: 3.4 cm
LVOT Area: 3.5 cm2
LVOT Diameter: 2.1 cm
LVOT Mean Gradient: 2 mmHg
LVOT Peak Gradient: 3 mmHg
LVOT Peak Velocity: 0.9 m/s
LVOT SV: 78.5 mL
LVOT Stroke Volume Index: 38.7 mL/m2
LVOT VTI: 22.7 cm
LVOT:AV VTI Index: 0.52
LVPWd: 1 cm (ref 0.6–1.0)
MV A Velocity: 1.39 m/s
MV E Velocity: 1.67 m/s
MV E Wave Deceleration Time: 229.6 ms
MV E/A: 1.2
RV Free Wall Peak S': 9.4 cm/s
RVIDd: 4.2 cm
RVSP: 28 mmHg
TAPSE: 2.3 cm (ref 1.7–?)
TR Max Velocity: 2.49 m/s
TR Peak Gradient: 25 mmHg

## 2024-01-19 LAB — EKG 12-LEAD
Atrial Rate: 69 {beats}/min
Diagnosis: NORMAL
P Axis: 33 degrees
P-R Interval: 150 ms
Q-T Interval: 320 ms
Q-T Interval: 318 ms
Q-T Interval: 382 ms
QRS Duration: 86 ms
QRS Duration: 82 ms
QRS Duration: 84 ms
QTc Calculation (Bazett): 438 ms
QTc Calculation (Bazett): 409 ms
QTc Calculation (Bazett): 436 ms
R Axis: 55 degrees
R Axis: 56 degrees
R Axis: 66 degrees
T Axis: 18 degrees
T Axis: 33 degrees
T Axis: 41 degrees
Ventricular Rate: 113 {beats}/min
Ventricular Rate: 113 {beats}/min
Ventricular Rate: 69 {beats}/min

## 2024-01-19 LAB — CBC WITH AUTO DIFFERENTIAL
Basophils %: 0.6 % (ref 0.0–1.0)
Basophils Absolute: 0.04 K/UL (ref 0.00–0.10)
Eosinophils %: 2.3 % (ref 0.0–7.0)
Eosinophils Absolute: 0.15 K/UL (ref 0.00–0.40)
Hematocrit: 37.8 % (ref 36.6–50.3)
Hemoglobin: 12.6 g/dL (ref 12.1–17.0)
Immature Granulocytes %: 0.3 % (ref 0.0–0.5)
Immature Granulocytes Absolute: 0.02 K/UL (ref 0.00–0.04)
Lymphocytes %: 43.4 % (ref 12.0–49.0)
Lymphocytes Absolute: 2.89 K/UL (ref 0.80–3.50)
MCH: 33.2 pg (ref 26.0–34.0)
MCHC: 33.3 g/dL (ref 30.0–36.5)
MCV: 99.5 FL — ABNORMAL HIGH (ref 80.0–99.0)
MPV: 10.1 FL (ref 8.9–12.9)
Monocytes %: 12.3 % (ref 5.0–13.0)
Monocytes Absolute: 0.82 K/UL (ref 0.00–1.00)
Neutrophils %: 41.1 % (ref 32.0–75.0)
Neutrophils Absolute: 2.74 K/UL (ref 1.80–8.00)
Nucleated RBCs: 0 /100{WBCs}
Platelets: 184 K/uL (ref 150–400)
RBC: 3.8 M/uL — ABNORMAL LOW (ref 4.10–5.70)
RDW: 13.8 % (ref 11.5–14.5)
WBC: 6.7 K/uL (ref 4.1–11.1)
nRBC: 0 K/uL (ref 0.00–0.01)

## 2024-01-19 LAB — VALPROIC ACID LEVEL, TOTAL: Valproic Acid: 33 ug/mL — ABNORMAL LOW (ref 50–100)

## 2024-01-19 MED ORDER — METOPROLOL SUCCINATE ER 50 MG PO TB24
50 | Freq: Every day | ORAL | Status: DC
Start: 2024-01-19 — End: 2024-01-23
  Administered 2024-01-19 – 2024-01-23 (×5): 50 mg via ORAL

## 2024-01-19 MED FILL — METOPROLOL TARTRATE 25 MG PO TABS: 25 mg | ORAL | Qty: 1 | Fill #0

## 2024-01-19 MED FILL — METOPROLOL SUCCINATE ER 50 MG PO TB24: 50 mg | ORAL | Qty: 1 | Fill #0

## 2024-01-19 MED FILL — DIVALPROEX SODIUM 125 MG PO TBEC: 125 mg | ORAL | Qty: 4 | Fill #0

## 2024-01-19 MED FILL — VITAMIN B-1 100 MG PO TABS: 100 mg | ORAL | Qty: 1 | Fill #0

## 2024-01-19 MED FILL — QUETIAPINE FUMARATE 25 MG PO TABS: 25 mg | ORAL | Qty: 2 | Fill #0

## 2024-01-19 MED FILL — ENOXAPARIN SODIUM 80 MG/0.8ML IJ SOSY: 80 MG/0.8ML | INTRAMUSCULAR | Qty: 0.8 | Fill #0

## 2024-01-19 NOTE — Care Coordination (Addendum)
"  4:14 PM    Behavioral Access team updated CM- reported that no available units to accommodate pt at this time as he would be considered a 1 to 1 for assistance with ADLs per therapy notes. They will review again tomorrow- CM will continue to follow.    LF.    2:30 PM   01/19/24    Care Management Progress Note    Reason for Admission:   Suicidal ideation [R45.851]  Alcohol abuse [F10.10]  Atrial fibrillation with rapid ventricular response (HCC) [I48.91]  Atrial fibrillation with RVR (HCC) [I48.91]  Closed head injury, initial encounter [S09.90XA]       Patient Admission Status: Inpatient  Date Admitted to INP:  10/28  RUR: Readmission Risk Score: 15.8    Hospitalization in the last 30 days (Readmission):  Yes        Transition of care plan:  Pt discussed during IDR- ongoing medical management; pt has 1:1 sitter for SI.  Anticipated discharge plan: CM met face to face with pt- he is agreeable to inpatient behavioral health unit. He has been to [facility] in the past and he would prefer any other unit- but will go if [facility] is the only available option.   Date IM given: [x] NA  Outpatient follow-up.  Discharge transport: BLS      CM called [facility] access line [phone]) to request bed search to be started- access team will review pt's clinicals and let CM know of bed availability.    CM will follow.    Sonny Gillis, MSW    "

## 2024-01-19 NOTE — Plan of Care (Signed)
"    Problem: Seizure Precautions  Goal: Remains free of injury related to seizures activity  Outcome: Progressing     Problem: Safety - Adult  Goal: Free from fall injury  Outcome: Progressing     Problem: Pain  Goal: Verbalizes/displays adequate comfort level or baseline comfort level  Outcome: Progressing     "

## 2024-01-19 NOTE — Progress Notes (Signed)
 "      [facility]  Electrophysiology Care Note     [] Initial Encounter     [x] Follow-up    Patient Name: Bradley [name] - DOB:07-Oct-1965 - FMW:[id number]  Primary Cardiologist: None  Consulting Cardiologist: Lang Browner, MD     Reason for encounter: AF with RVR    HPI:       Bradley [name] is a 59 y.o. male with PMH significant for ETOH abuse, bipolar disorder, and seizure disorder.     Yesterday he presented to [facility] ER after fall with associated head injury. He lost consciousness in the setting of increased alcohol use over his baseline. Per chart review he was attempting to end his life. Incidentally, he was found to be in AF with RVR. He denies any prior cardiac history. He does note a history of irregular heart beats. Metoprolol  was started. On tele he remains in AF in 110-130s.     EP was consulted for AF.       Subjective:      Bradley [name] reports resolution of palpitations     Assessment and Plan     Atrial Fibrillation with RVR  New diagnosis  - Obtain echocardiogram to assess heart function and structure.   - CHADS-VASc of 0 (pending echo)  - He converted to SR yesterday  - Transition metoprolol  to Toprol  50 mg daily   - Follow up in EP clinic in 1 month. Can discuss ablation at that time.   - EP will sign off. Please reach out with any further questions.     ETOH Abuse  - Per primary team     Bipolar Disorder/SI  - Psych following    Seizure Disorder  - Neuro following          ____________________________________________________________    Cardiac testing  No results found for this or any previous visit.    No results found for this or any previous visit.    No results found for this or any previous visit.      Most recent HS troponins:  Recent Labs     01/18/24  1056   TROPONINT 11.0          ECG 01/18/24: AF, HR 113 bpm, QRS 82 ms, QTC 436 ms    Review of Systems:    [x] All other systems reviewed and all negative except as written in HPI    []  Patient unable to provide  secondary to condition    Past Medical History:   Diagnosis Date    Abuse, drug or alcohol (HCC)     alcohol abuse     No past surgical history on file.  Social Hx:  reports that he has been smoking cigarettes. He started smoking about 10 years ago. He has a 10.8 pack-year smoking history. He has been exposed to tobacco smoke. He does not have any smokeless tobacco history on file. He reports current alcohol use of about 40.0 - 70.0 standard drinks of alcohol per week. He reports that he does not currently use drugs.  Family Hx: family history is not on file.  Allergies   Allergen Reactions    Aspirin Nausea And Vomiting          OBJECTIVE:  Wt Readings from Last 3 Encounters:   01/17/24 81.2 kg (179 lb)   12/17/23 77.1 kg (170 lb)   11/01/23 77.1 kg (170 lb)     I/O last 3 completed shifts:  In:  2279.2 [P.O.:170; I.V.:1109.2; IV Piggyback:1000]  Out: 1100 [Urine:1100]  No intake/output data recorded.    Physical Exam:    Vitals:   Vitals:    01/19/24 0044 01/19/24 0321 01/19/24 0430 01/19/24 0724   BP: 135/68 (!) 147/73 (!) 147/73 (!) 141/75   Pulse:  64  62   Resp:  16  16   Temp:  98.1 F (36.7 C)  98.3 F (36.8 C)   TempSrc:  Oral  Oral   SpO2:  96%  95%   Weight:       Height:         Telemetry: SR 60s    Gen: Chronically ill appearing, no acute distress  Neck: Supple, No JVD, No Carotid Bruit  Resp: No accessory muscle use, Clear breath sounds, No rales or rhonchi  Card: Regular rate and rhythm, normal S1, S2, No murmurs, rubs or gallop. No thrills.   Abd:   Soft, non-tender, non-distended, BS+   MSK: No cyanosis  Skin: No rashes    Neuro: Moving all four extremities, follows commands appropriately  Psych: Good insight, oriented to person, place, alert, Nml Affect  LE: No edema    Data Review:     Radiology:   XR Results (most recent):  Xray Result (most recent):  XR CHEST PORTABLE 01/17/2024    Narrative  EXAM: XR CHEST PORTABLE  ACC#: [id number]    INDICATION: tachycardia    COMPARISON:  None.    TECHNIQUE: Frontal view of the chest.    FINDINGS:    Lungs are clear.  The cardiomediastinal configuration is within normal limits.  No acute bony abnormalities.    Impression  No acute cardiopulmonary abnormalities.      Electronically signed by Norval Husky      CT Result (most recent):  CT CERVICAL SPINE WO CONTRAST 01/17/2024    Narrative  EXAM:  CT CERVICAL SPINE WITHOUT CONTRAST    INDICATION: Trauma.    COMPARISON: 02/18/2021    CONTRAST:  None.    TECHNIQUE: Multislice helical CT of the cervical spine was performed without  intravenous contrast administration.  Sagittal and coronal reformats were  generated.  CT dose reduction was achieved through use of a standardized  protocol tailored for this examination and automatic exposure control for dose  modulation.    FINDINGS:    The alignment is within normal limits. There is no fracture or compression  deformity. The odontoid process is intact. The craniocervical junction is within  normal limits. Spondylitic changes are noted, greatest at C5-6 and C6-7.  Posterior osteophyte/disc bulge complexes are largest at these levels resulting  in moderate spinal stenosis. Bilateral neuroforaminal narrowing is greatest at  these levels secondary to uncovertebral osteophyte formation.    Impression  Multilevel spondylosis without acute abnormality.    Electronically signed by DARICE COLON      Lab Results   Component Value Date/Time    NA 137 01/19/2024 04:06 AM    K 3.7 01/19/2024 04:06 AM    CL 105 01/19/2024 04:06 AM    CO2 26 01/19/2024 04:06 AM    BUN 12 01/19/2024 04:06 AM    CREATININE 0.79 01/19/2024 04:06 AM    GLUCOSE 91 01/19/2024 04:06 AM    CALCIUM 8.3 01/19/2024 04:06 AM    LABGLOM >90 01/19/2024 04:06 AM    LABGLOM >60 02/18/2021 02:46 PM      No results found for: BNP  Lab Results   Component Value Date    WBC  6.7 01/19/2024    HGB 12.6 01/19/2024    HCT 37.8 01/19/2024    MCV 99.5 (H) 01/19/2024    PLT 184 01/19/2024     No results for  input(s): CHOL, HDLC, LDLC, HBA1C in the last 72 hours.    Invalid input(s): TGL    Current meds:    Current Facility-Administered Medications:     metoprolol  succinate (TOPROL  XL) extended release tablet 50 mg, 50 mg, Oral, Daily, Jaleeyah Munce, APRN - NP    enoxaparin  (LOVENOX ) injection 80 mg, 1 mg/kg, SubCUTAneous, Q12H, Helmich, Jamie L, MD, 80 mg at 01/19/24 0336    QUEtiapine  (SEROQUEL ) tablet 50 mg, 50 mg, Oral, Nightly, Marty Prentice KIDD, MD, 50 mg at 01/18/24 2224    sodium chloride  flush 0.9 % injection 5-40 mL, 5-40 mL, IntraVENous, 2 times per day, Orgill, Imani A, MD, 10 mL at 01/19/24 0829    sodium chloride  flush 0.9 % injection 5-40 mL, 5-40 mL, IntraVENous, PRN, Orgill, Imani A, MD    0.9 % sodium chloride  infusion, , IntraVENous, PRN, Orgill, Imani A, MD    thiamine  mononitrate tablet 100 mg, 100 mg, Oral, Daily, Orgill, Imani A, MD, 100 mg at 01/19/24 0831    LORazepam  (ATIVAN ) tablet 1 mg, 1 mg, Oral, Q1H PRN **OR** LORazepam  (ATIVAN ) injection 1 mg, 1 mg, IntraVENous, Q1H PRN **OR** LORazepam  (ATIVAN ) tablet 2 mg, 2 mg, Oral, Q1H PRN **OR** LORazepam  (ATIVAN ) injection 2 mg, 2 mg, IntraVENous, Q1H PRN **OR** LORazepam  (ATIVAN ) tablet 3 mg, 3 mg, Oral, Q1H PRN **OR** LORazepam  (ATIVAN ) injection 3 mg, 3 mg, IntraVENous, Q1H PRN **OR** LORazepam  (ATIVAN ) tablet 4 mg, 4 mg, Oral, Q1H PRN **OR** LORazepam  (ATIVAN ) injection 4 mg, 4 mg, IntraVENous, Q1H PRN, Orgill, Imani A, MD    sodium chloride  flush 0.9 % injection 5-40 mL, 5-40 mL, IntraVENous, 2 times per day, Helmich, Jamie L, MD, 10 mL at 01/19/24 0829    sodium chloride  flush 0.9 % injection 5-40 mL, 5-40 mL, IntraVENous, PRN, Helmich, Jamie L, MD    0.9 % sodium chloride  infusion, , IntraVENous, PRN, Helmich, Jamie L, MD    potassium chloride  (KLOR-CON ) extended release tablet 40 mEq, 40 mEq, Oral, PRN **OR** potassium bicarb-citric acid  (EFFER-K) effervescent tablet 40 mEq, 40 mEq, Oral, PRN **OR** potassium chloride  10 mEq/100  mL IVPB (Peripheral Line), 10 mEq, IntraVENous, PRN, Helmich, Jamie L, MD    magnesium  sulfate 2000 mg in 50 mL IVPB premix, 2,000 mg, IntraVENous, PRN, Helmich, Jamie L, MD    ondansetron  (ZOFRAN -ODT) disintegrating tablet 4 mg, 4 mg, Oral, Q8H PRN **OR** ondansetron  (ZOFRAN ) injection 4 mg, 4 mg, IntraVENous, Q6H PRN, Helmich, Jamie L, MD    polyethylene glycol (GLYCOLAX ) packet 17 g, 17 g, Oral, Daily PRN, Helmich, Jamie L, MD    acetaminophen  (TYLENOL ) tablet 650 mg, 650 mg, Oral, Q6H PRN, 650 mg at 01/17/24 2357 **OR** acetaminophen  (TYLENOL ) suppository 650 mg, 650 mg, Rectal, Q6H PRN, Helmich, Jamie L, MD    traZODone  (DESYREL ) tablet 50 mg, 50 mg, Oral, Nightly PRN, Helmich, Jamie L, MD, 50 mg at 01/17/24 2357    divalproex  (DEPAKOTE ) DR tablet 500 mg, 500 mg, Oral, BID, Helmich, Jamie L, MD, 500 mg at 01/19/24 9171    Evalene Marie, APRN - NP    [facility]  Call center: (P) [phone]  (F) [phone]      CC:No primary care provider on file.       "

## 2024-01-19 NOTE — Progress Notes (Signed)
 "        [facility]                                                                                          Hospitalist Progress Note  Prentice MALVA Flemings, MD  Office Phone: [phone]        Date of Service:  01/19/2024  NAME:  Bradley [name]  DOB:  [birth year]  MRN:  [id number]     HPI:   10/29  Patient was in A-fib with RVR today.  Reported that he had central/left-sided dull chest pain at the time that it occurred.  No radiation.  Troponin was normal and EKG without ischemic changes.    10/30  In NSR. No complaints, ok w inpatient psych.     Assessment & Plan:     Patient with history of alcohol use disorder, seizure disorder, cocaine use who presented to the ED with head injury, loss of consciousness, possible breakthrough seizure, admitted with A-fib with RVR, syncope and collapse, acute alcohol intoxication after suicide attempt with alcohol.    New onset A-fib with RVR  Likely triggered by alcohol intoxication i.e. holiday heart  EP consulted  -Echo w normal EF, diastolic dysfunction.  - No need for anticoagulation  - Continue metoprolol   - Avoiding antiarrhythmic and anticoagulant medications due to risk of overdose given recent suicide attempt    Alcohol intoxication  Alcohol use disorder  -Monitor withdrawal symptoms with CIWA  - Can consider naltrexone  as long as patient has not been receiving opioids and when more stable. Not interested inpatient    Possible seizure  Patient taking Depakote  for alcohol induced seizures, as well as bipolar  - EEG with generalized slowing, no epileptiform discharges  - Neurology consulted.  No clear indication of seizure in history  - valproic level low: neuro following    Suicide attempt  Bipolar 2  Major depressive disorder- Psych consulted: Psych provider recommends transfer/admission to inpatient psychiatry for acute stabilization and suicide risk assessment.  This should be done after patient stabilizes medically.  - Continue Depakote   -  Continue as needed trazodone  nightly for sleep  - Stop Celexa  per psychiatry recommendations given risk of mania with bipolar  - Start Seroquel     SDOH  -CM consult for housing resources given homelessness    Code status:   Full Code  DVT Prophylaxis: enox  Anticipated Disposition: inpatient psych when medically cleared         Review of Systems:   Pertinent symptoms noted in the HPI    Vital Signs:    Last 24hrs VS reviewed since prior progress note. Most recent are:  Vitals:    01/19/24 0724   BP: (!) 141/75   Pulse: 62   Resp: 16   Temp: 98.3 F (36.8 C)   SpO2: 95%       Physical Examination:     I had a face to face encounter with this patient and independently examined them on 01/19/2024 as outlined below:        General : alert and awake, no acute  distress  HEENT: moist mucus membranes  Chest: CTAB, normal WOB  CVS: S1 and S2 heard, no m/g/r  Abd: non distended  Ext: WWP, no edema  Neuro/Psych: no focal deficits  Skin: no rash      Labs and imaging:     Recent Labs     01/18/24  0133 01/19/24  0406   WBC 9.9 6.7   HGB 13.8 12.6   HCT 40.7 37.8   PLT 264 184     Recent Labs     01/17/24  1855 01/18/24  0133 01/19/24  0406   NA 142 139 137   K 4.4 3.8 3.7   CL 99 101 105   CO2 27 26 26    BUN 9 11 12    MG 2.3  --   --      Recent Labs     01/17/24  1855   ALT 22   GLOB 3.8     No results for input(s): INR, APTT in the last 72 hours.    Invalid input(s): PTP   No results for input(s): TIBC in the last 72 hours.    Invalid input(s): FE, PSAT, FERR   No results found for: RBCF   No results for input(s): PH, PCO2, PO2 in the last 72 hours.  No results for input(s): CPK in the last 72 hours.    Invalid input(s): CPKMB, CKNDX, TROIQ  No results found for: CHOL, CHLST, CHOLV, HDL, HDLC, LDL, LDLC  No results found for: GLUCPOC  @LABUA @      CT CERVICAL SPINE WO CONTRAST   Final Result   Multilevel spondylosis without acute abnormality.      Electronically signed by DARICE COLON      CT HEAD WO CONTRAST   Final Result   No acute process or change compared to the prior exam.            Electronically signed by DARICE COLON      XR CHEST PORTABLE   Final Result   No acute cardiopulmonary abnormalities.         Electronically signed by Norval Husky          No results found for this or any previous visit.        Level of Service Documentation     In caring for this patient today, I reviewed recent notes, interpreted lab results and imaging tests, had a face-to-face encounter with the patient, performing the medically necessary appropriate exam and history, counseled the patient/family/caregiver, ordered medications, tests and procedures as indicated for diagnostic and therapeutic purposes, consulted and communicated with other healthcare professionals including care coordination and discharge planning, and documented clinical information in the electronic health record.  ______________________________________________________________________                 Prentice MALVA Flemings, MD     "

## 2024-01-19 NOTE — Progress Notes (Signed)
"    Physician Progress Note      PATIENTDAMARIO, GILLIE  CSN #:                  [id number]  DOB:                       [birth year]  ADMIT DATE:       01/17/2024 6:42 PM  DISCH DATE:  RESPONDING  PROVIDER #:        Prentice MALVA Flemings MD          QUERY TEXT:    Atrial fibrillation is documented in the medical record per H&P. Please   clarify the type:    The clinical indicators include:  # New onset A-fib with RVR  # Secondary hypercoagulable state  POA, possibly triggered by alcohol intoxication.  HR into 130s in the ER,  EKG shows A-fib with RVR.  - CHADS-VASc of 0 (pending echo)  -Started on metoprolol , will continue and titrate as needed  -Cardiology consult  -Treatment dose Lovenox   Options provided:  -- Chronic, unspecified  -- Paroxysmal  -- Permanent  -- Chronic persistent  -- Longstanding persistent  -- Other persistent  -- Other - I will add my own diagnosis  -- Disagree - Not applicable / Not valid  -- Refer to Clinical Documentation Reviewer    PROVIDER RESPONSE TEXT:    The patient's atrial fibrillation is paroxysmal.    Query created by: Ashley Rung on 01/18/2024 3:32 PM      Electronically signed by:  Prentice MALVA Flemings MD 01/19/2024 4:27 PM          "

## 2024-01-20 LAB — BASIC METABOLIC PANEL
Anion Gap: 8 mmol/L (ref 2–14)
BUN/Creatinine Ratio: 12 (ref 12–20)
BUN: 9 mg/dL (ref 6–20)
CO2: 25 mmol/L (ref 20–29)
Calcium: 8.8 mg/dL (ref 8.6–10.0)
Chloride: 106 mmol/L (ref 98–107)
Creatinine: 0.8 mg/dL (ref 0.70–1.20)
Est, Glom Filt Rate: >90 ml/min/1.73m2 (ref >59)
Glucose: 88 mg/dL (ref 65–100)
Potassium: 3.8 mmol/L (ref 3.5–5.1)
Sodium: 139 mmol/L (ref 136–145)

## 2024-01-20 LAB — C-REACTIVE PROTEIN: CRP: 0.5 mg/dL (ref 0.0–0.5)

## 2024-01-20 LAB — MAGNESIUM: Magnesium: 2 mg/dL (ref 1.6–2.6)

## 2024-01-20 LAB — HEMOGLOBIN A1C
Estimated Avg Glucose: 109 mg/dL
Hemoglobin A1C: 5.4 % (ref 4.0–5.6)

## 2024-01-20 LAB — PHOSPHORUS: Phosphorus: 3.3 mg/dL (ref 2.5–4.5)

## 2024-01-20 MED ORDER — NALTREXONE HCL 50 MG PO TABS
50 | Freq: Every day | ORAL | Status: DC
Start: 2024-01-20 — End: 2024-01-23
  Administered 2024-01-21 – 2024-01-23 (×3): 50 mg via ORAL

## 2024-01-20 MED ORDER — PROCHLORPERAZINE EDISYLATE 10 MG/2ML IJ SOLN
10 | Freq: Four times a day (QID) | INTRAMUSCULAR | Status: DC | PRN
Start: 2024-01-20 — End: 2024-01-23

## 2024-01-20 MED ORDER — DIAZEPAM 5 MG/ML IJ SOLN
5 | INTRAMUSCULAR | Status: DC | PRN
Start: 2024-01-20 — End: 2024-01-23
  Administered 2024-01-21: 16:00:00 5 mg via INTRAVENOUS

## 2024-01-20 MED ORDER — CULTURELLE PO CAPS
Freq: Every day | ORAL | Status: DC
Start: 2024-01-20 — End: 2024-01-23
  Administered 2024-01-20 – 2024-01-23 (×4): 1 via ORAL

## 2024-01-20 MED ORDER — ENOXAPARIN SODIUM 40 MG/0.4ML IJ SOSY
40 | Freq: Every day | INTRAMUSCULAR | Status: DC
Start: 2024-01-20 — End: 2024-01-23
  Administered 2024-01-20 – 2024-01-23 (×4): 40 mg via SUBCUTANEOUS

## 2024-01-20 MED FILL — TRAZODONE HCL 50 MG PO TABS: 50 mg | ORAL | Qty: 1 | Fill #0

## 2024-01-20 MED FILL — ENOXAPARIN SODIUM 40 MG/0.4ML IJ SOSY: 40 MG/0.4ML | INTRAMUSCULAR | Qty: 0.4 | Fill #0

## 2024-01-20 MED FILL — METOPROLOL SUCCINATE ER 50 MG PO TB24: 50 mg | ORAL | Qty: 1 | Fill #0

## 2024-01-20 MED FILL — CULTURELLE IMMUNITY SUPPORT PO CAPS: ORAL | Qty: 1 | Fill #0

## 2024-01-20 MED FILL — VITAMIN B-1 100 MG PO TABS: 100 mg | ORAL | Qty: 1 | Fill #0

## 2024-01-20 MED FILL — DIVALPROEX SODIUM 125 MG PO TBEC: 125 mg | ORAL | Qty: 4 | Fill #0

## 2024-01-20 MED FILL — QUETIAPINE FUMARATE 25 MG PO TABS: 25 mg | ORAL | Qty: 2 | Fill #0

## 2024-01-20 NOTE — Plan of Care (Signed)
"    Problem: Discharge Planning  Goal: Discharge to home or other facility with appropriate resources  Outcome: Progressing     Problem: Seizure Precautions  Goal: Remains free of injury related to seizures activity  Outcome: Progressing     Problem: Risk for Elopement  Goal: Patient will not exit the unit/facility without proper excort  Outcome: Progressing     Problem: Safety - Adult  Goal: Free from fall injury  Outcome: Progressing     Problem: Pain  Goal: Verbalizes/displays adequate comfort level or baseline comfort level  Outcome: Progressing     Problem: Self Harm/Suicidality  Goal: Will have no self-injury during hospital stay  Description: INTERVENTIONS:  1.  Ensure constant observer at bedside with Q15M safety checks  2.  Maintain a safe environment  3.  Secure patient belongings  4.  Ensure family/visitors adhere to safety recommendations  5.  Ensure safety tray has been added to patient's diet order  6.  Every shift and PRN: Re-assess suicidal risk via Frequent Screener    Outcome: Progressing     "

## 2024-01-20 NOTE — Plan of Care (Signed)
 "  Problem: Occupational Therapy - Adult  Goal: By Discharge: Performs self-care activities at highest level of function for planned discharge setting.  See evaluation for individualized goals.  Description: FUNCTIONAL STATUS PRIOR TO ADMISSION:  Patient is normally independent with ADLs and mobility without use of AD/DME. He does not drive.     HOME SUPPORT: Pt reports he is homeless with no local/living family to assist him.    Occupational Therapy Goals:  Initiated 01/18/2024  1.  Patient will perform grooming, standing at sink for >2 minutes,  with Modified Independence within 7 day(s).  2.  Patient will perform lower body dressing with Modified Independence within 7 day(s).  3.  Patient will perform bathing with Supervision within 7 day(s).  4.  Patient will perform toilet transfers with Modified Independence  within 7 day(s).  5.  Patient will perform all aspects of toileting with Modified Independence within 7 day(s).  6.  Patient will participate in upper extremity therapeutic exercise/activities with Independence for 10 minutes within 7 day(s).   Outcome: Adequate for Discharge      OCCUPATIONAL THERAPY TREATMENT/DISCHARGE  Patient: Bradley [name] (58 y.o. male)  Date: 01/20/2024  Primary Diagnosis: Suicidal ideation [R45.851]  Alcohol abuse [F10.10]  Atrial fibrillation with rapid ventricular response (HCC) [I48.91]  Atrial fibrillation with RVR (HCC) [I48.91]  Closed head injury, initial encounter [S09.90XA]       Precautions: Fall Risk, General Precautions, Suicide                  Chart, occupational therapy assessment, plan of care, and goals were reviewed.    ASSESSMENT  Patient continues with skilled OT services and has progressed towards goals.  Patient is alert and oriented, follows all commands, and is more engaged with therapy, offers great efforts for all activities. Today he demonstrates much improved balance, coordination, strength, and activity tolerance, reports feeling at his baseline. No  tremors noted and HR much more stable in 70s-80s with activity. He has no LOB during serial standing ADLs and today engages in toileting, grooming, dressing, and simulated bathing tasks with overall Modified Independence. He completes shower transfer with MOD I. He denies dizziness/lightheadedness throughout all activity and is set up in chair at end of session. No further skilled acute OT services indicated at this time.               PLAN :  Discharge from acute OT    Recommendations for mobility with staff: The patient is mobilizing safely with therapy without use of additional equipment. Recommend that nursing staff allows patient to mobilize ad lib following discussion with primary shift nurse.    Recommendations for toileting with staff:  recommended toilet device: that patient be allowed up ad lib to toilet independently.     Recommendation for discharge: (in order for the patient to meet his/her long term goals):   No skilled occupational therapy    IF patient discharges home will need the following DME: none     SUBJECTIVE:   Patient stated I feel much better.    OBJECTIVE DATA SUMMARY:     Cognitive/Behavioral Status:  Orientation  Orientation Level: Oriented X4  Cognition  Overall Cognitive Status: WFL    Functional Mobility and Transfers for ADLs:  Bed Mobility:  Bed Mobility Training  Bed Mobility Training: Yes  Supine to Sit: Independent  Scooting: Independent     Transfers:   Art Therapist: Yes  Sit to Stand: Independent  Stand to Sit: Independent  Shower transfer: Modified Independent    Balance:     Balance  Sitting: Intact;Without support  Standing: Intact;Without support  Standing - Static: Good;Unsupported  Standing - Dynamic: Good;Fair;Unsupported    ADL Intervention:    Feeding: Independent     Grooming: Independent  Grooming Skilled Clinical Factors: standing at sink for hand washing and serial grooming tasks    UE Bathing: Modified independent   UE Bathing Skilled  Clinical Factors: simulated standing at sink to wash underarms and his back    LE Bathing: Modified independent   LE Bathing Skilled Clinical Factors: simulated standing at sink to wash peri area, both frontal and posterior    UE Dressing: Independent     LE Dressing: Modified independent   LE Dressing Skilled Clinical Factors: crossed leg technique to manage socks at EOB; good balance in standing to manage clothing proximally over hips    Toileting: Modified independent   Toileting Skilled Clinical Factors: standing at toilet for bladder needs; good management of clothing and hygiene without use of grab bar    Pain Rating:  Patient denied pain/10   Pain Intervention(s):   N/a    Activity Tolerance:   WNL and tolerates ADLS without rest breaks  Please refer to the flowsheet for vital signs taken during this treatment.    After treatment:   Patient left in no apparent distress sitting up in chair and Call bell within reach    COMMUNICATION/EDUCATION:   The patient's plan of care was discussed with: physical therapist, registered nurse, and case manager    Patient Education  Education Given To: Patient  Education Provided: Plan of Care;Fall Prevention Strategies  Education Method: Verbal  Barriers to Learning: None  Education Outcome: Verbalized understanding;Demonstrated understanding    Thank you for this referral.  Kalesha Irving, OT  Minutes: 10  "

## 2024-01-20 NOTE — Progress Notes (Signed)
"  Pt continues to show no signs of suicidal ideation. Asked if he had plans on hurting himself and pt replied absolutely not.   "

## 2024-01-20 NOTE — Plan of Care (Signed)
"    Problem: Safety - Adult  Goal: Free from fall injury  01/20/2024 1022 by Dempsey Kraft, RN  Outcome: Progressing  01/20/2024 0558 by Trudy Iha, RN  Outcome: Progressing     Problem: Risk for Elopement  Goal: Patient will not exit the unit/facility without proper excort  01/20/2024 0558 by Trudy Iha, RN  Outcome: Progressing     Problem: Self Harm/Suicidality  Goal: Will have no self-injury during hospital stay  Description: INTERVENTIONS:  1.  Ensure constant observer at bedside with Q15M safety checks  2.  Maintain a safe environment  3.  Secure patient belongings  4.  Ensure family/visitors adhere to safety recommendations  5.  Ensure safety tray has been added to patient's diet order  6.  Every shift and PRN: Re-assess suicidal risk via Frequent Screener    01/20/2024 1022 by Dempsey Kraft, RN  Outcome: Progressing  01/20/2024 0558 by Trudy Iha, RN  Outcome: Progressing     "

## 2024-01-20 NOTE — Consults (Signed)
"  PSYCHIATRY CONSULT NOTE    REASON FOR CONSULT: SI     The patient was seen today for follow-up evaluation. He reports feeling pretty good and denies any current suicidal ideations, marking a notable improvement from prior days when suicidal thoughts were present. The patient states that his mood is stable and that he feels more optimistic about his recovery and discharge planning.    He reports that he is tolerating his current medication regimen, specifically Seroquel  50 mg, Trazodone  50 mg daily, and Depakote  500 mg twice daily. The patient endorses good sleep and stable appetite, with no recent anxiety or mood swings.He denies any SI/HI/AH/VH.    Mental Status Examination (MSE)  Appearance/Distress: Calm, cooperative, and well-groomed; no signs of distress.  Behavior: Oriented to person, place, time, and situation; appropriate behavior.  Motor Activity: Normal; no psychomotor agitation or retardation observed.  Mood/Affect: Describes mood as pretty good; affect is bright and congruent with stated mood.  Speech/Thought: Speech is clear, coherent, and goal-directed. No flight of ideas, tangentiality, or disorganized thought content.  Perception: No auditory or visual hallucinations reported or observed.  Cognition: Alert, oriented 4; memory, attention, and concentration are intact.  Insight/Judgment: Intact; demonstrates understanding of diagnosis, treatment, and importance of medication adherence.  Risk Assessment: Denies suicidal or homicidal ideations. No evidence of psychosis or behavioral dyscontrol. Patient deemed stable and not a danger to self or others.    Assessment  F31.32 -- Bipolar II Disorder, current episode depressed, moderate.     F33.1 -- Major Depressive Disorder, recurrent, moderate.    F10.20 -- Alcohol dependence, uncomplicated.     Plan  1. Suicidal Ideation / Safety  Continue monitoring for another 24 hours for any recurrence of suicidal ideation or depressive symptoms.  If the patient  remains stable with no suicidal ideation, discontinue suicidal precautions and transition to outpatient or step-down care (IOP/PHP) rather than inpatient psychiatric hospitalization.  Psychiatry to reassess tomorrow and make a disposition recommendation based on sustained improvement.    Rationale:  The patient demonstrates stable mood, denies SI, and has protective factors including treatment adherence, outpatient support, and engagement with care. Step-down to PHP/IOP provides continued structure without unnecessary inpatient confinement.    2. Alcohol Use Disorder  Recommend starting Naltrexone  50 mg daily >1 week after last alcohol intake to help reduce cravings and risk of relapse.  Encourage participation in support groups (e.g., AA or [facility]) to reinforce sobriety.    Rationale:  Naltrexone  has proven efficacy in reducing alcohol craving and preventing relapse, especially when used alongside psychosocial support. Starting after 7+ days of abstinence reduces withdrawal symptoms, hepatic risk and optimizes medication tolerance.    3. Medication Management  Continue current regimen:  Seroquel  (Quetiapine ) 50 mg daily - for mood stabilization and sleep.  Trazodone  50 mg QHS - for insomnia.  Depakote  (Valproate) 500 mg BID - for mood stabilization.  Rationale:  Current regimen has resulted in significant clinical improvement with no adverse effects. Continued pharmacological stability is essential to sustain remission and prevent recurrence of suicidal ideation.    Thank you for your consult. Please feel free to consult us  again as needed.     Lesley Curly. DNP, PMHNP-BC    "

## 2024-01-20 NOTE — Care Coordination (Signed)
"  3:48 PM  01/20/24    Care Management Progress Note    Reason for Admission:   Suicidal ideation [R45.851]  Alcohol abuse [F10.10]  Atrial fibrillation with rapid ventricular response (HCC) [I48.91]  Atrial fibrillation with RVR (HCC) [I48.91]  Closed head injury, initial encounter [S09.90XA]       Patient Admission Status: Inpatient  Date Admitted to INP:  01/17/24  RUR: Readmission Risk Score: 16.1    Hospitalization in the last 30 days (Readmission):  No        Transition of care plan:  Pt discussed during IDR- psych following. Therapy saw pt today and cleared him.  Anticipated discharge plan: Inpatient Behavioral Health unit per psych recs. CM called Behavioral Health Access line at [phone] to ask for review and bed search update- CM provided update that therapy has cleared pt; noting that pt is back to baseline. Behavioral Access team will review and call CM back with bed update.  Date IM given: [x] NA  Outpatient follow-up.  Discharge transport: TBD, likely BLS for inpatient [facility] admission.      CM will continue to follow.    Sonny Gillis, MSW    "

## 2024-01-20 NOTE — Plan of Care (Signed)
 "  Problem: Physical Therapy - Adult  Goal: By Discharge: Performs mobility at highest level of function for planned discharge setting.  See evaluation for individualized goals.  Description: FUNCTIONAL STATUS PRIOR TO ADMISSION: Patient was independent without use of DME.    HOME SUPPORT PRIOR TO ADMISSION: Patient reports he is homeless.    Physical Therapy Goals  Initiated 01/18/2024  1.  Patient will move from supine to sit and sit to supine and roll side to side in bed with independence within 7 day(s).    2.  Patient will perform sit to stand with independence within 7 day(s).  3.  Patient will transfer from bed to chair and chair to bed with independence using the least restrictive device within 7 day(s).  4.  Patient will ambulate with independence for 100 feet with the least restrictive device within 7 day(s).   5.  Patient will score low fall risk score on standardized balance test within 7 day(s).   Outcome: Adequate for Discharge     PHYSICAL THERAPY TREATMENT/DISCHARGE    Patient: SKANDA WORLDS (58 y.o. male)  Date: 01/20/2024  Diagnosis: Suicidal ideation [R45.851]  Alcohol abuse [F10.10]  Atrial fibrillation with rapid ventricular response (HCC) [I48.91]  Atrial fibrillation with RVR (HCC) [I48.91]  Closed head injury, initial encounter [S09.90XA] Atrial fibrillation with RVR (HCC)      Precautions: Restrictions/Precautions  Restrictions/Precautions: Fall Risk, General Precautions, Suicide  Activity Level: Up with Assist            ASSESSMENT:  Patient has been followed by skilled PT services and has progressed towards goals. Patient now ambulating with improved stability, safety and activity tolerance. Patient HR stable today 68bpm at rest, 85bpm with activity and patient asymptomatic. He scores at risk for falls on Tinetti Balance test but likely at baseline. Today he is ambulatory without assistive device at distant supervision level for safety in hospital setting. He is managing all his needs  without difficulty. Patient likely nearing/at baseline functional status and he agrees. No further acute care PT needs identified, will complete PT order, patient in agreement.        PLAN:  Maximum therapeutic benefit has been met at current level of care and patient will be discharged from physical therapy at this time.    Rationale for discharge:  Return to baseline level of function    Recommendation for discharge: (in order for the patient to meet his/her long term goals):   No skilled physical therapy    Other factors to consider for discharge: no support system    IF patient discharges home will need the following DME: none     SUBJECTIVE:   Patient stated, I like you guys though.    OBJECTIVE DATA SUMMARY:   Critical Behavior:  Orientation  Orientation Level: Oriented X4  Cognition  Overall Cognitive Status: WFL    Functional Mobility Training:  Bed Mobility:  Bed Mobility Training  Bed Mobility Training: Yes  Supine to Sit: Independent  Scooting: Independent  Transfers:  Art Therapist: Yes  Sit to Stand: Independent  Stand to Sit: Independent  Balance:  Balance  Sitting: Intact;Without support  Standing: Intact;Without support  Standing - Static: Good;Unsupported  Standing - Dynamic: Good;Fair;Unsupported   Ambulation/Gait Training:     Soil Scientist: Yes  Overall Level of Assistance: Supervision  Distance (ft):  (15-44ft x 3)  Assistive Device: Gait belt  Speed/Cadence: Pace decreased (< 100 feet/min)  Step Length: Right  shortened;Left shortened  Gait Abnormalities: Decreased step clearance;Path deviations                                                                                                                                                                                                                                           Tinetti test:    Tinetti  Sitting Balance: Steady, safe  Arises: Able without using arms  Attempts to Arise: Able to arise, one  attempt  Immediate Standing Balance (First 5 Seconds): Steady without walker or other support  Standing Balance: Steady but wide stance, uses cane or other support  Nudged: Begins to fall  Eyes Closed: Steady  Turned 360 Degrees: Steadiness: Unsteady (grabs, staggers)  Turned 360 Degrees: Continuity of Steps: Continuous  Sitting Down: Safe, smooth motion  Balance Score: 12  Initiation of Gait: No hesitancy  Step Height: R Swing Foot: Right foot complete clears floor  Step Length: R Swing Foot: Passes left stance foot  Step Height: L Swing Foot: Left foot complete clears floor  Step Length: L Swing Foot: Passes right stance foot  Step Symmetry: Right and left step appear equal  Step Continuity: Steps appear continuous  Path: Mild/moderate deviation or uses walking aid  Trunk: No sway, no flexion, no use of arms, no walking aid  Walking Time: Heels almost touching while walking  Gait Score: 11  Tinetti Total Score: 23           Tinetti Tool Score Risk of Falls  <19 = High Fall Risk  19-24 = Moderate Fall Risk  25-28 = Low Fall Risk  Tinetti ME. Performance-Oriented Assessment of Mobility Problems in Elderly Patients. JAGS 1986; [id number]. (Scoring Description: PT Bulletin Feb. 10, 1993)    Older adults: Gaines et al, 2009; n = 1000 [place] elderly evaluated with ABC, POMA, ADL, and IADL)   Mean POMA score for males aged 65-79 years = 26.21(3.40)   Mean POMA score for females age 30-79 years = 25.16(4.30)   Mean POMA score for males over 80 years = 23.29(6.02)   Mean POMA score for females over 80 years = 17.20(8.32)  Pain Rating:  None reported  Pain Intervention(s):       Activity Tolerance:   Good    After treatment:   Patient left in no apparent distress sitting up in chair and PCT  present      COMMUNICATION/EDUCATION:   The patient's plan of care was discussed with: occupational therapist, registered nurse, and certified nursing assistant/patient care technician    Patient Education  Education Given To: Patient  Education Provided: Role of Therapy;Plan of Care;Mobility Training;Fall Prevention Strategies  Education Method: Verbal  Education Outcome: Verbalized understanding;Demonstrated understanding      Delon Freshwater, PT  Minutes: 10   "

## 2024-01-20 NOTE — Progress Notes (Signed)
 "[facility]  [address] [place], [place] [address]  [phone]        Hospitalist Progress Note      NAME: Bradley [name]   DOB:  [birth year]  MRM:  [id number]    Date/Time of service: 01/20/2024  12:22 PM       Subjective:     Chief Complaint:  Patient was personally seen and examined by me during this time period.  Chart reviewed.  No further alcohol w/d.  Denied HI/SI       Objective:       Vitals:       Last 24hrs VS reviewed since prior progress note. Most recent are:    Vitals:    01/20/24 1122   BP: 119/81   Pulse: 76   Resp: 16   Temp: 98.2 F (36.8 C)   SpO2: 99%     SpO2 Readings from Last 6 Encounters:   01/20/24 99%   12/18/23 98%   11/01/23 96%          Intake/Output Summary (Last 24 hours) at 01/20/2024 1222  Last data filed at 01/19/2024 1241  Gross per 24 hour   Intake 120 ml   Output --   Net 120 ml        Exam:     Physical Exam:    Gen:  disheveled, NAD  HEENT:  Pink conjunctivae, PERRL, hearing intact to voice, moist mucous membranes  Neck:  Supple, without masses, thyroid non-tender  Resp:  No accessory muscle use, clear breath sounds without wheezes rales or rhonchi  Card:  No murmurs, normal S1, S2 without thrills, bruits or peripheral edema  Abd:  Soft, non-tender, non-distended, normoactive bowel sounds are present  Musc:  No cyanosis or clubbing  Skin:  No rashes  Neuro:  Cranial nerves 3-12 are grossly intact, follows commands appropriately  Psych:  fair insight, oriented to person, place and time, alert.  No SI/HI    Medications Reviewed: (see below)    Lab Data Reviewed: (see below)    ______________________________________________________________________    Medications:     Current Facility-Administered Medications   Medication Dose Route Frequency    enoxaparin  (LOVENOX ) injection 40 mg  40 mg SubCUTAneous Daily    [START ON 01/21/2024] naltrexone  (DEPADE) tablet 50 mg  50 mg Oral Daily with breakfast    prochlorperazine  (COMPAZINE ) injection 10 mg   10 mg IntraVENous Q6H PRN    diazePAM  (VALIUM ) injection 5 mg  5 mg IntraVENous Q4H PRN    metoprolol  succinate (TOPROL  XL) extended release tablet 50 mg  50 mg Oral Daily    lactobacillus (CULTURELLE) capsule 1 capsule  1 capsule Oral Daily with breakfast    QUEtiapine  (SEROQUEL ) tablet 50 mg  50 mg Oral Nightly    sodium chloride  flush 0.9 % injection 5-40 mL  5-40 mL IntraVENous 2 times per day    sodium chloride  flush 0.9 % injection 5-40 mL  5-40 mL IntraVENous PRN    0.9 % sodium chloride  infusion   IntraVENous PRN    thiamine  mononitrate tablet 100 mg  100 mg Oral Daily    sodium chloride  flush 0.9 % injection 5-40 mL  5-40 mL IntraVENous 2 times per day    sodium chloride  flush 0.9 % injection 5-40 mL  5-40 mL IntraVENous PRN    0.9 % sodium chloride  infusion   IntraVENous PRN    ondansetron  (ZOFRAN -ODT) disintegrating tablet 4 mg  4  mg Oral Q8H PRN    Or    ondansetron  (ZOFRAN ) injection 4 mg  4 mg IntraVENous Q6H PRN    polyethylene glycol (GLYCOLAX ) packet 17 g  17 g Oral Daily PRN    acetaminophen  (TYLENOL ) tablet 650 mg  650 mg Oral Q6H PRN    Or    acetaminophen  (TYLENOL ) suppository 650 mg  650 mg Rectal Q6H PRN    traZODone  (DESYREL ) tablet 50 mg  50 mg Oral Nightly PRN    divalproex  (DEPAKOTE ) DR tablet 500 mg  500 mg Oral BID          Lab Review:     Recent Labs     01/17/24  1855 01/18/24  0133 01/19/24  0406   WBC 13.9* 9.9 6.7   HGB 16.8 13.8 12.6   HCT 47.1 40.7 37.8   PLT 306 264 184     Recent Labs     01/17/24  1855 01/18/24  0133 01/19/24  0406 01/20/24  0327   NA 142 139 137 139   K 4.4 3.8 3.7 3.8   CL 99 101 105 106   CO2 27 26 26 25    BUN 9 11 12 9    MG 2.3  --   --  2.0   PHOS  --   --   --  3.3   ALT 22  --   --   --      No results found for: GLUCPOC       Assessment / Plan:     58 yo Hx of bipolar, seizure d/o, etoh abuse, presented w/ syncope, etoh intoxication, new afib RVR, suicidal ideation.  Now in NSR.  Awaiting inpatient psych     1) Major depression/bipolar/suicidal  ideation: psych following.  Cont depakote , seroquel , trazodone  prn.  Awaiting inpatient psych.  Patient is medically cleared    2) Etoh abuse: no evidence of withdrawal.  Will start Naltrexone  to prevent craving    3) Seizure d/o: no evidence of active seizure on EEG.  Cont depakote .  Neuro was following    4) New onset afib RVR: now in NSR.  Likely related to etoh.  Echo w/ EF 55-60%.  No need for anticoagulation per Cards.  Cont toprol .  F/u with Cards outpatient     **Prior records, notes, labs, radiology, and medications reviewed in [facility]**    Total time spent with patient care: 73 Minutes **I personally saw and examined the patient during this time period**                 Care Plan discussed with: Patient, nursing     Discussed:  Care Plan    Prophylaxis:  Lovenox     Disposition:  inpatient psych           ___________________________________________________    Attending Physician: Rodrick Has, MD     "

## 2024-01-20 NOTE — Progress Notes (Signed)
"  Bedside shift change report given to Dorothy Landgrebe, RN (oncoming nurse) by Kenneth PEAK (offgoing nurse). Report included the following information Nurse Handoff Report, Adult Overview, Intake/Output, MAR, Recent Results, Cardiac Rhythm SR, and Event Log.      No acute events during partial shift to report.    Bedside shift change report given to Rosaline RN (oncoming nurse) by Fonda PEAK (offgoing nurse). Report included the following information Nurse Handoff Report, Adult Overview, Intake/Output, MAR, Recent Results, Cardiac Rhythm SR, and Event Log.      "

## 2024-01-20 NOTE — BSMART Note (Signed)
"  BSMART Liaison [name] Note     LOS:  3     Patient goal(s) for today: take medications as prescribed, make needs known in an appropriate manner, consider options for follow up  Bradley [name] focus/goals: assess needs, provide support and education, coordinate care    Progress note: Met face to face with patient in his room with sitter. He reports that he is doing okay. Denies physical concerns. Reports depression is about a 5/10 and 0/10 for anxiety. He reports his psychosocial stressors contribute to the depression score. He reports not being sure where he will stay when he leaves as he has been homeless on and off for 15 years. He reports no longer feeling in crisis and denies SI, HI, and hallucinations. He reports that he would like to go home soon. Discussed with patient that the psychiatrist will be seeing him around 9:45 and he can talk to her about this. Discusses what patient would do if he were to leave. He reports he does not have follow up. Discussed [facility], [facility], and [facility]. Patient reports he tends to stay in [place] so that would likely be the best place. He does have a list of MH referrals with [facility] information. Patient reports that he is concerned about where he will stay. He reports he could stay in his girlfriend's car but was hoping for a more stable place and not in a car. He reports his housing situation has contributed to his depression over the years. He reports wanting help but does not know how. Discussed that [facility] can assist him with finding more resources and that there are programs called [facility]SU[facility] that can also be helpful at times for support when leaving the hospital. Patient reports he does not know what he should do and will talk to the psychiatrist about plans. He will look into options so that if he and the psychiatrist decide discharge is best, he has a tentative plan.     Admission Status: Voluntary    Barriers to Discharge: placement,  housing, lack of providers  Guns in the home: No   Insurance info/prescription coverage:  [employer]   Outpatient provider(s):  None reported, to be linked to [facility]    Diagnosis:   Past Medical History:   Diagnosis Date    Abuse, drug or alcohol (HCC)     alcohol abuse    Depression         Plan:  Defer to medical [name]. Please refer to most recent psychiatric consult note for recommendations. Liaison [name] to follow up as needed for therapy and support during admission.   Follow up Psych Consult placed? Yes  Psych Consult completed? Yes   Psychiatrist updated? No        Participating treatment [name] members: Bradley [name], Bradley [name], LPC LSATP [facility], 1:1 Sitter  "

## 2024-01-21 MED FILL — DIVALPROEX SODIUM 125 MG PO TBEC: 125 mg | ORAL | Qty: 4 | Fill #0

## 2024-01-21 MED FILL — NALTREXONE HCL 50 MG PO TABS: 50 mg | ORAL | Qty: 1 | Fill #0

## 2024-01-21 MED FILL — VITAMIN B-1 100 MG PO TABS: 100 mg | ORAL | Qty: 1 | Fill #0

## 2024-01-21 MED FILL — ENOXAPARIN SODIUM 40 MG/0.4ML IJ SOSY: 40 MG/0.4ML | INTRAMUSCULAR | Qty: 0.4 | Fill #0

## 2024-01-21 MED FILL — CULTURELLE IMMUNITY SUPPORT PO CAPS: ORAL | Qty: 1 | Fill #0

## 2024-01-21 MED FILL — QUETIAPINE FUMARATE 25 MG PO TABS: 25 mg | ORAL | Qty: 2 | Fill #0

## 2024-01-21 MED FILL — DIAZEPAM 5 MG/ML IJ SOLN: 5 mg/mL | INTRAMUSCULAR | Qty: 2 | Fill #0

## 2024-01-21 MED FILL — METOPROLOL SUCCINATE ER 50 MG PO TB24: 50 mg | ORAL | Qty: 1 | Fill #0

## 2024-01-21 NOTE — Consults (Signed)
 "Psychiatric Consult Note    Patient: Bradley [name]  Age: 58 years  Date of Admission: 01/17/2024  Date of Consult: 01/21/2024  Reason for Consult: Evaluation for suicidal ideation, alcohol use, and mood stabilization in the setting of bipolar disorder and recent alcohol intoxication.    History of Present Illness    Bradley [name] is a 58 year old male with a history of Bipolar II Disorder, Major Depressive Disorder (MDD), Alcohol Use Disorder, and Atrial Fibrillation, admitted on 10/28 for atrial fibrillation with rapid ventricular response, closed head injury, alcohol intoxication, and reported suicidal ideation.    At time of interview, patient reports mood as good and denies current suicidal ideation (SI), homicidal ideation (HI), or auditory/visual hallucinations (AVH). He endorses prior SI at the time of admission but states he is feeling stable now. He attributes improvement to current inpatient medications (Depakote  and quetiapine ), which he reports have been helpful.    He reports chronic alcohol use beginning at age 37, typically consuming 4-5 40-ounce beers daily. Last drink was on day of admission. Denies other illicit drug use currently, although the ED note documents patient admitted to using crack cocaine on the day of admission and expressed homicidal ideation toward a specific woman at that time. He denies current homicidal thoughts.    He stopped taking his home medications (Depakote  and Celexa ) approximately one month prior to admission stating he was not sure that the regimen was helpful. Reports one past suicide attempt by medication overdose a long time ago, confirmed by chart review to be lithium overdose.    Past Psychiatric History    Diagnoses: Bipolar II Disorder, Major Depressive Disorder, Alcohol Use Disorder.    Past Psychiatric Admission: One previous inpatient admission (date unspecified).    Prior Suicide Attempt: One attempt via lithium overdose.    Prior Medications: Depakote ,  Celexa .    Current Medications: Depakote , Quetiapine , Trazodone , Naltrexone  (inpatient).    No current outpatient psychiatrist or therapist.    Past Medical History    Atrial fibrillation with RVR    Closed head injury (recent)    Alcohol dependence    Seizure    Family History    Denies family history of mental illness, substance use, or suicide.    Social History    Living Situation: States by myself but later disclosed that he sometimes stays at the [facility] if financially able. Currently homeless.    Employment: Unemployed; not on disability.    Substance Use:    Alcohol: Began age 84; 4-5 40-ounce beers daily.    Tobacco: 1 pack per day since age 60.    Drugs: Admitted to crack cocaine use on day of admission.    Support System: Limited; lacks outpatient mental health support.    Mental Status Examination    Appearance: Disheveled but cooperative.    Behavior: Calm, cooperative.    Speech: Normal rate and tone.    Mood: Good.    Affect: Constricted but appropriate.    Thought Process: Linear and goal-directed.    Thought Content: Denies SI, HI, AVH. No delusions noted.    Insight/Judgment: Limited; poor insight into illness and substance use.    Cognition: Alert and oriented 3.    Assessment    Bradley [name] is a 58 year old male with chronic mood disorder (Bipolar II, MDD), polysubstance use, and poor social supports, admitted following alcohol intoxication, SI, and homicidal statements. Currently, mood appears stable on inpatient regimen, and he denies SI/HI. However, he remains at elevated risk due to  recent suicidality, substance dependence, medication nonadherence, and homelessness. Insight into illness is limited, and outpatient support is lacking.    Diagnoses    F31.32 -- Bipolar II Disorder, current episode depressed    F33.1 -- Major Depressive Disorder, recurrent, moderate    F10.20 -- Alcohol Use Disorder, severe, dependence    F14.20 -- Cocaine Use Disorder, dependence    Plan and  Recommendations    Disposition: Recommend inpatient psychiatric admission for continued stabilization, safety monitoring, and coordination of substance use treatment.    Medications: Continue current inpatient regimen:    Depakote  (mood stabilization)    Quetiapine  (mood stabilization/sleep)    Trazodone  (sleep)    Naltrexone  (alcohol cravings)    Safety: Maintain suicide and violence precautions.    Substance Use: Recommend referral to inpatient dual diagnosis program once medically cleared.    Social Work: Aeronautical Engineer social work for housing and outpatient follow-up planning.    Follow-Up: Upon stabilization, coordinate with community mental health resources for continuity of care.    Geni Bihari, FNP-C, PMHNP-BC  01/21/2024  "

## 2024-01-21 NOTE — Progress Notes (Signed)
"  0700- Bedside SBAR handoff report received from Stephenie RN.Pt asleep, 1:1 sitter noted at bedside.     102- C-SSRS assesment done, pt denies thoughts or plans of hurting himself.     1900- Bedside SBAR handoff report given to [facility].  "

## 2024-01-21 NOTE — Plan of Care (Signed)
"    Problem: Discharge Planning  Goal: Discharge to home or other facility with appropriate resources  Outcome: Progressing  Flowsheets (Taken 01/20/2024 1600 by Arvil Chew, RN)  Discharge to home or other facility with appropriate resources:   Identify barriers to discharge with patient and caregiver   Arrange for needed discharge resources and transportation as appropriate   Identify discharge learning needs (meds, wound care, etc)   Refer to discharge planning if patient needs post-hospital services based on physician order or complex needs related to functional status, cognitive ability or social support system     Problem: Seizure Precautions  Goal: Remains free of injury related to seizures activity  Outcome: Progressing     Problem: Risk for Elopement  Goal: Patient will not exit the unit/facility without proper excort  Outcome: Progressing     Problem: Safety - Adult  Goal: Free from fall injury  Outcome: Progressing     Problem: Pain  Goal: Verbalizes/displays adequate comfort level or baseline comfort level  Outcome: Progressing     Problem: Self Harm/Suicidality  Goal: Will have no self-injury during hospital stay  Description: INTERVENTIONS:  1.  Ensure constant observer at bedside with Q15M safety checks  2.  Maintain a safe environment  3.  Secure patient belongings  4.  Ensure family/visitors adhere to safety recommendations  5.  Ensure safety tray has been added to patient's diet order  6.  Every shift and PRN: Re-assess suicidal risk via Frequent Screener    Outcome: Progressing     "

## 2024-01-21 NOTE — Progress Notes (Signed)
 "[facility]  [address] [place], [place] [address]  [phone]        Hospitalist Progress Note      NAME: Bradley [name]   DOB:  08-25-1965  MRM:  [id number]    Date/Time of service: 01/21/2024  12:06 PM       Subjective:     Chief Complaint:  Patient was personally seen and examined by me during this time period.  Chart reviewed.  No agitation.  No issues       Objective:       Vitals:       Last 24hrs VS reviewed since prior progress note. Most recent are:    Vitals:    01/21/24 1144   BP: 124/77   Pulse: (!) 101   Resp: 20   Temp: 97.9 F (36.6 C)   SpO2: 95%     SpO2 Readings from Last 6 Encounters:   01/21/24 95%   12/18/23 98%   11/01/23 96%          Intake/Output Summary (Last 24 hours) at 01/21/2024 1206  Last data filed at 01/21/2024 0830  Gross per 24 hour   Intake 420 ml   Output --   Net 420 ml        Exam:     Physical Exam:    Gen:  disheveled, NAD  HEENT:  Pink conjunctivae, PERRL, hearing intact to voice, moist mucous membranes  Neck:  Supple, without masses, thyroid non-tender  Resp:  No accessory muscle use, clear breath sounds without wheezes rales or rhonchi  Card:  No murmurs, normal S1, S2 without thrills, bruits or peripheral edema  Abd:  Soft, non-tender, non-distended, normoactive bowel sounds are present  Musc:  No cyanosis or clubbing  Skin:  No rashes  Neuro:  Cranial nerves 3-12 are grossly intact, follows commands appropriately  Psych:  fair insight, oriented to person, place and time, alert.  No SI/HI    Medications Reviewed: (see below)    Lab Data Reviewed: (see below)    ______________________________________________________________________    Medications:     Current Facility-Administered Medications   Medication Dose Route Frequency    enoxaparin  (LOVENOX ) injection 40 mg  40 mg SubCUTAneous Daily    naltrexone  (DEPADE) tablet 50 mg  50 mg Oral Daily with breakfast    prochlorperazine  (COMPAZINE ) injection 10 mg  10 mg IntraVENous Q6H PRN     diazePAM  (VALIUM ) injection 5 mg  5 mg IntraVENous Q4H PRN    metoprolol  succinate (TOPROL  XL) extended release tablet 50 mg  50 mg Oral Daily    lactobacillus (CULTURELLE) capsule 1 capsule  1 capsule Oral Daily with breakfast    QUEtiapine  (SEROQUEL ) tablet 50 mg  50 mg Oral Nightly    sodium chloride  flush 0.9 % injection 5-40 mL  5-40 mL IntraVENous 2 times per day    sodium chloride  flush 0.9 % injection 5-40 mL  5-40 mL IntraVENous PRN    0.9 % sodium chloride  infusion   IntraVENous PRN    thiamine  mononitrate tablet 100 mg  100 mg Oral Daily    sodium chloride  flush 0.9 % injection 5-40 mL  5-40 mL IntraVENous 2 times per day    sodium chloride  flush 0.9 % injection 5-40 mL  5-40 mL IntraVENous PRN    0.9 % sodium chloride  infusion   IntraVENous PRN    ondansetron  (ZOFRAN -ODT) disintegrating tablet 4 mg  4 mg Oral Q8H PRN  Or    ondansetron  (ZOFRAN ) injection 4 mg  4 mg IntraVENous Q6H PRN    polyethylene glycol (GLYCOLAX ) packet 17 g  17 g Oral Daily PRN    acetaminophen  (TYLENOL ) tablet 650 mg  650 mg Oral Q6H PRN    Or    acetaminophen  (TYLENOL ) suppository 650 mg  650 mg Rectal Q6H PRN    traZODone  (DESYREL ) tablet 50 mg  50 mg Oral Nightly PRN    divalproex  (DEPAKOTE ) DR tablet 500 mg  500 mg Oral BID          Lab Review:     Recent Labs     01/19/24  0406   WBC 6.7   HGB 12.6   HCT 37.8   PLT 184     Recent Labs     01/19/24  0406 01/20/24  0327   NA 137 139   K 3.7 3.8   CL 105 106   CO2 26 25   BUN 12 9   MG  --  2.0   PHOS  --  3.3     No results found for: GLUCPOC       Assessment / Plan:     58 yo Hx of bipolar, seizure d/o, etoh abuse, presented w/ syncope, etoh intoxication, new afib RVR, suicidal ideation.  Now in NSR.  Awaiting inpatient psych     1) Major depression/bipolar/suicidal ideation: Denied SI/HI.  Psych following, recommended inpatient psych.  Patient is medically cleared.  Cont depakote , seroquel , trazodone  prn.  Will re-consult psych today to see if patient still needs  inpatient admission    2) Etoh abuse: no evidence of withdrawal.  Started Naltrexone  to prevent craving    3) Seizure d/o: no evidence of active seizure on EEG.  Cont depakote .  Neuro was following    4) New onset afib RVR: now in NSR.  Likely related to etoh.  Echo w/ EF 55-60%.  No need for anticoagulation per Cards.  Cont toprol .  F/u with Cards outpatient     **Prior records, notes, labs, radiology, and medications reviewed in [facility]**    Total time spent with patient care: 32 Minutes **I personally saw and examined the patient during this time period**                 Care Plan discussed with: Patient, nursing     Discussed:  Care Plan    Prophylaxis:  Lovenox     Disposition:  inpatient psych vs home (depending on psych)           ___________________________________________________    Attending Physician: Rodrick Has, MD     "

## 2024-01-21 NOTE — Care Coordination (Addendum)
"  Care Management Progress Note    1530 update:  Per today's psychiatry consult, inpatient psychiatric admission recommended for continued stabilization, safety monitoring, and coordination of substance abuse treatment.      Reason for Admission:   Suicidal ideation [R45.851]  Alcohol abuse [F10.10]  Atrial fibrillation with rapid ventricular response (HCC) [I48.91]  Atrial fibrillation with RVR (HCC) [I48.91]  Closed head injury, initial encounter [S09.90XA]         Patient Admission Status: Inpatient  RUR: 15%  Hospitalization in the last 30 days (Readmission):  Yes        Transition of care plan:  Continues to require medical management.  Call placed to inpatient psych bed placement. No bed available today for patient.  [facility] plan]  Inpatient psych admission vs outpatient or step-down care depending on psych evaluation 01/21/24.  Discharge plan communicated with patient and/or discharge caregiver: No   Date 1st IMM letter given:   Outpatient follow-up.  Transport at discharge:  TBD depending on discharge disposition.    Glendale Girt, RN, MSN/Care manager        "

## 2024-01-21 NOTE — Progress Notes (Signed)
"  1927 Pt continues to deny any suicidal ideation or intentions to hurt himself. Pt is up to chair and in no signs of distress    2300 Pt resting comfortably and did not want vitals taken. Pt has already been medically cleared and vitals have been stable. Allowed pt to sleep.    0330 Pt refused vitals. Allowed pt to sleep  "

## 2024-01-22 MED FILL — TRAZODONE HCL 50 MG PO TABS: 50 mg | ORAL | Qty: 1 | Fill #0

## 2024-01-22 MED FILL — VITAMIN B-1 100 MG PO TABS: 100 mg | ORAL | Qty: 1 | Fill #0

## 2024-01-22 MED FILL — NALTREXONE HCL 50 MG PO TABS: 50 mg | ORAL | Qty: 1 | Fill #0

## 2024-01-22 MED FILL — CULTURELLE IMMUNITY SUPPORT PO CAPS: ORAL | Qty: 1 | Fill #0

## 2024-01-22 MED FILL — METOPROLOL SUCCINATE ER 50 MG PO TB24: 50 mg | ORAL | Qty: 1 | Fill #0

## 2024-01-22 MED FILL — QUETIAPINE FUMARATE 25 MG PO TABS: 25 mg | ORAL | Qty: 2 | Fill #0

## 2024-01-22 MED FILL — ENOXAPARIN SODIUM 40 MG/0.4ML IJ SOSY: 40 MG/0.4ML | INTRAMUSCULAR | Qty: 0.4 | Fill #0

## 2024-01-22 MED FILL — DIVALPROEX SODIUM 125 MG PO TBEC: 125 mg | ORAL | Qty: 4 | Fill #0

## 2024-01-22 NOTE — Plan of Care (Signed)
"    Problem: Discharge Planning  Goal: Discharge to home or other facility with appropriate resources  01/22/2024 0532 by Johnelle Tafolla, Dorn, RN  Outcome: Progressing  01/21/2024 1731 by Gae Bias, RN  Outcome: Progressing     Problem: Seizure Precautions  Goal: Remains free of injury related to seizures activity  01/22/2024 0532 by Na Waldrip, RN  Outcome: Progressing  01/21/2024 1731 by Gae Bias, RN  Outcome: Progressing     Problem: Risk for Elopement  Goal: Patient will not exit the unit/facility without proper excort  01/22/2024 0532 by Khani Paino, RN  Outcome: Progressing  01/21/2024 1731 by Gae Bias, RN  Outcome: Progressing     Problem: Safety - Adult  Goal: Free from fall injury  01/22/2024 0532 by Yostin Malacara, RN  Outcome: Progressing  01/21/2024 1731 by Gae Bias, RN  Outcome: Progressing     Problem: Pain  Goal: Verbalizes/displays adequate comfort level or baseline comfort level  01/22/2024 0532 by Theola Cuellar, RN  Outcome: Progressing  01/21/2024 1731 by Gae Bias, RN  Outcome: Progressing     Problem: Self Harm/Suicidality  Goal: Will have no self-injury during hospital stay  Description: INTERVENTIONS:  1.  Ensure constant observer at bedside with Q15M safety checks  2.  Maintain a safe environment  3.  Secure patient belongings  4.  Ensure family/visitors adhere to safety recommendations  5.  Ensure safety tray has been added to patient's diet order  6.  Every shift and PRN: Re-assess suicidal risk via Frequent Screener    01/22/2024 0532 by Tobie Dorn, RN  Outcome: Progressing  01/21/2024 1731 by Gae Bias, RN  Outcome: Progressing     "

## 2024-01-22 NOTE — Progress Notes (Signed)
 "[facility]  [address] [place], [place] [address]  [phone]        Hospitalist Progress Note      NAME: Bradley [name]   DOB:  [birth year]  MRM:  [id number]    Date/Time of service: 01/22/2024  9:19 AM       Subjective:     Chief Complaint:  Patient was personally seen and examined by me during this time period.  Chart reviewed.  Denied SI/HI.  Frustrated that he has to go to inpatient psych       Objective:       Vitals:       Last 24hrs VS reviewed since prior progress note. Most recent are:    Vitals:    01/22/24 0809   BP:    Pulse: 82   Resp:    Temp:    SpO2:      SpO2 Readings from Last 6 Encounters:   01/22/24 95%   12/18/23 98%   11/01/23 96%          Intake/Output Summary (Last 24 hours) at 01/22/2024 0919  Last data filed at 01/22/2024 0810  Gross per 24 hour   Intake 50 ml   Output --   Net 50 ml        Exam:     Physical Exam:    Gen:  disheveled, NAD  HEENT:  Pink conjunctivae, PERRL, hearing intact to voice, moist mucous membranes  Neck:  Supple, without masses, thyroid non-tender  Resp:  No accessory muscle use, clear breath sounds without wheezes rales or rhonchi  Card:  No murmurs, normal S1, S2 without thrills, bruits or peripheral edema  Abd:  Soft, non-tender, non-distended, normoactive bowel sounds are present  Musc:  No cyanosis or clubbing  Skin:  No rashes  Neuro:  Cranial nerves 3-12 are grossly intact, follows commands appropriately  Psych:  fair insight, oriented to person, place and time, alert.  No SI/HI    Medications Reviewed: (see below)    Lab Data Reviewed: (see below)    ______________________________________________________________________    Medications:     Current Facility-Administered Medications   Medication Dose Route Frequency    enoxaparin  (LOVENOX ) injection 40 mg  40 mg SubCUTAneous Daily    naltrexone  (DEPADE) tablet 50 mg  50 mg Oral Daily with breakfast    prochlorperazine  (COMPAZINE ) injection 10 mg  10 mg IntraVENous Q6H PRN     diazePAM  (VALIUM ) injection 5 mg  5 mg IntraVENous Q4H PRN    metoprolol  succinate (TOPROL  XL) extended release tablet 50 mg  50 mg Oral Daily    lactobacillus (CULTURELLE) capsule 1 capsule  1 capsule Oral Daily with breakfast    QUEtiapine  (SEROQUEL ) tablet 50 mg  50 mg Oral Nightly    sodium chloride  flush 0.9 % injection 5-40 mL  5-40 mL IntraVENous 2 times per day    sodium chloride  flush 0.9 % injection 5-40 mL  5-40 mL IntraVENous PRN    0.9 % sodium chloride  infusion   IntraVENous PRN    thiamine  mononitrate tablet 100 mg  100 mg Oral Daily    sodium chloride  flush 0.9 % injection 5-40 mL  5-40 mL IntraVENous 2 times per day    sodium chloride  flush 0.9 % injection 5-40 mL  5-40 mL IntraVENous PRN    0.9 % sodium chloride  infusion   IntraVENous PRN    ondansetron  (ZOFRAN -ODT) disintegrating tablet 4 mg  4 mg  Oral Q8H PRN    Or    ondansetron  (ZOFRAN ) injection 4 mg  4 mg IntraVENous Q6H PRN    polyethylene glycol (GLYCOLAX ) packet 17 g  17 g Oral Daily PRN    acetaminophen  (TYLENOL ) tablet 650 mg  650 mg Oral Q6H PRN    Or    acetaminophen  (TYLENOL ) suppository 650 mg  650 mg Rectal Q6H PRN    traZODone  (DESYREL ) tablet 50 mg  50 mg Oral Nightly PRN    divalproex  (DEPAKOTE ) DR tablet 500 mg  500 mg Oral BID          Lab Review:     No results for input(s): WBC, HGB, HCT, PLT in the last 72 hours.    Recent Labs     01/20/24  0327   NA 139   K 3.8   CL 106   CO2 25   BUN 9   MG 2.0   PHOS 3.3     No results found for: GLUCPOC       Assessment / Plan:     58 yo Hx of bipolar, seizure d/o, etoh abuse, presented w/ syncope, etoh intoxication, new afib RVR, suicidal ideation.  Now in NSR.  Awaiting inpatient psych     1) Major depression/bipolar/suicidal ideation: Denied SI/HI.  Psych following, recommended inpatient psych.  Patient is medically cleared.  Cont depakote , seroquel , trazodone  prn.  Will monitor for agitation     2) Etoh abuse: no evidence of withdrawal.  Started Naltrexone  to prevent  craving    3) Seizure d/o: no evidence of active seizure on EEG.  Cont depakote .  Neuro was following    4) New onset afib RVR: now in NSR.  Likely related to etoh.  Echo w/ EF 55-60%.  No need for anticoagulation per Cards.  Cont toprol .  F/u with Cards outpatient     **Prior records, notes, labs, radiology, and medications reviewed in [facility]**    Total time spent with patient care: 30 Minutes **I personally saw and examined the patient during this time period**                 Care Plan discussed with: Patient, nursing     Discussed:  Care Plan    Prophylaxis:  Lovenox     Disposition:  inpatient psych pending            ___________________________________________________    Attending Physician: Rodrick Has, MD     "

## 2024-01-22 NOTE — Progress Notes (Signed)
"  1900 Bedside shift change report given to Rosaline (cabin crew) by Blinda (offgoing nurse). Report included the following information Nurse Handoff Report, Adult Overview, MAR, and Recent Results    1923 Pt continues to deny any suicidal ideation or intentions to hurt himself. Pt is resting in bed with no signs of distress     0700 Bedside shift change report given to [place] (oncoming nurse) by Rosaline (offgoing nurse). Report included the following information Nurse Handoff Report, Adult Overview, MAR, and Recent Results.                              "

## 2024-01-22 NOTE — Progress Notes (Signed)
"  1600-Case management contacted regarding pt's transfer to inpatient psych rehab; no bed currently available. Pt exhibiting increased agitation and frustration related to delayed transfer.   Pt verbalized several times, What am I, in prison?, This RN provided emotional support and explained the situation to pt in order to de-escalate agitation. Pt verbalized understanding, stating he is aware of TDO route as discussed with Do MD earlier today.    1900- Bedside SBAR handoff report given to [facility] RN.  "

## 2024-01-23 ENCOUNTER — Inpatient Hospital Stay
Admission: EM | Admit: 2024-01-23 | Discharge: 2024-01-24 | Disposition: A | Discharge status: Home / Self Care | Payer: Medicaid (Managed Care) | Source: Other Acute Inpatient Hospital | Attending: Psychiatry | Admitting: Psychiatry

## 2024-01-23 DIAGNOSIS — F332 Major depressive disorder, recurrent severe without psychotic features: Secondary | ICD-10-CM

## 2024-01-23 DIAGNOSIS — T1491XA Suicide attempt, initial encounter: Secondary | ICD-10-CM

## 2024-01-23 MED ORDER — CULTURELLE PO CAPS
Freq: Every day | ORAL | Status: DC
Start: 2024-01-23 — End: 2024-01-24
  Administered 2024-01-24: 14:00:00 1 via ORAL

## 2024-01-23 MED ORDER — THIAMINE HCL 100 MG PO TABS
100 | Freq: Every day | ORAL | Status: DC
Start: 2024-01-23 — End: 2024-01-24
  Administered 2024-01-24: 14:00:00 100 mg via ORAL

## 2024-01-23 MED ORDER — NALTREXONE HCL 50 MG PO TABS
50 | Freq: Every day | ORAL | Status: DC
Start: 2024-01-23 — End: 2024-01-24
  Administered 2024-01-24: 14:00:00 50 mg via ORAL

## 2024-01-23 MED ORDER — DIPHENHYDRAMINE HCL 50 MG/ML IJ SOLN
50 | INTRAMUSCULAR | Status: DC | PRN
Start: 2024-01-23 — End: 2024-01-24

## 2024-01-23 MED ORDER — HYDROXYZINE HCL 50 MG PO TABS
50 | Freq: Three times a day (TID) | ORAL | Status: DC | PRN
Start: 2024-01-23 — End: 2024-01-24

## 2024-01-23 MED ORDER — POLYETHYLENE GLYCOL 3350 17 G PO PACK
17 | Freq: Every day | ORAL | Status: DC | PRN
Start: 2024-01-23 — End: 2024-01-24

## 2024-01-23 MED ORDER — QUETIAPINE FUMARATE 50 MG PO TABS
50 | ORAL_TABLET | Freq: Every evening | ORAL | 0 refills | 30.00000 days | Status: DC
Start: 2024-01-23 — End: 2024-01-24

## 2024-01-23 MED ORDER — HALOPERIDOL 5 MG PO TABS
5 | ORAL | Status: DC | PRN
Start: 2024-01-23 — End: 2024-01-24

## 2024-01-23 MED ORDER — ACETAMINOPHEN 325 MG PO TABS
325 | ORAL | Status: DC | PRN
Start: 2024-01-23 — End: 2024-01-24

## 2024-01-23 MED ORDER — ALUM & MAG HYDROXIDE-SIMETH 200-200-20 MG/5ML PO SUSP
200-200-20 | Freq: Four times a day (QID) | ORAL | Status: DC | PRN
Start: 2024-01-23 — End: 2024-01-24

## 2024-01-23 MED ORDER — METOPROLOL SUCCINATE ER 25 MG PO TB24
25 | Freq: Every day | ORAL | Status: DC
Start: 2024-01-23 — End: 2024-01-24
  Administered 2024-01-24: 14:00:00 50 mg via ORAL

## 2024-01-23 MED ORDER — SENNOSIDES 8.6 MG PO TABS
8.6 | Freq: Every day | ORAL | Status: DC | PRN
Start: 2024-01-23 — End: 2024-01-24

## 2024-01-23 MED ORDER — METOPROLOL SUCCINATE ER 50 MG PO TB24
50 | ORAL_TABLET | Freq: Every day | ORAL | 0 refills | 30.00000 days | Status: DC
Start: 2024-01-23 — End: 2024-01-24

## 2024-01-23 MED ORDER — TRAZODONE HCL 50 MG PO TABS
50 | Freq: Every evening | ORAL | Status: DC | PRN
Start: 2024-01-23 — End: 2024-01-24

## 2024-01-23 MED ORDER — QUETIAPINE FUMARATE 25 MG PO TABS
25 | Freq: Every evening | ORAL | Status: DC
Start: 2024-01-23 — End: 2024-01-24
  Administered 2024-01-24: 02:00:00 50 mg via ORAL

## 2024-01-23 MED ORDER — NICOTINE 21 MG/24HR TD PT24
21 | Freq: Every day | TRANSDERMAL | Status: DC
Start: 2024-01-23 — End: 2024-01-24
  Administered 2024-01-24: 14:00:00 1 via TRANSDERMAL

## 2024-01-23 MED ORDER — NALTREXONE HCL 50 MG PO TABS
50 | ORAL_TABLET | Freq: Every day | ORAL | 0 refills | 90.00000 days | Status: DC
Start: 2024-01-23 — End: 2024-01-24

## 2024-01-23 MED ORDER — HALOPERIDOL LACTATE 5 MG/ML IJ SOLN
5 | INTRAMUSCULAR | Status: DC | PRN
Start: 2024-01-23 — End: 2024-01-24

## 2024-01-23 MED ORDER — DIVALPROEX SODIUM 500 MG PO TBEC
500 | Freq: Two times a day (BID) | ORAL | Status: DC
Start: 2024-01-23 — End: 2024-01-24
  Administered 2024-01-24 (×2): 500 mg via ORAL

## 2024-01-23 MED FILL — DIVALPROEX SODIUM 125 MG PO TBEC: 125 mg | ORAL | Qty: 4 | Fill #0

## 2024-01-23 MED FILL — NALTREXONE HCL 50 MG PO TABS: 50 mg | ORAL | Qty: 1 | Fill #0

## 2024-01-23 MED FILL — CULTURELLE IMMUNITY SUPPORT PO CAPS: ORAL | Qty: 1 | Fill #0

## 2024-01-23 MED FILL — QUETIAPINE FUMARATE 25 MG PO TABS: 25 mg | ORAL | Qty: 2 | Fill #0

## 2024-01-23 MED FILL — ENOXAPARIN SODIUM 40 MG/0.4ML IJ SOSY: 40 MG/0.4ML | INTRAMUSCULAR | Qty: 0.4 | Fill #0

## 2024-01-23 MED FILL — VITAMIN B-1 100 MG PO TABS: 100 mg | ORAL | Qty: 1 | Fill #0

## 2024-01-23 MED FILL — TRAZODONE HCL 50 MG PO TABS: 50 mg | ORAL | Qty: 1 | Fill #0

## 2024-01-23 MED FILL — METOPROLOL SUCCINATE ER 50 MG PO TB24: 50 mg | ORAL | Qty: 1 | Fill #0

## 2024-01-23 NOTE — Discharge Instructions (Addendum)
 "HOSPITALIST DISCHARGE INSTRUCTIONS          NAME: Bradley [name]   DOB:  1965-04-19   MRN:  [id number]     Date/Time:  01/23/2024 11:49 AM    ADMIT DATE: 01/17/2024     DISCHARGE DATE: 01/23/2024     ADMITTING DIAGNOSIS:  Depression with suicidal ideation, alcohol abuse, new afib (but resolved)    DISCHARGE DIAGNOSIS:  same    MEDICATIONS:  @AVSMEDLIST @     It is important that you take the medication exactly as they are prescribed.   Keep your medication in the bottles provided by the pharmacist and keep a list of the medication names, dosages, and times to be taken in your wallet.   Bradley [name] not take other medications without consulting your doctor     Pain Management: per above medications    What to Bradley [name] at Home    Recommended diet:  regular diet    Recommended activity: activity as tolerated    1) Return to the hospital if you feel worse    2) If you experience any of the following symptoms then please call your primary care physician or return to the emergency room if you cannot get hold of your doctor:  Fever, chills, nausea, vomiting, diarrhea, change in mentation, falling, bleeding, shortness of breath, chest pain, severe headache, severe abdominal pain,     3) Follow up with your doctors as directed    Follow Up:  Your psychiatrist    Schedule an appointment as soon as possible for a visit in 1 week(s)      [name], [name]  [address]  [address] [place] [address]  [phone]    Schedule an appointment as soon as possible for a visit in 2 week(s)    .      Information obtained by :  I understand that if any problems occur once I am at home I am to contact my physician.    I understand and acknowledge receipt of the instructions indicated above.                                                                                                                                           Physician's or R.N.'s Signature                                                                  Date/Time  Patient or Representative Signature                                                          Date/Time         Suicidal Thoughts and Behavior: Care Instructions  Overview  You have been seen by a doctor because you've had thoughts of suicide or have harmed yourself. Your doctor and support team want to help keep you safe. Your team may include a case production designer, theatre/television/film, a child psychotherapist, and a veterinary surgeon.  People often think about suicide because they feel hopeless, helpless, or worthless. These feelings may come from having a mental health problem, such as depression. These problems can be treated.  It's important to remember that there are people who care about you. Your doctor and support team take your pain very seriously, and they want to help. Treatment and close follow-up care can help you feel better.  Follow-up care is a key part of your treatment and safety. Be sure to make and go to all appointments, and call your doctor if you are having problems. It's also a good idea to know your test results and keep a list of the medicines you take.  How can you care for yourself at home?  Where to get help 24 hours a day, 7 days a week   If you or someone you know talks about suicide, self-harm, a mental health crisis, a substance use crisis, or any other kind of emotional distress, get help right away. You can:  Call the Suicide and Crisis Lifeline at 66.  Call 1-800-273-TALK (4784156815).  Text HOME to (214)749-4256 to access the Crisis Text Line.  Consider saving these numbers in your phone.  Go to 988lifeline.org for more information or to chat online.  Other things you can Bradley [name]   Talk to someone. Be open about your feelings. Reach out to a trusted family member or friend, your doctor, or a veterinary surgeon.  Attend all counseling sessions recommended by your doctor.  Make a suicide safety plan. This is a set of  steps you can take when you feel suicidal. It includes your warning signs, coping strategies, and people you can ask for support. It's best to work with a therapist to make your plan.  Ask someone to remove and store any guns, pills, or other means of suicide.  Avoid alcohol and drug use.  Be safe with medicines. Take your medicines exactly as prescribed. Call your doctor if you think you are having a problem with your medicine.  When should you call for help?   Call 911 anytime you think you may need emergency care. For example, call if:    You feel you cannot stop from hurting yourself or someone else.   Where to get help 24 hours a day, 7 days a week   If you or someone you know talks about suicide, self-harm, a mental health crisis, a substance use crisis, or any other kind of emotional distress, get help right away. You can:    Call the Suicide and Crisis Lifeline at 28.     Call 1-800-273-TALK (6018482592).     Text HOME to 786 311 8522 to access the Crisis Text Line.   Consider saving these numbers in your phone.  Go to 988lifeline.org for more information or to  chat online.  Call your doctor now or seek immediate medical care if:    You have one or more warning signs of suicide. For example, call if:  You feel like giving away your possessions.  You use illegal drugs or drink alcohol heavily.  You talk or write about death. This may include writing suicide notes and talking about guns, knives, or pills.  You start to spend a lot of time alone or spend more time alone than usual.     You hear voices.     You start acting in an aggressive way that's not normal for you.   Watch closely for changes in your health, and be sure to contact your doctor if you have any problems.  Where can you learn more?  Go to [employer] and enter G672 to learn more about Suicidal Thoughts and Behavior: Care Instructions.  Current as of: October 20, 2022  Content Version: 14.6   2024-2025 [place],  [place].   Care instructions adapted under license by [facility]. If you have questions about a medical condition or this instruction, always ask your healthcare professional. Romayne Alderman, [facility], disclaims any warranty or liability for your use of this information.         Atrial Fibrillation: Care Instructions  Overview     Atrial fibrillation is an irregular and often fast heartbeat. Treating this condition is important for several reasons. It can cause blood clots, which can travel from your heart to your brain and cause a stroke. An irregular heartbeat can also increase your risk for heart failure. If you have an episode of atrial fibrillation, you may feel a fluttering, racing, or pounding feeling in your chest called palpitations. You may feel short of breath, lightheaded, dizzy, or weak.  Treatment can help you feel better and prevent future problems. Treatments can slow the heart rate, control the heart rhythm, or help prevent stroke. You will likely take medicine. You may have a procedure, such as electrical cardioversion or catheter ablation.  You can live well and help manage atrial fibrillation by having a heart-healthy lifestyle. This lifestyle may help reduce symptoms and how often you have episodes.  Follow-up care is a key part of your treatment and safety. Be sure to make and go to all appointments, and call your doctor if you are having problems. It's also a good idea to know your test results and keep a list of the medicines you take.  How can you care for yourself at home?  Be safe with medicines.  Take your medicines exactly as prescribed. Call your doctor if you think you are having a problem with your medicine. You will get more details on the specific medicines your doctor prescribes.  If your doctor has given you a blood thinner to prevent a stroke, be sure you get instructions about how to take your medicine safely. Blood thinners can cause serious bleeding problems.  Ruvim Risko not take any  vitamins, over-the-counter drugs, or herbal products without talking to your doctor first.  Have a heart-healthy lifestyle.  Try to quit or cut back on using tobacco and other nicotine  products. This includes smoking and vaping. Avoid secondhand smoke too.  Eat heart-healthy foods. These include vegetables, fruits, nuts, beans, lean meat, fish, and whole grains. Limit sodium and sugar.  Limit alcohol. Limit alcohol to 2 drinks a day for men and 1 drink a day for women. Avoid alcohol if it triggers symptoms.  Be active. Try to get at  least 30 minutes of physical activity on most days of the week. Talk to your doctor about what type and level of exercise is safe for you.  Stay at a weight that's healthy for you. Talk to your doctor if you need help losing weight.  Try to manage stress.  Try to get 7 to 9 hours of sleep each night.  Manage other health problems such as high blood pressure, high cholesterol, and diabetes. If you think you may have a problem with alcohol or drug use, talk to your doctor.  Avoid infections such as COVID-19, colds, and the flu. Get the flu vaccine every year. Get a pneumococcal vaccine. If you have had one before, ask your doctor whether you need another dose. Stay up to date on your COVID-19 vaccines.  Learn how to manage symptoms.  Make a symptom action plan with your doctor. This will help you know what to Taesha Goodell when you have an episode of atrial fibrillation.  Use a symptom diary to find what triggers your symptoms. If you know your triggers, you can take steps to avoid them.  Check your pulse regularly if your doctor recommends it. Place two fingers on the artery at the palm side of your wrist, in line with your thumb.  When should you call for help?   Call 911 anytime you think you may need emergency care. For example, call if:    You have symptoms of a heart attack. These may include:  Chest pain or pressure, or a strange feeling in the chest.  Sweating.  Shortness of breath.  Nausea  or vomiting.  Pain, pressure, or a strange feeling in the back, neck, jaw, or upper belly or in one or both shoulders or arms.  Lightheadedness or sudden weakness.  A fast or irregular heartbeat.  After you call 911, the operator may tell you to chew 1 adult-strength or 2 to 4 low-dose aspirin. Wait for an ambulance. Brett Soza not try to drive yourself.     You have symptoms of a stroke. These may include:  Sudden numbness, tingling, weakness, or loss of movement in your face, arm, or leg, especially on only one side of your body.  Sudden vision changes.  Sudden trouble speaking.  Sudden confusion or trouble understanding simple statements.  Sudden problems with walking or balance.  A sudden, severe headache that is different from past headaches.     You passed out (lost consciousness).   Call your doctor now or seek immediate medical care if:    You have new or increased shortness of breath.     You feel dizzy or lightheaded, or you feel like you may faint.     You have an episode of atrial fibrillation and your doctor wants you to call when you have one.     You have new or worse symptoms.   Watch closely for changes in your health, and be sure to contact your doctor if you have any problems.  Where can you learn more?  Go to [employer] and enter U020 to learn more about Atrial Fibrillation: Care Instructions.  Current as of: October 20, 2022  Content Version: 14.6   2024-2025 [place], [place].   Care instructions adapted under license by [facility]. If you have questions about a medical condition or this instruction, always ask your healthcare professional. Romayne Alderman, [facility], disclaims any warranty or liability for your use of this information.    "

## 2024-01-23 NOTE — Discharge Summary (Signed)
 "[facility]  [address] [place], [place], [place] [address]  [phone]          Hospitalist Discharge Summary     Patient ID:  Bradley [name]  [id number]  58 y.o.  [birth year]    Admit date: 01/17/2024    Discharge date and time: 01/23/2024 11:51 AM    Admission Diagnoses: Suicidal ideation [R45.851]  Alcohol abuse [F10.10]  Atrial fibrillation with rapid ventricular response (HCC) [I48.91]  Atrial fibrillation with RVR (HCC) [I48.91]  Closed head injury, initial encounter [S09.90XA]    Discharge Diagnoses:  Principal Diagnosis Depression with suicidal ideation                                            Principal Problem:    Depression with suicidal ideation  Active Problems:    Atrial fibrillation with RVR (HCC)    Syncope and collapse    Breakthrough seizure (HCC)    Alcohol intoxication in active alcoholic with delirium (HCC)  Resolved Problems:    * No resolved hospital problems. *         Hospital Course:     58 yo Hx of bipolar, seizure d/o, etoh abuse, presented w/ syncope, etoh intoxication, new afib RVR, suicidal ideation.  Now in NSR.  Will go to inpatient psych     1) Major depression/bipolar/suicidal ideation: Denied SI/HI.  Psych following, recommended inpatient psych.  Patient is medically cleared.  Cont depakote , seroquel , trazodone  prn     2) Etoh abuse: no evidence of withdrawal.  Started Naltrexone  to prevent craving     3) Seizure d/o: no evidence of active seizure on EEG.  Cont depakote .  Neuro was following     4) New onset afib RVR: now in NSR.  Likely related to etoh.  Echo w/ EF 55-60%.  No need for anticoagulation per Cards.  Cont toprol .  F/u with Cards outpatient     PCP: No primary care provider on file.     Consults: Psych, Cards     Significant Diagnostic Studies: none    Discharge Exam:  Physical Exam:    Gen: Well-developed, well-nourished, in no acute distress  HEENT:  Pink conjunctivae, PERRL, hearing intact to voice, moist mucous membranes  Neck: Supple,  without masses, thyroid non-tender  Resp: No accessory muscle use, clear breath sounds without wheezes rales or rhonchi  Card: No murmurs, normal S1, S2 without thrills, bruits or peripheral edema  Abd:  Soft, non-tender, non-distended, normoactive bowel sounds are present  Lymph:  No cervical or inguinal adenopathy  Musc: No cyanosis or clubbing  Skin: No rashes  Neuro:  Cranial nerves are grossly intact, no focal motor weakness, follows commands appropriately  Psych:  Good insight, oriented to person, place and time, alert.  Denied SI/HI    Disposition: inpatient psych  Discharge Condition: Stable    Patient Instructions:      Medication List        START taking these medications      metoprolol  succinate 50 MG extended release tablet  Commonly known as: TOPROL  XL  Take 1 tablet by mouth daily  Start taking on: January 24, 2024     naltrexone  50 MG tablet  Commonly known as: DEPADE  Take 1 tablet by mouth daily (with breakfast)  Start taking on: January 24, 2024  QUEtiapine  50 MG tablet  Commonly known as: SEROQUEL   Take 1 tablet by mouth nightly            CONTINUE taking these medications      divalproex  500 MG DR tablet  Commonly known as: DEPAKOTE   Take 1 tablet by mouth in the morning and at bedtime     traZODone  50 MG tablet  Commonly known as: DESYREL   Take 1 tablet by mouth nightly as needed for Sleep            STOP taking these medications      citalopram  10 MG tablet  Commonly known as: CELEXA                Where to Get Your Medications        Information about where to get these medications is not yet available    Ask your nurse or doctor about these medications  metoprolol  succinate 50 MG extended release tablet  naltrexone  50 MG tablet  QUEtiapine  50 MG tablet       Activity: activity as tolerated  Diet: regular diet  Wound Care: none needed    Follow-up with  Your psychiatrist    Schedule an appointment as soon as possible for a visit in 1 week(s)      Bradley [name], Bradley [name]  [address]  [place]  [address]  [place] [address]  [phone]    Schedule an appointment as soon as possible for a visit in 2 week(s)        Follow-up tests/labs none    Signed:  Rodrick Has, MD  01/23/2024  11:51 AM  **I personally spent 35 min on discharge**  "

## 2024-01-23 NOTE — Plan of Care (Signed)
"    Problem: Discharge Planning  Goal: Discharge to home or other facility with appropriate resources  01/23/2024 0314 by Onie Kasparek, Dorn, RN  Outcome: Progressing  01/22/2024 1811 by Gae Bias, RN  Outcome: Progressing     Problem: Seizure Precautions  Goal: Remains free of injury related to seizures activity  01/23/2024 0314 by Ara Mano, RN  Outcome: Progressing  01/22/2024 1811 by Gae Bias, RN  Outcome: Progressing     Problem: Risk for Elopement  Goal: Patient will not exit the unit/facility without proper excort  01/23/2024 0314 by Prakash Kimberling, RN  Outcome: Progressing  01/22/2024 1811 by Gae Bias, RN  Outcome: Progressing     Problem: Safety - Adult  Goal: Free from fall injury  01/23/2024 0314 by Magdalynn Davilla, RN  Outcome: Progressing  01/22/2024 1811 by Gae Bias, RN  Outcome: Progressing     Problem: Pain  Goal: Verbalizes/displays adequate comfort level or baseline comfort level  01/23/2024 0314 by Raleigh Jon, RN  Outcome: Progressing  01/22/2024 1811 by Gae Bias, RN  Outcome: Progressing     Problem: Self Harm/Suicidality  Goal: Will have no self-injury during hospital stay  Description: INTERVENTIONS:  1.  Ensure constant observer at bedside with Q15M safety checks  2.  Maintain a safe environment  3.  Secure patient belongings  4.  Ensure family/visitors adhere to safety recommendations  5.  Ensure safety tray has been added to patient's diet order  6.  Every shift and PRN: Re-assess suicidal risk via Frequent Screener    01/23/2024 0314 by Tobie Dorn, RN  Outcome: Progressing  01/22/2024 1811 by Gae Bias, RN  Outcome: Progressing     "

## 2024-01-23 NOTE — Consults (Addendum)
 "  [facility]    Hospitalist Consult  Primary Care Provider: No primary care provider on file.  Consult requested by: Zavage, Michelle E, MD      Subjective:     Bradley [name] is a 58 y.o. male who presents with suicidal thoughts, depression Onset of symptoms was gradual 2 weeks ago with gradually worsening course since that time. Patient is currently admitted to the [facility] service and is day 1 post admission    This is a referral, consult requested from behavioral health unit for initial medical evaluation.  Patient is a transfer from [facility].  Admitted for major depressive episode.  History of medical issues include alcohol abuse, atrial fibrillation with rapid ventricular response, history of closed head injury.  His atrial fibrillation was new onset probably related to alcohol use.  He turned into sinus rhythm without any cardioversion.  No long-term anticoagulation recommended at the time by the cardiology.  His medications include metoprolol  50 mg extended release once daily naltrexone  50 mg once daily Seroquel  50 mg once daily, Depakote  500 mg morning and bedtime trazodone  as needed 50 mg tablet for sleep.  Review of the labs suggest hemoglobin A1c of 5.4 on January 20, 2024.  C-reactive protein normal.  Basic metabolic panel from October 31 was unremarkable.  CBC showed normal hemoglobin of 12.6, hematocrit of 37.8.    e denies any headaches or dizziness. Denies any cough, chest pain, shortness of breath. Denies fever or chills. Denies nausea, vomiting, diarrhea, constipation.     Review of Systems:    A comprehensive review of systems was negative except for that written in the History of Present Illness.     Past Medical History:   Diagnosis Date    Abuse, drug or alcohol (HCC)     alcohol abuse    Depression       No past surgical history on file.  Prior to Admission medications   Medication Sig Start Date End Date Taking? Authorizing Provider    naltrexone  (DEPADE) 50 MG tablet Take 1 tablet by mouth daily (with breakfast) 01/24/24  Yes Do, Khoi B, MD   QUEtiapine  (SEROQUEL ) 50 MG tablet Take 1 tablet by mouth nightly 01/23/24  Yes Do, Khoi B, MD   metoprolol  succinate (TOPROL  XL) 50 MG extended release tablet Take 1 tablet by mouth daily 01/24/24  Yes Do, Khoi B, MD   citalopram  (CELEXA ) 20 MG tablet Take 1 tablet by mouth daily   Yes [provider]   divalproex  (DEPAKOTE ) 500 MG DR tablet Take 1 tablet by mouth in the morning and at bedtime 12/22/23  Yes Ali, Zaffar M, MD   traZODone  (DESYREL ) 50 MG tablet Take 1 tablet by mouth nightly as needed for Sleep 12/22/23  Yes Ali, Zaffar M, MD     Allergies   Allergen Reactions    Aspirin Nausea And Vomiting      No family history on file.     SOCIAL HISTORY:  Patient resides at Home.  Patient ambulates with No Assistance  Smoking history: cigarettes, 1 pack per day  Alcohol history: 5 Beers    Objective:       Physical Exam:   BP (!) 143/72   Pulse 57   Temp 97.3 F (36.3 C) (Oral)   Resp 16   Ht 1.702 m (5' 7)   Wt 74.8 kg (165 lb)   SpO2 100%   BMI 25.84 kg/m  General Appearance:    Alert, cooperative, no distress, appears stated age   Head:    Normocephalic, without obvious abnormality, atraumatic   Eyes:    PERRL, conjunctiva/corneas clear, EOM's intact, fundi     benign, both eyes        Ears:    Normal TM's and external ear canals, both ears   Nose:   Nares normal, septum midline, mucosa normal, no drainage    or sinus tenderness   Throat:   Lips, mucosa, and tongue normal; teeth and gums normal   Neck:   Supple, symmetrical, trachea midline, no adenopathy;        thyroid:  No enlargement/tenderness/nodules; no carotid    bruit or JVD   Back:     Symmetric, no curvature, ROM normal, no CVA tenderness   Lungs:     Clear to auscultation bilaterally, respirations unlabored   Chest wall:    No tenderness or deformity   Heart:    Regular rate and rhythm, S1 and S2 normal, no murmur, rub    or gallop   Abdomen:     Soft, non-tender, bowel sounds active all four quadrants,     no masses, no organomegaly   Genitalia:   D   Rectal:    D   Extremities:   Extremities normal, atraumatic, no cyanosis or edema   Pulses:   2+ and symmetric all extremities   Skin:   Skin color, texture, turgor normal, no rashes or lesions   Lymph nodes:   Cervical, supraclavicular, and axillary nodes normal   Neurologic:   CNII-XII intact. Normal strength, sensation and reflexes       throughout       ECG:  na    Data Review:   All diagnostic labs and studies have been reviewed.    Chest x-ray na    Assessment:   Bradley [name] is a 58 y.o. male who presents with major depression We are asked to see the patient in consult to assist in managing PAF    Principal Problem:    Suicidal behavior with attempted self-injury [facility])  Resolved Problems:    * No resolved hospital problems. *      Plan:       Atrial fibrillation, paroxysmal  Related to alcohol use  - Continue metoprolol  50 mg extended release.  - Monitor vitals during hospital stay.  - EKG once.    History of seizure  - Continue Depakote     Mood disorder  Depression  - As per mental health services      Contact us  if any assistance needed or concerns to be addressed    Signed By: Kathrine Pac, MD     Date of Service:  01/23/2024           "

## 2024-01-23 NOTE — BH Note (Signed)
"  Pt arrived via [facility]  Pt is voluntary admission  Pt did consent to safety while on the unit   Pt did sign consent forms up on arriving   Denies suicidal ideation   Denies homicidal ideation   Does not appear to be responding to internal stimuli      Pt was calm and cooperative during admission process. Denies any current SI/HI. Reports increased alcohol use with thoughts to end his life. Reports drinking 4-5 40's daily. Endorses a fall prior to admission due to intoxication. Pt reports good sleep on and off and good appetite until today. Denies any nausea or upset stomach. Reports chronic homelessness for about 15 years, and being non compliant with medications prior to admission to [facility]. [place]. Pt also has history of seizures. Will continue to monitor and support q29min         "

## 2024-01-23 NOTE — Plan of Care (Signed)
"    Problem: Discharge Planning  Goal: Discharge to home or other facility with appropriate resources  Outcome: Progressing     Problem: Safety - Adult  Goal: Free from fall injury  Outcome: Progressing     Problem: Self Harm/Suicidality  Goal: Will have no self-injury during hospital stay  Description: INTERVENTIONS:  1.  Ensure constant observer at bedside with Q15M safety checks  2.  Maintain a safe environment  3.  Secure patient belongings  4.  Ensure family/visitors adhere to safety recommendations  5.  Ensure safety tray has been added to patient's diet order  6.  Every shift and PRN: Re-assess suicidal risk via Frequent Screener    Outcome: Progressing     Problem: Depression  Goal: Will be euthymic at discharge  Description: INTERVENTIONS:  1. Administer medication as ordered  2. Provide emotional support via 1:1 interaction with staff  3. Encourage involvement in milieu/groups/activities  4. Monitor for social isolation  01/23/2024 2239 by Candyce Beau, RN  Outcome: Progressing     "

## 2024-01-23 NOTE — BH Note (Addendum)
"  4 Eyes Skin Assessment     NAME:  LORRY ANASTASI  DATE OF BIRTH:  Jul 09, 1965  MEDICAL RECORD NUMBER:  [id number]    The patient is being assessed for  Admission    I agree that at least one RN has performed a thorough Head to Toe Skin Assessment on the patient. ALL assessment sites listed below have been assessed.      Areas assessed by both nurses:    Head, Face, Ears, Shoulders, Back, Chest, Arms, Elbows, Hands, Sacrum. Buttock, Coccyx, Ischium, and Legs. Feet and Heels        Does the Patient have a Wound? No noted wound(s)       Braden Prevention initiated by RN: No  Wound Care Orders initiated by RN: No    For hospital-acquired stage 1 & 2 and ALL Stage 3,4, Unstageable, DTI, NWPT, and Complex wounds: place order IP Wound Care/Ostomy Nurse Eval and Treat by RN under ORDER ENTRY: No    New Ostomies, if present place, Ostomy referral order under ORDER ENTRY: No     Nurse 1 eSignature: Sydney, RN    **SHARE this note so that the co-signing nurse can place an Software Engineer**    Nurse 2 eSignature: Ayven Pheasant,RN        TRANSFER - IN REPORT:    Verbal report received from [place], RN on Nicholaos F Depp  being received from [facility] for routine progression of patient care      Report consisted of patient's Situation, Background, Assessment and   Recommendations(SBAR).     Information from the following report(s) Nurse Handoff Report, ED Encounter Summary, ED SBAR, Intake/Output, MAR, Recent Results, and Procedure Verification was reviewed with the receiving nurse.    Opportunity for questions and clarification was provided.      Assessment completed upon patient's arrival to unit and care assumed.     "

## 2024-01-23 NOTE — Plan of Care (Addendum)
"  Perfect Serve sent 1610 to hospitalist    PT calm and cooperative, flat affect. Pt ambulates independently. Endorses S/I, denies H/I, A/H and V/H.      Problem: Depression  Goal: Will be euthymic at discharge  Description: INTERVENTIONS:  1. Administer medication as ordered  2. Provide emotional support via 1:1 interaction with staff  3. Encourage involvement in milieu/groups/activities  4. Monitor for social isolation  Outcome: Progressing   Problem: Behavior  Goal: Pt/Family maintain appropriate behavior and adhere to behavioral management agreement, if implemented  Description: INTERVENTIONS:  1. Assess patient/family's coping skills and  non-compliant behavior (including use of illegal substances)  2. Notify security of behavior or suspected illegal substances which indicate the need for search of the family and/or belongings  3. Encourage verbalization of thoughts and concerns in a socially appropriate manner  4. Utilize positive, consistent limit setting strategies supporting safety of patient, staff and others  5. Encourage participation in the decision making process about the behavioral management agreement  6. If a visitor's behavior poses a threat to safety call refer to organization policy.  7. Initiate consult with Child Psychotherapist, Psychosocial CNS, Spiritual Care as appropriate  Outcome: Progressing     Problem: Anxiety  Goal: Will report anxiety at manageable levels  Description: INTERVENTIONS:  1. Administer medication as ordered  2. Teach and rehearse alternative coping skills  3. Provide emotional support with 1:1 interaction with staff  Outcome: Progressing     "

## 2024-01-23 NOTE — Progress Notes (Signed)
"  1300- SBAR report given via telephone to Kate Venice PEAK at [facility] behavioral unit on pt's transfer to Rm 727  1440- [facility] at bedside, Security on the unit with pts Medications, pts belongings given to [facility].  "

## 2024-01-23 NOTE — Care Coordination (Signed)
"  12:02 PM  01/23/24    Care Management Progress Note    Reason for Admission:   Suicidal ideation [R45.851]  Alcohol abuse [F10.10]  Atrial fibrillation with rapid ventricular response (HCC) [I48.91]  Atrial fibrillation with RVR (HCC) [I48.91]  Closed head injury, initial encounter [S09.90XA]       Patient Admission Status: Inpatient  Date Admitted to INP:  10/28  RUR: Readmission Risk Score: 11.7    Hospitalization in the last 30 days (Readmission):  Yes      Transition of care plan:  Pt discussed during IDR- CM spoke with Patient Access ([phone]) and [facility] behavioral health unit has accepted pt.    [facility] has accepted pt and has a bed today- pt will be going to room 727 and will be under the care of Dr. Zavage and NP Kristie Dames. CM met face to face with pt- he verbalized understanding/agreement.    Date IM given: [x] NA    Outpatient follow-up.    Discharge transport: [facility] scheduled for 2:30 p.m. pick up, PCS completed and packet placed in chart. CM updated MD and RN.    RN Mayowa updated to call report to [phone]; pt going to room 727.     Pt was given housing resources and encouraged to follow up; AVS updated with resource information and contact information for pt's Caseworker Tillman Harding [phone])    Sonny Gillis, MSW    "

## 2024-01-24 MED ORDER — NALTREXONE HCL 50 MG PO TABS
50 | ORAL_TABLET | Freq: Every day | ORAL | 0 refills | 30.00000 days | Status: DC
Start: 2024-01-24 — End: 2024-02-02

## 2024-01-24 MED ORDER — TRAZODONE HCL 50 MG PO TABS
50 | ORAL_TABLET | Freq: Every evening | ORAL | 0 refills | 30.00000 days | Status: DC | PRN
Start: 2024-01-24 — End: 2024-02-02

## 2024-01-24 MED ORDER — QUETIAPINE FUMARATE 50 MG PO TABS
50 | ORAL_TABLET | Freq: Every evening | ORAL | 0 refills | 30.00000 days | Status: DC
Start: 2024-01-24 — End: 2024-02-02

## 2024-01-24 MED ORDER — METOPROLOL SUCCINATE ER 50 MG PO TB24
50 | ORAL_TABLET | Freq: Every day | ORAL | 0 refills | 90.00000 days | Status: DC
Start: 2024-01-24 — End: 2024-02-02

## 2024-01-24 MED FILL — METOPROLOL SUCCINATE ER 25 MG PO TB24: 25 mg | ORAL | Qty: 2

## 2024-01-24 MED FILL — THIAMINE HCL 100 MG PO TABS: 100 mg | ORAL | Qty: 1

## 2024-01-24 MED FILL — QUETIAPINE FUMARATE 25 MG PO TABS: 25 mg | ORAL | Qty: 2 | Fill #0

## 2024-01-24 MED FILL — NICOTINE 21 MG/24HR TD PT24: 21 mg/(24.h) | TRANSDERMAL | Qty: 1

## 2024-01-24 MED FILL — NALTREXONE HCL 50 MG PO TABS: 50 mg | ORAL | Qty: 1

## 2024-01-24 MED FILL — DIVALPROEX SODIUM 500 MG PO TBEC: 500 mg | ORAL | Qty: 1 | Fill #0

## 2024-01-24 MED FILL — DIVALPROEX SODIUM 500 MG PO TBEC: 500 mg | ORAL | Qty: 1

## 2024-01-24 MED FILL — CULTURELLE PO CAPS: ORAL | Qty: 1

## 2024-01-24 NOTE — Care Coordination (Signed)
"     01/24/24 0828   ITP   Date of Plan 01/24/24   Date of Next Review 01/31/24   Barriers to Treatment Need for psychoeducation   Strengths Incorporated in Plan Acknowledging need for assistance   Plan of Care   Long Term Goal (LTG) Stated in patient/guardian terms Participate in out pt tx 12 wks   Short Term Goal 1   Short Term Goal 1 Client will remain safe during stay   Baseline Functioning SI, ETOH abuse w/ hx of seizures   Target maintain safety   Objectives Client will participate in group therapy   Intervention  Assess safety   Frequency daily   Measured by Staff observation;Self report;Behavioral data   Staff Responsible Clinical staff;BHP staff   Intervention 2 Acknowledge client strengths   Frequency daily   Measured by Staff observation;Self report;Behavioral data   Staff Responsible Clinical staff;BHP staff   Intervention 3 Group therapy   Frequency daily   Measured by Staff observation;Self report;Behavioral data   Staff Responsible Clinical staff;BHP staff   STG Goal 1 Status: Patient Appears to be  Progressing toward treatment plan goal   Short Term Goal 2   Short Term Goal 2 Client will maintain compliance with medication regime   Baseline Functioning noncompliant medications   Target medication adjustment and compliance   Intervention  Monitor medications   Frequency daily   Measured by Staff observation;Behavioral data   Staff Responsible Clinical staff;BHP staff   Intervention 2 Crisis support   Frequency daily   Measured by Staff observation;Self report;Behavioral data   Staff Responsible Clinical staff;BHP staff   Intervention 3 Milieu therapy and support;Referral to community services   Frequency daily   Measured by Staff observation;Self report;Behavioral data   Staff Responsible Clinical staff;BHP staff   STG Goal 2 Status: Patient Appears to be  Progressing toward treatment plan goal   Crisis/Safety/Discharge Plan   Crisis/Safety Plan Standard program interventions and protocol    Comprehensive Assessment Completion Date 01/24/24   Discharge Plan Pt to discharge home with follow up care       "

## 2024-01-24 NOTE — Interdisciplinary Rounds (Addendum)
"  Behavioral Health Interdisciplinary Rounds     Patient Name: Bradley [name] Age: 58  Room/Bed:  727/01  Primary Diagnosis: Suicidal behavior with attempted self-injury   Admission Status: Voluntary     Readmission within 30 days:   Power of Attorney in place:   Patient requires a blocked bed: No          Reason for blocked bed: N/A  Sleep hours: 8  Flu vaccine : planned when available      Participation in Care/Groups:  Yes  Medication Compliant?: BH Medication Compliant: Yes  PRNS (last 24 hours): BH PRNS: None    Restraints (last 24 hours):  NO  __________________________________________________  OQ Admission Analysis Survey completed:  OQ Admission Analysis Survey score:    __________________________________________________     Alcohol screening (AUDIT) completed -     Score:   Tobacco :  Illegal Drugs use:   CSSR Lifetime:     24 hour chart check complete: Yes    _______________________________________________    Patient goal(s) for today:   Treatment team focus/goals:   Progress note: Patient met with treatment team for the first time since admission and appeared with a flat mood and affect. He reports he was intoxicated and the police picked him up and took him to [place] where he said he would harm himself. He was transferred to [facility] for syncope episode while under the influence. States he was forced to come to the psych unit from [place] because I was being told I'd be TDO'd if not. States he's been drinking more due to a family death that happened a year ago. Reports he hasn't been taking his medications. He is not interested in a rehabilitation program at this time, states he wants to discharge to stay with his fiance. Wants to be back on Depakote  and Celexa . He wants to follow up with [facility], education provided about recommendation for rehabilitation but is remains disinterested. SW to contact fiance regarding discharge plan.    1230: SW spoke with Dagoberto Colla,  [phone], confirmed address for discharge as [address] [place]. Will discharge by Lyft at 2:30. Shared with fiance that he is being recommended to follow up with [facility], states she will encourage him to go.    LOS:  1 day  Expected LOS: 1    Participating treatment team members: Michelina JULIANNA Burdock, Therisa Bihari, MSW, Rocky DEL, RN, Kristie Dames, NP    "

## 2024-01-24 NOTE — Care Coordination (Signed)
"     01/24/24 0954   Audit-C   Q1: How often do you have a drink containing alcohol? 4 or more times per week   Q2: How many drinks containing alcohol do you have on a typical day when you are drinking? 10 or more   Q3: How often do you have six or more drinks on one occasion? Daily   AUDIT-C Score 12   Alcohol Screening and Brief Interventions   Would you mind if we talked for a few minutes about your alcohol use? Yes   Are you aware that your drinking (and/or drug use) can be harmful to your health? Yes   On a scale from 0-10, how ready are you to decrease your drinking? 2   We can give you some more information and recommend a follow up if you are interested. Not interested at this time       "

## 2024-01-24 NOTE — Progress Notes (Signed)
"  Pharmacist Discharge Medication Reconciliation    Discharging Provider: Kristie Dames, NP    Significant PMH:   Past Medical History:   Diagnosis Date    Abuse, drug or alcohol (HCC)     alcohol abuse    Depression      Chief Complaint for this Admission: No chief complaint on file.    Allergies: Aspirin    Discharge Medications:      Medication List        CONTINUE taking these medications      citalopram  20 MG tablet  Commonly known as: CELEXA      divalproex  500 MG DR tablet  Commonly known as: DEPAKOTE   Take 1 tablet by mouth in the morning and at bedtime     metoprolol  succinate 50 MG extended release tablet  Commonly known as: TOPROL  XL  Take 1 tablet by mouth daily     naltrexone  50 MG tablet  Commonly known as: DEPADE  Take 1 tablet by mouth daily (with breakfast)     QUEtiapine  50 MG tablet  Commonly known as: SEROQUEL   Take 1 tablet by mouth nightly     traZODone  50 MG tablet  Commonly known as: DESYREL   Take 1 tablet by mouth nightly as needed for Sleep               Where to Get Your Medications        These medications were sent to [facility] - [place], [place] - [address] - P [phone] [address] [phone]  [address] [place] [address]      Phone: [phone]   metoprolol  succinate 50 MG extended release tablet  naltrexone  50 MG tablet  QUEtiapine  50 MG tablet  traZODone  50 MG tablet         The patient's chart, MAR and AVS were reviewed by Isaiah Mainland, RPH.  "

## 2024-01-24 NOTE — Discharge Summary (Signed)
 "DISCHARGE SUMMARY    Some parts of the discharge summary are from the initial Psychiatric interview that was done on admission by the admitting psychiatrist.      Date of Admission: 01/23/2024    Date of Discharge:01/24/2024     TYPE OF DISCHARGE:   REGULAR -  YES    AMA - NO  RELEASED BY THE TDO COURT - NO    ADMISSION EVALUATION:    Patient is 58 yo caucasian male who presents from medical floor after and episode of syncope and closed head injury in the context of excessive alcohol consumption. Patient states he may have made suicidal statements while in the ED but is not suicidal at this time. Denies plan or intent to harm himself and denies feelings of SI prior to coming to the hospital. States I just don't care about life while intoxicated but has no active thoughts of suicide, states I could just never do that. Patient reports drinking 4-5 40z beers 5 out of 7 days a week. Reports worsening mental health and alcohol use since death of his last family member about one year ago. Patient was taking medications for his mental health prior to coming to the hospital and feels they have been effective for him. Patient is interested in restarting Naltrexone  for alcohol cravings, reports it has been effective in the past and he wants to cut down and eventually totally quit drinking. Patient lives with fiance who he identifies as a strong support for him. Patient is not a danger to himself or others at this time and is requesting to leave the hospital to return home and care for his mental health outpatient.     Course in the Hospital:     Patient was admitted to the inpatient psychiatry unit for acute psychiatric stabilization in regards to symptomatology as described in the HPI above and placed on Q15 minute checks. He was started back on his usual medication regimen in addition to Naltrexone  for alcohol cravings. Patients symptoms improved gradually including depression and suicidal ideation.    He was quite on  the unit, appropriate in his interactions, and cooperative with medications and the unit routine. Please see individual progress notes for more specific details regarding patient's hospitalization course. Patient was discharged to home with fiance and outpatient follow up referrals as per the plan.  No PRN medication for agitation, seclusion or restraints were required during the last 48 hours of his stay. Bradley [name] had improved to the point of being stable for discharge and outpatient FU. At this time he did not offer any complaints. Patient denied any SI or HI. Denied any AH or VH. He denied any delusions. Was not considered a danger to self or to others and is safe for discharge. Will FU with his appointments and remains motivated to be in treatment. The patient verbalized understanding of his discharge instructions.        DISCHARGE DIAGNOSIS:    Major Depressive Disorder, recurrent, severe without psychosis  Alcohol use disorder, severe       Medication List        CONTINUE taking these medications      citalopram  20 MG tablet  Commonly known as: CELEXA      divalproex  500 MG DR tablet  Commonly known as: DEPAKOTE   Take 1 tablet by mouth in the morning and at bedtime     metoprolol  succinate 50 MG extended release tablet  Commonly known as: TOPROL  XL  Take 1 tablet  by mouth daily     naltrexone  50 MG tablet  Commonly known as: DEPADE  Take 1 tablet by mouth daily (with breakfast)     QUEtiapine  50 MG tablet  Commonly known as: SEROQUEL   Take 1 tablet by mouth nightly     traZODone  50 MG tablet  Commonly known as: DESYREL   Take 1 tablet by mouth nightly as needed for Sleep               Where to Get Your Medications        These medications were sent to [facility] - [place], [place] - [address] - P [phone] [address] [facility] [phone]  [address] [place] [address]      Phone: [phone]   metoprolol  succinate 50 MG extended release tablet  naltrexone  50 MG tablet  QUEtiapine  50 MG  tablet  traZODone  50 MG tablet        Lab Results   Component Value Date/Time    VALAC 33 01/19/2024 04:06 AM     No results found for: LITHM  @FOLLOWUPSECTION @  WOUND CARE: none needed.    PROGNOSIS:   Fair based on nature of patient's pathologies and treatment compliance issues.  Prognosis is greatly dependent upon patient's ability to  follow up on psychiatric/psychotherapy appointments as well as to comply with psychiatric medications as prescribed.     "

## 2024-01-24 NOTE — BH Note (Signed)
"  Pt discharged by [employer]. Belongings returned to pt. Discharge instructions reviewed with pt.  "

## 2024-01-24 NOTE — BH Note (Signed)
"  GROUP THERAPY PROGRESS NOTE    Patient is participating in recreational therapy group.    Group time: 30 minutes    Personal goal for participation: To develop an understanding and practice of stress relief through art therapy.      Goal orientation: Personal    Group therapy participation: Passive     Therapeutic interventions reviewed and discussed:  Group members were given the opportunity to engage in conversation about their mental health challenges and strengths while coloring/painting. Group members gained an understanding of how stress relief through artistic practice can be beneficial to their mental health.     Impression of participation: SW provided pts with seasonal coloring sheets to complete independently. Pts calm, cooperative     Comer Bishop, MSW, QMHP-A  "

## 2024-01-24 NOTE — H&P (Signed)
 "Department of Psychiatry  History and Physical - Adult         CHIEF COMPLAINT:  passive SI, alcohol use     History obtained from:  patient, electronic medical record    HISTORY OF PRESENT ILLNESS:          The patient is a 58 y.o. male with significant past medical history of   Past Medical History:   Diagnosis Date    Abuse, drug or alcohol (HCC)     alcohol abuse    Depression      who presents from medical floor after and episode of syncope and closed head injury in the context of excessive alcohol consumption. Patient states he may have made suicidal statements while in the ED but is not suicidal at this time. Denies plan or intent to harm himself and denies feelings of SI prior to coming to the hospital. States I just don't care about life while intoxicated but has no active thoughts of suicide, states I could just never do that. Patient reports drinking 4-5 40z beers 5 out of 7 days a week. Reports worsening mental health and alcohol use since death of his last family member about one year ago. Patient was taking medications for his mental health prior to coming to the hospital and feels they have been effective for him. Patient is interested in restarting Naltrexone  for alcohol cravings, reports it has been effective in the past and he wants to cut down and eventually totally quit drinking. Patient lives with fiance who he identifies as a strong support for him. Patient is not a danger to himself or others at this time and is requesting to leave the hospital to return home and care for his mental health outpatient.     PSYCHIATRIC HISTORY:      The patient is currently receiving care for the above psychiatric illness.    Past mental health outpatient care includes:  1-5 treatment centers    Past mental health hospitalizations: 1-5 admissions    Lifetime Psychiatric Review of Systems         Mania or Hypomania:  no     Panic Attacks:  no     Phobias:  no     Obsessions and Compulsions:  no     Body  or Vocal Tics:  no     Hallucinations:  no     Delusions:  no    Past psychiatric medications include:  multiple medications including antidepressants, mood stabilizers, and opiate antagonists    Adverse reactions from psychotropic medications:  none    Past Medical History:        Diagnosis Date    Abuse, drug or alcohol (HCC)     alcohol abuse    Depression      Past Surgical History:    No past surgical history on file.  Medications Prior to Admission:   Medications Prior to Admission: citalopram  (CELEXA ) 20 MG tablet, Take 1 tablet by mouth daily  divalproex  (DEPAKOTE ) 500 MG DR tablet, Take 1 tablet by mouth in the morning and at bedtime  [DISCONTINUED] naltrexone  (DEPADE) 50 MG tablet, Take 1 tablet by mouth daily (with breakfast)  [DISCONTINUED] QUEtiapine  (SEROQUEL ) 50 MG tablet, Take 1 tablet by mouth nightly  [DISCONTINUED] metoprolol  succinate (TOPROL  XL) 50 MG extended release tablet, Take 1 tablet by mouth daily  [DISCONTINUED] traZODone  (DESYREL ) 50 MG tablet, Take 1 tablet by mouth nightly as needed for Sleep  Allergies:  Aspirin  Social History:    ETOH:  Current alcohol usage:  Type of Drink(s):  Beer.  Frequency of use:  5 out of 7 days a week  ACTIVITIES OF DAILY LIVING:  Patient is able to perform all activities of daily living.  MARITAL STATUS: engaged - lives with fiance      Family History:   No family history on file.  Psychiatric Family History  Patient is uncertain of psychiatric family history      PHYSICAL EXAM:    Vitals:  BP 130/65   Pulse 79   Temp 97.7 F (36.5 C) (Oral)   Resp 16   Ht 1.702 m (5' 7)   Wt 74.8 kg (165 lb)   SpO2 100%   BMI 25.84 kg/m     Mental Status Examination:    Level of consciousness:  awake, alert  Appearance:  hospital attire, fair grooming, and fair hygiene  Behavior/Motor:  no abnormalities noted  Attitude toward examiner:  cooperative  Speech:  normal rate and normal volume  Mood:  depressed  Affect:  flat  Thought processes:  linear and goal  directed  Thought content:  No hallucinations or delusions. No SI/HI  Cognition:  oriented to person, place, and time  Insight:  fair  Judgment:  fair    DIAGNOSIS:    Major Depressive Disorder, recurrent, severe without psychosis  Alcohol use disorder, severe    PLAN:  Admit to [facility] under voluntary status  Maintain safety  Medication management  Disposition planning with social worker  Estimated LOS: 5-7 days      "

## 2024-01-24 NOTE — BH Note (Signed)
 "Behavioral Health Transition Record    Patient Name: Bradley [name]  Date of Birth: 22-Apr-1965   Medical Record Number: [id number]  Date of Admission: 01/23/2024  3:51 PM   Date of Discharge: 01/24/2024    Attending Provider: Zavage, Michelle E, MD   Discharging Provider: Kristie Dames, NP    To contact this individual call [phone] and ask the operator to page.  If unavailable, ask to be transferred to [facility] Provider on call.  A Behavioral Health Provider will be available on call 24/7 and during holidays.    Primary Care Provider: No primary care provider on file.    Allergies   Allergen Reactions    Aspirin Nausea And Vomiting       Reason for Admission: CHIEF COMPLAINT:  passive SI, alcohol use      History obtained from:  patient, electronic medical record     HISTORY OF PRESENT ILLNESS:           The patient is a 58 y.o. male with significant past medical history of   Past Medical History        Past Medical History:   Diagnosis Date    Abuse, drug or alcohol (HCC)       alcohol abuse    Depression          who presents from medical floor after and episode of syncope and closed head injury in the context of excessive alcohol consumption. Patient states he may have made suicidal statements while in the ED but is not suicidal at this time. Denies plan or intent to harm himself and denies feelings of SI prior to coming to the hospital. States I just don't care about life while intoxicated but has no active thoughts of suicide, states I could just never do that. Patient reports drinking 4-5 40z beers 5 out of 7 days a week. Reports worsening mental health and alcohol use since death of his last family member about one year ago. Patient was taking medications for his mental health prior to coming to the hospital and feels they have been effective for him. Patient is interested in restarting Naltrexone  for alcohol cravings, reports it has been effective in the past and he wants to cut down and  eventually totally quit drinking. Patient lives with fiance who he identifies as a strong support for him. Patient is not a danger to himself or others at this time and is requesting to leave the hospital to return home and care for his mental health outpatient.     Admission Diagnosis: Suicidal behavior with attempted self-injury (HCC) [T14.91XA]    * No surgery found *    No results found for this visit on 01/23/24.    Immunizations administered during this encounter:   There is no immunization history on file for this patient.  Influenza Vaccination Status: None of the above/Not documented/Unable to determine from medical record documentation    Screening for Metabolic Disorders for Patients on Antipsychotic Medications  (Data obtained from the EMR)    Estimated Body Mass Index  Body mass index is 25.84 kg/m.      Vital Signs/Blood Pressure  BP 132/79   Pulse 64   Temp 97.5 F (36.4 C) (Oral)   Resp 16   Ht 1.702 m (5' 7)   Wt 74.8 kg (165 lb)   SpO2 99%   BMI 25.84 kg/m      Fasting Blood Glucose or Hemoglobin A1c  No results found for:  GLU, GLUCPOC    Hemoglobin A1C   Date Value Ref Range Status   01/20/2024 5.4 4.0 - 5.6 % Final     Comment:     Reference Range  Normal       <5.7%  Prediabetes  5.7-6.4%  Diabetes     >6.4%         Discharge Diagnosis: Major Depressive Disorder, recurrent, severe without psychosis  Alcohol use disorder, severe    Discharge Plan/Destination: The patient USMAN MILLETT exhibits the ability to control behavior in a less restrictive environment.  Patient's level of functioning is improving.  No assaultive/destructive behavior has been observed for the past 24 hours.  No suicidal/homicidal threat or behavior has been observed for the past 24 hours.  There is no evidence of serious medication side effects.  Patient has not been in physical or protective restraints for at least the past 24 hours.    If weapons involved, how are they secured? None    Is patient aware  of and in agreement with discharge plan? Yes    Arrangements for medication:  Prescriptions filled through [facility], 30 day supply provided    Copy of discharge instructions to provider?: Yes    Arrangements for transportation home: Lyft    Keep all follow up appointments as scheduled, continue to take prescribed medications per physician instructions.  Mental health crisis number:  911 or your local mental health crisis line number at [facility] at [phone].    Discharge Medication List and Instructions:      Medication List        CONTINUE taking these medications      citalopram  20 MG tablet  Commonly known as: CELEXA      divalproex  500 MG DR tablet  Commonly known as: DEPAKOTE   Take 1 tablet by mouth in the morning and at bedtime     metoprolol  succinate 50 MG extended release tablet  Commonly known as: TOPROL  XL  Take 1 tablet by mouth daily     naltrexone  50 MG tablet  Commonly known as: DEPADE  Take 1 tablet by mouth daily (with breakfast)     QUEtiapine  50 MG tablet  Commonly known as: SEROQUEL   Take 1 tablet by mouth nightly     traZODone  50 MG tablet  Commonly known as: DESYREL   Take 1 tablet by mouth nightly as needed for Sleep               Where to Get Your Medications        These medications were sent to [facility] - [place], [place] - [address] - P [phone] - F [phone]  [address] [place] [address]      Phone: [phone]   metoprolol  succinate 50 MG extended release tablet  naltrexone  50 MG tablet  QUEtiapine  50 MG tablet  traZODone  50 MG tablet         Unresulted Labs (24h ago, onward)      None            To obtain results of studies pending at discharge, please contact [phone]    Follow-up Information       Follow up With Specialties Details Why Contact Info    [facility]  Go on 01/25/2024 Please go to for mental health services including therapy, case management and medication management services. Please bring  your ID, insurance card and hospital discharge instructions to your intake.  For more information, call our Intake staff at [phone].    Intake Hours  Monday-Friday: 8 a.m.-4:30 p.m. [address] [place], [place] [address]    Phone [phone]  CRISIS: [phone]    AA  Go to Please see list of meetings provided in your chart [email or web address]    [phone]             Advanced Directive:   Does the patient have an appointed surrogate decision maker? No  Does the patient have a Medical Advance Directive? No  Does the patient have a Psychiatric Advance Directive? No  If the patient does not have a surrogate or Medical Advance Directive AND Psychiatric Advance Directive, the patient was offered information on these advance directives Patient declined to complete    Patient Instructions: Please continue all medications until otherwise directed by physician.    Tobacco Cessation Discharge Plan:   Is the patient a tobacco user  and needs referral for tobacco cessation? Yes  Patient referred to the following for tobacco cessation with an appointment? Patient refused  Patient was offered medication to assist with tobacco cessation at discharge? Patient refused    Alcohol/Substance Abuse Discharge Plan:   Does the patient have a history of substance/alcohol abuse and requires a referral for treatment? Yes  Patient referred to the following for substance/alcohol abuse treatment with an appointment? Patient refused  Patient was offered medication to assist with substance/alcohol abuse cessation at discharge? Patient refused      Patient discharged to: Home; Transition record discussed with patient/caregiver and provided this record in hard copy or electronically                     "

## 2024-01-24 NOTE — BH Note (Signed)
 "COMPREHENSIVE BEHAVIORAL HEALTH ASSESSMENT    Section A: Identifying Information  Client Name: Bradley [name] is a 58 y.o. male   Date of Assessment: 01/24/24  Assessor Name & Credentials: Therisa Bihari, MSW  Program/Service: Inpatient Behavioral Health  Date of Admission: 01/23/2024  3:51 PM  Type of Assessment: Initial     Legal Status:  Authorized Representative: no  Admission type/Commitment: Voluntary  Payee?: no    Section B: Initial Assessment  Diagnosis:  Primary Diagnosis: Suicidal behavior with attempted self-injury (HCC)  Secondary/Co-occurring Diagnoses: yes - alcohol use disorder  Presenting Needs:  Stated Needs: 59 year old male admitted from [facility] following being found by police in the community intoxicated and was found after a fall where he hit his head. Has hx of seizures from alcohol withdrawal. Admitted to medical for A-Fib, syncope and collapse. He endorsed passive SI. Denies HI and [facility].  MSE: appearance:  appropriately dressed and healthy looking, behavior:  normal, attitude:  cooperative, speech:  appropriate, mood:  euthymic, affect:  congruent with mood, orientation:  oriented in all spheres, insight:  poor, and judgment:  poor  Collateral Information: Dagoberto Venora lions, [phone]  Psychiatric Needs: Patient presents with alcohol abuse and feeling suicidal  Support Needs: psychosocial and clinical  Onset & Duration of Problems: 1+ week  Current Medical Problems:  Past Medical History:   Diagnosis Date    Abuse, drug or alcohol (HCC)     alcohol abuse    Depression      Current Medications:  Prior to Admission Medications   Prescriptions Last Dose Informant Patient Reported? Taking?   QUEtiapine  (SEROQUEL ) 50 MG tablet 01/22/2024  No No   Sig: Take 1 tablet by mouth nightly   citalopram  (CELEXA ) 20 MG tablet Past Month  Yes Yes   Sig: Take 1 tablet by mouth daily   divalproex  (DEPAKOTE ) 500 MG DR tablet 01/23/2024  No Yes   Sig: Take 1 tablet by mouth in the morning and at bedtime    metoprolol  succinate (TOPROL  XL) 50 MG extended release tablet 01/23/2024  No No   Sig: Take 1 tablet by mouth daily   naltrexone  (DEPADE) 50 MG tablet 01/23/2024  No No   Sig: Take 1 tablet by mouth daily (with breakfast)   traZODone  (DESYREL ) 50 MG tablet 01/22/2024  No No   Sig: Take 1 tablet by mouth nightly as needed for Sleep      Facility-Administered Medications: None     Substance Use/Abuse History:  Current Use: Alcohol:  amount: 4-5 40oz drinks 5 days per week  Past Use: Alcohol:     Co-occurring Mental Health & Substance Use Issues: MDD  At-Risk Behaviors (Self/Others):   History: Hx of harming self while intoxicated   Current Outpatient Providers: no    Section C: Comprehensive Assessment    Onset & Duration of Problems: 1+ week  Social, Behavioral, Developmental, & Family History:   No family history on file.  Social History     Socioeconomic History    Marital status: Widowed     Spouse name: Not on file    Number of children: Not on file    Years of education: Not on file    Highest education level: Not on file   Occupational History    Not on file   Tobacco Use    Smoking status: Every Day     Current packs/day: 1.00     Average packs/day: 1 pack/day for 10.8 years (10.8 ttl  pk-yrs)     Types: Cigarettes     Start date: 2015     Passive exposure: Current    Smokeless tobacco: Not on file   Substance and Sexual Activity    Alcohol use: Yes     Alcohol/week: 40.0 - 70.0 standard drinks of alcohol     Types: 40 - 70 Standard drinks or equivalent per week    Drug use: Not Currently    Sexual activity: Not on file   Other Topics Concern    Not on file   Social History Narrative    Not on file     Social Drivers of Health     Financial Resource Strain: High Risk (11/23/2022)    Received from [facility]    Overall Financial Resource Strain (CARDIA)     Difficulty of Paying Living Expenses: Very hard   Food Insecurity: Food Insecurity Present (01/23/2024)    Hunger Vital Sign      Worried About Running Out of Food in the Last Year: Often true     Ran Out of Food in the Last Year: Never true   Transportation Needs: Unmet Transportation Needs (01/23/2024)    PRAPARE - Therapist, Art (Medical): Yes     Lack of Transportation (Non-Medical): Yes   Physical Activity: Inactive (11/23/2022)    [facility]    Exercise Vital Sign     On average, how many days per week do you engage in moderate to strenuous exercise (like a brisk walk)?: 0 days     On average, how many minutes do you engage in exercise at this level?: 0 min   Stress: Stress Concern Present (11/23/2022)    Received from [facility]    [facility] - Occupational Stress Questionnaire     Feeling of Stress : Very much   Social Connections: Socially Isolated (11/23/2022)    Received from [facility]    Social Connection and Isolation Panel     In a typical week, how many times do you talk on the phone with family, friends, or neighbors?: More than three times a week     How often do you get together with friends or relatives?: Three times a week     How often do you attend church or religious services?: Never     Do you belong to any clubs or organizations such as church groups, unions, fraternal or athletic groups, or school groups?: No     How often do you attend meetings of the clubs or organizations you belong to?: Never     Are you married, widowed, divorced, separated, never married, or living with a partner?: Widowed   Intimate Partner Violence: Not on file   Housing Stability: High Risk (01/23/2024)    Housing Stability Vital Sign     Unable to Pay for Housing in the Last Year: Yes     Number of Times Moved in the Last Year: 0     Homeless in the Last Year: Yes     Cognitive Functioning (Strengths & Weaknesses):   Strengths: ability to follow simple commands  Weaknesses/Limitations:  judgment/insight and behavior  Employment, Hydrologist, Educational Background: [employer], Unemployed, not seeking work  Previous Interventions & Outcomes:   Prior hospitalizations: yes - [facility], 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  as of 01/23/2024       Primary Coverage       Payor Plan Insurance Group Employer/Plan Group    [place] [employer] [facility] COMMUNITY PLAN CARDINAL CARE        Payor Plan Address Payor Plan Phone Number Payor Plan Fax Number Effective Dates    [address] [phone]  08/21/2022 - None Entered    [place] [address]         Subscriber Name Subscriber Birth Date Member ID       THEOPHILE, HARVIE Aug 27, 1965 [id number]                   Health History & Current Medical Needs:  Allergies:   Allergies  Reviewed by Linward Lacinda SAUNDERS, RN on 01/23/2024        Severity Reactions Comments    Aspirin Low Nausea And Vomiting           Recent Complaints/Conditions: yes - recent fall  Nutritional Needs: no  Chronic Conditions: no  Communicable Diseases: no  Activity Restrictions: no  Special Supervision/Protocols: no  Past Illnesses/Injuries/Hospitalizations: no  Family Medical History:  No family history on file.  Substance Use (Alcohol, Prescription, Nonprescription, Illicit): yes - alcohol use  Psychiatric/Substance Use Issues:  Current Needs: Need for rehabilitation, declining services  Co-occurring Disorders: MDD  History of Use/Abuse: ETOH (freq/amount) - daily 160oz+  Risk Factors: Adult Male over age 86, Alcohol/Drug abuse, and Unemployed/Financial stressors  History of Abuse/Neglect/Trauma: yes   [] Incarceration [] Homelessness   [] Physical Abuse [] Sexual Abuse   [] Emotional Abuse [] Does not disclose/Other   [] Childhood [] Adulthood     Criminal History (Charges/Convictions/Parole/Probation): no  Daily Living Skills:  Limitations: None identified  Housing Arrangements:   The patient lives with a significant other  Address on file:  [address]  [place] [address]  Service Access & Transportation Needs: yes - needs Medicaid transportation  As applicable, and in all residential services :  Fall Risk: no  Communication Methods/Needs: no  Mobility & Adaptive Equipment: no    "

## 2024-01-24 NOTE — BH Note (Signed)
"  GROUP THERAPY PROGRESS NOTE    Patient is participating in Community group.    Group time: 10 minutes    Personal goal for participation: to provide new pts with information about unit protocol/procedures.     Goal orientation: Personal    Group therapy participation: passive     Therapeutic interventions reviewed and discussed: Members were able to gain an understanding of unit operations. FAQ sheet provided.     Impression of participation: SW provided FAQ sheet to new pts and addressed questions and/or concerns about unit protocols     Comer Bishop MSW, QMHP-A          "

## 2024-01-24 NOTE — Plan of Care (Signed)
"    Problem: Depression  Goal: Will be euthymic at discharge  Description: INTERVENTIONS:  1. Administer medication as ordered  2. Provide emotional support via 1:1 interaction with staff  3. Encourage involvement in milieu/groups/activities  4. Monitor for social isolation  01/24/2024 0951 by Rudy Rocky PARAS, RN  Outcome: Progressing  Note: Pt participated in treatment team. Denies SI/HI at this time. Before coming to the hospital he stated I didn't care. Dealing with family issues and drinking alcohol.      "

## 2024-01-24 NOTE — Interdisciplinary Rounds (Signed)
"  Behavioral Health Interdisciplinary Rounds     Patient Name: Bradley [name] Age: 58  Room/Bed:  727/01  Primary Diagnosis: Suicidal behavior with attempted self-injury   Admission Status: Voluntary     Readmission within 30 days:   Power of Attorney in place:   Patient requires a blocked bed: No          Reason for blocked bed: N/A  Sleep hours: 8  Flu vaccine : planned when available      Participation in Care/Groups:  Yes  Medication Compliant?: BH Medication Compliant: Yes  PRNS (last 24 hours): BH PRNS: None    Restraints (last 24 hours):  NO  __________________________________________________  OQ Admission Analysis Survey completed:  OQ Admission Analysis Survey score:    __________________________________________________     Alcohol screening (AUDIT) completed -     Score:   Tobacco :  Illegal Drugs use:   CSSR Lifetime:     24 hour chart check complete: Yes    _______________________________________________    Patient goal(s) for today:   Treatment team focus/goals:   Progress note:      Spiritual Care Consult:   Financial concerns/prescription coverage:    Family contact:                        Family requesting physician contact today:    Discharge plan:   Access to weapons :                                                              Outpatient provider(s):     LOS:  1 day  Expected LOS: 2 days    Participating treatment team members: Newell RN, Sarah RN, Zavage MD  "

## 2024-01-24 NOTE — Discharge Instructions (Addendum)
"  DISCHARGE SUMMARY    NAME:Bradley [name]  DOB: 06/21/1965  MRN: [id number]    The patient Bradley [name] exhibits the ability to control behavior in a less restrictive environment.  Patient's level of functioning is improving.  No assaultive/destructive behavior has been observed for the past 24 hours.  No suicidal/homicidal threat or behavior has been observed for the past 24 hours.  There is no evidence of serious medication side effects.  Patient has not been in physical or protective restraints for at least the past 24 hours.    If weapons involved, how are they secured? None    Is patient aware of and in agreement with discharge plan? Yes    Arrangements for medication:  Prescriptions filled through [facility], 30 day supply provided    Copy of discharge instructions to provider?: Yes    Arrangements for transportation home: Lyft    Keep all follow up appointments as scheduled, continue to take prescribed medications per physician instructions.  Mental health crisis number:  911 or your local mental health crisis line number at [facility] at [phone].      DISCHARGE SUMMARY from Nurse    PATIENT INSTRUCTIONS:      What to do at Home:  Recommended activity: activity as tolerated.        *  Please give a list of your current medications to your Primary Care Provider.    *  Please update this list whenever your medications are discontinued, doses are      changed, or new medications (including over-the-counter products) are added.    *  Please carry medication information at all times in case of emergency situations.    These are general instructions for a healthy lifestyle:    No smoking/ No tobacco products/ Avoid exposure to second hand smoke  Surgeon General's Warning:  Quitting smoking now greatly reduces serious risk to your health.    Obesity, smoking, and sedentary lifestyle greatly increases your risk for illness    A healthy diet, regular physical exercise & weight monitoring  are important for maintaining a healthy lifestyle    You Bradley be retaining fluid if you have a history of heart failure or if you experience any of the following symptoms:  Weight gain of 3 pounds or more overnight or 5 pounds in a week, increased swelling in our hands or feet or shortness of breath while lying flat in bed.  Please call your doctor as soon as you notice any of these symptoms; do not wait until your next office visit.        The discharge information has been reviewed with the patient.  The patient verbalized understanding.  Discharge medications reviewed with the patient and appropriate educational materials and side effects teaching were provided.  ___________________________________________________________________________________________________________________________________      Mental Health Emergency WARM LINE      1-866-400-MHAV [phone])      M-F: 9am to 9pm      Sat & Sun: 5pm - 9pm  National suicide prevention lines:                             1-800-SUICIDE 249-233-8979)       1-800-273-TALK (612)271-5348)   24/7 Crisis Text Line:  Text HOME to [phone]  "

## 2024-01-31 DIAGNOSIS — R45851 Suicidal ideations: Principal | ICD-10-CM

## 2024-01-31 NOTE — ED Notes (Addendum)
"  ED SIGN OUT NOTE  Care assumed at [facility]. [facility] 11:30 PM EST    Patient was signed out to me by Dr. Diskin.  H&P, lab and vital signs reviewed by me.  Patient resting comfortably no apparent distress denies any discomfort at this time..     Patient is awaiting bed placement by psych..    BP 138/79   Pulse 92   Temp 97.9 F (36.6 C) (Oral)   Resp 18   Ht 1.702 m (5' 7)   Wt 74.8 kg (165 lb)   SpO2 98%   BMI 25.84 kg/m     Labs Reviewed   CBC WITH AUTO DIFFERENTIAL - Abnormal; Notable for the following components:       Result Value    Lymphocytes Absolute 4.35 (*)     All other components within normal limits   COMPREHENSIVE METABOLIC PANEL - Abnormal; Notable for the following components:    Creatinine 0.67 (*)     BUN/Creatinine Ratio 10 (*)     Alk Phosphatase 130 (*)     Total Protein 8.5 (*)     All other components within normal limits   ETHANOL - Abnormal; Notable for the following components:    Ethanol Lvl 275 (*)     All other components within normal limits   URINE DRUG SCREEN - Abnormal; Notable for the following components:    Barbiturates, Urine Positive (*)     All other components within normal limits   ACETAMINOPHEN  LEVEL - Abnormal; Notable for the following components:    Acetaminophen  Level <5 (*)     All other components within normal limits   SALICYLATE LEVEL - Abnormal; Notable for the following components:    Salicylate Lvl <0.3 (*)     All other components within normal limits   URINALYSIS WITH REFLEX TO CULTURE     No orders to display       ED Course as of 02/01/24 0428   Tue Jan 31, 2024   2001 EKG interpretation: (Preliminary)  Rhythm: normal sinus rhythm; and regular . Rate (approx.): 67; Axis: normal; P wave: normal; QRS interval: normal ; ST/T wave: normal; Other findings: normal   [FD]      ED Course User Index  [FD] Diskin, Gwenn PARAS, MD       Diagnosis:   1. Suicidal ideation        Disposition:   Behavioral Health Hold 01/31/2024 09:39:37 PM    Plan:   The  patient has been accepted at [facility] by Dr. Starla.    Lillyrose Reitan, MD      Joey Elsie SAILOR, MD  02/01/24 0430       Vega Stare N, MD  02/01/24 [phone]    "

## 2024-01-31 NOTE — ED Triage Notes (Signed)
"  Pt was brought into via EMS. [place] Was found to be drinking heavily. He had a plan to harm himself by breaking a bottle and cutting himself. He also states that he wanted to kill other people   "

## 2024-01-31 NOTE — ED Notes (Signed)
"  Security at beside to wand pt for weapons.  "

## 2024-01-31 NOTE — ED Notes (Signed)
"  Blanket given to patient, pt voices no other needs at this time  "

## 2024-01-31 NOTE — BSMART Note (Signed)
"  Bsmart progress note:    Pt being recommended for admission into [facility] per Tele-psych . Pt is independent and does not use any devices to ambulate or currently have any wounds per RN    Pt reported daily drinking ETOH and is recommended to monitor for withdrawal . Per chart review Pt has been reported to suffer seizures. Pt's initial CIWA score of 12 at 8:30pm    Delon in Bed access aware   "

## 2024-01-31 NOTE — ED Notes (Incomplete)
 b

## 2024-01-31 NOTE — ED Notes (Signed)
"  TRANSFER - OUT REPORT:    Verbal report given to [facility] on Bradley [name]  being transferred to [facility] Ed for routine progression of patient care       Report consisted of patient's Situation, Background, Assessment and   Recommendations(SBAR).     Information from the following report(s) ED Encounter Summary, ED SBAR, and Recent Results was reviewed with the receiving nurse.    Lines:   Peripheral IV 01/31/24 Right Antecubital (Active)        Opportunity for questions and clarification was provided.      Patient transported with:  Monitor    "

## 2024-01-31 NOTE — ED Notes (Signed)
"  Pt agreeable to changing into green gown. Pt belongings placed in secure drawer at nurses station.  "
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
## 2024-01-31 NOTE — ED Notes (Signed)
"  TRANSFER - OUT REPORT:    Verbal report given to Randine, RN on Bradley [name]  being transferred to [facility] ED for routine progression of patient care       Report consisted of patient's Situation, Background, Assessment and   Recommendations(SBAR).     Information from the following report(s) Nurse Handoff Report, ED Encounter Summary, ED SBAR, Adult Overview, and Recent Results was reviewed with the receiving nurse.    Lines:   Peripheral IV 01/31/24 Right Antecubital (Active)        Opportunity for questions and clarification was provided.      Patient transported with:  Monitor    "

## 2024-01-31 NOTE — ED Notes (Signed)
 CIWA 0

## 2024-01-31 NOTE — ED Notes (Signed)
"  Suicide precautions ordered for patient. This RN validates suicide precautions were reviewed and put into place according to policy and listed below:  Ligature Risk Assessment reviewed with RN and constant observer- complete   Constant Observer Expectations reviewed with constant observer- complete  Suicide Room Readiness Checklist- complete  Patient belonging's secured and inventoried. complete  Wanding performed by security- complete  Medication Inventory Checklist- N/A  Suicide Documentation Checklist- complete  Suicide Patient Transfer/Discharge Form- N/A  Suicide Bundle Validation Signoff Initiated- incomplete      For the following measures incomplete the following staff were notified: Not Applicable    "

## 2024-01-31 NOTE — ED Notes (Signed)
"   Awake, sitting up in stretcher, eating food without difficulty. Pt calm and cooperative.   "

## 2024-02-01 ENCOUNTER — Inpatient Hospital Stay
Admission: EM | Admit: 2024-02-01 | Discharge: 2024-02-06 | Disposition: A | Discharge status: Home / Self Care | Payer: Medicaid (Managed Care) | Attending: Psychiatry | Admitting: Psychiatry

## 2024-02-01 ENCOUNTER — Inpatient Hospital Stay
Admit: 2024-02-01 | Discharge: 2024-02-01 | Disposition: A | Discharge status: Still In Hospital | Payer: Medicaid (Managed Care) | Arrived: AM | Attending: Emergency Medicine

## 2024-02-01 DIAGNOSIS — F319 Bipolar disorder, unspecified: Secondary | ICD-10-CM

## 2024-02-01 DIAGNOSIS — F39 Unspecified mood [affective] disorder: Secondary | ICD-10-CM

## 2024-02-01 LAB — EKG 12-LEAD
Atrial Rate: 67 {beats}/min
Atrial Rate: 69 {beats}/min
Diagnosis: NORMAL
Diagnosis: NORMAL
P Axis: 42 degrees
P Axis: 56 degrees
P-R Interval: 152 ms
P-R Interval: 150 ms
Q-T Interval: 400 ms
Q-T Interval: 384 ms
QRS Duration: 92 ms
QRS Duration: 88 ms
QTc Calculation (Bazett): 422 ms
QTc Calculation (Bazett): 411 ms
R Axis: 63 degrees
R Axis: 72 degrees
T Axis: 41 degrees
T Axis: 61 degrees
Ventricular Rate: 67 {beats}/min
Ventricular Rate: 69 {beats}/min

## 2024-02-01 LAB — CBC WITH AUTO DIFFERENTIAL
Basophils %: 0.4 % (ref 0.0–1.0)
Basophils Absolute: 0.04 K/UL (ref 0.00–0.10)
Eosinophils %: 1.7 % (ref 0.0–7.0)
Eosinophils Absolute: 0.17 K/UL (ref 0.00–0.40)
Hematocrit: 44.3 % (ref 36.6–50.3)
Hemoglobin: 15.7 g/dL (ref 12.1–17.0)
Immature Granulocytes %: 0.4 % (ref 0.0–0.5)
Immature Granulocytes Absolute: 0.04 K/UL (ref 0.00–0.04)
Lymphocytes %: 43.5 % (ref 12.0–49.0)
Lymphocytes Absolute: 4.35 K/UL — ABNORMAL HIGH (ref 0.80–3.50)
MCH: 33.1 pg (ref 26.0–34.0)
MCHC: 35.4 g/dL (ref 30.0–36.5)
MCV: 93.5 FL (ref 80.0–99.0)
MPV: 9.2 FL (ref 8.9–12.9)
Monocytes %: 9.3 % (ref 5.0–13.0)
Monocytes Absolute: 0.93 K/UL (ref 0.00–1.00)
Neutrophils %: 44.7 % (ref 32.0–75.0)
Neutrophils Absolute: 4.47 K/UL (ref 1.80–8.00)
Nucleated RBCs: 0 /100{WBCs}
Platelets: 256 K/uL (ref 150–400)
RBC: 4.74 M/uL (ref 4.10–5.70)
RDW: 13.2 % (ref 11.5–14.5)
WBC: 10 K/uL (ref 4.1–11.1)
nRBC: 0 K/uL (ref 0.00–0.01)

## 2024-02-01 LAB — URINALYSIS WITH REFLEX TO CULTURE
BACTERIA, URINE: NEGATIVE /HPF
Bilirubin, Urine: NEGATIVE
Blood, Urine: NEGATIVE
Glucose, Ur: NEGATIVE mg/dL
Ketones, Urine: NEGATIVE mg/dL
Leukocyte Esterase, Urine: NEGATIVE
Nitrite, Urine: NEGATIVE
Protein, UA: NEGATIVE mg/dL
Specific Gravity, UA: <1.005 (ref 1.003–1.030)
Urobilinogen, Urine: 0.2 EU/dL (ref 0.2–1.0)
pH, Urine: 6 (ref 5.0–8.0)

## 2024-02-01 LAB — COMPREHENSIVE METABOLIC PANEL
ALT: 16 U/L (ref 10–50)
AST: 33 U/L (ref 10–50)
Albumin/Globulin Ratio: 1.2 (ref 1.1–2.2)
Albumin: 4.7 g/dL (ref 3.5–5.2)
Alk Phosphatase: 130 U/L — ABNORMAL HIGH (ref 40–129)
Anion Gap: 12 mmol/L (ref 2–12)
BUN/Creatinine Ratio: 10 — ABNORMAL LOW (ref 12–20)
BUN: 7 mg/dL (ref 6–20)
CO2: 27 mmol/L (ref 22–29)
Calcium: 9.1 mg/dL (ref 8.6–10.0)
Chloride: 104 mmol/L (ref 98–107)
Creatinine: 0.67 mg/dL — ABNORMAL LOW (ref 0.70–1.20)
Est, Glom Filt Rate: >90 ml/min/1.73m2 (ref >60)
Globulin: 3.8 g/dL (ref 2.0–4.0)
Glucose: 79 mg/dL (ref 65–100)
Potassium: 4 mmol/L (ref 3.5–5.1)
Sodium: 143 mmol/L (ref 136–145)
Total Bilirubin: 0.3 mg/dL (ref 0.0–1.2)
Total Protein: 8.5 g/dL — ABNORMAL HIGH (ref 6.4–8.3)

## 2024-02-01 LAB — URINE DRUG SCREEN
Amphetamine, Urine: NEGATIVE
Barbiturates, Urine: POSITIVE — AB
Benzodiazepines, Urine: NEGATIVE
Cocaine, Urine: NEGATIVE
Methadone, Urine: NEGATIVE
Opiates, Urine: NEGATIVE
Phencyclidine, Urine: NEGATIVE
THC, TH-Cannabinol, Urine: NEGATIVE

## 2024-02-01 LAB — SALICYLATE LEVEL: Salicylate Lvl: <0.3 mg/dL — ABNORMAL LOW (ref 3.0–10.0)

## 2024-02-01 LAB — ETHANOL: Ethanol Lvl: 275 mg/dL — ABNORMAL HIGH (ref <10)

## 2024-02-01 LAB — ACETAMINOPHEN LEVEL: Acetaminophen Level: <5 ug/mL — ABNORMAL LOW (ref 10–30)

## 2024-02-01 MED ORDER — PHENOBARBITAL 32.4 MG PO TABS
32.4 | Freq: Four times a day (QID) | ORAL | Status: AC
Start: 2024-02-01 — End: 2024-02-02
  Administered 2024-02-01 – 2024-02-02 (×4): 64.8 mg via ORAL

## 2024-02-01 MED ORDER — PHENOBARBITAL 32.4 MG PO TABS
32.4 | Freq: Two times a day (BID) | ORAL | Status: AC
Start: 2024-02-01 — End: 2024-02-05
  Administered 2024-02-05 (×2): 16.2 mg via ORAL

## 2024-02-01 MED ORDER — ACETAMINOPHEN 325 MG PO TABS
325 | ORAL | Status: DC | PRN
Start: 2024-02-01 — End: 2024-02-06

## 2024-02-01 MED ORDER — THIAMINE HCL 100 MG PO TABS
100 | Freq: Every day | ORAL | Status: DC
Start: 2024-02-01 — End: 2024-02-06
  Administered 2024-02-01 – 2024-02-06 (×6): 100 mg via ORAL

## 2024-02-01 MED ORDER — PHENOBARBITAL 32.4 MG PO TABS
32.4 | Freq: Two times a day (BID) | ORAL | Status: AC
Start: 2024-02-01 — End: 2024-02-04
  Administered 2024-02-04 (×2): 32.4 mg via ORAL

## 2024-02-01 MED ORDER — DIVALPROEX SODIUM 500 MG PO TBEC
500 | Freq: Two times a day (BID) | ORAL | Status: DC
Start: 2024-02-01 — End: 2024-02-06
  Administered 2024-02-01 – 2024-02-06 (×11): 500 mg via ORAL

## 2024-02-01 MED ORDER — PHENOBARBITAL 32.4 MG PO TABS
32.4 | Freq: Four times a day (QID) | ORAL | Status: AC
Start: 2024-02-01 — End: 2024-02-03
  Administered 2024-02-02 – 2024-02-03 (×4): 32.4 mg via ORAL

## 2024-02-01 MED ORDER — POLYETHYLENE GLYCOL 3350 17 G PO PACK
17 | Freq: Every day | ORAL | Status: DC | PRN
Start: 2024-02-01 — End: 2024-02-06

## 2024-02-01 MED ORDER — METOPROLOL SUCCINATE ER 25 MG PO TB24
25 | Freq: Every day | ORAL | Status: DC
Start: 2024-02-01 — End: 2024-02-06
  Administered 2024-02-01 – 2024-02-06 (×6): 50 mg via ORAL

## 2024-02-01 MED ORDER — PHENOBARBITAL 32.4 MG PO TABS
32.4 | Freq: Four times a day (QID) | ORAL | Status: AC | PRN
Start: 2024-02-01 — End: 2024-02-05

## 2024-02-01 MED ORDER — QUETIAPINE FUMARATE 25 MG PO TABS
25 | Freq: Every evening | ORAL | Status: DC
Start: 2024-02-01 — End: 2024-02-06
  Administered 2024-02-02 – 2024-02-06 (×5): 50 mg via ORAL

## 2024-02-01 MED ORDER — PHENOBARBITAL 32.4 MG PO TABS
32.4 | Freq: Four times a day (QID) | ORAL | Status: AC | PRN
Start: 2024-02-01 — End: 2024-02-02

## 2024-02-01 MED ORDER — FOLIC ACID 1 MG PO TABS
1 | Freq: Every day | ORAL | Status: DC
Start: 2024-02-01 — End: 2024-02-06
  Administered 2024-02-01 – 2024-02-06 (×6): 1 mg via ORAL

## 2024-02-01 MED ORDER — TRAZODONE HCL 50 MG PO TABS
50 | Freq: Every evening | ORAL | Status: DC | PRN
Start: 2024-02-01 — End: 2024-02-06
  Administered 2024-02-03 – 2024-02-05 (×2): 50 mg via ORAL

## 2024-02-01 MED ORDER — HALOPERIDOL 5 MG PO TABS
5 | ORAL | Status: DC | PRN
Start: 2024-02-01 — End: 2024-02-06

## 2024-02-01 MED ORDER — HALOPERIDOL LACTATE 5 MG/ML IJ SOLN
5 | INTRAMUSCULAR | Status: DC | PRN
Start: 2024-02-01 — End: 2024-02-06

## 2024-02-01 MED ORDER — CITALOPRAM HYDROBROMIDE 20 MG PO TABS
20 | Freq: Every day | ORAL | Status: DC
Start: 2024-02-01 — End: 2024-02-06
  Administered 2024-02-01 – 2024-02-06 (×6): 20 mg via ORAL

## 2024-02-01 MED ORDER — PHENOBARBITAL 32.4 MG PO TABS
32.4 | Freq: Four times a day (QID) | ORAL | Status: AC | PRN
Start: 2024-02-01 — End: 2024-02-04

## 2024-02-01 MED ORDER — MULTIPLE VITAMINS PO TABS
Freq: Every day | ORAL | Status: DC
Start: 2024-02-01 — End: 2024-02-06
  Administered 2024-02-01 – 2024-02-06 (×6): 1 via ORAL

## 2024-02-01 MED ORDER — SENNOSIDES 8.6 MG PO TABS
8.6 | Freq: Every day | ORAL | Status: DC | PRN
Start: 2024-02-01 — End: 2024-02-06

## 2024-02-01 MED ORDER — HYDROXYZINE HCL 50 MG PO TABS
50 | Freq: Three times a day (TID) | ORAL | Status: DC | PRN
Start: 2024-02-01 — End: 2024-02-06
  Administered 2024-02-05: 02:00:00 50 mg via ORAL

## 2024-02-01 MED ORDER — DIPHENHYDRAMINE HCL 50 MG/ML IJ SOLN
50 | INTRAMUSCULAR | Status: DC | PRN
Start: 2024-02-01 — End: 2024-02-06

## 2024-02-01 MED ORDER — ALUM & MAG HYDROXIDE-SIMETH 200-200-20 MG/5ML PO SUSP
200-200-20 | Freq: Four times a day (QID) | ORAL | Status: DC | PRN
Start: 2024-02-01 — End: 2024-02-06

## 2024-02-01 MED FILL — PHENOBARBITAL 32.4 MG PO TABS: 32.4 mg | ORAL | Qty: 2 | Fill #0

## 2024-02-01 MED FILL — THIAMINE HCL 100 MG PO TABS: 100 mg | ORAL | Qty: 1 | Fill #0

## 2024-02-01 MED FILL — METOPROLOL SUCCINATE ER 25 MG PO TB24: 25 mg | ORAL | Qty: 2 | Fill #0

## 2024-02-01 MED FILL — THERA PO TABS: ORAL | Qty: 1 | Fill #0

## 2024-02-01 MED FILL — CITALOPRAM HYDROBROMIDE 20 MG PO TABS: 20 mg | ORAL | Qty: 1 | Fill #0

## 2024-02-01 MED FILL — DIVALPROEX SODIUM 500 MG PO TBEC: 500 mg | ORAL | Qty: 1 | Fill #0

## 2024-02-01 MED FILL — FOLIC ACID 1 MG PO TABS: 1 mg | ORAL | Qty: 1 | Fill #0

## 2024-02-01 NOTE — Care Coordination (Signed)
"     02/01/24 1500   Suicidal Ideation   Wish to be Dead Lifetime - Yes;Past 1 month - Yes   Non-Specific Active Suicidal Thoughts Past 1 month - Yes;Lifetime - Yes   Suicidal Behavior Trigger Wish to be Dead   Active Suicidal Ideation with Any Methods (Not Plan) without Intent to Act Past 1 month - Yes;Lifetime - Yes   Active Suicidal Ideation with Some Intent to Act, without Specific Plan Lifetime - Yes;Past 1 month - Yes   Active Suicidal Ideation with Specific Plan and Intent Past 1 month - Yes;Lifetime - Yes   Intensity of Ideation   Lifetime - Most Severe Ideation 5 - most severe   Lifetime - Most Recent Ideation 5 - most severe   Frequency 2-5 times in a week   Duration 1-4 hours/a lot of time   Controllability Can control thoughts with some difficulty   Deterrents Uncertain that deterrents stopped you   Reasons for Ideation Completely to end or stop the pain   Suicidal Behavior   Actual Attempt Lifetime - Yes;Past 3 months - No   Has subject engaged in Non-Suicidal Self-Injurious Behavior? Lifetime - No;Past 3 months - No   Interrupted Attempt Lifetime - Yes;Past 3 months - No   Aborted or Self-Interrupted Attempt Lifetime - Yes;Past 3 months - No   Preparatory Acts or Behavior Lifetime - No;Past 3 months - No   Actual Lethality/Medical Damage 1   Potential Lethality 2       "

## 2024-02-01 NOTE — Discharge Instructions (Addendum)
"  DISCHARGE SUMMARY    NAME:Bradley [name]  DOB: 10/24/1965  MRN: [id number]    The patient Bradley [name] exhibits the ability to control behavior in a less restrictive environment.  Patient's level of functioning is improving.  No assaultive/destructive behavior has been observed for the past 24 hours.  No suicidal/homicidal threat or behavior has been observed for the past 24 hours.  There is no evidence of serious medication side effects.  Patient has not been in physical or protective restraints for at least the past 24 hours.    If weapons involved, how are they secured? None    Is patient aware of and in agreement with discharge plan? Yes    Arrangements for medication:  Prescriptions filled through [facility], 30 day supply provided    Copy of discharge instructions to provider?:  Yes    Arrangements for transportation home: Avenues to arrange transportation    Keep all follow up appointments as scheduled, continue to take prescribed medications per physician instructions.  Mental health crisis number:  911 or your local mental health crisis line number at [facility] at [phone].      DISCHARGE SUMMARY from Nurse    PATIENT INSTRUCTIONS:        What to do at Home:  Recommended activity: activity as tolerated.        *  Please give a list of your current medications to your Primary Care Provider.    *  Please update this list whenever your medications are discontinued, doses are      changed, or new medications (including over-the-counter products) are added.    *  Please carry medication information at all times in case of emergency situations.    These are general instructions for a healthy lifestyle:    No smoking/ No tobacco products/ Avoid exposure to second hand smoke  Surgeon General's Warning:  Quitting smoking now greatly reduces serious risk to your health.    Obesity, smoking, and sedentary lifestyle greatly increases your risk for illness    A healthy diet, regular  physical exercise & weight monitoring are important for maintaining a healthy lifestyle    You may be retaining fluid if you have a history of heart failure or if you experience any of the following symptoms:  Weight gain of 3 pounds or more overnight or 5 pounds in a week, increased swelling in our hands or feet or shortness of breath while lying flat in bed.  Please call your doctor as soon as you notice any of these symptoms; do not wait until your next office visit.        The discharge information has been reviewed with the patient.  The patient verbalized understanding.  Discharge medications reviewed with the patient and appropriate educational materials and side effects teaching were provided.  ___________________________________________________________________________________________________________________________________      Mental Health Emergency WARM LINE      1-866-400-MHAV [phone])      M-F: 9am to 9pm      Sat & Sun: 5pm - 9pm  National suicide prevention lines:                             1-800-SUICIDE 779-259-7366)       1-800-273-TALK 401-762-1238)   24/7 Crisis Text Line:  Text HOME to [phone]  "

## 2024-02-01 NOTE — ED Notes (Signed)
 Attempted to given report x1.

## 2024-02-01 NOTE — Interdisciplinary Rounds (Signed)
"    Behavioral Health Interdisciplinary Rounds     Patient Name: Bradley [name]  Age: 58 y.o.  Room/Bed:  725/01  Primary Diagnosis: Mood disorder   Admission Status: Voluntary    Readmission within 30 days: No  Power of Attorney in place: No  Patient requires a blocked bed: No          Reason for blocked bed: N/A  Sleep hours:        Participation in Care/Groups:  No  Medication Compliant?: Yes  PRNS (last 24 hours): None   Restraints (last 24 hours):  No  __________________________________________________  OQ Admission Analysis Survey completed:  OQ Admission Analysis Survey score:  __________________________________________________     Alcohol screening (AUDIT) completed -     If applicable, date SBIRT discussed in treatment team AND documented:    Tobacco - patient is a smoker:    Illegal Drugs use:      24 hour chart check complete: Yes    _______________________________________________    Patient goal(s) for today:   Treatment team focus/goals:   Progress note:      Spiritual Care Consult:   Financial concerns/prescription coverage:    Family contact:                        Family requesting physician contact today:    Discharge plan:   Access to weapons :                                                              Outpatient provider(s):   Patient's preferred phone number for follow up call :   Patient's preferred e-mail address :    LOS:  0  Expected LOS:     Participating treatment team members: Bradley [name], * (assigned SW),        "

## 2024-02-01 NOTE — BH Note (Signed)
"  No group participation  "

## 2024-02-01 NOTE — ED Notes (Signed)
"  [facility]TAL[facility] forms signed and given to EMS  "

## 2024-02-01 NOTE — Progress Notes (Addendum)
 "Laboratory Monitoring for Antipsychotics:    This patient is currently prescribed the following medication(s):   Current Facility-Administered Medications: acetaminophen  (TYLENOL ) tablet 650 mg, 650 mg, Oral, Q4H PRN  polyethylene glycol (GLYCOLAX ) packet 17 g, 17 g, Oral, Daily PRN  senna (SENOKOT) tablet 8.6 mg, 1 tablet, Oral, Daily PRN  aluminum & magnesium  hydroxide-simethicone (MAALOX PLUS) 200-200-20 MG/5ML suspension 30 mL, 30 mL, Oral, Q6H PRN  hydrOXYzine  HCl (ATARAX ) tablet 50 mg, 50 mg, Oral, TID PRN  haloperidol  (HALDOL ) tablet 5 mg, 5 mg, Oral, Q4H PRN **OR** haloperidol  lactate (HALDOL ) injection 5 mg, 5 mg, IntraMUSCular, Q4H PRN  diphenhydrAMINE  (BENADRYL ) injection 50 mg, 50 mg, IntraMUSCular, Q4H PRN  traZODone  (DESYREL ) tablet 50 mg, 50 mg, Oral, Nightly PRN  citalopram  (CELEXA ) tablet 20 mg, 20 mg, Oral, Daily  divalproex  (DEPAKOTE ) DR tablet 500 mg, 500 mg, Oral, BID  metoprolol  succinate (TOPROL  XL) extended release tablet 50 mg, 50 mg, Oral, Daily  QUEtiapine  (SEROQUEL ) tablet 50 mg, 50 mg, Oral, Nightly  [COMPLETED] PHENobarbital  tablet 64.8 mg, 64.8 mg, Oral, 4x daily **FOLLOWED BY** PHENobarbital  tablet 32.4 mg, 32.4 mg, Oral, 4x daily **FOLLOWED BY** [START ON 02/03/2024] PHENobarbital  tablet 32.4 mg, 32.4 mg, Oral, BID **FOLLOWED BY** [START ON 02/04/2024] PHENobarbital  tablet 16.2 mg, 16.2 mg, Oral, BID  [EXPIRED] PHENobarbital  tablet 64.8 mg, 64.8 mg, Oral, Q6H PRN **FOLLOWED BY** PHENobarbital  tablet 32.4 mg, 32.4 mg, Oral, Q6H PRN **FOLLOWED BY** [START ON 02/04/2024] PHENobarbital  tablet 16.2 mg, 16.2 mg, Oral, Q6H PRN  thiamine  tablet 100 mg, 100 mg, Oral, Daily  folic acid  (FOLVITE ) tablet 1 mg, 1 mg, Oral, Daily  multivitamin 1 tablet, 1 tablet, Oral, Daily    The following labs have been completed for monitoring of antipsychotics and/or mood stabilizers:    Height, Weight, BMI Estimation  Estimated body mass index is 25.84 kg/m as calculated from the following:    Height as of  this encounter: 1.702 m (5' 7).    Weight as of this encounter: 74.8 kg (165 lb).     Vital Signs/Blood Pressure  BP 131/74   Pulse 67   Temp 98.3 F (36.8 C) (Oral)   Resp 19   Ht 1.702 m (5' 7)   Wt 74.8 kg (165 lb)   SpO2 99%   BMI 25.84 kg/m     Renal Function, Hepatic Function and Chemistry  Estimated Creatinine Clearance: 111 mL/min (A) (based on SCr of 0.67 mg/dL (L)).    Lab Results   Component Value Date/Time    NA 143 01/31/2024 08:05 PM    K 4.0 01/31/2024 08:05 PM    CL 104 01/31/2024 08:05 PM    CO2 27 01/31/2024 08:05 PM    BUN 7 01/31/2024 08:05 PM    GLOB 3.8 01/31/2024 08:05 PM    ALT 16 01/31/2024 08:05 PM    AST 33 01/31/2024 08:05 PM       Glucose/Hemoglobin A1c  No results found for: GLU, GLUCPOC  Lab Results   Component Value Date/Time    LABA1C 5.4 01/20/2024 03:27 AM    EAG 109 01/20/2024 03:27 AM       Hematology  Lab Results   Component Value Date/Time    WBC 10.0 01/31/2024 08:05 PM    RBC 4.74 01/31/2024 08:05 PM    HGB 15.7 01/31/2024 08:05 PM    HCT 44.3 01/31/2024 08:05 PM    MCV 93.5 01/31/2024 08:05 PM    MCH 33.1 01/31/2024 08:05 PM    MCHC 35.4 01/31/2024  08:05 PM    RDW 13.2 01/31/2024 08:05 PM    PLT 256 01/31/2024 08:05 PM       Lipids  Lab Results   Component Value Date/Time    CHOL 172 02/02/2024 07:08 AM    TRIG 165 02/02/2024 07:08 AM    HDL 69 02/02/2024 07:08 AM    CHOLHDLRATIO 2.5 02/02/2024 07:08 AM       Thyroid Function  No results found for: TSH, TSH2, TSH3, TSHELE, T3RIA, T3UP, FT3, FT4, T4, TT7    Assessment/Plan:  Recommended baseline laboratory monitoring has been completed based on this patient's current medication regimen. The patient's estimated 10-year ASCVD risk is 10%; therefore, the patient is a candidate for a high-intensity statin. Recommend initiation of atorvastatin 40mg  daily.  Follow-up metabolic monitoring labs should be completed in 3 months (quarterly for the first year of antipsychotic therapy).     Isaiah Mainland, RPH    "

## 2024-02-01 NOTE — Plan of Care (Signed)
"    Problem: Drug Abuse/Detox  Goal: Will have no detox symptoms and will verbalize plan for changing drug-related behavior  Description: INTERVENTIONS:  1. Administer medication as ordered  2. Monitor physical status  3. Provide emotional support with 1:1 interaction with staff  4. Encourage  recovery focused treatment   Outcome: Progressing  Note: Daily etoh use is 4 to 5 40oz beer daily and binges on cocaine montly. Insight into his addiction and states he needs to stop but is conflicted to engage in sobriety. Tx team discussed benefits of sobriety plan and options of programing. Pt verbalized understanding     "

## 2024-02-01 NOTE — BH Note (Signed)
"  Per Basir  NP, patient accepted from [facility] to [facility] and does not require a 1:1 while on BHU.   "

## 2024-02-01 NOTE — BH Note (Signed)
"  TRANSFER - IN REPORT:    Verbal report received from Angelique N., RN on Bradley [name]  being received from [facility] for routine progression of patient care      Report consisted of patient's Situation, Background, Assessment and   Recommendations(SBAR).     Information from the following report(s) ED Encounter Summary and ED SBAR was reviewed with the receiving nurse.    Opportunity for questions and clarification was provided.      Assessment completed upon patient's arrival to unit and care assumed.    "
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
## 2024-02-01 NOTE — Plan of Care (Signed)
"    Problem: Safety - Adult  Goal: Free from fall injury  Outcome: Progressing     Pt received resting in bed with eyes closed. Respirations are even and unlabored. Pt has no visible signs of distress or discomfort. Walkways are free and clear from clutter. Plan of care is ongoing.  "

## 2024-02-01 NOTE — BH Note (Addendum)
 "COMPREHENSIVE BEHAVIORAL HEALTH ASSESSMENT    Section A: Identifying Information  Client Name: Bradley [name] is a 58 y.o. male   Date of Assessment: 02/01/24  Assessor Name & Credentials: Therisa Bihari, MSW  Program/Service: Inpatient Behavioral Health  Date of Admission: 02/01/2024  7:55 AM  Type of Assessment: Initial     Legal Status:  Authorized Representative: no  Admission type/Commitment: Voluntary  Payee?: no    Section B: Initial Assessment  Diagnosis:  Primary Diagnosis: Mood disorder  Secondary/Co-occurring Diagnoses: yes - Alcohol Use Disorder  Presenting Needs:  Stated Needs: 58 year old male admitted from [facility] ED endorsing SI with plan to cut his wrists with glass bottles, arrived to ED with BAL of 275. He reports getting into an argument with his significant other and was kicked out of the home, was found by EMS [place]. Additional stressors include losing his sister to lung cancer. Also endorsed wanting to harm others. He denies [facility].  MSE: appearance:  disheveled and looks older, behavior:  normal, attitude:  cooperative, speech:  appropriate, mood:  depressed, affect:  congruent with mood, orientation:  oriented in all spheres, insight:  fair , and judgment:  poor  Collateral Information: None provided  Psychiatric Needs: Patient presents with alcohol abuse and depression worse  Support Needs: spiritual, psychosocial, and clinical  Onset & Duration of Problems: 6+ weeks  Current Medical Problems:  Past Medical History:   Diagnosis Date    Abuse, drug or alcohol (HCC)     alcohol abuse    Depression      Current Medications:  Prior to Admission Medications   Prescriptions Last Dose Informant Patient Reported? Taking?   QUEtiapine  (SEROQUEL ) 50 MG tablet Past Month  No Yes   Sig: Take 1 tablet by mouth nightly   citalopram  (CELEXA ) 20 MG tablet Past Month  Yes Yes   Sig: Take 1 tablet by mouth daily   divalproex  (DEPAKOTE ) 500 MG DR tablet Past Month  No Yes   Sig: Take  1 tablet by mouth in the morning and at bedtime   metoprolol  succinate (TOPROL  XL) 50 MG extended release tablet Past Month  No Yes   Sig: Take 1 tablet by mouth daily   naltrexone  (DEPADE) 50 MG tablet Not Taking  No No   Sig: Take 1 tablet by mouth daily (with breakfast)   Patient not taking: Reported on 02/01/2024   traZODone  (DESYREL ) 50 MG tablet Past Month  No Yes   Sig: Take 1 tablet by mouth nightly as needed for Sleep      Facility-Administered Medications: None     Substance Use/Abuse History:  Current Use: Alcohol:  frequency: daily, 5-6 40oz drinks and Cocaine:  frequency: monthly  Past Use: Alcohol:    and Cocaine:     Co-occurring Mental Health & Substance Use Issues: MDD  At-Risk Behaviors (Self/Others):   History:    Current Outpatient Providers: no    Section C: Comprehensive Assessment    Onset & Duration of Problems: 6+ weeks  Social, Behavioral, Developmental, & Family History:   No family history on file.  Social History     Socioeconomic History    Marital status: Widowed     Spouse name: Not on file    Number of children: Not on file    Years of education: Not on file    Highest education level: Not on file   Occupational History    Not on file  Tobacco Use    Smoking status: Every Day     Current packs/day: 1.00     Average packs/day: 1 pack/day for 10.9 years (10.9 ttl pk-yrs)     Types: Cigarettes     Start date: 2015     Passive exposure: Current    Smokeless tobacco: Not on file   Substance and Sexual Activity    Alcohol use: Yes     Alcohol/week: 20.0 - 42.0 standard drinks of alcohol     Types: 20 - 42 Standard drinks or equivalent per week    Drug use: Not Currently    Sexual activity: Not on file   Other Topics Concern    Not on file   Social History Narrative    Not on file     Social Drivers of Health     Financial Resource Strain: High Risk (11/23/2022)    Received from [facility]    Overall Financial Resource Strain (CARDIA)     Difficulty of Paying  Living Expenses: Very hard   Food Insecurity: Food Insecurity Present (02/01/2024)    Hunger Vital Sign     Worried About Running Out of Food in the Last Year: Often true     Ran Out of Food in the Last Year: Often true   Transportation Needs: Unmet Transportation Needs (02/01/2024)    PRAPARE - Therapist, Art (Medical): No     Lack of Transportation (Non-Medical): Yes   Physical Activity: Inactive (11/23/2022)    Received from [facility]    Exercise Vital Sign     On average, how many days per week do you engage in moderate to strenuous exercise (like a brisk walk)?: 0 days     On average, how many minutes do you engage in exercise at this level?: 0 min   Stress: Stress Concern Present (11/23/2022)    Received from [facility]    [facility] - Occupational Stress Questionnaire     Feeling of Stress : Very much   Social Connections: Socially Isolated (11/23/2022)    Received from [facility]    Social Connection and Isolation Panel     In a typical week, how many times do you talk on the phone with family, friends, or neighbors?: More than three times a week     How often do you get together with friends or relatives?: Three times a week     How often do you attend church or religious services?: Never     Do you belong to any clubs or organizations such as church groups, unions, fraternal or athletic groups, or school groups?: No     How often do you attend meetings of the clubs or organizations you belong to?: Never     Are you married, widowed, divorced, separated, never married, or living with a partner?: Widowed   Intimate Partner Violence: Not on file   Housing Stability: High Risk (02/01/2024)    Housing Stability Vital Sign     Unable to Pay for Housing in the Last Year: Yes     Number of Times Moved in the Last Year: 0     Homeless in the Last Year: Yes     Cognitive Functioning  (Strengths & Weaknesses):   Strengths: ability to follow simple commands  Weaknesses/Limitations: orientation, thought process/content, judgment/insight, and behavior  Employment, Hydrologist, Educational Background: High  School, Unemployed, not seeking work  Previous Interventions & Outcomes:   Prior hospitalizations: yes - 01/24/2024 [facility], October 2025 Viktoria Gibes  Financial Resources & Benefits: Training And Development Officer as of 02/01/2024       Primary Coverage       Payor Plan Insurance Group Employer/Plan Group    [employer] [place] [employer]AL [employer]        Payor Plan Address Payor Plan Phone Number Payor Plan Fax Number Effective Dates    [phone]  08/21/2022 - None Entered    [place] [id number]         Subscriber Name Subscriber Birth Date Member ID       KIYON, FIDALGO 1966/01/13 [id number]                   Health History & Current Medical Needs:  Allergies:   Allergies  Reviewed by Lyn Harlene DASEN, RN on 02/01/2024        Severity Reactions Comments    Aspirin Low Nausea And Vomiting           Recent Complaints/Conditions: yes - AFib  Nutritional Needs: no  Chronic Conditions: no  Communicable Diseases: no  Activity Restrictions: no  Special Supervision/Protocols: no  Past Illnesses/Injuries/Hospitalizations: no  Family Medical History:  No family history on file.  Substance Use (Alcohol, Prescription, Nonprescription, Illicit): yes - Alcohol and cocaine use  Psychiatric/Substance Use Issues:  Current Needs: Inpatient rehabilitation  Co-occurring Disorders: MDD  History of Use/Abuse: ETOH (freq/amount) - 200-240oz+ daily and Illicit drugs (drug/freq/amount) - cocaine when financially able  Risk Factors: Social Isolation/Single/Living alone, Adult Male over age 71, Alcohol/Drug abuse, and Unemployed/Financial stressors  History of Abuse/Neglect/Trauma: yes   [] Incarceration [x] Homelessness   [] Physical Abuse [] Sexual Abuse   [x] Emotional Abuse [] Does  not disclose/Other   [] Childhood [] Adulthood     Criminal History (Charges/Convictions/Parole/Probation): no  Daily Living Skills:  Limitations: None identified  Housing Arrangements:   The patient lives alone  Address on file:  [address] [place] [address]  Service Access & Transportation Needs: yes - needs transportation assistance  As applicable, and in all residential services :  Fall Risk: no  Communication Methods/Needs: no  Mobility & Adaptive Equipment: no    "

## 2024-02-01 NOTE — ED Notes (Signed)
 Pt resting in bed, sitter at bedside

## 2024-02-01 NOTE — H&P (Signed)
 "Department of Psychiatry  History and Physical - Adult         CHIEF COMPLAINT:  suicidal ideation    History obtained from:  patient, electronic medical record    HISTORY OF PRESENT ILLNESS:    Bradley [name] is a 58 year old male who is admitted to the [facility] after expressing suicidal ideation in the context of relationship conflict and alcohol use. He reports that a bad situation occurred between him and his girlfriend, which he took a little bit harder, leading to drinking and getting a little crazy. During this episode, he endorsed having a plan to harm himself, specifically stating he could take a 40 and break it, and cut my own wrists. He was subsequently brought to the hospital, transferred from [place] to [facility], and then to the current facility, arriving around 7:30 PM the previous evening.    The patient reports he has not been sober since his last hospitalization discharge, which occurred approximately one week prior. He acknowledges poor medication adherence, stating his medications kind of disappeared and that he took them consistently for only about 2 days after his last discharge. He is prescribed Metoprolol  and Naltrexone  but has not been taking them regularly. When asked about a time when he took medications regularly without drinking, he responded it's been a long time.    The patient expresses openness to entering a treatment program. He reports that his experience at [facility] was mixed - good in a way, but actually it was bad in other ways - due to mandatory meeting requirements that conflicted with his work schedule, ultimately leading to his discharge from the program despite having what he described as a good job.    PSYCHIATRIC HISTORY:      The patient is not currently receiving care for the above psychiatric illness.    Past mental health outpatient care includes:  1-5 treatment centers    Past mental health hospitalizations: 1-5 admissions    Past Medical History:         Diagnosis Date    Abuse, drug or alcohol (HCC)     alcohol abuse    Depression      Past Surgical History:    No past surgical history on file.  Medications Prior to Admission:   Medications Prior to Admission: traZODone  (DESYREL ) 50 MG tablet, Take 1 tablet by mouth nightly as needed for Sleep  QUEtiapine  (SEROQUEL ) 50 MG tablet, Take 1 tablet by mouth nightly  metoprolol  succinate (TOPROL  XL) 50 MG extended release tablet, Take 1 tablet by mouth daily  citalopram  (CELEXA ) 20 MG tablet, Take 1 tablet by mouth daily  divalproex  (DEPAKOTE ) 500 MG DR tablet, Take 1 tablet by mouth in the morning and at bedtime  naltrexone  (DEPADE) 50 MG tablet, Take 1 tablet by mouth daily (with breakfast) (Patient not taking: Reported on 02/01/2024)  Allergies:  Aspirin    Social History:  See History and Present Illness for Alcohol and Drug History    Family History:   No family history on file.  Psychiatric Family History  Patient is uncertain of psychiatric family history    PHYSICAL EXAM:    Vitals:  BP (!) 141/80   Pulse 80   Temp 97.5 F (36.4 C) (Oral)   Resp 16   SpO2 98%     Mental Status Examination:  Level of consciousness:  within normal limits  Appearance:  hospital attire  Behavior/Motor:  no abnormalities noted  Attitude toward examiner:  cooperative  Speech:  spontaneous, normal rate, and normal volume  Mood:  depressed  Affect:  flat  Thought processes:  linear and goal directed  Thought content:  Suicidal Ideation:  denies suicidal ideation  Cognition:  oriented to person, place, and time  Insight:  limited  Judgment:  limited    STRENGTHS: willingness to seek treatment, ability to complete ADLs  DISABILITIES: substance abuse    DIAGNOSIS:  Bipolar Disorder, MRE depressed  Alcohol Use Disorder, severe      PLAN:  Admit to [employer] further information  Medication management  Disposition planning with social work  Estimated length of stay 5-7 days  "

## 2024-02-01 NOTE — BSMART Note (Signed)
"  Bsmart progress note:  Pt accepted to [facility] by Octavio / Starla to Gen unit, room 725-01       Report [phone]     Per Karna in bed access  [place] in Ed made aware   "

## 2024-02-01 NOTE — ED Notes (Signed)
 Report given to Shanda Bumps, Charity fundraiser.

## 2024-02-02 LAB — LIPID PANEL
Chol/HDL Ratio: 2.5 (ref 0.0–5.0)
Cholesterol, Total: 172 mg/dL (ref 0–200)
HDL: 69 mg/dL — ABNORMAL HIGH (ref 40–60)
LDL Cholesterol: 70 mg/dL (ref 0–100)
Triglycerides: 165 mg/dL — ABNORMAL HIGH (ref 0–150)
VLDL Cholesterol Calculated: 33 mg/dL

## 2024-02-02 MED ORDER — CITALOPRAM HYDROBROMIDE 20 MG PO TABS
20 | ORAL_TABLET | Freq: Every day | ORAL | 0 refills | 90.00000 days | Status: AC
Start: 2024-02-02 — End: ?

## 2024-02-02 MED ORDER — QUETIAPINE FUMARATE 50 MG PO TABS
50 | ORAL_TABLET | Freq: Every evening | ORAL | 0 refills | 30.00000 days | Status: AC
Start: 2024-02-02 — End: ?

## 2024-02-02 MED ORDER — DIVALPROEX SODIUM 500 MG PO TBEC
500 | ORAL_TABLET | Freq: Two times a day (BID) | ORAL | 0 refills | 30.00000 days | Status: AC
Start: 2024-02-02 — End: ?

## 2024-02-02 MED ORDER — HYDROXYZINE HCL 50 MG PO TABS
50 | ORAL_TABLET | Freq: Three times a day (TID) | ORAL | 0 refills | 30.00000 days | Status: AC | PRN
Start: 2024-02-02 — End: 2024-02-12

## 2024-02-02 MED ORDER — METOPROLOL SUCCINATE ER 50 MG PO TB24
50 | ORAL_TABLET | Freq: Every day | ORAL | 0 refills | 90.00000 days | Status: AC
Start: 2024-02-02 — End: ?

## 2024-02-02 MED ORDER — TRAZODONE HCL 50 MG PO TABS
50 | ORAL_TABLET | Freq: Every evening | ORAL | 0 refills | 30.00000 days | Status: AC | PRN
Start: 2024-02-02 — End: ?

## 2024-02-02 MED FILL — PHENOBARBITAL 32.4 MG PO TABS: 32.4 mg | ORAL | Qty: 2 | Fill #0

## 2024-02-02 MED FILL — PHENOBARBITAL 32.4 MG PO TABS: 32.4 mg | ORAL | Qty: 1 | Fill #0

## 2024-02-02 MED FILL — DIVALPROEX SODIUM 500 MG PO TBEC: 500 mg | ORAL | Qty: 1 | Fill #0

## 2024-02-02 MED FILL — CITALOPRAM HYDROBROMIDE 20 MG PO TABS: 20 mg | ORAL | Qty: 1 | Fill #0

## 2024-02-02 MED FILL — THERA PO TABS: ORAL | Qty: 1 | Fill #0

## 2024-02-02 MED FILL — FOLIC ACID 1 MG PO TABS: 1 mg | ORAL | Qty: 1 | Fill #0

## 2024-02-02 MED FILL — METOPROLOL SUCCINATE ER 25 MG PO TB24: 25 mg | ORAL | Qty: 2 | Fill #0

## 2024-02-02 MED FILL — QUETIAPINE FUMARATE 25 MG PO TABS: 25 mg | ORAL | Qty: 2 | Fill #0

## 2024-02-02 MED FILL — THIAMINE HCL 100 MG PO TABS: 100 mg | ORAL | Qty: 1 | Fill #0

## 2024-02-02 NOTE — Interdisciplinary Rounds (Addendum)
"    Behavioral Health Interdisciplinary Rounds     Patient Name: Bradley [name]  Age: 58 y.o.  Room/Bed:  725/01  Primary Diagnosis: Mood disorder   Admission Status: Voluntary    Readmission within 30 days: No  Power of Attorney in place: No  Patient requires a blocked bed: No          Reason for blocked bed: N/A  Sleep hours:        Participation in Care/Groups:  No  Medication Compliant?: Yes  PRNS (last 24 hours): None   Restraints (last 24 hours):  No  __________________________________________________  OQ Admission Analysis Survey completed:  OQ Admission Analysis Survey score:  __________________________________________________     Alcohol screening (AUDIT) completed -     If applicable, date SBIRT discussed in treatment team AND documented:    Tobacco - patient is a smoker:    Illegal Drugs use:      24 hour chart check complete: Yes    _______________________________________________    Patient goal(s) for today:   Treatment team focus/goals:   Progress note: Patient met with treatment team and appeared with a fair mood and affect. He reports fair sleep overnight until he was woken up to draw blood for labs. He denies SI/HI and [facility] at this time. He is still interested in pursuing [facility]. SW to send referrals.    1230: Patient insurance not accepted by [facility], SW discussed with patient. Discussed alternative programs ([facility], [facility]), he is interested in [facility].    1245: SW faxed clinicals to [facility] in [facility]. Admissions team to review.    1355: Patient accepted to [facility] for 02/06/24, transportation will pick up at 11AM    LOS:  1  Expected LOS: Monday    Participating treatment team members: Michelina JULIANNA Burdock, Therisa Bihari, MSW, Harlene CROME, RN, Pleasant Mangle, NP, Royce Mainland PharmD       "

## 2024-02-02 NOTE — Plan of Care (Signed)
"    Problem: Depression  Goal: Will be euthymic at discharge  Description: INTERVENTIONS:  1. Administer medication as ordered  2. Provide emotional support via 1:1 interaction with staff  3. Encourage involvement in milieu/groups/activities  4. Monitor for social isolation  Outcome: Progressing     Problem: Safety - Adult  Goal: Free from fall injury  Outcome: Progressing     Pt is visible out on unit. Respirations are even and unlabored. Pt has no visible/voiced signs of distress or discomfort. CIWAs are unremarkable. Affect is bright. Walkways are free and clear from clutter. Gait is steady. Plan of care is ongoing.  "

## 2024-02-02 NOTE — BH Note (Signed)
"  Passive participation in Psychoeducation group.  SW provided handouts to complete independently  "

## 2024-02-02 NOTE — BH Note (Signed)
 "       Psychiatric Progress Note    Patient: Bradley [name] MRN: [id number]  SSN: [id number]    Date of Birth: 1965/04/20  Age: 58 y.o.  Sex: male      Admit Date: 02/01/2024    LOS: 1 day     Subjective:     KMARI HALTER expresses motivation for sobriety and openness to engaging in programming upon discharge. They have previous experience with residential treatment programs, specifically mentioning [facility] in [place], which involved half-day programming (either 9 AM to 12 PM or 12 PM to 5 PM) combined with work, and residential living with other program participants. The patient also has experience with [employer] [facility] and attended AA/NA meetings as part of their previous treatment. They describe their previous program experience as not a bad program but acknowledge that it did not maintain their sobriety long-term, stating that won't keep me and mentioning they learned my lesson about returning to previous patterns.    Patient is currently being considered for placement at [facility], a 30-45 day residential program that typically transitions patients to intensive outpatient programming in a residential setting for an additional 3-4 months. They are awaiting contact from the [facility] program to discuss details and next steps in their treatment planning. He denies SI/HI/AH/VH.    Objective:     Vitals:    02/01/24 1147 02/01/24 1700 02/01/24 2134 02/02/24 0807   BP: (!) 149/80 130/82 139/71 (!) 147/75   Pulse: 74 73 61 72   Resp: 16 16 16 16    Temp: 98.2 F (36.8 C) 98.1 F (36.7 C) 97.9 F (36.6 C)    TempSrc: Oral Oral Oral Oral   SpO2: 97%  97% 98%   Weight:    74.8 kg (165 lb)   Height:    1.702 m (5' 7)        Mental Status Exam:   Sensorium  oriented to time, place and person   Relations cooperative   Eye Contact appropriate   Appearance:  disheveled   Speech:  non-pressured   Thought Process: within normal limits   Thought Content no hallucinations and no delusions    Suicidal ideations none   Mood:  within normal limits   Affect:  flat   Memory   adequate   Concentration:  adequate   Insight:  fair   Judgment:  fair       MEDICATIONS:  Current Facility-Administered Medications   Medication Dose Route Frequency    acetaminophen  (TYLENOL ) tablet 650 mg  650 mg Oral Q4H PRN    polyethylene glycol (GLYCOLAX ) packet 17 g  17 g Oral Daily PRN    senna (SENOKOT) tablet 8.6 mg  1 tablet Oral Daily PRN    aluminum & magnesium  hydroxide-simethicone (MAALOX PLUS) 200-200-20 MG/5ML suspension 30 mL  30 mL Oral Q6H PRN    hydrOXYzine  HCl (ATARAX ) tablet 50 mg  50 mg Oral TID PRN    haloperidol  (HALDOL ) tablet 5 mg  5 mg Oral Q4H PRN    Or    haloperidol  lactate (HALDOL ) injection 5 mg  5 mg IntraMUSCular Q4H PRN    diphenhydrAMINE  (BENADRYL ) injection 50 mg  50 mg IntraMUSCular Q4H PRN    traZODone  (DESYREL ) tablet 50 mg  50 mg Oral Nightly PRN    citalopram  (CELEXA ) tablet 20 mg  20 mg Oral Daily    divalproex  (DEPAKOTE ) DR tablet 500 mg  500 mg Oral BID  metoprolol  succinate (TOPROL  XL) extended release tablet 50 mg  50 mg Oral Daily    QUEtiapine  (SEROQUEL ) tablet 50 mg  50 mg Oral Nightly    PHENobarbital  tablet 32.4 mg  32.4 mg Oral 4x daily    Followed by    NOREEN ON 02/03/2024] PHENobarbital  tablet 32.4 mg  32.4 mg Oral BID    Followed by    NOREEN ON 02/04/2024] PHENobarbital  tablet 16.2 mg  16.2 mg Oral BID    PHENobarbital  tablet 32.4 mg  32.4 mg Oral Q6H PRN    Followed by    NOREEN ON 02/04/2024] PHENobarbital  tablet 16.2 mg  16.2 mg Oral Q6H PRN    thiamine  tablet 100 mg  100 mg Oral Daily    folic acid  (FOLVITE ) tablet 1 mg  1 mg Oral Daily    multivitamin 1 tablet  1 tablet Oral Daily      DISCUSSION:   the risks and benefits of the proposed medication    Lab/Data Review:  All lab results for the last 24 hours reviewed.    Recent Results (from the past 24 hours)   EKG 12 lead    Collection Time: 02/01/24  1:27 PM   Result Value Ref Range    Ventricular Rate 69 BPM     Atrial Rate 69 BPM    P-R Interval 150 ms    QRS Duration 88 ms    Q-T Interval 384 ms    QTc Calculation (Bazett) 411 ms    P Axis 56 degrees    R Axis 72 degrees    T Axis 61 degrees    Diagnosis       Normal sinus rhythm  Nonspecific ST abnormality    When compared with ECG of 31-Jan-2024 19:59,  No significant change was found  Confirmed by Alena, M.D., Jayson [phone]) on 02/01/2024 2:22:38 PM     Lipid Panel    Collection Time: 02/02/24  7:08 AM   Result Value Ref Range    Cholesterol, Total 172 0 - 200 MG/DL    Triglycerides 834 (H) 0 - 150 MG/DL    HDL 69 (H) 40 - 60 MG/DL    LDL Cholesterol 70 0 - 100 MG/DL    VLDL Cholesterol Calculated 33 MG/DL    Chol/HDL Ratio 2.5 0.0 - 5.0           Assessment:     Principal Problem:    Mood disorder  Resolved Problems:    * No resolved hospital problems. *      Plan:     Continue current care  Collateral information  Continue current medications  Disposition planning with social work    I certify that this patient's inpatient psychiatric hospital services furnished since the previous certification were, and continue to be, required for treatment that could reasonably be expected to improve the patient's condition, or for diagnostic study, and that the patient continues to need, on a daily basis, active treatment furnished directly by or requiring the supervision of inpatient psychiatric facility personnel. In addition, the hospital records show that services furnished were intensive treatment services, admission or related services, or equivalent services.  Signed By: Pleasant DELENA Mangle, APRN - NP     February 02, 2024      "

## 2024-02-02 NOTE — Plan of Care (Signed)
"    Problem: Drug Abuse/Detox  Goal: Will have no detox symptoms and will verbalize plan for changing drug-related behavior  Description: INTERVENTIONS:  1. Administer medication as ordered  2. Monitor physical status  3. Provide emotional support with 1:1 interaction with staff  4. Encourage  recovery focused treatment   Outcome: Progressing  Note: CIWA unremarkable less than 6 since admission. Reports motivation to work on sobriety plan. Open to recommendations of tx team. Denies SI, no self harming behaviors. Daily goal is to talk with IOP agency reps on phone for eval required for placement. Staff focus is on offering support and encourage progress towards goals      "

## 2024-02-03 MED FILL — PHENOBARBITAL 32.4 MG PO TABS: 32.4 mg | ORAL | Qty: 1 | Fill #0

## 2024-02-03 MED FILL — QUETIAPINE FUMARATE 25 MG PO TABS: 25 mg | ORAL | Qty: 2 | Fill #0

## 2024-02-03 MED FILL — DIVALPROEX SODIUM 500 MG PO TBEC: 500 mg | ORAL | Qty: 1 | Fill #0

## 2024-02-03 MED FILL — METOPROLOL SUCCINATE ER 25 MG PO TB24: 25 mg | ORAL | Qty: 2 | Fill #0

## 2024-02-03 MED FILL — THIAMINE HCL 100 MG PO TABS: 100 mg | ORAL | Qty: 1 | Fill #0

## 2024-02-03 MED FILL — TRAZODONE HCL 50 MG PO TABS: 50 mg | ORAL | Qty: 1 | Fill #0

## 2024-02-03 MED FILL — THERA PO TABS: ORAL | Qty: 1 | Fill #0

## 2024-02-03 MED FILL — CITALOPRAM HYDROBROMIDE 20 MG PO TABS: 20 mg | ORAL | Qty: 1 | Fill #0

## 2024-02-03 MED FILL — FOLIC ACID 1 MG PO TABS: 1 mg | ORAL | Qty: 1 | Fill #0

## 2024-02-03 NOTE — BH Note (Signed)
 "       Psychiatric Progress Note    Patient: Bradley [name] MRN: [id number]  SSN: [id number]    Date of Birth: 02-25-1966  Age: 58 y.o.  Sex: male      Admit Date: 02/01/2024    LOS: 2 days     Subjective:   02/03/24   Patient reports being scheduled to start the [facility] on Monday, expressing mixed feelings about this upcoming change, describing being kind of excited about doing something different this time and feeling more curious than excited, while acknowledging that doing something different is scary. Patient hopes this program will provide something to do after leaving and prevent homelessness, stating that if homeless.    Patient denies current suicidal thoughts and endorses thoughts about hurting others. Patient is uncertain about the origin of these thoughts, wondering if they may have stemmed from a bad dream. In response to these thoughts, patient has been staying in their room, chilling and reading, just trying to forget everything.    02/02/24  Michelina JULIANNA Burdock expresses motivation for sobriety and openness to engaging in programming upon discharge. They have previous experience with residential treatment programs, specifically mentioning [facility] in [place], which involved half-day programming (either 9 AM to 12 PM or 12 PM to 5 PM) combined with work, and residential living with other program participants. The patient also has experience with [employer] and attended AA/NA meetings as part of their previous treatment. They describe their previous program experience as not a bad program but acknowledge that it did not maintain their sobriety long-term, stating that won't keep me and mentioning they learned my lesson about returning to previous patterns.    Patient is currently being considered for placement at [facility], a 30-45 day residential program that typically transitions patients to intensive outpatient programming in a residential setting for an  additional 3-4 months. They are awaiting contact from the [facility] program to discuss details and next steps in their treatment planning. He denies SI/HI/AH/VH.    Objective:     Vitals:    02/02/24 1559 02/02/24 1954 02/03/24 0736 02/03/24 1135   BP: 119/72 112/83 119/76 114/75   Pulse: 72 68 63 66   Resp: 16  16 14    Temp:  97.5 F (36.4 C) 97.2 F (36.2 C) 97.9 F (36.6 C)   TempSrc:   Oral    SpO2: 98% 99% 100% 98%   Weight:       Height:            Mental Status Exam:   Sensorium  oriented to time, place and person   Relations cooperative   Eye Contact appropriate   Appearance:  disheveled   Speech:  non-pressured   Thought Process: within normal limits   Thought Content no hallucinations and no delusions   Suicidal ideations none   Mood:  within normal limits   Affect:  flat   Memory   adequate   Concentration:  adequate   Insight:  fair   Judgment:  fair       MEDICATIONS:  Current Facility-Administered Medications   Medication Dose Route Frequency    acetaminophen  (TYLENOL ) tablet 650 mg  650 mg Oral Q4H PRN    polyethylene glycol (GLYCOLAX ) packet 17 g  17 g Oral Daily PRN    senna (SENOKOT) tablet 8.6 mg  1 tablet Oral Daily PRN    aluminum & magnesium  hydroxide-simethicone (MAALOX PLUS) 200-200-20 MG/5ML suspension 30 mL  30 mL Oral Q6H PRN    hydrOXYzine  HCl (ATARAX ) tablet 50 mg  50 mg Oral TID PRN    haloperidol  (HALDOL ) tablet 5 mg  5 mg Oral Q4H PRN    Or    haloperidol  lactate (HALDOL ) injection 5 mg  5 mg IntraMUSCular Q4H PRN    diphenhydrAMINE  (BENADRYL ) injection 50 mg  50 mg IntraMUSCular Q4H PRN    traZODone  (DESYREL ) tablet 50 mg  50 mg Oral Nightly PRN    citalopram  (CELEXA ) tablet 20 mg  20 mg Oral Daily    divalproex  (DEPAKOTE ) DR tablet 500 mg  500 mg Oral BID    metoprolol  succinate (TOPROL  XL) extended release tablet 50 mg  50 mg Oral Daily    QUEtiapine  (SEROQUEL ) tablet 50 mg  50 mg Oral Nightly    PHENobarbital  tablet 32.4 mg  32.4 mg Oral BID    Followed by    NOREEN ON  02/04/2024] PHENobarbital  tablet 16.2 mg  16.2 mg Oral BID    PHENobarbital  tablet 32.4 mg  32.4 mg Oral Q6H PRN    Followed by    NOREEN ON 02/04/2024] PHENobarbital  tablet 16.2 mg  16.2 mg Oral Q6H PRN    thiamine  tablet 100 mg  100 mg Oral Daily    folic acid  (FOLVITE ) tablet 1 mg  1 mg Oral Daily    multivitamin 1 tablet  1 tablet Oral Daily      DISCUSSION:   the risks and benefits of the proposed medication    Lab/Data Review:  All lab results for the last 24 hours reviewed.    No results found for this or any previous visit (from the past 24 hours).        Assessment:     Principal Problem:    Mood disorder  Resolved Problems:    * No resolved hospital problems. *      Plan:     Continue current care  Collateral information  Continue current medications  Disposition planning with social work    I certify that this patient's inpatient psychiatric hospital services furnished since the previous certification were, and continue to be, required for treatment that could reasonably be expected to improve the patient's condition, or for diagnostic study, and that the patient continues to need, on a daily basis, active treatment furnished directly by or requiring the supervision of inpatient psychiatric facility personnel. In addition, the hospital records show that services furnished were intensive treatment services, admission or related services, or equivalent services.  Signed By: Pleasant DELENA Mangle, APRN - NP     February 03, 2024      "

## 2024-02-03 NOTE — Plan of Care (Signed)
 Problem: Safety - Adult  Goal: Free from fall injury  Outcome: Progressing  Note: Pt is resting in bed with eyes closed, appears to be sleeping. Respirations are even and unlabored, NAD. Safety measures are in place and Q 15 minute rounds are done.

## 2024-02-03 NOTE — Interdisciplinary Rounds (Signed)
"  Behavioral Health Interdisciplinary Rounds     Patient Name: Bradley [name]  Age: 58 y.o.  Room/Bed:  725/01  Primary Diagnosis: Mood disorder  Admission Status: Voluntary  Readmission within 30 days: No  Power of Attorney in place: No  Patient requires a blocked bed: No          Reason for blocked bed: N/A  Sleep hours: 8+  Participation in Care/Groups: Yes  Medication Compliant?: Yes  PRNS (last 24 hours): Sleep Aid  Restraints (last 24 hours): No  __________________________________________________  OQ Admission Analysis Survey completed:   OQ Admission Analysis Survey score:     __________________________________________________     Alcohol screening (AUDIT) completed -     Score:   Tobacco :   Illegal Drugs use:   CSSR Lifetime:     24 hour chart check complete: Yes    _______________________________________________    Patient goal(s) for today:   Treatment team focus/goals:   Progress note:     Spiritual Care Consult:   Financial concerns/prescription coverage:   Family contact:   Family requesting physician contact today:   Discharge plan:   Access to weapons:   Outpatient provider(s):     LOS:  2  Expected LOS:     Participating treatment team members: Bradley [name], * (assigned SW),    "

## 2024-02-03 NOTE — Interdisciplinary Rounds (Signed)
"  Behavioral Health Interdisciplinary Rounds     Patient Name: Bradley [name]  Age: 58 y.o.  Room/Bed:  725/01  Primary Diagnosis: Mood disorder  Admission Status: Voluntary  Readmission within 30 days: No  Power of Attorney in place: No  Patient requires a blocked bed: No          Reason for blocked bed: N/A  Sleep hours: 8+  Participation in Care/Groups: Yes  Medication Compliant?: Yes  PRNS (last 24 hours): Sleep Aid  Restraints (last 24 hours): No  __________________________________________________  OQ Admission Analysis Survey completed:   OQ Admission Analysis Survey score:     __________________________________________________     Alcohol screening (AUDIT) completed -     Score:   Tobacco :   Illegal Drugs use:   CSSR Lifetime:     24 hour chart check complete: Yes    _______________________________________________    Patient goal(s) for today:   Treatment team focus/goals:   Progress note: Patient denies SI/HI and [facility]. Discharge Monday 11AM to [facility] in [facility].    LOS:  2  Expected LOS: Monday    Participating treatment team members: Michelina JULIANNA Burdock Therisa Joretta, MSW, Harlene CROME, RN, Pleasant Mangle, NP, Royce Mainland pharmD  "

## 2024-02-03 NOTE — Plan of Care (Signed)
"    Problem: Drug Abuse/Detox  Goal: Will have no detox symptoms and will verbalize plan for changing drug-related behavior  Description: INTERVENTIONS:  1. Administer medication as ordered  2. Monitor physical status  3. Provide emotional support with 1:1 interaction with staff  4. Encourage  recovery focused treatment   Outcome: Progressing  Note: Accepted to [facility] with plans to dc on Monday. Reports motivation remains high and ready to engage in his sobriety. Daily goal is to read. Staff focus is on offering support     "

## 2024-02-04 MED FILL — FOLIC ACID 1 MG PO TABS: 1 mg | ORAL | Qty: 1 | Fill #0

## 2024-02-04 MED FILL — THIAMINE HCL 100 MG PO TABS: 100 mg | ORAL | Qty: 1 | Fill #0

## 2024-02-04 MED FILL — PHENOBARBITAL 32.4 MG PO TABS: 32.4 mg | ORAL | Qty: 1 | Fill #0

## 2024-02-04 MED FILL — DIVALPROEX SODIUM 500 MG PO TBEC: 500 mg | ORAL | Qty: 1 | Fill #0

## 2024-02-04 MED FILL — METOPROLOL SUCCINATE ER 25 MG PO TB24: 25 mg | ORAL | Qty: 2 | Fill #0

## 2024-02-04 MED FILL — CITALOPRAM HYDROBROMIDE 20 MG PO TABS: 20 mg | ORAL | Qty: 1 | Fill #0

## 2024-02-04 MED FILL — THERA PO TABS: ORAL | Qty: 1 | Fill #0

## 2024-02-04 MED FILL — QUETIAPINE FUMARATE 25 MG PO TABS: 25 mg | ORAL | Qty: 2 | Fill #0

## 2024-02-04 NOTE — BH Note (Signed)
 "       Psychiatric Progress Note    Patient: Bradley [name] MRN: [id number]  SSN: [id number]    Date of Birth: 08/23/1965  Age: 58 y.o.  Sex: male      Admit Date: 02/01/2024    LOS: 3 days     Subjective:   02/04/24-seen for follow-up.  Presented with restricted affect.  Reported he is doing a little bit better, he slept better last night and he feels medication for sleep is helping.  States that he always stay depressed and his depression is still high but better than before, rated his depression as 7 out of 10.  Reported his anxiety is moderate.  He has been isolative to himself mostly.  No suicidal thoughts today.  Denies HI, AH, VH.  Compliant with medications, no side effects reported.  No behavioral issues.    02/03/24   Patient reports being scheduled to start [facility] on Monday, expressing mixed feelings about this upcoming change, describing being kind of excited about doing something different this time and feeling more curious than excited, while acknowledging that doing something different is scary. Patient hopes this program will provide something to do after leaving and prevent homelessness, stating that if homeless.    Patient denies current suicidal thoughts and endorses thoughts about hurting others. Patient is uncertain about the origin of these thoughts, wondering if they may have stemmed from a bad dream. In response to these thoughts, patient has been staying in their room, chilling and reading, just trying to forget everything.    02/02/24  Bradley [name] expresses motivation for sobriety and openness to engaging in programming upon discharge. They have previous experience with residential treatment programs, specifically mentioning [facility] in [place], which involved half-day programming (either 9 AM to 12 PM or 12 PM to 5 PM) combined with work, and residential living with other program participants. The patient also has experience with [employer] and attended  AA/NA meetings as part of their previous treatment. They describe their previous program experience as not a bad program but acknowledge that it did not maintain their sobriety long-term, stating that won't keep me and mentioning they learned my lesson about returning to previous patterns.    Patient is currently being considered for placement at [facility], a 30-45 day residential program that typically transitions patients to intensive outpatient programming in a residential setting for an additional 3-4 months. They are awaiting contact from the [facility] program to discuss details and next steps in their treatment planning. He denies SI/HI/AH/VH.    Objective:     Vitals:    02/03/24 1948 02/04/24 0734 02/04/24 1205 02/04/24 1534   BP: 133/74 109/71 137/76 132/66   Pulse: 70 69 69 67   Resp: 16 18 18 18    Temp: 97.2 F (36.2 C) 97.9 F (36.6 C) 97.9 F (36.6 C) 97.7 F (36.5 C)   TempSrc: Oral Oral Oral Oral   SpO2: 99% 99% 98% 98%   Weight:       Height:            Mental Status Exam:   Sensorium  oriented to time, place and person   Relations cooperative   Eye Contact appropriate   Appearance:  disheveled   Speech:  non-pressured   Thought Process: within normal limits   Thought Content no hallucinations and no delusions   Suicidal ideations none   Mood:  within normal limits   Affect:  flat   Memory   adequate   Concentration:  adequate   Insight:  fair   Judgment:  fair       MEDICATIONS:  Current Facility-Administered Medications   Medication Dose Route Frequency    acetaminophen  (TYLENOL ) tablet 650 mg  650 mg Oral Q4H PRN    polyethylene glycol (GLYCOLAX ) packet 17 g  17 g Oral Daily PRN    senna (SENOKOT) tablet 8.6 mg  1 tablet Oral Daily PRN    aluminum & magnesium  hydroxide-simethicone (MAALOX PLUS) 200-200-20 MG/5ML suspension 30 mL  30 mL Oral Q6H PRN    hydrOXYzine  HCl (ATARAX ) tablet 50 mg  50 mg Oral TID PRN    haloperidol  (HALDOL ) tablet 5 mg  5 mg Oral Q4H PRN    Or     haloperidol  lactate (HALDOL ) injection 5 mg  5 mg IntraMUSCular Q4H PRN    diphenhydrAMINE  (BENADRYL ) injection 50 mg  50 mg IntraMUSCular Q4H PRN    traZODone  (DESYREL ) tablet 50 mg  50 mg Oral Nightly PRN    citalopram  (CELEXA ) tablet 20 mg  20 mg Oral Daily    divalproex  (DEPAKOTE ) DR tablet 500 mg  500 mg Oral BID    metoprolol  succinate (TOPROL  XL) extended release tablet 50 mg  50 mg Oral Daily    QUEtiapine  (SEROQUEL ) tablet 50 mg  50 mg Oral Nightly    PHENobarbital  tablet 16.2 mg  16.2 mg Oral BID    PHENobarbital  tablet 16.2 mg  16.2 mg Oral Q6H PRN    thiamine  tablet 100 mg  100 mg Oral Daily    folic acid  (FOLVITE ) tablet 1 mg  1 mg Oral Daily    multivitamin 1 tablet  1 tablet Oral Daily      DISCUSSION:   the risks and benefits of the proposed medication    Lab/Data Review:  All lab results for the last 24 hours reviewed.    No results found for this or any previous visit (from the past 24 hours).        Assessment:     Principal Problem:    Mood disorder  Resolved Problems:    * No resolved hospital problems. *      Plan:     Continue current care  Collateral information  Continue current medications  Disposition planning with social work    I certify that this patient's inpatient psychiatric hospital services furnished since the previous certification were, and continue to be, required for treatment that could reasonably be expected to improve the patient's condition, or for diagnostic study, and that the patient continues to need, on a daily basis, active treatment furnished directly by or requiring the supervision of inpatient psychiatric facility personnel. In addition, the hospital records show that services furnished were intensive treatment services, admission or related services, or equivalent services.  Signed By: Ezzard ONEIDA Hun, ACNP     February 04, 2024      "

## 2024-02-04 NOTE — Plan of Care (Signed)
"    Problem: Depression  Goal: Will be euthymic at discharge  Description: INTERVENTIONS:  1. Administer medication as ordered  2. Provide emotional support via 1:1 interaction with staff  3. Encourage involvement in milieu/groups/activities  4. Monitor for social isolation  Outcome: Progressing  Note: Pt out on the unit for small periods of time. Anxious regarding discharge on Monday. Cooperative with staff. Denies SI/HI at this time.     "

## 2024-02-04 NOTE — Plan of Care (Signed)
"    Problem: Chronic Conditions and Co-morbidities  Goal: Patient's chronic conditions and co-morbidity symptoms are monitored and maintained or improved  Outcome: Progressing     Problem: Drug Abuse/Detox  Goal: Will have no detox symptoms and will verbalize plan for changing drug-related behavior  Description: INTERVENTIONS:  1. Administer medication as ordered  2. Monitor physical status  3. Provide emotional support with 1:1 interaction with staff  4. Encourage  recovery focused treatment   Outcome: Progressing     Problem: Depression  Goal: Will be euthymic at discharge  Description: INTERVENTIONS:  1. Administer medication as ordered  2. Provide emotional support via 1:1 interaction with staff  3. Encourage involvement in milieu/groups/activities  4. Monitor for social isolation  02/04/2024 2157 by Candyce Beau, RN  Outcome: Progressing     Problem: Discharge Planning  Goal: Discharge to home or other facility with appropriate resources  Outcome: Progressing     "

## 2024-02-04 NOTE — Interdisciplinary Rounds (Signed)
"  Behavioral Health Interdisciplinary Rounds     Patient Name: Bradley [name]  Age: 58 y.o.  Room/Bed:  725/01  Primary Diagnosis: Mood disorder   Admission Status: Voluntary     Readmission within 30 days:   Power of Attorney in place:   Patient requires a blocked bed: No          Reason for blocked bed: N/A  Sleep hours:   Flu vaccine : not received this season      Participation in Care/Groups:  Yes  Medication Compliant?: BH Medication Compliant: Yes  PRNS (last 24 hours): None    Restraints (last 24 hours):  NO  __________________________________________________  OQ Admission Analysis Survey completed:  OQ Admission Analysis Survey score:    __________________________________________________     Alcohol screening (AUDIT) completed -     Score:   Tobacco :  Illegal Drugs use:   CSSR Lifetime:     24 hour chart check complete: Yes    _______________________________________________    Patient goal(s) for today:   Treatment team focus/goals:   Progress note:      Spiritual Care Consult:   Financial concerns/prescription coverage:    Family contact:                        Family requesting physician contact today:    Discharge plan:   Access to weapons :                                                              Outpatient provider(s):     LOS:  3  Expected LOS:     Participating treatment team members: Michelina JULIANNA Burdock, * (assigned SW),    "

## 2024-02-05 LAB — VALPROIC ACID LEVEL, TOTAL: Valproic Acid: 29 ug/mL — ABNORMAL LOW (ref 50–100)

## 2024-02-05 MED FILL — FOLIC ACID 1 MG PO TABS: 1 mg | ORAL | Qty: 1 | Fill #0

## 2024-02-05 MED FILL — THIAMINE HCL 100 MG PO TABS: 100 mg | ORAL | Qty: 1 | Fill #0

## 2024-02-05 MED FILL — THERA PO TABS: ORAL | Qty: 1 | Fill #0

## 2024-02-05 MED FILL — METOPROLOL SUCCINATE ER 25 MG PO TB24: 25 mg | ORAL | Qty: 2 | Fill #0

## 2024-02-05 MED FILL — HYDROXYZINE HCL 50 MG PO TABS: 50 mg | ORAL | Qty: 1 | Fill #0

## 2024-02-05 MED FILL — PHENOBARBITAL 32.4 MG PO TABS: 32.4 mg | ORAL | Qty: 1 | Fill #0

## 2024-02-05 MED FILL — DIVALPROEX SODIUM 500 MG PO TBEC: 500 mg | ORAL | Qty: 1 | Fill #0

## 2024-02-05 MED FILL — QUETIAPINE FUMARATE 25 MG PO TABS: 25 mg | ORAL | Qty: 2 | Fill #0

## 2024-02-05 MED FILL — TRAZODONE HCL 50 MG PO TABS: 50 mg | ORAL | Qty: 1 | Fill #0

## 2024-02-05 MED FILL — CITALOPRAM HYDROBROMIDE 20 MG PO TABS: 20 mg | ORAL | Qty: 1 | Fill #0

## 2024-02-05 NOTE — Plan of Care (Addendum)
"    Problem: Discharge Planning  Goal: Discharge to home or other facility with appropriate resources  Outcome: Progressing  Flowsheets (Taken 02/05/2024 1429)  Discharge to home or other facility with appropriate resources: Identify barriers to discharge with patient and caregiver     Problem: Depression  Goal: Will be euthymic at discharge  Description: INTERVENTIONS:  1. Administer medication as ordered  2. Provide emotional support via 1:1 interaction with staff  3. Encourage involvement in milieu/groups/activities  4. Monitor for social isolation  Outcome: Progressing    Pt out on unit, visible. Mood brightened, calm and cooperative. Medication and meal compliant. No complaints at this time.     "

## 2024-02-05 NOTE — Interdisciplinary Rounds (Signed)
"  Behavioral Health Interdisciplinary Rounds     Patient Name: Bradley [name]  Age: 58 y.o.  Room/Bed:  725/01  Primary Diagnosis: Mood disorder  Admission Status: Voluntary  Readmission within 30 days: No  Power of Attorney in place: No  Patient requires a blocked bed: No          Reason for blocked bed: N/A  Sleep hours: 8+  Participation in Care/Groups: Yes  Medication Compliant?: Yes  PRNS (last 24 hours): Antianxiety and Sleep Aid  Restraints (last 24 hours): No  __________________________________________________  OQ Admission Analysis Survey completed:   OQ Admission Analysis Survey score:     __________________________________________________     Alcohol screening (AUDIT) completed -     Score:   Tobacco :   Illegal Drugs use:   CSSR Lifetime:     24 hour chart check complete: Yes    _______________________________________________    Patient goal(s) for today:   Treatment team focus/goals:   Progress note:     Spiritual Care Consult:   Financial concerns/prescription coverage:   Family contact:   Family requesting physician contact today:   Discharge plan:   Access to weapons:   Outpatient provider(s):     LOS:  4  Expected LOS:     Participating treatment team members: Michelina JULIANNA Burdock, * (assigned SW),    "

## 2024-02-05 NOTE — Plan of Care (Signed)
"    Problem: Drug Abuse/Detox  Goal: Will have no detox symptoms and will verbalize plan for changing drug-related behavior  Description: INTERVENTIONS:  1. Administer medication as ordered  2. Monitor physical status  3. Provide emotional support with 1:1 interaction with staff  4. Encourage  recovery focused treatment   02/05/2024 1100 by Lyn Harlene DASEN, RN  Outcome: Progressing  Note: Out on unit engaged, social and active. Bright affect, hopeful mood, thoughts organized, logical and goal directed. Daily goal is to read. Staff focus is on offering support  "

## 2024-02-05 NOTE — BH Note (Signed)
 "       Psychiatric Progress Note    Patient: Bradley [name] MRN: [id number]  SSN: [id number]    Date of Birth: June 02, 1965  Age: 58 y.o.  Sex: male      Admit Date: 02/01/2024    LOS: 4 days     Subjective:   02/05/24  Patient seen with the treatment team.  Presented with brighter affect.  Reported he is in a better mood today.  Slept good last night.  His depression is a lot better.  Denies SI or HI.  Having some anxiety about his discharge.  Reported having some craving for alcohol but he is going to rehab.  Denies hallucinations or paranoia.  Has been more active in the milieu.  Tolerating medications, no side effects reported.    02/04/24-seen for follow-up.  Presented with restricted affect.  Reported he is doing a little bit better, he slept better last night and he feels medication for sleep is helping.  States that he always stay depressed and his depression is still high but better than before, rated his depression as 7 out of 10.  Reported his anxiety is moderate.  He has been isolative to himself mostly.  No suicidal thoughts today.  Denies HI, AH, VH.  Compliant with medications, no side effects reported.  No behavioral issues.    02/03/24   Patient reports being scheduled to start the [facility] on Monday, expressing mixed feelings about this upcoming change, describing being kind of excited about doing something different this time and feeling more curious than excited, while acknowledging that doing something different is scary. Patient hopes this program will provide something to do after leaving and prevent homelessness, stating that if homeless.    Patient denies current suicidal thoughts and endorses thoughts about hurting others. Patient is uncertain about the origin of these thoughts, wondering if they may have stemmed from a bad dream. In response to these thoughts, patient has been staying in their room, chilling and reading, just trying to forget everything.    02/02/24  Bradley [name] expresses motivation for sobriety and openness to engaging in programming upon discharge. They have previous experience with residential treatment programs, specifically mentioning [facility] in [place], which involved half-day programming (either 9 AM to 12 PM or 12 PM to 5 PM) combined with work, and residential living with other program participants. The patient also has experience with [employer] and attended AA/NA meetings as part of their previous treatment. They describe their previous program experience as not a bad program but acknowledge that it did not maintain their sobriety long-term, stating that won't keep me and mentioning they learned my lesson about returning to previous patterns.    Patient is currently being considered for placement at [facility], a 30-45 day residential program that typically transitions patients to intensive outpatient programming in a residential setting for an additional 3-4 months. They are awaiting contact from the [facility] program to discuss details and next steps in their treatment planning. He denies SI/HI/AH/VH.    Objective:     Vitals:    02/04/24 1534 02/05/24 0747 02/05/24 0847 02/05/24 0944   BP: 132/66 131/68  131/68   Pulse: 67 66  66   Resp: 18 16     Temp: 97.7 F (36.5 C) 98.1 F (36.7 C)     TempSrc: Oral Oral     SpO2: 98% 98%     Weight:   78.5 kg (173  lb)    Height:            Mental Status Exam:   Sensorium  oriented to time, place and person   Relations cooperative   Eye Contact appropriate   Appearance:  disheveled   Speech:  non-pressured   Thought Process: within normal limits   Thought Content no hallucinations and no delusions   Suicidal ideations none   Mood:  within normal limits   Affect:  flat   Memory   adequate   Concentration:  adequate   Insight:  fair   Judgment:  fair       MEDICATIONS:  Current Facility-Administered Medications   Medication Dose Route Frequency    acetaminophen  (TYLENOL ) tablet  650 mg  650 mg Oral Q4H PRN    polyethylene glycol (GLYCOLAX ) packet 17 g  17 g Oral Daily PRN    senna (SENOKOT) tablet 8.6 mg  1 tablet Oral Daily PRN    aluminum & magnesium  hydroxide-simethicone (MAALOX PLUS) 200-200-20 MG/5ML suspension 30 mL  30 mL Oral Q6H PRN    hydrOXYzine  HCl (ATARAX ) tablet 50 mg  50 mg Oral TID PRN    haloperidol  (HALDOL ) tablet 5 mg  5 mg Oral Q4H PRN    Or    haloperidol  lactate (HALDOL ) injection 5 mg  5 mg IntraMUSCular Q4H PRN    diphenhydrAMINE  (BENADRYL ) injection 50 mg  50 mg IntraMUSCular Q4H PRN    traZODone  (DESYREL ) tablet 50 mg  50 mg Oral Nightly PRN    citalopram  (CELEXA ) tablet 20 mg  20 mg Oral Daily    divalproex  (DEPAKOTE ) DR tablet 500 mg  500 mg Oral BID    metoprolol  succinate (TOPROL  XL) extended release tablet 50 mg  50 mg Oral Daily    QUEtiapine  (SEROQUEL ) tablet 50 mg  50 mg Oral Nightly    thiamine  tablet 100 mg  100 mg Oral Daily    folic acid  (FOLVITE ) tablet 1 mg  1 mg Oral Daily    multivitamin 1 tablet  1 tablet Oral Daily      DISCUSSION:   the risks and benefits of the proposed medication    Lab/Data Review:  All lab results for the last 24 hours reviewed.    Recent Results (from the past 24 hours)   Valproic Acid Level, Total    Collection Time: 02/05/24  6:28 AM   Result Value Ref Range    Valproic Acid 29 (L) 50 - 100 ug/ml           Assessment:     Principal Problem:    Mood disorder  Resolved Problems:    * No resolved hospital problems. *      Plan:     Continue current care  Collateral information  Continue current medications  Disposition planning with social work    I certify that this patient's inpatient psychiatric hospital services furnished since the previous certification were, and continue to be, required for treatment that could reasonably be expected to improve the patient's condition, or for diagnostic study, and that the patient continues to need, on a daily basis, active treatment furnished directly by or requiring the supervision of  inpatient psychiatric facility personnel. In addition, the hospital records show that services furnished were intensive treatment services, admission or related services, or equivalent services.  Signed By: Ezzard ONEIDA Hun, ACNP     February 05, 2024      "

## 2024-02-05 NOTE — Plan of Care (Signed)
"    Problem: Depression  Goal: Will be euthymic at discharge  Description: INTERVENTIONS:  1. Administer medication as ordered  2. Provide emotional support via 1:1 interaction with staff  3. Encourage involvement in milieu/groups/activities  4. Monitor for social isolation  02/05/2024 1951 by Glendia Harlene CROME, RN  Outcome: Progressing     Problem: Drug Abuse/Detox  Goal: Will have no detox symptoms and will verbalize plan for changing drug-related behavior  Description: INTERVENTIONS:  1. Administer medication as ordered  2. Monitor physical status  3. Provide emotional support with 1:1 interaction with staff  4. Encourage  recovery focused treatment   02/05/2024 1951 by Glendia Harlene CROME, RN  Outcome: Progressing     Problem: Safety - Adult  Goal: Free from fall injury  Outcome: Progressing     Pt visible out on unit interacting with peers. Respirations are even and unlabored. Pt has no visible/voiced signs of distress or discomfort. Walkways are free and clear from clutter. Gait is steady. Affect is bright. Plan of care is ongoing.  "

## 2024-02-06 MED FILL — DIVALPROEX SODIUM 500 MG PO TBEC: 500 mg | ORAL | Qty: 1 | Fill #0

## 2024-02-06 MED FILL — QUETIAPINE FUMARATE 25 MG PO TABS: 25 mg | ORAL | Qty: 2 | Fill #0

## 2024-02-06 MED FILL — CITALOPRAM HYDROBROMIDE 20 MG PO TABS: 20 mg | ORAL | Qty: 1 | Fill #0

## 2024-02-06 MED FILL — METOPROLOL SUCCINATE ER 25 MG PO TB24: 25 mg | ORAL | Qty: 2 | Fill #0

## 2024-02-06 MED FILL — THERA PO TABS: ORAL | Qty: 1 | Fill #0

## 2024-02-06 MED FILL — THIAMINE HCL 100 MG PO TABS: 100 mg | ORAL | Qty: 1 | Fill #0

## 2024-02-06 MED FILL — FOLIC ACID 1 MG PO TABS: 1 mg | ORAL | Qty: 1 | Fill #0

## 2024-02-06 NOTE — Plan of Care (Signed)
"    Problem: Drug Abuse/Detox  Goal: Will have no detox symptoms and will verbalize plan for changing drug-related behavior  Description: INTERVENTIONS:  1. Administer medication as ordered  2. Monitor physical status  3. Provide emotional support with 1:1 interaction with staff  4. Encourage  recovery focused treatment   02/06/2024 0910 by Rudy Rocky PARAS, RN  Outcome: Progressing  Flowsheets (Taken 02/06/2024 [phone])  Will have no detox symptoms and will verbalize plan for changing drug-related behavior: Provide emotional support with 1:1 interaction with staff  Note: Pt out on the unit interacting with select peers. Mood has improved. Focused on discharge for today. Denies SI/HI at this time.     "

## 2024-02-06 NOTE — Progress Notes (Signed)
"  Pharmacist Discharge Medication Reconciliation    Discharging Provider: Dr. Zavage    Significant PMH:   Past Medical History:   Diagnosis Date    A-fib [facility])     Abuse, drug or alcohol (HCC)     alcohol abuse    Depression      Chief Complaint for this Admission: No chief complaint on file.    Allergies: Aspirin    Discharge Medications:      Medication List        START taking these medications      hydrOXYzine  HCl 50 MG tablet  Commonly known as: ATARAX   Take 1 tablet by mouth 3 times daily as needed for Anxiety            CONTINUE taking these medications      citalopram  20 MG tablet  Commonly known as: CELEXA   Take 1 tablet by mouth daily     divalproex  500 MG DR tablet  Commonly known as: DEPAKOTE   Take 1 tablet by mouth in the morning and at bedtime     metoprolol  succinate 50 MG extended release tablet  Commonly known as: TOPROL  XL  Take 1 tablet by mouth daily     QUEtiapine  50 MG tablet  Commonly known as: SEROQUEL   Take 1 tablet by mouth nightly     traZODone  50 MG tablet  Commonly known as: DESYREL   Take 1 tablet by mouth nightly as needed for Sleep            STOP taking these medications      naltrexone  50 MG tablet  Commonly known as: DEPADE               Where to Get Your Medications        These medications were sent to [facility] - [place], [place] - [address] - P [phone] [address] [phone]  [address] [place] [address]      Phone: [phone]   citalopram  20 MG tablet  divalproex  500 MG DR tablet  hydrOXYzine  HCl 50 MG tablet  metoprolol  succinate 50 MG extended release tablet  QUEtiapine  50 MG tablet  traZODone  50 MG tablet         The patient's chart, MAR and AVS were reviewed by Isaiah Mainland, RPH.  "

## 2024-02-06 NOTE — Interdisciplinary Rounds (Addendum)
"    Behavioral Health Interdisciplinary Rounds     Patient Name: Bradley [name]  Age: 58 y.o.  Room/Bed:  725/01  Primary Diagnosis: Mood disorder   Admission Status: Voluntary    Readmission within 30 days: No  Power of Attorney in place: No  Patient requires a blocked bed: No          Reason for blocked bed:   Sleep hours: 7+      Flu Vaccine:   Participation in Care/Groups:  Yes  Medication Compliant?: Yes  PRNS (last 24 hours): None   Restraints (last 24 hours):  No  __________________________________________________  OQ Admission Analysis Survey completed:  OQ Admission Analysis Survey score:  __________________________________________________     Alcohol screening (AUDIT) completed -     If applicable, date SBIRT discussed in treatment team AND documented:    Tobacco - patient is a smoker:    Illegal Drugs use:      24 hour chart check complete: Yes    _______________________________________________    Patient goal(s) for today:   Treatment team focus/goals:   Progress note:      Spiritual Care Consult:   Financial concerns/prescription coverage:    Family contact:                        Family requesting physician contact today:    Discharge plan:   Access to weapons :                                                              Outpatient provider(s):   Patient's preferred phone number for follow up call :   Patient's preferred e-mail address :    LOS:  5  Expected LOS:     Participating treatment team members: Bradley [name], * (assigned SW),        "

## 2024-02-06 NOTE — BH Note (Signed)
 "Behavioral Health Transition Record    Patient Name: Bradley [name]  Date of Birth: 02/27/1966   Medical Record Number: [id number]  Date of Admission: 02/01/2024  7:55 AM   Date of Discharge: 02/06/2024    Attending Provider: No att. providers found   Discharging Provider: Dr. Michelle Zavage    To contact this individual call [phone] and ask the operator to page.  If unavailable, ask to be transferred to [facility] Provider on call.  A Behavioral Health Provider will be available on call 24/7 and during holidays.    Primary Care Provider: No primary care provider on file.    Allergies   Allergen Reactions    Aspirin Nausea And Vomiting       Reason for Admission: CHIEF COMPLAINT:  suicidal ideation     History obtained from:  patient, electronic medical record     HISTORY OF PRESENT ILLNESS:    Bradley [name] is a 58 year old male who is admitted to the [facility] after expressing suicidal ideation in the context of relationship conflict and alcohol use. He reports that a bad situation occurred between him and his girlfriend, which he took a little bit harder, leading to drinking and getting a little crazy. During this episode, he endorsed having a plan to harm himself, specifically stating he could take a 40 and break it, and cut my own wrists. He was subsequently brought to the hospital, transferred from [facility] to [facility], and then to the current facility, arriving around 7:30 PM the previous evening.     The patient reports he has not been sober since his last hospitalization discharge, which occurred approximately one week prior. He acknowledges poor medication adherence, stating his medications kind of disappeared and that he took them consistently for only about 2 days after his last discharge. He is prescribed Metoprolol  and Naltrexone  but has not been taking them regularly. When asked about a time when he took medications regularly without drinking, he responded it's been a long  time.     The patient expresses openness to entering a treatment program. He reports that his experience at [facility] was mixed - good in a way, but actually it was bad in other ways - due to mandatory meeting requirements that conflicted with his work schedule, ultimately leading to his discharge from the program despite having what he described as a good job.       Admission Diagnosis: Mood disorder [F39]    * No surgery found *    Results for orders placed or performed during the hospital encounter of 02/01/24   Lipid Panel   Result Value Ref Range    Cholesterol, Total 172 0 - 200 MG/DL    Triglycerides 834 (H) 0 - 150 MG/DL    HDL 69 (H) 40 - 60 MG/DL    LDL Cholesterol 70 0 - 100 MG/DL    VLDL Cholesterol Calculated 33 MG/DL    Chol/HDL Ratio 2.5 0.0 - 5.0     Valproic Acid Level, Total   Result Value Ref Range    Valproic Acid 29 (L) 50 - 100 ug/ml   EKG 12 lead   Result Value Ref Range    Ventricular Rate 69 BPM    Atrial Rate 69 BPM    P-R Interval 150 ms    QRS Duration 88 ms    Q-T Interval 384 ms    QTc Calculation (Bazett) 411 ms    P Axis 56 degrees  R Axis 72 degrees    T Axis 61 degrees    Diagnosis       Normal sinus rhythm  Nonspecific ST abnormality    When compared with ECG of 31-Jan-2024 19:59,  No significant change was found  Confirmed by Alena, M.D., Jayson [phone]) on 02/01/2024 2:22:38 PM         Immunizations administered during this encounter:   There is no immunization history on file for this patient.  Influenza Vaccination Status: None of the above/Not documented/Unable to determine from medical record documentation    Screening for Metabolic Disorders for Patients on Antipsychotic Medications  (Data obtained from the EMR)    Estimated Body Mass Index  Body mass index is 27.1 kg/m.      Vital Signs/Blood Pressure  BP 134/64   Pulse 67   Temp 97.3 F (36.3 C) (Oral)   Resp 20   Ht 1.702 m (5' 7)   Wt 78.5 kg (173 lb)   SpO2 98%   BMI 27.10 kg/m      Fasting Blood  Glucose or Hemoglobin A1c  No results found for: GLU, GLUCPOC    Hemoglobin A1C   Date Value Ref Range Status   01/20/2024 5.4 4.0 - 5.6 % Final     Comment:     Reference Range  Normal       <5.7%  Prediabetes  5.7-6.4%  Diabetes     >6.4%         Discharge Diagnosis: Bipolar Disorder, MRE depressed  Alcohol Use Disorder, severe    Discharge Plan/Destination: The patient Bradley [name] exhibits the ability to control behavior in a less restrictive environment.  Patient's level of functioning is improving.  No assaultive/destructive behavior has been observed for the past 24 hours.  No suicidal/homicidal threat or behavior has been observed for the past 24 hours.  There is no evidence of serious medication side effects.  Patient has not been in physical or protective restraints for at least the past 24 hours.    If weapons involved, how are they secured? None    Is patient aware of and in agreement with discharge plan? Yes    Arrangements for medication:  Prescriptions filled through [facility], 30 day supply provided    Copy of discharge instructions to provider?:  Yes    Arrangements for transportation home: [facility] to arrange transportation    Keep all follow up appointments as scheduled, continue to take prescribed medications per physician instructions.  Mental health crisis number:  911 or your local mental health crisis line number at [facility] at [phone].    Discharge Medication List and Instructions:      Medication List        START taking these medications      hydrOXYzine  HCl 50 MG tablet  Commonly known as: ATARAX   Take 1 tablet by mouth 3 times daily as needed for Anxiety            CONTINUE taking these medications      citalopram  20 MG tablet  Commonly known as: CELEXA   Take 1 tablet by mouth daily     divalproex  500 MG DR tablet  Commonly known as: DEPAKOTE   Take 1 tablet by mouth in the morning and at bedtime     metoprolol  succinate 50 MG extended release  tablet  Commonly known as: TOPROL  XL  Take 1 tablet by mouth daily     QUEtiapine  50 MG tablet  Commonly known as: SEROQUEL   Take 1 tablet by mouth nightly     traZODone  50 MG tablet  Commonly known as: DESYREL   Take 1 tablet by mouth nightly as needed for Sleep            STOP taking these medications      naltrexone  50 MG tablet  Commonly known as: DEPADE               Where to Get Your Medications        These medications were sent to [facility] - [place], [place] - [address] - P [phone] - F [phone]  [address]  [place] [address]      Phone: [phone]   citalopram  20 MG tablet  divalproex  500 MG DR tablet  hydrOXYzine  HCl 50 MG tablet  metoprolol  succinate 50 MG extended release tablet  QUEtiapine  50 MG tablet  traZODone  50 MG tablet         Unresulted Labs (24h ago, onward)      None            To obtain results of studies pending at discharge, please contact [phone]    Follow-up Information       Follow up With Specialties Details Why Contact Info    [facility]  Go on 02/06/2024 Go to today for inpatient residential substance use recovery program [address]  [place], [place]  [address]    [phone]             Advanced Directive:   Does the patient have an appointed surrogate decision maker? No  Does the patient have a Medical Advance Directive? No  Does the patient have a Psychiatric Advance Directive? No  If the patient does not have a surrogate or Medical Advance Directive AND Psychiatric Advance Directive, the patient was offered information on these advance directives Patient declined to complete    Patient Instructions: Please continue all medications until otherwise directed by physician.    Tobacco Cessation Discharge Plan:   Is the patient a tobacco user  and needs referral for tobacco cessation? Yes  Patient referred to the following for tobacco cessation with an appointment? Patient refused  Patient was offered medication to assist with tobacco  cessation at discharge? Patient refused    Alcohol/Substance Abuse Discharge Plan:   Does the patient have a history of substance/alcohol abuse and requires a referral for treatment? Yes  Patient referred to the following for substance/alcohol abuse treatment with an appointment? Yes  Patient was offered medication to assist with substance/alcohol abuse cessation at discharge? Patient refused      Patient discharged to: Home; Transition record discussed with patient/caregiver and provided this record in hard copy or electronically                     "

## 2024-02-06 NOTE — Discharge Summary (Signed)
 "DISCHARGE SUMMARY    Some parts of the discharge summary are from the initial Psychiatric interview that was done on admission by the admitting psychiatrist.      Date of Admission: 02/01/2024    Date of Discharge:02/06/2024     TYPE OF DISCHARGE:   REGULAR -  YES    AMA - NO  RELEASED BY THE TDO COURT - NO    ADMISSION EVALUATION:  Bradley [name] is a 58 year old male who is admitted to the [facility] after expressing suicidal ideation in the context of relationship conflict and alcohol use. He reports that a bad situation occurred between him and his girlfriend, which he took a little bit harder, leading to drinking and getting a little crazy. During this episode, he endorsed having a plan to harm himself, specifically stating he could take a 40 and break it, and cut my own wrists. He was subsequently brought to the hospital, transferred from [place] to [facility], and then to the current facility, arriving around 7:30 PM the previous evening.     The patient reports he has not been sober since his last hospitalization discharge, which occurred approximately one week prior. He acknowledges poor medication adherence, stating his medications kind of disappeared and that he took them consistently for only about 2 days after his last discharge. He is prescribed Metoprolol  and Naltrexone  but has not been taking them regularly. When asked about a time when he took medications regularly without drinking, he responded it's been a long time.     The patient expresses openness to entering a treatment program. He reports that his experience at [facility] was mixed - good in a way, but actually it was bad in other ways - due to mandatory meeting requirements that conflicted with his work schedule, ultimately leading to his discharge from the program despite having what he described as a good job.      Course in the Hospital:     Patient was admitted to the inpatient psychiatry unit for acute psychiatric  stabilization in regards to symptomatology as described in the HPI above and placed on Q15 minute checks and withdrawal precautions. While on the unit Bradley [name] was involved in individual, group, occupational and milieu therapy. He was started back on his usual medication regimen as well as PRN medications including  . He improved gradually and was able to integrate into the milieu with help from the nursing staff. Patients symptoms improved gradually including improvement in mood and participation. He was quite on the unit, appropriate in his interactions, and cooperative with medications and the unit routine. Please see individual progress notes for more specific details regarding patient's hospitalization course. Patient was discharged as per the plan. He had been doing well on the unit as per the report of the nursing staff and my observations. No PRN medication for agitation, seclusion or restraints were required during the last 48 hours of his stay. Bradley [name] had improved progressively to the point of being stable for discharge and outpatient FU. At this time he did not offer any complaints. Patient denied any SI or HI. Denied any AH or VH. He denied any delusions. Was not considered a danger to self or to others and is safe for discharge. Will FU with his appointments and remains motivated to be in treatment. The patient verbalized understanding of his discharge instructions.        DISCHARGE DIAGNOSIS:         Medication  List        CONTINUE taking these medications      citalopram  20 MG tablet  Commonly known as: CELEXA   Take 1 tablet by mouth daily     divalproex  500 MG DR tablet  Commonly known as: DEPAKOTE   Take 1 tablet by mouth in the morning and at bedtime     metoprolol  succinate 50 MG extended release tablet  Commonly known as: TOPROL  XL  Take 1 tablet by mouth daily     QUEtiapine  50 MG tablet  Commonly known as: SEROQUEL   Take 1 tablet by mouth nightly     traZODone  50 MG  tablet  Commonly known as: DESYREL   Take 1 tablet by mouth nightly as needed for Sleep            STOP taking these medications      naltrexone  50 MG tablet  Commonly known as: DEPADE            ASK your doctor about these medications      hydrOXYzine  HCl 50 MG tablet  Commonly known as: ATARAX   Take 1 tablet by mouth 3 times daily as needed for Anxiety  Ask about: Should I take this medication?               Where to Get Your Medications        These medications were sent to [facility] - [place], [place] - [address] - P [phone] - F [phone]  [address]  [place] [address]      Phone: [phone]   citalopram  20 MG tablet  divalproex  500 MG DR tablet  hydrOXYzine  HCl 50 MG tablet  metoprolol  succinate 50 MG extended release tablet  QUEtiapine  50 MG tablet  traZODone  50 MG tablet        Lab Results   Component Value Date/Time    VALAC 29 02/05/2024 06:28 AM     No results found for: LITHM  @FOLLOWUPSECTION @  WOUND CARE: none needed.    PROGNOSIS:   Good  based on nature of patient's pathology/ies and treatment compliance issues.  Prognosis is greatly dependent upon patient's ability to  follow up on psychiatric/psychotherapy appointments as well as to comply with psychiatric medications as prescribed.      "

## 2024-02-07 ENCOUNTER — Ambulatory Visit: Payer: Medicaid (Managed Care)

## 2024-02-07 NOTE — Progress Notes (Deleted)
 "      Cardiac Electrophysiology      History of Present Illness:      Bradley [name] is a 58 y.o. male with a history of alcohol use, bipolar, seizure, paroxysmal atrial fibrillation who presents today to establish care for atrial fibrillation.      History of Present Illness        Denies syncope. Denies chest pain. Denies shortness of breath, paroxysmal nocturnal dyspnea, orthopnea and lower extremity edema***.    Cardiac Rhythm Testing     All EKGs were personally interpreted by me as below.    ***    Current Medications:      Current Outpatient Medications   Medication Instructions    citalopram  (CELEXA ) 20 mg, Oral, DAILY    divalproex  (DEPAKOTE ) 500 mg, Oral, 2 times daily    hydrOXYzine  HCl (ATARAX ) 50 mg, Oral, 3 TIMES DAILY PRN    metoprolol  succinate (TOPROL  XL) 50 mg, Oral, DAILY    QUEtiapine  (SEROQUEL ) 50 mg, Oral, NIGHTLY    traZODone  (DESYREL ) 50 mg, Oral, NIGHTLY PRN       Problem List and Other Histories:      Patient Active Problem List   Diagnosis    Major depression, single episode    Depression with suicidal ideation    Atrial fibrillation with RVR (HCC)    Syncope and collapse    Breakthrough seizure (HCC)    Alcohol intoxication in active alcoholic with delirium (HCC)    Suicidal behavior with attempted self-injury (HCC)    Homicidal ideation    Alcohol use disorder    Mood disorder     Past Medical History:   Diagnosis Date    A-fib (HCC)     Abuse, drug or alcohol (HCC)     alcohol abuse    Depression      No past surgical history on file.  No family history on file.  Social History     Tobacco Use    Smoking status: Every Day     Current packs/day: 1.00     Average packs/day: 1 pack/day for 10.9 years (10.9 ttl pk-yrs)     Types: Cigarettes     Start date: 2015     Passive exposure: Current   Substance Use Topics    Alcohol use: Yes     Alcohol/week: 20.0 - 42.0 standard drinks of alcohol     Types: 20 - 42 Standard drinks or equivalent per week    Drug use: Not Currently        I have  personally reviewed the patient's past histories with updates made to electronic medical record as appropriate and pertinent findings discussed above in the history.    Review of Systems:   12 point review of systems was performed. All negative except for HPI/above.     Objective:   There were no vitals taken for this visit.   Wt Readings from Last 3 Encounters:   01/31/24 74.8 kg (165 lb)   01/17/24 81.2 kg (179 lb)   12/17/23 77.1 kg (170 lb)     Ht Readings from Last 1 Encounters:   01/31/24 1.702 m (5' 7)      There is no height or weight on file to calculate BMI.    PHYSICAL EXAM  General:  {GenExam:74613::Alert and oriented x3, in no acute distress}  Head:  Atraumatic, normocephalic  Eyes:  extraocular muscles intact  Neck:  Supple, normal range of motion  Mouth: {MMM:74539::Moist  mucous membranes}  Cardiovascular: {Rate:74525::Normal Rate}, {Rhythm:74526::Regular}, Normal radial pulses. Normal S1 and S2.  Lungs:  {LUNGEXAM:74540::Clear to auscultation bilaterally}, Non-labored with symmetric rise and fall with respiration.  Abdomen:  Soft, nontender, nondistended, normoactive bowel sounds  Skin:  Intact, no rash  Extremities: no clubbing or cyanosis. {LEedema:74551::No lower extremity edema bilaterally}  Musculoskeletal: normal range of motion  Neurological:  Alert and oriented, no focal neurologic deficits  Psychiatric:  Normal mood and affect    Cardiac Tests, Imaging and Labs Data:  01/17/24    ECHO (TTE) COMPLETE (PRN CONTRAST/BUBBLE/STRAIN/3D) 01/19/2024  1:30 PM (Final)    Interpretation Summary    Left Ventricle: Normal left ventricular systolic function with a visually estimated EF of 55 - 60%. Left ventricle size is normal. Normal wall thickness. Normal wall motion. Abnormal diastolic function.    Right Ventricle: Right ventricle size is normal. Normal systolic function. TAPSE is normal.  TAPSE is 2.3 cm.    Aortic Valve: Mild sclerosis of the aortic valve cusps. No regurgitation. No  stenosis.  AV Peak Gradient is 15 mmHg. AV Mean Gradient is 8 mmHg. AV Peak Velocity is 1.9 m/s.    Mitral Valve: Mild regurgitation.    Tricuspid Valve: Mild regurgitation.    Image quality is adequate.    Signed by: Karna CHRISTELLA Pyo, MD on 01/19/2024  1:30 PM    No results found for this or any previous visit.    No results found for this or any previous visit.      No valid procedures specified.  No valid procedures specified.  No valid procedures specified.  @LASTCARDIOLOGY @    LA Diameter   Date Value Ref Range Status   01/19/2024 3.7 cm Final     LA Volume BP   Date Value Ref Range Status   01/19/2024 60 (A) 18 - 58 mL Final     LA Volume Index BP   Date Value Ref Range Status   01/19/2024 30 16 - 34 ml/m2 Final       No results found for: PFTINTERP, PFTTST, DLCO, DLCOPCTPREPR    Lab Results   Component Value Date    CREATININE 0.67 (L) 01/31/2024    CREATININE 0.80 01/20/2024    CREATININE 0.79 01/19/2024    CREATININE 0.76 01/18/2024    CREATININE 0.49 (L) 01/17/2024    K 4.0 01/31/2024    K 3.8 01/20/2024    K 3.7 01/19/2024    MG 2.0 01/20/2024    MG 2.3 01/17/2024    MG 2.4 12/17/2023           Assessment and Plan:   No diagnosis found.  Assessment & Plan        There are no Patient Instructions on file for this visit.   No follow-ups on file.       Thank you for involving me in this patient's care and please call with further concerns or questions.    I independently reviewed internal and external records of most recent labs, EKGs, event monitors or Holters, and imaging including most recent echocardiograms and stress test. Ordered follow up testing as indicated in orders and patient instructions. Previous notes from consultants and PCP are reviewed as well as pertinent prior admission documents. Time was spent with patient education, counseling, review of external records and care coordination independent of time spent on EMR.  ________________________________________  Sincerely, Lang SAILOR.  Clydell, M.D.  [facility] [place]  [facility]   Department of Cardiac  Electrophysiology                                    "
